# Patient Record
Sex: Male | Born: 1950 | Race: White | Hispanic: No | Marital: Married | Smoking: Former smoker
Health system: Southern US, Community
[De-identification: ages and names within clinical notes are randomized; demographics above are authoritative.]

## PROBLEM LIST (undated history)

## (undated) DIAGNOSIS — M545 Low back pain, unspecified: Secondary | ICD-10-CM

## (undated) DIAGNOSIS — K219 Gastro-esophageal reflux disease without esophagitis: Secondary | ICD-10-CM

## (undated) DIAGNOSIS — M25569 Pain in unspecified knee: Secondary | ICD-10-CM

## (undated) DIAGNOSIS — E785 Hyperlipidemia, unspecified: Secondary | ICD-10-CM

## (undated) DIAGNOSIS — I10 Essential (primary) hypertension: Secondary | ICD-10-CM

## (undated) DIAGNOSIS — B019 Varicella without complication: Secondary | ICD-10-CM

## (undated) DIAGNOSIS — G8929 Other chronic pain: Secondary | ICD-10-CM

## (undated) DIAGNOSIS — I2510 Atherosclerotic heart disease of native coronary artery without angina pectoris: Secondary | ICD-10-CM

## (undated) DIAGNOSIS — E349 Endocrine disorder, unspecified: Secondary | ICD-10-CM

## (undated) DIAGNOSIS — G4733 Obstructive sleep apnea (adult) (pediatric): Secondary | ICD-10-CM

## (undated) DIAGNOSIS — C801 Malignant (primary) neoplasm, unspecified: Secondary | ICD-10-CM

## (undated) DIAGNOSIS — E119 Type 2 diabetes mellitus without complications: Secondary | ICD-10-CM

## (undated) DIAGNOSIS — F32A Depression, unspecified: Secondary | ICD-10-CM

## (undated) DIAGNOSIS — M1990 Unspecified osteoarthritis, unspecified site: Secondary | ICD-10-CM

## (undated) DIAGNOSIS — F419 Anxiety disorder, unspecified: Secondary | ICD-10-CM

## (undated) HISTORY — DX: Essential (primary) hypertension: I10

## (undated) HISTORY — DX: Gastro-esophageal reflux disease without esophagitis: K21.9

## (undated) HISTORY — DX: Other chronic pain: G89.29

## (undated) HISTORY — DX: Varicella without complication: B01.9

## (undated) HISTORY — DX: Low back pain: M54.5

## (undated) HISTORY — DX: Obstructive sleep apnea (adult) (pediatric): G47.33

## (undated) HISTORY — DX: Hyperlipidemia, unspecified: E78.5

## (undated) HISTORY — PX: JOINT REPLACEMENT: SHX530

## (undated) HISTORY — DX: Low back pain, unspecified: M54.50

## (undated) HISTORY — DX: Endocrine disorder, unspecified: E34.9

## (undated) HISTORY — DX: Type 2 diabetes mellitus without complications: E11.9

## (undated) HISTORY — DX: Atherosclerotic heart disease of native coronary artery without angina pectoris: I25.10

## (undated) HISTORY — DX: Pain in unspecified knee: M25.569

## (undated) MED FILL — Dexamethasone Sodium Phosphate Inj 100 MG/10ML: INTRAMUSCULAR | Qty: 1 | Status: AC

---
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
## 2013-10-24 LAB — HM COLONOSCOPY

## 2013-12-15 HISTORY — PX: REPLACEMENT TOTAL KNEE BILATERAL: SUR1225

## 2014-04-05 DIAGNOSIS — E230 Hypopituitarism: Secondary | ICD-10-CM | POA: Insufficient documentation

## 2014-04-27 DIAGNOSIS — Z9889 Other specified postprocedural states: Secondary | ICD-10-CM | POA: Insufficient documentation

## 2015-12-18 DIAGNOSIS — E119 Type 2 diabetes mellitus without complications: Secondary | ICD-10-CM | POA: Diagnosis not present

## 2015-12-18 DIAGNOSIS — I10 Essential (primary) hypertension: Secondary | ICD-10-CM | POA: Diagnosis not present

## 2015-12-18 DIAGNOSIS — R5383 Other fatigue: Secondary | ICD-10-CM | POA: Diagnosis not present

## 2015-12-18 DIAGNOSIS — Z720 Tobacco use: Secondary | ICD-10-CM | POA: Diagnosis not present

## 2015-12-21 DIAGNOSIS — M9903 Segmental and somatic dysfunction of lumbar region: Secondary | ICD-10-CM | POA: Diagnosis not present

## 2015-12-21 DIAGNOSIS — M47817 Spondylosis without myelopathy or radiculopathy, lumbosacral region: Secondary | ICD-10-CM | POA: Diagnosis not present

## 2015-12-25 DIAGNOSIS — M47817 Spondylosis without myelopathy or radiculopathy, lumbosacral region: Secondary | ICD-10-CM | POA: Diagnosis not present

## 2015-12-25 DIAGNOSIS — M9903 Segmental and somatic dysfunction of lumbar region: Secondary | ICD-10-CM | POA: Diagnosis not present

## 2015-12-28 DIAGNOSIS — M9903 Segmental and somatic dysfunction of lumbar region: Secondary | ICD-10-CM | POA: Diagnosis not present

## 2015-12-28 DIAGNOSIS — M47817 Spondylosis without myelopathy or radiculopathy, lumbosacral region: Secondary | ICD-10-CM | POA: Diagnosis not present

## 2015-12-31 DIAGNOSIS — M47817 Spondylosis without myelopathy or radiculopathy, lumbosacral region: Secondary | ICD-10-CM | POA: Diagnosis not present

## 2015-12-31 DIAGNOSIS — M9903 Segmental and somatic dysfunction of lumbar region: Secondary | ICD-10-CM | POA: Diagnosis not present

## 2016-01-04 DIAGNOSIS — M47817 Spondylosis without myelopathy or radiculopathy, lumbosacral region: Secondary | ICD-10-CM | POA: Diagnosis not present

## 2016-01-04 DIAGNOSIS — M9903 Segmental and somatic dysfunction of lumbar region: Secondary | ICD-10-CM | POA: Diagnosis not present

## 2016-01-07 DIAGNOSIS — M47817 Spondylosis without myelopathy or radiculopathy, lumbosacral region: Secondary | ICD-10-CM | POA: Diagnosis not present

## 2016-01-07 DIAGNOSIS — M9903 Segmental and somatic dysfunction of lumbar region: Secondary | ICD-10-CM | POA: Diagnosis not present

## 2016-01-10 DIAGNOSIS — M9903 Segmental and somatic dysfunction of lumbar region: Secondary | ICD-10-CM | POA: Diagnosis not present

## 2016-01-10 DIAGNOSIS — M47817 Spondylosis without myelopathy or radiculopathy, lumbosacral region: Secondary | ICD-10-CM | POA: Diagnosis not present

## 2016-01-17 DIAGNOSIS — M9903 Segmental and somatic dysfunction of lumbar region: Secondary | ICD-10-CM | POA: Diagnosis not present

## 2016-01-17 DIAGNOSIS — M47817 Spondylosis without myelopathy or radiculopathy, lumbosacral region: Secondary | ICD-10-CM | POA: Diagnosis not present

## 2016-01-24 DIAGNOSIS — M9903 Segmental and somatic dysfunction of lumbar region: Secondary | ICD-10-CM | POA: Diagnosis not present

## 2016-01-24 DIAGNOSIS — M47817 Spondylosis without myelopathy or radiculopathy, lumbosacral region: Secondary | ICD-10-CM | POA: Diagnosis not present

## 2016-01-31 DIAGNOSIS — M47817 Spondylosis without myelopathy or radiculopathy, lumbosacral region: Secondary | ICD-10-CM | POA: Diagnosis not present

## 2016-01-31 DIAGNOSIS — M9903 Segmental and somatic dysfunction of lumbar region: Secondary | ICD-10-CM | POA: Diagnosis not present

## 2016-02-08 DIAGNOSIS — M9903 Segmental and somatic dysfunction of lumbar region: Secondary | ICD-10-CM | POA: Diagnosis not present

## 2016-02-08 DIAGNOSIS — M47817 Spondylosis without myelopathy or radiculopathy, lumbosacral region: Secondary | ICD-10-CM | POA: Diagnosis not present

## 2016-02-13 DIAGNOSIS — M9903 Segmental and somatic dysfunction of lumbar region: Secondary | ICD-10-CM | POA: Diagnosis not present

## 2016-02-13 DIAGNOSIS — M47817 Spondylosis without myelopathy or radiculopathy, lumbosacral region: Secondary | ICD-10-CM | POA: Diagnosis not present

## 2016-02-13 DIAGNOSIS — I6523 Occlusion and stenosis of bilateral carotid arteries: Secondary | ICD-10-CM | POA: Diagnosis not present

## 2016-02-13 DIAGNOSIS — R0989 Other specified symptoms and signs involving the circulatory and respiratory systems: Secondary | ICD-10-CM | POA: Diagnosis not present

## 2016-02-18 DIAGNOSIS — G4733 Obstructive sleep apnea (adult) (pediatric): Secondary | ICD-10-CM | POA: Diagnosis not present

## 2016-02-21 DIAGNOSIS — M47817 Spondylosis without myelopathy or radiculopathy, lumbosacral region: Secondary | ICD-10-CM | POA: Diagnosis not present

## 2016-02-21 DIAGNOSIS — M9903 Segmental and somatic dysfunction of lumbar region: Secondary | ICD-10-CM | POA: Diagnosis not present

## 2016-02-28 DIAGNOSIS — M47817 Spondylosis without myelopathy or radiculopathy, lumbosacral region: Secondary | ICD-10-CM | POA: Diagnosis not present

## 2016-02-28 DIAGNOSIS — M9903 Segmental and somatic dysfunction of lumbar region: Secondary | ICD-10-CM | POA: Diagnosis not present

## 2016-03-06 DIAGNOSIS — M47817 Spondylosis without myelopathy or radiculopathy, lumbosacral region: Secondary | ICD-10-CM | POA: Diagnosis not present

## 2016-03-06 DIAGNOSIS — M9903 Segmental and somatic dysfunction of lumbar region: Secondary | ICD-10-CM | POA: Diagnosis not present

## 2016-03-13 DIAGNOSIS — M47817 Spondylosis without myelopathy or radiculopathy, lumbosacral region: Secondary | ICD-10-CM | POA: Diagnosis not present

## 2016-03-13 DIAGNOSIS — M9903 Segmental and somatic dysfunction of lumbar region: Secondary | ICD-10-CM | POA: Diagnosis not present

## 2016-03-20 DIAGNOSIS — M47817 Spondylosis without myelopathy or radiculopathy, lumbosacral region: Secondary | ICD-10-CM | POA: Diagnosis not present

## 2016-03-20 DIAGNOSIS — M9903 Segmental and somatic dysfunction of lumbar region: Secondary | ICD-10-CM | POA: Diagnosis not present

## 2016-03-27 DIAGNOSIS — M47817 Spondylosis without myelopathy or radiculopathy, lumbosacral region: Secondary | ICD-10-CM | POA: Diagnosis not present

## 2016-03-27 DIAGNOSIS — M9903 Segmental and somatic dysfunction of lumbar region: Secondary | ICD-10-CM | POA: Diagnosis not present

## 2016-04-03 DIAGNOSIS — M9903 Segmental and somatic dysfunction of lumbar region: Secondary | ICD-10-CM | POA: Diagnosis not present

## 2016-04-03 DIAGNOSIS — M47817 Spondylosis without myelopathy or radiculopathy, lumbosacral region: Secondary | ICD-10-CM | POA: Diagnosis not present

## 2016-04-10 DIAGNOSIS — M47817 Spondylosis without myelopathy or radiculopathy, lumbosacral region: Secondary | ICD-10-CM | POA: Diagnosis not present

## 2016-04-10 DIAGNOSIS — M9903 Segmental and somatic dysfunction of lumbar region: Secondary | ICD-10-CM | POA: Diagnosis not present

## 2016-04-11 DIAGNOSIS — E1121 Type 2 diabetes mellitus with diabetic nephropathy: Secondary | ICD-10-CM | POA: Diagnosis not present

## 2016-04-11 DIAGNOSIS — F321 Major depressive disorder, single episode, moderate: Secondary | ICD-10-CM | POA: Diagnosis not present

## 2016-04-11 DIAGNOSIS — Z87891 Personal history of nicotine dependence: Secondary | ICD-10-CM | POA: Diagnosis not present

## 2016-04-11 DIAGNOSIS — I10 Essential (primary) hypertension: Secondary | ICD-10-CM | POA: Diagnosis not present

## 2016-04-11 DIAGNOSIS — Z125 Encounter for screening for malignant neoplasm of prostate: Secondary | ICD-10-CM | POA: Diagnosis not present

## 2016-06-03 DIAGNOSIS — E119 Type 2 diabetes mellitus without complications: Secondary | ICD-10-CM | POA: Diagnosis not present

## 2016-06-03 DIAGNOSIS — E1121 Type 2 diabetes mellitus with diabetic nephropathy: Secondary | ICD-10-CM | POA: Diagnosis not present

## 2016-06-03 DIAGNOSIS — Z87891 Personal history of nicotine dependence: Secondary | ICD-10-CM | POA: Diagnosis not present

## 2016-06-03 DIAGNOSIS — E875 Hyperkalemia: Secondary | ICD-10-CM | POA: Diagnosis not present

## 2016-06-03 DIAGNOSIS — I10 Essential (primary) hypertension: Secondary | ICD-10-CM | POA: Diagnosis not present

## 2016-06-03 DIAGNOSIS — E538 Deficiency of other specified B group vitamins: Secondary | ICD-10-CM | POA: Diagnosis not present

## 2016-06-10 DIAGNOSIS — I10 Essential (primary) hypertension: Secondary | ICD-10-CM | POA: Diagnosis not present

## 2016-06-18 DIAGNOSIS — Z471 Aftercare following joint replacement surgery: Secondary | ICD-10-CM | POA: Diagnosis not present

## 2016-06-18 DIAGNOSIS — Z96653 Presence of artificial knee joint, bilateral: Secondary | ICD-10-CM | POA: Diagnosis not present

## 2016-06-18 DIAGNOSIS — Z9889 Other specified postprocedural states: Secondary | ICD-10-CM | POA: Diagnosis not present

## 2016-06-24 DIAGNOSIS — I10 Essential (primary) hypertension: Secondary | ICD-10-CM | POA: Diagnosis not present

## 2016-06-24 DIAGNOSIS — L989 Disorder of the skin and subcutaneous tissue, unspecified: Secondary | ICD-10-CM | POA: Diagnosis not present

## 2016-07-16 DIAGNOSIS — G4733 Obstructive sleep apnea (adult) (pediatric): Secondary | ICD-10-CM | POA: Diagnosis not present

## 2016-08-11 DIAGNOSIS — M47817 Spondylosis without myelopathy or radiculopathy, lumbosacral region: Secondary | ICD-10-CM | POA: Diagnosis not present

## 2016-08-11 DIAGNOSIS — M9903 Segmental and somatic dysfunction of lumbar region: Secondary | ICD-10-CM | POA: Diagnosis not present

## 2016-08-14 DIAGNOSIS — M47817 Spondylosis without myelopathy or radiculopathy, lumbosacral region: Secondary | ICD-10-CM | POA: Diagnosis not present

## 2016-08-14 DIAGNOSIS — M9903 Segmental and somatic dysfunction of lumbar region: Secondary | ICD-10-CM | POA: Diagnosis not present

## 2016-08-20 DIAGNOSIS — M47817 Spondylosis without myelopathy or radiculopathy, lumbosacral region: Secondary | ICD-10-CM | POA: Diagnosis not present

## 2016-08-20 DIAGNOSIS — M9903 Segmental and somatic dysfunction of lumbar region: Secondary | ICD-10-CM | POA: Diagnosis not present

## 2016-08-22 DIAGNOSIS — M9903 Segmental and somatic dysfunction of lumbar region: Secondary | ICD-10-CM | POA: Diagnosis not present

## 2016-08-22 DIAGNOSIS — M47817 Spondylosis without myelopathy or radiculopathy, lumbosacral region: Secondary | ICD-10-CM | POA: Diagnosis not present

## 2016-08-25 DIAGNOSIS — M9903 Segmental and somatic dysfunction of lumbar region: Secondary | ICD-10-CM | POA: Diagnosis not present

## 2016-08-25 DIAGNOSIS — M47817 Spondylosis without myelopathy or radiculopathy, lumbosacral region: Secondary | ICD-10-CM | POA: Diagnosis not present

## 2016-08-27 DIAGNOSIS — Z1211 Encounter for screening for malignant neoplasm of colon: Secondary | ICD-10-CM | POA: Diagnosis not present

## 2016-08-27 DIAGNOSIS — R413 Other amnesia: Secondary | ICD-10-CM | POA: Diagnosis not present

## 2016-08-27 DIAGNOSIS — E291 Testicular hypofunction: Secondary | ICD-10-CM | POA: Diagnosis not present

## 2016-08-27 DIAGNOSIS — I10 Essential (primary) hypertension: Secondary | ICD-10-CM | POA: Diagnosis not present

## 2016-08-27 DIAGNOSIS — Z23 Encounter for immunization: Secondary | ICD-10-CM | POA: Diagnosis not present

## 2016-08-27 DIAGNOSIS — E1121 Type 2 diabetes mellitus with diabetic nephropathy: Secondary | ICD-10-CM | POA: Diagnosis not present

## 2016-08-27 DIAGNOSIS — Z87891 Personal history of nicotine dependence: Secondary | ICD-10-CM | POA: Diagnosis not present

## 2016-08-28 DIAGNOSIS — M9903 Segmental and somatic dysfunction of lumbar region: Secondary | ICD-10-CM | POA: Diagnosis not present

## 2016-08-28 DIAGNOSIS — M47817 Spondylosis without myelopathy or radiculopathy, lumbosacral region: Secondary | ICD-10-CM | POA: Diagnosis not present

## 2016-09-02 DIAGNOSIS — M47817 Spondylosis without myelopathy or radiculopathy, lumbosacral region: Secondary | ICD-10-CM | POA: Diagnosis not present

## 2016-09-02 DIAGNOSIS — M9903 Segmental and somatic dysfunction of lumbar region: Secondary | ICD-10-CM | POA: Diagnosis not present

## 2016-09-04 DIAGNOSIS — M47817 Spondylosis without myelopathy or radiculopathy, lumbosacral region: Secondary | ICD-10-CM | POA: Diagnosis not present

## 2016-09-04 DIAGNOSIS — M9903 Segmental and somatic dysfunction of lumbar region: Secondary | ICD-10-CM | POA: Diagnosis not present

## 2016-09-08 DIAGNOSIS — M9903 Segmental and somatic dysfunction of lumbar region: Secondary | ICD-10-CM | POA: Diagnosis not present

## 2016-09-08 DIAGNOSIS — M47817 Spondylosis without myelopathy or radiculopathy, lumbosacral region: Secondary | ICD-10-CM | POA: Diagnosis not present

## 2016-09-12 DIAGNOSIS — M47817 Spondylosis without myelopathy or radiculopathy, lumbosacral region: Secondary | ICD-10-CM | POA: Diagnosis not present

## 2016-09-12 DIAGNOSIS — M9903 Segmental and somatic dysfunction of lumbar region: Secondary | ICD-10-CM | POA: Diagnosis not present

## 2016-09-16 DIAGNOSIS — M47817 Spondylosis without myelopathy or radiculopathy, lumbosacral region: Secondary | ICD-10-CM | POA: Diagnosis not present

## 2016-09-16 DIAGNOSIS — M9903 Segmental and somatic dysfunction of lumbar region: Secondary | ICD-10-CM | POA: Diagnosis not present

## 2016-09-19 DIAGNOSIS — M47817 Spondylosis without myelopathy or radiculopathy, lumbosacral region: Secondary | ICD-10-CM | POA: Diagnosis not present

## 2016-09-19 DIAGNOSIS — M9903 Segmental and somatic dysfunction of lumbar region: Secondary | ICD-10-CM | POA: Diagnosis not present

## 2016-09-22 DIAGNOSIS — M47817 Spondylosis without myelopathy or radiculopathy, lumbosacral region: Secondary | ICD-10-CM | POA: Diagnosis not present

## 2016-09-22 DIAGNOSIS — M9903 Segmental and somatic dysfunction of lumbar region: Secondary | ICD-10-CM | POA: Diagnosis not present

## 2016-09-26 DIAGNOSIS — M9903 Segmental and somatic dysfunction of lumbar region: Secondary | ICD-10-CM | POA: Diagnosis not present

## 2016-09-26 DIAGNOSIS — M47817 Spondylosis without myelopathy or radiculopathy, lumbosacral region: Secondary | ICD-10-CM | POA: Diagnosis not present

## 2016-10-01 DIAGNOSIS — M47817 Spondylosis without myelopathy or radiculopathy, lumbosacral region: Secondary | ICD-10-CM | POA: Diagnosis not present

## 2016-10-01 DIAGNOSIS — M9903 Segmental and somatic dysfunction of lumbar region: Secondary | ICD-10-CM | POA: Diagnosis not present

## 2016-10-09 DIAGNOSIS — M47817 Spondylosis without myelopathy or radiculopathy, lumbosacral region: Secondary | ICD-10-CM | POA: Diagnosis not present

## 2016-10-09 DIAGNOSIS — M9903 Segmental and somatic dysfunction of lumbar region: Secondary | ICD-10-CM | POA: Diagnosis not present

## 2016-10-15 DIAGNOSIS — M47817 Spondylosis without myelopathy or radiculopathy, lumbosacral region: Secondary | ICD-10-CM | POA: Diagnosis not present

## 2016-10-15 DIAGNOSIS — M9903 Segmental and somatic dysfunction of lumbar region: Secondary | ICD-10-CM | POA: Diagnosis not present

## 2016-10-21 DIAGNOSIS — M9903 Segmental and somatic dysfunction of lumbar region: Secondary | ICD-10-CM | POA: Diagnosis not present

## 2016-10-21 DIAGNOSIS — M47817 Spondylosis without myelopathy or radiculopathy, lumbosacral region: Secondary | ICD-10-CM | POA: Diagnosis not present

## 2017-01-02 DIAGNOSIS — F321 Major depressive disorder, single episode, moderate: Secondary | ICD-10-CM | POA: Diagnosis not present

## 2017-01-02 DIAGNOSIS — I10 Essential (primary) hypertension: Secondary | ICD-10-CM | POA: Diagnosis not present

## 2017-01-02 DIAGNOSIS — E1121 Type 2 diabetes mellitus with diabetic nephropathy: Secondary | ICD-10-CM | POA: Diagnosis not present

## 2017-01-02 DIAGNOSIS — M1990 Unspecified osteoarthritis, unspecified site: Secondary | ICD-10-CM | POA: Diagnosis not present

## 2017-01-29 DIAGNOSIS — I10 Essential (primary) hypertension: Secondary | ICD-10-CM | POA: Diagnosis not present

## 2017-01-29 DIAGNOSIS — M19071 Primary osteoarthritis, right ankle and foot: Secondary | ICD-10-CM | POA: Diagnosis not present

## 2017-01-29 DIAGNOSIS — M799 Soft tissue disorder, unspecified: Secondary | ICD-10-CM | POA: Diagnosis not present

## 2017-01-29 DIAGNOSIS — S99921A Unspecified injury of right foot, initial encounter: Secondary | ICD-10-CM | POA: Diagnosis not present

## 2017-01-29 DIAGNOSIS — Z87891 Personal history of nicotine dependence: Secondary | ICD-10-CM | POA: Diagnosis not present

## 2017-01-29 DIAGNOSIS — S9781XA Crushing injury of right foot, initial encounter: Secondary | ICD-10-CM | POA: Diagnosis not present

## 2017-01-29 DIAGNOSIS — E119 Type 2 diabetes mellitus without complications: Secondary | ICD-10-CM | POA: Diagnosis not present

## 2017-01-29 DIAGNOSIS — Z888 Allergy status to other drugs, medicaments and biological substances status: Secondary | ICD-10-CM | POA: Diagnosis not present

## 2017-01-29 DIAGNOSIS — M7989 Other specified soft tissue disorders: Secondary | ICD-10-CM | POA: Diagnosis not present

## 2017-03-28 DIAGNOSIS — H66002 Acute suppurative otitis media without spontaneous rupture of ear drum, left ear: Secondary | ICD-10-CM | POA: Diagnosis not present

## 2017-03-28 DIAGNOSIS — J209 Acute bronchitis, unspecified: Secondary | ICD-10-CM | POA: Diagnosis not present

## 2017-05-01 DIAGNOSIS — R42 Dizziness and giddiness: Secondary | ICD-10-CM | POA: Diagnosis not present

## 2017-05-01 DIAGNOSIS — M6249 Contracture of muscle, multiple sites: Secondary | ICD-10-CM | POA: Diagnosis not present

## 2017-05-01 DIAGNOSIS — M5417 Radiculopathy, lumbosacral region: Secondary | ICD-10-CM | POA: Diagnosis not present

## 2017-05-01 DIAGNOSIS — R293 Abnormal posture: Secondary | ICD-10-CM | POA: Diagnosis not present

## 2017-05-01 DIAGNOSIS — I10 Essential (primary) hypertension: Secondary | ICD-10-CM | POA: Diagnosis not present

## 2017-05-01 DIAGNOSIS — M79672 Pain in left foot: Secondary | ICD-10-CM | POA: Diagnosis not present

## 2017-05-01 DIAGNOSIS — M545 Low back pain: Secondary | ICD-10-CM | POA: Diagnosis not present

## 2017-05-01 DIAGNOSIS — E1121 Type 2 diabetes mellitus with diabetic nephropathy: Secondary | ICD-10-CM | POA: Diagnosis not present

## 2017-05-04 DIAGNOSIS — M6249 Contracture of muscle, multiple sites: Secondary | ICD-10-CM | POA: Diagnosis not present

## 2017-05-04 DIAGNOSIS — M545 Low back pain: Secondary | ICD-10-CM | POA: Diagnosis not present

## 2017-05-04 DIAGNOSIS — M5417 Radiculopathy, lumbosacral region: Secondary | ICD-10-CM | POA: Diagnosis not present

## 2017-05-04 DIAGNOSIS — R293 Abnormal posture: Secondary | ICD-10-CM | POA: Diagnosis not present

## 2017-05-05 DIAGNOSIS — R05 Cough: Secondary | ICD-10-CM | POA: Diagnosis not present

## 2017-05-05 DIAGNOSIS — J189 Pneumonia, unspecified organism: Secondary | ICD-10-CM | POA: Diagnosis not present

## 2017-05-12 DIAGNOSIS — M545 Low back pain: Secondary | ICD-10-CM | POA: Diagnosis not present

## 2017-05-12 DIAGNOSIS — M6249 Contracture of muscle, multiple sites: Secondary | ICD-10-CM | POA: Diagnosis not present

## 2017-05-12 DIAGNOSIS — R293 Abnormal posture: Secondary | ICD-10-CM | POA: Diagnosis not present

## 2017-05-12 DIAGNOSIS — M5417 Radiculopathy, lumbosacral region: Secondary | ICD-10-CM | POA: Diagnosis not present

## 2017-05-15 DIAGNOSIS — R293 Abnormal posture: Secondary | ICD-10-CM | POA: Diagnosis not present

## 2017-05-15 DIAGNOSIS — M545 Low back pain: Secondary | ICD-10-CM | POA: Diagnosis not present

## 2017-05-15 DIAGNOSIS — M6249 Contracture of muscle, multiple sites: Secondary | ICD-10-CM | POA: Diagnosis not present

## 2017-05-15 DIAGNOSIS — M5417 Radiculopathy, lumbosacral region: Secondary | ICD-10-CM | POA: Diagnosis not present

## 2017-05-18 DIAGNOSIS — M5417 Radiculopathy, lumbosacral region: Secondary | ICD-10-CM | POA: Diagnosis not present

## 2017-05-18 DIAGNOSIS — M6249 Contracture of muscle, multiple sites: Secondary | ICD-10-CM | POA: Diagnosis not present

## 2017-05-18 DIAGNOSIS — M545 Low back pain: Secondary | ICD-10-CM | POA: Diagnosis not present

## 2017-05-18 DIAGNOSIS — R293 Abnormal posture: Secondary | ICD-10-CM | POA: Diagnosis not present

## 2017-05-21 DIAGNOSIS — M5417 Radiculopathy, lumbosacral region: Secondary | ICD-10-CM | POA: Diagnosis not present

## 2017-05-21 DIAGNOSIS — M545 Low back pain: Secondary | ICD-10-CM | POA: Diagnosis not present

## 2017-05-21 DIAGNOSIS — M6249 Contracture of muscle, multiple sites: Secondary | ICD-10-CM | POA: Diagnosis not present

## 2017-05-21 DIAGNOSIS — R293 Abnormal posture: Secondary | ICD-10-CM | POA: Diagnosis not present

## 2017-05-25 DIAGNOSIS — M6249 Contracture of muscle, multiple sites: Secondary | ICD-10-CM | POA: Diagnosis not present

## 2017-05-25 DIAGNOSIS — M5417 Radiculopathy, lumbosacral region: Secondary | ICD-10-CM | POA: Diagnosis not present

## 2017-05-25 DIAGNOSIS — M545 Low back pain: Secondary | ICD-10-CM | POA: Diagnosis not present

## 2017-05-25 DIAGNOSIS — R293 Abnormal posture: Secondary | ICD-10-CM | POA: Diagnosis not present

## 2017-05-28 DIAGNOSIS — M5417 Radiculopathy, lumbosacral region: Secondary | ICD-10-CM | POA: Diagnosis not present

## 2017-05-28 DIAGNOSIS — M6249 Contracture of muscle, multiple sites: Secondary | ICD-10-CM | POA: Diagnosis not present

## 2017-05-28 DIAGNOSIS — M545 Low back pain: Secondary | ICD-10-CM | POA: Diagnosis not present

## 2017-05-28 DIAGNOSIS — R293 Abnormal posture: Secondary | ICD-10-CM | POA: Diagnosis not present

## 2017-06-01 DIAGNOSIS — E119 Type 2 diabetes mellitus without complications: Secondary | ICD-10-CM | POA: Diagnosis not present

## 2017-06-01 DIAGNOSIS — I10 Essential (primary) hypertension: Secondary | ICD-10-CM | POA: Diagnosis not present

## 2017-06-01 DIAGNOSIS — E1121 Type 2 diabetes mellitus with diabetic nephropathy: Secondary | ICD-10-CM | POA: Diagnosis not present

## 2017-06-03 DIAGNOSIS — R55 Syncope and collapse: Secondary | ICD-10-CM | POA: Diagnosis not present

## 2017-09-09 ENCOUNTER — Ambulatory Visit (INDEPENDENT_AMBULATORY_CARE_PROVIDER_SITE_OTHER): Payer: Medicare Other | Admitting: Primary Care

## 2017-09-09 ENCOUNTER — Encounter: Payer: Self-pay | Admitting: Primary Care

## 2017-09-09 ENCOUNTER — Ambulatory Visit (INDEPENDENT_AMBULATORY_CARE_PROVIDER_SITE_OTHER)
Admission: RE | Admit: 2017-09-09 | Discharge: 2017-09-09 | Disposition: A | Discharge status: Home / Self Care | Payer: Medicare Other | Source: Ambulatory Visit | Attending: Primary Care | Admitting: Primary Care

## 2017-09-09 ENCOUNTER — Other Ambulatory Visit: Payer: Self-pay | Admitting: Primary Care

## 2017-09-09 VITALS — BP 124/74 | HR 74 | Temp 98.2°F | Ht 68.0 in | Wt 287.0 lb

## 2017-09-09 DIAGNOSIS — M5136 Other intervertebral disc degeneration, lumbar region: Secondary | ICD-10-CM

## 2017-09-09 DIAGNOSIS — G8929 Other chronic pain: Secondary | ICD-10-CM | POA: Insufficient documentation

## 2017-09-09 DIAGNOSIS — E349 Endocrine disorder, unspecified: Secondary | ICD-10-CM | POA: Diagnosis not present

## 2017-09-09 DIAGNOSIS — G4733 Obstructive sleep apnea (adult) (pediatric): Secondary | ICD-10-CM

## 2017-09-09 DIAGNOSIS — M47816 Spondylosis without myelopathy or radiculopathy, lumbar region: Secondary | ICD-10-CM | POA: Diagnosis not present

## 2017-09-09 DIAGNOSIS — M545 Low back pain, unspecified: Secondary | ICD-10-CM

## 2017-09-09 DIAGNOSIS — K219 Gastro-esophageal reflux disease without esophagitis: Secondary | ICD-10-CM | POA: Insufficient documentation

## 2017-09-09 DIAGNOSIS — I10 Essential (primary) hypertension: Secondary | ICD-10-CM

## 2017-09-09 DIAGNOSIS — E119 Type 2 diabetes mellitus without complications: Secondary | ICD-10-CM

## 2017-09-09 DIAGNOSIS — E118 Type 2 diabetes mellitus with unspecified complications: Secondary | ICD-10-CM | POA: Insufficient documentation

## 2017-09-09 LAB — COMPREHENSIVE METABOLIC PANEL
ALT: 14 U/L (ref 0–53)
AST: 14 U/L (ref 0–37)
Albumin: 4.2 g/dL (ref 3.5–5.2)
Alkaline Phosphatase: 75 U/L (ref 39–117)
BUN: 19 mg/dL (ref 6–23)
CHLORIDE: 101 meq/L (ref 96–112)
CO2: 31 mEq/L (ref 19–32)
Calcium: 9.5 mg/dL (ref 8.4–10.5)
Creatinine, Ser: 1.29 mg/dL (ref 0.40–1.50)
GFR: 59.01 mL/min — ABNORMAL LOW (ref >60.00)
GLUCOSE: 187 mg/dL — AB (ref 70–99)
POTASSIUM: 5 meq/L (ref 3.5–5.1)
SODIUM: 138 meq/L (ref 135–145)
Total Bilirubin: 0.3 mg/dL (ref 0.2–1.2)
Total Protein: 6.6 g/dL (ref 6.0–8.3)

## 2017-09-09 LAB — HEMOGLOBIN A1C: Hgb A1c MFr Bld: 8.2 % — ABNORMAL HIGH (ref 4.6–6.5)

## 2017-09-09 NOTE — Assessment & Plan Note (Signed)
Compliant to CPAP. Referral placed to pulmonology for establishment.

## 2017-09-09 NOTE — Assessment & Plan Note (Signed)
Referral placed to Urology for further management. Continue Sildenafil PRN.

## 2017-09-09 NOTE — Assessment & Plan Note (Signed)
Stable in the office today, continue lisinopril 5 mg and amlodipine 10 mg. BMP pending.

## 2017-09-09 NOTE — Progress Notes (Signed)
Subjective:    Patient ID: Adam Wise, male    DOB: 1951/06/13, 66 y.o.   MRN: [id number]  HPI  Adam Wise is a 66 year old male who presents today to establish care and discuss the problems mentioned below. Will obtain old records.  1) Essential Hypertension: Currently managed on Amlodipine 10 mg and lisinopril 5 mg. Previously managed on HCTZ and lisinopril 40 mg. He does not check his BP at home. Denies chest pain, dizziness, visual changes.   2) Type 2 Diabetes: Diagnosed several years ago. Currently managed on glipizide 10 mg BID, Actos 45 mg once daily, and Metformin 1000 mg BID. His last A1C was 10.1 three months ago. Actos 45 mg was added during his last visit with his PCP three months ago. He does not check his blood sugar.   3) Testosterone Deficiency: Diagnosed several years ago. Currently managed on IM testosterone.   4) OSA: Diagnosed several years ago. Currently compliant to his CPAP.   5) Chronic Back Pain: Located to the upper lumbar spine with radiation to bilateral lower lumbar spine. History of sciatica. Underwent xray which showed an older fracture (calcifications). Has completed physical therapy, massage therapy, and chiropractor. No recent imaging. Denies radiculopathy.   Review of Systems  Constitutional: Negative for unexpected weight change.  Respiratory: Negative for shortness of breath.   Cardiovascular: Negative for chest pain.  Gastrointestinal:       Gerd  Genitourinary:       Testosterone deficiency  Musculoskeletal: Positive for back pain.       Chronic knee pain  Neurological: Negative for numbness.       Past Medical History:  Diagnosis Date  . Chickenpox   . Essential hypertension   . Testosterone deficiency   . Type 2 diabetes mellitus ([place])      Social History   Social History  . Marital status: Married    Spouse name: N/A  . Number of children: N/A  . Years of education: N/A   Occupational History  . Not on file.   Social  History Main Topics  . Smoking status: Former Research scientist (life sciences)  . Smokeless tobacco: Never Used  . Alcohol use Yes  . Drug use: Unknown  . Sexual activity: Not on file   Other Topics Concern  . Not on file   Social History Narrative  . No narrative on file    Past Surgical History:  Procedure Laterality Date  . REPLACEMENT TOTAL KNEE BILATERAL  2015  . TONSILLECTOMY  1960    Family History  Problem Relation Age of Onset  . Cancer Mother   . Hypertension Mother   . Arthritis Father   . Asthma Father   . Cancer Father   . COPD Father   . Heart attack Father   . Hypertension Sister   . Cancer Sister   . Diabetes Sister   . Asthma Son   . Birth defects Maternal Grandfather   . Arthritis Paternal Grandmother   . Diabetes Paternal Grandmother   . Arthritis Paternal Grandfather   . Asthma Sister   . Cancer Sister   . COPD Sister   . Arthritis Sister   . Asthma Sister   . Diabetes Sister     Allergies  Allergen Reactions  . Bupropion     Racing heart    No current outpatient prescriptions on file prior to visit.   No current facility-administered medications on file prior to visit.     BP 124/74  Pulse 74   Temp 98.2 F (36.8 C) (Oral)   Ht 5\' 8"  (1.727 m)   Wt 287 lb (130.2 kg)   SpO2 96%   BMI 43.64 kg/m    Objective:   Physical Exam  Constitutional: He is oriented to person, place, and time. He appears well-nourished.  Neck: Neck supple.  Cardiovascular: Normal rate and regular rhythm.   Pulmonary/Chest: Effort normal and breath sounds normal. He has no wheezes. He has no rales.  Musculoskeletal:  Chronic decrease in ROM with stiffness to right knee. Chronic knee pain. Chronic back pain.  Neurological: He is alert and oriented to person, place, and time.  Skin: Skin is warm and dry.  Psychiatric: He has a normal mood and affect.          Assessment & Plan:

## 2017-09-09 NOTE — Assessment & Plan Note (Signed)
Diagnosed years ago. Last A1C above goal at 10.1, repeat A1C pending. Continue Glipizide, metformin, and Actos for now. May require Lantus if no improvement. Managed on ACE, no statin.

## 2017-09-09 NOTE — Assessment & Plan Note (Signed)
Stable on Nexium, continue same.

## 2017-09-09 NOTE — Patient Instructions (Signed)
Complete xray(s) and labs prior to leaving today. I will notify you of your results once received.  You will be contacted regarding your referral to Pulmonology, physical therapy, and Urology.  Please let us know if you have not heard back within one week.   I'll be in touch in regards to your follow up visit based off of your labs today.  It was a pleasure to meet you today! Please don't hesitate to call me with any questions. Welcome to [facility]!

## 2017-09-09 NOTE — Assessment & Plan Note (Signed)
Also with chronic knee pain. Exam today overall stable. Check lumbar xray today. Will send to PT once xray results return.

## 2017-09-10 ENCOUNTER — Encounter: Payer: Self-pay | Admitting: *Deleted

## 2017-09-15 DIAGNOSIS — Z23 Encounter for immunization: Secondary | ICD-10-CM | POA: Diagnosis not present

## 2017-09-16 ENCOUNTER — Encounter: Payer: Self-pay | Admitting: Primary Care

## 2017-09-16 ENCOUNTER — Ambulatory Visit: Payer: Medicare Other | Attending: Primary Care

## 2017-09-16 DIAGNOSIS — R2689 Other abnormalities of gait and mobility: Secondary | ICD-10-CM | POA: Insufficient documentation

## 2017-09-16 DIAGNOSIS — M545 Low back pain, unspecified: Secondary | ICD-10-CM

## 2017-09-16 DIAGNOSIS — G8929 Other chronic pain: Secondary | ICD-10-CM | POA: Insufficient documentation

## 2017-09-16 DIAGNOSIS — M5386 Other specified dorsopathies, lumbar region: Secondary | ICD-10-CM | POA: Diagnosis not present

## 2017-09-16 NOTE — Therapy (Signed)
[place] [facility]IN [facility] SERVICES [address] [place], [place], [address] Phone: [phone]   Fax:  [phone]  Physical Therapy Evaluation  Patient Details  Name: Adam Wise MRN: [id number] Date of Birth: 07-18-1951 Referring Provider: [name], NP  Encounter Date: 09/16/2017      PT End of Session - 09/16/17 1524    Visit Number 1   Number of Visits 16   Date for PT Re-Evaluation 11/11/17   Authorization - Visit Number 1   Authorization - Number of Visits 10   PT Start Time 1400   PT Stop Time 1509   PT Time Calculation (min) 69 min   Equipment Utilized During Treatment Gait belt   Activity Tolerance Patient tolerated treatment well;Patient limited by pain   Behavior During Therapy [facility] for tasks assessed/performed;Impulsive      Past Medical History:  Diagnosis Date  . Chickenpox   . Chronic knee pain   . Chronic low back pain   . Essential hypertension   . GERD (gastroesophageal reflux disease)   . OSA (obstructive sleep apnea)   . Testosterone deficiency   . Type 2 diabetes mellitus ([place])     Past Surgical History:  Procedure Laterality Date  . REPLACEMENT TOTAL KNEE BILATERAL  2015  . TONSILLECTOMY  1960    There were no vitals filed for this visit.       Subjective Assessment - 09/16/17 1423    Subjective Patient is a pleasant 66 year old male who presents to physical therapy for chronic low back pain and degenerative joint disease    Patient is accompained by: Family member   Pertinent History Patient is a pleasant 66 year old male who presents to physical therapy for low back pain. Had PT 6 months ago for his back in [place]. Reports having sciatica two years ago in 2016, went away with acupuncture. Right IT band has been tight ever since 2016. Pain occurs across upper lumbar and will spread to between shoulder blades with occasional tingling in fingers when sleeping on back at night. Pt. 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    Limitations Sitting;Lifting;Standing;Walking;House hold activities   How long can you sit comfortably? getting up from sitting is bothersome   How long can you stand comfortably? half hour   How long can you walk comfortably? 15 minutes   Diagnostic tests X ray   Patient Stated Goals be able to pick things back up   Currently in Pain? Yes   Pain Score 3    Pain Location Back   Pain Orientation Lower;Mid   Pain Descriptors / Indicators Grimacing;Moaning;Radiating;Sharp;Throbbing   Pain Type Chronic pain   Pain Radiating Towards out into hands    Pain Onset More than a month ago   Pain Frequency Constant   Aggravating Factors  laying on back, standing still,    Pain Relieving Factors hot shower, walking   Effect of Pain on Daily Activities can't bend to pick things up              [facility] PT Assessment - 09/16/17 0001      Assessment   Medical Diagnosis chornic bilat low back pain   Referring Provider [name], NP   Onset Date/Surgical Date 09/16/13   Hand Dominance Left  Next MD Visit patient not sure   Prior Therapy yes     Precautions   Precautions Knee   Precaution Comments no running, no high impact on knees     Restrictions   Weight Bearing Restrictions No     Balance Screen   Has the patient fallen in the past 6 months No   Has the patient had a decrease in activity level because of a fear of falling?  Yes   Is the patient reluctant to leave their home because of a fear of falling?  No     Home Environment   Living Environment Private residence  living with daughter   Living Arrangements Spouse/significant other   Available Help at Discharge Family   Type of [place] to enter   Entrance Stairs-Number of Steps 3   Entrance Stairs-Rails  Cannot reach both;Right;Left   [place] One level     Prior Function   Level of Independence Independent with basic ADLs   Vocation Retired     Associate Professor   Overall Cognitive Status Within Functional Limits for tasks assessed     Sensation   Light Touch Appears Intact     Coordination   Gross Motor Movements are Fluid and Coordinated Yes     Posture/Postural Control   Posture/Postural Control Postural limitations   Postural Limitations Rounded Shoulders;Forward head;Decreased lumbar lordosis     Transfers   Transfers Sit to Stand;Supine to Sit;Sit to Supine;Stand to Sit   Sit to Stand 7: Independent   Stand to Sit 6: Modified independent (Device/Increase time)   Supine to Sit 6: Modified independent (Device/Increase time)   Sit to Supine 6: Modified independent (Device/Increase time)     Ambulation/Gait   Ambulation/Gait Yes   Assistive device None   Gait Pattern Decreased hip/knee flexion - right;Decreased stride length;Decreased trunk rotation;Decreased arm swing - right;Decreased arm swing - left   Ambulation Surface Level;Indoor   Gait velocity 1.55m/s        PAIN: Current 3/10 Worst 8/10 Average : 5/10 POSTURE: Forward head rounded shoulders, decreased lumbar curvature,   PROM/AROM: Trunk Flexion WFL  Trunk Extension Painful with repetition  Trunk R SB Limited nonpainful  Trunk L SB Limited and painful   Trunk R rotation Thoracic compensation  Trunk L rotation Thoracic compensation   Hamstrings limited bilaterally  IT bands limited bilaterally (R>L)   STRENGTH:  Graded on a 0-5 scale Muscle Group Left Right  Shoulder flex    Shoulder Abd    Shoulder Ext    Shoulder IR/ER    Elbow    Wrist/hand    Hip Flex 4+/5 4/5  Hip Abd 4+/5 4-/5  Hip Add 4+/5 4/5  Hip Ext 4/5 4-/5  Hip IR/ER 4/5 4/5  Knee Flex 4/5 4/5  Knee Ext 4+/5 4/5  Ankle DF 4+/5 4/5  Ankle PF 4+/5 4/5   SENSATION: WFL  SPECIAL TESTS: Slump test - SLR + +  FAIR  FUNCTIONAL MOBILITY: Pain limits   GAIT: Decreased trunk and arm rotation, decreased step length  OUTCOME MEASURES: TEST Outcome Interpretation  5 times sit<>stand 18 sec >60 yo, >15 sec indicates increased risk for falls  10 meter walk test                 1.4  m/s <1.0 m/s indicates increased risk for falls; limited community ambulator  MODI= 22%  Mod Disability  FABQ 19/30, 23/66; 42/96  FABQPA: 15= fearful  for physical activity     Treat Single knee to chest with towel LE rotation for lumbar ROM Seated hamstring stretch Ankle pumps     Objective measurements completed on examination: See above findings.                  PT Education - 09/16/17 1523    Education provided Yes   Education Details HEP, education on low back pain   Person(s) Educated Patient   Methods Explanation;Demonstration;Verbal cues   Comprehension Verbalized understanding;Returned demonstration          PT Short Term Goals - 09/16/17 1550      PT SHORT TERM GOAL #1   Title Patient will be independent in home exercise program to improve strength/mobility for better functional independence with ADLs.   Baseline HEP given   Time 2   Period Weeks   Status New   Target Date 09/30/17     PT SHORT TERM GOAL #2   Title Patient's average low back pain will decrease to 3/10 to improve quality of life.   Baseline 10/3: average pain is 5/10   Time 2   Period Weeks   Status New   Target Date 09/30/17           PT Long Term Goals - 09/16/17 1551      PT LONG TERM GOAL #1   Title Patient will report a worst pain of 3/10 on VAS in lumbar region to improve tolerance with ADLs and reduced symptoms with activities   Baseline 10/3: 8/10   Time 8   Period Weeks   Status New   Target Date 11/11/17     PT LONG TERM GOAL #2   Title Patient will reduce modified Oswestry score to <20 as to demonstrate minimal disability with ADLs including improved sleeping tolerance,  walking/sitting tolerance etc for better mobility with ADLs.    Baseline 10/3: 22%   Time 8   Period Weeks   Status New   Target Date 11/11/17     PT LONG TERM GOAL #3   Title Patient will decrease FABQ to 30/96 to demonstrate decreased disability and fear of movement fo rimproved quality of life.    Baseline 10/3: 42/96   Time 8   Period Weeks   Status New   Target Date 11/11/17     PT LONG TERM GOAL #4   Title Patient will be able to perform household work/ chores without increase in symptoms.   Baseline 10/3: pain with movement   Time 8   Period Weeks   Status New   Target Date 11/11/17                Plan - 09/16/17 1536    Clinical Impression Statement Patient is a pleasant 66 year old man who presents to physical therapy evaluation for chronic low back pain. Pt. Has a history of chronic low back pain and IT band dysfunction limiting quality of life. Reduced lumbar ROM noted with pain upon extension and side bending. Preference towards flexion noted upon movement testing. Tight LE musculature positive for exacerbating low back pain. 5x STS=18 seconds, ambulation is functional however has limited rotation of trunk with a functional gait speed of 1.60m/s, MODI=22% demonstrating mod perceived disability, FABQ=42/96 with FABQPA =15 indicating fear of physical activity. Patient was educated on HEP and underlying condition. Patient's symptoms and examination is consistent with mechanical and degenerative low back pain with additional musculoskeletal tightness and weakened trunk. Patient  will benefit from skilled physical therapy to decrease pain and improve patient's quality of life.     History and Personal Factors relevant to plan of care: This patient presents with3, personal factors/ comorbidities xxx, and,3 body elements including body structures and functions, activity limitations and or participation restrictions. Patient's condition is stable.   Clinical Presentation Stable    Clinical Presentation due to: chronic condition of low back pain   Clinical Decision Making Moderate   Rehab Potential Good   Clinical Impairments Affecting Rehab Potential (-) HTN, reflux, bilateral TKA, diabetes, history of chronic back pain,recent move (+) good family support and understanding of medical field   PT Frequency 2x / week   PT Duration 8 weeks   PT Treatment/Interventions ADLs/Self Care Home Management;Aquatic Therapy;Cryotherapy;Electrical Stimulation;Iontophoresis 4mg /ml Dexamethasone;Moist Heat;Traction;Ultrasound;DME Instruction;Balance training;Therapeutic exercise;Therapeutic activities;Functional mobility training;Stair training;Gait training;Neuromuscular re-education;Patient/family education;Manual techniques;Passive range of motion;Dry needling;Energy conservation   PT Next Visit Plan Mobilizations of spine, STM to lumbar region, distraction   PT Home Exercise Plan see sheet   Consulted and Agree with Plan of Care Patient;Family member/caregiver   Family Member Consulted wife      Patient will benefit from skilled therapeutic intervention in order to improve the following deficits and impairments:  Abnormal gait, Decreased activity tolerance, Decreased balance, Decreased endurance, Decreased knowledge of use of DME, Decreased mobility, Decreased range of motion, Difficulty walking, Decreased strength, Hypomobility, Impaired flexibility, Impaired perceived functional ability, Increased muscle spasms, Postural dysfunction, Pain, Increased fascial restricitons  Visit Diagnosis: Chronic bilateral low back pain without sciatica  Decreased range of motion of lumbar spine  Other abnormalities of gait and mobility      G-Codes - 09-26-17 1556    Functional Assessment Tool Used (Outpatient Only) VAS, MMT, 5x STS, 10MWT, Slump test, SLR, ROM (gross), MODI, FABQ, clinical judgement   Functional Limitation Mobility: Walking and moving around   Mobility: Walking and Moving  Around Current Status [phone]) At least 20 percent but less than 40 percent impaired, limited or restricted   Mobility: Walking and Moving Around Goal Status [phone]) At least 1 percent but less than 20 percent impaired, limited or restricted       Problem List Patient Active Problem List   Diagnosis Date Noted  . Essential hypertension 09/09/2017  . Type 2 diabetes mellitus ([place]) 09/09/2017  . GERD (gastroesophageal reflux disease) 09/09/2017  . Testosterone deficiency 09/09/2017  . OSA (obstructive sleep apnea) 09/09/2017  . Chronic bilateral low back pain without sciatica 09/09/2017  Janna Arch, PT, DPT    Janna Arch 09-26-17, 3:58 PM  [place] MAIN [facility] SERVICES [address] [place], [place], [address] Phone: [phone]   Fax:  [phone]  Name: Adam Wise MRN: [id number] Date of Birth: [birth year]

## 2017-09-18 DIAGNOSIS — R0602 Shortness of breath: Secondary | ICD-10-CM | POA: Diagnosis not present

## 2017-09-21 ENCOUNTER — Other Ambulatory Visit: Payer: Self-pay | Admitting: Specialist

## 2017-09-21 ENCOUNTER — Ambulatory Visit: Payer: Medicare Other

## 2017-09-21 DIAGNOSIS — R2689 Other abnormalities of gait and mobility: Secondary | ICD-10-CM

## 2017-09-21 DIAGNOSIS — G8929 Other chronic pain: Secondary | ICD-10-CM | POA: Diagnosis not present

## 2017-09-21 DIAGNOSIS — M5386 Other specified dorsopathies, lumbar region: Secondary | ICD-10-CM | POA: Diagnosis not present

## 2017-09-21 DIAGNOSIS — R0602 Shortness of breath: Secondary | ICD-10-CM

## 2017-09-21 DIAGNOSIS — M545 Low back pain: Principal | ICD-10-CM

## 2017-09-21 NOTE — Therapy (Signed)
[place] MAIN [facility] SERVICES [address] [place], [place], [address] Phone: [phone]   Fax:  [phone]  Physical Therapy Treatment  Patient Details  Name: Adam Wise MRN: [id number] Date of Birth: April 10, 1951 Referring Provider: [name], NP  Encounter Date: 09/21/2017      PT End of Session - 09/21/17 1423    Visit Number 2   Number of Visits 16   Date for PT Re-Evaluation 11/11/17   Authorization - Visit Number 2   Authorization - Number of Visits 10   PT Start Time 6283   PT Stop Time 1430   PT Time Calculation (min) 45 min   Equipment Utilized During Treatment Gait belt   Activity Tolerance Patient tolerated treatment well;Patient limited by pain   Behavior During Therapy [facility] for tasks assessed/performed      Past Medical History:  Diagnosis Date  . Chickenpox   . Chronic knee pain   . Chronic low back pain   . Essential hypertension   . GERD (gastroesophageal reflux disease)   . OSA (obstructive sleep apnea)   . Testosterone deficiency   . Type 2 diabetes mellitus ([place])     Past Surgical History:  Procedure Laterality Date  . REPLACEMENT TOTAL KNEE BILATERAL  2015  . TONSILLECTOMY  1960    There were no vitals filed for this visit.               Subjective Assessment - 09/21/17 1348    Subjective Patient compliant with HEP, had a good weekend. HEP helped stiffness/pain   Patient is accompained by: Family member   Pertinent History Patient is a pleasant 66 year old male who presents to physical therapy for low back pain. Had PT 6 months ago for his back in [place]. Reports having sciatica two years ago in 2016, went away with acupuncture. Right IT band has been tight ever since 2016. Pain occurs across upper lumbar and will spread to between shoulder blades with occasional tingling in fingers when sleeping on back at night. Pt. Has a history of two knee replacements and was a Radio producer. He and his wife recently moved to [place] and a currently living with their daughter while searching for a home.    Limitations Sitting;Lifting;Standing;Walking;House hold activities   How long can you sit comfortably? getting up from sitting is bothersome   How long can you stand comfortably? half hour   How long can you walk comfortably? 15 minutes   Diagnostic tests X ray   Patient Stated Goals be able to pick things back up   Currently in Pain? Yes   Pain Score 3    Pain Location Back   Pain Orientation Lower;Mid   Pain Descriptors / Indicators Aching   Pain Type Chronic pain   Pain Onset More than a month ago   Pain Frequency Constant    Manual: Grade I-II mobilizations of thoracolumbar region, hypomobile and tender  STM to parapinals and musculature above iliac crests Prone hip flexor stretch PROM 60-90 second holds hamstring, popliteal angle, dorsiflexion overpressure, IT band, Ab, Ad,  Distraction with belt, SAD, lateral and inferior multiple angles 5x20 second holds   TherEx TrA activation with swiss ball 10x 5 second holds  TrA activation without swiss ball 10x 4 second holds  Pt. response to medical necessity: Patient will continue to benefit from skilled physical therapy to decrease pain and improve quality of life.  PT Education - 09/21/17 1423    Education provided Yes   Education Details abdominal activation    Person(s) Educated Patient   Methods Explanation;Demonstration   Comprehension Verbalized understanding;Returned demonstration          PT Short Term Goals - 09/16/17 1550      PT SHORT TERM GOAL #1   Title Patient will be independent in home exercise program to improve strength/mobility for better functional independence with ADLs.   Baseline HEP given   Time 2   Period Weeks   Status New   Target Date 09/30/17     PT SHORT TERM GOAL #2   Title Patient's average low back  pain will decrease to 3/10 to improve quality of life.   Baseline 10/3: average pain is 5/10   Time 2   Period Weeks   Status New   Target Date 09/30/17           PT Long Term Goals - 09/16/17 1551      PT LONG TERM GOAL #1   Title Patient will report a worst pain of 3/10 on VAS in lumbar region to improve tolerance with ADLs and reduced symptoms with activities   Baseline 10/3: 8/10   Time 8   Period Weeks   Status New   Target Date 11/11/17     PT LONG TERM GOAL #2   Title Patient will reduce modified Oswestry score to <20 as to demonstrate minimal disability with ADLs including improved sleeping tolerance, walking/sitting tolerance etc for better mobility with ADLs.    Baseline 10/3: 22%   Time 8   Period Weeks   Status New   Target Date 11/11/17     PT LONG TERM GOAL #3   Title Patient will decrease FABQ to 30/96 to demonstrate decreased disability and fear of movement fo rimproved quality of life.    Baseline 10/3: 42/96   Time 8   Period Weeks   Status New   Target Date 11/11/17     PT LONG TERM GOAL #4   Title Patient will be able to perform household work/ chores without increase in symptoms.   Baseline 10/3: pain with movement   Time 8   Period Weeks   Status New   Target Date 11/11/17               Plan - 09/21/17 1426    Clinical Impression Statement Patient educated on and performed abdominal activation interventions with and without swiss ball to educated patient on importance of core activation for back support. Thoracolumbar spine hypomobile and tender to mobilizations at this time. Improved with repetition. Patient will continue to benefit from skilled physical therapy to decrease pain and improve quality of life.    Rehab Potential Good   Clinical Impairments Affecting Rehab Potential (-) HTN, reflux, bilateral TKA, diabetes, history of chronic back pain,recent move (+) good family support and understanding of medical field   PT Frequency 2x  / week   PT Duration 8 weeks   PT Treatment/Interventions ADLs/Self Care Home Management;Aquatic Therapy;Cryotherapy;Electrical Stimulation;Iontophoresis 4mg /ml Dexamethasone;Moist Heat;Traction;Ultrasound;DME Instruction;Balance training;Therapeutic exercise;Therapeutic activities;Functional mobility training;Stair training;Gait training;Neuromuscular re-education;Patient/family education;Manual techniques;Passive range of motion;Dry needling;Energy conservation   PT Next Visit Plan Mobilizations of spine, STM to lumbar region, distraction   PT Home Exercise Plan see sheet   Consulted and Agree with Plan of Care Patient;Family member/caregiver   Family Member Consulted wife      Patient will benefit from skilled therapeutic intervention in  order to improve the following deficits and impairments:  Abnormal gait, Decreased activity tolerance, Decreased balance, Decreased endurance, Decreased knowledge of use of DME, Decreased mobility, Decreased range of motion, Difficulty walking, Decreased strength, Hypomobility, Impaired flexibility, Impaired perceived functional ability, Increased muscle spasms, Postural dysfunction, Pain, Increased fascial restricitons  Visit Diagnosis: Chronic bilateral low back pain without sciatica  Decreased range of motion of lumbar spine  Other abnormalities of gait and mobility     Problem List Patient Active Problem List   Diagnosis Date Noted  . Essential hypertension 09/09/2017  . Type 2 diabetes mellitus ([place]) 09/09/2017  . GERD (gastroesophageal reflux disease) 09/09/2017  . Testosterone deficiency 09/09/2017  . OSA (obstructive sleep apnea) 09/09/2017  . Chronic bilateral low back pain without sciatica 09/09/2017   Janna Arch, PT, DPT   Janna Arch 09/21/2017, 2:31 PM  [place] MAIN [facility] SERVICES [address] [place], [place], [address] Phone: [phone]   Fax:  [phone]  Name: Adam Wise MRN: [id number] Date of Birth: [birth year]

## 2017-09-23 ENCOUNTER — Ambulatory Visit: Payer: Medicare Other

## 2017-09-23 DIAGNOSIS — G8929 Other chronic pain: Secondary | ICD-10-CM | POA: Diagnosis not present

## 2017-09-23 DIAGNOSIS — M5386 Other specified dorsopathies, lumbar region: Secondary | ICD-10-CM

## 2017-09-23 DIAGNOSIS — M545 Low back pain, unspecified: Secondary | ICD-10-CM

## 2017-09-23 DIAGNOSIS — R2689 Other abnormalities of gait and mobility: Secondary | ICD-10-CM | POA: Diagnosis not present

## 2017-09-23 NOTE — Therapy (Signed)
[place] MAIN [facility] SERVICES [address] [place], [place], [address] Phone: [phone]   Fax:  [phone]  Physical Therapy Treatment  Patient Details  Name: Adam Wise MRN: [id number] Date of Birth: [birth year] Referring Provider: [name], NP  Encounter Date: 09/23/2017      PT End of Session - 09/23/17 1501    Visit Number 3   Number of Visits 16   Date for PT Re-Evaluation 11/11/17   Authorization - Visit Number 3   Authorization - Number of Visits 10   PT Start Time 1430   PT Stop Time 1515   PT Time Calculation (min) 45 min   Equipment Utilized During Treatment Gait belt   Activity Tolerance Patient tolerated treatment well;Patient limited by pain   Behavior During Therapy [facility] for tasks assessed/performed      Past Medical History:  Diagnosis Date  . Chickenpox   . Chronic knee pain   . Chronic low back pain   . Essential hypertension   . GERD (gastroesophageal reflux disease)   . OSA (obstructive sleep apnea)   . Testosterone deficiency   . Type 2 diabetes mellitus ([place])     Past Surgical History:  Procedure Laterality Date  . REPLACEMENT TOTAL KNEE BILATERAL  2015  . TONSILLECTOMY  1960    There were no vitals filed for this visit.      Subjective Assessment - 09/23/17 1433    Subjective patient having tingling in 2nd and 3rd toes of L foot, feels like being jabbed from end, doesn't happen all the time, started last night.    Patient is accompained by: Family member   Pertinent History Patient is a pleasant 66 year old male who presents to physical therapy for low back pain. Had PT 6 months ago for his back in [place]. Reports having sciatica two years ago in 2016, went away with acupuncture. Right IT band has been tight ever since 2016. Pain occurs across upper lumbar and will spread to between shoulder blades with occasional tingling in fingers when sleeping on back at night. Pt. 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    Limitations Sitting;Lifting;Standing;Walking;House hold activities   How long can you sit comfortably? getting up from sitting is bothersome   How long can you stand comfortably? half hour   How long can you walk comfortably? 15 minutes   Diagnostic tests X ray   Patient Stated Goals be able to pick things back up   Currently in Pain? Yes   Pain Score 1    Pain Location Back   Pain Orientation Lower;Mid   Pain Descriptors / Indicators Aching   Pain Type Chronic pain   Pain Onset More than a month ago   Pain Frequency Constant      Manual PROM 60-90 second holds hamstring, popliteal angle, dorsiflexion overpressure, IT band, Ab, Ad,  Distraction with belt, SAD, lateral and inferior multiple angles 5x20 second holds    TherEx 90 90 Neural glides 5x5 SLR to determine neural involvement (-) TrA activation with swiss ball 10x 5 second holds  TrA activation with heel slides 10x each leg TrA activation  With swiss ball with UE raises 10x each arm TrA activation without swiss ball 10x 4 second holds LE rotations for back relief  Pt. response to  medical necessity: Patient will continue to benefit from skilled physical therapy to decrease pain and improve quality of life.                           PT Education - 09/23/17 1705    Education provided Yes   Education Details functional movement   Person(s) Educated Patient   Methods Explanation;Demonstration;Verbal cues   Comprehension Verbalized understanding;Returned demonstration          PT Short Term Goals - 09/16/17 1550      PT SHORT TERM GOAL #1   Title Patient will be independent in home exercise program to improve strength/mobility for better functional independence with ADLs.   Baseline HEP given   Time 2    Period Weeks   Status New   Target Date 09/30/17     PT SHORT TERM GOAL #2   Title Patient's average low back pain will decrease to 3/10 to improve quality of life.   Baseline 10/3: average pain is 5/10   Time 2   Period Weeks   Status New   Target Date 09/30/17           PT Long Term Goals - 09/16/17 1551      PT LONG TERM GOAL #1   Title Patient will report a worst pain of 3/10 on VAS in lumbar region to improve tolerance with ADLs and reduced symptoms with activities   Baseline 10/3: 8/10   Time 8   Period Weeks   Status New   Target Date 11/11/17     PT LONG TERM GOAL #2   Title Patient will reduce modified Oswestry score to <20 as to demonstrate minimal disability with ADLs including improved sleeping tolerance, walking/sitting tolerance etc for better mobility with ADLs.    Baseline 10/3: 22%   Time 8   Period Weeks   Status New   Target Date 11/11/17     PT LONG TERM GOAL #3   Title Patient will decrease FABQ to 30/96 to demonstrate decreased disability and fear of movement fo rimproved quality of life.    Baseline 10/3: 42/96   Time 8   Period Weeks   Status New   Target Date 11/11/17     PT LONG TERM GOAL #4   Title Patient will be able to perform household work/ chores without increase in symptoms.   Baseline 10/3: pain with movement   Time 8   Period Weeks   Status New   Target Date 11/11/17               Plan - 09/23/17 1508    Clinical Impression Statement Patient demonstrated improved activation of TrA with swiss ball. Neurological glides were educated to patient with patient having relief of neurological symptoms of L toes. Patient will continue to benefit from skilled physical therapy to decrease pain and improve quality of life.   Rehab Potential Good   Clinical Impairments Affecting Rehab Potential (-) HTN, reflux, bilateral TKA, diabetes, history of chronic back pain,recent move (+) good family support and understanding of medical  field   PT Frequency 2x / week   PT Duration 8 weeks   PT Treatment/Interventions ADLs/Self Care Home Management;Aquatic Therapy;Cryotherapy;Electrical Stimulation;Iontophoresis 4mg /ml Dexamethasone;Moist Heat;Traction;Ultrasound;DME Instruction;Balance training;Therapeutic exercise;Therapeutic activities;Functional mobility training;Stair training;Gait training;Neuromuscular re-education;Patient/family education;Manual techniques;Passive range of motion;Dry needling;Energy conservation   PT Next Visit Plan Mobilizations of spine, STM to lumbar region, distraction   PT Home Exercise Plan  see sheet   Consulted and Agree with Plan of Care Patient;Family member/caregiver   Family Member Consulted wife      Patient will benefit from skilled therapeutic intervention in order to improve the following deficits and impairments:  Abnormal gait, Decreased activity tolerance, Decreased balance, Decreased endurance, Decreased knowledge of use of DME, Decreased mobility, Decreased range of motion, Difficulty walking, Decreased strength, Hypomobility, Impaired flexibility, Impaired perceived functional ability, Increased muscle spasms, Postural dysfunction, Pain, Increased fascial restricitons  Visit Diagnosis: Chronic bilateral low back pain without sciatica  Decreased range of motion of lumbar spine  Other abnormalities of gait and mobility     Problem List Patient Active Problem List   Diagnosis Date Noted  . Essential hypertension 09/09/2017  . Type 2 diabetes mellitus ([place]) 09/09/2017  . GERD (gastroesophageal reflux disease) 09/09/2017  . Testosterone deficiency 09/09/2017  . OSA (obstructive sleep apnea) 09/09/2017  . Chronic bilateral low back pain without sciatica 09/09/2017   Janna Arch, PT, DPT   Janna Arch 09/23/2017, 5:06 PM  [place] MAIN [facility] SERVICES [address] [place], [place], [address] Phone: [phone]   Fax:   [phone]  Name: Adam Wise MRN: [id number] Date of Birth: February 01, 1951

## 2017-09-25 ENCOUNTER — Ambulatory Visit
Admission: RE | Admit: 2017-09-25 | Discharge: 2017-09-25 | Disposition: A | Discharge status: Home / Self Care | Payer: Medicare Other | Source: Ambulatory Visit | Attending: Specialist | Admitting: Specialist

## 2017-09-25 DIAGNOSIS — R0602 Shortness of breath: Secondary | ICD-10-CM

## 2017-09-25 DIAGNOSIS — I2510 Atherosclerotic heart disease of native coronary artery without angina pectoris: Secondary | ICD-10-CM | POA: Insufficient documentation

## 2017-09-25 DIAGNOSIS — J439 Emphysema, unspecified: Secondary | ICD-10-CM | POA: Insufficient documentation

## 2017-09-25 DIAGNOSIS — I700 Atherosclerosis of aorta: Secondary | ICD-10-CM | POA: Diagnosis not present

## 2017-09-25 DIAGNOSIS — I318 Other specified diseases of pericardium: Secondary | ICD-10-CM | POA: Diagnosis not present

## 2017-09-29 ENCOUNTER — Ambulatory Visit: Payer: Medicare Other

## 2017-09-29 DIAGNOSIS — M5386 Other specified dorsopathies, lumbar region: Secondary | ICD-10-CM

## 2017-09-29 DIAGNOSIS — G8929 Other chronic pain: Secondary | ICD-10-CM

## 2017-09-29 DIAGNOSIS — R2689 Other abnormalities of gait and mobility: Secondary | ICD-10-CM

## 2017-09-29 DIAGNOSIS — M545 Low back pain: Secondary | ICD-10-CM | POA: Diagnosis not present

## 2017-09-29 NOTE — Therapy (Signed)
[place] MAIN [facility] SERVICES [address] [place], [place], [address] Phone: [phone]   Fax:  [phone]  Physical Therapy Treatment  Patient Details  Name: Adam Wise MRN: [id number] Date of Birth: May 25, 1951 Referring Provider: [name], NP  Encounter Date: 09/29/2017      PT End of Session - 09/29/17 1609    Visit Number 4   Number of Visits 16   Date for PT Re-Evaluation 11/11/17   Authorization - Visit Number 4   Authorization - Number of Visits 10   PT Start Time 6468   PT Stop Time 1600   PT Time Calculation (min) 45 min   Equipment Utilized During Treatment Gait belt   Activity Tolerance Patient tolerated treatment well;Patient limited by pain   Behavior During Therapy [facility] for tasks assessed/performed      Past Medical History:  Diagnosis Date  . Chickenpox   . Chronic knee pain   . Chronic low back pain   . Essential hypertension   . GERD (gastroesophageal reflux disease)   . OSA (obstructive sleep apnea)   . Testosterone deficiency   . Type 2 diabetes mellitus ([place])     Past Surgical History:  Procedure Laterality Date  . REPLACEMENT TOTAL KNEE BILATERAL  2015  . TONSILLECTOMY  1960    There were no vitals filed for this visit.      Subjective Assessment - 09/29/17 1517    Subjective Patient reports no more toe tingling, stopped day after last session. Lost power and was not able to do HEP as frequent.    Patient is accompained by: Family member   Pertinent History Patient is a pleasant 66 year old male who presents to physical therapy for low back pain. Had PT 6 months ago for his back in [place]. Reports having sciatica two years ago in 2016, went away with acupuncture. Right IT band has been tight ever since 2016. Pain occurs across upper lumbar and will spread to between shoulder blades with occasional tingling in fingers when sleeping on back at night. Pt. 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    Limitations Sitting;Lifting;Standing;Walking;House hold activities   How long can you sit comfortably? getting up from sitting is bothersome   How long can you stand comfortably? half hour   How long can you walk comfortably? 15 minutes   Diagnostic tests X ray   Patient Stated Goals be able to pick things back up   Currently in Pain? Yes   Pain Score 1    Pain Location Back   Pain Orientation Lower   Pain Descriptors / Indicators Aching   Pain Type Chronic pain   Pain Onset More than a month ago   Pain Frequency Constant         Manual PROM 60-90 second holds hamstring, popliteal angle, dorsiflexion overpressure, IT band, Ab, Ad,  Distraction with belt, SAD, lateral and inferior multiple angles 5x20 second holds    TherEx 90 90 Neural glides 5x5 SLR to determine neural involvement (-) TrA activation with swiss ball 10x 5 second holds  TrA activation with heel slides 10x each leg TrA activation  With swiss ball with UE raises 10x each arm TrA activation without swiss ball 10x 4 second holds Pelvic posterior tilts 10x 5 second holds  Pt. response to medical necessity: Patient will continue to benefit from skilled physical therapy to decrease pain and improve quality of life                           PT Education - 09/29/17 1607    Education provided Yes   Education Details abdominal activation   Person(s) Educated Patient   Methods Explanation;Demonstration   Comprehension Verbalized understanding;Returned demonstration          PT Short Term Goals - 09/16/17 1550      PT SHORT TERM GOAL #1   Title Patient will be independent in home exercise program to improve strength/mobility for better functional independence with ADLs.   Baseline HEP given   Time 2   Period Weeks    Status New   Target Date 09/30/17     PT SHORT TERM GOAL #2   Title Patient's average low back pain will decrease to 3/10 to improve quality of life.   Baseline 10/3: average pain is 5/10   Time 2   Period Weeks   Status New   Target Date 09/30/17           PT Long Term Goals - 09/16/17 1551      PT LONG TERM GOAL #1   Title Patient will report a worst pain of 3/10 on VAS in lumbar region to improve tolerance with ADLs and reduced symptoms with activities   Baseline 10/3: 8/10   Time 8   Period Weeks   Status New   Target Date 11/11/17     PT LONG TERM GOAL #2   Title Patient will reduce modified Oswestry score to <20 as to demonstrate minimal disability with ADLs including improved sleeping tolerance, walking/sitting tolerance etc for better mobility with ADLs.    Baseline 10/3: 22%   Time 8   Period Weeks   Status New   Target Date 11/11/17     PT LONG TERM GOAL #3   Title Patient will decrease FABQ to 30/96 to demonstrate decreased disability and fear of movement fo rimproved quality of life.    Baseline 10/3: 42/96   Time 8   Period Weeks   Status New   Target Date 11/11/17     PT LONG TERM GOAL #4   Title Patient will be able to perform household work/ chores without increase in symptoms.   Baseline 10/3: pain with movement   Time 8   Period Weeks   Status New   Target Date 11/11/17               Plan - 09/29/17 1611    Clinical Impression Statement Patient progressing with therex, no longer having neurological symptoms radiating to toes.  Patient require occasional verbal cueing for TrA activation. Patient will continue to benefit from skilled physical therapy to decrease pain and improve quality of life   Rehab Potential Good   Clinical Impairments Affecting Rehab Potential (-) HTN, reflux, bilateral TKA, diabetes, history of chronic back pain,recent move (+) good family support and understanding of medical field   PT Frequency 2x / week   PT  Duration 8 weeks   PT Treatment/Interventions ADLs/Self Care Home Management;Aquatic Therapy;Cryotherapy;Electrical Stimulation;Iontophoresis 4mg /ml Dexamethasone;Moist Heat;Traction;Ultrasound;DME Instruction;Balance training;Therapeutic exercise;Therapeutic activities;Functional mobility training;Stair training;Gait training;Neuromuscular re-education;Patient/family education;Manual techniques;Passive range of motion;Dry needling;Energy conservation   PT Next Visit Plan Mobilizations of spine, STM to lumbar region, distraction   PT Home Exercise Plan see sheet  Consulted and Agree with Plan of Care Patient;Family member/caregiver   Family Member Consulted wife      Patient will benefit from skilled therapeutic intervention in order to improve the following deficits and impairments:  Abnormal gait, Decreased activity tolerance, Decreased balance, Decreased endurance, Decreased knowledge of use of DME, Decreased mobility, Decreased range of motion, Difficulty walking, Decreased strength, Hypomobility, Impaired flexibility, Impaired perceived functional ability, Increased muscle spasms, Postural dysfunction, Pain, Increased fascial restricitons  Visit Diagnosis: Chronic bilateral low back pain without sciatica  Decreased range of motion of lumbar spine  Other abnormalities of gait and mobility     Problem List Patient Active Problem List   Diagnosis Date Noted  . Essential hypertension 09/09/2017  . Type 2 diabetes mellitus ([place]) 09/09/2017  . GERD (gastroesophageal reflux disease) 09/09/2017  . Testosterone deficiency 09/09/2017  . OSA (obstructive sleep apnea) 09/09/2017  . Chronic bilateral low back pain without sciatica 09/09/2017   Janna Arch, PT, DPT   Janna Arch 09/29/2017, 4:12 PM  [place] MAIN [facility] SERVICES [address] [place], [place], [address] Phone: [phone]   Fax:  [phone]  Name: Nicoli Nardozzi MRN:  [id number] Date of Birth: 1951-08-15

## 2017-10-01 ENCOUNTER — Ambulatory Visit: Payer: Medicare Other

## 2017-10-01 DIAGNOSIS — R2689 Other abnormalities of gait and mobility: Secondary | ICD-10-CM | POA: Diagnosis not present

## 2017-10-01 DIAGNOSIS — M545 Low back pain: Secondary | ICD-10-CM | POA: Diagnosis not present

## 2017-10-01 DIAGNOSIS — M5386 Other specified dorsopathies, lumbar region: Secondary | ICD-10-CM

## 2017-10-01 DIAGNOSIS — G8929 Other chronic pain: Secondary | ICD-10-CM

## 2017-10-01 NOTE — Therapy (Signed)
[place] MAIN [facility] SERVICES [address] [place], [place], [address] Phone: [phone]   Fax:  [phone]  Physical Therapy Treatment  Patient Details  Name: Adam Wise MRN: [id number] Date of Birth: Mar 15, 1951 Referring Provider: [name], NP  Encounter Date: 10/01/2017      PT End of Session - 10/01/17 1637    Visit Number 5   Number of Visits 16   Date for PT Re-Evaluation 11/11/17   Authorization - Visit Number 5   Authorization - Number of Visits 10   PT Start Time 1600   PT Stop Time 4401   PT Time Calculation (min) 45 min   Equipment Utilized During Treatment Gait belt   Activity Tolerance Patient tolerated treatment well;Patient limited by pain   Behavior During Therapy [facility] for tasks assessed/performed      Past Medical History:  Diagnosis Date  . Chickenpox   . Chronic knee pain   . Chronic low back pain   . Essential hypertension   . GERD (gastroesophageal reflux disease)   . OSA (obstructive sleep apnea)   . Testosterone deficiency   . Type 2 diabetes mellitus ([place])     Past Surgical History:  Procedure Laterality Date  . REPLACEMENT TOTAL KNEE BILATERAL  2015  . TONSILLECTOMY  1960    There were no vitals filed for this visit.      Subjective Assessment - 10/01/17 1603    Subjective Patient sore after last session. No pain today, did HEP this morning. Worst pain 2/10 since last session.    Patient is accompained by: Family member   Pertinent History Patient is a pleasant 66 year old male who presents to physical therapy for low back pain. Had PT 6 months ago for his back in [place]. Reports having sciatica two years ago in 2016, went away with acupuncture. Right IT band has been tight ever since 2016. Pain occurs across upper lumbar and will spread to between shoulder blades with occasional tingling in fingers when sleeping on back at night. Pt. Has a history of two knee replacements and  was a Public librarian. He and his wife recently moved to [place] and a currently living with their daughter while searching for a home.    Limitations Sitting;Lifting;Standing;Walking;House hold activities   How long can you sit comfortably? getting up from sitting is bothersome   How long can you stand comfortably? half hour   How long can you walk comfortably? 15 minutes   Diagnostic tests X ray   Patient Stated Goals be able to pick things back up   Currently in Pain? No/denies         Manual PROM 60-90 second holds hamstring, popliteal angle, dorsiflexion overpressure, IT band, Ab, Ad,  Distraction with belt, SAD, lateral and inferior multiple angles 5x20 second holds  Grade I-III mobilizations CPAs and UPAs thoracolumbar, improved mobility of lumbar spine  Prone hamstring stretch 60 seconds   TherEx  TrA activation with swiss ball 10x 5 second holds  TrA activation with heel slides 10x each leg TrA activation  With swiss ball with UE raises 10x each arm  Pelvic posterior tilts 10x 5 second holds     Pt. response to medical necessity: Patient will continue to benefit from skilled physical therapy to decrease pain and improve quality of life  PT Education - 10/01/17 1636    Education provided Yes   Education Details LE stretching for loosened lumbar   Person(s) Educated Patient   Methods Explanation;Demonstration;Verbal cues   Comprehension Verbalized understanding;Returned demonstration          PT Short Term Goals - 09/16/17 1550      PT SHORT TERM GOAL #1   Title Patient will be independent in home exercise program to improve strength/mobility for better functional independence with ADLs.   Baseline HEP given   Time 2   Period Weeks   Status New   Target Date 09/30/17     PT SHORT TERM GOAL #2   Title Patient's average low back pain will decrease to 3/10 to improve quality of life.   Baseline 10/3:  average pain is 5/10   Time 2   Period Weeks   Status New   Target Date 09/30/17           PT Long Term Goals - 09/16/17 1551      PT LONG TERM GOAL #1   Title Patient will report a worst pain of 3/10 on VAS in lumbar region to improve tolerance with ADLs and reduced symptoms with activities   Baseline 10/3: 8/10   Time 8   Period Weeks   Status New   Target Date 11/11/17     PT LONG TERM GOAL #2   Title Patient will reduce modified Oswestry score to <20 as to demonstrate minimal disability with ADLs including improved sleeping tolerance, walking/sitting tolerance etc for better mobility with ADLs.    Baseline 10/3: 22%   Time 8   Period Weeks   Status New   Target Date 11/11/17     PT LONG TERM GOAL #3   Title Patient will decrease FABQ to 30/96 to demonstrate decreased disability and fear of movement fo rimproved quality of life.    Baseline 10/3: 42/96   Time 8   Period Weeks   Status New   Target Date 11/11/17     PT LONG TERM GOAL #4   Title Patient will be able to perform household work/ chores without increase in symptoms.   Baseline 10/3: pain with movement   Time 8   Period Weeks   Status New   Target Date 11/11/17               Plan - 10/01/17 1638    Clinical Impression Statement Patient demonstrating improved mobility of lumbar spine upon manual tx. Decreased pain noted in past few days after session and translating multiple days after session. TA contractions continues to challenge patient. Patient will continue to benefit from skilled physical therapy to decrease pain and improve quality of life   Rehab Potential Good   Clinical Impairments Affecting Rehab Potential (-) HTN, reflux, bilateral TKA, diabetes, history of chronic back pain,recent move (+) good family support and understanding of medical field   PT Frequency 2x / week   PT Duration 8 weeks   PT Treatment/Interventions ADLs/Self Care Home Management;Aquatic  Therapy;Cryotherapy;Electrical Stimulation;Iontophoresis 4mg /ml Dexamethasone;Moist Heat;Traction;Ultrasound;DME Instruction;Balance training;Therapeutic exercise;Therapeutic activities;Functional mobility training;Stair training;Gait training;Neuromuscular re-education;Patient/family education;Manual techniques;Passive range of motion;Dry needling;Energy conservation   PT Next Visit Plan Mobilizations of spine, STM to lumbar region, distraction   PT Home Exercise Plan see sheet   Consulted and Agree with Plan of Care Patient;Family member/caregiver   Family Member Consulted wife      Patient will benefit from skilled therapeutic intervention in order to improve the  following deficits and impairments:  Abnormal gait, Decreased activity tolerance, Decreased balance, Decreased endurance, Decreased knowledge of use of DME, Decreased mobility, Decreased range of motion, Difficulty walking, Decreased strength, Hypomobility, Impaired flexibility, Impaired perceived functional ability, Increased muscle spasms, Postural dysfunction, Pain, Increased fascial restricitons  Visit Diagnosis: Chronic bilateral low back pain without sciatica  Decreased range of motion of lumbar spine  Other abnormalities of gait and mobility     Problem List Patient Active Problem List   Diagnosis Date Noted  . Essential hypertension 09/09/2017  . Type 2 diabetes mellitus ([place]) 09/09/2017  . GERD (gastroesophageal reflux disease) 09/09/2017  . Testosterone deficiency 09/09/2017  . OSA (obstructive sleep apnea) 09/09/2017  . Chronic bilateral low back pain without sciatica 09/09/2017   Janna Arch, PT, DPT   Janna Arch 10/01/2017, 4:46 PM  [place] MAIN [facility] SERVICES [address] [place], [place], [address] Phone: [phone]   Fax:  [phone]  Name: Adam Wise MRN: [id number] Date of Birth: 04-05-1951

## 2017-10-05 ENCOUNTER — Ambulatory Visit: Payer: Medicare Other

## 2017-10-05 DIAGNOSIS — R2689 Other abnormalities of gait and mobility: Secondary | ICD-10-CM

## 2017-10-05 DIAGNOSIS — G8929 Other chronic pain: Secondary | ICD-10-CM | POA: Diagnosis not present

## 2017-10-05 DIAGNOSIS — M5386 Other specified dorsopathies, lumbar region: Secondary | ICD-10-CM

## 2017-10-05 DIAGNOSIS — M545 Low back pain: Secondary | ICD-10-CM | POA: Diagnosis not present

## 2017-10-05 NOTE — Therapy (Signed)
[place] MAIN [facility] SERVICES [address] [place], [place], [address] Phone: [phone]   Fax:  [phone]  Physical Therapy Treatment  Patient Details  Name: Adam Wise MRN: [id number] Date of Birth: [birth year] Referring Provider: [name], NP  Encounter Date: 10/05/2017      PT End of Session - 10/05/17 1357    Visit Number 6   Number of Visits 16   Date for PT Re-Evaluation 11/11/17   Authorization - Visit Number 6   Authorization - Number of Visits 10   PT Start Time 0277   PT Stop Time 1430   PT Time Calculation (min) 45 min   Equipment Utilized During Treatment Gait belt   Activity Tolerance Patient tolerated treatment well;Patient limited by pain   Behavior During Therapy [facility] for tasks assessed/performed      Past Medical History:  Diagnosis Date  . Chickenpox   . Chronic knee pain   . Chronic low back pain   . Essential hypertension   . GERD (gastroesophageal reflux disease)   . OSA (obstructive sleep apnea)   . Testosterone deficiency   . Type 2 diabetes mellitus ([place])     Past Surgical History:  Procedure Laterality Date  . REPLACEMENT TOTAL KNEE BILATERAL  2015  . TONSILLECTOMY  1960    There were no vitals filed for this visit.      Subjective Assessment - 10/05/17 1349    Subjective Patient reports worst pain 1/10 since last session. No pain now. Compliant with HEP.    Patient is accompained by: Family member   Pertinent History Patient is a pleasant 66 year old male who presents to physical therapy for low back pain. Had PT 6 months ago for his back in [place]. Reports having sciatica two years ago in 2016, went away with acupuncture. Right IT band has been tight ever since 2016. Pain occurs across upper lumbar and will spread to between shoulder blades with occasional tingling in fingers when sleeping on back at night. Pt. Has a history of two knee replacements and was a Air cabin crew. He and his wife recently moved to [place] and a currently living with their daughter while searching for a home.    Limitations Sitting;Lifting;Standing;Walking;House hold activities   How long can you sit comfortably? getting up from sitting is bothersome   How long can you stand comfortably? half hour   How long can you walk comfortably? 15 minutes   Diagnostic tests X ray   Patient Stated Goals be able to pick things back up   Currently in Pain? No/denies       Manual PROM 60-90 second holds hamstring, popliteal angle, dorsiflexion overpressure, IT band, Ab, Ad,  Distraction with belt, SAD, lateral and inferior multiple angles 5x20 second holds    IT band roller    TherEx  90 90 neural glides 5x5.  TrA activation with swiss ball 10x 5 second holds  TrA activation modified dead bug 10x 5 second holds  Standing IT band stretch in // bars 6" step 2x30 seconds  Standing hamstring stretch 6" step  Standing hip flexor stretch 6" step 2x60 seconds  Stair stretch calf stretch 2x 60 seconds       Pt. response to medical necessity: Patient will continue to benefit from skilled physical therapy to decrease pain and improve quality of life  PT Education - 10/05/17 1350    Education provided Yes   Education Details LE stretching for loosened lumbar    Person(s) Educated Patient   Methods Explanation;Demonstration   Comprehension Verbalized understanding;Returned demonstration          PT Short Term Goals - 09/16/17 1550      PT SHORT TERM GOAL #1   Title Patient will be independent in home exercise program to improve strength/mobility for better functional independence with ADLs.   Baseline HEP given   Time 2   Period Weeks   Status New   Target Date 09/30/17     PT SHORT TERM GOAL #2   Title Patient's average low back pain will decrease to 3/10 to improve quality of life.   Baseline 10/3: average pain is 5/10    Time 2   Period Weeks   Status New   Target Date 09/30/17           PT Long Term Goals - 09/16/17 1551      PT LONG TERM GOAL #1   Title Patient will report a worst pain of 3/10 on VAS in lumbar region to improve tolerance with ADLs and reduced symptoms with activities   Baseline 10/3: 8/10   Time 8   Period Weeks   Status New   Target Date 11/11/17     PT LONG TERM GOAL #2   Title Patient will reduce modified Oswestry score to <20 as to demonstrate minimal disability with ADLs including improved sleeping tolerance, walking/sitting tolerance etc for better mobility with ADLs.    Baseline 10/3: 22%   Time 8   Period Weeks   Status New   Target Date 11/11/17     PT LONG TERM GOAL #3   Title Patient will decrease FABQ to 30/96 to demonstrate decreased disability and fear of movement fo rimproved quality of life.    Baseline 10/3: 42/96   Time 8   Period Weeks   Status New   Target Date 11/11/17     PT LONG TERM GOAL #4   Title Patient will be able to perform household work/ chores without increase in symptoms.   Baseline 10/3: pain with movement   Time 8   Period Weeks   Status New   Target Date 11/11/17               Plan - 10/05/17 1415    Clinical Impression Statement Patient progressing with LE mobility and length in supine stretching position. Decreased pain noted between sessions demonstrating progressing. Standing IT band stretch performed and patient demonstrated understanding. Patient will continue to benefit from skilled physical therapy to decrease pain and improve quality of life.   Rehab Potential Good   Clinical Impairments Affecting Rehab Potential (-) HTN, reflux, bilateral TKA, diabetes, history of chronic back pain,recent move (+) good family support and understanding of medical field   PT Frequency 2x / week   PT Duration 8 weeks   PT Treatment/Interventions ADLs/Self Care Home Management;Aquatic Therapy;Cryotherapy;Electrical  Stimulation;Iontophoresis 4mg /ml Dexamethasone;Moist Heat;Traction;Ultrasound;DME Instruction;Balance training;Therapeutic exercise;Therapeutic activities;Functional mobility training;Stair training;Gait training;Neuromuscular re-education;Patient/family education;Manual techniques;Passive range of motion;Dry needling;Energy conservation   PT Next Visit Plan Mobilizations of spine, STM to lumbar region, distraction   PT Home Exercise Plan see sheet   Consulted and Agree with Plan of Care Patient;Family member/caregiver   Family Member Consulted wife      Patient will benefit from skilled therapeutic intervention in order to improve the following deficits and impairments:  Abnormal gait, Decreased activity tolerance, Decreased balance, Decreased endurance, Decreased knowledge of use of DME, Decreased mobility, Decreased range of motion, Difficulty walking, Decreased strength, Hypomobility, Impaired flexibility, Impaired perceived functional ability, Increased muscle spasms, Postural dysfunction, Pain, Increased fascial restricitons  Visit Diagnosis: Chronic bilateral low back pain without sciatica  Decreased range of motion of lumbar spine  Other abnormalities of gait and mobility     Problem List Patient Active Problem List   Diagnosis Date Noted  . Essential hypertension 09/09/2017  . Type 2 diabetes mellitus ([place]) 09/09/2017  . GERD (gastroesophageal reflux disease) 09/09/2017  . Testosterone deficiency 09/09/2017  . OSA (obstructive sleep apnea) 09/09/2017  . Chronic bilateral low back pain without sciatica 09/09/2017   Janna Arch, PT, DPT   Janna Arch 10/05/2017, 2:30 PM  [place] MAIN [facility] SERVICES [address] [place], [place], [address] Phone: [phone]   Fax:  [phone]  Name: Adam Wise MRN: [id number] Date of Birth: 02/25/1951

## 2017-10-06 ENCOUNTER — Ambulatory Visit (INDEPENDENT_AMBULATORY_CARE_PROVIDER_SITE_OTHER): Payer: Medicare Other | Admitting: Urology

## 2017-10-06 ENCOUNTER — Encounter: Payer: Self-pay | Admitting: Urology

## 2017-10-06 VITALS — BP 162/85 | HR 87 | Ht 69.0 in | Wt 280.0 lb

## 2017-10-06 DIAGNOSIS — N400 Enlarged prostate without lower urinary tract symptoms: Secondary | ICD-10-CM | POA: Diagnosis not present

## 2017-10-06 DIAGNOSIS — E291 Testicular hypofunction: Secondary | ICD-10-CM | POA: Diagnosis not present

## 2017-10-06 MED ORDER — TESTOSTERONE CYPIONATE 200 MG/ML IM SOLN: 200.0000 mg | INTRAMUSCULAR | 0 refills | Status: DC

## 2017-10-06 NOTE — Progress Notes (Signed)
10/06/2017 4:46 PM   Adam Wise December 02, 1951 [id number]  Referring provider: [name], NP [place], [place] [address]  Chief Complaint  Patient presents with  . Hypogonadism    New Patient    HPI: Adam Wise is a 66 y.o. male who presents today to establish care for hypogonadism.  He recently moved to the area from [place] and has been on testosterone replacement therapy for approximately 5 years.  He was followed by his primary care physician and was injecting 200 mg every 2 weeks.  Clinical symptoms including tiredness, fatigue, moodiness and erectile dysfunction.  He takes sildenafil as needed.  He has had no side effects with testosterone replacement therapy.  He states labs were checked periodically.  He presently has no bothersome lower urinary tract symptoms.  Denies dysuria or gross hematuria.  Denies flank, abdominal, pelvic or scrotal pain.  Denies breast enlargement/tenderness or lower extremity edema.  A PSA in April 2017 was 0.9.   PMH: Past Medical History:  Diagnosis Date  . Chickenpox   . Chronic knee pain   . Chronic low back pain   . Essential hypertension   . GERD (gastroesophageal reflux disease)   . OSA (obstructive sleep apnea)   . Testosterone deficiency   . Type 2 diabetes mellitus [facility])     Surgical History: Past Surgical History:  Procedure Laterality Date  . REPLACEMENT TOTAL KNEE BILATERAL  2015  . TONSILLECTOMY  1960    Home Medications:  Allergies as of 10/06/2017      Reactions   Bupropion    Racing heart      Medication List       Accurate as of 10/06/17  4:46 PM. Always use your most recent med list.          amLODipine 10 MG tablet Commonly known as:  NORVASC Take 10 mg by mouth daily.   aspirin EC 81 MG tablet Take by mouth.   COQ10 PO Take 300 mg by mouth daily.   esomeprazole 20 MG capsule Commonly known as:  NEXIUM Take 20 mg by mouth daily at 12 noon.   glipiZIDE 10 MG  tablet Commonly known as:  GLUCOTROL Take 10 mg by mouth 2 (two) times daily before a meal.   KRILL OIL PO Take 350 mg by mouth daily.   lisinopril 5 MG tablet Commonly known as:  PRINIVIL,ZESTRIL Take 5 mg by mouth daily.   Magnesium 500 MG Tabs Take 500 mg by mouth daily.   metFORMIN 1000 MG tablet Commonly known as:  GLUCOPHAGE Take 1,000 mg by mouth 2 (two) times daily with a meal.   MULTI-VITAMINS Tabs Take 1 tablet by mouth daily.   pioglitazone 45 MG tablet Commonly known as:  ACTOS Take 45 mg by mouth daily.   sildenafil 50 MG tablet Commonly known as:  VIAGRA Take 50 mg by mouth daily as needed for erectile dysfunction.   testosterone cypionate 200 MG/ML injection Commonly known as:  DEPOTESTOSTERONE CYPIONATE Inject 1 mL (200 mg total) into the muscle every 14 (fourteen) days.       Allergies:  Allergies  Allergen Reactions  . Bupropion     Racing heart    Family History: Family History  Problem Relation Age of Onset  . Cancer Mother   . Hypertension Mother   . Arthritis Father   . Asthma Father   . Cancer Father   . COPD Father   . Heart attack Father   .  Hypertension Sister   . Cancer Sister   . Diabetes Sister   . Asthma Son   . Birth defects Maternal Grandfather   . Arthritis Paternal Grandmother   . Diabetes Paternal Grandmother   . Arthritis Paternal Grandfather   . Asthma Sister   . Cancer Sister   . COPD Sister   . Arthritis Sister   . Asthma Sister   . Diabetes Sister     Social History:  reports that he has quit smoking. He has never used smokeless tobacco. He reports that he drinks alcohol. His drug history is not on file.  ROS: UROLOGY Frequent Urination?: Yes Hard to postpone urination?: No Burning/pain with urination?: No Get up at night to urinate?: Yes Leakage of urine?: No Urine stream starts and stops?: No Trouble starting stream?: No Do you have to strain to urinate?: No Blood in urine?: No Urinary tract  infection?: No Sexually transmitted disease?: No Injury to kidneys or bladder?: No Painful intercourse?: No Weak stream?: No Erection problems?: Yes Penile pain?: No  Gastrointestinal Nausea?: No Vomiting?: No Indigestion/heartburn?: No Diarrhea?: No Constipation?: No  Constitutional Fever: No Night sweats?: No Weight loss?: No Fatigue?: No  Skin Skin rash/lesions?: No Itching?: No  Eyes Blurred vision?: No Double vision?: No  Ears/Nose/Throat Sore throat?: No Sinus problems?: No  Hematologic/Lymphatic Swollen glands?: No Easy bruising?: No  Cardiovascular Leg swelling?: No Chest pain?: No  Respiratory Cough?: No Shortness of breath?: No  Endocrine Excessive thirst?: No  Musculoskeletal Back pain?: Yes Joint pain?: Yes  Neurological Headaches?: No Dizziness?: No  Psychologic Depression?: No Anxiety?: No  Physical Exam: BP (!) 162/85   Pulse 87   Ht 5\' 9"  (1.753 m)   Wt 280 lb (127 kg)   BMI 41.35 kg/m   Constitutional:  Alert and oriented, No acute distress. HEENT:  AT, moist mucus membranes.  Trachea midline, no masses. Cardiovascular: No clubbing, cyanosis, or edema. Respiratory: Normal respiratory effort, no increased work of breathing. GI: Abdomen is soft, nontender, nondistended, no abdominal masses; GU: No CVA tenderness.  Penis circumcised without lesions; testes descended bilaterally without masses or tenderness cord/epididymes palpably normal.  Prostate 40 g, smooth without nodules Skin: No rashes, bruises or suspicious lesions. Lymph: No cervical or inguinal adenopathy. Neurologic: Grossly intact, no focal deficits, moving all 4 extremities. Psychiatric: Normal mood and affect.  Laboratory Data: No results found for: WBC, HGB, HCT, MCV, PLT  Lab Results  Component Value Date   CREATININE 1.29 09/09/2017    Lab Results  Component Value Date   HGBA1C 8.2 (H) 09/09/2017    Assessment & Plan:    1. Male  hypogonadism  Potential side effects of testosterone replacement were discussed including stimulation of benign prostatic growth with lower urinary tract symptoms; erythrocytosis; edema; gynecomastia; worsening sleep apnea; venous thromboembolism; testicular atrophy and infertility. Recent studies suggesting an increased incidence of heart attack and stroke in patients taking testosterone was discussed. He was informed there is conflicting evidence regarding the impact of testosterone therapy on cardiovascular risk. The theoretical risk of growth stimulation of an undetected prostate cancer was also discussed.  He was informed that current evidence does not provide any definitive answers regarding the risks of testosterone therapy on prostate cancer and cardiovascular disease. The need for periodic monitoring of his testosterone level, PSA, hematocrit and DRE was discussed. Testosterone hematocrit and PSA were drawn today.  If stable follow-up 6 months for blood work in 1 year for office visit.  - Testosterone -  Hematocrit  2. Benign prostatic hyperplasia without lower urinary tract symptoms  - PSA   Return in about 1 year (around 10/06/2018).  Abbie Sons, [place] [address] [place], [place] [address] [phone]

## 2017-10-07 ENCOUNTER — Ambulatory Visit: Payer: Medicare Other

## 2017-10-07 DIAGNOSIS — R2689 Other abnormalities of gait and mobility: Secondary | ICD-10-CM

## 2017-10-07 DIAGNOSIS — M5386 Other specified dorsopathies, lumbar region: Secondary | ICD-10-CM

## 2017-10-07 DIAGNOSIS — G8929 Other chronic pain: Secondary | ICD-10-CM | POA: Diagnosis not present

## 2017-10-07 DIAGNOSIS — M545 Low back pain: Secondary | ICD-10-CM | POA: Diagnosis not present

## 2017-10-07 LAB — TESTOSTERONE: Testosterone: 67 ng/dL — ABNORMAL LOW (ref 264–916)

## 2017-10-07 LAB — HEMATOCRIT: Hematocrit: 42.7 % (ref 37.5–51.0)

## 2017-10-07 LAB — PSA: PROSTATE SPECIFIC AG, SERUM: 1.4 ng/mL (ref 0.0–4.0)

## 2017-10-07 NOTE — Therapy (Signed)
[facility] MAIN [facility] SERVICES [address] [place], [place], [address] Phone: [phone]   Fax:  [phone]  Physical Therapy Treatment  Patient Details  Name: Adam Wise MRN: [id number] Date of Birth: June 24, 1951 Referring Provider: Doreene Nest, NP  Encounter Date: 10/07/2017      PT End of Session - 10/07/17 1523    Visit Number 7   Number of Visits 16   Date for PT Re-Evaluation 11/11/17   Authorization - Visit Number 7   Authorization - Number of Visits 10   PT Start Time 1515   PT Stop Time 1600   PT Time Calculation (min) 45 min   Equipment Utilized During Treatment Gait belt   Activity Tolerance Patient tolerated treatment well   Behavior During Therapy [facility] for tasks assessed/performed      Past Medical History:  Diagnosis Date  . Chickenpox   . Chronic knee pain   . Chronic low back pain   . Essential hypertension   . GERD (gastroesophageal reflux disease)   . OSA (obstructive sleep apnea)   . Testosterone deficiency   . Type 2 diabetes mellitus (HCC)     Past Surgical History:  Procedure Laterality Date  . REPLACEMENT TOTAL KNEE BILATERAL  2015  . TONSILLECTOMY  1960    There were no vitals filed for this visit.      Subjective Assessment - 10/07/17 1517    Subjective Patient feeling sore in R knee since last session from stretching. Only woke up once last night from 1-2/10 pain.    Patient is accompained by: Family member   Pertinent History Patient is a pleasant 66 year old male who presents to physical therapy for low back pain. Had PT 6 months ago for his back in [place]. Reports having sciatica two years ago in 2016, went away with acupuncture. Right IT band has been tight ever since 2016. Pain occurs across upper lumbar and will spread to between shoulder blades with occasional tingling in fingers when sleeping on back at night. Pt. Has a history of two knee replacements and was a Set designer. He and his wife recently moved to [place] and a currently living with their daughter while searching for a home.    Limitations Sitting;Lifting;Standing;Walking;House hold activities   How long can you sit comfortably? getting up from sitting is bothersome   How long can you stand comfortably? half hour   How long can you walk comfortably? 15 minutes   Diagnostic tests X ray   Patient Stated Goals be able to pick things back up   Currently in Pain? No/denies        Manual PROM 60-90 second holds hamstring, popliteal angle, dorsiflexion overpressure, IT band, Ab, Ad,  Distraction with belt, SAD, lateral and inferior multiple angles 5x20 second holds   Mobilizations to thoracolumbar spine grade I-II   TherEx  90 90 neural glides 5x5.  TrA activation with swiss ball 10x 5 second holds Hip flexor stretch 2 x60  Scapular retractions 10x Stair negotiation : require single UE ascending, bilateral UE descending. Cues for abdominal activation and scapular retraction.  Posterior pelvic tilt 10x      Pt. response to medical necessity: Patient will continue to benefit from skilled physical therapy to decrease pain and improve quality of life                       PT Education - 10/07/17 1518  Education provided Yes   Education Details Customer service manager) Educated Patient   Methods Explanation;Demonstration;Verbal cues   Comprehension Verbalized understanding;Returned demonstration          PT Short Term Goals - 10/07/17 1531      PT SHORT TERM GOAL #1   Title Patient will be independent in home exercise program to improve strength/mobility for better functional independence with ADLs.   Baseline HEP given   Time 2   Period Weeks   Status Partially Met     PT SHORT TERM GOAL #2   Title Patient's average low back pain will decrease to 3/10 to improve quality of life.   Baseline average/worst 1/10    Time 2   Period Weeks    Status Achieved           PT Long Term Goals - 09/16/17 1551      PT LONG TERM GOAL #1   Title Patient will report a worst pain of 3/10 on VAS in lumbar region to improve tolerance with ADLs and reduced symptoms with activities   Baseline 10/3: 8/10   Time 8   Period Weeks   Status New   Target Date 11/11/17     PT LONG TERM GOAL #2   Title Patient will reduce modified Oswestry score to <20 as to demonstrate minimal disability with ADLs including improved sleeping tolerance, walking/sitting tolerance etc for better mobility with ADLs.    Baseline 10/3: 22%   Time 8   Period Weeks   Status New   Target Date 11/11/17     PT LONG TERM GOAL #3   Title Patient will decrease FABQ to 30/96 to demonstrate decreased disability and fear of movement fo rimproved quality of life.    Baseline 10/3: 42/96   Time 8   Period Weeks   Status New   Target Date 11/11/17     PT LONG TERM GOAL #4   Title Patient will be able to perform household work/ chores without increase in symptoms.   Baseline 10/3: pain with movement   Time 8   Period Weeks   Status New   Target Date 11/11/17               Plan - 10/07/17 1532    Clinical Impression Statement Patient soreness noted throughout session limited PROM and neural glides. RLE increased tightness compared to left, slight back pain elicited in supine position, relieved with positioning. Patient will continue to benefit from skilled physical therapy to decrease pain and improve quality of movement .   Rehab Potential Good   Clinical Impairments Affecting Rehab Potential (-) HTN, reflux, bilateral TKA, diabetes, history of chronic back pain,recent move (+) good family support and understanding of medical field   PT Frequency 2x / week   PT Duration 8 weeks   PT Treatment/Interventions ADLs/Self Care Home Management;Aquatic Therapy;Cryotherapy;Electrical Stimulation;Iontophoresis '4mg'$ /ml Dexamethasone;Moist Heat;Traction;Ultrasound;DME  Instruction;Balance training;Therapeutic exercise;Therapeutic activities;Functional mobility training;Stair training;Gait training;Neuromuscular re-education;Patient/family education;Manual techniques;Passive range of motion;Dry needling;Energy conservation   PT Next Visit Plan Mobilizations of spine, STM to lumbar region, distraction   PT Home Exercise Plan see sheet   Consulted and Agree with Plan of Care Patient;Family member/caregiver   Family Member Consulted wife      Patient will benefit from skilled therapeutic intervention in order to improve the following deficits and impairments:  Abnormal gait, Decreased activity tolerance, Decreased balance, Decreased endurance, Decreased knowledge of use of DME, Decreased mobility, Decreased range of motion,  Difficulty walking, Decreased strength, Hypomobility, Impaired flexibility, Impaired perceived functional ability, Increased muscle spasms, Postural dysfunction, Pain, Increased fascial restricitons  Visit Diagnosis: Chronic bilateral low back pain without sciatica  Decreased range of motion of lumbar spine  Other abnormalities of gait and mobility     Problem List Patient Active Problem List   Diagnosis Date Noted  . Essential hypertension 09/09/2017  . Type 2 diabetes mellitus ([place]) 09/09/2017  . GERD (gastroesophageal reflux disease) 09/09/2017  . Testosterone deficiency 09/09/2017  . OSA (obstructive sleep apnea) 09/09/2017  . Chronic bilateral low back pain without sciatica 09/09/2017  . H/O knee surgery 04/27/2014  . 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 [place], [place], [address] Phone: [phone]   Fax:  [phone]  Name: Adam Wise MRN: [id number] Date of Birth: May 30, 1951

## 2017-10-08 ENCOUNTER — Telehealth: Payer: Self-pay | Admitting: *Deleted

## 2017-10-08 DIAGNOSIS — E291 Testicular hypofunction: Secondary | ICD-10-CM

## 2017-10-08 NOTE — Telephone Encounter (Signed)
Spoke with patient and gave results. Patient states he has never had a MRI of the pituitary gland.

## 2017-10-08 NOTE — Telephone Encounter (Signed)
-----   Message from Abbie Sons, MD sent at 10/08/2017  1:28 PM EDT ----- PSA was low at 56 which is extremely low for someone on testosterone replacement.  Has he ever had an MRI of his pituitary gland?

## 2017-10-09 NOTE — Telephone Encounter (Signed)
Recommend scheduling pituitary MRI-will call with results

## 2017-10-09 NOTE — Telephone Encounter (Signed)
Scheduling to call the patient for the appointment  Adam Wise

## 2017-10-12 ENCOUNTER — Ambulatory Visit: Payer: Medicare Other

## 2017-10-12 DIAGNOSIS — M545 Low back pain, unspecified: Secondary | ICD-10-CM

## 2017-10-12 DIAGNOSIS — R2689 Other abnormalities of gait and mobility: Secondary | ICD-10-CM

## 2017-10-12 DIAGNOSIS — G8929 Other chronic pain: Secondary | ICD-10-CM

## 2017-10-12 DIAGNOSIS — M5386 Other specified dorsopathies, lumbar region: Secondary | ICD-10-CM

## 2017-10-12 NOTE — Therapy (Signed)
[place] MAIN [facility] SERVICES [address] [place], [place], [address] Phone: [phone]   Fax:  [phone]  Physical Therapy Treatment  Patient Details  Name: Adam Wise MRN: [id number] Date of Birth: October 11, 1951 Referring Provider: [name], NP  Encounter Date: 10/12/2017      PT End of Session - 10/12/17 1541    Visit Number 8   Number of Visits 16   Date for PT Re-Evaluation 11/11/17   Authorization - Visit Number 8   Authorization - Number of Visits 10   PT Start Time 1660   PT Stop Time 1600   PT Time Calculation (min) 45 min   Equipment Utilized During Treatment Gait belt   Activity Tolerance Patient tolerated treatment well   Behavior During Therapy [facility] for tasks assessed/performed      Past Medical History:  Diagnosis Date  . Chickenpox   . Chronic knee pain   . Chronic low back pain   . Essential hypertension   . GERD (gastroesophageal reflux disease)   . OSA (obstructive sleep apnea)   . Testosterone deficiency   . Type 2 diabetes mellitus ([place])     Past Surgical History:  Procedure Laterality Date  . REPLACEMENT TOTAL KNEE BILATERAL  2015  . TONSILLECTOMY  1960    There were no vitals filed for this visit.      Subjective Assessment - 10/12/17 1518    Subjective Patient having health problems that are worrying him. Has not been sleeping well due to worrying. Need to get MRI of the brain.    Patient is accompained by: Family member   Pertinent History Patient is a pleasant 66 year old male who presents to physical therapy for low back pain. Had PT 6 months ago for his back in [place]. Reports having sciatica two years ago in 2016, went away with acupuncture. Right IT band has been tight ever since 2016. Pain occurs across upper lumbar and will spread to between shoulder blades with occasional tingling in fingers when sleeping on back at night. Pt. Has a history of two knee replacements and was  a Public librarian. He and his wife recently moved to [place] and a currently living with their daughter while searching for a home.    Limitations Sitting;Lifting;Standing;Walking;House hold activities   How long can you sit comfortably? getting up from sitting is bothersome   How long can you stand comfortably? half hour   How long can you walk comfortably? 15 minutes   Diagnostic tests X ray   Patient Stated Goals be able to pick things back up   Currently in Pain? No/denies       Manual PROM 60-90 second holds hamstring, popliteal angle, dorsiflexion overpressure, IT band, Ab, Ad,  Distraction with belt, SAD, lateral and inferior multiple angles 5x20 second holds   Mobilizations to thoracolumbar spine grade I-II Roller to IT band   TherEx  90 90 neural glides 5x5.  Hip flexor stretch 2 x60  Scapular retractions 10x     Pt. response to medical necessity: Patient will continue to benefit from skilled physical therapy to decrease pain and improve quality of life                            PT Education - 10/12/17 1541    Education provided Yes   Education Details R hip cramp relief   Person(s) Educated Patient  Methods Explanation;Demonstration;Verbal cues   Comprehension Verbalized understanding;Returned demonstration          PT Short Term Goals - 10/07/17 1531      PT SHORT TERM GOAL #1   Title Patient will be independent in home exercise program to improve strength/mobility for better functional independence with ADLs.   Baseline HEP given   Time 2   Period Weeks   Status Partially Met     PT SHORT TERM GOAL #2   Title Patient's average low back pain will decrease to 3/10 to improve quality of life.   Baseline average/worst 1/10    Time 2   Period Weeks   Status Achieved           PT Long Term Goals - 09/16/17 1551      PT LONG TERM GOAL #1   Title Patient will report a worst pain of 3/10 on VAS in lumbar region to  improve tolerance with ADLs and reduced symptoms with activities   Baseline 10/3: 8/10   Time 8   Period Weeks   Status New   Target Date 11/11/17     PT LONG TERM GOAL #2   Title Patient will reduce modified Oswestry score to <20 as to demonstrate minimal disability with ADLs including improved sleeping tolerance, walking/sitting tolerance etc for better mobility with ADLs.    Baseline 10/3: 22%   Time 8   Period Weeks   Status New   Target Date 11/11/17     PT LONG TERM GOAL #3   Title Patient will decrease FABQ to 30/96 to demonstrate decreased disability and fear of movement fo rimproved quality of life.    Baseline 10/3: 42/96   Time 8   Period Weeks   Status New   Target Date 11/11/17     PT LONG TERM GOAL #4   Title Patient will be able to perform household work/ chores without increase in symptoms.   Baseline 10/3: pain with movement   Time 8   Period Weeks   Status New   Target Date 11/11/17               Plan - 10/12/17 1605    Clinical Impression Statement Patient has R IT and hip pain causing one episode of "locking up" to occur during session. Patient unable to move R hip at all during session and required Max A to move leg,moving into flexion decreased and alleviated "locking up sensation". Recommended patient contact doctor. Patient will continue to benefit from skilled physical therapy to decrease pain and improve quality of life   Rehab Potential Good   Clinical Impairments Affecting Rehab Potential (-) HTN, reflux, bilateral TKA, diabetes, history of chronic back pain,recent move (+) good family support and understanding of medical field   PT Frequency 2x / week   PT Duration 8 weeks   PT Treatment/Interventions ADLs/Self Care Home Management;Aquatic Therapy;Cryotherapy;Electrical Stimulation;Iontophoresis '4mg'$ /ml Dexamethasone;Moist Heat;Traction;Ultrasound;DME Instruction;Balance training;Therapeutic exercise;Therapeutic activities;Functional mobility  training;Stair training;Gait training;Neuromuscular re-education;Patient/family education;Manual techniques;Passive range of motion;Dry needling;Energy conservation   PT Next Visit Plan Nustep and leg press   PT Home Exercise Plan see sheet   Consulted and Agree with Plan of Care Patient;Family member/caregiver   Family Member Consulted wife      Patient will benefit from skilled therapeutic intervention in order to improve the following deficits and impairments:  Abnormal gait, Decreased activity tolerance, Decreased balance, Decreased endurance, Decreased knowledge of use of DME, Decreased mobility, Decreased range of motion, Difficulty  walking, Decreased strength, Hypomobility, Impaired flexibility, Impaired perceived functional ability, Increased muscle spasms, Postural dysfunction, Pain, Increased fascial restricitons  Visit Diagnosis: Chronic bilateral low back pain without sciatica  Decreased range of motion of lumbar spine  Other abnormalities of gait and mobility     Problem List Patient Active Problem List   Diagnosis Date Noted  . Essential hypertension 09/09/2017  . Type 2 diabetes mellitus ([place]) 09/09/2017  . GERD (gastroesophageal reflux disease) 09/09/2017  . Testosterone deficiency 09/09/2017  . OSA (obstructive sleep apnea) 09/09/2017  . Chronic bilateral low back pain without sciatica 09/09/2017  . H/O knee surgery 04/27/2014  . 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 [place], [place], [address] Phone: [phone]   Fax:  [phone]  Name: Adam Wise MRN: [id number] Date of Birth: [birth year]

## 2017-10-14 ENCOUNTER — Ambulatory Visit: Payer: Medicare Other

## 2017-10-14 DIAGNOSIS — G8929 Other chronic pain: Secondary | ICD-10-CM

## 2017-10-14 DIAGNOSIS — M545 Low back pain: Secondary | ICD-10-CM | POA: Diagnosis not present

## 2017-10-14 DIAGNOSIS — R2689 Other abnormalities of gait and mobility: Secondary | ICD-10-CM

## 2017-10-14 DIAGNOSIS — M5386 Other specified dorsopathies, lumbar region: Secondary | ICD-10-CM

## 2017-10-14 NOTE — Therapy (Signed)
[facility] MAIN [facility] SERVICES [address] [place], [place], [address] Phone: [phone]   Fax:  [phone]  Physical Therapy Treatment  Patient Details  Name: Adam Wise MRN: [id number] Date of Birth: [birth year] Referring Provider: Doreene Nest, NP  Encounter Date: 10/14/2017      PT End of Session - 10/14/17 1520    Visit Number 9   Number of Visits 16   Date for PT Re-Evaluation 11/11/17   Authorization - Visit Number 9   Authorization - Number of Visits 10   PT Start Time 1515   PT Stop Time 1600   PT Time Calculation (min) 45 min   Equipment Utilized During Treatment Gait belt   Activity Tolerance Patient tolerated treatment well   Behavior During Therapy [facility] for tasks assessed/performed      Past Medical History:  Diagnosis Date  . Chickenpox   . Chronic knee pain   . Chronic low back pain   . Essential hypertension   . GERD (gastroesophageal reflux disease)   . OSA (obstructive sleep apnea)   . Testosterone deficiency   . Type 2 diabetes mellitus (HCC)     Past Surgical History:  Procedure Laterality Date  . REPLACEMENT TOTAL KNEE BILATERAL  2015  . TONSILLECTOMY  1960    There were no vitals filed for this visit.      Subjective Assessment - 10/14/17 1519    Subjective Patient gets MRI tomorrow of brain. Has been doing his HEP. IT band feeling better after last session.    Patient is accompained by: Family member   Pertinent History Patient is a pleasant 66 year old male who presents to physical therapy for low back pain. Had PT 6 months ago for his back in [place]. Reports having sciatica two years ago in 2016, went away with acupuncture. Right IT band has been tight ever since 2016. Pain occurs across upper lumbar and will spread to between shoulder blades with occasional tingling in fingers when sleeping on back at night. Pt. 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 He and his wife recently moved to [place] and a currently living with their daughter while searching for a home.    Limitations Sitting;Lifting;Standing;Walking;House hold activities   How long can you sit comfortably? getting up from sitting is bothersome   How long can you stand comfortably? half hour   How long can you walk comfortably? 15 minutes   Diagnostic tests X ray   Patient Stated Goals be able to pick things back up   Currently in Pain? Yes   Pain Score 1    Pain Location Back   Pain Orientation Lower   Pain Descriptors / Indicators Aching   Pain Type Chronic pain   Pain Onset More than a month ago   Pain Frequency Intermittent       Nustep 3 minutes level 4    Manual PROM 60-90 second holds hamstring, popliteal angle, dorsiflexion overpressure, IT band, Ab, Ad,  Distraction with belt, SAD, lateral and inferior multiple angles 5x20 second holds   Mobilizations to thoracolumbar spine grade I-II Roller to IT band Prone hip flexor stretch   TherEx  90 90 neural glides 5x5.  Scapular retractions 10x TrA activation swiss ball 10x 5 second   TrA activation with UE raises 10x  10 sit to stands from plinth cues for putting weight onto left leg.     Pt. response  to medical necessity: Patient will continue to benefit from skilled physical therapy to decrease pain and improve quality of life                          PT Short Term Goals - 10/07/17 1531      PT SHORT TERM GOAL #1   Title Patient will be independent in home exercise program to improve strength/mobility for better functional independence with ADLs.   Baseline HEP given   Time 2   Period Weeks   Status Partially Met     PT SHORT TERM GOAL #2   Title Patient's average low back pain will decrease to 3/10 to improve quality of life.   Baseline average/worst 1/10    Time 2   Period Weeks   Status Achieved           PT Long Term Goals - 09/16/17 1551      PT  LONG TERM GOAL #1   Title Patient will report a worst pain of 3/10 on VAS in lumbar region to improve tolerance with ADLs and reduced symptoms with activities   Baseline 10/3: 8/10   Time 8   Period Weeks   Status New   Target Date 11/11/17     PT LONG TERM GOAL #2   Title Patient will reduce modified Oswestry score to <20 as to demonstrate minimal disability with ADLs including improved sleeping tolerance, walking/sitting tolerance etc for better mobility with ADLs.    Baseline 10/3: 22%   Time 8   Period Weeks   Status New   Target Date 11/11/17     PT LONG TERM GOAL #3   Title Patient will decrease FABQ to 30/96 to demonstrate decreased disability and fear of movement fo rimproved quality of life.    Baseline 10/3: 42/96   Time 8   Period Weeks   Status New   Target Date 11/11/17     PT LONG TERM GOAL #4   Title Patient will be able to perform household work/ chores without increase in symptoms.   Baseline 10/3: pain with movement   Time 8   Period Weeks   Status New   Target Date 11/11/17               Plan - 10/14/17 1537    Clinical Impression Statement Patient progressing with functional movement with decreased back pain. Improved flexibility of LE's noted throughout session. L IT band tightness improved, however continues to limit patient's ability to perform certain treatment interventions. Patient will continue to benefit from skilled physical therapy to decrease pain and improve quality of life   Rehab Potential Good   Clinical Impairments Affecting Rehab Potential (-) HTN, reflux, bilateral TKA, diabetes, history of chronic back pain,recent move (+) good family support and understanding of medical field   PT Frequency 2x / week   PT Duration 8 weeks   PT Treatment/Interventions ADLs/Self Care Home Management;Aquatic Therapy;Cryotherapy;Electrical Stimulation;Iontophoresis '4mg'$ /ml Dexamethasone;Moist Heat;Traction;Ultrasound;DME Instruction;Balance  training;Therapeutic exercise;Therapeutic activities;Functional mobility training;Stair training;Gait training;Neuromuscular re-education;Patient/family education;Manual techniques;Passive range of motion;Dry needling;Energy conservation   PT Next Visit Plan Nustep and leg press   PT Home Exercise Plan see sheet   Consulted and Agree with Plan of Care Patient;Family member/caregiver   Family Member Consulted wife      Patient will benefit from skilled therapeutic intervention in order to improve the following deficits and impairments:  Abnormal gait, Decreased activity tolerance, Decreased balance, Decreased endurance,  Decreased knowledge of use of DME, Decreased mobility, Decreased range of motion, Difficulty walking, Decreased strength, Hypomobility, Impaired flexibility, Impaired perceived functional ability, Increased muscle spasms, Postural dysfunction, Pain, Increased fascial restricitons  Visit Diagnosis: Chronic bilateral low back pain without sciatica  Decreased range of motion of lumbar spine  Other abnormalities of gait and mobility     Problem List Patient Active Problem List   Diagnosis Date Noted  . Essential hypertension 09/09/2017  . Type 2 diabetes mellitus ([place]) 09/09/2017  . GERD (gastroesophageal reflux disease) 09/09/2017  . Testosterone deficiency 09/09/2017  . OSA (obstructive sleep apnea) 09/09/2017  . Chronic bilateral low back pain without sciatica 09/09/2017  . H/O knee surgery 04/27/2014  . 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

## 2017-10-15 ENCOUNTER — Ambulatory Visit
Admission: RE | Admit: 2017-10-15 | Discharge: 2017-10-15 | Disposition: A | Discharge status: Home / Self Care | Payer: Medicare Other | Source: Ambulatory Visit | Attending: Urology | Admitting: Urology

## 2017-10-15 DIAGNOSIS — R51 Headache: Secondary | ICD-10-CM | POA: Diagnosis not present

## 2017-10-15 DIAGNOSIS — G930 Cerebral cysts: Secondary | ICD-10-CM | POA: Diagnosis not present

## 2017-10-15 DIAGNOSIS — E291 Testicular hypofunction: Secondary | ICD-10-CM | POA: Diagnosis not present

## 2017-10-15 MED ORDER — GADOBENATE DIMEGLUMINE 529 MG/ML IV SOLN
10.0000 mL | Freq: Once | INTRAVENOUS | Status: AC | PRN
  Administered 2017-10-15: 10 mL via INTRAVENOUS

## 2017-10-19 ENCOUNTER — Ambulatory Visit: Payer: Medicare Other | Attending: Primary Care

## 2017-10-19 DIAGNOSIS — M545 Low back pain, unspecified: Secondary | ICD-10-CM

## 2017-10-19 DIAGNOSIS — M5386 Other specified dorsopathies, lumbar region: Secondary | ICD-10-CM

## 2017-10-19 DIAGNOSIS — G8929 Other chronic pain: Secondary | ICD-10-CM

## 2017-10-19 DIAGNOSIS — R2689 Other abnormalities of gait and mobility: Secondary | ICD-10-CM | POA: Diagnosis not present

## 2017-10-19 NOTE — Therapy (Signed)
[place] MAIN [facility] SERVICES [address] [place], [place], [address] Phone: [phone]   Fax:  [phone]  Physical Therapy Treatment  Patient Details  Name: Adam Wise MRN: [id number] Date of Birth: 08-24-1951 Referring Provider: [name], NP   Encounter Date: 10/19/2017  PT End of Session - 10/19/17 1525    Visit Number  10    Number of Visits  16    Date for PT Re-Evaluation  11/11/17    Authorization Type  next will be 1/10    Authorization - Visit Number  10    Authorization - Number of Visits  10    PT Start Time  6433    PT Stop Time  1600    PT Time Calculation (min)  45 min    Equipment Utilized During Treatment  Gait belt    Activity Tolerance  Patient tolerated treatment well    Behavior During Therapy  [facility] for tasks assessed/performed       Past Medical History:  Diagnosis Date  . Chickenpox   . Chronic knee pain   . Chronic low back pain   . Essential hypertension   . GERD (gastroesophageal reflux disease)   . OSA (obstructive sleep apnea)   . Testosterone deficiency   . Type 2 diabetes mellitus ([place])     Past Surgical History:  Procedure Laterality Date  . REPLACEMENT TOTAL KNEE BILATERAL  2015  . TONSILLECTOMY  1960    There were no vitals filed for this visit.       Worst pain 2/10 in past week   MODI: 12%  FABQ 17/30, 4/66; 21/96 Subjective Assessment - 10/19/17 1520    Subjective  Patient had MRI on friday, hurt IT band friday at car auction when walking on concrete for a few hours.     Patient is accompained by:  Family member    Pertinent History  Patient is a pleasant 66 year old male who presents to physical therapy for low back pain. Had PT 6 months ago for his back in [place]. Reports having sciatica two years ago in 2016, went away with acupuncture. Right IT band has been tight ever since 2016. Pain occurs across upper lumbar and will spread to between shoulder blades  with occasional tingling in fingers when sleeping on back at night. 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     Limitations  Sitting;Lifting;Standing;Walking;House hold activities    How long can you sit comfortably?  getting up from sitting is bothersome    How long can you stand comfortably?  half hour    How long can you walk comfortably?  15 minutes    Diagnostic tests  X ray    Patient Stated Goals  be able to pick things back up    Currently in Pain?  Yes    Pain Score  1     Pain Location  Back    Pain Orientation  Lower    Pain Descriptors / Indicators  Aching    Pain Type  Chronic pain    Pain Onset  More than a month ago    Pain Frequency  Intermittent       Nustep 3 minutes level 4  Manual PROM 60-90 second holds hamstring, popliteal angle, dorsiflexion overpressure, IT band, Ab,  Ad,  Distraction with belt, SAD, lateral and inferior multiple angles 5x20 second holds  Mobilizations to thoracolumbar spine grade I-II Roller to IT band Prone hip flexor stretch  TherEx 90 90 neural glides 5x5.  TrA activation swiss ball 10x 5 second  leg press 150 lb 1x15, cues for 4 second concentric and 4 second eccentric phase   Pt. response to medical necessity: Patient will continue to benefit from skilled physical therapy to decrease pain and improve quality of life                       PT Education - 10/19/17 1522    Education provided  Yes    Education Details  POC, IT band stretching    Person(s) Educated  Patient    Methods  Explanation;Demonstration;Verbal cues    Comprehension  Verbalized understanding;Returned demonstration       PT Short Term Goals - 10/07/17 1531      PT SHORT TERM GOAL #1   Title  Patient will be independent in home exercise program to improve strength/mobility for  better functional independence with ADLs.    Baseline  HEP given    Time  2    Period  Weeks    Status  Partially Met      PT SHORT TERM GOAL #2   Title  Patient's average low back pain will decrease to 3/10 to improve quality of life.    Baseline  average/worst 1/10     Time  2    Period  Weeks    Status  Achieved        PT Long Term Goals - 10/19/17 1528      PT LONG TERM GOAL #1   Title  Patient will report a worst pain of 3/10 on VAS in lumbar region to improve tolerance with ADLs and reduced symptoms with activities    Baseline  10/3: 8/10; 11/5 worst 2/10    Time  8    Period  Weeks    Status  Achieved      PT LONG TERM GOAL #2   Title  Patient will reduce modified Oswestry score to <20 as to demonstrate minimal disability with ADLs including improved sleeping tolerance, walking/sitting tolerance etc for better mobility with ADLs.     Baseline  10/3: 22% 11/5: 12%    Time  8    Period  Weeks    Status  Achieved      PT LONG TERM GOAL #3   Title  Patient will decrease FABQ to 30/96 to demonstrate decreased disability and fear of movement fo rimproved quality of life.     Baseline  10/3: 42/96 11/5: 21/96    Time  8    Period  Weeks    Status  Achieved      PT LONG TERM GOAL #4   Title  Patient will be able to perform household work/ chores without increase in symptoms.    Baseline  10/3: pain with movement 11/5: increase to 2/10 symptoms    Time  8    Period  Weeks    Status  Partially Met            Plan - 10/19/17 1539    Clinical Impression Statement  Patient progressing towards goals at this time. R IT band continues to irritate patient with prolonged standing duration. Worst back pain is 2/10 in last week. MODI=12%, FABQ= 21/96. Patient LE mobility is improving  with core stability to decrease low back pain. Patient will continue to benefit from skilled physical therapy to decrease pain and improve quality of life    Rehab Potential  Good    Clinical  Impairments Affecting Rehab Potential  (-) HTN, reflux, bilateral TKA, diabetes, history of chronic back pain,recent move (+) good family support and understanding of medical field    PT Frequency  2x / week    PT Duration  8 weeks    PT Treatment/Interventions  ADLs/Self Care Home Management;Aquatic Therapy;Cryotherapy;Electrical Stimulation;Iontophoresis '4mg'$ /ml Dexamethasone;Moist Heat;Traction;Ultrasound;DME Instruction;Balance training;Therapeutic exercise;Therapeutic activities;Functional mobility training;Stair training;Gait training;Neuromuscular re-education;Patient/family education;Manual techniques;Passive range of motion;Dry needling;Energy conservation    PT Next Visit Plan  Nustep and leg press    PT Home Exercise Plan  see sheet    Consulted and Agree with Plan of Care  Patient;Family member/caregiver    Family Member Consulted  wife       Patient will benefit from skilled therapeutic intervention in order to improve the following deficits and impairments:  Abnormal gait, Decreased activity tolerance, Decreased balance, Decreased endurance, Decreased knowledge of use of DME, Decreased mobility, Decreased range of motion, Difficulty walking, Decreased strength, Hypomobility, Impaired flexibility, Impaired perceived functional ability, Increased muscle spasms, Postural dysfunction, Pain, Increased fascial restricitons  Visit Diagnosis: Chronic bilateral low back pain without sciatica  Decreased range of motion of lumbar spine  Other abnormalities of gait and mobility   G-Codes - 10-22-2017 1529    Functional Assessment Tool Used (Outpatient Only)  VAS, MODI, FABQ, clinical judgement    Functional Limitation  Mobility: Walking and moving around    Mobility: Walking and Moving Around Current Status [phone])  At least 20 percent but less than 40 percent impaired, limited or restricted    Mobility: Walking and Moving Around Goal Status [phone])  At least 1 percent but less than 20  percent impaired, limited or restricted       Problem List Patient Active Problem List   Diagnosis Date Noted  . Essential hypertension 09/09/2017  . Type 2 diabetes mellitus ([place]) 09/09/2017  . GERD (gastroesophageal reflux disease) 09/09/2017  . Testosterone deficiency 09/09/2017  . OSA (obstructive sleep apnea) 09/09/2017  . Chronic bilateral low back pain without sciatica 09/09/2017  . H/O knee surgery 04/27/2014  . 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 [place], [place], [address] Phone: [phone]   Fax:  [phone]  Name: Adam Wise MRN: [id number] Date of Birth: 20-Dec-1950

## 2017-10-21 ENCOUNTER — Ambulatory Visit: Payer: Medicare Other

## 2017-10-21 ENCOUNTER — Telehealth: Payer: Self-pay

## 2017-10-21 ENCOUNTER — Telehealth: Payer: Self-pay | Admitting: Urology

## 2017-10-21 DIAGNOSIS — R2689 Other abnormalities of gait and mobility: Secondary | ICD-10-CM

## 2017-10-21 DIAGNOSIS — G8929 Other chronic pain: Secondary | ICD-10-CM | POA: Diagnosis not present

## 2017-10-21 DIAGNOSIS — M5386 Other specified dorsopathies, lumbar region: Secondary | ICD-10-CM | POA: Diagnosis not present

## 2017-10-21 DIAGNOSIS — D352 Benign neoplasm of pituitary gland: Secondary | ICD-10-CM

## 2017-10-21 DIAGNOSIS — M545 Low back pain: Principal | ICD-10-CM

## 2017-10-21 NOTE — Telephone Encounter (Signed)
Spoke with pt in reference to MRI results and referral to endocrinology. Pt voiced understanding. Referral placed for a [facility] facility.

## 2017-10-21 NOTE — Therapy (Signed)
[place] MAIN [facility] SERVICES [address] [place], [place], [address] Phone: [phone]   Fax:  [phone]  Physical Therapy Treatment  Patient Details  Name: Adam Wise MRN: [id number] Date of Birth: 03-31-1951 Referring Provider: [name], NP   Encounter Date: 10/21/2017  PT End of Session - 10/21/17 1522    Visit Number  11    Number of Visits  16    Date for PT Re-Evaluation  11/11/17    Authorization - Visit Number  1    Authorization - Number of Visits  10    PT Start Time  4259    PT Stop Time  1600    PT Time Calculation (min)  45 min    Equipment Utilized During Treatment  Gait belt    Activity Tolerance  Patient tolerated treatment well    Behavior During Therapy  [facility] for tasks assessed/performed       Past Medical History:  Diagnosis Date  . Chickenpox   . Chronic knee pain   . Chronic low back pain   . Essential hypertension   . GERD (gastroesophageal reflux disease)   . OSA (obstructive sleep apnea)   . Testosterone deficiency   . Type 2 diabetes mellitus ([place])     Past Surgical History:  Procedure Laterality Date  . REPLACEMENT TOTAL KNEE BILATERAL  2015  . TONSILLECTOMY  1960    There were no vitals filed for this visit.  Subjective Assessment - 10/21/17 1521    Subjective  Patient feeling tight after last session, increased on lateral aspect of thigh.     Patient is accompained by:  Family member    Pertinent History  Patient is a pleasant 66 year old male who presents to physical therapy for low back pain. Had PT 6 months ago for his back in [place]. Reports having sciatica two years ago in 2016, went away with acupuncture. Right IT band has been tight ever since 2016. Pain occurs across upper lumbar and will spread to between shoulder blades with occasional tingling in fingers when sleeping on back at night. 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     Limitations  Sitting;Lifting;Standing;Walking;House hold activities    How long can you sit comfortably?  getting up from sitting is bothersome    How long can you stand comfortably?  half hour    How long can you walk comfortably?  15 minutes    Diagnostic tests  X ray    Patient Stated Goals  be able to pick things back up    Currently in Pain?  Yes    Pain Score  3     Pain Location  Leg    Pain Orientation  Right;Lateral    Pain Descriptors / Indicators  Aching;Tightness    Pain Type  Acute pain    Pain Onset  In the past 7 days    Pain Frequency  Intermittent          Nustep 3 minutes level 5   Manual PROM 60-90 second holds hamstring, popliteal angle, dorsiflexion overpressure, IT band, Ab, Ad,  Distraction with belt, SAD, lateral and inferior multiple angles 5x20 second holds   Mobilizations to thoracolumbar spine grade I-IV Roller to IT band and STM to IT band Prone hip flexor  stretch 2x 60 seconds   TherEx  90 90 neural glides 5x5.  TrA activation swiss ball 10x 5 second   bridge 10, cues for glute engagement Pelvic tilts 15x    Pt. response to medical necessity: Patient will continue to benefit from skilled physical therapy to decrease pain and improve quality of life                 PT Education - 10/21/17 1521    Education provided  Yes    Education Details  IT band stretches, squats     Person(s) Educated  Patient    Methods  Explanation;Demonstration    Comprehension  Verbalized understanding;Returned demonstration       PT Short Term Goals - 10/07/17 1531      PT SHORT TERM GOAL #1   Title  Patient will be independent in home exercise program to improve strength/mobility for better functional independence with ADLs.    Baseline  HEP given    Time  2    Period  Weeks    Status  Partially Met      PT SHORT TERM  GOAL #2   Title  Patient's average low back pain will decrease to 3/10 to improve quality of life.    Baseline  average/worst 1/10     Time  2    Period  Weeks    Status  Achieved        PT Long Term Goals - 10/19/17 1528      PT LONG TERM GOAL #1   Title  Patient will report a worst pain of 3/10 on VAS in lumbar region to improve tolerance with ADLs and reduced symptoms with activities    Baseline  10/3: 8/10; 11/5 worst 2/10    Time  8    Period  Weeks    Status  Achieved      PT LONG TERM GOAL #2   Title  Patient will reduce modified Oswestry score to <20 as to demonstrate minimal disability with ADLs including improved sleeping tolerance, walking/sitting tolerance etc for better mobility with ADLs.     Baseline  10/3: 22% 11/5: 12%    Time  8    Period  Weeks    Status  Achieved      PT LONG TERM GOAL #3   Title  Patient will decrease FABQ to 30/96 to demonstrate decreased disability and fear of movement fo rimproved quality of life.     Baseline  10/3: 42/96 11/5: 21/96    Time  8    Period  Weeks    Status  Achieved      PT LONG TERM GOAL #4   Title  Patient will be able to perform household work/ chores without increase in symptoms.    Baseline  10/3: pain with movement 11/5: increase to 2/10 symptoms    Time  8    Period  Weeks    Status  Partially Met            Plan - 10/21/17 1603    Clinical Impression Statement  Patient progressing with LE and lumbar strength. Tight lateral aspect of R thigh limiting patient in today's session. Low back pain not present during and at end of session. Patient will continue to benefit from skilled physical therapy to decrease pain and improve quality of life    Rehab Potential  Good    Clinical Impairments Affecting Rehab Potential  (-) HTN, reflux, bilateral TKA,  diabetes, history of chronic back pain,recent move (+) good family support and understanding of medical field    PT Frequency  2x / week    PT Duration  8 weeks     PT Treatment/Interventions  ADLs/Self Care Home Management;Aquatic Therapy;Cryotherapy;Electrical Stimulation;Iontophoresis 65m/ml Dexamethasone;Moist Heat;Traction;Ultrasound;DME Instruction;Balance training;Therapeutic exercise;Therapeutic activities;Functional mobility training;Stair training;Gait training;Neuromuscular re-education;Patient/family education;Manual techniques;Passive range of motion;Dry needling;Energy conservation    PT Next Visit Plan  Nustep and leg press    PT Home Exercise Plan  see sheet    Consulted and Agree with Plan of Care  Patient;Family member/caregiver    Family Member Consulted  wife       Patient will benefit from skilled therapeutic intervention in order to improve the following deficits and impairments:  Abnormal gait, Decreased activity tolerance, Decreased balance, Decreased endurance, Decreased knowledge of use of DME, Decreased mobility, Decreased range of motion, Difficulty walking, Decreased strength, Hypomobility, Impaired flexibility, Impaired perceived functional ability, Increased muscle spasms, Postural dysfunction, Pain, Increased fascial restricitons  Visit Diagnosis: Chronic bilateral low back pain without sciatica  Decreased range of motion of lumbar spine  Other abnormalities of gait and mobility     Problem List Patient Active Problem List   Diagnosis Date Noted  . Essential hypertension 09/09/2017  . [place] 2 diabetes [place] ([place] 09/09/2017  . GERD (gastroesophageal reflux disease) 09/09/2017  . Testosterone deficiency 09/09/2017  . OSA (obstructive sleep apnea) 09/09/2017  . Chronic bilateral low back pain without sciatica 09/09/2017  . H/O knee surgery 04/27/2014  . [place] 04/05/2014   MJanna Arch PT, DPT   MJanna Arch11/06/2017, 4:04 PM  [place] [facility] [address]RP[address] [place] [address]Phone: [phone]  Fax:   [phone] Name: SHatim HomannMRN: [id number] of Birth: 7December 11, 1952

## 2017-10-21 NOTE — Telephone Encounter (Signed)
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  Recommend an endocrinology evaluation.  Primary provider is [facility].  Would check to see who they prefer to use.

## 2017-10-21 NOTE — Telephone Encounter (Signed)
Referral has been sent to [facility] endocrinology in [place] and they will contact the patient with the app.  Sharyn Lull

## 2017-10-22 ENCOUNTER — Encounter: Payer: Self-pay | Admitting: Primary Care

## 2017-10-23 MED ORDER — AMLODIPINE BESYLATE 10 MG PO TABS: 10.0000 mg | ORAL_TABLET | Freq: Every day | ORAL | 1 refills | Status: DC

## 2017-10-26 ENCOUNTER — Ambulatory Visit: Payer: Medicare Other

## 2017-10-26 DIAGNOSIS — G8929 Other chronic pain: Secondary | ICD-10-CM | POA: Diagnosis not present

## 2017-10-26 DIAGNOSIS — R2689 Other abnormalities of gait and mobility: Secondary | ICD-10-CM | POA: Diagnosis not present

## 2017-10-26 DIAGNOSIS — M5386 Other specified dorsopathies, lumbar region: Secondary | ICD-10-CM | POA: Diagnosis not present

## 2017-10-26 DIAGNOSIS — M545 Low back pain: Principal | ICD-10-CM

## 2017-10-26 NOTE — Therapy (Signed)
[place] MAIN [facility] SERVICES [address] [place], [place], [address] Phone: [phone]   Fax:  [phone]  Physical Therapy Treatment  Patient Details  Name: Adam Wise MRN: [id number] Date of Birth: 05/30/1951 Referring Provider: [name], NP   Encounter Date: 10/26/2017  PT End of Session - 10/26/17 1518    Visit Number  12    Number of Visits  16    Date for PT Re-Evaluation  11/11/17    Authorization - Visit Number  2    Authorization - Number of Visits  10    PT Start Time  0240    PT Stop Time  1600    PT Time Calculation (min)  46 min    Equipment Utilized During Treatment  Gait belt    Activity Tolerance  Patient tolerated treatment well    Behavior During Therapy  [facility] for tasks assessed/performed       Past Medical History:  Diagnosis Date  . Chickenpox   . Chronic knee pain   . Chronic low back pain   . Essential hypertension   . GERD (gastroesophageal reflux disease)   . OSA (obstructive sleep apnea)   . Testosterone deficiency   . Type 2 diabetes mellitus ([place])     Past Surgical History:  Procedure Laterality Date  . REPLACEMENT TOTAL KNEE BILATERAL  2015  . TONSILLECTOMY  1960    There were no vitals filed for this visit.  Subjective Assessment - 10/26/17 1516    Subjective  Patient had a difficult time recovering after last session. Was feeling very sore after and tightened up.     Patient is accompained by:  Family member    Pertinent History  Patient is a pleasant 66 year old male who presents to physical therapy for low back pain. Had PT 6 months ago for his back in [place]. Reports having sciatica two years ago in 2016, went away with acupuncture. Right IT band has been tight ever since 2016. Pain occurs across upper lumbar and will spread to between shoulder blades with occasional tingling in fingers when sleeping on back at night. Pt. Has a history of two knee replacements and was a  Public librarian. He and his wife recently moved to [place] and a currently living with their daughter while searching for a home.     Limitations  Sitting;Lifting;Standing;Walking;House hold activities    How long can you sit comfortably?  getting up from sitting is bothersome    How long can you stand comfortably?  half hour    How long can you walk comfortably?  15 minutes    Diagnostic tests  X ray    Patient Stated Goals  be able to pick things back up    Currently in Pain?  Yes    Pain Score  1     Pain Location  Back    Pain Orientation  Right;Lateral    Pain Descriptors / Indicators  Aching    Pain Type  Chronic pain    Pain Onset  In the past 7 days       Nustep 3 minutes level 5    Manual PROM 60-90 second holds hamstring, popliteal angle, dorsiflexion overpressure, IT band, Ab, Ad,  Distraction with belt, SAD, lateral and inferior multiple angles 5x20 second holds   Mobilizations to thoracolumbar spine grade I-IV Roller to IT band and STM to IT band Prone hip flexor stretch 2x 60 seconds  TherEx  90 90 neural glides 5x5.  TrA activation swiss ball 10x 5 second   trunk flexion with swiss ball 10x , sidebend 6x  Pelvic tilts 15x    Pt. response to medical necessity: Patient will continue to benefit from skilled physical therapy to decrease pain and improve quality of life                        PT Education - 10/26/17 1517    Education provided  Yes    Education Details  HEP compliance, stretching,body mechanics    Person(s) Educated  Patient    Methods  Explanation;Demonstration;Verbal cues    Comprehension  Verbalized understanding;Returned demonstration       PT Short Term Goals - 10/07/17 1531      PT SHORT TERM GOAL #1   Title  Patient will be independent in home exercise program to improve strength/mobility for better functional independence with ADLs.    Baseline  HEP given    Time  2    Period  Weeks    Status   Partially Met      PT SHORT TERM GOAL #2   Title  Patient's average low back pain will decrease to 3/10 to improve quality of life.    Baseline  average/worst 1/10     Time  2    Period  Weeks    Status  Achieved        PT Long Term Goals - 10/19/17 1528      PT LONG TERM GOAL #1   Title  Patient will report a worst pain of 3/10 on VAS in lumbar region to improve tolerance with ADLs and reduced symptoms with activities    Baseline  10/3: 8/10; 11/5 worst 2/10    Time  8    Period  Weeks    Status  Achieved      PT LONG TERM GOAL #2   Title  Patient will reduce modified Oswestry score to <20 as to demonstrate minimal disability with ADLs including improved sleeping tolerance, walking/sitting tolerance etc for better mobility with ADLs.     Baseline  10/3: 22% 11/5: 12%    Time  8    Period  Weeks    Status  Achieved      PT LONG TERM GOAL #3   Title  Patient will decrease FABQ to 30/96 to demonstrate decreased disability and fear of movement fo rimproved quality of life.     Baseline  10/3: 42/96 11/5: 21/96    Time  8    Period  Weeks    Status  Achieved      PT LONG TERM GOAL #4   Title  Patient will be able to perform household work/ chores without increase in symptoms.    Baseline  10/3: pain with movement 11/5: increase to 2/10 symptoms    Time  8    Period  Weeks    Status  Partially Met            Plan - 10/26/17 1554    Clinical Impression Statement  Patient has slight increase in LE tightness requiring additional PROM for improved mobility. Discomfort of LE's decreased after manual tx. Patient will continue to benefit from skilled physical therapy to decrease pain and improve quality of life    Rehab Potential  Good    Clinical Impairments Affecting Rehab Potential  (-) HTN, reflux, bilateral TKA, diabetes, history of chronic  back pain,recent move (+) good family support and understanding of medical field    PT Frequency  2x / week    PT Duration  8 weeks     PT Treatment/Interventions  ADLs/Self Care Home Management;Aquatic Therapy;Cryotherapy;Electrical Stimulation;Iontophoresis '4mg'$ /ml Dexamethasone;Moist Heat;Traction;Ultrasound;DME Instruction;Balance training;Therapeutic exercise;Therapeutic activities;Functional mobility training;Stair training;Gait training;Neuromuscular re-education;Patient/family education;Manual techniques;Passive range of motion;Dry needling;Energy conservation    PT Next Visit Plan  Nustep and leg press    PT Home Exercise Plan  see sheet    Consulted and Agree with Plan of Care  Patient;Family member/caregiver    Family Member Consulted  wife       Patient will benefit from skilled therapeutic intervention in order to improve the following deficits and impairments:  Abnormal gait, Decreased activity tolerance, Decreased balance, Decreased endurance, Decreased knowledge of use of DME, Decreased mobility, Decreased range of motion, Difficulty walking, Decreased strength, Hypomobility, Impaired flexibility, Impaired perceived functional ability, Increased muscle spasms, Postural dysfunction, Pain, Increased fascial restricitons  Visit Diagnosis: Chronic bilateral low back pain without sciatica  Decreased range of motion of lumbar spine  Other abnormalities of gait and mobility     Problem List Patient Active Problem List   Diagnosis Date Noted  . Essential hypertension 09/09/2017  . Type 2 diabetes mellitus ([place]) 09/09/2017  . GERD (gastroesophageal reflux disease) 09/09/2017  . Testosterone deficiency 09/09/2017  . OSA (obstructive sleep apnea) 09/09/2017  . Chronic bilateral low back pain without sciatica 09/09/2017  . H/O knee surgery 04/27/2014  . 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 [place], [place], [address] Phone: [phone]   Fax:   [phone]  Name: Adam Wise MRN: [id number] Date of Birth: 11-25-1951

## 2017-10-28 ENCOUNTER — Encounter: Payer: Self-pay | Admitting: Primary Care

## 2017-10-28 ENCOUNTER — Ambulatory Visit: Payer: Medicare Other

## 2017-10-28 DIAGNOSIS — R2689 Other abnormalities of gait and mobility: Secondary | ICD-10-CM | POA: Diagnosis not present

## 2017-10-28 DIAGNOSIS — M5386 Other specified dorsopathies, lumbar region: Secondary | ICD-10-CM

## 2017-10-28 DIAGNOSIS — G8929 Other chronic pain: Secondary | ICD-10-CM | POA: Diagnosis not present

## 2017-10-28 DIAGNOSIS — M545 Low back pain: Secondary | ICD-10-CM | POA: Diagnosis not present

## 2017-10-28 NOTE — Therapy (Signed)
[place] MAIN [facility] SERVICES [address] [place], [place], [address] Phone: [phone]   Fax:  [phone]  Physical Therapy Treatment  Patient Details  Name: Adam Wise MRN: [id number] Date of Birth: 1951-03-21 Referring Provider: [name], NP   Encounter Date: 10/28/2017  PT End of Session - 10/28/17 1519    Visit Number  13    Number of Visits  16    Date for PT Re-Evaluation  11/11/17    Authorization - Visit Number  3    Authorization - Number of Visits  10    PT Start Time  6962    PT Stop Time  1600    PT Time Calculation (min)  45 min    Equipment Utilized During Treatment  Gait belt    Activity Tolerance  Patient tolerated treatment well    Behavior During Therapy  [facility] for tasks assessed/performed       Past Medical History:  Diagnosis Date  . Chickenpox   . Chronic knee pain   . Chronic low back pain   . Essential hypertension   . GERD (gastroesophageal reflux disease)   . OSA (obstructive sleep apnea)   . Testosterone deficiency   . Type 2 diabetes mellitus ([place])     Past Surgical History:  Procedure Laterality Date  . REPLACEMENT TOTAL KNEE BILATERAL  2015  . TONSILLECTOMY  1960    There were no vitals filed for this visit.  Subjective Assessment - 10/28/17 1518    Subjective  Patient reports that he is tight feeling still, but has been busy buying a new house.     Patient is accompained by:  Family member    Pertinent History  Patient is a pleasant 66 year old male who presents to physical therapy for low back pain. Had PT 6 months ago for his back in [place]. Reports having sciatica two years ago in 2016, went away with acupuncture. Right IT band has been tight ever since 2016. Pain occurs across upper lumbar and will spread to between shoulder blades with occasional tingling in fingers when sleeping on back at night. Pt. Has a history of two knee replacements and was a [employer] cabin crew. He and his wife recently moved to [place] and a currently living with their daughter while searching for a home.     Limitations  Sitting;Lifting;Standing;Walking;House hold activities    How long can you sit comfortably?  getting up from sitting is bothersome    How long can you stand comfortably?  half hour    How long can you walk comfortably?  15 minutes    Diagnostic tests  X ray    Patient Stated Goals  be able to pick things back up    Currently in Pain?  Yes    Pain Score  1     Pain Location  Back    Pain Orientation  Lower    Pain Descriptors / Indicators  Aching    Pain Type  Chronic pain    Pain Onset  In the past 7 days        Nustep 3 minutes level5  Manual PROM 60-90 second holds hamstring, popliteal angle, dorsiflexion overpressure, IT band, Ab, Ad,  Distraction with belt, SAD, lateral and inferior multiple angles 5x20 second holds  Mobilizations to thoracolumbar spine grade I-IV STM to IT band Prone hip flexor stretch2x 60 seconds  TherEx 90 90 neural glides 5x5.  TrA activation  swiss ball 10x 5 second Trunk flexion with swiss ball 10x , sidebend 6x  Pelvic tilts 15x GTB rows 10x   Pt. response to medical necessity: Patient will continue to benefit from skilled physical therapy to decrease pain and improve quality of life                         PT Education - 10/28/17 1519    Education provided  Yes    Education Details  stretching and body mechanics for mobility     Person(s) Educated  Patient    Methods  Explanation;Demonstration;Verbal cues    Comprehension  Verbalized understanding;Returned demonstration       PT Short Term Goals - 10/07/17 1531      PT SHORT TERM GOAL #1   Title  Patient will be independent in home exercise program to improve strength/mobility for better functional independence with ADLs.    Baseline  HEP given    Time  2    Period  Weeks    Status  Partially Met      PT  SHORT TERM GOAL #2   Title  Patient's average low back pain will decrease to 3/10 to improve quality of life.    Baseline  average/worst 1/10     Time  2    Period  Weeks    Status  Achieved        PT Long Term Goals - 10/19/17 1528      PT LONG TERM GOAL #1   Title  Patient will report a worst pain of 3/10 on VAS in lumbar region to improve tolerance with ADLs and reduced symptoms with activities    Baseline  10/3: 8/10; 11/5 worst 2/10    Time  8    Period  Weeks    Status  Achieved      PT LONG TERM GOAL #2   Title  Patient will reduce modified Oswestry score to <20 as to demonstrate minimal disability with ADLs including improved sleeping tolerance, walking/sitting tolerance etc for better mobility with ADLs.     Baseline  10/3: 22% 11/5: 12%    Time  8    Period  Weeks    Status  Achieved      PT LONG TERM GOAL #3   Title  Patient will decrease FABQ to 30/96 to demonstrate decreased disability and fear of movement fo rimproved quality of life.     Baseline  10/3: 42/96 11/5: 21/96    Time  8    Period  Weeks    Status  Achieved      PT LONG TERM GOAL #4   Title  Patient will be able to perform household work/ chores without increase in symptoms.    Baseline  10/3: pain with movement 11/5: increase to 2/10 symptoms    Time  8    Period  Weeks    Status  Partially Met            Plan - 10/28/17 1545    Clinical Impression Statement  Patient presents with tight posterior musculature from recent house hunting expedition. Manual tx released some tightness and relieved pain in posterior aspect of back. Patient will continue to benefit from skilled physical therapy to decrease pain and improve quality of life    Rehab Potential  Good    Clinical Impairments Affecting Rehab Potential  (-) HTN, reflux, bilateral TKA, diabetes, history of chronic back pain,recent  move (+) good family support and understanding of medical field    PT Frequency  2x / week    PT Duration  8  weeks    PT Treatment/Interventions  ADLs/Self Care Home Management;Aquatic Therapy;Cryotherapy;Electrical Stimulation;Iontophoresis '4mg'$ /ml Dexamethasone;Moist Heat;Traction;Ultrasound;DME Instruction;Balance training;Therapeutic exercise;Therapeutic activities;Functional mobility training;Stair training;Gait training;Neuromuscular re-education;Patient/family education;Manual techniques;Passive range of motion;Dry needling;Energy conservation    PT Next Visit Plan  Nustep and leg press    PT Home Exercise Plan  see sheet    Consulted and Agree with Plan of Care  Patient;Family member/caregiver    Family Member Consulted  wife       Patient will benefit from skilled therapeutic intervention in order to improve the following deficits and impairments:  Abnormal gait, Decreased activity tolerance, Decreased balance, Decreased endurance, Decreased knowledge of use of DME, Decreased mobility, Decreased range of motion, Difficulty walking, Decreased strength, Hypomobility, Impaired flexibility, Impaired perceived functional ability, Increased muscle spasms, Postural dysfunction, Pain, Increased fascial restricitons  Visit Diagnosis: Chronic bilateral low back pain without sciatica  Decreased range of motion of lumbar spine  Other abnormalities of gait and mobility     Problem List Patient Active Problem List   Diagnosis Date Noted  . Essential hypertension 09/09/2017  . Type 2 diabetes mellitus ([place]) 09/09/2017  . GERD (gastroesophageal reflux disease) 09/09/2017  . Testosterone deficiency 09/09/2017  . OSA (obstructive sleep apnea) 09/09/2017  . Chronic bilateral low back pain without sciatica 09/09/2017  . H/O knee surgery 04/27/2014  . 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 [place], [place], [address] Phone: [phone]   Fax:   [phone]  Name: Adam Wise MRN: [id number] Date of Birth: 04-02-1951

## 2017-11-02 ENCOUNTER — Ambulatory Visit: Payer: Medicare Other

## 2017-11-02 DIAGNOSIS — R2689 Other abnormalities of gait and mobility: Secondary | ICD-10-CM

## 2017-11-02 DIAGNOSIS — M5386 Other specified dorsopathies, lumbar region: Secondary | ICD-10-CM

## 2017-11-02 DIAGNOSIS — M545 Low back pain: Principal | ICD-10-CM

## 2017-11-02 DIAGNOSIS — G8929 Other chronic pain: Secondary | ICD-10-CM

## 2017-11-02 NOTE — Therapy (Signed)
[place] MAIN [facility] SERVICES [address] [place], [place], [address] Phone: [phone]   Fax:  [phone]  Physical Therapy Treatment  Patient Details  Name: Adam Wise MRN: [id number] Date of Birth: 05/02/1951 Referring Provider: [name], NP   Encounter Date: 11/02/2017  PT End of Session - 11/02/17 1746    Visit Number  14    Number of Visits  16    Date for PT Re-Evaluation  11/11/17    Authorization - Visit Number  4    Authorization - Number of Visits  10    PT Start Time  8366    PT Stop Time  1830    PT Time Calculation (min)  48 min    Equipment Utilized During Treatment  Gait belt    Activity Tolerance  Patient tolerated treatment well    Behavior During Therapy  [facility] for tasks assessed/performed       Past Medical History:  Diagnosis Date  . Chickenpox   . Chronic knee pain   . Chronic low back pain   . Essential hypertension   . GERD (gastroesophageal reflux disease)   . OSA (obstructive sleep apnea)   . Testosterone deficiency   . Type 2 diabetes mellitus ([place])     Past Surgical History:  Procedure Laterality Date  . REPLACEMENT TOTAL KNEE BILATERAL  2015  . TONSILLECTOMY  1960    There were no vitals filed for this visit.   Nustep Lvl 3 4 minutes  Subjective Assessment - 11/02/17 1743    Subjective  Patient reports having a challenging weekend due to house buying problems. Feeling increased stiffness due to stress of weekend.     Patient is accompained by:  Family member    Pertinent History  Patient is a pleasant 66 year old male who presents to physical therapy for low back pain. Had PT 6 months ago for his back in [place]. Reports having sciatica two years ago in 2016, went away with acupuncture. Right IT band has been tight ever since 2016. Pain occurs across upper lumbar and will spread to between shoulder blades with occasional tingling in fingers when sleeping on back at night. Pt.  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     Limitations  Sitting;Lifting;Standing;Walking;House hold activities    How long can you sit comfortably?  getting up from sitting is bothersome    How long can you stand comfortably?  half hour    How long can you walk comfortably?  15 minutes    Diagnostic tests  X ray    Patient Stated Goals  be able to pick things back up    Currently in Pain?  Yes    Pain Score  1     Pain Location  Back    Pain Orientation  Lower    Pain Descriptors / Indicators  Aching    Pain Type  Chronic pain    Pain Onset  In the past 7 days    Pain Frequency  Intermittent     Manual Grade I-IV mobilizations of thoracolumbar spine 4x10 seconds each level, good movement of lumbar, hypomobile thoracic Prone hip flexor stretch 2x 30 seconds  TherEx  Quantum leg press #195 2x12 Quantum leg press single leg 2x12  Knee extension machine #4 plate 10x  bilat Knee extension machine two legs up one leg down (eccentric) 2x10 each leg Hamstring curl #3 plate 10x bilat Hamstring curl #2 plate two down one up 2x10 each leg  TrA contraction 15x with swiss ball  TrA contraction dead ball modified  Pelvic tilts 15x                           PT Education - 11/02/17 1745    Education provided  Yes    Education Details  IT band stretching for relief of pain    Person(s) Educated  Patient    Methods  Explanation;Demonstration    Comprehension  Verbalized understanding;Returned demonstration       PT Short Term Goals - 10/07/17 1531      PT SHORT TERM GOAL #1   Title  Patient will be independent in home exercise program to improve strength/mobility for better functional independence with ADLs.    Baseline  HEP given    Time  2    Period  Weeks    Status  Partially Met      PT SHORT TERM GOAL #2    Title  Patient's average low back pain will decrease to 3/10 to improve quality of life.    Baseline  average/worst 1/10     Time  2    Period  Weeks    Status  Achieved        PT Long Term Goals - 10/19/17 1528      PT LONG TERM GOAL #1   Title  Patient will report a worst pain of 3/10 on VAS in lumbar region to improve tolerance with ADLs and reduced symptoms with activities    Baseline  10/3: 8/10; 11/5 worst 2/10    Time  8    Period  Weeks    Status  Achieved      PT LONG TERM GOAL #2   Title  Patient will reduce modified Oswestry score to <20 as to demonstrate minimal disability with ADLs including improved sleeping tolerance, walking/sitting tolerance etc for better mobility with ADLs.     Baseline  10/3: 22% 11/5: 12%    Time  8    Period  Weeks    Status  Achieved      PT LONG TERM GOAL #3   Title  Patient will decrease FABQ to 30/96 to demonstrate decreased disability and fear of movement fo rimproved quality of life.     Baseline  10/3: 42/96 11/5: 21/96    Time  8    Period  Weeks    Status  Achieved      PT LONG TERM GOAL #4   Title  Patient will be able to perform household work/ chores without increase in symptoms.    Baseline  10/3: pain with movement 11/5: increase to 2/10 symptoms    Time  8    Period  Weeks    Status  Partially Met            Plan - 11/02/17 1802    Clinical Impression Statement  Patient performed strengthening activities of BLE with good form and minimal correction of body mechanics and positioning. No increase of symptoms of back created. Low back pain decreased after manual tx. Patient will continue to benefit from skilled physical therapy to decrease pain and improve quality of life.     Rehab Potential  Good    Clinical Impairments Affecting Rehab Potential  (-)  HTN, reflux, bilateral TKA, diabetes, history of chronic back pain,recent move (+) good family support and understanding of medical field    PT Frequency  2x / week     PT Duration  8 weeks    PT Treatment/Interventions  ADLs/Self Care Home Management;Aquatic Therapy;Cryotherapy;Electrical Stimulation;Iontophoresis '4mg'$ /ml Dexamethasone;Moist Heat;Traction;Ultrasound;DME Instruction;Balance training;Therapeutic exercise;Therapeutic activities;Functional mobility training;Stair training;Gait training;Neuromuscular re-education;Patient/family education;Manual techniques;Passive range of motion;Dry needling;Energy conservation    PT Next Visit Plan  Nustep and leg press    PT Home Exercise Plan  see sheet    Consulted and Agree with Plan of Care  Patient;Family member/caregiver    Family Member Consulted  wife       Patient will benefit from skilled therapeutic intervention in order to improve the following deficits and impairments:  Abnormal gait, Decreased activity tolerance, Decreased balance, Decreased endurance, Decreased knowledge of use of DME, Decreased mobility, Decreased range of motion, Difficulty walking, Decreased strength, Hypomobility, Impaired flexibility, Impaired perceived functional ability, Increased muscle spasms, Postural dysfunction, Pain, Increased fascial restricitons  Visit Diagnosis: Chronic bilateral low back pain without sciatica  Decreased range of motion of lumbar spine  Other abnormalities of gait and mobility     Problem List Patient Active Problem List   Diagnosis Date Noted  . Essential hypertension 09/09/2017  . Type 2 diabetes mellitus ([place]) 09/09/2017  . GERD (gastroesophageal reflux disease) 09/09/2017  . Testosterone deficiency 09/09/2017  . OSA (obstructive sleep apnea) 09/09/2017  . Chronic bilateral low back pain without sciatica 09/09/2017  . H/O knee surgery 04/27/2014  . 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 [place], [place], [address] Phone:  [phone]   Fax:  [phone]  Name: Kristofer Schaffert MRN: [id number] Date of Birth: [birth year]

## 2017-11-03 ENCOUNTER — Encounter: Payer: Self-pay | Admitting: Primary Care

## 2017-11-07 ENCOUNTER — Encounter: Payer: Self-pay | Admitting: Primary Care

## 2017-11-07 DIAGNOSIS — E119 Type 2 diabetes mellitus without complications: Secondary | ICD-10-CM

## 2017-11-09 ENCOUNTER — Encounter: Payer: Self-pay | Admitting: Primary Care

## 2017-11-09 ENCOUNTER — Ambulatory Visit: Payer: Medicare Other

## 2017-11-09 DIAGNOSIS — R2689 Other abnormalities of gait and mobility: Secondary | ICD-10-CM | POA: Diagnosis not present

## 2017-11-09 DIAGNOSIS — M545 Low back pain: Secondary | ICD-10-CM | POA: Diagnosis not present

## 2017-11-09 DIAGNOSIS — M5386 Other specified dorsopathies, lumbar region: Secondary | ICD-10-CM | POA: Diagnosis not present

## 2017-11-09 DIAGNOSIS — G8929 Other chronic pain: Secondary | ICD-10-CM | POA: Diagnosis not present

## 2017-11-09 NOTE — Therapy (Signed)
[place] MAIN [facility] SERVICES [address] [place], [place], [address] Phone: [phone]   Fax:  [phone]  Physical Therapy Treatment  Patient Details  Name: Adam Wise MRN: [id number] Date of Birth: 1951/09/04 Referring Provider: [name], NP   Encounter Date: 11/09/2017  PT End of Session - 11/09/17 1309    Visit Number  15    Number of Visits  16    Date for PT Re-Evaluation  11/11/17    Authorization - Visit Number  5    Authorization - Number of Visits  10    PT Start Time  8099    PT Stop Time  1345    PT Time Calculation (min)  43 min    Equipment Utilized During Treatment  Gait belt    Activity Tolerance  Patient tolerated treatment well    Behavior During Therapy  [facility] for tasks assessed/performed       Past Medical History:  Diagnosis Date  . Chickenpox   . Chronic knee pain   . Chronic low back pain   . Essential hypertension   . GERD (gastroesophageal reflux disease)   . OSA (obstructive sleep apnea)   . Testosterone deficiency   . Type 2 diabetes mellitus ([place])     Past Surgical History:  Procedure Laterality Date  . REPLACEMENT TOTAL KNEE BILATERAL  2015  . TONSILLECTOMY  1960    There were no vitals filed for this visit.  Subjective Assessment - 11/09/17 1307    Subjective  Patient reports low back is feeling good.  Worst pain in low back 0/10.  Mid back and upper back now bothering him.     Patient is accompained by:  Family member    Pertinent History  Patient is a pleasant 66 year old male who presents to physical therapy for low back pain. Had PT 6 months ago for his back in [place]. Reports having sciatica two years ago in 2016, went away with acupuncture. Right IT band has been tight ever since 2016. Pain occurs across upper lumbar and will spread to between shoulder blades with occasional tingling in fingers when sleeping on back at night. Pt. Has a history of two knee replacements  and was a Public librarian. He and his wife recently moved to [place] and a currently living with their daughter while searching for a home.     Limitations  Sitting;Lifting;Standing;Walking;House hold activities    How long can you sit comfortably?  getting up from sitting is bothersome    How long can you stand comfortably?  half hour    How long can you walk comfortably?  15 minutes    Diagnostic tests  X ray    Patient Stated Goals  be able to pick things back up    Currently in Pain?  Yes    Pain Score  2     Pain Location  Back    Pain Orientation  Mid    Pain Descriptors / Indicators  Aching    Pain Type  Chronic pain    Pain Onset  --       Manual Grade I-IV mobilizations of thoracolumbar spine 4x10 seconds each level, good movement of lumbar, hypomobile thoracic Prone hip flexor stretch 2x 30 seconds   TherEx    Knee extension machine #5 plate 2x 15 bilat Knee extension machine two legs up one leg down (eccentric) 2x10 each leg Hamstring curl #5 plate 2x15 bilat  Hamstring curl #2 plate two down one up 2x10 each leg  TrA contraction 15x with swiss ball  TrA contraction dead swiss ball modified 12x Pelvic tilts 15x  TrA contraction marches 15x      taking a break until referral for mid-upper back                  PT Education - 11/09/17 1309    Education provided  Yes    Education Details  taking a break until referral for mid-upper back    Person(s) Educated  Patient    Methods  Explanation;Tactile cues    Comprehension  Verbalized understanding;Returned demonstration       PT Short Term Goals - 11/09/17 1336      PT SHORT TERM GOAL #1   Title  Patient will be independent in home exercise program to improve strength/mobility for better functional independence with ADLs.    Baseline  HEP given    Time  2    Period  Weeks    Status  Achieved      PT SHORT TERM GOAL #2   Title  Patient's average low back pain will decrease to  3/10 to improve quality of life.    Baseline  worst 0/10 in the past week    Time  2    Period  Weeks    Status  Achieved        PT Long Term Goals - 11/09/17 1337      PT LONG TERM GOAL #1   Title  Patient will report a worst pain of 3/10 on VAS in lumbar region to improve tolerance with ADLs and reduced symptoms with activities    Baseline  10/3: 8/10; 11/5 worst 2/10    Time  8    Period  Weeks    Status  Achieved      PT LONG TERM GOAL #2   Title  Patient will reduce modified Oswestry score to <20 as to demonstrate minimal disability with ADLs including improved sleeping tolerance, walking/sitting tolerance etc for better mobility with ADLs.     Baseline  10/3: 22% 11/5: 12%    Time  8    Period  Weeks    Status  Achieved      PT LONG TERM GOAL #3   Title  Patient will decrease FABQ to 30/96 to demonstrate decreased disability and fear of movement fo rimproved quality of life.     Baseline  10/3: 42/96 11/5: 21/96    Time  8    Period  Weeks    Status  Achieved      PT LONG TERM GOAL #4   Title  Patient will be able to perform household work/ chores without increase in symptoms.    Baseline  0/10 pain with movement    Time  8    Period  Weeks    Status  Achieved            Plan - 11/09/17 1340    Clinical Impression Statement  Patient has no pain in low back at this time and during the week prior.  Patient will benefit from break from therapy for home based program at this time until referral for upper/mid back sent. Low back pain resolved, goals met. I will be happy to see this patient for mid and upper back pain.     Rehab Potential  Good    Clinical Impairments Affecting Rehab Potential  (-) HTN, reflux,  bilateral TKA, diabetes, history of chronic back pain,recent move (+) good family support and understanding of medical field    PT Frequency  2x / week    PT Duration  8 weeks    PT Treatment/Interventions  ADLs/Self Care Home Management;Aquatic  Therapy;Cryotherapy;Electrical Stimulation;Iontophoresis 53m/ml Dexamethasone;Moist Heat;Traction;Ultrasound;DME Instruction;Balance training;Therapeutic exercise;Therapeutic activities;Functional mobility training;Stair training;Gait training;Neuromuscular re-education;Patient/family education;Manual techniques;Passive range of motion;Dry needling;Energy conservation    PT Next Visit Plan  wait for referral for mid and upper back    PT Home Exercise Plan  see sheet    Consulted and Agree with Plan of Care  Patient;Family member/caregiver    Family Member Consulted  wife       Patient will benefit from skilled therapeutic intervention in order to improve the following deficits and impairments:  Abnormal gait, Decreased activity tolerance, Decreased balance, Decreased endurance, Decreased knowledge of use of DME, Decreased mobility, Decreased range of motion, Difficulty walking, Decreased strength, Hypomobility, Impaired flexibility, Impaired perceived functional ability, Increased muscle spasms, Postural dysfunction, Pain, Increased fascial restricitons  Visit Diagnosis: Chronic bilateral low back pain without sciatica  Decreased range of motion of lumbar spine  Other abnormalities of gait and mobility     Problem List Patient Active Problem List   Diagnosis Date Noted  . Essential hypertension 09/09/2017  . Type 2 diabetes mellitus ([place] 09/09/2017  . GERD (gastroesophageal reflux disease) 09/09/2017  . Testosterone deficiency 09/09/2017  . OSA (obstructive sleep apnea) 09/09/2017  . Chronic bilateral low back pain without sciatica 09/09/2017  . H/O knee surgery 04/27/2014  . 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

## 2017-11-10 NOTE — Telephone Encounter (Signed)
Will you call his PT group to see what's needed? Do I need to put in another referral? I haven't received any requests for PT orders. Thanks.

## 2017-11-10 NOTE — Telephone Encounter (Signed)
Medications have not been prescribed by Anda Kraft.

## 2017-11-11 ENCOUNTER — Ambulatory Visit: Payer: Medicare Other

## 2017-11-11 ENCOUNTER — Telehealth: Payer: Self-pay | Admitting: Primary Care

## 2017-11-11 DIAGNOSIS — R2689 Other abnormalities of gait and mobility: Secondary | ICD-10-CM | POA: Diagnosis not present

## 2017-11-11 DIAGNOSIS — M5386 Other specified dorsopathies, lumbar region: Secondary | ICD-10-CM | POA: Diagnosis not present

## 2017-11-11 DIAGNOSIS — M545 Low back pain: Principal | ICD-10-CM

## 2017-11-11 DIAGNOSIS — G8929 Other chronic pain: Secondary | ICD-10-CM

## 2017-11-11 DIAGNOSIS — M546 Pain in thoracic spine: Secondary | ICD-10-CM

## 2017-11-11 MED ORDER — GLIPIZIDE 10 MG PO TABS: 10.0000 mg | ORAL_TABLET | Freq: Two times a day (BID) | ORAL | 1 refills | Status: DC

## 2017-11-11 MED ORDER — SYRINGE DISPOSABLE 3 ML MISC: 2 refills | Status: DC

## 2017-11-11 MED ORDER — METFORMIN HCL 1000 MG PO TABS: 1000.0000 mg | ORAL_TABLET | Freq: Two times a day (BID) | ORAL | 1 refills | Status: DC

## 2017-11-11 NOTE — Telephone Encounter (Signed)
-----   Message from Ian Bushman sent at 11/11/2017  7:55 AM EST ----- Regarding: PT referral Good morning. Can you please put an order/referral in for PT for dx thoracic pain. Patient's lower back is doing better but is now complaining of some upper back pain. The patient is scheduled to come in this pm, if you can put this in before he shows up that would be greatly appreciated.   Thanks!

## 2017-11-11 NOTE — Therapy (Signed)
[place] MAIN [facility] SERVICES [address] [place], [place], [address] Phone: [phone]   Fax:  [phone]  Physical Therapy Evaluation  Patient Details  Name: Adam Wise MRN: [id number] Date of Birth: 1951/09/26 Referring Provider: Alma Friendly   Encounter Date: 11/11/2017  PT End of Session - 11/11/17 1703    Visit Number  1    Number of Visits  16    Date for PT Re-Evaluation  01/06/18    Authorization - Visit Number  1    Authorization - Number of Visits  10    PT Start Time  1600    PT Stop Time  3818    PT Time Calculation (min)  53 min    Activity Tolerance  Patient tolerated treatment well;Patient limited by pain    Behavior During Therapy  [facility] for tasks assessed/performed       Past Medical History:  Diagnosis Date  . Chickenpox   . Chronic knee pain   . Chronic low back pain   . Essential hypertension   . GERD (gastroesophageal reflux disease)   . OSA (obstructive sleep apnea)   . Testosterone deficiency   . Type 2 diabetes mellitus ([place])     Past Surgical History:  Procedure Laterality Date  . REPLACEMENT TOTAL KNEE BILATERAL  2015  . TONSILLECTOMY  1960    There were no vitals filed for this visit.   Subjective Assessment - 11/11/17 1605    Subjective  patient is a pleasant 66 y.o male who has been seen by this therapist for low back pain and now is referred for thoracic pain.     Pertinent History  Patient is a pleasant 66 year old male who presents to physical therapy for mid to upper back pain. Had PT 6 months ago for his back in [place]. Reports having sciatica two years ago in 2016, went away with acupuncture. Right IT band has been tight ever since 2016. Pain occurs across upper lumbar and will spread to between shoulder blades with occasional tingling in fingers when sleeping on back at night. Pt. 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     Limitations  Lifting;Standing;House hold activities;Other (comment)    How long can you sit comfortably?  doesn't affect    How long can you stand comfortably?  half hour    Diagnostic tests  x ray     Patient Stated Goals  get rid of pain    Currently in Pain?  Yes    Pain Score  4     Pain Location  Back    Pain Orientation  Mid    Pain Descriptors / Indicators  Sharp;Stabbing    Pain Type  Chronic pain    Pain Radiating Towards  sometimes gets tingling in fingers.     Pain Onset  More than a month ago    Pain Frequency  Constant         [facility] PT Assessment - 11/11/17 0001      Assessment   Medical Diagnosis  thoracic back pain    Referring Provider  Alma Friendly    Onset Date/Surgical Date  -- 15 + years, pt. unsure of start day    Hand Dominance  Left    Next MD Visit  patient not sure  Prior Therapy  yes      Precautions   Precautions  Knee    Precaution Comments  no running, no high impact on knees      Restrictions   Weight Bearing Restrictions  No      Balance Screen   Has the patient fallen in the past 6 months  No    Has the patient had a decrease in activity level because of a fear of falling?   Yes    Is the patient reluctant to leave their home because of a fear of falling?   No      Home Film/video editor residence    Living Arrangements  Spouse/significant other    Available Help at Discharge  Family    Type of [place] to enter    Entrance Stairs-Number of Steps  3    Entrance Stairs-Rails  Cannot reach both;Right;Left    Home Layout  One level    [place]  None      Prior Function   Level of [place]  Retired used to be Social research officer, government    Leisure  work in Ship [employer]   Overall Cognitive Status  Within Functional Limits for tasks assessed       Observation/Other Assessments   Observations  excessive thoracic kyphosis    Focus on Therapeutic Outcomes (FOTO)   NDI, MODI, FABQ      Sensation   Light Touch  Appears Intact      Coordination   Gross Motor Movements are Fluid and Coordinated  Yes      Posture/Postural Control   Posture/Postural Control  Postural limitations    Postural Limitations  Rounded Shoulders;Forward head;Increased thoracic kyphosis      Palpation   Spinal mobility  thoracic hypomobile, tenderness at T10-11 CPA and T3-4, 4-5 with L UPA decreasing pain       Transfers   Transfers  Independent with all Transfers      Ambulation/Gait   Ambulation/Gait  Yes    Ambulation/Gait Assistance  7: Independent    Assistive device  None    Gait Pattern  Decreased arm swing - right;Decreased arm swing - left;Decreased trunk rotation        PAIN: Current pain: 4/10 Worst pain: 6/10 Best pain: 4/10  POSTURE: Excessive thoracic kyphosis, lumbar lordosis, forward head rounded shoulders    PROM/AROM:    Right Left  Flexion 30  Extension 39  Side Bending 25 25  Rotation 50 60   Trunk Flexion Seated thoracic: 30 lumbar 21  Trunk Extension   Trunk R SB Limited, tight on left but not shooting pain  Trunk L SB painful  Trunk R rotation Limited non pain  Trunk L rotation Limited non painful    T10-11 CPA painful and hypomobile  T3-4, 4-5 CPA painful and hypombile , L UPA decrease pain  Tight paraspinals   STRENGTH:  Graded on a 0-5 scale Muscle Group Left Right  Shoulder flex 4+/5 Painful into thoracic region   Shoulder Abd 4/5 Painful into thoracic region   Shoulder Ext 4/5 3+/5  Shoulder IR/ER 4-/5 3+/5  Elbow 4/5 4/5  Wrist/hand 4/5 4/5   SENSATION: WFL  SPECIAL TESTS: ULTT - on L and R for medial and radial nerve Slump test -     FUNCTIONAL MOBILITY:  Decreased rotation of thoracic  region with increased pain, decreased thoracic rotation with ambulation, excessive kyphosis in  seated posture.    OUTCOME MEASURES: TEST Outcome Interpretation  NDI  14% Mild disability   MODI 36% Moderate disability  FABQPA 14% Fearful of physical activity                    Treat Scapular retraction 10x Elastic band straight arm scapular retraction /shoulder extension 10x Trunk extension with towel seated mobilizations 10x Doorway pec stretch     Objective measurements completed on examination: See above findings.                PT Education - 11/11/17 1702    Education provided  Yes    Education Details  POC, HEP    Person(s) Educated  Patient    Methods  Explanation;Demonstration;Verbal cues;Handout    Comprehension  Verbalized understanding;Returned demonstration       PT Short Term Goals - 11/11/17 1714      PT SHORT TERM GOAL #1   Title  Patient will be independent in home exercise program to improve strength/mobility for better functional independence with ADLs.    Baseline  HEP given    Time  2    Period  Weeks    Status  New    Target Date  11/25/17      PT SHORT TERM GOAL #2   Title  Patient will report a worst pain of 4/10 on VAS in  thoracic back   to improve tolerance with ADLs and reduced symptoms with activities.     Baseline  worst 6/10    Time  2    Period  Weeks    Status  New    Target Date  11/25/17        PT Long Term Goals - 11/11/17 1716      PT LONG TERM GOAL #1   Title  Patient will report a worst pain of 1/10 on VAS in thoracic back  to improve tolerance with ADLs and reduced symptoms with activities.     Baseline  11/28: 6/10    Time  8    Period  Weeks    Status  New    Target Date  01/06/18      PT LONG TERM GOAL #2   Title  Patient will reduce modified Oswestry score to <20 as to demonstrate minimal disability with ADLs including improved sleeping tolerance, walking/sitting tolerance etc for better mobility with ADLs.     Baseline  11/28: 36%    Time  8    Period  Weeks    Status  New    Target  Date  01/06/18      PT LONG TERM GOAL #3   Title  Patient will decrease FABQPA to <13 to demonstrate decreased disability and fear of movement fo rimproved quality of life.     Baseline  11/28: 14    Time  8    Period  Weeks    Status  New    Target Date  01/06/18      PT LONG TERM GOAL #4   Title  Patient will tolerate sitting unsupported demonstrating erect sitting posture with minimal thoracic kyphosis for 15+ minutes with maximum of 5/10 back pain to demonstrate improved back extensor strength and improved sitting tolerance.     Baseline  excessive thoracic kyphosis    Time  8    Period  Weeks    Status  New    Target Date  01/06/18             Plan - 11/22/17 1712    Clinical Impression Statement  Patient is a pleasant 66y.o male who presents to physical therapy for thoracic pain. Pt. Was being seen by this therapist for lumbar pain which is now resolved. Pt. Is limited in rotation and side bending, Side bending to L causes severe pain in R thoracic region. Mobilizations to thoracic region hypomobile with  T10-11 CPA painful and hypomobile as well as T3-4, 4-5 CPA painful and hypombile , L UPA decreased pain. Excessive thoracic kyphosis noted with weak postural muscles and tight pectoral musculature. Paraspinals of thoracic region guarding due to pain. FABQPA=14 meaning fear of movement, MODI-36% which is moderate perceived disability, and NDI=14% which indicates mild disability. Patient would benefit from skilled physical therapy to decrease pain, improve posture trunk mobility to increase patient quality of life.     History and Personal Factors relevant to plan of care:  This patient presents with  3, personal factors/ comorbidities  and 3  body elements including body structures and functions, activity limitations and or participation restrictions. Patient's condition is stable.    Clinical Presentation  Stable    Clinical Presentation due to:  chronicity of pain    Clinical  Decision Making  Low    Rehab Potential  Good    Clinical Impairments Affecting Rehab Potential  (-) HTN, reflux, bilateral TKA, diabetes, history of chronic back pain,recent move (+) good family support and understanding of medical field    PT Frequency  2x / week    PT Duration  8 weeks    PT Treatment/Interventions  ADLs/Self Care Home Management;Aquatic Therapy;Cryotherapy;Electrical Stimulation;Iontophoresis 4mg /ml Dexamethasone;Moist Heat;Traction;Ultrasound;Balance training;Therapeutic exercise;Therapeutic activities;Functional mobility training;Stair training;Gait training;Neuromuscular re-education;Patient/family education;Manual techniques;Passive range of motion;Dry needling;Energy conservation;Taping    PT Next Visit Plan  review HEP, mobilizations of thoracic region     PT Home Exercise Plan  see sheet    Consulted and Agree with Plan of Care  Patient       Patient will benefit from skilled therapeutic intervention in order to improve the following deficits and impairments:  Abnormal gait, Decreased activity tolerance, Decreased balance, Decreased endurance, Decreased mobility, Decreased range of motion, Difficulty walking, Decreased strength, Hypomobility, Impaired flexibility, Impaired perceived functional ability, Increased muscle spasms, Postural dysfunction, Pain, Increased fascial restricitons, Improper body mechanics  Visit Diagnosis: Chronic bilateral low back pain without sciatica  Decreased range of motion of lumbar spine  Other abnormalities of gait and mobility  G-Codes - 2017-11-22 1720    Functional Assessment Tool Used (Outpatient Only)  VAS, MODI, FABQ, clinical judgement, NDI, MMT, ROM    Functional Limitation  Changing and maintaining body position    Changing and Maintaining Body Position Current Status (U2353)  At least 20 percent but less than 40 percent impaired, limited or restricted    Changing and Maintaining Body Position Goal Status (I1443)  At least 1  percent but less than 20 percent impaired, limited or restricted        Problem List Patient Active Problem List   Diagnosis Date Noted  . Essential hypertension 09/09/2017  . Type 2 diabetes mellitus ([place]) 09/09/2017  . GERD (gastroesophageal reflux disease) 09/09/2017  . Testosterone deficiency 09/09/2017  . OSA (obstructive sleep apnea) 09/09/2017  . Chronic bilateral low back pain without sciatica 09/09/2017  . H/O knee surgery 04/27/2014  . 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

## 2017-11-11 NOTE — Telephone Encounter (Signed)
Referral placed.

## 2017-11-11 NOTE — Telephone Encounter (Signed)
Please notify patient that he's due for repeat A1C after December 26th, lab only appointment. Also needs office visit for follow up after March 26th. Please schedule both.

## 2017-11-13 NOTE — Telephone Encounter (Signed)
Message left for patient to return my call on 11/12/2017 and 11/13/2017

## 2017-11-14 LAB — HM DIABETES EYE EXAM

## 2017-11-16 ENCOUNTER — Ambulatory Visit: Payer: Medicare Other | Attending: Primary Care

## 2017-11-16 DIAGNOSIS — R2689 Other abnormalities of gait and mobility: Secondary | ICD-10-CM | POA: Diagnosis not present

## 2017-11-16 DIAGNOSIS — M5386 Other specified dorsopathies, lumbar region: Secondary | ICD-10-CM | POA: Insufficient documentation

## 2017-11-16 DIAGNOSIS — G8929 Other chronic pain: Secondary | ICD-10-CM | POA: Insufficient documentation

## 2017-11-16 DIAGNOSIS — M545 Low back pain: Secondary | ICD-10-CM | POA: Diagnosis not present

## 2017-11-16 NOTE — Therapy (Signed)
Adam Wise [facility] SERVICES [address] [place], [place], [address] Phone: [phone]   Fax:  [phone]  Physical Therapy Treatment  Patient Details  Name: Adam Wise MRN: [id number] Date of Birth: 1951/09/18 Referring Provider: Alma Friendly   Encounter Date: 11/16/2017  PT End of Session - 11/16/17 1559    Visit Number  2    Number of Visits  16    Date for PT Re-Evaluation  01/06/18    Authorization - Visit Number  2    Authorization - Number of Visits  10    PT Start Time  1600    PT Stop Time  4034    PT Time Calculation (min)  45 min    Activity Tolerance  Patient tolerated treatment well;Patient limited by pain    Behavior During Therapy  [facility] for tasks assessed/performed       Past Medical History:  Diagnosis Date  . Chickenpox   . Chronic knee pain   . Chronic low back pain   . Essential hypertension   . GERD (gastroesophageal reflux disease)   . OSA (obstructive sleep apnea)   . Testosterone deficiency   . Type 2 diabetes mellitus ([place])     Past Surgical History:  Procedure Laterality Date  . REPLACEMENT TOTAL KNEE BILATERAL  2015  . TONSILLECTOMY  1960    There were no vitals filed for this visit.  Subjective Assessment - 11/16/17 1602    Subjective  Patient pain is better today, two days ago pt. was very painful (4/10) and felt it was going to work lower, HEP compliance     Pertinent History  Patient is a pleasant 66 year old male who presents to physical therapy for mid to upper back pain. Had PT 6 months ago for his back in [place]. Reports having sciatica two years ago in 2016, went away with acupuncture. Right IT band has been tight ever since 2016. Pain occurs across upper lumbar and will spread to between shoulder blades with occasional tingling in fingers when sleeping on back at night. Pt. Has a history of two knee replacements and was a Public librarian. He and his wife recently moved  to [place] and a currently living with their daughter while searching for a home.     Limitations  Lifting;Standing;House hold activities;Other (comment)    How long can you sit comfortably?  doesn't affect    How long can you stand comfortably?  half hour    Diagnostic tests  x ray     Patient Stated Goals  get rid of pain    Currently in Pain?  Yes    Pain Score  2     Pain Location  Back    Pain Orientation  Mid    Pain Descriptors / Indicators  Sharp;Stabbing    Pain Type  Chronic pain    Pain Onset  More than a month ago    Pain Frequency  Constant       Manual: Mobilizations grade I-III CPAs and UPAs   TherEx Open prayer book stretch supine 2x each direction 30 seconds each Robber stretch 60 seconds  On half foam roller supine  Robber stretch 60 esconds  RTB flys 15x  ER RTB 15x  Scapular retraction 10x Elastic band straight arm scapular retraction /shoulder extension 10x 2 sets Doorway pec stretch 3x 30 seconds each arm   towel AAROM extension and side bending for upper trap stretch 8x  15 seconds    Pt. response to medical necessity: Patient will continue to benefit form skilled physical therapy to decrease pain, improve trunk mobility to increase patient's quality of life.                    PT Education - 11/16/17 1603    Education provided  Yes    Education Details  body mechanics and functional posture    Person(s) Educated  Patient    Methods  Explanation;Demonstration;Verbal cues    Comprehension  Verbalized understanding;Returned demonstration       PT Short Term Goals - 11/11/17 1714      PT SHORT TERM GOAL #1   Title  Patient will be independent in home exercise program to improve strength/mobility for better functional independence with ADLs.    Baseline  HEP given    Time  2    Period  Weeks    Status  New    Target Date  11/25/17      PT SHORT TERM GOAL #2   Title  Patient will report a worst pain of 4/10 on VAS in   thoracic back   to improve tolerance with ADLs and reduced symptoms with activities.     Baseline  worst 6/10    Time  2    Period  Weeks    Status  New    Target Date  11/25/17        PT Long Term Goals - 11/11/17 1716      PT LONG TERM GOAL #1   Title  Patient will report a worst pain of 1/10 on VAS in thoracic back  to improve tolerance with ADLs and reduced symptoms with activities.     Baseline  11/28: 6/10    Time  8    Period  Weeks    Status  New    Target Date  01/06/18      PT LONG TERM GOAL #2   Title  Patient will reduce modified Oswestry score to <20 as to demonstrate minimal disability with ADLs including improved sleeping tolerance, walking/sitting tolerance etc for better mobility with ADLs.     Baseline  11/28: 36%    Time  8    Period  Weeks    Status  New    Target Date  01/06/18      PT LONG TERM GOAL #3   Title  Patient will decrease FABQPA to <13 to demonstrate decreased disability and fear of movement fo rimproved quality of life.     Baseline  11/28: 14    Time  8    Period  Weeks    Status  New    Target Date  01/06/18      PT LONG TERM GOAL #4   Title  Patient will tolerate sitting unsupported demonstrating erect sitting posture with minimal thoracic kyphosis for 15+ minutes with maximum of 5/10 back pain to demonstrate improved back extensor strength and improved sitting tolerance.     Baseline  excessive thoracic kyphosis    Time  8    Period  Weeks    Status  New    Target Date  01/06/18            Plan - 11/16/17 1632    Clinical Impression Statement  Patient educated on upright posture and function mobility. L shoulder mobility limits some postural strengthening interventions and requires modifications to decrease pain. Pt. Tolerated half foam roller for  postural correction. Patient will continue to benefit form skilled physical therapy to decrease pain, improve trunk mobility to increase patient's quality of life.     Rehab  Potential  Good    Clinical Impairments Affecting Rehab Potential  (-) HTN, reflux, bilateral TKA, diabetes, history of chronic back pain,recent move (+) good family support and understanding of medical field    PT Frequency  2x / week    PT Duration  8 weeks    PT Treatment/Interventions  ADLs/Self Care Home Management;Aquatic Therapy;Cryotherapy;Electrical Stimulation;Iontophoresis 4mg /ml Dexamethasone;Moist Heat;Traction;Ultrasound;Balance training;Therapeutic exercise;Therapeutic activities;Functional mobility training;Stair training;Gait training;Neuromuscular re-education;Patient/family education;Manual techniques;Passive range of motion;Dry needling;Energy conservation;Taping    PT Next Visit Plan  review HEP, mobilizations of thoracic region     PT Home Exercise Plan  see sheet    Consulted and Agree with Plan of Care  Patient       Patient will benefit from skilled therapeutic intervention in order to improve the following deficits and impairments:  Abnormal gait, Decreased activity tolerance, Decreased balance, Decreased endurance, Decreased mobility, Decreased range of motion, Difficulty walking, Decreased strength, Hypomobility, Impaired flexibility, Impaired perceived functional ability, Increased muscle spasms, Postural dysfunction, Pain, Increased fascial restricitons, Improper body mechanics  Visit Diagnosis: Chronic bilateral low back pain without sciatica  Decreased range of motion of lumbar spine  Other abnormalities of gait and mobility     Problem List Patient Active Problem List   Diagnosis Date Noted  . Essential hypertension 09/09/2017  . Type 2 diabetes mellitus ([place]) 09/09/2017  . GERD (gastroesophageal reflux disease) 09/09/2017  . Testosterone deficiency 09/09/2017  . OSA (obstructive sleep apnea) 09/09/2017  . Chronic bilateral low back pain without sciatica 09/09/2017  . H/O knee surgery 04/27/2014  . 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

## 2017-11-18 ENCOUNTER — Ambulatory Visit: Payer: Medicare Other

## 2017-11-18 DIAGNOSIS — G8929 Other chronic pain: Secondary | ICD-10-CM | POA: Diagnosis not present

## 2017-11-18 DIAGNOSIS — M545 Low back pain, unspecified: Secondary | ICD-10-CM

## 2017-11-18 DIAGNOSIS — R2689 Other abnormalities of gait and mobility: Secondary | ICD-10-CM

## 2017-11-18 DIAGNOSIS — M5386 Other specified dorsopathies, lumbar region: Secondary | ICD-10-CM | POA: Diagnosis not present

## 2017-11-18 NOTE — Therapy (Signed)
[place] [facility] SERVICES [address] [place], [place], [address] Phone: [phone]   Fax:  [phone]  Physical Therapy Treatment  Patient Details  Name: Adam Wise MRN: [id number] Date of Birth: [birth year] Referring Provider: Alma Friendly   Encounter Date: 11/18/2017  PT End of Session - 11/18/17 1628    Visit Number  3    Number of Visits  16    Date for PT Re-Evaluation  01/06/18    Authorization - Visit Number  3    Authorization - Number of Visits  10    PT Start Time  1600    PT Stop Time  5993    PT Time Calculation (min)  45 min    Activity Tolerance  Patient tolerated treatment well;Patient limited by pain    Behavior During Therapy  [facility] for tasks assessed/performed       Past Medical History:  Diagnosis Date  . Chickenpox   . Chronic knee pain   . Chronic low back pain   . Essential hypertension   . GERD (gastroesophageal reflux disease)   . OSA (obstructive sleep apnea)   . Testosterone deficiency   . Type 2 diabetes mellitus ([place])     Past Surgical History:  Procedure Laterality Date  . REPLACEMENT TOTAL KNEE BILATERAL  2015  . TONSILLECTOMY  1960    There were no vitals filed for this visit.  Subjective Assessment - 11/18/17 1601    Subjective  Patient's back and neck pain improved. Paying more attention to posture and noted L shoulder higher so corrected for it.     Pertinent History  Patient is a pleasant 66 year old male who presents to physical therapy for mid to upper back pain. Had PT 6 months ago for his back in [place]. Reports having sciatica two years ago in 2016, went away with acupuncture. Right IT band has been tight ever since 2016. Pain occurs across upper lumbar and will spread to between shoulder blades with occasional tingling in fingers when sleeping on back at night. 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     Limitations  Lifting;Standing;House hold activities;Other (comment)    How long can you sit comfortably?  doesn't affect    How long can you stand comfortably?  half hour    Diagnostic tests  x ray     Patient Stated Goals  get rid of pain    Currently in Pain?  Yes    Pain Score  1     Pain Location  Back    Pain Orientation  Mid    Pain Descriptors / Indicators  Tender;Stabbing    Pain Type  Chronic pain    Pain Onset  More than a month ago          Manual: Mobilizations grade I-IV CPAs and UPAs , decreased hypombility in lower thoracic region, continued hypomobility in upper thoracic    Suboccipital release : 2x 30 seconds Cervical SB with overpressure at shoulders supine stretch 2x 30, cervical rotation with overpressure at shoulders 2x 30 seconds   TherEx Open prayer book stretch supine 2x each direction 30 seconds each, overpressure applied at hip and wrist 2x 20 seconds  Robber stretch 60 seconds   On half foam roller supine  Robber stretch 2x60 esconds             RTB flys 15x             ER RTB 15x  Face pulls with cable machine 2.5#  12x  Single arm rows cable machine 2.5 # 12x, cues for keeping elbow in and shoulder down  Straight arm lat pull down cable machine 2.5# 12, cues for keeping  shoulders back  pec stretch doorway 2x 30 seconds    Pt. response to medical necessity: Patient will continue to benefit from skilled physical therapy to decrease pain, improve trunk mobility to increase patient's quality of life.                       PT Education - 11/18/17 1628    Education provided  Yes    Education Details  posture and thoracic mobility/body mechanics    Person(s) Educated  Patient    Methods  Explanation;Demonstration;Tactile cues    Comprehension  Verbalized understanding;Returned demonstration       PT Short Term Goals - 11/11/17  1714      PT SHORT TERM GOAL #1   Title  Patient will be independent in home exercise program to improve strength/mobility for better functional independence with ADLs.    Baseline  HEP given    Time  2    Period  Weeks    Status  New    Target Date  11/25/17      PT SHORT TERM GOAL #2   Title  Patient will report a worst pain of 4/10 on VAS in  thoracic back   to improve tolerance with ADLs and reduced symptoms with activities.     Baseline  worst 6/10    Time  2    Period  Weeks    Status  New    Target Date  11/25/17        PT Long Term Goals - 11/11/17 1716      PT LONG TERM GOAL #1   Title  Patient will report a worst pain of 1/10 on VAS in thoracic back  to improve tolerance with ADLs and reduced symptoms with activities.     Baseline  11/28: 6/10    Time  8    Period  Weeks    Status  New    Target Date  01/06/18      PT LONG TERM GOAL #2   Title  Patient will reduce modified Oswestry score to <20 as to demonstrate minimal disability with ADLs including improved sleeping tolerance, walking/sitting tolerance etc for better mobility with ADLs.     Baseline  11/28: 36%    Time  8    Period  Weeks    Status  New    Target Date  01/06/18      PT LONG TERM GOAL #3   Title  Patient will decrease FABQPA to <13 to demonstrate decreased disability and fear of movement fo rimproved quality of life.     Baseline  11/28: 14    Time  8    Period  Weeks    Status  New    Target Date  01/06/18      PT LONG TERM GOAL #4   Title  Patient will tolerate sitting unsupported demonstrating erect sitting posture with minimal thoracic kyphosis for 15+ minutes with maximum of 5/10 back pain to demonstrate improved back extensor strength and improved sitting tolerance.  Baseline  excessive thoracic kyphosis    Time  8    Period  Weeks    Status  New    Target Date  01/06/18            Plan - 11/18/17 1627    Clinical Impression Statement  Noted weakness of scapular  muscles on L with L forward flexion. Improved mobility of lower thoracic region with mobilizations, upper thoracic continues to be limited with decreased pain . Opening of pectoral musculature and release of cervical tightness improved patient posture and decreased pain further. Patient will continue to benefit from skilled physical therapy to decrease pain, improve trunk mobility to increase patient's quality of life.    Rehab Potential  Good    Clinical Impairments Affecting Rehab Potential  (-) HTN, reflux, bilateral TKA, diabetes, history of chronic back pain,recent move (+) good family support and understanding of medical field    PT Frequency  2x / week    PT Duration  8 weeks    PT Treatment/Interventions  ADLs/Self Care Home Management;Aquatic Therapy;Cryotherapy;Electrical Stimulation;Iontophoresis 4mg /ml Dexamethasone;Moist Heat;Traction;Ultrasound;Balance training;Therapeutic exercise;Therapeutic activities;Functional mobility training;Stair training;Gait training;Neuromuscular re-education;Patient/family education;Manual techniques;Passive range of motion;Dry needling;Energy conservation;Taping    PT Next Visit Plan  review HEP, mobilizations of thoracic region     PT Home Exercise Plan  see sheet    Consulted and Agree with Plan of Care  Patient       Patient will benefit from skilled therapeutic intervention in order to improve the following deficits and impairments:  Abnormal gait, Decreased activity tolerance, Decreased balance, Decreased endurance, Decreased mobility, Decreased range of motion, Difficulty walking, Decreased strength, Hypomobility, Impaired flexibility, Impaired perceived functional ability, Increased muscle spasms, Postural dysfunction, Pain, Increased fascial restricitons, Improper body mechanics  Visit Diagnosis: Chronic bilateral low back pain without sciatica  Decreased range of motion of lumbar spine  Other abnormalities of gait and mobility     Problem  List Patient Active Problem List   Diagnosis Date Noted  . Essential hypertension 09/09/2017  . Type 2 diabetes mellitus ([place]) 09/09/2017  . GERD (gastroesophageal reflux disease) 09/09/2017  . Testosterone deficiency 09/09/2017  . OSA (obstructive sleep apnea) 09/09/2017  . Chronic bilateral low back pain without sciatica 09/09/2017  . H/O knee surgery 04/27/2014  . 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 [place], [place], [address] Phone: [phone]   Fax:  [phone]  Name: Adam Wise MRN: [id number] Date of Birth: [birth year]

## 2017-11-25 ENCOUNTER — Encounter: Payer: Self-pay | Admitting: Endocrinology

## 2017-11-25 ENCOUNTER — Ambulatory Visit: Payer: Medicare Other

## 2017-11-25 ENCOUNTER — Ambulatory Visit (INDEPENDENT_AMBULATORY_CARE_PROVIDER_SITE_OTHER): Payer: Medicare Other | Admitting: Endocrinology

## 2017-11-25 VITALS — BP 134/80 | HR 81 | Wt 298.4 lb

## 2017-11-25 DIAGNOSIS — M5386 Other specified dorsopathies, lumbar region: Secondary | ICD-10-CM

## 2017-11-25 DIAGNOSIS — G8929 Other chronic pain: Secondary | ICD-10-CM

## 2017-11-25 DIAGNOSIS — E236 Other disorders of pituitary gland: Secondary | ICD-10-CM | POA: Insufficient documentation

## 2017-11-25 DIAGNOSIS — M545 Low back pain: Secondary | ICD-10-CM | POA: Diagnosis not present

## 2017-11-25 DIAGNOSIS — R2689 Other abnormalities of gait and mobility: Secondary | ICD-10-CM

## 2017-11-25 DIAGNOSIS — E349 Endocrine disorder, unspecified: Secondary | ICD-10-CM

## 2017-11-25 LAB — URINALYSIS, ROUTINE W REFLEX MICROSCOPIC
BILIRUBIN URINE: NEGATIVE
HGB URINE DIPSTICK: NEGATIVE
KETONES UR: NEGATIVE
LEUKOCYTES UA: NEGATIVE
NITRITE: NEGATIVE
RBC / HPF: NONE SEEN (ref 0–?)
Specific Gravity, Urine: 1.02 (ref 1.000–1.030)
Total Protein, Urine: NEGATIVE
URINE GLUCOSE: 100 — AB
UROBILINOGEN UA: 0.2 (ref 0.0–1.0)
WBC UA: NONE SEEN (ref 0–?)
pH: 6 (ref 5.0–8.0)

## 2017-11-25 LAB — CBC WITH DIFFERENTIAL/PLATELET
BASOS PCT: 0.8 % (ref 0.0–3.0)
Basophils Absolute: 0 10*3/uL (ref 0.0–0.1)
EOS ABS: 0.1 10*3/uL (ref 0.0–0.7)
Eosinophils Relative: 1.9 % (ref 0.0–5.0)
HCT: 42.1 % (ref 39.0–52.0)
HEMOGLOBIN: 13.8 g/dL (ref 13.0–17.0)
Lymphocytes Relative: 29.7 % (ref 12.0–46.0)
Lymphs Abs: 1.8 10*3/uL (ref 0.7–4.0)
MCHC: 32.9 g/dL (ref 30.0–36.0)
MCV: 89.4 fl (ref 78.0–100.0)
MONO ABS: 0.5 10*3/uL (ref 0.1–1.0)
Monocytes Relative: 8.3 % (ref 3.0–12.0)
NEUTROS PCT: 59.3 % (ref 43.0–77.0)
Neutro Abs: 3.7 10*3/uL (ref 1.4–7.7)
PLATELETS: 182 10*3/uL (ref 150.0–400.0)
RBC: 4.7 Mil/uL (ref 4.22–5.81)
RDW: 14 % (ref 11.5–15.5)
WBC: 6.2 10*3/uL (ref 4.0–10.5)

## 2017-11-25 LAB — CORTISOL: CORTISOL PLASMA: 5.1 ug/dL

## 2017-11-25 LAB — TSH: TSH: 1.02 u[IU]/mL (ref 0.35–4.50)

## 2017-11-25 LAB — BASIC METABOLIC PANEL
BUN: 18 mg/dL (ref 6–23)
CHLORIDE: 101 meq/L (ref 96–112)
CO2: 26 mEq/L (ref 19–32)
Calcium: 9 mg/dL (ref 8.4–10.5)
Creatinine, Ser: 1.44 mg/dL (ref 0.40–1.50)
GFR: 51.95 mL/min — AB (ref >60.00)
Glucose, Bld: 136 mg/dL — ABNORMAL HIGH (ref 70–99)
POTASSIUM: 4.5 meq/L (ref 3.5–5.1)
SODIUM: 137 meq/L (ref 135–145)

## 2017-11-25 LAB — T4, FREE: Free T4: 0.74 ng/dL (ref 0.60–1.60)

## 2017-11-25 LAB — FOLLICLE STIMULATING HORMONE: FSH: 7.1 m[IU]/mL (ref 1.4–18.1)

## 2017-11-25 LAB — LUTEINIZING HORMONE: LH: 1.03 m[IU]/mL — AB (ref 1.50–9.30)

## 2017-11-25 NOTE — Therapy (Signed)
[place] MAIN [facility] SERVICES [address] [place], [place], [address] Phone: [phone]   Fax:  [phone]  Physical Therapy Treatment  Patient Details  Name: Adam Wise MRN: [id number] Date of Birth: October 23, 1951 Referring Provider: Alma Friendly   Encounter Date: 11/25/2017  PT End of Session - 11/25/17 0910    Visit Number  4    Number of Visits  16    Date for PT Re-Evaluation  01/06/18    Authorization - Visit Number  4    Authorization - Number of Visits  10    PT Start Time  0845    PT Stop Time  0930    PT Time Calculation (min)  45 min    Activity Tolerance  Patient tolerated treatment well;Patient limited by pain    Behavior During Therapy  [facility] for tasks assessed/performed       Past Medical History:  Diagnosis Date  . Chickenpox   . Chronic knee pain   . Chronic low back pain   . Essential hypertension   . GERD (gastroesophageal reflux disease)   . OSA (obstructive sleep apnea)   . Testosterone deficiency   . Type 2 diabetes mellitus ([place])     Past Surgical History:  Procedure Laterality Date  . REPLACEMENT TOTAL KNEE BILATERAL  2015  . TONSILLECTOMY  1960    There were no vitals filed for this visit.  Subjective Assessment - 11/25/17 0846    Subjective  Patient had to push a car because a person was stuck in front of his driveway. Felt stiff the next day. Reports no jerking of back at this time, Did jerk back when he  slipped on ice coming out of house.      Pertinent History  Patient is a pleasant 66 year old male who presents to physical therapy for mid to upper back pain. Had PT 6 months ago for his back in [place]. Reports having sciatica two years ago in 2016, went away with acupuncture. Right IT band has been tight ever since 2016. Pain occurs across upper lumbar and will spread to between shoulder blades with occasional tingling in fingers when sleeping on back at night. Pt. Has a history of two  knee replacements and was a Public librarian. He and his wife recently moved to [place] and a currently living with their daughter while searching for a home.     Limitations  Lifting;Standing;House hold activities;Other (comment)    How long can you sit comfortably?  doesn't affect    How long can you stand comfortably?  half hour    Diagnostic tests  x ray     Patient Stated Goals  get rid of pain    Currently in Pain?  Yes    Pain Score  1     Pain Location  Back    Pain Orientation  Mid    Pain Descriptors / Indicators  Aching;Discomfort    Pain Type  Chronic pain    Pain Onset  More than a month ago    Pain Frequency  Constant       Manual: Mobilizations grade I-IV CPAs and UPAs , decreased hypombility in lower thoracic region, continued hypomobility in upper thoracic    Suboccipital release : 2x 30 seconds  Cervical SB with overpressure at shoulders supine stretch 2x 30, cervical rotation with overpressure at shoulders 2x 30 seconds    TherEx Open prayer book stretch supine 2x each direction  30 seconds each, overpressure applied at hip and wrist 2x 20 seconds  Robber stretch 60 seconds  Face pulls with cable machine 2.5#  12x  Single arm rows cable machine 2.5 # 2x 12x, cues for keeping elbow in and shoulder down  Straight arm lat pull down cable machine 2.5# 12, cues for keeping  shoulders back; 7.5 # 12x.  Bent over row 7.5# 2x12    pec stretch doorway 3x 30 seconds  Wall posture 5x 10 seconds     Pt. response to medical necessity: Patient will continue to benefit from skilled physical therapy to decrease pain, improve trunk mobility to increase patient's quality of life.                          PT Education - 11/25/17 0910    Education Details  posture and thoricic mobility/body mechanics    Person(s) Educated  Patient    Methods  Explanation;Demonstration;Verbal cues    Comprehension  Verbalized understanding;Returned  demonstration       PT Short Term Goals - 11/11/17 1714      PT SHORT TERM GOAL #1   Title  Patient will be independent in home exercise program to improve strength/mobility for better functional independence with ADLs.    Baseline  HEP given    Time  2    Period  Weeks    Status  New    Target Date  11/25/17      PT SHORT TERM GOAL #2   Title  Patient will report a worst pain of 4/10 on VAS in  thoracic back   to improve tolerance with ADLs and reduced symptoms with activities.     Baseline  worst 6/10    Time  2    Period  Weeks    Status  New    Target Date  11/25/17        PT Long Term Goals - 11/11/17 1716      PT LONG TERM GOAL #1   Title  Patient will report a worst pain of 1/10 on VAS in thoracic back  to improve tolerance with ADLs and reduced symptoms with activities.     Baseline  11/28: 6/10    Time  8    Period  Weeks    Status  New    Target Date  01/06/18      PT LONG TERM GOAL #2   Title  Patient will reduce modified Oswestry score to <20 as to demonstrate minimal disability with ADLs including improved sleeping tolerance, walking/sitting tolerance etc for better mobility with ADLs.     Baseline  11/28: 36%    Time  8    Period  Weeks    Status  New    Target Date  01/06/18      PT LONG TERM GOAL #3   Title  Patient will decrease FABQPA to <13 to demonstrate decreased disability and fear of movement fo rimproved quality of life.     Baseline  11/28: 14    Time  8    Period  Weeks    Status  New    Target Date  01/06/18      PT LONG TERM GOAL #4   Title  Patient will tolerate sitting unsupported demonstrating erect sitting posture with minimal thoracic kyphosis for 15+ minutes with maximum of 5/10 back pain to demonstrate improved back extensor strength and improved sitting tolerance.  Baseline  excessive thoracic kyphosis    Time  8    Period  Weeks    Status  New    Target Date  01/06/18            Plan - 11/25/17 0912    Clinical  Impression Statement  Patient has increased hypomobility in thoracic spine from slipping on ice, improved with manual treatment. Patient cervicothoracic musculature decreased in tightness and pain after manual tx. Opening of pectoral musculature and strengthening of postural musculature decreased pain. Patient will continue to benefit from skilled physical therapy to decrease pain, improve trunk mobility to increase patient's quality of life.     Rehab Potential  Good    Clinical Impairments Affecting Rehab Potential  (-) HTN, reflux, bilateral TKA, diabetes, history of chronic back pain,recent move (+) good family support and understanding of medical field    PT Frequency  2x / week    PT Duration  8 weeks    PT Treatment/Interventions  ADLs/Self Care Home Management;Aquatic Therapy;Cryotherapy;Electrical Stimulation;Iontophoresis 4mg /ml Dexamethasone;Moist Heat;Traction;Ultrasound;Balance training;Therapeutic exercise;Therapeutic activities;Functional mobility training;Stair training;Gait training;Neuromuscular re-education;Patient/family education;Manual techniques;Passive range of motion;Dry needling;Energy conservation;Taping    PT Next Visit Plan  review HEP, mobilizations of thoracic region     PT Home Exercise Plan  see sheet    Consulted and Agree with Plan of Care  Patient       Patient will benefit from skilled therapeutic intervention in order to improve the following deficits and impairments:  Abnormal gait, Decreased activity tolerance, Decreased balance, Decreased endurance, Decreased mobility, Decreased range of motion, Difficulty walking, Decreased strength, Hypomobility, Impaired flexibility, Impaired perceived functional ability, Increased muscle spasms, Postural dysfunction, Pain, Increased fascial restricitons, Improper body mechanics  Visit Diagnosis: Chronic bilateral low back pain without sciatica  Decreased range of motion of lumbar spine  Other abnormalities of gait and  mobility     Problem List Patient Active Problem List   Diagnosis Date Noted  . Essential hypertension 09/09/2017  . Type 2 diabetes mellitus ([place]) 09/09/2017  . GERD (gastroesophageal reflux disease) 09/09/2017  . Testosterone deficiency 09/09/2017  . OSA (obstructive sleep apnea) 09/09/2017  . Chronic bilateral low back pain without sciatica 09/09/2017  . H/O knee surgery 04/27/2014  . 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

## 2017-11-25 NOTE — Patient Instructions (Addendum)
blood tests are requested for you today.  We'll let you know about the results. Based on the results, you may be able to take a pill for the testosterone. Testosterone treatment has risks, including increased or decreased fertility (depending on the type of treatment), hair loss, prostate cancer, benign prostate enlargement, blood clots, liver problems, lower hdl ("good cholesterol"), polycythemia (opposite of anemia), sleep apnea, and behavior changes.  Weight loss helps the testosterone, also.   Please come back for a follow-up appointment in 6 months.

## 2017-11-25 NOTE — Progress Notes (Signed)
Subjective:    Patient ID: Adam Wise, male    DOB: Nov 14, 1951, 66 y.o.   MRN: [id number]  HPI Pt is referred by Dr Bernardo Heater, for hypogonadism and pituitary cyst.  Pt reports he had puberty at the normal age.  He has 2 biological children.  He says he has never taken illicit androgens.  He does not take antiandrogens or opioids.  He denies any h/o infertility, XRT, or genital infection.  He has never had surgery, or a serious injury to the head or genital area. He has no h/o DVT.   He does not consume alcohol excessively.  He was started on depo-testosterone in 2013 (last dose was 4 days ago, and was less than a full dosage, due to running out).  In late 2018, testosterone was found to be low, so he had MRI.  He has sleep apnea.  He reports moderate muscle weakness, and assoc mood swings.  Pt says sister had a tumor "near the pituitary."   Pt says he recently had PSA.   Past Medical History:  Diagnosis Date  . Chickenpox   . Chronic knee pain   . Chronic low back pain   . Essential hypertension   . GERD (gastroesophageal reflux disease)   . OSA (obstructive sleep apnea)   . Testosterone deficiency   . Type 2 diabetes mellitus ([place])     Past Surgical History:  Procedure Laterality Date  . REPLACEMENT TOTAL KNEE BILATERAL  2015  . TONSILLECTOMY  1960    Social History   Socioeconomic History  . Marital status: Married    Spouse name: Not on file  . Number of children: Not on file  . Years of education: Not on file  . Highest education level: Not on file  Social Needs  . Financial resource strain: Not on file  . Food insecurity - worry: Not on file  . Food insecurity - inability: Not on file  . Transportation needs - medical: Not on file  . Transportation needs - non-medical: Not on file  Occupational History  . Not on file  Tobacco Use  . Smoking status: Former Research scientist (life sciences)  . Smokeless tobacco: Never Used  Substance and Sexual Activity  . Alcohol use: Yes  . Drug use: Not on  file  . Sexual activity: Not on file  Other Topics Concern  . Not on file  Social History Narrative   Married.   Moved from [place].   Retired.    Current Outpatient Medications on File Prior to Visit  Medication Sig Dispense Refill  . amLODipine (NORVASC) 10 MG tablet Take 1 tablet (10 mg total) daily by mouth. 90 tablet 1  . aspirin EC 81 MG tablet Take by mouth.    . Coenzyme Q10 (COQ10 PO) Take 300 mg by mouth daily.    [place] Kitchen esomeprazole (NEXIUM) 20 MG capsule Take 20 mg by mouth daily at 12 noon.    Mar[place] Kitchen glipiZIDE (GLUCOTROL) 10 MG tablet Take 1 tablet (10 mg total) by mouth 2 (two) times daily before a meal. 180 tablet 1  . KRILL OIL PO Take 350 mg by mouth daily.    [place] Kitchen lisinopril (PRINIVIL,ZESTRIL) 5 MG tablet Take 5 mg by mouth daily.    . Magnesium 500 MG TABS Take 500 mg by mouth daily.    . metFORMIN (GLUCOPHAGE) 1000 MG tablet Take 1 tablet (1,000 mg total) by mouth 2 (two) times daily with a meal. 180 tablet 1  . Multiple Vitamin (MULTI-VITAMINS) TABS  Take 1 tablet by mouth daily.     . pioglitazone (ACTOS) 45 MG tablet Take 45 mg by mouth daily.    . sildenafil (VIAGRA) 50 MG tablet Take 50 mg by mouth daily as needed for erectile dysfunction.    . SYRINGE DISPOSABLE 3CC 3 ML MISC Use as instructed for injection 100 each 2  . testosterone cypionate (DEPOTESTOSTERONE CYPIONATE) 200 MG/ML injection Inject 1 mL (200 mg total) into the muscle every 14 (fourteen) days. 10 mL 0   No current facility-administered medications on file prior to visit.     Allergies  Allergen Reactions  . Bupropion     Racing heart    Family History  Problem Relation Age of Onset  . Cancer Mother   . Hypertension Mother   . Arthritis Father   . Asthma Father   . Cancer Father   . COPD Father   . Heart attack Father   . Hypertension Sister   . Cancer Sister   . Diabetes Sister   . Asthma Son   . Birth defects Maternal Grandfather   . Arthritis Paternal Grandmother   . Diabetes Paternal  Grandmother   . Arthritis Paternal Grandfather   . Asthma Sister   . Cancer Sister   . COPD Sister   . Arthritis Sister   . Asthma Sister   . Diabetes Sister   . Other Neg Hx        pituitary disorder    BP 134/80 (BP Location: Left Wrist, Patient Position: Sitting, Cuff Size: Normal)   Pulse 81   Wt 298 lb 6.4 oz (135.4 kg)   SpO2 97%   BMI 44.07 kg/m     Review of Systems denies depression, numbness, decreased urinary stream, gynecomastia, fever, easy bruising, sob, rash, blurry vision, rhinorrhea, chest pain.  He has fatigue, weight gain, headache, and ED.       Objective:   Physical Exam VS: see vs page GEN: no distress HEAD: head: no deformity eyes: no periorbital swelling, no proptosis external nose and ears are normal mouth: no lesion seen NECK: supple, thyroid is not enlarged CHEST WALL: no deformity LUNGS: clear to auscultation CV: reg rate and rhythm, no murmur ABD: abdomen is soft, nontender.  no hepatosplenomegaly.  not distended.  Self-reducing ventral hernia MUSCULOSKELETAL: muscle bulk and strength are grossly normal.  no obvious joint swelling.  gait is normal and steady EXTEMITIES: no deformity.  no ulcer on the feet.  feet are of normal color and temp.  1+ bilat leg edema.  PULSES: dorsalis pedis intact bilat.  no carotid bruit NEURO:  cn 2-12 grossly intact.   readily moves all 4's.  sensation is intact to touch on the feet.  SKIN:  Normal texture and temperature.  No rash or suspicious lesion is visible.   NODES:  None palpable at the neck PSYCH: alert, well-oriented.  Does not appear anxious nor depressed.  MRI: 3 mm left sellar cyst which could reflect a cystic microadenoma.   Lab Results  Component Value Date   WBC 6.2 11/25/2017   HGB 13.8 11/25/2017   HCT 42.1 11/25/2017   MCV 89.4 11/25/2017   PLT 182.0 11/25/2017   I have reviewed outside records, and summarized: Pt was noted to have low testosterone, and referred here.  He did not  have any urologic signs or sxs.       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  Patient Instructions  blood tests are requested for you today.  We'll let you know about the results. Based on the results, you may be able to take a pill for the testosterone. Testosterone treatment has risks, including increased or decreased fertility (depending on the type of treatment), hair loss, prostate cancer, benign prostate enlargement, blood clots, liver problems, lower hdl ("good cholesterol"), polycythemia (opposite of anemia), sleep apnea, and behavior changes.  Weight loss helps the testosterone, also.   Please come back for a follow-up appointment in 6 months.

## 2017-11-26 LAB — PROLACTIN: PROLACTIN: 3.5 ng/mL (ref 2.0–18.0)

## 2017-11-27 LAB — TESTOSTERONE,FREE AND TOTAL
Testosterone, Free: 7.5 pg/mL (ref 6.6–18.1)
Testosterone: 308 ng/dL (ref 264–916)

## 2017-12-01 ENCOUNTER — Ambulatory Visit: Payer: Medicare Other

## 2017-12-01 DIAGNOSIS — M5386 Other specified dorsopathies, lumbar region: Secondary | ICD-10-CM

## 2017-12-01 DIAGNOSIS — G8929 Other chronic pain: Secondary | ICD-10-CM

## 2017-12-01 DIAGNOSIS — R2689 Other abnormalities of gait and mobility: Secondary | ICD-10-CM

## 2017-12-01 DIAGNOSIS — M545 Low back pain: Secondary | ICD-10-CM | POA: Diagnosis not present

## 2017-12-01 NOTE — Therapy (Signed)
[place] MAIN [facility] SERVICES [address] [place], [place], [address] Phone: [phone]   Fax:  [phone]  Physical Therapy Treatment  Patient Details  Name: Adam Wise MRN: [id number] Date of Birth: November 06, 1951 Referring Provider: Alma Friendly   Encounter Date: 12/01/2017  PT End of Session - 12/01/17 1713    Visit Number  5    Number of Visits  16    Date for PT Re-Evaluation  01/06/18    Authorization - Visit Number  5    Authorization - Number of Visits  10    PT Start Time  1620    PT Stop Time  1706    PT Time Calculation (min)  46 min    Activity Tolerance  Patient tolerated treatment well;Patient limited by pain    Behavior During Therapy  [facility] for tasks assessed/performed       Past Medical History:  Diagnosis Date  . Chickenpox   . Chronic knee pain   . Chronic low back pain   . Essential hypertension   . GERD (gastroesophageal reflux disease)   . OSA (obstructive sleep apnea)   . Testosterone deficiency   . Type 2 diabetes mellitus ([place])     Past Surgical History:  Procedure Laterality Date  . REPLACEMENT TOTAL KNEE BILATERAL  2015  . TONSILLECTOMY  1960    There were no vitals filed for this visit.  Subjective Assessment - 12/01/17 1623    Subjective  Patient went to doctor for pituaitary and testosterone imbalances. Had more bloodtests done. Wants to get a secondary opinion. Felt back move again over the weekend.     Pertinent History  Patient is a pleasant 66 year old male who presents to physical therapy for mid to upper back pain. Had PT 6 months ago for his back in [place]. Reports having sciatica two years ago in 2016, went away with acupuncture. Right IT band has been tight ever since 2016. Pain occurs across upper lumbar and will spread to between shoulder blades with occasional tingling in fingers when sleeping on back at night. Pt. Has a history of two knee replacements and was a Radio producer. He and his wife recently moved to [place] and a currently living with their daughter while searching for a home.     Limitations  Lifting;Standing;House hold activities;Other (comment)    How long can you sit comfortably?  doesn't affect    How long can you stand comfortably?  half hour    Diagnostic tests  x ray     Patient Stated Goals  get rid of pain    Currently in Pain?  Yes    Pain Score  2     Pain Location  Back    Pain Orientation  Mid    Pain Descriptors / Indicators  Aching;Discomfort    Pain Type  Chronic pain    Pain Onset  More than a month ago    Pain Frequency  Constant       Manual: Mobilizations grade I-IV CPAs and UPAs , decreased hypombility in lower thoracic region, continued hypomobility in upper thoracic    Suboccipital release : 3x 30 seconds   Cervical SB with overpressure at shoulders supine stretch 2x 30, cervical rotation with overpressure at shoulders 2x 30 seconds    Upper thoracic and upper trap stretch of LUE.   TherEx Open prayer book stretch supine 2x each direction 30 seconds each, overpressure applied at  hip and wrist 2x 20 seconds  Robber stretch 60 seconds Back extensions with half foam roller across chair in thoracic region. 10x  Supine over half foam roller thoracic stretch 30 seconds.    Face pulls with cable machine 2.5#  12x  Terminated due to pain  pec stretch doorway 3x 30 seconds     Pt. response to medical necessity: Patient will continue to benefit from skilled physical therapy to decrease pain, improve trunk mobility to increase patient's quality of life                        PT Education - 12/01/17 1712    Education provided  Yes    Education Details  posture and thoracic mobility     Person(s) Educated  Patient    Methods  Explanation;Demonstration;Verbal cues    Comprehension  Verbalized understanding;Returned demonstration       PT Short Term Goals - 11/11/17 1714      PT  SHORT TERM GOAL #1   Title  Patient will be independent in home exercise program to improve strength/mobility for better functional independence with ADLs.    Baseline  HEP given    Time  2    Period  Weeks    Status  New    Target Date  11/25/17      PT SHORT TERM GOAL #2   Title  Patient will report a worst pain of 4/10 on VAS in  thoracic back   to improve tolerance with ADLs and reduced symptoms with activities.     Baseline  worst 6/10    Time  2    Period  Weeks    Status  New    Target Date  11/25/17        PT Long Term Goals - 11/11/17 1716      PT LONG TERM GOAL #1   Title  Patient will report a worst pain of 1/10 on VAS in thoracic back  to improve tolerance with ADLs and reduced symptoms with activities.     Baseline  11/28: 6/10    Time  8    Period  Weeks    Status  New    Target Date  01/06/18      PT LONG TERM GOAL #2   Title  Patient will reduce modified Oswestry score to <20 as to demonstrate minimal disability with ADLs including improved sleeping tolerance, walking/sitting tolerance etc for better mobility with ADLs.     Baseline  11/28: 36%    Time  8    Period  Weeks    Status  New    Target Date  01/06/18      PT LONG TERM GOAL #3   Title  Patient will decrease FABQPA to <13 to demonstrate decreased disability and fear of movement fo rimproved quality of life.     Baseline  11/28: 14    Time  8    Period  Weeks    Status  New    Target Date  01/06/18      PT LONG TERM GOAL #4   Title  Patient will tolerate sitting unsupported demonstrating erect sitting posture with minimal thoracic kyphosis for 15+ minutes with maximum of 5/10 back pain to demonstrate improved back extensor strength and improved sitting tolerance.     Baseline  excessive thoracic kyphosis    Time  8    Period  Weeks    Status  New    Target Date  01/06/18            Plan - 12/01/17 1715    Clinical Impression Statement  Patient presents with increased hypomobility of  thoracic region due to recent stress related incident and "tweaking of back'. Hypomobility improved with repetition of mobilizations. Patient L shoulder limited postural strengthening interventions. Patient will continue to benefit from skilled physical therapy to decrease pain, improve trunk mobility to increase patient's quality of life    Rehab Potential  Good    Clinical Impairments Affecting Rehab Potential  (-) HTN, reflux, bilateral TKA, diabetes, history of chronic back pain,recent move (+) good family support and understanding of medical field    PT Frequency  2x / week    PT Duration  8 weeks    PT Treatment/Interventions  ADLs/Self Care Home Management;Aquatic Therapy;Cryotherapy;Electrical Stimulation;Iontophoresis 4mg /ml Dexamethasone;Moist Heat;Traction;Ultrasound;Balance training;Therapeutic exercise;Therapeutic activities;Functional mobility training;Stair training;Gait training;Neuromuscular re-education;Patient/family education;Manual techniques;Passive range of motion;Dry needling;Energy conservation;Taping    PT Next Visit Plan  review HEP, mobilizations of thoracic region     PT Home Exercise Plan  see sheet    Consulted and Agree with Plan of Care  Patient       Patient will benefit from skilled therapeutic intervention in order to improve the following deficits and impairments:  Abnormal gait, Decreased activity tolerance, Decreased balance, Decreased endurance, Decreased mobility, Decreased range of motion, Difficulty walking, Decreased strength, Hypomobility, Impaired flexibility, Impaired perceived functional ability, Increased muscle spasms, Postural dysfunction, Pain, Increased fascial restricitons, Improper body mechanics  Visit Diagnosis: Chronic bilateral low back pain without sciatica  Decreased range of motion of lumbar spine  Other abnormalities of gait and mobility     Problem List Patient Active Problem List   Diagnosis Date Noted  . Pituitary cyst ([place])  11/25/2017  . Essential hypertension 09/09/2017  . Type 2 diabetes mellitus ([place]) 09/09/2017  . GERD (gastroesophageal reflux disease) 09/09/2017  . Testosterone deficiency 09/09/2017  . OSA (obstructive sleep apnea) 09/09/2017  . Chronic bilateral low back pain without sciatica 09/09/2017  . H/O knee surgery 04/27/2014  . 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

## 2017-12-02 LAB — ACTH: C206 ACTH: 12 pg/mL (ref 6–50)

## 2017-12-02 LAB — INSULIN-LIKE GROWTH FACTOR
IGF-I, LC/MS: 254 ng/mL (ref 41–279)
Z-Score (Male): 1.8 SD (ref ?–2.0)

## 2017-12-02 LAB — ARGININE VASOPRESSIN HORMONE: ARGININE VASOPRESSIN: 1.2 pg/mL

## 2017-12-09 ENCOUNTER — Ambulatory Visit: Payer: Medicare Other

## 2017-12-09 DIAGNOSIS — M5386 Other specified dorsopathies, lumbar region: Secondary | ICD-10-CM | POA: Diagnosis not present

## 2017-12-09 DIAGNOSIS — R2689 Other abnormalities of gait and mobility: Secondary | ICD-10-CM

## 2017-12-09 DIAGNOSIS — M545 Low back pain: Principal | ICD-10-CM

## 2017-12-09 DIAGNOSIS — G8929 Other chronic pain: Secondary | ICD-10-CM | POA: Diagnosis not present

## 2017-12-09 NOTE — Therapy (Signed)
[place] MAIN [facility] SERVICES [address] [place], [place], [address] Phone: [phone]   Fax:  [phone]  Physical Therapy Treatment  Patient Details  Name: Brodie Correll MRN: [id number] Date of Birth: 07/10/1951 Referring Provider: Alma Friendly   Encounter Date: 12/09/2017  PT End of Session - 12/09/17 1418    Visit Number  6    Number of Visits  16    Date for PT Re-Evaluation  01/06/18    Authorization - Visit Number  6    Authorization - Number of Visits  10    PT Start Time  9833    PT Stop Time  1430    PT Time Calculation (min)  45 min    Activity Tolerance  Patient tolerated treatment well;Patient limited by pain    Behavior During Therapy  [facility] for tasks assessed/performed       Past Medical History:  Diagnosis Date  . Chickenpox   . Chronic knee pain   . Chronic low back pain   . Essential hypertension   . GERD (gastroesophageal reflux disease)   . OSA (obstructive sleep apnea)   . Testosterone deficiency   . Type 2 diabetes mellitus ([place])     Past Surgical History:  Procedure Laterality Date  . REPLACEMENT TOTAL KNEE BILATERAL  2015  . TONSILLECTOMY  1960    There were no vitals filed for this visit.  Subjective Assessment - 12/09/17 1347    Subjective  Patient had one bad day about two days ago, woke up with  3/10 pain. No stretching helped. Last night moved and felt a clunk with pain relief. Moved boxes all day today and yesterday because of moving to new house.     Pertinent History  Patient is a pleasant 66 year old male who presents to physical therapy for mid to upper back pain. Had PT 6 months ago for his back in [place]. Reports having sciatica two years ago in 2016, went away with acupuncture. Right IT band has been tight ever since 2016. Pain occurs across upper lumbar and will spread to between shoulder blades with occasional tingling in fingers when sleeping on back at night. Pt. 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     Limitations  Lifting;Standing;House hold activities;Other (comment)    How long can you sit comfortably?  doesn't affect    How long can you stand comfortably?  half hour    Diagnostic tests  x ray     Patient Stated Goals  get rid of pain    Currently in Pain?  Yes    Pain Score  1     Pain Location  Back    Pain Orientation  Mid    Pain Descriptors / Indicators  Aching;Discomfort    Pain Type  Chronic pain    Pain Onset  More than a month ago    Pain Frequency  Constant         Manual: Mobilizations grade I-IV CPAs and UPAs , decreased hypombility in lower thoracic region, continued hypomobility in upper thoracic    Suboccipital release : 3x 30 seconds   Cervical SB with overpressure at shoulders supine stretch 2x 30, cervical rotation with overpressure at shoulders 2x 30 seconds    Upper thoracic and upper trap stretch of LUE  and RUE, all head positions 2x 20 seconds each head position each UE.    TherEx Open prayer book stretch supine 2x each direction 30 seconds each, overpressure applied at hip and wrist 2x 20 seconds    BUE row with Matrix cable machine 7.5 lb10x. Cues for scapular retraction  SUE row with matrix cable 7.5 lb 10x   pec stretch doorway 3x 30 seconds (BUE , one at a time) head turn opp direction.     Pt. response to medical necessity: Patient will continue to benefit from skilled physical therapy to decrease pain, improve trunk mobility to increase patient's quality of life                    PT Education - 12/09/17 1418    Education provided  Yes    Education Details  posture and thoracic mobility, moving body mechanics    Person(s) Educated  Patient    Methods  Explanation;Demonstration;Verbal cues    Comprehension  Verbalized  understanding;Returned demonstration       PT Short Term Goals - 11/11/17 1714      PT SHORT TERM GOAL #1   Title  Patient will be independent in home exercise program to improve strength/mobility for better functional independence with ADLs.    Baseline  HEP given    Time  2    Period  Weeks    Status  New    Target Date  11/25/17      PT SHORT TERM GOAL #2   Title  Patient will report a worst pain of 4/10 on VAS in  thoracic back   to improve tolerance with ADLs and reduced symptoms with activities.     Baseline  worst 6/10    Time  2    Period  Weeks    Status  New    Target Date  11/25/17        PT Long Term Goals - 11/11/17 1716      PT LONG TERM GOAL #1   Title  Patient will report a worst pain of 1/10 on VAS in thoracic back  to improve tolerance with ADLs and reduced symptoms with activities.     Baseline  11/28: 6/10    Time  8    Period  Weeks    Status  New    Target Date  01/06/18      PT LONG TERM GOAL #2   Title  Patient will reduce modified Oswestry score to <20 as to demonstrate minimal disability with ADLs including improved sleeping tolerance, walking/sitting tolerance etc for better mobility with ADLs.     Baseline  11/28: 36%    Time  8    Period  Weeks    Status  New    Target Date  01/06/18      PT LONG TERM GOAL #3   Title  Patient will decrease FABQPA to <13 to demonstrate decreased disability and fear of movement fo rimproved quality of life.     Baseline  11/28: 14    Time  8    Period  Weeks    Status  New    Target Date  01/06/18      PT LONG TERM GOAL #4   Title  Patient will tolerate sitting unsupported demonstrating erect sitting posture with minimal thoracic kyphosis for 15+ minutes with maximum of 5/10 back pain to demonstrate improved back extensor strength and improved sitting tolerance.  Baseline  excessive thoracic kyphosis    Time  8    Period  Weeks    Status  New    Target Date  01/06/18            Plan -  12/09/17 1421    Clinical Impression Statement  Patient has decreased thoracolumbar mobility due to recent increase in lifting heavy boxes while moving. Patient mobility improved after manual tx. Noted hypomobility of T10 improved with repetition. Patient pain decreased after session. Patient will continue to benefit from skilled physical therapy to decrease pain, improve trunk mobility to increase patient's quality of life    Rehab Potential  Good    Clinical Impairments Affecting Rehab Potential  (-) HTN, reflux, bilateral TKA, diabetes, history of chronic back pain,recent move (+) good family support and understanding of medical field    PT Frequency  2x / week    PT Duration  8 weeks    PT Treatment/Interventions  ADLs/Self Care Home Management;Aquatic Therapy;Cryotherapy;Electrical Stimulation;Iontophoresis 4mg /ml Dexamethasone;Moist Heat;Traction;Ultrasound;Balance training;Therapeutic exercise;Therapeutic activities;Functional mobility training;Stair training;Gait training;Neuromuscular re-education;Patient/family education;Manual techniques;Passive range of motion;Dry needling;Energy conservation;Taping    PT Next Visit Plan  review HEP, mobilizations of thoracic region     PT Home Exercise Plan  see sheet    Consulted and Agree with Plan of Care  Patient       Patient will benefit from skilled therapeutic intervention in order to improve the following deficits and impairments:  Abnormal gait, Decreased activity tolerance, Decreased balance, Decreased endurance, Decreased mobility, Decreased range of motion, Difficulty walking, Decreased strength, Hypomobility, Impaired flexibility, Impaired perceived functional ability, Increased muscle spasms, Postural dysfunction, Pain, Increased fascial restricitons, Improper body mechanics  Visit Diagnosis: Chronic bilateral low back pain without sciatica  Decreased range of motion of lumbar spine  Other abnormalities of gait and  mobility     Problem List Patient Active Problem List   Diagnosis Date Noted  . 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 Essential hypertension 09/09/2017  . Type 2 diabetes mellitus ([place]) 09/09/2017  . GERD (gastroesophageal reflux disease) 09/09/2017  . Testosterone deficiency 09/09/2017  . OSA (obstructive sleep apnea) 09/09/2017  . Chronic bilateral low back pain without sciatica 09/09/2017  . H/O knee surgery 04/27/2014  . 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

## 2017-12-14 ENCOUNTER — Ambulatory Visit: Payer: Medicare Other

## 2017-12-14 DIAGNOSIS — G8929 Other chronic pain: Secondary | ICD-10-CM

## 2017-12-14 DIAGNOSIS — M545 Low back pain: Principal | ICD-10-CM

## 2017-12-14 DIAGNOSIS — M5386 Other specified dorsopathies, lumbar region: Secondary | ICD-10-CM

## 2017-12-14 DIAGNOSIS — R2689 Other abnormalities of gait and mobility: Secondary | ICD-10-CM | POA: Diagnosis not present

## 2017-12-14 NOTE — Therapy (Signed)
[place] MAIN [facility] SERVICES [address] [place], [place], [address] Phone: [phone]   Fax:  [phone]  Physical Therapy Treatment  Patient Details  Name: Adam Wise MRN: [id number] Date of Birth: 12/07/1951 Referring Provider: Alma Friendly   Encounter Date: 12/14/2017  PT End of Session - 12/14/17 1649    Visit Number  7    Number of Visits  16    Date for PT Re-Evaluation  01/06/18    Authorization - Visit Number  7    Authorization - Number of Visits  10    PT Start Time  8177    PT Stop Time  1646    PT Time Calculation (min)  45 min    Activity Tolerance  Patient tolerated treatment well;Patient limited by pain    Behavior During Therapy  [facility] for tasks assessed/performed       Past Medical History:  Diagnosis Date  . Chickenpox   . Chronic knee pain   . Chronic low back pain   . Essential hypertension   . GERD (gastroesophageal reflux disease)   . OSA (obstructive sleep apnea)   . Testosterone deficiency   . Type 2 diabetes mellitus ([place])     Past Surgical History:  Procedure Laterality Date  . REPLACEMENT TOTAL KNEE BILATERAL  2015  . TONSILLECTOMY  1960    There were no vitals filed for this visit.  Subjective Assessment - 12/14/17 1648    Subjective  Patient moved into new home betweeen last session. Having increased stiffness due to moving and increased pain, pain has decreased from 4/10 to 3/10 today.     Pertinent History  Patient is a pleasant 66 year old male who presents to physical therapy for mid to upper back pain. Had PT 6 months ago for his back in [place]. Reports having sciatica two years ago in 2016, went away with acupuncture. Right IT band has been tight ever since 2016. Pain occurs across upper lumbar and will spread to between shoulder blades with occasional tingling in fingers when sleeping on back at night. Pt. Has a history of two knee replacements and was a [employer] cabin crew. He and his wife recently moved to [place] and a currently living with their daughter while searching for a home.     Limitations  Lifting;Standing;House hold activities;Other (comment)    How long can you sit comfortably?  doesn't affect    How long can you stand comfortably?  half hour    Diagnostic tests  x ray     Patient Stated Goals  get rid of pain    Currently in Pain?  Yes    Pain Score  4     Pain Location  Back    Pain Orientation  Mid    Pain Descriptors / Indicators  Aching;Discomfort    Pain Type  Chronic pain    Pain Onset  More than a month ago    Pain Frequency  Constant         Manual: Mobilizations grade I-IV CPAs and UPAs , decreased hypombility in lower thoracic region, continued hypomobility in upper thoracic    Suboccipital release : 3x 30 seconds   Cervical SB with overpressure at shoulders supine stretch 2x 30, cervical rotation with overpressure at shoulders 2x 30 seconds    Upper thoracic and upper trap stretch of LUE and RUE, all head positions 2x 20 seconds each head position each UE.  Subscap STM L and R in supine with hand on forehead.   TherEx Open prayer book stretch supine 2x each direction 30 seconds each, overpressure applied at hip and wrist 2x 20 seconds    Seated postural with scapular retractions 10x    Patient's pain decreased to 1/ 10 after manual tx.    Pt. response to medical necessity: Patient will continue to benefit from skilled physical therapy to decrease pain, improve trunk mobility to increase patient's quality of life                      PT Education - 12/14/17 1649    Education provided  Yes    Education Details  posture and scapular control     Person(s) Educated  Patient    Methods  Explanation;Demonstration;Verbal cues    Comprehension  Verbalized understanding;Returned demonstration       PT Short Term Goals - 11/11/17 1714      PT SHORT TERM GOAL #1   Title  Patient will  be independent in home exercise program to improve strength/mobility for better functional independence with ADLs.    Baseline  HEP given    Time  2    Period  Weeks    Status  New    Target Date  11/25/17      PT SHORT TERM GOAL #2   Title  Patient will report a worst pain of 4/10 on VAS in  thoracic back   to improve tolerance with ADLs and reduced symptoms with activities.     Baseline  worst 6/10    Time  2    Period  Weeks    Status  New    Target Date  11/25/17        PT Long Term Goals - 11/11/17 1716      PT LONG TERM GOAL #1   Title  Patient will report a worst pain of 1/10 on VAS in thoracic back  to improve tolerance with ADLs and reduced symptoms with activities.     Baseline  11/28: 6/10    Time  8    Period  Weeks    Status  New    Target Date  01/06/18      PT LONG TERM GOAL #2   Title  Patient will reduce modified Oswestry score to <20 as to demonstrate minimal disability with ADLs including improved sleeping tolerance, walking/sitting tolerance etc for better mobility with ADLs.     Baseline  11/28: 36%    Time  8    Period  Weeks    Status  New    Target Date  01/06/18      PT LONG TERM GOAL #3   Title  Patient will decrease FABQPA to <13 to demonstrate decreased disability and fear of movement fo rimproved quality of life.     Baseline  11/28: 14    Time  8    Period  Weeks    Status  New    Target Date  01/06/18      PT LONG TERM GOAL #4   Title  Patient will tolerate sitting unsupported demonstrating erect sitting posture with minimal thoracic kyphosis for 15+ minutes with maximum of 5/10 back pain to demonstrate improved back extensor strength and improved sitting tolerance.     Baseline  excessive thoracic kyphosis    Time  8    Period  Weeks    Status  New  Target Date  01/06/18            Plan - 12/14/17 1652    Clinical Impression Statement   Patient has decreased mobility in thoracic and lumbar spine that improved after manual  tx. Scapular control improved after STM to subscap and deep tissue massage to release trigger points. Patient pain decreased to 1/ 10 after manual. Patient will continue to benefit from skilled physical therapy to decrease pain, improve trunk mobility to increase patient's quality of life    Rehab Potential  Good    Clinical Impairments Affecting Rehab Potential  (-) HTN, reflux, bilateral TKA, diabetes, history of chronic back pain,recent move (+) good family support and understanding of medical field    PT Frequency  2x / week    PT Duration  8 weeks    PT Treatment/Interventions  ADLs/Self Care Home Management;Aquatic Therapy;Cryotherapy;Electrical Stimulation;Iontophoresis 4mg /ml Dexamethasone;Moist Heat;Traction;Ultrasound;Balance training;Therapeutic exercise;Therapeutic activities;Functional mobility training;Stair training;Gait training;Neuromuscular re-education;Patient/family education;Manual techniques;Passive range of motion;Dry needling;Energy conservation;Taping    PT Next Visit Plan  review HEP, mobilizations of thoracic region     PT Home Exercise Plan  see sheet    Consulted and Agree with Plan of Care  Patient       Patient will benefit from skilled therapeutic intervention in order to improve the following deficits and impairments:  Abnormal gait, Decreased activity tolerance, Decreased balance, Decreased endurance, Decreased mobility, Decreased range of motion, Difficulty walking, Decreased strength, Hypomobility, Impaired flexibility, Impaired perceived functional ability, Increased muscle spasms, Postural dysfunction, Pain, Increased fascial restricitons, Improper body mechanics  Visit Diagnosis: Chronic bilateral low back pain without sciatica  Decreased range of motion of lumbar spine  Other abnormalities of gait and mobility     Problem List Patient Active Problem List   Diagnosis Date Noted  . 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 Essential hypertension 09/09/2017   . Type 2 diabetes mellitus ([place]) 09/09/2017  . GERD (gastroesophageal reflux disease) 09/09/2017  . Testosterone deficiency 09/09/2017  . OSA (obstructive sleep apnea) 09/09/2017  . Chronic bilateral low back pain without sciatica 09/09/2017  . H/O knee surgery 04/27/2014  . 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 [place], [place], [address] Phone: [phone]   Fax:  [phone]  Name: Alic Hilburn MRN: [id number] Date of Birth: 1951/07/08

## 2017-12-16 ENCOUNTER — Ambulatory Visit: Payer: Medicare Other | Attending: Primary Care

## 2017-12-16 DIAGNOSIS — R2689 Other abnormalities of gait and mobility: Secondary | ICD-10-CM | POA: Insufficient documentation

## 2017-12-16 DIAGNOSIS — M545 Low back pain: Secondary | ICD-10-CM | POA: Insufficient documentation

## 2017-12-16 DIAGNOSIS — G8929 Other chronic pain: Secondary | ICD-10-CM | POA: Insufficient documentation

## 2017-12-16 DIAGNOSIS — M5386 Other specified dorsopathies, lumbar region: Secondary | ICD-10-CM | POA: Diagnosis not present

## 2017-12-16 NOTE — Therapy (Signed)
[place] MAIN [facility] SERVICES [address] [place], [place], [address] Phone: [phone]   Fax:  [phone]  Physical Therapy Treatment  Patient Details  Name: Adam Wise MRN: [id number] Date of Birth: 10-12-1951 Referring Provider: Alma Friendly   Encounter Date: 12/16/2017  PT End of Session - 12/16/17 1648    Visit Number  8    Number of Visits  16    Date for PT Re-Evaluation  01/06/18    Authorization - Visit Number  8    Authorization - Number of Visits  10    PT Start Time  1600    PT Stop Time  0347    PT Time Calculation (min)  44 min    Activity Tolerance  Patient tolerated treatment well;Patient limited by pain    Behavior During Therapy  [facility] for tasks assessed/performed       Past Medical History:  Diagnosis Date  . Chickenpox   . Chronic knee pain   . Chronic low back pain   . Essential hypertension   . GERD (gastroesophageal reflux disease)   . OSA (obstructive sleep apnea)   . Testosterone deficiency   . Type 2 diabetes mellitus ([place])     Past Surgical History:  Procedure Laterality Date  . REPLACEMENT TOTAL KNEE BILATERAL  2015  . TONSILLECTOMY  1960    There were no vitals filed for this visit.  Subjective Assessment - 12/16/17 1608    Subjective  Patient reports pain primarily being between shoulder blades today. Is 2/10 right now, started last night. Patient improved pain symptoms after last session.     Pertinent History  Patient is a pleasant 67 year old male who presents to physical therapy for mid to upper back pain. Had PT 6 months ago for his back in [place]. Reports having sciatica two years ago in 2016, went away with acupuncture. Right IT band has been tight ever since 2016. Pain occurs across upper lumbar and will spread to between shoulder blades with occasional tingling in fingers when sleeping on back at night. Pt. Has a history of two knee replacements and was a [employer] cabin crew. He and his wife recently moved to [place] and a currently living with their daughter while searching for a home.     Limitations  Lifting;Standing;House hold activities;Other (comment)    How long can you sit comfortably?  doesn't affect    How long can you stand comfortably?  half hour    Diagnostic tests  x ray     Patient Stated Goals  get rid of pain    Currently in Pain?  Yes    Pain Score  2     Pain Location  Back    Pain Orientation  Upper;Mid    Pain Descriptors / Indicators  Aching;Discomfort    Pain Type  Chronic pain    Pain Onset  More than a month ago    Pain Frequency  Constant        Manual: Mobilizations grade I-IV CPAs and UPAs , decreased hypombility in lower thoracic region, continued hypomobility in upper thoracic. T 3-6 hypomobile Angled mobilizations with  Bilaterally hands upon superior curvature performed with patient stating pain relief.    Suboccipital release : 3x 30 seconds   Cervical SB with overpressure at shoulders supine stretch 2x 30, cervical rotation with overpressure at shoulders 2x 30 seconds    Upper thoracic and upper trap stretch of LUE and  RUE, all head positions 2x 20 seconds each head position each UE.    STM to thoracic paraspinals with focus upon scapular inferior angle.     TherEx Open prayer book stretch supine 2x each direction 30 seconds each, overpressure applied at hip and wrist 2x 20 seconds    Seated postural with scapular retractions 10x      Patient's pain decreased to 1/ 10 after manual tx.     Pt. response to medical necessity: Patient will continue to benefit from skilled physical therapy to decrease pain, improve trunk mobility to increase patient's quality of life                         PT Education - 12/16/17 1647    Education provided  Yes    Education Details  posture, manual tx,     Person(s) Educated  Patient    Methods  Explanation;Demonstration;Verbal cues     Comprehension  Verbalized understanding;Returned demonstration       PT Short Term Goals - 11/11/17 1714      PT SHORT TERM GOAL #1   Title  Patient will be independent in home exercise program to improve strength/mobility for better functional independence with ADLs.    Baseline  HEP given    Time  2    Period  Weeks    Status  New    Target Date  11/25/17      PT SHORT TERM GOAL #2   Title  Patient will report a worst pain of 4/10 on VAS in  thoracic back   to improve tolerance with ADLs and reduced symptoms with activities.     Baseline  worst 6/10    Time  2    Period  Weeks    Status  New    Target Date  11/25/17        PT Long Term Goals - 11/11/17 1716      PT LONG TERM GOAL #1   Title  Patient will report a worst pain of 1/10 on VAS in thoracic back  to improve tolerance with ADLs and reduced symptoms with activities.     Baseline  11/28: 6/10    Time  8    Period  Weeks    Status  New    Target Date  01/06/18      PT LONG TERM GOAL #2   Title  Patient will reduce modified Oswestry score to <20 as to demonstrate minimal disability with ADLs including improved sleeping tolerance, walking/sitting tolerance etc for better mobility with ADLs.     Baseline  11/28: 36%    Time  8    Period  Weeks    Status  New    Target Date  01/06/18      PT LONG TERM GOAL #3   Title  Patient will decrease FABQPA to <13 to demonstrate decreased disability and fear of movement fo rimproved quality of life.     Baseline  11/28: 14    Time  8    Period  Weeks    Status  New    Target Date  01/06/18      PT LONG TERM GOAL #4   Title  Patient will tolerate sitting unsupported demonstrating erect sitting posture with minimal thoracic kyphosis for 15+ minutes with maximum of 5/10 back pain to demonstrate improved back extensor strength and improved sitting tolerance.     Baseline  excessive thoracic  kyphosis    Time  8    Period  Weeks    Status  New    Target Date  01/06/18             Plan - 12/16/17 1648    Clinical Impression Statement  Patient presented with increased hypomobility between shoulder blades at T3-6 that improved with manual tx.  Mobilizations decreased pain as well as stretches. Patient posture improved after treatment with decreased noted FHRS. Patient will continue to benefit from skilled physical therapy to decrease pain, improve trunk mobility to increase patient's quality of life    Rehab Potential  Good    Clinical Impairments Affecting Rehab Potential  (-) HTN, reflux, bilateral TKA, diabetes, history of chronic back pain,recent move (+) good family support and understanding of medical field    PT Frequency  2x / week    PT Duration  8 weeks    PT Treatment/Interventions  ADLs/Self Care Home Management;Aquatic Therapy;Cryotherapy;Electrical Stimulation;Iontophoresis 4mg /ml Dexamethasone;Moist Heat;Traction;Ultrasound;Balance training;Therapeutic exercise;Therapeutic activities;Functional mobility training;Stair training;Gait training;Neuromuscular re-education;Patient/family education;Manual techniques;Passive range of motion;Dry needling;Energy conservation;Taping    PT Next Visit Plan  review HEP, mobilizations of thoracic region     PT Home Exercise Plan  see sheet    Consulted and Agree with Plan of Care  Patient       Patient will benefit from skilled therapeutic intervention in order to improve the following deficits and impairments:  Abnormal gait, Decreased activity tolerance, Decreased balance, Decreased endurance, Decreased mobility, Decreased range of motion, Difficulty walking, Decreased strength, Hypomobility, Impaired flexibility, Impaired perceived functional ability, Increased muscle spasms, Postural dysfunction, Pain, Increased fascial restricitons, Improper body mechanics  Visit Diagnosis: Chronic bilateral low back pain without sciatica  Decreased range of motion of lumbar spine  Other abnormalities of gait and  mobility     Problem List Patient Active Problem List   Diagnosis Date Noted  . 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 Essential hypertension 09/09/2017  . Type 2 diabetes mellitus ([place]) 09/09/2017  . GERD (gastroesophageal reflux disease) 09/09/2017  . Testosterone deficiency 09/09/2017  . OSA (obstructive sleep apnea) 09/09/2017  . Chronic bilateral low back pain without sciatica 09/09/2017  . H/O knee surgery 04/27/2014  . 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 [place], [place], [address] Phone: [phone]   Fax:  [phone]  Name: Adam Wise MRN: [id number] Date of Birth: 1951-03-19

## 2017-12-21 ENCOUNTER — Ambulatory Visit: Payer: Medicare Other

## 2017-12-21 DIAGNOSIS — R2689 Other abnormalities of gait and mobility: Secondary | ICD-10-CM | POA: Diagnosis not present

## 2017-12-21 DIAGNOSIS — M5386 Other specified dorsopathies, lumbar region: Secondary | ICD-10-CM | POA: Diagnosis not present

## 2017-12-21 DIAGNOSIS — M545 Low back pain, unspecified: Secondary | ICD-10-CM

## 2017-12-21 DIAGNOSIS — G8929 Other chronic pain: Secondary | ICD-10-CM

## 2017-12-21 NOTE — Therapy (Signed)
[place] MAIN [facility] SERVICES [address] [place], [place], [address] Phone: [phone]   Fax:  [phone]  Physical Therapy Treatment  Patient Details  Name: Adam Wise MRN: [id number] Date of Birth: 1951/12/09 Referring Provider: Alma Friendly   Encounter Date: 12/21/2017  PT End of Session - 12/21/17 1156    Visit Number  9    Number of Visits  16    Date for PT Re-Evaluation  01/06/18    Authorization - Visit Number  9    Authorization - Number of Visits  10    PT Start Time  0945    PT Stop Time  1030    PT Time Calculation (min)  45 min    Activity Tolerance  Patient tolerated treatment well;Patient limited by pain    Behavior During Therapy  [facility] for tasks assessed/performed       Past Medical History:  Diagnosis Date  . Chickenpox   . Chronic knee pain   . Chronic low back pain   . Essential hypertension   . GERD (gastroesophageal reflux disease)   . OSA (obstructive sleep apnea)   . Testosterone deficiency   . Type 2 diabetes mellitus ([place])     Past Surgical History:  Procedure Laterality Date  . REPLACEMENT TOTAL KNEE BILATERAL  2015  . TONSILLECTOMY  1960    There were no vitals filed for this visit.  Subjective Assessment - 12/21/17 0947    Subjective  Patient reports having no pain for multiple days in a row. Went on a 161 mile motorcyle ride yesterday, feeling well.     Pertinent History  Patient is a pleasant 67 year old male who presents to physical therapy for mid to upper back pain. Had PT 6 months ago for his back in [place]. Reports having sciatica two years ago in 2016, went away with acupuncture. Right IT band has been tight ever since 2016. Pain occurs across upper lumbar and will spread to between shoulder blades with occasional tingling in fingers when sleeping on back at night. 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     Limitations  Lifting;Standing;House hold activities;Other (comment)    How long can you sit comfortably?  doesn't affect    How long can you stand comfortably?  half hour    Diagnostic tests  x ray     Patient Stated Goals  get rid of pain    Currently in Pain?  No/denies       Manual: Mobilizations grade I-IV CPAs and UPAs , . Bilaterally hands upon superior curvature performed with patient stating pain relief.    Suboccipital release : 3x 30 seconds   Cervical SB with overpressure at shoulders supine stretch 2x 30, cervical rotation with overpressure at shoulders 2x 30 seconds   Subscap STM R scapula.       TherEx Open prayer book stretch supine 2x each direction 30 seconds each, overpressure applied at hip and wrist 2x 20 seconds    Seated postural with scapular retractions 10x    Supine abduction GTB 10x hands in neutral position   Supine ER GTB 10x neutral hand position   Seated row GTB 2x 10x , cues for not  Compensating with L shoulder, L shoulder hiking cued for correction.   Standing straight arm  row 2x 10x    Pt. response to medical necessity: Patient will continue to benefit from skilled physical therapy to decrease pain, improve trunk mobility to increase patient's quality of life                     PT Education - 12/21/17 1155    Education provided  Yes    Education Details  postural interventions, manual tx. positioning of body     Person(s) Educated  Patient    Methods  Explanation;Demonstration;Verbal cues    Comprehension  Verbalized understanding;Returned demonstration       PT Short Term Goals - 11/11/17 1714      PT SHORT TERM GOAL #1   Title  Patient will be independent in home exercise program to improve strength/mobility for better functional independence with ADLs.    Baseline  HEP given    Time  2    Period  Weeks    Status  New     Target Date  11/25/17      PT SHORT TERM GOAL #2   Title  Patient will report a worst pain of 4/10 on VAS in  thoracic back   to improve tolerance with ADLs and reduced symptoms with activities.     Baseline  worst 6/10    Time  2    Period  Weeks    Status  New    Target Date  11/25/17        PT Long Term Goals - 11/11/17 1716      PT LONG TERM GOAL #1   Title  Patient will report a worst pain of 1/10 on VAS in thoracic back  to improve tolerance with ADLs and reduced symptoms with activities.     Baseline  11/28: 6/10    Time  8    Period  Weeks    Status  New    Target Date  01/06/18      PT LONG TERM GOAL #2   Title  Patient will reduce modified Oswestry score to <20 as to demonstrate minimal disability with ADLs including improved sleeping tolerance, walking/sitting tolerance etc for better mobility with ADLs.     Baseline  11/28: 36%    Time  8    Period  Weeks    Status  New    Target Date  01/06/18      PT LONG TERM GOAL #3   Title  Patient will decrease FABQPA to <13 to demonstrate decreased disability and fear of movement fo rimproved quality of life.     Baseline  11/28: 14    Time  8    Period  Weeks    Status  New    Target Date  01/06/18      PT LONG TERM GOAL #4   Title  Patient will tolerate sitting unsupported demonstrating erect sitting posture with minimal thoracic kyphosis for 15+ minutes with maximum of 5/10 back pain to demonstrate improved back extensor strength and improved sitting tolerance.     Baseline  excessive thoracic kyphosis    Time  8    Period  Weeks    Status  New    Target Date  01/06/18            Plan - 12/21/17 1159    Clinical Impression Statement  Patient presents with improved spinal mobility and posture. Occasional cueing required for L shoulder hike due to scapular positioning and postural weakness. Patient  will continue to benefit from skilled physical therapy to decrease pain, improve trunk mobility to increase  patient's quality of life    Rehab Potential  Good    Clinical Impairments Affecting Rehab Potential  (-) HTN, reflux, bilateral TKA, diabetes, history of chronic back pain,recent move (+) good family support and understanding of medical field    PT Frequency  2x / week    PT Duration  8 weeks    PT Treatment/Interventions  ADLs/Self Care Home Management;Aquatic Therapy;Cryotherapy;Electrical Stimulation;Iontophoresis 4mg /ml Dexamethasone;Moist Heat;Traction;Ultrasound;Balance training;Therapeutic exercise;Therapeutic activities;Functional mobility training;Stair training;Gait training;Neuromuscular re-education;Patient/family education;Manual techniques;Passive range of motion;Dry needling;Energy conservation;Taping    PT Next Visit Plan  review HEP, mobilizations of thoracic region     PT Home Exercise Plan  see sheet    Consulted and Agree with Plan of Care  Patient       Patient will benefit from skilled therapeutic intervention in order to improve the following deficits and impairments:  Abnormal gait, Decreased activity tolerance, Decreased balance, Decreased endurance, Decreased mobility, Decreased range of motion, Difficulty walking, Decreased strength, Hypomobility, Impaired flexibility, Impaired perceived functional ability, Increased muscle spasms, Postural dysfunction, Pain, Increased fascial restricitons, Improper body mechanics  Visit Diagnosis: Chronic bilateral low back pain without sciatica  Decreased range of motion of lumbar spine  Other abnormalities of gait and mobility     Problem List Patient Active Problem List   Diagnosis Date Noted  . 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 Essential hypertension 09/09/2017  . Type 2 diabetes mellitus ([place]) 09/09/2017  . GERD (gastroesophageal reflux disease) 09/09/2017  . Testosterone deficiency 09/09/2017  . OSA (obstructive sleep apnea) 09/09/2017  . Chronic bilateral low back pain without sciatica 09/09/2017  . H/O knee  surgery 04/27/2014  . 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 [place], [place], [address] Phone: [phone]   Fax:  [phone]  Name: Jaques Mineer MRN: [id number] Date of Birth: 1951-08-09

## 2017-12-28 ENCOUNTER — Ambulatory Visit: Payer: Medicare Other

## 2017-12-28 DIAGNOSIS — G8929 Other chronic pain: Secondary | ICD-10-CM | POA: Diagnosis not present

## 2017-12-28 DIAGNOSIS — R2689 Other abnormalities of gait and mobility: Secondary | ICD-10-CM

## 2017-12-28 DIAGNOSIS — M545 Low back pain, unspecified: Secondary | ICD-10-CM

## 2017-12-28 DIAGNOSIS — M5386 Other specified dorsopathies, lumbar region: Secondary | ICD-10-CM

## 2017-12-28 NOTE — Therapy (Signed)
[place] MAIN [facility] SERVICES [address] [place], [place], [address] Phone: [phone]   Fax:  [phone]  Physical Therapy Treatment  Patient Details  Name: Adam Wise MRN: [id number] Date of Birth: 01-17-1951 Referring Provider: Alma Friendly   Encounter Date: 12/28/2017  PT End of Session - 12/28/17 1141    Visit Number  10    Number of Visits  16    Date for PT Re-Evaluation  01/06/18    Authorization - Visit Number  9    Authorization - Number of Visits  10    PT Start Time  0950    PT Stop Time  1030    PT Time Calculation (min)  40 min    Activity Tolerance  Patient tolerated treatment well;Patient limited by pain    Behavior During Therapy  [facility] for tasks assessed/performed       Past Medical History:  Diagnosis Date  . Chickenpox   . Chronic knee pain   . Chronic low back pain   . Essential hypertension   . GERD (gastroesophageal reflux disease)   . OSA (obstructive sleep apnea)   . Testosterone deficiency   . Type 2 diabetes mellitus ([place])     Past Surgical History:  Procedure Laterality Date  . REPLACEMENT TOTAL KNEE BILATERAL  2015  . TONSILLECTOMY  1960    There were no vitals filed for this visit.  Subjective Assessment - 12/28/17 1029    Subjective  Patient reports having slight increase in pain since last visit, felt a pop then release of pain.     Pertinent History  Patient is a pleasant 67 year old male who presents to physical therapy for mid to upper back pain. Had PT 6 months ago for his back in [place]. Reports having sciatica two years ago in 2016, went away with acupuncture. Right IT band has been tight ever since 2016. Pain occurs across upper lumbar and will spread to between shoulder blades with occasional tingling in fingers when sleeping on back at night. Pt. 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     Limitations  Lifting;Standing;House hold activities;Other (comment)    How long can you sit comfortably?  doesn't affect    How long can you stand comfortably?  half hour    Diagnostic tests  x ray     Patient Stated Goals  get rid of pain    Currently in Pain?  No/denies       Manual: Mobilizations grade I-IV CPAs and UPAs thoracolumbar, grade I-II cervicothoracic with increased hypomobility on L.  , . Bilaterally hands upon superior curvature performed with patient stating pain relief.    Suboccipital release : 3x 30 seconds   Cervical SB with overpressure at shoulders supine stretch 2x 30, cervical rotation with overpressure at shoulders 2x 30 seconds    Subscap STM R scapula.        TherEx Open prayer book stretch supine 2x each direction 30 seconds each, overpressure applied at hip and wrist 2x 20 seconds    Seated postural with scapular retractions 10x    Supine abduction GTB 10x hands in neutral position        Pt. response to medical necessity: Patient will continue to benefit from skilled physical therapy to decrease pain, improve trunk mobility to  increase patient's quality of life                        PT Education - 12/28/17 1030    Education provided  Yes    Education Details  posture, manual tx, positioning of body when watching tv    Person(s) Educated  Patient    Methods  Explanation;Demonstration;Verbal cues    Comprehension  Verbalized understanding;Returned demonstration       PT Short Term Goals - 11/11/17 1714      PT SHORT TERM GOAL #1   Title  Patient will be independent in home exercise program to improve strength/mobility for better functional independence with ADLs.    Baseline  HEP given    Time  2    Period  Weeks    Status  New    Target Date  11/25/17      PT SHORT TERM GOAL #2   Title  Patient will report a worst pain of 4/10 on VAS in   thoracic back   to improve tolerance with ADLs and reduced symptoms with activities.     Baseline  worst 6/10    Time  2    Period  Weeks    Status  New    Target Date  11/25/17        PT Long Term Goals - 11/11/17 1716      PT LONG TERM GOAL #1   Title  Patient will report a worst pain of 1/10 on VAS in thoracic back  to improve tolerance with ADLs and reduced symptoms with activities.     Baseline  11/28: 6/10    Time  8    Period  Weeks    Status  New    Target Date  01/06/18      PT LONG TERM GOAL #2   Title  Patient will reduce modified Oswestry score to <20 as to demonstrate minimal disability with ADLs including improved sleeping tolerance, walking/sitting tolerance etc for better mobility with ADLs.     Baseline  11/28: 36%    Time  8    Period  Weeks    Status  New    Target Date  01/06/18      PT LONG TERM GOAL #3   Title  Patient will decrease FABQPA to <13 to demonstrate decreased disability and fear of movement fo rimproved quality of life.     Baseline  11/28: 14    Time  8    Period  Weeks    Status  New    Target Date  01/06/18      PT LONG TERM GOAL #4   Title  Patient will tolerate sitting unsupported demonstrating erect sitting posture with minimal thoracic kyphosis for 15+ minutes with maximum of 5/10 back pain to demonstrate improved back extensor strength and improved sitting tolerance.     Baseline  excessive thoracic kyphosis    Time  8    Period  Weeks    Status  New    Target Date  01/06/18            Plan - 12/28/17 1148    Clinical Impression Statement  Patient presents with increased hypomobility of L cervicothoracic region that improved with manual tx, due to patient change in living room furniture position. Patient treatment limited due to patient being late. Decreased thoracic kyphosis noted upon end of session with improved FHRS positioning and decreased upper  cross syndrome positioning. Patient will continue to benefit from skilled  physical therapy to decrease pain, improve trunk mobility to increase patient's quality of life    Rehab Potential  Good    Clinical Impairments Affecting Rehab Potential  (-) HTN, reflux, bilateral TKA, diabetes, history of chronic back pain,recent move (+) good family support and understanding of medical field    PT Frequency  2x / week    PT Duration  8 weeks    PT Treatment/Interventions  ADLs/Self Care Home Management;Aquatic Therapy;Cryotherapy;Electrical Stimulation;Iontophoresis 4mg /ml Dexamethasone;Moist Heat;Traction;Ultrasound;Balance training;Therapeutic exercise;Therapeutic activities;Functional mobility training;Stair training;Gait training;Neuromuscular re-education;Patient/family education;Manual techniques;Passive range of motion;Dry needling;Energy conservation;Taping    PT Next Visit Plan  review HEP, mobilizations of thoracic region     PT Home Exercise Plan  see sheet    Consulted and Agree with Plan of Care  Patient       Patient will benefit from skilled therapeutic intervention in order to improve the following deficits and impairments:  Abnormal gait, Decreased activity tolerance, Decreased balance, Decreased endurance, Decreased mobility, Decreased range of motion, Difficulty walking, Decreased strength, Hypomobility, Impaired flexibility, Impaired perceived functional ability, Increased muscle spasms, Postural dysfunction, Pain, Increased fascial restricitons, Improper body mechanics  Visit Diagnosis: Chronic bilateral low back pain without sciatica  Decreased range of motion of lumbar spine  Other abnormalities of gait and mobility     Problem List Patient Active Problem List   Diagnosis Date Noted  . 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 Essential hypertension 09/09/2017  . Type 2 diabetes mellitus ([place]) 09/09/2017  . GERD (gastroesophageal reflux disease) 09/09/2017  . Testosterone deficiency 09/09/2017  . OSA (obstructive sleep apnea) 09/09/2017  . Chronic  bilateral low back pain without sciatica 09/09/2017  . H/O knee surgery 04/27/2014  . 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

## 2017-12-30 ENCOUNTER — Ambulatory Visit: Payer: Medicare Other

## 2017-12-30 ENCOUNTER — Other Ambulatory Visit: Payer: Self-pay | Admitting: Primary Care

## 2017-12-30 DIAGNOSIS — R2689 Other abnormalities of gait and mobility: Secondary | ICD-10-CM

## 2017-12-30 DIAGNOSIS — M545 Low back pain: Secondary | ICD-10-CM | POA: Diagnosis not present

## 2017-12-30 DIAGNOSIS — M5386 Other specified dorsopathies, lumbar region: Secondary | ICD-10-CM

## 2017-12-30 DIAGNOSIS — G8929 Other chronic pain: Secondary | ICD-10-CM | POA: Diagnosis not present

## 2017-12-30 DIAGNOSIS — E349 Endocrine disorder, unspecified: Secondary | ICD-10-CM

## 2017-12-30 DIAGNOSIS — E230 Hypopituitarism: Secondary | ICD-10-CM

## 2017-12-30 NOTE — Therapy (Signed)
[place] MAIN [facility] SERVICES [address] [place], [place], [address] Phone: [phone]   Fax:  [phone]  Physical Therapy Treatment  Patient Details  Name: Adam Wise MRN: [id number] Date of Birth: [birth year] Referring Provider: Alma Friendly   Encounter Date: 12/30/2017  PT End of Session - 12/30/17 0955    Visit Number  11    Number of Visits  16    Date for PT Re-Evaluation  01/06/18    PT Start Time  0858    PT Stop Time  0945    PT Time Calculation (min)  47 min    Activity Tolerance  Patient tolerated treatment well;Patient limited by pain    Behavior During Therapy  [facility] for tasks assessed/performed       Past Medical History:  Diagnosis Date  . Chickenpox   . Chronic knee pain   . Chronic low back pain   . Essential hypertension   . GERD (gastroesophageal reflux disease)   . OSA (obstructive sleep apnea)   . Testosterone deficiency   . Type 2 diabetes mellitus ([place])     Past Surgical History:  Procedure Laterality Date  . REPLACEMENT TOTAL KNEE BILATERAL  2015  . TONSILLECTOMY  1960    There were no vitals filed for this visit.  Subjective Assessment - 12/30/17 0859    Subjective  Patient feeling very stiff with some pain from ankles to neck due to overwearing new orthotics. Did not sleep well.     Pertinent History  Patient is a pleasant 67 year old male who presents to physical therapy for mid to upper back pain. Had PT 6 months ago for his back in [place]. Reports having sciatica two years ago in 2016, went away with acupuncture. Right IT band has been tight ever since 2016. Pain occurs across upper lumbar and will spread to between shoulder blades with occasional tingling in fingers when sleeping on back at night. 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     Limitations  Lifting;Standing;House hold activities;Other (comment)    How long can you sit comfortably?  doesn't affect    How long can you stand comfortably?  half hour    Diagnostic tests  x ray     Patient Stated Goals  get rid of pain    Currently in Pain?  Yes    Pain Score  1     Pain Location  Back    Pain Orientation  Upper;Mid;Lower    Pain Descriptors / Indicators  Aching    Pain Type  Chronic pain       Manual: Mobilizations grade I-IV CPAs and UPAs thoracolumbar, grade I-II cervicothoracic with increased hypomobility on L.  , . Bilaterally hands upon superior curvature performed with patient stating pain relief.   Hamstring stretch (BLE) 2x 60 seconds Pectoral fascia/fiber release // to lines of fiber to allow for stretch. (pec minor and major)    Suboccipital release : 3x 30 seconds   LE rotation with overpressure at PSIS and shoulder (open book stretch) L and R 2x 30 seconds  Cable machine: tactile cueing.  Single arm rows cable machine 2.5 # 1x 12x, cues for keeping elbow in and shoulder down , 7.5# 1x10 Straight arm lat pull down cable machine 2.5# 12, cues for keeping  shoulders back; 7.5 # 12x.  Bent over row 7.5# 2x12    pec stretch doorway 3x 30 seconds , slight decreased angle  Wall posture 2x 10 seconds Robber stretch supine 2x 30 seconds Scapular retractions in front of mirror with PT providing tactile cueing between shoulder blades     Pt. response to medical necessity: Patient will continue to benefit from skilled physical therapy to decrease pain, improve trunk mobility to increase patient's quality of life.    Pain decreased to 0/10 after tx.        PT Education - 12/30/17 0955    Education provided  Yes    Education Details  posture, manual tx, positioning of body for proper bodymechanics.     Person(s) Educated  Patient    Methods  Explanation;Demonstration;Verbal cues    Comprehension  Verbalized understanding;Returned  demonstration       PT Short Term Goals - 11/11/17 1714      PT SHORT TERM GOAL #1   Title  Patient will be independent in home exercise program to improve strength/mobility for better functional independence with ADLs.    Baseline  HEP given    Time  2    Period  Weeks    Status  New    Target Date  11/25/17      PT SHORT TERM GOAL #2   Title  Patient will report a worst pain of 4/10 on VAS in  thoracic back   to improve tolerance with ADLs and reduced symptoms with activities.     Baseline  worst 6/10    Time  2    Period  Weeks    Status  New    Target Date  11/25/17        PT Long Term Goals - 11/11/17 1716      PT LONG TERM GOAL #1   Title  Patient will report a worst pain of 1/10 on VAS in thoracic back  to improve tolerance with ADLs and reduced symptoms with activities.     Baseline  11/28: 6/10    Time  8    Period  Weeks    Status  New    Target Date  01/06/18      PT LONG TERM GOAL #2   Title  Patient will reduce modified Oswestry score to <20 as to demonstrate minimal disability with ADLs including improved sleeping tolerance, walking/sitting tolerance etc for better mobility with ADLs.     Baseline  11/28: 36%    Time  8    Period  Weeks    Status  New    Target Date  01/06/18      PT LONG TERM GOAL #3   Title  Patient will decrease FABQPA to <13 to demonstrate decreased disability and fear of movement fo rimproved quality of life.     Baseline  11/28: 14    Time  8    Period  Weeks    Status  New    Target Date  01/06/18      PT LONG TERM GOAL #4   Title  Patient will tolerate sitting unsupported demonstrating erect sitting posture with minimal thoracic kyphosis for 15+ minutes with maximum of 5/10 back pain to demonstrate improved back extensor strength and improved sitting tolerance.     Baseline  excessive thoracic kyphosis    Time  8    Period  Weeks    Status  New    Target Date  01/06/18            Plan - 12/30/17 0959    Clinical  Impression Statement  Patient presented with increased stiffness due to over-wearing orthotics in shoes. Patient shoulders bilaterally limit positioning for pectoral stretches requiring manual pectoral stretching of fibers of pec minor and major.  Strengthening of postural muscles performed with patient requiring tactile cueing to avoid shoulder compensatory mechanisms. Pt. Pain decreased to 0/10 after treatment. Patient will continue to benefit from skilled physical therapy to decrease pain, improve trunk mobility to increase patient's quality of life.    Rehab Potential  Good    Clinical Impairments Affecting Rehab Potential  (-) HTN, reflux, bilateral TKA, diabetes, history of chronic back pain,recent move (+) good family support and understanding of medical field    PT Frequency  2x / week    PT Duration  8 weeks    PT Treatment/Interventions  ADLs/Self Care Home Management;Aquatic Therapy;Cryotherapy;Electrical Stimulation;Iontophoresis 4mg /ml Dexamethasone;Moist Heat;Traction;Ultrasound;Balance training;Therapeutic exercise;Therapeutic activities;Functional mobility training;Stair training;Gait training;Neuromuscular re-education;Patient/family education;Manual techniques;Passive range of motion;Dry needling;Energy conservation;Taping    PT Next Visit Plan  review HEP, mobilizations of thoracic region     PT Home Exercise Plan  see sheet    Consulted and Agree with Plan of Care  Patient       Patient will benefit from skilled therapeutic intervention in order to improve the following deficits and impairments:  Abnormal gait, Decreased activity tolerance, Decreased balance, Decreased endurance, Decreased mobility, Decreased range of motion, Difficulty walking, Decreased strength, Hypomobility, Impaired flexibility, Impaired perceived functional ability, Increased muscle spasms, Postural dysfunction, Pain, Increased fascial restricitons, Improper body mechanics  Visit Diagnosis: Chronic bilateral  low back pain without sciatica  Decreased range of motion of lumbar spine  Other abnormalities of gait and mobility     Problem List Patient Active Problem List   Diagnosis Date Noted  . 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 Essential hypertension 09/09/2017  . Type 2 diabetes mellitus ([place]) 09/09/2017  . GERD (gastroesophageal reflux disease) 09/09/2017  . Testosterone deficiency 09/09/2017  . OSA (obstructive sleep apnea) 09/09/2017  . Chronic bilateral low back pain without sciatica 09/09/2017  . H/O knee surgery 04/27/2014  . 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 [place], [place], [address] Phone: [phone]   Fax:  [phone]  Name: Adam Wise MRN: [id number] Date of Birth: 08-Aug-1951

## 2018-01-05 ENCOUNTER — Ambulatory Visit: Payer: Medicare Other

## 2018-01-05 DIAGNOSIS — M5386 Other specified dorsopathies, lumbar region: Secondary | ICD-10-CM | POA: Diagnosis not present

## 2018-01-05 DIAGNOSIS — G8929 Other chronic pain: Secondary | ICD-10-CM

## 2018-01-05 DIAGNOSIS — M545 Low back pain: Secondary | ICD-10-CM | POA: Diagnosis not present

## 2018-01-05 DIAGNOSIS — R2689 Other abnormalities of gait and mobility: Secondary | ICD-10-CM

## 2018-01-05 NOTE — Therapy (Signed)
[place] MAIN [facility] SERVICES [address] [place], [place], [address] Phone: [phone]   Fax:  [phone]  Physical Therapy Treatment  Patient Details  Name: Adam Wise MRN: [id number] Date of Birth: 01/07/1951 Referring Provider: Alma Friendly   Encounter Date: 01/05/2018  PT End of Session - 01/05/18 1636    Visit Number  12    Number of Visits  16    Date for PT Re-Evaluation  01/06/18    PT Start Time  5732    PT Stop Time  1630    PT Time Calculation (min)  45 min    Activity Tolerance  Patient tolerated treatment well;Patient limited by pain    Behavior During Therapy  [facility] for tasks assessed/performed       Past Medical History:  Diagnosis Date  . Chickenpox   . Chronic knee pain   . Chronic low back pain   . Essential hypertension   . GERD (gastroesophageal reflux disease)   . OSA (obstructive sleep apnea)   . Testosterone deficiency   . Type 2 diabetes mellitus ([place])     Past Surgical History:  Procedure Laterality Date  . REPLACEMENT TOTAL KNEE BILATERAL  2015  . TONSILLECTOMY  1960    There were no vitals filed for this visit.  Subjective Assessment - 01/05/18 1549    Subjective  Patient reports no pain today, been feeling better with VAS of worst pain in past week 1/10 .     Pertinent History  Patient is a pleasant 67 year old male who presents to physical therapy for mid to upper back pain. Had PT 6 months ago for his back in [place]. Reports having sciatica two years ago in 2016, went away with acupuncture. Right IT band has been tight ever since 2016. Pain occurs across upper lumbar and will spread to between shoulder blades with occasional tingling in fingers when sleeping on back at night. 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     Limitations   Lifting;Standing;House hold activities;Other (comment)    How long can you sit comfortably?  doesn't affect    How long can you stand comfortably?  half hour    Diagnostic tests  x ray     Patient Stated Goals  get rid of pain    Currently in Pain?  No/denies         VAS: 1/10 worst  MODI: 6% FABQPA: 8 Seated posture   Manual: Mobilizations grade I-IV CPAs and UPAs thoracolumbar, grade I-IV cervicothoracic . Bilaterally hands upon superior curvature performed with patient stating pain relief.    Hamstring stretch (BLE) 2x 60 seconds   Suboccipital release : 3x 30 seconds  Education on HEP:  Straight arm row GTB 2x15 Bend arm row GTB 2x 15  Straight arm row GTB 2x12 each arm (BUE)     Scapular retractions in front of mirror with PT providing tactile cueing between shoulder blades                         PT Education - 01/05/18 1549    Education provided  Yes    Education Details  posture, manual tx, body mechanics ; D/C    Person(s) Educated  Patient    Methods  Explanation;Demonstration;Verbal cues  Comprehension  Verbalized understanding;Returned demonstration       PT Short Term Goals - 01/05/18 1550      PT SHORT TERM GOAL #1   Title  Patient will be independent in home exercise program to improve strength/mobility for better functional independence with ADLs.    Baseline  HEP compliant    Time  2    Period  Weeks    Status  Partially Met      PT SHORT TERM GOAL #2   Title  Patient will report a worst pain of 4/10 on VAS in  thoracic back   to improve tolerance with ADLs and reduced symptoms with activities.     Baseline  worst 6/10; 1/22: 1/10     Time  2    Period  Weeks    Status  Achieved        PT Long Term Goals - 01/05/18 1550      PT LONG TERM GOAL #1   Title  Patient will report a worst pain of 1/10 on VAS in thoracic back  to improve tolerance with ADLs and reduced symptoms with activities.     Baseline  11/28: 6/10  1/22: 1/10    Time  8    Period  Weeks    Status  Achieved      PT LONG TERM GOAL #2   Title  Patient will reduce modified Oswestry score to <20 as to demonstrate minimal disability with ADLs including improved sleeping tolerance, walking/sitting tolerance etc for better mobility with ADLs.     Baseline  11/28: 36% 1/22: 6%    Time  8    Period  Weeks    Status  Achieved      PT LONG TERM GOAL #3   Title  Patient will decrease FABQPA to <13 to demonstrate decreased disability and fear of movement fo rimproved quality of life.     Baseline  11/28: 14 1/22: 8    Time  8    Period  Weeks    Status  Achieved      PT LONG TERM GOAL #4   Title  Patient will tolerate sitting unsupported demonstrating erect sitting posture with minimal thoracic kyphosis for 15+ minutes with maximum of 5/10 back pain to demonstrate improved back extensor strength and improved sitting tolerance.     Baseline  excessive thoracic kyphosis, 0/10 pain with upright posture     Time  8    Period  Weeks    Status  Achieved            Plan - 01/05/18 1636    Clinical Impression Statement  Patient met all goals and is pain free at this time. FABQPA: 8, FABQ: 33/96, MODI:  6%, Will progress to independent home program at this time. Will be happy to see patient again in the future as needed.     Rehab Potential  Good    Clinical Impairments Affecting Rehab Potential  (-) HTN, reflux, bilateral TKA, diabetes, history of chronic back pain,recent move (+) good family support and understanding of medical field    PT Frequency  2x / week    PT Duration  8 weeks    PT Treatment/Interventions  ADLs/Self Care Home Management;Aquatic Therapy;Cryotherapy;Electrical Stimulation;Iontophoresis '4mg'$ /ml Dexamethasone;Moist Heat;Traction;Ultrasound;Balance training;Therapeutic exercise;Therapeutic activities;Functional mobility training;Stair training;Gait training;Neuromuscular re-education;Patient/family education;Manual  techniques;Passive range of motion;Dry needling;Energy conservation;Taping    PT Home Exercise Plan  see sheet    Consulted and Agree with Plan of  Care  Patient       Patient will benefit from skilled therapeutic intervention in order to improve the following deficits and impairments:  Abnormal gait, Decreased activity tolerance, Decreased balance, Decreased endurance, Decreased mobility, Decreased range of motion, Difficulty walking, Decreased strength, Hypomobility, Impaired flexibility, Impaired perceived functional ability, Increased muscle spasms, Postural dysfunction, Pain, Increased fascial restricitons, Improper body mechanics  Visit Diagnosis: Chronic bilateral low back pain without sciatica  Decreased range of motion of lumbar spine  Other abnormalities of gait and mobility     Problem List Patient Active Problem List   Diagnosis Date Noted  . 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 Essential hypertension 09/09/2017  . Type 2 diabetes mellitus ([place]) 09/09/2017  . GERD (gastroesophageal reflux disease) 09/09/2017  . Testosterone deficiency 09/09/2017  . OSA (obstructive sleep apnea) 09/09/2017  . Chronic bilateral low back pain without sciatica 09/09/2017  . H/O knee surgery 04/27/2014  . 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 [place], [place], [address] Phone: [phone]   Fax:  [phone]  Name: Adam Wise MRN: [id number] Date of Birth: 25-Sep-1951

## 2018-01-13 ENCOUNTER — Other Ambulatory Visit (INDEPENDENT_AMBULATORY_CARE_PROVIDER_SITE_OTHER): Payer: Medicare Other

## 2018-01-13 DIAGNOSIS — E119 Type 2 diabetes mellitus without complications: Secondary | ICD-10-CM

## 2018-01-13 LAB — LIPID PANEL
Cholesterol: 163 mg/dL (ref 0–200)
HDL: 33.7 mg/dL — ABNORMAL LOW (ref >39.00)
NonHDL: 129.61
Total CHOL/HDL Ratio: 5
Triglycerides: 319 mg/dL — ABNORMAL HIGH (ref 0.0–149.0)
VLDL: 63.8 mg/dL — ABNORMAL HIGH (ref 0.0–40.0)

## 2018-01-13 LAB — HEMOGLOBIN A1C: HEMOGLOBIN A1C: 7.9 % — AB (ref 4.6–6.5)

## 2018-01-13 LAB — LDL CHOLESTEROL, DIRECT: LDL DIRECT: 100 mg/dL

## 2018-01-15 DIAGNOSIS — J449 Chronic obstructive pulmonary disease, unspecified: Secondary | ICD-10-CM | POA: Diagnosis not present

## 2018-01-15 DIAGNOSIS — G4733 Obstructive sleep apnea (adult) (pediatric): Secondary | ICD-10-CM | POA: Diagnosis not present

## 2018-01-21 ENCOUNTER — Ambulatory Visit: Payer: Medicare Other

## 2018-03-08 DIAGNOSIS — R7989 Other specified abnormal findings of blood chemistry: Secondary | ICD-10-CM | POA: Diagnosis not present

## 2018-03-08 DIAGNOSIS — E236 Other disorders of pituitary gland: Secondary | ICD-10-CM | POA: Diagnosis not present

## 2018-03-10 ENCOUNTER — Other Ambulatory Visit: Payer: Self-pay | Admitting: "Endocrinology

## 2018-03-10 DIAGNOSIS — E236 Other disorders of pituitary gland: Secondary | ICD-10-CM

## 2018-03-10 DIAGNOSIS — R7989 Other specified abnormal findings of blood chemistry: Secondary | ICD-10-CM

## 2018-03-12 ENCOUNTER — Encounter: Payer: Self-pay | Admitting: Primary Care

## 2018-03-12 ENCOUNTER — Ambulatory Visit (INDEPENDENT_AMBULATORY_CARE_PROVIDER_SITE_OTHER): Payer: Medicare Other | Admitting: Primary Care

## 2018-03-12 VITALS — BP 128/76 | HR 87 | Temp 98.1°F | Ht 69.0 in | Wt 295.0 lb

## 2018-03-12 DIAGNOSIS — E349 Endocrine disorder, unspecified: Secondary | ICD-10-CM | POA: Diagnosis not present

## 2018-03-12 DIAGNOSIS — E230 Hypopituitarism: Secondary | ICD-10-CM

## 2018-03-12 DIAGNOSIS — E118 Type 2 diabetes mellitus with unspecified complications: Secondary | ICD-10-CM | POA: Diagnosis not present

## 2018-03-12 DIAGNOSIS — I10 Essential (primary) hypertension: Secondary | ICD-10-CM

## 2018-03-12 NOTE — Assessment & Plan Note (Signed)
Due for repeat A1C next month.  Long discussion about diet and need for weight loss through both diet and exercise.   Foot exam today unremarkable.  Pneumonia vaccinations UTD. Declines statins despite recommendations and given ASCVD risk score of 32%. He will work on weight loss. Eye exam UTD.  Discussed potential need for insulin vs other injectable if A1C above goal as he is already maxed out on three oral medications.   A1C in 1 month. Follow up in 6 months.

## 2018-03-12 NOTE — Assessment & Plan Note (Signed)
Stable on Amlodipine 10 mg and lisinopril 5 mg. Continue same.

## 2018-03-12 NOTE — Patient Instructions (Addendum)
Start exercising. You should be getting 150 minutes of moderate intensity exercise weekly.  Reduce consumption of milk, sweet tea, crackers, restaurant food. Increase vegetables, fruit, whole grains, water, lean protein.  Ensure you are consuming 64 ounces of water daily.  Schedule a lab only appointment to return after April 30th for repeat labs.   Please schedule a follow up appointment in 6 months.

## 2018-03-12 NOTE — Assessment & Plan Note (Signed)
Following with endocrinology, feels well managed. Continue Clomid, continue weaning down off of testosterone.

## 2018-03-12 NOTE — Progress Notes (Signed)
Subjective:    Patient ID: Adam Wise, male    DOB: 1951/08/13, 67 y.o.   MRN: [id number]  HPI  Adam Wise is a 67 year old male who presents today for follow up. He is actually due for diabetes follow up in one month.  1) Type 2 Diabetes:  Current medications include: Metformin 1000 mg BID, glipizide 10 mg BID, pioglitazone 45 mg.  He does not check his glucose levels.   Last A1C: 7.9 in late January 2019 Last Eye Exam: Completed in December 2018 Last Foot Exam: Due today Pneumonia Vaccination: Completed Prevnar in 2016, Pneumovax in 2013 ACE/ARB: Lisinopril  Statin: None, LDL of 100 in January 2019. Refuses statin treatment.   Diet currently consists of:  Breakfast: English muffin Lunch: Home made sandwich Dinner: Meat, vegetable, little starch Snacks: Occasionally crackers Desserts: None Beverages: Milk, some water, coffee (1 pot per day), sweet tea occasionally   Exercise: He is not currently exercising  The 10-year ASCVD risk score [name], et al., 2013) is: 32.5%   Values used to calculate the score:     Age: 39 years     Sex: Male     Is Non-Hispanic African American: No     Diabetic: Yes     Tobacco smoker: No     Systolic Blood Pressure: 740 mmHg     Is BP treated: Yes     HDL Cholesterol: 33.7 mg/dL     Total Cholesterol: 163 mg/dL   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 Currently managed on Clomid 50 mg, testosterone cypionate with plans to reduce and then wean off all together. He is due for follow up in August 2019.  3) Essential Hypertension: Currently managed on Amlodipine 10 mg, lisinopril 5 mg. He denies chest pain, dizziness, headaches.   BP Readings from Last 3 Encounters:  03/12/18 128/76  11/25/17 134/80  10/06/17 (!) 162/85      Review of Systems  Eyes: Negative for visual disturbance.  Respiratory: Negative for shortness of  breath.   Cardiovascular: Negative for chest pain.  Neurological: Negative for dizziness, numbness and headaches.       Past Medical History:  Diagnosis Date  . Chickenpox   . Chronic knee pain   . Chronic low back pain   . Essential hypertension   . GERD (gastroesophageal reflux disease)   . OSA (obstructive sleep apnea)   . Testosterone deficiency   . Type 2 diabetes mellitus ([place])      Social History   Socioeconomic History  . Marital status: Married    Spouse name: Not on file  . Number of children: Not on file  . Years of education: Not on file  . Highest education level: Not on file  Occupational History  . Not on file  Social Needs  . Financial resource strain: Not on file  . Food insecurity:    Worry: Not on file    Inability: Not on file  . Transportation needs:    Medical: Not on file    Non-medical: Not on file  Tobacco Use  . Smoking status: Former Research scientist (life sciences)  . Smokeless tobacco: Never Used  Substance and Sexual Activity  . Alcohol use: Yes  . Drug use: Not on file  . Sexual activity: Not on file  Lifestyle  . Physical activity:    Days per week: Not on file    Minutes per session: Not on  file  . Stress: Not on file  Relationships  . Social connections:    Talks on phone: Not on file    Gets together: Not on file    Attends religious service: Not on file    Active member of club or organization: Not on file    Attends meetings of clubs or organizations: Not on file    Relationship status: Not on file  . Intimate partner violence:    Fear of current or ex partner: Not on file    Emotionally abused: Not on file    Physically abused: Not on file    Forced sexual activity: Not on file  Other Topics Concern  . Not on file  Social History Narrative   Married.   Moved from [place].   Retired.    Past Surgical History:  Procedure Laterality Date  . REPLACEMENT TOTAL KNEE BILATERAL  2015  . TONSILLECTOMY  1960    Family History  Problem Relation  Age of Onset  . Cancer Mother   . Hypertension Mother   . Arthritis Father   . Asthma Father   . Cancer Father   . COPD Father   . Heart attack Father   . Hypertension Sister   . Cancer Sister   . Diabetes Sister   . Asthma Son   . Birth defects Maternal Grandfather   . Arthritis Paternal Grandmother   . Diabetes Paternal Grandmother   . Arthritis Paternal Grandfather   . Asthma Sister   . Cancer Sister   . COPD Sister   . Arthritis Sister   . Asthma Sister   . Diabetes Sister   . Other Neg Hx        pituitary disorder    Allergies  Allergen Reactions  . Bupropion     Racing heart    Current Outpatient Medications on File Prior to Visit  Medication Sig Dispense Refill  . amLODipine (NORVASC) 10 MG tablet Take 1 tablet (10 mg total) daily by mouth. 90 tablet 1  . aspirin EC 81 MG tablet Take by mouth.    . clomiPHENE (CLOMID) 50 MG tablet Take 0.5 tablets by mouth daily.    . Coenzyme Q10 (COQ10 PO) Take 300 mg by mouth daily.    Marland Kitchen esomeprazole (NEXIUM) 20 MG capsule Take 20 mg by mouth daily at 12 noon.    Marland Kitchen glipiZIDE (GLUCOTROL) 10 MG tablet Take 1 tablet (10 mg total) by mouth 2 (two) times daily before a meal. 180 tablet 1  . KRILL OIL PO Take 350 mg by mouth daily.    Marland Kitchen lisinopril (PRINIVIL,ZESTRIL) 5 MG tablet Take 5 mg by mouth daily.    . Magnesium 500 MG TABS Take 500 mg by mouth daily.    . metFORMIN (GLUCOPHAGE) 1000 MG tablet Take 1 tablet (1,000 mg total) by mouth 2 (two) times daily with a meal. 180 tablet 1  . Multiple Vitamin (MULTI-VITAMINS) TABS Take 1 tablet by mouth daily.     . pioglitazone (ACTOS) 45 MG tablet Take 45 mg by mouth daily.    . sildenafil (VIAGRA) 50 MG tablet Take 50 mg by mouth daily as needed for erectile dysfunction.    . SYRINGE DISPOSABLE 3CC 3 ML MISC Use as instructed for injection 100 each 2  . testosterone cypionate (DEPOTESTOSTERONE CYPIONATE) 200 MG/ML injection Inject 1 mL (200 mg total) into the muscle every 14  (fourteen) days. 10 mL 0   No current facility-administered medications on file prior  to visit.     BP 128/76 (BP Location: Left Arm, Patient Position: Sitting, Cuff Size: Large)   Pulse 87   Temp 98.1 F (36.7 C) (Oral)   Ht 5\' 9"  (1.753 m)   Wt 295 lb (133.8 kg)   SpO2 93%   BMI 43.56 kg/m    Objective:   Physical Exam  Constitutional: He is oriented to person, place, and time. He appears well-nourished.  Neck: Neck supple.  Cardiovascular: Normal rate and regular rhythm.  Pulmonary/Chest: Effort normal and breath sounds normal. He has no wheezes. He has no rales.  Neurological: He is alert and oriented to person, place, and time.  Skin: Skin is warm and dry.          Assessment & Plan:

## 2018-04-06 ENCOUNTER — Other Ambulatory Visit: Payer: Self-pay | Admitting: Primary Care

## 2018-04-06 ENCOUNTER — Ambulatory Visit: Payer: Medicare Other | Admitting: Urology

## 2018-04-06 DIAGNOSIS — E1165 Type 2 diabetes mellitus with hyperglycemia: Secondary | ICD-10-CM

## 2018-04-06 MED ORDER — BLOOD GLUCOSE MONITOR KIT: PACK | 0 refills | Status: DC

## 2018-04-08 ENCOUNTER — Telehealth: Payer: Self-pay

## 2018-04-08 NOTE — Telephone Encounter (Signed)
Called to make sure pt was f/u w/ endocrinology, he states his endocrinologist is tapering him off of testosterone and he needs a prescription for 1/2 the regular dose every 2 weeks for a month to get him to his next endo appointment. At that time, Endo will be following pt labs and diagnosis. [facility] [place]? Pt states instead of the 86ml he will now be taking 0.73ml every 2 weeks.

## 2018-04-09 ENCOUNTER — Ambulatory Visit: Payer: Medicare Other | Admitting: Urology

## 2018-04-09 ENCOUNTER — Telehealth: Payer: Self-pay | Admitting: Primary Care

## 2018-04-09 DIAGNOSIS — E1165 Type 2 diabetes mellitus with hyperglycemia: Secondary | ICD-10-CM

## 2018-04-09 MED ORDER — BLOOD GLUCOSE MONITOR KIT
PACK | 0 refills | Status: DC
Start: 2018-04-09 — End: 2024-01-04

## 2018-04-09 MED ORDER — TESTOSTERONE CYPIONATE 200 MG/ML IM SOLN: 100.0000 mg | INTRAMUSCULAR | 0 refills | Status: DC

## 2018-04-09 MED ORDER — BLOOD GLUCOSE MONITOR KIT
PACK | 0 refills | Status: DC
Start: 2018-04-09 — End: 2018-04-09

## 2018-04-09 NOTE — Telephone Encounter (Signed)
Rx was sent  

## 2018-04-09 NOTE — Telephone Encounter (Signed)
Spoken to patient's wife and notified her a new Rx has been send to the pharmacy.

## 2018-04-09 NOTE — Telephone Encounter (Signed)
Copied from CRM #[id number]. Topic: Quick Communication - Rx Refill/Question °>> Apr 09, 2018  1:30 PM Lander, Lumin L wrote: °Medication: blood glucose kit and supplies °Has the patient contacted their pharmacy? Yes.   °(Agent: If no, request that the patient contact the pharmacy for the refill.) °Preferred Pharmacy (with phone number or street name): [id number] [place], [place] - [address] °[address] [place] [address] Phone: [phone] Fax: [phone] °Agent: Please be advised that RX refills may take up to 3 business days. We ask that you follow-up with your pharmacy. ° °Pharmacy says the coding for the glucose meter is incorrect for Medicare so they can not provide the patient with the monitor and supplies. The code listed is E11.65. Please call patient once resolved.. °

## 2018-04-09 NOTE — Telephone Encounter (Signed)
Pt. Returning call, got disconnected.  This is in regards to kit and supplies  [phone]  Peg, wife

## 2018-04-14 ENCOUNTER — Other Ambulatory Visit (INDEPENDENT_AMBULATORY_CARE_PROVIDER_SITE_OTHER): Payer: Medicare Other

## 2018-04-14 DIAGNOSIS — E118 Type 2 diabetes mellitus with unspecified complications: Secondary | ICD-10-CM | POA: Diagnosis not present

## 2018-04-14 LAB — LIPID PANEL
CHOL/HDL RATIO: 5
CHOLESTEROL: 152 mg/dL (ref 0–200)
HDL: 30.7 mg/dL — ABNORMAL LOW (ref >39.00)
NonHDL: 121.63
Triglycerides: 204 mg/dL — ABNORMAL HIGH (ref 0.0–149.0)
VLDL: 40.8 mg/dL — ABNORMAL HIGH (ref 0.0–40.0)

## 2018-04-14 LAB — HEMOGLOBIN A1C: HEMOGLOBIN A1C: 8.3 % — AB (ref 4.6–6.5)

## 2018-04-14 LAB — LDL CHOLESTEROL, DIRECT: Direct LDL: 99 mg/dL

## 2018-04-19 ENCOUNTER — Telehealth: Payer: Self-pay | Admitting: Primary Care

## 2018-04-19 NOTE — Telephone Encounter (Signed)
Will you please call patient and ask him the questions I put in my last result note for his labs? He hasn't responded via my chart as requested. I need to know if he's willing to go on insulin and a cholesterol medication.

## 2018-04-20 NOTE — Telephone Encounter (Signed)
Message left for patient to return my call.  

## 2018-04-22 NOTE — Telephone Encounter (Signed)
Message left for patient to return my call.  

## 2018-04-23 NOTE — Telephone Encounter (Signed)
Noted. Will address again at his next office visit. He will be seeing endocrinology again in August who is aware of his diabetes.

## 2018-04-23 NOTE — Telephone Encounter (Signed)
Patient stated that he refuse to go on insulin and cholesterol medication.

## 2018-04-26 ENCOUNTER — Other Ambulatory Visit: Payer: Self-pay | Admitting: Primary Care

## 2018-04-26 DIAGNOSIS — E230 Hypopituitarism: Secondary | ICD-10-CM

## 2018-04-26 DIAGNOSIS — E1169 Type 2 diabetes mellitus with other specified complication: Secondary | ICD-10-CM

## 2018-04-26 DIAGNOSIS — I10 Essential (primary) hypertension: Secondary | ICD-10-CM

## 2018-04-26 NOTE — Telephone Encounter (Signed)
Refills sent to pharmacy. 

## 2018-04-26 NOTE — Telephone Encounter (Signed)
Ok to refill? Electronically refill request. Rx have not been prescribed by Allie Bossier. Last seen on 03/12/2018

## 2018-04-27 ENCOUNTER — Other Ambulatory Visit
Admission: RE | Admit: 2018-04-27 | Discharge: 2018-04-27 | Disposition: A | Discharge status: Home / Self Care | Payer: Medicare Other | Source: Ambulatory Visit | Attending: "Endocrinology | Admitting: "Endocrinology

## 2018-04-27 ENCOUNTER — Ambulatory Visit
Admission: RE | Admit: 2018-04-27 | Discharge: 2018-04-27 | Disposition: A | Discharge status: Home / Self Care | Payer: Medicare Other | Source: Ambulatory Visit | Attending: "Endocrinology | Admitting: "Endocrinology

## 2018-04-27 DIAGNOSIS — E236 Other disorders of pituitary gland: Secondary | ICD-10-CM | POA: Insufficient documentation

## 2018-04-27 DIAGNOSIS — R7989 Other specified abnormal findings of blood chemistry: Secondary | ICD-10-CM | POA: Diagnosis not present

## 2018-04-27 LAB — BASIC METABOLIC PANEL
ANION GAP: 11 (ref 5–15)
BUN: 22 mg/dL — ABNORMAL HIGH (ref 6–20)
CHLORIDE: 102 mmol/L (ref 101–111)
CO2: 23 mmol/L (ref 22–32)
Calcium: 8.9 mg/dL (ref 8.9–10.3)
Creatinine, Ser: 1.35 mg/dL — ABNORMAL HIGH (ref 0.61–1.24)
GFR calc Af Amer: >60 mL/min (ref >60)
GFR calc non Af Amer: 53 mL/min — ABNORMAL LOW (ref >60)
GLUCOSE: 173 mg/dL — AB (ref 65–99)
POTASSIUM: 4.5 mmol/L (ref 3.5–5.1)
Sodium: 136 mmol/L (ref 135–145)

## 2018-04-27 MED ORDER — GADOBENATE DIMEGLUMINE 529 MG/ML IV SOLN
10.0000 mL | Freq: Once | INTRAVENOUS | Status: AC | PRN
  Administered 2018-04-27: 10 mL via INTRAVENOUS

## 2018-05-05 ENCOUNTER — Other Ambulatory Visit: Payer: Self-pay | Admitting: Primary Care

## 2018-05-06 ENCOUNTER — Encounter: Payer: Self-pay | Admitting: Primary Care

## 2018-05-06 ENCOUNTER — Telehealth: Payer: Self-pay | Admitting: Primary Care

## 2018-05-06 ENCOUNTER — Other Ambulatory Visit: Payer: Self-pay | Admitting: Primary Care

## 2018-05-06 DIAGNOSIS — E119 Type 2 diabetes mellitus without complications: Secondary | ICD-10-CM

## 2018-05-06 MED ORDER — GLUCOSE BLOOD VI STRP: ORAL_STRIP | 2 refills | Status: DC

## 2018-05-06 MED ORDER — LANCETS MISC: 2 refills | Status: DC

## 2018-05-06 NOTE — Telephone Encounter (Signed)
Done. Rx has been re-sent.

## 2018-05-06 NOTE — Telephone Encounter (Signed)
Copied from [place] [id number] [phone]. Topic: Quick Communication - Rx Refill/Question >> May 06, 2018 10:13 AM Scherrie Gerlach wrote: Medication: glucose blood (ONE TOUCH ULTRA TEST) test strip  pharmacy calling to request this Rx be sent back to them with a dx code and ICD10 code for medicare billing purposes.  [id number] [place], [place] [phone] (Phone) [phone] (Fax)

## 2018-05-26 ENCOUNTER — Ambulatory Visit: Payer: PRIVATE HEALTH INSURANCE | Admitting: Endocrinology

## 2018-05-26 ENCOUNTER — Ambulatory Visit (INDEPENDENT_AMBULATORY_CARE_PROVIDER_SITE_OTHER)
Admission: RE | Admit: 2018-05-26 | Discharge: 2018-05-26 | Disposition: A | Discharge status: Home / Self Care | Payer: Medicare Other | Source: Ambulatory Visit | Attending: Family Medicine | Admitting: Family Medicine

## 2018-05-26 ENCOUNTER — Ambulatory Visit (INDEPENDENT_AMBULATORY_CARE_PROVIDER_SITE_OTHER): Payer: Medicare Other | Admitting: Family Medicine

## 2018-05-26 ENCOUNTER — Encounter: Payer: Self-pay | Admitting: Family Medicine

## 2018-05-26 VITALS — BP 148/78 | HR 85 | Temp 97.7°F | Ht 69.0 in | Wt 302.0 lb

## 2018-05-26 DIAGNOSIS — M19012 Primary osteoarthritis, left shoulder: Secondary | ICD-10-CM | POA: Diagnosis not present

## 2018-05-26 DIAGNOSIS — M25512 Pain in left shoulder: Secondary | ICD-10-CM | POA: Diagnosis not present

## 2018-05-26 DIAGNOSIS — S4992XA Unspecified injury of left shoulder and upper arm, initial encounter: Secondary | ICD-10-CM

## 2018-05-26 MED ORDER — TRAMADOL HCL 50 MG PO TABS: 50.0000 mg | ORAL_TABLET | Freq: Three times a day (TID) | ORAL | 0 refills | Status: AC | PRN

## 2018-05-26 NOTE — Patient Instructions (Signed)
Good to see you today  Please go to xray then see one of the referral coordinators  I have sent in pain medication to your pharmacy, you can also use extra strength tylenol two tablets twice a day

## 2018-05-26 NOTE — Progress Notes (Signed)
Subjective:    Patient ID: Adam Wise, male    DOB: [birth year], 67 y.o.   MRN: [id number]  HPI This is a 66 yo male, accompanied by his wife, who presents today with left shoulder pain x 2 days. He has history of bilateral shoulder surgery. He is left handed. He was in his crawl space and was changing his filters and moving buckets. Later that day and yesterday had pain. Has taken some tylenol and used some ice/heat. Pain is at end of shoulder down arm and across upper back. Last night had some numbness/tingling fingers. Has history of partial tear on left shoulder with some improvement with PT. Has never been the same.   Past Medical History:  Diagnosis Date  . Chickenpox   . Chronic knee pain   . Chronic low back pain   . Essential hypertension   . GERD (gastroesophageal reflux disease)   . OSA (obstructive sleep apnea)   . Testosterone deficiency   . Type 2 diabetes mellitus ([place])    Past Surgical History:  Procedure Laterality Date  . REPLACEMENT TOTAL KNEE BILATERAL  2015  . TONSILLECTOMY  1960   Family History  Problem Relation Age of Onset  . Cancer Mother   . Hypertension Mother   . Arthritis Father   . Asthma Father   . Cancer Father   . COPD Father   . Heart attack Father   . Hypertension Sister   . Cancer Sister   . Diabetes Sister   . Asthma Son   . Birth defects Maternal Grandfather   . Arthritis Paternal Grandmother   . Diabetes Paternal Grandmother   . Arthritis Paternal Grandfather   . Asthma Sister   . Cancer Sister   . COPD Sister   . Arthritis Sister   . Asthma Sister   . Diabetes Sister   . Other Neg Hx        pituitary disorder   Social History   Tobacco Use  . Smoking status: Former Research scientist (life sciences)  . Smokeless tobacco: Never Used  Substance Use Topics  . Alcohol use: Yes  . Drug use: Not on file      Review of Systems Per HPI    Objective:   Physical Exam  Constitutional: He is oriented to person, place, and time. He appears  well-developed and well-nourished. No distress.  HENT:  Head: Normocephalic and atraumatic.  Cardiovascular: Normal rate.  Pulmonary/Chest: Effort normal.  Musculoskeletal:       Left shoulder: He exhibits decreased range of motion (unable to lift arm), tenderness, bony tenderness and pain (with palpation and movement).  Neurological: He is alert and oriented to person, place, and time.  Skin: Skin is warm and dry. He is not diaphoretic.  Psychiatric: He has a normal mood and affect. His behavior is normal. Judgment and thought content normal.  Vitals reviewed.     BP (!) 148/78 (BP Location: Right Arm, Patient Position: Sitting, Cuff Size: Large)   Pulse 85   Temp 97.7 F (36.5 C) (Oral)   Ht 5\' 9"  (1.753 m)   Wt (!) 302 lb (137 kg)   SpO2 96%   BMI 44.60 kg/m  Wt Readings from Last 3 Encounters:  05/26/18 (!) 302 lb (137 kg)  03/12/18 295 lb (133.8 kg)  11/25/17 298 lb 6.4 oz (135.4 kg)       Assessment & Plan:  Discussed with Dr. Lorelei Pont who also examined patient 1. Acute pain of left shoulder - suspect  rotator cuff tear, will get xray and if negative, followed by MRI - DG Shoulder Left; Future - MR Shoulder Left Wo Contrast; Future - traMADol (ULTRAM) 50 MG tablet; Take 1-2 tablets (50-100 mg total) by mouth every 8 (eight) hours as needed for up to 5 days.  Dispense: 30 tablet; Refill: 0  2. Injury of left shoulder, initial encounter - MR Shoulder Left Wo Contrast; Future   Clarene Reamer, FNP-BC  [facility]  05/26/2018 2:33 PM

## 2018-05-31 ENCOUNTER — Encounter (HOSPITAL_COMMUNITY): Payer: Self-pay

## 2018-05-31 ENCOUNTER — Ambulatory Visit (HOSPITAL_COMMUNITY)
Admission: RE | Admit: 2018-05-31 | Discharge: 2018-05-31 | Disposition: A | Discharge status: Home / Self Care | Payer: Medicare Other | Source: Ambulatory Visit | Attending: Family Medicine | Admitting: Family Medicine

## 2018-05-31 ENCOUNTER — Encounter: Payer: Self-pay | Admitting: Family Medicine

## 2018-05-31 DIAGNOSIS — S4992XA Unspecified injury of left shoulder and upper arm, initial encounter: Secondary | ICD-10-CM

## 2018-05-31 DIAGNOSIS — M25512 Pain in left shoulder: Secondary | ICD-10-CM

## 2018-06-01 ENCOUNTER — Other Ambulatory Visit: Payer: Self-pay | Admitting: Family Medicine

## 2018-06-01 DIAGNOSIS — R936 Abnormal findings on diagnostic imaging of limbs: Secondary | ICD-10-CM

## 2018-06-01 DIAGNOSIS — M25512 Pain in left shoulder: Secondary | ICD-10-CM

## 2018-06-01 NOTE — Progress Notes (Signed)
mri

## 2018-06-08 ENCOUNTER — Ambulatory Visit (HOSPITAL_COMMUNITY)
Admission: RE | Admit: 2018-06-08 | Discharge: 2018-06-08 | Disposition: A | Discharge status: Home / Self Care | Payer: Medicare Other | Source: Ambulatory Visit | Attending: Family Medicine | Admitting: Family Medicine

## 2018-06-08 DIAGNOSIS — M75102 Unspecified rotator cuff tear or rupture of left shoulder, not specified as traumatic: Secondary | ICD-10-CM | POA: Diagnosis not present

## 2018-06-08 DIAGNOSIS — M25512 Pain in left shoulder: Secondary | ICD-10-CM

## 2018-06-08 DIAGNOSIS — M19012 Primary osteoarthritis, left shoulder: Secondary | ICD-10-CM | POA: Insufficient documentation

## 2018-06-08 DIAGNOSIS — M7552 Bursitis of left shoulder: Secondary | ICD-10-CM | POA: Diagnosis not present

## 2018-06-08 DIAGNOSIS — M75112 Incomplete rotator cuff tear or rupture of left shoulder, not specified as traumatic: Secondary | ICD-10-CM | POA: Diagnosis not present

## 2018-06-09 NOTE — Addendum Note (Signed)
Addended by: Clarene Reamer B on: 06/09/2018 02:07 PM   Modules accepted: Orders

## 2018-06-11 DIAGNOSIS — M19012 Primary osteoarthritis, left shoulder: Secondary | ICD-10-CM | POA: Diagnosis not present

## 2018-07-13 ENCOUNTER — Other Ambulatory Visit: Payer: Self-pay | Admitting: Primary Care

## 2018-07-19 DIAGNOSIS — R7989 Other specified abnormal findings of blood chemistry: Secondary | ICD-10-CM | POA: Diagnosis not present

## 2018-07-21 DIAGNOSIS — M19012 Primary osteoarthritis, left shoulder: Secondary | ICD-10-CM | POA: Diagnosis not present

## 2018-07-26 DIAGNOSIS — R7989 Other specified abnormal findings of blood chemistry: Secondary | ICD-10-CM | POA: Diagnosis not present

## 2018-07-26 DIAGNOSIS — E236 Other disorders of pituitary gland: Secondary | ICD-10-CM | POA: Diagnosis not present

## 2018-09-04 ENCOUNTER — Other Ambulatory Visit: Payer: Self-pay | Admitting: Primary Care

## 2018-09-04 DIAGNOSIS — E230 Hypopituitarism: Secondary | ICD-10-CM

## 2018-09-13 ENCOUNTER — Ambulatory Visit (INDEPENDENT_AMBULATORY_CARE_PROVIDER_SITE_OTHER): Payer: Medicare Other | Admitting: Primary Care

## 2018-09-13 ENCOUNTER — Encounter: Payer: Self-pay | Admitting: Primary Care

## 2018-09-13 VITALS — BP 130/76 | HR 87 | Temp 98.2°F | Ht 69.0 in | Wt 300.5 lb

## 2018-09-13 DIAGNOSIS — I10 Essential (primary) hypertension: Secondary | ICD-10-CM

## 2018-09-13 DIAGNOSIS — E119 Type 2 diabetes mellitus without complications: Secondary | ICD-10-CM

## 2018-09-13 DIAGNOSIS — E349 Endocrine disorder, unspecified: Secondary | ICD-10-CM | POA: Diagnosis not present

## 2018-09-13 LAB — LIPID PANEL
CHOL/HDL RATIO: 5
Cholesterol: 147 mg/dL (ref 0–200)
HDL: 32.1 mg/dL — AB (ref >39.00)
NONHDL: 114.99
TRIGLYCERIDES: 257 mg/dL — AB (ref 0.0–149.0)
VLDL: 51.4 mg/dL — ABNORMAL HIGH (ref 0.0–40.0)

## 2018-09-13 LAB — BASIC METABOLIC PANEL
BUN: 20 mg/dL (ref 6–23)
CO2: 29 mEq/L (ref 19–32)
CREATININE: 1.34 mg/dL (ref 0.40–1.50)
Calcium: 9.1 mg/dL (ref 8.4–10.5)
Chloride: 100 mEq/L (ref 96–112)
GFR: 56.31 mL/min — AB (ref >60.00)
Glucose, Bld: 257 mg/dL — ABNORMAL HIGH (ref 70–99)
Potassium: 4.9 mEq/L (ref 3.5–5.1)
Sodium: 137 mEq/L (ref 135–145)

## 2018-09-13 LAB — LDL CHOLESTEROL, DIRECT: LDL DIRECT: 88 mg/dL

## 2018-09-13 LAB — HEMOGLOBIN A1C: Hgb A1c MFr Bld: 9.6 % — ABNORMAL HIGH (ref 4.6–6.5)

## 2018-09-13 NOTE — Patient Instructions (Addendum)
Stop by the lab prior to leaving today. I will notify you of your results once received.   Start exercising. You should be getting 150 minutes of exercise weekly.  Be sure to eat a diet that contains mostly vegetables, fruit, sprouted whole grains, lean protein.  Ensure you are consuming 64 ounces of water daily. Limit milk.   Check your blood sugar at various times of the day. Before breakfast, lunch, dinner 2 hours after a meal Bedtime   It was a pleasure to see you today!

## 2018-09-13 NOTE — Progress Notes (Signed)
Subjective:    Patient ID: Adam Wise, male    DOB: Sep 11, 1951, 67 y.o.   MRN: [id number]  HPI  Adam Wise is a 67 year old male who presents today for follow up.   1) Essential Hypertension: Currently managed on lisinopril 5 mg, amlodipine 10 mg.  BP Readings from Last 3 Encounters:  09/13/18 130/76  05/26/18 (!) 148/78  03/12/18 128/76   He is not checking his BP regularly.   2) Type 2 Diabetes: Currently managed on glipizide 10 mg twice daily, metformin 1000 mg twice daily, pioglitazone 45 mg.   He is checking his blood glucose one times daily and is getting readings of: AM fasting 180-200  Last A1C: 8.3 in May 2019 Last Eye Exam: Due in December  Last Foot Exam: Due in March 2020 Pneumonia Vaccination: Completed in 2016 ACE/ARB: Lisinopril  Statin: None, LDL of 99 in May 2019  Diet currently consists of:  Breakfast: English muffins with butter, typically two Lunch: Sandwiches, fast food Dinner: Meat, vegetables  Snacks: Crackers, nuts, popcorn  Desserts: Ice cream, sometimes  Beverages: Some water, milk during several meals, coffee, sweet tea, little soda  Exercise: He is not exercising   3) Testosterone Deficiency/Hypogonadotrophic hypogonadism: Currently managed on clomiphene 50 mg. He is following with endocrinology through [place] with his last visit being in August. Overall feels well managed.     Review of Systems  Eyes: Negative for visual disturbance.  Respiratory: Negative for shortness of breath.   Cardiovascular: Negative for chest pain.  Neurological: Negative for dizziness.       Past Medical History:  Diagnosis Date  . Chickenpox   . Chronic knee pain   . Chronic low back pain   . Essential hypertension   . GERD (gastroesophageal reflux disease)   . OSA (obstructive sleep apnea)   . Testosterone deficiency   . Type 2 diabetes mellitus ([place])      Social History   Socioeconomic History  . Marital status: Married    Spouse name: Not  on file  . Number of children: Not on file  . Years of education: Not on file  . Highest education level: Not on file  Occupational History  . Not on file  Social Needs  . Financial resource strain: Not on file  . Food insecurity:    Worry: Not on file    Inability: Not on file  . Transportation needs:    Medical: Not on file    Non-medical: Not on file  Tobacco Use  . Smoking status: Former Research scientist (life sciences)  . Smokeless tobacco: Never Used  Substance and Sexual Activity  . Alcohol use: Yes  . Drug use: Not on file  . Sexual activity: Not on file  Lifestyle  . Physical activity:    Days per week: Not on file    Minutes per session: Not on file  . Stress: Not on file  Relationships  . Social connections:    Talks on phone: Not on file    Gets together: Not on file    Attends religious service: Not on file    Active member of club or organization: Not on file    Attends meetings of clubs or organizations: Not on file    Relationship status: Not on file  . Intimate partner violence:    Fear of current or ex partner: Not on file    Emotionally abused: Not on file    Physically abused: Not on file  Forced sexual activity: Not on file  Other Topics Concern  . Not on file  Social History Narrative   Married.   Moved from [place].   Retired.    Past Surgical History:  Procedure Laterality Date  . REPLACEMENT TOTAL KNEE BILATERAL  2015  . TONSILLECTOMY  1960    Family History  Problem Relation Age of Onset  . Cancer Mother   . Hypertension Mother   . Arthritis Father   . Asthma Father   . Cancer Father   . COPD Father   . Heart attack Father   . Hypertension Sister   . Cancer Sister   . Diabetes Sister   . Asthma Son   . Birth defects Maternal Grandfather   . Arthritis Paternal Grandmother   . Diabetes Paternal Grandmother   . Arthritis Paternal Grandfather   . Asthma Sister   . Cancer Sister   . COPD Sister   . Arthritis Sister   . Asthma Sister   . Diabetes  Sister   . Other Neg Hx        pituitary disorder    Allergies  Allergen Reactions  . Bupropion     Racing heart    Current Outpatient Medications on File Prior to Visit  Medication Sig Dispense Refill  . amLODipine (NORVASC) 10 MG tablet Take 1 tablet (10 mg total) daily by mouth. 90 tablet 1  . aspirin EC 81 MG tablet Take by mouth.    . blood glucose meter kit and supplies KIT Dispense based on patient and insurance preference. Use up to four times daily as directed. 1 each 0  . clomiPHENE (CLOMID) 50 MG tablet Take 0.5 tablets by mouth daily.    . Coenzyme Q10 (COQ10 PO) Take 300 mg by mouth daily.    Marland Kitchen esomeprazole (NEXIUM) 20 MG capsule Take 20 mg by mouth daily at 12 noon.    Marland Kitchen glipiZIDE (GLUCOTROL) 10 MG tablet Take 1 tablet (10 mg total) by mouth 2 (two) times daily before a meal. 180 tablet 1  . KRILL OIL PO Take 350 mg by mouth daily.    . Lancets MISC USE UP TO 3 TIMES DAILY AS DIRECTED 100 each 2  . lisinopril (PRINIVIL,ZESTRIL) 5 MG tablet TAKE 1 TABLET BY MOUTH EVERY DAY 90 tablet 2  . metFORMIN (GLUCOPHAGE) 1000 MG tablet TAKE 1 TABLET (1,000 MG TOTAL) BY MOUTH 2 (TWO) TIMES DAILY WITH A MEAL. 180 tablet 1  . Multiple Vitamin (MULTI-VITAMINS) TABS Take 1 tablet by mouth daily.     . ONE TOUCH ULTRA TEST test strip USE UP TO 4 TIMES DAILY AS DIRECTED 100 each 2  . pioglitazone (ACTOS) 45 MG tablet TAKE 1 TABLET BY MOUTH EVERY DAY 90 tablet 2  . sildenafil (VIAGRA) 50 MG tablet TAKE 1 TABLET BY MOUTH EVERY DAY AS NEEDED 12 tablet 1  . SYRINGE DISPOSABLE 3CC 3 ML MISC Use as instructed for injection 100 each 2  . amoxicillin (AMOXIL) 500 MG tablet Take by mouth.    . fluticasone (FLONASE) 50 MCG/ACT nasal spray SPRAY 2 TIMES INTO EACH NOSTRIL DAILY AS DIRECTED     No current facility-administered medications on file prior to visit.     BP 130/76   Pulse 87   Temp 98.2 F (36.8 C) (Oral)   Ht '5\' 9"'$  (1.753 m)   Wt (!) 300 lb 8 oz (136.3 kg)   SpO2 97%   BMI 44.38  kg/m    Objective:  Physical Exam  Constitutional: He appears well-nourished.  Neck: Neck supple.  Cardiovascular: Normal rate and regular rhythm.  Respiratory: Effort normal and breath sounds normal.  Skin: Skin is warm and dry.           Assessment & Plan:

## 2018-09-13 NOTE — Assessment & Plan Note (Signed)
Stable in the office today, continue current regimen. 

## 2018-09-13 NOTE — Assessment & Plan Note (Signed)
Overall doing well, following with endocrinology. Continue same.

## 2018-09-13 NOTE — Assessment & Plan Note (Signed)
Repeat A1C pending.  Did discuss the need for low dose Lantus if A1C above goal of 8 given that he's at the highest dose of all three oral medications.  Discussed also to work on weight loss through diet and exercise. He is motivated.   Managed on ACE, no statin. LDL pending. Foot and eye exams UTD.  Follow up in 3-6 months depending on A1C.

## 2018-09-14 ENCOUNTER — Other Ambulatory Visit: Payer: Self-pay | Admitting: Primary Care

## 2018-09-14 DIAGNOSIS — E119 Type 2 diabetes mellitus without complications: Secondary | ICD-10-CM

## 2018-09-14 MED ORDER — INSULIN GLARGINE 100 UNIT/ML SOLOSTAR PEN: 10.0000 [IU] | PEN_INJECTOR | Freq: Every day | SUBCUTANEOUS | 3 refills | Status: DC

## 2018-09-14 MED ORDER — PEN NEEDLES 31G X 6 MM MISC: 1 refills | Status: DC

## 2018-09-14 MED ORDER — PRAVASTATIN SODIUM 40 MG PO TABS: ORAL_TABLET | ORAL | 3 refills | Status: DC

## 2018-09-14 NOTE — Telephone Encounter (Signed)
Insurance does not cover  Insulin Glargine (LANTUS) 100 UNIT/ML Solostar Pen  Pharmacy comment: Alternative Requested:NOT COVERED. CHANGE TO LEVEMIR, BASAGLAR, TCX.

## 2018-09-14 NOTE — Telephone Encounter (Signed)
Noted, Rx sent to pharmacy for basaglar.

## 2018-09-24 ENCOUNTER — Other Ambulatory Visit: Payer: Self-pay | Admitting: Primary Care

## 2018-09-27 DIAGNOSIS — R7989 Other specified abnormal findings of blood chemistry: Secondary | ICD-10-CM | POA: Diagnosis not present

## 2018-10-04 DIAGNOSIS — Z96653 Presence of artificial knee joint, bilateral: Secondary | ICD-10-CM | POA: Diagnosis not present

## 2018-10-04 DIAGNOSIS — Z6841 Body Mass Index (BMI) 40.0 and over, adult: Secondary | ICD-10-CM | POA: Diagnosis not present

## 2018-10-14 DIAGNOSIS — Z6841 Body Mass Index (BMI) 40.0 and over, adult: Secondary | ICD-10-CM | POA: Diagnosis not present

## 2018-10-14 DIAGNOSIS — R0602 Shortness of breath: Secondary | ICD-10-CM | POA: Diagnosis not present

## 2018-10-14 DIAGNOSIS — J449 Chronic obstructive pulmonary disease, unspecified: Secondary | ICD-10-CM | POA: Diagnosis not present

## 2018-10-14 DIAGNOSIS — G4733 Obstructive sleep apnea (adult) (pediatric): Secondary | ICD-10-CM | POA: Diagnosis not present

## 2018-10-19 ENCOUNTER — Other Ambulatory Visit: Payer: Self-pay | Admitting: Primary Care

## 2018-10-19 DIAGNOSIS — E119 Type 2 diabetes mellitus without complications: Secondary | ICD-10-CM

## 2018-10-25 ENCOUNTER — Other Ambulatory Visit (INDEPENDENT_AMBULATORY_CARE_PROVIDER_SITE_OTHER): Payer: Medicare Other

## 2018-10-25 DIAGNOSIS — E119 Type 2 diabetes mellitus without complications: Secondary | ICD-10-CM | POA: Diagnosis not present

## 2018-10-25 LAB — LIPID PANEL
CHOLESTEROL: 123 mg/dL (ref 0–200)
HDL: 36.9 mg/dL — ABNORMAL LOW (ref >39.00)
LDL CALC: 55 mg/dL (ref 0–99)
NONHDL: 86.22
Total CHOL/HDL Ratio: 3
Triglycerides: 157 mg/dL — ABNORMAL HIGH (ref 0.0–149.0)
VLDL: 31.4 mg/dL (ref 0.0–40.0)

## 2018-10-25 LAB — HEPATIC FUNCTION PANEL
ALBUMIN: 4.1 g/dL (ref 3.5–5.2)
ALT: 16 U/L (ref 0–53)
AST: 16 U/L (ref 0–37)
Alkaline Phosphatase: 62 U/L (ref 39–117)
Bilirubin, Direct: 0.1 mg/dL (ref 0.0–0.3)
Total Bilirubin: 0.4 mg/dL (ref 0.2–1.2)
Total Protein: 6.4 g/dL (ref 6.0–8.3)

## 2018-10-27 ENCOUNTER — Ambulatory Visit (INDEPENDENT_AMBULATORY_CARE_PROVIDER_SITE_OTHER): Payer: Medicare Other

## 2018-10-27 VITALS — BP 120/78 | HR 77 | Temp 97.6°F | Ht 69.0 in | Wt 306.0 lb

## 2018-10-27 DIAGNOSIS — Z23 Encounter for immunization: Secondary | ICD-10-CM | POA: Diagnosis not present

## 2018-10-27 DIAGNOSIS — Z1159 Encounter for screening for other viral diseases: Secondary | ICD-10-CM | POA: Diagnosis not present

## 2018-10-27 DIAGNOSIS — Z794 Long term (current) use of insulin: Secondary | ICD-10-CM | POA: Diagnosis not present

## 2018-10-27 DIAGNOSIS — Z0000 Encounter for general adult medical examination without abnormal findings: Secondary | ICD-10-CM

## 2018-10-27 DIAGNOSIS — E1165 Type 2 diabetes mellitus with hyperglycemia: Secondary | ICD-10-CM | POA: Diagnosis not present

## 2018-10-27 DIAGNOSIS — Z125 Encounter for screening for malignant neoplasm of prostate: Secondary | ICD-10-CM

## 2018-10-27 DIAGNOSIS — IMO0002 Reserved for concepts with insufficient information to code with codable children: Secondary | ICD-10-CM

## 2018-10-27 LAB — PSA, MEDICARE: PSA: 1.03 ng/ml (ref 0.10–4.00)

## 2018-10-27 LAB — HEMOGLOBIN A1C: HEMOGLOBIN A1C: 8.8 % — AB (ref 4.6–6.5)

## 2018-10-27 NOTE — Progress Notes (Signed)
Subjective:   Adam Wise is a 67 y.o. male who presents for an Initial Medicare Annual Wellness Visit.  Review of Systems  N/A Cardiac Risk Factors include: advanced age (>42mn, >>34women);male gender;diabetes mellitus;obesity (BMI >30kg/m2)    Objective:    Today's Vitals   10/27/18 1107  BP: 120/78  Pulse: 77  Temp: 97.6 F (36.4 C)  TempSrc: Oral  SpO2: 94%  Weight: (!) 306 lb (138.8 kg)  Height: '5\' 9"'$  (1.753 m)  PainSc: 2   PainLoc: Generalized   Body mass index is 45.19 kg/m.  Advanced Directives 10/27/2018 09/16/2017  Does Patient Have a Medical Advance Directive? No Yes  Would patient like information on creating a medical advance directive? Yes (MAU/Ambulatory/Procedural Areas - Information given) -    Current Medications (verified) Outpatient Encounter Medications as of 10/27/2018  Medication Sig  . amLODipine (NORVASC) 10 MG tablet TAKE 1 TABLET BY MOUTH EVERY DAY  . amoxicillin (AMOXIL) 500 MG tablet Take by mouth.  .Marland Kitchenaspirin EC 81 MG tablet Take by mouth.  . blood glucose meter kit and supplies KIT Dispense based on patient and insurance preference. Use up to four times daily as directed.  . Coenzyme Q10 (COQ10 PO) Take 300 mg by mouth daily.  .Marland Kitchenesomeprazole (NEXIUM) 20 MG capsule Take 20 mg by mouth daily at 12 noon.  . fluticasone (FLONASE) 50 MCG/ACT nasal spray SPRAY 2 TIMES INTO EACH NOSTRIL DAILY AS DIRECTED  . glipiZIDE (GLUCOTROL) 10 MG tablet Take 1 tablet (10 mg total) by mouth 2 (two) times daily before a meal.  . Insulin Glargine (BASAGLAR KWIKPEN) 100 UNIT/ML SOPN Inject 0.1 mLs (10 Units total) into the skin at bedtime.  . Insulin Pen Needle (PEN NEEDLES) 31G X 6 MM MISC Use nightly with insulin.  .Marland KitchenKRILL OIL PO Take 350 mg by mouth daily.  . Lancets MISC USE UP TO 3 TIMES DAILY AS DIRECTED  . lisinopril (PRINIVIL,ZESTRIL) 5 MG tablet TAKE 1 TABLET BY MOUTH EVERY DAY  . metFORMIN (GLUCOPHAGE) 1000 MG tablet TAKE 1 TABLET (1,000 MG TOTAL)  BY MOUTH 2 (TWO) TIMES DAILY WITH A MEAL.  . Multiple Vitamin (MULTI-VITAMINS) TABS Take 1 tablet by mouth daily.   . ONE TOUCH ULTRA TEST test strip USE UP TO 4 TIMES DAILY AS DIRECTED  . pioglitazone (ACTOS) 45 MG tablet TAKE 1 TABLET BY MOUTH EVERY DAY  . pravastatin (PRAVACHOL) 40 MG tablet Take 1 tablet by mouth every evening for cholesterol.  . sildenafil (VIAGRA) 50 MG tablet TAKE 1 TABLET BY MOUTH EVERY DAY AS NEEDED  . SYRINGE DISPOSABLE 3CC 3 ML MISC Use as instructed for injection  . testosterone cypionate (DEPOTESTOSTERONE CYPIONATE) 200 MG/ML injection Inject 200 mg into the muscle every 14 (fourteen) days.  . [DISCONTINUED] clomiPHENE (CLOMID) 50 MG tablet Take 0.5 tablets by mouth daily.   No facility-administered encounter medications on file as of 10/27/2018.     Allergies (verified) Bupropion   History: Past Medical History:  Diagnosis Date  . Chickenpox   . Chronic knee pain   . Chronic low back pain   . Essential hypertension   . GERD (gastroesophageal reflux disease)   . OSA (obstructive sleep apnea)   . Testosterone deficiency   . [place] 2 diabetes mellitus [place]    Past Surgical History:  Procedure Laterality Date  . REPLACEMENT TOTAL KNEE BILATERAL  2015  . TONSILLECTOMY  1960   Family History  Problem Relation Age of Onset  . Cancer Mother   .  Hypertension Mother   . Arthritis Father   . Asthma Father   . Cancer Father   . COPD Father   . Heart attack Father   . Hypertension Sister   . Cancer Sister   . Diabetes Sister   . Asthma Son   . Birth defects Maternal Grandfather   . Arthritis Paternal Grandmother   . Diabetes Paternal Grandmother   . Arthritis Paternal Grandfather   . Asthma Sister   . Cancer Sister   . COPD Sister   . Arthritis Sister   . Asthma Sister   . Diabetes Sister   . Other Neg Hx        pituitary disorder   Social History   Socioeconomic History  . Marital status: Married    Spouse name: Not on file  . Number  of children: Not on file  . Years of education: Not on file  . Highest education level: Not on file  Occupational History  . Not on file  Social Needs  . Financial resource strain: Not on file  . Food insecurity:    Worry: Not on file    Inability: Not on file  . Transportation needs:    Medical: Not on file    Non-medical: Not on file  Tobacco Use  . Smoking status: Former Research scientist (life sciences)  . Smokeless tobacco: Never Used  Substance and Sexual Activity  . Alcohol use: Yes    Comment: rarely  . Drug use: Not Currently  . Sexual activity: Yes  Lifestyle  . Physical activity:    Days per week: Not on file    Minutes per session: Not on file  . Stress: Not on file  Relationships  . Social connections:    Talks on phone: Not on file    Gets together: Not on file    Attends religious service: Not on file    Active member of club or organization: Not on file    Attends meetings of clubs or organizations: Not on file    Relationship status: Not on file  Other Topics Concern  . Not on file  Social History Narrative   Married.   Moved from [place].   Retired.   Tobacco Counseling Counseling given: No   Clinical Intake:  Pre-visit preparation completed: Yes  Pain : No/denies pain Pain Score: 2      Nutritional Status: BMI > 30  Obese Nutritional Risks: None Diabetes: Yes CBG done?: No Did pt. bring in CBG monitor from home?: No  How often do you need to have someone help you when you read instructions, pamphlets, or other written materials from your doctor or pharmacy?: 1 - Never What is the last grade level you completed in school?: 12th grade  Interpreter Needed?: No  Comments: pt lives with spouse Information entered by :: LPinson, LPN  Activities of Daily Living In your present state of health, do you have any difficulty performing the following activities: 10/27/2018  Hearing? N  Vision? N  Difficulty concentrating or making decisions? N  Walking or climbing  stairs? N  Dressing or bathing? N  Doing errands, shopping? N  Preparing Food and eating ? N  Using the Toilet? N  In the past six months, have you accidently leaked urine? N  Do you have problems with loss of bowel control? N  Managing your Medications? N  Managing your Finances? N  Housekeeping or managing your Housekeeping? N  Some recent data might be hidden  Immunizations and Health Maintenance Immunization History  Administered Date(s) Administered  . Influenza,inj,Quad PF,6+ Mos 10/27/2018  . Pneumococcal Conjugate-13 08/17/2015  . Pneumococcal Polysaccharide-23 11/25/2012, 10/27/2018  . Td 08/27/2016   There are no preventive care reminders to display for this patient.  Patient Care Team: [name], NP as PCP - General (Internal Medicine)  Indicate any recent Medical Services you may have received from other than [facility] providers in the past year (date may be approximate).    Assessment:   This is a routine wellness examination for Adam Wise.  Hearing/Vision screen  Hearing Screening   '125Hz'$  '250Hz'$  '500Hz'$  '1000Hz'$  '2000Hz'$  '3000Hz'$  '4000Hz'$  '6000Hz'$  '8000Hz'$   Right ear:   40 40 40  40    Left ear:   40 40 40  40    Vision Screening Comments: Vision exam in December 2018 @ [facility]  Dietary issues and exercise activities discussed: Current Exercise Habits: Home exercise routine, Type of exercise: strength training/weights;treadmill;Other - see comments(recumbent bike), Time (Minutes): 60, Frequency (Times/Week): 3, Weekly Exercise (Minutes/Week): 180, Intensity: Moderate, Exercise limited by: None identified  Goals    . Increase physical activity     Starting 10/27/2018, I will attempt to exercise for 60 minutes 3 days per week.       Depression Screen PHQ 2/9 Scores 10/27/2018  PHQ - 2 Score 0  PHQ- 9 Score 0    Fall Risk Fall Risk  10/27/2018 09/13/2018  Falls in the past year? 0 No   Cognitive Function: MMSE - Mini Mental State Exam 10/27/2018  Orientation  to time 5  Orientation to Place 5  Registration 3  Attention/ Calculation 0  Recall 3  Language- name 2 objects 0  Language- repeat 1  Language- follow 3 step command 3  Language- read & follow direction 0  Write a sentence 0  Copy design 0  Total score 20     PLEASE NOTE: A Mini-Cog screen was completed. Maximum score is 20. A value of 0 denotes this part of [place] MMSE was not completed or the patient failed this part of the Mini-Cog screening.   Mini-Cog Screening Orientation to Time - Max 5 pts Orientation to Place - Max 5 pts Registration - Max 3 pts Recall - Max 3 pts Language Repeat - Max 1 pts Language Follow 3 Step Command - Max 3 pts     Screening Tests Health Maintenance  Topic Date Due  . OPHTHALMOLOGY EXAM  11/14/2018  . FOOT EXAM  03/13/2019  . HEMOGLOBIN A1C  04/27/2019  . COLONOSCOPY  10/25/2023  . TETANUS/TDAP  08/27/2026  . INFLUENZA VACCINE  Completed  . Hepatitis C Screening  Completed  . PNA vac Low Risk Adult  Completed      Plan:     I have personally reviewed, addressed, and noted the following in the patient's chart:  A. Medical and social history B. Use of alcohol, tobacco or illicit drugs  C. Current medications and supplements D. Functional ability and status E.  Nutritional status F.  Physical activity G. Advance directives H. List of other physicians I.  Hospitalizations, surgeries, and ER visits in previous 12 months J.  [place] to include hearing, vision, cognitive, depression L. Referrals and appointments - none  In addition, I have reviewed and discussed with patient certain preventive protocols, quality metrics, and best practice recommendations. A written personalized care plan for preventive services as well as general preventive health recommendations were provided to patient.  See attached scanned  questionnaire for additional information.   Signed,   Lindell Noe, MHA, BS, LPN Health Coach

## 2018-10-27 NOTE — Progress Notes (Signed)
PCP notes:   Health maintenance:  A1C - completed today per pt request Flu vaccine - administered PPSV23 - administered  Abnormal screenings:   None  Patient concerns:   None  Nurse concerns:  None  Next PCP appt:   11/05/18 @ 1420

## 2018-10-27 NOTE — Progress Notes (Signed)
I reviewed health advisor's note, was available for consultation, and agree with documentation and plan.  

## 2018-10-27 NOTE — Patient Instructions (Signed)
Mr. Hornstein , Thank you for taking time to come for your Medicare Wellness Visit. I appreciate your ongoing commitment to your health goals. Please review the following plan we discussed and let me know if I can assist you in the future.   These are the goals we discussed: Goals    . Increase physical activity     Starting 10/27/2018, I will attempt to exercise for 60 minutes 3 days per week.        This is a list of the screening recommended for you and due dates:  Health Maintenance  Topic Date Due  . Eye exam for diabetics  11/14/2018  . Complete foot exam   03/13/2019  . Hemoglobin A1C  04/27/2019  . Colon Cancer Screening  10/25/2023  . Tetanus Vaccine  08/27/2026  . Flu Shot  Completed  .  Hepatitis C: One time screening is recommended by Center for Disease Control  (CDC) for  adults born from [birth year] through 1965.   Completed  . Pneumonia vaccines  Completed   Preventive Care for Adults  A healthy lifestyle and preventive care can promote health and wellness. Preventive health guidelines for adults include the following key practices.  . A routine yearly physical is a good way to check with your health care provider about your health and preventive screening. It is a chance to share any concerns and updates on your health and to receive a thorough exam.  . Visit your dentist for a routine exam and preventive care every 6 months. Brush your teeth twice a day and floss once a day. Good oral hygiene prevents tooth decay and gum disease.  . The frequency of eye exams is based on your age, health, family medical history, use  of contact lenses, and other factors. Follow your health care provider's recommendations for frequency of eye exams.  . Eat a healthy diet. Foods like vegetables, fruits, whole grains, low-fat dairy products, and lean protein foods contain the nutrients you need without too many calories. Decrease your intake of foods high in solid fats, added sugars, and salt.  Eat the right amount of calories for you. Get information about a proper diet from your health care provider, if necessary.  . Regular physical exercise is one of the most important things you can do for your health. Most adults should get at least 150 minutes of moderate-intensity exercise (any activity that increases your heart rate and causes you to sweat) each week. In addition, most adults need muscle-strengthening exercises on 2 or more days a week.  Silver Sneakers may be a benefit available to you. To determine eligibility, you may visit the website: www.silversneakers.com or contact program at [phone] Mon-Fri between 8AM-8PM.   . Maintain a healthy weight. The body mass index (BMI) is a screening tool to identify possible weight problems. It provides an estimate of body fat based on height and weight. Your health care provider can find your BMI and can help you achieve or maintain a healthy weight.   For adults 20 years and older: ? A BMI below 18.5 is considered underweight. ? A BMI of 18.5 to 24.9 is normal. ? A BMI of 25 to 29.9 is considered overweight. ? A BMI of 30 and above is considered obese.   . Maintain normal blood lipids and cholesterol levels by exercising and minimizing your intake of saturated fat. Eat a balanced diet with plenty of fruit and vegetables. Blood tests for lipids and cholesterol should begin at  age 56 and be repeated every 5 years. If your lipid or cholesterol levels are high, you are over 50, or you are at high risk for heart disease, you may need your cholesterol levels checked more frequently. Ongoing high lipid and cholesterol levels should be treated with medicines if diet and exercise are not working.  . If you smoke, find out from your health care provider how to quit. If you do not use tobacco, please do not start.  . If you choose to drink alcohol, please do not consume more than 2 drinks per day. One drink is considered to be 12 ounces (355  mL) of beer, 5 ounces (148 mL) of wine, or 1.5 ounces (44 mL) of liquor.  . If you are 28-45 years old, ask your health care provider if you should take aspirin to prevent strokes.  . Use sunscreen. Apply sunscreen liberally and repeatedly throughout the day. You should seek shade when your shadow is shorter than you. Protect yourself by wearing long sleeves, pants, a wide-brimmed hat, and sunglasses year round, whenever you are outdoors.  . Once a month, do a whole body skin exam, using a mirror to look at the skin on your back. Tell your health care provider of new moles, moles that have irregular borders, moles that are larger than a pencil eraser, or moles that have changed in shape or color.

## 2018-10-28 LAB — HEPATITIS C ANTIBODY
Hepatitis C Ab: NONREACTIVE
SIGNAL TO CUT-OFF: 0.01 (ref <1.00)

## 2018-11-01 ENCOUNTER — Telehealth: Payer: Self-pay | Admitting: Primary Care

## 2018-11-01 NOTE — Telephone Encounter (Signed)
Best number [phone]  Pt had to r/s his cpx.  He had death in family.  R/s to 01-01-2024 he wanted to know if he could be seen sooner?

## 2018-11-01 NOTE — Telephone Encounter (Signed)
Yes, please fit him in anywhere at his convenience.

## 2018-11-05 ENCOUNTER — Encounter: Payer: Medicare Other | Admitting: Primary Care

## 2018-11-16 ENCOUNTER — Encounter: Payer: Self-pay | Admitting: Primary Care

## 2018-11-16 ENCOUNTER — Ambulatory Visit (INDEPENDENT_AMBULATORY_CARE_PROVIDER_SITE_OTHER): Payer: Medicare Other | Admitting: Primary Care

## 2018-11-16 VITALS — BP 130/76 | HR 99 | Temp 98.4°F | Ht 69.0 in | Wt 310.5 lb

## 2018-11-16 DIAGNOSIS — K219 Gastro-esophageal reflux disease without esophagitis: Secondary | ICD-10-CM | POA: Diagnosis not present

## 2018-11-16 DIAGNOSIS — G4733 Obstructive sleep apnea (adult) (pediatric): Secondary | ICD-10-CM

## 2018-11-16 DIAGNOSIS — M2540 Effusion, unspecified joint: Secondary | ICD-10-CM | POA: Diagnosis not present

## 2018-11-16 DIAGNOSIS — E119 Type 2 diabetes mellitus without complications: Secondary | ICD-10-CM

## 2018-11-16 DIAGNOSIS — E230 Hypopituitarism: Secondary | ICD-10-CM

## 2018-11-16 DIAGNOSIS — I10 Essential (primary) hypertension: Secondary | ICD-10-CM

## 2018-11-16 MED ORDER — BASAGLAR KWIKPEN 100 UNIT/ML ~~LOC~~ SOPN: 15.0000 [IU] | PEN_INJECTOR | Freq: Every day | SUBCUTANEOUS | 3 refills | Status: DC

## 2018-11-16 NOTE — Assessment & Plan Note (Signed)
Doing well on Nexium 20 mg daily. Continue same.

## 2018-11-16 NOTE — Assessment & Plan Note (Signed)
Following with endocrinology. Managed on IM testosterone every 2 weeks. Continue same.

## 2018-11-16 NOTE — Assessment & Plan Note (Signed)
Improvement in A1c from 9.6-8.8 on recent labs. Will increase insulin to 15 units nightly.  Continue checking glucose as directed.  Managed on statin and ACE. Foot exam up-to-date. He should be getting his eye exam later this month. Pneumonia vaccination up-to-date.  Repeat A1c in 3 months, follow-up in 6 months.

## 2018-11-16 NOTE — Assessment & Plan Note (Signed)
Suspect this is secondary to chronic osteoarthritis.  Discussed short-term course of NSAIDs.  Can alternate with Tylenol.  Check uric acid level with next lab draw.

## 2018-11-16 NOTE — Assessment & Plan Note (Signed)
Compliant to CPAP nightly. Continue same.

## 2018-11-16 NOTE — Assessment & Plan Note (Signed)
Stable in the office today.  Continue current regimen. 

## 2018-11-16 NOTE — Progress Notes (Signed)
Subjective:    Patient ID: Adam Wise, male    DOB: November 12, 1951, 67 y.o.   MRN: [id number]  HPI  Mr. Dorrance is a 67 year old male who presents today for [place] Part 2. He saw our health advisor 2 weeks ago.  BP Readings from Last 3 Encounters:  11/16/18 130/76  10/27/18 120/78  09/13/18 130/76   He is checking his glucose every morning when fasting. He is getting readings of: 135-160's.  Highest reading: 173 Lowest reading: 137   He does make note of intermittent joint pain and swelling to the right MCP joint of the first digit.  This has been problematic for years since an old injury.  He denies ever being tested for gout.  Immunizations: -Tetanus: Completed in 2017 -Influenza: Completed this season -Pneumonia: Completed in 2019 -Shingles: Declines   Eye exam: Scheduled for this month Colonoscopy: Unsure, thinks he may be due in 2024 PSA:  1.03 in 2019     Review of Systems  Constitutional: Negative for unexpected weight change.  HENT: Negative for rhinorrhea.   Respiratory: Negative for cough and shortness of breath.   Cardiovascular: Negative for chest pain.  Gastrointestinal: Negative for constipation and diarrhea.  Genitourinary: Negative for difficulty urinating.  Musculoskeletal: Negative for arthralgias and myalgias.  Skin: Negative for rash.  Allergic/Immunologic: Negative for environmental allergies.  Neurological: Negative for dizziness, numbness and headaches.  Psychiatric/Behavioral: The patient is not nervous/anxious.        Past Medical History:  Diagnosis Date  . Chickenpox   . Chronic knee pain   . Chronic low back pain   . Essential hypertension   . GERD (gastroesophageal reflux disease)   . OSA (obstructive sleep apnea)   . Testosterone deficiency   . Type 2 diabetes mellitus ([place])      Social History   Socioeconomic History  . Marital status: Married    Spouse name: Not on file  . Number of children: Not on file  . Years of  education: Not on file  . Highest education level: Not on file  Occupational History  . Not on file  Social Needs  . Financial resource strain: Not on file  . Food insecurity:    Worry: Not on file    Inability: Not on file  . Transportation needs:    Medical: Not on file    Non-medical: Not on file  Tobacco Use  . Smoking status: Former Research scientist (life sciences)  . Smokeless tobacco: Never Used  Substance and Sexual Activity  . Alcohol use: Yes    Comment: rarely  . Drug use: Not Currently  . Sexual activity: Yes  Lifestyle  . Physical activity:    Days per week: Not on file    Minutes per session: Not on file  . Stress: Not on file  Relationships  . Social connections:    Talks on phone: Not on file    Gets together: Not on file    Attends religious service: Not on file    Active member of club or organization: Not on file    Attends meetings of clubs or organizations: Not on file    Relationship status: Not on file  . Intimate partner violence:    Fear of current or ex partner: Not on file    Emotionally abused: Not on file    Physically abused: Not on file    Forced sexual activity: Not on file  Other Topics Concern  . Not on file  Social  History Narrative   Married.   Moved from [place].   Retired.    Past Surgical History:  Procedure Laterality Date  . REPLACEMENT TOTAL KNEE BILATERAL  2015  . TONSILLECTOMY  1960    Family History  Problem Relation Age of Onset  . Cancer Mother   . Hypertension Mother   . Arthritis Father   . Asthma Father   . Cancer Father   . COPD Father   . Heart attack Father   . Hypertension Sister   . Cancer Sister   . Diabetes Sister   . Asthma Son   . Birth defects Maternal Grandfather   . Arthritis Paternal Grandmother   . Diabetes Paternal Grandmother   . Arthritis Paternal Grandfather   . Asthma Sister   . Cancer Sister   . COPD Sister   . Arthritis Sister   . Asthma Sister   . Diabetes Sister   . Other Neg Hx        pituitary  disorder    Allergies  Allergen Reactions  . Bupropion     Racing heart    Current Outpatient Medications on File Prior to Visit  Medication Sig Dispense Refill  . amLODipine (NORVASC) 10 MG tablet TAKE 1 TABLET BY MOUTH EVERY DAY 90 tablet 1  . amoxicillin (AMOXIL) 500 MG tablet Take by mouth.    Marland Kitchen aspirin EC 81 MG tablet Take by mouth.    . blood glucose meter kit and supplies KIT Dispense based on patient and insurance preference. Use up to four times daily as directed. 1 each 0  . Coenzyme Q10 (COQ10 PO) Take 300 mg by mouth daily.    Marland Kitchen esomeprazole (NEXIUM) 20 MG capsule Take 20 mg by mouth daily at 12 noon.    . fluticasone (FLONASE) 50 MCG/ACT nasal spray SPRAY 2 TIMES INTO EACH NOSTRIL DAILY AS DIRECTED    . glipiZIDE (GLUCOTROL) 10 MG tablet Take 1 tablet (10 mg total) by mouth 2 (two) times daily before a meal. 180 tablet 1  . Insulin Pen Needle (PEN NEEDLES) 31G X 6 MM MISC Use nightly with insulin. 100 each 1  . KRILL OIL PO Take 350 mg by mouth daily.    . Lancets MISC USE UP TO 3 TIMES DAILY AS DIRECTED 100 each 2  . lisinopril (PRINIVIL,ZESTRIL) 5 MG tablet TAKE 1 TABLET BY MOUTH EVERY DAY 90 tablet 2  . metFORMIN (GLUCOPHAGE) 1000 MG tablet TAKE 1 TABLET (1,000 MG TOTAL) BY MOUTH 2 (TWO) TIMES DAILY WITH A MEAL. 180 tablet 1  . Multiple Vitamin (MULTI-VITAMINS) TABS Take 1 tablet by mouth daily.     . ONE TOUCH ULTRA TEST test strip USE UP TO 4 TIMES DAILY AS DIRECTED 100 each 2  . pioglitazone (ACTOS) 45 MG tablet TAKE 1 TABLET BY MOUTH EVERY DAY 90 tablet 2  . pravastatin (PRAVACHOL) 40 MG tablet Take 1 tablet by mouth every evening for cholesterol. 90 tablet 3  . sildenafil (VIAGRA) 50 MG tablet TAKE 1 TABLET BY MOUTH EVERY DAY AS NEEDED 12 tablet 1  . SYRINGE DISPOSABLE 3CC 3 ML MISC Use as instructed for injection 100 each 2  . testosterone cypionate (DEPOTESTOSTERONE CYPIONATE) 200 MG/ML injection Inject 200 mg into the muscle every 14 (fourteen) days.     No  current facility-administered medications on file prior to visit.     BP 130/76   Pulse 99   Temp 98.4 F (36.9 C) (Oral)   Ht _0  (1.753  m)   Wt (!) 310 lb 8 oz (140.8 kg)   SpO2 94%   BMI 45.85 kg/m    Objective:   Physical Exam  Constitutional: He is oriented to person, place, and time. He appears well-nourished.  HENT:  Mouth/Throat: No oropharyngeal exudate.  Eyes: Pupils are equal, round, and reactive to light. EOM are normal.  Neck: Neck supple. No thyromegaly present.  Cardiovascular: Normal rate and regular rhythm.  Respiratory: Effort normal and breath sounds normal.  GI: Soft. Bowel sounds are normal. There is no tenderness.  Musculoskeletal: Normal range of motion.       Hands: Trace swelling with tenderness over right first MCP joint.  Neurological: He is alert and oriented to person, place, and time.  Skin: Skin is warm and dry.  Psychiatric: He has a normal mood and affect.           Assessment & Plan:

## 2018-11-16 NOTE — Patient Instructions (Addendum)
We've increased your basaglar insulin to 15 units once daily.   Start Ibuprofen 800 mg 2-3 times daily for 5 days. This will help to reduce inflammation, pain, swelling to your thumb.  Please check your records regarding the colonoscopy. I have that you are due this year.   Continue exercising. You should be getting 150 minutes of moderate intensity exercise weekly.  It is important that you improve your diet. Please limit carbohydrates in the form of white bread, rice, pasta, sweets, fast food, fried food, sugary drinks, etc. Increase your consumption of fresh fruits and vegetables, whole grains, lean protein.  Ensure you are consuming 64 ounces of water daily.  Schedule a lab only appointment for 3 months to repeat your A1C.  Please schedule a follow up appointment in 6 months for diabetes evaluation.   It was a pleasure to see you today!

## 2018-11-19 ENCOUNTER — Other Ambulatory Visit: Payer: Self-pay | Admitting: Primary Care

## 2018-11-19 DIAGNOSIS — Z1211 Encounter for screening for malignant neoplasm of colon: Secondary | ICD-10-CM

## 2018-11-22 ENCOUNTER — Other Ambulatory Visit: Payer: Self-pay

## 2018-11-22 DIAGNOSIS — Z86010 Personal history of colonic polyps: Secondary | ICD-10-CM

## 2018-12-07 ENCOUNTER — Encounter: Payer: Medicare Other | Admitting: Primary Care

## 2018-12-16 ENCOUNTER — Encounter: Payer: Self-pay | Admitting: *Deleted

## 2018-12-17 ENCOUNTER — Ambulatory Visit: Payer: Medicare Other | Admitting: Registered Nurse

## 2018-12-17 ENCOUNTER — Encounter
Admission: RE | Disposition: A | Discharge status: Home / Self Care | Payer: Self-pay | Source: Home / Self Care | Attending: Gastroenterology

## 2018-12-17 ENCOUNTER — Ambulatory Visit
Admission: RE | Admit: 2018-12-17 | Discharge: 2018-12-17 | Disposition: A | Discharge status: Home / Self Care | Payer: Medicare Other | Attending: Gastroenterology | Admitting: Gastroenterology

## 2018-12-17 ENCOUNTER — Encounter: Payer: Self-pay | Admitting: *Deleted

## 2018-12-17 DIAGNOSIS — Z96653 Presence of artificial knee joint, bilateral: Secondary | ICD-10-CM | POA: Diagnosis not present

## 2018-12-17 DIAGNOSIS — Z794 Long term (current) use of insulin: Secondary | ICD-10-CM | POA: Diagnosis not present

## 2018-12-17 DIAGNOSIS — I10 Essential (primary) hypertension: Secondary | ICD-10-CM | POA: Diagnosis not present

## 2018-12-17 DIAGNOSIS — Z86010 Personal history of colonic polyps: Secondary | ICD-10-CM | POA: Diagnosis not present

## 2018-12-17 DIAGNOSIS — D126 Benign neoplasm of colon, unspecified: Secondary | ICD-10-CM | POA: Diagnosis not present

## 2018-12-17 DIAGNOSIS — D122 Benign neoplasm of ascending colon: Secondary | ICD-10-CM | POA: Diagnosis not present

## 2018-12-17 DIAGNOSIS — Z87891 Personal history of nicotine dependence: Secondary | ICD-10-CM | POA: Insufficient documentation

## 2018-12-17 DIAGNOSIS — D120 Benign neoplasm of cecum: Secondary | ICD-10-CM

## 2018-12-17 DIAGNOSIS — Z6841 Body Mass Index (BMI) 40.0 and over, adult: Secondary | ICD-10-CM | POA: Diagnosis not present

## 2018-12-17 DIAGNOSIS — K5730 Diverticulosis of large intestine without perforation or abscess without bleeding: Secondary | ICD-10-CM

## 2018-12-17 DIAGNOSIS — D125 Benign neoplasm of sigmoid colon: Secondary | ICD-10-CM

## 2018-12-17 DIAGNOSIS — G4730 Sleep apnea, unspecified: Secondary | ICD-10-CM | POA: Diagnosis not present

## 2018-12-17 DIAGNOSIS — Z79899 Other long term (current) drug therapy: Secondary | ICD-10-CM | POA: Insufficient documentation

## 2018-12-17 DIAGNOSIS — K5790 Diverticulosis of intestine, part unspecified, without perforation or abscess without bleeding: Secondary | ICD-10-CM | POA: Diagnosis not present

## 2018-12-17 DIAGNOSIS — Z79890 Hormone replacement therapy (postmenopausal): Secondary | ICD-10-CM | POA: Insufficient documentation

## 2018-12-17 DIAGNOSIS — K219 Gastro-esophageal reflux disease without esophagitis: Secondary | ICD-10-CM | POA: Insufficient documentation

## 2018-12-17 DIAGNOSIS — E119 Type 2 diabetes mellitus without complications: Secondary | ICD-10-CM | POA: Insufficient documentation

## 2018-12-17 DIAGNOSIS — Z7982 Long term (current) use of aspirin: Secondary | ICD-10-CM | POA: Insufficient documentation

## 2018-12-17 DIAGNOSIS — G4733 Obstructive sleep apnea (adult) (pediatric): Secondary | ICD-10-CM | POA: Diagnosis not present

## 2018-12-17 DIAGNOSIS — K635 Polyp of colon: Secondary | ICD-10-CM | POA: Insufficient documentation

## 2018-12-17 DIAGNOSIS — Z1211 Encounter for screening for malignant neoplasm of colon: Secondary | ICD-10-CM | POA: Insufficient documentation

## 2018-12-17 HISTORY — PX: COLONOSCOPY WITH PROPOFOL: SHX5780

## 2018-12-17 LAB — GLUCOSE, CAPILLARY: Glucose-Capillary: 109 mg/dL — ABNORMAL HIGH (ref 70–99)

## 2018-12-17 SURGERY — COLONOSCOPY WITH PROPOFOL
Anesthesia: General

## 2018-12-17 MED ORDER — LIDOCAINE HCL (PF) 2 % IJ SOLN
INTRAMUSCULAR | Status: AC
  Filled 2018-12-17: qty 10

## 2018-12-17 MED ORDER — PROPOFOL 500 MG/50ML IV EMUL
INTRAVENOUS | Status: AC
  Filled 2018-12-17: qty 50

## 2018-12-17 MED ORDER — MIDAZOLAM HCL 2 MG/2ML IJ SOLN
INTRAMUSCULAR | Status: DC | PRN
  Administered 2018-12-17: 2 mg via INTRAVENOUS

## 2018-12-17 MED ORDER — PROPOFOL 10 MG/ML IV BOLUS
INTRAVENOUS | Status: DC | PRN
  Administered 2018-12-17: 70 mg via INTRAVENOUS

## 2018-12-17 MED ORDER — MIDAZOLAM HCL 2 MG/2ML IJ SOLN
INTRAMUSCULAR | Status: AC
  Filled 2018-12-17: qty 2

## 2018-12-17 MED ORDER — LIDOCAINE HCL (CARDIAC) PF 100 MG/5ML IV SOSY
PREFILLED_SYRINGE | INTRAVENOUS | Status: DC | PRN
  Administered 2018-12-17: 40 mg via INTRAVENOUS

## 2018-12-17 MED ORDER — PROPOFOL 500 MG/50ML IV EMUL
INTRAVENOUS | Status: DC | PRN
  Administered 2018-12-17: 150 ug/kg/min via INTRAVENOUS

## 2018-12-17 MED ORDER — SODIUM CHLORIDE 0.9 % IV SOLN
INTRAVENOUS | Status: DC
  Administered 2018-12-17: 09:00:00 via INTRAVENOUS

## 2018-12-17 NOTE — Op Note (Addendum)
[facility] Patient Name: Adam Wise Procedure Date: 12/17/2018 8:57 AM MRN: [id number] Account #: [id number] Date of Birth: Aug 02, 1951 Admit Type: Outpatient Age: 68 Room: [facility] ENDO ROOM 2 Gender: Male Note Status: Finalized Procedure:            Colonoscopy Indications:          High risk colon cancer surveillance: Personal history                        of colonic polyps Providers:            Varnita B. Bonna Gains MD, MD Referring MD:         [name] (Referring MD) Medicines:            Monitored Anesthesia Care Complications:        No immediate complications. Procedure:            Pre-Anesthesia Assessment:                       - ASA Grade Assessment: II - A patient with mild                        systemic disease.                       - Prior to the procedure, a History and Physical was                        performed, and patient medications, allergies and                        sensitivities were reviewed. The patient's tolerance of                        previous anesthesia was reviewed.                       - The risks and benefits of the procedure and the                        sedation options and risks were discussed with the                        patient. All questions were answered and informed                        consent was obtained.                       - Patient identification and proposed procedure were                        verified prior to the procedure by the physician, the                        nurse, the anesthesiologist, the anesthetist and the                        technician. The procedure was verified in the procedure  room.                       After obtaining informed consent, the colonoscope was                        passed under direct vision. Throughout the procedure,                        the patient's blood pressure, pulse, and oxygen   saturations were monitored continuously. The                        Colonoscope was introduced through the anus and                        advanced to the the cecum, identified by appendiceal                        orifice and ileocecal valve. The colonoscopy was                        performed with ease. The patient tolerated the                        procedure well. The quality of the bowel preparation                        was good. Findings:      The perianal and digital rectal examinations were normal.      Four sessile polyps were found in the ascending colon and cecum. The       polyps were 4 to 6 mm in size. The polyp was removed with a cold biopsy       forceps and The polyp was removed with a cold snare. Resection and       retrieval were complete. Depending on the size of the polyp a snare of       biopsy forceps was used. To prevent bleeding after the polypectomy, one       hemostatic clip was successfully placed. There was no bleeding at the       end of the procedure.      Three sessile, non-bleeding polyps were found in the sigmoid colon. The       polyps were 3 to 4 mm in size. The polyp was removed with a cold biopsy       forceps and The polyp was removed with a cold snare. Resection and       retrieval were complete.      Multiple diverticula were found in the sigmoid colon.      The exam was otherwise without abnormality.      The rectum, sigmoid colon, descending colon, transverse colon, ascending       colon and cecum appeared normal.      The retroflexed view of the distal rectum and anal verge was normal and       showed no anal or rectal abnormalities. Impression:           - Four 4 to 6 mm polyps in the ascending colon and in                        the  cecum, removed with a cold snare and removed with a                        cold biopsy forceps. Resected and retrieved. Clip was                        placed.                       - Three 3 to 4 mm,  non-bleeding polyps in the sigmoid                        colon, removed with a cold snare and removed with a                        cold biopsy forceps. Resected and retrieved.                       - Diverticulosis in the sigmoid colon.                       - The examination was otherwise normal.                       - The rectum, sigmoid colon, descending colon,                        transverse colon, ascending colon and cecum are normal.                       - The distal rectum and anal verge are normal on                        retroflexion view. Recommendation:       - Discharge patient to home (with escort).                       - High fiber diet.                       - Advance diet as tolerated.                       - Continue present medications.                       - Await pathology results.                       - Repeat colonoscopy in 3 years.                       - The findings and recommendations were discussed with                        the patient.                       - The findings and recommendations were discussed with                        the patient's family.                       -  Return to primary care physician as previously                        scheduled. Procedure Code(s):    --- Professional ---                       [phone], Colonoscopy, flexible; with removal of tumor(s),                        polyp(s), or other lesion(s) by snare technique Diagnosis Code(s):    --- Professional ---                       Z86.010, Personal history of colonic polyps                       D12.2, Benign neoplasm of ascending colon                       D12.0, Benign neoplasm of cecum                       D12.5, Benign neoplasm of sigmoid colon                       K57.30, Diverticulosis of large intestine without                        perforation or abscess without bleeding CPT copyright 2018 American Medical Association. All rights reserved. The codes  documented in this report are preliminary and upon coder review may  be revised to meet current compliance requirements.  Vonda Antigua, MD Margretta Sidle B. Bonna Gains MD, MD 12/17/2018 9:55:23 AM This report has been signed electronically. Number of Addenda: 0 Note Initiated On: 12/17/2018 8:57 AM Scope Withdrawal Time: 0 hours 31 minutes 51 seconds  Total Procedure Duration: 0 hours 36 minutes 18 seconds  Estimated Blood Loss: Estimated blood loss: none.      [facility]

## 2018-12-17 NOTE — Transfer of Care (Signed)
Immediate Anesthesia Transfer of Care Note  Patient: Adam Wise  Procedure(s) Performed: Procedure(s): COLONOSCOPY WITH PROPOFOL (N/A)  Patient Location: PACU and Endoscopy Unit  Anesthesia Type:General  Level of Consciousness: sedated  Airway & Oxygen Therapy: Patient Spontanous Breathing and Patient connected to nasal cannula oxygen  Post-op Assessment: Report given to RN and Post -op Vital signs reviewed and stable  Post vital signs: Reviewed and stable  Last Vitals:  Vitals:   12/17/18 0833 12/17/18 0950  BP: (!) 134/102 (!) 109/55  Pulse: 96 91  Resp: 16 15  Temp: 36.6 C (!) 36.3 C  SpO2: 06% 26%    Complications: No apparent anesthesia complications

## 2018-12-17 NOTE — Anesthesia Procedure Notes (Signed)
Date/Time: 12/17/2018 9:10 AM Performed by: Doreen Salvage, CRNA Pre-anesthesia Checklist: Patient identified, Emergency Drugs available, Suction available and Patient being monitored Patient Re-evaluated:Patient Re-evaluated prior to induction Oxygen Delivery Method: Nasal cannula Induction Type: IV induction Dental Injury: Teeth and Oropharynx as per pre-operative assessment  Comments: Nasal cannula with etCO2 monitoring

## 2018-12-17 NOTE — H&P (Signed)
Adam Antigua, MD [address], [place], [place], [place], [address] [address], [place], [place], [place], [address] Phone: [phone]  Fax: [phone]  Primary Care Physician:  [name], NP   Pre-Procedure History & Physical: HPI:  Adam Wise is a 68 y.o. male is here for a colonoscopy.   Past Medical History:  Diagnosis Date  . Chickenpox   . Chronic knee pain   . Chronic low back pain   . Essential hypertension   . GERD (gastroesophageal reflux disease)   . OSA (obstructive sleep apnea)   . Testosterone deficiency   . Type 2 diabetes mellitus ([place])     Past Surgical History:  Procedure Laterality Date  . JOINT REPLACEMENT Bilateral   . REPLACEMENT TOTAL KNEE BILATERAL  2015  . TONSILLECTOMY  1960    Prior to Admission medications   Medication Sig Start Date End Date Taking? Authorizing Provider  amLODipine (NORVASC) 10 MG tablet TAKE 1 TABLET BY MOUTH EVERY DAY 09/24/18  Yes [name], NP  aspirin EC 81 MG tablet Take by mouth.   Yes [provider]  esomeprazole (NEXIUM) 20 MG capsule Take 20 mg by mouth daily at 12 noon.   Yes [provider]  glipiZIDE (GLUCOTROL) 10 MG tablet Take 1 tablet (10 mg total) by mouth 2 (two) times daily before a meal. 11/11/17  Yes [name], NP  Insulin Glargine (BASAGLAR KWIKPEN) 100 UNIT/ML SOPN Inject 0.15 mLs (15 Units total) into the skin at bedtime. 11/16/18  Yes [name], NP  lisinopril (PRINIVIL,ZESTRIL) 5 MG tablet TAKE 1 TABLET BY MOUTH EVERY DAY 04/26/18  Yes [name], NP  metFORMIN (GLUCOPHAGE) 1000 MG tablet TAKE 1 TABLET (1,000 MG TOTAL) BY MOUTH 2 (TWO) TIMES DAILY WITH A MEAL. 10/19/18  Yes [name], NP  amoxicillin (AMOXIL) 500 MG tablet Take by mouth. 07/26/14   [provider]  blood glucose meter kit and supplies KIT Dispense based on patient and insurance preference. Use up to four times daily as directed. 04/09/18    [name], NP  Coenzyme Q10 (COQ10 PO) Take 300 mg by mouth daily.    [provider]  fluticasone (FLONASE) 50 MCG/ACT nasal spray SPRAY 2 TIMES INTO EACH NOSTRIL DAILY AS DIRECTED 03/16/17   [provider]  Insulin Pen Needle (PEN NEEDLES) 31G X 6 MM MISC Use nightly with insulin. 09/14/18   [name], NP  KRILL OIL PO Take 350 mg by mouth daily.    [provider]  Lancets MISC USE UP TO 3 TIMES DAILY AS DIRECTED 05/06/18   [name], NP  Multiple Vitamin (MULTI-VITAMINS) TABS Take 1 tablet by mouth daily.     [provider]  ONE TOUCH ULTRA TEST test strip USE UP TO 4 TIMES DAILY AS DIRECTED 07/13/18   [name], NP  pioglitazone (ACTOS) 45 MG tablet TAKE 1 TABLET BY MOUTH EVERY DAY 04/26/18   [name], NP  pravastatin (PRAVACHOL) 40 MG tablet Take 1 tablet by mouth every evening for cholesterol. 09/14/18   [name], NP  sildenafil (VIAGRA) 50 MG tablet TAKE 1 TABLET BY MOUTH EVERY DAY AS NEEDED 09/06/18   [name], NP  SYRINGE DISPOSABLE 3CC 3 ML MISC Use as instructed for injection 11/11/17   [name], NP  testosterone cypionate (DEPOTESTOSTERONE CYPIONATE) 200 MG/ML injection Inject 200 mg into the muscle every 14 (fourteen) days.  [provider]    Allergies as of 11/22/2018 - Review Complete 11/16/2018  Allergen Reaction Noted  . Bupropion  03/15/2014    Family History  Problem Relation Age of Onset  . Cancer Mother   . Hypertension Mother   . Arthritis Father   . Asthma Father   . Cancer Father   . COPD Father   . Heart attack Father   . Hypertension Sister   . Cancer Sister   . Diabetes Sister   . Asthma Son   . Birth defects Maternal Grandfather   . Arthritis Paternal Grandmother   . Diabetes Paternal Grandmother   . Arthritis Paternal Grandfather   . Asthma Sister   . Cancer Sister   . COPD Sister   . Arthritis Sister   . Asthma Sister   .  Diabetes Sister   . Other Neg Hx        pituitary disorder    Social History   Socioeconomic History  . Marital status: Married    Spouse name: Not on file  . Number of children: Not on file  . Years of education: Not on file  . Highest education level: Not on file  Occupational History  . Not on file  Social Needs  . Financial resource strain: Not on file  . Food insecurity:    Worry: Not on file    Inability: Not on file  . Transportation needs:    Medical: Not on file    Non-medical: Not on file  Tobacco Use  . Smoking status: Former Research scientist (life sciences)  . Smokeless tobacco: Never Used  Substance and Sexual Activity  . Alcohol use: Yes    Comment: rarely  . Drug use: Not Currently  . Sexual activity: Yes  Lifestyle  . Physical activity:    Days per week: Not on file    Minutes per session: Not on file  . Stress: Not on file  Relationships  . Social connections:    Talks on phone: Not on file    Gets together: Not on file    Attends religious service: Not on file    Active member of club or organization: Not on file    Attends meetings of clubs or organizations: Not on file    Relationship status: Not on file  . Intimate partner violence:    Fear of current or ex partner: Not on file    Emotionally abused: Not on file    Physically abused: Not on file    Forced sexual activity: Not on file  Other Topics Concern  . Not on file  Social History Narrative   Married.   Moved from [place].   Retired.    Review of Systems: See HPI, otherwise negative ROS  Physical Exam: BP (!) 134/102   Pulse 96   Temp 97.8 F (36.6 C) (Tympanic)   Resp 16   Ht 5' 9.5" (1.765 m)   Wt 136.1 kg   SpO2 96%   BMI 43.67 kg/m  General:   Alert,  pleasant and cooperative in NAD Head:  Normocephalic and atraumatic. Neck:  Supple; no masses or thyromegaly. Lungs:  Clear throughout to auscultation, normal respiratory effort.    Heart:  +S1, +S2, Regular rate and rhythm, No edema. Abdomen:   Soft, nontender and nondistended. Normal bowel sounds, without guarding, and without rebound.   Neurologic:  Alert and  oriented x4;  grossly normal neurologically.  Impression/Plan: Adam Wise is here for a colonoscopy to be  performed for  Polyp surveillance  Risks, benefits, limitations, and alternatives regarding  colonoscopy have been reviewed with the patient.  Questions have been answered.  All parties agreeable.   Virgel Manifold, MD  12/17/2018, 9:00 AM

## 2018-12-17 NOTE — Anesthesia Preprocedure Evaluation (Signed)
Anesthesia Evaluation  Patient identified by MRN, date of birth, ID band Patient awake    Reviewed: Allergy & Precautions, H&P , NPO status , Patient's Chart, lab work & pertinent test results, reviewed documented beta blocker date and time   Airway Mallampati: II   Neck ROM: full    Dental  (+) Poor Dentition   Pulmonary sleep apnea and Continuous Positive Airway Pressure Ventilation , former smoker,    Pulmonary exam normal        Cardiovascular Exercise Tolerance: Poor hypertension, On Medications negative cardio ROS Normal cardiovascular exam Rhythm:regular Rate:Normal     Neuro/Psych negative neurological ROS  negative psych ROS   GI/Hepatic Neg liver ROS, GERD  Medicated,  Endo/Other  diabetes, Well Controlled, Type 2, Insulin Dependent, Oral Hypoglycemic AgentsMorbid obesity  Renal/GU negative Renal ROS  negative genitourinary   Musculoskeletal   Abdominal   Peds  Hematology negative hematology ROS (+)   Anesthesia Other Findings Past Medical History: No date: Chickenpox No date: Chronic knee pain No date: Chronic low back pain No date: Essential hypertension No date: GERD (gastroesophageal reflux disease) No date: OSA (obstructive sleep apnea) No date: Testosterone deficiency No date: Type 2 diabetes mellitus ([place]) Past Surgical History: No date: JOINT REPLACEMENT; Bilateral 2015: REPLACEMENT TOTAL KNEE BILATERAL 1960: TONSILLECTOMY BMI    Body Mass Index:  43.67 kg/m     Reproductive/Obstetrics negative OB ROS                             Anesthesia Physical Anesthesia Plan  ASA: III  Anesthesia Plan: General   Post-op Pain Management:    Induction:   PONV Risk Score and Plan:   Airway Management Planned:   Additional Equipment:   Intra-op Plan:   Post-operative Plan:   Informed Consent: I have reviewed the patients History and Physical, chart, labs and  discussed the procedure including the risks, benefits and alternatives for the proposed anesthesia with the patient or authorized representative who has indicated his/her understanding and acceptance.   Dental Advisory Given  Plan Discussed with: CRNA  Anesthesia Plan Comments:         Anesthesia Quick Evaluation

## 2018-12-17 NOTE — Anesthesia Post-op Follow-up Note (Signed)
Anesthesia QCDR form completed.        

## 2018-12-20 LAB — SURGICAL PATHOLOGY

## 2018-12-21 ENCOUNTER — Other Ambulatory Visit: Payer: Self-pay | Admitting: Primary Care

## 2018-12-21 ENCOUNTER — Encounter: Payer: Self-pay | Admitting: Gastroenterology

## 2018-12-21 DIAGNOSIS — E119 Type 2 diabetes mellitus without complications: Secondary | ICD-10-CM

## 2018-12-21 NOTE — Telephone Encounter (Signed)
Noted.  Refill sent to pharmacy. 

## 2018-12-21 NOTE — Telephone Encounter (Signed)
Last prescribed on  11/11/2017 #180  with  1 refills. Last office visit on 11/16/2018. Next future appointment on 05/18/2019

## 2018-12-22 DIAGNOSIS — S39012A Strain of muscle, fascia and tendon of lower back, initial encounter: Secondary | ICD-10-CM | POA: Diagnosis not present

## 2018-12-22 DIAGNOSIS — M9903 Segmental and somatic dysfunction of lumbar region: Secondary | ICD-10-CM | POA: Diagnosis not present

## 2018-12-22 DIAGNOSIS — M9901 Segmental and somatic dysfunction of cervical region: Secondary | ICD-10-CM | POA: Diagnosis not present

## 2018-12-22 DIAGNOSIS — R293 Abnormal posture: Secondary | ICD-10-CM | POA: Diagnosis not present

## 2018-12-22 DIAGNOSIS — M9902 Segmental and somatic dysfunction of thoracic region: Secondary | ICD-10-CM | POA: Diagnosis not present

## 2018-12-22 DIAGNOSIS — M62830 Muscle spasm of back: Secondary | ICD-10-CM | POA: Diagnosis not present

## 2018-12-23 DIAGNOSIS — M9902 Segmental and somatic dysfunction of thoracic region: Secondary | ICD-10-CM | POA: Diagnosis not present

## 2018-12-23 DIAGNOSIS — S39012A Strain of muscle, fascia and tendon of lower back, initial encounter: Secondary | ICD-10-CM | POA: Diagnosis not present

## 2018-12-23 DIAGNOSIS — M9903 Segmental and somatic dysfunction of lumbar region: Secondary | ICD-10-CM | POA: Diagnosis not present

## 2018-12-23 DIAGNOSIS — R293 Abnormal posture: Secondary | ICD-10-CM | POA: Diagnosis not present

## 2018-12-23 DIAGNOSIS — M62830 Muscle spasm of back: Secondary | ICD-10-CM | POA: Diagnosis not present

## 2018-12-23 DIAGNOSIS — M9901 Segmental and somatic dysfunction of cervical region: Secondary | ICD-10-CM | POA: Diagnosis not present

## 2018-12-24 DIAGNOSIS — M62830 Muscle spasm of back: Secondary | ICD-10-CM | POA: Diagnosis not present

## 2018-12-24 DIAGNOSIS — S39012A Strain of muscle, fascia and tendon of lower back, initial encounter: Secondary | ICD-10-CM | POA: Diagnosis not present

## 2018-12-24 DIAGNOSIS — M9901 Segmental and somatic dysfunction of cervical region: Secondary | ICD-10-CM | POA: Diagnosis not present

## 2018-12-24 DIAGNOSIS — M9903 Segmental and somatic dysfunction of lumbar region: Secondary | ICD-10-CM | POA: Diagnosis not present

## 2018-12-24 DIAGNOSIS — M9902 Segmental and somatic dysfunction of thoracic region: Secondary | ICD-10-CM | POA: Diagnosis not present

## 2018-12-24 DIAGNOSIS — R293 Abnormal posture: Secondary | ICD-10-CM | POA: Diagnosis not present

## 2018-12-24 NOTE — Anesthesia Postprocedure Evaluation (Signed)
Anesthesia Post Note  Patient: Adam Wise  Procedure(s) Performed: COLONOSCOPY WITH PROPOFOL (N/A )  Patient location during evaluation: PACU Anesthesia Type: General Level of consciousness: awake and alert Pain management: pain level controlled Vital Signs Assessment: post-procedure vital signs reviewed and stable Respiratory status: spontaneous breathing, nonlabored ventilation, respiratory function stable and patient connected to nasal cannula oxygen Cardiovascular status: blood pressure returned to baseline and stable Postop Assessment: no apparent nausea or vomiting Anesthetic complications: no     Last Vitals:  Vitals:   12/17/18 1000 12/17/18 1010  BP: 98/60 112/67  Pulse: 97 86  Resp: 20 (!) 26  Temp:    SpO2: 94% 96%    Last Pain:  Vitals:   12/17/18 1010  TempSrc:   PainSc: 0-No pain                 Molli Barrows

## 2018-12-27 DIAGNOSIS — M9901 Segmental and somatic dysfunction of cervical region: Secondary | ICD-10-CM | POA: Diagnosis not present

## 2018-12-27 DIAGNOSIS — R293 Abnormal posture: Secondary | ICD-10-CM | POA: Diagnosis not present

## 2018-12-27 DIAGNOSIS — M9903 Segmental and somatic dysfunction of lumbar region: Secondary | ICD-10-CM | POA: Diagnosis not present

## 2018-12-27 DIAGNOSIS — M62830 Muscle spasm of back: Secondary | ICD-10-CM | POA: Diagnosis not present

## 2018-12-27 DIAGNOSIS — M9902 Segmental and somatic dysfunction of thoracic region: Secondary | ICD-10-CM | POA: Diagnosis not present

## 2018-12-27 DIAGNOSIS — S39012A Strain of muscle, fascia and tendon of lower back, initial encounter: Secondary | ICD-10-CM | POA: Diagnosis not present

## 2018-12-29 DIAGNOSIS — M62830 Muscle spasm of back: Secondary | ICD-10-CM | POA: Diagnosis not present

## 2018-12-29 DIAGNOSIS — M9902 Segmental and somatic dysfunction of thoracic region: Secondary | ICD-10-CM | POA: Diagnosis not present

## 2018-12-29 DIAGNOSIS — M9903 Segmental and somatic dysfunction of lumbar region: Secondary | ICD-10-CM | POA: Diagnosis not present

## 2018-12-29 DIAGNOSIS — R293 Abnormal posture: Secondary | ICD-10-CM | POA: Diagnosis not present

## 2018-12-29 DIAGNOSIS — M9901 Segmental and somatic dysfunction of cervical region: Secondary | ICD-10-CM | POA: Diagnosis not present

## 2018-12-29 DIAGNOSIS — S39012A Strain of muscle, fascia and tendon of lower back, initial encounter: Secondary | ICD-10-CM | POA: Diagnosis not present

## 2018-12-30 DIAGNOSIS — M9901 Segmental and somatic dysfunction of cervical region: Secondary | ICD-10-CM | POA: Diagnosis not present

## 2018-12-30 DIAGNOSIS — R293 Abnormal posture: Secondary | ICD-10-CM | POA: Diagnosis not present

## 2018-12-30 DIAGNOSIS — M62830 Muscle spasm of back: Secondary | ICD-10-CM | POA: Diagnosis not present

## 2018-12-30 DIAGNOSIS — M9903 Segmental and somatic dysfunction of lumbar region: Secondary | ICD-10-CM | POA: Diagnosis not present

## 2018-12-30 DIAGNOSIS — S39012A Strain of muscle, fascia and tendon of lower back, initial encounter: Secondary | ICD-10-CM | POA: Diagnosis not present

## 2018-12-30 DIAGNOSIS — M9902 Segmental and somatic dysfunction of thoracic region: Secondary | ICD-10-CM | POA: Diagnosis not present

## 2019-01-03 DIAGNOSIS — M9901 Segmental and somatic dysfunction of cervical region: Secondary | ICD-10-CM | POA: Diagnosis not present

## 2019-01-03 DIAGNOSIS — S39012A Strain of muscle, fascia and tendon of lower back, initial encounter: Secondary | ICD-10-CM | POA: Diagnosis not present

## 2019-01-03 DIAGNOSIS — M9903 Segmental and somatic dysfunction of lumbar region: Secondary | ICD-10-CM | POA: Diagnosis not present

## 2019-01-03 DIAGNOSIS — M9902 Segmental and somatic dysfunction of thoracic region: Secondary | ICD-10-CM | POA: Diagnosis not present

## 2019-01-03 DIAGNOSIS — M62830 Muscle spasm of back: Secondary | ICD-10-CM | POA: Diagnosis not present

## 2019-01-03 DIAGNOSIS — R293 Abnormal posture: Secondary | ICD-10-CM | POA: Diagnosis not present

## 2019-01-05 DIAGNOSIS — S39012A Strain of muscle, fascia and tendon of lower back, initial encounter: Secondary | ICD-10-CM | POA: Diagnosis not present

## 2019-01-05 DIAGNOSIS — R293 Abnormal posture: Secondary | ICD-10-CM | POA: Diagnosis not present

## 2019-01-05 DIAGNOSIS — M9901 Segmental and somatic dysfunction of cervical region: Secondary | ICD-10-CM | POA: Diagnosis not present

## 2019-01-05 DIAGNOSIS — M62830 Muscle spasm of back: Secondary | ICD-10-CM | POA: Diagnosis not present

## 2019-01-05 DIAGNOSIS — M9902 Segmental and somatic dysfunction of thoracic region: Secondary | ICD-10-CM | POA: Diagnosis not present

## 2019-01-05 DIAGNOSIS — M9903 Segmental and somatic dysfunction of lumbar region: Secondary | ICD-10-CM | POA: Diagnosis not present

## 2019-01-10 DIAGNOSIS — R293 Abnormal posture: Secondary | ICD-10-CM | POA: Diagnosis not present

## 2019-01-10 DIAGNOSIS — M9901 Segmental and somatic dysfunction of cervical region: Secondary | ICD-10-CM | POA: Diagnosis not present

## 2019-01-10 DIAGNOSIS — M9902 Segmental and somatic dysfunction of thoracic region: Secondary | ICD-10-CM | POA: Diagnosis not present

## 2019-01-10 DIAGNOSIS — M62830 Muscle spasm of back: Secondary | ICD-10-CM | POA: Diagnosis not present

## 2019-01-10 DIAGNOSIS — S39012A Strain of muscle, fascia and tendon of lower back, initial encounter: Secondary | ICD-10-CM | POA: Diagnosis not present

## 2019-01-10 DIAGNOSIS — M9903 Segmental and somatic dysfunction of lumbar region: Secondary | ICD-10-CM | POA: Diagnosis not present

## 2019-01-13 DIAGNOSIS — M9903 Segmental and somatic dysfunction of lumbar region: Secondary | ICD-10-CM | POA: Diagnosis not present

## 2019-01-13 DIAGNOSIS — R293 Abnormal posture: Secondary | ICD-10-CM | POA: Diagnosis not present

## 2019-01-13 DIAGNOSIS — M62830 Muscle spasm of back: Secondary | ICD-10-CM | POA: Diagnosis not present

## 2019-01-13 DIAGNOSIS — S39012A Strain of muscle, fascia and tendon of lower back, initial encounter: Secondary | ICD-10-CM | POA: Diagnosis not present

## 2019-01-13 DIAGNOSIS — M9902 Segmental and somatic dysfunction of thoracic region: Secondary | ICD-10-CM | POA: Diagnosis not present

## 2019-01-13 DIAGNOSIS — M9901 Segmental and somatic dysfunction of cervical region: Secondary | ICD-10-CM | POA: Diagnosis not present

## 2019-01-18 DIAGNOSIS — M9902 Segmental and somatic dysfunction of thoracic region: Secondary | ICD-10-CM | POA: Diagnosis not present

## 2019-01-18 DIAGNOSIS — R293 Abnormal posture: Secondary | ICD-10-CM | POA: Diagnosis not present

## 2019-01-18 DIAGNOSIS — M62830 Muscle spasm of back: Secondary | ICD-10-CM | POA: Diagnosis not present

## 2019-01-18 DIAGNOSIS — S39012A Strain of muscle, fascia and tendon of lower back, initial encounter: Secondary | ICD-10-CM | POA: Diagnosis not present

## 2019-01-18 DIAGNOSIS — M9901 Segmental and somatic dysfunction of cervical region: Secondary | ICD-10-CM | POA: Diagnosis not present

## 2019-01-18 DIAGNOSIS — M9903 Segmental and somatic dysfunction of lumbar region: Secondary | ICD-10-CM | POA: Diagnosis not present

## 2019-01-24 DIAGNOSIS — M62830 Muscle spasm of back: Secondary | ICD-10-CM | POA: Diagnosis not present

## 2019-01-24 DIAGNOSIS — M9901 Segmental and somatic dysfunction of cervical region: Secondary | ICD-10-CM | POA: Diagnosis not present

## 2019-01-24 DIAGNOSIS — S39012A Strain of muscle, fascia and tendon of lower back, initial encounter: Secondary | ICD-10-CM | POA: Diagnosis not present

## 2019-01-24 DIAGNOSIS — M9902 Segmental and somatic dysfunction of thoracic region: Secondary | ICD-10-CM | POA: Diagnosis not present

## 2019-01-24 DIAGNOSIS — M9903 Segmental and somatic dysfunction of lumbar region: Secondary | ICD-10-CM | POA: Diagnosis not present

## 2019-01-24 DIAGNOSIS — R293 Abnormal posture: Secondary | ICD-10-CM | POA: Diagnosis not present

## 2019-01-25 DIAGNOSIS — S39012A Strain of muscle, fascia and tendon of lower back, initial encounter: Secondary | ICD-10-CM | POA: Diagnosis not present

## 2019-01-25 DIAGNOSIS — R293 Abnormal posture: Secondary | ICD-10-CM | POA: Diagnosis not present

## 2019-01-25 DIAGNOSIS — M9901 Segmental and somatic dysfunction of cervical region: Secondary | ICD-10-CM | POA: Diagnosis not present

## 2019-01-25 DIAGNOSIS — M62830 Muscle spasm of back: Secondary | ICD-10-CM | POA: Diagnosis not present

## 2019-01-25 DIAGNOSIS — M9902 Segmental and somatic dysfunction of thoracic region: Secondary | ICD-10-CM | POA: Diagnosis not present

## 2019-01-25 DIAGNOSIS — M9903 Segmental and somatic dysfunction of lumbar region: Secondary | ICD-10-CM | POA: Diagnosis not present

## 2019-02-01 DIAGNOSIS — M9902 Segmental and somatic dysfunction of thoracic region: Secondary | ICD-10-CM | POA: Diagnosis not present

## 2019-02-01 DIAGNOSIS — R293 Abnormal posture: Secondary | ICD-10-CM | POA: Diagnosis not present

## 2019-02-01 DIAGNOSIS — M62830 Muscle spasm of back: Secondary | ICD-10-CM | POA: Diagnosis not present

## 2019-02-01 DIAGNOSIS — M9903 Segmental and somatic dysfunction of lumbar region: Secondary | ICD-10-CM | POA: Diagnosis not present

## 2019-02-01 DIAGNOSIS — S39012A Strain of muscle, fascia and tendon of lower back, initial encounter: Secondary | ICD-10-CM | POA: Diagnosis not present

## 2019-02-01 DIAGNOSIS — M9901 Segmental and somatic dysfunction of cervical region: Secondary | ICD-10-CM | POA: Diagnosis not present

## 2019-02-02 ENCOUNTER — Other Ambulatory Visit: Payer: Self-pay | Admitting: Primary Care

## 2019-02-02 DIAGNOSIS — E119 Type 2 diabetes mellitus without complications: Secondary | ICD-10-CM

## 2019-02-08 ENCOUNTER — Other Ambulatory Visit (HOSPITAL_COMMUNITY): Payer: Self-pay | Admitting: Orthopedic Surgery

## 2019-02-08 ENCOUNTER — Other Ambulatory Visit: Payer: Self-pay | Admitting: Orthopedic Surgery

## 2019-02-08 DIAGNOSIS — M4316 Spondylolisthesis, lumbar region: Secondary | ICD-10-CM

## 2019-02-08 DIAGNOSIS — M5136 Other intervertebral disc degeneration, lumbar region: Secondary | ICD-10-CM

## 2019-02-08 DIAGNOSIS — M5442 Lumbago with sciatica, left side: Secondary | ICD-10-CM | POA: Diagnosis not present

## 2019-02-08 DIAGNOSIS — M5441 Lumbago with sciatica, right side: Secondary | ICD-10-CM

## 2019-02-08 DIAGNOSIS — M545 Low back pain: Secondary | ICD-10-CM | POA: Diagnosis not present

## 2019-02-08 DIAGNOSIS — S22080A Wedge compression fracture of T11-T12 vertebra, initial encounter for closed fracture: Secondary | ICD-10-CM | POA: Diagnosis not present

## 2019-02-15 ENCOUNTER — Other Ambulatory Visit (INDEPENDENT_AMBULATORY_CARE_PROVIDER_SITE_OTHER): Payer: Medicare Other

## 2019-02-15 DIAGNOSIS — M2540 Effusion, unspecified joint: Secondary | ICD-10-CM | POA: Diagnosis not present

## 2019-02-15 DIAGNOSIS — E119 Type 2 diabetes mellitus without complications: Secondary | ICD-10-CM | POA: Diagnosis not present

## 2019-02-15 LAB — URIC ACID: Uric Acid, Serum: 5.2 mg/dL (ref 4.0–7.8)

## 2019-02-15 LAB — HEMOGLOBIN A1C: HEMOGLOBIN A1C: 6.4 % (ref 4.6–6.5)

## 2019-02-17 ENCOUNTER — Other Ambulatory Visit: Payer: Self-pay

## 2019-02-17 ENCOUNTER — Ambulatory Visit
Admission: RE | Admit: 2019-02-17 | Discharge: 2019-02-17 | Disposition: A | Discharge status: Home / Self Care | Payer: Medicare Other | Source: Ambulatory Visit | Attending: Orthopedic Surgery | Admitting: Orthopedic Surgery

## 2019-02-17 DIAGNOSIS — M5136 Other intervertebral disc degeneration, lumbar region: Secondary | ICD-10-CM

## 2019-02-17 DIAGNOSIS — S22080A Wedge compression fracture of T11-T12 vertebra, initial encounter for closed fracture: Secondary | ICD-10-CM

## 2019-02-17 DIAGNOSIS — M5441 Lumbago with sciatica, right side: Secondary | ICD-10-CM

## 2019-02-17 DIAGNOSIS — M4316 Spondylolisthesis, lumbar region: Secondary | ICD-10-CM

## 2019-02-17 DIAGNOSIS — M5442 Lumbago with sciatica, left side: Principal | ICD-10-CM

## 2019-02-21 ENCOUNTER — Ambulatory Visit
Admission: RE | Admit: 2019-02-21 | Discharge: 2019-02-21 | Disposition: A | Discharge status: Home / Self Care | Payer: Medicare Other | Source: Ambulatory Visit | Attending: Orthopedic Surgery | Admitting: Orthopedic Surgery

## 2019-02-21 DIAGNOSIS — M5136 Other intervertebral disc degeneration, lumbar region: Secondary | ICD-10-CM | POA: Diagnosis not present

## 2019-02-21 DIAGNOSIS — M5442 Lumbago with sciatica, left side: Secondary | ICD-10-CM | POA: Insufficient documentation

## 2019-02-21 DIAGNOSIS — M4316 Spondylolisthesis, lumbar region: Secondary | ICD-10-CM | POA: Diagnosis not present

## 2019-02-21 DIAGNOSIS — M545 Low back pain: Secondary | ICD-10-CM | POA: Diagnosis not present

## 2019-02-21 DIAGNOSIS — S22080A Wedge compression fracture of T11-T12 vertebra, initial encounter for closed fracture: Secondary | ICD-10-CM | POA: Insufficient documentation

## 2019-02-21 DIAGNOSIS — M5441 Lumbago with sciatica, right side: Secondary | ICD-10-CM | POA: Diagnosis not present

## 2019-02-23 ENCOUNTER — Other Ambulatory Visit: Payer: Self-pay | Admitting: Primary Care

## 2019-02-23 DIAGNOSIS — E1169 Type 2 diabetes mellitus with other specified complication: Secondary | ICD-10-CM

## 2019-02-23 DIAGNOSIS — I10 Essential (primary) hypertension: Secondary | ICD-10-CM

## 2019-03-09 ENCOUNTER — Other Ambulatory Visit: Payer: Self-pay | Admitting: Primary Care

## 2019-04-01 DIAGNOSIS — R7989 Other specified abnormal findings of blood chemistry: Secondary | ICD-10-CM | POA: Diagnosis not present

## 2019-04-01 DIAGNOSIS — E236 Other disorders of pituitary gland: Secondary | ICD-10-CM | POA: Diagnosis not present

## 2019-04-01 DIAGNOSIS — R948 Abnormal results of function studies of other organs and systems: Secondary | ICD-10-CM | POA: Diagnosis not present

## 2019-04-15 ENCOUNTER — Other Ambulatory Visit: Payer: Self-pay | Admitting: Primary Care

## 2019-04-15 DIAGNOSIS — E119 Type 2 diabetes mellitus without complications: Secondary | ICD-10-CM

## 2019-04-22 DIAGNOSIS — M48062 Spinal stenosis, lumbar region with neurogenic claudication: Secondary | ICD-10-CM | POA: Diagnosis not present

## 2019-04-22 DIAGNOSIS — M5416 Radiculopathy, lumbar region: Secondary | ICD-10-CM | POA: Diagnosis not present

## 2019-04-22 DIAGNOSIS — M5136 Other intervertebral disc degeneration, lumbar region: Secondary | ICD-10-CM | POA: Diagnosis not present

## 2019-05-18 ENCOUNTER — Ambulatory Visit: Payer: Medicare Other | Admitting: Primary Care

## 2019-05-20 DIAGNOSIS — M48062 Spinal stenosis, lumbar region with neurogenic claudication: Secondary | ICD-10-CM | POA: Diagnosis not present

## 2019-05-20 DIAGNOSIS — M5136 Other intervertebral disc degeneration, lumbar region: Secondary | ICD-10-CM | POA: Diagnosis not present

## 2019-05-20 DIAGNOSIS — M5416 Radiculopathy, lumbar region: Secondary | ICD-10-CM | POA: Diagnosis not present

## 2019-05-24 ENCOUNTER — Ambulatory Visit (INDEPENDENT_AMBULATORY_CARE_PROVIDER_SITE_OTHER): Payer: Medicare Other | Admitting: Primary Care

## 2019-05-24 ENCOUNTER — Encounter: Payer: Self-pay | Admitting: Primary Care

## 2019-05-24 DIAGNOSIS — E119 Type 2 diabetes mellitus without complications: Secondary | ICD-10-CM

## 2019-05-24 NOTE — Patient Instructions (Signed)
Continue taking all medications as prescribed.  Continue to monitor your blood sugars.  We will plan to see you in November 2020 as scheduled.   It was a pleasure to see you today! Allie Bossier, NP-C

## 2019-05-24 NOTE — Assessment & Plan Note (Addendum)
Well controlled from labs in March 2020. Glucose readings seem controlled with highest reading being 153.  Continue current oral regimen. Managed on statin and ACE. Foot exam next visit. Pneumonia vaccination UTD.  We will plan to see him back in November 2020 as scheduled. Discussed to monitor glucose readings and report readings that are consistently at or above 150.

## 2019-05-24 NOTE — Progress Notes (Signed)
Subjective:    Patient ID: Adam Wise, male    DOB: September 24, 1951, 68 y.o.   MRN: [id number]  HPI  Virtual Visit via Video Note  I connected with Adam Wise on 05/24/19 at  2:20 PM EDT by a video enabled telemedicine application and verified that I am speaking with the correct person using two identifiers.  Location: Patient: Home Provider: Office   I discussed the limitations of evaluation and management by telemedicine and the availability of in person appointments. The patient expressed understanding and agreed to proceed.  History of Present Illness:  Adam Wise is a 68 year old male who presents today for follow up of diabetes.  Current medications include:    He is checking his blood glucose 1  times daily and is getting readings of:  AM fasting: 110's-140's  Highest reading: 153   Last A1C: 6.4 in March 2020 Last Eye Exam: Will schedule Last Foot Exam: Due next visit Pneumonia Vaccination: Completed in 2019 ACE/ARB: ACE Statin: Pravastatin  He has been working on his diet by limiting bread, sugars, milk. He drinks mostly water now. He is not exercising. He endorses a 20 pound weight loss since his last visit here.    Observations/Objective:  Alert and oriented. Appears well, not sickly. No distress. Speaking in complete sentences.   Assessment and Plan:  See problem based charting.   Follow Up Instructions:  Continue taking all medications as prescribed.  Continue to monitor your blood sugars.  We will plan to see you in November 2020 as scheduled.   It was a pleasure to see you today! Allie Bossier, NP-C    I discussed the assessment and treatment plan with the patient. The patient was provided an opportunity to ask questions and all were answered. The patient agreed with the plan and demonstrated an understanding of the instructions.   The patient was advised to call back or seek an in-person evaluation if the symptoms worsen or if the condition  fails to improve as anticipated.     [name], NP    Review of Systems  Eyes: Negative for visual disturbance.  Respiratory: Negative for shortness of breath.   Cardiovascular: Negative for chest pain.  Musculoskeletal: Positive for back pain.  Neurological: Negative for dizziness.       Past Medical History:  Diagnosis Date  . Chickenpox   . Chronic knee pain   . Chronic low back pain   . Essential hypertension   . GERD (gastroesophageal reflux disease)   . OSA (obstructive sleep apnea)   . Testosterone deficiency   . Type 2 diabetes mellitus ([place])      Social History   Socioeconomic History  . Marital status: Married    Spouse name: Not on file  . Number of children: Not on file  . Years of education: Not on file  . Highest education level: Not on file  Occupational History  . Not on file  Social Needs  . Financial resource strain: Not on file  . Food insecurity:    Worry: Not on file    Inability: Not on file  . Transportation needs:    Medical: Not on file    Non-medical: Not on file  Tobacco Use  . Smoking status: Former Research scientist (life sciences)  . Smokeless tobacco: Never Used  Substance and Sexual Activity  . Alcohol use: Yes    Comment: rarely  . Drug use: Not Currently  . Sexual activity: Yes  Lifestyle  .  Physical activity:    Days per week: Not on file    Minutes per session: Not on file  . Stress: Not on file  Relationships  . Social connections:    Talks on phone: Not on file    Gets together: Not on file    Attends religious service: Not on file    Active member of club or organization: Not on file    Attends meetings of clubs or organizations: Not on file    Relationship status: Not on file  . Intimate partner violence:    Fear of current or ex partner: Not on file    Emotionally abused: Not on file    Physically abused: Not on file    Forced sexual activity: Not on file  Other Topics Concern  . Not on file  Social History Narrative    Married.   Moved from [place].   Retired.    Past Surgical History:  Procedure Laterality Date  . COLONOSCOPY WITH PROPOFOL N/A 12/17/2018   Procedure: COLONOSCOPY WITH PROPOFOL;  Surgeon: Virgel Manifold, MD;  Location: [facility] ENDOSCOPY;  Service: Gastroenterology;  Laterality: N/A;  . JOINT REPLACEMENT Bilateral   . REPLACEMENT TOTAL KNEE BILATERAL  2015  . TONSILLECTOMY  1960    Family History  Problem Relation Age of Onset  . Cancer Mother   . Hypertension Mother   . Arthritis Father   . Asthma Father   . Cancer Father   . COPD Father   . Heart attack Father   . Hypertension Sister   . Cancer Sister   . Diabetes Sister   . Asthma Son   . Birth defects Maternal Grandfather   . Arthritis Paternal Grandmother   . Diabetes Paternal Grandmother   . Arthritis Paternal Grandfather   . Asthma Sister   . Cancer Sister   . COPD Sister   . Arthritis Sister   . Asthma Sister   . Diabetes Sister   . Other Neg Hx        pituitary disorder    Allergies  Allergen Reactions  . Bupropion     Racing heart    Current Outpatient Medications on File Prior to Visit  Medication Sig Dispense Refill  . amLODipine (NORVASC) 10 MG tablet TAKE 1 TABLET BY MOUTH EVERY DAY 90 tablet 1  . aspirin EC 81 MG tablet Take by mouth.    . blood glucose meter kit and supplies KIT Dispense based on patient and insurance preference. Use up to four times daily as directed. 1 each 0  . Coenzyme Q10 (COQ10 PO) Take 300 mg by mouth daily.    Marland Kitchen esomeprazole (NEXIUM) 20 MG capsule Take 20 mg by mouth daily at 12 noon.    . fluticasone (FLONASE) 50 MCG/ACT nasal spray SPRAY 2 TIMES INTO EACH NOSTRIL DAILY AS DIRECTED    . glipiZIDE (GLUCOTROL) 10 MG tablet Take 1 tablet (10 mg total) by mouth 2 (two) times daily before a meal. For diabetes. 180 tablet 3  . KRILL OIL PO Take 350 mg by mouth daily.    . Lancets MISC USE UP TO 3 TIMES DAILY AS DIRECTED 100 each 2  . lisinopril (PRINIVIL,ZESTRIL) 5 MG tablet  TAKE 1 TABLET BY MOUTH EVERY DAY 90 tablet 1  . metFORMIN (GLUCOPHAGE) 1000 MG tablet TAKE 1 TABLET (1,000 MG TOTAL) BY MOUTH 2 (TWO) TIMES DAILY WITH A MEAL. 180 tablet 1  . Multiple Vitamin (MULTI-VITAMINS) TABS Take 1 tablet by mouth daily.     Marland Kitchen  ONE TOUCH ULTRA TEST test strip USE UP TO 4 TIMES DAILY AS DIRECTED 100 each 2  . pioglitazone (ACTOS) 45 MG tablet TAKE 1 TABLET BY MOUTH EVERY DAY 90 tablet 1  . pravastatin (PRAVACHOL) 40 MG tablet Take 1 tablet by mouth every evening for cholesterol. 90 tablet 3  . sildenafil (VIAGRA) 50 MG tablet TAKE 1 TABLET BY MOUTH EVERY DAY AS NEEDED 12 tablet 1  . SYRINGE DISPOSABLE 3CC 3 ML MISC USE AS INSTRUCTED FOR INJECTION 100 each 2  . testosterone cypionate (DEPOTESTOSTERONE CYPIONATE) 200 MG/ML injection Inject 200 mg into the muscle every 14 (fourteen) days.     No current facility-administered medications on file prior to visit.     Wt 288 lb (130.6 kg)   BMI 41.92 kg/m    Objective:   Physical Exam  Constitutional: He is oriented to person, place, and time. He appears well-nourished.  Respiratory: Effort normal.  Neurological: He is alert and oriented to person, place, and time.  Psychiatric: He has a normal mood and affect.           Assessment & Plan:

## 2019-06-07 DIAGNOSIS — M48062 Spinal stenosis, lumbar region with neurogenic claudication: Secondary | ICD-10-CM | POA: Diagnosis not present

## 2019-06-07 DIAGNOSIS — M4726 Other spondylosis with radiculopathy, lumbar region: Secondary | ICD-10-CM | POA: Diagnosis not present

## 2019-06-07 DIAGNOSIS — M5136 Other intervertebral disc degeneration, lumbar region: Secondary | ICD-10-CM | POA: Diagnosis not present

## 2019-06-07 DIAGNOSIS — M5416 Radiculopathy, lumbar region: Secondary | ICD-10-CM | POA: Diagnosis not present

## 2019-06-09 DIAGNOSIS — M47816 Spondylosis without myelopathy or radiculopathy, lumbar region: Secondary | ICD-10-CM | POA: Diagnosis not present

## 2019-06-20 DIAGNOSIS — M48062 Spinal stenosis, lumbar region with neurogenic claudication: Secondary | ICD-10-CM | POA: Diagnosis not present

## 2019-06-20 DIAGNOSIS — M545 Low back pain: Secondary | ICD-10-CM | POA: Diagnosis not present

## 2019-06-20 DIAGNOSIS — M7918 Myalgia, other site: Secondary | ICD-10-CM | POA: Diagnosis not present

## 2019-06-20 DIAGNOSIS — M4726 Other spondylosis with radiculopathy, lumbar region: Secondary | ICD-10-CM | POA: Diagnosis not present

## 2019-06-20 DIAGNOSIS — M5416 Radiculopathy, lumbar region: Secondary | ICD-10-CM | POA: Diagnosis not present

## 2019-06-20 DIAGNOSIS — M5136 Other intervertebral disc degeneration, lumbar region: Secondary | ICD-10-CM | POA: Diagnosis not present

## 2019-06-21 DIAGNOSIS — M48061 Spinal stenosis, lumbar region without neurogenic claudication: Secondary | ICD-10-CM | POA: Diagnosis not present

## 2019-06-21 DIAGNOSIS — M4186 Other forms of scoliosis, lumbar region: Secondary | ICD-10-CM | POA: Diagnosis not present

## 2019-06-21 DIAGNOSIS — M5136 Other intervertebral disc degeneration, lumbar region: Secondary | ICD-10-CM | POA: Diagnosis not present

## 2019-06-28 ENCOUNTER — Telehealth: Payer: Self-pay | Admitting: Primary Care

## 2019-06-28 MED ORDER — "SYRINGE 22G X 1"" 3 ML MISC": 1 refills | Status: DC

## 2019-06-28 NOTE — Telephone Encounter (Signed)
Spoken to patient's wife and she sated that they never pick up the Rx in 01/2019.   Will sent the syringe for patient 22 gauge x 1" x 3 ML to [facility]

## 2019-06-28 NOTE — Telephone Encounter (Signed)
Pt's wife called to request a new medication for syringes for pt.  [id number] 1inch syringes sent to [facility] in [place]. Pt uses 2 per month.

## 2019-06-28 NOTE — Telephone Encounter (Signed)
It looks like we sent in 100 with 2 refills in February 2020 so if he's using two per month then he should have plenty. Will you take a look?

## 2019-07-21 ENCOUNTER — Ambulatory Visit: Payer: Medicare Other | Attending: Family Medicine

## 2019-07-21 ENCOUNTER — Other Ambulatory Visit: Payer: Self-pay

## 2019-07-21 DIAGNOSIS — M545 Low back pain, unspecified: Secondary | ICD-10-CM

## 2019-07-21 DIAGNOSIS — G8929 Other chronic pain: Secondary | ICD-10-CM | POA: Diagnosis not present

## 2019-07-21 DIAGNOSIS — M6281 Muscle weakness (generalized): Secondary | ICD-10-CM

## 2019-07-21 NOTE — Therapy (Signed)
[place] MAIN [facility] SERVICES [address] [place], [place], [address] Phone: [phone]   Fax:  [phone]  Physical Therapy Evaluation  Patient Details  Name: Adam Wise MRN: [id number] Date of Birth: 1951-03-16 No data recorded  Encounter Date: 07/21/2019  PT End of Session - 07/21/19 1253    Visit Number  1    Number of Visits  16    Date for PT Re-Evaluation  09/20/19    Authorization Type  Medicare    Authorization Time Period  07/21/19-09/20/19    Authorization - Visit Number  1    Authorization - Number of Visits  10    PT Start Time  1104    PT Stop Time  1210    PT Time Calculation (min)  66 min    Activity Tolerance  Patient tolerated treatment well;Patient limited by pain    Behavior During Therapy  [facility] for tasks assessed/performed       Past Medical History:  Diagnosis Date  . Chickenpox   . Chronic knee pain   . Chronic low back pain   . Essential hypertension   . GERD (gastroesophageal reflux disease)   . OSA (obstructive sleep apnea)   . Testosterone deficiency   . Type 2 diabetes mellitus ([place])     Past Surgical History:  Procedure Laterality Date  . COLONOSCOPY WITH PROPOFOL N/A 12/17/2018   Procedure: COLONOSCOPY WITH PROPOFOL;  Surgeon: Virgel Manifold, MD;  Location: [facility] ENDOSCOPY;  Service: Gastroenterology;  Laterality: N/A;  . JOINT REPLACEMENT Bilateral   . REPLACEMENT TOTAL KNEE BILATERAL  2015  . TONSILLECTOMY  1960    There were no vitals filed for this visit.   Subjective Assessment - 07/21/19 1124    Subjective  Pt presents with for evaluation of low back pain. Referral from physiatrist.    Pertinent History  Pt suspects a 3-4 days after working out at [employer], pt started having severe "sharp" pain at the top of his gluteal cleft. He assumed it was his back, started going to chiro, initally better, but essentially unchanged, persisted for 2x/week for about 6 weeks. Pt eventually was  referred to DC, pt given a variety of pain medications (tramadol, 'oxy', flexaril), none of which helped.  Eventually saw the physiatrist who was questioning injections and/or ablasion. Pt also saw neurology Dr. Lacinda Axon who says patient has no operable issues as his diagnostic injections down to L5/S1 were higher than symptomatic level.    Limitations  Lifting;Standing;House hold activities    Currently in Pain?  Yes    Pain Score  4     Pain Location  --   near top of butt crack   Pain Descriptors / Indicators  Sharp    Pain Type  Chronic pain    Pain Onset  --   Feb 2020   Pain Frequency  Constant    Aggravating Factors   Standing immediately aggravated.    Pain Relieving Factors  sitting, lying on side or on back, TENS, gabapentin         [facility] PT Assessment - 07/21/19 0001      Assessment   Medical Diagnosis  Low Back Pain   (Pended)     Referring Provider (PT)  Loree Fee Meeler, FNP  (Pended)     Onset Date/Surgical Date  --  (Pended)    Feb 2020   Hand Dominance  Left  (Pended)     Prior Therapy  --  (  Pended)    chiropractic     Balance Screen   Has the patient fallen in the past 6 months  No  (Pended)     Has the patient had a decrease in activity level because of a fear of falling?   No  (Pended)     Is the patient reluctant to leave their home because of a fear of falling?   No  (Pended)       Home Environment   Living Environment  Private residence  (Pended)     Living Arrangements  Spouse/significant other  (Pended)     Available Help at Discharge  Family  (Pended)     Type of [place]  (Pended)     Home Access  Stairs to enter  (Pended)     Entrance Stairs-Number of Steps  5  (Pended)     Entrance Stairs-Rails  Cannot reach both;Right;Left  (Pended)     Home Layout  One level  (Pended)     Home Equipment  None  (Pended)       Prior Function   Level of Independence  Independent  (Pended)     Vocation  Retired  Museum/gallery conservator)       Cognition   Overall Cognitive  Status  Within Functional Limits for tasks assessed  (Pended)       EXAMINATION   Light Touch Sensation: -L5/S1/S2 WNL bilat   Strength/MMT -Hip flexion seated, horizontal abduction robust 5/5 (no pain) -Resisted Dorsiflexion 5/5 bilat -Resisted Platar flexion (in closed chain seated) 5/5 bilat with cramping in bilat calves *history of frequent cramping in groin but not calves.   "Special" Tests Slump test sciatic tension on Right with dorsiflexion, no worse with cervical flexion. on Left, no sciatic tension until dorsiflexion, and cervical flexion creates back pain.   Passive Straight Leg Test:  -Right sciatic tension at 45 degrees, worse wth DF, no differen twith hea dmovmenet -Left sciatic tension at 60 degrees or lower with DF, no back pain - tension in hamstrings and calf.   Lumbar Screening:  Seated Flexion to floor: Slow, guarded but tolerated generally well; symptoms stable, maintains slight lordosis, palpation about the spinous processes from L1 - S1 non tender.  -Seated extension with pain at end-range of minimal movement., but no significant exacerbation of resting symptoms.  *of note, pt reports historically not tolerating flexion to floor from a standing position, which is not surprising given limitations of sciatic neurodynamics in this examination. Pt encouraged to DC flexion to floor from standing, and to DC hamstring stretches in supine.   Repeated Flexion in Sitting:  -no peripheralization or centralization phenomenon, no exacerbation of symptoms; improved gross motor planning with repitition.   Leg Traction in hook lying 1x60sec Bilat: -painful on Right side, pain worsening -feels somewhat good on Left Leg (careful with release)  *cavitation with each side  Other *Large diastasis rectus abdominus noted, likely chronic  -DTR at knee jerk and ankle jerk both deferred until later date  Education on use of TENS, no longer than 40-45 minute per bout, take  breaks between bouts. Bring unit in for inspection and education on use of modulating current to prevent CNS accomodation.    Objective measurements completed on examination: See above findings.     PT Short Term Goals - 07/21/19 1500      PT SHORT TERM GOAL #1   Title  Patient will be independent in home exercise program to improve strength/mobility for better  functional independence with ADLs.    Time  4    Period  Weeks    Status  New    Target Date  08/21/19      PT SHORT TERM GOAL #2   Title  After 4 weeks pt wil lreport consistet pain from sitting to standing up to 5 minutes without increaes.    Baseline  3-4 point increase from sitting to standing.    Time  4    Period  Weeks    Status  New    Target Date  08/21/19        PT Long Term Goals - 07/21/19 1501      PT LONG TERM GOAL #1   Title  Afte r8 weeks patient will improve walking tolerane to covering >1000 in 6MWT with LRAD.    Baseline  pain with limited community distance AMB    Time  8    Period  Weeks    Status  New    Target Date  09/20/19      PT LONG TERM GOAL #2   Title  After 8 weeks pt will report improved independence in self management of symptoms excluding use of medication.    Baseline  currently uses TENS, Gabapentin, or heat.    Time  8    Period  Weeks    Status  New    Target Date  09/20/19      PT LONG TERM GOAL #3   Title  After 8 weeks pt to demonstrate improved sciatic neurodynamics AEB PSLR >60 degrees bilat without subjective sciatic tension.    Baseline  45 degrees Right, 60 degrees left    Time  8    Period  Weeks    Status  New    Target Date  09/20/19             Plan - 07/21/19 1254    Clinical Impression Statement  Pt presenting to OPPT for Lumbar Spine Pain with neurogenic claudication.   Examination reveals pain worsening with extension based movement and postiioning of lumbar spine, whereas flexion oriented activity (bending, sitting, sleeping are generally the  best tolerate. Pt has no sciatic referral pain, but does have limited mobility of the sciatic nerves bilaterally, these limited neurdynamics limiting passive straight leg raise to 45 degrees on Right and ~60 degrees on right. Pt has chronic/rigid postural lumbar lordosis with limited capacity to return to neutral and no clear available flexion. Chronic Abdominal distension and diastasis rectus abdominus are both strongly correlated with loss of strength capacity and funciton of abdominal wall to control lumbar spine curvature actively during simple mobility tasks, hence neurogenic claudication appears to worsen with transition from sitting to standing without capacity to modify posture. Pt will benefit from skille dPT intervnetion to address deficits and impairment outlined in this evaluation, to improve postural control, reduce neurogenic claudication, and improve functional capacity for basic day to day actvity.    Personal Factors and Comorbidities  Behavior Pattern;Comorbidity 1;Past/Current Experience;Fitness;Time since onset of injury/illness/exacerbation    Comorbidities  Morbid Obesity    Examination-Activity Limitations  Bed Mobility;Squat;Stairs;Locomotion Level;Stand    Examination-Participation Restrictions  Shop;Community Activity;Yard Work;Meal Prep    Stability/Clinical Decision Making  Unstable/Unpredictable    Clinical Decision Making  Moderate    Clinical Presentation due to:  Limited surgical options, minimal relief from medication    Rehab Potential  Fair    Clinical Impairments Affecting Rehab Potential  (-) HTN, reflux, bilateral  TKA, diabetes, history of chronic back pain,recent move (+) good family support and understanding of medical field    PT Frequency  2x / week    PT Duration  8 weeks    PT Treatment/Interventions  ADLs/Self Care Home Management;Aquatic Therapy;Cryotherapy;Electrical Stimulation;Iontophoresis 4mg /ml Dexamethasone;Moist Heat;Traction;Ultrasound;Balance  training;Therapeutic exercise;Therapeutic activities;Functional mobility training;Stair training;Gait training;Neuromuscular re-education;Patient/family education;Manual techniques;Passive range of motion;Dry needling;Energy conservation;Taping    PT Next Visit Plan  FU on response to flexion stretches; explore abdominal strength for HEP; knee jerk and ankle jerk reflexes.    PT Home Exercise Plan  seated floor touch streth 3x60sec 2x daily.    Consulted and Agree with Plan of Care  Patient       Patient will benefit from skilled therapeutic intervention in order to improve the following deficits and impairments:  Abnormal gait, Decreased activity tolerance, Decreased balance, Decreased endurance, Decreased mobility, Decreased range of motion, Difficulty walking, Decreased strength, Hypomobility, Impaired flexibility, Impaired perceived functional ability, Increased muscle spasms, Postural dysfunction, Pain, Increased fascial restricitons, Improper body mechanics, Obesity  Visit Diagnosis: 1. Chronic midline low back pain without sciatica   2. Muscle weakness (generalized)        Problem List Patient Active Problem List   Diagnosis Date Noted  . Joint swelling 11/16/2018  . 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 Essential hypertension 09/09/2017  . Type 2 diabetes mellitus ([place]) 09/09/2017  . GERD (gastroesophageal reflux disease) 09/09/2017  . Testosterone deficiency 09/09/2017  . OSA (obstructive sleep apnea) 09/09/2017  . Chronic bilateral low back pain without sciatica 09/09/2017  . H/O knee surgery 04/27/2014  . 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 [place], [place],  [address] Phone: [phone]   Fax:  [phone]  Name: Jimmy Plessinger MRN: [id number] Date of Birth: 1951/08/22

## 2019-07-25 ENCOUNTER — Other Ambulatory Visit: Payer: Self-pay

## 2019-07-25 ENCOUNTER — Ambulatory Visit: Payer: Medicare Other

## 2019-07-25 DIAGNOSIS — M6281 Muscle weakness (generalized): Secondary | ICD-10-CM

## 2019-07-25 DIAGNOSIS — G8929 Other chronic pain: Secondary | ICD-10-CM | POA: Diagnosis not present

## 2019-07-25 DIAGNOSIS — M545 Low back pain, unspecified: Secondary | ICD-10-CM

## 2019-07-25 NOTE — Therapy (Signed)
[place] MAIN [facility] SERVICES [address] [place], [place], [address] Phone: [phone]   Fax:  [phone]  Physical Therapy Treatment  Patient Details  Name: Adam Wise MRN: [id number] Date of Birth: [birth year] No data recorded  Encounter Date: 07/25/2019  PT End of Session - 07/25/19 1228    Visit Number  2    Number of Visits  16    Date for PT Re-Evaluation  09/20/19    Authorization Type  Medicare    Authorization Time Period  07/21/19-09/20/19    Authorization - Visit Number  2    Authorization - Number of Visits  10    PT Start Time  1033    PT Stop Time  1115    PT Time Calculation (min)  42 min    Activity Tolerance  Patient tolerated treatment well;Patient limited by pain    Behavior During Therapy  [facility] for tasks assessed/performed       Past Medical History:  Diagnosis Date  . Chickenpox   . Chronic knee pain   . Chronic low back pain   . Essential hypertension   . GERD (gastroesophageal reflux disease)   . OSA (obstructive sleep apnea)   . Testosterone deficiency   . Type 2 diabetes mellitus ([place])     Past Surgical History:  Procedure Laterality Date  . COLONOSCOPY WITH PROPOFOL N/A 12/17/2018   Procedure: COLONOSCOPY WITH PROPOFOL;  Surgeon: Virgel Manifold, MD;  Location: [facility] ENDOSCOPY;  Service: Gastroenterology;  Laterality: N/A;  . JOINT REPLACEMENT Bilateral   . REPLACEMENT TOTAL KNEE BILATERAL  2015  . TONSILLECTOMY  1960    There were no vitals filed for this visit.  Subjective Assessment - 07/25/19 1224    Subjective  Patient reports pain limits all activities of daily life. Has been trying to perform the repeated flexion but is limited by pain.    Pertinent History  Pt suspects a 3-4 days after working out at [employer], pt started having severe "sharp" pain at the top of his gluteal cleft. He assumed it was his back, started going to chiro, initally better, but essentially unchanged, persisted for  2x/week for about 6 weeks. Pt eventually was referred to DC, pt given a variety of pain medications (tramadol, 'oxy', flexaril), none of which helped.  Eventually saw the physiatrist who was questioning injections and/or ablasion. Pt also saw neurology Dr. Lacinda Axon who says patient has no operable issues as his diagnostic injections down to L5/S1 were higher than symptomatic level.    Limitations  Lifting;Standing;House hold activities    Currently in Pain?  Yes    Pain Score  4     Pain Location  Back    Pain Orientation  Lower    Pain Descriptors / Indicators  Sharp    Pain Type  Chronic pain    Pain Onset  More than a month ago   Feb 2020   Pain Frequency  Constant    Aggravating Factors   standing    Pain Relieving Factors  rest    Effect of Pain on Daily Activities  limits         Treatment:   Manual Prone Mobilizations CPA, UPA L1-sacrum grade II-III, noted hypomobility 2x10 seconds each LE Sacral inferior mobilizations, central and lateral 3x 15 second pulses of grade II mobilizations STM to R piriformis/gluteal musculature x 3 minutes with noted muscle tissue limitations with tenderness and trigger points noted. Hip flexor/iliopsoas lengthening stretch  bent knee with PT overpressure 30 seconds each side x 2 trials  Supine single knee to chest stretch 30 seconds each side no pain, limited by body habitus rather than musculoskeletal  Gentle LE rocking laterally for low back reduction of muscle tissue length limitations  Ther Ex: Patient guided in maintaining alignment and muscle activation for pain reduction and proper muscle activation.   hooklying: posterior pelvic tilts with tactile cueing for muscle activation 10x 3 second holds (added to HEP) hooklying posterior pelvic tilts with adduction squeeze 10x3 second holds Knees over bolster TrA activation 10x 3 second holds. Initially required tactile cueing to avoid total abdomen activation and focus on central activation of  solely TrA activation TrA activation with swiss ball squeeze between knees and hands 10x, second set with posterior pelvic tilt added   Patient educated on need to perform log rolling in/out bed      Pt educated throughout session about proper posture and technique with exercises. Improved exercise technique, movement at target joints, use of target muscles after min to mod verbal, visual, tactile cues.                PT Education - 07/25/19 1226    Education Details  exercise technique, adding posterior pelvic tilts and TrA activation to HEP    Person(s) Educated  Patient    Methods  Explanation;Demonstration;Tactile cues;Verbal cues    Comprehension  Verbalized understanding;Returned demonstration;Verbal cues required;Tactile cues required;Need further instruction       PT Short Term Goals - 07/21/19 1500      PT SHORT TERM GOAL #1   Title  Patient will be independent in home exercise program to improve strength/mobility for better functional independence with ADLs.    Time  4    Period  Weeks    Status  New    Target Date  08/21/19      PT SHORT TERM GOAL #2   Title  After 4 weeks pt wil lreport consistet pain from sitting to standing up to 5 minutes without increaes.    Baseline  3-4 point increase from sitting to standing.    Time  4    Period  Weeks    Status  New    Target Date  08/21/19        PT Long Term Goals - 07/21/19 1501      PT LONG TERM GOAL #1   Title  Afte r8 weeks patient will improve walking tolerane to covering >1000 in 6MWT with LRAD.    Baseline  pain with limited community distance AMB    Time  8    Period  Weeks    Status  New    Target Date  09/20/19      PT LONG TERM GOAL #2   Title  After 8 weeks pt will report improved independence in self management of symptoms excluding use of medication.    Baseline  currently uses TENS, Gabapentin, or heat.    Time  8    Period  Weeks    Status  New    Target Date  09/20/19      PT  LONG TERM GOAL #3   Title  After 8 weeks pt to demonstrate improved sciatic neurodynamics AEB PSLR >60 degrees bilat without subjective sciatic tension.    Baseline  45 degrees Right, 60 degrees left    Time  8    Period  Weeks    Status  New  Target Date  09/20/19            Plan - 07/25/19 1231    Clinical Impression Statement  Patient presents with noted gait abnormalities and limited trunk mobility with ambulation due to low back pain. Patient reports slight reduction of pain s/p manual and therex and demonstrates understanding of interventions added to HEP. Continued focus on abdominal strengthening, muscle tissue lengthening/alignment, and pain reduction. Patient will benefit from skilled physical therapy to improve posture, functional capacity for mobility, and pain reduction for improved quality of life.    Personal Factors and Comorbidities  Behavior Pattern;Comorbidity 1;Past/Current Experience;Fitness;Time since onset of injury/illness/exacerbation    Comorbidities  Morbid Obesity    Examination-Activity Limitations  Bed Mobility;Squat;Stairs;Locomotion Level;Stand    Examination-Participation Restrictions  Shop;Community Activity;Yard Work;Meal Prep    Stability/Clinical Decision Making  Unstable/Unpredictable    Rehab Potential  Fair    Clinical Impairments Affecting Rehab Potential  (-) HTN, reflux, bilateral TKA, diabetes, history of chronic back pain,recent move (+) good family support and understanding of medical field    PT Frequency  2x / week    PT Duration  8 weeks    PT Treatment/Interventions  ADLs/Self Care Home Management;Aquatic Therapy;Cryotherapy;Electrical Stimulation;Iontophoresis 4mg /ml Dexamethasone;Moist Heat;Traction;Ultrasound;Balance training;Therapeutic exercise;Therapeutic activities;Functional mobility training;Stair training;Gait training;Neuromuscular re-education;Patient/family education;Manual techniques;Passive range of motion;Dry  needling;Energy conservation;Taping    PT Next Visit Plan  FU on response to flexion stretches; explore abdominal strength for HEP; knee jerk and ankle jerk reflexes.    PT Home Exercise Plan  seated floor touch streth 3x60sec 2x daily.    Consulted and Agree with Plan of Care  Patient       Patient will benefit from skilled therapeutic intervention in order to improve the following deficits and impairments:  Abnormal gait, Decreased activity tolerance, Decreased balance, Decreased endurance, Decreased mobility, Decreased range of motion, Difficulty walking, Decreased strength, Hypomobility, Impaired flexibility, Impaired perceived functional ability, Increased muscle spasms, Postural dysfunction, Pain, Increased fascial restricitons, Improper body mechanics, Obesity  Visit Diagnosis: 1. Chronic midline low back pain without sciatica   2. Muscle weakness (generalized)        Problem List Patient Active Problem List   Diagnosis Date Noted  . Joint swelling 11/16/2018  . 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 Essential hypertension 09/09/2017  . Type 2 diabetes mellitus ([place]) 09/09/2017  . GERD (gastroesophageal reflux disease) 09/09/2017  . Testosterone deficiency 09/09/2017  . OSA (obstructive sleep apnea) 09/09/2017  . Chronic bilateral low back pain without sciatica 09/09/2017  . H/O knee surgery 04/27/2014  . 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 [place], [place], [address] Phone: [phone]   Fax:  [phone]  Name: Cavin Longman MRN: [id number] Date of Birth: Aug 10, 1951

## 2019-07-27 ENCOUNTER — Ambulatory Visit: Payer: Medicare Other

## 2019-07-27 ENCOUNTER — Other Ambulatory Visit: Payer: Self-pay

## 2019-07-27 DIAGNOSIS — M6281 Muscle weakness (generalized): Secondary | ICD-10-CM

## 2019-07-27 DIAGNOSIS — M545 Low back pain, unspecified: Secondary | ICD-10-CM

## 2019-07-27 DIAGNOSIS — G8929 Other chronic pain: Secondary | ICD-10-CM

## 2019-07-27 NOTE — Therapy (Signed)
[place] MAIN [facility] SERVICES [address] [place], [place], [address] Phone: [phone]   Fax:  [phone]  Physical Therapy Treatment  Patient Details  Name: Adam Wise MRN: [id number] Date of Birth: 1951/12/08 No data recorded  Encounter Date: 07/27/2019  PT End of Session - 07/27/19 1610    Visit Number  3    Number of Visits  16    Date for PT Re-Evaluation  09/20/19    Authorization Type  Medicare    Authorization Time Period  07/21/19-09/20/19    Authorization - Visit Number  3    Authorization - Number of Visits  10    PT Start Time  1030    PT Stop Time  1114    PT Time Calculation (min)  44 min    Activity Tolerance  Patient tolerated treatment well;Patient limited by pain    Behavior During Therapy  [facility] for tasks assessed/performed       Past Medical History:  Diagnosis Date  . Chickenpox   . Chronic knee pain   . Chronic low back pain   . Essential hypertension   . GERD (gastroesophageal reflux disease)   . OSA (obstructive sleep apnea)   . Testosterone deficiency   . Type 2 diabetes mellitus ([place])     Past Surgical History:  Procedure Laterality Date  . COLONOSCOPY WITH PROPOFOL N/A 12/17/2018   Procedure: COLONOSCOPY WITH PROPOFOL;  Surgeon: Virgel Manifold, MD;  Location: [facility] ENDOSCOPY;  Service: Gastroenterology;  Laterality: N/A;  . JOINT REPLACEMENT Bilateral   . REPLACEMENT TOTAL KNEE BILATERAL  2015  . TONSILLECTOMY  1960    There were no vitals filed for this visit.  Subjective Assessment - 07/27/19 1602    Subjective  Patient reports 8/10 pain this morning prior to her shower, after shower and HEP was decreased to the 5/10 it is now. Has been compliant with HEP.    Pertinent History  Pt suspects a 3-4 days after working out at [employer], pt started having severe "sharp" pain at the top of his gluteal cleft. He assumed it was his back, started going to chiro, initally better, but essentially  unchanged, persisted for 2x/week for about 6 weeks. Pt eventually was referred to DC, pt given a variety of pain medications (tramadol, 'oxy', flexaril), none of which helped.  Eventually saw the physiatrist who was questioning injections and/or ablasion. Pt also saw neurology Dr. Lacinda Axon who says patient has no operable issues as his diagnostic injections down to L5/S1 were higher than symptomatic level.    Limitations  Lifting;Standing;House hold activities    Currently in Pain?  Yes    Pain Score  5     Pain Location  Back    Pain Orientation  Lower    Pain Descriptors / Indicators  Sharp    Pain Type  Chronic pain;Neuropathic pain    Pain Onset  More than a month ago   Feb 2020   Pain Frequency  Constant    Aggravating Factors   standing    Pain Relieving Factors  rest         Manual Prone Mobilizations CPA, UPA L1-sacrum grade II-III, noted hypomobility 2x10 seconds each LE; mid and upper thoracic J sweep pressure for postural alignment x 20 seconds x 2 trials Sacral inferior mobilizations, central and lateral 3x 15 second pulses of grade II mobilizations STM to R piriformis/gluteal musculature x 7 minutes with noted muscle tissue limitations with tenderness  and trigger points noted. Implemented effleurage and ptrissage techniques for reduction of muscle tissue tension and pain relief.  Supine:  SAD inferior with belt 3x 20 seconds each LE,     Ther Ex: Patient guided in maintaining alignment and muscle activation for pain reduction and proper muscle activation.   hooklying: posterior pelvic tilts with tactile cueing for muscle activation 10x 3 second holds hooklying posterior pelvic tilts with adduction squeeze 10x3 second holds TrA activation with swiss ball squeeze between knees and hands 10x, second set with posterior pelvic tilt added TrA activation in hooklying with single leg abduction; cueing for inhale during abduction and exhale during adduction of leg 12x each  LE  Ambulate in gym s/p therex and manual with improved heel strike, step length, and arm swing.      E stim: module 2 level 51 ; 100 Hz, 200 [place] worn during interventions    Pt educated throughout session about proper posture and technique with exercises. Improved exercise technique, movement at target joints, use of target muscles after min to mod verbal, visual, tactile cues.                         PT Education - 07/27/19 1609    Education Details  exercise technique, TrA activation, manual    Person(s) Educated  Patient    Methods  Explanation;Demonstration;Tactile cues;Verbal cues    Comprehension  Verbalized understanding;Returned demonstration;Verbal cues required;Tactile cues required       PT Short Term Goals - 07/21/19 1500      PT SHORT TERM GOAL #1   Title  Patient will be independent in home exercise program to improve strength/mobility for better functional independence with ADLs.    Time  4    Period  Weeks    Status  New    Target Date  08/21/19      PT SHORT TERM GOAL #2   Title  After 4 weeks pt wil lreport consistet pain from sitting to standing up to 5 minutes without increaes.    Baseline  3-4 point increase from sitting to standing.    Time  4    Period  Weeks    Status  New    Target Date  08/21/19        PT Long Term Goals - 07/21/19 1501      PT LONG TERM GOAL #1   Title  Afte r8 weeks patient will improve walking tolerane to covering >1000 in 6MWT with LRAD.    Baseline  pain with limited community distance AMB    Time  8    Period  Weeks    Status  New    Target Date  09/20/19      PT LONG TERM GOAL #2   Title  After 8 weeks pt will report improved independence in self management of symptoms excluding use of medication.    Baseline  currently uses TENS, Gabapentin, or heat.    Time  8    Period  Weeks    Status  New    Target Date  09/20/19      PT LONG TERM GOAL #3   Title  After 8 weeks pt to demonstrate  improved sciatic neurodynamics AEB PSLR >60 degrees bilat without subjective sciatic tension.    Baseline  45 degrees Right, 60 degrees left    Time  8    Period  Weeks    Status  New  Target Date  09/20/19            Plan - 07/27/19 1611    Clinical Impression Statement  Patient presents with increased pain to today's session. By end of session patient reports a pain reduction of 3 points and has improved gait mechanics. Patient required intensive soft tissue work to lumbar paraspinals prior to all other interventions due to high guarding and limited tissue length. Patient will benefit from skilled physical therapy to improve posture, functional capacity for mobility, and pain reduction for improved quality of life.    Personal Factors and Comorbidities  Behavior Pattern;Comorbidity 1;Past/Current Experience;Fitness;Time since onset of injury/illness/exacerbation    Comorbidities  Morbid Obesity    Examination-Activity Limitations  Bed Mobility;Squat;Stairs;Locomotion Level;Stand    Examination-Participation Restrictions  Shop;Community Activity;Yard Work;Meal Prep    Stability/Clinical Decision Making  Unstable/Unpredictable    Rehab Potential  Fair    Clinical Impairments Affecting Rehab Potential  (-) HTN, reflux, bilateral TKA, diabetes, history of chronic back pain,recent move (+) good family support and understanding of medical field    PT Frequency  2x / week    PT Duration  8 weeks    PT Treatment/Interventions  ADLs/Self Care Home Management;Aquatic Therapy;Cryotherapy;Electrical Stimulation;Iontophoresis 4mg /ml Dexamethasone;Moist Heat;Traction;Ultrasound;Balance training;Therapeutic exercise;Therapeutic activities;Functional mobility training;Stair training;Gait training;Neuromuscular re-education;Patient/family education;Manual techniques;Passive range of motion;Dry needling;Energy conservation;Taping    PT Next Visit Plan  FU on response to flexion stretches; explore  abdominal strength for HEP; knee jerk and ankle jerk reflexes.    PT Home Exercise Plan  seated floor touch streth 3x60sec 2x daily.    Consulted and Agree with Plan of Care  Patient       Patient will benefit from skilled therapeutic intervention in order to improve the following deficits and impairments:  Abnormal gait, Decreased activity tolerance, Decreased balance, Decreased endurance, Decreased mobility, Decreased range of motion, Difficulty walking, Decreased strength, Hypomobility, Impaired flexibility, Impaired perceived functional ability, Increased muscle spasms, Postural dysfunction, Pain, Increased fascial restricitons, Improper body mechanics, Obesity  Visit Diagnosis: 1. Chronic midline low back pain without sciatica   2. Muscle weakness (generalized)        Problem List Patient Active Problem List   Diagnosis Date Noted  . Joint swelling 11/16/2018  . 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 Essential hypertension 09/09/2017  . Type 2 diabetes mellitus ([place]) 09/09/2017  . GERD (gastroesophageal reflux disease) 09/09/2017  . Testosterone deficiency 09/09/2017  . OSA (obstructive sleep apnea) 09/09/2017  . Chronic bilateral low back pain without sciatica 09/09/2017  . H/O knee surgery 04/27/2014  . 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

## 2019-08-01 ENCOUNTER — Other Ambulatory Visit: Payer: Self-pay

## 2019-08-01 ENCOUNTER — Ambulatory Visit: Payer: Medicare Other

## 2019-08-01 DIAGNOSIS — M6281 Muscle weakness (generalized): Secondary | ICD-10-CM

## 2019-08-01 DIAGNOSIS — M545 Low back pain, unspecified: Secondary | ICD-10-CM

## 2019-08-01 DIAGNOSIS — G8929 Other chronic pain: Secondary | ICD-10-CM | POA: Diagnosis not present

## 2019-08-01 NOTE — Therapy (Signed)
[place] MAIN [facility] SERVICES [address] [place], [place], [address] Phone: [phone]   Fax:  [phone]  Physical Therapy Treatment  Patient Details  Name: Adam Wise MRN: [id number] Date of Birth: Apr 04, 1951 No data recorded  Encounter Date: 08/01/2019  PT End of Session - 08/01/19 1227    Visit Number  4    Number of Visits  16    Date for PT Re-Evaluation  09/20/19    Authorization Type  Medicare    Authorization Time Period  07/21/19-09/20/19    Authorization - Visit Number  4    Authorization - Number of Visits  10    PT Start Time  1036    PT Stop Time  1115    PT Time Calculation (min)  39 min    Activity Tolerance  Patient tolerated treatment well;Patient limited by pain    Behavior During Therapy  [facility] for tasks assessed/performed       Past Medical History:  Diagnosis Date  . Chickenpox   . Chronic knee pain   . Chronic low back pain   . Essential hypertension   . GERD (gastroesophageal reflux disease)   . OSA (obstructive sleep apnea)   . Testosterone deficiency   . Type 2 diabetes mellitus ([place])     Past Surgical History:  Procedure Laterality Date  . COLONOSCOPY WITH PROPOFOL N/A 12/17/2018   Procedure: COLONOSCOPY WITH PROPOFOL;  Surgeon: Virgel Manifold, MD;  Location: [facility] ENDOSCOPY;  Service: Gastroenterology;  Laterality: N/A;  . JOINT REPLACEMENT Bilateral   . REPLACEMENT TOTAL KNEE BILATERAL  2015  . TONSILLECTOMY  1960    There were no vitals filed for this visit.  Subjective Assessment - 08/01/19 1106    Subjective  Patient reports he has had the best weekend since his back pain began. Reports he overdid it due to feeling better and is paying more this morning. Has been compliant with HEP.    Pertinent History  Pt suspects a 3-4 days after working out at [employer], pt started having severe "sharp" pain at the top of his gluteal cleft. He assumed it was his back, started going to chiro, initally  better, but essentially unchanged, persisted for 2x/week for about 6 weeks. Pt eventually was referred to DC, pt given a variety of pain medications (tramadol, 'oxy', flexaril), none of which helped.  Eventually saw the physiatrist who was questioning injections and/or ablasion. Pt also saw neurology Dr. Lacinda Axon who says patient has no operable issues as his diagnostic injections down to L5/S1 were higher than symptomatic level.    Limitations  Lifting;Standing;House hold activities    Currently in Pain?  Yes    Pain Score  5     Pain Location  Back    Pain Orientation  Lower    Pain Descriptors / Indicators  Aching;Throbbing    Pain Type  Chronic pain;Neuropathic pain    Pain Onset  More than a month ago   Feb 2020   Pain Frequency  Constant         4-5/10 pain  Reports pain is improving       Manual Prone Mobilizations CPA, UPA L1-sacrum grade II-III, noted hypomobility 2x10 seconds each LE; mid and upper thoracic J sweep pressure for postural alignment x 20 seconds x 2 trials Sacral inferior mobilizations, central and lateral 3x 15 second pulses of grade II mobilizations STM to R piriformis/gluteal musculature x 7 minutes with noted muscle tissue limitations  with tenderness and trigger points noted. Implemented effleurage and petrissage techniques for reduction of muscle tissue tension and pain relief.  Supine:  SAD inferior with belt 4x 20 seconds each LE,      Ther Ex: Patient guided in maintaining alignment and muscle activation for pain reduction and proper muscle activation.    hooklying: posterior pelvic tilts with tactile cueing for muscle activation 10x 3 second holds hooklying posterior pelvic tilts with adduction squeeze 10x3 second holds hooklying posterior pelvic tilt with RTB abduction 10x; cueing for sequencing.  TrA activation in hooklying with single leg abduction; cueing for inhale during abduction and exhale during adduction of leg 12x each LE   Ambulate in gym  s/p therex and manual with improved heel strike, step length, and arm swing.       E stim: module 2 level 51 ; 100 Hz, 200 [place] worn during interventions:. Patient educated on proper placement of electrodes for home usage. Given handout/picture of proper placement with cross pattern.     Pt educated throughout session about proper posture and technique with exercises. Improved exercise technique, movement at target joints, use of target muscles after min to mod verbal, visual, tactile cues.                     PT Education - 08/01/19 1226    Education Details  exercise technique, abdominal activation, manual    Person(s) Educated  Patient    Methods  Explanation;Demonstration;Tactile cues;Verbal cues;Handout    Comprehension  Verbalized understanding;Returned demonstration;Verbal cues required;Tactile cues required;Need further instruction       PT Short Term Goals - 07/21/19 1500      PT SHORT TERM GOAL #1   Title  Patient will be independent in home exercise program to improve strength/mobility for better functional independence with ADLs.    Time  4    Period  Weeks    Status  New    Target Date  08/21/19      PT SHORT TERM GOAL #2   Title  After 4 weeks pt wil lreport consistet pain from sitting to standing up to 5 minutes without increaes.    Baseline  3-4 point increase from sitting to standing.    Time  4    Period  Weeks    Status  New    Target Date  08/21/19        PT Long Term Goals - 07/21/19 1501      PT LONG TERM GOAL #1   Title  Afte r8 weeks patient will improve walking tolerane to covering >1000 in 6MWT with LRAD.    Baseline  pain with limited community distance AMB    Time  8    Period  Weeks    Status  New    Target Date  09/20/19      PT LONG TERM GOAL #2   Title  After 8 weeks pt will report improved independence in self management of symptoms excluding use of medication.    Baseline  currently uses TENS, Gabapentin, or heat.     Time  8    Period  Weeks    Status  New    Target Date  09/20/19      PT LONG TERM GOAL #3   Title  After 8 weeks pt to demonstrate improved sciatic neurodynamics AEB PSLR >60 degrees bilat without subjective sciatic tension.    Baseline  45 degrees Right, 60 degrees left  Time  8    Period  Weeks    Status  New    Target Date  09/20/19            Plan - 08/01/19 1229    Clinical Impression Statement  Patient arrived slightly late to session limiting session duration. Patient is reporting progress with decreasing pain and improved functional mobility. Progression of posterior pelvic tilt/ postural musculature strengthening performed with patient tolerating it well. Patient given handout for proper placement of pads for TENS unit during home usage. Patient will benefit from skilled physical therapy to improve posture, functional capacity for mobility, and pain reduction for improved quality of life.    Personal Factors and Comorbidities  Behavior Pattern;Comorbidity 1;Past/Current Experience;Fitness;Time since onset of injury/illness/exacerbation    Comorbidities  Morbid Obesity    Examination-Activity Limitations  Bed Mobility;Squat;Stairs;Locomotion Level;Stand    Examination-Participation Restrictions  Shop;Community Activity;Yard Work;Meal Prep    Stability/Clinical Decision Making  Unstable/Unpredictable    Rehab Potential  Fair    Clinical Impairments Affecting Rehab Potential  (-) HTN, reflux, bilateral TKA, diabetes, history of chronic back pain,recent move (+) good family support and understanding of medical field    PT Frequency  2x / week    PT Duration  8 weeks    PT Treatment/Interventions  ADLs/Self Care Home Management;Aquatic Therapy;Cryotherapy;Electrical Stimulation;Iontophoresis 4mg /ml Dexamethasone;Moist Heat;Traction;Ultrasound;Balance training;Therapeutic exercise;Therapeutic activities;Functional mobility training;Stair training;Gait training;Neuromuscular  re-education;Patient/family education;Manual techniques;Passive range of motion;Dry needling;Energy conservation;Taping    PT Next Visit Plan  FU on response to flexion stretches; explore abdominal strength for HEP; knee jerk and ankle jerk reflexes.    PT Home Exercise Plan  seated floor touch streth 3x60sec 2x daily.    Consulted and Agree with Plan of Care  Patient       Patient will benefit from skilled therapeutic intervention in order to improve the following deficits and impairments:  Abnormal gait, Decreased activity tolerance, Decreased balance, Decreased endurance, Decreased mobility, Decreased range of motion, Difficulty walking, Decreased strength, Hypomobility, Impaired flexibility, Impaired perceived functional ability, Increased muscle spasms, Postural dysfunction, Pain, Increased fascial restricitons, Improper body mechanics, Obesity  Visit Diagnosis: 1. Chronic midline low back pain without sciatica   2. Muscle weakness (generalized)        Problem List Patient Active Problem List   Diagnosis Date Noted  . Joint swelling 11/16/2018  . 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 Essential hypertension 09/09/2017  . Type 2 diabetes mellitus ([place]) 09/09/2017  . GERD (gastroesophageal reflux disease) 09/09/2017  . Testosterone deficiency 09/09/2017  . OSA (obstructive sleep apnea) 09/09/2017  . Chronic bilateral low back pain without sciatica 09/09/2017  . H/O knee surgery 04/27/2014  . 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

## 2019-08-04 ENCOUNTER — Other Ambulatory Visit: Payer: Self-pay

## 2019-08-04 ENCOUNTER — Ambulatory Visit: Payer: Medicare Other

## 2019-08-04 DIAGNOSIS — G8929 Other chronic pain: Secondary | ICD-10-CM | POA: Diagnosis not present

## 2019-08-04 DIAGNOSIS — M545 Low back pain, unspecified: Secondary | ICD-10-CM

## 2019-08-04 DIAGNOSIS — M6281 Muscle weakness (generalized): Secondary | ICD-10-CM | POA: Diagnosis not present

## 2019-08-04 NOTE — Therapy (Signed)
[place] MAIN [facility] SERVICES [address] [place], [place], [address] Phone: [phone]   Fax:  [phone]  Physical Therapy Treatment  Patient Details  Name: Adam Wise MRN: [id number] Date of Birth: 18-Aug-1951 No data recorded  Encounter Date: 08/04/2019  PT End of Session - 08/04/19 0921    Visit Number  5    Number of Visits  16    Date for PT Re-Evaluation  09/20/19    Authorization Type  Medicare    Authorization Time Period  07/21/19-09/20/19    Authorization - Visit Number  5    Authorization - Number of Visits  10    PT Start Time  0844    PT Stop Time  0928    PT Time Calculation (min)  44 min    Activity Tolerance  Patient tolerated treatment well;Patient limited by pain    Behavior During Therapy  [facility] for tasks assessed/performed       Past Medical History:  Diagnosis Date  . Chickenpox   . Chronic knee pain   . Chronic low back pain   . Essential hypertension   . GERD (gastroesophageal reflux disease)   . OSA (obstructive sleep apnea)   . Testosterone deficiency   . Type 2 diabetes mellitus ([place])     Past Surgical History:  Procedure Laterality Date  . COLONOSCOPY WITH PROPOFOL N/A 12/17/2018   Procedure: COLONOSCOPY WITH PROPOFOL;  Surgeon: Virgel Manifold, MD;  Location: [facility] ENDOSCOPY;  Service: Gastroenterology;  Laterality: N/A;  . JOINT REPLACEMENT Bilateral   . REPLACEMENT TOTAL KNEE BILATERAL  2015  . TONSILLECTOMY  1960    There were no vitals filed for this visit.  Subjective Assessment - 08/04/19 0919    Subjective  Patient reports his back pain has increased since last session. Feeling more stiff.    Pertinent History  Pt suspects a 3-4 days after working out at [employer], pt started having severe "sharp" pain at the top of his gluteal cleft. He assumed it was his back, started going to chiro, initally better, but essentially unchanged, persisted for 2x/week for about 6 weeks. Pt eventually was  referred to DC, pt given a variety of pain medications (tramadol, 'oxy', flexaril), none of which helped.  Eventually saw the physiatrist who was questioning injections and/or ablasion. Pt also saw neurology Dr. Lacinda Axon who says patient has no operable issues as his diagnostic injections down to L5/S1 were higher than symptomatic level.    Limitations  Lifting;Standing;House hold activities    Currently in Pain?  Yes    Pain Score  5     Pain Location  Back    Pain Orientation  Lower    Pain Descriptors / Indicators  Aching;Throbbing    Pain Type  Chronic pain;Neuropathic pain    Pain Onset  More than a month ago   Feb 2020   Pain Frequency  Constant           Manual Prone Mobilizations CPA, UPA L1-sacrum grade II-III, noted hypomobility 2x10 seconds each LE; mid and upper thoracic J sweep pressure for postural alignment x 20 seconds x5 trials Sacral inferior mobilizations, central and lateral 5x 15 second pulses of grade II mobilizations STM to R piriformis/gluteal musculature x 8 minutes with noted muscle tissue limitations with tenderness and trigger points noted. Implemented effleurage and ptrissage techniques for reduction of muscle tissue tension and pain relief.  Supine:  SAD inferior with belt 4x 20 seconds each  LE,      Ther Ex: Patient guided in maintaining alignment and muscle activation for pain reduction and proper muscle activation.    hooklying: posterior pelvic tilts with tactile cueing for muscle activation 10x 3 second holds hooklying posterior pelvic tilts with adduction squeeze 10x3 second holds TrA activation with swiss ball squeeze between knees and hands 10x, second set with posterior pelvic tilt added TrA activation in hooklying with single leg abduction; cueing for inhale during abduction and exhale during adduction of leg 12x each LE   Ambulate in gym s/p therex and manual with improved heel strike, step length, and arm swing.       E stim: module 2 level  51 ; 100 Hz, 200 [place] worn during interventions    Pt educated throughout session about proper posture and technique with exercises. Improved exercise technique, movement at target joints, use of target muscles after min to mod verbal, visual, tactile cues.        .                PT Education - 08/04/19 0920    Education Details  exercise technique, abdominal activation, manual    Person(s) Educated  Patient    Methods  Explanation;Demonstration;Tactile cues;Verbal cues    Comprehension  Verbalized understanding;Returned demonstration;Verbal cues required;Tactile cues required       PT Short Term Goals - 07/21/19 1500      PT SHORT TERM GOAL #1   Title  Patient will be independent in home exercise program to improve strength/mobility for better functional independence with ADLs.    Time  4    Period  Weeks    Status  New    Target Date  08/21/19      PT SHORT TERM GOAL #2   Title  After 4 weeks pt wil lreport consistet pain from sitting to standing up to 5 minutes without increaes.    Baseline  3-4 point increase from sitting to standing.    Time  4    Period  Weeks    Status  New    Target Date  08/21/19        PT Long Term Goals - 07/21/19 1501      PT LONG TERM GOAL #1   Title  Afte r8 weeks patient will improve walking tolerane to covering >1000 in 6MWT with LRAD.    Baseline  pain with limited community distance AMB    Time  8    Period  Weeks    Status  New    Target Date  09/20/19      PT LONG TERM GOAL #2   Title  After 8 weeks pt will report improved independence in self management of symptoms excluding use of medication.    Baseline  currently uses TENS, Gabapentin, or heat.    Time  8    Period  Weeks    Status  New    Target Date  09/20/19      PT LONG TERM GOAL #3   Title  After 8 weeks pt to demonstrate improved sciatic neurodynamics AEB PSLR >60 degrees bilat without subjective sciatic tension.    Baseline  45 degrees Right, 60  degrees left    Time  8    Period  Weeks    Status  New    Target Date  09/20/19            Plan - 08/04/19 1235    Clinical  Impression Statement  Patient presents to physical therapy with increased stiffness requiring prolonged manual intervention prior to being able to perform therex. Patient reports pain reduction to 2/10 by end of session. Progression of posterior pelvic tilt/ postural musculature strengthening performed with patient tolerating it well. Patient will benefit from skilled physical therapy to improve posture, functional capacity for mobility, and pain reduction for improved quality of life    Personal Factors and Comorbidities  Behavior Pattern;Comorbidity 1;Past/Current Experience;Fitness;Time since onset of injury/illness/exacerbation    Comorbidities  Morbid Obesity    Examination-Activity Limitations  Bed Mobility;Squat;Stairs;Locomotion Level;Stand    Examination-Participation Restrictions  Shop;Community Activity;Yard Work;Meal Prep    Stability/Clinical Decision Making  Unstable/Unpredictable    Rehab Potential  Fair    Clinical Impairments Affecting Rehab Potential  (-) HTN, reflux, bilateral TKA, diabetes, history of chronic back pain,recent move (+) good family support and understanding of medical field    PT Frequency  2x / week    PT Duration  8 weeks    PT Treatment/Interventions  ADLs/Self Care Home Management;Aquatic Therapy;Cryotherapy;Electrical Stimulation;Iontophoresis 4mg /ml Dexamethasone;Moist Heat;Traction;Ultrasound;Balance training;Therapeutic exercise;Therapeutic activities;Functional mobility training;Stair training;Gait training;Neuromuscular re-education;Patient/family education;Manual techniques;Passive range of motion;Dry needling;Energy conservation;Taping    PT Next Visit Plan  FU on response to flexion stretches; explore abdominal strength for HEP; knee jerk and ankle jerk reflexes.    PT Home Exercise Plan  seated floor touch streth  3x60sec 2x daily.    Consulted and Agree with Plan of Care  Patient       Patient will benefit from skilled therapeutic intervention in order to improve the following deficits and impairments:  Abnormal gait, Decreased activity tolerance, Decreased balance, Decreased endurance, Decreased mobility, Decreased range of motion, Difficulty walking, Decreased strength, Hypomobility, Impaired flexibility, Impaired perceived functional ability, Increased muscle spasms, Postural dysfunction, Pain, Increased fascial restricitons, Improper body mechanics, Obesity  Visit Diagnosis: 1. Chronic midline low back pain without sciatica   2. Muscle weakness (generalized)        Problem List Patient Active Problem List   Diagnosis Date Noted  . Joint swelling 11/16/2018  . 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 Essential hypertension 09/09/2017  . Type 2 diabetes mellitus ([place]) 09/09/2017  . GERD (gastroesophageal reflux disease) 09/09/2017  . Testosterone deficiency 09/09/2017  . OSA (obstructive sleep apnea) 09/09/2017  . Chronic bilateral low back pain without sciatica 09/09/2017  . H/O knee surgery 04/27/2014  . 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

## 2019-08-09 ENCOUNTER — Ambulatory Visit: Payer: Medicare Other

## 2019-08-09 ENCOUNTER — Other Ambulatory Visit: Payer: Self-pay

## 2019-08-09 DIAGNOSIS — M6281 Muscle weakness (generalized): Secondary | ICD-10-CM

## 2019-08-09 DIAGNOSIS — M545 Low back pain, unspecified: Secondary | ICD-10-CM

## 2019-08-09 DIAGNOSIS — G8929 Other chronic pain: Secondary | ICD-10-CM | POA: Diagnosis not present

## 2019-08-09 NOTE — Therapy (Signed)
[place] MAIN [facility] SERVICES [address] [place], [place], [address] Phone: [phone]   Fax:  [phone]  Physical Therapy Treatment  Patient Details  Name: Adam Wise MRN: [id number] Date of Birth: 1951/05/30 No data recorded  Encounter Date: 08/09/2019  PT End of Session - 08/09/19 0909    Visit Number  6    Number of Visits  16    Date for PT Re-Evaluation  09/20/19    Authorization Type  Medicare    Authorization Time Period  07/21/19-09/20/19    Authorization - Visit Number  6    Authorization - Number of Visits  10    PT Start Time  0844    PT Stop Time  0929    PT Time Calculation (min)  45 min    Activity Tolerance  Patient tolerated treatment well;Patient limited by pain    Behavior During Therapy  [facility] for tasks assessed/performed       Past Medical History:  Diagnosis Date  . Chickenpox   . Chronic knee pain   . Chronic low back pain   . Essential hypertension   . GERD (gastroesophageal reflux disease)   . OSA (obstructive sleep apnea)   . Testosterone deficiency   . Type 2 diabetes mellitus ([place])     Past Surgical History:  Procedure Laterality Date  . COLONOSCOPY WITH PROPOFOL N/A 12/17/2018   Procedure: COLONOSCOPY WITH PROPOFOL;  Surgeon: Virgel Manifold, MD;  Location: [facility] ENDOSCOPY;  Service: Gastroenterology;  Laterality: N/A;  . JOINT REPLACEMENT Bilateral   . REPLACEMENT TOTAL KNEE BILATERAL  2015  . TONSILLECTOMY  1960    There were no vitals filed for this visit.  Subjective Assessment - 08/09/19 0845    Subjective  Patient reports he had a good weekend, has been compliant with HEP. Mowed his lawn friday. Became stiff this morning.    Pertinent History  Pt suspects a 3-4 days after working out at [employer], pt started having severe "sharp" pain at the top of his gluteal cleft. He assumed it was his back, started going to chiro, initally better, but essentially unchanged, persisted for 2x/week for  about 6 weeks. Pt eventually was referred to DC, pt given a variety of pain medications (tramadol, 'oxy', flexaril), none of which helped.  Eventually saw the physiatrist who was questioning injections and/or ablasion. Pt also saw neurology Dr. Lacinda Axon who says patient has no operable issues as his diagnostic injections down to L5/S1 were higher than symptomatic level.    Limitations  Lifting;Standing;House hold activities    Currently in Pain?  Yes    Pain Score  4     Pain Location  Back    Pain Orientation  Lower    Pain Descriptors / Indicators  Aching    Pain Type  Chronic pain    Pain Onset  More than a month ago   Feb 2020   Pain Frequency  Constant          Manual Prone Mobilizations CPA, UPA L1-sacrum grade II-III, noted hypomobility 2x10 seconds each LE; mid and upper thoracic J sweep pressure for postural alignment x 20 seconds x5 trials Sacral inferior mobilizations, central and lateral 5x 15 second pulses of grade II mobilizations STM to R piriformis/gluteal musculature x 6 minutes with noted muscle tissue limitations with tenderness and trigger points noted. Implemented effleurage and ptrissage techniques for reduction of muscle tissue tension and pain relief.   Supine:  SAD inferior  with belt 4x 20 seconds each LE,   Hamstring stretch with nerve glide df/pf 15x each LE Piriformis stretch hold 20 seconds.    Ther Ex: Patient guided in maintaining alignment and muscle activation for pain reduction and proper muscle activation.    hooklying: posterior pelvic tilts with tactile cueing for muscle activation 10x 3 second holds hooklying posterior pelvic tilts with adduction squeeze 10x3 second holds TrA activation in hooklying with single leg abduction; cueing for inhale during abduction and exhale during adduction of leg 12x each LE seated posterior pelvic tilt 15x ; cueing for foot placement for optimal muscle recruitment/body mechanics Seated TrA activation with upright  posture, arms crossed marching 10x each LE ; very challenging to patient.  Seated scapular retractions 10x,  Ambulate in gym s/p therex and manual with improved heel strike, step length, and arm swing.       E stim: module 2 level 51 ; 100 Hz, 200 [place] worn during interventions    Pt educated throughout session about proper posture and technique with exercises. Improved exercise technique, movement at target joints, use of target muscles after min to mod verbal, visual, tactile cues.                      PT Education - 08/09/19 0908    Education Details  exercise technique, abdominal activation, manual    Person(s) Educated  Patient    Methods  Explanation;Demonstration;Tactile cues;Verbal cues    Comprehension  Verbalized understanding;Returned demonstration;Verbal cues required;Tactile cues required       PT Short Term Goals - 07/21/19 1500      PT SHORT TERM GOAL #1   Title  Patient will be independent in home exercise program to improve strength/mobility for better functional independence with ADLs.    Time  4    Period  Weeks    Status  New    Target Date  08/21/19      PT SHORT TERM GOAL #2   Title  After 4 weeks pt wil lreport consistet pain from sitting to standing up to 5 minutes without increaes.    Baseline  3-4 point increase from sitting to standing.    Time  4    Period  Weeks    Status  New    Target Date  08/21/19        PT Long Term Goals - 07/21/19 1501      PT LONG TERM GOAL #1   Title  Afte r8 weeks patient will improve walking tolerane to covering >1000 in 6MWT with LRAD.    Baseline  pain with limited community distance AMB    Time  8    Period  Weeks    Status  New    Target Date  09/20/19      PT LONG TERM GOAL #2   Title  After 8 weeks pt will report improved independence in self management of symptoms excluding use of medication.    Baseline  currently uses TENS, Gabapentin, or heat.    Time  8    Period  Weeks    Status   New    Target Date  09/20/19      PT LONG TERM GOAL #3   Title  After 8 weeks pt to demonstrate improved sciatic neurodynamics AEB PSLR >60 degrees bilat without subjective sciatic tension.    Baseline  45 degrees Right, 60 degrees left    Time  8  Period  Weeks    Status  New    Target Date  09/20/19            Plan - 08/09/19 1112    Clinical Impression Statement  Patient progressing with decreased pain and increased duration of pain relief at this time. Starting to progress postural interventions for increased abdominal activation techniques as well as neutral alignment for reduction of symptoms. Patient is challenged with maintaining neutral position of spine in seated and standing for prolonged periods of time, fatiguing and relapsing to compensatory mechanics. Patient will benefit from skilled physical therapy to improve posture, functional capacity for mobility, and pain reduction for improved quality of life    Personal Factors and Comorbidities  Behavior Pattern;Comorbidity 1;Past/Current Experience;Fitness;Time since onset of injury/illness/exacerbation    Comorbidities  Morbid Obesity    Examination-Activity Limitations  Bed Mobility;Squat;Stairs;Locomotion Level;Stand    Examination-Participation Restrictions  Shop;Community Activity;Yard Work;Meal Prep    Stability/Clinical Decision Making  Unstable/Unpredictable    Rehab Potential  Fair    Clinical Impairments Affecting Rehab Potential  (-) HTN, reflux, bilateral TKA, diabetes, history of chronic back pain,recent move (+) good family support and understanding of medical field    PT Frequency  2x / week    PT Duration  8 weeks    PT Treatment/Interventions  ADLs/Self Care Home Management;Aquatic Therapy;Cryotherapy;Electrical Stimulation;Iontophoresis 4mg /ml Dexamethasone;Moist Heat;Traction;Ultrasound;Balance training;Therapeutic exercise;Therapeutic activities;Functional mobility training;Stair training;Gait  training;Neuromuscular re-education;Patient/family education;Manual techniques;Passive range of motion;Dry needling;Energy conservation;Taping    PT Next Visit Plan  FU on response to flexion stretches; explore abdominal strength for HEP; knee jerk and ankle jerk reflexes.    PT Home Exercise Plan  seated floor touch streth 3x60sec 2x daily.    Consulted and Agree with Plan of Care  Patient       Patient will benefit from skilled therapeutic intervention in order to improve the following deficits and impairments:  Abnormal gait, Decreased activity tolerance, Decreased balance, Decreased endurance, Decreased mobility, Decreased range of motion, Difficulty walking, Decreased strength, Hypomobility, Impaired flexibility, Impaired perceived functional ability, Increased muscle spasms, Postural dysfunction, Pain, Increased fascial restricitons, Improper body mechanics, Obesity  Visit Diagnosis: Chronic midline low back pain without sciatica  Muscle weakness (generalized)     Problem List Patient Active Problem List   Diagnosis Date Noted  . Joint swelling 11/16/2018  . 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 Essential hypertension 09/09/2017  . Type 2 diabetes mellitus ([place]) 09/09/2017  . GERD (gastroesophageal reflux disease) 09/09/2017  . Testosterone deficiency 09/09/2017  . OSA (obstructive sleep apnea) 09/09/2017  . Chronic bilateral low back pain without sciatica 09/09/2017  . H/O knee surgery 04/27/2014  . 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 [place], [place], [address] Phone: [phone]   Fax:  [phone]  Name: Rad Gramling MRN: [id number] Date of Birth: [birth year]

## 2019-08-11 ENCOUNTER — Ambulatory Visit: Payer: Medicare Other

## 2019-08-11 ENCOUNTER — Other Ambulatory Visit: Payer: Self-pay

## 2019-08-11 DIAGNOSIS — M545 Low back pain, unspecified: Secondary | ICD-10-CM

## 2019-08-11 DIAGNOSIS — M6281 Muscle weakness (generalized): Secondary | ICD-10-CM | POA: Diagnosis not present

## 2019-08-11 DIAGNOSIS — G8929 Other chronic pain: Secondary | ICD-10-CM

## 2019-08-11 NOTE — Therapy (Signed)
[place] MAIN [facility] SERVICES [address] [place], [place], [address] Phone: [phone]   Fax:  [phone]  Physical Therapy Treatment  Patient Details  Name: Adam Wise MRN: [id number] Date of Birth: 02-09-1951 No data recorded  Encounter Date: 08/11/2019  PT End of Session - 08/11/19 0926    Visit Number  7    Number of Visits  16    Date for PT Re-Evaluation  09/20/19    Authorization Type  Medicare    Authorization Time Period  07/21/19-09/20/19    Authorization - Visit Number  7    Authorization - Number of Visits  10    PT Start Time  0838    PT Stop Time  0924    PT Time Calculation (min)  46 min    Activity Tolerance  Patient tolerated treatment well;Patient limited by pain    Behavior During Therapy  [facility] for tasks assessed/performed       Past Medical History:  Diagnosis Date  . Chickenpox   . Chronic knee pain   . Chronic low back pain   . Essential hypertension   . GERD (gastroesophageal reflux disease)   . OSA (obstructive sleep apnea)   . Testosterone deficiency   . Type 2 diabetes mellitus ([place])     Past Surgical History:  Procedure Laterality Date  . COLONOSCOPY WITH PROPOFOL N/A 12/17/2018   Procedure: COLONOSCOPY WITH PROPOFOL;  Surgeon: Virgel Manifold, MD;  Location: [facility] ENDOSCOPY;  Service: Gastroenterology;  Laterality: N/A;  . JOINT REPLACEMENT Bilateral   . REPLACEMENT TOTAL KNEE BILATERAL  2015  . TONSILLECTOMY  1960    There were no vitals filed for this visit.  Subjective Assessment - 08/11/19 0841    Subjective  Patient reports he was able to clean his garage out yesterday, moving boxes, etc. Felt it yesterday but feeling a little bit better today. Much better than it would have been a few weeks ago per patient report. Compliant with HEp 2x/day.    Pertinent History  Pt suspects a 3-4 days after working out at [employer], pt started having severe "sharp" pain at the top of his gluteal cleft. He  assumed it was his back, started going to chiro, initally better, but essentially unchanged, persisted for 2x/week for about 6 weeks. Pt eventually was referred to DC, pt given a variety of pain medications (tramadol, 'oxy', flexaril), none of which helped.  Eventually saw the physiatrist who was questioning injections and/or ablasion. Pt also saw neurology Dr. Lacinda Axon who says patient has no operable issues as his diagnostic injections down to L5/S1 were higher than symptomatic level.    Limitations  Lifting;Standing;House hold activities    Currently in Pain?  Yes    Pain Score  3     Pain Location  Back    Pain Orientation  Lower    Pain Descriptors / Indicators  Aching    Pain Onset  More than a month ago   Feb 2020   Pain Frequency  Constant         Manual Prone Mobilizations CPA, UPA L1-sacrum grade II-III, noted hypomobility 2x10 seconds each LE; mid and upper thoracic J sweep pressure for postural alignment x 20 seconds x5 trials Sacral inferior mobilizations, central and lateral 5x 15 second pulses of grade II mobilizations STM to R piriformis/gluteal musculature, lumbar paraspinals x 7 minutes with noted muscle tissue limitations with tenderness and trigger points noted. Implemented effleurage and ptrissage techniques  along with use of metal tool for reduction of muscle tissue tension and pain relief.    Supine:   Hamstring stretch with nerve glide df/pf 15x each LE Nerve glides with SLR/ df/pf 20x each LE.  Piriformis stretch hold 20 seconds.    Ther Ex: Patient guided in maintaining alignment and muscle activation for pain reduction and proper muscle activation.     hooklying posterior pelvic tilts with adduction squeeze 10x3 second holds TrA activation in hooklying with single leg abduction; cueing for inhale during abduction and exhale during adduction of leg 12x each LE seated posterior pelvic tilt 15x ; cueing for foot placement for optimal muscle recruitment/body  mechanics Supine TrA activation with swiss ball 10x 3 second holds with focus on posterior pelvic tilt with activation  Seated scapular retractions 10x,  Standing swiss ball TrA activation pressing into ball with arms 10x 3 second holds Ambulate in gym s/p therex and manual with improved heel strike, step length, and arm swing.       E stim: module 2 level 51 ; 100 Hz, 200 [place] worn during interventions    Pt educated throughout session about proper posture and technique with exercises. Improved exercise technique, movement at target joints, use of target muscles after min to mod verbal, visual, tactile cues.                        PT Education - 08/11/19 0926    Education Details  TrA activation, exercise technique, manual    Person(s) Educated  Patient    Methods  Explanation;Demonstration;Tactile cues;Verbal cues    Comprehension  Verbalized understanding;Returned demonstration;Verbal cues required;Tactile cues required       PT Short Term Goals - 07/21/19 1500      PT SHORT TERM GOAL #1   Title  Patient will be independent in home exercise program to improve strength/mobility for better functional independence with ADLs.    Time  4    Period  Weeks    Status  New    Target Date  08/21/19      PT SHORT TERM GOAL #2   Title  After 4 weeks pt wil lreport consistet pain from sitting to standing up to 5 minutes without increaes.    Baseline  3-4 point increase from sitting to standing.    Time  4    Period  Weeks    Status  New    Target Date  08/21/19        PT Long Term Goals - 07/21/19 1501      PT LONG TERM GOAL #1   Title  Afte r8 weeks patient will improve walking tolerane to covering >1000 in 6MWT with LRAD.    Baseline  pain with limited community distance AMB    Time  8    Period  Weeks    Status  New    Target Date  09/20/19      PT LONG TERM GOAL #2   Title  After 8 weeks pt will report improved independence in self management of symptoms  excluding use of medication.    Baseline  currently uses TENS, Gabapentin, or heat.    Time  8    Period  Weeks    Status  New    Target Date  09/20/19      PT LONG TERM GOAL #3   Title  After 8 weeks pt to demonstrate improved sciatic neurodynamics AEB PSLR >60  degrees bilat without subjective sciatic tension.    Baseline  45 degrees Right, 60 degrees left    Time  8    Period  Weeks    Status  New    Target Date  09/20/19            Plan - 08/11/19 3662    Clinical Impression Statement  Patient presents to physical therapy with increased muscle tissue tension however is still much better than earlier sessions despite his activity level the day prior. Cross body activation continues to be limited at this time by pain, will continue to focus on TrA activation with same side and bilateral combined techniques rather than alternating. Patient will benefit from skilled physical therapy to improve posture, functional capacity for mobility, and pain reduction for improved quality of life    Personal Factors and Comorbidities  Behavior Pattern;Comorbidity 1;Past/Current Experience;Fitness;Time since onset of injury/illness/exacerbation    Comorbidities  Morbid Obesity    Examination-Activity Limitations  Bed Mobility;Squat;Stairs;Locomotion Level;Stand    Examination-Participation Restrictions  Shop;Community Activity;Yard Work;Meal Prep    Stability/Clinical Decision Making  Unstable/Unpredictable    Rehab Potential  Fair    Clinical Impairments Affecting Rehab Potential  (-) HTN, reflux, bilateral TKA, diabetes, history of chronic back pain,recent move (+) good family support and understanding of medical field    PT Frequency  2x / week    PT Duration  8 weeks    PT Treatment/Interventions  ADLs/Self Care Home Management;Aquatic Therapy;Cryotherapy;Electrical Stimulation;Iontophoresis 4mg /ml Dexamethasone;Moist Heat;Traction;Ultrasound;Balance training;Therapeutic exercise;Therapeutic  activities;Functional mobility training;Stair training;Gait training;Neuromuscular re-education;Patient/family education;Manual techniques;Passive range of motion;Dry needling;Energy conservation;Taping    PT Next Visit Plan  FU on response to flexion stretches; explore abdominal strength for HEP; knee jerk and ankle jerk reflexes.    PT Home Exercise Plan  seated floor touch streth 3x60sec 2x daily.    Consulted and Agree with Plan of Care  Patient       Patient will benefit from skilled therapeutic intervention in order to improve the following deficits and impairments:  Abnormal gait, Decreased activity tolerance, Decreased balance, Decreased endurance, Decreased mobility, Decreased range of motion, Difficulty walking, Decreased strength, Hypomobility, Impaired flexibility, Impaired perceived functional ability, Increased muscle spasms, Postural dysfunction, Pain, Increased fascial restricitons, Improper body mechanics, Obesity  Visit Diagnosis: Chronic midline low back pain without sciatica  Muscle weakness (generalized)     Problem List Patient Active Problem List   Diagnosis Date Noted  . Joint swelling 11/16/2018  . 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 Essential hypertension 09/09/2017  . Type 2 diabetes mellitus ([place]) 09/09/2017  . GERD (gastroesophageal reflux disease) 09/09/2017  . Testosterone deficiency 09/09/2017  . OSA (obstructive sleep apnea) 09/09/2017  . Chronic bilateral low back pain without sciatica 09/09/2017  . H/O knee surgery 04/27/2014  . 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

## 2019-08-12 ENCOUNTER — Other Ambulatory Visit: Payer: Self-pay | Admitting: Primary Care

## 2019-08-12 DIAGNOSIS — I10 Essential (primary) hypertension: Secondary | ICD-10-CM

## 2019-08-12 DIAGNOSIS — E1169 Type 2 diabetes mellitus with other specified complication: Secondary | ICD-10-CM

## 2019-08-12 DIAGNOSIS — E119 Type 2 diabetes mellitus without complications: Secondary | ICD-10-CM

## 2019-08-17 ENCOUNTER — Other Ambulatory Visit: Payer: Self-pay

## 2019-08-17 ENCOUNTER — Ambulatory Visit: Payer: Medicare Other | Attending: Family Medicine

## 2019-08-17 DIAGNOSIS — G8929 Other chronic pain: Secondary | ICD-10-CM | POA: Insufficient documentation

## 2019-08-17 DIAGNOSIS — M545 Low back pain, unspecified: Secondary | ICD-10-CM

## 2019-08-17 DIAGNOSIS — M6281 Muscle weakness (generalized): Secondary | ICD-10-CM | POA: Diagnosis not present

## 2019-08-17 DIAGNOSIS — M5386 Other specified dorsopathies, lumbar region: Secondary | ICD-10-CM | POA: Diagnosis not present

## 2019-08-17 NOTE — Therapy (Signed)
[place] MAIN [facility] SERVICES [address] [place], [place], [address] Phone: [phone]   Fax:  [phone]  Physical Therapy Treatment  Patient Details  Name: Adam Wise MRN: [id number] Date of Birth: 22-Aug-1951 No data recorded  Encounter Date: 08/17/2019  PT End of Session - 08/17/19 1640    Visit Number  8    Number of Visits  16    Date for PT Re-Evaluation  09/20/19    Authorization Type  Medicare    Authorization Time Period  07/21/19-09/20/19    Authorization - Visit Number  8    Authorization - Number of Visits  10    PT Start Time  1546    PT Stop Time  2778    PT Time Calculation (min)  45 min    Activity Tolerance  Patient tolerated treatment well;Patient limited by pain    Behavior During Therapy  [facility] for tasks assessed/performed       Past Medical History:  Diagnosis Date  . Chickenpox   . Chronic knee pain   . Chronic low back pain   . Essential hypertension   . GERD (gastroesophageal reflux disease)   . OSA (obstructive sleep apnea)   . Testosterone deficiency   . Type 2 diabetes mellitus ([place])     Past Surgical History:  Procedure Laterality Date  . COLONOSCOPY WITH PROPOFOL N/A 12/17/2018   Procedure: COLONOSCOPY WITH PROPOFOL;  Surgeon: Virgel Manifold, MD;  Location: [facility] ENDOSCOPY;  Service: Gastroenterology;  Laterality: N/A;  . JOINT REPLACEMENT Bilateral   . REPLACEMENT TOTAL KNEE BILATERAL  2015  . TONSILLECTOMY  1960    There were no vitals filed for this visit.  Subjective Assessment - 08/17/19 1549    Subjective  Patient reports he had 3 good days after last session. Yesterday was a bad day but today is a little better.  Has been compliant with HEP    Pertinent History  Pt suspects a 3-4 days after working out at [employer], pt started having severe "sharp" pain at the top of his gluteal cleft. He assumed it was his back, started going to chiro, initally better, but essentially unchanged,  persisted for 2x/week for about 6 weeks. Pt eventually was referred to DC, pt given a variety of pain medications (tramadol, 'oxy', flexaril), none of which helped.  Eventually saw the physiatrist who was questioning injections and/or ablasion. Pt also saw neurology Dr. Lacinda Axon who says patient has no operable issues as his diagnostic injections down to L5/S1 were higher than symptomatic level.    Limitations  Lifting;Standing;House hold activities    Currently in Pain?  Yes    Pain Score  4     Pain Location  Back    Pain Orientation  Lower    Pain Descriptors / Indicators  Aching    Pain Type  Chronic pain    Pain Onset  More than a month ago   Feb 2020   Pain Frequency  Constant         Manual Prone Mobilizations CPA, UPA L1-sacrum grade II-III, noted hypomobility 2x10 seconds each LE; mid and upper thoracic J sweep pressure for postural alignment x 20 seconds x5 trials Sacral inferior mobilizations, central and lateral 5x 15 second pulses of grade II mobilizations STM to R piriformis/gluteal musculature, lumbar paraspinals x 7 minutes with noted muscle tissue limitations with tenderness and trigger points noted. Implemented effleurage and ptrissage techniques along with use of metal tool for  reduction of muscle tissue tension and pain relief.    Supine:   Hamstring stretch with nerve glide df/pf 15x each LE Nerve glides with SLR/ df/pf 20x each LE.  Piriformis stretch hold 40 seconds.    Ther Ex: Patient guided in maintaining alignment and muscle activation for pain reduction and proper muscle activation.      hooklying posterior pelvic tilts with adduction squeeze 10x3 second holds TrA activation in hooklying with single leg abduction; cueing for inhale during abduction and exhale during adduction of leg 12x each LE seated posterior pelvic tilt 15x ; cueing for foot placement for optimal muscle recruitment/body mechanics Supine TrA activation with swiss ball 10x 3 second holds with  focus on posterior pelvic tilt with activation  Seated scapular retractions 10x,  Standing swiss ball TrA activation pressing into ball with arms 10x 3 second holds Seated hip flexion with TrA activation 9x each LE Ambulate in gym s/p therex and manual with improved heel strike, step length, and arm swing.       E stim: module 2 level 51 ; 100 Hz, 200 [place] worn during interventions    Pt educated throughout session about proper posture and technique with exercises. Improved exercise technique, movement at target joints, use of target muscles after min to mod verbal, visual, tactile cues.                        PT Education - 08/17/19 1610    Education Details  extercise technique, manual    Person(s) Educated  Patient    Methods  Explanation;Demonstration;Tactile cues;Verbal cues    Comprehension  Verbalized understanding;Returned demonstration;Verbal cues required;Tactile cues required       PT Short Term Goals - 07/21/19 1500      PT SHORT TERM GOAL #1   Title  Patient will be independent in home exercise program to improve strength/mobility for better functional independence with ADLs.    Time  4    Period  Weeks    Status  New    Target Date  08/21/19      PT SHORT TERM GOAL #2   Title  After 4 weeks pt wil lreport consistet pain from sitting to standing up to 5 minutes without increaes.    Baseline  3-4 point increase from sitting to standing.    Time  4    Period  Weeks    Status  New    Target Date  08/21/19        PT Long Term Goals - 07/21/19 1501      PT LONG TERM GOAL #1   Title  Afte r8 weeks patient will improve walking tolerane to covering >1000 in 6MWT with LRAD.    Baseline  pain with limited community distance AMB    Time  8    Period  Weeks    Status  New    Target Date  09/20/19      PT LONG TERM GOAL #2   Title  After 8 weeks pt will report improved independence in self management of symptoms excluding use of medication.     Baseline  currently uses TENS, Gabapentin, or heat.    Time  8    Period  Weeks    Status  New    Target Date  09/20/19      PT LONG TERM GOAL #3   Title  After 8 weeks pt to demonstrate improved sciatic neurodynamics AEB PSLR >  60 degrees bilat without subjective sciatic tension.    Baseline  45 degrees Right, 60 degrees left    Time  8    Period  Weeks    Status  New    Target Date  09/20/19            Plan - 08/17/19 1641    Clinical Impression Statement  Patient presents with hypomobility of upper lumbar/lower thoracic and limited muscle tissue length concurrent with guarding mechanisms. Patient requires prolonged gentle mobilizations and release for reduction of symptoms and decreased pain. Patient reports no pain by end of session. Patient will benefit from skilled physical therapy to improve posture, functional capacity for mobility, and pain reduction for improved quality of life    Personal Factors and Comorbidities  Behavior Pattern;Comorbidity 1;Past/Current Experience;Fitness;Time since onset of injury/illness/exacerbation    Comorbidities  Morbid Obesity    Examination-Activity Limitations  Bed Mobility;Squat;Stairs;Locomotion Level;Stand    Examination-Participation Restrictions  Shop;Community Activity;Yard Work;Meal Prep    Stability/Clinical Decision Making  Unstable/Unpredictable    Rehab Potential  Fair    Clinical Impairments Affecting Rehab Potential  (-) HTN, reflux, bilateral TKA, diabetes, history of chronic back pain,recent move (+) good family support and understanding of medical field    PT Frequency  2x / week    PT Duration  8 weeks    PT Treatment/Interventions  ADLs/Self Care Home Management;Aquatic Therapy;Cryotherapy;Electrical Stimulation;Iontophoresis 4mg /ml Dexamethasone;Moist Heat;Traction;Ultrasound;Balance training;Therapeutic exercise;Therapeutic activities;Functional mobility training;Stair training;Gait training;Neuromuscular  re-education;Patient/family education;Manual techniques;Passive range of motion;Dry needling;Energy conservation;Taping    PT Next Visit Plan  FU on response to flexion stretches; explore abdominal strength for HEP; knee jerk and ankle jerk reflexes.    PT Home Exercise Plan  seated floor touch streth 3x60sec 2x daily.    Consulted and Agree with Plan of Care  Patient       Patient will benefit from skilled therapeutic intervention in order to improve the following deficits and impairments:  Abnormal gait, Decreased activity tolerance, Decreased balance, Decreased endurance, Decreased mobility, Decreased range of motion, Difficulty walking, Decreased strength, Hypomobility, Impaired flexibility, Impaired perceived functional ability, Increased muscle spasms, Postural dysfunction, Pain, Increased fascial restricitons, Improper body mechanics, Obesity  Visit Diagnosis: Chronic midline low back pain without sciatica  Muscle weakness (generalized)     Problem List Patient Active Problem List   Diagnosis Date Noted  . Joint swelling 11/16/2018  . 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 Essential hypertension 09/09/2017  . Type 2 diabetes mellitus ([place]) 09/09/2017  . GERD (gastroesophageal reflux disease) 09/09/2017  . Testosterone deficiency 09/09/2017  . OSA (obstructive sleep apnea) 09/09/2017  . Chronic bilateral low back pain without sciatica 09/09/2017  . H/O knee surgery 04/27/2014  . 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 [place], [place], [address] Phone: [phone]   Fax:  [phone]  Name: Adam Wise MRN: [id number] Date of Birth: June 16, 1951

## 2019-08-19 ENCOUNTER — Ambulatory Visit: Payer: Medicare Other

## 2019-08-19 ENCOUNTER — Other Ambulatory Visit: Payer: Self-pay

## 2019-08-19 DIAGNOSIS — G8929 Other chronic pain: Secondary | ICD-10-CM | POA: Diagnosis not present

## 2019-08-19 DIAGNOSIS — M545 Low back pain: Secondary | ICD-10-CM | POA: Diagnosis not present

## 2019-08-19 DIAGNOSIS — M6281 Muscle weakness (generalized): Secondary | ICD-10-CM

## 2019-08-19 DIAGNOSIS — M5386 Other specified dorsopathies, lumbar region: Secondary | ICD-10-CM | POA: Diagnosis not present

## 2019-08-19 NOTE — Therapy (Signed)
[place] MAIN [facility] SERVICES [address] [place], [place], [address] Phone: [phone]   Fax:  [phone]  Physical Therapy Treatment  Patient Details  Name: Adam Wise MRN: [id number] Date of Birth: November 03, 1951 No data recorded  Encounter Date: 08/19/2019  PT End of Session - 08/19/19 1038    Visit Number  9    Number of Visits  16    Date for PT Re-Evaluation  09/20/19    Authorization Type  Medicare    Authorization Time Period  07/21/19-09/20/19    Authorization - Visit Number  9    Authorization - Number of Visits  10    PT Start Time  0955    PT Stop Time  1036    PT Time Calculation (min)  41 min    Activity Tolerance  Patient tolerated treatment well;Patient limited by pain    Behavior During Therapy  [facility] for tasks assessed/performed       Past Medical History:  Diagnosis Date  . Chickenpox   . Chronic knee pain   . Chronic low back pain   . Essential hypertension   . GERD (gastroesophageal reflux disease)   . OSA (obstructive sleep apnea)   . Testosterone deficiency   . Type 2 diabetes mellitus ([place])     Past Surgical History:  Procedure Laterality Date  . COLONOSCOPY WITH PROPOFOL N/A 12/17/2018   Procedure: COLONOSCOPY WITH PROPOFOL;  Surgeon: Virgel Manifold, MD;  Location: [facility] ENDOSCOPY;  Service: Gastroenterology;  Laterality: N/A;  . JOINT REPLACEMENT Bilateral   . REPLACEMENT TOTAL KNEE BILATERAL  2015  . TONSILLECTOMY  1960    There were no vitals filed for this visit.  Subjective Assessment - 08/19/19 1030    Subjective  Patient reports he is feeling more stiff this morning, potentially due to the time, is feeling tension in his low back on both sides.    Pertinent History  Pt suspects a 3-4 days after working out at [employer], pt started having severe "sharp" pain at the top of his gluteal cleft. He assumed it was his back, started going to chiro, initally better, but essentially unchanged, persisted  for 2x/week for about 6 weeks. Pt eventually was referred to DC, pt given a variety of pain medications (tramadol, 'oxy', flexaril), none of which helped.  Eventually saw the physiatrist who was questioning injections and/or ablasion. Pt also saw neurology Dr. Lacinda Axon who says patient has no operable issues as his diagnostic injections down to L5/S1 were higher than symptomatic level.    Limitations  Lifting;Standing;House hold activities    Currently in Pain?  Yes    Pain Score  4     Pain Location  Back    Pain Orientation  Lower    Pain Descriptors / Indicators  Aching    Pain Type  Chronic pain    Pain Onset  More than a month ago   Feb 2020   Pain Frequency  Constant           Manual Prone Mobilizations CPA, UPA L1-sacrum grade II-III, noted hypomobility 4x10 seconds each LE; mid and upper thoracic J sweep pressure for postural alignment x 20 seconds x5 trials Sacral inferior mobilizations, central and lateral 6x 15 second pulses of grade II mobilizations STM to quadratus and lumbar paraspinals x 12 minutes with noted muscle tissue limitations with tenderness and trigger points noted. Implemented effleurage and ptrissage techniques along with use of metal tool for reduction of  muscle tissue tension and pain relief.    Supine:   Hamstring stretch with nerve glide df/pf 15x each LE Nerve glides with SLR/ df/pf 20x each LE.  Piriformis stretch hold 60 seconds.     Ther Ex: Patient guided in maintaining alignment and muscle activation for pain reduction and proper muscle activation.  supine Piriformis stretch with towel AAROM 60 seconds hooklying posterior pelvic tilts with adduction squeeze 10x3 second holds TrA activation in hooklying with single leg abduction; cueing for inhale during abduction and exhale during adduction of leg 12x each LE seated posterior pelvic tilt 15x ; cueing for foot placement for optimal muscle recruitment/body mechanics Ambulate in gym s/p therex and  manual with improved heel strike, step length, and arm swing.   Stair hamstring stretch 30 seconds, stair hip flexor stretch 30 seconds   Pt educated throughout session about proper posture and technique with exercises. Improved exercise technique, movement at target joints, use of target muscles after min to mod verbal, visual, tactile cues.                       PT Education - 08/19/19 1030    Education Details  exercise technique, manual    Person(s) Educated  Patient    Methods  Explanation;Demonstration;Tactile cues;Verbal cues    Comprehension  Verbalized understanding;Returned demonstration;Verbal cues required;Tactile cues required       PT Short Term Goals - 07/21/19 1500      PT SHORT TERM GOAL #1   Title  Patient will be independent in home exercise program to improve strength/mobility for better functional independence with ADLs.    Time  4    Period  Weeks    Status  New    Target Date  08/21/19      PT SHORT TERM GOAL #2   Title  After 4 weeks pt wil lreport consistet pain from sitting to standing up to 5 minutes without increaes.    Baseline  3-4 point increase from sitting to standing.    Time  4    Period  Weeks    Status  New    Target Date  08/21/19        PT Long Term Goals - 07/21/19 1501      PT LONG TERM GOAL #1   Title  Afte r8 weeks patient will improve walking tolerane to covering >1000 in 6MWT with LRAD.    Baseline  pain with limited community distance AMB    Time  8    Period  Weeks    Status  New    Target Date  09/20/19      PT LONG TERM GOAL #2   Title  After 8 weeks pt will report improved independence in self management of symptoms excluding use of medication.    Baseline  currently uses TENS, Gabapentin, or heat.    Time  8    Period  Weeks    Status  New    Target Date  09/20/19      PT LONG TERM GOAL #3   Title  After 8 weeks pt to demonstrate improved sciatic neurodynamics AEB PSLR >60 degrees bilat without  subjective sciatic tension.    Baseline  45 degrees Right, 60 degrees left    Time  8    Period  Weeks    Status  New    Target Date  09/20/19  Plan - 08/19/19 1039    Clinical Impression Statement  Patient presents with increased tension in quadratus musculature requiring extensive STM and mobilizations for release. Patient reports pain reduction by end of session with improved biomechanics and posture. Patient is very limited by hamstring length resulting in tenderness and radiation to back with attempted self stretching. Patient will benefit from skilled physical therapy to improve posture, functional capacity for mobility, and pain reduction for improved quality of life    Personal Factors and Comorbidities  Behavior Pattern;Comorbidity 1;Past/Current Experience;Fitness;Time since onset of injury/illness/exacerbation    Comorbidities  Morbid Obesity    Examination-Activity Limitations  Bed Mobility;Squat;Stairs;Locomotion Level;Stand    Examination-Participation Restrictions  Shop;Community Activity;Yard Work;Meal Prep    Stability/Clinical Decision Making  Unstable/Unpredictable    Rehab Potential  Fair    Clinical Impairments Affecting Rehab Potential  (-) HTN, reflux, bilateral TKA, diabetes, history of chronic back pain,recent move (+) good family support and understanding of medical field    PT Frequency  2x / week    PT Duration  8 weeks    PT Treatment/Interventions  ADLs/Self Care Home Management;Aquatic Therapy;Cryotherapy;Electrical Stimulation;Iontophoresis 4mg /ml Dexamethasone;Moist Heat;Traction;Ultrasound;Balance training;Therapeutic exercise;Therapeutic activities;Functional mobility training;Stair training;Gait training;Neuromuscular re-education;Patient/family education;Manual techniques;Passive range of motion;Dry needling;Energy conservation;Taping    PT Next Visit Plan  FU on response to flexion stretches; explore abdominal strength for HEP; knee jerk and  ankle jerk reflexes.    PT Home Exercise Plan  seated floor touch streth 3x60sec 2x daily.    Consulted and Agree with Plan of Care  Patient       Patient will benefit from skilled therapeutic intervention in order to improve the following deficits and impairments:  Abnormal gait, Decreased activity tolerance, Decreased balance, Decreased endurance, Decreased mobility, Decreased range of motion, Difficulty walking, Decreased strength, Hypomobility, Impaired flexibility, Impaired perceived functional ability, Increased muscle spasms, Postural dysfunction, Pain, Increased fascial restricitons, Improper body mechanics, Obesity  Visit Diagnosis: Chronic midline low back pain without sciatica  Muscle weakness (generalized)     Problem List Patient Active Problem List   Diagnosis Date Noted  . Joint swelling 11/16/2018  . 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 Essential hypertension 09/09/2017  . Type 2 diabetes mellitus ([place]) 09/09/2017  . GERD (gastroesophageal reflux disease) 09/09/2017  . Testosterone deficiency 09/09/2017  . OSA (obstructive sleep apnea) 09/09/2017  . Chronic bilateral low back pain without sciatica 09/09/2017  . H/O knee surgery 04/27/2014  . 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 [place], [place], [address] Phone: [phone]   Fax:  [phone]  Name: Adam Wise MRN: [id number] Date of Birth: 1951-08-07

## 2019-08-23 ENCOUNTER — Ambulatory Visit: Payer: Medicare Other

## 2019-08-23 ENCOUNTER — Other Ambulatory Visit: Payer: Self-pay

## 2019-08-23 DIAGNOSIS — M6281 Muscle weakness (generalized): Secondary | ICD-10-CM

## 2019-08-23 DIAGNOSIS — M545 Low back pain, unspecified: Secondary | ICD-10-CM

## 2019-08-23 DIAGNOSIS — M5386 Other specified dorsopathies, lumbar region: Secondary | ICD-10-CM | POA: Diagnosis not present

## 2019-08-23 DIAGNOSIS — G8929 Other chronic pain: Secondary | ICD-10-CM | POA: Diagnosis not present

## 2019-08-23 NOTE — Therapy (Signed)
[place] MAIN [facility] SERVICES [address] [place], [place], [address] Phone: [phone]   Fax:  [phone]  Physical Therapy Treatment Physical Therapy Progress Note   Dates of reporting period  07/21/19 to  08/23/19  Patient Details  Name: Adam Wise MRN: [id number] Date of Birth: September 16, 1951 No data recorded  Encounter Date: 08/23/2019  PT End of Session - 08/23/19 0852    Visit Number  10    Number of Visits  16    Date for PT Re-Evaluation  09/20/19    Authorization Type  Medicare    Authorization Time Period  07/21/19-09/20/19; next session 1/10 Pn on 08/23/19    Authorization - Visit Number  10    Authorization - Number of Visits  10    PT Start Time  0846    PT Stop Time  0929    PT Time Calculation (min)  43 min    Activity Tolerance  Patient tolerated treatment well;Patient limited by pain    Behavior During Therapy  [facility] for tasks assessed/performed       Past Medical History:  Diagnosis Date  . Chickenpox   . Chronic knee pain   . Chronic low back pain   . Essential hypertension   . GERD (gastroesophageal reflux disease)   . OSA (obstructive sleep apnea)   . Testosterone deficiency   . Type 2 diabetes mellitus ([place])     Past Surgical History:  Procedure Laterality Date  . COLONOSCOPY WITH PROPOFOL N/A 12/17/2018   Procedure: COLONOSCOPY WITH PROPOFOL;  Surgeon: Virgel Manifold, MD;  Location: [facility] ENDOSCOPY;  Service: Gastroenterology;  Laterality: N/A;  . JOINT REPLACEMENT Bilateral   . REPLACEMENT TOTAL KNEE BILATERAL  2015  . TONSILLECTOMY  1960    There were no vitals filed for this visit.  Subjective Assessment - 08/23/19 0849    Subjective  Patient reports sunday was a bad day but saturday and monday were ok. Is feeling very stiff this morning from working on his tractor yesterday. Worst pain in past two weeks 6/10.    Pertinent History  Pt suspects a 3-4 days after working out at [employer], pt started having  severe "sharp" pain at the top of his gluteal cleft. He assumed it was his back, started going to chiro, initally better, but essentially unchanged, persisted for 2x/week for about 6 weeks. Pt eventually was referred to DC, pt given a variety of pain medications (tramadol, 'oxy', flexaril), none of which helped.  Eventually saw the physiatrist who was questioning injections and/or ablasion. Pt also saw neurology Dr. Lacinda Axon who says patient has no operable issues as his diagnostic injections down to L5/S1 were higher than symptomatic level.    Limitations  Lifting;Standing;House hold activities    Currently in Pain?  Yes    Pain Score  3     Pain Location  Back    Pain Orientation  Lower    Pain Descriptors / Indicators  Aching    Pain Type  Chronic pain    Pain Onset  More than a month ago   Feb 2020   Pain Frequency  Constant    Aggravating Factors   standing    Pain Relieving Factors  rest    Effect of Pain on Daily Activities  limits ADLs          Goals:  6 MWT: decline due to pain over the weekend.  Self management of pain Passive straight leg raise >60  degrees Pain from sitting to standing up to 5 minutes  VAS: current 3/10 worst pain 6/10  MODI: 20%    Manual Prone Mobilizations CPA, UPA L1-sacrum grade II-III, noted hypomobility 4x10 seconds each LE; mid and upper thoracic J sweep pressure for postural alignment x 20 seconds x5 trials Sacral inferior mobilizations, central and lateral 6x 15 second pulses of grade II mobilizations STM to quadratus and lumbar paraspinals x 9 minutes with noted muscle tissue limitations with tenderness and trigger points noted. Implemented effleurage and ptrissage techniques along with use of metal tool for reduction of muscle tissue tension and pain relief.    Supine:   Hamstring stretch with nerve glide df/pf 15x each LE Nerve glides with SLR/ df/pf 20x each LE.  Piriformis stretch hold 60 seconds.   IT band stretch 60 second holds    Ther  Ex: Patient guided in maintaining alignment and muscle activation for pain reduction and proper muscle activation.  supine Piriformis stretch with towel AAROM 60 seconds hooklying posterior pelvic tilts with adduction squeeze 10x3 second holds TrA activation in hooklying with single leg abduction; cueing for inhale during abduction and exhale during adduction of leg 12x each LE seated posterior pelvic tilt 15x ; cueing for foot placement for optimal muscle recruitment/body mechanics Ambulate in gym s/p therex and manual with improved heel strike, step length, and arm swing.    Pt educated throughout session about proper posture and technique with exercises. Improved exercise technique, movement at target joints, use of target muscles after min to mod verbal, visual, tactile cues.     Patient's condition has the potential to improve in response to therapy. Maximum improvement is yet to be obtained. The anticipated improvement is attainable and reasonable in a generally predictable time.  Patient reports he is having less pain on a daily basis and his average pain is now his best pain.                      PT Education - 08/23/19 [phone]    Education Details  exercise technique, manual, goals    Person(s) Educated  Patient    Methods  Explanation;Demonstration;Tactile cues;Verbal cues    Comprehension  Verbalized understanding;Returned demonstration;Verbal cues required;Tactile cues required       PT Short Term Goals - 08/23/19 0854      PT SHORT TERM GOAL #1   Title  Patient will be independent in home exercise program to improve strength/mobility for better functional independence with ADLs.    Baseline  9/8: HEP compliant    Time  4    Period  Weeks    Status  Achieved      PT SHORT TERM GOAL #2   Title  After 4 weeks pt will report consistant pain from sitting to standing up to 5 minutes without increases.    Baseline  pain increases from no pain to pain upon  standing.    Time  4    Period  Weeks    Status  On-going    Target Date  09/20/19        PT Long Term Goals - 08/23/19 0855      PT LONG TERM GOAL #1   Title  Afte r8 weeks patient will improve walking tolerane to covering >1000 in 6MWT with LRAD.    Baseline  pain with limited community distance AMB 9/8: patient deferred due to pain from weekend    Time  8  Period  Weeks    Status  On-going    Target Date  09/20/19      PT LONG TERM GOAL #2   Title  After 8 weeks pt will report improved independence in self management of symptoms excluding use of medication.    Baseline  currently uses TENS, Gabapentin, or heat 9/8: uses TENS and exercises to help,    Time  8    Period  Weeks    Status  Partially Met    Target Date  09/20/19      PT LONG TERM GOAL #3   Title  After 8 weeks pt to demonstrate improved sciatic neurodynamics AEB PSLR >60 degrees bilat without subjective sciatic tension.    Baseline  45 degrees Right, 60 degrees left    Time  8    Period  Weeks    Status  Partially Met            Plan - 08/23/19 1106    Clinical Impression Statement  Patient is progressing towards goals in regards to mobility and pain relief. Patient is having decreased level of pain with decreased episodes of higher pain. Patient requires prolonged gentle mobilizations and release for reduction of symptoms and decreased pain. Patient reports pain reduction by end of session with improved biomechanics and posture.Patient's condition has the potential to improve in response to therapy. Maximum improvement is yet to be obtained. The anticipated improvement is attainable and reasonable in a generally predictable time. Patient will benefit from skilled physical therapy to improve posture, functional capacity for mobility, and pain reduction for improved quality of life    Personal Factors and Comorbidities  Behavior Pattern;Comorbidity 1;Past/Current Experience;Fitness;Time since onset of  injury/illness/exacerbation    Comorbidities  Morbid Obesity    Examination-Activity Limitations  Bed Mobility;Squat;Stairs;Locomotion Level;Stand    Examination-Participation Restrictions  Shop;Community Activity;Yard Work;Meal Prep    Stability/Clinical Decision Making  Unstable/Unpredictable    Rehab Potential  Fair    Clinical Impairments Affecting Rehab Potential  (-) HTN, reflux, bilateral TKA, diabetes, history of chronic back pain,recent move (+) good family support and understanding of medical field    PT Frequency  2x / week    PT Duration  8 weeks    PT Treatment/Interventions  ADLs/Self Care Home Management;Aquatic Therapy;Cryotherapy;Electrical Stimulation;Iontophoresis '4mg'$ /ml Dexamethasone;Moist Heat;Traction;Ultrasound;Balance training;Therapeutic exercise;Therapeutic activities;Functional mobility training;Stair training;Gait training;Neuromuscular re-education;Patient/family education;Manual techniques;Passive range of motion;Dry needling;Energy conservation;Taping    PT Next Visit Plan  FU on response to flexion stretches; explore abdominal strength for HEP; knee jerk and ankle jerk reflexes.    PT Home Exercise Plan  seated floor touch streth 3x60sec 2x daily.    Consulted and Agree with Plan of Care  Patient       Patient will benefit from skilled therapeutic intervention in order to improve the following deficits and impairments:  Abnormal gait, Decreased activity tolerance, Decreased balance, Decreased endurance, Decreased mobility, Decreased range of motion, Difficulty walking, Decreased strength, Hypomobility, Impaired flexibility, Impaired perceived functional ability, Increased muscle spasms, Postural dysfunction, Pain, Increased fascial restricitons, Improper body mechanics, Obesity  Visit Diagnosis: Chronic midline low back pain without sciatica  Muscle weakness (generalized)     Problem List Patient Active Problem List   Diagnosis Date Noted  . Joint swelling  11/16/2018  . 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 Essential hypertension 09/09/2017  . Type 2 diabetes mellitus ([place]) 09/09/2017  . GERD (gastroesophageal reflux disease) 09/09/2017  . Testosterone deficiency 09/09/2017  . OSA (obstructive sleep apnea) 09/09/2017  .  Chronic bilateral low back pain without sciatica 09/09/2017  . H/O knee surgery 04/27/2014  . 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 [place], [place], [address] Phone: [phone]   Fax:  [phone]  Name: Kahli Fitzgerald MRN: [id number] Date of Birth: 08-19-1951

## 2019-08-25 ENCOUNTER — Ambulatory Visit: Payer: Medicare Other

## 2019-08-25 ENCOUNTER — Other Ambulatory Visit: Payer: Self-pay

## 2019-08-25 DIAGNOSIS — G8929 Other chronic pain: Secondary | ICD-10-CM | POA: Diagnosis not present

## 2019-08-25 DIAGNOSIS — M5386 Other specified dorsopathies, lumbar region: Secondary | ICD-10-CM | POA: Diagnosis not present

## 2019-08-25 DIAGNOSIS — M6281 Muscle weakness (generalized): Secondary | ICD-10-CM

## 2019-08-25 DIAGNOSIS — M545 Low back pain, unspecified: Secondary | ICD-10-CM

## 2019-08-25 NOTE — Therapy (Signed)
[place] MAIN [facility] SERVICES [address] [place], [place], [address] Phone: [phone]   Fax:  [phone]  Physical Therapy Treatment  Patient Details  Name: Adam Wise MRN: [id number] Date of Birth: Jul 03, 1951 No data recorded  Encounter Date: 08/25/2019  PT End of Session - 08/25/19 1221    Visit Number  11    Number of Visits  16    Date for PT Re-Evaluation  09/20/19    Authorization Type  Medicare    Authorization Time Period  1/10 Pn on 08/23/19    PT Start Time  1100    PT Stop Time  1146    PT Time Calculation (min)  46 min    Activity Tolerance  Patient tolerated treatment well;Patient limited by pain    Behavior During Therapy  [facility] for tasks assessed/performed       Past Medical History:  Diagnosis Date  . Chickenpox   . Chronic knee pain   . Chronic low back pain   . Essential hypertension   . GERD (gastroesophageal reflux disease)   . OSA (obstructive sleep apnea)   . Testosterone deficiency   . Type 2 diabetes mellitus ([place])     Past Surgical History:  Procedure Laterality Date  . COLONOSCOPY WITH PROPOFOL N/A 12/17/2018   Procedure: COLONOSCOPY WITH PROPOFOL;  Surgeon: Virgel Manifold, MD;  Location: [facility] ENDOSCOPY;  Service: Gastroenterology;  Laterality: N/A;  . JOINT REPLACEMENT Bilateral   . REPLACEMENT TOTAL KNEE BILATERAL  2015  . TONSILLECTOMY  1960    There were no vitals filed for this visit.  Subjective Assessment - 08/25/19 1208    Subjective  Patient presents with tightness of his IT band. Is having less pain but continues to have some pain and stiffness that is worse in the mornings. Compliant with HEP.    Pertinent History  Pt suspects a 3-4 days after working out at [employer], pt started having severe "sharp" pain at the top of his gluteal cleft. He assumed it was his back, started going to chiro, initally better, but essentially unchanged, persisted for 2x/week for about 6 weeks. Pt  eventually was referred to DC, pt given a variety of pain medications (tramadol, 'oxy', flexaril), none of which helped.  Eventually saw the physiatrist who was questioning injections and/or ablasion. Pt also saw neurology Dr. Lacinda Axon who says patient has no operable issues as his diagnostic injections down to L5/S1 were higher than symptomatic level.    Limitations  Lifting;Standing;House hold activities    Currently in Pain?  Yes    Pain Score  2     Pain Location  Back    Pain Orientation  Lower    Pain Descriptors / Indicators  Aching    Pain Type  Chronic pain    Pain Onset  More than a month ago   Feb 2020   Pain Frequency  Constant       Manual Prone Mobilizations CPA, UPA L1-sacrum grade II-III, noted hypomobility 4x10 seconds each LE; mid and upper thoracic J sweep pressure for postural alignment x 20 seconds x5 trials Sacral inferior mobilizations, central and lateral 6x 15 second pulses of grade II mobilizations STM to quadratus and lumbar paraspinals x 9 minutes with noted muscle tissue limitations with tenderness and trigger points noted. Implemented effleurage and ptrissage techniques along with use of metal tool for reduction of muscle tissue tension and pain relief.    Supine:  SAD with belt inferior  BLE, 4x 20 second holds  Piriformis stretch into figure 4 with SAD inferior with belt 4x 20 second holds   IT band stretch 60 second holds , rollout with roller 60 seconds  IT band STM cross friction and along the tissue line with use of metal tool x 3 minutes, patient reporting great relief of symptoms.   Ther Ex: Patient guided in maintaining alignment and muscle activation for pain reduction and proper muscle activation.  Modified open book stretch: progressing from supine to sidelying with altering positions of UE's.  seated posterior pelvic tilt 15x ; cueing for foot placement for optimal muscle recruitment/body mechanics Ambulate in gym s/p therex and manual with improved  heel strike, step length, and arm swing.     Pt educated throughout session about proper posture and technique with exercises. Improved exercise technique, movement at target joints, use of target muscles after min to mod verbal, visual, tactile cues.                       PT Education - 08/25/19 1219    Education Details  exercise technique, manual, IT band reduction/rolling    Person(s) Educated  Patient    Methods  Explanation;Demonstration;Tactile cues;Verbal cues    Comprehension  Verbalized understanding;Returned demonstration;Verbal cues required;Tactile cues required       PT Short Term Goals - 08/23/19 0854      PT SHORT TERM GOAL #1   Title  Patient will be independent in home exercise program to improve strength/mobility for better functional independence with ADLs.    Baseline  9/8: HEP compliant    Time  4    Period  Weeks    Status  Achieved      PT SHORT TERM GOAL #2   Title  After 4 weeks pt will report consistant pain from sitting to standing up to 5 minutes without increases.    Baseline  pain increases from no pain to pain upon standing.    Time  4    Period  Weeks    Status  On-going    Target Date  09/20/19        PT Long Term Goals - 08/23/19 0855      PT LONG TERM GOAL #1   Title  Afte r8 weeks patient will improve walking tolerane to covering >1000 in 6MWT with LRAD.    Baseline  pain with limited community distance AMB 9/8: patient deferred due to pain from weekend    Time  8    Period  Weeks    Status  On-going    Target Date  09/20/19      PT LONG TERM GOAL #2   Title  After 8 weeks pt will report improved independence in self management of symptoms excluding use of medication.    Baseline  currently uses TENS, Gabapentin, or heat 9/8: uses TENS and exercises to help,    Time  8    Period  Weeks    Status  Partially Met    Target Date  09/20/19      PT LONG TERM GOAL #3   Title  After 8 weeks pt to demonstrate improved  sciatic neurodynamics AEB PSLR >60 degrees bilat without subjective sciatic tension.    Baseline  45 degrees Right, 60 degrees left    Time  8    Period  Weeks    Status  Partially Met  Plan - 08/25/19 1222    Clinical Impression Statement  Patient's pain reduced with manual and use of metal tool on IT band with decreased muscle tension noted and decreased radiating pain. Patient educated on use of a roller at home for IT band reduction as well as continued focus on HEP. Patient will benefit from skilled physical therapy to improve posture, functional capacity for mobility, and pain reduction for improved quality of life    Personal Factors and Comorbidities  Behavior Pattern;Comorbidity 1;Past/Current Experience;Fitness;Time since onset of injury/illness/exacerbation    Comorbidities  Morbid Obesity    Examination-Activity Limitations  Bed Mobility;Squat;Stairs;Locomotion Level;Stand    Examination-Participation Restrictions  Shop;Community Activity;Yard Work;Meal Prep    Stability/Clinical Decision Making  Unstable/Unpredictable    Rehab Potential  Fair    Clinical Impairments Affecting Rehab Potential  (-) HTN, reflux, bilateral TKA, diabetes, history of chronic back pain,recent move (+) good family support and understanding of medical field    PT Frequency  2x / week    PT Duration  8 weeks    PT Treatment/Interventions  ADLs/Self Care Home Management;Aquatic Therapy;Cryotherapy;Electrical Stimulation;Iontophoresis 71m/ml Dexamethasone;Moist Heat;Traction;Ultrasound;Balance training;Therapeutic exercise;Therapeutic activities;Functional mobility training;Stair training;Gait training;Neuromuscular re-education;Patient/family education;Manual techniques;Passive range of motion;Dry needling;Energy conservation;Taping    PT Next Visit Plan  FU on response to flexion stretches; explore abdominal strength for HEP; knee jerk and ankle jerk reflexes.    PT Home Exercise Plan  seated  floor touch streth 3x60sec 2x daily.    Consulted and Agree with Plan of Care  Patient       Patient will benefit from skilled therapeutic intervention in order to improve the following deficits and impairments:  Abnormal gait, Decreased activity tolerance, Decreased balance, Decreased endurance, Decreased mobility, Decreased range of motion, Difficulty walking, Decreased strength, Hypomobility, Impaired flexibility, Impaired perceived functional ability, Increased muscle spasms, Postural dysfunction, Pain, Increased fascial restricitons, Improper body mechanics, Obesity  Visit Diagnosis: Chronic midline low back pain without sciatica  Muscle weakness (generalized)     Problem List Patient Active Problem List   Diagnosis Date Noted  . Joint swelling 11/16/2018  . Pit[place] ([place] 11/25/2017  . Essential hypertension 09/09/2017  . [place] diabetes [place] ([place] 09/09/2017  . GERD (gastroesophageal reflux disease) 09/09/2017  . Testosterone deficiency 09/09/2017  . OSA (obstructive sleep apnea) 09/09/2017  . Chronic bilateral low back pain without sciatica 09/09/2017  . H/O knee surgery 04/27/2014  . Hyp[place]otropic [place]ogonadism ([place] 04/05/2014   MJanna Arch PT, DPT   08/25/2019, 12:23 PM  [place] [facility] [address] [address]Phone: [phone]  Fax:  [phone] Name: SMykale GandolfoMRN: [id number] of Birth: 02/02/1951-05-12

## 2019-08-30 ENCOUNTER — Other Ambulatory Visit: Payer: Self-pay

## 2019-08-30 ENCOUNTER — Ambulatory Visit: Payer: Medicare Other

## 2019-08-30 DIAGNOSIS — M545 Low back pain, unspecified: Secondary | ICD-10-CM

## 2019-08-30 DIAGNOSIS — M6281 Muscle weakness (generalized): Secondary | ICD-10-CM | POA: Diagnosis not present

## 2019-08-30 DIAGNOSIS — M5386 Other specified dorsopathies, lumbar region: Secondary | ICD-10-CM | POA: Diagnosis not present

## 2019-08-30 DIAGNOSIS — G8929 Other chronic pain: Secondary | ICD-10-CM

## 2019-08-30 NOTE — Therapy (Signed)
[place] MAIN [facility] SERVICES [address] [place], [place], [address] Phone: [phone]   Fax:  [phone]  Physical Therapy Treatment  Patient Details  Name: Adam Wise MRN: [id number] Date of Birth: 05-01-1951 No data recorded  Encounter Date: 08/30/2019  PT End of Session - 08/30/19 1523    Visit Number  12    Number of Visits  16    Date for PT Re-Evaluation  09/20/19    Authorization Type  Medicare    Authorization Time Period  2/10 Pn on 08/23/19    PT Start Time  1300    PT Stop Time  1344    PT Time Calculation (min)  44 min    Activity Tolerance  Patient tolerated treatment well;Patient limited by pain    Behavior During Therapy  [facility] for tasks assessed/performed       Past Medical History:  Diagnosis Date  . Chickenpox   . Chronic knee pain   . Chronic low back pain   . Essential hypertension   . GERD (gastroesophageal reflux disease)   . OSA (obstructive sleep apnea)   . Testosterone deficiency   . Type 2 diabetes mellitus ([place])     Past Surgical History:  Procedure Laterality Date  . COLONOSCOPY WITH PROPOFOL N/A 12/17/2018   Procedure: COLONOSCOPY WITH PROPOFOL;  Surgeon: Virgel Manifold, MD;  Location: [facility] ENDOSCOPY;  Service: Gastroenterology;  Laterality: N/A;  . JOINT REPLACEMENT Bilateral   . REPLACEMENT TOTAL KNEE BILATERAL  2015  . TONSILLECTOMY  1960    There were no vitals filed for this visit.  Subjective Assessment - 08/30/19 1354    Subjective  Patient had multiple "good days" since last session. He was able to work on his tractor, mow his lawn, and perform other tasks with manageable pain levels. Patient has been compliant    Pertinent History  Pt suspects a 3-4 days after working out at [employer], pt started having severe "sharp" pain at the top of his gluteal cleft. He assumed it was his back, started going to chiro, initally better, but essentially unchanged, persisted for 2x/week for about 6  weeks. Pt eventually was referred to DC, pt given a variety of pain medications (tramadol, 'oxy', flexaril), none of which helped.  Eventually saw the physiatrist who was questioning injections and/or ablasion. Pt also saw neurology Dr. Lacinda Axon who says patient has no operable issues as his diagnostic injections down to L5/S1 were higher than symptomatic level.    Limitations  Lifting;Standing;House hold activities    Pain Onset  More than a month ago   Feb 2020     Patietn reports he had a good couple of days after last session. Worked on a tractor mowed the yard with limited symptoms. Symptoms began back this morning, has been compliant with HEP. IT band helped last session.    Manual Prone Mobilizations CPA, UPA L1-sacrum grade II-III, noted hypomobility 4x10 seconds each LE; mid and upper thoracic J sweep pressure for postural alignment x 20 seconds x5 trials Sacral inferior mobilizations, central and lateral 6x 15 second pulses of grade II mobilizations STM to quadratus and lumbar paraspinals x 9 minutes with noted muscle tissue limitations with tenderness and trigger points noted. Implemented effleurage and ptrissage techniques along with use of metal tool/ roller  for reduction of muscle tissue tension and pain relief.    Supine:    IT band stretch 60 second holds , rollout with roller 60 seconds  IT band STM cross friction and along the tissue line with use of metal tool x 3 minutes, patient reporting great relief of symptoms.    Ther Ex: Patient guided in maintaining alignment and muscle activation for pain reduction and proper muscle activation.   seated posterior pelvic tilt 15x ; cueing for foot placement for optimal muscle recruitment/body mechanics Ambulate in gym s/p therex and manual with improved heel strike, step length, and arm swing.   Standing wall posture : posterior pelvic tilt towards wall 6x 3 second holds Sit to stands: Seated posterior pelvic tilt with sit to stand x  10, decreased hinging with repetitions GTB row seated with posterior pelvic tilt and TrA activaiton   Pt educated throughout session about proper posture and technique with exercises. Improved exercise technique, movement at target joints, use of target muscles after min to mod verbal, visual, tactile cues.                        PT Education - 08/30/19 1523    Education Details  IT band lengthening, exercise technique, manual    Person(s) Educated  Patient    Methods  Explanation;Demonstration;Tactile cues;Verbal cues    Comprehension  Verbalized understanding;Returned demonstration;Verbal cues required;Tactile cues required       PT Short Term Goals - 08/23/19 0854      PT SHORT TERM GOAL #1   Title  Patient will be independent in home exercise program to improve strength/mobility for better functional independence with ADLs.    Baseline  9/8: HEP compliant    Time  4    Period  Weeks    Status  Achieved      PT SHORT TERM GOAL #2   Title  After 4 weeks pt will report consistant pain from sitting to standing up to 5 minutes without increases.    Baseline  pain increases from no pain to pain upon standing.    Time  4    Period  Weeks    Status  On-going    Target Date  09/20/19        PT Long Term Goals - 08/23/19 0855      PT LONG TERM GOAL #1   Title  Afte r8 weeks patient will improve walking tolerane to covering >1000 in 6MWT with LRAD.    Baseline  pain with limited community distance AMB 9/8: patient deferred due to pain from weekend    Time  8    Period  Weeks    Status  On-going    Target Date  09/20/19      PT LONG TERM GOAL #2   Title  After 8 weeks pt will report improved independence in self management of symptoms excluding use of medication.    Baseline  currently uses TENS, Gabapentin, or heat 9/8: uses TENS and exercises to help,    Time  8    Period  Weeks    Status  Partially Met    Target Date  09/20/19      PT LONG TERM GOAL  #3   Title  After 8 weeks pt to demonstrate improved sciatic neurodynamics AEB PSLR >60 degrees bilat without subjective sciatic tension.    Baseline  45 degrees Right, 60 degrees left    Time  8    Period  Weeks    Status  Partially Met            Plan - 08/30/19 1524  Clinical Impression Statement  Patient presents with improved muscle tissue length of paraspinals and mobility of spine. He is progressing with ability to perform pelvic tilts in varying positions for optimal spinal alignment and positioning. IT band continues to be an area of high irritation that improved with manual and exercise. Patient will benefit from skilled physical therapy to improve posture, functional capacity for mobility, and pain reduction for improved quality of life    Personal Factors and Comorbidities  Behavior Pattern;Comorbidity 1;Past/Current Experience;Fitness;Time since onset of injury/illness/exacerbation    Comorbidities  Morbid Obesity    Examination-Activity Limitations  Bed Mobility;Squat;Stairs;Locomotion Level;Stand    Examination-Participation Restrictions  Shop;Community Activity;Yard Work;Meal Prep    Stability/Clinical Decision Making  Unstable/Unpredictable    Rehab Potential  Fair    Clinical Impairments Affecting Rehab Potential  (-) HTN, reflux, bilateral TKA, diabetes, history of chronic back pain,recent move (+) good family support and understanding of medical field    PT Frequency  2x / week    PT Duration  8 weeks    PT Treatment/Interventions  ADLs/Self Care Home Management;Aquatic Therapy;Cryotherapy;Electrical Stimulation;Iontophoresis '4mg'$ /ml Dexamethasone;Moist Heat;Traction;Ultrasound;Balance training;Therapeutic exercise;Therapeutic activities;Functional mobility training;Stair training;Gait training;Neuromuscular re-education;Patient/family education;Manual techniques;Passive range of motion;Dry needling;Energy conservation;Taping    PT Next Visit Plan  FU on response to  flexion stretches; explore abdominal strength for HEP; knee jerk and ankle jerk reflexes.    PT Home Exercise Plan  seated floor touch streth 3x60sec 2x daily.    Consulted and Agree with Plan of Care  Patient       Patient will benefit from skilled therapeutic intervention in order to improve the following deficits and impairments:  Abnormal gait, Decreased activity tolerance, Decreased balance, Decreased endurance, Decreased mobility, Decreased range of motion, Difficulty walking, Decreased strength, Hypomobility, Impaired flexibility, Impaired perceived functional ability, Increased muscle spasms, Postural dysfunction, Pain, Increased fascial restricitons, Improper body mechanics, Obesity  Visit Diagnosis: Chronic midline low back pain without sciatica  Muscle weakness (generalized)     Problem List Patient Active Problem List   Diagnosis Date Noted  . Joint swelling 11/16/2018  . 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 Essential hypertension 09/09/2017  . Type 2 diabetes mellitus ([place]) 09/09/2017  . GERD (gastroesophageal reflux disease) 09/09/2017  . Testosterone deficiency 09/09/2017  . OSA (obstructive sleep apnea) 09/09/2017  . Chronic bilateral low back pain without sciatica 09/09/2017  . H/O knee surgery 04/27/2014  . 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 [place], [place], [address] Phone: [phone]   Fax:  [phone]  Name: Adam Wise MRN: [id number] Date of Birth: 1951-11-21

## 2019-09-01 ENCOUNTER — Other Ambulatory Visit: Payer: Self-pay

## 2019-09-01 ENCOUNTER — Ambulatory Visit: Payer: Medicare Other

## 2019-09-01 DIAGNOSIS — M545 Low back pain, unspecified: Secondary | ICD-10-CM

## 2019-09-01 DIAGNOSIS — M6281 Muscle weakness (generalized): Secondary | ICD-10-CM | POA: Diagnosis not present

## 2019-09-01 DIAGNOSIS — M5386 Other specified dorsopathies, lumbar region: Secondary | ICD-10-CM | POA: Diagnosis not present

## 2019-09-01 DIAGNOSIS — G8929 Other chronic pain: Secondary | ICD-10-CM | POA: Diagnosis not present

## 2019-09-01 NOTE — Therapy (Signed)
[place] MAIN [facility] SERVICES [address] [place], [place], [address] Phone: [phone]   Fax:  [phone]  Physical Therapy Treatment  Patient Details  Name: Adam Wise MRN: [id number] Date of Birth: Jan 09, 1951 No data recorded  Encounter Date: 09/01/2019  PT End of Session - 09/01/19 1345    Visit Number  13    Number of Visits  16    Date for PT Re-Evaluation  09/20/19    Authorization Type  Medicare    Authorization Time Period  3/10 Pn on 08/23/19    PT Start Time  1300    PT Stop Time  1344    PT Time Calculation (min)  44 min    Activity Tolerance  Patient tolerated treatment well;Patient limited by pain    Behavior During Therapy  [facility] for tasks assessed/performed       Past Medical History:  Diagnosis Date  . Chickenpox   . Chronic knee pain   . Chronic low back pain   . Essential hypertension   . GERD (gastroesophageal reflux disease)   . OSA (obstructive sleep apnea)   . Testosterone deficiency   . Type 2 diabetes mellitus ([place])     Past Surgical History:  Procedure Laterality Date  . COLONOSCOPY WITH PROPOFOL N/A 12/17/2018   Procedure: COLONOSCOPY WITH PROPOFOL;  Surgeon: Virgel Manifold, MD;  Location: [facility] ENDOSCOPY;  Service: Gastroenterology;  Laterality: N/A;  . JOINT REPLACEMENT Bilateral   . REPLACEMENT TOTAL KNEE BILATERAL  2015  . TONSILLECTOMY  1960    There were no vitals filed for this visit.  Subjective Assessment - 09/01/19 1344    Subjective  Patient reports he felt a little stiffer in his left low back this morning. Has been compliant with HEP.    Pertinent History  Pt suspects a 3-4 days after working out at [employer], pt started having severe "sharp" pain at the top of his gluteal cleft. He assumed it was his back, started going to chiro, initally better, but essentially unchanged, persisted for 2x/week for about 6 weeks. Pt eventually was referred to DC, pt given a variety of pain  medications (tramadol, 'oxy', flexaril), none of which helped.  Eventually saw the physiatrist who was questioning injections and/or ablasion. Pt also saw neurology Dr. Lacinda Axon who says patient has no operable issues as his diagnostic injections down to L5/S1 were higher than symptomatic level.    Limitations  Lifting;Standing;House hold activities    Currently in Pain?  Yes    Pain Score  2     Pain Location  Back    Pain Orientation  Lower    Pain Descriptors / Indicators  Aching    Pain Type  Chronic pain    Pain Onset  More than a month ago   Feb 2020   Pain Frequency  Constant             Manual Prone Mobilizations CPA, UPA L1-sacrum grade II-III, noted hypomobility 4x10 seconds each LE; mid and upper thoracic J sweep pressure for postural alignment x 20 seconds x5 trials Sacral inferior mobilizations, central and lateral 6x 15 second pulses of grade II mobilizations STM to quadratus and lumbar paraspinals x 9 minutes with noted muscle tissue limitations with tenderness and trigger points noted. Implemented effleurage and ptrissage techniques along with use of metal tool  for reduction of muscle tissue tension and pain relief.    Supine:     IT band stretch 60  second holds , rollout with roller 60 seconds  IT band STM cross friction and along the tissue line with use of metal tool x 5 minutes, patient reporting great relief of symptoms.    Ther Ex: Patient guided in maintaining alignment and muscle activation for pain reduction and proper muscle activation.   Supine posterior pelvic tilt with adduction ball squeeze 10x 3 second holds seated posterior pelvic tilt 15x ; cueing for foot placement for optimal muscle recruitment/body mechanics Ambulate in gym s/p therex and manual with improved heel strike, step length, and arm swing.  Sit to stands: Seated posterior pelvic tilt with sit to stand x 10, decreased hinging with repetitions GTB row seated with posterior pelvic tilt and TrA  activaiton    Pt educated throughout session about proper posture and technique with exercises. Improved exercise technique, movement at target joints, use of target muscles after min to mod verbal, visual, tactile cues.                      PT Education - 09/01/19 1345    Education Details  exercise technique, manual, IT band    Person(s) Educated  Patient    Methods  Explanation;Demonstration;Tactile cues;Verbal cues    Comprehension  Verbalized understanding;Returned demonstration;Verbal cues required;Tactile cues required       PT Short Term Goals - 08/23/19 0854      PT SHORT TERM GOAL #1   Title  Patient will be independent in home exercise program to improve strength/mobility for better functional independence with ADLs.    Baseline  9/8: HEP compliant    Time  4    Period  Weeks    Status  Achieved      PT SHORT TERM GOAL #2   Title  After 4 weeks pt will report consistant pain from sitting to standing up to 5 minutes without increases.    Baseline  pain increases from no pain to pain upon standing.    Time  4    Period  Weeks    Status  On-going    Target Date  09/20/19        PT Long Term Goals - 08/23/19 0855      PT LONG TERM GOAL #1   Title  Afte r8 weeks patient will improve walking tolerane to covering >1000 in 6MWT with LRAD.    Baseline  pain with limited community distance AMB 9/8: patient deferred due to pain from weekend    Time  8    Period  Weeks    Status  On-going    Target Date  09/20/19      PT LONG TERM GOAL #2   Title  After 8 weeks pt will report improved independence in self management of symptoms excluding use of medication.    Baseline  currently uses TENS, Gabapentin, or heat 9/8: uses TENS and exercises to help,    Time  8    Period  Weeks    Status  Partially Met    Target Date  09/20/19      PT LONG TERM GOAL #3   Title  After 8 weeks pt to demonstrate improved sciatic neurodynamics AEB PSLR >60 degrees bilat  without subjective sciatic tension.    Baseline  45 degrees Right, 60 degrees left    Time  8    Period  Weeks    Status  Partially Met  Plan - 09/01/19 1346    Clinical Impression Statement  Patient presents to physical therapy with good motivation. Has noticeable trigger points/muscle knots in bilateral paraspinal and quadratus musculature. Extensive work on IT band resulted in reduction of symptoms and improved step length with proper heel strike. Patient will benefit from skilled physical therapy to improve posture, functional capacity for mobility, and pain reduction for improved quality of life    Personal Factors and Comorbidities  Behavior Pattern;Comorbidity 1;Past/Current Experience;Fitness;Time since onset of injury/illness/exacerbation    Comorbidities  Morbid Obesity    Examination-Activity Limitations  Bed Mobility;Squat;Stairs;Locomotion Level;Stand    Examination-Participation Restrictions  Shop;Community Activity;Yard Work;Meal Prep    Stability/Clinical Decision Making  Unstable/Unpredictable    Rehab Potential  Fair    Clinical Impairments Affecting Rehab Potential  (-) HTN, reflux, bilateral TKA, diabetes, history of chronic back pain,recent move (+) good family support and understanding of medical field    PT Frequency  2x / week    PT Duration  8 weeks    PT Treatment/Interventions  ADLs/Self Care Home Management;Aquatic Therapy;Cryotherapy;Electrical Stimulation;Iontophoresis '4mg'$ /ml Dexamethasone;Moist Heat;Traction;Ultrasound;Balance training;Therapeutic exercise;Therapeutic activities;Functional mobility training;Stair training;Gait training;Neuromuscular re-education;Patient/family education;Manual techniques;Passive range of motion;Dry needling;Energy conservation;Taping    PT Next Visit Plan  FU on response to flexion stretches; explore abdominal strength for HEP; knee jerk and ankle jerk reflexes.    PT Home Exercise Plan  seated floor touch streth  3x60sec 2x daily.    Consulted and Agree with Plan of Care  Patient       Patient will benefit from skilled therapeutic intervention in order to improve the following deficits and impairments:  Abnormal gait, Decreased activity tolerance, Decreased balance, Decreased endurance, Decreased mobility, Decreased range of motion, Difficulty walking, Decreased strength, Hypomobility, Impaired flexibility, Impaired perceived functional ability, Increased muscle spasms, Postural dysfunction, Pain, Increased fascial restricitons, Improper body mechanics, Obesity  Visit Diagnosis: Chronic midline low back pain without sciatica  Muscle weakness (generalized)  Chronic bilateral low back pain without sciatica     Problem List Patient Active Problem List   Diagnosis Date Noted  . Joint swelling 11/16/2018  . 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 Essential hypertension 09/09/2017  . Type 2 diabetes mellitus ([place]) 09/09/2017  . GERD (gastroesophageal reflux disease) 09/09/2017  . Testosterone deficiency 09/09/2017  . OSA (obstructive sleep apnea) 09/09/2017  . Chronic bilateral low back pain without sciatica 09/09/2017  . H/O knee surgery 04/27/2014  . 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 [place], [place], [address] Phone: [phone]   Fax:  [phone]  Name: Adam Wise MRN: [id number] Date of Birth: [birth year]

## 2019-09-06 ENCOUNTER — Ambulatory Visit: Payer: Medicare Other | Admitting: Physical Therapy

## 2019-09-08 ENCOUNTER — Ambulatory Visit: Payer: Medicare Other

## 2019-09-08 ENCOUNTER — Other Ambulatory Visit: Payer: Self-pay

## 2019-09-08 DIAGNOSIS — G8929 Other chronic pain: Secondary | ICD-10-CM

## 2019-09-08 DIAGNOSIS — M5386 Other specified dorsopathies, lumbar region: Secondary | ICD-10-CM | POA: Diagnosis not present

## 2019-09-08 DIAGNOSIS — M6281 Muscle weakness (generalized): Secondary | ICD-10-CM

## 2019-09-08 DIAGNOSIS — M545 Low back pain, unspecified: Secondary | ICD-10-CM

## 2019-09-08 NOTE — Therapy (Signed)
[place] MAIN [facility] SERVICES [address] [place], [place], [address] Phone: [phone]   Fax:  [phone]  Physical Therapy Treatment  Patient Details  Name: Adam Wise MRN: [id number] Date of Birth: 12/19/1950 No data recorded  Encounter Date: 09/08/2019  PT End of Session - 09/08/19 1258    Visit Number  14    Number of Visits  16    Date for PT Re-Evaluation  09/20/19    Authorization Type  Medicare    Authorization Time Period  4/10 Pn on 08/23/19    PT Start Time  1030    PT Stop Time  1120    PT Time Calculation (min)  50 min    Activity Tolerance  Patient tolerated treatment well;Patient limited by pain    Behavior During Therapy  [facility] for tasks assessed/performed       Past Medical History:  Diagnosis Date  . Chickenpox   . Chronic knee pain   . Chronic low back pain   . Essential hypertension   . GERD (gastroesophageal reflux disease)   . OSA (obstructive sleep apnea)   . Testosterone deficiency   . Type 2 diabetes mellitus ([place])     Past Surgical History:  Procedure Laterality Date  . COLONOSCOPY WITH PROPOFOL N/A 12/17/2018   Procedure: COLONOSCOPY WITH PROPOFOL;  Surgeon: Virgel Manifold, MD;  Location: [facility] ENDOSCOPY;  Service: Gastroenterology;  Laterality: N/A;  . JOINT REPLACEMENT Bilateral   . REPLACEMENT TOTAL KNEE BILATERAL  2015  . TONSILLECTOMY  1960    There were no vitals filed for this visit.  Subjective Assessment - 09/08/19 1256    Subjective  Patient reports that overall he is having less back pain and stiffness.  He rates his LBP as 2/10 upon arrival today.    Pertinent History  Pt suspects a 3-4 days after working out at [employer], pt started having severe "sharp" pain at the top of his gluteal cleft. He assumed it was his back, started going to chiro, initally better, but essentially unchanged, persisted for 2x/week for about 6 weeks. Pt eventually was referred to DC, pt given a variety of  pain medications (tramadol, 'oxy', flexaril), none of which helped.  Eventually saw the physiatrist who was questioning injections and/or ablasion. Pt also saw neurology Dr. Lacinda Axon who says patient has no operable issues as his diagnostic injections down to L5/S1 were higher than symptomatic level.    Limitations  Lifting;Standing;House hold activities    Currently in Pain?  Yes    Pain Score  2     Pain Location  Back    Pain Orientation  Lower    Pain Descriptors / Indicators  Aching    Pain Type  Chronic pain    Pain Onset  More than a month ago    Pain Frequency  Constant    Multiple Pain Sites  No         Treatment:    Therapeutic Exercise:  Bilateral hip IR and ER stretches in supine 4x30 sec each; supine ITB stretches across midline and HS stretches 3x30 sec each B; supine posterior pelvic tilts with focus on TA activation.  Manual Therapy:  Lumbar flexion stretches in supine to end range 3x30 sec B; ITB stretches 3x30 sec B; Hip rotator stretches into IR and ER 3x20 sec B; PA mobs gr. 3/4 to B L/S in supine and sacral inf glides gr. 4 B in supine; effluerage and petrissage to B  lumbar paraspinals and manual M-F release techniques; SL'ing petrissage and cross friction massage with metal tool to bilateral ITB's.                      PT Education - 09/08/19 1258    Education Details  Exercise technique    Person(s) Educated  Patient    Methods  Explanation;Demonstration;Verbal cues    Comprehension  Verbalized understanding;Returned demonstration       PT Short Term Goals - 08/23/19 0854      PT SHORT TERM GOAL #1   Title  Patient will be independent in home exercise program to improve strength/mobility for better functional independence with ADLs.    Baseline  9/8: HEP compliant    Time  4    Period  Weeks    Status  Achieved      PT SHORT TERM GOAL #2   Title  After 4 weeks pt will report consistant pain from sitting to standing up to 5 minutes without  increases.    Baseline  pain increases from no pain to pain upon standing.    Time  4    Period  Weeks    Status  On-going    Target Date  09/20/19        PT Long Term Goals - 08/23/19 0855      PT LONG TERM GOAL #1   Title  Afte r8 weeks patient will improve walking tolerane to covering >1000 in 6MWT with LRAD.    Baseline  pain with limited community distance AMB 9/8: patient deferred due to pain from weekend    Time  8    Period  Weeks    Status  On-going    Target Date  09/20/19      PT LONG TERM GOAL #2   Title  After 8 weeks pt will report improved independence in self management of symptoms excluding use of medication.    Baseline  currently uses TENS, Gabapentin, or heat 9/8: uses TENS and exercises to help,    Time  8    Period  Weeks    Status  Partially Met    Target Date  09/20/19      PT LONG TERM GOAL #3   Title  After 8 weeks pt to demonstrate improved sciatic neurodynamics AEB PSLR >60 degrees bilat without subjective sciatic tension.    Baseline  45 degrees Right, 60 degrees left    Time  8    Period  Weeks    Status  Partially Met            Plan - 09/08/19 1304    Clinical Impression Statement  Patient responded well to manual joint mobilizations at L/S and sacrum B today, and to manual stretching of B LE's at today's session.  He continues to have tightness and trigger points in B ITB's, but had reduction of symptoms at end of session after manual therapy.    Personal Factors and Comorbidities  Behavior Pattern;Comorbidity 1;Past/Current Experience;Fitness;Time since onset of injury/illness/exacerbation    Examination-Activity Limitations  Bed Mobility;Squat;Stairs;Locomotion Level;Stand    Examination-Participation Restrictions  Shop;Community Activity;Yard Work;Meal Prep    Stability/Clinical Decision Making  Unstable/Unpredictable    Clinical Decision Making  Moderate    Rehab Potential  Fair    Clinical Impairments Affecting Rehab Potential   (-) HTN, reflux, bilateral TKA, diabetes, history of chronic back pain,recent move (+) good family support and understanding of medical field  PT Frequency  2x / week    PT Duration  8 weeks    PT Treatment/Interventions  ADLs/Self Care Home Management;Aquatic Therapy;Cryotherapy;Electrical Stimulation;Iontophoresis 98m/ml Dexamethasone;Moist Heat;Traction;Ultrasound;Balance training;Therapeutic exercise;Therapeutic activities;Functional mobility training;Stair training;Gait training;Neuromuscular re-education;Patient/family education;Manual techniques;Passive range of motion;Dry needling;Energy conservation;Taping    PT Next Visit Plan  Advance abdominal strengthening ex's as tolerated    Consulted and Agree with Plan of Care  Patient       Patient will benefit from skilled therapeutic intervention in order to improve the following deficits and impairments:  Abnormal gait, Decreased activity tolerance, Decreased balance, Decreased endurance, Decreased mobility, Decreased range of motion, Difficulty walking, Decreased strength, Hypomobility, Impaired flexibility, Impaired perceived functional ability, Increased muscle spasms, Postural dysfunction, Pain, Increased fascial restricitons, Improper body mechanics, Obesity  Visit Diagnosis: Chronic midline low back pain without sciatica  Muscle weakness (generalized)  Chronic bilateral low back pain without sciatica  Decreased range of motion of lumbar spine     Problem List Patient Active Problem List   Diagnosis Date Noted  . Joint swelling 11/16/2018  . Pit[place] ([place] 11/25/2017  . Essential hypertension 09/09/2017  . Type 2 diabetes mellitus ([place] 09/09/2017  . GERD (gastroesophageal reflux disease) 09/09/2017  . Testosterone deficiency 09/09/2017  . OSA (obstructive sleep apnea) 09/09/2017  . Chronic bilateral low back pain without sciatica 09/09/2017  . H/O knee surgery 04/27/2014  . [place] ([place]  04/05/2014    Amaury Kuzel, MPT 09/08/2019, 1:10 PM  [place] [facility] [address] [address]Phone: [phone]  Fax:  [phone] Name: Adam GuterrezMRN: [id number] of Birth: [birth year]

## 2019-09-13 ENCOUNTER — Ambulatory Visit: Payer: Medicare Other

## 2019-09-13 ENCOUNTER — Other Ambulatory Visit: Payer: Self-pay

## 2019-09-13 DIAGNOSIS — G8929 Other chronic pain: Secondary | ICD-10-CM

## 2019-09-13 DIAGNOSIS — M545 Low back pain, unspecified: Secondary | ICD-10-CM

## 2019-09-13 DIAGNOSIS — M6281 Muscle weakness (generalized): Secondary | ICD-10-CM

## 2019-09-13 DIAGNOSIS — M5386 Other specified dorsopathies, lumbar region: Secondary | ICD-10-CM | POA: Diagnosis not present

## 2019-09-13 NOTE — Therapy (Signed)
[place] MAIN [facility] SERVICES [address] [place], [place], [address] Phone: [phone]   Fax:  [phone]  Physical Therapy Treatment  Patient Details  Name: Adam Wise MRN: [id number] Date of Birth: 12-25-1950 No data recorded  Encounter Date: 09/13/2019  PT End of Session - 09/13/19 1112    Visit Number  15    Number of Visits  16    Date for PT Re-Evaluation  09/20/19    Authorization Type  Medicare    Authorization Time Period  5/10 Pn on 08/23/19    PT Start Time  1030    PT Stop Time  1111    PT Time Calculation (min)  41 min    Activity Tolerance  Patient tolerated treatment well;Patient limited by pain    Behavior During Therapy  [facility] for tasks assessed/performed       Past Medical History:  Diagnosis Date  . Chickenpox   . Chronic knee pain   . Chronic low back pain   . Essential hypertension   . GERD (gastroesophageal reflux disease)   . OSA (obstructive sleep apnea)   . Testosterone deficiency   . Type 2 diabetes mellitus ([place])     Past Surgical History:  Procedure Laterality Date  . COLONOSCOPY WITH PROPOFOL N/A 12/17/2018   Procedure: COLONOSCOPY WITH PROPOFOL;  Surgeon: Virgel Manifold, MD;  Location: [facility] ENDOSCOPY;  Service: Gastroenterology;  Laterality: N/A;  . JOINT REPLACEMENT Bilateral   . REPLACEMENT TOTAL KNEE BILATERAL  2015  . TONSILLECTOMY  1960    There were no vitals filed for this visit.  Subjective Assessment - 09/13/19 1111    Subjective  Patient reports his pain was doing well until his last session with the therapist covering main therapist. Is leaving for his motorcycle trip on the 1st.    Pertinent History  Pt suspects a 3-4 days after working out at [employer], pt started having severe "sharp" pain at the top of his gluteal cleft. He assumed it was his back, started going to chiro, initally better, but essentially unchanged, persisted for 2x/week for about 6 weeks. Pt eventually was  referred to DC, pt given a variety of pain medications (tramadol, 'oxy', flexaril), none of which helped.  Eventually saw the physiatrist who was questioning injections and/or ablasion. Pt also saw neurology Dr. Lacinda Axon who says patient has no operable issues as his diagnostic injections down to L5/S1 were higher than symptomatic level.    Limitations  Lifting;Standing;House hold activities    Currently in Pain?  Yes    Pain Score  5     Pain Location  Back    Pain Orientation  Lower    Pain Descriptors / Indicators  Aching    Pain Type  Chronic pain    Pain Onset  More than a month ago    Pain Frequency  Constant         Manual Prone Mobilizations CPA, UPA L1-sacrum grade II-III, noted hypomobility 5x10 seconds each LE; mid and upper thoracic J sweep pressure for postural alignment x 20 seconds x6 trials Sacral inferior mobilizations, central and lateral 6x 15 second pulses of grade II mobilizations STM to quadratus, lower lat, and lumbar paraspinals x 16 minutes with noted muscle tissue limitations with tenderness and trigger points noted. Implemented effleurage and ptrissage techniques along with use of metal tool  for reduction of muscle tissue tension and pain relief.  Roller to calves, piriformis musculature 3 minutes  Supine:     IT band stretch 60 second holds , rollout with roller 60 seconds  IT band STM cross friction and along the tissue line with use of metal tool x 5 minutes, patient reporting great relief of symptoms.    Ther Ex: Patient guided in maintaining alignment and muscle activation for pain reduction and proper muscle activation.   Supine posterior pelvic tilt with adduction ball squeeze 10x 3 second holds seated posterior pelvic tilt 15x ; cueing for foot placement for optimal muscle recruitment/body mechanics Ambulate in gym s/p therex and manual with improved heel strike, step length, and arm swing.  Sit to stands: Seated posterior pelvic tilt with sit to stand x  10, decreased hinging with repetitions GTB row seated with posterior pelvic tilt and TrA activaiton    Education on HEP compliance on trip   Pt educated throughout session about proper posture and technique with exercises. Improved exercise technique, movement at target joints, use of target muscles after min to mod verbal, visual, tactile cues.                         PT Education - 09/13/19 1112    Education Details  exercise technique, manual, HEP during trip    Person(s) Educated  Patient    Methods  Explanation;Demonstration;Tactile cues;Verbal cues    Comprehension  Verbalized understanding;Returned demonstration;Verbal cues required;Tactile cues required       PT Short Term Goals - 08/23/19 0854      PT SHORT TERM GOAL #1   Title  Patient will be independent in home exercise program to improve strength/mobility for better functional independence with ADLs.    Baseline  9/8: HEP compliant    Time  4    Period  Weeks    Status  Achieved      PT SHORT TERM GOAL #2   Title  After 4 weeks pt will report consistant pain from sitting to standing up to 5 minutes without increases.    Baseline  pain increases from no pain to pain upon standing.    Time  4    Period  Weeks    Status  On-going    Target Date  09/20/19        PT Long Term Goals - 08/23/19 0855      PT LONG TERM GOAL #1   Title  Afte r8 weeks patient will improve walking tolerane to covering >1000 in 6MWT with LRAD.    Baseline  pain with limited community distance AMB 9/8: patient deferred due to pain from weekend    Time  8    Period  Weeks    Status  On-going    Target Date  09/20/19      PT LONG TERM GOAL #2   Title  After 8 weeks pt will report improved independence in self management of symptoms excluding use of medication.    Baseline  currently uses TENS, Gabapentin, or heat 9/8: uses TENS and exercises to help,    Time  8    Period  Weeks    Status  Partially Met    Target  Date  09/20/19      PT LONG TERM GOAL #3   Title  After 8 weeks pt to demonstrate improved sciatic neurodynamics AEB PSLR >60 degrees bilat without subjective sciatic tension.    Baseline  45 degrees Right, 60 degrees left    Time  8  Period  Weeks    Status  Partially Met            Plan - 09/13/19 1113    Clinical Impression Statement  Patient presented with higher pain than normal to today's physical therapy session. Intensive manual and therex reduced pain to 1/10. Patient educated on HEP for trip and verbalized understanding. Has noticeable trigger points/muscle knots in bilateral paraspinal and quadratus musculature. Extensive work on IT band resulted in reduction of symptoms and improved step length with proper heel strike. Patient will benefit from skilled physical therapy to improve posture, functional capacity for mobility, and pain reduction for improved quality of life    Personal Factors and Comorbidities  Behavior Pattern;Comorbidity 1;Past/Current Experience;Fitness;Time since onset of injury/illness/exacerbation    Examination-Activity Limitations  Bed Mobility;Squat;Stairs;Locomotion Level;Stand    Examination-Participation Restrictions  Shop;Community Activity;Yard Work;Meal Prep    Stability/Clinical Decision Making  Unstable/Unpredictable    Rehab Potential  Fair    Clinical Impairments Affecting Rehab Potential  (-) HTN, reflux, bilateral TKA, diabetes, history of chronic back pain,recent move (+) good family support and understanding of medical field    PT Frequency  2x / week    PT Duration  8 weeks    PT Treatment/Interventions  ADLs/Self Care Home Management;Aquatic Therapy;Cryotherapy;Electrical Stimulation;Iontophoresis '4mg'$ /ml Dexamethasone;Moist Heat;Traction;Ultrasound;Balance training;Therapeutic exercise;Therapeutic activities;Functional mobility training;Stair training;Gait training;Neuromuscular re-education;Patient/family education;Manual  techniques;Passive range of motion;Dry needling;Energy conservation;Taping    PT Next Visit Plan  Advance abdominal strengthening ex's as tolerated    Consulted and Agree with Plan of Care  Patient       Patient will benefit from skilled therapeutic intervention in order to improve the following deficits and impairments:  Abnormal gait, Decreased activity tolerance, Decreased balance, Decreased endurance, Decreased mobility, Decreased range of motion, Difficulty walking, Decreased strength, Hypomobility, Impaired flexibility, Impaired perceived functional ability, Increased muscle spasms, Postural dysfunction, Pain, Increased fascial restricitons, Improper body mechanics, Obesity  Visit Diagnosis: Chronic midline low back pain without sciatica  Muscle weakness (generalized)     Problem List Patient Active Problem List   Diagnosis Date Noted  . Joint swelling 11/16/2018  . 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 Essential hypertension 09/09/2017  . Type 2 diabetes mellitus ([place]) 09/09/2017  . GERD (gastroesophageal reflux disease) 09/09/2017  . Testosterone deficiency 09/09/2017  . OSA (obstructive sleep apnea) 09/09/2017  . Chronic bilateral low back pain without sciatica 09/09/2017  . H/O knee surgery 04/27/2014  . 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

## 2019-09-15 ENCOUNTER — Ambulatory Visit: Payer: Medicare Other

## 2019-09-21 ENCOUNTER — Other Ambulatory Visit: Payer: Self-pay

## 2019-09-21 ENCOUNTER — Ambulatory Visit: Payer: Medicare Other | Attending: Family Medicine

## 2019-09-21 DIAGNOSIS — M545 Low back pain, unspecified: Secondary | ICD-10-CM

## 2019-09-21 DIAGNOSIS — G8929 Other chronic pain: Secondary | ICD-10-CM | POA: Insufficient documentation

## 2019-09-21 DIAGNOSIS — M6281 Muscle weakness (generalized): Secondary | ICD-10-CM

## 2019-09-21 NOTE — Therapy (Signed)
[place] MAIN [facility] SERVICES [address] [place], [place], [address] Phone: [phone]   Fax:  [phone]  Physical Therapy Treatment/ RECERT  Patient Details  Name: Adam Wise MRN: [id number] Date of Birth: 1951-08-05 No data recorded  Encounter Date: 09/21/2019  PT End of Session - 09/21/19 1747    Visit Number  16    Number of Visits  24    Date for PT Re-Evaluation  10/19/19    Authorization Type  Medicare    Authorization Time Period  6/10 Pn on 08/23/19    PT Start Time  1350    PT Stop Time  1430    PT Time Calculation (min)  40 min    Activity Tolerance  Patient tolerated treatment well    Behavior During Therapy  [facility] for tasks assessed/performed       Past Medical History:  Diagnosis Date  . Chickenpox   . Chronic knee pain   . Chronic low back pain   . Essential hypertension   . GERD (gastroesophageal reflux disease)   . OSA (obstructive sleep apnea)   . Testosterone deficiency   . Type 2 diabetes mellitus ([place])     Past Surgical History:  Procedure Laterality Date  . COLONOSCOPY WITH PROPOFOL N/A 12/17/2018   Procedure: COLONOSCOPY WITH PROPOFOL;  Surgeon: Virgel Manifold, MD;  Location: [facility] ENDOSCOPY;  Service: Gastroenterology;  Laterality: N/A;  . JOINT REPLACEMENT Bilateral   . REPLACEMENT TOTAL KNEE BILATERAL  2015  . TONSILLECTOMY  1960    There were no vitals filed for this visit.  Subjective Assessment - 09/21/19 1746    Subjective  Patient has returned from his trip. He tolerated the first day however had noted pain and stiffness following it, performance of HEP allowed symptoms to be manageable throughout the trip but did have some episods of flare ups indicating progress not but completion of return to PLOF.    Pertinent History  Pt suspects a 3-4 days after working out at [employer], pt started having severe "sharp" pain at the top of his gluteal cleft. He assumed it was his back, started going to  chiro, initally better, but essentially unchanged, persisted for 2x/week for about 6 weeks. Pt eventually was referred to DC, pt given a variety of pain medications (tramadol, 'oxy', flexaril), none of which helped.  Eventually saw the physiatrist who was questioning injections and/or ablasion. Pt also saw neurology Dr. Lacinda Axon who says patient has no operable issues as his diagnostic injections down to L5/S1 were higher than symptomatic level.    Limitations  Lifting;Standing;House hold activities    Currently in Pain?  Yes    Pain Score  4     Pain Location  Back    Pain Orientation  Lower    Pain Descriptors / Indicators  Aching    Pain Type  Chronic pain    Pain Onset  More than a month ago    Pain Frequency  Constant    Aggravating Factors   walking    Pain Relieving Factors  rest, HEP    Effect of Pain on Daily Activities  limits standing and walking tolerance       6 minute walk test: 1000 ft 5 minutes 3 seconds  Independence with self management of symptoms     Manual Prone Mobilizations CPA, UPA L1-sacrum grade II-III, noted hypomobility 5x10 seconds each LE; mid and upper thoracic J sweep pressure for postural alignment x 20  seconds x6 trials Sacral inferior mobilizations, central and lateral 6x 15 second pulses of grade II mobilizations STM to quadratus, lower lat, and lumbar paraspinals x 11 minutes with noted muscle tissue limitations with tenderness and trigger points noted. Implemented effleurage and ptrissage techniques along with use of metal tool  for reduction of muscle tissue tension and pain relief.     Supine:   IT band STM cross friction and along the tissue line with use of metal tool x 5 minutes, patient reporting great relief of symptoms.      Pt educated throughout session about proper posture and technique with exercises. Improved exercise technique, movement at target joints, use of target muscles after min to mod verbal, visual, tactile cues.                            PT Education - 09/21/19 1747    Education Details  goals, POC, exercise techniqu/manual    Person(s) Educated  Patient    Methods  Explanation;Demonstration;Tactile cues;Verbal cues    Comprehension  Verbalized understanding;Returned demonstration;Verbal cues required;Tactile cues required       PT Short Term Goals - 09/21/19 1749      PT SHORT TERM GOAL #1   Title  Patient will be independent in home exercise program to improve strength/mobility for better functional independence with ADLs.    Baseline  9/8: HEP compliant    Time  4    Period  Weeks    Status  Achieved      PT SHORT TERM GOAL #2   Title  After 4 weeks pt will report consistant pain from sitting to standing up to 5 minutes without increases.    Baseline  pain increases from no pain to pain upon standing 10/7: increased by 2  points    Time  2    Period  Weeks    Status  Partially Met    Target Date  10/05/19        PT Long Term Goals - 09/21/19 1751      PT LONG TERM GOAL #1   Title  Afte r8 weeks patient will improve walking tolerane to covering >1000 in 6MWT with LRAD.    Baseline  pain with limited community distance AMB 9/8: patient deferred due to pain from weekend 10/7: >1000 ft    Time  8    Period  Weeks    Status  Achieved      PT LONG TERM GOAL #2   Title  After 8 weeks pt will report improved independence in self management of symptoms excluding use of medication.    Baseline  currently uses TENS, Gabapentin, or heat 9/8: uses TENS and exercises to help, 10/7: use of HEP assists in management, still not fully under control on "bad days"    Time  4    Period  Weeks    Status  Partially Met    Target Date  10/19/19      PT LONG TERM GOAL #3   Title  After 8 weeks pt to demonstrate improved sciatic neurodynamics AEB PSLR >60 degrees bilat without subjective sciatic tension.    Baseline  45 degrees Right, 60 degrees left 10/7: able to perform     Time  8    Period  Weeks    Status  Achieved      PT LONG TERM GOAL #4   Title  Patient will experience 1  day or less of "high pain levels" indicating improved symptom frequency for quality of life and ability to perform functional tasks with limited pain increase    Baseline  10/7: 3 days of high pain    Time  4    Period  Weeks    Status  New    Target Date  10/19/19      PT LONG TERM GOAL #5   Title  Patient will reduce modified Oswestry score to <20 as to demonstrate minimal disability with ADLs including improved sleeping tolerance, walking/sitting tolerance etc for better mobility with ADLs.    Baseline  10/7: 41%    Time  4    Period  Weeks    Status  New    Target Date  10/19/19            Plan - 09/21/19 1749    Clinical Impression Statement  Patient returning after vacation. Was able to tolerate some riding however did have noted pain after prolonged sitting the first day of trip. Has been compliant with his HEP over the trip and has noted improvements/progressions towards goals. Due to progression with patient not yet at full potential will decrease cert time from 8 weeks to 4 weeks due to functional demand.  Patient is having decreased level of pain with decreased episodes of higher pain. Patient requires prolonged gentle mobilizations and release for reduction of symptoms and decreased pain. Patient reports pain reduction by end of session with improved biomechanics and posture.Patient's condition has the potential to improve in response to therapy. Maximum improvement is yet to be obtained. The anticipated improvement is attainable and reasonable in a generally predictable time. Patient will benefit from skilled physical therapy to improve posture, functional capacity for mobility, and pain reduction for improved quality of life    Personal Factors and Comorbidities  Behavior Pattern;Comorbidity 1;Past/Current Experience;Fitness;Time since onset of  injury/illness/exacerbation    Examination-Activity Limitations  Bed Mobility;Squat;Stairs;Locomotion Level;Stand    Examination-Participation Restrictions  Shop;Community Activity;Yard Work;Meal Prep    Stability/Clinical Decision Making  Unstable/Unpredictable    Rehab Potential  Fair    Clinical Impairments Affecting Rehab Potential  (-) HTN, reflux, bilateral TKA, diabetes, history of chronic back pain,recent move (+) good family support and understanding of medical field    PT Frequency  2x / week    PT Duration  4 weeks    PT Treatment/Interventions  ADLs/Self Care Home Management;Aquatic Therapy;Cryotherapy;Electrical Stimulation;Iontophoresis 39m/ml Dexamethasone;Moist Heat;Traction;Ultrasound;Balance training;Therapeutic exercise;Therapeutic activities;Functional mobility training;Stair training;Gait training;Neuromuscular re-education;Patient/family education;Manual techniques;Passive range of motion;Dry needling;Energy conservation;Taping    PT Next Visit Plan  Advance abdominal strengthening ex's as tolerated    Consulted and Agree with Plan of Care  Patient       Patient will benefit from skilled therapeutic intervention in order to improve the following deficits and impairments:  Abnormal gait, Decreased activity tolerance, Decreased balance, Decreased endurance, Decreased mobility, Decreased range of motion, Difficulty walking, Decreased strength, Hypomobility, Impaired flexibility, Impaired perceived functional ability, Increased muscle spasms, Postural dysfunction, Pain, Increased fascial restricitons, Improper body mechanics, Obesity  Visit Diagnosis: Chronic midline low back pain without sciatica  Muscle weakness (generalized)     Problem List Patient Active Problem List   Diagnosis Date Noted  . Joint swelling 11/16/2018  . 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 Essential hypertension 09/09/2017  . Type 2 diabetes mellitus ([place] 09/09/2017  . GERD (gastroesophageal  reflux disease) 09/09/2017  . Testosterone deficiency 09/09/2017  . OSA (obstructive sleep apnea) 09/09/2017  .  Chronic bilateral low back pain without sciatica 09/09/2017  . H/O knee surgery 04/27/2014  . 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 [place], [place], [address] Phone: [phone]   Fax:  [phone]  Name: Adam Wise MRN: [id number] Date of Birth: 05/06/1951

## 2019-09-23 ENCOUNTER — Other Ambulatory Visit: Payer: Self-pay

## 2019-09-23 ENCOUNTER — Ambulatory Visit: Payer: Medicare Other

## 2019-09-23 DIAGNOSIS — G8929 Other chronic pain: Secondary | ICD-10-CM | POA: Diagnosis not present

## 2019-09-23 DIAGNOSIS — M545 Low back pain, unspecified: Secondary | ICD-10-CM

## 2019-09-23 DIAGNOSIS — M6281 Muscle weakness (generalized): Secondary | ICD-10-CM | POA: Diagnosis not present

## 2019-09-23 NOTE — Therapy (Signed)
[place] MAIN [facility] SERVICES [address] [place], [place], [address] Phone: [phone]   Fax:  [phone]  Physical Therapy Treatment  Patient Details  Name: Adam Wise MRN: [id number] Date of Birth: 1951/05/05 No data recorded  Encounter Date: 09/23/2019  PT End of Session - 09/23/19 1023    Visit Number  17    Number of Visits  24    Date for PT Re-Evaluation  10/19/19    Authorization Type  Medicare    Authorization Time Period  7/10 Pn on 08/23/19    PT Start Time  0954    PT Stop Time  1036    PT Time Calculation (min)  42 min    Activity Tolerance  Patient tolerated treatment well    Behavior During Therapy  [facility] for tasks assessed/performed       Past Medical History:  Diagnosis Date  . Chickenpox   . Chronic knee pain   . Chronic low back pain   . Essential hypertension   . GERD (gastroesophageal reflux disease)   . OSA (obstructive sleep apnea)   . Testosterone deficiency   . Type 2 diabetes mellitus ([place])     Past Surgical History:  Procedure Laterality Date  . COLONOSCOPY WITH PROPOFOL N/A 12/17/2018   Procedure: COLONOSCOPY WITH PROPOFOL;  Surgeon: Virgel Manifold, MD;  Location: [facility] ENDOSCOPY;  Service: Gastroenterology;  Laterality: N/A;  . JOINT REPLACEMENT Bilateral   . REPLACEMENT TOTAL KNEE BILATERAL  2015  . TONSILLECTOMY  1960    There were no vitals filed for this visit.  Subjective Assessment - 09/23/19 0954    Subjective  Patient reports yesterday was a great day, woke up this morning with pain and stiffness. has been compliant with HEP.    Pertinent History  Pt suspects a 3-4 days after working out at [employer], pt started having severe "sharp" pain at the top of his gluteal cleft. He assumed it was his back, started going to chiro, initally better, but essentially unchanged, persisted for 2x/week for about 6 weeks. Pt eventually was referred to DC, pt given a variety of pain medications  (tramadol, 'oxy', flexaril), none of which helped.  Eventually saw the physiatrist who was questioning injections and/or ablasion. Pt also saw neurology Dr. Lacinda Axon who says patient has no operable issues as his diagnostic injections down to L5/S1 were higher than symptomatic level.    Limitations  Lifting;Standing;House hold activities    Currently in Pain?  Yes    Pain Score  3     Pain Location  Back    Pain Orientation  Lower    Pain Descriptors / Indicators  Aching    Pain Type  Chronic pain    Pain Onset  More than a month ago    Pain Frequency  Constant             Manual Prone Mobilizations CPA, UPA L1-sacrum grade II-III, noted hypomobility 5x10 seconds each LE; mid and upper thoracic J sweep pressure for postural alignment x 20 seconds x6 trials Sacral inferior mobilizations, central and lateral 6x 15 second pulses of grade II mobilizations STM to quadratus, lower lat, and lumbar paraspinals x 16 minutes with noted muscle tissue limitations with tenderness and trigger points noted. Implemented effleurage and ptrissage techniques along with use of metal tool  for reduction of muscle tissue tension and pain relief.  Roller to calves, piriformis musculature 3 minutes   Supine:  IT band stretch 60 second holds , rollout with roller 60 seconds  IT band STM cross friction and along the tissue line with use of metal tool x 5 minutes, patient reporting great relief of symptoms.    Ther Ex: Patient guided in maintaining alignment and muscle activation for pain reduction and proper muscle activation.   Supine posterior pelvic tilt with adduction ball squeeze 10x 3 second holds Supine posterior pelvic tilt with abduction , 10x 3 second holds seated posterior pelvic tilt 15x ; cueing for foot placement for optimal muscle recruitment/body mechanics Ambulate in gym s/p therex and manual with improved heel strike, step length, and arm swing.  Sit to stands: Seated posterior pelvic tilt  with sit to stand x 10, decreased hinging with repetitions GTB row seated with posterior pelvic tilt and TrA activaiton  GTB straight arm lat pull down 15x, educated on addition to HEP, sitting only not standing for addition of pelvic tilt with pull for optimal spinal alignment.      Pt educated throughout session about proper posture and technique with exercises. Improved exercise technique, movement at target joints, use of target muscles after min to mod verbal, visual, tactile cues.                    PT Education - 09/23/19 1023    Education Details  exercise technique, manual    Person(s) Educated  Patient    Methods  Explanation;Demonstration;Tactile cues;Verbal cues    Comprehension  Verbalized understanding;Returned demonstration;Verbal cues required;Tactile cues required       PT Short Term Goals - 09/21/19 1749      PT SHORT TERM GOAL #1   Title  Patient will be independent in home exercise program to improve strength/mobility for better functional independence with ADLs.    Baseline  9/8: HEP compliant    Time  4    Period  Weeks    Status  Achieved      PT SHORT TERM GOAL #2   Title  After 4 weeks pt will report consistant pain from sitting to standing up to 5 minutes without increases.    Baseline  pain increases from no pain to pain upon standing 10/7: increased by 2  points    Time  2    Period  Weeks    Status  Partially Met    Target Date  10/05/19        PT Long Term Goals - 09/21/19 1751      PT LONG TERM GOAL #1   Title  Afte r8 weeks patient will improve walking tolerane to covering >1000 in 6MWT with LRAD.    Baseline  pain with limited community distance AMB 9/8: patient deferred due to pain from weekend 10/7: >1000 ft    Time  8    Period  Weeks    Status  Achieved      PT LONG TERM GOAL #2   Title  After 8 weeks pt will report improved independence in self management of symptoms excluding use of medication.    Baseline   currently uses TENS, Gabapentin, or heat 9/8: uses TENS and exercises to help, 10/7: use of HEP assists in management, still not fully under control on "bad days"    Time  4    Period  Weeks    Status  Partially Met    Target Date  10/19/19      PT LONG TERM GOAL #3   Title  After 8 weeks pt to demonstrate improved sciatic neurodynamics AEB PSLR >60 degrees bilat without subjective sciatic tension.    Baseline  45 degrees Right, 60 degrees left 10/7: able to perform    Time  8    Period  Weeks    Status  Achieved      PT LONG TERM GOAL #4   Title  Patient will experience 1 day or less of "high pain levels" indicating improved symptom frequency for quality of life and ability to perform functional tasks with limited pain increase    Baseline  10/7: 3 days of high pain    Time  4    Period  Weeks    Status  New    Target Date  10/19/19      PT LONG TERM GOAL #5   Title  Patient will reduce modified Oswestry score to <20 as to demonstrate minimal disability with ADLs including improved sleeping tolerance, walking/sitting tolerance etc for better mobility with ADLs.    Baseline  10/7: 41%    Time  4    Period  Weeks    Status  New    Target Date  10/19/19            Plan - 09/23/19 1043    Clinical Impression Statement  Patient initially presents with trigger points of L and R paraspinals with R>L this session contrary to normal L>R. Patient demonstrated understanding of additional tasks for HEP for progression of posture, pain reduction, and strength. Decreased pain at end of session noted with patient no longer utilizing antalgic gait pattern at end of session. Patient will benefit from skilled physical therapy to improve posture, functional capacity for mobility, and pain reduction for improved quality of life    Personal Factors and Comorbidities  Behavior Pattern;Comorbidity 1;Past/Current Experience;Fitness;Time since onset of injury/illness/exacerbation     Examination-Activity Limitations  Bed Mobility;Squat;Stairs;Locomotion Level;Stand    Examination-Participation Restrictions  Shop;Community Activity;Yard Work;Meal Prep    Stability/Clinical Decision Making  Unstable/Unpredictable    Rehab Potential  Fair    Clinical Impairments Affecting Rehab Potential  (-) HTN, reflux, bilateral TKA, diabetes, history of chronic back pain,recent move (+) good family support and understanding of medical field    PT Frequency  2x / week    PT Duration  4 weeks    PT Treatment/Interventions  ADLs/Self Care Home Management;Aquatic Therapy;Cryotherapy;Electrical Stimulation;Iontophoresis 55m/ml Dexamethasone;Moist Heat;Traction;Ultrasound;Balance training;Therapeutic exercise;Therapeutic activities;Functional mobility training;Stair training;Gait training;Neuromuscular re-education;Patient/family education;Manual techniques;Passive range of motion;Dry needling;Energy conservation;Taping    PT Next Visit Plan  Advance abdominal strengthening ex's as tolerated    Consulted and Agree with Plan of Care  Patient       Patient will benefit from skilled therapeutic intervention in order to improve the following deficits and impairments:  Abnormal gait, Decreased activity tolerance, Decreased balance, Decreased endurance, Decreased mobility, Decreased range of motion, Difficulty walking, Decreased strength, Hypomobility, Impaired flexibility, Impaired perceived functional ability, Increased muscle spasms, Postural dysfunction, Pain, Increased fascial restricitons, Improper body mechanics, Obesity  Visit Diagnosis: Chronic midline low back pain without sciatica  Muscle weakness (generalized)     Problem List Patient Active Problem List   Diagnosis Date Noted  . Joint swelling 11/16/2018  . 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 Essential hypertension 09/09/2017  . Type 2 diabetes mellitus ([place] 09/09/2017  . GERD (gastroesophageal reflux disease) 09/09/2017  .  Testosterone deficiency 09/09/2017  . OSA (obstructive sleep apnea) 09/09/2017  . Chronic bilateral low back pain without sciatica 09/09/2017  .  H/O knee surgery 04/27/2014  . 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

## 2019-09-28 ENCOUNTER — Ambulatory Visit: Payer: Medicare Other

## 2019-09-28 ENCOUNTER — Other Ambulatory Visit: Payer: Self-pay

## 2019-09-28 DIAGNOSIS — M545 Low back pain, unspecified: Secondary | ICD-10-CM

## 2019-09-28 DIAGNOSIS — M6281 Muscle weakness (generalized): Secondary | ICD-10-CM

## 2019-09-28 DIAGNOSIS — R948 Abnormal results of function studies of other organs and systems: Secondary | ICD-10-CM | POA: Diagnosis not present

## 2019-09-28 DIAGNOSIS — E236 Other disorders of pituitary gland: Secondary | ICD-10-CM | POA: Diagnosis not present

## 2019-09-28 DIAGNOSIS — G8929 Other chronic pain: Secondary | ICD-10-CM | POA: Diagnosis not present

## 2019-09-28 DIAGNOSIS — R7989 Other specified abnormal findings of blood chemistry: Secondary | ICD-10-CM | POA: Diagnosis not present

## 2019-09-28 NOTE — Therapy (Signed)
[place] MAIN [facility] SERVICES [address] [place], [place], [address] Phone: [phone]   Fax:  [phone]  Physical Therapy Treatment  Patient Details  Name: Adam Wise MRN: [id number] Date of Birth: 1950-12-25 No data recorded  Encounter Date: 09/28/2019  PT End of Session - 09/28/19 0835    Visit Number  18    Number of Visits  24    Date for PT Re-Evaluation  10/19/19    Authorization Type  Medicare    Authorization Time Period  8/10 Pn on 08/23/19    PT Start Time  0800    PT Stop Time  0844    PT Time Calculation (min)  44 min    Activity Tolerance  Patient tolerated treatment well    Behavior During Therapy  [facility] for tasks assessed/performed       Past Medical History:  Diagnosis Date  . Chickenpox   . Chronic knee pain   . Chronic low back pain   . Essential hypertension   . GERD (gastroesophageal reflux disease)   . OSA (obstructive sleep apnea)   . Testosterone deficiency   . Type 2 diabetes mellitus ([place])     Past Surgical History:  Procedure Laterality Date  . COLONOSCOPY WITH PROPOFOL N/A 12/17/2018   Procedure: COLONOSCOPY WITH PROPOFOL;  Surgeon: Virgel Manifold, MD;  Location: [facility] ENDOSCOPY;  Service: Gastroenterology;  Laterality: N/A;  . JOINT REPLACEMENT Bilateral   . REPLACEMENT TOTAL KNEE BILATERAL  2015  . TONSILLECTOMY  1960    There were no vitals filed for this visit.  Subjective Assessment - 09/28/19 0833    Subjective  Patient reports he has been stiff due to the rainy weather but compliant with HEP. No falls or LOB.    Pertinent History  Pt suspects a 3-4 days after working out at [employer], pt started having severe "sharp" pain at the top of his gluteal cleft. He assumed it was his back, started going to chiro, initally better, but essentially unchanged, persisted for 2x/week for about 6 weeks. Pt eventually was referred to DC, pt given a variety of pain medications (tramadol, 'oxy',  flexaril), none of which helped.  Eventually saw the physiatrist who was questioning injections and/or ablasion. Pt also saw neurology Dr. Lacinda Axon who says patient has no operable issues as his diagnostic injections down to L5/S1 were higher than symptomatic level.    Limitations  Lifting;Standing;House hold activities    Currently in Pain?  Yes    Pain Score  2     Pain Location  Back    Pain Orientation  Lower    Pain Descriptors / Indicators  Aching    Pain Type  Chronic pain    Pain Onset  More than a month ago    Pain Frequency  Constant              Manual Prone Mobilizations CPA, UPA L1-sacrum grade II-III, noted hypomobility 5x10 seconds each LE; mid and upper thoracic J sweep pressure for postural alignment x 20 seconds x6 trials Sacral inferior mobilizations, central and lateral 6x 15 second pulses of grade II mobilizations STM to quadratus, lower lat, and lumbar paraspinals x 16 minutes with noted muscle tissue limitations with tenderness and trigger points noted. Implemented effleurage and ptrissage techniques along with use of metal tool  for reduction of muscle tissue tension and pain relief.  Roller to calves, piriformis musculature 3 minutes   Supine:  IT band stretch 60 second holds , rollout with roller 60 seconds  IT band STM cross friction and along the tissue line with use of metal tool x 5 minutes, patient reporting great relief of symptoms.    Ther Ex: Patient guided in maintaining alignment and muscle activation for pain reduction and proper muscle activation.   Supine posterior pelvic tilt with adduction ball squeeze 10x 3 second holds Supine posterior pelvic tilt with abduction , 10x 3 second holds seated posterior pelvic tilt 15x ; cueing for foot placement for optimal muscle recruitment/body mechanics Ambulate in gym s/p therex and manual with improved heel strike, step length, and arm swing.  Sit to stands: Seated posterior pelvic tilt with sit to  stand x 10, decreased hinging with repetitions GTB row seated with posterior pelvic tilt and TrA activaiton  GTB straight arm lat pull down 15x, educated on addition to HEP, sitting only not standing for addition of pelvic tilt with pull for optimal spinal alignment.     use of heat to low back during interventions    Pt educated throughout session about proper posture and technique with exercises. Improved exercise technique, movement at target joints, use of target muscles after min to mod verbal, visual, tactile cues.                  PT Education - 09/28/19 0835    Education Details  exercise technique, manual    Person(s) Educated  Patient    Methods  Explanation;Demonstration;Tactile cues;Verbal cues    Comprehension  Verbalized understanding;Returned demonstration;Verbal cues required;Tactile cues required       PT Short Term Goals - 09/21/19 1749      PT SHORT TERM GOAL #1   Title  Patient will be independent in home exercise program to improve strength/mobility for better functional independence with ADLs.    Baseline  9/8: HEP compliant    Time  4    Period  Weeks    Status  Achieved      PT SHORT TERM GOAL #2   Title  After 4 weeks pt will report consistant pain from sitting to standing up to 5 minutes without increases.    Baseline  pain increases from no pain to pain upon standing 10/7: increased by 2  points    Time  2    Period  Weeks    Status  Partially Met    Target Date  10/05/19        PT Long Term Goals - 09/21/19 1751      PT LONG TERM GOAL #1   Title  Afte r8 weeks patient will improve walking tolerane to covering >1000 in 6MWT with LRAD.    Baseline  pain with limited community distance AMB 9/8: patient deferred due to pain from weekend 10/7: >1000 ft    Time  8    Period  Weeks    Status  Achieved      PT LONG TERM GOAL #2   Title  After 8 weeks pt will report improved independence in self management of symptoms excluding use of  medication.    Baseline  currently uses TENS, Gabapentin, or heat 9/8: uses TENS and exercises to help, 10/7: use of HEP assists in management, still not fully under control on "bad days"    Time  4    Period  Weeks    Status  Partially Met    Target Date  10/19/19  PT LONG TERM GOAL #3   Title  After 8 weeks pt to demonstrate improved sciatic neurodynamics AEB PSLR >60 degrees bilat without subjective sciatic tension.    Baseline  45 degrees Right, 60 degrees left 10/7: able to perform    Time  8    Period  Weeks    Status  Achieved      PT LONG TERM GOAL #4   Title  Patient will experience 1 day or less of "high pain levels" indicating improved symptom frequency for quality of life and ability to perform functional tasks with limited pain increase    Baseline  10/7: 3 days of high pain    Time  4    Period  Weeks    Status  New    Target Date  10/19/19      PT LONG TERM GOAL #5   Title  Patient will reduce modified Oswestry score to <20 as to demonstrate minimal disability with ADLs including improved sleeping tolerance, walking/sitting tolerance etc for better mobility with ADLs.    Baseline  10/7: 41%    Time  4    Period  Weeks    Status  New    Target Date  10/19/19            Plan - 09/28/19 1302    Clinical Impression Statement  Patient presents with increased stiffness secondary to early time of appointment combined with weather per patient report. Patient verbalizes decreased pain and improved flexibility by end of session. Patient will benefit from skilled physical therapy to improve posture, functional capacity for mobility, and pain reduction for improved quality of life    Personal Factors and Comorbidities  Behavior Pattern;Comorbidity 1;Past/Current Experience;Fitness;Time since onset of injury/illness/exacerbation    Examination-Activity Limitations  Bed Mobility;Squat;Stairs;Locomotion Level;Stand    Examination-Participation Restrictions  Shop;Community  Activity;Yard Work;Meal Prep    Stability/Clinical Decision Making  Unstable/Unpredictable    Rehab Potential  Fair    Clinical Impairments Affecting Rehab Potential  (-) HTN, reflux, bilateral TKA, diabetes, history of chronic back pain,recent move (+) good family support and understanding of medical field    PT Frequency  2x / week    PT Duration  4 weeks    PT Treatment/Interventions  ADLs/Self Care Home Management;Aquatic Therapy;Cryotherapy;Electrical Stimulation;Iontophoresis '4mg'$ /ml Dexamethasone;Moist Heat;Traction;Ultrasound;Balance training;Therapeutic exercise;Therapeutic activities;Functional mobility training;Stair training;Gait training;Neuromuscular re-education;Patient/family education;Manual techniques;Passive range of motion;Dry needling;Energy conservation;Taping    PT Next Visit Plan  Advance abdominal strengthening ex's as tolerated    Consulted and Agree with Plan of Care  Patient       Patient will benefit from skilled therapeutic intervention in order to improve the following deficits and impairments:  Abnormal gait, Decreased activity tolerance, Decreased balance, Decreased endurance, Decreased mobility, Decreased range of motion, Difficulty walking, Decreased strength, Hypomobility, Impaired flexibility, Impaired perceived functional ability, Increased muscle spasms, Postural dysfunction, Pain, Increased fascial restricitons, Improper body mechanics, Obesity  Visit Diagnosis: Chronic midline low back pain without sciatica  Muscle weakness (generalized)     Problem List Patient Active Problem List   Diagnosis Date Noted  . Joint swelling 11/16/2018  . 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 Essential hypertension 09/09/2017  . Type 2 diabetes mellitus ([place]) 09/09/2017  . GERD (gastroesophageal reflux disease) 09/09/2017  . Testosterone deficiency 09/09/2017  . OSA (obstructive sleep apnea) 09/09/2017  . Chronic bilateral low back pain without sciatica 09/09/2017  .  H/O knee surgery 04/27/2014  . 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 [place], [place], [address] Phone: [phone]   Fax:  [phone]  Name: Brighton Pilley MRN: [id number] Date of Birth: September 28, 1951

## 2019-09-30 ENCOUNTER — Other Ambulatory Visit: Payer: Self-pay

## 2019-09-30 ENCOUNTER — Ambulatory Visit: Payer: Medicare Other

## 2019-09-30 ENCOUNTER — Other Ambulatory Visit: Payer: Self-pay | Admitting: Primary Care

## 2019-09-30 DIAGNOSIS — G8929 Other chronic pain: Secondary | ICD-10-CM | POA: Diagnosis not present

## 2019-09-30 DIAGNOSIS — M6281 Muscle weakness (generalized): Secondary | ICD-10-CM | POA: Diagnosis not present

## 2019-09-30 DIAGNOSIS — M545 Low back pain, unspecified: Secondary | ICD-10-CM

## 2019-09-30 DIAGNOSIS — I10 Essential (primary) hypertension: Secondary | ICD-10-CM

## 2019-09-30 DIAGNOSIS — E119 Type 2 diabetes mellitus without complications: Secondary | ICD-10-CM

## 2019-09-30 NOTE — Therapy (Signed)
[place] MAIN [facility] SERVICES [address] [place], [place], [address] Phone: [phone]   Fax:  [phone]  Physical Therapy Treatment  Patient Details  Name: Adam Wise MRN: [id number] Date of Birth: 1951/02/03 No data recorded  Encounter Date: 09/30/2019  PT End of Session - 09/30/19 1036    Visit Number  19    Number of Visits  24    Date for PT Re-Evaluation  10/19/19    Authorization Type  Medicare    Authorization Time Period  9/10 Pn on 08/23/19    PT Start Time  0801    PT Stop Time  0845    PT Time Calculation (min)  44 min    Activity Tolerance  Patient tolerated treatment well    Behavior During Therapy  [facility] for tasks assessed/performed       Past Medical History:  Diagnosis Date  . Chickenpox   . Chronic knee pain   . Chronic low back pain   . Essential hypertension   . GERD (gastroesophageal reflux disease)   . OSA (obstructive sleep apnea)   . Testosterone deficiency   . Type 2 diabetes mellitus ([place])     Past Surgical History:  Procedure Laterality Date  . COLONOSCOPY WITH PROPOFOL N/A 12/17/2018   Procedure: COLONOSCOPY WITH PROPOFOL;  Surgeon: Virgel Manifold, MD;  Location: [facility] ENDOSCOPY;  Service: Gastroenterology;  Laterality: N/A;  . JOINT REPLACEMENT Bilateral   . REPLACEMENT TOTAL KNEE BILATERAL  2015  . TONSILLECTOMY  1960    There were no vitals filed for this visit.  Subjective Assessment - 09/30/19 0837    Subjective  Patient reports he was taking his socks off last night when he felt a pop and pain relief. Has been compliant with HEP.    Pertinent History  Pt suspects a 3-4 days after working out at [employer], pt started having severe "sharp" pain at the top of his gluteal cleft. He assumed it was his back, started going to chiro, initally better, but essentially unchanged, persisted for 2x/week for about 6 weeks. Pt eventually was referred to DC, pt given a variety of pain medications  (tramadol, 'oxy', flexaril), none of which helped.  Eventually saw the physiatrist who was questioning injections and/or ablasion. Pt also saw neurology Dr. Lacinda Axon who says patient has no operable issues as his diagnostic injections down to L5/S1 were higher than symptomatic level.    Limitations  Lifting;Standing;House hold activities    Currently in Pain?  Yes    Pain Score  1     Pain Location  Back    Pain Orientation  Lower    Pain Descriptors / Indicators  Aching    Pain Type  Chronic pain    Pain Onset  More than a month ago    Pain Frequency  Constant           Manual Prone Mobilizations CPA, UPA L1-sacrum grade II-III, noted hypomobility 5x10 seconds each LE; mid and upper thoracic J sweep pressure for postural alignment x 20 seconds x6 trials Sacral inferior mobilizations, central and lateral 6x 15 second pulses of grade II mobilizations STM to quadratus, lower lat, and lumbar paraspinals x 16 minutes with noted muscle tissue limitations with tenderness and trigger points noted. Implemented effleurage and ptrissage techniques along with use of metal tool  for reduction of muscle tissue tension and pain relief.     Supine:     IT band stretch 60 second  holds , rollout with roller 60 seconds  IT band STM cross friction and along the tissue line with use of metal tool x 5 minutes, patient reporting great relief of symptoms.    Ther Ex: Patient guided in maintaining alignment and muscle activation for pain reduction and proper muscle activation.   Supine posterior pelvic tilt with adduction ball squeeze 10x 3 second holds Supine posterior pelvic tilt with abduction , 10x 3 second holds seated posterior pelvic tilt 15x ; cueing for foot placement for optimal muscle recruitment/body mechanics TrA activation squeezing swiss ball 10x 3 second holds  Ambulate in gym s/p therex and manual with improved heel strike, step length, and arm swing.  Supine ER RTB 15x  Sit to stands:  Seated posterior pelvic tilt with sit to stand x 10, decreased hinging with repetitions Swiss ball seated TrA activation 12 x 5 second holds  HEP, sitting only not standing for addition of pelvic tilt with pull for optimal spinal alignment.     use of heat to low back during interventions    Pt educated throughout session about proper posture and technique with exercises. Improved exercise technique, movement at target joints, use of target muscles after min to mod verbal, visual, tactile cues.                      PT Education - 09/30/19 1035    Education Details  exercise technique, manual    Person(s) Educated  Patient    Methods  Explanation;Tactile cues;Demonstration;Verbal cues    Comprehension  Verbalized understanding;Returned demonstration;Verbal cues required;Tactile cues required       PT Short Term Goals - 09/21/19 1749      PT SHORT TERM GOAL #1   Title  Patient will be independent in home exercise program to improve strength/mobility for better functional independence with ADLs.    Baseline  9/8: HEP compliant    Time  4    Period  Weeks    Status  Achieved      PT SHORT TERM GOAL #2   Title  After 4 weeks pt will report consistant pain from sitting to standing up to 5 minutes without increases.    Baseline  pain increases from no pain to pain upon standing 10/7: increased by 2  points    Time  2    Period  Weeks    Status  Partially Met    Target Date  10/05/19        PT Long Term Goals - 09/21/19 1751      PT LONG TERM GOAL #1   Title  Afte r8 weeks patient will improve walking tolerane to covering >1000 in 6MWT with LRAD.    Baseline  pain with limited community distance AMB 9/8: patient deferred due to pain from weekend 10/7: >1000 ft    Time  8    Period  Weeks    Status  Achieved      PT LONG TERM GOAL #2   Title  After 8 weeks pt will report improved independence in self management of symptoms excluding use of medication.     Baseline  currently uses TENS, Gabapentin, or heat 9/8: uses TENS and exercises to help, 10/7: use of HEP assists in management, still not fully under control on "bad days"    Time  4    Period  Weeks    Status  Partially Met    Target Date  10/19/19  PT LONG TERM GOAL #3   Title  After 8 weeks pt to demonstrate improved sciatic neurodynamics AEB PSLR >60 degrees bilat without subjective sciatic tension.    Baseline  45 degrees Right, 60 degrees left 10/7: able to perform    Time  8    Period  Weeks    Status  Achieved      PT LONG TERM GOAL #4   Title  Patient will experience 1 day or less of "high pain levels" indicating improved symptom frequency for quality of life and ability to perform functional tasks with limited pain increase    Baseline  10/7: 3 days of high pain    Time  4    Period  Weeks    Status  New    Target Date  10/19/19      PT LONG TERM GOAL #5   Title  Patient will reduce modified Oswestry score to <20 as to demonstrate minimal disability with ADLs including improved sleeping tolerance, walking/sitting tolerance etc for better mobility with ADLs.    Baseline  10/7: 41%    Time  4    Period  Weeks    Status  New    Target Date  10/19/19            Plan - 09/30/19 1045    Clinical Impression Statement  Patient presents with good motivation throughout session. Stability and core interventions progressing to more seated position from supine for carryover to daily tasks. Patient reports decreased pain by end of session with improved step length and mobility. Patient will benefit from skilled physical therapy to improve posture, functional capacity for mobility, and pain reduction for improved quality of life    Personal Factors and Comorbidities  Behavior Pattern;Comorbidity 1;Past/Current Experience;Fitness;Time since onset of injury/illness/exacerbation    Examination-Activity Limitations  Bed Mobility;Squat;Stairs;Locomotion Level;Stand     Examination-Participation Restrictions  Shop;Community Activity;Yard Work;Meal Prep    Stability/Clinical Decision Making  Unstable/Unpredictable    Rehab Potential  Fair    Clinical Impairments Affecting Rehab Potential  (-) HTN, reflux, bilateral TKA, diabetes, history of chronic back pain,recent move (+) good family support and understanding of medical field    PT Frequency  2x / week    PT Duration  4 weeks    PT Treatment/Interventions  ADLs/Self Care Home Management;Aquatic Therapy;Cryotherapy;Electrical Stimulation;Iontophoresis 63m/ml Dexamethasone;Moist Heat;Traction;Ultrasound;Balance training;Therapeutic exercise;Therapeutic activities;Functional mobility training;Stair training;Gait training;Neuromuscular re-education;Patient/family education;Manual techniques;Passive range of motion;Dry needling;Energy conservation;Taping    PT Next Visit Plan  Advance abdominal strengthening ex's as tolerated    Consulted and Agree with Plan of Care  Patient       Patient will benefit from skilled therapeutic intervention in order to improve the following deficits and impairments:  Abnormal gait, Decreased activity tolerance, Decreased balance, Decreased endurance, Decreased mobility, Decreased range of motion, Difficulty walking, Decreased strength, Hypomobility, Impaired flexibility, Impaired perceived functional ability, Increased muscle spasms, Postural dysfunction, Pain, Increased fascial restricitons, Improper body mechanics, Obesity  Visit Diagnosis: Chronic midline low back pain without sciatica  Muscle weakness (generalized)     Problem List Patient Active Problem List   Diagnosis Date Noted  . Joint swelling 11/16/2018  . Pituitary cyst ([place] 11/25/2017  . Essential hypertension 09/09/2017  . [place] 2 diabetes [place] ([place] 09/09/2017  . GERD (gastroesophageal reflux disease) 09/09/2017  . Testosterone deficiency 09/09/2017  . OSA (obstructive sleep apnea) 09/09/2017  . Chronic  bilateral low back pain without sciatica 09/09/2017  . H/O knee surgery 04/27/2014  .  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

## 2019-10-03 ENCOUNTER — Ambulatory Visit: Payer: Medicare Other

## 2019-10-03 ENCOUNTER — Other Ambulatory Visit: Payer: Self-pay

## 2019-10-03 DIAGNOSIS — G8929 Other chronic pain: Secondary | ICD-10-CM

## 2019-10-03 DIAGNOSIS — M545 Low back pain, unspecified: Secondary | ICD-10-CM

## 2019-10-03 DIAGNOSIS — M6281 Muscle weakness (generalized): Secondary | ICD-10-CM | POA: Diagnosis not present

## 2019-10-03 NOTE — Therapy (Signed)
[place] MAIN [facility] SERVICES [address] [place], [place], [address] Phone: [phone]   Fax:  [phone]  Physical Therapy Treatment Physical Therapy Progress Note   Dates of reporting period  08/23/19   to   10/03/19  Patient Details  Name: Adam Wise MRN: [id number] Date of Birth: 10-Nov-1951 No data recorded  Encounter Date: 10/03/2019  PT End of Session - 10/03/19 1252    Visit Number  20    Number of Visits  24    Date for PT Re-Evaluation  10/19/19    Authorization Type  Medicare    Authorization Time Period  10/10 Pn on 08/23/19; next note 1/10 PN on 10/19    PT Start Time  0846    PT Stop Time  0930    PT Time Calculation (min)  44 min    Activity Tolerance  Patient tolerated treatment well    Behavior During Therapy  [facility] for tasks assessed/performed       Past Medical History:  Diagnosis Date  . Chickenpox   . Chronic knee pain   . Chronic low back pain   . Essential hypertension   . GERD (gastroesophageal reflux disease)   . OSA (obstructive sleep apnea)   . Testosterone deficiency   . Type 2 diabetes mellitus ([place])     Past Surgical History:  Procedure Laterality Date  . COLONOSCOPY WITH PROPOFOL N/A 12/17/2018   Procedure: COLONOSCOPY WITH PROPOFOL;  Surgeon: Virgel Manifold, MD;  Location: [facility] ENDOSCOPY;  Service: Gastroenterology;  Laterality: N/A;  . JOINT REPLACEMENT Bilateral   . REPLACEMENT TOTAL KNEE BILATERAL  2015  . TONSILLECTOMY  1960    There were no vitals filed for this visit.  Subjective Assessment - 10/03/19 1250    Subjective  Patient left therapy Friday feeling ok, Saturday was a bad day, Sunday was not so bad, today is feeling stiff. Left side pulling more today .    Pertinent History  Pt suspects a 3-4 days after working out at [employer], pt started having severe "sharp" pain at the top of his gluteal cleft. He assumed it was his back, started going to chiro, initally better, but  essentially unchanged, persisted for 2x/week for about 6 weeks. Pt eventually was referred to DC, pt given a variety of pain medications (tramadol, 'oxy', flexaril), none of which helped.  Eventually saw the physiatrist who was questioning injections and/or ablasion. Pt also saw neurology Dr. Lacinda Axon who says patient has no operable issues as his diagnostic injections down to L5/S1 were higher than symptomatic level.    Limitations  Lifting;Standing;House hold activities    Currently in Pain?  Yes    Pain Score  3     Pain Location  Back    Pain Orientation  Lower;Left    Pain Descriptors / Indicators  Aching    Pain Type  Chronic pain    Pain Onset  More than a month ago    Pain Frequency  Constant    Aggravating Factors   walking/standing    Pain Relieving Factors  rest HEP               Manual Prone Mobilizations CPA, UPA L1-sacrum grade II-III, noted hypomobility 5x10 seconds each LE; mid and upper thoracic J sweep pressure for postural alignment x 20 seconds x6 trials Sacral inferior mobilizations, central and lateral 6x 15 second pulses of grade II mobilizations STM to quadratus, lower lat, and lumbar paraspinals x  11 minutes with noted muscle tissue limitations with tenderness and trigger points noted. Implemented effleurage and ptrissage techniques along with use of metal tool  for reduction of muscle tissue tension and pain relief.    Supine:     IT band stretch 60 second holds , rollout with roller 60 seconds  IT band STM cross friction and along the tissue line with use of metal tool x 5 minutes, patient reporting great relief of symptoms.    Ther Ex: Patient guided in maintaining alignment and muscle activation for pain reduction and proper muscle activation.   Supine posterior pelvic tilt with adduction ball squeeze 10x 3 second holds Supine posterior pelvic tilt with abduction , 10x 3 second holds seated posterior pelvic tilt 15x ; cueing for foot placement for optimal  muscle recruitment/body mechanics TrA activation squeezing swiss ball 10x 3 second holds  Ambulate in gym s/p therex and manual with improved heel strike, step length, and arm swing.  GTB straight arm lat pull downs seated with TrA activation 10x GTB rows with TrA activation 15x       Pt educated throughout session about proper posture and technique with exercises. Improved exercise technique, movement at target joints, use of target muscles after min to mod verbal, visual, tactile cues   Patient's condition has the potential to improve in response to therapy. Maximum improvement is yet to be obtained. The anticipated improvement is attainable and reasonable in a generally predictable time.  Patient reports his pain is less frequent and more manageable however continues to be present.                  PT Education - 10/03/19 1250    Education Details  manual, body mechanics,    Person(s) Educated  Patient    Methods  Explanation;Demonstration;Tactile cues;Verbal cues    Comprehension  Returned demonstration;Verbalized understanding;Verbal cues required;Tactile cues required       PT Short Term Goals - 09/21/19 1749      PT SHORT TERM GOAL #1   Title  Patient will be independent in home exercise program to improve strength/mobility for better functional independence with ADLs.    Baseline  9/8: HEP compliant    Time  4    Period  Weeks    Status  Achieved      PT SHORT TERM GOAL #2   Title  After 4 weeks pt will report consistant pain from sitting to standing up to 5 minutes without increases.    Baseline  pain increases from no pain to pain upon standing 10/7: increased by 2  points    Time  2    Period  Weeks    Status  Partially Met    Target Date  10/05/19        PT Long Term Goals - 09/21/19 1751      PT LONG TERM GOAL #1   Title  Afte r8 weeks patient will improve walking tolerane to covering >1000 in 6MWT with LRAD.    Baseline  pain with limited  community distance AMB 9/8: patient deferred due to pain from weekend 10/7: >1000 ft    Time  8    Period  Weeks    Status  Achieved      PT LONG TERM GOAL #2   Title  After 8 weeks pt will report improved independence in self management of symptoms excluding use of medication.    Baseline  currently uses TENS, Gabapentin, or heat  9/8: uses TENS and exercises to help, 10/7: use of HEP assists in management, still not fully under control on "bad days"    Time  4    Period  Weeks    Status  Partially Met    Target Date  10/19/19      PT LONG TERM GOAL #3   Title  After 8 weeks pt to demonstrate improved sciatic neurodynamics AEB PSLR >60 degrees bilat without subjective sciatic tension.    Baseline  45 degrees Right, 60 degrees left 10/7: able to perform    Time  8    Period  Weeks    Status  Achieved      PT LONG TERM GOAL #4   Title  Patient will experience 1 day or less of "high pain levels" indicating improved symptom frequency for quality of life and ability to perform functional tasks with limited pain increase    Baseline  10/7: 3 days of high pain    Time  4    Period  Weeks    Status  New    Target Date  10/19/19      PT LONG TERM GOAL #5   Title  Patient will reduce modified Oswestry score to <20 as to demonstrate minimal disability with ADLs including improved sleeping tolerance, walking/sitting tolerance etc for better mobility with ADLs.    Baseline  10/7: 41%    Time  4    Period  Weeks    Status  New    Target Date  10/19/19            Plan - 10/03/19 1255    Clinical Impression Statement  Patient's goals addressed on 09/21/19, please refer to this note for further details on progression.Patient's condition has the potential to improve in response to therapy. Maximum improvement is yet to be obtained. The anticipated improvement is attainable and reasonable in a generally predictable time. Patient has noticeable focal point tenderness to lumbar and sacral  musculature. Patient's muscle tissue length is improved with combination of manual and therex allowing for improved postural alignment and body mechanics. will benefit from skilled physical therapy to improve posture, functional capacity for mobility, and pain reduction for improved quality of life    Personal Factors and Comorbidities  Behavior Pattern;Comorbidity 1;Past/Current Experience;Fitness;Time since onset of injury/illness/exacerbation    Examination-Activity Limitations  Bed Mobility;Squat;Stairs;Locomotion Level;Stand    Examination-Participation Restrictions  Shop;Community Activity;Yard Work;Meal Prep    Stability/Clinical Decision Making  Unstable/Unpredictable    Rehab Potential  Fair    Clinical Impairments Affecting Rehab Potential  (-) HTN, reflux, bilateral TKA, diabetes, history of chronic back pain,recent move (+) good family support and understanding of medical field    PT Frequency  2x / week    PT Duration  4 weeks    PT Treatment/Interventions  ADLs/Self Care Home Management;Aquatic Therapy;Cryotherapy;Electrical Stimulation;Iontophoresis 28m/ml Dexamethasone;Moist Heat;Traction;Ultrasound;Balance training;Therapeutic exercise;Therapeutic activities;Functional mobility training;Stair training;Gait training;Neuromuscular re-education;Patient/family education;Manual techniques;Passive range of motion;Dry needling;Energy conservation;Taping    PT Next Visit Plan  Advance abdominal strengthening ex's as tolerated    Consulted and Agree with Plan of Care  Patient       Patient will benefit from skilled therapeutic intervention in order to improve the following deficits and impairments:  Abnormal gait, Decreased activity tolerance, Decreased balance, Decreased endurance, Decreased mobility, Decreased range of motion, Difficulty walking, Decreased strength, Hypomobility, Impaired flexibility, Impaired perceived functional ability, Increased muscle spasms, Postural dysfunction, Pain,  Increased fascial restricitons, Improper body mechanics, Obesity  Visit Diagnosis: Chronic midline low back pain without sciatica  Muscle weakness (generalized)     Problem List Patient Active Problem List   Diagnosis Date Noted  . Joint swelling 11/16/2018  . 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 Essential hypertension 09/09/2017  . Type 2 diabetes mellitus ([place]) 09/09/2017  . GERD (gastroesophageal reflux disease) 09/09/2017  . Testosterone deficiency 09/09/2017  . OSA (obstructive sleep apnea) 09/09/2017  . Chronic bilateral low back pain without sciatica 09/09/2017  . H/O knee surgery 04/27/2014  . 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 [place], [place], [address] Phone: [phone]   Fax:  [phone]  Name: Kadyn Guild MRN: [id number] Date of Birth: November 22, 1951

## 2019-10-04 ENCOUNTER — Other Ambulatory Visit: Payer: Self-pay | Admitting: Primary Care

## 2019-10-04 DIAGNOSIS — E119 Type 2 diabetes mellitus without complications: Secondary | ICD-10-CM

## 2019-10-05 ENCOUNTER — Other Ambulatory Visit: Payer: Self-pay

## 2019-10-05 ENCOUNTER — Ambulatory Visit: Payer: Medicare Other

## 2019-10-05 DIAGNOSIS — G8929 Other chronic pain: Secondary | ICD-10-CM | POA: Diagnosis not present

## 2019-10-05 DIAGNOSIS — E236 Other disorders of pituitary gland: Secondary | ICD-10-CM | POA: Diagnosis not present

## 2019-10-05 DIAGNOSIS — M545 Low back pain, unspecified: Secondary | ICD-10-CM

## 2019-10-05 DIAGNOSIS — R948 Abnormal results of function studies of other organs and systems: Secondary | ICD-10-CM | POA: Diagnosis not present

## 2019-10-05 DIAGNOSIS — M6281 Muscle weakness (generalized): Secondary | ICD-10-CM

## 2019-10-05 DIAGNOSIS — R7989 Other specified abnormal findings of blood chemistry: Secondary | ICD-10-CM | POA: Diagnosis not present

## 2019-10-05 NOTE — Therapy (Signed)
[place] MAIN [facility] SERVICES [address] [place], [place], [address] Phone: [phone]   Fax:  [phone]  Physical Therapy Treatment  Patient Details  Name: Adam Wise MRN: [id number] Date of Birth: 08-30-1951 No data recorded  Encounter Date: 10/05/2019  PT End of Session - 10/05/19 0942    Visit Number  21    Number of Visits  24    Date for PT Re-Evaluation  10/19/19    Authorization Type  Medicare    Authorization Time Period  1/10 PN on 10/19    PT Start Time  0844    PT Stop Time  0928    PT Time Calculation (min)  44 min    Activity Tolerance  Patient tolerated treatment well    Behavior During Therapy  [facility] for tasks assessed/performed       Past Medical History:  Diagnosis Date  . Chickenpox   . Chronic knee pain   . Chronic low back pain   . Essential hypertension   . GERD (gastroesophageal reflux disease)   . OSA (obstructive sleep apnea)   . Testosterone deficiency   . Type 2 diabetes mellitus ([place])     Past Surgical History:  Procedure Laterality Date  . COLONOSCOPY WITH PROPOFOL N/A 12/17/2018   Procedure: COLONOSCOPY WITH PROPOFOL;  Surgeon: Virgel Manifold, MD;  Location: [facility] ENDOSCOPY;  Service: Gastroenterology;  Laterality: N/A;  . JOINT REPLACEMENT Bilateral   . REPLACEMENT TOTAL KNEE BILATERAL  2015  . TONSILLECTOMY  1960    There were no vitals filed for this visit.  Subjective Assessment - 10/05/19 0847    Subjective  Patient reports feeling better, just stiff. Yesterday wasn't bad but wasn't the best either. No falls since last session.    Pertinent History  Pt suspects a 3-4 days after working out at [employer], pt started having severe "sharp" pain at the top of his gluteal cleft. He assumed it was his back, started going to chiro, initally better, but essentially unchanged, persisted for 2x/week for about 6 weeks. Pt eventually was referred to DC, pt given a variety of pain medications  (tramadol, 'oxy', flexaril), none of which helped.  Eventually saw the physiatrist who was questioning injections and/or ablasion. Pt also saw neurology Dr. Lacinda Axon who says patient has no operable issues as his diagnostic injections down to L5/S1 were higher than symptomatic level.    Limitations  Lifting;Standing;House hold activities    Currently in Pain?  Yes    Pain Score  2     Pain Location  Back    Pain Orientation  Lower    Pain Descriptors / Indicators  Aching    Pain Type  Chronic pain    Pain Onset  More than a month ago    Pain Frequency  Constant           Manual Prone Mobilizations CPA, UPA L1-sacrum grade II-III, noted hypomobility 5x10 seconds each LE; mid and upper thoracic J sweep pressure for postural alignment x 20 seconds x6 trials Sacral inferior mobilizations, central and lateral 6x 15 second pulses of grade II mobilizations STM to quadratus, lower lat, and lumbar paraspinals x 11 minutes with noted muscle tissue limitations with tenderness and trigger points noted. Implemented effleurage and ptrissage techniques along with use of metal tool  for reduction of muscle tissue tension and pain relief.    Supine:     IT band stretch 60 second holds , rollout with roller  60 seconds  IT band STM cross friction and along the tissue line with use of metal tool x 5 minutes, patient reporting great relief of symptoms.    Ther Ex: Patient guided in maintaining alignment and muscle activation for pain reduction and proper muscle activation.   Supine posterior pelvic tilt with adduction ball squeeze 10x 3 second holds Supine posterior pelvic tilt with abduction , 10x 3 second holds seated posterior pelvic tilt 15x ; cueing for foot placement for optimal muscle recruitment/body mechanics TrA activation squeezing swiss ball 10x 3 second holds  Ambulate in gym s/p therex and manual with improved heel strike, step length, and arm swing.   hooklying nerve glide bring knee to chest,  extend knee, df/pf 5x, 5x each LE use of towel to hold knee hooklying figure 4 SAD with strap 20 second holds x 5 trials ; pain reduction  Standing TrA contraction with swiss ball pressing into plinth table x 10 GTB straight arm lat pull downs seated with TrA activation 10x GTB rows with TrA activation 15x        Pt educated throughout session about proper posture and technique with exercises. Improved exercise technique, movement at target joints, use of target muscles after min to mod verbal, visual, tactile cues                      PT Education - 10/05/19 0942    Education Details  exercise technique, body mechanics    Person(s) Educated  Patient    Methods  Explanation;Demonstration;Tactile cues;Verbal cues    Comprehension  Verbalized understanding;Returned demonstration;Verbal cues required;Tactile cues required       PT Short Term Goals - 09/21/19 1749      PT SHORT TERM GOAL #1   Title  Patient will be independent in home exercise program to improve strength/mobility for better functional independence with ADLs.    Baseline  9/8: HEP compliant    Time  4    Period  Weeks    Status  Achieved      PT SHORT TERM GOAL #2   Title  After 4 weeks pt will report consistant pain from sitting to standing up to 5 minutes without increases.    Baseline  pain increases from no pain to pain upon standing 10/7: increased by 2  points    Time  2    Period  Weeks    Status  Partially Met    Target Date  10/05/19        PT Long Term Goals - 09/21/19 1751      PT LONG TERM GOAL #1   Title  Afte r8 weeks patient will improve walking tolerane to covering >1000 in 6MWT with LRAD.    Baseline  pain with limited community distance AMB 9/8: patient deferred due to pain from weekend 10/7: >1000 ft    Time  8    Period  Weeks    Status  Achieved      PT LONG TERM GOAL #2   Title  After 8 weeks pt will report improved independence in self management of symptoms  excluding use of medication.    Baseline  currently uses TENS, Gabapentin, or heat 9/8: uses TENS and exercises to help, 10/7: use of HEP assists in management, still not fully under control on "bad days"    Time  4    Period  Weeks    Status  Partially Met    Target  Date  10/19/19      PT LONG TERM GOAL #3   Title  After 8 weeks pt to demonstrate improved sciatic neurodynamics AEB PSLR >60 degrees bilat without subjective sciatic tension.    Baseline  45 degrees Right, 60 degrees left 10/7: able to perform    Time  8    Period  Weeks    Status  Achieved      PT LONG TERM GOAL #4   Title  Patient will experience 1 day or less of "high pain levels" indicating improved symptom frequency for quality of life and ability to perform functional tasks with limited pain increase    Baseline  10/7: 3 days of high pain    Time  4    Period  Weeks    Status  New    Target Date  10/19/19      PT LONG TERM GOAL #5   Title  Patient will reduce modified Oswestry score to <20 as to demonstrate minimal disability with ADLs including improved sleeping tolerance, walking/sitting tolerance etc for better mobility with ADLs.    Baseline  10/7: 41%    Time  4    Period  Weeks    Status  New    Target Date  10/19/19            Plan - 10/05/19 1249    Clinical Impression Statement  Patient demonstrated relief of symptoms with short arc distraction of LLE with figure four positioning to open piriformis position. Improved gait mechanics s/p manual and therex noted with improved ability to control pelvis and abdomen for optimal posture/bony alignment. Patient will benefit from skilled physical therapy to improve posture, functional capacity for mobility, and pain reduction for improved quality of life    Personal Factors and Comorbidities  Behavior Pattern;Comorbidity 1;Past/Current Experience;Fitness;Time since onset of injury/illness/exacerbation    Examination-Activity Limitations  Bed  Mobility;Squat;Stairs;Locomotion Level;Stand    Examination-Participation Restrictions  Shop;Community Activity;Yard Work;Meal Prep    Stability/Clinical Decision Making  Unstable/Unpredictable    Rehab Potential  Fair    Clinical Impairments Affecting Rehab Potential  (-) HTN, reflux, bilateral TKA, diabetes, history of chronic back pain,recent move (+) good family support and understanding of medical field    PT Frequency  2x / week    PT Duration  4 weeks    PT Treatment/Interventions  ADLs/Self Care Home Management;Aquatic Therapy;Cryotherapy;Electrical Stimulation;Iontophoresis '4mg'$ /ml Dexamethasone;Moist Heat;Traction;Ultrasound;Balance training;Therapeutic exercise;Therapeutic activities;Functional mobility training;Stair training;Gait training;Neuromuscular re-education;Patient/family education;Manual techniques;Passive range of motion;Dry needling;Energy conservation;Taping    PT Next Visit Plan  Advance abdominal strengthening ex's as tolerated    Consulted and Agree with Plan of Care  Patient       Patient will benefit from skilled therapeutic intervention in order to improve the following deficits and impairments:  Abnormal gait, Decreased activity tolerance, Decreased balance, Decreased endurance, Decreased mobility, Decreased range of motion, Difficulty walking, Decreased strength, Hypomobility, Impaired flexibility, Impaired perceived functional ability, Increased muscle spasms, Postural dysfunction, Pain, Increased fascial restricitons, Improper body mechanics, Obesity  Visit Diagnosis: Chronic midline low back pain without sciatica  Muscle weakness (generalized)     Problem List Patient Active Problem List   Diagnosis Date Noted  . Joint swelling 11/16/2018  . 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 Essential hypertension 09/09/2017  . Type 2 diabetes mellitus ([place]) 09/09/2017  . GERD (gastroesophageal reflux disease) 09/09/2017  . Testosterone deficiency 09/09/2017  . OSA  (obstructive sleep apnea) 09/09/2017  . Chronic bilateral low back pain without  sciatica 09/09/2017  . H/O knee surgery 04/27/2014  . 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

## 2019-10-06 ENCOUNTER — Other Ambulatory Visit: Payer: Self-pay | Admitting: "Endocrinology

## 2019-10-06 DIAGNOSIS — E236 Other disorders of pituitary gland: Secondary | ICD-10-CM

## 2019-10-10 ENCOUNTER — Other Ambulatory Visit: Payer: Self-pay

## 2019-10-10 ENCOUNTER — Ambulatory Visit: Payer: Medicare Other

## 2019-10-10 DIAGNOSIS — M6281 Muscle weakness (generalized): Secondary | ICD-10-CM

## 2019-10-10 DIAGNOSIS — M545 Low back pain, unspecified: Secondary | ICD-10-CM

## 2019-10-10 DIAGNOSIS — G8929 Other chronic pain: Secondary | ICD-10-CM

## 2019-10-10 NOTE — Therapy (Signed)
[place] MAIN [facility] SERVICES [address] [place], [place], [address] Phone: [phone]   Fax:  [phone]  Physical Therapy Treatment  Patient Details  Name: Adam Wise MRN: [id number] Date of Birth: 09-Aug-1951 No data recorded  Encounter Date: 10/10/2019  PT End of Session - 10/10/19 0841    Visit Number  22    Number of Visits  24    Date for PT Re-Evaluation  10/19/19    Authorization Type  Medicare    Authorization Time Period  2/10 PN on 10/19    PT Start Time  0844    PT Stop Time  0929    PT Time Calculation (min)  45 min    Activity Tolerance  Patient tolerated treatment well    Behavior During Therapy  [facility] for tasks assessed/performed       Past Medical History:  Diagnosis Date  . Chickenpox   . Chronic knee pain   . Chronic low back pain   . Essential hypertension   . GERD (gastroesophageal reflux disease)   . OSA (obstructive sleep apnea)   . Testosterone deficiency   . Type 2 diabetes mellitus ([place])     Past Surgical History:  Procedure Laterality Date  . COLONOSCOPY WITH PROPOFOL N/A 12/17/2018   Procedure: COLONOSCOPY WITH PROPOFOL;  Surgeon: Virgel Manifold, MD;  Location: [facility] ENDOSCOPY;  Service: Gastroenterology;  Laterality: N/A;  . JOINT REPLACEMENT Bilateral   . REPLACEMENT TOTAL KNEE BILATERAL  2015  . TONSILLECTOMY  1960    There were no vitals filed for this visit.  Subjective Assessment - 10/10/19 0845    Subjective  Patient reports saturday was an ok day, did a lot of walking and legs tightned up by end of day. Is feeling tight this morning due to the weather.    Pertinent History  Pt suspects a 3-4 days after working out at [employer], pt started having severe "sharp" pain at the top of his gluteal cleft. He assumed it was his back, started going to chiro, initally better, but essentially unchanged, persisted for 2x/week for about 6 weeks. Pt eventually was referred to DC, pt given a variety  of pain medications (tramadol, 'oxy', flexaril), none of which helped.  Eventually saw the physiatrist who was questioning injections and/or ablasion. Pt also saw neurology Dr. Lacinda Axon who says patient has no operable issues as his diagnostic injections down to L5/S1 were higher than symptomatic level.    Limitations  Lifting;Standing;House hold activities    Currently in Pain?  Yes    Pain Score  2     Pain Location  Back    Pain Orientation  Lower    Pain Descriptors / Indicators  Aching    Pain Type  Chronic pain    Pain Onset  More than a month ago    Pain Frequency  Constant             Manual Prone Mobilizations CPA, UPA L1-sacrum grade II-III, noted hypomobility 5x10 seconds each LE; mid and upper thoracic J sweep pressure for postural alignment x 20 seconds x6 trials Sacral inferior mobilizations, central and lateral 6x 15 second pulses of grade II mobilizations STM to quadratus, lower lat, and lumbar paraspinals x 11 minutes with noted muscle tissue limitations with tenderness and trigger points noted. Implemented effleurage and ptrissage techniques along with use of metal tool  for reduction of muscle tissue tension and pain relief.    Supine:  IT band stretch 60 second holds , rollout with roller 60 seconds  IT band STM cross friction and along the tissue line with use of metal tool x 5 minutes, patient reporting great relief of symptoms.    Ther Ex: Patient guided in maintaining alignment and muscle activation for pain reduction and proper muscle activation.   Supine posterior pelvic tilt with adduction ball squeeze 10x 3 second holds Supine posterior pelvic tilt with abduction , 10x 3 second holds seated posterior pelvic tilt 15x ; cueing for foot placement for optimal muscle recruitment/body mechanics TrA activation squeezing swiss ball 10x 3 second holds  Ambulate in gym s/p therex and manual with improved heel strike, step length, and arm swing.   hooklying nerve  glide bring knee to chest, extend knee, df/pf 5x, 5x each LE use of towel to hold knee hooklying figure 4 SAD with strap 20 second holds x 5 trials ; pain reduction  GTB straight arm lat pull downs seated with TrA activation 10x GTB rows with TrA activation 15x  seated ER for postural control 15x      Pt educated throughout session about proper posture and technique with exercises. Improved exercise technique, movement at target joints, use of target muscles after min to mod verbal, visual, tactile cues                     PT Education - 10/10/19 0840    Education Details  exercise technique, body mechanics, manual    Person(s) Educated  Patient    Methods  Explanation;Demonstration;Tactile cues;Verbal cues    Comprehension  Verbalized understanding;Returned demonstration;Verbal cues required;Tactile cues required       PT Short Term Goals - 09/21/19 1749      PT SHORT TERM GOAL #1   Title  Patient will be independent in home exercise program to improve strength/mobility for better functional independence with ADLs.    Baseline  9/8: HEP compliant    Time  4    Period  Weeks    Status  Achieved      PT SHORT TERM GOAL #2   Title  After 4 weeks pt will report consistant pain from sitting to standing up to 5 minutes without increases.    Baseline  pain increases from no pain to pain upon standing 10/7: increased by 2  points    Time  2    Period  Weeks    Status  Partially Met    Target Date  10/05/19        PT Long Term Goals - 09/21/19 1751      PT LONG TERM GOAL #1   Title  Afte r8 weeks patient will improve walking tolerane to covering >1000 in 6MWT with LRAD.    Baseline  pain with limited community distance AMB 9/8: patient deferred due to pain from weekend 10/7: >1000 ft    Time  8    Period  Weeks    Status  Achieved      PT LONG TERM GOAL #2   Title  After 8 weeks pt will report improved independence in self management of symptoms excluding use  of medication.    Baseline  currently uses TENS, Gabapentin, or heat 9/8: uses TENS and exercises to help, 10/7: use of HEP assists in management, still not fully under control on "bad days"    Time  4    Period  Weeks    Status  Partially Met  Target Date  10/19/19      PT LONG TERM GOAL #3   Title  After 8 weeks pt to demonstrate improved sciatic neurodynamics AEB PSLR >60 degrees bilat without subjective sciatic tension.    Baseline  45 degrees Right, 60 degrees left 10/7: able to perform    Time  8    Period  Weeks    Status  Achieved      PT LONG TERM GOAL #4   Title  Patient will experience 1 day or less of "high pain levels" indicating improved symptom frequency for quality of life and ability to perform functional tasks with limited pain increase    Baseline  10/7: 3 days of high pain    Time  4    Period  Weeks    Status  New    Target Date  10/19/19      PT LONG TERM GOAL #5   Title  Patient will reduce modified Oswestry score to <20 as to demonstrate minimal disability with ADLs including improved sleeping tolerance, walking/sitting tolerance etc for better mobility with ADLs.    Baseline  10/7: 41%    Time  4    Period  Weeks    Status  New    Target Date  10/19/19            Plan - 10/10/19 1309    Clinical Impression Statement  Patient presents with great motivation to physical therapy session. Is progressing with decreased episodes of pain and improved management of episodic pain. SAD alleviates pain and patient demonstrates improved gait mechanics by. Patient will benefit from skilled physical therapy to improve posture, functional capacity for mobility, and pain reduction for improved quality of life    Personal Factors and Comorbidities  Behavior Pattern;Comorbidity 1;Past/Current Experience;Fitness;Time since onset of injury/illness/exacerbation    Examination-Activity Limitations  Bed Mobility;Squat;Stairs;Locomotion Level;Stand     Examination-Participation Restrictions  Shop;Community Activity;Yard Work;Meal Prep    Stability/Clinical Decision Making  Unstable/Unpredictable    Rehab Potential  Fair    Clinical Impairments Affecting Rehab Potential  (-) HTN, reflux, bilateral TKA, diabetes, history of chronic back pain,recent move (+) good family support and understanding of medical field    PT Frequency  2x / week    PT Duration  4 weeks    PT Treatment/Interventions  ADLs/Self Care Home Management;Aquatic Therapy;Cryotherapy;Electrical Stimulation;Iontophoresis 109m/ml Dexamethasone;Moist Heat;Traction;Ultrasound;Balance training;Therapeutic exercise;Therapeutic activities;Functional mobility training;Stair training;Gait training;Neuromuscular re-education;Patient/family education;Manual techniques;Passive range of motion;Dry needling;Energy conservation;Taping    PT Next Visit Plan  Advance abdominal strengthening ex's as tolerated    Consulted and Agree with Plan of Care  Patient       Patient will benefit from skilled therapeutic intervention in order to improve the following deficits and impairments:  Abnormal gait, Decreased activity tolerance, Decreased balance, Decreased endurance, Decreased mobility, Decreased range of motion, Difficulty walking, Decreased strength, Hypomobility, Impaired flexibility, Impaired perceived functional ability, Increased muscle spasms, Postural dysfunction, Pain, Increased fascial restricitons, Improper body mechanics, Obesity  Visit Diagnosis: Chronic midline low back pain without sciatica  Muscle weakness (generalized)     Problem List Patient Active Problem List   Diagnosis Date Noted  . Joint swelling 11/16/2018  . 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 Essential hypertension 09/09/2017  . [place] 09/09/2017  . GERD (gastroesophageal reflux disease) 09/09/2017  . Testosterone deficiency 09/09/2017  . OSA (obstructive sleep apnea) 09/09/2017  . Chronic  bilateral low back pain without sciatica 09/09/2017  . H/O knee  surgery 04/27/2014  . 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 [place], [place], [address] Phone: [phone]   Fax:  [phone]  Name: Adam Wise MRN: [id number] Date of Birth: 1951-11-06

## 2019-10-12 ENCOUNTER — Other Ambulatory Visit: Payer: Self-pay

## 2019-10-12 ENCOUNTER — Ambulatory Visit: Payer: Medicare Other

## 2019-10-12 DIAGNOSIS — M6281 Muscle weakness (generalized): Secondary | ICD-10-CM | POA: Diagnosis not present

## 2019-10-12 DIAGNOSIS — M545 Low back pain, unspecified: Secondary | ICD-10-CM

## 2019-10-12 DIAGNOSIS — G8929 Other chronic pain: Secondary | ICD-10-CM

## 2019-10-12 NOTE — Therapy (Signed)
[place] MAIN [facility] SERVICES [address] [place], [place], [address] Phone: [phone]   Fax:  [phone]  Physical Therapy Treatment  Patient Details  Name: Adam Wise MRN: [id number] Date of Birth: 1951/07/01 No data recorded  Encounter Date: 10/12/2019  PT End of Session - 10/12/19 0922    Visit Number  23    Number of Visits  24    Date for PT Re-Evaluation  10/19/19    Authorization Type  Medicare    Authorization Time Period  3/10 PN on 10/19    PT Start Time  0846    PT Stop Time  0930    PT Time Calculation (min)  44 min    Activity Tolerance  Patient tolerated treatment well    Behavior During Therapy  [facility] for tasks assessed/performed       Past Medical History:  Diagnosis Date  . Chickenpox   . Chronic knee pain   . Chronic low back pain   . Essential hypertension   . GERD (gastroesophageal reflux disease)   . OSA (obstructive sleep apnea)   . Testosterone deficiency   . Type 2 diabetes mellitus ([place])     Past Surgical History:  Procedure Laterality Date  . COLONOSCOPY WITH PROPOFOL N/A 12/17/2018   Procedure: COLONOSCOPY WITH PROPOFOL;  Surgeon: Virgel Manifold, MD;  Location: [facility] ENDOSCOPY;  Service: Gastroenterology;  Laterality: N/A;  . JOINT REPLACEMENT Bilateral   . REPLACEMENT TOTAL KNEE BILATERAL  2015  . TONSILLECTOMY  1960    There were no vitals filed for this visit.  Subjective Assessment - 10/12/19 0921    Subjective  Patient presents with low pain levels, reports he bent over last night and felt a pop. Has been compliant with HEP.    Pertinent History  Pt suspects a 3-4 days after working out at [employer], pt started having severe "sharp" pain at the top of his gluteal cleft. He assumed it was his back, started going to chiro, initally better, but essentially unchanged, persisted for 2x/week for about 6 weeks. Pt eventually was referred to DC, pt given a variety of pain medications (tramadol,  'oxy', flexaril), none of which helped.  Eventually saw the physiatrist who was questioning injections and/or ablasion. Pt also saw neurology Dr. Lacinda Axon who says patient has no operable issues as his diagnostic injections down to L5/S1 were higher than symptomatic level.    Limitations  Lifting;Standing;House hold activities    Currently in Pain?  No/denies        Patient reports feeling good today, still a little tight on left side. Reached over last night and felt a pop, not painful.       Manual Prone Mobilizations CPA, UPA L1-sacrum grade II-III, noted hypomobility 5x10 seconds each LE; mid and upper thoracic J sweep pressure for postural alignment x 20 seconds x6 trials Sacral inferior mobilizations, central and lateral 6x 15 second pulses of grade II mobilizations STM to quadratus, lower lat, and lumbar paraspinals x 11 minutes with noted muscle tissue limitations with tenderness and trigger points noted. Implemented effleurage and ptrissage techniques along with use of metal tool  for reduction of muscle tissue tension and pain relief.    Supine:     IT band stretch 60 second holds , rollout with roller 60 seconds  IT band STM cross friction and along the tissue line with use of metal tool x 5 minutes, patient reporting great relief of symptoms.  Ther Ex: Patient guided in maintaining alignment and muscle activation for pain reduction and proper muscle activation.   Supine posterior pelvic tilt with adduction ball squeeze 10x 3 second holds Supine posterior pelvic tilt with abduction , 10x 3 second holds seated posterior pelvic tilt 15x ; cueing for foot placement for optimal muscle recruitment/body mechanics TrA activation squeezing swiss ball 10x 5 second holds  TrA activation with UE raise 10x each arm Ambulate in gym s/p therex and manual with improved heel strike, step length, and arm swing.   GTB straight arm lat pull downs seated with TrA activation 10x GTB rows with TrA  activation 15x       Pt educated throughout session about proper posture and technique with exercises. Improved exercise technique, movement at target joints, use of target muscles after min to mod verbal, visual, tactile cues   next session recert for 1x/week                 PT Education - 10/12/19 0922    Education Details  exercise technique, body mechanics, manual    Person(s) Educated  Patient    Methods  Explanation;Demonstration;Tactile cues;Verbal cues    Comprehension  Verbalized understanding;Returned demonstration;Verbal cues required;Tactile cues required       PT Short Term Goals - 09/21/19 1749      PT SHORT TERM GOAL #1   Title  Patient will be independent in home exercise program to improve strength/mobility for better functional independence with ADLs.    Baseline  9/8: HEP compliant    Time  4    Period  Weeks    Status  Achieved      PT SHORT TERM GOAL #2   Title  After 4 weeks pt will report consistant pain from sitting to standing up to 5 minutes without increases.    Baseline  pain increases from no pain to pain upon standing 10/7: increased by 2  points    Time  2    Period  Weeks    Status  Partially Met    Target Date  10/05/19        PT Long Term Goals - 09/21/19 1751      PT LONG TERM GOAL #1   Title  Afte r8 weeks patient will improve walking tolerane to covering >1000 in 6MWT with LRAD.    Baseline  pain with limited community distance AMB 9/8: patient deferred due to pain from weekend 10/7: >1000 ft    Time  8    Period  Weeks    Status  Achieved      PT LONG TERM GOAL #2   Title  After 8 weeks pt will report improved independence in self management of symptoms excluding use of medication.    Baseline  currently uses TENS, Gabapentin, or heat 9/8: uses TENS and exercises to help, 10/7: use of HEP assists in management, still not fully under control on "bad days"    Time  4    Period  Weeks    Status  Partially Met     Target Date  10/19/19      PT LONG TERM GOAL #3   Title  After 8 weeks pt to demonstrate improved sciatic neurodynamics AEB PSLR >60 degrees bilat without subjective sciatic tension.    Baseline  45 degrees Right, 60 degrees left 10/7: able to perform    Time  8    Period  Weeks    Status  Achieved  PT LONG TERM GOAL #4   Title  Patient will experience 1 day or less of "high pain levels" indicating improved symptom frequency for quality of life and ability to perform functional tasks with limited pain increase    Baseline  10/7: 3 days of high pain    Time  4    Period  Weeks    Status  New    Target Date  10/19/19      PT LONG TERM GOAL #5   Title  Patient will reduce modified Oswestry score to <20 as to demonstrate minimal disability with ADLs including improved sleeping tolerance, walking/sitting tolerance etc for better mobility with ADLs.    Baseline  10/7: 41%    Time  4    Period  Weeks    Status  New    Target Date  10/19/19            Plan - 10/12/19 7371    Clinical Impression Statement  Patient tolerated session well, is progressing with soft tissue length and postural stability strength. Next session will be recerted for 1x/week due to progression towards goals. Patient continues to be challenged with prolonged muscle recruitment. Patient will benefit from skilled physical therapy to improve posture, functional capacity for mobility, and pain reduction for improved quality of life    Personal Factors and Comorbidities  Behavior Pattern;Comorbidity 1;Past/Current Experience;Fitness;Time since onset of injury/illness/exacerbation    Examination-Activity Limitations  Bed Mobility;Squat;Stairs;Locomotion Level;Stand    Examination-Participation Restrictions  Shop;Community Activity;Yard Work;Meal Prep    Stability/Clinical Decision Making  Unstable/Unpredictable    Rehab Potential  Fair    Clinical Impairments Affecting Rehab Potential  (-) HTN, reflux, bilateral  TKA, diabetes, history of chronic back pain,recent move (+) good family support and understanding of medical field    PT Frequency  2x / week    PT Duration  4 weeks    PT Treatment/Interventions  ADLs/Self Care Home Management;Aquatic Therapy;Cryotherapy;Electrical Stimulation;Iontophoresis 36m/ml Dexamethasone;Moist Heat;Traction;Ultrasound;Balance training;Therapeutic exercise;Therapeutic activities;Functional mobility training;Stair training;Gait training;Neuromuscular re-education;Patient/family education;Manual techniques;Passive range of motion;Dry needling;Energy conservation;Taping    PT Next Visit Plan  recert for 1x/week    Consulted and Agree with Plan of Care  Patient       Patient will benefit from skilled therapeutic intervention in order to improve the following deficits and impairments:  Abnormal gait, Decreased activity tolerance, Decreased balance, Decreased endurance, Decreased mobility, Decreased range of motion, Difficulty walking, Decreased strength, Hypomobility, Impaired flexibility, Impaired perceived functional ability, Increased muscle spasms, Postural dysfunction, Pain, Increased fascial restricitons, Improper body mechanics, Obesity  Visit Diagnosis: Chronic midline low back pain without sciatica  Muscle weakness (generalized)     Problem List Patient Active Problem List   Diagnosis Date Noted  . Joint swelling 11/16/2018  . 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 Essential hypertension 09/09/2017  . Type 2 diabetes mellitus ([place] 09/09/2017  . GERD (gastroesophageal reflux disease) 09/09/2017  . Testosterone deficiency 09/09/2017  . OSA (obstructive sleep apnea) 09/09/2017  . Chronic bilateral low back pain without sciatica 09/09/2017  . H/O knee surgery 04/27/2014  . Hypogonadotropic [place]ogonadism ([place] 04/05/2014   MJanna Arch PT, DPT   10/12/2019, 9:34 AM  [place] [facility] [address] [place] [address]Phone: [phone]  Fax:  [phone] Name: SRahmir BeeverMRN: [id number] of Birth: [birth year]

## 2019-10-13 DIAGNOSIS — J432 Centrilobular emphysema: Secondary | ICD-10-CM | POA: Diagnosis not present

## 2019-10-13 DIAGNOSIS — G4733 Obstructive sleep apnea (adult) (pediatric): Secondary | ICD-10-CM | POA: Diagnosis not present

## 2019-10-19 ENCOUNTER — Other Ambulatory Visit: Payer: Self-pay

## 2019-10-19 ENCOUNTER — Ambulatory Visit
Admission: RE | Admit: 2019-10-19 | Discharge: 2019-10-19 | Disposition: A | Discharge status: Home / Self Care | Payer: Medicare Other | Source: Ambulatory Visit | Attending: "Endocrinology | Admitting: "Endocrinology

## 2019-10-19 ENCOUNTER — Ambulatory Visit: Payer: Medicare Other | Attending: Family Medicine

## 2019-10-19 DIAGNOSIS — M545 Low back pain, unspecified: Secondary | ICD-10-CM

## 2019-10-19 DIAGNOSIS — M6281 Muscle weakness (generalized): Secondary | ICD-10-CM | POA: Diagnosis not present

## 2019-10-19 DIAGNOSIS — E236 Other disorders of pituitary gland: Secondary | ICD-10-CM | POA: Insufficient documentation

## 2019-10-19 DIAGNOSIS — G8929 Other chronic pain: Secondary | ICD-10-CM | POA: Diagnosis not present

## 2019-10-19 LAB — POCT I-STAT CREATININE: Creatinine, Ser: 1.3 mg/dL — ABNORMAL HIGH (ref 0.61–1.24)

## 2019-10-19 MED ORDER — GADOBUTROL 1 MMOL/ML IV SOLN
10.0000 mL | Freq: Once | INTRAVENOUS | Status: AC | PRN
  Administered 2019-10-19: 10 mL via INTRAVENOUS

## 2019-10-19 NOTE — Addendum Note (Signed)
Addended by: Judene Companion on: 10/19/2019 09:55 AM   Modules accepted: Orders

## 2019-10-19 NOTE — Therapy (Signed)
[place] MAIN [facility] SERVICES [address] [place], [place], [address] Phone: [phone]   Fax:  [phone]  Physical Therapy Treatment/ RECERT  Patient Details  Name: Adam Wise MRN: [id number] Date of Birth: [birth year] No data recorded  Encounter Date: 10/19/2019  PT End of Session - 10/19/19 0809    Visit Number  24    Number of Visits  28    Date for PT Re-Evaluation  11/16/19    Authorization Type  Medicare    Authorization Time Period  4/10 PN on 81/82; recert 99/3    PT Start Time  0801    PT Stop Time  0844    PT Time Calculation (min)  43 min    Activity Tolerance  Patient tolerated treatment well    Behavior During Therapy  [facility] for tasks assessed/performed       Past Medical History:  Diagnosis Date  . Chickenpox   . Chronic knee pain   . Chronic low back pain   . Essential hypertension   . GERD (gastroesophageal reflux disease)   . OSA (obstructive sleep apnea)   . Testosterone deficiency   . Type 2 diabetes mellitus ([place])     Past Surgical History:  Procedure Laterality Date  . COLONOSCOPY WITH PROPOFOL N/A 12/17/2018   Procedure: COLONOSCOPY WITH PROPOFOL;  Surgeon: Virgel Manifold, MD;  Location: [facility] ENDOSCOPY;  Service: Gastroenterology;  Laterality: N/A;  . JOINT REPLACEMENT Bilateral   . REPLACEMENT TOTAL KNEE BILATERAL  2015  . TONSILLECTOMY  1960    There were no vitals filed for this visit.  Subjective Assessment - 10/19/19 0944    Subjective  Patient reports he is leaving for [facility] tomorrow for a few days for a family matter. Has been compliant with HEP, had one day of no pain for the first time in a while, pain is more manageable.    Pertinent History  Pt suspects a 3-4 days after working out at [employer], pt started having severe "sharp" pain at the top of his gluteal cleft. He assumed it was his back, started going to chiro, initally better, but essentially unchanged, persisted for 2x/week for  about 6 weeks. Pt eventually was referred to DC, pt given a variety of pain medications (tramadol, 'oxy', flexaril), none of which helped.  Eventually saw the physiatrist who was questioning injections and/or ablasion. Pt also saw neurology Dr. Lacinda Axon who says patient has no operable issues as his diagnostic injections down to L5/S1 were higher than symptomatic level.    Limitations  Lifting;Standing;House hold activities    Currently in Pain?  Yes    Pain Score  1     Pain Location  Back    Pain Orientation  Lower    Pain Descriptors / Indicators  Aching    Pain Type  Chronic pain    Pain Onset  More than a month ago    Pain Frequency  Constant          Current pain 1/10    Goals: Pain from sitting to standing: 2 points on worst day. On normal day increases by 1-2 points: Management of symptoms: easier to manage  Frequency of high pain level days: 2 x/week  3/10  MODI    Patient reports feeling good today, still a little tight on left side. Reached over last night and felt a pop, not painful.     Manual Prone Mobilizations CPA, UPA L1-sacrum grade II-III, noted hypomobility 5x10  seconds each LE; mid and upper thoracic J sweep pressure for postural alignment x 20 seconds x6 trials Sacral inferior mobilizations, central and lateral 6x 15 second pulses of grade II mobilizations STM to quadratus, lower lat, and lumbar paraspinals x 16 minutes with noted muscle tissue limitations with tenderness and trigger points noted. Implemented effleurage and ptrissage techniques along with use of metal tool  for reduction of muscle tissue tension and pain relief.      Ther Ex: Patient guided in maintaining alignment and muscle activation for pain reduction and proper muscle activation.    seated posterior pelvic tilt 15x ; cueing for foot placement for optimal muscle recruitment/body mechanics GTB straight arm lat pull downs seated with TrA activation 10x GTB rows with TrA activation  15x  Ambulate in gym s/p therex and manual with improved heel strike, step length, and arm swing.           Pt educated throughout session about proper posture and technique with exercises. Improved exercise technique, movement at target joints, use of target muscles after min to mod verbal, visual, tactile cues                       PT Education - 10/19/19 0753    Education Details  exercise technique, body mechanics, manual    Person(s) Educated  Patient    Methods  Explanation;Demonstration;Tactile cues;Verbal cues    Comprehension  Verbalized understanding;Returned demonstration;Verbal cues required;Tactile cues required       PT Short Term Goals - 10/19/19 0806      PT SHORT TERM GOAL #1   Title  Patient will be independent in home exercise program to improve strength/mobility for better functional independence with ADLs.    Baseline  9/8: HEP compliant    Time  4    Period  Weeks    Status  Achieved      PT SHORT TERM GOAL #2   Title  After 4 weeks pt will report consistant pain from sitting to standing up to 5 minutes without increases.    Baseline  pain increases from no pain to pain upon standing 10/7: increased by 2  points 11/4: 15 minutes before pain increases to 2/10    Time  2    Period  Weeks    Status  Achieved        PT Long Term Goals - 10/19/19 6861      PT LONG TERM GOAL #1   Title  Afte r8 weeks patient will improve walking tolerane to covering >1000 in 6MWT with LRAD.    Baseline  pain with limited community distance AMB 9/8: patient deferred due to pain from weekend 10/7: >1000 ft    Time  8    Period  Weeks    Status  Achieved      PT LONG TERM GOAL #2   Title  After 8 weeks pt will report improved independence in self management of symptoms excluding use of medication.    Baseline  currently uses TENS, Gabapentin, or heat 9/8: uses TENS and exercises to help, 10/7: use of HEP assists in management, still not fully under  control on "bad days" 11/4: easier to manage with use of exercise, STM, and stretches    Time  4    Period  Weeks    Status  Partially Met    Target Date  11/16/19      PT LONG TERM GOAL #3  Title  After 8 weeks pt to demonstrate improved sciatic neurodynamics AEB PSLR >60 degrees bilat without subjective sciatic tension.    Baseline  45 degrees Right, 60 degrees left 10/7: able to perform    Time  8    Period  Weeks    Status  Achieved      PT LONG TERM GOAL #4   Title  Patient will experience 1 day or less of "high pain levels" indicating improved symptom frequency for quality of life and ability to perform functional tasks with limited pain increase    Baseline  10/7: 3 days of high pain 11/4: 2x/week of 3/10 pain    Time  4    Period  Weeks    Status  Partially Met    Target Date  11/16/19      PT LONG TERM GOAL #5   Title  Patient will reduce modified Oswestry score to <20 as to demonstrate minimal disability with ADLs including improved sleeping tolerance, walking/sitting tolerance etc for better mobility with ADLs.    Baseline  10/7: 41% 11/4: 24%    Time  4    Period  Weeks    Status  Partially Met    Target Date  11/16/19            Plan - 10/19/19 5597    Clinical Impression Statement  Due to patient progression and improving ability to self regulate symptoms will decrease POC frequency to 1x/week. Patient is having less pain with less frequency and is compliant with HEP. Maximum improvement is yet to be obtained. The anticipated improvement is attainable and reasonable in a generally predictable time. Patient has noticeable focal point tenderness to lumbar and sacral musculature. Patient's muscle tissue length is improved with combination of manual and therex allowing for improved postural alignment and body mechanics. will benefit from skilled physical therapy to improve posture, functional capacity for mobility, and pain reduction for improved quality of life     Personal Factors and Comorbidities  Behavior Pattern;Comorbidity 1;Past/Current Experience;Fitness;Time since onset of injury/illness/exacerbation    Examination-Activity Limitations  Bed Mobility;Squat;Stairs;Locomotion Level;Stand    Examination-Participation Restrictions  Shop;Community Activity;Yard Work;Meal Prep    Stability/Clinical Decision Making  Unstable/Unpredictable    Rehab Potential  Fair    Clinical Impairments Affecting Rehab Potential  (-) HTN, reflux, bilateral TKA, diabetes, history of chronic back pain,recent move (+) good family support and understanding of medical field    PT Frequency  1x / week    PT Duration  4 weeks    PT Treatment/Interventions  ADLs/Self Care Home Management;Aquatic Therapy;Cryotherapy;Electrical Stimulation;Iontophoresis '4mg'$ /ml Dexamethasone;Moist Heat;Traction;Ultrasound;Balance training;Therapeutic exercise;Therapeutic activities;Functional mobility training;Stair training;Gait training;Neuromuscular re-education;Patient/family education;Manual techniques;Passive range of motion;Dry needling;Energy conservation;Taping    PT Next Visit Plan  progress HEP    Consulted and Agree with Plan of Care  Patient       Patient will benefit from skilled therapeutic intervention in order to improve the following deficits and impairments:  Abnormal gait, Decreased activity tolerance, Decreased balance, Decreased endurance, Decreased mobility, Decreased range of motion, Difficulty walking, Decreased strength, Hypomobility, Impaired flexibility, Impaired perceived functional ability, Increased muscle spasms, Postural dysfunction, Pain, Increased fascial restricitons, Improper body mechanics, Obesity  Visit Diagnosis: Chronic midline low back pain without sciatica  Muscle weakness (generalized)     Problem List Patient Active Problem List   Diagnosis Date Noted  . Joint swelling 11/16/2018  . 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 Essential hypertension  09/09/2017  . Type 2 diabetes  mellitus ([place]) 09/09/2017  . GERD (gastroesophageal reflux disease) 09/09/2017  . Testosterone deficiency 09/09/2017  . OSA (obstructive sleep apnea) 09/09/2017  . Chronic bilateral low back pain without sciatica 09/09/2017  . H/O knee surgery 04/27/2014  . 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

## 2019-10-26 ENCOUNTER — Ambulatory Visit: Payer: Medicare Other

## 2019-11-01 ENCOUNTER — Other Ambulatory Visit: Payer: Self-pay

## 2019-11-01 ENCOUNTER — Ambulatory Visit: Payer: Medicare Other

## 2019-11-01 ENCOUNTER — Other Ambulatory Visit (INDEPENDENT_AMBULATORY_CARE_PROVIDER_SITE_OTHER): Payer: Medicare Other

## 2019-11-01 ENCOUNTER — Ambulatory Visit (INDEPENDENT_AMBULATORY_CARE_PROVIDER_SITE_OTHER): Payer: Medicare Other

## 2019-11-01 DIAGNOSIS — I10 Essential (primary) hypertension: Secondary | ICD-10-CM

## 2019-11-01 DIAGNOSIS — E119 Type 2 diabetes mellitus without complications: Secondary | ICD-10-CM | POA: Diagnosis not present

## 2019-11-01 DIAGNOSIS — Z0000 Encounter for general adult medical examination without abnormal findings: Secondary | ICD-10-CM

## 2019-11-01 LAB — HEMOGLOBIN A1C: Hgb A1c MFr Bld: 7.6 % — ABNORMAL HIGH (ref 4.6–6.5)

## 2019-11-01 LAB — COMPREHENSIVE METABOLIC PANEL
ALT: 22 U/L (ref 0–53)
AST: 23 U/L (ref 0–37)
Albumin: 4.4 g/dL (ref 3.5–5.2)
Alkaline Phosphatase: 62 U/L (ref 39–117)
BUN: 15 mg/dL (ref 6–23)
CO2: 29 mEq/L (ref 19–32)
Calcium: 9.2 mg/dL (ref 8.4–10.5)
Chloride: 104 mEq/L (ref 96–112)
Creatinine, Ser: 1.19 mg/dL (ref 0.40–1.50)
GFR: 60.55 mL/min (ref >60.00)
Glucose, Bld: 158 mg/dL — ABNORMAL HIGH (ref 70–99)
Potassium: 4.7 mEq/L (ref 3.5–5.1)
Sodium: 141 mEq/L (ref 135–145)
Total Bilirubin: 0.3 mg/dL (ref 0.2–1.2)
Total Protein: 6.7 g/dL (ref 6.0–8.3)

## 2019-11-01 LAB — LIPID PANEL
Cholesterol: 118 mg/dL (ref 0–200)
HDL: 35.6 mg/dL — ABNORMAL LOW (ref >39.00)
LDL Cholesterol: 57 mg/dL (ref 0–99)
NonHDL: 82.88
Total CHOL/HDL Ratio: 3
Triglycerides: 130 mg/dL (ref 0.0–149.0)
VLDL: 26 mg/dL (ref 0.0–40.0)

## 2019-11-01 NOTE — Patient Instructions (Signed)
Adam Wise , Thank you for taking time to come for your Medicare Wellness Visit. I appreciate your ongoing commitment to your health goals. Please review the following plan we discussed and let me know if I can assist you in the future.   Screening recommendations/referrals: Colonoscopy: Up to date, completed 12/17/2018 Recommended yearly ophthalmology/optometry visit for glaucoma screening and checkup Recommended yearly dental visit for hygiene and checkup  Vaccinations: Influenza vaccine: discuss with provider  Pneumococcal vaccine: Completed series Tdap vaccine: Up to date, completed 08/27/2016 Shingles vaccine: discuss with provider    Advanced directives: Advance directive discussed with you today. I have provided a copy for you to complete at home and have notarized. Once this is complete please bring a copy in to our office so we can scan it into your chart.  Conditions/risks identified: diabetes, hypertension  Next appointment: 11/08/2019 @ 10:20 am   Preventive Care 65 Years and Older, Male Preventive care refers to lifestyle choices and visits with your health care provider that can promote health and wellness. What does preventive care include?  A yearly physical exam. This is also called an annual well check.  Dental exams once or twice a year.  Routine eye exams. Ask your health care provider how often you should have your eyes checked.  Personal lifestyle choices, including:  Daily care of your teeth and gums.  Regular physical activity.  Eating a healthy diet.  Avoiding tobacco and drug use.  Limiting alcohol use.  Practicing safe sex.  Taking low doses of aspirin every day.  Taking vitamin and mineral supplements as recommended by your health care provider. What happens during an annual well check? The services and screenings done by your health care provider during your annual well check will depend on your age, overall health, lifestyle risk factors, and  family history of disease. Counseling  Your health care provider may ask you questions about your:  Alcohol use.  Tobacco use.  Drug use.  Emotional well-being.  Home and relationship well-being.  Sexual activity.  Eating habits.  History of falls.  Memory and ability to understand (cognition).  Work and work Statistician. Screening  You may have the following tests or measurements:  Height, weight, and BMI.  Blood pressure.  Lipid and cholesterol levels. These may be checked every 5 years, or more frequently if you are over 34 years old.  Skin check.  Lung cancer screening. You may have this screening every year starting at age 60 if you have a 30-pack-year history of smoking and currently smoke or have quit within the past 15 years.  Fecal occult blood test (FOBT) of the stool. You may have this test every year starting at age 79.  Flexible sigmoidoscopy or colonoscopy. You may have a sigmoidoscopy every 5 years or a colonoscopy every 10 years starting at age 46.  Prostate cancer screening. Recommendations will vary depending on your family history and other risks.  Hepatitis C blood test.  Hepatitis B blood test.  Sexually transmitted disease (STD) testing.  Diabetes screening. This is done by checking your blood sugar (glucose) after you have not eaten for a while (fasting). You may have this done every 1-3 years.  Abdominal aortic aneurysm (AAA) screening. You may need this if you are a current or former smoker.  Osteoporosis. You may be screened starting at age 24 if you are at high risk. Talk with your health care provider about your test results, treatment options, and if necessary, the need for  more tests. Vaccines  Your health care provider may recommend certain vaccines, such as:  Influenza vaccine. This is recommended every year.  Tetanus, diphtheria, and acellular pertussis (Tdap, Td) vaccine. You may need a Td booster every 10 years.  Zoster  vaccine. You may need this after age 62.  Pneumococcal 13-valent conjugate (PCV13) vaccine. One dose is recommended after age 79.  Pneumococcal polysaccharide (PPSV23) vaccine. One dose is recommended after age 70. Talk to your health care provider about which screenings and vaccines you need and how often you need them. This information is not intended to replace advice given to you by your health care provider. Make sure you discuss any questions you have with your health care provider. Document Released: 12/28/2015 Document Revised: 08/20/2016 Document Reviewed: 10/02/2015 Elsevier Interactive Patient Education  2017 [employer] Prevention in the Home Falls can cause injuries. They can happen to people of all ages. There are many things you can do to make your home safe and to help prevent falls. What can I do on the outside of my home?  Regularly fix the edges of walkways and driveways and fix any cracks.  Remove anything that might make you trip as you walk through a door, such as a raised step or threshold.  Trim any bushes or trees on the path to your home.  Use bright outdoor lighting.  Clear any walking paths of anything that might make someone trip, such as rocks or tools.  Regularly check to see if handrails are loose or broken. Make sure that both sides of any steps have handrails.  Any raised decks and porches should have guardrails on the edges.  Have any leaves, snow, or ice cleared regularly.  Use sand or salt on walking paths during winter.  Clean up any spills in your garage right away. This includes oil or grease spills. What can I do in the bathroom?  Use night lights.  Install grab bars by the toilet and in the tub and shower. Do not use towel bars as grab bars.  Use non-skid mats or decals in the tub or shower.  If you need to sit down in the shower, use a plastic, non-slip stool.  Keep the floor dry. Clean up any water that spills on the  floor as soon as it happens.  Remove soap buildup in the tub or shower regularly.  Attach bath mats securely with double-sided non-slip rug tape.  Do not have throw rugs and other things on the floor that can make you trip. What can I do in the bedroom?  Use night lights.  Make sure that you have a light by your bed that is easy to reach.  Do not use any sheets or blankets that are too big for your bed. They should not hang down onto the floor.  Have a firm chair that has side arms. You can use this for support while you get dressed.  Do not have throw rugs and other things on the floor that can make you trip. What can I do in the kitchen?  Clean up any spills right away.  Avoid walking on wet floors.  Keep items that you use a lot in easy-to-reach places.  If you need to reach something above you, use a strong step stool that has a grab bar.  Keep electrical cords out of the way.  Do not use floor polish or wax that makes floors slippery. If you must use wax, use non-skid  floor wax.  Do not have throw rugs and other things on the floor that can make you trip. What can I do with my stairs?  Do not leave any items on the stairs.  Make sure that there are handrails on both sides of the stairs and use them. Fix handrails that are broken or loose. Make sure that handrails are as long as the stairways.  Check any carpeting to make sure that it is firmly attached to the stairs. Fix any carpet that is loose or worn.  Avoid having throw rugs at the top or bottom of the stairs. If you do have throw rugs, attach them to the floor with carpet tape.  Make sure that you have a light switch at the top of the stairs and the bottom of the stairs. If you do not have them, ask someone to add them for you. What else can I do to help prevent falls?  Wear shoes that:  Do not have high heels.  Have rubber bottoms.  Are comfortable and fit you well.  Are closed at the toe. Do not wear  sandals.  If you use a stepladder:  Make sure that it is fully opened. Do not climb a closed stepladder.  Make sure that both sides of the stepladder are locked into place.  Ask someone to hold it for you, if possible.  Clearly mark and make sure that you can see:  Any grab bars or handrails.  First and last steps.  Where the edge of each step is.  Use tools that help you move around (mobility aids) if they are needed. These include:  Canes.  Walkers.  Scooters.  Crutches.  Turn on the lights when you go into a dark area. Replace any light bulbs as soon as they burn out.  Set up your furniture so you have a clear path. Avoid moving your furniture around.  If any of your floors are uneven, fix them.  If there are any pets around you, be aware of where they are.  Review your medicines with your doctor. Some medicines can make you feel dizzy. This can increase your chance of falling. Ask your doctor what other things that you can do to help prevent falls. This information is not intended to replace advice given to you by your health care provider. Make sure you discuss any questions you have with your health care provider. Document Released: 09/27/2009 Document Revised: 05/08/2016 Document Reviewed: 01/05/2015 Elsevier Interactive Patient Education  2017 [employer].

## 2019-11-01 NOTE — Progress Notes (Addendum)
Subjective:   Adam Wise is a 68 y.o. male who presents for Medicare Annual/Subsequent preventive examination.  Review of Systems: N/A   This visit is being conducted through telemedicine via telephone at the nurse health advisor's home address due to the COVID-19 pandemic. This patient has given me verbal consent via doximity to conduct this visit, patient states they are participating from their home address. Patient and myself are on the telephone call. There is no referral for this visit. Some vital signs may be absent or patient reported.    Patient identification: identified by name, DOB, and current address   Cardiac Risk Factors include: advanced age (>11mn, >>20women);diabetes mellitus;hypertension;male gender     Objective:    Vitals: There were no vitals taken for this visit.  There is no height or weight on file to calculate BMI.  Advanced Directives 11/01/2019 12/17/2018 10/27/2018 09/16/2017  Does Patient Have a Medical Advance Directive? No Yes No Yes  [place] of [place] - -  [place]? - No - copy requested - -  Would patient like information on creating a medical advance directive? Yes (MAU/Ambulatory/Procedural Areas - Information given) No - Patient declined Yes (MAU/Ambulatory/Procedural Areas - Information given) -    Tobacco Social History   Tobacco Use  Smoking Status Former Smoker  Smokeless Tobacco Never Used     Counseling given: Not Answered   Clinical Intake:  Pre-visit preparation completed: Yes  Pain : 0-10 Pain Score: 3  Pain Type: Chronic pain Pain Location: Back Pain Orientation: Lower Pain Descriptors / Indicators: Aching Pain Onset: More than a month ago Pain Frequency: Intermittent     Nutritional Risks: None Diabetes: Yes CBG done?: No Did pt. bring in CBG monitor from home?: No  How often do you need to have someone help you when you  read instructions, pamphlets, or other written materials from your doctor or pharmacy?: 1 - Never What is the last grade level you completed in school?: 12th  Interpreter Needed?: No  Information entered by :: CJohnson, LPN  Past Medical History:  Diagnosis Date  . Chickenpox   . Chronic knee pain   . Chronic low back pain   . Essential hypertension   . GERD (gastroesophageal reflux disease)   . OSA (obstructive sleep apnea)   . Testosterone deficiency   . Type 2 diabetes mellitus ([place]    Past Surgical History:  Procedure Laterality Date  . COLONOSCOPY WITH PROPOFOL N/A 12/17/2018   Procedure: COLONOSCOPY WITH PROPOFOL;  Surgeon: TVirgel Manifold MD;  Location: [facility] ENDOSCOPY;  Service: Gastroenterology;  Laterality: N/A;  . JOINT REPLACEMENT Bilateral   . REPLACEMENT TOTAL KNEE BILATERAL  2015  . TONSILLECTOMY  1960   Family History  Problem Relation Age of Onset  . Cancer Mother   . Hypertension Mother   . Arthritis Father   . Asthma Father   . Cancer Father   . COPD Father   . Heart attack Father   . Hypertension Sister   . Cancer Sister   . Diabetes Sister   . Asthma Son   . Birth defects Maternal Grandfather   . Arthritis Paternal Grandmother   . Diabetes Paternal Grandmother   . Arthritis Paternal Grandfather   . Asthma Sister   . Cancer Sister   . COPD Sister   . Arthritis Sister   . Asthma Sister   . Diabetes Sister   .  Other Neg Hx        pituitary disorder   Social History   Socioeconomic History  . Marital status: Married    Spouse name: Not on file  . Number of children: Not on file  . Years of education: Not on file  . Highest education level: Not on file  Occupational History  . Not on file  Social Needs  . Financial resource strain: Not hard at all  . Food insecurity    Worry: Never true    Inability: Never true  . Transportation needs    Medical: No    Non-medical: No  Tobacco Use  . Smoking status: Former Research scientist (life sciences)  . Smokeless  tobacco: Never Used  Substance and Sexual Activity  . Alcohol use: Yes    Comment: rarely  . Drug use: Not Currently  . Sexual activity: Yes  Lifestyle  . Physical activity    Days per week: 0 days    Minutes per session: 0 min  . Stress: Not at all  Relationships  . Social Herbalist on phone: Not on file    Gets together: Not on file    Attends religious service: Not on file    Active member of club or organization: Not on file    Attends meetings of clubs or organizations: Not on file    Relationship status: Not on file  Other Topics Concern  . Not on file  Social History Narrative   Married.   Moved from [place].   Retired.    Outpatient Encounter Medications as of 11/01/2019  Medication Sig  . amLODipine (NORVASC) 10 MG tablet TAKE 1 TABLET BY MOUTH EVERY DAY  . aspirin EC 81 MG tablet Take by mouth.  . blood glucose meter kit and supplies KIT Dispense based on patient and insurance preference. Use up to four times daily as directed.  . Coenzyme Q10 (COQ10 PO) Take 300 mg by mouth daily.  Marland Kitchen esomeprazole (NEXIUM) 20 MG capsule Take 20 mg by mouth daily at 12 noon.  . fluticasone (FLONASE) 50 MCG/ACT nasal spray SPRAY 2 TIMES INTO EACH NOSTRIL DAILY AS DIRECTED  . gabapentin (NEURONTIN) 300 MG capsule Take by mouth.  Marland Kitchen glipiZIDE (GLUCOTROL) 10 MG tablet Take 1 tablet (10 mg total) by mouth 2 (two) times daily before a meal. For diabetes.  Marland Kitchen KRILL OIL PO Take 350 mg by mouth daily.  . Lancets MISC USE UP TO 3 TIMES DAILY AS DIRECTED  . lisinopril (ZESTRIL) 5 MG tablet TAKE 1 TABLET BY MOUTH EVERY DAY  . metFORMIN (GLUCOPHAGE) 1000 MG tablet TAKE 1 TABLET (1,000 MG TOTAL) BY MOUTH 2 (TWO) TIMES DAILY WITH A MEAL.  . Multiple Vitamin (MULTI-VITAMINS) TABS Take 1 tablet by mouth daily.   . ONE TOUCH ULTRA TEST test strip USE UP TO 4 TIMES DAILY AS DIRECTED  . pioglitazone (ACTOS) 45 MG tablet TAKE 1 TABLET BY MOUTH EVERY DAY  . pravastatin (PRAVACHOL) 40 MG tablet TAKE  1 TABLET BY MOUTH EVERY DAY IN THE EVENING FOR CHOLESTEROL  . sildenafil (VIAGRA) 50 MG tablet TAKE 1 TABLET BY MOUTH EVERY DAY AS NEEDED  . Syringe/Needle, Disp, (SYRINGE 3CC/22GX1") 22G X 1" 3 ML MISC USE AS INSTRUCTED FOR TESTOSTERONE INJECTION EVERY 2 WEEKS  . testosterone cypionate (DEPOTESTOSTERONE CYPIONATE) 200 MG/ML injection Inject 200 mg into the muscle every 14 (fourteen) days.   No facility-administered encounter medications on file as of 11/01/2019.     Activities of Daily  Living In your present state of health, do you have any difficulty performing the following activities: 11/01/2019  Hearing? N  Vision? N  Difficulty concentrating or making decisions? N  Walking or climbing stairs? N  Dressing or bathing? N  Doing errands, shopping? N  Preparing Food and eating ? N  Using the Toilet? N  In the past six months, have you accidently leaked urine? N  Do you have problems with loss of bowel control? N  Managing your Medications? N  Managing your Finances? N  Housekeeping or managing your Housekeeping? N  Some recent data might be hidden    Patient Care Team: [name], NP as PCP - General (Internal Medicine)   Assessment:   This is a routine wellness examination for Adam Wise.  Exercise Activities and Dietary recommendations Current Exercise Habits: The patient does not participate in regular exercise at present, Exercise limited by: None identified  Goals    . Increase physical activity     Starting 10/27/2018, I will attempt to exercise for 60 minutes 3 days per week.     . Patient Stated     11/01/2019, I will maintain and continue medications as prescribed.        Fall Risk Fall Risk  11/01/2019 10/27/2018 09/13/2018  Falls in the past year? 0 0 No  Number falls in past yr: 0 - -  Injury with Fall? 0 - -  Risk for fall due to : Medication side effect - -  Follow up Falls evaluation completed;Falls prevention discussed - -   Is the patient's  home free of loose throw rugs in walkways, pet beds, electrical cords, etc?   yes      Grab bars in the bathroom? no      Handrails on the stairs?   yes      Adequate lighting?   yes  Timed Get Up and Go Performed: N/A  Depression Screen PHQ 2/9 Scores 11/01/2019 10/27/2018  PHQ - 2 Score 0 0  PHQ- 9 Score 0 0    Cognitive Function MMSE - Mini Mental State Exam 11/01/2019 10/27/2018  Orientation to time 5 5  Orientation to Place 5 5  Registration 3 3  Attention/ Calculation 5 0  Recall 3 3  Language- name 2 objects - 0  Language- repeat 1 1  Language- follow 3 step command - 3  Language- read & follow direction - 0  Write a sentence - 0  Copy design - 0  Total score - 20  Mini Cog  Mini-Cog screen was completed. Maximum score is 22. A value of 0 denotes this part of the MMSE was not completed or the patient failed this part of the Mini-Cog screening.       Immunization History  Administered Date(s) Administered  . Influenza Inj Mdck Quad Pf 09/18/2017  . Influenza,inj,Quad PF,6+ Mos 10/27/2018  . Influenza-Unspecified 09/18/2017  . Pneumococcal Conjugate-13 08/17/2015  . Pneumococcal Polysaccharide-23 11/25/2012, 10/27/2018  . Td 08/27/2016    Qualifies for Shingles Vaccine? Yes  Screening Tests Health Maintenance  Topic Date Due  . FOOT EXAM  03/13/2019  . INFLUENZA VACCINE  07/16/2019  . HEMOGLOBIN A1C  08/18/2019  . OPHTHALMOLOGY EXAM  11/15/2019  . COLONOSCOPY  12/17/2021  . TETANUS/TDAP  08/27/2026  . Hepatitis C Screening  Completed  . PNA vac Low Risk Adult  Completed   Cancer Screenings: Lung: Low Dose CT Chest recommended if Age 27-80 years, 30 pack-year currently smoking OR have  quit w/in 15years. Patient does not qualify. Colorectal: completed 12/17/2018  Additional Screenings:  Hepatitis C Screening: 10/27/2018      Plan:    Patient will maintain and continue medications as prescribed.   I have personally reviewed and noted the following  in the patient's chart:   . Medical and social history . Use of alcohol, tobacco or illicit drugs  . Current medications and supplements . Functional ability and status . Nutritional status . Physical activity . Advanced directives . List of other physicians . Hospitalizations, surgeries, and ER visits in previous 12 months . Vitals . Screenings to include cognitive, depression, and falls . Referrals and appointments  In addition, I have reviewed and discussed with patient certain preventive protocols, quality metrics, and best practice recommendations. A written personalized care plan for preventive services as well as general preventive health recommendations were provided to patient.     Andrez Grime, LPN  12/07/4974

## 2019-11-01 NOTE — Progress Notes (Signed)
PCP notes:  Health Maintenance: Wants to discuss flu vaccine first   Abnormal Screenings: none   Patient concerns: none   Nurse concerns: none   Next PCP appt.: 11/08/2019 @ 10:20 am

## 2019-11-02 ENCOUNTER — Ambulatory Visit: Payer: Medicare Other

## 2019-11-02 ENCOUNTER — Other Ambulatory Visit: Payer: Self-pay

## 2019-11-02 DIAGNOSIS — G8929 Other chronic pain: Secondary | ICD-10-CM

## 2019-11-02 DIAGNOSIS — M6281 Muscle weakness (generalized): Secondary | ICD-10-CM

## 2019-11-02 DIAGNOSIS — M545 Low back pain: Secondary | ICD-10-CM | POA: Diagnosis not present

## 2019-11-02 NOTE — Therapy (Signed)
[place] MAIN [facility] SERVICES [address] [place], [place], [address] Phone: [phone]   Fax:  [phone]  Physical Therapy Treatment  Patient Details  Name: Adam Wise MRN: [id number] Date of Birth: [birth year] No data recorded  Encounter Date: 11/02/2019  PT End of Session - 11/02/19 0932    Visit Number  25    Number of Visits  28    Date for PT Re-Evaluation  11/16/19    Authorization Type  Medicare    Authorization Time Period  5/10 PN on 49/70; recert 26/3    PT Start Time  0800    PT Stop Time  0843    PT Time Calculation (min)  43 min    Activity Tolerance  Patient tolerated treatment well    Behavior During Therapy  [employer] for tasks assessed/performed       Past Medical History:  Diagnosis Date  . Chickenpox   . Chronic knee pain   . Chronic low back pain   . Essential hypertension   . GERD (gastroesophageal reflux disease)   . OSA (obstructive sleep apnea)   . Testosterone deficiency   . Type 2 diabetes mellitus ([place])     Past Surgical History:  Procedure Laterality Date  . COLONOSCOPY WITH PROPOFOL N/A 12/17/2018   Procedure: COLONOSCOPY WITH PROPOFOL;  Surgeon: Virgel Manifold, MD;  Location: [facility] ENDOSCOPY;  Service: Gastroenterology;  Laterality: N/A;  . JOINT REPLACEMENT Bilateral   . REPLACEMENT TOTAL KNEE BILATERAL  2015  . TONSILLECTOMY  1960    There were no vitals filed for this visit.  Subjective Assessment - 11/02/19 0931    Subjective  Patient returning after 2 weeks from last therapy session due to trip to [place] and having a cold. Back pain has been manageable during the two weeks with the exception of last night. Did his HEP and felt better but not as good as he has been feeling.    Pertinent History  Pt suspects a 3-4 days after working out at [employer], pt started having severe "sharp" pain at the top of his gluteal cleft. He assumed it was his back, started going to chiro, initally better,  but essentially unchanged, persisted for 2x/week for about 6 weeks. Pt eventually was referred to DC, pt given a variety of pain medications (tramadol, 'oxy', flexaril), none of which helped.  Eventually saw the physiatrist who was questioning injections and/or ablasion. Pt also saw neurology Dr. Lacinda Axon who says patient has no operable issues as his diagnostic injections down to L5/S1 were higher than symptomatic level.    Limitations  Lifting;Standing;House hold activities    Currently in Pain?  Yes    Pain Score  3     Pain Location  Back    Pain Orientation  Lower    Pain Descriptors / Indicators  Aching    Pain Type  Chronic pain    Pain Onset  More than a month ago    Pain Frequency  Intermittent        Manual Prone Mobilizations CPA, UPA L1-sacrum grade II-III, noted hypomobility 5x10 seconds each LE; mid and upper thoracic J sweep pressure for postural alignment x 20 seconds x6 trials Sacral inferior mobilizations, central and lateral 6x 15 second pulses of grade II mobilizations STM to quadratus, lower lat, and lumbar paraspinals x 18 minutes with noted muscle tissue limitations with tenderness and trigger points noted. Implemented effleurage and ptrissage techniques along with use of metal tool  for reduction of muscle tissue tension and pain relief.      Ther Ex: Patient guided in maintaining alignment and muscle activation for pain reduction and proper muscle activation.     seated posterior pelvic tilt 15x ; cueing for foot placement for optimal muscle recruitment/body mechanics GTB straight arm lat pull downs seated with TrA activation 10x GTB rows with TrA activation 15x   Ambulate in gym s/p therex and manual with improved heel strike, step length, and arm swing.            Pt educated throughout session about proper posture and technique with exercises. Improved exercise technique, movement at target joints, use of target muscles after min to mod verbal, visual, tactile  cues                    PT Education - 11/02/19 0932    Education Details  exercise technique, body mechanics, manual    Person(s) Educated  Patient    Methods  Explanation;Demonstration;Tactile cues;Verbal cues    Comprehension  Verbalized understanding;Returned demonstration;Verbal cues required;Tactile cues required       PT Short Term Goals - 10/19/19 0806      PT SHORT TERM GOAL #1   Title  Patient will be independent in home exercise program to improve strength/mobility for better functional independence with ADLs.    Baseline  9/8: HEP compliant    Time  4    Period  Weeks    Status  Achieved      PT SHORT TERM GOAL #2   Title  After 4 weeks pt will report consistant pain from sitting to standing up to 5 minutes without increases.    Baseline  pain increases from no pain to pain upon standing 10/7: increased by 2  points 11/4: 15 minutes before pain increases to 2/10    Time  2    Period  Weeks    Status  Achieved        PT Long Term Goals - 10/19/19 9767      PT LONG TERM GOAL #1   Title  Afte r8 weeks patient will improve walking tolerane to covering >1000 in 6MWT with LRAD.    Baseline  pain with limited community distance AMB 9/8: patient deferred due to pain from weekend 10/7: >1000 ft    Time  8    Period  Weeks    Status  Achieved      PT LONG TERM GOAL #2   Title  After 8 weeks pt will report improved independence in self management of symptoms excluding use of medication.    Baseline  currently uses TENS, Gabapentin, or heat 9/8: uses TENS and exercises to help, 10/7: use of HEP assists in management, still not fully under control on "bad days" 11/4: easier to manage with use of exercise, STM, and stretches    Time  4    Period  Weeks    Status  Partially Met    Target Date  11/16/19      PT LONG TERM GOAL #3   Title  After 8 weeks pt to demonstrate improved sciatic neurodynamics AEB PSLR >60 degrees bilat without subjective sciatic  tension.    Baseline  45 degrees Right, 60 degrees left 10/7: able to perform    Time  8    Period  Weeks    Status  Achieved      PT LONG TERM GOAL #4   Title  Patient will experience 1 day or less of "high pain levels" indicating improved symptom frequency for quality of life and ability to perform functional tasks with limited pain increase    Baseline  10/7: 3 days of high pain 11/4: 2x/week of 3/10 pain    Time  4    Period  Weeks    Status  Partially Met    Target Date  11/16/19      PT LONG TERM GOAL #5   Title  Patient will reduce modified Oswestry score to <20 as to demonstrate minimal disability with ADLs including improved sleeping tolerance, walking/sitting tolerance etc for better mobility with ADLs.    Baseline  10/7: 41% 11/4: 24%    Time  4    Period  Weeks    Status  Partially Met    Target Date  11/16/19            Plan - 11/03/19 3546    Clinical Impression Statement  Patient reports pain decreased from 3/10 to 1/10 by end of session. Patient has limited muscle tissue lengfth of L paraspinals tha timproved with repeated manual. Patient will benefit from skilled physical therapy to improve posture, functional capacity for mobility, and pain reduction for improved quality of life    Personal Factors and Comorbidities  Behavior Pattern;Comorbidity 1;Past/Current Experience;Fitness;Time since onset of injury/illness/exacerbation    Examination-Activity Limitations  Bed Mobility;Squat;Stairs;Locomotion Level;Stand    Examination-Participation Restrictions  Shop;Community Activity;Yard Work;Meal Prep    Stability/Clinical Decision Making  Unstable/Unpredictable    Rehab Potential  Fair    Clinical Impairments Affecting Rehab Potential  (-) HTN, reflux, bilateral TKA, diabetes, history of chronic back pain,recent move (+) good family support and understanding of medical field    PT Frequency  1x / week    PT Duration  4 weeks    PT Treatment/Interventions  ADLs/Self  Care Home Management;Aquatic Therapy;Cryotherapy;Electrical Stimulation;Iontophoresis '4mg'$ /ml Dexamethasone;Moist Heat;Traction;Ultrasound;Balance training;Therapeutic exercise;Therapeutic activities;Functional mobility training;Stair training;Gait training;Neuromuscular re-education;Patient/family education;Manual techniques;Passive range of motion;Dry needling;Energy conservation;Taping    PT Next Visit Plan  progress HEP    Consulted and Agree with Plan of Care  Patient       Patient will benefit from skilled therapeutic intervention in order to improve the following deficits and impairments:  Abnormal gait, Decreased activity tolerance, Decreased balance, Decreased endurance, Decreased mobility, Decreased range of motion, Difficulty walking, Decreased strength, Hypomobility, Impaired flexibility, Impaired perceived functional ability, Increased muscle spasms, Postural dysfunction, Pain, Increased fascial restricitons, Improper body mechanics, Obesity  Visit Diagnosis: Chronic midline low back pain without sciatica  Muscle weakness (generalized)     Problem List Patient Active Problem List   Diagnosis Date Noted  . Joint swelling 11/16/2018  . 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 Essential hypertension 09/09/2017  . Type 2 diabetes mellitus ([place]) 09/09/2017  . GERD (gastroesophageal reflux disease) 09/09/2017  . Testosterone deficiency 09/09/2017  . OSA (obstructive sleep apnea) 09/09/2017  . Chronic bilateral low back pain without sciatica 09/09/2017  . H/O knee surgery 04/27/2014  . 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

## 2019-11-07 ENCOUNTER — Other Ambulatory Visit: Payer: Self-pay

## 2019-11-07 ENCOUNTER — Ambulatory Visit: Payer: Medicare Other

## 2019-11-07 DIAGNOSIS — G8929 Other chronic pain: Secondary | ICD-10-CM

## 2019-11-07 DIAGNOSIS — M545 Low back pain, unspecified: Secondary | ICD-10-CM

## 2019-11-07 DIAGNOSIS — M6281 Muscle weakness (generalized): Secondary | ICD-10-CM

## 2019-11-07 NOTE — Therapy (Signed)
[place] MAIN [facility] SERVICES [address] [place], [place], [address] Phone: [phone]   Fax:  [phone]  Physical Therapy Treatment  Patient Details  Name: Adam Wise MRN: [id number] Date of Birth: 13-Oct-1951 No data recorded  Encounter Date: 11/07/2019  PT End of Session - 11/07/19 1333    Visit Number  26    Number of Visits  28    Date for PT Re-Evaluation  11/16/19    Authorization Type  Medicare    Authorization Time Period  6/10 PN on 65/99; recert 35/7    PT Start Time  0759    PT Stop Time  0842    PT Time Calculation (min)  43 min    Activity Tolerance  Patient tolerated treatment well    Behavior During Therapy  [facility] for tasks assessed/performed       Past Medical History:  Diagnosis Date  . Chickenpox   . Chronic knee pain   . Chronic low back pain   . Essential hypertension   . GERD (gastroesophageal reflux disease)   . OSA (obstructive sleep apnea)   . Testosterone deficiency   . Type 2 diabetes mellitus ([place])     Past Surgical History:  Procedure Laterality Date  . COLONOSCOPY WITH PROPOFOL N/A 12/17/2018   Procedure: COLONOSCOPY WITH PROPOFOL;  Surgeon: Virgel Manifold, MD;  Location: [facility] ENDOSCOPY;  Service: Gastroenterology;  Laterality: N/A;  . JOINT REPLACEMENT Bilateral   . REPLACEMENT TOTAL KNEE BILATERAL  2015  . TONSILLECTOMY  1960    There were no vitals filed for this visit.  Subjective Assessment - 11/07/19 1331    Subjective  Patient reports compliance with HEP, has been able to control his pain. Is prepared for this to be his last session potentially pending HEP only period    Pertinent History  Pt suspects a 3-4 days after working out at [employer], pt started having severe "sharp" pain at the top of his gluteal cleft. He assumed it was his back, started going to chiro, initally better, but essentially unchanged, persisted for 2x/week for about 6 weeks. Pt eventually was referred to DC,  pt given a variety of pain medications (tramadol, 'oxy', flexaril), none of which helped.  Eventually saw the physiatrist who was questioning injections and/or ablasion. Pt also saw neurology Dr. Lacinda Axon who says patient has no operable issues as his diagnostic injections down to L5/S1 were higher than symptomatic level.    Limitations  Lifting;Standing;House hold activities    Currently in Pain?  Yes    Pain Score  2     Pain Location  Back    Pain Orientation  Lower    Pain Descriptors / Indicators  Aching    Pain Type  Chronic pain    Pain Onset  More than a month ago    Pain Frequency  Intermittent       Potentially a trial period of HEP only to ascertain if patient is prepared for discharge.    Manual Prone Mobilizations CPA, UPA L1-sacrum grade II-III, noted hypomobility 5x10 seconds each LE; mid and upper thoracic J sweep pressure for postural alignment x 20 seconds x6 trials Sacral inferior mobilizations, central and lateral 6x 15 second pulses of grade II mobilizations STM to quadratus, lower lat, and lumbar paraspinals x 18 minutes with noted muscle tissue limitations with tenderness and trigger points noted. Implemented effleurage and ptrissage techniques along with use of metal tool  for reduction of muscle  tissue tension and pain relief.   SAD with belt figure 4 position inferior glide 3x 30 second holds SAD with belt inferior glide hooklying one LE at a time, 4x 30 second holds each LE    Ther Ex: Patient guided in maintaining alignment and muscle activation for pain reduction and proper muscle activation.     seated posterior pelvic tilt 15x ; cueing for foot placement for optimal muscle recruitment/body mechanics   education on HEP and performance daily for symptom control demonstrated understanding.   Ambulate in gym s/p therex and manual with improved heel strike, step length, and arm swing.            Pt educated throughout session about proper posture and technique  with exercises. Improved exercise technique, movement at target joints, use of target muscles after min to mod verbal, visual, tactile cues     Due to patient's progression and ability to self regulate symptoms a trial period of home exercise to ascertain patient's status in regards to discharge would be beneficial. Patient agreeable to this and demonstrated understanding of HEP.                       PT Education - 11/07/19 1333    Education Details  exercise technique, body mechanics, POC , manual    Person(s) Educated  Patient    Methods  Explanation;Demonstration;Tactile cues;Verbal cues    Comprehension  Returned demonstration;Verbalized understanding;Verbal cues required;Tactile cues required       PT Short Term Goals - 10/19/19 0806      PT SHORT TERM GOAL #1   Title  Patient will be independent in home exercise program to improve strength/mobility for better functional independence with ADLs.    Baseline  9/8: HEP compliant    Time  4    Period  Weeks    Status  Achieved      PT SHORT TERM GOAL #2   Title  After 4 weeks pt will report consistant pain from sitting to standing up to 5 minutes without increases.    Baseline  pain increases from no pain to pain upon standing 10/7: increased by 2  points 11/4: 15 minutes before pain increases to 2/10    Time  2    Period  Weeks    Status  Achieved        PT Long Term Goals - 10/19/19 0852      PT LONG TERM GOAL #1   Title  Afte r8 weeks patient will improve walking tolerane to covering >1000 in 6MWT with LRAD.    Baseline  pain with limited community distance AMB 9/8: patient deferred due to pain from weekend 10/7: >1000 ft    Time  8    Period  Weeks    Status  Achieved      PT LONG TERM GOAL #2   Title  After 8 weeks pt will report improved independence in self management of symptoms excluding use of medication.    Baseline  currently uses TENS, Gabapentin, or heat 9/8: uses TENS and exercises to  help, 10/7: use of HEP assists in management, still not fully under control on "bad days" 11/4: easier to manage with use of exercise, STM, and stretches    Time  4    Period  Weeks    Status  Partially Met    Target Date  11/16/19      PT LONG TERM GOAL #3  Title  After 8 weeks pt to demonstrate improved sciatic neurodynamics AEB PSLR >60 degrees bilat without subjective sciatic tension.    Baseline  45 degrees Right, 60 degrees left 10/7: able to perform    Time  8    Period  Weeks    Status  Achieved      PT LONG TERM GOAL #4   Title  Patient will experience 1 day or less of "high pain levels" indicating improved symptom frequency for quality of life and ability to perform functional tasks with limited pain increase    Baseline  10/7: 3 days of high pain 11/4: 2x/week of 3/10 pain    Time  4    Period  Weeks    Status  Partially Met    Target Date  11/16/19      PT LONG TERM GOAL #5   Title  Patient will reduce modified Oswestry score to <20 as to demonstrate minimal disability with ADLs including improved sleeping tolerance, walking/sitting tolerance etc for better mobility with ADLs.    Baseline  10/7: 41% 11/4: 24%    Time  4    Period  Weeks    Status  Partially Met    Target Date  11/16/19            Plan - 11/07/19 1333    Clinical Impression Statement  Due to patient's progression and ability to self regulate symptoms a trial period of home exercise to ascertain patient's status in regards to discharge would be beneficial. Patient agreeable to this and demonstrated understanding of HEP.    Personal Factors and Comorbidities  Behavior Pattern;Comorbidity 1;Past/Current Experience;Fitness;Time since onset of injury/illness/exacerbation    Examination-Activity Limitations  Bed Mobility;Squat;Stairs;Locomotion Level;Stand    Examination-Participation Restrictions  Shop;Community Activity;Yard Work;Meal Prep    Stability/Clinical Decision Making  Unstable/Unpredictable     Rehab Potential  Fair    Clinical Impairments Affecting Rehab Potential  (-) HTN, reflux, bilateral TKA, diabetes, history of chronic back pain,recent move (+) good family support and understanding of medical field    PT Frequency  1x / week    PT Duration  4 weeks    PT Treatment/Interventions  ADLs/Self Care Home Management;Aquatic Therapy;Cryotherapy;Electrical Stimulation;Iontophoresis '4mg'$ /ml Dexamethasone;Moist Heat;Traction;Ultrasound;Balance training;Therapeutic exercise;Therapeutic activities;Functional mobility training;Stair training;Gait training;Neuromuscular re-education;Patient/family education;Manual techniques;Passive range of motion;Dry needling;Energy conservation;Taping    PT Next Visit Plan  progress HEP    Consulted and Agree with Plan of Care  Patient       Patient will benefit from skilled therapeutic intervention in order to improve the following deficits and impairments:  Abnormal gait, Decreased activity tolerance, Decreased balance, Decreased endurance, Decreased mobility, Decreased range of motion, Difficulty walking, Decreased strength, Hypomobility, Impaired flexibility, Impaired perceived functional ability, Increased muscle spasms, Postural dysfunction, Pain, Increased fascial restricitons, Improper body mechanics, Obesity  Visit Diagnosis: Chronic midline low back pain without sciatica  Muscle weakness (generalized)     Problem List Patient Active Problem List   Diagnosis Date Noted  . Joint swelling 11/16/2018  . 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 Essential hypertension 09/09/2017  . Type 2 diabetes mellitus ([place]) 09/09/2017  . GERD (gastroesophageal reflux disease) 09/09/2017  . Testosterone deficiency 09/09/2017  . OSA (obstructive sleep apnea) 09/09/2017  . Chronic bilateral low back pain without sciatica 09/09/2017  . H/O knee surgery 04/27/2014  . 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

## 2019-11-08 ENCOUNTER — Other Ambulatory Visit: Payer: Self-pay

## 2019-11-08 ENCOUNTER — Ambulatory Visit (INDEPENDENT_AMBULATORY_CARE_PROVIDER_SITE_OTHER): Payer: Medicare Other | Admitting: Primary Care

## 2019-11-08 VITALS — BP 136/84 | HR 90 | Temp 97.5°F | Ht 69.5 in | Wt 284.5 lb

## 2019-11-08 DIAGNOSIS — E349 Endocrine disorder, unspecified: Secondary | ICD-10-CM | POA: Diagnosis not present

## 2019-11-08 DIAGNOSIS — G4733 Obstructive sleep apnea (adult) (pediatric): Secondary | ICD-10-CM | POA: Diagnosis not present

## 2019-11-08 DIAGNOSIS — E785 Hyperlipidemia, unspecified: Secondary | ICD-10-CM | POA: Diagnosis not present

## 2019-11-08 DIAGNOSIS — E119 Type 2 diabetes mellitus without complications: Secondary | ICD-10-CM

## 2019-11-08 DIAGNOSIS — K219 Gastro-esophageal reflux disease without esophagitis: Secondary | ICD-10-CM

## 2019-11-08 DIAGNOSIS — Z23 Encounter for immunization: Secondary | ICD-10-CM

## 2019-11-08 DIAGNOSIS — G8929 Other chronic pain: Secondary | ICD-10-CM | POA: Diagnosis not present

## 2019-11-08 DIAGNOSIS — E230 Hypopituitarism: Secondary | ICD-10-CM | POA: Diagnosis not present

## 2019-11-08 DIAGNOSIS — E1169 Type 2 diabetes mellitus with other specified complication: Secondary | ICD-10-CM | POA: Insufficient documentation

## 2019-11-08 DIAGNOSIS — M545 Low back pain, unspecified: Secondary | ICD-10-CM

## 2019-11-08 NOTE — Progress Notes (Signed)
Subjective:    Patient ID: Adam Wise, male    DOB: [birth year], 68 y.o.   MRN: [id number]  HPI  Adam Wise is a 68 year old male who presents today who presents today for [place] Part 2.    Immunizations: -Tetanus: Completed in 2017 -Influenza: Due today -Shingles: Never completed, declines  -Pneumonia: Completed Prevnar in 2019, Pneumovax in 2019  Diet: He endorses a fair diet. He has been working on a [employer], reducing pasta, milk, sugar intake. Increasing more water.  Exercise: He is not exercising. Working with PT for chronic back pain.  Eye exam: Due this December 2020 Dental exam: Completes annually   Colonoscopy: Completed in 2020, due in 2023 PSA: 1.46 in October 2020 per endocrinology Hep C Screen: Negative  BP Readings from Last 3 Encounters:  11/08/19 136/84  12/17/18 112/67  11/16/18 130/76   Wt Readings from Last 3 Encounters:  11/08/19 284 lb 8 oz (129 kg)  05/24/19 288 lb (130.6 kg)  12/17/18 300 lb (136.1 kg)     Review of Systems  Constitutional: Negative for unexpected weight change.  HENT: Negative for rhinorrhea.   Respiratory: Negative for cough and shortness of breath.   Cardiovascular: Negative for chest pain.  Gastrointestinal: Negative for constipation and diarrhea.  Genitourinary: Negative for difficulty urinating.  Musculoskeletal: Positive for arthralgias and back pain.  Skin: Negative for rash.  Allergic/Immunologic: Negative for environmental allergies.  Neurological: Negative for dizziness and headaches.  Psychiatric/Behavioral: The patient is not nervous/anxious.        Past Medical History:  Diagnosis Date  . Chickenpox   . Chronic knee pain   . Chronic low back pain   . Essential hypertension   . GERD (gastroesophageal reflux disease)   . OSA (obstructive sleep apnea)   . Testosterone deficiency   . Type 2 diabetes mellitus ([place])      Social History   Socioeconomic History  . Marital status: Married    Spouse  name: Not on file  . Number of children: Not on file  . Years of education: Not on file  . Highest education level: Not on file  Occupational History  . Not on file  Social Needs  . Financial resource strain: Not hard at all  . Food insecurity    Worry: Never true    Inability: Never true  . Transportation needs    Medical: No    Non-medical: No  Tobacco Use  . Smoking status: Former Research scientist (life sciences)  . Smokeless tobacco: Never Used  Substance and Sexual Activity  . Alcohol use: Yes    Comment: rarely  . Drug use: Not Currently  . Sexual activity: Yes  Lifestyle  . Physical activity    Days per week: 0 days    Minutes per session: 0 min  . Stress: Not at all  Relationships  . Social Herbalist on phone: Not on file    Gets together: Not on file    Attends religious service: Not on file    Active member of club or organization: Not on file    Attends meetings of clubs or organizations: Not on file    Relationship status: Not on file  . Intimate partner violence    Fear of current or ex partner: No    Emotionally abused: No    Physically abused: No    Forced sexual activity: No  Other Topics Concern  . Not on file  Social History Narrative  Married.   Moved from [place].   Retired.    Past Surgical History:  Procedure Laterality Date  . COLONOSCOPY WITH PROPOFOL N/A 12/17/2018   Procedure: COLONOSCOPY WITH PROPOFOL;  Surgeon: Virgel Manifold, MD;  Location: [facility] ENDOSCOPY;  Service: Gastroenterology;  Laterality: N/A;  . JOINT REPLACEMENT Bilateral   . REPLACEMENT TOTAL KNEE BILATERAL  2015  . TONSILLECTOMY  1960    Family History  Problem Relation Age of Onset  . Cancer Mother   . Hypertension Mother   . Arthritis Father   . Asthma Father   . Cancer Father   . COPD Father   . Heart attack Father   . Hypertension Sister   . Cancer Sister   . Diabetes Sister   . Asthma Son   . Birth defects Maternal Grandfather   . Arthritis Paternal Grandmother   .  Diabetes Paternal Grandmother   . Arthritis Paternal Grandfather   . Asthma Sister   . Cancer Sister   . COPD Sister   . Arthritis Sister   . Asthma Sister   . Diabetes Sister   . Other Neg Hx        pituitary disorder    Allergies  Allergen Reactions  . Bupropion     Racing heart    Current Outpatient Medications on File Prior to Visit  Medication Sig Dispense Refill  . amLODipine (NORVASC) 10 MG tablet TAKE 1 TABLET BY MOUTH EVERY DAY 90 tablet 1  . aspirin EC 81 MG tablet Take by mouth.    . blood glucose meter kit and supplies KIT Dispense based on patient and insurance preference. Use up to four times daily as directed. 1 each 0  . Coenzyme Q10 (COQ10 PO) Take 300 mg by mouth daily.    Marland Kitchen esomeprazole (NEXIUM) 20 MG capsule Take 20 mg by mouth daily at 12 noon.    . fluticasone (FLONASE) 50 MCG/ACT nasal spray SPRAY 2 TIMES INTO EACH NOSTRIL DAILY AS DIRECTED    . gabapentin (NEURONTIN) 300 MG capsule Take 1 capsule by mouth 3 (three) times daily.    Marland Kitchen glipiZIDE (GLUCOTROL) 10 MG tablet Take 1 tablet (10 mg total) by mouth 2 (two) times daily before a meal. For diabetes. 180 tablet 3  . KRILL OIL PO Take 350 mg by mouth daily.    . Lancets MISC USE UP TO 3 TIMES DAILY AS DIRECTED 100 each 2  . lisinopril (ZESTRIL) 5 MG tablet TAKE 1 TABLET BY MOUTH EVERY DAY 90 tablet 1  . metFORMIN (GLUCOPHAGE) 1000 MG tablet TAKE 1 TABLET (1,000 MG TOTAL) BY MOUTH 2 (TWO) TIMES DAILY WITH A MEAL. 180 tablet 1  . Multiple Vitamin (MULTI-VITAMINS) TABS Take 1 tablet by mouth daily.     . ONE TOUCH ULTRA TEST test strip USE UP TO 4 TIMES DAILY AS DIRECTED 100 each 2  . pioglitazone (ACTOS) 45 MG tablet TAKE 1 TABLET BY MOUTH EVERY DAY 90 tablet 1  . pravastatin (PRAVACHOL) 40 MG tablet TAKE 1 TABLET BY MOUTH EVERY DAY IN THE EVENING FOR CHOLESTEROL 90 tablet 1  . sildenafil (VIAGRA) 50 MG tablet TAKE 1 TABLET BY MOUTH EVERY DAY AS NEEDED 12 tablet 1  . Syringe/Needle, Disp, (SYRINGE  3CC/22GX1") 22G X 1" 3 ML MISC USE AS INSTRUCTED FOR TESTOSTERONE INJECTION EVERY 2 WEEKS 10 each 1  . testosterone cypionate (DEPOTESTOSTERONE CYPIONATE) 200 MG/ML injection Inject 200 mg into the muscle every 14 (fourteen) days.     No current  facility-administered medications on file prior to visit.     BP 136/84   Pulse 90   Temp (!) 97.5 F (36.4 C) (Temporal)   Ht 5' 9.5" (1.765 m)   Wt 284 lb 8 oz (129 kg)   SpO2 94%   BMI 41.41 kg/m    Objective:   Physical Exam  Constitutional: He is oriented to person, place, and time. He appears well-nourished.  HENT:  Right Ear: Tympanic membrane and ear canal normal.  Left Ear: Tympanic membrane and ear canal normal.  Mouth/Throat: Oropharynx is clear and moist.  Eyes: Pupils are equal, round, and reactive to light. EOM are normal.  Neck: Neck supple.  Cardiovascular: Normal rate and regular rhythm.  Respiratory: Effort normal and breath sounds normal.  GI: Soft. Bowel sounds are normal. There is no abdominal tenderness.  Musculoskeletal: Normal range of motion.  Neurological: He is alert and oriented to person, place, and time. No cranial nerve deficit.  Reflex Scores:      Patellar reflexes are 2+ on the right side and 2+ on the left side. Skin: Skin is warm and dry.  Psychiatric: He has a normal mood and affect.           Assessment & Plan:

## 2019-11-08 NOTE — Assessment & Plan Note (Addendum)
Chronic. Completed PT recently and is doing much better. Plan is for continued weight loss, home exercises. Following with physiatry/orthopedics through [facility] clinic.  Continue gabapentin.

## 2019-11-08 NOTE — Patient Instructions (Signed)
Start exercising. You should be getting 150 minutes of exercise weekly.  Continue to work on a healthier diet. Ensure you are consuming 64 ounces of water daily.  Please have the eye doctor send me your eye exam.  Please schedule a follow up appointment in 6 months for diabetes check.  It was a pleasure to see you today!

## 2019-11-08 NOTE — Assessment & Plan Note (Signed)
Compliant to CPAP machine nightly, continue same. 

## 2019-11-08 NOTE — Assessment & Plan Note (Signed)
Recent LDL at goal on pravastatin. Continue same.

## 2019-11-08 NOTE — Assessment & Plan Note (Signed)
Following with endocrinology, continue current regimen. 

## 2019-11-08 NOTE — Assessment & Plan Note (Signed)
Doing well on Nexium OTC daily which is a reduction from BID before. Continue same. Encouraged weight loss and avoiding triggers.

## 2019-11-08 NOTE — Addendum Note (Signed)
Addended by: Jacqualin Combes on: 11/08/2019 01:04 PM   Modules accepted: Orders

## 2019-11-08 NOTE — Assessment & Plan Note (Signed)
Fasting AM glucose readings of 130's on average, recent increase 140's-150's.  Recent A1C of 7.6. Managed on statin and ACE. Compliant to regimen. Foot exam today. Eye exam due next month.  Follow up in 6 months.

## 2019-12-06 ENCOUNTER — Other Ambulatory Visit: Payer: Self-pay | Admitting: Primary Care

## 2019-12-06 DIAGNOSIS — E119 Type 2 diabetes mellitus without complications: Secondary | ICD-10-CM

## 2020-02-03 ENCOUNTER — Other Ambulatory Visit: Payer: Self-pay | Admitting: Primary Care

## 2020-02-03 DIAGNOSIS — I10 Essential (primary) hypertension: Secondary | ICD-10-CM

## 2020-02-03 DIAGNOSIS — E1169 Type 2 diabetes mellitus with other specified complication: Secondary | ICD-10-CM

## 2020-02-03 DIAGNOSIS — E119 Type 2 diabetes mellitus without complications: Secondary | ICD-10-CM

## 2020-02-07 ENCOUNTER — Other Ambulatory Visit: Payer: Self-pay | Admitting: Primary Care

## 2020-03-05 ENCOUNTER — Telehealth: Payer: Self-pay

## 2020-03-05 ENCOUNTER — Ambulatory Visit (INDEPENDENT_AMBULATORY_CARE_PROVIDER_SITE_OTHER): Payer: Medicare Other | Admitting: Family Medicine

## 2020-03-05 ENCOUNTER — Other Ambulatory Visit: Payer: Self-pay

## 2020-03-05 ENCOUNTER — Encounter: Payer: Self-pay | Admitting: Family Medicine

## 2020-03-05 VITALS — Ht 69.5 in | Wt 264.0 lb

## 2020-03-05 DIAGNOSIS — R05 Cough: Secondary | ICD-10-CM | POA: Diagnosis not present

## 2020-03-05 DIAGNOSIS — R059 Cough, unspecified: Secondary | ICD-10-CM

## 2020-03-05 DIAGNOSIS — R0981 Nasal congestion: Secondary | ICD-10-CM | POA: Diagnosis not present

## 2020-03-05 MED ORDER — AMOXICILLIN-POT CLAVULANATE 875-125 MG PO TABS: 1.0000 | ORAL_TABLET | Freq: Two times a day (BID) | ORAL | 0 refills | Status: AC

## 2020-03-05 NOTE — Telephone Encounter (Signed)
Home Day - Client TELEPHONE ADVICE RECORD [facility] Patient Name: Adam Wise Gender: Male DOB: [birth year] Age: 69 Y 67 M 3 D Return Phone Number: [phone] (Primary), [phone] (Secondary) Address[place] [address] Client [facility] Day - Client Client Site [place] - Day Physician Alma Friendly - NP Contact Type Call Who Is Calling Patient / Member / Family / Caregiver Call Type Triage / Clinical Relationship To Patient Self Return Phone Number [phone] (Primary) Chief Complaint WHEEZING Reason for Call Symptomatic / Request for Health Information Initial Comment Caller states sinus infectious, wheezing, coughing; hard to breathe. No fever; taking allergy meds which are not helping; CPAP. Translation No Nurse Assessment Nurse: Sumner Boast, RN, [place] Date/Time Eilene Ghazi Time): 03/05/2020 9:48:38 AM Confirm and document reason for call. If symptomatic, describe symptoms. ---Caller states sinus infectious, wheezing, coughing, wheezing, and having mild SOB. Taking [place]'s nasal meds, which is not helping. Uses a CPAP. Symptoms started 2 weeks ago. No fever. Has the patient had close contact with a person known or suspected to have the novel coronavirus illness OR traveled / lives in area with major community spread (including international travel) in the last 14 days from the onset of symptoms? * If Asymptomatic, screen for exposure and travel within the last 14 days. ---No Does the patient have any new or worsening symptoms? ---Yes Will a triage be completed? ---Yes Related visit to physician within the last 2 weeks? ---No Does the PT have any chronic conditions? (i.e. diabetes, asthma, this includes High risk factors for pregnancy, etc.) ---Yes List chronic conditions. ---Diabetes, Type II Is this a behavioral health or substance abuse call?  ---No Guidelines Guideline Title Affirmed Question Affirmed Notes Nurse Date/Time (Eastern Time) Breathing Difficulty [1] MILD difficulty breathing (e.g., minimal/ no SOB at rest, SOB with walking, pulse <100) AND [2] NEW-onset or WORSE than normal Sumner Boast, Valentino Nose 03/05/2020 9:53:00 AM PLEASE NOTE: All timestamps contained within this report are represented as [place] Standard Time. CONFIDENTIALTY NOTICE: This fax transmission is intended only for the addressee. It contains information that is legally privileged, confidential or otherwise protected from use or disclosure. If you are not the intended recipient, you are strictly prohibited from reviewing, disclosing, copying using or disseminating any of this information or taking any action in reliance on or regarding this information. If you have received this fax in error, please notify us immediately by telephone so that we can arrange for its return to [place]. Phone: [phone], Toll-Free: [phone], Fax: [phone] Page: 2 of 2 Call Id: [id number] [place]. Time Eilene Ghazi Time) Disposition Final User 03/05/2020 9:46:26 AM Send to Urgent Queue Jerrye Beavers 03/05/2020 10:03:46 AM See HCP within 4 Hours (or PCP triage) Yes Sumner Boast, RN, Teola Bradley Disagree/Comply Comply Caller Understands Yes PreDisposition Call Doctor Care Advice Given Per Guideline SEE HCP WITHIN 4 HOURS (OR PCP TRIAGE): CALL BACK IF: * You become worse. CARE ADVICE given per Breathing Difficulty (Adult) guideline. Comments User: Lorra Hals, RN Date/Time Eilene Ghazi Time): 03/05/2020 10:04:55 AM Caller connected to front desk and virtual appt scheduled for this morning at 11:40am. Spoke with Robin. Referrals REFERRED TO PCP OFFICE

## 2020-03-05 NOTE — Progress Notes (Signed)
     Adam Wise T. [name], MD Primary Care and Sports Medicine [facility] [place], [address] Phone: [phone]  FAX: [place] - 69 y.o. male  MRN [id number]  Date of Birth: 07/17/1951  Visit Date: 03/05/2020  PCP: [name], NP  Referred by: [name], NP Chief Complaint  Patient presents with  . Cough  . Nasal Congestion   Virtual Visit via Video Note:  I connected with  [name] on 03/05/2020 11:40 AM EDT by a video enabled telemedicine application and verified that I am speaking with the correct person using two identifiers.   Location patient: home computer, tablet, or smartphone Location provider: work or home office Consent: Verbal consent directly obtained from [employer]. Persons participating in the virtual visit: patient, provider  I discussed the limitations of evaluation and management by telemedicine and the availability of in person appointments. The patient expressed understanding and agreed to proceed.  History of Present Illness:  Sinus, 10-14 days.  He has been having some sinus congestion and cough.  This is been ongoing for about the last 10 to 14 days and has worsened.  He also is having some cough and weakness.  He denies any other symptoms.  Review of Systems as above: See pertinent positives and pertinent negatives per HPI No acute distress verbally   Observations/Objective/Exam:  An attempt was made to discern vital signs over the phone and per patient if applicable and possible.   General:    Alert, Oriented, appears well and in no acute distress  Pulmonary:     On inspection no signs of respiratory distress.  Psych / Neurological:     Pleasant and cooperative.  Assessment and Plan:    ICD-10-CM   1. Cough  R05   2. Sinus congestion  R09.81    Cannot exclude COVID-19 in this case.  He is going to set up for a COVID-19 test.  I am also going  to send him in some antibiotics, sinusitis is the most likely diagnosis.  I discussed the assessment and treatment plan with the patient. The patient was provided an opportunity to ask questions and all were answered. The patient agreed with the plan and demonstrated an understanding of the instructions.   The patient was advised to call back or seek an in-person evaluation if the symptoms worsen or if the condition fails to improve as anticipated.  Follow-up: prn unless noted otherwise below No follow-ups on file.  Meds ordered this encounter  Medications  . amoxicillin-clavulanate (AUGMENTIN) 875-125 MG tablet    Sig: Take 1 tablet by mouth 2 (two) times daily for 10 days.    Dispense:  20 tablet    Refill:  0   No orders of the defined types were placed in this encounter.   Signed,  Maud Deed. Cheyene Hamric, MD

## 2020-03-05 NOTE — Telephone Encounter (Signed)
Adam Wise scheduled virtual appt with Dr Lorelei Pont 03/05/20 at 11:40 AM.Adam Wise said pt did not sound in any distress and pt had been triaged by access nurse. FYI to Dr Lorelei Pont.

## 2020-03-07 ENCOUNTER — Ambulatory Visit: Payer: Medicare Other | Admitting: Family Medicine

## 2020-03-08 ENCOUNTER — Ambulatory Visit: Payer: Medicare Other | Attending: Internal Medicine

## 2020-03-08 DIAGNOSIS — Z20822 Contact with and (suspected) exposure to covid-19: Secondary | ICD-10-CM

## 2020-03-09 LAB — SARS-COV-2, NAA 2 DAY TAT

## 2020-03-09 LAB — NOVEL CORONAVIRUS, NAA: SARS-CoV-2, NAA: NOT DETECTED

## 2020-03-14 ENCOUNTER — Other Ambulatory Visit: Payer: Self-pay

## 2020-03-14 ENCOUNTER — Ambulatory Visit (INDEPENDENT_AMBULATORY_CARE_PROVIDER_SITE_OTHER): Payer: Medicare Other | Admitting: Internal Medicine

## 2020-03-14 ENCOUNTER — Ambulatory Visit (INDEPENDENT_AMBULATORY_CARE_PROVIDER_SITE_OTHER): Payer: Medicare Other

## 2020-03-14 ENCOUNTER — Telehealth: Payer: Self-pay | Admitting: *Deleted

## 2020-03-14 VITALS — BP 166/100 | HR 101 | Temp 98.6°F | Resp 14 | Ht 69.5 in | Wt 270.0 lb

## 2020-03-14 DIAGNOSIS — I10 Essential (primary) hypertension: Secondary | ICD-10-CM

## 2020-03-14 DIAGNOSIS — R05 Cough: Secondary | ICD-10-CM

## 2020-03-14 DIAGNOSIS — R062 Wheezing: Secondary | ICD-10-CM

## 2020-03-14 DIAGNOSIS — R059 Cough, unspecified: Secondary | ICD-10-CM

## 2020-03-14 DIAGNOSIS — J181 Lobar pneumonia, unspecified organism: Secondary | ICD-10-CM | POA: Diagnosis not present

## 2020-03-14 MED ORDER — HYDROCODONE-HOMATROPINE 5-1.5 MG/5ML PO SYRP: 5.0000 mL | ORAL_SOLUTION | Freq: Three times a day (TID) | ORAL | 0 refills | Status: DC | PRN

## 2020-03-14 MED ORDER — METHYLPREDNISOLONE SODIUM SUCC 125 MG IJ SOLR
125.0000 mg | Freq: Once | INTRAMUSCULAR | Status: AC
  Administered 2020-03-14: 125 mg via INTRAMUSCULAR

## 2020-03-14 MED ORDER — ALBUTEROL SULFATE HFA 108 (90 BASE) MCG/ACT IN AERS: 2.0000 | INHALATION_SPRAY | Freq: Four times a day (QID) | RESPIRATORY_TRACT | 0 refills | Status: DC | PRN

## 2020-03-14 MED ORDER — DOXYCYCLINE HYCLATE 100 MG PO TABS: 100.0000 mg | ORAL_TABLET | Freq: Two times a day (BID) | ORAL | 0 refills | Status: DC

## 2020-03-14 MED ORDER — BENZONATATE 100 MG PO CAPS: 100.0000 mg | ORAL_CAPSULE | Freq: Two times a day (BID) | ORAL | 0 refills | Status: DC | PRN

## 2020-03-14 NOTE — Telephone Encounter (Signed)
Noted and agree. 

## 2020-03-14 NOTE — Telephone Encounter (Signed)
Patient called stating that he did a virtual visit with Dr. Lorelei Pont 03/05/20 and was given Amoxicillin. Patient stated that the medication has not helped much. Patent stated that he still has a cough, wheezing, tightness in chest and some SOB.  Patient stated that he had a negative covid test on 03/08/20. Patient stated that he had his first covid vaccine on 03/12/20. Marland Kitchen After speaking with Allie Bossier NP patient was scheduled for the Respiratory Clinic this evening at 6:00pm. ER precautions were given to patient and he verbalized understanding.

## 2020-03-14 NOTE — Progress Notes (Signed)
Respiratory Clinic Note   Patient's initial symptoms began 3 weeks ago. Was seen by Dr. Lorelei Pont for virtual visit. Treated with presumed sinusitis with Augmentin.  Covid testing was completed on 3/25; results were negative.  Current symptoms include cough, SOB, wheezing, congestion, rhinorrhea.   Symptoms are not improving.  Symptomatic treatment includes: Mucinex, Robitussin   Significant medical comorbidities present include: PMH of sleep apnea, diabetes, dyslipidemia, obesity. Reports childhood h/o recurrent bronchitis.   Positive review of systems for Covid infection are documented above in HPI.   Physical exam:  Vitals:   03/14/20 1742  BP: (!) 166/100  Pulse: (!) 101  Resp: 14  Temp: 98.6 F (37 C)  SpO2: 93%   General appearance: Adequately nourished; no acute distress, increased work of breathing is present.   Lymphatic: No lymphadenopathy about the head, neck, axilla. Eyes: No conjunctival inflammation or lid edema is present. There is no scleral icterus. Ears:  External ear exam shows no significant lesions or deformities.   Nose:  External nasal examination shows no deformity or inflammation. Nasal mucosa are pink and moist without lesions, exudates Oral exam:  Lips and gums are healthy appearing. There is no oropharyngeal erythema or exudate. Neck:  No thyromegaly, masses, tenderness noted.    Heart:  Normal rate and regular rhythm. S1 and S2 normal without gallop, murmur, click, rub .  Lungs: Significant wheezing throughout all lung fields. Air movement is poor throughout. Coughing present. No SOB when speaking. No accessory muscle use.  Abdomen: Bowel sounds are normal. Abdomen is soft and nontender with no organomegaly, hernias, masses. GU: Deferred  Extremities:  No cyanosis, clubbing, edema  Neurologic exam :Grossly intact. Normal gait.  Skin: Warm & dry w/o tenting. No significant lesions or rash.  1. Cough Pulse Ox stable. CXR concerning for  infiltrate/consolidation. Will plan to follow up tomorrow if patient needs treatment for CAP.  Will treat cough with medications as below.  - DG Chest Portable 2 Views; Future - HYDROcodone-homatropine (HYCODAN) 5-1.5 MG/5ML syrup; Take 5 mLs by mouth every 8 (eight) hours as needed for cough.  Dispense: 120 mL; Refill: 0 - benzonatate (TESSALON) 100 MG capsule; Take 1 capsule (100 mg total) by mouth 2 (two) times daily as needed for cough.  Dispense: 30 capsule; Refill: 0  2. Wheezing Wheezing present and air movement poor throughout. Patient given SoluMedrol 125 mg IM in clinic. Also prescribed albuterol inhaler for at home use.  - albuterol (VENTOLIN HFA) 108 (90 Base) MCG/ACT inhaler; Inhale 2 puffs into the lungs every 6 (six) hours as needed for wheezing or shortness of breath.  Dispense: 18 g; Refill: 0 - methylPREDNISolone sodium succinate (SOLU-MEDROL) 125 mg/2 mL injection 125 mg  3. Essential hypertension BP is elevated. Patient will need to f/u with PCP.   4. Right upper lobe consolidation ([place]) CXR shows right upper lobe consolidation. Will add on Doxycyline for CAP treatment. Will need follow up imaging with PCP to ensure consolidation has resolved after appropriate treatment and resolution of symptoms. Additional symptomatic treatment noted above. Strict return precautions discussed.  - doxycycline (VIBRA-TABS) 100 MG tablet; Take 1 tablet (100 mg total) by mouth 2 (two) times daily.  Dispense: 28 tablet; Refill: 0    Phill Myron, D.O. 03/14/2020, 5:54 PM

## 2020-03-15 DIAGNOSIS — C3490 Malignant neoplasm of unspecified part of unspecified bronchus or lung: Secondary | ICD-10-CM

## 2020-03-15 DIAGNOSIS — C778 Secondary and unspecified malignant neoplasm of lymph nodes of multiple regions: Secondary | ICD-10-CM

## 2020-03-15 HISTORY — DX: Malignant neoplasm of unspecified part of unspecified bronchus or lung: C34.90

## 2020-03-15 HISTORY — DX: Secondary and unspecified malignant neoplasm of lymph nodes of multiple regions: C77.8

## 2020-03-20 ENCOUNTER — Other Ambulatory Visit: Payer: Self-pay | Admitting: Primary Care

## 2020-03-20 DIAGNOSIS — J189 Pneumonia, unspecified organism: Secondary | ICD-10-CM

## 2020-03-21 ENCOUNTER — Other Ambulatory Visit: Payer: Self-pay | Admitting: Primary Care

## 2020-03-21 DIAGNOSIS — E119 Type 2 diabetes mellitus without complications: Secondary | ICD-10-CM

## 2020-03-22 NOTE — Telephone Encounter (Signed)
Adam Wise, see below. Treated twice with antibiotics and no improvement. Negative Covid-19 test on 03/08/20. Can I see him in the office? He really needs evaluation. Could be an issue of something else entirely or un resolving pneumonia.

## 2020-03-22 NOTE — Telephone Encounter (Signed)
Pt is scheduled in next available appt on Monday.

## 2020-03-22 NOTE — Telephone Encounter (Signed)
Noted  

## 2020-03-26 ENCOUNTER — Other Ambulatory Visit
Admission: RE | Admit: 2020-03-26 | Discharge: 2020-03-26 | Disposition: A | Discharge status: Home / Self Care | Payer: Medicare Other | Source: Ambulatory Visit | Attending: Primary Care | Admitting: Primary Care

## 2020-03-26 ENCOUNTER — Ambulatory Visit
Admission: RE | Admit: 2020-03-26 | Discharge: 2020-03-26 | Disposition: A | Discharge status: Home / Self Care | Payer: Medicare Other | Source: Ambulatory Visit | Attending: Primary Care | Admitting: Primary Care

## 2020-03-26 ENCOUNTER — Encounter: Payer: Self-pay | Admitting: Primary Care

## 2020-03-26 ENCOUNTER — Ambulatory Visit (INDEPENDENT_AMBULATORY_CARE_PROVIDER_SITE_OTHER): Payer: Medicare Other | Admitting: Primary Care

## 2020-03-26 ENCOUNTER — Other Ambulatory Visit: Payer: Self-pay

## 2020-03-26 ENCOUNTER — Other Ambulatory Visit: Payer: Self-pay | Admitting: Primary Care

## 2020-03-26 ENCOUNTER — Other Ambulatory Visit: Payer: Self-pay | Admitting: *Deleted

## 2020-03-26 VITALS — BP 148/94 | HR 85 | Temp 96.3°F | Ht 69.5 in | Wt 267.2 lb

## 2020-03-26 DIAGNOSIS — R918 Other nonspecific abnormal finding of lung field: Secondary | ICD-10-CM

## 2020-03-26 DIAGNOSIS — R059 Cough, unspecified: Secondary | ICD-10-CM

## 2020-03-26 DIAGNOSIS — R0602 Shortness of breath: Secondary | ICD-10-CM

## 2020-03-26 DIAGNOSIS — R062 Wheezing: Secondary | ICD-10-CM

## 2020-03-26 DIAGNOSIS — R05 Cough: Secondary | ICD-10-CM

## 2020-03-26 DIAGNOSIS — R911 Solitary pulmonary nodule: Secondary | ICD-10-CM

## 2020-03-26 DIAGNOSIS — R5383 Other fatigue: Secondary | ICD-10-CM

## 2020-03-26 DIAGNOSIS — R0609 Other forms of dyspnea: Secondary | ICD-10-CM | POA: Insufficient documentation

## 2020-03-26 LAB — CBC WITH DIFFERENTIAL/PLATELET
Basophils Absolute: 0.1 10*3/uL (ref 0.0–0.1)
Basophils Relative: 0.8 % (ref 0.0–3.0)
Eosinophils Absolute: 0.2 10*3/uL (ref 0.0–0.7)
Eosinophils Relative: 2.1 % (ref 0.0–5.0)
HCT: 46.2 % (ref 39.0–52.0)
Hemoglobin: 15.3 g/dL (ref 13.0–17.0)
Lymphocytes Relative: 16.5 % (ref 12.0–46.0)
Lymphs Abs: 1.2 10*3/uL (ref 0.7–4.0)
MCHC: 33.1 g/dL (ref 30.0–36.0)
MCV: 90 fl (ref 78.0–100.0)
Monocytes Absolute: 0.6 10*3/uL (ref 0.1–1.0)
Monocytes Relative: 8.2 % (ref 3.0–12.0)
Neutro Abs: 5.5 10*3/uL (ref 1.4–7.7)
Neutrophils Relative %: 72.4 % (ref 43.0–77.0)
Platelets: 176 10*3/uL (ref 150.0–400.0)
RBC: 5.13 Mil/uL (ref 4.22–5.81)
RDW: 14.5 % (ref 11.5–15.5)
WBC: 7.5 10*3/uL (ref 4.0–10.5)

## 2020-03-26 LAB — BASIC METABOLIC PANEL
BUN: 15 mg/dL (ref 6–23)
CO2: 32 mEq/L (ref 19–32)
Calcium: 9.8 mg/dL (ref 8.4–10.5)
Chloride: 99 mEq/L (ref 96–112)
Creatinine, Ser: 1.31 mg/dL (ref 0.40–1.50)
GFR: 54.14 mL/min — ABNORMAL LOW (ref >60.00)
Glucose, Bld: 141 mg/dL — ABNORMAL HIGH (ref 70–99)
Potassium: 5.7 mEq/L — ABNORMAL HIGH (ref 3.5–5.1)
Sodium: 137 mEq/L (ref 135–145)

## 2020-03-26 LAB — BRAIN NATRIURETIC PEPTIDE: Pro B Natriuretic peptide (BNP): 41 pg/mL (ref 0.0–100.0)

## 2020-03-26 LAB — CREATININE, SERUM
Creatinine, Ser: 1.26 mg/dL — ABNORMAL HIGH (ref 0.61–1.24)
GFR calc Af Amer: >60 mL/min (ref >60)
GFR calc non Af Amer: 58 mL/min — ABNORMAL LOW (ref >60)

## 2020-03-26 MED ORDER — IOHEXOL 300 MG/ML  SOLN
75.0000 mL | Freq: Once | INTRAMUSCULAR | Status: AC | PRN
  Administered 2020-03-26: 75 mL via INTRAVENOUS

## 2020-03-26 MED ORDER — PREDNISONE 20 MG PO TABS: ORAL_TABLET | ORAL | 0 refills | Status: DC

## 2020-03-26 MED ORDER — METHYLPREDNISOLONE ACETATE 80 MG/ML IJ SUSP
80.0000 mg | Freq: Once | INTRAMUSCULAR | Status: AC
  Administered 2020-03-26: 80 mg via INTRAMUSCULAR

## 2020-03-26 NOTE — Assessment & Plan Note (Signed)
With wheezing and cough x 4 weeks. No improvement with oral antibiotics x 2 rounds. Overall stable today, normal oxygen saturation, not tachycardic.  Given little to no improvement with prior treatment, will check CT chest. Check labs including BMP, CBC, BNP. IM Depo Medrol 80 mg provided today. Oral steroids provided to start tomorrow.   Consider albuterol nebulizer if needed vs ICS. Await results of CT.

## 2020-03-26 NOTE — Addendum Note (Signed)
Addended by: Ellamae Sia on: 03/26/2020 11:06 AM   Modules accepted: Orders

## 2020-03-26 NOTE — Addendum Note (Signed)
Addended by: Ellamae Sia on: 03/26/2020 10:57 AM   Modules accepted: Orders

## 2020-03-26 NOTE — Progress Notes (Signed)
Subjective:    Patient ID: Adam Wise, male    DOB: 1951-05-03, 69 y.o.   MRN: [id number]  HPI  This visit occurred during the SARS-CoV-2 public health emergency.  Safety protocols were in place, including screening questions prior to the visit, additional usage of staff PPE, and extensive cleaning of exam room while observing appropriate contact time as indicated for disinfecting solutions.   Adam Wise is a 69 year old male with a history of hypertension, OSA, type 2 diabetes, testosterone deficiency, hyperlipidemia, CAP who presents today for follow up.  He was last evaluated on 03/14/20 at the respiratory clinic for symptoms of cough, SOB, wheezing, congestion, rhinorrhea that hadn't improved with Augmentin course. Negative for Covid. Chest xray suggestive of CAP (right upper lobe consolidative process) so Doxycycline, Hycodan, tessalon perles, albuterol were added. He was treated with IM solumedrol injection during his visit.   Since treatment, he's fatigued, feels exhausted, has constant wheezing, shortness of breath, cough, right upper lobe chest pressure. He's compliant to his Doxycycline. He's using his albuterol inhaler 3-4 times daily with temporary improvement.   He denies a diagnosis of asthma as a child, but had recurrent bronchitis or pneumonia during childhood. Strong family history of asthma and COPD. He will smoke the occasional cigar. Cough is worse in the morning and mid afternoon. He's overall sleeping okay due to Hycodan cough syrup.   BP Readings from Last 3 Encounters:  03/26/20 (!) 148/94  03/14/20 (!) 166/100  11/08/19 136/84     Review of Systems  Constitutional: Positive for fatigue. Negative for chills and fever.  HENT: Positive for congestion.   Respiratory: Positive for cough, shortness of breath and wheezing.   Cardiovascular: Negative for chest pain.  Allergic/Immunologic: Positive for environmental allergies.       Past Medical History:  Diagnosis  Date  . Chickenpox   . Chronic knee pain   . Chronic low back pain   . Essential hypertension   . GERD (gastroesophageal reflux disease)   . OSA (obstructive sleep apnea)   . Testosterone deficiency   . Type 2 diabetes mellitus ([place])      Social History   Socioeconomic History  . Marital status: Married    Spouse name: Not on file  . Number of children: Not on file  . Years of education: Not on file  . Highest education level: Not on file  Occupational History  . Not on file  Tobacco Use  . Smoking status: Former Research scientist (life sciences)  . Smokeless tobacco: Never Used  Substance and Sexual Activity  . Alcohol use: Yes    Comment: rarely  . Drug use: Not Currently  . Sexual activity: Yes  Other Topics Concern  . Not on file  Social History Narrative   Married.   Moved from [place].   Retired.   Social Determinants of Health   Financial Resource Strain: Low Risk   . Difficulty of Paying Living Expenses: Not hard at all  Food Insecurity: No Food Insecurity  . Worried About Charity fundraiser in the Last Year: Never true  . Ran Out of Food in the Last Year: Never true  Transportation Needs: No Transportation Needs  . Lack of Transportation (Medical): No  . Lack of Transportation (Non-Medical): No  Physical Activity: Inactive  . Days of Exercise per Week: 0 days  . Minutes of Exercise per Session: 0 min  Stress: No Stress Concern Present  . Feeling of Stress : Not at all  Social Connections:   . Frequency of Communication with Friends and Family:   . Frequency of Social Gatherings with Friends and Family:   . Attends Religious Services:   . Active Member of Clubs or Organizations:   . Attends Archivist Meetings:   Marland Kitchen Marital Status:   Intimate Partner Violence: Not At Risk  . Fear of Current or Ex-Partner: No  . Emotionally Abused: No  . Physically Abused: No  . Sexually Abused: No    Past Surgical History:  Procedure Laterality Date  . COLONOSCOPY WITH PROPOFOL  N/A 12/17/2018   Procedure: COLONOSCOPY WITH PROPOFOL;  Surgeon: Virgel Manifold, MD;  Location: [facility] ENDOSCOPY;  Service: Gastroenterology;  Laterality: N/A;  . JOINT REPLACEMENT Bilateral   . REPLACEMENT TOTAL KNEE BILATERAL  2015  . TONSILLECTOMY  1960    Family History  Problem Relation Age of Onset  . Cancer Mother   . Hypertension Mother   . Arthritis Father   . Asthma Father   . Cancer Father   . COPD Father   . Heart attack Father   . Hypertension Sister   . Cancer Sister   . Diabetes Sister   . Asthma Son   . Birth defects Maternal Grandfather   . Arthritis Paternal Grandmother   . Diabetes Paternal Grandmother   . Arthritis Paternal Grandfather   . Asthma Sister   . Cancer Sister   . COPD Sister   . Arthritis Sister   . Asthma Sister   . Diabetes Sister   . Other Neg Hx        pituitary disorder    Allergies  Allergen Reactions  . Bupropion     Racing heart    Current Outpatient Medications on File Prior to Visit  Medication Sig Dispense Refill  . albuterol (VENTOLIN HFA) 108 (90 Base) MCG/ACT inhaler Inhale 2 puffs into the lungs every 6 (six) hours as needed for wheezing or shortness of breath. 18 g 0  . amLODipine (NORVASC) 10 MG tablet TAKE 1 TABLET BY MOUTH EVERY DAY 90 tablet 1  . aspirin EC 81 MG tablet Take by mouth.    . benzonatate (TESSALON) 100 MG capsule Take 1 capsule (100 mg total) by mouth 2 (two) times daily as needed for cough. 30 capsule 0  . blood glucose meter kit and supplies KIT Dispense based on patient and insurance preference. Use up to four times daily as directed. 1 each 0  . Coenzyme Q10 (COQ10 PO) Take 300 mg by mouth daily.    Marland Kitchen doxycycline (VIBRA-TABS) 100 MG tablet Take 1 tablet (100 mg total) by mouth 2 (two) times daily. 28 tablet 0  . esomeprazole (NEXIUM) 20 MG capsule Take 20 mg by mouth daily at 12 noon.    . fluticasone (FLONASE) 50 MCG/ACT nasal spray SPRAY 2 TIMES INTO EACH NOSTRIL DAILY AS DIRECTED    .  gabapentin (NEURONTIN) 300 MG capsule Take 1 capsule by mouth 3 (three) times daily.    Marland Kitchen glipiZIDE (GLUCOTROL) 10 MG tablet TAKE 1 TABLET (10 MG TOTAL) BY MOUTH 2 (TWO) TIMES DAILY BEFORE A MEAL. FOR DIABETES. 180 tablet 2  . HYDROcodone-homatropine (HYCODAN) 5-1.5 MG/5ML syrup Take 5 mLs by mouth every 8 (eight) hours as needed for cough. 120 mL 0  . KRILL OIL PO Take 350 mg by mouth daily.    . Lancets MISC USE UP TO 3 TIMES DAILY AS DIRECTED 100 each 2  . lisinopril (ZESTRIL) 5 MG tablet TAKE  1 TABLET BY MOUTH EVERY DAY 90 tablet 1  . metFORMIN (GLUCOPHAGE) 1000 MG tablet TAKE 1 TABLET (1,000 MG TOTAL) BY MOUTH 2 (TWO) TIMES DAILY WITH A MEAL. 180 tablet 1  . Multiple Vitamin (MULTI-VITAMINS) TABS Take 1 tablet by mouth daily.     Glory Rosebush ULTRA test strip USE UP TO 4 TIMES DAILY AS DIRECTED 100 strip 5  . pioglitazone (ACTOS) 45 MG tablet TAKE 1 TABLET BY MOUTH EVERY DAY 90 tablet 1  . pravastatin (PRAVACHOL) 40 MG tablet TAKE 1 TABLET BY MOUTH EVERY DAY IN THE EVENING FOR CHOLESTEROL 90 tablet 1  . sildenafil (VIAGRA) 50 MG tablet TAKE 1 TABLET BY MOUTH EVERY DAY AS NEEDED 12 tablet 1  . Syringe/Needle, Disp, (SYRINGE 3CC/22GX1") 22G X 1" 3 ML MISC USE AS INSTRUCTED FOR TESTOSTERONE INJECTION EVERY 2 WEEKS 10 each 1  . testosterone cypionate (DEPOTESTOSTERONE CYPIONATE) 200 MG/ML injection Inject 200 mg into the muscle every 14 (fourteen) days.    . Turmeric 500 MG TABS Take 1 tablet by mouth in the morning and at bedtime.     No current facility-administered medications on file prior to visit.    BP (!) 148/94   Pulse 85   Temp (!) 96.3 F (35.7 C) (Temporal)   Ht 5' 9.5" (1.765 m)   Wt 267 lb 4 oz (121.2 kg)   SpO2 96%   BMI 38.90 kg/m    Objective:   Physical Exam  Constitutional: He appears well-nourished.  Cardiovascular: Normal rate and regular rhythm.  Respiratory: Effort normal. No respiratory distress.  Audible wheezing during HPI, lung sounds very tight. Mild RUL  wheezing on exam. No distress, speaking in short complete sentences.   Musculoskeletal:     Cervical back: Neck supple.  Skin: Skin is warm and dry.           Assessment & Plan:

## 2020-03-26 NOTE — Patient Instructions (Addendum)
Stop by the lab prior to leaving today. I will notify you of your results once received.   Stop by the front desk and speak with either Rosaria Ferries or Charmaine regarding your CT chest.  Start prednisone 20 mg tomorrow. Take 2 tablets daily for five days.  It was a pleasure to see you today!

## 2020-03-26 NOTE — Addendum Note (Signed)
Addended by: Jacqualin Combes on: 03/26/2020 10:18 AM   Modules accepted: Orders

## 2020-03-27 ENCOUNTER — Telehealth: Payer: Self-pay | Admitting: Primary Care

## 2020-03-27 ENCOUNTER — Other Ambulatory Visit: Payer: Self-pay | Admitting: Primary Care

## 2020-03-27 DIAGNOSIS — R918 Other nonspecific abnormal finding of lung field: Secondary | ICD-10-CM

## 2020-03-27 NOTE — Telephone Encounter (Signed)
Received a call from [place] at [place]. Dr Jobie Quaker told her the patients case will be discussed at the  [facility] clinic meeting with other Dr's present on Thursday. The patient will need a Needle  Biopsy but they will discuss and decide what is best plan  for the patient.Patient may get called from Norton Blizzard the RN Nurse Navigator from [facility] or from [place]. The lung mass is unresectable per Dr Jobie Quaker.

## 2020-03-28 ENCOUNTER — Ambulatory Visit (HOSPITAL_COMMUNITY)
Admission: RE | Admit: 2020-03-28 | Discharge: 2020-03-28 | Disposition: A | Discharge status: Home / Self Care | Payer: Medicare Other | Source: Ambulatory Visit | Attending: Primary Care | Admitting: Primary Care

## 2020-03-28 ENCOUNTER — Other Ambulatory Visit: Payer: Self-pay

## 2020-03-28 DIAGNOSIS — I700 Atherosclerosis of aorta: Secondary | ICD-10-CM | POA: Insufficient documentation

## 2020-03-28 DIAGNOSIS — I2510 Atherosclerotic heart disease of native coronary artery without angina pectoris: Secondary | ICD-10-CM | POA: Diagnosis not present

## 2020-03-28 DIAGNOSIS — J90 Pleural effusion, not elsewhere classified: Secondary | ICD-10-CM | POA: Diagnosis not present

## 2020-03-28 DIAGNOSIS — R918 Other nonspecific abnormal finding of lung field: Secondary | ICD-10-CM | POA: Diagnosis not present

## 2020-03-28 DIAGNOSIS — D3501 Benign neoplasm of right adrenal gland: Secondary | ICD-10-CM | POA: Insufficient documentation

## 2020-03-28 DIAGNOSIS — N400 Enlarged prostate without lower urinary tract symptoms: Secondary | ICD-10-CM | POA: Insufficient documentation

## 2020-03-28 LAB — GLUCOSE, CAPILLARY: Glucose-Capillary: 163 mg/dL — ABNORMAL HIGH (ref 70–99)

## 2020-03-28 MED ORDER — FLUDEOXYGLUCOSE F - 18 (FDG) INJECTION
12.7000 | Freq: Once | INTRAVENOUS | Status: AC | PRN
  Administered 2020-03-28: 12.7 via INTRAVENOUS

## 2020-03-29 ENCOUNTER — Other Ambulatory Visit: Payer: Self-pay | Admitting: *Deleted

## 2020-03-29 ENCOUNTER — Telehealth: Payer: Self-pay | Admitting: *Deleted

## 2020-03-29 DIAGNOSIS — R918 Other nonspecific abnormal finding of lung field: Secondary | ICD-10-CM

## 2020-03-29 NOTE — Telephone Encounter (Signed)
Patient has an appointment with oncology on 03/30/20.

## 2020-03-29 NOTE — Progress Notes (Signed)
The proposed treatment discussed in cancer conference 03/29/20 is for discussion purpose only and is not a binding recommendation.  The patient was not physically examined nor present for their treatment options.  Therefore, final treatment plans cannot be decided.

## 2020-03-29 NOTE — Telephone Encounter (Signed)
Per Dr. Servando Snare, I called patient and scheduled him to be seen with Dr. Julien Nordmann on 03/30/20 at 10:30. Adam Wise verbalized understanding of appt time and place.

## 2020-03-30 ENCOUNTER — Encounter: Payer: Self-pay | Admitting: *Deleted

## 2020-03-30 ENCOUNTER — Inpatient Hospital Stay (HOSPITAL_BASED_OUTPATIENT_CLINIC_OR_DEPARTMENT_OTHER): Payer: Medicare Other | Admitting: Internal Medicine

## 2020-03-30 ENCOUNTER — Inpatient Hospital Stay: Payer: Medicare Other | Attending: Internal Medicine

## 2020-03-30 ENCOUNTER — Other Ambulatory Visit: Payer: Self-pay

## 2020-03-30 ENCOUNTER — Encounter (HOSPITAL_COMMUNITY): Payer: Self-pay | Admitting: Radiology

## 2020-03-30 ENCOUNTER — Encounter: Payer: Self-pay | Admitting: Internal Medicine

## 2020-03-30 VITALS — BP 150/80 | HR 100 | Temp 98.7°F | Resp 18 | Ht 69.5 in | Wt 266.0 lb

## 2020-03-30 DIAGNOSIS — Z7189 Other specified counseling: Secondary | ICD-10-CM

## 2020-03-30 DIAGNOSIS — I10 Essential (primary) hypertension: Secondary | ICD-10-CM

## 2020-03-30 DIAGNOSIS — R059 Cough, unspecified: Secondary | ICD-10-CM

## 2020-03-30 DIAGNOSIS — C770 Secondary and unspecified malignant neoplasm of lymph nodes of head, face and neck: Secondary | ICD-10-CM | POA: Insufficient documentation

## 2020-03-30 DIAGNOSIS — R918 Other nonspecific abnormal finding of lung field: Secondary | ICD-10-CM

## 2020-03-30 DIAGNOSIS — C3491 Malignant neoplasm of unspecified part of right bronchus or lung: Secondary | ICD-10-CM | POA: Diagnosis not present

## 2020-03-30 DIAGNOSIS — C787 Secondary malignant neoplasm of liver and intrahepatic bile duct: Secondary | ICD-10-CM | POA: Diagnosis not present

## 2020-03-30 DIAGNOSIS — R05 Cough: Secondary | ICD-10-CM

## 2020-03-30 DIAGNOSIS — C3490 Malignant neoplasm of unspecified part of unspecified bronchus or lung: Secondary | ICD-10-CM

## 2020-03-30 DIAGNOSIS — C3411 Malignant neoplasm of upper lobe, right bronchus or lung: Secondary | ICD-10-CM | POA: Insufficient documentation

## 2020-03-30 LAB — CBC WITH DIFFERENTIAL (CANCER CENTER ONLY)
Abs Immature Granulocytes: 0.08 10*3/uL — ABNORMAL HIGH (ref 0.00–0.07)
Basophils Absolute: 0.1 10*3/uL (ref 0.0–0.1)
Basophils Relative: 1 %
Eosinophils Absolute: 0 10*3/uL (ref 0.0–0.5)
Eosinophils Relative: 0 %
HCT: 48.1 % (ref 39.0–52.0)
Hemoglobin: 15.5 g/dL (ref 13.0–17.0)
Immature Granulocytes: 1 %
Lymphocytes Relative: 16 %
Lymphs Abs: 1.6 10*3/uL (ref 0.7–4.0)
MCH: 29.4 pg (ref 26.0–34.0)
MCHC: 32.2 g/dL (ref 30.0–36.0)
MCV: 91.1 fL (ref 80.0–100.0)
Monocytes Absolute: 0.8 10*3/uL (ref 0.1–1.0)
Monocytes Relative: 8 %
Neutro Abs: 7.7 10*3/uL (ref 1.7–7.7)
Neutrophils Relative %: 74 %
Platelet Count: 216 10*3/uL (ref 150–400)
RBC: 5.28 MIL/uL (ref 4.22–5.81)
RDW: 13.9 % (ref 11.5–15.5)
WBC Count: 10.3 10*3/uL (ref 4.0–10.5)
nRBC: 0 % (ref 0.0–0.2)

## 2020-03-30 LAB — CMP (CANCER CENTER ONLY)
ALT: 21 U/L (ref 0–44)
AST: 17 U/L (ref 15–41)
Albumin: 3.6 g/dL (ref 3.5–5.0)
Alkaline Phosphatase: 78 U/L (ref 38–126)
Anion gap: 16 — ABNORMAL HIGH (ref 5–15)
BUN: 25 mg/dL — ABNORMAL HIGH (ref 8–23)
CO2: 27 mmol/L (ref 22–32)
Calcium: 9.3 mg/dL (ref 8.9–10.3)
Chloride: 99 mmol/L (ref 98–111)
Creatinine: 1.47 mg/dL — ABNORMAL HIGH (ref 0.61–1.24)
GFR, Est AFR Am: 56 mL/min — ABNORMAL LOW (ref >60)
GFR, Estimated: 48 mL/min — ABNORMAL LOW (ref >60)
Glucose, Bld: 257 mg/dL — ABNORMAL HIGH (ref 70–99)
Potassium: 4.9 mmol/L (ref 3.5–5.1)
Sodium: 142 mmol/L (ref 135–145)
Total Bilirubin: 0.4 mg/dL (ref 0.3–1.2)
Total Protein: 6.9 g/dL (ref 6.5–8.1)

## 2020-03-30 MED ORDER — HYDROCODONE-HOMATROPINE 5-1.5 MG/5ML PO SYRP: 5.0000 mL | ORAL_SOLUTION | Freq: Three times a day (TID) | ORAL | 0 refills | Status: DC | PRN

## 2020-03-30 NOTE — Progress Notes (Signed)
Oncology Nurse Navigator Documentation  Oncology Nurse Navigator Flowsheets 03/30/2020  Diagnosis Status Additional Work Up  Navigator Follow Up Date: 04/03/2020  Navigator Follow Up Reason: Appointment Review  Navigator Location [facility]  Referral Date to RadOnc/MedOnc 03/29/2020  Navigator Encounter Type Initial MedOnc/spoke with patient today at his first visit here at the [facility]. I met his wife.  Mr. Sar has a large lung mass with no tissue dx.  He will have more work up and referral to [employer] and then follow up with Dr. Julien Nordmann.  Patient verbalized understanding of upcoming appts.   Patient Visit Type MedOnc  Treatment Phase Abnormal Scans  Barriers/Navigation Needs Coordination of Care;Education  Education Other  Interventions Education;Coordination of Care;Psycho-Social Support  Acuity Level 2-Minimal Needs (1-2 Barriers Identified)  Coordination of Care Other  Education Method Verbal  Time Spent with Patient 30

## 2020-03-30 NOTE — Progress Notes (Signed)
Adam Mask "Richardson Landry" Male, 69 y.o., 1951-06-23 MRN:  [id number] Phone:  [phone] (H) PCP:  [name], NP Primary Cvg:  Medicare/Medicare Part A And B Next Appt With Family Medicine 11/05/2020 at 9:05 AM  RE: [place] Core Biopsy (lymph Node) Received: Today Message Contents  Arne Cleveland, MD  [place], [place] D  Ok   [place] core R supraclav LAN   DDH       Previous Messages   ----- Message -----  From: Garth Bigness D  Sent: 03/30/2020 11:52 AM EDT  To: Ir Procedure Requests  Subject: [place] Core Biopsy (lymph Node)            Procedure:  [place] CORE BIOPSY (LYMPH NODES)   Reason: Non-small cell carcinoma of right lung, stage 4, Ultrasound-guided core biopsy of the right supraclavicular lymph node   History:  NM PET, CT in computer   Provider: Curt Bears   Provider Contact: [phone]

## 2020-03-30 NOTE — Progress Notes (Signed)
[place] [phone]   [phone]  CONSULT NOTE  REFERRING PHYSICIAN: Alma Friendly, NP  REASON FOR CONSULTATION:  69 years old white male with highly suspicious lung cancer.  HPI Adam Wise is a 69 y.o. male with past medical history significant for hypertension, diabetes mellitus, GERD, obstructive sleep apnea as well as testosterone deficiency and intermittent history of smoking.  The patient mentions that in early March 2021 he started having nasal congestion as well as tightness in the chest and ear aches with cough.  He was unable to see his primary care provider because of the Covid situation and he has a virtual visit with another provider and was given prescription for antibiotics for a suspicious sinus infection. The patient had no improvement and he was seen at the respiratory clinic at [facility].  He had chest x-ray performed on March 14, 2020 that showed right upper lobe consolidative process suspicious for pneumonia.  He was treated with doxycycline but has no improvement in his condition. 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  There was associated bulky mediastinal and subcarinal adenopathy and small right pleural effusion.  The findings were concerning for primary bronchogenic carcinoma at least stage IIIc (T4, N3, M0.  He has a stable right adrenal nodule suspicious for adenoma.  A PET scan was performed on 03/28/2020 and it showed 1.1 cm right anterior scalene lymph node with SUV max of 4.6.  There was also hypermetabolic mediastinal lymph nodes extend to the contralateral left mediastinum where there is a 1.6 cm low left paratracheal lymph node.  There was index low right paratracheal lymph node measuring  1.9 cm with SUV max of 7.0.  Bulky subcarinal nodal mass measuring 3.5 x 5.2 cm with SUV max of 5.9.  The border of the right hilar mass were difficult to discern giving the postobstructive collapse/consolidation in the right upper lobe and right middle lobe but has SUV max of 7.6.  The scan also showed supple hypermetabolic low-attenuation lesions in the liver with index lesion in the dome of the liver measuring 1.9 cm with SUV max of 3.5 and a second index lesion in the inferior right hepatic lobe measuring approximately 2.1 cm with SUV max of 4.5.  These are suspicious for metastatic disease. The patient was referred to me today for further evaluation and recommendation regarding management of his condition. When seen today he continues to complain of shortness of breath at baseline increased with exertion as well as cough with no chest pain or hemoptysis.  He lost around 40 pounds since January 2021.  He denied having any nausea, vomiting, diarrhea or constipation.  He denied having any headache or visual changes.  He also complains of generalized weakness. Family history significant for mother with skin cancer and hypertension, father with biliary tract cancer and sister with brain cancer. The patient is married and has 2 biologic children a son and daughter and 2 stepchildren.  He was accompanied by his wife Adam Wise.  He used to work in a [employer] and also in a [employer], trade union.  The patient has intermittent smoking history for around 20 years and he still smokes cigar.  He denied having any history of alcohol or drug abuse.  HPI  Past Medical History:  Diagnosis Date   Chickenpox    Chronic knee pain    Chronic low back pain    Essential hypertension    GERD (gastroesophageal reflux disease)    OSA (obstructive sleep apnea)    Testosterone deficiency    Type 2 diabetes mellitus ([place])     Past Surgical History:  Procedure Laterality Date   COLONOSCOPY WITH PROPOFOL N/A  12/17/2018   Procedure: COLONOSCOPY WITH PROPOFOL;  Surgeon: Virgel Manifold, MD;  Location: [facility] ENDOSCOPY;  Service: Gastroenterology;  Laterality: N/A;   JOINT REPLACEMENT Bilateral    REPLACEMENT TOTAL KNEE BILATERAL  2015   TONSILLECTOMY  1960    Family History  Problem Relation Age of Onset   Cancer Mother    Hypertension Mother    Arthritis Father    Asthma Father    Cancer Father    COPD Father    Heart attack Father    Hypertension Sister    Cancer Sister    Diabetes Sister    Asthma Son    Birth defects Maternal Grandfather    Arthritis Paternal Grandmother    Diabetes Paternal Grandmother    Arthritis Paternal Grandfather    Asthma Sister    Cancer Sister    COPD Sister    Arthritis Sister    Asthma Sister    Diabetes Sister    Other Neg Hx        pituitary disorder    Social History Social History   Tobacco Use   Smoking status: Former Smoker   Smokeless tobacco: Never Used  Substance Use Topics   Alcohol use: Yes    Comment: rarely   Drug use: Not Currently    Allergies  Allergen Reactions   Bupropion     Racing heart    Current Outpatient Medications  Medication Sig Dispense Refill   albuterol (VENTOLIN HFA) 108 (90 Base) MCG/ACT inhaler Inhale 2 puffs into the lungs every 6 (six) hours as needed for wheezing or shortness of breath. 18 g 0   amLODipine (NORVASC) 10 MG tablet TAKE 1 TABLET BY MOUTH EVERY DAY 90 tablet 1   aspirin EC 81 MG tablet Take by mouth.     benzonatate (TESSALON) 100 MG capsule Take 1 capsule (100 mg total) by mouth 2 (two) times daily as needed for cough. 30 capsule 0   blood glucose meter kit and supplies KIT Dispense based on patient and insurance preference. Use up to four times daily as directed. 1 each 0   Coenzyme Q10 (COQ10 PO) Take 300 mg by mouth daily.     esomeprazole (NEXIUM) 20 MG capsule Take 20 mg by mouth daily at 12 noon.     fluticasone (FLONASE) 50 MCG/ACT  nasal spray SPRAY 2 TIMES INTO EACH NOSTRIL DAILY AS DIRECTED     gabapentin (NEURONTIN) 300 MG capsule Take 1 capsule by mouth 3 (three) times daily.     glipiZIDE (GLUCOTROL) 10 MG tablet TAKE 1 TABLET (10 MG TOTAL) BY MOUTH 2 (TWO) TIMES DAILY BEFORE A MEAL. FOR DIABETES. 180 tablet 2   HYDROcodone-homatropine (HYCODAN) 5-1.5 MG/5ML syrup Take 5 mLs by mouth every 8 (eight) hours as needed for cough. 120 mL 0   KRILL OIL PO Take 350 mg by mouth daily.     Lancets MISC USE UP TO 3 TIMES DAILY AS DIRECTED 100 each 2   lisinopril (ZESTRIL) 5 MG tablet TAKE 1 TABLET BY MOUTH EVERY DAY 90 tablet 1   metFORMIN (GLUCOPHAGE) 1000 MG  tablet TAKE 1 TABLET (1,000 MG TOTAL) BY MOUTH 2 (TWO) TIMES DAILY WITH A MEAL. 180 tablet 1   Multiple Vitamin (MULTI-VITAMINS) TABS Take 1 tablet by mouth daily.      ONETOUCH ULTRA test strip USE UP TO 4 TIMES DAILY AS DIRECTED 100 strip 5   pioglitazone (ACTOS) 45 MG tablet TAKE 1 TABLET BY MOUTH EVERY DAY 90 tablet 1   pravastatin (PRAVACHOL) 40 MG tablet TAKE 1 TABLET BY MOUTH EVERY DAY IN THE EVENING FOR CHOLESTEROL 90 tablet 1   predniSONE (DELTASONE) 20 MG tablet Take 2 tablets daily for five days. 10 tablet 0   sildenafil (VIAGRA) 50 MG tablet TAKE 1 TABLET BY MOUTH EVERY DAY AS NEEDED 12 tablet 1   Syringe/Needle, Disp, (SYRINGE 3CC/22GX1") 22G X 1" 3 ML MISC USE AS INSTRUCTED FOR TESTOSTERONE INJECTION EVERY 2 WEEKS 10 each 1   testosterone cypionate (DEPOTESTOSTERONE CYPIONATE) 200 MG/ML injection Inject 200 mg into the muscle every 14 (fourteen) days.     Turmeric 500 MG TABS Take 1 tablet by mouth in the morning and at bedtime.     doxycycline (VIBRA-TABS) 100 MG tablet Take 1 tablet (100 mg total) by mouth 2 (two) times daily. (Patient not taking: Reported on 03/30/2020) 28 tablet 0   No current facility-administered medications for this visit.    Review of Systems  Constitutional: positive for fatigue and weight loss Eyes:  negative Ears, nose, mouth, throat, and face: negative Respiratory: positive for cough, dyspnea on exertion and wheezing Cardiovascular: negative Gastrointestinal: negative Genitourinary:negative Integument/breast: negative Hematologic/lymphatic: negative Musculoskeletal:negative Neurological: negative Behavioral/Psych: negative Endocrine: negative Allergic/Immunologic: negative  Physical Exam  SLH:TDSKA, healthy, no distress, well nourished, well developed and anxious SKIN: skin color, texture, turgor are normal, no rashes or significant lesions HEAD: Normocephalic, No masses, lesions, tenderness or abnormalities EYES: normal, PERRLA, Conjunctiva are pink and non-injected EARS: External ears normal, Canals clear OROPHARYNX:no exudate, no erythema and lips, buccal mucosa, and tongue normal  NECK: supple, no adenopathy, no JVD LYMPH:  no palpable lymphadenopathy, no hepatosplenomegaly LUNGS: prolonged expiratory phase, scattered rales bilaterally HEART: regular rate & rhythm, no murmurs and no gallops ABDOMEN:abdomen soft, non-tender, obese, normal bowel sounds and no masses or organomegaly BACK: Back symmetric, no curvature., No CVA tenderness EXTREMITIES:no joint deformities, effusion, or inflammation, no edema  NEURO: alert & oriented x 3 with fluent speech, no focal motor/sensory deficits  PERFORMANCE STATUS: ECOG 1  LABORATORY DATA: Lab Results  Component Value Date   WBC 10.3 03/30/2020   HGB 15.5 03/30/2020   HCT 48.1 03/30/2020   MCV 91.1 03/30/2020   PLT 216 03/30/2020      Chemistry      Component Value Date/Time   NA 137 03/26/2020 0936   K 5.7 slight hemolysis (H) 03/26/2020 0936   CL 99 03/26/2020 0936   CO2 32 03/26/2020 0936   BUN 15 03/26/2020 0936   CREATININE 1.26 (H) 03/26/2020 1123      Component Value Date/Time   CALCIUM 9.8 03/26/2020 0936   ALKPHOS 62 11/01/2019 0846   AST 23 11/01/2019 0846   ALT 22 11/01/2019 0846   BILITOT 0.3  11/01/2019 0846       RADIOGRAPHIC STUDIES: CT Chest W Contrast  Result Date: 03/26/2020 CLINICAL DATA:  Persistent cough, congestion wheezing and shortness of breath. Negative COVID test. Two rounds of antibiotics without improvement. Consolidation on chest x-ray. EXAM: CT CHEST WITH CONTRAST TECHNIQUE: Multidetector CT imaging of the chest was performed during intravenous contrast administration.  CONTRAST:  43m OMNIPAQUE IOHEXOL 300 MG/ML  SOLN COMPARISON:  Chest radiograph 03/14/2020. FINDINGS: Cardiovascular: Atherosclerotic calcification of the aorta and coronary arteries. Pulmonic trunk is enlarged. Right pulmonary artery branches are encased and narrowed. There is narrowing of the SVC by an adjacent mass. Heart size normal. No pericardial effusion. Mediastinum/Nodes: Low right internal jugular and anterior scalene lymph nodes measure up to 1.5 cm (2/7). Bulky mediastinal adenopathy. Index low right paratracheal mass measures approximately 2.9 x 4.3 cm. Subcarinal nodal mass measures 3.5 x 5.1 cm. Low left paratracheal lymph node measures 1.4 cm and a high AP window lymph node measures 7 mm, indicating contralateral mediastinal involvement. There is a right hilar mass/adenopathy encasing the right pulmonary artery branches and obstructing the right upper and middle lobe bronchi. Postobstructive collapse/consolidation in the right upper and right middle lobes makes measurement difficult. Septal thickening and nodularity are seen in the right upper lobe is well. No axillary adenopathy. Prepericardiac lymph nodes measure up to 1.6 cm. Esophagus is grossly unremarkable. Lungs/Pleura: Right upper lobe/right hilar mass obstructs the right upper and middle lobe bronchi. There is postobstructive collapse/consolidation in the right upper lobe with septal thickening and nodularity. Complete collapse/consolidation involving the right middle lobe. Mass also encases and narrows the right pulmonary artery branches  and severely narrows bronchus intermedius. Small right pleural effusion. 2 mm left upper lobe nodule (3/53) unchanged and benign. Minimal subpleural scarring in the left lower lobe. No left pleural fluid. Upper Abdomen: Visualized portion of the liver is unremarkable. 1.7 cm right adrenal nodule measures 31 Hounsfield units but is stable from 09/25/2017. Visualized portion of the left adrenal gland is unremarkable. Exophytic low-attenuation lesion off the upper pole right kidney measures at least 3.2 cm, partially imaged. Visualized portions of the kidneys, spleen, pancreas, stomach and bowel are otherwise unremarkable. Musculoskeletal: Degenerative changes in the spine. No worrisome lytic or sclerotic lesions. IMPRESSION: 1. 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 Associated bulky mediastinal and subcarinal adenopathy and small right pleural effusion. Findings are indicative of primary bronchogenic carcinoma and at least T4 N3 M0 or stage IIIC disease. If the right pleural effusion is malignant, findings are consistent with stage IV lung cancer. 2. Right adrenal nodule is stable and therefore indicative of an adenoma. 3. Aortic atherosclerosis (ICD10-I70.0). Coronary artery calcification. 4. Enlarged pulmonic trunk with encasement and narrowing of the right upper lobe pulmonary arterial branches. Electronically Signed   By: MLorin PicketM.D.   On: 03/26/2020 12:24   NM PET Image Initial (PI) Skull Base To Thigh  Result Date: 03/28/2020 CLINICAL DATA:  Initial treatment strategy for right lung mass. EXAM: NUCLEAR MEDICINE PET SKULL BASE TO THIGH TECHNIQUE: 12.7 mCi F-18 FDG was injected intravenously. Full-ring PET imaging was performed from the skull base to thigh after the radiotracer. CT data was obtained and used for attenuation correction and anatomic  localization. Fasting blood glucose: 163 mg/dl COMPARISON:  CT chest 03/26/2020 and 09/25/2017. FINDINGS: Mediastinal blood pool activity: SUV max 3.1 Liver activity: SUV max NA NECK: No abnormal hypermetabolism. Incidental CT findings: None. CHEST: Hypermetabolic 11 mm right anterior scalene lymph node has an SUV max of 4.6. Hypermetabolic mediastinal lymph nodes extend to the contralateral left mediastinum where there is a 1.6 cm low left paratracheal lymph node (4/75) with an SUV 4.0. Index low right paratracheal lymph node measures 1.9 cm (4/68) with an SUV max  of 7.0. Bulky subcarinal nodal mass measures 3.5 x 5.2 cm with an SUV max of 5.9. The borders of a right hilar mass are difficult to discern given postobstructive collapse/consolidation in the right upper and right middle lobes but, has an SUV max of 7.6. No hypermetabolic left hilar or axillary adenopathy. Incidental CT findings: Atherosclerotic calcification of the aorta and coronary arteries. Heart is at the upper limits of normal in size to mildly enlarged. No pericardial effusion. Trace right pleural effusion, decreased. As discussed on 03/26/2020, there is complete obstruction of the right upper and right middle lobe bronchi with postobstructive collapse/consolidation. Nodular consolidation, ground-glass and septal thickening are seen in the peripheral right upper lobe. ABDOMEN/PELVIS: There are subtle hypermetabolic low attenuation lesions in the liver. Index lesion in the dome of the liver measures approximately 1.9 cm (4/97) with an SUV max of 3.5. A second index lesion in the inferior right hepatic lobe measures approximately 2.1 cm (4/126) with an SUV max of 4.5. There are additional suspicious hypoattenuating lesions in the liver bilaterally, many without definitive associated hypermetabolism. No abnormal hypermetabolism in adrenal glands, spleen or pancreas. No hypermetabolic lymph nodes. Incidental CT findings: Liver and gallbladder are  otherwise unremarkable. Fluid density nodule in the left adrenal gland measures 2.1 cm. Left adrenal gland is unremarkable. Low-attenuation lesions in the right kidney measure up to 6.3 cm and are likely cysts. Left kidney, spleen, pancreas and stomach are unremarkable. Duodenal diverticulum is incidentally noted. Small bowel and colon are otherwise grossly unremarkable. Prostate is mildly enlarged. SKELETON: Mild hypermetabolism overlying the greater trochanter of the left femur is likely inflammatory in etiology, as can be seen with bursitis. Otherwise, no additional abnormal hypermetabolism. Incidental CT findings: Degenerative changes in the spine. IMPRESSION: 1. Hypermetabolic obstructing right perihilar mass with hypermetabolic lymph nodes extending to the contralateral mediastinum and right anterior scalene nodal stations. Hypoattenuating lesions in the liver, some of which show abnormal hypermetabolism. 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 2. Nodular consolidation, ground-glass and septal thickening in the peripheral right upper lobe, indicative of lymphangitic carcinomatosis. 3. Trace right pleural effusion, decreased from 2 days prior. 4. Right adrenal adenoma. 5. Mildly enlarged prostate. 6. Aortic atherosclerosis (ICD10-I70.0). Coronary artery calcification. Electronically Signed   By: Lorin Picket M.D.   On: 03/28/2020 09:11   DG Chest Portable 2 Views  Result Date: 03/14/2020 CLINICAL DATA:  Cough and short of breath EXAM: CHEST  2 VIEW PORTABLE COMPARISON:  CT 09/25/2017 FINDINGS: Right upper lobe consolidative process. No pleural effusion. Borderline cardiomegaly. No pneumothorax. IMPRESSION: Right upper lobe consolidative process, may reflect pneumonia in the appropriate clinical setting. Radiographic follow-up to resolution is advised as potential lung mass could also produce this appearance. If more urgent imaging is desired or clinical symptoms are not  consistent with pneumonia, contrast-enhanced chest CT could be obtained for further evaluation. Electronically Signed   By: Donavan Foil M.D.   On: 03/14/2020 18:45    ASSESSMENT: This is a very pleasant 69 years old white male with highly suspicious stage IV (T4, N3, M1c) lung cancer pending further staging work-up as well as tissue diagnosis.  He presented with large right upper lobe/right hilar mass with ipsilateral and contralateral mediastinal and right supraclavicular lymphadenopathy in addition to suspicious liver lesios   PLAN: I had a lengthy discussion with the patient and his wife today about his current condition and further investigation to confirm his diagnosis as well as potential treatment options. I personally and  independently reviewed the scan images and discussed the result and showed the images to the patient and his wife. I recommended for the patient to have MRI of the brain as well as MRI of the liver to complete the staging work-up and identifies the lesion of the livers that were suspicious on the PET scan. I will also refer the patient to interventional radiology for consideration of ultrasound-guided core biopsy of the left supraclavicular lymph node. I will refer the patient to radiation oncology for consideration of palliative radiotherapy to the obstructive right upper lobe lung mass. I will see the patient back for follow-up visit in less than 2 weeks for reevaluation and more detailed discussion of his systemic treatment options based on the final pathology and staging work-up. I strongly encouraged the patient to quit smoking. For the cough, I will give him refill of Hycodan. He was advised to call immediately if he has any concerning symptoms in the interval.  The patient voices understanding of current disease status and treatment options and is in agreement with the current care plan.  All questions were answered. The patient knows to call the clinic with any  problems, questions or concerns. We can certainly see the patient much sooner if necessary.  Thank you so much for allowing me to participate in the care of Adam Wise. I will continue to follow up the patient with you and assist in his care.  The total time spent in the appointment was 60 minutes.  Disclaimer: This note was dictated with voice recognition software. Similar sounding words can inadvertently be transcribed and may not be corrected upon review.   Eilleen Kempf March 30, 2020, 10:28 AM

## 2020-04-03 ENCOUNTER — Telehealth: Payer: Self-pay | Admitting: Internal Medicine

## 2020-04-03 ENCOUNTER — Ambulatory Visit (HOSPITAL_COMMUNITY)
Admission: RE | Admit: 2020-04-03 | Discharge: 2020-04-03 | Disposition: A | Discharge status: Home / Self Care | Payer: Medicare Other | Source: Ambulatory Visit | Attending: Internal Medicine | Admitting: Internal Medicine

## 2020-04-03 ENCOUNTER — Other Ambulatory Visit: Payer: Self-pay

## 2020-04-03 DIAGNOSIS — C3490 Malignant neoplasm of unspecified part of unspecified bronchus or lung: Secondary | ICD-10-CM | POA: Insufficient documentation

## 2020-04-03 MED ORDER — GADOBUTROL 1 MMOL/ML IV SOLN
10.0000 mL | Freq: Once | INTRAVENOUS | Status: AC | PRN
  Administered 2020-04-03: 10 mL via INTRAVENOUS

## 2020-04-03 NOTE — Progress Notes (Signed)
Thoracic Location of Tumor / Histology: Right Upper Lobe, Right Hilar  Patient presented with nasal congestion as well as tightness in the chest and ear aches with cough.  He was given a prescription for antibiotics for a suspicious sinus infection.  He had no improvement and he was seen at the respiratory clinic.  Chest x ray performed.  MRI Brain 04/03/2020:   PET 03/28/2020: 1.1 cm right anterior scalene lymph node.  There was also hypermetabolic mediastinal lymph nodes extend to the contralateral left mediastinum where there is a 1.6 cm low left paratracheal lymph node.  There was index low right paratracheal lymph node measuring 1.9 cm.  Bulky subcarinal nodal mass measuring 3.5 x 5.2 cm.  The border of the right hilar mass were difficult to discern giving the postobstructive collapse/consolidation in the right upper lobe and right middle lobe.  The scan also showed supple hypermetabolic low-attenuation lesions in the liver with index lesion in the dome of the liver measuring 1.9 cm and a second index lesion in the inferior right hepatic lobe measuring approximately 2.1 cm.  These are suspicious for metastatic disease.  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  There was associated bulky mediastinal and subcarinal adenopathy and small right pleural effusion.  The findings were concerning for primary bronchogenic carcinoma at least stage IIIc.  He has a stable right adrenal nodule suspicious for adenoma.  Chest xray 03/14/2020: Right upper lobe consolidative process suspicious for pneumonia.  Biopsies of Lymph Nodes 04/09/2020  Tobacco/Marijuana/Snuff/ETOH use: Current Smoker  Past/Anticipated interventions by cardiothoracic surgery, if any:  Mass is unresectable per Dr. Servando Snare   Past/Anticipated interventions by medical  oncology, if any:  Dr. Julien Nordmann 03/30/2020 -I recommended for the patient to have MRI of the brain as well as MRI of the liver to complete the staging work-up and identifies the lesion of the livers that were suspicious on the PET scan. -I will also refer the patient to interventional radiology for consideration of ultrasound-guided core biopsy of the left supraclavicular lymph node. -I will refer the patient to radiation oncology for consideration of palliative radiotherapy to the obstructive right upper lobe lung mass. -I will see the patient back for follow-up visit in less than 2 weeks for reevaluation and more detailed discussion of his systemic treatment options based on the final pathology and staging work-up.   Signs/Symptoms  Weight changes, if any: Lost about 40 pounds since December 2020.  He states that he had changed his diet during that time as well.  Respiratory complaints, if any: SOB, he states it is most of the time, it limits what he can do.  Hemoptysis, if any: Occasional productive cough with clear sputum, no blood noted.    Pain issues, if any: Has chest pain when he coughs to much.   SAFETY ISSUES:  Prior radiation? No  Pacemaker/ICD? No  Possible current pregnancy? N/a  Is the patient on methotrexate? No  Current Complaints / other details:

## 2020-04-03 NOTE — Telephone Encounter (Signed)
Scheduled per los. Called and spoke with patient. Confirmed appt 

## 2020-04-04 ENCOUNTER — Other Ambulatory Visit: Payer: Self-pay

## 2020-04-04 ENCOUNTER — Telehealth: Payer: Self-pay | Admitting: Radiation Oncology

## 2020-04-04 ENCOUNTER — Ambulatory Visit
Admission: RE | Admit: 2020-04-04 | Discharge: 2020-04-04 | Disposition: A | Discharge status: Home / Self Care | Payer: Medicare Other | Source: Ambulatory Visit | Attending: Radiation Oncology | Admitting: Radiation Oncology

## 2020-04-04 ENCOUNTER — Encounter: Payer: Self-pay | Admitting: Radiation Oncology

## 2020-04-04 ENCOUNTER — Telehealth: Payer: Medicare Other | Admitting: Radiation Oncology

## 2020-04-04 DIAGNOSIS — C3411 Malignant neoplasm of upper lobe, right bronchus or lung: Secondary | ICD-10-CM

## 2020-04-04 DIAGNOSIS — Z510 Encounter for antineoplastic radiation therapy: Secondary | ICD-10-CM | POA: Insufficient documentation

## 2020-04-04 DIAGNOSIS — E118 Type 2 diabetes mellitus with unspecified complications: Secondary | ICD-10-CM

## 2020-04-04 DIAGNOSIS — E230 Hypopituitarism: Secondary | ICD-10-CM

## 2020-04-04 HISTORY — DX: Malignant neoplasm of upper lobe, right bronchus or lung: C34.11

## 2020-04-04 NOTE — Progress Notes (Signed)
Radiation Oncology         [phone] ________________________________  Name: Adam Wise        MRN: [id number]  Date of Service: 04/04/2020 DOB: May 30, 1951  OM:[facility], [name], NP  Curt Bears, MD     REFERRING PHYSICIAN: Curt Bears, MD   DIAGNOSIS: The encounter diagnosis was Malignant neoplasm of upper lobe of right lung [facility]).   HISTORY OF PRESENT ILLNESS: Adam Wise is a 69 y.o. male seen at the request of Dr. Julien Nordmann for a probable stage IV lung cancer. The patient presented with symptoms of cough and shortness of breath an on 03/14/20 a CXR revealed a RUL consolidative process. He proceeded with CT scan on 03/26/20 that revealed a RUL/right hilar mass obstructing the upper and middle lobe bronchi with postobstructive collapse of the RUL and septal thickening and nodularity, that also causes complete collapse of the RML, encasing and narrowing the right pulmonary artery branches and the bronchus intermedius with a small right effusion, and a 2 mm LUL nodule. In the nodal regions tehre was adenopathy up to 15 mm in the right internal jugular and anterior scalene nodes, and a right paratracheal mass measuring 2.9 x 4.3 cm, subcarinal nodal mass measuring 3.5 x 5.1 cm, and low left paratracheal node measuring 1.4 cm, and a high AP window node measuring 7 mm, with right hilar mass/adenopathy also encasing the right pulmonary artery branches and obstructing the RUL and RML bronchi, and pericardial adenopathy up to 1.6 cm was noted as well. PET scan on 03/28/20 which showed hypermetabolism in the scale nodes SUV 4.6, mediastinal and left paratracheal nodes with an SUV of 4, right paratracheal nodes with SUV of 7, subcarinal with SUV of 5.9, and borders of the right upper and middle lobes with an SUV of 7.6. he also has subtle hypermetabolic low attenuation lesion in the liver at the dome measuring 1.9 cm with SUV of 3.5, the right hepatic lobe had a 2.1 cm lesion with SUV of 4.5, and  no other hypermetabolic findings were noted other than mild hypermetabolic activity in the left hip overlying the greater trochanter of the left femur felt to be inflammatory in nature. He had a brain MRI yesterday that has not been read, but he's scheduled to undergo core biopsy of nodes at the jugular/supraclavicular site on 04/09/20. He's seen today to discuss palliative radiotherapy to the chest.     PREVIOUS RADIATION THERAPY: No   PAST MEDICAL HISTORY:  Past Medical History:  Diagnosis Date  . Chickenpox   . Chronic knee pain   . Chronic low back pain   . Essential hypertension   . GERD (gastroesophageal reflux disease)   . OSA (obstructive sleep apnea)   . Testosterone deficiency   . Type 2 diabetes mellitus ([place])        PAST SURGICAL HISTORY: Past Surgical History:  Procedure Laterality Date  . COLONOSCOPY WITH PROPOFOL N/A 12/17/2018   Procedure: COLONOSCOPY WITH PROPOFOL;  Surgeon: Virgel Manifold, MD;  Location: [facility] ENDOSCOPY;  Service: Gastroenterology;  Laterality: N/A;  . JOINT REPLACEMENT Bilateral   . REPLACEMENT TOTAL KNEE BILATERAL  2015  . TONSILLECTOMY  1960     FAMILY HISTORY:  Family History  Problem Relation Age of Onset  . Cancer Mother   . Hypertension Mother   . Arthritis Father   . Asthma Father   . Cancer Father   . COPD Father   . Heart attack Father   . Hypertension Sister   .  Cancer Sister   . Diabetes Sister   . Asthma Son   . Birth defects Maternal Grandfather   . Arthritis Paternal Grandmother   . Diabetes Paternal Grandmother   . Arthritis Paternal Grandfather   . Asthma Sister   . Cancer Sister   . COPD Sister   . Arthritis Sister   . Asthma Sister   . Diabetes Sister   . Other Neg Hx        pituitary disorder     SOCIAL HISTORY:  reports that he has quit smoking. He has never used smokeless tobacco. He reports current alcohol use. He reports previous drug use.   ALLERGIES: Bupropion   MEDICATIONS:  Current  Outpatient Medications  Medication Sig Dispense Refill  . albuterol (VENTOLIN HFA) 108 (90 Base) MCG/ACT inhaler Inhale 2 puffs into the lungs every 6 (six) hours as needed for wheezing or shortness of breath. 18 g 0  . amLODipine (NORVASC) 10 MG tablet TAKE 1 TABLET BY MOUTH EVERY DAY 90 tablet 1  . aspirin EC 81 MG tablet Take by mouth.    . benzonatate (TESSALON) 100 MG capsule Take 1 capsule (100 mg total) by mouth 2 (two) times daily as needed for cough. 30 capsule 0  . blood glucose meter kit and supplies KIT Dispense based on patient and insurance preference. Use up to four times daily as directed. 1 each 0  . Coenzyme Q10 (COQ10 PO) Take 300 mg by mouth daily.    Marland Kitchen doxycycline (VIBRA-TABS) 100 MG tablet Take 1 tablet (100 mg total) by mouth 2 (two) times daily. (Patient not taking: Reported on 03/30/2020) 28 tablet 0  . esomeprazole (NEXIUM) 20 MG capsule Take 20 mg by mouth daily at 12 noon.    . fluticasone (FLONASE) 50 MCG/ACT nasal spray SPRAY 2 TIMES INTO EACH NOSTRIL DAILY AS DIRECTED    . gabapentin (NEURONTIN) 300 MG capsule Take 1 capsule by mouth 3 (three) times daily.    Marland Kitchen glipiZIDE (GLUCOTROL) 10 MG tablet TAKE 1 TABLET (10 MG TOTAL) BY MOUTH 2 (TWO) TIMES DAILY BEFORE A MEAL. FOR DIABETES. 180 tablet 2  . HYDROcodone-homatropine (HYCODAN) 5-1.5 MG/5ML syrup Take 5 mLs by mouth every 8 (eight) hours as needed for cough. 120 mL 0  . KRILL OIL PO Take 350 mg by mouth daily.    . Lancets MISC USE UP TO 3 TIMES DAILY AS DIRECTED 100 each 2  . lisinopril (ZESTRIL) 5 MG tablet TAKE 1 TABLET BY MOUTH EVERY DAY 90 tablet 1  . metFORMIN (GLUCOPHAGE) 1000 MG tablet TAKE 1 TABLET (1,000 MG TOTAL) BY MOUTH 2 (TWO) TIMES DAILY WITH A MEAL. 180 tablet 1  . Multiple Vitamin (MULTI-VITAMINS) TABS Take 1 tablet by mouth daily.     Glory Rosebush ULTRA test strip USE UP TO 4 TIMES DAILY AS DIRECTED 100 strip 5  . pioglitazone (ACTOS) 45 MG tablet TAKE 1 TABLET BY MOUTH EVERY DAY 90 tablet 1  .  pravastatin (PRAVACHOL) 40 MG tablet TAKE 1 TABLET BY MOUTH EVERY DAY IN THE EVENING FOR CHOLESTEROL 90 tablet 1  . predniSONE (DELTASONE) 20 MG tablet Take 2 tablets daily for five days. 10 tablet 0  . sildenafil (VIAGRA) 50 MG tablet TAKE 1 TABLET BY MOUTH EVERY DAY AS NEEDED 12 tablet 1  . Syringe/Needle, Disp, (SYRINGE 3CC/22GX1") 22G X 1" 3 ML MISC USE AS INSTRUCTED FOR TESTOSTERONE INJECTION EVERY 2 WEEKS 10 each 1  . testosterone cypionate (DEPOTESTOSTERONE CYPIONATE) 200 MG/ML injection Inject  200 mg into the muscle every 14 (fourteen) days.    . Turmeric 500 MG TABS Take 1 tablet by mouth in the morning and at bedtime.     No current facility-administered medications for this encounter.     REVIEW OF SYSTEMS: On review of systems, the patient reports that he is doing well overall. He does have shortness of breath without hoarseness or hemoptysis. He denies any chest pain,  fevers, chills, night sweats, unintended weight changes. He denies any bowel or bladder disturbances, and denies abdominal pain, nausea or vomiting. He denies any new musculoskeletal or joint aches or pains. A complete review of systems is obtained and is otherwise negative.     PHYSICAL EXAM:  Wt Readings from Last 3 Encounters:  03/30/20 266 lb (120.7 kg)  03/26/20 267 lb 4 oz (121.2 kg)  03/14/20 270 lb (122.5 kg)   In general this is a well appearing caucasian male in no acute distress. He's alert and oriented x4 and appropriate throughout the examination. Cardiopulmonary assessment is negative for acute distress and he exhibits normal effort.    ECOG = 1  0 - Asymptomatic (Fully active, able to carry on all predisease activities without restriction)  1 - Symptomatic but completely ambulatory (Restricted in physically strenuous activity but ambulatory and able to carry out work of a light or sedentary nature. For example, light housework, office work)  2 - Symptomatic, <50% in bed during the day  (Ambulatory and capable of all self care but unable to carry out any work activities. Up and about more than 50% of waking hours)  3 - Symptomatic, >50% in bed, but not bedbound (Capable of only limited self-care, confined to bed or chair 50% or more of waking hours)  4 - Bedbound (Completely disabled. Cannot carry on any self-care. Totally confined to bed or chair)  5 - Death   Eustace Pen MM, Creech RH, Tormey DC, et al. [phone]). "Toxicity and response criteria of the [facility]". [place] Oncol. 5 (6): 649-55    LABORATORY DATA:  Lab Results  Component Value Date   WBC 10.3 03/30/2020   HGB 15.5 03/30/2020   HCT 48.1 03/30/2020   MCV 91.1 03/30/2020   PLT 216 03/30/2020   Lab Results  Component Value Date   NA 142 03/30/2020   K 4.9 03/30/2020   CL 99 03/30/2020   CO2 27 03/30/2020   Lab Results  Component Value Date   ALT 21 03/30/2020   AST 17 03/30/2020   ALKPHOS 78 03/30/2020   BILITOT 0.4 03/30/2020      RADIOGRAPHY: CT Chest W Contrast  Result Date: 03/26/2020 CLINICAL DATA:  Persistent cough, congestion wheezing and shortness of breath. Negative COVID test. Two rounds of antibiotics without improvement. Consolidation on chest x-ray. EXAM: CT CHEST WITH CONTRAST TECHNIQUE: Multidetector CT imaging of the chest was performed during intravenous contrast administration. CONTRAST:  38m OMNIPAQUE IOHEXOL 300 MG/ML  SOLN COMPARISON:  Chest radiograph 03/14/2020. FINDINGS: Cardiovascular: Atherosclerotic calcification of the aorta and coronary arteries. Pulmonic trunk is enlarged. Right pulmonary artery branches are encased and narrowed. There is narrowing of the SVC by an adjacent mass. Heart size normal. No pericardial effusion. Mediastinum/Nodes: Low right internal jugular and anterior scalene lymph nodes measure up to 1.5 cm (2/7). Bulky mediastinal adenopathy. Index low right paratracheal mass measures approximately 2.9 x 4.3 cm. Subcarinal nodal  mass measures 3.5 x 5.1 cm. Low left paratracheal lymph node measures 1.4 cm and a  high AP window lymph node measures 7 mm, indicating contralateral mediastinal involvement. There is a right hilar mass/adenopathy encasing the right pulmonary artery branches and obstructing the right upper and middle lobe bronchi. Postobstructive collapse/consolidation in the right upper and right middle lobes makes measurement difficult. Septal thickening and nodularity are seen in the right upper lobe is well. No axillary adenopathy. Prepericardiac lymph nodes measure up to 1.6 cm. Esophagus is grossly unremarkable. Lungs/Pleura: Right upper lobe/right hilar mass obstructs the right upper and middle lobe bronchi. There is postobstructive collapse/consolidation in the right upper lobe with septal thickening and nodularity. Complete collapse/consolidation involving the right middle lobe. Mass also encases and narrows the right pulmonary artery branches and severely narrows bronchus intermedius. Small right pleural effusion. 2 mm left upper lobe nodule (3/53) unchanged and benign. Minimal subpleural scarring in the left lower lobe. No left pleural fluid. Upper Abdomen: Visualized portion of the liver is unremarkable. 1.7 cm right adrenal nodule measures 31 Hounsfield units but is stable from 09/25/2017. Visualized portion of the left adrenal gland is unremarkable. Exophytic low-attenuation lesion off the upper pole right kidney measures at least 3.2 cm, partially imaged. Visualized portions of the kidneys, spleen, pancreas, stomach and bowel are otherwise unremarkable. Musculoskeletal: Degenerative changes in the spine. No worrisome lytic or sclerotic lesions. IMPRESSION: 1. 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 Associated bulky mediastinal and subcarinal adenopathy and small right  pleural effusion. Findings are indicative of primary bronchogenic carcinoma and at least T4 N3 M0 or stage IIIC disease. If the right pleural effusion is malignant, findings are consistent with stage IV lung cancer. 2. Right adrenal nodule is stable and therefore indicative of an adenoma. 3. Aortic atherosclerosis (ICD10-I70.0). Coronary artery calcification. 4. Enlarged pulmonic trunk with encasement and narrowing of the right upper lobe pulmonary arterial branches. Electronically Signed   By: Lorin Picket M.D.   On: 03/26/2020 12:24   NM PET Image Initial (PI) Skull Base To Thigh  Result Date: 03/28/2020 CLINICAL DATA:  Initial treatment strategy for right lung mass. EXAM: NUCLEAR MEDICINE PET SKULL BASE TO THIGH TECHNIQUE: 12.7 mCi F-18 FDG was injected intravenously. Full-ring PET imaging was performed from the skull base to thigh after the radiotracer. CT data was obtained and used for attenuation correction and anatomic localization. Fasting blood glucose: 163 mg/dl COMPARISON:  CT chest 03/26/2020 and 09/25/2017. FINDINGS: Mediastinal blood pool activity: SUV max 3.1 Liver activity: SUV max NA NECK: No abnormal hypermetabolism. Incidental CT findings: None. CHEST: Hypermetabolic 11 mm right anterior scalene lymph node has an SUV max of 4.6. Hypermetabolic mediastinal lymph nodes extend to the contralateral left mediastinum where there is a 1.6 cm low left paratracheal lymph node (4/75) with an SUV 4.0. Index low right paratracheal lymph node measures 1.9 cm (4/68) with an SUV max of 7.0. Bulky subcarinal nodal mass measures 3.5 x 5.2 cm with an SUV max of 5.9. The borders of a right hilar mass are difficult to discern given postobstructive collapse/consolidation in the right upper and right middle lobes but, has an SUV max of 7.6. No hypermetabolic left hilar or axillary adenopathy. Incidental CT findings: Atherosclerotic calcification of the aorta and coronary arteries. Heart is at the upper limits of  normal in size to mildly enlarged. No pericardial effusion. Trace right pleural effusion, decreased. As discussed on 03/26/2020, there is complete obstruction of the right upper and right middle  lobe bronchi with postobstructive collapse/consolidation. Nodular consolidation, ground-glass and septal thickening are seen in the peripheral right upper lobe. ABDOMEN/PELVIS: There are subtle hypermetabolic low attenuation lesions in the liver. Index lesion in the dome of the liver measures approximately 1.9 cm (4/97) with an SUV max of 3.5. A second index lesion in the inferior right hepatic lobe measures approximately 2.1 cm (4/126) with an SUV max of 4.5. There are additional suspicious hypoattenuating lesions in the liver bilaterally, many without definitive associated hypermetabolism. No abnormal hypermetabolism in adrenal glands, spleen or pancreas. No hypermetabolic lymph nodes. Incidental CT findings: Liver and gallbladder are otherwise unremarkable. Fluid density nodule in the left adrenal gland measures 2.1 cm. Left adrenal gland is unremarkable. Low-attenuation lesions in the right kidney measure up to 6.3 cm and are likely cysts. Left kidney, spleen, pancreas and stomach are unremarkable. Duodenal diverticulum is incidentally noted. Small bowel and colon are otherwise grossly unremarkable. Prostate is mildly enlarged. SKELETON: Mild hypermetabolism overlying the greater trochanter of the left femur is likely inflammatory in etiology, as can be seen with bursitis. Otherwise, no additional abnormal hypermetabolism. Incidental CT findings: Degenerative changes in the spine. IMPRESSION: 1. Hypermetabolic obstructing right perihilar mass with hypermetabolic lymph nodes extending to the contralateral mediastinum and right anterior scalene nodal stations. Hypoattenuating lesions in the liver, some of which show abnormal hypermetabolism. 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 2.  Nodular consolidation, ground-glass and septal thickening in the peripheral right upper lobe, indicative of lymphangitic carcinomatosis. 3. Trace right pleural effusion, decreased from 2 days prior. 4. Right adrenal adenoma. 5. Mildly enlarged prostate. 6. Aortic atherosclerosis (ICD10-I70.0). Coronary artery calcification. Electronically Signed   By: Lorin Picket M.D.   On: 03/28/2020 09:11   DG Chest Portable 2 Views  Result Date: 03/14/2020 CLINICAL DATA:  Cough and short of breath EXAM: CHEST  2 VIEW PORTABLE COMPARISON:  CT 09/25/2017 FINDINGS: Right upper lobe consolidative process. No pleural effusion. Borderline cardiomegaly. No pneumothorax. IMPRESSION: Right upper lobe consolidative process, may reflect pneumonia in the appropriate clinical setting. Radiographic follow-up to resolution is advised as potential lung mass could also produce this appearance. If more urgent imaging is desired or clinical symptoms are not consistent with pneumonia, contrast-enhanced chest CT could be obtained for further evaluation. Electronically Signed   By: Donavan Foil M.D.   On: 03/14/2020 18:45       IMPRESSION/PLAN: 1. Probable Stage IV NSCLC of the right hilum involving the RUL and RML. Dr. Lisbeth Renshaw discusses the pathology findings and reviews the nature of advanced lung cancer. We will follow up with his biopsy results, and further liver work up as well. We reviewed that in situations of stage iv disease, patients would benefit from palliative therapy, and in that setting would offer a 2-3 week course of therapy.  We discussed the risks, benefits, short, and long term effects of radiotherapy, and the patient is interested in proceeding. Dr. Lisbeth Renshaw discusses the delivery and logistics of radiotherapy and anticipates a course of 2 weeks of radiotherapy. He will come in today at 3 pm for simulation at which time he will sign written consent to proceed. He is in agreement with this plan.  This encounter was  provided by telemedicine platform MyChart.  The patient has provided two factor identification and has given verbal consent for this type of encounter and has been advised to only accept a meeting of this type in a secure network environment. The time spent during this encounter was  45 minutes including preparation, discussion, and coordination of the patient's care. The attendants for this meeting include Blenda Nicely, RN, Dr. Lisbeth Renshaw, Hayden Pedro  and [name] along with his wife [name] and daughter.  During the encounter,  Blenda Nicely, RN, Dr. Lisbeth Renshaw, and Hayden Pedro were located at [facility] Radiation Oncology Department.  Rocky Gladden was located at home with his wife and daughter.   The above documentation reflects my direct findings during this shared patient visit. Please see the separate note by Dr. Lisbeth Renshaw on this date for the remainder of the patient's plan of care.    Carola Rhine, PAC

## 2020-04-05 ENCOUNTER — Encounter: Payer: Self-pay | Admitting: General Practice

## 2020-04-05 ENCOUNTER — Other Ambulatory Visit: Payer: Self-pay | Admitting: Radiology
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
## 2020-04-06 ENCOUNTER — Other Ambulatory Visit: Payer: Self-pay | Admitting: Radiology

## 2020-04-09 ENCOUNTER — Other Ambulatory Visit: Payer: Self-pay

## 2020-04-09 ENCOUNTER — Ambulatory Visit (HOSPITAL_COMMUNITY)
Admission: RE | Admit: 2020-04-09 | Discharge: 2020-04-09 | Disposition: A | Discharge status: Home / Self Care | Payer: Medicare Other | Source: Ambulatory Visit | Attending: Internal Medicine | Admitting: Internal Medicine

## 2020-04-09 DIAGNOSIS — C3491 Malignant neoplasm of unspecified part of right bronchus or lung: Secondary | ICD-10-CM | POA: Diagnosis not present

## 2020-04-09 DIAGNOSIS — Z96653 Presence of artificial knee joint, bilateral: Secondary | ICD-10-CM | POA: Diagnosis not present

## 2020-04-09 DIAGNOSIS — E119 Type 2 diabetes mellitus without complications: Secondary | ICD-10-CM | POA: Insufficient documentation

## 2020-04-09 DIAGNOSIS — C770 Secondary and unspecified malignant neoplasm of lymph nodes of head, face and neck: Secondary | ICD-10-CM | POA: Diagnosis not present

## 2020-04-09 DIAGNOSIS — Z8249 Family history of ischemic heart disease and other diseases of the circulatory system: Secondary | ICD-10-CM | POA: Diagnosis not present

## 2020-04-09 DIAGNOSIS — G4733 Obstructive sleep apnea (adult) (pediatric): Secondary | ICD-10-CM | POA: Insufficient documentation

## 2020-04-09 DIAGNOSIS — Z7982 Long term (current) use of aspirin: Secondary | ICD-10-CM | POA: Insufficient documentation

## 2020-04-09 DIAGNOSIS — Z833 Family history of diabetes mellitus: Secondary | ICD-10-CM | POA: Insufficient documentation

## 2020-04-09 DIAGNOSIS — I10 Essential (primary) hypertension: Secondary | ICD-10-CM | POA: Insufficient documentation

## 2020-04-09 DIAGNOSIS — Z7984 Long term (current) use of oral hypoglycemic drugs: Secondary | ICD-10-CM | POA: Diagnosis not present

## 2020-04-09 DIAGNOSIS — Z8261 Family history of arthritis: Secondary | ICD-10-CM | POA: Diagnosis not present

## 2020-04-09 DIAGNOSIS — Z87891 Personal history of nicotine dependence: Secondary | ICD-10-CM | POA: Insufficient documentation

## 2020-04-09 DIAGNOSIS — Z825 Family history of asthma and other chronic lower respiratory diseases: Secondary | ICD-10-CM | POA: Insufficient documentation

## 2020-04-09 DIAGNOSIS — E291 Testicular hypofunction: Secondary | ICD-10-CM | POA: Diagnosis not present

## 2020-04-09 DIAGNOSIS — K219 Gastro-esophageal reflux disease without esophagitis: Secondary | ICD-10-CM | POA: Insufficient documentation

## 2020-04-09 DIAGNOSIS — Z888 Allergy status to other drugs, medicaments and biological substances status: Secondary | ICD-10-CM | POA: Diagnosis not present

## 2020-04-09 DIAGNOSIS — R590 Localized enlarged lymph nodes: Secondary | ICD-10-CM | POA: Diagnosis present

## 2020-04-09 DIAGNOSIS — Z79899 Other long term (current) drug therapy: Secondary | ICD-10-CM | POA: Insufficient documentation

## 2020-04-09 LAB — GLUCOSE, CAPILLARY: Glucose-Capillary: 107 mg/dL — ABNORMAL HIGH (ref 70–99)

## 2020-04-09 MED ORDER — FENTANYL CITRATE (PF) 100 MCG/2ML IJ SOLN
INTRAMUSCULAR | Status: AC
  Filled 2020-04-09: qty 2

## 2020-04-09 MED ORDER — FENTANYL CITRATE (PF) 100 MCG/2ML IJ SOLN
INTRAMUSCULAR | Status: AC | PRN
  Administered 2020-04-09 (×2): 25 ug via INTRAVENOUS

## 2020-04-09 MED ORDER — LIDOCAINE HCL (PF) 1 % IJ SOLN
INTRAMUSCULAR | Status: AC
  Filled 2020-04-09: qty 30

## 2020-04-09 MED ORDER — MIDAZOLAM HCL 2 MG/2ML IJ SOLN
INTRAMUSCULAR | Status: AC
  Filled 2020-04-09: qty 2

## 2020-04-09 MED ORDER — MIDAZOLAM HCL 2 MG/2ML IJ SOLN
INTRAMUSCULAR | Status: AC | PRN
  Administered 2020-04-09: 1 mg via INTRAVENOUS

## 2020-04-09 NOTE — Discharge Instructions (Signed)

## 2020-04-09 NOTE — Procedures (Signed)
Pre Procedure Dx: Hypermetabolic right supraclavicular lymphadenopathy Post Procedural Dx: Same  Technically successful US guided biopsy of hypermetabolic right supraclavicular lymph node.   EBL: None  No immediate complications.   Ronny Bacon, MD Pager #: [phone]

## 2020-04-09 NOTE — H&P (Signed)
Chief Complaint: Patient was seen in consultation today for a right supraclavicular lymph node biopsy at the request of [facility]  Referring Physician(s): Mohamed,Mohamed  Supervising Physician: Sandi Mariscal  Patient Status: [facility] - Out-pt  History of Present Illness: Adam Wise is a 69 y.o. male with past medical history significant for hypertension, diabetes mellitus, GERD, obstructive sleep apnea as well as testosterone deficiency and intermittent history of smoking. In Early March 2021 he started having nasal congestion as well as tightness in the chest and ear aches with cough and was given prescription for antibiotics for a suspicious sinus infection. The patient had no improvement and he was seen at the respiratory clinic at [facility] health.  He had chest x-ray performed on March 14, 2020 that showed right upper lobe consolidative process suspicious for pneumonia.  He was treated with doxycycline but has no improvement in his condition. 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    PET scan on 03/28/2020: 1. Hypermetabolic obstructing right perihilar mass with hypermetabolic lymph nodes extending to the contralateral mediastinum and right anterior scalene nodal stations. Hypoattenuating lesions in the liver, some of which show abnormal hypermetabolism. 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 2. Nodular consolidation, ground-glass and septal thickening in the peripheral right upper lobe, indicative of lymphangitic Carcinomatosis  Note from Dr. Julien Nordmann: I will also refer the patient to interventional radiology for consideration of ultrasound-guided core biopsy of the left supraclavicular lymph node.  The patient  presents today for a right supraclavicular lymph node biopsy.   Past Medical History:  Diagnosis Date  . Chickenpox   . Chronic knee pain   . Chronic low back pain   . Essential hypertension   . GERD (gastroesophageal reflux disease)   . OSA (obstructive sleep apnea)   . Testosterone deficiency   . Type 2 diabetes mellitus ([place])     Past Surgical History:  Procedure Laterality Date  . COLONOSCOPY WITH PROPOFOL N/A 12/17/2018   Procedure: COLONOSCOPY WITH PROPOFOL;  Surgeon: Virgel Manifold, MD;  Location: [facility] ENDOSCOPY;  Service: Gastroenterology;  Laterality: N/A;  . JOINT REPLACEMENT Bilateral   . REPLACEMENT TOTAL KNEE BILATERAL  2015  . TONSILLECTOMY  1960    Allergies: Bupropion  Medications: Prior to Admission medications   Medication Sig Start Date End Date Taking? Authorizing Provider  acetaminophen (TYLENOL) 500 MG tablet Take 1,500 mg by mouth every 8 (eight) hours as needed for moderate pain or headache.   Yes [provider]  albuterol (VENTOLIN HFA) 108 (90 Base) MCG/ACT inhaler Inhale 2 puffs into the lungs every 6 (six) hours as needed for wheezing or shortness of breath. 03/14/20  Yes Nicolette Bang, DO  amLODipine (NORVASC) 10 MG tablet TAKE 1 TABLET BY MOUTH EVERY DAY Patient taking differently: Take 10 mg by mouth daily.  02/03/20  Yes [name], NP  aspirin EC 81 MG tablet Take 81 mg by mouth daily.    Yes [provider]  benzonatate (TESSALON) 100 MG capsule Take 1 capsule (100 mg total) by mouth 2 (two) times daily as needed for cough. Patient taking differently: Take 100 mg by mouth at bedtime.  03/14/20  Yes Nicolette Bang, DO  Black Pepper-Turmeric (TURMERIC COMPLEX/BLACK PEPPER PO) Take 1 tablet by mouth in  the morning and at bedtime.   Yes [provider]  Coenzyme Q10 ([employer] COQ-10) 200 MG capsule Take 200 mg by mouth daily.   Yes [provider]  esomeprazole (NEXIUM) 20 MG capsule  Take 20 mg by mouth daily.    Yes [provider]  fluticasone (FLONASE) 50 MCG/ACT nasal spray Place 2 sprays into both nostrils daily as needed for allergies.  03/16/17  Yes [provider]  gabapentin (NEURONTIN) 300 MG capsule Take 300 mg by mouth 2 (two) times daily.  10/03/19  Yes [provider]  glipiZIDE (GLUCOTROL) 10 MG tablet TAKE 1 TABLET (10 MG TOTAL) BY MOUTH 2 (TWO) TIMES DAILY BEFORE A MEAL. FOR DIABETES. 12/07/19  Yes [name], NP  HYDROcodone-homatropine [facility]) 5-1.5 MG/5ML syrup Take 5 mLs by mouth every 8 (eight) hours as needed for cough. Patient taking differently: Take 5 mLs by mouth at bedtime as needed for cough.  03/30/20  Yes Curt Bears, MD  KRILL OIL PO Take 350 mg by mouth daily.   Yes [provider]  lisinopril (ZESTRIL) 5 MG tablet TAKE 1 TABLET BY MOUTH EVERY DAY Patient taking differently: Take 5 mg by mouth daily.  02/03/20  Yes [name], NP  Melatonin 10 MG CAPS Take 10 mg by mouth at bedtime.   Yes [provider]  metFORMIN (GLUCOPHAGE) 1000 MG tablet TAKE 1 TABLET (1,000 MG TOTAL) BY MOUTH 2 (TWO) TIMES DAILY WITH A MEAL. 03/22/20  Yes [name], NP  Multiple Vitamin (MULTI-VITAMINS) TABS Take 1 tablet by mouth daily.    Yes [provider]  oxymetazoline (AFRIN) 0.05 % nasal spray Place 1 spray into both nostrils 2 (two) times daily as needed for congestion.   Yes [provider]  pioglitazone (ACTOS) 45 MG tablet TAKE 1 TABLET BY MOUTH EVERY DAY Patient taking differently: Take 45 mg by mouth daily.  02/03/20  Yes [name], NP  pravastatin (PRAVACHOL) 40 MG tablet TAKE 1 TABLET BY MOUTH EVERY DAY IN THE EVENING FOR CHOLESTEROL Patient taking differently: Take 40 mg by mouth daily.  02/03/20  Yes [name], NP  sildenafil (VIAGRA) 50 MG tablet TAKE 1 TABLET BY MOUTH EVERY DAY AS NEEDED Patient taking differently: Take 50 mg by mouth as needed for  erectile dysfunction.  09/06/18  Yes [name], NP  testosterone cypionate (DEPOTESTOSTERONE CYPIONATE) 200 MG/ML injection Inject 200 mg into the muscle every 14 (fourteen) days.   Yes [provider]  blood glucose meter kit and supplies KIT Dispense based on patient and insurance preference. Use up to four times daily as directed. 04/09/18   [name], NP  Lancets MISC USE UP TO 3 TIMES DAILY AS DIRECTED 05/06/18   [name], NP  [facility] ULTRA test strip USE UP TO 4 TIMES DAILY AS DIRECTED 02/07/20   [name], NP  Syringe/Needle, Disp, (SYRINGE 3CC/22GX1") 22G X 1" 3 ML MISC USE AS INSTRUCTED FOR TESTOSTERONE INJECTION EVERY 2 WEEKS 06/28/19   [name], NP     Family History  Problem Relation Age of Onset  . Cancer Mother   . Hypertension Mother   . Arthritis Father   . Asthma Father   . Cancer Father   . COPD Father   . Heart attack Father   . Hypertension Sister   . Cancer Sister   . Diabetes Sister   . Asthma Son   . Birth defects Maternal Grandfather   .  Arthritis Paternal Grandmother   . Diabetes Paternal Grandmother   . Arthritis Paternal Grandfather   . Asthma Sister   . Cancer Sister   . COPD Sister   . Arthritis Sister   . Asthma Sister   . Diabetes Sister   . Other Neg Hx        pituitary disorder    Social History   Socioeconomic History  . Marital status: Married    Spouse name: Not on file  . Number of children: Not on file  . Years of education: Not on file  . Highest education level: Not on file  Occupational History  . Not on file  Tobacco Use  . Smoking status: Former Research scientist (life sciences)  . Smokeless tobacco: Never Used  Substance and Sexual Activity  . Alcohol use: Yes    Comment: rarely  . Drug use: Not Currently  . Sexual activity: Yes  Other Topics Concern  . Not on file  Social History Narrative   Married.   Moved from [place].   Retired.   Social Determinants of Health   Financial Resource  Strain: Low Risk   . Difficulty of Paying Living Expenses: Not hard at all  Food Insecurity: No Food Insecurity  . Worried About Charity fundraiser in the Last Year: Never true  . Ran Out of Food in the Last Year: Never true  Transportation Needs: No Transportation Needs  . Lack of Transportation (Medical): No  . Lack of Transportation (Non-Medical): No  Physical Activity: Inactive  . Days of Exercise per Week: 0 days  . Minutes of Exercise per Session: 0 min  Stress: No Stress Concern Present  . Feeling of Stress : Not at all  Social Connections:   . Frequency of Communication with Friends and Family:   . Frequency of Social Gatherings with Friends and Family:   . Attends Religious Services:   . Active Member of Clubs or Organizations:   . Attends Archivist Meetings:   Marland Kitchen Marital Status:     Review of Systems: A 12 point ROS discussed and pertinent positives are indicated in the HPI above.  All other systems are negative.  Review of Systems  Constitutional: Negative for fever.  Respiratory: Negative for shortness of breath.   Cardiovascular: Positive for leg swelling. Negative for chest pain.       Bilateral lower extremity edema; patient states this has been ongoing.   Gastrointestinal: Negative for abdominal pain.    Vital Signs: BP (!) 152/90   Pulse 91   Temp 98.8 F (37.1 C) (Oral)   Ht _0  (1.753 m)   Wt 266 lb (120.7 kg)   SpO2 96%   BMI 39.28 kg/m   Physical Exam Constitutional:      General: He is not in acute distress.    Appearance: Normal appearance.  Cardiovascular:     Rate and Rhythm: Normal rate and regular rhythm.     Pulses: Normal pulses.     Heart sounds: Normal heart sounds.  Pulmonary:     Effort: Pulmonary effort is normal.     Breath sounds: Normal breath sounds.  Skin:    General: Skin is warm and dry.  Neurological:     Mental Status: He is alert and oriented to person, place, and time.  Psychiatric:        Mood and  Affect: Mood normal.        Behavior: Behavior normal.  Thought Content: Thought content normal.        Judgment: Judgment normal.     Imaging: CT Chest W Contrast  Result Date: 03/26/2020 CLINICAL DATA:  Persistent cough, congestion wheezing and shortness of breath. Negative COVID test. Two rounds of antibiotics without improvement. Consolidation on chest x-ray. EXAM: CT CHEST WITH CONTRAST TECHNIQUE: Multidetector CT imaging of the chest was performed during intravenous contrast administration. CONTRAST:  23m OMNIPAQUE IOHEXOL 300 MG/ML  SOLN COMPARISON:  Chest radiograph 03/14/2020. FINDINGS: Cardiovascular: Atherosclerotic calcification of the aorta and coronary arteries. Pulmonic trunk is enlarged. Right pulmonary artery branches are encased and narrowed. There is narrowing of the SVC by an adjacent mass. Heart size normal. No pericardial effusion. Mediastinum/Nodes: Low right internal jugular and anterior scalene lymph nodes measure up to 1.5 cm (2/7). Bulky mediastinal adenopathy. Index low right paratracheal mass measures approximately 2.9 x 4.3 cm. Subcarinal nodal mass measures 3.5 x 5.1 cm. Low left paratracheal lymph node measures 1.4 cm and a high AP window lymph node measures 7 mm, indicating contralateral mediastinal involvement. There is a right hilar mass/adenopathy encasing the right pulmonary artery branches and obstructing the right upper and middle lobe bronchi. Postobstructive collapse/consolidation in the right upper and right middle lobes makes measurement difficult. Septal thickening and nodularity are seen in the right upper lobe is well. No axillary adenopathy. Prepericardiac lymph nodes measure up to 1.6 cm. Esophagus is grossly unremarkable. Lungs/Pleura: Right upper lobe/right hilar mass obstructs the right upper and middle lobe bronchi. There is postobstructive collapse/consolidation in the right upper lobe with septal thickening and nodularity. Complete  collapse/consolidation involving the right middle lobe. Mass also encases and narrows the right pulmonary artery branches and severely narrows bronchus intermedius. Small right pleural effusion. 2 mm left upper lobe nodule (3/53) unchanged and benign. Minimal subpleural scarring in the left lower lobe. No left pleural fluid. Upper Abdomen: Visualized portion of the liver is unremarkable. 1.7 cm right adrenal nodule measures 31 Hounsfield units but is stable from 09/25/2017. Visualized portion of the left adrenal gland is unremarkable. Exophytic low-attenuation lesion off the upper pole right kidney measures at least 3.2 cm, partially imaged. Visualized portions of the kidneys, spleen, pancreas, stomach and bowel are otherwise unremarkable. Musculoskeletal: Degenerative changes in the spine. No worrisome lytic or sclerotic lesions. IMPRESSION: 1. 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 Associated bulky mediastinal and subcarinal adenopathy and small right pleural effusion. Findings are indicative of primary bronchogenic carcinoma and at least T4 N3 M0 or stage IIIC disease. If the right pleural effusion is malignant, findings are consistent with stage IV lung cancer. 2. Right adrenal nodule is stable and therefore indicative of an adenoma. 3. Aortic atherosclerosis (ICD10-I70.0). Coronary artery calcification. 4. Enlarged pulmonic trunk with encasement and narrowing of the right upper lobe pulmonary arterial branches. Electronically Signed   By: MLorin PicketM.D.   On: 03/26/2020 12:24   MR BRAIN W WO CONTRAST  Result Date: 04/04/2020 CLINICAL DATA:  Malignant neoplasm of unspecified part of unspecified bronchus or lung. Non-small cell lung cancer, staging. Additional history provided by scanning technologist: Stage IV lung cancer, evaluate for metastases. EXAM:  MRI HEAD WITHOUT AND WITH CONTRAST TECHNIQUE: Multiplanar, multiecho pulse sequences of the brain and surrounding structures were obtained without and with intravenous contrast. CONTRAST:  149mGADAVIST GADOBUTROL 1 MMOL/ML IV SOLN COMPARISON:  Prior brain MRI examinations 10/19/2019 and earlier.  FINDINGS: Brain: Postcontrast imaging is mildly motion degraded. Subcentimeter symmetric nodular enhancing foci at the level of the foramen magnum have remained stable dating back to prior MRI 10/15/2017 and are favored benign. No abnormal enhancement is identified elsewhere within the brain to suggest intracranial metastatic disease. A known 4 mm left intrasellar cyst is suboptimally assessed on this non pituitary protocol brain MRI, but appears unchanged as compared to 10/19/2019 (series 17, image 18). Stable mild patchy T2/FLAIR hyperintensity within the cerebral white matter which is nonspecific, but consistent with chronic small vessel ischemic disease. Stable small arachnoid cyst along the right insula/sylvian fissure. There is no evidence of acute infarct. There is no midline shift or extra-axial fluid collection. No chronic intracranial blood products. Cerebral volume is normal for age. Vascular: Flow voids maintained within the proximal large arterial vessels. Skull and upper cervical spine: No focal marrow lesion. Sinuses/Orbits: Visualized orbits demonstrate no acute abnormality. Mild ethmoid sinus mucosal thickening. Tiny right maxillary sinus mucous retention cyst. No significant mastoid effusion. IMPRESSION: 1. Mildly motion degraded post-contrast imaging. 2. Symmetric subcentimeter nodular enhancing foci at the level of the foramen magnum have remained stable dating back to MRI 10/15/2017 and are favored benign. No abnormal intracranial enhancement is identified elsewhere to suggest metastatic disease. 3. Unchanged 4 mm left intrasellar cyst. Differential considerations include arachnoid cyst or cystic  pituitary adenoma. 4. Stable, mild chronic small vessel ischemic changes within the cerebral white matter. Electronically Signed   By: Kellie Simmering DO   On: 04/04/2020 11:24   NM PET Image Initial (PI) Skull Base To Thigh  Result Date: 03/28/2020 CLINICAL DATA:  Initial treatment strategy for right lung mass. EXAM: NUCLEAR MEDICINE PET SKULL BASE TO THIGH TECHNIQUE: 12.7 mCi F-18 FDG was injected intravenously. Full-ring PET imaging was performed from the skull base to thigh after the radiotracer. CT data was obtained and used for attenuation correction and anatomic localization. Fasting blood glucose: 163 mg/dl COMPARISON:  CT chest 03/26/2020 and 09/25/2017. FINDINGS: Mediastinal blood pool activity: SUV max 3.1 Liver activity: SUV max NA NECK: No abnormal hypermetabolism. Incidental CT findings: None. CHEST: Hypermetabolic 11 mm right anterior scalene lymph node has an SUV max of 4.6. Hypermetabolic mediastinal lymph nodes extend to the contralateral left mediastinum where there is a 1.6 cm low left paratracheal lymph node (4/75) with an SUV 4.0. Index low right paratracheal lymph node measures 1.9 cm (4/68) with an SUV max of 7.0. Bulky subcarinal nodal mass measures 3.5 x 5.2 cm with an SUV max of 5.9. The borders of a right hilar mass are difficult to discern given postobstructive collapse/consolidation in the right upper and right middle lobes but, has an SUV max of 7.6. No hypermetabolic left hilar or axillary adenopathy. Incidental CT findings: Atherosclerotic calcification of the aorta and coronary arteries. Heart is at the upper limits of normal in size to mildly enlarged. No pericardial effusion. Trace right pleural effusion, decreased. As discussed on 03/26/2020, there is complete obstruction of the right upper and right middle lobe bronchi with postobstructive collapse/consolidation. Nodular consolidation, ground-glass and septal thickening are seen in the peripheral right upper lobe.  ABDOMEN/PELVIS: There are subtle hypermetabolic low attenuation lesions in the liver. Index lesion in the dome of the liver measures approximately 1.9 cm (4/97) with an SUV max of 3.5. A second index lesion in the inferior right hepatic lobe measures approximately 2.1 cm (4/126) with an SUV max of 4.5. There are additional suspicious hypoattenuating lesions in the liver bilaterally, many without definitive  associated hypermetabolism. No abnormal hypermetabolism in adrenal glands, spleen or pancreas. No hypermetabolic lymph nodes. Incidental CT findings: Liver and gallbladder are otherwise unremarkable. Fluid density nodule in the left adrenal gland measures 2.1 cm. Left adrenal gland is unremarkable. Low-attenuation lesions in the right kidney measure up to 6.3 cm and are likely cysts. Left kidney, spleen, pancreas and stomach are unremarkable. Duodenal diverticulum is incidentally noted. Small bowel and colon are otherwise grossly unremarkable. Prostate is mildly enlarged. SKELETON: Mild hypermetabolism overlying the greater trochanter of the left femur is likely inflammatory in etiology, as can be seen with bursitis. Otherwise, no additional abnormal hypermetabolism. Incidental CT findings: Degenerative changes in the spine. IMPRESSION: 1. Hypermetabolic obstructing right perihilar mass with hypermetabolic lymph nodes extending to the contralateral mediastinum and right anterior scalene nodal stations. Hypoattenuating lesions in the liver, some of which show abnormal hypermetabolism. 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 2. Nodular consolidation, ground-glass and septal thickening in the peripheral right upper lobe, indicative of lymphangitic carcinomatosis. 3. Trace right pleural effusion, decreased from 2 days prior. 4. Right adrenal adenoma. 5. Mildly enlarged prostate. 6. Aortic atherosclerosis (ICD10-I70.0). Coronary artery calcification. Electronically Signed   By:  Lorin Picket M.D.   On: 03/28/2020 09:11   DG Chest Portable 2 Views  Result Date: 03/14/2020 CLINICAL DATA:  Cough and short of breath EXAM: CHEST  2 VIEW PORTABLE COMPARISON:  CT 09/25/2017 FINDINGS: Right upper lobe consolidative process. No pleural effusion. Borderline cardiomegaly. No pneumothorax. IMPRESSION: Right upper lobe consolidative process, may reflect pneumonia in the appropriate clinical setting. Radiographic follow-up to resolution is advised as potential lung mass could also produce this appearance. If more urgent imaging is desired or clinical symptoms are not consistent with pneumonia, contrast-enhanced chest CT could be obtained for further evaluation. Electronically Signed   By: Donavan Foil M.D.   On: 03/14/2020 18:45    Labs:  CBC: Recent Labs    03/26/20 0936 03/30/20 0956  WBC 7.5 10.3  HGB 15.3 15.5  HCT 46.2 48.1  PLT 176.0 216    COAGS: No results for input(s): INR, APTT in the last 8760 hours.  BMP: Recent Labs    11/01/19 0846 03/26/20 0936 03/26/20 1123 03/30/20 0956  NA 141 137  --  142  K 4.7 5.7 slight hemolysis*  --  4.9  CL 104 99  --  99  CO2 29 32  --  27  GLUCOSE 158* 141*  --  257*  BUN 15 15  --  25*  CALCIUM 9.2 9.8  --  9.3  CREATININE 1.19 1.31 1.26* 1.47*  GFRNONAA  --   --  58* 48*  GFRAA  --   --  >60 56*    LIVER FUNCTION TESTS: Recent Labs    11/01/19 0846 03/30/20 0956  BILITOT 0.3 0.4  AST 23 17  ALT 22 21  ALKPHOS 62 78  PROT 6.7 6.9  ALBUMIN 4.4 3.6    TUMOR MARKERS: No results for input(s): AFPTM, CEA, CA199, CHROMGRNA in the last 8760 hours.  Assessment and Plan:  Adam Wise is a 69 y.o. male with past medical history significant for hypertension, diabetes mellitus, GERD, obstructive sleep apnea as well as testosterone deficiency and intermittent history of smoking. In Early March 2021 he started having nasal congestion as well as tightness in the chest and ear aches with cough and was given  prescription for antibiotics for a suspicious sinus infection. The patient had no improvement and he  was seen at the respiratory clinic at [facility] health.  He had chest x-ray performed on March 14, 2020 that showed right upper lobe consolidative process suspicious for pneumonia.  He was treated with doxycycline but has no improvement in his condition. 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    Patient presents today to the [facility] Interventional Radiology department for a supraclavicular lymph node biopsy to be performed by Dr. Pascal Lux.  Risks and benefits of right supraclavicular lymph node biopsy was discussed with the patient and/or patient's family including, but not limited to bleeding, infection, damage to adjacent structures or low yield requiring additional tests.  All of the questions were answered and there is agreement to proceed.  Consent signed and in chart.   Thank you for this interesting consult.  I greatly enjoyed meeting Adam Wise and look forward to participating in their care.  A copy of this report was sent to the requesting provider on this date.  Electronically Signed: Theresa Duty, NP 04/09/2020, 12:12 PM   I spent a total of  30 Minutes   in face to face in clinical consultation, greater than 50% of which was counseling/coordinating care for right supraclavicular lymph node biopsy

## 2020-04-10 LAB — SURGICAL PATHOLOGY

## 2020-04-11 ENCOUNTER — Ambulatory Visit (HOSPITAL_COMMUNITY)
Admission: RE | Admit: 2020-04-11 | Discharge: 2020-04-11 | Disposition: A | Discharge status: Home / Self Care | Payer: Medicare Other | Source: Ambulatory Visit | Attending: Internal Medicine | Admitting: Internal Medicine

## 2020-04-11 ENCOUNTER — Telehealth: Payer: Self-pay | Admitting: Radiation Oncology

## 2020-04-11 ENCOUNTER — Other Ambulatory Visit: Payer: Self-pay

## 2020-04-11 DIAGNOSIS — Z510 Encounter for antineoplastic radiation therapy: Secondary | ICD-10-CM | POA: Diagnosis not present

## 2020-04-11 DIAGNOSIS — C787 Secondary malignant neoplasm of liver and intrahepatic bile duct: Secondary | ICD-10-CM | POA: Insufficient documentation

## 2020-04-11 MED ORDER — GADOBUTROL 1 MMOL/ML IV SOLN
10.0000 mL | Freq: Once | INTRAVENOUS | Status: AC | PRN
  Administered 2020-04-11: 10 mL via INTRAVENOUS

## 2020-04-11 NOTE — Telephone Encounter (Signed)
I called and spoke with the patient's wife and we reviewed small cell pathology and plans to proceed with 3 weeks of xrt so he can proceed with systemic therapy.

## 2020-04-12 ENCOUNTER — Ambulatory Visit
Admission: RE | Admit: 2020-04-12 | Discharge: 2020-04-12 | Disposition: A | Discharge status: Home / Self Care | Payer: Medicare Other | Source: Ambulatory Visit | Attending: Radiation Oncology | Admitting: Radiation Oncology

## 2020-04-12 ENCOUNTER — Telehealth: Payer: Self-pay | Admitting: Internal Medicine

## 2020-04-12 ENCOUNTER — Telehealth: Payer: Self-pay | Admitting: Medical Oncology

## 2020-04-12 ENCOUNTER — Inpatient Hospital Stay (HOSPITAL_BASED_OUTPATIENT_CLINIC_OR_DEPARTMENT_OTHER): Payer: Medicare Other | Admitting: Internal Medicine

## 2020-04-12 ENCOUNTER — Other Ambulatory Visit: Payer: Self-pay

## 2020-04-12 ENCOUNTER — Encounter: Payer: Self-pay | Admitting: Internal Medicine

## 2020-04-12 VITALS — BP 157/71 | HR 94 | Temp 98.5°F | Resp 18 | Ht 69.0 in | Wt 260.5 lb

## 2020-04-12 DIAGNOSIS — C3411 Malignant neoplasm of upper lobe, right bronchus or lung: Secondary | ICD-10-CM | POA: Insufficient documentation

## 2020-04-12 DIAGNOSIS — Z5111 Encounter for antineoplastic chemotherapy: Secondary | ICD-10-CM

## 2020-04-12 DIAGNOSIS — Z5112 Encounter for antineoplastic immunotherapy: Secondary | ICD-10-CM | POA: Insufficient documentation

## 2020-04-12 DIAGNOSIS — R5382 Chronic fatigue, unspecified: Secondary | ICD-10-CM | POA: Diagnosis not present

## 2020-04-12 DIAGNOSIS — Z510 Encounter for antineoplastic radiation therapy: Secondary | ICD-10-CM | POA: Diagnosis not present

## 2020-04-12 DIAGNOSIS — I10 Essential (primary) hypertension: Secondary | ICD-10-CM

## 2020-04-12 NOTE — Telephone Encounter (Signed)
May 4th -appt for port a cath. Pt given information and to be NPO after mn , do not take diabetic meds , may take other morning meds and must have a driver.

## 2020-04-12 NOTE — Progress Notes (Signed)
[facility] [phone]) [phone]   [phone]  OFFICE PROGRESS NOTE  [name], NP [place] [address]  DIAGNOSIS: Extensive stage (T4, N3, M1c)  small cell lung cancer diagnosed in April 2021 and presented with large right upper lobe/right hilar mass with ipsilateral and contralateral mediastinal and right supraclavicular lymphadenopathy in addition to multiple liver lesios  PRIOR THERAPY: Palliative radiotherapy to the right upper lobe lung mass under the care of Dr. Lisbeth Renshaw  CURRENT THERAPY: Systemic chemotherapy with carboplatin for AUC of 5 on day 1, etoposide 100 mg/M2 on days 1, 2 and 3 in addition to Imfinzi 1500 mg IV every 3 weeks with chemotherapy then every 4 weeks for maintenance if the patient has no evidence for progression.  He will also receive Cosela 240 mg/m2 on the days of the chemotherapy.  INTERVAL HISTORY: Adam Wise 69 y.o. male returns to the clinic today for follow-up visit accompanied by his wife.  The patient is feeling fine today with no concerning complaints except for fatigue and shortness of breath with exertion.  He started the first fraction of palliative radiotherapy to the right upper lobe lung mass under the care of Dr. Lisbeth Renshaw.  The patient denied having any current chest pain but continues to have cough with no hemoptysis.  He denied having any fever or chills.  He has no nausea, vomiting, diarrhea or constipation.  He has no headache or visual changes.  He had several studies performed recently including a PET scan as well as MRI of the liver and brain MRI.  He also underwent ultrasound-guided core biopsy of the right supraclavicular lymph node and the final pathology was consistent with small cell lung cancer.  He is here today for evaluation and discussion of his treatment options.  MEDICAL HISTORY: Past Medical History:  Diagnosis Date   Chickenpox    Chronic knee pain    Chronic low back  pain    Essential hypertension    GERD (gastroesophageal reflux disease)    OSA (obstructive sleep apnea)    Testosterone deficiency    Type 2 diabetes mellitus (HCC)     ALLERGIES:  is allergic to bupropion.  MEDICATIONS:  Current Outpatient Medications  Medication Sig Dispense Refill   acetaminophen (TYLENOL) 500 MG tablet Take 1,500 mg by mouth every 8 (eight) hours as needed for moderate pain or headache.     albuterol (VENTOLIN HFA) 108 (90 Base) MCG/ACT inhaler Inhale 2 puffs into the lungs every 6 (six) hours as needed for wheezing or shortness of breath. 18 g 0   amLODipine (NORVASC) 10 MG tablet TAKE 1 TABLET BY MOUTH EVERY DAY (Patient taking differently: Take 10 mg by mouth daily. ) 90 tablet 1   aspirin EC 81 MG tablet Take 81 mg by mouth daily.      benzonatate (TESSALON) 100 MG capsule Take 1 capsule (100 mg total) by mouth 2 (two) times daily as needed for cough. (Patient taking differently: Take 100 mg by mouth at bedtime. ) 30 capsule 0   Black Pepper-Turmeric (TURMERIC COMPLEX/BLACK PEPPER PO) Take 1 tablet by mouth in the morning and at bedtime.     blood glucose meter kit and supplies KIT Dispense based on patient and insurance preference. Use up to four times daily as directed. 1 each 0   Coenzyme Q10 ([employer] COQ-10) 200 MG capsule Take 200 mg by mouth daily.     esomeprazole (NEXIUM) 20 MG  capsule Take 20 mg by mouth daily.      fluticasone (FLONASE) 50 MCG/ACT nasal spray Place 2 sprays into both nostrils daily as needed for allergies.      gabapentin (NEURONTIN) 300 MG capsule Take 300 mg by mouth 2 (two) times daily.      glipiZIDE (GLUCOTROL) 10 MG tablet TAKE 1 TABLET (10 MG TOTAL) BY MOUTH 2 (TWO) TIMES DAILY BEFORE A MEAL. FOR DIABETES. 180 tablet 2   HYDROcodone-homatropine (HYCODAN) 5-1.5 MG/5ML syrup Take 5 mLs by mouth every 8 (eight) hours as needed for cough. (Patient taking differently: Take 5 mLs by mouth at bedtime as needed for cough. )  120 mL 0   KRILL OIL PO Take 350 mg by mouth daily.     Lancets MISC USE UP TO 3 TIMES DAILY AS DIRECTED 100 each 2   lisinopril (ZESTRIL) 5 MG tablet TAKE 1 TABLET BY MOUTH EVERY DAY (Patient taking differently: Take 5 mg by mouth daily. ) 90 tablet 1   Melatonin 10 MG CAPS Take 10 mg by mouth at bedtime.     metFORMIN (GLUCOPHAGE) 1000 MG tablet TAKE 1 TABLET (1,000 MG TOTAL) BY MOUTH 2 (TWO) TIMES DAILY WITH A MEAL. 180 tablet 1   Multiple Vitamin (MULTI-VITAMINS) TABS Take 1 tablet by mouth daily.      ONETOUCH ULTRA test strip USE UP TO 4 TIMES DAILY AS DIRECTED 100 strip 5   oxymetazoline (AFRIN) 0.05 % nasal spray Place 1 spray into both nostrils 2 (two) times daily as needed for congestion.     pioglitazone (ACTOS) 45 MG tablet TAKE 1 TABLET BY MOUTH EVERY DAY (Patient taking differently: Take 45 mg by mouth daily. ) 90 tablet 1   pravastatin (PRAVACHOL) 40 MG tablet TAKE 1 TABLET BY MOUTH EVERY DAY IN THE EVENING FOR CHOLESTEROL (Patient taking differently: Take 40 mg by mouth daily. ) 90 tablet 1   sildenafil (VIAGRA) 50 MG tablet TAKE 1 TABLET BY MOUTH EVERY DAY AS NEEDED (Patient taking differently: Take 50 mg by mouth as needed for erectile dysfunction. ) 12 tablet 1   Syringe/Needle, Disp, (SYRINGE 3CC/22GX1") 22G X 1" 3 ML MISC USE AS INSTRUCTED FOR TESTOSTERONE INJECTION EVERY 2 WEEKS 10 each 1   testosterone cypionate (DEPOTESTOSTERONE CYPIONATE) 200 MG/ML injection Inject 200 mg into the muscle every 14 (fourteen) days.     No current facility-administered medications for this visit.    SURGICAL HISTORY:  Past Surgical History:  Procedure Laterality Date   COLONOSCOPY WITH PROPOFOL N/A 12/17/2018   Procedure: COLONOSCOPY WITH PROPOFOL;  Surgeon: Virgel Manifold, MD;  Location: [facility] ENDOSCOPY;  Service: Gastroenterology;  Laterality: N/A;   JOINT REPLACEMENT Bilateral    REPLACEMENT TOTAL KNEE BILATERAL  2015   TONSILLECTOMY  1960    REVIEW OF SYSTEMS:   Constitutional: positive for fatigue Eyes: negative Ears, nose, mouth, throat, and face: negative Respiratory: positive for cough and dyspnea on exertion Cardiovascular: negative Gastrointestinal: negative Genitourinary:negative Integument/breast: negative Hematologic/lymphatic: negative Musculoskeletal:negative Neurological: negative Behavioral/Psych: negative Endocrine: negative Allergic/Immunologic: negative   PHYSICAL EXAMINATION: General appearance: alert, cooperative, fatigued and no distress Head: atraumatic Neck: no adenopathy, no JVD, supple, symmetrical, trachea midline and thyroid not enlarged, symmetric, no tenderness/mass/nodules Lymph nodes: Cervical, supraclavicular, and axillary nodes normal. Resp: diminished breath sounds RUL and dullness to percussion RUL Back: symmetric, no curvature. ROM normal. No CVA tenderness. Cardio: regular rate and rhythm, S1, S2 normal, no murmur, click, rub or gallop GI: soft, non-tender; bowel sounds normal; no  masses,  no organomegaly Extremities: extremities normal, atraumatic, no cyanosis or edema Neurologic: Alert and oriented X 3, normal strength and tone. Normal symmetric reflexes. Normal coordination and gait  ECOG PERFORMANCE STATUS: 1 - Symptomatic but completely ambulatory  Blood pressure (!) 157/71, pulse 94, temperature 98.5 F (36.9 C), temperature source Temporal, resp. rate 18, height '5\' 9"'$  (1.753 m), weight 260 lb 8 oz (118.2 kg), SpO2 96 %.  LABORATORY DATA: Lab Results  Component Value Date   WBC 10.3 03/30/2020   HGB 15.5 03/30/2020   HCT 48.1 03/30/2020   MCV 91.1 03/30/2020   PLT 216 03/30/2020      Chemistry      Component Value Date/Time   NA 142 03/30/2020 0956   K 4.9 03/30/2020 0956   CL 99 03/30/2020 0956   CO2 27 03/30/2020 0956   BUN 25 (H) 03/30/2020 0956   CREATININE 1.47 (H) 03/30/2020 0956      Component Value Date/Time   CALCIUM 9.3 03/30/2020 0956   ALKPHOS 78 03/30/2020 0956   AST  17 03/30/2020 0956   ALT 21 03/30/2020 0956   BILITOT 0.4 03/30/2020 0956       RADIOGRAPHIC STUDIES: CT Chest W Contrast  Result Date: 03/26/2020 CLINICAL DATA:  Persistent cough, congestion wheezing and shortness of breath. Negative COVID test. Two rounds of antibiotics without improvement. Consolidation on chest x-ray. EXAM: CT CHEST WITH CONTRAST TECHNIQUE: Multidetector CT imaging of the chest was performed during intravenous contrast administration. CONTRAST:  45m OMNIPAQUE IOHEXOL 300 MG/ML  SOLN COMPARISON:  Chest radiograph 03/14/2020. FINDINGS: Cardiovascular: Atherosclerotic calcification of the aorta and coronary arteries. Pulmonic trunk is enlarged. Right pulmonary artery branches are encased and narrowed. There is narrowing of the SVC by an adjacent mass. Heart size normal. No pericardial effusion. Mediastinum/Nodes: Low right internal jugular and anterior scalene lymph nodes measure up to 1.5 cm (2/7). Bulky mediastinal adenopathy. Index low right paratracheal mass measures approximately 2.9 x 4.3 cm. Subcarinal nodal mass measures 3.5 x 5.1 cm. Low left paratracheal lymph node measures 1.4 cm and a high AP window lymph node measures 7 mm, indicating contralateral mediastinal involvement. There is a right hilar mass/adenopathy encasing the right pulmonary artery branches and obstructing the right upper and middle lobe bronchi. Postobstructive collapse/consolidation in the right upper and right middle lobes makes measurement difficult. Septal thickening and nodularity are seen in the right upper lobe is well. No axillary adenopathy. Prepericardiac lymph nodes measure up to 1.6 cm. Esophagus is grossly unremarkable. Lungs/Pleura: Right upper lobe/right hilar mass obstructs the right upper and middle lobe bronchi. There is postobstructive collapse/consolidation in the right upper lobe with septal thickening and nodularity. Complete collapse/consolidation involving the right middle lobe. Mass  also encases and narrows the right pulmonary artery branches and severely narrows bronchus intermedius. Small right pleural effusion. 2 mm left upper lobe nodule (3/53) unchanged and benign. Minimal subpleural scarring in the left lower lobe. No left pleural fluid. Upper Abdomen: Visualized portion of the liver is unremarkable. 1.7 cm right adrenal nodule measures 31 Hounsfield units but is stable from 09/25/2017. Visualized portion of the left adrenal gland is unremarkable. Exophytic low-attenuation lesion off the upper pole right kidney measures at least 3.2 cm, partially imaged. Visualized portions of the kidneys, spleen, pancreas, stomach and bowel are otherwise unremarkable. Musculoskeletal: Degenerative changes in the spine. No worrisome lytic or sclerotic lesions. IMPRESSION: 1. 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 Associated bulky mediastinal and subcarinal adenopathy and small right pleural effusion. Findings are indicative of primary bronchogenic carcinoma and at least T4 N3 M0 or stage IIIC disease. If the right pleural effusion is malignant, findings are consistent with stage IV lung cancer. 2. Right adrenal nodule is stable and therefore indicative of an adenoma. 3. Aortic atherosclerosis (ICD10-I70.0). Coronary artery calcification. 4. Enlarged pulmonic trunk with encasement and narrowing of the right upper lobe pulmonary arterial branches. Electronically Signed   By: Lorin Picket M.D.   On: 03/26/2020 12:24   MR BRAIN W WO CONTRAST  Result Date: 04/04/2020 CLINICAL DATA:  Malignant neoplasm of unspecified part of unspecified bronchus or lung. Non-small cell lung cancer, staging. Additional history provided by scanning technologist: Stage IV lung cancer, evaluate for metastases. EXAM: MRI HEAD WITHOUT AND WITH CONTRAST TECHNIQUE: Multiplanar,  multiecho pulse sequences of the brain and surrounding structures were obtained without and with intravenous contrast. CONTRAST:  97m GADAVIST GADOBUTROL 1 MMOL/ML IV SOLN COMPARISON:  Prior brain MRI examinations 10/19/2019 and earlier. FINDINGS: Brain: Postcontrast imaging is mildly motion degraded. Subcentimeter symmetric nodular enhancing foci at the level of the foramen magnum have remained stable dating back to prior MRI 10/15/2017 and are favored benign. No abnormal enhancement is identified elsewhere within the brain to suggest intracranial metastatic disease. A known 4 mm left intrasellar cyst is suboptimally assessed on this non pituitary protocol brain MRI, but appears unchanged as compared to 10/19/2019 (series 17, image 18). Stable mild patchy T2/FLAIR hyperintensity within the cerebral white matter which is nonspecific, but consistent with chronic small vessel ischemic disease. Stable small arachnoid cyst along the right insula/sylvian fissure. There is no evidence of acute infarct. There is no midline shift or extra-axial fluid collection. No chronic intracranial blood products. Cerebral volume is normal for age. Vascular: Flow voids maintained within the proximal large arterial vessels. Skull and upper cervical spine: No focal marrow lesion. Sinuses/Orbits: Visualized orbits demonstrate no acute abnormality. Mild ethmoid sinus mucosal thickening. Tiny right maxillary sinus mucous retention cyst. No significant mastoid effusion. IMPRESSION: 1. Mildly motion degraded post-contrast imaging. 2. Symmetric subcentimeter nodular enhancing foci at the level of the foramen magnum have remained stable dating back to MRI 10/15/2017 and are favored benign. No abnormal intracranial enhancement is identified elsewhere to suggest metastatic disease. 3. Unchanged 4 mm left intrasellar cyst. Differential considerations include arachnoid cyst or cystic pituitary adenoma. 4. Stable, mild chronic small vessel ischemic  changes within the cerebral white matter. Electronically Signed   By: KKellie SimmeringDO   On: 04/04/2020 11:24   MR LIVER W WO CONTRAST  Result Date: 04/11/2020 CLINICAL DATA:  Primary Cancer Type: Lung Imaging Indication: Initial staging, evaluate low-attenuation liver lesions on recent PET-CT Initial Cancer Diagnosis Date: 03/28/2020 Established by: Biopsy-proven Detailed Pathology: Right supraclavicular lymph node biopsy 04/09/2020: Metastatic small cell carcinoma. Primary Tumor location:  Laterality: Right Cancer Specific: Non-small cell stage IV lung cancer. Large right upper lobe/right hilar mass. EXAM: MRI ABDOMEN WITHOUT AND WITH CONTRAST TECHNIQUE: Multiplanar multisequence MR imaging of the abdomen was performed both before and after the administration of intravenous contrast. CONTRAST:  111mGADAVIST GADOBUTROL 1 MMOL/ML IV SOLN COMPARISON:  03/28/2020 PET-CT. FINDINGS: Lower chest: Partially visualized known right perihilar lung mass. Small dependent right pleural effusion. Hepatobiliary: Normal liver size. No significant hepatic steatosis. There are numerous (greater than 15) similar T2 hyperintense liver masses scattered throughout the liver with targetoid enhancement with representative 3.0 x 2.9  cm segment 5 right liver mass (series 19/image 46), 2.8 x 2.1 cm caudate lobe mass (series 19/image 26) and 2.4 x 1.9 cm segment 8 right liver mass (series 19/image 19). Normal gallbladder with no cholelithiasis. No biliary ductal dilatation. Common bile duct diameter 3 mm. No choledocholithiasis. No biliary masses, strictures or beading. Small to moderate periampullary duodenal diverticulum. Pancreas: No pancreatic mass or duct dilation. There is pancreas divisum. Spleen: Normal size. No mass. Adrenals/Urinary Tract: Right adrenal 1.7 cm adenoma, demonstrating significant loss of signal intensity on out of phase chemical shift imaging. No left adrenal nodules. No hydronephrosis. Numerous simple right  greater than left renal cysts, largest 6.5 cm in the interpolar right kidney. No suspicious renal masses. Stomach/Bowel: Normal non-distended stomach. Visualized small and large bowel is normal caliber, with no bowel wall thickening. Vascular/Lymphatic: Atherosclerotic nonaneurysmal abdominal aorta. Patent portal, splenic, hepatic and renal veins. No pathologically enlarged lymph nodes in the abdomen. Other: No abdominal ascites or focal fluid collection. Musculoskeletal: Numerous small enhancing T2 hyperintense osseous lesions throughout the visualized axial skeleton, for example measuring 2.1 cm in the medial right iliac bone (series 5/image 42) and 1.3 cm in the left L3 vertebral body (series 5/image 33). IMPRESSION: 1. Numerous (greater than 15) enhancing liver metastases scattered throughout the liver. 2. Numerous small enhancing bone lesions throughout the visualized axial skeleton, suspicious for widespread bone metastases. These lesions were PET-CT occult and could be further evaluated with dedicated spine MRI without and with IV contrast as clinically warranted. 3. Right adrenal 1.7 cm adenoma. 4. Small dependent right pleural effusion. 5.  Aortic Atherosclerosis (ICD10-I70.0). Electronically Signed   By: Ilona Sorrel M.D.   On: 04/11/2020 10:04   NM PET Image Initial (PI) Skull Base To Thigh  Result Date: 03/28/2020 CLINICAL DATA:  Initial treatment strategy for right lung mass. EXAM: NUCLEAR MEDICINE PET SKULL BASE TO THIGH TECHNIQUE: 12.7 mCi F-18 FDG was injected intravenously. Full-ring PET imaging was performed from the skull base to thigh after the radiotracer. CT data was obtained and used for attenuation correction and anatomic localization. Fasting blood glucose: 163 mg/dl COMPARISON:  CT chest 03/26/2020 and 09/25/2017. FINDINGS: Mediastinal blood pool activity: SUV max 3.1 Liver activity: SUV max NA NECK: No abnormal hypermetabolism. Incidental CT findings: None. CHEST: Hypermetabolic 11 mm  right anterior scalene lymph node has an SUV max of 4.6. Hypermetabolic mediastinal lymph nodes extend to the contralateral left mediastinum where there is a 1.6 cm low left paratracheal lymph node (4/75) with an SUV 4.0. Index low right paratracheal lymph node measures 1.9 cm (4/68) with an SUV max of 7.0. Bulky subcarinal nodal mass measures 3.5 x 5.2 cm with an SUV max of 5.9. The borders of a right hilar mass are difficult to discern given postobstructive collapse/consolidation in the right upper and right middle lobes but, has an SUV max of 7.6. No hypermetabolic left hilar or axillary adenopathy. Incidental CT findings: Atherosclerotic calcification of the aorta and coronary arteries. Heart is at the upper limits of normal in size to mildly enlarged. No pericardial effusion. Trace right pleural effusion, decreased. As discussed on 03/26/2020, there is complete obstruction of the right upper and right middle lobe bronchi with postobstructive collapse/consolidation. Nodular consolidation, ground-glass and septal thickening are seen in the peripheral right upper lobe. ABDOMEN/PELVIS: There are subtle hypermetabolic low attenuation lesions in the liver. Index lesion in the dome of the liver measures approximately 1.9 cm (4/97) with an SUV max of 3.5. A second index lesion in  the inferior right hepatic lobe measures approximately 2.1 cm (4/126) with an SUV max of 4.5. There are additional suspicious hypoattenuating lesions in the liver bilaterally, many without definitive associated hypermetabolism. No abnormal hypermetabolism in adrenal glands, spleen or pancreas. No hypermetabolic lymph nodes. Incidental CT findings: Liver and gallbladder are otherwise unremarkable. Fluid density nodule in the left adrenal gland measures 2.1 cm. Left adrenal gland is unremarkable. Low-attenuation lesions in the right kidney measure up to 6.3 cm and are likely cysts. Left kidney, spleen, pancreas and stomach are unremarkable.  Duodenal diverticulum is incidentally noted. Small bowel and colon are otherwise grossly unremarkable. Prostate is mildly enlarged. SKELETON: Mild hypermetabolism overlying the greater trochanter of the left femur is likely inflammatory in etiology, as can be seen with bursitis. Otherwise, no additional abnormal hypermetabolism. Incidental CT findings: Degenerative changes in the spine. IMPRESSION: 1. Hypermetabolic obstructing right perihilar mass with hypermetabolic lymph nodes extending to the contralateral mediastinum and right anterior scalene nodal stations. Hypoattenuating lesions in the liver, some of which show abnormal hypermetabolism. 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 2. Nodular consolidation, ground-glass and septal thickening in the peripheral right upper lobe, indicative of lymphangitic carcinomatosis. 3. Trace right pleural effusion, decreased from 2 days prior. 4. Right adrenal adenoma. 5. Mildly enlarged prostate. 6. Aortic atherosclerosis (ICD10-I70.0). Coronary artery calcification. Electronically Signed   By: Lorin Picket M.D.   On: 03/28/2020 09:11   DG Chest Portable 2 Views  Result Date: 03/14/2020 CLINICAL DATA:  Cough and short of breath EXAM: CHEST  2 VIEW PORTABLE COMPARISON:  CT 09/25/2017 FINDINGS: Right upper lobe consolidative process. No pleural effusion. Borderline cardiomegaly. No pneumothorax. IMPRESSION: Right upper lobe consolidative process, may reflect pneumonia in the appropriate clinical setting. Radiographic follow-up to resolution is advised as potential lung mass could also produce this appearance. If more urgent imaging is desired or clinical symptoms are not consistent with pneumonia, contrast-enhanced chest CT could be obtained for further evaluation. Electronically Signed   By: Donavan Foil M.D.   On: 03/14/2020 18:45   [place] CORE BIOPSY (LYMPH NODES)  Result Date: 04/09/2020 INDICATION: Concern for metastatic lung  cancer. Please perform ultrasound-guided right supraclavicular lymph node biopsy for tissue diagnostic purposes. EXAM: ULTRASOUND-GUIDED RIGHT SUPRACLAVICULAR BIOPSY COMPARISON:  PET-CT-03/28/2020; chest CT-03/26/2020 MEDICATIONS: None ANESTHESIA/SEDATION: Moderate (conscious) sedation was employed during this procedure. A total of Versed 1 mg and Fentanyl 50 mcg was administered intravenously. Moderate Sedation Time: 10 minutes. The patient's level of consciousness and vital signs were monitored continuously by radiology nursing throughout the procedure under my direct supervision. COMPLICATIONS: None immediate. TECHNIQUE: Informed written consent was obtained from the patient after a discussion of the risks, benefits and alternatives to treatment. Questions regarding the procedure were encouraged and answered. Initial ultrasound scanning demonstrated several clustered right supraclavicular lymph nodes compatible with the hypermetabolic supraclavicular lymph nodes seen on preceding PET-CT. Dominant approximately 1.4 x 1.4 cm right supraclavicular lymph node, likely correlating with the lymph node seen on preceding PET-CT image 50, series 4, was targeted for biopsy given location and sonographic window. An ultrasound image was saved for documentation purposes. The procedure was planned. A timeout was performed prior to the initiation of the procedure. The operative was prepped and draped in the usual sterile fashion, and a sterile drape was applied covering the operative field. A timeout was performed prior to the initiation of the procedure. Local anesthesia was provided with 1% lidocaine with epinephrine. Under direct ultrasound guidance, an 18 gauge core needle  device was utilized to obtain to obtain 6 core needle biopsies of the dominant right supraclavicular lymph node. The samples were placed in saline and submitted to pathology. The needle was removed and hemostasis was achieved with manual compression. Post  procedure scan was negative for significant hematoma. A dressing was placed. The patient tolerated the procedure well without immediate postprocedural complication. IMPRESSION: Technically successful ultrasound guided biopsy of dominant right supraclavicular lymph node. Electronically Signed   By: Sandi Mariscal M.D.   On: 04/09/2020 14:26    ASSESSMENT AND PLAN: This is a very pleasant 69 years old white male recently diagnosed with extensive stage (T4, N3, M1 C) small cell lung cancer presented with large right upper lobe lung mass in addition to mediastinal and right supraclavicular lymphadenopathy and multiple metastatic liver lesions diagnosed in April 2021. The patient is currently undergoing palliative radiotherapy to the large right upper lobe obstructive mass. I had a lengthy discussion with the patient and his wife today about his current disease stage, prognosis and treatment options.  The patient understands that he has incurable condition and all the treatment will be of palliative nature. I discussed with the patient the option of treatment with systemic chemotherapy with carboplatin for AUC of 5 on day 1, etoposide 100 mg/M2 on days 1, 2 and 3 in addition to Cosela for myeloprotection during the days of the chemotherapy.  He will also receive immunotherapy with Imfinzi on day one of the chemotherapy cycle. The patient was also given the option of palliative care and hospice referral.  He is interested in proceeding with systemic chemotherapy and expected to start the first cycle of this treatment on Apr 23, 2020. I discussed with the patient the adverse effect of this treatment including but not limited to alopecia, myelosuppression, nausea and vomiting, peripheral neuropathy, liver or renal dysfunction as well as immunotherapy adverse effects. I will arrange for the patient to have Port-A-Cath placed before the first cycle of his treatment. I will also arrange for the patient to have a  chemotherapy education class before the first dose of his treatment. I will call his pharmacy with prescription for Compazine 10 mg p.o. every 6 hours as needed for nausea in addition to EMLA cream. The patient will come back for follow-up visit 1 week after the first dose of his treatment for evaluation and management of any adverse effect of his treatment. The patient was advised to call immediately if he has any concerning symptoms in the interval. The patient voices understanding of current disease status and treatment options and is in agreement with the current care plan.  All questions were answered. The patient knows to call the clinic with any problems, questions or concerns. We can certainly see the patient much sooner if necessary.  The total time spent in the appointment was 50 minutes.  Disclaimer: This note was dictated with voice recognition software. Similar sounding words can inadvertently be transcribed and may not be corrected upon review.

## 2020-04-12 NOTE — Progress Notes (Signed)
START ON PATHWAY REGIMEN - Small Cell Lung     Cycles 1 through 4: A cycle is every 21 days:     Durvalumab      Carboplatin      Etoposide    Cycles 5 and beyond: A cycle is every 28 days:     Durvalumab   **Always confirm dose/schedule in your pharmacy ordering system**  Patient Characteristics: Newly Diagnosed, Preoperative or Nonsurgical Candidate (Clinical Staging), First Line, Extensive Stage Therapeutic Status: Newly Diagnosed, Preoperative or Nonsurgical Candidate (Clinical Staging) AJCC T Category: cT4 AJCC N Category: cN3 AJCC M Category: cM1c AJCC 8 Stage Grouping: IVB Stage Classification: Extensive Intent of Therapy: Non-Curative / Palliative Intent, Discussed with Patient

## 2020-04-12 NOTE — Telephone Encounter (Signed)
Scheduled appt per 4/29 los.  Printed calendar per pt request

## 2020-04-13 ENCOUNTER — Other Ambulatory Visit: Payer: Self-pay | Admitting: Primary Care

## 2020-04-13 ENCOUNTER — Other Ambulatory Visit: Payer: Self-pay

## 2020-04-13 ENCOUNTER — Ambulatory Visit
Admission: RE | Admit: 2020-04-13 | Discharge: 2020-04-13 | Disposition: A | Discharge status: Home / Self Care | Payer: Medicare Other | Source: Ambulatory Visit | Attending: Radiation Oncology | Admitting: Radiation Oncology

## 2020-04-13 DIAGNOSIS — C3411 Malignant neoplasm of upper lobe, right bronchus or lung: Secondary | ICD-10-CM

## 2020-04-13 DIAGNOSIS — Z510 Encounter for antineoplastic radiation therapy: Secondary | ICD-10-CM | POA: Diagnosis not present

## 2020-04-13 MED ORDER — SONAFINE EX EMUL
1.0000 "application " | Freq: Once | CUTANEOUS | Status: AC
  Administered 2020-04-13: 1 via TOPICAL

## 2020-04-13 NOTE — Progress Notes (Signed)
Pt here for patient teaching.  Pt given Radiation and You booklet, skin care instructions and Sonafine.  Reviewed areas of pertinence such as fatigue, hair loss, skin changes, throat changes, cough and shortness of breath . Pt able to give teach back of to pat skin and use unscented/gentle soap,apply Sonafine bid and avoid applying anything to skin within 4 hours of treatment. Pt verbalizes understanding of information given and will contact nursing with any questions or concerns.    Gloriajean Dell. Leonie Green, BSN

## 2020-04-16 ENCOUNTER — Other Ambulatory Visit: Payer: Self-pay | Admitting: Student

## 2020-04-16 ENCOUNTER — Other Ambulatory Visit: Payer: Self-pay

## 2020-04-16 ENCOUNTER — Ambulatory Visit
Admission: RE | Admit: 2020-04-16 | Discharge: 2020-04-16 | Disposition: A | Discharge status: Home / Self Care | Payer: Medicare Other | Source: Ambulatory Visit | Attending: Radiation Oncology | Admitting: Radiation Oncology

## 2020-04-16 DIAGNOSIS — Z510 Encounter for antineoplastic radiation therapy: Secondary | ICD-10-CM | POA: Insufficient documentation

## 2020-04-16 DIAGNOSIS — C3411 Malignant neoplasm of upper lobe, right bronchus or lung: Secondary | ICD-10-CM | POA: Insufficient documentation

## 2020-04-17 ENCOUNTER — Ambulatory Visit (HOSPITAL_COMMUNITY)
Admission: RE | Admit: 2020-04-17 | Discharge: 2020-04-17 | Disposition: A | Discharge status: Home / Self Care | Payer: Medicare Other | Source: Ambulatory Visit | Attending: Internal Medicine | Admitting: Internal Medicine

## 2020-04-17 ENCOUNTER — Other Ambulatory Visit: Payer: Self-pay | Admitting: Internal Medicine

## 2020-04-17 ENCOUNTER — Ambulatory Visit
Admission: RE | Admit: 2020-04-17 | Discharge: 2020-04-17 | Disposition: A | Discharge status: Home / Self Care | Payer: Medicare Other | Source: Ambulatory Visit | Attending: Radiation Oncology | Admitting: Radiation Oncology

## 2020-04-17 ENCOUNTER — Encounter (HOSPITAL_COMMUNITY): Payer: Self-pay

## 2020-04-17 ENCOUNTER — Inpatient Hospital Stay: Payer: Medicare Other

## 2020-04-17 DIAGNOSIS — Z7982 Long term (current) use of aspirin: Secondary | ICD-10-CM | POA: Diagnosis not present

## 2020-04-17 DIAGNOSIS — Z96653 Presence of artificial knee joint, bilateral: Secondary | ICD-10-CM | POA: Diagnosis not present

## 2020-04-17 DIAGNOSIS — Z79899 Other long term (current) drug therapy: Secondary | ICD-10-CM | POA: Insufficient documentation

## 2020-04-17 DIAGNOSIS — G4733 Obstructive sleep apnea (adult) (pediatric): Secondary | ICD-10-CM | POA: Diagnosis not present

## 2020-04-17 DIAGNOSIS — Z7984 Long term (current) use of oral hypoglycemic drugs: Secondary | ICD-10-CM | POA: Insufficient documentation

## 2020-04-17 DIAGNOSIS — K219 Gastro-esophageal reflux disease without esophagitis: Secondary | ICD-10-CM | POA: Diagnosis not present

## 2020-04-17 DIAGNOSIS — I10 Essential (primary) hypertension: Secondary | ICD-10-CM | POA: Diagnosis not present

## 2020-04-17 DIAGNOSIS — Z87891 Personal history of nicotine dependence: Secondary | ICD-10-CM | POA: Diagnosis not present

## 2020-04-17 DIAGNOSIS — Z510 Encounter for antineoplastic radiation therapy: Secondary | ICD-10-CM | POA: Diagnosis not present

## 2020-04-17 DIAGNOSIS — C3490 Malignant neoplasm of unspecified part of unspecified bronchus or lung: Secondary | ICD-10-CM | POA: Insufficient documentation

## 2020-04-17 DIAGNOSIS — C3411 Malignant neoplasm of upper lobe, right bronchus or lung: Secondary | ICD-10-CM

## 2020-04-17 DIAGNOSIS — E119 Type 2 diabetes mellitus without complications: Secondary | ICD-10-CM | POA: Insufficient documentation

## 2020-04-17 HISTORY — PX: IR IMAGING GUIDED PORT INSERTION: IMG5740

## 2020-04-17 LAB — GLUCOSE, CAPILLARY: Glucose-Capillary: 138 mg/dL — ABNORMAL HIGH (ref 70–99)

## 2020-04-17 MED ORDER — FENTANYL CITRATE (PF) 100 MCG/2ML IJ SOLN
INTRAMUSCULAR | Status: AC
  Filled 2020-04-17: qty 2

## 2020-04-17 MED ORDER — HEPARIN SOD (PORK) LOCK FLUSH 100 UNIT/ML IV SOLN
INTRAVENOUS | Status: AC | PRN
  Administered 2020-04-17: 500 [IU] via INTRAVENOUS

## 2020-04-17 MED ORDER — SODIUM CHLORIDE 0.9 % IV SOLN: INTRAVENOUS | Status: DC

## 2020-04-17 MED ORDER — LIDOCAINE-EPINEPHRINE 1 %-1:100000 IJ SOLN
INTRAMUSCULAR | Status: AC
  Filled 2020-04-17: qty 1

## 2020-04-17 MED ORDER — MIDAZOLAM HCL 2 MG/2ML IJ SOLN
INTRAMUSCULAR | Status: AC
  Filled 2020-04-17: qty 2

## 2020-04-17 MED ORDER — HEPARIN SOD (PORK) LOCK FLUSH 100 UNIT/ML IV SOLN
INTRAVENOUS | Status: AC
  Filled 2020-04-17: qty 5

## 2020-04-17 MED ORDER — CEFAZOLIN SODIUM-DEXTROSE 2-4 GM/100ML-% IV SOLN
2.0000 g | INTRAVENOUS | Status: AC
  Administered 2020-04-17: 2 g via INTRAVENOUS

## 2020-04-17 MED ORDER — LIDOCAINE-EPINEPHRINE (PF) 1 %-1:200000 IJ SOLN
INTRAMUSCULAR | Status: AC | PRN
  Administered 2020-04-17: 20 mL

## 2020-04-17 MED ORDER — FENTANYL CITRATE (PF) 100 MCG/2ML IJ SOLN
INTRAMUSCULAR | Status: AC | PRN
  Administered 2020-04-17: 50 ug via INTRAVENOUS
  Administered 2020-04-17 (×2): 25 ug via INTRAVENOUS

## 2020-04-17 MED ORDER — SODIUM CHLORIDE 0.9 % IV SOLN
INTRAVENOUS | Status: AC | PRN
  Administered 2020-04-17: 10 mL/h via INTRAVENOUS

## 2020-04-17 MED ORDER — MIDAZOLAM HCL 2 MG/2ML IJ SOLN
INTRAMUSCULAR | Status: AC | PRN
  Administered 2020-04-17 (×2): 0.5 mg via INTRAVENOUS
  Administered 2020-04-17: 1 mg via INTRAVENOUS

## 2020-04-17 MED ORDER — CEFAZOLIN SODIUM-DEXTROSE 2-4 GM/100ML-% IV SOLN
INTRAVENOUS | Status: AC
  Filled 2020-04-17: qty 100

## 2020-04-17 NOTE — H&P (Signed)
Chief Complaint: Patient was seen in consultation today for [facility] a cath placement at the request of [facility]  Referring Physician(s): Mohamed,Mohamed  Supervising Physician: Daryll Brod  Patient Status: [facility] - Out-pt  History of Present Illness: Adam Wise is a 69 y.o. male   Newly diagnosed lung cancer 4/26 Bx: . LYMPH NODE, RIGHT SUPRACLAVICULAR, NEEDLE CORE BIOPSY:  - Metastatic small cell carcinoma.  Now scheduled for [place] a cath placement To start chemo therapy next week     Past Medical History:  Diagnosis Date   Chickenpox    Chronic knee pain    Chronic low back pain    Essential hypertension    GERD (gastroesophageal reflux disease)    OSA (obstructive sleep apnea)    Testosterone deficiency    Type 2 diabetes mellitus ([place])     Past Surgical History:  Procedure Laterality Date   COLONOSCOPY WITH PROPOFOL N/A 12/17/2018   Procedure: COLONOSCOPY WITH PROPOFOL;  Surgeon: Virgel Manifold, MD;  Location: [facility] ENDOSCOPY;  Service: Gastroenterology;  Laterality: N/A;   JOINT REPLACEMENT Bilateral    REPLACEMENT TOTAL KNEE BILATERAL  2015   TONSILLECTOMY  1960    Allergies: Bupropion  Medications: Prior to Admission medications   Medication Sig Start Date End Date Taking? Authorizing Provider  acetaminophen (TYLENOL) 500 MG tablet Take 1,500 mg by mouth every 8 (eight) hours as needed for moderate pain or headache.   Yes [provider]  albuterol (VENTOLIN HFA) 108 (90 Base) MCG/ACT inhaler Inhale 2 puffs into the lungs every 6 (six) hours as needed for wheezing or shortness of breath. 03/14/20  Yes Nicolette Bang, DO  amLODipine (NORVASC) 10 MG tablet TAKE 1 TABLET BY MOUTH EVERY DAY Patient taking differently: Take 10 mg by mouth daily.  02/03/20  Yes [name], NP  aspirin EC 81 MG tablet Take 81 mg by mouth daily.    Yes [provider]  Black Pepper-Turmeric (TURMERIC COMPLEX/BLACK  PEPPER PO) Take 1 tablet by mouth in the morning and at bedtime.   Yes [provider]  Coenzyme Q10 ([employer] COQ-10) 200 MG capsule Take 200 mg by mouth daily.   Yes [provider]  esomeprazole (NEXIUM) 20 MG capsule Take 20 mg by mouth daily.    Yes [provider]  fluticasone (FLONASE) 50 MCG/ACT nasal spray Place 2 sprays into both nostrils daily as needed for allergies.  03/16/17  Yes [provider]  gabapentin (NEURONTIN) 300 MG capsule Take 300 mg by mouth 2 (two) times daily.  10/03/19  Yes [provider]  glipiZIDE (GLUCOTROL) 10 MG tablet TAKE 1 TABLET (10 MG TOTAL) BY MOUTH 2 (TWO) TIMES DAILY BEFORE A MEAL. FOR DIABETES. 12/07/19  Yes [name], NP  HYDROcodone-homatropine [facility]) 5-1.5 MG/5ML syrup Take 5 mLs by mouth every 8 (eight) hours as needed for cough. Patient taking differently: Take 5 mLs by mouth at bedtime as needed for cough.  03/30/20  Yes Curt Bears, MD  KRILL OIL PO Take 350 mg by mouth daily.   Yes [provider]  Lancets MISC USE UP TO 3 TIMES DAILY AS DIRECTED 05/06/18  Yes [name], NP  lisinopril (ZESTRIL) 5 MG tablet TAKE 1 TABLET BY MOUTH EVERY DAY Patient taking differently: Take 5 mg by mouth daily.  02/03/20  Yes [name], NP  Melatonin 10 MG CAPS Take 10 mg by mouth at bedtime.   Yes [provider]  metFORMIN (GLUCOPHAGE) 1000 MG  tablet TAKE 1 TABLET (1,000 MG TOTAL) BY MOUTH 2 (TWO) TIMES DAILY WITH A MEAL. 03/22/20  Yes [name], NP  Multiple Vitamin (MULTI-VITAMINS) TABS Take 1 tablet by mouth daily.    Yes [provider]  ONETOUCH ULTRA test strip USE UP TO 4 TIMES DAILY AS DIRECTED 02/07/20  Yes [name], NP  oxymetazoline (AFRIN) 0.05 % nasal spray Place 1 spray into both nostrils 2 (two) times daily as needed for congestion.   Yes [provider]  pioglitazone (ACTOS) 45 MG tablet TAKE 1 TABLET BY MOUTH EVERY DAY Patient  taking differently: Take 45 mg by mouth daily.  02/03/20  Yes [name], NP  pravastatin (PRAVACHOL) 40 MG tablet TAKE 1 TABLET BY MOUTH EVERY DAY IN THE EVENING FOR CHOLESTEROL Patient taking differently: Take 40 mg by mouth daily.  02/03/20  Yes [name], NP  SYRINGE-NEEDLE, DISP, 3 ML (B-D 3CC LUER-LOK SYR 22GX1") 22G X 1" 3 ML MISC USE AS INSTRUCTED FOR TESTOSTERONE INJECTION EVERY 2 WEEKS 04/14/20  Yes [name], NP  testosterone cypionate (DEPOTESTOSTERONE CYPIONATE) 200 MG/ML injection Inject 200 mg into the muscle every 14 (fourteen) days.   Yes [provider]  blood glucose meter kit and supplies KIT Dispense based on patient and insurance preference. Use up to four times daily as directed. 04/09/18   [name], NP  sildenafil (VIAGRA) 50 MG tablet TAKE 1 TABLET BY MOUTH EVERY DAY AS NEEDED Patient taking differently: Take 50 mg by mouth as needed for erectile dysfunction.  09/06/18   [name], NP     Family History  Problem Relation Age of Onset   Cancer Mother    Hypertension Mother    Arthritis Father    Asthma Father    Cancer Father    COPD Father    Heart attack Father    Hypertension Sister    Cancer Sister    Diabetes Sister    Asthma Son    Birth defects Maternal Grandfather    Arthritis Paternal Grandmother    Diabetes Paternal Grandmother    Arthritis Paternal Grandfather    Asthma Sister    Cancer Sister    COPD Sister    Arthritis Sister    Asthma Sister    Diabetes Sister    Other Neg Hx        pituitary disorder    Social History   Socioeconomic History   Marital status: Married    Spouse name: Not on file   Number of children: Not on file   Years of education: Not on file   Highest education level: Not on file  Occupational History   Not on file  Tobacco Use   Smoking status: Former Smoker   Smokeless tobacco: Never Used  Substance and Sexual Activity   Alcohol  use: Yes    Comment: rarely   Drug use: Not Currently   Sexual activity: Yes  Other Topics Concern   Not on file  Social History Narrative   Married.   Moved from [place].   Retired.   Social Determinants of Health   Financial Resource Strain: Low Risk    Difficulty of Paying Living Expenses: Not hard at all  Food Insecurity: No Food Insecurity   Worried About Charity fundraiser in the Last Year: Never true   Ran Out of Food in the Last Year: Never true  Transportation Needs: No Transportation Needs   Lack of  Transportation (Medical): No   Lack of Transportation (Non-Medical): No  Physical Activity: Inactive   Days of Exercise per Week: 0 days   Minutes of Exercise per Session: 0 min  Stress: No Stress Concern Present   Feeling of Stress : Not at all  Social Connections:    Frequency of Communication with Friends and Family:    Frequency of Social Gatherings with Friends and Family:    Attends Religious Services:    Active Member of Clubs or Organizations:    Attends Archivist Meetings:    Marital Status:     Review of Systems: A 12 point ROS discussed and pertinent positives are indicated in the HPI above.  All other systems are negative.  Review of Systems  Constitutional: Negative for fatigue and fever.  Respiratory: Positive for cough. Negative for shortness of breath.   Cardiovascular: Negative for chest pain.  Neurological: Negative for weakness.  Psychiatric/Behavioral: Negative for behavioral problems and confusion.    Vital Signs: BP (!) 131/98    Pulse 89    Temp 98.2 F (36.8 C)    Resp 17    Ht '5\' 9"'$  (1.753 m)    Wt 255 lb (115.7 kg)    SpO2 93%    BMI 37.66 kg/m   Physical Exam Vitals reviewed.  Cardiovascular:     Rate and Rhythm: Normal rate and regular rhythm.     Heart sounds: Normal heart sounds.  Pulmonary:     Effort: Pulmonary effort is normal.     Breath sounds: Normal breath sounds.  Abdominal:     Palpations:  Abdomen is soft.  Musculoskeletal:        General: Normal range of motion.  Skin:    General: Skin is warm and dry.  Neurological:     Mental Status: He is alert and oriented to person, place, and time.  Psychiatric:        Behavior: Behavior normal.        Thought Content: Thought content normal.        Judgment: Judgment normal.     Imaging: CT Chest W Contrast  Result Date: 03/26/2020 CLINICAL DATA:  Persistent cough, congestion wheezing and shortness of breath. Negative COVID test. Two rounds of antibiotics without improvement. Consolidation on chest x-ray. EXAM: CT CHEST WITH CONTRAST TECHNIQUE: Multidetector CT imaging of the chest was performed during intravenous contrast administration. CONTRAST:  5m OMNIPAQUE IOHEXOL 300 MG/ML  SOLN COMPARISON:  Chest radiograph 03/14/2020. FINDINGS: Cardiovascular: Atherosclerotic calcification of the aorta and coronary arteries. Pulmonic trunk is enlarged. Right pulmonary artery branches are encased and narrowed. There is narrowing of the SVC by an adjacent mass. Heart size normal. No pericardial effusion. Mediastinum/Nodes: Low right internal jugular and anterior scalene lymph nodes measure up to 1.5 cm (2/7). Bulky mediastinal adenopathy. Index low right paratracheal mass measures approximately 2.9 x 4.3 cm. Subcarinal nodal mass measures 3.5 x 5.1 cm. Low left paratracheal lymph node measures 1.4 cm and a high AP window lymph node measures 7 mm, indicating contralateral mediastinal involvement. There is a right hilar mass/adenopathy encasing the right pulmonary artery branches and obstructing the right upper and middle lobe bronchi. Postobstructive collapse/consolidation in the right upper and right middle lobes makes measurement difficult. Septal thickening and nodularity are seen in the right upper lobe is well. No axillary adenopathy. Prepericardiac lymph nodes measure up to 1.6 cm. Esophagus is grossly unremarkable. Lungs/Pleura: Right upper  lobe/right hilar mass obstructs the right upper and middle lobe  bronchi. There is postobstructive collapse/consolidation in the right upper lobe with septal thickening and nodularity. Complete collapse/consolidation involving the right middle lobe. Mass also encases and narrows the right pulmonary artery branches and severely narrows bronchus intermedius. Small right pleural effusion. 2 mm left upper lobe nodule (3/53) unchanged and benign. Minimal subpleural scarring in the left lower lobe. No left pleural fluid. Upper Abdomen: Visualized portion of the liver is unremarkable. 1.7 cm right adrenal nodule measures 31 Hounsfield units but is stable from 09/25/2017. Visualized portion of the left adrenal gland is unremarkable. Exophytic low-attenuation lesion off the upper pole right kidney measures at least 3.2 cm, partially imaged. Visualized portions of the kidneys, spleen, pancreas, stomach and bowel are otherwise unremarkable. Musculoskeletal: Degenerative changes in the spine. No worrisome lytic or sclerotic lesions. IMPRESSION: 1. 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 Associated bulky mediastinal and subcarinal adenopathy and small right pleural effusion. Findings are indicative of primary bronchogenic carcinoma and at least T4 N3 M0 or stage IIIC disease. If the right pleural effusion is malignant, findings are consistent with stage IV lung cancer. 2. Right adrenal nodule is stable and therefore indicative of an adenoma. 3. Aortic atherosclerosis (ICD10-I70.0). Coronary artery calcification. 4. Enlarged pulmonic trunk with encasement and narrowing of the right upper lobe pulmonary arterial branches. Electronically Signed   By: Lorin Picket M.D.   On: 03/26/2020 12:24   MR BRAIN W WO CONTRAST  Result Date: 04/04/2020 CLINICAL DATA:  Malignant neoplasm of  unspecified part of unspecified bronchus or lung. Non-small cell lung cancer, staging. Additional history provided by scanning technologist: Stage IV lung cancer, evaluate for metastases. EXAM: MRI HEAD WITHOUT AND WITH CONTRAST TECHNIQUE: Multiplanar, multiecho pulse sequences of the brain and surrounding structures were obtained without and with intravenous contrast. CONTRAST:  62m GADAVIST GADOBUTROL 1 MMOL/ML IV SOLN COMPARISON:  Prior brain MRI examinations 10/19/2019 and earlier. FINDINGS: Brain: Postcontrast imaging is mildly motion degraded. Subcentimeter symmetric nodular enhancing foci at the level of the foramen magnum have remained stable dating back to prior MRI 10/15/2017 and are favored benign. No abnormal enhancement is identified elsewhere within the brain to suggest intracranial metastatic disease. A known 4 mm left intrasellar cyst is suboptimally assessed on this non pituitary protocol brain MRI, but appears unchanged as compared to 10/19/2019 (series 17, image 18). Stable mild patchy T2/FLAIR hyperintensity within the cerebral white matter which is nonspecific, but consistent with chronic small vessel ischemic disease. Stable small arachnoid cyst along the right insula/sylvian fissure. There is no evidence of acute infarct. There is no midline shift or extra-axial fluid collection. No chronic intracranial blood products. Cerebral volume is normal for age. Vascular: Flow voids maintained within the proximal large arterial vessels. Skull and upper cervical spine: No focal marrow lesion. Sinuses/Orbits: Visualized orbits demonstrate no acute abnormality. Mild ethmoid sinus mucosal thickening. Tiny right maxillary sinus mucous retention cyst. No significant mastoid effusion. IMPRESSION: 1. Mildly motion degraded post-contrast imaging. 2. Symmetric subcentimeter nodular enhancing foci at the level of the foramen magnum have remained stable dating back to MRI 10/15/2017 and are favored benign. No  abnormal intracranial enhancement is identified elsewhere to suggest metastatic disease. 3. Unchanged 4 mm left intrasellar cyst. Differential considerations include arachnoid cyst or cystic pituitary adenoma. 4. Stable, mild chronic small vessel ischemic changes within the cerebral white matter. Electronically Signed   By: KKellie SimmeringDO  On: 04/04/2020 11:24   MR LIVER W WO CONTRAST  Result Date: 04/11/2020 CLINICAL DATA:  Primary Cancer Type: Lung Imaging Indication: Initial staging, evaluate low-attenuation liver lesions on recent PET-CT Initial Cancer Diagnosis Date: 03/28/2020 Established by: Biopsy-proven Detailed Pathology: Right supraclavicular lymph node biopsy 04/09/2020: Metastatic small cell carcinoma. Primary Tumor location:  Laterality: Right Cancer Specific: Non-small cell stage IV lung cancer. Large right upper lobe/right hilar mass. EXAM: MRI ABDOMEN WITHOUT AND WITH CONTRAST TECHNIQUE: Multiplanar multisequence MR imaging of the abdomen was performed both before and after the administration of intravenous contrast. CONTRAST:  75m GADAVIST GADOBUTROL 1 MMOL/ML IV SOLN COMPARISON:  03/28/2020 PET-CT. FINDINGS: Lower chest: Partially visualized known right perihilar lung mass. Small dependent right pleural effusion. Hepatobiliary: Normal liver size. No significant hepatic steatosis. There are numerous (greater than 15) similar T2 hyperintense liver masses scattered throughout the liver with targetoid enhancement with representative 3.0 x 2.9 cm segment 5 right liver mass (series 19/image 46), 2.8 x 2.1 cm caudate lobe mass (series 19/image 26) and 2.4 x 1.9 cm segment 8 right liver mass (series 19/image 19). Normal gallbladder with no cholelithiasis. No biliary ductal dilatation. Common bile duct diameter 3 mm. No choledocholithiasis. No biliary masses, strictures or beading. Small to moderate periampullary duodenal diverticulum. Pancreas: No pancreatic mass or duct dilation. 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 Spleen: Normal size. No mass. Adrenals/Urinary Tract: Right adrenal 1.7 cm adenoma, demonstrating significant loss of signal intensity on out of phase chemical shift imaging. No left adrenal nodules. No hydronephrosis. Numerous simple right greater than left renal cysts, largest 6.5 cm in the interpolar right kidney. No suspicious renal masses. Stomach/Bowel: Normal non-distended stomach. Visualized small and large bowel is normal caliber, with no bowel wall thickening. Vascular/Lymphatic: Atherosclerotic nonaneurysmal abdominal aorta. Patent portal, splenic, hepatic and renal veins. No pathologically enlarged lymph nodes in the abdomen. Other: No abdominal ascites or focal fluid collection. Musculoskeletal: Numerous small enhancing T2 hyperintense osseous lesions throughout the visualized axial skeleton, for example measuring 2.1 cm in the medial right iliac bone (series 5/image 42) and 1.3 cm in the left L3 vertebral body (series 5/image 33). IMPRESSION: 1. Numerous (greater than 15) enhancing liver metastases scattered throughout the liver. 2. Numerous small enhancing bone lesions throughout the visualized axial skeleton, suspicious for widespread bone metastases. These lesions were PET-CT occult and could be further evaluated with dedicated spine MRI without and with IV contrast as clinically warranted. 3. Right adrenal 1.7 cm adenoma. 4. Small dependent right pleural effusion. 5.  Aortic Atherosclerosis (ICD10-I70.0). Electronically Signed   By: JIlona SorrelM.D.   On: 04/11/2020 10:04   NM PET Image Initial (PI) Skull Base To Thigh  Result Date: 03/28/2020 CLINICAL DATA:  Initial treatment strategy for right lung mass. EXAM: NUCLEAR MEDICINE PET SKULL BASE TO THIGH TECHNIQUE: 12.7 mCi F-18 FDG was injected intravenously. Full-ring PET imaging was performed from the skull base to thigh after the radiotracer. CT data was obtained and used for attenuation correction and anatomic localization. Fasting  blood glucose: 163 mg/dl COMPARISON:  CT chest 03/26/2020 and 09/25/2017. FINDINGS: Mediastinal blood pool activity: SUV max 3.1 Liver activity: SUV max NA NECK: No abnormal hypermetabolism. Incidental CT findings: None. CHEST: Hypermetabolic 11 mm right anterior scalene lymph node has an SUV max of 4.6. Hypermetabolic mediastinal lymph nodes extend to the contralateral left mediastinum where there is a 1.6 cm low left paratracheal lymph node (4/75) with an SUV 4.0. Index low right paratracheal lymph node measures 1.9 cm (4/68) with  an SUV max of 7.0. Bulky subcarinal nodal mass measures 3.5 x 5.2 cm with an SUV max of 5.9. The borders of a right hilar mass are difficult to discern given postobstructive collapse/consolidation in the right upper and right middle lobes but, has an SUV max of 7.6. No hypermetabolic left hilar or axillary adenopathy. Incidental CT findings: Atherosclerotic calcification of the aorta and coronary arteries. Heart is at the upper limits of normal in size to mildly enlarged. No pericardial effusion. Trace right pleural effusion, decreased. As discussed on 03/26/2020, there is complete obstruction of the right upper and right middle lobe bronchi with postobstructive collapse/consolidation. Nodular consolidation, ground-glass and septal thickening are seen in the peripheral right upper lobe. ABDOMEN/PELVIS: There are subtle hypermetabolic low attenuation lesions in the liver. Index lesion in the dome of the liver measures approximately 1.9 cm (4/97) with an SUV max of 3.5. A second index lesion in the inferior right hepatic lobe measures approximately 2.1 cm (4/126) with an SUV max of 4.5. There are additional suspicious hypoattenuating lesions in the liver bilaterally, many without definitive associated hypermetabolism. No abnormal hypermetabolism in adrenal glands, spleen or pancreas. No hypermetabolic lymph nodes. Incidental CT findings: Liver and gallbladder are otherwise unremarkable.  Fluid density nodule in the left adrenal gland measures 2.1 cm. Left adrenal gland is unremarkable. Low-attenuation lesions in the right kidney measure up to 6.3 cm and are likely cysts. Left kidney, spleen, pancreas and stomach are unremarkable. Duodenal diverticulum is incidentally noted. Small bowel and colon are otherwise grossly unremarkable. Prostate is mildly enlarged. SKELETON: Mild hypermetabolism overlying the greater trochanter of the left femur is likely inflammatory in etiology, as can be seen with bursitis. Otherwise, no additional abnormal hypermetabolism. Incidental CT findings: Degenerative changes in the spine. IMPRESSION: 1. Hypermetabolic obstructing right perihilar mass with hypermetabolic lymph nodes extending to the contralateral mediastinum and right anterior scalene nodal stations. Hypoattenuating lesions in the liver, some of which show abnormal hypermetabolism. 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 2. Nodular consolidation, ground-glass and septal thickening in the peripheral right upper lobe, indicative of lymphangitic carcinomatosis. 3. Trace right pleural effusion, decreased from 2 days prior. 4. Right adrenal adenoma. 5. Mildly enlarged prostate. 6. Aortic atherosclerosis (ICD10-I70.0). Coronary artery calcification. Electronically Signed   By: Lorin Picket M.D.   On: 03/28/2020 09:11   [place] CORE BIOPSY (LYMPH NODES)  Result Date: 04/09/2020 INDICATION: Concern for metastatic lung cancer. Please perform ultrasound-guided right supraclavicular lymph node biopsy for tissue diagnostic purposes. EXAM: ULTRASOUND-GUIDED RIGHT SUPRACLAVICULAR BIOPSY COMPARISON:  PET-CT-03/28/2020; chest CT-03/26/2020 MEDICATIONS: None ANESTHESIA/SEDATION: Moderate (conscious) sedation was employed during this procedure. A total of Versed 1 mg and Fentanyl 50 mcg was administered intravenously. Moderate Sedation Time: 10 minutes. The patient's level of consciousness  and vital signs were monitored continuously by radiology nursing throughout the procedure under my direct supervision. COMPLICATIONS: None immediate. TECHNIQUE: Informed written consent was obtained from the patient after a discussion of the risks, benefits and alternatives to treatment. Questions regarding the procedure were encouraged and answered. Initial ultrasound scanning demonstrated several clustered right supraclavicular lymph nodes compatible with the hypermetabolic supraclavicular lymph nodes seen on preceding PET-CT. Dominant approximately 1.4 x 1.4 cm right supraclavicular lymph node, likely correlating with the lymph node seen on preceding PET-CT image 50, series 4, was targeted for biopsy given location and sonographic window. An ultrasound image was saved for documentation purposes. The procedure was planned. A timeout was performed prior to the initiation of the procedure. The  operative was prepped and draped in the usual sterile fashion, and a sterile drape was applied covering the operative field. A timeout was performed prior to the initiation of the procedure. Local anesthesia was provided with 1% lidocaine with epinephrine. Under direct ultrasound guidance, an 18 gauge core needle device was utilized to obtain to obtain 6 core needle biopsies of the dominant right supraclavicular lymph node. The samples were placed in saline and submitted to pathology. The needle was removed and hemostasis was achieved with manual compression. Post procedure scan was negative for significant hematoma. A dressing was placed. The patient tolerated the procedure well without immediate postprocedural complication. IMPRESSION: Technically successful ultrasound guided biopsy of dominant right supraclavicular lymph node. Electronically Signed   By: Sandi Mariscal M.D.   On: 04/09/2020 14:26    Labs:  CBC: Recent Labs    03/26/20 0936 03/30/20 0956  WBC 7.5 10.3  HGB 15.3 15.5  HCT 46.2 48.1  PLT 176.0 216     COAGS: No results for input(s): INR, APTT in the last 8760 hours.  BMP: Recent Labs    11/01/19 0846 03/26/20 0936 03/26/20 1123 03/30/20 0956  NA 141 137  --  142  K 4.7 5.7 slight hemolysis*  --  4.9  CL 104 99  --  99  CO2 29 32  --  27  GLUCOSE 158* 141*  --  257*  BUN 15 15  --  25*  CALCIUM 9.2 9.8  --  9.3  CREATININE 1.19 1.31 1.26* 1.47*  GFRNONAA  --   --  58* 48*  GFRAA  --   --  >60 56*    LIVER FUNCTION TESTS: Recent Labs    11/01/19 0846 03/30/20 0956  BILITOT 0.3 0.4  AST 23 17  ALT 22 21  ALKPHOS 62 78  PROT 6.7 6.9  ALBUMIN 4.4 3.6    TUMOR MARKERS: No results for input(s): AFPTM, CEA, CA199, CHROMGRNA in the last 8760 hours.  Assessment and Plan:  Newly dx lung cancer For PAC placement in IR today To start chemo therapy next week Risks and benefits of image guided port-a-catheter placement was discussed with the patient including, but not limited to bleeding, infection, pneumothorax, or fibrin sheath development and need for additional procedures.  All of the patient's questions were answered, patient is agreeable to proceed. Consent signed and in chart.   Thank you for this interesting consult.  I greatly enjoyed meeting Christino Mcglinchey and look forward to participating in their care.  A copy of this report was sent to the requesting provider on this date.  Electronically Signed: Lavonia Drafts, PA-C 04/17/2020, 11:45 AM   I spent a total of    25 Minutes in face to face in clinical consultation, greater than 50% of which was counseling/coordinating care for [facility] placement

## 2020-04-17 NOTE — Procedures (Signed)
Pre Procedure Dx: Lung cancer Post Procedural Dx: Same  Successful placement of right IJ approach port-a-cath with tip at the superior caval atrial junction. The catheter is ready for immediate use.  Estimated Blood Loss: Minimal  Complications: None immediate.  Jay Davionne Mastrangelo, MD Pager #: [phone]   

## 2020-04-17 NOTE — Progress Notes (Signed)
Discharge instructions reviewed with patient and family. Verbalized understanding. Pt will be discharged at 1415.

## 2020-04-17 NOTE — Progress Notes (Signed)
Pharmacist Chemotherapy Monitoring - Initial Assessment    Anticipated start date: 04/23/20   Regimen:  . Are orders appropriate based on the patient's diagnosis, regimen, and cycle? Yes . Does the plan date match the patient's scheduled date? Yes . Is the sequencing of drugs appropriate? Yes . Are the premedications appropriate for the patient's regimen? Yes . Prior Authorization for treatment is: Pending o If applicable, is the correct biosimilar selected based on the patient's insurance? not applicable  Organ Function and Labs: Marland Kitchen Are dose adjustments needed based on the patient's renal function, hepatic function, or hematologic function? No . Are appropriate labs ordered prior to the start of patient's treatment? Yes . Other organ system assessment, if indicated: N/A . The following baseline labs, if indicated, have been ordered: durvalumab: baseline TSH +/- T4  Dose Assessment: . Are the drug doses appropriate? Yes . Are the following correct: o Drug concentrations Yes o IV fluid compatible with drug Yes o Administration routes Yes o Timing of therapy Yes . If applicable, does the patient have documented access for treatment and/or plans for port-a-cath placement? yes . If applicable, have lifetime cumulative doses been properly documented and assessed? yes Lifetime Dose Tracking  No doses have been documented on this patient for the following tracked chemicals: Doxorubicin, Epirubicin, Idarubicin, Daunorubicin, Mitoxantrone, Bleomycin, Oxaliplatin, Carboplatin, Liposomal Doxorubicin  o   Toxicity Monitoring/Prevention: . The patient has the following take home antiemetics prescribed: N/A . The patient has the following take home medications prescribed: N/A . Medication allergies and previous infusion related reactions, if applicable, have been reviewed and addressed. Yes . The patient's current medication list has been assessed for drug-drug interactions with their chemotherapy  regimen. no significant drug-drug interactions were identified on review.  Order Review: . Are the treatment plan orders signed? Yes . Is the patient scheduled to see a provider prior to their treatment? No  I verify that I have reviewed each item in the above checklist and answered each question accordingly.  Romualdo Bolk [facility] 04/17/2020 11:22 AM

## 2020-04-17 NOTE — Discharge Instructions (Addendum)
Implanted Port Insertion, Care After This sheet gives you information about how to care for yourself after your procedure. Your health care provider may also give you more specific instructions. If you have problems or questions, contact your health care provider. What can I expect after the procedure? After the procedure, it is common to have: Discomfort at the port insertion site. Bruising on the skin over the port. This should improve over 3-4 days. Follow these instructions at home: [facility] care After your port is placed, you will get a manufacturer's information card. The card has information about your port. Keep this card with you at all times. Take care of the port as told by your health care provider. Ask your health care provider if you or a family member can get training for taking care of the port at home. A home health care nurse may also take care of the port. Make sure to remember what type of port you have. Incision care     Follow instructions from your health care provider about how to take care of your port insertion site. Make sure you: Wash your hands with soap and water before and after you change your bandage (dressing). If soap and water are not available, use hand sanitizer. Change your dressing as told by your health care provider. Leave stitches (sutures), skin glue, or adhesive strips in place. These skin closures may need to stay in place for 2 weeks or longer. If adhesive strip edges start to loosen and curl up, you may trim the loose edges. Do not remove adhesive strips completely unless your health care provider tells you to do that. Check your port insertion site every day for signs of infection. Check for: Redness, swelling, or pain. Fluid or blood. Warmth. Pus or a bad smell. Activity Return to your normal activities as told by your health care provider. Ask your health care provider what activities are safe for you. Do not lift anything that is heavier than  10 lb (4.5 kg), or the limit that you are told, until your health care provider says that it is safe. General instructions Take over-the-counter and prescription medicines only as told by your health care provider. Do not take baths, swim, or use a hot tub until your health care provider approves. Ask your health care provider if you may take showers. You may only be allowed to take sponge baths. Do not drive for 24 hours if you were given a sedative during your procedure. Wear a medical alert bracelet in case of an emergency. This will tell any health care providers that you have a port. Keep all follow-up visits as told by your health care provider. This is important. Contact a health care provider if: You cannot flush your port with saline as directed, or you cannot draw blood from the port. You have a fever or chills. You have redness, swelling, or pain around your port insertion site. You have fluid or blood coming from your port insertion site. Your port insertion site feels warm to the touch. You have pus or a bad smell coming from the port insertion site. Get help right away if: You have chest pain or shortness of breath. You have bleeding from your port that you cannot control. Summary Take care of the port as told by your health care provider. Keep the manufacturer's information card with you at all times. Change your dressing as told by your health care provider. Contact a health care provider if you have  a fever or chills or if you have redness, swelling, or pain around your port insertion site. Keep all follow-up visits as told by your health care provider. This information is not intended to replace advice given to you by your health care provider. Make sure you discuss any questions you have with your health care provider. Document Revised: 06/29/2018 Document Reviewed: 06/29/2018 Elsevier Patient Education  [place]. Moderate Conscious Sedation, Adult Sedation is  the use of medicines to promote relaxation and relieve discomfort and anxiety. Moderate conscious sedation is a type of sedation. Under moderate conscious sedation, you are less alert than normal, but you are still able to respond to instructions, touch, or both. Moderate conscious sedation is used during short medical and dental procedures. It is milder than deep sedation, which is a type of sedation under which you cannot be easily woken up. It is also milder than general anesthesia, which is the use of medicines to make you unconscious. Moderate conscious sedation allows you to return to your regular activities sooner. Tell a health care provider about:  Any allergies you have.  All medicines you are taking, including vitamins, herbs, eye drops, creams, and over-the-counter medicines.  Use of steroids (by mouth or creams).  Any problems you or family members have had with sedatives and anesthetic medicines.  Any blood disorders you have.  Any surgeries you have had.  Any medical conditions you have, such as sleep apnea.  Whether you are pregnant or may be pregnant.  Any use of cigarettes, alcohol, marijuana, or street drugs. What are the risks? Generally, this is a safe procedure. However, problems may occur, including:  Getting too much medicine (oversedation).  Nausea.  Allergic reaction to medicines.  Trouble breathing. If this happens, a breathing tube may be used to help with breathing. It will be removed when you are awake and breathing on your own.  Heart trouble.  Lung trouble. What happens before the procedure? Staying hydrated Follow instructions from your health care provider about hydration, which may include:  Up to 2 hours before the procedure - you may continue to drink clear liquids, such as water, clear fruit juice, black coffee, and plain tea. Eating and drinking restrictions Follow instructions from your health care provider about eating and drinking,  which may include:  8 hours before the procedure - stop eating heavy meals or foods such as meat, fried foods, or fatty foods.  6 hours before the procedure - stop eating light meals or foods, such as toast or cereal.  6 hours before the procedure - stop drinking milk or drinks that contain milk.  2 hours before the procedure - stop drinking clear liquids. Medicine Ask your health care provider about:  Changing or stopping your regular medicines. This is especially important if you are taking diabetes medicines or blood thinners.  Taking medicines such as aspirin and ibuprofen. These medicines can thin your blood. Do not take these medicines before your procedure if your health care provider instructs you not to.  Tests and exams  You will have a physical exam.  You may have blood tests done to show: ? How well your kidneys and liver are working. ? How well your blood can clot. General instructions  Plan to have someone take you home from the hospital or clinic.  If you will be going home right after the procedure, plan to have someone with you for 24 hours. What happens during the procedure?  An IV tube will  be inserted into one of your veins.  Medicine to help you relax (sedative) will be given through the IV tube.  The medical or dental procedure will be performed. What happens after the procedure?  Your blood pressure, heart rate, breathing rate, and blood oxygen level will be monitored often until the medicines you were given have worn off.  Do not drive for 24 hours. This information is not intended to replace advice given to you by your health care provider. Make sure you discuss any questions you have with your health care provider. Document Revised: 11/13/2017 Document Reviewed: 03/22/2016 Elsevier Patient Education  [place] Guide  Moderate Conscious Sedation, Adult, Care After These instructions provide you with information about  caring for yourself after your procedure. Your health care provider may also give you more specific instructions. Your treatment has been planned according to current medical practices, but problems sometimes occur. Call your health care provider if you have any problems or questions after your procedure. What can I expect after the procedure? After your procedure, it is common:  To feel sleepy for several hours.  To feel clumsy and have poor balance for several hours.  To have poor judgment for several hours.  To vomit if you eat too soon. Follow these instructions at home: For at least 24 hours after the procedure:   Do not: ? Participate in activities where you could fall or become injured. ? Drive. ? Use heavy machinery. ? Drink alcohol. ? Take sleeping pills or medicines that cause drowsiness. ? Make important decisions or sign legal documents. ? Take care of children on your own.  Rest. Eating and drinking  Follow the diet recommended by your health care provider.  If you vomit: ? Drink water, juice, or soup when you can drink without vomiting. ? Make sure you have little or no nausea before eating solid foods. General instructions  Have a responsible adult stay with you until you are awake and alert.  Take over-the-counter and prescription medicines only as told by your health care provider.  If you smoke, do not smoke without supervision.  Keep all follow-up visits as told by your health care provider. This is important. Contact a health care provider if:  You keep feeling nauseous or you keep vomiting.  You feel light-headed.  You develop a rash.  You have a fever. Get help right away if:  You have trouble breathing. This information is not intended to replace advice given to you by your health care provider. Make sure you discuss any questions you have with your health care provider. Document Revised: 11/13/2017 Document Reviewed: 03/22/2016 Elsevier  Patient Education  [place]. An implanted port is a device that is placed under the skin. It is usually placed in the chest. The device can be used to give IV medicine, to take blood, or for dialysis. You may have an implanted port if:  You need IV medicine that would be irritating to the small veins in your hands or arms.  You need IV medicines, such as antibiotics, for a long period of time.  You need IV nutrition for a long period of time.  You need dialysis. Having a port means that your health care provider will not need to use the veins in your arms for these procedures. You may have fewer limitations when using a port than you would if you used other types of long-term IVs, and you will likely be able to  return to normal activities after your incision heals. An implanted port has two main parts:  Reservoir. The reservoir is the part where a needle is inserted to give medicines or draw blood. The reservoir is round. After it is placed, it appears as a small, raised area under your skin.  Catheter. The catheter is a thin, flexible tube that connects the reservoir to a vein. Medicine that is inserted into the reservoir goes into the catheter and then into the vein. How is my port accessed? To access your port:  A numbing cream may be placed on the skin over the port site.  Your health care provider will put on a mask and sterile gloves.  The skin over your port will be cleaned carefully with a germ-killing soap and allowed to dry.  Your health care provider will gently pinch the port and insert a needle into it.  Your health care provider will check for a blood return to make sure the port is in the vein and is not clogged.  If your port needs to remain accessed to get medicine continuously (constant infusion), your health care provider will place a clear bandage (dressing) over the needle site. The dressing and needle will need to be changed every week, or as told by your  health care provider. What is flushing? Flushing helps keep the port from getting clogged. Follow instructions from your health care provider about how and when to flush the port. Ports are usually flushed with saline solution or a medicine called heparin. The need for flushing will depend on how the port is used:  If the port is only used from time to time to give medicines or draw blood, the port may need to be flushed: ? Before and after medicines have been given. ? Before and after blood has been drawn. ? As part of routine maintenance. Flushing may be recommended every 4-6 weeks.  If a constant infusion is running, the port may not need to be flushed.  Throw away any syringes in a disposal container that is meant for sharp items (sharps container). You can buy a sharps container from a pharmacy, or you can make one by using an empty hard plastic bottle with a cover. How long will my port stay implanted? The port can stay in for as long as your health care provider thinks it is needed. When it is time for the port to come out, a surgery will be done to remove it. The surgery will be similar to the procedure that was done to put the port in. Follow these instructions at home:   Flush your port as told by your health care provider.  If you need an infusion over several days, follow instructions from your health care provider about how to take care of your port site. Make sure you: ? Wash your hands with soap and water before you change your dressing. If soap and water are not available, use alcohol-based hand sanitizer. ? Change your dressing as told by your health care provider. ? Place any used dressings or infusion bags into a plastic bag. Throw that bag in the trash. ? Keep the dressing that covers the needle clean and dry. Do not get it wet. ? Do not use scissors or sharp objects near the tube. ? Keep the tube clamped, unless it is being used.  Check your port site every day for  signs of infection. Check for: ? Redness, swelling, or pain. ? Fluid or blood. ?  Pus or a bad smell.  Protect the skin around the port site. ? Avoid wearing bra straps that rub or irritate the site. ? Protect the skin around your port from seat belts. Place a soft pad over your chest if needed.  Bathe or shower as told by your health care provider. The site may get wet as long as you are not actively receiving an infusion.  Return to your normal activities as told by your health care provider. Ask your health care provider what activities are safe for you.  Carry a medical alert card or wear a medical alert bracelet at all times. This will let health care providers know that you have an implanted port in case of an emergency. Get help right away if:  You have redness, swelling, or pain at the port site.  You have fluid or blood coming from your port site.  You have pus or a bad smell coming from the port site.  You have a fever. Summary  Implanted ports are usually placed in the chest for long-term IV access.  Follow instructions from your health care provider about flushing the port and changing bandages (dressings).  Take care of the area around your port by avoiding clothing that puts pressure on the area, and by watching for signs of infection.  Protect the skin around your port from seat belts. Place a soft pad over your chest if needed.  Get help right away if you have a fever or you have redness, swelling, pain, drainage, or a bad smell at the port site. This information is not intended to replace advice given to you by your health care provider. Make sure you discuss any questions you have with your health care provider. Document Revised: 03/25/2019 Document Reviewed: 01/03/2017 Elsevier Patient Education  2020 [employer].

## 2020-04-18 ENCOUNTER — Ambulatory Visit
Admission: RE | Admit: 2020-04-18 | Discharge: 2020-04-18 | Disposition: A | Discharge status: Home / Self Care | Payer: Medicare Other | Source: Ambulatory Visit | Attending: Radiation Oncology | Admitting: Radiation Oncology

## 2020-04-18 ENCOUNTER — Other Ambulatory Visit: Payer: Self-pay

## 2020-04-18 DIAGNOSIS — Z510 Encounter for antineoplastic radiation therapy: Secondary | ICD-10-CM | POA: Diagnosis not present

## 2020-04-19 ENCOUNTER — Inpatient Hospital Stay: Payer: Medicare Other | Attending: Internal Medicine

## 2020-04-19 ENCOUNTER — Other Ambulatory Visit: Payer: Self-pay

## 2020-04-19 ENCOUNTER — Other Ambulatory Visit: Payer: Self-pay | Admitting: Medical Oncology

## 2020-04-19 ENCOUNTER — Ambulatory Visit
Admission: RE | Admit: 2020-04-19 | Discharge: 2020-04-19 | Disposition: A | Discharge status: Home / Self Care | Payer: Medicare Other | Source: Ambulatory Visit | Attending: Radiation Oncology | Admitting: Radiation Oncology

## 2020-04-19 ENCOUNTER — Encounter: Payer: Self-pay | Admitting: Internal Medicine

## 2020-04-19 DIAGNOSIS — Z5111 Encounter for antineoplastic chemotherapy: Secondary | ICD-10-CM

## 2020-04-19 DIAGNOSIS — Z79899 Other long term (current) drug therapy: Secondary | ICD-10-CM | POA: Insufficient documentation

## 2020-04-19 DIAGNOSIS — Z452 Encounter for adjustment and management of vascular access device: Secondary | ICD-10-CM | POA: Insufficient documentation

## 2020-04-19 DIAGNOSIS — C3411 Malignant neoplasm of upper lobe, right bronchus or lung: Secondary | ICD-10-CM | POA: Insufficient documentation

## 2020-04-19 DIAGNOSIS — Z510 Encounter for antineoplastic radiation therapy: Secondary | ICD-10-CM | POA: Diagnosis not present

## 2020-04-19 DIAGNOSIS — Z95828 Presence of other vascular implants and grafts: Secondary | ICD-10-CM

## 2020-04-19 DIAGNOSIS — Z5112 Encounter for antineoplastic immunotherapy: Secondary | ICD-10-CM | POA: Insufficient documentation

## 2020-04-19 DIAGNOSIS — C787 Secondary malignant neoplasm of liver and intrahepatic bile duct: Secondary | ICD-10-CM | POA: Insufficient documentation

## 2020-04-19 MED ORDER — PROCHLORPERAZINE MALEATE 10 MG PO TABS: 10.0000 mg | ORAL_TABLET | Freq: Four times a day (QID) | ORAL | 0 refills | Status: DC | PRN

## 2020-04-19 MED ORDER — LIDOCAINE-PRILOCAINE 2.5-2.5 % EX CREA: 1.0000 "application " | TOPICAL_CREAM | CUTANEOUS | 0 refills | Status: DC | PRN

## 2020-04-19 NOTE — Progress Notes (Signed)
Met with patient and family by the elevators to introduce myself as Arboriculturist and to offer available resources.  Discussed one-time $1000 Radio broadcast assistant to assist with personal expenses while going through treatment. Based on verbal income guidelines, they are over the income.  Patient has 2 insurances therefore copay assistance will not be needed. Gave them my card for any other financial questions or concerns.

## 2020-04-20 ENCOUNTER — Other Ambulatory Visit: Payer: Self-pay

## 2020-04-20 ENCOUNTER — Ambulatory Visit
Admission: RE | Admit: 2020-04-20 | Discharge: 2020-04-20 | Disposition: A | Discharge status: Home / Self Care | Payer: Medicare Other | Source: Ambulatory Visit | Attending: Radiation Oncology | Admitting: Radiation Oncology

## 2020-04-20 ENCOUNTER — Telehealth: Payer: Self-pay | Admitting: General Practice

## 2020-04-20 DIAGNOSIS — Z510 Encounter for antineoplastic radiation therapy: Secondary | ICD-10-CM | POA: Diagnosis not present

## 2020-04-20 NOTE — Telephone Encounter (Signed)
[place] CSW Progress Notes  Call from daughter - pt has Adv Dir from [place].  Wonders how these will be accepted in [place].  Advised to have him bring these to appt on Monday, CSW Dalene Seltzer can give him forms from [facility] : [facility] for [employer] and he can also complete these.  Family also would like supportive resources as they adjust to their caregving role - CSW [place] asked to talk w patient and family on Monday to discuss optoins.  Edwyna Shell, LCSW Clinical Social Worker Phone:  [phone] Cell:  [phone]

## 2020-04-23 ENCOUNTER — Inpatient Hospital Stay: Payer: Medicare Other

## 2020-04-23 ENCOUNTER — Encounter: Payer: Self-pay | Admitting: *Deleted

## 2020-04-23 ENCOUNTER — Ambulatory Visit
Admission: RE | Admit: 2020-04-23 | Discharge: 2020-04-23 | Disposition: A | Discharge status: Home / Self Care | Payer: Medicare Other | Source: Ambulatory Visit | Attending: Radiation Oncology | Admitting: Radiation Oncology

## 2020-04-23 ENCOUNTER — Other Ambulatory Visit: Payer: Self-pay

## 2020-04-23 VITALS — BP 130/82 | HR 95 | Temp 98.2°F | Resp 20 | Wt 264.5 lb

## 2020-04-23 DIAGNOSIS — C787 Secondary malignant neoplasm of liver and intrahepatic bile duct: Secondary | ICD-10-CM | POA: Diagnosis not present

## 2020-04-23 DIAGNOSIS — Z5112 Encounter for antineoplastic immunotherapy: Secondary | ICD-10-CM | POA: Diagnosis not present

## 2020-04-23 DIAGNOSIS — C3411 Malignant neoplasm of upper lobe, right bronchus or lung: Secondary | ICD-10-CM

## 2020-04-23 DIAGNOSIS — Z452 Encounter for adjustment and management of vascular access device: Secondary | ICD-10-CM | POA: Diagnosis not present

## 2020-04-23 DIAGNOSIS — Z510 Encounter for antineoplastic radiation therapy: Secondary | ICD-10-CM | POA: Diagnosis not present

## 2020-04-23 DIAGNOSIS — R5382 Chronic fatigue, unspecified: Secondary | ICD-10-CM

## 2020-04-23 DIAGNOSIS — Z95828 Presence of other vascular implants and grafts: Secondary | ICD-10-CM

## 2020-04-23 DIAGNOSIS — Z79899 Other long term (current) drug therapy: Secondary | ICD-10-CM | POA: Diagnosis not present

## 2020-04-23 DIAGNOSIS — Z5111 Encounter for antineoplastic chemotherapy: Secondary | ICD-10-CM | POA: Diagnosis present

## 2020-04-23 LAB — CMP (CANCER CENTER ONLY)
ALT: 22 U/L (ref 0–44)
AST: 21 U/L (ref 15–41)
Albumin: 3.2 g/dL — ABNORMAL LOW (ref 3.5–5.0)
Alkaline Phosphatase: 80 U/L (ref 38–126)
Anion gap: 11 (ref 5–15)
BUN: 16 mg/dL (ref 8–23)
CO2: 27 mmol/L (ref 22–32)
Calcium: 9.1 mg/dL (ref 8.9–10.3)
Chloride: 103 mmol/L (ref 98–111)
Creatinine: 1.21 mg/dL (ref 0.61–1.24)
GFR, Est AFR Am: >60 mL/min (ref >60)
GFR, Estimated: >60 mL/min (ref >60)
Glucose, Bld: 152 mg/dL — ABNORMAL HIGH (ref 70–99)
Potassium: 4.4 mmol/L (ref 3.5–5.1)
Sodium: 141 mmol/L (ref 135–145)
Total Bilirubin: 0.3 mg/dL (ref 0.3–1.2)
Total Protein: 6.2 g/dL — ABNORMAL LOW (ref 6.5–8.1)

## 2020-04-23 LAB — CBC WITH DIFFERENTIAL (CANCER CENTER ONLY)
Abs Immature Granulocytes: 0.04 10*3/uL (ref 0.00–0.07)
Basophils Absolute: 0 10*3/uL (ref 0.0–0.1)
Basophils Relative: 1 %
Eosinophils Absolute: 0.1 10*3/uL (ref 0.0–0.5)
Eosinophils Relative: 3 %
HCT: 44.7 % (ref 39.0–52.0)
Hemoglobin: 14.7 g/dL (ref 13.0–17.0)
Immature Granulocytes: 1 %
Lymphocytes Relative: 12 %
Lymphs Abs: 0.7 10*3/uL (ref 0.7–4.0)
MCH: 29.6 pg (ref 26.0–34.0)
MCHC: 32.9 g/dL (ref 30.0–36.0)
MCV: 90.1 fL (ref 80.0–100.0)
Monocytes Absolute: 0.6 10*3/uL (ref 0.1–1.0)
Monocytes Relative: 11 %
Neutro Abs: 4.1 10*3/uL (ref 1.7–7.7)
Neutrophils Relative %: 72 %
Platelet Count: 167 10*3/uL (ref 150–400)
RBC: 4.96 MIL/uL (ref 4.22–5.81)
RDW: 13.8 % (ref 11.5–15.5)
WBC Count: 5.6 10*3/uL (ref 4.0–10.5)
nRBC: 0 % (ref 0.0–0.2)

## 2020-04-23 LAB — TSH: TSH: 0.375 u[IU]/mL (ref 0.320–4.118)

## 2020-04-23 MED ORDER — SODIUM CHLORIDE 0.9 % IV SOLN
10.0000 mg | Freq: Once | INTRAVENOUS | Status: AC
  Administered 2020-04-23: 10 mg via INTRAVENOUS
  Filled 2020-04-23: qty 10

## 2020-04-23 MED ORDER — SODIUM CHLORIDE 0.9% FLUSH
10.0000 mL | Freq: Once | INTRAVENOUS | Status: AC
  Administered 2020-04-23: 10 mL via INTRAVENOUS
  Filled 2020-04-23: qty 10

## 2020-04-23 MED ORDER — SODIUM CHLORIDE 0.9% FLUSH
10.0000 mL | INTRAVENOUS | Status: DC | PRN
  Administered 2020-04-23: 10 mL
  Filled 2020-04-23: qty 10

## 2020-04-23 MED ORDER — HEPARIN SOD (PORK) LOCK FLUSH 100 UNIT/ML IV SOLN
500.0000 [IU] | Freq: Once | INTRAVENOUS | Status: AC | PRN
  Administered 2020-04-23: 500 [IU]
  Filled 2020-04-23: qty 5

## 2020-04-23 MED ORDER — TRILACICLIB DIHYDROCHLORIDE INJECTION 300 MG: 580.0000 mg | Freq: Once | INTRAVENOUS | Status: DC

## 2020-04-23 MED ORDER — PALONOSETRON HCL INJECTION 0.25 MG/5ML
INTRAVENOUS | Status: AC
  Filled 2020-04-23: qty 5

## 2020-04-23 MED ORDER — SODIUM CHLORIDE 0.9 % IV SOLN
150.0000 mg | Freq: Once | INTRAVENOUS | Status: AC
  Administered 2020-04-23: 150 mg via INTRAVENOUS
  Filled 2020-04-23: qty 150

## 2020-04-23 MED ORDER — SODIUM CHLORIDE 0.9 % IV SOLN
1500.0000 mg | Freq: Once | INTRAVENOUS | Status: AC
  Administered 2020-04-23: 1500 mg via INTRAVENOUS
  Filled 2020-04-23: qty 30

## 2020-04-23 MED ORDER — SODIUM CHLORIDE 0.9 % IV SOLN
580.0000 mg | Freq: Once | Status: AC
  Administered 2020-04-23: 580 mg via INTRAVENOUS
  Filled 2020-04-23: qty 250

## 2020-04-23 MED ORDER — SODIUM CHLORIDE 0.9 % IV SOLN
600.0000 mg | Freq: Once | INTRAVENOUS | Status: AC
  Administered 2020-04-23: 600 mg via INTRAVENOUS
  Filled 2020-04-23: qty 60

## 2020-04-23 MED ORDER — PALONOSETRON HCL INJECTION 0.25 MG/5ML
0.2500 mg | Freq: Once | INTRAVENOUS | Status: AC
  Administered 2020-04-23: 0.25 mg via INTRAVENOUS

## 2020-04-23 MED ORDER — TRILACICLIB DIHYDROCHLORIDE INJECTION 300 MG: Freq: Once | INTRAVENOUS | Status: DC

## 2020-04-23 MED ORDER — SODIUM CHLORIDE 0.9 % IV SOLN
100.0000 mg/m2 | Freq: Once | INTRAVENOUS | Status: AC
  Administered 2020-04-23: 240 mg via INTRAVENOUS
  Filled 2020-04-23: qty 12

## 2020-04-23 MED ORDER — SODIUM CHLORIDE 0.9 % IV SOLN
Freq: Once | INTRAVENOUS | Status: AC
  Filled 2020-04-23: qty 250

## 2020-04-23 NOTE — Patient Instructions (Signed)
[facility] Discharge Instructions for Patients Receiving Chemotherapy  Today you received the following chemotherapy agents: Durvalumab, Carboplatin, Etoposide  To help prevent nausea and vomiting after your treatment, we encourage you to take your nausea medication as directed.    If you develop nausea and vomiting that is not controlled by your nausea medication, call the clinic.   BELOW ARE SYMPTOMS THAT SHOULD BE REPORTED IMMEDIATELY:  *FEVER GREATER THAN 100.5 F  *CHILLS WITH OR WITHOUT FEVER  NAUSEA AND VOMITING THAT IS NOT CONTROLLED WITH YOUR NAUSEA MEDICATION  *UNUSUAL SHORTNESS OF BREATH  *UNUSUAL BRUISING OR BLEEDING  TENDERNESS IN MOUTH AND THROAT WITH OR WITHOUT PRESENCE OF ULCERS  *URINARY PROBLEMS  *BOWEL PROBLEMS  UNUSUAL RASH Items with * indicate a potential emergency and should be followed up as soon as possible.  Feel free to call the clinic should you have any questions or concerns. The clinic phone number is [phone].  Please show the [place] at check-in to the Emergency Department and triage nurse.

## 2020-04-23 NOTE — Progress Notes (Signed)
[place] Work  Clinical Social Work met with patient in the infusion room at [facility] to follow up on advance directives.  Patient stated he had the packet and had discussed the information with his family , but did not have it completed.  Patient stated he did not have any additional questions about the paperwork.  CSW and patient discussed the option of completing the HPOA separately from the living will.  CSW also informed patient of notary options in the community.  Patient stated he will contact CSW once paperwork in completed or with questions.  CSW also return phone call to patients daughter.  Patients daughter requested information on counseling options and caregiver support.  CSW emailed patients daughter resources for patient and caregiver support.  CSW encouraged patients daughter to call with questions or concerns.  Johnnye Lana, MSW, LCSW, OSW-C Clinical Social Worker [facility] [phone]

## 2020-04-24 ENCOUNTER — Inpatient Hospital Stay: Payer: Medicare Other

## 2020-04-24 ENCOUNTER — Ambulatory Visit
Admission: RE | Admit: 2020-04-24 | Discharge: 2020-04-24 | Disposition: A | Discharge status: Home / Self Care | Payer: Medicare Other | Source: Ambulatory Visit | Attending: Radiation Oncology | Admitting: Radiation Oncology

## 2020-04-24 ENCOUNTER — Other Ambulatory Visit: Payer: Self-pay

## 2020-04-24 VITALS — BP 128/82 | HR 101 | Temp 98.7°F | Resp 20

## 2020-04-24 DIAGNOSIS — C3411 Malignant neoplasm of upper lobe, right bronchus or lung: Secondary | ICD-10-CM

## 2020-04-24 DIAGNOSIS — Z5112 Encounter for antineoplastic immunotherapy: Secondary | ICD-10-CM | POA: Diagnosis not present

## 2020-04-24 DIAGNOSIS — Z510 Encounter for antineoplastic radiation therapy: Secondary | ICD-10-CM | POA: Diagnosis not present

## 2020-04-24 MED ORDER — SODIUM CHLORIDE 0.9% FLUSH
10.0000 mL | INTRAVENOUS | Status: DC | PRN
  Administered 2020-04-24: 10 mL
  Filled 2020-04-24: qty 10

## 2020-04-24 MED ORDER — HEPARIN SOD (PORK) LOCK FLUSH 100 UNIT/ML IV SOLN
500.0000 [IU] | Freq: Once | INTRAVENOUS | Status: AC | PRN
  Administered 2020-04-24: 500 [IU]
  Filled 2020-04-24: qty 5

## 2020-04-24 MED ORDER — SODIUM CHLORIDE 0.9 % IV SOLN
100.0000 mg/m2 | Freq: Once | INTRAVENOUS | Status: AC
  Administered 2020-04-24: 240 mg via INTRAVENOUS
  Filled 2020-04-24: qty 12

## 2020-04-24 MED ORDER — SODIUM CHLORIDE 0.9 % IV SOLN
Freq: Once | INTRAVENOUS | Status: AC
  Filled 2020-04-24: qty 250

## 2020-04-24 MED ORDER — TRILACICLIB DIHYDROCHLORIDE INJECTION 300 MG
580.0000 mg | Freq: Once | INTRAVENOUS | Status: AC
  Administered 2020-04-24: 580 mg via INTRAVENOUS
  Filled 2020-04-24: qty 0

## 2020-04-24 MED ORDER — SODIUM CHLORIDE 0.9 % IV SOLN
10.0000 mg | Freq: Once | INTRAVENOUS | Status: AC
  Administered 2020-04-24: 10 mg via INTRAVENOUS
  Filled 2020-04-24: qty 10

## 2020-04-24 NOTE — Progress Notes (Signed)
Pt's HR [id number], ok to treat per Dr. Julien Nordmann.

## 2020-04-24 NOTE — Patient Instructions (Signed)
Etoposide, VP-16 injection What is this medicine? ETOPOSIDE, VP-16 (e toe POE side) is a chemotherapy drug. It is used to treat testicular cancer, lung cancer, and other cancers. This medicine may be used for other purposes; ask your health care provider or pharmacist if you have questions. COMMON BRAND NAME(S): Etopophos, Toposar, VePesid What should I tell my health care provider before I take this medicine? They need to know if you have any of these conditions:  infection  kidney disease  liver disease  low blood counts, like low white cell, platelet, or red cell counts  an unusual or allergic reaction to etoposide, other medicines, foods, dyes, or preservatives  pregnant or trying to get pregnant  breast-feeding How should I use this medicine? This medicine is for infusion into a vein. It is administered in a hospital or clinic by a specially trained health care professional. Talk to your pediatrician regarding the use of this medicine in children. Special care may be needed. Overdosage: If you think you have taken too much of this medicine contact a poison control center or emergency room at once. NOTE: This medicine is only for you. Do not share this medicine with others. What if I miss a dose? It is important not to miss your dose. Call your doctor or health care professional if you are unable to keep an appointment. What may interact with this medicine? This medicine may interact with the following medications:  warfarin This list may not describe all possible interactions. Give your health care provider a list of all the medicines, herbs, non-prescription drugs, or dietary supplements you use. Also tell them if you smoke, drink alcohol, or use illegal drugs. Some items may interact with your medicine. What should I watch for while using this medicine? Visit your doctor for checks on your progress. This drug may make you feel generally unwell. This is not uncommon, as  chemotherapy can affect healthy cells as well as cancer cells. Report any side effects. Continue your course of treatment even though you feel ill unless your doctor tells you to stop. In some cases, you may be given additional medicines to help with side effects. Follow all directions for their use. Call your doctor or health care professional for advice if you get a fever, chills or sore throat, or other symptoms of a cold or flu. Do not treat yourself. This drug decreases your body's ability to fight infections. Try to avoid being around people who are sick. This medicine may increase your risk to bruise or bleed. Call your doctor or health care professional if you notice any unusual bleeding. Talk to your doctor about your risk of cancer. You may be more at risk for certain types of cancers if you take this medicine. Do not become pregnant while taking this medicine or for at least 6 months after stopping it. Women should inform their doctor if they wish to become pregnant or think they might be pregnant. Women of child-bearing potential will need to have a negative pregnancy test before starting this medicine. There is a potential for serious side effects to an unborn child. Talk to your health care professional or pharmacist for more information. Do not breast-feed an infant while taking this medicine. Men must use a latex condom during sexual contact with a woman while taking this medicine and for at least 4 months after stopping it. A latex condom is needed even if you have had a vasectomy. Contact your doctor right away if your partner  becomes pregnant. Do not donate sperm while taking this medicine and for at least 4 months after you stop taking this medicine. Men should inform their doctors if they wish to father a child. This medicine may lower sperm counts. What side effects may I notice from receiving this medicine? Side effects that you should report to your doctor or health care professional  as soon as possible:  allergic reactions like skin rash, itching or hives, swelling of the face, lips, or tongue  low blood counts - this medicine may decrease the number of white blood cells, red blood cells, and platelets. You may be at increased risk for infections and bleeding  nausea, vomiting  redness, blistering, peeling or loosening of the skin, including inside the mouth  signs and symptoms of infection like fever; chills; cough; sore throat; pain or trouble passing urine  signs and symptoms of low red blood cells or anemia such as unusually weak or tired; feeling faint or lightheaded; falls; breathing problems  unusual bruising or bleeding Side effects that usually do not require medical attention (report to your doctor or health care professional if they continue or are bothersome):  changes in taste  diarrhea  hair loss  loss of appetite  mouth sores This list may not describe all possible side effects. Call your doctor for medical advice about side effects. You may report side effects to FDA at 1-800-FDA-1088. Where should I keep my medicine? This drug is given in a hospital or clinic and will not be stored at home. NOTE: This sheet is a summary. It may not cover all possible information. If you have questions about this medicine, talk to your doctor, pharmacist, or health care provider.  2020 [employer] (2019-01-26 16:57:15)

## 2020-04-25 ENCOUNTER — Inpatient Hospital Stay: Payer: Medicare Other

## 2020-04-25 ENCOUNTER — Ambulatory Visit: Payer: Medicare Other

## 2020-04-25 ENCOUNTER — Other Ambulatory Visit: Payer: Self-pay

## 2020-04-25 ENCOUNTER — Ambulatory Visit
Admission: RE | Admit: 2020-04-25 | Discharge: 2020-04-25 | Disposition: A | Discharge status: Home / Self Care | Payer: Medicare Other | Source: Ambulatory Visit | Attending: Radiation Oncology | Admitting: Radiation Oncology

## 2020-04-25 ENCOUNTER — Telehealth: Payer: Self-pay | Admitting: Medical Oncology

## 2020-04-25 VITALS — BP 150/84 | HR 95 | Temp 98.5°F | Resp 20

## 2020-04-25 DIAGNOSIS — C3411 Malignant neoplasm of upper lobe, right bronchus or lung: Secondary | ICD-10-CM

## 2020-04-25 DIAGNOSIS — Z510 Encounter for antineoplastic radiation therapy: Secondary | ICD-10-CM | POA: Diagnosis not present

## 2020-04-25 DIAGNOSIS — Z5112 Encounter for antineoplastic immunotherapy: Secondary | ICD-10-CM | POA: Diagnosis not present

## 2020-04-25 MED ORDER — SODIUM CHLORIDE 0.9% FLUSH
10.0000 mL | INTRAVENOUS | Status: DC | PRN
  Administered 2020-04-25: 10 mL
  Filled 2020-04-25: qty 10

## 2020-04-25 MED ORDER — NONFORMULARY OR COMPOUNDED ITEM
Freq: Once | Status: AC
  Filled 2020-04-25: qty 250

## 2020-04-25 MED ORDER — HEPARIN SOD (PORK) LOCK FLUSH 100 UNIT/ML IV SOLN
500.0000 [IU] | Freq: Once | INTRAVENOUS | Status: AC | PRN
  Administered 2020-04-25: 13:00:00 500 [IU]
  Filled 2020-04-25: qty 5

## 2020-04-25 MED ORDER — SODIUM CHLORIDE 0.9 % IV SOLN
100.0000 mg/m2 | Freq: Once | INTRAVENOUS | Status: AC
  Administered 2020-04-25: 240 mg via INTRAVENOUS
  Filled 2020-04-25: qty 12

## 2020-04-25 MED ORDER — SODIUM CHLORIDE 0.9 % IV SOLN
Freq: Once | INTRAVENOUS | Status: AC
  Filled 2020-04-25: qty 250

## 2020-04-25 MED ORDER — SODIUM CHLORIDE 0.9 % IV SOLN
10.0000 mg | Freq: Once | INTRAVENOUS | Status: AC
  Administered 2020-04-25: 10 mg via INTRAVENOUS
  Filled 2020-04-25: qty 10

## 2020-04-25 MED FILL — Trilaciclib Dihydrochloride For IV Soln 300 MG: INTRAVENOUS | Qty: 40 | Status: AC

## 2020-04-25 MED FILL — Dextrose Inj 5%: INTRAVENOUS | Qty: 250 | Status: AC

## 2020-04-25 MED FILL — Sodium Chloride IV Soln 0.9%: INTRAVENOUS | Qty: 250 | Status: AC

## 2020-04-25 NOTE — Telephone Encounter (Signed)
Injection appt cancelled for 5/14.

## 2020-04-25 NOTE — Patient Instructions (Signed)
Etoposide, VP-16 injection What is this medicine? ETOPOSIDE, VP-16 (e toe POE side) is a chemotherapy drug. It is used to treat testicular cancer, lung cancer, and other cancers. This medicine may be used for other purposes; ask your health care provider or pharmacist if you have questions. COMMON BRAND NAME(S): Etopophos, Toposar, VePesid What should I tell my health care provider before I take this medicine? They need to know if you have any of these conditions:  infection  kidney disease  liver disease  low blood counts, like low white cell, platelet, or red cell counts  an unusual or allergic reaction to etoposide, other medicines, foods, dyes, or preservatives  pregnant or trying to get pregnant  breast-feeding How should I use this medicine? This medicine is for infusion into a vein. It is administered in a hospital or clinic by a specially trained health care professional. Talk to your pediatrician regarding the use of this medicine in children. Special care may be needed. Overdosage: If you think you have taken too much of this medicine contact a poison control center or emergency room at once. NOTE: This medicine is only for you. Do not share this medicine with others. What if I miss a dose? It is important not to miss your dose. Call your doctor or health care professional if you are unable to keep an appointment. What may interact with this medicine? This medicine may interact with the following medications:  warfarin This list may not describe all possible interactions. Give your health care provider a list of all the medicines, herbs, non-prescription drugs, or dietary supplements you use. Also tell them if you smoke, drink alcohol, or use illegal drugs. Some items may interact with your medicine. What should I watch for while using this medicine? Visit your doctor for checks on your progress. This drug may make you feel generally unwell. This is not uncommon, as  chemotherapy can affect healthy cells as well as cancer cells. Report any side effects. Continue your course of treatment even though you feel ill unless your doctor tells you to stop. In some cases, you may be given additional medicines to help with side effects. Follow all directions for their use. Call your doctor or health care professional for advice if you get a fever, chills or sore throat, or other symptoms of a cold or flu. Do not treat yourself. This drug decreases your body's ability to fight infections. Try to avoid being around people who are sick. This medicine may increase your risk to bruise or bleed. Call your doctor or health care professional if you notice any unusual bleeding. Talk to your doctor about your risk of cancer. You may be more at risk for certain types of cancers if you take this medicine. Do not become pregnant while taking this medicine or for at least 6 months after stopping it. Women should inform their doctor if they wish to become pregnant or think they might be pregnant. Women of child-bearing potential will need to have a negative pregnancy test before starting this medicine. There is a potential for serious side effects to an unborn child. Talk to your health care professional or pharmacist for more information. Do not breast-feed an infant while taking this medicine. Men must use a latex condom during sexual contact with a woman while taking this medicine and for at least 4 months after stopping it. A latex condom is needed even if you have had a vasectomy. Contact your doctor right away if your partner  becomes pregnant. Do not donate sperm while taking this medicine and for at least 4 months after you stop taking this medicine. Men should inform their doctors if they wish to father a child. This medicine may lower sperm counts. What side effects may I notice from receiving this medicine? Side effects that you should report to your doctor or health care professional  as soon as possible:  allergic reactions like skin rash, itching or hives, swelling of the face, lips, or tongue  low blood counts - this medicine may decrease the number of white blood cells, red blood cells, and platelets. You may be at increased risk for infections and bleeding  nausea, vomiting  redness, blistering, peeling or loosening of the skin, including inside the mouth  signs and symptoms of infection like fever; chills; cough; sore throat; pain or trouble passing urine  signs and symptoms of low red blood cells or anemia such as unusually weak or tired; feeling faint or lightheaded; falls; breathing problems  unusual bruising or bleeding Side effects that usually do not require medical attention (report to your doctor or health care professional if they continue or are bothersome):  changes in taste  diarrhea  hair loss  loss of appetite  mouth sores This list may not describe all possible side effects. Call your doctor for medical advice about side effects. You may report side effects to FDA at 1-800-FDA-1088. Where should I keep my medicine? This drug is given in a hospital or clinic and will not be stored at home. NOTE: This sheet is a summary. It may not cover all possible information. If you have questions about this medicine, talk to your doctor, pharmacist, or health care provider.  2020 [employer] (2019-01-26 16:57:15)

## 2020-04-26 ENCOUNTER — Ambulatory Visit
Admission: RE | Admit: 2020-04-26 | Discharge: 2020-04-26 | Disposition: A | Discharge status: Home / Self Care | Payer: Medicare Other | Source: Ambulatory Visit | Attending: Radiation Oncology | Admitting: Radiation Oncology

## 2020-04-26 ENCOUNTER — Other Ambulatory Visit: Payer: Self-pay

## 2020-04-26 DIAGNOSIS — Z510 Encounter for antineoplastic radiation therapy: Secondary | ICD-10-CM | POA: Diagnosis not present

## 2020-04-27 ENCOUNTER — Inpatient Hospital Stay: Payer: Medicare Other

## 2020-04-27 ENCOUNTER — Other Ambulatory Visit: Payer: Self-pay | Admitting: Radiation Oncology

## 2020-04-27 ENCOUNTER — Other Ambulatory Visit: Payer: Self-pay

## 2020-04-27 ENCOUNTER — Ambulatory Visit
Admission: RE | Admit: 2020-04-27 | Discharge: 2020-04-27 | Disposition: A | Discharge status: Home / Self Care | Payer: Medicare Other | Source: Ambulatory Visit | Attending: Radiation Oncology | Admitting: Radiation Oncology

## 2020-04-27 DIAGNOSIS — Z510 Encounter for antineoplastic radiation therapy: Secondary | ICD-10-CM | POA: Diagnosis not present

## 2020-04-27 MED ORDER — SUCRALFATE 1 G PO TABS
1.0000 g | ORAL_TABLET | Freq: Four times a day (QID) | ORAL | 0 refills | Status: DC
Start: 2020-04-27 — End: 2020-05-22

## 2020-04-30 ENCOUNTER — Inpatient Hospital Stay: Payer: Medicare Other

## 2020-04-30 ENCOUNTER — Encounter: Payer: Self-pay | Admitting: Internal Medicine

## 2020-04-30 ENCOUNTER — Inpatient Hospital Stay (HOSPITAL_BASED_OUTPATIENT_CLINIC_OR_DEPARTMENT_OTHER): Payer: Medicare Other | Admitting: Internal Medicine

## 2020-04-30 ENCOUNTER — Other Ambulatory Visit: Payer: Self-pay

## 2020-04-30 ENCOUNTER — Ambulatory Visit
Admission: RE | Admit: 2020-04-30 | Discharge: 2020-04-30 | Disposition: A | Discharge status: Home / Self Care | Payer: Medicare Other | Source: Ambulatory Visit | Attending: Radiation Oncology | Admitting: Radiation Oncology

## 2020-04-30 VITALS — BP 136/80 | HR 102 | Temp 98.3°F | Resp 20 | Ht 69.0 in | Wt 262.8 lb

## 2020-04-30 DIAGNOSIS — Z5111 Encounter for antineoplastic chemotherapy: Secondary | ICD-10-CM | POA: Diagnosis not present

## 2020-04-30 DIAGNOSIS — Z5112 Encounter for antineoplastic immunotherapy: Secondary | ICD-10-CM | POA: Diagnosis not present

## 2020-04-30 DIAGNOSIS — R5382 Chronic fatigue, unspecified: Secondary | ICD-10-CM | POA: Diagnosis not present

## 2020-04-30 DIAGNOSIS — C3411 Malignant neoplasm of upper lobe, right bronchus or lung: Secondary | ICD-10-CM

## 2020-04-30 DIAGNOSIS — Z510 Encounter for antineoplastic radiation therapy: Secondary | ICD-10-CM | POA: Diagnosis not present

## 2020-04-30 LAB — CBC WITH DIFFERENTIAL (CANCER CENTER ONLY)
Abs Immature Granulocytes: 0.04 10*3/uL (ref 0.00–0.07)
Basophils Absolute: 0 10*3/uL (ref 0.0–0.1)
Basophils Relative: 1 %
Eosinophils Absolute: 0.1 10*3/uL (ref 0.0–0.5)
Eosinophils Relative: 1 %
HCT: 42 % (ref 39.0–52.0)
Hemoglobin: 13.5 g/dL (ref 13.0–17.0)
Immature Granulocytes: 1 %
Lymphocytes Relative: 7 %
Lymphs Abs: 0.2 10*3/uL — ABNORMAL LOW (ref 0.7–4.0)
MCH: 29.2 pg (ref 26.0–34.0)
MCHC: 32.1 g/dL (ref 30.0–36.0)
MCV: 90.7 fL (ref 80.0–100.0)
Monocytes Absolute: 0.1 10*3/uL (ref 0.1–1.0)
Monocytes Relative: 3 %
Neutro Abs: 3 10*3/uL (ref 1.7–7.7)
Neutrophils Relative %: 87 %
Platelet Count: 129 10*3/uL — ABNORMAL LOW (ref 150–400)
RBC: 4.63 MIL/uL (ref 4.22–5.81)
RDW: 13.6 % (ref 11.5–15.5)
WBC Count: 3.5 10*3/uL — ABNORMAL LOW (ref 4.0–10.5)
nRBC: 0 % (ref 0.0–0.2)

## 2020-04-30 LAB — CMP (CANCER CENTER ONLY)
ALT: 19 U/L (ref 0–44)
AST: 16 U/L (ref 15–41)
Albumin: 3.2 g/dL — ABNORMAL LOW (ref 3.5–5.0)
Alkaline Phosphatase: 71 U/L (ref 38–126)
Anion gap: 11 (ref 5–15)
BUN: 17 mg/dL (ref 8–23)
CO2: 28 mmol/L (ref 22–32)
Calcium: 8.6 mg/dL — ABNORMAL LOW (ref 8.9–10.3)
Chloride: 100 mmol/L (ref 98–111)
Creatinine: 1.26 mg/dL — ABNORMAL HIGH (ref 0.61–1.24)
GFR, Est AFR Am: >60 mL/min (ref >60)
GFR, Estimated: 58 mL/min — ABNORMAL LOW (ref >60)
Glucose, Bld: 262 mg/dL — ABNORMAL HIGH (ref 70–99)
Potassium: 4.8 mmol/L (ref 3.5–5.1)
Sodium: 139 mmol/L (ref 135–145)
Total Bilirubin: 0.3 mg/dL (ref 0.3–1.2)
Total Protein: 6.2 g/dL — ABNORMAL LOW (ref 6.5–8.1)

## 2020-04-30 NOTE — Progress Notes (Signed)
[place] [phone]   Fax:([phone]) [phone]  OFFICE PROGRESS NOTE  [name], NP [place] [address]  DIAGNOSIS: Extensive stage (T4, N3, M1c)  small cell lung cancer diagnosed in April 2021 and presented with large right upper lobe/right hilar mass with ipsilateral and contralateral mediastinal and right supraclavicular lymphadenopathy in addition to multiple liver lesios  PRIOR THERAPY: Palliative radiotherapy to the right upper lobe lung mass under the care of Dr. Lisbeth Renshaw  CURRENT THERAPY: Systemic chemotherapy with carboplatin for AUC of 5 on day 1, etoposide 100 mg/M2 on days 1, 2 and 3 in addition to Imfinzi 1500 mg IV every 3 weeks with chemotherapy then every 4 weeks for maintenance if the patient has no evidence for progression.  He will also receive Cosela 240 mg/m2 on the days of the chemotherapy.  Status post 1 cycle.  INTERVAL HISTORY: Adam Wise 69 y.o. male returns to the clinic today for follow-up visit accompanied by his wife.  The patient is feeling fine today with no concerning complaints except for mild odynophagia.  He is currently on Carafate by Dr. Lisbeth Renshaw.  He is also using over-the-counter proton pump inhibitor.  The patient denied having any current chest pain, shortness of breath, cough or hemoptysis.  He denied having any fever or chills.  He has no nausea, vomiting, diarrhea or constipation.  He has no recent weight loss or night sweats.  He is here today for evaluation and repeat blood work.  MEDICAL HISTORY: Past Medical History:  Diagnosis Date  . Chickenpox   . Chronic knee pain   . Chronic low back pain   . Essential hypertension   . GERD (gastroesophageal reflux disease)   . OSA (obstructive sleep apnea)   . Testosterone deficiency   . Type 2 diabetes mellitus (HCC)     ALLERGIES:  is allergic to bupropion.  MEDICATIONS:  Current Outpatient Medications  Medication Sig Dispense Refill  .  acetaminophen (TYLENOL) 500 MG tablet Take 1,500 mg by mouth every 8 (eight) hours as needed for moderate pain or headache.    . albuterol (VENTOLIN HFA) 108 (90 Base) MCG/ACT inhaler Inhale 2 puffs into the lungs every 6 (six) hours as needed for wheezing or shortness of breath. 18 g 0  . amLODipine (NORVASC) 10 MG tablet TAKE 1 TABLET BY MOUTH EVERY DAY (Patient taking differently: Take 10 mg by mouth daily. ) 90 tablet 1  . aspirin EC 81 MG tablet Take 81 mg by mouth daily.     . Black Pepper-Turmeric (TURMERIC COMPLEX/BLACK PEPPER PO) Take 1 tablet by mouth in the morning and at bedtime.    . blood glucose meter kit and supplies KIT Dispense based on patient and insurance preference. Use up to four times daily as directed. 1 each 0  . Coenzyme Q10 ([employer] COQ-10) 200 MG capsule Take 200 mg by mouth daily.    Marland Kitchen esomeprazole (NEXIUM) 20 MG capsule Take 20 mg by mouth daily.     . fluticasone (FLONASE) 50 MCG/ACT nasal spray Place 2 sprays into both nostrils daily as needed for allergies.     Marland Kitchen gabapentin (NEURONTIN) 300 MG capsule Take 300 mg by mouth 2 (two) times daily.     Marland Kitchen glipiZIDE (GLUCOTROL) 10 MG tablet TAKE 1 TABLET (10 MG TOTAL) BY MOUTH 2 (TWO) TIMES DAILY BEFORE A MEAL. FOR DIABETES. 180 tablet 2  . HYDROcodone-homatropine (HYCODAN) 5-1.5 MG/5ML syrup Take 5 mLs  by mouth every 8 (eight) hours as needed for cough. (Patient taking differently: Take 5 mLs by mouth at bedtime as needed for cough. ) 120 mL 0  . KRILL OIL PO Take 350 mg by mouth daily.    . Lancets MISC USE UP TO 3 TIMES DAILY AS DIRECTED 100 each 2  . lidocaine-prilocaine (EMLA) cream Apply 1 application topically as needed. 30 g 0  . lisinopril (ZESTRIL) 5 MG tablet TAKE 1 TABLET BY MOUTH EVERY DAY (Patient taking differently: Take 5 mg by mouth daily. ) 90 tablet 1  . Melatonin 10 MG CAPS Take 10 mg by mouth at bedtime.    . metFORMIN (GLUCOPHAGE) 1000 MG tablet TAKE 1 TABLET (1,000 MG TOTAL) BY MOUTH 2 (TWO) TIMES DAILY  WITH A MEAL. 180 tablet 1  . Multiple Vitamin (MULTI-VITAMINS) TABS Take 1 tablet by mouth daily.     Glory Rosebush ULTRA test strip USE UP TO 4 TIMES DAILY AS DIRECTED 100 strip 5  . oxymetazoline (AFRIN) 0.05 % nasal spray Place 1 spray into both nostrils 2 (two) times daily as needed for congestion.    . pioglitazone (ACTOS) 45 MG tablet TAKE 1 TABLET BY MOUTH EVERY DAY (Patient taking differently: Take 45 mg by mouth daily. ) 90 tablet 1  . pravastatin (PRAVACHOL) 40 MG tablet TAKE 1 TABLET BY MOUTH EVERY DAY IN THE EVENING FOR CHOLESTEROL (Patient taking differently: Take 40 mg by mouth daily. ) 90 tablet 1  . prochlorperazine (COMPAZINE) 10 MG tablet Take 1 tablet (10 mg total) by mouth every 6 (six) hours as needed for nausea or vomiting. 30 tablet 0  . sildenafil (VIAGRA) 50 MG tablet TAKE 1 TABLET BY MOUTH EVERY DAY AS NEEDED (Patient taking differently: Take 50 mg by mouth as needed for erectile dysfunction. ) 12 tablet 1  . sucralfate (CARAFATE) 1 g tablet Take 1 tablet (1 g total) by mouth 4 (four) times daily. Dissolve each tablet in 15 cc water before use. 120 tablet 0  . SYRINGE-NEEDLE, DISP, 3 ML (B-D 3CC LUER-LOK SYR 22GX1") 22G X 1" 3 ML MISC USE AS INSTRUCTED FOR TESTOSTERONE INJECTION EVERY 2 WEEKS 10 each 2  . testosterone cypionate (DEPOTESTOSTERONE CYPIONATE) 200 MG/ML injection Inject 200 mg into the muscle every 14 (fourteen) days.     No current facility-administered medications for this visit.    SURGICAL HISTORY:  Past Surgical History:  Procedure Laterality Date  . COLONOSCOPY WITH PROPOFOL N/A 12/17/2018   Procedure: COLONOSCOPY WITH PROPOFOL;  Surgeon: Virgel Manifold, MD;  Location: [facility] ENDOSCOPY;  Service: Gastroenterology;  Laterality: N/A;  . IR IMAGING GUIDED PORT INSERTION  04/17/2020  . JOINT REPLACEMENT Bilateral   . REPLACEMENT TOTAL KNEE BILATERAL  2015  . TONSILLECTOMY  1960    REVIEW OF SYSTEMS:  A comprehensive review of systems was negative except  for: Constitutional: positive for fatigue Respiratory: positive for dyspnea on exertion   PHYSICAL EXAMINATION: General appearance: alert, cooperative, fatigued and no distress Head: atraumatic Neck: no adenopathy, no JVD, supple, symmetrical, trachea midline and thyroid not enlarged, symmetric, no tenderness/mass/nodules Lymph nodes: Cervical, supraclavicular, and axillary nodes normal. Resp: clear to auscultation bilaterally Back: symmetric, no curvature. ROM normal. No CVA tenderness. Cardio: regular rate and rhythm, S1, S2 normal, no murmur, click, rub or gallop GI: soft, non-tender; bowel sounds normal; no masses,  no organomegaly Extremities: extremities normal, atraumatic, no cyanosis or edema  ECOG PERFORMANCE STATUS: 1 - Symptomatic but completely ambulatory  Blood pressure 136/80,  pulse (!) 102, temperature 98.3 F (36.8 C), temperature source Temporal, resp. rate 20, height '5\' 9"'$  (1.753 m), weight 262 lb 12.8 oz (119.2 kg), SpO2 95 %.  LABORATORY DATA: Lab Results  Component Value Date   WBC 3.5 (L) 04/30/2020   HGB 13.5 04/30/2020   HCT 42.0 04/30/2020   MCV 90.7 04/30/2020   PLT 129 (L) 04/30/2020      Chemistry      Component Value Date/Time   NA 139 04/30/2020 1045   K 4.8 04/30/2020 1045   CL 100 04/30/2020 1045   CO2 28 04/30/2020 1045   BUN 17 04/30/2020 1045   CREATININE 1.26 (H) 04/30/2020 1045      Component Value Date/Time   CALCIUM 8.6 (L) 04/30/2020 1045   ALKPHOS 71 04/30/2020 1045   AST 16 04/30/2020 1045   ALT 19 04/30/2020 1045   BILITOT 0.3 04/30/2020 1045       RADIOGRAPHIC STUDIES: MR BRAIN W WO CONTRAST  Result Date: 04/04/2020 CLINICAL DATA:  Malignant neoplasm of unspecified part of unspecified bronchus or lung. Non-small cell lung cancer, staging. Additional history provided by scanning technologist: Stage IV lung cancer, evaluate for metastases. EXAM: MRI HEAD WITHOUT AND WITH CONTRAST TECHNIQUE: Multiplanar, multiecho pulse  sequences of the brain and surrounding structures were obtained without and with intravenous contrast. CONTRAST:  75m GADAVIST GADOBUTROL 1 MMOL/ML IV SOLN COMPARISON:  Prior brain MRI examinations 10/19/2019 and earlier. FINDINGS: Brain: Postcontrast imaging is mildly motion degraded. Subcentimeter symmetric nodular enhancing foci at the level of the foramen magnum have remained stable dating back to prior MRI 10/15/2017 and are favored benign. No abnormal enhancement is identified elsewhere within the brain to suggest intracranial metastatic disease. A known 4 mm left intrasellar cyst is suboptimally assessed on this non pituitary protocol brain MRI, but appears unchanged as compared to 10/19/2019 (series 17, image 18). Stable mild patchy T2/FLAIR hyperintensity within the cerebral white matter which is nonspecific, but consistent with chronic small vessel ischemic disease. Stable small arachnoid cyst along the right insula/sylvian fissure. There is no evidence of acute infarct. There is no midline shift or extra-axial fluid collection. No chronic intracranial blood products. Cerebral volume is normal for age. Vascular: Flow voids maintained within the proximal large arterial vessels. Skull and upper cervical spine: No focal marrow lesion. Sinuses/Orbits: Visualized orbits demonstrate no acute abnormality. Mild ethmoid sinus mucosal thickening. Tiny right maxillary sinus mucous retention cyst. No significant mastoid effusion. IMPRESSION: 1. Mildly motion degraded post-contrast imaging. 2. Symmetric subcentimeter nodular enhancing foci at the level of the foramen magnum have remained stable dating back to MRI 10/15/2017 and are favored benign. No abnormal intracranial enhancement is identified elsewhere to suggest metastatic disease. 3. Unchanged 4 mm left intrasellar cyst. Differential considerations include arachnoid cyst or cystic pituitary adenoma. 4. Stable, mild chronic small vessel ischemic changes within  the cerebral white matter. Electronically Signed   By: KKellie SimmeringDO   On: 04/04/2020 11:24   MR LIVER W WO CONTRAST  Result Date: 04/11/2020 CLINICAL DATA:  Primary Cancer Type: Lung Imaging Indication: Initial staging, evaluate low-attenuation liver lesions on recent PET-CT Initial Cancer Diagnosis Date: 03/28/2020 Established by: Biopsy-proven Detailed Pathology: Right supraclavicular lymph node biopsy 04/09/2020: Metastatic small cell carcinoma. Primary Tumor location:  Laterality: Right Cancer Specific: Non-small cell stage IV lung cancer. Large right upper lobe/right hilar mass. EXAM: MRI ABDOMEN WITHOUT AND WITH CONTRAST TECHNIQUE: Multiplanar multisequence MR imaging of the abdomen was performed both before and after the administration  of intravenous contrast. CONTRAST:  27m GADAVIST GADOBUTROL 1 MMOL/ML IV SOLN COMPARISON:  03/28/2020 PET-CT. FINDINGS: Lower chest: Partially visualized known right perihilar lung mass. Small dependent right pleural effusion. Hepatobiliary: Normal liver size. No significant hepatic steatosis. There are numerous (greater than 15) similar T2 hyperintense liver masses scattered throughout the liver with targetoid enhancement with representative 3.0 x 2.9 cm segment 5 right liver mass (series 19/image 46), 2.8 x 2.1 cm caudate lobe mass (series 19/image 26) and 2.4 x 1.9 cm segment 8 right liver mass (series 19/image 19). Normal gallbladder with no cholelithiasis. No biliary ductal dilatation. Common bile duct diameter 3 mm. No choledocholithiasis. No biliary masses, strictures or beading. Small to moderate periampullary duodenal diverticulum. Pancreas: No pancreatic mass or duct dilation. There is pancreas divisum. Spleen: Normal size. No mass. Adrenals/Urinary Tract: Right adrenal 1.7 cm adenoma, demonstrating significant loss of signal intensity on out of phase chemical shift imaging. No left adrenal nodules. No hydronephrosis. Numerous simple right greater than left  renal cysts, largest 6.5 cm in the interpolar right kidney. No suspicious renal masses. Stomach/Bowel: Normal non-distended stomach. Visualized small and large bowel is normal caliber, with no bowel wall thickening. Vascular/Lymphatic: Atherosclerotic nonaneurysmal abdominal aorta. Patent portal, splenic, hepatic and renal veins. No pathologically enlarged lymph nodes in the abdomen. Other: No abdominal ascites or focal fluid collection. Musculoskeletal: Numerous small enhancing T2 hyperintense osseous lesions throughout the visualized axial skeleton, for example measuring 2.1 cm in the medial right iliac bone (series 5/image 42) and 1.3 cm in the left L3 vertebral body (series 5/image 33). IMPRESSION: 1. Numerous (greater than 15) enhancing liver metastases scattered throughout the liver. 2. Numerous small enhancing bone lesions throughout the visualized axial skeleton, suspicious for widespread bone metastases. These lesions were PET-CT occult and could be further evaluated with dedicated spine MRI without and with IV contrast as clinically warranted. 3. Right adrenal 1.7 cm adenoma. 4. Small dependent right pleural effusion. 5.  Aortic Atherosclerosis (ICD10-I70.0). Electronically Signed   By: JIlona SorrelM.D.   On: 04/11/2020 10:04   [place] BIOPSY (LYMPH NODES)  Result Date: 04/09/2020 INDICATION: Concern for metastatic lung cancer. Please perform ultrasound-guided right supraclavicular lymph node biopsy for tissue diagnostic purposes. EXAM: ULTRASOUND-GUIDED RIGHT SUPRACLAVICULAR BIOPSY COMPARISON:  PET-CT-03/28/2020; chest CT-03/26/2020 MEDICATIONS: None ANESTHESIA/SEDATION: Moderate (conscious) sedation was employed during this procedure. A total of Versed 1 mg and Fentanyl 50 mcg was administered intravenously. Moderate Sedation Time: 10 minutes. The patient's level of consciousness and vital signs were monitored continuously by radiology nursing throughout the procedure under my direct supervision.  COMPLICATIONS: None immediate. TECHNIQUE: Informed written consent was obtained from the patient after a discussion of the risks, benefits and alternatives to treatment. Questions regarding the procedure were encouraged and answered. Initial ultrasound scanning demonstrated several clustered right supraclavicular lymph nodes compatible with the hypermetabolic supraclavicular lymph nodes seen on preceding PET-CT. Dominant approximately 1.4 x 1.4 cm right supraclavicular lymph node, likely correlating with the lymph node seen on preceding PET-CT image 50, series 4, was targeted for biopsy given location and sonographic window. An ultrasound image was saved for documentation purposes. The procedure was planned. A timeout was performed prior to the initiation of the procedure. The operative was prepped and draped in the usual sterile fashion, and a sterile drape was applied covering the operative field. A timeout was performed prior to the initiation of the procedure. Local anesthesia was provided with 1% lidocaine with epinephrine. Under direct ultrasound guidance, an 18 gauge core needle  device was utilized to obtain to obtain 6 core needle biopsies of the dominant right supraclavicular lymph node. The samples were placed in saline and submitted to pathology. The needle was removed and hemostasis was achieved with manual compression. Post procedure scan was negative for significant hematoma. A dressing was placed. The patient tolerated the procedure well without immediate postprocedural complication. IMPRESSION: Technically successful ultrasound guided biopsy of dominant right supraclavicular lymph node. Electronically Signed   By: Sandi Mariscal M.D.   On: 04/09/2020 14:26   IR IMAGING GUIDED PORT INSERTION  Result Date: 04/17/2020 INDICATION: Metastatic lung cancer. In need of durable intravenous access for chemotherapy administration. EXAM: IMPLANTED PORT A CATH PLACEMENT WITH ULTRASOUND AND FLUOROSCOPIC GUIDANCE  COMPARISON:  PET-CT-03/28/2020; chest CT-03/26/2020 MEDICATIONS: Ancef 2 gm IV; The antibiotic was administered within an appropriate time interval prior to skin puncture. ANESTHESIA/SEDATION: Moderate (conscious) sedation was employed during this procedure. A total of Versed 2 mg and Fentanyl 100 mcg was administered intravenously. Moderate Sedation Time: 28 minutes. The patient's level of consciousness and vital signs were monitored continuously by radiology nursing throughout the procedure under my direct supervision. CONTRAST:  None FLUOROSCOPY TIME:  36 seconds (14 mGy) COMPLICATIONS: None immediate. PROCEDURE: The procedure, risks, benefits, and alternatives were explained to the patient. Questions regarding the procedure were encouraged and answered. The patient understands and consents to the procedure. Patient requested placement of a right-sided port a catheter despite known right supraclavicular lymphadenopathy as he sleeps on his left side with his arm above his head. Sonographic evaluation performed of the right neck again demonstrated supraclavicular adenopathy of though also demonstrated an adequate percutaneous window for Port a catheter placement. Given patient's request, the decision was made to proceed with right-sided port a catheter placement. As such the right neck and chest were prepped with chlorhexidine in a sterile fashion, and a sterile drape was applied covering the operative field. Maximum barrier sterile technique with sterile gowns and gloves were used for the procedure. A timeout was performed prior to the initiation of the procedure. Local anesthesia was provided with 1% lidocaine with epinephrine. After creating a small venotomy incision, a micropuncture kit was utilized to access the internal jugular vein. Real-time ultrasound guidance was utilized for vascular access including the acquisition of a permanent ultrasound image documenting patency of the accessed vessel. The microwire  was utilized to measure appropriate catheter length. A subcutaneous port pocket was then created along the upper chest wall utilizing a combination of sharp and blunt dissection. The pocket was irrigated with sterile saline. A single lumen "standard sized" power injectable port was chosen for placement. The 8 Fr catheter was tunneled from the port pocket site to the venotomy incision. The port was placed in the pocket. The external catheter was trimmed to appropriate length. At the venotomy, an 8 Fr peel-away sheath was placed over a guidewire under fluoroscopic guidance. The catheter was then placed through the sheath and the sheath was removed. Final catheter positioning was confirmed and documented with a fluoroscopic spot radiograph. The port was accessed with a Huber needle, aspirated and flushed with heparinized saline. The venotomy site was closed with an interrupted 4-0 Vicryl suture. The port pocket incision was closed with interrupted 2-0 Vicryl suture. The skin was opposed with a running subcuticular 4-0 Vicryl suture. Dermabond and Steri-strips were applied to both incisions. Dressings were applied. The patient tolerated the procedure well without immediate post procedural complication. FINDINGS: After catheter placement, the tip lies within the superior cavoatrial junction.  The catheter aspirates and flushes normally and is ready for immediate use. IMPRESSION: Successful placement of a right internal jugular approach power injectable Port-A-Cath. The catheter is ready for immediate use. Again, right internal jugular approach port a catheter placement was performed per patient request after sonographic evaluation demonstrated an adequate percutaneous access to the right internal jugular vein despite known right supraclavicular lymphadenopathy. Electronically Signed   By: Sandi Mariscal M.D.   On: 04/17/2020 14:47    ASSESSMENT AND PLAN: This is a very pleasant 69 years old white male recently diagnosed  with extensive stage (T4, N3, M1 C) small cell lung cancer presented with large right upper lobe lung mass in addition to mediastinal and right supraclavicular lymphadenopathy and multiple metastatic liver lesions diagnosed in April 2021. The patient is currently undergoing systemic chemotherapy with carboplatin for AUC of 5 on day 1, etoposide 100 mg/M2 on days 1, 2 and 3 in addition to Cosela for myeloprotection during the days of the chemotherapy.  He will also receive immunotherapy with Imfinzi on day one of the chemotherapy cycle.  Status post 1 cycle.  He tolerated the first cycle of his treatment well with no concerning adverse effects.  He continues to have mild odynophagia from the palliative radiotherapy.  He is expected to complete the course of radiation in 2 days. His blood work is still acceptable at this point. I will see him back for follow-up visit in 2 weeks for evaluation before starting cycle #2. He was advised to call immediately if he has any concerning symptoms in the interval. The patient voices understanding of current disease status and treatment options and is in agreement with the current care plan.  All questions were answered. The patient knows to call the clinic with any problems, questions or concerns. We can certainly see the patient much sooner if necessary.  The total time spent in the appointment was 50 minutes.  Disclaimer: This note was dictated with voice recognition software. Similar sounding words can inadvertently be transcribed and may not be corrected upon review.

## 2020-05-01 ENCOUNTER — Other Ambulatory Visit: Payer: Self-pay

## 2020-05-01 ENCOUNTER — Ambulatory Visit
Admission: RE | Admit: 2020-05-01 | Discharge: 2020-05-01 | Disposition: A | Discharge status: Home / Self Care | Payer: Medicare Other | Source: Ambulatory Visit | Attending: Radiation Oncology | Admitting: Radiation Oncology

## 2020-05-01 DIAGNOSIS — Z510 Encounter for antineoplastic radiation therapy: Secondary | ICD-10-CM | POA: Diagnosis not present

## 2020-05-02 ENCOUNTER — Ambulatory Visit
Admission: RE | Admit: 2020-05-02 | Discharge: 2020-05-02 | Disposition: A | Discharge status: Home / Self Care | Payer: Medicare Other | Source: Ambulatory Visit | Attending: Radiation Oncology | Admitting: Radiation Oncology

## 2020-05-02 ENCOUNTER — Other Ambulatory Visit: Payer: Self-pay

## 2020-05-02 ENCOUNTER — Encounter: Payer: Self-pay | Admitting: *Deleted

## 2020-05-02 ENCOUNTER — Encounter: Payer: Self-pay | Admitting: Radiation Oncology

## 2020-05-02 DIAGNOSIS — Z510 Encounter for antineoplastic radiation therapy: Secondary | ICD-10-CM | POA: Diagnosis not present

## 2020-05-03 ENCOUNTER — Telehealth: Payer: Self-pay | Admitting: Internal Medicine

## 2020-05-03 NOTE — Telephone Encounter (Signed)
Scheduled per los. Called and spoke with patients wife. Confirmed appts 

## 2020-05-07 ENCOUNTER — Inpatient Hospital Stay: Payer: Medicare Other

## 2020-05-07 ENCOUNTER — Other Ambulatory Visit: Payer: Self-pay

## 2020-05-07 DIAGNOSIS — C3411 Malignant neoplasm of upper lobe, right bronchus or lung: Secondary | ICD-10-CM

## 2020-05-07 DIAGNOSIS — Z95828 Presence of other vascular implants and grafts: Secondary | ICD-10-CM

## 2020-05-07 DIAGNOSIS — Z5112 Encounter for antineoplastic immunotherapy: Secondary | ICD-10-CM | POA: Diagnosis not present

## 2020-05-07 LAB — CMP (CANCER CENTER ONLY)
ALT: 15 U/L (ref 0–44)
AST: 12 U/L — ABNORMAL LOW (ref 15–41)
Albumin: 3 g/dL — ABNORMAL LOW (ref 3.5–5.0)
Alkaline Phosphatase: 80 U/L (ref 38–126)
Anion gap: 10 (ref 5–15)
BUN: 15 mg/dL (ref 8–23)
CO2: 29 mmol/L (ref 22–32)
Calcium: 8.2 mg/dL — ABNORMAL LOW (ref 8.9–10.3)
Chloride: 101 mmol/L (ref 98–111)
Creatinine: 1.25 mg/dL — ABNORMAL HIGH (ref 0.61–1.24)
GFR, Est AFR Am: >60 mL/min (ref >60)
GFR, Estimated: 58 mL/min — ABNORMAL LOW (ref >60)
Glucose, Bld: 249 mg/dL — ABNORMAL HIGH (ref 70–99)
Potassium: 4.4 mmol/L (ref 3.5–5.1)
Sodium: 140 mmol/L (ref 135–145)
Total Bilirubin: 0.3 mg/dL (ref 0.3–1.2)
Total Protein: 5.9 g/dL — ABNORMAL LOW (ref 6.5–8.1)

## 2020-05-07 LAB — CBC WITH DIFFERENTIAL (CANCER CENTER ONLY)
Abs Immature Granulocytes: 0.01 10*3/uL (ref 0.00–0.07)
Basophils Absolute: 0 10*3/uL (ref 0.0–0.1)
Basophils Relative: 1 %
Eosinophils Absolute: 0 10*3/uL (ref 0.0–0.5)
Eosinophils Relative: 1 %
HCT: 38.4 % — ABNORMAL LOW (ref 39.0–52.0)
Hemoglobin: 12.5 g/dL — ABNORMAL LOW (ref 13.0–17.0)
Immature Granulocytes: 0 %
Lymphocytes Relative: 16 %
Lymphs Abs: 0.4 10*3/uL — ABNORMAL LOW (ref 0.7–4.0)
MCH: 29.2 pg (ref 26.0–34.0)
MCHC: 32.6 g/dL (ref 30.0–36.0)
MCV: 89.7 fL (ref 80.0–100.0)
Monocytes Absolute: 0.5 10*3/uL (ref 0.1–1.0)
Monocytes Relative: 22 %
Neutro Abs: 1.3 10*3/uL — ABNORMAL LOW (ref 1.7–7.7)
Neutrophils Relative %: 60 %
Platelet Count: 82 10*3/uL — ABNORMAL LOW (ref 150–400)
RBC: 4.28 MIL/uL (ref 4.22–5.81)
RDW: 14.2 % (ref 11.5–15.5)
WBC Count: 2.2 10*3/uL — ABNORMAL LOW (ref 4.0–10.5)
nRBC: 0 % (ref 0.0–0.2)

## 2020-05-07 MED ORDER — HEPARIN SOD (PORK) LOCK FLUSH 100 UNIT/ML IV SOLN
500.0000 [IU] | Freq: Once | INTRAVENOUS | Status: AC
  Administered 2020-05-07: 500 [IU] via INTRAVENOUS
  Filled 2020-05-07: qty 5

## 2020-05-07 MED ORDER — SODIUM CHLORIDE 0.9% FLUSH
10.0000 mL | INTRAVENOUS | Status: DC | PRN
  Administered 2020-05-07: 10 mL via INTRAVENOUS
  Filled 2020-05-07: qty 10

## 2020-05-15 ENCOUNTER — Telehealth: Payer: Self-pay | Admitting: Medical Oncology

## 2020-05-15 ENCOUNTER — Encounter: Payer: Self-pay | Admitting: Internal Medicine

## 2020-05-15 ENCOUNTER — Inpatient Hospital Stay: Payer: Medicare Other

## 2020-05-15 ENCOUNTER — Other Ambulatory Visit: Payer: Self-pay

## 2020-05-15 ENCOUNTER — Inpatient Hospital Stay: Payer: Medicare Other | Attending: Internal Medicine

## 2020-05-15 ENCOUNTER — Inpatient Hospital Stay (HOSPITAL_BASED_OUTPATIENT_CLINIC_OR_DEPARTMENT_OTHER): Payer: Medicare Other | Admitting: Internal Medicine

## 2020-05-15 ENCOUNTER — Encounter (HOSPITAL_COMMUNITY): Payer: Self-pay | Admitting: Internal Medicine

## 2020-05-15 ENCOUNTER — Ambulatory Visit (HOSPITAL_COMMUNITY)
Admission: RE | Admit: 2020-05-15 | Discharge: 2020-05-15 | Disposition: A | Discharge status: Home / Self Care | Payer: Medicare Other | Source: Ambulatory Visit | Attending: Internal Medicine | Admitting: Internal Medicine

## 2020-05-15 ENCOUNTER — Observation Stay (HOSPITAL_COMMUNITY)
Admission: EM | Admit: 2020-05-15 | Discharge: 2020-05-17 | Disposition: A | Discharge status: Home / Self Care | Payer: Medicare Other | Attending: Internal Medicine | Admitting: Internal Medicine

## 2020-05-15 VITALS — HR 97

## 2020-05-15 VITALS — BP 149/87 | HR 98 | Temp 97.9°F | Resp 20 | Ht 69.0 in | Wt 264.1 lb

## 2020-05-15 DIAGNOSIS — Z888 Allergy status to other drugs, medicaments and biological substances status: Secondary | ICD-10-CM | POA: Diagnosis not present

## 2020-05-15 DIAGNOSIS — K219 Gastro-esophageal reflux disease without esophagitis: Secondary | ICD-10-CM | POA: Diagnosis present

## 2020-05-15 DIAGNOSIS — C3411 Malignant neoplasm of upper lobe, right bronchus or lung: Secondary | ICD-10-CM

## 2020-05-15 DIAGNOSIS — Z7984 Long term (current) use of oral hypoglycemic drugs: Secondary | ICD-10-CM | POA: Diagnosis not present

## 2020-05-15 DIAGNOSIS — I10 Essential (primary) hypertension: Secondary | ICD-10-CM

## 2020-05-15 DIAGNOSIS — E785 Hyperlipidemia, unspecified: Secondary | ICD-10-CM | POA: Insufficient documentation

## 2020-05-15 DIAGNOSIS — Z79899 Other long term (current) drug therapy: Secondary | ICD-10-CM | POA: Diagnosis not present

## 2020-05-15 DIAGNOSIS — I119 Hypertensive heart disease without heart failure: Secondary | ICD-10-CM | POA: Insufficient documentation

## 2020-05-15 DIAGNOSIS — E230 Hypopituitarism: Secondary | ICD-10-CM | POA: Insufficient documentation

## 2020-05-15 DIAGNOSIS — R000 Tachycardia, unspecified: Secondary | ICD-10-CM | POA: Insufficient documentation

## 2020-05-15 DIAGNOSIS — I2699 Other pulmonary embolism without acute cor pulmonale: Secondary | ICD-10-CM | POA: Insufficient documentation

## 2020-05-15 DIAGNOSIS — Z20822 Contact with and (suspected) exposure to covid-19: Secondary | ICD-10-CM | POA: Diagnosis not present

## 2020-05-15 DIAGNOSIS — R0902 Hypoxemia: Secondary | ICD-10-CM | POA: Diagnosis not present

## 2020-05-15 DIAGNOSIS — Z5189 Encounter for other specified aftercare: Secondary | ICD-10-CM | POA: Insufficient documentation

## 2020-05-15 DIAGNOSIS — M545 Low back pain: Secondary | ICD-10-CM | POA: Insufficient documentation

## 2020-05-15 DIAGNOSIS — G4733 Obstructive sleep apnea (adult) (pediatric): Secondary | ICD-10-CM | POA: Diagnosis not present

## 2020-05-15 DIAGNOSIS — E1165 Type 2 diabetes mellitus with hyperglycemia: Secondary | ICD-10-CM | POA: Insufficient documentation

## 2020-05-15 DIAGNOSIS — Z86711 Personal history of pulmonary embolism: Secondary | ICD-10-CM | POA: Diagnosis present

## 2020-05-15 DIAGNOSIS — Z95828 Presence of other vascular implants and grafts: Secondary | ICD-10-CM

## 2020-05-15 DIAGNOSIS — E669 Obesity, unspecified: Secondary | ICD-10-CM | POA: Diagnosis not present

## 2020-05-15 DIAGNOSIS — R0602 Shortness of breath: Secondary | ICD-10-CM

## 2020-05-15 DIAGNOSIS — I711 Thoracic aortic aneurysm, ruptured: Secondary | ICD-10-CM | POA: Insufficient documentation

## 2020-05-15 DIAGNOSIS — R5382 Chronic fatigue, unspecified: Secondary | ICD-10-CM

## 2020-05-15 DIAGNOSIS — Z452 Encounter for adjustment and management of vascular access device: Secondary | ICD-10-CM | POA: Insufficient documentation

## 2020-05-15 DIAGNOSIS — Z7982 Long term (current) use of aspirin: Secondary | ICD-10-CM | POA: Insufficient documentation

## 2020-05-15 DIAGNOSIS — Z87891 Personal history of nicotine dependence: Secondary | ICD-10-CM | POA: Diagnosis not present

## 2020-05-15 DIAGNOSIS — J90 Pleural effusion, not elsewhere classified: Secondary | ICD-10-CM | POA: Insufficient documentation

## 2020-05-15 DIAGNOSIS — R059 Cough, unspecified: Secondary | ICD-10-CM

## 2020-05-15 DIAGNOSIS — C787 Secondary malignant neoplasm of liver and intrahepatic bile duct: Secondary | ICD-10-CM | POA: Insufficient documentation

## 2020-05-15 DIAGNOSIS — Z923 Personal history of irradiation: Secondary | ICD-10-CM | POA: Insufficient documentation

## 2020-05-15 DIAGNOSIS — E119 Type 2 diabetes mellitus without complications: Secondary | ICD-10-CM | POA: Diagnosis present

## 2020-05-15 DIAGNOSIS — Z5111 Encounter for antineoplastic chemotherapy: Secondary | ICD-10-CM

## 2020-05-15 DIAGNOSIS — Z5112 Encounter for antineoplastic immunotherapy: Secondary | ICD-10-CM | POA: Insufficient documentation

## 2020-05-15 DIAGNOSIS — C3490 Malignant neoplasm of unspecified part of unspecified bronchus or lung: Secondary | ICD-10-CM | POA: Diagnosis not present

## 2020-05-15 DIAGNOSIS — Z96653 Presence of artificial knee joint, bilateral: Secondary | ICD-10-CM | POA: Diagnosis not present

## 2020-05-15 DIAGNOSIS — J0111 Acute recurrent frontal sinusitis: Secondary | ICD-10-CM | POA: Insufficient documentation

## 2020-05-15 DIAGNOSIS — E118 Type 2 diabetes mellitus with unspecified complications: Secondary | ICD-10-CM

## 2020-05-15 HISTORY — PX: TRANSTHORACIC ECHOCARDIOGRAM: SHX275

## 2020-05-15 LAB — CMP (CANCER CENTER ONLY)
ALT: 18 U/L (ref 0–44)
AST: 13 U/L — ABNORMAL LOW (ref 15–41)
Albumin: 3.1 g/dL — ABNORMAL LOW (ref 3.5–5.0)
Alkaline Phosphatase: 111 U/L (ref 38–126)
Anion gap: 11 (ref 5–15)
BUN: 11 mg/dL (ref 8–23)
CO2: 24 mmol/L (ref 22–32)
Calcium: 8.5 mg/dL — ABNORMAL LOW (ref 8.9–10.3)
Chloride: 104 mmol/L (ref 98–111)
Creatinine: 1.16 mg/dL (ref 0.61–1.24)
GFR, Est AFR Am: >60 mL/min (ref >60)
GFR, Estimated: >60 mL/min (ref >60)
Glucose, Bld: 229 mg/dL — ABNORMAL HIGH (ref 70–99)
Potassium: 4.3 mmol/L (ref 3.5–5.1)
Sodium: 139 mmol/L (ref 135–145)
Total Bilirubin: 0.3 mg/dL (ref 0.3–1.2)
Total Protein: 6 g/dL — ABNORMAL LOW (ref 6.5–8.1)

## 2020-05-15 LAB — CBC WITH DIFFERENTIAL (CANCER CENTER ONLY)
Abs Immature Granulocytes: 0.15 10*3/uL — ABNORMAL HIGH (ref 0.00–0.07)
Basophils Absolute: 0 10*3/uL (ref 0.0–0.1)
Basophils Relative: 1 %
Eosinophils Absolute: 0.1 10*3/uL (ref 0.0–0.5)
Eosinophils Relative: 1 %
HCT: 39.7 % (ref 39.0–52.0)
Hemoglobin: 12.8 g/dL — ABNORMAL LOW (ref 13.0–17.0)
Immature Granulocytes: 3 %
Lymphocytes Relative: 8 %
Lymphs Abs: 0.4 10*3/uL — ABNORMAL LOW (ref 0.7–4.0)
MCH: 28.7 pg (ref 26.0–34.0)
MCHC: 32.2 g/dL (ref 30.0–36.0)
MCV: 89 fL (ref 80.0–100.0)
Monocytes Absolute: 0.6 10*3/uL (ref 0.1–1.0)
Monocytes Relative: 11 %
Neutro Abs: 4 10*3/uL (ref 1.7–7.7)
Neutrophils Relative %: 76 %
Platelet Count: 233 10*3/uL (ref 150–400)
RBC: 4.46 MIL/uL (ref 4.22–5.81)
RDW: 15.1 % (ref 11.5–15.5)
WBC Count: 5.3 10*3/uL (ref 4.0–10.5)
nRBC: 0 % (ref 0.0–0.2)

## 2020-05-15 LAB — GLUCOSE, CAPILLARY: Glucose-Capillary: 267 mg/dL — ABNORMAL HIGH (ref 70–99)

## 2020-05-15 LAB — PROTIME-INR
INR: 1 (ref 0.8–1.2)
Prothrombin Time: 12.4 seconds (ref 11.4–15.2)

## 2020-05-15 LAB — TROPONIN I (HIGH SENSITIVITY): Troponin I (High Sensitivity): 8 ng/L (ref <18)

## 2020-05-15 LAB — TSH: TSH: 0.703 u[IU]/mL (ref 0.350–4.500)

## 2020-05-15 LAB — APTT: aPTT: 24 seconds (ref 24–36)

## 2020-05-15 LAB — SARS CORONAVIRUS 2 BY RT PCR (HOSPITAL ORDER, PERFORMED IN ~~LOC~~ HOSPITAL LAB): SARS Coronavirus 2: NEGATIVE

## 2020-05-15 MED ORDER — HEPARIN SOD (PORK) LOCK FLUSH 100 UNIT/ML IV SOLN
INTRAVENOUS | Status: AC
  Administered 2020-05-15: 500 [IU] via INTRAVENOUS
  Filled 2020-05-15: qty 5

## 2020-05-15 MED ORDER — SODIUM CHLORIDE 0.9 % IV SOLN
627.5000 mg | Freq: Once | INTRAVENOUS | Status: AC
  Administered 2020-05-15: 630 mg via INTRAVENOUS
  Filled 2020-05-15: qty 63

## 2020-05-15 MED ORDER — SUCRALFATE 1 G PO TABS
1.0000 g | ORAL_TABLET | Freq: Three times a day (TID) | ORAL | Status: DC
  Filled 2020-05-15 (×4): qty 1

## 2020-05-15 MED ORDER — SODIUM CHLORIDE 0.9 % IV SOLN
150.0000 mg | Freq: Once | INTRAVENOUS | Status: AC
  Administered 2020-05-15: 150 mg via INTRAVENOUS
  Filled 2020-05-15: qty 150

## 2020-05-15 MED ORDER — SODIUM CHLORIDE 0.9% FLUSH
10.0000 mL | INTRAVENOUS | Status: DC | PRN
  Administered 2020-05-15 – 2020-05-16 (×2): 10 mL

## 2020-05-15 MED ORDER — LISINOPRIL 10 MG PO TABS
5.0000 mg | ORAL_TABLET | Freq: Every day | ORAL | Status: DC
  Administered 2020-05-16 – 2020-05-17 (×2): 5 mg via ORAL
  Filled 2020-05-15 (×2): qty 1

## 2020-05-15 MED ORDER — PALONOSETRON HCL INJECTION 0.25 MG/5ML
0.2500 mg | Freq: Once | INTRAVENOUS | Status: AC
  Administered 2020-05-15: 0.25 mg via INTRAVENOUS

## 2020-05-15 MED ORDER — SODIUM CHLORIDE (PF) 0.9 % IJ SOLN
INTRAMUSCULAR | Status: AC
  Filled 2020-05-15: qty 50

## 2020-05-15 MED ORDER — MELATONIN 5 MG PO TABS
10.0000 mg | ORAL_TABLET | Freq: Every day | ORAL | Status: DC
  Administered 2020-05-15 – 2020-05-16 (×2): 10 mg via ORAL
  Filled 2020-05-15 (×2): qty 2

## 2020-05-15 MED ORDER — ALBUTEROL SULFATE (2.5 MG/3ML) 0.083% IN NEBU: 2.5000 mg | INHALATION_SOLUTION | Freq: Four times a day (QID) | RESPIRATORY_TRACT | Status: DC | PRN

## 2020-05-15 MED ORDER — SODIUM CHLORIDE 0.9% FLUSH
10.0000 mL | INTRAVENOUS | Status: DC | PRN
  Administered 2020-05-15: 10 mL
  Filled 2020-05-15: qty 10

## 2020-05-15 MED ORDER — PRAVASTATIN SODIUM 20 MG PO TABS
40.0000 mg | ORAL_TABLET | Freq: Every day | ORAL | Status: DC
  Administered 2020-05-15 – 2020-05-17 (×3): 40 mg via ORAL
  Filled 2020-05-15 (×2): qty 2

## 2020-05-15 MED ORDER — SODIUM CHLORIDE 0.9% FLUSH
10.0000 mL | INTRAVENOUS | Status: DC | PRN
  Administered 2020-05-15: 10 mL via INTRAVENOUS
  Filled 2020-05-15: qty 10

## 2020-05-15 MED ORDER — ASPIRIN EC 81 MG PO TBEC
81.0000 mg | DELAYED_RELEASE_TABLET | Freq: Every day | ORAL | Status: DC
  Administered 2020-05-16 – 2020-05-17 (×2): 81 mg via ORAL
  Filled 2020-05-15 (×2): qty 1

## 2020-05-15 MED ORDER — ONDANSETRON HCL 4 MG PO TABS: 4.0000 mg | ORAL_TABLET | Freq: Four times a day (QID) | ORAL | Status: DC | PRN

## 2020-05-15 MED ORDER — HEPARIN SOD (PORK) LOCK FLUSH 100 UNIT/ML IV SOLN: 500.0000 [IU] | Freq: Once | INTRAVENOUS | Status: AC

## 2020-05-15 MED ORDER — SODIUM CHLORIDE 0.9 % IV SOLN
1500.0000 mg | Freq: Once | INTRAVENOUS | Status: AC
  Administered 2020-05-15: 1500 mg via INTRAVENOUS
  Filled 2020-05-15: qty 30

## 2020-05-15 MED ORDER — GABAPENTIN 300 MG PO CAPS
300.0000 mg | ORAL_CAPSULE | Freq: Two times a day (BID) | ORAL | Status: DC
  Administered 2020-05-15 – 2020-05-17 (×4): 300 mg via ORAL
  Filled 2020-05-15 (×4): qty 1

## 2020-05-15 MED ORDER — AMLODIPINE BESYLATE 5 MG PO TABS
10.0000 mg | ORAL_TABLET | Freq: Every day | ORAL | Status: DC
  Administered 2020-05-16 – 2020-05-17 (×2): 10 mg via ORAL
  Filled 2020-05-15 (×2): qty 2

## 2020-05-15 MED ORDER — ONDANSETRON HCL 4 MG/2ML IJ SOLN: 4.0000 mg | Freq: Four times a day (QID) | INTRAMUSCULAR | Status: DC | PRN

## 2020-05-15 MED ORDER — CHLORHEXIDINE GLUCONATE CLOTH 2 % EX PADS
6.0000 | MEDICATED_PAD | Freq: Every day | CUTANEOUS | Status: DC
  Administered 2020-05-16 – 2020-05-17 (×2): 6 via TOPICAL

## 2020-05-15 MED ORDER — SODIUM CHLORIDE 0.9 % IV SOLN
10.0000 mg | Freq: Once | INTRAVENOUS | Status: AC
  Administered 2020-05-15: 10 mg via INTRAVENOUS
  Filled 2020-05-15: qty 10

## 2020-05-15 MED ORDER — HEPARIN SOD (PORK) LOCK FLUSH 100 UNIT/ML IV SOLN
500.0000 [IU] | Freq: Once | INTRAVENOUS | Status: AC | PRN
  Administered 2020-05-15: 500 [IU]
  Filled 2020-05-15: qty 5

## 2020-05-15 MED ORDER — PANTOPRAZOLE SODIUM 40 MG PO TBEC
40.0000 mg | DELAYED_RELEASE_TABLET | Freq: Every day | ORAL | Status: DC
  Administered 2020-05-15 – 2020-05-17 (×3): 40 mg via ORAL
  Filled 2020-05-15 (×2): qty 1

## 2020-05-15 MED ORDER — HEPARIN BOLUS VIA INFUSION
3000.0000 [IU] | Freq: Once | INTRAVENOUS | Status: AC
  Administered 2020-05-15: 3000 [IU] via INTRAVENOUS
  Filled 2020-05-15: qty 3000

## 2020-05-15 MED ORDER — PALONOSETRON HCL INJECTION 0.25 MG/5ML
INTRAVENOUS | Status: AC
  Filled 2020-05-15: qty 5

## 2020-05-15 MED ORDER — INSULIN ASPART 100 UNIT/ML ~~LOC~~ SOLN
0.0000 [IU] | Freq: Three times a day (TID) | SUBCUTANEOUS | Status: DC
  Administered 2020-05-16 (×2): 2 [IU] via SUBCUTANEOUS
  Administered 2020-05-16: 3 [IU] via SUBCUTANEOUS
  Administered 2020-05-17: 1 [IU] via SUBCUTANEOUS
  Filled 2020-05-15: qty 0.09

## 2020-05-15 MED ORDER — SODIUM CHLORIDE 0.9 % IV SOLN
100.0000 mg/m2 | Freq: Once | INTRAVENOUS | Status: AC
  Administered 2020-05-15: 240 mg via INTRAVENOUS
  Filled 2020-05-15: qty 12

## 2020-05-15 MED ORDER — ALBUTEROL SULFATE HFA 108 (90 BASE) MCG/ACT IN AERS: 2.0000 | INHALATION_SPRAY | Freq: Four times a day (QID) | RESPIRATORY_TRACT | Status: DC | PRN

## 2020-05-15 MED ORDER — SODIUM CHLORIDE 0.9 % IV SOLN
Freq: Once | INTRAVENOUS | Status: AC
  Filled 2020-05-15: qty 250

## 2020-05-15 MED ORDER — TRILACICLIB DIHYDROCHLORIDE INJECTION 300 MG
240.0000 mg/m2 | Freq: Once | INTRAVENOUS | Status: AC
  Administered 2020-05-15: 600 mg via INTRAVENOUS
  Filled 2020-05-15: qty 40

## 2020-05-15 MED ORDER — IOHEXOL 350 MG/ML SOLN
100.0000 mL | Freq: Once | INTRAVENOUS | Status: AC | PRN
  Administered 2020-05-15: 100 mL via INTRAVENOUS

## 2020-05-15 MED ORDER — HEPARIN (PORCINE) 25000 UT/250ML-% IV SOLN
1650.0000 [IU]/h | INTRAVENOUS | Status: DC
  Administered 2020-05-15 – 2020-05-16 (×3): 1650 [IU]/h via INTRAVENOUS
  Filled 2020-05-15 (×2): qty 250

## 2020-05-15 MED FILL — Dexamethasone Sodium Phosphate Inj 100 MG/10ML: INTRAMUSCULAR | Qty: 1 | Status: AC

## 2020-05-15 NOTE — Patient Instructions (Signed)
[facility] Discharge Instructions for Patients Receiving Chemotherapy  Today you received the following chemotherapy agents: Imfinzi, Carboplatin, and Etoposide  To help prevent nausea and vomiting after your treatment, we encourage you to take your nausea medication as prescribed.    If you develop nausea and vomiting that is not controlled by your nausea medication, call the clinic.   BELOW ARE SYMPTOMS THAT SHOULD BE REPORTED IMMEDIATELY:  *FEVER GREATER THAN 100.5 F  *CHILLS WITH OR WITHOUT FEVER  NAUSEA AND VOMITING THAT IS NOT CONTROLLED WITH YOUR NAUSEA MEDICATION  *UNUSUAL SHORTNESS OF BREATH  *UNUSUAL BRUISING OR BLEEDING  TENDERNESS IN MOUTH AND THROAT WITH OR WITHOUT PRESENCE OF ULCERS  *URINARY PROBLEMS  *BOWEL PROBLEMS  UNUSUAL RASH Items with * indicate a potential emergency and should be followed up as soon as possible.  Feel free to call the clinic should you have any questions or concerns. The clinic phone number is [phone].  Please show the [place] at check-in to the Emergency Department and triage nurse.

## 2020-05-15 NOTE — ED Notes (Signed)
RN Riche requested call back in  5 minutes. Will attempt at that time.

## 2020-05-15 NOTE — Progress Notes (Signed)
Adam Wise [phone]   [phone]  OFFICE PROGRESS NOTE  Adam Koch, NP [place] [address]  DIAGNOSIS: Extensive stage (T4, N3, M1c)  small cell lung cancer diagnosed in April 2021 and presented with large right upper lobe/right hilar mass with ipsilateral and contralateral mediastinal and right supraclavicular lymphadenopathy in addition to multiple liver lesios  PRIOR THERAPY: Palliative radiotherapy to the right upper lobe lung mass under the care of Dr. Lisbeth Wise  CURRENT THERAPY: Systemic chemotherapy with carboplatin for AUC of 5 on day 1, etoposide 100 mg/M2 on days 1, 2 and 3 in addition to Imfinzi 1500 mg IV every 3 weeks with chemotherapy then every 4 weeks for maintenance if the patient has no evidence for progression.  He will also receive Cosela 240 mg/m2 on the days of the chemotherapy.  Status post 1 cycle.  INTERVAL HISTORY: Adam Wise 69 y.o. male returns to the clinic today for follow-up visit accompanied by his wife.  The patient is feeling fine today with no concerning complaints except for shortness of breath with exertion.  He denied having any chest pain, cough or hemoptysis.  He denied having any fever or chills.  He has no nausea, vomiting, diarrhea or constipation.  He denied having any significant weight loss or night sweats.  He tolerated the first cycle of his treatment well with no concerning adverse effects.  He is here today for evaluation before starting cycle #2.  MEDICAL HISTORY: Past Medical History:  Diagnosis Date  . Chickenpox   . Chronic knee pain   . Chronic low back pain   . Essential hypertension   . GERD (gastroesophageal reflux disease)   . OSA (obstructive sleep apnea)   . Testosterone deficiency   . Type 2 diabetes mellitus (HCC)     ALLERGIES:  is allergic to bupropion.  MEDICATIONS:  Current Outpatient Medications  Medication Sig Dispense Refill  . acetaminophen  (TYLENOL) 500 MG tablet Take 1,500 mg by mouth every 8 (eight) hours as needed for moderate pain or headache.    . albuterol (VENTOLIN HFA) 108 (90 Base) MCG/ACT inhaler Inhale 2 puffs into the lungs every 6 (six) hours as needed for wheezing or shortness of breath. 18 g 0  . amLODipine (NORVASC) 10 MG tablet TAKE 1 TABLET BY MOUTH EVERY DAY (Patient taking differently: Take 10 mg by mouth daily. ) 90 tablet 1  . aspirin EC 81 MG tablet Take 81 mg by mouth daily.     . Black Pepper-Turmeric (TURMERIC COMPLEX/BLACK PEPPER PO) Take 1 tablet by mouth in the morning and at bedtime.    . blood glucose meter kit and supplies KIT Dispense based on patient and insurance preference. Use up to four times daily as directed. 1 each 0  . Coenzyme Q10 ([employer] COQ-10) 200 MG capsule Take 200 mg by mouth daily.    Marland Kitchen esomeprazole (NEXIUM) 20 MG capsule Take 20 mg by mouth daily.     . fluticasone (FLONASE) 50 MCG/ACT nasal spray Place 2 sprays into both nostrils daily as needed for allergies.     Marland Kitchen gabapentin (NEURONTIN) 300 MG capsule Take 300 mg by mouth 2 (two) times daily.     Marland Kitchen glipiZIDE (GLUCOTROL) 10 MG tablet TAKE 1 TABLET (10 MG TOTAL) BY MOUTH 2 (TWO) TIMES DAILY BEFORE A MEAL. FOR DIABETES. 180 tablet 2  . HYDROcodone-homatropine (HYCODAN) 5-1.5 MG/5ML syrup Take 5 mLs by mouth every 8 (  eight) hours as needed for cough. (Patient taking differently: Take 5 mLs by mouth at bedtime as needed for cough. ) 120 mL 0  . KRILL OIL PO Take 350 mg by mouth daily.    . Lancets MISC USE UP TO 3 TIMES DAILY AS DIRECTED 100 each 2  . lidocaine-prilocaine (EMLA) cream Apply 1 application topically as needed. 30 g 0  . lisinopril (ZESTRIL) 5 MG tablet TAKE 1 TABLET BY MOUTH EVERY DAY (Patient taking differently: Take 5 mg by mouth daily. ) 90 tablet 1  . Melatonin 10 MG CAPS Take 10 mg by mouth at bedtime.    . metFORMIN (GLUCOPHAGE) 1000 MG tablet TAKE 1 TABLET (1,000 MG TOTAL) BY MOUTH 2 (TWO) TIMES DAILY WITH A MEAL. 180  tablet 1  . Multiple Vitamin (MULTI-VITAMINS) TABS Take 1 tablet by mouth daily.     Glory Rosebush ULTRA test strip USE UP TO 4 TIMES DAILY AS DIRECTED 100 strip 5  . oxymetazoline (AFRIN) 0.05 % nasal spray Place 1 spray into both nostrils 2 (two) times daily as needed for congestion.    . pioglitazone (ACTOS) 45 MG tablet TAKE 1 TABLET BY MOUTH EVERY DAY (Patient taking differently: Take 45 mg by mouth daily. ) 90 tablet 1  . pravastatin (PRAVACHOL) 40 MG tablet TAKE 1 TABLET BY MOUTH EVERY DAY IN THE EVENING FOR CHOLESTEROL (Patient taking differently: Take 40 mg by mouth daily. ) 90 tablet 1  . prochlorperazine (COMPAZINE) 10 MG tablet Take 1 tablet (10 mg total) by mouth every 6 (six) hours as needed for nausea or vomiting. 30 tablet 0  . sildenafil (VIAGRA) 50 MG tablet TAKE 1 TABLET BY MOUTH EVERY DAY AS NEEDED (Patient taking differently: Take 50 mg by mouth as needed for erectile dysfunction. ) 12 tablet 1  . sucralfate (CARAFATE) 1 g tablet Take 1 tablet (1 g total) by mouth 4 (four) times daily. Dissolve each tablet in 15 cc water before use. 120 tablet 0  . SYRINGE-NEEDLE, DISP, 3 ML (B-D 3CC LUER-LOK SYR 22GX1") 22G X 1" 3 ML MISC USE AS INSTRUCTED FOR TESTOSTERONE INJECTION EVERY 2 WEEKS 10 each 2  . testosterone cypionate (DEPOTESTOSTERONE CYPIONATE) 200 MG/ML injection Inject 200 mg into the muscle every 14 (fourteen) days.     No current facility-administered medications for this visit.    SURGICAL HISTORY:  Past Surgical History:  Procedure Laterality Date  . COLONOSCOPY WITH PROPOFOL N/A 12/17/2018   Procedure: COLONOSCOPY WITH PROPOFOL;  Surgeon: Adam Manifold, MD;  Location: [facility] ENDOSCOPY;  Service: Gastroenterology;  Laterality: N/A;  . IR IMAGING GUIDED PORT INSERTION  04/17/2020  . JOINT REPLACEMENT Bilateral   . REPLACEMENT TOTAL KNEE BILATERAL  2015  . TONSILLECTOMY  1960    REVIEW OF SYSTEMS:  A comprehensive review of systems was negative except for:  Respiratory: positive for dyspnea on exertion   PHYSICAL EXAMINATION: General appearance: alert, cooperative, fatigued and no distress Head: atraumatic Neck: no adenopathy, no JVD, supple, symmetrical, trachea midline and thyroid not enlarged, symmetric, no tenderness/mass/nodules Lymph nodes: Cervical, supraclavicular, and axillary nodes normal. Resp: clear to auscultation bilaterally Back: symmetric, no curvature. ROM normal. No CVA tenderness. Cardio: regular rate and rhythm, S1, S2 normal, no murmur, click, rub or gallop GI: soft, non-tender; bowel sounds normal; no masses,  no organomegaly Extremities: extremities normal, atraumatic, no cyanosis or edema  ECOG PERFORMANCE STATUS: 1 - Symptomatic but completely ambulatory  Blood pressure (!) 149/87, pulse 98, temperature 97.9 F (36.6 C),  temperature source Temporal, resp. rate 20, height _0  (1.753 m), weight 264 lb 1.6 oz (119.8 kg), SpO2 96 %.  LABORATORY DATA: Lab Results  Component Value Date   WBC 5.3 05/15/2020   HGB 12.8 (L) 05/15/2020   HCT 39.7 05/15/2020   MCV 89.0 05/15/2020   PLT 233 05/15/2020      Chemistry      Component Value Date/Time   NA 139 05/15/2020 1045   K 4.3 05/15/2020 1045   CL 104 05/15/2020 1045   CO2 24 05/15/2020 1045   BUN 11 05/15/2020 1045   CREATININE 1.16 05/15/2020 1045      Component Value Date/Time   CALCIUM 8.5 (L) 05/15/2020 1045   ALKPHOS 111 05/15/2020 1045   AST 13 (L) 05/15/2020 1045   ALT 18 05/15/2020 1045   BILITOT 0.3 05/15/2020 1045       RADIOGRAPHIC STUDIES: IR IMAGING GUIDED PORT INSERTION  Result Date: 04/17/2020 INDICATION: Metastatic lung cancer. In need of durable intravenous access for chemotherapy administration. EXAM: IMPLANTED PORT A CATH PLACEMENT WITH ULTRASOUND AND FLUOROSCOPIC GUIDANCE COMPARISON:  PET-CT-03/28/2020; chest CT-03/26/2020 MEDICATIONS: Ancef 2 gm IV; The antibiotic was administered within an appropriate time interval prior to skin  puncture. ANESTHESIA/SEDATION: Moderate (conscious) sedation was employed during this procedure. A total of Versed 2 mg and Fentanyl 100 mcg was administered intravenously. Moderate Sedation Time: 28 minutes. The patient's level of consciousness and vital signs were monitored continuously by radiology nursing throughout the procedure under my direct supervision. CONTRAST:  None FLUOROSCOPY TIME:  36 seconds (14 mGy) COMPLICATIONS: None immediate. PROCEDURE: The procedure, risks, benefits, and alternatives were explained to the patient. Questions regarding the procedure were encouraged and answered. The patient understands and consents to the procedure. Patient requested placement of a right-sided port a catheter despite known right supraclavicular lymphadenopathy as he sleeps on his left side with his arm above his head. Sonographic evaluation performed of the right neck again demonstrated supraclavicular adenopathy of though also demonstrated an adequate percutaneous window for Port a catheter placement. Given patient's request, the decision was made to proceed with right-sided port a catheter placement. As such the right neck and chest were prepped with chlorhexidine in a sterile fashion, and a sterile drape was applied covering the operative field. Maximum barrier sterile technique with sterile gowns and gloves were used for the procedure. A timeout was performed prior to the initiation of the procedure. Local anesthesia was provided with 1% lidocaine with epinephrine. After creating a small venotomy incision, a micropuncture kit was utilized to access the internal jugular vein. Real-time ultrasound guidance was utilized for vascular access including the acquisition of a permanent ultrasound image documenting patency of the accessed vessel. The microwire was utilized to measure appropriate catheter length. A subcutaneous port pocket was then created along the upper chest wall utilizing a combination of sharp and  blunt dissection. The pocket was irrigated with sterile saline. A single lumen "standard sized" power injectable port was chosen for placement. The 8 Fr catheter was tunneled from the port pocket site to the venotomy incision. The port was placed in the pocket. The external catheter was trimmed to appropriate length. At the venotomy, an 8 Fr peel-away sheath was placed over a guidewire under fluoroscopic guidance. The catheter was then placed through the sheath and the sheath was removed. Final catheter positioning was confirmed and documented with a fluoroscopic spot radiograph. The port was accessed with a Huber needle, aspirated and flushed with heparinized saline. The venotomy  site was closed with an interrupted 4-0 Vicryl suture. The port pocket incision was closed with interrupted 2-0 Vicryl suture. The skin was opposed with a running subcuticular 4-0 Vicryl suture. Dermabond and Steri-strips were applied to both incisions. Dressings were applied. The patient tolerated the procedure well without immediate post procedural complication. FINDINGS: After catheter placement, the tip lies within the superior cavoatrial junction. The catheter aspirates and flushes normally and is ready for immediate use. IMPRESSION: Successful placement of a right internal jugular approach power injectable Port-A-Cath. The catheter is ready for immediate use. Again, right internal jugular approach port a catheter placement was performed per patient request after sonographic evaluation demonstrated an adequate percutaneous access to the right internal jugular vein despite known right supraclavicular lymphadenopathy. Electronically Signed   By: Sandi Mariscal M.D.   On: 04/17/2020 14:47    ASSESSMENT AND PLAN: This is a very pleasant 69 years old white male recently diagnosed with extensive stage (T4, N3, M1 C) small cell lung cancer presented with large right upper lobe lung mass in addition to mediastinal and right supraclavicular  lymphadenopathy and multiple metastatic liver lesions diagnosed in April 2021. The patient is currently undergoing systemic chemotherapy with carboplatin for AUC of 5 on day 1, etoposide 100 mg/M2 on days 1, 2 and 3 in addition to Cosela for myeloprotection during the days of the chemotherapy.  He will also receive immunotherapy with Imfinzi on day one of the chemotherapy cycle.  Status post 1 cycle.   The patient tolerated the first cycle of his treatment well with no concerning adverse effect but for the last few days he has been complaining of significant shortness of breath with minimal exertion. I recommended for the patient to have CT angiogram of the chest to rule out any pulmonary embolism or any other acute cardiopulmonary abnormality. He will proceed with cycle #2 today as planned unless there is any concerning findings on the scan. He will come back for follow-up visit in 3 weeks for evaluation with repeat CT scan of the abdomen and pelvis to complete the staging work-up after cycle #2. The patient was advised to call immediately if he has any concerning symptoms in the interval. The patient voices understanding of current disease status and treatment options and is in agreement with the current care plan.  All questions were answered. The patient knows to call the clinic with any problems, questions or concerns. We can certainly see the patient much sooner if necessary.  The total time spent in the appointment was 50 minutes.  Disclaimer: This note was dictated with voice recognition software. Similar sounding words can inadvertently be transcribed and may not be corrected upon review.

## 2020-05-15 NOTE — ED Triage Notes (Signed)
Pt reports he was at the infusion center getting chemo today and became SOB. Patient reports this is his second round of chemo to treat his lung cancer. Patient became SOB and went for a CT scan and they found a blood clot in the lung. Pt denies current blood thinners

## 2020-05-15 NOTE — Progress Notes (Signed)
Patient denied wanting to stay accessed for future  treatment this week

## 2020-05-15 NOTE — Telephone Encounter (Signed)
Requested stat CT angio.Pending PA - Darlena notified.Marland Kitchen

## 2020-05-15 NOTE — H&P (Signed)
History and Physical    Adam Wise XAJ:[id number] DOB: Apr 27, 1951 DOA: 05/15/2020  PCP: [name], NP  Patient coming from: Home.  Chief Complaint: Shortness of breath.  HPI: Adam Wise is a 69 y.o. male with history of extensive stage small cell lung cancer status post radiation presently on chemotherapy has been experiencing increasing shortness of breath last 3 days.  Patient shortness of breath is on exertion denies any chest pain productive cough fever or chills.  Had gone to his oncologist office today and on complaining about his shortness of breath CT angiogram of the chest was done which shows left-sided pulmonary embolus extending to his left side of the pulmonary artery with no saddle embolus.  Patient was referred to the ER.  ED Course: In the ER patient is hemodynamically stable and not hypoxic patient was started on IV heparin admitted for further management of acute pulmonary embolism.  Covid test was negative hemoglobin is 12.8 at baseline.  Review of Systems: As per HPI, rest all negative.   Past Medical History:  Diagnosis Date   Chickenpox    Chronic knee pain    Chronic low back pain    Essential hypertension    GERD (gastroesophageal reflux disease)    OSA (obstructive sleep apnea)    Testosterone deficiency    Type 2 diabetes mellitus ([place])     Past Surgical History:  Procedure Laterality Date   COLONOSCOPY WITH PROPOFOL N/A 12/17/2018   Procedure: COLONOSCOPY WITH PROPOFOL;  Surgeon: Virgel Manifold, MD;  Location: [facility] ENDOSCOPY;  Service: Gastroenterology;  Laterality: N/A;   IR IMAGING GUIDED PORT INSERTION  04/17/2020   JOINT REPLACEMENT Bilateral    REPLACEMENT TOTAL KNEE BILATERAL  2015   TONSILLECTOMY  1960     reports that he has quit smoking. He has never used smokeless tobacco. He reports current alcohol use. He reports previous drug use.  Allergies  Allergen Reactions   Bupropion     Racing heart    Family  History  Problem Relation Age of Onset   Cancer Mother    Hypertension Mother    Arthritis Father    Asthma Father    Cancer Father    COPD Father    Heart attack Father    Hypertension Sister    Cancer Sister    Diabetes Sister    Asthma Son    Birth defects Maternal Grandfather    Arthritis Paternal Grandmother    Diabetes Paternal Grandmother    Arthritis Paternal Grandfather    Asthma Sister    Cancer Sister    COPD Sister    Arthritis Sister    Asthma Sister    Diabetes Sister    Other Neg Hx        pituitary disorder    Prior to Admission medications   Medication Sig Start Date End Date Taking? Authorizing Provider  acetaminophen (TYLENOL) 500 MG tablet Take 1,500 mg by mouth every 8 (eight) hours as needed for moderate pain or headache.   Yes [provider]  albuterol (VENTOLIN HFA) 108 (90 Base) MCG/ACT inhaler Inhale 2 puffs into the lungs every 6 (six) hours as needed for wheezing or shortness of breath. 03/14/20  Yes Nicolette Bang, DO  amLODipine (NORVASC) 10 MG tablet TAKE 1 TABLET BY MOUTH EVERY DAY Patient taking differently: Take 10 mg by mouth daily.  02/03/20  Yes [name], NP  aspirin EC 81 MG tablet Take 81 mg by mouth daily.  Yes [provider]  Coenzyme Q10 ([employer] COQ-10) 200 MG capsule Take 200 mg by mouth every evening.    Yes [provider]  esomeprazole (NEXIUM) 20 MG capsule Take 20 mg by mouth daily.    Yes [provider]  fluticasone (FLONASE) 50 MCG/ACT nasal spray Place 2 sprays into both nostrils daily as needed for allergies.  03/16/17  Yes [provider]  gabapentin (NEURONTIN) 300 MG capsule Take 300 mg by mouth 2 (two) times daily.  10/03/19  Yes [provider]  glipiZIDE (GLUCOTROL) 10 MG tablet TAKE 1 TABLET (10 MG TOTAL) BY MOUTH 2 (TWO) TIMES DAILY BEFORE A MEAL. FOR DIABETES. 12/07/19  Yes [name], NP  HYDROcodone-homatropine  [facility]) 5-1.5 MG/5ML syrup Take 5 mLs by mouth every 8 (eight) hours as needed for cough. Patient taking differently: Take 5 mLs by mouth at bedtime as needed for cough.  03/30/20  Yes Curt Bears, MD  KRILL OIL PO Take 350 mg by mouth daily.   Yes [provider]  lidocaine-prilocaine (EMLA) cream Apply 1 application topically as needed. Patient taking differently: Apply 1 application topically as needed (access port).  04/19/20  Yes Curt Bears, MD  lisinopril (ZESTRIL) 5 MG tablet TAKE 1 TABLET BY MOUTH EVERY DAY Patient taking differently: Take 5 mg by mouth daily.  02/03/20  Yes [name], NP  Melatonin 10 MG CAPS Take 10 mg by mouth at bedtime.   Yes [provider]  metFORMIN (GLUCOPHAGE) 1000 MG tablet TAKE 1 TABLET (1,000 MG TOTAL) BY MOUTH 2 (TWO) TIMES DAILY WITH A MEAL. 03/22/20  Yes [name], NP  Multiple Vitamin (MULTI-VITAMINS) TABS Take 1 tablet by mouth daily.    Yes [provider]  oxymetazoline (AFRIN) 0.05 % nasal spray Place 1 spray into both nostrils 2 (two) times daily as needed for congestion.   Yes [provider]  pioglitazone (ACTOS) 45 MG tablet TAKE 1 TABLET BY MOUTH EVERY DAY Patient taking differently: Take 45 mg by mouth daily.  02/03/20  Yes [name], NP  pravastatin (PRAVACHOL) 40 MG tablet TAKE 1 TABLET BY MOUTH EVERY DAY IN THE EVENING FOR CHOLESTEROL Patient taking differently: Take 40 mg by mouth daily.  02/03/20  Yes [name], NP  prochlorperazine (COMPAZINE) 10 MG tablet Take 1 tablet (10 mg total) by mouth every 6 (six) hours as needed for nausea or vomiting. 04/19/20  Yes Curt Bears, MD  sildenafil (VIAGRA) 50 MG tablet TAKE 1 TABLET BY MOUTH EVERY DAY AS NEEDED Patient taking differently: Take 50 mg by mouth as needed for erectile dysfunction.  09/06/18  Yes [name], NP  sucralfate (CARAFATE) 1 g tablet Take 1 tablet (1 g total) by mouth 4 (four) times daily.  Dissolve each tablet in 15 cc water before use. Patient taking differently: Take 1 g by mouth. Dissolve each tablet in 15 cc water before use. 04/27/20  Yes Kyung Rudd, MD  testosterone cypionate (DEPOTESTOSTERONE CYPIONATE) 200 MG/ML injection Inject 200 mg into the muscle every 14 (fourteen) days.   Yes [provider]  Black Pepper-Turmeric (TURMERIC COMPLEX/BLACK PEPPER PO) Take 1 tablet by mouth in the morning and at bedtime.    [provider]  blood glucose meter kit and supplies KIT Dispense based on patient and insurance preference. Use up to four times daily as directed. 04/09/18   [name], NP  Lancets MISC USE UP TO 3 TIMES DAILY AS DIRECTED 05/06/18  [name], NP  ONETOUCH ULTRA test strip USE UP TO 4 TIMES DAILY AS DIRECTED 02/07/20   [name], NP  SYRINGE-NEEDLE, DISP, 3 ML (B-D 3CC LUER-LOK SYR 22GX1") 22G X 1" 3 ML MISC USE AS INSTRUCTED FOR TESTOSTERONE INJECTION EVERY 2 WEEKS 04/14/20   [name], NP    Physical Exam: Constitutional: Moderately built and nourished. Vitals:   05/15/20 1930 05/15/20 2015 05/15/20 2100 05/15/20 2105  BP: (!) 135/91 139/90 130/83   Pulse: 99 98 95   Resp: 18 (!) 21 (!) 21   Temp:      TempSrc:      SpO2: 93% 94% 92% 94%   Eyes: Anicteric no pallor. ENMT: No discharge from the ears eyes nose or mouth. Neck: No mass felt.  No neck rigidity. Respiratory: No rhonchi or crepitations. Cardiovascular: S1-S2 heard. Abdomen: Soft nontender bowel sounds present. Musculoskeletal: No edema. Skin: No rash. Neurologic: Alert awake oriented to time place and person.  Moves all extremities. Psychiatric: Appears normal per normal affect.   Labs on Admission: I have personally reviewed following labs and imaging studies  CBC: Recent Labs  Lab 05/15/20 1045  WBC 5.3  NEUTROABS 4.0  HGB 12.8*  HCT 39.7  MCV 89.0  PLT 202   Basic Metabolic Panel: Recent Labs  Lab 05/15/20 1045  NA 139    K 4.3  CL 104  CO2 24  GLUCOSE 229*  BUN 11  CREATININE 1.16  CALCIUM 8.5*   GFR: Estimated Creatinine Clearance: 76.8 mL/min (by C-G formula based on SCr of 1.16 mg/dL). Liver Function Tests: Recent Labs  Lab 05/15/20 1045  AST 13*  ALT 18  ALKPHOS 111  BILITOT 0.3  PROT 6.0*  ALBUMIN 3.1*   No results for input(s): LIPASE, AMYLASE in the last 168 hours. No results for input(s): AMMONIA in the last 168 hours. Coagulation Profile: Recent Labs  Lab 05/15/20 1957  INR 1.0   Cardiac Enzymes: No results for input(s): CKTOTAL, CKMB, CKMBINDEX, TROPONINI in the last 168 hours. BNP (last 3 results) Recent Labs    03/26/20 0936  PROBNP 41.0   HbA1C: No results for input(s): HGBA1C in the last 72 hours. CBG: No results for input(s): GLUCAP in the last 168 hours. Lipid Profile: No results for input(s): CHOL, HDL, LDLCALC, TRIG, CHOLHDL, LDLDIRECT in the last 72 hours. Thyroid Function Tests: Recent Labs    05/15/20 1045  TSH 0.703   Anemia Panel: No results for input(s): VITAMINB12, FOLATE, FERRITIN, TIBC, IRON, RETICCTPCT in the last 72 hours. Urine analysis:    Component Value Date/Time   COLORURINE YELLOW 11/25/2017 [place] 11/25/2017 1359   LABSPEC 1.020 11/25/2017 1359   PHURINE 6.0 11/25/2017 1359   GLUCOSEU 100 (A) 11/25/2017 1359   HGBUR NEGATIVE 11/25/2017 1359   BILIRUBINUR NEGATIVE 11/25/2017 1359   KETONESUR NEGATIVE 11/25/2017 1359   UROBILINOGEN 0.2 11/25/2017 1359   NITRITE NEGATIVE 11/25/2017 1359   LEUKOCYTESUR NEGATIVE 11/25/2017 1359   Sepsis Labs: '@LABRCNTIP'$ (procalcitonin:4,lacticidven:4) )No results found for this or any previous visit (from the past 240 hour(s)).   Radiological Exams on Admission: CT ANGIO CHEST PE W OR WO CONTRAST  Addendum Date: 05/15/2020   ADDENDUM REPORT: 05/15/2020 18:15 ADDENDUM: Critical Value/emergent results were called by telephone at the time of interpretation on 05/15/2020 at 6:10 pm to  on-call provider DR. KALE, who verbally acknowledged these results. Patient was instructed to proceed to the [facility] for further evaluation and treatment  at this time. Electronically Signed   By: Ilona Sorrel M.D.   On: 05/15/2020 18:15   Result Date: 05/15/2020 CLINICAL DATA:  Stage IV right lung cancer with ongoing chemotherapy. Dyspnea for 3 days. EXAM: CT ANGIOGRAPHY CHEST WITH CONTRAST TECHNIQUE: Multidetector CT imaging of the chest was performed using the standard protocol during bolus administration of intravenous contrast. Multiplanar CT image reconstructions and MIPs were obtained to evaluate the vascular anatomy. CONTRAST:  171m OMNIPAQUE IOHEXOL 350 MG/ML SOLN COMPARISON:  03/28/2020 PET-CT.  03/26/2020 chest CT. FINDINGS: Cardiovascular: The study is high quality for the evaluation of pulmonary embolism. There is acute left-sided pulmonary embolism involving the distal left pulmonary artery and lobar, segmental and subsegmental left upper and left lower lobe branches. No saddle embolus. Atherosclerotic nonaneurysmal thoracic aorta. Dilated main pulmonary artery (3.8 cm diameter), unchanged. Top-normal heart size. No significant pericardial fluid/thickening. Three-vessel coronary atherosclerosis. Right internal jugular Port-A-Cath terminates at the cavoatrial junction. Mediastinum/Nodes: No discrete thyroid nodules. Unremarkable esophagus. No axillary adenopathy. Enlarged 1.5 cm right pericardiophrenic node (series 5/image 157), previously 1.6 cm, not appreciably changed. Short axis diameter 3.5 cm enlarged right paratracheal node (series 5/image 93), previously 4.6 cm using similar measurement technique, decreased. Enlarged 2.1 cm subcarinal node (series 5/image 116), decreased from 3.7 cm. Previously enlarged 1.4 cm left paratracheal node is decreased to 00.9 cm (series 5/image 96). Lungs/Pleura: No pneumothorax. Small dependent right pleural effusion, stable. No left pleural effusion. Poorly  marginated central right upper lobe 6.3 x 3.3 cm lung mass (series 6/image 51), decreased from approximately 10.1 x 6.9 cm using similar measurement technique. Persistent patchy bandlike and nodular foci of consolidation throughout the peripheral right upper lobe, decreased. Interlobular septal thickening throughout the right lung is increased. No new significant pulmonary nodules. Upper abdomen: Numerous (greater than 10) small hypodense liver lesions scattered throughout the visualized liver, decreased since 03/28/2020 PET-CT, for example measuring 1.2 cm in the segment 4A left liver lobe (series 4/image 74), previously 2.3 cm. Exophytic simple 3.4 cm lateral upper right renal cyst. Musculoskeletal: No aggressive appearing focal osseous lesions. Marked thoracic spondylosis. Review of the MIP images confirms the above findings. IMPRESSION: 1. Acute left-sided pulmonary embolism extending proximally to the distal left pulmonary artery. No saddle embolus. 2. Positive interval response to therapy. Decreased liver metastases. Decreased mediastinal lymphadenopathy. Poorly marginated central right upper lobe lung mass is decreased. Decreased nonspecific peripheral right upper lobe patchy bandlike and nodular foci of consolidation compatible with a combination of postobstructive pneumonia, atelectasis and/or tumor. 3. Interlobular septal thickening throughout the right lung is increased, nonspecific, potentially asymmetric pulmonary edema, post treatment change and/or lymphangitic tumor, suggestive fashion on short-term follow-up chest CT. 4. Stable small dependent right pleural effusion. 5. Stable dilated main pulmonary artery, suggesting chronic pulmonary arterial hypertension. 6. Aortic Atherosclerosis (ICD10-I70.0). Electronically Signed: By: JIlona SorrelM.D. On: 05/15/2020 18:07     Assessment/Plan Principal Problem:   Pulmonary embolism (HCC) Active Problems:   Essential hypertension   Type 2 diabetes  mellitus with complication, without long-term current use of insulin (HCC)   GERD (gastroesophageal reflux disease)   [place]    1. Acute pulmonary embolism hemodynamically stable in the setting of malignancy likely provoking patient started on heparin.  If continues to remain hemodynamically stable will change to Lovenox or will discuss with oncologist.  Check 2D echo Dopplers of the lower extremity check cardiac markers BNP. 2. Diabetes mellitus type 2 we'll keep patient on sliding  scale coverage. 3. Hypertension on amlodipine and lisinopril.  Closely monitor blood pressure trends. 4. Extensive stage small cell lung cancer presently receiving chemotherapy has revision previously recently.  Being followed by Dr. Julien Nordmann oncologist.  Since patient has significant exertional symptoms.  Pulmonary embolism in the setting of malignancy will need close monitoring for any deterioration in 2 midnight stay in inpatient status.   DVT prophylaxis: Heparin infusion. Code Status: Full code. Family Communication: Patient's wife at the bedside. Disposition Plan: Home. Consults called: None. Admission status: Inpatient.   Rise Patience MD [facility] Pager [phone].  If 7PM-7AM, please contact night-coverage [email or web address] Password [facility]  05/15/2020, 9:08 PM

## 2020-05-15 NOTE — ED Provider Notes (Signed)
[facility] DEPT Provider Note   CSN: [id number] Arrival date & time: 05/15/20  1839     History Chief Complaint  Patient presents with  . Blood clot  . Shortness of Breath    Adam Wise is a 69 y.o. male.  HPI Patient with history of lung cancer, getting chemo reports 3 days of SOB, worse with walking. Denies chest pain. No fever or change in chronic cough. At Hem/Onc today he was noted to be hypoxic after walking. He completed his chemo treatment today and then was sent for a CTA which showed a large PE, not saddle emoblus. Called and told to come to the ED, noted to by hypoxic on RA (88%) on arrival here.     Past Medical History:  Diagnosis Date  . Chickenpox   . Chronic knee pain   . Chronic low back pain   . Essential hypertension   . GERD (gastroesophageal reflux disease)   . OSA (obstructive sleep apnea)   . Testosterone deficiency   . Type 2 diabetes mellitus [facility])     Patient Active Problem List   Diagnosis Date Noted  . Pulmonary embolism ([place]) 05/15/2020  . Small cell lung cancer, right upper lobe ([place]) 04/12/2020  . Chronic fatigue 04/12/2020  . Encounter for antineoplastic chemotherapy 04/12/2020  . Encounter for antineoplastic immunotherapy 04/12/2020  . Malignant neoplasm of upper lobe of right lung ([place]) 04/04/2020  . Goals of care, counseling/discussion 03/30/2020  . Shortness of breath 03/26/2020  . Hyperlipidemia 11/08/2019  . Joint swelling 11/16/2018  . 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 Essential hypertension 09/09/2017  . Type 2 diabetes mellitus with complication, without long-term current use of insulin ([place]) 09/09/2017  . GERD (gastroesophageal reflux disease) 09/09/2017  . Testosterone deficiency 09/09/2017  . OSA (obstructive sleep apnea) 09/09/2017  . Chronic bilateral low back pain without sciatica 09/09/2017  . H/O knee surgery 04/27/2014  . 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 COLONOSCOPY WITH PROPOFOL N/A 12/17/2018   Procedure: COLONOSCOPY WITH PROPOFOL;  Surgeon: Virgel Manifold, MD;  Location: [facility] ENDOSCOPY;  Service: Gastroenterology;  Laterality: N/A;  . IR IMAGING GUIDED PORT INSERTION  04/17/2020  . JOINT REPLACEMENT Bilateral   . REPLACEMENT TOTAL KNEE BILATERAL  2015  . TONSILLECTOMY  1960       Family History  Problem Relation Age of Onset  . Cancer Mother   . Hypertension Mother   . Arthritis Father   . Asthma Father   . Cancer Father   . COPD Father   . Heart attack Father   . Hypertension Sister   . Cancer Sister   . Diabetes Sister   . Asthma Son   . Birth defects Maternal Grandfather   . Arthritis Paternal Grandmother   . Diabetes Paternal Grandmother   . Arthritis Paternal Grandfather   . Asthma Sister   . Cancer Sister   . COPD Sister   . Arthritis Sister   . Asthma Sister   . Diabetes Sister   . Other Neg Hx        pituitary disorder    Social History   Tobacco Use  . Smoking status: Former Research scientist (life sciences)  . Smokeless tobacco: Never Used  Substance Use Topics  . Alcohol use: Yes    Comment: rarely  . Drug use: Not Currently    Home Medications Prior to Admission medications   Medication Sig Start Date End Date  Taking? Authorizing Provider  acetaminophen (TYLENOL) 500 MG tablet Take 1,500 mg by mouth every 8 (eight) hours as needed for moderate pain or headache.   Yes [provider]  albuterol (VENTOLIN HFA) 108 (90 Base) MCG/ACT inhaler Inhale 2 puffs into the lungs every 6 (six) hours as needed for wheezing or shortness of breath. 03/14/20  Yes Nicolette Bang, DO  amLODipine (NORVASC) 10 MG tablet TAKE 1 TABLET BY MOUTH EVERY DAY Patient taking differently: Take 10 mg by mouth daily.  02/03/20  Yes [name], NP  aspirin EC 81 MG tablet Take 81 mg by mouth daily.    Yes [provider]  Coenzyme Q10 ([employer] COQ-10) 200 MG capsule Take  200 mg by mouth every evening.    Yes [provider]  esomeprazole (NEXIUM) 20 MG capsule Take 20 mg by mouth daily.    Yes [provider]  fluticasone (FLONASE) 50 MCG/ACT nasal spray Place 2 sprays into both nostrils daily as needed for allergies.  03/16/17  Yes [provider]  gabapentin (NEURONTIN) 300 MG capsule Take 300 mg by mouth 2 (two) times daily.  10/03/19  Yes [provider]  glipiZIDE (GLUCOTROL) 10 MG tablet TAKE 1 TABLET (10 MG TOTAL) BY MOUTH 2 (TWO) TIMES DAILY BEFORE A MEAL. FOR DIABETES. 12/07/19  Yes [name], NP  HYDROcodone-homatropine [facility]) 5-1.5 MG/5ML syrup Take 5 mLs by mouth every 8 (eight) hours as needed for cough. Patient taking differently: Take 5 mLs by mouth at bedtime as needed for cough.  03/30/20  Yes Curt Bears, MD  KRILL OIL PO Take 350 mg by mouth daily.   Yes [provider]  lidocaine-prilocaine (EMLA) cream Apply 1 application topically as needed. Patient taking differently: Apply 1 application topically as needed (access port).  04/19/20  Yes Curt Bears, MD  lisinopril (ZESTRIL) 5 MG tablet TAKE 1 TABLET BY MOUTH EVERY DAY Patient taking differently: Take 5 mg by mouth daily.  02/03/20  Yes [name], NP  Melatonin 10 MG CAPS Take 10 mg by mouth at bedtime.   Yes [provider]  metFORMIN (GLUCOPHAGE) 1000 MG tablet TAKE 1 TABLET (1,000 MG TOTAL) BY MOUTH 2 (TWO) TIMES DAILY WITH A MEAL. 03/22/20  Yes [name], NP  Multiple Vitamin (MULTI-VITAMINS) TABS Take 1 tablet by mouth daily.    Yes [provider]  oxymetazoline (AFRIN) 0.05 % nasal spray Place 1 spray into both nostrils 2 (two) times daily as needed for congestion.   Yes [provider]  pioglitazone (ACTOS) 45 MG tablet TAKE 1 TABLET BY MOUTH EVERY DAY Patient taking differently: Take 45 mg by mouth daily.  02/03/20  Yes [name], NP  pravastatin (PRAVACHOL) 40 MG tablet  TAKE 1 TABLET BY MOUTH EVERY DAY IN THE EVENING FOR CHOLESTEROL Patient taking differently: Take 40 mg by mouth daily.  02/03/20  Yes [name], NP  prochlorperazine (COMPAZINE) 10 MG tablet Take 1 tablet (10 mg total) by mouth every 6 (six) hours as needed for nausea or vomiting. 04/19/20  Yes Curt Bears, MD  sildenafil (VIAGRA) 50 MG tablet TAKE 1 TABLET BY MOUTH EVERY DAY AS NEEDED Patient taking differently: Take 50 mg by mouth as needed for erectile dysfunction.  09/06/18  Yes [name], NP  sucralfate (CARAFATE) 1 g tablet Take 1 tablet (1 g total) by mouth 4 (four) times daily. Dissolve each tablet in 15 cc water before use. Patient taking differently:  Take 1 g by mouth. Dissolve each tablet in 15 cc water before use. 04/27/20  Yes Kyung Rudd, MD  testosterone cypionate (DEPOTESTOSTERONE CYPIONATE) 200 MG/ML injection Inject 200 mg into the muscle every 14 (fourteen) days.   Yes [provider]  Black Pepper-Turmeric (TURMERIC COMPLEX/BLACK PEPPER PO) Take 1 tablet by mouth in the morning and at bedtime.    [provider]  blood glucose meter kit and supplies KIT Dispense based on patient and insurance preference. Use up to four times daily as directed. 04/09/18   [name], NP  Lancets MISC USE UP TO 3 TIMES DAILY AS DIRECTED 05/06/18   [name], NP  [facility] ULTRA test strip USE UP TO 4 TIMES DAILY AS DIRECTED 02/07/20   [name], NP  SYRINGE-NEEDLE, DISP, 3 ML (B-D 3CC LUER-LOK SYR 22GX1") 22G X 1" 3 ML MISC USE AS INSTRUCTED FOR TESTOSTERONE INJECTION EVERY 2 WEEKS 04/14/20   [name], NP    Allergies    Bupropion  Review of Systems   Review of Systems A comprehensive review of systems was completed and negative except as noted in HPI.   Physical Exam Updated Vital Signs BP 139/90   Pulse 98   Temp 98.8 F (37.1 C) (Oral)   Resp (!) 21   SpO2 94%   Physical Exam Vitals and nursing note reviewed.    Constitutional:      Appearance: Normal appearance.  HENT:     Head: Normocephalic and atraumatic.     Nose: Nose normal.     Mouth/Throat:     Mouth: Mucous membranes are moist.  Eyes:     Extraocular Movements: Extraocular movements intact.     Conjunctiva/sclera: Conjunctivae normal.  Cardiovascular:     Rate and Rhythm: Tachycardia present.  Pulmonary:     Effort: Pulmonary effort is normal.     Breath sounds: Normal breath sounds.  Abdominal:     General: Abdomen is flat.     Palpations: Abdomen is soft.     Tenderness: There is no abdominal tenderness.  Musculoskeletal:        General: No swelling. Normal range of motion.     Cervical back: Neck supple.  Skin:    General: Skin is warm and dry.  Neurological:     General: No focal deficit present.     Mental Status: He is alert.  Psychiatric:        Mood and Affect: Mood normal.     ED Results / Procedures / Treatments   Labs (all labs ordered are listed, but only abnormal results are displayed) Labs Reviewed  SARS CORONAVIRUS 2 BY RT PCR (HOSPITAL ORDER, [facility])  APTT  PROTIME-INR  HEPARIN LEVEL (UNFRACTIONATED)  CBC    EKG None  Radiology CT ANGIO CHEST PE W OR WO CONTRAST  Addendum Date: 05/15/2020   ADDENDUM REPORT: 05/15/2020 18:15 ADDENDUM: Critical Value/emergent results were called by telephone at the time of interpretation on 05/15/2020 at 6:10 pm to on-call provider DR. KALE, who verbally acknowledged these results. Patient was instructed to proceed to the [facility] for further evaluation and treatment at this time. Electronically Signed   By: Ilona Sorrel M.D.   On: 05/15/2020 18:15   Result Date: 05/15/2020 CLINICAL DATA:  Stage IV right lung cancer with ongoing chemotherapy. Dyspnea for 3 days. EXAM: CT ANGIOGRAPHY CHEST WITH CONTRAST TECHNIQUE: Multidetector CT imaging of the chest was performed using the  standard protocol during bolus administration of  intravenous contrast. Multiplanar CT image reconstructions and MIPs were obtained to evaluate the vascular anatomy. CONTRAST:  159m OMNIPAQUE IOHEXOL 350 MG/ML SOLN COMPARISON:  03/28/2020 PET-CT.  03/26/2020 chest CT. FINDINGS: Cardiovascular: The study is high quality for the evaluation of pulmonary embolism. There is acute left-sided pulmonary embolism involving the distal left pulmonary artery and lobar, segmental and subsegmental left upper and left lower lobe branches. No saddle embolus. Atherosclerotic nonaneurysmal thoracic aorta. Dilated main pulmonary artery (3.8 cm diameter), unchanged. Top-normal heart size. No significant pericardial fluid/thickening. Three-vessel coronary atherosclerosis. Right internal jugular Port-A-Cath terminates at the cavoatrial junction. Mediastinum/Nodes: No discrete thyroid nodules. Unremarkable esophagus. No axillary adenopathy. Enlarged 1.5 cm right pericardiophrenic node (series 5/image 157), previously 1.6 cm, not appreciably changed. Short axis diameter 3.5 cm enlarged right paratracheal node (series 5/image 93), previously 4.6 cm using similar measurement technique, decreased. Enlarged 2.1 cm subcarinal node (series 5/image 116), decreased from 3.7 cm. Previously enlarged 1.4 cm left paratracheal node is decreased to 00.9 cm (series 5/image 96). Lungs/Pleura: No pneumothorax. Small dependent right pleural effusion, stable. No left pleural effusion. Poorly marginated central right upper lobe 6.3 x 3.3 cm lung mass (series 6/image 51), decreased from approximately 10.1 x 6.9 cm using similar measurement technique. Persistent patchy bandlike and nodular foci of consolidation throughout the peripheral right upper lobe, decreased. Interlobular septal thickening throughout the right lung is increased. No new significant pulmonary nodules. Upper abdomen: Numerous (greater than 10) small hypodense liver lesions scattered throughout the visualized liver, decreased since  03/28/2020 PET-CT, for example measuring 1.2 cm in the segment 4A left liver lobe (series 4/image 74), previously 2.3 cm. Exophytic simple 3.4 cm lateral upper right renal cyst. Musculoskeletal: No aggressive appearing focal osseous lesions. Marked thoracic spondylosis. Review of the MIP images confirms the above findings. IMPRESSION: 1. Acute left-sided pulmonary embolism extending proximally to the distal left pulmonary artery. No saddle embolus. 2. Positive interval response to therapy. Decreased liver metastases. Decreased mediastinal lymphadenopathy. Poorly marginated central right upper lobe lung mass is decreased. Decreased nonspecific peripheral right upper lobe patchy bandlike and nodular foci of consolidation compatible with a combination of postobstructive pneumonia, atelectasis and/or tumor. 3. Interlobular septal thickening throughout the right lung is increased, nonspecific, potentially asymmetric pulmonary edema, post treatment change and/or lymphangitic tumor, suggestive fashion on short-term follow-up chest CT. 4. Stable small dependent right pleural effusion. 5. Stable dilated main pulmonary artery, suggesting chronic pulmonary arterial hypertension. 6. Aortic Atherosclerosis (ICD10-I70.0). Electronically Signed: By: JIlona SorrelM.D. On: 05/15/2020 18:07    Procedures .Critical Care Performed by: STruddie Hidden MD Authorized by: STruddie Hidden MD   Critical care provider statement:    Critical care time (minutes):  35   Critical care time was exclusive of:  Separately billable procedures and treating other patients   Critical care was necessary to treat or prevent imminent or life-threatening deterioration of the following conditions:  Respiratory failure and circulatory failure   Critical care was time spent personally by me on the following activities:  Development of treatment plan with patient or surrogate, discussions with consultants, evaluation of patient's response to  treatment, examination of patient, ordering and review of laboratory studies, ordering and review of radiographic studies, pulse oximetry and re-evaluation of patient's condition   (including critical care time)  Medications Ordered in ED Medications  heparin ADULT infusion 100 units/mL (25000 units/2548msodium chloride 0.45%) (1,650 Units/hr Intravenous New Bag/Given 05/15/20 2012)  heparin bolus via infusion  3,000 Units (3,000 Units Intravenous Bolus from Bag 05/15/20 2013)    ED Course  I have reviewed the triage vital signs and the nursing notes.  Pertinent labs & imaging results that were available during my care of the patient were reviewed by me and considered in my medical decision making (see chart for details).    MDM Rules/Calculators/A&P                      CTA images and results reviewed, Labs done at cancer center reviewed. Given tachycardia and hypoxia, will discuss admission with Hospitalist.   Final Clinical Impression(s) / ED Diagnoses Final diagnoses:  Other acute pulmonary embolism, unspecified whether acute cor pulmonale present [facility])    Rx / DC Orders ED Discharge Orders    None       Truddie Hidden, MD 05/15/20 2044

## 2020-05-15 NOTE — Patient Instructions (Signed)

## 2020-05-15 NOTE — Progress Notes (Signed)
ANTICOAGULATION CONSULT NOTE - Initial Consult  Pharmacy Consult for IV heparin Indication: pulmonary embolus  Allergies  Allergen Reactions  . Bupropion     Racing heart    Patient Measurements:   Heparin Dosing Weight: 98 kg  Vital Signs: Temp: 98.8 F (37.1 C) (06/01 1842) Temp Source: Oral (06/01 1842) BP: 135/91 (06/01 1930) Pulse Rate: 99 (06/01 1930)  Labs: Recent Labs    05/15/20 1045  HGB 12.8*  HCT 39.7  PLT 233  CREATININE 1.16    Estimated Creatinine Clearance: 76.8 mL/min (by C-G formula based on SCr of 1.16 mg/dL).   Medical History: Past Medical History:  Diagnosis Date  . Chickenpox   . Chronic knee pain   . Chronic low back pain   . Essential hypertension   . GERD (gastroesophageal reflux disease)   . OSA (obstructive sleep apnea)   . Testosterone deficiency   . Type 2 diabetes mellitus (HCC)     Medications:  Scheduled:  Infusions:   Assessment: 69 yo male at [facility] today getting C2 of carbo/etoposide with Imfinzi for SCLC and developed shortness of breath. Had CT done and found new PE. To start IV heparin per Rx. On no anticoagulation prior to admission. Baseline CBC and SCr already done.   Goal of Therapy:  Heparin level 0.3-0.7 units/ml Monitor platelets by anticoagulation protocol: Yes   Plan:  1) IV heparin bolus of 3000 units then 2) IV heparin infusion rate of 1650 units/hr 3) Check heparin level 6 hours after start of IV heparin 4) Daily CBC   Kara Mead 05/15/2020,7:54 PM

## 2020-05-15 NOTE — Plan of Care (Signed)
  Problem: Education: Goal: Ability to describe self-care measures that may prevent or decrease complications (Diabetes Survival Skills Education) will improve Outcome: Progressing Goal: Individualized Educational Video(s) Outcome: Progressing   Problem: Health Behavior/Discharge Planning: Goal: Ability to identify and utilize available resources and services will improve Outcome: Progressing Goal: Ability to manage health-related needs will improve Outcome: Progressing

## 2020-05-15 NOTE — Progress Notes (Signed)
Per report from Abelina Bachelor RN, patient will have CTA of chest after treatment today. Before beginning durvalumab infusion, this RN spoke with Dr. Julien Nordmann regarding patient's dyspnea. Dr. Julien Nordmann advised to continue with durvalumab.

## 2020-05-16 ENCOUNTER — Encounter: Payer: Self-pay | Admitting: Internal Medicine

## 2020-05-16 ENCOUNTER — Inpatient Hospital Stay: Payer: Medicare Other

## 2020-05-16 ENCOUNTER — Inpatient Hospital Stay (HOSPITAL_BASED_OUTPATIENT_CLINIC_OR_DEPARTMENT_OTHER): Payer: Medicare Other

## 2020-05-16 DIAGNOSIS — E118 Type 2 diabetes mellitus with unspecified complications: Secondary | ICD-10-CM | POA: Diagnosis not present

## 2020-05-16 DIAGNOSIS — R0602 Shortness of breath: Secondary | ICD-10-CM | POA: Diagnosis not present

## 2020-05-16 DIAGNOSIS — C3411 Malignant neoplasm of upper lobe, right bronchus or lung: Secondary | ICD-10-CM | POA: Diagnosis not present

## 2020-05-16 DIAGNOSIS — I2609 Other pulmonary embolism with acute cor pulmonale: Secondary | ICD-10-CM

## 2020-05-16 DIAGNOSIS — I2699 Other pulmonary embolism without acute cor pulmonale: Secondary | ICD-10-CM | POA: Diagnosis not present

## 2020-05-16 DIAGNOSIS — I10 Essential (primary) hypertension: Secondary | ICD-10-CM | POA: Diagnosis not present

## 2020-05-16 LAB — BASIC METABOLIC PANEL
Anion gap: 10 (ref 5–15)
BUN: 15 mg/dL (ref 8–23)
CO2: 27 mmol/L (ref 22–32)
Calcium: 8.1 mg/dL — ABNORMAL LOW (ref 8.9–10.3)
Chloride: 101 mmol/L (ref 98–111)
Creatinine, Ser: 1.22 mg/dL (ref 0.61–1.24)
GFR calc Af Amer: >60 mL/min (ref >60)
GFR calc non Af Amer: >60 mL/min (ref >60)
Glucose, Bld: 248 mg/dL — ABNORMAL HIGH (ref 70–99)
Potassium: 4.4 mmol/L (ref 3.5–5.1)
Sodium: 138 mmol/L (ref 135–145)

## 2020-05-16 LAB — TROPONIN I (HIGH SENSITIVITY): Troponin I (High Sensitivity): 7 ng/L (ref <18)

## 2020-05-16 LAB — CBC
HCT: 37.3 % — ABNORMAL LOW (ref 39.0–52.0)
Hemoglobin: 12.1 g/dL — ABNORMAL LOW (ref 13.0–17.0)
MCH: 29.6 pg (ref 26.0–34.0)
MCHC: 32.4 g/dL (ref 30.0–36.0)
MCV: 91.2 fL (ref 80.0–100.0)
Platelets: 248 10*3/uL (ref 150–400)
RBC: 4.09 MIL/uL — ABNORMAL LOW (ref 4.22–5.81)
RDW: 14.9 % (ref 11.5–15.5)
WBC: 5.9 10*3/uL (ref 4.0–10.5)
nRBC: 0 % (ref 0.0–0.2)

## 2020-05-16 LAB — GLUCOSE, CAPILLARY
Glucose-Capillary: 203 mg/dL — ABNORMAL HIGH (ref 70–99)
Glucose-Capillary: 190 mg/dL — ABNORMAL HIGH (ref 70–99)
Glucose-Capillary: 183 mg/dL — ABNORMAL HIGH (ref 70–99)
Glucose-Capillary: 174 mg/dL — ABNORMAL HIGH (ref 70–99)

## 2020-05-16 LAB — HEPARIN LEVEL (UNFRACTIONATED)
Heparin Unfractionated: 0.37 IU/mL (ref 0.30–0.70)
Heparin Unfractionated: 0.34 IU/mL (ref 0.30–0.70)

## 2020-05-16 LAB — ECHOCARDIOGRAM COMPLETE
Height: 69 in
Weight: 4176.39 oz

## 2020-05-16 LAB — HIV ANTIBODY (ROUTINE TESTING W REFLEX): HIV Screen 4th Generation wRfx: NONREACTIVE

## 2020-05-16 MED ORDER — APIXABAN 5 MG PO TABS
10.0000 mg | ORAL_TABLET | Freq: Two times a day (BID) | ORAL | Status: DC
  Administered 2020-05-16 – 2020-05-17 (×3): 10 mg via ORAL
  Filled 2020-05-16 (×3): qty 2

## 2020-05-16 MED ORDER — HEPARIN (PORCINE) 25000 UT/250ML-% IV SOLN: 1800.0000 [IU]/h | INTRAVENOUS | Status: DC

## 2020-05-16 MED ORDER — HYDROCODONE-HOMATROPINE 5-1.5 MG/5ML PO SYRP: 5.0000 mL | ORAL_SOLUTION | Freq: Every evening | ORAL | Status: DC | PRN

## 2020-05-16 MED ORDER — APIXABAN 5 MG PO TABS: 5.0000 mg | ORAL_TABLET | Freq: Two times a day (BID) | ORAL | Status: DC

## 2020-05-16 MED ORDER — APIXABAN (ELIQUIS) VTE STARTER PACK (10MG AND 5MG)
ORAL_TABLET | ORAL | 0 refills | Status: DC
Start: 2020-05-16 — End: 2020-08-14

## 2020-05-16 NOTE — Progress Notes (Addendum)
SATURATION QUALIFICATIONS: (This note is used to comply with regulatory documentation for home oxygen)  Patient Saturations on Room Air at Rest = 92%  Patient Saturations on Room Air while Ambulating = 85%  Patient Saturations on 3 Liters of oxygen while Ambulating = 91%  Please briefly explain why patient needs home oxygen:  Patient's oxygen saturation drops to 85% while ambulating on room air.

## 2020-05-16 NOTE — Discharge Instructions (Signed)
Information on my medicine - ELIQUIS (apixaban)  This medication education was reviewed with me or my healthcare representative as part of my discharge preparation.  The pharmacist that spoke with me during my hospital stay was:   Why was Eliquis prescribed for you? Eliquis was prescribed to treat blood clots that may have been found in the veins of your legs (deep vein thrombosis) or in your lungs (pulmonary embolism) and to reduce the risk of them occurring again.  What do You need to know about Eliquis ? The starting dose is 10 mg (two 5 mg tablets) taken TWICE daily for the FIRST SEVEN (7) DAYS, then on (enter date)  05/23/2020  the dose is reduced to ONE 5 mg tablet taken TWICE daily.  Eliquis may be taken with or without food.   Try to take the dose about the same time in the morning and in the evening. If you have difficulty swallowing the tablet whole please discuss with your pharmacist how to take the medication safely.  Take Eliquis exactly as prescribed and DO NOT stop taking Eliquis without talking to the doctor who prescribed the medication.  Stopping may increase your risk of developing a new blood clot.  Refill your prescription before you run out.  After discharge, you should have regular check-up appointments with your healthcare provider that is prescribing your Eliquis.    What do you do if you miss a dose? If a dose of ELIQUIS is not taken at the scheduled time, take it as soon as possible on the same day and twice-daily administration should be resumed. The dose should not be doubled to make up for a missed dose.  Important Safety Information A possible side effect of Eliquis is bleeding. You should call your healthcare provider right away if you experience any of the following: ? Bleeding from an injury or your nose that does not stop. ? Unusual colored urine (red or dark brown) or unusual colored stools (red or black). ? Unusual bruising for unknown reasons. ? A  serious fall or if you hit your head (even if there is no bleeding).  Some medicines may interact with Eliquis and might increase your risk of bleeding or clotting while on Eliquis. To help avoid this, consult your healthcare provider or pharmacist prior to using any new prescription or non-prescription medications, including herbals, vitamins, non-steroidal anti-inflammatory drugs (NSAIDs) and supplements.  This website has more information on Eliquis (apixaban): http://www.eliquis.com/eliquis/home

## 2020-05-16 NOTE — Progress Notes (Signed)
  Echocardiogram 2D Echocardiogram has been performed.  Adam Wise 05/16/2020, 9:35 AM

## 2020-05-16 NOTE — Discharge Summary (Signed)
Physician Discharge Summary  Adam Wise PYP:[id number] DOB: October 02, 1951 DOA: 05/15/2020  PCP: [name], NP  Admit date: 05/15/2020 Discharge date: 05/16/2020  Admitted From: Home Disposition: Home  Recommendations for Outpatient Follow-up:  1. Follow up with PCP in 1 week with repeat CBC/BMP 2. Outpatient follow-up with oncology/Dr. Julien Nordmann.  Follow-up with cancer center tomorrow. 3. Follow up in ED if symptoms worsen or new appear   Home Health: No Equipment/Devices: None  Discharge Condition: Stable CODE STATUS: Full Diet recommendation: Heart healthy/carb modified  Brief/Interim Summary: 69 y.o.malewithhistory of extensive stage small cell lung cancer status post radiation presently on chemotherapy presented with worsening shortness of breath.  CT angiogram of the chest done as an outpatient as per oncology recommendations showed left-sided pulmonary embolism extending to his left side of the pulmonary artery with no saddle embolus.  He was referred to the ED.  He was started on heparin drip. After discussion with oncology/Dr. Julien Nordmann, he was switched to oral Eliquis.  He will be discharged home on oral Eliquis.  He will follow up with cancer center tomorrow.  Discharge Diagnoses:  Acute left-sided pulmonary embolism -CT angiogram of the chest done as an outpatient as per oncology recommendations showed left-sided pulmonary embolism extending to his left side of the pulmonary artery with no saddle embolus -Treated with heparin drip. -After discussion with oncology/Dr. Julien Nordmann, he was switched to oral Eliquis.  He will be discharged home on oral Eliquis.  He will follow up with cancer center tomorrow. -Required 2 L oxygen via nasal cannula on admission.    Will ambulate him declined need for home oxygen and if needed he will be discharged home on supplemental oxygen.  Extensive stage small cell lung cancer -Currently undergoing chemotherapy and follows up with Dr.  Julien Nordmann.  Outpatient follow-up with Dr. Julien Nordmann.  He has a follow-up appointment at the cancer center tomorrow to resume chemotherapy.  Diabetes mellitus type 2 with hyperglycemia -Continue home regimen.  Hypertension  - Continue home regimen.  Hyperlipidemia -Continue statin  Obesity -Outpatient follow-up  Discharge Instructions  Discharge Instructions    Diet - low sodium heart healthy   Complete by: As directed    Diet Carb Modified   Complete by: As directed    Increase activity slowly   Complete by: As directed      Allergies as of 05/16/2020      Reactions   Bupropion    Racing heart      Medication List    STOP taking these medications   aspirin EC 81 MG tablet     TAKE these medications   acetaminophen 500 MG tablet Commonly known as: TYLENOL Take 1,500 mg by mouth every 8 (eight) hours as needed for moderate pain or headache.   albuterol 108 (90 Base) MCG/ACT inhaler Commonly known as: VENTOLIN HFA Inhale 2 puffs into the lungs every 6 (six) hours as needed for wheezing or shortness of breath.   amLODipine 10 MG tablet Commonly known as: NORVASC TAKE 1 TABLET BY MOUTH EVERY DAY   Apixaban Starter Pack ('10mg'$  and '5mg'$ ) Commonly known as: ELIQUIS STARTER PACK Take as directed on package: start with two-'5mg'$  tablets twice daily for 7 days. On day 8, switch to one-'5mg'$  tablet twice daily.   B-D 3CC LUER-LOK SYR 22GX1" 22G X 1" 3 ML Misc Generic drug: SYRINGE-NEEDLE (DISP) 3 ML USE AS INSTRUCTED FOR TESTOSTERONE INJECTION EVERY 2 WEEKS   blood glucose meter kit and supplies Kit Dispense based on patient and  insurance preference. Use up to four times daily as directed.   [employer] CoQ-10 200 MG capsule Generic drug: Coenzyme Q10 Take 200 mg by mouth every evening.   esomeprazole 20 MG capsule Commonly known as: NEXIUM Take 20 mg by mouth daily.   fluticasone 50 MCG/ACT nasal spray Commonly known as: FLONASE Place 2 sprays into both nostrils daily as  needed for allergies.   gabapentin 300 MG capsule Commonly known as: NEURONTIN Take 300 mg by mouth 2 (two) times daily.   glipiZIDE 10 MG tablet Commonly known as: GLUCOTROL TAKE 1 TABLET (10 MG TOTAL) BY MOUTH 2 (TWO) TIMES DAILY BEFORE A MEAL. FOR DIABETES.   HYDROcodone-homatropine 5-1.5 MG/5ML syrup Commonly known as: HYCODAN Take 5 mLs by mouth at bedtime as needed for cough.   KRILL OIL PO Take 350 mg by mouth daily.   Lancets Misc USE UP TO 3 TIMES DAILY AS DIRECTED   lidocaine-prilocaine cream Commonly known as: EMLA Apply 1 application topically as needed. What changed: reasons to take this   lisinopril 5 MG tablet Commonly known as: ZESTRIL TAKE 1 TABLET BY MOUTH EVERY DAY   Melatonin 10 MG Caps Take 10 mg by mouth at bedtime.   metFORMIN 1000 MG tablet Commonly known as: GLUCOPHAGE TAKE 1 TABLET (1,000 MG TOTAL) BY MOUTH 2 (TWO) TIMES DAILY WITH A MEAL.   Multi-Vitamins Tabs Take 1 tablet by mouth daily.   OneTouch Ultra test strip Generic drug: glucose blood USE UP TO 4 TIMES DAILY AS DIRECTED   oxymetazoline 0.05 % nasal spray Commonly known as: AFRIN Place 1 spray into both nostrils 2 (two) times daily as needed for congestion.   pioglitazone 45 MG tablet Commonly known as: ACTOS TAKE 1 TABLET BY MOUTH EVERY DAY   pravastatin 40 MG tablet Commonly known as: PRAVACHOL TAKE 1 TABLET BY MOUTH EVERY DAY IN THE EVENING FOR CHOLESTEROL What changed: See the new instructions.   prochlorperazine 10 MG tablet Commonly known as: COMPAZINE Take 1 tablet (10 mg total) by mouth every 6 (six) hours as needed for nausea or vomiting.   sildenafil 50 MG tablet Commonly known as: VIAGRA TAKE 1 TABLET BY MOUTH EVERY DAY AS NEEDED What changed:   when to take this  reasons to take this   sucralfate 1 g tablet Commonly known as: Carafate Take 1 tablet (1 g total) by mouth 4 (four) times daily. Dissolve each tablet in 15 cc water before use. What  changed: when to take this   testosterone cypionate 200 MG/ML injection Commonly known as: DEPOTESTOSTERONE CYPIONATE Inject 200 mg into the muscle every 14 (fourteen) days.   TURMERIC COMPLEX/BLACK PEPPER PO Take 1 tablet by mouth in the morning and at bedtime.      Follow-up Information    [name], NP. Schedule an appointment as soon as possible for a visit in 1 week(s).   Specialty: Internal Medicine Contact information: [place] [id number] [phone]        Curt Bears, MD Follow up.   Specialty: Oncology Why: Keep scheduled appointment at cancer center tomorrow Contact information: [address] [place] [address] [phone]          Allergies  Allergen Reactions  . Bupropion     Racing heart    Consultations:  Discussed with Dr. Julien Nordmann   Procedures/Studies: CT ANGIO CHEST PE W OR WO CONTRAST  Addendum Date: 05/15/2020   ADDENDUM REPORT: 05/15/2020 18:15 ADDENDUM: Critical Value/emergent results  were called by telephone at the time of interpretation on 05/15/2020 at 6:10 pm to on-call provider DR. KALE, who verbally acknowledged these results. Patient was instructed to proceed to the [facility] for further evaluation and treatment at this time. Electronically Signed   By: Ilona Sorrel M.D.   On: 05/15/2020 18:15   Result Date: 05/15/2020 CLINICAL DATA:  Stage IV right lung cancer with ongoing chemotherapy. Dyspnea for 3 days. EXAM: CT ANGIOGRAPHY CHEST WITH CONTRAST TECHNIQUE: Multidetector CT imaging of the chest was performed using the standard protocol during bolus administration of intravenous contrast. Multiplanar CT image reconstructions and MIPs were obtained to evaluate the vascular anatomy. CONTRAST:  162m OMNIPAQUE IOHEXOL 350 MG/ML SOLN COMPARISON:  03/28/2020 PET-CT.  03/26/2020 chest CT. FINDINGS: Cardiovascular: The study is high quality for the evaluation of pulmonary embolism. There is acute  left-sided pulmonary embolism involving the distal left pulmonary artery and lobar, segmental and subsegmental left upper and left lower lobe branches. No saddle embolus. Atherosclerotic nonaneurysmal thoracic aorta. Dilated main pulmonary artery (3.8 cm diameter), unchanged. Top-normal heart size. No significant pericardial fluid/thickening. Three-vessel coronary atherosclerosis. Right internal jugular Port-A-Cath terminates at the cavoatrial junction. Mediastinum/Nodes: No discrete thyroid nodules. Unremarkable esophagus. No axillary adenopathy. Enlarged 1.5 cm right pericardiophrenic node (series 5/image 157), previously 1.6 cm, not appreciably changed. Short axis diameter 3.5 cm enlarged right paratracheal node (series 5/image 93), previously 4.6 cm using similar measurement technique, decreased. Enlarged 2.1 cm subcarinal node (series 5/image 116), decreased from 3.7 cm. Previously enlarged 1.4 cm left paratracheal node is decreased to 00.9 cm (series 5/image 96). Lungs/Pleura: No pneumothorax. Small dependent right pleural effusion, stable. No left pleural effusion. Poorly marginated central right upper lobe 6.3 x 3.3 cm lung mass (series 6/image 51), decreased from approximately 10.1 x 6.9 cm using similar measurement technique. Persistent patchy bandlike and nodular foci of consolidation throughout the peripheral right upper lobe, decreased. Interlobular septal thickening throughout the right lung is increased. No new significant pulmonary nodules. Upper abdomen: Numerous (greater than 10) small hypodense liver lesions scattered throughout the visualized liver, decreased since 03/28/2020 PET-CT, for example measuring 1.2 cm in the segment 4A left liver lobe (series 4/image 74), previously 2.3 cm. Exophytic simple 3.4 cm lateral upper right renal cyst. Musculoskeletal: No aggressive appearing focal osseous lesions. Marked thoracic spondylosis. Review of the MIP images confirms the above findings. IMPRESSION:  1. Acute left-sided pulmonary embolism extending proximally to the distal left pulmonary artery. No saddle embolus. 2. Positive interval response to therapy. Decreased liver metastases. Decreased mediastinal lymphadenopathy. Poorly marginated central right upper lobe lung mass is decreased. Decreased nonspecific peripheral right upper lobe patchy bandlike and nodular foci of consolidation compatible with a combination of postobstructive pneumonia, atelectasis and/or tumor. 3. Interlobular septal thickening throughout the right lung is increased, nonspecific, potentially asymmetric pulmonary edema, post treatment change and/or lymphangitic tumor, suggestive fashion on short-term follow-up chest CT. 4. Stable small dependent right pleural effusion. 5. Stable dilated main pulmonary artery, suggesting chronic pulmonary arterial hypertension. 6. Aortic Atherosclerosis (ICD10-I70.0). Electronically Signed: By: JIlona SorrelM.D. On: 05/15/2020 18:07   IR IMAGING GUIDED PORT INSERTION  Result Date: 04/17/2020 INDICATION: Metastatic lung cancer. In need of durable intravenous access for chemotherapy administration. EXAM: IMPLANTED PORT A CATH PLACEMENT WITH ULTRASOUND AND FLUOROSCOPIC GUIDANCE COMPARISON:  PET-CT-03/28/2020; chest CT-03/26/2020 MEDICATIONS: Ancef 2 gm IV; The antibiotic was administered within an appropriate time interval prior to skin puncture. ANESTHESIA/SEDATION: Moderate (conscious) sedation was employed during this procedure. A total of  Versed 2 mg and Fentanyl 100 mcg was administered intravenously. Moderate Sedation Time: 28 minutes. The patient's level of consciousness and vital signs were monitored continuously by radiology nursing throughout the procedure under my direct supervision. CONTRAST:  None FLUOROSCOPY TIME:  36 seconds (14 mGy) COMPLICATIONS: None immediate. PROCEDURE: The procedure, risks, benefits, and alternatives were explained to the patient. Questions regarding the procedure were  encouraged and answered. The patient understands and consents to the procedure. Patient requested placement of a right-sided port a catheter despite known right supraclavicular lymphadenopathy as he sleeps on his left side with his arm above his head. Sonographic evaluation performed of the right neck again demonstrated supraclavicular adenopathy of though also demonstrated an adequate percutaneous window for Port a catheter placement. Given patient's request, the decision was made to proceed with right-sided port a catheter placement. As such the right neck and chest were prepped with chlorhexidine in a sterile fashion, and a sterile drape was applied covering the operative field. Maximum barrier sterile technique with sterile gowns and gloves were used for the procedure. A timeout was performed prior to the initiation of the procedure. Local anesthesia was provided with 1% lidocaine with epinephrine. After creating a small venotomy incision, a micropuncture kit was utilized to access the internal jugular vein. Real-time ultrasound guidance was utilized for vascular access including the acquisition of a permanent ultrasound image documenting patency of the accessed vessel. The microwire was utilized to measure appropriate catheter length. A subcutaneous port pocket was then created along the upper chest wall utilizing a combination of sharp and blunt dissection. The pocket was irrigated with sterile saline. A single lumen "standard sized" power injectable port was chosen for placement. The 8 Fr catheter was tunneled from the port pocket site to the venotomy incision. The port was placed in the pocket. The external catheter was trimmed to appropriate length. At the venotomy, an 8 Fr peel-away sheath was placed over a guidewire under fluoroscopic guidance. The catheter was then placed through the sheath and the sheath was removed. Final catheter positioning was confirmed and documented with a fluoroscopic spot  radiograph. The port was accessed with a Huber needle, aspirated and flushed with heparinized saline. The venotomy site was closed with an interrupted 4-0 Vicryl suture. The port pocket incision was closed with interrupted 2-0 Vicryl suture. The skin was opposed with a running subcuticular 4-0 Vicryl suture. Dermabond and Steri-strips were applied to both incisions. Dressings were applied. The patient tolerated the procedure well without immediate post procedural complication. FINDINGS: After catheter placement, the tip lies within the superior cavoatrial junction. The catheter aspirates and flushes normally and is ready for immediate use. IMPRESSION: Successful placement of a right internal jugular approach power injectable Port-A-Cath. The catheter is ready for immediate use. Again, right internal jugular approach port a catheter placement was performed per patient request after sonographic evaluation demonstrated an adequate percutaneous access to the right internal jugular vein despite known right supraclavicular lymphadenopathy. Electronically Signed   By: Sandi Mariscal M.D.   On: 04/17/2020 14:47    Lower extremity duplex ultrasound and 2D echo report pending   Subjective: Patient seen and examined at bedside.  Feels short of breath with exertion.  Denies any worsening chest pain, fever, nausea or vomiting.  Discharge Exam: Vitals:   05/16/20 0557 05/16/20 0956  BP: (!) 127/93 119/83  Pulse: 95 97  Resp: 18 16  Temp: 98.2 F (36.8 C) 97.9 F (36.6 C)  SpO2: 97% 95%   General exam:  Appears calm and comfortable  Respiratory system: Bilateral decreased breath sounds at bases scattered crackles Cardiovascular system: S1 & S2 heard, Rate controlled Gastrointestinal system: Abdomen is obese, nondistended, soft and nontender. Normal bowel sounds heard. Extremities: No cyanosis, clubbing; trace lower extremity edema  Central nervous system: Alert and oriented. No focal neurological deficits.  Moving extremities Skin: No rashes, lesions or ulcers Psychiatry: Judgement and insight appear normal. Mood & affect appropriate.      The results of significant diagnostics from this hospitalization (including imaging, microbiology, ancillary and laboratory) are listed below for reference.     Microbiology: Recent Results (from the past 240 hour(s))  SARS Coronavirus 2 by RT PCR (hospital order, performed in [facility] hospital lab) Nasopharyngeal Nasopharyngeal Swab     Status: None   Collection Time: 05/15/20  7:57 PM   Specimen: Nasopharyngeal Swab  Result Value Ref Range Status   SARS Coronavirus 2 NEGATIVE NEGATIVE Final    Comment: (NOTE) SARS-CoV-2 target nucleic acids are NOT DETECTED. The SARS-CoV-2 RNA is generally detectable in upper and lower respiratory specimens during the acute phase of infection. The lowest concentration of SARS-CoV-2 viral copies this assay can detect is 250 copies / mL. A negative result does not preclude SARS-CoV-2 infection and should not be used as the sole basis for treatment or other patient management decisions.  A negative result may occur with improper specimen collection / handling, submission of specimen other than nasopharyngeal swab, presence of viral mutation(s) within the areas targeted by this assay, and inadequate number of viral copies (<250 copies / mL). A negative result must be combined with clinical observations, patient history, and epidemiological information. Fact Sheet for Patients:   StrictlyIdeas.no Fact Sheet for Healthcare Providers: BankingDealers.co.za This test is not yet approved or cleared  by the [place] FDA and has been authorized for detection and/or diagnosis of SARS-CoV-2 by FDA under an Emergency Use Authorization (EUA).  This EUA will remain in effect (meaning this test can be used) for the duration of the COVID-19 declaration under Section 564(b)(1)  of the Act, 21 U.S.C. section 360bbb-3(b)(1), unless the authorization is terminated or revoked sooner. Performed at [facility], [place] [address] [place], [place] [address]      Labs: BNP (last 3 results) No results for input(s): BNP in the last 8760 hours. Basic Metabolic Panel: Recent Labs  Lab 05/15/20 1045 05/16/20 0343  NA 139 138  K 4.3 4.4  CL 104 101  CO2 24 27  GLUCOSE 229* 248*  BUN 11 15  CREATININE 1.16 1.22  CALCIUM 8.5* 8.1*   Liver Function Tests: Recent Labs  Lab 05/15/20 1045  AST 13*  ALT 18  ALKPHOS 111  BILITOT 0.3  PROT 6.0*  ALBUMIN 3.1*   No results for input(s): LIPASE, AMYLASE in the last 168 hours. No results for input(s): AMMONIA in the last 168 hours. CBC: Recent Labs  Lab 05/15/20 1045 05/16/20 0343  WBC 5.3 5.9  NEUTROABS 4.0  --   HGB 12.8* 12.1*  HCT 39.7 37.3*  MCV 89.0 91.2  PLT 233 248   Cardiac Enzymes: No results for input(s): CKTOTAL, CKMB, CKMBINDEX, TROPONINI in the last 168 hours. BNP: Invalid input(s): POCBNP CBG: Recent Labs  Lab 05/15/20 2247 05/16/20 0741  GLUCAP 267* 203*   D-Dimer No results for input(s): DDIMER in the last 72 hours. Hgb A1c No results for input(s): HGBA1C in the last 72 hours. Lipid Profile No results for input(s): CHOL, HDL, LDLCALC,  TRIG, CHOLHDL, LDLDIRECT in the last 72 hours. Thyroid function studies Recent Labs    05/15/20 1045  TSH 0.703   Anemia work up No results for input(s): VITAMINB12, FOLATE, FERRITIN, TIBC, IRON, RETICCTPCT in the last 72 hours. Urinalysis    Component Value Date/Time   COLORURINE YELLOW 11/25/2017 [place] 11/25/2017 1359   LABSPEC 1.020 11/25/2017 1359   PHURINE 6.0 11/25/2017 1359   GLUCOSEU 100 (A) 11/25/2017 1359   HGBUR NEGATIVE 11/25/2017 1359   BILIRUBINUR NEGATIVE 11/25/2017 1359   KETONESUR NEGATIVE 11/25/2017 1359   UROBILINOGEN 0.2 11/25/2017 1359   NITRITE NEGATIVE 11/25/2017 1359    LEUKOCYTESUR NEGATIVE 11/25/2017 1359   Sepsis Labs Invalid input(s): PROCALCITONIN,  WBC,  LACTICIDVEN Microbiology Recent Results (from the past 240 hour(s))  SARS Coronavirus 2 by RT PCR (hospital order, performed in [facility] hospital lab) Nasopharyngeal Nasopharyngeal Swab     Status: None   Collection Time: 05/15/20  7:57 PM   Specimen: Nasopharyngeal Swab  Result Value Ref Range Status   SARS Coronavirus 2 NEGATIVE NEGATIVE Final    Comment: (NOTE) SARS-CoV-2 target nucleic acids are NOT DETECTED. The SARS-CoV-2 RNA is generally detectable in upper and lower respiratory specimens during the acute phase of infection. The lowest concentration of SARS-CoV-2 viral copies this assay can detect is 250 copies / mL. A negative result does not preclude SARS-CoV-2 infection and should not be used as the sole basis for treatment or other patient management decisions.  A negative result may occur with improper specimen collection / handling, submission of specimen other than nasopharyngeal swab, presence of viral mutation(s) within the areas targeted by this assay, and inadequate number of viral copies (<250 copies / mL). A negative result must be combined with clinical observations, patient history, and epidemiological information. Fact Sheet for Patients:   StrictlyIdeas.no Fact Sheet for Healthcare Providers: BankingDealers.co.za This test is not yet approved or cleared  by the [place] FDA and has been authorized for detection and/or diagnosis of SARS-CoV-2 by FDA under an Emergency Use Authorization (EUA).  This EUA will remain in effect (meaning this test can be used) for the duration of the COVID-19 declaration under Section 564(b)(1) of the Act, 21 U.S.C. section 360bbb-3(b)(1), unless the authorization is terminated or revoked sooner. Performed at [facility], [place] [address] [place], [place] [address]       Time coordinating discharge: 35 minutes  SIGNED:   [name] August, MD  [facility] 05/16/2020, 12:58 PM

## 2020-05-16 NOTE — Progress Notes (Signed)
ANTICOAGULATION CONSULT NOTE - Follow Up Consult  Pharmacy Consult for Heparin Indication: pulmonary embolus  Allergies  Allergen Reactions  . Bupropion     Racing heart    Patient Measurements: Height: 5\' 9"  (175.3 cm) Weight: 118.4 kg (261 lb 0.4 oz) IBW/kg (Calculated) : 70.7 Heparin Dosing Weight:   Vital Signs: Temp: 98.1 F (36.7 C) (06/02 0202) Temp Source: Oral (06/02 0202) BP: 121/77 (06/02 0202) Pulse Rate: 84 (06/02 0202)  Labs: Recent Labs    05/15/20 1045 05/15/20 1957 05/15/20 2231 05/16/20 0010 05/16/20 0343  HGB 12.8*  --   --   --  12.1*  HCT 39.7  --   --   --  37.3*  PLT 233  --   --   --  248  APTT  --  24  --   --   --   LABPROT  --  12.4  --   --   --   INR  --  1.0  --   --   --   HEPARINUNFRC  --   --   --   --  0.37  CREATININE 1.16  --   --   --  1.22  TROPONINIHS  --   --  8 7  --     Estimated Creatinine Clearance: 72.6 mL/min (by C-G formula based on SCr of 1.22 mg/dL).   Medications:  Infusions:  . heparin 1,650 Units/hr (05/15/20 2108)    Assessment: Patient with heparin level at goal.  No heparin issues noted.  Goal of Therapy:  Heparin level 0.3-0.7 units/ml Monitor platelets by anticoagulation protocol: Yes   Plan:  Continue heparin drip at current rate Recheck level at [address] [place] 05/16/2020,4:54 AM

## 2020-05-16 NOTE — Care Management Obs Status (Signed)
[place] NOTIFICATION   Patient Details  Name: Bennie Chirico MRN: [id number] Date of Birth: 02-18-1951   Medicare Observation Status Notification Given:  Yes    MahabirJuliann Pulse, RN 05/16/2020, 1:35 PM

## 2020-05-16 NOTE — Progress Notes (Signed)
Patient ID: Adam Wise, male   DOB: 08/19/1951, 68 y.o.   MRN: [id number]  PROGRESS NOTE    Adam Wise  NWG:[id number] DOB: 1951/08/06 DOA: 05/15/2020 PCP: [name], NP   Brief Narrative:  69 y.o. male with history of extensive stage small cell lung cancer status post radiation presently on chemotherapy presented with worsening shortness of breath.  CT angiogram of the chest done as an outpatient as per oncology recommendations showed left-sided pulmonary embolism extending to his left side of the pulmonary artery with no saddle embolus.  He was referred to the ED.  He was started on heparin drip.  Assessment & Plan:   Acute left-sided pulmonary embolism -CT angiogram of the chest done as an outpatient as per oncology recommendations showed left-sided pulmonary embolism extending to his left side of the pulmonary artery with no saddle embolus -Currently on heparin drip.  Follow 2D echo and bilateral lower extremity duplex ultrasound. -We will discuss with oncology/Dr. Julien Nordmann regarding choice of anticoagulation: Lovenox versus Eliquis or Xarelto. -Required 2 L oxygen via nasal cannula on admission.  Wean off as able.  Extensive stage small cell lung cancer -Currently undergoing chemotherapy and follows up with Dr. Julien Nordmann.  Outpatient follow-up with Dr. Julien Nordmann.  Diabetes mellitus type 2 with hyperglycemia -Continue CBGs with SSI  Hypertension  -Monitor blood pressure.  Continue amlodipine and lisinopril  Hyperlipidemia -Continue statin  Obesity -Outpatient follow-up  DVT prophylaxis: Heparin Code Status: Full Family Communication: Spoke to patient at bedside Disposition Plan: Status is: Inpatient  Remains inpatient appropriate because he is currently on heparin drip and required supplemental oxygen on admission.  Will probably need heparin drip for another 24 hours before switching to Lovenox versus oral DOAC.  Dispo: The patient is from: Home               Anticipated d/c is to: Home              Anticipated d/c date is: 1 day              Patient currently is not medically stable to d/c.  Consultants: will discuss with oncology regarding anticoagulation plan  Procedures: None  Antimicrobials: None   Subjective: Patient seen and examined at bedside.  Feels short of breath with exertion.  Denies any worsening chest pain, fever, nausea or vomiting.  Objective: Vitals:   05/15/20 2351 05/16/20 0202 05/16/20 0557 05/16/20 0956  BP:  121/77 (!) 127/93 119/83  Pulse: 95 84 95 97  Resp: 20 20 18 16   Temp:  98.1 F (36.7 C) 98.2 F (36.8 C) 97.9 F (36.6 C)  TempSrc:  Oral Oral Oral  SpO2: 96% 98% 97% 95%  Weight:      Height:        Intake/Output Summary (Last 24 hours) at 05/16/2020 1018 Last data filed at 05/16/2020 0901 Gross per 24 hour  Intake 754.01 ml  Output 850 ml  Net -95.99 ml   Filed Weights   05/15/20 2224  Weight: 118.4 kg    Examination:  General exam: Appears calm and comfortable  Respiratory system: Bilateral decreased breath sounds at bases scattered crackles Cardiovascular system: S1 & S2 heard, Rate controlled Gastrointestinal system: Abdomen is obese, nondistended, soft and nontender. Normal bowel sounds heard. Extremities: No cyanosis, clubbing; trace lower extremity edema  Central nervous system: Alert and oriented. No focal neurological deficits. Moving extremities Skin: No rashes, lesions or ulcers Psychiatry: Judgement and insight appear normal. Mood & affect appropriate.  Data Reviewed: I have personally reviewed following labs and imaging studies  CBC: Recent Labs  Lab 05/15/20 1045 05/16/20 0343  WBC 5.3 5.9  NEUTROABS 4.0  --   HGB 12.8* 12.1*  HCT 39.7 37.3*  MCV 89.0 91.2  PLT 233 481   Basic Metabolic Panel: Recent Labs  Lab 05/15/20 1045 05/16/20 0343  NA 139 138  K 4.3 4.4  CL 104 101  CO2 24 27  GLUCOSE 229* 248*  BUN 11 15  CREATININE 1.16 1.22  CALCIUM 8.5*  8.1*   GFR: Estimated Creatinine Clearance: 72.6 mL/min (by C-G formula based on SCr of 1.22 mg/dL). Liver Function Tests: Recent Labs  Lab 05/15/20 1045  AST 13*  ALT 18  ALKPHOS 111  BILITOT 0.3  PROT 6.0*  ALBUMIN 3.1*   No results for input(s): LIPASE, AMYLASE in the last 168 hours. No results for input(s): AMMONIA in the last 168 hours. Coagulation Profile: Recent Labs  Lab 05/15/20 1957  INR 1.0   Cardiac Enzymes: No results for input(s): CKTOTAL, CKMB, CKMBINDEX, TROPONINI in the last 168 hours. BNP (last 3 results) Recent Labs    03/26/20 0936  PROBNP 41.0   HbA1C: No results for input(s): HGBA1C in the last 72 hours. CBG: Recent Labs  Lab 05/15/20 2247  GLUCAP 267*   Lipid Profile: No results for input(s): CHOL, HDL, LDLCALC, TRIG, CHOLHDL, LDLDIRECT in the last 72 hours. Thyroid Function Tests: Recent Labs    05/15/20 1045  TSH 0.703   Anemia Panel: No results for input(s): VITAMINB12, FOLATE, FERRITIN, TIBC, IRON, RETICCTPCT in the last 72 hours. Sepsis Labs: No results for input(s): PROCALCITON, LATICACIDVEN in the last 168 hours.  Recent Results (from the past 240 hour(s))  SARS Coronavirus 2 by RT PCR (hospital order, performed in [facility] hospital lab) Nasopharyngeal Nasopharyngeal Swab     Status: None   Collection Time: 05/15/20  7:57 PM   Specimen: Nasopharyngeal Swab  Result Value Ref Range Status   SARS Coronavirus 2 NEGATIVE NEGATIVE Final    Comment: (NOTE) SARS-CoV-2 target nucleic acids are NOT DETECTED. The SARS-CoV-2 RNA is generally detectable in upper and lower respiratory specimens during the acute phase of infection. The lowest concentration of SARS-CoV-2 viral copies this assay can detect is 250 copies / mL. A negative result does not preclude SARS-CoV-2 infection and should not be used as the sole basis for treatment or other patient management decisions.  A negative result may occur with improper specimen collection  / handling, submission of specimen other than nasopharyngeal swab, presence of viral mutation(s) within the areas targeted by this assay, and inadequate number of viral copies (<250 copies / mL). A negative result must be combined with clinical observations, patient history, and epidemiological information. Fact Sheet for Patients:   StrictlyIdeas.no Fact Sheet for Healthcare Providers: BankingDealers.co.za This test is not yet approved or cleared  by the [place] FDA and has been authorized for detection and/or diagnosis of SARS-CoV-2 by FDA under an Emergency Use Authorization (EUA).  This EUA will remain in effect (meaning this test can be used) for the duration of the COVID-19 declaration under Section 564(b)(1) of the Act, 21 U.S.C. section 360bbb-3(b)(1), unless the authorization is terminated or revoked sooner. Performed at [facility], [place] [address], [place], [place] [address]          Radiology Studies: CT ANGIO CHEST PE W OR WO CONTRAST  Addendum Date: 05/15/2020   ADDENDUM REPORT: 05/15/2020 18:15 ADDENDUM: Critical  Value/emergent results were called by telephone at the time of interpretation on 05/15/2020 at 6:10 pm to on-call provider DR. KALE, who verbally acknowledged these results. Patient was instructed to proceed to the [place] for further evaluation and treatment at this time. Electronically Signed   By: Ilona Sorrel M.D.   On: 05/15/2020 18:15   Result Date: 05/15/2020 CLINICAL DATA:  Stage IV right lung cancer with ongoing chemotherapy. Dyspnea for 3 days. EXAM: CT ANGIOGRAPHY CHEST WITH CONTRAST TECHNIQUE: Multidetector CT imaging of the chest was performed using the standard protocol during bolus administration of intravenous contrast. Multiplanar CT image reconstructions and MIPs were obtained to evaluate the vascular anatomy. CONTRAST:  15mL OMNIPAQUE IOHEXOL 350 MG/ML SOLN COMPARISON:   03/28/2020 PET-CT.  03/26/2020 chest CT. FINDINGS: Cardiovascular: The study is high quality for the evaluation of pulmonary embolism. There is acute left-sided pulmonary embolism involving the distal left pulmonary artery and lobar, segmental and subsegmental left upper and left lower lobe branches. No saddle embolus. Atherosclerotic nonaneurysmal thoracic aorta. Dilated main pulmonary artery (3.8 cm diameter), unchanged. Top-normal heart size. No significant pericardial fluid/thickening. Three-vessel coronary atherosclerosis. Right internal jugular Port-A-Cath terminates at the cavoatrial junction. Mediastinum/Nodes: No discrete thyroid nodules. Unremarkable esophagus. No axillary adenopathy. Enlarged 1.5 cm right pericardiophrenic node (series 5/image 157), previously 1.6 cm, not appreciably changed. Short axis diameter 3.5 cm enlarged right paratracheal node (series 5/image 93), previously 4.6 cm using similar measurement technique, decreased. Enlarged 2.1 cm subcarinal node (series 5/image 116), decreased from 3.7 cm. Previously enlarged 1.4 cm left paratracheal node is decreased to 00.9 cm (series 5/image 96). Lungs/Pleura: No pneumothorax. Small dependent right pleural effusion, stable. No left pleural effusion. Poorly marginated central right upper lobe 6.3 x 3.3 cm lung mass (series 6/image 51), decreased from approximately 10.1 x 6.9 cm using similar measurement technique. Persistent patchy bandlike and nodular foci of consolidation throughout the peripheral right upper lobe, decreased. Interlobular septal thickening throughout the right lung is increased. No new significant pulmonary nodules. Upper abdomen: Numerous (greater than 10) small hypodense liver lesions scattered throughout the visualized liver, decreased since 03/28/2020 PET-CT, for example measuring 1.2 cm in the segment 4A left liver lobe (series 4/image 74), previously 2.3 cm. Exophytic simple 3.4 cm lateral upper right renal cyst.  Musculoskeletal: No aggressive appearing focal osseous lesions. Marked thoracic spondylosis. Review of the MIP images confirms the above findings. IMPRESSION: 1. Acute left-sided pulmonary embolism extending proximally to the distal left pulmonary artery. No saddle embolus. 2. Positive interval response to therapy. Decreased liver metastases. Decreased mediastinal lymphadenopathy. Poorly marginated central right upper lobe lung mass is decreased. Decreased nonspecific peripheral right upper lobe patchy bandlike and nodular foci of consolidation compatible with a combination of postobstructive pneumonia, atelectasis and/or tumor. 3. Interlobular septal thickening throughout the right lung is increased, nonspecific, potentially asymmetric pulmonary edema, post treatment change and/or lymphangitic tumor, suggestive fashion on short-term follow-up chest CT. 4. Stable small dependent right pleural effusion. 5. Stable dilated main pulmonary artery, suggesting chronic pulmonary arterial hypertension. 6. Aortic Atherosclerosis (ICD10-I70.0). Electronically Signed: By: Ilona Sorrel M.D. On: 05/15/2020 18:07        Scheduled Meds:  amLODipine  10 mg Oral Daily   aspirin EC  81 mg Oral Daily   Chlorhexidine Gluconate Cloth  6 each Topical Daily   gabapentin  300 mg Oral BID   insulin aspart  0-9 Units Subcutaneous TID WC   lisinopril  5 mg Oral Daily   melatonin  10 mg Oral  QHS   pantoprazole  40 mg Oral Daily   pravastatin  40 mg Oral Daily   sucralfate  1 g Oral TID WC & HS   Continuous Infusions:  heparin 1,650 Units/hr (05/16/20 0542)          [name] August, MD [facility] 05/16/2020, 10:18 AM

## 2020-05-16 NOTE — TOC Transition Note (Signed)
Transition of Care [facility]) - CM/SW Discharge Note   Patient Details  Name: Adam Wise MRN: [id number] Date of Birth: June 15, 1951  [facility] of [facility]) CM/SW Contact:  Dessa Phi, RN Phone Number: 05/16/2020, 2:20 PM   Clinical Narrative: Patient informed of co pay-see prior note for benefit. Qualifies for home 02-with 0rder-MD notfied-[facility] will deliver home 02 travel tank to rm prior d/c. No further CM needs.      Final next level of care: Home/Self Care Barriers to Discharge: No Barriers Identified   Patient Goals and CMS Choice Patient states their goals for this hospitalization and ongoing recovery are:: go home CMS Medicare.gov Compare Post Acute Care list provided to:: Patient Choice offered to / list presented to : Patient  Discharge Placement                       Discharge Plan and Services   Discharge Planning Services: CM Consult Post Acute Care Choice: Durable Medical Equipment(02)          DME Arranged: Oxygen DME Agency: [facility] Date DME Agency Contacted: 05/16/20 Time DME Agency Contacted: [phone] Representative spoke with at DME Agency: [place] ([place]) Interventions     Readmission Risk Interventions No flowsheet data found.

## 2020-05-16 NOTE — Progress Notes (Addendum)
[place] for IV heparin transitioned to Apixaban Indication: pulmonary embolus  Allergies  Allergen Reactions  . Bupropion     Racing heart   Patient Measurements: Height: 5\' 9"  (175.3 cm) Weight: 118.4 kg (261 lb 0.4 oz) IBW/kg (Calculated) : 70.7 Heparin Dosing Weight: 98 kg  Vital Signs: Temp: 97.9 F (36.6 C) (06/02 0956) Temp Source: Oral (06/02 0956) BP: 119/83 (06/02 0956) Pulse Rate: 97 (06/02 0956)  Labs: Recent Labs    05/15/20 1045 05/15/20 1957 05/15/20 2231 05/16/20 0010 05/16/20 0343 05/16/20 1035  HGB 12.8*  --   --   --  12.1*  --   HCT 39.7  --   --   --  37.3*  --   PLT 233  --   --   --  248  --   APTT  --  24  --   --   --   --   LABPROT  --  12.4  --   --   --   --   INR  --  1.0  --   --   --   --   HEPARINUNFRC  --   --   --   --  0.37 0.34  CREATININE 1.16  --   --   --  1.22  --   TROPONINIHS  --   --  8 7  --   --     Estimated Creatinine Clearance: 72.6 mL/min (by C-G formula based on SCr of 1.22 mg/dL).   Medical History: Past Medical History:  Diagnosis Date  . Chickenpox   . Chronic knee pain   . Chronic low back pain   . Essential hypertension   . GERD (gastroesophageal reflux disease)   . OSA (obstructive sleep apnea)   . Testosterone deficiency   . Type 2 diabetes mellitus (HCC)    Medications:  Scheduled:  Infusions:   Assessment: 69 yo male at [facility] today getting C2 of carbo/etoposide with Imfinzi for SCLC and developed shortness of breath. Had CT done and found new PE. To start IV heparin per Rx. On no anticoagulation prior to admission. Baseline CBC and SCr already done.  Begin Heparin with 3000 unit load, 1650 units/hr  Goal of Therapy:  Heparin level 0.3-0.7 units/ml Monitor platelets by anticoagulation protocol: Yes   Today, 05/16/2020 0400 Hep level 0.37 units/ml, 1035 Hep level 0.34 units/ml   Plan: see addendum below Increase Heparin infusion to 1800  units/hr Recheck level at 8p Daily CBC Plan transition to DOAC/Lovenox/other 6/3, attending to discuss with Oncology.  Minda Ditto PharmD 05/16/2020,11:24 AM   Addendum 12:14 Transition to Apixaban Discontinue Heparin when first Apixaban dose given Apixaban 10mg  bid x 7 days > 5mg  bid Will counsel  Minda Ditto PharmD 05/16/2020, 12:16 PM

## 2020-05-16 NOTE — Care Management CC44 (Signed)
Condition Code 44 Documentation Completed  Patient Details  Name: Adam Wise MRN: [id number] Date of Birth: 05-30-1951   Condition Code 44 given:  Yes Patient signature on Condition Code 44 notice:  Yes Documentation of 2 MD's agreement:  Yes Code 44 added to claim:  Yes    Dessa Phi, RN 05/16/2020, 1:36 PM

## 2020-05-16 NOTE — Progress Notes (Signed)
Patient arrived as new admit from ER at around 2140H via stretcher. Patient denies denies any chest pain, N&V, headache or  Dizziness. Patient make his needs known. On 3L/[place], saturating at 94-97%. Oriented to Unit Safety protocol, call bell and personal possession within reach. Bed in the lowest position. Patient verbalized understanding, no further questions as of this time.

## 2020-05-16 NOTE — TOC Benefit Eligibility Note (Signed)
Transition of Care [facility]) Benefit Eligibility Note    Patient Details  Name: Adam Wise MRN: [id number] Date of Birth: 10/24/1951   Medication/Dose: Alveda Reasons   5 MG BID  Covered?: Yes     Prescription Coverage [facility]: [facility]  Spoke with Person/Company/Phone Number:: ELISE  @ [facility]  # [phone]  Co-Pay: $75.00  Prior Approval: No  Deductible: (NO DEDUCTIBLE WITH PLAN)  Additional Notes: ELIQUIS  5 MG BID  COVER- YES  CO-PAY- $75.00 TIER- NO  P/A-NO    Memory Argue Phone Number: 05/16/2020, 2:06 PM

## 2020-05-17 ENCOUNTER — Ambulatory Visit: Payer: Medicare Other

## 2020-05-17 ENCOUNTER — Inpatient Hospital Stay: Payer: Medicare Other

## 2020-05-17 DIAGNOSIS — R0602 Shortness of breath: Secondary | ICD-10-CM | POA: Diagnosis not present

## 2020-05-17 DIAGNOSIS — C3411 Malignant neoplasm of upper lobe, right bronchus or lung: Secondary | ICD-10-CM | POA: Diagnosis not present

## 2020-05-17 LAB — BASIC METABOLIC PANEL
Anion gap: 5 (ref 5–15)
BUN: 21 mg/dL (ref 8–23)
CO2: 27 mmol/L (ref 22–32)
Calcium: 8.1 mg/dL — ABNORMAL LOW (ref 8.9–10.3)
Chloride: 98 mmol/L (ref 98–111)
Creatinine, Ser: 1.13 mg/dL (ref 0.61–1.24)
GFR calc Af Amer: >60 mL/min (ref >60)
GFR calc non Af Amer: >60 mL/min (ref >60)
Glucose, Bld: 188 mg/dL — ABNORMAL HIGH (ref 70–99)
Potassium: 3.9 mmol/L (ref 3.5–5.1)
Sodium: 130 mmol/L — ABNORMAL LOW (ref 135–145)

## 2020-05-17 LAB — CBC
HCT: 35.7 % — ABNORMAL LOW (ref 39.0–52.0)
Hemoglobin: 11.6 g/dL — ABNORMAL LOW (ref 13.0–17.0)
MCH: 30 pg (ref 26.0–34.0)
MCHC: 32.5 g/dL (ref 30.0–36.0)
MCV: 92.2 fL (ref 80.0–100.0)
Platelets: 214 10*3/uL (ref 150–400)
RBC: 3.87 MIL/uL — ABNORMAL LOW (ref 4.22–5.81)
RDW: 15.2 % (ref 11.5–15.5)
WBC: 4.4 10*3/uL (ref 4.0–10.5)
nRBC: 0 % (ref 0.0–0.2)

## 2020-05-17 LAB — MAGNESIUM: Magnesium: 1.9 mg/dL (ref 1.7–2.4)

## 2020-05-17 LAB — GLUCOSE, CAPILLARY: Glucose-Capillary: 143 mg/dL — ABNORMAL HIGH (ref 70–99)

## 2020-05-17 MED ORDER — HEPARIN SOD (PORK) LOCK FLUSH 100 UNIT/ML IV SOLN
500.0000 [IU] | INTRAVENOUS | Status: AC | PRN
  Administered 2020-05-17: 500 [IU]
  Filled 2020-05-17: qty 5

## 2020-05-17 MED FILL — Dexamethasone Sodium Phosphate Inj 100 MG/10ML: INTRAMUSCULAR | Qty: 1 | Status: AC

## 2020-05-17 NOTE — Progress Notes (Signed)
HEMATOLOGY-ONCOLOGY PROGRESS NOTE  SUBJECTIVE: [name] reports that his breathing is improved today.  Still reports shortness of breath with exertion at times.  He has no chest pain.  No bleeding reported.  Oncology History  Malignant neoplasm of upper lobe of right lung ([place])  04/04/2020 Initial Diagnosis   Malignant neoplasm of upper lobe of right lung ([place])   04/23/2020 -  Chemotherapy   The patient had palonosetron (ALOXI) injection 0.25 mg, 0.25 mg, Intravenous,  Once, 2 of 4 cycles Administration: 0.25 mg (04/23/2020), 0.25 mg (05/15/2020) Trilaciclib Dihydrochloride SOLR 580 mg, 580 mg (100 % of original dose 580 mg), Intravenous,  Once, 1 of 1 cycle Dose modification: 580 mg (original dose 580 mg, Cycle 1) CARBOplatin (PARAPLATIN) 600 mg in sodium chloride 0.9 % 250 mL chemo infusion, 520 mg (100 % of original dose 521.5 mg), Intravenous,  Once, 2 of 4 cycles Dose modification: 521.5 mg (original dose 521.5 mg, Cycle 1), 627.5 mg (original dose 627.5 mg, Cycle 2) Administration: 600 mg (04/23/2020), 630 mg (05/15/2020) etoposide (VEPESID) 240 mg in sodium chloride 0.9 % 1,000 mL chemo infusion, 100 mg/m2 = 240 mg, Intravenous,  Once, 2 of 4 cycles Administration: 240 mg (04/23/2020), 240 mg (04/24/2020), 240 mg (04/25/2020), 240 mg (05/15/2020) fosaprepitant (EMEND) 150 mg in sodium chloride 0.9 % 145 mL IVPB, 150 mg, Intravenous,  Once, 2 of 4 cycles Administration: 150 mg (04/23/2020), 150 mg (05/15/2020) Trilaciclib Dihydrochloride 580 mg in sodium chloride 0.9 % 250 mL chemo infusion, , Intravenous, Once, 1 of 1 cycle Administration: 580 mg (04/24/2020),  (04/25/2020) durvalumab (IMFINZI) 1,500 mg in sodium chloride 0.9 % 100 mL chemo infusion, 1,500 mg, Intravenous,  Once, 2 of 8 cycles Administration: 1,500 mg (04/23/2020), 1,500 mg (05/15/2020) trilaciclib dihydrochloride (COSELA) 600 mg in dextrose 5 % 250 mL (2.069 mg/mL) infusion, 240 mg/m2 = 600 mg, Intravenous,  Once, 1 of 3  cycles Administration: 600 mg (05/15/2020)  for chemotherapy treatment.       REVIEW OF SYSTEMS:   Constitutional: Denies fevers, chills or abnormal weight loss Respiratory: Has dyspnea on exertion intermittently Cardiovascular: Denies palpitation, chest discomfort Gastrointestinal:  Denies nausea, heartburn or change in bowel habits Skin: Denies abnormal skin rashes Lymphatics: Denies new lymphadenopathy or easy bruising Neurological:Denies numbness, tingling or new weaknesses Behavioral/Psych: Mood is stable, no new changes  Extremities: No lower extremity edema All other systems were reviewed with the patient and are negative.  I have reviewed the past medical history, past surgical history, social history and family history with the patient and they are unchanged from previous note.   PHYSICAL EXAMINATION: ECOG PERFORMANCE STATUS: 1 - Symptomatic but completely ambulatory  Vitals:   05/16/20 2204 05/17/20 0629  BP:  136/88  Pulse: 91 72  Resp: 20 20  Temp:  97.6 F (36.4 C)  SpO2: 96% 93%   Filed Weights   05/15/20 2224  Weight: 118.4 kg    Intake/Output from previous day: 06/02 0701 - 06/03 0700 In: 79 [P.O.:680] Out: 350 [Urine:350]  GENERAL:alert, no distress and comfortable LUNGS: Diminished at the bases HEART: regular rate & rhythm and no murmurs and no lower extremity edema ABDOMEN:abdomen soft, non-tender and normal bowel sounds Musculoskeletal:no cyanosis of digits and no clubbing  NEURO: alert & oriented x 3 with fluent speech, no focal motor/sensory deficits  LABORATORY DATA:  I have reviewed the data as listed CMP Latest Ref Rng & Units 05/17/2020 05/16/2020 05/15/2020  Glucose 70 - 99 mg/dL 188(H) 248(H) 229(H)  BUN  8 - 23 mg/dL '21 15 11  '$ Creatinine 0.61 - 1.24 mg/dL 1.13 1.22 1.16  Sodium 135 - 145 mmol/L 130(L) 138 139  Potassium 3.5 - 5.1 mmol/L 3.9 4.4 4.3  Chloride 98 - 111 mmol/L 98 101 104  CO2 22 - 32 mmol/L '27 27 24  '$ Calcium 8.9 - 10.3  mg/dL 8.1(L) 8.1(L) 8.5(L)  Total Protein 6.5 - 8.1 g/dL - - 6.0(L)  Total Bilirubin 0.3 - 1.2 mg/dL - - 0.3  Alkaline Phos 38 - 126 U/L - - 111  AST 15 - 41 U/L - - 13(L)  ALT 0 - 44 U/L - - 18    Lab Results  Component Value Date   WBC 4.4 05/17/2020   HGB 11.6 (L) 05/17/2020   HCT 35.7 (L) 05/17/2020   MCV 92.2 05/17/2020   PLT 214 05/17/2020   NEUTROABS 4.0 05/15/2020    CT ANGIO CHEST PE W OR WO CONTRAST  Addendum Date: 05/15/2020   ADDENDUM REPORT: 05/15/2020 18:15 ADDENDUM: Critical Value/emergent results were called by telephone at the time of interpretation on 05/15/2020 at 6:10 pm to on-call provider DR. KALE, who verbally acknowledged these results. Patient was instructed to proceed to the [facility] for further evaluation and treatment at this time. Electronically Signed   By: Ilona Sorrel M.D.   On: 05/15/2020 18:15   Result Date: 05/15/2020 CLINICAL DATA:  Stage IV right lung cancer with ongoing chemotherapy. Dyspnea for 3 days. EXAM: CT ANGIOGRAPHY CHEST WITH CONTRAST TECHNIQUE: Multidetector CT imaging of the chest was performed using the standard protocol during bolus administration of intravenous contrast. Multiplanar CT image reconstructions and MIPs were obtained to evaluate the vascular anatomy. CONTRAST:  165m OMNIPAQUE IOHEXOL 350 MG/ML SOLN COMPARISON:  03/28/2020 PET-CT.  03/26/2020 chest CT. FINDINGS: Cardiovascular: The study is high quality for the evaluation of pulmonary embolism. There is acute left-sided pulmonary embolism involving the distal left pulmonary artery and lobar, segmental and subsegmental left upper and left lower lobe branches. No saddle embolus. Atherosclerotic nonaneurysmal thoracic aorta. Dilated main pulmonary artery (3.8 cm diameter), unchanged. Top-normal heart size. No significant pericardial fluid/thickening. Three-vessel coronary atherosclerosis. Right internal jugular Port-A-Cath terminates at the cavoatrial junction. Mediastinum/Nodes:  No discrete thyroid nodules. Unremarkable esophagus. No axillary adenopathy. Enlarged 1.5 cm right pericardiophrenic node (series 5/image 157), previously 1.6 cm, not appreciably changed. Short axis diameter 3.5 cm enlarged right paratracheal node (series 5/image 93), previously 4.6 cm using similar measurement technique, decreased. Enlarged 2.1 cm subcarinal node (series 5/image 116), decreased from 3.7 cm. Previously enlarged 1.4 cm left paratracheal node is decreased to 00.9 cm (series 5/image 96). Lungs/Pleura: No pneumothorax. Small dependent right pleural effusion, stable. No left pleural effusion. Poorly marginated central right upper lobe 6.3 x 3.3 cm lung mass (series 6/image 51), decreased from approximately 10.1 x 6.9 cm using similar measurement technique. Persistent patchy bandlike and nodular foci of consolidation throughout the peripheral right upper lobe, decreased. Interlobular septal thickening throughout the right lung is increased. No new significant pulmonary nodules. Upper abdomen: Numerous (greater than 10) small hypodense liver lesions scattered throughout the visualized liver, decreased since 03/28/2020 PET-CT, for example measuring 1.2 cm in the segment 4A left liver lobe (series 4/image 74), previously 2.3 cm. Exophytic simple 3.4 cm lateral upper right renal cyst. Musculoskeletal: No aggressive appearing focal osseous lesions. Marked thoracic spondylosis. Review of the MIP images confirms the above findings. IMPRESSION: 1. Acute left-sided pulmonary embolism extending proximally to the distal left pulmonary artery. No saddle embolus.  2. Positive interval response to therapy. Decreased liver metastases. Decreased mediastinal lymphadenopathy. Poorly marginated central right upper lobe lung mass is decreased. Decreased nonspecific peripheral right upper lobe patchy bandlike and nodular foci of consolidation compatible with a combination of postobstructive pneumonia, atelectasis and/or tumor.  3. Interlobular septal thickening throughout the right lung is increased, nonspecific, potentially asymmetric pulmonary edema, post treatment change and/or lymphangitic tumor, suggestive fashion on short-term follow-up chest CT. 4. Stable small dependent right pleural effusion. 5. Stable dilated main pulmonary artery, suggesting chronic pulmonary arterial hypertension. 6. Aortic Atherosclerosis (ICD10-I70.0). Electronically Signed: By: Ilona Sorrel M.D. On: 05/15/2020 18:07   ECHOCARDIOGRAM COMPLETE  Result Date: 05/16/2020    ECHOCARDIOGRAM REPORT   Patient Name:   [facility] Date of Exam: 05/16/2020 Medical Rec #:  [id number]    Height:       69.0 in Accession #:    [id number]   Weight:       261.0 lb Date of Birth:  11-19-1951    BSA:          2.314 m Patient Age:    59 years     BP:           127/93 mmHg Patient Gender: M            HR:           96 bpm. Exam Location:  Inpatient Procedure: 2D Echo, Cardiac Doppler and Color Doppler Indications:    Pulmonary Embolus 415.19 / I26.99  History:        Patient has no prior history of Echocardiogram examinations.                 Signs/Symptoms:Shortness of Breath; Risk Factors:Hypertension,                 Diabetes, Dyslipidemia and Former Smoker. Pulmonary embolism.  Sonographer:    Vickie Epley RDCS Referring Phys: [place]  1. Left ventricular ejection fraction, by estimation, is 55 to 60%. The left ventricle has normal function. The left ventricle has no regional wall motion abnormalities. There is mild left ventricular hypertrophy. Indeterminate diastolic filling due to E-A fusion.  2. Right ventricular systolic function is normal. The right ventricular size is normal. Tricuspid regurgitation signal is inadequate for assessing PA pressure.  3. The mitral valve is normal in structure. No evidence of mitral valve regurgitation. No evidence of mitral stenosis.  4. The aortic valve is tricuspid. Aortic valve regurgitation is not  visualized. No aortic stenosis is present.  5. The inferior vena cava is dilated in size with >50% respiratory variability, suggesting right atrial pressure of 8 mmHg. FINDINGS  Left Ventricle: Left ventricular ejection fraction, by estimation, is 55 to 60%. The left ventricle has normal function. The left ventricle has no regional wall motion abnormalities. The left ventricular internal cavity size was normal in size. There is  mild left ventricular hypertrophy. Indeterminate diastolic filling due to E-A fusion. Right Ventricle: The right ventricular size is normal. No increase in right ventricular wall thickness. Right ventricular systolic function is normal. Tricuspid regurgitation signal is inadequate for assessing PA pressure. Left Atrium: Left atrial size was normal in size. Right Atrium: Right atrial size was normal in size. Pericardium: There is no evidence of pericardial effusion. Mitral Valve: The mitral valve is normal in structure. Mild mitral annular calcification. No evidence of mitral valve regurgitation. No evidence of mitral valve stenosis. Tricuspid Valve: The tricuspid valve is normal in structure. Tricuspid  valve regurgitation is not demonstrated. Aortic Valve: The aortic valve is tricuspid. Aortic valve regurgitation is not visualized. No aortic stenosis is present. Pulmonic Valve: The pulmonic valve was normal in structure. Pulmonic valve regurgitation is not visualized. Aorta: The aortic root is normal in size and structure. Venous: The inferior vena cava is dilated in size with greater than 50% respiratory variability, suggesting right atrial pressure of 8 mmHg. IAS/Shunts: No atrial level shunt detected by color flow Doppler.  LEFT VENTRICLE PLAX 2D LVIDd:         4.60 cm LVIDs:         3.50 cm LV PW:         0.70 cm LV IVS:        0.70 cm LVOT diam:     2.00 cm LV SV:         51 LV SV Index:   22 LVOT Area:     3.14 cm  LV Volumes (MOD) LV vol d, MOD A2C: 114.0 ml LV vol d, MOD A4C: 143.0  ml LV vol s, MOD A2C: 54.6 ml LV vol s, MOD A4C: 59.8 ml LV SV MOD A2C:     59.4 ml LV SV MOD A4C:     143.0 ml LV SV MOD BP:      71.8 ml RIGHT VENTRICLE RV S prime:     13.40 cm/s TAPSE (M-mode): 2.0 cm LEFT ATRIUM             Index       RIGHT ATRIUM           Index LA diam:        3.10 cm 1.34 cm/m  RA Area:     11.40 cm LA Vol (A2C):   22.6 ml 9.77 ml/m  RA Volume:   21.20 ml  9.16 ml/m LA Vol (A4C):   30.2 ml 13.05 ml/m LA Biplane Vol: 26.3 ml 11.37 ml/m  AORTIC VALVE LVOT Vmax:   108.00 cm/s LVOT Vmean:  66.200 cm/s LVOT VTI:    0.163 m  AORTA Ao Root diam: 3.70 cm  SHUNTS Systemic VTI:  0.16 m Systemic Diam: 2.00 cm Loralie Champagne MD Electronically signed by Loralie Champagne MD Signature Date/Time: 05/16/2020/4:48:46 PM    Final    IR IMAGING GUIDED PORT INSERTION  Result Date: 04/17/2020 INDICATION: Metastatic lung cancer. In need of durable intravenous access for chemotherapy administration. EXAM: IMPLANTED PORT A CATH PLACEMENT WITH ULTRASOUND AND FLUOROSCOPIC GUIDANCE COMPARISON:  PET-CT-03/28/2020; chest CT-03/26/2020 MEDICATIONS: Ancef 2 gm IV; The antibiotic was administered within an appropriate time interval prior to skin puncture. ANESTHESIA/SEDATION: Moderate (conscious) sedation was employed during this procedure. A total of Versed 2 mg and Fentanyl 100 mcg was administered intravenously. Moderate Sedation Time: 28 minutes. The patient's level of consciousness and vital signs were monitored continuously by radiology nursing throughout the procedure under my direct supervision. CONTRAST:  None FLUOROSCOPY TIME:  36 seconds (14 mGy) COMPLICATIONS: None immediate. PROCEDURE: The procedure, risks, benefits, and alternatives were explained to the patient. Questions regarding the procedure were encouraged and answered. The patient understands and consents to the procedure. Patient requested placement of a right-sided port a catheter despite known right supraclavicular lymphadenopathy as he sleeps on  his left side with his arm above his head. Sonographic evaluation performed of the right neck again demonstrated supraclavicular adenopathy of though also demonstrated an adequate percutaneous window for Port a catheter placement. Given patient's request, the decision was made to proceed with right-sided  port a catheter placement. As such the right neck and chest were prepped with chlorhexidine in a sterile fashion, and a sterile drape was applied covering the operative field. Maximum barrier sterile technique with sterile gowns and gloves were used for the procedure. A timeout was performed prior to the initiation of the procedure. Local anesthesia was provided with 1% lidocaine with epinephrine. After creating a small venotomy incision, a micropuncture kit was utilized to access the internal jugular vein. Real-time ultrasound guidance was utilized for vascular access including the acquisition of a permanent ultrasound image documenting patency of the accessed vessel. The microwire was utilized to measure appropriate catheter length. A subcutaneous port pocket was then created along the upper chest wall utilizing a combination of sharp and blunt dissection. The pocket was irrigated with sterile saline. A single lumen "standard sized" power injectable port was chosen for placement. The 8 Fr catheter was tunneled from the port pocket site to the venotomy incision. The port was placed in the pocket. The external catheter was trimmed to appropriate length. At the venotomy, an 8 Fr peel-away sheath was placed over a guidewire under fluoroscopic guidance. The catheter was then placed through the sheath and the sheath was removed. Final catheter positioning was confirmed and documented with a fluoroscopic spot radiograph. The port was accessed with a Huber needle, aspirated and flushed with heparinized saline. The venotomy site was closed with an interrupted 4-0 Vicryl suture. The port pocket incision was closed with  interrupted 2-0 Vicryl suture. The skin was opposed with a running subcuticular 4-0 Vicryl suture. Dermabond and Steri-strips were applied to both incisions. Dressings were applied. The patient tolerated the procedure well without immediate post procedural complication. FINDINGS: After catheter placement, the tip lies within the superior cavoatrial junction. The catheter aspirates and flushes normally and is ready for immediate use. IMPRESSION: Successful placement of a right internal jugular approach power injectable Port-A-Cath. The catheter is ready for immediate use. Again, right internal jugular approach port a catheter placement was performed per patient request after sonographic evaluation demonstrated an adequate percutaneous access to the right internal jugular vein despite known right supraclavicular lymphadenopathy. Electronically Signed   By: Sandi Mariscal M.D.   On: 04/17/2020 14:47    ASSESSMENT AND PLAN: Mr. Ingwersen is a 69 year old Caucasian male with extensive stage (T4, N3, M1c) small cell lung cancer who presented with a large right upper lobe lung mass in addition to mediastinal and right supraclavicular lymphadenopathy and multiple metastatic liver lesions diagnosed in April 2021.  He has been receiving systemic chemotherapy with carboplatin for an AUC of 5 on day 1 and etoposide 100 mg/m on days 1, 2, and 3 in addition to Cosela for mild protection during the days of chemotherapy.  He also receives immunotherapy with Imfinzi on day 1 of each cycle of chemotherapy.  He has completed 1 cycle of chemotherapy and started cycle #2 on 05/15/2020.  He developed a PE and was admitted to the hospital and therefore has not received day 2 or day 3 of his chemotherapy yet.  Discussed with cancer center and we cannot proceed with day 2 until he has been out of the hospital for at least 24 hours.  Therefore, I have scheduled day 2 of his treatment for 05/18/2020 and we will omit day 3 of his chemotherapy as  our infusion center and pharmacy are not open on Saturdays and Cosela is only stable for approximately 12 hours so it cannot be mixed the day prior.  The patient should also keep his appointments for weekly labs and his follow-up visit already scheduled on 06/04/2020.  For his new diagnosis of pulmonary embolism, continue Eliquis 10 mg twice daily for 7 days and then 5 mg twice daily.    LOS: 1 day   Adam Bussing, DNP, AGPCNP-BC, AOCNP 05/17/20

## 2020-05-17 NOTE — Progress Notes (Signed)
VAST consulted to flush and de-access port. RN unable to obtain blood return when flushing; however, pt in a different position than usual. Pt reported he has never had any issues with his port. RN educated pt should report issue to nurse tomorrow when he goes in for infusion. Pt verbalized understanding.

## 2020-05-17 NOTE — Progress Notes (Signed)
Patient ID: Adam Wise, male   DOB: 01-16-1951, 69 y.o.   MRN: [id number] Patient was supposed to be discharged on 05/16/2020 but on ambulating, his oxygen saturations dropped to the 80s and he required supplemental oxygen.  He was supposed to be discharged on oxygen via nasal cannula at 2 L/min yesterday but he did not feel comfortable leaving on supplemental oxygen.  Discharge was held.  He feels better this morning and feels that he is ready to be discharged today on 2 L oxygen via nasal cannula.  Oncology is deciding if he will have chemotherapy today or tomorrow.  He is stable for discharge today on Eliquis.  Please refer to the discharge summary done by me on 05/16/2020 for full details.

## 2020-05-18 ENCOUNTER — Inpatient Hospital Stay: Payer: Medicare Other

## 2020-05-18 ENCOUNTER — Other Ambulatory Visit: Payer: Self-pay | Admitting: Medical Oncology

## 2020-05-18 ENCOUNTER — Other Ambulatory Visit: Payer: Self-pay

## 2020-05-18 VITALS — BP 128/78 | HR 91 | Temp 98.3°F | Resp 18

## 2020-05-18 DIAGNOSIS — C3411 Malignant neoplasm of upper lobe, right bronchus or lung: Secondary | ICD-10-CM

## 2020-05-18 DIAGNOSIS — Z5112 Encounter for antineoplastic immunotherapy: Secondary | ICD-10-CM | POA: Diagnosis not present

## 2020-05-18 DIAGNOSIS — I2699 Other pulmonary embolism without acute cor pulmonale: Secondary | ICD-10-CM | POA: Diagnosis not present

## 2020-05-18 DIAGNOSIS — E119 Type 2 diabetes mellitus without complications: Secondary | ICD-10-CM | POA: Diagnosis not present

## 2020-05-18 DIAGNOSIS — C787 Secondary malignant neoplasm of liver and intrahepatic bile duct: Secondary | ICD-10-CM | POA: Diagnosis not present

## 2020-05-18 DIAGNOSIS — Z5189 Encounter for other specified aftercare: Secondary | ICD-10-CM | POA: Diagnosis not present

## 2020-05-18 DIAGNOSIS — Z95828 Presence of other vascular implants and grafts: Secondary | ICD-10-CM

## 2020-05-18 DIAGNOSIS — Z452 Encounter for adjustment and management of vascular access device: Secondary | ICD-10-CM | POA: Diagnosis not present

## 2020-05-18 DIAGNOSIS — J0111 Acute recurrent frontal sinusitis: Secondary | ICD-10-CM | POA: Diagnosis not present

## 2020-05-18 DIAGNOSIS — Z5111 Encounter for antineoplastic chemotherapy: Secondary | ICD-10-CM | POA: Diagnosis present

## 2020-05-18 DIAGNOSIS — R0602 Shortness of breath: Secondary | ICD-10-CM | POA: Diagnosis not present

## 2020-05-18 MED ORDER — SODIUM CHLORIDE 0.9 % IV SOLN
10.0000 mg | Freq: Once | INTRAVENOUS | Status: AC
  Administered 2020-05-18: 10 mg via INTRAVENOUS
  Filled 2020-05-18: qty 1

## 2020-05-18 MED ORDER — SODIUM CHLORIDE 0.9% FLUSH
10.0000 mL | INTRAVENOUS | Status: DC | PRN
  Administered 2020-05-18: 10 mL
  Filled 2020-05-18: qty 10

## 2020-05-18 MED ORDER — SODIUM CHLORIDE 0.9 % IV SOLN
100.0000 mg/m2 | Freq: Once | INTRAVENOUS | Status: AC
  Administered 2020-05-18: 240 mg via INTRAVENOUS
  Filled 2020-05-18: qty 12

## 2020-05-18 MED ORDER — TRILACICLIB DIHYDROCHLORIDE INJECTION 300 MG
240.0000 mg/m2 | Freq: Once | INTRAVENOUS | Status: AC
  Administered 2020-05-18: 600 mg via INTRAVENOUS
  Filled 2020-05-18: qty 40

## 2020-05-18 MED ORDER — ALTEPLASE 2 MG IJ SOLR
INTRAMUSCULAR | Status: AC
  Filled 2020-05-18: qty 2

## 2020-05-18 MED ORDER — ALTEPLASE 2 MG IJ SOLR
2.0000 mg | Freq: Once | INTRAMUSCULAR | Status: AC | PRN
  Administered 2020-05-18: 2 mg
  Filled 2020-05-18: qty 2

## 2020-05-18 MED ORDER — SODIUM CHLORIDE 0.9 % IV SOLN
Freq: Once | INTRAVENOUS | Status: AC
  Filled 2020-05-18: qty 250

## 2020-05-18 MED ORDER — HEPARIN SOD (PORK) LOCK FLUSH 100 UNIT/ML IV SOLN
500.0000 [IU] | Freq: Once | INTRAVENOUS | Status: AC | PRN
  Administered 2020-05-18: 500 [IU]
  Filled 2020-05-18: qty 5

## 2020-05-18 NOTE — Patient Instructions (Signed)
[facility] Discharge Instructions for Patients Receiving Chemotherapy  Today you received the following chemotherapy agents: Etoposide  To help prevent nausea and vomiting after your treatment, we encourage you to take your nausea medication as prescribed.    If you develop nausea and vomiting that is not controlled by your nausea medication, call the clinic.   BELOW ARE SYMPTOMS THAT SHOULD BE REPORTED IMMEDIATELY:  *FEVER GREATER THAN 100.5 F  *CHILLS WITH OR WITHOUT FEVER  NAUSEA AND VOMITING THAT IS NOT CONTROLLED WITH YOUR NAUSEA MEDICATION  *UNUSUAL SHORTNESS OF BREATH  *UNUSUAL BRUISING OR BLEEDING  TENDERNESS IN MOUTH AND THROAT WITH OR WITHOUT PRESENCE OF ULCERS  *URINARY PROBLEMS  *BOWEL PROBLEMS  UNUSUAL RASH Items with * indicate a potential emergency and should be followed up as soon as possible.  Feel free to call the clinic should you have any questions or concerns. The clinic phone number is [phone].  Please show the [place] at check-in to the Emergency Department and triage nurse.

## 2020-05-18 NOTE — Progress Notes (Signed)
Per Dr. Julien Nordmann, patient to receive D2 cosela/etoposide today, 6/4. D3 will be cancelled for this cycle only due to inability to mix Cosela head and administer Saturday. Treatment this cycle was held up due to admission mid-cycle.   Demetrius Charity, PharmD, BCPS, [facility] Pharmacist Pharmacy Phone: [phone] 05/18/2020

## 2020-05-19 ENCOUNTER — Ambulatory Visit: Payer: Medicare Other

## 2020-05-19 ENCOUNTER — Other Ambulatory Visit: Payer: Self-pay | Admitting: Radiation Oncology

## 2020-05-21 ENCOUNTER — Inpatient Hospital Stay: Payer: Medicare Other

## 2020-05-21 ENCOUNTER — Other Ambulatory Visit: Payer: Self-pay

## 2020-05-21 DIAGNOSIS — Z5112 Encounter for antineoplastic immunotherapy: Secondary | ICD-10-CM | POA: Diagnosis not present

## 2020-05-21 DIAGNOSIS — C3411 Malignant neoplasm of upper lobe, right bronchus or lung: Secondary | ICD-10-CM

## 2020-05-21 DIAGNOSIS — Z95828 Presence of other vascular implants and grafts: Secondary | ICD-10-CM

## 2020-05-21 LAB — CBC WITH DIFFERENTIAL (CANCER CENTER ONLY)
Abs Immature Granulocytes: 0.03 10*3/uL (ref 0.00–0.07)
Basophils Absolute: 0.1 10*3/uL (ref 0.0–0.1)
Basophils Relative: 1 %
Eosinophils Absolute: 0 10*3/uL (ref 0.0–0.5)
Eosinophils Relative: 1 %
HCT: 38.2 % — ABNORMAL LOW (ref 39.0–52.0)
Hemoglobin: 12.6 g/dL — ABNORMAL LOW (ref 13.0–17.0)
Immature Granulocytes: 1 %
Lymphocytes Relative: 7 %
Lymphs Abs: 0.3 10*3/uL — ABNORMAL LOW (ref 0.7–4.0)
MCH: 29.9 pg (ref 26.0–34.0)
MCHC: 33 g/dL (ref 30.0–36.0)
MCV: 90.5 fL (ref 80.0–100.0)
Monocytes Absolute: 0.1 10*3/uL (ref 0.1–1.0)
Monocytes Relative: 3 %
Neutro Abs: 4.2 10*3/uL (ref 1.7–7.7)
Neutrophils Relative %: 87 %
Platelet Count: 227 10*3/uL (ref 150–400)
RBC: 4.22 MIL/uL (ref 4.22–5.81)
RDW: 14.4 % (ref 11.5–15.5)
WBC Count: 4.8 10*3/uL (ref 4.0–10.5)
nRBC: 0 % (ref 0.0–0.2)

## 2020-05-21 LAB — CMP (CANCER CENTER ONLY)
ALT: 17 U/L (ref 0–44)
AST: 12 U/L — ABNORMAL LOW (ref 15–41)
Albumin: 3.3 g/dL — ABNORMAL LOW (ref 3.5–5.0)
Alkaline Phosphatase: 116 U/L (ref 38–126)
Anion gap: 10 (ref 5–15)
BUN: 19 mg/dL (ref 8–23)
CO2: 25 mmol/L (ref 22–32)
Calcium: 8.5 mg/dL — ABNORMAL LOW (ref 8.9–10.3)
Chloride: 102 mmol/L (ref 98–111)
Creatinine: 1.2 mg/dL (ref 0.61–1.24)
GFR, Est AFR Am: >60 mL/min (ref >60)
GFR, Estimated: >60 mL/min (ref >60)
Glucose, Bld: 237 mg/dL — ABNORMAL HIGH (ref 70–99)
Potassium: 4.5 mmol/L (ref 3.5–5.1)
Sodium: 137 mmol/L (ref 135–145)
Total Bilirubin: 0.5 mg/dL (ref 0.3–1.2)
Total Protein: 5.9 g/dL — ABNORMAL LOW (ref 6.5–8.1)

## 2020-05-21 MED ORDER — SODIUM CHLORIDE 0.9% FLUSH
10.0000 mL | INTRAVENOUS | Status: DC | PRN
  Administered 2020-05-21: 10 mL via INTRAVENOUS
  Filled 2020-05-21: qty 10

## 2020-05-21 MED ORDER — HEPARIN SOD (PORK) LOCK FLUSH 100 UNIT/ML IV SOLN
500.0000 [IU] | Freq: Once | INTRAVENOUS | Status: AC
  Administered 2020-05-21: 500 [IU] via INTRAVENOUS
  Filled 2020-05-21: qty 5

## 2020-05-28 ENCOUNTER — Inpatient Hospital Stay: Payer: Medicare Other

## 2020-05-28 ENCOUNTER — Other Ambulatory Visit: Payer: Self-pay

## 2020-05-28 DIAGNOSIS — C3411 Malignant neoplasm of upper lobe, right bronchus or lung: Secondary | ICD-10-CM

## 2020-05-28 DIAGNOSIS — Z5112 Encounter for antineoplastic immunotherapy: Secondary | ICD-10-CM | POA: Diagnosis not present

## 2020-05-28 LAB — CBC WITH DIFFERENTIAL (CANCER CENTER ONLY)
Abs Immature Granulocytes: 0.01 10*3/uL (ref 0.00–0.07)
Basophils Absolute: 0 10*3/uL (ref 0.0–0.1)
Basophils Relative: 2 %
Eosinophils Absolute: 0.1 10*3/uL (ref 0.0–0.5)
Eosinophils Relative: 2 %
HCT: 36.2 % — ABNORMAL LOW (ref 39.0–52.0)
Hemoglobin: 11.8 g/dL — ABNORMAL LOW (ref 13.0–17.0)
Immature Granulocytes: 1 %
Lymphocytes Relative: 11 %
Lymphs Abs: 0.2 10*3/uL — ABNORMAL LOW (ref 0.7–4.0)
MCH: 29.7 pg (ref 26.0–34.0)
MCHC: 32.6 g/dL (ref 30.0–36.0)
MCV: 91.2 fL (ref 80.0–100.0)
Monocytes Absolute: 0.4 10*3/uL (ref 0.1–1.0)
Monocytes Relative: 19 %
Neutro Abs: 1.3 10*3/uL — ABNORMAL LOW (ref 1.7–7.7)
Neutrophils Relative %: 65 %
Platelet Count: 96 10*3/uL — ABNORMAL LOW (ref 150–400)
RBC: 3.97 MIL/uL — ABNORMAL LOW (ref 4.22–5.81)
RDW: 15.3 % (ref 11.5–15.5)
WBC Count: 2.1 10*3/uL — ABNORMAL LOW (ref 4.0–10.5)
nRBC: 0 % (ref 0.0–0.2)

## 2020-05-28 LAB — CMP (CANCER CENTER ONLY)
ALT: 17 U/L (ref 0–44)
AST: 13 U/L — ABNORMAL LOW (ref 15–41)
Albumin: 3.4 g/dL — ABNORMAL LOW (ref 3.5–5.0)
Alkaline Phosphatase: 103 U/L (ref 38–126)
Anion gap: 10 (ref 5–15)
BUN: 15 mg/dL (ref 8–23)
CO2: 27 mmol/L (ref 22–32)
Calcium: 8.5 mg/dL — ABNORMAL LOW (ref 8.9–10.3)
Chloride: 104 mmol/L (ref 98–111)
Creatinine: 1.13 mg/dL (ref 0.61–1.24)
GFR, Est AFR Am: >60 mL/min (ref >60)
GFR, Estimated: >60 mL/min (ref >60)
Glucose, Bld: 164 mg/dL — ABNORMAL HIGH (ref 70–99)
Potassium: 4.4 mmol/L (ref 3.5–5.1)
Sodium: 141 mmol/L (ref 135–145)
Total Bilirubin: 0.3 mg/dL (ref 0.3–1.2)
Total Protein: 6.1 g/dL — ABNORMAL LOW (ref 6.5–8.1)

## 2020-05-29 ENCOUNTER — Telehealth: Payer: Self-pay | Admitting: Emergency Medicine

## 2020-05-29 ENCOUNTER — Inpatient Hospital Stay (HOSPITAL_BASED_OUTPATIENT_CLINIC_OR_DEPARTMENT_OTHER): Payer: Medicare Other | Admitting: Medical

## 2020-05-29 ENCOUNTER — Other Ambulatory Visit: Payer: Self-pay

## 2020-05-29 VITALS — BP 127/81 | HR 106 | Temp 98.8°F | Resp 18 | Ht 69.0 in | Wt 265.4 lb

## 2020-05-29 DIAGNOSIS — R05 Cough: Secondary | ICD-10-CM | POA: Diagnosis not present

## 2020-05-29 DIAGNOSIS — J0111 Acute recurrent frontal sinusitis: Secondary | ICD-10-CM

## 2020-05-29 DIAGNOSIS — C3411 Malignant neoplasm of upper lobe, right bronchus or lung: Secondary | ICD-10-CM | POA: Diagnosis not present

## 2020-05-29 DIAGNOSIS — R059 Cough, unspecified: Secondary | ICD-10-CM

## 2020-05-29 MED ORDER — HYDROCOD POLST-CPM POLST ER 10-8 MG/5ML PO SUER: 5.0000 mL | Freq: Every evening | ORAL | 0 refills | Status: DC | PRN

## 2020-05-29 MED ORDER — AMOXICILLIN-POT CLAVULANATE 875-125 MG PO TABS
1.0000 | ORAL_TABLET | Freq: Two times a day (BID) | ORAL | 0 refills | Status: DC
Start: 2020-05-29 — End: 2020-06-26

## 2020-05-29 NOTE — Progress Notes (Signed)
Symptoms Management Clinic Progress Note   Sasuke Yaffe [id number] [birth year] 69 y.o.  Cortlan Dolin is managed by Dr. Julien Nordmann.  Actively treated with chemotherapy/immunotherapy/hormonal therapy: yes  Current therapy: Systemic chemotherapy with carboplatin for AUC of 5 on day 1, etoposide 1000mg /M2 on days 1, 2, and 3 in addition to Imfinzi 1500mg  IV every 3 weeks with chemotherapy then every 4 weeks for maintenance if the patient has no evidence for progression. He will also receive Cosela 240mg /m2 on the days of chemotherapy.  Last treated: 06 / 04 / 2021  Next scheduled appointment with provider: 06 / 21 / 2021  Assessment: Plan:    Cough - Plan: chlorpheniramine-HYDROcodone (TUSSIONEX) 10-8 MG/5ML SUER  Acute recurrent frontal sinusitis - Plan: amoxicillin-clavulanate (AUGMENTIN) 875-125 MG tablet  Small cell lung cancer, right upper lobe (HCC)   Cough: Prescribed chlorpheniramine-hydrocodone 10-8mg /82mL at bedtime prn to help with cough and sleep, since the cough has been waking the patient up at night.   Acute recurrent frontal sinusitis: Prescribed amoxicillin-clavulanate 875-125 mg tablet BID x 10 days. Patient was recommended to take medication with food to decrease stomach upset.   Small cell lung cancer: The patient continues to be managed by Dr. Julien Nordmann and is currently treated with carboplatin, etoposide, Imfinzi, and Cosela.   Please see After Visit Summary for patient specific instructions.  Future Appointments  Date Time Provider [place]  06/04/2020 10:15 AM [facility]FLUSH [facility] None  06/04/2020 10:30 AM [facility] [place] FLUSH [facility] None  06/04/2020 11:00 AM Heilingoetter, Cassandra L, PA-C [facility] None  06/04/2020 12:00 PM [facility] INFUSION [facility] None  06/05/2020 10:30 AM [facility] 2 [facility] [place]  06/05/2020  1:30 PM [facility] INFUSION [facility] None  06/06/2020  9:30 AM [facility] INFUSION [facility] None  06/08/2020 11:30  AM [facility] [place] FLUSH [facility] None  11/05/2020  9:05 AM [facility] LAB [facility]  11/05/2020 12:00 PM [facility] NURSE HEALTH ADVISOR [facility]EC    No orders of the defined types were placed in this encounter.      Subjective:   Patient ID:  Traveon Louro is a 69 y.o. (DOB 05-Feb-1951) male.  Chief Complaint:  Chief Complaint  Patient presents with   Cough   Shortness of Breath    HPI Ruairi Stutsman is a 69 year old male patient with extensive stage (T4, N3, M1c) small cell lung cancer diagnosed in April 2021. He is currently being treated with systemic chemotherapy with carboplatin for AUC of 5 on day 1, etoposide 1000mg /M2 on days 1, 2, and 3 in addition to Imfinzi 1500mg  IV every 3 weeks with chemotherapy then every 4 weeks for maintenance if the patient has no evidence for progression. He will also receive Cosela 240mg /m2 on the days of chemotherapy. The patient presented to the symptom management clinic today with complaints of a persistent dry cough for the past week in addition to shortness of breath on exertion. The patient and his wife were concerned since this was the same presentation as a couple weeks ago where the patient was admitted to the hospital for a pulmonary embolism. The patient states he is taking Eliquis daily and has not missed any doses. He states he has had a dry cough, however this morning he coughed up some yellow tinged sputum. He was concerned about having another sinus infection and states he frequently has sinus infections due to his CPAP machine. He complains of a runny nose, congestion, and post nasal drip that is alleviated by some cough syrup at night.  He denies any improvement in symptoms after taking allergy medicine during the day. The patient also states he had night sweats last night but denies fever when taking his temperature. He denies chest pain at today's visit, but states he has had some reflux and chest burning recently when he lays down in bed.  He denies any chest pain, palpitations, fevers, chills, nausea, vomiting, diarrhea, or any abdominal discomfort. He denies any sick contacts.  Medications: I have reviewed the patient's current medications.  Allergies:  Allergies  Allergen Reactions   Bupropion     Racing heart    Past Medical History:  Diagnosis Date   Chickenpox    Chronic knee pain    Chronic low back pain    Essential hypertension    GERD (gastroesophageal reflux disease)    OSA (obstructive sleep apnea)    Testosterone deficiency    Type 2 diabetes mellitus ([place])     Past Surgical History:  Procedure Laterality Date   COLONOSCOPY WITH PROPOFOL N/A 12/17/2018   Procedure: COLONOSCOPY WITH PROPOFOL;  Surgeon: Virgel Manifold, MD;  Location: [facility] ENDOSCOPY;  Service: Gastroenterology;  Laterality: N/A;   IR IMAGING GUIDED PORT INSERTION  04/17/2020   JOINT REPLACEMENT Bilateral    REPLACEMENT TOTAL KNEE BILATERAL  2015   TONSILLECTOMY  1960    Family History  Problem Relation Age of Onset   Cancer Mother    Hypertension Mother    Arthritis Father    Asthma Father    Cancer Father    COPD Father    Heart attack Father    Hypertension Sister    Cancer Sister    Diabetes Sister    Asthma Son    Birth defects Maternal Grandfather    Arthritis Paternal Grandmother    Diabetes Paternal Grandmother    Arthritis Paternal Grandfather    Asthma Sister    Cancer Sister    COPD Sister    Arthritis Sister    Asthma Sister    Diabetes Sister    Other Neg Hx        pituitary disorder    Social History   Socioeconomic History   Marital status: Married    Spouse name: Not on file   Number of children: Not on file   Years of education: Not on file   Highest education level: Not on file  Occupational History   Not on file  Tobacco Use   Smoking status: Former Smoker   Smokeless tobacco: Never Used  Scientific laboratory technician Use: Never used  Substance and  Sexual Activity   Alcohol use: Yes    Comment: rarely   Drug use: Not Currently   Sexual activity: Yes  Other Topics Concern   Not on file  Social History Narrative   Married.   Moved from [place].   Retired.   Social Determinants of Health   Financial Resource Strain: Low Risk    Difficulty of Paying Living Expenses: Not hard at all  Food Insecurity: No Food Insecurity   Worried About Charity fundraiser in the Last Year: Never true   [place] in the Last Year: Never true  Transportation Needs: No Transportation Needs   Lack of Transportation (Medical): No   Lack of Transportation (Non-Medical): No  Physical Activity: Inactive   Days of Exercise per Week: 0 days   Minutes of Exercise per Session: 0 min  Stress: No Stress Concern Present   Feeling of Stress :  Not at all  Social Connections:    Frequency of Communication with Friends and Family:    Frequency of Social Gatherings with Friends and Family:    Attends Religious Services:    Active Member of Clubs or Organizations:    Attends Music therapist:    Marital Status:   Intimate Partner Violence: Not At Risk   Fear of Current or Ex-Partner: No   Emotionally Abused: No   Physically Abused: No   Sexually Abused: No    Past Medical History, Surgical history, Social history, and Family history were reviewed and updated as appropriate.   Please see review of systems for further details on the patient's review from today.   Review of Systems:  Review of Systems  Constitutional: Negative for appetite change, chills, diaphoresis, fatigue and fever.  HENT: Positive for congestion and postnasal drip. Negative for ear discharge, ear pain, hearing loss and sinus pain.   Respiratory: Positive for cough. Negative for chest tightness and shortness of breath.        Exertional dyspnea  Cardiovascular: Negative for chest pain, palpitations and leg swelling.  Gastrointestinal: Negative for  abdominal distention, abdominal pain, blood in stool, constipation, diarrhea, nausea and vomiting.    Objective:   Physical Exam:  BP 127/81 (BP Location: Left Arm, Patient Position: Sitting)    Pulse (!) 106    Temp 98.8 F (37.1 C) (Temporal)    Resp 18    Ht 5\' 9"  (1.753 m)    Wt 265 lb 6.4 oz (120.4 kg)    SpO2 96%    BMI 39.19 kg/m  ECOG: 0  Physical Exam Constitutional:      General: He is not in acute distress.    Appearance: He is well-developed. He is not ill-appearing.  HENT:     Head: Normocephalic and atraumatic.     Right Ear: Tympanic membrane, ear canal and external ear normal.     Left Ear: Tympanic membrane, ear canal and external ear normal.  Cardiovascular:     Rate and Rhythm: Regular rhythm. Tachycardia present.     Pulses: Normal pulses.     Heart sounds: Normal heart sounds.  Pulmonary:     Effort: Pulmonary effort is normal.     Breath sounds: Normal breath sounds.  Skin:    General: Skin is warm and dry.  Neurological:     Mental Status: He is alert.     Lab Review:     Component Value Date/Time   NA 141 05/28/2020 1115   K 4.4 05/28/2020 1115   CL 104 05/28/2020 1115   CO2 27 05/28/2020 1115   GLUCOSE 164 (H) 05/28/2020 1115   BUN 15 05/28/2020 1115   CREATININE 1.13 05/28/2020 1115   CALCIUM 8.5 (L) 05/28/2020 1115   PROT 6.1 (L) 05/28/2020 1115   ALBUMIN 3.4 (L) 05/28/2020 1115   AST 13 (L) 05/28/2020 1115   ALT 17 05/28/2020 1115   ALKPHOS 103 05/28/2020 1115   BILITOT 0.3 05/28/2020 1115   GFRNONAA >60 05/28/2020 1115   GFRAA >60 05/28/2020 1115       Component Value Date/Time   WBC 2.1 (L) 05/28/2020 1115   WBC 4.4 05/17/2020 0331   RBC 3.97 (L) 05/28/2020 1115   HGB 11.8 (L) 05/28/2020 1115   HCT 36.2 (L) 05/28/2020 1115   HCT 42.7 10/06/2017 1052   PLT 96 (L) 05/28/2020 1115   MCV 91.2 05/28/2020 1115   MCH 29.7 05/28/2020 1115  MCHC 32.6 05/28/2020 1115   RDW 15.3 05/28/2020 1115   LYMPHSABS 0.2 (L) 05/28/2020 1115     MONOABS 0.4 05/28/2020 1115   EOSABS 0.1 05/28/2020 1115   BASOSABS 0.0 05/28/2020 1115   -------------------------------  Imaging from last 24 hours (if applicable):  Radiology interpretation: CT ANGIO CHEST PE W OR WO CONTRAST  Addendum Date: 05/15/2020   ADDENDUM REPORT: 05/15/2020 18:15 ADDENDUM: Critical Value/emergent results were called by telephone at the time of interpretation on 05/15/2020 at 6:10 pm to on-call provider DR. KALE, who verbally acknowledged these results. Patient was instructed to proceed to the [place] for further evaluation and treatment at this time. Electronically Signed   By: Ilona Sorrel M.D.   On: 05/15/2020 18:15   Result Date: 05/15/2020 CLINICAL DATA:  Stage IV right lung cancer with ongoing chemotherapy. Dyspnea for 3 days. EXAM: CT ANGIOGRAPHY CHEST WITH CONTRAST TECHNIQUE: Multidetector CT imaging of the chest was performed using the standard protocol during bolus administration of intravenous contrast. Multiplanar CT image reconstructions and MIPs were obtained to evaluate the vascular anatomy. CONTRAST:  150mL OMNIPAQUE IOHEXOL 350 MG/ML SOLN COMPARISON:  03/28/2020 PET-CT.  03/26/2020 chest CT. FINDINGS: Cardiovascular: The study is high quality for the evaluation of pulmonary embolism. There is acute left-sided pulmonary embolism involving the distal left pulmonary artery and lobar, segmental and subsegmental left upper and left lower lobe branches. No saddle embolus. Atherosclerotic nonaneurysmal thoracic aorta. Dilated main pulmonary artery (3.8 cm diameter), unchanged. Top-normal heart size. No significant pericardial fluid/thickening. Three-vessel coronary atherosclerosis. Right internal jugular Port-A-Cath terminates at the cavoatrial junction. Mediastinum/Nodes: No discrete thyroid nodules. Unremarkable esophagus. No axillary adenopathy. Enlarged 1.5 cm right pericardiophrenic node (series 5/image 157), previously 1.6 cm, not appreciably changed.  Short axis diameter 3.5 cm enlarged right paratracheal node (series 5/image 93), previously 4.6 cm using similar measurement technique, decreased. Enlarged 2.1 cm subcarinal node (series 5/image 116), decreased from 3.7 cm. Previously enlarged 1.4 cm left paratracheal node is decreased to 00.9 cm (series 5/image 96). Lungs/Pleura: No pneumothorax. Small dependent right pleural effusion, stable. No left pleural effusion. Poorly marginated central right upper lobe 6.3 x 3.3 cm lung mass (series 6/image 51), decreased from approximately 10.1 x 6.9 cm using similar measurement technique. Persistent patchy bandlike and nodular foci of consolidation throughout the peripheral right upper lobe, decreased. Interlobular septal thickening throughout the right lung is increased. No new significant pulmonary nodules. Upper abdomen: Numerous (greater than 10) small hypodense liver lesions scattered throughout the visualized liver, decreased since 03/28/2020 PET-CT, for example measuring 1.2 cm in the segment 4A left liver lobe (series 4/image 74), previously 2.3 cm. Exophytic simple 3.4 cm lateral upper right renal cyst. Musculoskeletal: No aggressive appearing focal osseous lesions. Marked thoracic spondylosis. Review of the MIP images confirms the above findings. IMPRESSION: 1. Acute left-sided pulmonary embolism extending proximally to the distal left pulmonary artery. No saddle embolus. 2. Positive interval response to therapy. Decreased liver metastases. Decreased mediastinal lymphadenopathy. Poorly marginated central right upper lobe lung mass is decreased. Decreased nonspecific peripheral right upper lobe patchy bandlike and nodular foci of consolidation compatible with a combination of postobstructive pneumonia, atelectasis and/or tumor. 3. Interlobular septal thickening throughout the right lung is increased, nonspecific, potentially asymmetric pulmonary edema, post treatment change and/or lymphangitic tumor, suggestive  fashion on short-term follow-up chest CT. 4. Stable small dependent right pleural effusion. 5. Stable dilated main pulmonary artery, suggesting chronic pulmonary arterial hypertension. 6. Aortic Atherosclerosis (ICD10-I70.0). Electronically Signed: By: Janina Mayo.D.  On: 05/15/2020 18:07   ECHOCARDIOGRAM COMPLETE  Result Date: 05/16/2020    ECHOCARDIOGRAM REPORT   Patient Name:   [facility] Date of Exam: 05/16/2020 Medical Rec #:  [id number]    Height:       69.0 in Accession #:    [id number]   Weight:       261.0 lb Date of Birth:  December 03, 1951    BSA:          2.314 m Patient Age:    82 years     BP:           127/93 mmHg Patient Gender: M            HR:           96 bpm. Exam Location:  Inpatient Procedure: 2D Echo, Cardiac Doppler and Color Doppler Indications:    Pulmonary Embolus 415.19 / I26.99  History:        Patient has no prior history of Echocardiogram examinations.                 Signs/Symptoms:Shortness of Breath; Risk Factors:Hypertension,                 Diabetes, Dyslipidemia and Former Smoker. Pulmonary embolism.  Sonographer:    Vickie Epley RDCS Referring Phys: [place]  1. Left ventricular ejection fraction, by estimation, is 55 to 60%. The left ventricle has normal function. The left ventricle has no regional wall motion abnormalities. There is mild left ventricular hypertrophy. Indeterminate diastolic filling due to E-A fusion.  2. Right ventricular systolic function is normal. The right ventricular size is normal. Tricuspid regurgitation signal is inadequate for assessing PA pressure.  3. The mitral valve is normal in structure. No evidence of mitral valve regurgitation. No evidence of mitral stenosis.  4. The aortic valve is tricuspid. Aortic valve regurgitation is not visualized. No aortic stenosis is present.  5. The inferior vena cava is dilated in size with >50% respiratory variability, suggesting right atrial pressure of 8 mmHg. FINDINGS  Left Ventricle:  Left ventricular ejection fraction, by estimation, is 55 to 60%. The left ventricle has normal function. The left ventricle has no regional wall motion abnormalities. The left ventricular internal cavity size was normal in size. There is  mild left ventricular hypertrophy. Indeterminate diastolic filling due to E-A fusion. Right Ventricle: The right ventricular size is normal. No increase in right ventricular wall thickness. Right ventricular systolic function is normal. Tricuspid regurgitation signal is inadequate for assessing PA pressure. Left Atrium: Left atrial size was normal in size. Right Atrium: Right atrial size was normal in size. Pericardium: There is no evidence of pericardial effusion. Mitral Valve: The mitral valve is normal in structure. Mild mitral annular calcification. No evidence of mitral valve regurgitation. No evidence of mitral valve stenosis. Tricuspid Valve: The tricuspid valve is normal in structure. Tricuspid valve regurgitation is not demonstrated. Aortic Valve: The aortic valve is tricuspid. Aortic valve regurgitation is not visualized. No aortic stenosis is present. Pulmonic Valve: The pulmonic valve was normal in structure. Pulmonic valve regurgitation is not visualized. Aorta: The aortic root is normal in size and structure. Venous: The inferior vena cava is dilated in size with greater than 50% respiratory variability, suggesting right atrial pressure of 8 mmHg. IAS/Shunts: No atrial level shunt detected by color flow Doppler.  LEFT VENTRICLE PLAX 2D LVIDd:         4.60 cm LVIDs:  3.50 cm LV PW:         0.70 cm LV IVS:        0.70 cm LVOT diam:     2.00 cm LV SV:         51 LV SV Index:   22 LVOT Area:     3.14 cm  LV Volumes (MOD) LV vol d, MOD A2C: 114.0 ml LV vol d, MOD A4C: 143.0 ml LV vol s, MOD A2C: 54.6 ml LV vol s, MOD A4C: 59.8 ml LV SV MOD A2C:     59.4 ml LV SV MOD A4C:     143.0 ml LV SV MOD BP:      71.8 ml RIGHT VENTRICLE RV S prime:     13.40 cm/s TAPSE  (M-mode): 2.0 cm LEFT ATRIUM             Index       RIGHT ATRIUM           Index LA diam:        3.10 cm 1.34 cm/m  RA Area:     11.40 cm LA Vol (A2C):   22.6 ml 9.77 ml/m  RA Volume:   21.20 ml  9.16 ml/m LA Vol (A4C):   30.2 ml 13.05 ml/m LA Biplane Vol: 26.3 ml 11.37 ml/m  AORTIC VALVE LVOT Vmax:   108.00 cm/s LVOT Vmean:  66.200 cm/s LVOT VTI:    0.163 m  AORTA Ao Root diam: 3.70 cm  SHUNTS Systemic VTI:  0.16 m Systemic Diam: 2.00 cm Loralie Champagne MD Electronically signed by Loralie Champagne MD Signature Date/Time: 05/16/2020/4:48:46 PM    Final

## 2020-05-29 NOTE — Telephone Encounter (Signed)
Returning pt's VM on triage line reporting cough and slight SOB.  Pt states he believes the issue to be sinus related but since he had a PE recently he wants to be sure.  Pt agreed to 2 pm [facility] appt, high priority scheduling message sent.  Denies fever/chills or acute SOB or CP.

## 2020-05-29 NOTE — Patient Instructions (Signed)

## 2020-06-02 NOTE — Progress Notes (Signed)
[facility] OFFICE PROGRESS NOTE  [name], NP [place] [address]  DIAGNOSIS: Extensive stage (T4, N3, M1c) small cell lung cancer diagnosed in April 2021 and presented with large right upper lobe/right hilar mass with ipsilateral and contralateral mediastinal and right supraclavicular lymphadenopathy in addition to multiple liver lesions  PRIOR THERAPY: Palliative radiotherapy to the right upper lobe lung mass under the care of Dr. Lisbeth Renshaw  CURRENT THERAPY: Systemic chemotherapy with carboplatin for AUC of 5 on day 1, etoposide 100 mg/M2 on days 1, 2 and 3 in addition to Imfinzi 1500 mg IV every 3 weeks with chemotherapy then every 4 weeks for maintenance if the patient has no evidence for progression.  He will also receive Cosela 240 mg/m2 on the days of the chemotherapy.  Status post 2 cycles. He will receive neulasta for cycle #3 due to neutropenia.   INTERVAL HISTORY: Corderius Saraceni 69 y.o. male returns to the clinic for a follow up visit accompanied by his wife. At the patient's last appointment, he was endorsing shortness of breath. He had a CT angiogram which noted an acute pulmonary embolism. He is currently on eliquis. Of note, his scan noted a positive interval response to therapy with decreased liver metastases, decreased mediastinal lymphadenopathy, and a decreased in the poorly marginated central right upper lobe lung mass. He still has a persistent cough. He has a prescription for hycodan. He notes his dyspnea on exertion has improved but he still has some shortness of breath with exertion   He is scheduled for his restaging CT scan of the abdomen and pelvis tomorrow.   Since that time, he reports to feeling fairly well except he is currently finishing up a course of antibiotics with Augmentin for a sinusitis. He tolerated his last cycle of treatment well. Denies any fevers, chills, night sweats or weight loss. Denies any chest pain or  hemoptysis. Denies nausea, vomiting, diarrhea, or constipation. Denies headaches or visual changes.  He is here for evaluation before starting cycle #3.   MEDICAL HISTORY: Past Medical History:  Diagnosis Date  . Chickenpox   . Chronic knee pain   . Chronic low back pain   . Essential hypertension   . GERD (gastroesophageal reflux disease)   . OSA (obstructive sleep apnea)   . Testosterone deficiency   . Type 2 diabetes mellitus (HCC)     ALLERGIES:  is allergic to bupropion.  MEDICATIONS:  Current Outpatient Medications  Medication Sig Dispense Refill  . acetaminophen (TYLENOL) 500 MG tablet Take 1,500 mg by mouth every 8 (eight) hours as needed for moderate pain or headache.    . albuterol (VENTOLIN HFA) 108 (90 Base) MCG/ACT inhaler Inhale 2 puffs into the lungs every 6 (six) hours as needed for wheezing or shortness of breath. 18 g 0  . amLODipine (NORVASC) 10 MG tablet TAKE 1 TABLET BY MOUTH EVERY DAY (Patient taking differently: Take 10 mg by mouth daily. ) 90 tablet 1  . amoxicillin-clavulanate (AUGMENTIN) 875-125 MG tablet Take 1 tablet by mouth 2 (two) times daily. 20 tablet 0  . APIXABAN (ELIQUIS) VTE STARTER PACK ('10MG'$  AND '5MG'$ ) Take as directed on package: start with two-'5mg'$  tablets twice daily for 7 days. On day 8, switch to one-'5mg'$  tablet twice daily. 1 each 0  . Black Pepper-Turmeric (TURMERIC COMPLEX/BLACK PEPPER PO) Take 1 tablet by mouth in the morning and at bedtime.    . blood glucose meter kit and supplies KIT Dispense  based on patient and insurance preference. Use up to four times daily as directed. 1 each 0  . chlorpheniramine-HYDROcodone (TUSSIONEX) 10-8 MG/5ML SUER Take 5 mLs by mouth at bedtime as needed for cough. 140 mL 0  . Coenzyme Q10 ([employer] COQ-10) 200 MG capsule Take 200 mg by mouth every evening.     Marland Kitchen esomeprazole (NEXIUM) 20 MG capsule Take 20 mg by mouth daily.     Marland Kitchen gabapentin (NEURONTIN) 300 MG capsule Take 300 mg by mouth 2 (two) times daily.     Marland Kitchen  glipiZIDE (GLUCOTROL) 10 MG tablet TAKE 1 TABLET (10 MG TOTAL) BY MOUTH 2 (TWO) TIMES DAILY BEFORE A MEAL. FOR DIABETES. 180 tablet 2  . KRILL OIL PO Take 350 mg by mouth daily.    . Lancets MISC USE UP TO 3 TIMES DAILY AS DIRECTED 100 each 2  . lidocaine-prilocaine (EMLA) cream Apply 1 application topically as needed. (Patient taking differently: Apply 1 application topically as needed (access port). ) 30 g 0  . lisinopril (ZESTRIL) 5 MG tablet TAKE 1 TABLET BY MOUTH EVERY DAY (Patient taking differently: Take 5 mg by mouth daily. ) 90 tablet 1  . Melatonin 10 MG CAPS Take 10 mg by mouth at bedtime.    . metFORMIN (GLUCOPHAGE) 1000 MG tablet TAKE 1 TABLET (1,000 MG TOTAL) BY MOUTH 2 (TWO) TIMES DAILY WITH A MEAL. 180 tablet 1  . Multiple Vitamin (MULTI-VITAMINS) TABS Take 1 tablet by mouth daily.     Glory Rosebush ULTRA test strip USE UP TO 4 TIMES DAILY AS DIRECTED 100 strip 5  . oxymetazoline (AFRIN) 0.05 % nasal spray Place 1 spray into both nostrils 2 (two) times daily as needed for congestion.    . pioglitazone (ACTOS) 45 MG tablet TAKE 1 TABLET BY MOUTH EVERY DAY (Patient taking differently: Take 45 mg by mouth daily. ) 90 tablet 1  . pravastatin (PRAVACHOL) 40 MG tablet TAKE 1 TABLET BY MOUTH EVERY DAY IN THE EVENING FOR CHOLESTEROL (Patient taking differently: Take 40 mg by mouth daily. ) 90 tablet 1  . prochlorperazine (COMPAZINE) 10 MG tablet Take 1 tablet (10 mg total) by mouth every 6 (six) hours as needed for nausea or vomiting. 30 tablet 0  . sucralfate (CARAFATE) 1 g tablet DISSOLVE ONE TAB IN 15ML'S OF WATER AND TAKE BY MOUTH FOUR TIMES A DAY AS DIRECTED 120 tablet 0  . SYRINGE-NEEDLE, DISP, 3 ML (B-D 3CC LUER-LOK SYR 22GX1") 22G X 1" 3 ML MISC USE AS INSTRUCTED FOR TESTOSTERONE INJECTION EVERY 2 WEEKS 10 each 2  . testosterone cypionate (DEPOTESTOSTERONE CYPIONATE) 200 MG/ML injection Inject 200 mg into the muscle every 14 (fourteen) days.    . fluticasone (FLONASE) 50 MCG/ACT nasal  spray Place 2 sprays into both nostrils daily as needed for allergies.  (Patient not taking: Reported on 06/04/2020)    . HYDROcodone-homatropine (HYCODAN) 5-1.5 MG/5ML syrup Take 5 mLs by mouth at bedtime as needed for cough. (Patient not taking: Reported on 06/04/2020)    . sildenafil (VIAGRA) 50 MG tablet TAKE 1 TABLET BY MOUTH EVERY DAY AS NEEDED (Patient not taking: Reported on 06/04/2020) 12 tablet 1   No current facility-administered medications for this visit.   Facility-Administered Medications Ordered in Other Visits  Medication Dose Route Frequency Provider Last Rate Last Admin  . CARBOplatin (PARAPLATIN) 659.5 mg in sodium chloride 0.9 % 250 mL chemo infusion  659.5 mg Intravenous Once Curt Bears, MD      . durvalumab [facility]) 1,500  mg in sodium chloride 0.9 % 100 mL chemo infusion  1,500 mg Intravenous Once Curt Bears, MD      . etoposide (VEPESID) 240 mg in sodium chloride 0.9 % 1,000 mL chemo infusion  100 mg/m2 (Treatment Plan Recorded) Intravenous Once Curt Bears, MD      . fosaprepitant (EMEND) 150 mg in sodium chloride 0.9 % 145 mL IVPB  150 mg Intravenous Once Curt Bears, MD 450 mL/hr at 06/04/20 1341 150 mg at 06/04/20 1341  . heparin lock flush 100 unit/mL  500 Units Intracatheter Once PRN Curt Bears, MD      . sodium chloride flush (NS) 0.9 % injection 10 mL  10 mL Intracatheter PRN Curt Bears, MD      . trilaciclib dihydrochloride (COSELA) 600 mg in dextrose 5 % 250 mL (2.069 mg/mL) infusion  240 mg/m2 (Treatment Plan Recorded) Intravenous Once Curt Bears, MD        SURGICAL HISTORY:  Past Surgical History:  Procedure Laterality Date  . COLONOSCOPY WITH PROPOFOL N/A 12/17/2018   Procedure: COLONOSCOPY WITH PROPOFOL;  Surgeon: Virgel Manifold, MD;  Location: [facility] ENDOSCOPY;  Service: Gastroenterology;  Laterality: N/A;  . IR IMAGING GUIDED PORT INSERTION  04/17/2020  . JOINT REPLACEMENT Bilateral   . REPLACEMENT TOTAL KNEE  BILATERAL  2015  . TONSILLECTOMY  1960    REVIEW OF SYSTEMS:   Review of Systems  Constitutional: Negative for appetite change, chills, fatigue, fever and unexpected weight change.  HENT: Positive for nasal congestion. Negative for mouth sores, nosebleeds, sore throat and trouble swallowing.   Eyes: Negative for eye problems and icterus.  Respiratory: Positive for dyspnea on exertion (improved from prior). Positive for cough. Negative for  Hemoptysis and wheezing.   Cardiovascular: Negative for chest pain and leg swelling.  Gastrointestinal: Negative for abdominal pain, constipation, diarrhea, nausea and vomiting.  Genitourinary: Negative for bladder incontinence, difficulty urinating, dysuria, frequency and hematuria.   Musculoskeletal: Negative for back pain, gait problem, neck pain and neck stiffness.  Skin: Negative for itching and rash.  Neurological: Negative for dizziness, extremity weakness, gait problem, headaches, light-headedness and seizures.  Hematological: Negative for adenopathy. Does not bruise/bleed easily.  Psychiatric/Behavioral: Negative for confusion, depression and sleep disturbance. The patient is not nervous/anxious.     PHYSICAL EXAMINATION:  Blood pressure 128/85, pulse 92, temperature 97.9 F (36.6 C), temperature source Temporal, resp. rate 18, height '5\' 9"'$  (1.753 m), weight 264 lb 8 oz (120 kg), SpO2 97 %.  ECOG PERFORMANCE STATUS: 1 - Symptomatic but completely ambulatory  Physical Exam  Constitutional: Oriented to person, place, and time and well-developed, well-nourished, and in no distress.  HENT:  Head: Normocephalic and atraumatic.  Mouth/Throat: Oropharynx is clear and moist. No oropharyngeal exudate.  Eyes: Conjunctivae are normal. Right eye exhibits no discharge. Left eye exhibits no discharge. No scleral icterus.  Neck: Normal range of motion. Neck supple.  Cardiovascular: Normal rate, regular rhythm, normal heart sounds and intact distal  pulses.   Pulmonary/Chest: Effort normal and breath sounds normal. No respiratory distress. No wheezes. No rales.  Abdominal: Soft. Bowel sounds are normal. Exhibits no distension and no mass. There is no tenderness.  Musculoskeletal: Normal range of motion. Exhibits no edema.  Lymphadenopathy:    No cervical adenopathy.  Neurological: Alert and oriented to person, place, and time. Exhibits normal muscle tone. Gait normal. Coordination normal.  Skin: Skin is warm and dry. No rash noted. Not diaphoretic. No erythema. No pallor.  Psychiatric: Mood, memory and judgment  normal.  Vitals reviewed.  LABORATORY DATA: Lab Results  Component Value Date   WBC 2.2 (L) 06/04/2020   HGB 11.7 (L) 06/04/2020   HCT 36.2 (L) 06/04/2020   MCV 92.1 06/04/2020   PLT 156 06/04/2020      Chemistry      Component Value Date/Time   NA 140 06/04/2020 1050   K 4.3 06/04/2020 1050   CL 103 06/04/2020 1050   CO2 27 06/04/2020 1050   BUN 15 06/04/2020 1050   CREATININE 1.09 06/04/2020 1050      Component Value Date/Time   CALCIUM 8.5 (L) 06/04/2020 1050   ALKPHOS 80 06/04/2020 1050   AST 16 06/04/2020 1050   ALT 18 06/04/2020 1050   BILITOT 0.3 06/04/2020 1050       RADIOGRAPHIC STUDIES:  CT ANGIO CHEST PE W OR WO CONTRAST  Addendum Date: 05/15/2020   ADDENDUM REPORT: 05/15/2020 18:15 ADDENDUM: Critical Value/emergent results were called by telephone at the time of interpretation on 05/15/2020 at 6:10 pm to on-call provider DR. KALE, who verbally acknowledged these results. Patient was instructed to proceed to the [facility] for further evaluation and treatment at this time. Electronically Signed   By: Ilona Sorrel M.D.   On: 05/15/2020 18:15   Result Date: 05/15/2020 CLINICAL DATA:  Stage IV right lung cancer with ongoing chemotherapy. Dyspnea for 3 days. EXAM: CT ANGIOGRAPHY CHEST WITH CONTRAST TECHNIQUE: Multidetector CT imaging of the chest was performed using the standard protocol during bolus  administration of intravenous contrast. Multiplanar CT image reconstructions and MIPs were obtained to evaluate the vascular anatomy. CONTRAST:  166m OMNIPAQUE IOHEXOL 350 MG/ML SOLN COMPARISON:  03/28/2020 PET-CT.  03/26/2020 chest CT. FINDINGS: Cardiovascular: The study is high quality for the evaluation of pulmonary embolism. There is acute left-sided pulmonary embolism involving the distal left pulmonary artery and lobar, segmental and subsegmental left upper and left lower lobe branches. No saddle embolus. Atherosclerotic nonaneurysmal thoracic aorta. Dilated main pulmonary artery (3.8 cm diameter), unchanged. Top-normal heart size. No significant pericardial fluid/thickening. Three-vessel coronary atherosclerosis. Right internal jugular Port-A-Cath terminates at the cavoatrial junction. Mediastinum/Nodes: No discrete thyroid nodules. Unremarkable esophagus. No axillary adenopathy. Enlarged 1.5 cm right pericardiophrenic node (series 5/image 157), previously 1.6 cm, not appreciably changed. Short axis diameter 3.5 cm enlarged right paratracheal node (series 5/image 93), previously 4.6 cm using similar measurement technique, decreased. Enlarged 2.1 cm subcarinal node (series 5/image 116), decreased from 3.7 cm. Previously enlarged 1.4 cm left paratracheal node is decreased to 00.9 cm (series 5/image 96). Lungs/Pleura: No pneumothorax. Small dependent right pleural effusion, stable. No left pleural effusion. Poorly marginated central right upper lobe 6.3 x 3.3 cm lung mass (series 6/image 51), decreased from approximately 10.1 x 6.9 cm using similar measurement technique. Persistent patchy bandlike and nodular foci of consolidation throughout the peripheral right upper lobe, decreased. Interlobular septal thickening throughout the right lung is increased. No new significant pulmonary nodules. Upper abdomen: Numerous (greater than 10) small hypodense liver lesions scattered throughout the visualized liver,  decreased since 03/28/2020 PET-CT, for example measuring 1.2 cm in the segment 4A left liver lobe (series 4/image 74), previously 2.3 cm. Exophytic simple 3.4 cm lateral upper right renal cyst. Musculoskeletal: No aggressive appearing focal osseous lesions. Marked thoracic spondylosis. Review of the MIP images confirms the above findings. IMPRESSION: 1. Acute left-sided pulmonary embolism extending proximally to the distal left pulmonary artery. No saddle embolus. 2. Positive interval response to therapy. Decreased liver metastases. Decreased mediastinal lymphadenopathy. Poorly marginated  central right upper lobe lung mass is decreased. Decreased nonspecific peripheral right upper lobe patchy bandlike and nodular foci of consolidation compatible with a combination of postobstructive pneumonia, atelectasis and/or tumor. 3. Interlobular septal thickening throughout the right lung is increased, nonspecific, potentially asymmetric pulmonary edema, post treatment change and/or lymphangitic tumor, suggestive fashion on short-term follow-up chest CT. 4. Stable small dependent right pleural effusion. 5. Stable dilated main pulmonary artery, suggesting chronic pulmonary arterial hypertension. 6. Aortic Atherosclerosis (ICD10-I70.0). Electronically Signed: By: Delbert Phenix M.D. On: 05/15/2020 18:07   ECHOCARDIOGRAM COMPLETE  Result Date: 05/16/2020    ECHOCARDIOGRAM REPORT   Patient Name:   [facility] Date of Exam: 05/16/2020 Medical Rec #:  [id number]    Height:       69.0 in Accession #:    [id number]   Weight:       261.0 lb Date of Birth:  December 23, 1950    BSA:          2.314 m Patient Age:    68 years     BP:           127/93 mmHg Patient Gender: M            HR:           96 bpm. Exam Location:  Inpatient Procedure: 2D Echo, Cardiac Doppler and Color Doppler Indications:    Pulmonary Embolus 415.19 / I26.99  History:        Patient has no prior history of Echocardiogram examinations.                  Signs/Symptoms:Shortness of Breath; Risk Factors:Hypertension,                 Diabetes, Dyslipidemia and Former Smoker. Pulmonary embolism.  Sonographer:    Renella Cunas RDCS Referring Phys: [id number] [facility] IMPRESSIONS  1. Left ventricular ejection fraction, by estimation, is 55 to 60%. The left ventricle has normal function. The left ventricle has no regional wall motion abnormalities. There is mild left ventricular hypertrophy. Indeterminate diastolic filling due to E-A fusion.  2. Right ventricular systolic function is normal. The right ventricular size is normal. Tricuspid regurgitation signal is inadequate for assessing PA pressure.  3. The mitral valve is normal in structure. No evidence of mitral valve regurgitation. No evidence of mitral stenosis.  4. The aortic valve is tricuspid. Aortic valve regurgitation is not visualized. No aortic stenosis is present.  5. The inferior vena cava is dilated in size with >50% respiratory variability, suggesting right atrial pressure of 8 mmHg. FINDINGS  Left Ventricle: Left ventricular ejection fraction, by estimation, is 55 to 60%. The left ventricle has normal function. The left ventricle has no regional wall motion abnormalities. The left ventricular internal cavity size was normal in size. There is  mild left ventricular hypertrophy. Indeterminate diastolic filling due to E-A fusion. Right Ventricle: The right ventricular size is normal. No increase in right ventricular wall thickness. Right ventricular systolic function is normal. Tricuspid regurgitation signal is inadequate for assessing PA pressure. Left Atrium: Left atrial size was normal in size. Right Atrium: Right atrial size was normal in size. Pericardium: There is no evidence of pericardial effusion. Mitral Valve: The mitral valve is normal in structure. Mild mitral annular calcification. No evidence of mitral valve regurgitation. No evidence of mitral valve stenosis. Tricuspid Valve: The tricuspid  valve is normal in structure. Tricuspid valve regurgitation is not demonstrated. Aortic Valve: The aortic valve is tricuspid. Aortic valve  regurgitation is not visualized. No aortic stenosis is present. Pulmonic Valve: The pulmonic valve was normal in structure. Pulmonic valve regurgitation is not visualized. Aorta: The aortic root is normal in size and structure. Venous: The inferior vena cava is dilated in size with greater than 50% respiratory variability, suggesting right atrial pressure of 8 mmHg. IAS/Shunts: No atrial level shunt detected by color flow Doppler.  LEFT VENTRICLE PLAX 2D LVIDd:         4.60 cm LVIDs:         3.50 cm LV PW:         0.70 cm LV IVS:        0.70 cm LVOT diam:     2.00 cm LV SV:         51 LV SV Index:   22 LVOT Area:     3.14 cm  LV Volumes (MOD) LV vol d, MOD A2C: 114.0 ml LV vol d, MOD A4C: 143.0 ml LV vol s, MOD A2C: 54.6 ml LV vol s, MOD A4C: 59.8 ml LV SV MOD A2C:     59.4 ml LV SV MOD A4C:     143.0 ml LV SV MOD BP:      71.8 ml RIGHT VENTRICLE RV S prime:     13.40 cm/s TAPSE (M-mode): 2.0 cm LEFT ATRIUM             Index       RIGHT ATRIUM           Index LA diam:        3.10 cm 1.34 cm/m  RA Area:     11.40 cm LA Vol (A2C):   22.6 ml 9.77 ml/m  RA Volume:   21.20 ml  9.16 ml/m LA Vol (A4C):   30.2 ml 13.05 ml/m LA Biplane Vol: 26.3 ml 11.37 ml/m  AORTIC VALVE LVOT Vmax:   108.00 cm/s LVOT Vmean:  66.200 cm/s LVOT VTI:    0.163 m  AORTA Ao Root diam: 3.70 cm  SHUNTS Systemic VTI:  0.16 m Systemic Diam: 2.00 cm Loralie Champagne MD Electronically signed by Loralie Champagne MD Signature Date/Time: 05/16/2020/4:48:46 PM    Final      ASSESSMENT/PLAN:  This is a very pleasant 69 year old Caucasian male recently diagnosed with extensive stage (T4, N3, M1 C) small cell lung cancer presented with large right upper lobe lung mass in addition to mediastinal and right supraclavicular lymphadenopathy and multiple metastatic liver lesions diagnosed in April 2021.  The patient is  currently undergoing systemic chemotherapy with carboplatin for an AUC 5 on day 1, etoposide 100 mg/m2 on days 1, 2, and 3 in addition to Cosela for myeloprotection during the days of chemotherapy. He also will receive immunotherapy with Imfinzi on day 1 of chemotherapy cycle. He is status post 2 cycles.   The patient recently had a CT angiogram which noted improvement in his disease. He is scheduled for his CT of the abdomen and pelvis tomorrow. If there is anything concerning, we will see him while he is in infusion.   Dr. Julien Nordmann recommends continuing on the same treatment at the same dose. The patient will recieve cycle #3 as scheduled.  Labs were reviewed. His ANC is 1.2 today. We will add neulasta with this cycle of treatment for his neutropenia.   We will see him back for a follow up visit in 3 weeks for evaluation before staring cycle #4.   He will continue on eliquis for his  recent pulmonary embolism.   He will continue to take hycodan for his cough. He was advised to call us when he needs a refill.   The patient was advised to call immediately if she has any concerning symptoms in the interval. The patient voices understanding of current disease status and treatment options and is in agreement with the current care plan. All questions were answered. The patient knows to call the clinic with any problems, questions or concerns. We can certainly see the patient much sooner if necessary   No orders of the defined types were placed in this encounter.    Morningstar Toft L Bayyinah Dukeman, PA-C 06/04/20   ADDENDUM: Hematology/Oncology Attending: I had a face-to-face encounter with the patient today.  I recommended his care plan.  This is a very pleasant 69 years old white male with extensive stage small cell lung cancer currently on treatment with systemic chemotherapy with carboplatin, etoposide and Imfinzi in addition to [place] for Myeloprotection status post 2 cycles.  The patient has been  tolerating his treatment fairly well.  He missed the city of cycle #2 because of recent admission to the hospital with pulmonary embolism.  The patient is currently on treatment with Eliquis for the pulmonary embolism. He had CT angiogram of the chest performed recently as part of the evaluation of his shortness of breath at the time of diagnosis of the pulmonary embolism. The patient is also scheduled to have repeat CT scan of the abdomen pelvis tomorrow. His last CT scan of the chest showed improvement of his disease even after the first cycle of the treatment. I recommended for the patient to proceed with cycle #3 today as planned. For the chemotherapy-induced neutropenia, we will start the patient on Neulasta injection after this cycle. The patient will come back for follow-up visit in 3 weeks for evaluation before starting cycle #4. He was advised to call immediately if he has any concerning symptoms in the interval.  Disclaimer: This note was dictated with voice recognition software. Similar sounding words can inadvertently be transcribed and may be missed upon review. Eilleen Kempf, MD 06/04/20

## 2020-06-04 ENCOUNTER — Inpatient Hospital Stay: Payer: Medicare Other

## 2020-06-04 ENCOUNTER — Inpatient Hospital Stay (HOSPITAL_BASED_OUTPATIENT_CLINIC_OR_DEPARTMENT_OTHER): Payer: Medicare Other | Admitting: Physician Assistant

## 2020-06-04 ENCOUNTER — Other Ambulatory Visit: Payer: Self-pay

## 2020-06-04 ENCOUNTER — Encounter: Payer: Self-pay | Admitting: Physician Assistant

## 2020-06-04 VITALS — BP 128/85 | HR 92 | Temp 97.9°F | Resp 18 | Ht 69.0 in | Wt 264.5 lb

## 2020-06-04 DIAGNOSIS — C3411 Malignant neoplasm of upper lobe, right bronchus or lung: Secondary | ICD-10-CM

## 2020-06-04 DIAGNOSIS — I2692 Saddle embolus of pulmonary artery without acute cor pulmonale: Secondary | ICD-10-CM

## 2020-06-04 DIAGNOSIS — I10 Essential (primary) hypertension: Secondary | ICD-10-CM | POA: Diagnosis not present

## 2020-06-04 DIAGNOSIS — R5382 Chronic fatigue, unspecified: Secondary | ICD-10-CM

## 2020-06-04 DIAGNOSIS — Z5112 Encounter for antineoplastic immunotherapy: Secondary | ICD-10-CM

## 2020-06-04 DIAGNOSIS — Z5111 Encounter for antineoplastic chemotherapy: Secondary | ICD-10-CM | POA: Diagnosis not present

## 2020-06-04 LAB — CBC WITH DIFFERENTIAL (CANCER CENTER ONLY)
Abs Immature Granulocytes: 0.05 10*3/uL (ref 0.00–0.07)
Basophils Absolute: 0 10*3/uL (ref 0.0–0.1)
Basophils Relative: 1 %
Eosinophils Absolute: 0.1 10*3/uL (ref 0.0–0.5)
Eosinophils Relative: 3 %
HCT: 36.2 % — ABNORMAL LOW (ref 39.0–52.0)
Hemoglobin: 11.7 g/dL — ABNORMAL LOW (ref 13.0–17.0)
Immature Granulocytes: 2 %
Lymphocytes Relative: 25 %
Lymphs Abs: 0.5 10*3/uL — ABNORMAL LOW (ref 0.7–4.0)
MCH: 29.8 pg (ref 26.0–34.0)
MCHC: 32.3 g/dL (ref 30.0–36.0)
MCV: 92.1 fL (ref 80.0–100.0)
Monocytes Absolute: 0.3 10*3/uL (ref 0.1–1.0)
Monocytes Relative: 13 %
Neutro Abs: 1.2 10*3/uL — ABNORMAL LOW (ref 1.7–7.7)
Neutrophils Relative %: 56 %
Platelet Count: 156 10*3/uL (ref 150–400)
RBC: 3.93 MIL/uL — ABNORMAL LOW (ref 4.22–5.81)
RDW: 16.2 % — ABNORMAL HIGH (ref 11.5–15.5)
WBC Count: 2.2 10*3/uL — ABNORMAL LOW (ref 4.0–10.5)
nRBC: 0 % (ref 0.0–0.2)

## 2020-06-04 LAB — CMP (CANCER CENTER ONLY)
ALT: 18 U/L (ref 0–44)
AST: 16 U/L (ref 15–41)
Albumin: 3.4 g/dL — ABNORMAL LOW (ref 3.5–5.0)
Alkaline Phosphatase: 80 U/L (ref 38–126)
Anion gap: 10 (ref 5–15)
BUN: 15 mg/dL (ref 8–23)
CO2: 27 mmol/L (ref 22–32)
Calcium: 8.5 mg/dL — ABNORMAL LOW (ref 8.9–10.3)
Chloride: 103 mmol/L (ref 98–111)
Creatinine: 1.09 mg/dL (ref 0.61–1.24)
GFR, Est AFR Am: >60 mL/min (ref >60)
GFR, Estimated: >60 mL/min (ref >60)
Glucose, Bld: 216 mg/dL — ABNORMAL HIGH (ref 70–99)
Potassium: 4.3 mmol/L (ref 3.5–5.1)
Sodium: 140 mmol/L (ref 135–145)
Total Bilirubin: 0.3 mg/dL (ref 0.3–1.2)
Total Protein: 6.2 g/dL — ABNORMAL LOW (ref 6.5–8.1)

## 2020-06-04 LAB — TSH: TSH: 0.752 u[IU]/mL (ref 0.320–4.118)

## 2020-06-04 MED ORDER — SODIUM CHLORIDE 0.9 % IV SOLN
659.5000 mg | Freq: Once | INTRAVENOUS | Status: AC
  Administered 2020-06-04: 659.5 mg via INTRAVENOUS
  Filled 2020-06-04: qty 65.95

## 2020-06-04 MED ORDER — SODIUM CHLORIDE 0.9 % IV SOLN
Freq: Once | INTRAVENOUS | Status: AC
  Filled 2020-06-04: qty 250

## 2020-06-04 MED ORDER — PALONOSETRON HCL INJECTION 0.25 MG/5ML
INTRAVENOUS | Status: AC
  Filled 2020-06-04: qty 5

## 2020-06-04 MED ORDER — SODIUM CHLORIDE 0.9 % IV SOLN
10.0000 mg | Freq: Once | INTRAVENOUS | Status: AC
  Administered 2020-06-04: 10 mg via INTRAVENOUS
  Filled 2020-06-04: qty 10

## 2020-06-04 MED ORDER — SODIUM CHLORIDE 0.9% FLUSH
10.0000 mL | INTRAVENOUS | Status: DC | PRN
  Administered 2020-06-04: 10 mL
  Filled 2020-06-04: qty 10

## 2020-06-04 MED ORDER — SODIUM CHLORIDE 0.9 % IV SOLN
150.0000 mg | Freq: Once | INTRAVENOUS | Status: AC
  Administered 2020-06-04: 150 mg via INTRAVENOUS
  Filled 2020-06-04: qty 150

## 2020-06-04 MED ORDER — SODIUM CHLORIDE 0.9 % IV SOLN
100.0000 mg/m2 | Freq: Once | INTRAVENOUS | Status: AC
  Administered 2020-06-04: 240 mg via INTRAVENOUS
  Filled 2020-06-04: qty 12

## 2020-06-04 MED ORDER — HEPARIN SOD (PORK) LOCK FLUSH 100 UNIT/ML IV SOLN
500.0000 [IU] | Freq: Once | INTRAVENOUS | Status: AC | PRN
  Administered 2020-06-04: 500 [IU]
  Filled 2020-06-04: qty 5

## 2020-06-04 MED ORDER — SODIUM CHLORIDE 0.9 % IV SOLN
1500.0000 mg | Freq: Once | INTRAVENOUS | Status: AC
  Administered 2020-06-04: 1500 mg via INTRAVENOUS
  Filled 2020-06-04: qty 30

## 2020-06-04 MED ORDER — PALONOSETRON HCL INJECTION 0.25 MG/5ML
0.2500 mg | Freq: Once | INTRAVENOUS | Status: AC
  Administered 2020-06-04: 0.25 mg via INTRAVENOUS

## 2020-06-04 MED ORDER — TRILACICLIB DIHYDROCHLORIDE INJECTION 300 MG
240.0000 mg/m2 | Freq: Once | INTRAVENOUS | Status: AC
  Administered 2020-06-04: 600 mg via INTRAVENOUS
  Filled 2020-06-04: qty 40

## 2020-06-04 NOTE — Progress Notes (Signed)
Per Cassandra H., PA, OK to treat with today's ANC. Pt will be receiving neulasta this week.

## 2020-06-04 NOTE — Patient Instructions (Signed)
[facility] Discharge Instructions for Patients Receiving Chemotherapy  Today you received the following chemotherapy agents: Imfinzi, Carboplatin, etoposide   To help prevent nausea and vomiting after your treatment, we encourage you to take your nausea medication as directed.    If you develop nausea and vomiting that is not controlled by your nausea medication, call the clinic.   BELOW ARE SYMPTOMS THAT SHOULD BE REPORTED IMMEDIATELY:  *FEVER GREATER THAN 100.5 F  *CHILLS WITH OR WITHOUT FEVER  NAUSEA AND VOMITING THAT IS NOT CONTROLLED WITH YOUR NAUSEA MEDICATION  *UNUSUAL SHORTNESS OF BREATH  *UNUSUAL BRUISING OR BLEEDING  TENDERNESS IN MOUTH AND THROAT WITH OR WITHOUT PRESENCE OF ULCERS  *URINARY PROBLEMS  *BOWEL PROBLEMS  UNUSUAL RASH Items with * indicate a potential emergency and should be followed up as soon as possible.  Feel free to call the clinic should you have any questions or concerns. The clinic phone number is [phone].  Please show the [facility] at check-in to the Emergency Department and triage nurse.

## 2020-06-05 ENCOUNTER — Other Ambulatory Visit: Payer: Self-pay

## 2020-06-05 ENCOUNTER — Ambulatory Visit (HOSPITAL_COMMUNITY)
Admission: RE | Admit: 2020-06-05 | Discharge: 2020-06-05 | Disposition: A | Discharge status: Home / Self Care | Payer: Medicare Other | Source: Ambulatory Visit | Attending: Internal Medicine | Admitting: Internal Medicine

## 2020-06-05 ENCOUNTER — Inpatient Hospital Stay: Payer: Medicare Other

## 2020-06-05 VITALS — BP 119/75 | HR 100 | Temp 98.0°F | Resp 18

## 2020-06-05 DIAGNOSIS — C3490 Malignant neoplasm of unspecified part of unspecified bronchus or lung: Secondary | ICD-10-CM | POA: Diagnosis not present

## 2020-06-05 DIAGNOSIS — Z5112 Encounter for antineoplastic immunotherapy: Secondary | ICD-10-CM | POA: Diagnosis not present

## 2020-06-05 DIAGNOSIS — C3411 Malignant neoplasm of upper lobe, right bronchus or lung: Secondary | ICD-10-CM

## 2020-06-05 MED ORDER — SODIUM CHLORIDE 0.9 % IV SOLN
10.0000 mg | Freq: Once | INTRAVENOUS | Status: AC
  Administered 2020-06-05: 10 mg via INTRAVENOUS
  Filled 2020-06-05: qty 10

## 2020-06-05 MED ORDER — IOHEXOL 300 MG/ML  SOLN
100.0000 mL | Freq: Once | INTRAMUSCULAR | Status: AC
  Administered 2020-06-05: 100 mL via INTRAVENOUS

## 2020-06-05 MED ORDER — SODIUM CHLORIDE 0.9 % IV SOLN
100.0000 mg/m2 | Freq: Once | INTRAVENOUS | Status: AC
  Administered 2020-06-05: 240 mg via INTRAVENOUS
  Filled 2020-06-05: qty 12

## 2020-06-05 MED ORDER — SODIUM CHLORIDE (PF) 0.9 % IJ SOLN
INTRAMUSCULAR | Status: AC
  Filled 2020-06-05: qty 50

## 2020-06-05 MED ORDER — SODIUM CHLORIDE 0.9% FLUSH
10.0000 mL | INTRAVENOUS | Status: DC | PRN
  Administered 2020-06-05: 10 mL
  Filled 2020-06-05: qty 10

## 2020-06-05 MED ORDER — HEPARIN SOD (PORK) LOCK FLUSH 100 UNIT/ML IV SOLN
500.0000 [IU] | Freq: Once | INTRAVENOUS | Status: AC | PRN
  Administered 2020-06-05: 500 [IU]
  Filled 2020-06-05: qty 5

## 2020-06-05 MED ORDER — HEPARIN SOD (PORK) LOCK FLUSH 100 UNIT/ML IV SOLN
INTRAVENOUS | Status: AC
  Administered 2020-06-05: 500 [IU] via INTRAVENOUS
  Filled 2020-06-05: qty 5

## 2020-06-05 MED ORDER — HEPARIN SOD (PORK) LOCK FLUSH 100 UNIT/ML IV SOLN: 500.0000 [IU] | Freq: Once | INTRAVENOUS | Status: AC

## 2020-06-05 MED ORDER — TRILACICLIB DIHYDROCHLORIDE INJECTION 300 MG
240.0000 mg/m2 | Freq: Once | INTRAVENOUS | Status: AC
  Administered 2020-06-05: 600 mg via INTRAVENOUS
  Filled 2020-06-05: qty 40

## 2020-06-05 MED ORDER — SODIUM CHLORIDE 0.9 % IV SOLN
Freq: Once | INTRAVENOUS | Status: AC
  Filled 2020-06-05: qty 250

## 2020-06-05 NOTE — Patient Instructions (Signed)
[facility] Discharge Instructions for Patients Receiving Chemotherapy  Today you received the following chemotherapy agents Etoposide  To help prevent nausea and vomiting after your treatment, we encourage you to take your nausea medication as directed   If you develop nausea and vomiting that is not controlled by your nausea medication, call the clinic.   BELOW ARE SYMPTOMS THAT SHOULD BE REPORTED IMMEDIATELY:  *FEVER GREATER THAN 100.5 F  *CHILLS WITH OR WITHOUT FEVER  NAUSEA AND VOMITING THAT IS NOT CONTROLLED WITH YOUR NAUSEA MEDICATION  *UNUSUAL SHORTNESS OF BREATH  *UNUSUAL BRUISING OR BLEEDING  TENDERNESS IN MOUTH AND THROAT WITH OR WITHOUT PRESENCE OF ULCERS  *URINARY PROBLEMS  *BOWEL PROBLEMS  UNUSUAL RASH Items with * indicate a potential emergency and should be followed up as soon as possible.  Feel free to call the clinic should you have any questions or concerns. The clinic phone number is [phone].  Please show the [facility] at check-in to the Emergency Department and triage nurse.

## 2020-06-06 ENCOUNTER — Other Ambulatory Visit: Payer: Self-pay

## 2020-06-06 ENCOUNTER — Inpatient Hospital Stay: Payer: Medicare Other

## 2020-06-06 VITALS — BP 158/88 | HR 99 | Temp 98.4°F | Resp 16

## 2020-06-06 DIAGNOSIS — Z5112 Encounter for antineoplastic immunotherapy: Secondary | ICD-10-CM | POA: Diagnosis not present

## 2020-06-06 DIAGNOSIS — C3411 Malignant neoplasm of upper lobe, right bronchus or lung: Secondary | ICD-10-CM

## 2020-06-06 MED ORDER — SODIUM CHLORIDE 0.9 % IV SOLN
Freq: Once | INTRAVENOUS | Status: AC
  Filled 2020-06-06: qty 250

## 2020-06-06 MED ORDER — SODIUM CHLORIDE 0.9% FLUSH
10.0000 mL | INTRAVENOUS | Status: DC | PRN
  Administered 2020-06-06: 10 mL
  Filled 2020-06-06: qty 10

## 2020-06-06 MED ORDER — TRILACICLIB DIHYDROCHLORIDE INJECTION 300 MG
240.0000 mg/m2 | Freq: Once | INTRAVENOUS | Status: AC
  Administered 2020-06-06: 600 mg via INTRAVENOUS
  Filled 2020-06-06: qty 40

## 2020-06-06 MED ORDER — SODIUM CHLORIDE 0.9 % IV SOLN
10.0000 mg | Freq: Once | INTRAVENOUS | Status: AC
  Administered 2020-06-06: 10 mg via INTRAVENOUS
  Filled 2020-06-06: qty 10

## 2020-06-06 MED ORDER — SODIUM CHLORIDE 0.9 % IV SOLN
100.0000 mg/m2 | Freq: Once | INTRAVENOUS | Status: AC
  Administered 2020-06-06: 240 mg via INTRAVENOUS
  Filled 2020-06-06: qty 12

## 2020-06-06 MED ORDER — HEPARIN SOD (PORK) LOCK FLUSH 100 UNIT/ML IV SOLN
500.0000 [IU] | Freq: Once | INTRAVENOUS | Status: AC | PRN
  Administered 2020-06-06: 500 [IU]
  Filled 2020-06-06: qty 5

## 2020-06-07 ENCOUNTER — Telehealth: Payer: Self-pay | Admitting: Physician Assistant

## 2020-06-07 NOTE — Telephone Encounter (Signed)
Scheduled per los. Called and left msg. Mailed printout  °

## 2020-06-08 ENCOUNTER — Other Ambulatory Visit: Payer: Self-pay | Admitting: Physician Assistant

## 2020-06-08 ENCOUNTER — Inpatient Hospital Stay: Payer: Medicare Other

## 2020-06-08 ENCOUNTER — Encounter: Payer: Self-pay | Admitting: Internal Medicine

## 2020-06-08 ENCOUNTER — Other Ambulatory Visit: Payer: Self-pay

## 2020-06-08 VITALS — BP 138/64 | HR 71 | Resp 18

## 2020-06-08 DIAGNOSIS — C3411 Malignant neoplasm of upper lobe, right bronchus or lung: Secondary | ICD-10-CM

## 2020-06-08 DIAGNOSIS — Z5112 Encounter for antineoplastic immunotherapy: Secondary | ICD-10-CM | POA: Diagnosis not present

## 2020-06-08 DIAGNOSIS — I2692 Saddle embolus of pulmonary artery without acute cor pulmonale: Secondary | ICD-10-CM

## 2020-06-08 MED ORDER — PEGFILGRASTIM-JMDB 6 MG/0.6ML ~~LOC~~ SOSY
PREFILLED_SYRINGE | SUBCUTANEOUS | Status: AC
  Filled 2020-06-08: qty 0.6

## 2020-06-08 MED ORDER — PEGFILGRASTIM-JMDB 6 MG/0.6ML ~~LOC~~ SOSY
6.0000 mg | PREFILLED_SYRINGE | Freq: Once | SUBCUTANEOUS | Status: AC
  Administered 2020-06-08: 6 mg via SUBCUTANEOUS

## 2020-06-08 MED ORDER — APIXABAN 5 MG PO TABS: 5.0000 mg | ORAL_TABLET | Freq: Two times a day (BID) | ORAL | 2 refills | Status: DC

## 2020-06-08 NOTE — Patient Instructions (Signed)

## 2020-06-11 ENCOUNTER — Other Ambulatory Visit: Payer: Self-pay

## 2020-06-11 ENCOUNTER — Inpatient Hospital Stay: Payer: Medicare Other

## 2020-06-11 DIAGNOSIS — Z95828 Presence of other vascular implants and grafts: Secondary | ICD-10-CM | POA: Insufficient documentation

## 2020-06-11 DIAGNOSIS — C3411 Malignant neoplasm of upper lobe, right bronchus or lung: Secondary | ICD-10-CM

## 2020-06-11 DIAGNOSIS — Z5112 Encounter for antineoplastic immunotherapy: Secondary | ICD-10-CM | POA: Diagnosis not present

## 2020-06-11 LAB — CMP (CANCER CENTER ONLY)
ALT: 16 U/L (ref 0–44)
AST: 13 U/L — ABNORMAL LOW (ref 15–41)
Albumin: 3.4 g/dL — ABNORMAL LOW (ref 3.5–5.0)
Alkaline Phosphatase: 90 U/L (ref 38–126)
Anion gap: 9 (ref 5–15)
BUN: 16 mg/dL (ref 8–23)
CO2: 27 mmol/L (ref 22–32)
Calcium: 8.7 mg/dL — ABNORMAL LOW (ref 8.9–10.3)
Chloride: 103 mmol/L (ref 98–111)
Creatinine: 1.09 mg/dL (ref 0.61–1.24)
GFR, Est AFR Am: >60 mL/min (ref >60)
GFR, Estimated: >60 mL/min (ref >60)
Glucose, Bld: 252 mg/dL — ABNORMAL HIGH (ref 70–99)
Potassium: 4.2 mmol/L (ref 3.5–5.1)
Sodium: 139 mmol/L (ref 135–145)
Total Bilirubin: 0.2 mg/dL — ABNORMAL LOW (ref 0.3–1.2)
Total Protein: 5.8 g/dL — ABNORMAL LOW (ref 6.5–8.1)

## 2020-06-11 LAB — CBC WITH DIFFERENTIAL (CANCER CENTER ONLY)
Abs Immature Granulocytes: 0.24 10*3/uL — ABNORMAL HIGH (ref 0.00–0.07)
Basophils Absolute: 0 10*3/uL (ref 0.0–0.1)
Basophils Relative: 0 %
Eosinophils Absolute: 0.2 10*3/uL (ref 0.0–0.5)
Eosinophils Relative: 2 %
HCT: 32.9 % — ABNORMAL LOW (ref 39.0–52.0)
Hemoglobin: 10.7 g/dL — ABNORMAL LOW (ref 13.0–17.0)
Immature Granulocytes: 3 %
Lymphocytes Relative: 6 %
Lymphs Abs: 0.4 10*3/uL — ABNORMAL LOW (ref 0.7–4.0)
MCH: 29.5 pg (ref 26.0–34.0)
MCHC: 32.5 g/dL (ref 30.0–36.0)
MCV: 90.6 fL (ref 80.0–100.0)
Monocytes Absolute: 0.6 10*3/uL (ref 0.1–1.0)
Monocytes Relative: 8 %
Neutro Abs: 5.9 10*3/uL (ref 1.7–7.7)
Neutrophils Relative %: 81 %
Platelet Count: 241 10*3/uL (ref 150–400)
RBC: 3.63 MIL/uL — ABNORMAL LOW (ref 4.22–5.81)
RDW: 15.8 % — ABNORMAL HIGH (ref 11.5–15.5)
WBC Count: 7.3 10*3/uL (ref 4.0–10.5)
nRBC: 0 % (ref 0.0–0.2)

## 2020-06-11 MED ORDER — SODIUM CHLORIDE 0.9% FLUSH
10.0000 mL | Freq: Once | INTRAVENOUS | Status: AC
  Administered 2020-06-11: 10 mL
  Filled 2020-06-11: qty 10

## 2020-06-18 NOTE — Progress Notes (Signed)
  Radiation Oncology         [phone] ________________________________  Name: Adam Wise MRN: [id number]  Date: 05/02/2020  DOB: September 07, 1951  End of Treatment Note  Diagnosis:  Lung cancer     Indication for treatment::  palliative       Radiation treatment dates:   04/12/20 - 05/02/20  Site/dose:   The patient was treated to the disease within the right lung initially to a dose of 37.5 Gy using a 3 field, 3-D conformal technique. .   Narrative: The patient tolerated radiation treatment relatively well.      Plan: The patient has completed radiation treatment. The patient will return to radiation oncology clinic for routine followup in one month. I advised the patient to call or return sooner if they have any questions or concerns related to their recovery or treatment. ________________________________  Jodelle Gross, M.D., Ph.D.

## 2020-06-19 ENCOUNTER — Other Ambulatory Visit: Payer: Self-pay

## 2020-06-19 ENCOUNTER — Other Ambulatory Visit: Payer: Self-pay | Admitting: Radiation Oncology

## 2020-06-19 ENCOUNTER — Inpatient Hospital Stay: Payer: Medicare Other

## 2020-06-19 ENCOUNTER — Inpatient Hospital Stay: Payer: Medicare Other | Attending: Internal Medicine

## 2020-06-19 DIAGNOSIS — Z86711 Personal history of pulmonary embolism: Secondary | ICD-10-CM | POA: Diagnosis not present

## 2020-06-19 DIAGNOSIS — Z5111 Encounter for antineoplastic chemotherapy: Secondary | ICD-10-CM | POA: Insufficient documentation

## 2020-06-19 DIAGNOSIS — Z5189 Encounter for other specified aftercare: Secondary | ICD-10-CM | POA: Insufficient documentation

## 2020-06-19 DIAGNOSIS — Z7901 Long term (current) use of anticoagulants: Secondary | ICD-10-CM | POA: Diagnosis not present

## 2020-06-19 DIAGNOSIS — Z452 Encounter for adjustment and management of vascular access device: Secondary | ICD-10-CM | POA: Insufficient documentation

## 2020-06-19 DIAGNOSIS — Z5112 Encounter for antineoplastic immunotherapy: Secondary | ICD-10-CM | POA: Diagnosis not present

## 2020-06-19 DIAGNOSIS — Z79899 Other long term (current) drug therapy: Secondary | ICD-10-CM | POA: Diagnosis not present

## 2020-06-19 DIAGNOSIS — Z95828 Presence of other vascular implants and grafts: Secondary | ICD-10-CM

## 2020-06-19 DIAGNOSIS — C3411 Malignant neoplasm of upper lobe, right bronchus or lung: Secondary | ICD-10-CM | POA: Insufficient documentation

## 2020-06-19 DIAGNOSIS — C787 Secondary malignant neoplasm of liver and intrahepatic bile duct: Secondary | ICD-10-CM | POA: Diagnosis not present

## 2020-06-19 LAB — CMP (CANCER CENTER ONLY)
ALT: 18 U/L (ref 0–44)
AST: 16 U/L (ref 15–41)
Albumin: 3.7 g/dL (ref 3.5–5.0)
Alkaline Phosphatase: 107 U/L (ref 38–126)
Anion gap: 9 (ref 5–15)
BUN: 14 mg/dL (ref 8–23)
CO2: 26 mmol/L (ref 22–32)
Calcium: 8.8 mg/dL — ABNORMAL LOW (ref 8.9–10.3)
Chloride: 104 mmol/L (ref 98–111)
Creatinine: 1.14 mg/dL (ref 0.61–1.24)
GFR, Est AFR Am: >60 mL/min (ref >60)
GFR, Estimated: >60 mL/min (ref >60)
Glucose, Bld: 211 mg/dL — ABNORMAL HIGH (ref 70–99)
Potassium: 4.5 mmol/L (ref 3.5–5.1)
Sodium: 139 mmol/L (ref 135–145)
Total Bilirubin: 0.3 mg/dL (ref 0.3–1.2)
Total Protein: 6.3 g/dL — ABNORMAL LOW (ref 6.5–8.1)

## 2020-06-19 LAB — CBC WITH DIFFERENTIAL (CANCER CENTER ONLY)
Abs Immature Granulocytes: 0.88 10*3/uL — ABNORMAL HIGH (ref 0.00–0.07)
Basophils Absolute: 0 10*3/uL (ref 0.0–0.1)
Basophils Relative: 0 %
Eosinophils Absolute: 0 10*3/uL (ref 0.0–0.5)
Eosinophils Relative: 0 %
HCT: 34.3 % — ABNORMAL LOW (ref 39.0–52.0)
Hemoglobin: 11.2 g/dL — ABNORMAL LOW (ref 13.0–17.0)
Immature Granulocytes: 7 %
Lymphocytes Relative: 6 %
Lymphs Abs: 0.7 10*3/uL (ref 0.7–4.0)
MCH: 30.2 pg (ref 26.0–34.0)
MCHC: 32.7 g/dL (ref 30.0–36.0)
MCV: 92.5 fL (ref 80.0–100.0)
Monocytes Absolute: 0.8 10*3/uL (ref 0.1–1.0)
Monocytes Relative: 7 %
Neutro Abs: 9.6 10*3/uL — ABNORMAL HIGH (ref 1.7–7.7)
Neutrophils Relative %: 80 %
Platelet Count: 64 10*3/uL — ABNORMAL LOW (ref 150–400)
RBC: 3.71 MIL/uL — ABNORMAL LOW (ref 4.22–5.81)
RDW: 17.6 % — ABNORMAL HIGH (ref 11.5–15.5)
WBC Count: 12 10*3/uL — ABNORMAL HIGH (ref 4.0–10.5)
nRBC: 0.8 % — ABNORMAL HIGH (ref 0.0–0.2)

## 2020-06-19 MED ORDER — HEPARIN SOD (PORK) LOCK FLUSH 100 UNIT/ML IV SOLN
500.0000 [IU] | Freq: Once | INTRAVENOUS | Status: AC
  Administered 2020-06-19: 500 [IU]
  Filled 2020-06-19: qty 5

## 2020-06-19 MED ORDER — SODIUM CHLORIDE 0.9% FLUSH
10.0000 mL | Freq: Once | INTRAVENOUS | Status: AC
  Administered 2020-06-19: 10 mL
  Filled 2020-06-19: qty 10

## 2020-06-20 NOTE — Progress Notes (Signed)
Pharmacist Chemotherapy Monitoring - Follow Up Assessment    I verify that I have reviewed each item in the below checklist:  . Regimen for the patient is scheduled for the appropriate day and plan matches scheduled date. Marland Kitchen Appropriate non-routine labs are ordered dependent on drug ordered. . If applicable, additional medications reviewed and ordered per protocol based on lifetime cumulative doses and/or treatment regimen.   Plan for follow-up and/or issues identified: No . I-vent associated with next due treatment: No . MD and/or nursing notified: No  Philomena Course 06/20/2020 11:06 AM

## 2020-06-24 NOTE — Progress Notes (Signed)
[name], NP [place] [address]  DIAGNOSIS: Extensive stage (T4, N3, M1c)small cell lung cancer diagnosed in April 2021 and presented with large right upper lobe/right hilar mass with ipsilateral and contralateral mediastinal and right supraclavicular lymphadenopathy in addition to multiple liver lesions  PRIOR THERAPY: Palliative radiotherapy to the right upper lobe lung mass under the care of Dr. Lisbeth Renshaw  CURRENT THERAPY:  Systemic chemotherapy with carboplatin for AUC of 5 on day 1, etoposide 100 mg/M2 on days 1, 2 and 3 in addition to Imfinzi 1500 mg IV every 3 weeks with chemotherapy then every 4 weeks for maintenance if the patient has no evidence for progression. He will also receive Cosela 240 mg/m2 on the days of the chemotherapy. Status post 3 cycles. He will receive neulasta for cycle #3 due to neutropenia.   INTERVAL HISTORY: Adam Wise 69 y.o. male returns to the clinic for a follow up visit accompanied by his wife. The patient is feeling well today without any concerning complaints. The patient continues to tolerate treatment with chemotherapy well without any adverse side effects except he received neulasta with the last cycle and had some arthralgias. He recently had a restaging scan which noted an interval response to treatment. Denies any fever, chills, night sweats, or weight loss. Denies any chest pain or hemoptysis. His dyspnea on exertion is improved compared to prior. His cough has improved. He was able to mow the lawn recently without any concerns. Denies any nausea, vomiting, diarrhea, or constipation. Denies any headache or visual changes. Denies any rashes or skin changes. The patient is here today for evaluation prior to starting cycle # 4   MEDICAL HISTORY: Past Medical History:  Diagnosis Date   Chickenpox    Chronic knee pain    Chronic low back pain    Essential hypertension     GERD (gastroesophageal reflux disease)    OSA (obstructive sleep apnea)    Testosterone deficiency    Type 2 diabetes mellitus (HCC)     ALLERGIES:  is allergic to bupropion.  MEDICATIONS:  Current Outpatient Medications  Medication Sig Dispense Refill   acetaminophen (TYLENOL) 500 MG tablet Take 1,500 mg by mouth every 8 (eight) hours as needed for moderate pain or headache.     albuterol (VENTOLIN HFA) 108 (90 Base) MCG/ACT inhaler Inhale 2 puffs into the lungs every 6 (six) hours as needed for wheezing or shortness of breath. 18 g 0   amLODipine (NORVASC) 10 MG tablet TAKE 1 TABLET BY MOUTH EVERY DAY (Patient taking differently: Take 10 mg by mouth daily. ) 90 tablet 1   apixaban (ELIQUIS) 5 MG TABS tablet Take 1 tablet (5 mg total) by mouth 2 (two) times daily. 60 tablet 2   APIXABAN (ELIQUIS) VTE STARTER PACK ('10MG'$  AND '5MG'$ ) Take as directed on package: start with two-'5mg'$  tablets twice daily for 7 days. On day 8, switch to one-'5mg'$  tablet twice daily. 1 each 0   Black Pepper-Turmeric (TURMERIC COMPLEX/BLACK PEPPER PO) Take 1 tablet by mouth in the morning and at bedtime.     blood glucose meter kit and supplies KIT Dispense based on patient and insurance preference. Use up to four times daily as directed. 1 each 0   chlorpheniramine-HYDROcodone (TUSSIONEX) 10-8 MG/5ML SUER Take 5 mLs by mouth at bedtime as needed for cough. 140 mL 0   Coenzyme Q10 ([employer] COQ-10) 200 MG capsule Take 200 mg by mouth  every evening.      esomeprazole (NEXIUM) 20 MG capsule Take 20 mg by mouth daily.      fluticasone (FLONASE) 50 MCG/ACT nasal spray Place 2 sprays into both nostrils daily as needed for allergies.      gabapentin (NEURONTIN) 300 MG capsule Take 300 mg by mouth 2 (two) times daily.      glipiZIDE (GLUCOTROL) 10 MG tablet TAKE 1 TABLET (10 MG TOTAL) BY MOUTH 2 (TWO) TIMES DAILY BEFORE A MEAL. FOR DIABETES. 180 tablet 2   KRILL OIL PO Take 350 mg by mouth daily.     Lancets  MISC USE UP TO 3 TIMES DAILY AS DIRECTED 100 each 2   lidocaine-prilocaine (EMLA) cream Apply 1 application topically as needed. (Patient taking differently: Apply 1 application topically as needed (access port). ) 30 g 0   lisinopril (ZESTRIL) 5 MG tablet TAKE 1 TABLET BY MOUTH EVERY DAY (Patient taking differently: Take 5 mg by mouth daily. ) 90 tablet 1   Melatonin 10 MG CAPS Take 10 mg by mouth at bedtime.     metFORMIN (GLUCOPHAGE) 1000 MG tablet TAKE 1 TABLET (1,000 MG TOTAL) BY MOUTH 2 (TWO) TIMES DAILY WITH A MEAL. 180 tablet 1   Multiple Vitamin (MULTI-VITAMINS) TABS Take 1 tablet by mouth daily.      ONETOUCH ULTRA test strip USE UP TO 4 TIMES DAILY AS DIRECTED 100 strip 5   oxymetazoline (AFRIN) 0.05 % nasal spray Place 1 spray into both nostrils 2 (two) times daily as needed for congestion.     pioglitazone (ACTOS) 45 MG tablet TAKE 1 TABLET BY MOUTH EVERY DAY (Patient taking differently: Take 45 mg by mouth daily. ) 90 tablet 1   pravastatin (PRAVACHOL) 40 MG tablet TAKE 1 TABLET BY MOUTH EVERY DAY IN THE EVENING FOR CHOLESTEROL (Patient taking differently: Take 40 mg by mouth daily. ) 90 tablet 1   prochlorperazine (COMPAZINE) 10 MG tablet Take 1 tablet (10 mg total) by mouth every 6 (six) hours as needed for nausea or vomiting. 30 tablet 0   sildenafil (VIAGRA) 50 MG tablet TAKE 1 TABLET BY MOUTH EVERY DAY AS NEEDED 12 tablet 1   sucralfate (CARAFATE) 1 g tablet DISSOLVE ONE TAB IN 15ML'S OF WATER AND TAKE BY MOUTH FOUR TIMES A DAY AS DIRECTED 120 tablet 0   SYRINGE-NEEDLE, DISP, 3 ML (B-D 3CC LUER-LOK SYR 22GX1") 22G X 1" 3 ML MISC USE AS INSTRUCTED FOR TESTOSTERONE INJECTION EVERY 2 WEEKS 10 each 2   testosterone cypionate (DEPOTESTOSTERONE CYPIONATE) 200 MG/ML injection Inject 200 mg into the muscle every 14 (fourteen) days.     amoxicillin-clavulanate (AUGMENTIN) 875-125 MG tablet Take 1 tablet by mouth 2 (two) times daily. (Patient not taking: Reported on 06/26/2020)  20 tablet 0   HYDROcodone-homatropine (HYCODAN) 5-1.5 MG/5ML syrup Take 5 mLs by mouth at bedtime as needed for cough. (Patient not taking: Reported on 06/04/2020)     No current facility-administered medications for this visit.    SURGICAL HISTORY:  Past Surgical History:  Procedure Laterality Date   COLONOSCOPY WITH PROPOFOL N/A 12/17/2018   Procedure: COLONOSCOPY WITH PROPOFOL;  Surgeon: Pasty Spillers, MD;  Location: [facility] ENDOSCOPY;  Service: Gastroenterology;  Laterality: N/A;   IR IMAGING GUIDED PORT INSERTION  04/17/2020   JOINT REPLACEMENT Bilateral    REPLACEMENT TOTAL KNEE BILATERAL  2015   TONSILLECTOMY  1960    REVIEW OF SYSTEMS:   Review of Systems  Constitutional: Negative for appetite change, chills, fatigue,  fever and unexpected weight change.  HENT: Negative for mouth sores, nosebleeds, sore throat and trouble swallowing.   Eyes: Negative for eye problems and icterus.  Respiratory: Negative for cough, hemoptysis, shortness of breath and wheezing.   Cardiovascular: Negative for chest pain and leg swelling.  Gastrointestinal: Negative for abdominal pain, constipation, diarrhea, nausea and vomiting.  Genitourinary: Negative for bladder incontinence, difficulty urinating, dysuria, frequency and hematuria.   Musculoskeletal: Negative for back pain, gait problem, neck pain and neck stiffness.  Skin: Negative for itching and rash.  Neurological: Negative for dizziness, extremity weakness, gait problem, headaches, light-headedness and seizures.  Hematological: Negative for adenopathy. Does not bruise/bleed easily.  Psychiatric/Behavioral: Negative for confusion, depression and sleep disturbance. The patient is not nervous/anxious.     PHYSICAL EXAMINATION:  Blood pressure 126/71, pulse 86, temperature 97.6 F (36.4 C), temperature source Temporal, resp. rate 17, height '5\' 9"'$  (1.753 m), weight 265 lb (120.2 kg), SpO2 99 %.  ECOG PERFORMANCE STATUS: 1 - Symptomatic  but completely ambulatory  Physical Exam  Constitutional: Oriented to person, place, and time and well-developed, well-nourished, and in no distress. HENT:  Head: Normocephalic and atraumatic.  Mouth/Throat: Oropharynx is clear and moist. No oropharyngeal exudate.  Eyes: Conjunctivae are normal. Right eye exhibits no discharge. Left eye exhibits no discharge. No scleral icterus.  Neck: Normal range of motion. Neck supple.  Cardiovascular: Normal rate, regular rhythm, normal heart sounds and intact distal pulses.   Pulmonary/Chest: Effort normal and breath sounds normal. No respiratory distress. No wheezes. No rales.  Abdominal: Soft. Bowel sounds are normal. Exhibits no distension and no mass. There is no tenderness.  Musculoskeletal: Normal range of motion. Exhibits no edema.  Lymphadenopathy:    No cervical adenopathy.  Neurological: Alert and oriented to person, place, and time. Exhibits normal muscle tone. Gait normal. Coordination normal.  Skin: Skin is warm and dry. No rash noted. Not diaphoretic. No erythema. No pallor.  Psychiatric: Mood, memory and judgment normal.  Vitals reviewed.  LABORATORY DATA: Lab Results  Component Value Date   WBC 8.0 06/26/2020   HGB 11.0 (L) 06/26/2020   HCT 34.2 (L) 06/26/2020   MCV 94.0 06/26/2020   PLT 192 06/26/2020      Chemistry      Component Value Date/Time   NA 138 06/26/2020 1046   K 4.7 06/26/2020 1046   CL 105 06/26/2020 1046   CO2 25 06/26/2020 1046   BUN 17 06/26/2020 1046   CREATININE 1.05 06/26/2020 1046      Component Value Date/Time   CALCIUM 8.9 06/26/2020 1046   ALKPHOS 89 06/26/2020 1046   AST 15 06/26/2020 1046   ALT 17 06/26/2020 1046   BILITOT 0.3 06/26/2020 1046       RADIOGRAPHIC STUDIES:  CT Abdomen Pelvis W Contrast  Result Date: 06/05/2020 CLINICAL DATA:  Primary Cancer Type: Restaging small cell lung cancer. Imaging Indication: Restaging; metastases to liver and bone. Interval therapy since last  imaging? Yes Initial Cancer Diagnosis Date: 04/09/2020; Established by: Biopsy-proven Detailed Pathology: Extensive stage small cell lung cancer. Primary Tumor location: Right upper lobe lung mass. Liver and bone metastases. Chemotherapy: Yes; Ongoing? Yes; Most recent administration: 06/04/2020 Immunotherapy?  Yes; Type: Imfinzi; Ongoing? Yes Radiation therapy? Yes; Date Range: 04/12/2020-05/02/2020; Target: Right lung Other Cancer Therapies: Cosela EXAM: CT ABDOMEN AND PELVIS WITH CONTRAST TECHNIQUE: Multidetector CT imaging of the abdomen and pelvis was performed using the standard protocol following bolus administration of intravenous contrast. CONTRAST:  182m OMNIPAQUE IOHEXOL 300 MG/ML  SOLN COMPARISON:  MR abdomen 04/11/2020.  03/28/2020 PET-CT. FINDINGS: Lower chest: There are 2 enlarged low-attenuation right CP angle lymph nodes. Index node measures 1.7 cm, image 9/2. Previously 1.6 cm. Adjacent lymph node measures 1.3 cm, image 7/2. Previously 1.5 cm. Hepatobiliary: Scattered low-attenuation liver metastases are again identified and appear improved from previous exam. These appear decreased in number and size. -Index lesion in segment 4 measures 0.7 cm, image 21/2. Previously 2.0 cm. -Index lesion within dome of right hepatic lobe measures 0.6 cm, image 18/2. Previously 1.8 cm. -Index lesion within caudate lobe measures 0.9 cm, image 23/2. Previously 2.2 cm. Gallbladder unremarkable.  No biliary dilatation. Pancreas: Unremarkable. No pancreatic ductal dilatation or surrounding inflammatory changes. Spleen: Normal in size without focal abnormality. Adrenals/Urinary Tract: The left adrenal gland appears normal. 1.1 x 1.5 cm right adrenal nodule is unchanged compatible with a benign adenoma. Multiple right kidney cysts are again noted. The largest arises from the upper pole of right kidney measuring 6.1 cm. No hydronephrosis identified bilaterally. Urinary bladder is unremarkable. Stomach/Bowel: Stomach is  within normal limits. Appendix appears normal. No evidence of bowel wall thickening, distention, or inflammatory changes. Scattered colonic diverticula noted. Vascular/Lymphatic: Aortic atherosclerosis. No aneurysm. No abdominopelvic adenopathy. Reproductive: Prostate gland enlargement. Other: No ascites or focal fluid collections. Musculoskeletal: Multi level degenerative disc disease noted within the thoracic and lumbar spine. Bilateral L5 pars defects. No aggressive lytic or sclerotic bone lesions identified. There is no correlate to the previously noted enhancing bone lesions described on MRI from 04/11/2020. IMPRESSION: 1. Interval response to therapy. Decrease in size and number of liver metastases. 2. Similar appearance of enlarged right CP angle lymph nodes. 3. No correlate to the previously noted enhancing bone lesions described on MRI from 04/11/2020. 4. Aortic atherosclerosis. 5. Right adrenal gland adenoma. 6. Right kidney cysts. Aortic Atherosclerosis (ICD10-I70.0). Electronically Signed   By: Kerby Moors M.D.   On: 06/05/2020 11:44     ASSESSMENT/PLAN:  This is a very pleasant 69 year old Caucasian male recently diagnosed with extensive stage (T4, N3, M1 C) small cell lung cancer presented with large right upper lobe lung mass in addition to mediastinal and right supraclavicular lymphadenopathy and multiple metastatic liver lesions diagnosed in April 2021.  The patient is currently undergoing systemic chemotherapy with carboplatin for an AUC 5 on day 1, etoposide 100 mg/m2 on days 1, 2, and 3 in addition to Cosela for myeloprotection during the days of chemotherapy. He also will receive immunotherapy with Imfinzi on day 1 of chemotherapy cycle. He is status post 3 cycles.   Labs were reviewed. Dr. Julien Nordmann would like to keep his neulasta on 7/17 as scheduled. Recommend that he proceed with cycle #4 today as scheduled.   I will arrange for a restaging CT scan of this chest, abdomen, and  pelvis prior to his next cycle of treatment.   We will see him back for a follow up visit in 3 weeks for evaluation before staring cycle #5.   He will continue on eliquis for his recent pulmonary embolism.   The patient was advised to call immediately if he has any concerning symptoms in the interval. The patient voices understanding of current disease status and treatment options and is in agreement with the current care plan. All questions were answered. The patient knows to call the clinic with any problems, questions or concerns. We can certainly see the patient much sooner if necessary  Orders Placed This Encounter  Procedures   CT  Chest W Contrast    Standing Status:   Future    Standing Expiration Date:   06/26/2021    Order Specific Question:   ** REASON FOR EXAM (FREE TEXT)    Answer:   Restaging Small Cell Lung Cancer    Order Specific Question:   If indicated for the ordered procedure, I authorize the administration of contrast media per Radiology protocol    Answer:   Yes    Order Specific Question:   Preferred imaging location?    Answer:   [facility]    Order Specific Question:   Radiology Contrast Protocol - do NOT remove file path    Answer:   \[email or web address]   CT Abdomen Pelvis W Contrast    Standing Status:   Future    Standing Expiration Date:   06/26/2021    Order Specific Question:   ** REASON FOR EXAM (FREE TEXT)    Answer:   Restaging Lung Cancer    Order Specific Question:   If indicated for the ordered procedure, I authorize the administration of contrast media per Radiology protocol    Answer:   Yes    Order Specific Question:   Preferred imaging location?    Answer:   [facility]    Order Specific Question:   Is Oral Contrast requested for this exam?    Answer:   Yes, Per Radiology protocol    Order Specific Question:   Radiology Contrast Protocol - do NOT remove file path    Answer:    \[email or web address]     [place], PA-C 06/26/20

## 2020-06-26 ENCOUNTER — Inpatient Hospital Stay: Payer: Medicare Other

## 2020-06-26 ENCOUNTER — Other Ambulatory Visit: Payer: Self-pay

## 2020-06-26 ENCOUNTER — Inpatient Hospital Stay (HOSPITAL_BASED_OUTPATIENT_CLINIC_OR_DEPARTMENT_OTHER): Payer: Medicare Other | Admitting: Physician Assistant

## 2020-06-26 VITALS — BP 126/71 | HR 86 | Temp 97.6°F | Resp 17 | Ht 69.0 in | Wt 265.0 lb

## 2020-06-26 DIAGNOSIS — C3411 Malignant neoplasm of upper lobe, right bronchus or lung: Secondary | ICD-10-CM | POA: Diagnosis not present

## 2020-06-26 DIAGNOSIS — Z5111 Encounter for antineoplastic chemotherapy: Secondary | ICD-10-CM

## 2020-06-26 DIAGNOSIS — R5382 Chronic fatigue, unspecified: Secondary | ICD-10-CM

## 2020-06-26 DIAGNOSIS — Z5112 Encounter for antineoplastic immunotherapy: Secondary | ICD-10-CM | POA: Diagnosis not present

## 2020-06-26 DIAGNOSIS — Z95828 Presence of other vascular implants and grafts: Secondary | ICD-10-CM

## 2020-06-26 LAB — CBC WITH DIFFERENTIAL (CANCER CENTER ONLY)
Abs Immature Granulocytes: 0.14 10*3/uL — ABNORMAL HIGH (ref 0.00–0.07)
Basophils Absolute: 0.1 10*3/uL (ref 0.0–0.1)
Basophils Relative: 1 %
Eosinophils Absolute: 0.1 10*3/uL (ref 0.0–0.5)
Eosinophils Relative: 1 %
HCT: 34.2 % — ABNORMAL LOW (ref 39.0–52.0)
Hemoglobin: 11 g/dL — ABNORMAL LOW (ref 13.0–17.0)
Immature Granulocytes: 2 %
Lymphocytes Relative: 8 %
Lymphs Abs: 0.6 10*3/uL — ABNORMAL LOW (ref 0.7–4.0)
MCH: 30.2 pg (ref 26.0–34.0)
MCHC: 32.2 g/dL (ref 30.0–36.0)
MCV: 94 fL (ref 80.0–100.0)
Monocytes Absolute: 0.7 10*3/uL (ref 0.1–1.0)
Monocytes Relative: 9 %
Neutro Abs: 6.3 10*3/uL (ref 1.7–7.7)
Neutrophils Relative %: 79 %
Platelet Count: 192 10*3/uL (ref 150–400)
RBC: 3.64 MIL/uL — ABNORMAL LOW (ref 4.22–5.81)
RDW: 18.6 % — ABNORMAL HIGH (ref 11.5–15.5)
WBC Count: 8 10*3/uL (ref 4.0–10.5)
nRBC: 0 % (ref 0.0–0.2)

## 2020-06-26 LAB — TSH: TSH: 0.445 u[IU]/mL (ref 0.320–4.118)

## 2020-06-26 LAB — CMP (CANCER CENTER ONLY)
ALT: 17 U/L (ref 0–44)
AST: 15 U/L (ref 15–41)
Albumin: 3.5 g/dL (ref 3.5–5.0)
Alkaline Phosphatase: 89 U/L (ref 38–126)
Anion gap: 8 (ref 5–15)
BUN: 17 mg/dL (ref 8–23)
CO2: 25 mmol/L (ref 22–32)
Calcium: 8.9 mg/dL (ref 8.9–10.3)
Chloride: 105 mmol/L (ref 98–111)
Creatinine: 1.05 mg/dL (ref 0.61–1.24)
GFR, Est AFR Am: >60 mL/min (ref >60)
GFR, Estimated: >60 mL/min (ref >60)
Glucose, Bld: 187 mg/dL — ABNORMAL HIGH (ref 70–99)
Potassium: 4.7 mmol/L (ref 3.5–5.1)
Sodium: 138 mmol/L (ref 135–145)
Total Bilirubin: 0.3 mg/dL (ref 0.3–1.2)
Total Protein: 6.2 g/dL — ABNORMAL LOW (ref 6.5–8.1)

## 2020-06-26 MED ORDER — PALONOSETRON HCL INJECTION 0.25 MG/5ML
INTRAVENOUS | Status: AC
  Filled 2020-06-26: qty 5

## 2020-06-26 MED ORDER — SODIUM CHLORIDE 0.9% FLUSH
10.0000 mL | Freq: Once | INTRAVENOUS | Status: AC
  Administered 2020-06-26: 10 mL
  Filled 2020-06-26: qty 10

## 2020-06-26 MED ORDER — SODIUM CHLORIDE 0.9 % IV SOLN
Freq: Once | INTRAVENOUS | Status: AC
  Filled 2020-06-26: qty 250

## 2020-06-26 MED ORDER — SODIUM CHLORIDE 0.9 % IV SOLN
660.0000 mg | Freq: Once | INTRAVENOUS | Status: AC
  Administered 2020-06-26: 660 mg via INTRAVENOUS
  Filled 2020-06-26: qty 66

## 2020-06-26 MED ORDER — SODIUM CHLORIDE 0.9 % IV SOLN
10.0000 mg | Freq: Once | INTRAVENOUS | Status: AC
  Administered 2020-06-26: 10 mg via INTRAVENOUS
  Filled 2020-06-26: qty 10

## 2020-06-26 MED ORDER — HEPARIN SOD (PORK) LOCK FLUSH 100 UNIT/ML IV SOLN
500.0000 [IU] | Freq: Once | INTRAVENOUS | Status: AC | PRN
  Administered 2020-06-26: 500 [IU]
  Filled 2020-06-26: qty 5

## 2020-06-26 MED ORDER — SODIUM CHLORIDE 0.9% FLUSH
10.0000 mL | INTRAVENOUS | Status: DC | PRN
  Administered 2020-06-26: 10 mL
  Filled 2020-06-26: qty 10

## 2020-06-26 MED ORDER — SODIUM CHLORIDE 0.9 % IV SOLN
150.0000 mg | Freq: Once | INTRAVENOUS | Status: AC
  Administered 2020-06-26: 150 mg via INTRAVENOUS
  Filled 2020-06-26: qty 150

## 2020-06-26 MED ORDER — SODIUM CHLORIDE 0.9 % IV SOLN
1500.0000 mg | Freq: Once | INTRAVENOUS | Status: AC
  Administered 2020-06-26: 1500 mg via INTRAVENOUS
  Filled 2020-06-26: qty 30

## 2020-06-26 MED ORDER — TRILACICLIB DIHYDROCHLORIDE INJECTION 300 MG
240.0000 mg/m2 | Freq: Once | INTRAVENOUS | Status: AC
  Administered 2020-06-26: 600 mg via INTRAVENOUS
  Filled 2020-06-26: qty 40

## 2020-06-26 MED ORDER — PALONOSETRON HCL INJECTION 0.25 MG/5ML
0.2500 mg | Freq: Once | INTRAVENOUS | Status: AC
  Administered 2020-06-26: 0.25 mg via INTRAVENOUS

## 2020-06-26 MED ORDER — SODIUM CHLORIDE 0.9 % IV SOLN
100.0000 mg/m2 | Freq: Once | INTRAVENOUS | Status: AC
  Administered 2020-06-26: 240 mg via INTRAVENOUS
  Filled 2020-06-26: qty 12

## 2020-06-26 NOTE — Patient Instructions (Signed)
[facility] Discharge Instructions for Patients Receiving Chemotherapy  Today you received the following chemotherapy agents: Imfinzi, Carboplatin, etoposide   To help prevent nausea and vomiting after your treatment, we encourage you to take your nausea medication as directed.    If you develop nausea and vomiting that is not controlled by your nausea medication, call the clinic.   BELOW ARE SYMPTOMS THAT SHOULD BE REPORTED IMMEDIATELY:  *FEVER GREATER THAN 100.5 F  *CHILLS WITH OR WITHOUT FEVER  NAUSEA AND VOMITING THAT IS NOT CONTROLLED WITH YOUR NAUSEA MEDICATION  *UNUSUAL SHORTNESS OF BREATH  *UNUSUAL BRUISING OR BLEEDING  TENDERNESS IN MOUTH AND THROAT WITH OR WITHOUT PRESENCE OF ULCERS  *URINARY PROBLEMS  *BOWEL PROBLEMS  UNUSUAL RASH Items with * indicate a potential emergency and should be followed up as soon as possible.  Feel free to call the clinic should you have any questions or concerns. The clinic phone number is [phone].  Please show the [facility] at check-in to the Emergency Department and triage nurse.

## 2020-06-27 ENCOUNTER — Telehealth: Payer: Self-pay | Admitting: Radiation Oncology

## 2020-06-27 ENCOUNTER — Inpatient Hospital Stay: Payer: Medicare Other

## 2020-06-27 ENCOUNTER — Other Ambulatory Visit: Payer: Self-pay

## 2020-06-27 VITALS — BP 136/85 | HR 99 | Temp 98.3°F | Resp 18

## 2020-06-27 DIAGNOSIS — C3411 Malignant neoplasm of upper lobe, right bronchus or lung: Secondary | ICD-10-CM

## 2020-06-27 DIAGNOSIS — Z5112 Encounter for antineoplastic immunotherapy: Secondary | ICD-10-CM | POA: Diagnosis not present

## 2020-06-27 MED ORDER — SODIUM CHLORIDE 0.9 % IV SOLN
10.0000 mg | Freq: Once | INTRAVENOUS | Status: AC
  Administered 2020-06-27: 10 mg via INTRAVENOUS
  Filled 2020-06-27: qty 10

## 2020-06-27 MED ORDER — SODIUM CHLORIDE 0.9% FLUSH
10.0000 mL | INTRAVENOUS | Status: DC | PRN
  Administered 2020-06-27: 10 mL
  Filled 2020-06-27: qty 10

## 2020-06-27 MED ORDER — SODIUM CHLORIDE 0.9 % IV SOLN
100.0000 mg/m2 | Freq: Once | INTRAVENOUS | Status: AC
  Administered 2020-06-27: 240 mg via INTRAVENOUS
  Filled 2020-06-27: qty 12

## 2020-06-27 MED ORDER — SODIUM CHLORIDE 0.9 % IV SOLN
Freq: Once | INTRAVENOUS | Status: AC
  Filled 2020-06-27: qty 250

## 2020-06-27 MED ORDER — TRILACICLIB DIHYDROCHLORIDE INJECTION 300 MG
240.0000 mg/m2 | Freq: Once | INTRAVENOUS | Status: AC
  Administered 2020-06-27: 600 mg via INTRAVENOUS
  Filled 2020-06-27: qty 40

## 2020-06-27 MED ORDER — HEPARIN SOD (PORK) LOCK FLUSH 100 UNIT/ML IV SOLN
500.0000 [IU] | Freq: Once | INTRAVENOUS | Status: AC | PRN
  Administered 2020-06-27: 500 [IU]
  Filled 2020-06-27: qty 5

## 2020-06-27 NOTE — Patient Instructions (Signed)
[facility] Discharge Instructions for Patients Receiving Chemotherapy  Today you received the following chemotherapy agents Etoposide  To help prevent nausea and vomiting after your treatment, we encourage you to take your nausea medication as directed   If you develop nausea and vomiting that is not controlled by your nausea medication, call the clinic.   BELOW ARE SYMPTOMS THAT SHOULD BE REPORTED IMMEDIATELY:  *FEVER GREATER THAN 100.5 F  *CHILLS WITH OR WITHOUT FEVER  NAUSEA AND VOMITING THAT IS NOT CONTROLLED WITH YOUR NAUSEA MEDICATION  *UNUSUAL SHORTNESS OF BREATH  *UNUSUAL BRUISING OR BLEEDING  TENDERNESS IN MOUTH AND THROAT WITH OR WITHOUT PRESENCE OF ULCERS  *URINARY PROBLEMS  *BOWEL PROBLEMS  UNUSUAL RASH Items with * indicate a potential emergency and should be followed up as soon as possible.  Feel free to call the clinic should you have any questions or concerns. The clinic phone number is [phone].  Please show the [facility] [place] at check-in to the Emergency Department and triage nurse.

## 2020-06-27 NOTE — Telephone Encounter (Signed)
  Radiation Oncology         [phone] ________________________________  Name: Marsalis Beaulieu MRN: [id number]  Date of Service: 07/02/20  DOB: 11-20-1951  Post Treatment Telephone Note  Diagnosis:   Extensive Stage (T4, N3, M1 C) small cell lung cancer of the right hilum involving the RUL and RML.  Interval Since Last Radiation: 8 weeks   04/12/20-05/02/20:  The right lung target overlapping the RUL and RML was treated to 37.5 Gy in 15 fractions  Narrative:  The patient was contacted today for routine follow-up. During treatment he did very well with radiotherapy and did not have significant desquamation. He reports he is doing pretty well and is now able to eat anything without dysphagia. He will have another CT scan after his current cycle of chemotherapy  Impression/Plan: 1. Extensive Stage (T4, N3, M1 C) small cell lung cancer of the right hilum involving the RUL and RML. The patient has been doing well since completion of radiotherapy. We discussed that we would be happy to continue to follow him as he continues systemic therapy and the rationale to continue discussion about prophylactic cranial irradiation, but he will also continue to follow up with Dr. Julien Nordmann in medical oncology.  2. PCI. I spoke with the patient about the rationale for PCI even in extensive stage patients and that I would follow up with him once he's had CT imaging and is completed with systemic chemotherapy. At that point we would repeat an MRI of the brain prior to offering treatment.    Carola Rhine, PAC

## 2020-06-28 ENCOUNTER — Other Ambulatory Visit: Payer: Self-pay

## 2020-06-28 ENCOUNTER — Inpatient Hospital Stay: Payer: Medicare Other

## 2020-06-28 VITALS — BP 148/84 | HR 90 | Temp 98.1°F | Resp 18

## 2020-06-28 DIAGNOSIS — C3411 Malignant neoplasm of upper lobe, right bronchus or lung: Secondary | ICD-10-CM

## 2020-06-28 DIAGNOSIS — Z5112 Encounter for antineoplastic immunotherapy: Secondary | ICD-10-CM | POA: Diagnosis not present

## 2020-06-28 MED ORDER — SODIUM CHLORIDE 0.9% FLUSH
10.0000 mL | INTRAVENOUS | Status: DC | PRN
  Filled 2020-06-28: qty 10

## 2020-06-28 MED ORDER — SODIUM CHLORIDE 0.9 % IV SOLN
100.0000 mg/m2 | Freq: Once | INTRAVENOUS | Status: AC
  Administered 2020-06-28: 240 mg via INTRAVENOUS
  Filled 2020-06-28: qty 12

## 2020-06-28 MED ORDER — SODIUM CHLORIDE 0.9 % IV SOLN
Freq: Once | INTRAVENOUS | Status: AC
  Filled 2020-06-28: qty 250

## 2020-06-28 MED ORDER — TRILACICLIB DIHYDROCHLORIDE INJECTION 300 MG
240.0000 mg/m2 | Freq: Once | INTRAVENOUS | Status: AC
  Administered 2020-06-28: 600 mg via INTRAVENOUS
  Filled 2020-06-28: qty 40

## 2020-06-28 MED ORDER — HEPARIN SOD (PORK) LOCK FLUSH 100 UNIT/ML IV SOLN
500.0000 [IU] | Freq: Once | INTRAVENOUS | Status: DC | PRN
  Filled 2020-06-28: qty 5

## 2020-06-28 MED ORDER — SODIUM CHLORIDE 0.9 % IV SOLN
10.0000 mg | Freq: Once | INTRAVENOUS | Status: AC
  Administered 2020-06-28: 10 mg via INTRAVENOUS
  Filled 2020-06-28: qty 10

## 2020-06-28 NOTE — Patient Instructions (Signed)
[facility] Discharge Instructions for Patients Receiving Chemotherapy  Today you received the following chemotherapy agents Etoposide  To help prevent nausea and vomiting after your treatment, we encourage you to take your nausea medication as directed   If you develop nausea and vomiting that is not controlled by your nausea medication, call the clinic.   BELOW ARE SYMPTOMS THAT SHOULD BE REPORTED IMMEDIATELY:  *FEVER GREATER THAN 100.5 F  *CHILLS WITH OR WITHOUT FEVER  NAUSEA AND VOMITING THAT IS NOT CONTROLLED WITH YOUR NAUSEA MEDICATION  *UNUSUAL SHORTNESS OF BREATH  *UNUSUAL BRUISING OR BLEEDING  TENDERNESS IN MOUTH AND THROAT WITH OR WITHOUT PRESENCE OF ULCERS  *URINARY PROBLEMS  *BOWEL PROBLEMS  UNUSUAL RASH Items with * indicate a potential emergency and should be followed up as soon as possible.  Feel free to call the clinic should you have any questions or concerns. The clinic phone number is [phone].  Please show the [facility] at check-in to the Emergency Department and triage nurse.

## 2020-06-30 ENCOUNTER — Telehealth: Payer: Self-pay | Admitting: Emergency Medicine

## 2020-06-30 ENCOUNTER — Inpatient Hospital Stay: Payer: Medicare Other

## 2020-06-30 NOTE — Telephone Encounter (Signed)
Due to limited supply of Fulphila injections pt's injection appt moved to coincide with port flush appt on 07/02/20.  Spoke with pt's wife who agreed to change in plan of care.  Appt for 7/17 cancelled.  On-call MD Lindi Adie made aware and agreed to plan.

## 2020-07-02 ENCOUNTER — Inpatient Hospital Stay: Payer: Medicare Other

## 2020-07-02 ENCOUNTER — Other Ambulatory Visit: Payer: Self-pay

## 2020-07-02 ENCOUNTER — Telehealth: Payer: Self-pay | Admitting: Medical Oncology

## 2020-07-02 ENCOUNTER — Telehealth: Payer: Self-pay | Admitting: Physician Assistant

## 2020-07-02 VITALS — BP 134/75 | HR 96 | Resp 17

## 2020-07-02 DIAGNOSIS — C3411 Malignant neoplasm of upper lobe, right bronchus or lung: Secondary | ICD-10-CM

## 2020-07-02 DIAGNOSIS — Z95828 Presence of other vascular implants and grafts: Secondary | ICD-10-CM

## 2020-07-02 DIAGNOSIS — Z5112 Encounter for antineoplastic immunotherapy: Secondary | ICD-10-CM | POA: Diagnosis not present

## 2020-07-02 LAB — CMP (CANCER CENTER ONLY)
ALT: 15 U/L (ref 0–44)
AST: 13 U/L — ABNORMAL LOW (ref 15–41)
Albumin: 3.5 g/dL (ref 3.5–5.0)
Alkaline Phosphatase: 77 U/L (ref 38–126)
Anion gap: 8 (ref 5–15)
BUN: 21 mg/dL (ref 8–23)
CO2: 25 mmol/L (ref 22–32)
Calcium: 9 mg/dL (ref 8.9–10.3)
Chloride: 103 mmol/L (ref 98–111)
Creatinine: 1.03 mg/dL (ref 0.61–1.24)
GFR, Est AFR Am: >60 mL/min (ref >60)
GFR, Estimated: >60 mL/min (ref >60)
Glucose, Bld: 225 mg/dL — ABNORMAL HIGH (ref 70–99)
Potassium: 4.5 mmol/L (ref 3.5–5.1)
Sodium: 136 mmol/L (ref 135–145)
Total Bilirubin: 0.5 mg/dL (ref 0.3–1.2)
Total Protein: 6.1 g/dL — ABNORMAL LOW (ref 6.5–8.1)

## 2020-07-02 LAB — CBC WITH DIFFERENTIAL (CANCER CENTER ONLY)
Abs Immature Granulocytes: 0.02 10*3/uL (ref 0.00–0.07)
Basophils Absolute: 0 10*3/uL (ref 0.0–0.1)
Basophils Relative: 1 %
Eosinophils Absolute: 0.1 10*3/uL (ref 0.0–0.5)
Eosinophils Relative: 2 %
HCT: 32.2 % — ABNORMAL LOW (ref 39.0–52.0)
Hemoglobin: 10.6 g/dL — ABNORMAL LOW (ref 13.0–17.0)
Immature Granulocytes: 1 %
Lymphocytes Relative: 7 %
Lymphs Abs: 0.3 10*3/uL — ABNORMAL LOW (ref 0.7–4.0)
MCH: 31 pg (ref 26.0–34.0)
MCHC: 32.9 g/dL (ref 30.0–36.0)
MCV: 94.2 fL (ref 80.0–100.0)
Monocytes Absolute: 0.1 10*3/uL (ref 0.1–1.0)
Monocytes Relative: 2 %
Neutro Abs: 3.7 10*3/uL (ref 1.7–7.7)
Neutrophils Relative %: 87 %
Platelet Count: 158 10*3/uL (ref 150–400)
RBC: 3.42 MIL/uL — ABNORMAL LOW (ref 4.22–5.81)
RDW: 16.9 % — ABNORMAL HIGH (ref 11.5–15.5)
WBC Count: 4.2 10*3/uL (ref 4.0–10.5)
nRBC: 0 % (ref 0.0–0.2)

## 2020-07-02 MED ORDER — PEGFILGRASTIM-CBQV 6 MG/0.6ML ~~LOC~~ SOSY
6.0000 mg | PREFILLED_SYRINGE | Freq: Once | SUBCUTANEOUS | Status: AC
  Administered 2020-07-02: 6 mg via SUBCUTANEOUS

## 2020-07-02 MED ORDER — PEGFILGRASTIM-CBQV 6 MG/0.6ML ~~LOC~~ SOSY
PREFILLED_SYRINGE | SUBCUTANEOUS | Status: AC
  Filled 2020-07-02: qty 0.6

## 2020-07-02 MED ORDER — PEGFILGRASTIM-JMDB 6 MG/0.6ML ~~LOC~~ SOSY: 6.0000 mg | PREFILLED_SYRINGE | Freq: Once | SUBCUTANEOUS | Status: DC

## 2020-07-02 MED ORDER — HEPARIN SOD (PORK) LOCK FLUSH 100 UNIT/ML IV SOLN
500.0000 [IU] | Freq: Once | INTRAVENOUS | Status: AC
  Administered 2020-07-02: 500 [IU]
  Filled 2020-07-02: qty 5

## 2020-07-02 MED ORDER — SODIUM CHLORIDE 0.9% FLUSH
10.0000 mL | Freq: Once | INTRAVENOUS | Status: AC
  Administered 2020-07-02: 10 mL
  Filled 2020-07-02: qty 10

## 2020-07-02 NOTE — Progress Notes (Signed)
Per Carolann Littler in pharmacy they are going to change him to [place] as the fulphila did not come in order today like it was suppose to. He is medicare so it is not a problem

## 2020-07-02 NOTE — Telephone Encounter (Signed)
Scheduled per los. Patient given printout after injection and flush.

## 2020-07-02 NOTE — Telephone Encounter (Signed)
I gave pt the number for central scheduling to schedule his CT scan.

## 2020-07-02 NOTE — Patient Instructions (Addendum)

## 2020-07-05 ENCOUNTER — Telehealth: Payer: Self-pay | Admitting: Medical Oncology

## 2020-07-05 NOTE — Telephone Encounter (Signed)
Pt instructed to keep CT scan appt for next week.

## 2020-07-09 ENCOUNTER — Inpatient Hospital Stay: Payer: Medicare Other

## 2020-07-09 ENCOUNTER — Other Ambulatory Visit: Payer: Self-pay

## 2020-07-09 DIAGNOSIS — Z5112 Encounter for antineoplastic immunotherapy: Secondary | ICD-10-CM | POA: Diagnosis not present

## 2020-07-09 DIAGNOSIS — C3411 Malignant neoplasm of upper lobe, right bronchus or lung: Secondary | ICD-10-CM

## 2020-07-09 LAB — CBC WITH DIFFERENTIAL (CANCER CENTER ONLY)
Abs Immature Granulocytes: 0.53 10*3/uL — ABNORMAL HIGH (ref 0.00–0.07)
Basophils Absolute: 0.1 10*3/uL (ref 0.0–0.1)
Basophils Relative: 1 %
Eosinophils Absolute: 0.1 10*3/uL (ref 0.0–0.5)
Eosinophils Relative: 1 %
HCT: 32.1 % — ABNORMAL LOW (ref 39.0–52.0)
Hemoglobin: 10.3 g/dL — ABNORMAL LOW (ref 13.0–17.0)
Immature Granulocytes: 6 %
Lymphocytes Relative: 7 %
Lymphs Abs: 0.7 10*3/uL (ref 0.7–4.0)
MCH: 31.4 pg (ref 26.0–34.0)
MCHC: 32.1 g/dL (ref 30.0–36.0)
MCV: 97.9 fL (ref 80.0–100.0)
Monocytes Absolute: 1 10*3/uL (ref 0.1–1.0)
Monocytes Relative: 10 %
Neutro Abs: 7.3 10*3/uL (ref 1.7–7.7)
Neutrophils Relative %: 75 %
Platelet Count: 58 10*3/uL — ABNORMAL LOW (ref 150–400)
RBC: 3.28 MIL/uL — ABNORMAL LOW (ref 4.22–5.81)
RDW: 18.5 % — ABNORMAL HIGH (ref 11.5–15.5)
WBC Count: 9.7 10*3/uL (ref 4.0–10.5)
nRBC: 0.5 % — ABNORMAL HIGH (ref 0.0–0.2)

## 2020-07-09 LAB — CMP (CANCER CENTER ONLY)
ALT: 18 U/L (ref 0–44)
AST: 14 U/L — ABNORMAL LOW (ref 15–41)
Albumin: 3.5 g/dL (ref 3.5–5.0)
Alkaline Phosphatase: 114 U/L (ref 38–126)
Anion gap: 8 (ref 5–15)
BUN: 15 mg/dL (ref 8–23)
CO2: 25 mmol/L (ref 22–32)
Calcium: 9 mg/dL (ref 8.9–10.3)
Chloride: 106 mmol/L (ref 98–111)
Creatinine: 1.19 mg/dL (ref 0.61–1.24)
GFR, Est AFR Am: >60 mL/min (ref >60)
GFR, Estimated: >60 mL/min (ref >60)
Glucose, Bld: 237 mg/dL — ABNORMAL HIGH (ref 70–99)
Potassium: 4.4 mmol/L (ref 3.5–5.1)
Sodium: 139 mmol/L (ref 135–145)
Total Bilirubin: 0.2 mg/dL — ABNORMAL LOW (ref 0.3–1.2)
Total Protein: 6.1 g/dL — ABNORMAL LOW (ref 6.5–8.1)

## 2020-07-12 ENCOUNTER — Encounter (HOSPITAL_COMMUNITY): Payer: Self-pay

## 2020-07-12 ENCOUNTER — Ambulatory Visit (HOSPITAL_COMMUNITY)
Admission: RE | Admit: 2020-07-12 | Discharge: 2020-07-12 | Disposition: A | Discharge status: Home / Self Care | Payer: Medicare Other | Source: Ambulatory Visit | Attending: Physician Assistant | Admitting: Physician Assistant

## 2020-07-12 ENCOUNTER — Other Ambulatory Visit: Payer: Self-pay

## 2020-07-12 DIAGNOSIS — C3411 Malignant neoplasm of upper lobe, right bronchus or lung: Secondary | ICD-10-CM

## 2020-07-12 HISTORY — DX: Malignant (primary) neoplasm, unspecified: C80.1

## 2020-07-12 MED ORDER — SODIUM CHLORIDE (PF) 0.9 % IJ SOLN
INTRAMUSCULAR | Status: AC
  Filled 2020-07-12: qty 50

## 2020-07-12 MED ORDER — HEPARIN SOD (PORK) LOCK FLUSH 100 UNIT/ML IV SOLN
INTRAVENOUS | Status: AC
  Filled 2020-07-12: qty 5

## 2020-07-12 MED ORDER — HEPARIN SOD (PORK) LOCK FLUSH 100 UNIT/ML IV SOLN
500.0000 [IU] | Freq: Once | INTRAVENOUS | Status: AC
  Administered 2020-07-12: 500 [IU] via INTRAVENOUS

## 2020-07-12 MED ORDER — IOHEXOL 300 MG/ML  SOLN
100.0000 mL | Freq: Once | INTRAMUSCULAR | Status: AC | PRN
  Administered 2020-07-12: 100 mL via INTRAVENOUS

## 2020-07-16 ENCOUNTER — Other Ambulatory Visit: Payer: Self-pay | Admitting: Physician Assistant

## 2020-07-16 ENCOUNTER — Ambulatory Visit: Payer: Medicare Other

## 2020-07-16 DIAGNOSIS — C3411 Malignant neoplasm of upper lobe, right bronchus or lung: Secondary | ICD-10-CM

## 2020-07-17 ENCOUNTER — Other Ambulatory Visit: Payer: Self-pay | Admitting: Internal Medicine

## 2020-07-17 ENCOUNTER — Inpatient Hospital Stay: Payer: Medicare Other | Attending: Internal Medicine | Admitting: Internal Medicine

## 2020-07-17 ENCOUNTER — Other Ambulatory Visit: Payer: Self-pay

## 2020-07-17 ENCOUNTER — Inpatient Hospital Stay: Payer: Medicare Other

## 2020-07-17 ENCOUNTER — Encounter: Payer: Self-pay | Admitting: *Deleted

## 2020-07-17 ENCOUNTER — Encounter: Payer: Self-pay | Admitting: Internal Medicine

## 2020-07-17 VITALS — BP 131/71 | HR 95 | Temp 97.8°F | Resp 18 | Ht 69.0 in | Wt 268.3 lb

## 2020-07-17 DIAGNOSIS — Z5112 Encounter for antineoplastic immunotherapy: Secondary | ICD-10-CM | POA: Insufficient documentation

## 2020-07-17 DIAGNOSIS — I10 Essential (primary) hypertension: Secondary | ICD-10-CM

## 2020-07-17 DIAGNOSIS — Z7901 Long term (current) use of anticoagulants: Secondary | ICD-10-CM | POA: Diagnosis not present

## 2020-07-17 DIAGNOSIS — Z86711 Personal history of pulmonary embolism: Secondary | ICD-10-CM | POA: Insufficient documentation

## 2020-07-17 DIAGNOSIS — C3411 Malignant neoplasm of upper lobe, right bronchus or lung: Secondary | ICD-10-CM | POA: Diagnosis present

## 2020-07-17 DIAGNOSIS — Z79899 Other long term (current) drug therapy: Secondary | ICD-10-CM | POA: Diagnosis not present

## 2020-07-17 DIAGNOSIS — Z95828 Presence of other vascular implants and grafts: Secondary | ICD-10-CM

## 2020-07-17 DIAGNOSIS — R5382 Chronic fatigue, unspecified: Secondary | ICD-10-CM

## 2020-07-17 LAB — CBC WITH DIFFERENTIAL (CANCER CENTER ONLY)
Abs Immature Granulocytes: 0.1 10*3/uL — ABNORMAL HIGH (ref 0.00–0.07)
Basophils Absolute: 0.1 10*3/uL (ref 0.0–0.1)
Basophils Relative: 1 %
Eosinophils Absolute: 0.1 10*3/uL (ref 0.0–0.5)
Eosinophils Relative: 1 %
HCT: 33.2 % — ABNORMAL LOW (ref 39.0–52.0)
Hemoglobin: 10.8 g/dL — ABNORMAL LOW (ref 13.0–17.0)
Immature Granulocytes: 1 %
Lymphocytes Relative: 8 %
Lymphs Abs: 0.6 10*3/uL — ABNORMAL LOW (ref 0.7–4.0)
MCH: 31.2 pg (ref 26.0–34.0)
MCHC: 32.5 g/dL (ref 30.0–36.0)
MCV: 96 fL (ref 80.0–100.0)
Monocytes Absolute: 0.6 10*3/uL (ref 0.1–1.0)
Monocytes Relative: 7 %
Neutro Abs: 6.7 10*3/uL (ref 1.7–7.7)
Neutrophils Relative %: 82 %
Platelet Count: 168 10*3/uL (ref 150–400)
RBC: 3.46 MIL/uL — ABNORMAL LOW (ref 4.22–5.81)
RDW: 17.7 % — ABNORMAL HIGH (ref 11.5–15.5)
WBC Count: 8.2 10*3/uL (ref 4.0–10.5)
nRBC: 0 % (ref 0.0–0.2)

## 2020-07-17 LAB — CMP (CANCER CENTER ONLY)
ALT: 14 U/L (ref 0–44)
AST: 14 U/L — ABNORMAL LOW (ref 15–41)
Albumin: 3.6 g/dL (ref 3.5–5.0)
Alkaline Phosphatase: 100 U/L (ref 38–126)
Anion gap: 8 (ref 5–15)
BUN: 13 mg/dL (ref 8–23)
CO2: 25 mmol/L (ref 22–32)
Calcium: 9 mg/dL (ref 8.9–10.3)
Chloride: 105 mmol/L (ref 98–111)
Creatinine: 1.17 mg/dL (ref 0.61–1.24)
GFR, Est AFR Am: >60 mL/min (ref >60)
GFR, Estimated: >60 mL/min (ref >60)
Glucose, Bld: 183 mg/dL — ABNORMAL HIGH (ref 70–99)
Potassium: 4.5 mmol/L (ref 3.5–5.1)
Sodium: 138 mmol/L (ref 135–145)
Total Bilirubin: 0.3 mg/dL (ref 0.3–1.2)
Total Protein: 6 g/dL — ABNORMAL LOW (ref 6.5–8.1)

## 2020-07-17 LAB — TSH: TSH: 0.925 u[IU]/mL (ref 0.320–4.118)

## 2020-07-17 MED ORDER — HEPARIN SOD (PORK) LOCK FLUSH 100 UNIT/ML IV SOLN
500.0000 [IU] | Freq: Once | INTRAVENOUS | Status: AC | PRN
  Administered 2020-07-17: 500 [IU]
  Filled 2020-07-17: qty 5

## 2020-07-17 MED ORDER — SODIUM CHLORIDE 0.9% FLUSH
10.0000 mL | INTRAVENOUS | Status: DC | PRN
  Administered 2020-07-17: 10 mL
  Filled 2020-07-17: qty 10

## 2020-07-17 MED ORDER — SODIUM CHLORIDE 0.9% FLUSH
10.0000 mL | Freq: Once | INTRAVENOUS | Status: AC
  Administered 2020-07-17: 10 mL
  Filled 2020-07-17: qty 10

## 2020-07-17 MED ORDER — SODIUM CHLORIDE 0.9 % IV SOLN
1500.0000 mg | Freq: Once | INTRAVENOUS | Status: AC
  Administered 2020-07-17: 1500 mg via INTRAVENOUS
  Filled 2020-07-17: qty 30

## 2020-07-17 MED ORDER — SODIUM CHLORIDE 0.9 % IV SOLN
Freq: Once | INTRAVENOUS | Status: AC
  Filled 2020-07-17: qty 250

## 2020-07-17 NOTE — Progress Notes (Signed)
[facility] [phone]   [phone]  OFFICE PROGRESS NOTE  [name], NP [place] [address]  DIAGNOSIS: Extensive stage (T4, N3, M1c)  small cell lung cancer diagnosed in April 2021 and presented with large right upper lobe/right hilar mass with ipsilateral and contralateral mediastinal and right supraclavicular lymphadenopathy in addition to multiple liver lesios  PRIOR THERAPY: Palliative radiotherapy to the right upper lobe lung mass under the care of Dr. Lisbeth Renshaw  CURRENT THERAPY: Systemic chemotherapy with carboplatin for AUC of 5 on day 1, etoposide 100 mg/M2 on days 1, 2 and 3 in addition to Imfinzi 1500 mg IV every 3 weeks with chemotherapy then every 4 weeks for maintenance if the patient has no evidence for progression.  He will also receive Cosela 240 mg/m2 on the days of the chemotherapy.  Status post 4 cycles.  Starting from cycle #5 the patient will be on maintenance treatment with immunotherapy with Imfinzi 1500 mg IV every 4 weeks.  INTERVAL HISTORY: Adam Wise 69 y.o. male returns to the clinic today for follow-up visit.  The patient is feeling fine today with no concerning complaints.  He celebrated his birthday 2 weeks ago.  The patient denied having any current chest pain, shortness of breath, cough or hemoptysis.  He denied having any fever or chills.  He has no nausea, vomiting, diarrhea or constipation.  He has no headache or visual changes.  He tolerated his previous systemic chemotherapy fairly well.  He had repeat CT scan of the chest, abdomen pelvis performed recently and he is here for evaluation and discussion of his discuss results.  MEDICAL HISTORY: Past Medical History:  Diagnosis Date  . Cancer ([place])   . Chickenpox   . Chronic knee pain   . Chronic low back pain   . Essential hypertension   . GERD (gastroesophageal reflux disease)   . OSA (obstructive sleep apnea)   . Testosterone  deficiency   . Type 2 diabetes mellitus ([facility])     ALLERGIES:  is allergic to bupropion.  MEDICATIONS:  Current Outpatient Medications  Medication Sig Dispense Refill  . acetaminophen (TYLENOL) 500 MG tablet Take 1,500 mg by mouth every 8 (eight) hours as needed for moderate pain or headache.    . albuterol (VENTOLIN HFA) 108 (90 Base) MCG/ACT inhaler Inhale 2 puffs into the lungs every 6 (six) hours as needed for wheezing or shortness of breath. 18 g 0  . amLODipine (NORVASC) 10 MG tablet TAKE 1 TABLET BY MOUTH EVERY DAY (Patient taking differently: Take 10 mg by mouth daily. ) 90 tablet 1  . apixaban (ELIQUIS) 5 MG TABS tablet Take 1 tablet (5 mg total) by mouth 2 (two) times daily. 60 tablet 2  . APIXABAN (ELIQUIS) VTE STARTER PACK ('10MG'$  AND '5MG'$ ) Take as directed on package: start with two-'5mg'$  tablets twice daily for 7 days. On day 8, switch to one-'5mg'$  tablet twice daily. 1 each 0  . Black Pepper-Turmeric (TURMERIC COMPLEX/BLACK PEPPER PO) Take 1 tablet by mouth in the morning and at bedtime.    . blood glucose meter kit and supplies KIT Dispense based on patient and insurance preference. Use up to four times daily as directed. 1 each 0  . chlorpheniramine-HYDROcodone (TUSSIONEX) 10-8 MG/5ML SUER Take 5 mLs by mouth at bedtime as needed for cough. 140 mL 0  . Coenzyme Q10 ([employer] COQ-10) 200 MG capsule Take 200 mg by mouth every evening.     Marland Kitchen  esomeprazole (NEXIUM) 20 MG capsule Take 20 mg by mouth daily.     . fluticasone (FLONASE) 50 MCG/ACT nasal spray Place 2 sprays into both nostrils daily as needed for allergies.     Marland Kitchen gabapentin (NEURONTIN) 300 MG capsule Take 300 mg by mouth 2 (two) times daily.     Marland Kitchen glipiZIDE (GLUCOTROL) 10 MG tablet TAKE 1 TABLET (10 MG TOTAL) BY MOUTH 2 (TWO) TIMES DAILY BEFORE A MEAL. FOR DIABETES. 180 tablet 2  . HYDROcodone-homatropine (HYCODAN) 5-1.5 MG/5ML syrup Take 5 mLs by mouth at bedtime as needed for cough. (Patient not taking: Reported on 06/04/2020)    .  KRILL OIL PO Take 350 mg by mouth daily.    . Lancets MISC USE UP TO 3 TIMES DAILY AS DIRECTED 100 each 2  . lidocaine-prilocaine (EMLA) cream Apply 1 application topically as needed. (Patient taking differently: Apply 1 application topically as needed (access port). ) 30 g 0  . lisinopril (ZESTRIL) 5 MG tablet TAKE 1 TABLET BY MOUTH EVERY DAY (Patient taking differently: Take 5 mg by mouth daily. ) 90 tablet 1  . Melatonin 10 MG CAPS Take 10 mg by mouth at bedtime.    . metFORMIN (GLUCOPHAGE) 1000 MG tablet TAKE 1 TABLET (1,000 MG TOTAL) BY MOUTH 2 (TWO) TIMES DAILY WITH A MEAL. 180 tablet 1  . Multiple Vitamin (MULTI-VITAMINS) TABS Take 1 tablet by mouth daily.     Glory Rosebush ULTRA test strip USE UP TO 4 TIMES DAILY AS DIRECTED 100 strip 5  . oxymetazoline (AFRIN) 0.05 % nasal spray Place 1 spray into both nostrils 2 (two) times daily as needed for congestion.    . pioglitazone (ACTOS) 45 MG tablet TAKE 1 TABLET BY MOUTH EVERY DAY (Patient taking differently: Take 45 mg by mouth daily. ) 90 tablet 1  . pravastatin (PRAVACHOL) 40 MG tablet TAKE 1 TABLET BY MOUTH EVERY DAY IN THE EVENING FOR CHOLESTEROL (Patient taking differently: Take 40 mg by mouth daily. ) 90 tablet 1  . prochlorperazine (COMPAZINE) 10 MG tablet Take 1 tablet (10 mg total) by mouth every 6 (six) hours as needed for nausea or vomiting. 30 tablet 0  . sildenafil (VIAGRA) 50 MG tablet TAKE 1 TABLET BY MOUTH EVERY DAY AS NEEDED 12 tablet 1  . sucralfate (CARAFATE) 1 g tablet DISSOLVE ONE TAB IN 15ML'S OF WATER AND TAKE BY MOUTH FOUR TIMES A DAY AS DIRECTED 120 tablet 0  . SYRINGE-NEEDLE, DISP, 3 ML (B-D 3CC LUER-LOK SYR 22GX1") 22G X 1" 3 ML MISC USE AS INSTRUCTED FOR TESTOSTERONE INJECTION EVERY 2 WEEKS 10 each 2  . testosterone cypionate (DEPOTESTOSTERONE CYPIONATE) 200 MG/ML injection Inject 200 mg into the muscle every 14 (fourteen) days.     No current facility-administered medications for this visit.    SURGICAL HISTORY:    Past Surgical History:  Procedure Laterality Date  . COLONOSCOPY WITH PROPOFOL N/A 12/17/2018   Procedure: COLONOSCOPY WITH PROPOFOL;  Surgeon: Virgel Manifold, MD;  Location: [facility] ENDOSCOPY;  Service: Gastroenterology;  Laterality: N/A;  . IR IMAGING GUIDED PORT INSERTION  04/17/2020  . JOINT REPLACEMENT Bilateral   . REPLACEMENT TOTAL KNEE BILATERAL  2015  . TONSILLECTOMY  1960    REVIEW OF SYSTEMS:  Constitutional: negative Eyes: negative Ears, nose, mouth, throat, and face: negative Respiratory: positive for dyspnea on exertion Cardiovascular: negative Gastrointestinal: negative Genitourinary:negative Integument/breast: negative Hematologic/lymphatic: negative Musculoskeletal:negative Neurological: negative Behavioral/Psych: negative Endocrine: negative Allergic/Immunologic: negative   PHYSICAL EXAMINATION: General appearance: alert, cooperative,  fatigued and no distress Head: atraumatic Neck: no adenopathy, no JVD, supple, symmetrical, trachea midline and thyroid not enlarged, symmetric, no tenderness/mass/nodules Lymph nodes: Cervical, supraclavicular, and axillary nodes normal. Resp: clear to auscultation bilaterally Back: symmetric, no curvature. ROM normal. No CVA tenderness. Cardio: regular rate and rhythm, S1, S2 normal, no murmur, click, rub or gallop GI: soft, non-tender; bowel sounds normal; no masses,  no organomegaly Extremities: extremities normal, atraumatic, no cyanosis or edema Neurologic: Alert and oriented X 3, normal strength and tone. Normal symmetric reflexes. Normal coordination and gait  ECOG PERFORMANCE STATUS: 1 - Symptomatic but completely ambulatory  Blood pressure 131/71, pulse 95, temperature 97.8 F (36.6 C), temperature source Temporal, resp. rate 18, height '5\' 9"'$  (1.753 m), weight 268 lb 4.8 oz (121.7 kg), SpO2 97 %.  LABORATORY DATA: Lab Results  Component Value Date   WBC 8.2 07/17/2020   HGB 10.8 (L) 07/17/2020   HCT 33.2 (L)  07/17/2020   MCV 96.0 07/17/2020   PLT 168 07/17/2020      Chemistry      Component Value Date/Time   NA 139 07/09/2020 1028   K 4.4 07/09/2020 1028   CL 106 07/09/2020 1028   CO2 25 07/09/2020 1028   BUN 15 07/09/2020 1028   CREATININE 1.19 07/09/2020 1028      Component Value Date/Time   CALCIUM 9.0 07/09/2020 1028   ALKPHOS 114 07/09/2020 1028   AST 14 (L) 07/09/2020 1028   ALT 18 07/09/2020 1028   BILITOT 0.2 (L) 07/09/2020 1028       RADIOGRAPHIC STUDIES: CT Chest W Contrast  Result Date: 07/12/2020 CLINICAL DATA:  Restaging of lung cancer with ongoing systemic therapy, history of RIGHT lung cancer, small cell. Shown to have contralateral mediastinal and RIGHT supraclavicular adenopathy in addition to hepatic metastatic disease EXAM: CT CHEST, ABDOMEN, AND PELVIS WITH CONTRAST TECHNIQUE: Multidetector CT imaging of the chest, abdomen and pelvis was performed following the standard protocol during bolus administration of intravenous contrast. CONTRAST:  140m OMNIPAQUE IOHEXOL 300 MG/ML  SOLN COMPARISON:  June 05, 2020 and multiple prior studies. FINDINGS: CT CHEST FINDINGS Cardiovascular: Central filling defect in LEFT lower lobe pulmonary artery, diminished thrombus grossly when compared to previous imaging in this patient with history of pulmonary emboli previously showing occlusive thrombus in this area. Now with improvement. RIGHT Port-A-Cath in situ. Heart size is normal. No pericardial effusion. Atheromatous plaque in the thoracic aorta. No aneurysmal dilation. Mediastinum/Nodes: Marked decrease in size of RIGHT paratracheal, subcarinal and mediastinal/hilar soft tissue. At the level of the RIGHT mainstem bronchus/upper lobe bronchial origin this measures approximately 4.6 x 2.6 cm previously in this location this measured approximately 6.1 x 3.6 cm. Subcarinal adenopathy is no longer confluent with this area measuring 13 mm as compared to 21 mm short axis (image 28, series 2)  RIGHT paratracheal lymph nodes continue to improve with similar changes. Low-density structure along the anterior RIGHT heart border is stable over time perhaps representing a small pericardial cyst. This is lobulated in shows well-circumscribed margins with approximately 15 Hounsfield unit density. No axillary lymphadenopathy. No supraclavicular lymphadenopathy. Lungs/Pleura: Diminished septal thickening and nodularity in the RIGHT chest in the RIGHT upper lobe in particular. Resolution of much of the septal thickening that was seen on the prior study with diminished RIGHT-sided pleural fluid, still with small RIGHT pleural fluid collection. Thickness of pleural fluid 2.5 cm as compared to 2.9 cm. RIGHT upper lobe pulmonary nodules, dominant nodule measuring 12 mm greatest dimension, previously  15 mm. These areas are more linear on today's exam than they were previously. Areas of amorphous ground-glass attenuation and developed in the RIGHT middle lobe since the prior study largest at 16 x 9 mm. Another small area in the lateral RIGHT middle lobe both seen on image 75 of series 4. Scarring along the medial RIGHT lower lobe. Airways are patent. Musculoskeletal: See below for full musculoskeletal detail. No acute bone finding or destructive bone process related to the bony thorax. Spinal degenerative changes. CT ABDOMEN PELVIS FINDINGS Hepatobiliary: Decreasing size of hepatic metastatic lesions following the improvement in the chest. This is particularly true when compared to the MRI from April. A lesion in the anteromedial LEFT hepatic lobe measuring approximately 8 mm previously 17 mm. A lesion in the caudate lobe (image 59, series 2) 10 mm previously 21 mm. Some of the lesions that were seen on the previous MRI are in visible on the current CT suspect this is a combination of technical factors and disease improvement. No signs of new disease in the liver. Portal vein is patent. No pericholecystic stranding. No  biliary duct dilation. Pancreas: Pancreas is normal without ductal dilation, inflammation or focal lesion. Spleen: Spleen normal in size and contour. Adrenals/Urinary Tract: Adrenal glands are normal. RIGHT renal cysts unchanged. Otherwise with symmetric renal enhancement and no sign of hydronephrosis. Urinary bladder is normal. Stomach/Bowel: Stomach is under distended limiting assessment. Small bowel normal caliber without perienteric stranding. Normal appendix. Colon without signs of inflammation. Colonic diverticulosis. Vascular/Lymphatic: Calcified and noncalcified atheromatous plaque of the abdominal aorta. No aneurysmal dilation. No adenopathy. No pelvic lymphadenopathy. Reproductive: Heterogeneous prostate, nonspecific finding. Unchanged from prior imaging. Prostate is enlarged as well. Other: No ascites.  No peritoneal nodularity. Musculoskeletal: Spinal degenerative changes. No acute or destructive bone process. IMPRESSION: 1. Improvement of disease in the chest as described. 2. Areas of amorphous ground-glass attenuation and developed in the RIGHT middle lobe since the prior study. These areas are more linear on today's exam than they were previously. Findings are nonspecific and could be due to an infectious or inflammatory process, attention on follow-up. 3. Improving disease in the liver without new lesion. 4. Diminished thrombus in LEFT pulmonary arterial branches as best as can be evaluated on venous phase evaluation certainly less clot burden than on the June CT angiography evaluation. 5. Aortic atherosclerosis. Aortic Atherosclerosis (ICD10-I70.0). Electronically Signed   By: Zetta Bills M.D.   On: 07/12/2020 16:02   CT Abdomen Pelvis W Contrast  Result Date: 07/12/2020 CLINICAL DATA:  Restaging of lung cancer with ongoing systemic therapy, history of RIGHT lung cancer, small cell. Shown to have contralateral mediastinal and RIGHT supraclavicular adenopathy in addition to hepatic metastatic  disease EXAM: CT CHEST, ABDOMEN, AND PELVIS WITH CONTRAST TECHNIQUE: Multidetector CT imaging of the chest, abdomen and pelvis was performed following the standard protocol during bolus administration of intravenous contrast. CONTRAST:  168m OMNIPAQUE IOHEXOL 300 MG/ML  SOLN COMPARISON:  June 05, 2020 and multiple prior studies. FINDINGS: CT CHEST FINDINGS Cardiovascular: Central filling defect in LEFT lower lobe pulmonary artery, diminished thrombus grossly when compared to previous imaging in this patient with history of pulmonary emboli previously showing occlusive thrombus in this area. Now with improvement. RIGHT Port-A-Cath in situ. Heart size is normal. No pericardial effusion. Atheromatous plaque in the thoracic aorta. No aneurysmal dilation. Mediastinum/Nodes: Marked decrease in size of RIGHT paratracheal, subcarinal and mediastinal/hilar soft tissue. At the level of the RIGHT mainstem bronchus/upper lobe bronchial origin this measures approximately  4.6 x 2.6 cm previously in this location this measured approximately 6.1 x 3.6 cm. Subcarinal adenopathy is no longer confluent with this area measuring 13 mm as compared to 21 mm short axis (image 28, series 2) RIGHT paratracheal lymph nodes continue to improve with similar changes. Low-density structure along the anterior RIGHT heart border is stable over time perhaps representing a small pericardial cyst. This is lobulated in shows well-circumscribed margins with approximately 15 Hounsfield unit density. No axillary lymphadenopathy. No supraclavicular lymphadenopathy. Lungs/Pleura: Diminished septal thickening and nodularity in the RIGHT chest in the RIGHT upper lobe in particular. Resolution of much of the septal thickening that was seen on the prior study with diminished RIGHT-sided pleural fluid, still with small RIGHT pleural fluid collection. Thickness of pleural fluid 2.5 cm as compared to 2.9 cm. RIGHT upper lobe pulmonary nodules, dominant nodule  measuring 12 mm greatest dimension, previously 15 mm. These areas are more linear on today's exam than they were previously. Areas of amorphous ground-glass attenuation and developed in the RIGHT middle lobe since the prior study largest at 16 x 9 mm. Another small area in the lateral RIGHT middle lobe both seen on image 75 of series 4. Scarring along the medial RIGHT lower lobe. Airways are patent. Musculoskeletal: See below for full musculoskeletal detail. No acute bone finding or destructive bone process related to the bony thorax. Spinal degenerative changes. CT ABDOMEN PELVIS FINDINGS Hepatobiliary: Decreasing size of hepatic metastatic lesions following the improvement in the chest. This is particularly true when compared to the MRI from April. A lesion in the anteromedial LEFT hepatic lobe measuring approximately 8 mm previously 17 mm. A lesion in the caudate lobe (image 59, series 2) 10 mm previously 21 mm. Some of the lesions that were seen on the previous MRI are in visible on the current CT suspect this is a combination of technical factors and disease improvement. No signs of new disease in the liver. Portal vein is patent. No pericholecystic stranding. No biliary duct dilation. Pancreas: Pancreas is normal without ductal dilation, inflammation or focal lesion. Spleen: Spleen normal in size and contour. Adrenals/Urinary Tract: Adrenal glands are normal. RIGHT renal cysts unchanged. Otherwise with symmetric renal enhancement and no sign of hydronephrosis. Urinary bladder is normal. Stomach/Bowel: Stomach is under distended limiting assessment. Small bowel normal caliber without perienteric stranding. Normal appendix. Colon without signs of inflammation. Colonic diverticulosis. Vascular/Lymphatic: Calcified and noncalcified atheromatous plaque of the abdominal aorta. No aneurysmal dilation. No adenopathy. No pelvic lymphadenopathy. Reproductive: Heterogeneous prostate, nonspecific finding. Unchanged from  prior imaging. Prostate is enlarged as well. Other: No ascites.  No peritoneal nodularity. Musculoskeletal: Spinal degenerative changes. No acute or destructive bone process. IMPRESSION: 1. Improvement of disease in the chest as described. 2. Areas of amorphous ground-glass attenuation and developed in the RIGHT middle lobe since the prior study. These areas are more linear on today's exam than they were previously. Findings are nonspecific and could be due to an infectious or inflammatory process, attention on follow-up. 3. Improving disease in the liver without new lesion. 4. Diminished thrombus in LEFT pulmonary arterial branches as best as can be evaluated on venous phase evaluation certainly less clot burden than on the June CT angiography evaluation. 5. Aortic atherosclerosis. Aortic Atherosclerosis (ICD10-I70.0). Electronically Signed   By: Zetta Bills M.D.   On: 07/12/2020 16:02    ASSESSMENT AND PLAN: This is a very pleasant 69 years old white male recently diagnosed with extensive stage (T4, N3, M1 C) small cell  lung cancer presented with large right upper lobe lung mass in addition to mediastinal and right supraclavicular lymphadenopathy and multiple metastatic liver lesions diagnosed in April 2021. The patient is currently undergoing systemic chemotherapy with carboplatin for AUC of 5 on day 1, etoposide 100 mg/M2 on days 1, 2 and 3 in addition to Cosela for myeloprotection during the days of the chemotherapy.  He will also receive immunotherapy with Imfinzi on day one of the chemotherapy cycle.  Status post 4 cycles.   The patient tolerated the previous systemic chemotherapy fairly well with no concerning adverse effects. He had repeat CT scan of the chest, abdomen pelvis performed recently.  I personally and independently reviewed the scans and discussed the results with the patient and his wife. His scan showed no concerning findings for disease progression. I recommended for the patient to  proceed with the first dose of his maintenance treatment with immunotherapy with Imfinzi 1300 mg IV every 4 weeks. He will come back for follow-up visit in 4 weeks for evaluation before the next cycle of his treatment. The patient was advised to call immediately if he has any concerning symptoms in the interval. The patient voices understanding of current disease status and treatment options and is in agreement with the current care plan.  All questions were answered. The patient knows to call the clinic with any problems, questions or concerns. We can certainly see the patient much sooner if necessary.  The total time spent in the appointment was 50 minutes.  Disclaimer: This note was dictated with voice recognition software. Similar sounding words can inadvertently be transcribed and may not be corrected upon review.

## 2020-07-17 NOTE — Progress Notes (Signed)
I spoke with patient and his wife today.  He is doing well without complaints.  He celebrated his birthday recently and enjoyed his time.  He verbalize understands treatment plan.

## 2020-07-17 NOTE — Patient Instructions (Signed)
[facility] Discharge Instructions for Patients Receiving Chemotherapy  Today you received the following chemotherapy agents: Imfinzi.  To help prevent nausea and vomiting after your treatment, we encourage you to take your nausea medication as directed.   If you develop nausea and vomiting that is not controlled by your nausea medication, call the clinic.   BELOW ARE SYMPTOMS THAT SHOULD BE REPORTED IMMEDIATELY:  *FEVER GREATER THAN 100.5 F  *CHILLS WITH OR WITHOUT FEVER  NAUSEA AND VOMITING THAT IS NOT CONTROLLED WITH YOUR NAUSEA MEDICATION  *UNUSUAL SHORTNESS OF BREATH  *UNUSUAL BRUISING OR BLEEDING  TENDERNESS IN MOUTH AND THROAT WITH OR WITHOUT PRESENCE OF ULCERS  *URINARY PROBLEMS  *BOWEL PROBLEMS  UNUSUAL RASH Items with * indicate a potential emergency and should be followed up as soon as possible.  Feel free to call the clinic should you have any questions or concerns. The clinic phone number is [phone].  Please show the CHEMO ALERT CARD at check-in to the Emergency Department and triage nurse.   

## 2020-07-19 ENCOUNTER — Other Ambulatory Visit: Payer: Self-pay | Admitting: Primary Care

## 2020-07-19 DIAGNOSIS — E119 Type 2 diabetes mellitus without complications: Secondary | ICD-10-CM

## 2020-07-19 DIAGNOSIS — E1169 Type 2 diabetes mellitus with other specified complication: Secondary | ICD-10-CM

## 2020-07-19 DIAGNOSIS — I10 Essential (primary) hypertension: Secondary | ICD-10-CM

## 2020-07-19 NOTE — Telephone Encounter (Signed)
Patient's wife called in stating the pharmacy sent refill request for medications and they would like it filled today as they are traveling tomorrow. Please advise if possible.

## 2020-08-10 NOTE — Progress Notes (Signed)
[facility] OFFICE PROGRESS NOTE  [name], NP [place] [address]  DIAGNOSIS: Extensive stage (T4, N3, M1c)small cell lung cancer diagnosed in April 2021 and presented with large right upper lobe/right hilar mass with ipsilateral and contralateral mediastinal and right supraclavicular lymphadenopathy in addition to multiple liver lesions  PRIOR THERAPY: Palliative radiotherapy to the right upper lobe lung mass under the care of Dr. Lisbeth Renshaw  CURRENT THERAPY: Systemic chemotherapy with carboplatin for AUC of 5 on day 1, etoposide 100 mg/M2 on days 1, 2 and 3 in addition to Imfinzi 1500 mg IV every 3 weeks with chemotherapy then every 4 weeks for maintenance if the patient has no evidence for progression. He will also receive Cosela 240 mg/m2 on the days of the chemotherapy. Status post5cycles.He will receive neulasta for cycle #3 due to neutropenia.Starting from cycle #5, he is on maintenance single agent immunotherapy with Imfinzi  INTERVAL HISTORY: Adam Wise 69 y.o. male returns to the clinic for a follow up visit accompanied by his wife. The patient is feeling well today without any concerning complaints except some occasional ratting in his chest and baseline dyspnea on exertion. He reports his baseline dry cough. He denies any associated fevers, chills, night sweats, nasal congestion, sore throat, or sick contacts. The patient continues to tolerate maintenance immunotherapy well without any concerning complaints. Denies any weight loss. Denies any chest pain or hemoptysis. Denies any nausea, vomiting, diarrhea, or constipation. Denies any headache or visual changes. He is inquiring if he would be eligible for a handicap placard for his car since he gets short of breath with exertion. Denies any rashes or skin changes. The patient is here today for evaluation prior to starting cycle # 6.  MEDICAL HISTORY: Past Medical History:  Diagnosis Date   . Cancer ([place])   . Chickenpox   . Chronic knee pain   . Chronic low back pain   . Essential hypertension   . GERD (gastroesophageal reflux disease)   . OSA (obstructive sleep apnea)   . Testosterone deficiency   . Type 2 diabetes mellitus ([facility])     ALLERGIES:  is allergic to bupropion.  MEDICATIONS:  Current Outpatient Medications  Medication Sig Dispense Refill  . acetaminophen (TYLENOL) 500 MG tablet Take 1,500 mg by mouth every 8 (eight) hours as needed for moderate pain or headache.    . albuterol (VENTOLIN HFA) 108 (90 Base) MCG/ACT inhaler Inhale 2 puffs into the lungs every 6 (six) hours as needed for wheezing or shortness of breath. 18 g 0  . amLODipine (NORVASC) 10 MG tablet TAKE 1 TABLET BY MOUTH EVERY DAY 90 tablet 1  . apixaban (ELIQUIS) 5 MG TABS tablet Take 1 tablet (5 mg total) by mouth 2 (two) times daily. 60 tablet 2  . Black Pepper-Turmeric (TURMERIC COMPLEX/BLACK PEPPER PO) Take 1 tablet by mouth in the morning and at bedtime.    . blood glucose meter kit and supplies KIT Dispense based on patient and insurance preference. Use up to four times daily as directed. 1 each 0  . chlorpheniramine-HYDROcodone (TUSSIONEX) 10-8 MG/5ML SUER Take 5 mLs by mouth at bedtime as needed for cough. 140 mL 0  . Coenzyme Q10 ([employer] COQ-10) 200 MG capsule Take 200 mg by mouth every evening.     Marland Kitchen esomeprazole (NEXIUM) 20 MG capsule Take 20 mg by mouth daily.     . fluticasone (FLONASE) 50 MCG/ACT nasal spray Place 2 sprays into both nostrils  daily as needed for allergies.     Marland Kitchen gabapentin (NEURONTIN) 300 MG capsule Take 300 mg by mouth 2 (two) times daily.     Marland Kitchen glipiZIDE (GLUCOTROL) 10 MG tablet TAKE 1 TABLET (10 MG TOTAL) BY MOUTH 2 (TWO) TIMES DAILY BEFORE A MEAL. FOR DIABETES. 180 tablet 2  . KRILL OIL PO Take 350 mg by mouth daily.    . Lancets MISC USE UP TO 3 TIMES DAILY AS DIRECTED 100 each 2  . lidocaine-prilocaine (EMLA) cream Apply 1 application topically as needed. (Patient  taking differently: Apply 1 application topically as needed (access port). ) 30 g 0  . lisinopril (ZESTRIL) 5 MG tablet TAKE 1 TABLET BY MOUTH EVERY DAY 90 tablet 1  . Melatonin 10 MG CAPS Take 10 mg by mouth at bedtime.    . metFORMIN (GLUCOPHAGE) 1000 MG tablet TAKE 1 TABLET (1,000 MG TOTAL) BY MOUTH 2 (TWO) TIMES DAILY WITH A MEAL. 180 tablet 1  . Multiple Vitamin (MULTI-VITAMINS) TABS Take 1 tablet by mouth daily.     Glory Rosebush ULTRA test strip USE UP TO 4 TIMES DAILY AS DIRECTED 100 strip 5  . oxymetazoline (AFRIN) 0.05 % nasal spray Place 1 spray into both nostrils 2 (two) times daily as needed for congestion.    . pioglitazone (ACTOS) 45 MG tablet TAKE 1 TABLET BY MOUTH EVERY DAY 90 tablet 1  . pravastatin (PRAVACHOL) 40 MG tablet TAKE 1 TABLET BY MOUTH EVERY DAY IN THE EVENING FOR CHOLESTEROL 90 tablet 1  . prochlorperazine (COMPAZINE) 10 MG tablet Take 1 tablet (10 mg total) by mouth every 6 (six) hours as needed for nausea or vomiting. 30 tablet 0  . sildenafil (VIAGRA) 50 MG tablet TAKE 1 TABLET BY MOUTH EVERY DAY AS NEEDED 12 tablet 1  . SYRINGE-NEEDLE, DISP, 3 ML (B-D 3CC LUER-LOK SYR 22GX1") 22G X 1" 3 ML MISC USE AS INSTRUCTED FOR TESTOSTERONE INJECTION EVERY 2 WEEKS 10 each 2  . testosterone cypionate (DEPOTESTOSTERONE CYPIONATE) 200 MG/ML injection Inject 200 mg into the muscle every 14 (fourteen) days.    Marland Kitchen HYDROcodone-homatropine (HYCODAN) 5-1.5 MG/5ML syrup Take 5 mLs by mouth at bedtime as needed for cough. (Patient not taking: Reported on 06/04/2020)    . sucralfate (CARAFATE) 1 g tablet DISSOLVE ONE TAB IN 15ML'S OF WATER AND TAKE BY MOUTH FOUR TIMES A DAY AS DIRECTED (Patient not taking: Reported on 08/14/2020) 120 tablet 0   No current facility-administered medications for this visit.   Facility-Administered Medications Ordered in Other Visits  Medication Dose Route Frequency Provider Last Rate Last Admin  . durvalumab (IMFINZI) 1,500 mg in sodium chloride 0.9 % 100 mL  chemo infusion  1,500 mg Intravenous Once Curt Bears, MD      . heparin lock flush 100 unit/mL  500 Units Intracatheter Once PRN Curt Bears, MD      . sodium chloride flush (NS) 0.9 % injection 10 mL  10 mL Intracatheter PRN Curt Bears, MD        SURGICAL HISTORY:  Past Surgical History:  Procedure Laterality Date  . COLONOSCOPY WITH PROPOFOL N/A 12/17/2018   Procedure: COLONOSCOPY WITH PROPOFOL;  Surgeon: Virgel Manifold, MD;  Location: [facility] ENDOSCOPY;  Service: Gastroenterology;  Laterality: N/A;  . IR IMAGING GUIDED PORT INSERTION  04/17/2020  . JOINT REPLACEMENT Bilateral   . REPLACEMENT TOTAL KNEE BILATERAL  2015  . TONSILLECTOMY  1960    REVIEW OF SYSTEMS:   Review of Systems  Constitutional: Negative for  appetite change, chills, fatigue, fever and unexpected weight change.  HENT: Negative for mouth sores, nosebleeds, sore throat and trouble swallowing.   Eyes: Negative for eye problems and icterus.  Respiratory: Positive for dyspnea on exertion and mild dry cough. Negative for hemoptysis,  and wheezing.   Cardiovascular: Negative for chest pain and leg swelling.  Gastrointestinal: Negative for abdominal pain, constipation, diarrhea, nausea and vomiting.  Genitourinary: Negative for bladder incontinence, difficulty urinating, dysuria, frequency and hematuria.   Musculoskeletal: Negative for back pain, gait problem, neck pain and neck stiffness.  Skin: Negative for itching and rash.  Neurological: Negative for dizziness, extremity weakness, gait problem, headaches, light-headedness and seizures.  Hematological: Negative for adenopathy. Does not bruise/bleed easily.  Psychiatric/Behavioral: Negative for confusion, depression and sleep disturbance. The patient is not nervous/anxious.      PHYSICAL EXAMINATION:  Blood pressure 135/79, pulse (!) 101, temperature 98.1 F (36.7 C), temperature source Tympanic, resp. rate 18, height _0  (1.753 m), weight 271 lb  12.8 oz (123.3 kg), SpO2 96 %.  ECOG PERFORMANCE STATUS: 1 - Symptomatic but completely ambulatory  Physical Exam  Constitutional: Oriented to person, place, and time and well-developed, well-nourished, and in no distress. HENT:  Head: Normocephalic and atraumatic.  Mouth/Throat: Oropharynx is clear and moist. No oropharyngeal exudate.  Eyes: Conjunctivae are normal. Right eye exhibits no discharge. Left eye exhibits no discharge. No scleral icterus.  Neck: Normal range of motion. Neck supple.  Cardiovascular: Normal rate, regular rhythm, normal heart sounds and intact distal pulses.   Pulmonary/Chest: Effort normal and breath sounds normal. No respiratory distress. No wheezes. No rales.  Abdominal: Soft. Bowel sounds are normal. Exhibits no distension and no mass. There is no tenderness.  Musculoskeletal: Normal range of motion. Exhibits no edema.  Lymphadenopathy:    No cervical adenopathy.  Neurological: Alert and oriented to person, place, and time. Exhibits normal muscle tone. Gait normal. Coordination normal.  Skin: Skin is warm and dry. No rash noted. Not diaphoretic. No erythema. No pallor.  Psychiatric: Mood, memory and judgment normal.  Vitals reviewed.  LABORATORY DATA: Lab Results  Component Value Date   WBC 5.1 08/14/2020   HGB 12.7 (L) 08/14/2020   HCT 39.9 08/14/2020   MCV 95.0 08/14/2020   PLT 186 08/14/2020      Chemistry      Component Value Date/Time   NA 138 08/14/2020 0932   K 4.4 08/14/2020 0932   CL 103 08/14/2020 0932   CO2 29 08/14/2020 0932   BUN 14 08/14/2020 0932   CREATININE 1.13 08/14/2020 0932      Component Value Date/Time   CALCIUM 9.7 08/14/2020 0932   ALKPHOS 77 08/14/2020 0932   AST 13 (L) 08/14/2020 0932   ALT 10 08/14/2020 0932   BILITOT 0.3 08/14/2020 0932       RADIOGRAPHIC STUDIES:  No results found.   ASSESSMENT/PLAN:  This is a very pleasant 53 year oldCaucasianmale recently diagnosed with extensive stage (T4, N3,  M1 C) small cell lung cancer presented with large right upper lobe lung mass in addition to mediastinal and right supraclavicular lymphadenopathy and multiple metastatic liver lesions diagnosed in April 2021.  The patient is currently undergoing systemic chemotherapy with carboplatin for an AUC 5 on day 1, etoposide 100 mg/m2 on days 1, 2, and 3 in addition to Cosela for myeloprotection during the days of chemotherapy. He also will receive immunotherapy with Imfinzi on day 1 of chemotherapy cycle. He is status post 5 cycles.Starting from cycle #  5, he has been on maintenance single agent immunotherapy with Imfinzi.  Labs were reviewed. Recommend that he proceed with cycle #6 today as scheduled.   I will arrange for a restaging CT scan of this chest, abdomen, and pelvis prior to his next cycle of treatment. We can evaluate his mild dyspnea on exertion on his upcoming scan. He is afebrile and non toxic appearing. No wheezing, crackles, or rhonchi noted on exam. No fevers, chills, significant cough, or nasal congestion/sore throat. No leukocytosis. Discussed if he develops symptoms of infection that he can always be seen sooner than his scheduled CT scan which is not until ~9/23.   We will see him back for a follow up visit in 4 weeks for evaluation and to review his scan before staring cycle #7.   He will continue oneliquisfor his history of pulmonary embolism. He is compliant with this medication.   The patient inquired about a handicap placard which is reasonable given his age and extensive stage lung cancer and dyspnea on exertion. He was provided this paperwork today.   The patient was advised to call immediately if he has any concerning symptoms in the interval. The patient voices understanding of current disease status and treatment options and is in agreement with the current care plan. All questions were answered. The patient knows to call the clinic with any problems, questions or  concerns. We can certainly see the patient much sooner if necessary Orders Placed This Encounter  Procedures  . CT CHEST ABDOMEN PELVIS W CONTRAST    Standing Status:   Future    Standing Expiration Date:   08/14/2021    Order Specific Question:   Reason for Exam (SYMPTOM  OR DIAGNOSIS REQUIRED)    Answer:   Restaging Small Cell Lung Cancer    Order Specific Question:   Preferred imaging location?    Answer:   [facility]    Order Specific Question:   Radiology Contrast Protocol - do NOT remove file path    Answer:   [email or web address]     [place], PA-C 08/14/20

## 2020-08-13 ENCOUNTER — Ambulatory Visit: Payer: Medicare Other

## 2020-08-14 ENCOUNTER — Inpatient Hospital Stay: Payer: Medicare Other

## 2020-08-14 ENCOUNTER — Other Ambulatory Visit: Payer: Self-pay

## 2020-08-14 ENCOUNTER — Inpatient Hospital Stay (HOSPITAL_BASED_OUTPATIENT_CLINIC_OR_DEPARTMENT_OTHER): Payer: Medicare Other | Admitting: Physician Assistant

## 2020-08-14 VITALS — HR 99

## 2020-08-14 VITALS — BP 135/79 | HR 101 | Temp 98.1°F | Resp 18 | Ht 69.0 in | Wt 271.8 lb

## 2020-08-14 DIAGNOSIS — C3411 Malignant neoplasm of upper lobe, right bronchus or lung: Secondary | ICD-10-CM

## 2020-08-14 DIAGNOSIS — Z95828 Presence of other vascular implants and grafts: Secondary | ICD-10-CM

## 2020-08-14 DIAGNOSIS — Z5112 Encounter for antineoplastic immunotherapy: Secondary | ICD-10-CM

## 2020-08-14 DIAGNOSIS — R5382 Chronic fatigue, unspecified: Secondary | ICD-10-CM

## 2020-08-14 LAB — CBC WITH DIFFERENTIAL (CANCER CENTER ONLY)
Abs Immature Granulocytes: 0.02 10*3/uL (ref 0.00–0.07)
Basophils Absolute: 0 10*3/uL (ref 0.0–0.1)
Basophils Relative: 1 %
Eosinophils Absolute: 0.2 10*3/uL (ref 0.0–0.5)
Eosinophils Relative: 4 %
HCT: 39.9 % (ref 39.0–52.0)
Hemoglobin: 12.7 g/dL — ABNORMAL LOW (ref 13.0–17.0)
Immature Granulocytes: 0 %
Lymphocytes Relative: 10 %
Lymphs Abs: 0.5 10*3/uL — ABNORMAL LOW (ref 0.7–4.0)
MCH: 30.2 pg (ref 26.0–34.0)
MCHC: 31.8 g/dL (ref 30.0–36.0)
MCV: 95 fL (ref 80.0–100.0)
Monocytes Absolute: 0.4 10*3/uL (ref 0.1–1.0)
Monocytes Relative: 8 %
Neutro Abs: 3.9 10*3/uL (ref 1.7–7.7)
Neutrophils Relative %: 77 %
Platelet Count: 186 10*3/uL (ref 150–400)
RBC: 4.2 MIL/uL — ABNORMAL LOW (ref 4.22–5.81)
RDW: 13.3 % (ref 11.5–15.5)
WBC Count: 5.1 10*3/uL (ref 4.0–10.5)
nRBC: 0 % (ref 0.0–0.2)

## 2020-08-14 LAB — CMP (CANCER CENTER ONLY)
ALT: 10 U/L (ref 0–44)
AST: 13 U/L — ABNORMAL LOW (ref 15–41)
Albumin: 3.6 g/dL (ref 3.5–5.0)
Alkaline Phosphatase: 77 U/L (ref 38–126)
Anion gap: 6 (ref 5–15)
BUN: 14 mg/dL (ref 8–23)
CO2: 29 mmol/L (ref 22–32)
Calcium: 9.7 mg/dL (ref 8.9–10.3)
Chloride: 103 mmol/L (ref 98–111)
Creatinine: 1.13 mg/dL (ref 0.61–1.24)
GFR, Est AFR Am: >60 mL/min (ref >60)
GFR, Estimated: >60 mL/min (ref >60)
Glucose, Bld: 211 mg/dL — ABNORMAL HIGH (ref 70–99)
Potassium: 4.4 mmol/L (ref 3.5–5.1)
Sodium: 138 mmol/L (ref 135–145)
Total Bilirubin: 0.3 mg/dL (ref 0.3–1.2)
Total Protein: 6.4 g/dL — ABNORMAL LOW (ref 6.5–8.1)

## 2020-08-14 LAB — TSH: TSH: 0.591 u[IU]/mL (ref 0.320–4.118)

## 2020-08-14 MED ORDER — HEPARIN SOD (PORK) LOCK FLUSH 100 UNIT/ML IV SOLN
500.0000 [IU] | Freq: Once | INTRAVENOUS | Status: AC | PRN
  Administered 2020-08-14: 500 [IU]
  Filled 2020-08-14: qty 5

## 2020-08-14 MED ORDER — SODIUM CHLORIDE 0.9 % IV SOLN
1500.0000 mg | Freq: Once | INTRAVENOUS | Status: AC
  Administered 2020-08-14: 1500 mg via INTRAVENOUS
  Filled 2020-08-14: qty 30

## 2020-08-14 MED ORDER — SODIUM CHLORIDE 0.9 % IV SOLN
Freq: Once | INTRAVENOUS | Status: AC
  Filled 2020-08-14: qty 250

## 2020-08-14 MED ORDER — SODIUM CHLORIDE 0.9% FLUSH
10.0000 mL | INTRAVENOUS | Status: DC | PRN
  Administered 2020-08-14: 10 mL
  Filled 2020-08-14: qty 10

## 2020-08-14 MED ORDER — SODIUM CHLORIDE 0.9% FLUSH
10.0000 mL | Freq: Once | INTRAVENOUS | Status: AC
  Administered 2020-08-14: 10 mL
  Filled 2020-08-14: qty 10

## 2020-08-14 NOTE — Patient Instructions (Signed)
[facility] Discharge Instructions for Patients Receiving Chemotherapy  Today you received the following chemotherapy agents: Imfinzi.  To help prevent nausea and vomiting after your treatment, we encourage you to take your nausea medication as directed.   If you develop nausea and vomiting that is not controlled by your nausea medication, call the clinic.   BELOW ARE SYMPTOMS THAT SHOULD BE REPORTED IMMEDIATELY:  *FEVER GREATER THAN 100.5 F  *CHILLS WITH OR WITHOUT FEVER  NAUSEA AND VOMITING THAT IS NOT CONTROLLED WITH YOUR NAUSEA MEDICATION  *UNUSUAL SHORTNESS OF BREATH  *UNUSUAL BRUISING OR BLEEDING  TENDERNESS IN MOUTH AND THROAT WITH OR WITHOUT PRESENCE OF ULCERS  *URINARY PROBLEMS  *BOWEL PROBLEMS  UNUSUAL RASH Items with * indicate a potential emergency and should be followed up as soon as possible.  Feel free to call the clinic should you have any questions or concerns. The clinic phone number is [phone].  Please show the CHEMO ALERT CARD at check-in to the Emergency Department and triage nurse.   

## 2020-08-21 ENCOUNTER — Telehealth: Payer: Self-pay | Admitting: Physician Assistant

## 2020-08-21 NOTE — Telephone Encounter (Signed)
Scheduled per 08/31 los, patient has been called and notified of upcoming appointments.

## 2020-08-26 ENCOUNTER — Other Ambulatory Visit: Payer: Self-pay | Admitting: Primary Care

## 2020-08-26 DIAGNOSIS — E119 Type 2 diabetes mellitus without complications: Secondary | ICD-10-CM

## 2020-09-03 ENCOUNTER — Other Ambulatory Visit: Payer: Self-pay | Admitting: Physician Assistant

## 2020-09-03 DIAGNOSIS — I2692 Saddle embolus of pulmonary artery without acute cor pulmonale: Secondary | ICD-10-CM

## 2020-09-06 ENCOUNTER — Ambulatory Visit (HOSPITAL_COMMUNITY)
Admission: RE | Admit: 2020-09-06 | Discharge: 2020-09-06 | Disposition: A | Discharge status: Home / Self Care | Payer: Medicare Other | Source: Ambulatory Visit | Attending: Physician Assistant | Admitting: Physician Assistant

## 2020-09-06 ENCOUNTER — Other Ambulatory Visit: Payer: Self-pay

## 2020-09-06 DIAGNOSIS — C3411 Malignant neoplasm of upper lobe, right bronchus or lung: Secondary | ICD-10-CM

## 2020-09-06 MED ORDER — HEPARIN SOD (PORK) LOCK FLUSH 100 UNIT/ML IV SOLN
500.0000 [IU] | Freq: Once | INTRAVENOUS | Status: AC
  Administered 2020-09-06: 500 [IU] via INTRAVENOUS

## 2020-09-06 MED ORDER — IOHEXOL 300 MG/ML  SOLN
100.0000 mL | Freq: Once | INTRAMUSCULAR | Status: AC | PRN
  Administered 2020-09-06: 100 mL via INTRAVENOUS

## 2020-09-06 MED ORDER — HEPARIN SOD (PORK) LOCK FLUSH 100 UNIT/ML IV SOLN
INTRAVENOUS | Status: AC
  Filled 2020-09-06: qty 5

## 2020-09-11 ENCOUNTER — Inpatient Hospital Stay: Payer: Medicare Other

## 2020-09-11 ENCOUNTER — Other Ambulatory Visit: Payer: Self-pay

## 2020-09-11 ENCOUNTER — Inpatient Hospital Stay: Payer: Medicare Other | Attending: Internal Medicine

## 2020-09-11 ENCOUNTER — Encounter: Payer: Self-pay | Admitting: Internal Medicine

## 2020-09-11 ENCOUNTER — Inpatient Hospital Stay (HOSPITAL_BASED_OUTPATIENT_CLINIC_OR_DEPARTMENT_OTHER): Payer: Medicare Other | Admitting: Internal Medicine

## 2020-09-11 VITALS — BP 139/95 | HR 96 | Temp 97.2°F | Resp 19 | Ht 69.0 in | Wt 275.8 lb

## 2020-09-11 DIAGNOSIS — C3411 Malignant neoplasm of upper lobe, right bronchus or lung: Secondary | ICD-10-CM

## 2020-09-11 DIAGNOSIS — Z95828 Presence of other vascular implants and grafts: Secondary | ICD-10-CM

## 2020-09-11 DIAGNOSIS — Z7901 Long term (current) use of anticoagulants: Secondary | ICD-10-CM | POA: Insufficient documentation

## 2020-09-11 DIAGNOSIS — R0602 Shortness of breath: Secondary | ICD-10-CM

## 2020-09-11 DIAGNOSIS — C787 Secondary malignant neoplasm of liver and intrahepatic bile duct: Secondary | ICD-10-CM | POA: Insufficient documentation

## 2020-09-11 DIAGNOSIS — Z452 Encounter for adjustment and management of vascular access device: Secondary | ICD-10-CM | POA: Diagnosis not present

## 2020-09-11 DIAGNOSIS — Z79899 Other long term (current) drug therapy: Secondary | ICD-10-CM | POA: Insufficient documentation

## 2020-09-11 DIAGNOSIS — R5382 Chronic fatigue, unspecified: Secondary | ICD-10-CM

## 2020-09-11 DIAGNOSIS — I2699 Other pulmonary embolism without acute cor pulmonale: Secondary | ICD-10-CM | POA: Insufficient documentation

## 2020-09-11 DIAGNOSIS — I10 Essential (primary) hypertension: Secondary | ICD-10-CM

## 2020-09-11 DIAGNOSIS — J90 Pleural effusion, not elsewhere classified: Secondary | ICD-10-CM | POA: Diagnosis not present

## 2020-09-11 DIAGNOSIS — Z5112 Encounter for antineoplastic immunotherapy: Secondary | ICD-10-CM

## 2020-09-11 LAB — CMP (CANCER CENTER ONLY)
ALT: 14 U/L (ref 0–44)
AST: 15 U/L (ref 15–41)
Albumin: 3.4 g/dL — ABNORMAL LOW (ref 3.5–5.0)
Alkaline Phosphatase: 61 U/L (ref 38–126)
Anion gap: 4 — ABNORMAL LOW (ref 5–15)
BUN: 15 mg/dL (ref 8–23)
CO2: 30 mmol/L (ref 22–32)
Calcium: 9.1 mg/dL (ref 8.9–10.3)
Chloride: 104 mmol/L (ref 98–111)
Creatinine: 1.13 mg/dL (ref 0.61–1.24)
GFR, Est AFR Am: >60 mL/min (ref >60)
GFR, Estimated: >60 mL/min (ref >60)
Glucose, Bld: 154 mg/dL — ABNORMAL HIGH (ref 70–99)
Potassium: 4.5 mmol/L (ref 3.5–5.1)
Sodium: 138 mmol/L (ref 135–145)
Total Bilirubin: 0.3 mg/dL (ref 0.3–1.2)
Total Protein: 6.6 g/dL (ref 6.5–8.1)

## 2020-09-11 LAB — CBC WITH DIFFERENTIAL (CANCER CENTER ONLY)
Abs Immature Granulocytes: 0.01 10*3/uL (ref 0.00–0.07)
Basophils Absolute: 0 10*3/uL (ref 0.0–0.1)
Basophils Relative: 1 %
Eosinophils Absolute: 0.1 10*3/uL (ref 0.0–0.5)
Eosinophils Relative: 3 %
HCT: 39.4 % (ref 39.0–52.0)
Hemoglobin: 12.7 g/dL — ABNORMAL LOW (ref 13.0–17.0)
Immature Granulocytes: 0 %
Lymphocytes Relative: 12 %
Lymphs Abs: 0.6 10*3/uL — ABNORMAL LOW (ref 0.7–4.0)
MCH: 29.7 pg (ref 26.0–34.0)
MCHC: 32.2 g/dL (ref 30.0–36.0)
MCV: 92.3 fL (ref 80.0–100.0)
Monocytes Absolute: 0.5 10*3/uL (ref 0.1–1.0)
Monocytes Relative: 11 %
Neutro Abs: 3.5 10*3/uL (ref 1.7–7.7)
Neutrophils Relative %: 73 %
Platelet Count: 177 10*3/uL (ref 150–400)
RBC: 4.27 MIL/uL (ref 4.22–5.81)
RDW: 13 % (ref 11.5–15.5)
WBC Count: 4.8 10*3/uL (ref 4.0–10.5)
nRBC: 0 % (ref 0.0–0.2)

## 2020-09-11 LAB — TSH: TSH: 0.746 u[IU]/mL (ref 0.320–4.118)

## 2020-09-11 MED ORDER — SODIUM CHLORIDE 0.9% FLUSH
10.0000 mL | Freq: Once | INTRAVENOUS | Status: AC
  Administered 2020-09-11: 10 mL
  Filled 2020-09-11: qty 10

## 2020-09-11 MED ORDER — HEPARIN SOD (PORK) LOCK FLUSH 100 UNIT/ML IV SOLN
500.0000 [IU] | Freq: Once | INTRAVENOUS | Status: AC
  Administered 2020-09-11: 500 [IU]
  Filled 2020-09-11: qty 5

## 2020-09-11 MED ORDER — PREDNISONE 20 MG PO TABS: ORAL_TABLET | ORAL | 0 refills | Status: DC

## 2020-09-11 NOTE — Progress Notes (Signed)
[place] [phone]   [phone]  OFFICE PROGRESS NOTE  [name], NP [place] [address]  DIAGNOSIS: Extensive stage (T4, N3, M1c)  small cell lung cancer diagnosed in April 2021 and presented with large right upper lobe/right hilar mass with ipsilateral and contralateral mediastinal and right supraclavicular lymphadenopathy in addition to multiple liver lesios  PRIOR THERAPY: Palliative radiotherapy to the right upper lobe lung mass under the care of Dr. Lisbeth Renshaw  CURRENT THERAPY: Systemic chemotherapy with carboplatin for AUC of 5 on day 1, etoposide 100 mg/M2 on days 1, 2 and 3 in addition to Imfinzi 1500 mg IV every 3 weeks with chemotherapy then every 4 weeks for maintenance if the patient has no evidence for progression.  He will also receive Cosela 240 mg/m2 on the days of the chemotherapy.  Status post 6 cycles.  Starting from cycle #5 the patient will be on maintenance treatment with immunotherapy with Imfinzi 1500 mg IV every 4 weeks.  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  INTERVAL HISTORY: Adam Wise 69 y.o. male returns to the clinic today for follow-up visit accompanied by his wife.  The patient is feeling fine today with no concerning complaints except for persistent shortness of breath as well as cough productive of whitish sputum. He denied having any recent chest pain or hemoptysis.  He has no nausea, vomiting, diarrhea or constipation.  He has no headache or visual changes.  The patient has no significant weight loss or night sweats.  He continues to tolerate his treatment with Imfinzi fairly well except for the shortness of breath. He had repeat CT scan of the chest, abdomen pelvis performed recently and is here for evaluation and discussion of his scan results.  MEDICAL HISTORY: Past Medical History:  Diagnosis Date  . Cancer ([facility])   .  Chickenpox   . Chronic knee pain   . Chronic low back pain   . Essential hypertension   . GERD (gastroesophageal reflux disease)   . OSA (obstructive sleep apnea)   . Testosterone deficiency   . Type 2 diabetes mellitus ([facility])     ALLERGIES:  is allergic to bupropion.  MEDICATIONS:  Current Outpatient Medications  Medication Sig Dispense Refill  . acetaminophen (TYLENOL) 500 MG tablet Take 1,500 mg by mouth every 8 (eight) hours as needed for moderate pain or headache.    . albuterol (VENTOLIN HFA) 108 (90 Base) MCG/ACT inhaler Inhale 2 puffs into the lungs every 6 (six) hours as needed for wheezing or shortness of breath. 18 g 0  . amLODipine (NORVASC) 10 MG tablet TAKE 1 TABLET BY MOUTH EVERY DAY 90 tablet 1  . Black Pepper-Turmeric (TURMERIC COMPLEX/BLACK PEPPER PO) Take 1 tablet by mouth in the morning and at bedtime.    . blood glucose meter kit and supplies KIT Dispense based on patient and insurance preference. Use up to four times daily as directed. 1 each 0  . chlorpheniramine-HYDROcodone (TUSSIONEX) 10-8 MG/5ML SUER Take 5 mLs by mouth at bedtime as needed for cough. 140 mL 0  . Coenzyme Q10 ([employer] COQ-10) 200 MG capsule Take 200 mg by mouth every evening.     Marland Kitchen ELIQUIS 5 MG TABS tablet TAKE 1 TABLET BY MOUTH TWICE A DAY 180 tablet 2  . esomeprazole (NEXIUM) 20 MG capsule Take 20 mg by mouth daily.     . fluticasone (FLONASE) 50 MCG/ACT nasal  spray Place 2 sprays into both nostrils daily as needed for allergies.     Marland Kitchen gabapentin (NEURONTIN) 300 MG capsule Take 300 mg by mouth 2 (two) times daily.     Marland Kitchen glipiZIDE (GLUCOTROL) 10 MG tablet TAKE 1 TABLET (10 MG TOTAL) BY MOUTH 2 (TWO) TIMES DAILY BEFORE A MEAL. FOR DIABETES. 180 tablet 2  . HYDROcodone-homatropine (HYCODAN) 5-1.5 MG/5ML syrup Take 5 mLs by mouth at bedtime as needed for cough. (Patient not taking: Reported on 06/04/2020)    . KRILL OIL PO Take 350 mg by mouth daily.    . Lancets MISC USE UP TO 3 TIMES DAILY AS DIRECTED  100 each 2  . lidocaine-prilocaine (EMLA) cream Apply 1 application topically as needed. (Patient taking differently: Apply 1 application topically as needed (access port). ) 30 g 0  . lisinopril (ZESTRIL) 5 MG tablet TAKE 1 TABLET BY MOUTH EVERY DAY 90 tablet 1  . Melatonin 10 MG CAPS Take 10 mg by mouth at bedtime.    . metFORMIN (GLUCOPHAGE) 1000 MG tablet TAKE 1 TABLET (1,000 MG TOTAL) BY MOUTH 2 (TWO) TIMES DAILY WITH A MEAL. 180 tablet 1  . Multiple Vitamin (MULTI-VITAMINS) TABS Take 1 tablet by mouth daily.     Glory Rosebush ULTRA test strip USE UP TO 4 TIMES DAILY AS DIRECTED 100 strip 5  . oxymetazoline (AFRIN) 0.05 % nasal spray Place 1 spray into both nostrils 2 (two) times daily as needed for congestion.    . pioglitazone (ACTOS) 45 MG tablet TAKE 1 TABLET BY MOUTH EVERY DAY 90 tablet 1  . pravastatin (PRAVACHOL) 40 MG tablet TAKE 1 TABLET BY MOUTH EVERY DAY IN THE EVENING FOR CHOLESTEROL 90 tablet 1  . prochlorperazine (COMPAZINE) 10 MG tablet Take 1 tablet (10 mg total) by mouth every 6 (six) hours as needed for nausea or vomiting. 30 tablet 0  . sildenafil (VIAGRA) 50 MG tablet TAKE 1 TABLET BY MOUTH EVERY DAY AS NEEDED 12 tablet 1  . sucralfate (CARAFATE) 1 g tablet DISSOLVE ONE TAB IN 15ML'S OF WATER AND TAKE BY MOUTH FOUR TIMES A DAY AS DIRECTED (Patient not taking: Reported on 08/14/2020) 120 tablet 0  . SYRINGE-NEEDLE, DISP, 3 ML (B-D 3CC LUER-LOK SYR 22GX1") 22G X 1" 3 ML MISC USE AS INSTRUCTED FOR TESTOSTERONE INJECTION EVERY 2 WEEKS 10 each 2  . testosterone cypionate (DEPOTESTOSTERONE CYPIONATE) 200 MG/ML injection Inject 200 mg into the muscle every 14 (fourteen) days.     No current facility-administered medications for this visit.    SURGICAL HISTORY:  Past Surgical History:  Procedure Laterality Date  . COLONOSCOPY WITH PROPOFOL N/A 12/17/2018   Procedure: COLONOSCOPY WITH PROPOFOL;  Surgeon: Virgel Manifold, MD;  Location: [facility] ENDOSCOPY;  Service: Gastroenterology;   Laterality: N/A;  . IR IMAGING GUIDED PORT INSERTION  04/17/2020  . JOINT REPLACEMENT Bilateral   . REPLACEMENT TOTAL KNEE BILATERAL  2015  . TONSILLECTOMY  1960    REVIEW OF SYSTEMS:  Constitutional: negative Eyes: negative Ears, nose, mouth, throat, and face: negative Respiratory: positive for cough and dyspnea on exertion Cardiovascular: negative Gastrointestinal: negative Genitourinary:negative Integument/breast: negative Hematologic/lymphatic: negative Musculoskeletal:negative Neurological: negative Behavioral/Psych: negative Endocrine: negative Allergic/Immunologic: negative   PHYSICAL EXAMINATION: General appearance: alert, cooperative, fatigued and no distress Head: atraumatic Neck: no adenopathy, no JVD, supple, symmetrical, trachea midline and thyroid not enlarged, symmetric, no tenderness/mass/nodules Lymph nodes: Cervical, supraclavicular, and axillary nodes normal. Resp: diminished breath sounds RLL and dullness to percussion RLL Back: symmetric, no curvature.  ROM normal. No CVA tenderness. Cardio: regular rate and rhythm, S1, S2 normal, no murmur, click, rub or gallop GI: soft, non-tender; bowel sounds normal; no masses,  no organomegaly Extremities: extremities normal, atraumatic, no cyanosis or edema Neurologic: Alert and oriented X 3, normal strength and tone. Normal symmetric reflexes. Normal coordination and gait  ECOG PERFORMANCE STATUS: 1 - Symptomatic but completely ambulatory  Blood pressure (!) 139/95, pulse 96, temperature (!) 97.2 F (36.2 C), temperature source Tympanic, resp. rate 19, height _0  (1.753 m), weight 275 lb 12.8 oz (125.1 kg), SpO2 95 %.  LABORATORY DATA: Lab Results  Component Value Date   WBC 4.8 09/11/2020   HGB 12.7 (L) 09/11/2020   HCT 39.4 09/11/2020   MCV 92.3 09/11/2020   PLT 177 09/11/2020      Chemistry      Component Value Date/Time   NA 138 09/11/2020 1118   K 4.5 09/11/2020 1118   CL 104 09/11/2020 1118   CO2  30 09/11/2020 1118   BUN 15 09/11/2020 1118   CREATININE 1.13 09/11/2020 1118      Component Value Date/Time   CALCIUM 9.1 09/11/2020 1118   ALKPHOS 61 09/11/2020 1118   AST 15 09/11/2020 1118   ALT 14 09/11/2020 1118   BILITOT 0.3 09/11/2020 1118       RADIOGRAPHIC STUDIES: CT CHEST ABDOMEN PELVIS W CONTRAST  Result Date: 09/06/2020 CLINICAL DATA:  Small-cell lung cancer.  Restaging. EXAM: CT CHEST, ABDOMEN, AND PELVIS WITH CONTRAST TECHNIQUE: Multidetector CT imaging of the chest, abdomen and pelvis was performed following the standard protocol during bolus administration of intravenous contrast. CONTRAST:  131m OMNIPAQUE IOHEXOL 300 MG/ML  SOLN COMPARISON:  07/12/2020 FINDINGS: CT CHEST FINDINGS Cardiovascular: The heart size is normal. No substantial pericardial effusion. Coronary artery calcification is evident. Atherosclerotic calcification is noted in the wall of the thoracic aorta. Right Port-A-Cath tip is positioned at the SVC/RA junction. Pulmonary embolus in the left lower lobe seen previously is not definitely evident today. Mediastinum/Nodes: No mediastinal lymphadenopathy. The abnormal soft tissue seen previously in the right paratracheal mediastinum, central right hilum and subcarinal space appears less confluent in the interval. 2.0 x 2.0 cm collection of soft tissue in the precarinal/low right paratracheal region is decreased from 2.2 x 2.9 cm previously (remeasured). No left hilar lymphadenopathy. The esophagus has normal imaging features. There is no axillary lymphadenopathy. Lungs/Pleura: Interval marked progression of relatively diffuse ground-glass opacity with associated areas of consolidative airspace disease in the parahilar right upper, middle, and lower lobes. The inferior right lower lobe/right lung base is spared. Moderate right pleural effusion is progressive in the interval. No suspicious pulmonary nodule or mass in the left lung. No left pleural effusion.  Musculoskeletal: Subtle sclerosis in the left clavicular head is stable. CT ABDOMEN PELVIS FINDINGS Hepatobiliary: The multiple tiny hypoattenuating liver lesions show continued generalized interval improvement with no new or progressive lesion evident on today's study. 6 mm lesion in the dome of liver on 49/2 was 8 mm previously (remeasured). No new mass lesion evident within the liver. There is no evidence for gallstones, gallbladder wall thickening, or pericholecystic fluid. No intrahepatic or extrahepatic biliary dilation. Pancreas: No focal mass lesion. No dilatation of the main duct. No intraparenchymal cyst. No peripancreatic edema. Spleen: No splenomegaly. No focal mass lesion. Adrenals/Urinary Tract: No adrenal nodule or mass. Stable right renal cysts. Left kidney unremarkable. No evidence for hydroureter. The urinary bladder appears normal for the degree of distention. Stomach/Bowel: Stomach is unremarkable.  No gastric wall thickening. No evidence of outlet obstruction. Duodenum is normally positioned as is the ligament of Treitz. Duodenal diverticulum noted. No small bowel wall thickening. No small bowel dilatation. The terminal ileum is normal. The appendix is normal. No gross colonic mass. No colonic wall thickening. Vascular/Lymphatic: There is abdominal aortic atherosclerosis without aneurysm. There is no gastrohepatic or hepatoduodenal ligament lymphadenopathy. No retroperitoneal or mesenteric lymphadenopathy. No pelvic sidewall lymphadenopathy. Reproductive: The prostate gland and seminal vesicles are unremarkable. Other: No intraperitoneal free fluid. Musculoskeletal: No worrisome lytic or sclerotic osseous abnormality. IMPRESSION: 1. Interval marked progression of relatively diffuse ground-glass opacity with associated areas of consolidative airspace disease in the parahilar right upper, middle, and lower lobes. The inferior right lower lobe/right lung base is spared. Imaging features likely  reflect evolving radiation fibrosis although infection and tumor recurrence cannot be excluded. 2. Interval increase in right pleural effusion, now moderate in size. 3. Interval decrease in size of the abnormal soft tissue seen previously in the right paratracheal mediastinum, central right hilum, and subcarinal space. 4. Continued further improvement in multiple tiny hypoattenuating liver lesions. No new liver metastases evident. 5. Pulmonary embolus in the left lower lobe seen previously is not definitely seen today. 6. Aortic Atherosclerosis (ICD10-I70.0). Electronically Signed   By: Misty Stanley M.D.   On: 09/06/2020 13:22    ASSESSMENT AND PLAN: This is a very pleasant 69 years old white male recently diagnosed with extensive stage (T4, N3, M1 C) small cell lung cancer presented with large right upper lobe lung mass in addition to mediastinal and right supraclavicular lymphadenopathy and multiple metastatic liver lesions diagnosed in April 2021. The patient is currently undergoing systemic chemotherapy with carboplatin for AUC of 5 on day 1, etoposide 100 mg/M2 on days 1, 2 and 3 in addition to Cosela for myeloprotection during the days of the chemotherapy.  He will also receive immunotherapy with Imfinzi on day one of the chemotherapy cycle.  Status post 6 cycles.  Starting from cycle #5 he is on maintenance treatment with Imfinzi 1500 mg IV every 4 weeks. The patient continues to tolerate his treatment with Imfinzi fairly well with no concerning complaints except for the shortness of breath and cough. He had repeat CT scan of the chest, abdomen pelvis performed recently.  I personally and independently reviewed the scan images and discussed the result and showed the images to the patient and his wife. His scan showed marked progression of the diffuse groundglass opacity with associated areas of consolidative airspace disease in the perihilar right upper, middle and lower lobes concerning for evolving  radiation pneumonitis but also immunotherapy mediated pneumonitis could not be ruled out at this point.  He has improvement in the abnormal soft tissue seen in the right paratracheal mediastinum, central right hilum and subcarinal space with continued improvement of multiple tiny hypoattenuating liver lesions.  The previously seen pulmonary embolism was not definitely seen on the recent exam. I had a lengthy discussion with the patient about his condition and treatment options.  I gave the patient the option of continuing his current treatment with Imfinzi with close monitoring of the shortness of breath and the cough versus taking a break of the treatment because of the concern of immunotherapy mediated pneumonitis and start the patient on a tapered dose of prednisone over the next 4 weeks.  The patient and his wife are interested in holding his treatment for now because of the concern about the the immunotherapy mediated pneumonitis. He will be  treated with a tapered dose of prednisone over the next 4 weeks.  He will need to monitor his blood sugar closely during this time because of his history of diabetes mellitus. For the pulmonary embolism, he will continue his current treatment with Eliquis. For the enlarging right pleural effusion, I will send the patient to IR for consideration of ultrasound-guided right thoracentesis. The patient will come back for follow-up visit in 4 weeks for evaluation before resuming his treatment. He was also advised to call immediately if he has any concerning symptoms in the interval.  The patient voices understanding of current disease status and treatment options and is in agreement with the current care plan.  All questions were answered. The patient knows to call the clinic with any problems, questions or concerns. We can certainly see the patient much sooner if necessary.  The total time spent in the appointment was 50 minutes.  Disclaimer: This note was dictated  with voice recognition software. Similar sounding words can inadvertently be transcribed and may not be corrected upon review.

## 2020-09-11 NOTE — Patient Instructions (Signed)

## 2020-09-12 ENCOUNTER — Telehealth: Payer: Self-pay | Admitting: Internal Medicine

## 2020-09-12 ENCOUNTER — Other Ambulatory Visit (HOSPITAL_COMMUNITY)
Admission: RE | Admit: 2020-09-12 | Discharge: 2020-09-12 | Disposition: A | Discharge status: Home / Self Care | Payer: Medicare Other | Source: Ambulatory Visit | Attending: Internal Medicine | Admitting: Internal Medicine

## 2020-09-12 DIAGNOSIS — Z01812 Encounter for preprocedural laboratory examination: Secondary | ICD-10-CM | POA: Diagnosis present

## 2020-09-12 DIAGNOSIS — C3411 Malignant neoplasm of upper lobe, right bronchus or lung: Secondary | ICD-10-CM | POA: Diagnosis not present

## 2020-09-12 DIAGNOSIS — J90 Pleural effusion, not elsewhere classified: Secondary | ICD-10-CM | POA: Diagnosis not present

## 2020-09-12 DIAGNOSIS — Z20822 Contact with and (suspected) exposure to covid-19: Secondary | ICD-10-CM | POA: Diagnosis not present

## 2020-09-12 LAB — SARS CORONAVIRUS 2 (TAT 6-24 HRS): SARS Coronavirus 2: NEGATIVE

## 2020-09-12 NOTE — Telephone Encounter (Signed)
appts already scheduled per 9/28 LOS - no additional appts added

## 2020-09-14 ENCOUNTER — Other Ambulatory Visit: Payer: Self-pay

## 2020-09-14 ENCOUNTER — Ambulatory Visit (HOSPITAL_COMMUNITY)
Admission: RE | Admit: 2020-09-14 | Discharge: 2020-09-14 | Disposition: A | Discharge status: Home / Self Care | Payer: Medicare Other | Source: Ambulatory Visit | Attending: Radiology | Admitting: Radiology

## 2020-09-14 ENCOUNTER — Ambulatory Visit (HOSPITAL_COMMUNITY)
Admission: RE | Admit: 2020-09-14 | Discharge: 2020-09-14 | Disposition: A | Discharge status: Home / Self Care | Payer: Medicare Other | Source: Ambulatory Visit | Attending: Internal Medicine | Admitting: Internal Medicine

## 2020-09-14 DIAGNOSIS — Z9889 Other specified postprocedural states: Secondary | ICD-10-CM

## 2020-09-14 DIAGNOSIS — C3411 Malignant neoplasm of upper lobe, right bronchus or lung: Secondary | ICD-10-CM | POA: Insufficient documentation

## 2020-09-14 DIAGNOSIS — Z01812 Encounter for preprocedural laboratory examination: Secondary | ICD-10-CM | POA: Diagnosis not present

## 2020-09-14 DIAGNOSIS — J90 Pleural effusion, not elsewhere classified: Secondary | ICD-10-CM | POA: Insufficient documentation

## 2020-09-14 DIAGNOSIS — Z20822 Contact with and (suspected) exposure to covid-19: Secondary | ICD-10-CM | POA: Insufficient documentation

## 2020-09-14 MED ORDER — LIDOCAINE HCL 1 % IJ SOLN
INTRAMUSCULAR | Status: AC
  Filled 2020-09-14: qty 20

## 2020-09-14 NOTE — Procedures (Signed)
Ultrasound-guided  therapeutic right thoracentesis performed yielding 570 cc of amber fluid. No immediate complications. Follow-up chest x-ray pending. EBL < 1 cc.

## 2020-09-17 ENCOUNTER — Other Ambulatory Visit: Payer: Self-pay | Admitting: Primary Care

## 2020-09-17 DIAGNOSIS — E119 Type 2 diabetes mellitus without complications: Secondary | ICD-10-CM

## 2020-09-26 ENCOUNTER — Telehealth: Payer: Self-pay | Admitting: Medical Oncology

## 2020-09-26 ENCOUNTER — Encounter: Payer: Self-pay | Admitting: Internal Medicine

## 2020-09-26 DIAGNOSIS — E118 Type 2 diabetes mellitus with unspecified complications: Secondary | ICD-10-CM

## 2020-09-26 MED ORDER — NOVOLOG FLEXPEN 100 UNIT/ML ~~LOC~~ SOPN: PEN_INJECTOR | SUBCUTANEOUS | 0 refills | Status: DC

## 2020-09-26 NOTE — Telephone Encounter (Signed)
We will send patient MyChart message.

## 2020-09-26 NOTE — Telephone Encounter (Addendum)
Called and spoke with patient via phone to obtain additional information as there was little to no communication from the cancer center, see original message below. This was very disappointing.   Patient is on very high doses of prednisone, has completed 2 weeks and has another 2 weeks to go.  For this reason we cannot stop his prednisone, and will need to control glucose with fast acting insulin.    Prescription for NovoLog sent to pharmacy.  Will send specific instructions via MyChart, also discussed via phone. Discussed to monitor blood sugars at least 3 times daily and to update me tomorrow with readings.  He verbalized understanding.  We also discussed that he needed to come in to the office for follow-up of diabetes as he has not been in since November 2020.  He verbalized understanding.

## 2020-09-26 NOTE — Telephone Encounter (Signed)
Hyperglycemia- Glucose 558 today at 230 p today.  excessive voiding, Thirsty.   "The Prednisone is doing a job on him. He's started on 2 pills per day, as of today.  Can't sleep, up to [facility] almost hourly, BEYOND irritable/aggitated, and his Blood Sugar is very high @ "586 today ".  His breathing and coughing is much improved since Fluid was drained.   Per Dr Julien Nordmann I instructed pt to contact PCP  or go to ED.

## 2020-09-26 NOTE — Telephone Encounter (Signed)
Patient's wife called in stating they spoke with oncologist and was told to speak with PCP to renew script for Basaglar insulin pen. Please advise.

## 2020-09-26 NOTE — Addendum Note (Signed)
Addended by: [name] on: 09/26/2020 04:52 PM   Modules accepted: Orders

## 2020-09-27 NOTE — Telephone Encounter (Signed)
Patient sent second message all was handled yesterday. No further action needed.

## 2020-10-07 NOTE — Progress Notes (Signed)
[facility] OFFICE PROGRESS NOTE  [name], NP [facility] [id number]  DIAGNOSIS: Extensive stage (T4, N3, M1c) small cell lung cancer diagnosed in April 2021 and presented with large right upper lobe/right hilar mass with ipsilateral and contralateral mediastinal and right supraclavicular lymphadenopathy in addition to multiple liver lesions  PRIOR THERAPY: Palliative radiotherapy to the right upper lobe lung mass under the care of Dr. Lisbeth Renshaw  CURRENT THERAPY: Systemic chemotherapy with carboplatin for AUC of 5 on day 1, etoposide 100 mg/M2 on days 1, 2 and 3 in addition to Imfinzi 1500 mg IV every 3 weeks with chemotherapy then every 4 weeks for maintenance if the patient has no evidence for progression.  He will also receive Cosela 240 mg/m2 on the days of the chemotherapy.  Status post 6 cycles.  Starting from cycle #5 the patient will be on maintenance treatment with immunotherapy with Imfinzi 1500 mg IV every 4 weeks.  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   INTERVAL HISTORY: Adam Wise 69 y.o. male returns to the clinic today for a follow up visit. The patient's treatment has been on hold for several weeks due to suspicious pneumonitis. He is on a high dose prednisone taper. He was having hyperglycemia due to the prednisone. His PCP started him on insulin. He took his last dose of 20 mg of prednisone this AM and is starting 10 mg tomorrow. Since completing his prednisone taper, his breathing has improved. His cough has resolved. He still endorses shortness of breath with exertion but states that it is improved. He noticed the most appreciable difference in his breathing after his thoracentesis. His thoracentesis yielded 570 ml of fluid.    Today, he denies any fevers, chills, night sweats, or weight loss. He denies chest pain or hemoptysis. He denies any nausea, vomiting, or constipation. He  reports diarrhea about 1 time per day. Denies any headaches or visual changes. Denies rashes or skin changes. He is here for evaluation before consideration of resuming his treatment.   MEDICAL HISTORY: Past Medical History:  Diagnosis Date  . Cancer ([place])   . Chickenpox   . Chronic knee pain   . Chronic low back pain   . Essential hypertension   . GERD (gastroesophageal reflux disease)   . OSA (obstructive sleep apnea)   . Testosterone deficiency   . Type 2 diabetes mellitus ([facility])     ALLERGIES:  is allergic to bupropion.  MEDICATIONS:  Current Outpatient Medications  Medication Sig Dispense Refill  . acetaminophen (TYLENOL) 500 MG tablet Take 1,500 mg by mouth every 8 (eight) hours as needed for moderate pain or headache.    . albuterol (VENTOLIN HFA) 108 (90 Base) MCG/ACT inhaler Inhale 2 puffs into the lungs every 6 (six) hours as needed for wheezing or shortness of breath. 18 g 0  . amLODipine (NORVASC) 10 MG tablet TAKE 1 TABLET BY MOUTH EVERY DAY 90 tablet 1  . Black Pepper-Turmeric (TURMERIC COMPLEX/BLACK PEPPER PO) Take 1 tablet by mouth in the morning and at bedtime.    . blood glucose meter kit and supplies KIT Dispense based on patient and insurance preference. Use up to four times daily as directed. 1 each 0  . chlorpheniramine-HYDROcodone (TUSSIONEX) 10-8 MG/5ML SUER Take 5 mLs by mouth at bedtime as needed for cough. 140 mL 0  . Coenzyme Q10 ([employer] COQ-10) 200 MG capsule Take 200 mg by mouth every evening.     Marland Kitchen  ELIQUIS 5 MG TABS tablet TAKE 1 TABLET BY MOUTH TWICE A DAY 180 tablet 2  . esomeprazole (NEXIUM) 20 MG capsule Take 20 mg by mouth daily.     . fluticasone (FLONASE) 50 MCG/ACT nasal spray Place 2 sprays into both nostrils daily as needed for allergies.     Marland Kitchen gabapentin (NEURONTIN) 300 MG capsule Take 300 mg by mouth 2 (two) times daily.     Marland Kitchen glipiZIDE (GLUCOTROL) 10 MG tablet TAKE 1 TABLET (10 MG TOTAL) BY MOUTH 2 (TWO) TIMES DAILY BEFORE A MEAL. FOR DIABETES.  180 tablet 2  . HYDROcodone-homatropine (HYCODAN) 5-1.5 MG/5ML syrup Take 5 mLs by mouth at bedtime as needed for cough.    . insulin aspart (NOVOLOG FLEXPEN) 100 UNIT/ML FlexPen Inject three times daily per sliding scale. 15 mL 0  . KRILL OIL PO Take 350 mg by mouth daily.    . Lancets MISC USE UP TO 3 TIMES DAILY AS DIRECTED 100 each 2  . lidocaine-prilocaine (EMLA) cream Apply 1 application topically as needed. (Patient taking differently: Apply 1 application topically as needed (access port). ) 30 g 0  . lisinopril (ZESTRIL) 5 MG tablet TAKE 1 TABLET BY MOUTH EVERY DAY 90 tablet 1  . Melatonin 10 MG CAPS Take 10 mg by mouth at bedtime.    . metFORMIN (GLUCOPHAGE) 1000 MG tablet TAKE 1 TABLET (1,000 MG TOTAL) BY MOUTH 2 (TWO) TIMES DAILY WITH A MEAL. 180 tablet 0  . Multiple Vitamin (MULTI-VITAMINS) TABS Take 1 tablet by mouth daily.     Letta Pate ULTRA test strip USE UP TO 4 TIMES DAILY AS DIRECTED 100 strip 5  . oxymetazoline (AFRIN) 0.05 % nasal spray Place 1 spray into both nostrils 2 (two) times daily as needed for congestion.    . pioglitazone (ACTOS) 45 MG tablet TAKE 1 TABLET BY MOUTH EVERY DAY 90 tablet 1  . pravastatin (PRAVACHOL) 40 MG tablet TAKE 1 TABLET BY MOUTH EVERY DAY IN THE EVENING FOR CHOLESTEROL 90 tablet 1  . predniSONE (DELTASONE) 20 MG tablet 6 tablet p.o. daily for 1 week followed by 4 tablet p.o. daily for 1 week followed by 2 tablets p.o. daily for 1 week followed by 1 tablet p.o. daily for 1 week followed by half tablet p.o. daily for 1 week. 95 tablet 0  . prochlorperazine (COMPAZINE) 10 MG tablet Take 1 tablet (10 mg total) by mouth every 6 (six) hours as needed for nausea or vomiting. 30 tablet 0  . sildenafil (VIAGRA) 50 MG tablet TAKE 1 TABLET BY MOUTH EVERY DAY AS NEEDED 12 tablet 1  . sucralfate (CARAFATE) 1 g tablet DISSOLVE ONE TAB IN 15ML'S OF WATER AND TAKE BY MOUTH FOUR TIMES A DAY AS DIRECTED 120 tablet 0  . SYRINGE-NEEDLE, DISP, 3 ML (B-D 3CC LUER-LOK  SYR 22GX1") 22G X 1" 3 ML MISC USE AS INSTRUCTED FOR TESTOSTERONE INJECTION EVERY 2 WEEKS 10 each 2  . testosterone cypionate (DEPOTESTOSTERONE CYPIONATE) 200 MG/ML injection Inject 200 mg into the muscle every 14 (fourteen) days.     No current facility-administered medications for this visit.    SURGICAL HISTORY:  Past Surgical History:  Procedure Laterality Date  . COLONOSCOPY WITH PROPOFOL N/A 12/17/2018   Procedure: COLONOSCOPY WITH PROPOFOL;  Surgeon: Pasty Spillers, MD;  Location: [facility] ENDOSCOPY;  Service: Gastroenterology;  Laterality: N/A;  . IR IMAGING GUIDED PORT INSERTION  04/17/2020  . JOINT REPLACEMENT Bilateral   . REPLACEMENT TOTAL KNEE BILATERAL  2015  .  TONSILLECTOMY  1960    REVIEW OF SYSTEMS:   Review of Systems  Constitutional: Negative for appetite change, chills, fatigue, fever and unexpected weight change.  HENT: Negative for mouth sores, nosebleeds, sore throat and trouble swallowing.   Eyes: Negative for eye problems and icterus.  Respiratory: Positive for shortness of breath with exertion. Negative for cough, hemoptysis, and wheezing.   Cardiovascular: Negative for chest pain and leg swelling.  Gastrointestinal: Negative for abdominal pain, constipation, diarrhea, nausea and vomiting.  Genitourinary: Negative for bladder incontinence, difficulty urinating, dysuria, frequency and hematuria.   Musculoskeletal: Negative for back pain, gait problem, neck pain and neck stiffness.  Skin: Negative for itching and rash.  Neurological: Negative for dizziness, extremity weakness, gait problem, headaches, light-headedness and seizures.  Hematological: Negative for adenopathy. Does not bruise/bleed easily.  Psychiatric/Behavioral: Negative for confusion, depression and sleep disturbance. The patient is not nervous/anxious.     PHYSICAL EXAMINATION:  Blood pressure 140/79, pulse 98, temperature 97.7 F (36.5 C), temperature source Tympanic, resp. rate 20, height 5'  9" (1.753 m), weight 270 lb 11.2 oz (122.8 kg), SpO2 96 %.  ECOG PERFORMANCE STATUS: 1 - Symptomatic but completely ambulatory  Physical Exam  Constitutional: Oriented to person, place, and time and well-developed, well-nourished, and in no distress. HENT:  Head: Normocephalic and atraumatic.  Mouth/Throat: Oropharynx is clear and moist. No oropharyngeal exudate.  Eyes: Conjunctivae are normal. Right eye exhibits no discharge. Left eye exhibits no discharge. No scleral icterus.  Neck: Normal range of motion. Neck supple.  Cardiovascular: Normal rate, regular rhythm, normal heart sounds and intact distal pulses.   Pulmonary/Chest: Effort normal and breath sounds normal. No respiratory distress. No wheezes. No rales.  Abdominal: Soft. Bowel sounds are normal. Exhibits no distension and no mass. There is no tenderness.  Musculoskeletal: Normal range of motion. Exhibits no edema.  Lymphadenopathy:    No cervical adenopathy.  Neurological: Alert and oriented to person, place, and time. Exhibits normal muscle tone. Gait normal. Coordination normal.  Skin: Skin is warm and dry. No rash noted. Not diaphoretic. No erythema. No pallor.  Psychiatric: Mood, memory and judgment normal.  Vitals reviewed.  LABORATORY DATA: Lab Results  Component Value Date   WBC 5.8 10/09/2020   HGB 12.9 (L) 10/09/2020   HCT 39.1 10/09/2020   MCV 90.3 10/09/2020   PLT 124 (L) 10/09/2020      Chemistry      Component Value Date/Time   NA 135 10/09/2020 0950   K 4.4 10/09/2020 0950   CL 102 10/09/2020 0950   CO2 24 10/09/2020 0950   BUN 18 10/09/2020 0950   CREATININE 1.18 10/09/2020 0950      Component Value Date/Time   CALCIUM 8.9 10/09/2020 0950   ALKPHOS 55 10/09/2020 0950   AST 11 (L) 10/09/2020 0950   ALT 21 10/09/2020 0950   BILITOT 0.3 10/09/2020 0950       RADIOGRAPHIC STUDIES:  DG Chest 1 View  Result Date: 09/14/2020 CLINICAL DATA:  Status post thoracentesis, lung cancer EXAM: CHEST   1 VIEW COMPARISON:  03/14/2020 FINDINGS: Posttreatment changes in the right perihilar region. Trace right pleural effusion. Right lung volume loss. No pneumothorax. Left lung is clear. Stable cardiomediastinal silhouette. Right-sided Port-A-Cath in satisfactory position. No acute osseous abnormality. IMPRESSION: Posttreatment changes in the right perihilar region. Trace right pleural effusion. No pneumothorax. Electronically Signed   By: Kathreen Devoid   On: 09/14/2020 12:26   US Thoracentesis Asp Pleural space w/IMG guide  Result  Date: 09/14/2020 INDICATION: Patient with history of extensive stage small cell lung cancer, right pleural effusion. Request received for therapeutic right thoracentesis. EXAM: ULTRASOUND GUIDED THERAPEUTIC RIGHT THORACENTESIS MEDICATIONS: 1% lidocaine to skin and subcutaneous tissue COMPLICATIONS: None immediate. PROCEDURE: An ultrasound guided thoracentesis was thoroughly discussed with the patient and questions answered. The benefits, risks, alternatives and complications were also discussed. The patient understands and wishes to proceed with the procedure. Written consent was obtained. Ultrasound was performed to localize and mark an adequate pocket of fluid in the right chest. The area was then prepped and draped in the normal sterile fashion. 1% Lidocaine was used for local anesthesia. Under ultrasound guidance a 6 Fr Safe-T-Centesis catheter was introduced. Thoracentesis was performed. The catheter was removed and a dressing applied. FINDINGS: A total of approximately 570 cc of amber fluid was removed. IMPRESSION: Successful ultrasound guided therapeutic right thoracentesis yielding 570 cc of pleural fluid. Read by: Rowe Robert, PA-C Electronically Signed   By: Jacqulynn Cadet M.D.   On: 09/14/2020 12:14     ASSESSMENT/PLAN:  This is a very pleasant 60 year oldCaucasianmale recently diagnosed with extensive stage (T4, N3, M1 C) small cell lung cancer presented with large  right upper lobe lung mass in addition to mediastinal and right supraclavicular lymphadenopathy and multiple metastatic liver lesions diagnosed in April 2021.  The patient is currently undergoing systemic chemotherapy with carboplatin for an AUC 5 on day 1, etoposide 100 mg/m2 on days 1, 2, and 3 in addition to Cosela for myeloprotection during the days of chemotherapy. He also will receive immunotherapy with Imfinzi on day 1 of chemotherapy cycle. He is status post6cycles.Starting from cycle #5, he has been on maintenance single agent immunotherapy with Imfinzi. His treatment was on hold from cycle #6 due to suspicious immunotherapy pneumonitis.   He took his last dose of 20 mg of prednisone today. He is scheduled to start his 10 mg dose tomorrow.   The patient was seen with Dr. Julien Nordmann. Labs were reviewed. Dr. Julien Nordmann recommends repeating his CT scan of his chest before considering resuming his treatment with immunotherapy. I will arrange for a repeat CT scan of the chest to be performed this week. In the meantime, he will complete his prednisone taper.   We will see him back for a follow up visit in 1 week for evaluation and to review his scan before starting cycle #7  He will continue to take his insulin as prescribed by his PCP for his hyperglycemia.   The patient was advised to call immediately if he has any concerning symptoms in the interval. The patient voices understanding of current disease status and treatment options and is in agreement with the current care plan. All questions were answered. The patient knows to call the clinic with any problems, questions or concerns. We can certainly see the patient much sooner if necessary        Orders Placed This Encounter  Procedures  . CT Chest W Contrast    Standing Status:   Future    Standing Expiration Date:   10/09/2021    Order Specific Question:   If indicated for the ordered procedure, I authorize the administration of  contrast media per Radiology protocol    Answer:   Yes    Order Specific Question:   Preferred imaging location?    Answer:   [place], PA-C 10/09/20  ADDENDUM: Hematology/Oncology Attending: Had a face-to-face encounter with the  patient today.  I recommended his care plan.  This is a very pleasant 69 years old white male with extensive stage small cell lung cancer status post 4 cycles of systemic chemotherapy with carboplatin, etoposide, Imfinzi and Cosela with partial response.  The patient started maintenance treatment with Imfinzi every 4 weeks status post 2 more cycles. His last imaging studies showed concerning findings for immunotherapy mediated pneumonitis from the treatment with Imfinzi.  His treatment was placed on hold and the patient is started on high-dose prednisone tapered over the last few weeks.  Starting tomorrow he will be on prednisone 10 mg p.o. daily.  The patient also underwent ultrasound-guided right thoracentesis and he has a lot of improvement in his breathing after the procedure but now he started feeling short of breath again. He was supposed to start cycle #7 of his treatment today.  I recommended for the patient to have repeat CT scan of the chest for evaluation of the otitis and pleural effusion before resuming his treatment. He will continue to taper his prednisone with 10 mg p.o. daily for next week. The patient was advised to call immediately if he has any concerning symptoms in the interval.  Disclaimer: This note was dictated with voice recognition software. Similar sounding words can inadvertently be transcribed and may be missed upon review. Eilleen Kempf, MD 10/09/20

## 2020-10-08 DIAGNOSIS — E119 Type 2 diabetes mellitus without complications: Secondary | ICD-10-CM

## 2020-10-09 ENCOUNTER — Encounter: Payer: Self-pay | Admitting: Physician Assistant

## 2020-10-09 ENCOUNTER — Telehealth: Payer: Self-pay | Admitting: Physician Assistant

## 2020-10-09 ENCOUNTER — Telehealth: Payer: Self-pay | Admitting: Primary Care

## 2020-10-09 ENCOUNTER — Inpatient Hospital Stay (HOSPITAL_BASED_OUTPATIENT_CLINIC_OR_DEPARTMENT_OTHER): Payer: Medicare Other | Admitting: Physician Assistant

## 2020-10-09 ENCOUNTER — Inpatient Hospital Stay: Payer: Medicare Other | Attending: Internal Medicine

## 2020-10-09 ENCOUNTER — Inpatient Hospital Stay: Payer: Medicare Other

## 2020-10-09 ENCOUNTER — Other Ambulatory Visit: Payer: Self-pay | Admitting: Internal Medicine

## 2020-10-09 ENCOUNTER — Other Ambulatory Visit: Payer: Self-pay

## 2020-10-09 VITALS — BP 140/79 | HR 98 | Temp 97.7°F | Resp 20 | Ht 69.0 in | Wt 270.7 lb

## 2020-10-09 DIAGNOSIS — Z7901 Long term (current) use of anticoagulants: Secondary | ICD-10-CM | POA: Diagnosis not present

## 2020-10-09 DIAGNOSIS — R5382 Chronic fatigue, unspecified: Secondary | ICD-10-CM

## 2020-10-09 DIAGNOSIS — Z452 Encounter for adjustment and management of vascular access device: Secondary | ICD-10-CM | POA: Diagnosis present

## 2020-10-09 DIAGNOSIS — I10 Essential (primary) hypertension: Secondary | ICD-10-CM | POA: Insufficient documentation

## 2020-10-09 DIAGNOSIS — Z96653 Presence of artificial knee joint, bilateral: Secondary | ICD-10-CM | POA: Diagnosis not present

## 2020-10-09 DIAGNOSIS — Z794 Long term (current) use of insulin: Secondary | ICD-10-CM | POA: Diagnosis not present

## 2020-10-09 DIAGNOSIS — C3411 Malignant neoplasm of upper lobe, right bronchus or lung: Secondary | ICD-10-CM

## 2020-10-09 DIAGNOSIS — G4733 Obstructive sleep apnea (adult) (pediatric): Secondary | ICD-10-CM | POA: Insufficient documentation

## 2020-10-09 DIAGNOSIS — K219 Gastro-esophageal reflux disease without esophagitis: Secondary | ICD-10-CM | POA: Insufficient documentation

## 2020-10-09 DIAGNOSIS — Z79899 Other long term (current) drug therapy: Secondary | ICD-10-CM | POA: Diagnosis not present

## 2020-10-09 DIAGNOSIS — E1165 Type 2 diabetes mellitus with hyperglycemia: Secondary | ICD-10-CM | POA: Diagnosis not present

## 2020-10-09 DIAGNOSIS — Z7952 Long term (current) use of systemic steroids: Secondary | ICD-10-CM | POA: Insufficient documentation

## 2020-10-09 LAB — CMP (CANCER CENTER ONLY)
ALT: 21 U/L (ref 0–44)
AST: 11 U/L — ABNORMAL LOW (ref 15–41)
Albumin: 2.9 g/dL — ABNORMAL LOW (ref 3.5–5.0)
Alkaline Phosphatase: 55 U/L (ref 38–126)
Anion gap: 9 (ref 5–15)
BUN: 18 mg/dL (ref 8–23)
CO2: 24 mmol/L (ref 22–32)
Calcium: 8.9 mg/dL (ref 8.9–10.3)
Chloride: 102 mmol/L (ref 98–111)
Creatinine: 1.18 mg/dL (ref 0.61–1.24)
GFR, Estimated: >60 mL/min (ref >60)
Glucose, Bld: 271 mg/dL — ABNORMAL HIGH (ref 70–99)
Potassium: 4.4 mmol/L (ref 3.5–5.1)
Sodium: 135 mmol/L (ref 135–145)
Total Bilirubin: 0.3 mg/dL (ref 0.3–1.2)
Total Protein: 5.8 g/dL — ABNORMAL LOW (ref 6.5–8.1)

## 2020-10-09 LAB — CBC WITH DIFFERENTIAL (CANCER CENTER ONLY)
Abs Immature Granulocytes: 0.06 10*3/uL (ref 0.00–0.07)
Basophils Absolute: 0 10*3/uL (ref 0.0–0.1)
Basophils Relative: 1 %
Eosinophils Absolute: 0.1 10*3/uL (ref 0.0–0.5)
Eosinophils Relative: 2 %
HCT: 39.1 % (ref 39.0–52.0)
Hemoglobin: 12.9 g/dL — ABNORMAL LOW (ref 13.0–17.0)
Immature Granulocytes: 1 %
Lymphocytes Relative: 6 %
Lymphs Abs: 0.3 10*3/uL — ABNORMAL LOW (ref 0.7–4.0)
MCH: 29.8 pg (ref 26.0–34.0)
MCHC: 33 g/dL (ref 30.0–36.0)
MCV: 90.3 fL (ref 80.0–100.0)
Monocytes Absolute: 0.5 10*3/uL (ref 0.1–1.0)
Monocytes Relative: 8 %
Neutro Abs: 4.8 10*3/uL (ref 1.7–7.7)
Neutrophils Relative %: 82 %
Platelet Count: 124 10*3/uL — ABNORMAL LOW (ref 150–400)
RBC: 4.33 MIL/uL (ref 4.22–5.81)
RDW: 13.7 % (ref 11.5–15.5)
WBC Count: 5.8 10*3/uL (ref 4.0–10.5)
nRBC: 0 % (ref 0.0–0.2)

## 2020-10-09 LAB — TSH: TSH: 0.594 u[IU]/mL (ref 0.320–4.118)

## 2020-10-09 MED ORDER — SODIUM CHLORIDE 0.9% FLUSH
10.0000 mL | Freq: Once | INTRAVENOUS | Status: AC
  Administered 2020-10-09: 10 mL via INTRAVENOUS
  Filled 2020-10-09: qty 10

## 2020-10-09 MED ORDER — HEPARIN SOD (PORK) LOCK FLUSH 100 UNIT/ML IV SOLN
500.0000 [IU] | Freq: Once | INTRAVENOUS | Status: AC
  Administered 2020-10-09: 500 [IU] via INTRAVENOUS
  Filled 2020-10-09: qty 5

## 2020-10-09 MED ORDER — SODIUM CHLORIDE 0.9% FLUSH
10.0000 mL | INTRAVENOUS | Status: DC | PRN
  Administered 2020-10-09: 10 mL
  Filled 2020-10-09: qty 10

## 2020-10-09 MED ORDER — HEPARIN SOD (PORK) LOCK FLUSH 100 UNIT/ML IV SOLN
500.0000 [IU] | Freq: Once | INTRAVENOUS | Status: DC | PRN
  Filled 2020-10-09: qty 5

## 2020-10-09 MED ORDER — BASAGLAR KWIKPEN 100 UNIT/ML ~~LOC~~ SOPN: 15.0000 [IU] | PEN_INJECTOR | Freq: Every day | SUBCUTANEOUS | 3 refills | Status: DC

## 2020-10-09 NOTE — Telephone Encounter (Signed)
LVM for pt to RTN my call to R/S appt with [facility] on 11/05/20. Informed pt in the message that I do have an appt on 11/13/20 at 3:30pm on hold for him.

## 2020-10-09 NOTE — Telephone Encounter (Signed)
Rescheduled appointment per 10/26 los. Spoke to patient who is aware of appointments dates

## 2020-10-10 ENCOUNTER — Encounter (HOSPITAL_COMMUNITY): Payer: Self-pay

## 2020-10-10 ENCOUNTER — Ambulatory Visit (HOSPITAL_COMMUNITY)
Admission: RE | Admit: 2020-10-10 | Discharge: 2020-10-10 | Disposition: A | Discharge status: Home / Self Care | Payer: Medicare Other | Source: Ambulatory Visit | Attending: Physician Assistant | Admitting: Physician Assistant

## 2020-10-10 DIAGNOSIS — C3411 Malignant neoplasm of upper lobe, right bronchus or lung: Secondary | ICD-10-CM | POA: Insufficient documentation

## 2020-10-10 MED ORDER — HEPARIN SOD (PORK) LOCK FLUSH 100 UNIT/ML IV SOLN
INTRAVENOUS | Status: AC
  Filled 2020-10-10: qty 5

## 2020-10-10 MED ORDER — HEPARIN SOD (PORK) LOCK FLUSH 100 UNIT/ML IV SOLN
500.0000 [IU] | Freq: Once | INTRAVENOUS | Status: AC
  Administered 2020-10-10: 500 [IU] via INTRAVENOUS

## 2020-10-10 MED ORDER — IOHEXOL 300 MG/ML  SOLN
75.0000 mL | Freq: Once | INTRAMUSCULAR | Status: AC | PRN
  Administered 2020-10-10: 75 mL via INTRAVENOUS

## 2020-10-12 NOTE — Progress Notes (Signed)
[facility] OFFICE PROGRESS NOTE  [name], NP [place] [address]  DIAGNOSIS: Extensive stage (T4, N3, M1c)small cell lung cancer diagnosed in April 2021 and presented with large right upper lobe/right hilar mass with ipsilateral and contralateral mediastinal and right supraclavicular lymphadenopathy in addition to multiple liver lesions  PRIOR THERAPY: Palliative radiotherapy to the right upper lobe lung mass under the care of Dr. Lisbeth Renshaw  CURRENT THERAPY: Systemic chemotherapy with carboplatin for AUC of 5 on day 1, etoposide 100 mg/M2 on days 1, 2 and 3 in addition to Imfinzi 1500 mg IV every 3 weeks with chemotherapy then every 4 weeks for maintenance if the patient has no evidence for progression. He will also receive Cosela 240 mg/m2 on the days of the chemotherapy. Status post 6cycles. 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 He resumed treatment on 10/16/20  INTERVAL HISTORY: Adam Wise 69 y.o. male returns to the clinic today for a follow up visit accompanied by his wife. The patient's treatment has been on hold for several weeks due to suspicious pneumonitis. He is on a high dose prednisone taper. His last dose of prednisone was yesterday. Since completing his prednisone taper, his breathing has improved somewhat but he still endorses significant shortness of breath with exertion and it is starting to worsen. He is scheduled to see his pulmonologist at [facility]. He noticed the most appreciable difference in his breathing after his thoracentesis. His thoracentesis yielded 570 ml of fluid.  He has a restaging CT scan performed to assess for resolution of the suspicious pneumonitis before considering resuming his treatment.   Today, he denies any fevers, chills,  night sweats, or weight loss. He denies chest pain or hemoptysis. He denies any nausea, vomiting, or constipation. He reports occasional diarrhea. Denies any headaches or visual changes. Denies rashes or skin changes. The patient recently had a restaging CT scan performed. He is here for evaluation and to review his scan results before consideration of resuming his treatment with cycle #8 today.     MEDICAL HISTORY: Past Medical History:  Diagnosis Date  . Cancer ([facility])   . Chickenpox   . Chronic knee pain   . Chronic low back pain   . Essential hypertension   . GERD (gastroesophageal reflux disease)   . OSA (obstructive sleep apnea)   . Testosterone deficiency   . Type 2 diabetes mellitus ([facility])     ALLERGIES:  is allergic to bupropion.  MEDICATIONS:  Current Outpatient Medications  Medication Sig Dispense Refill  . acetaminophen (TYLENOL) 500 MG tablet Take 1,500 mg by mouth every 8 (eight) hours as needed for moderate pain or headache.    . albuterol (VENTOLIN HFA) 108 (90 Base) MCG/ACT inhaler Inhale 2 puffs into the lungs every 6 (six) hours as needed for wheezing or shortness of breath. 18 g 0  . amLODipine (NORVASC) 10 MG tablet TAKE 1 TABLET BY MOUTH EVERY DAY 90 tablet 1  . Black Pepper-Turmeric (TURMERIC COMPLEX/BLACK PEPPER PO) Take 1 tablet by mouth in the morning and at bedtime.    . blood glucose meter kit and supplies KIT Dispense based on patient and insurance preference. Use up to four times daily as directed. 1 each 0  . chlorpheniramine-HYDROcodone (TUSSIONEX) 10-8 MG/5ML SUER Take 5 mLs by mouth at bedtime as needed  for cough. 140 mL 0  . Coenzyme Q10 ([employer] COQ-10) 200 MG capsule Take 200 mg by mouth every evening.     Marland Kitchen ELIQUIS 5 MG TABS tablet TAKE 1 TABLET BY MOUTH TWICE A DAY 180 tablet 2  . esomeprazole (NEXIUM) 20 MG capsule Take 20 mg by mouth daily.     . fluticasone (FLONASE) 50 MCG/ACT nasal spray Place 2 sprays into both nostrils daily as needed for  allergies.     Marland Kitchen gabapentin (NEURONTIN) 300 MG capsule Take 300 mg by mouth 2 (two) times daily.     Marland Kitchen glipiZIDE (GLUCOTROL) 10 MG tablet TAKE 1 TABLET (10 MG TOTAL) BY MOUTH 2 (TWO) TIMES DAILY BEFORE A MEAL. FOR DIABETES. 180 tablet 2  . HYDROcodone-homatropine (HYCODAN) 5-1.5 MG/5ML syrup Take 5 mLs by mouth at bedtime as needed for cough.    . insulin aspart (NOVOLOG FLEXPEN) 100 UNIT/ML FlexPen Inject three times daily per sliding scale. 15 mL 0  . Insulin Glargine (BASAGLAR KWIKPEN) 100 UNIT/ML Inject 15 Units into the skin at bedtime. For diabetes. 15 mL 3  . KRILL OIL PO Take 350 mg by mouth daily.    . Lancets MISC USE UP TO 3 TIMES DAILY AS DIRECTED 100 each 2  . lidocaine-prilocaine (EMLA) cream Apply 1 application topically as needed. (Patient taking differently: Apply 1 application topically as needed (access port). ) 30 g 0  . lisinopril (ZESTRIL) 5 MG tablet TAKE 1 TABLET BY MOUTH EVERY DAY 90 tablet 1  . Melatonin 10 MG CAPS Take 10 mg by mouth at bedtime.    . metFORMIN (GLUCOPHAGE) 1000 MG tablet TAKE 1 TABLET (1,000 MG TOTAL) BY MOUTH 2 (TWO) TIMES DAILY WITH A MEAL. 180 tablet 0  . Multiple Vitamin (MULTI-VITAMINS) TABS Take 1 tablet by mouth daily.     Glory Rosebush ULTRA test strip USE UP TO 4 TIMES DAILY AS DIRECTED 100 strip 5  . oxymetazoline (AFRIN) 0.05 % nasal spray Place 1 spray into both nostrils 2 (two) times daily as needed for congestion.    . pioglitazone (ACTOS) 45 MG tablet TAKE 1 TABLET BY MOUTH EVERY DAY 90 tablet 1  . pravastatin (PRAVACHOL) 40 MG tablet TAKE 1 TABLET BY MOUTH EVERY DAY IN THE EVENING FOR CHOLESTEROL 90 tablet 1  . predniSONE (DELTASONE) 20 MG tablet 6 tablet p.o. daily for 1 week followed by 4 tablet p.o. daily for 1 week followed by 2 tablets p.o. daily for 1 week followed by 1 tablet p.o. daily for 1 week followed by half tablet p.o. daily for 1 week. 95 tablet 0  . prochlorperazine (COMPAZINE) 10 MG tablet Take 1 tablet (10 mg total) by mouth  every 6 (six) hours as needed for nausea or vomiting. 30 tablet 0  . sildenafil (VIAGRA) 50 MG tablet TAKE 1 TABLET BY MOUTH EVERY DAY AS NEEDED 12 tablet 1  . sucralfate (CARAFATE) 1 g tablet DISSOLVE ONE TAB IN 15ML'S OF WATER AND TAKE BY MOUTH FOUR TIMES A DAY AS DIRECTED 120 tablet 0  . SYRINGE-NEEDLE, DISP, 3 ML (B-D 3CC LUER-LOK SYR 22GX1") 22G X 1" 3 ML MISC USE AS INSTRUCTED FOR TESTOSTERONE INJECTION EVERY 2 WEEKS 10 each 2  . testosterone cypionate (DEPOTESTOSTERONE CYPIONATE) 200 MG/ML injection Inject 200 mg into the muscle every 14 (fourteen) days.    Marland Kitchen lidocaine-prilocaine (EMLA) cream Apply 1 application topically as needed. 30 g 2   No current facility-administered medications for this visit.    SURGICAL HISTORY:  Past Surgical History:  Procedure Laterality Date  . COLONOSCOPY WITH PROPOFOL N/A 12/17/2018   Procedure: COLONOSCOPY WITH PROPOFOL;  Surgeon: Virgel Manifold, MD;  Location: [facility] ENDOSCOPY;  Service: Gastroenterology;  Laterality: N/A;  . IR IMAGING GUIDED PORT INSERTION  04/17/2020  . JOINT REPLACEMENT Bilateral   . REPLACEMENT TOTAL KNEE BILATERAL  2015  . TONSILLECTOMY  1960    REVIEW OF SYSTEMS:   Review of Systems  Constitutional: Negative for appetite change, chills, fatigue, fever and unexpected weight change.  HENT: Negative for mouth sores, nosebleeds, sore throat and trouble swallowing.   Eyes: Negative for eye problems and icterus.  Respiratory: Positive for shortness of breath with exertion. Negative for cough, hemoptysis, and wheezing.   Cardiovascular: Negative for chest pain and leg swelling.  Gastrointestinal: Negative for abdominal pain, constipation, diarrhea, nausea and vomiting.  Genitourinary: Negative for bladder incontinence, difficulty urinating, dysuria, frequency and hematuria.   Musculoskeletal: Negative for back pain, gait problem, neck pain and neck stiffness.  Skin: Negative for itching and rash.  Neurological: Negative for  dizziness, extremity weakness, gait problem, headaches, light-headedness and seizures.  Hematological: Negative for adenopathy. Does not bruise/bleed easily.  Psychiatric/Behavioral: Negative for confusion, depression and sleep disturbance. The patient is not nervous/anxious.     PHYSICAL EXAMINATION:  Blood pressure 123/77, pulse (!) 121, temperature 98.7 F (37.1 C), temperature source Tympanic, resp. rate 18, height $RemoveBe'5\' 9"'eKNTvHhJB$  (1.753 m), weight 269 lb 3.2 oz (122.1 kg), SpO2 94 %.  ECOG PERFORMANCE STATUS: 1 - Symptomatic but completely ambulatory  Physical Exam  Constitutional: Oriented to person, place, and time and well-developed, well-nourished, and in no distress.  HENT:  Head: Normocephalic and atraumatic.  Mouth/Throat: Oropharynx is clear and moist. No oropharyngeal exudate.  Eyes: Conjunctivae are normal. Right eye exhibits no discharge. Left eye exhibits no discharge. No scleral icterus.  Neck: Normal range of motion. Neck supple.  Cardiovascular: Tachycardic, regular rhythm, normal heart sounds and intact distal pulses.   Pulmonary/Chest: Effort normal and breath sounds normal. No respiratory distress. No wheezes. No rales.  Abdominal: Soft. Bowel sounds are normal. Exhibits no distension and no mass. There is no tenderness.  Musculoskeletal: Normal range of motion. Exhibits no edema.  Lymphadenopathy:    No cervical adenopathy.  Neurological: Alert and oriented to person, place, and time. Exhibits normal muscle tone. Gait normal. Coordination normal.  Skin: Skin is warm and dry. No rash noted. Not diaphoretic. No erythema. No pallor.  Psychiatric: Mood, memory and judgment normal.  Vitals reviewed.  LABORATORY DATA: Lab Results  Component Value Date   WBC 5.2 10/16/2020   HGB 12.8 (L) 10/16/2020   HCT 38.9 (L) 10/16/2020   MCV 89.6 10/16/2020   PLT 178 10/16/2020      Chemistry      Component Value Date/Time   NA 136 10/16/2020 0855   K 4.2 10/16/2020 0855   CL  101 10/16/2020 0855   CO2 25 10/16/2020 0855   BUN 17 10/16/2020 0855   CREATININE 1.30 (H) 10/16/2020 0855      Component Value Date/Time   CALCIUM 9.1 10/16/2020 0855   ALKPHOS 52 10/16/2020 0855   AST 13 (L) 10/16/2020 0855   ALT 16 10/16/2020 0855   BILITOT 0.4 10/16/2020 0855       RADIOGRAPHIC STUDIES:  CT Chest W Contrast  Result Date: 10/10/2020 CLINICAL DATA:  Small-cell lung cancer. Evaluate for improvement in pneumonitis. Restaging. EXAM: CT CHEST WITH CONTRAST TECHNIQUE: Multidetector CT imaging of the chest was  performed during intravenous contrast administration. CONTRAST:  8mL OMNIPAQUE IOHEXOL 300 MG/ML  SOLN COMPARISON:  None. FINDINGS: Cardiovascular: The heart size is normal. No substantial pericardial effusion. Coronary artery calcification is evident. Atherosclerotic calcification is noted in the wall of the thoracic aorta. Right Port-A-Cath tip is positioned at the SVC/RA junction. Mediastinum/Nodes: Small bilobed fluid density collection in the anterior mediastinum is stable. No mediastinal lymphadenopathy. There is no hilar lymphadenopathy. The esophagus has normal imaging features. There is no axillary lymphadenopathy. Lungs/Pleura: Right middle lobe volume loss and bronchiectasis is similar to prior. Consolidative airspace disease in the right upper lobe has improved anteriorly without complete resolution. These changes are best appreciated on sagittal imaging. Similarly, the confluent airspace disease in the posterior right lower (superior segment) has decreased. Interval decrease in right pleural effusion, now small in character. No new suspicious nodule or mass in the left lung. Upper Abdomen: Stable tiny right adrenal nodule exophytic cyst upper pole right kidney has been incompletely visualized. Musculoskeletal: No worrisome lytic or sclerotic osseous abnormality. IMPRESSION: 1. Interval improvement in the consolidative airspace disease in the right upper and lower  lobes. 2. Interval decrease in right pleural effusion. 3. Stable volume loss and bronchiectasis in the right middle lobe. 4. Aortic Atherosclerosis (ICD10-I70.0). Electronically Signed   By: Misty Stanley M.D.   On: 10/10/2020 10:29     ASSESSMENT/PLAN:  This is a very pleasant29year oldCaucasianmale recently diagnosed with extensive stage (T4, N3, M1 C) small cell lung cancer presented with large right upper lobe lung mass in addition to mediastinal and right supraclavicular lymphadenopathy and multiple metastatic liver lesions diagnosed in April 2021.  The patient is currently undergoing systemic chemotherapy with carboplatin for an AUC 5 on day 1, etoposide 100 mg/m2 on days 1, 2, and 3 in addition to Cosela for myeloprotection during the days of chemotherapy. He also will receive immunotherapy with Imfinzi on day 1 of chemotherapy cycle. He is status post7cycles.Starting from cycle #5, he has been on maintenance single agent immunotherapy with Imfinzi. His treatment was on hold from cycle #7 due to suspicious immunotherapy pneumonitis.   The patient recently had a restaging CT scan performed. Dr. Julien Nordmann personally and independently reviewed the scan and discussed the results with the patient. The scan showed interval improvement in the consolidative airspace disease in the right upper and lower lobes. The patient still has scarring from his prior radiation treatment, which is likely contributing to his shortness of breath with exertion.   Dr. Julien Nordmann recommends that he proceed with cycle #8 today as scheduled.   We will see him back for a follow up visit in 4 weeks for evaluation before starting cycle #9.   He will follow up with his pulmonologist tomorrow as scheduled for recommendations regarding management of his shortness of breath.   I have sent a refill of EMLA cream to his pharmacy.   The patient was advised to call immediately if he has any concerning symptoms in the  interval. The patient voices understanding of current disease status and treatment options and is in agreement with the current care plan. All questions were answered. The patient knows to call the clinic with any problems, questions or concerns. We can certainly see the patient much sooner if necessary   No orders of the defined types were placed in this encounter.    Adam Borjon L Chukwuemeka Artola, PA-C 10/16/20  ADDENDUM: Hematology/Oncology Attending: I had a face-to-face encounter with the patient today.  I recommended his care plan.  This is a very pleasant 69 years old white male with extensive stage small cell lung cancer status post 4 cycles of systemic chemotherapy with carboplatin, etoposide, Imfinzi and Cosela with partial response.  The patient is currently on maintenance treatment with Imfinzi status post 3 more cycles.  He has been tolerating his treatment well but recently has significant shortness of breath and suspicious immunotherapy mediated pneumonitis.  The patient was treated with a tapered dose of prednisone over several weeks. He had repeat CT scan of the chest performed recently.  I personally and independently reviewed the scan images and discussed the result and showed the images to the patient and his wife. There is improvement of the interstitial lung disease but he continues to have scarring from the previous radiation. I recommended for the patient to resume his treatment with immunotherapy with Imfinzi and he will proceed with cycle #8 today. He will come back for follow-up visit in 4 weeks for evaluation before the next cycle of his treatment. For the residual shortness of breath, he has an appointment with his pulmonologist at [facility] in the next few days. The patient was advised to call immediately if he has any concerning symptoms in the interval.  Disclaimer: This note was dictated with voice recognition software. Similar sounding words can inadvertently  be transcribed and may be missed upon review. Eilleen Kempf, MD 10/16/20

## 2020-10-16 ENCOUNTER — Other Ambulatory Visit: Payer: Self-pay

## 2020-10-16 ENCOUNTER — Inpatient Hospital Stay: Payer: Medicare Other | Attending: Internal Medicine

## 2020-10-16 ENCOUNTER — Encounter: Payer: Self-pay | Admitting: Physician Assistant

## 2020-10-16 ENCOUNTER — Inpatient Hospital Stay: Payer: Medicare Other

## 2020-10-16 ENCOUNTER — Inpatient Hospital Stay (HOSPITAL_BASED_OUTPATIENT_CLINIC_OR_DEPARTMENT_OTHER): Payer: Medicare Other | Admitting: Physician Assistant

## 2020-10-16 VITALS — HR 115

## 2020-10-16 VITALS — BP 123/77 | HR 121 | Temp 98.7°F | Resp 18 | Ht 69.0 in | Wt 269.2 lb

## 2020-10-16 DIAGNOSIS — Z79899 Other long term (current) drug therapy: Secondary | ICD-10-CM | POA: Insufficient documentation

## 2020-10-16 DIAGNOSIS — Z5112 Encounter for antineoplastic immunotherapy: Secondary | ICD-10-CM | POA: Diagnosis present

## 2020-10-16 DIAGNOSIS — Z95828 Presence of other vascular implants and grafts: Secondary | ICD-10-CM | POA: Diagnosis not present

## 2020-10-16 DIAGNOSIS — J470 Bronchiectasis with acute lower respiratory infection: Secondary | ICD-10-CM | POA: Diagnosis not present

## 2020-10-16 DIAGNOSIS — C3411 Malignant neoplasm of upper lobe, right bronchus or lung: Secondary | ICD-10-CM | POA: Insufficient documentation

## 2020-10-16 DIAGNOSIS — J189 Pneumonia, unspecified organism: Secondary | ICD-10-CM | POA: Insufficient documentation

## 2020-10-16 LAB — CBC WITH DIFFERENTIAL (CANCER CENTER ONLY)
Abs Immature Granulocytes: 0.05 10*3/uL (ref 0.00–0.07)
Basophils Absolute: 0 10*3/uL (ref 0.0–0.1)
Basophils Relative: 1 %
Eosinophils Absolute: 0.1 10*3/uL (ref 0.0–0.5)
Eosinophils Relative: 2 %
HCT: 38.9 % — ABNORMAL LOW (ref 39.0–52.0)
Hemoglobin: 12.8 g/dL — ABNORMAL LOW (ref 13.0–17.0)
Immature Granulocytes: 1 %
Lymphocytes Relative: 7 %
Lymphs Abs: 0.4 10*3/uL — ABNORMAL LOW (ref 0.7–4.0)
MCH: 29.5 pg (ref 26.0–34.0)
MCHC: 32.9 g/dL (ref 30.0–36.0)
MCV: 89.6 fL (ref 80.0–100.0)
Monocytes Absolute: 0.5 10*3/uL (ref 0.1–1.0)
Monocytes Relative: 10 %
Neutro Abs: 4.2 10*3/uL (ref 1.7–7.7)
Neutrophils Relative %: 79 %
Platelet Count: 178 10*3/uL (ref 150–400)
RBC: 4.34 MIL/uL (ref 4.22–5.81)
RDW: 13.6 % (ref 11.5–15.5)
WBC Count: 5.2 10*3/uL (ref 4.0–10.5)
nRBC: 0 % (ref 0.0–0.2)

## 2020-10-16 LAB — CMP (CANCER CENTER ONLY)
ALT: 16 U/L (ref 0–44)
AST: 13 U/L — ABNORMAL LOW (ref 15–41)
Albumin: 2.9 g/dL — ABNORMAL LOW (ref 3.5–5.0)
Alkaline Phosphatase: 52 U/L (ref 38–126)
Anion gap: 10 (ref 5–15)
BUN: 17 mg/dL (ref 8–23)
CO2: 25 mmol/L (ref 22–32)
Calcium: 9.1 mg/dL (ref 8.9–10.3)
Chloride: 101 mmol/L (ref 98–111)
Creatinine: 1.3 mg/dL — ABNORMAL HIGH (ref 0.61–1.24)
GFR, Estimated: 59 mL/min — ABNORMAL LOW (ref >60)
Glucose, Bld: 244 mg/dL — ABNORMAL HIGH (ref 70–99)
Potassium: 4.2 mmol/L (ref 3.5–5.1)
Sodium: 136 mmol/L (ref 135–145)
Total Bilirubin: 0.4 mg/dL (ref 0.3–1.2)
Total Protein: 6.2 g/dL — ABNORMAL LOW (ref 6.5–8.1)

## 2020-10-16 MED ORDER — HEPARIN SOD (PORK) LOCK FLUSH 100 UNIT/ML IV SOLN
500.0000 [IU] | Freq: Once | INTRAVENOUS | Status: AC | PRN
  Administered 2020-10-16: 500 [IU]
  Filled 2020-10-16: qty 5

## 2020-10-16 MED ORDER — SODIUM CHLORIDE 0.9 % IV SOLN
1500.0000 mg | Freq: Once | INTRAVENOUS | Status: AC
  Administered 2020-10-16: 1500 mg via INTRAVENOUS
  Filled 2020-10-16: qty 30

## 2020-10-16 MED ORDER — SODIUM CHLORIDE 0.9% FLUSH
10.0000 mL | INTRAVENOUS | Status: DC | PRN
  Administered 2020-10-16: 10 mL
  Filled 2020-10-16: qty 10

## 2020-10-16 MED ORDER — LIDOCAINE-PRILOCAINE 2.5-2.5 % EX CREA: 1.0000 "application " | TOPICAL_CREAM | CUTANEOUS | 2 refills | Status: DC | PRN

## 2020-10-16 MED ORDER — SODIUM CHLORIDE 0.9 % IV SOLN
Freq: Once | INTRAVENOUS | Status: AC
  Filled 2020-10-16: qty 250

## 2020-10-16 NOTE — Progress Notes (Signed)
Ok to treat with HR today per [employer], PA.

## 2020-10-16 NOTE — Patient Instructions (Signed)
[facility] Discharge Instructions for Patients Receiving Chemotherapy  Today you received the following chemotherapy agents: Imfinzi.  To help prevent nausea and vomiting after your treatment, we encourage you to take your nausea medication as directed.   If you develop nausea and vomiting that is not controlled by your nausea medication, call the clinic.   BELOW ARE SYMPTOMS THAT SHOULD BE REPORTED IMMEDIATELY:  *FEVER GREATER THAN 100.5 F  *CHILLS WITH OR WITHOUT FEVER  NAUSEA AND VOMITING THAT IS NOT CONTROLLED WITH YOUR NAUSEA MEDICATION  *UNUSUAL SHORTNESS OF BREATH  *UNUSUAL BRUISING OR BLEEDING  TENDERNESS IN MOUTH AND THROAT WITH OR WITHOUT PRESENCE OF ULCERS  *URINARY PROBLEMS  *BOWEL PROBLEMS  UNUSUAL RASH Items with * indicate a potential emergency and should be followed up as soon as possible.  Feel free to call the clinic should you have any questions or concerns. The clinic phone number is [phone].  Please show the CHEMO ALERT CARD at check-in to the Emergency Department and triage nurse.   

## 2020-10-26 ENCOUNTER — Ambulatory Visit (INDEPENDENT_AMBULATORY_CARE_PROVIDER_SITE_OTHER): Payer: Medicare Other | Admitting: Primary Care

## 2020-10-26 ENCOUNTER — Other Ambulatory Visit: Payer: Self-pay

## 2020-10-26 VITALS — BP 124/60 | HR 110 | Temp 97.5°F | Ht 69.0 in | Wt 270.8 lb

## 2020-10-26 DIAGNOSIS — C3411 Malignant neoplasm of upper lobe, right bronchus or lung: Secondary | ICD-10-CM

## 2020-10-26 DIAGNOSIS — I10 Essential (primary) hypertension: Secondary | ICD-10-CM | POA: Diagnosis not present

## 2020-10-26 DIAGNOSIS — E785 Hyperlipidemia, unspecified: Secondary | ICD-10-CM | POA: Diagnosis not present

## 2020-10-26 DIAGNOSIS — Z23 Encounter for immunization: Secondary | ICD-10-CM

## 2020-10-26 DIAGNOSIS — N529 Male erectile dysfunction, unspecified: Secondary | ICD-10-CM | POA: Diagnosis not present

## 2020-10-26 DIAGNOSIS — E236 Other disorders of pituitary gland: Secondary | ICD-10-CM

## 2020-10-26 DIAGNOSIS — Z86711 Personal history of pulmonary embolism: Secondary | ICD-10-CM

## 2020-10-26 DIAGNOSIS — Z125 Encounter for screening for malignant neoplasm of prostate: Secondary | ICD-10-CM

## 2020-10-26 DIAGNOSIS — E118 Type 2 diabetes mellitus with unspecified complications: Secondary | ICD-10-CM

## 2020-10-26 DIAGNOSIS — E119 Type 2 diabetes mellitus without complications: Secondary | ICD-10-CM

## 2020-10-26 DIAGNOSIS — E230 Hypopituitarism: Secondary | ICD-10-CM

## 2020-10-26 DIAGNOSIS — E349 Endocrine disorder, unspecified: Secondary | ICD-10-CM

## 2020-10-26 LAB — LIPID PANEL
Cholesterol: 134 mg/dL (ref 0–200)
HDL: 38.4 mg/dL — ABNORMAL LOW (ref >39.00)
NonHDL: 95.73
Total CHOL/HDL Ratio: 3
Triglycerides: 240 mg/dL — ABNORMAL HIGH (ref 0.0–149.0)
VLDL: 48 mg/dL — ABNORMAL HIGH (ref 0.0–40.0)

## 2020-10-26 LAB — POCT GLYCOSYLATED HEMOGLOBIN (HGB A1C): Hemoglobin A1C: 9.9 % — AB (ref 4.0–5.6)

## 2020-10-26 LAB — PSA, MEDICARE: PSA: 2.14 ng/ml (ref 0.10–4.00)

## 2020-10-26 LAB — LDL CHOLESTEROL, DIRECT: Direct LDL: 74 mg/dL

## 2020-10-26 MED ORDER — SILDENAFIL CITRATE 20 MG PO TABS: ORAL_TABLET | ORAL | 0 refills | Status: DC

## 2020-10-26 MED ORDER — BASAGLAR KWIKPEN 100 UNIT/ML ~~LOC~~ SOPN: 20.0000 [IU] | PEN_INJECTOR | Freq: Every day | SUBCUTANEOUS | 3 refills | Status: DC

## 2020-10-26 NOTE — Assessment & Plan Note (Signed)
Diagnosed in June 2021.  Compliant to apixaban 5 mg twice daily for which he will take lifelong.

## 2020-10-26 NOTE — Assessment & Plan Note (Signed)
Improving compared to several weeks ago with glucose readings ranging 150 to mid 200s.  A1c today at 9.9 which is not surprising given that glucose readings were in the 500-600 range just 1 month ago.  Would like to see A1c below 8, glucose readings consistently below 150.  Increase Basaglar to 20 units for now.  Only use sliding scale insulin if needed, hopefully we can discontinue soon.  We discussed the possibility of discontinuing some oral medications if he will be on insulin long-term.  For now we will continue glipizide, Metformin, pioglitazone as prescribed.  He will update regarding glucose readings.  We will plan to see him back in 3 months for diabetes follow-up.

## 2020-10-26 NOTE — Assessment & Plan Note (Signed)
Following with endocrinology, encouraged to set up a follow-up appointment.  Continue supplemental testosterone as prescribed.

## 2020-10-26 NOTE — Patient Instructions (Addendum)
We increased your dose of Basaglar (night time insulin) to 20 units.   Continue to check your blood sugars, they should run consistently less than 150.   Use the sliding scale Novolog if needed. I hope you won't need this any longer.  We will share the ECG/EKG with Dr. Raul Del.  Try sildenafil (Viagra) 20 mg. Take 3-5 tablets by mouth one hour prior to intercourse.   Stop by the lab prior to leaving today. I will notify you of your results once received.  Call Dr. Sherren Mocha office to set up a follow up visit.  Please schedule a follow up appointment in 3 months for diabetes check.  It was a pleasure to see you today!

## 2020-10-26 NOTE — Assessment & Plan Note (Signed)
MRI from 2020 reviewed which does not show change to cyst size.  Following with endocrinology who monitors.  Encouraged patient to set up a follow-up visit with endocrinology.

## 2020-10-26 NOTE — Assessment & Plan Note (Signed)
Well-controlled in the office today, continue lisinopril 5 mg, amlodipine 10 mg.  Recent CMP reviewed.

## 2020-10-26 NOTE — Assessment & Plan Note (Signed)
Following with oncology, pulmonology. Undergoing chemotherapy and radiation.  Overall improving, especially now off of high doses of oral steroids.

## 2020-10-26 NOTE — Assessment & Plan Note (Signed)
Compliant with pravastatin 40 mg, continue same.  Repeat lipids pending.

## 2020-10-26 NOTE — Progress Notes (Signed)
Subjective:    Patient ID: Adam Wise, male    DOB: 03/18/1951, 69 y.o.   MRN: [id number]  HPI  This visit occurred during the SARS-CoV-2 public health emergency.  Safety protocols were in place, including screening questions prior to the visit, additional usage of staff PPE, and extensive cleaning of exam room while observing appropriate contact time as indicated for disinfecting solutions.   Adam Wise is a 69 year old male with a significant medical history including hypertension, pulmonary embolism, small cell lung cancer, GERD, type 2 diabetes, hypogonadism, chronic back pain, chronic fatigue who presents today for follow up.  Following with pulmonology for OSA, dyspnea who is requesting an ECG given his chemotherapy and cancer treatments.  He has an appointment to have this done but would like to have the EKG completed in our office today.  Last visit to pulmonology was in early November 2021 for monitoring of CPAP machine. Now with supplemental oxygen attached to CPAP at night. He is compliant to Anora daily, using albuterol infrequently.  So far he has not noticed improvement with an Anora.  History of pulmonary embolism to the left lung, diagnosed in June 2021. He is complaint to Eliquis 5 mg BID for which he will take lifelong.   For diabetes he is managed on Basaglar 15 units daily and Novolog sliding scale which was initiated a few weeks ago due to extreme hyperglycemia secondary to high doses of oral steroids. He completed his regimen of oral steroids a few weeks ago.   He is checking blood sugars 2-3 times daily which have recently been ranging mid 150's to mid 200's. This is an improvement from the 500-600 range for which he was seeing several weeks ago while on oral steroids.  He is also requesting a refill of his sildenafil. He feels as though the 50 mg dose is ineffective.  He has difficulty obtaining and maintaining an erection.  He has never tried a higher dose than 50  mg.  BP Readings from Last 3 Encounters:  10/26/20 124/60  10/16/20 123/77  10/09/20 140/79     Review of Systems  Constitutional: Negative for fever.  Respiratory: Negative for cough.        Intermittent exertional dyspnea, overall improved  Cardiovascular: Negative for chest pain.  Genitourinary:       Erectile dysfunction  Skin: Negative for color change.  Neurological: Negative for dizziness and headaches.       Past Medical History:  Diagnosis Date  . Cancer ([place])   . Chickenpox   . Chronic knee pain   . Chronic low back pain   . Essential hypertension   . GERD (gastroesophageal reflux disease)   . OSA (obstructive sleep apnea)   . Testosterone deficiency   . Type 2 diabetes mellitus ([place])      Social History   Socioeconomic History  . Marital status: Married    Spouse name: Not on file  . Number of children: Not on file  . Years of education: Not on file  . Highest education level: Not on file  Occupational History  . Not on file  Tobacco Use  . Smoking status: Former Research scientist (life sciences)  . Smokeless tobacco: Never Used  Vaping Use  . Vaping Use: Never used  Substance and Sexual Activity  . Alcohol use: Yes    Comment: rarely  . Drug use: Not Currently  . Sexual activity: Yes  Other Topics Concern  . Not on file  Social History Narrative  Married.   Moved from [place].   Retired.   Social Determinants of Health   Financial Resource Strain: Low Risk   . Difficulty of Paying Living Expenses: Not hard at all  Food Insecurity: No Food Insecurity  . Worried About Charity fundraiser in the Last Year: Never true  . Ran Out of Food in the Last Year: Never true  Transportation Needs: No Transportation Needs  . Lack of Transportation (Medical): No  . Lack of Transportation (Non-Medical): No  Physical Activity: Inactive  . Days of Exercise per Week: 0 days  . Minutes of Exercise per Session: 0 min  Stress: No Stress Concern Present  . Feeling of Stress : Not at  all  Social Connections:   . Frequency of Communication with Friends and Family: Not on file  . Frequency of Social Gatherings with Friends and Family: Not on file  . Attends Religious Services: Not on file  . Active Member of Clubs or Organizations: Not on file  . Attends Archivist Meetings: Not on file  . Marital Status: Not on file  Intimate Partner Violence: Not At Risk  . Fear of Current or Ex-Partner: No  . Emotionally Abused: No  . Physically Abused: No  . Sexually Abused: No    Past Surgical History:  Procedure Laterality Date  . COLONOSCOPY WITH PROPOFOL N/A 12/17/2018   Procedure: COLONOSCOPY WITH PROPOFOL;  Surgeon: Virgel Manifold, MD;  Location: [facility] ENDOSCOPY;  Service: Gastroenterology;  Laterality: N/A;  . IR IMAGING GUIDED PORT INSERTION  04/17/2020  . JOINT REPLACEMENT Bilateral   . REPLACEMENT TOTAL KNEE BILATERAL  2015  . TONSILLECTOMY  1960    Family History  Problem Relation Age of Onset  . Cancer Mother   . Hypertension Mother   . Arthritis Father   . Asthma Father   . Cancer Father   . COPD Father   . Heart attack Father   . Hypertension Sister   . Cancer Sister   . Diabetes Sister   . Asthma Son   . Birth defects Maternal Grandfather   . Arthritis Paternal Grandmother   . Diabetes Paternal Grandmother   . Arthritis Paternal Grandfather   . Asthma Sister   . Cancer Sister   . COPD Sister   . Arthritis Sister   . Asthma Sister   . Diabetes Sister   . Other Neg Hx        pituitary disorder    Allergies  Allergen Reactions  . Bupropion     Racing heart    Current Outpatient Medications on File Prior to Visit  Medication Sig Dispense Refill  . acetaminophen (TYLENOL) 500 MG tablet Take 1,500 mg by mouth every 8 (eight) hours as needed for moderate pain or headache.    . albuterol (VENTOLIN HFA) 108 (90 Base) MCG/ACT inhaler Inhale 2 puffs into the lungs every 6 (six) hours as needed for wheezing or shortness of breath. 18 g  0  . amLODipine (NORVASC) 10 MG tablet TAKE 1 TABLET BY MOUTH EVERY DAY 90 tablet 1  . Black Pepper-Turmeric (TURMERIC COMPLEX/BLACK PEPPER PO) Take 1 tablet by mouth in the morning and at bedtime.    . blood glucose meter kit and supplies KIT Dispense based on patient and insurance preference. Use up to four times daily as directed. 1 each 0  . Coenzyme Q10 ([employer] COQ-10) 200 MG capsule Take 200 mg by mouth every evening.     Marland Kitchen ELIQUIS 5 MG  TABS tablet TAKE 1 TABLET BY MOUTH TWICE A DAY 180 tablet 2  . esomeprazole (NEXIUM) 20 MG capsule Take 20 mg by mouth daily.     . fluticasone (FLONASE) 50 MCG/ACT nasal spray Place 2 sprays into both nostrils daily as needed for allergies.     Marland Kitchen gabapentin (NEURONTIN) 300 MG capsule Take 300 mg by mouth 2 (two) times daily.     Marland Kitchen glipiZIDE (GLUCOTROL) 10 MG tablet TAKE 1 TABLET (10 MG TOTAL) BY MOUTH 2 (TWO) TIMES DAILY BEFORE A MEAL. FOR DIABETES. 180 tablet 2  . insulin aspart (NOVOLOG FLEXPEN) 100 UNIT/ML FlexPen Inject three times daily per sliding scale. 15 mL 0  . KRILL OIL PO Take 350 mg by mouth daily.    . Lancets MISC USE UP TO 3 TIMES DAILY AS DIRECTED 100 each 2  . lidocaine-prilocaine (EMLA) cream Apply 1 application topically as needed. (Patient taking differently: Apply 1 application topically as needed (access port). ) 30 g 0  . lidocaine-prilocaine (EMLA) cream Apply 1 application topically as needed. 30 g 2  . lisinopril (ZESTRIL) 5 MG tablet TAKE 1 TABLET BY MOUTH EVERY DAY 90 tablet 1  . Melatonin 10 MG CAPS Take 10 mg by mouth at bedtime.    . metFORMIN (GLUCOPHAGE) 1000 MG tablet TAKE 1 TABLET (1,000 MG TOTAL) BY MOUTH 2 (TWO) TIMES DAILY WITH A MEAL. 180 tablet 0  . Multiple Vitamin (MULTI-VITAMINS) TABS Take 1 tablet by mouth daily.     Glory Rosebush ULTRA test strip USE UP TO 4 TIMES DAILY AS DIRECTED 100 strip 5  . oxymetazoline (AFRIN) 0.05 % nasal spray Place 1 spray into both nostrils 2 (two) times daily as needed for congestion.     . pioglitazone (ACTOS) 45 MG tablet TAKE 1 TABLET BY MOUTH EVERY DAY 90 tablet 1  . pravastatin (PRAVACHOL) 40 MG tablet TAKE 1 TABLET BY MOUTH EVERY DAY IN THE EVENING FOR CHOLESTEROL 90 tablet 1  . prochlorperazine (COMPAZINE) 10 MG tablet Take 1 tablet (10 mg total) by mouth every 6 (six) hours as needed for nausea or vomiting. 30 tablet 0  . SYRINGE-NEEDLE, DISP, 3 ML (B-D 3CC LUER-LOK SYR 22GX1") 22G X 1" 3 ML MISC USE AS INSTRUCTED FOR TESTOSTERONE INJECTION EVERY 2 WEEKS 10 each 2  . testosterone cypionate (DEPOTESTOSTERONE CYPIONATE) 200 MG/ML injection Inject 200 mg into the muscle every 14 (fourteen) days.    Marland Kitchen umeclidinium-vilanterol (ANORO ELLIPTA) 62.5-25 MCG/INH AEPB Inhale into the lungs.    . chlorpheniramine-HYDROcodone (TUSSIONEX) 10-8 MG/5ML SUER Take 5 mLs by mouth at bedtime as needed for cough. (Patient not taking: Reported on 10/26/2020) 140 mL 0  . HYDROcodone-homatropine (HYCODAN) 5-1.5 MG/5ML syrup Take 5 mLs by mouth at bedtime as needed for cough. (Patient not taking: Reported on 10/26/2020)    . sucralfate (CARAFATE) 1 g tablet DISSOLVE ONE TAB IN 15ML'S OF WATER AND TAKE BY MOUTH FOUR TIMES A DAY AS DIRECTED (Patient not taking: Reported on 10/26/2020) 120 tablet 0   No current facility-administered medications on file prior to visit.    BP 124/60   Pulse (!) 110   Temp (!) 97.5 F (36.4 C) (Temporal)   Ht _0  (1.753 m)   Wt 270 lb 12.8 oz (122.8 kg)   SpO2 93%   BMI 39.99 kg/m    Objective:   Physical Exam Cardiovascular:     Rate and Rhythm: Regular rhythm. Tachycardia present.  Pulmonary:     Effort: Pulmonary effort is  normal.     Breath sounds: Normal breath sounds. No wheezing or rhonchi.  Neurological:     Mental Status: He is alert and oriented to person, place, and time.  Psychiatric:        Mood and Affect: Mood normal.            Assessment & Plan:

## 2020-10-26 NOTE — Assessment & Plan Note (Signed)
Sildenafil at the 50 mg dose ineffective.  We will trial sildenafil 20 mg with instructions to take 3 to 5 tablets 1 hour prior to intercourse.  He will update.

## 2020-10-26 NOTE — Assessment & Plan Note (Signed)
Follows with endocrinology, encouraged patient to set up a follow-up visit as he is overdue.

## 2020-10-29 MED ORDER — BD PEN NEEDLE MICRO U/F 32G X 6 MM MISC: 11 refills | Status: DC

## 2020-11-05 ENCOUNTER — Ambulatory Visit: Payer: Medicare Other

## 2020-11-05 ENCOUNTER — Other Ambulatory Visit: Payer: Medicare Other

## 2020-11-06 ENCOUNTER — Other Ambulatory Visit: Payer: Medicare Other

## 2020-11-06 ENCOUNTER — Ambulatory Visit: Payer: Medicare Other

## 2020-11-06 ENCOUNTER — Ambulatory Visit: Payer: Medicare Other | Admitting: Internal Medicine

## 2020-11-13 ENCOUNTER — Encounter: Payer: Self-pay | Admitting: Internal Medicine

## 2020-11-13 ENCOUNTER — Inpatient Hospital Stay: Payer: Medicare Other

## 2020-11-13 ENCOUNTER — Inpatient Hospital Stay (HOSPITAL_BASED_OUTPATIENT_CLINIC_OR_DEPARTMENT_OTHER): Payer: Medicare Other | Admitting: Internal Medicine

## 2020-11-13 ENCOUNTER — Other Ambulatory Visit: Payer: Self-pay

## 2020-11-13 VITALS — BP 142/94 | HR 110 | Temp 97.6°F | Resp 18 | Ht 69.0 in | Wt 277.3 lb

## 2020-11-13 VITALS — HR 110

## 2020-11-13 DIAGNOSIS — C3411 Malignant neoplasm of upper lobe, right bronchus or lung: Secondary | ICD-10-CM

## 2020-11-13 DIAGNOSIS — Z5112 Encounter for antineoplastic immunotherapy: Secondary | ICD-10-CM

## 2020-11-13 DIAGNOSIS — R5382 Chronic fatigue, unspecified: Secondary | ICD-10-CM

## 2020-11-13 DIAGNOSIS — C3490 Malignant neoplasm of unspecified part of unspecified bronchus or lung: Secondary | ICD-10-CM

## 2020-11-13 DIAGNOSIS — Z95828 Presence of other vascular implants and grafts: Secondary | ICD-10-CM

## 2020-11-13 LAB — CBC WITH DIFFERENTIAL (CANCER CENTER ONLY)
Abs Immature Granulocytes: 0.02 10*3/uL (ref 0.00–0.07)
Basophils Absolute: 0.1 10*3/uL (ref 0.0–0.1)
Basophils Relative: 1 %
Eosinophils Absolute: 0.2 10*3/uL (ref 0.0–0.5)
Eosinophils Relative: 3 %
HCT: 41.6 % (ref 39.0–52.0)
Hemoglobin: 13.2 g/dL (ref 13.0–17.0)
Immature Granulocytes: 0 %
Lymphocytes Relative: 18 %
Lymphs Abs: 0.9 10*3/uL (ref 0.7–4.0)
MCH: 28.6 pg (ref 26.0–34.0)
MCHC: 31.7 g/dL (ref 30.0–36.0)
MCV: 90.2 fL (ref 80.0–100.0)
Monocytes Absolute: 0.5 10*3/uL (ref 0.1–1.0)
Monocytes Relative: 10 %
Neutro Abs: 3.6 10*3/uL (ref 1.7–7.7)
Neutrophils Relative %: 68 %
Platelet Count: 192 10*3/uL (ref 150–400)
RBC: 4.61 MIL/uL (ref 4.22–5.81)
RDW: 14.9 % (ref 11.5–15.5)
WBC Count: 5.3 10*3/uL (ref 4.0–10.5)
nRBC: 0 % (ref 0.0–0.2)

## 2020-11-13 LAB — CMP (CANCER CENTER ONLY)
ALT: 14 U/L (ref 0–44)
AST: 17 U/L (ref 15–41)
Albumin: 3.6 g/dL (ref 3.5–5.0)
Alkaline Phosphatase: 66 U/L (ref 38–126)
Anion gap: 8 (ref 5–15)
BUN: 16 mg/dL (ref 8–23)
CO2: 26 mmol/L (ref 22–32)
Calcium: 9.6 mg/dL (ref 8.9–10.3)
Chloride: 104 mmol/L (ref 98–111)
Creatinine: 1.11 mg/dL (ref 0.61–1.24)
GFR, Estimated: >60 mL/min (ref >60)
Glucose, Bld: 106 mg/dL — ABNORMAL HIGH (ref 70–99)
Potassium: 4.3 mmol/L (ref 3.5–5.1)
Sodium: 138 mmol/L (ref 135–145)
Total Bilirubin: 0.3 mg/dL (ref 0.3–1.2)
Total Protein: 6.6 g/dL (ref 6.5–8.1)

## 2020-11-13 LAB — TSH: TSH: 0.55 u[IU]/mL (ref 0.320–4.118)

## 2020-11-13 MED ORDER — SODIUM CHLORIDE 0.9 % IV SOLN
Freq: Once | INTRAVENOUS | Status: AC
  Filled 2020-11-13: qty 250

## 2020-11-13 MED ORDER — SODIUM CHLORIDE 0.9% FLUSH
10.0000 mL | Freq: Once | INTRAVENOUS | Status: AC
  Administered 2020-11-13: 10 mL
  Filled 2020-11-13: qty 10

## 2020-11-13 MED ORDER — SODIUM CHLORIDE 0.9% FLUSH
10.0000 mL | INTRAVENOUS | Status: DC | PRN
  Administered 2020-11-13: 10 mL
  Filled 2020-11-13: qty 10

## 2020-11-13 MED ORDER — HEPARIN SOD (PORK) LOCK FLUSH 100 UNIT/ML IV SOLN
500.0000 [IU] | Freq: Once | INTRAVENOUS | Status: AC | PRN
  Administered 2020-11-13: 500 [IU]
  Filled 2020-11-13: qty 5

## 2020-11-13 MED ORDER — SODIUM CHLORIDE 0.9 % IV SOLN
1500.0000 mg | Freq: Once | INTRAVENOUS | Status: AC
  Administered 2020-11-13: 1500 mg via INTRAVENOUS
  Filled 2020-11-13: qty 30

## 2020-11-13 NOTE — Progress Notes (Signed)
[place] Telephone:([phone]) [phone]   [phone]  OFFICE PROGRESS NOTE  [name], NP [place] [address]  DIAGNOSIS: Extensive stage (T4, N3, M1c)  small cell lung cancer diagnosed in April 2021 and presented with large right upper lobe/right hilar mass with ipsilateral and contralateral mediastinal and right supraclavicular lymphadenopathy in addition to multiple liver lesios  PRIOR THERAPY: Palliative radiotherapy to the right upper lobe lung mass under the care of Dr. Lisbeth Renshaw  CURRENT THERAPY: Systemic chemotherapy with carboplatin for AUC of 5 on day 1, etoposide 100 mg/M2 on days 1, 2 and 3 in addition to Imfinzi 1500 mg IV every 3 weeks with chemotherapy then every 4 weeks for maintenance if the patient has Wise evidence for progression.  He will also receive Cosela 240 mg/m2 on the days of the chemotherapy.  Status post 8 cycles.  Starting from cycle #5 the patient will be on maintenance treatment with immunotherapy with Imfinzi 1500 mg IV every 4 weeks.  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  INTERVAL HISTORY: Adam Wise 69 y.o. male returns to the clinic today for follow-up visit accompanied by his wife.  The patient is feeling fine today with Wise concerning complaints except for the cough and shortness of breath increased with exertion.  He was seen by his pulmonologist and started on Anoro.  The patient has Wise significant chest pain or hemoptysis.  He denied having any nausea, vomiting, diarrhea or constipation.  He has Wise headache or visual changes.  He tolerated the last cycle of his treatment with Imfinzi fairly well except for the shortness of breath.  He is here today for evaluation before starting cycle #9.   MEDICAL HISTORY: Past Medical History:  Diagnosis Date  . Cancer ([place])   . Chickenpox   . Chronic knee pain   . Chronic low back pain   . Essential  hypertension   . GERD (gastroesophageal reflux disease)   . OSA (obstructive sleep apnea)   . Testosterone deficiency   . Type 2 diabetes mellitus ([facility])     ALLERGIES:  is allergic to bupropion.  MEDICATIONS:  Current Outpatient Medications  Medication Sig Dispense Refill  . acetaminophen (TYLENOL) 500 MG tablet Take 1,500 mg by mouth every 8 (eight) hours as needed for moderate pain or headache.    . albuterol (VENTOLIN HFA) 108 (90 Base) MCG/ACT inhaler Inhale 2 puffs into the lungs every 6 (six) hours as needed for wheezing or shortness of breath. 18 g 0  . amLODipine (NORVASC) 10 MG tablet TAKE 1 TABLET BY MOUTH EVERY DAY 90 tablet 1  . Black Pepper-Turmeric (TURMERIC COMPLEX/BLACK PEPPER PO) Take 1 tablet by mouth in the morning and at bedtime.    . blood glucose meter kit and supplies KIT Dispense based on patient and insurance preference. Use up to four times daily as directed. 1 each 0  . chlorpheniramine-HYDROcodone (TUSSIONEX) 10-8 MG/5ML SUER Take 5 mLs by mouth at bedtime as needed for cough. (Patient not taking: Reported on 10/26/2020) 140 mL 0  . Coenzyme Q10 ([employer]S COQ-10) 200 MG capsule Take 200 mg by mouth every evening.     Marland Kitchen ELIQUIS 5 MG TABS tablet TAKE 1 TABLET BY MOUTH TWICE A DAY 180 tablet 2  . esomeprazole (NEXIUM) 20 MG capsule Take 20 mg by mouth daily.     . fluticasone (FLONASE) 50 MCG/ACT nasal spray Place 2 sprays  into both nostrils daily as needed for allergies.     Marland Kitchen gabapentin (NEURONTIN) 300 MG capsule Take 300 mg by mouth 2 (two) times daily.     Marland Kitchen glipiZIDE (GLUCOTROL) 10 MG tablet TAKE 1 TABLET (10 MG TOTAL) BY MOUTH 2 (TWO) TIMES DAILY BEFORE A MEAL. FOR DIABETES. 180 tablet 2  . HYDROcodone-homatropine (HYCODAN) 5-1.5 MG/5ML syrup Take 5 mLs by mouth at bedtime as needed for cough. (Patient not taking: Reported on 10/26/2020)    . insulin aspart (NOVOLOG FLEXPEN) 100 UNIT/ML FlexPen Inject three times daily per sliding scale. 15 mL 0  . Insulin Glargine  (BASAGLAR KWIKPEN) 100 UNIT/ML Inject 20 Units into the skin at bedtime. For diabetes. 15 mL 3  . Insulin Pen Needle (BD PEN NEEDLE MICRO U/F) 32G X 6 MM MISC Use with insulin as prescribed. 100 each 11  . KRILL OIL PO Take 350 mg by mouth daily.    . Lancets MISC USE UP TO 3 TIMES DAILY AS DIRECTED 100 each 2  . lidocaine-prilocaine (EMLA) cream Apply 1 application topically as needed. (Patient taking differently: Apply 1 application topically as needed (access port). ) 30 g 0  . lidocaine-prilocaine (EMLA) cream Apply 1 application topically as needed. 30 g 2  . lisinopril (ZESTRIL) 5 MG tablet TAKE 1 TABLET BY MOUTH EVERY DAY 90 tablet 1  . Melatonin 10 MG CAPS Take 10 mg by mouth at bedtime.    . metFORMIN (GLUCOPHAGE) 1000 MG tablet TAKE 1 TABLET (1,000 MG TOTAL) BY MOUTH 2 (TWO) TIMES DAILY WITH A MEAL. 180 tablet 0  . Multiple Vitamin (MULTI-VITAMINS) TABS Take 1 tablet by mouth daily.     Glory Rosebush ULTRA test strip USE UP TO 4 TIMES DAILY AS DIRECTED 100 strip 5  . oxymetazoline (AFRIN) 0.05 % nasal spray Place 1 spray into both nostrils 2 (two) times daily as needed for congestion.    . pioglitazone (ACTOS) 45 MG tablet TAKE 1 TABLET BY MOUTH EVERY DAY 90 tablet 1  . pravastatin (PRAVACHOL) 40 MG tablet TAKE 1 TABLET BY MOUTH EVERY DAY IN THE EVENING FOR CHOLESTEROL 90 tablet 1  . prochlorperazine (COMPAZINE) 10 MG tablet Take 1 tablet (10 mg total) by mouth every 6 (six) hours as needed for nausea or vomiting. 30 tablet 0  . sildenafil (REVATIO) 20 MG tablet Take 3-5 tablets by mouth 1 hour prior to intercourse as needed. 50 tablet 0  . sucralfate (CARAFATE) 1 g tablet DISSOLVE ONE TAB IN 15ML'S OF WATER AND TAKE BY MOUTH FOUR TIMES A DAY AS DIRECTED (Patient not taking: Reported on 10/26/2020) 120 tablet 0  . SYRINGE-NEEDLE, DISP, 3 ML (B-D 3CC LUER-LOK SYR 22GX1") 22G X 1" 3 ML MISC USE AS INSTRUCTED FOR TESTOSTERONE INJECTION EVERY 2 WEEKS 10 each 2  . testosterone cypionate  (DEPOTESTOSTERONE CYPIONATE) 200 MG/ML injection Inject 200 mg into the muscle every 14 (fourteen) days.    Marland Kitchen umeclidinium-vilanterol (ANORO ELLIPTA) 62.5-25 MCG/INH AEPB Inhale into the lungs.     Wise current facility-administered medications for this visit.    SURGICAL HISTORY:  Past Surgical History:  Procedure Laterality Date  . COLONOSCOPY WITH PROPOFOL N/A 12/17/2018   Procedure: COLONOSCOPY WITH PROPOFOL;  Surgeon: Virgel Manifold, MD;  Location: [facility] ENDOSCOPY;  Service: Gastroenterology;  Laterality: N/A;  . IR IMAGING GUIDED PORT INSERTION  04/17/2020  . JOINT REPLACEMENT Bilateral   . REPLACEMENT TOTAL KNEE BILATERAL  2015  . TONSILLECTOMY  1960    REVIEW OF SYSTEMS:  A comprehensive review of systems was negative except for: Constitutional: positive for fatigue Respiratory: positive for cough and dyspnea on exertion   PHYSICAL EXAMINATION: General appearance: alert, cooperative, fatigued and Wise distress Head: atraumatic Neck: Wise adenopathy, Wise JVD, supple, symmetrical, trachea midline and thyroid not enlarged, symmetric, Wise tenderness/mass/nodules Lymph nodes: Cervical, supraclavicular, and axillary nodes normal. Resp: clear to auscultation bilaterally Back: symmetric, Wise curvature. ROM normal. Wise CVA tenderness. Cardio: regular rate and rhythm, S1, S2 normal, Wise murmur, click, rub or gallop GI: soft, non-tender; bowel sounds normal; Wise masses,  Wise organomegaly Extremities: extremities normal, atraumatic, Wise cyanosis or edema  ECOG PERFORMANCE STATUS: 1 - Symptomatic but completely ambulatory  Blood pressure (!) 142/94, pulse (!) 110, temperature 97.6 F (36.4 C), temperature source Tympanic, resp. rate 18, height $RemoveBe'5\' 9"'[id number]$  (1.753 m), weight 277 lb 4.8 oz (125.8 kg), SpO2 95 %.  LABORATORY DATA: Lab Results  Component Value Date   WBC 5.3 11/13/2020   HGB 13.2 11/13/2020   HCT 41.6 11/13/2020   MCV 90.2 11/13/2020   PLT 192 11/13/2020      Chemistry       Component Value Date/Time   NA 136 10/16/2020 0855   K 4.2 10/16/2020 0855   CL 101 10/16/2020 0855   CO2 25 10/16/2020 0855   BUN 17 10/16/2020 0855   CREATININE 1.30 (H) 10/16/2020 0855      Component Value Date/Time   CALCIUM 9.1 10/16/2020 0855   ALKPHOS 52 10/16/2020 0855   AST 13 (L) 10/16/2020 0855   ALT 16 10/16/2020 0855   BILITOT 0.4 10/16/2020 0855       RADIOGRAPHIC STUDIES: Wise results found.  ASSESSMENT AND PLAN: This is a very pleasant 69 years old white male recently diagnosed with extensive stage (T4, N3, M1 C) small cell lung cancer presented with large right upper lobe lung mass in addition to mediastinal and right supraclavicular lymphadenopathy and multiple metastatic liver lesions diagnosed in April 2021. The patient is currently undergoing systemic chemotherapy with carboplatin for AUC of 5 on day 1, etoposide 100 mg/M2 on days 1, 2 and 3 in addition to Cosela for myeloprotection during the days of the chemotherapy.  He will also receive immunotherapy with Imfinzi on day one of the chemotherapy cycle.  Status post 8 cycles.  Starting from cycle #5 he is on maintenance treatment with Imfinzi 1500 mg IV every 4 weeks. The patient has been tolerating this treatment well with Wise concerning adverse effect except for the worsening cough and shortness of breath recently.  He also gained around 7 pounds which is not helping with his shortness of breath. I recommended for him to proceed with cycle #9 today as planned. I will see him back for follow-up visit in 4 weeks for evaluation with repeat CT scan of the chest, abdomen pelvis for restaging of his disease. The patient was advised to call immediately if he has any concerning symptoms in the interval. The patient voices understanding of current disease status and treatment options and is in agreement with the current care plan.  All questions were answered. The patient knows to call the clinic with any problems,  questions or concerns. We can certainly see the patient much sooner if necessary.  Disclaimer: This note was dictated with voice recognition software. Similar sounding words can inadvertently be transcribed and may not be corrected upon review.

## 2020-11-13 NOTE — Progress Notes (Signed)
Per dr. Julien Nordmann OK to treat with HR 110

## 2020-11-13 NOTE — Patient Instructions (Signed)
[facility] Discharge Instructions for Patients Receiving Chemotherapy  Today you received the following chemotherapy agents: Imfinzi.  To help prevent nausea and vomiting after your treatment, we encourage you to take your nausea medication as directed.   If you develop nausea and vomiting that is not controlled by your nausea medication, call the clinic.   BELOW ARE SYMPTOMS THAT SHOULD BE REPORTED IMMEDIATELY:  *FEVER GREATER THAN 100.5 F  *CHILLS WITH OR WITHOUT FEVER  NAUSEA AND VOMITING THAT IS NOT CONTROLLED WITH YOUR NAUSEA MEDICATION  *UNUSUAL SHORTNESS OF BREATH  *UNUSUAL BRUISING OR BLEEDING  TENDERNESS IN MOUTH AND THROAT WITH OR WITHOUT PRESENCE OF ULCERS  *URINARY PROBLEMS  *BOWEL PROBLEMS  UNUSUAL RASH Items with * indicate a potential emergency and should be followed up as soon as possible.  Feel free to call the clinic should you have any questions or concerns. The clinic phone number is [phone].  Please show the CHEMO ALERT CARD at check-in to the Emergency Department and triage nurse.   

## 2020-11-30 ENCOUNTER — Ambulatory Visit: Payer: Medicare Other

## 2020-12-10 ENCOUNTER — Other Ambulatory Visit: Payer: Self-pay

## 2020-12-10 ENCOUNTER — Other Ambulatory Visit: Payer: Self-pay | Admitting: Primary Care

## 2020-12-10 ENCOUNTER — Ambulatory Visit (HOSPITAL_COMMUNITY)
Admission: RE | Admit: 2020-12-10 | Discharge: 2020-12-10 | Disposition: A | Discharge status: Home / Self Care | Payer: Medicare Other | Source: Ambulatory Visit | Attending: Internal Medicine | Admitting: Internal Medicine

## 2020-12-10 DIAGNOSIS — C3490 Malignant neoplasm of unspecified part of unspecified bronchus or lung: Secondary | ICD-10-CM | POA: Insufficient documentation

## 2020-12-10 DIAGNOSIS — E119 Type 2 diabetes mellitus without complications: Secondary | ICD-10-CM

## 2020-12-10 MED ORDER — HEPARIN SOD (PORK) LOCK FLUSH 100 UNIT/ML IV SOLN
INTRAVENOUS | Status: AC
  Filled 2020-12-10: qty 5

## 2020-12-10 MED ORDER — IOHEXOL 300 MG/ML  SOLN
100.0000 mL | Freq: Once | INTRAMUSCULAR | Status: AC | PRN
  Administered 2020-12-10: 100 mL via INTRAVENOUS

## 2020-12-10 NOTE — Telephone Encounter (Signed)
Ok to make changes?

## 2020-12-10 NOTE — Telephone Encounter (Signed)
Ok to make change

## 2020-12-11 ENCOUNTER — Inpatient Hospital Stay: Payer: Medicare Other

## 2020-12-11 ENCOUNTER — Encounter: Payer: Self-pay | Admitting: Internal Medicine

## 2020-12-11 ENCOUNTER — Other Ambulatory Visit: Payer: Self-pay

## 2020-12-11 ENCOUNTER — Inpatient Hospital Stay: Payer: Medicare Other | Attending: Internal Medicine | Admitting: Internal Medicine

## 2020-12-11 VITALS — BP 137/83 | HR 92 | Temp 98.2°F | Resp 18 | Ht 69.0 in | Wt 276.3 lb

## 2020-12-11 DIAGNOSIS — R5382 Chronic fatigue, unspecified: Secondary | ICD-10-CM

## 2020-12-11 DIAGNOSIS — C787 Secondary malignant neoplasm of liver and intrahepatic bile duct: Secondary | ICD-10-CM | POA: Insufficient documentation

## 2020-12-11 DIAGNOSIS — E119 Type 2 diabetes mellitus without complications: Secondary | ICD-10-CM | POA: Insufficient documentation

## 2020-12-11 DIAGNOSIS — Z794 Long term (current) use of insulin: Secondary | ICD-10-CM | POA: Insufficient documentation

## 2020-12-11 DIAGNOSIS — C3411 Malignant neoplasm of upper lobe, right bronchus or lung: Secondary | ICD-10-CM | POA: Diagnosis not present

## 2020-12-11 DIAGNOSIS — M545 Low back pain, unspecified: Secondary | ICD-10-CM

## 2020-12-11 DIAGNOSIS — Z79899 Other long term (current) drug therapy: Secondary | ICD-10-CM | POA: Insufficient documentation

## 2020-12-11 DIAGNOSIS — Z95828 Presence of other vascular implants and grafts: Secondary | ICD-10-CM | POA: Diagnosis not present

## 2020-12-11 DIAGNOSIS — Z7189 Other specified counseling: Secondary | ICD-10-CM

## 2020-12-11 DIAGNOSIS — K219 Gastro-esophageal reflux disease without esophagitis: Secondary | ICD-10-CM | POA: Insufficient documentation

## 2020-12-11 DIAGNOSIS — I10 Essential (primary) hypertension: Secondary | ICD-10-CM | POA: Insufficient documentation

## 2020-12-11 DIAGNOSIS — R059 Cough, unspecified: Secondary | ICD-10-CM | POA: Diagnosis not present

## 2020-12-11 DIAGNOSIS — Z5112 Encounter for antineoplastic immunotherapy: Secondary | ICD-10-CM

## 2020-12-11 DIAGNOSIS — G4730 Sleep apnea, unspecified: Secondary | ICD-10-CM | POA: Diagnosis not present

## 2020-12-11 DIAGNOSIS — G8929 Other chronic pain: Secondary | ICD-10-CM

## 2020-12-11 DIAGNOSIS — Z23 Encounter for immunization: Secondary | ICD-10-CM | POA: Insufficient documentation

## 2020-12-11 DIAGNOSIS — I2510 Atherosclerotic heart disease of native coronary artery without angina pectoris: Secondary | ICD-10-CM | POA: Insufficient documentation

## 2020-12-11 DIAGNOSIS — Z7901 Long term (current) use of anticoagulants: Secondary | ICD-10-CM | POA: Insufficient documentation

## 2020-12-11 DIAGNOSIS — Z5111 Encounter for antineoplastic chemotherapy: Secondary | ICD-10-CM

## 2020-12-11 DIAGNOSIS — J90 Pleural effusion, not elsewhere classified: Secondary | ICD-10-CM | POA: Insufficient documentation

## 2020-12-11 LAB — CMP (CANCER CENTER ONLY)
ALT: 19 U/L (ref 0–44)
AST: 19 U/L (ref 15–41)
Albumin: 3.7 g/dL (ref 3.5–5.0)
Alkaline Phosphatase: 68 U/L (ref 38–126)
Anion gap: 5 (ref 5–15)
BUN: 17 mg/dL (ref 8–23)
CO2: 28 mmol/L (ref 22–32)
Calcium: 9.2 mg/dL (ref 8.9–10.3)
Chloride: 104 mmol/L (ref 98–111)
Creatinine: 1.22 mg/dL (ref 0.61–1.24)
GFR, Estimated: >60 mL/min (ref >60)
Glucose, Bld: 161 mg/dL — ABNORMAL HIGH (ref 70–99)
Potassium: 4.5 mmol/L (ref 3.5–5.1)
Sodium: 137 mmol/L (ref 135–145)
Total Bilirubin: 0.3 mg/dL (ref 0.3–1.2)
Total Protein: 6.3 g/dL — ABNORMAL LOW (ref 6.5–8.1)

## 2020-12-11 LAB — CBC WITH DIFFERENTIAL (CANCER CENTER ONLY)
Abs Immature Granulocytes: 0.02 10*3/uL (ref 0.00–0.07)
Basophils Absolute: 0.1 10*3/uL (ref 0.0–0.1)
Basophils Relative: 1 %
Eosinophils Absolute: 0.1 10*3/uL (ref 0.0–0.5)
Eosinophils Relative: 3 %
HCT: 42.5 % (ref 39.0–52.0)
Hemoglobin: 13.8 g/dL (ref 13.0–17.0)
Immature Granulocytes: 0 %
Lymphocytes Relative: 16 %
Lymphs Abs: 0.8 10*3/uL (ref 0.7–4.0)
MCH: 28.8 pg (ref 26.0–34.0)
MCHC: 32.5 g/dL (ref 30.0–36.0)
MCV: 88.5 fL (ref 80.0–100.0)
Monocytes Absolute: 0.4 10*3/uL (ref 0.1–1.0)
Monocytes Relative: 8 %
Neutro Abs: 3.5 10*3/uL (ref 1.7–7.7)
Neutrophils Relative %: 72 %
Platelet Count: 170 10*3/uL (ref 150–400)
RBC: 4.8 MIL/uL (ref 4.22–5.81)
RDW: 14.2 % (ref 11.5–15.5)
WBC Count: 4.9 10*3/uL (ref 4.0–10.5)
nRBC: 0 % (ref 0.0–0.2)

## 2020-12-11 LAB — TSH: TSH: 0.515 u[IU]/mL (ref 0.320–4.118)

## 2020-12-11 MED ORDER — SODIUM CHLORIDE 0.9% FLUSH
10.0000 mL | Freq: Once | INTRAVENOUS | Status: AC
  Administered 2020-12-11: 10 mL
  Filled 2020-12-11: qty 10

## 2020-12-11 MED ORDER — HEPARIN SOD (PORK) LOCK FLUSH 100 UNIT/ML IV SOLN
500.0000 [IU] | Freq: Once | INTRAVENOUS | Status: AC
Start: 2020-12-11 — End: 2020-12-11
  Administered 2020-12-11: 500 [IU]
  Filled 2020-12-11: qty 5

## 2020-12-11 MED ORDER — HYDROCOD POLST-CPM POLST ER 10-8 MG/5ML PO SUER: 5.0000 mL | Freq: Every evening | ORAL | 0 refills | Status: DC | PRN

## 2020-12-11 NOTE — Progress Notes (Signed)
[employer] [phone]   [phone]  OFFICE PROGRESS NOTE  [name], NP [place] [address]  DIAGNOSIS: Relapsed extensive stage (T4, N3, M1c)  small cell lung cancer diagnosed in April 2021 and presented with large right upper lobe/right hilar mass with ipsilateral and contralateral mediastinal and right supraclavicular lymphadenopathy in addition to multiple liver lesios. He has disease progression after the first line of chemotherapy in December 2021.  PRIOR THERAPY:  1) Palliative radiotherapy to the right upper lobe lung mass under the care of Dr. Lisbeth Renshaw. 2) Systemic chemotherapy with carboplatin for AUC of 5 on day 1, etoposide 100 mg/M2 on days 1, 2 and 3 in addition to Imfinzi 1500 mg IV every 3 weeks with chemotherapy then every 4 weeks for maintenance if the patient has no evidence for progression.  He will also receive Cosela 240 mg/m2 on the days of the chemotherapy.  Status post 9 cycles.  Starting from cycle #5 the patient will be on maintenance treatment with immunotherapy with Imfinzi 1500 mg IV every 4 weeks. Last dose of chemotherapy was given on November 13, 2020. This treatment was discontinued secondary to disease progression.  CURRENT THERAPY: Systemic chemotherapy with carboplatin for AUC of 5 on day 1, etoposide 100 mg/M2 on days 1, 2 and 3, Tecentriq 1200 mg IV every 3 weeks as well as Cosela 250 mg/M2 on the days of the chemotherapy every 3 weeks.  First dose December 18, 2020.  INTERVAL HISTORY: Adam Wise 69 y.o. male returns to the clinic today for follow-up visit accompanied by his wife.  The patient is feeling fine today with no concerning complaints except for the persistent cough and occasional pain on the right lower rib cage.  The patient also has occasional back pain secondary to degenerative disc disease.  He is requesting refill of Tussionex today.  The patient denied having any current  nausea, vomiting, diarrhea or constipation.  He continues to have shortness of breath with exertion with no hemoptysis.  He has no fever or chills.  He denied having any headache or visual changes.  He has no recent weight loss or night sweats.  He has been tolerating his treatment with maintenance immunotherapy fairly well.  The patient had repeat CT scan of the chest, abdomen pelvis performed recently and is here for evaluation and discussion of his scan results.   MEDICAL HISTORY: Past Medical History:  Diagnosis Date  . Cancer ([facility] [place])   . Chickenpox   . Chronic knee pain   . Chronic low back pain   . Essential hypertension   . GERD (gastroesophageal reflux disease)   . OSA (obstructive sleep apnea)   . Testosterone deficiency   . Type 2 diabetes mellitus ([facility])     ALLERGIES:  is allergic to bupropion.  MEDICATIONS:  Current Outpatient Medications  Medication Sig Dispense Refill  . albuterol (VENTOLIN HFA) 108 (90 Base) MCG/ACT inhaler Inhale 2 puffs into the lungs every 6 (six) hours as needed for wheezing or shortness of breath. 18 g 0  . amLODipine (NORVASC) 10 MG tablet TAKE 1 TABLET BY MOUTH EVERY DAY 90 tablet 1  . Black Pepper-Turmeric (TURMERIC COMPLEX/BLACK PEPPER PO) Take 1 tablet by mouth in the morning and at bedtime.    . blood glucose meter kit and supplies KIT Dispense based on patient and insurance preference. Use up to four times daily as directed. 1 each 0  . chlorpheniramine-HYDROcodone (  TUSSIONEX) 10-8 MG/5ML SUER Take 5 mLs by mouth at bedtime as needed for cough. 140 mL 0  . Coenzyme Q10 ([employer] COQ-10) 200 MG capsule Take 200 mg by mouth every evening.     Marland Kitchen ELIQUIS 5 MG TABS tablet TAKE 1 TABLET BY MOUTH TWICE A DAY 180 tablet 2  . esomeprazole (NEXIUM) 20 MG capsule Take 20 mg by mouth daily.     Marland Kitchen gabapentin (NEURONTIN) 300 MG capsule Take 300 mg by mouth 2 (two) times daily.     Marland Kitchen glipiZIDE (GLUCOTROL) 10 MG tablet TAKE 1 TABLET (10 MG TOTAL) BY MOUTH 2 (TWO)  TIMES DAILY BEFORE A MEAL. FOR DIABETES. 180 tablet 2  . Insulin Glargine (BASAGLAR KWIKPEN) 100 UNIT/ML Inject 20 Units into the skin at bedtime. For diabetes. 15 mL 3  . Insulin Pen Needle (BD PEN NEEDLE MICRO U/F) 32G X 6 MM MISC Use with insulin as prescribed. 100 each 11  . KRILL OIL PO Take 350 mg by mouth daily.    . Lancets MISC USE UP TO 3 TIMES DAILY AS DIRECTED 100 each 2  . lidocaine-prilocaine (EMLA) cream Apply 1 application topically as needed. 30 g 2  . lisinopril (ZESTRIL) 5 MG tablet TAKE 1 TABLET BY MOUTH EVERY DAY 90 tablet 1  . Melatonin 10 MG CAPS Take 10 mg by mouth at bedtime.    . metFORMIN (GLUCOPHAGE) 1000 MG tablet TAKE 1 TABLET (1,000 MG TOTAL) BY MOUTH 2 (TWO) TIMES DAILY WITH A MEAL. 180 tablet 0  . Multiple Vitamin (MULTI-VITAMINS) TABS Take 1 tablet by mouth daily.     Glory Rosebush ULTRA test strip USE UP TO 4 TIMES DAILY AS DIRECTED 100 strip 5  . oxymetazoline (AFRIN) 0.05 % nasal spray Place 1 spray into both nostrils 2 (two) times daily as needed for congestion.    . pioglitazone (ACTOS) 45 MG tablet TAKE 1 TABLET BY MOUTH EVERY DAY 90 tablet 1  . pravastatin (PRAVACHOL) 40 MG tablet TAKE 1 TABLET BY MOUTH EVERY DAY IN THE EVENING FOR CHOLESTEROL 90 tablet 1  . SYRINGE-NEEDLE, DISP, 3 ML (B-D 3CC LUER-LOK SYR 22GX1") 22G X 1" 3 ML MISC USE AS INSTRUCTED FOR TESTOSTERONE INJECTION EVERY 2 WEEKS 10 each 2  . testosterone cypionate (DEPOTESTOSTERONE CYPIONATE) 200 MG/ML injection Inject 200 mg into the muscle every 14 (fourteen) days.    Marland Kitchen acetaminophen (TYLENOL) 500 MG tablet Take 1,500 mg by mouth every 8 (eight) hours as needed for moderate pain or headache. (Patient not taking: Reported on 12/11/2020)    . fluticasone (FLONASE) 50 MCG/ACT nasal spray Place 2 sprays into both nostrils daily as needed for allergies.  (Patient not taking: Reported on 12/11/2020)    . insulin aspart (NOVOLOG FLEXPEN) 100 UNIT/ML FlexPen Inject three times daily per sliding scale.  (Patient not taking: Reported on 12/11/2020) 15 mL 0  . prochlorperazine (COMPAZINE) 10 MG tablet Take 1 tablet (10 mg total) by mouth every 6 (six) hours as needed for nausea or vomiting. (Patient not taking: Reported on 12/11/2020) 30 tablet 0  . sildenafil (REVATIO) 20 MG tablet Take 3-5 tablets by mouth 1 hour prior to intercourse as needed. 50 tablet 0   No current facility-administered medications for this visit.    SURGICAL HISTORY:  Past Surgical History:  Procedure Laterality Date  . COLONOSCOPY WITH PROPOFOL N/A 12/17/2018   Procedure: COLONOSCOPY WITH PROPOFOL;  Surgeon: Virgel Manifold, MD;  Location: [facility] ENDOSCOPY;  Service: Gastroenterology;  Laterality: N/A;  .  IR IMAGING GUIDED PORT INSERTION  04/17/2020  . JOINT REPLACEMENT Bilateral   . REPLACEMENT TOTAL KNEE BILATERAL  2015  . TONSILLECTOMY  1960    REVIEW OF SYSTEMS:  Constitutional: positive for fatigue Eyes: negative Ears, nose, mouth, throat, and face: negative Respiratory: positive for cough, dyspnea on exertion and pleurisy/chest pain Cardiovascular: negative Gastrointestinal: negative Genitourinary:negative Integument/breast: negative Hematologic/lymphatic: negative Musculoskeletal:positive for back pain Neurological: negative Behavioral/Psych: negative Endocrine: negative Allergic/Immunologic: negative   PHYSICAL EXAMINATION: General appearance: alert, cooperative, fatigued and no distress Head: atraumatic Neck: no adenopathy, no JVD, supple, symmetrical, trachea midline and thyroid not enlarged, symmetric, no tenderness/mass/nodules Lymph nodes: Cervical, supraclavicular, and axillary nodes normal. Resp: clear to auscultation bilaterally Back: symmetric, no curvature. ROM normal. No CVA tenderness. Cardio: regular rate and rhythm, S1, S2 normal, no murmur, click, rub or gallop GI: soft, non-tender; bowel sounds normal; no masses,  no organomegaly Extremities: extremities normal, atraumatic, no  cyanosis or edema Neurologic: Alert and oriented X 3, normal strength and tone. Normal symmetric reflexes. Normal coordination and gait  ECOG PERFORMANCE STATUS: 1 - Symptomatic but completely ambulatory  Blood pressure 137/83, pulse 92, temperature 98.2 F (36.8 C), temperature source Tympanic, resp. rate 18, height _0  (1.753 m), weight 276 lb 4.8 oz (125.3 kg), SpO2 97 %.  LABORATORY DATA: Lab Results  Component Value Date   WBC 4.9 12/11/2020   HGB 13.8 12/11/2020   HCT 42.5 12/11/2020   MCV 88.5 12/11/2020   PLT 170 12/11/2020      Chemistry      Component Value Date/Time   NA 137 12/11/2020 1110   K 4.5 12/11/2020 1110   CL 104 12/11/2020 1110   CO2 28 12/11/2020 1110   BUN 17 12/11/2020 1110   CREATININE 1.22 12/11/2020 1110      Component Value Date/Time   CALCIUM 9.2 12/11/2020 1110   ALKPHOS 68 12/11/2020 1110   AST 19 12/11/2020 1110   ALT 19 12/11/2020 1110   BILITOT 0.3 12/11/2020 1110       RADIOGRAPHIC STUDIES: CT Chest W Contrast  Result Date: 12/10/2020 CLINICAL DATA:  Small cell lung cancer, for restaging EXAM: CT CHEST, ABDOMEN, AND PELVIS WITH CONTRAST TECHNIQUE: Multidetector CT imaging of the chest, abdomen and pelvis was performed following the standard protocol during bolus administration of intravenous contrast. CONTRAST:  116m OMNIPAQUE IOHEXOL 300 MG/ML  SOLN COMPARISON:  CT chest dated 10/10/2020. CT chest abdomen pelvis dated 09/06/2020. FINDINGS: CT CHEST FINDINGS Cardiovascular: The heart is normal in size. No pericardial effusion. No evidence of thoracic aortic aneurysm. Mild atherosclerotic calcifications of the aortic arch. Coronary atherosclerosis the LAD and left circumflex. Right chest port terminates the cavoatrial junction. Mediastinum/Nodes: 10 mm subcarinal node (series 2/image 27). Low-density soft tissue in the low right paratracheal region (series 2/image 24) favors fluid. Additional bilobed fluid in the right epicardial region  (series 2/image 36), unchanged. Visualized thyroid is unremarkable. Lungs/Pleura: Radiation changes in the medial right upper lobe and central right lower lobe/perihilar region. Volume loss in the right hemithorax. Moderate right pleural effusion, increased. Left lung is clear.  No suspicious pulmonary nodules. No pneumothorax. Musculoskeletal: Degenerative changes of the thoracic spine. CT ABDOMEN PELVIS FINDINGS Hepatobiliary: Progressive multifocal hepatic metastases in both lobes, including: --3.1 cm lesion in segment 4A (series 2/image 50) --3.2 cm lesion in segment 3 (series 2/image 56) --3.3 cm lesion in the caudate (series 2/image 56) --2.8 cm lesion in segment 5 (series 2/image 57) Hepatic metastases were difficult to visualize on  recent CT chest and measured less than 10 mm on prior CT abdomen/pelvis. Gallbladder is unremarkable. No intrahepatic or extrahepatic ductal dilatation. Pancreas: Within normal limits. Spleen: Within normal limits. Adrenals/Urinary Tract: Adrenal glands are within normal limits. Right renal cysts measuring up to 6.7 cm (series 2/image 34). Left kidney is within normal limits. No hydronephrosis. Bladder is within normal limits. Stomach/Bowel: Stomach is within normal limits. Moderate duodenal diverticulum. No evidence of bowel obstruction. Normal appendix (series 2/image 88). No colonic wall thickening or mass is evident on CT. Vascular/Lymphatic: No evidence of abdominal aortic aneurysm. Atherosclerotic calcifications of the abdominal aorta and branch vessels. No suspicious abdominopelvic lymphadenopathy. Reproductive: Mild prostatomegaly. Other: No abdominopelvic ascites. Musculoskeletal: Degenerative changes of the lumbar spine. IMPRESSION: Radiation changes in the right hemithorax. Moderate right pleural effusion, increased. Progressive multifocal hepatic metastases in both lobes, with index lesions as above. Additional ancillary findings as above. Electronically Signed   By:  Julian Hy M.D.   On: 12/10/2020 14:28   CT Abdomen Pelvis W Contrast  Result Date: 12/10/2020 CLINICAL DATA:  Small cell lung cancer, for restaging EXAM: CT CHEST, ABDOMEN, AND PELVIS WITH CONTRAST TECHNIQUE: Multidetector CT imaging of the chest, abdomen and pelvis was performed following the standard protocol during bolus administration of intravenous contrast. CONTRAST:  154m OMNIPAQUE IOHEXOL 300 MG/ML  SOLN COMPARISON:  CT chest dated 10/10/2020. CT chest abdomen pelvis dated 09/06/2020. FINDINGS: CT CHEST FINDINGS Cardiovascular: The heart is normal in size. No pericardial effusion. No evidence of thoracic aortic aneurysm. Mild atherosclerotic calcifications of the aortic arch. Coronary atherosclerosis the LAD and left circumflex. Right chest port terminates the cavoatrial junction. Mediastinum/Nodes: 10 mm subcarinal node (series 2/image 27). Low-density soft tissue in the low right paratracheal region (series 2/image 24) favors fluid. Additional bilobed fluid in the right epicardial region (series 2/image 36), unchanged. Visualized thyroid is unremarkable. Lungs/Pleura: Radiation changes in the medial right upper lobe and central right lower lobe/perihilar region. Volume loss in the right hemithorax. Moderate right pleural effusion, increased. Left lung is clear.  No suspicious pulmonary nodules. No pneumothorax. Musculoskeletal: Degenerative changes of the thoracic spine. CT ABDOMEN PELVIS FINDINGS Hepatobiliary: Progressive multifocal hepatic metastases in both lobes, including: --3.1 cm lesion in segment 4A (series 2/image 50) --3.2 cm lesion in segment 3 (series 2/image 56) --3.3 cm lesion in the caudate (series 2/image 56) --2.8 cm lesion in segment 5 (series 2/image 57) Hepatic metastases were difficult to visualize on recent CT chest and measured less than 10 mm on prior CT abdomen/pelvis. Gallbladder is unremarkable. No intrahepatic or extrahepatic ductal dilatation. Pancreas: Within  normal limits. Spleen: Within normal limits. Adrenals/Urinary Tract: Adrenal glands are within normal limits. Right renal cysts measuring up to 6.7 cm (series 2/image 34). Left kidney is within normal limits. No hydronephrosis. Bladder is within normal limits. Stomach/Bowel: Stomach is within normal limits. Moderate duodenal diverticulum. No evidence of bowel obstruction. Normal appendix (series 2/image 88). No colonic wall thickening or mass is evident on CT. Vascular/Lymphatic: No evidence of abdominal aortic aneurysm. Atherosclerotic calcifications of the abdominal aorta and branch vessels. No suspicious abdominopelvic lymphadenopathy. Reproductive: Mild prostatomegaly. Other: No abdominopelvic ascites. Musculoskeletal: Degenerative changes of the lumbar spine. IMPRESSION: Radiation changes in the right hemithorax. Moderate right pleural effusion, increased. Progressive multifocal hepatic metastases in both lobes, with index lesions as above. Additional ancillary findings as above. Electronically Signed   By: SJulian HyM.D.   On: 12/10/2020 14:28    ASSESSMENT AND PLAN: This is a very pleasant 69  years old white male recently diagnosed with extensive stage (T4, N3, M1 C) small cell lung cancer presented with large right upper lobe lung mass in addition to mediastinal and right supraclavicular lymphadenopathy and multiple metastatic liver lesions diagnosed in April 2021. The patient underwent systemic chemotherapy with carboplatin for AUC of 5 on day 1, etoposide 100 mg/M2 on days 1, 2 and 3 in addition to Cosela for myeloprotection during the days of the chemotherapy.  He will also receive immunotherapy with Imfinzi on day one of the chemotherapy cycle.  Status post 9 cycles.  Starting from cycle #5 he is on maintenance treatment with Imfinzi 1500 mg IV every 4 weeks. The patient has been tolerating this treatment well with no concerning adverse effect except for the worsening cough and shortness of  breath recently.   He had repeat CT scan of the chest, abdomen pelvis performed recently.  I personally and independently reviewed the scan images and discussed the results with the patient and his wife today. Unfortunately his scan showed evidence for disease progression with increase in the right pleural effusion as well as enlargement of multiple liver lesions. I recommended for the patient to discontinue his current maintenance treatment with Imfinzi at this point. I discussed with the patient several options for management of his condition including palliative care versus resuming systemic chemotherapy with the initial treatment with carboplatin and etoposide as well as immunotherapy with Tecentriq as well as Cosela for Myeloprotection.  His last treatment with chemotherapy was around 6 months ago.  The patient is interested in resuming his treatment with chemotherapy again. I reminded him of the adverse effect of this treatment including but not limited to alopecia, myelosuppression, nausea and vomiting, peripheral neuropathy, liver or renal dysfunction as well as the adverse effect of the immunotherapy. The patient is expected to start the first cycle of this treatment on December 19, 2019. For the recurrent right pleural effusion, I will send the patient to IR for consideration of ultrasound-guided therapeutic thoracentesis. For the persistent cough, I will give him refill of Tussionex. The patient will come back for follow-up visit in 2 weeks for evaluation and management of any adverse effect of his treatment. He was advised to call immediately if he has any other concerning symptoms in the interval. The patient voices understanding of current disease status and treatment options and is in agreement with the current care plan.  All questions were answered. The patient knows to call the clinic with any problems, questions or concerns. We can certainly see the patient much sooner if  necessary.  Disclaimer: This note was dictated with voice recognition software. Similar sounding words can inadvertently be transcribed and may not be corrected upon review.

## 2020-12-11 NOTE — Patient Instructions (Signed)

## 2020-12-11 NOTE — Progress Notes (Signed)
DISCONTINUE ON PATHWAY REGIMEN - Small Cell Lung     Cycles 1 through 4: A cycle is every 21 days:     Durvalumab      Carboplatin      Etoposide    Cycles 5 and beyond: A cycle is every 28 days:     Durvalumab   **Always confirm dose/schedule in your pharmacy ordering system**  REASON: Disease Progression PRIOR TREATMENT: LOS420: Durvalumab 1,500 mg D1 + Carboplatin AUC=5 D1 + Etoposide 100 mg/m2 D1-3 q21 Days x 4 Cycles, Followed by Durvalumab 1,500 mg q28 Days Until Progression or Unacceptable Toxicity TREATMENT RESPONSE: Partial Response (PR)  START ON PATHWAY REGIMEN - Small Cell Lung     Cycles 1 through 4, every 21 days:     Atezolizumab      Carboplatin      Etoposide    Cycles 5 and beyond, every 21 days:     Atezolizumab   **Always confirm dose/schedule in your pharmacy ordering system**  Patient Characteristics: Relapsed or Progressive Disease, Second Line, Relapse > 6 Months Therapeutic Status: Relapsed or Progressive Disease Line of Therapy: Second Line Time to Relapse: Relapse > 6 Months Intent of Therapy: Non-Curative / Palliative Intent, Discussed with Patient

## 2020-12-17 ENCOUNTER — Telehealth: Payer: Self-pay | Admitting: Internal Medicine

## 2020-12-17 ENCOUNTER — Encounter: Payer: Self-pay | Admitting: *Deleted

## 2020-12-17 ENCOUNTER — Other Ambulatory Visit (HOSPITAL_COMMUNITY)
Admission: RE | Admit: 2020-12-17 | Discharge: 2020-12-17 | Disposition: A | Discharge status: Home / Self Care | Payer: Medicare Other | Source: Ambulatory Visit | Attending: Internal Medicine | Admitting: Internal Medicine

## 2020-12-17 DIAGNOSIS — Z01818 Encounter for other preprocedural examination: Secondary | ICD-10-CM | POA: Insufficient documentation

## 2020-12-17 DIAGNOSIS — Z20822 Contact with and (suspected) exposure to covid-19: Secondary | ICD-10-CM | POA: Diagnosis not present

## 2020-12-17 NOTE — Telephone Encounter (Signed)
Scheduled per 12/28 los. Called and spoke with pt, confirmed 1/5 appt

## 2020-12-17 NOTE — Progress Notes (Signed)
FOR 12/19/20  TREATMENT, OK TO USE LABS FROM 12/11/20 PER DR MOHAMED.

## 2020-12-18 ENCOUNTER — Other Ambulatory Visit: Payer: Self-pay | Admitting: Primary Care

## 2020-12-18 DIAGNOSIS — E119 Type 2 diabetes mellitus without complications: Secondary | ICD-10-CM

## 2020-12-18 LAB — SARS CORONAVIRUS 2 (TAT 6-24 HRS): SARS Coronavirus 2: NEGATIVE

## 2020-12-19 ENCOUNTER — Inpatient Hospital Stay: Payer: Medicare Other | Attending: Internal Medicine

## 2020-12-19 ENCOUNTER — Other Ambulatory Visit: Payer: Self-pay

## 2020-12-19 VITALS — BP 152/96 | HR 99 | Temp 97.7°F | Resp 17

## 2020-12-19 DIAGNOSIS — C787 Secondary malignant neoplasm of liver and intrahepatic bile duct: Secondary | ICD-10-CM | POA: Diagnosis not present

## 2020-12-19 DIAGNOSIS — Z5189 Encounter for other specified aftercare: Secondary | ICD-10-CM | POA: Insufficient documentation

## 2020-12-19 DIAGNOSIS — C3411 Malignant neoplasm of upper lobe, right bronchus or lung: Secondary | ICD-10-CM | POA: Insufficient documentation

## 2020-12-19 DIAGNOSIS — Z79899 Other long term (current) drug therapy: Secondary | ICD-10-CM | POA: Insufficient documentation

## 2020-12-19 DIAGNOSIS — J90 Pleural effusion, not elsewhere classified: Secondary | ICD-10-CM | POA: Insufficient documentation

## 2020-12-19 DIAGNOSIS — Z5112 Encounter for antineoplastic immunotherapy: Secondary | ICD-10-CM | POA: Insufficient documentation

## 2020-12-19 DIAGNOSIS — D709 Neutropenia, unspecified: Secondary | ICD-10-CM | POA: Insufficient documentation

## 2020-12-19 DIAGNOSIS — Z5111 Encounter for antineoplastic chemotherapy: Secondary | ICD-10-CM | POA: Insufficient documentation

## 2020-12-19 MED ORDER — TRILACICLIB DIHYDROCHLORIDE INJECTION 300 MG
240.0000 mg/m2 | Freq: Once | INTRAVENOUS | Status: AC
  Administered 2020-12-19: 600 mg via INTRAVENOUS
  Filled 2020-12-19: qty 40

## 2020-12-19 MED ORDER — SODIUM CHLORIDE 0.9 % IV SOLN
100.0000 mg/m2 | Freq: Once | INTRAVENOUS | Status: AC
  Administered 2020-12-19: 250 mg via INTRAVENOUS
  Filled 2020-12-19: qty 12.5

## 2020-12-19 MED ORDER — HEPARIN SOD (PORK) LOCK FLUSH 100 UNIT/ML IV SOLN
500.0000 [IU] | Freq: Once | INTRAVENOUS | Status: AC | PRN
  Administered 2020-12-19: 500 [IU]
  Filled 2020-12-19: qty 5

## 2020-12-19 MED ORDER — PALONOSETRON HCL INJECTION 0.25 MG/5ML
0.2500 mg | Freq: Once | INTRAVENOUS | Status: AC
  Administered 2020-12-19: 0.25 mg via INTRAVENOUS

## 2020-12-19 MED ORDER — SODIUM CHLORIDE 0.9 % IV SOLN
624.5000 mg | Freq: Once | INTRAVENOUS | Status: AC
  Administered 2020-12-19: 620 mg via INTRAVENOUS
  Filled 2020-12-19: qty 62

## 2020-12-19 MED ORDER — SODIUM CHLORIDE 0.9% FLUSH
10.0000 mL | INTRAVENOUS | Status: DC | PRN
  Administered 2020-12-19: 10 mL
  Filled 2020-12-19: qty 10

## 2020-12-19 MED ORDER — SODIUM CHLORIDE 0.9 % IV SOLN
Freq: Once | INTRAVENOUS | Status: AC
  Filled 2020-12-19: qty 250

## 2020-12-19 MED ORDER — PALONOSETRON HCL INJECTION 0.25 MG/5ML
INTRAVENOUS | Status: AC
  Filled 2020-12-19: qty 5

## 2020-12-19 MED ORDER — SODIUM CHLORIDE 0.9 % IV SOLN
150.0000 mg | Freq: Once | INTRAVENOUS | Status: AC
  Administered 2020-12-19: 150 mg via INTRAVENOUS
  Filled 2020-12-19: qty 150

## 2020-12-19 MED ORDER — SODIUM CHLORIDE 0.9 % IV SOLN
1200.0000 mg | Freq: Once | INTRAVENOUS | Status: AC
  Administered 2020-12-19: 1200 mg via INTRAVENOUS
  Filled 2020-12-19: qty 20

## 2020-12-19 MED ORDER — SODIUM CHLORIDE 0.9 % IV SOLN
10.0000 mg | Freq: Once | INTRAVENOUS | Status: AC
  Administered 2020-12-19: 10 mg via INTRAVENOUS
  Filled 2020-12-19: qty 10

## 2020-12-19 NOTE — Patient Instructions (Signed)
[facility] Discharge Instructions for Patients Receiving Chemotherapy  Today you received the following chemotherapy agents: Tecentriq, Carboplatin, Etoposide  To help prevent nausea and vomiting after your treatment, we encourage you to take your nausea medication as directed.   If you develop nausea and vomiting that is not controlled by your nausea medication, call the clinic.   BELOW ARE SYMPTOMS THAT SHOULD BE REPORTED IMMEDIATELY:  *FEVER GREATER THAN 100.5 F  *CHILLS WITH OR WITHOUT FEVER  NAUSEA AND VOMITING THAT IS NOT CONTROLLED WITH YOUR NAUSEA MEDICATION  *UNUSUAL SHORTNESS OF BREATH  *UNUSUAL BRUISING OR BLEEDING  TENDERNESS IN MOUTH AND THROAT WITH OR WITHOUT PRESENCE OF ULCERS  *URINARY PROBLEMS  *BOWEL PROBLEMS  UNUSUAL RASH Items with * indicate a potential emergency and should be followed up as soon as possible.  Feel free to call the clinic should you have any questions or concerns. The clinic phone number is [phone].  Please show the [facility] at check-in to the Emergency Department and triage nurse.  Atezolizumab injection What is this medicine? ATEZOLIZUMAB (a te zoe LIZ ue mab) is a monoclonal antibody. It is used to treat bladder cancer (urothelial cancer), liver cancer, lung cancer, breast cancer, and melanoma. This medicine may be used for other purposes; ask your health care provider or pharmacist if you have questions. COMMON BRAND NAME(S): Tecentriq What should I tell my health care provider before I take this medicine? They need to know if you have any of these conditions:  diabetes  immune system problems  infection  inflammatory bowel disease  liver disease  lung or breathing disease  lupus  nervous system problems like myasthenia gravis or Guillain-Barre syndrome  organ transplant  an unusual or allergic reaction to atezolizumab, other medicines, foods, dyes, or preservatives  pregnant or  trying to get pregnant  breast-feeding How should I use this medicine? This medicine is for infusion into a vein. It is given by a health care professional in a hospital or clinic setting. A special MedGuide will be given to you before each treatment. Be sure to read this information carefully each time. Talk to your pediatrician regarding the use of this medicine in children. Special care may be needed. Overdosage: If you think you have taken too much of this medicine contact a poison control center or emergency room at once. NOTE: This medicine is only for you. Do not share this medicine with others. What if I miss a dose? It is important not to miss your dose. Call your doctor or health care professional if you are unable to keep an appointment. What may interact with this medicine? Interactions have not been studied. This list may not describe all possible interactions. Give your health care provider a list of all the medicines, herbs, non-prescription drugs, or dietary supplements you use. Also tell them if you smoke, drink alcohol, or use illegal drugs. Some items may interact with your medicine. What should I watch for while using this medicine? Your condition will be monitored carefully while you are receiving this medicine. You may need blood work done while you are taking this medicine. Do not become pregnant while taking this medicine or for at least 5 months after stopping it. Women should inform their doctor if they wish to become pregnant or think they might be pregnant. There is a potential for serious side effects to an unborn child. Talk to your health care professional or pharmacist for more information. Do not breast-feed an  infant while taking this medicine or for at least 5 months after the last dose. What side effects may I notice from receiving this medicine? Side effects that you should report to your doctor or health care professional as soon as possible:  allergic  reactions like skin rash, itching or hives, swelling of the face, lips, or tongue  black, tarry stools  bloody or watery diarrhea  breathing problems  changes in vision  chest pain or chest tightness  chills  facial flushing  fever  headache  signs and symptoms of high blood sugar such as dizziness; dry mouth; dry skin; fruity breath; nausea; stomach pain; increased hunger or thirst; increased urination  signs and symptoms of liver injury like dark yellow or brown urine; general ill feeling or flu-like symptoms; light-colored stools; loss of appetite; nausea; right upper belly pain; unusually weak or tired; yellowing of the eyes or skin  stomach pain  trouble passing urine or change in the amount of urine Side effects that usually do not require medical attention (report to your doctor or health care professional if they continue or are bothersome):  bone pain  cough  diarrhea  joint pain  muscle pain  muscle weakness  swelling of arms or legs  tiredness  weight loss This list may not describe all possible side effects. Call your doctor for medical advice about side effects. You may report side effects to FDA at 1-800-FDA-1088. Where should I keep my medicine? This drug is given in a hospital or clinic and will not be stored at home. NOTE: This sheet is a summary. It may not cover all possible information. If you have questions about this medicine, talk to your doctor, pharmacist, or health care provider.  2020 Elsevier/Gold Standard (2019-07-22 13:11:14)  Carboplatin injection What is this medicine? CARBOPLATIN (KAR boe pla tin) is a chemotherapy drug. It targets fast dividing cells, like cancer cells, and causes these cells to die. This medicine is used to treat ovarian cancer and many other cancers. This medicine may be used for other purposes; ask your health care provider or pharmacist if you have questions. COMMON BRAND NAME(S): Paraplatin What should I  tell my health care provider before I take this medicine? They need to know if you have any of these conditions:  blood disorders  hearing problems  kidney disease  recent or ongoing radiation therapy  an unusual or allergic reaction to carboplatin, cisplatin, other chemotherapy, other medicines, foods, dyes, or preservatives  pregnant or trying to get pregnant  breast-feeding How should I use this medicine? This drug is usually given as an infusion into a vein. It is administered in a hospital or clinic by a specially trained health care professional. Talk to your pediatrician regarding the use of this medicine in children. Special care may be needed. Overdosage: If you think you have taken too much of this medicine contact a poison control center or emergency room at once. NOTE: This medicine is only for you. Do not share this medicine with others. What if I miss a dose? It is important not to miss a dose. Call your doctor or health care professional if you are unable to keep an appointment. What may interact with this medicine?  medicines for seizures  medicines to increase blood counts like filgrastim, pegfilgrastim, sargramostim  some antibiotics like amikacin, gentamicin, neomycin, streptomycin, tobramycin  vaccines Talk to your doctor or health care professional before taking any of these medicines:  acetaminophen  aspirin  ibuprofen  ketoprofen  naproxen This list may not describe all possible interactions. Give your health care provider a list of all the medicines, herbs, non-prescription drugs, or dietary supplements you use. Also tell them if you smoke, drink alcohol, or use illegal drugs. Some items may interact with your medicine. What should I watch for while using this medicine? Your condition will be monitored carefully while you are receiving this medicine. You will need important blood work done while you are taking this medicine. This drug may make you  feel generally unwell. This is not uncommon, as chemotherapy can affect healthy cells as well as cancer cells. Report any side effects. Continue your course of treatment even though you feel ill unless your doctor tells you to stop. In some cases, you may be given additional medicines to help with side effects. Follow all directions for their use. Call your doctor or health care professional for advice if you get a fever, chills or sore throat, or other symptoms of a cold or flu. Do not treat yourself. This drug decreases your body's ability to fight infections. Try to avoid being around people who are sick. This medicine may increase your risk to bruise or bleed. Call your doctor or health care professional if you notice any unusual bleeding. Be careful brushing and flossing your teeth or using a toothpick because you may get an infection or bleed more easily. If you have any dental work done, tell your dentist you are receiving this medicine. Avoid taking products that contain aspirin, acetaminophen, ibuprofen, naproxen, or ketoprofen unless instructed by your doctor. These medicines may hide a fever. Do not become pregnant while taking this medicine. Women should inform their doctor if they wish to become pregnant or think they might be pregnant. There is a potential for serious side effects to an unborn child. Talk to your health care professional or pharmacist for more information. Do not breast-feed an infant while taking this medicine. What side effects may I notice from receiving this medicine? Side effects that you should report to your doctor or health care professional as soon as possible:  allergic reactions like skin rash, itching or hives, swelling of the face, lips, or tongue  signs of infection - fever or chills, cough, sore throat, pain or difficulty passing urine  signs of decreased platelets or bleeding - bruising, pinpoint red spots on the skin, black, tarry stools,  nosebleeds  signs of decreased red blood cells - unusually weak or tired, fainting spells, lightheadedness  breathing problems  changes in hearing  changes in vision  chest pain  high blood pressure  low blood counts - This drug may decrease the number of white blood cells, red blood cells and platelets. You may be at increased risk for infections and bleeding.  nausea and vomiting  pain, swelling, redness or irritation at the injection site  pain, tingling, numbness in the hands or feet  problems with balance, talking, walking  trouble passing urine or change in the amount of urine Side effects that usually do not require medical attention (report to your doctor or health care professional if they continue or are bothersome):  hair loss  loss of appetite  metallic taste in the mouth or changes in taste This list may not describe all possible side effects. Call your doctor for medical advice about side effects. You may report side effects to FDA at 1-800-FDA-1088. Where should I keep my medicine? This drug is given in a hospital or clinic and will  not be stored at home. NOTE: This sheet is a summary. It may not cover all possible information. If you have questions about this medicine, talk to your doctor, pharmacist, or health care provider.  2020 Elsevier/Gold Standard (2008-03-07 14:38:05)  Etoposide, VP-16 capsules What is this medicine? ETOPOSIDE, VP-16 (e toe POE side) is a chemotherapy drug. It is used to treat small cell lung cancer and other cancers. This medicine may be used for other purposes; ask your health care provider or pharmacist if you have questions. COMMON BRAND NAME(S): VePesid What should I tell my health care provider before I take this medicine? They need to know if you have any of these conditions:  infection  kidney disease  liver disease  low blood counts, like low white cell, platelet, or red cell counts  an unusual or allergic  reaction to etoposide, other medicines, foods, dyes, or preservatives  pregnant or trying to get pregnant  breast-feeding How should I use this medicine? Take this medicine by mouth with a glass of water. Follow the directions on the prescription label. Do not open, crush, or chew the capsules. It is advisable to wear gloves when handling this medicine. Take your medicine at regular intervals. Do not take it more often than directed. Do not stop taking except on your doctor's advice. Talk to your pediatrician regarding the use of this medicine in children. Special care may be needed. Overdosage: If you think you have taken too much of this medicine contact a poison control center or emergency room at once. NOTE: This medicine is only for you. Do not share this medicine with others. What if I miss a dose? If you miss a dose, take it as soon as you can. If it is almost time for your next dose, take only that dose. Do not take double or extra doses. What may interact with this medicine? This medicine may interact with the following medications:  cyclosporine  warfarin This list may not describe all possible interactions. Give your health care provider a list of all the medicines, herbs, non-prescription drugs, or dietary supplements you use. Also tell them if you smoke, drink alcohol, or use illegal drugs. Some items may interact with your medicine. What should I watch for while using this medicine? Visit your doctor for checks on your progress. This drug may make you feel generally unwell. This is not uncommon, as chemotherapy can affect healthy cells as well as cancer cells. Report any side effects. Continue your course of treatment even though you feel ill unless your doctor tells you to stop. In some cases, you may be given additional medicines to help with side effects. Follow all directions for their use. Call your doctor or health care professional for advice if you get a fever, chills or  sore throat, or other symptoms of a cold or flu. Do not treat yourself. This drug decreases your body's ability to fight infections. Try to avoid being around people who are sick. This medicine may increase your risk to bruise or bleed. Call your doctor or health care professional if you notice any unusual bleeding. Talk to your doctor about your risk of cancer. You may be more at risk for certain types of cancers if you take this medicine. Do not become pregnant while taking this medicine or for at least 6 months after stopping it. Women should inform their doctor if they wish to become pregnant or think they might be pregnant. Women of child-bearing potential will need to  have a negative pregnancy test before starting this medicine. There is a potential for serious side effects to an unborn child. Talk to your health care professional or pharmacist for more information. Do not breast-feed an infant while taking this medicine. Men must use a latex condom during sexual contact with a woman while taking this medicine and for at least 4 months after stopping it. A latex condom is needed even if you have had a vasectomy. Contact your doctor right away if your partner becomes pregnant. Do not donate sperm while taking this medicine and for 4 months after you stop taking this medicine. Men should inform their doctors if they wish to father a child. This medicine may lower sperm counts. What side effects may I notice from receiving this medicine? Side effects that you should report to your doctor or health care professional as soon as possible:  allergic reactions like skin rash, itching or hives, swelling of the face, lips, or tongue  low blood counts - this medicine may decrease the number of white blood cells, red blood cells, and platelets. You may be at increased risk for infections and bleeding  nausea, vomiting  redness, blistering, peeling or loosening of the skin, including inside the mouth  signs  and symptoms of infection like fever; chills; cough; sore throat; pain or trouble passing urine  signs and symptoms of low red blood cells or anemia such as unusually weak or tired; feeling faint or lightheaded; falls; breathing problems  unusual bruising or bleeding Side effects that usually do not require medical attention (report to your doctor or health care professional if they continue or are bothersome):  changes in taste  diarrhea  hair loss  loss of appetite  mouth sores This list may not describe all possible side effects. Call your doctor for medical advice about side effects. You may report side effects to FDA at 1-800-FDA-1088. Where should I keep my medicine? Keep out of the reach of children. Store in a refrigerator between 2 and 8 degrees C (36 and 46 degrees F). Do not freeze. Throw away any unused medicine after the expiration date. NOTE: This sheet is a summary. It may not cover all possible information. If you have questions about this medicine, talk to your doctor, pharmacist, or health care provider.  2020 Elsevier/Gold Standard (2019-01-26 16:44:55)

## 2020-12-20 ENCOUNTER — Ambulatory Visit (HOSPITAL_COMMUNITY)
Admission: RE | Admit: 2020-12-20 | Discharge: 2020-12-20 | Disposition: A | Discharge status: Home / Self Care | Payer: Medicare Other | Source: Ambulatory Visit | Attending: Radiology | Admitting: Radiology

## 2020-12-20 ENCOUNTER — Other Ambulatory Visit: Payer: Self-pay

## 2020-12-20 ENCOUNTER — Ambulatory Visit (HOSPITAL_COMMUNITY)
Admission: RE | Admit: 2020-12-20 | Discharge: 2020-12-20 | Disposition: A | Discharge status: Home / Self Care | Payer: Medicare Other | Source: Ambulatory Visit | Attending: Internal Medicine | Admitting: Internal Medicine

## 2020-12-20 ENCOUNTER — Inpatient Hospital Stay: Payer: Medicare Other

## 2020-12-20 VITALS — BP 130/77 | HR 96 | Temp 98.3°F | Resp 20

## 2020-12-20 DIAGNOSIS — D709 Neutropenia, unspecified: Secondary | ICD-10-CM | POA: Diagnosis not present

## 2020-12-20 DIAGNOSIS — Z5112 Encounter for antineoplastic immunotherapy: Secondary | ICD-10-CM | POA: Diagnosis not present

## 2020-12-20 DIAGNOSIS — Z9889 Other specified postprocedural states: Secondary | ICD-10-CM

## 2020-12-20 DIAGNOSIS — Z5111 Encounter for antineoplastic chemotherapy: Secondary | ICD-10-CM | POA: Diagnosis not present

## 2020-12-20 DIAGNOSIS — C3411 Malignant neoplasm of upper lobe, right bronchus or lung: Secondary | ICD-10-CM

## 2020-12-20 DIAGNOSIS — J90 Pleural effusion, not elsewhere classified: Secondary | ICD-10-CM | POA: Diagnosis not present

## 2020-12-20 DIAGNOSIS — C787 Secondary malignant neoplasm of liver and intrahepatic bile duct: Secondary | ICD-10-CM | POA: Diagnosis not present

## 2020-12-20 DIAGNOSIS — Z85118 Personal history of other malignant neoplasm of bronchus and lung: Secondary | ICD-10-CM | POA: Diagnosis not present

## 2020-12-20 MED ORDER — SODIUM CHLORIDE 0.9 % IV SOLN
100.0000 mg/m2 | Freq: Once | INTRAVENOUS | Status: AC
  Administered 2020-12-20: 250 mg via INTRAVENOUS
  Filled 2020-12-20: qty 12.5

## 2020-12-20 MED ORDER — SODIUM CHLORIDE 0.9 % IV SOLN
10.0000 mg | Freq: Once | INTRAVENOUS | Status: AC
  Administered 2020-12-20: 10 mg via INTRAVENOUS
  Filled 2020-12-20: qty 10

## 2020-12-20 MED ORDER — LIDOCAINE HCL 1 % IJ SOLN
INTRAMUSCULAR | Status: AC
  Filled 2020-12-20: qty 20

## 2020-12-20 MED ORDER — TRILACICLIB DIHYDROCHLORIDE INJECTION 300 MG
240.0000 mg/m2 | Freq: Once | INTRAVENOUS | Status: AC
  Administered 2020-12-20: 600 mg via INTRAVENOUS
  Filled 2020-12-20: qty 40

## 2020-12-20 MED ORDER — SODIUM CHLORIDE 0.9 % IV SOLN
Freq: Once | INTRAVENOUS | Status: AC
  Filled 2020-12-20: qty 250

## 2020-12-20 MED ORDER — HEPARIN SOD (PORK) LOCK FLUSH 100 UNIT/ML IV SOLN
500.0000 [IU] | Freq: Once | INTRAVENOUS | Status: AC | PRN
  Administered 2020-12-20: 500 [IU]
  Filled 2020-12-20: qty 5

## 2020-12-20 MED ORDER — SODIUM CHLORIDE 0.9% FLUSH
10.0000 mL | INTRAVENOUS | Status: DC | PRN
Start: 2020-12-20 — End: 2020-12-20
  Administered 2020-12-20: 10 mL
  Filled 2020-12-20: qty 10

## 2020-12-20 NOTE — Progress Notes (Signed)
OK to use labs from 12/11/20 for today's tx per Dr. Julien Nordmann.

## 2020-12-20 NOTE — Patient Instructions (Signed)
Etoposide, VP-16 injection What is this medicine? ETOPOSIDE, VP-16 (e toe POE side) is a chemotherapy drug. It is used to treat testicular cancer, lung cancer, and other cancers. This medicine may be used for other purposes; ask your health care provider or pharmacist if you have questions. COMMON BRAND NAME(S): Etopophos, Toposar, VePesid What should I tell my health care provider before I take this medicine? They need to know if you have any of these conditions:  infection  kidney disease  liver disease  low blood counts, like low white cell, platelet, or red cell counts  an unusual or allergic reaction to etoposide, other medicines, foods, dyes, or preservatives  pregnant or trying to get pregnant  breast-feeding How should I use this medicine? This medicine is for infusion into a vein. It is administered in a hospital or clinic by a specially trained health care professional. Talk to your pediatrician regarding the use of this medicine in children. Special care may be needed. Overdosage: If you think you have taken too much of this medicine contact a poison control center or emergency room at once. NOTE: This medicine is only for you. Do not share this medicine with others. What if I miss a dose? It is important not to miss your dose. Call your doctor or health care professional if you are unable to keep an appointment. What may interact with this medicine? This medicine may interact with the following medications:  warfarin This list may not describe all possible interactions. Give your health care provider a list of all the medicines, herbs, non-prescription drugs, or dietary supplements you use. Also tell them if you smoke, drink alcohol, or use illegal drugs. Some items may interact with your medicine. What should I watch for while using this medicine? Visit your doctor for checks on your progress. This drug may make you feel generally unwell. This is not uncommon, as  chemotherapy can affect healthy cells as well as cancer cells. Report any side effects. Continue your course of treatment even though you feel ill unless your doctor tells you to stop. In some cases, you may be given additional medicines to help with side effects. Follow all directions for their use. Call your doctor or health care professional for advice if you get a fever, chills or sore throat, or other symptoms of a cold or flu. Do not treat yourself. This drug decreases your body's ability to fight infections. Try to avoid being around people who are sick. This medicine may increase your risk to bruise or bleed. Call your doctor or health care professional if you notice any unusual bleeding. Talk to your doctor about your risk of cancer. You may be more at risk for certain types of cancers if you take this medicine. Do not become pregnant while taking this medicine or for at least 6 months after stopping it. Women should inform their doctor if they wish to become pregnant or think they might be pregnant. Women of child-bearing potential will need to have a negative pregnancy test before starting this medicine. There is a potential for serious side effects to an unborn child. Talk to your health care professional or pharmacist for more information. Do not breast-feed an infant while taking this medicine. Men must use a latex condom during sexual contact with a woman while taking this medicine and for at least 4 months after stopping it. A latex condom is needed even if you have had a vasectomy. Contact your doctor right away if your partner  becomes pregnant. Do not donate sperm while taking this medicine and for at least 4 months after you stop taking this medicine. Men should inform their doctors if they wish to father a child. This medicine may lower sperm counts. What side effects may I notice from receiving this medicine? Side effects that you should report to your doctor or health care professional  as soon as possible:  allergic reactions like skin rash, itching or hives, swelling of the face, lips, or tongue  low blood counts - this medicine may decrease the number of white blood cells, red blood cells, and platelets. You may be at increased risk for infections and bleeding  nausea, vomiting  redness, blistering, peeling or loosening of the skin, including inside the mouth  signs and symptoms of infection like fever; chills; cough; sore throat; pain or trouble passing urine  signs and symptoms of low red blood cells or anemia such as unusually weak or tired; feeling faint or lightheaded; falls; breathing problems  unusual bruising or bleeding Side effects that usually do not require medical attention (report to your doctor or health care professional if they continue or are bothersome):  changes in taste  diarrhea  hair loss  loss of appetite  mouth sores This list may not describe all possible side effects. Call your doctor for medical advice about side effects. You may report side effects to FDA at 1-800-FDA-1088. Where should I keep my medicine? This drug is given in a hospital or clinic and will not be stored at home. NOTE: This sheet is a summary. It may not cover all possible information. If you have questions about this medicine, talk to your doctor, pharmacist, or health care provider.  2020 [employer] (2019-01-26 16:57:15)

## 2020-12-20 NOTE — Procedures (Signed)
Ultrasound-guided therapeutic right thoracentesis performed yielding 750 cc of amber fluid. No immediate complications. Follow-up chest x-ray pending. EBL< 1 cc.

## 2020-12-21 ENCOUNTER — Other Ambulatory Visit: Payer: Self-pay

## 2020-12-21 ENCOUNTER — Inpatient Hospital Stay: Payer: Medicare Other

## 2020-12-21 VITALS — BP 140/81 | HR 96 | Temp 97.8°F | Resp 18

## 2020-12-21 DIAGNOSIS — Z5112 Encounter for antineoplastic immunotherapy: Secondary | ICD-10-CM | POA: Diagnosis not present

## 2020-12-21 DIAGNOSIS — D709 Neutropenia, unspecified: Secondary | ICD-10-CM | POA: Diagnosis not present

## 2020-12-21 DIAGNOSIS — C3411 Malignant neoplasm of upper lobe, right bronchus or lung: Secondary | ICD-10-CM

## 2020-12-21 DIAGNOSIS — Z5111 Encounter for antineoplastic chemotherapy: Secondary | ICD-10-CM | POA: Diagnosis not present

## 2020-12-21 DIAGNOSIS — C787 Secondary malignant neoplasm of liver and intrahepatic bile duct: Secondary | ICD-10-CM | POA: Diagnosis not present

## 2020-12-21 DIAGNOSIS — J90 Pleural effusion, not elsewhere classified: Secondary | ICD-10-CM | POA: Diagnosis not present

## 2020-12-21 MED ORDER — SODIUM CHLORIDE 0.9 % IV SOLN
Freq: Once | INTRAVENOUS | Status: AC
  Filled 2020-12-21: qty 250

## 2020-12-21 MED ORDER — HEPARIN SOD (PORK) LOCK FLUSH 100 UNIT/ML IV SOLN
500.0000 [IU] | Freq: Once | INTRAVENOUS | Status: AC | PRN
  Administered 2020-12-21: 500 [IU]
  Filled 2020-12-21: qty 5

## 2020-12-21 MED ORDER — ETOPOSIDE CHEMO INJECTION 1 GM/50ML
100.0000 mg/m2 | Freq: Once | INTRAVENOUS | Status: AC
  Administered 2020-12-21: 250 mg via INTRAVENOUS
  Filled 2020-12-21: qty 12.5

## 2020-12-21 MED ORDER — SODIUM CHLORIDE 0.9% FLUSH
10.0000 mL | INTRAVENOUS | Status: DC | PRN
  Administered 2020-12-21: 10 mL
  Filled 2020-12-21: qty 10

## 2020-12-21 MED ORDER — SODIUM CHLORIDE 0.9 % IV SOLN
10.0000 mg | Freq: Once | INTRAVENOUS | Status: AC
  Administered 2020-12-21: 10 mg via INTRAVENOUS
  Filled 2020-12-21: qty 10

## 2020-12-21 MED ORDER — TRILACICLIB DIHYDROCHLORIDE INJECTION 300 MG
240.0000 mg/m2 | Freq: Once | INTRAVENOUS | Status: AC
  Administered 2020-12-21: 600 mg via INTRAVENOUS
  Filled 2020-12-21: qty 40

## 2020-12-21 NOTE — Patient Instructions (Signed)
[facility] Discharge Instructions for Patients Receiving Chemotherapy  Today you received the following chemotherapy agent: Etoposide  To help prevent nausea and vomiting after your treatment, we encourage you to take your nausea medication as directed.   If you develop nausea and vomiting that is not controlled by your nausea medication, call the clinic.   BELOW ARE SYMPTOMS THAT SHOULD BE REPORTED IMMEDIATELY:  *FEVER GREATER THAN 100.5 F  *CHILLS WITH OR WITHOUT FEVER  NAUSEA AND VOMITING THAT IS NOT CONTROLLED WITH YOUR NAUSEA MEDICATION  *UNUSUAL SHORTNESS OF BREATH  *UNUSUAL BRUISING OR BLEEDING  TENDERNESS IN MOUTH AND THROAT WITH OR WITHOUT PRESENCE OF ULCERS  *URINARY PROBLEMS  *BOWEL PROBLEMS  UNUSUAL RASH Items with * indicate a potential emergency and should be followed up as soon as possible.  Feel free to call the clinic should you have any questions or concerns. The clinic phone number is [phone].  Please show the [facility] at check-in to the Emergency Department and triage nurse.

## 2020-12-24 DIAGNOSIS — R7989 Other specified abnormal findings of blood chemistry: Secondary | ICD-10-CM | POA: Diagnosis not present

## 2020-12-24 DIAGNOSIS — E236 Other disorders of pituitary gland: Secondary | ICD-10-CM | POA: Diagnosis not present

## 2020-12-24 DIAGNOSIS — E291 Testicular hypofunction: Secondary | ICD-10-CM | POA: Diagnosis not present

## 2020-12-24 DIAGNOSIS — R972 Elevated prostate specific antigen [PSA]: Secondary | ICD-10-CM | POA: Diagnosis not present

## 2020-12-25 ENCOUNTER — Inpatient Hospital Stay (HOSPITAL_BASED_OUTPATIENT_CLINIC_OR_DEPARTMENT_OTHER): Payer: Medicare Other | Admitting: Internal Medicine

## 2020-12-25 ENCOUNTER — Other Ambulatory Visit: Payer: Self-pay

## 2020-12-25 ENCOUNTER — Encounter: Payer: Self-pay | Admitting: Internal Medicine

## 2020-12-25 VITALS — BP 147/85 | HR 96 | Temp 97.7°F | Resp 17 | Ht 69.0 in | Wt 273.4 lb

## 2020-12-25 DIAGNOSIS — C3411 Malignant neoplasm of upper lobe, right bronchus or lung: Secondary | ICD-10-CM

## 2020-12-25 DIAGNOSIS — Z5112 Encounter for antineoplastic immunotherapy: Secondary | ICD-10-CM

## 2020-12-25 DIAGNOSIS — R5382 Chronic fatigue, unspecified: Secondary | ICD-10-CM

## 2020-12-25 DIAGNOSIS — D709 Neutropenia, unspecified: Secondary | ICD-10-CM | POA: Diagnosis not present

## 2020-12-25 DIAGNOSIS — Z5111 Encounter for antineoplastic chemotherapy: Secondary | ICD-10-CM | POA: Diagnosis not present

## 2020-12-25 DIAGNOSIS — J90 Pleural effusion, not elsewhere classified: Secondary | ICD-10-CM | POA: Diagnosis not present

## 2020-12-25 DIAGNOSIS — C787 Secondary malignant neoplasm of liver and intrahepatic bile duct: Secondary | ICD-10-CM | POA: Diagnosis not present

## 2020-12-25 DIAGNOSIS — I10 Essential (primary) hypertension: Secondary | ICD-10-CM | POA: Diagnosis not present

## 2020-12-25 NOTE — Progress Notes (Signed)
[facility] [phone]   [phone]  OFFICE PROGRESS NOTE  [name], NP [place] [address]  DIAGNOSIS: Relapsed extensive stage (T4, N3, M1c)  small cell lung cancer diagnosed in April 2021 and presented with large right upper lobe/right hilar mass with ipsilateral and contralateral mediastinal and right supraclavicular lymphadenopathy in addition to multiple liver lesios. He has disease progression after the first line of chemotherapy in December 2021.  PRIOR THERAPY:  1) Palliative radiotherapy to the right upper lobe lung mass under the care of Dr. Lisbeth Renshaw. 2) Systemic chemotherapy with carboplatin for AUC of 5 on day 1, etoposide 100 mg/M2 on days 1, 2 and 3 in addition to Imfinzi 1500 mg IV every 3 weeks with chemotherapy then every 4 weeks for maintenance if the patient has no evidence for progression.  He will also receive Cosela 240 mg/m2 on the days of the chemotherapy.  Status post 9 cycles.  Starting from cycle #5 the patient will be on maintenance treatment with immunotherapy with Imfinzi 1500 mg IV every 4 weeks. Last dose of chemotherapy was given on November 13, 2020. This treatment was discontinued secondary to disease progression.  CURRENT THERAPY: Systemic chemotherapy with carboplatin for AUC of 5 on day 1, etoposide 100 mg/M2 on days 1, 2 and 3, Tecentriq 1200 mg IV every 3 weeks as well as Cosela 250 mg/M2 on the days of the chemotherapy every 3 weeks.  First dose December 18, 2020.  INTERVAL HISTORY: Adam Wise 70 y.o. male returns to the clinic today for follow-up visit accompanied by his wife.  The patient is feeling fine today with no concerning complaints.  He tolerated the first cycle of his treatment with carboplatin, etoposide, Tecentriq and Cosela fairly well.  He denied having any current chest pain, shortness of breath, cough or hemoptysis.  He underwent ultrasound-guided right thoracentesis and he  felt much better after the procedure.  He has no nausea, vomiting, diarrhea or constipation.  He denied having any headache or visual changes.  He is here today for evaluation and repeat blood work.   MEDICAL HISTORY: Past Medical History:  Diagnosis Date  . Cancer ([facility])   . Chickenpox   . Chronic knee pain   . Chronic low back pain   . Essential hypertension   . GERD (gastroesophageal reflux disease)   . OSA (obstructive sleep apnea)   . Testosterone deficiency   . Type 2 diabetes mellitus ([facility])     ALLERGIES:  is allergic to bupropion.  MEDICATIONS:  Current Outpatient Medications  Medication Sig Dispense Refill  . albuterol (VENTOLIN HFA) 108 (90 Base) MCG/ACT inhaler Inhale 2 puffs into the lungs every 6 (six) hours as needed for wheezing or shortness of breath. 18 g 0  . amLODipine (NORVASC) 10 MG tablet TAKE 1 TABLET BY MOUTH EVERY DAY 90 tablet 1  . Black Pepper-Turmeric (TURMERIC COMPLEX/BLACK PEPPER PO) Take 1 tablet by mouth in the morning and at bedtime.    . blood glucose meter kit and supplies KIT Dispense based on patient and insurance preference. Use up to four times daily as directed. 1 each 0  . Coenzyme Q10 ([employer] COQ-10) 200 MG capsule Take 200 mg by mouth every evening.     Marland Kitchen ELIQUIS 5 MG TABS tablet TAKE 1 TABLET BY MOUTH TWICE A DAY 180 tablet 2  . esomeprazole (NEXIUM) 20 MG capsule Take 20 mg by mouth daily.     Marland Kitchen  fluticasone (FLONASE) 50 MCG/ACT nasal spray Place 2 sprays into both nostrils daily as needed for allergies.    Marland Kitchen gabapentin (NEURONTIN) 300 MG capsule Take 300 mg by mouth 2 (two) times daily.     Marland Kitchen glipiZIDE (GLUCOTROL) 10 MG tablet TAKE 1 TABLET (10 MG TOTAL) BY MOUTH 2 (TWO) TIMES DAILY BEFORE A MEAL. FOR DIABETES. 180 tablet 2  . Insulin Glargine (BASAGLAR KWIKPEN) 100 UNIT/ML Inject 20 Units into the skin at bedtime. For diabetes. 15 mL 3  . Insulin Pen Needle (BD PEN NEEDLE NANO U/F) 32G X 4 MM MISC Use with insulin as prescribed  Dx Code:  E11.9 100 each 2  . KRILL OIL PO Take 350 mg by mouth daily.    . Lancets MISC USE UP TO 3 TIMES DAILY AS DIRECTED 100 each 2  . lidocaine-prilocaine (EMLA) cream Apply 1 application topically as needed. 30 g 2  . lisinopril (ZESTRIL) 5 MG tablet TAKE 1 TABLET BY MOUTH EVERY DAY 90 tablet 1  . Melatonin 10 MG CAPS Take 10 mg by mouth at bedtime.    . metFORMIN (GLUCOPHAGE) 1000 MG tablet TAKE 1 TABLET (1,000 MG TOTAL) BY MOUTH 2 (TWO) TIMES DAILY WITH A MEAL. 180 tablet 0  . Multiple Vitamin (MULTI-VITAMINS) TABS Take 1 tablet by mouth daily.     Glory Rosebush ULTRA test strip USE UP TO 4 TIMES DAILY AS DIRECTED 100 strip 5  . OVER THE COUNTER MEDICATION Take by mouth.    Marland Kitchen oxymetazoline (AFRIN) 0.05 % nasal spray Place 1 spray into both nostrils 2 (two) times daily as needed for congestion.    . pioglitazone (ACTOS) 45 MG tablet TAKE 1 TABLET BY MOUTH EVERY DAY 90 tablet 1  . pravastatin (PRAVACHOL) 40 MG tablet TAKE 1 TABLET BY MOUTH EVERY DAY IN THE EVENING FOR CHOLESTEROL 90 tablet 1  . SYRINGE-NEEDLE, DISP, 3 ML (B-D 3CC LUER-LOK SYR 22GX1") 22G X 1" 3 ML MISC USE AS INSTRUCTED FOR TESTOSTERONE INJECTION EVERY 2 WEEKS 10 each 2  . acetaminophen (TYLENOL) 500 MG tablet Take 1,500 mg by mouth every 8 (eight) hours as needed for moderate pain or headache. (Patient not taking: No sig reported)    . chlorpheniramine-HYDROcodone (TUSSIONEX) 10-8 MG/5ML SUER Take 5 mLs by mouth at bedtime as needed for cough. (Patient not taking: Reported on 12/25/2020) 140 mL 0  . insulin aspart (NOVOLOG FLEXPEN) 100 UNIT/ML FlexPen Inject three times daily per sliding scale. (Patient not taking: No sig reported) 15 mL 0  . prochlorperazine (COMPAZINE) 10 MG tablet Take 1 tablet (10 mg total) by mouth every 6 (six) hours as needed for nausea or vomiting. (Patient not taking: No sig reported) 30 tablet 0  . sildenafil (REVATIO) 20 MG tablet Take 3-5 tablets by mouth 1 hour prior to intercourse as needed. 50 tablet 0  .  testosterone cypionate (DEPOTESTOSTERONE CYPIONATE) 200 MG/ML injection Inject 200 mg into the muscle every 14 (fourteen) days.     No current facility-administered medications for this visit.    SURGICAL HISTORY:  Past Surgical History:  Procedure Laterality Date  . COLONOSCOPY WITH PROPOFOL N/A 12/17/2018   Procedure: COLONOSCOPY WITH PROPOFOL;  Surgeon: Virgel Manifold, MD;  Location: [facility] ENDOSCOPY;  Service: Gastroenterology;  Laterality: N/A;  . IR IMAGING GUIDED PORT INSERTION  04/17/2020  . JOINT REPLACEMENT Bilateral   . REPLACEMENT TOTAL KNEE BILATERAL  2015  . TONSILLECTOMY  1960    REVIEW OF SYSTEMS:  A comprehensive review of systems  was negative except for: Constitutional: positive for fatigue   PHYSICAL EXAMINATION: General appearance: alert, cooperative, fatigued and no distress Head: atraumatic Neck: no adenopathy, no JVD, supple, symmetrical, trachea midline and thyroid not enlarged, symmetric, no tenderness/mass/nodules Lymph nodes: Cervical, supraclavicular, and axillary nodes normal. Resp: clear to auscultation bilaterally Back: symmetric, no curvature. ROM normal. No CVA tenderness. Cardio: regular rate and rhythm, S1, S2 normal, no murmur, click, rub or gallop GI: soft, non-tender; bowel sounds normal; no masses,  no organomegaly Extremities: extremities normal, atraumatic, no cyanosis or edema  ECOG PERFORMANCE STATUS: 1 - Symptomatic but completely ambulatory  Blood pressure (!) 147/85, pulse 96, temperature 97.7 F (36.5 C), temperature source Tympanic, resp. rate 17, height $RemoveBe'5\' 9"'UsGDjsaFL$  (1.753 m), weight 273 lb 6.4 oz (124 kg), SpO2 100 %.  LABORATORY DATA: Lab Results  Component Value Date   WBC 4.9 12/11/2020   HGB 13.8 12/11/2020   HCT 42.5 12/11/2020   MCV 88.5 12/11/2020   PLT 170 12/11/2020      Chemistry      Component Value Date/Time   NA 137 12/11/2020 1110   K 4.5 12/11/2020 1110   CL 104 12/11/2020 1110   CO2 28 12/11/2020 1110   BUN  17 12/11/2020 1110   CREATININE 1.22 12/11/2020 1110      Component Value Date/Time   CALCIUM 9.2 12/11/2020 1110   ALKPHOS 68 12/11/2020 1110   AST 19 12/11/2020 1110   ALT 19 12/11/2020 1110   BILITOT 0.3 12/11/2020 1110       RADIOGRAPHIC STUDIES: DG Chest 1 View  Result Date: 12/20/2020 CLINICAL DATA:  Recent thoracentesis.  History of lung carcinoma EXAM: CHEST  1 VIEW COMPARISON:  Chest CT December 10, 2020; chest radiograph September 14, 2020 FINDINGS: No pneumothorax. No appreciable residual pleural effusion. Port-A-Cath tip is in superior vena cava. There is scarring with retraction in the right upper lobe. There is volume loss in the right lower lobe more medially. The left lung is clear. Heart size is upper normal, stable. Pulmonary vascularity on the left is normal. Distortion of pulmonary vascularity on the right due to retraction/scarring noted. No adenopathy evident by radiography. No bone lesions. IMPRESSION: No pneumothorax. No appreciable residual pleural effusion. Scarring with retraction in the right upper lobe with a lesser degree of retraction in the medial right base. Distortion of pulmonary vascularity on the right due to retraction/scarring. Left lung clear. Stable cardiac silhouette. Port-A-Cath tip in superior vena cava. Electronically Signed   By: Lowella Grip III M.D.   On: 12/20/2020 11:31   CT Chest W Contrast  Result Date: 12/10/2020 CLINICAL DATA:  Small cell lung cancer, for restaging EXAM: CT CHEST, ABDOMEN, AND PELVIS WITH CONTRAST TECHNIQUE: Multidetector CT imaging of the chest, abdomen and pelvis was performed following the standard protocol during bolus administration of intravenous contrast. CONTRAST:  135mL OMNIPAQUE IOHEXOL 300 MG/ML  SOLN COMPARISON:  CT chest dated 10/10/2020. CT chest abdomen pelvis dated 09/06/2020. FINDINGS: CT CHEST FINDINGS Cardiovascular: The heart is normal in size. No pericardial effusion. No evidence of thoracic aortic  aneurysm. Mild atherosclerotic calcifications of the aortic arch. Coronary atherosclerosis the LAD and left circumflex. Right chest port terminates the cavoatrial junction. Mediastinum/Nodes: 10 mm subcarinal node (series 2/image 27). Low-density soft tissue in the low right paratracheal region (series 2/image 24) favors fluid. Additional bilobed fluid in the right epicardial region (series 2/image 36), unchanged. Visualized thyroid is unremarkable. Lungs/Pleura: Radiation changes in the medial right upper lobe and central  right lower lobe/perihilar region. Volume loss in the right hemithorax. Moderate right pleural effusion, increased. Left lung is clear.  No suspicious pulmonary nodules. No pneumothorax. Musculoskeletal: Degenerative changes of the thoracic spine. CT ABDOMEN PELVIS FINDINGS Hepatobiliary: Progressive multifocal hepatic metastases in both lobes, including: --3.1 cm lesion in segment 4A (series 2/image 50) --3.2 cm lesion in segment 3 (series 2/image 56) --3.3 cm lesion in the caudate (series 2/image 56) --2.8 cm lesion in segment 5 (series 2/image 57) Hepatic metastases were difficult to visualize on recent CT chest and measured less than 10 mm on prior CT abdomen/pelvis. Gallbladder is unremarkable. No intrahepatic or extrahepatic ductal dilatation. Pancreas: Within normal limits. Spleen: Within normal limits. Adrenals/Urinary Tract: Adrenal glands are within normal limits. Right renal cysts measuring up to 6.7 cm (series 2/image 34). Left kidney is within normal limits. No hydronephrosis. Bladder is within normal limits. Stomach/Bowel: Stomach is within normal limits. Moderate duodenal diverticulum. No evidence of bowel obstruction. Normal appendix (series 2/image 88). No colonic wall thickening or mass is evident on CT. Vascular/Lymphatic: No evidence of abdominal aortic aneurysm. Atherosclerotic calcifications of the abdominal aorta and branch vessels. No suspicious abdominopelvic  lymphadenopathy. Reproductive: Mild prostatomegaly. Other: No abdominopelvic ascites. Musculoskeletal: Degenerative changes of the lumbar spine. IMPRESSION: Radiation changes in the right hemithorax. Moderate right pleural effusion, increased. Progressive multifocal hepatic metastases in both lobes, with index lesions as above. Additional ancillary findings as above. Electronically Signed   By: Julian Hy M.D.   On: 12/10/2020 14:28   CT Abdomen Pelvis W Contrast  Result Date: 12/10/2020 CLINICAL DATA:  Small cell lung cancer, for restaging EXAM: CT CHEST, ABDOMEN, AND PELVIS WITH CONTRAST TECHNIQUE: Multidetector CT imaging of the chest, abdomen and pelvis was performed following the standard protocol during bolus administration of intravenous contrast. CONTRAST:  178mL OMNIPAQUE IOHEXOL 300 MG/ML  SOLN COMPARISON:  CT chest dated 10/10/2020. CT chest abdomen pelvis dated 09/06/2020. FINDINGS: CT CHEST FINDINGS Cardiovascular: The heart is normal in size. No pericardial effusion. No evidence of thoracic aortic aneurysm. Mild atherosclerotic calcifications of the aortic arch. Coronary atherosclerosis the LAD and left circumflex. Right chest port terminates the cavoatrial junction. Mediastinum/Nodes: 10 mm subcarinal node (series 2/image 27). Low-density soft tissue in the low right paratracheal region (series 2/image 24) favors fluid. Additional bilobed fluid in the right epicardial region (series 2/image 36), unchanged. Visualized thyroid is unremarkable. Lungs/Pleura: Radiation changes in the medial right upper lobe and central right lower lobe/perihilar region. Volume loss in the right hemithorax. Moderate right pleural effusion, increased. Left lung is clear.  No suspicious pulmonary nodules. No pneumothorax. Musculoskeletal: Degenerative changes of the thoracic spine. CT ABDOMEN PELVIS FINDINGS Hepatobiliary: Progressive multifocal hepatic metastases in both lobes, including: --3.1 cm lesion in  segment 4A (series 2/image 50) --3.2 cm lesion in segment 3 (series 2/image 56) --3.3 cm lesion in the caudate (series 2/image 56) --2.8 cm lesion in segment 5 (series 2/image 57) Hepatic metastases were difficult to visualize on recent CT chest and measured less than 10 mm on prior CT abdomen/pelvis. Gallbladder is unremarkable. No intrahepatic or extrahepatic ductal dilatation. Pancreas: Within normal limits. Spleen: Within normal limits. Adrenals/Urinary Tract: Adrenal glands are within normal limits. Right renal cysts measuring up to 6.7 cm (series 2/image 34). Left kidney is within normal limits. No hydronephrosis. Bladder is within normal limits. Stomach/Bowel: Stomach is within normal limits. Moderate duodenal diverticulum. No evidence of bowel obstruction. Normal appendix (series 2/image 88). No colonic wall thickening or mass is evident on CT.  Vascular/Lymphatic: No evidence of abdominal aortic aneurysm. Atherosclerotic calcifications of the abdominal aorta and branch vessels. No suspicious abdominopelvic lymphadenopathy. Reproductive: Mild prostatomegaly. Other: No abdominopelvic ascites. Musculoskeletal: Degenerative changes of the lumbar spine. IMPRESSION: Radiation changes in the right hemithorax. Moderate right pleural effusion, increased. Progressive multifocal hepatic metastases in both lobes, with index lesions as above. Additional ancillary findings as above. Electronically Signed   By: Charline Bills M.D.   On: 12/10/2020 14:28   US Thoracentesis Asp Pleural space w/IMG guide  Result Date: 12/20/2020 INDICATION: Patient with history of extensive stage small cell lung cancer, recurrent right pleural effusion. Request received for therapeutic right thoracentesis. EXAM: ULTRASOUND GUIDED THERAPEUTIC RIGHT THORACENTESIS MEDICATIONS: 1% lidocaine to skin and subcutaneous tissue COMPLICATIONS: None immediate. PROCEDURE: An ultrasound guided thoracentesis was thoroughly discussed with the patient  and questions answered. The benefits, risks, alternatives and complications were also discussed. The patient understands and wishes to proceed with the procedure. Written consent was obtained. Ultrasound was performed to localize and mark an adequate pocket of fluid in the right chest. The area was then prepped and draped in the normal sterile fashion. 1% Lidocaine was used for local anesthesia. Under ultrasound guidance a 6 Fr Safe-T-Centesis catheter was introduced. Thoracentesis was performed. The catheter was removed and a dressing applied. FINDINGS: A total of approximately 750 cc of amber fluid was removed. IMPRESSION: Successful ultrasound guided therapeutic right thoracentesis yielding 750 cc of pleural fluid. Read by: Jeananne Rama, PA-C Electronically Signed   By: Marliss Coots MD   On: 12/20/2020 12:06    ASSESSMENT AND PLAN: This is a very pleasant 70 years old white male recently diagnosed with extensive stage (T4, N3, M1 C) small cell lung cancer presented with large right upper lobe lung mass in addition to mediastinal and right supraclavicular lymphadenopathy and multiple metastatic liver lesions diagnosed in April 2021. The patient underwent systemic chemotherapy with carboplatin for AUC of 5 on day 1, etoposide 100 mg/M2 on days 1, 2 and 3 in addition to Cosela for myeloprotection during the days of the chemotherapy.  He will also receive immunotherapy with Imfinzi on day one of the chemotherapy cycle.  Status post 9 cycles.  Starting from cycle #5 he is on maintenance treatment with Imfinzi 1500 mg IV every 4 weeks. The patient has been tolerating this treatment well with no concerning adverse effect except for the worsening cough and shortness of breath recently.   He had repeat CT scan of the chest, abdomen pelvis performed recently.  I personally and independently reviewed the scan images and discussed the results with the patient and his wife today. Unfortunately his scan showed evidence  for disease progression with increase in the right pleural effusion as well as enlargement of multiple liver lesions. I recommended for the patient to discontinue his current maintenance treatment with Imfinzi at this point. He resumed systemic chemotherapy with carboplatin for AUC of 5 on day 1, etoposide 100 mg/M2 on days 1, 2 and 3 with Tecentriq 1200 mg every 3 weeks as well as Cosela on the days of the chemotherapy.  He is status post 1 cycle.  He tolerated the first cycle of his treatment fairly well. His CBC is unremarkable today. I recommended for the patient to continue his treatment with systemic chemotherapy as planned. He will come back for follow-up visit in 2 weeks for evaluation before starting cycle #2. The patient was advised to call immediately if he has any concerning symptoms in the interval. The patient  voices understanding of current disease status and treatment options and is in agreement with the current care plan.  All questions were answered. The patient knows to call the clinic with any problems, questions or concerns. We can certainly see the patient much sooner if necessary.  Disclaimer: This note was dictated with voice recognition software. Similar sounding words can inadvertently be transcribed and may not be corrected upon review.

## 2021-01-01 ENCOUNTER — Inpatient Hospital Stay: Payer: Medicare Other

## 2021-01-01 ENCOUNTER — Other Ambulatory Visit: Payer: Self-pay

## 2021-01-01 DIAGNOSIS — C787 Secondary malignant neoplasm of liver and intrahepatic bile duct: Secondary | ICD-10-CM | POA: Diagnosis not present

## 2021-01-01 DIAGNOSIS — Z5111 Encounter for antineoplastic chemotherapy: Secondary | ICD-10-CM | POA: Diagnosis not present

## 2021-01-01 DIAGNOSIS — Z5112 Encounter for antineoplastic immunotherapy: Secondary | ICD-10-CM | POA: Diagnosis not present

## 2021-01-01 DIAGNOSIS — C3411 Malignant neoplasm of upper lobe, right bronchus or lung: Secondary | ICD-10-CM

## 2021-01-01 DIAGNOSIS — D709 Neutropenia, unspecified: Secondary | ICD-10-CM | POA: Diagnosis not present

## 2021-01-01 DIAGNOSIS — R5382 Chronic fatigue, unspecified: Secondary | ICD-10-CM

## 2021-01-01 DIAGNOSIS — J90 Pleural effusion, not elsewhere classified: Secondary | ICD-10-CM | POA: Diagnosis not present

## 2021-01-01 LAB — CMP (CANCER CENTER ONLY)
ALT: 19 U/L (ref 0–44)
AST: 18 U/L (ref 15–41)
Albumin: 3.9 g/dL (ref 3.5–5.0)
Alkaline Phosphatase: 60 U/L (ref 38–126)
Anion gap: 9 (ref 5–15)
BUN: 21 mg/dL (ref 8–23)
CO2: 28 mmol/L (ref 22–32)
Calcium: 9.1 mg/dL (ref 8.9–10.3)
Chloride: 102 mmol/L (ref 98–111)
Creatinine: 1.2 mg/dL (ref 0.61–1.24)
GFR, Estimated: >60 mL/min (ref >60)
Glucose, Bld: 198 mg/dL — ABNORMAL HIGH (ref 70–99)
Potassium: 4.3 mmol/L (ref 3.5–5.1)
Sodium: 139 mmol/L (ref 135–145)
Total Bilirubin: 0.4 mg/dL (ref 0.3–1.2)
Total Protein: 6.6 g/dL (ref 6.5–8.1)

## 2021-01-01 LAB — CBC WITH DIFFERENTIAL (CANCER CENTER ONLY)
Abs Immature Granulocytes: 0 10*3/uL (ref 0.00–0.07)
Basophils Absolute: 0 10*3/uL (ref 0.0–0.1)
Basophils Relative: 2 %
Eosinophils Absolute: 0.1 10*3/uL (ref 0.0–0.5)
Eosinophils Relative: 2 %
HCT: 39.4 % (ref 39.0–52.0)
Hemoglobin: 12.8 g/dL — ABNORMAL LOW (ref 13.0–17.0)
Immature Granulocytes: 0 %
Lymphocytes Relative: 37 %
Lymphs Abs: 0.8 10*3/uL (ref 0.7–4.0)
MCH: 28.8 pg (ref 26.0–34.0)
MCHC: 32.5 g/dL (ref 30.0–36.0)
MCV: 88.7 fL (ref 80.0–100.0)
Monocytes Absolute: 0.4 10*3/uL (ref 0.1–1.0)
Monocytes Relative: 17 %
Neutro Abs: 0.9 10*3/uL — ABNORMAL LOW (ref 1.7–7.7)
Neutrophils Relative %: 42 %
Platelet Count: 78 10*3/uL — ABNORMAL LOW (ref 150–400)
RBC: 4.44 MIL/uL (ref 4.22–5.81)
RDW: 13.5 % (ref 11.5–15.5)
WBC Count: 2.3 10*3/uL — ABNORMAL LOW (ref 4.0–10.5)
nRBC: 0 % (ref 0.0–0.2)

## 2021-01-01 LAB — TSH: TSH: 0.676 u[IU]/mL (ref 0.320–4.118)

## 2021-01-07 NOTE — Progress Notes (Signed)
[facility] OFFICE PROGRESS NOTE  Doreene Nest, NP [address] [place] [address]  DIAGNOSIS: Relapsed extensive stage (T4, N3, M1c) small cell lung cancer diagnosed in April 2021 and presented with large right upper lobe/right hilar mass with ipsilateral and contralateral mediastinal and right supraclavicular lymphadenopathy in addition to multiple liver lesios. He has disease progression after the first line of chemotherapy in December 2021.  PRIOR THERAPY: 1) Palliative radiotherapy to the right upper lobe lung mass under the care of Dr. Mitzi Hansen. 2) Systemic chemotherapy with carboplatin for AUC of 5 on day 1, etoposide 100 mg/M2 on days 1, 2 and 3 in addition to Imfinzi 1500 mg IV every 3 weeks with chemotherapy then every 4 weeks for maintenance if the patient has no evidence for progression.  He will also receive Cosela 240 mg/m2 on the days of the chemotherapy.  Status post 9 cycles.  Starting from cycle #5 the patient was on maintenance treatment with immunotherapy with Imfinzi 1500 mg IV every 4 weeks. Last dose of chemotherapy was given on November 13, 2020. This treatment was discontinued secondary to disease progression.  CURRENT THERAPY: Systemic chemotherapy with carboplatin for AUC of 5 on day 1, etoposide 100 mg/M2 on days 1, 2 and 3, Tecentriq 1200 mg IV every 3 weeks as well as Cosela 250 mg/M2 on the days of the chemotherapy every 3 weeks.  First dose December 18, 2020. Status post 1 cycle.    INTERVAL HISTORY: Dayton Kenley 70 y.o. male returns to the clinic today for a follow-up visit accompanied by his wife.  The patient is feeling fairly well today without any concerning complaints. The patient recently started treatment with carboplatin, etoposide and Tecentriq.  He is status post 1 cycle and tolerated it well.  The patient denies any recent fever, chills, night sweats, weight loss.  Patient has a history of pleural effusion requiring thoracentesis. His  last thoracentesis was earlier this month obtaining 500-600's cc's. He states his breathing has improved at this time. He states when he feels the fluid re-accumulates, he often feels achiness in his back, wheezing, and coughing.  When he has a cough, he takes tussinex. He is requesting a refill today. Denies any hemoptysis.  He denies any nausea vomiting. He has diarrhea and constipation depending on what he eats. He denies any headache or visual changes.  He denies any rashes or skin changes.  The patient is here today for evaluation before starting day 1 of cycle #2.  MEDICAL HISTORY: Past Medical History:  Diagnosis Date  . Cancer (HCC)   . Chickenpox   . Chronic knee pain   . Chronic low back pain   . Essential hypertension   . GERD (gastroesophageal reflux disease)   . OSA (obstructive sleep apnea)   . Testosterone deficiency   . Type 2 diabetes mellitus (HCC)     ALLERGIES:  is allergic to bupropion.  MEDICATIONS:  Current Outpatient Medications  Medication Sig Dispense Refill  . acetaminophen (TYLENOL) 500 MG tablet Take 1,500 mg by mouth every 8 (eight) hours as needed for moderate pain or headache. (Patient not taking: No sig reported)    . albuterol (VENTOLIN HFA) 108 (90 Base) MCG/ACT inhaler Inhale 2 puffs into the lungs every 6 (six) hours as needed for wheezing or shortness of breath. 18 g 0  . amLODipine (NORVASC) 10 MG tablet TAKE 1 TABLET BY MOUTH EVERY DAY 90 tablet 1  . Black Pepper-Turmeric (TURMERIC COMPLEX/BLACK  PEPPER PO) Take 1 tablet by mouth in the morning and at bedtime.    . blood glucose meter kit and supplies KIT Dispense based on patient and insurance preference. Use up to four times daily as directed. 1 each 0  . chlorpheniramine-HYDROcodone (TUSSIONEX) 10-8 MG/5ML SUER Take 5 mLs by mouth at bedtime as needed for cough. (Patient not taking: Reported on 12/25/2020) 140 mL 0  . Coenzyme Q10 ([employer] COQ-10) 200 MG capsule Take 200 mg by mouth every evening.      Marland Kitchen ELIQUIS 5 MG TABS tablet TAKE 1 TABLET BY MOUTH TWICE A DAY 180 tablet 2  . esomeprazole (NEXIUM) 20 MG capsule Take 20 mg by mouth daily.     . fluticasone (FLONASE) 50 MCG/ACT nasal spray Place 2 sprays into both nostrils daily as needed for allergies.    Marland Kitchen gabapentin (NEURONTIN) 300 MG capsule Take 300 mg by mouth 2 (two) times daily.     Marland Kitchen glipiZIDE (GLUCOTROL) 10 MG tablet TAKE 1 TABLET (10 MG TOTAL) BY MOUTH 2 (TWO) TIMES DAILY BEFORE A MEAL. FOR DIABETES. 180 tablet 2  . insulin aspart (NOVOLOG FLEXPEN) 100 UNIT/ML FlexPen Inject three times daily per sliding scale. (Patient not taking: No sig reported) 15 mL 0  . Insulin Glargine (BASAGLAR KWIKPEN) 100 UNIT/ML Inject 20 Units into the skin at bedtime. For diabetes. 15 mL 3  . Insulin Pen Needle (BD PEN NEEDLE NANO U/F) 32G X 4 MM MISC Use with insulin as prescribed  Dx Code: E11.9 100 each 2  . KRILL OIL PO Take 350 mg by mouth daily.    . Lancets MISC USE UP TO 3 TIMES DAILY AS DIRECTED 100 each 2  . lidocaine-prilocaine (EMLA) cream Apply 1 application topically as needed. 30 g 2  . lisinopril (ZESTRIL) 5 MG tablet TAKE 1 TABLET BY MOUTH EVERY DAY 90 tablet 1  . Melatonin 10 MG CAPS Take 10 mg by mouth at bedtime.    . metFORMIN (GLUCOPHAGE) 1000 MG tablet TAKE 1 TABLET (1,000 MG TOTAL) BY MOUTH 2 (TWO) TIMES DAILY WITH A MEAL. 180 tablet 0  . Multiple Vitamin (MULTI-VITAMINS) TABS Take 1 tablet by mouth daily.     Glory Rosebush ULTRA test strip USE UP TO 4 TIMES DAILY AS DIRECTED 100 strip 5  . OVER THE COUNTER MEDICATION Take by mouth.    Marland Kitchen oxymetazoline (AFRIN) 0.05 % nasal spray Place 1 spray into both nostrils 2 (two) times daily as needed for congestion.    . pioglitazone (ACTOS) 45 MG tablet TAKE 1 TABLET BY MOUTH EVERY DAY 90 tablet 1  . pravastatin (PRAVACHOL) 40 MG tablet TAKE 1 TABLET BY MOUTH EVERY DAY IN THE EVENING FOR CHOLESTEROL 90 tablet 1  . prochlorperazine (COMPAZINE) 10 MG tablet Take 1 tablet (10 mg total) by  mouth every 6 (six) hours as needed for nausea or vomiting. (Patient not taking: No sig reported) 30 tablet 0  . sildenafil (REVATIO) 20 MG tablet Take 3-5 tablets by mouth 1 hour prior to intercourse as needed. 50 tablet 0  . SYRINGE-NEEDLE, DISP, 3 ML (B-D 3CC LUER-LOK SYR 22GX1") 22G X 1" 3 ML MISC USE AS INSTRUCTED FOR TESTOSTERONE INJECTION EVERY 2 WEEKS 10 each 2  . testosterone cypionate (DEPOTESTOSTERONE CYPIONATE) 200 MG/ML injection Inject 200 mg into the muscle every 14 (fourteen) days.     No current facility-administered medications for this visit.    SURGICAL HISTORY:  Past Surgical History:  Procedure Laterality Date  . COLONOSCOPY  WITH PROPOFOL N/A 12/17/2018   Procedure: COLONOSCOPY WITH PROPOFOL;  Surgeon: Virgel Manifold, MD;  Location: [facility] ENDOSCOPY;  Service: Gastroenterology;  Laterality: N/A;  . IR IMAGING GUIDED PORT INSERTION  04/17/2020  . JOINT REPLACEMENT Bilateral   . REPLACEMENT TOTAL KNEE BILATERAL  2015  . TONSILLECTOMY  1960    REVIEW OF SYSTEMS:   Review of Systems  Constitutional: Negative for appetite change, chills, fatigue, fever and unexpected weight change.  HENT: Negative for mouth sores, nosebleeds, sore throat and trouble swallowing.   Eyes: Negative for eye problems and icterus.  Respiratory: Positive for occasional wheezing. Negative for cough, hemoptysis, shortness of breath and wheezing.   Cardiovascular: Negative for chest pain and leg swelling.  Gastrointestinal: Positive for occasional diarrhea or constipation depending on what he eats. Negative for abdominal pain, constipation, diarrhea, nausea and vomiting.  Genitourinary: Negative for bladder incontinence, difficulty urinating, dysuria, frequency and hematuria.   Musculoskeletal: Negative for back pain, gait problem, neck pain and neck stiffness.  Skin: Negative for itching and rash.  Neurological: Negative for dizziness, extremity weakness, gait problem, headaches, light-headedness  and seizures.  Hematological: Negative for adenopathy. Does not bruise/bleed easily.  Psychiatric/Behavioral: Negative for confusion, depression and sleep disturbance. The patient is not nervous/anxious.     PHYSICAL EXAMINATION:  There were no vitals taken for this visit.  ECOG PERFORMANCE STATUS: 1 - Symptomatic but completely ambulatory  Physical Exam  Constitutional: Oriented to person, place, and time and well-developed, well-nourished, and in no distress.  HENT:  Head: Normocephalic and atraumatic.  Mouth/Throat: Oropharynx is clear and moist. No oropharyngeal exudate.  Eyes: Conjunctivae are normal. Right eye exhibits no discharge. Left eye exhibits no discharge. No scleral icterus.  Neck: Normal range of motion. Neck supple.  Cardiovascular: Normal rate, regular rhythm, normal heart sounds and intact distal pulses.   Pulmonary/Chest: Effort normal. Decreased breath sounds in right lung. No respiratory distress. No wheezes. No rales.  Abdominal: Soft. Bowel sounds are normal. Exhibits no distension and no mass. There is no tenderness.  Musculoskeletal: Normal range of motion. Exhibits no edema.  Lymphadenopathy:    No cervical adenopathy.  Neurological: Alert and oriented to person, place, and time. Exhibits normal muscle tone. Gait normal. Coordination normal.  Skin: Skin is warm and dry. No rash noted. Not diaphoretic. No erythema. No pallor.  Psychiatric: Mood, memory and judgment normal.  Vitals reviewed.  LABORATORY DATA: Lab Results  Component Value Date   WBC 2.3 (L) 01/01/2021   HGB 12.8 (L) 01/01/2021   HCT 39.4 01/01/2021   MCV 88.7 01/01/2021   PLT 78 (L) 01/01/2021      Chemistry      Component Value Date/Time   NA 139 01/01/2021 1007   K 4.3 01/01/2021 1007   CL 102 01/01/2021 1007   CO2 28 01/01/2021 1007   BUN 21 01/01/2021 1007   CREATININE 1.20 01/01/2021 1007      Component Value Date/Time   CALCIUM 9.1 01/01/2021 1007   ALKPHOS 60 01/01/2021  1007   AST 18 01/01/2021 1007   ALT 19 01/01/2021 1007   BILITOT 0.4 01/01/2021 1007       RADIOGRAPHIC STUDIES:  DG Chest 1 View  Result Date: 12/20/2020 CLINICAL DATA:  Recent thoracentesis.  History of lung carcinoma EXAM: CHEST  1 VIEW COMPARISON:  Chest CT December 10, 2020; chest radiograph September 14, 2020 FINDINGS: No pneumothorax. No appreciable residual pleural effusion. Port-A-Cath tip is in superior vena cava. There is scarring with  retraction in the right upper lobe. There is volume loss in the right lower lobe more medially. The left lung is clear. Heart size is upper normal, stable. Pulmonary vascularity on the left is normal. Distortion of pulmonary vascularity on the right due to retraction/scarring noted. No adenopathy evident by radiography. No bone lesions. IMPRESSION: No pneumothorax. No appreciable residual pleural effusion. Scarring with retraction in the right upper lobe with a lesser degree of retraction in the medial right base. Distortion of pulmonary vascularity on the right due to retraction/scarring. Left lung clear. Stable cardiac silhouette. Port-A-Cath tip in superior vena cava. Electronically Signed   By: Lowella Grip III M.D.   On: 12/20/2020 11:31   CT Chest W Contrast  Result Date: 12/10/2020 CLINICAL DATA:  Small cell lung cancer, for restaging EXAM: CT CHEST, ABDOMEN, AND PELVIS WITH CONTRAST TECHNIQUE: Multidetector CT imaging of the chest, abdomen and pelvis was performed following the standard protocol during bolus administration of intravenous contrast. CONTRAST:  163mL OMNIPAQUE IOHEXOL 300 MG/ML  SOLN COMPARISON:  CT chest dated 10/10/2020. CT chest abdomen pelvis dated 09/06/2020. FINDINGS: CT CHEST FINDINGS Cardiovascular: The heart is normal in size. No pericardial effusion. No evidence of thoracic aortic aneurysm. Mild atherosclerotic calcifications of the aortic arch. Coronary atherosclerosis the LAD and left circumflex. Right chest port  terminates the cavoatrial junction. Mediastinum/Nodes: 10 mm subcarinal node (series 2/image 27). Low-density soft tissue in the low right paratracheal region (series 2/image 24) favors fluid. Additional bilobed fluid in the right epicardial region (series 2/image 36), unchanged. Visualized thyroid is unremarkable. Lungs/Pleura: Radiation changes in the medial right upper lobe and central right lower lobe/perihilar region. Volume loss in the right hemithorax. Moderate right pleural effusion, increased. Left lung is clear.  No suspicious pulmonary nodules. No pneumothorax. Musculoskeletal: Degenerative changes of the thoracic spine. CT ABDOMEN PELVIS FINDINGS Hepatobiliary: Progressive multifocal hepatic metastases in both lobes, including: --3.1 cm lesion in segment 4A (series 2/image 50) --3.2 cm lesion in segment 3 (series 2/image 56) --3.3 cm lesion in the caudate (series 2/image 56) --2.8 cm lesion in segment 5 (series 2/image 57) Hepatic metastases were difficult to visualize on recent CT chest and measured less than 10 mm on prior CT abdomen/pelvis. Gallbladder is unremarkable. No intrahepatic or extrahepatic ductal dilatation. Pancreas: Within normal limits. Spleen: Within normal limits. Adrenals/Urinary Tract: Adrenal glands are within normal limits. Right renal cysts measuring up to 6.7 cm (series 2/image 34). Left kidney is within normal limits. No hydronephrosis. Bladder is within normal limits. Stomach/Bowel: Stomach is within normal limits. Moderate duodenal diverticulum. No evidence of bowel obstruction. Normal appendix (series 2/image 88). No colonic wall thickening or mass is evident on CT. Vascular/Lymphatic: No evidence of abdominal aortic aneurysm. Atherosclerotic calcifications of the abdominal aorta and branch vessels. No suspicious abdominopelvic lymphadenopathy. Reproductive: Mild prostatomegaly. Other: No abdominopelvic ascites. Musculoskeletal: Degenerative changes of the lumbar spine.  IMPRESSION: Radiation changes in the right hemithorax. Moderate right pleural effusion, increased. Progressive multifocal hepatic metastases in both lobes, with index lesions as above. Additional ancillary findings as above. Electronically Signed   By: Julian Hy M.D.   On: 12/10/2020 14:28   CT Abdomen Pelvis W Contrast  Result Date: 12/10/2020 CLINICAL DATA:  Small cell lung cancer, for restaging EXAM: CT CHEST, ABDOMEN, AND PELVIS WITH CONTRAST TECHNIQUE: Multidetector CT imaging of the chest, abdomen and pelvis was performed following the standard protocol during bolus administration of intravenous contrast. CONTRAST:  151mL OMNIPAQUE IOHEXOL 300 MG/ML  SOLN COMPARISON:  CT chest  dated 10/10/2020. CT chest abdomen pelvis dated 09/06/2020. FINDINGS: CT CHEST FINDINGS Cardiovascular: The heart is normal in size. No pericardial effusion. No evidence of thoracic aortic aneurysm. Mild atherosclerotic calcifications of the aortic arch. Coronary atherosclerosis the LAD and left circumflex. Right chest port terminates the cavoatrial junction. Mediastinum/Nodes: 10 mm subcarinal node (series 2/image 27). Low-density soft tissue in the low right paratracheal region (series 2/image 24) favors fluid. Additional bilobed fluid in the right epicardial region (series 2/image 36), unchanged. Visualized thyroid is unremarkable. Lungs/Pleura: Radiation changes in the medial right upper lobe and central right lower lobe/perihilar region. Volume loss in the right hemithorax. Moderate right pleural effusion, increased. Left lung is clear.  No suspicious pulmonary nodules. No pneumothorax. Musculoskeletal: Degenerative changes of the thoracic spine. CT ABDOMEN PELVIS FINDINGS Hepatobiliary: Progressive multifocal hepatic metastases in both lobes, including: --3.1 cm lesion in segment 4A (series 2/image 50) --3.2 cm lesion in segment 3 (series 2/image 56) --3.3 cm lesion in the caudate (series 2/image 56) --2.8 cm lesion in  segment 5 (series 2/image 57) Hepatic metastases were difficult to visualize on recent CT chest and measured less than 10 mm on prior CT abdomen/pelvis. Gallbladder is unremarkable. No intrahepatic or extrahepatic ductal dilatation. Pancreas: Within normal limits. Spleen: Within normal limits. Adrenals/Urinary Tract: Adrenal glands are within normal limits. Right renal cysts measuring up to 6.7 cm (series 2/image 34). Left kidney is within normal limits. No hydronephrosis. Bladder is within normal limits. Stomach/Bowel: Stomach is within normal limits. Moderate duodenal diverticulum. No evidence of bowel obstruction. Normal appendix (series 2/image 88). No colonic wall thickening or mass is evident on CT. Vascular/Lymphatic: No evidence of abdominal aortic aneurysm. Atherosclerotic calcifications of the abdominal aorta and branch vessels. No suspicious abdominopelvic lymphadenopathy. Reproductive: Mild prostatomegaly. Other: No abdominopelvic ascites. Musculoskeletal: Degenerative changes of the lumbar spine. IMPRESSION: Radiation changes in the right hemithorax. Moderate right pleural effusion, increased. Progressive multifocal hepatic metastases in both lobes, with index lesions as above. Additional ancillary findings as above. Electronically Signed   By: Julian Hy M.D.   On: 12/10/2020 14:28   US Thoracentesis Asp Pleural space w/IMG guide  Result Date: 12/20/2020 INDICATION: Patient with history of extensive stage small cell lung cancer, recurrent right pleural effusion. Request received for therapeutic right thoracentesis. EXAM: ULTRASOUND GUIDED THERAPEUTIC RIGHT THORACENTESIS MEDICATIONS: 1% lidocaine to skin and subcutaneous tissue COMPLICATIONS: None immediate. PROCEDURE: An ultrasound guided thoracentesis was thoroughly discussed with the patient and questions answered. The benefits, risks, alternatives and complications were also discussed. The patient understands and wishes to proceed with the  procedure. Written consent was obtained. Ultrasound was performed to localize and mark an adequate pocket of fluid in the right chest. The area was then prepped and draped in the normal sterile fashion. 1% Lidocaine was used for local anesthesia. Under ultrasound guidance a 6 Fr Safe-T-Centesis catheter was introduced. Thoracentesis was performed. The catheter was removed and a dressing applied. FINDINGS: A total of approximately 750 cc of amber fluid was removed. IMPRESSION: Successful ultrasound guided therapeutic right thoracentesis yielding 750 cc of pleural fluid. Read by: Rowe Robert, PA-C Electronically Signed   By: Ruthann Cancer MD   On: 12/20/2020 12:06     ASSESSMENT/PLAN:  This is a very pleasant29year oldCaucasianmale recently diagnosed with extensive stage (T4, N3, M1C) small cell lung cancer presented with large right upper lobe lung mass in addition to mediastinal and right supraclavicular lymphadenopathy and multiple metastatic liver lesions diagnosed in April 2021.  The patient initially underwent systemic  chemotherapy with carboplatin for an AUC of 5 on day 1, etoposide 100 mg per metered squared on days 1, 2, and 3 in addition to Lantana for myeloprotection.  He also received immunotherapy with Imfinzi on day 1 of every chemotherapy cycle.  He is status post 9 cycles.  Starting from cycle #5 he was on single agent immunotherapy with Imfinzi IV every 4 weeks.  The patient had evidence of disease progression on his scan from December 2021.   His treatment was subsequently changed to chemotherapy with carboplatin for an AUC of 5 on day 1, etoposide 100 mg per metered squared on days 1, 2, and 3, and Tecentriq on day 1 1200 mg IV every 3 weeks as well as Coselta.  He is status post 1 cycle.  He tolerated treatment fairly well today.   Labs were reviewed. I reviewed his labs with Dr. Julien Nordmann.  Recommend he proceed with cycle #2 today scheduled.  He has neutropenia on labs. We will  arrange for the patient to receive Onpro on day 3 of his treatment moving forward to prevent neutropenia and dose delays. The patient denies signs or symptoms of infection at this time.   I will arrange for restaging CT scan of the chest, abdomen, and pelvis prior to starting his next cycle of treatment.  We will see him back for follow-up visit in 3 weeks for evaluation and to review his scan results before starting cycle #3.  The patient states he knows when he re-accumulates his pleural effusion. He had decreased breath sounds in his right lung. He is asymptomatic at this time. I advised him to call in the interval if he develops respiratory complains and I would be happy to arrange for CXR and thoracentesis if needed. Otherwise, he is due for a restaging scan before his next cycle of treatment and we will assess his fluid at that time.   I have refilled tussinex.   The patient was advised to call immediately if she has any concerning symptoms in the interval. The patient voices understanding of current disease status and treatment options and is in agreement with the current care plan. All questions were answered. The patient knows to call the clinic with any problems, questions or concerns. We can certainly see the patient much sooner if necessary       No orders of the defined types were placed in this encounter.    I spent 30-39 minutes for this encounter.   Adam Wise L Soleia Badolato, PA-C 01/07/21

## 2021-01-08 ENCOUNTER — Other Ambulatory Visit: Payer: Self-pay | Admitting: Primary Care

## 2021-01-08 ENCOUNTER — Ambulatory Visit: Payer: Medicare Other | Admitting: Physician Assistant

## 2021-01-08 ENCOUNTER — Other Ambulatory Visit: Payer: Medicare Other

## 2021-01-08 ENCOUNTER — Ambulatory Visit: Payer: Medicare Other

## 2021-01-08 DIAGNOSIS — E119 Type 2 diabetes mellitus without complications: Secondary | ICD-10-CM

## 2021-01-08 DIAGNOSIS — E1169 Type 2 diabetes mellitus with other specified complication: Secondary | ICD-10-CM

## 2021-01-08 DIAGNOSIS — I10 Essential (primary) hypertension: Secondary | ICD-10-CM

## 2021-01-09 ENCOUNTER — Encounter: Payer: Self-pay | Admitting: Physician Assistant

## 2021-01-09 ENCOUNTER — Inpatient Hospital Stay: Payer: Medicare Other

## 2021-01-09 ENCOUNTER — Inpatient Hospital Stay (HOSPITAL_BASED_OUTPATIENT_CLINIC_OR_DEPARTMENT_OTHER): Payer: Medicare Other | Admitting: Physician Assistant

## 2021-01-09 ENCOUNTER — Other Ambulatory Visit: Payer: Self-pay

## 2021-01-09 VITALS — BP 145/87 | HR 95 | Temp 98.4°F | Resp 17 | Ht 69.0 in | Wt 281.6 lb

## 2021-01-09 DIAGNOSIS — Z5112 Encounter for antineoplastic immunotherapy: Secondary | ICD-10-CM

## 2021-01-09 DIAGNOSIS — Z5111 Encounter for antineoplastic chemotherapy: Secondary | ICD-10-CM | POA: Diagnosis not present

## 2021-01-09 DIAGNOSIS — C787 Secondary malignant neoplasm of liver and intrahepatic bile duct: Secondary | ICD-10-CM | POA: Diagnosis not present

## 2021-01-09 DIAGNOSIS — J90 Pleural effusion, not elsewhere classified: Secondary | ICD-10-CM | POA: Diagnosis not present

## 2021-01-09 DIAGNOSIS — R059 Cough, unspecified: Secondary | ICD-10-CM | POA: Diagnosis not present

## 2021-01-09 DIAGNOSIS — C3411 Malignant neoplasm of upper lobe, right bronchus or lung: Secondary | ICD-10-CM

## 2021-01-09 DIAGNOSIS — D709 Neutropenia, unspecified: Secondary | ICD-10-CM | POA: Diagnosis not present

## 2021-01-09 LAB — CMP (CANCER CENTER ONLY)
ALT: 20 U/L (ref 0–44)
AST: 19 U/L (ref 15–41)
Albumin: 3.6 g/dL (ref 3.5–5.0)
Alkaline Phosphatase: 64 U/L (ref 38–126)
Anion gap: 8 (ref 5–15)
BUN: 17 mg/dL (ref 8–23)
CO2: 27 mmol/L (ref 22–32)
Calcium: 8.8 mg/dL — ABNORMAL LOW (ref 8.9–10.3)
Chloride: 105 mmol/L (ref 98–111)
Creatinine: 1.21 mg/dL (ref 0.61–1.24)
GFR, Estimated: >60 mL/min (ref >60)
Glucose, Bld: 182 mg/dL — ABNORMAL HIGH (ref 70–99)
Potassium: 4.4 mmol/L (ref 3.5–5.1)
Sodium: 140 mmol/L (ref 135–145)
Total Bilirubin: 0.3 mg/dL (ref 0.3–1.2)
Total Protein: 6.1 g/dL — ABNORMAL LOW (ref 6.5–8.1)

## 2021-01-09 LAB — CBC WITH DIFFERENTIAL (CANCER CENTER ONLY)
Abs Immature Granulocytes: 0.03 10*3/uL (ref 0.00–0.07)
Basophils Absolute: 0 10*3/uL (ref 0.0–0.1)
Basophils Relative: 2 %
Eosinophils Absolute: 0.1 10*3/uL (ref 0.0–0.5)
Eosinophils Relative: 3 %
HCT: 38.4 % — ABNORMAL LOW (ref 39.0–52.0)
Hemoglobin: 12.4 g/dL — ABNORMAL LOW (ref 13.0–17.0)
Immature Granulocytes: 1 %
Lymphocytes Relative: 29 %
Lymphs Abs: 0.7 10*3/uL (ref 0.7–4.0)
MCH: 28.8 pg (ref 26.0–34.0)
MCHC: 32.3 g/dL (ref 30.0–36.0)
MCV: 89.3 fL (ref 80.0–100.0)
Monocytes Absolute: 0.5 10*3/uL (ref 0.1–1.0)
Monocytes Relative: 20 %
Neutro Abs: 1.1 10*3/uL — ABNORMAL LOW (ref 1.7–7.7)
Neutrophils Relative %: 45 %
Platelet Count: 230 10*3/uL (ref 150–400)
RBC: 4.3 MIL/uL (ref 4.22–5.81)
RDW: 14.9 % (ref 11.5–15.5)
WBC Count: 2.3 10*3/uL — ABNORMAL LOW (ref 4.0–10.5)
nRBC: 0 % (ref 0.0–0.2)

## 2021-01-09 MED ORDER — SODIUM CHLORIDE 0.9 % IV SOLN
100.0000 mg/m2 | Freq: Once | INTRAVENOUS | Status: AC
  Administered 2021-01-09: 250 mg via INTRAVENOUS
  Filled 2021-01-09: qty 12.5

## 2021-01-09 MED ORDER — HEPARIN SOD (PORK) LOCK FLUSH 100 UNIT/ML IV SOLN
500.0000 [IU] | Freq: Once | INTRAVENOUS | Status: AC | PRN
  Administered 2021-01-09: 500 [IU]
  Filled 2021-01-09: qty 5

## 2021-01-09 MED ORDER — TRILACICLIB DIHYDROCHLORIDE INJECTION 300 MG
240.0000 mg/m2 | Freq: Once | INTRAVENOUS | Status: AC
  Administered 2021-01-09: 600 mg via INTRAVENOUS
  Filled 2021-01-09: qty 40

## 2021-01-09 MED ORDER — SODIUM CHLORIDE 0.9 % IV SOLN
150.0000 mg | Freq: Once | INTRAVENOUS | Status: AC
  Administered 2021-01-09: 150 mg via INTRAVENOUS
  Filled 2021-01-09: qty 150

## 2021-01-09 MED ORDER — SODIUM CHLORIDE 0.9 % IV SOLN
Freq: Once | INTRAVENOUS | Status: AC
  Filled 2021-01-09: qty 250

## 2021-01-09 MED ORDER — HYDROCOD POLST-CPM POLST ER 10-8 MG/5ML PO SUER: 5.0000 mL | Freq: Every evening | ORAL | 0 refills | Status: DC | PRN

## 2021-01-09 MED ORDER — PALONOSETRON HCL INJECTION 0.25 MG/5ML
0.2500 mg | Freq: Once | INTRAVENOUS | Status: AC
  Administered 2021-01-09: 0.25 mg via INTRAVENOUS

## 2021-01-09 MED ORDER — SODIUM CHLORIDE 0.9 % IV SOLN
628.5000 mg | Freq: Once | INTRAVENOUS | Status: AC
  Administered 2021-01-09: 630 mg via INTRAVENOUS
  Filled 2021-01-09: qty 63

## 2021-01-09 MED ORDER — SODIUM CHLORIDE 0.9 % IV SOLN
10.0000 mg | Freq: Once | INTRAVENOUS | Status: AC
  Administered 2021-01-09: 10 mg via INTRAVENOUS
  Filled 2021-01-09: qty 10

## 2021-01-09 MED ORDER — PALONOSETRON HCL INJECTION 0.25 MG/5ML
INTRAVENOUS | Status: AC
  Filled 2021-01-09: qty 5

## 2021-01-09 MED ORDER — SODIUM CHLORIDE 0.9 % IV SOLN
1200.0000 mg | Freq: Once | INTRAVENOUS | Status: AC
  Administered 2021-01-09: 1200 mg via INTRAVENOUS
  Filled 2021-01-09: qty 20

## 2021-01-09 MED ORDER — SODIUM CHLORIDE 0.9% FLUSH
10.0000 mL | INTRAVENOUS | Status: DC | PRN
  Administered 2021-01-09: 10 mL
  Filled 2021-01-09: qty 10

## 2021-01-09 NOTE — Patient Instructions (Signed)
[facility] Discharge Instructions for Patients Receiving Chemotherapy  Today you received the following chemotherapy agents *Tencentriq, Carboplatin, Etoposide.  To help prevent nausea and vomiting after your treatment, we encourage you to take your nausea medication as directed.   If you develop nausea and vomiting that is not controlled by your nausea medication, call the clinic.   BELOW ARE SYMPTOMS THAT SHOULD BE REPORTED IMMEDIATELY:  *FEVER GREATER THAN 100.5 F  *CHILLS WITH OR WITHOUT FEVER  NAUSEA AND VOMITING THAT IS NOT CONTROLLED WITH YOUR NAUSEA MEDICATION  *UNUSUAL SHORTNESS OF BREATH  *UNUSUAL BRUISING OR BLEEDING  TENDERNESS IN MOUTH AND THROAT WITH OR WITHOUT PRESENCE OF ULCERS  *URINARY PROBLEMS  *BOWEL PROBLEMS  UNUSUAL RASH Items with * indicate a potential emergency and should be followed up as soon as possible.  Feel free to call the clinic should you have any questions or concerns. The clinic phone number is [phone].  Please show the [facility] at check-in to the Emergency Department and triage nurse.

## 2021-01-09 NOTE — Progress Notes (Signed)
PA states okay to treat ANC 1.1

## 2021-01-09 NOTE — Telephone Encounter (Signed)
Pharmacy requests refill on: Amlodipine 10 mg, Lisinopril 5 mg, Pioglitazone 45 mg, Pravastatin 40 mg   LAST REFILL: 07/19/2020 (Q-90, R-1) LAST OV: 10/26/2020 NEXT OV: 04/29/2021 PHARMACY: [id number] [place], [place]  Hgb A1C (11/01/2019): 7.6  Ok to refill Pioglitazone or would you like patient to schedule lab appointment to re-check Hgb A1C?

## 2021-01-10 ENCOUNTER — Telehealth: Payer: Self-pay | Admitting: Primary Care

## 2021-01-10 ENCOUNTER — Other Ambulatory Visit: Payer: Self-pay

## 2021-01-10 ENCOUNTER — Inpatient Hospital Stay: Payer: Medicare Other

## 2021-01-10 DIAGNOSIS — C3411 Malignant neoplasm of upper lobe, right bronchus or lung: Secondary | ICD-10-CM | POA: Diagnosis not present

## 2021-01-10 DIAGNOSIS — Z5111 Encounter for antineoplastic chemotherapy: Secondary | ICD-10-CM | POA: Diagnosis not present

## 2021-01-10 DIAGNOSIS — D709 Neutropenia, unspecified: Secondary | ICD-10-CM | POA: Diagnosis not present

## 2021-01-10 DIAGNOSIS — Z5112 Encounter for antineoplastic immunotherapy: Secondary | ICD-10-CM | POA: Diagnosis not present

## 2021-01-10 DIAGNOSIS — J90 Pleural effusion, not elsewhere classified: Secondary | ICD-10-CM | POA: Diagnosis not present

## 2021-01-10 DIAGNOSIS — C787 Secondary malignant neoplasm of liver and intrahepatic bile duct: Secondary | ICD-10-CM | POA: Diagnosis not present

## 2021-01-10 MED ORDER — SODIUM CHLORIDE 0.9% FLUSH
10.0000 mL | INTRAVENOUS | Status: DC | PRN
  Administered 2021-01-10: 10 mL
  Filled 2021-01-10: qty 10

## 2021-01-10 MED ORDER — SODIUM CHLORIDE 0.9 % IV SOLN
Freq: Once | INTRAVENOUS | Status: AC
  Filled 2021-01-10: qty 250

## 2021-01-10 MED ORDER — HEPARIN SOD (PORK) LOCK FLUSH 100 UNIT/ML IV SOLN
500.0000 [IU] | Freq: Once | INTRAVENOUS | Status: AC | PRN
  Administered 2021-01-10: 500 [IU]
  Filled 2021-01-10: qty 5

## 2021-01-10 MED ORDER — SODIUM CHLORIDE 0.9 % IV SOLN
10.0000 mg | Freq: Once | INTRAVENOUS | Status: AC
  Administered 2021-01-10: 10 mg via INTRAVENOUS
  Filled 2021-01-10: qty 10

## 2021-01-10 MED ORDER — SODIUM CHLORIDE 0.9 % IV SOLN
100.0000 mg/m2 | Freq: Once | INTRAVENOUS | Status: AC
  Administered 2021-01-10: 250 mg via INTRAVENOUS
  Filled 2021-01-10: qty 12.5

## 2021-01-10 MED ORDER — TRILACICLIB DIHYDROCHLORIDE INJECTION 300 MG
240.0000 mg/m2 | Freq: Once | INTRAVENOUS | Status: AC
  Administered 2021-01-10: 600 mg via INTRAVENOUS
  Filled 2021-01-10: qty 40

## 2021-01-10 NOTE — Patient Instructions (Signed)
[facility] Discharge Instructions for Patients Receiving Chemotherapy  Today you received the following chemotherapy agents: etoposide  To help prevent nausea and vomiting after your treatment, we encourage you to take your nausea medication as directed.   If you develop nausea and vomiting that is not controlled by your nausea medication, call the clinic.   BELOW ARE SYMPTOMS THAT SHOULD BE REPORTED IMMEDIATELY:  *FEVER GREATER THAN 100.5 F  *CHILLS WITH OR WITHOUT FEVER  NAUSEA AND VOMITING THAT IS NOT CONTROLLED WITH YOUR NAUSEA MEDICATION  *UNUSUAL SHORTNESS OF BREATH  *UNUSUAL BRUISING OR BLEEDING  TENDERNESS IN MOUTH AND THROAT WITH OR WITHOUT PRESENCE OF ULCERS  *URINARY PROBLEMS  *BOWEL PROBLEMS  UNUSUAL RASH Items with * indicate a potential emergency and should be followed up as soon as possible.  Feel free to call the clinic should you have any questions or concerns. The clinic phone number is [phone].  Please show the CHEMO ALERT CARD at check-in to the Emergency Department and triage nurse.   

## 2021-01-10 NOTE — Telephone Encounter (Signed)
LVM for pt to rtn my call to schedule AWV with [facility].  

## 2021-01-10 NOTE — Telephone Encounter (Signed)
Patient has an appointment scheduled for February 2022.  Refill(s) sent to pharmacy.

## 2021-01-11 ENCOUNTER — Other Ambulatory Visit: Payer: Self-pay

## 2021-01-11 ENCOUNTER — Inpatient Hospital Stay: Payer: Medicare Other

## 2021-01-11 VITALS — BP 144/93 | HR 99 | Temp 98.2°F | Resp 17

## 2021-01-11 DIAGNOSIS — C3411 Malignant neoplasm of upper lobe, right bronchus or lung: Secondary | ICD-10-CM | POA: Diagnosis not present

## 2021-01-11 DIAGNOSIS — C787 Secondary malignant neoplasm of liver and intrahepatic bile duct: Secondary | ICD-10-CM | POA: Diagnosis not present

## 2021-01-11 DIAGNOSIS — Z5112 Encounter for antineoplastic immunotherapy: Secondary | ICD-10-CM | POA: Diagnosis not present

## 2021-01-11 DIAGNOSIS — D709 Neutropenia, unspecified: Secondary | ICD-10-CM | POA: Diagnosis not present

## 2021-01-11 DIAGNOSIS — Z5111 Encounter for antineoplastic chemotherapy: Secondary | ICD-10-CM | POA: Diagnosis not present

## 2021-01-11 DIAGNOSIS — J90 Pleural effusion, not elsewhere classified: Secondary | ICD-10-CM | POA: Diagnosis not present

## 2021-01-11 MED ORDER — SODIUM CHLORIDE 0.9 % IV SOLN
10.0000 mg | Freq: Once | INTRAVENOUS | Status: AC
  Administered 2021-01-11: 10 mg via INTRAVENOUS
  Filled 2021-01-11: qty 10

## 2021-01-11 MED ORDER — HEPARIN SOD (PORK) LOCK FLUSH 100 UNIT/ML IV SOLN
500.0000 [IU] | Freq: Once | INTRAVENOUS | Status: AC | PRN
  Administered 2021-01-11: 500 [IU]
  Filled 2021-01-11: qty 5

## 2021-01-11 MED ORDER — SODIUM CHLORIDE 0.9 % IV SOLN
100.0000 mg/m2 | Freq: Once | INTRAVENOUS | Status: AC
  Administered 2021-01-11: 250 mg via INTRAVENOUS
  Filled 2021-01-11: qty 12.5

## 2021-01-11 MED ORDER — TRILACICLIB DIHYDROCHLORIDE INJECTION 300 MG
240.0000 mg/m2 | Freq: Once | INTRAVENOUS | Status: AC
  Administered 2021-01-11: 600 mg via INTRAVENOUS
  Filled 2021-01-11: qty 40

## 2021-01-11 MED ORDER — SODIUM CHLORIDE 0.9 % IV SOLN
Freq: Once | INTRAVENOUS | Status: AC
  Filled 2021-01-11: qty 250

## 2021-01-11 MED ORDER — PEGFILGRASTIM 6 MG/0.6ML ~~LOC~~ PSKT
PREFILLED_SYRINGE | SUBCUTANEOUS | Status: AC
  Filled 2021-01-11: qty 0.6

## 2021-01-11 MED ORDER — SODIUM CHLORIDE 0.9% FLUSH
10.0000 mL | INTRAVENOUS | Status: DC | PRN
  Administered 2021-01-11: 10 mL
  Filled 2021-01-11: qty 10

## 2021-01-11 MED ORDER — PEGFILGRASTIM 6 MG/0.6ML ~~LOC~~ PSKT
6.0000 mg | PREFILLED_SYRINGE | Freq: Once | SUBCUTANEOUS | Status: AC
  Administered 2021-01-11: 6 mg via SUBCUTANEOUS

## 2021-01-11 NOTE — Progress Notes (Signed)
Patient was given instructions regarding Neulasta Onpro injection and was provided with an education pamphlet. Patient verbalized understanding.

## 2021-01-11 NOTE — Patient Instructions (Signed)
Adam Wise Discharge Instructions for Patients Receiving Chemotherapy  Today you received the following chemotherapy agents etoposide  To help prevent nausea and vomiting after your treatment, we encourage you to take your nausea medication as directed.   If you develop nausea and vomiting that is not controlled by your nausea medication, call the clinic.   BELOW ARE SYMPTOMS THAT SHOULD BE REPORTED IMMEDIATELY:  *FEVER GREATER THAN 100.5 F  *CHILLS WITH OR WITHOUT FEVER  NAUSEA AND VOMITING THAT IS NOT CONTROLLED WITH YOUR NAUSEA MEDICATION  *UNUSUAL SHORTNESS OF BREATH  *UNUSUAL BRUISING OR BLEEDING  TENDERNESS IN MOUTH AND THROAT WITH OR WITHOUT PRESENCE OF ULCERS  *URINARY PROBLEMS  *BOWEL PROBLEMS  UNUSUAL RASH Items with * indicate a potential emergency and should be followed up as soon as possible.  Feel free to call the clinic should you have any questions or concerns. The clinic phone number is [phone].  Please show the [place] at check-in to the Emergency Department and triage nurse.  Pegfilgrastim injection What is this medicine? PEGFILGRASTIM (PEG fil gra stim) is a long-acting granulocyte colony-stimulating factor that stimulates the growth of neutrophils, a type of white blood cell important in the body's fight against infection. It is used to reduce the incidence of fever and infection in patients with certain types of cancer who are receiving chemotherapy that affects the bone marrow, and to increase survival after being exposed to high doses of radiation. This medicine may be used for other purposes; ask your health care provider or pharmacist if you have questions. COMMON BRAND NAME(S): Rexene Edison, Ziextenzo What should I tell my health care provider before I take this medicine? They need to know if you have any of these conditions:  kidney disease  latex allergy  ongoing radiation  therapy  sickle cell disease  skin reactions to acrylic adhesives (On-Body Injector only)  an unusual or allergic reaction to pegfilgrastim, filgrastim, other medicines, foods, dyes, or preservatives  pregnant or trying to get pregnant  breast-feeding How should I use this medicine? This medicine is for injection under the skin. If you get this medicine at home, you will be taught how to prepare and give the pre-filled syringe or how to use the On-body Injector. Refer to the patient Instructions for Use for detailed instructions. Use exactly as directed. Tell your healthcare provider immediately if you suspect that the On-body Injector may not have performed as intended or if you suspect the use of the On-body Injector resulted in a missed or partial dose. It is important that you put your used needles and syringes in a special sharps container. Do not put them in a trash can. If you do not have a sharps container, call your pharmacist or healthcare provider to get one. Talk to your pediatrician regarding the use of this medicine in children. While this drug may be prescribed for selected conditions, precautions do apply. Overdosage: If you think you have taken too much of this medicine contact a poison control center or emergency room at once. NOTE: This medicine is only for you. Do not share this medicine with others. What if I miss a dose? It is important not to miss your dose. Call your doctor or health care professional if you miss your dose. If you miss a dose due to an On-body Injector failure or leakage, a new dose should be administered as soon as possible using a single prefilled syringe for manual use. What may  interact with this medicine? Interactions have not been studied. This list may not describe all possible interactions. Give your health care provider a list of all the medicines, herbs, non-prescription drugs, or dietary supplements you use. Also tell them if you smoke, drink  alcohol, or use illegal drugs. Some items may interact with your medicine. What should I watch for while using this medicine? Your condition will be monitored carefully while you are receiving this medicine. You may need blood work done while you are taking this medicine. Talk to your health care provider about your risk of cancer. You may be more at risk for certain types of cancer if you take this medicine. If you are going to need a MRI, CT scan, or other procedure, tell your doctor that you are using this medicine (On-Body Injector only). What side effects may I notice from receiving this medicine? Side effects that you should report to your doctor or health care professional as soon as possible:  allergic reactions (skin rash, itching or hives, swelling of the face, lips, or tongue)  back pain  dizziness  fever  pain, redness, or irritation at site where injected  pinpoint red spots on the skin  red or dark-brown urine  shortness of breath or breathing problems  stomach or side pain, or pain at the shoulder  swelling  tiredness  trouble passing urine or change in the amount of urine  unusual bruising or bleeding Side effects that usually do not require medical attention (report to your doctor or health care professional if they continue or are bothersome):  bone pain  muscle pain This list may not describe all possible side effects. Call your doctor for medical advice about side effects. You may report side effects to FDA at 1-800-FDA-1088. Where should I keep my medicine? Keep out of the reach of children. If you are using this medicine at home, you will be instructed on how to store it. Throw away any unused medicine after the expiration date on the label. NOTE: This sheet is a summary. It may not cover all possible information. If you have questions about this medicine, talk to your doctor, pharmacist, or health care provider.  2021 Elsevier/Gold Standard  (2019-12-23 13:20:51)

## 2021-01-16 ENCOUNTER — Other Ambulatory Visit: Payer: Self-pay

## 2021-01-16 ENCOUNTER — Inpatient Hospital Stay: Payer: Medicare Other | Attending: Internal Medicine

## 2021-01-16 DIAGNOSIS — Z79899 Other long term (current) drug therapy: Secondary | ICD-10-CM | POA: Diagnosis not present

## 2021-01-16 DIAGNOSIS — Z5189 Encounter for other specified aftercare: Secondary | ICD-10-CM | POA: Diagnosis not present

## 2021-01-16 DIAGNOSIS — C787 Secondary malignant neoplasm of liver and intrahepatic bile duct: Secondary | ICD-10-CM | POA: Diagnosis not present

## 2021-01-16 DIAGNOSIS — C3411 Malignant neoplasm of upper lobe, right bronchus or lung: Secondary | ICD-10-CM | POA: Diagnosis not present

## 2021-01-16 DIAGNOSIS — Z5112 Encounter for antineoplastic immunotherapy: Secondary | ICD-10-CM | POA: Diagnosis not present

## 2021-01-16 DIAGNOSIS — Z5111 Encounter for antineoplastic chemotherapy: Secondary | ICD-10-CM | POA: Insufficient documentation

## 2021-01-16 DIAGNOSIS — J90 Pleural effusion, not elsewhere classified: Secondary | ICD-10-CM | POA: Diagnosis not present

## 2021-01-16 LAB — CMP (CANCER CENTER ONLY)
ALT: 15 U/L (ref 0–44)
AST: 12 U/L — ABNORMAL LOW (ref 15–41)
Albumin: 3.7 g/dL (ref 3.5–5.0)
Alkaline Phosphatase: 88 U/L (ref 38–126)
Anion gap: 9 (ref 5–15)
BUN: 16 mg/dL (ref 8–23)
CO2: 28 mmol/L (ref 22–32)
Calcium: 8.6 mg/dL — ABNORMAL LOW (ref 8.9–10.3)
Chloride: 104 mmol/L (ref 98–111)
Creatinine: 1.1 mg/dL (ref 0.61–1.24)
GFR, Estimated: >60 mL/min (ref >60)
Glucose, Bld: 167 mg/dL — ABNORMAL HIGH (ref 70–99)
Potassium: 4.2 mmol/L (ref 3.5–5.1)
Sodium: 141 mmol/L (ref 135–145)
Total Bilirubin: 0.5 mg/dL (ref 0.3–1.2)
Total Protein: 6.1 g/dL — ABNORMAL LOW (ref 6.5–8.1)

## 2021-01-16 LAB — CBC WITH DIFFERENTIAL (CANCER CENTER ONLY)
Abs Immature Granulocytes: 0.19 10*3/uL — ABNORMAL HIGH (ref 0.00–0.07)
Basophils Absolute: 0 10*3/uL (ref 0.0–0.1)
Basophils Relative: 0 %
Eosinophils Absolute: 0.1 10*3/uL (ref 0.0–0.5)
Eosinophils Relative: 2 %
HCT: 38.4 % — ABNORMAL LOW (ref 39.0–52.0)
Hemoglobin: 12.5 g/dL — ABNORMAL LOW (ref 13.0–17.0)
Immature Granulocytes: 3 %
Lymphocytes Relative: 11 %
Lymphs Abs: 0.7 10*3/uL (ref 0.7–4.0)
MCH: 29.1 pg (ref 26.0–34.0)
MCHC: 32.6 g/dL (ref 30.0–36.0)
MCV: 89.5 fL (ref 80.0–100.0)
Monocytes Absolute: 0.7 10*3/uL (ref 0.1–1.0)
Monocytes Relative: 12 %
Neutro Abs: 4.3 10*3/uL (ref 1.7–7.7)
Neutrophils Relative %: 72 %
Platelet Count: 144 10*3/uL — ABNORMAL LOW (ref 150–400)
RBC: 4.29 MIL/uL (ref 4.22–5.81)
RDW: 14.6 % (ref 11.5–15.5)
WBC Count: 6 10*3/uL (ref 4.0–10.5)
nRBC: 0 % (ref 0.0–0.2)

## 2021-01-22 ENCOUNTER — Ambulatory Visit: Payer: Medicare Other

## 2021-01-23 ENCOUNTER — Inpatient Hospital Stay: Payer: Medicare Other

## 2021-01-23 ENCOUNTER — Other Ambulatory Visit: Payer: Self-pay

## 2021-01-23 DIAGNOSIS — Z5111 Encounter for antineoplastic chemotherapy: Secondary | ICD-10-CM | POA: Diagnosis not present

## 2021-01-23 DIAGNOSIS — Z5112 Encounter for antineoplastic immunotherapy: Secondary | ICD-10-CM | POA: Diagnosis not present

## 2021-01-23 DIAGNOSIS — R5382 Chronic fatigue, unspecified: Secondary | ICD-10-CM

## 2021-01-23 DIAGNOSIS — C787 Secondary malignant neoplasm of liver and intrahepatic bile duct: Secondary | ICD-10-CM | POA: Diagnosis not present

## 2021-01-23 DIAGNOSIS — C3411 Malignant neoplasm of upper lobe, right bronchus or lung: Secondary | ICD-10-CM

## 2021-01-23 DIAGNOSIS — J90 Pleural effusion, not elsewhere classified: Secondary | ICD-10-CM | POA: Diagnosis not present

## 2021-01-23 DIAGNOSIS — Z79899 Other long term (current) drug therapy: Secondary | ICD-10-CM | POA: Diagnosis not present

## 2021-01-23 LAB — CMP (CANCER CENTER ONLY)
ALT: 23 U/L (ref 0–44)
AST: 16 U/L (ref 15–41)
Albumin: 3.9 g/dL (ref 3.5–5.0)
Alkaline Phosphatase: 92 U/L (ref 38–126)
Anion gap: 9 (ref 5–15)
BUN: 12 mg/dL (ref 8–23)
CO2: 28 mmol/L (ref 22–32)
Calcium: 9.1 mg/dL (ref 8.9–10.3)
Chloride: 103 mmol/L (ref 98–111)
Creatinine: 1.29 mg/dL — ABNORMAL HIGH (ref 0.61–1.24)
GFR, Estimated: 60 mL/min — ABNORMAL LOW (ref >60)
Glucose, Bld: 198 mg/dL — ABNORMAL HIGH (ref 70–99)
Potassium: 4.5 mmol/L (ref 3.5–5.1)
Sodium: 140 mmol/L (ref 135–145)
Total Bilirubin: 0.3 mg/dL (ref 0.3–1.2)
Total Protein: 6.3 g/dL — ABNORMAL LOW (ref 6.5–8.1)

## 2021-01-23 LAB — CBC WITH DIFFERENTIAL (CANCER CENTER ONLY)
Abs Immature Granulocytes: 0.26 10*3/uL — ABNORMAL HIGH (ref 0.00–0.07)
Basophils Absolute: 0.1 10*3/uL (ref 0.0–0.1)
Basophils Relative: 1 %
Eosinophils Absolute: 0 10*3/uL (ref 0.0–0.5)
Eosinophils Relative: 0 %
HCT: 40.5 % (ref 39.0–52.0)
Hemoglobin: 12.9 g/dL — ABNORMAL LOW (ref 13.0–17.0)
Immature Granulocytes: 5 %
Lymphocytes Relative: 15 %
Lymphs Abs: 0.8 10*3/uL (ref 0.7–4.0)
MCH: 29.1 pg (ref 26.0–34.0)
MCHC: 31.9 g/dL (ref 30.0–36.0)
MCV: 91.4 fL (ref 80.0–100.0)
Monocytes Absolute: 0.5 10*3/uL (ref 0.1–1.0)
Monocytes Relative: 8 %
Neutro Abs: 4 10*3/uL (ref 1.7–7.7)
Neutrophils Relative %: 71 %
Platelet Count: 73 10*3/uL — ABNORMAL LOW (ref 150–400)
RBC: 4.43 MIL/uL (ref 4.22–5.81)
RDW: 16.1 % — ABNORMAL HIGH (ref 11.5–15.5)
WBC Count: 5.7 10*3/uL (ref 4.0–10.5)
nRBC: 0.4 % — ABNORMAL HIGH (ref 0.0–0.2)

## 2021-01-23 LAB — TSH: TSH: 0.641 u[IU]/mL (ref 0.320–4.118)

## 2021-01-28 ENCOUNTER — Encounter: Payer: Self-pay | Admitting: Primary Care

## 2021-01-28 ENCOUNTER — Ambulatory Visit (HOSPITAL_COMMUNITY)
Admission: RE | Admit: 2021-01-28 | Discharge: 2021-01-28 | Disposition: A | Discharge status: Home / Self Care | Payer: Medicare Other | Source: Ambulatory Visit | Attending: Physician Assistant | Admitting: Physician Assistant

## 2021-01-28 ENCOUNTER — Ambulatory Visit (INDEPENDENT_AMBULATORY_CARE_PROVIDER_SITE_OTHER): Payer: Medicare Other | Admitting: Primary Care

## 2021-01-28 ENCOUNTER — Other Ambulatory Visit: Payer: Self-pay

## 2021-01-28 VITALS — BP 124/76 | HR 106 | Temp 97.6°F | Ht 69.0 in | Wt 281.0 lb

## 2021-01-28 DIAGNOSIS — E119 Type 2 diabetes mellitus without complications: Secondary | ICD-10-CM | POA: Diagnosis not present

## 2021-01-28 DIAGNOSIS — C3411 Malignant neoplasm of upper lobe, right bronchus or lung: Secondary | ICD-10-CM | POA: Insufficient documentation

## 2021-01-28 LAB — POCT GLYCOSYLATED HEMOGLOBIN (HGB A1C): Hemoglobin A1C: 7.5 % — AB (ref 4.0–5.6)

## 2021-01-28 MED ORDER — ONETOUCH ULTRA VI STRP: ORAL_STRIP | 5 refills | Status: DC

## 2021-01-28 MED ORDER — HEPARIN SOD (PORK) LOCK FLUSH 100 UNIT/ML IV SOLN
500.0000 [IU] | Freq: Once | INTRAVENOUS | Status: AC
  Administered 2021-01-28: 500 [IU] via INTRAVENOUS

## 2021-01-28 MED ORDER — IOHEXOL 300 MG/ML  SOLN
100.0000 mL | Freq: Once | INTRAMUSCULAR | Status: AC | PRN
  Administered 2021-01-28: 100 mL via INTRAVENOUS

## 2021-01-28 NOTE — Patient Instructions (Signed)
Continue Basaglar 20 units daily for diabetes. Also continue Metformin, Glipizide, and Actos.  Continue to work on a healthy diet.   Please schedule a follow up appointment in 6 months for diabetes check.   It was a pleasure to see you today!

## 2021-01-28 NOTE — Assessment & Plan Note (Signed)
A1C today of 7.5 which is an improvement from 9.9 three months ago. This is great considering he is undergoing Decadron treatments monthly for cancer regimen.  Continue Basaglar 20 units nightly. Continue metformin 1000 mg BID, glipizide 10 mg BID, Actos 45 mg daily.  He declines foot exam today as he's wearing compression socks that are difficult to remove and add. He will wear cotton socks during his next visit.  He will schedule his eye exam. Pneumonia vaccine UTD. Managed on statin and ACE-I.  Follow up in 6 months or sooner if he sees glucose readings increase consistently.

## 2021-01-28 NOTE — Assessment & Plan Note (Signed)
Treatment resumed due to evidence of liver involvement. Continue cancer treatment.

## 2021-01-28 NOTE — Progress Notes (Signed)
Subjective:    Patient ID: Adam Wise, male    DOB: 06-16-1951, 70 y.o.   MRN: [id number]  HPI  This visit occurred during the SARS-CoV-2 public health emergency.  Safety protocols were in place, including screening questions prior to the visit, additional usage of staff PPE, and extensive cleaning of exam room while observing appropriate contact time as indicated for disinfecting solutions.   Mr. Adinolfi is a 70 year old male with a history of hypertension, OSA, lung cancer, GERD, type 2 diabetes, hypogonadism, chronic back pain, pulmonary embolism, hyperlipidemia who presents today for follow up of diabetes.  Current medications include: Basaglar 20 units daily, Glipizide 10 mg BID, Metformin 1000 mg BID, Actos 45 mg.   He is checking his blood glucose 2 times daily and is getting readings ranging mid 100's to 300's. He is receiving a three day regimen of Decadron once monthly for cancer treatment.   Last A1C: 9.9 in November 2021, 7.5 today Last Eye Exam: Due, he will schedule  Last Foot Exam: Due, he declines today Pneumonia Vaccination: UTD ACE/ARB: Lisinopril  Statin: Crestor  BP Readings from Last 3 Encounters:  01/28/21 124/76  01/11/21 (!) 144/93  01/09/21 (!) 145/87     Review of Systems  Eyes: Negative for visual disturbance.  Respiratory:       No increased shortness of breath  Cardiovascular: Negative for chest pain.       Lower extremity swelling has improved with compression socks.  Neurological: Negative for dizziness.       Past Medical History:  Diagnosis Date  . Cancer ([place])   . Chickenpox   . Chronic knee pain   . Chronic low back pain   . Essential hypertension   . GERD (gastroesophageal reflux disease)   . OSA (obstructive sleep apnea)   . Testosterone deficiency   . Type 2 diabetes mellitus ([place])      Social History   Socioeconomic History  . Marital status: Married    Spouse name: Not on file  . Number of children: Not on file  .  Years of education: Not on file  . Highest education level: Not on file  Occupational History  . Not on file  Tobacco Use  . Smoking status: Former Research scientist (life sciences)  . Smokeless tobacco: Never Used  Vaping Use  . Vaping Use: Never used  Substance and Sexual Activity  . Alcohol use: Yes    Comment: rarely  . Drug use: Not Currently  . Sexual activity: Yes  Other Topics Concern  . Not on file  Social History Narrative   Married.   Moved from [place].   Retired.   Social Determinants of Health   Financial Resource Strain: Not on file  Food Insecurity: Not on file  Transportation Needs: Not on file  Physical Activity: Not on file  Stress: Not on file  Social Connections: Not on file  Intimate Partner Violence: Not on file    Past Surgical History:  Procedure Laterality Date  . COLONOSCOPY WITH PROPOFOL N/A 12/17/2018   Procedure: COLONOSCOPY WITH PROPOFOL;  Surgeon: Virgel Manifold, MD;  Location: [facility] ENDOSCOPY;  Service: Gastroenterology;  Laterality: N/A;  . IR IMAGING GUIDED PORT INSERTION  04/17/2020  . JOINT REPLACEMENT Bilateral   . REPLACEMENT TOTAL KNEE BILATERAL  2015  . TONSILLECTOMY  1960    Family History  Problem Relation Age of Onset  . Cancer Mother   . Hypertension Mother   . Arthritis Father   .  Asthma Father   . Cancer Father   . COPD Father   . Heart attack Father   . Hypertension Sister   . Cancer Sister   . Diabetes Sister   . Asthma Son   . Birth defects Maternal Grandfather   . Arthritis Paternal Grandmother   . Diabetes Paternal Grandmother   . Arthritis Paternal Grandfather   . Asthma Sister   . Cancer Sister   . COPD Sister   . Arthritis Sister   . Asthma Sister   . Diabetes Sister   . Other Neg Hx        pituitary disorder    Allergies  Allergen Reactions  . Bupropion     Racing heart    Current Outpatient Medications on File Prior to Visit  Medication Sig Dispense Refill  . albuterol (VENTOLIN HFA) 108 (90 Base) MCG/ACT inhaler  Inhale 2 puffs into the lungs every 6 (six) hours as needed for wheezing or shortness of breath. 18 g 0  . amLODipine (NORVASC) 10 MG tablet TAKE 1 TABLET BY MOUTH EVERY DAY 90 tablet 1  . Black Pepper-Turmeric (TURMERIC COMPLEX/BLACK PEPPER PO) Take 1 tablet by mouth in the morning and at bedtime.    . blood glucose meter kit and supplies KIT Dispense based on patient and insurance preference. Use up to four times daily as directed. 1 each 0  . chlorpheniramine-HYDROcodone (TUSSIONEX) 10-8 MG/5ML SUER Take 5 mLs by mouth at bedtime as needed for cough. 140 mL 0  . Coenzyme Q10 ([employer] COQ-10) 200 MG capsule Take 200 mg by mouth every evening.     Marland Kitchen ELIQUIS 5 MG TABS tablet TAKE 1 TABLET BY MOUTH TWICE A DAY 180 tablet 2  . esomeprazole (NEXIUM) 20 MG capsule Take 20 mg by mouth daily.     . fluticasone (FLONASE) 50 MCG/ACT nasal spray Place 2 sprays into both nostrils daily as needed for allergies.    Marland Kitchen gabapentin (NEURONTIN) 300 MG capsule Take 300 mg by mouth 2 (two) times daily.     Marland Kitchen glipiZIDE (GLUCOTROL) 10 MG tablet TAKE 1 TABLET (10 MG TOTAL) BY MOUTH 2 (TWO) TIMES DAILY BEFORE A MEAL. FOR DIABETES. 180 tablet 2  . Insulin Glargine (BASAGLAR KWIKPEN) 100 UNIT/ML Inject 20 Units into the skin at bedtime. For diabetes. 15 mL 3  . Insulin Pen Needle (BD PEN NEEDLE NANO U/F) 32G X 4 MM MISC Use with insulin as prescribed  Dx Code: E11.9 100 each 2  . KRILL OIL PO Take 350 mg by mouth daily.    . Lancets MISC USE UP TO 3 TIMES DAILY AS DIRECTED 100 each 2  . lidocaine-prilocaine (EMLA) cream Apply 1 application topically as needed. 30 g 2  . lisinopril (ZESTRIL) 5 MG tablet TAKE 1 TABLET BY MOUTH EVERY DAY 90 tablet 1  . Melatonin 10 MG CAPS Take 10 mg by mouth at bedtime.    . metFORMIN (GLUCOPHAGE) 1000 MG tablet TAKE 1 TABLET (1,000 MG TOTAL) BY MOUTH 2 (TWO) TIMES DAILY WITH A MEAL. 180 tablet 0  . Multiple Vitamin (MULTI-VITAMINS) TABS Take 1 tablet by mouth daily.     Marland Kitchen OVER THE COUNTER  MEDICATION Take by mouth.    Marland Kitchen oxymetazoline (AFRIN) 0.05 % nasal spray Place 1 spray into both nostrils 2 (two) times daily as needed for congestion.    . pioglitazone (ACTOS) 45 MG tablet Take 1 tablet (45 mg total) by mouth daily. For diabetes. 90 tablet 2  . pravastatin (  PRAVACHOL) 40 MG tablet TAKE 1 TABLET BY MOUTH EVERY DAY IN THE EVENING FOR CHOLESTEROL 90 tablet 1  . prochlorperazine (COMPAZINE) 10 MG tablet Take 1 tablet (10 mg total) by mouth every 6 (six) hours as needed for nausea or vomiting. 30 tablet 0  . sildenafil (REVATIO) 20 MG tablet Take 3-5 tablets by mouth 1 hour prior to intercourse as needed. 50 tablet 0  . SYRINGE-NEEDLE, DISP, 3 ML (B-D 3CC LUER-LOK SYR 22GX1") 22G X 1" 3 ML MISC USE AS INSTRUCTED FOR TESTOSTERONE INJECTION EVERY 2 WEEKS 10 each 2  . testosterone cypionate (DEPOTESTOSTERONE CYPIONATE) 200 MG/ML injection Inject 200 mg into the muscle every 14 (fourteen) days.     No current facility-administered medications on file prior to visit.    BP 124/76   Pulse (!) 106   Temp 97.6 F (36.4 C) (Temporal)   Ht $R'5\' 9"'Cx$  (1.753 m)   Wt 281 lb (127.5 kg)   SpO2 97%   BMI 41.50 kg/m    Objective:   Physical Exam Constitutional:      Appearance: He is well-nourished.  Cardiovascular:     Rate and Rhythm: Normal rate and regular rhythm.  Pulmonary:     Effort: Pulmonary effort is normal.     Breath sounds: Normal breath sounds.  Musculoskeletal:     Cervical back: Neck supple.  Skin:    General: Skin is warm and dry.  Psychiatric:        Mood and Affect: Mood and affect normal.            Assessment & Plan:

## 2021-01-30 ENCOUNTER — Other Ambulatory Visit: Payer: Self-pay

## 2021-01-30 ENCOUNTER — Encounter: Payer: Self-pay | Admitting: Internal Medicine

## 2021-01-30 ENCOUNTER — Inpatient Hospital Stay (HOSPITAL_BASED_OUTPATIENT_CLINIC_OR_DEPARTMENT_OTHER): Payer: Medicare Other | Admitting: Internal Medicine

## 2021-01-30 ENCOUNTER — Inpatient Hospital Stay: Payer: Medicare Other

## 2021-01-30 VITALS — BP 131/84 | HR 101 | Temp 97.6°F | Resp 16 | Ht 69.0 in | Wt 282.7 lb

## 2021-01-30 VITALS — HR 99

## 2021-01-30 DIAGNOSIS — J90 Pleural effusion, not elsewhere classified: Secondary | ICD-10-CM | POA: Diagnosis not present

## 2021-01-30 DIAGNOSIS — C3411 Malignant neoplasm of upper lobe, right bronchus or lung: Secondary | ICD-10-CM

## 2021-01-30 DIAGNOSIS — Z79899 Other long term (current) drug therapy: Secondary | ICD-10-CM | POA: Diagnosis not present

## 2021-01-30 DIAGNOSIS — Z5112 Encounter for antineoplastic immunotherapy: Secondary | ICD-10-CM

## 2021-01-30 DIAGNOSIS — Z95828 Presence of other vascular implants and grafts: Secondary | ICD-10-CM

## 2021-01-30 DIAGNOSIS — Z5111 Encounter for antineoplastic chemotherapy: Secondary | ICD-10-CM | POA: Diagnosis not present

## 2021-01-30 DIAGNOSIS — C787 Secondary malignant neoplasm of liver and intrahepatic bile duct: Secondary | ICD-10-CM | POA: Diagnosis not present

## 2021-01-30 LAB — CBC WITH DIFFERENTIAL (CANCER CENTER ONLY)
Abs Immature Granulocytes: 0.05 10*3/uL (ref 0.00–0.07)
Basophils Absolute: 0.1 10*3/uL (ref 0.0–0.1)
Basophils Relative: 1 %
Eosinophils Absolute: 0.1 10*3/uL (ref 0.0–0.5)
Eosinophils Relative: 1 %
HCT: 37.7 % — ABNORMAL LOW (ref 39.0–52.0)
Hemoglobin: 12.1 g/dL — ABNORMAL LOW (ref 13.0–17.0)
Immature Granulocytes: 1 %
Lymphocytes Relative: 14 %
Lymphs Abs: 0.7 10*3/uL (ref 0.7–4.0)
MCH: 29.3 pg (ref 26.0–34.0)
MCHC: 32.1 g/dL (ref 30.0–36.0)
MCV: 91.3 fL (ref 80.0–100.0)
Monocytes Absolute: 0.6 10*3/uL (ref 0.1–1.0)
Monocytes Relative: 12 %
Neutro Abs: 3.5 10*3/uL (ref 1.7–7.7)
Neutrophils Relative %: 71 %
Platelet Count: 220 10*3/uL (ref 150–400)
RBC: 4.13 MIL/uL — ABNORMAL LOW (ref 4.22–5.81)
RDW: 16.7 % — ABNORMAL HIGH (ref 11.5–15.5)
WBC Count: 4.9 10*3/uL (ref 4.0–10.5)
nRBC: 0 % (ref 0.0–0.2)

## 2021-01-30 LAB — CMP (CANCER CENTER ONLY)
ALT: 17 U/L (ref 0–44)
AST: 17 U/L (ref 15–41)
Albumin: 3.8 g/dL (ref 3.5–5.0)
Alkaline Phosphatase: 70 U/L (ref 38–126)
Anion gap: 7 (ref 5–15)
BUN: 16 mg/dL (ref 8–23)
CO2: 25 mmol/L (ref 22–32)
Calcium: 8.4 mg/dL — ABNORMAL LOW (ref 8.9–10.3)
Chloride: 105 mmol/L (ref 98–111)
Creatinine: 1.25 mg/dL — ABNORMAL HIGH (ref 0.61–1.24)
GFR, Estimated: >60 mL/min (ref >60)
Glucose, Bld: 186 mg/dL — ABNORMAL HIGH (ref 70–99)
Potassium: 4.6 mmol/L (ref 3.5–5.1)
Sodium: 137 mmol/L (ref 135–145)
Total Bilirubin: 0.3 mg/dL (ref 0.3–1.2)
Total Protein: 6.2 g/dL — ABNORMAL LOW (ref 6.5–8.1)

## 2021-01-30 MED ORDER — SODIUM CHLORIDE 0.9 % IV SOLN
1200.0000 mg | Freq: Once | INTRAVENOUS | Status: AC
  Administered 2021-01-30: 1200 mg via INTRAVENOUS
  Filled 2021-01-30: qty 20

## 2021-01-30 MED ORDER — HEPARIN SOD (PORK) LOCK FLUSH 100 UNIT/ML IV SOLN
500.0000 [IU] | Freq: Once | INTRAVENOUS | Status: AC | PRN
  Administered 2021-01-30: 500 [IU]
  Filled 2021-01-30: qty 5

## 2021-01-30 MED ORDER — SODIUM CHLORIDE 0.9 % IV SOLN
612.5000 mg | Freq: Once | INTRAVENOUS | Status: AC
  Administered 2021-01-30: 610 mg via INTRAVENOUS
  Filled 2021-01-30: qty 61

## 2021-01-30 MED ORDER — SODIUM CHLORIDE 0.9% FLUSH
10.0000 mL | Freq: Once | INTRAVENOUS | Status: AC
  Administered 2021-01-30: 10 mL
  Filled 2021-01-30: qty 10

## 2021-01-30 MED ORDER — DIPHENHYDRAMINE HCL 50 MG/ML IJ SOLN
25.0000 mg | Freq: Once | INTRAMUSCULAR | Status: AC
  Administered 2021-01-30: 25 mg via INTRAVENOUS

## 2021-01-30 MED ORDER — PALONOSETRON HCL INJECTION 0.25 MG/5ML
INTRAVENOUS | Status: AC
  Filled 2021-01-30: qty 5

## 2021-01-30 MED ORDER — SODIUM CHLORIDE 0.9 % IV SOLN
100.0000 mg/m2 | Freq: Once | INTRAVENOUS | Status: AC
  Administered 2021-01-30: 250 mg via INTRAVENOUS
  Filled 2021-01-30: qty 12.5

## 2021-01-30 MED ORDER — SODIUM CHLORIDE 0.9 % IV SOLN
Freq: Once | INTRAVENOUS | Status: AC
  Filled 2021-01-30: qty 250

## 2021-01-30 MED ORDER — SODIUM CHLORIDE 0.9 % IV SOLN
10.0000 mg | Freq: Once | INTRAVENOUS | Status: AC
  Administered 2021-01-30: 10 mg via INTRAVENOUS
  Filled 2021-01-30: qty 10

## 2021-01-30 MED ORDER — PALONOSETRON HCL INJECTION 0.25 MG/5ML
0.2500 mg | Freq: Once | INTRAVENOUS | Status: AC
  Administered 2021-01-30: 0.25 mg via INTRAVENOUS

## 2021-01-30 MED ORDER — FAMOTIDINE IN NACL 20-0.9 MG/50ML-% IV SOLN
INTRAVENOUS | Status: AC
  Filled 2021-01-30: qty 50

## 2021-01-30 MED ORDER — TRILACICLIB DIHYDROCHLORIDE INJECTION 300 MG
240.0000 mg/m2 | Freq: Once | INTRAVENOUS | Status: AC
  Administered 2021-01-30: 600 mg via INTRAVENOUS
  Filled 2021-01-30: qty 40

## 2021-01-30 MED ORDER — DIPHENHYDRAMINE HCL 50 MG/ML IJ SOLN
INTRAMUSCULAR | Status: AC
  Filled 2021-01-30: qty 1

## 2021-01-30 MED ORDER — FAMOTIDINE IN NACL 20-0.9 MG/50ML-% IV SOLN
20.0000 mg | Freq: Once | INTRAVENOUS | Status: AC
  Administered 2021-01-30: 20 mg via INTRAVENOUS

## 2021-01-30 MED ORDER — SODIUM CHLORIDE 0.9% FLUSH
10.0000 mL | INTRAVENOUS | Status: DC | PRN
  Administered 2021-01-30: 10 mL
  Filled 2021-01-30: qty 10

## 2021-01-30 MED ORDER — SODIUM CHLORIDE 0.9 % IV SOLN
150.0000 mg | Freq: Once | INTRAVENOUS | Status: AC
  Administered 2021-01-30: 150 mg via INTRAVENOUS
  Filled 2021-01-30: qty 150

## 2021-01-30 NOTE — Progress Notes (Signed)
Adam Wise Telephone:(336) [phone]   Fax:(336) [phone]  OFFICE PROGRESS NOTE  [name], NP [place] [address]  DIAGNOSIS: Relapsed extensive stage (T4, N3, M1c)  small cell lung cancer diagnosed in April 2021 and presented with large right upper lobe/right hilar mass with ipsilateral and contralateral mediastinal and right supraclavicular lymphadenopathy in addition to multiple liver lesios. He has disease progression after the first line of chemotherapy in December 2021.  PRIOR THERAPY:  1) Palliative radiotherapy to the right upper lobe lung mass under the care of Dr. Lisbeth Wise. 2) Systemic chemotherapy with carboplatin for AUC of 5 on day 1, etoposide 100 mg/M2 on days 1, 2 and 3 in addition to Imfinzi 1500 mg IV every 3 weeks with chemotherapy then every 4 weeks for maintenance if the patient has no evidence for progression.  He will also receive Cosela 240 mg/m2 on the days of the chemotherapy.  Status post 9 cycles.  Starting from cycle #5 the patient will be on maintenance treatment with immunotherapy with Imfinzi 1500 mg IV every 4 weeks. Last dose of chemotherapy was given on November 13, 2020. This treatment was discontinued secondary to disease progression.  CURRENT THERAPY: Systemic chemotherapy with carboplatin for AUC of 5 on day 1, etoposide 100 mg/M2 on days 1, 2 and 3, Tecentriq 1200 mg IV every 3 weeks as well as Cosela 250 mg/M2 on the days of the chemotherapy every 3 weeks.  First dose December 18, 2020.  Status post 2 cycles.  INTERVAL HISTORY: Adam Wise 70 y.o. male returns to the clinic today for follow-up visit accompanied by his wife.  The patient is feeling fine today with no concerning complaints except for fatigue and shortness of breath with exertion.  The patient denied having any current chest pain, cough or hemoptysis.  He denied having any fever or chills.  He has no nausea, vomiting, diarrhea or constipation.  He  denied having any headache or visual changes.  He has no recent weight loss or night sweats.  He continues to tolerate his treatment fairly well.  The patient had repeat CT scan of the chest, abdomen pelvis performed recently and he is here for evaluation and discussion of his scan results.  MEDICAL HISTORY: Past Medical History:  Diagnosis Date  . Cancer ([place])   . Chickenpox   . Chronic knee pain   . Chronic low back pain   . Essential hypertension   . GERD (gastroesophageal reflux disease)   . OSA (obstructive sleep apnea)   . Testosterone deficiency   . Type 2 diabetes mellitus ([facility])     ALLERGIES:  is allergic to bupropion.  MEDICATIONS:  Current Outpatient Medications  Medication Sig Dispense Refill  . albuterol (VENTOLIN HFA) 108 (90 Base) MCG/ACT inhaler Inhale 2 puffs into the lungs every 6 (six) hours as needed for wheezing or shortness of breath. 18 g 0  . amLODipine (NORVASC) 10 MG tablet TAKE 1 TABLET BY MOUTH EVERY DAY 90 tablet 1  . Black Pepper-Turmeric (TURMERIC COMPLEX/BLACK PEPPER PO) Take 1 tablet by mouth in the morning and at bedtime.    . blood glucose meter kit and supplies KIT Dispense based on patient and insurance preference. Use up to four times daily as directed. 1 each 0  . chlorpheniramine-HYDROcodone (TUSSIONEX) 10-8 MG/5ML SUER Take 5 mLs by mouth at bedtime as needed for cough. 140 mL 0  . Coenzyme Q10 ([employer] COQ-10) 200 MG capsule  Take 200 mg by mouth every evening.     Marland Kitchen ELIQUIS 5 MG TABS tablet TAKE 1 TABLET BY MOUTH TWICE A DAY 180 tablet 2  . esomeprazole (NEXIUM) 20 MG capsule Take 20 mg by mouth daily.     . fluticasone (FLONASE) 50 MCG/ACT nasal spray Place 2 sprays into both nostrils daily as needed for allergies.    Marland Kitchen gabapentin (NEURONTIN) 300 MG capsule Take 300 mg by mouth 2 (two) times daily.     Marland Kitchen glipiZIDE (GLUCOTROL) 10 MG tablet TAKE 1 TABLET (10 MG TOTAL) BY MOUTH 2 (TWO) TIMES DAILY BEFORE A MEAL. FOR DIABETES. 180 tablet 2  . glucose  blood (ONETOUCH ULTRA) test strip USE UP TO 4 TIMES DAILY AS DIRECTED 100 strip 5  . Insulin Glargine (BASAGLAR KWIKPEN) 100 UNIT/ML Inject 20 Units into the skin at bedtime. For diabetes. 15 mL 3  . Insulin Pen Needle (BD PEN NEEDLE NANO U/F) 32G X 4 MM MISC Use with insulin as prescribed  Dx Code: E11.9 100 each 2  . KRILL OIL PO Take 350 mg by mouth daily.    . Lancets MISC USE UP TO 3 TIMES DAILY AS DIRECTED 100 each 2  . lidocaine-prilocaine (EMLA) cream Apply 1 application topically as needed. 30 g 2  . lisinopril (ZESTRIL) 5 MG tablet TAKE 1 TABLET BY MOUTH EVERY DAY 90 tablet 1  . Melatonin 10 MG CAPS Take 10 mg by mouth at bedtime.    . metFORMIN (GLUCOPHAGE) 1000 MG tablet TAKE 1 TABLET (1,000 MG TOTAL) BY MOUTH 2 (TWO) TIMES DAILY WITH A MEAL. 180 tablet 0  . Multiple Vitamin (MULTI-VITAMINS) TABS Take 1 tablet by mouth daily.     Marland Kitchen OVER THE COUNTER MEDICATION Take by mouth.    Marland Kitchen oxymetazoline (AFRIN) 0.05 % nasal spray Place 1 spray into both nostrils 2 (two) times daily as needed for congestion.    . pioglitazone (ACTOS) 45 MG tablet Take 1 tablet (45 mg total) by mouth daily. For diabetes. 90 tablet 2  . pravastatin (PRAVACHOL) 40 MG tablet TAKE 1 TABLET BY MOUTH EVERY DAY IN THE EVENING FOR CHOLESTEROL 90 tablet 1  . prochlorperazine (COMPAZINE) 10 MG tablet Take 1 tablet (10 mg total) by mouth every 6 (six) hours as needed for nausea or vomiting. 30 tablet 0  . sildenafil (REVATIO) 20 MG tablet Take 3-5 tablets by mouth 1 hour prior to intercourse as needed. 50 tablet 0  . SYRINGE-NEEDLE, DISP, 3 ML (B-D 3CC LUER-LOK SYR 22GX1") 22G X 1" 3 ML MISC USE AS INSTRUCTED FOR TESTOSTERONE INJECTION EVERY 2 WEEKS 10 each 2  . testosterone cypionate (DEPOTESTOSTERONE CYPIONATE) 200 MG/ML injection Inject 200 mg into the muscle every 14 (fourteen) days.     No current facility-administered medications for this visit.    SURGICAL HISTORY:  Past Surgical History:  Procedure Laterality  Date  . COLONOSCOPY WITH PROPOFOL N/A 12/17/2018   Procedure: COLONOSCOPY WITH PROPOFOL;  Surgeon: Virgel Manifold, MD;  Location: [facility] ENDOSCOPY;  Service: Gastroenterology;  Laterality: N/A;  . IR IMAGING GUIDED PORT INSERTION  04/17/2020  . JOINT REPLACEMENT Bilateral   . REPLACEMENT TOTAL KNEE BILATERAL  2015  . TONSILLECTOMY  1960    REVIEW OF SYSTEMS:  Constitutional: positive for fatigue Eyes: negative Ears, nose, mouth, throat, and face: negative Respiratory: positive for dyspnea on exertion Cardiovascular: negative Gastrointestinal: negative Genitourinary:negative Integument/breast: negative Hematologic/lymphatic: negative Musculoskeletal:negative Neurological: negative Behavioral/Psych: negative Endocrine: negative Allergic/Immunologic: negative   PHYSICAL EXAMINATION: General  appearance: alert, cooperative, fatigued and no distress Head: atraumatic Neck: no adenopathy, no JVD, supple, symmetrical, trachea midline and thyroid not enlarged, symmetric, no tenderness/mass/nodules Lymph nodes: Cervical, supraclavicular, and axillary nodes normal. Resp: clear to auscultation bilaterally Back: symmetric, no curvature. ROM normal. No CVA tenderness. Cardio: regular rate and rhythm, S1, S2 normal, no murmur, click, rub or gallop GI: soft, non-tender; bowel sounds normal; no masses,  no organomegaly Extremities: extremities normal, atraumatic, no cyanosis or edema Neurologic: Alert and oriented X 3, normal strength and tone. Normal symmetric reflexes. Normal coordination and gait  ECOG PERFORMANCE STATUS: 1 - Symptomatic but completely ambulatory  Blood pressure 131/84, pulse (!) 101, temperature 97.6 F (36.4 C), temperature source Tympanic, resp. rate 16, height 5\' 9"  (1.753 m), weight 282 lb 11.2 oz (128.2 kg), SpO2 100 %.  LABORATORY DATA: Lab Results  Component Value Date   WBC 4.9 01/30/2021   HGB 12.1 (L) 01/30/2021   HCT 37.7 (L) 01/30/2021   MCV 91.3  01/30/2021   PLT 220 01/30/2021      Chemistry      Component Value Date/Time   NA 137 01/30/2021 1035   K 4.6 01/30/2021 1035   CL 105 01/30/2021 1035   CO2 25 01/30/2021 1035   BUN 16 01/30/2021 1035   CREATININE 1.25 (H) 01/30/2021 1035      Component Value Date/Time   CALCIUM 8.4 (L) 01/30/2021 1035   ALKPHOS 70 01/30/2021 1035   AST 17 01/30/2021 1035   ALT 17 01/30/2021 1035   BILITOT 0.3 01/30/2021 1035       RADIOGRAPHIC STUDIES: CT Chest W Contrast  Result Date: 01/29/2021 CLINICAL DATA:  History of right-sided small cell lung cancer diagnosed in 2021, chemo and radiation complete. Currently undergoing immunotherapy. Patient complains chronic shortness of breath of recent blood clot. EXAM: CT CHEST, ABDOMEN, AND PELVIS WITH CONTRAST TECHNIQUE: Multidetector CT imaging of the chest, abdomen and pelvis was performed following the standard protocol during bolus administration of intravenous contrast. CONTRAST:  2022 OMNIPAQUE IOHEXOL 300 MG/ML  SOLN COMPARISON:  Multiple priors including most recent CT chest abdomen and pelvis December 10, 2020 FINDINGS: CT CHEST FINDINGS Cardiovascular: Unchanged position of the accessed right chest Port-A-Cath. Normal size heart. No pericardial effusion. No evidence of thoracic aortic aneurysm. Aortic atherosclerosis. Coronary artery calcifications. Mediastinum/Nodes: Subcarinal lymph node now measures 9 mm previously 10 mm (series 2, image 28). Low-density soft tissue in the low right paratracheal region again favored represent fluid (series 2, image 24). The bilobed fluid in the right epicardial region is unchanged from prior (series 2, image 37). The visualized thyroid is unremarkable. Trachea and esophagus are grossly unremarkable. Lungs/Pleura: Post treatment/radiation changes medial right upper lobe and central right lower lobe/perihilar region appear similar to prior. Unchanged size of the moderate right pleural effusion. The left lung is  clear. No left pleural effusion. No pneumothorax. Musculoskeletal: Thoracic spondylosis. No suspicious lytic or blastic lesion of bone. CT ABDOMEN PELVIS FINDINGS Hepatobiliary: Decreased size of the bilobar hepatic metastasis. Index lesions are as follows: -segment IVA lesion now measures 1.3 cm previously 3.1 cm (series 2, image 50) -segment III lesion now measures 1.5 cm previously 3.2 cm (series 2, image 57) -caudate lesion now measures 1.5 cm previously 3.3 cm (series 2, image 57). -segment V lesion now measures 1.4 cm previously 2.8 cm (series 2, image 58). Gallbladder is unremarkable.  No biliary ductal dilatation. Pancreas: Unremarkable Spleen: Unremarkable Adrenals/Urinary Tract: Unchanged size of the 1.7 cm right adrenal lesion  previously characterized as a benign adrenal adenoma on MRI abdomen April 11, 2020 (series 2, image 60) left adrenal gland is unremarkable. Multiple right renal cysts appear grossly unchanged measuring up to 6.7 cm (series 2, image 75). No hydronephrosis. Urinary bladder appears grossly unremarkable for degree of distension. Stomach/Bowel: Unremarkable decompressed appearance of the stomach. Duodenal diverticulum. No suspicious small bowel wall thickening or dilation. The appendix and terminal ileum appear within normal limits. No suspicious colonic wall thickening or mass like lesions. Scattered colonic diverticulosis without findings of acute diverticulitis. Vascular/Lymphatic: Aortic atherosclerosis. No enlarged abdominal or pelvic lymph nodes. Reproductive: Mild prostatomegaly. Other: No abdominopelvic ascites. Musculoskeletal: Multilevel degenerative changes spine. No suspicious lytic or blastic lesion of bone IMPRESSION: 1. Interval decreased bilobar hepatic metastasis. 2. Similar post treatment/radiation change within the right lung, with no significant change in size of the moderate right pleural effusion. 3. Unchanged size of the right adrenal adenoma. 4. Aortic  atherosclerosis. Aortic Atherosclerosis (ICD10-I70.0). Electronically Signed   By: Dahlia Bailiff MD   On: 01/29/2021 08:31   CT Abdomen Pelvis W Contrast  Result Date: 01/29/2021 CLINICAL DATA:  History of right-sided small cell lung cancer diagnosed in 2021, chemo and radiation complete. Currently undergoing immunotherapy. Patient complains chronic shortness of breath of recent blood clot. EXAM: CT CHEST, ABDOMEN, AND PELVIS WITH CONTRAST TECHNIQUE: Multidetector CT imaging of the chest, abdomen and pelvis was performed following the standard protocol during bolus administration of intravenous contrast. CONTRAST:  122mL OMNIPAQUE IOHEXOL 300 MG/ML  SOLN COMPARISON:  Multiple priors including most recent CT chest abdomen and pelvis December 10, 2020 FINDINGS: CT CHEST FINDINGS Cardiovascular: Unchanged position of the accessed right chest Port-A-Cath. Normal size heart. No pericardial effusion. No evidence of thoracic aortic aneurysm. Aortic atherosclerosis. Coronary artery calcifications. Mediastinum/Nodes: Subcarinal lymph node now measures 9 mm previously 10 mm (series 2, image 28). Low-density soft tissue in the low right paratracheal region again favored represent fluid (series 2, image 24). The bilobed fluid in the right epicardial region is unchanged from prior (series 2, image 37). The visualized thyroid is unremarkable. Trachea and esophagus are grossly unremarkable. Lungs/Pleura: Post treatment/radiation changes medial right upper lobe and central right lower lobe/perihilar region appear similar to prior. Unchanged size of the moderate right pleural effusion. The left lung is clear. No left pleural effusion. No pneumothorax. Musculoskeletal: Thoracic spondylosis. No suspicious lytic or blastic lesion of bone. CT ABDOMEN PELVIS FINDINGS Hepatobiliary: Decreased size of the bilobar hepatic metastasis. Index lesions are as follows: -segment IVA lesion now measures 1.3 cm previously 3.1 cm (series 2,  image 50) -segment III lesion now measures 1.5 cm previously 3.2 cm (series 2, image 57) -caudate lesion now measures 1.5 cm previously 3.3 cm (series 2, image 57). -segment V lesion now measures 1.4 cm previously 2.8 cm (series 2, image 58). Gallbladder is unremarkable.  No biliary ductal dilatation. Pancreas: Unremarkable Spleen: Unremarkable Adrenals/Urinary Tract: Unchanged size of the 1.7 cm right adrenal lesion previously characterized as a benign adrenal adenoma on MRI abdomen April 11, 2020 (series 2, image 60) left adrenal gland is unremarkable. Multiple right renal cysts appear grossly unchanged measuring up to 6.7 cm (series 2, image 75). No hydronephrosis. Urinary bladder appears grossly unremarkable for degree of distension. Stomach/Bowel: Unremarkable decompressed appearance of the stomach. Duodenal diverticulum. No suspicious small bowel wall thickening or dilation. The appendix and terminal ileum appear within normal limits. No suspicious colonic wall thickening or mass like lesions. Scattered colonic diverticulosis without findings of acute diverticulitis. Vascular/Lymphatic:  Aortic atherosclerosis. No enlarged abdominal or pelvic lymph nodes. Reproductive: Mild prostatomegaly. Other: No abdominopelvic ascites. Musculoskeletal: Multilevel degenerative changes spine. No suspicious lytic or blastic lesion of bone IMPRESSION: 1. Interval decreased bilobar hepatic metastasis. 2. Similar post treatment/radiation change within the right lung, with no significant change in size of the moderate right pleural effusion. 3. Unchanged size of the right adrenal adenoma. 4. Aortic atherosclerosis. Aortic Atherosclerosis (ICD10-I70.0). Electronically Signed   By: Dahlia Bailiff MD   On: 01/29/2021 08:31    ASSESSMENT AND PLAN: This is a very pleasant 70 years old white male recently diagnosed with extensive stage (T4, N3, M1 C) small cell lung cancer presented with large right upper lobe lung mass in addition  to mediastinal and right supraclavicular lymphadenopathy and multiple metastatic liver lesions diagnosed in April 2021. The patient underwent systemic chemotherapy with carboplatin for AUC of 5 on day 1, etoposide 100 mg/M2 on days 1, 2 and 3 in addition to Cosela for myeloprotection during the days of the chemotherapy.  He will also receive immunotherapy with Imfinzi on day one of the chemotherapy cycle.  Status post 9 cycles.  Starting from cycle #5 he is on maintenance treatment with Imfinzi 1500 mg IV every 4 weeks. The patient has been tolerating this treatment well with no concerning adverse effect except for the worsening cough and shortness of breath recently.   He had repeat CT scan of the chest, abdomen pelvis performed recently.  I personally and independently reviewed the scan images and discussed the results with the patient and his wife today. Unfortunately his scan showed evidence for disease progression with increase in the right pleural effusion as well as enlargement of multiple liver lesions. I recommended for the patient to discontinue his current maintenance treatment with Imfinzi at this point. He resumed systemic chemotherapy with carboplatin for AUC of 5 on day 1, etoposide 100 mg/M2 on days 1, 2 and 3 with Tecentriq 1200 mg every 3 weeks as well as Cosela on the days of the chemotherapy.  He is status post 2 cycles.  He is tolerating this treatment well with no concerning adverse effects. He had repeat CT scan of the chest, abdomen pelvis performed recently.  I personally and independently reviewed the scan images and discussed the results with the patient and his wife. His scan showed significant improvement of his disease especially in the liver. I recommended for him to proceed with cycle #3 today as planned. I will see him back for follow-up visit in 3 weeks for evaluation before the next cycle of his treatment. For the recurrent right pleural effusion, I will refer the  patient to interventional radiology for consideration of ultrasound-guided right thoracentesis. The patient was advised to call immediately if he has any concerning symptoms in the interval. The patient voices understanding of current disease status and treatment options and is in agreement with the current care plan.  All questions were answered. The patient knows to call the clinic with any problems, questions or concerns. We can certainly see the patient much sooner if necessary.  Disclaimer: This note was dictated with voice recognition software. Similar sounding words can inadvertently be transcribed and may not be corrected upon review.

## 2021-01-30 NOTE — Patient Instructions (Signed)
[facility] Discharge Instructions for Patients Receiving Chemotherapy  Today you received the following chemotherapy agents: Atezolizumab (Tecentriq), Etoposide, and Carboplatin  To help prevent nausea and vomiting after your treatment, we encourage you to take your nausea medication  as prescribed.    If you develop nausea and vomiting that is not controlled by your nausea medication, call the clinic.   BELOW ARE SYMPTOMS THAT SHOULD BE REPORTED IMMEDIATELY:  *FEVER GREATER THAN 100.5 F  *CHILLS WITH OR WITHOUT FEVER  NAUSEA AND VOMITING THAT IS NOT CONTROLLED WITH YOUR NAUSEA MEDICATION  *UNUSUAL SHORTNESS OF BREATH  *UNUSUAL BRUISING OR BLEEDING  TENDERNESS IN MOUTH AND THROAT WITH OR WITHOUT PRESENCE OF ULCERS  *URINARY PROBLEMS  *BOWEL PROBLEMS  UNUSUAL RASH Items with * indicate a potential emergency and should be followed up as soon as possible.  Feel free to call the clinic should you have any questions or concerns. The clinic phone number is [phone].  Please show the [facility] at check-in to the Emergency Department and triage nurse.

## 2021-01-30 NOTE — Progress Notes (Signed)
Per Dr. Julien Nordmann ok to treat with HR of 101-103.

## 2021-01-30 NOTE — Patient Instructions (Signed)
Implanted Port Insertion, Care After This sheet gives you information about how to care for yourself after your procedure. Your health care provider may also give you more specific instructions. If you have problems or questions, contact your health care provider. What can I expect after the procedure? After the procedure, it is common to have:  Discomfort at the port insertion site.  Bruising on the skin over the port. This should improve over 3-4 days. Follow these instructions at home: Port care  After your port is placed, you will get a manufacturer's information card. The card has information about your port. Keep this card with you at all times.  Take care of the port as told by your health care provider. Ask your health care provider if you or a family member can get training for taking care of the port at home. A home health care nurse may also take care of the port.  Make sure to remember what type of port you have. Incision care  Follow instructions from your health care provider about how to take care of your port insertion site. Make sure you: ? Wash your hands with soap and water before and after you change your bandage (dressing). If soap and water are not available, use hand sanitizer. ? Change your dressing as told by your health care provider. ? Leave stitches (sutures), skin glue, or adhesive strips in place. These skin closures may need to stay in place for 2 weeks or longer. If adhesive strip edges start to loosen and curl up, you may trim the loose edges. Do not remove adhesive strips completely unless your health care provider tells you to do that.  Check your port insertion site every day for signs of infection. Check for: ? Redness, swelling, or pain. ? Fluid or blood. ? Warmth. ? Pus or a bad smell.      Activity  Return to your normal activities as told by your health care provider. Ask your health care provider what activities are safe for you.  Do not  lift anything that is heavier than 10 lb (4.5 kg), or the limit that you are told, until your health care provider says that it is safe. General instructions  Take over-the-counter and prescription medicines only as told by your health care provider.  Do not take baths, swim, or use a hot tub until your health care provider approves. Ask your health care provider if you may take showers. You may only be allowed to take sponge baths.  Do not drive for 24 hours if you were given a sedative during your procedure.  Wear a medical alert bracelet in case of an emergency. This will tell any health care providers that you have a port.  Keep all follow-up visits as told by your health care provider. This is important. Contact a health care provider if:  You cannot flush your port with saline as directed, or you cannot draw blood from the port.  You have a fever or chills.  You have redness, swelling, or pain around your port insertion site.  You have fluid or blood coming from your port insertion site.  Your port insertion site feels warm to the touch.  You have pus or a bad smell coming from the port insertion site. Get help right away if:  You have chest pain or shortness of breath.  You have bleeding from your port that you cannot control. Summary  Take care of the port as told by your   health care provider. Keep the manufacturer's information card with you at all times.  Change your dressing as told by your health care provider.  Contact a health care provider if you have a fever or chills or if you have redness, swelling, or pain around your port insertion site.  Keep all follow-up visits as told by your health care provider. This information is not intended to replace advice given to you by your health care provider. Make sure you discuss any questions you have with your health care provider. Document Revised: 06/29/2018 Document Reviewed: 06/29/2018 Elsevier Patient Education   2021 [employer]  

## 2021-01-31 ENCOUNTER — Inpatient Hospital Stay: Payer: Medicare Other

## 2021-01-31 ENCOUNTER — Telehealth: Payer: Self-pay | Admitting: Internal Medicine

## 2021-01-31 VITALS — BP 121/88 | HR 99 | Temp 98.1°F | Resp 16

## 2021-01-31 DIAGNOSIS — C3411 Malignant neoplasm of upper lobe, right bronchus or lung: Secondary | ICD-10-CM | POA: Diagnosis not present

## 2021-01-31 DIAGNOSIS — Z79899 Other long term (current) drug therapy: Secondary | ICD-10-CM | POA: Diagnosis not present

## 2021-01-31 DIAGNOSIS — Z5111 Encounter for antineoplastic chemotherapy: Secondary | ICD-10-CM | POA: Diagnosis not present

## 2021-01-31 DIAGNOSIS — C787 Secondary malignant neoplasm of liver and intrahepatic bile duct: Secondary | ICD-10-CM | POA: Diagnosis not present

## 2021-01-31 DIAGNOSIS — Z5112 Encounter for antineoplastic immunotherapy: Secondary | ICD-10-CM | POA: Diagnosis not present

## 2021-01-31 DIAGNOSIS — J90 Pleural effusion, not elsewhere classified: Secondary | ICD-10-CM | POA: Diagnosis not present

## 2021-01-31 MED ORDER — SODIUM CHLORIDE 0.9 % IV SOLN
100.0000 mg/m2 | Freq: Once | INTRAVENOUS | Status: AC
  Administered 2021-01-31: 250 mg via INTRAVENOUS
  Filled 2021-01-31: qty 12.5

## 2021-01-31 MED ORDER — SODIUM CHLORIDE 0.9 % IV SOLN
Freq: Once | INTRAVENOUS | Status: AC
  Filled 2021-01-31: qty 250

## 2021-01-31 MED ORDER — DEXAMETHASONE SODIUM PHOSPHATE 100 MG/10ML IJ SOLN
10.0000 mg | Freq: Once | INTRAMUSCULAR | Status: AC
  Administered 2021-01-31: 10 mg via INTRAVENOUS
  Filled 2021-01-31: qty 10

## 2021-01-31 MED ORDER — HEPARIN SOD (PORK) LOCK FLUSH 100 UNIT/ML IV SOLN
500.0000 [IU] | Freq: Once | INTRAVENOUS | Status: AC | PRN
  Administered 2021-01-31: 500 [IU]
  Filled 2021-01-31: qty 5

## 2021-01-31 MED ORDER — SODIUM CHLORIDE 0.9% FLUSH
10.0000 mL | INTRAVENOUS | Status: DC | PRN
  Administered 2021-01-31: 10 mL
  Filled 2021-01-31: qty 10

## 2021-01-31 MED ORDER — TRILACICLIB DIHYDROCHLORIDE INJECTION 300 MG
240.0000 mg/m2 | Freq: Once | INTRAVENOUS | Status: AC
  Administered 2021-01-31: 600 mg via INTRAVENOUS
  Filled 2021-01-31: qty 40

## 2021-01-31 NOTE — Telephone Encounter (Signed)
Scheduled appts per 2/16 los and treatment plan. Pt to get updated appt calendar at 2/17 appt per appt notes.

## 2021-01-31 NOTE — Patient Instructions (Signed)
[facility] Discharge Instructions for Patients Receiving Chemotherapy  Today you received the following chemotherapy agents: Cosela, Etoposide  To help prevent nausea and vomiting after your treatment, we encourage you to take your nausea medication as directed.    If you develop nausea and vomiting that is not controlled by your nausea medication, call the clinic.   BELOW ARE SYMPTOMS THAT SHOULD BE REPORTED IMMEDIATELY:  *FEVER GREATER THAN 100.5 F  *CHILLS WITH OR WITHOUT FEVER  NAUSEA AND VOMITING THAT IS NOT CONTROLLED WITH YOUR NAUSEA MEDICATION  *UNUSUAL SHORTNESS OF BREATH  *UNUSUAL BRUISING OR BLEEDING  TENDERNESS IN MOUTH AND THROAT WITH OR WITHOUT PRESENCE OF ULCERS  *URINARY PROBLEMS  *BOWEL PROBLEMS  UNUSUAL RASH Items with * indicate a potential emergency and should be followed up as soon as possible.  Feel free to call the clinic should you have any questions or concerns. The clinic phone number is [phone].  Please show the [facility] at check-in to the Emergency Department and triage nurse.

## 2021-02-01 ENCOUNTER — Other Ambulatory Visit: Payer: Self-pay

## 2021-02-01 ENCOUNTER — Other Ambulatory Visit (HOSPITAL_COMMUNITY)
Admission: RE | Admit: 2021-02-01 | Discharge: 2021-02-01 | Disposition: A | Discharge status: Home / Self Care | Payer: Medicare Other | Source: Ambulatory Visit | Attending: Internal Medicine | Admitting: Internal Medicine

## 2021-02-01 ENCOUNTER — Inpatient Hospital Stay: Payer: Medicare Other

## 2021-02-01 VITALS — BP 128/78 | HR 78 | Temp 98.2°F | Resp 18

## 2021-02-01 DIAGNOSIS — Z20822 Contact with and (suspected) exposure to covid-19: Secondary | ICD-10-CM | POA: Diagnosis not present

## 2021-02-01 DIAGNOSIS — Z79899 Other long term (current) drug therapy: Secondary | ICD-10-CM | POA: Diagnosis not present

## 2021-02-01 DIAGNOSIS — Z01812 Encounter for preprocedural laboratory examination: Secondary | ICD-10-CM | POA: Diagnosis not present

## 2021-02-01 DIAGNOSIS — C3411 Malignant neoplasm of upper lobe, right bronchus or lung: Secondary | ICD-10-CM

## 2021-02-01 DIAGNOSIS — Z5111 Encounter for antineoplastic chemotherapy: Secondary | ICD-10-CM | POA: Diagnosis not present

## 2021-02-01 DIAGNOSIS — C787 Secondary malignant neoplasm of liver and intrahepatic bile duct: Secondary | ICD-10-CM | POA: Diagnosis not present

## 2021-02-01 DIAGNOSIS — Z5112 Encounter for antineoplastic immunotherapy: Secondary | ICD-10-CM | POA: Diagnosis not present

## 2021-02-01 DIAGNOSIS — J90 Pleural effusion, not elsewhere classified: Secondary | ICD-10-CM | POA: Diagnosis not present

## 2021-02-01 MED ORDER — SODIUM CHLORIDE 0.9 % IV SOLN
Freq: Once | INTRAVENOUS | Status: AC
Start: 2021-02-01 — End: 2021-02-01
  Filled 2021-02-01: qty 250

## 2021-02-01 MED ORDER — PEGFILGRASTIM 6 MG/0.6ML ~~LOC~~ PSKT
6.0000 mg | PREFILLED_SYRINGE | Freq: Once | SUBCUTANEOUS | Status: AC
  Administered 2021-02-01: 6 mg via SUBCUTANEOUS

## 2021-02-01 MED ORDER — SODIUM CHLORIDE 0.9 % IV SOLN
10.0000 mg | Freq: Once | INTRAVENOUS | Status: AC
  Administered 2021-02-01: 10 mg via INTRAVENOUS
  Filled 2021-02-01: qty 10

## 2021-02-01 MED ORDER — SODIUM CHLORIDE 0.9 % IV SOLN
100.0000 mg/m2 | Freq: Once | INTRAVENOUS | Status: AC
  Administered 2021-02-01: 250 mg via INTRAVENOUS
  Filled 2021-02-01: qty 12.5

## 2021-02-01 MED ORDER — PEGFILGRASTIM 6 MG/0.6ML ~~LOC~~ PSKT
PREFILLED_SYRINGE | SUBCUTANEOUS | Status: AC
  Filled 2021-02-01: qty 0.6

## 2021-02-01 MED ORDER — TRILACICLIB DIHYDROCHLORIDE INJECTION 300 MG
240.0000 mg/m2 | Freq: Once | INTRAVENOUS | Status: AC
  Administered 2021-02-01: 600 mg via INTRAVENOUS
  Filled 2021-02-01: qty 40

## 2021-02-01 NOTE — Patient Instructions (Signed)
[facility] Discharge Instructions for Patients Receiving Chemotherapy  Today you received the following chemotherapy agents:  Etoposide    To help prevent nausea and vomiting after your treatment, we encourage you to take your nausea medication  as prescribed.    If you develop nausea and vomiting that is not controlled by your nausea medication, call the clinic.   BELOW ARE SYMPTOMS THAT SHOULD BE REPORTED IMMEDIATELY:  *FEVER GREATER THAN 100.5 F  *CHILLS WITH OR WITHOUT FEVER  NAUSEA AND VOMITING THAT IS NOT CONTROLLED WITH YOUR NAUSEA MEDICATION  *UNUSUAL SHORTNESS OF BREATH  *UNUSUAL BRUISING OR BLEEDING  TENDERNESS IN MOUTH AND THROAT WITH OR WITHOUT PRESENCE OF ULCERS  *URINARY PROBLEMS  *BOWEL PROBLEMS  UNUSUAL RASH Items with * indicate a potential emergency and should be followed up as soon as possible.  Feel free to call the clinic should you have any questions or concerns. The clinic phone number is [phone].  Please show the [place] at check-in to the Emergency Department and triage nurse.  Pegfilgrastim injection What is this medicine?  PEGFILGRASTIM (PEG fil gra stim) is a long-acting granulocyte colony-stimulating factor that stimulates the growth of neutrophils, a type of white blood cell important in the body's fight against infection. It is used to reduce the incidence of fever and infection in patients with certain types of cancer who are receiving chemotherapy that affects the bone marrow, and to increase survival after being exposed to high doses of radiation. This medicine may be used for other purposes; ask your health care provider or pharmacist if you have questions. COMMON BRAND NAME(S): Rexene Edison, Ziextenzo What should I tell my health care provider before I take this medicine? They need to know if you have any of these conditions:  kidney disease  latex allergy  ongoing radiation  therapy  sickle cell disease  skin reactions to acrylic adhesives (On-Body Injector only)  an unusual or allergic reaction to pegfilgrastim, filgrastim, other medicines, foods, dyes, or preservatives  pregnant or trying to get pregnant  breast-feeding How should I use this medicine? This medicine is for injection under the skin. If you get this medicine at home, you will be taught how to prepare and give the pre-filled syringe or how to use the On-body Injector. Refer to the patient Instructions for Use for detailed instructions. Use exactly as directed. Tell your healthcare provider immediately if you suspect that the On-body Injector may not have performed as intended or if you suspect the use of the On-body Injector resulted in a missed or partial dose. It is important that you put your used needles and syringes in a special sharps container. Do not put them in a trash can. If you do not have a sharps container, call your pharmacist or healthcare provider to get one. Talk to your pediatrician regarding the use of this medicine in children. While this drug may be prescribed for selected conditions, precautions do apply. Overdosage: If you think you have taken too much of this medicine contact a poison control center or emergency room at once. NOTE: This medicine is only for you. Do not share this medicine with others. What if I miss a dose? It is important not to miss your dose. Call your doctor or health care professional if you miss your dose. If you miss a dose due to an On-body Injector failure or leakage, a new dose should be administered as soon as possible using a single prefilled  syringe for manual use. What may interact with this medicine? Interactions have not been studied. This list may not describe all possible interactions. Give your health care provider a list of all the medicines, herbs, non-prescription drugs, or dietary supplements you use. Also tell them if you smoke, drink  alcohol, or use illegal drugs. Some items may interact with your medicine. What should I watch for while using this medicine? Your condition will be monitored carefully while you are receiving this medicine. You may need blood work done while you are taking this medicine. Talk to your health care provider about your risk of cancer. You may be more at risk for certain types of cancer if you take this medicine. If you are going to need a MRI, CT scan, or other procedure, tell your doctor that you are using this medicine (On-Body Injector only). What side effects may I notice from receiving this medicine? Side effects that you should report to your doctor or health care professional as soon as possible:  allergic reactions (skin rash, itching or hives, swelling of the face, lips, or tongue)  back pain  dizziness  fever  pain, redness, or irritation at site where injected  pinpoint red spots on the skin  red or dark-brown urine  shortness of breath or breathing problems  stomach or side pain, or pain at the shoulder  swelling  tiredness  trouble passing urine or change in the amount of urine  unusual bruising or bleeding Side effects that usually do not require medical attention (report to your doctor or health care professional if they continue or are bothersome):  bone pain  muscle pain This list may not describe all possible side effects. Call your doctor for medical advice about side effects. You may report side effects to FDA at 1-800-FDA-1088. Where should I keep my medicine? Keep out of the reach of children. If you are using this medicine at home, you will be instructed on how to store it. Throw away any unused medicine after the expiration date on the label. NOTE: This sheet is a summary. It may not cover all possible information. If you have questions about this medicine, talk to your doctor, pharmacist, or health care provider.  2021 Elsevier/Gold Standard  (2019-12-23 13:20:51)

## 2021-02-01 NOTE — Progress Notes (Signed)
Pt discharged in no apparent distress. Pt left ambulatory without assistance. Pt aware of discharge instructions and verbalized understanding and had no further questions.  

## 2021-02-02 LAB — SARS CORONAVIRUS 2 (TAT 6-24 HRS): SARS Coronavirus 2: NEGATIVE

## 2021-02-04 ENCOUNTER — Ambulatory Visit (HOSPITAL_COMMUNITY)
Admission: RE | Admit: 2021-02-04 | Discharge: 2021-02-04 | Disposition: A | Discharge status: Home / Self Care | Payer: Medicare Other | Source: Ambulatory Visit | Attending: Internal Medicine | Admitting: Internal Medicine

## 2021-02-04 ENCOUNTER — Other Ambulatory Visit: Payer: Self-pay

## 2021-02-04 ENCOUNTER — Ambulatory Visit (HOSPITAL_COMMUNITY)
Admission: RE | Admit: 2021-02-04 | Discharge: 2021-02-04 | Disposition: A | Discharge status: Home / Self Care | Payer: Medicare Other | Source: Ambulatory Visit | Attending: Radiology | Admitting: Radiology

## 2021-02-04 DIAGNOSIS — J90 Pleural effusion, not elsewhere classified: Secondary | ICD-10-CM | POA: Insufficient documentation

## 2021-02-04 DIAGNOSIS — Z9889 Other specified postprocedural states: Secondary | ICD-10-CM

## 2021-02-04 DIAGNOSIS — C3411 Malignant neoplasm of upper lobe, right bronchus or lung: Secondary | ICD-10-CM | POA: Insufficient documentation

## 2021-02-04 MED ORDER — LIDOCAINE HCL 1 % IJ SOLN
INTRAMUSCULAR | Status: AC
  Filled 2021-02-04: qty 20

## 2021-02-04 NOTE — Procedures (Signed)
Ultrasound-guided  therapeutic right thoracentesis performed yielding 475 cc of amber fluid. No immediate complications. Follow-up chest x-ray pending.EBL< 1 cc.

## 2021-02-06 ENCOUNTER — Other Ambulatory Visit: Payer: Self-pay

## 2021-02-06 ENCOUNTER — Inpatient Hospital Stay: Payer: Medicare Other

## 2021-02-06 DIAGNOSIS — J90 Pleural effusion, not elsewhere classified: Secondary | ICD-10-CM | POA: Diagnosis not present

## 2021-02-06 DIAGNOSIS — Z5112 Encounter for antineoplastic immunotherapy: Secondary | ICD-10-CM | POA: Diagnosis not present

## 2021-02-06 DIAGNOSIS — C3411 Malignant neoplasm of upper lobe, right bronchus or lung: Secondary | ICD-10-CM | POA: Diagnosis not present

## 2021-02-06 DIAGNOSIS — Z5111 Encounter for antineoplastic chemotherapy: Secondary | ICD-10-CM | POA: Diagnosis not present

## 2021-02-06 DIAGNOSIS — C787 Secondary malignant neoplasm of liver and intrahepatic bile duct: Secondary | ICD-10-CM | POA: Diagnosis not present

## 2021-02-06 DIAGNOSIS — Z79899 Other long term (current) drug therapy: Secondary | ICD-10-CM | POA: Diagnosis not present

## 2021-02-06 LAB — CMP (CANCER CENTER ONLY)
ALT: 14 U/L (ref 0–44)
AST: 14 U/L — ABNORMAL LOW (ref 15–41)
Albumin: 3.8 g/dL (ref 3.5–5.0)
Alkaline Phosphatase: 109 U/L (ref 38–126)
Anion gap: 7 (ref 5–15)
BUN: 21 mg/dL (ref 8–23)
CO2: 28 mmol/L (ref 22–32)
Calcium: 9.1 mg/dL (ref 8.9–10.3)
Chloride: 102 mmol/L (ref 98–111)
Creatinine: 1.27 mg/dL — ABNORMAL HIGH (ref 0.61–1.24)
GFR, Estimated: >60 mL/min (ref >60)
Glucose, Bld: 242 mg/dL — ABNORMAL HIGH (ref 70–99)
Potassium: 5.3 mmol/L — ABNORMAL HIGH (ref 3.5–5.1)
Sodium: 137 mmol/L (ref 135–145)
Total Bilirubin: 0.3 mg/dL (ref 0.3–1.2)
Total Protein: 6.2 g/dL — ABNORMAL LOW (ref 6.5–8.1)

## 2021-02-06 LAB — CBC WITH DIFFERENTIAL (CANCER CENTER ONLY)
Abs Immature Granulocytes: 0 10*3/uL (ref 0.00–0.07)
Band Neutrophils: 3 %
Basophils Absolute: 0 10*3/uL (ref 0.0–0.1)
Basophils Relative: 0 %
Eosinophils Absolute: 0.1 10*3/uL (ref 0.0–0.5)
Eosinophils Relative: 1 %
HCT: 37.6 % — ABNORMAL LOW (ref 39.0–52.0)
Hemoglobin: 12 g/dL — ABNORMAL LOW (ref 13.0–17.0)
Lymphocytes Relative: 7 %
Lymphs Abs: 0.7 10*3/uL (ref 0.7–4.0)
MCH: 29.4 pg (ref 26.0–34.0)
MCHC: 31.9 g/dL (ref 30.0–36.0)
MCV: 92.2 fL (ref 80.0–100.0)
Monocytes Absolute: 0.4 10*3/uL (ref 0.1–1.0)
Monocytes Relative: 4 %
Neutro Abs: 9.2 10*3/uL — ABNORMAL HIGH (ref 1.7–7.7)
Neutrophils Relative %: 85 %
Platelet Count: 178 10*3/uL (ref 150–400)
RBC: 4.08 MIL/uL — ABNORMAL LOW (ref 4.22–5.81)
RDW: 16.5 % — ABNORMAL HIGH (ref 11.5–15.5)
WBC Count: 10.5 10*3/uL (ref 4.0–10.5)
nRBC: 0 % (ref 0.0–0.2)

## 2021-02-13 ENCOUNTER — Other Ambulatory Visit: Payer: Self-pay

## 2021-02-13 ENCOUNTER — Ambulatory Visit (INDEPENDENT_AMBULATORY_CARE_PROVIDER_SITE_OTHER): Payer: Medicare Other

## 2021-02-13 ENCOUNTER — Inpatient Hospital Stay: Payer: Medicare Other | Attending: Internal Medicine

## 2021-02-13 DIAGNOSIS — C3411 Malignant neoplasm of upper lobe, right bronchus or lung: Secondary | ICD-10-CM | POA: Diagnosis not present

## 2021-02-13 DIAGNOSIS — R5383 Other fatigue: Secondary | ICD-10-CM | POA: Diagnosis not present

## 2021-02-13 DIAGNOSIS — Z0000 Encounter for general adult medical examination without abnormal findings: Secondary | ICD-10-CM | POA: Diagnosis not present

## 2021-02-13 DIAGNOSIS — Z5112 Encounter for antineoplastic immunotherapy: Secondary | ICD-10-CM | POA: Diagnosis not present

## 2021-02-13 DIAGNOSIS — Z5111 Encounter for antineoplastic chemotherapy: Secondary | ICD-10-CM | POA: Insufficient documentation

## 2021-02-13 DIAGNOSIS — R5382 Chronic fatigue, unspecified: Secondary | ICD-10-CM

## 2021-02-13 DIAGNOSIS — Z5189 Encounter for other specified aftercare: Secondary | ICD-10-CM | POA: Diagnosis not present

## 2021-02-13 DIAGNOSIS — Z79899 Other long term (current) drug therapy: Secondary | ICD-10-CM | POA: Insufficient documentation

## 2021-02-13 LAB — CBC WITH DIFFERENTIAL (CANCER CENTER ONLY)
Abs Immature Granulocytes: 0.21 10*3/uL — ABNORMAL HIGH (ref 0.00–0.07)
Basophils Absolute: 0.1 10*3/uL (ref 0.0–0.1)
Basophils Relative: 1 %
Eosinophils Absolute: 0.1 10*3/uL (ref 0.0–0.5)
Eosinophils Relative: 1 %
HCT: 39.6 % (ref 39.0–52.0)
Hemoglobin: 12.4 g/dL — ABNORMAL LOW (ref 13.0–17.0)
Immature Granulocytes: 2 %
Lymphocytes Relative: 9 %
Lymphs Abs: 0.8 10*3/uL (ref 0.7–4.0)
MCH: 29.4 pg (ref 26.0–34.0)
MCHC: 31.3 g/dL (ref 30.0–36.0)
MCV: 93.8 fL (ref 80.0–100.0)
Monocytes Absolute: 0.6 10*3/uL (ref 0.1–1.0)
Monocytes Relative: 6 %
Neutro Abs: 7.2 10*3/uL (ref 1.7–7.7)
Neutrophils Relative %: 81 %
Platelet Count: 64 10*3/uL — ABNORMAL LOW (ref 150–400)
RBC: 4.22 MIL/uL (ref 4.22–5.81)
RDW: 18.2 % — ABNORMAL HIGH (ref 11.5–15.5)
WBC Count: 9 10*3/uL (ref 4.0–10.5)
nRBC: 0.3 % — ABNORMAL HIGH (ref 0.0–0.2)

## 2021-02-13 LAB — TSH: TSH: 0.594 u[IU]/mL (ref 0.320–4.118)

## 2021-02-13 LAB — CMP (CANCER CENTER ONLY)
ALT: 20 U/L (ref 0–44)
AST: 18 U/L (ref 15–41)
Albumin: 4 g/dL (ref 3.5–5.0)
Alkaline Phosphatase: 105 U/L (ref 38–126)
Anion gap: 11 (ref 5–15)
BUN: 12 mg/dL (ref 8–23)
CO2: 27 mmol/L (ref 22–32)
Calcium: 9.2 mg/dL (ref 8.9–10.3)
Chloride: 105 mmol/L (ref 98–111)
Creatinine: 1.31 mg/dL — ABNORMAL HIGH (ref 0.61–1.24)
GFR, Estimated: 59 mL/min — ABNORMAL LOW (ref >60)
Glucose, Bld: 182 mg/dL — ABNORMAL HIGH (ref 70–99)
Potassium: 4.6 mmol/L (ref 3.5–5.1)
Sodium: 143 mmol/L (ref 135–145)
Total Bilirubin: 0.3 mg/dL (ref 0.3–1.2)
Total Protein: 6.5 g/dL (ref 6.5–8.1)

## 2021-02-13 NOTE — Progress Notes (Signed)
Subjective:   Adam Wise is a 70 y.o. male who presents for Medicare Annual/Subsequent preventive examination.  Review of Systems: N/A      I connected with the patient today by telephone and verified that I am speaking with the correct person using two identifiers. Location patient: home Location nurse: work Persons participating in the telephone visit: patient, nurse.   I discussed the limitations, risks, security and privacy concerns of performing an evaluation and management service by telephone and the availability of in person appointments. I also discussed with the patient that there may be a patient responsible charge related to this service. The patient expressed understanding and verbally consented to this telephonic visit.        Cardiac Risk Factors include: advanced age (>20men, >3 women);male gender;diabetes mellitus;hypertension;Other (see comment), Risk factor comments: hyperlipidemia     Objective:    Today's Vitals   02/13/21 1522  PainSc: 0-No pain   There is no height or weight on file to calculate BMI.  Advanced Directives 02/13/2021 02/01/2021 01/10/2021 01/09/2021 12/21/2020 10/16/2020 10/09/2020  Does Patient Have a Medical Advance Directive? Yes Yes No Yes Yes Yes Yes  Type of Paramedic of [place];Living will - [place];Living will [place];Living will [place];Living will Healthcare Power of [place]  Does patient want to make changes to medical advance directive? - - No - Patient declined No - Patient declined No - Patient declined No - Patient declined No - Patient declined  Copy of [place] in Chart? Yes - validated most recent copy scanned in chart (See row information) No - copy requested Yes - validated most recent copy scanned in chart (See row information) Yes - validated most recent copy scanned in chart (See row  information) Yes - validated most recent copy scanned in chart (See row information) Yes - validated most recent copy scanned in chart (See row information) (No Data)  Would patient like information on creating a medical advance directive? - - - - No - Patient declined - -    Current Medications (verified) Outpatient Encounter Medications as of 02/13/2021  Medication Sig  . albuterol (VENTOLIN HFA) 108 (90 Base) MCG/ACT inhaler Inhale 2 puffs into the lungs every 6 (six) hours as needed for wheezing or shortness of breath.  Marland Kitchen amLODipine (NORVASC) 10 MG tablet TAKE 1 TABLET BY MOUTH EVERY DAY  . Black Pepper-Turmeric (TURMERIC COMPLEX/BLACK PEPPER PO) Take 1 tablet by mouth in the morning and at bedtime.  . blood glucose meter kit and supplies KIT Dispense based on patient and insurance preference. Use up to four times daily as directed.  . chlorpheniramine-HYDROcodone (TUSSIONEX) 10-8 MG/5ML SUER Take 5 mLs by mouth at bedtime as needed for cough.  . Coenzyme Q10 ([employer] COQ-10) 200 MG capsule Take 200 mg by mouth every evening.   Marland Kitchen ELIQUIS 5 MG TABS tablet TAKE 1 TABLET BY MOUTH TWICE A DAY  . esomeprazole (NEXIUM) 20 MG capsule Take 20 mg by mouth daily.   . fluticasone (FLONASE) 50 MCG/ACT nasal spray Place 2 sprays into both nostrils daily as needed for allergies.  Marland Kitchen gabapentin (NEURONTIN) 300 MG capsule Take 300 mg by mouth 2 (two) times daily.   Marland Kitchen glipiZIDE (GLUCOTROL) 10 MG tablet TAKE 1 TABLET (10 MG TOTAL) BY MOUTH 2 (TWO) TIMES DAILY BEFORE A MEAL. FOR DIABETES.  Marland Kitchen glucose blood (ONETOUCH ULTRA) test strip USE UP TO 4 TIMES  DAILY AS DIRECTED  . Insulin Glargine (BASAGLAR KWIKPEN) 100 UNIT/ML Inject 20 Units into the skin at bedtime. For diabetes.  . Insulin Pen Needle (BD PEN NEEDLE NANO U/F) 32G X 4 MM MISC Use with insulin as prescribed  Dx Code: E11.9  . KRILL OIL PO Take 350 mg by mouth daily.  . Lancets MISC USE UP TO 3 TIMES DAILY AS DIRECTED  . lidocaine-prilocaine (EMLA) cream  Apply 1 application topically as needed.  Marland Kitchen lisinopril (ZESTRIL) 5 MG tablet TAKE 1 TABLET BY MOUTH EVERY DAY  . Melatonin 10 MG CAPS Take 10 mg by mouth at bedtime.  . metFORMIN (GLUCOPHAGE) 1000 MG tablet TAKE 1 TABLET (1,000 MG TOTAL) BY MOUTH 2 (TWO) TIMES DAILY WITH A MEAL.  . Multiple Vitamin (MULTI-VITAMINS) TABS Take 1 tablet by mouth daily.   Marland Kitchen OVER THE COUNTER MEDICATION Take by mouth.  Marland Kitchen oxymetazoline (AFRIN) 0.05 % nasal spray Place 1 spray into both nostrils 2 (two) times daily as needed for congestion.  . pioglitazone (ACTOS) 45 MG tablet Take 1 tablet (45 mg total) by mouth daily. For diabetes.  . pravastatin (PRAVACHOL) 40 MG tablet TAKE 1 TABLET BY MOUTH EVERY DAY IN THE EVENING FOR CHOLESTEROL  . prochlorperazine (COMPAZINE) 10 MG tablet Take 1 tablet (10 mg total) by mouth every 6 (six) hours as needed for nausea or vomiting.  . sildenafil (REVATIO) 20 MG tablet Take 3-5 tablets by mouth 1 hour prior to intercourse as needed.  . SYRINGE-NEEDLE, DISP, 3 ML (B-D 3CC LUER-LOK SYR 22GX1") 22G X 1" 3 ML MISC USE AS INSTRUCTED FOR TESTOSTERONE INJECTION EVERY 2 WEEKS  . testosterone cypionate (DEPOTESTOSTERONE CYPIONATE) 200 MG/ML injection Inject 200 mg into the muscle every 14 (fourteen) days.   No facility-administered encounter medications on file as of 02/13/2021.    Allergies (verified) Bupropion   History: Past Medical History:  Diagnosis Date  . Cancer ([place])   . Chickenpox   . Chronic knee pain   . Chronic low back pain   . Essential hypertension   . GERD (gastroesophageal reflux disease)   . OSA (obstructive sleep apnea)   . Testosterone deficiency   . Type 2 diabetes mellitus ([place])    Past Surgical History:  Procedure Laterality Date  . COLONOSCOPY WITH PROPOFOL N/A 12/17/2018   Procedure: COLONOSCOPY WITH PROPOFOL;  Surgeon: Virgel Manifold, MD;  Location: [facility] ENDOSCOPY;  Service: Gastroenterology;  Laterality: N/A;  . IR IMAGING GUIDED PORT INSERTION   04/17/2020  . JOINT REPLACEMENT Bilateral   . REPLACEMENT TOTAL KNEE BILATERAL  2015  . TONSILLECTOMY  1960   Family History  Problem Relation Age of Onset  . Cancer Mother   . Hypertension Mother   . Arthritis Father   . Asthma Father   . Cancer Father   . COPD Father   . Heart attack Father   . Hypertension Sister   . Cancer Sister   . Diabetes Sister   . Asthma Son   . Birth defects Maternal Grandfather   . Arthritis Paternal Grandmother   . Diabetes Paternal Grandmother   . Arthritis Paternal Grandfather   . Asthma Sister   . Cancer Sister   . COPD Sister   . Arthritis Sister   . Asthma Sister   . Diabetes Sister   . Other Neg Hx        pituitary disorder   Social History   Socioeconomic History  . Marital status: Married    Spouse name: Not  on file  . Number of children: Not on file  . Years of education: Not on file  . Highest education level: Not on file  Occupational History  . Not on file  Tobacco Use  . Smoking status: Former Research scientist (life sciences)  . Smokeless tobacco: Never Used  Vaping Use  . Vaping Use: Never used  Substance and Sexual Activity  . Alcohol use: Yes    Comment: rarely  . Drug use: Not Currently  . Sexual activity: Yes  Other Topics Concern  . Not on file  Social History Narrative   Married.   Moved from [place].   Retired.   Social Determinants of Health   Financial Resource Strain: Low Risk   . Difficulty of Paying Living Expenses: Not hard at all  Food Insecurity: No Food Insecurity  . Worried About Charity fundraiser in the Last Year: Never true  . Ran Out of Food in the Last Year: Never true  Transportation Needs: No Transportation Needs  . Lack of Transportation (Medical): No  . Lack of Transportation (Non-Medical): No  Physical Activity: Inactive  . Days of Exercise per Week: 0 days  . Minutes of Exercise per Session: 0 min  Stress: No Stress Concern Present  . Feeling of Stress : Not at all  Social Connections: Not on file     Tobacco Counseling Counseling given: Not Answered   Clinical Intake:  Pre-visit preparation completed: Yes  Pain : No/denies pain Pain Score: 0-No pain     Diabetes: Yes CBG done?: No Did pt. bring in CBG monitor from home?: No  How often do you need to have someone help you when you read instructions, pamphlets, or other written materials from your doctor or pharmacy?: 1 - Never  Diabetic: Yes Nutrition Risk Assessment:  Has the patient had any N/V/D within the last 2 months?  No  Does the patient have any non-healing wounds?  No  Has the patient had any unintentional weight loss or weight gain?  No   Diabetes:  Is the patient diabetic?  Yes  If diabetic, was a CBG obtained today?  No  telephone visit  Did the patient bring in their glucometer from home?  No  telephone visit  How often do you monitor your CBG's? daily.   Financial Strains and Diabetes Management:  Are you having any financial strains with the device, your supplies or your medication? No .  Does the patient want to be seen by Chronic Care Management for management of their diabetes?  No  Would the patient like to be referred to a Nutritionist or for Diabetic Management?  No   Diabetic Exams:  Diabetic Eye Exam: Overdue for diabetic eye exam. Pt has been advised about the importance in completing this exam. Patient advised to call and schedule an eye exam. Diabetic Foot Exam: Overdue, Pt has been advised about the importance in completing this exam. Pt is scheduled for diabetic foot exam on 08/02/2021.   Interpreter Needed?: No  Information entered by :: CJohnson, LPN   Activities of Daily Living In your present state of health, do you have any difficulty performing the following activities: 02/13/2021  Hearing? N  Vision? N  Difficulty concentrating or making decisions? N  Walking or climbing stairs? N  Dressing or bathing? N  Doing errands, shopping? N  Preparing Food and eating ? N   Using the Toilet? N  In the past six months, have you accidently leaked urine? Y  Comment  occasional  Do you have problems with loss of bowel control? N  Managing your Medications? N  Managing your Finances? N  Housekeeping or managing your Housekeeping? N  Some recent data might be hidden    Patient Care Team: [name], NP as PCP - General (Internal Medicine) Valrie Hart, RN as Oncology Nurse Navigator  Indicate any recent Medical Services you may have received from other than [facility] providers in the past year (date may be approximate).     Assessment:   This is a routine wellness examination for Adam Wise.  Hearing/Vision screen  Hearing Screening   '125Hz'$  $Remo'250Hz'fEJfJ$'500Hz'$'1000Hz'$'2000Hz'$'3000Hz'$'4000Hz'$'6000Hz'$'8000Hz'$   Right ear:           Left ear:           Vision Screening Comments: Patient gets annual eye exams   Dietary issues and exercise activities discussed: Current Exercise Habits: The patient does not participate in regular exercise at present, Exercise limited by: None identified  Goals    . Increase physical activity     Starting 10/27/2018, I will attempt to exercise for 60 minutes 3 days per week.     . Patient Stated     11/01/2019, I will maintain and continue medications as prescribed.     . Patient Stated     02/13/2021, I will maintain and continue medications as prescribed.      Depression Screen PHQ 2/9 Scores 02/13/2021 01/28/2021 11/01/2019 10/27/2018  PHQ - 2 Score 0 0 0 0  PHQ- 9 Score 0 2 0 0    Fall Risk Fall Risk  02/13/2021 01/28/2021 11/01/2019 10/27/2018 09/13/2018  Falls in the past year? 0 0 0 0 No  Number falls in past yr: 0 0 0 - -  Injury with Fall? 0 0 0 - -  Risk for fall due to : Medication side effect - Medication side effect - -  Follow up Falls evaluation completed;Falls prevention discussed - Falls evaluation completed;Falls prevention discussed - -    FALL RISK PREVENTION PERTAINING TO THE HOME:  Any stairs in or around  the home? Yes  If so, are there any without handrails? No  Home free of loose throw rugs in walkways, pet beds, electrical cords, etc? Yes  Adequate lighting in your home to reduce risk of falls? Yes   ASSISTIVE DEVICES UTILIZED TO PREVENT FALLS:  Life alert? No  Use of a cane, walker or w/c? No  Grab bars in the bathroom? Yes  Shower chair or bench in shower? No  Elevated toilet seat or a handicapped toilet? No   TIMED UP AND GO:  Was the test performed? N/A telephone visit .  Cognitive Function: MMSE - Mini Mental State Exam 02/13/2021 11/01/2019 10/27/2018  Not completed: Refused - -  Orientation to time - 5 5  Orientation to Place - 5 5  Registration - 3 3  Attention/ Calculation - 5 0  Recall - 3 3  Language- name 2 objects - - 0  Language- repeat - 1 1  Language- follow 3 step command - - 3  Language- read & follow direction - - 0  Write a sentence - - 0  Copy design - - 0  Total score - - 20  Mini Cog  Mini-Cog screen was not completed. Patient refused. Maximum score is 22. A value of 0 denotes this part of the MMSE was not completed or the patient failed this part of the Mini-Cog  screening.       Immunizations Immunization History  Administered Date(s) Administered  . Fluad Quad(high Dose 65+) 10/26/2020  . Influenza Inj Mdck Quad Pf 09/18/2017  . Influenza,inj,Quad PF,6+ Mos 10/27/2018, 11/08/2019  . Influenza-Unspecified 09/18/2017  . PFIZER(Purple Top)SARS-COV-2 Vaccination 03/12/2020, 04/02/2020, 12/11/2020  . Pneumococcal Conjugate-13 08/17/2015  . Pneumococcal Polysaccharide-23 11/25/2012, 10/27/2018  . Td 08/27/2016    TDAP status: Up to date  Flu Vaccine status: Up to date  Pneumococcal vaccine status: Up to date  Covid-19 vaccine status: Completed vaccines  Qualifies for Shingles Vaccine? Yes   Zostavax completed No   Shingrix Completed?: No.    Education has been provided regarding the importance of this vaccine. Patient has been advised to  call insurance company to determine out of pocket expense if they have not yet received this vaccine. Advised may also receive vaccine at local pharmacy or Health Dept. Verbalized acceptance and understanding.  Screening Tests Health Maintenance  Topic Date Due  . OPHTHALMOLOGY EXAM  11/15/2019  . FOOT EXAM  11/07/2020  . COVID-19 Vaccine (4 - Booster for Pfizer series) 06/11/2021  . HEMOGLOBIN A1C  07/28/2021  . COLONOSCOPY (Pts 45-22yrs Insurance coverage will need to be confirmed)  12/17/2021  . TETANUS/TDAP  08/27/2026  . INFLUENZA VACCINE  Completed  . Hepatitis C Screening  Completed  . PNA vac Low Risk Adult  Completed  . HPV VACCINES  Aged Out    Health Maintenance  Health Maintenance Due  Topic Date Due  . OPHTHALMOLOGY EXAM  11/15/2019  . FOOT EXAM  11/07/2020    Colorectal cancer screening: Type of screening: Colonoscopy. Completed 12/17/2018. Repeat every 2 years  Lung Cancer Screening: (Low Dose CT Chest recommended if Age 33-80 years, 30 pack-year currently smoking OR have quit w/in 15years.) does not qualify.    Additional Screening:  Hepatitis C Screening: does qualify; Completed 10/27/2018  Vision Screening: Recommended annual ophthalmology exams for early detection of glaucoma and other disorders of the eye. Is the patient up to date with their annual eye exam?  Yes  Who is the provider or what is the name of the office in which the patient attends annual eye exams? [facility] in [place] If pt is not established with a provider, would they like to be referred to a provider to establish care? No .   Dental Screening: Recommended annual dental exams for proper oral hygiene  Community Resource Referral / Chronic Care Management: CRR required this visit?  No   CCM required this visit?  No      Plan:     I have personally reviewed and noted the following in the patient's chart:   . Medical and social history . Use of alcohol, tobacco or illicit drugs   . Current medications and supplements . Functional ability and status . Nutritional status . Physical activity . Advanced directives . List of other physicians . Hospitalizations, surgeries, and ER visits in previous 12 months . Vitals . Screenings to include cognitive, depression, and falls . Referrals and appointments  In addition, I have reviewed and discussed with patient certain preventive protocols, quality metrics, and best practice recommendations. A written personalized care plan for preventive services as well as general preventive health recommendations were provided to patient.   Due to this being a telephonic visit, the after visit summary with patients personalized plan was offered to patient via office or my-chart. Patient preferred to pick up at office at next visit or via mychart.   Wynetta Emery,  Dewitt Hoes, LPN   04/21/3219

## 2021-02-13 NOTE — Progress Notes (Signed)
PCP notes:  Health Maintenance: Eye exam- due foot exam- due   Abnormal Screenings: none   Patient concerns: none   Nurse concerns: none   Next PCP appt.: 08/02/2021 @ 10:20 am

## 2021-02-13 NOTE — Patient Instructions (Addendum)
[name] , Thank you for taking time to come for your Medicare Wellness Visit. I appreciate your ongoing commitment to your health goals. Please review the following plan we discussed and let me know if I can assist you in the future.   Screening recommendations/referrals: Colonoscopy: Up to date, completed 12/17/2018, due 12/2021 Recommended yearly ophthalmology/optometry visit for glaucoma screening and checkup Recommended yearly dental visit for hygiene and checkup  Vaccinations: Influenza vaccine: Up to date, completed 10/26/2020, due 07/2021 Pneumococcal vaccine: Completed series Tdap vaccine: Up to date, completed 08/27/2016, due 08/2026 Shingles vaccine: due, check wit your insurance regarding coverage if interested    Covid-19: Completed series  Advanced directives: copy in chart  Conditions/risks identified: diabetes, hypertension, hyperlipidemia   Next appointment: Follow up in one year for your annual wellness visit.   Preventive Care 71 Years and Older, Male Preventive care refers to lifestyle choices and visits with your health care provider that can promote health and wellness. What does preventive care include?  A yearly physical exam. This is also called an annual well check.  Dental exams once or twice a year.  Routine eye exams. Ask your health care provider how often you should have your eyes checked.  Personal lifestyle choices, including:  Daily care of your teeth and gums.  Regular physical activity.  Eating a healthy diet.  Avoiding tobacco and drug use.  Limiting alcohol use.  Practicing safe sex.  Taking low doses of aspirin every day.  Taking vitamin and mineral supplements as recommended by your health care provider. What happens during an annual well check? The services and screenings done by your health care provider during your annual well check will depend on your age, overall health, lifestyle risk factors, and family history of  disease. Counseling  Your health care provider may ask you questions about your:  Alcohol use.  Tobacco use.  Drug use.  Emotional well-being.  Home and relationship well-being.  Sexual activity.  Eating habits.  History of falls.  Memory and ability to understand (cognition).  Work and work Statistician. Screening  You may have the following tests or measurements:  Height, weight, and BMI.  Blood pressure.  Lipid and cholesterol levels. These may be checked every 5 years, or more frequently if you are over 22 years old.  Skin check.  Lung cancer screening. You may have this screening every year starting at age 66 if you have a 30-pack-year history of smoking and currently smoke or have quit within the past 15 years.  Fecal occult blood test (FOBT) of the stool. You may have this test every year starting at age 76.  Flexible sigmoidoscopy or colonoscopy. You may have a sigmoidoscopy every 5 years or a colonoscopy every 10 years starting at age 1.  Prostate cancer screening. Recommendations will vary depending on your family history and other risks.  Hepatitis C blood test.  Hepatitis B blood test.  Sexually transmitted disease (STD) testing.  Diabetes screening. This is done by checking your blood sugar (glucose) after you have not eaten for a while (fasting). You may have this done every 1-3 years.  Abdominal aortic aneurysm (AAA) screening. You may need this if you are a current or former smoker.  Osteoporosis. You may be screened starting at age 73 if you are at high risk. Talk with your health care provider about your test results, treatment options, and if necessary, the need for more tests. Vaccines  Your health care provider may recommend certain vaccines,  such as:  Influenza vaccine. This is recommended every year.  Tetanus, diphtheria, and acellular pertussis (Tdap, Td) vaccine. You may need a Td booster every 10 years.  Zoster vaccine. You may  need this after age 68.  Pneumococcal 13-valent conjugate (PCV13) vaccine. One dose is recommended after age 71.  Pneumococcal polysaccharide (PPSV23) vaccine. One dose is recommended after age 71. Talk to your health care provider about which screenings and vaccines you need and how often you need them. This information is not intended to replace advice given to you by your health care provider. Make sure you discuss any questions you have with your health care provider. Document Released: 12/28/2015 Document Revised: 08/20/2016 Document Reviewed: 10/02/2015 Elsevier Interactive Patient Education  2017 [place] Prevention in the Home Falls can cause injuries. They can happen to people of all ages. There are many things you can do to make your home safe and to help prevent falls. What can I do on the outside of my home?  Regularly fix the edges of walkways and driveways and fix any cracks.  Remove anything that might make you trip as you walk through a door, such as a raised step or threshold.  Trim any bushes or trees on the path to your home.  Use bright outdoor lighting.  Clear any walking paths of anything that might make someone trip, such as rocks or tools.  Regularly check to see if handrails are loose or broken. Make sure that both sides of any steps have handrails.  Any raised decks and porches should have guardrails on the edges.  Have any leaves, snow, or ice cleared regularly.  Use sand or salt on walking paths during winter.  Clean up any spills in your garage right away. This includes oil or grease spills. What can I do in the bathroom?  Use night lights.  Install grab bars by the toilet and in the tub and shower. Do not use towel bars as grab bars.  Use non-skid mats or decals in the tub or shower.  If you need to sit down in the shower, use a plastic, non-slip stool.  Keep the floor dry. Clean up any water that spills on the floor as soon as it  happens.  Remove soap buildup in the tub or shower regularly.  Attach bath mats securely with double-sided non-slip rug tape.  Do not have throw rugs and other things on the floor that can make you trip. What can I do in the bedroom?  Use night lights.  Make sure that you have a light by your bed that is easy to reach.  Do not use any sheets or blankets that are too big for your bed. They should not hang down onto the floor.  Have a firm chair that has side arms. You can use this for support while you get dressed.  Do not have throw rugs and other things on the floor that can make you trip. What can I do in the kitchen?  Clean up any spills right away.  Avoid walking on wet floors.  Keep items that you use a lot in easy-to-reach places.  If you need to reach something above you, use a strong step stool that has a grab bar.  Keep electrical cords out of the way.  Do not use floor polish or wax that makes floors slippery. If you must use wax, use non-skid floor wax.  Do not have throw rugs and other things on  the floor that can make you trip. What can I do with my stairs?  Do not leave any items on the stairs.  Make sure that there are handrails on both sides of the stairs and use them. Fix handrails that are broken or loose. Make sure that handrails are as long as the stairways.  Check any carpeting to make sure that it is firmly attached to the stairs. Fix any carpet that is loose or worn.  Avoid having throw rugs at the top or bottom of the stairs. If you do have throw rugs, attach them to the floor with carpet tape.  Make sure that you have a light switch at the top of the stairs and the bottom of the stairs. If you do not have them, ask someone to add them for you. What else can I do to help prevent falls?  Wear shoes that:  Do not have high heels.  Have rubber bottoms.  Are comfortable and fit you well.  Are closed at the toe. Do not wear sandals.  If you  use a stepladder:  Make sure that it is fully opened. Do not climb a closed stepladder.  Make sure that both sides of the stepladder are locked into place.  Ask someone to hold it for you, if possible.  Clearly mark and make sure that you can see:  Any grab bars or handrails.  First and last steps.  Where the edge of each step is.  Use tools that help you move around (mobility aids) if they are needed. These include:  Canes.  Walkers.  Scooters.  Crutches.  Turn on the lights when you go into a dark area. Replace any light bulbs as soon as they burn out.  Set up your furniture so you have a clear path. Avoid moving your furniture around.  If any of your floors are uneven, fix them.  If there are any pets around you, be aware of where they are.  Review your medicines with your doctor. Some medicines can make you feel dizzy. This can increase your chance of falling. Ask your doctor what other things that you can do to help prevent falls. This information is not intended to replace advice given to you by your health care provider. Make sure you discuss any questions you have with your health care provider. Document Released: 09/27/2009 Document Revised: 05/08/2016 Document Reviewed: 01/05/2015 Elsevier Interactive Patient Education  2017 [employer].

## 2021-02-18 ENCOUNTER — Telehealth: Payer: Self-pay | Admitting: Medical Oncology

## 2021-02-18 NOTE — Telephone Encounter (Signed)
LVM that I sent message to add port flush with labs to all lab appt.

## 2021-02-20 ENCOUNTER — Encounter: Payer: Self-pay | Admitting: Internal Medicine

## 2021-02-20 ENCOUNTER — Inpatient Hospital Stay: Payer: Medicare Other

## 2021-02-20 ENCOUNTER — Inpatient Hospital Stay (HOSPITAL_BASED_OUTPATIENT_CLINIC_OR_DEPARTMENT_OTHER): Payer: Medicare Other | Admitting: Internal Medicine

## 2021-02-20 ENCOUNTER — Other Ambulatory Visit: Payer: Self-pay | Admitting: Internal Medicine

## 2021-02-20 ENCOUNTER — Other Ambulatory Visit: Payer: Self-pay

## 2021-02-20 VITALS — BP 149/91 | HR 100 | Temp 98.0°F | Resp 17 | Wt 286.1 lb

## 2021-02-20 DIAGNOSIS — Z5112 Encounter for antineoplastic immunotherapy: Secondary | ICD-10-CM

## 2021-02-20 DIAGNOSIS — R059 Cough, unspecified: Secondary | ICD-10-CM

## 2021-02-20 DIAGNOSIS — Z79899 Other long term (current) drug therapy: Secondary | ICD-10-CM | POA: Diagnosis not present

## 2021-02-20 DIAGNOSIS — C3411 Malignant neoplasm of upper lobe, right bronchus or lung: Secondary | ICD-10-CM

## 2021-02-20 DIAGNOSIS — Z5111 Encounter for antineoplastic chemotherapy: Secondary | ICD-10-CM

## 2021-02-20 DIAGNOSIS — R5383 Other fatigue: Secondary | ICD-10-CM | POA: Diagnosis not present

## 2021-02-20 DIAGNOSIS — C3490 Malignant neoplasm of unspecified part of unspecified bronchus or lung: Secondary | ICD-10-CM | POA: Diagnosis not present

## 2021-02-20 DIAGNOSIS — Z5189 Encounter for other specified aftercare: Secondary | ICD-10-CM | POA: Diagnosis not present

## 2021-02-20 LAB — CBC WITH DIFFERENTIAL (CANCER CENTER ONLY)
Basophils Absolute: 0.1 10*3/uL (ref 0.0–0.1)
Basophils Relative: 1 %
Eosinophils Absolute: 0.1 10*3/uL (ref 0.0–0.5)
Eosinophils Relative: 2 %
HCT: 37.7 % — ABNORMAL LOW (ref 39.0–52.0)
Hemoglobin: 12.1 g/dL — ABNORMAL LOW (ref 13.0–17.0)
Lymphocytes Relative: 12 %
Lymphs Abs: 0.8 10*3/uL (ref 0.7–4.0)
MCH: 29.7 pg (ref 26.0–34.0)
MCHC: 32.1 g/dL (ref 30.0–36.0)
MCV: 92.4 fL (ref 80.0–100.0)
Monocytes Absolute: 0.7 10*3/uL (ref 0.1–1.0)
Monocytes Relative: 10 %
Neutro Abs: 4.7 10*3/uL (ref 1.7–7.7)
Neutrophils Relative %: 74 %
Platelet Count: 171 10*3/uL (ref 150–400)
RBC: 4.08 MIL/uL — ABNORMAL LOW (ref 4.22–5.81)
RDW: 18.5 % — ABNORMAL HIGH (ref 11.5–15.5)
WBC Count: 6.4 10*3/uL (ref 4.0–10.5)
nRBC: 0 % (ref 0.0–0.2)

## 2021-02-20 LAB — CMP (CANCER CENTER ONLY)
ALT: 20 U/L (ref 0–44)
AST: 18 U/L (ref 15–41)
Albumin: 3.8 g/dL (ref 3.5–5.0)
Alkaline Phosphatase: 80 U/L (ref 38–126)
Anion gap: 9 (ref 5–15)
BUN: 15 mg/dL (ref 8–23)
CO2: 25 mmol/L (ref 22–32)
Calcium: 8.7 mg/dL — ABNORMAL LOW (ref 8.9–10.3)
Chloride: 104 mmol/L (ref 98–111)
Creatinine: 1.33 mg/dL — ABNORMAL HIGH (ref 0.61–1.24)
GFR, Estimated: 58 mL/min — ABNORMAL LOW (ref >60)
Glucose, Bld: 177 mg/dL — ABNORMAL HIGH (ref 70–99)
Potassium: 4.5 mmol/L (ref 3.5–5.1)
Sodium: 138 mmol/L (ref 135–145)
Total Bilirubin: 0.3 mg/dL (ref 0.3–1.2)
Total Protein: 6.2 g/dL — ABNORMAL LOW (ref 6.5–8.1)

## 2021-02-20 MED ORDER — FAMOTIDINE IN NACL 20-0.9 MG/50ML-% IV SOLN
INTRAVENOUS | Status: AC
  Filled 2021-02-20: qty 50

## 2021-02-20 MED ORDER — PALONOSETRON HCL INJECTION 0.25 MG/5ML
INTRAVENOUS | Status: AC
  Filled 2021-02-20: qty 5

## 2021-02-20 MED ORDER — PALONOSETRON HCL INJECTION 0.25 MG/5ML
0.2500 mg | Freq: Once | INTRAVENOUS | Status: AC
  Administered 2021-02-20: 0.25 mg via INTRAVENOUS

## 2021-02-20 MED ORDER — SODIUM CHLORIDE 0.9 % IV SOLN
Freq: Once | INTRAVENOUS | Status: AC
  Filled 2021-02-20: qty 250

## 2021-02-20 MED ORDER — FAMOTIDINE IN NACL 20-0.9 MG/50ML-% IV SOLN
20.0000 mg | Freq: Once | INTRAVENOUS | Status: AC
  Administered 2021-02-20: 20 mg via INTRAVENOUS

## 2021-02-20 MED ORDER — TRILACICLIB DIHYDROCHLORIDE INJECTION 300 MG
240.0000 mg/m2 | Freq: Once | INTRAVENOUS | Status: AC
  Administered 2021-02-20: 600 mg via INTRAVENOUS
  Filled 2021-02-20: qty 40

## 2021-02-20 MED ORDER — SODIUM CHLORIDE 0.9% FLUSH
10.0000 mL | INTRAVENOUS | Status: DC | PRN
  Filled 2021-02-20: qty 10

## 2021-02-20 MED ORDER — SODIUM CHLORIDE 0.9 % IV SOLN
150.0000 mg | Freq: Once | INTRAVENOUS | Status: AC
  Administered 2021-02-20: 150 mg via INTRAVENOUS
  Filled 2021-02-20: qty 150

## 2021-02-20 MED ORDER — SODIUM CHLORIDE 0.9 % IV SOLN
1200.0000 mg | Freq: Once | INTRAVENOUS | Status: AC
  Administered 2021-02-20: 1200 mg via INTRAVENOUS
  Filled 2021-02-20: qty 20

## 2021-02-20 MED ORDER — DIPHENHYDRAMINE HCL 50 MG/ML IJ SOLN
25.0000 mg | Freq: Once | INTRAMUSCULAR | Status: AC
  Administered 2021-02-20: 25 mg via INTRAVENOUS

## 2021-02-20 MED ORDER — DIPHENHYDRAMINE HCL 50 MG/ML IJ SOLN
INTRAMUSCULAR | Status: AC
  Filled 2021-02-20: qty 1

## 2021-02-20 MED ORDER — HYDROCOD POLST-CPM POLST ER 10-8 MG/5ML PO SUER: 5.0000 mL | Freq: Every evening | ORAL | 0 refills | Status: DC | PRN

## 2021-02-20 MED ORDER — CARBOPLATIN CHEMO INJECTION 600 MG/60ML
583.0000 mg | Freq: Once | INTRAVENOUS | Status: AC
  Administered 2021-02-20: 580 mg via INTRAVENOUS
  Filled 2021-02-20: qty 58

## 2021-02-20 MED ORDER — SODIUM CHLORIDE 0.9 % IV SOLN
100.0000 mg/m2 | Freq: Once | INTRAVENOUS | Status: AC
  Administered 2021-02-20: 250 mg via INTRAVENOUS
  Filled 2021-02-20: qty 12.5

## 2021-02-20 MED ORDER — SODIUM CHLORIDE 0.9 % IV SOLN
10.0000 mg | Freq: Once | INTRAVENOUS | Status: AC
  Administered 2021-02-20: 10 mg via INTRAVENOUS
  Filled 2021-02-20: qty 10

## 2021-02-20 MED ORDER — HEPARIN SOD (PORK) LOCK FLUSH 100 UNIT/ML IV SOLN
500.0000 [IU] | Freq: Once | INTRAVENOUS | Status: DC | PRN
  Filled 2021-02-20: qty 5

## 2021-02-20 NOTE — Patient Instructions (Signed)
[facility] Discharge Instructions for Patients Receiving Chemotherapy  Today you received the following chemotherapy agents: Atezolizumab (Tecentriq), Etoposide, and Carboplatin  To help prevent nausea and vomiting after your treatment, we encourage you to take your nausea medication  as prescribed.    If you develop nausea and vomiting that is not controlled by your nausea medication, call the clinic.   BELOW ARE SYMPTOMS THAT SHOULD BE REPORTED IMMEDIATELY:  *FEVER GREATER THAN 100.5 F  *CHILLS WITH OR WITHOUT FEVER  NAUSEA AND VOMITING THAT IS NOT CONTROLLED WITH YOUR NAUSEA MEDICATION  *UNUSUAL SHORTNESS OF BREATH  *UNUSUAL BRUISING OR BLEEDING  TENDERNESS IN MOUTH AND THROAT WITH OR WITHOUT PRESENCE OF ULCERS  *URINARY PROBLEMS  *BOWEL PROBLEMS  UNUSUAL RASH Items with * indicate a potential emergency and should be followed up as soon as possible.  Feel free to call the clinic should you have any questions or concerns. The clinic phone number is [phone].  Please show the [facility] at check-in to the Emergency Department and triage nurse.

## 2021-02-20 NOTE — Progress Notes (Signed)
[facility] [phone]   [phone]  OFFICE PROGRESS NOTE  [name], NP [place] [address]  DIAGNOSIS: Relapsed extensive stage (T4, N3, M1c)  small cell lung cancer diagnosed in April 2021 and presented with large right upper lobe/right hilar mass with ipsilateral and contralateral mediastinal and right supraclavicular lymphadenopathy in addition to multiple liver lesios. He has disease progression after the first line of chemotherapy in December 2021.  PRIOR THERAPY:  1) Palliative radiotherapy to the right upper lobe lung mass under the care of Dr. Lisbeth Renshaw. 2) Systemic chemotherapy with carboplatin for AUC of 5 on day 1, etoposide 100 mg/M2 on days 1, 2 and 3 in addition to Imfinzi 1500 mg IV every 3 weeks with chemotherapy then every 4 weeks for maintenance if the patient has no evidence for progression.  He will also receive Cosela 240 mg/m2 on the days of the chemotherapy.  Status post 9 cycles.  Starting from cycle #5 the patient will be on maintenance treatment with immunotherapy with Imfinzi 1500 mg IV every 4 weeks. Last dose of chemotherapy was given on November 13, 2020. This treatment was discontinued secondary to disease progression.  CURRENT THERAPY: Systemic chemotherapy with carboplatin for AUC of 5 on day 1, etoposide 100 mg/M2 on days 1, 2 and 3, Tecentriq 1200 mg IV every 3 weeks as well as Cosela 250 mg/M2 on the days of the chemotherapy every 3 weeks.  First dose December 18, 2020.  Status post 3 cycles.  INTERVAL HISTORY: Adam Wise 70 y.o. male returns to the clinic today for a follow-up visit accompanied by his wife.  The patient is feeling fine today with no concerning complaints except for mild shortness of breath with exertion in addition to cough.  He is requesting refill of his cough medication with Tussionex.  He denied having any chest pain or hemoptysis.  He has no nausea, vomiting, diarrhea or  constipation.  He has no headache or visual changes.  He tolerated the last cycle of his treatment fairly well.  The patient is here today for evaluation before starting cycle #4 of his treatment.  MEDICAL HISTORY: Past Medical History:  Diagnosis Date  . Cancer ([place])   . Chickenpox   . Chronic knee pain   . Chronic low back pain   . Essential hypertension   . GERD (gastroesophageal reflux disease)   . OSA (obstructive sleep apnea)   . Testosterone deficiency   . Type 2 diabetes mellitus ([facility])     ALLERGIES:  is allergic to bupropion.  MEDICATIONS:  Current Outpatient Medications  Medication Sig Dispense Refill  . albuterol (VENTOLIN HFA) 108 (90 Base) MCG/ACT inhaler Inhale 2 puffs into the lungs every 6 (six) hours as needed for wheezing or shortness of breath. 18 g 0  . amLODipine (NORVASC) 10 MG tablet TAKE 1 TABLET BY MOUTH EVERY DAY 90 tablet 1  . Black Pepper-Turmeric (TURMERIC COMPLEX/BLACK PEPPER PO) Take 1 tablet by mouth in the morning and at bedtime.    . blood glucose meter kit and supplies KIT Dispense based on patient and insurance preference. Use up to four times daily as directed. 1 each 0  . chlorpheniramine-HYDROcodone (TUSSIONEX) 10-8 MG/5ML SUER Take 5 mLs by mouth at bedtime as needed for cough. 140 mL 0  . Coenzyme Q10 ([employer] COQ-10) 200 MG capsule Take 200 mg by mouth every evening.     Marland Kitchen ELIQUIS 5 MG TABS  tablet TAKE 1 TABLET BY MOUTH TWICE A DAY 180 tablet 2  . esomeprazole (NEXIUM) 20 MG capsule Take 20 mg by mouth daily.     . fluticasone (FLONASE) 50 MCG/ACT nasal spray Place 2 sprays into both nostrils daily as needed for allergies.    Marland Kitchen gabapentin (NEURONTIN) 300 MG capsule Take 300 mg by mouth 2 (two) times daily.     Marland Kitchen glipiZIDE (GLUCOTROL) 10 MG tablet TAKE 1 TABLET (10 MG TOTAL) BY MOUTH 2 (TWO) TIMES DAILY BEFORE A MEAL. FOR DIABETES. 180 tablet 2  . glucose blood (ONETOUCH ULTRA) test strip USE UP TO 4 TIMES DAILY AS DIRECTED 100 strip 5  . Insulin  Glargine (BASAGLAR KWIKPEN) 100 UNIT/ML Inject 20 Units into the skin at bedtime. For diabetes. 15 mL 3  . Insulin Pen Needle (BD PEN NEEDLE NANO U/F) 32G X 4 MM MISC Use with insulin as prescribed  Dx Code: E11.9 100 each 2  . KRILL OIL PO Take 350 mg by mouth daily.    . Lancets MISC USE UP TO 3 TIMES DAILY AS DIRECTED 100 each 2  . lidocaine-prilocaine (EMLA) cream Apply 1 application topically as needed. 30 g 2  . lisinopril (ZESTRIL) 5 MG tablet TAKE 1 TABLET BY MOUTH EVERY DAY 90 tablet 1  . Melatonin 10 MG CAPS Take 10 mg by mouth at bedtime.    . metFORMIN (GLUCOPHAGE) 1000 MG tablet TAKE 1 TABLET (1,000 MG TOTAL) BY MOUTH 2 (TWO) TIMES DAILY WITH A MEAL. 180 tablet 0  . Multiple Vitamin (MULTI-VITAMINS) TABS Take 1 tablet by mouth daily.     Marland Kitchen OVER THE COUNTER MEDICATION Take by mouth.    Marland Kitchen oxymetazoline (AFRIN) 0.05 % nasal spray Place 1 spray into both nostrils 2 (two) times daily as needed for congestion.    . pioglitazone (ACTOS) 45 MG tablet Take 1 tablet (45 mg total) by mouth daily. For diabetes. 90 tablet 2  . pravastatin (PRAVACHOL) 40 MG tablet TAKE 1 TABLET BY MOUTH EVERY DAY IN THE EVENING FOR CHOLESTEROL 90 tablet 1  . prochlorperazine (COMPAZINE) 10 MG tablet Take 1 tablet (10 mg total) by mouth every 6 (six) hours as needed for nausea or vomiting. 30 tablet 0  . sildenafil (REVATIO) 20 MG tablet Take 3-5 tablets by mouth 1 hour prior to intercourse as needed. 50 tablet 0  . SYRINGE-NEEDLE, DISP, 3 ML (B-D 3CC LUER-LOK SYR 22GX1") 22G X 1" 3 ML MISC USE AS INSTRUCTED FOR TESTOSTERONE INJECTION EVERY 2 WEEKS 10 each 2  . testosterone cypionate (DEPOTESTOSTERONE CYPIONATE) 200 MG/ML injection Inject 200 mg into the muscle every 14 (fourteen) days.     No current facility-administered medications for this visit.    SURGICAL HISTORY:  Past Surgical History:  Procedure Laterality Date  . COLONOSCOPY WITH PROPOFOL N/A 12/17/2018   Procedure: COLONOSCOPY WITH PROPOFOL;   Surgeon: Virgel Manifold, MD;  Location: [facility] ENDOSCOPY;  Service: Gastroenterology;  Laterality: N/A;  . IR IMAGING GUIDED PORT INSERTION  04/17/2020  . JOINT REPLACEMENT Bilateral   . REPLACEMENT TOTAL KNEE BILATERAL  2015  . TONSILLECTOMY  1960    REVIEW OF SYSTEMS:  A comprehensive review of systems was negative except for: Constitutional: positive for fatigue Respiratory: positive for cough and dyspnea on exertion   PHYSICAL EXAMINATION: General appearance: alert, cooperative, fatigued and no distress Head: atraumatic Neck: no adenopathy, no JVD, supple, symmetrical, trachea midline and thyroid not enlarged, symmetric, no tenderness/mass/nodules Lymph nodes: Cervical, supraclavicular, and axillary nodes  normal. Resp: clear to auscultation bilaterally Back: symmetric, no curvature. ROM normal. No CVA tenderness. Cardio: regular rate and rhythm, S1, S2 normal, no murmur, click, rub or gallop GI: soft, non-tender; bowel sounds normal; no masses,  no organomegaly Extremities: extremities normal, atraumatic, no cyanosis or edema  ECOG PERFORMANCE STATUS: 1 - Symptomatic but completely ambulatory  Blood pressure (!) 149/91, pulse 100, temperature 98 F (36.7 C), temperature source Tympanic, resp. rate 17, weight 286 lb 1.6 oz (129.8 kg), SpO2 96 %.  LABORATORY DATA: Lab Results  Component Value Date   WBC 9.0 02/13/2021   HGB 12.4 (L) 02/13/2021   HCT 39.6 02/13/2021   MCV 93.8 02/13/2021   PLT 64 (L) 02/13/2021      Chemistry      Component Value Date/Time   NA 143 02/13/2021 1017   K 4.6 02/13/2021 1017   CL 105 02/13/2021 1017   CO2 27 02/13/2021 1017   BUN 12 02/13/2021 1017   CREATININE 1.31 (H) 02/13/2021 1017      Component Value Date/Time   CALCIUM 9.2 02/13/2021 1017   ALKPHOS 105 02/13/2021 1017   AST 18 02/13/2021 1017   ALT 20 02/13/2021 1017   BILITOT 0.3 02/13/2021 1017       RADIOGRAPHIC STUDIES: DG Chest 1 View  Result Date:  02/04/2021 CLINICAL DATA:  Patient status post right thoracentesis today. EXAM: CHEST  1 VIEW COMPARISON:  Single-view of the chest 12/20/2020. FINDINGS: Right pleural effusion seen on the prior exam is decreased after thoracentesis. No pneumothorax. Post treatment change and mass lesion in the right hilar and suprahilar spaces again seen. Left lung is clear. Heart size is normal. Port-A-Cath is unchanged. IMPRESSION: Decreased pleural effusion after thoracentesis. Negative for pneumothorax. No new abnormality. Electronically Signed   By: Inge Rise M.D.   On: 02/04/2021 11:25   CT Chest W Contrast  Result Date: 01/29/2021 CLINICAL DATA:  History of right-sided small cell lung cancer diagnosed in 2021, chemo and radiation complete. Currently undergoing immunotherapy. Patient complains chronic shortness of breath of recent blood clot. EXAM: CT CHEST, ABDOMEN, AND PELVIS WITH CONTRAST TECHNIQUE: Multidetector CT imaging of the chest, abdomen and pelvis was performed following the standard protocol during bolus administration of intravenous contrast. CONTRAST:  157mL OMNIPAQUE IOHEXOL 300 MG/ML  SOLN COMPARISON:  Multiple priors including most recent CT chest abdomen and pelvis December 10, 2020 FINDINGS: CT CHEST FINDINGS Cardiovascular: Unchanged position of the accessed right chest Port-A-Cath. Normal size heart. No pericardial effusion. No evidence of thoracic aortic aneurysm. Aortic atherosclerosis. Coronary artery calcifications. Mediastinum/Nodes: Subcarinal lymph node now measures 9 mm previously 10 mm (series 2, image 28). Low-density soft tissue in the low right paratracheal region again favored represent fluid (series 2, image 24). The bilobed fluid in the right epicardial region is unchanged from prior (series 2, image 37). The visualized thyroid is unremarkable. Trachea and esophagus are grossly unremarkable. Lungs/Pleura: Post treatment/radiation changes medial right upper lobe and central right  lower lobe/perihilar region appear similar to prior. Unchanged size of the moderate right pleural effusion. The left lung is clear. No left pleural effusion. No pneumothorax. Musculoskeletal: Thoracic spondylosis. No suspicious lytic or blastic lesion of bone. CT ABDOMEN PELVIS FINDINGS Hepatobiliary: Decreased size of the bilobar hepatic metastasis. Index lesions are as follows: -segment IVA lesion now measures 1.3 cm previously 3.1 cm (series 2, image 50) -segment III lesion now measures 1.5 cm previously 3.2 cm (series 2, image 57) -caudate lesion now measures 1.5 cm previously  3.3 cm (series 2, image 57). -segment V lesion now measures 1.4 cm previously 2.8 cm (series 2, image 58). Gallbladder is unremarkable.  No biliary ductal dilatation. Pancreas: Unremarkable Spleen: Unremarkable Adrenals/Urinary Tract: Unchanged size of the 1.7 cm right adrenal lesion previously characterized as a benign adrenal adenoma on MRI abdomen April 11, 2020 (series 2, image 60) left adrenal gland is unremarkable. Multiple right renal cysts appear grossly unchanged measuring up to 6.7 cm (series 2, image 75). No hydronephrosis. Urinary bladder appears grossly unremarkable for degree of distension. Stomach/Bowel: Unremarkable decompressed appearance of the stomach. Duodenal diverticulum. No suspicious small bowel wall thickening or dilation. The appendix and terminal ileum appear within normal limits. No suspicious colonic wall thickening or mass like lesions. Scattered colonic diverticulosis without findings of acute diverticulitis. Vascular/Lymphatic: Aortic atherosclerosis. No enlarged abdominal or pelvic lymph nodes. Reproductive: Mild prostatomegaly. Other: No abdominopelvic ascites. Musculoskeletal: Multilevel degenerative changes spine. No suspicious lytic or blastic lesion of bone IMPRESSION: 1. Interval decreased bilobar hepatic metastasis. 2. Similar post treatment/radiation change within the right lung, with no significant  change in size of the moderate right pleural effusion. 3. Unchanged size of the right adrenal adenoma. 4. Aortic atherosclerosis. Aortic Atherosclerosis (ICD10-I70.0). Electronically Signed   By: Dahlia Bailiff MD   On: 01/29/2021 08:31   CT Abdomen Pelvis W Contrast  Result Date: 01/29/2021 CLINICAL DATA:  History of right-sided small cell lung cancer diagnosed in 2021, chemo and radiation complete. Currently undergoing immunotherapy. Patient complains chronic shortness of breath of recent blood clot. EXAM: CT CHEST, ABDOMEN, AND PELVIS WITH CONTRAST TECHNIQUE: Multidetector CT imaging of the chest, abdomen and pelvis was performed following the standard protocol during bolus administration of intravenous contrast. CONTRAST:  122mL OMNIPAQUE IOHEXOL 300 MG/ML  SOLN COMPARISON:  Multiple priors including most recent CT chest abdomen and pelvis December 10, 2020 FINDINGS: CT CHEST FINDINGS Cardiovascular: Unchanged position of the accessed right chest Port-A-Cath. Normal size heart. No pericardial effusion. No evidence of thoracic aortic aneurysm. Aortic atherosclerosis. Coronary artery calcifications. Mediastinum/Nodes: Subcarinal lymph node now measures 9 mm previously 10 mm (series 2, image 28). Low-density soft tissue in the low right paratracheal region again favored represent fluid (series 2, image 24). The bilobed fluid in the right epicardial region is unchanged from prior (series 2, image 37). The visualized thyroid is unremarkable. Trachea and esophagus are grossly unremarkable. Lungs/Pleura: Post treatment/radiation changes medial right upper lobe and central right lower lobe/perihilar region appear similar to prior. Unchanged size of the moderate right pleural effusion. The left lung is clear. No left pleural effusion. No pneumothorax. Musculoskeletal: Thoracic spondylosis. No suspicious lytic or blastic lesion of bone. CT ABDOMEN PELVIS FINDINGS Hepatobiliary: Decreased size of the bilobar hepatic  metastasis. Index lesions are as follows: -segment IVA lesion now measures 1.3 cm previously 3.1 cm (series 2, image 50) -segment III lesion now measures 1.5 cm previously 3.2 cm (series 2, image 57) -caudate lesion now measures 1.5 cm previously 3.3 cm (series 2, image 57). -segment V lesion now measures 1.4 cm previously 2.8 cm (series 2, image 58). Gallbladder is unremarkable.  No biliary ductal dilatation. Pancreas: Unremarkable Spleen: Unremarkable Adrenals/Urinary Tract: Unchanged size of the 1.7 cm right adrenal lesion previously characterized as a benign adrenal adenoma on MRI abdomen April 11, 2020 (series 2, image 60) left adrenal gland is unremarkable. Multiple right renal cysts appear grossly unchanged measuring up to 6.7 cm (series 2, image 75). No hydronephrosis. Urinary bladder appears grossly unremarkable for degree of distension. Stomach/Bowel:  Unremarkable decompressed appearance of the stomach. Duodenal diverticulum. No suspicious small bowel wall thickening or dilation. The appendix and terminal ileum appear within normal limits. No suspicious colonic wall thickening or mass like lesions. Scattered colonic diverticulosis without findings of acute diverticulitis. Vascular/Lymphatic: Aortic atherosclerosis. No enlarged abdominal or pelvic lymph nodes. Reproductive: Mild prostatomegaly. Other: No abdominopelvic ascites. Musculoskeletal: Multilevel degenerative changes spine. No suspicious lytic or blastic lesion of bone IMPRESSION: 1. Interval decreased bilobar hepatic metastasis. 2. Similar post treatment/radiation change within the right lung, with no significant change in size of the moderate right pleural effusion. 3. Unchanged size of the right adrenal adenoma. 4. Aortic atherosclerosis. Aortic Atherosclerosis (ICD10-I70.0). Electronically Signed   By: Dahlia Bailiff MD   On: 01/29/2021 08:31   US Thoracentesis Asp Pleural space w/IMG guide  Result Date: 02/04/2021 INDICATION: Patient with  history of extensive stage small cell lung cancer, recurrent right pleural effusion, dyspnea. Request received for therapeutic right thoracentesis. EXAM: ULTRASOUND GUIDED THERAPEUTIC RIGHT THORACENTESIS MEDICATIONS: 1% lidocaine to skin and subcutaneous tissue COMPLICATIONS: None immediate. PROCEDURE: An ultrasound guided thoracentesis was thoroughly discussed with the patient and questions answered. The benefits, risks, alternatives and complications were also discussed. The patient understands and wishes to proceed with the procedure. Written consent was obtained. Ultrasound was performed to localize and mark an adequate pocket of fluid in the right chest. The area was then prepped and draped in the normal sterile fashion. 1% Lidocaine was used for local anesthesia. Under ultrasound guidance a 6 Fr Safe-T-Centesis catheter was introduced. Thoracentesis was performed. The catheter was removed and a dressing applied. FINDINGS: A total of approximately 475 cc of amber fluid was removed. IMPRESSION: Successful ultrasound guided therapeutic right thoracentesis yielding 475 cc of pleural fluid. Read by: Rowe Robert, PA-C Electronically Signed   By: Sandi Mariscal M.D.   On: 02/04/2021 11:10    ASSESSMENT AND PLAN: This is a very pleasant 70 years old white male recently diagnosed with extensive stage (T4, N3, M1 C) small cell lung cancer presented with large right upper lobe lung mass in addition to mediastinal and right supraclavicular lymphadenopathy and multiple metastatic liver lesions diagnosed in April 2021. The patient underwent systemic chemotherapy with carboplatin for AUC of 5 on day 1, etoposide 100 mg/M2 on days 1, 2 and 3 in addition to Cosela for myeloprotection during the days of the chemotherapy.  He will also receive immunotherapy with Imfinzi on day one of the chemotherapy cycle.  Status post 9 cycles.  Starting from cycle #5 he is on maintenance treatment with Imfinzi 1500 mg IV every 4 weeks. The  patient has been tolerating this treatment well with no concerning adverse effect except for the worsening cough and shortness of breath recently.   He had repeat CT scan of the chest, abdomen pelvis performed recently.  I personally and independently reviewed the scan images and discussed the results with the patient and his wife today. Unfortunately his scan showed evidence for disease progression with increase in the right pleural effusion as well as enlargement of multiple liver lesions. I recommended for the patient to discontinue his current maintenance treatment with Imfinzi at this point. He resumed systemic chemotherapy with carboplatin for AUC of 5 on day 1, etoposide 100 mg/M2 on days 1, 2 and 3 with Tecentriq 1200 mg every 3 weeks as well as Cosela on the days of the chemotherapy.  He is status post 3 cycles.   The patient has been tolerating this treatment well except for  fatigue. I recommended for him to proceed with cycle #4 today as planned. I will see him back for follow-up visit in 3 weeks for evaluation with repeat CT scan of the chest, abdomen pelvis for restaging of his disease after cycle #4 and before proceeding with maintenance therapy. For the cough, I will give him refill of Tussionex today. The patient was advised to call immediately if he has any other concerning symptoms in the interval.  The patient voices understanding of current disease status and treatment options and is in agreement with the current care plan.  All questions were answered. The patient knows to call the clinic with any problems, questions or concerns. We can certainly see the patient much sooner if necessary.  Disclaimer: This note was dictated with voice recognition software. Similar sounding words can inadvertently be transcribed and may not be corrected upon review.

## 2021-02-20 NOTE — Patient Instructions (Signed)
Implanted Port Insertion, Care After This sheet gives you information about how to care for yourself after your procedure. Your health care provider may also give you more specific instructions. If you have problems or questions, contact your health care provider. What can I expect after the procedure? After the procedure, it is common to have:  Discomfort at the port insertion site.  Bruising on the skin over the port. This should improve over 3-4 days. Follow these instructions at home: Port care  After your port is placed, you will get a manufacturer's information card. The card has information about your port. Keep this card with you at all times.  Take care of the port as told by your health care provider. Ask your health care provider if you or a family member can get training for taking care of the port at home. A home health care nurse may also take care of the port.  Make sure to remember what type of port you have. Incision care  Follow instructions from your health care provider about how to take care of your port insertion site. Make sure you: ? Wash your hands with soap and water before and after you change your bandage (dressing). If soap and water are not available, use hand sanitizer. ? Change your dressing as told by your health care provider. ? Leave stitches (sutures), skin glue, or adhesive strips in place. These skin closures may need to stay in place for 2 weeks or longer. If adhesive strip edges start to loosen and curl up, you may trim the loose edges. Do not remove adhesive strips completely unless your health care provider tells you to do that.  Check your port insertion site every day for signs of infection. Check for: ? Redness, swelling, or pain. ? Fluid or blood. ? Warmth. ? Pus or a bad smell.      Activity  Return to your normal activities as told by your health care provider. Ask your health care provider what activities are safe for you.  Do not  lift anything that is heavier than 10 lb (4.5 kg), or the limit that you are told, until your health care provider says that it is safe. General instructions  Take over-the-counter and prescription medicines only as told by your health care provider.  Do not take baths, swim, or use a hot tub until your health care provider approves. Ask your health care provider if you may take showers. You may only be allowed to take sponge baths.  Do not drive for 24 hours if you were given a sedative during your procedure.  Wear a medical alert bracelet in case of an emergency. This will tell any health care providers that you have a port.  Keep all follow-up visits as told by your health care provider. This is important. Contact a health care provider if:  You cannot flush your port with saline as directed, or you cannot draw blood from the port.  You have a fever or chills.  You have redness, swelling, or pain around your port insertion site.  You have fluid or blood coming from your port insertion site.  Your port insertion site feels warm to the touch.  You have pus or a bad smell coming from the port insertion site. Get help right away if:  You have chest pain or shortness of breath.  You have bleeding from your port that you cannot control. Summary  Take care of the port as told by your   health care provider. Keep the manufacturer's information card with you at all times.  Change your dressing as told by your health care provider.  Contact a health care provider if you have a fever or chills or if you have redness, swelling, or pain around your port insertion site.  Keep all follow-up visits as told by your health care provider. This information is not intended to replace advice given to you by your health care provider. Make sure you discuss any questions you have with your health care provider. Document Revised: 06/29/2018 Document Reviewed: 06/29/2018 Elsevier Patient Education   2021 [employer]  

## 2021-02-21 ENCOUNTER — Inpatient Hospital Stay: Payer: Medicare Other

## 2021-02-21 VITALS — BP 132/82 | HR 103 | Temp 98.7°F | Resp 20

## 2021-02-21 DIAGNOSIS — C3411 Malignant neoplasm of upper lobe, right bronchus or lung: Secondary | ICD-10-CM | POA: Diagnosis not present

## 2021-02-21 DIAGNOSIS — R5383 Other fatigue: Secondary | ICD-10-CM | POA: Diagnosis not present

## 2021-02-21 DIAGNOSIS — Z5111 Encounter for antineoplastic chemotherapy: Secondary | ICD-10-CM | POA: Diagnosis not present

## 2021-02-21 DIAGNOSIS — Z79899 Other long term (current) drug therapy: Secondary | ICD-10-CM | POA: Diagnosis not present

## 2021-02-21 DIAGNOSIS — Z5112 Encounter for antineoplastic immunotherapy: Secondary | ICD-10-CM | POA: Diagnosis not present

## 2021-02-21 DIAGNOSIS — Z5189 Encounter for other specified aftercare: Secondary | ICD-10-CM | POA: Diagnosis not present

## 2021-02-21 MED ORDER — SODIUM CHLORIDE 0.9 % IV SOLN
10.0000 mg | Freq: Once | INTRAVENOUS | Status: AC
  Administered 2021-02-21: 10 mg via INTRAVENOUS
  Filled 2021-02-21: qty 10

## 2021-02-21 MED ORDER — SODIUM CHLORIDE 0.9% FLUSH
10.0000 mL | INTRAVENOUS | Status: DC | PRN
  Administered 2021-02-21: 10 mL
  Filled 2021-02-21: qty 10

## 2021-02-21 MED ORDER — SODIUM CHLORIDE 0.9 % IV SOLN
Freq: Once | INTRAVENOUS | Status: AC
  Filled 2021-02-21: qty 250

## 2021-02-21 MED ORDER — TRILACICLIB DIHYDROCHLORIDE INJECTION 300 MG
240.0000 mg/m2 | Freq: Once | INTRAVENOUS | Status: AC
  Administered 2021-02-21: 600 mg via INTRAVENOUS
  Filled 2021-02-21: qty 40

## 2021-02-21 MED ORDER — HEPARIN SOD (PORK) LOCK FLUSH 100 UNIT/ML IV SOLN
500.0000 [IU] | Freq: Once | INTRAVENOUS | Status: AC | PRN
  Administered 2021-02-21: 500 [IU]
  Filled 2021-02-21: qty 5

## 2021-02-21 MED ORDER — SODIUM CHLORIDE 0.9 % IV SOLN
100.0000 mg/m2 | Freq: Once | INTRAVENOUS | Status: AC
  Administered 2021-02-21: 250 mg via INTRAVENOUS
  Filled 2021-02-21: qty 12.5

## 2021-02-21 NOTE — Patient Instructions (Signed)
[facility] Discharge Instructions for Patients Receiving Chemotherapy  Today you received the following chemotherapy agents: Cosela, Etoposide  To help prevent nausea and vomiting after your treatment, we encourage you to take your nausea medication as directed.    If you develop nausea and vomiting that is not controlled by your nausea medication, call the clinic.   BELOW ARE SYMPTOMS THAT SHOULD BE REPORTED IMMEDIATELY:  *FEVER GREATER THAN 100.5 F  *CHILLS WITH OR WITHOUT FEVER  NAUSEA AND VOMITING THAT IS NOT CONTROLLED WITH YOUR NAUSEA MEDICATION  *UNUSUAL SHORTNESS OF BREATH  *UNUSUAL BRUISING OR BLEEDING  TENDERNESS IN MOUTH AND THROAT WITH OR WITHOUT PRESENCE OF ULCERS  *URINARY PROBLEMS  *BOWEL PROBLEMS  UNUSUAL RASH Items with * indicate a potential emergency and should be followed up as soon as possible.  Feel free to call the clinic should you have any questions or concerns. The clinic phone number is [phone].  Please show the [facility] at check-in to the Emergency Department and triage nurse.

## 2021-02-21 NOTE — Progress Notes (Signed)
Per C. Heillingoetter PA ok to treat today with HR of [id number].

## 2021-02-22 ENCOUNTER — Telehealth: Payer: Self-pay | Admitting: Internal Medicine

## 2021-02-22 ENCOUNTER — Other Ambulatory Visit: Payer: Self-pay

## 2021-02-22 ENCOUNTER — Inpatient Hospital Stay: Payer: Medicare Other

## 2021-02-22 VITALS — BP 149/92 | HR 97 | Temp 97.9°F | Resp 20

## 2021-02-22 DIAGNOSIS — R5383 Other fatigue: Secondary | ICD-10-CM | POA: Diagnosis not present

## 2021-02-22 DIAGNOSIS — Z5112 Encounter for antineoplastic immunotherapy: Secondary | ICD-10-CM | POA: Diagnosis not present

## 2021-02-22 DIAGNOSIS — C3411 Malignant neoplasm of upper lobe, right bronchus or lung: Secondary | ICD-10-CM | POA: Diagnosis not present

## 2021-02-22 DIAGNOSIS — Z5189 Encounter for other specified aftercare: Secondary | ICD-10-CM | POA: Diagnosis not present

## 2021-02-22 DIAGNOSIS — Z5111 Encounter for antineoplastic chemotherapy: Secondary | ICD-10-CM | POA: Diagnosis not present

## 2021-02-22 DIAGNOSIS — Z79899 Other long term (current) drug therapy: Secondary | ICD-10-CM | POA: Diagnosis not present

## 2021-02-22 MED ORDER — PEGFILGRASTIM 6 MG/0.6ML ~~LOC~~ PSKT
PREFILLED_SYRINGE | SUBCUTANEOUS | Status: AC
  Filled 2021-02-22: qty 0.6

## 2021-02-22 MED ORDER — PEGFILGRASTIM 6 MG/0.6ML ~~LOC~~ PSKT
6.0000 mg | PREFILLED_SYRINGE | Freq: Once | SUBCUTANEOUS | Status: AC
  Administered 2021-02-22: 6 mg via SUBCUTANEOUS

## 2021-02-22 MED ORDER — SODIUM CHLORIDE 0.9 % IV SOLN
10.0000 mg | Freq: Once | INTRAVENOUS | Status: AC
  Administered 2021-02-22: 10 mg via INTRAVENOUS
  Filled 2021-02-22: qty 10

## 2021-02-22 MED ORDER — SODIUM CHLORIDE 0.9 % IV SOLN
100.0000 mg/m2 | Freq: Once | INTRAVENOUS | Status: AC
  Administered 2021-02-22: 250 mg via INTRAVENOUS
  Filled 2021-02-22: qty 12.5

## 2021-02-22 MED ORDER — SODIUM CHLORIDE 0.9% FLUSH
10.0000 mL | INTRAVENOUS | Status: DC | PRN
  Administered 2021-02-22: 10 mL
  Filled 2021-02-22: qty 10

## 2021-02-22 MED ORDER — HEPARIN SOD (PORK) LOCK FLUSH 100 UNIT/ML IV SOLN
500.0000 [IU] | Freq: Once | INTRAVENOUS | Status: AC | PRN
  Administered 2021-02-22: 500 [IU]
  Filled 2021-02-22: qty 5

## 2021-02-22 MED ORDER — TRILACICLIB DIHYDROCHLORIDE INJECTION 300 MG
240.0000 mg/m2 | Freq: Once | INTRAVENOUS | Status: AC
  Administered 2021-02-22: 600 mg via INTRAVENOUS
  Filled 2021-02-22: qty 40

## 2021-02-22 MED ORDER — SODIUM CHLORIDE 0.9 % IV SOLN
Freq: Once | INTRAVENOUS | Status: AC
  Filled 2021-02-22: qty 250

## 2021-02-22 NOTE — Telephone Encounter (Signed)
Scheduled per los. Patient given printout in infusion

## 2021-02-22 NOTE — Patient Instructions (Signed)
[facility] Discharge Instructions for Patients Receiving Chemotherapy  Today you received the following chemotherapy agents: etoposide  To help prevent nausea and vomiting after your treatment, we encourage you to take your nausea medication as directed.   If you develop nausea and vomiting that is not controlled by your nausea medication, call the clinic.   BELOW ARE SYMPTOMS THAT SHOULD BE REPORTED IMMEDIATELY:  *FEVER GREATER THAN 100.5 F  *CHILLS WITH OR WITHOUT FEVER  NAUSEA AND VOMITING THAT IS NOT CONTROLLED WITH YOUR NAUSEA MEDICATION  *UNUSUAL SHORTNESS OF BREATH  *UNUSUAL BRUISING OR BLEEDING  TENDERNESS IN MOUTH AND THROAT WITH OR WITHOUT PRESENCE OF ULCERS  *URINARY PROBLEMS  *BOWEL PROBLEMS  UNUSUAL RASH Items with * indicate a potential emergency and should be followed up as soon as possible.  Feel free to call the clinic should you have any questions or concerns. The clinic phone number is [phone].  Please show the CHEMO ALERT CARD at check-in to the Emergency Department and triage nurse.   

## 2021-02-27 ENCOUNTER — Other Ambulatory Visit: Payer: Self-pay

## 2021-02-27 ENCOUNTER — Inpatient Hospital Stay: Payer: Medicare Other

## 2021-02-27 ENCOUNTER — Other Ambulatory Visit: Payer: Medicare Other

## 2021-02-27 DIAGNOSIS — Z5189 Encounter for other specified aftercare: Secondary | ICD-10-CM | POA: Diagnosis not present

## 2021-02-27 DIAGNOSIS — C3411 Malignant neoplasm of upper lobe, right bronchus or lung: Secondary | ICD-10-CM | POA: Diagnosis not present

## 2021-02-27 DIAGNOSIS — R5383 Other fatigue: Secondary | ICD-10-CM | POA: Diagnosis not present

## 2021-02-27 DIAGNOSIS — Z5112 Encounter for antineoplastic immunotherapy: Secondary | ICD-10-CM | POA: Diagnosis not present

## 2021-02-27 DIAGNOSIS — Z5111 Encounter for antineoplastic chemotherapy: Secondary | ICD-10-CM | POA: Diagnosis not present

## 2021-02-27 DIAGNOSIS — Z79899 Other long term (current) drug therapy: Secondary | ICD-10-CM | POA: Diagnosis not present

## 2021-02-27 LAB — CBC WITH DIFFERENTIAL (CANCER CENTER ONLY)
Abs Immature Granulocytes: 0.1 10*3/uL — ABNORMAL HIGH (ref 0.00–0.07)
Band Neutrophils: 5 %
Basophils Absolute: 0.1 10*3/uL (ref 0.0–0.1)
Basophils Relative: 1 %
Eosinophils Absolute: 0.1 10*3/uL (ref 0.0–0.5)
Eosinophils Relative: 1 %
HCT: 35.9 % — ABNORMAL LOW (ref 39.0–52.0)
Hemoglobin: 11.6 g/dL — ABNORMAL LOW (ref 13.0–17.0)
Lymphocytes Relative: 7 %
Lymphs Abs: 0.8 10*3/uL (ref 0.7–4.0)
MCH: 30.1 pg (ref 26.0–34.0)
MCHC: 32.3 g/dL (ref 30.0–36.0)
MCV: 93.2 fL (ref 80.0–100.0)
Metamyelocytes Relative: 1 %
Monocytes Absolute: 0.2 10*3/uL (ref 0.1–1.0)
Monocytes Relative: 2 %
Neutro Abs: 10.4 10*3/uL — ABNORMAL HIGH (ref 1.7–7.7)
Neutrophils Relative %: 83 %
Platelet Count: 125 10*3/uL — ABNORMAL LOW (ref 150–400)
RBC: 3.85 MIL/uL — ABNORMAL LOW (ref 4.22–5.81)
RDW: 17.8 % — ABNORMAL HIGH (ref 11.5–15.5)
WBC Count: 11.8 10*3/uL — ABNORMAL HIGH (ref 4.0–10.5)
nRBC: 0 % (ref 0.0–0.2)

## 2021-02-27 LAB — CMP (CANCER CENTER ONLY)
ALT: 16 U/L (ref 0–44)
AST: 15 U/L (ref 15–41)
Albumin: 3.8 g/dL (ref 3.5–5.0)
Alkaline Phosphatase: 119 U/L (ref 38–126)
Anion gap: 7 (ref 5–15)
BUN: 17 mg/dL (ref 8–23)
CO2: 27 mmol/L (ref 22–32)
Calcium: 8.7 mg/dL — ABNORMAL LOW (ref 8.9–10.3)
Chloride: 103 mmol/L (ref 98–111)
Creatinine: 1.04 mg/dL (ref 0.61–1.24)
GFR, Estimated: >60 mL/min (ref >60)
Glucose, Bld: 181 mg/dL — ABNORMAL HIGH (ref 70–99)
Potassium: 4.2 mmol/L (ref 3.5–5.1)
Sodium: 137 mmol/L (ref 135–145)
Total Bilirubin: 0.4 mg/dL (ref 0.3–1.2)
Total Protein: 6.3 g/dL — ABNORMAL LOW (ref 6.5–8.1)

## 2021-03-06 ENCOUNTER — Inpatient Hospital Stay: Payer: Medicare Other

## 2021-03-06 ENCOUNTER — Other Ambulatory Visit: Payer: Self-pay

## 2021-03-06 ENCOUNTER — Other Ambulatory Visit: Payer: Medicare Other

## 2021-03-06 DIAGNOSIS — C3411 Malignant neoplasm of upper lobe, right bronchus or lung: Secondary | ICD-10-CM | POA: Diagnosis not present

## 2021-03-06 DIAGNOSIS — Z5111 Encounter for antineoplastic chemotherapy: Secondary | ICD-10-CM | POA: Diagnosis not present

## 2021-03-06 DIAGNOSIS — Z5112 Encounter for antineoplastic immunotherapy: Secondary | ICD-10-CM | POA: Diagnosis not present

## 2021-03-06 DIAGNOSIS — R5382 Chronic fatigue, unspecified: Secondary | ICD-10-CM

## 2021-03-06 DIAGNOSIS — Z5189 Encounter for other specified aftercare: Secondary | ICD-10-CM | POA: Diagnosis not present

## 2021-03-06 DIAGNOSIS — Z79899 Other long term (current) drug therapy: Secondary | ICD-10-CM | POA: Diagnosis not present

## 2021-03-06 DIAGNOSIS — R5383 Other fatigue: Secondary | ICD-10-CM | POA: Diagnosis not present

## 2021-03-06 LAB — CMP (CANCER CENTER ONLY)
ALT: 23 U/L (ref 0–44)
AST: 21 U/L (ref 15–41)
Albumin: 3.9 g/dL (ref 3.5–5.0)
Alkaline Phosphatase: 106 U/L (ref 38–126)
Anion gap: 11 (ref 5–15)
BUN: 11 mg/dL (ref 8–23)
CO2: 28 mmol/L (ref 22–32)
Calcium: 8.7 mg/dL — ABNORMAL LOW (ref 8.9–10.3)
Chloride: 104 mmol/L (ref 98–111)
Creatinine: 1.3 mg/dL — ABNORMAL HIGH (ref 0.61–1.24)
GFR, Estimated: 59 mL/min — ABNORMAL LOW (ref >60)
Glucose, Bld: 174 mg/dL — ABNORMAL HIGH (ref 70–99)
Potassium: 4.6 mmol/L (ref 3.5–5.1)
Sodium: 143 mmol/L (ref 135–145)
Total Bilirubin: 0.3 mg/dL (ref 0.3–1.2)
Total Protein: 6.4 g/dL — ABNORMAL LOW (ref 6.5–8.1)

## 2021-03-06 LAB — CBC WITH DIFFERENTIAL (CANCER CENTER ONLY)
Abs Immature Granulocytes: 0.07 10*3/uL (ref 0.00–0.07)
Basophils Absolute: 0.1 10*3/uL (ref 0.0–0.1)
Basophils Relative: 1 %
Eosinophils Absolute: 0.1 10*3/uL (ref 0.0–0.5)
Eosinophils Relative: 1 %
HCT: 36 % — ABNORMAL LOW (ref 39.0–52.0)
Hemoglobin: 11.5 g/dL — ABNORMAL LOW (ref 13.0–17.0)
Immature Granulocytes: 1 %
Lymphocytes Relative: 11 %
Lymphs Abs: 0.7 10*3/uL (ref 0.7–4.0)
MCH: 30 pg (ref 26.0–34.0)
MCHC: 31.9 g/dL (ref 30.0–36.0)
MCV: 94 fL (ref 80.0–100.0)
Monocytes Absolute: 0.5 10*3/uL (ref 0.1–1.0)
Monocytes Relative: 8 %
Neutro Abs: 4.6 10*3/uL (ref 1.7–7.7)
Neutrophils Relative %: 78 %
Platelet Count: 64 10*3/uL — ABNORMAL LOW (ref 150–400)
RBC: 3.83 MIL/uL — ABNORMAL LOW (ref 4.22–5.81)
RDW: 19.1 % — ABNORMAL HIGH (ref 11.5–15.5)
WBC Count: 6 10*3/uL (ref 4.0–10.5)
nRBC: 0 % (ref 0.0–0.2)

## 2021-03-06 LAB — TSH: TSH: 0.807 u[IU]/mL (ref 0.320–4.118)

## 2021-03-11 ENCOUNTER — Other Ambulatory Visit: Payer: Self-pay

## 2021-03-11 ENCOUNTER — Ambulatory Visit (HOSPITAL_COMMUNITY)
Admission: RE | Admit: 2021-03-11 | Discharge: 2021-03-11 | Disposition: A | Discharge status: Home / Self Care | Payer: Medicare Other | Source: Ambulatory Visit | Attending: Internal Medicine | Admitting: Internal Medicine

## 2021-03-11 DIAGNOSIS — N281 Cyst of kidney, acquired: Secondary | ICD-10-CM | POA: Diagnosis not present

## 2021-03-11 DIAGNOSIS — C3490 Malignant neoplasm of unspecified part of unspecified bronchus or lung: Secondary | ICD-10-CM | POA: Insufficient documentation

## 2021-03-11 DIAGNOSIS — C787 Secondary malignant neoplasm of liver and intrahepatic bile duct: Secondary | ICD-10-CM | POA: Diagnosis not present

## 2021-03-11 DIAGNOSIS — E278 Other specified disorders of adrenal gland: Secondary | ICD-10-CM | POA: Diagnosis not present

## 2021-03-11 DIAGNOSIS — M47814 Spondylosis without myelopathy or radiculopathy, thoracic region: Secondary | ICD-10-CM | POA: Diagnosis not present

## 2021-03-11 DIAGNOSIS — I2510 Atherosclerotic heart disease of native coronary artery without angina pectoris: Secondary | ICD-10-CM | POA: Diagnosis not present

## 2021-03-11 DIAGNOSIS — J90 Pleural effusion, not elsewhere classified: Secondary | ICD-10-CM | POA: Diagnosis not present

## 2021-03-11 MED ORDER — HEPARIN SOD (PORK) LOCK FLUSH 100 UNIT/ML IV SOLN: 500.0000 [IU] | Freq: Once | INTRAVENOUS | Status: DC

## 2021-03-11 MED ORDER — HEPARIN SOD (PORK) LOCK FLUSH 100 UNIT/ML IV SOLN
INTRAVENOUS | Status: AC
  Administered 2021-03-11: 500 [IU]
  Filled 2021-03-11: qty 5

## 2021-03-11 MED ORDER — IOHEXOL 300 MG/ML  SOLN
100.0000 mL | Freq: Once | INTRAMUSCULAR | Status: AC | PRN
  Administered 2021-03-11: 100 mL via INTRAVENOUS

## 2021-03-13 ENCOUNTER — Inpatient Hospital Stay: Payer: Medicare Other

## 2021-03-13 ENCOUNTER — Other Ambulatory Visit: Payer: Self-pay

## 2021-03-13 ENCOUNTER — Inpatient Hospital Stay (HOSPITAL_BASED_OUTPATIENT_CLINIC_OR_DEPARTMENT_OTHER): Payer: Medicare Other | Admitting: Internal Medicine

## 2021-03-13 VITALS — HR 98 | Temp 97.7°F | Resp 18 | Wt 284.5 lb

## 2021-03-13 DIAGNOSIS — Z5111 Encounter for antineoplastic chemotherapy: Secondary | ICD-10-CM | POA: Diagnosis not present

## 2021-03-13 DIAGNOSIS — C3411 Malignant neoplasm of upper lobe, right bronchus or lung: Secondary | ICD-10-CM

## 2021-03-13 DIAGNOSIS — Z5112 Encounter for antineoplastic immunotherapy: Secondary | ICD-10-CM | POA: Diagnosis not present

## 2021-03-13 DIAGNOSIS — Z95828 Presence of other vascular implants and grafts: Secondary | ICD-10-CM

## 2021-03-13 DIAGNOSIS — R5383 Other fatigue: Secondary | ICD-10-CM | POA: Diagnosis not present

## 2021-03-13 DIAGNOSIS — Z5189 Encounter for other specified aftercare: Secondary | ICD-10-CM | POA: Diagnosis not present

## 2021-03-13 DIAGNOSIS — Z79899 Other long term (current) drug therapy: Secondary | ICD-10-CM | POA: Diagnosis not present

## 2021-03-13 LAB — CMP (CANCER CENTER ONLY)
ALT: 18 U/L (ref 0–44)
AST: 20 U/L (ref 15–41)
Albumin: 3.8 g/dL (ref 3.5–5.0)
Alkaline Phosphatase: 82 U/L (ref 38–126)
Anion gap: 9 (ref 5–15)
BUN: 11 mg/dL (ref 8–23)
CO2: 26 mmol/L (ref 22–32)
Calcium: 8.4 mg/dL — ABNORMAL LOW (ref 8.9–10.3)
Chloride: 104 mmol/L (ref 98–111)
Creatinine: 1.15 mg/dL (ref 0.61–1.24)
GFR, Estimated: >60 mL/min (ref >60)
Glucose, Bld: 181 mg/dL — ABNORMAL HIGH (ref 70–99)
Potassium: 4.4 mmol/L (ref 3.5–5.1)
Sodium: 139 mmol/L (ref 135–145)
Total Bilirubin: 0.3 mg/dL (ref 0.3–1.2)
Total Protein: 6.2 g/dL — ABNORMAL LOW (ref 6.5–8.1)

## 2021-03-13 LAB — CBC WITH DIFFERENTIAL (CANCER CENTER ONLY)
Abs Immature Granulocytes: 0.08 10*3/uL — ABNORMAL HIGH (ref 0.00–0.07)
Basophils Absolute: 0.1 10*3/uL (ref 0.0–0.1)
Basophils Relative: 1 %
Eosinophils Absolute: 0.1 10*3/uL (ref 0.0–0.5)
Eosinophils Relative: 2 %
HCT: 37.2 % — ABNORMAL LOW (ref 39.0–52.0)
Hemoglobin: 12.2 g/dL — ABNORMAL LOW (ref 13.0–17.0)
Immature Granulocytes: 1 %
Lymphocytes Relative: 10 %
Lymphs Abs: 0.6 10*3/uL — ABNORMAL LOW (ref 0.7–4.0)
MCH: 30.5 pg (ref 26.0–34.0)
MCHC: 32.8 g/dL (ref 30.0–36.0)
MCV: 93 fL (ref 80.0–100.0)
Monocytes Absolute: 0.7 10*3/uL (ref 0.1–1.0)
Monocytes Relative: 11 %
Neutro Abs: 4.6 10*3/uL (ref 1.7–7.7)
Neutrophils Relative %: 75 %
Platelet Count: 202 10*3/uL (ref 150–400)
RBC: 4 MIL/uL — ABNORMAL LOW (ref 4.22–5.81)
RDW: 18.4 % — ABNORMAL HIGH (ref 11.5–15.5)
WBC Count: 6.1 10*3/uL (ref 4.0–10.5)
nRBC: 0 % (ref 0.0–0.2)

## 2021-03-13 MED ORDER — SODIUM CHLORIDE 0.9% FLUSH
10.0000 mL | Freq: Once | INTRAVENOUS | Status: AC
  Administered 2021-03-13: 10 mL
  Filled 2021-03-13: qty 10

## 2021-03-13 MED ORDER — SODIUM CHLORIDE 0.9 % IV SOLN
Freq: Once | INTRAVENOUS | Status: AC
  Filled 2021-03-13: qty 250

## 2021-03-13 MED ORDER — SODIUM CHLORIDE 0.9 % IV SOLN
1200.0000 mg | Freq: Once | INTRAVENOUS | Status: AC
  Administered 2021-03-13: 1200 mg via INTRAVENOUS
  Filled 2021-03-13: qty 20

## 2021-03-13 NOTE — Progress Notes (Signed)
[place] [phone]   [phone]  OFFICE PROGRESS NOTE  [name], NP [place] [address]  DIAGNOSIS: Relapsed extensive stage (T4, N3, M1c)  small cell lung cancer diagnosed in April 2021 and presented with large right upper lobe/right hilar mass with ipsilateral and contralateral mediastinal and right supraclavicular lymphadenopathy in addition to multiple liver lesios. He has disease progression after the first line of chemotherapy in December 2021.  PRIOR THERAPY:  1) Palliative radiotherapy to the right upper lobe lung mass under the care of Dr. Lisbeth Renshaw. 2) Systemic chemotherapy with carboplatin for AUC of 5 on day 1, etoposide 100 mg/M2 on days 1, 2 and 3 in addition to Imfinzi 1500 mg IV every 3 weeks with chemotherapy then every 4 weeks for maintenance if the patient has no evidence for progression.  He will also receive Cosela 240 mg/m2 on the days of the chemotherapy.  Status post 9 cycles.  Starting from cycle #5 the patient will be on maintenance treatment with immunotherapy with Imfinzi 1500 mg IV every 4 weeks. Last dose of chemotherapy was given on November 13, 2020. This treatment was discontinued secondary to disease progression.  CURRENT THERAPY: Systemic chemotherapy with carboplatin for AUC of 5 on day 1, etoposide 100 mg/M2 on days 1, 2 and 3, Tecentriq 1200 mg IV every 3 weeks as well as Cosela 250 mg/M2 on the days of the chemotherapy every 3 weeks.  First dose December 18, 2020.  Status post 4 cycles.  INTERVAL HISTORY: Adam Wise 70 y.o. male returns to the clinic today for follow-up visit accompanied by his wife.  The patient is feeling fine today with no concerning complaints except for the baseline fatigue and shortness of breath with exertion.  He underwent ultrasound-guided thoracentesis with drainage of right pleural effusion few weeks ago.  The patient denied having any current chest pain, cough or  hemoptysis.  He denied having any nausea, vomiting, diarrhea or constipation.  He has no headache or visual changes.  He has repeat CT scan of the chest, abdomen pelvis performed recently and he is here for evaluation and discussion of his scan results.  MEDICAL HISTORY: Past Medical History:  Diagnosis Date  . Cancer ([facility])   . Chickenpox   . Chronic knee pain   . Chronic low back pain   . Essential hypertension   . GERD (gastroesophageal reflux disease)   . OSA (obstructive sleep apnea)   . Testosterone deficiency   . Type 2 diabetes mellitus ([facility])     ALLERGIES:  is allergic to bupropion.  MEDICATIONS:  Current Outpatient Medications  Medication Sig Dispense Refill  . albuterol (VENTOLIN HFA) 108 (90 Base) MCG/ACT inhaler Inhale 2 puffs into the lungs every 6 (six) hours as needed for wheezing or shortness of breath. 18 g 0  . amLODipine (NORVASC) 10 MG tablet TAKE 1 TABLET BY MOUTH EVERY DAY 90 tablet 1  . Black Pepper-Turmeric (TURMERIC COMPLEX/BLACK PEPPER PO) Take 1 tablet by mouth in the morning and at bedtime.    . blood glucose meter kit and supplies KIT Dispense based on patient and insurance preference. Use up to four times daily as directed. 1 each 0  . chlorpheniramine-HYDROcodone (TUSSIONEX) 10-8 MG/5ML SUER Take 5 mLs by mouth at bedtime as needed for cough. 140 mL 0  . Coenzyme Q10 ([employer] COQ-10) 200 MG capsule Take 200 mg by mouth every evening.     Marland Kitchen  ELIQUIS 5 MG TABS tablet TAKE 1 TABLET BY MOUTH TWICE A DAY 180 tablet 2  . esomeprazole (NEXIUM) 20 MG capsule Take 20 mg by mouth daily.     . fluticasone (FLONASE) 50 MCG/ACT nasal spray Place 2 sprays into both nostrils daily as needed for allergies.    Marland Kitchen gabapentin (NEURONTIN) 300 MG capsule Take 300 mg by mouth 2 (two) times daily.     Marland Kitchen glipiZIDE (GLUCOTROL) 10 MG tablet TAKE 1 TABLET (10 MG TOTAL) BY MOUTH 2 (TWO) TIMES DAILY BEFORE A MEAL. FOR DIABETES. 180 tablet 2  . glucose blood (ONETOUCH ULTRA) test strip USE  UP TO 4 TIMES DAILY AS DIRECTED 100 strip 5  . Insulin Glargine (BASAGLAR KWIKPEN) 100 UNIT/ML Inject 20 Units into the skin at bedtime. For diabetes. 15 mL 3  . Insulin Pen Needle (BD PEN NEEDLE NANO U/F) 32G X 4 MM MISC Use with insulin as prescribed  Dx Code: E11.9 100 each 2  . KRILL OIL PO Take 350 mg by mouth daily.    . Lancets MISC USE UP TO 3 TIMES DAILY AS DIRECTED 100 each 2  . lidocaine-prilocaine (EMLA) cream Apply 1 application topically as needed. 30 g 2  . lisinopril (ZESTRIL) 5 MG tablet TAKE 1 TABLET BY MOUTH EVERY DAY 90 tablet 1  . Melatonin 10 MG CAPS Take 10 mg by mouth at bedtime.    . metFORMIN (GLUCOPHAGE) 1000 MG tablet TAKE 1 TABLET (1,000 MG TOTAL) BY MOUTH 2 (TWO) TIMES DAILY WITH A MEAL. 180 tablet 0  . Multiple Vitamin (MULTI-VITAMINS) TABS Take 1 tablet by mouth daily.     Marland Kitchen OVER THE COUNTER MEDICATION Take by mouth.    Marland Kitchen oxymetazoline (AFRIN) 0.05 % nasal spray Place 1 spray into both nostrils 2 (two) times daily as needed for congestion.    . pioglitazone (ACTOS) 45 MG tablet Take 1 tablet (45 mg total) by mouth daily. For diabetes. 90 tablet 2  . pravastatin (PRAVACHOL) 40 MG tablet TAKE 1 TABLET BY MOUTH EVERY DAY IN THE EVENING FOR CHOLESTEROL 90 tablet 1  . prochlorperazine (COMPAZINE) 10 MG tablet Take 1 tablet (10 mg total) by mouth every 6 (six) hours as needed for nausea or vomiting. 30 tablet 0  . sildenafil (REVATIO) 20 MG tablet Take 3-5 tablets by mouth 1 hour prior to intercourse as needed. 50 tablet 0  . SYRINGE-NEEDLE, DISP, 3 ML (B-D 3CC LUER-LOK SYR 22GX1") 22G X 1" 3 ML MISC USE AS INSTRUCTED FOR TESTOSTERONE INJECTION EVERY 2 WEEKS 10 each 2  . testosterone cypionate (DEPOTESTOSTERONE CYPIONATE) 200 MG/ML injection Inject 200 mg into the muscle every 14 (fourteen) days.     No current facility-administered medications for this visit.    SURGICAL HISTORY:  Past Surgical History:  Procedure Laterality Date  . COLONOSCOPY WITH PROPOFOL N/A  12/17/2018   Procedure: COLONOSCOPY WITH PROPOFOL;  Surgeon: Virgel Manifold, MD;  Location: [facility] ENDOSCOPY;  Service: Gastroenterology;  Laterality: N/A;  . IR IMAGING GUIDED PORT INSERTION  04/17/2020  . JOINT REPLACEMENT Bilateral   . REPLACEMENT TOTAL KNEE BILATERAL  2015  . TONSILLECTOMY  1960    REVIEW OF SYSTEMS:  Constitutional: positive for fatigue Eyes: negative Ears, nose, mouth, throat, and face: negative Respiratory: positive for dyspnea on exertion Cardiovascular: negative Gastrointestinal: negative Genitourinary:negative Integument/breast: negative Hematologic/lymphatic: negative Musculoskeletal:negative Neurological: negative Behavioral/Psych: negative Endocrine: negative Allergic/Immunologic: negative   PHYSICAL EXAMINATION: General appearance: alert, cooperative, fatigued and no distress Head: atraumatic Neck: no adenopathy,  no JVD, supple, symmetrical, trachea midline and thyroid not enlarged, symmetric, no tenderness/mass/nodules Lymph nodes: Cervical, supraclavicular, and axillary nodes normal. Resp: diminished breath sounds RLL and dullness to percussion RLL Back: symmetric, no curvature. ROM normal. No CVA tenderness. Cardio: regular rate and rhythm, S1, S2 normal, no murmur, click, rub or gallop GI: soft, non-tender; bowel sounds normal; no masses,  no organomegaly Extremities: extremities normal, atraumatic, no cyanosis or edema Neurologic: Alert and oriented X 3, normal strength and tone. Normal symmetric reflexes. Normal coordination and gait  ECOG PERFORMANCE STATUS: 1 - Symptomatic but completely ambulatory  Pulse 98, temperature 97.7 F (36.5 C), temperature source Tympanic, resp. rate 18, weight 284 lb 8 oz (129 kg), SpO2 98 %.  LABORATORY DATA: Lab Results  Component Value Date   WBC 6.1 03/13/2021   HGB 12.2 (L) 03/13/2021   HCT 37.2 (L) 03/13/2021   MCV 93.0 03/13/2021   PLT 202 03/13/2021      Chemistry      Component Value  Date/Time   NA 139 03/13/2021 0939   K 4.4 03/13/2021 0939   CL 104 03/13/2021 0939   CO2 26 03/13/2021 0939   BUN 11 03/13/2021 0939   CREATININE 1.15 03/13/2021 0939      Component Value Date/Time   CALCIUM 8.4 (L) 03/13/2021 0939   ALKPHOS 82 03/13/2021 0939   AST 20 03/13/2021 0939   ALT 18 03/13/2021 0939   BILITOT 0.3 03/13/2021 0939       RADIOGRAPHIC STUDIES: CT Chest W Contrast  Result Date: 03/12/2021 CLINICAL DATA:  Primary Cancer Type: Lung Imaging Indication: Assess response to therapy Interval therapy since last imaging? Yes Initial Cancer Diagnosis Date: 04/09/2020; Established by: Biopsy-proven Detailed Pathology: Extensive stage small cell lung cancer. Primary Tumor location: Right upper lobe.  Metastatic liver lesions. Surgeries: No. Chemotherapy: Yes; Ongoing? Yes; Most recent administration: 02/20/2021 Immunotherapy?  Yes; Type: Imfinzi; Ongoing? No Radiation therapy? Yes; Date Range: 04/12/2020 - 05/02/2020; Target: Right lung EXAM: CT CHEST, ABDOMEN, AND PELVIS WITH CONTRAST TECHNIQUE: Multidetector CT imaging of the chest, abdomen and pelvis was performed following the standard protocol during bolus administration of intravenous contrast. CONTRAST:  162mL OMNIPAQUE IOHEXOL 300 MG/ML  SOLN COMPARISON:  Most recent CT chest, abdomen and pelvis 01/28/2021. 03/28/2020 PET-CT. FINDINGS: CT CHEST FINDINGS Cardiovascular: Normal heart size. Aortic atherosclerosis. Coronary artery calcifications. Mediastinum/Nodes: No discrete thyroid nodule. The trachea appears patent and is midline. Normal appearance of the esophagus. Similar fluid and soft tissue stranding within the low right paratracheal region. No mediastinal or hilar adenopathy identified. Lungs/Pleura: Moderate right pleural effusion is similar to the previous exam. Paramediastinal radiation change within the right lung appears stable. No suspicious nodule or mass identified to suggest local tumor recurrence within the  right lung. The left lung appears clear. Musculoskeletal: Spondylosis identified within the thoracic spine. No acute or suspicious osseous findings. CT ABDOMEN PELVIS FINDINGS Hepatobiliary: Scattered small low-attenuation liver lesions compatible with metastatic disease are again noted. -index lesion within segment 3 measures 1.0 cm, image 58/2. Previously 1.5 cm -index lesion within the caudate lobe measures 1.3 cm, image 58/2. Previously 1.5 cm. -Index lesion in segment 5 measures 1 cm, image 58/2. Previously 1.4 cm. -Index lesion within segment 4a measures 1 cm, image 52/2. Previously 1.3 cm. The gallbladder is unremarkable.  No bile duct dilatation. Pancreas: Unremarkable. No pancreatic ductal dilatation or surrounding inflammatory changes. Spleen: Normal in size without focal abnormality. Adrenals/Urinary Tract: Stable 1.6 cm right adrenal nodule, image 58/2. Normal left  adrenal gland. Multiple right renal kidney cysts are identified. The largest measures up to 7 cm, image 74/2. Grossly unchanged. No signs of nephrolithiasis, solid enhancing mass, or hydronephrosis. Bladder unremarkable. Stomach/Bowel: Stomach is within normal limits. Appendix appears normal. No evidence of bowel wall thickening, distention, or inflammatory changes. Vascular/Lymphatic: Aortic atherosclerosis. No aneurysm. No abdominopelvic adenopathy. Reproductive: The prostate gland and seminal vesicles appear normal. Other: No free fluid or fluid collections. Musculoskeletal: Degenerative disc disease identified within the visualized thoracolumbar spine. No acute or suspicious osseous findings. IMPRESSION: 1. Continued gradual decrease in size of small bilobar liver metastasis. 2. Stable post treatment changes within the right lung without specific findings to suggest local recurrence of tumor. 3. Stable right adrenal gland nodule which is favored to represent a benign adenoma. 4.  Aortic Atherosclerosis (ICD10-I70.0). Electronically Signed    By: Signa Kell M.D.   On: 03/12/2021 10:54   CT Abdomen Pelvis W Contrast  Result Date: 03/12/2021 CLINICAL DATA:  Primary Cancer Type: Lung Imaging Indication: Assess response to therapy Interval therapy since last imaging? Yes Initial Cancer Diagnosis Date: 04/09/2020; Established by: Biopsy-proven Detailed Pathology: Extensive stage small cell lung cancer. Primary Tumor location: Right upper lobe.  Metastatic liver lesions. Surgeries: No. Chemotherapy: Yes; Ongoing? Yes; Most recent administration: 02/20/2021 Immunotherapy?  Yes; Type: Imfinzi; Ongoing? No Radiation therapy? Yes; Date Range: 04/12/2020 - 05/02/2020; Target: Right lung EXAM: CT CHEST, ABDOMEN, AND PELVIS WITH CONTRAST TECHNIQUE: Multidetector CT imaging of the chest, abdomen and pelvis was performed following the standard protocol during bolus administration of intravenous contrast. CONTRAST:  OMNIPAQUE IOHEXOL 300 MG/ML  SOLN COMPARISON:  Most recent CT chest, abdomen and pelvis 01/28/2021. 03/28/2020 PET-CT. FINDINGS: CT CHEST FINDINGS Cardiovascular: Normal heart size. Aortic atherosclerosis. Coronary artery calcifications. Mediastinum/Nodes: No discrete thyroid nodule. The trachea appears patent and is midline. Normal appearance of the esophagus. Similar fluid and soft tissue stranding within the low right paratracheal region. No mediastinal or hilar adenopathy identified. Lungs/Pleura: Moderate right pleural effusion is similar to the previous exam. Paramediastinal radiation change within the right lung appears stable. No suspicious nodule or mass identified to suggest local tumor recurrence within the right lung. The left lung appears clear. Musculoskeletal: Spondylosis identified within the thoracic spine. No acute or suspicious osseous findings. CT ABDOMEN PELVIS FINDINGS Hepatobiliary: Scattered small low-attenuation liver lesions compatible with metastatic disease are again noted. -index lesion within segment 3 measures  1.0 cm, image 58/2. Previously 1.5 cm -index lesion within the caudate lobe measures 1.3 cm, image 58/2. Previously 1.5 cm. -Index lesion in segment 5 measures 1 cm, image 58/2. Previously 1.4 cm. -Index lesion within segment 4a measures 1 cm, image 52/2. Previously 1.3 cm. The gallbladder is unremarkable.  No bile duct dilatation. Pancreas: Unremarkable. No pancreatic ductal dilatation or surrounding inflammatory changes. Spleen: Normal in size without focal abnormality. Adrenals/Urinary Tract: Stable 1.6 cm right adrenal nodule, image 58/2. Normal left adrenal gland. Multiple right renal kidney cysts are identified. The largest measures up to 7 cm, image 74/2. Grossly unchanged. No signs of nephrolithiasis, solid enhancing mass, or hydronephrosis. Bladder unremarkable. Stomach/Bowel: Stomach is within normal limits. Appendix appears normal. No evidence of bowel wall thickening, distention, or inflammatory changes. Vascular/Lymphatic: Aortic atherosclerosis. No aneurysm. No abdominopelvic adenopathy. Reproductive: The prostate gland and seminal vesicles appear normal. Other: No free fluid or fluid collections. Musculoskeletal: Degenerative disc disease identified within the visualized thoracolumbar spine. No acute or suspicious osseous findings. IMPRESSION: 1. Continued gradual decrease in size of small bilobar liver  metastasis. 2. Stable post treatment changes within the right lung without specific findings to suggest local recurrence of tumor. 3. Stable right adrenal gland nodule which is favored to represent a benign adenoma. 4.  Aortic Atherosclerosis (ICD10-I70.0). Electronically Signed   By: Kerby Moors M.D.   On: 03/12/2021 10:54    ASSESSMENT AND PLAN: This is a very pleasant 70 years old white male recently diagnosed with extensive stage (T4, N3, M1 C) small cell lung cancer presented with large right upper lobe lung mass in addition to mediastinal and right supraclavicular lymphadenopathy and  multiple metastatic liver lesions diagnosed in April 2021. The patient underwent systemic chemotherapy with carboplatin for AUC of 5 on day 1, etoposide 100 mg/M2 on days 1, 2 and 3 in addition to Cosela for myeloprotection during the days of the chemotherapy.  He will also receive immunotherapy with Imfinzi on day one of the chemotherapy cycle.  Status post 9 cycles.  Starting from cycle #5 he is on maintenance treatment with Imfinzi 1500 mg IV every 4 weeks. The patient has been tolerating this treatment well with no concerning adverse effect except for the worsening cough and shortness of breath recently.   He had repeat CT scan of the chest, abdomen pelvis performed recently.  I personally and independently reviewed the scan images and discussed the results with the patient and his wife today. Unfortunately his scan showed evidence for disease progression with increase in the right pleural effusion as well as enlargement of multiple liver lesions. I recommended for the patient to discontinue his current maintenance treatment with Imfinzi at this point. He resumed systemic chemotherapy with carboplatin for AUC of 5 on day 1, etoposide 100 mg/M2 on days 1, 2 and 3 with Tecentriq 1200 mg every 3 weeks as well as Cosela on the days of the chemotherapy.  He is status post 4 cycles.   The patient continues to tolerate this treatment well with no concerning adverse effects. He had repeat CT scan of the chest, abdomen pelvis performed recently.  I personally and independently reviewed the scan images and discussed the results with the patient and his wife today. His scan showed further improvement in his disease.  He continues to have small to moderate right pleural effusion and we will continue to monitor it for now and consider repeat ultrasound-guided thoracentesis if he becomes more dyspneic. I recommended for him to proceed with the first cycle of maintenance treatment with Tecentriq today as  planned. The patient will come back for follow-up visit in 3 weeks for evaluation before the next cycle of his treatment. For the cough, he will continue on Tussionex on as-needed basis. The patient was advised to call immediately if he has any concerning symptoms in the interval.  The patient voices understanding of current disease status and treatment options and is in agreement with the current care plan.  All questions were answered. The patient knows to call the clinic with any problems, questions or concerns. We can certainly see the patient much sooner if necessary.  Disclaimer: This note was dictated with voice recognition software. Similar sounding words can inadvertently be transcribed and may not be corrected upon review.

## 2021-03-13 NOTE — Patient Instructions (Signed)
Cancer Center Discharge Instructions for Patients Receiving Chemotherapy  Today you received the following chemotherapy agents: Atezolizumab (Tecentriq)  To help prevent nausea and vomiting after your treatment, we encourage you to take your nausea medication as prescribed.    If you develop nausea and vomiting that is not controlled by your nausea medication, call the clinic.   BELOW ARE SYMPTOMS THAT SHOULD BE REPORTED IMMEDIATELY:  *FEVER GREATER THAN 100.5 F  *CHILLS WITH OR WITHOUT FEVER  NAUSEA AND VOMITING THAT IS NOT CONTROLLED WITH YOUR NAUSEA MEDICATION  *UNUSUAL SHORTNESS OF BREATH  *UNUSUAL BRUISING OR BLEEDING  TENDERNESS IN MOUTH AND THROAT WITH OR WITHOUT PRESENCE OF ULCERS  *URINARY PROBLEMS  *BOWEL PROBLEMS  UNUSUAL RASH Items with * indicate a potential emergency and should be followed up as soon as possible.  Feel free to call the clinic should you have any questions or concerns. The clinic phone number is [phone].  Please show the CHEMO ALERT CARD at check-in to the Emergency Department and triage nurse.   

## 2021-03-17 ENCOUNTER — Other Ambulatory Visit: Payer: Self-pay | Admitting: Primary Care

## 2021-03-17 DIAGNOSIS — E119 Type 2 diabetes mellitus without complications: Secondary | ICD-10-CM

## 2021-03-19 ENCOUNTER — Other Ambulatory Visit: Payer: Self-pay | Admitting: Primary Care

## 2021-03-20 ENCOUNTER — Other Ambulatory Visit: Payer: Self-pay

## 2021-03-20 ENCOUNTER — Inpatient Hospital Stay: Payer: Medicare Other | Attending: Internal Medicine

## 2021-03-20 DIAGNOSIS — C787 Secondary malignant neoplasm of liver and intrahepatic bile duct: Secondary | ICD-10-CM | POA: Insufficient documentation

## 2021-03-20 DIAGNOSIS — J90 Pleural effusion, not elsewhere classified: Secondary | ICD-10-CM | POA: Diagnosis not present

## 2021-03-20 DIAGNOSIS — Z79899 Other long term (current) drug therapy: Secondary | ICD-10-CM | POA: Diagnosis not present

## 2021-03-20 DIAGNOSIS — Z5112 Encounter for antineoplastic immunotherapy: Secondary | ICD-10-CM | POA: Diagnosis not present

## 2021-03-20 DIAGNOSIS — C3411 Malignant neoplasm of upper lobe, right bronchus or lung: Secondary | ICD-10-CM | POA: Diagnosis not present

## 2021-03-20 DIAGNOSIS — R059 Cough, unspecified: Secondary | ICD-10-CM | POA: Diagnosis not present

## 2021-03-20 LAB — CBC WITH DIFFERENTIAL (CANCER CENTER ONLY)
Abs Immature Granulocytes: 0.02 10*3/uL (ref 0.00–0.07)
Basophils Absolute: 0 10*3/uL (ref 0.0–0.1)
Basophils Relative: 1 %
Eosinophils Absolute: 0.1 10*3/uL (ref 0.0–0.5)
Eosinophils Relative: 2 %
HCT: 40.7 % (ref 39.0–52.0)
Hemoglobin: 13.2 g/dL (ref 13.0–17.0)
Immature Granulocytes: 0 %
Lymphocytes Relative: 15 %
Lymphs Abs: 0.9 10*3/uL (ref 0.7–4.0)
MCH: 30.6 pg (ref 26.0–34.0)
MCHC: 32.4 g/dL (ref 30.0–36.0)
MCV: 94.4 fL (ref 80.0–100.0)
Monocytes Absolute: 0.6 10*3/uL (ref 0.1–1.0)
Monocytes Relative: 11 %
Neutro Abs: 4.3 10*3/uL (ref 1.7–7.7)
Neutrophils Relative %: 71 %
Platelet Count: 222 10*3/uL (ref 150–400)
RBC: 4.31 MIL/uL (ref 4.22–5.81)
RDW: 17.6 % — ABNORMAL HIGH (ref 11.5–15.5)
WBC Count: 6.1 10*3/uL (ref 4.0–10.5)
nRBC: 0 % (ref 0.0–0.2)

## 2021-03-20 LAB — CMP (CANCER CENTER ONLY)
ALT: 21 U/L (ref 0–44)
AST: 19 U/L (ref 15–41)
Albumin: 4.1 g/dL (ref 3.5–5.0)
Alkaline Phosphatase: 77 U/L (ref 38–126)
Anion gap: 12 (ref 5–15)
BUN: 14 mg/dL (ref 8–23)
CO2: 28 mmol/L (ref 22–32)
Calcium: 9 mg/dL (ref 8.9–10.3)
Chloride: 102 mmol/L (ref 98–111)
Creatinine: 1.27 mg/dL — ABNORMAL HIGH (ref 0.61–1.24)
GFR, Estimated: >60 mL/min (ref >60)
Glucose, Bld: 134 mg/dL — ABNORMAL HIGH (ref 70–99)
Potassium: 4.9 mmol/L (ref 3.5–5.1)
Sodium: 142 mmol/L (ref 135–145)
Total Bilirubin: 0.4 mg/dL (ref 0.3–1.2)
Total Protein: 6.6 g/dL (ref 6.5–8.1)

## 2021-03-27 ENCOUNTER — Other Ambulatory Visit: Payer: Self-pay

## 2021-03-27 ENCOUNTER — Inpatient Hospital Stay: Payer: Medicare Other

## 2021-03-27 DIAGNOSIS — Z79899 Other long term (current) drug therapy: Secondary | ICD-10-CM | POA: Diagnosis not present

## 2021-03-27 DIAGNOSIS — C787 Secondary malignant neoplasm of liver and intrahepatic bile duct: Secondary | ICD-10-CM | POA: Diagnosis not present

## 2021-03-27 DIAGNOSIS — R5382 Chronic fatigue, unspecified: Secondary | ICD-10-CM

## 2021-03-27 DIAGNOSIS — J90 Pleural effusion, not elsewhere classified: Secondary | ICD-10-CM | POA: Diagnosis not present

## 2021-03-27 DIAGNOSIS — C3411 Malignant neoplasm of upper lobe, right bronchus or lung: Secondary | ICD-10-CM

## 2021-03-27 DIAGNOSIS — Z5112 Encounter for antineoplastic immunotherapy: Secondary | ICD-10-CM | POA: Diagnosis not present

## 2021-03-27 DIAGNOSIS — R059 Cough, unspecified: Secondary | ICD-10-CM | POA: Diagnosis not present

## 2021-03-27 LAB — TSH: TSH: 0.704 u[IU]/mL (ref 0.320–4.118)

## 2021-04-01 ENCOUNTER — Other Ambulatory Visit: Payer: Self-pay | Admitting: Physician Assistant

## 2021-04-01 DIAGNOSIS — R5383 Other fatigue: Secondary | ICD-10-CM

## 2021-04-01 DIAGNOSIS — C3411 Malignant neoplasm of upper lobe, right bronchus or lung: Secondary | ICD-10-CM

## 2021-04-02 ENCOUNTER — Inpatient Hospital Stay: Payer: Medicare Other

## 2021-04-02 ENCOUNTER — Other Ambulatory Visit: Payer: Self-pay

## 2021-04-02 ENCOUNTER — Encounter: Payer: Self-pay | Admitting: Internal Medicine

## 2021-04-02 ENCOUNTER — Inpatient Hospital Stay (HOSPITAL_BASED_OUTPATIENT_CLINIC_OR_DEPARTMENT_OTHER): Payer: Medicare Other | Admitting: Internal Medicine

## 2021-04-02 VITALS — BP 130/93 | HR 99 | Temp 97.6°F | Resp 19 | Ht 69.0 in | Wt 294.5 lb

## 2021-04-02 DIAGNOSIS — C3411 Malignant neoplasm of upper lobe, right bronchus or lung: Secondary | ICD-10-CM | POA: Diagnosis not present

## 2021-04-02 DIAGNOSIS — J90 Pleural effusion, not elsewhere classified: Secondary | ICD-10-CM | POA: Diagnosis not present

## 2021-04-02 DIAGNOSIS — R5383 Other fatigue: Secondary | ICD-10-CM

## 2021-04-02 DIAGNOSIS — Z5112 Encounter for antineoplastic immunotherapy: Secondary | ICD-10-CM

## 2021-04-02 DIAGNOSIS — R059 Cough, unspecified: Secondary | ICD-10-CM | POA: Diagnosis not present

## 2021-04-02 DIAGNOSIS — Z79899 Other long term (current) drug therapy: Secondary | ICD-10-CM | POA: Diagnosis not present

## 2021-04-02 DIAGNOSIS — C787 Secondary malignant neoplasm of liver and intrahepatic bile duct: Secondary | ICD-10-CM | POA: Diagnosis not present

## 2021-04-02 LAB — CMP (CANCER CENTER ONLY)
ALT: 15 U/L (ref 0–44)
AST: 19 U/L (ref 15–41)
Albumin: 3.9 g/dL (ref 3.5–5.0)
Alkaline Phosphatase: 71 U/L (ref 38–126)
Anion gap: 10 (ref 5–15)
BUN: 13 mg/dL (ref 8–23)
CO2: 26 mmol/L (ref 22–32)
Calcium: 8.9 mg/dL (ref 8.9–10.3)
Chloride: 105 mmol/L (ref 98–111)
Creatinine: 1.27 mg/dL — ABNORMAL HIGH (ref 0.61–1.24)
GFR, Estimated: >60 mL/min (ref >60)
Glucose, Bld: 158 mg/dL — ABNORMAL HIGH (ref 70–99)
Potassium: 4.4 mmol/L (ref 3.5–5.1)
Sodium: 141 mmol/L (ref 135–145)
Total Bilirubin: 0.4 mg/dL (ref 0.3–1.2)
Total Protein: 6.3 g/dL — ABNORMAL LOW (ref 6.5–8.1)

## 2021-04-02 LAB — CBC WITH DIFFERENTIAL (CANCER CENTER ONLY)
Abs Immature Granulocytes: 0.01 10*3/uL (ref 0.00–0.07)
Basophils Absolute: 0.1 10*3/uL (ref 0.0–0.1)
Basophils Relative: 1 %
Eosinophils Absolute: 0.2 10*3/uL (ref 0.0–0.5)
Eosinophils Relative: 3 %
HCT: 39.8 % (ref 39.0–52.0)
Hemoglobin: 12.9 g/dL — ABNORMAL LOW (ref 13.0–17.0)
Immature Granulocytes: 0 %
Lymphocytes Relative: 14 %
Lymphs Abs: 0.8 10*3/uL (ref 0.7–4.0)
MCH: 30.7 pg (ref 26.0–34.0)
MCHC: 32.4 g/dL (ref 30.0–36.0)
MCV: 94.8 fL (ref 80.0–100.0)
Monocytes Absolute: 0.6 10*3/uL (ref 0.1–1.0)
Monocytes Relative: 11 %
Neutro Abs: 3.8 10*3/uL (ref 1.7–7.7)
Neutrophils Relative %: 71 %
Platelet Count: 150 10*3/uL (ref 150–400)
RBC: 4.2 MIL/uL — ABNORMAL LOW (ref 4.22–5.81)
RDW: 15.3 % (ref 11.5–15.5)
WBC Count: 5.4 10*3/uL (ref 4.0–10.5)
nRBC: 0 % (ref 0.0–0.2)

## 2021-04-02 LAB — TSH: TSH: 0.851 u[IU]/mL (ref 0.320–4.118)

## 2021-04-02 MED ORDER — SODIUM CHLORIDE 0.9 % IV SOLN
Freq: Once | INTRAVENOUS | Status: AC
Start: 2021-04-02 — End: 2021-04-02
  Filled 2021-04-02: qty 250

## 2021-04-02 MED ORDER — SODIUM CHLORIDE 0.9 % IV SOLN
1200.0000 mg | Freq: Once | INTRAVENOUS | Status: AC
  Administered 2021-04-02: 1200 mg via INTRAVENOUS
  Filled 2021-04-02: qty 20

## 2021-04-02 MED ORDER — SODIUM CHLORIDE 0.9% FLUSH
10.0000 mL | INTRAVENOUS | Status: DC | PRN
  Administered 2021-04-02: 10 mL
  Filled 2021-04-02: qty 10

## 2021-04-02 MED ORDER — HEPARIN SOD (PORK) LOCK FLUSH 100 UNIT/ML IV SOLN
500.0000 [IU] | Freq: Once | INTRAVENOUS | Status: AC | PRN
Start: 2021-04-02 — End: 2021-04-02
  Administered 2021-04-02: 500 [IU]
  Filled 2021-04-02: qty 5

## 2021-04-02 NOTE — Progress Notes (Signed)
[facility] [phone]   [phone]  OFFICE PROGRESS NOTE  [name], NP [place] [address]  DIAGNOSIS: Relapsed extensive stage (T4, N3, M1c)  small cell lung cancer diagnosed in April 2021 and presented with large right upper lobe/right hilar mass with ipsilateral and contralateral mediastinal and right supraclavicular lymphadenopathy in addition to multiple liver lesios. He has disease progression after the first line of chemotherapy in December 2021.  PRIOR THERAPY:  1) Palliative radiotherapy to the right upper lobe lung mass under the care of Dr. Lisbeth Renshaw. 2) Systemic chemotherapy with carboplatin for AUC of 5 on day 1, etoposide 100 mg/M2 on days 1, 2 and 3 in addition to Imfinzi 1500 mg IV every 3 weeks with chemotherapy then every 4 weeks for maintenance if the patient has no evidence for progression.  He will also receive Cosela 240 mg/m2 on the days of the chemotherapy.  Status post 9 cycles.  Starting from cycle #5 the patient will be on maintenance treatment with immunotherapy with Imfinzi 1500 mg IV every 4 weeks. Last dose of chemotherapy was given on November 13, 2020. This treatment was discontinued secondary to disease progression.  CURRENT THERAPY: Systemic chemotherapy with carboplatin for AUC of 5 on day 1, etoposide 100 mg/M2 on days 1, 2 and 3, Tecentriq 1200 mg IV every 3 weeks as well as Cosela 250 mg/M2 on the days of the chemotherapy every 3 weeks.  First dose December 18, 2020.  Status post 5 cycles.  INTERVAL HISTORY: Adam Wise 70 y.o. male returns to the clinic today for follow-up visit.  The patient is feeling fine today with no concerning complaints.  He did not come with his wife because she has any cardiac issues and she is currently at the emergency department at [facility] center.  He denied having any chest pain but has shortness of breath with exertion with mild cough and no hemoptysis.   He denied having any fever or chills.  He has no nausea, vomiting, diarrhea or constipation.  He has no headache or visual changes.  He is here today for evaluation before starting cycle #6 of his treatment.  MEDICAL HISTORY: Past Medical History:  Diagnosis Date  . Cancer ([facility])   . Chickenpox   . Chronic knee pain   . Chronic low back pain   . Essential hypertension   . GERD (gastroesophageal reflux disease)   . OSA (obstructive sleep apnea)   . Testosterone deficiency   . Type 2 diabetes mellitus ([facility])     ALLERGIES:  is allergic to bupropion.  MEDICATIONS:  Current Outpatient Medications  Medication Sig Dispense Refill  . albuterol (VENTOLIN HFA) 108 (90 Base) MCG/ACT inhaler Inhale 2 puffs into the lungs every 6 (six) hours as needed for wheezing or shortness of breath. 18 g 0  . amLODipine (NORVASC) 10 MG tablet TAKE 1 TABLET BY MOUTH EVERY DAY 90 tablet 1  . B-D 3CC LUER-LOK SYR 22GX1" 22G X 1" 3 ML MISC USE AS INSTRUCTED FOR TESTOSTERONE INJECTION EVERY 2 WEEKS 10 each 2  . Black Pepper-Turmeric (TURMERIC COMPLEX/BLACK PEPPER PO) Take 1 tablet by mouth in the morning and at bedtime.    . blood glucose meter kit and supplies KIT Dispense based on patient and insurance preference. Use up to four times daily as directed. 1 each 0  . chlorpheniramine-HYDROcodone (TUSSIONEX) 10-8 MG/5ML SUER Take 5 mLs by mouth at bedtime as needed  for cough. 140 mL 0  . Coenzyme Q10 ([employer] COQ-10) 200 MG capsule Take 200 mg by mouth every evening.     Marland Kitchen ELIQUIS 5 MG TABS tablet TAKE 1 TABLET BY MOUTH TWICE A DAY 180 tablet 2  . esomeprazole (NEXIUM) 20 MG capsule Take 20 mg by mouth daily.     . fluticasone (FLONASE) 50 MCG/ACT nasal spray Place 2 sprays into both nostrils daily as needed for allergies.    Marland Kitchen gabapentin (NEURONTIN) 300 MG capsule Take 300 mg by mouth 2 (two) times daily.     Marland Kitchen glipiZIDE (GLUCOTROL) 10 MG tablet TAKE 1 TABLET (10 MG TOTAL) BY MOUTH 2 (TWO) TIMES DAILY BEFORE A MEAL.  FOR DIABETES. 180 tablet 2  . glucose blood (ONETOUCH ULTRA) test strip USE UP TO 4 TIMES DAILY AS DIRECTED 100 strip 5  . Insulin Glargine (BASAGLAR KWIKPEN) 100 UNIT/ML Inject 20 Units into the skin at bedtime. For diabetes. 15 mL 3  . Insulin Pen Needle (BD PEN NEEDLE NANO U/F) 32G X 4 MM MISC Use with insulin as prescribed  Dx Code: E11.9 100 each 2  . KRILL OIL PO Take 350 mg by mouth daily.    . Lancets MISC USE UP TO 3 TIMES DAILY AS DIRECTED 100 each 2  . lidocaine-prilocaine (EMLA) cream Apply 1 application topically as needed. 30 g 2  . lisinopril (ZESTRIL) 5 MG tablet TAKE 1 TABLET BY MOUTH EVERY DAY 90 tablet 1  . Melatonin 10 MG CAPS Take 10 mg by mouth at bedtime.    . metFORMIN (GLUCOPHAGE) 1000 MG tablet TAKE 1 TABLET (1,000 MG TOTAL) BY MOUTH 2 (TWO) TIMES DAILY WITH A MEAL. 180 tablet 0  . Multiple Vitamin (MULTI-VITAMINS) TABS Take 1 tablet by mouth daily.     Marland Kitchen OVER THE COUNTER MEDICATION Take by mouth.    Marland Kitchen oxymetazoline (AFRIN) 0.05 % nasal spray Place 1 spray into both nostrils 2 (two) times daily as needed for congestion.    . pioglitazone (ACTOS) 45 MG tablet Take 1 tablet (45 mg total) by mouth daily. For diabetes. 90 tablet 2  . pravastatin (PRAVACHOL) 40 MG tablet TAKE 1 TABLET BY MOUTH EVERY DAY IN THE EVENING FOR CHOLESTEROL 90 tablet 1  . prochlorperazine (COMPAZINE) 10 MG tablet Take 1 tablet (10 mg total) by mouth every 6 (six) hours as needed for nausea or vomiting. 30 tablet 0  . sildenafil (REVATIO) 20 MG tablet Take 3-5 tablets by mouth 1 hour prior to intercourse as needed. 50 tablet 0  . testosterone cypionate (DEPOTESTOSTERONE CYPIONATE) 200 MG/ML injection Inject 200 mg into the muscle every 14 (fourteen) days.     No current facility-administered medications for this visit.    SURGICAL HISTORY:  Past Surgical History:  Procedure Laterality Date  . COLONOSCOPY WITH PROPOFOL N/A 12/17/2018   Procedure: COLONOSCOPY WITH PROPOFOL;  Surgeon: Virgel Manifold, MD;  Location: [facility] ENDOSCOPY;  Service: Gastroenterology;  Laterality: N/A;  . IR IMAGING GUIDED PORT INSERTION  04/17/2020  . JOINT REPLACEMENT Bilateral   . REPLACEMENT TOTAL KNEE BILATERAL  2015  . TONSILLECTOMY  1960    REVIEW OF SYSTEMS:  A comprehensive review of systems was negative except for: Constitutional: positive for fatigue Respiratory: positive for dyspnea on exertion   PHYSICAL EXAMINATION: General appearance: alert, cooperative, fatigued and no distress Head: atraumatic Neck: no adenopathy, no JVD, supple, symmetrical, trachea midline and thyroid not enlarged, symmetric, no tenderness/mass/nodules Lymph nodes: Cervical, supraclavicular, and axillary nodes normal.  Resp: diminished breath sounds RLL and dullness to percussion RLL Back: symmetric, no curvature. ROM normal. No CVA tenderness. Cardio: regular rate and rhythm, S1, S2 normal, no murmur, click, rub or gallop GI: soft, non-tender; bowel sounds normal; no masses,  no organomegaly Extremities: extremities normal, atraumatic, no cyanosis or edema  ECOG PERFORMANCE STATUS: 1 - Symptomatic but completely ambulatory  Blood pressure (!) 130/93, pulse 99, temperature 97.6 F (36.4 C), temperature source Tympanic, resp. rate 19, height $RemoveBe'5\' 9"'kpiFadWkr$  (1.753 m), weight 294 lb 8 oz (133.6 kg), SpO2 99 %.  LABORATORY DATA: Lab Results  Component Value Date   WBC 5.4 04/02/2021   HGB 12.9 (L) 04/02/2021   HCT 39.8 04/02/2021   MCV 94.8 04/02/2021   PLT 150 04/02/2021      Chemistry      Component Value Date/Time   NA 141 04/02/2021 0928   K 4.4 04/02/2021 0928   CL 105 04/02/2021 0928   CO2 26 04/02/2021 0928   BUN 13 04/02/2021 0928   CREATININE 1.27 (H) 04/02/2021 0928      Component Value Date/Time   CALCIUM 8.9 04/02/2021 0928   ALKPHOS 71 04/02/2021 0928   AST 19 04/02/2021 0928   ALT 15 04/02/2021 0928   BILITOT 0.4 04/02/2021 0928       RADIOGRAPHIC STUDIES: CT Chest W Contrast  Result  Date: 03/12/2021 CLINICAL DATA:  Primary Cancer Type: Lung Imaging Indication: Assess response to therapy Interval therapy since last imaging? Yes Initial Cancer Diagnosis Date: 04/09/2020; Established by: Biopsy-proven Detailed Pathology: Extensive stage small cell lung cancer. Primary Tumor location: Right upper lobe.  Metastatic liver lesions. Surgeries: No. Chemotherapy: Yes; Ongoing? Yes; Most recent administration: 02/20/2021 Immunotherapy?  Yes; Type: Imfinzi; Ongoing? No Radiation therapy? Yes; Date Range: 04/12/2020 - 05/02/2020; Target: Right lung EXAM: CT CHEST, ABDOMEN, AND PELVIS WITH CONTRAST TECHNIQUE: Multidetector CT imaging of the chest, abdomen and pelvis was performed following the standard protocol during bolus administration of intravenous contrast. CONTRAST:  144mL OMNIPAQUE IOHEXOL 300 MG/ML  SOLN COMPARISON:  Most recent CT chest, abdomen and pelvis 01/28/2021. 03/28/2020 PET-CT. FINDINGS: CT CHEST FINDINGS Cardiovascular: Normal heart size. Aortic atherosclerosis. Coronary artery calcifications. Mediastinum/Nodes: No discrete thyroid nodule. The trachea appears patent and is midline. Normal appearance of the esophagus. Similar fluid and soft tissue stranding within the low right paratracheal region. No mediastinal or hilar adenopathy identified. Lungs/Pleura: Moderate right pleural effusion is similar to the previous exam. Paramediastinal radiation change within the right lung appears stable. No suspicious nodule or mass identified to suggest local tumor recurrence within the right lung. The left lung appears clear. Musculoskeletal: Spondylosis identified within the thoracic spine. No acute or suspicious osseous findings. CT ABDOMEN PELVIS FINDINGS Hepatobiliary: Scattered small low-attenuation liver lesions compatible with metastatic disease are again noted. -index lesion within segment 3 measures 1.0 cm, image 58/2. Previously 1.5 cm -index lesion within the caudate lobe measures 1.3 cm,  image 58/2. Previously 1.5 cm. -Index lesion in segment 5 measures 1 cm, image 58/2. Previously 1.4 cm. -Index lesion within segment 4a measures 1 cm, image 52/2. Previously 1.3 cm. The gallbladder is unremarkable.  No bile duct dilatation. Pancreas: Unremarkable. No pancreatic ductal dilatation or surrounding inflammatory changes. Spleen: Normal in size without focal abnormality. Adrenals/Urinary Tract: Stable 1.6 cm right adrenal nodule, image 58/2. Normal left adrenal gland. Multiple right renal kidney cysts are identified. The largest measures up to 7 cm, image 74/2. Grossly unchanged. No signs of nephrolithiasis, solid enhancing mass, or hydronephrosis. Bladder  unremarkable. Stomach/Bowel: Stomach is within normal limits. Appendix appears normal. No evidence of bowel wall thickening, distention, or inflammatory changes. Vascular/Lymphatic: Aortic atherosclerosis. No aneurysm. No abdominopelvic adenopathy. Reproductive: The prostate gland and seminal vesicles appear normal. Other: No free fluid or fluid collections. Musculoskeletal: Degenerative disc disease identified within the visualized thoracolumbar spine. No acute or suspicious osseous findings. IMPRESSION: 1. Continued gradual decrease in size of small bilobar liver metastasis. 2. Stable post treatment changes within the right lung without specific findings to suggest local recurrence of tumor. 3. Stable right adrenal gland nodule which is favored to represent a benign adenoma. 4.  Aortic Atherosclerosis (ICD10-I70.0). Electronically Signed   By: Kerby Moors M.D.   On: 03/12/2021 10:54   CT Abdomen Pelvis W Contrast  Result Date: 03/12/2021 CLINICAL DATA:  Primary Cancer Type: Lung Imaging Indication: Assess response to therapy Interval therapy since last imaging? Yes Initial Cancer Diagnosis Date: 04/09/2020; Established by: Biopsy-proven Detailed Pathology: Extensive stage small cell lung cancer. Primary Tumor location: Right upper lobe.   Metastatic liver lesions. Surgeries: No. Chemotherapy: Yes; Ongoing? Yes; Most recent administration: 02/20/2021 Immunotherapy?  Yes; Type: Imfinzi; Ongoing? No Radiation therapy? Yes; Date Range: 04/12/2020 - 05/02/2020; Target: Right lung EXAM: CT CHEST, ABDOMEN, AND PELVIS WITH CONTRAST TECHNIQUE: Multidetector CT imaging of the chest, abdomen and pelvis was performed following the standard protocol during bolus administration of intravenous contrast. CONTRAST:  161mL OMNIPAQUE IOHEXOL 300 MG/ML  SOLN COMPARISON:  Most recent CT chest, abdomen and pelvis 01/28/2021. 03/28/2020 PET-CT. FINDINGS: CT CHEST FINDINGS Cardiovascular: Normal heart size. Aortic atherosclerosis. Coronary artery calcifications. Mediastinum/Nodes: No discrete thyroid nodule. The trachea appears patent and is midline. Normal appearance of the esophagus. Similar fluid and soft tissue stranding within the low right paratracheal region. No mediastinal or hilar adenopathy identified. Lungs/Pleura: Moderate right pleural effusion is similar to the previous exam. Paramediastinal radiation change within the right lung appears stable. No suspicious nodule or mass identified to suggest local tumor recurrence within the right lung. The left lung appears clear. Musculoskeletal: Spondylosis identified within the thoracic spine. No acute or suspicious osseous findings. CT ABDOMEN PELVIS FINDINGS Hepatobiliary: Scattered small low-attenuation liver lesions compatible with metastatic disease are again noted. -index lesion within segment 3 measures 1.0 cm, image 58/2. Previously 1.5 cm -index lesion within the caudate lobe measures 1.3 cm, image 58/2. Previously 1.5 cm. -Index lesion in segment 5 measures 1 cm, image 58/2. Previously 1.4 cm. -Index lesion within segment 4a measures 1 cm, image 52/2. Previously 1.3 cm. The gallbladder is unremarkable.  No bile duct dilatation. Pancreas: Unremarkable. No pancreatic ductal dilatation or surrounding  inflammatory changes. Spleen: Normal in size without focal abnormality. Adrenals/Urinary Tract: Stable 1.6 cm right adrenal nodule, image 58/2. Normal left adrenal gland. Multiple right renal kidney cysts are identified. The largest measures up to 7 cm, image 74/2. Grossly unchanged. No signs of nephrolithiasis, solid enhancing mass, or hydronephrosis. Bladder unremarkable. Stomach/Bowel: Stomach is within normal limits. Appendix appears normal. No evidence of bowel wall thickening, distention, or inflammatory changes. Vascular/Lymphatic: Aortic atherosclerosis. No aneurysm. No abdominopelvic adenopathy. Reproductive: The prostate gland and seminal vesicles appear normal. Other: No free fluid or fluid collections. Musculoskeletal: Degenerative disc disease identified within the visualized thoracolumbar spine. No acute or suspicious osseous findings. IMPRESSION: 1. Continued gradual decrease in size of small bilobar liver metastasis. 2. Stable post treatment changes within the right lung without specific findings to suggest local recurrence of tumor. 3. Stable right adrenal gland nodule which is favored to represent  a benign adenoma. 4.  Aortic Atherosclerosis (ICD10-I70.0). Electronically Signed   By: Kerby Moors M.D.   On: 03/12/2021 10:54    ASSESSMENT AND PLAN: This is a very pleasant 70 years old white male recently diagnosed with extensive stage (T4, N3, M1 C) small cell lung cancer presented with large right upper lobe lung mass in addition to mediastinal and right supraclavicular lymphadenopathy and multiple metastatic liver lesions diagnosed in April 2021. The patient underwent systemic chemotherapy with carboplatin for AUC of 5 on day 1, etoposide 100 mg/M2 on days 1, 2 and 3 in addition to Cosela for myeloprotection during the days of the chemotherapy.  He will also receive immunotherapy with Imfinzi on day one of the chemotherapy cycle.  Status post 9 cycles.  Starting from cycle #5 he is on  maintenance treatment with Imfinzi 1500 mg IV every 4 weeks. The patient has been tolerating this treatment well with no concerning adverse effect except for the worsening cough and shortness of breath recently.   He had repeat CT scan of the chest, abdomen pelvis performed recently.  I personally and independently reviewed the scan images and discussed the results with the patient and his wife today. Unfortunately his scan showed evidence for disease progression with increase in the right pleural effusion as well as enlargement of multiple liver lesions. I recommended for the patient to discontinue his current maintenance treatment with Imfinzi at this point. He resumed systemic chemotherapy with carboplatin for AUC of 5 on day 1, etoposide 100 mg/M2 on days 1, 2 and 3 with Tecentriq 1200 mg every 3 weeks as well as Cosela on the days of the chemotherapy.  He is status post 5 cycles.   The patient has been tolerating his maintenance treatment with Tecentriq fairly well. I recommended for him to proceed with cycle #6 today as planned. He will come back for follow-up visit in 3 weeks for evaluation before starting cycle #7. For the cough, he will continue on Tussionex on as-needed basis. The patient was advised to call immediately if he has any concerning symptoms in the interval. The patient voices understanding of current disease status and treatment options and is in agreement with the current care plan.  All questions were answered. The patient knows to call the clinic with any problems, questions or concerns. We can certainly see the patient much sooner if necessary.  Disclaimer: This note was dictated with voice recognition software. Similar sounding words can inadvertently be transcribed and may not be corrected upon review.

## 2021-04-02 NOTE — Patient Instructions (Signed)
Cancer Center Discharge Instructions for Patients Receiving Chemotherapy  Today you received the following chemotherapy agents: Atezolizumab (Tecentriq)  To help prevent nausea and vomiting after your treatment, we encourage you to take your nausea medication as prescribed.    If you develop nausea and vomiting that is not controlled by your nausea medication, call the clinic.   BELOW ARE SYMPTOMS THAT SHOULD BE REPORTED IMMEDIATELY:  *FEVER GREATER THAN 100.5 F  *CHILLS WITH OR WITHOUT FEVER  NAUSEA AND VOMITING THAT IS NOT CONTROLLED WITH YOUR NAUSEA MEDICATION  *UNUSUAL SHORTNESS OF BREATH  *UNUSUAL BRUISING OR BLEEDING  TENDERNESS IN MOUTH AND THROAT WITH OR WITHOUT PRESENCE OF ULCERS  *URINARY PROBLEMS  *BOWEL PROBLEMS  UNUSUAL RASH Items with * indicate a potential emergency and should be followed up as soon as possible.  Feel free to call the clinic should you have any questions or concerns. The clinic phone number is [phone].  Please show the CHEMO ALERT CARD at check-in to the Emergency Department and triage nurse.   

## 2021-04-10 ENCOUNTER — Inpatient Hospital Stay: Payer: Medicare Other

## 2021-04-10 ENCOUNTER — Other Ambulatory Visit: Payer: Self-pay

## 2021-04-10 DIAGNOSIS — C3411 Malignant neoplasm of upper lobe, right bronchus or lung: Secondary | ICD-10-CM | POA: Diagnosis not present

## 2021-04-10 DIAGNOSIS — C787 Secondary malignant neoplasm of liver and intrahepatic bile duct: Secondary | ICD-10-CM | POA: Diagnosis not present

## 2021-04-10 DIAGNOSIS — Z5112 Encounter for antineoplastic immunotherapy: Secondary | ICD-10-CM | POA: Diagnosis not present

## 2021-04-10 DIAGNOSIS — Z79899 Other long term (current) drug therapy: Secondary | ICD-10-CM | POA: Diagnosis not present

## 2021-04-10 DIAGNOSIS — R059 Cough, unspecified: Secondary | ICD-10-CM | POA: Diagnosis not present

## 2021-04-10 DIAGNOSIS — R5383 Other fatigue: Secondary | ICD-10-CM

## 2021-04-10 DIAGNOSIS — J90 Pleural effusion, not elsewhere classified: Secondary | ICD-10-CM | POA: Diagnosis not present

## 2021-04-10 LAB — CBC WITH DIFFERENTIAL (CANCER CENTER ONLY)
Abs Immature Granulocytes: 0.01 10*3/uL (ref 0.00–0.07)
Basophils Absolute: 0.1 10*3/uL (ref 0.0–0.1)
Basophils Relative: 1 %
Eosinophils Absolute: 0.2 10*3/uL (ref 0.0–0.5)
Eosinophils Relative: 4 %
HCT: 43.2 % (ref 39.0–52.0)
Hemoglobin: 13.8 g/dL (ref 13.0–17.0)
Immature Granulocytes: 0 %
Lymphocytes Relative: 16 %
Lymphs Abs: 0.7 10*3/uL (ref 0.7–4.0)
MCH: 30.7 pg (ref 26.0–34.0)
MCHC: 31.9 g/dL (ref 30.0–36.0)
MCV: 96.2 fL (ref 80.0–100.0)
Monocytes Absolute: 0.4 10*3/uL (ref 0.1–1.0)
Monocytes Relative: 8 %
Neutro Abs: 3.3 10*3/uL (ref 1.7–7.7)
Neutrophils Relative %: 71 %
Platelet Count: 170 10*3/uL (ref 150–400)
RBC: 4.49 MIL/uL (ref 4.22–5.81)
RDW: 13.8 % (ref 11.5–15.5)
WBC Count: 4.6 10*3/uL (ref 4.0–10.5)
nRBC: 0 % (ref 0.0–0.2)

## 2021-04-10 LAB — CMP (CANCER CENTER ONLY)
ALT: 18 U/L (ref 0–44)
AST: 23 U/L (ref 15–41)
Albumin: 4.1 g/dL (ref 3.5–5.0)
Alkaline Phosphatase: 63 U/L (ref 38–126)
Anion gap: 10 (ref 5–15)
BUN: 14 mg/dL (ref 8–23)
CO2: 27 mmol/L (ref 22–32)
Calcium: 9.1 mg/dL (ref 8.9–10.3)
Chloride: 104 mmol/L (ref 98–111)
Creatinine: 1.28 mg/dL — ABNORMAL HIGH (ref 0.61–1.24)
GFR, Estimated: >60 mL/min (ref >60)
Glucose, Bld: 181 mg/dL — ABNORMAL HIGH (ref 70–99)
Potassium: 4.7 mmol/L (ref 3.5–5.1)
Sodium: 141 mmol/L (ref 135–145)
Total Bilirubin: 0.5 mg/dL (ref 0.3–1.2)
Total Protein: 6.6 g/dL (ref 6.5–8.1)

## 2021-04-10 LAB — TSH: TSH: 0.369 u[IU]/mL (ref 0.320–4.118)

## 2021-04-10 NOTE — Progress Notes (Signed)
ERROR

## 2021-04-17 ENCOUNTER — Other Ambulatory Visit: Payer: Self-pay

## 2021-04-17 ENCOUNTER — Inpatient Hospital Stay: Payer: Medicare Other | Attending: Internal Medicine

## 2021-04-17 DIAGNOSIS — Z79899 Other long term (current) drug therapy: Secondary | ICD-10-CM | POA: Insufficient documentation

## 2021-04-17 DIAGNOSIS — J90 Pleural effusion, not elsewhere classified: Secondary | ICD-10-CM | POA: Insufficient documentation

## 2021-04-17 DIAGNOSIS — R5383 Other fatigue: Secondary | ICD-10-CM

## 2021-04-17 DIAGNOSIS — Z5112 Encounter for antineoplastic immunotherapy: Secondary | ICD-10-CM | POA: Insufficient documentation

## 2021-04-17 DIAGNOSIS — C3411 Malignant neoplasm of upper lobe, right bronchus or lung: Secondary | ICD-10-CM

## 2021-04-17 LAB — CMP (CANCER CENTER ONLY)
ALT: 16 U/L (ref 0–44)
AST: 19 U/L (ref 15–41)
Albumin: 4 g/dL (ref 3.5–5.0)
Alkaline Phosphatase: 72 U/L (ref 38–126)
Anion gap: 11 (ref 5–15)
BUN: 21 mg/dL (ref 8–23)
CO2: 27 mmol/L (ref 22–32)
Calcium: 9.4 mg/dL (ref 8.9–10.3)
Chloride: 103 mmol/L (ref 98–111)
Creatinine: 1.27 mg/dL — ABNORMAL HIGH (ref 0.61–1.24)
GFR, Estimated: >60 mL/min (ref >60)
Glucose, Bld: 180 mg/dL — ABNORMAL HIGH (ref 70–99)
Potassium: 4.6 mmol/L (ref 3.5–5.1)
Sodium: 141 mmol/L (ref 135–145)
Total Bilirubin: 0.3 mg/dL (ref 0.3–1.2)
Total Protein: 6.7 g/dL (ref 6.5–8.1)

## 2021-04-17 LAB — TSH: TSH: 0.54 u[IU]/mL (ref 0.350–4.500)

## 2021-04-17 LAB — CBC WITH DIFFERENTIAL (CANCER CENTER ONLY)
Abs Immature Granulocytes: 0.01 10*3/uL (ref 0.00–0.07)
Basophils Absolute: 0.1 10*3/uL (ref 0.0–0.1)
Basophils Relative: 1 %
Eosinophils Absolute: 0.1 10*3/uL (ref 0.0–0.5)
Eosinophils Relative: 3 %
HCT: 42.7 % (ref 39.0–52.0)
Hemoglobin: 14 g/dL (ref 13.0–17.0)
Immature Granulocytes: 0 %
Lymphocytes Relative: 16 %
Lymphs Abs: 0.8 10*3/uL (ref 0.7–4.0)
MCH: 31 pg (ref 26.0–34.0)
MCHC: 32.8 g/dL (ref 30.0–36.0)
MCV: 94.7 fL (ref 80.0–100.0)
Monocytes Absolute: 0.4 10*3/uL (ref 0.1–1.0)
Monocytes Relative: 8 %
Neutro Abs: 3.6 10*3/uL (ref 1.7–7.7)
Neutrophils Relative %: 72 %
Platelet Count: 170 10*3/uL (ref 150–400)
RBC: 4.51 MIL/uL (ref 4.22–5.81)
RDW: 12.8 % (ref 11.5–15.5)
WBC Count: 5 10*3/uL (ref 4.0–10.5)
nRBC: 0 % (ref 0.0–0.2)

## 2021-04-18 ENCOUNTER — Telehealth: Payer: Self-pay | Admitting: Internal Medicine

## 2021-04-18 NOTE — Telephone Encounter (Signed)
Per 4/19 los, Patient will receive update calendar per next visit.

## 2021-04-23 ENCOUNTER — Other Ambulatory Visit: Payer: Self-pay | Admitting: Medical Oncology

## 2021-04-23 DIAGNOSIS — C3411 Malignant neoplasm of upper lobe, right bronchus or lung: Secondary | ICD-10-CM

## 2021-04-24 ENCOUNTER — Inpatient Hospital Stay (HOSPITAL_BASED_OUTPATIENT_CLINIC_OR_DEPARTMENT_OTHER): Payer: Medicare Other | Admitting: Internal Medicine

## 2021-04-24 ENCOUNTER — Inpatient Hospital Stay: Payer: Medicare Other

## 2021-04-24 ENCOUNTER — Other Ambulatory Visit: Payer: Medicare Other

## 2021-04-24 ENCOUNTER — Other Ambulatory Visit: Payer: Self-pay

## 2021-04-24 ENCOUNTER — Encounter: Payer: Self-pay | Admitting: Internal Medicine

## 2021-04-24 VITALS — HR 100

## 2021-04-24 DIAGNOSIS — R5382 Chronic fatigue, unspecified: Secondary | ICD-10-CM

## 2021-04-24 DIAGNOSIS — C3411 Malignant neoplasm of upper lobe, right bronchus or lung: Secondary | ICD-10-CM

## 2021-04-24 DIAGNOSIS — C3490 Malignant neoplasm of unspecified part of unspecified bronchus or lung: Secondary | ICD-10-CM | POA: Diagnosis not present

## 2021-04-24 DIAGNOSIS — Z95828 Presence of other vascular implants and grafts: Secondary | ICD-10-CM

## 2021-04-24 DIAGNOSIS — J90 Pleural effusion, not elsewhere classified: Secondary | ICD-10-CM | POA: Diagnosis not present

## 2021-04-24 DIAGNOSIS — Z79899 Other long term (current) drug therapy: Secondary | ICD-10-CM | POA: Diagnosis not present

## 2021-04-24 DIAGNOSIS — Z5112 Encounter for antineoplastic immunotherapy: Secondary | ICD-10-CM | POA: Diagnosis not present

## 2021-04-24 LAB — CBC WITH DIFFERENTIAL (CANCER CENTER ONLY)
Abs Immature Granulocytes: 0.01 10*3/uL (ref 0.00–0.07)
Basophils Absolute: 0.1 10*3/uL (ref 0.0–0.1)
Basophils Relative: 1 %
Eosinophils Absolute: 0.1 10*3/uL (ref 0.0–0.5)
Eosinophils Relative: 2 %
HCT: 43 % (ref 39.0–52.0)
Hemoglobin: 14 g/dL (ref 13.0–17.0)
Immature Granulocytes: 0 %
Lymphocytes Relative: 16 %
Lymphs Abs: 0.9 10*3/uL (ref 0.7–4.0)
MCH: 30.4 pg (ref 26.0–34.0)
MCHC: 32.6 g/dL (ref 30.0–36.0)
MCV: 93.5 fL (ref 80.0–100.0)
Monocytes Absolute: 0.5 10*3/uL (ref 0.1–1.0)
Monocytes Relative: 8 %
Neutro Abs: 4.2 10*3/uL (ref 1.7–7.7)
Neutrophils Relative %: 73 %
Platelet Count: 174 10*3/uL (ref 150–400)
RBC: 4.6 MIL/uL (ref 4.22–5.81)
RDW: 12.7 % (ref 11.5–15.5)
WBC Count: 5.8 10*3/uL (ref 4.0–10.5)
nRBC: 0 % (ref 0.0–0.2)

## 2021-04-24 LAB — CMP (CANCER CENTER ONLY)
ALT: 14 U/L (ref 0–44)
AST: 21 U/L (ref 15–41)
Albumin: 4 g/dL (ref 3.5–5.0)
Alkaline Phosphatase: 67 U/L (ref 38–126)
Anion gap: 11 (ref 5–15)
BUN: 15 mg/dL (ref 8–23)
CO2: 27 mmol/L (ref 22–32)
Calcium: 9.4 mg/dL (ref 8.9–10.3)
Chloride: 103 mmol/L (ref 98–111)
Creatinine: 1.37 mg/dL — ABNORMAL HIGH (ref 0.61–1.24)
GFR, Estimated: 55 mL/min — ABNORMAL LOW (ref >60)
Glucose, Bld: 195 mg/dL — ABNORMAL HIGH (ref 70–99)
Potassium: 4.3 mmol/L (ref 3.5–5.1)
Sodium: 141 mmol/L (ref 135–145)
Total Bilirubin: 0.3 mg/dL (ref 0.3–1.2)
Total Protein: 6.5 g/dL (ref 6.5–8.1)

## 2021-04-24 LAB — TSH: TSH: 0.463 u[IU]/mL (ref 0.320–4.118)

## 2021-04-24 MED ORDER — HEPARIN SOD (PORK) LOCK FLUSH 100 UNIT/ML IV SOLN
500.0000 [IU] | Freq: Once | INTRAVENOUS | Status: AC | PRN
  Administered 2021-04-24: 500 [IU]
  Filled 2021-04-24: qty 5

## 2021-04-24 MED ORDER — ATEZOLIZUMAB CHEMO INJECTION 1200 MG/20ML
1200.0000 mg | Freq: Once | INTRAVENOUS | Status: AC
  Administered 2021-04-24: 1200 mg via INTRAVENOUS
  Filled 2021-04-24: qty 20

## 2021-04-24 MED ORDER — SODIUM CHLORIDE 0.9% FLUSH
10.0000 mL | INTRAVENOUS | Status: DC | PRN
  Administered 2021-04-24: 10 mL
  Filled 2021-04-24: qty 10

## 2021-04-24 MED ORDER — HEPARIN SOD (PORK) LOCK FLUSH 100 UNIT/ML IV SOLN
250.0000 [IU] | Freq: Once | INTRAVENOUS | Status: DC | PRN
  Filled 2021-04-24: qty 5

## 2021-04-24 MED ORDER — SODIUM CHLORIDE 0.9 % IV SOLN
Freq: Once | INTRAVENOUS | Status: AC
Start: 2021-04-24 — End: 2021-04-24
  Filled 2021-04-24: qty 250

## 2021-04-24 MED ORDER — SODIUM CHLORIDE 0.9% FLUSH
10.0000 mL | Freq: Once | INTRAVENOUS | Status: AC
  Administered 2021-04-24: 10 mL
  Filled 2021-04-24: qty 10

## 2021-04-24 NOTE — Patient Instructions (Signed)
[facility]  Discharge Instructions: Thank you for choosing [facility] to provide your oncology and hematology care.   If you have a lab appointment with the [facility], please go directly to the [facility] and check in at the registration area.   Wear comfortable clothing and clothing appropriate for easy access to any Portacath or PICC line.   We strive to give you quality time with your provider. You may need to reschedule your appointment if you arrive late (15 or more minutes).  Arriving late affects you and other patients whose appointments are after yours.  Also, if you miss three or more appointments without notifying the office, you may be dismissed from the clinic at the provider's discretion.      For prescription refill requests, have your pharmacy contact our office and allow 72 hours for refills to be completed.    Today you received the following chemotherapy and/or immunotherapy agents Atezolizumab.      To help prevent nausea and vomiting after your treatment, we encourage you to take your nausea medication as directed.  BELOW ARE SYMPTOMS THAT SHOULD BE REPORTED IMMEDIATELY: . *FEVER GREATER THAN 100.4 F (38 C) OR HIGHER . *CHILLS OR SWEATING . *NAUSEA AND VOMITING THAT IS NOT CONTROLLED WITH YOUR NAUSEA MEDICATION . *UNUSUAL SHORTNESS OF BREATH . *UNUSUAL BRUISING OR BLEEDING . *URINARY PROBLEMS (pain or burning when urinating, or frequent urination) . *BOWEL PROBLEMS (unusual diarrhea, constipation, pain near the anus) . TENDERNESS IN MOUTH AND THROAT WITH OR WITHOUT PRESENCE OF ULCERS (sore throat, sores in mouth, or a toothache) . UNUSUAL RASH, SWELLING OR PAIN  . UNUSUAL VAGINAL DISCHARGE OR ITCHING   Items with * indicate a potential emergency and should be followed up as soon as possible or go to the Emergency Department if any problems should occur.  Please show the CHEMOTHERAPY ALERT CARD or IMMUNOTHERAPY  ALERT CARD at check-in to the Emergency Department and triage nurse.  Should you have questions after your visit or need to cancel or reschedule your appointment, please contact [facility]  Dept: [phone]  and follow the prompts.  Office hours are 8:00 a.m. to 4:30 p.m. Monday - Friday. Please note that voicemails left after 4:00 p.m. may not be returned until the following business day.  We are closed weekends and major holidays. You have access to a nurse at all times for urgent questions. Please call the main number to the clinic Dept: [phone] and follow the prompts.   For any non-urgent questions, you may also contact your provider using MyChart. We now offer e-Visits for anyone 69 and older to request care online for non-urgent symptoms. For details visit [email or web address].   Also download the MyChart app! Go to the app store, search "MyChart", open the app, select [facility], and log in with your MyChart username and password.  Due to Covid, a mask is required upon entering the hospital/clinic. If you do not have a mask, one will be given to you upon arrival. For doctor visits, patients may have 1 support person aged 54 or older with them. For treatment visits, patients cannot have anyone with them due to current Covid guidelines and our immunocompromised population.

## 2021-04-24 NOTE — Progress Notes (Signed)
Okay to treat with elevated heart rate per Dr. Julien Nordmann.

## 2021-04-24 NOTE — Progress Notes (Signed)
[name] [phone]   [phone]  OFFICE PROGRESS NOTE  [name], NP [place] [address]  DIAGNOSIS: Relapsed extensive stage (T4, N3, M1c)  small cell lung cancer diagnosed in April 2021 and presented with large right upper lobe/right hilar mass with ipsilateral and contralateral mediastinal and right supraclavicular lymphadenopathy in addition to multiple liver lesios. He has disease progression after the first line of chemotherapy in December 2021.  PRIOR THERAPY:  1) Palliative radiotherapy to the right upper lobe lung mass under the care of Dr. Lisbeth Renshaw. 2) Systemic chemotherapy with carboplatin for AUC of 5 on day 1, etoposide 100 mg/M2 on days 1, 2 and 3 in addition to Imfinzi 1500 mg IV every 3 weeks with chemotherapy then every 4 weeks for maintenance if the patient has no evidence for progression.  He will also receive Cosela 240 mg/m2 on the days of the chemotherapy.  Status post 9 cycles.  Starting from cycle #5 the patient will be on maintenance treatment with immunotherapy with Imfinzi 1500 mg IV every 4 weeks. Last dose of chemotherapy was given on November 13, 2020. This treatment was discontinued secondary to disease progression.  CURRENT THERAPY: Systemic chemotherapy with carboplatin for AUC of 5 on day 1, etoposide 100 mg/M2 on days 1, 2 and 3, Tecentriq 1200 mg IV every 3 weeks as well as Cosela 250 mg/M2 on the days of the chemotherapy every 3 weeks.  First dose December 18, 2020.  Status post 6 cycles.  INTERVAL HISTORY: Adam Wise 70 y.o. male returns to the clinic today for follow-up visit accompanied by his wife.  The patient is feeling fine today with no concerning complaints.  He denied having any current chest pain but has shortness of breath with exertion with no cough or hemoptysis.  He has no nausea, vomiting, diarrhea or constipation.  He has no headache or visual changes.  The patient has no  weight loss or night sweats.  He has no headache or visual changes.  He continues to tolerate his treatment with Tecentriq fairly well.  The patient is here today for evaluation before starting cycle #7.  MEDICAL HISTORY: Past Medical History:  Diagnosis Date  . Cancer ([place])   . Chickenpox   . Chronic knee pain   . Chronic low back pain   . Essential hypertension   . GERD (gastroesophageal reflux disease)   . OSA (obstructive sleep apnea)   . Testosterone deficiency   . Type 2 diabetes mellitus (HCC)     ALLERGIES:  is allergic to bupropion.  MEDICATIONS:  Current Outpatient Medications  Medication Sig Dispense Refill  . albuterol (VENTOLIN HFA) 108 (90 Base) MCG/ACT inhaler Inhale 2 puffs into the lungs every 6 (six) hours as needed for wheezing or shortness of breath. 18 g 0  . amLODipine (NORVASC) 10 MG tablet TAKE 1 TABLET BY MOUTH EVERY DAY 90 tablet 1  . B-D 3CC LUER-LOK SYR 22GX1" 22G X 1" 3 ML MISC USE AS INSTRUCTED FOR TESTOSTERONE INJECTION EVERY 2 WEEKS 10 each 2  . Black Pepper-Turmeric (TURMERIC COMPLEX/BLACK PEPPER PO) Take 1 tablet by mouth in the morning and at bedtime.    . blood glucose meter kit and supplies KIT Dispense based on patient and insurance preference. Use up to four times daily as directed. 1 each 0  . chlorpheniramine-HYDROcodone (TUSSIONEX) 10-8 MG/5ML SUER Take 5 mLs by mouth at bedtime as needed for cough. [place]  mL 0  . Coenzyme Q10 ([employer] COQ-10) 200 MG capsule Take 200 mg by mouth every evening.     Marland Kitchen ELIQUIS 5 MG TABS tablet TAKE 1 TABLET BY MOUTH TWICE A DAY 180 tablet 2  . esomeprazole (NEXIUM) 20 MG capsule Take 20 mg by mouth daily.     . fluticasone (FLONASE) 50 MCG/ACT nasal spray Place 2 sprays into both nostrils daily as needed for allergies.    Marland Kitchen gabapentin (NEURONTIN) 300 MG capsule Take 300 mg by mouth 2 (two) times daily.     Marland Kitchen glipiZIDE (GLUCOTROL) 10 MG tablet TAKE 1 TABLET (10 MG TOTAL) BY MOUTH 2 (TWO) TIMES DAILY BEFORE A MEAL. FOR  DIABETES. 180 tablet 2  . glucose blood (ONETOUCH ULTRA) test strip USE UP TO 4 TIMES DAILY AS DIRECTED 100 strip 5  . Insulin Glargine (BASAGLAR KWIKPEN) 100 UNIT/ML Inject 20 Units into the skin at bedtime. For diabetes. 15 mL 3  . Insulin Pen Needle (BD PEN NEEDLE NANO U/F) 32G X 4 MM MISC Use with insulin as prescribed  Dx Code: E11.9 100 each 2  . KRILL OIL PO Take 350 mg by mouth daily.    . Lancets MISC USE UP TO 3 TIMES DAILY AS DIRECTED 100 each 2  . lidocaine-prilocaine (EMLA) cream Apply 1 application topically as needed. 30 g 2  . lisinopril (ZESTRIL) 5 MG tablet TAKE 1 TABLET BY MOUTH EVERY DAY 90 tablet 1  . Melatonin 10 MG CAPS Take 10 mg by mouth at bedtime.    . metFORMIN (GLUCOPHAGE) 1000 MG tablet TAKE 1 TABLET (1,000 MG TOTAL) BY MOUTH 2 (TWO) TIMES DAILY WITH A MEAL. 180 tablet 0  . Multiple Vitamin (MULTI-VITAMINS) TABS Take 1 tablet by mouth daily.     Marland Kitchen OVER THE COUNTER MEDICATION Take by mouth.    Marland Kitchen oxymetazoline (AFRIN) 0.05 % nasal spray Place 1 spray into both nostrils 2 (two) times daily as needed for congestion.    . pioglitazone (ACTOS) 45 MG tablet Take 1 tablet (45 mg total) by mouth daily. For diabetes. 90 tablet 2  . pravastatin (PRAVACHOL) 40 MG tablet TAKE 1 TABLET BY MOUTH EVERY DAY IN THE EVENING FOR CHOLESTEROL 90 tablet 1  . prochlorperazine (COMPAZINE) 10 MG tablet Take 1 tablet (10 mg total) by mouth every 6 (six) hours as needed for nausea or vomiting. 30 tablet 0  . sildenafil (REVATIO) 20 MG tablet Take 3-5 tablets by mouth 1 hour prior to intercourse as needed. 50 tablet 0  . testosterone cypionate (DEPOTESTOSTERONE CYPIONATE) 200 MG/ML injection Inject 200 mg into the muscle every 14 (fourteen) days.     No current facility-administered medications for this visit.    SURGICAL HISTORY:  Past Surgical History:  Procedure Laterality Date  . COLONOSCOPY WITH PROPOFOL N/A 12/17/2018   Procedure: COLONOSCOPY WITH PROPOFOL;  Surgeon: Virgel Manifold, MD;  Location: [facility] ENDOSCOPY;  Service: Gastroenterology;  Laterality: N/A;  . IR IMAGING GUIDED PORT INSERTION  04/17/2020  . JOINT REPLACEMENT Bilateral   . REPLACEMENT TOTAL KNEE BILATERAL  2015  . TONSILLECTOMY  1960    REVIEW OF SYSTEMS:  A comprehensive review of systems was negative except for: Constitutional: positive for fatigue Respiratory: positive for dyspnea on exertion   PHYSICAL EXAMINATION: General appearance: alert, cooperative, fatigued and no distress Head: atraumatic Neck: no adenopathy, no JVD, supple, symmetrical, trachea midline and thyroid not enlarged, symmetric, no tenderness/mass/nodules Lymph nodes: Cervical, supraclavicular, and axillary nodes normal. Resp: diminished breath  sounds RLL and dullness to percussion RLL Back: symmetric, no curvature. ROM normal. No CVA tenderness. Cardio: regular rate and rhythm, S1, S2 normal, no murmur, click, rub or gallop GI: soft, non-tender; bowel sounds normal; no masses,  no organomegaly Extremities: extremities normal, atraumatic, no cyanosis or edema  ECOG PERFORMANCE STATUS: 1 - Symptomatic but completely ambulatory  Blood pressure (!) 133/93, pulse (!) 103, temperature 98.1 F (36.7 C), temperature source Tympanic, resp. rate 20, height $RemoveBe'5\' 9"'hjyIcBZSh$  (1.753 m), weight 287 lb 6.4 oz (130.4 kg), SpO2 95 %.  LABORATORY DATA: Lab Results  Component Value Date   WBC 5.8 04/24/2021   HGB 14.0 04/24/2021   HCT 43.0 04/24/2021   MCV 93.5 04/24/2021   PLT 174 04/24/2021      Chemistry      Component Value Date/Time   NA 141 04/24/2021 1032   K 4.3 04/24/2021 1032   CL 103 04/24/2021 1032   CO2 27 04/24/2021 1032   BUN 15 04/24/2021 1032   CREATININE 1.37 (H) 04/24/2021 1032      Component Value Date/Time   CALCIUM 9.4 04/24/2021 1032   ALKPHOS 67 04/24/2021 1032   AST 21 04/24/2021 1032   ALT 14 04/24/2021 1032   BILITOT 0.3 04/24/2021 1032       RADIOGRAPHIC STUDIES: No results  found.  ASSESSMENT AND PLAN: This is a very pleasant 70 years old white male recently diagnosed with extensive stage (T4, N3, M1 C) small cell lung cancer presented with large right upper lobe lung mass in addition to mediastinal and right supraclavicular lymphadenopathy and multiple metastatic liver lesions diagnosed in April 2021. The patient underwent systemic chemotherapy with carboplatin for AUC of 5 on day 1, etoposide 100 mg/M2 on days 1, 2 and 3 in addition to Cosela for myeloprotection during the days of the chemotherapy.  He will also receive immunotherapy with Imfinzi on day one of the chemotherapy cycle.  Status post 9 cycles.  Starting from cycle #5 he is on maintenance treatment with Imfinzi 1500 mg IV every 4 weeks. The patient has been tolerating this treatment well with no concerning adverse effect except for the worsening cough and shortness of breath recently.   He had repeat CT scan of the chest, abdomen pelvis performed recently.  I personally and independently reviewed the scan images and discussed the results with the patient and his wife today. Unfortunately his scan showed evidence for disease progression with increase in the right pleural effusion as well as enlargement of multiple liver lesions. I recommended for the patient to discontinue his current maintenance treatment with Imfinzi at this point. He resumed systemic chemotherapy with carboplatin for AUC of 5 on day 1, etoposide 100 mg/M2 on days 1, 2 and 3 with Tecentriq 1200 mg every 3 weeks as well as Cosela on the days of the chemotherapy.  He is status post 6 cycles.   The patient has been tolerating this treatment well with no concerning adverse effect except for the baseline shortness of breath secondary to recurrent right pleural effusion. I recommended for him to proceed with cycle #7 of his treatment today with Tecentriq. I will see him back for follow-up visit in 3 weeks for evaluation with repeat CT scan of the  chest, abdomen pelvis for restaging of his disease. For the recurrent pleural effusion, I will consider The patient for repeat ultrasound-guided right thoracentesis on as-needed basis. The patient was advised to call immediately if he has any concerning symptoms in the interval.  The patient voices understanding of current disease status and treatment options and is in agreement with the current care plan.  All questions were answered. The patient knows to call the clinic with any problems, questions or concerns. We can certainly see the patient much sooner if necessary.  Disclaimer: This note was dictated with voice recognition software. Similar sounding words can inadvertently be transcribed and may not be corrected upon review.

## 2021-04-25 ENCOUNTER — Telehealth: Payer: Self-pay | Admitting: Internal Medicine

## 2021-04-25 NOTE — Telephone Encounter (Signed)
Scheduled per los. Called and spoke with patient. Confirmed appt 

## 2021-04-26 ENCOUNTER — Encounter: Payer: Self-pay | Admitting: Internal Medicine

## 2021-04-26 ENCOUNTER — Other Ambulatory Visit: Payer: Self-pay | Admitting: Medical Oncology

## 2021-04-26 DIAGNOSIS — R059 Cough, unspecified: Secondary | ICD-10-CM

## 2021-04-26 MED ORDER — HYDROCOD POLST-CPM POLST ER 10-8 MG/5ML PO SUER: 5.0000 mL | Freq: Every evening | ORAL | 0 refills | Status: DC | PRN

## 2021-04-26 NOTE — Telephone Encounter (Signed)
Tussionx refill request- Sent to [facility] for authorization.

## 2021-04-29 ENCOUNTER — Ambulatory Visit: Payer: Medicare Other | Admitting: Primary Care

## 2021-05-07 ENCOUNTER — Telehealth: Payer: Self-pay | Admitting: Medical Oncology

## 2021-05-07 NOTE — Telephone Encounter (Signed)
Asking for re certification for oxygen.I did not understand the  name of company or the person's name . She did not leave a return number.

## 2021-05-08 ENCOUNTER — Encounter: Payer: Self-pay | Admitting: Internal Medicine

## 2021-05-08 NOTE — Telephone Encounter (Signed)
I called Radiology Scheduling to determine if there were any Saturday appts available. There is one in [place]. I spoke with pts wife and advised of this but pt does not want to make the trip there (they live in [place]). Pts wife states she will call back to schedule the very next available CT appt.  Dr. Julien Nordmann made aware of this.

## 2021-05-09 ENCOUNTER — Encounter: Payer: Self-pay | Admitting: Internal Medicine

## 2021-05-14 ENCOUNTER — Inpatient Hospital Stay: Payer: Medicare Other

## 2021-05-14 ENCOUNTER — Encounter: Payer: Self-pay | Admitting: Internal Medicine

## 2021-05-14 ENCOUNTER — Other Ambulatory Visit: Payer: Self-pay

## 2021-05-14 ENCOUNTER — Inpatient Hospital Stay (HOSPITAL_BASED_OUTPATIENT_CLINIC_OR_DEPARTMENT_OTHER): Payer: Medicare Other | Admitting: Internal Medicine

## 2021-05-14 VITALS — BP 134/87 | HR 91 | Temp 98.0°F | Resp 20 | Ht 69.0 in | Wt 295.2 lb

## 2021-05-14 DIAGNOSIS — I10 Essential (primary) hypertension: Secondary | ICD-10-CM | POA: Diagnosis not present

## 2021-05-14 DIAGNOSIS — Z79899 Other long term (current) drug therapy: Secondary | ICD-10-CM | POA: Diagnosis not present

## 2021-05-14 DIAGNOSIS — C3411 Malignant neoplasm of upper lobe, right bronchus or lung: Secondary | ICD-10-CM

## 2021-05-14 DIAGNOSIS — Z5112 Encounter for antineoplastic immunotherapy: Secondary | ICD-10-CM

## 2021-05-14 DIAGNOSIS — J90 Pleural effusion, not elsewhere classified: Secondary | ICD-10-CM | POA: Diagnosis not present

## 2021-05-14 DIAGNOSIS — R5383 Other fatigue: Secondary | ICD-10-CM

## 2021-05-14 DIAGNOSIS — Z95828 Presence of other vascular implants and grafts: Secondary | ICD-10-CM

## 2021-05-14 LAB — CMP (CANCER CENTER ONLY)
ALT: 18 U/L (ref 0–44)
AST: 19 U/L (ref 15–41)
Albumin: 3.7 g/dL (ref 3.5–5.0)
Alkaline Phosphatase: 62 U/L (ref 38–126)
Anion gap: 11 (ref 5–15)
BUN: 14 mg/dL (ref 8–23)
CO2: 26 mmol/L (ref 22–32)
Calcium: 9 mg/dL (ref 8.9–10.3)
Chloride: 103 mmol/L (ref 98–111)
Creatinine: 1.14 mg/dL (ref 0.61–1.24)
GFR, Estimated: >60 mL/min (ref >60)
Glucose, Bld: 129 mg/dL — ABNORMAL HIGH (ref 70–99)
Potassium: 4.5 mmol/L (ref 3.5–5.1)
Sodium: 140 mmol/L (ref 135–145)
Total Bilirubin: 0.2 mg/dL — ABNORMAL LOW (ref 0.3–1.2)
Total Protein: 6.3 g/dL — ABNORMAL LOW (ref 6.5–8.1)

## 2021-05-14 LAB — CBC WITH DIFFERENTIAL (CANCER CENTER ONLY)
Abs Immature Granulocytes: 0.02 10*3/uL (ref 0.00–0.07)
Basophils Absolute: 0 10*3/uL (ref 0.0–0.1)
Basophils Relative: 1 %
Eosinophils Absolute: 0.1 10*3/uL (ref 0.0–0.5)
Eosinophils Relative: 2 %
HCT: 41.6 % (ref 39.0–52.0)
Hemoglobin: 13.5 g/dL (ref 13.0–17.0)
Immature Granulocytes: 0 %
Lymphocytes Relative: 18 %
Lymphs Abs: 0.9 10*3/uL (ref 0.7–4.0)
MCH: 30.2 pg (ref 26.0–34.0)
MCHC: 32.5 g/dL (ref 30.0–36.0)
MCV: 93.1 fL (ref 80.0–100.0)
Monocytes Absolute: 0.5 10*3/uL (ref 0.1–1.0)
Monocytes Relative: 10 %
Neutro Abs: 3.6 10*3/uL (ref 1.7–7.7)
Neutrophils Relative %: 69 %
Platelet Count: 180 10*3/uL (ref 150–400)
RBC: 4.47 MIL/uL (ref 4.22–5.81)
RDW: 12.7 % (ref 11.5–15.5)
WBC Count: 5.2 10*3/uL (ref 4.0–10.5)
nRBC: 0 % (ref 0.0–0.2)

## 2021-05-14 LAB — TSH: TSH: 0.564 u[IU]/mL (ref 0.320–4.118)

## 2021-05-14 MED ORDER — SODIUM CHLORIDE 0.9% FLUSH
10.0000 mL | Freq: Once | INTRAVENOUS | Status: AC
  Administered 2021-05-14: 10 mL
  Filled 2021-05-14: qty 10

## 2021-05-14 MED ORDER — SODIUM CHLORIDE 0.9% FLUSH
10.0000 mL | INTRAVENOUS | Status: DC | PRN
  Administered 2021-05-14: 10 mL
  Filled 2021-05-14: qty 10

## 2021-05-14 MED ORDER — SODIUM CHLORIDE 0.9 % IV SOLN
Freq: Once | INTRAVENOUS | Status: AC
Start: 2021-05-14 — End: 2021-05-14
  Filled 2021-05-14: qty 250

## 2021-05-14 MED ORDER — HEPARIN SOD (PORK) LOCK FLUSH 100 UNIT/ML IV SOLN
500.0000 [IU] | Freq: Once | INTRAVENOUS | Status: AC | PRN
  Administered 2021-05-14: 500 [IU]
  Filled 2021-05-14: qty 5

## 2021-05-14 MED ORDER — SODIUM CHLORIDE 0.9 % IV SOLN
1200.0000 mg | Freq: Once | INTRAVENOUS | Status: AC
  Administered 2021-05-14: 1200 mg via INTRAVENOUS
  Filled 2021-05-14: qty 20

## 2021-05-14 NOTE — Progress Notes (Signed)
SATURATION QUALIFICATIONS: (This note is used to comply with regulatory documentation for home oxygen)  Patient Saturations on Room Air at Rest = 96%  Patient Saturations on Room Air while Ambulating = 98%

## 2021-05-14 NOTE — Progress Notes (Signed)
[place] [place] [phone]   Fax:(336) [phone]  OFFICE PROGRESS NOTE  [name], NP [place] [address]  DIAGNOSIS: Relapsed extensive stage (T4, N3, M1c)  small cell lung cancer diagnosed in April 2021 and presented with large right upper lobe/right hilar mass with ipsilateral and contralateral mediastinal and right supraclavicular lymphadenopathy in addition to multiple liver lesios. He has disease progression after the first line of chemotherapy in December 2021.  PRIOR THERAPY:  1) Palliative radiotherapy to the right upper lobe lung mass under the care of Dr. Lisbeth Renshaw. 2) Systemic chemotherapy with carboplatin for AUC of 5 on day 1, etoposide 100 mg/M2 on days 1, 2 and 3 in addition to Imfinzi 1500 mg IV every 3 weeks with chemotherapy then every 4 weeks for maintenance if the patient has no evidence for progression.  He will also receive Cosela 240 mg/m2 on the days of the chemotherapy.  Status post 9 cycles.  Starting from cycle #5 the patient will be on maintenance treatment with immunotherapy with Imfinzi 1500 mg IV every 4 weeks. Last dose of chemotherapy was given on November 13, 2020. This treatment was discontinued secondary to disease progression.  CURRENT THERAPY: Systemic chemotherapy with carboplatin for AUC of 5 on day 1, etoposide 100 mg/M2 on days 1, 2 and 3, Tecentriq 1200 mg IV every 3 weeks as well as Cosela 250 mg/M2 on the days of the chemotherapy every 3 weeks.  First dose December 18, 2020.  Status post 7 cycles.  INTERVAL HISTORY: Adam Wise 70 y.o. male returns to the clinic today for follow-up visit accompanied by his wife.  The patient is feeling fine today with no concerning complaints except for the baseline shortness of breath increased with exertion.  He denied having any chest pain, cough or hemoptysis.  He denied having any fever or chills.  He has no nausea, vomiting, diarrhea or constipation.  He has no  headache or visual changes.  He is here today for evaluation before starting cycle #8 of his treatment.  MEDICAL HISTORY: Past Medical History:  Diagnosis Date  . Cancer ([place])   . Chickenpox   . Chronic knee pain   . Chronic low back pain   . Essential hypertension   . GERD (gastroesophageal reflux disease)   . OSA (obstructive sleep apnea)   . Testosterone deficiency   . Type 2 diabetes mellitus ([facility]CC)     ALLERGIES:  is allergic to bupropion.  MEDICATIONS:  Current Outpatient Medications  Medication Sig Dispense Refill  . albuterol (VENTOLIN HFA) 108 (90 Base) MCG/ACT inhaler Inhale 2 puffs into the lungs every 6 (six) hours as needed for wheezing or shortness of breath. 18 g 0  . amLODipine (NORVASC) 10 MG tablet TAKE 1 TABLET BY MOUTH EVERY DAY 90 tablet 1  . B-D 3CC LUER-LOK SYR 22GX1" 22G X 1" 3 ML MISC USE AS INSTRUCTED FOR TESTOSTERONE INJECTION EVERY 2 WEEKS 10 each 2  . Black Pepper-Turmeric (TURMERIC COMPLEX/BLACK PEPPER PO) Take 1 tablet by mouth in the morning and at bedtime.    . blood glucose meter kit and supplies KIT Dispense based on patient and insurance preference. Use up to four times daily as directed. 1 each 0  . chlorpheniramine-HYDROcodone (TUSSIONEX) 10-8 MG/5ML SUER Take 5 mLs by mouth at bedtime as needed for cough. 140 mL 0  . Coenzyme Q10 ([employer]S COQ-10) 200 MG capsule Take 200 mg by mouth every evening.     Marland Kitchen  ELIQUIS 5 MG TABS tablet TAKE 1 TABLET BY MOUTH TWICE A DAY 180 tablet 2  . esomeprazole (NEXIUM) 20 MG capsule Take 20 mg by mouth daily.     . fluticasone (FLONASE) 50 MCG/ACT nasal spray Place 2 sprays into both nostrils daily as needed for allergies.    Marland Kitchen gabapentin (NEURONTIN) 300 MG capsule Take 300 mg by mouth 2 (two) times daily.     Marland Kitchen glipiZIDE (GLUCOTROL) 10 MG tablet TAKE 1 TABLET (10 MG TOTAL) BY MOUTH 2 (TWO) TIMES DAILY BEFORE A MEAL. FOR DIABETES. 180 tablet 2  . glucose blood (ONETOUCH ULTRA) test strip USE UP TO 4 TIMES DAILY AS  DIRECTED 100 strip 5  . Insulin Glargine (BASAGLAR KWIKPEN) 100 UNIT/ML Inject 20 Units into the skin at bedtime. For diabetes. 15 mL 3  . Insulin Pen Needle (BD PEN NEEDLE NANO U/F) 32G X 4 MM MISC Use with insulin as prescribed  Dx Code: E11.9 100 each 2  . KRILL OIL PO Take 350 mg by mouth daily.    . Lancets MISC USE UP TO 3 TIMES DAILY AS DIRECTED 100 each 2  . lidocaine-prilocaine (EMLA) cream Apply 1 application topically as needed. 30 g 2  . lisinopril (ZESTRIL) 5 MG tablet TAKE 1 TABLET BY MOUTH EVERY DAY 90 tablet 1  . Melatonin 10 MG CAPS Take 10 mg by mouth at bedtime.    . metFORMIN (GLUCOPHAGE) 1000 MG tablet TAKE 1 TABLET (1,000 MG TOTAL) BY MOUTH 2 (TWO) TIMES DAILY WITH A MEAL. 180 tablet 0  . Multiple Vitamin (MULTI-VITAMINS) TABS Take 1 tablet by mouth daily.     Marland Kitchen OVER THE COUNTER MEDICATION Take by mouth.    Marland Kitchen oxymetazoline (AFRIN) 0.05 % nasal spray Place 1 spray into both nostrils 2 (two) times daily as needed for congestion.    . pioglitazone (ACTOS) 45 MG tablet Take 1 tablet (45 mg total) by mouth daily. For diabetes. 90 tablet 2  . pravastatin (PRAVACHOL) 40 MG tablet TAKE 1 TABLET BY MOUTH EVERY DAY IN THE EVENING FOR CHOLESTEROL 90 tablet 1  . prochlorperazine (COMPAZINE) 10 MG tablet Take 1 tablet (10 mg total) by mouth every 6 (six) hours as needed for nausea or vomiting. 30 tablet 0  . sildenafil (REVATIO) 20 MG tablet Take 3-5 tablets by mouth 1 hour prior to intercourse as needed. 50 tablet 0  . testosterone cypionate (DEPOTESTOSTERONE CYPIONATE) 200 MG/ML injection Inject 200 mg into the muscle every 14 (fourteen) days.     No current facility-administered medications for this visit.    SURGICAL HISTORY:  Past Surgical History:  Procedure Laterality Date  . COLONOSCOPY WITH PROPOFOL N/A 12/17/2018   Procedure: COLONOSCOPY WITH PROPOFOL;  Surgeon: Virgel Manifold, MD;  Location: [facility] ENDOSCOPY;  Service: Gastroenterology;  Laterality: N/A;  . IR  IMAGING GUIDED PORT INSERTION  04/17/2020  . JOINT REPLACEMENT Bilateral   . REPLACEMENT TOTAL KNEE BILATERAL  2015  . TONSILLECTOMY  1960    REVIEW OF SYSTEMS:  A comprehensive review of systems was negative except for: Respiratory: positive for dyspnea on exertion   PHYSICAL EXAMINATION: General appearance: alert, cooperative, fatigued and no distress Head: atraumatic Neck: no adenopathy, no JVD, supple, symmetrical, trachea midline and thyroid not enlarged, symmetric, no tenderness/mass/nodules Lymph nodes: Cervical, supraclavicular, and axillary nodes normal. Resp: clear to auscultation bilaterally Back: symmetric, no curvature. ROM normal. No CVA tenderness. Cardio: regular rate and rhythm, S1, S2 normal, no murmur, click, rub or gallop GI: soft,  non-tender; bowel sounds normal; no masses,  no organomegaly Extremities: extremities normal, atraumatic, no cyanosis or edema  ECOG PERFORMANCE STATUS: 1 - Symptomatic but completely ambulatory  Blood pressure 134/87, pulse 91, temperature 98 F (36.7 C), temperature source Oral, resp. rate 20, height $RemoveBe'5\' 9"'[id number]$  (1.753 m), weight 295 lb 3.2 oz (133.9 kg), SpO2 96 %.  LABORATORY DATA: Lab Results  Component Value Date   WBC 5.2 05/14/2021   HGB 13.5 05/14/2021   HCT 41.6 05/14/2021   MCV 93.1 05/14/2021   PLT 180 05/14/2021      Chemistry      Component Value Date/Time   NA 141 04/24/2021 1032   K 4.3 04/24/2021 1032   CL 103 04/24/2021 1032   CO2 27 04/24/2021 1032   BUN 15 04/24/2021 1032   CREATININE 1.37 (H) 04/24/2021 1032      Component Value Date/Time   CALCIUM 9.4 04/24/2021 1032   ALKPHOS 67 04/24/2021 1032   AST 21 04/24/2021 1032   ALT 14 04/24/2021 1032   BILITOT 0.3 04/24/2021 1032       RADIOGRAPHIC STUDIES: No results found.  ASSESSMENT AND PLAN: This is a very pleasant 70 years old white male recently diagnosed with extensive stage (T4, N3, M1 C) small cell lung cancer presented with large right upper  lobe lung mass in addition to mediastinal and right supraclavicular lymphadenopathy and multiple metastatic liver lesions diagnosed in April 2021. The patient underwent systemic chemotherapy with carboplatin for AUC of 5 on day 1, etoposide 100 mg/M2 on days 1, 2 and 3 in addition to Cosela for myeloprotection during the days of the chemotherapy.  He will also receive immunotherapy with Imfinzi on day one of the chemotherapy cycle.  Status post 9 cycles.  Starting from cycle #5 he is on maintenance treatment with Imfinzi 1500 mg IV every 4 weeks. The patient has been tolerating this treatment well with no concerning adverse effect except for the worsening cough and shortness of breath recently.   He had repeat CT scan of the chest, abdomen pelvis performed recently.  I personally and independently reviewed the scan images and discussed the results with the patient and his wife today. Unfortunately his scan showed evidence for disease progression with increase in the right pleural effusion as well as enlargement of multiple liver lesions. I recommended for the patient to discontinue his current maintenance treatment with Imfinzi at this point. He resumed systemic chemotherapy with carboplatin for AUC of 5 on day 1, etoposide 100 mg/M2 on days 1, 2 and 3 with Tecentriq 1200 mg every 3 weeks as well as Cosela on the days of the chemotherapy.  He is status post 7 cycles.   The patient continues to tolerate his treatment fairly well with no concerning adverse effects. I recommended for him to proceed with cycle #8 today as planned. I will see the patient back for follow-up visit in 3 weeks for evaluation before starting cycle #9.  Is scheduled to have CT scan of the chest, abdomen pelvis in few days. For the recurrent pleural effusion, I will consider The patient for repeat ultrasound-guided right thoracentesis on as-needed basis. The patient was advised to call immediately if he has any concerning symptoms in  the interval. The patient voices understanding of current disease status and treatment options and is in agreement with the current care plan.  All questions were answered. The patient knows to call the clinic with any problems, questions or concerns. We can certainly see the  patient much sooner if necessary.  Disclaimer: This note was dictated with voice recognition software. Similar sounding words can inadvertently be transcribed and may not be corrected upon review.

## 2021-05-14 NOTE — Patient Instructions (Signed)
[facility]  Discharge Instructions: Thank you for choosing [facility] to provide your oncology and hematology care.   If you have a lab appointment with the [facility], please go directly to the [facility] and check in at the registration area.   Wear comfortable clothing and clothing appropriate for easy access to any Portacath or PICC line.   We strive to give you quality time with your provider. You may need to reschedule your appointment if you arrive late (15 or more minutes).  Arriving late affects you and other patients whose appointments are after yours.  Also, if you miss three or more appointments without notifying the office, you may be dismissed from the clinic at the provider's discretion.      For prescription refill requests, have your pharmacy contact our office and allow 72 hours for refills to be completed.    Today you received the following chemotherapy and/or immunotherapy agents tecentriq    To help prevent nausea and vomiting after your treatment, we encourage you to take your nausea medication as directed.  BELOW ARE SYMPTOMS THAT SHOULD BE REPORTED IMMEDIATELY: . *FEVER GREATER THAN 100.4 F (38 C) OR HIGHER . *CHILLS OR SWEATING . *NAUSEA AND VOMITING THAT IS NOT CONTROLLED WITH YOUR NAUSEA MEDICATION . *UNUSUAL SHORTNESS OF BREATH . *UNUSUAL BRUISING OR BLEEDING . *URINARY PROBLEMS (pain or burning when urinating, or frequent urination) . *BOWEL PROBLEMS (unusual diarrhea, constipation, pain near the anus) . TENDERNESS IN MOUTH AND THROAT WITH OR WITHOUT PRESENCE OF ULCERS (sore throat, sores in mouth, or a toothache) . UNUSUAL RASH, SWELLING OR PAIN  . UNUSUAL VAGINAL DISCHARGE OR ITCHING   Items with * indicate a potential emergency and should be followed up as soon as possible or go to the Emergency Department if any problems should occur.  Please show the CHEMOTHERAPY ALERT CARD or IMMUNOTHERAPY ALERT  CARD at check-in to the Emergency Department and triage nurse.  Should you have questions after your visit or need to cancel or reschedule your appointment, please contact [facility]  Dept: [phone]  and follow the prompts.  Office hours are 8:00 a.m. to 4:30 p.m. Monday - Friday. Please note that voicemails left after 4:00 p.m. may not be returned until the following business day.  We are closed weekends and major holidays. You have access to a nurse at all times for urgent questions. Please call the main number to the clinic Dept: [phone] and follow the prompts.   For any non-urgent questions, you may also contact your provider using MyChart. We now offer e-Visits for anyone 76 and older to request care online for non-urgent symptoms. For details visit [email or web address].   Also download the MyChart app! Go to the app store, search "MyChart", open the app, select [facility], and log in with your MyChart username and password.  Due to Covid, a mask is required upon entering the hospital/clinic. If you do not have a mask, one will be given to you upon arrival. For doctor visits, patients may have 1 support person aged 73 or older with them. For treatment visits, patients cannot have anyone with them due to current Covid guidelines and our immunocompromised population.

## 2021-05-16 ENCOUNTER — Ambulatory Visit (HOSPITAL_COMMUNITY)
Admission: RE | Admit: 2021-05-16 | Discharge: 2021-05-16 | Disposition: A | Discharge status: Home / Self Care | Payer: Medicare Other | Source: Ambulatory Visit | Attending: Internal Medicine | Admitting: Internal Medicine

## 2021-05-16 ENCOUNTER — Encounter (HOSPITAL_COMMUNITY): Payer: Self-pay

## 2021-05-16 ENCOUNTER — Other Ambulatory Visit: Payer: Self-pay

## 2021-05-16 DIAGNOSIS — C787 Secondary malignant neoplasm of liver and intrahepatic bile duct: Secondary | ICD-10-CM | POA: Diagnosis not present

## 2021-05-16 DIAGNOSIS — E278 Other specified disorders of adrenal gland: Secondary | ICD-10-CM | POA: Diagnosis not present

## 2021-05-16 DIAGNOSIS — C3490 Malignant neoplasm of unspecified part of unspecified bronchus or lung: Secondary | ICD-10-CM | POA: Diagnosis not present

## 2021-05-16 DIAGNOSIS — I2510 Atherosclerotic heart disease of native coronary artery without angina pectoris: Secondary | ICD-10-CM | POA: Diagnosis not present

## 2021-05-16 DIAGNOSIS — J90 Pleural effusion, not elsewhere classified: Secondary | ICD-10-CM | POA: Diagnosis not present

## 2021-05-16 DIAGNOSIS — I708 Atherosclerosis of other arteries: Secondary | ICD-10-CM | POA: Diagnosis not present

## 2021-05-16 DIAGNOSIS — K429 Umbilical hernia without obstruction or gangrene: Secondary | ICD-10-CM | POA: Diagnosis not present

## 2021-05-16 MED ORDER — IOHEXOL 300 MG/ML  SOLN
100.0000 mL | Freq: Once | INTRAMUSCULAR | Status: AC | PRN
  Administered 2021-05-16: 100 mL via INTRAVENOUS

## 2021-05-16 MED ORDER — HEPARIN SOD (PORK) LOCK FLUSH 100 UNIT/ML IV SOLN
500.0000 [IU] | Freq: Once | INTRAVENOUS | Status: AC
  Administered 2021-05-16: 500 [IU] via INTRAVENOUS

## 2021-05-16 MED ORDER — HEPARIN SOD (PORK) LOCK FLUSH 100 UNIT/ML IV SOLN
INTRAVENOUS | Status: AC
  Filled 2021-05-16: qty 5

## 2021-05-16 MED ORDER — SODIUM CHLORIDE (PF) 0.9 % IJ SOLN
INTRAMUSCULAR | Status: AC
  Filled 2021-05-16: qty 50

## 2021-05-21 ENCOUNTER — Telehealth: Payer: Self-pay | Admitting: Internal Medicine

## 2021-05-21 NOTE — Telephone Encounter (Signed)
Scheduled appointment per 06/07 sch msg. Patient is aware.

## 2021-05-22 ENCOUNTER — Telehealth: Payer: Self-pay | Admitting: Medical Oncology

## 2021-05-22 NOTE — Telephone Encounter (Signed)
Immunodeficency test with saliva-   Pt stated he never requested [facility] lab to Request Immunodeficency Plus ( 19 genes) .  I faxed it back with note indicating Adam Wise will not sign for this test.

## 2021-05-26 ENCOUNTER — Other Ambulatory Visit: Payer: Self-pay | Admitting: Primary Care

## 2021-05-26 ENCOUNTER — Other Ambulatory Visit: Payer: Self-pay | Admitting: Physician Assistant

## 2021-05-26 DIAGNOSIS — E119 Type 2 diabetes mellitus without complications: Secondary | ICD-10-CM

## 2021-05-26 DIAGNOSIS — I2692 Saddle embolus of pulmonary artery without acute cor pulmonale: Secondary | ICD-10-CM

## 2021-05-26 NOTE — Progress Notes (Signed)
[facility] OFFICE PROGRESS NOTE  [name], NP [place] [address]  DIAGNOSIS: Relapsed extensive stage (T4, N3, M1c)  small cell lung cancer diagnosed in April 2021 and presented with large right upper lobe/right hilar mass with ipsilateral and contralateral mediastinal and right supraclavicular lymphadenopathy in addition to multiple liver lesios. He has disease progression after the first line of chemotherapy in December 2021.  PRIOR THERAPY: 1) Palliative radiotherapy to the right upper lobe lung mass under the care of Dr. Lisbeth Renshaw. 2) Systemic chemotherapy with carboplatin for AUC of 5 on day 1, etoposide 100 mg/M2 on days 1, 2 and 3 in addition to Imfinzi 1500 mg IV every 3 weeks with chemotherapy then every 4 weeks for maintenance if the patient has no evidence for progression.  He will also receive Cosela 240 mg/m2 on the days of the chemotherapy.  Status post 9 cycles.  Starting from cycle #5 the patient will be on maintenance treatment with immunotherapy with Imfinzi 1500 mg IV every 4 weeks. Last dose of chemotherapy was given on November 13, 2020. This treatment was discontinued secondary to disease progression 3) Systemic chemotherapy with carboplatin for AUC of 5 on day 1, etoposide 100 mg/M2 on days 1, 2 and 3, Tecentriq 1200 mg IV every 3 weeks as well as Cosela 250 mg/M2 on the days of the chemotherapy every 3 weeks.  First dose December 18, 2020.  Status post 8 cycles.  CURRENT THERAPY: Zepzelca 3.2 mgm2 IV every 3 weeks. First dose expected on 06/05/21  INTERVAL HISTORY: Adam Wise 70 y.o. male returns to the clinic today for a follow-up visit accompanied by his wife.  The patient is feeling well today without any concerning complaints. The patient was last seen in the clinic on 05/14/2021, the patient's restaging scan was performed in the interval which unfortunately showed disease progression.  Therefore, the patient was rescheduled for an  office visit today to discuss his scan results and for more detailed discussion about his current condition and recommended treatment options.  Overall, the patient is feeling well today without any concerning complaints.  He denies any fever, chills, night sweats, or unexplained weight loss.  He denies any chest pain, cough, or hemoptysis but reports his baseline dyspnea on exertion with exertion. Sometimes he feels an achy on the right side which he attributes to radiation and/or his history of pleural effusions. This is unchanged.  He denies any nausea, vomiting, diarrhea, or constipation.  He denies any headache or visual changes.  He denies any rashes or skin changes.  He is here today for evaluation to review his scan results.     MEDICAL HISTORY: Past Medical History:  Diagnosis Date   Cancer [facility])    Chickenpox    Chronic knee pain    Chronic low back pain    Essential hypertension    GERD (gastroesophageal reflux disease)    OSA (obstructive sleep apnea)    Testosterone deficiency    Type 2 diabetes mellitus (HCC)     ALLERGIES:  is allergic to bupropion.  MEDICATIONS:  Current Outpatient Medications  Medication Sig Dispense Refill   albuterol (VENTOLIN HFA) 108 (90 Base) MCG/ACT inhaler Inhale 2 puffs into the lungs every 6 (six) hours as needed for wheezing or shortness of breath. 18 g 0   amLODipine (NORVASC) 10 MG tablet TAKE 1 TABLET BY MOUTH EVERY DAY 90 tablet 1   B-D 3CC LUER-LOK SYR 22GX1" 22G X 1"  3 ML MISC USE AS INSTRUCTED FOR TESTOSTERONE INJECTION EVERY 2 WEEKS 10 each 2   Black Pepper-Turmeric (TURMERIC COMPLEX/BLACK PEPPER PO) Take 1 tablet by mouth in the morning and at bedtime.     blood glucose meter kit and supplies KIT Dispense based on patient and insurance preference. Use up to four times daily as directed. 1 each 0   chlorpheniramine-HYDROcodone (TUSSIONEX) 10-8 MG/5ML SUER Take 5 mLs by mouth at bedtime as needed for cough. 140 mL 0   Coenzyme Q10 ([employer]  COQ-10) 200 MG capsule Take 200 mg by mouth every evening.      ELIQUIS 5 MG TABS tablet TAKE 1 TABLET BY MOUTH TWICE A DAY 180 tablet 2   esomeprazole (NEXIUM) 20 MG capsule Take 20 mg by mouth daily.      fluticasone (FLONASE) 50 MCG/ACT nasal spray Place 2 sprays into both nostrils daily as needed for allergies.     gabapentin (NEURONTIN) 300 MG capsule Take 300 mg by mouth 2 (two) times daily.      glipiZIDE (GLUCOTROL) 10 MG tablet TAKE 1 TABLET (10 MG TOTAL) BY MOUTH 2 (TWO) TIMES DAILY BEFORE A MEAL. FOR DIABETES. 180 tablet 1   glucose blood (ONETOUCH ULTRA) test strip USE UP TO 4 TIMES DAILY AS DIRECTED 100 strip 5   Insulin Glargine (BASAGLAR KWIKPEN) 100 UNIT/ML Inject 20 Units into the skin at bedtime. For diabetes. 15 mL 3   Insulin Pen Needle (BD PEN NEEDLE NANO U/F) 32G X 4 MM MISC Use with insulin as prescribed  Dx Code: E11.9 100 each 2   KRILL OIL PO Take 350 mg by mouth daily.     Lancets MISC USE UP TO 3 TIMES DAILY AS DIRECTED 100 each 2   lidocaine-prilocaine (EMLA) cream Apply 1 application topically as needed. 30 g 2   lisinopril (ZESTRIL) 5 MG tablet TAKE 1 TABLET BY MOUTH EVERY DAY 90 tablet 1   Melatonin 10 MG CAPS Take 10 mg by mouth at bedtime.     metFORMIN (GLUCOPHAGE) 1000 MG tablet TAKE 1 TABLET (1,000 MG TOTAL) BY MOUTH 2 (TWO) TIMES DAILY WITH A MEAL. 180 tablet 0   Multiple Vitamin (MULTI-VITAMINS) TABS Take 1 tablet by mouth daily.      OVER THE COUNTER MEDICATION Take by mouth.     oxymetazoline (AFRIN) 0.05 % nasal spray Place 1 spray into both nostrils 2 (two) times daily as needed for congestion.     pioglitazone (ACTOS) 45 MG tablet Take 1 tablet (45 mg total) by mouth daily. For diabetes. 90 tablet 2   pravastatin (PRAVACHOL) 40 MG tablet TAKE 1 TABLET BY MOUTH EVERY DAY IN THE EVENING FOR CHOLESTEROL 90 tablet 1   prochlorperazine (COMPAZINE) 10 MG tablet Take 1 tablet (10 mg total) by mouth every 6 (six) hours as needed for nausea or vomiting. 30  tablet 0   sildenafil (REVATIO) 20 MG tablet Take 3-5 tablets by mouth 1 hour prior to intercourse as needed. 50 tablet 0   testosterone cypionate (DEPOTESTOSTERONE CYPIONATE) 200 MG/ML injection Inject 200 mg into the muscle every 14 (fourteen) days.     No current facility-administered medications for this visit.    SURGICAL HISTORY:  Past Surgical History:  Procedure Laterality Date   COLONOSCOPY WITH PROPOFOL N/A 12/17/2018   Procedure: COLONOSCOPY WITH PROPOFOL;  Surgeon: Virgel Manifold, MD;  Location: [facility] ENDOSCOPY;  Service: Gastroenterology;  Laterality: N/A;   IR IMAGING GUIDED PORT INSERTION  04/17/2020   JOINT REPLACEMENT  Bilateral    REPLACEMENT TOTAL KNEE BILATERAL  2015   TONSILLECTOMY  1960    REVIEW OF SYSTEMS:   Review of Systems  Constitutional: Negative for appetite change, chills, fatigue, fever and unexpected weight change.  HENT:   Negative for mouth sores, nosebleeds, sore throat and trouble swallowing.   Eyes: Negative for eye problems and icterus.  Respiratory:  Positive for dyspnea on exertion. Negative for cough, hemoptysis, and wheezing.   Cardiovascular: Negative for chest pain and leg swelling.  Gastrointestinal: Negative for abdominal pain, constipation, diarrhea, nausea and vomiting.  Genitourinary: Negative for bladder incontinence, difficulty urinating, dysuria, frequency and hematuria.   Musculoskeletal: Negative for back pain, gait problem, neck pain and neck stiffness.  Skin: Negative for itching and rash.  Neurological: Negative for dizziness, extremity weakness, gait problem, headaches, light-headedness and seizures.  Hematological: Negative for adenopathy. Does not bruise/bleed easily.  Psychiatric/Behavioral: Negative for confusion, depression and sleep disturbance. The patient is not nervous/anxious.     PHYSICAL EXAMINATION:  Blood pressure (!) 146/90, pulse (!) 101, temperature 98.5 F (36.9 C), temperature source Oral, resp. rate 19,  weight 294 lb 1.6 oz (133.4 kg), SpO2 96 %.  ECOG PERFORMANCE STATUS: 1  Physical Exam  Constitutional: Oriented to person, place, and time and well-developed, well-nourished, and in no distress.  HENT:  Head: Normocephalic and atraumatic.  Mouth/Throat: Oropharynx is clear and moist. No oropharyngeal exudate.  Eyes: Conjunctivae are normal. Right eye exhibits no discharge. Left eye exhibits no discharge. No scleral icterus.  Neck: Normal range of motion. Neck supple.  Cardiovascular: Normal rate, regular rhythm, normal heart sounds and intact distal pulses.   Pulmonary/Chest: Effort normal. Decreased breath sounds in right llung. No respiratory distress. No wheezes. No rales.  Abdominal: Soft. Bowel sounds are normal. Exhibits no distension and no mass. There is no tenderness.  Musculoskeletal: Normal range of motion. Exhibits no edema.  Lymphadenopathy:    No cervical adenopathy.  Neurological: Alert and oriented to person, place, and time. Exhibits normal muscle tone. Gait normal. Coordination normal.  Skin: Skin is warm and dry. No rash noted. Not diaphoretic. No erythema. No pallor.  Psychiatric: Mood, memory and judgment normal.  Vitals reviewed.  LABORATORY DATA: Lab Results  Component Value Date   WBC 5.2 05/14/2021   HGB 13.5 05/14/2021   HCT 41.6 05/14/2021   MCV 93.1 05/14/2021   PLT 180 05/14/2021      Chemistry      Component Value Date/Time   NA 140 05/14/2021 1333   K 4.5 05/14/2021 1333   CL 103 05/14/2021 1333   CO2 26 05/14/2021 1333   BUN 14 05/14/2021 1333   CREATININE 1.14 05/14/2021 1333      Component Value Date/Time   CALCIUM 9.0 05/14/2021 1333   ALKPHOS 62 05/14/2021 1333   AST 19 05/14/2021 1333   ALT 18 05/14/2021 1333   BILITOT 0.2 (L) 05/14/2021 1333       RADIOGRAPHIC STUDIES:  CT Chest W Contrast  Result Date: 05/16/2021 CLINICAL DATA:  Non-small cell lung cancer restaging EXAM: CT CHEST, ABDOMEN, AND PELVIS WITH CONTRAST  TECHNIQUE: Multidetector CT imaging of the chest, abdomen and pelvis was performed following the standard protocol during bolus administration of intravenous contrast. CONTRAST:  126mL OMNIPAQUE IOHEXOL 300 MG/ML  SOLN COMPARISON:  Multiple exams, including 03/11/2021 FINDINGS: CT CHEST FINDINGS Cardiovascular: Right Port-A-Cath tip: Cavoatrial junction. Mild atherosclerotic calcification of the aortic arch and of the left anterior descending coronary artery. Mediastinum/Nodes: No pathologic adenopathy.  Lungs/Pleura: Stable moderate right pleural effusion with passive atelectasis. Stable right paramediastinal and right perihilar volume loss and consolidative findings probably from prior radiation therapy. No substantial change from 03/11/2021 or specific indicator of significant recurrent mass in this vicinity. Musculoskeletal: Stable asymmetric degenerative left sternoclavicular arthropathy with underlying subcortical sclerosis especially in the medial right clavicle. CT ABDOMEN PELVIS FINDINGS Hepatobiliary: The a hepatic metastatic lesions are increased in size. Index lesion in segment 3 measures 3.2 by 2.6 cm on image 55 series 2, formerly 1.0 by 1.0 cm. Index lesion laterally in the right hepatic lobe measures 4.0 by 2.2 cm on image 59 series 2, previously 1.3 by 1.0 cm. Other lesions are similarly enlarged. Contracted gallbladder.  No biliary dilatation. Pancreas: Unremarkable Spleen: Unremarkable Adrenals/Urinary Tract: Stable small nonspecific right adrenal nodule measuring 1.6 cm in long axis on image 57 series 2. Left adrenal gland unremarkable. Right renal cysts noted. Prostate gland indents the bladder base. Stomach/Bowel: Unremarkable Vascular/Lymphatic: Aortoiliac atherosclerotic vascular disease. Reproductive: The prostate gland measures 5.8 by 4.8 by 5.4 cm (volume = 79 cm^3), compatible with prostatomegaly. Other: No supplemental non-categorized findings. Musculoskeletal: Chronic bilateral pars  defects at L5 without significant subluxation. Thoracic and lumbar spondylosis and degenerative disc disease causing multilevel impingement. Small umbilical hernia contains adipose tissue. IMPRESSION: 1. Enlarging hepatic metastatic lesions. 2. Stable post therapy related findings in the right lung with stable moderate right pleural effusion. 3. Other imaging findings of potential clinical significance: Aortic Atherosclerosis (ICD10-I70.0). Coronary atherosclerosis. Stable left sternoclavicular arthropathy. Stable small nonspecific right adrenal nodule. Prostatomegaly. Chronic pars defects at L5. Multilevel thoracolumbar impingement due to spondylosis and degenerative disc disease. Small umbilical hernia contains adipose tissue. Electronically Signed   By: Van Clines M.D.   On: 05/16/2021 19:48   CT Abdomen Pelvis W Contrast  Result Date: 05/16/2021 CLINICAL DATA:  Non-small cell lung cancer restaging EXAM: CT CHEST, ABDOMEN, AND PELVIS WITH CONTRAST TECHNIQUE: Multidetector CT imaging of the chest, abdomen and pelvis was performed following the standard protocol during bolus administration of intravenous contrast. CONTRAST:  116mL OMNIPAQUE IOHEXOL 300 MG/ML  SOLN COMPARISON:  Multiple exams, including 03/11/2021 FINDINGS: CT CHEST FINDINGS Cardiovascular: Right Port-A-Cath tip: Cavoatrial junction. Mild atherosclerotic calcification of the aortic arch and of the left anterior descending coronary artery. Mediastinum/Nodes: No pathologic adenopathy. Lungs/Pleura: Stable moderate right pleural effusion with passive atelectasis. Stable right paramediastinal and right perihilar volume loss and consolidative findings probably from prior radiation therapy. No substantial change from 03/11/2021 or specific indicator of significant recurrent mass in this vicinity. Musculoskeletal: Stable asymmetric degenerative left sternoclavicular arthropathy with underlying subcortical sclerosis especially in the medial right  clavicle. CT ABDOMEN PELVIS FINDINGS Hepatobiliary: The a hepatic metastatic lesions are increased in size. Index lesion in segment 3 measures 3.2 by 2.6 cm on image 55 series 2, formerly 1.0 by 1.0 cm. Index lesion laterally in the right hepatic lobe measures 4.0 by 2.2 cm on image 59 series 2, previously 1.3 by 1.0 cm. Other lesions are similarly enlarged. Contracted gallbladder.  No biliary dilatation. Pancreas: Unremarkable Spleen: Unremarkable Adrenals/Urinary Tract: Stable small nonspecific right adrenal nodule measuring 1.6 cm in long axis on image 57 series 2. Left adrenal gland unremarkable. Right renal cysts noted. Prostate gland indents the bladder base. Stomach/Bowel: Unremarkable Vascular/Lymphatic: Aortoiliac atherosclerotic vascular disease. Reproductive: The prostate gland measures 5.8 by 4.8 by 5.4 cm (volume = 79 cm^3), compatible with prostatomegaly. Other: No supplemental non-categorized findings. 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 Thoracic and lumbar  spondylosis and degenerative disc disease causing multilevel impingement. Small umbilical hernia contains adipose tissue. IMPRESSION: 1. Enlarging hepatic metastatic lesions. 2. Stable post therapy related findings in the right lung with stable moderate right pleural effusion. 3. Other imaging findings of potential clinical significance: Aortic Atherosclerosis (ICD10-I70.0). Coronary atherosclerosis. Stable left sternoclavicular arthropathy. Stable small nonspecific right adrenal nodule. Prostatomegaly. Chronic pars defects at L5. Multilevel thoracolumbar impingement due to spondylosis and degenerative disc disease. Small umbilical hernia contains adipose tissue. Electronically Signed   By: Van Clines M.D.   On: 05/16/2021 19:48     ASSESSMENT/PLAN:  This is a very pleasant 70 year old Caucasian male recently diagnosed with extensive stage (T4, N3, M1C) small cell lung cancer presented  with large right upper lobe lung mass in addition to mediastinal and right supraclavicular lymphadenopathy and multiple metastatic liver lesions diagnosed in April 2021.  The patient initially underwent systemic chemotherapy with carboplatin for an AUC of 5 on day 1, etoposide 100 mg per metered squared on days 1, 2, and 3 in addition to [facility] for myeloprotection.  He also received immunotherapy with Imfinzi on day 1 of every chemotherapy cycle.  He is status post 9 cycles.  Starting from cycle #5 he was on single agent immunotherapy with Imfinzi IV every 4 weeks.   The patient had evidence of disease progression on his scan from December 2021.  He was then started on systemic chemotherapy with carboplatin for an AUC of 5 on day 1, etoposide 100 mg per metered squared on days 1, 2, and 3 in addition to Tecentriq 1200 mg on day 1, the patient is status post 8 cycles.  Starting from cycle #5, the patient started maintenance immunotherapy with Tecentriq.  The patient recently had a restaging CT scan performed.  Dr. Julien Nordmann personally and independently reviewed his scan and discussed results with the patient today.  The scan unfortunately showed evidence of disease progression.  Dr. Julien Nordmann had a lengthy discussion with the patient today about his current condition and recommended treatment options.  Dr. Julien Nordmann gave the patient the option of starting palliative chemotherapy with zepelca IV every 3 weeks.  The patient is interested in this option today and he is expected to receive his first dose of treatment on 06/05/21. He will be out of town on 8/7-8/23 which is why we will start on 06/05/21.   The adverse side effects of treatment were discussed including but not limited to nausea, vomiting, myelosuppression, liver, and renal dysfunction.   We will see him back for a follow up visit in 2 weeks for evaluation and a one week follow up visit to manage any adverse side effects of treatment.    The patient  was advised to call immediately if he has any concerning symptoms in the interval. The patient voices understanding of current disease status and treatment options and is in agreement with the current care plan. All questions were answered. The patient knows to call the clinic with any problems, questions or concerns. We can certainly see the patient much sooner if necessary         Orders Placed This Encounter  Procedures   CBC with Differential ([facility] Only)    Standing Status:   Standing    Number of Occurrences:   20    Standing Expiration Date:   05/27/2022   CMP ([facility] only)    Standing Status:   Standing    Number of Occurrences:   20  Standing Expiration Date:   05/27/2022      Tobe Sos Nicolina Hirt, PA-C 05/27/21  ADDENDUM: Hematology/Oncology Attending: I had a face-to-face encounter with the patient today.  I reviewed his record, scan and recommended his care plan.  This is a very pleasant 70 years old white male with extensive stage small cell lung cancer that was initially diagnosed in April 2021 status post induction systemic chemotherapy with carboplatin, etoposide, Cosela and Imfinzi followed by maintenance treatment with Imfinzi for 5 more cycles before the patient had evidence for disease progression. He was treated again with systemic chemotherapy with carboplatin and Tecentriq for 4 cycles followed by 4 cycles of maintenance Tecentriq. The patient had repeat CT scan of the chest, abdomen pelvis performed recently.  I personally and independently reviewed the scan and discussed the results with the patient and his wife. Unfortunately his scan showed evidence for disease progression with enlarging hepatic metastatic lesions. I recommended for the patient to discontinue his current treatment with Tecentriq. I discussed with the patient other treatment options including palliative care versus third line treatment with systemic chemotherapy with  Zepzelca (lurbinectedin). The patient is interested in proceeding with systemic chemotherapy with Zepzelca (lurbinectedin).  He will be treated with Zepzelca (lurbinectedin) 3.2 mg/M2 every 3 weeks.  We will continue to monitor his blood count closely during treatment.  We discussed with the patient the adverse effect of this treatment including but not limited to alopecia, myelosuppression, nausea and vomiting, peripheral neuropathy, liver or renal dysfunction. He is expected to start the first cycle of his treatment next week. The patient will come back for follow-up visit in 2 weeks for evaluation and management of any adverse effect of his treatment. The patient was advised to call immediately if he has any other concerning symptoms in the interval. The total time spent in the appointment was 35 minutes. Disclaimer: This note was dictated with voice recognition software. Similar sounding words can inadvertently be transcribed and may be missed upon review. Eilleen Kempf, MD 05/27/21

## 2021-05-27 ENCOUNTER — Inpatient Hospital Stay: Payer: Medicare Other | Attending: Internal Medicine | Admitting: Physician Assistant

## 2021-05-27 ENCOUNTER — Other Ambulatory Visit: Payer: Self-pay | Admitting: Internal Medicine

## 2021-05-27 ENCOUNTER — Other Ambulatory Visit: Payer: Self-pay

## 2021-05-27 VITALS — BP 146/90 | HR 101 | Temp 98.5°F | Resp 19 | Wt 294.1 lb

## 2021-05-27 DIAGNOSIS — Z5112 Encounter for antineoplastic immunotherapy: Secondary | ICD-10-CM | POA: Diagnosis not present

## 2021-05-27 DIAGNOSIS — C787 Secondary malignant neoplasm of liver and intrahepatic bile duct: Secondary | ICD-10-CM | POA: Insufficient documentation

## 2021-05-27 DIAGNOSIS — Z79899 Other long term (current) drug therapy: Secondary | ICD-10-CM | POA: Diagnosis not present

## 2021-05-27 DIAGNOSIS — Z452 Encounter for adjustment and management of vascular access device: Secondary | ICD-10-CM | POA: Diagnosis not present

## 2021-05-27 DIAGNOSIS — J90 Pleural effusion, not elsewhere classified: Secondary | ICD-10-CM | POA: Diagnosis not present

## 2021-05-27 DIAGNOSIS — C3411 Malignant neoplasm of upper lobe, right bronchus or lung: Secondary | ICD-10-CM

## 2021-05-27 NOTE — Patient Instructions (Signed)
Lurbinectedin Injection What is this medication? LURBINECTEDIN (LOOR bin EK te din) is a chemotherapy drug. This medicine isused to treat lung cancer. This medicine may be used for other purposes; ask your health care provider orpharmacist if you have questions. COMMON BRAND NAME(S): ZEPZELCA What should I tell my care team before I take this medication? They need to know if you have any of these conditions: infection (especially a viral infection such as chickenpox, cold sores, or herpes) liver disease low blood counts, like white cells, platelets, or red blood cells an unusual or allergic reaction to lurbinectedin, other medicines, foods, dyes or preservatives pregnant or trying to get pregnant breast-feeding How should I use this medication? This medicine is for infusion into a vein. It is given by a healthcareprofessional in a hospital or clinic setting. Talk to your pediatrician about the use of this medicine in children. Specialcare may be needed. Overdosage: If you think you have taken too much of this medicine contact apoison control center or emergency room at once. NOTE: This medicine is only for you. Do not share this medicine with others. What if I miss a dose? Keep appointments for follow-up doses. It is important not to miss your dose. Call your doctor or health care professional if you are unable to keep anappointment. What may interact with this medication? certain antibiotics like erythromycin or clarithromycin certain antivirals for HIV or hepatitis certain medicines for fungal infections like ketoconazole, itraconazole, or posaconazole certain medicines for seizures like carbamazepine, phenobarbital, phenytoin grapefruit or grapefruit juice St. John's Wort This list may not describe all possible interactions. Give your health care provider a list of all the medicines, herbs, non-prescription drugs, or dietary supplements you use. Also tell them if you smoke, drink  alcohol, or use illegaldrugs. Some items may interact with your medicine. What should I watch for while using this medication? Your condition will be monitored carefully while you are receiving thismedicine. Do not become pregnant while taking this medicine or for 6 months after stopping it. Women should inform their health care professional if they wish to become pregnant or think they might be pregnant. Men should not father a child while taking this medicine and for 4 months after stopping it. There is potential for serious side effects to an unborn child. Talk to your health careprofessional for more information. Do not breast-feed a child while taking this medicine or for 2 weeks afterstopping it. Call your health care professional for advice if you get a fever, chills, or sore throat, or other symptoms of a cold or flu. Do not treat yourself. This medicine decreases your body's ability to fight infections. Try to avoid beingaround people who are sick. Avoid taking medicines that contain aspirin, acetaminophen, ibuprofen, naproxen, or ketoprofen unless instructed by your health care professional.These medicines may hide a fever. Be careful brushing or flossing your teeth or using a toothpick because you may get an infection or bleed more easily. If you have any dental work done, Primary school teacher you are receiving this medicine. What side effects may I notice from receiving this medication? Side effects that you should report to your doctor or health care professionalas soon as possible: allergic reactions like skin rash, itching or hives; swelling of the face, lips, or tongue chest pain nausea, vomiting signs and symptoms of bleeding such as bloody or black, tarry stools; red or dark-brown urine; spitting up blood or brown material that looks like coffee grounds; red spots on the skin; unusual bruising or  bleeding from the eyes, gums, or nose signs and symptoms of infection like fever; chills;  cough; sore throat; pain or trouble passing urine signs and symptoms of liver injury like dark yellow or brown urine; general ill feeling or flu-like symptoms; light-colored stools; loss of appetite; nausea; right upper belly pain; unusually weak or tired; yellowing of the eyes or skin Side effects that usually do not require medical attention (report these toyour doctor or health care professional if they continue or are bothersome): changes in taste constipation diarrhea loss of appetite muscle pain pain, tingling, numbness in the hands or feet signs and symptoms of high blood sugar such as being more thirsty or hungry or having to urinate more than normal. You may also feel very tired or have blurry vision. signs and symptoms of low magnesium like muscle cramps; muscle pain; muscle weakness; tremors; seizures; or fast, irregular heartbeat signs and symptoms of low red blood cells or anemia such as unusually weak or tired; feeling faint or lightheaded; falls; breathing problems stomach pain This list may not describe all possible side effects. Call your doctor for medical advice about side effects. You may report side effects to FDA at1-800-FDA-1088. Where should I keep my medication? This medicine is given in a hospital or clinic and will not be stored at home. NOTE: This sheet is a summary. It may not cover all possible information. If you have questions about this medicine, talk to your doctor, pharmacist, orhealth care provider.  2022 Elsevier/Gold Standard (2019-06-02 13:00:33)

## 2021-05-27 NOTE — Progress Notes (Signed)
DISCONTINUE ON PATHWAY REGIMEN - Small Cell Lung     Cycles 1 through 4, every 21 days:     Atezolizumab      Carboplatin      Etoposide    Cycles 5 and beyond, every 21 days:     Atezolizumab   **Always confirm dose/schedule in your pharmacy ordering system**  REASON: Disease Progression PRIOR TREATMENT: [id number]: Atezolizumab 1,200 mg D1 + Carboplatin AUC=5 D1 + Etoposide 100 mg/m2 D1-3 q21 Days x 4 Cycles, Followed by Atezolizumab 1,200 mg q21 Days Until Progression or Unacceptable Toxicity TREATMENT RESPONSE: Progressive Disease (PD)  START OFF PATHWAY REGIMEN - Small Cell Lung   [id number]:Lurbinectedin 3.2 mg/m2 IV D1 q21 Days:   A cycle is every 21 days:     Lurbinectedin   **Always confirm dose/schedule in your pharmacy ordering system**  Patient Characteristics: Relapsed or Progressive Disease, Third Line and Beyond Therapeutic Status: Relapsed or Progressive Disease Line of Therapy: Third Line and Beyond  Intent of Therapy: Non-Curative / Palliative Intent, Discussed with Patient

## 2021-05-29 NOTE — Progress Notes (Signed)
Pharmacist Chemotherapy Monitoring - Initial Assessment    Anticipated start date: 06/05/21   Regimen:  Are orders appropriate based on the patient's diagnosis, regimen, and cycle? Yes Does the plan date match the patient's scheduled date? Yes Is the sequencing of drugs appropriate? Yes Are the premedications appropriate for the patient's regimen? Yes Prior Authorization for treatment is: Not Started If applicable, is the correct biosimilar selected based on the patient's insurance? not applicable  Organ Function and Labs: Are dose adjustments needed based on the patient's renal function, hepatic function, or hematologic function? No Are appropriate labs ordered prior to the start of patient's treatment? Yes Other organ system assessment, if indicated: N/A The following baseline labs, if indicated, have been ordered: N/A  Dose Assessment: Are the drug doses appropriate? Yes Are the following correct: Drug concentrations Yes IV fluid compatible with drug Yes Administration routes Yes Timing of therapy Yes If applicable, does the patient have documented access for treatment and/or plans for port-a-cath placement? yes If applicable, have lifetime cumulative doses been properly documented and assessed? yes Lifetime Dose Tracking   Carboplatin: 4,989.5 mg = 0.01 % of the maximum lifetime dose of 999,999,999 mg    Toxicity Monitoring/Prevention: The patient has the following take home antiemetics prescribed: Prochlorperazine The patient has the following take home medications prescribed: N/A Medication allergies and previous infusion related reactions, if applicable, have been reviewed and addressed. Yes The patient's current medication list has been assessed for drug-drug interactions with their chemotherapy regimen. no significant drug-drug interactions were identified on review.  Order Review: Are the treatment plan orders signed? Yes Is the patient scheduled to see a provider  prior to their treatment? Yes  I verify that I have reviewed each item in the above checklist and answered each question accordingly.   Kennith Center, Pharm.D., CPP 05/29/2021@9 :26 AM

## 2021-06-03 ENCOUNTER — Telehealth: Payer: Self-pay | Admitting: Physician Assistant

## 2021-06-03 NOTE — Telephone Encounter (Signed)
Scheduled appts per 6/20 sch msg. Will have updated calendar printed for pt at next visit.

## 2021-06-03 NOTE — Progress Notes (Deleted)
[facility] OFFICE PROGRESS NOTE  [name], NP [place] [address]  DIAGNOSIS: ***  PRIOR THERAPY:  CURRENT THERAPY:  INTERVAL HISTORY: Adam Wise 70 y.o. male returns for *** regular *** visit for followup of ***   MEDICAL HISTORY: Past Medical History:  Diagnosis Date   Cancer (Adam Wise)    Chickenpox    Chronic knee pain    Chronic low back pain    Essential hypertension    GERD (gastroesophageal reflux disease)    OSA (obstructive sleep apnea)    Testosterone deficiency    Type 2 diabetes mellitus (HCC)     ALLERGIES:  is allergic to bupropion.  MEDICATIONS:  Current Outpatient Medications  Medication Sig Dispense Refill   albuterol (VENTOLIN HFA) 108 (90 Base) MCG/ACT inhaler Inhale 2 puffs into the lungs every 6 (six) hours as needed for wheezing or shortness of breath. 18 g 0   amLODipine (NORVASC) 10 MG tablet TAKE 1 TABLET BY MOUTH EVERY DAY 90 tablet 1   B-D 3CC LUER-LOK SYR 22GX1" 22G X 1" 3 ML MISC USE AS INSTRUCTED FOR TESTOSTERONE INJECTION EVERY 2 WEEKS 10 each 2   Black Pepper-Turmeric (TURMERIC COMPLEX/BLACK PEPPER PO) Take 1 tablet by mouth in the morning and at bedtime.     blood glucose meter kit and supplies KIT Dispense based on patient and insurance preference. Use up to four times daily as directed. 1 each 0   chlorpheniramine-HYDROcodone (TUSSIONEX) 10-8 MG/5ML SUER Take 5 mLs by mouth at bedtime as needed for cough. 140 mL 0   Coenzyme Q10 ([employer] COQ-10) 200 MG capsule Take 200 mg by mouth every evening.      ELIQUIS 5 MG TABS tablet TAKE 1 TABLET BY MOUTH TWICE A DAY 180 tablet 2   esomeprazole (NEXIUM) 20 MG capsule Take 20 mg by mouth daily.      fluticasone (FLONASE) 50 MCG/ACT nasal spray Place 2 sprays into both nostrils daily as needed for allergies.     gabapentin (NEURONTIN) 300 MG capsule Take 300 mg by mouth 2 (two) times daily.      glipiZIDE (GLUCOTROL) 10 MG tablet TAKE 1 TABLET (10 MG TOTAL) BY  MOUTH 2 (TWO) TIMES DAILY BEFORE A MEAL. FOR DIABETES. 180 tablet 1   glucose blood (ONETOUCH ULTRA) test strip USE UP TO 4 TIMES DAILY AS DIRECTED 100 strip 5   Insulin Glargine (BASAGLAR KWIKPEN) 100 UNIT/ML Inject 20 Units into the skin at bedtime. For diabetes. 15 mL 3   Insulin Pen Needle (BD PEN NEEDLE NANO U/F) 32G X 4 MM MISC Use with insulin as prescribed  Dx Code: E11.9 100 each 2   KRILL OIL PO Take 350 mg by mouth daily.     Lancets MISC USE UP TO 3 TIMES DAILY AS DIRECTED 100 each 2   lidocaine-prilocaine (EMLA) cream Apply 1 application topically as needed. 30 g 2   lisinopril (ZESTRIL) 5 MG tablet TAKE 1 TABLET BY MOUTH EVERY DAY 90 tablet 1   Melatonin 10 MG CAPS Take 10 mg by mouth at bedtime.     metFORMIN (GLUCOPHAGE) 1000 MG tablet TAKE 1 TABLET (1,000 MG TOTAL) BY MOUTH 2 (TWO) TIMES DAILY WITH A MEAL. 180 tablet 0   Multiple Vitamin (MULTI-VITAMINS) TABS Take 1 tablet by mouth daily.      OVER THE COUNTER MEDICATION Take by mouth.     oxymetazoline (AFRIN) 0.05 % nasal spray Place 1 spray into both nostrils  2 (two) times daily as needed for congestion.     pioglitazone (ACTOS) 45 MG tablet Take 1 tablet (45 mg total) by mouth daily. For diabetes. 90 tablet 2   pravastatin (PRAVACHOL) 40 MG tablet TAKE 1 TABLET BY MOUTH EVERY DAY IN THE EVENING FOR CHOLESTEROL 90 tablet 1   prochlorperazine (COMPAZINE) 10 MG tablet Take 1 tablet (10 mg total) by mouth every 6 (six) hours as needed for nausea or vomiting. 30 tablet 0   sildenafil (REVATIO) 20 MG tablet Take 3-5 tablets by mouth 1 hour prior to intercourse as needed. 50 tablet 0   testosterone cypionate (DEPOTESTOSTERONE CYPIONATE) 200 MG/ML injection Inject 200 mg into the muscle every 14 (fourteen) days.     No current facility-administered medications for this visit.    SURGICAL HISTORY:  Past Surgical History:  Procedure Laterality Date   COLONOSCOPY WITH PROPOFOL N/A 12/17/2018   Procedure: COLONOSCOPY WITH PROPOFOL;   Surgeon: Virgel Manifold, MD;  Location: [facility] ENDOSCOPY;  Service: Gastroenterology;  Laterality: N/A;   IR IMAGING GUIDED PORT INSERTION  04/17/2020   JOINT REPLACEMENT Bilateral    REPLACEMENT TOTAL KNEE BILATERAL  2015   TONSILLECTOMY  1960    REVIEW OF SYSTEMS:   Review of Systems  Constitutional: Negative for appetite change, chills, fatigue, fever and unexpected weight change.  HENT:   Negative for mouth sores, nosebleeds, sore throat and trouble swallowing.   Eyes: Negative for eye problems and icterus.  Respiratory: Negative for cough, hemoptysis, shortness of breath and wheezing.   Cardiovascular: Negative for chest pain and leg swelling.  Gastrointestinal: Negative for abdominal pain, constipation, diarrhea, nausea and vomiting.  Genitourinary: Negative for bladder incontinence, difficulty urinating, dysuria, frequency and hematuria.   Musculoskeletal: Negative for back pain, gait problem, neck pain and neck stiffness.  Skin: Negative for itching and rash.  Neurological: Negative for dizziness, extremity weakness, gait problem, headaches, light-headedness and seizures.  Hematological: Negative for adenopathy. Does not bruise/bleed easily.  Psychiatric/Behavioral: Negative for confusion, depression and sleep disturbance. The patient is not nervous/anxious.     PHYSICAL EXAMINATION:  There were no vitals taken for this visit.  ECOG PERFORMANCE STATUS: {CHL ONC ECOG [id number]  Physical Exam  Constitutional: Oriented to person, place, and time and well-developed, well-nourished, and in no distress. No distress.  HENT:  Head: Normocephalic and atraumatic.  Mouth/Throat: Oropharynx is clear and moist. No oropharyngeal exudate.  Eyes: Conjunctivae are normal. Right eye exhibits no discharge. Left eye exhibits no discharge. No scleral icterus.  Neck: Normal range of motion. Neck supple.  Cardiovascular: Normal rate, regular rhythm, normal heart sounds and intact distal  pulses.   Pulmonary/Chest: Effort normal and breath sounds normal. No respiratory distress. No wheezes. No rales.  Abdominal: Soft. Bowel sounds are normal. Exhibits no distension and no mass. There is no tenderness.  Musculoskeletal: Normal range of motion. Exhibits no edema.  Lymphadenopathy:    No cervical adenopathy.  Neurological: Alert and oriented to person, place, and time. Exhibits normal muscle tone. Gait normal. Coordination normal.  Skin: Skin is warm and dry. No rash noted. Not diaphoretic. No erythema. No pallor.  Psychiatric: Mood, memory and judgment normal.  Vitals reviewed.  LABORATORY DATA: Lab Results  Component Value Date   WBC 5.2 05/14/2021   HGB 13.5 05/14/2021   HCT 41.6 05/14/2021   MCV 93.1 05/14/2021   PLT 180 05/14/2021      Chemistry      Component Value Date/Time   NA 140 05/14/2021 1333  K 4.5 05/14/2021 1333   CL 103 05/14/2021 1333   CO2 26 05/14/2021 1333   BUN 14 05/14/2021 1333   CREATININE 1.14 05/14/2021 1333      Component Value Date/Time   CALCIUM 9.0 05/14/2021 1333   ALKPHOS 62 05/14/2021 1333   AST 19 05/14/2021 1333   ALT 18 05/14/2021 1333   BILITOT 0.2 (L) 05/14/2021 1333       RADIOGRAPHIC STUDIES:  CT Chest W Contrast  Result Date: 05/16/2021 CLINICAL DATA:  Non-small cell lung cancer restaging EXAM: CT CHEST, ABDOMEN, AND PELVIS WITH CONTRAST TECHNIQUE: Multidetector CT imaging of the chest, abdomen and pelvis was performed following the standard protocol during bolus administration of intravenous contrast. CONTRAST:  161mL OMNIPAQUE IOHEXOL 300 MG/ML  SOLN COMPARISON:  Multiple exams, including 03/11/2021 FINDINGS: CT CHEST FINDINGS Cardiovascular: Right Port-A-Cath tip: Cavoatrial junction. Mild atherosclerotic calcification of the aortic arch and of the left anterior descending coronary artery. Mediastinum/Nodes: No pathologic adenopathy. Lungs/Pleura: Stable moderate right pleural effusion with passive atelectasis.  Stable right paramediastinal and right perihilar volume loss and consolidative findings probably from prior radiation therapy. No substantial change from 03/11/2021 or specific indicator of significant recurrent mass in this vicinity. Musculoskeletal: Stable asymmetric degenerative left sternoclavicular arthropathy with underlying subcortical sclerosis especially in the medial right clavicle. CT ABDOMEN PELVIS FINDINGS Hepatobiliary: The a hepatic metastatic lesions are increased in size. Index lesion in segment 3 measures 3.2 by 2.6 cm on image 55 series 2, formerly 1.0 by 1.0 cm. Index lesion laterally in the right hepatic lobe measures 4.0 by 2.2 cm on image 59 series 2, previously 1.3 by 1.0 cm. Other lesions are similarly enlarged. Contracted gallbladder.  No biliary dilatation. Pancreas: Unremarkable Spleen: Unremarkable Adrenals/Urinary Tract: Stable small nonspecific right adrenal nodule measuring 1.6 cm in long axis on image 57 series 2. Left adrenal gland unremarkable. Right renal cysts noted. Prostate gland indents the bladder base. Stomach/Bowel: Unremarkable Vascular/Lymphatic: Aortoiliac atherosclerotic vascular disease. Reproductive: The prostate gland measures 5.8 by 4.8 by 5.4 cm (volume = 79 cm^3), compatible with prostatomegaly. Other: No supplemental non-categorized findings. 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 Thoracic and lumbar spondylosis and degenerative disc disease causing multilevel impingement. Small umbilical hernia contains adipose tissue. IMPRESSION: 1. Enlarging hepatic metastatic lesions. 2. Stable post therapy related findings in the right lung with stable moderate right pleural effusion. 3. Other imaging findings of potential clinical significance: Aortic Atherosclerosis (ICD10-I70.0). Coronary atherosclerosis. Stable left sternoclavicular arthropathy. Stable small nonspecific right adrenal nodule. Prostatomegaly. Chronic pars  defects at L5. Multilevel thoracolumbar impingement due to spondylosis and degenerative disc disease. Small umbilical hernia contains adipose tissue. Electronically Signed   By: Van Clines M.D.   On: 05/16/2021 19:48   CT Abdomen Pelvis W Contrast  Result Date: 05/16/2021 CLINICAL DATA:  Non-small cell lung cancer restaging EXAM: CT CHEST, ABDOMEN, AND PELVIS WITH CONTRAST TECHNIQUE: Multidetector CT imaging of the chest, abdomen and pelvis was performed following the standard protocol during bolus administration of intravenous contrast. CONTRAST:  161mL OMNIPAQUE IOHEXOL 300 MG/ML  SOLN COMPARISON:  Multiple exams, including 03/11/2021 FINDINGS: CT CHEST FINDINGS Cardiovascular: Right Port-A-Cath tip: Cavoatrial junction. Mild atherosclerotic calcification of the aortic arch and of the left anterior descending coronary artery. Mediastinum/Nodes: No pathologic adenopathy. Lungs/Pleura: Stable moderate right pleural effusion with passive atelectasis. Stable right paramediastinal and right perihilar volume loss and consolidative findings probably from prior radiation therapy. No substantial change from 03/11/2021 or specific indicator of significant recurrent mass in this  vicinity. Musculoskeletal: Stable asymmetric degenerative left sternoclavicular arthropathy with underlying subcortical sclerosis especially in the medial right clavicle. CT ABDOMEN PELVIS FINDINGS Hepatobiliary: The a hepatic metastatic lesions are increased in size. Index lesion in segment 3 measures 3.2 by 2.6 cm on image 55 series 2, formerly 1.0 by 1.0 cm. Index lesion laterally in the right hepatic lobe measures 4.0 by 2.2 cm on image 59 series 2, previously 1.3 by 1.0 cm. Other lesions are similarly enlarged. Contracted gallbladder.  No biliary dilatation. Pancreas: Unremarkable Spleen: Unremarkable Adrenals/Urinary Tract: Stable small nonspecific right adrenal nodule measuring 1.6 cm in long axis on image 57 series 2. Left adrenal  gland unremarkable. Right renal cysts noted. Prostate gland indents the bladder base. Stomach/Bowel: Unremarkable Vascular/Lymphatic: Aortoiliac atherosclerotic vascular disease. Reproductive: The prostate gland measures 5.8 by 4.8 by 5.4 cm (volume = 79 cm^3), compatible with prostatomegaly. Other: No supplemental non-categorized findings. 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 Thoracic and lumbar spondylosis and degenerative disc disease causing multilevel impingement. Small umbilical hernia contains adipose tissue. IMPRESSION: 1. Enlarging hepatic metastatic lesions. 2. Stable post therapy related findings in the right lung with stable moderate right pleural effusion. 3. Other imaging findings of potential clinical significance: Aortic Atherosclerosis (ICD10-I70.0). Coronary atherosclerosis. Stable left sternoclavicular arthropathy. Stable small nonspecific right adrenal nodule. Prostatomegaly. Chronic pars defects at L5. Multilevel thoracolumbar impingement due to spondylosis and degenerative disc disease. Small umbilical hernia contains adipose tissue. Electronically Signed   By: Van Clines M.D.   On: 05/16/2021 19:48     ASSESSMENT/PLAN:  No problem-specific Assessment & Plan notes found for this encounter.   No orders of the defined types were placed in this encounter.    I spent {CHL ONC TIME VISIT - WUJWJ:[id number]} counseling the patient face to face. The total time spent in the appointment was {CHL ONC TIME VISIT - OZHYQ:[id number]}.  Echo [place] L Viaan Knippenberg, PA-C 06/03/21

## 2021-06-05 ENCOUNTER — Inpatient Hospital Stay: Payer: Medicare Other

## 2021-06-05 ENCOUNTER — Inpatient Hospital Stay: Payer: Medicare Other | Admitting: Physician Assistant

## 2021-06-05 ENCOUNTER — Other Ambulatory Visit: Payer: Medicare Other

## 2021-06-05 ENCOUNTER — Other Ambulatory Visit: Payer: Self-pay

## 2021-06-05 VITALS — BP 146/107 | HR 92 | Temp 98.6°F | Resp 18 | Ht 69.0 in | Wt 293.0 lb

## 2021-06-05 DIAGNOSIS — C3411 Malignant neoplasm of upper lobe, right bronchus or lung: Secondary | ICD-10-CM

## 2021-06-05 DIAGNOSIS — J90 Pleural effusion, not elsewhere classified: Secondary | ICD-10-CM | POA: Diagnosis not present

## 2021-06-05 DIAGNOSIS — Z452 Encounter for adjustment and management of vascular access device: Secondary | ICD-10-CM | POA: Diagnosis not present

## 2021-06-05 DIAGNOSIS — C787 Secondary malignant neoplasm of liver and intrahepatic bile duct: Secondary | ICD-10-CM | POA: Diagnosis not present

## 2021-06-05 DIAGNOSIS — Z95828 Presence of other vascular implants and grafts: Secondary | ICD-10-CM

## 2021-06-05 DIAGNOSIS — Z79899 Other long term (current) drug therapy: Secondary | ICD-10-CM | POA: Diagnosis not present

## 2021-06-05 DIAGNOSIS — R5383 Other fatigue: Secondary | ICD-10-CM

## 2021-06-05 DIAGNOSIS — Z5112 Encounter for antineoplastic immunotherapy: Secondary | ICD-10-CM | POA: Diagnosis not present

## 2021-06-05 LAB — CMP (CANCER CENTER ONLY)
ALT: 19 U/L (ref 0–44)
AST: 20 U/L (ref 15–41)
Albumin: 3.8 g/dL (ref 3.5–5.0)
Alkaline Phosphatase: 66 U/L (ref 38–126)
Anion gap: 9 (ref 5–15)
BUN: 16 mg/dL (ref 8–23)
CO2: 26 mmol/L (ref 22–32)
Calcium: 9 mg/dL (ref 8.9–10.3)
Chloride: 104 mmol/L (ref 98–111)
Creatinine: 1.37 mg/dL — ABNORMAL HIGH (ref 0.61–1.24)
GFR, Estimated: 55 mL/min — ABNORMAL LOW (ref >60)
Glucose, Bld: 201 mg/dL — ABNORMAL HIGH (ref 70–99)
Potassium: 4.2 mmol/L (ref 3.5–5.1)
Sodium: 139 mmol/L (ref 135–145)
Total Bilirubin: 0.4 mg/dL (ref 0.3–1.2)
Total Protein: 6.3 g/dL — ABNORMAL LOW (ref 6.5–8.1)

## 2021-06-05 LAB — CBC WITH DIFFERENTIAL (CANCER CENTER ONLY)
Abs Immature Granulocytes: 0.03 10*3/uL (ref 0.00–0.07)
Basophils Absolute: 0 10*3/uL (ref 0.0–0.1)
Basophils Relative: 1 %
Eosinophils Absolute: 0.1 10*3/uL (ref 0.0–0.5)
Eosinophils Relative: 2 %
HCT: 42.5 % (ref 39.0–52.0)
Hemoglobin: 14.2 g/dL (ref 13.0–17.0)
Immature Granulocytes: 1 %
Lymphocytes Relative: 18 %
Lymphs Abs: 0.9 10*3/uL (ref 0.7–4.0)
MCH: 30 pg (ref 26.0–34.0)
MCHC: 33.4 g/dL (ref 30.0–36.0)
MCV: 89.7 fL (ref 80.0–100.0)
Monocytes Absolute: 0.5 10*3/uL (ref 0.1–1.0)
Monocytes Relative: 10 %
Neutro Abs: 3.3 10*3/uL (ref 1.7–7.7)
Neutrophils Relative %: 68 %
Platelet Count: 165 10*3/uL (ref 150–400)
RBC: 4.74 MIL/uL (ref 4.22–5.81)
RDW: 13.2 % (ref 11.5–15.5)
WBC Count: 4.8 10*3/uL (ref 4.0–10.5)
nRBC: 0 % (ref 0.0–0.2)

## 2021-06-05 LAB — TSH: TSH: 0.54 u[IU]/mL (ref 0.320–4.118)

## 2021-06-05 MED ORDER — SODIUM CHLORIDE 0.9% FLUSH
10.0000 mL | Freq: Once | INTRAVENOUS | Status: AC
  Administered 2021-06-05: 10 mL
  Filled 2021-06-05: qty 10

## 2021-06-05 MED ORDER — PALONOSETRON HCL INJECTION 0.25 MG/5ML
0.2500 mg | Freq: Once | INTRAVENOUS | Status: AC
  Administered 2021-06-05: 0.25 mg via INTRAVENOUS

## 2021-06-05 MED ORDER — SODIUM CHLORIDE 0.9 % IV SOLN
10.0000 mg | Freq: Once | INTRAVENOUS | Status: AC
  Administered 2021-06-05: 10 mg via INTRAVENOUS
  Filled 2021-06-05: qty 10

## 2021-06-05 MED ORDER — SODIUM CHLORIDE 0.9 % IV SOLN
8.0000 mg | Freq: Once | INTRAVENOUS | Status: AC
  Administered 2021-06-05: 8 mg via INTRAVENOUS
  Filled 2021-06-05: qty 16

## 2021-06-05 MED ORDER — PALONOSETRON HCL INJECTION 0.25 MG/5ML
INTRAVENOUS | Status: AC
  Filled 2021-06-05: qty 5

## 2021-06-05 MED ORDER — SODIUM CHLORIDE 0.9 % IV SOLN
Freq: Once | INTRAVENOUS | Status: AC
  Filled 2021-06-05: qty 250

## 2021-06-05 NOTE — Patient Instructions (Addendum)
[facility]  Discharge Instructions: Thank you for choosing [facility] to provide your oncology and hematology care.   If you have a lab appointment with the [facility], please go directly to the [facility] and check in at the registration area.   Wear comfortable clothing and clothing appropriate for easy access to any Portacath or PICC line.   We strive to give you quality time with your provider. You may need to reschedule your appointment if you arrive late (15 or more minutes).  Arriving late affects you and other patients whose appointments are after yours.  Also, if you miss three or more appointments without notifying the office, you may be dismissed from the clinic at the provider's discretion.      For prescription refill requests, have your pharmacy contact our office and allow 72 hours for refills to be completed.    Today you received the following chemotherapy and/or immunotherapy agents   lurbinectedin    To help prevent nausea and vomiting after your treatment, we encourage you to take your nausea medication as directed.  BELOW ARE SYMPTOMS THAT SHOULD BE REPORTED IMMEDIATELY: *FEVER GREATER THAN 100.4 F (38 C) OR HIGHER *CHILLS OR SWEATING *NAUSEA AND VOMITING THAT IS NOT CONTROLLED WITH YOUR NAUSEA MEDICATION *UNUSUAL SHORTNESS OF BREATH *UNUSUAL BRUISING OR BLEEDING *URINARY PROBLEMS (pain or burning when urinating, or frequent urination) *BOWEL PROBLEMS (unusual diarrhea, constipation, pain near the anus) TENDERNESS IN MOUTH AND THROAT WITH OR WITHOUT PRESENCE OF ULCERS (sore throat, sores in mouth, or a toothache) UNUSUAL RASH, SWELLING OR PAIN  UNUSUAL VAGINAL DISCHARGE OR ITCHING   Items with * indicate a potential emergency and should be followed up as soon as possible or go to the Emergency Department if any problems should occur.  Please show the CHEMOTHERAPY ALERT CARD or IMMUNOTHERAPY ALERT CARD at check-in  to the Emergency Department and triage nurse.  Should you have questions after your visit or need to cancel or reschedule your appointment, please contact [facility]  Dept: [phone]  and follow the prompts.  Office hours are 8:00 a.m. to 4:30 p.m. Monday - Friday. Please note that voicemails left after 4:00 p.m. may not be returned until the following business day.  We are closed weekends and major holidays. You have access to a nurse at all times for urgent questions. Please call the main number to the clinic Dept: [phone] and follow the prompts.   For any non-urgent questions, you may also contact your provider using MyChart. We now offer e-Visits for anyone 44 and older to request care online for non-urgent symptoms. For details visit [email or web address].   Also download the MyChart app! Go to the app store, search "MyChart", open the app, select [facility], and log in with your MyChart username and password.  Due to Covid, a mask is required upon entering the hospital/clinic. If you do not have a mask, one will be given to you upon arrival. For doctor visits, patients may have 1 support person aged 67 or older with them. For treatment visits, patients cannot have anyone with them due to current Covid guidelines and our immunocompromised population.    Lurbinectedin Injection What is this medication? LURBINECTEDIN (LOOR bin EK te din) is a chemotherapy drug. This medicine isused to treat lung cancer. This medicine may be used for other purposes; ask your health care provider orpharmacist if you have questions. COMMON BRAND NAME(S): ZEPZELCA What should I  tell my care team before I take this medication? They need to know if you have any of these conditions: infection (especially a viral infection such as chickenpox, cold sores, or herpes) liver disease low blood counts, like white cells, platelets, or red blood cells an unusual or allergic reaction  to lurbinectedin, other medicines, foods, dyes or preservatives pregnant or trying to get pregnant breast-feeding How should I use this medication? This medicine is for infusion into a vein. It is given by a healthcareprofessional in a hospital or clinic setting. Talk to your pediatrician about the use of this medicine in children. Specialcare may be needed. Overdosage: If you think you have taken too much of this medicine contact apoison control center or emergency room at once. NOTE: This medicine is only for you. Do not share this medicine with others. What if I miss a dose? Keep appointments for follow-up doses. It is important not to miss your dose. Call your doctor or health care professional if you are unable to keep anappointment. What may interact with this medication? certain antibiotics like erythromycin or clarithromycin certain antivirals for HIV or hepatitis certain medicines for fungal infections like ketoconazole, itraconazole, or posaconazole certain medicines for seizures like carbamazepine, phenobarbital, phenytoin grapefruit or grapefruit juice St. John's Wort This list may not describe all possible interactions. Give your health care provider a list of all the medicines, herbs, non-prescription drugs, or dietary supplements you use. Also tell them if you smoke, drink alcohol, or use illegaldrugs. Some items may interact with your medicine. What should I watch for while using this medication? Your condition will be monitored carefully while you are receiving thismedicine. Do not become pregnant while taking this medicine or for 6 months after stopping it. Women should inform their health care professional if they wish to become pregnant or think they might be pregnant. Men should not father a child while taking this medicine and for 4 months after stopping it. There is potential for serious side effects to an unborn child. Talk to your health careprofessional for more  information. Do not breast-feed a child while taking this medicine or for 2 weeks afterstopping it. Call your health care professional for advice if you get a fever, chills, or sore throat, or other symptoms of a cold or flu. Do not treat yourself. This medicine decreases your body's ability to fight infections. Try to avoid beingaround people who are sick. Avoid taking medicines that contain aspirin, acetaminophen, ibuprofen, naproxen, or ketoprofen unless instructed by your health care professional.These medicines may hide a fever. Be careful brushing or flossing your teeth or using a toothpick because you may get an infection or bleed more easily. If you have any dental work done, Primary school teacher you are receiving this medicine. What side effects may I notice from receiving this medication? Side effects that you should report to your doctor or health care professionalas soon as possible: allergic reactions like skin rash, itching or hives; swelling of the face, lips, or tongue chest pain nausea, vomiting signs and symptoms of bleeding such as bloody or black, tarry stools; red or dark-brown urine; spitting up blood or brown material that looks like coffee grounds; red spots on the skin; unusual bruising or bleeding from the eyes, gums, or nose signs and symptoms of infection like fever; chills; cough; sore throat; pain or trouble passing urine signs and symptoms of liver injury like dark yellow or brown urine; general ill feeling or flu-like symptoms; light-colored stools; loss of appetite; nausea;  right upper belly pain; unusually weak or tired; yellowing of the eyes or skin Side effects that usually do not require medical attention (report these toyour doctor or health care professional if they continue or are bothersome): changes in taste constipation diarrhea loss of appetite muscle pain pain, tingling, numbness in the hands or feet signs and symptoms of high blood sugar such as being  more thirsty or hungry or having to urinate more than normal. You may also feel very tired or have blurry vision. signs and symptoms of low magnesium like muscle cramps; muscle pain; muscle weakness; tremors; seizures; or fast, irregular heartbeat signs and symptoms of low red blood cells or anemia such as unusually weak or tired; feeling faint or lightheaded; falls; breathing problems stomach pain This list may not describe all possible side effects. Call your doctor for medical advice about side effects. You may report side effects to FDA at1-800-FDA-1088. Where should I keep my medication? This medicine is given in a hospital or clinic and will not be stored at home. NOTE: This sheet is a summary. It may not cover all possible information. If you have questions about this medicine, talk to your doctor, pharmacist, orhealth care provider.  2022 [employer] (2019-06-02 13:00:33)

## 2021-06-06 NOTE — Progress Notes (Signed)
[facility] OFFICE PROGRESS NOTE  [name], NP [place] [address]  DIAGNOSIS: Relapsed extensive stage (T4, N3, M1c)  small cell lung cancer diagnosed in April 2021 and presented with large right upper lobe/right hilar mass with ipsilateral and contralateral mediastinal and right supraclavicular lymphadenopathy in addition to multiple liver lesios. He has disease progression after the first line of chemotherapy in December 2021.  PRIOR THERAPY: 1) Palliative radiotherapy to the right upper lobe lung mass under the care of Dr. Lisbeth Renshaw. 2) Systemic chemotherapy with carboplatin for AUC of 5 on day 1, etoposide 100 mg/M2 on days 1, 2 and 3 in addition to Imfinzi 1500 mg IV every 3 weeks with chemotherapy then every 4 weeks for maintenance if the patient has no evidence for progression.  He will also receive Cosela 240 mg/m2 on the days of the chemotherapy.  Status post 9 cycles.  Starting from cycle #5 the patient will be on maintenance treatment with immunotherapy with Imfinzi 1500 mg IV every 4 weeks. Last dose of chemotherapy was given on November 13, 2020. This treatment was discontinued secondary to disease progression 3) Systemic chemotherapy with carboplatin for AUC of 5 on day 1, etoposide 100 mg/M2 on days 1, 2 and 3, Tecentriq 1200 mg IV every 3 weeks as well as Cosela 250 mg/M2 on the days of the chemotherapy every 3 weeks.  First dose December 18, 2020.  Status post 8 cycles.  CURRENT THERAPY: Zepzelca 3.2 mgm2 IV every 3 weeks. First dose expected on 06/05/21. Status post 1 cycle.   INTERVAL HISTORY: Adam Wise 70 y.o. male returns to the clinic today for a follow-up visit accompanied by his wife. The patient is feeling well overall today without any concerning complaints besides worsening shortness of breath. The patient was last seen on 05/27/2021.  At that time, the patient's restaging scan unfortunately showed evidence of disease progression.   Therefore, his treatment was discontinued and changed to systemic chemotherapy with Zepzelca.   Overall, the patient tolerated his first cycle of treatment well.  He denies any fever, chills, night sweats, or unexplained weight loss. He reports fatigue especially following the first two days of treatment. After recovering from that, his energy levels have been stable. He reports a worsened dry cough in the past two weeks. He is requesting a refill of his tussinex. He denies any chest pain or hemoptysis. His shortness of breath has progressively worsened over the past two weeks and is exacerbated upon lying flat. He sometimes feels achy on the right side of his chest which he attributes to his radiation and/or history of pleural effusions.  He denies any nausea, vomiting, diarrhea, or constipation.  He denies any headache or visual changes.  He denies any new rashes or skin changes. He reports baseline intermittent numbness and tingling in his fingertips which fluctuates with changes of position mostly during sleep. The patient is here today for evaluation and a 1 week follow-up visit to manage any adverse side effects of treatment. The patient's wife has questions about any supplements that may be beneficial to the patient and notes the patient is already taking multiple including tumeric, krill oil, and a multivitamin. They are also wondering about recommendations if the patient should continue to pursue taking Cosela as a representative has reached out to them regarding the medication.    MEDICAL HISTORY: Past Medical History:  Diagnosis Date   Cancer [facility])    Chickenpox  Chronic knee pain    Chronic low back pain    Essential hypertension    GERD (gastroesophageal reflux disease)    OSA (obstructive sleep apnea)    Testosterone deficiency    Type 2 diabetes mellitus (HCC)     ALLERGIES:  is allergic to bupropion.  MEDICATIONS:  Current Outpatient Medications  Medication Sig Dispense  Refill   albuterol (VENTOLIN HFA) 108 (90 Base) MCG/ACT inhaler Inhale 2 puffs into the lungs every 6 (six) hours as needed for wheezing or shortness of breath. 18 g 0   amLODipine (NORVASC) 10 MG tablet TAKE 1 TABLET BY MOUTH EVERY DAY 90 tablet 1   B-D 3CC LUER-LOK SYR 22GX1" 22G X 1" 3 ML MISC USE AS INSTRUCTED FOR TESTOSTERONE INJECTION EVERY 2 WEEKS 10 each 2   Black Pepper-Turmeric (TURMERIC COMPLEX/BLACK PEPPER PO) Take 1 tablet by mouth in the morning and at bedtime.     blood glucose meter kit and supplies KIT Dispense based on patient and insurance preference. Use up to four times daily as directed. 1 each 0   chlorpheniramine-HYDROcodone (TUSSIONEX) 10-8 MG/5ML SUER Take 5 mLs by mouth at bedtime as needed for cough. 140 mL 0   Coenzyme Q10 ([employer] COQ-10) 200 MG capsule Take 200 mg by mouth every evening.      ELIQUIS 5 MG TABS tablet TAKE 1 TABLET BY MOUTH TWICE A DAY 180 tablet 2   esomeprazole (NEXIUM) 20 MG capsule Take 20 mg by mouth daily.      fluticasone (FLONASE) 50 MCG/ACT nasal spray Place 2 sprays into both nostrils daily as needed for allergies.     gabapentin (NEURONTIN) 300 MG capsule Take 300 mg by mouth 2 (two) times daily.      glipiZIDE (GLUCOTROL) 10 MG tablet TAKE 1 TABLET (10 MG TOTAL) BY MOUTH 2 (TWO) TIMES DAILY BEFORE A MEAL. FOR DIABETES. 180 tablet 1   glucose blood (ONETOUCH ULTRA) test strip USE UP TO 4 TIMES DAILY AS DIRECTED 100 strip 5   Insulin Glargine (BASAGLAR KWIKPEN) 100 UNIT/ML Inject 20 Units into the skin at bedtime. For diabetes. 15 mL 3   Insulin Pen Needle (BD PEN NEEDLE NANO U/F) 32G X 4 MM MISC Use with insulin as prescribed  Dx Code: E11.9 100 each 2   KRILL OIL PO Take 350 mg by mouth daily.     Lancets MISC USE UP TO 3 TIMES DAILY AS DIRECTED 100 each 2   lidocaine-prilocaine (EMLA) cream Apply 1 application topically as needed. 30 g 2   lisinopril (ZESTRIL) 5 MG tablet TAKE 1 TABLET BY MOUTH EVERY DAY 90 tablet 1   Melatonin 10 MG CAPS  Take 10 mg by mouth at bedtime.     metFORMIN (GLUCOPHAGE) 1000 MG tablet TAKE 1 TABLET (1,000 MG TOTAL) BY MOUTH 2 (TWO) TIMES DAILY WITH A MEAL. 180 tablet 0   Multiple Vitamin (MULTI-VITAMINS) TABS Take 1 tablet by mouth daily.      OVER THE COUNTER MEDICATION Take by mouth.     oxymetazoline (AFRIN) 0.05 % nasal spray Place 1 spray into both nostrils 2 (two) times daily as needed for congestion.     pioglitazone (ACTOS) 45 MG tablet Take 1 tablet (45 mg total) by mouth daily. For diabetes. 90 tablet 2   pravastatin (PRAVACHOL) 40 MG tablet TAKE 1 TABLET BY MOUTH EVERY DAY IN THE EVENING FOR CHOLESTEROL 90 tablet 1   prochlorperazine (COMPAZINE) 10 MG tablet Take 1 tablet (10 mg total) by  mouth every 6 (six) hours as needed for nausea or vomiting. 30 tablet 0   sildenafil (REVATIO) 20 MG tablet Take 3-5 tablets by mouth 1 hour prior to intercourse as needed. 50 tablet 0   testosterone cypionate (DEPOTESTOSTERONE CYPIONATE) 200 MG/ML injection Inject 200 mg into the muscle every 14 (fourteen) days.     No current facility-administered medications for this visit.    SURGICAL HISTORY:  Past Surgical History:  Procedure Laterality Date   COLONOSCOPY WITH PROPOFOL N/A 12/17/2018   Procedure: COLONOSCOPY WITH PROPOFOL;  Surgeon: Virgel Manifold, MD;  Location: [facility] ENDOSCOPY;  Service: Gastroenterology;  Laterality: N/A;   IR IMAGING GUIDED PORT INSERTION  04/17/2020   JOINT REPLACEMENT Bilateral    REPLACEMENT TOTAL KNEE BILATERAL  2015   TONSILLECTOMY  1960    REVIEW OF SYSTEMS:   Review of Systems  Constitutional: Positive for fatigue. Negative for appetite change, chills, fever and unexpected weight change. HENT: Negative for mouth sores, nosebleeds, sore throat and trouble swallowing.   Eyes: Negative for eye problems and icterus. Respiratory: Positive for dyspnea on exertion and increased cough. Negative for cough, hemoptysis, and wheezing.   Cardiovascular: Negative for chest pain  and leg swelling. Gastrointestinal: Negative for abdominal pain, constipation, diarrhea, nausea and vomiting. Genitourinary: Negative for bladder incontinence, difficulty urinating, dysuria, frequency and hematuria.   Musculoskeletal: Negative for back pain, gait problem, neck pain and neck stiffness. Skin: Negative for itching and rash. Neurological: Negative for dizziness, extremity weakness, gait problem, headaches, light-headedness and seizures. Hematological: Negative for adenopathy. Does not bruise/bleed easily. Psychiatric/Behavioral: Negative for confusion, depression and sleep disturbance. The patient is not nervous/anxious.       PHYSICAL EXAMINATION:  Blood pressure (!) 149/91, pulse (!) 101, temperature 98.5 F (36.9 C), temperature source Oral, resp. rate 19, height 5' 9" (1.753 m), weight 290 lb 14.4 oz (132 kg), SpO2 98 %.  ECOG PERFORMANCE STATUS: 1  Physical Exam  Constitutional: Oriented to person, place, and time and well-developed, well-nourished, and in no distress.  HENT:  Head: Normocephalic and atraumatic.  Mouth/Throat: Oropharynx is clear and moist. No oropharyngeal exudate.  Eyes: Conjunctivae are normal. Right eye exhibits no discharge. Left eye exhibits no discharge. No scleral icterus.  Neck: Normal range of motion. Neck supple.  Cardiovascular: Normal rate, regular rhythm, normal heart sounds and intact distal pulses.   Pulmonary/Chest: Decreased breath sounds over the right lower lobe. Effort normal.  No respiratory distress. No wheezes. No rales.  Abdominal: Soft. Bowel sounds are normal. Exhibits no distension and no mass. There is no tenderness.  Musculoskeletal: Normal range of motion. Exhibits no edema.  Lymphadenopathy:    No cervical adenopathy.  Neurological: Alert and oriented to person, place, and time. Exhibits normal muscle tone. Gait normal. Coordination normal.  Skin: Skin is warm and dry. No rash noted. Not diaphoretic. No erythema. No  pallor.  Psychiatric: Mood, memory and judgment normal.  Vitals reviewed.  LABORATORY DATA: Lab Results  Component Value Date   WBC 4.0 06/12/2021   HGB 13.9 06/12/2021   HCT 41.5 06/12/2021   MCV 90.2 06/12/2021   PLT 121 (L) 06/12/2021      Chemistry      Component Value Date/Time   NA 139 06/12/2021 1132   K 4.6 06/12/2021 1132   CL 105 06/12/2021 1132   CO2 24 06/12/2021 1132   BUN 14 06/12/2021 1132   CREATININE 1.26 (H) 06/12/2021 1132      Component Value Date/Time   CALCIUM  8.7 (L) 06/12/2021 1132   ALKPHOS 68 06/12/2021 1132   AST 28 06/12/2021 1132   ALT 56 (H) 06/12/2021 1132   BILITOT 0.3 06/12/2021 1132       RADIOGRAPHIC STUDIES:  CT Chest W Contrast  Result Date: 05/16/2021 CLINICAL DATA:  Non-small cell lung cancer restaging EXAM: CT CHEST, ABDOMEN, AND PELVIS WITH CONTRAST TECHNIQUE: Multidetector CT imaging of the chest, abdomen and pelvis was performed following the standard protocol during bolus administration of intravenous contrast. CONTRAST:  179m OMNIPAQUE IOHEXOL 300 MG/ML  SOLN COMPARISON:  Multiple exams, including 03/11/2021 FINDINGS: CT CHEST FINDINGS Cardiovascular: Right Port-A-Cath tip: Cavoatrial junction. Mild atherosclerotic calcification of the aortic arch and of the left anterior descending coronary artery. Mediastinum/Nodes: No pathologic adenopathy. Lungs/Pleura: Stable moderate right pleural effusion with passive atelectasis. Stable right paramediastinal and right perihilar volume loss and consolidative findings probably from prior radiation therapy. No substantial change from 03/11/2021 or specific indicator of significant recurrent mass in this vicinity. Musculoskeletal: Stable asymmetric degenerative left sternoclavicular arthropathy with underlying subcortical sclerosis especially in the medial right clavicle. CT ABDOMEN PELVIS FINDINGS Hepatobiliary: The a hepatic metastatic lesions are increased in size. Index lesion in segment 3  measures 3.2 by 2.6 cm on image 55 series 2, formerly 1.0 by 1.0 cm. Index lesion laterally in the right hepatic lobe measures 4.0 by 2.2 cm on image 59 series 2, previously 1.3 by 1.0 cm. Other lesions are similarly enlarged. Contracted gallbladder.  No biliary dilatation. Pancreas: Unremarkable Spleen: Unremarkable Adrenals/Urinary Tract: Stable small nonspecific right adrenal nodule measuring 1.6 cm in long axis on image 57 series 2. Left adrenal gland unremarkable. Right renal cysts noted. Prostate gland indents the bladder base. Stomach/Bowel: Unremarkable Vascular/Lymphatic: Aortoiliac atherosclerotic vascular disease. Reproductive: The prostate gland measures 5.8 by 4.8 by 5.4 cm (volume = 79 cm^3), compatible with prostatomegaly. Other: No supplemental non-categorized findings. 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 Thoracic and lumbar spondylosis and degenerative disc disease causing multilevel impingement. Small umbilical hernia contains adipose tissue. IMPRESSION: 1. Enlarging hepatic metastatic lesions. 2. Stable post therapy related findings in the right lung with stable moderate right pleural effusion. 3. Other imaging findings of potential clinical significance: Aortic Atherosclerosis (ICD10-I70.0). Coronary atherosclerosis. Stable left sternoclavicular arthropathy. Stable small nonspecific right adrenal nodule. Prostatomegaly. Chronic pars defects at L5. Multilevel thoracolumbar impingement due to spondylosis and degenerative disc disease. Small umbilical hernia contains adipose tissue. Electronically Signed   By: WVan ClinesM.D.   On: 05/16/2021 19:48   CT Abdomen Pelvis W Contrast  Result Date: 05/16/2021 CLINICAL DATA:  Non-small cell lung cancer restaging EXAM: CT CHEST, ABDOMEN, AND PELVIS WITH CONTRAST TECHNIQUE: Multidetector CT imaging of the chest, abdomen and pelvis was performed following the standard protocol during bolus  administration of intravenous contrast. CONTRAST:  1071mOMNIPAQUE IOHEXOL 300 MG/ML  SOLN COMPARISON:  Multiple exams, including 03/11/2021 FINDINGS: CT CHEST FINDINGS Cardiovascular: Right Port-A-Cath tip: Cavoatrial junction. Mild atherosclerotic calcification of the aortic arch and of the left anterior descending coronary artery. Mediastinum/Nodes: No pathologic adenopathy. Lungs/Pleura: Stable moderate right pleural effusion with passive atelectasis. Stable right paramediastinal and right perihilar volume loss and consolidative findings probably from prior radiation therapy. No substantial change from 03/11/2021 or specific indicator of significant recurrent mass in this vicinity. Musculoskeletal: Stable asymmetric degenerative left sternoclavicular arthropathy with underlying subcortical sclerosis especially in the medial right clavicle. CT ABDOMEN PELVIS FINDINGS Hepatobiliary: The a hepatic metastatic lesions are increased in size. Index lesion in segment 3 measures  3.2 by 2.6 cm on image 55 series 2, formerly 1.0 by 1.0 cm. Index lesion laterally in the right hepatic lobe measures 4.0 by 2.2 cm on image 59 series 2, previously 1.3 by 1.0 cm. Other lesions are similarly enlarged. Contracted gallbladder.  No biliary dilatation. Pancreas: Unremarkable Spleen: Unremarkable Adrenals/Urinary Tract: Stable small nonspecific right adrenal nodule measuring 1.6 cm in long axis on image 57 series 2. Left adrenal gland unremarkable. Right renal cysts noted. Prostate gland indents the bladder base. Stomach/Bowel: Unremarkable Vascular/Lymphatic: Aortoiliac atherosclerotic vascular disease. Reproductive: The prostate gland measures 5.8 by 4.8 by 5.4 cm (volume = 79 cm^3), compatible with prostatomegaly. Other: No supplemental non-categorized findings. 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 Thoracic and lumbar spondylosis and degenerative disc disease causing multilevel  impingement. Small umbilical hernia contains adipose tissue. IMPRESSION: 1. Enlarging hepatic metastatic lesions. 2. Stable post therapy related findings in the right lung with stable moderate right pleural effusion. 3. Other imaging findings of potential clinical significance: Aortic Atherosclerosis (ICD10-I70.0). Coronary atherosclerosis. Stable left sternoclavicular arthropathy. Stable small nonspecific right adrenal nodule. Prostatomegaly. Chronic pars defects at L5. Multilevel thoracolumbar impingement due to spondylosis and degenerative disc disease. Small umbilical hernia contains adipose tissue. Electronically Signed   By: Van Clines M.D.   On: 05/16/2021 19:48     ASSESSMENT/PLAN:  This is a very pleasant 70 year old Caucasian male recently diagnosed with extensive stage (T4, N3, M1C) small cell lung cancer presented with large right upper lobe lung mass in addition to mediastinal and right supraclavicular lymphadenopathy and multiple metastatic liver lesions diagnosed in April 2021.   The patient initially underwent systemic chemotherapy with carboplatin for an AUC of 5 on day 1, etoposide 100 mg per metered squared on days 1, 2, and 3 in addition to [facility] for myeloprotection.  He also received immunotherapy with Imfinzi on day 1 of every chemotherapy cycle.  He is status post 9 cycles.  Starting from cycle #5 he was on single agent immunotherapy with Imfinzi IV every 4 weeks.   The patient had evidence of disease progression on his scan from December 2021.   He was then started on systemic chemotherapy with carboplatin for an AUC of 5 on day 1, etoposide 100 mg per metered squared on days 1, 2, and 3 in addition to Tecentriq 1200 mg on day 1, the patient is status post 8 cycles.  Starting from cycle #5, the patient started maintenance immunotherapy with Tecentriq.  This was discontinued due to evidence of disease progression.  The patient is currently undergoing palliative systemic  chemotherapy with Zepzelca IV every 3 weeks.  He is status post his first cycle and he tolerated it well except for fatigue a few days after treatment.    Labs were reviewed.  Recommend that he continue on the same treatment for now. I reviewed his symptoms with Dr. Julien Nordmann. We will arrange for a repeat CXR today to reassess the pleural effusion. If significant, we will arrange for a therapeutic thoracentesis. We can also see if he has any infection on CXR although I have a low suspicion as the patient is well appearing today, no hypoxia, or fevers/chills.   We will see him back for follow-up visit in 2 weeks for evaluation before starting cycle #2.  Advised there is not enough research on drug interactions for his current chemo and supplements. Discussed our recommendation is to avoid additional supplements.   I refilled his tussinex today.   Also discussed  we would no recommend pursuing Cosela at this time with his current treatment. We will continue to monitor his labs closely on routine weekly labs.   The patient was advised to call immediately if he has any concerning symptoms in the interval. The patient voices understanding of current disease status and treatment options and is in agreement with the current care plan. All questions were answered. The patient knows to call the clinic with any problems, questions or concerns. We can certainly see the patient much sooner if necessary               Orders Placed This Encounter  Procedures   DG Chest 2 View    Standing Status:   Future    Number of Occurrences:   1    Standing Expiration Date:   06/12/2022    Order Specific Question:   Reason for Exam (SYMPTOM  OR DIAGNOSIS REQUIRED)    Answer:   Lung cancer, hx pleural effusion, right sided. Assess fluid amount and rule out other causes of worsening shortness  of breath such as pneumonia.    Order Specific Question:   Preferred imaging location?    Answer:   [facility]     The total time spent in the appointment was 30-39 minutes minutes  Khaila Velarde L Obrien Huskins, PA-C 06/12/21

## 2021-06-12 ENCOUNTER — Other Ambulatory Visit: Payer: Self-pay

## 2021-06-12 ENCOUNTER — Inpatient Hospital Stay (HOSPITAL_BASED_OUTPATIENT_CLINIC_OR_DEPARTMENT_OTHER): Payer: Medicare Other | Admitting: Physician Assistant

## 2021-06-12 ENCOUNTER — Ambulatory Visit (HOSPITAL_COMMUNITY)
Admission: RE | Admit: 2021-06-12 | Discharge: 2021-06-12 | Disposition: A | Discharge status: Home / Self Care | Payer: Medicare Other | Source: Ambulatory Visit | Attending: Physician Assistant | Admitting: Physician Assistant

## 2021-06-12 ENCOUNTER — Telehealth: Payer: Self-pay | Admitting: Physician Assistant

## 2021-06-12 ENCOUNTER — Inpatient Hospital Stay: Payer: Medicare Other

## 2021-06-12 VITALS — BP 149/91 | HR 101 | Temp 98.5°F | Resp 19 | Ht 69.0 in | Wt 290.9 lb

## 2021-06-12 DIAGNOSIS — C3411 Malignant neoplasm of upper lobe, right bronchus or lung: Secondary | ICD-10-CM

## 2021-06-12 DIAGNOSIS — J90 Pleural effusion, not elsewhere classified: Secondary | ICD-10-CM | POA: Diagnosis not present

## 2021-06-12 DIAGNOSIS — R059 Cough, unspecified: Secondary | ICD-10-CM

## 2021-06-12 DIAGNOSIS — C787 Secondary malignant neoplasm of liver and intrahepatic bile duct: Secondary | ICD-10-CM | POA: Diagnosis not present

## 2021-06-12 DIAGNOSIS — Z452 Encounter for adjustment and management of vascular access device: Secondary | ICD-10-CM | POA: Diagnosis not present

## 2021-06-12 DIAGNOSIS — Z95828 Presence of other vascular implants and grafts: Secondary | ICD-10-CM

## 2021-06-12 DIAGNOSIS — Z5112 Encounter for antineoplastic immunotherapy: Secondary | ICD-10-CM | POA: Diagnosis not present

## 2021-06-12 DIAGNOSIS — Z79899 Other long term (current) drug therapy: Secondary | ICD-10-CM | POA: Diagnosis not present

## 2021-06-12 LAB — CBC WITH DIFFERENTIAL (CANCER CENTER ONLY)
Abs Immature Granulocytes: 0.02 10*3/uL (ref 0.00–0.07)
Basophils Absolute: 0 10*3/uL (ref 0.0–0.1)
Basophils Relative: 1 %
Eosinophils Absolute: 0.1 10*3/uL (ref 0.0–0.5)
Eosinophils Relative: 3 %
HCT: 41.5 % (ref 39.0–52.0)
Hemoglobin: 13.9 g/dL (ref 13.0–17.0)
Immature Granulocytes: 1 %
Lymphocytes Relative: 16 %
Lymphs Abs: 0.7 10*3/uL (ref 0.7–4.0)
MCH: 30.2 pg (ref 26.0–34.0)
MCHC: 33.5 g/dL (ref 30.0–36.0)
MCV: 90.2 fL (ref 80.0–100.0)
Monocytes Absolute: 0.1 10*3/uL (ref 0.1–1.0)
Monocytes Relative: 3 %
Neutro Abs: 3.1 10*3/uL (ref 1.7–7.7)
Neutrophils Relative %: 76 %
Platelet Count: 121 10*3/uL — ABNORMAL LOW (ref 150–400)
RBC: 4.6 MIL/uL (ref 4.22–5.81)
RDW: 12.8 % (ref 11.5–15.5)
WBC Count: 4 10*3/uL (ref 4.0–10.5)
nRBC: 0 % (ref 0.0–0.2)

## 2021-06-12 LAB — CMP (CANCER CENTER ONLY)
ALT: 56 U/L — ABNORMAL HIGH (ref 0–44)
AST: 28 U/L (ref 15–41)
Albumin: 3.6 g/dL (ref 3.5–5.0)
Alkaline Phosphatase: 68 U/L (ref 38–126)
Anion gap: 10 (ref 5–15)
BUN: 14 mg/dL (ref 8–23)
CO2: 24 mmol/L (ref 22–32)
Calcium: 8.7 mg/dL — ABNORMAL LOW (ref 8.9–10.3)
Chloride: 105 mmol/L (ref 98–111)
Creatinine: 1.26 mg/dL — ABNORMAL HIGH (ref 0.61–1.24)
GFR, Estimated: >60 mL/min (ref >60)
Glucose, Bld: 176 mg/dL — ABNORMAL HIGH (ref 70–99)
Potassium: 4.6 mmol/L (ref 3.5–5.1)
Sodium: 139 mmol/L (ref 135–145)
Total Bilirubin: 0.3 mg/dL (ref 0.3–1.2)
Total Protein: 6.5 g/dL (ref 6.5–8.1)

## 2021-06-12 MED ORDER — HYDROCOD POLST-CPM POLST ER 10-8 MG/5ML PO SUER: 5.0000 mL | Freq: Every evening | ORAL | 0 refills | Status: DC | PRN

## 2021-06-12 MED ORDER — SODIUM CHLORIDE 0.9% FLUSH
10.0000 mL | Freq: Once | INTRAVENOUS | Status: AC
Start: 2021-06-12 — End: 2021-06-12
  Administered 2021-06-12: 10 mL
  Filled 2021-06-12: qty 10

## 2021-06-12 MED ORDER — HEPARIN SOD (PORK) LOCK FLUSH 100 UNIT/ML IV SOLN
500.0000 [IU] | Freq: Once | INTRAVENOUS | Status: AC
Start: 2021-06-12 — End: 2021-06-12
  Administered 2021-06-12: 500 [IU]
  Filled 2021-06-12: qty 5

## 2021-06-12 NOTE — Telephone Encounter (Signed)
I called the patient but was unable to reach him. I was calling to let him know that the pleural effusion has improved and there is likely just a minimal effusion. I will not order a thoracentesis at this time. If he has questions about this, I left our call back number.

## 2021-06-13 ENCOUNTER — Encounter: Payer: Self-pay | Admitting: Internal Medicine

## 2021-06-14 ENCOUNTER — Other Ambulatory Visit: Payer: Self-pay | Admitting: Family Medicine

## 2021-06-14 DIAGNOSIS — E119 Type 2 diabetes mellitus without complications: Secondary | ICD-10-CM

## 2021-06-19 ENCOUNTER — Other Ambulatory Visit: Payer: Self-pay

## 2021-06-19 ENCOUNTER — Inpatient Hospital Stay: Payer: Medicare Other | Attending: Internal Medicine

## 2021-06-19 DIAGNOSIS — J90 Pleural effusion, not elsewhere classified: Secondary | ICD-10-CM | POA: Diagnosis not present

## 2021-06-19 DIAGNOSIS — C3411 Malignant neoplasm of upper lobe, right bronchus or lung: Secondary | ICD-10-CM | POA: Diagnosis not present

## 2021-06-19 DIAGNOSIS — Z5111 Encounter for antineoplastic chemotherapy: Secondary | ICD-10-CM | POA: Diagnosis not present

## 2021-06-19 DIAGNOSIS — Z95828 Presence of other vascular implants and grafts: Secondary | ICD-10-CM

## 2021-06-19 DIAGNOSIS — Z452 Encounter for adjustment and management of vascular access device: Secondary | ICD-10-CM | POA: Diagnosis not present

## 2021-06-19 DIAGNOSIS — Z79899 Other long term (current) drug therapy: Secondary | ICD-10-CM | POA: Diagnosis not present

## 2021-06-19 LAB — CMP (CANCER CENTER ONLY)
ALT: 30 U/L (ref 0–44)
AST: 27 U/L (ref 15–41)
Albumin: 3.9 g/dL (ref 3.5–5.0)
Alkaline Phosphatase: 70 U/L (ref 38–126)
Anion gap: 8 (ref 5–15)
BUN: 11 mg/dL (ref 8–23)
CO2: 27 mmol/L (ref 22–32)
Calcium: 9 mg/dL (ref 8.9–10.3)
Chloride: 106 mmol/L (ref 98–111)
Creatinine: 1.27 mg/dL — ABNORMAL HIGH (ref 0.61–1.24)
GFR, Estimated: >60 mL/min (ref >60)
Glucose, Bld: 138 mg/dL — ABNORMAL HIGH (ref 70–99)
Potassium: 4.6 mmol/L (ref 3.5–5.1)
Sodium: 141 mmol/L (ref 135–145)
Total Bilirubin: 0.3 mg/dL (ref 0.3–1.2)
Total Protein: 6.6 g/dL (ref 6.5–8.1)

## 2021-06-19 LAB — CBC WITH DIFFERENTIAL (CANCER CENTER ONLY)
Abs Immature Granulocytes: 0 10*3/uL (ref 0.00–0.07)
Basophils Absolute: 0 10*3/uL (ref 0.0–0.1)
Basophils Relative: 1 %
Eosinophils Absolute: 0 10*3/uL (ref 0.0–0.5)
Eosinophils Relative: 2 %
HCT: 42.5 % (ref 39.0–52.0)
Hemoglobin: 14.1 g/dL (ref 13.0–17.0)
Immature Granulocytes: 0 %
Lymphocytes Relative: 29 %
Lymphs Abs: 0.6 10*3/uL — ABNORMAL LOW (ref 0.7–4.0)
MCH: 29.8 pg (ref 26.0–34.0)
MCHC: 33.2 g/dL (ref 30.0–36.0)
MCV: 89.9 fL (ref 80.0–100.0)
Monocytes Absolute: 0.4 10*3/uL (ref 0.1–1.0)
Monocytes Relative: 19 %
Neutro Abs: 1 10*3/uL — ABNORMAL LOW (ref 1.7–7.7)
Neutrophils Relative %: 49 %
Platelet Count: 179 10*3/uL (ref 150–400)
RBC: 4.73 MIL/uL (ref 4.22–5.81)
RDW: 13.3 % (ref 11.5–15.5)
WBC Count: 2.1 10*3/uL — ABNORMAL LOW (ref 4.0–10.5)
nRBC: 0 % (ref 0.0–0.2)

## 2021-06-19 MED ORDER — SODIUM CHLORIDE 0.9% FLUSH
10.0000 mL | Freq: Once | INTRAVENOUS | Status: AC
  Administered 2021-06-19: 10 mL
  Filled 2021-06-19: qty 10

## 2021-06-19 MED ORDER — HEPARIN SOD (PORK) LOCK FLUSH 100 UNIT/ML IV SOLN
500.0000 [IU] | Freq: Once | INTRAVENOUS | Status: AC
  Administered 2021-06-19: 500 [IU]
  Filled 2021-06-19: qty 5

## 2021-06-24 DIAGNOSIS — Z20822 Contact with and (suspected) exposure to covid-19: Secondary | ICD-10-CM | POA: Diagnosis not present

## 2021-06-26 ENCOUNTER — Inpatient Hospital Stay (HOSPITAL_BASED_OUTPATIENT_CLINIC_OR_DEPARTMENT_OTHER): Payer: Medicare Other | Admitting: Internal Medicine

## 2021-06-26 ENCOUNTER — Other Ambulatory Visit: Payer: Self-pay

## 2021-06-26 ENCOUNTER — Other Ambulatory Visit: Payer: Medicare Other

## 2021-06-26 ENCOUNTER — Inpatient Hospital Stay: Payer: Medicare Other

## 2021-06-26 ENCOUNTER — Encounter: Payer: Self-pay | Admitting: Internal Medicine

## 2021-06-26 VITALS — BP 140/78 | HR 102 | Temp 97.0°F | Resp 20 | Ht 69.0 in | Wt 290.9 lb

## 2021-06-26 VITALS — HR 95

## 2021-06-26 DIAGNOSIS — R5382 Chronic fatigue, unspecified: Secondary | ICD-10-CM

## 2021-06-26 DIAGNOSIS — I10 Essential (primary) hypertension: Secondary | ICD-10-CM | POA: Diagnosis not present

## 2021-06-26 DIAGNOSIS — Z5111 Encounter for antineoplastic chemotherapy: Secondary | ICD-10-CM

## 2021-06-26 DIAGNOSIS — C3411 Malignant neoplasm of upper lobe, right bronchus or lung: Secondary | ICD-10-CM

## 2021-06-26 DIAGNOSIS — J90 Pleural effusion, not elsewhere classified: Secondary | ICD-10-CM | POA: Diagnosis not present

## 2021-06-26 DIAGNOSIS — Z452 Encounter for adjustment and management of vascular access device: Secondary | ICD-10-CM | POA: Diagnosis not present

## 2021-06-26 DIAGNOSIS — Z79899 Other long term (current) drug therapy: Secondary | ICD-10-CM | POA: Diagnosis not present

## 2021-06-26 DIAGNOSIS — Z95828 Presence of other vascular implants and grafts: Secondary | ICD-10-CM

## 2021-06-26 LAB — CBC WITH DIFFERENTIAL (CANCER CENTER ONLY)
Abs Immature Granulocytes: 0.05 10*3/uL (ref 0.00–0.07)
Basophils Absolute: 0 10*3/uL (ref 0.0–0.1)
Basophils Relative: 1 %
Eosinophils Absolute: 0.1 10*3/uL (ref 0.0–0.5)
Eosinophils Relative: 1 %
HCT: 42.7 % (ref 39.0–52.0)
Hemoglobin: 14.1 g/dL (ref 13.0–17.0)
Immature Granulocytes: 1 %
Lymphocytes Relative: 20 %
Lymphs Abs: 0.9 10*3/uL (ref 0.7–4.0)
MCH: 29.4 pg (ref 26.0–34.0)
MCHC: 33 g/dL (ref 30.0–36.0)
MCV: 89 fL (ref 80.0–100.0)
Monocytes Absolute: 0.7 10*3/uL (ref 0.1–1.0)
Monocytes Relative: 16 %
Neutro Abs: 2.7 10*3/uL (ref 1.7–7.7)
Neutrophils Relative %: 61 %
Platelet Count: 217 10*3/uL (ref 150–400)
RBC: 4.8 MIL/uL (ref 4.22–5.81)
RDW: 13.5 % (ref 11.5–15.5)
WBC Count: 4.4 10*3/uL (ref 4.0–10.5)
nRBC: 0 % (ref 0.0–0.2)

## 2021-06-26 LAB — CMP (CANCER CENTER ONLY)
ALT: 26 U/L (ref 0–44)
AST: 21 U/L (ref 15–41)
Albumin: 3.6 g/dL (ref 3.5–5.0)
Alkaline Phosphatase: 74 U/L (ref 38–126)
Anion gap: 10 (ref 5–15)
BUN: 11 mg/dL (ref 8–23)
CO2: 26 mmol/L (ref 22–32)
Calcium: 9.3 mg/dL (ref 8.9–10.3)
Chloride: 104 mmol/L (ref 98–111)
Creatinine: 1.27 mg/dL — ABNORMAL HIGH (ref 0.61–1.24)
GFR, Estimated: >60 mL/min (ref >60)
Glucose, Bld: 130 mg/dL — ABNORMAL HIGH (ref 70–99)
Potassium: 4.5 mmol/L (ref 3.5–5.1)
Sodium: 140 mmol/L (ref 135–145)
Total Bilirubin: 0.3 mg/dL (ref 0.3–1.2)
Total Protein: 6.5 g/dL (ref 6.5–8.1)

## 2021-06-26 LAB — TSH: TSH: 0.5 u[IU]/mL (ref 0.320–4.118)

## 2021-06-26 MED ORDER — SODIUM CHLORIDE 0.9 % IV SOLN
8.0000 mg | Freq: Once | INTRAVENOUS | Status: AC
  Administered 2021-06-26: 8 mg via INTRAVENOUS
  Filled 2021-06-26: qty 16

## 2021-06-26 MED ORDER — PALONOSETRON HCL INJECTION 0.25 MG/5ML
INTRAVENOUS | Status: AC
  Filled 2021-06-26: qty 5

## 2021-06-26 MED ORDER — DEXAMETHASONE SODIUM PHOSPHATE 100 MG/10ML IJ SOLN
10.0000 mg | Freq: Once | INTRAMUSCULAR | Status: AC
  Administered 2021-06-26: 10 mg via INTRAVENOUS
  Filled 2021-06-26: qty 10

## 2021-06-26 MED ORDER — SODIUM CHLORIDE 0.9% FLUSH
10.0000 mL | Freq: Once | INTRAVENOUS | Status: AC
  Administered 2021-06-26: 10 mL
  Filled 2021-06-26: qty 10

## 2021-06-26 MED ORDER — SODIUM CHLORIDE 0.9% FLUSH
10.0000 mL | INTRAVENOUS | Status: DC | PRN
  Administered 2021-06-26: 10 mL
  Filled 2021-06-26: qty 10

## 2021-06-26 MED ORDER — SODIUM CHLORIDE 0.9 % IV SOLN
Freq: Once | INTRAVENOUS | Status: AC
Start: 2021-06-26 — End: 2021-06-26
  Filled 2021-06-26: qty 250

## 2021-06-26 MED ORDER — PALONOSETRON HCL INJECTION 0.25 MG/5ML
0.2500 mg | Freq: Once | INTRAVENOUS | Status: AC
  Administered 2021-06-26: 0.25 mg via INTRAVENOUS

## 2021-06-26 MED ORDER — HEPARIN SOD (PORK) LOCK FLUSH 100 UNIT/ML IV SOLN
500.0000 [IU] | Freq: Once | INTRAVENOUS | Status: AC | PRN
  Administered 2021-06-26: 500 [IU]
  Filled 2021-06-26: qty 5

## 2021-06-26 NOTE — Progress Notes (Signed)
[facility] Telephone:([phone]   [phone]  OFFICE PROGRESS NOTE  [name], NP [place] [address]  DIAGNOSIS: Relapsed extensive stage (T4, N3, M1c)  small cell lung cancer diagnosed in April 2021 and presented with large right upper lobe/right hilar mass with ipsilateral and contralateral mediastinal and right supraclavicular lymphadenopathy in addition to multiple liver lesios. He has disease progression after the first line of chemotherapy in December 2021.  PRIOR THERAPY:  1) Palliative radiotherapy to the right upper lobe lung mass under the care of Dr. Lisbeth Renshaw. 2) Systemic chemotherapy with carboplatin for AUC of 5 on day 1, etoposide 100 mg/M2 on days 1, 2 and 3 in addition to Imfinzi 1500 mg IV every 3 weeks with chemotherapy then every 4 weeks for maintenance if the patient has no evidence for progression.  He will also receive Cosela 240 mg/m2 on the days of the chemotherapy.  Status post 9 cycles.  Starting from cycle #5 the patient will be on maintenance treatment with immunotherapy with Imfinzi 1500 mg IV every 4 weeks. Last dose of chemotherapy was given on November 13, 2020. This treatment was discontinued secondary to disease progression. 3) Systemic chemotherapy with carboplatin for AUC of 5 on day 1, etoposide 100 mg/M2 on days 1, 2 and 3, Tecentriq 1200 mg IV every 3 weeks as well as Cosela 250 mg/M2 on the days of the chemotherapy every 3 weeks.  First dose December 18, 2020.  Status post 8 cycles.   CURRENT THERAPY: Zepzelca (lurbinectedin) 3.2 mgm2 IV every 3 weeks. First dose expected on 06/05/21. Status post 1 cycle.   INTERVAL HISTORY: Adam Wise 70 y.o. male returns to the clinic today for follow-up visit accompanied by his wife.  The patient is feeling fine today with no concerning complaints except for intermittent shortness of breath as well as cough.  He takes Tussionex for cough at nighttime with some  improvement of his symptoms.  He denied having any current chest pain or hemoptysis.  He denied having any nausea, vomiting, diarrhea or constipation.  He has no headache or visual changes.  He denied having any significant weight loss or night sweats.  He tolerated the first cycle of his treatment with Zepzelca (lurbinectedin) fairly well.  He is here today for evaluation before starting cycle #2 of his treatment.   MEDICAL HISTORY: Past Medical History:  Diagnosis Date   Cancer [facility])    Chickenpox    Chronic knee pain    Chronic low back pain    Essential hypertension    GERD (gastroesophageal reflux disease)    OSA (obstructive sleep apnea)    Testosterone deficiency    Type 2 diabetes mellitus (HCC)     ALLERGIES:  is allergic to bupropion.  MEDICATIONS:  Current Outpatient Medications  Medication Sig Dispense Refill   albuterol (VENTOLIN HFA) 108 (90 Base) MCG/ACT inhaler Inhale 2 puffs into the lungs every 6 (six) hours as needed for wheezing or shortness of breath. 18 g 0   amLODipine (NORVASC) 10 MG tablet TAKE 1 TABLET BY MOUTH EVERY DAY 90 tablet 1   B-D 3CC LUER-LOK SYR 22GX1" 22G X 1" 3 ML MISC USE AS INSTRUCTED FOR TESTOSTERONE INJECTION EVERY 2 WEEKS 10 each 2   Black Pepper-Turmeric (TURMERIC COMPLEX/BLACK PEPPER PO) Take 1 tablet by mouth in the morning and at bedtime.     blood glucose meter kit and supplies KIT Dispense based on  patient and insurance preference. Use up to four times daily as directed. 1 each 0   chlorpheniramine-HYDROcodone (TUSSIONEX) 10-8 MG/5ML SUER Take 5 mLs by mouth at bedtime as needed for cough. 140 mL 0   Coenzyme Q10 ([employer] COQ-10) 200 MG capsule Take 200 mg by mouth every evening.      ELIQUIS 5 MG TABS tablet TAKE 1 TABLET BY MOUTH TWICE A DAY 180 tablet 2   esomeprazole (NEXIUM) 20 MG capsule Take 20 mg by mouth daily.      fluticasone (FLONASE) 50 MCG/ACT nasal spray Place 2 sprays into both nostrils daily as needed for allergies.      gabapentin (NEURONTIN) 300 MG capsule Take 300 mg by mouth 2 (two) times daily.      glipiZIDE (GLUCOTROL) 10 MG tablet TAKE 1 TABLET (10 MG TOTAL) BY MOUTH 2 (TWO) TIMES DAILY BEFORE A MEAL. FOR DIABETES. 180 tablet 1   glucose blood (ONETOUCH ULTRA) test strip USE UP TO 4 TIMES DAILY AS DIRECTED 100 strip 5   Insulin Glargine (BASAGLAR KWIKPEN) 100 UNIT/ML Inject 20 Units into the skin at bedtime. For diabetes. 15 mL 3   Insulin Pen Needle (BD PEN NEEDLE NANO U/F) 32G X 4 MM MISC Use with insulin as prescribed  Dx Code: E11.9 100 each 2   KRILL OIL PO Take 350 mg by mouth daily.     Lancets MISC USE UP TO 3 TIMES DAILY AS DIRECTED 100 each 2   lidocaine-prilocaine (EMLA) cream Apply 1 application topically as needed. 30 g 2   lisinopril (ZESTRIL) 5 MG tablet TAKE 1 TABLET BY MOUTH EVERY DAY 90 tablet 1   Melatonin 10 MG CAPS Take 10 mg by mouth at bedtime.     metFORMIN (GLUCOPHAGE) 1000 MG tablet Take 1 tablet (1,000 mg total) by mouth 2 (two) times daily with a meal. For diabetes 180 tablet 0   Multiple Vitamin (MULTI-VITAMINS) TABS Take 1 tablet by mouth daily.      OVER THE COUNTER MEDICATION Take by mouth.     oxymetazoline (AFRIN) 0.05 % nasal spray Place 1 spray into both nostrils 2 (two) times daily as needed for congestion.     pioglitazone (ACTOS) 45 MG tablet Take 1 tablet (45 mg total) by mouth daily. For diabetes. 90 tablet 2   pravastatin (PRAVACHOL) 40 MG tablet TAKE 1 TABLET BY MOUTH EVERY DAY IN THE EVENING FOR CHOLESTEROL 90 tablet 1   prochlorperazine (COMPAZINE) 10 MG tablet Take 1 tablet (10 mg total) by mouth every 6 (six) hours as needed for nausea or vomiting. 30 tablet 0   sildenafil (REVATIO) 20 MG tablet Take 3-5 tablets by mouth 1 hour prior to intercourse as needed. 50 tablet 0   testosterone cypionate (DEPOTESTOSTERONE CYPIONATE) 200 MG/ML injection Inject 200 mg into the muscle every 14 (fourteen) days.     No current facility-administered medications for this  visit.    SURGICAL HISTORY:  Past Surgical History:  Procedure Laterality Date   COLONOSCOPY WITH PROPOFOL N/A 12/17/2018   Procedure: COLONOSCOPY WITH PROPOFOL;  Surgeon: Virgel Manifold, MD;  Location: [facility] ENDOSCOPY;  Service: Gastroenterology;  Laterality: N/A;   IR IMAGING GUIDED PORT INSERTION  04/17/2020   JOINT REPLACEMENT Bilateral    REPLACEMENT TOTAL KNEE BILATERAL  2015   TONSILLECTOMY  1960    REVIEW OF SYSTEMS:  A comprehensive review of systems was negative except for: Respiratory: positive for cough and dyspnea on exertion   PHYSICAL EXAMINATION: General appearance: alert,  cooperative, fatigued, and no distress Head: atraumatic Neck: no adenopathy, no JVD, supple, symmetrical, trachea midline, and thyroid not enlarged, symmetric, no tenderness/mass/nodules Lymph nodes: Cervical, supraclavicular, and axillary nodes normal. Resp: diminished breath sounds RLL and dullness to percussion RLL Back: symmetric, no curvature. ROM normal. No CVA tenderness. Cardio: regular rate and rhythm, S1, S2 normal, no murmur, click, rub or gallop GI: soft, non-tender; bowel sounds normal; no masses,  no organomegaly Extremities: extremities normal, atraumatic, no cyanosis or edema  ECOG PERFORMANCE STATUS: 1 - Symptomatic but completely ambulatory  Blood pressure 140/78, pulse (!) 102, temperature (!) 97 F (36.1 C), temperature source Tympanic, resp. rate 20, height $RemoveBe'5\' 9"'RtQibohDW$  (1.753 m), weight 290 lb 14.4 oz (132 kg), SpO2 97 %.  LABORATORY DATA: Lab Results  Component Value Date   WBC 4.4 06/26/2021   HGB 14.1 06/26/2021   HCT 42.7 06/26/2021   MCV 89.0 06/26/2021   PLT 217 06/26/2021      Chemistry      Component Value Date/Time   NA 141 06/19/2021 1250   K 4.6 06/19/2021 1250   CL 106 06/19/2021 1250   CO2 27 06/19/2021 1250   BUN 11 06/19/2021 1250   CREATININE 1.27 (H) 06/19/2021 1250      Component Value Date/Time   CALCIUM 9.0 06/19/2021 1250   ALKPHOS 70  06/19/2021 1250   AST 27 06/19/2021 1250   ALT 30 06/19/2021 1250   BILITOT 0.3 06/19/2021 1250       RADIOGRAPHIC STUDIES: DG Chest 2 View  Result Date: 06/12/2021 CLINICAL DATA:  History of lung cancer.  Pleural effusion. EXAM: CHEST - 2 VIEW COMPARISON:  February 04, 2021. FINDINGS: Stable cardiomediastinal silhouette. Right internal jugular Port-A-Cath is unchanged in position. Stable post treatment change and or mass lesion is seen in the right suprahilar region. Left lung is clear. Elevated right hemidiaphragm is noted. Probable right basilar scarring is noted. Minimal right pleural effusion may be present. Bony thorax is unremarkable. IMPRESSION: Minimal right pleural effusion may be present. Right basilar scarring is noted with elevated right hemidiaphragm. Electronically Signed   By: Marijo Conception M.D.   On: 06/12/2021 13:16    ASSESSMENT AND PLAN: This is a very pleasant 70 years old white male recently diagnosed with extensive stage (T4, N3, M1 C) small cell lung cancer presented with large right upper lobe lung mass in addition to mediastinal and right supraclavicular lymphadenopathy and multiple metastatic liver lesions diagnosed in April 2021. The patient underwent systemic chemotherapy with carboplatin for AUC of 5 on day 1, etoposide 100 mg/M2 on days 1, 2 and 3 in addition to Cosela for myeloprotection during the days of the chemotherapy.  He will also receive immunotherapy with Imfinzi on day one of the chemotherapy cycle.  Status post 9 cycles.  Starting from cycle #5 he is on maintenance treatment with Imfinzi 1500 mg IV every 4 weeks. The patient has been tolerating this treatment well with no concerning adverse effect except for the worsening cough and shortness of breath recently.   He had repeat CT scan of the chest, abdomen pelvis performed recently.  I personally and independently reviewed the scan images and discussed the results with the patient and his wife  today. Unfortunately his scan showed evidence for disease progression with increase in the right pleural effusion as well as enlargement of multiple liver lesions. I recommended for the patient to discontinue his current maintenance treatment with Imfinzi at this point. He resumed systemic chemotherapy  with carboplatin for AUC of 5 on day 1, etoposide 100 mg/M2 on days 1, 2 and 3 with Tecentriq 1200 mg every 3 weeks as well as Cosela on the days of the chemotherapy.  He is status post 8 cycles.  This treatment was discontinued secondary to disease progression. The patient is currently undergoing third line treatment with systemic chemotherapy with Zepzelca (lurbinectedin) 3.2 Mg/KG every 3 weeks status post 1 cycle. The patient tolerated the first cycle of this treatment well with no concerning adverse effects. Repeat CBC today is unremarkable for any abnormality. I recommended for him to proceed with cycle #2 today as planned. Regarding the recurrent right pleural effusion, I recommended for the patient to have repeat chest x-ray and ultrasound-guided thoracentesis if needed but he would like to hold on the chest x-ray and drainage for now but he will call if he becomes more symptomatic. He will come back for follow-up visit in 3 weeks for evaluation before the next cycle of his treatment. The patient was advised to call immediately if he has any other concerning symptoms in the interval. The patient voices understanding of current disease status and treatment options and is in agreement with the current care plan.  All questions were answered. The patient knows to call the clinic with any problems, questions or concerns. We can certainly see the patient much sooner if necessary.  Disclaimer: This note was dictated with voice recognition software. Similar sounding words can inadvertently be transcribed and may not be corrected upon review.

## 2021-06-26 NOTE — Patient Instructions (Signed)
[facility]   Discharge Instructions: Thank you for choosing [facility] to provide your oncology and hematology care.   If you have a lab appointment with the [facility], please go directly to the [facility] and check in at the registration area.   Wear comfortable clothing and clothing appropriate for easy access to any Portacath or PICC line.   We strive to give you quality time with your provider. You may need to reschedule your appointment if you arrive late (15 or more minutes).  Arriving late affects you and other patients whose appointments are after yours.  Also, if you miss three or more appointments without notifying the office, you may be dismissed from the clinic at the provider's discretion.      For prescription refill requests, have your pharmacy contact our office and allow 72 hours for refills to be completed.    Today you received the following chemotherapy and/or immunotherapy agents: Lurbinectedin       To help prevent nausea and vomiting after your treatment, we encourage you to take your nausea medication as directed.  BELOW ARE SYMPTOMS THAT SHOULD BE REPORTED IMMEDIATELY: *FEVER GREATER THAN 100.4 F (38 C) OR HIGHER *CHILLS OR SWEATING *NAUSEA AND VOMITING THAT IS NOT CONTROLLED WITH YOUR NAUSEA MEDICATION *UNUSUAL SHORTNESS OF BREATH *UNUSUAL BRUISING OR BLEEDING *URINARY PROBLEMS (pain or burning when urinating, or frequent urination) *BOWEL PROBLEMS (unusual diarrhea, constipation, pain near the anus) TENDERNESS IN MOUTH AND THROAT WITH OR WITHOUT PRESENCE OF ULCERS (sore throat, sores in mouth, or a toothache) UNUSUAL RASH, SWELLING OR PAIN  UNUSUAL VAGINAL DISCHARGE OR ITCHING   Items with * indicate a potential emergency and should be followed up as soon as possible or go to the Emergency Department if any problems should occur.  Please show the CHEMOTHERAPY ALERT CARD or IMMUNOTHERAPY ALERT CARD at  check-in to the Emergency Department and triage nurse.  Should you have questions after your visit or need to cancel or reschedule your appointment, please contact [facility]  Dept: [phone]  and follow the prompts.  Office hours are 8:00 a.m. to 4:30 p.m. Monday - Friday. Please note that voicemails left after 4:00 p.m. may not be returned until the following business day.  We are closed weekends and major holidays. You have access to a nurse at all times for urgent questions. Please call the main number to the clinic Dept: [phone] and follow the prompts.   For any non-urgent questions, you may also contact your provider using MyChart. We now offer e-Visits for anyone 27 and older to request care online for non-urgent symptoms. For details visit [email or web address].   Also download the MyChart app! Go to the app store, search "MyChart", open the app, select [facility], and log in with your MyChart username and password.  Due to Covid, a mask is required upon entering the hospital/clinic. If you do not have a mask, one will be given to you upon arrival. For doctor visits, patients may have 1 support person aged 103 or older with them. For treatment visits, patients cannot have anyone with them due to current Covid guidelines and our immunocompromised population.

## 2021-07-03 ENCOUNTER — Inpatient Hospital Stay: Payer: Medicare Other

## 2021-07-03 ENCOUNTER — Other Ambulatory Visit: Payer: Self-pay | Admitting: Physician Assistant

## 2021-07-03 ENCOUNTER — Other Ambulatory Visit: Payer: Self-pay

## 2021-07-03 DIAGNOSIS — Z79899 Other long term (current) drug therapy: Secondary | ICD-10-CM | POA: Diagnosis not present

## 2021-07-03 DIAGNOSIS — Z452 Encounter for adjustment and management of vascular access device: Secondary | ICD-10-CM | POA: Diagnosis not present

## 2021-07-03 DIAGNOSIS — Z95828 Presence of other vascular implants and grafts: Secondary | ICD-10-CM

## 2021-07-03 DIAGNOSIS — J90 Pleural effusion, not elsewhere classified: Secondary | ICD-10-CM | POA: Diagnosis not present

## 2021-07-03 DIAGNOSIS — C3411 Malignant neoplasm of upper lobe, right bronchus or lung: Secondary | ICD-10-CM | POA: Diagnosis not present

## 2021-07-03 DIAGNOSIS — Z5111 Encounter for antineoplastic chemotherapy: Secondary | ICD-10-CM | POA: Diagnosis not present

## 2021-07-03 LAB — CBC WITH DIFFERENTIAL (CANCER CENTER ONLY)
Abs Immature Granulocytes: 0.02 10*3/uL (ref 0.00–0.07)
Basophils Absolute: 0.1 10*3/uL (ref 0.0–0.1)
Basophils Relative: 1 %
Eosinophils Absolute: 0 10*3/uL (ref 0.0–0.5)
Eosinophils Relative: 1 %
HCT: 41.6 % (ref 39.0–52.0)
Hemoglobin: 13.8 g/dL (ref 13.0–17.0)
Immature Granulocytes: 0 %
Lymphocytes Relative: 15 %
Lymphs Abs: 0.7 10*3/uL (ref 0.7–4.0)
MCH: 29.3 pg (ref 26.0–34.0)
MCHC: 33.2 g/dL (ref 30.0–36.0)
MCV: 88.3 fL (ref 80.0–100.0)
Monocytes Absolute: 0.1 10*3/uL (ref 0.1–1.0)
Monocytes Relative: 2 %
Neutro Abs: 3.7 10*3/uL (ref 1.7–7.7)
Neutrophils Relative %: 81 %
Platelet Count: 130 10*3/uL — ABNORMAL LOW (ref 150–400)
RBC: 4.71 MIL/uL (ref 4.22–5.81)
RDW: 13.2 % (ref 11.5–15.5)
WBC Count: 4.6 10*3/uL (ref 4.0–10.5)
nRBC: 0 % (ref 0.0–0.2)

## 2021-07-03 MED ORDER — SODIUM CHLORIDE 0.9% FLUSH
10.0000 mL | Freq: Once | INTRAVENOUS | Status: AC
  Administered 2021-07-03: 10 mL
  Filled 2021-07-03: qty 10

## 2021-07-03 MED ORDER — HEPARIN SOD (PORK) LOCK FLUSH 100 UNIT/ML IV SOLN
500.0000 [IU] | Freq: Once | INTRAVENOUS | Status: AC
  Administered 2021-07-03: 500 [IU]
  Filled 2021-07-03: qty 5

## 2021-07-04 ENCOUNTER — Other Ambulatory Visit: Payer: Self-pay | Admitting: Primary Care

## 2021-07-04 DIAGNOSIS — E1169 Type 2 diabetes mellitus with other specified complication: Secondary | ICD-10-CM

## 2021-07-04 DIAGNOSIS — E785 Hyperlipidemia, unspecified: Secondary | ICD-10-CM

## 2021-07-04 DIAGNOSIS — E119 Type 2 diabetes mellitus without complications: Secondary | ICD-10-CM

## 2021-07-04 DIAGNOSIS — I10 Essential (primary) hypertension: Secondary | ICD-10-CM

## 2021-07-10 ENCOUNTER — Other Ambulatory Visit: Payer: Self-pay

## 2021-07-10 ENCOUNTER — Inpatient Hospital Stay: Payer: Medicare Other

## 2021-07-10 ENCOUNTER — Other Ambulatory Visit: Payer: Self-pay | Admitting: Primary Care

## 2021-07-10 DIAGNOSIS — Z95828 Presence of other vascular implants and grafts: Secondary | ICD-10-CM

## 2021-07-10 DIAGNOSIS — E119 Type 2 diabetes mellitus without complications: Secondary | ICD-10-CM

## 2021-07-10 DIAGNOSIS — Z452 Encounter for adjustment and management of vascular access device: Secondary | ICD-10-CM | POA: Diagnosis not present

## 2021-07-10 DIAGNOSIS — C3411 Malignant neoplasm of upper lobe, right bronchus or lung: Secondary | ICD-10-CM | POA: Diagnosis not present

## 2021-07-10 DIAGNOSIS — Z79899 Other long term (current) drug therapy: Secondary | ICD-10-CM | POA: Diagnosis not present

## 2021-07-10 DIAGNOSIS — Z5111 Encounter for antineoplastic chemotherapy: Secondary | ICD-10-CM | POA: Diagnosis not present

## 2021-07-10 DIAGNOSIS — J90 Pleural effusion, not elsewhere classified: Secondary | ICD-10-CM | POA: Diagnosis not present

## 2021-07-10 LAB — CBC WITH DIFFERENTIAL (CANCER CENTER ONLY)
Abs Immature Granulocytes: 0.01 10*3/uL (ref 0.00–0.07)
Basophils Absolute: 0 10*3/uL (ref 0.0–0.1)
Basophils Relative: 1 %
Eosinophils Absolute: 0.1 10*3/uL (ref 0.0–0.5)
Eosinophils Relative: 3 %
HCT: 40.6 % (ref 39.0–52.0)
Hemoglobin: 13.7 g/dL (ref 13.0–17.0)
Immature Granulocytes: 1 %
Lymphocytes Relative: 35 %
Lymphs Abs: 0.8 10*3/uL (ref 0.7–4.0)
MCH: 29.4 pg (ref 26.0–34.0)
MCHC: 33.7 g/dL (ref 30.0–36.0)
MCV: 87.1 fL (ref 80.0–100.0)
Monocytes Absolute: 0.5 10*3/uL (ref 0.1–1.0)
Monocytes Relative: 21 %
Neutro Abs: 0.8 10*3/uL — ABNORMAL LOW (ref 1.7–7.7)
Neutrophils Relative %: 39 %
Platelet Count: 143 10*3/uL — ABNORMAL LOW (ref 150–400)
RBC: 4.66 MIL/uL (ref 4.22–5.81)
RDW: 14 % (ref 11.5–15.5)
WBC Count: 2.2 10*3/uL — ABNORMAL LOW (ref 4.0–10.5)
nRBC: 0 % (ref 0.0–0.2)

## 2021-07-10 LAB — CMP (CANCER CENTER ONLY)
ALT: 30 U/L (ref 0–44)
AST: 20 U/L (ref 15–41)
Albumin: 3.8 g/dL (ref 3.5–5.0)
Alkaline Phosphatase: 67 U/L (ref 38–126)
Anion gap: 9 (ref 5–15)
BUN: 13 mg/dL (ref 8–23)
CO2: 27 mmol/L (ref 22–32)
Calcium: 9.3 mg/dL (ref 8.9–10.3)
Chloride: 104 mmol/L (ref 98–111)
Creatinine: 1.22 mg/dL (ref 0.61–1.24)
GFR, Estimated: >60 mL/min (ref >60)
Glucose, Bld: 133 mg/dL — ABNORMAL HIGH (ref 70–99)
Potassium: 4.2 mmol/L (ref 3.5–5.1)
Sodium: 140 mmol/L (ref 135–145)
Total Bilirubin: 0.3 mg/dL (ref 0.3–1.2)
Total Protein: 6.5 g/dL (ref 6.5–8.1)

## 2021-07-10 MED ORDER — HEPARIN SOD (PORK) LOCK FLUSH 100 UNIT/ML IV SOLN
500.0000 [IU] | Freq: Once | INTRAVENOUS | Status: AC
  Administered 2021-07-10: 500 [IU]
  Filled 2021-07-10: qty 5

## 2021-07-10 MED ORDER — SODIUM CHLORIDE 0.9% FLUSH
10.0000 mL | Freq: Once | INTRAVENOUS | Status: AC
  Administered 2021-07-10: 10 mL
  Filled 2021-07-10: qty 10

## 2021-07-15 NOTE — Progress Notes (Signed)
[facility] OFFICE PROGRESS NOTE  [name], NP [place] [address]  DIAGNOSIS: Relapsed extensive stage (T4, N3, M1c)  small cell lung cancer diagnosed in April 2021 and presented with large right upper lobe/right hilar mass with ipsilateral and contralateral mediastinal and right supraclavicular lymphadenopathy in addition to multiple liver lesios. He has disease progression after the first line of chemotherapy in December 2021.  PRIOR THERAPY: 1) Palliative radiotherapy to the right upper lobe lung mass under the care of Dr. Lisbeth Renshaw. 2) Systemic chemotherapy with carboplatin for AUC of 5 on day 1, etoposide 100 mg/M2 on days 1, 2 and 3 in addition to Imfinzi 1500 mg IV every 3 weeks with chemotherapy then every 4 weeks for maintenance if the patient has no evidence for progression.  He will also receive Cosela 240 mg/m2 on the days of the chemotherapy.  Status post 9 cycles.  Starting from cycle #5 the patient will be on maintenance treatment with immunotherapy with Imfinzi 1500 mg IV every 4 weeks. Last dose of chemotherapy was given on November 13, 2020. This treatment was discontinued secondary to disease progression 3) Systemic chemotherapy with carboplatin for AUC of 5 on day 1, etoposide 100 mg/M2 on days 1, 2 and 3, Tecentriq 1200 mg IV every 3 weeks as well as Cosela 250 mg/M2 on the days of the chemotherapy every 3 weeks.  First dose December 18, 2020.  Status post 8 cycles.  CURRENT THERAPY:  Zepzelca 3.2 mgm2 IV every 3 weeks. First dose expected on 06/05/21. Status post 2 cycles.   INTERVAL HISTORY: Adam Wise 70 y.o. male returns to the clinic today for a follow-up visit accompanied by his wife. The patient is feeling well overall today without any concerning complaints except for he needs a refill of his Tussinex for his cough. He believes his baseline cough may be a little worse. Sometimes it is productive with yellow/white phlegm and other  times non-productive. He also has slight increase in his dyspnea on exertion and his symptoms wax and wane. He sometimes feels right posterior thoracic rib discomfort due to his history of a right pleural effusion. He does not feel his pleural fluid needs to be evaluated urgently. He denies systemic symptoms of infection such as fevers, chills, night sweats, sore throat, or nasal congestion.   The patient tolerated his last cycle of treatment well except for he did have some neutropenia which resolved without any intervention. He denies dysuria, diarrhea, or skin infections. His weight is stable. He reports fatigue especially following the first two days of treatment but states that his fatigue was not as bad after his last infusion. After recovering from that, his energy levels have been stable. He denies any nausea, vomiting, diarrhea, or constipation.  He denies any headache or visual changes.  He denies any new rashes or skin changes. He is here for evaluation before starting cycle #3 of treatment.    MEDICAL HISTORY: Past Medical History:  Diagnosis Date   Cancer [facility])    Chickenpox    Chronic knee pain    Chronic low back pain    Essential hypertension    GERD (gastroesophageal reflux disease)    OSA (obstructive sleep apnea)    Testosterone deficiency    Type 2 diabetes mellitus (HCC)     ALLERGIES:  is allergic to bupropion.  MEDICATIONS:  Current Outpatient Medications  Medication Sig Dispense Refill   albuterol (VENTOLIN HFA) 108 (90 Base) MCG/ACT  inhaler Inhale 2 puffs into the lungs every 6 (six) hours as needed for wheezing or shortness of breath. 18 g 0   amLODipine (NORVASC) 10 MG tablet TAKE 1 TABLET BY MOUTH EVERY DAY for blood pressure. 90 tablet 1   B-D 3CC LUER-LOK SYR 22GX1" 22G X 1" 3 ML MISC USE AS INSTRUCTED FOR TESTOSTERONE INJECTION EVERY 2 WEEKS 10 each 2   Black Pepper-Turmeric (TURMERIC COMPLEX/BLACK PEPPER PO) Take 1 tablet by mouth in the morning and at  bedtime.     blood glucose meter kit and supplies KIT Dispense based on patient and insurance preference. Use up to four times daily as directed. 1 each 0   chlorpheniramine-HYDROcodone (TUSSIONEX) 10-8 MG/5ML SUER Take 5 mLs by mouth at bedtime as needed for cough. 473 mL 0   Coenzyme Q10 ([employer] COQ-10) 200 MG capsule Take 200 mg by mouth every evening.      ELIQUIS 5 MG TABS tablet TAKE 1 TABLET BY MOUTH TWICE A DAY 180 tablet 2   esomeprazole (NEXIUM) 20 MG capsule Take 20 mg by mouth daily.      fluticasone (FLONASE) 50 MCG/ACT nasal spray Place 2 sprays into both nostrils daily as needed for allergies.     gabapentin (NEURONTIN) 300 MG capsule Take 300 mg by mouth 2 (two) times daily.      glipiZIDE (GLUCOTROL) 10 MG tablet TAKE 1 TABLET (10 MG TOTAL) BY MOUTH 2 (TWO) TIMES DAILY BEFORE A MEAL. FOR DIABETES. 180 tablet 1   Insulin Glargine (BASAGLAR KWIKPEN) 100 UNIT/ML Inject 20 Units into the skin at bedtime. For diabetes. 15 mL 3   Insulin Pen Needle (BD PEN NEEDLE NANO U/F) 32G X 4 MM MISC Use with insulin as prescribed  Dx Code: E11.9 100 each 2   KRILL OIL PO Take 350 mg by mouth daily.     Lancets MISC USE UP TO 3 TIMES DAILY AS DIRECTED 100 each 2   lidocaine-prilocaine (EMLA) cream Apply 1 application topically as needed. 30 g 2   lisinopril (ZESTRIL) 5 MG tablet TAKE 1 TABLET BY MOUTH EVERY DAY for blood pressure. 90 tablet 1   Melatonin 10 MG CAPS Take 10 mg by mouth at bedtime.     metFORMIN (GLUCOPHAGE) 1000 MG tablet Take 1 tablet (1,000 mg total) by mouth 2 (two) times daily with a meal. For diabetes 180 tablet 0   Multiple Vitamin (MULTI-VITAMINS) TABS Take 1 tablet by mouth daily.      ONETOUCH ULTRA test strip USE UP TO 4 TIMES DAILY AS DIRECTED 100 strip 5   OVER THE COUNTER MEDICATION Take by mouth.     oxymetazoline (AFRIN) 0.05 % nasal spray Place 1 spray into both nostrils 2 (two) times daily as needed for congestion.     pioglitazone (ACTOS) 45 MG tablet Take 1  tablet (45 mg total) by mouth daily. For diabetes. 90 tablet 2   pravastatin (PRAVACHOL) 40 MG tablet TAKE 1 TABLET BY MOUTH EVERY DAY IN THE EVENING FOR CHOLESTEROL 90 tablet 1   prochlorperazine (COMPAZINE) 10 MG tablet Take 1 tablet (10 mg total) by mouth every 6 (six) hours as needed for nausea or vomiting. 30 tablet 0   sildenafil (REVATIO) 20 MG tablet Take 3-5 tablets by mouth 1 hour prior to intercourse as needed. 50 tablet 0   testosterone cypionate (DEPOTESTOSTERONE CYPIONATE) 200 MG/ML injection Inject 200 mg into the muscle every 14 (fourteen) days.     No current facility-administered medications for this visit.  SURGICAL HISTORY:  Past Surgical History:  Procedure Laterality Date   COLONOSCOPY WITH PROPOFOL N/A 12/17/2018   Procedure: COLONOSCOPY WITH PROPOFOL;  Surgeon: Virgel Manifold, MD;  Location: [facility] ENDOSCOPY;  Service: Gastroenterology;  Laterality: N/A;   IR IMAGING GUIDED PORT INSERTION  04/17/2020   JOINT REPLACEMENT Bilateral    REPLACEMENT TOTAL KNEE BILATERAL  2015   TONSILLECTOMY  1960    REVIEW OF SYSTEMS:   Review of Systems  Constitutional: Positive for fatigue. Negative for appetite change, chills, fever and unexpected weight change. HENT: Negative for mouth sores, nosebleeds, sore throat and trouble swallowing.   Eyes: Negative for eye problems and icterus. Respiratory: Positive for dyspnea on exertion and increased cough. Negative for hemoptysis and wheezing.   Cardiovascular: Negative for chest pain and leg swelling. Gastrointestinal: Negative for abdominal pain, constipation, diarrhea, nausea and vomiting. Genitourinary: Negative for bladder incontinence, difficulty urinating, dysuria, frequency and hematuria.   Musculoskeletal: Negative for back pain, gait problem, neck pain and neck stiffness. Skin: Negative for itching and rash. Neurological: Negative for dizziness, extremity weakness, gait problem, headaches, light-headedness and  seizures. Hematological: Negative for adenopathy. Does not bruise/bleed easily. Psychiatric/Behavioral: Negative for confusion, depression and sleep disturbance. The patient is not nervous/anxious.       PHYSICAL EXAMINATION:  Blood pressure (!) 153/90, pulse 99, temperature (!) 97.5 F (36.4 C), temperature source Tympanic, resp. rate (!) 21, height $RemoveBe'5\' 9"'KJdGVxcqt$  (1.753 m), weight 291 lb 8 oz (132.2 kg), SpO2 95 %.  ECOG PERFORMANCE STATUS: 1  Physical Exam  Constitutional: Oriented to person, place, and time and well-developed, well-nourished, and in no distress. HENT: Head: Normocephalic and atraumatic. Mouth/Throat: Oropharynx is clear and moist. No oropharyngeal exudate. Eyes: Conjunctivae are normal. Right eye exhibits no discharge. Left eye exhibits no discharge. No scleral icterus. Neck: Normal range of motion. Neck supple. Cardiovascular: Normal rate, regular rhythm, normal heart sounds and intact distal pulses.   Pulmonary/Chest: Decreased breath sounds over the right lower lobe. Effort normal.  No respiratory distress. No wheezes. No rales. Abdominal: Soft. Bowel sounds are normal. Exhibits no distension and no mass. There is no tenderness.  Musculoskeletal: Normal range of motion. Exhibits no edema.  Lymphadenopathy:    No cervical adenopathy.  Neurological: Alert and oriented to person, place, and time. Exhibits normal muscle tone. Gait normal. Coordination normal. Skin: Skin is warm and dry. No rash noted. Not diaphoretic. No erythema. No pallor.  Psychiatric: Mood, memory and judgment normal. Vitals reviewed.  LABORATORY DATA: Lab Results  Component Value Date   WBC 4.2 07/17/2021   HGB 13.2 07/17/2021   HCT 40.6 07/17/2021   MCV 89.6 07/17/2021   PLT 230 07/17/2021      Chemistry      Component Value Date/Time   NA 143 07/17/2021 1413   K 4.6 07/17/2021 1413   CL 107 07/17/2021 1413   CO2 26 07/17/2021 1413   BUN 11 07/17/2021 1413   CREATININE 1.27 (H)  07/17/2021 1413      Component Value Date/Time   CALCIUM 9.3 07/17/2021 1413   ALKPHOS 66 07/17/2021 1413   AST 26 07/17/2021 1413   ALT 27 07/17/2021 1413   BILITOT 0.4 07/17/2021 1413       RADIOGRAPHIC STUDIES:  No results found.   ASSESSMENT/PLAN:  This is a very pleasant 70 year old Caucasian male  diagnosed with extensive stage (T4, N3, M1C) small cell lung cancer presented with large right upper lobe lung mass in addition to mediastinal and right  supraclavicular lymphadenopathy and multiple metastatic liver lesions diagnosed in April 2021.   The patient initially underwent systemic chemotherapy with carboplatin for an AUC of 5 on day 1, etoposide 100 mg per metered squared on days 1, 2, and 3 in addition to [facility] for myeloprotection.  He also received immunotherapy with Imfinzi on day 1 of every chemotherapy cycle.  He is status post 9 cycles.  Starting from cycle #5 he was on single agent immunotherapy with Imfinzi IV every 4 weeks.   The patient had evidence of disease progression on his scan from December 2021.  He was then started on systemic chemotherapy with carboplatin for an AUC of 5 on day 1, etoposide 100 mg per metered squared on days 1, 2, and 3 in addition to Tecentriq 1200 mg on day 1, the patient is status post 8 cycles.  Starting from cycle #5, the patient started maintenance immunotherapy with Tecentriq.  This was discontinued due to evidence of disease progression.  The patient is currently undergoing palliative systemic chemotherapy with Zepzelca IV every 3 weeks.  He is status post his 2 cycles and he tolerated it well except for fatigue a few days after treatment.     Labs reviewed. He had some neutropenia last week which resolved. No signs or symptoms of infection today. Recommend he proceed with cycle #3 today scheduled.  I will arrange for restaging CT scan the chest, abdomen, and pelvis prior to starting his next cycle of treatment and to reassess his  disease. He has had slightly worsening cough and dyspnea on exertion compared to baseline.   For his cough and shortness of breath, he has a history of a pleural effusion. I offered a CXR today to reassess the fluid. They politely declined because they will be leaving town and will not have time to undergo a thoracentesis. The patient believes he does not need an urgent thoracentesis at this time and will wait until his upcoming CT scan to re-assess the fluid. Of course, I reinforced that should he develop significantly worsening shortness of breath in the interval while out of town, to go to the emergency room. He was well appearing today and in no acute distress. Oxygen saturation was 95% today on RA.   I refilled his tussinex today.   The patient was advised to call immediately if she has any concerning symptoms in the interval. The patient voices understanding of current disease status and treatment options and is in agreement with the current care plan. All questions were answered. The patient knows to call the clinic with any problems, questions or concerns. We can certainly see the patient much sooner if necessary  Orders Placed This Encounter  Procedures   CT Chest W Contrast    Standing Status:   Future    Standing Expiration Date:   07/17/2022    Order Specific Question:   If indicated for the ordered procedure, I authorize the administration of contrast media per Radiology protocol    Answer:   Yes    Order Specific Question:   Preferred imaging location?    Answer:   [facility]   CT Abdomen Pelvis W Contrast    Standing Status:   Future    Standing Expiration Date:   07/17/2022    Order Specific Question:   If indicated for the ordered procedure, I authorize the administration of contrast media per Radiology protocol    Answer:   Yes    Order Specific Question:  Preferred imaging location?    Answer:   [facility]    Order Specific Question:   Is Oral Contrast  requested for this exam?    Answer:   Yes, Per Radiology protocol      The total time spent in the appointment was 20-29 minutes.   Stepen Prins L Valree Feild, PA-C 07/17/21

## 2021-07-17 ENCOUNTER — Inpatient Hospital Stay: Payer: Medicare Other

## 2021-07-17 ENCOUNTER — Encounter: Payer: Self-pay | Admitting: Physician Assistant

## 2021-07-17 ENCOUNTER — Other Ambulatory Visit: Payer: Self-pay

## 2021-07-17 ENCOUNTER — Inpatient Hospital Stay: Payer: Medicare Other | Attending: Internal Medicine | Admitting: Physician Assistant

## 2021-07-17 VITALS — BP 153/90 | HR 99 | Temp 97.5°F | Resp 21 | Ht 69.0 in | Wt 291.5 lb

## 2021-07-17 DIAGNOSIS — Z452 Encounter for adjustment and management of vascular access device: Secondary | ICD-10-CM | POA: Insufficient documentation

## 2021-07-17 DIAGNOSIS — Z95828 Presence of other vascular implants and grafts: Secondary | ICD-10-CM

## 2021-07-17 DIAGNOSIS — R0602 Shortness of breath: Secondary | ICD-10-CM | POA: Insufficient documentation

## 2021-07-17 DIAGNOSIS — R059 Cough, unspecified: Secondary | ICD-10-CM | POA: Diagnosis not present

## 2021-07-17 DIAGNOSIS — R0609 Other forms of dyspnea: Secondary | ICD-10-CM | POA: Diagnosis not present

## 2021-07-17 DIAGNOSIS — C787 Secondary malignant neoplasm of liver and intrahepatic bile duct: Secondary | ICD-10-CM | POA: Diagnosis not present

## 2021-07-17 DIAGNOSIS — C3411 Malignant neoplasm of upper lobe, right bronchus or lung: Secondary | ICD-10-CM | POA: Insufficient documentation

## 2021-07-17 DIAGNOSIS — Z5111 Encounter for antineoplastic chemotherapy: Secondary | ICD-10-CM | POA: Insufficient documentation

## 2021-07-17 LAB — CMP (CANCER CENTER ONLY)
ALT: 27 U/L (ref 0–44)
AST: 26 U/L (ref 15–41)
Albumin: 3.8 g/dL (ref 3.5–5.0)
Alkaline Phosphatase: 66 U/L (ref 38–126)
Anion gap: 10 (ref 5–15)
BUN: 11 mg/dL (ref 8–23)
CO2: 26 mmol/L (ref 22–32)
Calcium: 9.3 mg/dL (ref 8.9–10.3)
Chloride: 107 mmol/L (ref 98–111)
Creatinine: 1.27 mg/dL — ABNORMAL HIGH (ref 0.61–1.24)
GFR, Estimated: >60 mL/min (ref >60)
Glucose, Bld: 104 mg/dL — ABNORMAL HIGH (ref 70–99)
Potassium: 4.6 mmol/L (ref 3.5–5.1)
Sodium: 143 mmol/L (ref 135–145)
Total Bilirubin: 0.4 mg/dL (ref 0.3–1.2)
Total Protein: 6.5 g/dL (ref 6.5–8.1)

## 2021-07-17 LAB — CBC WITH DIFFERENTIAL (CANCER CENTER ONLY)
Abs Immature Granulocytes: 0.06 10*3/uL (ref 0.00–0.07)
Basophils Absolute: 0 10*3/uL (ref 0.0–0.1)
Basophils Relative: 1 %
Eosinophils Absolute: 0.1 10*3/uL (ref 0.0–0.5)
Eosinophils Relative: 2 %
HCT: 40.6 % (ref 39.0–52.0)
Hemoglobin: 13.2 g/dL (ref 13.0–17.0)
Immature Granulocytes: 1 %
Lymphocytes Relative: 20 %
Lymphs Abs: 0.8 10*3/uL (ref 0.7–4.0)
MCH: 29.1 pg (ref 26.0–34.0)
MCHC: 32.5 g/dL (ref 30.0–36.0)
MCV: 89.6 fL (ref 80.0–100.0)
Monocytes Absolute: 0.7 10*3/uL (ref 0.1–1.0)
Monocytes Relative: 17 %
Neutro Abs: 2.5 10*3/uL (ref 1.7–7.7)
Neutrophils Relative %: 59 %
Platelet Count: 230 10*3/uL (ref 150–400)
RBC: 4.53 MIL/uL (ref 4.22–5.81)
RDW: 15 % (ref 11.5–15.5)
WBC Count: 4.2 10*3/uL (ref 4.0–10.5)
nRBC: 0 % (ref 0.0–0.2)

## 2021-07-17 MED ORDER — PALONOSETRON HCL INJECTION 0.25 MG/5ML
0.2500 mg | Freq: Once | INTRAVENOUS | Status: AC
  Administered 2021-07-17: 0.25 mg via INTRAVENOUS

## 2021-07-17 MED ORDER — SODIUM CHLORIDE 0.9% FLUSH
10.0000 mL | Freq: Once | INTRAVENOUS | Status: AC
  Administered 2021-07-17: 10 mL
  Filled 2021-07-17: qty 10

## 2021-07-17 MED ORDER — HEPARIN SOD (PORK) LOCK FLUSH 100 UNIT/ML IV SOLN
500.0000 [IU] | Freq: Once | INTRAVENOUS | Status: AC | PRN
Start: 2021-07-17 — End: 2021-07-17
  Administered 2021-07-17: 500 [IU]
  Filled 2021-07-17: qty 5

## 2021-07-17 MED ORDER — PALONOSETRON HCL INJECTION 0.25 MG/5ML
INTRAVENOUS | Status: AC
  Filled 2021-07-17: qty 5

## 2021-07-17 MED ORDER — HYDROCOD POLST-CPM POLST ER 10-8 MG/5ML PO SUER: 5.0000 mL | Freq: Every evening | ORAL | 0 refills | Status: DC | PRN

## 2021-07-17 MED ORDER — SODIUM CHLORIDE 0.9% FLUSH
10.0000 mL | INTRAVENOUS | Status: DC | PRN
  Administered 2021-07-17: 10 mL
  Filled 2021-07-17: qty 10

## 2021-07-17 MED ORDER — SODIUM CHLORIDE 0.9 % IV SOLN
3.1300 mg/m2 | Freq: Once | INTRAVENOUS | Status: AC
  Administered 2021-07-17: 8 mg via INTRAVENOUS
  Filled 2021-07-17: qty 16

## 2021-07-17 MED ORDER — SODIUM CHLORIDE 0.9 % IV SOLN
Freq: Once | INTRAVENOUS | Status: AC
  Filled 2021-07-17: qty 250

## 2021-07-17 MED ORDER — SODIUM CHLORIDE 0.9 % IV SOLN
10.0000 mg | Freq: Once | INTRAVENOUS | Status: AC
  Administered 2021-07-17: 10 mg via INTRAVENOUS
  Filled 2021-07-17: qty 10

## 2021-07-17 NOTE — Patient Instructions (Signed)
[facility] ONCOLOGY  Discharge Instructions: Thank you for choosing [facility] to provide your oncology and hematology care.   If you have a lab appointment with the [facility], please go directly to the [facility] and check in at the registration area.   Wear comfortable clothing and clothing appropriate for easy access to any Portacath or PICC line.   We strive to give you quality time with your provider. You may need to reschedule your appointment if you arrive late (15 or more minutes).  Arriving late affects you and other patients whose appointments are after yours.  Also, if you miss three or more appointments without notifying the office, you may be dismissed from the clinic at the provider's discretion.      For prescription refill requests, have your pharmacy contact our office and allow 72 hours for refills to be completed.    Today you received the following chemotherapy and/or immunotherapy agents : Zepzelca     To help prevent nausea and vomiting after your treatment, we encourage you to take your nausea medication as directed.  BELOW ARE SYMPTOMS THAT SHOULD BE REPORTED IMMEDIATELY: *FEVER GREATER THAN 100.4 F (38 C) OR HIGHER *CHILLS OR SWEATING *NAUSEA AND VOMITING THAT IS NOT CONTROLLED WITH YOUR NAUSEA MEDICATION *UNUSUAL SHORTNESS OF BREATH *UNUSUAL BRUISING OR BLEEDING *URINARY PROBLEMS (pain or burning when urinating, or frequent urination) *BOWEL PROBLEMS (unusual diarrhea, constipation, pain near the anus) TENDERNESS IN MOUTH AND THROAT WITH OR WITHOUT PRESENCE OF ULCERS (sore throat, sores in mouth, or a toothache) UNUSUAL RASH, SWELLING OR PAIN  UNUSUAL VAGINAL DISCHARGE OR ITCHING   Items with * indicate a potential emergency and should be followed up as soon as possible or go to the Emergency Department if any problems should occur.  Please show the CHEMOTHERAPY ALERT CARD or IMMUNOTHERAPY ALERT CARD at check-in to  the Emergency Department and triage nurse.  Should you have questions after your visit or need to cancel or reschedule your appointment, please contact [facility]  Dept: [phone]  and follow the prompts.  Office hours are 8:00 a.m. to 4:30 p.m. Monday - Friday. Please note that voicemails left after 4:00 p.m. may not be returned until the following business day.  We are closed weekends and major holidays. You have access to a nurse at all times for urgent questions. Please call the main number to the clinic Dept: [phone] and follow the prompts.   For any non-urgent questions, you may also contact your provider using MyChart. We now offer e-Visits for anyone 60 and older to request care online for non-urgent symptoms. For details visit [email or web address].   Also download the MyChart app! Go to the app store, search "MyChart", open the app, select [facility], and log in with your MyChart username and password.  Due to Covid, a mask is required upon entering the hospital/clinic. If you do not have a mask, one will be given to you upon arrival. For doctor visits, patients may have 1 support person aged 44 or older with them. For treatment visits, patients cannot have anyone with them due to current Covid guidelines and our immunocompromised population.

## 2021-07-22 ENCOUNTER — Telehealth: Payer: Self-pay | Admitting: Physician Assistant

## 2021-07-22 NOTE — Telephone Encounter (Signed)
Scheduled per los. Called and left msg. Mailed printout  °

## 2021-07-24 ENCOUNTER — Inpatient Hospital Stay: Payer: Medicare Other

## 2021-07-31 ENCOUNTER — Other Ambulatory Visit: Payer: Medicare Other

## 2021-08-02 ENCOUNTER — Ambulatory Visit: Payer: Medicare Other | Admitting: Primary Care

## 2021-08-07 ENCOUNTER — Ambulatory Visit: Payer: Medicare Other

## 2021-08-07 ENCOUNTER — Ambulatory Visit: Payer: Medicare Other | Admitting: Internal Medicine

## 2021-08-07 ENCOUNTER — Other Ambulatory Visit: Payer: Medicare Other

## 2021-08-07 ENCOUNTER — Other Ambulatory Visit: Payer: Self-pay

## 2021-08-07 ENCOUNTER — Ambulatory Visit (HOSPITAL_COMMUNITY)
Admission: RE | Admit: 2021-08-07 | Discharge: 2021-08-07 | Disposition: A | Discharge status: Home / Self Care | Payer: Medicare Other | Source: Ambulatory Visit | Attending: Physician Assistant | Admitting: Physician Assistant

## 2021-08-07 DIAGNOSIS — C3490 Malignant neoplasm of unspecified part of unspecified bronchus or lung: Secondary | ICD-10-CM | POA: Diagnosis not present

## 2021-08-07 DIAGNOSIS — C3411 Malignant neoplasm of upper lobe, right bronchus or lung: Secondary | ICD-10-CM | POA: Insufficient documentation

## 2021-08-07 DIAGNOSIS — J90 Pleural effusion, not elsewhere classified: Secondary | ICD-10-CM | POA: Diagnosis not present

## 2021-08-07 DIAGNOSIS — R918 Other nonspecific abnormal finding of lung field: Secondary | ICD-10-CM | POA: Diagnosis not present

## 2021-08-07 DIAGNOSIS — K5730 Diverticulosis of large intestine without perforation or abscess without bleeding: Secondary | ICD-10-CM | POA: Diagnosis not present

## 2021-08-07 DIAGNOSIS — K769 Liver disease, unspecified: Secondary | ICD-10-CM | POA: Diagnosis not present

## 2021-08-07 DIAGNOSIS — I700 Atherosclerosis of aorta: Secondary | ICD-10-CM | POA: Diagnosis not present

## 2021-08-07 DIAGNOSIS — R911 Solitary pulmonary nodule: Secondary | ICD-10-CM | POA: Diagnosis not present

## 2021-08-07 DIAGNOSIS — N281 Cyst of kidney, acquired: Secondary | ICD-10-CM | POA: Diagnosis not present

## 2021-08-07 MED ORDER — HEPARIN SOD (PORK) LOCK FLUSH 100 UNIT/ML IV SOLN
500.0000 [IU] | Freq: Once | INTRAVENOUS | Status: AC
  Administered 2021-08-07: 500 [IU] via INTRAVENOUS

## 2021-08-07 MED ORDER — HEPARIN SOD (PORK) LOCK FLUSH 100 UNIT/ML IV SOLN
INTRAVENOUS | Status: AC
  Filled 2021-08-07: qty 5

## 2021-08-07 MED ORDER — IOHEXOL 350 MG/ML SOLN
100.0000 mL | Freq: Once | INTRAVENOUS | Status: AC | PRN
  Administered 2021-08-07: 100 mL via INTRAVENOUS

## 2021-08-08 ENCOUNTER — Inpatient Hospital Stay (HOSPITAL_BASED_OUTPATIENT_CLINIC_OR_DEPARTMENT_OTHER): Payer: Medicare Other | Admitting: Internal Medicine

## 2021-08-08 ENCOUNTER — Inpatient Hospital Stay: Payer: Medicare Other

## 2021-08-08 ENCOUNTER — Encounter: Payer: Self-pay | Admitting: Internal Medicine

## 2021-08-08 VITALS — BP 159/84 | HR 104 | Temp 97.2°F | Resp 20 | Ht 69.0 in | Wt 288.2 lb

## 2021-08-08 VITALS — HR 100

## 2021-08-08 DIAGNOSIS — R0609 Other forms of dyspnea: Secondary | ICD-10-CM | POA: Diagnosis not present

## 2021-08-08 DIAGNOSIS — J90 Pleural effusion, not elsewhere classified: Secondary | ICD-10-CM | POA: Diagnosis not present

## 2021-08-08 DIAGNOSIS — C787 Secondary malignant neoplasm of liver and intrahepatic bile duct: Secondary | ICD-10-CM | POA: Diagnosis not present

## 2021-08-08 DIAGNOSIS — C3411 Malignant neoplasm of upper lobe, right bronchus or lung: Secondary | ICD-10-CM

## 2021-08-08 DIAGNOSIS — Z5111 Encounter for antineoplastic chemotherapy: Secondary | ICD-10-CM | POA: Diagnosis not present

## 2021-08-08 DIAGNOSIS — R059 Cough, unspecified: Secondary | ICD-10-CM | POA: Diagnosis not present

## 2021-08-08 DIAGNOSIS — R0602 Shortness of breath: Secondary | ICD-10-CM | POA: Diagnosis not present

## 2021-08-08 DIAGNOSIS — Z95828 Presence of other vascular implants and grafts: Secondary | ICD-10-CM

## 2021-08-08 LAB — CBC WITH DIFFERENTIAL (CANCER CENTER ONLY)
Abs Immature Granulocytes: 0.05 10*3/uL (ref 0.00–0.07)
Basophils Absolute: 0 10*3/uL (ref 0.0–0.1)
Basophils Relative: 1 %
Eosinophils Absolute: 0.1 10*3/uL (ref 0.0–0.5)
Eosinophils Relative: 2 %
HCT: 39.8 % (ref 39.0–52.0)
Hemoglobin: 13 g/dL (ref 13.0–17.0)
Immature Granulocytes: 1 %
Lymphocytes Relative: 16 %
Lymphs Abs: 0.6 10*3/uL — ABNORMAL LOW (ref 0.7–4.0)
MCH: 29 pg (ref 26.0–34.0)
MCHC: 32.7 g/dL (ref 30.0–36.0)
MCV: 88.8 fL (ref 80.0–100.0)
Monocytes Absolute: 0.5 10*3/uL (ref 0.1–1.0)
Monocytes Relative: 13 %
Neutro Abs: 2.8 10*3/uL (ref 1.7–7.7)
Neutrophils Relative %: 67 %
Platelet Count: 216 10*3/uL (ref 150–400)
RBC: 4.48 MIL/uL (ref 4.22–5.81)
RDW: 15 % (ref 11.5–15.5)
WBC Count: 4.1 10*3/uL (ref 4.0–10.5)
nRBC: 0 % (ref 0.0–0.2)

## 2021-08-08 LAB — CMP (CANCER CENTER ONLY)
ALT: 22 U/L (ref 0–44)
AST: 19 U/L (ref 15–41)
Albumin: 3.7 g/dL (ref 3.5–5.0)
Alkaline Phosphatase: 71 U/L (ref 38–126)
Anion gap: 7 (ref 5–15)
BUN: 13 mg/dL (ref 8–23)
CO2: 26 mmol/L (ref 22–32)
Calcium: 9 mg/dL (ref 8.9–10.3)
Chloride: 105 mmol/L (ref 98–111)
Creatinine: 1.21 mg/dL (ref 0.61–1.24)
GFR, Estimated: >60 mL/min (ref >60)
Glucose, Bld: 203 mg/dL — ABNORMAL HIGH (ref 70–99)
Potassium: 4.1 mmol/L (ref 3.5–5.1)
Sodium: 138 mmol/L (ref 135–145)
Total Bilirubin: 0.5 mg/dL (ref 0.3–1.2)
Total Protein: 6.4 g/dL — ABNORMAL LOW (ref 6.5–8.1)

## 2021-08-08 MED ORDER — SODIUM CHLORIDE 0.9% FLUSH
10.0000 mL | INTRAVENOUS | Status: DC | PRN
  Administered 2021-08-08: 10 mL

## 2021-08-08 MED ORDER — HEPARIN SOD (PORK) LOCK FLUSH 100 UNIT/ML IV SOLN
500.0000 [IU] | Freq: Once | INTRAVENOUS | Status: AC | PRN
  Administered 2021-08-08: 500 [IU]

## 2021-08-08 MED ORDER — SODIUM CHLORIDE 0.9 % IV SOLN
3.1400 mg/m2 | Freq: Once | INTRAVENOUS | Status: AC
  Administered 2021-08-08: 8 mg via INTRAVENOUS
  Filled 2021-08-08: qty 16

## 2021-08-08 MED ORDER — SODIUM CHLORIDE 0.9% FLUSH
10.0000 mL | Freq: Once | INTRAVENOUS | Status: AC
  Administered 2021-08-08: 10 mL

## 2021-08-08 MED ORDER — SODIUM CHLORIDE 0.9 % IV SOLN: Freq: Once | INTRAVENOUS | Status: AC

## 2021-08-08 MED ORDER — PALONOSETRON HCL INJECTION 0.25 MG/5ML
0.2500 mg | Freq: Once | INTRAVENOUS | Status: AC
  Administered 2021-08-08: 0.25 mg via INTRAVENOUS
  Filled 2021-08-08: qty 5

## 2021-08-08 MED ORDER — SODIUM CHLORIDE 0.9 % IV SOLN
10.0000 mg | Freq: Once | INTRAVENOUS | Status: AC
  Administered 2021-08-08: 10 mg via INTRAVENOUS
  Filled 2021-08-08: qty 10

## 2021-08-08 NOTE — Patient Instructions (Signed)
[facility] ONCOLOGY  Discharge Instructions: Thank you for choosing [facility] to provide your oncology and hematology care.   If you have a lab appointment with the [facility], please go directly to the [facility] and check in at the registration area.   Wear comfortable clothing and clothing appropriate for easy access to any Portacath or PICC line.   We strive to give you quality time with your provider. You may need to reschedule your appointment if you arrive late (15 or more minutes).  Arriving late affects you and other patients whose appointments are after yours.  Also, if you miss three or more appointments without notifying the office, you may be dismissed from the clinic at the provider's discretion.      For prescription refill requests, have your pharmacy contact our office and allow 72 hours for refills to be completed.    Today you received the following chemotherapy and/or immunotherapy agents: Zepzelca      To help prevent nausea and vomiting after your treatment, we encourage you to take your nausea medication as directed.  BELOW ARE SYMPTOMS THAT SHOULD BE REPORTED IMMEDIATELY: *FEVER GREATER THAN 100.4 F (38 C) OR HIGHER *CHILLS OR SWEATING *NAUSEA AND VOMITING THAT IS NOT CONTROLLED WITH YOUR NAUSEA MEDICATION *UNUSUAL SHORTNESS OF BREATH *UNUSUAL BRUISING OR BLEEDING *URINARY PROBLEMS (pain or burning when urinating, or frequent urination) *BOWEL PROBLEMS (unusual diarrhea, constipation, pain near the anus) TENDERNESS IN MOUTH AND THROAT WITH OR WITHOUT PRESENCE OF ULCERS (sore throat, sores in mouth, or a toothache) UNUSUAL RASH, SWELLING OR PAIN  UNUSUAL VAGINAL DISCHARGE OR ITCHING   Items with * indicate a potential emergency and should be followed up as soon as possible or go to the Emergency Department if any problems should occur.  Please show the CHEMOTHERAPY ALERT CARD or IMMUNOTHERAPY ALERT CARD at check-in to  the Emergency Department and triage nurse.  Should you have questions after your visit or need to cancel or reschedule your appointment, please contact [facility]  Dept: [phone]  and follow the prompts.  Office hours are 8:00 a.m. to 4:30 p.m. Monday - Friday. Please note that voicemails left after 4:00 p.m. may not be returned until the following business day.  We are closed weekends and major holidays. You have access to a nurse at all times for urgent questions. Please call the main number to the clinic Dept: [phone] and follow the prompts.   For any non-urgent questions, you may also contact your provider using MyChart. We now offer e-Visits for anyone 30 and older to request care online for non-urgent symptoms. For details visit [email or web address].   Also download the MyChart app! Go to the app store, search "MyChart", open the app, select [facility], and log in with your MyChart username and password.  Due to Covid, a mask is required upon entering the hospital/clinic. If you do not have a mask, one will be given to you upon arrival. For doctor visits, patients may have 1 support person aged 76 or older with them. For treatment visits, patients cannot have anyone with them due to current Covid guidelines and our immunocompromised population.

## 2021-08-08 NOTE — Progress Notes (Signed)
[place] [phone]   [phone]  OFFICE PROGRESS NOTE  [name], NP [place] [address]  DIAGNOSIS: Relapsed extensive stage (T4, N3, M1c)  small cell lung cancer diagnosed in April 2021 and presented with large right upper lobe/right hilar mass with ipsilateral and contralateral mediastinal and right supraclavicular lymphadenopathy in addition to multiple liver lesios. He has disease progression after the first line of chemotherapy in December 2021.  PRIOR THERAPY:  1) Palliative radiotherapy to the right upper lobe lung mass under the care of Dr. Lisbeth Renshaw. 2) Systemic chemotherapy with carboplatin for AUC of 5 on day 1, etoposide 100 mg/M2 on days 1, 2 and 3 in addition to Imfinzi 1500 mg IV every 3 weeks with chemotherapy then every 4 weeks for maintenance if the patient has no evidence for progression.  He will also receive Cosela 240 mg/m2 on the days of the chemotherapy.  Status post 9 cycles.  Starting from cycle #5 the patient will be on maintenance treatment with immunotherapy with Imfinzi 1500 mg IV every 4 weeks. Last dose of chemotherapy was given on November 13, 2020. This treatment was discontinued secondary to disease progression. 3) Systemic chemotherapy with carboplatin for AUC of 5 on day 1, etoposide 100 mg/M2 on days 1, 2 and 3, Tecentriq 1200 mg IV every 3 weeks as well as Cosela 250 mg/M2 on the days of the chemotherapy every 3 weeks.  First dose December 18, 2020.  Status post 8 cycles.   CURRENT THERAPY: Zepzelca (lurbinectedin) 3.2 mgm2 IV every 3 weeks. First dose expected on 06/05/21. Status post 3 cycles.   INTERVAL HISTORY: Adam Wise 70 y.o. male returns to the clinic today for follow-up visit.  The patient is feeling fine today with no concerning complaints except for the baseline shortness of breath.  He had a trip to [place] recently and he enjoyed meeting friends and family.  He denied having any  current chest pain but has mild cough with no hemoptysis.  He denied having any fever or chills.  He has no nausea, vomiting, diarrhea or constipation.  He denied having any headache or visual changes.  He has no significant weight loss or night sweats.  He continues to tolerate his treatment with Zepzelca (lurbinectedin) fairly well.  The patient is here today for evaluation with repeat CT scan of the chest, abdomen pelvis for restaging of his disease.   MEDICAL HISTORY: Past Medical History:  Diagnosis Date   Cancer [facility])    Chickenpox    Chronic knee pain    Chronic low back pain    Essential hypertension    GERD (gastroesophageal reflux disease)    OSA (obstructive sleep apnea)    Testosterone deficiency    Type 2 diabetes mellitus (HCC)     ALLERGIES:  is allergic to bupropion.  MEDICATIONS:  Current Outpatient Medications  Medication Sig Dispense Refill   albuterol (VENTOLIN HFA) 108 (90 Base) MCG/ACT inhaler Inhale 2 puffs into the lungs every 6 (six) hours as needed for wheezing or shortness of breath. 18 g 0   amLODipine (NORVASC) 10 MG tablet TAKE 1 TABLET BY MOUTH EVERY DAY for blood pressure. 90 tablet 1   B-D 3CC LUER-LOK SYR 22GX1" 22G X 1" 3 ML MISC USE AS INSTRUCTED FOR TESTOSTERONE INJECTION EVERY 2 WEEKS 10 each 2   Black Pepper-Turmeric (TURMERIC COMPLEX/BLACK PEPPER PO) Take 1 tablet by mouth in the morning and at  bedtime.     blood glucose meter kit and supplies KIT Dispense based on patient and insurance preference. Use up to four times daily as directed. 1 each 0   chlorpheniramine-HYDROcodone (TUSSIONEX) 10-8 MG/5ML SUER Take 5 mLs by mouth at bedtime as needed for cough. 473 mL 0   Coenzyme Q10 ([employer] COQ-10) 200 MG capsule Take 200 mg by mouth every evening.      ELIQUIS 5 MG TABS tablet TAKE 1 TABLET BY MOUTH TWICE A DAY 180 tablet 2   esomeprazole (NEXIUM) 20 MG capsule Take 20 mg by mouth daily.      fluticasone (FLONASE) 50 MCG/ACT nasal spray Place 2 sprays  into both nostrils daily as needed for allergies.     gabapentin (NEURONTIN) 300 MG capsule Take 300 mg by mouth 2 (two) times daily.      glipiZIDE (GLUCOTROL) 10 MG tablet TAKE 1 TABLET (10 MG TOTAL) BY MOUTH 2 (TWO) TIMES DAILY BEFORE A MEAL. FOR DIABETES. 180 tablet 1   Insulin Glargine (BASAGLAR KWIKPEN) 100 UNIT/ML Inject 20 Units into the skin at bedtime. For diabetes. 15 mL 3   Insulin Pen Needle (BD PEN NEEDLE NANO U/F) 32G X 4 MM MISC Use with insulin as prescribed  Dx Code: E11.9 100 each 2   KRILL OIL PO Take 350 mg by mouth daily.     Lancets MISC USE UP TO 3 TIMES DAILY AS DIRECTED 100 each 2   lidocaine-prilocaine (EMLA) cream Apply 1 application topically as needed. 30 g 2   lisinopril (ZESTRIL) 5 MG tablet TAKE 1 TABLET BY MOUTH EVERY DAY for blood pressure. 90 tablet 1   Melatonin 10 MG CAPS Take 10 mg by mouth at bedtime.     metFORMIN (GLUCOPHAGE) 1000 MG tablet Take 1 tablet (1,000 mg total) by mouth 2 (two) times daily with a meal. For diabetes 180 tablet 0   Multiple Vitamin (MULTI-VITAMINS) TABS Take 1 tablet by mouth daily.      ONETOUCH ULTRA test strip USE UP TO 4 TIMES DAILY AS DIRECTED 100 strip 5   OVER THE COUNTER MEDICATION Take by mouth.     oxymetazoline (AFRIN) 0.05 % nasal spray Place 1 spray into both nostrils 2 (two) times daily as needed for congestion.     pioglitazone (ACTOS) 45 MG tablet Take 1 tablet (45 mg total) by mouth daily. For diabetes. 90 tablet 2   pravastatin (PRAVACHOL) 40 MG tablet TAKE 1 TABLET BY MOUTH EVERY DAY IN THE EVENING FOR CHOLESTEROL 90 tablet 1   prochlorperazine (COMPAZINE) 10 MG tablet Take 1 tablet (10 mg total) by mouth every 6 (six) hours as needed for nausea or vomiting. 30 tablet 0   sildenafil (REVATIO) 20 MG tablet Take 3-5 tablets by mouth 1 hour prior to intercourse as needed. 50 tablet 0   testosterone cypionate (DEPOTESTOSTERONE CYPIONATE) 200 MG/ML injection Inject 200 mg into the muscle every 14 (fourteen) days.      No current facility-administered medications for this visit.    SURGICAL HISTORY:  Past Surgical History:  Procedure Laterality Date   COLONOSCOPY WITH PROPOFOL N/A 12/17/2018   Procedure: COLONOSCOPY WITH PROPOFOL;  Surgeon: Virgel Manifold, MD;  Location: [facility] ENDOSCOPY;  Service: Gastroenterology;  Laterality: N/A;   IR IMAGING GUIDED PORT INSERTION  04/17/2020   JOINT REPLACEMENT Bilateral    REPLACEMENT TOTAL KNEE BILATERAL  2015   TONSILLECTOMY  1960    REVIEW OF SYSTEMS:  Constitutional: positive for fatigue Eyes: negative Ears, nose,  mouth, throat, and face: negative Respiratory: positive for dyspnea on exertion Cardiovascular: negative Gastrointestinal: negative Genitourinary:negative Integument/breast: negative Hematologic/lymphatic: negative Musculoskeletal:negative Neurological: negative Behavioral/Psych: negative Endocrine: negative Allergic/Immunologic: negative   PHYSICAL EXAMINATION: General appearance: alert, cooperative, fatigued, and no distress Head: atraumatic Neck: no adenopathy, no JVD, supple, symmetrical, trachea midline, and thyroid not enlarged, symmetric, no tenderness/mass/nodules Lymph nodes: Cervical, supraclavicular, and axillary nodes normal. Resp: diminished breath sounds RLL and dullness to percussion RLL Back: symmetric, no curvature. ROM normal. No CVA tenderness. Cardio: regular rate and rhythm, S1, S2 normal, no murmur, click, rub or gallop GI: soft, non-tender; bowel sounds normal; no masses,  no organomegaly Extremities: extremities normal, atraumatic, no cyanosis or edema Neurologic: Alert and oriented X 3, normal strength and tone. Normal symmetric reflexes. Normal coordination and gait  ECOG PERFORMANCE STATUS: 1 - Symptomatic but completely ambulatory  Blood pressure (!) 159/84, pulse (!) 104, temperature (!) 97.2 F (36.2 C), temperature source Tympanic, resp. rate 20, height $RemoveBe'5\' 9"'KRtsFFldo$  (1.753 m), weight 288 lb 3.2 oz (130.7  kg), SpO2 96 %.  LABORATORY DATA: Lab Results  Component Value Date   WBC 4.2 07/17/2021   HGB 13.2 07/17/2021   HCT 40.6 07/17/2021   MCV 89.6 07/17/2021   PLT 230 07/17/2021      Chemistry      Component Value Date/Time   NA 143 07/17/2021 1413   K 4.6 07/17/2021 1413   CL 107 07/17/2021 1413   CO2 26 07/17/2021 1413   BUN 11 07/17/2021 1413   CREATININE 1.27 (H) 07/17/2021 1413      Component Value Date/Time   CALCIUM 9.3 07/17/2021 1413   ALKPHOS 66 07/17/2021 1413   AST 26 07/17/2021 1413   ALT 27 07/17/2021 1413   BILITOT 0.4 07/17/2021 1413       RADIOGRAPHIC STUDIES: CT Chest W Contrast  Result Date: 08/08/2021 CLINICAL DATA:  Metastatic small-cell lung cancer, status post radiation therapy, ongoing chemotherapy EXAM: CT CHEST, ABDOMEN, AND PELVIS WITH CONTRAST TECHNIQUE: Multidetector CT imaging of the chest, abdomen and pelvis was performed following the standard protocol during bolus administration of intravenous contrast. CONTRAST:  178mL OMNIPAQUE IOHEXOL 350 MG/ML SOLN, additional oral enteric contrast COMPARISON:  05/16/2021 FINDINGS: CT CHEST FINDINGS Cardiovascular: Right chest port catheter. Aortic atherosclerosis. Normal heart size. Three-vessel coronary artery calcifications. No pericardial effusion. Mediastinum/Nodes: Unchanged post treatment appearance of right hilar soft tissue (series 2, image 23). No discretely enlarged mediastinal, hilar, or axillary lymph nodes. Thyroid gland, trachea, and esophagus demonstrate no significant findings. Lungs/Pleura: Unchanged post treatment appearance of the right lung with perihilar and suprahilar consolidation and fibrosis. Unchanged small to moderate right pleural effusion. Occasional tiny pulmonary nodules are unchanged, for example a 2 mm nodule of the left upper lobe (series 4, image 48). Musculoskeletal: No chest wall mass or suspicious bone lesions identified. CT ABDOMEN PELVIS FINDINGS Hepatobiliary: Multiple  hypodense liver lesions are decreased in size, for example a lesion in the anterior midline liver measuring 1.3 x 0.7 cm, previously 2.5 x 1.6 cm (series 2, image 49) and in the anterior inferior right lobe of the liver, hepatic segment VI, measuring 2.0 x 1.4 cm, previously 4.0 x 2.3 cm (series 2, image 60). No gallstones, gallbladder wall thickening, or biliary dilatation. Pancreas: Unremarkable. No pancreatic ductal dilatation or surrounding inflammatory changes. Spleen: Normal in size without significant abnormality. Adrenals/Urinary Tract: Unchanged small nodule of the right adrenal gland, measuring 1.7 cm (series 2, image 57). Multiple benign right renal cysts. Kidneys are otherwise  normal, without renal calculi, solid lesion, or hydronephrosis. Air-fluid level in the urinary bladder. Stomach/Bowel: Stomach is within normal limits. Appendix appears normal. No evidence of bowel wall thickening, distention, or inflammatory changes. Sigmoid diverticulosis. Vascular/Lymphatic: Aortic atherosclerosis. No enlarged abdominal or pelvic lymph nodes. Reproductive: Prostatomegaly. Other: No abdominal wall hernia or abnormality. No abdominopelvic ascites. Musculoskeletal: Chronic bilateral pars defects of L5. IMPRESSION: 1. Unchanged post treatment appearance of the right lung with perihilar and suprahilar consolidation and fibrosis. Unchanged small to moderate right pleural effusion. 2. Multiple hypodense liver lesions are decreased in size, consistent with treatment response of hepatic metastatic disease. 3. Occasional tiny pulmonary nodules are unchanged, likely benign and incidental. Attention on follow-up. 4. Unchanged small nodule of the right adrenal gland, measuring 1.7 cm, which remains nonspecific, although likely an incidental small adenoma. Attention on follow-up. 5. Prostatomegaly. 6. Air-fluid level in the urinary bladder, likely due to recent catheterization. 7. Coronary artery disease. Aortic  Atherosclerosis (ICD10-I70.0). Electronically Signed   By: Eddie Candle M.D.   On: 08/08/2021 08:59   CT Abdomen Pelvis W Contrast  Result Date: 08/08/2021 CLINICAL DATA:  Metastatic small-cell lung cancer, status post radiation therapy, ongoing chemotherapy EXAM: CT CHEST, ABDOMEN, AND PELVIS WITH CONTRAST TECHNIQUE: Multidetector CT imaging of the chest, abdomen and pelvis was performed following the standard protocol during bolus administration of intravenous contrast. CONTRAST:  150mL OMNIPAQUE IOHEXOL 350 MG/ML SOLN, additional oral enteric contrast COMPARISON:  05/16/2021 FINDINGS: CT CHEST FINDINGS Cardiovascular: Right chest port catheter. Aortic atherosclerosis. Normal heart size. Three-vessel coronary artery calcifications. No pericardial effusion. Mediastinum/Nodes: Unchanged post treatment appearance of right hilar soft tissue (series 2, image 23). No discretely enlarged mediastinal, hilar, or axillary lymph nodes. Thyroid gland, trachea, and esophagus demonstrate no significant findings. Lungs/Pleura: Unchanged post treatment appearance of the right lung with perihilar and suprahilar consolidation and fibrosis. Unchanged small to moderate right pleural effusion. Occasional tiny pulmonary nodules are unchanged, for example a 2 mm nodule of the left upper lobe (series 4, image 48). Musculoskeletal: No chest wall mass or suspicious bone lesions identified. CT ABDOMEN PELVIS FINDINGS Hepatobiliary: Multiple hypodense liver lesions are decreased in size, for example a lesion in the anterior midline liver measuring 1.3 x 0.7 cm, previously 2.5 x 1.6 cm (series 2, image 49) and in the anterior inferior right lobe of the liver, hepatic segment VI, measuring 2.0 x 1.4 cm, previously 4.0 x 2.3 cm (series 2, image 60). No gallstones, gallbladder wall thickening, or biliary dilatation. Pancreas: Unremarkable. No pancreatic ductal dilatation or surrounding inflammatory changes. Spleen: Normal in size without  significant abnormality. Adrenals/Urinary Tract: Unchanged small nodule of the right adrenal gland, measuring 1.7 cm (series 2, image 57). Multiple benign right renal cysts. Kidneys are otherwise normal, without renal calculi, solid lesion, or hydronephrosis. Air-fluid level in the urinary bladder. Stomach/Bowel: Stomach is within normal limits. Appendix appears normal. No evidence of bowel wall thickening, distention, or inflammatory changes. Sigmoid diverticulosis. Vascular/Lymphatic: Aortic atherosclerosis. No enlarged abdominal or pelvic lymph nodes. Reproductive: Prostatomegaly. Other: No abdominal wall hernia or abnormality. No abdominopelvic ascites. Musculoskeletal: Chronic bilateral pars defects of L5. IMPRESSION: 1. Unchanged post treatment appearance of the right lung with perihilar and suprahilar consolidation and fibrosis. Unchanged small to moderate right pleural effusion. 2. Multiple hypodense liver lesions are decreased in size, consistent with treatment response of hepatic metastatic disease. 3. Occasional tiny pulmonary nodules are unchanged, likely benign and incidental. Attention on follow-up. 4. Unchanged small nodule of the right adrenal gland, measuring 1.7 cm,  which remains nonspecific, although likely an incidental small adenoma. Attention on follow-up. 5. Prostatomegaly. 6. Air-fluid level in the urinary bladder, likely due to recent catheterization. 7. Coronary artery disease. Aortic Atherosclerosis (ICD10-I70.0). Electronically Signed   By: Eddie Candle M.D.   On: 08/08/2021 08:59    ASSESSMENT AND PLAN: This is a very pleasant 70 years old white male recently diagnosed with extensive stage (T4, N3, M1 C) small cell lung cancer presented with large right upper lobe lung mass in addition to mediastinal and right supraclavicular lymphadenopathy and multiple metastatic liver lesions diagnosed in April 2021. The patient underwent systemic chemotherapy with carboplatin for AUC of 5 on day  1, etoposide 100 mg/M2 on days 1, 2 and 3 in addition to Cosela for myeloprotection during the days of the chemotherapy.  He will also receive immunotherapy with Imfinzi on day one of the chemotherapy cycle.  Status post 9 cycles.  Starting from cycle #5 he is on maintenance treatment with Imfinzi 1500 mg IV every 4 weeks. The patient has been tolerating this treatment well with no concerning adverse effect except for the worsening cough and shortness of breath recently.   He had repeat CT scan of the chest, abdomen pelvis performed recently.  I personally and independently reviewed the scan images and discussed the results with the patient and his wife today. Unfortunately his scan showed evidence for disease progression with increase in the right pleural effusion as well as enlargement of multiple liver lesions. I recommended for the patient to discontinue his current maintenance treatment with Imfinzi at this point. He resumed systemic chemotherapy with carboplatin for AUC of 5 on day 1, etoposide 100 mg/M2 on days 1, 2 and 3 with Tecentriq 1200 mg every 3 weeks as well as Cosela on the days of the chemotherapy.  He is status post 8 cycles.  This treatment was discontinued secondary to disease progression. The patient is currently undergoing third line treatment with systemic chemotherapy with Zepzelca (lurbinectedin) 3.2 Mg/KG every 3 weeks status post 3 cycles.  The patient continues to tolerate his treatment fairly well with no concerning adverse effects. He had repeat CT scan of the chest, abdomen pelvis performed recently.  I personally and independently reviewed the scan images and discussed the results with the patient and his wife. Has a scan showed stable disease in the chest but there was improvement in the liver lesions. I recommended for the patient to continue his current treatment with Zepzelca (lurbinectedin) with the same dose.  He will proceed with cycle #4 today. I will see the  patient back for follow-up visit in 3 weeks for evaluation before starting cycle #5. For the recurrent right pleural effusion, I will arrange for the patient to have ultrasound-guided right thoracentesis. He was advised to call immediately if he has any other concerning symptoms in the interval. The patient voices understanding of current disease status and treatment options and is in agreement with the current care plan.  All questions were answered. The patient knows to call the clinic with any problems, questions or concerns. We can certainly see the patient much sooner if necessary.  Disclaimer: This note was dictated with voice recognition software. Similar sounding words can inadvertently be transcribed and may not be corrected upon review.

## 2021-08-08 NOTE — Progress Notes (Signed)
Per Dr Julien Nordmann it is ok to treat pt today with  Lubinectedin and heart rate of 104.

## 2021-08-12 ENCOUNTER — Ambulatory Visit (HOSPITAL_COMMUNITY)
Admission: RE | Admit: 2021-08-12 | Discharge: 2021-08-12 | Disposition: A | Discharge status: Home / Self Care | Payer: Medicare Other | Source: Ambulatory Visit | Attending: Radiology | Admitting: Radiology

## 2021-08-12 ENCOUNTER — Ambulatory Visit (HOSPITAL_COMMUNITY)
Admission: RE | Admit: 2021-08-12 | Discharge: 2021-08-12 | Disposition: A | Discharge status: Home / Self Care | Payer: Medicare Other | Source: Ambulatory Visit | Attending: Internal Medicine | Admitting: Internal Medicine

## 2021-08-12 ENCOUNTER — Ambulatory Visit (HOSPITAL_COMMUNITY): Admission: RE | Admit: 2021-08-12 | Payer: Medicare Other | Source: Ambulatory Visit

## 2021-08-12 ENCOUNTER — Other Ambulatory Visit: Payer: Self-pay

## 2021-08-12 DIAGNOSIS — Z9889 Other specified postprocedural states: Secondary | ICD-10-CM

## 2021-08-12 DIAGNOSIS — C3411 Malignant neoplasm of upper lobe, right bronchus or lung: Secondary | ICD-10-CM | POA: Diagnosis not present

## 2021-08-12 DIAGNOSIS — J90 Pleural effusion, not elsewhere classified: Secondary | ICD-10-CM | POA: Diagnosis not present

## 2021-08-12 DIAGNOSIS — R918 Other nonspecific abnormal finding of lung field: Secondary | ICD-10-CM | POA: Diagnosis not present

## 2021-08-12 DIAGNOSIS — I517 Cardiomegaly: Secondary | ICD-10-CM | POA: Diagnosis not present

## 2021-08-12 MED ORDER — LIDOCAINE HCL 1 % IJ SOLN
INTRAMUSCULAR | Status: AC
  Filled 2021-08-12: qty 20

## 2021-08-12 NOTE — Procedures (Signed)
Ultrasound-guided therapeutic right sided thoracentesis performed yielding 620 mililiters of amber colored fluid. No immediate complications.  Follow-up chest x-ray pending. EBL is <2 ml.

## 2021-08-13 ENCOUNTER — Encounter: Payer: Self-pay | Admitting: Primary Care

## 2021-08-13 ENCOUNTER — Ambulatory Visit (INDEPENDENT_AMBULATORY_CARE_PROVIDER_SITE_OTHER): Payer: Medicare Other | Admitting: Primary Care

## 2021-08-13 VITALS — BP 144/80 | HR 61 | Temp 97.7°F | Ht 69.0 in | Wt 289.0 lb

## 2021-08-13 DIAGNOSIS — E349 Endocrine disorder, unspecified: Secondary | ICD-10-CM

## 2021-08-13 DIAGNOSIS — G8929 Other chronic pain: Secondary | ICD-10-CM | POA: Diagnosis not present

## 2021-08-13 DIAGNOSIS — J90 Pleural effusion, not elsewhere classified: Secondary | ICD-10-CM | POA: Diagnosis not present

## 2021-08-13 DIAGNOSIS — C3411 Malignant neoplasm of upper lobe, right bronchus or lung: Secondary | ICD-10-CM | POA: Diagnosis not present

## 2021-08-13 DIAGNOSIS — M545 Low back pain, unspecified: Secondary | ICD-10-CM | POA: Diagnosis not present

## 2021-08-13 DIAGNOSIS — E119 Type 2 diabetes mellitus without complications: Secondary | ICD-10-CM

## 2021-08-13 DIAGNOSIS — Z125 Encounter for screening for malignant neoplasm of prostate: Secondary | ICD-10-CM

## 2021-08-13 DIAGNOSIS — I10 Essential (primary) hypertension: Secondary | ICD-10-CM | POA: Diagnosis not present

## 2021-08-13 DIAGNOSIS — Z86711 Personal history of pulmonary embolism: Secondary | ICD-10-CM | POA: Diagnosis not present

## 2021-08-13 DIAGNOSIS — N529 Male erectile dysfunction, unspecified: Secondary | ICD-10-CM | POA: Diagnosis not present

## 2021-08-13 DIAGNOSIS — E785 Hyperlipidemia, unspecified: Secondary | ICD-10-CM | POA: Diagnosis not present

## 2021-08-13 DIAGNOSIS — Z23 Encounter for immunization: Secondary | ICD-10-CM

## 2021-08-13 LAB — LIPID PANEL
Cholesterol: 131 mg/dL (ref 0–200)
HDL: 44.3 mg/dL (ref >39.00)
NonHDL: 86.83
Total CHOL/HDL Ratio: 3
Triglycerides: 209 mg/dL — ABNORMAL HIGH (ref 0.0–149.0)
VLDL: 41.8 mg/dL — ABNORMAL HIGH (ref 0.0–40.0)

## 2021-08-13 LAB — PSA, MEDICARE: PSA: 1.78 ng/ml (ref 0.10–4.00)

## 2021-08-13 LAB — LDL CHOLESTEROL, DIRECT: Direct LDL: 66 mg/dL

## 2021-08-13 LAB — HEMOGLOBIN A1C: Hgb A1c MFr Bld: 8.1 % — ABNORMAL HIGH (ref 4.6–6.5)

## 2021-08-13 MED ORDER — LOSARTAN POTASSIUM 25 MG PO TABS: 25.0000 mg | ORAL_TABLET | Freq: Every day | ORAL | 0 refills | Status: DC

## 2021-08-13 NOTE — Assessment & Plan Note (Signed)
Following with oncology, most recent office visit and labs reviewed.   Continue chemotherapy, eliquis 5 mg BID.

## 2021-08-13 NOTE — Assessment & Plan Note (Signed)
Compliant to pravastatin 40 mg, continue same. Repeat lipids pending.

## 2021-08-13 NOTE — Assessment & Plan Note (Signed)
Following with Urology, continue testosterone injection.

## 2021-08-13 NOTE — Assessment & Plan Note (Addendum)
No recent A1C on file, repeat A1C pending today. No longer injecting Basaglar, only prior to and just after chemotherapy/steroid treatment.  Glucose readings appear stable. Continue Actos 45 mg, glipizide 10 mg BID, metformin 1000 mg BID.  Managed on ACE, will switch to ARB.  Managed on statin. Foot exam today.  Eye exam due, he will schedule.   Follow up in 6 months

## 2021-08-13 NOTE — Assessment & Plan Note (Signed)
Compliant to Eliquis 5 mg BID, continue same.

## 2021-08-13 NOTE — Progress Notes (Signed)
Subjective:    Patient ID: Adam Wise, male    DOB: [birth year], 70 y.o.   MRN: [id number]  HPI  Adam Wise is a very pleasant 70 y.o. male with a history of hypertension, OSA, small cell lung cancer, type 2 diabetes, testosterone deficiency, chronic fatigue who presents today for follow up of chronic conditions.  1) Type 2 Diabetes:  Current medications include: Actos 45 mg, Glipizide 10 mg BID, Metformin 1000 mg BID, Basaglar 20 units HS.  He stopped his insulin months ago. He will typically inject only prior to chemotherapy as it includes a steroid prep.   He is checking his blood glucose 1-2 times daily and is getting readings of: 130's mostly.  Last A1C: 7.5 in February 2022. Last Eye Exam: Due Last Foot Exam: Due Pneumonia Vaccination: 2019 Urine Microalbumin: None. 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 Following with oncology, last visit was 08/08/21. Completed chemotherapy and radiation in December 2021, resumed chemotherapy in June 2022 due to return of disease.   He underwent CT chest/abdomen/pelvis recently which showed evidence for disease progression with several liver lesions and right pleural effusion. He underwent right thoracentesis yesterday for pleural effusion.   Managed on albuterol PRN, Eliquis 5 mg BID, compazine 10 mg.   3) Essential Hypertension: Currently managed on amlodipine 10 mg, lisinopril 5 mg. He is not checking BP at home, he does have a BP cuff. He does not wish to increase his lisinopril due to fear of cough. He could not tolerate HCTZ in the past due to lower extremity cramping.   BP Readings from Last 3 Encounters:  08/13/21 (!) 144/80  08/12/21 (!) 149/89  08/08/21 (!) 159/84   4) Chronic Back Pain: Currently managed on gabapentin 300 mg BID, is slowly weaning down and plans to  decrease to once daily. He does struggle with low back pain at night. He's not sure if gabapentin is helping.    Review of Systems  Respiratory:  Positive for shortness of breath.   Cardiovascular:  Negative for chest pain.  Musculoskeletal:  Positive for arthralgias and back pain.  Neurological:  Negative for headaches.        Past Medical History:  Diagnosis Date   Cancer ([place])    Chickenpox    Chronic knee pain    Chronic low back pain    Essential hypertension    GERD (gastroesophageal reflux disease)    OSA (obstructive sleep apnea)    Testosterone deficiency    Type 2 diabetes mellitus ([place])     Social History   Socioeconomic History   Marital status: Married    Spouse name: Not on file   Number of children: Not on file   Years of education: Not on file   Highest education level: Not on file  Occupational History   Not on file  Tobacco Use   Smoking status: Former   Smokeless tobacco: Never  Vaping Use   Vaping Use: Never used  Substance and Sexual Activity   Alcohol use: Yes    Comment: rarely   Drug use: Not Currently   Sexual activity: Yes  Other Topics Concern   Not on file  Social History Narrative   Married.   Moved from [place].   Retired.   Social Determinants of Health   Financial Resource Strain: Low Risk  Difficulty of Paying Living Expenses: Not hard at all  Food Insecurity: No Food Insecurity   Worried About Hope in the Last Year: Never true   Ran Out of Food in the Last Year: Never true  Transportation Needs: No Transportation Needs   Lack of Transportation (Medical): No   Lack of Transportation (Non-Medical): No  Physical Activity: Inactive   Days of Exercise per Week: 0 days   Minutes of Exercise per Session: 0 min  Stress: No Stress Concern Present   Feeling of Stress : Not at all  Social Connections: Not on file  Intimate Partner Violence: Not At Risk   Fear of Current or Ex-Partner: No   Emotionally Abused: No    Physically Abused: No   Sexually Abused: No    Past Surgical History:  Procedure Laterality Date   COLONOSCOPY WITH PROPOFOL N/A 12/17/2018   Procedure: COLONOSCOPY WITH PROPOFOL;  Surgeon: Virgel Manifold, MD;  Location: [facility] ENDOSCOPY;  Service: Gastroenterology;  Laterality: N/A;   IR IMAGING GUIDED PORT INSERTION  04/17/2020   JOINT REPLACEMENT Bilateral    REPLACEMENT TOTAL KNEE BILATERAL  2015   TONSILLECTOMY  1960    Family History  Problem Relation Age of Onset   Cancer Mother    Hypertension Mother    Arthritis Father    Asthma Father    Cancer Father    COPD Father    Heart attack Father    Hypertension Sister    Cancer Sister    Diabetes Sister    Asthma Son    Birth defects Maternal Grandfather    Arthritis Paternal Grandmother    Diabetes Paternal Grandmother    Arthritis Paternal Grandfather    Asthma Sister    Cancer Sister    COPD Sister    Arthritis Sister    Asthma Sister    Diabetes Sister    Other Neg Hx        pituitary disorder    Allergies  Allergen Reactions   Bupropion     Racing heart   Hydrochlorothiazide Other (See Comments)    Cramping to lower extremities    Current Outpatient Medications on File Prior to Visit  Medication Sig Dispense Refill   albuterol (VENTOLIN HFA) 108 (90 Base) MCG/ACT inhaler Inhale 2 puffs into the lungs every 6 (six) hours as needed for wheezing or shortness of breath. 18 g 0   amLODipine (NORVASC) 10 MG tablet TAKE 1 TABLET BY MOUTH EVERY DAY for blood pressure. 90 tablet 1   B-D 3CC LUER-LOK SYR 22GX1" 22G X 1" 3 ML MISC USE AS INSTRUCTED FOR TESTOSTERONE INJECTION EVERY 2 WEEKS 10 each 2   Black Pepper-Turmeric (TURMERIC COMPLEX/BLACK PEPPER PO) Take 1 tablet by mouth in the morning and at bedtime.     blood glucose meter kit and supplies KIT Dispense based on patient and insurance preference. Use up to four times daily as directed. 1 each 0   chlorpheniramine-HYDROcodone (TUSSIONEX) 10-8 MG/5ML SUER  Take 5 mLs by mouth at bedtime as needed for cough. 473 mL 0   Coenzyme Q10 ([employer] COQ-10) 200 MG capsule Take 200 mg by mouth every evening.      ELIQUIS 5 MG TABS tablet TAKE 1 TABLET BY MOUTH TWICE A DAY 180 tablet 2   esomeprazole (NEXIUM) 20 MG capsule Take 20 mg by mouth daily.      fluticasone (FLONASE) 50 MCG/ACT nasal spray Place 2 sprays into both nostrils daily as needed for  allergies.     gabapentin (NEURONTIN) 300 MG capsule Take 300 mg by mouth 2 (two) times daily.      glipiZIDE (GLUCOTROL) 10 MG tablet TAKE 1 TABLET (10 MG TOTAL) BY MOUTH 2 (TWO) TIMES DAILY BEFORE A MEAL. FOR DIABETES. 180 tablet 1   Insulin Pen Needle (BD PEN NEEDLE NANO U/F) 32G X 4 MM MISC Use with insulin as prescribed  Dx Code: E11.9 100 each 2   KRILL OIL PO Take 350 mg by mouth daily.     Lancets MISC USE UP TO 3 TIMES DAILY AS DIRECTED 100 each 2   lidocaine-prilocaine (EMLA) cream Apply 1 application topically as needed. 30 g 2   Melatonin 10 MG CAPS Take 10 mg by mouth at bedtime.     metFORMIN (GLUCOPHAGE) 1000 MG tablet Take 1 tablet (1,000 mg total) by mouth 2 (two) times daily with a meal. For diabetes 180 tablet 0   Multiple Vitamin (MULTI-VITAMINS) TABS Take 1 tablet by mouth daily.      ONETOUCH ULTRA test strip USE UP TO 4 TIMES DAILY AS DIRECTED 100 strip 5   OVER THE COUNTER MEDICATION Take by mouth.     oxymetazoline (AFRIN) 0.05 % nasal spray Place 1 spray into both nostrils 2 (two) times daily as needed for congestion.     pioglitazone (ACTOS) 45 MG tablet Take 1 tablet (45 mg total) by mouth daily. For diabetes. 90 tablet 2   pravastatin (PRAVACHOL) 40 MG tablet TAKE 1 TABLET BY MOUTH EVERY DAY IN THE EVENING FOR CHOLESTEROL 90 tablet 1   prochlorperazine (COMPAZINE) 10 MG tablet Take 1 tablet (10 mg total) by mouth every 6 (six) hours as needed for nausea or vomiting. 30 tablet 0   sildenafil (REVATIO) 20 MG tablet Take 3-5 tablets by mouth 1 hour prior to intercourse as needed. 50 tablet  0   testosterone cypionate (DEPOTESTOSTERONE CYPIONATE) 200 MG/ML injection Inject 200 mg into the muscle every 14 (fourteen) days.     Insulin Glargine (BASAGLAR KWIKPEN) 100 UNIT/ML Inject 20 Units into the skin at bedtime. For diabetes. (Patient not taking: Reported on 08/13/2021) 15 mL 3   No current facility-administered medications on file prior to visit.    BP (!) 144/80   Pulse 61   Temp 97.7 F (36.5 C) (Temporal)   Ht $R'5\' 9"'Is$  (1.753 m)   Wt 289 lb (131.1 kg)   SpO2 97%   BMI 42.68 kg/m  Objective:   Physical Exam Cardiovascular:     Rate and Rhythm: Normal rate and regular rhythm.  Pulmonary:     Effort: Pulmonary effort is normal.     Breath sounds: No rhonchi.  Musculoskeletal:     Cervical back: Neck supple.  Skin:    General: Skin is warm and dry.  Neurological:     Mental Status: He is alert and oriented to person, place, and time.  Psychiatric:        Mood and Affect: Mood normal.          Assessment & Plan:      This visit occurred during the SARS-CoV-2 public health emergency.  Safety protocols were in place, including screening questions prior to the visit, additional usage of staff PPE, and extensive cleaning of exam room while observing appropriate contact time as indicated for disinfecting solutions.

## 2021-08-13 NOTE — Assessment & Plan Note (Signed)
Continues to take gabapentin 300 mg BID, is considering weaning down further as he's not sure if this is effective. He will update.

## 2021-08-13 NOTE — Assessment & Plan Note (Signed)
Doing well on sildenafil 20 mg PRN, continue same.

## 2021-08-13 NOTE — Assessment & Plan Note (Signed)
Above goal in the office today, also with other provider visits, he's also NOT checking BP at home.  Stop lisinopril 5 mg. Start losartan 25 mg.  Continue amlodipine 10 mg.  He will monitor BP at home and report readings in 2 weeks. Consider increasing losartan to 50 mg if above goal of 140/90.

## 2021-08-13 NOTE — Assessment & Plan Note (Signed)
S/p thoracentesis, notes reviewed. Lungs today with slight diminish to right lower field.  Appears stable.

## 2021-08-13 NOTE — Patient Instructions (Addendum)
Stop by the lab prior to leaving today. I will notify you of your results once received.   Stop taking lisinopril 5 mg for blood pressure.  Start taking losartan 25 mg once daily for blood pressure.  Monitor your blood pressure which should run less than 140 on top and less than 90 on bottom.  Please schedule a follow up visit for 6 months.  It was a pleasure to see you today!      Influenza (Flu) Vaccine (Inactivated or Recombinant): What You Need to Know 1. Why get vaccinated? Influenza vaccine can prevent influenza (flu). Flu is a contagious disease that spreads around the [place] every year, usually between October and May. Anyone can get the flu, but it is more dangerous for some people. Infants and young children, people 83 years and older, pregnant people, and people with certain health conditions or a weakenedimmune system are at greatest risk of flu complications. Pneumonia, bronchitis, sinus infections, and ear infections are examples of flu-related complications. If you have a medical condition, such as heartdisease, cancer, or diabetes, flu can make it worse. Flu can cause fever and chills, sore throat, muscle aches, fatigue, cough, headache, and runny or stuffy nose. Some people may have vomiting and diarrhea,though this is more common in children than adults. In an average year, thousands of people in the [place] die from flu, and many more are hospitalized. Flu vaccine prevents millions of illnessesand flu-related visits to the doctor each year. 2. Influenza vaccines CDC recommends everyone 6 months and older get vaccinated every flu season. Children 6 months through 45 years of age may need 2 doses during a single flu season. Everyone else needs only 1 dose each flu season. It takes about 2 weeks for protection to develop after vaccination. There are many flu viruses, and they are always changing. Each year a new flu vaccine is made to protect against the  influenza viruses believed to be likely to cause disease in the upcoming flu season. Even when the vaccine doesn'texactly match these viruses, it may still provide some protection. Influenza vaccine does not cause flu. Influenza vaccine may be given at the same time as other vaccines. 3. Talk with your health care provider Tell your vaccination provider if the person getting the vaccine: Has had an allergic reaction after a previous dose of influenza vaccine, or has any severe, life-threatening allergies Has ever had Guillain-Barr Syndrome (also called "GBS") In some cases, your health care provider may decide to postpone influenzavaccination until a future visit. Influenza vaccine can be administered at any time during pregnancy. People who are or will be pregnant during influenza season should receive inactivatedinfluenza vaccine. People with minor illnesses, such as a cold, may be vaccinated. People who are moderately or severely ill should usually wait until they recover beforegetting influenza vaccine. Your health care provider can give you more information. 4. Risks of a vaccine reaction Soreness, redness, and swelling where the shot is given, fever, muscle aches, and headache can happen after influenza vaccination. There may be a very small increased risk of Guillain-Barr Syndrome (GBS) after inactivated influenza vaccine (the flu shot). Young children who get the flu shot along with pneumococcal vaccine (PCV13) and/or DTaP vaccine at the same time might be slightly more likely to have a seizure caused by fever. Tell your health care provider if a child who isgetting flu vaccine has ever had a seizure. People sometimes faint after medical procedures, including vaccination. Tellyour provider if you feel  dizzy or have vision changes or ringing in the ears. As with any medicine, there is a very remote chance of a vaccine causing asevere allergic reaction, other serious injury, or death. 5.  What if there is a serious problem? An allergic reaction could occur after the vaccinated person leaves the clinic. If you see signs of a severe allergic reaction (hives, swelling of the face and throat, difficulty breathing, a fast heartbeat, dizziness, or weakness), call 9-1-1 and get the person to the nearest hospital. For other signs that concern you, call your health care provider. Adverse reactions should be reported to the Vaccine Adverse Event Reporting System (VAERS). Your health care provider will usually file this report, or you can do it yourself. Visit the VAERS website at www.vaers.SamedayNews.es or call 620 097 2939. VAERS is only for reporting reactions, and VAERS staff members do not give medical advice. 6. The National Vaccine Injury Compensation Program The [employer] Vaccine Injury Compensation Program (VICP) is a federal program that was created to compensate people who may have been injured by certain vaccines. Claims regarding alleged injury or death due to vaccination have a time limit for filing, which may be as short as two years. Visit the VICP website at GoldCloset.com.ee or call 601 614 3954 to learn about the program and about filing a claim. 7. How can I learn more? Ask your health care provider. Call your local or state health department. Visit the website of the Food and Drug Administration (FDA) for vaccine package inserts and additional information at TraderRating.uy. Contact the Centers for Disease Control and Prevention (CDC): Call (209) 881-4481 (1-800-CDC-INFO) or Visit CDC's website at https://gibson.com/. Vaccine Information Statement Inactivated Influenza Vaccine (07/20/2020) This information is not intended to replace advice given to you by your health care provider. Make sure you discuss any questions you have with your healthcare provider. Document Revised: 09/06/2020 Document Reviewed: 09/06/2020 Elsevier Patient  Education  2022 [employer].

## 2021-08-14 ENCOUNTER — Inpatient Hospital Stay: Payer: Medicare Other

## 2021-08-14 ENCOUNTER — Other Ambulatory Visit: Payer: Self-pay

## 2021-08-14 DIAGNOSIS — C787 Secondary malignant neoplasm of liver and intrahepatic bile duct: Secondary | ICD-10-CM | POA: Diagnosis not present

## 2021-08-14 DIAGNOSIS — R0602 Shortness of breath: Secondary | ICD-10-CM | POA: Diagnosis not present

## 2021-08-14 DIAGNOSIS — C3411 Malignant neoplasm of upper lobe, right bronchus or lung: Secondary | ICD-10-CM

## 2021-08-14 DIAGNOSIS — R059 Cough, unspecified: Secondary | ICD-10-CM | POA: Diagnosis not present

## 2021-08-14 DIAGNOSIS — Z95828 Presence of other vascular implants and grafts: Secondary | ICD-10-CM

## 2021-08-14 DIAGNOSIS — Z5111 Encounter for antineoplastic chemotherapy: Secondary | ICD-10-CM | POA: Diagnosis not present

## 2021-08-14 DIAGNOSIS — R0609 Other forms of dyspnea: Secondary | ICD-10-CM | POA: Diagnosis not present

## 2021-08-14 LAB — CBC WITH DIFFERENTIAL (CANCER CENTER ONLY)
Abs Immature Granulocytes: 0.01 10*3/uL (ref 0.00–0.07)
Basophils Absolute: 0 10*3/uL (ref 0.0–0.1)
Basophils Relative: 1 %
Eosinophils Absolute: 0 10*3/uL (ref 0.0–0.5)
Eosinophils Relative: 1 %
HCT: 38.8 % — ABNORMAL LOW (ref 39.0–52.0)
Hemoglobin: 12.9 g/dL — ABNORMAL LOW (ref 13.0–17.0)
Immature Granulocytes: 0 %
Lymphocytes Relative: 9 %
Lymphs Abs: 0.4 10*3/uL — ABNORMAL LOW (ref 0.7–4.0)
MCH: 29.6 pg (ref 26.0–34.0)
MCHC: 33.2 g/dL (ref 30.0–36.0)
MCV: 89 fL (ref 80.0–100.0)
Monocytes Absolute: 0.1 10*3/uL (ref 0.1–1.0)
Monocytes Relative: 3 %
Neutro Abs: 4.1 10*3/uL (ref 1.7–7.7)
Neutrophils Relative %: 86 %
Platelet Count: 153 10*3/uL (ref 150–400)
RBC: 4.36 MIL/uL (ref 4.22–5.81)
RDW: 14.6 % (ref 11.5–15.5)
WBC Count: 4.8 10*3/uL (ref 4.0–10.5)
nRBC: 0 % (ref 0.0–0.2)

## 2021-08-14 LAB — CMP (CANCER CENTER ONLY)
ALT: 47 U/L — ABNORMAL HIGH (ref 0–44)
AST: 23 U/L (ref 15–41)
Albumin: 3.7 g/dL (ref 3.5–5.0)
Alkaline Phosphatase: 61 U/L (ref 38–126)
Anion gap: 9 (ref 5–15)
BUN: 19 mg/dL (ref 8–23)
CO2: 24 mmol/L (ref 22–32)
Calcium: 8.9 mg/dL (ref 8.9–10.3)
Chloride: 104 mmol/L (ref 98–111)
Creatinine: 1.31 mg/dL — ABNORMAL HIGH (ref 0.61–1.24)
GFR, Estimated: 58 mL/min — ABNORMAL LOW (ref >60)
Glucose, Bld: 181 mg/dL — ABNORMAL HIGH (ref 70–99)
Potassium: 4.7 mmol/L (ref 3.5–5.1)
Sodium: 137 mmol/L (ref 135–145)
Total Bilirubin: 0.4 mg/dL (ref 0.3–1.2)
Total Protein: 6.5 g/dL (ref 6.5–8.1)

## 2021-08-14 MED ORDER — SODIUM CHLORIDE 0.9% FLUSH
10.0000 mL | Freq: Once | INTRAVENOUS | Status: AC
  Administered 2021-08-14: 10 mL

## 2021-08-14 MED ORDER — HEPARIN SOD (PORK) LOCK FLUSH 100 UNIT/ML IV SOLN
500.0000 [IU] | Freq: Once | INTRAVENOUS | Status: AC
  Administered 2021-08-14: 500 [IU]

## 2021-08-21 ENCOUNTER — Other Ambulatory Visit: Payer: Self-pay

## 2021-08-21 ENCOUNTER — Inpatient Hospital Stay: Payer: Medicare Other | Attending: Internal Medicine

## 2021-08-21 DIAGNOSIS — K769 Liver disease, unspecified: Secondary | ICD-10-CM | POA: Diagnosis not present

## 2021-08-21 DIAGNOSIS — C3411 Malignant neoplasm of upper lobe, right bronchus or lung: Secondary | ICD-10-CM | POA: Insufficient documentation

## 2021-08-21 DIAGNOSIS — J90 Pleural effusion, not elsewhere classified: Secondary | ICD-10-CM | POA: Diagnosis not present

## 2021-08-21 DIAGNOSIS — Z95828 Presence of other vascular implants and grafts: Secondary | ICD-10-CM

## 2021-08-21 DIAGNOSIS — Z5111 Encounter for antineoplastic chemotherapy: Secondary | ICD-10-CM | POA: Insufficient documentation

## 2021-08-21 LAB — CBC WITH DIFFERENTIAL (CANCER CENTER ONLY)
Abs Immature Granulocytes: 0 10*3/uL (ref 0.00–0.07)
Basophils Absolute: 0 10*3/uL (ref 0.0–0.1)
Basophils Relative: 1 %
Eosinophils Absolute: 0.1 10*3/uL (ref 0.0–0.5)
Eosinophils Relative: 4 %
HCT: 37.4 % — ABNORMAL LOW (ref 39.0–52.0)
Hemoglobin: 12.6 g/dL — ABNORMAL LOW (ref 13.0–17.0)
Immature Granulocytes: 0 %
Lymphocytes Relative: 26 %
Lymphs Abs: 0.7 10*3/uL (ref 0.7–4.0)
MCH: 29.9 pg (ref 26.0–34.0)
MCHC: 33.7 g/dL (ref 30.0–36.0)
MCV: 88.8 fL (ref 80.0–100.0)
Monocytes Absolute: 0.4 10*3/uL (ref 0.1–1.0)
Monocytes Relative: 18 %
Neutro Abs: 1.3 10*3/uL — ABNORMAL LOW (ref 1.7–7.7)
Neutrophils Relative %: 51 %
Platelet Count: 145 10*3/uL — ABNORMAL LOW (ref 150–400)
RBC: 4.21 MIL/uL — ABNORMAL LOW (ref 4.22–5.81)
RDW: 15.5 % (ref 11.5–15.5)
WBC Count: 2.5 10*3/uL — ABNORMAL LOW (ref 4.0–10.5)
nRBC: 0 % (ref 0.0–0.2)

## 2021-08-21 LAB — CMP (CANCER CENTER ONLY)
ALT: 25 U/L (ref 0–44)
AST: 22 U/L (ref 15–41)
Albumin: 3.7 g/dL (ref 3.5–5.0)
Alkaline Phosphatase: 73 U/L (ref 38–126)
Anion gap: 11 (ref 5–15)
BUN: 18 mg/dL (ref 8–23)
CO2: 25 mmol/L (ref 22–32)
Calcium: 9.2 mg/dL (ref 8.9–10.3)
Chloride: 103 mmol/L (ref 98–111)
Creatinine: 1.42 mg/dL — ABNORMAL HIGH (ref 0.61–1.24)
GFR, Estimated: 53 mL/min — ABNORMAL LOW (ref >60)
Glucose, Bld: 159 mg/dL — ABNORMAL HIGH (ref 70–99)
Potassium: 4.3 mmol/L (ref 3.5–5.1)
Sodium: 139 mmol/L (ref 135–145)
Total Bilirubin: 0.4 mg/dL (ref 0.3–1.2)
Total Protein: 6.5 g/dL (ref 6.5–8.1)

## 2021-08-21 MED ORDER — SODIUM CHLORIDE 0.9% FLUSH
10.0000 mL | Freq: Once | INTRAVENOUS | Status: AC
Start: 2021-08-21 — End: 2021-08-21
  Administered 2021-08-21: 10 mL

## 2021-08-21 MED ORDER — HEPARIN SOD (PORK) LOCK FLUSH 100 UNIT/ML IV SOLN
500.0000 [IU] | Freq: Once | INTRAVENOUS | Status: AC
  Administered 2021-08-21: 500 [IU]

## 2021-08-27 MED FILL — Dexamethasone Sodium Phosphate Inj 100 MG/10ML: INTRAMUSCULAR | Qty: 1 | Status: AC

## 2021-08-28 ENCOUNTER — Other Ambulatory Visit: Payer: Self-pay

## 2021-08-28 ENCOUNTER — Inpatient Hospital Stay (HOSPITAL_BASED_OUTPATIENT_CLINIC_OR_DEPARTMENT_OTHER): Payer: Medicare Other | Admitting: Internal Medicine

## 2021-08-28 ENCOUNTER — Encounter: Payer: Self-pay | Admitting: Internal Medicine

## 2021-08-28 ENCOUNTER — Inpatient Hospital Stay: Payer: Medicare Other

## 2021-08-28 VITALS — HR 96

## 2021-08-28 VITALS — BP 153/86 | HR 101 | Temp 97.3°F | Resp 20 | Ht 69.0 in | Wt 281.9 lb

## 2021-08-28 DIAGNOSIS — J90 Pleural effusion, not elsewhere classified: Secondary | ICD-10-CM | POA: Diagnosis not present

## 2021-08-28 DIAGNOSIS — K769 Liver disease, unspecified: Secondary | ICD-10-CM | POA: Diagnosis not present

## 2021-08-28 DIAGNOSIS — C3411 Malignant neoplasm of upper lobe, right bronchus or lung: Secondary | ICD-10-CM

## 2021-08-28 DIAGNOSIS — Z5111 Encounter for antineoplastic chemotherapy: Secondary | ICD-10-CM | POA: Diagnosis not present

## 2021-08-28 DIAGNOSIS — Z95828 Presence of other vascular implants and grafts: Secondary | ICD-10-CM

## 2021-08-28 DIAGNOSIS — Z5112 Encounter for antineoplastic immunotherapy: Secondary | ICD-10-CM

## 2021-08-28 LAB — CMP (CANCER CENTER ONLY)
ALT: 23 U/L (ref 0–44)
AST: 21 U/L (ref 15–41)
Albumin: 3.9 g/dL (ref 3.5–5.0)
Alkaline Phosphatase: 67 U/L (ref 38–126)
Anion gap: 11 (ref 5–15)
BUN: 19 mg/dL (ref 8–23)
CO2: 24 mmol/L (ref 22–32)
Calcium: 9.2 mg/dL (ref 8.9–10.3)
Chloride: 105 mmol/L (ref 98–111)
Creatinine: 1.32 mg/dL — ABNORMAL HIGH (ref 0.61–1.24)
GFR, Estimated: 58 mL/min — ABNORMAL LOW (ref >60)
Glucose, Bld: 189 mg/dL — ABNORMAL HIGH (ref 70–99)
Potassium: 4.4 mmol/L (ref 3.5–5.1)
Sodium: 140 mmol/L (ref 135–145)
Total Bilirubin: 0.5 mg/dL (ref 0.3–1.2)
Total Protein: 6.5 g/dL (ref 6.5–8.1)

## 2021-08-28 LAB — CBC WITH DIFFERENTIAL (CANCER CENTER ONLY)
Abs Immature Granulocytes: 0.02 10*3/uL (ref 0.00–0.07)
Basophils Absolute: 0.1 10*3/uL (ref 0.0–0.1)
Basophils Relative: 1 %
Eosinophils Absolute: 0.1 10*3/uL (ref 0.0–0.5)
Eosinophils Relative: 3 %
HCT: 39.3 % (ref 39.0–52.0)
Hemoglobin: 13 g/dL (ref 13.0–17.0)
Immature Granulocytes: 1 %
Lymphocytes Relative: 20 %
Lymphs Abs: 0.8 10*3/uL (ref 0.7–4.0)
MCH: 29.6 pg (ref 26.0–34.0)
MCHC: 33.1 g/dL (ref 30.0–36.0)
MCV: 89.5 fL (ref 80.0–100.0)
Monocytes Absolute: 0.6 10*3/uL (ref 0.1–1.0)
Monocytes Relative: 16 %
Neutro Abs: 2.4 10*3/uL (ref 1.7–7.7)
Neutrophils Relative %: 59 %
Platelet Count: 220 10*3/uL (ref 150–400)
RBC: 4.39 MIL/uL (ref 4.22–5.81)
RDW: 15.2 % (ref 11.5–15.5)
WBC Count: 4 10*3/uL (ref 4.0–10.5)
nRBC: 0 % (ref 0.0–0.2)

## 2021-08-28 MED ORDER — SODIUM CHLORIDE 0.9 % IV SOLN
3.1400 mg/m2 | Freq: Once | INTRAVENOUS | Status: AC
  Administered 2021-08-28: 8 mg via INTRAVENOUS
  Filled 2021-08-28: qty 16

## 2021-08-28 MED ORDER — SODIUM CHLORIDE 0.9 % IV SOLN
10.0000 mg | Freq: Once | INTRAVENOUS | Status: AC
  Administered 2021-08-28: 10 mg via INTRAVENOUS
  Filled 2021-08-28: qty 10

## 2021-08-28 MED ORDER — SODIUM CHLORIDE 0.9% FLUSH
10.0000 mL | Freq: Once | INTRAVENOUS | Status: AC
  Administered 2021-08-28: 10 mL

## 2021-08-28 MED ORDER — HEPARIN SOD (PORK) LOCK FLUSH 100 UNIT/ML IV SOLN
500.0000 [IU] | Freq: Once | INTRAVENOUS | Status: AC | PRN
  Administered 2021-08-28: 500 [IU]

## 2021-08-28 MED ORDER — SODIUM CHLORIDE 0.9 % IV SOLN: Freq: Once | INTRAVENOUS | Status: AC

## 2021-08-28 MED ORDER — PALONOSETRON HCL INJECTION 0.25 MG/5ML
0.2500 mg | Freq: Once | INTRAVENOUS | Status: AC
  Administered 2021-08-28: 0.25 mg via INTRAVENOUS
  Filled 2021-08-28: qty 5

## 2021-08-28 MED ORDER — SODIUM CHLORIDE 0.9% FLUSH
10.0000 mL | INTRAVENOUS | Status: DC | PRN
  Administered 2021-08-28: 10 mL

## 2021-08-28 NOTE — Patient Instructions (Signed)
[facility]  Discharge Instructions: Thank you for choosing [facility] to provide your oncology and hematology care.   If you have a lab appointment with the [facility], please go directly to the [facility] and check in at the registration area.   Wear comfortable clothing and clothing appropriate for easy access to any Portacath or PICC line.   We strive to give you quality time with your provider. You may need to reschedule your appointment if you arrive late (15 or more minutes).  Arriving late affects you and other patients whose appointments are after yours.  Also, if you miss three or more appointments without notifying the office, you may be dismissed from the clinic at the provider's discretion.      For prescription refill requests, have your pharmacy contact our office and allow 72 hours for refills to be completed.    Today you received the following chemotherapy and/or immunotherapy agents: Zepzelca      To help prevent nausea and vomiting after your treatment, we encourage you to take your nausea medication as directed.  BELOW ARE SYMPTOMS THAT SHOULD BE REPORTED IMMEDIATELY: *FEVER GREATER THAN 100.4 F (38 C) OR HIGHER *CHILLS OR SWEATING *NAUSEA AND VOMITING THAT IS NOT CONTROLLED WITH YOUR NAUSEA MEDICATION *UNUSUAL SHORTNESS OF BREATH *UNUSUAL BRUISING OR BLEEDING *URINARY PROBLEMS (pain or burning when urinating, or frequent urination) *BOWEL PROBLEMS (unusual diarrhea, constipation, pain near the anus) TENDERNESS IN MOUTH AND THROAT WITH OR WITHOUT PRESENCE OF ULCERS (sore throat, sores in mouth, or a toothache) UNUSUAL RASH, SWELLING OR PAIN  UNUSUAL VAGINAL DISCHARGE OR ITCHING   Items with * indicate a potential emergency and should be followed up as soon as possible or go to the Emergency Department if any problems should occur.  Please show the CHEMOTHERAPY ALERT CARD or IMMUNOTHERAPY ALERT CARD at check-in to  the Emergency Department and triage nurse.  Should you have questions after your visit or need to cancel or reschedule your appointment, please contact [facility]  Dept: [phone]  and follow the prompts.  Office hours are 8:00 a.m. to 4:30 p.m. Monday - Friday. Please note that voicemails left after 4:00 p.m. may not be returned until the following business day.  We are closed weekends and major holidays. You have access to a nurse at all times for urgent questions. Please call the main number to the clinic Dept: [phone] and follow the prompts.   For any non-urgent questions, you may also contact your provider using MyChart. We now offer e-Visits for anyone 4 and older to request care online for non-urgent symptoms. For details visit [email or web address].   Also download the MyChart app! Go to the app store, search "MyChart", open the app, select [facility], and log in with your MyChart username and password.  Due to Covid, a mask is required upon entering the hospital/clinic. If you do not have a mask, one will be given to you upon arrival. For doctor visits, patients may have 1 support person aged 34 or older with them. For treatment visits, patients cannot have anyone with them due to current Covid guidelines and our immunocompromised population.

## 2021-08-28 NOTE — Progress Notes (Signed)
[place] [phone]   [phone]  OFFICE PROGRESS NOTE  [name], NP [place] [address]  DIAGNOSIS: Relapsed extensive stage (T4, N3, M1c)  small cell lung cancer diagnosed in April 2021 and presented with large right upper lobe/right hilar mass with ipsilateral and contralateral mediastinal and right supraclavicular lymphadenopathy in addition to multiple liver lesios. He has disease progression after the first line of chemotherapy in December 2021.  PRIOR THERAPY:  1) Palliative radiotherapy to the right upper lobe lung mass under the care of Dr. Lisbeth Renshaw. 2) Systemic chemotherapy with carboplatin for AUC of 5 on day 1, etoposide 100 mg/M2 on days 1, 2 and 3 in addition to Imfinzi 1500 mg IV every 3 weeks with chemotherapy then every 4 weeks for maintenance if the patient has no evidence for progression.  He will also receive Cosela 240 mg/m2 on the days of the chemotherapy.  Status post 9 cycles.  Starting from cycle #5 the patient will be on maintenance treatment with immunotherapy with Imfinzi 1500 mg IV every 4 weeks. Last dose of chemotherapy was given on November 13, 2020. This treatment was discontinued secondary to disease progression. 3) Systemic chemotherapy with carboplatin for AUC of 5 on day 1, etoposide 100 mg/M2 on days 1, 2 and 3, Tecentriq 1200 mg IV every 3 weeks as well as Cosela 250 mg/M2 on the days of the chemotherapy every 3 weeks.  First dose December 18, 2020.  Status post 8 cycles.   CURRENT THERAPY: Zepzelca (lurbinectedin) 3.2 mgm2 IV every 3 weeks. First dose expected on 06/05/21. Status post 4 cycles.   INTERVAL HISTORY: Adam Wise 70 y.o. male returns to the clinic today for follow-up visit accompanied by his wife.  The patient is feeling fine today with no concerning complaints except for the baseline shortness of breath increased with exertion.  He underwent ultrasound-guided right  thoracentesis with drainage of 620 mL of pleural fluid.  The patient also has occasional dizzy spells.  He denied having any chest pain, cough or hemoptysis.  He has no nausea, vomiting, diarrhea or constipation.  He has no headache or visual changes.  He tolerated the last cycle of his treatment fairly well.  He is here for evaluation before starting cycle #5.  MEDICAL HISTORY: Past Medical History:  Diagnosis Date   Cancer [facility])    Chickenpox    Chronic knee pain    Chronic low back pain    Essential hypertension    GERD (gastroesophageal reflux disease)    OSA (obstructive sleep apnea)    Testosterone deficiency    Type 2 diabetes mellitus (HCC)     ALLERGIES:  is allergic to bupropion and hydrochlorothiazide.  MEDICATIONS:  Current Outpatient Medications  Medication Sig Dispense Refill   albuterol (VENTOLIN HFA) 108 (90 Base) MCG/ACT inhaler Inhale 2 puffs into the lungs every 6 (six) hours as needed for wheezing or shortness of breath. 18 g 0   amLODipine (NORVASC) 10 MG tablet TAKE 1 TABLET BY MOUTH EVERY DAY for blood pressure. 90 tablet 1   B-D 3CC LUER-LOK SYR 22GX1" 22G X 1" 3 ML MISC USE AS INSTRUCTED FOR TESTOSTERONE INJECTION EVERY 2 WEEKS 10 each 2   Black Pepper-Turmeric (TURMERIC COMPLEX/BLACK PEPPER PO) Take 1 tablet by mouth in the morning and at bedtime.     blood glucose meter kit and supplies KIT Dispense based on patient and insurance preference. Use up to  four times daily as directed. 1 each 0   chlorpheniramine-HYDROcodone (TUSSIONEX) 10-8 MG/5ML SUER Take 5 mLs by mouth at bedtime as needed for cough. 473 mL 0   Coenzyme Q10 ([employer] COQ-10) 200 MG capsule Take 200 mg by mouth every evening.      ELIQUIS 5 MG TABS tablet TAKE 1 TABLET BY MOUTH TWICE A DAY 180 tablet 2   esomeprazole (NEXIUM) 20 MG capsule Take 20 mg by mouth daily.      fluticasone (FLONASE) 50 MCG/ACT nasal spray Place 2 sprays into both nostrils daily as needed for allergies.     gabapentin  (NEURONTIN) 300 MG capsule Take 300 mg by mouth 2 (two) times daily.      glipiZIDE (GLUCOTROL) 10 MG tablet TAKE 1 TABLET (10 MG TOTAL) BY MOUTH 2 (TWO) TIMES DAILY BEFORE A MEAL. FOR DIABETES. 180 tablet 1   Insulin Glargine (BASAGLAR KWIKPEN) 100 UNIT/ML Inject 20 Units into the skin at bedtime. For diabetes. (Patient not taking: Reported on 08/13/2021) 15 mL 3   Insulin Pen Needle (BD PEN NEEDLE NANO U/F) 32G X 4 MM MISC Use with insulin as prescribed  Dx Code: E11.9 100 each 2   KRILL OIL PO Take 350 mg by mouth daily.     Lancets MISC USE UP TO 3 TIMES DAILY AS DIRECTED 100 each 2   lidocaine-prilocaine (EMLA) cream Apply 1 application topically as needed. 30 g 2   losartan (COZAAR) 25 MG tablet Take 1 tablet (25 mg total) by mouth daily. For blood pressure. 90 tablet 0   Melatonin 10 MG CAPS Take 10 mg by mouth at bedtime.     metFORMIN (GLUCOPHAGE) 1000 MG tablet Take 1 tablet (1,000 mg total) by mouth 2 (two) times daily with a meal. For diabetes 180 tablet 0   Multiple Vitamin (MULTI-VITAMINS) TABS Take 1 tablet by mouth daily.      ONETOUCH ULTRA test strip USE UP TO 4 TIMES DAILY AS DIRECTED 100 strip 5   OVER THE COUNTER MEDICATION Take by mouth.     oxymetazoline (AFRIN) 0.05 % nasal spray Place 1 spray into both nostrils 2 (two) times daily as needed for congestion.     pioglitazone (ACTOS) 45 MG tablet Take 1 tablet (45 mg total) by mouth daily. For diabetes. 90 tablet 2   pravastatin (PRAVACHOL) 40 MG tablet TAKE 1 TABLET BY MOUTH EVERY DAY IN THE EVENING FOR CHOLESTEROL 90 tablet 1   prochlorperazine (COMPAZINE) 10 MG tablet Take 1 tablet (10 mg total) by mouth every 6 (six) hours as needed for nausea or vomiting. 30 tablet 0   sildenafil (REVATIO) 20 MG tablet Take 3-5 tablets by mouth 1 hour prior to intercourse as needed. 50 tablet 0   testosterone cypionate (DEPOTESTOSTERONE CYPIONATE) 200 MG/ML injection Inject 200 mg into the muscle every 14 (fourteen) days.     No  current facility-administered medications for this visit.    SURGICAL HISTORY:  Past Surgical History:  Procedure Laterality Date   COLONOSCOPY WITH PROPOFOL N/A 12/17/2018   Procedure: COLONOSCOPY WITH PROPOFOL;  Surgeon: Virgel Manifold, MD;  Location: [facility] ENDOSCOPY;  Service: Gastroenterology;  Laterality: N/A;   IR IMAGING GUIDED PORT INSERTION  04/17/2020   JOINT REPLACEMENT Bilateral    REPLACEMENT TOTAL KNEE BILATERAL  2015   TONSILLECTOMY  1960    REVIEW OF SYSTEMS:  A comprehensive review of systems was negative except for: Respiratory: positive for dyspnea on exertion Neurological: positive for dizziness   PHYSICAL  EXAMINATION: General appearance: alert, cooperative, fatigued, and no distress Head: atraumatic Neck: no adenopathy, no JVD, supple, symmetrical, trachea midline, and thyroid not enlarged, symmetric, no tenderness/mass/nodules Lymph nodes: Cervical, supraclavicular, and axillary nodes normal. Resp: clear to auscultation bilaterally Back: symmetric, no curvature. ROM normal. No CVA tenderness. Cardio: regular rate and rhythm, S1, S2 normal, no murmur, click, rub or gallop GI: soft, non-tender; bowel sounds normal; no masses,  no organomegaly Extremities: extremities normal, atraumatic, no cyanosis or edema  ECOG PERFORMANCE STATUS: 1 - Symptomatic but completely ambulatory  Blood pressure (!) 153/86, pulse (!) 101, temperature (!) 97.3 F (36.3 C), temperature source Tympanic, resp. rate 20, height _0  (1.753 m), weight 281 lb 14.4 oz (127.9 kg), SpO2 95 %.  LABORATORY DATA: Lab Results  Component Value Date   WBC 4.0 08/28/2021   HGB 13.0 08/28/2021   HCT 39.3 08/28/2021   MCV 89.5 08/28/2021   PLT 220 08/28/2021      Chemistry      Component Value Date/Time   NA 139 08/21/2021 1213   K 4.3 08/21/2021 1213   CL 103 08/21/2021 1213   CO2 25 08/21/2021 1213   BUN 18 08/21/2021 1213   CREATININE 1.42 (H) 08/21/2021 1213      Component Value  Date/Time   CALCIUM 9.2 08/21/2021 1213   ALKPHOS 73 08/21/2021 1213   AST 22 08/21/2021 1213   ALT 25 08/21/2021 1213   BILITOT 0.4 08/21/2021 1213       RADIOGRAPHIC STUDIES: CT Chest W Contrast  Result Date: 08/08/2021 CLINICAL DATA:  Metastatic small-cell lung cancer, status post radiation therapy, ongoing chemotherapy EXAM: CT CHEST, ABDOMEN, AND PELVIS WITH CONTRAST TECHNIQUE: Multidetector CT imaging of the chest, abdomen and pelvis was performed following the standard protocol during bolus administration of intravenous contrast. CONTRAST:  161m OMNIPAQUE IOHEXOL 350 MG/ML SOLN, additional oral enteric contrast COMPARISON:  05/16/2021 FINDINGS: CT CHEST FINDINGS Cardiovascular: Right chest port catheter. Aortic atherosclerosis. Normal heart size. Three-vessel coronary artery calcifications. No pericardial effusion. Mediastinum/Nodes: Unchanged post treatment appearance of right hilar soft tissue (series 2, image 23). No discretely enlarged mediastinal, hilar, or axillary lymph nodes. Thyroid gland, trachea, and esophagus demonstrate no significant findings. Lungs/Pleura: Unchanged post treatment appearance of the right lung with perihilar and suprahilar consolidation and fibrosis. Unchanged small to moderate right pleural effusion. Occasional tiny pulmonary nodules are unchanged, for example a 2 mm nodule of the left upper lobe (series 4, image 48). Musculoskeletal: No chest wall mass or suspicious bone lesions identified. CT ABDOMEN PELVIS FINDINGS Hepatobiliary: Multiple hypodense liver lesions are decreased in size, for example a lesion in the anterior midline liver measuring 1.3 x 0.7 cm, previously 2.5 x 1.6 cm (series 2, image 49) and in the anterior inferior right lobe of the liver, hepatic segment VI, measuring 2.0 x 1.4 cm, previously 4.0 x 2.3 cm (series 2, image 60). No gallstones, gallbladder wall thickening, or biliary dilatation. Pancreas: Unremarkable. No pancreatic ductal  dilatation or surrounding inflammatory changes. Spleen: Normal in size without significant abnormality. Adrenals/Urinary Tract: Unchanged small nodule of the right adrenal gland, measuring 1.7 cm (series 2, image 57). Multiple benign right renal cysts. Kidneys are otherwise normal, without renal calculi, solid lesion, or hydronephrosis. Air-fluid level in the urinary bladder. Stomach/Bowel: Stomach is within normal limits. Appendix appears normal. No evidence of bowel wall thickening, distention, or inflammatory changes. Sigmoid diverticulosis. Vascular/Lymphatic: Aortic atherosclerosis. No enlarged abdominal or pelvic lymph nodes. Reproductive: Prostatomegaly. Other: No abdominal wall hernia or abnormality.  No abdominopelvic ascites. Musculoskeletal: Chronic bilateral pars defects of L5. IMPRESSION: 1. Unchanged post treatment appearance of the right lung with perihilar and suprahilar consolidation and fibrosis. Unchanged small to moderate right pleural effusion. 2. Multiple hypodense liver lesions are decreased in size, consistent with treatment response of hepatic metastatic disease. 3. Occasional tiny pulmonary nodules are unchanged, likely benign and incidental. Attention on follow-up. 4. Unchanged small nodule of the right adrenal gland, measuring 1.7 cm, which remains nonspecific, although likely an incidental small adenoma. Attention on follow-up. 5. Prostatomegaly. 6. Air-fluid level in the urinary bladder, likely due to recent catheterization. 7. Coronary artery disease. Aortic Atherosclerosis (ICD10-I70.0). Electronically Signed   By: Eddie Candle M.D.   On: 08/08/2021 08:59   CT Abdomen Pelvis W Contrast  Result Date: 08/08/2021 CLINICAL DATA:  Metastatic small-cell lung cancer, status post radiation therapy, ongoing chemotherapy EXAM: CT CHEST, ABDOMEN, AND PELVIS WITH CONTRAST TECHNIQUE: Multidetector CT imaging of the chest, abdomen and pelvis was performed following the standard protocol during  bolus administration of intravenous contrast. CONTRAST:  115m OMNIPAQUE IOHEXOL 350 MG/ML SOLN, additional oral enteric contrast COMPARISON:  05/16/2021 FINDINGS: CT CHEST FINDINGS Cardiovascular: Right chest port catheter. Aortic atherosclerosis. Normal heart size. Three-vessel coronary artery calcifications. No pericardial effusion. Mediastinum/Nodes: Unchanged post treatment appearance of right hilar soft tissue (series 2, image 23). No discretely enlarged mediastinal, hilar, or axillary lymph nodes. Thyroid gland, trachea, and esophagus demonstrate no significant findings. Lungs/Pleura: Unchanged post treatment appearance of the right lung with perihilar and suprahilar consolidation and fibrosis. Unchanged small to moderate right pleural effusion. Occasional tiny pulmonary nodules are unchanged, for example a 2 mm nodule of the left upper lobe (series 4, image 48). Musculoskeletal: No chest wall mass or suspicious bone lesions identified. CT ABDOMEN PELVIS FINDINGS Hepatobiliary: Multiple hypodense liver lesions are decreased in size, for example a lesion in the anterior midline liver measuring 1.3 x 0.7 cm, previously 2.5 x 1.6 cm (series 2, image 49) and in the anterior inferior right lobe of the liver, hepatic segment VI, measuring 2.0 x 1.4 cm, previously 4.0 x 2.3 cm (series 2, image 60). No gallstones, gallbladder wall thickening, or biliary dilatation. Pancreas: Unremarkable. No pancreatic ductal dilatation or surrounding inflammatory changes. Spleen: Normal in size without significant abnormality. Adrenals/Urinary Tract: Unchanged small nodule of the right adrenal gland, measuring 1.7 cm (series 2, image 57). Multiple benign right renal cysts. Kidneys are otherwise normal, without renal calculi, solid lesion, or hydronephrosis. Air-fluid level in the urinary bladder. Stomach/Bowel: Stomach is within normal limits. Appendix appears normal. No evidence of bowel wall thickening, distention, or inflammatory  changes. Sigmoid diverticulosis. Vascular/Lymphatic: Aortic atherosclerosis. No enlarged abdominal or pelvic lymph nodes. Reproductive: Prostatomegaly. Other: No abdominal wall hernia or abnormality. No abdominopelvic ascites. Musculoskeletal: Chronic bilateral pars defects of L5. IMPRESSION: 1. Unchanged post treatment appearance of the right lung with perihilar and suprahilar consolidation and fibrosis. Unchanged small to moderate right pleural effusion. 2. Multiple hypodense liver lesions are decreased in size, consistent with treatment response of hepatic metastatic disease. 3. Occasional tiny pulmonary nodules are unchanged, likely benign and incidental. Attention on follow-up. 4. Unchanged small nodule of the right adrenal gland, measuring 1.7 cm, which remains nonspecific, although likely an incidental small adenoma. Attention on follow-up. 5. Prostatomegaly. 6. Air-fluid level in the urinary bladder, likely due to recent catheterization. 7. Coronary artery disease. Aortic Atherosclerosis (ICD10-I70.0). Electronically Signed   By: AEddie CandleM.D.   On: 08/08/2021 08:59   [facility]  Result Date: 08/12/2021 CLINICAL DATA:  Status post right thoracentesis EXAM: PORTABLE CHEST 1 VIEW COMPARISON:  06/12/2021 FINDINGS: Heart is mildly enlarged. No pulmonary vascular congestion. Right hilar opacities similar to prior examination. Right chest port unchanged in position. No pneumothorax. IMPRESSION: No pneumothorax status post right thoracentesis. Right hilar opacity is not significantly changed from prior exam. Electronically Signed   By: Miachel Roux M.D.   On: 08/12/2021 12:33   US Thoracentesis Asp Pleural space w/IMG guide  Result Date: 08/12/2021 INDICATION: Patient with history of right-sided small cell lung cancer with recurrent pleural effusion. Request is for therapeutic right-sided thoracentesis EXAM: ULTRASOUND GUIDED RIGHT-SIDED THERAPEUTIC THORACENTESIS MEDICATIONS: Lidocaine 1% 10  mL COMPLICATIONS: None immediate. PROCEDURE: An ultrasound guided thoracentesis was thoroughly discussed with the patient and questions answered. The benefits, risks, alternatives and complications were also discussed. The patient understands and wishes to proceed with the procedure. Written consent was obtained. Ultrasound was performed to localize and mark an adequate pocket of fluid in the right chest. The area was then prepped and draped in the normal sterile fashion. 1% Lidocaine was used for local anesthesia. Under ultrasound guidance a 6 Fr Safe-T-Centesis catheter was introduced. Thoracentesis was performed. The catheter was removed and a dressing applied. FINDINGS: A total of approximately 620 mL of amber color fluid was removed. IMPRESSION: Successful ultrasound guided therapeutic right-sided thoracentesis yielding 620 mL of pleural fluid. Read by: Rushie Nyhan, NP Electronically Signed   By: Michaelle Birks M.D.   On: 08/12/2021 15:14    ASSESSMENT AND PLAN: This is a very pleasant 70 years old white male recently diagnosed with extensive stage (T4, N3, M1 C) small cell lung cancer presented with large right upper lobe lung mass in addition to mediastinal and right supraclavicular lymphadenopathy and multiple metastatic liver lesions diagnosed in April 2021. The patient underwent systemic chemotherapy with carboplatin for AUC of 5 on day 1, etoposide 100 mg/M2 on days 1, 2 and 3 in addition to Cosela for myeloprotection during the days of the chemotherapy.  He will also receive immunotherapy with Imfinzi on day one of the chemotherapy cycle.  Status post 9 cycles.  Starting from cycle #5 he is on maintenance treatment with Imfinzi 1500 mg IV every 4 weeks. The patient has been tolerating this treatment well with no concerning adverse effect except for the worsening cough and shortness of breath recently.   He had repeat CT scan of the chest, abdomen pelvis performed recently.  I personally and  independently reviewed the scan images and discussed the results with the patient and his wife today. Unfortunately his scan showed evidence for disease progression with increase in the right pleural effusion as well as enlargement of multiple liver lesions. I recommended for the patient to discontinue his current maintenance treatment with Imfinzi at this point. He resumed systemic chemotherapy with carboplatin for AUC of 5 on day 1, etoposide 100 mg/M2 on days 1, 2 and 3 with Tecentriq 1200 mg every 3 weeks as well as Cosela on the days of the chemotherapy.  He is status post 8 cycles.  This treatment was discontinued secondary to disease progression. The patient is currently undergoing third line treatment with systemic chemotherapy with Zepzelca (lurbinectedin) 3.2 Mg/KG every 3 weeks status post 4 cycles.  The patient continues to tolerate his treatment with Zepzelca (lurbinectedin) fairly well with no concerning adverse effects. His CBC is unremarkable today. I recommended for the patient to proceed with cycle #5 today as planned. Regarding the dizzy  spells, I recommended for the patient to have MRI of the brain for further evaluation but he would like to hold on it for now and he will call if his condition is getting worse. I will see him back for follow-up visit in 3 weeks for evaluation before starting cycle #6. The patient was advised to call immediately if he has any other concerning symptoms in the interval. The patient voices understanding of current disease status and treatment options and is in agreement with the current care plan.  All questions were answered. The patient knows to call the clinic with any problems, questions or concerns. We can certainly see the patient much sooner if necessary.  Disclaimer: This note was dictated with voice recognition software. Similar sounding words can inadvertently be transcribed and may not be corrected upon review.

## 2021-08-29 ENCOUNTER — Telehealth: Payer: Self-pay | Admitting: Internal Medicine

## 2021-08-29 NOTE — Telephone Encounter (Signed)
Scheduled appt per 9/14 los- called pt and he is aware of appt on 10/4

## 2021-09-02 ENCOUNTER — Encounter: Payer: Self-pay | Admitting: Internal Medicine

## 2021-09-09 ENCOUNTER — Other Ambulatory Visit: Payer: Self-pay | Admitting: Primary Care

## 2021-09-09 DIAGNOSIS — E119 Type 2 diabetes mellitus without complications: Secondary | ICD-10-CM

## 2021-09-09 NOTE — Progress Notes (Signed)
[facility] OFFICE PROGRESS NOTE  [name], NP [place] [address]  DIAGNOSIS: Relapsed extensive stage (T4, N3, M1c)  small cell lung cancer diagnosed in April 2021 and presented with large right upper lobe/right hilar mass with ipsilateral and contralateral mediastinal and right supraclavicular lymphadenopathy in addition to multiple liver lesios. He has disease progression after the first line of chemotherapy in December 2021.  PRIOR THERAPY: 1) Palliative radiotherapy to the right upper lobe lung mass under the care of Dr. Lisbeth Renshaw. 2) Systemic chemotherapy with carboplatin for AUC of 5 on day 1, etoposide 100 mg/M2 on days 1, 2 and 3 in addition to Imfinzi 1500 mg IV every 3 weeks with chemotherapy then every 4 weeks for maintenance if the patient has no evidence for progression.  He will also receive Cosela 240 mg/m2 on the days of the chemotherapy.  Status post 9 cycles.  Starting from cycle #5 the patient will be on maintenance treatment with immunotherapy with Imfinzi 1500 mg IV every 4 weeks. Last dose of chemotherapy was given on November 13, 2020. This treatment was discontinued secondary to disease progression 3) Systemic chemotherapy with carboplatin for AUC of 5 on day 1, etoposide 100 mg/M2 on days 1, 2 and 3, Tecentriq 1200 mg IV every 3 weeks as well as Cosela 250 mg/M2 on the days of the chemotherapy every 3 weeks.  First dose December 18, 2020.  Status post 8 cycles.  CURRENT THERAPY:  Zepzelca 3.2 mgm2 IV every 3 weeks. First dose expected on 06/05/21. Status post 5 cycles.   INTERVAL HISTORY: [name] 70 y.o. male returns to the clinic today for a follow-up visit accompanied by his wife.  The patient is feeling fairly well today without any concerning complaints except for some increase in shortness of breath with exertion.  He states that the shortness of breath comes and goes.  He has a history of a right-sided pleural effusion but he  states that he is unsure if that has reaccumulated because he does not have the usual associated right sided aching pain.  He denies any change in his baseline cough which is typically unproductive. He uses tussinex for his cough.  He denies any recent sick contacts.  He denies any fever, chills, night sweats, or unexplained weight loss.  He denies any chest pain.  He denies any nausea, vomiting, or diarrhea.  He has some constipation with hydrocodone use. He states he took his anti-hypertensives at 8 AM as prescribed. He is here today for evaluation and repeat blood work before starting cycle #6.     MEDICAL HISTORY: Past Medical History:  Diagnosis Date   Cancer [facility])    Chickenpox    Chronic knee pain    Chronic low back pain    Essential hypertension    GERD (gastroesophageal reflux disease)    OSA (obstructive sleep apnea)    Testosterone deficiency    Type 2 diabetes mellitus (HCC)     ALLERGIES:  is allergic to bupropion and hydrochlorothiazide.  MEDICATIONS:  Current Outpatient Medications  Medication Sig Dispense Refill   albuterol (VENTOLIN HFA) 108 (90 Base) MCG/ACT inhaler Inhale 2 puffs into the lungs every 6 (six) hours as needed for wheezing or shortness of breath. 18 g 0   amLODipine (NORVASC) 10 MG tablet TAKE 1 TABLET BY MOUTH EVERY DAY for blood pressure. 90 tablet 1   B-D 3CC LUER-LOK SYR 22GX1" 22G X 1" 3 ML MISC USE  AS INSTRUCTED FOR TESTOSTERONE INJECTION EVERY 2 WEEKS 10 each 2   Black Pepper-Turmeric (TURMERIC COMPLEX/BLACK PEPPER PO) Take 1 tablet by mouth in the morning and at bedtime.     blood glucose meter kit and supplies KIT Dispense based on patient and insurance preference. Use up to four times daily as directed. 1 each 0   chlorpheniramine-HYDROcodone (TUSSIONEX) 10-8 MG/5ML SUER Take 5 mLs by mouth at bedtime as needed for cough. 473 mL 0   Coenzyme Q10 ([employer] COQ-10) 200 MG capsule Take 200 mg by mouth every evening.      ELIQUIS 5 MG TABS tablet TAKE  1 TABLET BY MOUTH TWICE A DAY 180 tablet 2   esomeprazole (NEXIUM) 20 MG capsule Take 20 mg by mouth daily.      fluticasone (FLONASE) 50 MCG/ACT nasal spray Place 2 sprays into both nostrils daily as needed for allergies.     gabapentin (NEURONTIN) 300 MG capsule Take 300 mg by mouth 2 (two) times daily.      glipiZIDE (GLUCOTROL) 10 MG tablet TAKE 1 TABLET (10 MG TOTAL) BY MOUTH 2 (TWO) TIMES DAILY BEFORE A MEAL. FOR DIABETES. 180 tablet 1   Insulin Glargine (BASAGLAR KWIKPEN) 100 UNIT/ML Inject 20 Units into the skin at bedtime. For diabetes. (Patient not taking: Reported on 08/13/2021) 15 mL 3   Insulin Pen Needle (BD PEN NEEDLE NANO U/F) 32G X 4 MM MISC Use with insulin as prescribed  Dx Code: E11.9 100 each 2   KRILL OIL PO Take 350 mg by mouth daily.     Lancets MISC USE UP TO 3 TIMES DAILY AS DIRECTED 100 each 2   lidocaine-prilocaine (EMLA) cream Apply 1 application topically as needed. 30 g 2   losartan (COZAAR) 25 MG tablet Take 1 tablet (25 mg total) by mouth daily. For blood pressure. 90 tablet 0   Melatonin 10 MG CAPS Take 10 mg by mouth at bedtime.     metFORMIN (GLUCOPHAGE) 1000 MG tablet TAKE 1 TABLET (1,000 MG TOTAL) BY MOUTH 2 (TWO) TIMES DAILY WITH A MEAL. FOR DIABETES 180 tablet 1   Multiple Vitamin (MULTI-VITAMINS) TABS Take 1 tablet by mouth daily.      ONETOUCH ULTRA test strip USE UP TO 4 TIMES DAILY AS DIRECTED 100 strip 5   OVER THE COUNTER MEDICATION Take by mouth.     oxymetazoline (AFRIN) 0.05 % nasal spray Place 1 spray into both nostrils 2 (two) times daily as needed for congestion.     pioglitazone (ACTOS) 45 MG tablet Take 1 tablet (45 mg total) by mouth daily. For diabetes. 90 tablet 2   pravastatin (PRAVACHOL) 40 MG tablet TAKE 1 TABLET BY MOUTH EVERY DAY IN THE EVENING FOR CHOLESTEROL 90 tablet 1   prochlorperazine (COMPAZINE) 10 MG tablet Take 1 tablet (10 mg total) by mouth every 6 (six) hours as needed for nausea or vomiting. 30 tablet 0   sildenafil  (REVATIO) 20 MG tablet Take 3-5 tablets by mouth 1 hour prior to intercourse as needed. 50 tablet 0   testosterone cypionate (DEPOTESTOSTERONE CYPIONATE) 200 MG/ML injection Inject 200 mg into the muscle every 14 (fourteen) days.     No current facility-administered medications for this visit.   Facility-Administered Medications Ordered in Other Visits  Medication Dose Route Frequency Provider Last Rate Last Admin   dexamethasone (DECADRON) 10 mg in sodium chloride 0.9 % 50 mL IVPB  10 mg Intravenous Once Curt Bears, MD 204 mL/hr at 09/17/21 1155 10 mg at  09/17/21 1155   heparin lock flush 100 unit/mL  500 Units Intracatheter Once PRN Curt Bears, MD       lurbinectedin [facility]) 8 mg in sodium chloride 0.9 % 250 mL chemo infusion  8 mg Intravenous Once Curt Bears, MD       sodium chloride flush (NS) 0.9 % injection 10 mL  10 mL Intracatheter PRN Curt Bears, MD        SURGICAL HISTORY:  Past Surgical History:  Procedure Laterality Date   COLONOSCOPY WITH PROPOFOL N/A 12/17/2018   Procedure: COLONOSCOPY WITH PROPOFOL;  Surgeon: Virgel Manifold, MD;  Location: [facility] ENDOSCOPY;  Service: Gastroenterology;  Laterality: N/A;   IR IMAGING GUIDED PORT INSERTION  04/17/2020   JOINT REPLACEMENT Bilateral    REPLACEMENT TOTAL KNEE BILATERAL  2015   TONSILLECTOMY  1960    REVIEW OF SYSTEMS:   Review of Systems  Constitutional: Negative for appetite change, chills, fatigue, fever and unexpected weight change.  HENT: Negative for mouth sores, nosebleeds, sore throat and trouble swallowing.   Eyes: Negative for eye problems and icterus.  Respiratory: Positive for increased dyspnea on exertion.  Positive for stable chronic dry cough.  Negative for hemoptysis and wheezing.   Cardiovascular: Negative for chest pain and leg swelling.  Gastrointestinal: Negative for abdominal pain, constipation, diarrhea, nausea and vomiting.  Genitourinary: Negative for bladder incontinence,  difficulty urinating, dysuria, frequency and hematuria.   Musculoskeletal: Negative for back pain, gait problem, neck pain and neck stiffness.  Skin: Negative for itching and rash.  Neurological: Negative for dizziness, extremity weakness, gait problem, headaches, light-headedness and seizures.  Hematological: Negative for adenopathy. Does not bruise/bleed easily.  Psychiatric/Behavioral: Negative for confusion, depression and sleep disturbance. The patient is not nervous/anxious.     PHYSICAL EXAMINATION:  Blood pressure (!) 163/96, pulse 97, temperature (!) 97 F (36.1 C), temperature source Tympanic, resp. rate (!) 21, height $RemoveBe'5\' 9"'rhDrzLarW$  (1.753 m), weight 285 lb 14.4 oz (129.7 kg), SpO2 93 %.  ECOG PERFORMANCE STATUS: 1  Physical Exam  Constitutional: Oriented to person, place, and time and well-developed, well-nourished, and in no distress. HENT: Head: Normocephalic and atraumatic. Mouth/Throat: Oropharynx is clear and moist. No oropharyngeal exudate. Eyes: Conjunctivae are normal. Right eye exhibits no discharge. Left eye exhibits no discharge. No scleral icterus. Neck: Normal range of motion. Neck supple. Cardiovascular: Normal rate, regular rhythm, normal heart sounds and intact distal pulses.   Pulmonary/Chest: Decreased breath sounds over the right lower lobe. Effort normal.  No respiratory distress. No wheezes. No rales. Abdominal: Soft. Bowel sounds are normal. Exhibits no distension and no mass. There is no tenderness.  Musculoskeletal: Normal range of motion. Exhibits no edema.  Lymphadenopathy:    No cervical adenopathy.  Neurological: Alert and oriented to person, place, and time. Exhibits normal muscle tone. Gait normal. Coordination normal. Skin: Skin is warm and dry. No rash noted. Not diaphoretic. No erythema. No pallor.  Psychiatric: Mood, memory and judgment normal. Vitals reviewed.  LABORATORY DATA: Lab Results  Component Value Date   WBC 3.4 (L) 09/17/2021   HGB  12.4 (L) 09/17/2021   HCT 37.7 (L) 09/17/2021   MCV 90.4 09/17/2021   PLT 235 09/17/2021      Chemistry      Component Value Date/Time   NA 139 09/17/2021 1022   K 4.3 09/17/2021 1022   CL 105 09/17/2021 1022   CO2 24 09/17/2021 1022   BUN 14 09/17/2021 1022   CREATININE 1.23 09/17/2021 1022  Component Value Date/Time   CALCIUM 8.9 09/17/2021 1022   ALKPHOS 71 09/17/2021 1022   AST 20 09/17/2021 1022   ALT 22 09/17/2021 1022   BILITOT 0.4 09/17/2021 1022       RADIOGRAPHIC STUDIES:  No results found.   ASSESSMENT/PLAN:  This is a very pleasant 70 year old Caucasian male  diagnosed with extensive stage (T4, N3, M1C) small cell lung cancer presented with large right upper lobe lung mass in addition to mediastinal and right supraclavicular lymphadenopathy and multiple metastatic liver lesions diagnosed in April 2021.   The patient initially underwent systemic chemotherapy with carboplatin for an AUC of 5 on day 1, etoposide 100 mg per metered squared on days 1, 2, and 3 in addition to [facility] for myeloprotection.  He also received immunotherapy with Imfinzi on day 1 of every chemotherapy cycle.  He is status post 9 cycles.  Starting from cycle #5 he was on single agent immunotherapy with Imfinzi IV every 4 weeks.   The patient had evidence of disease progression on his scan from December 2021.  He was then started on systemic chemotherapy with carboplatin for an AUC of 5 on day 1, etoposide 100 mg per metered squared on days 1, 2, and 3 in addition to Tecentriq 1200 mg on day 1, the patient is status post 8 cycles.  Starting from cycle #5, the patient started maintenance immunotherapy with Tecentriq.  This was discontinued due to evidence of disease progression.  The patient is currently undergoing palliative systemic chemotherapy with Zepzelca IV every 3 weeks.  He is status post his 5 cycles and he tolerated it well except for fatigue a few days after treatment.    Labs  were reviewed.  Recommend that he proceed with cycle #6 today scheduled.  I reviewed his symptoms with Dr. Arbutus Ped, I will arrange for restaging CT scan the chest, abdomen, and pelvis prior to starting his next cycle of treatment.  However, today we will arrange for the patient to have a chest x-ray performed to rule out recurrent right-sided pleural effusion or infection contributing to his shortness of breath. We can arrange for a ultrasound thoracentesis if needed based on the chest x-ray.   He will continue to use Tussionex for his cough.  The patient was advised to call immediately if he has any concerning symptoms in the interval. The patient voices understanding of current disease status and treatment options and is in agreement with the current care plan. All questions were answered. The patient knows to call the clinic with any problems, questions or concerns. We can certainly see the patient much sooner if necessary           Orders Placed This Encounter  Procedures   CT Chest W Contrast    Standing Status:   Future    Standing Expiration Date:   09/17/2022    Order Specific Question:   If indicated for the ordered procedure, I authorize the administration of contrast media per Radiology protocol    Answer:   Yes    Order Specific Question:   Preferred imaging location?    Answer:   [facility]   CT Abdomen Pelvis W Contrast    Standing Status:   Future    Standing Expiration Date:   09/17/2022    Order Specific Question:   If indicated for the ordered procedure, I authorize the administration of contrast media per Radiology protocol    Answer:   Yes  Order Specific Question:   Preferred imaging location?    Answer:   [facility]    Order Specific Question:   Is Oral Contrast requested for this exam?    Answer:   Yes, Per Radiology protocol   DG Chest 2 View    Standing Status:   Future    Standing Expiration Date:   09/17/2022    Order Specific  Question:   Reason for Exam (SYMPTOM  OR DIAGNOSIS REQUIRED)    Answer:   Small Cell Lung, increased shortness of breath. Hx of pleural effusions (right)    Order Specific Question:   Preferred imaging location?    Answer:   [facility]     The total time spent in the appointment was 30-39 minutes   [place], PA-C 09/17/21

## 2021-09-16 MED FILL — Dexamethasone Sodium Phosphate Inj 100 MG/10ML: INTRAMUSCULAR | Qty: 1 | Status: AC

## 2021-09-17 ENCOUNTER — Inpatient Hospital Stay: Payer: Medicare Other

## 2021-09-17 ENCOUNTER — Ambulatory Visit (HOSPITAL_COMMUNITY)
Admission: RE | Admit: 2021-09-17 | Discharge: 2021-09-17 | Disposition: A | Discharge status: Home / Self Care | Payer: Medicare Other | Source: Ambulatory Visit | Attending: Physician Assistant | Admitting: Physician Assistant

## 2021-09-17 ENCOUNTER — Telehealth: Payer: Self-pay | Admitting: Physician Assistant

## 2021-09-17 ENCOUNTER — Other Ambulatory Visit: Payer: Self-pay

## 2021-09-17 ENCOUNTER — Inpatient Hospital Stay (HOSPITAL_BASED_OUTPATIENT_CLINIC_OR_DEPARTMENT_OTHER): Payer: Medicare Other | Admitting: Physician Assistant

## 2021-09-17 VITALS — BP 163/96 | HR 97 | Temp 97.0°F | Resp 21 | Ht 69.0 in | Wt 285.9 lb

## 2021-09-17 DIAGNOSIS — R918 Other nonspecific abnormal finding of lung field: Secondary | ICD-10-CM | POA: Diagnosis not present

## 2021-09-17 DIAGNOSIS — Z5111 Encounter for antineoplastic chemotherapy: Secondary | ICD-10-CM

## 2021-09-17 DIAGNOSIS — Z95828 Presence of other vascular implants and grafts: Secondary | ICD-10-CM | POA: Diagnosis present

## 2021-09-17 DIAGNOSIS — J449 Chronic obstructive pulmonary disease, unspecified: Secondary | ICD-10-CM | POA: Insufficient documentation

## 2021-09-17 DIAGNOSIS — C3411 Malignant neoplasm of upper lobe, right bronchus or lung: Secondary | ICD-10-CM

## 2021-09-17 DIAGNOSIS — K769 Liver disease, unspecified: Secondary | ICD-10-CM | POA: Insufficient documentation

## 2021-09-17 DIAGNOSIS — E119 Type 2 diabetes mellitus without complications: Secondary | ICD-10-CM | POA: Insufficient documentation

## 2021-09-17 DIAGNOSIS — I10 Essential (primary) hypertension: Secondary | ICD-10-CM | POA: Insufficient documentation

## 2021-09-17 DIAGNOSIS — J90 Pleural effusion, not elsewhere classified: Secondary | ICD-10-CM | POA: Insufficient documentation

## 2021-09-17 DIAGNOSIS — Z85118 Personal history of other malignant neoplasm of bronchus and lung: Secondary | ICD-10-CM | POA: Diagnosis not present

## 2021-09-17 LAB — CMP (CANCER CENTER ONLY)
ALT: 22 U/L (ref 0–44)
AST: 20 U/L (ref 15–41)
Albumin: 3.8 g/dL (ref 3.5–5.0)
Alkaline Phosphatase: 71 U/L (ref 38–126)
Anion gap: 10 (ref 5–15)
BUN: 14 mg/dL (ref 8–23)
CO2: 24 mmol/L (ref 22–32)
Calcium: 8.9 mg/dL (ref 8.9–10.3)
Chloride: 105 mmol/L (ref 98–111)
Creatinine: 1.23 mg/dL (ref 0.61–1.24)
GFR, Estimated: >60 mL/min (ref >60)
Glucose, Bld: 206 mg/dL — ABNORMAL HIGH (ref 70–99)
Potassium: 4.3 mmol/L (ref 3.5–5.1)
Sodium: 139 mmol/L (ref 135–145)
Total Bilirubin: 0.4 mg/dL (ref 0.3–1.2)
Total Protein: 6.5 g/dL (ref 6.5–8.1)

## 2021-09-17 LAB — CBC WITH DIFFERENTIAL (CANCER CENTER ONLY)
Abs Immature Granulocytes: 0.05 10*3/uL (ref 0.00–0.07)
Basophils Absolute: 0 10*3/uL (ref 0.0–0.1)
Basophils Relative: 1 %
Eosinophils Absolute: 0.1 10*3/uL (ref 0.0–0.5)
Eosinophils Relative: 2 %
HCT: 37.7 % — ABNORMAL LOW (ref 39.0–52.0)
Hemoglobin: 12.4 g/dL — ABNORMAL LOW (ref 13.0–17.0)
Immature Granulocytes: 2 %
Lymphocytes Relative: 20 %
Lymphs Abs: 0.7 10*3/uL (ref 0.7–4.0)
MCH: 29.7 pg (ref 26.0–34.0)
MCHC: 32.9 g/dL (ref 30.0–36.0)
MCV: 90.4 fL (ref 80.0–100.0)
Monocytes Absolute: 0.6 10*3/uL (ref 0.1–1.0)
Monocytes Relative: 18 %
Neutro Abs: 2 10*3/uL (ref 1.7–7.7)
Neutrophils Relative %: 57 %
Platelet Count: 235 10*3/uL (ref 150–400)
RBC: 4.17 MIL/uL — ABNORMAL LOW (ref 4.22–5.81)
RDW: 15.4 % (ref 11.5–15.5)
WBC Count: 3.4 10*3/uL — ABNORMAL LOW (ref 4.0–10.5)
nRBC: 0 % (ref 0.0–0.2)

## 2021-09-17 MED ORDER — SODIUM CHLORIDE 0.9% FLUSH
10.0000 mL | INTRAVENOUS | Status: DC | PRN
  Administered 2021-09-17: 10 mL

## 2021-09-17 MED ORDER — SODIUM CHLORIDE 0.9 % IV SOLN: Freq: Once | INTRAVENOUS | Status: AC

## 2021-09-17 MED ORDER — SODIUM CHLORIDE 0.9 % IV SOLN
8.0000 mg | Freq: Once | INTRAVENOUS | Status: AC
  Administered 2021-09-17: 8 mg via INTRAVENOUS
  Filled 2021-09-17: qty 16

## 2021-09-17 MED ORDER — SODIUM CHLORIDE 0.9% FLUSH
10.0000 mL | Freq: Once | INTRAVENOUS | Status: AC
  Administered 2021-09-17: 10 mL

## 2021-09-17 MED ORDER — HEPARIN SOD (PORK) LOCK FLUSH 100 UNIT/ML IV SOLN
500.0000 [IU] | Freq: Once | INTRAVENOUS | Status: AC | PRN
  Administered 2021-09-17: 500 [IU]

## 2021-09-17 MED ORDER — PALONOSETRON HCL INJECTION 0.25 MG/5ML
0.2500 mg | Freq: Once | INTRAVENOUS | Status: AC
  Administered 2021-09-17: 0.25 mg via INTRAVENOUS
  Filled 2021-09-17: qty 5

## 2021-09-17 MED ORDER — SODIUM CHLORIDE 0.9 % IV SOLN
10.0000 mg | Freq: Once | INTRAVENOUS | Status: AC
  Administered 2021-09-17: 10 mg via INTRAVENOUS
  Filled 2021-09-17: qty 10

## 2021-09-17 NOTE — Telephone Encounter (Signed)
I called the patient to review his chest x ray. No pleural effusion, infection, or clear signs or worsening malignancy to explain his intermittent worsening shortness of breath. We will evaluate his treatment response closer on his upcoming CT scan of the chest, abdomen, and pelvis. Of course, if the patient has any new or worsening symptoms, advised to call us sooner for evaluation.

## 2021-10-02 ENCOUNTER — Other Ambulatory Visit: Payer: Self-pay | Admitting: Primary Care

## 2021-10-02 DIAGNOSIS — E1169 Type 2 diabetes mellitus with other specified complication: Secondary | ICD-10-CM

## 2021-10-04 ENCOUNTER — Encounter (HOSPITAL_COMMUNITY): Payer: Self-pay

## 2021-10-04 ENCOUNTER — Ambulatory Visit (HOSPITAL_COMMUNITY)
Admission: RE | Admit: 2021-10-04 | Discharge: 2021-10-04 | Disposition: A | Discharge status: Home / Self Care | Payer: Medicare Other | Source: Ambulatory Visit | Attending: Physician Assistant | Admitting: Physician Assistant

## 2021-10-04 ENCOUNTER — Other Ambulatory Visit: Payer: Self-pay

## 2021-10-04 DIAGNOSIS — R918 Other nonspecific abnormal finding of lung field: Secondary | ICD-10-CM | POA: Diagnosis not present

## 2021-10-04 DIAGNOSIS — J90 Pleural effusion, not elsewhere classified: Secondary | ICD-10-CM | POA: Diagnosis not present

## 2021-10-04 DIAGNOSIS — K769 Liver disease, unspecified: Secondary | ICD-10-CM | POA: Diagnosis not present

## 2021-10-04 DIAGNOSIS — I700 Atherosclerosis of aorta: Secondary | ICD-10-CM | POA: Diagnosis not present

## 2021-10-04 DIAGNOSIS — C3411 Malignant neoplasm of upper lobe, right bronchus or lung: Secondary | ICD-10-CM | POA: Diagnosis not present

## 2021-10-04 DIAGNOSIS — N3289 Other specified disorders of bladder: Secondary | ICD-10-CM | POA: Diagnosis not present

## 2021-10-04 DIAGNOSIS — N281 Cyst of kidney, acquired: Secondary | ICD-10-CM | POA: Diagnosis not present

## 2021-10-04 DIAGNOSIS — R911 Solitary pulmonary nodule: Secondary | ICD-10-CM | POA: Diagnosis not present

## 2021-10-04 DIAGNOSIS — C3490 Malignant neoplasm of unspecified part of unspecified bronchus or lung: Secondary | ICD-10-CM | POA: Diagnosis not present

## 2021-10-04 MED ORDER — HEPARIN SOD (PORK) LOCK FLUSH 100 UNIT/ML IV SOLN
500.0000 [IU] | Freq: Once | INTRAVENOUS | Status: AC
  Administered 2021-10-04: 500 [IU] via INTRAVENOUS

## 2021-10-04 MED ORDER — IOHEXOL 350 MG/ML SOLN
80.0000 mL | Freq: Once | INTRAVENOUS | Status: AC | PRN
  Administered 2021-10-04: 80 mL via INTRAVENOUS

## 2021-10-04 MED ORDER — HEPARIN SOD (PORK) LOCK FLUSH 100 UNIT/ML IV SOLN
INTRAVENOUS | Status: AC
  Filled 2021-10-04: qty 5

## 2021-10-07 MED FILL — Dexamethasone Sodium Phosphate Inj 100 MG/10ML: INTRAMUSCULAR | Qty: 1 | Status: AC

## 2021-10-08 ENCOUNTER — Inpatient Hospital Stay (HOSPITAL_BASED_OUTPATIENT_CLINIC_OR_DEPARTMENT_OTHER): Payer: Medicare Other | Admitting: Internal Medicine

## 2021-10-08 ENCOUNTER — Inpatient Hospital Stay: Payer: Medicare Other

## 2021-10-08 ENCOUNTER — Encounter: Payer: Self-pay | Admitting: Internal Medicine

## 2021-10-08 ENCOUNTER — Other Ambulatory Visit: Payer: Self-pay

## 2021-10-08 ENCOUNTER — Encounter: Payer: Self-pay | Admitting: *Deleted

## 2021-10-08 VITALS — HR 100

## 2021-10-08 VITALS — BP 142/97 | HR 102 | Temp 96.9°F | Resp 18 | Wt 291.7 lb

## 2021-10-08 DIAGNOSIS — C3411 Malignant neoplasm of upper lobe, right bronchus or lung: Secondary | ICD-10-CM

## 2021-10-08 DIAGNOSIS — Z5111 Encounter for antineoplastic chemotherapy: Secondary | ICD-10-CM | POA: Diagnosis not present

## 2021-10-08 DIAGNOSIS — Z95828 Presence of other vascular implants and grafts: Secondary | ICD-10-CM

## 2021-10-08 DIAGNOSIS — R0609 Other forms of dyspnea: Secondary | ICD-10-CM

## 2021-10-08 LAB — CMP (CANCER CENTER ONLY)
ALT: 19 U/L (ref 0–44)
AST: 21 U/L (ref 15–41)
Albumin: 3.6 g/dL (ref 3.5–5.0)
Alkaline Phosphatase: 61 U/L (ref 38–126)
Anion gap: 9 (ref 5–15)
BUN: 11 mg/dL (ref 8–23)
CO2: 25 mmol/L (ref 22–32)
Calcium: 9 mg/dL (ref 8.9–10.3)
Chloride: 105 mmol/L (ref 98–111)
Creatinine: 1.21 mg/dL (ref 0.61–1.24)
GFR, Estimated: >60 mL/min (ref >60)
Glucose, Bld: 183 mg/dL — ABNORMAL HIGH (ref 70–99)
Potassium: 3.9 mmol/L (ref 3.5–5.1)
Sodium: 139 mmol/L (ref 135–145)
Total Bilirubin: 0.4 mg/dL (ref 0.3–1.2)
Total Protein: 6.2 g/dL — ABNORMAL LOW (ref 6.5–8.1)

## 2021-10-08 LAB — CBC WITH DIFFERENTIAL (CANCER CENTER ONLY)
Abs Immature Granulocytes: 0.04 10*3/uL (ref 0.00–0.07)
Basophils Absolute: 0 10*3/uL (ref 0.0–0.1)
Basophils Relative: 1 %
Eosinophils Absolute: 0 10*3/uL (ref 0.0–0.5)
Eosinophils Relative: 1 %
HCT: 36 % — ABNORMAL LOW (ref 39.0–52.0)
Hemoglobin: 11.9 g/dL — ABNORMAL LOW (ref 13.0–17.0)
Immature Granulocytes: 1 %
Lymphocytes Relative: 17 %
Lymphs Abs: 0.7 10*3/uL (ref 0.7–4.0)
MCH: 29.9 pg (ref 26.0–34.0)
MCHC: 33.1 g/dL (ref 30.0–36.0)
MCV: 90.5 fL (ref 80.0–100.0)
Monocytes Absolute: 0.6 10*3/uL (ref 0.1–1.0)
Monocytes Relative: 14 %
Neutro Abs: 2.6 10*3/uL (ref 1.7–7.7)
Neutrophils Relative %: 66 %
Platelet Count: 245 10*3/uL (ref 150–400)
RBC: 3.98 MIL/uL — ABNORMAL LOW (ref 4.22–5.81)
RDW: 15.5 % (ref 11.5–15.5)
WBC Count: 3.9 10*3/uL — ABNORMAL LOW (ref 4.0–10.5)
nRBC: 0 % (ref 0.0–0.2)

## 2021-10-08 MED ORDER — PALONOSETRON HCL INJECTION 0.25 MG/5ML
0.2500 mg | Freq: Once | INTRAVENOUS | Status: AC
  Administered 2021-10-08: 0.25 mg via INTRAVENOUS
  Filled 2021-10-08: qty 5

## 2021-10-08 MED ORDER — SODIUM CHLORIDE 0.9% FLUSH
10.0000 mL | Freq: Once | INTRAVENOUS | Status: AC
  Administered 2021-10-08: 10 mL

## 2021-10-08 MED ORDER — SODIUM CHLORIDE 0.9 % IV SOLN
10.0000 mg | Freq: Once | INTRAVENOUS | Status: AC
  Administered 2021-10-08: 10 mg via INTRAVENOUS
  Filled 2021-10-08: qty 10

## 2021-10-08 MED ORDER — SODIUM CHLORIDE 0.9 % IV SOLN
3.1300 mg/m2 | Freq: Once | INTRAVENOUS | Status: AC
  Administered 2021-10-08: 8 mg via INTRAVENOUS
  Filled 2021-10-08: qty 16

## 2021-10-08 MED ORDER — SODIUM CHLORIDE 0.9% FLUSH
10.0000 mL | INTRAVENOUS | Status: DC | PRN
  Administered 2021-10-08: 10 mL

## 2021-10-08 MED ORDER — SODIUM CHLORIDE 0.9 % IV SOLN: Freq: Once | INTRAVENOUS | Status: AC

## 2021-10-08 MED ORDER — HEPARIN SOD (PORK) LOCK FLUSH 100 UNIT/ML IV SOLN
500.0000 [IU] | Freq: Once | INTRAVENOUS | Status: AC | PRN
  Administered 2021-10-08: 500 [IU]

## 2021-10-08 NOTE — Progress Notes (Signed)
[facility] [phone]   Fax:([phone]  OFFICE PROGRESS NOTE  [name], NP [place] [address]  DIAGNOSIS: Relapsed extensive stage (T4, N3, M1c)  small cell lung cancer diagnosed in April 2021 and presented with large right upper lobe/right hilar mass with ipsilateral and contralateral mediastinal and right supraclavicular lymphadenopathy in addition to multiple liver lesios. He has disease progression after the first line of chemotherapy in December 2021.  PRIOR THERAPY:  1) Palliative radiotherapy to the right upper lobe lung mass under the care of Dr. Lisbeth Renshaw. 2) Systemic chemotherapy with carboplatin for AUC of 5 on day 1, etoposide 100 mg/M2 on days 1, 2 and 3 in addition to Imfinzi 1500 mg IV every 3 weeks with chemotherapy then every 4 weeks for maintenance if the patient has no evidence for progression.  He will also receive Cosela 240 mg/m2 on the days of the chemotherapy.  Status post 9 cycles.  Starting from cycle #5 the patient will be on maintenance treatment with immunotherapy with Imfinzi 1500 mg IV every 4 weeks. Last dose of chemotherapy was given on November 13, 2020. This treatment was discontinued secondary to disease progression. 3) Systemic chemotherapy with carboplatin for AUC of 5 on day 1, etoposide 100 mg/M2 on days 1, 2 and 3, Tecentriq 1200 mg IV every 3 weeks as well as Cosela 250 mg/M2 on the days of the chemotherapy every 3 weeks.  First dose December 18, 2020.  Status post 8 cycles.   CURRENT THERAPY: Zepzelca (lurbinectedin) 3.2 mgm2 IV every 3 weeks. First dose expected on 06/05/21. Status post 6 cycles.   INTERVAL HISTORY: Adam Wise 70 y.o. male returns to the clinic today for follow-up visit accompanied by his wife.  The patient is feeling fine today with no concerning complaints except for the baseline shortness of breath increased with exertion.  He denied having any current chest pain but has  mild cough with no hemoptysis.  He denied having any recent weight loss or night sweats.  He has no nausea, vomiting, diarrhea or constipation.  He has no headache or visual changes.  He continues to tolerate his treatment with Zepzelca (lurbinectedin) fairly well.  The patient had repeat CT scan of the chest, abdomen pelvis performed recently and he is here for evaluation and discussion of his scan results.   MEDICAL HISTORY: Past Medical History:  Diagnosis Date   Cancer [facility])    Chickenpox    Chronic knee pain    Chronic low back pain    Essential hypertension    GERD (gastroesophageal reflux disease)    OSA (obstructive sleep apnea)    Testosterone deficiency    Type 2 diabetes mellitus (HCC)     ALLERGIES:  is allergic to bupropion and hydrochlorothiazide.  MEDICATIONS:  Current Outpatient Medications  Medication Sig Dispense Refill   albuterol (VENTOLIN HFA) 108 (90 Base) MCG/ACT inhaler Inhale 2 puffs into the lungs every 6 (six) hours as needed for wheezing or shortness of breath. 18 g 0   amLODipine (NORVASC) 10 MG tablet TAKE 1 TABLET BY MOUTH EVERY DAY for blood pressure. 90 tablet 1   B-D 3CC LUER-LOK SYR 22GX1" 22G X 1" 3 ML MISC USE AS INSTRUCTED FOR TESTOSTERONE INJECTION EVERY 2 WEEKS 10 each 2   Black Pepper-Turmeric (TURMERIC COMPLEX/BLACK PEPPER PO) Take 1 tablet by mouth in the morning and at bedtime.     blood glucose meter kit and  supplies KIT Dispense based on patient and insurance preference. Use up to four times daily as directed. 1 each 0   chlorpheniramine-HYDROcodone (TUSSIONEX) 10-8 MG/5ML SUER Take 5 mLs by mouth at bedtime as needed for cough. 473 mL 0   Coenzyme Q10 ([employer] COQ-10) 200 MG capsule Take 200 mg by mouth every evening.      ELIQUIS 5 MG TABS tablet TAKE 1 TABLET BY MOUTH TWICE A DAY 180 tablet 2   esomeprazole (NEXIUM) 20 MG capsule Take 20 mg by mouth daily.      fluticasone (FLONASE) 50 MCG/ACT nasal spray Place 2 sprays into both nostrils  daily as needed for allergies.     gabapentin (NEURONTIN) 300 MG capsule Take 300 mg by mouth 2 (two) times daily.      glipiZIDE (GLUCOTROL) 10 MG tablet TAKE 1 TABLET (10 MG TOTAL) BY MOUTH 2 (TWO) TIMES DAILY BEFORE A MEAL. FOR DIABETES. 180 tablet 1   Insulin Glargine (BASAGLAR KWIKPEN) 100 UNIT/ML Inject 20 Units into the skin at bedtime. For diabetes. (Patient not taking: Reported on 08/13/2021) 15 mL 3   Insulin Pen Needle (BD PEN NEEDLE NANO U/F) 32G X 4 MM MISC Use with insulin as prescribed  Dx Code: E11.9 100 each 2   KRILL OIL PO Take 350 mg by mouth daily.     Lancets MISC USE UP TO 3 TIMES DAILY AS DIRECTED 100 each 2   lidocaine-prilocaine (EMLA) cream Apply 1 application topically as needed. 30 g 2   losartan (COZAAR) 25 MG tablet Take 1 tablet (25 mg total) by mouth daily. For blood pressure. 90 tablet 0   Melatonin 10 MG CAPS Take 10 mg by mouth at bedtime.     metFORMIN (GLUCOPHAGE) 1000 MG tablet TAKE 1 TABLET (1,000 MG TOTAL) BY MOUTH 2 (TWO) TIMES DAILY WITH A MEAL. FOR DIABETES 180 tablet 1   Multiple Vitamin (MULTI-VITAMINS) TABS Take 1 tablet by mouth daily.      ONETOUCH ULTRA test strip USE UP TO 4 TIMES DAILY AS DIRECTED 100 strip 5   OVER THE COUNTER MEDICATION Take by mouth.     oxymetazoline (AFRIN) 0.05 % nasal spray Place 1 spray into both nostrils 2 (two) times daily as needed for congestion.     pioglitazone (ACTOS) 45 MG tablet TAKE 1 TABLET (45 MG TOTAL) BY MOUTH DAILY. FOR DIABETES. 90 tablet 0   pravastatin (PRAVACHOL) 40 MG tablet TAKE 1 TABLET BY MOUTH EVERY DAY IN THE EVENING FOR CHOLESTEROL 90 tablet 1   prochlorperazine (COMPAZINE) 10 MG tablet Take 1 tablet (10 mg total) by mouth every 6 (six) hours as needed for nausea or vomiting. 30 tablet 0   sildenafil (REVATIO) 20 MG tablet Take 3-5 tablets by mouth 1 hour prior to intercourse as needed. 50 tablet 0   testosterone cypionate (DEPOTESTOSTERONE CYPIONATE) 200 MG/ML injection Inject 200 mg into the  muscle every 14 (fourteen) days.     No current facility-administered medications for this visit.    SURGICAL HISTORY:  Past Surgical History:  Procedure Laterality Date   COLONOSCOPY WITH PROPOFOL N/A 12/17/2018   Procedure: COLONOSCOPY WITH PROPOFOL;  Surgeon: Virgel Manifold, MD;  Location: [facility] ENDOSCOPY;  Service: Gastroenterology;  Laterality: N/A;   IR IMAGING GUIDED PORT INSERTION  04/17/2020   JOINT REPLACEMENT Bilateral    REPLACEMENT TOTAL KNEE BILATERAL  2015   TONSILLECTOMY  1960    REVIEW OF SYSTEMS:  Constitutional: positive for fatigue Eyes: negative Ears, nose, mouth, throat,  and face: negative Respiratory: positive for cough and dyspnea on exertion Cardiovascular: negative Gastrointestinal: negative Genitourinary:negative Integument/breast: negative Hematologic/lymphatic: negative Musculoskeletal:negative Neurological: negative Behavioral/Psych: negative Endocrine: negative Allergic/Immunologic: negative   PHYSICAL EXAMINATION: General appearance: alert, cooperative, fatigued, and no distress Head: atraumatic Neck: no adenopathy, no JVD, supple, symmetrical, trachea midline, and thyroid not enlarged, symmetric, no tenderness/mass/nodules Lymph nodes: Cervical, supraclavicular, and axillary nodes normal. Resp: diminished breath sounds RLL and dullness to percussion RLL Back: symmetric, no curvature. ROM normal. No CVA tenderness. Cardio: regular rate and rhythm, S1, S2 normal, no murmur, click, rub or gallop GI: soft, non-tender; bowel sounds normal; no masses,  no organomegaly Extremities: extremities normal, atraumatic, no cyanosis or edema Neurologic: Alert and oriented X 3, normal strength and tone. Normal symmetric reflexes. Normal coordination and gait  ECOG PERFORMANCE STATUS: 1 - Symptomatic but completely ambulatory  Blood pressure (!) 142/97, pulse (!) 102, temperature (!) 96.9 F (36.1 C), temperature source Tympanic, resp. rate 18, weight 291  lb 11.2 oz (132.3 kg), SpO2 96 %.  LABORATORY DATA: Lab Results  Component Value Date   WBC 3.9 (L) 10/08/2021   HGB 11.9 (L) 10/08/2021   HCT 36.0 (L) 10/08/2021   MCV 90.5 10/08/2021   PLT 245 10/08/2021      Chemistry      Component Value Date/Time   NA 139 09/17/2021 1022   K 4.3 09/17/2021 1022   CL 105 09/17/2021 1022   CO2 24 09/17/2021 1022   BUN 14 09/17/2021 1022   CREATININE 1.23 09/17/2021 1022      Component Value Date/Time   CALCIUM 8.9 09/17/2021 1022   ALKPHOS 71 09/17/2021 1022   AST 20 09/17/2021 1022   ALT 22 09/17/2021 1022   BILITOT 0.4 09/17/2021 1022       RADIOGRAPHIC STUDIES: DG Chest 2 View  Result Date: 09/17/2021 CLINICAL DATA:  History of small cell lung cancer EXAM: CHEST - 2 VIEW COMPARISON:  Chest x-ray dated August 12, 2021 FINDINGS: Right chest wall port with tip projecting over the expected area of the cavoatrial junction. Visualized cardiac and mediastinal contours are unchanged. Paramediastinal opacities of the right lung, unchanged compared to prior and likely post treatment change. Left lung is clear. Unchanged elevation of the right hemidiaphragm. Small amount of apical pleural fluid. IMPRESSION: Stable chest x-ray when compared with most recent prior. Electronically Signed   By: Yetta Glassman M.D.   On: 09/17/2021 14:58   CT Chest W Contrast  Result Date: 10/07/2021 CLINICAL DATA:  Metastatic lung cancer restaging EXAM: CT CHEST, ABDOMEN, AND PELVIS WITH CONTRAST TECHNIQUE: Multidetector CT imaging of the chest, abdomen and pelvis was performed following the standard protocol during bolus administration of intravenous contrast. CONTRAST:  82mL OMNIPAQUE IOHEXOL 350 MG/ML SOLN, additional oral enteric contrast COMPARISON:  08/07/2021, 05/16/2021 FINDINGS: CT CHEST FINDINGS Cardiovascular: Right chest port catheter. Aortic atherosclerosis. Normal heart size. Three-vessel coronary artery calcifications. No pericardial effusion.  Mediastinum/Nodes: Unchanged posttreatment appearance of soft tissue about the right hilum (series 2, image 25). No discretely enlarged mediastinal, hilar, or axillary lymph nodes. Thyroid gland, trachea, and esophagus demonstrate no significant findings. Lungs/Pleura: Unchanged posttreatment/postradiation appearance of the right lung with perihilar and suprahilar fibrosis and consolidation. Moderate right pleural effusion, not significantly changed compared to prior examination. Occasional tiny pulmonary nodules are unchanged, for example a 2 mm nodule in the superior segment left lower lobe (series 4, image 70) and a 2 mm nodule in the peripheral left upper lobe (series 4, image 45).  Musculoskeletal: No chest wall mass or suspicious bone lesions identified. CT ABDOMEN PELVIS FINDINGS Hepatobiliary: Multiple small hypodense liver lesions are unchanged, for example a subcapsular lesion of the anterior right lobe of the liver measuring 1.7 x 0.9 cm (series 2, image 59) and a lesion of the inferior left lobe measuring 1.2 x 1.0 cm (series 3, image 54). No gallstones, gallbladder wall thickening, or biliary dilatation. Pancreas: Unremarkable. No pancreatic ductal dilatation or surrounding inflammatory changes. Spleen: Normal in size without significant abnormality. Adrenals/Urinary Tract: Unchanged nodule of the right adrenal gland measuring 1.7 x 1.5 cm (series 2, image 57). Exophytic simple cysts in the right kidney. Kidneys are otherwise normal, without renal calculi, solid lesion, or hydronephrosis. Thickening of the urinary bladder multiple likely related to chronic outlet obstruction. Stomach/Bowel: Stomach is within normal limits. Appendix appears normal. No evidence of bowel wall thickening, distention, or inflammatory changes. Sigmoid diverticula. Vascular/Lymphatic: Aortic atherosclerosis. No enlarged abdominal or pelvic lymph nodes. Reproductive: Mild prostatomegaly. Other: No abdominal wall hernia or  abnormality. No abdominopelvic ascites. Musculoskeletal: No acute or significant osseous findings. Chronic bilateral pars defects of L5. IMPRESSION: 1. Unchanged posttreatment/postradiation appearance of the right lung with perihilar and suprahilar fibrosis and consolidation. Moderate right pleural effusion, not significantly changed compared to prior examination. 2. Multiple small hypodense liver lesions are unchanged, consistent with stable hepatic metastatic disease. 3. Occasional tiny pulmonary nodules are unchanged, measuring 2 mm and smaller, almost certainly benign and incidental, continued attention on follow-up. 4. Unchanged nodule of the right adrenal gland measuring 1.7 x 1.5 cm, which remains nonspecific but likely a small, incidental adenoma. Continued attention on follow-up. 5. No evidence of new metastatic disease in the chest, abdomen, or pelvis. 6. Prostatomegaly. 7. Coronary artery disease. Aortic Atherosclerosis (ICD10-I70.0). Electronically Signed   By: Delanna Ahmadi M.D.   On: 10/07/2021 11:14   CT Abdomen Pelvis W Contrast  Result Date: 10/07/2021 CLINICAL DATA:  Metastatic lung cancer restaging EXAM: CT CHEST, ABDOMEN, AND PELVIS WITH CONTRAST TECHNIQUE: Multidetector CT imaging of the chest, abdomen and pelvis was performed following the standard protocol during bolus administration of intravenous contrast. CONTRAST:  53mL OMNIPAQUE IOHEXOL 350 MG/ML SOLN, additional oral enteric contrast COMPARISON:  08/07/2021, 05/16/2021 FINDINGS: CT CHEST FINDINGS Cardiovascular: Right chest port catheter. Aortic atherosclerosis. Normal heart size. Three-vessel coronary artery calcifications. No pericardial effusion. Mediastinum/Nodes: Unchanged posttreatment appearance of soft tissue about the right hilum (series 2, image 25). No discretely enlarged mediastinal, hilar, or axillary lymph nodes. Thyroid gland, trachea, and esophagus demonstrate no significant findings. Lungs/Pleura: Unchanged  posttreatment/postradiation appearance of the right lung with perihilar and suprahilar fibrosis and consolidation. Moderate right pleural effusion, not significantly changed compared to prior examination. Occasional tiny pulmonary nodules are unchanged, for example a 2 mm nodule in the superior segment left lower lobe (series 4, image 70) and a 2 mm nodule in the peripheral left upper lobe (series 4, image 45). Musculoskeletal: No chest wall mass or suspicious bone lesions identified. CT ABDOMEN PELVIS FINDINGS Hepatobiliary: Multiple small hypodense liver lesions are unchanged, for example a subcapsular lesion of the anterior right lobe of the liver measuring 1.7 x 0.9 cm (series 2, image 59) and a lesion of the inferior left lobe measuring 1.2 x 1.0 cm (series 3, image 54). No gallstones, gallbladder wall thickening, or biliary dilatation. Pancreas: Unremarkable. No pancreatic ductal dilatation or surrounding inflammatory changes. Spleen: Normal in size without significant abnormality. Adrenals/Urinary Tract: Unchanged nodule of the right adrenal gland measuring 1.7 x 1.5 cm (series  2, image 57). Exophytic simple cysts in the right kidney. Kidneys are otherwise normal, without renal calculi, solid lesion, or hydronephrosis. Thickening of the urinary bladder multiple likely related to chronic outlet obstruction. Stomach/Bowel: Stomach is within normal limits. Appendix appears normal. No evidence of bowel wall thickening, distention, or inflammatory changes. Sigmoid diverticula. Vascular/Lymphatic: Aortic atherosclerosis. No enlarged abdominal or pelvic lymph nodes. Reproductive: Mild prostatomegaly. Other: No abdominal wall hernia or abnormality. No abdominopelvic ascites. Musculoskeletal: No acute or significant osseous findings. Chronic bilateral pars defects of L5. IMPRESSION: 1. Unchanged posttreatment/postradiation appearance of the right lung with perihilar and suprahilar fibrosis and consolidation. Moderate  right pleural effusion, not significantly changed compared to prior examination. 2. Multiple small hypodense liver lesions are unchanged, consistent with stable hepatic metastatic disease. 3. Occasional tiny pulmonary nodules are unchanged, measuring 2 mm and smaller, almost certainly benign and incidental, continued attention on follow-up. 4. Unchanged nodule of the right adrenal gland measuring 1.7 x 1.5 cm, which remains nonspecific but likely a small, incidental adenoma. Continued attention on follow-up. 5. No evidence of new metastatic disease in the chest, abdomen, or pelvis. 6. Prostatomegaly. 7. Coronary artery disease. Aortic Atherosclerosis (ICD10-I70.0). Electronically Signed   By: Delanna Ahmadi M.D.   On: 10/07/2021 11:14    ASSESSMENT AND PLAN: This is a very pleasant 70 years old white male recently diagnosed with extensive stage (T4, N3, M1 C) small cell lung cancer presented with large right upper lobe lung mass in addition to mediastinal and right supraclavicular lymphadenopathy and multiple metastatic liver lesions diagnosed in April 2021. The patient underwent systemic chemotherapy with carboplatin for AUC of 5 on day 1, etoposide 100 mg/M2 on days 1, 2 and 3 in addition to Cosela for myeloprotection during the days of the chemotherapy.  He will also receive immunotherapy with Imfinzi on day one of the chemotherapy cycle.  Status post 9 cycles.  Starting from cycle #5 he is on maintenance treatment with Imfinzi 1500 mg IV every 4 weeks. The patient has been tolerating this treatment well with no concerning adverse effect except for the worsening cough and shortness of breath recently.   He had repeat CT scan of the chest, abdomen pelvis performed recently.  I personally and independently reviewed the scan images and discussed the results with the patient and his wife today. Unfortunately his scan showed evidence for disease progression with increase in the right pleural effusion as well as  enlargement of multiple liver lesions. I recommended for the patient to discontinue his current maintenance treatment with Imfinzi at this point. He resumed systemic chemotherapy with carboplatin for AUC of 5 on day 1, etoposide 100 mg/M2 on days 1, 2 and 3 with Tecentriq 1200 mg every 3 weeks as well as Cosela on the days of the chemotherapy.  He is status post 8 cycles.  This treatment was discontinued secondary to disease progression. The patient is currently undergoing third line treatment with systemic chemotherapy with Zepzelca (lurbinectedin) 3.2 Mg/KG every 3 weeks status post 6 cycles.  The patient has been tolerating his treatment with Zepzelca (lurbinectedin) fairly well with no concerning adverse effect except for fatigue.  He also has baseline shortness of breath secondary to COPD as well as his lung cancer. He had repeat CT scan of the chest, abdomen pelvis performed recently.  I personally and independently reviewed the scans and discussed the results with the patient and his wife. His scan showed a stable disease with no concerning findings for progression. I recommended for  him to continue his current treatment with Zepzelca (lurbinectedin) with the same dose. I will see him back for follow-up visit in 3 weeks for evaluation before starting cycle #8. For the hypertension, he will continue to monitor it closely and discussed with his primary care physician for adjustment of his medication if needed. The patient was advised to call immediately if he has any other concerning symptoms in the interval. The patient voices understanding of current disease status and treatment options and is in agreement with the current care plan.  All questions were answered. The patient knows to call the clinic with any problems, questions or concerns. We can certainly see the patient much sooner if necessary.  Disclaimer: This note was dictated with voice recognition software. Similar sounding words can  inadvertently be transcribed and may not be corrected upon review.

## 2021-10-08 NOTE — Patient Instructions (Signed)
[facility]  Discharge Instructions: Thank you for choosing [facility] to provide your oncology and hematology care.   If you have a lab appointment with the [facility], please go directly to the [facility] and check in at the registration area.   Wear comfortable clothing and clothing appropriate for easy access to any Portacath or PICC line.   We strive to give you quality time with your provider. You may need to reschedule your appointment if you arrive late (15 or more minutes).  Arriving late affects you and other patients whose appointments are after yours.  Also, if you miss three or more appointments without notifying the office, you may be dismissed from the clinic at the provider's discretion.      For prescription refill requests, have your pharmacy contact our office and allow 72 hours for refills to be completed.    Today you received the following chemotherapy and/or immunotherapy agents: Zepzelca      To help prevent nausea and vomiting after your treatment, we encourage you to take your nausea medication as directed.  BELOW ARE SYMPTOMS THAT SHOULD BE REPORTED IMMEDIATELY: *FEVER GREATER THAN 100.4 F (38 C) OR HIGHER *CHILLS OR SWEATING *NAUSEA AND VOMITING THAT IS NOT CONTROLLED WITH YOUR NAUSEA MEDICATION *UNUSUAL SHORTNESS OF BREATH *UNUSUAL BRUISING OR BLEEDING *URINARY PROBLEMS (pain or burning when urinating, or frequent urination) *BOWEL PROBLEMS (unusual diarrhea, constipation, pain near the anus) TENDERNESS IN MOUTH AND THROAT WITH OR WITHOUT PRESENCE OF ULCERS (sore throat, sores in mouth, or a toothache) UNUSUAL RASH, SWELLING OR PAIN  UNUSUAL VAGINAL DISCHARGE OR ITCHING   Items with * indicate a potential emergency and should be followed up as soon as possible or go to the Emergency Department if any problems should occur.  Please show the CHEMOTHERAPY ALERT CARD or IMMUNOTHERAPY ALERT CARD at check-in to  the Emergency Department and triage nurse.  Should you have questions after your visit or need to cancel or reschedule your appointment, please contact [facility]  Dept: [phone]  and follow the prompts.  Office hours are 8:00 a.m. to 4:30 p.m. Monday - Friday. Please note that voicemails left after 4:00 p.m. may not be returned until the following business day.  We are closed weekends and major holidays. You have access to a nurse at all times for urgent questions. Please call the main number to the clinic Dept: [phone] and follow the prompts.   For any non-urgent questions, you may also contact your provider using MyChart. We now offer e-Visits for anyone 97 and older to request care online for non-urgent symptoms. For details visit [email or web address].   Also download the MyChart app! Go to the app store, search "MyChart", open the app, select [facility], and log in with your MyChart username and password.  Due to Covid, a mask is required upon entering the hospital/clinic. If you do not have a mask, one will be given to you upon arrival. For doctor visits, patients may have 1 support person aged 86 or older with them. For treatment visits, patients cannot have anyone with them due to current Covid guidelines and our immunocompromised population.

## 2021-10-08 NOTE — Progress Notes (Signed)
I spoke to Mr and Ms Raulston today.  He is doing ok but is having some breathing difficulty.  Dr. Julien Nordmann suggested that they call the pulmonologist for management as well as PCP for hypertension.  According to patient PCP has been working to change his BP meds.  I reinforced Dr. Worthy Flank recommendations.

## 2021-10-09 ENCOUNTER — Encounter: Payer: Self-pay | Admitting: Internal Medicine

## 2021-10-09 NOTE — Telephone Encounter (Signed)
I spoke with pts wife and advised the RN's name is Hinton Dyer and shared some of what she does as a Engineer, site. She thanked me for the call and indicated she wasn't sure who the woman was but thought she was very nice.

## 2021-10-11 ENCOUNTER — Inpatient Hospital Stay (HOSPITAL_COMMUNITY)
Admission: EM | Admit: 2021-10-11 | Discharge: 2021-10-13 | DRG: 190 | Disposition: A | Discharge status: Home / Self Care | Payer: Medicare Other | Attending: Family Medicine | Admitting: Family Medicine

## 2021-10-11 ENCOUNTER — Encounter: Payer: Self-pay | Admitting: Internal Medicine

## 2021-10-11 ENCOUNTER — Emergency Department (HOSPITAL_COMMUNITY): Payer: Medicare Other

## 2021-10-11 ENCOUNTER — Encounter (HOSPITAL_COMMUNITY): Payer: Self-pay

## 2021-10-11 ENCOUNTER — Other Ambulatory Visit: Payer: Self-pay

## 2021-10-11 DIAGNOSIS — G8929 Other chronic pain: Secondary | ICD-10-CM | POA: Diagnosis present

## 2021-10-11 DIAGNOSIS — C787 Secondary malignant neoplasm of liver and intrahepatic bile duct: Secondary | ICD-10-CM | POA: Diagnosis not present

## 2021-10-11 DIAGNOSIS — R0600 Dyspnea, unspecified: Secondary | ICD-10-CM | POA: Diagnosis not present

## 2021-10-11 DIAGNOSIS — C3411 Malignant neoplasm of upper lobe, right bronchus or lung: Secondary | ICD-10-CM | POA: Diagnosis not present

## 2021-10-11 DIAGNOSIS — Z6841 Body Mass Index (BMI) 40.0 and over, adult: Secondary | ICD-10-CM

## 2021-10-11 DIAGNOSIS — J90 Pleural effusion, not elsewhere classified: Secondary | ICD-10-CM | POA: Diagnosis present

## 2021-10-11 DIAGNOSIS — Z7901 Long term (current) use of anticoagulants: Secondary | ICD-10-CM

## 2021-10-11 DIAGNOSIS — J9621 Acute and chronic respiratory failure with hypoxia: Secondary | ICD-10-CM

## 2021-10-11 DIAGNOSIS — Z9221 Personal history of antineoplastic chemotherapy: Secondary | ICD-10-CM

## 2021-10-11 DIAGNOSIS — R062 Wheezing: Secondary | ICD-10-CM | POA: Diagnosis not present

## 2021-10-11 DIAGNOSIS — Z833 Family history of diabetes mellitus: Secondary | ICD-10-CM

## 2021-10-11 DIAGNOSIS — J441 Chronic obstructive pulmonary disease with (acute) exacerbation: Secondary | ICD-10-CM

## 2021-10-11 DIAGNOSIS — Z794 Long term (current) use of insulin: Secondary | ICD-10-CM

## 2021-10-11 DIAGNOSIS — Z96653 Presence of artificial knee joint, bilateral: Secondary | ICD-10-CM | POA: Diagnosis present

## 2021-10-11 DIAGNOSIS — G4733 Obstructive sleep apnea (adult) (pediatric): Secondary | ICD-10-CM | POA: Diagnosis present

## 2021-10-11 DIAGNOSIS — I700 Atherosclerosis of aorta: Secondary | ICD-10-CM | POA: Diagnosis present

## 2021-10-11 DIAGNOSIS — J189 Pneumonia, unspecified organism: Secondary | ICD-10-CM | POA: Diagnosis present

## 2021-10-11 DIAGNOSIS — Z825 Family history of asthma and other chronic lower respiratory diseases: Secondary | ICD-10-CM

## 2021-10-11 DIAGNOSIS — Z79899 Other long term (current) drug therapy: Secondary | ICD-10-CM

## 2021-10-11 DIAGNOSIS — Z86711 Personal history of pulmonary embolism: Secondary | ICD-10-CM

## 2021-10-11 DIAGNOSIS — K219 Gastro-esophageal reflux disease without esophagitis: Secondary | ICD-10-CM | POA: Diagnosis present

## 2021-10-11 DIAGNOSIS — I2510 Atherosclerotic heart disease of native coronary artery without angina pectoris: Secondary | ICD-10-CM | POA: Diagnosis present

## 2021-10-11 DIAGNOSIS — Z9889 Other specified postprocedural states: Secondary | ICD-10-CM

## 2021-10-11 DIAGNOSIS — Z7984 Long term (current) use of oral hypoglycemic drugs: Secondary | ICD-10-CM

## 2021-10-11 DIAGNOSIS — Z8249 Family history of ischemic heart disease and other diseases of the circulatory system: Secondary | ICD-10-CM

## 2021-10-11 DIAGNOSIS — I959 Hypotension, unspecified: Secondary | ICD-10-CM | POA: Diagnosis not present

## 2021-10-11 DIAGNOSIS — Z20822 Contact with and (suspected) exposure to covid-19: Secondary | ICD-10-CM | POA: Diagnosis not present

## 2021-10-11 DIAGNOSIS — E119 Type 2 diabetes mellitus without complications: Secondary | ICD-10-CM

## 2021-10-11 DIAGNOSIS — Z8261 Family history of arthritis: Secondary | ICD-10-CM

## 2021-10-11 DIAGNOSIS — R0602 Shortness of breath: Secondary | ICD-10-CM | POA: Diagnosis not present

## 2021-10-11 DIAGNOSIS — Z87891 Personal history of nicotine dependence: Secondary | ICD-10-CM

## 2021-10-11 DIAGNOSIS — E785 Hyperlipidemia, unspecified: Secondary | ICD-10-CM | POA: Diagnosis present

## 2021-10-11 DIAGNOSIS — Z888 Allergy status to other drugs, medicaments and biological substances status: Secondary | ICD-10-CM

## 2021-10-11 DIAGNOSIS — I10 Essential (primary) hypertension: Secondary | ICD-10-CM | POA: Diagnosis not present

## 2021-10-11 DIAGNOSIS — R0902 Hypoxemia: Secondary | ICD-10-CM | POA: Diagnosis not present

## 2021-10-11 DIAGNOSIS — J440 Chronic obstructive pulmonary disease with acute lower respiratory infection: Secondary | ICD-10-CM | POA: Diagnosis present

## 2021-10-11 HISTORY — DX: Chronic obstructive pulmonary disease with (acute) exacerbation: J44.1

## 2021-10-11 LAB — CBC WITH DIFFERENTIAL/PLATELET
Abs Immature Granulocytes: 0.03 10*3/uL (ref 0.00–0.07)
Basophils Absolute: 0 10*3/uL (ref 0.0–0.1)
Basophils Relative: 1 %
Eosinophils Absolute: 0 10*3/uL (ref 0.0–0.5)
Eosinophils Relative: 0 %
HCT: 36.3 % — ABNORMAL LOW (ref 39.0–52.0)
Hemoglobin: 11.6 g/dL — ABNORMAL LOW (ref 13.0–17.0)
Immature Granulocytes: 0 %
Lymphocytes Relative: 7 %
Lymphs Abs: 0.5 10*3/uL — ABNORMAL LOW (ref 0.7–4.0)
MCH: 30.1 pg (ref 26.0–34.0)
MCHC: 32 g/dL (ref 30.0–36.0)
MCV: 94.3 fL (ref 80.0–100.0)
Monocytes Absolute: 0.2 10*3/uL (ref 0.1–1.0)
Monocytes Relative: 3 %
Neutro Abs: 6.2 10*3/uL (ref 1.7–7.7)
Neutrophils Relative %: 89 %
Platelets: 243 10*3/uL (ref 150–400)
RBC: 3.85 MIL/uL — ABNORMAL LOW (ref 4.22–5.81)
RDW: 15.6 % — ABNORMAL HIGH (ref 11.5–15.5)
WBC: 7 10*3/uL (ref 4.0–10.5)
nRBC: 0 % (ref 0.0–0.2)

## 2021-10-11 LAB — COMPREHENSIVE METABOLIC PANEL
ALT: 69 U/L — ABNORMAL HIGH (ref 0–44)
AST: 57 U/L — ABNORMAL HIGH (ref 15–41)
Albumin: 3.9 g/dL (ref 3.5–5.0)
Alkaline Phosphatase: 45 U/L (ref 38–126)
Anion gap: 8 (ref 5–15)
BUN: 26 mg/dL — ABNORMAL HIGH (ref 8–23)
CO2: 28 mmol/L (ref 22–32)
Calcium: 8 mg/dL — ABNORMAL LOW (ref 8.9–10.3)
Chloride: 103 mmol/L (ref 98–111)
Creatinine, Ser: 1.3 mg/dL — ABNORMAL HIGH (ref 0.61–1.24)
GFR, Estimated: 59 mL/min — ABNORMAL LOW (ref >60)
Glucose, Bld: 197 mg/dL — ABNORMAL HIGH (ref 70–99)
Potassium: 4 mmol/L (ref 3.5–5.1)
Sodium: 139 mmol/L (ref 135–145)
Total Bilirubin: 0.9 mg/dL (ref 0.3–1.2)
Total Protein: 6.6 g/dL (ref 6.5–8.1)

## 2021-10-11 LAB — LACTIC ACID, PLASMA: Lactic Acid, Venous: 2.1 mmol/L (ref 0.5–1.9)

## 2021-10-11 LAB — RESP PANEL BY RT-PCR (FLU A&B, COVID) ARPGX2
Influenza A by PCR: NEGATIVE
Influenza B by PCR: NEGATIVE
SARS Coronavirus 2 by RT PCR: NEGATIVE

## 2021-10-11 LAB — BRAIN NATRIURETIC PEPTIDE: B Natriuretic Peptide: 104.8 pg/mL — ABNORMAL HIGH (ref 0.0–100.0)

## 2021-10-11 LAB — TROPONIN I (HIGH SENSITIVITY)
Troponin I (High Sensitivity): 15 ng/L (ref <18)
Troponin I (High Sensitivity): 16 ng/L (ref <18)

## 2021-10-11 LAB — PROTIME-INR
INR: 1 (ref 0.8–1.2)
Prothrombin Time: 13.2 seconds (ref 11.4–15.2)

## 2021-10-11 LAB — CBG MONITORING, ED: Glucose-Capillary: 251 mg/dL — ABNORMAL HIGH (ref 70–99)

## 2021-10-11 LAB — D-DIMER, QUANTITATIVE: D-Dimer, Quant: 1.63 ug/mL-FEU — ABNORMAL HIGH (ref 0.00–0.50)

## 2021-10-11 MED ORDER — PREDNISONE 20 MG PO TABS
40.0000 mg | ORAL_TABLET | Freq: Every day | ORAL | Status: DC
  Administered 2021-10-12 – 2021-10-13 (×2): 40 mg via ORAL
  Filled 2021-10-11 (×2): qty 2

## 2021-10-11 MED ORDER — METHYLPREDNISOLONE SODIUM SUCC 125 MG IJ SOLR
120.0000 mg | INTRAMUSCULAR | Status: AC
  Administered 2021-10-11: 120 mg via INTRAVENOUS
  Filled 2021-10-11: qty 2

## 2021-10-11 MED ORDER — POLYETHYLENE GLYCOL 3350 17 G PO PACK: 17.0000 g | PACK | Freq: Every day | ORAL | Status: DC | PRN

## 2021-10-11 MED ORDER — ADULT MULTIVITAMIN W/MINERALS CH: 1.0000 | ORAL_TABLET | Freq: Every day | ORAL | Status: DC

## 2021-10-11 MED ORDER — GABAPENTIN 300 MG PO CAPS
300.0000 mg | ORAL_CAPSULE | Freq: Every day | ORAL | Status: DC
  Administered 2021-10-11 – 2021-10-12 (×2): 300 mg via ORAL
  Filled 2021-10-11 (×2): qty 1

## 2021-10-11 MED ORDER — ONDANSETRON HCL 4 MG/2ML IJ SOLN: 4.0000 mg | Freq: Four times a day (QID) | INTRAMUSCULAR | Status: DC | PRN

## 2021-10-11 MED ORDER — BISACODYL 5 MG PO TBEC: 5.0000 mg | DELAYED_RELEASE_TABLET | Freq: Every day | ORAL | Status: DC | PRN

## 2021-10-11 MED ORDER — GUAIFENESIN ER 600 MG PO TB12
600.0000 mg | ORAL_TABLET | Freq: Two times a day (BID) | ORAL | Status: DC
  Administered 2021-10-11 – 2021-10-13 (×4): 600 mg via ORAL
  Filled 2021-10-11 (×4): qty 1

## 2021-10-11 MED ORDER — IPRATROPIUM-ALBUTEROL 0.5-2.5 (3) MG/3ML IN SOLN: 3.0000 mL | Freq: Four times a day (QID) | RESPIRATORY_TRACT | Status: DC

## 2021-10-11 MED ORDER — IPRATROPIUM-ALBUTEROL 0.5-2.5 (3) MG/3ML IN SOLN: 3.0000 mL | Freq: Three times a day (TID) | RESPIRATORY_TRACT | Status: DC

## 2021-10-11 MED ORDER — INSULIN ASPART 100 UNIT/ML IJ SOLN
0.0000 [IU] | Freq: Three times a day (TID) | INTRAMUSCULAR | Status: DC
  Administered 2021-10-12 (×2): 5 [IU] via SUBCUTANEOUS
  Administered 2021-10-12: 8 [IU] via SUBCUTANEOUS
  Administered 2021-10-13: 2 [IU] via SUBCUTANEOUS
  Administered 2021-10-13: 5 [IU] via SUBCUTANEOUS
  Filled 2021-10-11: qty 0.15

## 2021-10-11 MED ORDER — IOHEXOL 350 MG/ML SOLN
100.0000 mL | Freq: Once | INTRAVENOUS | Status: AC | PRN
  Administered 2021-10-11: 80 mL via INTRAVENOUS

## 2021-10-11 MED ORDER — INSULIN GLARGINE-YFGN 100 UNIT/ML ~~LOC~~ SOLN
20.0000 [IU] | Freq: Every day | SUBCUTANEOUS | Status: DC
  Administered 2021-10-11 – 2021-10-12 (×2): 20 [IU] via SUBCUTANEOUS
  Filled 2021-10-11 (×3): qty 0.2

## 2021-10-11 MED ORDER — HYDROCOD POLST-CPM POLST ER 10-8 MG/5ML PO SUER: 5.0000 mL | Freq: Every evening | ORAL | Status: DC | PRN

## 2021-10-11 MED ORDER — APIXABAN 5 MG PO TABS
5.0000 mg | ORAL_TABLET | Freq: Two times a day (BID) | ORAL | Status: DC
  Administered 2021-10-11 – 2021-10-13 (×4): 5 mg via ORAL
  Filled 2021-10-11 (×5): qty 1

## 2021-10-11 MED ORDER — DOCUSATE SODIUM 100 MG PO CAPS
100.0000 mg | ORAL_CAPSULE | Freq: Two times a day (BID) | ORAL | Status: DC
  Administered 2021-10-11 – 2021-10-13 (×4): 100 mg via ORAL
  Filled 2021-10-11 (×4): qty 1

## 2021-10-11 MED ORDER — IPRATROPIUM-ALBUTEROL 0.5-2.5 (3) MG/3ML IN SOLN
3.0000 mL | Freq: Three times a day (TID) | RESPIRATORY_TRACT | Status: DC
  Administered 2021-10-12 – 2021-10-13 (×3): 3 mL via RESPIRATORY_TRACT
  Filled 2021-10-11 (×3): qty 3

## 2021-10-11 MED ORDER — ADULT MULTIVITAMIN W/MINERALS CH
1.0000 | ORAL_TABLET | Freq: Every day | ORAL | Status: DC
  Administered 2021-10-12 – 2021-10-13 (×2): 1 via ORAL
  Filled 2021-10-11 (×2): qty 1

## 2021-10-11 MED ORDER — BASAGLAR KWIKPEN 100 UNIT/ML ~~LOC~~ SOPN
20.0000 [IU] | PEN_INJECTOR | Freq: Every day | SUBCUTANEOUS | Status: DC
  Filled 2021-10-11: qty 3

## 2021-10-11 MED ORDER — SODIUM CHLORIDE 0.9 % IV SOLN
500.0000 mg | INTRAVENOUS | Status: DC
  Administered 2021-10-11 – 2021-10-12 (×2): 500 mg via INTRAVENOUS
  Filled 2021-10-11 (×2): qty 500

## 2021-10-11 MED ORDER — ALBUTEROL SULFATE (2.5 MG/3ML) 0.083% IN NEBU
2.5000 mg | INHALATION_SOLUTION | RESPIRATORY_TRACT | Status: DC | PRN
  Administered 2021-10-12: 2.5 mg via RESPIRATORY_TRACT
  Filled 2021-10-11: qty 3

## 2021-10-11 MED ORDER — MELATONIN 5 MG PO TABS
10.0000 mg | ORAL_TABLET | Freq: Every day | ORAL | Status: DC
  Administered 2021-10-11 – 2021-10-12 (×2): 10 mg via ORAL
  Filled 2021-10-11 (×2): qty 2

## 2021-10-11 MED ORDER — INSULIN ASPART 100 UNIT/ML IJ SOLN
0.0000 [IU] | Freq: Every day | INTRAMUSCULAR | Status: DC
  Administered 2021-10-11: 3 [IU] via SUBCUTANEOUS
  Filled 2021-10-11: qty 0.05

## 2021-10-11 MED ORDER — SODIUM CHLORIDE 0.9 % IV SOLN
2.0000 g | INTRAVENOUS | Status: DC
  Administered 2021-10-11 – 2021-10-12 (×2): 2 g via INTRAVENOUS
  Filled 2021-10-11 (×3): qty 20

## 2021-10-11 MED ORDER — ONDANSETRON HCL 4 MG PO TABS: 4.0000 mg | ORAL_TABLET | Freq: Four times a day (QID) | ORAL | Status: DC | PRN

## 2021-10-11 MED ORDER — AMLODIPINE BESYLATE 10 MG PO TABS
10.0000 mg | ORAL_TABLET | Freq: Every day | ORAL | Status: DC
  Administered 2021-10-12 – 2021-10-13 (×2): 10 mg via ORAL
  Filled 2021-10-11: qty 1
  Filled 2021-10-11: qty 2

## 2021-10-11 MED ORDER — IPRATROPIUM-ALBUTEROL 0.5-2.5 (3) MG/3ML IN SOLN
3.0000 mL | Freq: Two times a day (BID) | RESPIRATORY_TRACT | Status: DC
  Administered 2021-10-11: 3 mL via RESPIRATORY_TRACT
  Filled 2021-10-11: qty 3

## 2021-10-11 MED ORDER — PRAVASTATIN SODIUM 40 MG PO TABS
40.0000 mg | ORAL_TABLET | Freq: Every day | ORAL | Status: DC
  Administered 2021-10-12: 40 mg via ORAL
  Filled 2021-10-11: qty 1

## 2021-10-11 MED ORDER — OXYMETAZOLINE HCL 0.05 % NA SOLN
1.0000 | Freq: Two times a day (BID) | NASAL | Status: DC | PRN
  Filled 2021-10-11: qty 15

## 2021-10-11 NOTE — H&P (Signed)
History and Physical    Adam Wise LKJ:[id number] DOB: 1951/03/09 DOA: 10/11/2021  PCP: [name], NP  Patient coming from: Home  Chief Complaint: "couldn't catch my breath"  HPI: Adam Wise is a 70 y.o. 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 Presenting with dyspnea. Over the past  he has noticed a worsening of his chronic shortness of breath. He has talked with his oncologist about it. He has taken increased tussionex. This morning, he woke up and could not catch his breath. He felt like someone placed a plastic bag over his head. He got his pulse oximeter and it showed his SpO2 was down to 79. He had never seen that low a reading before, so he called for EMS. EMS gave him albuterol, atrovent, solumedrol and Adona en route to the ED. He denies any other aggravating or alleviating factors.   ED Course: CTA chest was negative for clot. It showed increase in size of known pleural effusion. It also showed ground glass opacities and nodular opacities concerning for infection vs inflammatory process. TRH was called for admission.    Review of Systems: Review of systems is otherwise negative for all not mentioned in HPI.   PMHx Past Medical History:  Diagnosis Date   Cancer [facility])    Chickenpox    Chronic knee pain    Chronic low back pain    Essential hypertension    GERD (gastroesophageal reflux disease)    OSA (obstructive sleep apnea)    Testosterone deficiency    Type 2 diabetes mellitus ([place])     PSHx Past Surgical History:  Procedure Laterality Date   COLONOSCOPY WITH PROPOFOL N/A 12/17/2018   Procedure: COLONOSCOPY WITH PROPOFOL;  Surgeon: Virgel Manifold, MD;  Location: [facility] ENDOSCOPY;  Service: Gastroenterology;  Laterality: N/A;   IR IMAGING GUIDED PORT INSERTION  04/17/2020   JOINT REPLACEMENT Bilateral    REPLACEMENT TOTAL KNEE BILATERAL  2015   TONSILLECTOMY  1960    SocHx  reports that he has quit  smoking. He has never used smokeless tobacco. He reports current alcohol use. He reports that he does not currently use drugs.  Allergies  Allergen Reactions   Bupropion     Racing heart   Hydrochlorothiazide Other (See Comments)    Cramping to lower extremities    FamHx Family History  Problem Relation Age of Onset   Cancer Mother    Hypertension Mother    Arthritis Father    Asthma Father    Cancer Father    COPD Father    Heart attack Father    Hypertension Sister    Cancer Sister    Diabetes Sister    Asthma Son    Birth defects Maternal Grandfather    Arthritis Paternal Grandmother    Diabetes Paternal Grandmother    Arthritis Paternal Grandfather    Asthma Sister    Cancer Sister    COPD Sister    Arthritis Sister    Asthma Sister    Diabetes Sister    Other Neg Hx        pituitary disorder    Prior to Admission medications   Medication Sig Start Date End Date Taking? Authorizing Provider  albuterol (VENTOLIN HFA) 108 (90 Base) MCG/ACT inhaler Inhale 2 puffs into the lungs every 6 (six) hours as needed for wheezing or shortness of breath. 03/14/20  Yes Nicolette Bang, MD  amLODipine (NORVASC) 10 MG tablet TAKE  1 TABLET BY MOUTH EVERY DAY for blood pressure. Patient taking differently: Take 10 mg by mouth daily. 07/04/21  Yes [name], NP  Black Pepper-Turmeric (TURMERIC COMPLEX/BLACK PEPPER PO) Take 1 tablet by mouth in the morning and at bedtime.   Yes [provider]  chlorpheniramine-HYDROcodone (TUSSIONEX) 10-8 MG/5ML SUER Take 5 mLs by mouth at bedtime as needed for cough. 07/17/21  Yes Heilingoetter, Cassandra L, PA-C  ELIQUIS 5 MG TABS tablet TAKE 1 TABLET BY MOUTH TWICE A DAY Patient taking differently: Take 5 mg by mouth 2 (two) times daily. 05/26/21  Yes Heilingoetter, Cassandra L, PA-C  esomeprazole (NEXIUM) 20 MG capsule Take 20 mg by mouth daily.    Yes [provider]  gabapentin (NEURONTIN) 300 MG capsule Take 300  mg by mouth at bedtime. 10/03/19  Yes [provider]  glipiZIDE (GLUCOTROL) 10 MG tablet TAKE 1 TABLET (10 MG TOTAL) BY MOUTH 2 (TWO) TIMES DAILY BEFORE A MEAL. FOR DIABETES. Patient taking differently: Take 10 mg by mouth 2 (two) times daily before a meal. 05/26/21  Yes [name], NP  Insulin Glargine (BASAGLAR KWIKPEN) 100 UNIT/ML Inject 20 Units into the skin at bedtime. For diabetes. 10/26/20  Yes [name], NP  lidocaine-prilocaine (EMLA) cream Apply 1 application topically as needed. Patient taking differently: Apply 1 application topically as needed (port access). 10/16/20  Yes Heilingoetter, Cassandra L, PA-C  losartan (COZAAR) 25 MG tablet Take 1 tablet (25 mg total) by mouth daily. For blood pressure. 08/13/21  Yes [name], NP  Melatonin 10 MG CAPS Take 10 mg by mouth at bedtime.   Yes [provider]  metFORMIN (GLUCOPHAGE) 1000 MG tablet TAKE 1 TABLET (1,000 MG TOTAL) BY MOUTH 2 (TWO) TIMES DAILY WITH A MEAL. FOR DIABETES Patient taking differently: Take 1,000 mg by mouth 2 (two) times daily with a meal. 09/10/21  Yes [name], NP  Multiple Vitamin (MULTI-VITAMINS) TABS Take 1 tablet by mouth daily.    Yes [provider]  Omega-3 Fatty Acids (SALMON OIL PO) Take 1 capsule by mouth daily.   Yes [provider]  oxymetazoline (AFRIN) 0.05 % nasal spray Place 1 spray into both nostrils 2 (two) times daily as needed for congestion.   Yes [provider]  pioglitazone (ACTOS) 45 MG tablet TAKE 1 TABLET (45 MG TOTAL) BY MOUTH DAILY. FOR DIABETES. Patient taking differently: Take 45 mg by mouth daily. 10/02/21  Yes [name], NP  pravastatin (PRAVACHOL) 40 MG tablet TAKE 1 TABLET BY MOUTH EVERY DAY IN THE EVENING FOR CHOLESTEROL Patient taking differently: Take 40 mg by mouth daily. 07/04/21  Yes [name], NP  pseudoephedrine-acetaminophen (TYLENOL SINUS) 30-500 MG TABS tablet Take 2 tablets by  mouth at bedtime.   Yes [provider]  testosterone cypionate (DEPOTESTOSTERONE CYPIONATE) 200 MG/ML injection Inject 200 mg into the muscle every 14 (fourteen) days.   Yes [provider]  B-D 3CC LUER-LOK SYR 22GX1" 22G X 1" 3 ML MISC USE AS INSTRUCTED FOR TESTOSTERONE INJECTION EVERY 2 WEEKS 03/20/21   [name], NP  blood glucose meter kit and supplies KIT Dispense based on patient and insurance preference. Use up to four times daily as directed. 04/09/18   [name], NP  Insulin Pen Needle (BD PEN NEEDLE NANO U/F) 32G X 4 MM MISC Use with insulin as prescribed  Dx Code: E11.9 12/11/20   Jearld Fenton, NP  Lancets MISC USE UP  TO 3 TIMES DAILY AS DIRECTED 05/06/18   [name], NP  [facility] ULTRA test strip USE UP TO 4 TIMES DAILY AS DIRECTED 07/10/21   [name], NP  OVER THE COUNTER MEDICATION Take by mouth.    [provider]    Physical Exam: Vitals:   10/11/21 1035 10/11/21 1040 10/11/21 1215 10/11/21 1323  BP:  (!) 154/90 135/78 120/68  Pulse:  (!) 111 94 96  Resp:  (!) 24 (!) 21 20  Temp:  98.5 F (36.9 C)    TempSrc:  Oral    SpO2:  100% 99% 99%  Weight: 132 kg     Height: $Remove'5\' 9"'WTWMESO$  (1.753 m)       General: 70 y.o. male resting in bed in NAD Eyes: PERRL, normal sclera ENMT: Nares patent w/o discharge, orophaynx clear, dentition normal, ears w/o discharge/lesions/ulcers Neck: Supple, trachea midline Cardiovascular: tachy, +S1, S2, no m/g/r, equal pulses throughout Respiratory: decreased at bases, diffuse wheeze, increased WOB 4L GI: BS+, distended but soft, NT, no masses noted, no organomegaly noted MSK: No e/c/c Neuro: A&O x 3, no focal deficits Psyc: Appropriate interaction and affect, calm/cooperative  Labs on Admission: I have personally reviewed following labs and imaging studies  CBC: Recent Labs  Lab 10/08/21 0927 10/11/21 1047  WBC 3.9* 7.0  NEUTROABS 2.6 6.2  HGB 11.9* 11.6*  HCT 36.0* 36.3*   MCV 90.5 94.3  PLT 245 268   Basic Metabolic Panel: Recent Labs  Lab 10/08/21 0927 10/11/21 1047  NA 139 139  K 3.9 4.0  CL 105 103  CO2 25 28  GLUCOSE 183* 197*  BUN 11 26*  CREATININE 1.21 1.30*  CALCIUM 9.0 8.0*   GFR: Estimated Creatinine Clearance: 70.2 mL/min (A) (by C-G formula based on SCr of 1.3 mg/dL (H)). Liver Function Tests: Recent Labs  Lab 10/08/21 0927 10/11/21 1047  AST 21 57*  ALT 19 69*  ALKPHOS 61 45  BILITOT 0.4 0.9  PROT 6.2* 6.6  ALBUMIN 3.6 3.9   No results for input(s): LIPASE, AMYLASE in the last 168 hours. No results for input(s): AMMONIA in the last 168 hours. Coagulation Profile: Recent Labs  Lab 10/11/21 1047  INR 1.0   Cardiac Enzymes: No results for input(s): CKTOTAL, CKMB, CKMBINDEX, TROPONINI in the last 168 hours. BNP (last 3 results) No results for input(s): PROBNP in the last 8760 hours. HbA1C: No results for input(s): HGBA1C in the last 72 hours. CBG: No results for input(s): GLUCAP in the last 168 hours. Lipid Profile: No results for input(s): CHOL, HDL, LDLCALC, TRIG, CHOLHDL, LDLDIRECT in the last 72 hours. Thyroid Function Tests: No results for input(s): TSH, T4TOTAL, FREET4, T3FREE, THYROIDAB in the last 72 hours. Anemia Panel: No results for input(s): VITAMINB12, FOLATE, FERRITIN, TIBC, IRON, RETICCTPCT in the last 72 hours. Urine analysis:    Component Value Date/Time   COLORURINE YELLOW 11/25/2017 [place] 11/25/2017 1359   LABSPEC 1.020 11/25/2017 1359   PHURINE 6.0 11/25/2017 1359   GLUCOSEU 100 (A) 11/25/2017 1359   HGBUR NEGATIVE 11/25/2017 1359   BILIRUBINUR NEGATIVE 11/25/2017 1359   KETONESUR NEGATIVE 11/25/2017 1359   UROBILINOGEN 0.2 11/25/2017 1359   NITRITE NEGATIVE 11/25/2017 1359   LEUKOCYTESUR NEGATIVE 11/25/2017 1359    Radiological Exams on Admission: CT Angio Chest PE W and/or Wo Contrast  Result Date: 10/11/2021 CLINICAL DATA:  History of lung cancer, elevated  D-dimer, shortness of breath, wheezing EXAM: CT ANGIOGRAPHY CHEST WITH CONTRAST TECHNIQUE:  Multidetector CT imaging of the chest was performed using the standard protocol during bolus administration of intravenous contrast. Multiplanar CT image reconstructions and MIPs were obtained to evaluate the vascular anatomy. CONTRAST:  15mL OMNIPAQUE IOHEXOL 350 MG/ML SOLN COMPARISON:  CT chest 10/04/2021 FINDINGS: Cardiovascular: The pulmonary arteries are adequately opacified to the segmental level. There is no evidence of pulmonary embolism. The heart is stable in size. There is no pericardial effusion. There is mild calcified atherosclerotic plaque of the thoracic aorta. The main pulmonary artery is enlarged measuring up to 3.6 cm. Mediastinum/Nodes: The thyroid is unremarkable. The esophagus is grossly unremarkable. There is no mediastinal lymphadenopathy. A 7 mm left hilar lymph node is unchanged. There is no pathologically enlarged mediastinal or hilar lymph node. There is no axillary lymphadenopathy. Lungs/Pleura: The trachea and main airways are patent, though there is moderate narrowing of the right main bronchus, similar to the prior study. There is unchanged masslike consolidative opacity in the medial right upper lobe/suprahilar region abutting the mediastinum. A focus of nodular opacity in the right apex superolateral to the dominant consolidation measuring 1.0 cm is unchanged compared to the most recent study but is increased in conspicuity compared to the remote study from 03/11/2021. There is patchy ground-glass opacity and interlobular septal thickening predominantly in the left lung. There are patchy nodular opacities in the left apex which are new since 10/04/2021. There is partial collapse of the medial right lower lobe which is new since 10/04/2021. The collapsed lung demonstrates postcontrast enhancement. There is a moderate right pleural effusion, increased in size since 10/05/2020. There is no left  pleural effusion. There is no pneumothorax. Upper Abdomen: A left renal cyst is partially imaged. A small right adrenal nodule is unchanged. The imaged portions of the upper abdominal viscera are otherwise unremarkable. Musculoskeletal: There is multilevel degenerative change of the thoracic spine. There is no acute osseous abnormality or aggressive osseous lesion. Review of the MIP images confirms the above findings. IMPRESSION: 1. No evidence of pulmonary embolism. 2. Increased size of a moderate right pleural effusion with new partial collapse of the medial right lower lobe. 3. New ground-glass opacity and interlobular septal thickening predominantly in the left lung suggesting mild to moderate pulmonary interstitial edema. 4. New patchy nodular opacities in the left apex may be infectious or inflammatory in nature. 5. Post treatment change in the right upper lobe is again seen. Focal nodular opacity in the superior right apex is unchanged compared to the most recent study but is increasing in conspicuity compared to more remote studies. 6. Recommend close interval follow-up in 1-3 months to reassess the above findings. Electronically Signed   By: Lesia Hausen M.D.   On: 10/11/2021 12:51   DG Chest Port 1 View  Result Date: 10/11/2021 CLINICAL DATA:  Dyspnea, wheezing EXAM: PORTABLE CHEST 1 VIEW COMPARISON:  Chest radiograph 09/17/2021, CT chest 10/04/2021 FINDINGS: A right chest wall port is in stable position. The cardiomediastinal silhouette is grossly stable. There is unchanged asymmetric elevation of the right hemidiaphragm. Confluent opacity in the medial right lung with associated architectural distortion and volume loss is similar to the prior chest radiograph and CT. The left lung appears clear. A small amount of pleural fluid is again suspected in the right apex, but the right costophrenic angle appears sharp. There is no left pleural effusion. There is no pneumothorax. IMPRESSION: Unchanged right  hilar opacity with associated architectural distortion. No evidence of new/acute pulmonary process. Electronically Signed   By: Theron Arista  Noone M.D.   On: 10/11/2021 11:04    EKG: Independently reviewed. Sinus tach, no st elevations  Assessment/Plan Acute hypoxic respiratory failure COPD exacerbation vs PNA? Sepsis secondary to possible PNA?     - admit to inpt, tele     - continue steroids, duonebs     - add rocephin/zithro     - add anti-tussives     - wean O2 as able     - trend lactic acid     - will also check echo  DM2     - SSI, DM diet, glucose checks, A1c  S4 small cell lung CA on chemo Chronic pleural effusion     - continue follow up with Dr. Julien Nordmann     - he's had an increase in the chronic pleural effusion; however, his time line doesn't fit with this being the cause of his acute change     - can consult IR for thoracentesis  HTN     - continue home regimen  Hx of PE     - continue eliquis  DVT prophylaxis: SCDs  Code Status: FULL  Family Communication: None at bedside  Consults called: None   Status is: Observation  The patient remains OBS appropriate and will d/c before 2 midnights.  Jonnie Finner DO [facility]  If 7PM-7AM, please contact night-coverage [email or web address]  10/11/2021, 1:38 PM

## 2021-10-11 NOTE — ED Triage Notes (Addendum)
Pt BIB [facility] from home. Patient woke up this morning c/o SOB and wheezing. Patient has a hx of lung cancer and PE in the left lung, and type 2 diabetic.Upon arrival patient SPO2 was 79% on RA. EMS gave 10 of albuterol, 500 mcg of Atrovent, 125 mg solumedrol, and was put on 3L nasal cannula sating at 97%. EmS placed 20g in left AC.

## 2021-10-11 NOTE — ED Provider Notes (Signed)
[facility] DEPT Provider Note   CSN: [id number] Arrival date & time: 10/11/21  1022     History Chief Complaint  Patient presents with   Shortness of Breath    Kristy Catoe is a 70 y.o. male.  70 year old male with prior medical history as detailed below presents for evaluation.  Patient with complaint of shortness of breath.  Patient reports acute worsening of his breathing this morning.  Patient felt wheezy.  EMS reported hearing wheezes on their initial evaluation.  Patient's SPO2 was in the 70s on room air.  Patient does not have baseline O2 requirement.  EMS administered 10 mg of albuterol, 500 micrograms of Atrovent, 125 mg of Solu-Medrol, and supplemental O2 and transferred to the ED.  Patient does feel improved now.  He is on 4 L nasal cannula and sating 99%.  He reports that his breathing is improved from earlier this morning.  He denies associated chest pain.  He denies recent fever.  He reports prior history of lung cancer and PE.  He is currently on Eliquis with full compliance reported.  The history is provided by the patient.  Shortness of Breath Severity:  Moderate Onset quality:  Sudden Duration:  6 hours Timing:  Rare Progression:  Waxing and waning Chronicity:  New Relieved by:  Nothing Worsened by:  Nothing     Past Medical History:  Diagnosis Date   Cancer ([place])    Chickenpox    Chronic knee pain    Chronic low back pain    Essential hypertension    GERD (gastroesophageal reflux disease)    OSA (obstructive sleep apnea)    Testosterone deficiency    Type 2 diabetes mellitus ([place])     Patient Active Problem List   Diagnosis Date Noted   Recurrent right pleural effusion 08/08/2021   Erectile dysfunction 10/26/2020   Port-A-Cath in place 06/11/2020   History of pulmonary embolism 05/15/2020   Small cell lung cancer, right upper lobe ([place]) 04/12/2020   Chronic fatigue 04/12/2020   Encounter for antineoplastic  chemotherapy 04/12/2020   Encounter for antineoplastic immunotherapy 04/12/2020   Malignant neoplasm of upper lobe of right lung ([place]) 04/04/2020   Goals of care, counseling/discussion 03/30/2020   Shortness of breath 03/26/2020   Hyperlipidemia 11/08/2019   Joint swelling 11/16/2018   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   Essential hypertension 09/09/2017   Type 2 diabetes mellitus ([place]) 09/09/2017   GERD (gastroesophageal reflux disease) 09/09/2017   Testosterone deficiency 09/09/2017   OSA (obstructive sleep apnea) 09/09/2017   Chronic bilateral low back pain without sciatica 09/09/2017   H/O knee surgery 04/27/2014   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    Past Surgical History:  Procedure Laterality Date   COLONOSCOPY WITH PROPOFOL N/A 12/17/2018   Procedure: COLONOSCOPY WITH PROPOFOL;  Surgeon: Virgel Manifold, MD;  Location: [facility] ENDOSCOPY;  Service: Gastroenterology;  Laterality: N/A;   IR IMAGING GUIDED PORT INSERTION  04/17/2020   JOINT REPLACEMENT Bilateral    REPLACEMENT TOTAL KNEE BILATERAL  2015   TONSILLECTOMY  1960       Family History  Problem Relation Age of Onset   Cancer Mother    Hypertension Mother    Arthritis Father    Asthma Father    Cancer Father    COPD Father    Heart attack Father    Hypertension Sister    Cancer Sister    Diabetes Sister    Asthma Son    Birth defects Maternal Grandfather  Arthritis Paternal Grandmother    Diabetes Paternal Grandmother    Arthritis Paternal Grandfather    Asthma Sister    Cancer Sister    COPD Sister    Arthritis Sister    Asthma Sister    Diabetes Sister    Other Neg Hx        pituitary disorder    Social History   Tobacco Use   Smoking status: Former   Smokeless tobacco: Never  Scientific laboratory technician Use: Never used  Substance Use Topics   Alcohol use: Yes    Comment: rarely   Drug use: Not Currently    Home Medications Prior to Admission medications   Medication Sig Start  Date End Date Taking? Authorizing Provider  albuterol (VENTOLIN HFA) 108 (90 Base) MCG/ACT inhaler Inhale 2 puffs into the lungs every 6 (six) hours as needed for wheezing or shortness of breath. 03/14/20   Nicolette Bang, MD  amLODipine (NORVASC) 10 MG tablet TAKE 1 TABLET BY MOUTH EVERY DAY for blood pressure. 07/04/21   [name], NP  B-D 3CC LUER-LOK SYR 22GX1" 22G X 1" 3 ML MISC USE AS INSTRUCTED FOR TESTOSTERONE INJECTION EVERY 2 WEEKS 03/20/21   [name], NP  Black Pepper-Turmeric (TURMERIC COMPLEX/BLACK PEPPER PO) Take 1 tablet by mouth in the morning and at bedtime.    [provider]  blood glucose meter kit and supplies KIT Dispense based on patient and insurance preference. Use up to four times daily as directed. 04/09/18   [name], NP  chlorpheniramine-HYDROcodone (TUSSIONEX) 10-8 MG/5ML SUER Take 5 mLs by mouth at bedtime as needed for cough. 07/17/21   Heilingoetter, Cassandra L, PA-C  Coenzyme Q10 ([employer] COQ-10) 200 MG capsule Take 200 mg by mouth every evening.     [provider]  ELIQUIS 5 MG TABS tablet TAKE 1 TABLET BY MOUTH TWICE A DAY 05/26/21   Heilingoetter, Cassandra L, PA-C  esomeprazole (NEXIUM) 20 MG capsule Take 20 mg by mouth daily.     [provider]  fluticasone (FLONASE) 50 MCG/ACT nasal spray Place 2 sprays into both nostrils daily as needed for allergies. 03/16/17   [provider]  gabapentin (NEURONTIN) 300 MG capsule Take 300 mg by mouth 2 (two) times daily.  10/03/19   [provider]  glipiZIDE (GLUCOTROL) 10 MG tablet TAKE 1 TABLET (10 MG TOTAL) BY MOUTH 2 (TWO) TIMES DAILY BEFORE A MEAL. FOR DIABETES. 05/26/21   [name], NP  Insulin Glargine [facility]) 100 UNIT/ML Inject 20 Units into the skin at bedtime. For diabetes. Patient not taking: Reported on 08/13/2021 10/26/20   [name], NP  Insulin Pen Needle (BD PEN NEEDLE NANO U/F) 32G X 4 MM MISC Use with  insulin as prescribed  Dx Code: E11.9 12/11/20   Jearld Fenton, NP  KRILL OIL PO Take 350 mg by mouth daily.    [provider]  Lancets MISC USE UP TO 3 TIMES DAILY AS DIRECTED 05/06/18   [name], NP  lidocaine-prilocaine (EMLA) cream Apply 1 application topically as needed. 10/16/20   Heilingoetter, Cassandra L, PA-C  losartan (COZAAR) 25 MG tablet Take 1 tablet (25 mg total) by mouth daily. For blood pressure. 08/13/21   [name], NP  Melatonin 10 MG CAPS Take 10 mg by mouth at bedtime.    [provider]  metFORMIN (GLUCOPHAGE) 1000 MG tablet TAKE 1 TABLET (1,000 MG TOTAL) BY MOUTH 2 (  TWO) TIMES DAILY WITH A MEAL. FOR DIABETES 09/10/21   [name], NP  Multiple Vitamin (MULTI-VITAMINS) TABS Take 1 tablet by mouth daily.     [provider]  [facility] ULTRA test strip USE UP TO 4 TIMES DAILY AS DIRECTED 07/10/21   [name], NP  OVER THE COUNTER MEDICATION Take by mouth.    [provider]  oxymetazoline (AFRIN) 0.05 % nasal spray Place 1 spray into both nostrils 2 (two) times daily as needed for congestion.    [provider]  pioglitazone (ACTOS) 45 MG tablet TAKE 1 TABLET (45 MG TOTAL) BY MOUTH DAILY. FOR DIABETES. 10/02/21   [name], NP  pravastatin (PRAVACHOL) 40 MG tablet TAKE 1 TABLET BY MOUTH EVERY DAY IN THE EVENING FOR CHOLESTEROL 07/04/21   [name], NP  prochlorperazine (COMPAZINE) 10 MG tablet Take 1 tablet (10 mg total) by mouth every 6 (six) hours as needed for nausea or vomiting. 04/19/20   Curt Bears, MD  sildenafil (REVATIO) 20 MG tablet Take 3-5 tablets by mouth 1 hour prior to intercourse as needed. 10/26/20   [name], NP  testosterone cypionate (DEPOTESTOSTERONE CYPIONATE) 200 MG/ML injection Inject 200 mg into the muscle every 14 (fourteen) days.    [provider]    Allergies    Bupropion and Hydrochlorothiazide  Review of Systems   Review of  Systems  Respiratory:  Positive for shortness of breath.   All other systems reviewed and are negative.  Physical Exam Updated Vital Signs BP (!) 154/90 (BP Location: Right Arm)   Pulse (!) 111   Temp 98.5 F (36.9 C) (Oral)   Resp (!) 24   Ht _0  (1.753 m)   Wt 132 kg   SpO2 100%   BMI 42.97 kg/m   Physical Exam Vitals and nursing note reviewed.  Constitutional:      General: He is not in acute distress.    Appearance: Normal appearance. He is well-developed.  HENT:     Head: Normocephalic and atraumatic.  Eyes:     Conjunctiva/sclera: Conjunctivae normal.     Pupils: Pupils are equal, round, and reactive to light.  Cardiovascular:     Rate and Rhythm: Normal rate and regular rhythm.     Heart sounds: Normal heart sounds.  Pulmonary:     Effort: Pulmonary effort is normal. No respiratory distress.     Breath sounds: Normal breath sounds.  Abdominal:     General: There is no distension.     Palpations: Abdomen is soft.     Tenderness: There is no abdominal tenderness.  Musculoskeletal:        General: No deformity. Normal range of motion.     Cervical back: Normal range of motion and neck supple.     Right lower leg: Edema present.     Left lower leg: Edema present.     Comments: 2 plus edema noted to bilateral pre-tibial area  Skin:    General: Skin is warm and dry.  Neurological:     General: No focal deficit present.     Mental Status: He is alert and oriented to person, place, and time.    ED Results / Procedures / Treatments   Labs (all labs ordered are listed, but only abnormal results are displayed) Labs Reviewed  CULTURE, BLOOD (ROUTINE X 2)  CULTURE, BLOOD (ROUTINE X 2)  RESP PANEL BY RT-PCR (FLU A&B, COVID) ARPGX2  BRAIN NATRIURETIC PEPTIDE  COMPREHENSIVE  METABOLIC PANEL  CBC WITH DIFFERENTIAL/PLATELET  PROTIME-INR  D-DIMER, QUANTITATIVE  URINALYSIS, ROUTINE W REFLEX MICROSCOPIC  LACTIC ACID, PLASMA  LACTIC ACID, PLASMA  TROPONIN I (HIGH  SENSITIVITY)    EKG None  Radiology No results found.  Procedures Procedures   Medications Ordered in ED Medications - No data to display  ED Course  I have reviewed the triage vital signs and the nursing notes.  Pertinent labs & imaging results that were available during my care of the patient were reviewed by me and considered in my medical decision making (see chart for details).    MDM Rules/Calculators/A&P                           MDM  MSE complete  Corderro Koloski was evaluated in Emergency Department on 10/11/2021 for the symptoms described in the history of present illness. He was evaluated in the context of the global COVID-19 pandemic, which necessitated consideration that the patient might be at risk for infection with the SARS-CoV-2 virus that causes COVID-19. Institutional protocols and algorithms that pertain to the evaluation of patients at risk for COVID-19 are in a state of rapid change based on information released by regulatory bodies including the CDC and federal and state organizations. These policies and algorithms were followed during the patient's care in the ED.  Patient is presenting with acute dyspnea.   Patient is significantly improved after treatment with EMS.  Work-up does not demonstrate evidence of PE.  Patient with known prior history of lung malignancy with recurrent right pleural effusion.  Doubt symptoms from today are entirely result of pleural effusion.  Patient would benefit from admission.  Hospitalist service is aware of case and will evaluate for same.    Final Clinical Impression(s) / ED Diagnoses Final diagnoses:  Dyspnea, unspecified type    Rx / DC Orders ED Discharge Orders     None        Valarie Merino, MD 10/11/21 1328

## 2021-10-11 NOTE — ED Notes (Signed)
Critical Value. Lactic 2.1  Notified MD at this time. 1218  MD notified: Dene Gentry.

## 2021-10-11 NOTE — Progress Notes (Addendum)
Notified Dr. Marylyn Ishihara that the thoracentesis ordered for pt will be done tomorrow since [place] is unable to accommodate for procedure today.   Narda Rutherford, AGNP-BC 10/11/2021, 5:13 PM

## 2021-10-12 ENCOUNTER — Observation Stay (HOSPITAL_COMMUNITY): Payer: Medicare Other

## 2021-10-12 DIAGNOSIS — J90 Pleural effusion, not elsewhere classified: Secondary | ICD-10-CM | POA: Diagnosis not present

## 2021-10-12 DIAGNOSIS — Z6841 Body Mass Index (BMI) 40.0 and over, adult: Secondary | ICD-10-CM | POA: Diagnosis not present

## 2021-10-12 DIAGNOSIS — C3491 Malignant neoplasm of unspecified part of right bronchus or lung: Secondary | ICD-10-CM | POA: Diagnosis not present

## 2021-10-12 DIAGNOSIS — I700 Atherosclerosis of aorta: Secondary | ICD-10-CM | POA: Diagnosis present

## 2021-10-12 DIAGNOSIS — J440 Chronic obstructive pulmonary disease with acute lower respiratory infection: Secondary | ICD-10-CM | POA: Diagnosis present

## 2021-10-12 DIAGNOSIS — I509 Heart failure, unspecified: Secondary | ICD-10-CM | POA: Diagnosis not present

## 2021-10-12 DIAGNOSIS — Z20822 Contact with and (suspected) exposure to covid-19: Secondary | ICD-10-CM | POA: Diagnosis present

## 2021-10-12 DIAGNOSIS — I2510 Atherosclerotic heart disease of native coronary artery without angina pectoris: Secondary | ICD-10-CM | POA: Diagnosis present

## 2021-10-12 DIAGNOSIS — G4733 Obstructive sleep apnea (adult) (pediatric): Secondary | ICD-10-CM | POA: Diagnosis not present

## 2021-10-12 DIAGNOSIS — J441 Chronic obstructive pulmonary disease with (acute) exacerbation: Secondary | ICD-10-CM | POA: Diagnosis not present

## 2021-10-12 DIAGNOSIS — J9621 Acute and chronic respiratory failure with hypoxia: Secondary | ICD-10-CM | POA: Diagnosis not present

## 2021-10-12 DIAGNOSIS — E785 Hyperlipidemia, unspecified: Secondary | ICD-10-CM | POA: Diagnosis present

## 2021-10-12 DIAGNOSIS — Z7984 Long term (current) use of oral hypoglycemic drugs: Secondary | ICD-10-CM | POA: Diagnosis not present

## 2021-10-12 DIAGNOSIS — Z79899 Other long term (current) drug therapy: Secondary | ICD-10-CM | POA: Diagnosis not present

## 2021-10-12 DIAGNOSIS — K219 Gastro-esophageal reflux disease without esophagitis: Secondary | ICD-10-CM | POA: Diagnosis present

## 2021-10-12 DIAGNOSIS — J189 Pneumonia, unspecified organism: Secondary | ICD-10-CM | POA: Diagnosis present

## 2021-10-12 DIAGNOSIS — Z8261 Family history of arthritis: Secondary | ICD-10-CM | POA: Diagnosis not present

## 2021-10-12 DIAGNOSIS — Z7901 Long term (current) use of anticoagulants: Secondary | ICD-10-CM | POA: Diagnosis not present

## 2021-10-12 DIAGNOSIS — J449 Chronic obstructive pulmonary disease, unspecified: Secondary | ICD-10-CM | POA: Insufficient documentation

## 2021-10-12 DIAGNOSIS — G8929 Other chronic pain: Secondary | ICD-10-CM | POA: Diagnosis present

## 2021-10-12 DIAGNOSIS — C787 Secondary malignant neoplasm of liver and intrahepatic bile duct: Secondary | ICD-10-CM | POA: Diagnosis present

## 2021-10-12 DIAGNOSIS — C3411 Malignant neoplasm of upper lobe, right bronchus or lung: Secondary | ICD-10-CM | POA: Diagnosis present

## 2021-10-12 DIAGNOSIS — Z8249 Family history of ischemic heart disease and other diseases of the circulatory system: Secondary | ICD-10-CM | POA: Diagnosis not present

## 2021-10-12 DIAGNOSIS — E119 Type 2 diabetes mellitus without complications: Secondary | ICD-10-CM | POA: Diagnosis present

## 2021-10-12 DIAGNOSIS — Z825 Family history of asthma and other chronic lower respiratory diseases: Secondary | ICD-10-CM | POA: Diagnosis not present

## 2021-10-12 DIAGNOSIS — I10 Essential (primary) hypertension: Secondary | ICD-10-CM | POA: Diagnosis present

## 2021-10-12 HISTORY — DX: Acute and chronic respiratory failure with hypoxia: J96.21

## 2021-10-12 HISTORY — DX: Chronic obstructive pulmonary disease with (acute) exacerbation: J44.1

## 2021-10-12 LAB — URINALYSIS, ROUTINE W REFLEX MICROSCOPIC
Bacteria, UA: NONE SEEN
Bilirubin Urine: NEGATIVE
Glucose, UA: >=500 mg/dL — AB
Hgb urine dipstick: NEGATIVE
Ketones, ur: NEGATIVE mg/dL
Leukocytes,Ua: NEGATIVE
Nitrite: NEGATIVE
Protein, ur: NEGATIVE mg/dL
Specific Gravity, Urine: 1.032 — ABNORMAL HIGH (ref 1.005–1.030)
pH: 5 (ref 5.0–8.0)

## 2021-10-12 LAB — CBC
HCT: 31.7 % — ABNORMAL LOW (ref 39.0–52.0)
Hemoglobin: 10.2 g/dL — ABNORMAL LOW (ref 13.0–17.0)
MCH: 30.3 pg (ref 26.0–34.0)
MCHC: 32.2 g/dL (ref 30.0–36.0)
MCV: 94.1 fL (ref 80.0–100.0)
Platelets: 166 10*3/uL (ref 150–400)
RBC: 3.37 MIL/uL — ABNORMAL LOW (ref 4.22–5.81)
RDW: 15.4 % (ref 11.5–15.5)
WBC: 5.4 10*3/uL (ref 4.0–10.5)
nRBC: 0 % (ref 0.0–0.2)

## 2021-10-12 LAB — COMPREHENSIVE METABOLIC PANEL
ALT: 50 U/L — ABNORMAL HIGH (ref 0–44)
AST: 31 U/L (ref 15–41)
Albumin: 3.6 g/dL (ref 3.5–5.0)
Alkaline Phosphatase: 37 U/L — ABNORMAL LOW (ref 38–126)
Anion gap: 6 (ref 5–15)
BUN: 34 mg/dL — ABNORMAL HIGH (ref 8–23)
CO2: 25 mmol/L (ref 22–32)
Calcium: 8.1 mg/dL — ABNORMAL LOW (ref 8.9–10.3)
Chloride: 103 mmol/L (ref 98–111)
Creatinine, Ser: 1.26 mg/dL — ABNORMAL HIGH (ref 0.61–1.24)
GFR, Estimated: >60 mL/min (ref >60)
Glucose, Bld: 222 mg/dL — ABNORMAL HIGH (ref 70–99)
Potassium: 5 mmol/L (ref 3.5–5.1)
Sodium: 134 mmol/L — ABNORMAL LOW (ref 135–145)
Total Bilirubin: 0.6 mg/dL (ref 0.3–1.2)
Total Protein: 5.9 g/dL — ABNORMAL LOW (ref 6.5–8.1)

## 2021-10-12 LAB — BLOOD CULTURE ID PANEL (REFLEXED) - BCID2

## 2021-10-12 LAB — GRAM STAIN

## 2021-10-12 LAB — GLUCOSE, CAPILLARY
Glucose-Capillary: 282 mg/dL — ABNORMAL HIGH (ref 70–99)
Glucose-Capillary: 200 mg/dL — ABNORMAL HIGH (ref 70–99)

## 2021-10-12 LAB — ECHOCARDIOGRAM COMPLETE
AR max vel: 2.66 cm2
AV Peak grad: 8.4 mmHg
Ao pk vel: 1.45 m/s
Area-P 1/2: 5.16 cm2
Calc EF: 51.8 %
Height: 69 in
S' Lateral: 3.5 cm
Single Plane A2C EF: 51.3 %
Single Plane A4C EF: 52.8 %
Weight: 4656.12 oz

## 2021-10-12 LAB — CBG MONITORING, ED
Glucose-Capillary: 212 mg/dL — ABNORMAL HIGH (ref 70–99)
Glucose-Capillary: 246 mg/dL — ABNORMAL HIGH (ref 70–99)

## 2021-10-12 LAB — MRSA NEXT GEN BY PCR, NASAL: MRSA by PCR Next Gen: NOT DETECTED

## 2021-10-12 LAB — STREP PNEUMONIAE URINARY ANTIGEN: Strep Pneumo Urinary Antigen: NEGATIVE

## 2021-10-12 LAB — PROTEIN, PLEURAL OR PERITONEAL FLUID: Total protein, fluid: <3 g/dL

## 2021-10-12 MED ORDER — CHLORHEXIDINE GLUCONATE CLOTH 2 % EX PADS
6.0000 | MEDICATED_PAD | Freq: Every day | CUTANEOUS | Status: DC
  Administered 2021-10-13: 6 via TOPICAL

## 2021-10-12 MED ORDER — LIDOCAINE HCL 1 % IJ SOLN
INTRAMUSCULAR | Status: AC
  Filled 2021-10-12: qty 20

## 2021-10-12 MED ORDER — FUROSEMIDE 10 MG/ML IJ SOLN
40.0000 mg | Freq: Once | INTRAMUSCULAR | Status: DC
  Filled 2021-10-12: qty 4

## 2021-10-12 NOTE — ED Notes (Signed)
Pt provided breakfast tray.

## 2021-10-12 NOTE — Progress Notes (Signed)
PHARMACY - PHYSICIAN COMMUNICATION CRITICAL VALUE ALERT - BLOOD CULTURE IDENTIFICATION (BCID)  Adam Wise is an 70 y.o. male who presented to [facility] on 10/11/2021 with a chief complaint of difficulty breathing  Assessment:  Bcx with gram positive cocci in 1/4 bottles- BCID with staphylococcus species  Name of physician (or Provider) Contacted: Dr. Lonny Prude  Current antibiotics: azithromycin and ceftriaxone for pneumonia  Changes to prescribed antibiotics recommended:  Patient is on recommended antibiotics - No changes needed. Organism in blood culture is likely a contaminant  Results for orders placed or performed during the hospital encounter of 10/11/21  Blood Culture ID Panel (Reflexed) (Collected: 10/11/2021 10:48 AM)  Result Value Ref Range   Enterococcus faecalis NOT DETECTED NOT DETECTED   Enterococcus Faecium NOT DETECTED NOT DETECTED   Listeria monocytogenes NOT DETECTED NOT DETECTED   Staphylococcus species DETECTED (A) NOT DETECTED   Staphylococcus aureus (BCID) NOT DETECTED NOT DETECTED   Staphylococcus epidermidis NOT DETECTED NOT DETECTED   Staphylococcus lugdunensis NOT DETECTED NOT DETECTED   Streptococcus species NOT DETECTED NOT DETECTED   Streptococcus agalactiae NOT DETECTED NOT DETECTED   Streptococcus pneumoniae NOT DETECTED NOT DETECTED   Streptococcus pyogenes NOT DETECTED NOT DETECTED   A.calcoaceticus-baumannii NOT DETECTED NOT DETECTED   Bacteroides fragilis NOT DETECTED NOT DETECTED   Enterobacterales NOT DETECTED NOT DETECTED   Enterobacter cloacae complex NOT DETECTED NOT DETECTED   Escherichia coli NOT DETECTED NOT DETECTED   Klebsiella aerogenes NOT DETECTED NOT DETECTED   Klebsiella oxytoca NOT DETECTED NOT DETECTED   Klebsiella pneumoniae NOT DETECTED NOT DETECTED   Proteus species NOT DETECTED NOT DETECTED   Salmonella species NOT DETECTED NOT DETECTED   Serratia marcescens NOT DETECTED NOT DETECTED   Haemophilus influenzae NOT DETECTED  NOT DETECTED   Neisseria meningitidis NOT DETECTED NOT DETECTED   Pseudomonas aeruginosa NOT DETECTED NOT DETECTED   Stenotrophomonas maltophilia NOT DETECTED NOT DETECTED   Candida albicans NOT DETECTED NOT DETECTED   Candida auris NOT DETECTED NOT DETECTED   Candida glabrata NOT DETECTED NOT DETECTED   Candida krusei NOT DETECTED NOT DETECTED   Candida parapsilosis NOT DETECTED NOT DETECTED   Candida tropicalis NOT DETECTED NOT DETECTED   Cryptococcus neoformans/gattii NOT DETECTED NOT DETECTED   Dimple Nanas, PharmD 10/12/2021 9:39 AM

## 2021-10-12 NOTE — Progress Notes (Signed)
SATURATION QUALIFICATIONS: (This note is used to comply with regulatory documentation for home oxygen)  Patient Saturations on Room Air at Rest = 94%  Patient Saturations on Room Air while Ambulating = 91%  Patient Saturations on N/A Liters of oxygen while Ambulating = N/A%  Please briefly explain why patient needs home oxygen: N/A

## 2021-10-12 NOTE — Progress Notes (Signed)
Dr. Lonny Prude paged and made aware of pleural cytology gram stain results: WBC's present both PMN and mononuclear with no organisms present.

## 2021-10-12 NOTE — Procedures (Signed)
PROCEDURE SUMMARY:  Successful US guided diagnostic and therapeutic right thoracentesis. Yielded 1.2 L of hazy, amber fluid. Pt tolerated procedure well. No immediate complications.  Specimen was sent for labs. CXR ordered.  EBL <1 mL  Tyson Alias, AGNP 10/12/2021 12:18 PM

## 2021-10-12 NOTE — Progress Notes (Addendum)
PROGRESS NOTE    Adam Wise  NKN:[id number] DOB: [birth year] DOA: 10/11/2021 PCP: [name], NP   Brief Narrative: Adam Wise is a 70 y.o. 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 Patient presented secondary to dyspnea with concern for COPD vs pneumonia on admission. Patient started on prednisone/duonebs and antibiotics.   Assessment & Plan:   Principal Problem:   Acute on chronic respiratory failure with hypoxia (HCC) Active Problems:   Type 2 diabetes mellitus (HCC)   OSA (obstructive sleep apnea)   COPD exacerbation (HCC)   Acute on chronic respiratory failure Patient states he is not currently on oxygen but was recently requiring 2 L/min as an outpatient. Patient requiring up to 3 L/min during this hospitalization. CTA chest significant for ground-glass opacity in addition to interstitial edema and right pleural effusion -Wean to room air if able, but may only wean down to previous baseline of 2 L/min -Ambulate with pulse oximetry  Possible COPD exacerbation No wheezing on exam this morning. Started on Prednisone and Dunebs -Continue Duonebs and prednisone for now  Possible Pneumonia Patient with new ground-glass opacity on CTA chest. No associated fever or leukocytosis. Started empirically on Ceftriaxone and azithromycin -Continue Ceftriaxone and azithromycin  Sepsis Presumed source of pneumonia. Present on admission. Blood cultures positive (1/4) for staph species -Follow-up blood cultures  Positive blood culture BICD positive for staphylococcus species. Unsure if this is a real infection vs contaminant. No evidence of methicillin resistance on BCID -Follow-up culture data as mentioned above  Right pleural effusion Recurrent. Possibly malignant effusion but do not see cytology in history. Possibly contributing to symptoms. -Thoracentesis  Primary hypertension -Continue amlodipine  Diabetes mellitus, type  2 -Continue SSI  History of PE -Continue Eliquis  Metastatic small cell lung cancer Patient on chemotherapy. Patient follows with oncology as an outpatient.  OSA -Continue CPAP qhs  Morbid obesity Body mass index is 42.97 kg/m.   DVT prophylaxis: Eliquis Code Status:   Code Status: Full Code Family Communication: None at bedside Disposition Plan: Discharge home likely in 2 days once transitioned to oral antibiotics, improvement of dyspnea   Consultants:  None  Procedures:  None  Antimicrobials: Ceftriaxone Azithromycin    Subjective: Patient feels better than on admission. Not at baseline, however. Still dyspneic with ambulation.  Objective: Vitals:   10/12/21 1500 10/12/21 1524 10/12/21 1536 10/12/21 1536  BP: 123/71  133/90   Pulse: (!) 105 (!) 106 (!) 107   Resp: 17 18 17    Temp:    98.3 F (36.8 C)  TempSrc:    Oral  SpO2: 91% 94% 97%   Weight:      Height:        Intake/Output Summary (Last 24 hours) at 10/12/2021 1630 Last data filed at 10/12/2021 0905 Gross per 24 hour  Intake --  Output 250 ml  Net -250 ml   Filed Weights   10/11/21 1035  Weight: 132 kg    Examination:  General exam: Appears calm and comfortable Respiratory system: Clear to auscultation. Respiratory effort normal. Cardiovascular system: S1 & S2 heard, RRR. No murmurs, rubs, gallops or clicks. Gastrointestinal system: Abdomen is protuberant, soft and nontender. No organomegaly or masses felt. Normal bowel sounds heard. Central nervous system: Alert and oriented. No focal neurological deficits. Musculoskeletal: No calf tenderness Skin: No cyanosis. No rashes Psychiatry: Judgement and insight appear normal. Mood & affect appropriate.     Data Reviewed: I have personally  reviewed following labs and imaging studies  CBC Lab Results  Component Value Date   WBC 5.4 10/12/2021   RBC 3.37 (L) 10/12/2021   HGB 10.2 (L) 10/12/2021   HCT 31.7 (L) 10/12/2021   MCV 94.1  10/12/2021   MCH 30.3 10/12/2021   PLT 166 10/12/2021   MCHC 32.2 10/12/2021   RDW 15.4 10/12/2021   LYMPHSABS 0.5 (L) 10/11/2021   MONOABS 0.2 10/11/2021   EOSABS 0.0 10/11/2021   BASOSABS 0.0 40/81/4481     Last metabolic panel Lab Results  Component Value Date   NA 134 (L) 10/12/2021   K 5.0 10/12/2021   CL 103 10/12/2021   CO2 25 10/12/2021   BUN 34 (H) 10/12/2021   CREATININE 1.26 (H) 10/12/2021   GLUCOSE 222 (H) 10/12/2021   GFRNONAA >60 10/12/2021   GFRAA >60 09/11/2020   CALCIUM 8.1 (L) 10/12/2021   PROT 5.9 (L) 10/12/2021   ALBUMIN 3.6 10/12/2021   BILITOT 0.6 10/12/2021   ALKPHOS 37 (L) 10/12/2021   AST 31 10/12/2021   ALT 50 (H) 10/12/2021   ANIONGAP 6 10/12/2021    CBG (last 3)  Recent Labs    10/11/21 2333 10/12/21 0744 10/12/21 1233  GLUCAP 251* 212* 246*     GFR: Estimated Creatinine Clearance: 72.4 mL/min (A) (by C-G formula based on SCr of 1.26 mg/dL (H)).  Coagulation Profile: Recent Labs  Lab 10/11/21 1047  INR 1.0    Recent Results (from the past 240 hour(s))  Culture, blood (routine x 2)     Status: None (Preliminary result)   Collection Time: 10/11/21 10:48 AM   Specimen: BLOOD  Result Value Ref Range Status   Specimen Description   Final    BLOOD LEFT ANTECUBITAL Performed at [facility] [address], [place], [place] [address]    Special Requests   Final    BOTTLES DRAWN AEROBIC AND ANAEROBIC Blood Culture adequate volume Performed at [facility] [address] [place], [place] [address]    Culture  Setup Time   Final    GRAM POSITIVE COCCI IN BOTH AEROBIC AND ANAEROBIC BOTTLES CRITICAL RESULT CALLED TO, READ BACK BY AND VERIFIED WITH: PHARMD M.BELL AT 6378 ON 10/12/2021 BY T.SAAD. Performed at [facility], [place] [address], [place],  [address]    Culture GRAM POSITIVE COCCI  Final   Report Status PENDING  Incomplete  Blood Culture ID Panel (Reflexed)     Status:  Abnormal   Collection Time: 10/11/21 10:48 AM  Result Value Ref Range Status   Enterococcus faecalis NOT DETECTED NOT DETECTED Final   Enterococcus Faecium NOT DETECTED NOT DETECTED Final   Listeria monocytogenes NOT DETECTED NOT DETECTED Final   Staphylococcus species DETECTED (A) NOT DETECTED Final    Comment: CRITICAL RESULT CALLED TO, READ BACK BY AND VERIFIED WITH: PHARMD M.BELL AT 2774 ON 10/12/2021 BY T.SAAD.    Staphylococcus aureus (BCID) NOT DETECTED NOT DETECTED Final   Staphylococcus epidermidis NOT DETECTED NOT DETECTED Final   Staphylococcus lugdunensis NOT DETECTED NOT DETECTED Final   Streptococcus species NOT DETECTED NOT DETECTED Final   Streptococcus agalactiae NOT DETECTED NOT DETECTED Final   Streptococcus pneumoniae NOT DETECTED NOT DETECTED Final   Streptococcus pyogenes NOT DETECTED NOT DETECTED Final   A.calcoaceticus-baumannii NOT DETECTED NOT DETECTED Final   Bacteroides fragilis NOT DETECTED NOT DETECTED Final   Enterobacterales NOT DETECTED NOT DETECTED Final   Enterobacter cloacae complex NOT DETECTED NOT DETECTED Final  Escherichia coli NOT DETECTED NOT DETECTED Final   Klebsiella aerogenes NOT DETECTED NOT DETECTED Final   Klebsiella oxytoca NOT DETECTED NOT DETECTED Final   Klebsiella pneumoniae NOT DETECTED NOT DETECTED Final   Proteus species NOT DETECTED NOT DETECTED Final   Salmonella species NOT DETECTED NOT DETECTED Final   Serratia marcescens NOT DETECTED NOT DETECTED Final   Haemophilus influenzae NOT DETECTED NOT DETECTED Final   Neisseria meningitidis NOT DETECTED NOT DETECTED Final   Pseudomonas aeruginosa NOT DETECTED NOT DETECTED Final   Stenotrophomonas maltophilia NOT DETECTED NOT DETECTED Final   Candida albicans NOT DETECTED NOT DETECTED Final   Candida auris NOT DETECTED NOT DETECTED Final   Candida glabrata NOT DETECTED NOT DETECTED Final   Candida krusei NOT DETECTED NOT DETECTED Final   Candida parapsilosis NOT DETECTED NOT  DETECTED Final   Candida tropicalis NOT DETECTED NOT DETECTED Final   Cryptococcus neoformans/gattii NOT DETECTED NOT DETECTED Final    Comment: Performed at [facility], [place] [address] [place], [place] [address]  Resp Panel by RT-PCR (Flu A&B, Covid) Nasopharyngeal Swab     Status: None   Collection Time: 10/11/21 10:50 AM   Specimen: Nasopharyngeal Swab; Nasopharyngeal(NP) swabs in vial transport medium  Result Value Ref Range Status   SARS Coronavirus 2 by RT PCR NEGATIVE NEGATIVE Final    Comment: (NOTE) SARS-CoV-2 target nucleic acids are NOT DETECTED.  The SARS-CoV-2 RNA is generally detectable in upper respiratory specimens during the acute phase of infection. The lowest concentration of SARS-CoV-2 viral copies this assay can detect is 138 copies/mL. A negative result does not preclude SARS-Cov-2 infection and should not be used as the sole basis for treatment or other patient management decisions. A negative result may occur with  improper specimen collection/handling, submission of specimen other than nasopharyngeal swab, presence of viral mutation(s) within the areas targeted by this assay, and inadequate number of viral copies(<138 copies/mL). A negative result must be combined with clinical observations, patient history, and epidemiological information. The expected result is Negative.  Fact Sheet for Patients:  EntrepreneurPulse.com.au  Fact Sheet for Healthcare Providers:  IncredibleEmployment.be  This test is no t yet approved or cleared by the [place] FDA and  has been authorized for detection and/or diagnosis of SARS-CoV-2 by FDA under an Emergency Use Authorization (EUA). This EUA will remain  in effect (meaning this test can be used) for the duration of the COVID-19 declaration under Section 564(b)(1) of the Act, 21 U.S.C.section 360bbb-3(b)(1), unless the authorization is terminated  or revoked sooner.        Influenza A by PCR NEGATIVE NEGATIVE Final   Influenza B by PCR NEGATIVE NEGATIVE Final    Comment: (NOTE) The Xpert Xpress SARS-CoV-2/FLU/RSV plus assay is intended as an aid in the diagnosis of influenza from Nasopharyngeal swab specimens and should not be used as a sole basis for treatment. Nasal washings and aspirates are unacceptable for Xpert Xpress SARS-CoV-2/FLU/RSV testing.  Fact Sheet for Patients: EntrepreneurPulse.com.au  Fact Sheet for Healthcare Providers: IncredibleEmployment.be  This test is not yet approved or cleared by the [place] FDA and has been authorized for detection and/or diagnosis of SARS-CoV-2 by FDA under an Emergency Use Authorization (EUA). This EUA will remain in effect (meaning this test can be used) for the duration of the COVID-19 declaration under Section 564(b)(1) of the Act, 21 U.S.C. section 360bbb-3(b)(1), unless the authorization is terminated or revoked.  Performed at [facility], [address] [place],  [place] [address]   Culture, blood (routine x 2)     Status: None (Preliminary result)   Collection Time: 10/11/21 10:53 AM   Specimen: BLOOD  Result Value Ref Range Status   Specimen Description   Final    BLOOD RIGHT ANTECUBITAL Performed at [facility] [address] [place], [place] [address]    Special Requests   Final    BOTTLES DRAWN AEROBIC AND ANAEROBIC Blood Culture adequate volume Performed at [facility] [address] [place], [place] [address]    Culture   Final    NO GROWTH < 24 HOURS Performed at [facility] [address], [place], [place] [address]    Report Status PENDING  Incomplete        Radiology Studies: CT Angio Chest PE W and/or Wo Contrast  Result Date: 10/11/2021 CLINICAL DATA:  History of lung cancer, elevated D-dimer, shortness of breath, wheezing EXAM: CT ANGIOGRAPHY CHEST WITH  CONTRAST TECHNIQUE: Multidetector CT imaging of the chest was performed using the standard protocol during bolus administration of intravenous contrast. Multiplanar CT image reconstructions and MIPs were obtained to evaluate the vascular anatomy. CONTRAST:  70mL OMNIPAQUE IOHEXOL 350 MG/ML SOLN COMPARISON:  CT chest 10/04/2021 FINDINGS: Cardiovascular: The pulmonary arteries are adequately opacified to the segmental level. There is no evidence of pulmonary embolism. The heart is stable in size. There is no pericardial effusion. There is mild calcified atherosclerotic plaque of the thoracic aorta. The main pulmonary artery is enlarged measuring up to 3.6 cm. Mediastinum/Nodes: The thyroid is unremarkable. The esophagus is grossly unremarkable. There is no mediastinal lymphadenopathy. A 7 mm left hilar lymph node is unchanged. There is no pathologically enlarged mediastinal or hilar lymph node. There is no axillary lymphadenopathy. Lungs/Pleura: The trachea and main airways are patent, though there is moderate narrowing of the right main bronchus, similar to the prior study. There is unchanged masslike consolidative opacity in the medial right upper lobe/suprahilar region abutting the mediastinum. A focus of nodular opacity in the right apex superolateral to the dominant consolidation measuring 1.0 cm is unchanged compared to the most recent study but is increased in conspicuity compared to the remote study from 03/11/2021. There is patchy ground-glass opacity and interlobular septal thickening predominantly in the left lung. There are patchy nodular opacities in the left apex which are new since 10/04/2021. There is partial collapse of the medial right lower lobe which is new since 10/04/2021. The collapsed lung demonstrates postcontrast enhancement. There is a moderate right pleural effusion, increased in size since 10/05/2020. There is no left pleural effusion. There is no pneumothorax. Upper Abdomen: A left renal  cyst is partially imaged. A small right adrenal nodule is unchanged. The imaged portions of the upper abdominal viscera are otherwise unremarkable. Musculoskeletal: There is multilevel degenerative change of the thoracic spine. There is no acute osseous abnormality or aggressive osseous lesion. Review of the MIP images confirms the above findings. IMPRESSION: 1. No evidence of pulmonary embolism. 2. Increased size of a moderate right pleural effusion with new partial collapse of the medial right lower lobe. 3. New ground-glass opacity and interlobular septal thickening predominantly in the left lung suggesting mild to moderate pulmonary interstitial edema. 4. New patchy nodular opacities in the left apex may be infectious or inflammatory in nature. 5. Post treatment change in the right upper lobe is again seen. Focal nodular opacity in the superior right apex is unchanged compared to the most recent study but  is increasing in conspicuity compared to more remote studies. 6. Recommend close interval follow-up in 1-3 months to reassess the above findings. Electronically Signed   By: Valetta Mole M.D.   On: 10/11/2021 12:51   DG Chest Port 1 View  Result Date: 10/12/2021 CLINICAL DATA:  70 year old male status post thoracentesis EXAM: PORTABLE CHEST 1 VIEW COMPARISON:  Chest x-ray 10/11/2021 FINDINGS: No evidence of pneumothorax. Decreased right-sided pleural effusion. Right IJ single-lumen power injectable port catheter remains in unchanged position. No acute osseous abnormality. IMPRESSION: No evidence of pneumothorax or other complication following right thoracentesis. Electronically Signed   By: Jacqulynn Cadet M.D.   On: 10/12/2021 13:06   DG Chest Port 1 View  Result Date: 10/11/2021 CLINICAL DATA:  Dyspnea, wheezing EXAM: PORTABLE CHEST 1 VIEW COMPARISON:  Chest radiograph 09/17/2021, CT chest 10/04/2021 FINDINGS: A right chest wall port is in stable position. The cardiomediastinal silhouette is  grossly stable. There is unchanged asymmetric elevation of the right hemidiaphragm. Confluent opacity in the medial right lung with associated architectural distortion and volume loss is similar to the prior chest radiograph and CT. The left lung appears clear. A small amount of pleural fluid is again suspected in the right apex, but the right costophrenic angle appears sharp. There is no left pleural effusion. There is no pneumothorax. IMPRESSION: Unchanged right hilar opacity with associated architectural distortion. No evidence of new/acute pulmonary process. Electronically Signed   By: Valetta Mole M.D.   On: 10/11/2021 11:04   ECHOCARDIOGRAM COMPLETE  Result Date: 10/12/2021    ECHOCARDIOGRAM REPORT   Patient Name:   [facility] S[facility] Date of Exam: 10/12/2021 Medical Rec #:  [id number]    Height:       69.0 in Accession #:    [id number]   Weight:       291.0 lb Date of Birth:  1951-10-11    BSA:          2.423 m Patient Age:    30 years     BP:           132/84 mmHg Patient Gender: M            HR:           85 bpm. Exam Location:  Inpatient Procedure: 2D Echo, Color Doppler and Cardiac Doppler Indications:    CHF  History:        Patient has prior history of Echocardiogram examinations. Risk                 Factors:Diabetes, Sleep Apnea and Hypertension.  Sonographer:    Jyl Heinz Referring Phys: [id number] [place]  1. Left ventricular ejection fraction, by estimation, is 50 to 55%. The left ventricle has low normal function. The left ventricle has no regional wall motion abnormalities. There is mild left ventricular hypertrophy. Left ventricular diastolic parameters are indeterminate.  2. Right ventricular systolic function is normal. The right ventricular size is normal.  3. Left atrial size was mildly dilated.  4. The mitral valve is normal in structure. No evidence of mitral valve regurgitation. No evidence of mitral stenosis.  5. The aortic valve is tricuspid. Aortic valve regurgitation  is not visualized. No aortic stenosis is present.  6. Aortic dilatation noted. There is borderline dilatation of the aortic root, measuring 38 mm. There is borderline dilatation of the ascending aorta, measuring 38 mm.  7. The inferior vena cava is normal in size with greater than 50% respiratory variability, suggesting  right atrial pressure of 3 mmHg. FINDINGS  Left Ventricle: Left ventricular ejection fraction, by estimation, is 50 to 55%. The left ventricle has low normal function. The left ventricle has no regional wall motion abnormalities. The left ventricular internal cavity size was normal in size. There is mild left ventricular hypertrophy. Left ventricular diastolic parameters are indeterminate. Right Ventricle: The right ventricular size is normal. Right ventricular systolic function is normal. Left Atrium: Left atrial size was mildly dilated. Right Atrium: Right atrial size was normal in size. Pericardium: There is no evidence of pericardial effusion. Mitral Valve: The mitral valve is normal in structure. No evidence of mitral valve regurgitation. No evidence of mitral valve stenosis. Tricuspid Valve: The tricuspid valve is normal in structure. Tricuspid valve regurgitation is trivial. No evidence of tricuspid stenosis. Aortic Valve: The aortic valve is tricuspid. Aortic valve regurgitation is not visualized. No aortic stenosis is present. Aortic valve peak gradient measures 8.4 mmHg. Pulmonic Valve: The pulmonic valve was not well visualized. Pulmonic valve regurgitation is not visualized. No evidence of pulmonic stenosis. Aorta: Aortic dilatation noted. There is borderline dilatation of the aortic root, measuring 38 mm. There is borderline dilatation of the ascending aorta, measuring 38 mm. Venous: The inferior vena cava is normal in size with greater than 50% respiratory variability, suggesting right atrial pressure of 3 mmHg. IAS/Shunts: No atrial level shunt detected by color flow Doppler.  LEFT  VENTRICLE PLAX 2D LVIDd:         4.90 cm      Diastology LVIDs:         3.50 cm      LV e' medial:    14.00 cm/s LV PW:         1.20 cm      LV E/e' medial:  10.0 LV IVS:        1.30 cm      LV e' lateral:   18.50 cm/s LVOT diam:     2.00 cm      LV E/e' lateral: 7.6 LV SV:         81 LV SV Index:   34 LVOT Area:     3.14 cm  LV Volumes (MOD) LV vol d, MOD A2C: 143.0 ml LV vol d, MOD A4C: 155.0 ml LV vol s, MOD A2C: 69.7 ml LV vol s, MOD A4C: 73.1 ml LV SV MOD A2C:     73.3 ml LV SV MOD A4C:     155.0 ml LV SV MOD BP:      78.5 ml RIGHT VENTRICLE            IVC RV Basal diam:  3.60 cm    IVC diam: 2.20 cm RV Mid diam:    3.30 cm RV S prime:     9.08 cm/s TAPSE (M-mode): 1.6 cm LEFT ATRIUM             Index        RIGHT ATRIUM           Index LA diam:        3.90 cm 1.61 cm/m   RA Area:     20.80 cm LA Vol (A2C):   85.1 ml 35.12 ml/m  RA Volume:   61.80 ml  25.51 ml/m LA Vol (A4C):   67.7 ml 27.94 ml/m LA Biplane Vol: 81.8 ml 33.76 ml/m  AORTIC VALVE AV Area (Vmax): 2.66 cm AV Vmax:        145.00 cm/s AV Peak Grad:  8.4 mmHg LVOT Vmax:      123.00 cm/s LVOT Vmean:     88.850 cm/s LVOT VTI:       0.259 m  AORTA Ao Root diam: 3.80 cm Ao Asc diam:  3.80 cm MITRAL VALVE MV Area (PHT): 5.16 cm     SHUNTS MV Decel Time: 147 msec     Systemic VTI:  0.26 m MV E velocity: 140.00 cm/s  Systemic Diam: 2.00 cm Kirk Ruths MD Electronically signed by Kirk Ruths MD Signature Date/Time: 10/12/2021/11:27:54 AM    Final    US THORACENTESIS ASP PLEURAL SPACE W/IMG GUIDE  Result Date: 10/12/2021 INDICATION: 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 Request for IR to perform diagnostic and therapeutic thoracentesis for right-sided pleural effusion. EXAM: ULTRASOUND GUIDED diagnostic and therapeutic THORACENTESIS MEDICATIONS: 2ml 1% lidocaine COMPLICATIONS: None immediate. PROCEDURE: An ultrasound guided thoracentesis was thoroughly discussed with the patient and questions  answered. The benefits, risks, alternatives and complications were also discussed. The patient understands and wishes to proceed with the procedure. Written consent was obtained. Ultrasound was performed to localize and mark an adequate pocket of fluid in the right chest. The area was then prepped and draped in the normal sterile fashion. 1% Lidocaine was used for local anesthesia. Under ultrasound guidance a 6 Fr Safe-T-Centesis catheter was introduced. Thoracentesis was performed. The catheter was removed and a dressing applied. FINDINGS: A total of approximately 1.2 L of hazy, amber fluid was removed. Samples were sent to the laboratory as requested by the clinical team. IMPRESSION: Successful ultrasound guided right thoracentesis yielding 1.2 L of pleural fluid. Read by: Narda Rutherford, NP Electronically Signed   By: Albin Felling M.D.   On: 10/12/2021 12:27        Scheduled Meds:  amLODipine  10 mg Oral Daily   apixaban  5 mg Oral BID   docusate sodium  100 mg Oral BID   gabapentin  300 mg Oral QHS   guaiFENesin  600 mg Oral BID   insulin aspart  0-15 Units Subcutaneous TID WC   insulin aspart  0-5 Units Subcutaneous QHS   insulin glargine-yfgn  20 Units Subcutaneous QHS   ipratropium-albuterol  3 mL Nebulization TID   melatonin  10 mg Oral QHS   multivitamin with minerals  1 tablet Oral Daily   pravastatin  40 mg Oral q1800   predniSONE  40 mg Oral Q breakfast   Continuous Infusions:  azithromycin Stopped (10/12/21 0042)   cefTRIAXone (ROCEPHIN)  IV Stopped (10/11/21 2335)     LOS: 0 days     Cordelia Poche, MD [facility] 10/12/2021, 4:30 PM  If 7PM-7AM, please contact night-coverage [email or web address]

## 2021-10-13 DIAGNOSIS — J9621 Acute and chronic respiratory failure with hypoxia: Secondary | ICD-10-CM | POA: Diagnosis not present

## 2021-10-13 DIAGNOSIS — G4733 Obstructive sleep apnea (adult) (pediatric): Secondary | ICD-10-CM | POA: Diagnosis not present

## 2021-10-13 DIAGNOSIS — J441 Chronic obstructive pulmonary disease with (acute) exacerbation: Secondary | ICD-10-CM | POA: Diagnosis not present

## 2021-10-13 DIAGNOSIS — J90 Pleural effusion, not elsewhere classified: Secondary | ICD-10-CM

## 2021-10-13 LAB — GLUCOSE, CAPILLARY
Glucose-Capillary: 146 mg/dL — ABNORMAL HIGH (ref 70–99)
Glucose-Capillary: 201 mg/dL — ABNORMAL HIGH (ref 70–99)

## 2021-10-13 LAB — BASIC METABOLIC PANEL
Anion gap: 8 (ref 5–15)
BUN: 32 mg/dL — ABNORMAL HIGH (ref 8–23)
CO2: 28 mmol/L (ref 22–32)
Calcium: 8.7 mg/dL — ABNORMAL LOW (ref 8.9–10.3)
Chloride: 100 mmol/L (ref 98–111)
Creatinine, Ser: 1.28 mg/dL — ABNORMAL HIGH (ref 0.61–1.24)
GFR, Estimated: 60 mL/min — ABNORMAL LOW (ref >60)
Glucose, Bld: 165 mg/dL — ABNORMAL HIGH (ref 70–99)
Potassium: 4.8 mmol/L (ref 3.5–5.1)
Sodium: 136 mmol/L (ref 135–145)

## 2021-10-13 MED ORDER — IPRATROPIUM-ALBUTEROL 0.5-2.5 (3) MG/3ML IN SOLN: RESPIRATORY_TRACT | 0 refills | Status: DC

## 2021-10-13 MED ORDER — PREDNISONE 20 MG PO TABS: 40.0000 mg | ORAL_TABLET | Freq: Every day | ORAL | 0 refills | Status: AC

## 2021-10-13 MED ORDER — AZITHROMYCIN 250 MG PO TABS: 500.0000 mg | ORAL_TABLET | Freq: Every day | ORAL | Status: DC

## 2021-10-13 MED ORDER — HEPARIN SOD (PORK) LOCK FLUSH 100 UNIT/ML IV SOLN
500.0000 [IU] | INTRAVENOUS | Status: AC | PRN
  Administered 2021-10-13: 500 [IU]
  Filled 2021-10-13: qty 5

## 2021-10-13 MED ORDER — CEFPODOXIME PROXETIL 200 MG PO TABS: 200.0000 mg | ORAL_TABLET | Freq: Two times a day (BID) | ORAL | 0 refills | Status: AC

## 2021-10-13 MED ORDER — AZITHROMYCIN 500 MG PO TABS: 500.0000 mg | ORAL_TABLET | Freq: Every day | ORAL | 0 refills | Status: AC

## 2021-10-13 NOTE — Discharge Instructions (Signed)
[name],  You are in the hospital with significant trouble breathing.  There is concern for possible pneumonia in addition to possible COPD exacerbation.  You are started on antibiotics and steroids/breathing treatments to help with her symptoms.  He also received a thoracentesis for your recurrent pleural effusion which is helped tremendously and your ability to breathe well.  I will discharge you with continue antibiotics in addition to continuation of your steroid burst.  I have ordered a nebulizer machine so we can use this as an outpatient at home.  While you are here, you also had a blood culture to look for any infection in your bloodstream; this presented a positive result but thankfully the type of bacteria is likely a contaminant.  I discussed this result with infectious disease doctor who agreed.  You will need no antibiotics to treat this positive blood culture result.

## 2021-10-13 NOTE — Progress Notes (Signed)
RN reviewed discharge paperwork with the patient who stated understanding . Pt was in possession of all belongings. NT transported pt via wheelchair to the entrance.

## 2021-10-13 NOTE — Progress Notes (Signed)
Patient's PAC access was occluded. Reaccessed PAC then good blood return noticed. Applied heparin flush 500 units due to d/c home. HS [employer]

## 2021-10-13 NOTE — Evaluation (Signed)
Occupational Therapy Evaluation Patient Details Name: Adam Wise MRN: [id number] DOB: 1951-02-27 Today's Date: 10/13/2021   History of Present Illness Patient is a 70 year old male who presented to the hosptial with dyspnea. Patient was admitted with sepsis, acute on chronic respiratory failure with hypoxia, and pleural effusion. patietn underwent R thoracentesis with 1.2L removed. PMH: stage 4 lung cancer, HTN, GERD, morbid obesity.   Clinical Impression   Patient evaluated by Occupational Therapy with no further acute OT needs identified. All education has been completed and the patient has no further questions. Patient is MI for LB dressing, toileting and functional mobility in room. Patient endorses being at baseline for ADLs at this time. Patient declined functional mobility in hallway but reports he will go on walk later.  Patient was educated on ECT for home. See below for any follow-up Occupational Therapy or equipment needs. OT is signing off. Thank you for this referral.       Recommendations for follow up therapy are one component of a multi-disciplinary discharge planning process, led by the attending physician.  Recommendations may be updated based on patient status, additional functional criteria and insurance authorization.   Follow Up Recommendations  No OT follow up    Assistance Recommended at Discharge    Functional Status Assessment  Patient has not had a recent decline in their functional status  Equipment Recommendations  None recommended by OT    Recommendations for Other Services       Precautions / Restrictions Precautions Precaution Comments: central line Restrictions Weight Bearing Restrictions: No      Mobility Bed Mobility Overal bed mobility: Modified Independent                  Transfers Overall transfer level: Modified independent Equipment used: None                      Balance Overall balance assessment: No apparent  balance deficits (not formally assessed)                                         ADL either performed or assessed with clinical judgement   ADL                                               Vision Baseline Vision/History: 1 Wears glasses Ability to See in Adequate Light: 1 Impaired Patient Visual Report: No change from baseline Vision Assessment?: No apparent visual deficits     Perception     Praxis      Pertinent Vitals/Pain Pain Assessment: No/denies pain     Hand Dominance Right   Extremity/Trunk Assessment Upper Extremity Assessment Upper Extremity Assessment: RUE deficits/detail;LUE deficits/detail RUE Deficits / Details: attempts at rehab shoulder x2 still able to functionally participate in ADLs LUE Deficits / Details: patient reported having "bad shoulders" with attempts at rehabs 3x   Lower Extremity Assessment Lower Extremity Assessment: Overall WFL for tasks assessed   Cervical / Trunk Assessment Cervical / Trunk Assessment: Normal   Communication Communication Communication: No difficulties   Cognition Arousal/Alertness: Awake/alert Behavior During Therapy: WFL for tasks assessed/performed Overall Cognitive Status: Within Functional Limits for tasks assessed  General Comments       Exercises     Shoulder Instructions      Home Living Family/patient expects to be discharged to:: Private residence Living Arrangements: Spouse/significant other Available Help at Discharge: Family;Available PRN/intermittently Type of Home: House Home Access: Stairs to enter Entrance Stairs-Number of Steps: 4 Entrance Stairs-Rails:  (one side) Home Layout: Two level;Able to live on main level with bedroom/bathroom     Bathroom Shower/Tub: Walk-in shower         Home Equipment: Grab bars - tub/shower;Shower seat - built in          Prior Functioning/Environment  Prior Level of Function : Independent/Modified Independent             Mobility Comments: no AD ADLs Comments: was independent. patient was riding on lawn mower prior to hospitalization        OT Problem List: Decreased activity tolerance;Decreased safety awareness      OT Treatment/Interventions:      OT Goals(Current goals can be found in the care plan section) Acute Rehab OT Goals OT Goal Formulation: All assessment and education complete, DC therapy  OT Frequency:     Barriers to D/C:            Co-evaluation              AM-PAC OT "6 Clicks" Daily Activity     Outcome Measure Help from another person eating meals?: None Help from another person taking care of personal grooming?: None Help from another person toileting, which includes using toliet, bedpan, or urinal?: None Help from another person bathing (including washing, rinsing, drying)?: None Help from another person to put on and taking off regular upper body clothing?: None Help from another person to put on and taking off regular lower body clothing?: None 6 Click Score: 24   End of Session Nurse Communication: Other (comment) (cleared patient to participate in session)  Activity Tolerance: Patient tolerated treatment well Patient left: in chair;with call bell/phone within reach  OT Visit Diagnosis: Unsteadiness on feet (R26.81);Muscle weakness (generalized) (M62.81)                Time: 0712-1975 OT Time Calculation (min): 20 min Charges:  OT General Charges $OT Visit: 1 Visit OT Evaluation $OT Eval Low Complexity: 1 Low  Leota Sauers, MS Acute Rehabilitation Department Office# [phone] Pager# [phone]   Merlene Morse Alberta Lenhard 10/13/2021, 8:59 AM

## 2021-10-13 NOTE — Discharge Summary (Signed)
Physician Discharge Summary  Adam Wise OFB:[id number] DOB: [birth year] DOA: 10/11/2021  PCP: [name], NP  Admit date: 10/11/2021 Discharge date: 10/13/2021  Admitted From: Home Disposition: Home  Recommendations for Outpatient Follow-up:  Follow up with PCP in 1 week Please obtain BMP/CBC in one week Please follow up on the following pending results: Pleural fluid cytology  Home Health: None Equipment/Devices: Nebulizer machine  Discharge Condition: Stable CODE STATUS: Full code Diet recommendation: Carb modified   Brief/Interim Summary:  Admission HPI written by Jonnie Finner, DO   HPI: Adam Wise is a 70 y.o. 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 Presenting with dyspnea. Over the past  he has noticed a worsening of his chronic shortness of breath. He has talked with his oncologist about it. He has taken increased tussionex. This morning, he woke up and could not catch his breath. He felt like someone placed a plastic bag over his head. He got his pulse oximeter and it showed his SpO2 was down to 79. He had never seen that low a reading before, so he called for EMS. EMS gave him albuterol, atrovent, solumedrol and [place] en route to the ED. He denies any other aggravating or alleviating factors.   Hospital course:  Acute respiratory failure Patient states he is not currently on oxygen but was recently requiring 2 L/min as an outpatient. Patient requiring up to 3 L/min during this hospitalization. CTA chest significant for ground-glass opacity in addition to interstitial edema and right pleural effusion. Symptoms greatly improved after right thoracentesis with patient not requiring oxygen evne with ambulation.    Possible COPD exacerbation No wheezing on exam this morning. Started on Prednisone and Dunebs with improvement of symptoms. Discharged to completed 5 days of prednisone. Nebulizer machine ordered for  home use.   Possible Pneumonia Patient with new ground-glass opacity on CTA chest. No associated fever or leukocytosis. Started empirically on Ceftriaxone and azithromycin with improvement. Transition to Cefpodoxime and azithromycin on discharge.   Sepsis Presumed source of pneumonia. Present on admission. Blood cultures positive (2/4) for staph species but likely a contaminant as mentioned below.   Positive blood culture BICD positive for staphylococcus species. Unsure if this is a real infection vs contaminant. No evidence of methicillin resistance on BCID. Culture resulted with staphylococcus hemolyticus. Discussed with Infectious Disease who agreed this is likely a contaminant and does not need treatment.   Right pleural effusion Recurrent. Possibly malignant effusion but do not see cytology in history. Possibly contributing to symptoms. Thoracentesis performed on 10/29 with initial cell count significant for inflammation. Cytology pending on discharge.   Primary hypertension Continue home regimen   Diabetes mellitus, type 2 Continue home regimen   History of PE Continue Eliquis   Metastatic small cell lung cancer Patient on chemotherapy. Patient follows with oncology as an outpatient.   OSA Continue CPAP qhs   Morbid obesity Body mass index is 42.97 kg/m.  Discharge Diagnoses:  Principal Problem:   Acute on chronic respiratory failure with hypoxia (HCC) Active Problems:   Type 2 diabetes mellitus (HCC)   OSA (obstructive sleep apnea)   COPD exacerbation [facility])    Discharge Instructions  Discharge Instructions     Call MD for:  difficulty breathing, headache or visual disturbances   Complete by: As directed       Allergies as of 10/13/2021       Reactions   Bupropion  Racing heart   Hydrochlorothiazide Other (See Comments)   Cramping to lower extremities        Medication List     TAKE these medications    albuterol 108 (90 Base) MCG/ACT  inhaler Commonly known as: VENTOLIN HFA Inhale 2 puffs into the lungs every 6 (six) hours as needed for wheezing or shortness of breath.   amLODipine 10 MG tablet Commonly known as: NORVASC TAKE 1 TABLET BY MOUTH EVERY DAY for blood pressure. What changed:  how much to take how to take this when to take this additional instructions   azithromycin 500 MG tablet Commonly known as: ZITHROMAX Take 1 tablet (500 mg total) by mouth daily for 3 days.   B-D 3CC LUER-LOK SYR 22GX1" 22G X 1" 3 ML Misc Generic drug: SYRINGE-NEEDLE (DISP) 3 ML USE AS INSTRUCTED FOR TESTOSTERONE INJECTION EVERY 2 WEEKS   Basaglar KwikPen 100 UNIT/ML Inject 20 Units into the skin at bedtime. For diabetes.   BD Pen Needle Nano U/F 32G X 4 MM Misc Generic drug: Insulin Pen Needle Use with insulin as prescribed  Dx Code: E11.9   blood glucose meter kit and supplies Kit Dispense based on patient and insurance preference. Use up to four times daily as directed.   cefpodoxime 200 MG tablet Commonly known as: VANTIN Take 1 tablet (200 mg total) by mouth 2 (two) times daily for 3 days.   chlorpheniramine-HYDROcodone 10-8 MG/5ML Suer Commonly known as: TUSSIONEX Take 5 mLs by mouth at bedtime as needed for cough.   Eliquis 5 MG Tabs tablet Generic drug: apixaban TAKE 1 TABLET BY MOUTH TWICE A DAY What changed: how much to take   esomeprazole 20 MG capsule Commonly known as: NEXIUM Take 20 mg by mouth daily.   gabapentin 300 MG capsule Commonly known as: NEURONTIN Take 300 mg by mouth at bedtime.   glipiZIDE 10 MG tablet Commonly known as: GLUCOTROL TAKE 1 TABLET (10 MG TOTAL) BY MOUTH 2 (TWO) TIMES DAILY BEFORE A MEAL. FOR DIABETES. What changed: additional instructions   ipratropium-albuterol 0.5-2.5 (3) MG/3ML Soln Commonly known as: DUONEB Take 3 mLs by nebulization every 4 (four) hours while awake for 3 days, THEN 3 mLs every 4 (four) hours as needed (shortness of breath or wheezing). Start  taking on: October 13, 2021   Lancets Misc USE UP TO 3 TIMES DAILY AS DIRECTED   lidocaine-prilocaine cream Commonly known as: EMLA Apply 1 application topically as needed. What changed: reasons to take this   losartan 25 MG tablet Commonly known as: COZAAR Take 1 tablet (25 mg total) by mouth daily. For blood pressure.   Melatonin 10 MG Caps Take 10 mg by mouth at bedtime.   metFORMIN 1000 MG tablet Commonly known as: GLUCOPHAGE TAKE 1 TABLET (1,000 MG TOTAL) BY MOUTH 2 (TWO) TIMES DAILY WITH A MEAL. FOR DIABETES What changed: additional instructions   Multi-Vitamins Tabs Take 1 tablet by mouth daily.   OneTouch Ultra test strip Generic drug: glucose blood USE UP TO 4 TIMES DAILY AS DIRECTED   OVER THE COUNTER MEDICATION Take by mouth.   oxymetazoline 0.05 % nasal spray Commonly known as: AFRIN Place 1 spray into both nostrils 2 (two) times daily as needed for congestion.   pioglitazone 45 MG tablet Commonly known as: ACTOS TAKE 1 TABLET (45 MG TOTAL) BY MOUTH DAILY. FOR DIABETES. What changed: additional instructions   pravastatin 40 MG tablet Commonly known as: PRAVACHOL TAKE 1 TABLET BY MOUTH EVERY DAY IN THE  EVENING FOR CHOLESTEROL What changed: See the new instructions.   predniSONE 20 MG tablet Commonly known as: DELTASONE Take 2 tablets (40 mg total) by mouth daily with breakfast for 2 days. Start taking on: October 14, 2021   pseudoephedrine-acetaminophen 30-500 MG Tabs tablet Commonly known as: TYLENOL SINUS Take 2 tablets by mouth at bedtime.   SALMON OIL PO Take 1 capsule by mouth daily.   testosterone cypionate 200 MG/ML injection Commonly known as: DEPOTESTOSTERONE CYPIONATE Inject 200 mg into the muscle every 14 (fourteen) days.   TURMERIC COMPLEX/BLACK PEPPER PO Take 1 tablet by mouth in the morning and at bedtime.               Durable Medical Equipment  (From admission, onward)           Start     Ordered   10/13/21  1152  For home use only DME Nebulizer machine  Once       Question Answer Comment  Patient needs a nebulizer to treat with the following condition COPD exacerbation ([place])   Patient needs a nebulizer to treat with the following condition COPD (chronic obstructive pulmonary disease) ([place])   Length of Need Lifetime      10/13/21 1151            Follow-up Information     [name], NP. Schedule an appointment as soon as possible for a visit.   Specialty: Internal Medicine Why: For hospital follow-up Contact information: [place] [address] [phone]                Allergies  Allergen Reactions   Bupropion     Racing heart   Hydrochlorothiazide Other (See Comments)    Cramping to lower extremities    Consultations: None   Procedures/Studies: DG Chest 2 View  Result Date: 09/17/2021 CLINICAL DATA:  History of small cell lung cancer EXAM: CHEST - 2 VIEW COMPARISON:  Chest x-ray dated August 12, 2021 FINDINGS: Right chest wall port with tip projecting over the expected area of the cavoatrial junction. Visualized cardiac and mediastinal contours are unchanged. Paramediastinal opacities of the right lung, unchanged compared to prior and likely post treatment change. Left lung is clear. Unchanged elevation of the right hemidiaphragm. Small amount of apical pleural fluid. IMPRESSION: Stable chest x-ray when compared with most recent prior. Electronically Signed   By: Yetta Glassman M.D.   On: 09/17/2021 14:58   CT Chest W Contrast  Result Date: 10/07/2021 CLINICAL DATA:  Metastatic lung cancer restaging EXAM: CT CHEST, ABDOMEN, AND PELVIS WITH CONTRAST TECHNIQUE: Multidetector CT imaging of the chest, abdomen and pelvis was performed following the standard protocol during bolus administration of intravenous contrast. CONTRAST:  36mL OMNIPAQUE IOHEXOL 350 MG/ML SOLN, additional oral enteric contrast COMPARISON:  08/07/2021, 05/16/2021 FINDINGS:  CT CHEST FINDINGS Cardiovascular: Right chest port catheter. Aortic atherosclerosis. Normal heart size. Three-vessel coronary artery calcifications. No pericardial effusion. Mediastinum/Nodes: Unchanged posttreatment appearance of soft tissue about the right hilum (series 2, image 25). No discretely enlarged mediastinal, hilar, or axillary lymph nodes. Thyroid gland, trachea, and esophagus demonstrate no significant findings. Lungs/Pleura: Unchanged posttreatment/postradiation appearance of the right lung with perihilar and suprahilar fibrosis and consolidation. Moderate right pleural effusion, not significantly changed compared to prior examination. Occasional tiny pulmonary nodules are unchanged, for example a 2 mm nodule in the superior segment left lower lobe (series 4, image 70) and a 2 mm nodule in the peripheral left upper lobe (  series 4, image 45). Musculoskeletal: No chest wall mass or suspicious bone lesions identified. CT ABDOMEN PELVIS FINDINGS Hepatobiliary: Multiple small hypodense liver lesions are unchanged, for example a subcapsular lesion of the anterior right lobe of the liver measuring 1.7 x 0.9 cm (series 2, image 59) and a lesion of the inferior left lobe measuring 1.2 x 1.0 cm (series 3, image 54). No gallstones, gallbladder wall thickening, or biliary dilatation. Pancreas: Unremarkable. No pancreatic ductal dilatation or surrounding inflammatory changes. Spleen: Normal in size without significant abnormality. Adrenals/Urinary Tract: Unchanged nodule of the right adrenal gland measuring 1.7 x 1.5 cm (series 2, image 57). Exophytic simple cysts in the right kidney. Kidneys are otherwise normal, without renal calculi, solid lesion, or hydronephrosis. Thickening of the urinary bladder multiple likely related to chronic outlet obstruction. Stomach/Bowel: Stomach is within normal limits. Appendix appears normal. No evidence of bowel wall thickening, distention, or inflammatory changes. Sigmoid  diverticula. Vascular/Lymphatic: Aortic atherosclerosis. No enlarged abdominal or pelvic lymph nodes. Reproductive: Mild prostatomegaly. Other: No abdominal wall hernia or abnormality. No abdominopelvic ascites. Musculoskeletal: No acute or significant osseous findings. Chronic bilateral pars defects of L5. IMPRESSION: 1. Unchanged posttreatment/postradiation appearance of the right lung with perihilar and suprahilar fibrosis and consolidation. Moderate right pleural effusion, not significantly changed compared to prior examination. 2. Multiple small hypodense liver lesions are unchanged, consistent with stable hepatic metastatic disease. 3. Occasional tiny pulmonary nodules are unchanged, measuring 2 mm and smaller, almost certainly benign and incidental, continued attention on follow-up. 4. Unchanged nodule of the right adrenal gland measuring 1.7 x 1.5 cm, which remains nonspecific but likely a small, incidental adenoma. Continued attention on follow-up. 5. No evidence of new metastatic disease in the chest, abdomen, or pelvis. 6. Prostatomegaly. 7. Coronary artery disease. Aortic Atherosclerosis (ICD10-I70.0). Electronically Signed   By: Delanna Ahmadi M.D.   On: 10/07/2021 11:14   CT Angio Chest PE W and/or Wo Contrast  Result Date: 10/11/2021 CLINICAL DATA:  History of lung cancer, elevated D-dimer, shortness of breath, wheezing EXAM: CT ANGIOGRAPHY CHEST WITH CONTRAST TECHNIQUE: Multidetector CT imaging of the chest was performed using the standard protocol during bolus administration of intravenous contrast. Multiplanar CT image reconstructions and MIPs were obtained to evaluate the vascular anatomy. CONTRAST:  32mL OMNIPAQUE IOHEXOL 350 MG/ML SOLN COMPARISON:  CT chest 10/04/2021 FINDINGS: Cardiovascular: The pulmonary arteries are adequately opacified to the segmental level. There is no evidence of pulmonary embolism. The heart is stable in size. There is no pericardial effusion. There is mild  calcified atherosclerotic plaque of the thoracic aorta. The main pulmonary artery is enlarged measuring up to 3.6 cm. Mediastinum/Nodes: The thyroid is unremarkable. The esophagus is grossly unremarkable. There is no mediastinal lymphadenopathy. A 7 mm left hilar lymph node is unchanged. There is no pathologically enlarged mediastinal or hilar lymph node. There is no axillary lymphadenopathy. Lungs/Pleura: The trachea and main airways are patent, though there is moderate narrowing of the right main bronchus, similar to the prior study. There is unchanged masslike consolidative opacity in the medial right upper lobe/suprahilar region abutting the mediastinum. A focus of nodular opacity in the right apex superolateral to the dominant consolidation measuring 1.0 cm is unchanged compared to the most recent study but is increased in conspicuity compared to the remote study from 03/11/2021. There is patchy ground-glass opacity and interlobular septal thickening predominantly in the left lung. There are patchy nodular opacities in the left apex which are new since 10/04/2021. There is partial collapse of the medial  right lower lobe which is new since 10/04/2021. The collapsed lung demonstrates postcontrast enhancement. There is a moderate right pleural effusion, increased in size since 10/05/2020. There is no left pleural effusion. There is no pneumothorax. Upper Abdomen: A left renal cyst is partially imaged. A small right adrenal nodule is unchanged. The imaged portions of the upper abdominal viscera are otherwise unremarkable. Musculoskeletal: There is multilevel degenerative change of the thoracic spine. There is no acute osseous abnormality or aggressive osseous lesion. Review of the MIP images confirms the above findings. IMPRESSION: 1. No evidence of pulmonary embolism. 2. Increased size of a moderate right pleural effusion with new partial collapse of the medial right lower lobe. 3. New ground-glass opacity and  interlobular septal thickening predominantly in the left lung suggesting mild to moderate pulmonary interstitial edema. 4. New patchy nodular opacities in the left apex may be infectious or inflammatory in nature. 5. Post treatment change in the right upper lobe is again seen. Focal nodular opacity in the superior right apex is unchanged compared to the most recent study but is increasing in conspicuity compared to more remote studies. 6. Recommend close interval follow-up in 1-3 months to reassess the above findings. Electronically Signed   By: Valetta Mole M.D.   On: 10/11/2021 12:51   CT Abdomen Pelvis W Contrast  Result Date: 10/07/2021 CLINICAL DATA:  Metastatic lung cancer restaging EXAM: CT CHEST, ABDOMEN, AND PELVIS WITH CONTRAST TECHNIQUE: Multidetector CT imaging of the chest, abdomen and pelvis was performed following the standard protocol during bolus administration of intravenous contrast. CONTRAST:  63mL OMNIPAQUE IOHEXOL 350 MG/ML SOLN, additional oral enteric contrast COMPARISON:  08/07/2021, 05/16/2021 FINDINGS: CT CHEST FINDINGS Cardiovascular: Right chest port catheter. Aortic atherosclerosis. Normal heart size. Three-vessel coronary artery calcifications. No pericardial effusion. Mediastinum/Nodes: Unchanged posttreatment appearance of soft tissue about the right hilum (series 2, image 25). No discretely enlarged mediastinal, hilar, or axillary lymph nodes. Thyroid gland, trachea, and esophagus demonstrate no significant findings. Lungs/Pleura: Unchanged posttreatment/postradiation appearance of the right lung with perihilar and suprahilar fibrosis and consolidation. Moderate right pleural effusion, not significantly changed compared to prior examination. Occasional tiny pulmonary nodules are unchanged, for example a 2 mm nodule in the superior segment left lower lobe (series 4, image 70) and a 2 mm nodule in the peripheral left upper lobe (series 4, image 45). Musculoskeletal: No chest wall  mass or suspicious bone lesions identified. CT ABDOMEN PELVIS FINDINGS Hepatobiliary: Multiple small hypodense liver lesions are unchanged, for example a subcapsular lesion of the anterior right lobe of the liver measuring 1.7 x 0.9 cm (series 2, image 59) and a lesion of the inferior left lobe measuring 1.2 x 1.0 cm (series 3, image 54). No gallstones, gallbladder wall thickening, or biliary dilatation. Pancreas: Unremarkable. No pancreatic ductal dilatation or surrounding inflammatory changes. Spleen: Normal in size without significant abnormality. Adrenals/Urinary Tract: Unchanged nodule of the right adrenal gland measuring 1.7 x 1.5 cm (series 2, image 57). Exophytic simple cysts in the right kidney. Kidneys are otherwise normal, without renal calculi, solid lesion, or hydronephrosis. Thickening of the urinary bladder multiple likely related to chronic outlet obstruction. Stomach/Bowel: Stomach is within normal limits. Appendix appears normal. No evidence of bowel wall thickening, distention, or inflammatory changes. Sigmoid diverticula. Vascular/Lymphatic: Aortic atherosclerosis. No enlarged abdominal or pelvic lymph nodes. Reproductive: Mild prostatomegaly. Other: No abdominal wall hernia or abnormality. No abdominopelvic ascites. Musculoskeletal: No acute or significant osseous findings. Chronic bilateral pars defects of L5. IMPRESSION: 1. Unchanged posttreatment/postradiation appearance of  the right lung with perihilar and suprahilar fibrosis and consolidation. Moderate right pleural effusion, not significantly changed compared to prior examination. 2. Multiple small hypodense liver lesions are unchanged, consistent with stable hepatic metastatic disease. 3. Occasional tiny pulmonary nodules are unchanged, measuring 2 mm and smaller, almost certainly benign and incidental, continued attention on follow-up. 4. Unchanged nodule of the right adrenal gland measuring 1.7 x 1.5 cm, which remains nonspecific but  likely a small, incidental adenoma. Continued attention on follow-up. 5. No evidence of new metastatic disease in the chest, abdomen, or pelvis. 6. Prostatomegaly. 7. Coronary artery disease. Aortic Atherosclerosis (ICD10-I70.0). Electronically Signed   By: Delanna Ahmadi M.D.   On: 10/07/2021 11:14   DG Chest Port 1 View  Result Date: 10/12/2021 CLINICAL DATA:  70 year old male status post thoracentesis EXAM: PORTABLE CHEST 1 VIEW COMPARISON:  Chest x-ray 10/11/2021 FINDINGS: No evidence of pneumothorax. Decreased right-sided pleural effusion. Right IJ single-lumen power injectable port catheter remains in unchanged position. No acute osseous abnormality. IMPRESSION: No evidence of pneumothorax or other complication following right thoracentesis. Electronically Signed   By: Jacqulynn Cadet M.D.   On: 10/12/2021 13:06   DG Chest Port 1 View  Result Date: 10/11/2021 CLINICAL DATA:  Dyspnea, wheezing EXAM: PORTABLE CHEST 1 VIEW COMPARISON:  Chest radiograph 09/17/2021, CT chest 10/04/2021 FINDINGS: A right chest wall port is in stable position. The cardiomediastinal silhouette is grossly stable. There is unchanged asymmetric elevation of the right hemidiaphragm. Confluent opacity in the medial right lung with associated architectural distortion and volume loss is similar to the prior chest radiograph and CT. The left lung appears clear. A small amount of pleural fluid is again suspected in the right apex, but the right costophrenic angle appears sharp. There is no left pleural effusion. There is no pneumothorax. IMPRESSION: Unchanged right hilar opacity with associated architectural distortion. No evidence of new/acute pulmonary process. Electronically Signed   By: Valetta Mole M.D.   On: 10/11/2021 11:04   ECHOCARDIOGRAM COMPLETE  Result Date: 10/12/2021    ECHOCARDIOGRAM REPORT   Patient Name:   [facility] [place] Date of Exam: 10/12/2021 Medical Rec #:  [id number]    Height:       69.0 in Accession #:     [id number]   Weight:       291.0 lb Date of Birth:  Dec 29, 1950    BSA:          2.423 m Patient Age:    50 years     BP:           132/84 mmHg Patient Gender: M            HR:           85 bpm. Exam Location:  Inpatient Procedure: 2D Echo, Color Doppler and Cardiac Doppler Indications:    CHF  History:        Patient has prior history of Echocardiogram examinations. Risk                 Factors:Diabetes, Sleep Apnea and Hypertension.  Sonographer:    Jyl Heinz Referring Phys: [id number] [place]  1. Left ventricular ejection fraction, by estimation, is 50 to 55%. The left ventricle has low normal function. The left ventricle has no regional wall motion abnormalities. There is mild left ventricular hypertrophy. Left ventricular diastolic parameters are indeterminate.  2. Right ventricular systolic function is normal. The right ventricular size is normal.  3. Left atrial size was mildly dilated.  4. The mitral valve is normal in structure. No evidence of mitral valve regurgitation. No evidence of mitral stenosis.  5. The aortic valve is tricuspid. Aortic valve regurgitation is not visualized. No aortic stenosis is present.  6. Aortic dilatation noted. There is borderline dilatation of the aortic root, measuring 38 mm. There is borderline dilatation of the ascending aorta, measuring 38 mm.  7. The inferior vena cava is normal in size with greater than 50% respiratory variability, suggesting right atrial pressure of 3 mmHg. FINDINGS  Left Ventricle: Left ventricular ejection fraction, by estimation, is 50 to 55%. The left ventricle has low normal function. The left ventricle has no regional wall motion abnormalities. The left ventricular internal cavity size was normal in size. There is mild left ventricular hypertrophy. Left ventricular diastolic parameters are indeterminate. Right Ventricle: The right ventricular size is normal. Right ventricular systolic function is normal. Left Atrium: Left atrial  size was mildly dilated. Right Atrium: Right atrial size was normal in size. Pericardium: There is no evidence of pericardial effusion. Mitral Valve: The mitral valve is normal in structure. No evidence of mitral valve regurgitation. No evidence of mitral valve stenosis. Tricuspid Valve: The tricuspid valve is normal in structure. Tricuspid valve regurgitation is trivial. No evidence of tricuspid stenosis. Aortic Valve: The aortic valve is tricuspid. Aortic valve regurgitation is not visualized. No aortic stenosis is present. Aortic valve peak gradient measures 8.4 mmHg. Pulmonic Valve: The pulmonic valve was not well visualized. Pulmonic valve regurgitation is not visualized. No evidence of pulmonic stenosis. Aorta: Aortic dilatation noted. There is borderline dilatation of the aortic root, measuring 38 mm. There is borderline dilatation of the ascending aorta, measuring 38 mm. Venous: The inferior vena cava is normal in size with greater than 50% respiratory variability, suggesting right atrial pressure of 3 mmHg. IAS/Shunts: No atrial level shunt detected by color flow Doppler.  LEFT VENTRICLE PLAX 2D LVIDd:         4.90 cm      Diastology LVIDs:         3.50 cm      LV e' medial:    14.00 cm/s LV PW:         1.20 cm      LV E/e' medial:  10.0 LV IVS:        1.30 cm      LV e' lateral:   18.50 cm/s LVOT diam:     2.00 cm      LV E/e' lateral: 7.6 LV SV:         81 LV SV Index:   34 LVOT Area:     3.14 cm  LV Volumes (MOD) LV vol d, MOD A2C: 143.0 ml LV vol d, MOD A4C: 155.0 ml LV vol s, MOD A2C: 69.7 ml LV vol s, MOD A4C: 73.1 ml LV SV MOD A2C:     73.3 ml LV SV MOD A4C:     155.0 ml LV SV MOD BP:      78.5 ml RIGHT VENTRICLE            IVC RV Basal diam:  3.60 cm    IVC diam: 2.20 cm RV Mid diam:    3.30 cm RV S prime:     9.08 cm/s TAPSE (M-mode): 1.6 cm LEFT ATRIUM             Index        RIGHT ATRIUM  Index LA diam:        3.90 cm 1.61 cm/m   RA Area:     20.80 cm LA Vol (A2C):   85.1 ml 35.12  ml/m  RA Volume:   61.80 ml  25.51 ml/m LA Vol (A4C):   67.7 ml 27.94 ml/m LA Biplane Vol: 81.8 ml 33.76 ml/m  AORTIC VALVE AV Area (Vmax): 2.66 cm AV Vmax:        145.00 cm/s AV Peak Grad:   8.4 mmHg LVOT Vmax:      123.00 cm/s LVOT Vmean:     88.850 cm/s LVOT VTI:       0.259 m  AORTA Ao Root diam: 3.80 cm Ao Asc diam:  3.80 cm MITRAL VALVE MV Area (PHT): 5.16 cm     SHUNTS MV Decel Time: 147 msec     Systemic VTI:  0.26 m MV E velocity: 140.00 cm/s  Systemic Diam: 2.00 cm Kirk Ruths MD Electronically signed by Kirk Ruths MD Signature Date/Time: 10/12/2021/11:27:54 AM    Final    US THORACENTESIS ASP PLEURAL SPACE W/IMG GUIDE  Result Date: 10/12/2021 INDICATION: 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 Request for IR to perform diagnostic and therapeutic thoracentesis for right-sided pleural effusion. EXAM: ULTRASOUND GUIDED diagnostic and therapeutic THORACENTESIS MEDICATIONS: 105ml 1% lidocaine COMPLICATIONS: None immediate. PROCEDURE: An ultrasound guided thoracentesis was thoroughly discussed with the patient and questions answered. The benefits, risks, alternatives and complications were also discussed. The patient understands and wishes to proceed with the procedure. Written consent was obtained. Ultrasound was performed to localize and mark an adequate pocket of fluid in the right chest. The area was then prepped and draped in the normal sterile fashion. 1% Lidocaine was used for local anesthesia. Under ultrasound guidance a 6 Fr Safe-T-Centesis catheter was introduced. Thoracentesis was performed. The catheter was removed and a dressing applied. FINDINGS: A total of approximately 1.2 L of hazy, amber fluid was removed. Samples were sent to the laboratory as requested by the clinical team. IMPRESSION: Successful ultrasound guided right thoracentesis yielding 1.2 L of pleural fluid. Read by: Narda Rutherford, NP Electronically Signed   By: Albin Felling M.D.    On: 10/12/2021 12:27       Subjective: No issues this morning. Breathing well.  Discharge Exam: Vitals:   10/13/21 0643 10/13/21 0756  BP: 129/89   Pulse:  97  Resp:    Temp: 98 F (36.7 C)   SpO2: 97%    Vitals:   10/12/21 2024 10/12/21 2047 10/13/21 0643 10/13/21 0756  BP:  123/73 129/89   Pulse:  (!) 109  97  Resp:  20    Temp:  97.9 F (36.6 C) 98 F (36.7 C)   TempSrc:  Oral Oral   SpO2: 91% 90% 97%   Weight:      Height:        General: Pt is alert, awake, not in acute distress Cardiovascular: RRR, S1/S2 +, no rubs, no gallops Respiratory: CTA bilaterally, no wheezing, no rhonchi Abdominal: Soft, NT, protuberant, bowel sounds + Extremities: no edema, no cyanosis    The results of significant diagnostics from this hospitalization (including imaging, microbiology, ancillary and laboratory) are listed below for reference.     Microbiology: Recent Results (from the past 240 hour(s))  Culture, blood (routine x 2)     Status: Abnormal (Preliminary result)   Collection Time: 10/11/21 10:48 AM   Specimen: BLOOD  Result Value Ref Range  Status   Specimen Description   Final    BLOOD LEFT ANTECUBITAL Performed at [facility] [address], [place], [place] [address]    Special Requests   Final    BOTTLES DRAWN AEROBIC AND ANAEROBIC Blood Culture adequate volume Performed at [facility] [address], [place], [place] [address]    Culture  Setup Time   Final    GRAM POSITIVE COCCI IN BOTH AEROBIC AND ANAEROBIC BOTTLES CRITICAL RESULT CALLED TO, READ BACK BY AND VERIFIED WITH: PHARMD M.BELL AT 2482 ON 10/12/2021 BY T.SAAD.    Culture (A)  Final    STAPHYLOCOCCUS HAEMOLYTICUS THE SIGNIFICANCE OF ISOLATING THIS ORGANISM FROM A SINGLE SET OF BLOOD CULTURES WHEN MULTIPLE SETS ARE DRAWN IS UNCERTAIN. PLEASE NOTIFY THE MICROBIOLOGY DEPARTMENT WITHIN ONE WEEK IF SPECIATION AND SENSITIVITIES ARE REQUIRED. Performed at [facility], [place] [address], [place], [place] [address]    Report Status PENDING  Incomplete  Blood Culture ID Panel (Reflexed)     Status: Abnormal   Collection Time: 10/11/21 10:48 AM  Result Value Ref Range Status   Enterococcus faecalis NOT DETECTED NOT DETECTED Final   Enterococcus Faecium NOT DETECTED NOT DETECTED Final   Listeria monocytogenes NOT DETECTED NOT DETECTED Final   Staphylococcus species DETECTED (A) NOT DETECTED Final    Comment: CRITICAL RESULT CALLED TO, READ BACK BY AND VERIFIED WITH: PHARMD M.BELL AT 0488 ON 10/12/2021 BY T.SAAD.    Staphylococcus aureus (BCID) NOT DETECTED NOT DETECTED Final   Staphylococcus epidermidis NOT DETECTED NOT DETECTED Final   Staphylococcus lugdunensis NOT DETECTED NOT DETECTED Final   Streptococcus species NOT DETECTED NOT DETECTED Final   Streptococcus agalactiae NOT DETECTED NOT DETECTED Final   Streptococcus pneumoniae NOT DETECTED NOT DETECTED Final   Streptococcus pyogenes NOT DETECTED NOT DETECTED Final   A.calcoaceticus-baumannii NOT DETECTED NOT DETECTED Final   Bacteroides fragilis NOT DETECTED NOT DETECTED Final   Enterobacterales NOT DETECTED NOT DETECTED Final   Enterobacter cloacae complex NOT DETECTED NOT DETECTED Final   Escherichia coli NOT DETECTED NOT DETECTED Final   Klebsiella aerogenes NOT DETECTED NOT DETECTED Final   Klebsiella oxytoca NOT DETECTED NOT DETECTED Final   Klebsiella pneumoniae NOT DETECTED NOT DETECTED Final   Proteus species NOT DETECTED NOT DETECTED Final   Salmonella species NOT DETECTED NOT DETECTED Final   Serratia marcescens NOT DETECTED NOT DETECTED Final   Haemophilus influenzae NOT DETECTED NOT DETECTED Final   Neisseria meningitidis NOT DETECTED NOT DETECTED Final   Pseudomonas aeruginosa NOT DETECTED NOT DETECTED Final   Stenotrophomonas maltophilia NOT DETECTED NOT DETECTED Final   Candida albicans NOT DETECTED NOT DETECTED Final   Candida auris NOT DETECTED NOT DETECTED Final    Candida glabrata NOT DETECTED NOT DETECTED Final   Candida krusei NOT DETECTED NOT DETECTED Final   Candida parapsilosis NOT DETECTED NOT DETECTED Final   Candida tropicalis NOT DETECTED NOT DETECTED Final   Cryptococcus neoformans/gattii NOT DETECTED NOT DETECTED Final    Comment: Performed at [facility], [address], [place], [place] [address]  Resp Panel by RT-PCR (Flu A&B, Covid) Nasopharyngeal Swab     Status: None   Collection Time: 10/11/21 10:50 AM   Specimen: Nasopharyngeal Swab; Nasopharyngeal(NP) swabs in vial transport medium  Result Value Ref Range Status   SARS Coronavirus 2 by RT PCR NEGATIVE NEGATIVE Final    Comment: (NOTE) SARS-CoV-2 target nucleic acids are NOT DETECTED.  The SARS-CoV-2 RNA is generally  detectable in upper respiratory specimens during the acute phase of infection. The lowest concentration of SARS-CoV-2 viral copies this assay can detect is 138 copies/mL. A negative result does not preclude SARS-Cov-2 infection and should not be used as the sole basis for treatment or other patient management decisions. A negative result may occur with  improper specimen collection/handling, submission of specimen other than nasopharyngeal swab, presence of viral mutation(s) within the areas targeted by this assay, and inadequate number of viral copies(<138 copies/mL). A negative result must be combined with clinical observations, patient history, and epidemiological information. The expected result is Negative.  Fact Sheet for Patients:  EntrepreneurPulse.com.au  Fact Sheet for Healthcare Providers:  IncredibleEmployment.be  This test is no t yet approved or cleared by the [place] FDA and  has been authorized for detection and/or diagnosis of SARS-CoV-2 by FDA under an Emergency Use Authorization (EUA). This EUA will remain  in effect (meaning this test can be used) for the duration of the COVID-19  declaration under Section 564(b)(1) of the Act, 21 U.S.C.section 360bbb-3(b)(1), unless the authorization is terminated  or revoked sooner.       Influenza A by PCR NEGATIVE NEGATIVE Final   Influenza B by PCR NEGATIVE NEGATIVE Final    Comment: (NOTE) The Xpert Xpress SARS-CoV-2/FLU/RSV plus assay is intended as an aid in the diagnosis of influenza from Nasopharyngeal swab specimens and should not be used as a sole basis for treatment. Nasal washings and aspirates are unacceptable for Xpert Xpress SARS-CoV-2/FLU/RSV testing.  Fact Sheet for Patients: EntrepreneurPulse.com.au  Fact Sheet for Healthcare Providers: IncredibleEmployment.be  This test is not yet approved or cleared by the [place] FDA and has been authorized for detection and/or diagnosis of SARS-CoV-2 by FDA under an Emergency Use Authorization (EUA). This EUA will remain in effect (meaning this test can be used) for the duration of the COVID-19 declaration under Section 564(b)(1) of the Act, 21 U.S.C. section 360bbb-3(b)(1), unless the authorization is terminated or revoked.  Performed at [facility], [place] [address], [place], [place] [address]   Culture, blood (routine x 2)     Status: None (Preliminary result)   Collection Time: 10/11/21 10:53 AM   Specimen: BLOOD  Result Value Ref Range Status   Specimen Description   Final    BLOOD RIGHT ANTECUBITAL Performed at [facility] [address], [place], [place] [address]    Special Requests   Final    BOTTLES DRAWN AEROBIC AND ANAEROBIC Blood Culture adequate volume Performed at [facility] [address], [place], [place] [address]    Culture   Final    NO GROWTH 2 DAYS Performed at  [address] [place], [place] [address]    Report Status PENDING  Incomplete  Gram stain     Status: None   Collection Time: 10/12/21 12:20 PM    Specimen: PATH Cytology Pleural fluid  Result Value Ref Range Status   Specimen Description PLEURAL RIGHT SIDE  Final   Special Requests NONE  Final   Gram Stain   Final    WBC PRESENT,BOTH PMN AND MONONUCLEAR NO ORGANISMS SEEN CYTOSPIN SMEAR Gram Stain Report Called to,Read Back By and Verified With: MAYS,J RN ON 10/12/2021 at 1715 by Dorien Chihuahua Performed at [facility], [place] [address], [place], [place] [address]    Report Status 10/12/2021 FINAL  Final  MRSA Next Gen by PCR, Nasal     Status: None  Collection Time: 10/12/21  6:04 PM   Specimen: Nasal Mucosa  Result Value Ref Range Status   MRSA by PCR Next Gen NOT DETECTED NOT DETECTED Final    Comment: (NOTE) The GeneXpert MRSA Assay (FDA approved for NASAL specimens only), is one component of a comprehensive MRSA colonization surveillance program. It is not intended to diagnose MRSA infection nor to guide or monitor treatment for MRSA infections. Test performance is not FDA approved in patients less than 58 years old. Performed at [facility], [place] [address], [place], [place] [address]      Labs: BNP (last 3 results) Recent Labs    10/11/21 1047  BNP 604.5*   Basic Metabolic Panel: Recent Labs  Lab 10/08/21 0927 10/11/21 1047 10/12/21 0615 10/13/21 0904  NA 139 139 134* 136  K 3.9 4.0 5.0 4.8  CL 105 103 103 100  CO2 $Re'25 28 25 28  'fbx$ GLUCOSE 183* 197* 222* 165*  BUN 11 26* 34* 32*  CREATININE 1.21 1.30* 1.26* 1.28*  CALCIUM 9.0 8.0* 8.1* 8.7*   Liver Function Tests: Recent Labs  Lab 10/08/21 0927 10/11/21 1047 10/12/21 0615  AST 21 57* 31  ALT 19 69* 50*  ALKPHOS 61 45 37*  BILITOT 0.4 0.9 0.6  PROT 6.2* 6.6 5.9*  ALBUMIN 3.6 3.9 3.6   No results for input(s): LIPASE, AMYLASE in the last 168 hours. No results for input(s): AMMONIA in the last 168 hours. CBC: Recent Labs  Lab 10/08/21 0927 10/11/21 1047 10/12/21 0615  WBC 3.9* 7.0 5.4  NEUTROABS 2.6 6.2  --    HGB 11.9* 11.6* 10.2*  HCT 36.0* 36.3* 31.7*  MCV 90.5 94.3 94.1  PLT 245 243 166   Cardiac Enzymes: No results for input(s): CKTOTAL, CKMB, CKMBINDEX, TROPONINI in the last 168 hours. BNP: Invalid input(s): POCBNP CBG: Recent Labs  Lab 10/12/21 1233 10/12/21 1740 10/12/21 2141 10/13/21 0745 10/13/21 1126  GLUCAP 246* 282* 200* 146* 201*   D-Dimer Recent Labs    10/11/21 1047  DDIMER 1.63*   Hgb A1c No results for input(s): HGBA1C in the last 72 hours. Lipid Profile No results for input(s): CHOL, HDL, LDLCALC, TRIG, CHOLHDL, LDLDIRECT in the last 72 hours. Thyroid function studies No results for input(s): TSH, T4TOTAL, T3FREE, THYROIDAB in the last 72 hours.  Invalid input(s): FREET3 Anemia work up No results for input(s): VITAMINB12, FOLATE, FERRITIN, TIBC, IRON, RETICCTPCT in the last 72 hours. Urinalysis    Component Value Date/Time   COLORURINE YELLOW 10/11/2021 2340   APPEARANCEUR CLEAR 10/11/2021 2340   LABSPEC 1.032 (H) 10/11/2021 2340   PHURINE 5.0 10/11/2021 2340   GLUCOSEU >=500 (A) 10/11/2021 2340   GLUCOSEU 100 (A) 11/25/2017 1359   HGBUR NEGATIVE 10/11/2021 2340   BILIRUBINUR NEGATIVE 10/11/2021 2340   KETONESUR NEGATIVE 10/11/2021 2340   PROTEINUR NEGATIVE 10/11/2021 2340   UROBILINOGEN 0.2 11/25/2017 1359   NITRITE NEGATIVE 10/11/2021 2340   LEUKOCYTESUR NEGATIVE 10/11/2021 2340   Sepsis Labs Invalid input(s): PROCALCITONIN,  WBC,  LACTICIDVEN Microbiology Recent Results (from the past 240 hour(s))  Culture, blood (routine x 2)     Status: Abnormal (Preliminary result)   Collection Time: 10/11/21 10:48 AM   Specimen: BLOOD  Result Value Ref Range Status   Specimen Description   Final    BLOOD LEFT ANTECUBITAL Performed at [facility], [place] [address] [place], [place] [address]    Special Requests   Final    BOTTLES DRAWN AEROBIC AND ANAEROBIC Blood  Culture adequate volume Performed at [facility] [address], [place], [place] [address]    Culture  Setup Time   Final    GRAM POSITIVE COCCI IN BOTH AEROBIC AND ANAEROBIC BOTTLES CRITICAL RESULT CALLED TO, READ BACK BY AND VERIFIED WITH: PHARMD M.BELL AT 6045 ON 10/12/2021 BY T.SAAD.    Culture (A)  Final    STAPHYLOCOCCUS HAEMOLYTICUS THE SIGNIFICANCE OF ISOLATING THIS ORGANISM FROM A SINGLE SET OF BLOOD CULTURES WHEN MULTIPLE SETS ARE DRAWN IS UNCERTAIN. PLEASE NOTIFY THE MICROBIOLOGY DEPARTMENT WITHIN ONE WEEK IF SPECIATION AND SENSITIVITIES ARE REQUIRED. Performed at [facility], [place] [address], [place], [place] [address]    Report Status PENDING  Incomplete  Blood Culture ID Panel (Reflexed)     Status: Abnormal   Collection Time: 10/11/21 10:48 AM  Result Value Ref Range Status   Enterococcus faecalis NOT DETECTED NOT DETECTED Final   Enterococcus Faecium NOT DETECTED NOT DETECTED Final   Listeria monocytogenes NOT DETECTED NOT DETECTED Final   Staphylococcus species DETECTED (A) NOT DETECTED Final    Comment: CRITICAL RESULT CALLED TO, READ BACK BY AND VERIFIED WITH: PHARMD M.BELL AT 1914 ON 10/12/2021 BY T.SAAD.    Staphylococcus aureus (BCID) NOT DETECTED NOT DETECTED Final   Staphylococcus epidermidis NOT DETECTED NOT DETECTED Final   Staphylococcus lugdunensis NOT DETECTED NOT DETECTED Final   Streptococcus species NOT DETECTED NOT DETECTED Final   Streptococcus agalactiae NOT DETECTED NOT DETECTED Final   Streptococcus pneumoniae NOT DETECTED NOT DETECTED Final   Streptococcus pyogenes NOT DETECTED NOT DETECTED Final   A.calcoaceticus-baumannii NOT DETECTED NOT DETECTED Final   Bacteroides fragilis NOT DETECTED NOT DETECTED Final   Enterobacterales NOT DETECTED NOT DETECTED Final   Enterobacter cloacae complex NOT DETECTED NOT DETECTED Final   Escherichia coli NOT DETECTED NOT DETECTED Final   Klebsiella aerogenes NOT DETECTED NOT DETECTED Final   Klebsiella oxytoca NOT DETECTED NOT DETECTED  Final   Klebsiella pneumoniae NOT DETECTED NOT DETECTED Final   Proteus species NOT DETECTED NOT DETECTED Final   Salmonella species NOT DETECTED NOT DETECTED Final   Serratia marcescens NOT DETECTED NOT DETECTED Final   Haemophilus influenzae NOT DETECTED NOT DETECTED Final   Neisseria meningitidis NOT DETECTED NOT DETECTED Final   Pseudomonas aeruginosa NOT DETECTED NOT DETECTED Final   Stenotrophomonas maltophilia NOT DETECTED NOT DETECTED Final   Candida albicans NOT DETECTED NOT DETECTED Final   Candida auris NOT DETECTED NOT DETECTED Final   Candida glabrata NOT DETECTED NOT DETECTED Final   Candida krusei NOT DETECTED NOT DETECTED Final   Candida parapsilosis NOT DETECTED NOT DETECTED Final   Candida tropicalis NOT DETECTED NOT DETECTED Final   Cryptococcus neoformans/gattii NOT DETECTED NOT DETECTED Final    Comment: Performed at [facility], [address], [place], [place] [address]  Resp Panel by RT-PCR (Flu A&B, Covid) Nasopharyngeal Swab     Status: None   Collection Time: 10/11/21 10:50 AM   Specimen: Nasopharyngeal Swab; Nasopharyngeal(NP) swabs in vial transport medium  Result Value Ref Range Status   SARS Coronavirus 2 by RT PCR NEGATIVE NEGATIVE Final    Comment: (NOTE) SARS-CoV-2 target nucleic acids are NOT DETECTED.  The SARS-CoV-2 RNA is generally detectable in upper respiratory specimens during the acute phase of infection. The lowest concentration of SARS-CoV-2 viral copies this assay can detect is 138 copies/mL. A negative result does not preclude SARS-Cov-2 infection and should not be used as the sole basis for treatment  or other patient management decisions. A negative result may occur with  improper specimen collection/handling, submission of specimen other than nasopharyngeal swab, presence of viral mutation(s) within the areas targeted by this assay, and inadequate number of viral copies(<138 copies/mL). A negative result must be combined  with clinical observations, patient history, and epidemiological information. The expected result is Negative.  Fact Sheet for Patients:  EntrepreneurPulse.com.au  Fact Sheet for Healthcare Providers:  IncredibleEmployment.be  This test is no t yet approved or cleared by the [place] FDA and  has been authorized for detection and/or diagnosis of SARS-CoV-2 by FDA under an Emergency Use Authorization (EUA). This EUA will remain  in effect (meaning this test can be used) for the duration of the COVID-19 declaration under Section 564(b)(1) of the Act, 21 U.S.C.section 360bbb-3(b)(1), unless the authorization is terminated  or revoked sooner.       Influenza A by PCR NEGATIVE NEGATIVE Final   Influenza B by PCR NEGATIVE NEGATIVE Final    Comment: (NOTE) The Xpert Xpress SARS-CoV-2/FLU/RSV plus assay is intended as an aid in the diagnosis of influenza from Nasopharyngeal swab specimens and should not be used as a sole basis for treatment. Nasal washings and aspirates are unacceptable for Xpert Xpress SARS-CoV-2/FLU/RSV testing.  Fact Sheet for Patients: EntrepreneurPulse.com.au  Fact Sheet for Healthcare Providers: IncredibleEmployment.be  This test is not yet approved or cleared by the [place] FDA and has been authorized for detection and/or diagnosis of SARS-CoV-2 by FDA under an Emergency Use Authorization (EUA). This EUA will remain in effect (meaning this test can be used) for the duration of the COVID-19 declaration under Section 564(b)(1) of the Act, 21 U.S.C. section 360bbb-3(b)(1), unless the authorization is terminated or revoked.  Performed at [facility], [place] [address] [place], [place] [address]   Culture, blood (routine x 2)     Status: None (Preliminary result)   Collection Time: 10/11/21 10:53 AM   Specimen: BLOOD  Result Value Ref Range Status   Specimen  Description   Final    BLOOD RIGHT ANTECUBITAL Performed at [facility] [address] [place], [place] [address]    Special Requests   Final    BOTTLES DRAWN AEROBIC AND ANAEROBIC Blood Culture adequate volume Performed at [facility] [address] [place], [place] [address]    Culture   Final    NO GROWTH 2 DAYS Performed at [facility] [address], [place], [place] [address]    Report Status PENDING  Incomplete  Gram stain     Status: None   Collection Time: 10/12/21 12:20 PM   Specimen: PATH Cytology Pleural fluid  Result Value Ref Range Status   Specimen Description PLEURAL RIGHT SIDE  Final   Special Requests NONE  Final   Gram Stain   Final    WBC PRESENT,BOTH PMN AND MONONUCLEAR NO ORGANISMS SEEN CYTOSPIN SMEAR Gram Stain Report Called to,Read Back By and Verified With: MAYS,J RN ON 10/12/2021 at 1715 by Dorien Chihuahua Performed at [facility], [place] [address], [place],  [address]    Report Status 10/12/2021 FINAL  Final  MRSA Next Gen by PCR, Nasal     Status: None   Collection Time: 10/12/21  6:04 PM   Specimen: Nasal Mucosa  Result Value Ref Range Status   MRSA by PCR Next Gen NOT DETECTED NOT DETECTED Final    Comment: (NOTE) The GeneXpert MRSA Assay (FDA approved for NASAL  specimens only), is one component of a comprehensive MRSA colonization surveillance program. It is not intended to diagnose MRSA infection nor to guide or monitor treatment for MRSA infections. Test performance is not FDA approved in patients less than 73 years old. Performed at [facility], [place] [address], [place], [place] [address]      Time coordinating discharge: 35 minutes  SIGNED:   Cordelia Poche, MD [facility] 10/13/2021, 11:58 AM

## 2021-10-13 NOTE — TOC Transition Note (Signed)
Transition of Care [facility]) - CM/SW Discharge Note   Patient Details  Name: Adam Wise MRN: [id number] Date of Birth: [birth year]  Transition of [facility] CM/SW Contact:  Dessa Phi, RN Phone Number: 10/13/2021, 12:33 PM   Clinical Narrative:  spoke to spouse about d/c recc-no preference for nebullizer machine dme company-[employer] rep Jazmine to deliver to rm prior d/c. No further CM needs.     Final next level of care: Home/Self Care Barriers to Discharge: No Barriers Identified   Patient Goals and CMS Choice        Discharge Placement                       Discharge Plan and Services                DME Arranged: Nebulizer machine DME Agency: [employer] Date DME Agency Contacted: 10/13/21 Time DME Agency Contacted: 16 Representative spoke with at DME Agency: [place] ([place]) Interventions     Readmission Risk Interventions No flowsheet data found.

## 2021-10-13 NOTE — Plan of Care (Signed)

## 2021-10-14 ENCOUNTER — Telehealth: Payer: Self-pay

## 2021-10-14 LAB — CULTURE, BLOOD (ROUTINE X 2): Special Requests: ADEQUATE

## 2021-10-14 LAB — LEGIONELLA PNEUMOPHILA SEROGP 1 UR AG: L. pneumophila Serogp 1 Ur Ag: NEGATIVE

## 2021-10-14 NOTE — Telephone Encounter (Signed)
Transition Care Management Follow-up Telephone Call Date of discharge and from where: 10/13/21 from [facility] How have you been since you were released from the hospital? Patient states feeling much better. Patient states still having some shortness of breath. Any questions or concerns? No  Items Reviewed: Did the pt receive and understand the discharge instructions provided? Yes  Medications obtained and verified? Yes  Other? No  Any new allergies since your discharge? No  Dietary orders reviewed? Yes Do you have support at home? Yes   Home Care and Equipment/Supplies: Were home health services ordered? no If so, what is the name of the agency? N/A  Has the agency set up a time to come to the patient's home? not applicable Were any new equipment or medical supplies ordered?  Yes: , nebulizer What is the name of the medical supply agency? [facility] Were you able to get the supplies/equipment? yes Do you have any questions related to the use of the equipment or supplies? No  Functional Questionnaire: (I = Independent and D = Dependent) ADLs: I  Bathing/Dressing- I  Meal Prep- I  Eating- I  Maintaining continence- I  Transferring/Ambulation- I  Managing Meds- I  Follow up appointments reviewed:  PCP Hospital f/u appt confirmed? Yes  Scheduled to see K. Clark NP on 10/18/21 @ 2:40pm. [place] [facility] f/u appt confirmed? No , not indicated on discharge instructions Are transportation arrangements needed? No  If their condition worsens, is the pt aware to call PCP or go to the Emergency Dept.? Yes Was the patient provided with contact information for the PCP's office or ED? Yes Was to pt encouraged to call back with questions or concerns? Yes

## 2021-10-15 LAB — CYTOLOGY - NON PAP

## 2021-10-15 NOTE — Telephone Encounter (Signed)
Noted  

## 2021-10-16 LAB — CULTURE, BLOOD (ROUTINE X 2)
Culture: NO GROWTH
Special Requests: ADEQUATE

## 2021-10-18 ENCOUNTER — Encounter: Payer: Self-pay | Admitting: Primary Care

## 2021-10-18 ENCOUNTER — Other Ambulatory Visit: Payer: Self-pay

## 2021-10-18 ENCOUNTER — Inpatient Hospital Stay: Payer: Medicare Other | Admitting: Primary Care

## 2021-10-18 ENCOUNTER — Ambulatory Visit (INDEPENDENT_AMBULATORY_CARE_PROVIDER_SITE_OTHER): Payer: Medicare Other | Admitting: Primary Care

## 2021-10-18 VITALS — BP 140/82 | HR 107 | Temp 97.6°F | Ht 69.0 in | Wt 282.0 lb

## 2021-10-18 DIAGNOSIS — R0609 Other forms of dyspnea: Secondary | ICD-10-CM

## 2021-10-18 DIAGNOSIS — J90 Pleural effusion, not elsewhere classified: Secondary | ICD-10-CM | POA: Diagnosis not present

## 2021-10-18 DIAGNOSIS — I10 Essential (primary) hypertension: Secondary | ICD-10-CM

## 2021-10-18 DIAGNOSIS — Z794 Long term (current) use of insulin: Secondary | ICD-10-CM

## 2021-10-18 DIAGNOSIS — E1165 Type 2 diabetes mellitus with hyperglycemia: Secondary | ICD-10-CM | POA: Diagnosis not present

## 2021-10-18 LAB — CBC
HCT: 37.7 % — ABNORMAL LOW (ref 38.5–50.0)
Hemoglobin: 12.6 g/dL — ABNORMAL LOW (ref 13.2–17.1)
MCH: 30.4 pg (ref 27.0–33.0)
MCHC: 33.4 g/dL (ref 32.0–36.0)
MCV: 90.8 fL (ref 80.0–100.0)
MPV: 11.4 fL (ref 7.5–12.5)
Platelets: 140 10*3/uL (ref 140–400)
RBC: 4.15 10*6/uL — ABNORMAL LOW (ref 4.20–5.80)
RDW: 15.6 % — ABNORMAL HIGH (ref 11.0–15.0)
WBC: 2.5 10*3/uL — ABNORMAL LOW (ref 3.8–10.8)

## 2021-10-18 LAB — BASIC METABOLIC PANEL
BUN/Creatinine Ratio: 16 (calc) (ref 6–22)
BUN: 23 mg/dL (ref 7–25)
CO2: 27 mmol/L (ref 20–32)
Calcium: 9 mg/dL (ref 8.6–10.3)
Chloride: 103 mmol/L (ref 98–110)
Creat: 1.45 mg/dL — ABNORMAL HIGH (ref 0.70–1.28)
Glucose, Bld: 217 mg/dL — ABNORMAL HIGH (ref 65–99)
Potassium: 4.7 mmol/L (ref 3.5–5.3)
Sodium: 139 mmol/L (ref 135–146)

## 2021-10-18 MED ORDER — LOSARTAN POTASSIUM 50 MG PO TABS: 50.0000 mg | ORAL_TABLET | Freq: Every day | ORAL | 3 refills | Status: DC

## 2021-10-18 NOTE — Assessment & Plan Note (Signed)
Uncontrolled today, also on prior visits.  Increase losartan to 50 mg. He will monitor blood pressure and update if no reduction Continue amlodipine 10 mg.

## 2021-10-18 NOTE — Patient Instructions (Addendum)
Stop by the lab prior to leaving today. I will notify you of your results once received.   Move your Basaglar to the morning. Continue 20 units daily.  We increased your losartan to 50 mg. I sent a new prescription to your pharmacy.  Call the cardiologist to set up an appointment. [phone]. Tell them I referred you.  It was a pleasure to see you today!

## 2021-10-18 NOTE — Assessment & Plan Note (Signed)
Recent hospitalization requiring thoracentesis.  Today he is improved, but does appear to be struggling with exertional dyspnea.  Exam today overall stable. Continue nebulizer treatments. Repeat BMP/CBC pending.  Cytology labs reviewed with patient.  He will follow-up with oncology in a few weeks. Strict return/ED precautions provided.  Hospital notes, labs, imaging reviewed.

## 2021-10-18 NOTE — Assessment & Plan Note (Signed)
Likely secondary to right pleural effusion, but given evidence of CAD and other comorbidities will need to also rule out cardiac contribution.  He was referred to cardiology, he has their phone number and will call them for an appointment.

## 2021-10-18 NOTE — Progress Notes (Signed)
Subjective:    Patient ID: Adam Wise, male    DOB: 1951-09-18, 70 y.o.   MRN: [id number]  HPI  Adam Wise is a very pleasant 70 y.o. male with a history of type 2 diabetes, hypertension, OSA, COPD, small cell lung cancer who presents today for hospital follow up.  Admitted to [facility] for acute respiratory failure, COPD exacerbation, possible pneumonia, and right pleural effusion after he presented to [facility] via EMS with progressing, persistent dyspnea with cough for several days.   During his hospital stay he underwent thoracentesis with removal of 1.2 liters of fluid. He was treated with IV ceftriaxone for suspected CAP. He required 3 liters of oxygen per [place], but symptoms improved post thoracentesis and oxygen was no longer required. He was discharged home on 10/13/21 with recommendations for repeat CBC/BMP, PCP follow up, Rx's for azithromycin and ceftriaxone.   Today he is feeling better but continues to notice wheezing and exertional dyspnea with minimal activity. He's using nebulizer treatments twice daily which have helped. He is compliant to his CPAP machine nightly, uses an oxygen concentrator. He completed the azithromycin and ceftriaxone antibiotics.   He is checking glucose levels which are running 80's-low 100's in the morning, and 200-300's in the evening. He is still undergoing chemotherapy with steroid treatments. He is injecting 20 units of Basaglar regularly now, has been doing so for the last 2 weeks. He is compliant to metformin 1000 mg BID and Glipizide 10 mg BID.   He is compliant to losartan 25 mg daily and amlodipine 10 mg.  He has an appointment scheduled with his oncologist in few weeks.   BP Readings from Last 3 Encounters:  10/18/21 140/82  10/13/21 (!) 144/86  10/08/21 (!) 142/97     Review of Systems  Constitutional:  Negative for fever.  Respiratory:  Positive for cough, shortness of breath and wheezing.   Neurological:  Negative for  dizziness.        Past Medical History:  Diagnosis Date   Cancer (HCC)    Chickenpox    Chronic knee pain    Chronic low back pain    Essential hypertension    GERD (gastroesophageal reflux disease)    OSA (obstructive sleep apnea)    Testosterone deficiency    Type 2 diabetes mellitus (HCC)     Social History   Socioeconomic History   Marital status: Married    Spouse name: Not on file   Number of children: Not on file   Years of education: Not on file   Highest education level: Not on file  Occupational History   Not on file  Tobacco Use   Smoking status: Former   Smokeless tobacco: Never  Vaping Use   Vaping Use: Never used  Substance and Sexual Activity   Alcohol use: Yes    Comment: rarely   Drug use: Not Currently   Sexual activity: Yes  Other Topics Concern   Not on file  Social History Narrative   Married.   Moved from [place].   Retired.   Social Determinants of Health   Financial Resource Strain: Low Risk    Difficulty of Paying Living Expenses: Not hard at all  Food Insecurity: No Food Insecurity   Worried About Programme researcher, broadcasting/film/video in the Last Year: Never true   Ran Out of Food in the Last Year: Never true  Transportation Needs: No Transportation Needs   Lack of Transportation (Medical): No   Lack of  Transportation (Non-Medical): No  Physical Activity: Inactive   Days of Exercise per Week: 0 days   Minutes of Exercise per Session: 0 min  Stress: No Stress Concern Present   Feeling of Stress : Not at all  Social Connections: Not on file  Intimate Partner Violence: Not At Risk   Fear of Current or Ex-Partner: No   Emotionally Abused: No   Physically Abused: No   Sexually Abused: No    Past Surgical History:  Procedure Laterality Date   COLONOSCOPY WITH PROPOFOL N/A 12/17/2018   Procedure: COLONOSCOPY WITH PROPOFOL;  Surgeon: Virgel Manifold, MD;  Location: [facility] ENDOSCOPY;  Service: Gastroenterology;  Laterality: N/A;   IR IMAGING GUIDED  PORT INSERTION  04/17/2020   JOINT REPLACEMENT Bilateral    REPLACEMENT TOTAL KNEE BILATERAL  2015   TONSILLECTOMY  1960    Family History  Problem Relation Age of Onset   Cancer Mother    Hypertension Mother    Arthritis Father    Asthma Father    Cancer Father    COPD Father    Heart attack Father    Hypertension Sister    Cancer Sister    Diabetes Sister    Asthma Son    Birth defects Maternal Grandfather    Arthritis Paternal Grandmother    Diabetes Paternal Grandmother    Arthritis Paternal Grandfather    Asthma Sister    Cancer Sister    COPD Sister    Arthritis Sister    Asthma Sister    Diabetes Sister    Other Neg Hx        pituitary disorder    Allergies  Allergen Reactions   Bupropion     Racing heart   Hydrochlorothiazide Other (See Comments)    Cramping to lower extremities    Current Outpatient Medications on File Prior to Visit  Medication Sig Dispense Refill   albuterol (VENTOLIN HFA) 108 (90 Base) MCG/ACT inhaler Inhale 2 puffs into the lungs every 6 (six) hours as needed for wheezing or shortness of breath. 18 g 0   amLODipine (NORVASC) 10 MG tablet TAKE 1 TABLET BY MOUTH EVERY DAY for blood pressure. (Patient taking differently: Take 10 mg by mouth daily.) 90 tablet 1   B-D 3CC LUER-LOK SYR 22GX1" 22G X 1" 3 ML MISC USE AS INSTRUCTED FOR TESTOSTERONE INJECTION EVERY 2 WEEKS 10 each 2   Black Pepper-Turmeric (TURMERIC COMPLEX/BLACK PEPPER PO) Take 1 tablet by mouth in the morning and at bedtime.     blood glucose meter kit and supplies KIT Dispense based on patient and insurance preference. Use up to four times daily as directed. 1 each 0   chlorpheniramine-HYDROcodone (TUSSIONEX) 10-8 MG/5ML SUER Take 5 mLs by mouth at bedtime as needed for cough. 473 mL 0   ELIQUIS 5 MG TABS tablet TAKE 1 TABLET BY MOUTH TWICE A DAY (Patient taking differently: Take 5 mg by mouth 2 (two) times daily.) 180 tablet 2   esomeprazole (NEXIUM) 20 MG capsule Take 20 mg by  mouth daily.      gabapentin (NEURONTIN) 300 MG capsule Take 300 mg by mouth at bedtime.     glipiZIDE (GLUCOTROL) 10 MG tablet TAKE 1 TABLET (10 MG TOTAL) BY MOUTH 2 (TWO) TIMES DAILY BEFORE A MEAL. FOR DIABETES. (Patient taking differently: Take 10 mg by mouth 2 (two) times daily before a meal.) 180 tablet 1   Insulin Glargine (BASAGLAR KWIKPEN) 100 UNIT/ML Inject 20 Units into the skin at bedtime.  For diabetes. 15 mL 3   Insulin Pen Needle (BD PEN NEEDLE NANO U/F) 32G X 4 MM MISC Use with insulin as prescribed  Dx Code: E11.9 100 each 2   ipratropium-albuterol (DUONEB) 0.5-2.5 (3) MG/3ML SOLN Take 3 mLs by nebulization every 4 (four) hours while awake for 3 days, THEN 3 mLs every 4 (four) hours as needed (shortness of breath or wheezing). 360 mL 0   Lancets MISC USE UP TO 3 TIMES DAILY AS DIRECTED 100 each 2   lidocaine-prilocaine (EMLA) cream Apply 1 application topically as needed. (Patient taking differently: Apply 1 application topically as needed (port access).) 30 g 2   losartan (COZAAR) 25 MG tablet Take 1 tablet (25 mg total) by mouth daily. For blood pressure. 90 tablet 0   Melatonin 10 MG CAPS Take 10 mg by mouth at bedtime.     metFORMIN (GLUCOPHAGE) 1000 MG tablet TAKE 1 TABLET (1,000 MG TOTAL) BY MOUTH 2 (TWO) TIMES DAILY WITH A MEAL. FOR DIABETES (Patient taking differently: Take 1,000 mg by mouth 2 (two) times daily with a meal.) 180 tablet 1   Multiple Vitamin (MULTI-VITAMINS) TABS Take 1 tablet by mouth daily.      Omega-3 Fatty Acids (SALMON OIL PO) Take 1 capsule by mouth daily.     ONETOUCH ULTRA test strip USE UP TO 4 TIMES DAILY AS DIRECTED 100 strip 5   oxymetazoline (AFRIN) 0.05 % nasal spray Place 1 spray into both nostrils 2 (two) times daily as needed for congestion.     pioglitazone (ACTOS) 45 MG tablet TAKE 1 TABLET (45 MG TOTAL) BY MOUTH DAILY. FOR DIABETES. (Patient taking differently: Take 45 mg by mouth daily.) 90 tablet 0   pravastatin (PRAVACHOL) 40 MG tablet  TAKE 1 TABLET BY MOUTH EVERY DAY IN THE EVENING FOR CHOLESTEROL (Patient taking differently: Take 40 mg by mouth daily.) 90 tablet 1   testosterone cypionate (DEPOTESTOSTERONE CYPIONATE) 200 MG/ML injection Inject 200 mg into the muscle every 14 (fourteen) days.     pseudoephedrine-acetaminophen (TYLENOL SINUS) 30-500 MG TABS tablet Take 2 tablets by mouth at bedtime. (Patient not taking: Reported on 10/18/2021)     No current facility-administered medications on file prior to visit.    BP 140/82   Pulse (!) 107   Temp 97.6 F (36.4 C) (Temporal)   Ht $R'5\' 9"'ia$  (1.753 m)   Wt 282 lb (127.9 kg)   SpO2 95%   BMI 41.64 kg/m  Objective:   Physical Exam Constitutional:      General: He is not in acute distress.    Appearance: He is not ill-appearing.  Cardiovascular:     Rate and Rhythm: Normal rate and regular rhythm.  Pulmonary:     Effort: Pulmonary effort is normal.     Breath sounds: Examination of the right-upper field reveals decreased breath sounds. Examination of the left-upper field reveals wheezing. Decreased breath sounds and wheezing present. No rhonchi or rales.  Musculoskeletal:     Cervical back: Neck supple.  Skin:    General: Skin is warm and dry.  Neurological:     Mental Status: He is alert and oriented to person, place, and time.          Assessment & Plan:      This visit occurred during the SARS-CoV-2 public health emergency.  Safety protocols were in place, including screening questions prior to the visit, additional usage of staff PPE, and extensive cleaning of exam room while observing appropriate contact time as  indicated for disinfecting solutions.

## 2021-10-18 NOTE — Progress Notes (Deleted)
New Patient Office Visit  Subjective:  Patient ID: Adam Wise, male    DOB: 04-Dec-1951  Age: 70 y.o. MRN: [id number]  CC:  Chief Complaint  Patient presents with   Hospitalization Follow-up    In [facility] from 10/28-10/30    HPI Adam Wise presents for ***  Past Medical History:  Diagnosis Date   Cancer [facility])    Chickenpox    Chronic knee pain    Chronic low back pain    Essential hypertension    GERD (gastroesophageal reflux disease)    OSA (obstructive sleep apnea)    Testosterone deficiency    Type 2 diabetes mellitus (HCC)     Past Surgical History:  Procedure Laterality Date   COLONOSCOPY WITH PROPOFOL N/A 12/17/2018   Procedure: COLONOSCOPY WITH PROPOFOL;  Surgeon: Pasty Spillers, MD;  Location: [facility] ENDOSCOPY;  Service: Gastroenterology;  Laterality: N/A;   IR IMAGING GUIDED PORT INSERTION  04/17/2020   JOINT REPLACEMENT Bilateral    REPLACEMENT TOTAL KNEE BILATERAL  2015   TONSILLECTOMY  1960    Family History  Problem Relation Age of Onset   Cancer Mother    Hypertension Mother    Arthritis Father    Asthma Father    Cancer Father    COPD Father    Heart attack Father    Hypertension Sister    Cancer Sister    Diabetes Sister    Asthma Son    Birth defects Maternal Grandfather    Arthritis Paternal Grandmother    Diabetes Paternal Grandmother    Arthritis Paternal Grandfather    Asthma Sister    Cancer Sister    COPD Sister    Arthritis Sister    Asthma Sister    Diabetes Sister    Other Neg Hx        pituitary disorder    Social History   Socioeconomic History   Marital status: Married    Spouse name: Not on file   Number of children: Not on file   Years of education: Not on file   Highest education level: Not on file  Occupational History   Not on file  Tobacco Use   Smoking status: Former   Smokeless tobacco: Never  Vaping Use   Vaping Use: Never used  Substance and Sexual Activity   Alcohol use: Yes    Comment: rarely    Drug use: Not Currently   Sexual activity: Yes  Other Topics Concern   Not on file  Social History Narrative   Married.   Moved from [place].   Retired.   Social Determinants of Health   Financial Resource Strain: Low Risk    Difficulty of Paying Living Expenses: Not hard at all  Food Insecurity: No Food Insecurity   Worried About Programme researcher, broadcasting/film/video in the Last Year: Never true   Ran Out of Food in the Last Year: Never true  Transportation Needs: No Transportation Needs   Lack of Transportation (Medical): No   Lack of Transportation (Non-Medical): No  Physical Activity: Inactive   Days of Exercise per Week: 0 days   Minutes of Exercise per Session: 0 min  Stress: No Stress Concern Present   Feeling of Stress : Not at all  Social Connections: Not on file  Intimate Partner Violence: Not At Risk   Fear of Current or Ex-Partner: No   Emotionally Abused: No   Physically Abused: No   Sexually Abused: No    ROS Review of  Systems  Objective:   Today's Vitals: BP 140/82   Pulse (!) 107   Temp 97.6 F (36.4 C) (Temporal)   Ht $R'5\' 9"'As$  (1.753 m)   Wt 282 lb (127.9 kg)   SpO2 95%   BMI 41.64 kg/m   Physical Exam  Assessment & Plan:   Problem List Items Addressed This Visit     Essential hypertension - Primary   Relevant Medications   losartan (COZAAR) 50 MG tablet   Other Relevant Orders   Basic metabolic panel   CBC   Other Visit Diagnoses     Exertional dyspnea           Outpatient Encounter Medications as of 10/18/2021  Medication Sig   albuterol (VENTOLIN HFA) 108 (90 Base) MCG/ACT inhaler Inhale 2 puffs into the lungs every 6 (six) hours as needed for wheezing or shortness of breath.   amLODipine (NORVASC) 10 MG tablet TAKE 1 TABLET BY MOUTH EVERY DAY for blood pressure. (Patient taking differently: Take 10 mg by mouth daily.)   B-D 3CC LUER-LOK SYR 22GX1" 22G X 1" 3 ML MISC USE AS INSTRUCTED FOR TESTOSTERONE INJECTION EVERY 2 WEEKS   Black Pepper-Turmeric  (TURMERIC COMPLEX/BLACK PEPPER PO) Take 1 tablet by mouth in the morning and at bedtime.   blood glucose meter kit and supplies KIT Dispense based on patient and insurance preference. Use up to four times daily as directed.   chlorpheniramine-HYDROcodone (TUSSIONEX) 10-8 MG/5ML SUER Take 5 mLs by mouth at bedtime as needed for cough.   ELIQUIS 5 MG TABS tablet TAKE 1 TABLET BY MOUTH TWICE A DAY (Patient taking differently: Take 5 mg by mouth 2 (two) times daily.)   esomeprazole (NEXIUM) 20 MG capsule Take 20 mg by mouth daily.    gabapentin (NEURONTIN) 300 MG capsule Take 300 mg by mouth at bedtime.   glipiZIDE (GLUCOTROL) 10 MG tablet TAKE 1 TABLET (10 MG TOTAL) BY MOUTH 2 (TWO) TIMES DAILY BEFORE A MEAL. FOR DIABETES. (Patient taking differently: Take 10 mg by mouth 2 (two) times daily before a meal.)   Insulin Glargine (BASAGLAR KWIKPEN) 100 UNIT/ML Inject 20 Units into the skin at bedtime. For diabetes.   Insulin Pen Needle (BD PEN NEEDLE NANO U/F) 32G X 4 MM MISC Use with insulin as prescribed  Dx Code: E11.9   ipratropium-albuterol (DUONEB) 0.5-2.5 (3) MG/3ML SOLN Take 3 mLs by nebulization every 4 (four) hours while awake for 3 days, THEN 3 mLs every 4 (four) hours as needed (shortness of breath or wheezing).   Lancets MISC USE UP TO 3 TIMES DAILY AS DIRECTED   lidocaine-prilocaine (EMLA) cream Apply 1 application topically as needed. (Patient taking differently: Apply 1 application topically as needed (port access).)   losartan (COZAAR) 50 MG tablet Take 1 tablet (50 mg total) by mouth daily. For blood pressure.   Melatonin 10 MG CAPS Take 10 mg by mouth at bedtime.   metFORMIN (GLUCOPHAGE) 1000 MG tablet TAKE 1 TABLET (1,000 MG TOTAL) BY MOUTH 2 (TWO) TIMES DAILY WITH A MEAL. FOR DIABETES (Patient taking differently: Take 1,000 mg by mouth 2 (two) times daily with a meal.)   Multiple Vitamin (MULTI-VITAMINS) TABS Take 1 tablet by mouth daily.    Omega-3 Fatty Acids (SALMON OIL PO) Take 1  capsule by mouth daily.   ONETOUCH ULTRA test strip USE UP TO 4 TIMES DAILY AS DIRECTED   oxymetazoline (AFRIN) 0.05 % nasal spray Place 1 spray into both nostrils 2 (two) times daily as needed  for congestion.   pioglitazone (ACTOS) 45 MG tablet TAKE 1 TABLET (45 MG TOTAL) BY MOUTH DAILY. FOR DIABETES. (Patient taking differently: Take 45 mg by mouth daily.)   pravastatin (PRAVACHOL) 40 MG tablet TAKE 1 TABLET BY MOUTH EVERY DAY IN THE EVENING FOR CHOLESTEROL (Patient taking differently: Take 40 mg by mouth daily.)   testosterone cypionate (DEPOTESTOSTERONE CYPIONATE) 200 MG/ML injection Inject 200 mg into the muscle every 14 (fourteen) days.   [DISCONTINUED] losartan (COZAAR) 25 MG tablet Take 1 tablet (25 mg total) by mouth daily. For blood pressure.   pseudoephedrine-acetaminophen (TYLENOL SINUS) 30-500 MG TABS tablet Take 2 tablets by mouth at bedtime. (Patient not taking: Reported on 10/18/2021)   [DISCONTINUED] OVER THE COUNTER MEDICATION Take by mouth.   No facility-administered encounter medications on file as of 10/18/2021.    Follow-up: No follow-ups on file.   Selinda Orion

## 2021-10-18 NOTE — Assessment & Plan Note (Signed)
A1c of 8.1 in August 2020.  Recommended he move Basaglar to morning, continue 20 units.  Continue metformin 1000 mg twice daily, glipizide 10 mg twice daily.  We will plan to see him back in 2 months for diabetes follow-up.

## 2021-10-21 LAB — ACID FAST SMEAR (AFB, MYCOBACTERIA): Acid Fast Smear: NEGATIVE

## 2021-10-22 ENCOUNTER — Other Ambulatory Visit: Payer: Self-pay | Admitting: Primary Care

## 2021-10-24 DIAGNOSIS — Z20822 Contact with and (suspected) exposure to covid-19: Secondary | ICD-10-CM | POA: Diagnosis not present

## 2021-10-24 NOTE — Progress Notes (Signed)
[facility] OFFICE PROGRESS NOTE  [name], NP [place] [address]  DIAGNOSIS: Relapsed extensive stage (T4, N3, M1c)  small cell lung cancer diagnosed in April 2021 and presented with large right upper lobe/right hilar mass with ipsilateral and contralateral mediastinal and right supraclavicular lymphadenopathy in addition to multiple liver lesios. He has disease progression after the first line of chemotherapy in December 2021.  PRIOR THERAPY: 1) Palliative radiotherapy to the right upper lobe lung mass under the care of Dr. Lisbeth Renshaw. 2) Systemic chemotherapy with carboplatin for AUC of 5 on day 1, etoposide 100 mg/M2 on days 1, 2 and 3 in addition to Imfinzi 1500 mg IV every 3 weeks with chemotherapy then every 4 weeks for maintenance if the patient has no evidence for progression.  He will also receive Cosela 240 mg/m2 on the days of the chemotherapy.  Status post 9 cycles.  Starting from cycle #5 the patient will be on maintenance treatment with immunotherapy with Imfinzi 1500 mg IV every 4 weeks. Last dose of chemotherapy was given on November 13, 2020. This treatment was discontinued secondary to disease progression 3) Systemic chemotherapy with carboplatin for AUC of 5 on day 1, etoposide 100 mg/M2 on days 1, 2 and 3, Tecentriq 1200 mg IV every 3 weeks as well as Cosela 250 mg/M2 on the days of the chemotherapy every 3 weeks.  First dose December 18, 2020.  Status post 8 cycles.  CURRENT THERAPY:  Zepzelca 3.2 mgm2 IV every 3 weeks. First dose expected on 06/05/21. Status post 7 cycles.   INTERVAL HISTORY: [facility] 70 y.o. male returns to the clinic today for a follow-up visit accompanied by his daughter.  The patient is feeling fairly well today without any concerning complaints except for shortness of breath.  In the interval since his last appointment, the patient presented to the emergency room on 10/11/2021 for worsening shortness of breath and  acute respiratory failure.  He underwent a thoracentesis with improvement of his shortness of breath.  They yeilded 1.2 L of fluid. The patient averages a thoracenteses every 8 weeks or per chart review. However, the patient is very concerned because he had a restaging CT scan a week before his hospital admission and had increased effusion in the span of a week. He was discussing his situation with a friend who had similar issues who had a pleurx and eventual talc pleurodesis. The patient is wondering if he can be considered for pleurex since he is concerned about having it re accumulate quickly and requiring hospital admissions. He was treated for possible pneumonia and COPD exacerbation. Of note, he has a nebulizer at home which has been helpful for him.   Since he had his thoracentesis a few weeks ago, he thinks he is ok for now but usually can tell when he has re accumulated fluid due to worsening cough with laying down. He also gets right sided back pain with re accumulation of fluid. He does feel like his shortness of breath is starting to increase again.   Otherwise he denies any recent fever, chills, or night sweats.  For cough, he has a prescription for tussinex. The patient is a history of PE and is compliant with his blood thinner.  He denies any chest pain.  Denies any nausea, vomiting, diarrhea, or constipation.  He denies headaches. He has some increased blurry vision and states he is overdue for his eye exam. He has diabetes and is  aware he needs his eye exam annually. The patient is here today for evaluation and repeat blood work before starting cycle #8.   MEDICAL HISTORY: Past Medical History:  Diagnosis Date   Cancer [facility])    Chickenpox    Chronic knee pain    Chronic low back pain    Essential hypertension    GERD (gastroesophageal reflux disease)    OSA (obstructive sleep apnea)    Testosterone deficiency    Type 2 diabetes mellitus (HCC)     ALLERGIES:  is allergic to  bupropion and hydrochlorothiazide.  MEDICATIONS:  Current Outpatient Medications  Medication Sig Dispense Refill   albuterol (VENTOLIN HFA) 108 (90 Base) MCG/ACT inhaler Inhale 2 puffs into the lungs every 6 (six) hours as needed for wheezing or shortness of breath. 18 g 0   amLODipine (NORVASC) 10 MG tablet TAKE 1 TABLET BY MOUTH EVERY DAY for blood pressure. (Patient taking differently: Take 10 mg by mouth daily.) 90 tablet 1   B-D 3CC LUER-LOK SYR 22GX1" 22G X 1" 3 ML MISC USE AS INSTRUCTED FOR TESTOSTERONE INJECTION EVERY 2 WEEKS 10 each 2   Black Pepper-Turmeric (TURMERIC COMPLEX/BLACK PEPPER PO) Take 1 tablet by mouth in the morning and at bedtime.     blood glucose meter kit and supplies KIT Dispense based on patient and insurance preference. Use up to four times daily as directed. 1 each 0   chlorpheniramine-HYDROcodone (TUSSIONEX) 10-8 MG/5ML SUER Take 5 mLs by mouth at bedtime as needed for cough. 473 mL 0   ELIQUIS 5 MG TABS tablet TAKE 1 TABLET BY MOUTH TWICE A DAY (Patient taking differently: Take 5 mg by mouth 2 (two) times daily.) 180 tablet 2   esomeprazole (NEXIUM) 20 MG capsule Take 20 mg by mouth daily.      gabapentin (NEURONTIN) 300 MG capsule Take 300 mg by mouth at bedtime.     glipiZIDE (GLUCOTROL) 10 MG tablet TAKE 1 TABLET (10 MG TOTAL) BY MOUTH 2 (TWO) TIMES DAILY BEFORE A MEAL. FOR DIABETES. (Patient taking differently: Take 10 mg by mouth 2 (two) times daily before a meal.) 180 tablet 1   Insulin Glargine (BASAGLAR KWIKPEN) 100 UNIT/ML Inject 20 Units into the skin at bedtime. For diabetes. 15 mL 3   Insulin Pen Needle (BD PEN NEEDLE NANO U/F) 32G X 4 MM MISC Use with insulin as prescribed  Dx Code: E11.9 100 each 2   ipratropium-albuterol (DUONEB) 0.5-2.5 (3) MG/3ML SOLN Take 3 mLs by nebulization every 4 (four) hours while awake for 3 days, THEN 3 mLs every 4 (four) hours as needed (shortness of breath or wheezing). 360 mL 0   Lancets MISC USE UP TO 3 TIMES DAILY AS  DIRECTED 100 each 2   lidocaine-prilocaine (EMLA) cream Apply 1 application topically as needed. (Patient taking differently: Apply 1 application topically as needed (port access).) 30 g 2   losartan (COZAAR) 50 MG tablet Take 1 tablet (50 mg total) by mouth daily. For blood pressure. 90 tablet 3   Melatonin 10 MG CAPS Take 10 mg by mouth at bedtime.     metFORMIN (GLUCOPHAGE) 1000 MG tablet TAKE 1 TABLET (1,000 MG TOTAL) BY MOUTH 2 (TWO) TIMES DAILY WITH A MEAL. FOR DIABETES (Patient taking differently: Take 1,000 mg by mouth 2 (two) times daily with a meal.) 180 tablet 1   Multiple Vitamin (MULTI-VITAMINS) TABS Take 1 tablet by mouth daily.      Omega-3 Fatty Acids (SALMON OIL PO) Take 1 capsule  by mouth daily.     ONETOUCH ULTRA test strip USE UP TO 4 TIMES DAILY AS DIRECTED 100 strip 5   oxymetazoline (AFRIN) 0.05 % nasal spray Place 1 spray into both nostrils 2 (two) times daily as needed for congestion.     pioglitazone (ACTOS) 45 MG tablet TAKE 1 TABLET (45 MG TOTAL) BY MOUTH DAILY. FOR DIABETES. (Patient taking differently: Take 45 mg by mouth daily.) 90 tablet 0   pravastatin (PRAVACHOL) 40 MG tablet TAKE 1 TABLET BY MOUTH EVERY DAY IN THE EVENING FOR CHOLESTEROL (Patient taking differently: Take 40 mg by mouth daily.) 90 tablet 1   pseudoephedrine-acetaminophen (TYLENOL SINUS) 30-500 MG TABS tablet Take 2 tablets by mouth at bedtime.     testosterone cypionate (DEPOTESTOSTERONE CYPIONATE) 200 MG/ML injection Inject 200 mg into the muscle every 14 (fourteen) days.     No current facility-administered medications for this visit.   Facility-Administered Medications Ordered in Other Visits  Medication Dose Route Frequency Provider Last Rate Last Admin   heparin lock flush 100 unit/mL  500 Units Intracatheter Once PRN Curt Bears, MD       lurbinectedin [facility]) 8 mg in sodium chloride 0.9 % 250 mL chemo infusion  3.13 mg/m2 (Treatment Plan Recorded) Intravenous Once Curt Bears,  MD 266 mL/hr at 10/29/21 1231 8 mg at 10/29/21 1231   sodium chloride flush (NS) 0.9 % injection 10 mL  10 mL Intracatheter PRN Curt Bears, MD        SURGICAL HISTORY:  Past Surgical History:  Procedure Laterality Date   COLONOSCOPY WITH PROPOFOL N/A 12/17/2018   Procedure: COLONOSCOPY WITH PROPOFOL;  Surgeon: Virgel Manifold, MD;  Location: [facility] ENDOSCOPY;  Service: Gastroenterology;  Laterality: N/A;   IR IMAGING GUIDED PORT INSERTION  04/17/2020   JOINT REPLACEMENT Bilateral    REPLACEMENT TOTAL KNEE BILATERAL  2015   TONSILLECTOMY  1960    REVIEW OF SYSTEMS:   Review of Systems  Constitutional: Negative for appetite change, chills, fatigue, fever and unexpected weight change.  HENT: Negative for mouth sores, nosebleeds, sore throat and trouble swallowing.   Eyes: Negative for eye problems and icterus.  Respiratory: Positive for increased dyspnea on exertion. Cough improved compared to prior. Negative for hemoptysis and wheezing.   Cardiovascular: Negative for chest pain and leg swelling.  Gastrointestinal: Negative for abdominal pain, constipation, diarrhea, nausea and vomiting.  Genitourinary: Negative for bladder incontinence, difficulty urinating, dysuria, frequency and hematuria.   Musculoskeletal: Positive for back discomfort with increased right pleural effusion. Negative for  gait problem, neck pain and neck stiffness.  Skin: Negative for itching and rash.  Neurological: Negative for dizziness, extremity weakness, gait problem, headaches, light-headedness and seizures.  Hematological: Negative for adenopathy. Does not bruise/bleed easily.  Psychiatric/Behavioral: Negative for confusion, depression and sleep disturbance. The patient is not nervous/anxious.     PHYSICAL EXAMINATION:  There were no vitals taken for this visit.  ECOG PERFORMANCE STATUS: 1  Physical Exam  Constitutional: Oriented to person, place, and time and well-developed, well-nourished, and in  no distress.  HENT:  Head: Normocephalic and atraumatic.  Mouth/Throat: Oropharynx is clear and moist. No oropharyngeal exudate.  Eyes: Conjunctivae are normal. Right eye exhibits no discharge. Left eye exhibits no discharge. No scleral icterus.  Neck: Normal range of motion. Neck supple.  Cardiovascular: Normal rate, regular rhythm, normal heart sounds and intact distal pulses.   Pulmonary/Chest: Effort normal. Decreased breath sounds at base of right lower lung field. No respiratory distress. No wheezes. No  rales.  Abdominal: Soft. Bowel sounds are normal. Exhibits no distension and no mass. There is no tenderness.  Musculoskeletal: Normal range of motion. Exhibits no edema.  Lymphadenopathy:    No cervical adenopathy.  Neurological: Alert and oriented to person, place, and time. Exhibits normal muscle tone. Gait normal. Coordination normal.  Skin: Skin is warm and dry. No rash noted. Not diaphoretic. No erythema. No pallor.  Psychiatric: Mood, memory and judgment normal.  Vitals reviewed.  LABORATORY DATA: Lab Results  Component Value Date   WBC 4.7 10/29/2021   HGB 12.3 (L) 10/29/2021   HCT 37.2 (L) 10/29/2021   MCV 91.6 10/29/2021   PLT 232 10/29/2021      Chemistry      Component Value Date/Time   NA 140 10/29/2021 0953   K 4.4 10/29/2021 0953   CL 105 10/29/2021 0953   CO2 26 10/29/2021 0953   BUN 16 10/29/2021 0953   CREATININE 1.29 (H) 10/29/2021 0953   CREATININE 1.45 (H) 10/18/2021 1533      Component Value Date/Time   CALCIUM 9.2 10/29/2021 0953   ALKPHOS 65 10/29/2021 0953   AST 18 10/29/2021 0953   ALT 20 10/29/2021 0953   BILITOT 0.3 10/29/2021 0953       RADIOGRAPHIC STUDIES:  CT Chest W Contrast  Result Date: 10/07/2021 CLINICAL DATA:  Metastatic lung cancer restaging EXAM: CT CHEST, ABDOMEN, AND PELVIS WITH CONTRAST TECHNIQUE: Multidetector CT imaging of the chest, abdomen and pelvis was performed following the standard protocol during bolus  administration of intravenous contrast. CONTRAST:  58mL OMNIPAQUE IOHEXOL 350 MG/ML SOLN, additional oral enteric contrast COMPARISON:  08/07/2021, 05/16/2021 FINDINGS: CT CHEST FINDINGS Cardiovascular: Right chest port catheter. Aortic atherosclerosis. Normal heart size. Three-vessel coronary artery calcifications. No pericardial effusion. Mediastinum/Nodes: Unchanged posttreatment appearance of soft tissue about the right hilum (series 2, image 25). No discretely enlarged mediastinal, hilar, or axillary lymph nodes. Thyroid gland, trachea, and esophagus demonstrate no significant findings. Lungs/Pleura: Unchanged posttreatment/postradiation appearance of the right lung with perihilar and suprahilar fibrosis and consolidation. Moderate right pleural effusion, not significantly changed compared to prior examination. Occasional tiny pulmonary nodules are unchanged, for example a 2 mm nodule in the superior segment left lower lobe (series 4, image 70) and a 2 mm nodule in the peripheral left upper lobe (series 4, image 45). Musculoskeletal: No chest wall mass or suspicious bone lesions identified. CT ABDOMEN PELVIS FINDINGS Hepatobiliary: Multiple small hypodense liver lesions are unchanged, for example a subcapsular lesion of the anterior right lobe of the liver measuring 1.7 x 0.9 cm (series 2, image 59) and a lesion of the inferior left lobe measuring 1.2 x 1.0 cm (series 3, image 54). No gallstones, gallbladder wall thickening, or biliary dilatation. Pancreas: Unremarkable. No pancreatic ductal dilatation or surrounding inflammatory changes. Spleen: Normal in size without significant abnormality. Adrenals/Urinary Tract: Unchanged nodule of the right adrenal gland measuring 1.7 x 1.5 cm (series 2, image 57). Exophytic simple cysts in the right kidney. Kidneys are otherwise normal, without renal calculi, solid lesion, or hydronephrosis. Thickening of the urinary bladder multiple likely related to chronic outlet  obstruction. Stomach/Bowel: Stomach is within normal limits. Appendix appears normal. No evidence of bowel wall thickening, distention, or inflammatory changes. Sigmoid diverticula. Vascular/Lymphatic: Aortic atherosclerosis. No enlarged abdominal or pelvic lymph nodes. Reproductive: Mild prostatomegaly. Other: No abdominal wall hernia or abnormality. No abdominopelvic ascites. Musculoskeletal: No acute or significant osseous findings. Chronic bilateral pars defects of L5. IMPRESSION: 1. Unchanged posttreatment/postradiation appearance of the right  lung with perihilar and suprahilar fibrosis and consolidation. Moderate right pleural effusion, not significantly changed compared to prior examination. 2. Multiple small hypodense liver lesions are unchanged, consistent with stable hepatic metastatic disease. 3. Occasional tiny pulmonary nodules are unchanged, measuring 2 mm and smaller, almost certainly benign and incidental, continued attention on follow-up. 4. Unchanged nodule of the right adrenal gland measuring 1.7 x 1.5 cm, which remains nonspecific but likely a small, incidental adenoma. Continued attention on follow-up. 5. No evidence of new metastatic disease in the chest, abdomen, or pelvis. 6. Prostatomegaly. 7. Coronary artery disease. Aortic Atherosclerosis (ICD10-I70.0). Electronically Signed   By: Delanna Ahmadi M.D.   On: 10/07/2021 11:14   CT Angio Chest PE W and/or Wo Contrast  Result Date: 10/11/2021 CLINICAL DATA:  History of lung cancer, elevated D-dimer, shortness of breath, wheezing EXAM: CT ANGIOGRAPHY CHEST WITH CONTRAST TECHNIQUE: Multidetector CT imaging of the chest was performed using the standard protocol during bolus administration of intravenous contrast. Multiplanar CT image reconstructions and MIPs were obtained to evaluate the vascular anatomy. CONTRAST:  38mL OMNIPAQUE IOHEXOL 350 MG/ML SOLN COMPARISON:  CT chest 10/04/2021 FINDINGS: Cardiovascular: The pulmonary arteries are  adequately opacified to the segmental level. There is no evidence of pulmonary embolism. The heart is stable in size. There is no pericardial effusion. There is mild calcified atherosclerotic plaque of the thoracic aorta. The main pulmonary artery is enlarged measuring up to 3.6 cm. Mediastinum/Nodes: The thyroid is unremarkable. The esophagus is grossly unremarkable. There is no mediastinal lymphadenopathy. A 7 mm left hilar lymph node is unchanged. There is no pathologically enlarged mediastinal or hilar lymph node. There is no axillary lymphadenopathy. Lungs/Pleura: The trachea and main airways are patent, though there is moderate narrowing of the right main bronchus, similar to the prior study. There is unchanged masslike consolidative opacity in the medial right upper lobe/suprahilar region abutting the mediastinum. A focus of nodular opacity in the right apex superolateral to the dominant consolidation measuring 1.0 cm is unchanged compared to the most recent study but is increased in conspicuity compared to the remote study from 03/11/2021. There is patchy ground-glass opacity and interlobular septal thickening predominantly in the left lung. There are patchy nodular opacities in the left apex which are new since 10/04/2021. There is partial collapse of the medial right lower lobe which is new since 10/04/2021. The collapsed lung demonstrates postcontrast enhancement. There is a moderate right pleural effusion, increased in size since 10/05/2020. There is no left pleural effusion. There is no pneumothorax. Upper Abdomen: A left renal cyst is partially imaged. A small right adrenal nodule is unchanged. The imaged portions of the upper abdominal viscera are otherwise unremarkable. Musculoskeletal: There is multilevel degenerative change of the thoracic spine. There is no acute osseous abnormality or aggressive osseous lesion. Review of the MIP images confirms the above findings. IMPRESSION: 1. No evidence of  pulmonary embolism. 2. Increased size of a moderate right pleural effusion with new partial collapse of the medial right lower lobe. 3. New ground-glass opacity and interlobular septal thickening predominantly in the left lung suggesting mild to moderate pulmonary interstitial edema. 4. New patchy nodular opacities in the left apex may be infectious or inflammatory in nature. 5. Post treatment change in the right upper lobe is again seen. Focal nodular opacity in the superior right apex is unchanged compared to the most recent study but is increasing in conspicuity compared to more remote studies. 6. Recommend close interval follow-up in 1-3 months to reassess the  above findings. Electronically Signed   By: Valetta Mole M.D.   On: 10/11/2021 12:51   CT Abdomen Pelvis W Contrast  Result Date: 10/07/2021 CLINICAL DATA:  Metastatic lung cancer restaging EXAM: CT CHEST, ABDOMEN, AND PELVIS WITH CONTRAST TECHNIQUE: Multidetector CT imaging of the chest, abdomen and pelvis was performed following the standard protocol during bolus administration of intravenous contrast. CONTRAST:  31mL OMNIPAQUE IOHEXOL 350 MG/ML SOLN, additional oral enteric contrast COMPARISON:  08/07/2021, 05/16/2021 FINDINGS: CT CHEST FINDINGS Cardiovascular: Right chest port catheter. Aortic atherosclerosis. Normal heart size. Three-vessel coronary artery calcifications. No pericardial effusion. Mediastinum/Nodes: Unchanged posttreatment appearance of soft tissue about the right hilum (series 2, image 25). No discretely enlarged mediastinal, hilar, or axillary lymph nodes. Thyroid gland, trachea, and esophagus demonstrate no significant findings. Lungs/Pleura: Unchanged posttreatment/postradiation appearance of the right lung with perihilar and suprahilar fibrosis and consolidation. Moderate right pleural effusion, not significantly changed compared to prior examination. Occasional tiny pulmonary nodules are unchanged, for example a 2 mm nodule  in the superior segment left lower lobe (series 4, image 70) and a 2 mm nodule in the peripheral left upper lobe (series 4, image 45). Musculoskeletal: No chest wall mass or suspicious bone lesions identified. CT ABDOMEN PELVIS FINDINGS Hepatobiliary: Multiple small hypodense liver lesions are unchanged, for example a subcapsular lesion of the anterior right lobe of the liver measuring 1.7 x 0.9 cm (series 2, image 59) and a lesion of the inferior left lobe measuring 1.2 x 1.0 cm (series 3, image 54). No gallstones, gallbladder wall thickening, or biliary dilatation. Pancreas: Unremarkable. No pancreatic ductal dilatation or surrounding inflammatory changes. Spleen: Normal in size without significant abnormality. Adrenals/Urinary Tract: Unchanged nodule of the right adrenal gland measuring 1.7 x 1.5 cm (series 2, image 57). Exophytic simple cysts in the right kidney. Kidneys are otherwise normal, without renal calculi, solid lesion, or hydronephrosis. Thickening of the urinary bladder multiple likely related to chronic outlet obstruction. Stomach/Bowel: Stomach is within normal limits. Appendix appears normal. No evidence of bowel wall thickening, distention, or inflammatory changes. Sigmoid diverticula. Vascular/Lymphatic: Aortic atherosclerosis. No enlarged abdominal or pelvic lymph nodes. Reproductive: Mild prostatomegaly. Other: No abdominal wall hernia or abnormality. No abdominopelvic ascites. Musculoskeletal: No acute or significant osseous findings. Chronic bilateral pars defects of L5. IMPRESSION: 1. Unchanged posttreatment/postradiation appearance of the right lung with perihilar and suprahilar fibrosis and consolidation. Moderate right pleural effusion, not significantly changed compared to prior examination. 2. Multiple small hypodense liver lesions are unchanged, consistent with stable hepatic metastatic disease. 3. Occasional tiny pulmonary nodules are unchanged, measuring 2 mm and smaller, almost  certainly benign and incidental, continued attention on follow-up. 4. Unchanged nodule of the right adrenal gland measuring 1.7 x 1.5 cm, which remains nonspecific but likely a small, incidental adenoma. Continued attention on follow-up. 5. No evidence of new metastatic disease in the chest, abdomen, or pelvis. 6. Prostatomegaly. 7. Coronary artery disease. Aortic Atherosclerosis (ICD10-I70.0). Electronically Signed   By: Delanna Ahmadi M.D.   On: 10/07/2021 11:14   DG Chest Port 1 View  Result Date: 10/12/2021 CLINICAL DATA:  70 year old male status post thoracentesis EXAM: PORTABLE CHEST 1 VIEW COMPARISON:  Chest x-ray 10/11/2021 FINDINGS: No evidence of pneumothorax. Decreased right-sided pleural effusion. Right IJ single-lumen power injectable port catheter remains in unchanged position. No acute osseous abnormality. IMPRESSION: No evidence of pneumothorax or other complication following right thoracentesis. Electronically Signed   By: Jacqulynn Cadet M.D.   On: 10/12/2021 13:06   DG Chest [facility]  Result Date: 10/11/2021 CLINICAL DATA:  Dyspnea, wheezing EXAM: PORTABLE CHEST 1 VIEW COMPARISON:  Chest radiograph 09/17/2021, CT chest 10/04/2021 FINDINGS: A right chest wall port is in stable position. The cardiomediastinal silhouette is grossly stable. There is unchanged asymmetric elevation of the right hemidiaphragm. Confluent opacity in the medial right lung with associated architectural distortion and volume loss is similar to the prior chest radiograph and CT. The left lung appears clear. A small amount of pleural fluid is again suspected in the right apex, but the right costophrenic angle appears sharp. There is no left pleural effusion. There is no pneumothorax. IMPRESSION: Unchanged right hilar opacity with associated architectural distortion. No evidence of new/acute pulmonary process. Electronically Signed   By: Valetta Mole M.D.   On: 10/11/2021 11:04   ECHOCARDIOGRAM COMPLETE  Result  Date: 10/12/2021    ECHOCARDIOGRAM REPORT   Patient Name:   [facility] Date of Exam: 10/12/2021 Medical Rec #:  [id number]    Height:       69.0 in Accession #:    [id number]   Weight:       291.0 lb Date of Birth:  1951-08-20    BSA:          2.423 m Patient Age:    37 years     BP:           132/84 mmHg Patient Gender: M            HR:           85 bpm. Exam Location:  Inpatient Procedure: 2D Echo, Color Doppler and Cardiac Doppler Indications:    CHF  History:        Patient has prior history of Echocardiogram examinations. Risk                 Factors:Diabetes, Sleep Apnea and Hypertension.  Sonographer:    Jyl Heinz Referring Phys: [id number] [place]  1. Left ventricular ejection fraction, by estimation, is 50 to 55%. The left ventricle has low normal function. The left ventricle has no regional wall motion abnormalities. There is mild left ventricular hypertrophy. Left ventricular diastolic parameters are indeterminate.  2. Right ventricular systolic function is normal. The right ventricular size is normal.  3. Left atrial size was mildly dilated.  4. The mitral valve is normal in structure. No evidence of mitral valve regurgitation. No evidence of mitral stenosis.  5. The aortic valve is tricuspid. Aortic valve regurgitation is not visualized. No aortic stenosis is present.  6. Aortic dilatation noted. There is borderline dilatation of the aortic root, measuring 38 mm. There is borderline dilatation of the ascending aorta, measuring 38 mm.  7. The inferior vena cava is normal in size with greater than 50% respiratory variability, suggesting right atrial pressure of 3 mmHg. FINDINGS  Left Ventricle: Left ventricular ejection fraction, by estimation, is 50 to 55%. The left ventricle has low normal function. The left ventricle has no regional wall motion abnormalities. The left ventricular internal cavity size was normal in size. There is mild left ventricular hypertrophy. Left ventricular  diastolic parameters are indeterminate. Right Ventricle: The right ventricular size is normal. Right ventricular systolic function is normal. Left Atrium: Left atrial size was mildly dilated. Right Atrium: Right atrial size was normal in size. Pericardium: There is no evidence of pericardial effusion. Mitral Valve: The mitral valve is normal in structure. No evidence of mitral valve regurgitation. No evidence of mitral valve stenosis. Tricuspid Valve: The tricuspid  valve is normal in structure. Tricuspid valve regurgitation is trivial. No evidence of tricuspid stenosis. Aortic Valve: The aortic valve is tricuspid. Aortic valve regurgitation is not visualized. No aortic stenosis is present. Aortic valve peak gradient measures 8.4 mmHg. Pulmonic Valve: The pulmonic valve was not well visualized. Pulmonic valve regurgitation is not visualized. No evidence of pulmonic stenosis. Aorta: Aortic dilatation noted. There is borderline dilatation of the aortic root, measuring 38 mm. There is borderline dilatation of the ascending aorta, measuring 38 mm. Venous: The inferior vena cava is normal in size with greater than 50% respiratory variability, suggesting right atrial pressure of 3 mmHg. IAS/Shunts: No atrial level shunt detected by color flow Doppler.  LEFT VENTRICLE PLAX 2D LVIDd:         4.90 cm      Diastology LVIDs:         3.50 cm      LV e' medial:    14.00 cm/s LV PW:         1.20 cm      LV E/e' medial:  10.0 LV IVS:        1.30 cm      LV e' lateral:   18.50 cm/s LVOT diam:     2.00 cm      LV E/e' lateral: 7.6 LV SV:         81 LV SV Index:   34 LVOT Area:     3.14 cm  LV Volumes (MOD) LV vol d, MOD A2C: 143.0 ml LV vol d, MOD A4C: 155.0 ml LV vol s, MOD A2C: 69.7 ml LV vol s, MOD A4C: 73.1 ml LV SV MOD A2C:     73.3 ml LV SV MOD A4C:     155.0 ml LV SV MOD BP:      78.5 ml RIGHT VENTRICLE            IVC RV Basal diam:  3.60 cm    IVC diam: 2.20 cm RV Mid diam:    3.30 cm RV S prime:     9.08 cm/s TAPSE  (M-mode): 1.6 cm LEFT ATRIUM             Index        RIGHT ATRIUM           Index LA diam:        3.90 cm 1.61 cm/m   RA Area:     20.80 cm LA Vol (A2C):   85.1 ml 35.12 ml/m  RA Volume:   61.80 ml  25.51 ml/m LA Vol (A4C):   67.7 ml 27.94 ml/m LA Biplane Vol: 81.8 ml 33.76 ml/m  AORTIC VALVE AV Area (Vmax): 2.66 cm AV Vmax:        145.00 cm/s AV Peak Grad:   8.4 mmHg LVOT Vmax:      123.00 cm/s LVOT Vmean:     88.850 cm/s LVOT VTI:       0.259 m  AORTA Ao Root diam: 3.80 cm Ao Asc diam:  3.80 cm MITRAL VALVE MV Area (PHT): 5.16 cm     SHUNTS MV Decel Time: 147 msec     Systemic VTI:  0.26 m MV E velocity: 140.00 cm/s  Systemic Diam: 2.00 cm Kirk Ruths MD Electronically signed by Kirk Ruths MD Signature Date/Time: 10/12/2021/11:27:54 AM    Final    US THORACENTESIS ASP PLEURAL SPACE W/IMG GUIDE  Result Date: 10/12/2021 INDICATION: 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 Request for IR to perform diagnostic and therapeutic thoracentesis for right-sided pleural effusion. EXAM: ULTRASOUND GUIDED diagnostic and therapeutic THORACENTESIS MEDICATIONS: 71ml 1% lidocaine COMPLICATIONS: None immediate. PROCEDURE: An ultrasound guided thoracentesis was thoroughly discussed with the patient and questions answered. The benefits, risks, alternatives and complications were also discussed. The patient understands and wishes to proceed with the procedure. Written consent was obtained. Ultrasound was performed to localize and mark an adequate pocket of fluid in the right chest. The area was then prepped and draped in the normal sterile fashion. 1% Lidocaine was used for local anesthesia. Under ultrasound guidance a 6 Fr Safe-T-Centesis catheter was introduced. Thoracentesis was performed. The catheter was removed and a dressing applied. FINDINGS: A total of approximately 1.2 L of hazy, amber fluid was removed. Samples were sent to the laboratory as requested by the clinical  team. IMPRESSION: Successful ultrasound guided right thoracentesis yielding 1.2 L of pleural fluid. Read by: Narda Rutherford, NP Electronically Signed   By: Albin Felling M.D.   On: 10/12/2021 12:27     ASSESSMENT/PLAN:  This is a very pleasant 70 year old Caucasian male diagnosed with extensive stage (T4, N3, M1C) small cell lung cancer presented with large right upper lobe lung mass in addition to mediastinal and right supraclavicular lymphadenopathy and multiple metastatic liver lesions diagnosed in April 2021.   The patient initially underwent systemic chemotherapy with carboplatin for an AUC of 5 on day 1, etoposide 100 mg per metered squared on days 1, 2, and 3 in addition to [facility] for myeloprotection.  He also received immunotherapy with Imfinzi on day 1 of every chemotherapy cycle.  He is status post 9 cycles.  Starting from cycle #5 he was on single agent immunotherapy with Imfinzi IV every 4 weeks.   The patient had evidence of disease progression on his scan from December 2021.  He was then started on systemic chemotherapy with carboplatin for an AUC of 5 on day 1, etoposide 100 mg per metered squared on days 1, 2, and 3 in addition to Tecentriq 1200 mg on day 1, the patient is status post 8 cycles.  Starting from cycle #5, the patient started maintenance immunotherapy with Tecentriq.  This was discontinued due to evidence of disease progression.  The patient is currently undergoing palliative systemic chemotherapy with Zepzelca IV every 3 weeks.  He is status post his 7 cycles and he tolerated it well except for fatigue a few days after treatment.    I reviewed the patient's symptoms and question about pleuex with Dr. Julien Nordmann. Per chart review, it looks like he is requiring thoracentesis every 7-8 weeks or so. Not sure if he is a candidate or not but we will refer him to pulmonology for consideration of pleurx. The patient is concerned that he will end up in the hospital again if unable to  stay on top of his symptoms. I have placed the referral. I did not appreciate significant effusion on exam today, just some decreased breath sounds at the base of the right lung. His oxygen was 96% on room air. I offered him a chest x-ray to evaluate the fluid but he does not feel that he is at the point of requiring a thoracentesis at this time. He is going to monitor his symptoms and call us if he has worsening shortness of breath, coughing, decreased oxygen saturation, and/or right sided chest heaviness. Labs were reviewed. Recommend that he proceed with cycle #8 today as scheduled.   We  will see him back for a follow up visit in 3 weeks for evaluation before starting cycle #9.   He will continue to use Tussionex for his cough.   The patient was advised to call immediately if he has any concerning symptoms in the interval. The patient voices understanding of current disease status and treatment options and is in agreement with the current care plan. All questions were answered. The patient knows to call the clinic with any problems, questions or concerns. We can certainly see the patient much sooner if necessary       Orders Placed This Encounter  Procedures   Ambulatory referral to Pulmonology    Referral Priority:   Routine    Referral Type:   Consultation    Referral Reason:   Specialty Services Required    Requested Specialty:   Pulmonary Disease    Number of Visits Requested:   1      The total time spent in the appointment was 20-29 minutes.   Lorenzo Pereyra L Kahron Kauth, PA-C 10/29/21

## 2021-10-28 MED FILL — Dexamethasone Sodium Phosphate Inj 100 MG/10ML: INTRAMUSCULAR | Qty: 1 | Status: AC

## 2021-10-29 ENCOUNTER — Inpatient Hospital Stay: Payer: Medicare Other

## 2021-10-29 ENCOUNTER — Other Ambulatory Visit: Payer: Self-pay

## 2021-10-29 ENCOUNTER — Inpatient Hospital Stay (HOSPITAL_BASED_OUTPATIENT_CLINIC_OR_DEPARTMENT_OTHER): Payer: Medicare Other | Admitting: Physician Assistant

## 2021-10-29 ENCOUNTER — Inpatient Hospital Stay: Payer: Medicare Other | Attending: Internal Medicine

## 2021-10-29 VITALS — BP 130/84 | HR 100 | Temp 99.0°F | Resp 24 | Wt 286.8 lb

## 2021-10-29 DIAGNOSIS — J90 Pleural effusion, not elsewhere classified: Secondary | ICD-10-CM | POA: Diagnosis not present

## 2021-10-29 DIAGNOSIS — Z95828 Presence of other vascular implants and grafts: Secondary | ICD-10-CM

## 2021-10-29 DIAGNOSIS — R059 Cough, unspecified: Secondary | ICD-10-CM | POA: Insufficient documentation

## 2021-10-29 DIAGNOSIS — C787 Secondary malignant neoplasm of liver and intrahepatic bile duct: Secondary | ICD-10-CM | POA: Insufficient documentation

## 2021-10-29 DIAGNOSIS — Z5111 Encounter for antineoplastic chemotherapy: Secondary | ICD-10-CM

## 2021-10-29 DIAGNOSIS — C3411 Malignant neoplasm of upper lobe, right bronchus or lung: Secondary | ICD-10-CM | POA: Diagnosis not present

## 2021-10-29 LAB — CBC WITH DIFFERENTIAL (CANCER CENTER ONLY)
Abs Immature Granulocytes: 0.03 10*3/uL (ref 0.00–0.07)
Basophils Absolute: 0.1 10*3/uL (ref 0.0–0.1)
Basophils Relative: 1 %
Eosinophils Absolute: 0.1 10*3/uL (ref 0.0–0.5)
Eosinophils Relative: 2 %
HCT: 37.2 % — ABNORMAL LOW (ref 39.0–52.0)
Hemoglobin: 12.3 g/dL — ABNORMAL LOW (ref 13.0–17.0)
Immature Granulocytes: 1 %
Lymphocytes Relative: 14 %
Lymphs Abs: 0.6 10*3/uL — ABNORMAL LOW (ref 0.7–4.0)
MCH: 30.3 pg (ref 26.0–34.0)
MCHC: 33.1 g/dL (ref 30.0–36.0)
MCV: 91.6 fL (ref 80.0–100.0)
Monocytes Absolute: 0.6 10*3/uL (ref 0.1–1.0)
Monocytes Relative: 12 %
Neutro Abs: 3.3 10*3/uL (ref 1.7–7.7)
Neutrophils Relative %: 70 %
Platelet Count: 232 10*3/uL (ref 150–400)
RBC: 4.06 MIL/uL — ABNORMAL LOW (ref 4.22–5.81)
RDW: 15 % (ref 11.5–15.5)
WBC Count: 4.7 10*3/uL (ref 4.0–10.5)
nRBC: 0 % (ref 0.0–0.2)

## 2021-10-29 LAB — CMP (CANCER CENTER ONLY)
ALT: 20 U/L (ref 0–44)
AST: 18 U/L (ref 15–41)
Albumin: 3.7 g/dL (ref 3.5–5.0)
Alkaline Phosphatase: 65 U/L (ref 38–126)
Anion gap: 9 (ref 5–15)
BUN: 16 mg/dL (ref 8–23)
CO2: 26 mmol/L (ref 22–32)
Calcium: 9.2 mg/dL (ref 8.9–10.3)
Chloride: 105 mmol/L (ref 98–111)
Creatinine: 1.29 mg/dL — ABNORMAL HIGH (ref 0.61–1.24)
GFR, Estimated: 59 mL/min — ABNORMAL LOW (ref >60)
Glucose, Bld: 142 mg/dL — ABNORMAL HIGH (ref 70–99)
Potassium: 4.4 mmol/L (ref 3.5–5.1)
Sodium: 140 mmol/L (ref 135–145)
Total Bilirubin: 0.3 mg/dL (ref 0.3–1.2)
Total Protein: 6.3 g/dL — ABNORMAL LOW (ref 6.5–8.1)

## 2021-10-29 MED ORDER — SODIUM CHLORIDE 0.9% FLUSH
10.0000 mL | INTRAVENOUS | Status: DC | PRN
  Administered 2021-10-29: 10 mL

## 2021-10-29 MED ORDER — SODIUM CHLORIDE 0.9 % IV SOLN: Freq: Once | INTRAVENOUS | Status: AC

## 2021-10-29 MED ORDER — SODIUM CHLORIDE 0.9 % IV SOLN
10.0000 mg | Freq: Once | INTRAVENOUS | Status: AC
  Administered 2021-10-29: 10 mg via INTRAVENOUS
  Filled 2021-10-29: qty 10

## 2021-10-29 MED ORDER — HEPARIN SOD (PORK) LOCK FLUSH 100 UNIT/ML IV SOLN
500.0000 [IU] | Freq: Once | INTRAVENOUS | Status: AC | PRN
  Administered 2021-10-29: 500 [IU]

## 2021-10-29 MED ORDER — SODIUM CHLORIDE 0.9 % IV SOLN
3.1300 mg/m2 | Freq: Once | INTRAVENOUS | Status: AC
  Administered 2021-10-29: 8 mg via INTRAVENOUS
  Filled 2021-10-29: qty 16

## 2021-10-29 MED ORDER — SODIUM CHLORIDE 0.9% FLUSH
10.0000 mL | Freq: Once | INTRAVENOUS | Status: AC
  Administered 2021-10-29: 10 mL

## 2021-10-29 MED ORDER — PALONOSETRON HCL INJECTION 0.25 MG/5ML
0.2500 mg | Freq: Once | INTRAVENOUS | Status: AC
  Administered 2021-10-29: 0.25 mg via INTRAVENOUS
  Filled 2021-10-29: qty 5

## 2021-10-29 NOTE — Patient Instructions (Signed)
[facility]  Discharge Instructions: Thank you for choosing [facility] to provide your oncology and hematology care.   If you have a lab appointment with the [facility], please go directly to the [facility] and check in at the registration area.   Wear comfortable clothing and clothing appropriate for easy access to any Portacath or PICC line.   We strive to give you quality time with your provider. You may need to reschedule your appointment if you arrive late (15 or more minutes).  Arriving late affects you and other patients whose appointments are after yours.  Also, if you miss three or more appointments without notifying the office, you may be dismissed from the clinic at the provider's discretion.      For prescription refill requests, have your pharmacy contact our office and allow 72 hours for refills to be completed.    Today you received the following chemotherapy and/or immunotherapy agent: Lurbenectedin (Zepzelca).   To help prevent nausea and vomiting after your treatment, we encourage you to take your nausea medication as directed.  BELOW ARE SYMPTOMS THAT SHOULD BE REPORTED IMMEDIATELY: *FEVER GREATER THAN 100.4 F (38 C) OR HIGHER *CHILLS OR SWEATING *NAUSEA AND VOMITING THAT IS NOT CONTROLLED WITH YOUR NAUSEA MEDICATION *UNUSUAL SHORTNESS OF BREATH *UNUSUAL BRUISING OR BLEEDING *URINARY PROBLEMS (pain or burning when urinating, or frequent urination) *BOWEL PROBLEMS (unusual diarrhea, constipation, pain near the anus) TENDERNESS IN MOUTH AND THROAT WITH OR WITHOUT PRESENCE OF ULCERS (sore throat, sores in mouth, or a toothache) UNUSUAL RASH, SWELLING OR PAIN  UNUSUAL VAGINAL DISCHARGE OR ITCHING   Items with * indicate a potential emergency and should be followed up as soon as possible or go to the Emergency Department if any problems should occur.  Please show the CHEMOTHERAPY ALERT CARD or IMMUNOTHERAPY ALERT CARD at  check-in to the Emergency Department and triage nurse.  Should you have questions after your visit or need to cancel or reschedule your appointment, please contact [facility]  Dept: [phone]  and follow the prompts.  Office hours are 8:00 a.m. to 4:30 p.m. Monday - Friday. Please note that voicemails left after 4:00 p.m. may not be returned until the following business day.  We are closed weekends and major holidays. You have access to a nurse at all times for urgent questions. Please call the main number to the clinic Dept: [phone] and follow the prompts.   For any non-urgent questions, you may also contact your provider using MyChart. We now offer e-Visits for anyone 71 and older to request care online for non-urgent symptoms. For details visit [email or web address].   Also download the MyChart app! Go to the app store, search "MyChart", open the app, select [facility], and log in with your MyChart username and password.  Due to Covid, a mask is required upon entering the hospital/clinic. If you do not have a mask, one will be given to you upon arrival. For doctor visits, patients may have 1 support person aged 52 or older with them. For treatment visits, patients cannot have anyone with them due to current Covid guidelines and our immunocompromised population.

## 2021-11-13 ENCOUNTER — Encounter: Payer: Self-pay | Admitting: Cardiology

## 2021-11-13 ENCOUNTER — Ambulatory Visit (INDEPENDENT_AMBULATORY_CARE_PROVIDER_SITE_OTHER): Payer: Medicare Other | Admitting: Cardiology

## 2021-11-13 ENCOUNTER — Other Ambulatory Visit: Payer: Self-pay

## 2021-11-13 VITALS — BP 138/80 | HR 101 | Ht 69.0 in | Wt 291.5 lb

## 2021-11-13 DIAGNOSIS — J441 Chronic obstructive pulmonary disease with (acute) exacerbation: Secondary | ICD-10-CM | POA: Diagnosis not present

## 2021-11-13 DIAGNOSIS — R0789 Other chest pain: Secondary | ICD-10-CM

## 2021-11-13 DIAGNOSIS — I2510 Atherosclerotic heart disease of native coronary artery without angina pectoris: Secondary | ICD-10-CM | POA: Diagnosis not present

## 2021-11-13 DIAGNOSIS — I10 Essential (primary) hypertension: Secondary | ICD-10-CM | POA: Diagnosis not present

## 2021-11-13 DIAGNOSIS — C3411 Malignant neoplasm of upper lobe, right bronchus or lung: Secondary | ICD-10-CM | POA: Diagnosis not present

## 2021-11-13 DIAGNOSIS — R0609 Other forms of dyspnea: Secondary | ICD-10-CM | POA: Diagnosis not present

## 2021-11-13 DIAGNOSIS — E785 Hyperlipidemia, unspecified: Secondary | ICD-10-CM

## 2021-11-13 DIAGNOSIS — E1169 Type 2 diabetes mellitus with other specified complication: Secondary | ICD-10-CM | POA: Diagnosis not present

## 2021-11-13 DIAGNOSIS — R931 Abnormal findings on diagnostic imaging of heart and coronary circulation: Secondary | ICD-10-CM | POA: Insufficient documentation

## 2021-11-13 MED ORDER — BISOPROLOL FUMARATE 5 MG PO TABS: 5.0000 mg | ORAL_TABLET | Freq: Every day | ORAL | 6 refills | Status: DC

## 2021-11-13 MED ORDER — METOPROLOL TARTRATE 100 MG PO TABS: ORAL_TABLET | ORAL | 0 refills | Status: DC

## 2021-11-13 MED ORDER — IVABRADINE HCL 7.5 MG PO TABS: ORAL_TABLET | ORAL | 0 refills | Status: DC

## 2021-11-13 NOTE — Patient Instructions (Addendum)
Medication Instructions:  - Your physician has recommended you make the following change in your medication:   1) START Bisoprolol 5 mg: - take 1 tablet by mouth once daily  *If you need a refill on your cardiac medications before your next appointment, please call your pharmacy*   Lab Work: - none ordered  If you have labs (blood work) drawn today and your tests are completely normal, you will receive your results only by: [place] (if you have MyChart) OR A paper copy in the mail If you have any lab test that is abnormal or we need to change your treatment, we will call you to review the results.   Testing/Procedures:  1) Cardiac CT: - Your physician has requested that you have cardiac CT. Cardiac computed tomography (CT) is a painless test that uses an x-ray machine to take clear, detailed pictures of your heart.     Your cardiac CT will be scheduled at:   [facility] [address] [place], [place] [address] [phone]  If scheduled at [facility], please arrive at the [facility] main entrance (entrance A) of [facility] 30 minutes prior to test start time. You can use the FREE valet parking offered at the main entrance (encouraged to control the heart rate for the test) Proceed to the [facility] Radiology Department (first floor) to check-in and test prep.   Please follow these instructions carefully (unless otherwise directed):  Hold all erectile dysfunction medications at least 3 days (72 hrs) prior to test.  On the Night Before the Test: Be sure to Drink plenty of water. Do not consume any caffeinated/decaffeinated beverages or chocolate 12 hours prior to your test. Do not take any antihistamines 12 hours prior to your test.   On the Day of the Test: Drink plenty of water until 1 hour prior to the test. Do not eat any food 4 hours prior to the test. You may take your regular medications prior to the test except for  bisoprolol (hold this the morning of your test) .  2 hours prior to your Cardiac CT: - Take metoprolol tartrate 100 mg x 1 dose & - Take ivabradine 15 mg (total) x 1 dose        After the Test: Drink plenty of water. After receiving IV contrast, you may experience a mild flushed feeling. This is normal. On occasion, you may experience a mild rash up to 24 hours after the test. This is not dangerous. If this occurs, you can take Benadryl 25 mg and increase your fluid intake. If you experience trouble breathing, this can be serious. If it is severe call 911 IMMEDIATELY. If it is mild, please call our office. If you take any of these medications: Glipizide/Metformin, Avandament, Glucavance, please do not take 48 hours after completing test unless otherwise instructed.  Please allow 2-4 weeks for scheduling of routine cardiac CTs. Some insurance companies require a pre-authorization which may delay scheduling of this test.   For non-scheduling related questions, please contact the cardiac imaging nurse navigator should you have any questions/concerns: Marchia Bond, Cardiac Imaging Nurse Navigator Gordy Clement, Cardiac Imaging Nurse Navigator [facility] Direct Office Dial: [phone]   For scheduling needs, including cancellations and rescheduling, please call [place], [phone].    Follow-Up: At [facility], you and your health needs are our priority.  As part of our continuing mission to provide you with exceptional heart care, we have created  designated Provider Care Teams.  These Care Teams include your primary Cardiologist (physician) and Advanced Practice Providers (APPs -  Physician Assistants and Nurse Practitioners) who all work together to provide you with the care you need, when you need it.  We recommend signing up for the patient portal called "MyChart".  Sign up information is provided on this After Visit Summary.  MyChart is used to  connect with patients for Virtual Visits (Telemedicine).  Patients are able to view lab/test results, encounter notes, upcoming appointments, etc.  Non-urgent messages can be sent to your provider as well.   To learn more about what you can do with MyChart, go to [email or web address].    Your next appointment:   4-5 week(s)/ after testing is completed  The format for your next appointment:   In Person  Provider:   Glenetta Hew, MD    Other Instructions   Bisoprolol Tablets What is this medication? BISOPROLOL (bis OH proe lol) treats high blood pressure. It works by lowering your blood pressure and heart rate, making it easier for your heart to pump blood to the rest of your body. It belongs to a group of medications called beta blockers. This medicine may be used for other purposes; ask your health care provider or pharmacist if you have questions. COMMON BRAND NAME(S): Zebeta What should I tell my care team before I take this medication? They need to know if you have any of these conditions: Chest pain Diabetes Heart or vessel disease like slow heart rate, worsening heart failure, heart block, sick sinus syndrome, or Raynaud's disease Kidney disease Liver disease Lung or breathing disease, like asthma or emphysema Pheochromocytoma Thyroid disease An unusual or allergic reaction to bisoprolol, other beta blockers, medications, foods, dyes, or preservatives Pregnant or trying to get pregnant Breast-feeding How should I use this medication? Take this medication by mouth. Take it as directed on the prescription label at the same time every day. You can take it with or without food. If it upsets your stomach, take it with food. Keep taking it unless your care team tells you to stop. Talk to your care team about the use of this medication in children. Special care may be needed. Overdosage: If you think you have taken too much of this medicine contact a poison control center or  emergency room at once. NOTE: This medicine is only for you. Do not share this medicine with others. What if I miss a dose? If you miss a dose, take it as soon as you can. If it is almost time for your next dose, take only that dose. Do not take double or extra doses. What may interact with this medication? This medication may interact with the following: Certain medications for blood pressure, heart disease, irregular heartbeat NSAIDs, medications for pain and inflammation, like ibuprofen or naproxen Rifampin This list may not describe all possible interactions. Give your health care provider a list of all the medicines, herbs, non-prescription drugs, or dietary supplements you use. Also tell them if you smoke, drink alcohol, or use illegal drugs. Some items may interact with your medicine. What should I watch for while using this medication? Visit your care team for regular checks on your progress. Check your blood pressure as directed. Ask your care team what your blood pressure should be. Also, find out when you should contact them. Do not treat yourself for coughs, colds, or pain while you are using this medication without asking your care team for advice. Some  medications may increase your blood pressure. You may get drowsy or dizzy. Do not drive, use machinery, or do anything that needs mental alertness until you know how this medication affects you. Do not stand up or sit up quickly, especially if you are an older patient. This reduces the risk of dizzy or fainting spells. Alcohol may interfere with the effect of this medication. Avoid alcoholic drinks. This medication may increase blood sugar. Ask your care team if changes in diet or medications are needed if you have diabetes. What side effects may I notice from receiving this medication? Side effects that you should report to your care team as soon as possible: Allergic reactions--skin rash, itching, hives, swelling of the face, lips,  tongue, or throat Heart failure--shortness of breath, swelling of the ankles, feet, or hands, sudden weight gain, unusual weakness or fatigue Low blood pressure--dizziness, feeling faint or lightheaded, blurry vision Raynaud's--cool, numb, or painful fingers or toes that may change color from pale, to blue, to red Slow heartbeat--dizziness, feeling faint or lightheaded, confusion, trouble breathing, unusual weakness or fatigue Worsening mood, feelings of depression Side effects that usually do not require medical attention (report to your care team if they continue or are bothersome): Change in sex drive or performance Diarrhea Dizziness Fatigue Headache This list may not describe all possible side effects. Call your doctor for medical advice about side effects. You may report side effects to FDA at 1-800-FDA-1088. Where should I keep my medication? Keep out of the reach of children and pets. Store at room temperature between 20 and 25 degrees C (68 and 77 degrees F). Protect from light and moisture. Keep the container tightly closed. Throw away any unused medication after the expiration date. NOTE: This sheet is a summary. It may not cover all possible information. If you have questions about this medicine, talk to your doctor, pharmacist, or health care provider.  2022 Elsevier/Gold Standard (2021-08-20 00:00:00)  Cardiac CT Angiogram A cardiac CT angiogram is a procedure to look at the heart and the area around the heart. It may be done to help find the cause of chest pains or other symptoms of heart disease. During this procedure, a substance called contrast dye is injected into the blood vessels in the area to be checked. A large X-ray machine, called a CT scanner, then takes detailed pictures of the heart and the surrounding area. The procedure is also sometimes called a coronary CT angiogram, coronary artery scanning, or CTA. A cardiac CT angiogram allows the health care provider to see  how well blood is flowing to and from the heart. The health care provider will be able to see if there are any problems, such as: Blockage or narrowing of the coronary arteries in the heart. Fluid around the heart. Signs of weakness or disease in the muscles, valves, and tissues of the heart. Tell a health care provider about: Any allergies you have. This is especially important if you have had a previous allergic reaction to contrast dye. All medicines you are taking, including vitamins, herbs, eye drops, creams, and over-the-counter medicines. Any blood disorders you have. Any surgeries you have had. Any medical conditions you have. Whether you are pregnant or may be pregnant. Any anxiety disorders, chronic pain, or other conditions you have that may increase your stress or prevent you from lying still. What are the risks? Generally, this is a safe procedure. However, problems may occur, including: Bleeding. Infection. Allergic reactions to medicines or dyes. Damage to other  structures or organs. Kidney damage from the contrast dye that is used. Increased risk of cancer from radiation exposure. This risk is low. Talk with your health care provider about: The risks and benefits of testing. How you can receive the lowest dose of radiation. What happens before the procedure? Wear comfortable clothing and remove any jewelry, glasses, dentures, and hearing aids. Follow instructions from your health care provider about eating and drinking. This may include: For 12 hours before the procedure -- avoid caffeine. This includes tea, coffee, soda, energy drinks, and diet pills. Drink plenty of water or other fluids that do not have caffeine in them. Being well hydrated can prevent complications. For 4-6 hours before the procedure -- stop eating and drinking. The contrast dye can cause nausea, but this is less likely if your stomach is empty. Ask your health care provider about changing or stopping  your regular medicines. This is especially important if you are taking diabetes medicines, blood thinners, or medicines to treat problems with erections (erectile dysfunction). What happens during the procedure?  Hair on your chest may need to be removed so that small sticky patches called electrodes can be placed on your chest. These will transmit information that helps to monitor your heart during the procedure. An IV will be inserted into one of your veins. You might be given a medicine to control your heart rate during the procedure. This will help to ensure that good images are obtained. You will be asked to lie on an exam table. This table will slide in and out of the CT machine during the procedure. Contrast dye will be injected into the IV. You might feel warm, or you may get a metallic taste in your mouth. You will be given a medicine called nitroglycerin. This will relax or dilate the arteries in your heart. The table that you are lying on will move into the CT machine tunnel for the scan. The person running the machine will give you instructions while the scans are being done. You may be asked to: Keep your arms above your head. Hold your breath. Stay very still, even if the table is moving. When the scanning is complete, you will be moved out of the machine. The IV will be removed. The procedure may vary among health care providers and hospitals. What can I expect after the procedure? After your procedure, it is common to have: A metallic taste in your mouth from the contrast dye. A feeling of warmth. A headache from the nitroglycerin. Follow these instructions at home: Take over-the-counter and prescription medicines only as told by your health care provider. If you are told, drink enough fluid to keep your urine pale yellow. This will help to flush the contrast dye out of your body. Most people can return to their normal activities right after the procedure. Ask your health  care provider what activities are safe for you. It is up to you to get the results of your procedure. Ask your health care provider, or the department that is doing the procedure, when your results will be ready. Keep all follow-up visits as told by your health care provider. This is important. Contact a health care provider if: You have any symptoms of allergy to the contrast dye. These include: Shortness of breath. Rash or hives. A racing heartbeat. Summary A cardiac CT angiogram is a procedure to look at the heart and the area around the heart. It may be done to help find the cause of  chest pains or other symptoms of heart disease. During this procedure, a large X-ray machine, called a CT scanner, takes detailed pictures of the heart and the surrounding area after a contrast dye has been injected into blood vessels in the area. Ask your health care provider about changing or stopping your regular medicines before the procedure. This is especially important if you are taking diabetes medicines, blood thinners, or medicines to treat erectile dysfunction. If you are told, drink enough fluid to keep your urine pale yellow. This will help to flush the contrast dye out of your body. This information is not intended to replace advice given to you by your health care provider. Make sure you discuss any questions you have with your health care provider. Document Revised: 08/14/2021 Document Reviewed: 07/27/2019 Elsevier Patient Education  2022 [employer].

## 2021-11-13 NOTE — Progress Notes (Signed)
Primary Care Provider: Doreene Nest, NP [facility] Cardiologist: None Electrophysiologist: None Oncologist: Si Gaul, MD  Clinic Note: Chief Complaint  Patient presents with   New Patient (Initial Visit)    Sob c/o edema. Meds reviewed verbally with pt.   Shortness of Breath    Progressive shortness of breath   Coronary Artery Disease    Coronary calcification on CT    ===================================  ASSESSMENT/PLAN   Problem List Items Addressed This Visit       Cardiology Problems   Coronary artery calcification seen on CAT scan (Chronic)    Progressive coronary calcification noted on CT scan.  He has progressive dyspnea but is related to his underlying lung disease and cancer, however need to exclude ischemic CAD.  Preferable option for ischemic evaluation would be Coronary/Cardiac CTA.  Less favorable option Lexiscan Myoview.      Relevant Medications   bisoprolol (ZEBETA) 5 MG tablet   metoprolol tartrate (LOPRESSOR) 100 MG tablet   ivabradine (CORLANOR) 7.5 MG TABS tablet   Other Relevant Orders   EKG 12-Lead (Completed)   CT CORONARY MORPH W/CTA COR W/SCORE W/CA W/CM &/OR WO/CM   Essential hypertension (Chronic)    Borderline blood pressure today.  He is on amlodipine 10 mg and losartan 50 mg daily.  He remains somewhat tachycardic.  In the interest of the potentially considering a cardiac etiology, I would like to try to slow his heart rate down.  Plan: Start back nonselective beta-blocker: Bisoprolol 5 mg daily.      Relevant Medications   bisoprolol (ZEBETA) 5 MG tablet   metoprolol tartrate (LOPRESSOR) 100 MG tablet   ivabradine (CORLANOR) 7.5 MG TABS tablet   Other Relevant Orders   EKG 12-Lead (Completed)   Hyperlipidemia associated with type 2 diabetes mellitus (HCC) - Primary (Chronic)    Lipid panel is pretty good as of August.  Triglycerides are little elevated, but LDL is 66 on current dose of pravastatin.  He tends to go  up and down based on his most recent reports.  For now, we will continue on pravastatin as we would plan to target LDL less than 70 if at all possible just to minimize risk.      Relevant Medications   bisoprolol (ZEBETA) 5 MG tablet   metoprolol tartrate (LOPRESSOR) 100 MG tablet   ivabradine (CORLANOR) 7.5 MG TABS tablet   Other Relevant Orders   EKG 12-Lead (Completed)     Other   Atypical chest pain    He has some strange chest discomfort spells may have some discomfort if he overexerts.  It is possible that this could be a sign of a angina versus potentially still cancer related pain.  In order to exclude ischemic CAD, we have chosen to proceed with attempted Cardiac CT Angiogram.    (Less favorable l alternative would be Lexiscan Myoview -> I did discuss the risk, benefits alternatives indications of Myoview just in case this option has to be taken.)  Shared Decision Making/Informed Consent The risks [chest pain, shortness of breath, cardiac arrhythmias, dizziness, blood pressure fluctuations, myocardial infarction, stroke/transient ischemic attack, nausea, vomiting, allergic reaction, radiation exposure, metallic taste sensation and life-threatening complications (estimated to be 1 in 10,000)], benefits (risk stratification, diagnosing coronary artery disease, treatment guidance) and alternatives of a nuclear stress test were discussed in detail with Mr. Boike and he agrees to proceed.      Relevant Orders   EKG 12-Lead (Completed)   CT CORONARY MORPH  W/CTA COR W/SCORE W/CA W/CM &/OR WO/CM   Exertional dyspnea (Chronic)    Profound exertional dyspnea.  He does not seem to bouncing back as quickly as he had in the past after thoracenteses.  With him having three-vessel disease and risk factors of diabetes, hypertension hyperlipidemia as well as obesity as a gentleman at 70 years old, not unreasonable to exclude ischemic etiology in order to not miss a potential treatable  option.  Given his body habitus and three-vessel calcification, I think a Cardiac CT Angiogram would be more effective for a more definitive diagnostic technique.  He does get routine CT scans done by oncology and it would be nice to potentially dovetail these to procedures together.  I will notify of my CT coordinator to see if this could be done.  It would require some coordination with Dr. Julien Nordmann.  1 concern however would be the level of tachycardia.  I am starting him on bisoprolol now, and he will probably need combination of high-dose metoprolol plus or minus ivabradine to slow steroid enough to get rate control to perform CT scan.  With this as a possible issue, I do think performing the CT scan at one of the [facility] health sites in [place] with the newer machine would be a better option.  He just had a 2D echocardiogram revealing pretty normal EF and diastolic function.  Plan: Will attempt coronary CTA, if not successful, would have to convert to [facility] (as a less favorable alternative).      Relevant Orders   EKG 12-Lead (Completed)   Small cell lung cancer, right upper lobe (HCC) (Chronic)    I truly believe this is probably the main cause for his dyspnea and recurrent pleural effusions.  Nothing from 2D echocardiogram would suggest any reason from a cardiac standpoint for him to have pleural effusion.  EF normal, diastolic function relatively normal as well.  Also above potential pulmonary hypertension standpoint, VRP size and pressures appear to be normal.      COPD with exacerbation (HCC) (Chronic)    ===================================  HPI:    Angelino Rumery is a 70 y.o. 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  Recent Hospitalizations:  10/28-30/2022 [facility]: Acute on chronic respiratory failure-COPD exacerbation with possible pneumonia and right-sided pleural effusion; SaO2 79% Treated with antibiotics, right side thoracentesis.  Maxwel Meadowcroft was last seen on 10/18/2021 by Alma Friendly, NP for hospital follow-up.  Was feeling little better but still having wheezing and exertional dyspnea with minimal activity.  Completed antibiotic course of azithromycin and ceftriaxone.  Noted cough dyspnea and wheezing.  Most recent clinic visit with oncology was 10/29/2021.  Indicated he was feeling better since thoracentesis but it was able to tell when he is reaccumulating effusion fluid.  Consider referral to pulmonary medicine for Pleurx catheter because of need for recurrent thoracentesis.  Has had 3 rounds of Chemo & XRT.  Reviewed  CV studies:    The following studies were reviewed today: (if available, images/films reviewed: From Epic Chart or Care Everywhere) TTE 05/16/2020: EF 55 to 60%.  No R  WMA.  Mild LVH.  Indeterminate LVEDP.  Unable to assess RVP.  Normal aortic and mitral valves.  Mildly elevated RAP. TTE 10/12/2021: EF 50 to 55%.  No R WMA.  Mild LVH.  Indeterminate LV P.  Mild LA dilation.  Normal RV.  Normal RAP.  Normal aortic and mitral valves. CT Angio Chest-PE: No PE.  Stable heart size.  Mild atherosclerotic plaque of thoracic aorta.  Patent trachea and main airways with moderate narrowing of the right main bronchus.  Unchanged masslike consolidative opacity in the medial right upper lobe abutting and extending.  Focus of nodular opacity in the right apex superior & lateral to the dominant consolidation.  Patchy groundglass opacity and interlobular septal thickening predominantly left lung and patchy nodular opacities in the left apex which is  new.  Partial collapse of right lower lobe since 10/04/2021 moderate right pleural effusion. CT Chest 10/04/2021: Three-vessel coronary calcification noted.   Interval History:   Peng Thorstenson presents here today for cardiology valuation of now what seems like progressive dyspnea.  He usually has significant improvement in dyspnea after thoracentesis, but after the last 2 different procedures, he does not have the same bounce back.  He continues to be symptomatic from dyspnea standpoint with limiting activity.  He is very concerned as is his pulmonary/oncology team that there could be some cardiac component contributing to his progressive dyspnea.  He does have significant coronary calcification noted on CT scan and has risk factors of diabetes, hypertension hyperlipidemia is poorly controlled.  He is also former smoker.  He denies any chest tightness or pressure pain with rest or exertion, but has profound exertional dyspnea.  He does have some mild baseline resting dyspnea but definitely worse with exertion.  If he exerts himself beyond some dyspnea-(carrying something up steps, he will probably notice a little tightness in his chest) he thought that this was related simply lung disease but now is concerned that it could be cardiac.  He has some mild end of day edema but nothing significant; no real PND or orthopnea.  His heart rate is relatively fast at baseline and tends to get up even faster with activity.  He does get little lightheadedness.  He denies any syncope but does get some near syncopal lightheadedness if overexerts himself.  Also with bending over and standing back up again.  He denies any rapid irregular heartbeats or prolonged palpitations.  Simply notes his heart rate going fast.  CV Review of Symptoms (Summary) Cardiovascular ROS: positive for - dyspnea on exertion, rapid heart rate, shortness of breath, and lightheadedness and dizziness with some near syncope negative for - edema,  irregular heartbeat, loss of consciousness, orthopnea, paroxysmal nocturnal dyspnea, or TIA/MRCP got some claudication  REVIEWED OF SYSTEMS   Review of Systems  Constitutional:  Positive for malaise/fatigue. Negative for weight loss.  HENT:  Positive for congestion. Negative for nosebleeds.   Respiratory:  Positive for cough, shortness of breath and wheezing.   Gastrointestinal:  Negative for blood in stool and melena.  Genitourinary:  Negative for hematuria.  Musculoskeletal:  Negative for falls.  Neurological:  Positive for dizziness. Negative for focal weakness.  Psychiatric/Behavioral:  Negative for depression and memory loss. The patient is nervous/anxious and has insomnia.    I have reviewed and (if needed) personally updated the patient's problem list, medications, allergies, past medical and surgical history, social and family history.   PAST MEDICAL HISTORY   Past Medical History:  Diagnosis Date   Chickenpox  Chronic knee pain    Chronic low back pain    Coronary artery calcification seen on CAT scan    CT Chest 10/04/2021: Three-vessel coronary calcification noted.   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 He has disease progression after the first line of chemotherapy in December 2021.   Testosterone deficiency    Type 2 diabetes mellitus ([place])     PAST SURGICAL HISTORY   Past Surgical History:  Procedure Laterality Date   COLONOSCOPY WITH PROPOFOL N/A 12/17/2018   Procedure: COLONOSCOPY WITH PROPOFOL;  Surgeon: Virgel Manifold, MD;  Location: [facility] ENDOSCOPY;  Service: Gastroenterology;  Laterality: N/A;   IR IMAGING  GUIDED PORT INSERTION  04/17/2020   JOINT REPLACEMENT Bilateral    REPLACEMENT TOTAL KNEE BILATERAL  2015   TONSILLECTOMY  1960   TRANSTHORACIC ECHOCARDIOGRAM  05/2020   a) 05/2020: EF 55 to 60%.  No R WMA.  Mild LVH.  Indeterminate LVEDP.  Unable to assess RVP.  Normal aortic and mitral valves.  Mildly elevated RAP.; b) 09/2021: EF 50-55%. No RWMA. Mild LVH. ~ LVEDP. Mild LA Dilation. NORMAL RV/RAP.  Normal MV/AoV.    Immunization History  Administered Date(s) Administered   Fluad Quad(high Dose 65+) 10/26/2020, 08/13/2021   Influenza Inj Mdck Quad Pf 09/18/2017   Influenza,inj,Quad PF,6+ Mos 10/27/2018, 11/08/2019   Influenza-Unspecified 09/18/2017   PFIZER(Purple Top)SARS-COV-2 Vaccination 03/12/2020, 04/02/2020, 12/11/2020   Pneumococcal Conjugate-13 08/17/2015   Pneumococcal Polysaccharide-23 11/25/2012, 10/27/2018   Td 08/27/2016    MEDICATIONS/ALLERGIES   Current Meds  Medication Sig   albuterol (VENTOLIN HFA) 108 (90 Base) MCG/ACT inhaler Inhale 2 puffs into the lungs every 6 (six) hours as needed for wheezing or shortness of breath.   amLODipine (NORVASC) 10 MG tablet TAKE 1 TABLET BY MOUTH EVERY DAY for blood pressure. (Patient taking differently: Take 10 mg by mouth daily.)   B-D 3CC LUER-LOK SYR 22GX1" 22G X 1" 3 ML MISC USE AS INSTRUCTED FOR TESTOSTERONE INJECTION EVERY 2 WEEKS   bisoprolol (ZEBETA) 5 MG tablet Take 1 tablet (5 mg total) by mouth daily.   Black Pepper-Turmeric (TURMERIC COMPLEX/BLACK PEPPER PO) Take 1 tablet by mouth in the morning and at bedtime.   blood glucose meter kit and supplies KIT Dispense based on patient and insurance preference. Use up to four times daily as directed.   chlorpheniramine-HYDROcodone (TUSSIONEX) 10-8 MG/5ML SUER Take 5 mLs by mouth at bedtime as needed for cough.   ELIQUIS 5 MG TABS tablet TAKE 1 TABLET BY MOUTH TWICE A DAY (Patient taking differently: Take 5 mg by mouth 2 (two) times daily.)   esomeprazole (NEXIUM) 20 MG capsule  Take 20 mg by mouth daily.    gabapentin (NEURONTIN) 300 MG capsule Take 300 mg by mouth at bedtime.   glipiZIDE (GLUCOTROL) 10 MG tablet TAKE 1 TABLET (10 MG TOTAL) BY MOUTH 2 (TWO) TIMES DAILY BEFORE A MEAL. FOR DIABETES. (Patient taking differently: Take 10 mg by mouth 2 (two) times daily before a meal.)   Insulin Glargine (BASAGLAR KWIKPEN) 100 UNIT/ML Inject 20 Units into the skin at bedtime. For diabetes.   Insulin Pen Needle (BD PEN NEEDLE NANO U/F) 32G X 4 MM  MISC Use with insulin as prescribed  Dx Code: E11.9   ipratropium-albuterol (DUONEB) 0.5-2.5 (3) MG/3ML SOLN Take 3 mLs by nebulization every 4 (four) hours while awake for 3 days, THEN 3 mLs every 4 (four) hours as needed (shortness of breath or wheezing).   ivabradine (CORLANOR) 7.5 MG TABS tablet Take 2 tablets (15 mg) by mouth 2 hours prior to your Cardiac CT x 1 dose   Lancets MISC USE UP TO 3 TIMES DAILY AS DIRECTED   lidocaine-prilocaine (EMLA) cream Apply 1 application topically as needed. (Patient taking differently: Apply 1 application topically as needed (port access).)   losartan (COZAAR) 50 MG tablet Take 1 tablet (50 mg total) by mouth daily. For blood pressure.   Melatonin 10 MG CAPS Take 10 mg by mouth at bedtime.   metFORMIN (GLUCOPHAGE) 1000 MG tablet TAKE 1 TABLET (1,000 MG TOTAL) BY MOUTH 2 (TWO) TIMES DAILY WITH A MEAL. FOR DIABETES (Patient taking differently: Take 1,000 mg by mouth 2 (two) times daily with a meal.)   metoprolol tartrate (LOPRESSOR) 100 MG tablet Take 1 tablet (100 mg) by mouth 2 hours prior to your Cardiac CT x 1 dose   Multiple Vitamin (MULTI-VITAMINS) TABS Take 1 tablet by mouth daily.    Omega-3 Fatty Acids (SALMON OIL PO) Take 1 capsule by mouth daily.   ONETOUCH ULTRA test strip USE UP TO 4 TIMES DAILY AS DIRECTED   oxymetazoline (AFRIN) 0.05 % nasal spray Place 1 spray into both nostrils 2 (two) times daily as needed for congestion.   pioglitazone (ACTOS) 45 MG tablet TAKE 1 TABLET (45 MG  TOTAL) BY MOUTH DAILY. FOR DIABETES. (Patient taking differently: Take 45 mg by mouth daily.)   pravastatin (PRAVACHOL) 40 MG tablet TAKE 1 TABLET BY MOUTH EVERY DAY IN THE EVENING FOR CHOLESTEROL (Patient taking differently: Take 40 mg by mouth daily.)   pseudoephedrine-acetaminophen (TYLENOL SINUS) 30-500 MG TABS tablet Take 2 tablets by mouth at bedtime.   testosterone cypionate (DEPOTESTOSTERONE CYPIONATE) 200 MG/ML injection Inject 200 mg into the muscle every 14 (fourteen) days.    Allergies  Allergen Reactions   Bupropion     Racing heart   Hydrochlorothiazide Other (See Comments)    Cramping to lower extremities    SOCIAL HISTORY/FAMILY HISTORY   Reviewed in Epic:   Social History   Tobacco Use   Smoking status: Former   Smokeless tobacco: Never  Scientific laboratory technician Use: Never used  Substance Use Topics   Alcohol use: Yes    Comment: rarely   Drug use: Not Currently   Social History   Social History Narrative   Married.   Moved from [place].   Retired.   Family History  Problem Relation Age of Onset   Heart attack Mother    Cancer Mother    Hypertension Mother    Arthritis Father    Asthma Father    Cancer Father    COPD Father    Heart attack Father    Hypertension Sister    Cancer Sister    Diabetes Sister    Asthma Sister    Cancer Sister    COPD Sister    Arthritis Sister    Asthma Sister    Diabetes Sister    Birth defects Maternal Grandfather    Arthritis Paternal Grandmother    Diabetes Paternal Grandmother    Arthritis Paternal Grandfather    Asthma Son    Other Neg Hx  pituitary disorder    OBJCTIVE -PE, EKG, labs   Wt Readings from Last 3 Encounters:  11/13/21 291 lb 8 oz (132.2 kg)  10/29/21 286 lb 12 oz (130.1 kg)  10/18/21 282 lb (127.9 kg)    Physical Exam: BP 138/80 (BP Location: Right Arm, Patient Position: Sitting, Cuff Size: Large)   Pulse (!) 101   Ht $R'5\' 9"'xA$  (1.753 m)   Wt 291 lb 8 oz (132.2 kg)   SpO2 96%   BMI  43.05 kg/m  Physical Exam Vitals reviewed.  Constitutional:      General: He is not in acute distress.    Appearance: Normal appearance.     Comments: Morbidly obese with significant truncal obesity.  Well-groomed.  Nontoxic  HENT:     Head: Normocephalic and atraumatic.  Neck:     Vascular: No carotid bruit or JVD.  Cardiovascular:     Rate and Rhythm: Regular rhythm. Tachycardia present. No extrasystoles are present.    Chest Wall: PMI is not displaced (Unable to palpate).     Pulses: Decreased pulses (Difficult to palpate due to body habitus).     Heart sounds: S1 normal and S2 normal. Heart sounds are distant. No murmur heard.   No friction rub. No gallop.  Pulmonary:     Effort: Pulmonary effort is normal. No respiratory distress.     Breath sounds: Normal breath sounds.     Comments: Remarkably, no crackles, rhonchi, rales or adventitial sounds. Chest:     Chest wall: No tenderness.  Abdominal:     General: Bowel sounds are normal. There is no distension.     Palpations: Abdomen is soft.     Comments: Truncal obesity.  Unable to assess HSM  Musculoskeletal:        General: Swelling (Trivial to 1+ puffy Swelling.) present.     Cervical back: Normal range of motion and neck supple.  Neurological:     General: No focal deficit present.     Mental Status: He is alert and oriented to person, place, and time.  Psychiatric:        Mood and Affect: Mood normal.        Behavior: Behavior normal.        Thought Content: Thought content normal.        Judgment: Judgment normal.     Adult ECG Report  Rate: 101;  Rhythm: sinus tachycardia; 1  AVB.  Otherwise normal axis, intervals and durations.  Borderline voltage.  Narrative Interpretation: Borderline  Recent Labs: Reviewed Lab Results  Component Value Date   CHOL 131 08/13/2021   HDL 44.30 08/13/2021   LDLCALC 57 11/01/2019   LDLDIRECT 66.0 08/13/2021   TRIG 209.0 (H) 08/13/2021   CHOLHDL 3 08/13/2021   Lab Results   Component Value Date   CREATININE 1.29 (H) 10/29/2021   BUN 16 10/29/2021   NA 140 10/29/2021   K 4.4 10/29/2021   CL 105 10/29/2021   CO2 26 10/29/2021   CBC Latest Ref Rng & Units 10/29/2021 10/18/2021 10/12/2021  WBC 4.0 - 10.5 K/uL 4.7 2.5(L) 5.4  Hemoglobin 13.0 - 17.0 g/dL 12.3(L) 12.6(L) 10.2(L)  Hematocrit 39.0 - 52.0 % 37.2(L) 37.7(L) 31.7(L)  Platelets 150 - 400 K/uL 232 140 166    Lab Results  Component Value Date   HGBA1C 8.1 (H) 08/13/2021   Lab Results  Component Value Date   TSH 0.500 06/26/2021    ==================================================  COVID-19 Education: The signs and symptoms of COVID-19  were discussed with the patient and how to seek care for testing (follow up with PCP or arrange E-visit).    I spent a total of 42 minutes with the patient spent in direct patient consultation.  Additional time spent with chart review  / charting (studies, outside notes, etc): 12 min (precharting 15 minutes) Total Time: 69 min  Current medicines are reviewed at length with the patient today.  (+/- concerns) N/A  This visit occurred during the SARS-CoV-2 public health emergency.  Safety protocols were in place, including screening questions prior to the visit, additional usage of staff PPE, and extensive cleaning of exam room while observing appropriate contact time as indicated for disinfecting solutions.  Notice: This dictation was prepared with Dragon dictation along with smart phrase technology. Any transcriptional errors that result from this process are unintentional and may not be corrected upon review.   Studies Ordered:  Orders Placed This Encounter  Procedures   CT CORONARY MORPH W/CTA COR W/SCORE W/CA W/CM &/OR WO/CM   EKG 12-Lead     Patient Instructions / Medication Changes & Studies & Tests Ordered   Patient Instructions  Medication Instructions:  - Your physician has recommended you make the following change in your medication:   1)  START Bisoprolol 5 mg: - take 1 tablet by mouth once daily  *If you need a refill on your cardiac medications before your next appointment, please call your pharmacy*   Lab Work: - none ordered  If you have labs (blood work) drawn today and your tests are completely normal, you will receive your results only by: MyChart Message (if you have MyChart) OR A paper copy in the mail If you have any lab test that is abnormal or we need to change your treatment, we will call you to review the results.   Testing/Procedures:  1) Cardiac CT: - Your physician has requested that you have cardiac CT. Cardiac computed tomography (CT) is a painless test that uses an x-ray machine to take clear, detailed pictures of your heart.   Follow-Up: At [facility], you and your health needs are our priority.  As part of our continuing mission to provide you with exceptional heart care, we have created designated Provider Care Teams.  These Care Teams include your primary Cardiologist (physician) and Advanced Practice Providers (APPs -  Physician Assistants and Nurse Practitioners) who all work together to provide you with the care you need, when you need it.  We recommend signing up for the patient portal called "MyChart".  Sign up information is provided on this After Visit Summary.  MyChart is used to connect with patients for Virtual Visits (Telemedicine).  Patients are able to view lab/test results, encounter notes, upcoming appointments, etc.  Non-urgent messages can be sent to your provider as well.   To learn more about what you can do with MyChart, go to [email or web address].    Your next appointment:   4-5 week(s)/ after testing is completed  The format for your next appointment:   In Person  Provider:   Glenetta Hew, MD    Other Instructions  Patient instructions about bisoprolol and cardiac CTA provided.   Glenetta Hew, M.D., M.S. Interventional Cardiologist   Pager #  [phone] Phone # [phone] [address] [place], [place] [address]   Thank you for choosing [facility] [place]!!

## 2021-11-14 ENCOUNTER — Encounter: Payer: Self-pay | Admitting: Cardiology

## 2021-11-14 ENCOUNTER — Encounter: Payer: Self-pay | Admitting: Internal Medicine

## 2021-11-14 DIAGNOSIS — I2510 Atherosclerotic heart disease of native coronary artery without angina pectoris: Secondary | ICD-10-CM

## 2021-11-14 HISTORY — DX: Atherosclerotic heart disease of native coronary artery without angina pectoris: I25.10

## 2021-11-14 NOTE — Assessment & Plan Note (Signed)
Lipid panel is pretty good as of August.  Triglycerides are little elevated, but LDL is 66 on current dose of pravastatin.  He tends to go up and down based on his most recent reports.  For now, we will continue on pravastatin as we would plan to target LDL less than 70 if at all possible just to minimize risk.

## 2021-11-14 NOTE — Assessment & Plan Note (Signed)
Progressive coronary calcification noted on CT scan.  He has progressive dyspnea but is related to his underlying lung disease and cancer, however need to exclude ischemic CAD.  Preferable option for ischemic evaluation would be Coronary/Cardiac CTA.  Less favorable option Lexiscan Myoview.

## 2021-11-14 NOTE — Assessment & Plan Note (Addendum)
Profound exertional dyspnea.  He does not seem to bouncing back as quickly as he had in the past after thoracenteses.  With him having three-vessel disease and risk factors of diabetes, hypertension hyperlipidemia as well as obesity as a gentleman at 70 years old, not unreasonable to exclude ischemic etiology in order to not miss a potential treatable option.  Given his body habitus and three-vessel calcification, I think a Cardiac CT Angiogram would be more effective for a more definitive diagnostic technique.  He does get routine CT scans done by oncology and it would be nice to potentially dovetail these to procedures together.  I will notify of my CT coordinator to see if this could be done.  It would require some coordination with Dr. Julien Nordmann.  1 concern however would be the level of tachycardia.  I am starting him on bisoprolol now, and he will probably need combination of high-dose metoprolol plus or minus ivabradine to slow steroid enough to get rate control to perform CT scan.  With this as a possible issue, I do think performing the CT scan at one of the [facility] health sites in [place] with the newer machine would be a better option.  He just had a 2D echocardiogram revealing pretty normal EF and diastolic function.  Plan: Will attempt coronary CTA, if not successful, would have to convert to [facility] (as a less favorable alternative).

## 2021-11-14 NOTE — Assessment & Plan Note (Addendum)
He has some strange chest discomfort spells may have some discomfort if he overexerts.  It is possible that this could be a sign of a angina versus potentially still cancer related pain.  In order to exclude ischemic CAD, we have chosen to proceed with attempted Cardiac CT Angiogram.    (Less favorable l alternative would be Lexiscan Myoview -> I did discuss the risk, benefits alternatives indications of Myoview just in case this option has to be taken.)  Shared Decision Making/Informed Consent The risks [chest pain, shortness of breath, cardiac arrhythmias, dizziness, blood pressure fluctuations, myocardial infarction, stroke/transient ischemic attack, nausea, vomiting, allergic reaction, radiation exposure, metallic taste sensation and life-threatening complications (estimated to be 1 in 10,000)], benefits (risk stratification, diagnosing coronary artery disease, treatment guidance) and alternatives of a nuclear stress test were discussed in detail with Adam Wise and he agrees to proceed.

## 2021-11-14 NOTE — Assessment & Plan Note (Signed)
I truly believe this is probably the main cause for his dyspnea and recurrent pleural effusions.  Nothing from 2D echocardiogram would suggest any reason from a cardiac standpoint for him to have pleural effusion.  EF normal, diastolic function relatively normal as well.  Also above potential pulmonary hypertension standpoint, VRP size and pressures appear to be normal.

## 2021-11-14 NOTE — Assessment & Plan Note (Signed)
Borderline blood pressure today.  He is on amlodipine 10 mg and losartan 50 mg daily.  He remains somewhat tachycardic.  In the interest of the potentially considering a cardiac etiology, I would like to try to slow his heart rate down.  Plan: Start back nonselective beta-blocker: Bisoprolol 5 mg daily.

## 2021-11-18 MED FILL — Dexamethasone Sodium Phosphate Inj 100 MG/10ML: INTRAMUSCULAR | Qty: 1 | Status: AC

## 2021-11-19 ENCOUNTER — Ambulatory Visit: Payer: Medicare Other

## 2021-11-19 ENCOUNTER — Other Ambulatory Visit: Payer: Medicare Other

## 2021-11-19 ENCOUNTER — Other Ambulatory Visit: Payer: Self-pay

## 2021-11-19 ENCOUNTER — Ambulatory Visit: Payer: Medicare Other | Admitting: Physician Assistant

## 2021-11-19 ENCOUNTER — Inpatient Hospital Stay: Payer: Medicare Other

## 2021-11-19 ENCOUNTER — Inpatient Hospital Stay: Payer: Medicare Other | Attending: Internal Medicine

## 2021-11-19 ENCOUNTER — Inpatient Hospital Stay (HOSPITAL_BASED_OUTPATIENT_CLINIC_OR_DEPARTMENT_OTHER): Payer: Medicare Other | Admitting: Internal Medicine

## 2021-11-19 ENCOUNTER — Encounter: Payer: Self-pay | Admitting: Internal Medicine

## 2021-11-19 VITALS — BP 120/84 | HR 85 | Temp 97.2°F | Resp 20 | Ht 69.0 in | Wt 286.1 lb

## 2021-11-19 DIAGNOSIS — Z5111 Encounter for antineoplastic chemotherapy: Secondary | ICD-10-CM | POA: Diagnosis not present

## 2021-11-19 DIAGNOSIS — I10 Essential (primary) hypertension: Secondary | ICD-10-CM | POA: Diagnosis not present

## 2021-11-19 DIAGNOSIS — Z95828 Presence of other vascular implants and grafts: Secondary | ICD-10-CM

## 2021-11-19 DIAGNOSIS — C3490 Malignant neoplasm of unspecified part of unspecified bronchus or lung: Secondary | ICD-10-CM

## 2021-11-19 DIAGNOSIS — H538 Other visual disturbances: Secondary | ICD-10-CM | POA: Insufficient documentation

## 2021-11-19 DIAGNOSIS — C3411 Malignant neoplasm of upper lobe, right bronchus or lung: Secondary | ICD-10-CM | POA: Insufficient documentation

## 2021-11-19 DIAGNOSIS — E119 Type 2 diabetes mellitus without complications: Secondary | ICD-10-CM | POA: Diagnosis not present

## 2021-11-19 DIAGNOSIS — J90 Pleural effusion, not elsewhere classified: Secondary | ICD-10-CM | POA: Diagnosis not present

## 2021-11-19 DIAGNOSIS — R000 Tachycardia, unspecified: Secondary | ICD-10-CM | POA: Diagnosis not present

## 2021-11-19 DIAGNOSIS — C787 Secondary malignant neoplasm of liver and intrahepatic bile duct: Secondary | ICD-10-CM | POA: Diagnosis not present

## 2021-11-19 DIAGNOSIS — Z79899 Other long term (current) drug therapy: Secondary | ICD-10-CM | POA: Diagnosis not present

## 2021-11-19 LAB — CBC WITH DIFFERENTIAL (CANCER CENTER ONLY)
Abs Immature Granulocytes: 0.04 10*3/uL (ref 0.00–0.07)
Basophils Absolute: 0.1 10*3/uL (ref 0.0–0.1)
Basophils Relative: 1 %
Eosinophils Absolute: 0.1 10*3/uL (ref 0.0–0.5)
Eosinophils Relative: 2 %
HCT: 36.4 % — ABNORMAL LOW (ref 39.0–52.0)
Hemoglobin: 12 g/dL — ABNORMAL LOW (ref 13.0–17.0)
Immature Granulocytes: 1 %
Lymphocytes Relative: 15 %
Lymphs Abs: 0.6 10*3/uL — ABNORMAL LOW (ref 0.7–4.0)
MCH: 30 pg (ref 26.0–34.0)
MCHC: 33 g/dL (ref 30.0–36.0)
MCV: 91 fL (ref 80.0–100.0)
Monocytes Absolute: 0.7 10*3/uL (ref 0.1–1.0)
Monocytes Relative: 17 %
Neutro Abs: 2.6 10*3/uL (ref 1.7–7.7)
Neutrophils Relative %: 64 %
Platelet Count: 251 10*3/uL (ref 150–400)
RBC: 4 MIL/uL — ABNORMAL LOW (ref 4.22–5.81)
RDW: 14.9 % (ref 11.5–15.5)
WBC Count: 4.1 10*3/uL (ref 4.0–10.5)
nRBC: 0 % (ref 0.0–0.2)

## 2021-11-19 LAB — CMP (CANCER CENTER ONLY)
ALT: 19 U/L (ref 0–44)
AST: 21 U/L (ref 15–41)
Albumin: 3.7 g/dL (ref 3.5–5.0)
Alkaline Phosphatase: 69 U/L (ref 38–126)
Anion gap: 10 (ref 5–15)
BUN: 16 mg/dL (ref 8–23)
CO2: 24 mmol/L (ref 22–32)
Calcium: 8.6 mg/dL — ABNORMAL LOW (ref 8.9–10.3)
Chloride: 106 mmol/L (ref 98–111)
Creatinine: 1.34 mg/dL — ABNORMAL HIGH (ref 0.61–1.24)
GFR, Estimated: 57 mL/min — ABNORMAL LOW (ref >60)
Glucose, Bld: 183 mg/dL — ABNORMAL HIGH (ref 70–99)
Potassium: 4.4 mmol/L (ref 3.5–5.1)
Sodium: 140 mmol/L (ref 135–145)
Total Bilirubin: 0.4 mg/dL (ref 0.3–1.2)
Total Protein: 6.4 g/dL — ABNORMAL LOW (ref 6.5–8.1)

## 2021-11-19 LAB — FUNGUS CULTURE WITH STAIN

## 2021-11-19 LAB — FUNGAL ORGANISM REFLEX

## 2021-11-19 LAB — FUNGUS CULTURE RESULT

## 2021-11-19 MED ORDER — PALONOSETRON HCL INJECTION 0.25 MG/5ML
0.2500 mg | Freq: Once | INTRAVENOUS | Status: AC
  Administered 2021-11-19: 0.25 mg via INTRAVENOUS
  Filled 2021-11-19: qty 5

## 2021-11-19 MED ORDER — DEXAMETHASONE SODIUM PHOSPHATE 100 MG/10ML IJ SOLN
10.0000 mg | Freq: Once | INTRAMUSCULAR | Status: AC
  Administered 2021-11-19: 10 mg via INTRAVENOUS
  Filled 2021-11-19: qty 10

## 2021-11-19 MED ORDER — SODIUM CHLORIDE 0.9% FLUSH
10.0000 mL | Freq: Once | INTRAVENOUS | Status: AC
  Administered 2021-11-19: 10 mL

## 2021-11-19 MED ORDER — SODIUM CHLORIDE 0.9 % IV SOLN
3.1400 mg/m2 | Freq: Once | INTRAVENOUS | Status: AC
  Administered 2021-11-19: 8 mg via INTRAVENOUS
  Filled 2021-11-19: qty 16

## 2021-11-19 MED ORDER — SODIUM CHLORIDE 0.9 % IV SOLN: Freq: Once | INTRAVENOUS | Status: AC

## 2021-11-19 MED ORDER — SODIUM CHLORIDE 0.9% FLUSH
10.0000 mL | INTRAVENOUS | Status: DC | PRN
  Administered 2021-11-19: 10 mL

## 2021-11-19 MED ORDER — HEPARIN SOD (PORK) LOCK FLUSH 100 UNIT/ML IV SOLN
500.0000 [IU] | Freq: Once | INTRAVENOUS | Status: AC | PRN
  Administered 2021-11-19: 500 [IU]

## 2021-11-19 NOTE — Patient Instructions (Signed)
[facility] ONCOL[facility]   Discharge Instructions: Thank you for choosing [facility] to provide your oncology and hematology care.   If you have a lab appointment with the [facility], please go directly to the [facility] and check in at the registration area.   Wear comfortable clothing and clothing appropriate for easy access to any Portacath or PICC line.   We strive to give you quality time with your provider. You may need to reschedule your appointment if you arrive late (15 or more minutes).  Arriving late affects you and other patients whose appointments are after yours.  Also, if you miss three or more appointments without notifying the office, you may be dismissed from the clinic at the provider's discretion.      For prescription refill requests, have your pharmacy contact our office and allow 72 hours for refills to be completed.    Today you received the following chemotherapy and/or immunotherapy agents: Lurbinectedin (Zepzelca)      To help prevent nausea and vomiting after your treatment, we encourage you to take your nausea medication as directed.  BELOW ARE SYMPTOMS THAT SHOULD BE REPORTED IMMEDIATELY: *FEVER GREATER THAN 100.4 F (38 C) OR HIGHER *CHILLS OR SWEATING *NAUSEA AND VOMITING THAT IS NOT CONTROLLED WITH YOUR NAUSEA MEDICATION *UNUSUAL SHORTNESS OF BREATH *UNUSUAL BRUISING OR BLEEDING *URINARY PROBLEMS (pain or burning when urinating, or frequent urination) *BOWEL PROBLEMS (unusual diarrhea, constipation, pain near the anus) TENDERNESS IN MOUTH AND THROAT WITH OR WITHOUT PRESENCE OF ULCERS (sore throat, sores in mouth, or a toothache) UNUSUAL RASH, SWELLING OR PAIN  UNUSUAL VAGINAL DISCHARGE OR ITCHING   Items with * indicate a potential emergency and should be followed up as soon as possible or go to the Emergency Department if any problems should occur.  Please show the CHEMOTHERAPY ALERT CARD or IMMUNOTHERAPY ALERT  CARD at check-in to the Emergency Department and triage nurse.  Should you have questions after your visit or need to cancel or reschedule your appointment, please contact [facility]  Dept: [phone]  and follow the prompts.  Office hours are 8:00 a.m. to 4:30 p.m. Monday - Friday. Please note that voicemails left after 4:00 p.m. may not be returned until the following business day.  We are closed weekends and major holidays. You have access to a nurse at all times for urgent questions. Please call the main number to the clinic Dept: [phone] and follow the prompts.   For any non-urgent questions, you may also contact your provider using MyChart. We now offer e-Visits for anyone 24 and older to request care online for non-urgent symptoms. For details visit [email or web address].   Also download the MyChart app! Go to the app store, search "MyChart", open the app, select [facility], and log in with your MyChart username and password.  Due to Covid, a mask is required upon entering the hospital/clinic. If you do not have a mask, one will be given to you upon arrival. For doctor visits, patients may have 1 support person aged 71 or older with them. For treatment visits, patients cannot have anyone with them due to current Covid guidelines and our immunocompromised population.

## 2021-11-19 NOTE — Progress Notes (Signed)
[facility] [phone]   [phone]  OFFICE PROGRESS NOTE  [name], NP [place] [address]  DIAGNOSIS: Relapsed extensive stage (T4, N3, M1c)  small cell lung cancer diagnosed in April 2021 and presented with large right upper lobe/right hilar mass with ipsilateral and contralateral mediastinal and right supraclavicular lymphadenopathy in addition to multiple liver lesios. He has disease progression after the first line of chemotherapy in December 2021.  PRIOR THERAPY:  1) Palliative radiotherapy to the right upper lobe lung mass under the care of Dr. Lisbeth Renshaw. 2) Systemic chemotherapy with carboplatin for AUC of 5 on day 1, etoposide 100 mg/M2 on days 1, 2 and 3 in addition to Imfinzi 1500 mg IV every 3 weeks with chemotherapy then every 4 weeks for maintenance if the patient has no evidence for progression.  He will also receive Cosela 240 mg/m2 on the days of the chemotherapy.  Status post 9 cycles.  Starting from cycle #5 the patient will be on maintenance treatment with immunotherapy with Imfinzi 1500 mg IV every 4 weeks. Last dose of chemotherapy was given on November 13, 2020. This treatment was discontinued secondary to disease progression. 3) Systemic chemotherapy with carboplatin for AUC of 5 on day 1, etoposide 100 mg/M2 on days 1, 2 and 3, Tecentriq 1200 mg IV every 3 weeks as well as Cosela 250 mg/M2 on the days of the chemotherapy every 3 weeks.  First dose December 18, 2020.  Status post 8 cycles.   CURRENT THERAPY: Zepzelca (lurbinectedin) 3.2 mgm2 IV every 3 weeks. First dose expected on 06/05/21. Status post 8 cycles.   INTERVAL HISTORY: Adam Wise 70 y.o. male returns to the clinic today for follow-up visit.  The patient is feeling fine today with no concerning complaints except for the shortness of breath with exertion.  He was seen recently by cardiology and started on Zebeta for his tachycardia.  He is feeling  a little bit better.  He is scheduled for coronary CT next week.  He denied having any current chest pain has mild cough with no hemoptysis.  He denied having any recent weight loss or night sweats.  He has no nausea, vomiting, diarrhea or constipation.  He continues to tolerate his treatment with Zepzelca (lurbinectedin) fairly well.  He is here today for evaluation before starting cycle #9.  MEDICAL HISTORY: Past Medical History:  Diagnosis Date   Chickenpox    Chronic knee pain    Chronic low back pain    Coronary artery calcification seen on CAT scan    CT Chest 10/04/2021: Three-vessel coronary calcification noted.   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 He has disease progression after the first line of chemotherapy in December 2021.   Testosterone deficiency    Type 2 diabetes mellitus (HCC)     ALLERGIES:  is allergic to bupropion and hydrochlorothiazide.  MEDICATIONS:  Current Outpatient Medications  Medication Sig Dispense Refill   albuterol (VENTOLIN HFA) 108 (90 Base) MCG/ACT inhaler Inhale 2 puffs into the lungs every 6 (six) hours as needed for wheezing or shortness of breath. 18 g 0  amLODipine (NORVASC) 10 MG tablet TAKE 1 TABLET BY MOUTH EVERY DAY for blood pressure. (Patient taking differently: Take 10 mg by mouth daily.) 90 tablet 1   B-D 3CC LUER-LOK SYR 22GX1" 22G X 1" 3 ML MISC USE AS INSTRUCTED FOR TESTOSTERONE INJECTION EVERY 2 WEEKS 10 each 2   bisoprolol (ZEBETA) 5 MG tablet Take 1 tablet (5 mg total) by mouth daily. 30 tablet 6   Black Pepper-Turmeric (TURMERIC COMPLEX/BLACK PEPPER PO) Take 1 tablet by mouth in the  morning and at bedtime.     blood glucose meter kit and supplies KIT Dispense based on patient and insurance preference. Use up to four times daily as directed. 1 each 0   chlorpheniramine-HYDROcodone (TUSSIONEX) 10-8 MG/5ML SUER Take 5 mLs by mouth at bedtime as needed for cough. 473 mL 0   ELIQUIS 5 MG TABS tablet TAKE 1 TABLET BY MOUTH TWICE A DAY (Patient taking differently: Take 5 mg by mouth 2 (two) times daily.) 180 tablet 2   esomeprazole (NEXIUM) 20 MG capsule Take 20 mg by mouth daily.      gabapentin (NEURONTIN) 300 MG capsule Take 300 mg by mouth at bedtime.     glipiZIDE (GLUCOTROL) 10 MG tablet TAKE 1 TABLET (10 MG TOTAL) BY MOUTH 2 (TWO) TIMES DAILY BEFORE A MEAL. FOR DIABETES. (Patient taking differently: Take 10 mg by mouth 2 (two) times daily before a meal.) 180 tablet 1   Insulin Glargine (BASAGLAR KWIKPEN) 100 UNIT/ML Inject 20 Units into the skin at bedtime. For diabetes. 15 mL 3   Insulin Pen Needle (BD PEN NEEDLE NANO U/F) 32G X 4 MM MISC Use with insulin as prescribed  Dx Code: E11.9 100 each 2   ivabradine (CORLANOR) 7.5 MG TABS tablet Take 2 tablets (15 mg) by mouth 2 hours prior to your Cardiac CT x 1 dose 2 tablet 0   Lancets MISC USE UP TO 3 TIMES DAILY AS DIRECTED 100 each 2   lidocaine-prilocaine (EMLA) cream Apply 1 application topically as needed. (Patient taking differently: Apply 1 application topically as needed (port access).) 30 g 2   losartan (COZAAR) 50 MG tablet Take 1 tablet (50 mg total) by mouth daily. For blood pressure. 90 tablet 3   Melatonin 10 MG CAPS Take 10 mg by mouth at bedtime.     metFORMIN (GLUCOPHAGE) 1000 MG tablet TAKE 1 TABLET (1,000 MG TOTAL) BY MOUTH 2 (TWO) TIMES DAILY WITH A MEAL. FOR DIABETES (Patient taking differently: Take 1,000 mg by mouth 2 (two) times daily with a meal.) 180 tablet 1   metoprolol tartrate (LOPRESSOR) 100 MG tablet Take 1 tablet (100 mg) by mouth 2 hours prior to your Cardiac CT x 1 dose 1 tablet 0   Multiple  Vitamin (MULTI-VITAMINS) TABS Take 1 tablet by mouth daily.      Omega-3 Fatty Acids (SALMON OIL PO) Take 1 capsule by mouth daily.     ONETOUCH ULTRA test strip USE UP TO 4 TIMES DAILY AS DIRECTED 100 strip 5   oxymetazoline (AFRIN) 0.05 % nasal spray Place 1 spray into both nostrils 2 (two) times daily as needed for congestion.     pioglitazone (ACTOS) 45 MG tablet TAKE 1 TABLET (45 MG TOTAL) BY MOUTH DAILY. FOR DIABETES. (Patient taking differently: Take 45 mg by mouth daily.) 90 tablet 0   pravastatin (PRAVACHOL) 40 MG tablet TAKE 1 TABLET BY MOUTH EVERY DAY IN THE EVENING FOR CHOLESTEROL (Patient taking differently: Take 40 mg by mouth daily.) 90  tablet 1   pseudoephedrine-acetaminophen (TYLENOL SINUS) 30-500 MG TABS tablet Take 2 tablets by mouth at bedtime.     testosterone cypionate (DEPOTESTOSTERONE CYPIONATE) 200 MG/ML injection Inject 200 mg into the muscle every 14 (fourteen) days.     ipratropium-albuterol (DUONEB) 0.5-2.5 (3) MG/3ML SOLN Take 3 mLs by nebulization every 4 (four) hours while awake for 3 days, THEN 3 mLs every 4 (four) hours as needed (shortness of breath or wheezing). 360 mL 0   No current facility-administered medications for this visit.    SURGICAL HISTORY:  Past Surgical History:  Procedure Laterality Date   COLONOSCOPY WITH PROPOFOL N/A 12/17/2018   Procedure: COLONOSCOPY WITH PROPOFOL;  Surgeon: Virgel Manifold, MD;  Location: [facility] ENDOSCOPY;  Service: Gastroenterology;  Laterality: N/A;   IR IMAGING GUIDED PORT INSERTION  04/17/2020   JOINT REPLACEMENT Bilateral    REPLACEMENT TOTAL KNEE BILATERAL  2015   TONSILLECTOMY  1960   TRANSTHORACIC ECHOCARDIOGRAM  05/2020   a) 05/2020: EF 55 to 60%.  No R WMA.  Mild LVH.  Indeterminate LVEDP.  Unable to assess RVP.  Normal aortic and mitral valves.  Mildly elevated RAP.; b) 09/2021: EF 50-55%. No RWMA. Mild LVH. ~ LVEDP. Mild LA Dilation. NORMAL RV/RAP.  Normal MV/AoV.    REVIEW OF SYSTEMS:  A comprehensive  review of systems was negative except for: Constitutional: positive for fatigue Respiratory: positive for dyspnea on exertion   PHYSICAL EXAMINATION: General appearance: alert, cooperative, fatigued, and no distress Head: atraumatic Neck: no adenopathy, no JVD, supple, symmetrical, trachea midline, and thyroid not enlarged, symmetric, no tenderness/mass/nodules Lymph nodes: Cervical, supraclavicular, and axillary nodes normal. Resp: clear to auscultation bilaterally Back: symmetric, no curvature. ROM normal. No CVA tenderness. Cardio: regular rate and rhythm, S1, S2 normal, no murmur, click, rub or gallop GI: soft, non-tender; bowel sounds normal; no masses,  no organomegaly Extremities: extremities normal, atraumatic, no cyanosis or edema  ECOG PERFORMANCE STATUS: 1 - Symptomatic but completely ambulatory  Blood pressure 120/84, pulse 85, temperature (!) 97.2 F (36.2 C), temperature source Tympanic, resp. rate 20, height $RemoveBe'5\' 9"'yCCmvlesM$  (1.753 m), weight 286 lb 1.6 oz (129.8 kg), SpO2 96 %.  LABORATORY DATA: Lab Results  Component Value Date   WBC 4.1 11/19/2021   HGB 12.0 (L) 11/19/2021   HCT 36.4 (L) 11/19/2021   MCV 91.0 11/19/2021   PLT 251 11/19/2021      Chemistry      Component Value Date/Time   NA 140 11/19/2021 1013   K 4.4 11/19/2021 1013   CL 106 11/19/2021 1013   CO2 24 11/19/2021 1013   BUN 16 11/19/2021 1013   CREATININE 1.34 (H) 11/19/2021 1013   CREATININE 1.45 (H) 10/18/2021 1533      Component Value Date/Time   CALCIUM 8.6 (L) 11/19/2021 1013   ALKPHOS 69 11/19/2021 1013   AST 21 11/19/2021 1013   ALT 19 11/19/2021 1013   BILITOT 0.4 11/19/2021 1013       RADIOGRAPHIC STUDIES: No results found.  ASSESSMENT AND PLAN: This is a very pleasant 70 years old white male recently diagnosed with extensive stage (T4, N3, M1 C) small cell lung cancer presented with large right upper lobe lung mass in addition to mediastinal and right supraclavicular lymphadenopathy  and multiple metastatic liver lesions diagnosed in April 2021. The patient underwent systemic chemotherapy with carboplatin for AUC of 5 on day 1, etoposide 100 mg/M2 on days 1, 2 and 3 in addition to Cosela for myeloprotection during the days  of the chemotherapy.  He will also receive immunotherapy with Imfinzi on day one of the chemotherapy cycle.  Status post 9 cycles.  Starting from cycle #5 he is on maintenance treatment with Imfinzi 1500 mg IV every 4 weeks. The patient has been tolerating this treatment well with no concerning adverse effect except for the worsening cough and shortness of breath recently.   He had repeat CT scan of the chest, abdomen pelvis performed recently.  I personally and independently reviewed the scan images and discussed the results with the patient and his wife today. Unfortunately his scan showed evidence for disease progression with increase in the right pleural effusion as well as enlargement of multiple liver lesions. I recommended for the patient to discontinue his current maintenance treatment with Imfinzi at this point. He resumed systemic chemotherapy with carboplatin for AUC of 5 on day 1, etoposide 100 mg/M2 on days 1, 2 and 3 with Tecentriq 1200 mg every 3 weeks as well as Cosela on the days of the chemotherapy.  He is status post 8 cycles.  This treatment was discontinued secondary to disease progression. The patient is currently undergoing third line treatment with systemic chemotherapy with Zepzelca (lurbinectedin) 3.2 Mg/KG every 3 weeks status post 8 cycles.  The patient has been tolerating his treatment with Zepzelca (lurbinectedin) fairly well. I recommended for him to proceed with cycle #9 today as planned. I will see him back for follow-up visit in 3 weeks for evaluation with repeat CT scan of the chest, abdomen and pelvis for restaging of his disease. For the tachycardia and cardiac condition is followed by cardiology Dr. Ellyn Hack and started treatment  with Zebeta.   The patient was advised to call immediately if he has any other concerning symptoms in the interval The patient voices understanding of current disease status and treatment options and is in agreement with the current care plan.  All questions were answered. The patient knows to call the clinic with any problems, questions or concerns. We can certainly see the patient much sooner if necessary.  Disclaimer: This note was dictated with voice recognition software. Similar sounding words can inadvertently be transcribed and may not be corrected upon review.

## 2021-11-20 NOTE — Progress Notes (Signed)
Thank you so much   Do we need to change orders?  Glenetta Hew, MD

## 2021-11-26 ENCOUNTER — Telehealth (HOSPITAL_COMMUNITY): Payer: Self-pay | Admitting: Emergency Medicine

## 2021-11-26 NOTE — Telephone Encounter (Signed)
Reaching out to patient to offer assistance regarding upcoming cardiac imaging study; pt verbalizes understanding of appt date/time, parking situation and where to check in, pre-test NPO status and medications ordered, and verified current allergies; name and call back number provided for further questions should they arise Marchia Bond RN Navigator Cardiac Imaging [facility] [phone] office [phone] cell  Arrival time 1130 Oral contrast 10a, 11a 10mg  ivabradine+ 100mg  metoprolol 10a Port-a-cath

## 2021-11-27 ENCOUNTER — Other Ambulatory Visit: Payer: Self-pay

## 2021-11-27 ENCOUNTER — Ambulatory Visit (HOSPITAL_COMMUNITY)
Admission: RE | Admit: 2021-11-27 | Discharge: 2021-11-27 | Disposition: A | Discharge status: Home / Self Care | Payer: Medicare Other | Source: Ambulatory Visit | Attending: Cardiology | Admitting: Cardiology

## 2021-11-27 ENCOUNTER — Encounter (HOSPITAL_COMMUNITY): Payer: Self-pay

## 2021-11-27 ENCOUNTER — Ambulatory Visit (HOSPITAL_COMMUNITY): Admission: RE | Admit: 2021-11-27 | Payer: Medicare Other | Source: Ambulatory Visit

## 2021-11-27 DIAGNOSIS — R0789 Other chest pain: Secondary | ICD-10-CM | POA: Diagnosis not present

## 2021-11-27 DIAGNOSIS — J984 Other disorders of lung: Secondary | ICD-10-CM | POA: Diagnosis not present

## 2021-11-27 DIAGNOSIS — I2510 Atherosclerotic heart disease of native coronary artery without angina pectoris: Secondary | ICD-10-CM | POA: Diagnosis not present

## 2021-11-27 DIAGNOSIS — C3490 Malignant neoplasm of unspecified part of unspecified bronchus or lung: Secondary | ICD-10-CM | POA: Diagnosis not present

## 2021-11-27 DIAGNOSIS — N400 Enlarged prostate without lower urinary tract symptoms: Secondary | ICD-10-CM | POA: Diagnosis not present

## 2021-11-27 DIAGNOSIS — N281 Cyst of kidney, acquired: Secondary | ICD-10-CM | POA: Diagnosis not present

## 2021-11-27 DIAGNOSIS — J90 Pleural effusion, not elsewhere classified: Secondary | ICD-10-CM | POA: Diagnosis not present

## 2021-11-27 DIAGNOSIS — I700 Atherosclerosis of aorta: Secondary | ICD-10-CM | POA: Diagnosis not present

## 2021-11-27 DIAGNOSIS — K769 Liver disease, unspecified: Secondary | ICD-10-CM | POA: Diagnosis not present

## 2021-11-27 MED ORDER — DILTIAZEM HCL 25 MG/5ML IV SOLN
5.0000 mg | INTRAVENOUS | Status: DC | PRN
Start: 2021-11-27 — End: 2021-11-28

## 2021-11-27 MED ORDER — IOHEXOL 350 MG/ML SOLN
100.0000 mL | Freq: Once | INTRAVENOUS | Status: AC | PRN
  Administered 2021-11-27: 16:00:00 100 mL via INTRAVENOUS

## 2021-11-27 MED ORDER — NITROGLYCERIN 0.4 MG SL SUBL
SUBLINGUAL_TABLET | SUBLINGUAL | Status: AC
  Administered 2021-11-27: 13:00:00 0.8 mg via SUBLINGUAL
  Filled 2021-11-27: qty 2

## 2021-11-27 MED ORDER — METOPROLOL TARTRATE 5 MG/5ML IV SOLN
5.0000 mg | INTRAVENOUS | Status: DC | PRN
Start: 2021-11-27 — End: 2021-11-28
  Administered 2021-11-27: 13:00:00 5 mg via INTRAVENOUS

## 2021-11-27 MED ORDER — METOPROLOL TARTRATE 5 MG/5ML IV SOLN
INTRAVENOUS | Status: AC
  Filled 2021-11-27: qty 10

## 2021-11-27 MED ORDER — NITROGLYCERIN 0.4 MG SL SUBL: 0.8000 mg | SUBLINGUAL_TABLET | Freq: Once | SUBLINGUAL | Status: AC

## 2021-11-27 MED ORDER — DILTIAZEM HCL 25 MG/5ML IV SOLN
INTRAVENOUS | Status: AC
  Filled 2021-11-27: qty 5

## 2021-11-28 ENCOUNTER — Other Ambulatory Visit: Payer: Self-pay | Admitting: Physician Assistant

## 2021-11-28 ENCOUNTER — Encounter: Payer: Self-pay | Admitting: Internal Medicine

## 2021-11-28 DIAGNOSIS — C3411 Malignant neoplasm of upper lobe, right bronchus or lung: Secondary | ICD-10-CM

## 2021-11-28 DIAGNOSIS — J90 Pleural effusion, not elsewhere classified: Secondary | ICD-10-CM

## 2021-11-28 NOTE — Telephone Encounter (Signed)
Per Cassandra H, PA-C schedule pt for thoracentesis.  I have arranged for pt to have thoracentesis tomorrow 11/29/21 at [facility]. Pt has been advised his arrival time is 9:30am and to please present to the Main Entrance.   Also, due to provider being away from the office week of 12/11/21, we have moved his office visit from 12/11/21 to next week 12/05/21 but his port flush/labs and infusion remain on 12/11/21. Pt is also aware of this and expressed understanding of this information.

## 2021-11-29 ENCOUNTER — Encounter (HOSPITAL_COMMUNITY): Payer: Self-pay | Admitting: Physician Assistant

## 2021-11-29 ENCOUNTER — Other Ambulatory Visit: Payer: Self-pay

## 2021-11-29 ENCOUNTER — Ambulatory Visit (HOSPITAL_COMMUNITY)
Admission: RE | Admit: 2021-11-29 | Discharge: 2021-11-29 | Disposition: A | Discharge status: Home / Self Care | Payer: Medicare Other | Source: Ambulatory Visit | Attending: Physician Assistant | Admitting: Physician Assistant

## 2021-11-29 ENCOUNTER — Other Ambulatory Visit (HOSPITAL_COMMUNITY): Payer: Self-pay | Admitting: Physician Assistant

## 2021-11-29 DIAGNOSIS — J984 Other disorders of lung: Secondary | ICD-10-CM | POA: Diagnosis not present

## 2021-11-29 DIAGNOSIS — J90 Pleural effusion, not elsewhere classified: Secondary | ICD-10-CM

## 2021-11-29 DIAGNOSIS — C3411 Malignant neoplasm of upper lobe, right bronchus or lung: Secondary | ICD-10-CM

## 2021-11-29 DIAGNOSIS — R918 Other nonspecific abnormal finding of lung field: Secondary | ICD-10-CM | POA: Diagnosis not present

## 2021-11-29 DIAGNOSIS — Z48813 Encounter for surgical aftercare following surgery on the respiratory system: Secondary | ICD-10-CM | POA: Diagnosis not present

## 2021-11-29 HISTORY — PX: IR THORACENTESIS ASP PLEURAL SPACE W/IMG GUIDE: IMG5380

## 2021-11-29 MED ORDER — LIDOCAINE HCL 1 % IJ SOLN
INTRAMUSCULAR | Status: AC
  Administered 2021-11-29: 10 mL
  Filled 2021-11-29: qty 20

## 2021-11-29 NOTE — Progress Notes (Deleted)
Adam Wise OFFICE PROGRESS NOTE  [name], NP [place] [address]  DIAGNOSIS: Relapsed extensive stage (T4, N3, M1c)  small cell lung cancer diagnosed in April 2021 and presented with large right upper lobe/right hilar mass with ipsilateral and contralateral mediastinal and right supraclavicular lymphadenopathy in addition to multiple liver lesions. He has disease progression after the first line of chemotherapy in December 2021.  PRIOR THERAPY: 1) Palliative radiotherapy to the right upper lobe lung mass under the care of Dr. Lisbeth Renshaw. 2) Systemic chemotherapy with carboplatin for AUC of 5 on day 1, etoposide 100 mg/M2 on days 1, 2 and 3 in addition to Imfinzi 1500 mg IV every 3 weeks with chemotherapy then every 4 weeks for maintenance if the patient has no evidence for progression.  He will also receive Cosela 240 mg/m2 on the days of the chemotherapy.  Status post 9 cycles.  Starting from cycle #5 the patient will be on maintenance treatment with immunotherapy with Imfinzi 1500 mg IV every 4 weeks. Last dose of chemotherapy was given on November 13, 2020. This treatment was discontinued secondary to disease progression 3) Systemic chemotherapy with carboplatin for AUC of 5 on day 1, etoposide 100 mg/M2 on days 1, 2 and 3, Tecentriq 1200 mg IV every 3 weeks as well as Cosela 250 mg/M2 on the days of the chemotherapy every 3 weeks.  First dose December 18, 2020.  Status post 8 cycles.  CURRENT THERAPY: Zepzelca 3.2 mgm2 IV every 3 weeks. First dose expected on 06/05/21. Status post 9 cycles.   INTERVAL HISTORY: Adam Wise 70 y.o. male returns to the clinic today for a follow-up visit.  The patient messaged the clinic last week due to the chief complaints of significant shortness of breath.  The patient has a history of recurrent pleural effusions.  The patient underwent a restaging CT scan which showed a moderate sized pleural effusion.  He had a  thoracentesis on 11/29/2021 which yielded 1.6 Adam of fluid.  The patient's breathing is improved at this time. He still has shortness of breath but it is improved compared to prior. His cough also has improved. He is wondering about pulmonary rehab. Last month, the patient inquired about procedures to help with his recurrent effusion. He was referred to pulmonology and is scheduled to see them next week on 12/30. After consideration, he is not sure if he is still interested in pleurx catheter placement at this time.    Otherwise he denies any recent fever, chills, or night sweats.  For cough, he has a prescription for tussinex. The patient is a history of PE and is compliant with his blood thinner.  He denies any chest pain.  Denies any nausea, vomiting, diarrhea, or constipation.  He denies headaches. He has some increased blurry vision and states he is overdue for his eye exam. He has diabetes and is aware he needs his eye exam annually. The patient recently had a restaging CT scan. He is here for evaluation and to review his scan results before starting cycle #10 next week.     MEDICAL HISTORY: Past Medical History:  Diagnosis Date   Chickenpox    Chronic knee pain    Chronic low back pain    Coronary artery calcification seen on CAT scan    CT Chest 10/04/2021: Three-vessel coronary calcification noted.   Essential hypertension    GERD (gastroesophageal reflux disease)    Hyperlipidemia    OSA (obstructive  sleep apnea)    With nighttime oxygen supplementation   T4, M3, M1 C Metastatic Small Cell Lung Cancer 03/2020   large right upper lobe/right hilar mass with ipsilateral and contralateral mediastinal and right supraclavicular lymphadenopathy in addition to multiple liver lesios. He has disease progression after the first line of chemotherapy in December 2021.   Testosterone deficiency    Type 2 diabetes mellitus (HCC)     ALLERGIES:  is allergic to bupropion and  hydrochlorothiazide.  MEDICATIONS:  Current Outpatient Medications  Medication Sig Dispense Refill   albuterol (VENTOLIN HFA) 108 (90 Base) MCG/ACT inhaler Inhale 2 puffs into the lungs every 6 (six) hours as needed for wheezing or shortness of breath. 18 g 0   amLODipine (NORVASC) 10 MG tablet TAKE 1 TABLET BY MOUTH EVERY DAY for blood pressure. (Patient taking differently: Take 10 mg by mouth daily.) 90 tablet 1   B-D 3CC LUER-LOK SYR 22GX1" 22G X 1" 3 ML MISC USE AS INSTRUCTED FOR TESTOSTERONE INJECTION EVERY 2 WEEKS 10 each 2   bisoprolol (ZEBETA) 5 MG tablet Take 1 tablet (5 mg total) by mouth daily. 30 tablet 6   Black Pepper-Turmeric (TURMERIC COMPLEX/BLACK PEPPER PO) Take 1 tablet by mouth in the morning and at bedtime.     blood glucose meter kit and supplies KIT Dispense based on patient and insurance preference. Use up to four times daily as directed. 1 each 0   chlorpheniramine-HYDROcodone (TUSSIONEX) 10-8 MG/5ML SUER Take 5 mLs by mouth at bedtime as needed for cough. 473 mL 0   ELIQUIS 5 MG TABS tablet TAKE 1 TABLET BY MOUTH TWICE A DAY (Patient taking differently: Take 5 mg by mouth 2 (two) times daily.) 180 tablet 2   esomeprazole (NEXIUM) 20 MG capsule Take 20 mg by mouth daily.      gabapentin (NEURONTIN) 300 MG capsule Take 300 mg by mouth at bedtime.     glipiZIDE (GLUCOTROL) 10 MG tablet TAKE 1 TABLET (10 MG TOTAL) BY MOUTH 2 (TWO) TIMES DAILY BEFORE A MEAL. FOR DIABETES. 180 tablet 3   Insulin Glargine (BASAGLAR KWIKPEN) 100 UNIT/ML Inject 20 Units into the skin at bedtime. For diabetes. 15 mL 3   Insulin Pen Needle (BD PEN NEEDLE NANO U/F) 32G X 4 MM MISC Use with insulin as prescribed  Dx Code: E11.9 100 each 2   ipratropium-albuterol (DUONEB) 0.5-2.5 (3) MG/3ML SOLN Take 3 mLs by nebulization every 4 (four) hours while awake for 3 days, THEN 3 mLs every 4 (four) hours as needed (shortness of breath or wheezing). 360 mL 0   ivabradine (CORLANOR) 7.5 MG TABS tablet Take 2  tablets (15 mg) by mouth 2 hours prior to your Cardiac CT x 1 dose 2 tablet 0   Lancets MISC USE UP TO 3 TIMES DAILY AS DIRECTED 100 each 2   lidocaine-prilocaine (EMLA) cream Apply 1 application topically as needed. (Patient taking differently: Apply 1 application topically as needed (port access).) 30 g 2   losartan (COZAAR) 50 MG tablet Take 1 tablet (50 mg total) by mouth daily. For blood pressure. 90 tablet 3   Melatonin 10 MG CAPS Take 10 mg by mouth at bedtime.     metFORMIN (GLUCOPHAGE) 1000 MG tablet TAKE 1 TABLET (1,000 MG TOTAL) BY MOUTH 2 (TWO) TIMES DAILY WITH A MEAL. FOR DIABETES (Patient taking differently: Take 1,000 mg by mouth 2 (two) times daily with a meal.) 180 tablet 1   metoprolol tartrate (LOPRESSOR) 100 MG tablet Take 1  tablet (100 mg) by mouth 2 hours prior to your Cardiac CT x 1 dose 1 tablet 0   Multiple Vitamin (MULTI-VITAMINS) TABS Take 1 tablet by mouth daily.      Omega-3 Fatty Acids (SALMON OIL PO) Take 1 capsule by mouth daily.     ONETOUCH ULTRA test strip USE UP TO 4 TIMES DAILY AS DIRECTED 100 strip 5   oxymetazoline (AFRIN) 0.05 % nasal spray Place 1 spray into both nostrils 2 (two) times daily as needed for congestion.     pioglitazone (ACTOS) 45 MG tablet TAKE 1 TABLET (45 MG TOTAL) BY MOUTH DAILY. FOR DIABETES. (Patient taking differently: Take 45 mg by mouth daily.) 90 tablet 0   pravastatin (PRAVACHOL) 40 MG tablet TAKE 1 TABLET BY MOUTH EVERY DAY IN THE EVENING FOR CHOLESTEROL (Patient taking differently: Take 40 mg by mouth daily.) 90 tablet 1   pseudoephedrine-acetaminophen (TYLENOL SINUS) 30-500 MG TABS tablet Take 2 tablets by mouth at bedtime.     testosterone cypionate (DEPOTESTOSTERONE CYPIONATE) 200 MG/ML injection Inject 200 mg into the muscle every 14 (fourteen) days.     No current facility-administered medications for this visit.    SURGICAL HISTORY:  Past Surgical History:  Procedure Laterality Date   COLONOSCOPY WITH PROPOFOL N/A  12/17/2018   Procedure: COLONOSCOPY WITH PROPOFOL;  Surgeon: Virgel Manifold, MD;  Location: [facility] ENDOSCOPY;  Service: Gastroenterology;  Laterality: N/A;   IR IMAGING GUIDED PORT INSERTION  04/17/2020   IR THORACENTESIS ASP PLEURAL SPACE W/IMG GUIDE  11/29/2021   JOINT REPLACEMENT Bilateral    REPLACEMENT TOTAL KNEE BILATERAL  2015   TONSILLECTOMY  1960   TRANSTHORACIC ECHOCARDIOGRAM  05/2020   a) 05/2020: EF 55 to 60%.  No R WMA.  Mild LVH.  Indeterminate LVEDP.  Unable to assess RVP.  Normal aortic and mitral valves.  Mildly elevated RAP.; b) 09/2021: EF 50-55%. No RWMA. Mild LVH. ~ LVEDP. Mild LA Dilation. NORMAL RV/RAP.  Normal MV/AoV.    REVIEW OF SYSTEMS:   Review of Systems  Constitutional: Negative for appetite change, chills, fatigue, fever and unexpected weight change.  HENT: Negative for mouth sores, nosebleeds, sore throat and trouble swallowing.   Eyes: Negative for eye problems and icterus.  Respiratory: Positive for dyspnea on exertion, improved since having a thoracentesis. Cough improved compared to prior. Negative for hemoptysis and wheezing.   Cardiovascular: Negative for chest pain and leg swelling.  Gastrointestinal: Negative for abdominal pain, constipation, diarrhea, nausea and vomiting.  Genitourinary: Negative for bladder incontinence, difficulty urinating, dysuria, frequency and hematuria.   Musculoskeletal: Positive for occassional back discomfort with increased right pleural effusion. Negative for  gait problem, neck pain and neck stiffness.  Skin: Negative for itching and rash.  Neurological: Negative for dizziness, extremity weakness, gait problem, headaches, light-headedness and seizures.  Hematological: Negative for adenopathy. Does not bruise/bleed easily.  Psychiatric/Behavioral: Negative for confusion, depression and sleep disturbance. The patient is not nervous/anxious.     PHYSICAL EXAMINATION:  Blood pressure (!) 154/81, pulse 81, temperature  97.7 F (36.5 C), resp. rate 20, weight 289 lb (131.1 kg), SpO2 96 %.  ECOG PERFORMANCE STATUS: 1  Physical Exam  Constitutional: Oriented to person, place, and time and well-developed, well-nourished, and in no distress.  HENT:  Head: Normocephalic and atraumatic.  Mouth/Throat: Oropharynx is clear and moist. No oropharyngeal exudate.  Eyes: Conjunctivae are normal. Right eye exhibits no discharge. Left eye exhibits no discharge. No scleral icterus.  Neck: Normal range of motion. Neck supple.  Cardiovascular: Normal rate, regular rhythm, normal heart sounds and intact distal pulses.   Pulmonary/Chest: Effort normal. Decreased breath sounds at base of right lower lung field. No respiratory distress. No wheezes. No rales.  Abdominal: Soft. Bowel sounds are normal. Exhibits no distension and no mass. There is no tenderness.  Musculoskeletal: Normal range of motion. Exhibits no edema.  Lymphadenopathy:    No cervical adenopathy.  Neurological: Alert and oriented to person, place, and time. Exhibits normal muscle tone. Gait normal. Coordination normal.  Skin: Skin is warm and dry. No rash noted. Not diaphoretic. No erythema. No pallor.  Psychiatric: Mood, memory and judgment normal.  Vitals reviewed  LABORATORY DATA: Lab Results  Component Value Date   WBC 4.1 11/19/2021   HGB 12.0 (Adam) 11/19/2021   HCT 36.4 (Adam) 11/19/2021   MCV 91.0 11/19/2021   PLT 251 11/19/2021      Chemistry      Component Value Date/Time   NA 140 11/19/2021 1013   K 4.4 11/19/2021 1013   CL 106 11/19/2021 1013   CO2 24 11/19/2021 1013   BUN 16 11/19/2021 1013   CREATININE 1.34 (H) 11/19/2021 1013   CREATININE 1.45 (H) 10/18/2021 1533      Component Value Date/Time   CALCIUM 8.6 (Adam) 11/19/2021 1013   ALKPHOS 69 11/19/2021 1013   AST 21 11/19/2021 1013   ALT 19 11/19/2021 1013   BILITOT 0.4 11/19/2021 1013       RADIOGRAPHIC STUDIES:  DG Chest 1 View  Result Date: 11/29/2021 CLINICAL DATA:   Post right thoracentesis. EXAM: CHEST  1 VIEW COMPARISON:  10/12/2021 FINDINGS: Chronic volume loss in the right hemithorax. Chronic scarring or fibrotic changes along the medial right lung. Negative for pneumothorax. Left lung is clear. Heart size is upper limits of normal but stable. Right jugular Port-A-Cath with the tip near the superior cavoatrial junction. IMPRESSION: 1. Negative for pneumothorax following right thoracentesis. 2. Chronic volume loss with scarring in the right lung. Electronically Signed   By: Markus Daft M.D.   On: 11/29/2021 10:18   CT CHEST ABDOMEN PELVIS W CONTRAST  Result Date: 11/28/2021 CLINICAL DATA:  Small-cell lung cancer.  Restaging. EXAM: CT CHEST, ABDOMEN, AND PELVIS WITH CONTRAST TECHNIQUE: Multidetector CT imaging of the chest, abdomen and pelvis was performed following the standard protocol during bolus administration of intravenous contrast. CONTRAST:  16mL OMNIPAQUE IOHEXOL 350 MG/ML SOLN COMPARISON:  Chest abdomen pelvis CT 10/04/2021. CTA Chest 10/11/2021 FINDINGS: CT CHEST FINDINGS Cardiovascular: The heart size is normal. No substantial pericardial effusion. Coronary artery calcification is evident. Mild atherosclerotic calcification is noted in the wall of the thoracic aorta. Right Port-A-Cath tip is positioned at the SVC/RA junction. Mediastinum/Nodes: No mediastinal lymphadenopathy. There is no hilar lymphadenopathy. The esophagus has normal imaging features. There is no axillary lymphadenopathy. Lungs/Pleura: Similar appearance of volume loss and post radiation scarring in the right parahilar and suprahilar lung. Some areas show more prominent soft tissue density today including 1.8 x 1.2 cm nodular focus on image 39/5. 10 mm right upper lobe nodule on 34/5 is new in the interval. Right pleural effusion is slightly progressive in the interval now moderate in size. No new suspicious nodule or mass in the left lung. No left pleural effusion. Musculoskeletal: No  worrisome lytic or sclerotic osseous abnormality. CT ABDOMEN PELVIS FINDINGS Hepatobiliary: Multiple low-density liver lesions previously described as metastatic disease have decreased in the interval. Index lesion inferior left liver on 52/3 measures 10 mm today, decreased from 20  mm (remeasured) on the previous exam. Subcapsular lesion in the right liver measuring 16 mm on image 57/3 today was 36 mm previously (remeasured). 9 mm lesion towards the dome of the liver on 46/3 was 48 mm previously (remeasured). There is no evidence for gallstones, gallbladder wall thickening, or pericholecystic fluid. No intrahepatic or extrahepatic biliary dilation. Pancreas: No focal mass lesion. No dilatation of the main duct. No intraparenchymal cyst. No peripancreatic edema. Spleen: No splenomegaly. No focal mass lesion. Adrenals/Urinary Tract: Left adrenal gland unremarkable stable 13 mm right adrenal nodule. Multiple right renal cysts are similar to prior. Left kidney unremarkable. No evidence for hydroureter. The urinary bladder appears normal for the degree of distention. Stomach/Bowel: Stomach is unremarkable. No gastric wall thickening. No evidence of outlet obstruction. Duodenum is normally positioned as is the ligament of Treitz. Duodenal diverticulum noted. No small bowel wall thickening. No small bowel dilatation. The terminal ileum is normal. The appendix is normal. No gross colonic mass. No colonic wall thickening. Vascular/Lymphatic: There is mild atherosclerotic calcification of the abdominal aorta without aneurysm. There is no gastrohepatic or hepatoduodenal ligament lymphadenopathy. No retroperitoneal or mesenteric lymphadenopathy. No pelvic sidewall lymphadenopathy. Reproductive: The prostate gland is mildly enlarged. Seminal vesicles are unremarkable. Other: No intraperitoneal free fluid. Musculoskeletal: No worrisome lytic or sclerotic osseous abnormality. IMPRESSION: 1. Similar appearance of volume loss and  post radiation scarring in the right parahilar and suprahilar lung. Some areas show more prominent nodular soft tissue density today. While this may be treatment related, close attention on follow-up recommended as recurrent disease not excluded. 2. Slight progression of right pleural effusion, now moderate in size. 3. Clear interval decrease in size of multiple hepatic metastases. 4. Stable small right adrenal nodule. 5. Aortic Atherosclerosis (ICD10-I70.0). Electronically Signed   By: Misty Stanley M.D.   On: 11/28/2021 13:08   CT CORONARY MORPH W/CTA COR W/SCORE W/CA W/CM &/OR WO/CM  Addendum Date: 11/27/2021   ADDENDUM REPORT: 11/27/2021 16:44 EXAM: OVER-READ INTERPRETATION  CT CHEST The following report is an over-read performed by radiologist Dr. Eben Burow [facility] on 11/27/2021. This over-read does not include interpretation of cardiac or coronary anatomy or pathology. The coronary calcium score/coronary CTA interpretation by the cardiologist is attached. COMPARISON:  Chest CT on 10/11/2021 FINDINGS: A moderate to large right pleural effusion is seen, but remains stable since previous study. Compressive atelectasis seen in the right lung base. Stable pulmonary opacity seen in the right paramediastinal lung zone with mild bronchiectasis, consistent with post radiation changes. Visualized portion of left lung is clear. The visualized portions of the mediastinum and chest wall are unremarkable. IMPRESSION: Stable moderate to large right pleural effusion, and compressive atelectasis in right lung base. Stable post radiation changes in right paramediastinal lung zone. Electronically Signed   By: Marlaine Hind M.D.   On: 11/27/2021 16:44   Result Date: 11/27/2021 HISTORY: 70 yo male with shortness of breath, coronary artery calcification EXAM: Cardiac/Coronary CTA TECHNIQUE: The patient was scanned on a [employer]. PROTOCOL: A 120 kV prospective scan was triggered in the  descending thoracic aorta at 111 HU's. Axial non-contrast 3 mm slices were carried out through the heart. The data set was analyzed on a dedicated work station and scored using the [place]. Gantry rotation speed was 250 msecs and collimation was .6 mm. Beta blockade and 0.8 mg of sl NTG was given. The 3D data set was reconstructed in 5% intervals of the 35-75 % of the R-R cycle. Diastolic phases  were analyzed on a dedicated work station using MPR, MIP and VRT modes. The patient received 100 mL of contrast. FINDINGS: Quality: Poor, motion artifact, HR 74 Coronary calcium score: The patient's coronary artery calcium score is 657, which places the patient in the 77th percentile. Coronary arteries: Normal coronary origins.  Right dominance. Right Coronary Artery: Dominant.  Diffuse calcification. Left Main Coronary Artery: Normal. Bifurcates into the LAD and LCx arteries. Left Anterior Descending Coronary Artery: Large anterior vessel that reaches the apex - there is minimal to mild 24-49% proximal stenosis (CADRADS2). Large D1 branch with minimal 1-24% mixed stenosis (CADRADS1). Left Circumflex Artery: Aorta: Normal size, 35 mm at the mid ascending aorta (level of the PA bifurcation) measured double oblique. Aortic atherosclerosis. No dissection. Aortic Valve: Trileaflet. No calcifications. Other findings: Normal pulmonary vein drainage into the left atrium. Normal left atrial appendage without a thrombus. Dilated main pulmonary artery at 37 mm, suggestive of pulmonary hypertension. Catheter noted in the SVC. IMPRESSION: 1. 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 2. Coronary calcium score of 657. This was 77th percentile for age and sex matched control. 3. Normal coronary origin with right dominance. 4. Dilated main pulmonary artery at 37 mm, suggestive of pulmonary hypertension. 5. Catheter noted in the SVC. 6. Aortic atherosclerosis. Electronically Signed: By: Pixie Casino M.D. On: 11/27/2021 14:56   IR THORACENTESIS ASP PLEURAL SPACE W/IMG GUIDE  Result Date: 11/29/2021 INDICATION: History of lung cancer, recurrent pleural effusion EXAM: ULTRASOUND GUIDED RIGHT THORACENTESIS MEDICATIONS: None. COMPLICATIONS: None immediate. PROCEDURE: An ultrasound guided thoracentesis was thoroughly discussed with the patient and questions answered. The benefits, risks, alternatives and complications were also discussed. The patient understands and wishes to proceed with the procedure. Written consent was obtained. Ultrasound was performed to localize and mark an adequate pocket of fluid in the right chest. The area was then prepped and draped in the normal sterile fashion. 1% Lidocaine was used for local anesthesia. Under ultrasound guidance a 6 Fr Safe-T-Centesis catheter was introduced. Thoracentesis was performed. The catheter was removed and a dressing applied. FINDINGS: A total of approximately 1650cc of red-tinged fluid was removed. IMPRESSION: Successful ultrasound guided right thoracentesis yielding 1650cc of pleural fluid. Read by Pasty Spillers, PA-C Electronically Signed   By: Ruthann Cancer M.D.   On: 11/29/2021 10:21     ASSESSMENT/PLAN:  This is a very pleasant 70 year old Caucasian male diagnosed with extensive stage (T4, N3, M1C) small cell lung cancer presented with large right upper lobe lung mass in addition to mediastinal and right supraclavicular lymphadenopathy and multiple metastatic liver lesions diagnosed in April 2021.   The patient initially underwent systemic chemotherapy with carboplatin for an AUC of 5 on day 1, etoposide 100 mg per metered squared on days 1, 2, and 3 in addition to [facility] for myeloprotection.  He also received immunotherapy with Imfinzi on day 1 of every chemotherapy cycle.  He is status post 9 cycles.  Starting from cycle #5 he was on single agent immunotherapy with Imfinzi IV every 4 weeks.  The patient had evidence of disease  progression on his scan from December 2021.  He was then started on systemic chemotherapy with carboplatin for an AUC of 5 on day 1, etoposide 100 mg per metered squared on days 1, 2, and 3 in addition to Tecentriq 1200 mg on day 1, the patient is status post 9 cycles.  Starting from cycle #5, the patient started maintenance immunotherapy with Tecentriq.  This was discontinued due to evidence of disease progression.  The patient had a restaging CT scan performed.  Dr. Julien Nordmann personally and independently reviewed the scan discussed results with the patient today.  The scan did not show any evidence of disease progression.  The liver lesions have decreased in size. There was some increase in prominent nodular soft tissue density today. Radiology commented that while this may be treatment related, close attention on follow-up recommended as recurrent disease not excluded. We will need to continue to monitor this closely though on follow-up imaging.    We will see the patient back for follow-up visit in 4 weeks for evaluation before starting cycle #11.  He will proceed with cycle #10 next week as scheduled.  He will continue to use Tussionex for his cough.  He is scheduled with pulmonology next week for consultation as the patient last month was interested in other options for his recurrent effusions. The patient is not sure anymore if he is interested in pleurx catheter placement. The patient is interested and has inquired about pulmonary rehab. Dr. Julien Nordmann recommended that the patient talk to pulmonology next week about recommendations regarding pleurx vs close monitoring of his recurrent effusions as well as pulmonary rehab.   The patient was advised to call immediately if he has any concerning symptoms in the interval. The patient voices understanding of current disease status and treatment options and is in agreement with the current care plan. All questions were answered. The patient knows to call  the clinic with any problems, questions or concerns. We can certainly see the patient much sooner if necessary       No orders of the defined types were placed in this encounter.    I spent {CHL ONC TIME VISIT - TOIZT:[id number]} counseling the patient face to face. The total time spent in the appointment was {CHL ONC TIME VISIT - ASNKN:[id number]}.  Adam Wise Adam Vincy Feliz, PA-C 12/05/21

## 2021-11-29 NOTE — Procedures (Signed)
PROCEDURE SUMMARY:  Successful US guided right thoracentesis. Yielded 1650cc of red tinged fluid. Pt tolerated procedure well. No immediate complications.  Specimen not sent for labs. CXR ordered.  EBL < 5 mL  Tremaine Fuhriman PA-C 11/29/2021 10:11 AM

## 2021-12-01 ENCOUNTER — Other Ambulatory Visit: Payer: Self-pay | Admitting: Primary Care

## 2021-12-01 DIAGNOSIS — E119 Type 2 diabetes mellitus without complications: Secondary | ICD-10-CM

## 2021-12-03 LAB — ACID FAST CULTURE WITH REFLEXED SENSITIVITIES (MYCOBACTERIA): Acid Fast Culture: NEGATIVE

## 2021-12-05 ENCOUNTER — Other Ambulatory Visit: Payer: Self-pay

## 2021-12-05 ENCOUNTER — Inpatient Hospital Stay (HOSPITAL_BASED_OUTPATIENT_CLINIC_OR_DEPARTMENT_OTHER): Payer: Medicare Other | Admitting: Physician Assistant

## 2021-12-05 VITALS — BP 154/81 | HR 81 | Temp 97.7°F | Resp 20 | Wt 289.0 lb

## 2021-12-05 DIAGNOSIS — C3411 Malignant neoplasm of upper lobe, right bronchus or lung: Secondary | ICD-10-CM

## 2021-12-05 DIAGNOSIS — J90 Pleural effusion, not elsewhere classified: Secondary | ICD-10-CM

## 2021-12-05 DIAGNOSIS — C787 Secondary malignant neoplasm of liver and intrahepatic bile duct: Secondary | ICD-10-CM | POA: Diagnosis not present

## 2021-12-05 DIAGNOSIS — Z5111 Encounter for antineoplastic chemotherapy: Secondary | ICD-10-CM | POA: Diagnosis not present

## 2021-12-05 DIAGNOSIS — R000 Tachycardia, unspecified: Secondary | ICD-10-CM | POA: Diagnosis not present

## 2021-12-05 DIAGNOSIS — Z79899 Other long term (current) drug therapy: Secondary | ICD-10-CM | POA: Diagnosis not present

## 2021-12-05 NOTE — Progress Notes (Signed)
[facility] OFFICE PROGRESS NOTE  [name], NP [place] [address]  DIAGNOSIS: Relapsed extensive stage (T4, N3, M1c)  small cell lung cancer diagnosed in April 2021 and presented with large right upper lobe/right hilar mass with ipsilateral and contralateral mediastinal and right supraclavicular lymphadenopathy in addition to multiple liver lesions. He has disease progression after the first line of chemotherapy in December 2021.  PRIOR THERAPY: 1) Palliative radiotherapy to the right upper lobe lung mass under the care of Dr. Lisbeth Renshaw. 2) Systemic chemotherapy with carboplatin for AUC of 5 on day 1, etoposide 100 mg/M2 on days 1, 2 and 3 in addition to Imfinzi 1500 mg IV every 3 weeks with chemotherapy then every 4 weeks for maintenance if the patient has no evidence for progression.  He will also receive Cosela 240 mg/m2 on the days of the chemotherapy.  Status post 9 cycles.  Starting from cycle #5 the patient will be on maintenance treatment with immunotherapy with Imfinzi 1500 mg IV every 4 weeks. Last dose of chemotherapy was given on November 13, 2020. This treatment was discontinued secondary to disease progression 3) Systemic chemotherapy with carboplatin for AUC of 5 on day 1, etoposide 100 mg/M2 on days 1, 2 and 3, Tecentriq 1200 mg IV every 3 weeks as well as Cosela 250 mg/M2 on the days of the chemotherapy every 3 weeks.  First dose December 18, 2020.  Status post 8 cycles.  CURRENT THERAPY: Zepzelca 3.2 mgm2 IV every 3 weeks. First dose expected on 06/05/21. Status post 9 cycles.   INTERVAL HISTORY: Adam Wise 70 y.o. male returns to the clinic today for a follow-up visit.  The patient messaged the clinic last week due to the chief complaints of significant shortness of breath.  The patient has a history of recurrent pleural effusions.  The patient underwent a restaging CT scan which showed a moderate sized pleural effusion.  He had a  thoracentesis on 11/29/2021 which yielded 1.6 L of fluid.  The patient's breathing is improved at this time. He still has shortness of breath but it is improved compared to prior. His cough also has improved. He is wondering about pulmonary rehab. Last month, the patient inquired about procedures to help with his recurrent effusion. He was referred to pulmonology and is scheduled to see them next week on 12/30. After consideration, he is not sure if he is still interested in pleurx catheter placement at this time.    Otherwise he denies any recent fever, chills, or night sweats.  For cough, he has a prescription for tussinex. The patient is a history of PE and is compliant with his blood thinner.  He denies any chest pain.  Denies any nausea, vomiting, diarrhea, or constipation.  He denies headaches. He has some increased blurry vision and states he is overdue for his eye exam. He has diabetes and is aware he needs his eye exam annually. The patient recently had a restaging CT scan. He is here for evaluation and to review his scan results before starting cycle #10 next week.     MEDICAL HISTORY: Past Medical History:  Diagnosis Date   Chickenpox    Chronic knee pain    Chronic low back pain    Coronary artery calcification seen on CAT scan    CT Chest 10/04/2021: Three-vessel coronary calcification noted.   Essential hypertension    GERD (gastroesophageal reflux disease)    Hyperlipidemia    OSA (obstructive  sleep apnea)    With nighttime oxygen supplementation   T4, M3, M1 C Metastatic Small Cell Lung Cancer 03/2020   large right upper lobe/right hilar mass with ipsilateral and contralateral mediastinal and right supraclavicular lymphadenopathy in addition to multiple liver lesios. He has disease progression after the first line of chemotherapy in December 2021.   Testosterone deficiency    Type 2 diabetes mellitus (HCC)     ALLERGIES:  is allergic to bupropion and  hydrochlorothiazide.  MEDICATIONS:  Current Outpatient Medications  Medication Sig Dispense Refill   albuterol (VENTOLIN HFA) 108 (90 Base) MCG/ACT inhaler Inhale 2 puffs into the lungs every 6 (six) hours as needed for wheezing or shortness of breath. 18 g 0   amLODipine (NORVASC) 10 MG tablet TAKE 1 TABLET BY MOUTH EVERY DAY for blood pressure. (Patient taking differently: Take 10 mg by mouth daily.) 90 tablet 1   B-D 3CC LUER-LOK SYR 22GX1" 22G X 1" 3 ML MISC USE AS INSTRUCTED FOR TESTOSTERONE INJECTION EVERY 2 WEEKS 10 each 2   bisoprolol (ZEBETA) 5 MG tablet Take 1 tablet (5 mg total) by mouth daily. 30 tablet 6   Black Pepper-Turmeric (TURMERIC COMPLEX/BLACK PEPPER PO) Take 1 tablet by mouth in the morning and at bedtime.     blood glucose meter kit and supplies KIT Dispense based on patient and insurance preference. Use up to four times daily as directed. 1 each 0   chlorpheniramine-HYDROcodone (TUSSIONEX) 10-8 MG/5ML SUER Take 5 mLs by mouth at bedtime as needed for cough. 473 mL 0   ELIQUIS 5 MG TABS tablet TAKE 1 TABLET BY MOUTH TWICE A DAY (Patient taking differently: Take 5 mg by mouth 2 (two) times daily.) 180 tablet 2   esomeprazole (NEXIUM) 20 MG capsule Take 20 mg by mouth daily.      gabapentin (NEURONTIN) 300 MG capsule Take 300 mg by mouth at bedtime.     glipiZIDE (GLUCOTROL) 10 MG tablet TAKE 1 TABLET (10 MG TOTAL) BY MOUTH 2 (TWO) TIMES DAILY BEFORE A MEAL. FOR DIABETES. 180 tablet 3   Insulin Glargine (BASAGLAR KWIKPEN) 100 UNIT/ML Inject 20 Units into the skin at bedtime. For diabetes. 15 mL 3   Insulin Pen Needle (BD PEN NEEDLE NANO U/F) 32G X 4 MM MISC Use with insulin as prescribed  Dx Code: E11.9 100 each 2   ipratropium-albuterol (DUONEB) 0.5-2.5 (3) MG/3ML SOLN Take 3 mLs by nebulization every 4 (four) hours while awake for 3 days, THEN 3 mLs every 4 (four) hours as needed (shortness of breath or wheezing). 360 mL 0   ivabradine (CORLANOR) 7.5 MG TABS tablet Take 2  tablets (15 mg) by mouth 2 hours prior to your Cardiac CT x 1 dose 2 tablet 0   Lancets MISC USE UP TO 3 TIMES DAILY AS DIRECTED 100 each 2   lidocaine-prilocaine (EMLA) cream Apply 1 application topically as needed. (Patient taking differently: Apply 1 application topically as needed (port access).) 30 g 2   losartan (COZAAR) 50 MG tablet Take 1 tablet (50 mg total) by mouth daily. For blood pressure. 90 tablet 3   Melatonin 10 MG CAPS Take 10 mg by mouth at bedtime.     metFORMIN (GLUCOPHAGE) 1000 MG tablet TAKE 1 TABLET (1,000 MG TOTAL) BY MOUTH 2 (TWO) TIMES DAILY WITH A MEAL. FOR DIABETES (Patient taking differently: Take 1,000 mg by mouth 2 (two) times daily with a meal.) 180 tablet 1   metoprolol tartrate (LOPRESSOR) 100 MG tablet Take 1  tablet (100 mg) by mouth 2 hours prior to your Cardiac CT x 1 dose 1 tablet 0   Multiple Vitamin (MULTI-VITAMINS) TABS Take 1 tablet by mouth daily.      Omega-3 Fatty Acids (SALMON OIL PO) Take 1 capsule by mouth daily.     ONETOUCH ULTRA test strip USE UP TO 4 TIMES DAILY AS DIRECTED 100 strip 5   oxymetazoline (AFRIN) 0.05 % nasal spray Place 1 spray into both nostrils 2 (two) times daily as needed for congestion.     pioglitazone (ACTOS) 45 MG tablet TAKE 1 TABLET (45 MG TOTAL) BY MOUTH DAILY. FOR DIABETES. (Patient taking differently: Take 45 mg by mouth daily.) 90 tablet 0   pravastatin (PRAVACHOL) 40 MG tablet TAKE 1 TABLET BY MOUTH EVERY DAY IN THE EVENING FOR CHOLESTEROL (Patient taking differently: Take 40 mg by mouth daily.) 90 tablet 1   pseudoephedrine-acetaminophen (TYLENOL SINUS) 30-500 MG TABS tablet Take 2 tablets by mouth at bedtime.     testosterone cypionate (DEPOTESTOSTERONE CYPIONATE) 200 MG/ML injection Inject 200 mg into the muscle every 14 (fourteen) days.     No current facility-administered medications for this visit.    SURGICAL HISTORY:  Past Surgical History:  Procedure Laterality Date   COLONOSCOPY WITH PROPOFOL N/A  12/17/2018   Procedure: COLONOSCOPY WITH PROPOFOL;  Surgeon: Virgel Manifold, MD;  Location: [facility] ENDOSCOPY;  Service: Gastroenterology;  Laterality: N/A;   IR IMAGING GUIDED PORT INSERTION  04/17/2020   IR THORACENTESIS ASP PLEURAL SPACE W/IMG GUIDE  11/29/2021   JOINT REPLACEMENT Bilateral    REPLACEMENT TOTAL KNEE BILATERAL  2015   TONSILLECTOMY  1960   TRANSTHORACIC ECHOCARDIOGRAM  05/2020   a) 05/2020: EF 55 to 60%.  No R WMA.  Mild LVH.  Indeterminate LVEDP.  Unable to assess RVP.  Normal aortic and mitral valves.  Mildly elevated RAP.; b) 09/2021: EF 50-55%. No RWMA. Mild LVH. ~ LVEDP. Mild LA Dilation. NORMAL RV/RAP.  Normal MV/AoV.    REVIEW OF SYSTEMS:   Review of Systems  Constitutional: Negative for appetite change, chills, fatigue, fever and unexpected weight change.  HENT: Negative for mouth sores, nosebleeds, sore throat and trouble swallowing.   Eyes: Negative for eye problems and icterus.  Respiratory: Positive for dyspnea on exertion, improved since having a thoracentesis. Cough improved compared to prior. Negative for hemoptysis and wheezing.   Cardiovascular: Negative for chest pain and leg swelling.  Gastrointestinal: Negative for abdominal pain, constipation, diarrhea, nausea and vomiting.  Genitourinary: Negative for bladder incontinence, difficulty urinating, dysuria, frequency and hematuria.   Musculoskeletal: Positive for occassional back discomfort with increased right pleural effusion. Negative for  gait problem, neck pain and neck stiffness.  Skin: Negative for itching and rash.  Neurological: Negative for dizziness, extremity weakness, gait problem, headaches, light-headedness and seizures.  Hematological: Negative for adenopathy. Does not bruise/bleed easily.  Psychiatric/Behavioral: Negative for confusion, depression and sleep disturbance. The patient is not nervous/anxious.     PHYSICAL EXAMINATION:  Blood pressure (!) 154/81, pulse 81, temperature  97.7 F (36.5 C), resp. rate 20, weight 289 lb (131.1 kg), SpO2 96 %.  ECOG PERFORMANCE STATUS: 1  Physical Exam  Constitutional: Oriented to person, place, and time and well-developed, well-nourished, and in no distress.  HENT:  Head: Normocephalic and atraumatic.  Mouth/Throat: Oropharynx is clear and moist. No oropharyngeal exudate.  Eyes: Conjunctivae are normal. Right eye exhibits no discharge. Left eye exhibits no discharge. No scleral icterus.  Neck: Normal range of motion. Neck supple.  Cardiovascular: Normal rate, regular rhythm, normal heart sounds and intact distal pulses.   Pulmonary/Chest: Effort normal. Decreased breath sounds at base of right lower lung field. No respiratory distress. No wheezes. No rales.  Abdominal: Soft. Bowel sounds are normal. Exhibits no distension and no mass. There is no tenderness.  Musculoskeletal: Normal range of motion. Exhibits no edema.  Lymphadenopathy:    No cervical adenopathy.  Neurological: Alert and oriented to person, place, and time. Exhibits normal muscle tone. Gait normal. Coordination normal.  Skin: Skin is warm and dry. No rash noted. Not diaphoretic. No erythema. No pallor.  Psychiatric: Mood, memory and judgment normal.  Vitals reviewed  LABORATORY DATA: Lab Results  Component Value Date   WBC 4.1 11/19/2021   HGB 12.0 (L) 11/19/2021   HCT 36.4 (L) 11/19/2021   MCV 91.0 11/19/2021   PLT 251 11/19/2021      Chemistry      Component Value Date/Time   NA 140 11/19/2021 1013   K 4.4 11/19/2021 1013   CL 106 11/19/2021 1013   CO2 24 11/19/2021 1013   BUN 16 11/19/2021 1013   CREATININE 1.34 (H) 11/19/2021 1013   CREATININE 1.45 (H) 10/18/2021 1533      Component Value Date/Time   CALCIUM 8.6 (L) 11/19/2021 1013   ALKPHOS 69 11/19/2021 1013   AST 21 11/19/2021 1013   ALT 19 11/19/2021 1013   BILITOT 0.4 11/19/2021 1013       RADIOGRAPHIC STUDIES:  DG Chest 1 View  Result Date: 11/29/2021 CLINICAL DATA:   Post right thoracentesis. EXAM: CHEST  1 VIEW COMPARISON:  10/12/2021 FINDINGS: Chronic volume loss in the right hemithorax. Chronic scarring or fibrotic changes along the medial right lung. Negative for pneumothorax. Left lung is clear. Heart size is upper limits of normal but stable. Right jugular Port-A-Cath with the tip near the superior cavoatrial junction. IMPRESSION: 1. Negative for pneumothorax following right thoracentesis. 2. Chronic volume loss with scarring in the right lung. Electronically Signed   By: Markus Daft M.D.   On: 11/29/2021 10:18   CT CHEST ABDOMEN PELVIS W CONTRAST  Result Date: 11/28/2021 CLINICAL DATA:  Small-cell lung cancer.  Restaging. EXAM: CT CHEST, ABDOMEN, AND PELVIS WITH CONTRAST TECHNIQUE: Multidetector CT imaging of the chest, abdomen and pelvis was performed following the standard protocol during bolus administration of intravenous contrast. CONTRAST:  174mL OMNIPAQUE IOHEXOL 350 MG/ML SOLN COMPARISON:  Chest abdomen pelvis CT 10/04/2021. CTA Chest 10/11/2021 FINDINGS: CT CHEST FINDINGS Cardiovascular: The heart size is normal. No substantial pericardial effusion. Coronary artery calcification is evident. Mild atherosclerotic calcification is noted in the wall of the thoracic aorta. Right Port-A-Cath tip is positioned at the SVC/RA junction. Mediastinum/Nodes: No mediastinal lymphadenopathy. There is no hilar lymphadenopathy. The esophagus has normal imaging features. There is no axillary lymphadenopathy. Lungs/Pleura: Similar appearance of volume loss and post radiation scarring in the right parahilar and suprahilar lung. Some areas show more prominent soft tissue density today including 1.8 x 1.2 cm nodular focus on image 39/5. 10 mm right upper lobe nodule on 34/5 is new in the interval. Right pleural effusion is slightly progressive in the interval now moderate in size. No new suspicious nodule or mass in the left lung. No left pleural effusion. Musculoskeletal: No  worrisome lytic or sclerotic osseous abnormality. CT ABDOMEN PELVIS FINDINGS Hepatobiliary: Multiple low-density liver lesions previously described as metastatic disease have decreased in the interval. Index lesion inferior left liver on 52/3 measures 10 mm today, decreased from 20  mm (remeasured) on the previous exam. Subcapsular lesion in the right liver measuring 16 mm on image 57/3 today was 36 mm previously (remeasured). 9 mm lesion towards the dome of the liver on 46/3 was 48 mm previously (remeasured). There is no evidence for gallstones, gallbladder wall thickening, or pericholecystic fluid. No intrahepatic or extrahepatic biliary dilation. Pancreas: No focal mass lesion. No dilatation of the main duct. No intraparenchymal cyst. No peripancreatic edema. Spleen: No splenomegaly. No focal mass lesion. Adrenals/Urinary Tract: Left adrenal gland unremarkable stable 13 mm right adrenal nodule. Multiple right renal cysts are similar to prior. Left kidney unremarkable. No evidence for hydroureter. The urinary bladder appears normal for the degree of distention. Stomach/Bowel: Stomach is unremarkable. No gastric wall thickening. No evidence of outlet obstruction. Duodenum is normally positioned as is the ligament of Treitz. Duodenal diverticulum noted. No small bowel wall thickening. No small bowel dilatation. The terminal ileum is normal. The appendix is normal. No gross colonic mass. No colonic wall thickening. Vascular/Lymphatic: There is mild atherosclerotic calcification of the abdominal aorta without aneurysm. There is no gastrohepatic or hepatoduodenal ligament lymphadenopathy. No retroperitoneal or mesenteric lymphadenopathy. No pelvic sidewall lymphadenopathy. Reproductive: The prostate gland is mildly enlarged. Seminal vesicles are unremarkable. Other: No intraperitoneal free fluid. Musculoskeletal: No worrisome lytic or sclerotic osseous abnormality. IMPRESSION: 1. Similar appearance of volume loss and  post radiation scarring in the right parahilar and suprahilar lung. Some areas show more prominent nodular soft tissue density today. While this may be treatment related, close attention on follow-up recommended as recurrent disease not excluded. 2. Slight progression of right pleural effusion, now moderate in size. 3. Clear interval decrease in size of multiple hepatic metastases. 4. Stable small right adrenal nodule. 5. Aortic Atherosclerosis (ICD10-I70.0). Electronically Signed   By: Misty Stanley M.D.   On: 11/28/2021 13:08   CT CORONARY MORPH W/CTA COR W/SCORE W/CA W/CM &/OR WO/CM  Addendum Date: 11/27/2021   ADDENDUM REPORT: 11/27/2021 16:44 EXAM: OVER-READ INTERPRETATION  CT CHEST The following report is an over-read performed by radiologist Dr. Eben Burow [facility] on 11/27/2021. This over-read does not include interpretation of cardiac or coronary anatomy or pathology. The coronary calcium score/coronary CTA interpretation by the cardiologist is attached. COMPARISON:  Chest CT on 10/11/2021 FINDINGS: A moderate to large right pleural effusion is seen, but remains stable since previous study. Compressive atelectasis seen in the right lung base. Stable pulmonary opacity seen in the right paramediastinal lung zone with mild bronchiectasis, consistent with post radiation changes. Visualized portion of left lung is clear. The visualized portions of the mediastinum and chest wall are unremarkable. IMPRESSION: Stable moderate to large right pleural effusion, and compressive atelectasis in right lung base. Stable post radiation changes in right paramediastinal lung zone. Electronically Signed   By: Marlaine Hind M.D.   On: 11/27/2021 16:44   Result Date: 11/27/2021 HISTORY: 70 yo male with shortness of breath, coronary artery calcification EXAM: Cardiac/Coronary CTA TECHNIQUE: The patient was scanned on a [employer]. PROTOCOL: A 120 kV prospective scan was triggered in the  descending thoracic aorta at 111 HU's. Axial non-contrast 3 mm slices were carried out through the heart. The data set was analyzed on a dedicated work station and scored using the Leon. Gantry rotation speed was 250 msecs and collimation was .6 mm. Beta blockade and 0.8 mg of sl NTG was given. The 3D data set was reconstructed in 5% intervals of the 35-75 % of the R-R cycle. Diastolic phases  were analyzed on a dedicated work station using MPR, MIP and VRT modes. The patient received 100 mL of contrast. FINDINGS: Quality: Poor, motion artifact, HR 74 Coronary calcium score: The patient's coronary artery calcium score is 657, which places the patient in the 77th percentile. Coronary arteries: Normal coronary origins.  Right dominance. Right Coronary Artery: Dominant.  Diffuse calcification. Left Main Coronary Artery: Normal. Bifurcates into the LAD and LCx arteries. Left Anterior Descending Coronary Artery: Large anterior vessel that reaches the apex - there is minimal to mild 24-49% proximal stenosis (CADRADS2). Large D1 branch with minimal 1-24% mixed stenosis (CADRADS1). Left Circumflex Artery: Aorta: Normal size, 35 mm at the mid ascending aorta (level of the PA bifurcation) measured double oblique. Aortic atherosclerosis. No dissection. Aortic Valve: Trileaflet. No calcifications. Other findings: Normal pulmonary vein drainage into the left atrium. Normal left atrial appendage without a thrombus. Dilated main pulmonary artery at 37 mm, suggestive of pulmonary hypertension. Catheter noted in the SVC. IMPRESSION: 1. 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 2. Coronary calcium score of 657. This was 77th percentile for age and sex matched control. 3. Normal coronary origin with right dominance. 4. Dilated main pulmonary artery at 37 mm, suggestive of pulmonary hypertension. 5. Catheter noted in the SVC. 6. Aortic atherosclerosis. Electronically Signed: By: Pixie Casino M.D. On: 11/27/2021 14:56   IR THORACENTESIS ASP PLEURAL SPACE W/IMG GUIDE  Result Date: 11/29/2021 INDICATION: History of lung cancer, recurrent pleural effusion EXAM: ULTRASOUND GUIDED RIGHT THORACENTESIS MEDICATIONS: None. COMPLICATIONS: None immediate. PROCEDURE: An ultrasound guided thoracentesis was thoroughly discussed with the patient and questions answered. The benefits, risks, alternatives and complications were also discussed. The patient understands and wishes to proceed with the procedure. Written consent was obtained. Ultrasound was performed to localize and mark an adequate pocket of fluid in the right chest. The area was then prepped and draped in the normal sterile fashion. 1% Lidocaine was used for local anesthesia. Under ultrasound guidance a 6 Fr Safe-T-Centesis catheter was introduced. Thoracentesis was performed. The catheter was removed and a dressing applied. FINDINGS: A total of approximately 1650cc of red-tinged fluid was removed. IMPRESSION: Successful ultrasound guided right thoracentesis yielding 1650cc of pleural fluid. Read by Pasty Spillers, PA-C Electronically Signed   By: Ruthann Cancer M.D.   On: 11/29/2021 10:21     ASSESSMENT/PLAN:  This is a very pleasant 70 year old Caucasian male diagnosed with extensive stage (T4, N3, M1C) small cell lung cancer presented with large right upper lobe lung mass in addition to mediastinal and right supraclavicular lymphadenopathy and multiple metastatic liver lesions diagnosed in April 2021.   The patient initially underwent systemic chemotherapy with carboplatin for an AUC of 5 on day 1, etoposide 100 mg per metered squared on days 1, 2, and 3 in addition to [facility] for myeloprotection.  He also received immunotherapy with Imfinzi on day 1 of every chemotherapy cycle.  He is status post 9 cycles.  Starting from cycle #5 he was on single agent immunotherapy with Imfinzi IV every 4 weeks.  The patient had evidence of disease  progression on his scan from December 2021.  He was then started on systemic chemotherapy with carboplatin for an AUC of 5 on day 1, etoposide 100 mg per metered squared on days 1, 2, and 3 in addition to Tecentriq 1200 mg on day 1, the patient is status post 9 cycles.  Starting from cycle #5, the patient started maintenance immunotherapy with Tecentriq.  This was discontinued due to evidence of disease progression.  The patient had a restaging CT scan performed.  Dr. Julien Nordmann personally and independently reviewed the scan discussed results with the patient today.  The scan did not show any evidence of disease progression.  The liver lesions have decreased in size. There was some increase in prominent nodular soft tissue density today. Radiology commented that while this may be treatment related, close attention on follow-up recommended as recurrent disease not excluded. We will need to continue to monitor this closely though on follow-up imaging.    We will see the patient back for follow-up visit in 4 weeks for evaluation before starting cycle #11.  He will proceed with cycle #10 next week as scheduled.  He will continue to use Tussionex for his cough.  He is scheduled with pulmonology next week for consultation as the patient last month was interested in other options for his recurrent effusions. The patient is not sure anymore if he is interested in pleurx catheter placement. The patient is interested and has inquired about pulmonary rehab. Dr. Julien Nordmann recommended that the patient talk to pulmonology next week about recommendations regarding pleurx vs close monitoring of his recurrent effusions as well as pulmonary rehab.   The patient was advised to call immediately if he has any concerning symptoms in the interval. The patient voices understanding of current disease status and treatment options and is in agreement with the current care plan. All questions were answered. The patient knows to call  the clinic with any problems, questions or concerns. We can certainly see the patient much sooner if necessary  No orders of the defined types were placed in this encounter.   Cassandra L Heilingoetter, PA-C 12/05/21  ADDENDUM: Hematology/Oncology Attending: I had a face-to-face encounter with the patient today.  I reviewed his record, lab, scan and recommended his care plan.  This is a very pleasant 70 years old white male with extensive stage small cell lung cancer that was initially diagnosed in April 2021 status post several chemotherapy regimens including 2 courses of carboplatin, etoposide and immunotherapy initiated with Imfinzi then with Tecentriq discontinued secondary to disease progression. The patient is currently undergoing treatment with second line systemic chemotherapy with Zepzelca (lurbinectedin) 3.2 Mg/M2 every 3 weeks status post 9 cycles. The patient has been tolerating his treatment well with no concerning adverse effects except for occasional fatigue and intermittent shortness of breath secondary to recurrent right pleural effusion. The patient had repeat CT scan of the chest, abdomen pelvis performed recently.  I personally and independently reviewed the scan with the patient and his wife. His scan showed no concerning findings for disease progression and there was further improvement in some of the pulmonary lesions. I recommended for the patient to proceed with cycle #10 of his treatment today. We will see him back for follow-up visit in 3 weeks for evaluation before the next cycle of his treatment. For the recurrent right pleural effusion, the patient will see pulmonary medicine for consideration of Pleurx catheter placement. He was advised to call immediately if he has any other concerning symptoms in the interval. The total time spent in the appointment was 30 minutes. Disclaimer: This note was dictated with voice recognition software. Similar sounding words can  inadvertently be transcribed and may be missed upon review. Eilleen Kempf, MD 12/05/21

## 2021-12-06 ENCOUNTER — Ambulatory Visit (HOSPITAL_COMMUNITY): Payer: Medicare Other

## 2021-12-11 ENCOUNTER — Other Ambulatory Visit: Payer: Self-pay

## 2021-12-11 ENCOUNTER — Inpatient Hospital Stay: Payer: Medicare Other

## 2021-12-11 ENCOUNTER — Inpatient Hospital Stay: Payer: Medicare Other | Admitting: Physician Assistant

## 2021-12-11 VITALS — BP 139/88 | HR 88 | Temp 98.9°F | Resp 20 | Wt 291.5 lb

## 2021-12-11 DIAGNOSIS — R000 Tachycardia, unspecified: Secondary | ICD-10-CM | POA: Diagnosis not present

## 2021-12-11 DIAGNOSIS — Z95828 Presence of other vascular implants and grafts: Secondary | ICD-10-CM

## 2021-12-11 DIAGNOSIS — J90 Pleural effusion, not elsewhere classified: Secondary | ICD-10-CM | POA: Diagnosis not present

## 2021-12-11 DIAGNOSIS — C3411 Malignant neoplasm of upper lobe, right bronchus or lung: Secondary | ICD-10-CM

## 2021-12-11 DIAGNOSIS — Z5111 Encounter for antineoplastic chemotherapy: Secondary | ICD-10-CM | POA: Diagnosis not present

## 2021-12-11 DIAGNOSIS — C787 Secondary malignant neoplasm of liver and intrahepatic bile duct: Secondary | ICD-10-CM | POA: Diagnosis not present

## 2021-12-11 DIAGNOSIS — Z79899 Other long term (current) drug therapy: Secondary | ICD-10-CM | POA: Diagnosis not present

## 2021-12-11 LAB — CBC WITH DIFFERENTIAL (CANCER CENTER ONLY)
Abs Immature Granulocytes: 0.05 10*3/uL (ref 0.00–0.07)
Basophils Absolute: 0.1 10*3/uL (ref 0.0–0.1)
Basophils Relative: 1 %
Eosinophils Absolute: 0.1 10*3/uL (ref 0.0–0.5)
Eosinophils Relative: 2 %
HCT: 35.1 % — ABNORMAL LOW (ref 39.0–52.0)
Hemoglobin: 11.9 g/dL — ABNORMAL LOW (ref 13.0–17.0)
Immature Granulocytes: 1 %
Lymphocytes Relative: 16 %
Lymphs Abs: 0.7 10*3/uL (ref 0.7–4.0)
MCH: 30.7 pg (ref 26.0–34.0)
MCHC: 33.9 g/dL (ref 30.0–36.0)
MCV: 90.5 fL (ref 80.0–100.0)
Monocytes Absolute: 0.7 10*3/uL (ref 0.1–1.0)
Monocytes Relative: 17 %
Neutro Abs: 2.6 10*3/uL (ref 1.7–7.7)
Neutrophils Relative %: 63 %
Platelet Count: 238 10*3/uL (ref 150–400)
RBC: 3.88 MIL/uL — ABNORMAL LOW (ref 4.22–5.81)
RDW: 15.7 % — ABNORMAL HIGH (ref 11.5–15.5)
WBC Count: 4.2 10*3/uL (ref 4.0–10.5)
nRBC: 0.5 % — ABNORMAL HIGH (ref 0.0–0.2)

## 2021-12-11 LAB — CMP (CANCER CENTER ONLY)
ALT: 19 U/L (ref 0–44)
AST: 24 U/L (ref 15–41)
Albumin: 3.9 g/dL (ref 3.5–5.0)
Alkaline Phosphatase: 59 U/L (ref 38–126)
Anion gap: 6 (ref 5–15)
BUN: 16 mg/dL (ref 8–23)
CO2: 29 mmol/L (ref 22–32)
Calcium: 8.7 mg/dL — ABNORMAL LOW (ref 8.9–10.3)
Chloride: 102 mmol/L (ref 98–111)
Creatinine: 1.11 mg/dL (ref 0.61–1.24)
GFR, Estimated: >60 mL/min (ref >60)
Glucose, Bld: 176 mg/dL — ABNORMAL HIGH (ref 70–99)
Potassium: 4.1 mmol/L (ref 3.5–5.1)
Sodium: 137 mmol/L (ref 135–145)
Total Bilirubin: 0.4 mg/dL (ref 0.3–1.2)
Total Protein: 6.6 g/dL (ref 6.5–8.1)

## 2021-12-11 MED ORDER — PALONOSETRON HCL INJECTION 0.25 MG/5ML
0.2500 mg | Freq: Once | INTRAVENOUS | Status: AC
  Administered 2021-12-11: 15:00:00 0.25 mg via INTRAVENOUS
  Filled 2021-12-11: qty 5

## 2021-12-11 MED ORDER — SODIUM CHLORIDE 0.9 % IV SOLN: Freq: Once | INTRAVENOUS | Status: AC

## 2021-12-11 MED ORDER — SODIUM CHLORIDE 0.9 % IV SOLN
3.1400 mg/m2 | Freq: Once | INTRAVENOUS | Status: AC
  Administered 2021-12-11: 16:00:00 8 mg via INTRAVENOUS
  Filled 2021-12-11: qty 16

## 2021-12-11 MED ORDER — DEXAMETHASONE SODIUM PHOSPHATE 100 MG/10ML IJ SOLN
10.0000 mg | Freq: Once | INTRAMUSCULAR | Status: AC
  Administered 2021-12-11: 16:00:00 10 mg via INTRAVENOUS
  Filled 2021-12-11: qty 10

## 2021-12-11 MED ORDER — SODIUM CHLORIDE 0.9% FLUSH
10.0000 mL | INTRAVENOUS | Status: DC | PRN
  Administered 2021-12-11: 18:00:00 10 mL

## 2021-12-11 MED ORDER — HEPARIN SOD (PORK) LOCK FLUSH 100 UNIT/ML IV SOLN
500.0000 [IU] | Freq: Once | INTRAVENOUS | Status: AC | PRN
  Administered 2021-12-11: 18:00:00 500 [IU]

## 2021-12-11 MED ORDER — SODIUM CHLORIDE 0.9% FLUSH
10.0000 mL | Freq: Once | INTRAVENOUS | Status: AC
  Administered 2021-12-11: 15:00:00 10 mL

## 2021-12-11 NOTE — Patient Instructions (Signed)
[facility]  Discharge Instructions: Thank you for choosing [facility] to provide your oncology and hematology care.   If you have a lab appointment with the [facility], please go directly to the [facility] and check in at the registration area.   Wear comfortable clothing and clothing appropriate for easy access to any Portacath or PICC line.   We strive to give you quality time with your provider. You may need to reschedule your appointment if you arrive late (15 or more minutes).  Arriving late affects you and other patients whose appointments are after yours.  Also, if you miss three or more appointments without notifying the office, you may be dismissed from the clinic at the providers discretion.      For prescription refill requests, have your pharmacy contact our office and allow 72 hours for refills to be completed.    Today you received the following chemotherapy and/or immunotherapy agents: Zepzelca     To help prevent nausea and vomiting after your treatment, we encourage you to take your nausea medication as directed.  BELOW ARE SYMPTOMS THAT SHOULD BE REPORTED IMMEDIATELY: *FEVER GREATER THAN 100.4 F (38 C) OR HIGHER *CHILLS OR SWEATING *NAUSEA AND VOMITING THAT IS NOT CONTROLLED WITH YOUR NAUSEA MEDICATION *UNUSUAL SHORTNESS OF BREATH *UNUSUAL BRUISING OR BLEEDING *URINARY PROBLEMS (pain or burning when urinating, or frequent urination) *BOWEL PROBLEMS (unusual diarrhea, constipation, pain near the anus) TENDERNESS IN MOUTH AND THROAT WITH OR WITHOUT PRESENCE OF ULCERS (sore throat, sores in mouth, or a toothache) UNUSUAL RASH, SWELLING OR PAIN  UNUSUAL VAGINAL DISCHARGE OR ITCHING   Items with * indicate a potential emergency and should be followed up as soon as possible or go to the Emergency Department if any problems should occur.  Please show the CHEMOTHERAPY ALERT CARD or IMMUNOTHERAPY ALERT CARD at check-in to  the Emergency Department and triage nurse.  Should you have questions after your visit or need to cancel or reschedule your appointment, please contact [facility]  Dept: [phone]  and follow the prompts.  Office hours are 8:00 a.m. to 4:30 p.m. Monday - Friday. Please note that voicemails left after 4:00 p.m. may not be returned until the following business day.  We are closed weekends and major holidays. You have access to a nurse at all times for urgent questions. Please call the main number to the clinic Dept: [phone] and follow the prompts.   For any non-urgent questions, you may also contact your provider using MyChart. We now offer e-Visits for anyone 51 and older to request care online for non-urgent symptoms. For details visit [email or web address].   Also download the MyChart app! Go to the app store, search "MyChart", open the app, select [facility], and log in with your MyChart username and password.  Due to Covid, a mask is required upon entering the hospital/clinic. If you do not have a mask, one will be given to you upon arrival. For doctor visits, patients may have 1 support person aged 81 or older with them. For treatment visits, patients cannot have anyone with them due to current Covid guidelines and our immunocompromised population.

## 2021-12-13 ENCOUNTER — Encounter: Payer: Self-pay | Admitting: Pulmonary Disease

## 2021-12-13 ENCOUNTER — Ambulatory Visit (INDEPENDENT_AMBULATORY_CARE_PROVIDER_SITE_OTHER): Payer: Medicare Other | Admitting: Pulmonary Disease

## 2021-12-13 ENCOUNTER — Other Ambulatory Visit: Payer: Self-pay

## 2021-12-13 VITALS — BP 112/78 | HR 87 | Temp 97.8°F | Ht 69.0 in | Wt 294.0 lb

## 2021-12-13 DIAGNOSIS — C3411 Malignant neoplasm of upper lobe, right bronchus or lung: Secondary | ICD-10-CM

## 2021-12-13 DIAGNOSIS — J90 Pleural effusion, not elsewhere classified: Secondary | ICD-10-CM | POA: Diagnosis not present

## 2021-12-13 DIAGNOSIS — J449 Chronic obstructive pulmonary disease, unspecified: Secondary | ICD-10-CM | POA: Diagnosis not present

## 2021-12-13 MED ORDER — BREZTRI AEROSPHERE 160-9-4.8 MCG/ACT IN AERO: 2.0000 | INHALATION_SPRAY | Freq: Two times a day (BID) | RESPIRATORY_TRACT | 0 refills | Status: DC

## 2021-12-13 NOTE — Patient Instructions (Signed)
Thank you for visiting Dr. Valeta Harms at [facility]. Today we recommend the following:  Orders Placed This Encounter  Procedures   Ambulatory referral to Cardiothoracic Surgery   Breztri samples today. Referral placed to speak with cardiothoracic surgery regarding pleuroscopy and possible talc pleurodesis.  Return in about 3 months (around 03/13/2022) for w/ Dr. Valeta Harms .    Please do your part to reduce the spread of COVID-19.

## 2021-12-13 NOTE — Addendum Note (Signed)
Addended by: Fran Lowes on: 12/13/2021 11:49 AM   Modules accepted: Orders

## 2021-12-13 NOTE — Progress Notes (Signed)
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  Patient was completed palliative radiotherapy and systemic chemotherapy followed by Dr. Earlie Server.  Patient unfortunately has had a recurrent right-sided effusion.  He has had 6 thoracentesis in the past.  Patient was referred today to discuss possibility of Pleurx catheter placement or consideration for other options of recurrent pleural effusion.  He has a longtime history of tobacco use.  Currently using albuterol only.  Was placed on Anoro in the past with no significant improvement.  When you know if there is other inhaler options.   Past Medical History:  Diagnosis Date   Chickenpox    Chronic knee pain    Chronic low back pain    Coronary artery calcification seen on CAT scan    CT Chest 10/04/2021: Three-vessel coronary calcification noted.   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 He has disease progression after the first line of chemotherapy in December 2021.   Testosterone deficiency    Type 2 diabetes mellitus ([place])      Family History   Problem Relation Age of Onset   Heart attack Mother    Cancer Mother    Hypertension Mother    Arthritis Father    Asthma Father    Cancer Father    COPD Father    Heart attack Father    Hypertension Sister    Cancer Sister    Diabetes Sister    Asthma Sister    Cancer Sister    COPD Sister    Arthritis Sister    Asthma Sister    Diabetes Sister    Birth defects Maternal Grandfather    Arthritis Paternal Grandmother    Diabetes Paternal Grandmother    Arthritis Paternal Grandfather    Asthma Son    Other Neg Hx        pituitary disorder     Past Surgical History:  Procedure Laterality Date   COLONOSCOPY WITH PROPOFOL N/A 12/17/2018   Procedure: COLONOSCOPY WITH PROPOFOL;  Surgeon: Virgel Manifold, MD;  Location: [facility] ENDOSCOPY;  Service: Gastroenterology;  Laterality: N/A;   IR IMAGING GUIDED PORT INSERTION  04/17/2020   IR THORACENTESIS ASP PLEURAL SPACE W/IMG GUIDE  11/29/2021   JOINT REPLACEMENT Bilateral    REPLACEMENT TOTAL KNEE BILATERAL  2015   TONSILLECTOMY  1960   TRANSTHORACIC ECHOCARDIOGRAM  05/2020   a) 05/2020: EF 55 to 60%.  No R WMA.  Mild LVH.  Indeterminate LVEDP.  Unable to assess RVP.  Normal aortic and mitral valves.  Mildly elevated RAP.; b) 09/2021: EF 50-55%. No RWMA. Mild LVH. ~ LVEDP. Mild  LA Dilation. NORMAL RV/RAP.  Normal MV/AoV.    Social History   Socioeconomic History   Marital status: Married    Spouse name: Not on file   Number of children: Not on file   Years of education: Not on file   Highest education level: Not on file  Occupational History   Not on file  Tobacco Use   Smoking status: Former   Smokeless tobacco: Never  Vaping Use   Vaping Use: Never used  Substance and Sexual Activity   Alcohol use: Yes    Comment: rarely   Drug use: Not Currently   Sexual activity: Yes  Other Topics Concern   Not on file  Social History Narrative   Married.   Moved from [place].   Retired.   Social Determinants of Health    Financial Resource Strain: Low Risk    Difficulty of Paying Living Expenses: Not hard at all  Food Insecurity: No Food Insecurity   Worried About Charity fundraiser in the Last Year: Never true   [place] in the Last Year: Never true  Transportation Needs: No Transportation Needs   Lack of Transportation (Medical): No   Lack of Transportation (Non-Medical): No  Physical Activity: Inactive   Days of Exercise per Week: 0 days   Minutes of Exercise per Session: 0 min  Stress: No Stress Concern Present   Feeling of Stress : Not at all  Social Connections: Not on file  Intimate Partner Violence: Not At Risk   Fear of Current or Ex-Partner: No   Emotionally Abused: No   Physically Abused: No   Sexually Abused: No     Allergies  Allergen Reactions   Bupropion     Racing heart   Hydrochlorothiazide Other (See Comments)    Cramping to lower extremities     Outpatient Medications Prior to Visit  Medication Sig Dispense Refill   albuterol (VENTOLIN HFA) 108 (90 Base) MCG/ACT inhaler Inhale 2 puffs into the lungs every 6 (six) hours as needed for wheezing or shortness of breath. 18 g 0   amLODipine (NORVASC) 10 MG tablet TAKE 1 TABLET BY MOUTH EVERY DAY for blood pressure. (Patient taking differently: Take 10 mg by mouth daily.) 90 tablet 1   B-D 3CC LUER-LOK SYR 22GX1" 22G X 1" 3 ML MISC USE AS INSTRUCTED FOR TESTOSTERONE INJECTION EVERY 2 WEEKS 10 each 2   bisoprolol (ZEBETA) 5 MG tablet Take 1 tablet (5 mg total) by mouth daily. 30 tablet 6   Black Pepper-Turmeric (TURMERIC COMPLEX/BLACK PEPPER PO) Take 1 tablet by mouth in the morning and at bedtime.     blood glucose meter kit and supplies KIT Dispense based on patient and insurance preference. Use up to four times daily as directed. 1 each 0   chlorpheniramine-HYDROcodone (TUSSIONEX) 10-8 MG/5ML SUER Take 5 mLs by mouth at bedtime as needed for cough. 473 mL 0   ELIQUIS 5 MG TABS tablet TAKE 1 TABLET BY MOUTH TWICE A DAY  (Patient taking differently: Take 5 mg by mouth 2 (two) times daily.) 180 tablet 2   esomeprazole (NEXIUM) 20 MG capsule Take 20 mg by mouth daily.      gabapentin (NEURONTIN) 300 MG capsule Take 300 mg by mouth at bedtime.     glipiZIDE (GLUCOTROL) 10 MG tablet TAKE 1 TABLET (10 MG TOTAL) BY MOUTH 2 (TWO) TIMES DAILY BEFORE A MEAL. FOR DIABETES. 180 tablet 3   Insulin Glargine (BASAGLAR KWIKPEN) 100 UNIT/ML Inject  20 Units into the skin at bedtime. For diabetes. 15 mL 3   Insulin Pen Needle (BD PEN NEEDLE NANO U/F) 32G X 4 MM MISC Use with insulin as prescribed  Dx Code: E11.9 100 each 2   ivabradine (CORLANOR) 7.5 MG TABS tablet Take 2 tablets (15 mg) by mouth 2 hours prior to your Cardiac CT x 1 dose 2 tablet 0   Lancets MISC USE UP TO 3 TIMES DAILY AS DIRECTED 100 each 2   lidocaine-prilocaine (EMLA) cream Apply 1 application topically as needed. (Patient taking differently: Apply 1 application topically as needed (port access).) 30 g 2   losartan (COZAAR) 50 MG tablet Take 1 tablet (50 mg total) by mouth daily. For blood pressure. 90 tablet 3   Melatonin 10 MG CAPS Take 10 mg by mouth at bedtime.     metFORMIN (GLUCOPHAGE) 1000 MG tablet TAKE 1 TABLET (1,000 MG TOTAL) BY MOUTH 2 (TWO) TIMES DAILY WITH A MEAL. FOR DIABETES (Patient taking differently: Take 1,000 mg by mouth 2 (two) times daily with a meal.) 180 tablet 1   metoprolol tartrate (LOPRESSOR) 100 MG tablet Take 1 tablet (100 mg) by mouth 2 hours prior to your Cardiac CT x 1 dose 1 tablet 0   Multiple Vitamin (MULTI-VITAMINS) TABS Take 1 tablet by mouth daily.      Omega-3 Fatty Acids (SALMON OIL PO) Take 1 capsule by mouth daily.     ONETOUCH ULTRA test strip USE UP TO 4 TIMES DAILY AS DIRECTED 100 strip 5   oxymetazoline (AFRIN) 0.05 % nasal spray Place 1 spray into both nostrils 2 (two) times daily as needed for congestion.     pioglitazone (ACTOS) 45 MG tablet TAKE 1 TABLET (45 MG TOTAL) BY MOUTH DAILY. FOR DIABETES. (Patient  taking differently: Take 45 mg by mouth daily.) 90 tablet 0   pravastatin (PRAVACHOL) 40 MG tablet TAKE 1 TABLET BY MOUTH EVERY DAY IN THE EVENING FOR CHOLESTEROL (Patient taking differently: Take 40 mg by mouth daily.) 90 tablet 1   pseudoephedrine-acetaminophen (TYLENOL SINUS) 30-500 MG TABS tablet Take 2 tablets by mouth at bedtime.     testosterone cypionate (DEPOTESTOSTERONE CYPIONATE) 200 MG/ML injection Inject 200 mg into the muscle every 14 (fourteen) days.     ipratropium-albuterol (DUONEB) 0.5-2.5 (3) MG/3ML SOLN Take 3 mLs by nebulization every 4 (four) hours while awake for 3 days, THEN 3 mLs every 4 (four) hours as needed (shortness of breath or wheezing). 360 mL 0   No facility-administered medications prior to visit.    Review of Systems  Constitutional:  Negative for chills, fever, malaise/fatigue and weight loss.  HENT:  Negative for hearing loss, sore throat and tinnitus.   Eyes:  Negative for blurred vision and double vision.  Respiratory:  Positive for shortness of breath. Negative for cough, hemoptysis, sputum production, wheezing and stridor.   Cardiovascular:  Negative for chest pain, palpitations, orthopnea, leg swelling and PND.  Gastrointestinal:  Negative for abdominal pain, constipation, diarrhea, heartburn, nausea and vomiting.  Genitourinary:  Negative for dysuria, hematuria and urgency.  Musculoskeletal:  Negative for joint pain and myalgias.  Skin:  Negative for itching and rash.  Neurological:  Negative for dizziness, tingling, weakness and headaches.  Endo/Heme/Allergies:  Negative for environmental allergies. Does not bruise/bleed easily.  Psychiatric/Behavioral:  Negative for depression. The patient is not nervous/anxious and does not have insomnia.   All other systems reviewed and are negative.   Objective:  Physical Exam Vitals reviewed.  Constitutional:  General: He is not in acute distress.    Appearance: He is well-developed. He is obese.   HENT:     Head: Normocephalic and atraumatic.  Eyes:     General: No scleral icterus.    Conjunctiva/sclera: Conjunctivae normal.     Pupils: Pupils are equal, round, and reactive to light.  Neck:     Vascular: No JVD.     Trachea: No tracheal deviation.  Cardiovascular:     Rate and Rhythm: Normal rate and regular rhythm.     Heart sounds: Normal heart sounds. No murmur heard. Pulmonary:     Effort: Pulmonary effort is normal. No tachypnea, accessory muscle usage or respiratory distress.     Breath sounds: No stridor. No wheezing, rhonchi or rales.     Comments: Diminished/absent breath sounds in the right base. Abdominal:     General: There is no distension.     Palpations: Abdomen is soft.     Tenderness: There is no abdominal tenderness.  Musculoskeletal:        General: No tenderness.     Cervical back: Neck supple.  Lymphadenopathy:     Cervical: No cervical adenopathy.  Skin:    General: Skin is warm and dry.     Capillary Refill: Capillary refill takes less than 2 seconds.     Findings: No rash.  Neurological:     Mental Status: He is alert and oriented to person, place, and time.  Psychiatric:        Behavior: Behavior normal.     Vitals:   12/13/21 1033  BP: 112/78  Pulse: 87  Temp: 97.8 F (36.6 C)  TempSrc: Oral  SpO2: 95%  Weight: 294 lb (133.4 kg)  Height: $Remove'5\' 9"'ciqqwXH$  (1.753 m)   95% on RA BMI Readings from Last 3 Encounters:  12/13/21 43.42 kg/m  12/11/21 43.05 kg/m  12/05/21 42.68 kg/m   Wt Readings from Last 3 Encounters:  12/13/21 294 lb (133.4 kg)  12/11/21 291 lb 8 oz (132.2 kg)  12/05/21 289 lb (131.1 kg)     CBC    Component Value Date/Time   WBC 4.2 12/11/2021 1433   WBC 2.5 (L) 10/18/2021 1533   RBC 3.88 (L) 12/11/2021 1433   HGB 11.9 (L) 12/11/2021 1433   HCT 35.1 (L) 12/11/2021 1433   HCT 42.7 10/06/2017 1052   PLT 238 12/11/2021 1433   MCV 90.5 12/11/2021 1433   MCH 30.7 12/11/2021 1433   MCHC 33.9 12/11/2021 1433    RDW 15.7 (H) 12/11/2021 1433   LYMPHSABS 0.7 12/11/2021 1433   MONOABS 0.7 12/11/2021 1433   EOSABS 0.1 12/11/2021 1433   BASOSABS 0.1 12/11/2021 1433     Chest Imaging: Chest x-ray 11/29/2021: No significant effusion on the right following a thoracentesis, no pneumothorax.  Some scarring in the right base, density in the medial portion of the right upper lobe history of small cell with radiation. The patient's images have been independently reviewed by me.    Pulmonary Functions Testing Results: No flowsheet data found.  FeNO:   Pathology:   Echocardiogram:   Heart Catheterization:     Assessment & Plan:     ICD-10-CM   1. Recurrent pleural effusion  J90 Ambulatory referral to Cardiothoracic Surgery    2. Small cell lung cancer, right upper lobe (HCC)  C34.11     3. Recurrent right pleural effusion  J90     4. Malignant neoplasm of upper lobe of right lung (HCC)  C34.11  5. Chronic obstructive pulmonary disease, unspecified COPD type ([place])  J44.9       Discussion: This is a 70 year old gentleman, likely has COPD, history of stage IV small cell with a recurrent right-sided pleural effusion.  He has had this tapped 6 times in the past.  Here today to discuss possibility of placement of Pleurx catheter as well as potential new inhaler regimen.  Plan: New samples today given of Breztri. If he likes this inhaler happy to send new prescription in for him. Continue albuterol for shortness of breath and wheezing. As for the recurrent pleural effusion today in the office we discussed risk benefits and alternatives of consideration for indwelling pleural catheter placement.  We talked about the risk of infection as well as the management that would needed for daily drainage to help alleviate shortness of breath as well as hopeful auto pleurodesis with continued drainage.  We also discussed the possibility of seeing thoracic surgery for consideration for pleuroscopy and talc  pleurodesis. He is interested potentially in discussing talc pleurodesis with thoracic surgery which may be a more quicker definitive fix even though it may require a more invasive procedure versus having a indwelling pleural catheter placed. I did explain that if they decided against doing talk pleurodesis that we could easily place a Pleurx catheter for him to drain at home.  He is going to speak with surgery and then get back with [place] and if we need to do that we can. I think that the surgical option would potentially be appropriate in him since he does have a very good functional status and has done very well with his treatment so far. We appreciate the consultation.    Current Outpatient Medications:    albuterol (VENTOLIN HFA) 108 (90 Base) MCG/ACT inhaler, Inhale 2 puffs into the lungs every 6 (six) hours as needed for wheezing or shortness of breath., Disp: 18 g, Rfl: 0   amLODipine (NORVASC) 10 MG tablet, TAKE 1 TABLET BY MOUTH EVERY DAY for blood pressure. (Patient taking differently: Take 10 mg by mouth daily.), Disp: 90 tablet, Rfl: 1   B-D 3CC LUER-LOK SYR 22GX1" 22G X 1" 3 ML MISC, USE AS INSTRUCTED FOR TESTOSTERONE INJECTION EVERY 2 WEEKS, Disp: 10 each, Rfl: 2   bisoprolol (ZEBETA) 5 MG tablet, Take 1 tablet (5 mg total) by mouth daily., Disp: 30 tablet, Rfl: 6   Black Pepper-Turmeric (TURMERIC COMPLEX/BLACK PEPPER PO), Take 1 tablet by mouth in the morning and at bedtime., Disp: , Rfl:    blood glucose meter kit and supplies KIT, Dispense based on patient and insurance preference. Use up to four times daily as directed., Disp: 1 each, Rfl: 0   chlorpheniramine-HYDROcodone (TUSSIONEX) 10-8 MG/5ML SUER, Take 5 mLs by mouth at bedtime as needed for cough., Disp: 473 mL, Rfl: 0   ELIQUIS 5 MG TABS tablet, TAKE 1 TABLET BY MOUTH TWICE A DAY (Patient taking differently: Take 5 mg by mouth 2 (two) times daily.), Disp: 180 tablet, Rfl: 2   esomeprazole (NEXIUM) 20 MG capsule, Take 20 mg by  mouth daily. , Disp: , Rfl:    gabapentin (NEURONTIN) 300 MG capsule, Take 300 mg by mouth at bedtime., Disp: , Rfl:    glipiZIDE (GLUCOTROL) 10 MG tablet, TAKE 1 TABLET (10 MG TOTAL) BY MOUTH 2 (TWO) TIMES DAILY BEFORE A MEAL. FOR DIABETES., Disp: 180 tablet, Rfl: 3   Insulin Glargine (BASAGLAR KWIKPEN) 100 UNIT/ML, Inject 20 Units into the skin at bedtime. For diabetes., Disp:  15 mL, Rfl: 3   Insulin Pen Needle (BD PEN NEEDLE NANO U/F) 32G X 4 MM MISC, Use with insulin as prescribed  Dx Code: E11.9, Disp: 100 each, Rfl: 2   ivabradine (CORLANOR) 7.5 MG TABS tablet, Take 2 tablets (15 mg) by mouth 2 hours prior to your Cardiac CT x 1 dose, Disp: 2 tablet, Rfl: 0   Lancets MISC, USE UP TO 3 TIMES DAILY AS DIRECTED, Disp: 100 each, Rfl: 2   lidocaine-prilocaine (EMLA) cream, Apply 1 application topically as needed. (Patient taking differently: Apply 1 application topically as needed (port access).), Disp: 30 g, Rfl: 2   losartan (COZAAR) 50 MG tablet, Take 1 tablet (50 mg total) by mouth daily. For blood pressure., Disp: 90 tablet, Rfl: 3   Melatonin 10 MG CAPS, Take 10 mg by mouth at bedtime., Disp: , Rfl:    metFORMIN (GLUCOPHAGE) 1000 MG tablet, TAKE 1 TABLET (1,000 MG TOTAL) BY MOUTH 2 (TWO) TIMES DAILY WITH A MEAL. FOR DIABETES (Patient taking differently: Take 1,000 mg by mouth 2 (two) times daily with a meal.), Disp: 180 tablet, Rfl: 1   metoprolol tartrate (LOPRESSOR) 100 MG tablet, Take 1 tablet (100 mg) by mouth 2 hours prior to your Cardiac CT x 1 dose, Disp: 1 tablet, Rfl: 0   Multiple Vitamin (MULTI-VITAMINS) TABS, Take 1 tablet by mouth daily. , Disp: , Rfl:    Omega-3 Fatty Acids (SALMON OIL PO), Take 1 capsule by mouth daily., Disp: , Rfl:    ONETOUCH ULTRA test strip, USE UP TO 4 TIMES DAILY AS DIRECTED, Disp: 100 strip, Rfl: 5   oxymetazoline (AFRIN) 0.05 % nasal spray, Place 1 spray into both nostrils 2 (two) times daily as needed for congestion., Disp: , Rfl:    pioglitazone  (ACTOS) 45 MG tablet, TAKE 1 TABLET (45 MG TOTAL) BY MOUTH DAILY. FOR DIABETES. (Patient taking differently: Take 45 mg by mouth daily.), Disp: 90 tablet, Rfl: 0   pravastatin (PRAVACHOL) 40 MG tablet, TAKE 1 TABLET BY MOUTH EVERY DAY IN THE EVENING FOR CHOLESTEROL (Patient taking differently: Take 40 mg by mouth daily.), Disp: 90 tablet, Rfl: 1   pseudoephedrine-acetaminophen (TYLENOL SINUS) 30-500 MG TABS tablet, Take 2 tablets by mouth at bedtime., Disp: , Rfl:    testosterone cypionate (DEPOTESTOSTERONE CYPIONATE) 200 MG/ML injection, Inject 200 mg into the muscle every 14 (fourteen) days., Disp: , Rfl:    ipratropium-albuterol (DUONEB) 0.5-2.5 (3) MG/3ML SOLN, Take 3 mLs by nebulization every 4 (four) hours while awake for 3 days, THEN 3 mLs every 4 (four) hours as needed (shortness of breath or wheezing)., Disp: 360 mL, Rfl: 0  I spent 62 minutes dedicated to the care of this patient on the date of this encounter to include pre-visit review of records, face-to-face time with the patient discussing conditions above, post visit ordering of testing, clinical documentation with the electronic health record, making appropriate referrals as documented, and communicating necessary findings to members of the patients care team.   Garner Nash, DO [facility] 12/13/2021 10:39 AM

## 2021-12-18 ENCOUNTER — Other Ambulatory Visit: Payer: Self-pay | Admitting: Thoracic Surgery (Cardiothoracic Vascular Surgery)

## 2021-12-18 ENCOUNTER — Ambulatory Visit (INDEPENDENT_AMBULATORY_CARE_PROVIDER_SITE_OTHER): Payer: Medicare Other | Admitting: Primary Care

## 2021-12-18 ENCOUNTER — Encounter: Payer: Self-pay | Admitting: Primary Care

## 2021-12-18 ENCOUNTER — Other Ambulatory Visit: Payer: Self-pay

## 2021-12-18 VITALS — BP 132/88 | HR 84 | Temp 98.2°F | Ht 69.0 in | Wt 294.0 lb

## 2021-12-18 DIAGNOSIS — E1165 Type 2 diabetes mellitus with hyperglycemia: Secondary | ICD-10-CM | POA: Diagnosis not present

## 2021-12-18 DIAGNOSIS — C3411 Malignant neoplasm of upper lobe, right bronchus or lung: Secondary | ICD-10-CM

## 2021-12-18 DIAGNOSIS — R0609 Other forms of dyspnea: Secondary | ICD-10-CM

## 2021-12-18 DIAGNOSIS — Z794 Long term (current) use of insulin: Secondary | ICD-10-CM | POA: Diagnosis not present

## 2021-12-18 DIAGNOSIS — J90 Pleural effusion, not elsewhere classified: Secondary | ICD-10-CM

## 2021-12-18 LAB — POCT GLYCOSYLATED HEMOGLOBIN (HGB A1C): Hemoglobin A1C: 7 % — AB (ref 4.0–5.6)

## 2021-12-18 NOTE — Patient Instructions (Signed)
Continue your insulin daily, continue all other diabetes medications.  Schedule a follow up visit for 6 months for general follow up.  It was a pleasure to see you today!

## 2021-12-18 NOTE — Assessment & Plan Note (Signed)
Evaluated by cardiology, appreciate the thorough work up. CT coronary scan reviewed. He may be pending right and left heart catheter, this was not discussed today as he has a follow up visit scheduled for tomorrow with cardiology.  He is pending potential surgical option for recurrent right pleural effusion that has returned. Suspect this is the major cause of symptoms a long with small cell lung cancer.  He appears stale today, but does have obvious diminished sounds to right lung base.

## 2021-12-18 NOTE — Progress Notes (Signed)
[address]       [place] [address]             [phone]                    Conal [facility]alls [place] Medical Record #[id number] Date of Birth: Apr 19, 1951  Referring: Garner Nash, DO Primary Care: [name], NP Primary Cardiologist: Glenetta Hew, MD  Chief Complaint:    Chief Complaint  Patient presents with   Pleural Effusion    Surgical consult, CXR today    History of Present Illness:    Adam Wise 71 y.o. 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  This was involving the right upper lobe.  He went chemotherapy and radiation, and has had recurrence of right-sided effusions.  He is undergone thoracentesis in the 6 encounters comes in today to talk surgical treatment for his recurrent effusion.  He is short of breath states that fluid is coming back at a faster rate.      Zubrod Score: At the time of surgery this patients most appropriate activity status/level should be described as: _0     0    Normal activity, no symptoms _1     1    Restricted in physical strenuous activity but ambulatory, able to do out light work _2     2    Ambulatory and capable of self care, unable to do work activities, up and about               >50 % of waking hours                              _3     3    Only limited self care, in bed greater than 50% of waking hours _4     4    Completely disabled, no self care, confined to bed or chair _5     5    Moribund   Past Medical History:  Diagnosis Date   Chickenpox    Chronic knee pain    Chronic low back pain    Coronary artery calcification seen on CAT scan 11/2021   Coronary CTA 11/27/2021: Moderate to large right pleural effusion and compressive atelectasis right lung base. => Coronary Calcium Score 657.  Diffuse RCA calcification.  Minimal mild disease in the LAD and diagonal branches. == Overall limited study.  Notable artifact.   Essential hypertension     GERD (gastroesophageal reflux disease)    Hyperlipidemia    OSA (obstructive sleep apnea)    With nighttime oxygen supplementation   T4, M3, M1 C Metastatic Small Cell Lung Cancer 03/2020   large right upper lobe/right hilar mass with ipsilateral and contralateral mediastinal and right supraclavicular lymphadenopathy in addition to multiple liver lesios. He has disease progression after the first line of chemotherapy in December 2021.   Testosterone deficiency    Type 2 diabetes mellitus ([place])     Past Surgical History:  Procedure Laterality Date   COLONOSCOPY WITH PROPOFOL N/A 12/17/2018   Procedure: COLONOSCOPY WITH PROPOFOL;  Surgeon: Virgel Manifold, MD;  Location: [facility] ENDOSCOPY;  Service: Gastroenterology;  Laterality: N/A;   IR IMAGING GUIDED PORT INSERTION  04/17/2020   IR THORACENTESIS ASP PLEURAL SPACE W/IMG GUIDE  11/29/2021   JOINT REPLACEMENT Bilateral    REPLACEMENT TOTAL KNEE BILATERAL  2015  TONSILLECTOMY  1960   TRANSTHORACIC ECHOCARDIOGRAM  05/2020   a) 05/2020: EF 55 to 60%.  No R WMA.  Mild LVH.  Indeterminate LVEDP.  Unable to assess RVP.  Normal aortic and mitral valves.  Mildly elevated RAP.; b) 09/2021: EF 50-55%. No RWMA. Mild LVH. ~ LVEDP. Mild LA Dilation. NORMAL RV/RAP.  Normal MV/AoV.    Family History  Problem Relation Age of Onset   Heart attack Mother    Cancer Mother    Hypertension Mother    Arthritis Father    Asthma Father    Cancer Father    COPD Father    Heart attack Father    Hypertension Sister    Cancer Sister    Diabetes Sister    Asthma Sister    Cancer Sister    COPD Sister    Arthritis Sister    Asthma Sister    Diabetes Sister    Birth defects Maternal Grandfather    Arthritis Paternal Grandmother    Diabetes Paternal Grandmother    Arthritis Paternal Grandfather    Asthma Son    Other Neg Hx        pituitary disorder     Social History   Tobacco Use  Smoking Status Former  Smokeless Tobacco Never    Social  History   Substance and Sexual Activity  Alcohol Use Yes   Comment: rarely     Allergies  Allergen Reactions   Bupropion     Racing heart   Hydrochlorothiazide Other (See Comments)    Cramping to lower extremities    Current Outpatient Medications  Medication Sig Dispense Refill   albuterol (VENTOLIN HFA) 108 (90 Base) MCG/ACT inhaler Inhale 2 puffs into the lungs every 6 (six) hours as needed for wheezing or shortness of breath. 18 g 0   amLODipine (NORVASC) 10 MG tablet TAKE 1 TABLET BY MOUTH EVERY DAY for blood pressure. 90 tablet 1   B-D 3CC LUER-LOK SYR 22GX1" 22G X 1" 3 ML MISC USE AS INSTRUCTED FOR TESTOSTERONE INJECTION EVERY 2 WEEKS 10 each 2   bisoprolol (ZEBETA) 5 MG tablet Take 1 tablet (5 mg total) by mouth daily. 30 tablet 6   Black Pepper-Turmeric (TURMERIC COMPLEX/BLACK PEPPER PO) Take 1 tablet by mouth in the morning and at bedtime.     blood glucose meter kit and supplies KIT Dispense based on patient and insurance preference. Use up to four times daily as directed. 1 each 0   Budeson-Glycopyrrol-Formoterol (BREZTRI AEROSPHERE) 160-9-4.8 MCG/ACT AERO Inhale 2 puffs into the lungs in the morning and at bedtime. 10.7 g 0   ELIQUIS 5 MG TABS tablet TAKE 1 TABLET BY MOUTH TWICE A DAY 180 tablet 2   esomeprazole (NEXIUM) 20 MG capsule Take 20 mg by mouth daily.      gabapentin (NEURONTIN) 300 MG capsule Take 300 mg by mouth at bedtime.     glipiZIDE (GLUCOTROL) 10 MG tablet TAKE 1 TABLET (10 MG TOTAL) BY MOUTH 2 (TWO) TIMES DAILY BEFORE A MEAL. FOR DIABETES. 180 tablet 3   Insulin Glargine (BASAGLAR KWIKPEN) 100 UNIT/ML Inject 20 Units into the skin at bedtime. For diabetes. 15 mL 3   Insulin Pen Needle (BD PEN NEEDLE NANO U/F) 32G X 4 MM MISC Use with insulin as prescribed  Dx Code: E11.9 100 each 2   ipratropium-albuterol (DUONEB) 0.5-2.5 (3) MG/3ML SOLN Take 3 mLs by nebulization every 4 (four) hours while awake for 3 days, THEN 3 mLs every 4 (four)  hours as needed  (shortness of breath or wheezing). 360 mL 0   Lancets MISC USE UP TO 3 TIMES DAILY AS DIRECTED 100 each 2   lidocaine-prilocaine (EMLA) cream Apply 1 application topically as needed. 30 g 2   losartan (COZAAR) 50 MG tablet Take 1 tablet (50 mg total) by mouth daily. For blood pressure. 90 tablet 3   Melatonin 10 MG CAPS Take 10 mg by mouth at bedtime.     metFORMIN (GLUCOPHAGE) 1000 MG tablet TAKE 1 TABLET (1,000 MG TOTAL) BY MOUTH 2 (TWO) TIMES DAILY WITH A MEAL. FOR DIABETES 180 tablet 1   Multiple Vitamin (MULTI-VITAMINS) TABS Take 1 tablet by mouth daily.      Omega-3 Fatty Acids (SALMON OIL PO) Take 1 capsule by mouth daily.     ONETOUCH ULTRA test strip USE UP TO 4 TIMES DAILY AS DIRECTED 100 strip 5   oxymetazoline (AFRIN) 0.05 % nasal spray Place 1 spray into both nostrils 2 (two) times daily as needed for congestion.     pioglitazone (ACTOS) 45 MG tablet TAKE 1 TABLET (45 MG TOTAL) BY MOUTH DAILY. FOR DIABETES. 90 tablet 0   pravastatin (PRAVACHOL) 40 MG tablet TAKE 1 TABLET BY MOUTH EVERY DAY IN THE EVENING FOR CHOLESTEROL 90 tablet 1   pseudoephedrine-acetaminophen (TYLENOL SINUS) 30-500 MG TABS tablet Take 2 tablets by mouth at bedtime.     testosterone cypionate (DEPOTESTOSTERONE CYPIONATE) 200 MG/ML injection Inject 200 mg into the muscle every 14 (fourteen) days.     No current facility-administered medications for this visit.    Review of Systems  Respiratory:  Positive for cough and shortness of breath.   Cardiovascular:  Negative for chest pain.    PHYSICAL EXAMINATION: BP 135/85    Pulse 86    Resp 20    Ht _0  (1.753 m)    Wt 293 lb (132.9 kg)    SpO2 90%    BMI 43.27 kg/m  Physical Exam Constitutional:      Appearance: He is obese.  Cardiovascular:     Rate and Rhythm: Normal rate.  Pulmonary:     Effort: Pulmonary effort is normal.  Musculoskeletal:     Cervical back: Normal range of motion.  Neurological:     Mental Status: He is alert.    Diagnostic  Studies & Laboratory data:     Recent Radiology Findings:   DG Chest 1 View  Result Date: 11/29/2021 CLINICAL DATA:  Post right thoracentesis. EXAM: CHEST  1 VIEW COMPARISON:  10/12/2021 FINDINGS: Chronic volume loss in the right hemithorax. Chronic scarring or fibrotic changes along the medial right lung. Negative for pneumothorax. Left lung is clear. Heart size is upper limits of normal but stable. Right jugular Port-A-Cath with the tip near the superior cavoatrial junction. IMPRESSION: 1. Negative for pneumothorax following right thoracentesis. 2. Chronic volume loss with scarring in the right lung. Electronically Signed   By: Markus Daft M.D.   On: 11/29/2021 10:18   DG Chest 2 View  Result Date: 12/20/2021 CLINICAL DATA:  71 year old male with small cell lung cancer status post chemo radiation. EXAM: CHEST - 2 VIEW COMPARISON:  Portable chest 11/29/2021 and earlier. FINDINGS: PA and lateral views. Stable right chest power port. Increasing dense and veiling opacity in the right lower lung compatible with some increase in the known right pleural effusion since last month. Superimposed extensive architectural distortion extending from the right hilum into the lung apex is stable. No superimposed pneumothorax or pulmonary  edema. Other mediastinal contours are stable, within normal limits. Left lung appears stable and negative. Visualized tracheal air column is within normal limits. Stable visualized osseous structures. Negative visible bowel gas. IMPRESSION: 1. Increasing right pleural effusion since restaging CTs last month. 2. Otherwise stable post treatment appearance of the chest. Electronically Signed   By: Genevie Ann M.D.   On: 12/20/2021 09:01   CT CHEST ABDOMEN PELVIS W CONTRAST  Result Date: 11/28/2021 CLINICAL DATA:  Small-cell lung cancer.  Restaging. EXAM: CT CHEST, ABDOMEN, AND PELVIS WITH CONTRAST TECHNIQUE: Multidetector CT imaging of the chest, abdomen and pelvis was performed following  the standard protocol during bolus administration of intravenous contrast. CONTRAST:  148m OMNIPAQUE IOHEXOL 350 MG/ML SOLN COMPARISON:  Chest abdomen pelvis CT 10/04/2021. CTA Chest 10/11/2021 FINDINGS: CT CHEST FINDINGS Cardiovascular: The heart size is normal. No substantial pericardial effusion. Coronary artery calcification is evident. Mild atherosclerotic calcification is noted in the wall of the thoracic aorta. Right Port-A-Cath tip is positioned at the SVC/RA junction. Mediastinum/Nodes: No mediastinal lymphadenopathy. There is no hilar lymphadenopathy. The esophagus has normal imaging features. There is no axillary lymphadenopathy. Lungs/Pleura: Similar appearance of volume loss and post radiation scarring in the right parahilar and suprahilar lung. Some areas show more prominent soft tissue density today including 1.8 x 1.2 cm nodular focus on image 39/5. 10 mm right upper lobe nodule on 34/5 is new in the interval. Right pleural effusion is slightly progressive in the interval now moderate in size. No new suspicious nodule or mass in the left lung. No left pleural effusion. Musculoskeletal: No worrisome lytic or sclerotic osseous abnormality. CT ABDOMEN PELVIS FINDINGS Hepatobiliary: Multiple low-density liver lesions previously described as metastatic disease have decreased in the interval. Index lesion inferior left liver on 52/3 measures 10 mm today, decreased from 20 mm (remeasured) on the previous exam. Subcapsular lesion in the right liver measuring 16 mm on image 57/3 today was 36 mm previously (remeasured). 9 mm lesion towards the dome of the liver on 46/3 was 48 mm previously (remeasured). There is no evidence for gallstones, gallbladder wall thickening, or pericholecystic fluid. No intrahepatic or extrahepatic biliary dilation. Pancreas: No focal mass lesion. No dilatation of the main duct. No intraparenchymal cyst. No peripancreatic edema. Spleen: No splenomegaly. No focal mass lesion.  Adrenals/Urinary Tract: Left adrenal gland unremarkable stable 13 mm right adrenal nodule. Multiple right renal cysts are similar to prior. Left kidney unremarkable. No evidence for hydroureter. The urinary bladder appears normal for the degree of distention. Stomach/Bowel: Stomach is unremarkable. No gastric wall thickening. No evidence of outlet obstruction. Duodenum is normally positioned as is the ligament of Treitz. Duodenal diverticulum noted. No small bowel wall thickening. No small bowel dilatation. The terminal ileum is normal. The appendix is normal. No gross colonic mass. No colonic wall thickening. Vascular/Lymphatic: There is mild atherosclerotic calcification of the abdominal aorta without aneurysm. There is no gastrohepatic or hepatoduodenal ligament lymphadenopathy. No retroperitoneal or mesenteric lymphadenopathy. No pelvic sidewall lymphadenopathy. Reproductive: The prostate gland is mildly enlarged. Seminal vesicles are unremarkable. Other: No intraperitoneal free fluid. Musculoskeletal: No worrisome lytic or sclerotic osseous abnormality. IMPRESSION: 1. Similar appearance of volume loss and post radiation scarring in the right parahilar and suprahilar lung. Some areas show more prominent nodular soft tissue density today. While this may be treatment related, close attention on follow-up recommended as recurrent disease not excluded. 2. Slight progression of right pleural effusion, now moderate in size. 3. Clear interval decrease in size of multiple hepatic  metastases. 4. Stable small right adrenal nodule. 5. Aortic Atherosclerosis (ICD10-I70.0). Electronically Signed   By: Misty Stanley M.D.   On: 11/28/2021 13:08   CT CORONARY MORPH W/CTA COR W/SCORE W/CA W/CM &/OR WO/CM  Addendum Date: 11/27/2021   ADDENDUM REPORT: 11/27/2021 16:44 EXAM: OVER-READ INTERPRETATION  CT CHEST The following report is an over-read performed by radiologist Dr. Eben Burow [facility] on 11/27/2021.  This over-read does not include interpretation of cardiac or coronary anatomy or pathology. The coronary calcium score/coronary CTA interpretation by the cardiologist is attached. COMPARISON:  Chest CT on 10/11/2021 FINDINGS: A moderate to large right pleural effusion is seen, but remains stable since previous study. Compressive atelectasis seen in the right lung base. Stable pulmonary opacity seen in the right paramediastinal lung zone with mild bronchiectasis, consistent with post radiation changes. Visualized portion of left lung is clear. The visualized portions of the mediastinum and chest wall are unremarkable. IMPRESSION: Stable moderate to large right pleural effusion, and compressive atelectasis in right lung base. Stable post radiation changes in right paramediastinal lung zone. Electronically Signed   By: Marlaine Hind M.D.   On: 11/27/2021 16:44   Result Date: 11/27/2021 HISTORY: 71 yo male with shortness of breath, coronary artery calcification EXAM: Cardiac/Coronary CTA TECHNIQUE: The patient was scanned on a [employer]. PROTOCOL: A 120 kV prospective scan was triggered in the descending thoracic aorta at 111 HU's. Axial non-contrast 3 mm slices were carried out through the heart. The data set was analyzed on a dedicated work station and scored using the Mullan. Gantry rotation speed was 250 msecs and collimation was .6 mm. Beta blockade and 0.8 mg of sl NTG was given. The 3D data set was reconstructed in 5% intervals of the 35-75 % of the R-R cycle. Diastolic phases were analyzed on a dedicated work station using MPR, MIP and VRT modes. The patient received 100 mL of contrast. FINDINGS: Quality: Poor, motion artifact, HR 74 Coronary calcium score: The patient's coronary artery calcium score is 657, which places the patient in the 77th percentile. Coronary arteries: Normal coronary origins.  Right dominance. Right Coronary Artery: Dominant.  Diffuse calcification. Left Main  Coronary Artery: Normal. Bifurcates into the LAD and LCx arteries. Left Anterior Descending Coronary Artery: Large anterior vessel that reaches the apex - there is minimal to mild 24-49% proximal stenosis (CADRADS2). Large D1 branch with minimal 1-24% mixed stenosis (CADRADS1). Left Circumflex Artery: Aorta: Normal size, 35 mm at the mid ascending aorta (level of the PA bifurcation) measured double oblique. Aortic atherosclerosis. No dissection. Aortic Valve: Trileaflet. No calcifications. Other findings: Normal pulmonary vein drainage into the left atrium. Normal left atrial appendage without a thrombus. Dilated main pulmonary artery at 37 mm, suggestive of pulmonary hypertension. Catheter noted in the SVC. IMPRESSION: 1. 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 2. Coronary calcium score of 657. This was 77th percentile for age and sex matched control. 3. Normal coronary origin with right dominance. 4. Dilated main pulmonary artery at 37 mm, suggestive of pulmonary hypertension. 5. Catheter noted in the SVC. 6. Aortic atherosclerosis. Electronically Signed: By: Pixie Casino M.D. On: 11/27/2021 14:56   IR THORACENTESIS ASP PLEURAL SPACE W/IMG GUIDE  Result Date: 11/29/2021 INDICATION: History of lung cancer, recurrent pleural effusion EXAM: ULTRASOUND GUIDED RIGHT THORACENTESIS MEDICATIONS: None. COMPLICATIONS: None immediate. PROCEDURE: An ultrasound guided thoracentesis was thoroughly discussed with the patient and questions answered. The benefits, risks, alternatives and complications were also discussed.  The patient understands and wishes to proceed with the procedure. Written consent was obtained. Ultrasound was performed to localize and mark an adequate pocket of fluid in the right chest. The area was then prepped and draped in the normal sterile fashion. 1% Lidocaine was used for local anesthesia. Under ultrasound guidance a 6 Fr Safe-T-Centesis catheter was  introduced. Thoracentesis was performed. The catheter was removed and a dressing applied. FINDINGS: A total of approximately 1650cc of red-tinged fluid was removed. IMPRESSION: Successful ultrasound guided right thoracentesis yielding 1650cc of pleural fluid. Read by Pasty Spillers, PA-C Electronically Signed   By: Ruthann Cancer M.D.   On: 11/29/2021 10:21       I have independently reviewed the above radiology studies  and reviewed the findings with the patient.   Recent Lab Findings: Lab Results  Component Value Date   WBC 4.2 12/11/2021   HGB 11.9 (L) 12/11/2021   HCT 35.1 (L) 12/11/2021   PLT 238 12/11/2021   GLUCOSE 176 (H) 12/11/2021   CHOL 131 08/13/2021   TRIG 209.0 (H) 08/13/2021   HDL 44.30 08/13/2021   LDLDIRECT 66.0 08/13/2021   LDLCALC 57 11/01/2019   ALT 19 12/11/2021   AST 24 12/11/2021   NA 137 12/11/2021   K 4.1 12/11/2021   CL 102 12/11/2021   CREATININE 1.11 12/11/2021   BUN 16 12/11/2021   CO2 29 12/11/2021   TSH 0.500 06/26/2021   INR 1.0 10/11/2021   HGBA1C 7.0 (A) 12/18/2021       Assessment / Plan:   71 year old male with stage IV small cell  cancer.  He has recentely completed palliative radiation.  He presents with a recurrent right-sided pleural effusion that has undergone thoracentesis on 6 separate occasions.  I personally reviewed his cross-sectional imaging.  Due to the radiation changes to his right lung, surgical intervention be high risk.  I would recommend Pleurx catheter placement by pulmonary medicine.  He would like some time to consider his options.  He will call us back in this decision.     I  spent 40 minutes with  the patient face to face in counseling and coordination of care.    Lajuana Matte 12/20/2021 3:50 PM

## 2021-12-18 NOTE — Assessment & Plan Note (Signed)
Improved and much better with A1C of 7.0 today!  Continue Basaglar 20 units HS, Glipizide 10 mg once daily, metformin 1000 mg BID, Actos 45 mg daily.   Foot exam UTD. Managed on statin and ARB. Pneumonia vaccine UTD.   Follow up in 6 months.

## 2021-12-18 NOTE — Progress Notes (Signed)
Subjective:    Patient ID: Adam Wise, male    DOB: 04-30-1951, 71 y.o.   MRN: [id number]  HPI  Adam Wise is a very pleasant 71 y.o. male with a history of small cell lung caner, hypertension, CAD, type 2 diabetes, hyperlipidemia, hypogonadism, OSA, COPD, recurrent pleural effusion who presents today for follow up of diabetes.  Current medications include: Glipizide 10 mg BID, Actos 45 mg daily, metformin 1000 mg BID, Basaglar 20 units HS. He's actually taking Glipizide once daily due to levels below.   He is checking his blood glucose 2 times daily and is getting readings of: 110's in the morning, low 200's at bedtime, eats a late dinner. He does have steroid infusions every [redacted] weeks along with his cancer treatment.   Last A1C: 8.1 in August 2022, 7.0 today Last Eye Exam: Due Last Foot Exam: UTD Pneumonia Vaccination: 2019 Urine Microalbumin: None. Managed on ARB Statin: pravastatin   Dietary changes since last visit: None   Exercise: None   BP Readings from Last 3 Encounters:  12/18/21 132/88  12/13/21 112/78  12/11/21 139/88      Review of Systems  Respiratory:         Chronic dyspnea, waxes and wanes.  Cardiovascular:  Negative for chest pain.  Musculoskeletal:  Positive for arthralgias.  Neurological:  Positive for numbness.       Numbness to finger tips after chemotherapy         Past Medical History:  Diagnosis Date   Chickenpox    Chronic knee pain    Chronic low back pain    Coronary artery calcification seen on CAT scan    CT Chest 10/04/2021: Three-vessel coronary calcification noted.   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 He has disease progression after the first line of chemotherapy in December 2021.   Testosterone deficiency    Type 2 diabetes mellitus ([place])     Social History   Socioeconomic History   Marital status: Married    Spouse name: Not on file   Number of children: Not on file   Years of education: Not on file   Highest education level: Not on file  Occupational History   Not on file  Tobacco Use   Smoking status: Former   Smokeless tobacco: Never  Vaping Use   Vaping Use: Never used  Substance and Sexual Activity   Alcohol use: Yes    Comment: rarely   Drug use: Not Currently   Sexual activity: Yes  Other Topics Concern   Not on file  Social History Narrative   Married.   Moved from [place].   Retired.   Social Determinants of Health   Financial Resource Strain: Low Risk    Difficulty of Paying Living Expenses: Not hard at all  Food Insecurity: No Food Insecurity   Worried About Charity fundraiser in the Last Year: Never true   [place] in the Last Year: Never true  Transportation Needs: No Transportation Needs   Lack of Transportation (Medical): No   Lack of Transportation (Non-Medical): No  Physical Activity: Inactive   Days of Exercise per Week: 0  days   Minutes of Exercise per Session: 0 min  Stress: No Stress Concern Present   Feeling of Stress : Not at all  Social Connections: Not on file  Intimate Partner Violence: Not At Risk   Fear of Current or Ex-Partner: No   Emotionally Abused: No   Physically Abused: No   Sexually Abused: No    Past Surgical History:  Procedure Laterality Date   COLONOSCOPY WITH PROPOFOL N/A 12/17/2018   Procedure: COLONOSCOPY WITH PROPOFOL;  Surgeon: Virgel Manifold, MD;  Location: [facility] ENDOSCOPY;  Service: Gastroenterology;  Laterality: N/A;   IR IMAGING GUIDED PORT INSERTION  04/17/2020   IR THORACENTESIS ASP PLEURAL SPACE W/IMG GUIDE  11/29/2021   JOINT REPLACEMENT Bilateral    REPLACEMENT  TOTAL KNEE BILATERAL  2015   TONSILLECTOMY  1960   TRANSTHORACIC ECHOCARDIOGRAM  05/2020   a) 05/2020: EF 55 to 60%.  No R WMA.  Mild LVH.  Indeterminate LVEDP.  Unable to assess RVP.  Normal aortic and mitral valves.  Mildly elevated RAP.; b) 09/2021: EF 50-55%. No RWMA. Mild LVH. ~ LVEDP. Mild LA Dilation. NORMAL RV/RAP.  Normal MV/AoV.    Family History  Problem Relation Age of Onset   Heart attack Mother    Cancer Mother    Hypertension Mother    Arthritis Father    Asthma Father    Cancer Father    COPD Father    Heart attack Father    Hypertension Sister    Cancer Sister    Diabetes Sister    Asthma Sister    Cancer Sister    COPD Sister    Arthritis Sister    Asthma Sister    Diabetes Sister    Birth defects Maternal Grandfather    Arthritis Paternal Grandmother    Diabetes Paternal Grandmother    Arthritis Paternal Grandfather    Asthma Son    Other Neg Hx        pituitary disorder    Allergies  Allergen Reactions   Bupropion     Racing heart   Hydrochlorothiazide Other (See Comments)    Cramping to lower extremities    Current Outpatient Medications on File Prior to Visit  Medication Sig Dispense Refill   albuterol (VENTOLIN HFA) 108 (90 Base) MCG/ACT inhaler Inhale 2 puffs into the lungs every 6 (six) hours as needed for wheezing or shortness of breath. 18 g 0   amLODipine (NORVASC) 10 MG tablet TAKE 1 TABLET BY MOUTH EVERY DAY for blood pressure. (Patient taking differently: Take 10 mg by mouth daily.) 90 tablet 1   B-D 3CC LUER-LOK SYR 22GX1" 22G X 1" 3 ML MISC USE AS INSTRUCTED FOR TESTOSTERONE INJECTION EVERY 2 WEEKS 10 each 2   bisoprolol (ZEBETA) 5 MG tablet Take 1 tablet (5 mg total) by mouth daily. 30 tablet 6   Black Pepper-Turmeric (TURMERIC COMPLEX/BLACK PEPPER PO) Take 1 tablet by mouth in the morning and at bedtime.     blood glucose meter kit and supplies KIT Dispense based on patient and insurance preference. Use up to four times daily as  directed. 1 each 0   Budeson-Glycopyrrol-Formoterol (BREZTRI AEROSPHERE) 160-9-4.8 MCG/ACT AERO Inhale 2 puffs into the lungs in the morning and at bedtime. 10.7 g 0   ELIQUIS 5 MG TABS tablet TAKE 1 TABLET BY MOUTH TWICE A DAY (Patient taking differently: Take 5 mg by mouth 2 (two) times daily.) 180 tablet 2   esomeprazole (NEXIUM) 20 MG capsule Take 20 mg  by mouth daily.      gabapentin (NEURONTIN) 300 MG capsule Take 300 mg by mouth at bedtime.     glipiZIDE (GLUCOTROL) 10 MG tablet TAKE 1 TABLET (10 MG TOTAL) BY MOUTH 2 (TWO) TIMES DAILY BEFORE A MEAL. FOR DIABETES. 180 tablet 3   Insulin Glargine (BASAGLAR KWIKPEN) 100 UNIT/ML Inject 20 Units into the skin at bedtime. For diabetes. 15 mL 3   Insulin Pen Needle (BD PEN NEEDLE NANO U/F) 32G X 4 MM MISC Use with insulin as prescribed  Dx Code: E11.9 100 each 2   ipratropium-albuterol (DUONEB) 0.5-2.5 (3) MG/3ML SOLN Take 3 mLs by nebulization every 4 (four) hours while awake for 3 days, THEN 3 mLs every 4 (four) hours as needed (shortness of breath or wheezing). 360 mL 0   Lancets MISC USE UP TO 3 TIMES DAILY AS DIRECTED 100 each 2   lidocaine-prilocaine (EMLA) cream Apply 1 application topically as needed. (Patient taking differently: Apply 1 application topically as needed (port access).) 30 g 2   losartan (COZAAR) 50 MG tablet Take 1 tablet (50 mg total) by mouth daily. For blood pressure. 90 tablet 3   Melatonin 10 MG CAPS Take 10 mg by mouth at bedtime.     metFORMIN (GLUCOPHAGE) 1000 MG tablet TAKE 1 TABLET (1,000 MG TOTAL) BY MOUTH 2 (TWO) TIMES DAILY WITH A MEAL. FOR DIABETES (Patient taking differently: Take 1,000 mg by mouth 2 (two) times daily with a meal.) 180 tablet 1   Multiple Vitamin (MULTI-VITAMINS) TABS Take 1 tablet by mouth daily.      Omega-3 Fatty Acids (SALMON OIL PO) Take 1 capsule by mouth daily.     ONETOUCH ULTRA test strip USE UP TO 4 TIMES DAILY AS DIRECTED 100 strip 5   oxymetazoline (AFRIN) 0.05 % nasal spray Place  1 spray into both nostrils 2 (two) times daily as needed for congestion.     pioglitazone (ACTOS) 45 MG tablet TAKE 1 TABLET (45 MG TOTAL) BY MOUTH DAILY. FOR DIABETES. (Patient taking differently: Take 45 mg by mouth daily.) 90 tablet 0   pravastatin (PRAVACHOL) 40 MG tablet TAKE 1 TABLET BY MOUTH EVERY DAY IN THE EVENING FOR CHOLESTEROL (Patient taking differently: Take 40 mg by mouth daily.) 90 tablet 1   pseudoephedrine-acetaminophen (TYLENOL SINUS) 30-500 MG TABS tablet Take 2 tablets by mouth at bedtime.     testosterone cypionate (DEPOTESTOSTERONE CYPIONATE) 200 MG/ML injection Inject 200 mg into the muscle every 14 (fourteen) days.     No current facility-administered medications on file prior to visit.    BP 132/88    Pulse 84    Temp 98.2 F (36.8 C) (Temporal)    Ht $R'5\' 9"'hI$  (1.753 m)    Wt 294 lb (133.4 kg)    SpO2 96%    BMI 43.42 kg/m  Objective:   Physical Exam Cardiovascular:     Rate and Rhythm: Normal rate and regular rhythm.  Pulmonary:     Effort: Pulmonary effort is normal.     Breath sounds: No wheezing.     Comments: Diminished lung sounds to right base Musculoskeletal:     Cervical back: Neck supple.  Skin:    General: Skin is warm and dry.  Neurological:     Mental Status: He is alert and oriented to person, place, and time.          Assessment & Plan:      This visit occurred during the SARS-CoV-2 public health emergency.  Safety  protocols were in place, including screening questions prior to the visit, additional usage of staff PPE, and extensive cleaning of exam room while observing appropriate contact time as indicated for disinfecting solutions.

## 2021-12-19 ENCOUNTER — Encounter: Payer: Self-pay | Admitting: Cardiology

## 2021-12-19 ENCOUNTER — Ambulatory Visit (INDEPENDENT_AMBULATORY_CARE_PROVIDER_SITE_OTHER): Payer: Medicare Other | Admitting: Cardiology

## 2021-12-19 VITALS — BP 136/70 | HR 89 | Ht 69.0 in | Wt 293.0 lb

## 2021-12-19 DIAGNOSIS — G4733 Obstructive sleep apnea (adult) (pediatric): Secondary | ICD-10-CM

## 2021-12-19 DIAGNOSIS — J90 Pleural effusion, not elsewhere classified: Secondary | ICD-10-CM

## 2021-12-19 DIAGNOSIS — C3411 Malignant neoplasm of upper lobe, right bronchus or lung: Secondary | ICD-10-CM | POA: Diagnosis not present

## 2021-12-19 DIAGNOSIS — I10 Essential (primary) hypertension: Secondary | ICD-10-CM

## 2021-12-19 DIAGNOSIS — E1169 Type 2 diabetes mellitus with other specified complication: Secondary | ICD-10-CM

## 2021-12-19 DIAGNOSIS — J441 Chronic obstructive pulmonary disease with (acute) exacerbation: Secondary | ICD-10-CM

## 2021-12-19 DIAGNOSIS — E785 Hyperlipidemia, unspecified: Secondary | ICD-10-CM

## 2021-12-19 DIAGNOSIS — R931 Abnormal findings on diagnostic imaging of heart and coronary circulation: Secondary | ICD-10-CM | POA: Diagnosis not present

## 2021-12-19 DIAGNOSIS — Z01810 Encounter for preprocedural cardiovascular examination: Secondary | ICD-10-CM

## 2021-12-19 DIAGNOSIS — R0609 Other forms of dyspnea: Secondary | ICD-10-CM

## 2021-12-19 NOTE — Assessment & Plan Note (Signed)
Stable blood pressure today on amlodipine, bisoprolol and losartan.  Appropriate medications.  Tolerating bisoprolol well.

## 2021-12-19 NOTE — Assessment & Plan Note (Signed)
On reassessment of his symptoms, it appears that his symptoms were mostly alleviated with the thoracentesis.  This was suggested exertional dyspnea is more from pulmonary issues then from cardiac.  Coronary CTA would also argue absence of significant macrovascular CAD.  He does have evidence of CAD with elevated heart Score and is on appropriate therapy.  At this point I think appropriately treating his cancer and pleural effusion is most appropriate plan without further cardiac evaluation

## 2021-12-19 NOTE — Assessment & Plan Note (Signed)
Continue CPAP.  

## 2021-12-19 NOTE — Assessment & Plan Note (Signed)
Being seen today prior to evaluation for possible CT surgery procedure which at this point would be palpable releases.  Could also potentially be lobectomy.  With normal echocardiogram and reassuring Coronary CTA, no further cardiac evaluation is needed.  He is already on stable dose of bisoprolol, amlodipine and losartan.  Would not recommend any further cardiac evaluation prior to surgery. Cardiology will be available if there is any requirement for assistance postprocedure. Would need to hold DOAC appropriately preop and restart when safe postop

## 2021-12-19 NOTE — Assessment & Plan Note (Signed)
Coronary CTA revealed a relatively significant Coronary Calcium Score, but did not suspect any significant coronary artery stenosis.  Although difficult study to read due to artifact, the indication is that there was not any significant upstream CAD.  At present, it seems like his symptoms of exertional dyspnea and chest tightness are more associated with his pleural effusion recurrence and not CAD.  Based on the fact that he got significantly better after thoracentesis, I would not recommend proceeding with cardiac catheterization unless he were to have symptoms despite having thoracentesis.   As for medical management, he is now on a great stable regimen.  Plan:  Just started on bisoprolol, tolerating well with stable blood pressure  Also on ARB and amlodipine both for afterload/BP control and amlodipine for antianginal benefit.  On stable dose of pravastatin with relatively well controlled lipids.  Not on aspirin or Plavix because he is on Eliquis for PE.  1 recommendation would be PCP consider converting from pioglitazone to either Farxiga/Jardiance (SGLT2 inhibitor) versus GLP-1 agonist (Ozempic/Trulicity).    Would recommend stopping pioglitazone/Actos, as he has tendency to lead to volume overload.

## 2021-12-19 NOTE — Assessment & Plan Note (Signed)
Clearly this contributes to his dyspnea as well as facilitating recurrence of pleural effusion.

## 2021-12-19 NOTE — Patient Instructions (Signed)
Medication Instructions:   Your physician recommends that you continue on your current medications as directed. Please refer to the Current Medication list given to you today.   *If you need a refill on your cardiac medications before your next appointment, please call your pharmacy*   Lab Work: None ordered  If you have labs (blood work) drawn today and your tests are completely normal, you will receive your results only by: [place] (if you have MyChart) OR A paper copy in the mail If you have any lab test that is abnormal or we need to change your treatment, we will call you to review the results.   Testing/Procedures: None ordered   Follow-Up: At [facility], you and your health needs are our priority.  As part of our continuing mission to provide you with exceptional heart care, we have created designated Provider Care Teams.  These Care Teams include your primary Cardiologist (physician) and Advanced Practice Providers (APPs -  Physician Assistants and Nurse Practitioners) who all work together to provide you with the care you need, when you need it.  We recommend signing up for the patient portal called "MyChart".  Sign up information is provided on this After Visit Summary.  MyChart is used to connect with patients for Virtual Visits (Telemedicine).  Patients are able to view lab/test results, encounter notes, upcoming appointments, etc.  Non-urgent messages can be sent to your provider as well.   To learn more about what you can do with MyChart, go to [email or web address].    Your next appointment:   3 month(s)  The format for your next appointment:   In Person  Provider:   Glenetta Hew, MD    Other Instructions Your provider has determined that your upcoming procedure is low risk and is safe from a cardiac prospective.

## 2021-12-19 NOTE — Progress Notes (Signed)
Primary Care Provider: [name], NP Cardiologist: Glenetta Hew, MD Electrophysiologist: None  Clinic Note: Chief Complaint  Patient presents with   Follow-up    F/U after CT   Shortness of Breath    Has had another thoracentesis with improvement of symptoms.  Now has CVTS consultation to consider talc pleurodesis   Coronary Artery Disease    Coronary CTA.   ===================================  ASSESSMENT/PLAN   Problem List Items Addressed This Visit       Cardiology Problems   Coronary Calcium Score 657 - Primary (Chronic)    Coronary CTA revealed a relatively significant Coronary Calcium Score, but did not suspect any significant coronary artery stenosis.  Although difficult study to read due to artifact, the indication is that there was not any significant upstream CAD.  At present, it seems like his symptoms of exertional dyspnea and chest tightness are more associated with his pleural effusion recurrence and not CAD.  Based on the fact that he got significantly better after thoracentesis, I would not recommend proceeding with cardiac catheterization unless he were to have symptoms despite having thoracentesis.   As for medical management, he is now on a great stable regimen.  Plan: Just started on bisoprolol, tolerating well with stable blood pressure Also on ARB and amlodipine both for afterload/BP control and amlodipine for antianginal benefit. On stable dose of pravastatin with relatively well controlled lipids. Not on aspirin or Plavix because he is on Eliquis for PE. 1 recommendation would be PCP consider converting from pioglitazone to either Farxiga/Jardiance (SGLT2 inhibitor) versus GLP-1 agonist (Ozempic/Trulicity).   Would recommend stopping pioglitazone/Actos, as he has tendency to lead to volume overload.      Essential hypertension (Chronic)    Stable blood pressure today on amlodipine, bisoprolol and losartan.  Appropriate  medications.  Tolerating bisoprolol well.      Hyperlipidemia associated with type 2 diabetes mellitus ([place]) (Chronic)    Most recent lipid panel was in August 2022.  While the triglycerides are little high, LDL was 66 on pravastatin.  Based on Coronary Calcium Score of 650, target LDL should be less than 70 if not closer to 55.  He is therefore at goal on pravastatin.  Would not change present dose, unless levels go up as opposed to down.        Other   Preop cardiovascular exam    Being seen today prior to evaluation for possible CT surgery procedure which at this point would be palpable releases.  Could also potentially be lobectomy.  With normal echocardiogram and reassuring Coronary CTA, no further cardiac evaluation is needed.  He is already on stable dose of bisoprolol, amlodipine and losartan.  Would not recommend any further cardiac evaluation prior to surgery. Cardiology will be available if there is any requirement for assistance postprocedure. Would need to hold DOAC appropriately preop and restart when safe postop      OSA (obstructive sleep apnea) (Chronic)    Continue CPAP      Exertional dyspnea (Chronic)    On reassessment of his symptoms, it appears that his symptoms were mostly alleviated with the thoracentesis.  This was suggested exertional dyspnea is more from pulmonary issues then from cardiac.  Coronary CTA would also argue absence of significant macrovascular CAD.  He does have evidence of CAD with elevated heart Score and is on appropriate therapy.  At this point I think appropriately treating his cancer and pleural effusion is most appropriate plan without further cardiac evaluation  Small cell lung cancer, right upper lobe (HCC) (Chronic)   Recurrent right pleural effusion (Chronic)    Rapid recurrence/reaccumulation of fluid.  Patient was seen now by Dr. Valeta Harms from pulmonary medicine and referred to Dr. Kipp Brood to consider talc pleurodesis.  This  would be likely more appropriate treatment option to avoid recurrence of effusion. Overall relatively low risk procedure from a cardiac standpoint.  With relatively normal echocardiogram and reassuring Coronary CTA, would not require any further cardiac evaluation.  He is on excellent regimen.  Would consider from a cardiac standpoint to be low risk for what is probably an intermediate risk surgery.  Would not recommend any further cardiac evaluation, will continue stable stable medication regimen, although he will have to hold his DOAC preop.      COPD with exacerbation (HCC) (Chronic)    Clearly this contributes to his dyspnea as well as facilitating recurrence of pleural effusion.       ===================================  HPI:    Adam Wise is a 71 y.o. 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  Adam Wise was seen for initial consultation on November 13, 2021 in response to coronary cusp again seen on CT scan with progressive dyspnea.  Plan was Coronary CTA to exclude ischemic CAD as a potential etiology for his dyspnea, fatigue, and atypical chest pain.Marland Kitchen   Recent Hospitalizations:  IR thoracentesis on 11/29/2021.  Most recently seen by Dr. Valeta Harms from pulmonary medicine on 12/13/2021 -> this is for evaluation of shortness of breath and pleural effusion.  Discussed Pleurx catheter placement -> was considering talc pleurodesis => referred to CVTS  Reviewed  CV studies:    The following studies were reviewed today: (if available, images/films reviewed: From Epic Chart or Care Everywhere) TTE 10/12/2021:: EF 50 to 55%.  No R WMA.  Mild LVH.  Indeterminate diastolic parameters.   mild LA dilation.  Normal RV size and function.  Normal RAP Coronary CTA 11/27/2021: Moderate to large right pleural effusion and compressive atelectasis right lung  base. => Coronary Calcium Score 657.  Diffuse RCA calcification.  Minimal mild disease in the LAD and diagonal branches. == Overall limited study.  Notable artifact.  Interval History:   Adam Wise returns today with his wife stating that he certainly felt better after having thoracentesis.  He was just apprised of how quickly the fluid had returned/reaccumulated.  Shortly after having thoracentesis, he definitely noticed that his exertional dyspnea improved dramatically.  It has been now couple weeks since his thoracentesis and he is starting to notice again having some more dyspnea, but not as bad as it was last time.  He continues to deny any chest pain or pressure at rest or exertion.  In the few days leading up to his thoracentesis he did have orthopnea and worsening exertional dyspnea with chest tightness during deep inspiration.  However post-thoracentesis, this completely resolved.  CV Review of Symptoms (Summary) Cardiovascular ROS:  positive for - dyspnea on exertion, rapid heart rate, shortness of breath, and some lightheadedness or dizziness, but notes that this is better post thoracentesis. negative for - chest pain, irregular heartbeat, orthopnea, paroxysmal nocturnal dyspnea, or Syncope/near syncope, TIA/amaurosis fugax, claudication  REVIEWED OF SYSTEMS   Review of Systems  Constitutional:  Positive for malaise/fatigue (Says started to get quite deconditioned).  HENT:  Positive for congestion. Negative for nosebleeds.  Respiratory:  Positive for cough and sputum production.   Gastrointestinal:  Negative for blood in stool, constipation and melena.  Genitourinary:  Negative for frequency and hematuria.  Musculoskeletal:  Positive for back pain and joint pain. Negative for falls and myalgias.  Neurological:  Positive for dizziness (Not since thoracentesis). Negative for focal weakness and weakness.  Psychiatric/Behavioral:  Negative for depression (Seems to be dealing with the  whole situation very well.) and memory loss. The patient is nervous/anxious. The patient does not have insomnia.     I have reviewed and (if needed) personally updated the patient's problem list, medications, allergies, past medical and surgical history, social and family history.   PAST MEDICAL HISTORY   Past Medical History:  Diagnosis Date   Chickenpox    Chronic knee pain    Chronic low back pain    Coronary artery calcification seen on CAT scan 11/2021   Coronary CTA 11/27/2021: Moderate to large right pleural effusion and compressive atelectasis right lung base. => Coronary Calcium Score 657.  Diffuse RCA calcification.  Minimal mild disease in the LAD and diagonal branches. == Overall limited study.  Notable artifact.   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 He has disease progression after the first line of chemotherapy in December 2021.   Testosterone deficiency    Type 2 diabetes mellitus ([place])     PAST SURGICAL HISTORY   Past Surgical History:  Procedure Laterality Date   COLONOSCOPY WITH PROPOFOL N/A 12/17/2018   Procedure: COLONOSCOPY WITH PROPOFOL;  Surgeon: Virgel Manifold, MD;  Location: [facility] ENDOSCOPY;  Service: Gastroenterology;  Laterality: N/A;   IR IMAGING GUIDED PORT INSERTION  04/17/2020   IR THORACENTESIS ASP PLEURAL SPACE W/IMG GUIDE  11/29/2021   JOINT REPLACEMENT Bilateral    REPLACEMENT TOTAL KNEE BILATERAL  2015   TONSILLECTOMY  1960   TRANSTHORACIC ECHOCARDIOGRAM  05/2020   a) 05/2020: EF 55 to 60%.  No R WMA.  Mild LVH.  Indeterminate LVEDP.  Unable to assess RVP.  Normal aortic and mitral valves.  Mildly  elevated RAP.; b) 09/2021: EF 50-55%. No RWMA. Mild LVH. ~ LVEDP. Mild LA Dilation. NORMAL RV/RAP.  Normal MV/AoV.    Immunization History  Administered Date(s) Administered   Fluad Quad(high Dose 65+) 10/26/2020, 08/13/2021   Influenza Inj Mdck Quad Pf 09/18/2017   Influenza,inj,Quad PF,6+ Mos 10/27/2018, 11/08/2019   Influenza-Unspecified 09/18/2017   PFIZER(Purple Top)SARS-COV-2 Vaccination 03/12/2020, 04/02/2020, 12/11/2020   Pneumococcal Conjugate-13 08/17/2015   Pneumococcal Polysaccharide-23 11/25/2012, 10/27/2018   Td 08/27/2016    MEDICATIONS/ALLERGIES   Current Meds  Medication Sig   albuterol (VENTOLIN HFA) 108 (90 Base) MCG/ACT inhaler Inhale 2 puffs into the lungs every 6 (six) hours as needed for wheezing or shortness of breath.   amLODipine (NORVASC) 10 MG tablet TAKE 1 TABLET BY MOUTH EVERY DAY for blood pressure.   B-D 3CC LUER-LOK SYR 22GX1" 22G X 1" 3 ML MISC USE AS INSTRUCTED FOR TESTOSTERONE INJECTION EVERY 2 WEEKS   bisoprolol (ZEBETA) 5 MG tablet Take 1 tablet (5 mg total) by mouth daily.   Black Pepper-Turmeric (TURMERIC COMPLEX/BLACK PEPPER PO) Take 1 tablet by mouth in the morning and at  bedtime.   blood glucose meter kit and supplies KIT Dispense based on patient and insurance preference. Use up to four times daily as directed.   Budeson-Glycopyrrol-Formoterol (BREZTRI AEROSPHERE) 160-9-4.8 MCG/ACT AERO Inhale 2 puffs into the lungs in the morning and at bedtime.   ELIQUIS 5 MG TABS tablet TAKE 1 TABLET BY MOUTH TWICE A DAY   esomeprazole (NEXIUM) 20 MG capsule Take 20 mg by mouth daily.    gabapentin (NEURONTIN) 300 MG capsule Take 300 mg by mouth at bedtime.   glipiZIDE (GLUCOTROL) 10 MG tablet TAKE 1 TABLET (10 MG TOTAL) BY MOUTH 2 (TWO) TIMES DAILY BEFORE A MEAL. FOR DIABETES.   Insulin Glargine (BASAGLAR KWIKPEN) 100 UNIT/ML Inject 20 Units into the skin at bedtime. For diabetes.   Insulin Pen Needle (BD PEN NEEDLE NANO U/F) 32G X 4 MM MISC Use with  insulin as prescribed  Dx Code: E11.9   Lancets MISC USE UP TO 3 TIMES DAILY AS DIRECTED   lidocaine-prilocaine (EMLA) cream Apply 1 application topically as needed.   losartan (COZAAR) 50 MG tablet Take 1 tablet (50 mg total) by mouth daily. For blood pressure.   Melatonin 10 MG CAPS Take 10 mg by mouth at bedtime.   metFORMIN (GLUCOPHAGE) 1000 MG tablet TAKE 1 TABLET (1,000 MG TOTAL) BY MOUTH 2 (TWO) TIMES DAILY WITH A MEAL. FOR DIABETES   Multiple Vitamin (MULTI-VITAMINS) TABS Take 1 tablet by mouth daily.    Omega-3 Fatty Acids (SALMON OIL PO) Take 1 capsule by mouth daily.   ONETOUCH ULTRA test strip USE UP TO 4 TIMES DAILY AS DIRECTED   oxymetazoline (AFRIN) 0.05 % nasal spray Place 1 spray into both nostrils 2 (two) times daily as needed for congestion.   pioglitazone (ACTOS) 45 MG tablet TAKE 1 TABLET (45 MG TOTAL) BY MOUTH DAILY. FOR DIABETES.   pravastatin (PRAVACHOL) 40 MG tablet TAKE 1 TABLET BY MOUTH EVERY DAY IN THE EVENING FOR CHOLESTEROL   pseudoephedrine-acetaminophen (TYLENOL SINUS) 30-500 MG TABS tablet Take 2 tablets by mouth at bedtime.   testosterone cypionate (DEPOTESTOSTERONE CYPIONATE) 200 MG/ML injection Inject 200 mg into the muscle every 14 (fourteen) days.    Allergies  Allergen Reactions   Bupropion     Racing heart   Hydrochlorothiazide Other (See Comments)    Cramping to lower extremities    SOCIAL HISTORY/FAMILY HISTORY   Reviewed in Epic:  Pertinent findings:  Social History   Tobacco Use   Smoking status: Former   Smokeless tobacco: Never  Scientific laboratory technician Use: Never used  Substance Use Topics   Alcohol use: Yes    Comment: rarely   Drug use: Not Currently   Social History   Social History Narrative   Married.   Moved from [place].   Retired.    OBJCTIVE -PE, EKG, labs   Wt Readings from Last 3 Encounters:  12/19/21 293 lb (132.9 kg)  12/18/21 294 lb (133.4 kg)  12/13/21 294 lb (133.4 kg)    Physical Exam: BP 136/70 (BP  Location: Left Arm, Patient Position: Sitting, Cuff Size: Large)    Pulse 89    Ht _0  (1.753 m)    Wt 293 lb (132.9 kg)    SpO2 94%    BMI 43.27 kg/m  Physical Exam Vitals reviewed.  Constitutional:      Appearance: Normal appearance. He is obese.     Comments: Other than being obese, relatively healthy appearing.  In good spirits.  HENT:  Head: Normocephalic and atraumatic.  Neck:     Vascular: No carotid bruit.  Cardiovascular:     Rate and Rhythm: Normal rate and regular rhythm. Occasional Extrasystoles are present.    Chest Wall: PMI is not displaced.     Pulses: Decreased pulses (Decreased to the tense trace edema).     Heart sounds: S1 normal and S2 normal. Heart sounds are distant. No murmur heard.   No friction rub. Gallop (Either split S2 or S4 gallop.) present.  Pulmonary:     Effort: Pulmonary effort is normal. No respiratory distress.     Breath sounds: Normal breath sounds. No wheezing, rhonchi or rales.     Comments: Mildly diminished right base of breath sounds but otherwise CTA B, nonlabored Chest:     Chest wall: Tenderness present.  Abdominal:     General: Abdomen is flat. Bowel sounds are normal.     Palpations: Abdomen is soft.  Musculoskeletal:        General: Normal range of motion.     Cervical back: Normal range of motion and neck supple.  Skin:    General: Skin is warm and dry.  Neurological:     General: No focal deficit present.     Mental Status: He is alert and oriented to person, place, and time.  Psychiatric:        Mood and Affect: Mood normal.        Behavior: Behavior normal.        Thought Content: Thought content normal.        Judgment: Judgment normal.     Adult ECG Report Not checked  Recent Labs: Reviewed Lab Results  Component Value Date   CHOL 131 08/13/2021   HDL 44.30 08/13/2021   LDLCALC 57 11/01/2019   LDLDIRECT 66.0 08/13/2021   TRIG 209.0 (H) 08/13/2021   CHOLHDL 3 08/13/2021   Lab Results  Component Value  Date   CREATININE 1.11 12/11/2021   BUN 16 12/11/2021   NA 137 12/11/2021   K 4.1 12/11/2021   CL 102 12/11/2021   CO2 29 12/11/2021   CBC Latest Ref Rng & Units 12/11/2021 11/19/2021 10/29/2021  WBC 4.0 - 10.5 K/uL 4.2 4.1 4.7  Hemoglobin 13.0 - 17.0 g/dL 11.9(L) 12.0(L) 12.3(L)  Hematocrit 39.0 - 52.0 % 35.1(L) 36.4(L) 37.2(L)  Platelets 150 - 400 K/uL 238 251 232    Lab Results  Component Value Date   HGBA1C 7.0 (A) 12/18/2021   Lab Results  Component Value Date   TSH 0.500 06/26/2021    ==================================================  COVID-19 Education: The signs and symptoms of COVID-19 were discussed with the patient and how to seek care for testing (follow up with PCP or arrange E-visit).    I spent a total of 52  minutes with the patient spent in direct patient consultation.  Additional time spent with chart review  / charting (studies, outside notes, etc): 20 min Total Time: 72  min  Current medicines are reviewed at length with the patient today.  (+/- concerns) none  This visit occurred during the SARS-CoV-2 public health emergency.  Safety protocols were in place, including screening questions prior to the visit, additional usage of staff PPE, and extensive cleaning of exam room while observing appropriate contact time as indicated for disinfecting solutions.  Notice: This dictation was prepared with Dragon dictation along with smart phrase technology. Any transcriptional errors that result from this process are unintentional and may not be corrected upon review.  Studies  Ordered:  No orders of the defined types were placed in this encounter.   Patient Instructions / Medication Changes & Studies & Tests Ordered   Patient Instructions  Medication Instructions:   Your physician recommends that you continue on your current medications as directed. Please refer to the Current Medication list given to you today.   *If you need a refill on your cardiac  medications before your next appointment, please call your pharmacy*   Lab Work: None ordered  If you have labs (blood work) drawn today and your tests are completely normal, you will receive your results only by: [place] (if you have MyChart) OR A paper copy in the mail If you have any lab test that is abnormal or we need to change your treatment, we will call you to review the results.   Testing/Procedures: None ordered   Follow-Up: At [facility], you and your health needs are our priority.  As part of our continuing mission to provide you with exceptional heart care, we have created designated Provider Care Teams.  These Care Teams include your primary Cardiologist (physician) and Advanced Practice Providers (APPs -  Physician Assistants and Nurse Practitioners) who all work together to provide you with the care you need, when you need it.  We recommend signing up for the patient portal called "MyChart".  Sign up information is provided on this After Visit Summary.  MyChart is used to connect with patients for Virtual Visits (Telemedicine).  Patients are able to view lab/test results, encounter notes, upcoming appointments, etc.  Non-urgent messages can be sent to your provider as well.   To learn more about what you can do with MyChart, go to [email or web address].    Your next appointment:   3 month(s)  The format for your next appointment:   In Person  Provider:   Glenetta Hew, MD    Other Instructions Your provider has determined that your upcoming procedure is low risk and is safe from a cardiac prospective.       Glenetta Hew, M.D., M.S. Interventional Cardiologist   Pager # [phone] Phone # [phone] [address] [place], [place] [address]   Thank you for choosing [facility] [place]!!

## 2021-12-19 NOTE — Assessment & Plan Note (Signed)
Most recent lipid panel was in August 2022.  While the triglycerides are little high, LDL was 66 on pravastatin.  Based on Coronary Calcium Score of 650, target LDL should be less than 70 if not closer to 55.  He is therefore at goal on pravastatin.  Would not change present dose, unless levels go up as opposed to down.

## 2021-12-19 NOTE — Assessment & Plan Note (Signed)
Rapid recurrence/reaccumulation of fluid.  Patient was seen now by Dr. Valeta Harms from pulmonary medicine and referred to Dr. Kipp Brood to consider talc pleurodesis.  This would be likely more appropriate treatment option to avoid recurrence of effusion. Overall relatively low risk procedure from a cardiac standpoint.  With relatively normal echocardiogram and reassuring Coronary CTA, would not require any further cardiac evaluation.  He is on excellent regimen.  Would consider from a cardiac standpoint to be low risk for what is probably an intermediate risk surgery.  Would not recommend any further cardiac evaluation, will continue stable stable medication regimen, although he will have to hold his DOAC preop.

## 2021-12-20 ENCOUNTER — Ambulatory Visit
Admission: RE | Admit: 2021-12-20 | Discharge: 2021-12-20 | Disposition: A | Discharge status: Home / Self Care | Payer: Medicare Other | Source: Ambulatory Visit | Attending: Thoracic Surgery (Cardiothoracic Vascular Surgery) | Admitting: Thoracic Surgery (Cardiothoracic Vascular Surgery)

## 2021-12-20 ENCOUNTER — Other Ambulatory Visit: Payer: Self-pay

## 2021-12-20 ENCOUNTER — Institutional Professional Consult (permissible substitution) (INDEPENDENT_AMBULATORY_CARE_PROVIDER_SITE_OTHER): Payer: Medicare Other | Admitting: Thoracic Surgery (Cardiothoracic Vascular Surgery)

## 2021-12-20 VITALS — BP 135/85 | HR 86 | Resp 20 | Ht 69.0 in | Wt 293.0 lb

## 2021-12-20 DIAGNOSIS — R918 Other nonspecific abnormal finding of lung field: Secondary | ICD-10-CM | POA: Diagnosis not present

## 2021-12-20 DIAGNOSIS — C3411 Malignant neoplasm of upper lobe, right bronchus or lung: Secondary | ICD-10-CM

## 2021-12-20 DIAGNOSIS — J90 Pleural effusion, not elsewhere classified: Secondary | ICD-10-CM | POA: Diagnosis not present

## 2021-12-20 DIAGNOSIS — C3490 Malignant neoplasm of unspecified part of unspecified bronchus or lung: Secondary | ICD-10-CM | POA: Diagnosis not present

## 2021-12-29 NOTE — Progress Notes (Signed)
[facility] OFFICE PROGRESS NOTE  [name], NP [place] [address]  DIAGNOSIS:  Relapsed extensive stage (T4, N3, M1c)  small cell lung cancer diagnosed in April 2021 and presented with large right upper lobe/right hilar mass with ipsilateral and contralateral mediastinal and right supraclavicular lymphadenopathy in addition to multiple liver lesions. He has disease progression after the first line of chemotherapy in December 2021.  PRIOR THERAPY: 1) Palliative radiotherapy to the right upper lobe lung mass under the care of Dr. Lisbeth Renshaw. 2) Systemic chemotherapy with carboplatin for AUC of 5 on day 1, etoposide 100 mg/M2 on days 1, 2 and 3 in addition to Imfinzi 1500 mg IV every 3 weeks with chemotherapy then every 4 weeks for maintenance if the patient has no evidence for progression.  He will also receive Cosela 240 mg/m2 on the days of the chemotherapy.  Status post 9 cycles.  Starting from cycle #5 the patient will be on maintenance treatment with immunotherapy with Imfinzi 1500 mg IV every 4 weeks. Last dose of chemotherapy was given on November 13, 2020. This treatment was discontinued secondary to disease progression 3) Systemic chemotherapy with carboplatin for AUC of 5 on day 1, etoposide 100 mg/M2 on days 1, 2 and 3, Tecentriq 1200 mg IV every 3 weeks as well as Cosela 250 mg/M2 on the days of the chemotherapy every 3 weeks.  First dose December 18, 2020.  Status post 8 cycles.  CURRENT THERAPY: Zepzelca 3.2 mgm2 IV every 3 weeks. First dose expected on 06/05/21. Status post 10 cycles  INTERVAL HISTORY: Shuayb Schepers 71 y.o. male returns to the clinic today for a follow-up visit accompanied by his wife.  The patient is feeling fairly well today without any concerning complaints except at the last several appointments, the patient was wondering what his options are regarding his recurrent pleural effusions.  He has had several thoracenteses in the past.   He notices significant improvement in his cough, chest heaviness, and shortness of breath after having a thoracentesis performed.  He was referred to pulmonary medicine for consideration of Pleurx catheter placement.  He also wants to know what his surgical options are regarding talc pleurodesis.  The patient was evaluated by cardiothoracic surgery who felt that with the radiation changes in his lungs that surgical intervention would be high risk and recommended Pleurx catheter placement.  The patient is considering his options at this time. His wife reached out to the pulmonology office with a question and is waiting to hear back before making a decision. The patient feels like his pleural effusion is present and will be needed to be drained within the week or two. They know to call us if thoracentesis needs to be arranged. They would like to wait until they hear from pulmonology before ordering thoracentesis. He reports shortness of breath which got worse since his last thoracentesis but states it stabilized the last few weeks. He notes a dry cough started a few days ago. He is requesting a tussinex refill.   He also recently saw his cardiologist for shortness of breath.  Given that his shortness of breath improves after having a thoracentesis, they do not feel that his symptoms were related to coronary artery disease.  They do not recommend a cardiac catheterization at this time unless he continues to have shortness of breath despite removal of his right pleural effusion.  Overall, the patient denies any fever, chills, or night sweats. His weight is  stable. He is compliant with his blood thinner for his history of pulmonary embolism.  Denies any chest pain.  Denies any hemoptysis.  Denies any nausea, vomiting, diarrhea, or constipation.  Denies any headache.  He reports some increased blurry vision secondary to being overdue for his annual eye exam for his diabetes.  The patient is here today for evaluation  and repeat blood work before starting cycle #11.  MEDICAL HISTORY: Past Medical History:  Diagnosis Date   Chickenpox    Chronic knee pain    Chronic low back pain    Coronary artery calcification seen on CAT scan 11/2021   Coronary CTA 11/27/2021: Moderate to large right pleural effusion and compressive atelectasis right lung base. => Coronary Calcium Score 657.  Diffuse RCA calcification.  Minimal mild disease in the LAD and diagonal branches. == Overall limited study.  Notable artifact.   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 He has disease progression after the first line of chemotherapy in December 2021.   Testosterone deficiency    Type 2 diabetes mellitus (HCC)     ALLERGIES:  is allergic to bupropion and hydrochlorothiazide.  MEDICATIONS:  Current Outpatient Medications  Medication Sig Dispense Refill   albuterol (VENTOLIN HFA) 108 (90 Base) MCG/ACT inhaler Inhale 2 puffs into the lungs every 6 (six) hours as needed for wheezing or shortness of breath. 18 g 0   amLODipine (NORVASC) 10 MG tablet TAKE 1 TABLET BY MOUTH EVERY DAY for blood pressure. 90 tablet 1   B-D 3CC LUER-LOK SYR 22GX1" 22G X 1" 3 ML MISC USE AS INSTRUCTED FOR TESTOSTERONE INJECTION EVERY 2 WEEKS 10 each 2   bisoprolol (ZEBETA) 5 MG tablet Take 1 tablet (5 mg total) by mouth daily. 30 tablet 6   Black Pepper-Turmeric (TURMERIC COMPLEX/BLACK PEPPER PO) Take 1 tablet by mouth in the morning and at bedtime.     blood glucose meter kit and supplies KIT Dispense based on patient and insurance preference. Use up to four times daily as directed. 1 each 0    Budeson-Glycopyrrol-Formoterol (BREZTRI AEROSPHERE) 160-9-4.8 MCG/ACT AERO Inhale 2 puffs into the lungs in the morning and at bedtime. 10.7 g 0   chlorpheniramine-HYDROcodone (TUSSIONEX) 10-8 MG/5ML SUER Take 5 mLs by mouth at bedtime as needed for cough. 473 mL 0   ELIQUIS 5 MG TABS tablet TAKE 1 TABLET BY MOUTH TWICE A DAY 180 tablet 2   esomeprazole (NEXIUM) 20 MG capsule Take 20 mg by mouth daily.      gabapentin (NEURONTIN) 300 MG capsule Take 300 mg by mouth at bedtime.     glipiZIDE (GLUCOTROL) 10 MG tablet TAKE 1 TABLET (10 MG TOTAL) BY MOUTH 2 (TWO) TIMES DAILY BEFORE A MEAL. FOR DIABETES. 180 tablet 3   Insulin Glargine (BASAGLAR KWIKPEN) 100 UNIT/ML Inject 20 Units into the skin at bedtime. For diabetes. 15 mL 3   Insulin Pen Needle (BD PEN NEEDLE NANO U/F) 32G X 4 MM MISC Use with insulin as prescribed  Dx Code: E11.9 100 each 2   Lancets MISC USE UP TO 3 TIMES DAILY AS DIRECTED 100 each 2   lidocaine-prilocaine (EMLA) cream Apply 1 application topically as  needed. 30 g 2   losartan (COZAAR) 50 MG tablet Take 1 tablet (50 mg total) by mouth daily. For blood pressure. 90 tablet 3   Melatonin 10 MG CAPS Take 10 mg by mouth at bedtime.     metFORMIN (GLUCOPHAGE) 1000 MG tablet TAKE 1 TABLET (1,000 MG TOTAL) BY MOUTH 2 (TWO) TIMES DAILY WITH A MEAL. FOR DIABETES 180 tablet 1   Multiple Vitamin (MULTI-VITAMINS) TABS Take 1 tablet by mouth daily.      Omega-3 Fatty Acids (SALMON OIL PO) Take 1 capsule by mouth daily.     ONETOUCH ULTRA test strip USE UP TO 4 TIMES DAILY AS DIRECTED 100 strip 5   oxymetazoline (AFRIN) 0.05 % nasal spray Place 1 spray into both nostrils 2 (two) times daily as needed for congestion.     pioglitazone (ACTOS) 45 MG tablet TAKE 1 TABLET (45 MG TOTAL) BY MOUTH DAILY. FOR DIABETES. 90 tablet 0   pravastatin (PRAVACHOL) 40 MG tablet TAKE 1 TABLET BY MOUTH EVERY DAY IN THE EVENING FOR CHOLESTEROL 90 tablet 1   pseudoephedrine-acetaminophen (TYLENOL SINUS) 30-500 MG  TABS tablet Take 2 tablets by mouth at bedtime.     testosterone cypionate (DEPOTESTOSTERONE CYPIONATE) 200 MG/ML injection Inject 200 mg into the muscle every 14 (fourteen) days.     ipratropium-albuterol (DUONEB) 0.5-2.5 (3) MG/3ML SOLN Take 3 mLs by nebulization every 4 (four) hours while awake for 3 days, THEN 3 mLs every 4 (four) hours as needed (shortness of breath or wheezing). 360 mL 0   No current facility-administered medications for this visit.    SURGICAL HISTORY:  Past Surgical History:  Procedure Laterality Date   COLONOSCOPY WITH PROPOFOL N/A 12/17/2018   Procedure: COLONOSCOPY WITH PROPOFOL;  Surgeon: Virgel Manifold, MD;  Location: [facility];  Service: Gastroenterology;  Laterality: N/A;   IR IMAGING GUIDED PORT INSERTION  04/17/2020   IR THORACENTESIS ASP PLEURAL SPACE W/IMG GUIDE  11/29/2021   JOINT REPLACEMENT Bilateral    REPLACEMENT TOTAL KNEE BILATERAL  2015   TONSILLECTOMY  1960   TRANSTHORACIC ECHOCARDIOGRAM  05/2020   a) 05/2020: EF 55 to 60%.  No R WMA.  Mild LVH.  Indeterminate LVEDP.  Unable to assess RVP.  Normal aortic and mitral valves.  Mildly elevated RAP.; b) 09/2021: EF 50-55%. No RWMA. Mild LVH. ~ LVEDP. Mild LA Dilation. NORMAL RV/RAP.  Normal MV/AoV.    REVIEW OF SYSTEMS:   Constitutional: Negative for appetite change, chills, fatigue, fever and unexpected weight change.  HENT: Negative for mouth sores, nosebleeds, sore throat and trouble swallowing.   Eyes: Negative for eye problems and icterus.  Respiratory: Positive for dyspnea on exertion and cough.. Negative for hemoptysis and wheezing.   Cardiovascular: Negative for chest pain and leg swelling.  Gastrointestinal: Negative for abdominal pain, constipation, diarrhea, nausea and vomiting.  Genitourinary: Negative for bladder incontinence, difficulty urinating, dysuria, frequency and hematuria.   Musculoskeletal: Positive for occassional back discomfort with increased right pleural  effusion. Negative for  gait problem, neck pain and neck stiffness.  Skin: Negative for itching and rash.  Neurological: Negative for dizziness, extremity weakness, gait problem, headaches, light-headedness and seizures.  Hematological: Negative for adenopathy. Does not bruise/bleed easily.  Psychiatric/Behavioral: Negative for confusion, depression and sleep disturbance. The patient is not nervous/anxious.    PHYSICAL EXAMINATION:  Blood pressure 134/66, pulse 80, temperature 98.6 F (37 C), temperature source Oral, resp. rate 18, weight 290 lb 12.8 oz (131.9 kg), SpO2 96 %.  ECOG PERFORMANCE STATUS:  1  Physical Exam  Constitutional: Oriented to person, place, and time and well-developed, well-nourished, and in no distress. No distress.  HENT:  Head: Normocephalic and atraumatic.  Mouth/Throat: Oropharynx is clear and moist. No oropharyngeal exudate.  Eyes: Conjunctivae are normal. Right eye exhibits no discharge. Left eye exhibits no discharge. No scleral icterus.  Neck: Normal range of motion. Neck supple.  Cardiovascular: Normal rate, regular rhythm, normal heart sounds and intact distal pulses.   Pulmonary/Chest: Effort normal. Decreased breath sounds mid-lower right lung field. No respiratory distress. No wheezes. No rales.  Abdominal: Soft. Bowel sounds are normal. Exhibits no distension and no mass. There is no tenderness.  Musculoskeletal: Normal range of motion. Exhibits no edema.  Lymphadenopathy:    No cervical adenopathy.  Neurological: Alert and oriented to person, place, and time. Exhibits normal muscle tone. Gait normal. Coordination normal.  Skin: Skin is warm and dry. No rash noted. Not diaphoretic. No erythema. No pallor.  Psychiatric: Mood, memory and judgment normal.  Vitals reviewed.  LABORATORY DATA: Lab Results  Component Value Date   WBC 3.0 (L) 12/31/2021   HGB 11.5 (L) 12/31/2021   HCT 35.4 (L) 12/31/2021   MCV 90.8 12/31/2021   PLT 226 12/31/2021       Chemistry      Component Value Date/Time   NA 137 12/11/2021 1433   K 4.1 12/11/2021 1433   CL 102 12/11/2021 1433   CO2 29 12/11/2021 1433   BUN 16 12/11/2021 1433   CREATININE 1.11 12/11/2021 1433   CREATININE 1.45 (H) 10/18/2021 1533      Component Value Date/Time   CALCIUM 8.7 (L) 12/11/2021 1433   ALKPHOS 59 12/11/2021 1433   AST 24 12/11/2021 1433   ALT 19 12/11/2021 1433   BILITOT 0.4 12/11/2021 1433       RADIOGRAPHIC STUDIES:  DG Chest 2 View  Result Date: 12/20/2021 CLINICAL DATA:  71 year old male with small cell lung cancer status post chemo radiation. EXAM: CHEST - 2 VIEW COMPARISON:  Portable chest 11/29/2021 and earlier. FINDINGS: PA and lateral views. Stable right chest power port. Increasing dense and veiling opacity in the right lower lung compatible with some increase in the known right pleural effusion since last month. Superimposed extensive architectural distortion extending from the right hilum into the lung apex is stable. No superimposed pneumothorax or pulmonary edema. Other mediastinal contours are stable, within normal limits. Left lung appears stable and negative. Visualized tracheal air column is within normal limits. Stable visualized osseous structures. Negative visible bowel gas. IMPRESSION: 1. Increasing right pleural effusion since restaging CTs last month. 2. Otherwise stable post treatment appearance of the chest. Electronically Signed   By: Genevie Ann M.D.   On: 12/20/2021 09:01     ASSESSMENT/PLAN:  This is a very pleasant 71 year old Caucasian male diagnosed with extensive stage (T4, N3, M1C) small cell lung cancer presented with large right upper lobe lung mass in addition to mediastinal and right supraclavicular lymphadenopathy and multiple metastatic liver lesions diagnosed in April 2021.   The patient initially underwent systemic chemotherapy with carboplatin for an AUC of 5 on day 1, etoposide 100 mg per metered squared on days 1, 2, and  3 in addition to [facility] for myeloprotection.  He also received immunotherapy with Imfinzi on day 1 of every chemotherapy cycle.  He is status post 10 cycles.  Starting from cycle #5 he was on single agent immunotherapy with Imfinzi IV every 4 weeks  The patient had  evidence of disease progression on his scan from December 2021.  He was then started on systemic chemotherapy with carboplatin for an AUC of 5 on day 1, etoposide 100 mg per metered squared on days 1, 2, and 3 in addition to Tecentriq 1200 mg on day 1, the patient is status post 8 cycles.  Starting from cycle #5, the patient started maintenance immunotherapy with Tecentriq.  This was discontinued due to evidence of disease progression.  The patient is currently undergoing palliative systemic chemotherapy with Zepzelca IV every 3 weeks.  He is status post his 10 cycles and he tolerated it well except for fatigue a few days after treatment.     The patient's last scan showed: The liver lesions have decreased in size. There was some increase in prominent nodular soft tissue density today. Radiology commented that while this may be treatment related, close attention on follow-up recommended as recurrent disease not excluded. We will need to continue to monitor this closely though on follow-up imaging.  We will arrange follow up imaging at his next appointment.   We will see the patient back for follow-up visit in 3 weeks for evaluation before starting cycle #12.     He will continue to use Tussionex for his cough. I have sent a refill.   His oxygen is 96% on RA today.  He does have a recurrent right pleural effusion.  He is not in any respiratory distress.  He does not feel like it needs to emergently be drained right now and is hoping to make a decision regarding Pleurx versus thoracentesis soon (in the next few days).  He knows that if he feels like his pleural effusion needs to be drained, not to wait until he is having significant shortness  of breath that would require emergency room evaluation.  I also reinforced with the patient and his wife to please call the office if he feels like he needs another thoracentesis to speak with someone in person, as MyChart messages are more appropriate for nonemergent questions.  The patient was advised to call immediately if he has any concerning symptoms in the interval. The patient voices understanding of current disease status and treatment options and is in agreement with the current care plan. All questions were answered. The patient knows to call the clinic with any problems, questions or concerns. We can certainly see the patient much sooner if necessary       No orders of the defined types were placed in this encounter.     The total time spent in the appointment was 20-29 minutes.   Dula Havlik L Kamron Vanwyhe, PA-C 12/31/21

## 2021-12-30 ENCOUNTER — Encounter: Payer: Self-pay | Admitting: Pulmonary Disease

## 2021-12-30 DIAGNOSIS — E236 Other disorders of pituitary gland: Secondary | ICD-10-CM | POA: Diagnosis not present

## 2021-12-30 DIAGNOSIS — R7989 Other specified abnormal findings of blood chemistry: Secondary | ICD-10-CM | POA: Diagnosis not present

## 2021-12-31 ENCOUNTER — Other Ambulatory Visit: Payer: Medicare Other

## 2021-12-31 ENCOUNTER — Encounter: Payer: Self-pay | Admitting: Physician Assistant

## 2021-12-31 ENCOUNTER — Inpatient Hospital Stay: Payer: Medicare Other

## 2021-12-31 ENCOUNTER — Inpatient Hospital Stay (HOSPITAL_BASED_OUTPATIENT_CLINIC_OR_DEPARTMENT_OTHER): Payer: Medicare Other | Admitting: Physician Assistant

## 2021-12-31 ENCOUNTER — Inpatient Hospital Stay: Payer: Medicare Other | Attending: Internal Medicine

## 2021-12-31 ENCOUNTER — Ambulatory Visit: Payer: Medicare Other | Admitting: Internal Medicine

## 2021-12-31 ENCOUNTER — Other Ambulatory Visit: Payer: Self-pay

## 2021-12-31 ENCOUNTER — Ambulatory Visit: Payer: Medicare Other

## 2021-12-31 VITALS — BP 134/66 | HR 80 | Temp 98.6°F | Resp 18 | Wt 290.8 lb

## 2021-12-31 DIAGNOSIS — J90 Pleural effusion, not elsewhere classified: Secondary | ICD-10-CM | POA: Insufficient documentation

## 2021-12-31 DIAGNOSIS — R059 Cough, unspecified: Secondary | ICD-10-CM | POA: Diagnosis not present

## 2021-12-31 DIAGNOSIS — Z5111 Encounter for antineoplastic chemotherapy: Secondary | ICD-10-CM | POA: Diagnosis not present

## 2021-12-31 DIAGNOSIS — C3411 Malignant neoplasm of upper lobe, right bronchus or lung: Secondary | ICD-10-CM | POA: Insufficient documentation

## 2021-12-31 DIAGNOSIS — Z95828 Presence of other vascular implants and grafts: Secondary | ICD-10-CM

## 2021-12-31 LAB — CMP (CANCER CENTER ONLY)
ALT: 16 U/L (ref 0–44)
AST: 18 U/L (ref 15–41)
Albumin: 4 g/dL (ref 3.5–5.0)
Alkaline Phosphatase: 54 U/L (ref 38–126)
Anion gap: 6 (ref 5–15)
BUN: 16 mg/dL (ref 8–23)
CO2: 31 mmol/L (ref 22–32)
Calcium: 9.2 mg/dL (ref 8.9–10.3)
Chloride: 104 mmol/L (ref 98–111)
Creatinine: 1.21 mg/dL (ref 0.61–1.24)
GFR, Estimated: >60 mL/min (ref >60)
Glucose, Bld: 175 mg/dL — ABNORMAL HIGH (ref 70–99)
Potassium: 4 mmol/L (ref 3.5–5.1)
Sodium: 141 mmol/L (ref 135–145)
Total Bilirubin: 0.3 mg/dL (ref 0.3–1.2)
Total Protein: 6.2 g/dL — ABNORMAL LOW (ref 6.5–8.1)

## 2021-12-31 LAB — CBC WITH DIFFERENTIAL (CANCER CENTER ONLY)
Abs Immature Granulocytes: 0.03 10*3/uL (ref 0.00–0.07)
Basophils Absolute: 0 10*3/uL (ref 0.0–0.1)
Basophils Relative: 1 %
Eosinophils Absolute: 0.1 10*3/uL (ref 0.0–0.5)
Eosinophils Relative: 3 %
HCT: 35.4 % — ABNORMAL LOW (ref 39.0–52.0)
Hemoglobin: 11.5 g/dL — ABNORMAL LOW (ref 13.0–17.0)
Immature Granulocytes: 1 %
Lymphocytes Relative: 21 %
Lymphs Abs: 0.6 10*3/uL — ABNORMAL LOW (ref 0.7–4.0)
MCH: 29.5 pg (ref 26.0–34.0)
MCHC: 32.5 g/dL (ref 30.0–36.0)
MCV: 90.8 fL (ref 80.0–100.0)
Monocytes Absolute: 0.6 10*3/uL (ref 0.1–1.0)
Monocytes Relative: 20 %
Neutro Abs: 1.6 10*3/uL — ABNORMAL LOW (ref 1.7–7.7)
Neutrophils Relative %: 54 %
Platelet Count: 226 10*3/uL (ref 150–400)
RBC: 3.9 MIL/uL — ABNORMAL LOW (ref 4.22–5.81)
RDW: 15.1 % (ref 11.5–15.5)
WBC Count: 3 10*3/uL — ABNORMAL LOW (ref 4.0–10.5)
nRBC: 0 % (ref 0.0–0.2)

## 2021-12-31 MED ORDER — HEPARIN SOD (PORK) LOCK FLUSH 100 UNIT/ML IV SOLN
500.0000 [IU] | Freq: Once | INTRAVENOUS | Status: AC | PRN
  Administered 2021-12-31: 500 [IU]

## 2021-12-31 MED ORDER — HYDROCOD POLST-CPM POLST ER 10-8 MG/5ML PO SUER: 5.0000 mL | Freq: Every evening | ORAL | 0 refills | Status: DC | PRN

## 2021-12-31 MED ORDER — PALONOSETRON HCL INJECTION 0.25 MG/5ML
0.2500 mg | Freq: Once | INTRAVENOUS | Status: AC
  Administered 2021-12-31: 0.25 mg via INTRAVENOUS
  Filled 2021-12-31: qty 5

## 2021-12-31 MED ORDER — SODIUM CHLORIDE 0.9% FLUSH
10.0000 mL | INTRAVENOUS | Status: DC | PRN
  Administered 2021-12-31: 10 mL

## 2021-12-31 MED ORDER — SODIUM CHLORIDE 0.9 % IV SOLN
10.0000 mg | Freq: Once | INTRAVENOUS | Status: AC
  Administered 2021-12-31: 10 mg via INTRAVENOUS
  Filled 2021-12-31: qty 10

## 2021-12-31 MED ORDER — SODIUM CHLORIDE 0.9 % IV SOLN: Freq: Once | INTRAVENOUS | Status: AC

## 2021-12-31 MED ORDER — SODIUM CHLORIDE 0.9 % IV SOLN
3.1400 mg/m2 | Freq: Once | INTRAVENOUS | Status: AC
  Administered 2021-12-31: 8 mg via INTRAVENOUS
  Filled 2021-12-31: qty 16

## 2021-12-31 MED ORDER — SODIUM CHLORIDE 0.9% FLUSH
10.0000 mL | Freq: Once | INTRAVENOUS | Status: AC
  Administered 2021-12-31: 10 mL

## 2021-12-31 NOTE — Patient Instructions (Signed)
[facility]  Discharge Instructions: Thank you for choosing [facility] to provide your oncology and hematology care.   If you have a lab appointment with the [facility], please go directly to the [facility] and check in at the registration area.   Wear comfortable clothing and clothing appropriate for easy access to any Portacath or PICC line.   We strive to give you quality time with your provider. You may need to reschedule your appointment if you arrive late (15 or more minutes).  Arriving late affects you and other patients whose appointments are after yours.  Also, if you miss three or more appointments without notifying the office, you may be dismissed from the clinic at the providers discretion.      For prescription refill requests, have your pharmacy contact our office and allow 72 hours for refills to be completed.    Today you received the following chemotherapy and/or immunotherapy agents: Zepzelca    To help prevent nausea and vomiting after your treatment, we encourage you to take your nausea medication as directed.  BELOW ARE SYMPTOMS THAT SHOULD BE REPORTED IMMEDIATELY: *FEVER GREATER THAN 100.4 F (38 C) OR HIGHER *CHILLS OR SWEATING *NAUSEA AND VOMITING THAT IS NOT CONTROLLED WITH YOUR NAUSEA MEDICATION *UNUSUAL SHORTNESS OF BREATH *UNUSUAL BRUISING OR BLEEDING *URINARY PROBLEMS (pain or burning when urinating, or frequent urination) *BOWEL PROBLEMS (unusual diarrhea, constipation, pain near the anus) TENDERNESS IN MOUTH AND THROAT WITH OR WITHOUT PRESENCE OF ULCERS (sore throat, sores in mouth, or a toothache) UNUSUAL RASH, SWELLING OR PAIN  UNUSUAL VAGINAL DISCHARGE OR ITCHING   Items with * indicate a potential emergency and should be followed up as soon as possible or go to the Emergency Department if any problems should occur.  Please show the CHEMOTHERAPY ALERT CARD or IMMUNOTHERAPY ALERT CARD at check-in to the  Emergency Department and triage nurse.  Should you have questions after your visit or need to cancel or reschedule your appointment, please contact [facility]  Dept: [phone]  and follow the prompts.  Office hours are 8:00 a.m. to 4:30 p.m. Monday - Friday. Please note that voicemails left after 4:00 p.m. may not be returned until the following business day.  We are closed weekends and major holidays. You have access to a nurse at all times for urgent questions. Please call the main number to the clinic Dept: [phone] and follow the prompts.   For any non-urgent questions, you may also contact your provider using MyChart. We now offer e-Visits for anyone 72 and older to request care online for non-urgent symptoms. For details visit [email or web address].   Also download the MyChart app! Go to the app store, search "MyChart", open the app, select [facility], and log in with your MyChart username and password.  Due to Covid, a mask is required upon entering the hospital/clinic. If you do not have a mask, one will be given to you upon arrival. For doctor visits, patients may have 1 support person aged 45 or older with them. For treatment visits, patients cannot have anyone with them due to current Covid guidelines and our immunocompromised population.

## 2021-12-31 NOTE — Telephone Encounter (Signed)
Received the following message from patient and his wife:  "Adam Wise - DOB 1951/11/19....this is Adam Wise's wife Adam Wise, with his permission. OV on 12/13/2021 Appreciated OV time with you to discuss possible Talc procedure to help Adam Wise's possible prevent future needs to have fluid drained on outside of his lung/shortness of breath.  You referred [place] to Dr.Lightfoot (thinking that he did do this procedure) but as we disappointingly found out, he has never done one of these procedures.  We are still in hopes of collecting more information on this and would appreciate any help you could give [place] in finding a doctor who does, in fact, have  experience in this procedure.   We are available, and will to come in, for another OV to discuss this in person if you prefer this.......just let us know and we'll make an appointment.  Thank you so much for your willingness to discuss and support [place] in this area. Adam Wise and Adam Wise"  Dr. Valeta Harms, can you please advise? Thanks!

## 2022-01-01 NOTE — Telephone Encounter (Signed)
I have made the follow up appointment for this patient.

## 2022-01-02 ENCOUNTER — Other Ambulatory Visit: Payer: Self-pay | Admitting: Primary Care

## 2022-01-02 DIAGNOSIS — I10 Essential (primary) hypertension: Secondary | ICD-10-CM

## 2022-01-03 ENCOUNTER — Other Ambulatory Visit: Payer: Self-pay | Admitting: Medical Oncology

## 2022-01-03 ENCOUNTER — Ambulatory Visit (HOSPITAL_COMMUNITY)
Admission: RE | Admit: 2022-01-03 | Discharge: 2022-01-03 | Disposition: A | Discharge status: Home / Self Care | Payer: Medicare Other | Source: Ambulatory Visit | Attending: Physician Assistant | Admitting: Physician Assistant

## 2022-01-03 ENCOUNTER — Telehealth: Payer: Self-pay | Admitting: Medical Oncology

## 2022-01-03 ENCOUNTER — Other Ambulatory Visit (HOSPITAL_COMMUNITY): Payer: Self-pay | Admitting: Student

## 2022-01-03 ENCOUNTER — Other Ambulatory Visit: Payer: Self-pay

## 2022-01-03 ENCOUNTER — Ambulatory Visit (HOSPITAL_COMMUNITY)
Admission: RE | Admit: 2022-01-03 | Discharge: 2022-01-03 | Disposition: A | Discharge status: Home / Self Care | Payer: Medicare Other | Source: Ambulatory Visit | Attending: Student | Admitting: Student

## 2022-01-03 DIAGNOSIS — J90 Pleural effusion, not elsewhere classified: Secondary | ICD-10-CM

## 2022-01-03 HISTORY — PX: IR THORACENTESIS ASP PLEURAL SPACE W/IMG GUIDE: IMG5380

## 2022-01-03 MED ORDER — LIDOCAINE HCL 1 % IJ SOLN
INTRAMUSCULAR | Status: AC
  Filled 2022-01-03: qty 20

## 2022-01-03 NOTE — Telephone Encounter (Signed)
Pt is requesting a CXR /possible Thoracentesis-He is having worsening SOB.

## 2022-01-03 NOTE — Procedures (Signed)
PROCEDURE SUMMARY:  Successful US guided right thoracentesis. Yielded 1750 ml of amber-colored fluid. Pt tolerated procedure well. No immediate complications.  CXR ordered; no post-procedure pneumothorax identified  EBL < 2 mL  Theresa Duty, NP 01/03/2022 1:04 PM

## 2022-01-09 NOTE — Progress Notes (Deleted)
[facility] OFFICE PROGRESS NOTE  [name], NP [place] [id number]  DIAGNOSIS: Relapsed extensive stage (T4, N3, M1c)  small cell lung cancer diagnosed in April 2021 and presented with large right upper lobe/right hilar mass with ipsilateral and contralateral mediastinal and right supraclavicular lymphadenopathy in addition to multiple liver lesions. He has disease progression Adam the first line of chemotherapy in December 2021.  PRIOR THERAPY: 1) Palliative radiotherapy to the right upper lobe lung mass under the care of Dr. Lisbeth Renshaw. 2) Systemic chemotherapy with carboplatin for AUC of 5 on day 1, etoposide 100 mg/M2 on days 1, 2 and 3 in addition to Imfinzi 1500 mg IV every 3 weeks with chemotherapy then every 4 weeks for maintenance if the Adam has no evidence for progression.  He will also receive Cosela 240 mg/m2 on the days of the chemotherapy.  Status post 9 cycles.  Starting from cycle #5 the Adam will be on maintenance treatment with immunotherapy with Imfinzi 1500 mg IV every 4 weeks. Last dose of chemotherapy was given on November 13, 2020. This treatment was discontinued secondary to disease progression 3) Systemic chemotherapy with carboplatin for AUC of 5 on day 1, etoposide 100 mg/M2 on days 1, 2 and 3, Tecentriq 1200 mg IV every 3 weeks as well as Cosela 250 mg/M2 on the days of the chemotherapy every 3 weeks.  First dose December 18, 2020.  Status post 8 cycles.  CURRENT THERAPY: Zepzelca 3.2 mgm2 IV every 3 weeks. First dose expected on 06/05/21. Status post 11 cycles   INTERVAL HISTORY: Adam Wise 71 y.o. male returns to clinic today for Wise visit accompanied by his wife.  Adam is feeling fairly well today without any concerning complaints.  The Adam has been contemplating his options regarding his recurrent pleural effusions.  His most recent thoracentesis was on 01/03/2022 which 1.75 L of fluid was obtained from the right  lung. He notices significant improvement in his cough, chest heaviness, and shortness of breath Adam having a thoracentesis performed.  He was referred to pulmonary medicine for consideration of Pleurx catheter placement.  He also wants to know what his surgical options are regarding talc pleurodesis.  He had surgical consultation as well as consultation with Dr. Valeta Harms from pulmonology. With Dr. Valeta Harms on 01/15/2022 who discussed ***.   Overall the Adam denies any fever, chills, Adam night sweats.  His weight is stable.  He is compliant with his blood thinners for his history of pulmonary embolisms.  Denies any chest pain Adam hemoptysis.  Cough?  He has Tussionex if needed.  Shortness of breath?  Denies any headache Adam visual changes.  He reports some increased blurry vision secondary to being overdue for his annual eye exam for his diabetes.  Denies any nausea, vomiting, diarrhea, Adam constipation.  Denies any headache Adam visual changes.  The Adam is here today for evaluation and repeat blood work before starting his next cycle of treatment.     MEDICAL HISTORY: Past Medical History:  Diagnosis Date   Chickenpox    Chronic knee pain    Chronic low back pain    Coronary artery calcification seen on CAT scan 11/2021   Coronary CTA 11/27/2021: Moderate to large right pleural effusion and compressive atelectasis right lung base. => Coronary Calcium Score 657.  Diffuse RCA calcification.  Minimal mild disease in the LAD and diagonal branches. == Overall limited study.  Notable artifact.   Essential hypertension  GERD (gastroesophageal reflux disease)    Hyperlipidemia    OSA (obstructive sleep apnea)    With nighttime oxygen supplementation   T4, M3, M1 C Metastatic Small Cell Lung Cancer 03/2020   large right upper lobe/right hilar mass with ipsilateral and contralateral mediastinal and right supraclavicular lymphadenopathy in addition to multiple liver lesios. He has disease progression Adam  the first line of chemotherapy in December 2021.   Testosterone deficiency    Type 2 diabetes mellitus (HCC)     ALLERGIES:  is allergic to bupropion and hydrochlorothiazide.  MEDICATIONS:  Current Outpatient Medications  Medication Sig Dispense Refill   albuterol (VENTOLIN HFA) 108 (90 Base) MCG/ACT inhaler Inhale 2 puffs into the lungs every 6 (six) hours as needed for wheezing Adam shortness of breath. 18 g 0   amLODipine (NORVASC) 10 MG tablet TAKE 1 TABLET BY MOUTH EVERY DAY FOR BLOOD PRESSURE 90 tablet 3   B-D 3CC LUER-LOK SYR 22GX1" 22G X 1" 3 ML MISC USE AS INSTRUCTED FOR TESTOSTERONE INJECTION EVERY 2 WEEKS 10 each 2   bisoprolol (ZEBETA) 5 MG tablet Take 1 tablet (5 mg total) by mouth daily. 30 tablet 6   Black Pepper-Turmeric (TURMERIC COMPLEX/BLACK PEPPER PO) Take 1 tablet by mouth in the morning and at bedtime.     blood glucose meter kit and supplies KIT Dispense based on Adam and insurance preference. Use up to four times daily as directed. 1 each 0   Budeson-Glycopyrrol-Formoterol (BREZTRI AEROSPHERE) 160-9-4.8 MCG/ACT AERO Inhale 2 puffs into the lungs in the morning and at bedtime. 10.7 g 0   chlorpheniramine-HYDROcodone (TUSSIONEX) 10-8 MG/5ML SUER Take 5 mLs by mouth at bedtime as needed for cough. 473 mL 0   ELIQUIS 5 MG TABS tablet TAKE 1 TABLET BY MOUTH TWICE A DAY 180 tablet 2   esomeprazole (NEXIUM) 20 MG capsule Take 20 mg by mouth daily.      gabapentin (NEURONTIN) 300 MG capsule Take 300 mg by mouth at bedtime.     glipiZIDE (GLUCOTROL) 10 MG tablet TAKE 1 TABLET (10 MG TOTAL) BY MOUTH 2 (TWO) TIMES DAILY BEFORE A MEAL. FOR DIABETES. 180 tablet 3   Insulin Glargine (BASAGLAR KWIKPEN) 100 UNIT/ML Inject 20 Units into the skin at bedtime. For diabetes. 15 mL 3   Insulin Pen Needle (BD PEN NEEDLE NANO U/F) 32G X 4 MM MISC Use with insulin as prescribed  Dx Code: E11.9 100 each 2   ipratropium-albuterol (DUONEB) 0.5-2.5 (3) MG/3ML SOLN Take 3 mLs by nebulization  every 4 (four) hours while awake for 3 days, THEN 3 mLs every 4 (four) hours as needed (shortness of breath Adam wheezing). 360 mL 0   Lancets MISC USE UP TO 3 TIMES DAILY AS DIRECTED 100 each 2   lidocaine-prilocaine (EMLA) cream Apply 1 application topically as needed. 30 g 2   losartan (COZAAR) 50 MG tablet Take 1 tablet (50 mg total) by mouth daily. For blood pressure. 90 tablet 3   Melatonin 10 MG CAPS Take 10 mg by mouth at bedtime.     metFORMIN (GLUCOPHAGE) 1000 MG tablet TAKE 1 TABLET (1,000 MG TOTAL) BY MOUTH 2 (TWO) TIMES DAILY WITH A MEAL. FOR DIABETES 180 tablet 1   Multiple Vitamin (MULTI-VITAMINS) TABS Take 1 tablet by mouth daily.      Omega-3 Fatty Acids (SALMON OIL PO) Take 1 capsule by mouth daily.     ONETOUCH ULTRA test strip USE UP TO 4 TIMES DAILY AS DIRECTED 100 strip 5  oxymetazoline (AFRIN) 0.05 % nasal spray Place 1 spray into both nostrils 2 (two) times daily as needed for congestion.     pioglitazone (ACTOS) 45 MG tablet TAKE 1 TABLET (45 MG TOTAL) BY MOUTH DAILY. FOR DIABETES. 90 tablet 0   pravastatin (PRAVACHOL) 40 MG tablet TAKE 1 TABLET BY MOUTH EVERY DAY IN THE EVENING FOR CHOLESTEROL 90 tablet 1   pseudoephedrine-acetaminophen (TYLENOL SINUS) 30-500 MG TABS tablet Take 2 tablets by mouth at bedtime.     testosterone cypionate (DEPOTESTOSTERONE CYPIONATE) 200 MG/ML injection Inject 200 mg into the muscle every 14 (fourteen) days.     No current facility-administered medications for this visit.    SURGICAL HISTORY:  Past Surgical History:  Procedure Laterality Date   COLONOSCOPY WITH PROPOFOL N/A 12/17/2018   Procedure: COLONOSCOPY WITH PROPOFOL;  Surgeon: Virgel Manifold, MD;  Location: [facility];  Service: Gastroenterology;  Laterality: N/A;   IR IMAGING GUIDED PORT INSERTION  04/17/2020   IR THORACENTESIS ASP PLEURAL SPACE W/IMG GUIDE  11/29/2021   IR THORACENTESIS ASP PLEURAL SPACE W/IMG GUIDE  01/03/2022   JOINT REPLACEMENT Bilateral     REPLACEMENT TOTAL KNEE BILATERAL  2015   TONSILLECTOMY  1960   TRANSTHORACIC ECHOCARDIOGRAM  05/2020   a) 05/2020: EF 55 to 60%.  No R WMA.  Mild LVH.  Indeterminate LVEDP.  Unable to assess RVP.  Normal aortic and mitral valves.  Mildly elevated RAP.; b) 09/2021: EF 50-55%. No RWMA. Mild LVH. ~ LVEDP. Mild LA Dilation. NORMAL RV/RAP.  Normal MV/AoV.    REVIEW OF SYSTEMS:   Review of Systems  Constitutional: Negative for appetite change, chills, fatigue, fever and unexpected weight change.  HENT:   Negative for mouth sores, nosebleeds, sore throat and trouble swallowing.   Eyes: Negative for eye problems and icterus.  Respiratory: Negative for cough, hemoptysis, shortness of breath and wheezing.   Cardiovascular: Negative for chest pain and leg swelling.  Gastrointestinal: Negative for abdominal pain, constipation, diarrhea, nausea and vomiting.  Genitourinary: Negative for bladder incontinence, difficulty urinating, dysuria, frequency and hematuria.   Musculoskeletal: Negative for back pain, gait problem, neck pain and neck stiffness.  Skin: Negative for itching and rash.  Neurological: Negative for dizziness, extremity weakness, gait problem, headaches, light-headedness and seizures.  Hematological: Negative for adenopathy. Does not bruise/bleed easily.  Psychiatric/Behavioral: Negative for confusion, depression and sleep disturbance. The Adam is not nervous/anxious.     PHYSICAL EXAMINATION:  There were no vitals taken for this visit.  ECOG PERFORMANCE STATUS: {CHL ONC ECOG [id number]  Physical Exam  Constitutional: Oriented to person, place, and time and well-developed, well-nourished, and in no distress. No distress.  HENT:  Head: Normocephalic and atraumatic.  Mouth/Throat: Oropharynx is clear and moist. No oropharyngeal exudate.  Eyes: Conjunctivae are normal. Right eye exhibits no discharge. Left eye exhibits no discharge. No scleral icterus.  Neck: Normal range of  motion. Neck supple.  Cardiovascular: Normal rate, regular rhythm, normal heart sounds and intact distal pulses.   Pulmonary/Chest: Effort normal and breath sounds normal. No respiratory distress. No wheezes. No rales.  Abdominal: Soft. Bowel sounds are normal. Exhibits no distension and no mass. There is no tenderness.  Musculoskeletal: Normal range of motion. Exhibits no edema.  Lymphadenopathy:    No cervical adenopathy.  Neurological: Alert and oriented to person, place, and time. Exhibits normal muscle tone. Gait normal. Coordination normal.  Skin: Skin is warm and dry. No rash noted. Not diaphoretic. No erythema. No pallor.  Psychiatric: Mood, memory and  judgment normal.  Vitals reviewed.  LABORATORY DATA: Lab Results  Component Value Date   WBC 3.0 (L) 12/31/2021   HGB 11.5 (L) 12/31/2021   HCT 35.4 (L) 12/31/2021   MCV 90.8 12/31/2021   PLT 226 12/31/2021      Chemistry      Component Value Date/Time   NA 141 12/31/2021 1045   K 4.0 12/31/2021 1045   CL 104 12/31/2021 1045   CO2 31 12/31/2021 1045   BUN 16 12/31/2021 1045   CREATININE 1.21 12/31/2021 1045   CREATININE 1.45 (H) 10/18/2021 1533      Component Value Date/Time   CALCIUM 9.2 12/31/2021 1045   ALKPHOS 54 12/31/2021 1045   AST 18 12/31/2021 1045   ALT 16 12/31/2021 1045   BILITOT 0.3 12/31/2021 1045       RADIOGRAPHIC STUDIES:  DG Chest 1 View  Result Date: 01/03/2022 CLINICAL DATA:  Post right thoracentesis EXAM: CHEST  1 VIEW COMPARISON:  12/20/2021 FINDINGS: Decreased right pleural effusion. No pneumothorax. Right upper lobe scarring again noted from prior cancer treatment. Left lung clear. Port-A-Cath tip at the cavoatrial junction IMPRESSION: No complication post right thoracentesis. Electronically Signed   By: Franchot Gallo M.D.   On: 01/03/2022 12:55   DG Chest 2 View  Result Date: 12/20/2021 CLINICAL DATA:  71 year old male with small cell lung cancer status post chemo radiation. EXAM:  CHEST - 2 VIEW COMPARISON:  Portable chest 11/29/2021 and earlier. FINDINGS: PA and lateral views. Stable right chest power port. Increasing dense and veiling opacity in the right lower lung compatible with some increase in the known right pleural effusion since last month. Superimposed extensive architectural distortion extending from the right hilum into the lung apex is stable. No superimposed pneumothorax Adam pulmonary edema. Other mediastinal contours are stable, within normal limits. Left lung appears stable and negative. Visualized tracheal air column is within normal limits. Stable visualized osseous structures. Negative visible bowel gas. IMPRESSION: 1. Increasing right pleural effusion since restaging CTs last month. 2. Otherwise stable post treatment appearance of the chest. Electronically Signed   By: Genevie Ann M.D.   On: 12/20/2021 09:01   IR THORACENTESIS ASP PLEURAL SPACE W/IMG GUIDE  Result Date: 01/03/2022 INDICATION: Adam with a history small cell lung cancer with recurrent right pleural effusions presents today for therapeutic thoracentesis. EXAM: ULTRASOUND GUIDED THORACENTESIS MEDICATIONS: 1% lidocaine 10 mL COMPLICATIONS: None immediate. PROCEDURE: An ultrasound guided thoracentesis was thoroughly discussed with the Adam and questions answered. The benefits, risks, alternatives and complications were also discussed. The Adam understands and wishes to proceed with the procedure. Written consent was obtained. Ultrasound was performed to localize and mark an adequate pocket of fluid in the right chest. The area was then prepped and draped in the normal sterile fashion. 1% Lidocaine was used for local anesthesia. Under ultrasound guidance a 6 Fr Safe-T-Centesis catheter was introduced. Thoracentesis was performed. The catheter was removed and a dressing applied. FINDINGS: A total of approximately 1750 mL of amber-colored fluid was removed. IMPRESSION: Successful ultrasound guided right  thoracentesis yielding 1750 mL of pleural fluid. Read by: Soyla Dryer, NP Electronically Signed   By: Jerilynn Mages.  Shick M.D.   On: 01/03/2022 13:18     ASSESSMENT/PLAN:  This is a very pleasant 71 year old Caucasian male diagnosed with extensive stage (T4, N3, M1C) small cell lung cancer presented with large right upper lobe lung mass in addition to mediastinal and right supraclavicular lymphadenopathy and multiple metastatic liver lesions diagnosed in  April 2021.   The Adam initially underwent systemic chemotherapy with carboplatin for an AUC of 5 on day 1, etoposide 100 mg per metered squared on days 1, 2, and 3 in addition to [facility] for myeloprotection.  He also received immunotherapy with Imfinzi on day 1 of every chemotherapy cycle.  He is status post 10 cycles.  Starting from cycle #5 he was on single agent immunotherapy with Imfinzi IV every 4 weeks   The Adam had evidence of disease progression on his scan from December 2021.  He was then started on systemic chemotherapy with carboplatin for an AUC of 5 on day 1, etoposide 100 mg per metered squared on days 1, 2, and 3 in addition to Tecentriq 1200 mg on day 1, the Adam is status post 8 cycles.  Starting from cycle #5, the Adam started maintenance immunotherapy with Tecentriq.  This was discontinued due to evidence of disease progression.   The Adam is currently undergoing palliative systemic chemotherapy with Zepzelca IV every 3 weeks.  He is status post his 11 cycles and he tolerated it well except for fatigue a few days Adam treatment.    The Adam's last scan showed: The liver lesions have decreased in size. There was some increase in prominent nodular soft tissue density today. Radiology commented that while this may be treatment related, close attention on Wise recommended as recurrent disease not excluded. We will need to continue to monitor this closely though on Wise imaging.  We will arrange follow up imaging  today prior to his next appointment.   The Adam was seen with Dr. Julien Nordmann today. Labs were reviewed. Recommend that he ***   We will see the Adam back for Wise visit in 3 weeks for evaluation before starting cycle #13.     Order scan  Pleural effusion.   The Adam was advised to call immediately if he has any concerning symptoms in the interval. The Adam voices understanding of current disease status and treatment options and is in agreement with the current care plan. All questions were answered. The Adam knows to call the clinic with any problems, questions Adam concerns. We can certainly see the Adam much sooner if necessary           No orders of the defined types were placed in this encounter.    I spent {CHL ONC TIME VISIT - MBBUY:[id number]} counseling the Adam face to face. The total time spent in the appointment was {CHL ONC TIME VISIT - FMMCR:[id number]}.  Elior Robinette L Brayley Mackowiak, PA-C 01/09/22

## 2022-01-15 DIAGNOSIS — E119 Type 2 diabetes mellitus without complications: Secondary | ICD-10-CM

## 2022-01-15 MED ORDER — BASAGLAR KWIKPEN 100 UNIT/ML ~~LOC~~ SOPN: 20.0000 [IU] | PEN_INJECTOR | Freq: Every day | SUBCUTANEOUS | 1 refills | Status: DC

## 2022-01-16 ENCOUNTER — Encounter: Payer: Self-pay | Admitting: Internal Medicine

## 2022-01-16 ENCOUNTER — Telehealth: Payer: Self-pay | Admitting: *Deleted

## 2022-01-16 ENCOUNTER — Telehealth: Payer: Self-pay | Admitting: Physician Assistant

## 2022-01-16 ENCOUNTER — Ambulatory Visit (INDEPENDENT_AMBULATORY_CARE_PROVIDER_SITE_OTHER): Payer: Medicare Other

## 2022-01-16 ENCOUNTER — Other Ambulatory Visit: Payer: Self-pay

## 2022-01-16 ENCOUNTER — Ambulatory Visit (INDEPENDENT_AMBULATORY_CARE_PROVIDER_SITE_OTHER): Payer: Medicare Other | Admitting: Pulmonary Disease

## 2022-01-16 ENCOUNTER — Encounter: Payer: Self-pay | Admitting: Pulmonary Disease

## 2022-01-16 VITALS — BP 128/78 | HR 100 | Temp 99.5°F | Ht 69.0 in | Wt 294.6 lb

## 2022-01-16 DIAGNOSIS — J90 Pleural effusion, not elsewhere classified: Secondary | ICD-10-CM

## 2022-01-16 DIAGNOSIS — J449 Chronic obstructive pulmonary disease, unspecified: Secondary | ICD-10-CM | POA: Diagnosis not present

## 2022-01-16 DIAGNOSIS — C3411 Malignant neoplasm of upper lobe, right bronchus or lung: Secondary | ICD-10-CM

## 2022-01-16 NOTE — H&P (View-Only) (Signed)
Synopsis: Referred in December 2022 for recurrent pleural effusion by Doreene Nest, NP  Subjective:   PATIENT ID: Adam Wise GENDER: male DOB: [birth year], MRN: [id number]  Chief Complaint  Patient presents with   Follow-up    Increased SOB, wheezing since thora done 01/03/22. Neb helps some. He stopped Breztri bc it did not seem to be helping.     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  Patient was completed palliative radiotherapy and systemic chemotherapy followed by Dr. Shirline Frees.  Patient unfortunately has had a recurrent right-sided effusion.  He has had 6 thoracentesis in the past.  Patient was referred today to discuss possibility of Pleurx catheter placement or consideration for other options of recurrent pleural effusion.  He has a longtime history of tobacco use.  Currently using albuterol only.  Was placed on Anoro in the past with no significant improvement.  When you know if there is other inhaler options.  OV 01/16/2022: Patient was seen today for follow-up after recent surgical consultation.  Dr. Cliffton Asters felt as if risk for thoracotomy were too high and concern for BPF if operating on him.  Therefore we are here today to discuss pigtail placement for talc pleurodesis versus a indwelling pleural catheter placement.  Patient would like to proceed with talc pleurodesis.  We talked about the risk benefits of both today in the office.  He does have increasing shortness of breath.  Ultrasound completed also reveals persistent right-sided fluid accumulation today in the office.  He also had a two-view chest x-ray confirming this.  We reviewed this today in the office as well.   Past Medical History:  Diagnosis Date   Chickenpox    Chronic knee pain     Chronic low back pain    Coronary artery calcification seen on CAT scan 11/2021   Coronary CTA 11/27/2021: Moderate to large right pleural effusion and compressive atelectasis right lung base. => Coronary Calcium Score 657.  Diffuse RCA calcification.  Minimal mild disease in the LAD and diagonal branches. == Overall limited study.  Notable artifact.   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 He has disease progression after the first line of chemotherapy in December 2021.   Testosterone deficiency    Type 2 diabetes mellitus (HCC)      Family History  Problem Relation Age of Onset   Heart attack Mother    Cancer Mother    Hypertension Mother    Arthritis Father    Asthma Father    Cancer Father    COPD Father    Heart attack Father    Hypertension Sister    Cancer Sister    Diabetes Sister    Asthma Sister    Cancer Sister    COPD Sister    Arthritis Sister    Asthma Sister    Diabetes Sister    Birth defects Maternal Grandfather    Arthritis Paternal Grandmother    Diabetes Paternal Grandmother  Arthritis Paternal Grandfather    Asthma Son    Other Neg Hx        pituitary disorder     Past Surgical History:  Procedure Laterality Date   COLONOSCOPY WITH PROPOFOL N/A 12/17/2018   Procedure: COLONOSCOPY WITH PROPOFOL;  Surgeon: Virgel Manifold, MD;  Location: [facility];  Service: Gastroenterology;  Laterality: N/A;   IR IMAGING GUIDED PORT INSERTION  04/17/2020   IR THORACENTESIS ASP PLEURAL SPACE W/IMG GUIDE  11/29/2021   IR THORACENTESIS ASP PLEURAL SPACE W/IMG GUIDE  01/03/2022   JOINT REPLACEMENT Bilateral     REPLACEMENT TOTAL KNEE BILATERAL  2015   TONSILLECTOMY  1960   TRANSTHORACIC ECHOCARDIOGRAM  05/2020   a) 05/2020: EF 55 to 60%.  No R WMA.  Mild LVH.  Indeterminate LVEDP.  Unable to assess RVP.  Normal aortic and mitral valves.  Mildly elevated RAP.; b) 09/2021: EF 50-55%. No RWMA. Mild LVH. ~ LVEDP. Mild LA Dilation. NORMAL RV/RAP.  Normal MV/AoV.    Social History   Socioeconomic History   Marital status: Married    Spouse name: Not on file   Number of children: Not on file   Years of education: Not on file   Highest education level: Not on file  Occupational History   Not on file  Tobacco Use   Smoking status: Former   Smokeless tobacco: Never  Vaping Use   Vaping Use: Never used  Substance and Sexual Activity   Alcohol use: Yes    Comment: rarely   Drug use: Not Currently   Sexual activity: Yes  Other Topics Concern   Not on file  Social History Narrative   Married.   Moved from [place].   Retired.   Social Determinants of Health   Financial Resource Strain: Low Risk    Difficulty of Paying Living Expenses: Not hard at all  Food Insecurity: No Food Insecurity   Worried About Charity fundraiser in the Last Year: Never true   [place] in the Last Year: Never true  Transportation Needs: No Transportation Needs   Lack of Transportation (Medical): No   Lack of Transportation (Non-Medical): No  Physical Activity: Inactive   Days of Exercise per Week: 0 days   Minutes of Exercise per Session: 0 min  Stress: No Stress Concern Present   Feeling of Stress : Not at all  Social Connections: Not on file  Intimate Partner Violence: Not At Risk   Fear of Current or Ex-Partner: No   Emotionally Abused: No   Physically Abused: No   Sexually Abused: No     Allergies  Allergen Reactions   Bupropion     Racing heart   Hydrochlorothiazide Other (See Comments)    Cramping to lower extremities     Outpatient Medications Prior to Visit  Medication Sig Dispense Refill    albuterol (VENTOLIN HFA) 108 (90 Base) MCG/ACT inhaler Inhale 2 puffs into the lungs every 6 (six) hours as needed for wheezing or shortness of breath. 18 g 0   amLODipine (NORVASC) 10 MG tablet TAKE 1 TABLET BY MOUTH EVERY DAY FOR BLOOD PRESSURE 90 tablet 3   B-D 3CC LUER-LOK SYR 22GX1" 22G X 1" 3 ML MISC USE AS INSTRUCTED FOR TESTOSTERONE INJECTION EVERY 2 WEEKS 10 each 2   bisoprolol (ZEBETA) 5 MG tablet Take 1 tablet (5 mg total) by mouth daily. 30 tablet 6   Black Pepper-Turmeric (TURMERIC COMPLEX/BLACK PEPPER PO) Take 1 tablet by mouth  in the morning and at bedtime.     blood glucose meter kit and supplies KIT Dispense based on patient and insurance preference. Use up to four times daily as directed. 1 each 0   chlorpheniramine-HYDROcodone (TUSSIONEX) 10-8 MG/5ML SUER Take 5 mLs by mouth at bedtime as needed for cough. 473 mL 0   ELIQUIS 5 MG TABS tablet TAKE 1 TABLET BY MOUTH TWICE A DAY 180 tablet 2   esomeprazole (NEXIUM) 20 MG capsule Take 20 mg by mouth daily.      gabapentin (NEURONTIN) 300 MG capsule Take 300 mg by mouth at bedtime.     glipiZIDE (GLUCOTROL) 10 MG tablet TAKE 1 TABLET (10 MG TOTAL) BY MOUTH 2 (TWO) TIMES DAILY BEFORE A MEAL. FOR DIABETES. 180 tablet 3   Insulin Glargine (BASAGLAR KWIKPEN) 100 UNIT/ML Inject 20 Units into the skin at bedtime. For diabetes. 30 mL 1   Insulin Pen Needle (BD PEN NEEDLE NANO U/F) 32G X 4 MM MISC Use with insulin as prescribed  Dx Code: E11.9 100 each 2   Lancets MISC USE UP TO 3 TIMES DAILY AS DIRECTED 100 each 2   lidocaine-prilocaine (EMLA) cream Apply 1 application topically as needed. 30 g 2   losartan (COZAAR) 50 MG tablet Take 1 tablet (50 mg total) by mouth daily. For blood pressure. 90 tablet 3   Melatonin 10 MG CAPS Take 10 mg by mouth at bedtime.     metFORMIN (GLUCOPHAGE) 1000 MG tablet TAKE 1 TABLET (1,000 MG TOTAL) BY MOUTH 2 (TWO) TIMES DAILY WITH A MEAL. FOR DIABETES 180 tablet 1   Multiple Vitamin (MULTI-VITAMINS) TABS  Take 1 tablet by mouth daily.      Omega-3 Fatty Acids (SALMON OIL PO) Take 1 capsule by mouth daily.     ONETOUCH ULTRA test strip USE UP TO 4 TIMES DAILY AS DIRECTED 100 strip 5   oxymetazoline (AFRIN) 0.05 % nasal spray Place 1 spray into both nostrils 2 (two) times daily as needed for congestion.     pioglitazone (ACTOS) 45 MG tablet TAKE 1 TABLET (45 MG TOTAL) BY MOUTH DAILY. FOR DIABETES. 90 tablet 0   pravastatin (PRAVACHOL) 40 MG tablet TAKE 1 TABLET BY MOUTH EVERY DAY IN THE EVENING FOR CHOLESTEROL 90 tablet 1   pseudoephedrine-acetaminophen (TYLENOL SINUS) 30-500 MG TABS tablet Take 2 tablets by mouth at bedtime.     testosterone cypionate (DEPOTESTOSTERONE CYPIONATE) 200 MG/ML injection Inject 200 mg into the muscle every 14 (fourteen) days.     ipratropium-albuterol (DUONEB) 0.5-2.5 (3) MG/3ML SOLN Take 3 mLs by nebulization every 4 (four) hours while awake for 3 days, THEN 3 mLs every 4 (four) hours as needed (shortness of breath or wheezing). 360 mL 0   Budeson-Glycopyrrol-Formoterol (BREZTRI AEROSPHERE) 160-9-4.8 MCG/ACT AERO Inhale 2 puffs into the lungs in the morning and at bedtime. 10.7 g 0   No facility-administered medications prior to visit.    Review of Systems  Constitutional:  Negative for chills, fever, malaise/fatigue and weight loss.  HENT:  Negative for hearing loss, sore throat and tinnitus.   Eyes:  Negative for blurred vision and double vision.  Respiratory:  Positive for shortness of breath. Negative for cough, hemoptysis, sputum production, wheezing and stridor.   Cardiovascular:  Negative for chest pain, palpitations, orthopnea, leg swelling and PND.  Gastrointestinal:  Negative for abdominal pain, constipation, diarrhea, heartburn, nausea and vomiting.  Genitourinary:  Negative for dysuria, hematuria and urgency.  Musculoskeletal:  Negative for joint pain and myalgias.  Skin:  Negative for itching and rash.  Neurological:  Negative for dizziness, tingling,  weakness and headaches.  Endo/Heme/Allergies:  Negative for environmental allergies. Does not bruise/bleed easily.  Psychiatric/Behavioral:  Negative for depression. The patient is not nervous/anxious and does not have insomnia.   All other systems reviewed and are negative.   Objective:  Physical Exam Vitals reviewed.  Constitutional:      General: He is not in acute distress.    Appearance: He is well-developed. He is obese.  HENT:     Head: Normocephalic and atraumatic.  Eyes:     General: No scleral icterus.    Conjunctiva/sclera: Conjunctivae normal.     Pupils: Pupils are equal, round, and reactive to light.  Neck:     Vascular: No JVD.     Trachea: No tracheal deviation.  Cardiovascular:     Rate and Rhythm: Normal rate and regular rhythm.     Heart sounds: Normal heart sounds. No murmur heard. Pulmonary:     Effort: Pulmonary effort is normal. No tachypnea, accessory muscle usage or respiratory distress.     Breath sounds: No stridor. No wheezing, rhonchi or rales.     Comments: Absent breath sounds in the right base Abdominal:     General: There is no distension.     Palpations: Abdomen is soft.     Tenderness: There is no abdominal tenderness.  Musculoskeletal:        General: No tenderness.     Cervical back: Neck supple.  Lymphadenopathy:     Cervical: No cervical adenopathy.  Skin:    General: Skin is warm and dry.     Capillary Refill: Capillary refill takes less than 2 seconds.     Findings: No rash.  Neurological:     Mental Status: He is alert and oriented to person, place, and time.  Psychiatric:        Behavior: Behavior normal.     Vitals:   01/16/22 1348  BP: 128/78  Pulse: 100  Temp: 99.5 F (37.5 C)  TempSrc: Oral  SpO2: 97%  Weight: 294 lb 9.6 oz (133.6 kg)  Height: $Remove'5\' 9"'JbrcRfU$  (1.753 m)   97% on RA BMI Readings from Last 3 Encounters:  01/16/22 43.50 kg/m  12/31/21 42.94 kg/m  12/20/21 43.27 kg/m   Wt Readings from Last 3  Encounters:  01/16/22 294 lb 9.6 oz (133.6 kg)  12/31/21 290 lb 12.8 oz (131.9 kg)  12/20/21 293 lb (132.9 kg)     CBC    Component Value Date/Time   WBC 3.0 (L) 12/31/2021 1045   WBC 2.5 (L) 10/18/2021 1533   RBC 3.90 (L) 12/31/2021 1045   HGB 11.5 (L) 12/31/2021 1045   HCT 35.4 (L) 12/31/2021 1045   HCT 42.7 10/06/2017 1052   PLT 226 12/31/2021 1045   MCV 90.8 12/31/2021 1045   MCH 29.5 12/31/2021 1045   MCHC 32.5 12/31/2021 1045   RDW 15.1 12/31/2021 1045   LYMPHSABS 0.6 (L) 12/31/2021 1045   MONOABS 0.6 12/31/2021 1045   EOSABS 0.1 12/31/2021 1045   BASOSABS 0.0 12/31/2021 1045     Chest Imaging: Chest x-ray 11/29/2021: No significant effusion on the right following a thoracentesis, no pneumothorax.  Some scarring in the right base, density in the medial portion of the right upper lobe history of small cell with radiation. The patient's images have been independently reviewed by me.    Pulmonary Functions Testing Results: No flowsheet data found.  FeNO:   Pathology:  Echocardiogram:   Heart Catheterization:     Assessment & Plan:     ICD-10-CM   1. Recurrent pleural effusion  J90 DG Chest 2 View    Procedural/ Surgical Case Request: CHEST TUBE INSERTION    Admit to Inpatient (patient's expected length of stay will be greater than 2 midnights or inpatient only procedure)    2. Recurrent pleural effusion on right  J90     3. Small cell lung cancer, right upper lobe (HCC)  C34.11     4. Chronic obstructive pulmonary disease, unspecified COPD type ([place])  J44.9       Discussion: This is a 71 year old gentleman, COPD, stage IV small cell carcinoma, with a recurrent right-sided pleural effusion has been tapped 6 times.  We talked about the possibility of Pleurx catheter placement versus consideration for talc pleurodesis.  We also had him see thoracic surgery for consideration of thoracotomy.  He has been declined for surgery.  Plan: We talked about the  risk benefits of doing indwelling pleural catheter versus talc pleurodesis. Patient has chosen to undergo talc pleurodesis. We will plan this to be completed on Monday. We will request a bed at the hospital just in case he needs to be admitted following pigtail placement and endoscopy. Dr. Tamala Julian will do this procedure for [place] next week. I will help coordinate with him. Patient still patient to hold his Eliquis starting Saturday of this week. Procedure to be completed likely Monday afternoon.  Today in the office we arranged for admission to hospital.  Planning of procedure.  We appreciate staff's help in coordination of care.   Current Outpatient Medications:    albuterol (VENTOLIN HFA) 108 (90 Base) MCG/ACT inhaler, Inhale 2 puffs into the lungs every 6 (six) hours as needed for wheezing or shortness of breath., Disp: 18 g, Rfl: 0   amLODipine (NORVASC) 10 MG tablet, TAKE 1 TABLET BY MOUTH EVERY DAY FOR BLOOD PRESSURE, Disp: 90 tablet, Rfl: 3   B-D 3CC LUER-LOK SYR 22GX1" 22G X 1" 3 ML MISC, USE AS INSTRUCTED FOR TESTOSTERONE INJECTION EVERY 2 WEEKS, Disp: 10 each, Rfl: 2   bisoprolol (ZEBETA) 5 MG tablet, Take 1 tablet (5 mg total) by mouth daily., Disp: 30 tablet, Rfl: 6   Black Pepper-Turmeric (TURMERIC COMPLEX/BLACK PEPPER PO), Take 1 tablet by mouth in the morning and at bedtime., Disp: , Rfl:    blood glucose meter kit and supplies KIT, Dispense based on patient and insurance preference. Use up to four times daily as directed., Disp: 1 each, Rfl: 0   chlorpheniramine-HYDROcodone (TUSSIONEX) 10-8 MG/5ML SUER, Take 5 mLs by mouth at bedtime as needed for cough., Disp: 473 mL, Rfl: 0   ELIQUIS 5 MG TABS tablet, TAKE 1 TABLET BY MOUTH TWICE A DAY, Disp: 180 tablet, Rfl: 2   esomeprazole (NEXIUM) 20 MG capsule, Take 20 mg by mouth daily. , Disp: , Rfl:    gabapentin (NEURONTIN) 300 MG capsule, Take 300 mg by mouth at bedtime., Disp: , Rfl:    glipiZIDE (GLUCOTROL) 10 MG tablet, TAKE 1 TABLET  (10 MG TOTAL) BY MOUTH 2 (TWO) TIMES DAILY BEFORE A MEAL. FOR DIABETES., Disp: 180 tablet, Rfl: 3   Insulin Glargine (BASAGLAR KWIKPEN) 100 UNIT/ML, Inject 20 Units into the skin at bedtime. For diabetes., Disp: 30 mL, Rfl: 1   Insulin Pen Needle (BD PEN NEEDLE NANO U/F) 32G X 4 MM MISC, Use with insulin as prescribed  Dx Code: E11.9, Disp: 100 each, Rfl: 2  Lancets MISC, USE UP TO 3 TIMES DAILY AS DIRECTED, Disp: 100 each, Rfl: 2   lidocaine-prilocaine (EMLA) cream, Apply 1 application topically as needed., Disp: 30 g, Rfl: 2   losartan (COZAAR) 50 MG tablet, Take 1 tablet (50 mg total) by mouth daily. For blood pressure., Disp: 90 tablet, Rfl: 3   Melatonin 10 MG CAPS, Take 10 mg by mouth at bedtime., Disp: , Rfl:    metFORMIN (GLUCOPHAGE) 1000 MG tablet, TAKE 1 TABLET (1,000 MG TOTAL) BY MOUTH 2 (TWO) TIMES DAILY WITH A MEAL. FOR DIABETES, Disp: 180 tablet, Rfl: 1   Multiple Vitamin (MULTI-VITAMINS) TABS, Take 1 tablet by mouth daily. , Disp: , Rfl:    Omega-3 Fatty Acids (SALMON OIL PO), Take 1 capsule by mouth daily., Disp: , Rfl:    ONETOUCH ULTRA test strip, USE UP TO 4 TIMES DAILY AS DIRECTED, Disp: 100 strip, Rfl: 5   oxymetazoline (AFRIN) 0.05 % nasal spray, Place 1 spray into both nostrils 2 (two) times daily as needed for congestion., Disp: , Rfl:    pioglitazone (ACTOS) 45 MG tablet, TAKE 1 TABLET (45 MG TOTAL) BY MOUTH DAILY. FOR DIABETES., Disp: 90 tablet, Rfl: 0   pravastatin (PRAVACHOL) 40 MG tablet, TAKE 1 TABLET BY MOUTH EVERY DAY IN THE EVENING FOR CHOLESTEROL, Disp: 90 tablet, Rfl: 1   pseudoephedrine-acetaminophen (TYLENOL SINUS) 30-500 MG TABS tablet, Take 2 tablets by mouth at bedtime., Disp: , Rfl:    testosterone cypionate (DEPOTESTOSTERONE CYPIONATE) 200 MG/ML injection, Inject 200 mg into the muscle every 14 (fourteen) days., Disp: , Rfl:    ipratropium-albuterol (DUONEB) 0.5-2.5 (3) MG/3ML SOLN, Take 3 mLs by nebulization every 4 (four) hours while awake for 3 days, THEN  3 mLs every 4 (four) hours as needed (shortness of breath or wheezing)., Disp: 360 mL, Rfl: 0  I spent 42 minutes dedicated to the care of this patient on the date of this encounter to include pre-visit review of records, face-to-face time with the patient discussing conditions above, post visit ordering of testing, clinical documentation with the electronic health record, making appropriate referrals as documented, and communicating necessary findings to members of the patients care team.    Garner Nash, DO [facility] 01/16/2022 3:07 PM

## 2022-01-16 NOTE — Telephone Encounter (Signed)
Scheduled per providers nurse request, cancelled 02/07 appointment and rescheduled infusion to 01/09. Patient has been notified by the nurse.

## 2022-01-16 NOTE — Telephone Encounter (Signed)
Pt wife called requesting to change infusion appt due to procedure pt has to have. Per Loren,RN, [place] to change infusion to 2/9. Called pt wife to make aware of change of appt's and verbalized understanding.

## 2022-01-16 NOTE — Patient Instructions (Addendum)
Thank you for visiting Dr. Valeta Harms at [facility]. Today we recommend the following:.  Orders Placed This Encounter  Procedures   Procedural/ Surgical Case Request: CHEST TUBE INSERTION   DG Chest 2 View   Stop eliquis, last dose evening of Feb 3rd.   Planned procedure on Monday Feb 6th with Dr. Tamala Julian.   Return in about 4 weeks (around 02/13/2022) for w/ Dr. Valeta Harms .    Please do your part to reduce the spread of COVID-19.

## 2022-01-16 NOTE — Progress Notes (Signed)
Synopsis: Referred in December 2022 for recurrent pleural effusion by Doreene Nest, NP  Subjective:   PATIENT ID: Adam Wise GENDER: male DOB: [birth year], MRN: [id number]  Chief Complaint  Patient presents with   Follow-up    Increased SOB, wheezing since thora done 01/03/22. Neb helps some. He stopped Breztri bc it did not seem to be helping.     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  Patient was completed palliative radiotherapy and systemic chemotherapy followed by Dr. Shirline Frees.  Patient unfortunately has had a recurrent right-sided effusion.  He has had 6 thoracentesis in the past.  Patient was referred today to discuss possibility of Pleurx catheter placement or consideration for other options of recurrent pleural effusion.  He has a longtime history of tobacco use.  Currently using albuterol only.  Was placed on Anoro in the past with no significant improvement.  When you know if there is other inhaler options.  OV 01/16/2022: Patient was seen today for follow-up after recent surgical consultation.  Dr. Cliffton Asters felt as if risk for thoracotomy were too high and concern for BPF if operating on him.  Therefore we are here today to discuss pigtail placement for talc pleurodesis versus a indwelling pleural catheter placement.  Patient would like to proceed with talc pleurodesis.  We talked about the risk benefits of both today in the office.  He does have increasing shortness of breath.  Ultrasound completed also reveals persistent right-sided fluid accumulation today in the office.  He also had a two-view chest x-ray confirming this.  We reviewed this today in the office as well.   Past Medical History:  Diagnosis Date   Chickenpox    Chronic knee pain     Chronic low back pain    Coronary artery calcification seen on CAT scan 11/2021   Coronary CTA 11/27/2021: Moderate to large right pleural effusion and compressive atelectasis right lung base. => Coronary Calcium Score 657.  Diffuse RCA calcification.  Minimal mild disease in the LAD and diagonal branches. == Overall limited study.  Notable artifact.   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 He has disease progression after the first line of chemotherapy in December 2021.   Testosterone deficiency    Type 2 diabetes mellitus (HCC)      Family History  Problem Relation Age of Onset   Heart attack Mother    Cancer Mother    Hypertension Mother    Arthritis Father    Asthma Father    Cancer Father    COPD Father    Heart attack Father    Hypertension Sister    Cancer Sister    Diabetes Sister    Asthma Sister    Cancer Sister    COPD Sister    Arthritis Sister    Asthma Sister    Diabetes Sister    Birth defects Maternal Grandfather    Arthritis Paternal Grandmother    Diabetes Paternal Grandmother  Arthritis Paternal Grandfather    Asthma Son    Other Neg Hx        pituitary disorder     Past Surgical History:  Procedure Laterality Date   COLONOSCOPY WITH PROPOFOL N/A 12/17/2018   Procedure: COLONOSCOPY WITH PROPOFOL;  Surgeon: Virgel Manifold, MD;  Location: [facility] ENDOSCOPY;  Service: Gastroenterology;  Laterality: N/A;   IR IMAGING GUIDED PORT INSERTION  04/17/2020   IR THORACENTESIS ASP PLEURAL SPACE W/IMG GUIDE  11/29/2021   IR THORACENTESIS ASP PLEURAL SPACE W/IMG GUIDE  01/03/2022   JOINT REPLACEMENT Bilateral     REPLACEMENT TOTAL KNEE BILATERAL  2015   TONSILLECTOMY  1960   TRANSTHORACIC ECHOCARDIOGRAM  05/2020   a) 05/2020: EF 55 to 60%.  No R WMA.  Mild LVH.  Indeterminate LVEDP.  Unable to assess RVP.  Normal aortic and mitral valves.  Mildly elevated RAP.; b) 09/2021: EF 50-55%. No RWMA. Mild LVH. ~ LVEDP. Mild LA Dilation. NORMAL RV/RAP.  Normal MV/AoV.    Social History   Socioeconomic History   Marital status: Married    Spouse name: Not on file   Number of children: Not on file   Years of education: Not on file   Highest education level: Not on file  Occupational History   Not on file  Tobacco Use   Smoking status: Former   Smokeless tobacco: Never  Vaping Use   Vaping Use: Never used  Substance and Sexual Activity   Alcohol use: Yes    Comment: rarely   Drug use: Not Currently   Sexual activity: Yes  Other Topics Concern   Not on file  Social History Narrative   Married.   Moved from [place].   Retired.   Social Determinants of Health   Financial Resource Strain: Low Risk    Difficulty of Paying Living Expenses: Not hard at all  Food Insecurity: No Food Insecurity   Worried About Charity fundraiser in the Last Year: Never true   [place] in the Last Year: Never true  Transportation Needs: No Transportation Needs   Lack of Transportation (Medical): No   Lack of Transportation (Non-Medical): No  Physical Activity: Inactive   Days of Exercise per Week: 0 days   Minutes of Exercise per Session: 0 min  Stress: No Stress Concern Present   Feeling of Stress : Not at all  Social Connections: Not on file  Intimate Partner Violence: Not At Risk   Fear of Current or Ex-Partner: No   Emotionally Abused: No   Physically Abused: No   Sexually Abused: No     Allergies  Allergen Reactions   Bupropion     Racing heart   Hydrochlorothiazide Other (See Comments)    Cramping to lower extremities     Outpatient Medications Prior to Visit  Medication Sig Dispense Refill    albuterol (VENTOLIN HFA) 108 (90 Base) MCG/ACT inhaler Inhale 2 puffs into the lungs every 6 (six) hours as needed for wheezing or shortness of breath. 18 g 0   amLODipine (NORVASC) 10 MG tablet TAKE 1 TABLET BY MOUTH EVERY DAY FOR BLOOD PRESSURE 90 tablet 3   B-D 3CC LUER-LOK SYR 22GX1" 22G X 1" 3 ML MISC USE AS INSTRUCTED FOR TESTOSTERONE INJECTION EVERY 2 WEEKS 10 each 2   bisoprolol (ZEBETA) 5 MG tablet Take 1 tablet (5 mg total) by mouth daily. 30 tablet 6   Black Pepper-Turmeric (TURMERIC COMPLEX/BLACK PEPPER PO) Take 1 tablet by mouth  in the morning and at bedtime.     blood glucose meter kit and supplies KIT Dispense based on patient and insurance preference. Use up to four times daily as directed. 1 each 0   chlorpheniramine-HYDROcodone (TUSSIONEX) 10-8 MG/5ML SUER Take 5 mLs by mouth at bedtime as needed for cough. 473 mL 0   ELIQUIS 5 MG TABS tablet TAKE 1 TABLET BY MOUTH TWICE A DAY 180 tablet 2   esomeprazole (NEXIUM) 20 MG capsule Take 20 mg by mouth daily.      gabapentin (NEURONTIN) 300 MG capsule Take 300 mg by mouth at bedtime.     glipiZIDE (GLUCOTROL) 10 MG tablet TAKE 1 TABLET (10 MG TOTAL) BY MOUTH 2 (TWO) TIMES DAILY BEFORE A MEAL. FOR DIABETES. 180 tablet 3   Insulin Glargine (BASAGLAR KWIKPEN) 100 UNIT/ML Inject 20 Units into the skin at bedtime. For diabetes. 30 mL 1   Insulin Pen Needle (BD PEN NEEDLE NANO U/F) 32G X 4 MM MISC Use with insulin as prescribed  Dx Code: E11.9 100 each 2   Lancets MISC USE UP TO 3 TIMES DAILY AS DIRECTED 100 each 2   lidocaine-prilocaine (EMLA) cream Apply 1 application topically as needed. 30 g 2   losartan (COZAAR) 50 MG tablet Take 1 tablet (50 mg total) by mouth daily. For blood pressure. 90 tablet 3   Melatonin 10 MG CAPS Take 10 mg by mouth at bedtime.     metFORMIN (GLUCOPHAGE) 1000 MG tablet TAKE 1 TABLET (1,000 MG TOTAL) BY MOUTH 2 (TWO) TIMES DAILY WITH A MEAL. FOR DIABETES 180 tablet 1   Multiple Vitamin (MULTI-VITAMINS) TABS  Take 1 tablet by mouth daily.      Omega-3 Fatty Acids (SALMON OIL PO) Take 1 capsule by mouth daily.     ONETOUCH ULTRA test strip USE UP TO 4 TIMES DAILY AS DIRECTED 100 strip 5   oxymetazoline (AFRIN) 0.05 % nasal spray Place 1 spray into both nostrils 2 (two) times daily as needed for congestion.     pioglitazone (ACTOS) 45 MG tablet TAKE 1 TABLET (45 MG TOTAL) BY MOUTH DAILY. FOR DIABETES. 90 tablet 0   pravastatin (PRAVACHOL) 40 MG tablet TAKE 1 TABLET BY MOUTH EVERY DAY IN THE EVENING FOR CHOLESTEROL 90 tablet 1   pseudoephedrine-acetaminophen (TYLENOL SINUS) 30-500 MG TABS tablet Take 2 tablets by mouth at bedtime.     testosterone cypionate (DEPOTESTOSTERONE CYPIONATE) 200 MG/ML injection Inject 200 mg into the muscle every 14 (fourteen) days.     ipratropium-albuterol (DUONEB) 0.5-2.5 (3) MG/3ML SOLN Take 3 mLs by nebulization every 4 (four) hours while awake for 3 days, THEN 3 mLs every 4 (four) hours as needed (shortness of breath or wheezing). 360 mL 0   Budeson-Glycopyrrol-Formoterol (BREZTRI AEROSPHERE) 160-9-4.8 MCG/ACT AERO Inhale 2 puffs into the lungs in the morning and at bedtime. 10.7 g 0   No facility-administered medications prior to visit.    Review of Systems  Constitutional:  Negative for chills, fever, malaise/fatigue and weight loss.  HENT:  Negative for hearing loss, sore throat and tinnitus.   Eyes:  Negative for blurred vision and double vision.  Respiratory:  Positive for shortness of breath. Negative for cough, hemoptysis, sputum production, wheezing and stridor.   Cardiovascular:  Negative for chest pain, palpitations, orthopnea, leg swelling and PND.  Gastrointestinal:  Negative for abdominal pain, constipation, diarrhea, heartburn, nausea and vomiting.  Genitourinary:  Negative for dysuria, hematuria and urgency.  Musculoskeletal:  Negative for joint pain and myalgias.  Skin:  Negative for itching and rash.  Neurological:  Negative for dizziness, tingling,  weakness and headaches.  Endo/Heme/Allergies:  Negative for environmental allergies. Does not bruise/bleed easily.  Psychiatric/Behavioral:  Negative for depression. The patient is not nervous/anxious and does not have insomnia.   All other systems reviewed and are negative.   Objective:  Physical Exam Vitals reviewed.  Constitutional:      General: He is not in acute distress.    Appearance: He is well-developed. He is obese.  HENT:     Head: Normocephalic and atraumatic.  Eyes:     General: No scleral icterus.    Conjunctiva/sclera: Conjunctivae normal.     Pupils: Pupils are equal, round, and reactive to light.  Neck:     Vascular: No JVD.     Trachea: No tracheal deviation.  Cardiovascular:     Rate and Rhythm: Normal rate and regular rhythm.     Heart sounds: Normal heart sounds. No murmur heard. Pulmonary:     Effort: Pulmonary effort is normal. No tachypnea, accessory muscle usage or respiratory distress.     Breath sounds: No stridor. No wheezing, rhonchi or rales.     Comments: Absent breath sounds in the right base Abdominal:     General: There is no distension.     Palpations: Abdomen is soft.     Tenderness: There is no abdominal tenderness.  Musculoskeletal:        General: No tenderness.     Cervical back: Neck supple.  Lymphadenopathy:     Cervical: No cervical adenopathy.  Skin:    General: Skin is warm and dry.     Capillary Refill: Capillary refill takes less than 2 seconds.     Findings: No rash.  Neurological:     Mental Status: He is alert and oriented to person, place, and time.  Psychiatric:        Behavior: Behavior normal.     Vitals:   01/16/22 1348  BP: 128/78  Pulse: 100  Temp: 99.5 F (37.5 C)  TempSrc: Oral  SpO2: 97%  Weight: 294 lb 9.6 oz (133.6 kg)  Height: $Remove'5\' 9"'RhCOLai$  (1.753 m)   97% on RA BMI Readings from Last 3 Encounters:  01/16/22 43.50 kg/m  12/31/21 42.94 kg/m  12/20/21 43.27 kg/m   Wt Readings from Last 3  Encounters:  01/16/22 294 lb 9.6 oz (133.6 kg)  12/31/21 290 lb 12.8 oz (131.9 kg)  12/20/21 293 lb (132.9 kg)     CBC    Component Value Date/Time   WBC 3.0 (L) 12/31/2021 1045   WBC 2.5 (L) 10/18/2021 1533   RBC 3.90 (L) 12/31/2021 1045   HGB 11.5 (L) 12/31/2021 1045   HCT 35.4 (L) 12/31/2021 1045   HCT 42.7 10/06/2017 1052   PLT 226 12/31/2021 1045   MCV 90.8 12/31/2021 1045   MCH 29.5 12/31/2021 1045   MCHC 32.5 12/31/2021 1045   RDW 15.1 12/31/2021 1045   LYMPHSABS 0.6 (L) 12/31/2021 1045   MONOABS 0.6 12/31/2021 1045   EOSABS 0.1 12/31/2021 1045   BASOSABS 0.0 12/31/2021 1045     Chest Imaging: Chest x-ray 11/29/2021: No significant effusion on the right following a thoracentesis, no pneumothorax.  Some scarring in the right base, density in the medial portion of the right upper lobe history of small cell with radiation. The patient's images have been independently reviewed by me.    Pulmonary Functions Testing Results: No flowsheet data found.  FeNO:   Pathology:  Echocardiogram:   Heart Catheterization:     Assessment & Plan:     ICD-10-CM   1. Recurrent pleural effusion  J90 DG Chest 2 View    Procedural/ Surgical Case Request: CHEST TUBE INSERTION    Admit to Inpatient (patient's expected length of stay will be greater than 2 midnights or inpatient only procedure)    2. Recurrent pleural effusion on right  J90     3. Small cell lung cancer, right upper lobe (HCC)  C34.11     4. Chronic obstructive pulmonary disease, unspecified COPD type ([place])  J44.9       Discussion: This is a 71 year old gentleman, COPD, stage IV small cell carcinoma, with a recurrent right-sided pleural effusion has been tapped 6 times.  We talked about the possibility of Pleurx catheter placement versus consideration for talc pleurodesis.  We also had him see thoracic surgery for consideration of thoracotomy.  He has been declined for surgery.  Plan: We talked about the  risk benefits of doing indwelling pleural catheter versus talc pleurodesis. Patient has chosen to undergo talc pleurodesis. We will plan this to be completed on Monday. We will request a bed at the hospital just in case he needs to be admitted following pigtail placement and endoscopy. Dr. Tamala Julian will do this procedure for [place] next week. I will help coordinate with him. Patient still patient to hold his Eliquis starting Saturday of this week. Procedure to be completed likely Monday afternoon.  Today in the office we arranged for admission to hospital.  Planning of procedure.  We appreciate staff's help in coordination of care.   Current Outpatient Medications:    albuterol (VENTOLIN HFA) 108 (90 Base) MCG/ACT inhaler, Inhale 2 puffs into the lungs every 6 (six) hours as needed for wheezing or shortness of breath., Disp: 18 g, Rfl: 0   amLODipine (NORVASC) 10 MG tablet, TAKE 1 TABLET BY MOUTH EVERY DAY FOR BLOOD PRESSURE, Disp: 90 tablet, Rfl: 3   B-D 3CC LUER-LOK SYR 22GX1" 22G X 1" 3 ML MISC, USE AS INSTRUCTED FOR TESTOSTERONE INJECTION EVERY 2 WEEKS, Disp: 10 each, Rfl: 2   bisoprolol (ZEBETA) 5 MG tablet, Take 1 tablet (5 mg total) by mouth daily., Disp: 30 tablet, Rfl: 6   Black Pepper-Turmeric (TURMERIC COMPLEX/BLACK PEPPER PO), Take 1 tablet by mouth in the morning and at bedtime., Disp: , Rfl:    blood glucose meter kit and supplies KIT, Dispense based on patient and insurance preference. Use up to four times daily as directed., Disp: 1 each, Rfl: 0   chlorpheniramine-HYDROcodone (TUSSIONEX) 10-8 MG/5ML SUER, Take 5 mLs by mouth at bedtime as needed for cough., Disp: 473 mL, Rfl: 0   ELIQUIS 5 MG TABS tablet, TAKE 1 TABLET BY MOUTH TWICE A DAY, Disp: 180 tablet, Rfl: 2   esomeprazole (NEXIUM) 20 MG capsule, Take 20 mg by mouth daily. , Disp: , Rfl:    gabapentin (NEURONTIN) 300 MG capsule, Take 300 mg by mouth at bedtime., Disp: , Rfl:    glipiZIDE (GLUCOTROL) 10 MG tablet, TAKE 1 TABLET  (10 MG TOTAL) BY MOUTH 2 (TWO) TIMES DAILY BEFORE A MEAL. FOR DIABETES., Disp: 180 tablet, Rfl: 3   Insulin Glargine (BASAGLAR KWIKPEN) 100 UNIT/ML, Inject 20 Units into the skin at bedtime. For diabetes., Disp: 30 mL, Rfl: 1   Insulin Pen Needle (BD PEN NEEDLE NANO U/F) 32G X 4 MM MISC, Use with insulin as prescribed  Dx Code: E11.9, Disp: 100 each, Rfl: 2  Lancets MISC, USE UP TO 3 TIMES DAILY AS DIRECTED, Disp: 100 each, Rfl: 2   lidocaine-prilocaine (EMLA) cream, Apply 1 application topically as needed., Disp: 30 g, Rfl: 2   losartan (COZAAR) 50 MG tablet, Take 1 tablet (50 mg total) by mouth daily. For blood pressure., Disp: 90 tablet, Rfl: 3   Melatonin 10 MG CAPS, Take 10 mg by mouth at bedtime., Disp: , Rfl:    metFORMIN (GLUCOPHAGE) 1000 MG tablet, TAKE 1 TABLET (1,000 MG TOTAL) BY MOUTH 2 (TWO) TIMES DAILY WITH A MEAL. FOR DIABETES, Disp: 180 tablet, Rfl: 1   Multiple Vitamin (MULTI-VITAMINS) TABS, Take 1 tablet by mouth daily. , Disp: , Rfl:    Omega-3 Fatty Acids (SALMON OIL PO), Take 1 capsule by mouth daily., Disp: , Rfl:    ONETOUCH ULTRA test strip, USE UP TO 4 TIMES DAILY AS DIRECTED, Disp: 100 strip, Rfl: 5   oxymetazoline (AFRIN) 0.05 % nasal spray, Place 1 spray into both nostrils 2 (two) times daily as needed for congestion., Disp: , Rfl:    pioglitazone (ACTOS) 45 MG tablet, TAKE 1 TABLET (45 MG TOTAL) BY MOUTH DAILY. FOR DIABETES., Disp: 90 tablet, Rfl: 0   pravastatin (PRAVACHOL) 40 MG tablet, TAKE 1 TABLET BY MOUTH EVERY DAY IN THE EVENING FOR CHOLESTEROL, Disp: 90 tablet, Rfl: 1   pseudoephedrine-acetaminophen (TYLENOL SINUS) 30-500 MG TABS tablet, Take 2 tablets by mouth at bedtime., Disp: , Rfl:    testosterone cypionate (DEPOTESTOSTERONE CYPIONATE) 200 MG/ML injection, Inject 200 mg into the muscle every 14 (fourteen) days., Disp: , Rfl:    ipratropium-albuterol (DUONEB) 0.5-2.5 (3) MG/3ML SOLN, Take 3 mLs by nebulization every 4 (four) hours while awake for 3 days, THEN  3 mLs every 4 (four) hours as needed (shortness of breath or wheezing)., Disp: 360 mL, Rfl: 0  I spent 42 minutes dedicated to the care of this patient on the date of this encounter to include pre-visit review of records, face-to-face time with the patient discussing conditions above, post visit ordering of testing, clinical documentation with the electronic health record, making appropriate referrals as documented, and communicating necessary findings to members of the patients care team.    Garner Nash, DO [facility] 01/16/2022 3:07 PM

## 2022-01-17 ENCOUNTER — Telehealth: Payer: Self-pay | Admitting: Pulmonary Disease

## 2022-01-17 ENCOUNTER — Encounter: Payer: Self-pay | Admitting: Pulmonary Disease

## 2022-01-17 NOTE — Telephone Encounter (Signed)
Called and spoke to patient and he had a question about what time his procedure was on 01/20/22 and I looked it up and his procedure is 01/20/22 at 1330 from chest tube insertion. No questions noted from patient. Nothing further needed

## 2022-01-20 ENCOUNTER — Other Ambulatory Visit: Payer: Self-pay

## 2022-01-20 ENCOUNTER — Ambulatory Visit (HOSPITAL_COMMUNITY)
Admission: AD | Admit: 2022-01-20 | Discharge: 2022-01-20 | Disposition: A | Discharge status: Home / Self Care | Payer: Medicare Other | Source: Ambulatory Visit | Attending: Internal Medicine | Admitting: Internal Medicine

## 2022-01-20 ENCOUNTER — Encounter (HOSPITAL_COMMUNITY)
Admission: AD | Disposition: A | Discharge status: Home / Self Care | Payer: Self-pay | Source: Ambulatory Visit | Attending: Internal Medicine

## 2022-01-20 DIAGNOSIS — G4733 Obstructive sleep apnea (adult) (pediatric): Secondary | ICD-10-CM | POA: Insufficient documentation

## 2022-01-20 DIAGNOSIS — Z9221 Personal history of antineoplastic chemotherapy: Secondary | ICD-10-CM | POA: Insufficient documentation

## 2022-01-20 DIAGNOSIS — J449 Chronic obstructive pulmonary disease, unspecified: Secondary | ICD-10-CM | POA: Diagnosis not present

## 2022-01-20 DIAGNOSIS — Z79899 Other long term (current) drug therapy: Secondary | ICD-10-CM | POA: Diagnosis not present

## 2022-01-20 DIAGNOSIS — J90 Pleural effusion, not elsewhere classified: Secondary | ICD-10-CM

## 2022-01-20 DIAGNOSIS — Z87891 Personal history of nicotine dependence: Secondary | ICD-10-CM | POA: Diagnosis not present

## 2022-01-20 DIAGNOSIS — C3411 Malignant neoplasm of upper lobe, right bronchus or lung: Secondary | ICD-10-CM | POA: Insufficient documentation

## 2022-01-20 DIAGNOSIS — J910 Malignant pleural effusion: Secondary | ICD-10-CM | POA: Diagnosis not present

## 2022-01-20 DIAGNOSIS — I2692 Saddle embolus of pulmonary artery without acute cor pulmonale: Secondary | ICD-10-CM

## 2022-01-20 HISTORY — PX: CHEST TUBE INSERTION: SHX231

## 2022-01-20 SURGERY — CHEST TUBE INSERTION
Anesthesia: Topical | Laterality: Right

## 2022-01-20 MED ORDER — SODIUM CHLORIDE 0.9% FLUSH: 10.0000 mL | Freq: Three times a day (TID) | INTRAVENOUS | Status: DC

## 2022-01-20 MED ORDER — HYDROMORPHONE HCL 1 MG/ML IJ SOLN
INTRAMUSCULAR | Status: AC
  Filled 2022-01-20: qty 2

## 2022-01-20 MED ORDER — TALC (STERITALC) POWDER FOR INTRAPLEURAL USE
4.0000 g | Freq: Once | INTRAVENOUS | Status: AC
  Administered 2022-01-20: 4 g via INTRAPLEURAL
  Filled 2022-01-20: qty 4

## 2022-01-20 MED ORDER — LIDOCAINE HCL 1 % IJ SOLN
25.0000 mL | Freq: Once | INTRAMUSCULAR | Status: AC
  Administered 2022-01-20: 25 mL via INTRAPLEURAL
  Filled 2022-01-20: qty 25

## 2022-01-20 MED ORDER — HYDROMORPHONE HCL 1 MG/ML IJ SOLN
INTRAMUSCULAR | Status: AC
  Filled 2022-01-20: qty 1

## 2022-01-20 MED ORDER — APIXABAN 5 MG PO TABS: 5.0000 mg | ORAL_TABLET | Freq: Two times a day (BID) | ORAL | 2 refills | Status: DC

## 2022-01-20 MED ORDER — OXYCODONE-ACETAMINOPHEN 5-325 MG PO TABS: 1.0000 | ORAL_TABLET | ORAL | 0 refills | Status: DC | PRN

## 2022-01-20 MED ORDER — LIDOCAINE HCL 1 % IJ SOLN: 25.0000 mL | Freq: Once | INTRAMUSCULAR | Status: DC

## 2022-01-20 MED ORDER — TALC (STERITALC) POWDER FOR INTRAPLEURAL USE: 4.0000 g | Freq: Once | INTRAVENOUS | Status: DC

## 2022-01-20 MED ORDER — HYDROMORPHONE HCL 1 MG/ML IJ SOLN
2.0000 mg | INTRAMUSCULAR | Status: AC | PRN
  Administered 2022-01-20 (×3): 0.5 mg via INTRAVENOUS
  Administered 2022-01-20: 1.5 mg via INTRAVENOUS

## 2022-01-20 NOTE — Interval H&P Note (Signed)
History and Physical Interval Note:  01/20/2022 1:11 PM  Adam Wise  has presented today for surgery, with the diagnosis of pleural effusion.  The various methods of treatment have been discussed with the patient and family. After consideration of risks, benefits and other options for treatment, the patient has consented to  Procedure(s) with comments: CHEST TUBE INSERTION (Right) - w/ Talc Pleurodesis, planned admit for Obs afterwards as a surgical intervention.  The patient's history has been reviewed, patient examined, no change in status, stable for surgery.  I have reviewed the patient's chart and labs.  Questions were answered to the patient's satisfaction.     Candee Furbish

## 2022-01-20 NOTE — Discharge Summary (Signed)
Physician Discharge Summary         Patient ID: Adam Wise MRN: [id number] DOB/AGE: March 03, 1951 71 y.o.  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 Valeta Harms in the outpatient setting.  Presents to the hospital today for outpatient placement of thoracostomy tube, and talc pleurodesis for recurrent pleural effusion. He was brought to the endoscopy suite, a right-sided pigtail chest tube was inserted without difficulty using ultrasound guidance.  Following that talc slurry was administered into the pleural space.  The patient chest tube was then clamped, and talc dwell time of 1 hour achieved.  Following that the chest tube was removed.  Patient tolerated well, did require as needed narcotics for pain.  He is now ready for discharge, he will follow-up with Dr. Valeta Harms.  He understands he may still require Pleurx should talc pleurodesis be unsuccessful.    Discharge Exam: Blood Pressure (Abnormal) 160/76    Pulse 93    Temperature (Abnormal) 97.3 F (36.3 C) (Temporal)    Respiration 17    Height _0  (1.753 m)    Weight 133.6 kg    Oxygen Saturation 93%    Body Mass Index 43.50 kg/m   General: 71 year old white male resting in bed no acute distress postprocedure HEENT no cephalic atraumatic no jugular venous distention Pulmonary clear to auscultation Cardiac regular rate and rhythm Abdomen soft nontender Neuro intact   Labs at discharge   Lab Results  Component Value Date   CREATININE 1.21 12/31/2021   BUN 16 12/31/2021   NA 141 12/31/2021   K 4.0 12/31/2021   CL 104 12/31/2021   CO2 31 12/31/2021   Lab Results  Component Value Date   WBC 3.0 (L) 12/31/2021   HGB 11.5 (L) 12/31/2021   HCT 35.4 (L) 12/31/2021    MCV 90.8 12/31/2021   PLT 226 12/31/2021   Lab Results  Component Value Date   ALT 16 12/31/2021   AST 18 12/31/2021   ALKPHOS 54 12/31/2021   BILITOT 0.3 12/31/2021   Lab Results  Component Value Date   INR 1.0 10/11/2021   INR 1.0 05/15/2020    Current radiological studies    No results found.  Disposition:    Discharge disposition: 01-Home or Self Care       Discharge Instructions     Call MD for:  redness, tenderness, or signs of infection (pain, swelling, redness, odor or green/yellow discharge around incision site)   Complete by: As directed    Call MD for:  severe uncontrolled pain   Complete by: As directed    Call MD for:  temperature >100.4   Complete by: As directed    Diet - low sodium heart healthy   Complete by: As directed    Discharge instructions   Complete by: As directed    May remove dressing in am  Resume blood thinner in am  OK to shower 2/7   Increase activity slowly   Complete by: As directed    Remove dressing in 24 hours   Complete by: As directed        Allergies as of 01/20/2022     Allergen Reactions Comments   Bupropion  Racing heart   Hydrochlorothiazide Other (See Comments) Cramping to lower  extremities        Medication List     Take these medications    albuterol 108 (90 Base) MCG/ACT inhaler Commonly known as: VENTOLIN HFA Inhale 2 puffs into the lungs every 6 (six) hours as needed for wheezing or shortness of breath.   amLODipine 10 MG tablet Commonly known as: NORVASC TAKE 1 TABLET BY MOUTH EVERY DAY FOR BLOOD PRESSURE   apixaban 5 MG Tabs tablet Commonly known as: Eliquis Take 1 tablet (5 mg total) by mouth 2 (two) times daily. Start taking on: January 21, 2022 What changed: how much to take   B-D 3CC LUER-LOK SYR 22GX1" 22G X 1" 3 ML Misc Generic drug: SYRINGE-NEEDLE (DISP) 3 ML USE AS INSTRUCTED FOR TESTOSTERONE INJECTION EVERY 2 WEEKS   Basaglar KwikPen 100 UNIT/ML Inject 20 Units into the skin  at bedtime. For diabetes.   BD Pen Needle Nano U/F 32G X 4 MM Misc Generic drug: Insulin Pen Needle Use with insulin as prescribed  Dx Code: E11.9   bisoprolol 5 MG tablet Commonly known as: ZEBETA Take 1 tablet (5 mg total) by mouth daily.   blood glucose meter kit and supplies Kit Dispense based on patient and insurance preference. Use up to four times daily as directed.   chlorpheniramine-HYDROcodone 10-8 MG/5ML Suer Commonly known as: TUSSIONEX Take 5 mLs by mouth at bedtime as needed for cough.   esomeprazole 20 MG capsule Commonly known as: NEXIUM Take 20 mg by mouth in the morning.   gabapentin 300 MG capsule Commonly known as: NEURONTIN Take 300 mg by mouth at bedtime.   glipiZIDE 10 MG tablet Commonly known as: GLUCOTROL TAKE 1 TABLET (10 MG TOTAL) BY MOUTH 2 (TWO) TIMES DAILY BEFORE A MEAL. FOR DIABETES.   ipratropium-albuterol 0.5-2.5 (3) MG/3ML Soln Commonly known as: DUONEB Take 3 mLs by nebulization every 4 (four) hours while awake for 3 days, THEN 3 mLs every 4 (four) hours as needed (shortness of breath or wheezing). Start taking on: October 13, 2021   Lancets Misc USE UP TO 3 TIMES DAILY AS DIRECTED   losartan 50 MG tablet Commonly known as: COZAAR Take 1 tablet (50 mg total) by mouth daily. For blood pressure.   Melatonin 10 MG Caps Take 10 mg by mouth at bedtime.   metFORMIN 1000 MG tablet Commonly known as: GLUCOPHAGE TAKE 1 TABLET (1,000 MG TOTAL) BY MOUTH 2 (TWO) TIMES DAILY WITH A MEAL. FOR DIABETES   Multi-Vitamins Tabs Take 1 tablet by mouth in the morning.   OneTouch Ultra test strip Generic drug: glucose blood USE UP TO 4 TIMES DAILY AS DIRECTED   oxyCODONE-acetaminophen 5-325 MG tablet Commonly known as: Percocet Take 1-2 tablets by mouth every 4 (four) hours as needed for severe pain.   oxymetazoline 0.05 % nasal spray Commonly known as: AFRIN Place 1 spray into both nostrils daily.   pioglitazone 45 MG tablet Commonly  known as: ACTOS TAKE 1 TABLET (45 MG TOTAL) BY MOUTH DAILY. FOR DIABETES.   pravastatin 40 MG tablet Commonly known as: PRAVACHOL TAKE 1 TABLET BY MOUTH EVERY DAY IN THE EVENING FOR CHOLESTEROL   SALMON OIL PO Take 1 capsule by mouth daily.   testosterone cypionate 200 MG/ML injection Commonly known as: DEPOTESTOSTERONE CYPIONATE Inject 200 mg into the muscle every 14 (fourteen) days.   TURMERIC COMPLEX/BLACK PEPPER PO Take 1 tablet by mouth in the morning and at bedtime.         Follow-up appointment   With [facility]  Discharge Condition:    good  Physician Statement:   The Patient was personally examined, the discharge assessment and plan has been personally reviewed and I agree with ACNP Dae Highley's assessment and plan. 32  minutes of time have been dedicated to discharge assessment, planning and discharge instructions.   Signed: Clementeen Graham 01/20/2022, 3:24 PM

## 2022-01-20 NOTE — Procedures (Signed)
Chest Tube Pleurodesis Note  Adam Wise  [id number]  [birth year]  Date:01/20/22  Time:2:08 PM   Provider Performing:Dustin Bumbaugh C Tamala Julian   Procedure: Chemical Pleurodesis via Chest Tube (32560)  Indication(s) Induced scarring of pleural space  Consent Risks of the procedure as well as the alternatives and risks of each were explained to the patient and/or caregiver.  Consent for the procedure was obtained.   Anesthesia 20cc 1% Lidocaine injected in with pleurodesis agent  Time Out Verified patient identification, verified procedure, site/side was marked, verified correct patient position, special equipment/implants available, medications/allergies/relevant history reviewed, required imaging and test results available.   Sterile Technique Hand hygiene, gloves   Procedure Description Existing pleural catheter was cleaned and accessed in sterile manner.  Talc 4g in 50cc saline slurry administered into pleural space.  The chest tube will be clamped and patient will rotate positions for 1 hour then chest tube will be placed to suction.   Complications/Tolerance None; patient tolerated the procedure well.   EBL None   Specimen(s) None

## 2022-01-20 NOTE — Procedures (Signed)
Insertion of Chest Tube Procedure Note  Adam Wise  [id number]  July 04, 1951  Date:01/20/22  Time:2:40 PM    Provider Performing: Clementeen Graham   Procedure: Chest Tube Insertion [id number])  Indication(s) Effusion  Consent Risks of the procedure as well as the alternatives and risks of each were explained to the patient and/or caregiver.  Consent for the procedure was obtained and is signed in the bedside chart  Anesthesia Topical only with 1% lidocaine    Time Out Verified patient identification, verified procedure, site/side was marked, verified correct patient position, special equipment/implants available, medications/allergies/relevant history reviewed, required imaging and test results available.   Sterile Technique Maximal sterile technique including full sterile barrier drape, hand hygiene, sterile gown, sterile gloves, mask, hair covering, sterile ultrasound probe cover (if used).   Procedure Description Ultrasound used to identify appropriate pleural anatomy for placement and overlying skin marked. Area of placement cleaned and draped in sterile fashion.  A 14 French pigtail pleural catheter was placed into the right pleural space using Seldinger technique. Appropriate return of fluid was obtained.  The tube was connected to atrium and placed on -20 cm H2O wall suction.   Complications/Tolerance None; patient tolerated the procedure well. Chest X-ray is ordered to verify placement.   EBL Minimal  Specimen(s) none  Erick Colace ACNP-BC [place] Pager # [phone] OR # [phone] if no answer

## 2022-01-21 ENCOUNTER — Inpatient Hospital Stay: Payer: Medicare Other

## 2022-01-21 ENCOUNTER — Inpatient Hospital Stay: Payer: Medicare Other | Admitting: Physician Assistant

## 2022-01-22 NOTE — Progress Notes (Signed)
[facility] OFFICE PROGRESS NOTE  [name], NP [place] [address]  DIAGNOSIS: Relapsed extensive stage (T4, N3, M1c)  small cell lung cancer diagnosed in April 2021 and presented with large right upper lobe/right hilar mass with ipsilateral and contralateral mediastinal and right supraclavicular lymphadenopathy in addition to multiple liver lesions. He has disease progression after the first line of chemotherapy in December 2021.  PRIOR THERAPY: 1) Palliative radiotherapy to the right upper lobe lung mass under the care of Dr. Lisbeth Renshaw. 2) Systemic chemotherapy with carboplatin for AUC of 5 on day 1, etoposide 100 mg/M2 on days 1, 2 and 3 in addition to Imfinzi 1500 mg IV every 3 weeks with chemotherapy then every 4 weeks for maintenance if the patient has no evidence for progression.  He will also receive Cosela 240 mg/m2 on the days of the chemotherapy.  Status post 9 cycles.  Starting from cycle #5 the patient will be on maintenance treatment with immunotherapy with Imfinzi 1500 mg IV every 4 weeks. Last dose of chemotherapy was given on November 13, 2020. This treatment was discontinued secondary to disease progression 3) Systemic chemotherapy with carboplatin for AUC of 5 on day 1, etoposide 100 mg/M2 on days 1, 2 and 3, Tecentriq 1200 mg IV every 3 weeks as well as Cosela 250 mg/M2 on the days of the chemotherapy every 3 weeks.  First dose December 18, 2020.  Status post 8 cycles.  CURRENT THERAPY: Zepzelca 3.2 mgm2 IV every 3 weeks. First dose expected on 06/05/21. Status post 11 cycles  INTERVAL HISTORY: Adam Wise 71 y.o. male returns to the clinic today for a follow-up visit accompanied by his wife.  The patient is feeling fairly well today without any concerning complaints.   The patient has been seen pulmonology and cardiothoracic surgery regarding his options for his recurrent pleural effusions.  When he gets recurrent effusions, he notices cough,  chest heaviness, and shortness of breath.  Dr. Kipp Brood felt as if risk for thoracotomy were too high and concern for BPF if operating on him.  Therefore, he was seen for follow up to discuss pigtail placement for talc pleurodesis versus a indwelling pleural catheter placement.  Patient opted to proceed with talc pleurodesis.    The patient underwent outpatient placement of a thoracostomy tube and talc pleurodesis for recurrent pleural effusion on 01/20/2022.  He tolerated this well and was very pleased with his care during the procedure. He notes he still has some dyspneaa on exertion but overall his breathing has significantly improved. He states he feels like he can take a deep breath for the first time in a long time. They also noted he is reportedly wheezing less.   He denies any recent fever or chills.  His weight is stable.  He is compliant with his blood thinner for his history of pulmonary embolism.  Denies any chest pain or hemoptysis.  Denies any nausea, vomiting, diarrhea, or constipation.  Denies any headache or visual changes.  He reports some visual changes secondary to be overdue for his annual eye exam for his diabetes.  The patient is here today for evaluation and repeat blood work before starting cycle #12.      MEDICAL HISTORY: Past Medical History:  Diagnosis Date   Chickenpox    Chronic knee pain    Chronic low back pain    Coronary artery calcification seen on CAT scan 11/2021   Coronary CTA 11/27/2021: Moderate to large  right pleural effusion and compressive atelectasis right lung base. => Coronary Calcium Score 657.  Diffuse RCA calcification.  Minimal mild disease in the Wise and diagonal branches. == Overall limited study.  Notable artifact.   Essential hypertension    GERD (gastroesophageal reflux disease)    Hyperlipidemia    OSA (obstructive sleep apnea)    With nighttime oxygen supplementation   T4, M3, M1 C Metastatic Small Cell Lung Cancer 03/2020   large right  upper lobe/right hilar mass with ipsilateral and contralateral mediastinal and right supraclavicular lymphadenopathy in addition to multiple liver lesios. He has disease progression after the first line of chemotherapy in December 2021.   Testosterone deficiency    Type 2 diabetes mellitus (HCC)     ALLERGIES:  is allergic to bupropion and hydrochlorothiazide.  MEDICATIONS:  Current Outpatient Medications  Medication Sig Dispense Refill   albuterol (VENTOLIN HFA) 108 (90 Base) MCG/ACT inhaler Inhale 2 puffs into the lungs every 6 (six) hours as needed for wheezing or shortness of breath. 18 g 0   amLODipine (NORVASC) 10 MG tablet TAKE 1 TABLET BY MOUTH EVERY DAY FOR BLOOD PRESSURE 90 tablet 3   apixaban (ELIQUIS) 5 MG TABS tablet Take 1 tablet (5 mg total) by mouth 2 (two) times daily. 180 tablet 2   B-D 3CC LUER-LOK SYR 22GX1" 22G X 1" 3 ML MISC USE AS INSTRUCTED FOR TESTOSTERONE INJECTION EVERY 2 WEEKS 10 each 2   bisoprolol (ZEBETA) 5 MG tablet Take 1 tablet (5 mg total) by mouth daily. 30 tablet 6   Black Pepper-Turmeric (TURMERIC COMPLEX/BLACK PEPPER PO) Take 1 tablet by mouth in the morning and at bedtime.     blood glucose meter kit and supplies KIT Dispense based on patient and insurance preference. Use up to four times daily as directed. 1 each 0   chlorpheniramine-HYDROcodone (TUSSIONEX) 10-8 MG/5ML SUER Take 5 mLs by mouth at bedtime as needed for cough. 473 mL 0   esomeprazole (NEXIUM) 20 MG capsule Take 20 mg by mouth in the morning.     gabapentin (NEURONTIN) 300 MG capsule Take 300 mg by mouth at bedtime.     glipiZIDE (GLUCOTROL) 10 MG tablet TAKE 1 TABLET (10 MG TOTAL) BY MOUTH 2 (TWO) TIMES DAILY BEFORE A MEAL. FOR DIABETES. 180 tablet 3   Insulin Glargine (BASAGLAR KWIKPEN) 100 UNIT/ML Inject 20 Units into the skin at bedtime. For diabetes. 30 mL 1   Insulin Pen Needle (BD PEN NEEDLE NANO U/F) 32G X 4 MM MISC Use with insulin as prescribed  Dx Code: E11.9 100 each 2    ipratropium-albuterol (DUONEB) 0.5-2.5 (3) MG/3ML SOLN Take 3 mLs by nebulization every 4 (four) hours while awake for 3 days, THEN 3 mLs every 4 (four) hours as needed (shortness of breath or wheezing). 360 mL 0   Lancets MISC USE UP TO 3 TIMES DAILY AS DIRECTED 100 each 2   losartan (COZAAR) 50 MG tablet Take 1 tablet (50 mg total) by mouth daily. For blood pressure. 90 tablet 3   Melatonin 10 MG CAPS Take 10 mg by mouth at bedtime.     metFORMIN (GLUCOPHAGE) 1000 MG tablet TAKE 1 TABLET (1,000 MG TOTAL) BY MOUTH 2 (TWO) TIMES DAILY WITH A MEAL. FOR DIABETES 180 tablet 1   Multiple Vitamin (MULTI-VITAMINS) TABS Take 1 tablet by mouth in the morning.     Omega-3 Fatty Acids (SALMON OIL PO) Take 1 capsule by mouth daily.     ONETOUCH ULTRA test strip  USE UP TO 4 TIMES DAILY AS DIRECTED 100 strip 5   oxyCODONE-acetaminophen (PERCOCET) 5-325 MG tablet Take 1-2 tablets by mouth every 4 (four) hours as needed for severe pain. 20 tablet 0   oxymetazoline (AFRIN) 0.05 % nasal spray Place 1 spray into both nostrils daily.     pioglitazone (ACTOS) 45 MG tablet TAKE 1 TABLET (45 MG TOTAL) BY MOUTH DAILY. FOR DIABETES. 90 tablet 0   pravastatin (PRAVACHOL) 40 MG tablet TAKE 1 TABLET BY MOUTH EVERY DAY IN THE EVENING FOR CHOLESTEROL 90 tablet 1   testosterone cypionate (DEPOTESTOSTERONE CYPIONATE) 200 MG/ML injection Inject 200 mg into the muscle every 14 (fourteen) days.     No current facility-administered medications for this visit.    SURGICAL HISTORY:  Past Surgical History:  Procedure Laterality Date   COLONOSCOPY WITH PROPOFOL N/A 12/17/2018   Procedure: COLONOSCOPY WITH PROPOFOL;  Surgeon: Virgel Manifold, MD;  Location: [facility] ENDOSCOPY;  Service: Gastroenterology;  Laterality: N/A;   IR IMAGING GUIDED PORT INSERTION  04/17/2020   IR THORACENTESIS ASP PLEURAL SPACE W/IMG GUIDE  11/29/2021   IR THORACENTESIS ASP PLEURAL SPACE W/IMG GUIDE  01/03/2022   JOINT REPLACEMENT Bilateral     REPLACEMENT TOTAL KNEE BILATERAL  2015   TONSILLECTOMY  1960   TRANSTHORACIC ECHOCARDIOGRAM  05/2020   a) 05/2020: EF 55 to 60%.  No R WMA.  Mild LVH.  Indeterminate LVEDP.  Unable to assess RVP.  Normal aortic and mitral valves.  Mildly elevated RAP.; b) 09/2021: EF 50-55%. No RWMA. Mild LVH. ~ LVEDP. Mild LA Dilation. NORMAL RV/RAP.  Normal MV/AoV.    REVIEW OF SYSTEMS:   Constitutional: Negative for appetite change, chills, fatigue, fever and unexpected weight change.  HENT: Negative for mouth sores, nosebleeds, sore throat and trouble swallowing.   Eyes: Negative for eye problems and icterus.  Respiratory: Positive for improved dyspnea on exertion and cough. Negative for hemoptysis and wheezing.   Cardiovascular: Negative for chest pain and leg swelling.  Gastrointestinal: Negative for abdominal pain, constipation, diarrhea, nausea and vomiting.  Genitourinary: Negative for bladder incontinence, difficulty urinating, dysuria, frequency and hematuria.   Musculoskeletal: Negative for  gait problem, neck pain and neck stiffness.  Skin: Negative for itching and rash.  Neurological: Negative for dizziness, extremity weakness, gait problem, headaches, light-headedness and seizures.  Hematological: Negative for adenopathy. Does not bruise/bleed easily.  Psychiatric/Behavioral: Negative for confusion, depression and sleep disturbance. The patient is not nervous/anxious.     PHYSICAL EXAMINATION:  Blood pressure (!) 157/83, pulse (!) 101, temperature 98 F (36.7 C), temperature source Tympanic, resp. rate 20, height 5' 9" (1.753 m), weight 285 lb 1.6 oz (129.3 kg), SpO2 95 %.  ECOG PERFORMANCE STATUS: 1  Physical Exam  Constitutional: Oriented to person, place, and time and well-developed, well-nourished, and in no distress. No distress.  HENT:  Head: Normocephalic and atraumatic.  Mouth/Throat: Oropharynx is clear and moist. No oropharyngeal exudate.  Eyes: Conjunctivae are normal. Right  eye exhibits no discharge. Left eye exhibits no discharge. No scleral icterus.  Neck: Normal range of motion. Neck supple.  Cardiovascular: Normal rate, regular rhythm, normal heart sounds and intact distal pulses.   Pulmonary/Chest: Effort normal. Clear to ascultation bilaterally. No respiratory distress. No wheezes. No rales.  Abdominal: Soft. Bowel sounds are normal. Exhibits no distension and no mass. There is no tenderness.  Musculoskeletal: Normal range of motion. Exhibits no edema.  Lymphadenopathy:    No cervical adenopathy.  Neurological: Alert and oriented to person, place,  and time. Exhibits normal muscle tone. Gait normal. Coordination normal.  Skin: Skin is warm and dry. No rash noted. Not diaphoretic. No erythema. No pallor.  Psychiatric: Mood, memory and judgment normal.  Vitals reviewed.  LABORATORY DATA: Lab Results  Component Value Date   WBC 9.9 01/23/2022   HGB 10.7 (L) 01/23/2022   HCT 33.1 (L) 01/23/2022   MCV 90.2 01/23/2022   PLT 319 01/23/2022      Chemistry      Component Value Date/Time   NA 138 01/23/2022 1249   K 4.2 01/23/2022 1249   CL 101 01/23/2022 1249   CO2 28 01/23/2022 1249   BUN 16 01/23/2022 1249   CREATININE 1.35 (H) 01/23/2022 1249   CREATININE 1.45 (H) 10/18/2021 1533      Component Value Date/Time   CALCIUM 9.0 01/23/2022 1249   ALKPHOS 56 01/23/2022 1249   AST 19 01/23/2022 1249   ALT 16 01/23/2022 1249   BILITOT 0.4 01/23/2022 1249       RADIOGRAPHIC STUDIES:  DG Chest 1 View  Result Date: 01/03/2022 CLINICAL DATA:  Post right thoracentesis EXAM: CHEST  1 VIEW COMPARISON:  12/20/2021 FINDINGS: Decreased right pleural effusion. No pneumothorax. Right upper lobe scarring again noted from prior cancer treatment. Left lung clear. Port-A-Cath tip at the cavoatrial junction IMPRESSION: No complication post right thoracentesis. Electronically Signed   By: Franchot Gallo M.D.   On: 01/03/2022 12:55   DG Chest 2 View  Result  Date: 01/16/2022 CLINICAL DATA:  Pleural effusion. EXAM: CHEST - 2 VIEW COMPARISON:  January 03, 2022. FINDINGS: Stable cardiomediastinal silhouette. Stable right internal jugular Port-A-Cath. Stable right upper lobe scarring. Left lung is clear. Stable elevated right hemidiaphragm. Bony thorax is unremarkable. IMPRESSION: Stable chronic findings as described above. No acute abnormality seen. Electronically Signed   By: Marijo Conception M.D.   On: 01/16/2022 14:21   IR THORACENTESIS ASP PLEURAL SPACE W/IMG GUIDE  Result Date: 01/03/2022 INDICATION: Patient with a history small cell lung cancer with recurrent right pleural effusions presents today for therapeutic thoracentesis. EXAM: ULTRASOUND GUIDED THORACENTESIS MEDICATIONS: 1% lidocaine 10 mL COMPLICATIONS: None immediate. PROCEDURE: An ultrasound guided thoracentesis was thoroughly discussed with the patient and questions answered. The benefits, risks, alternatives and complications were also discussed. The patient understands and wishes to proceed with the procedure. Written consent was obtained. Ultrasound was performed to localize and mark an adequate pocket of fluid in the right chest. The area was then prepped and draped in the normal sterile fashion. 1% Lidocaine was used for local anesthesia. Under ultrasound guidance a 6 Fr Safe-T-Centesis catheter was introduced. Thoracentesis was performed. The catheter was removed and a dressing applied. FINDINGS: A total of approximately 1750 mL of amber-colored fluid was removed. IMPRESSION: Successful ultrasound guided right thoracentesis yielding 1750 mL of pleural fluid. Read by: Soyla Dryer, NP Electronically Signed   By: Jerilynn Mages.  Shick M.D.   On: 01/03/2022 13:18     ASSESSMENT/PLAN:  This is a very pleasant 71 year old Caucasian male diagnosed with extensive stage (T4, N3, M1C) small cell lung cancer presented with large right upper lobe lung mass in addition to mediastinal and right supraclavicular  lymphadenopathy and multiple metastatic liver lesions diagnosed in April 2021.   The patient initially underwent systemic chemotherapy with carboplatin for an AUC of 5 on day 1, etoposide 100 mg per metered squared on days 1, 2, and 3 in addition to [facility] for myeloprotection.  He also received immunotherapy with Imfinzi on  day 1 of every chemotherapy cycle.  He is status post 8 cycles.  Starting from cycle #5 he was on single agent immunotherapy with Imfinzi IV every 4 weeks  The patient had evidence of disease progression on his scan from December 2021.   He was then started on systemic chemotherapy with carboplatin for an AUC of 5 on day 1, etoposide 100 mg per metered squared on days 1, 2, and 3 in addition to Tecentriq 1200 mg on day 1, the patient is status post 8 cycles.  Starting from cycle #5, the patient started maintenance immunotherapy with Tecentriq.  This was discontinued due to evidence of disease progression.   The patient is currently undergoing palliative systemic chemotherapy with Zepzelca IV every 3 weeks.  He is status post his 11 cycles and he tolerated it well except for fatigue a few days after treatment.    The patient's last scan showed: The liver lesions have decreased in size. There was some increase in prominent nodular soft tissue density today. Radiology commented that while this may be treatment related, close attention on follow-up recommended as recurrent disease not excluded. We will need to continue to monitor this closely though on follow-up imaging.  We will arrange follow up imaging today.  I will arrange for him to have a repeat CT scan of the chest, abdomen, and pelvis prior to starting his next cycle of treatment.     Labs were reviewed.  Recommend that he proceed with cycle #12 today scheduled  We will see the patient back for follow-up visit in 3 weeks for evaluation before starting cycle #13.    He will to continue taking his blood thinner for his history  of pulmonary embolism.  The patient was advised to call immediately if he has any concerning symptoms in the interval. The patient voices understanding of current disease status and treatment options and is in agreement with the current care plan. All questions were answered. The patient knows to call the clinic with any problems, questions or concerns. We can certainly see the patient much sooner if necessary       Orders Placed This Encounter  Procedures   CT Chest W Contrast    Standing Status:   Future    Standing Expiration Date:   01/23/2023    Order Specific Question:   If indicated for the ordered procedure, I authorize the administration of contrast media per Radiology protocol    Answer:   Yes    Order Specific Question:   Preferred imaging location?    Answer:   [facility]   CT Abdomen Pelvis W Contrast    Standing Status:   Future    Standing Expiration Date:   01/23/2023    Order Specific Question:   If indicated for the ordered procedure, I authorize the administration of contrast media per Radiology protocol    Answer:   Yes    Order Specific Question:   Preferred imaging location?    Answer:   [facility]    Order Specific Question:   Is Oral Contrast requested for this exam?    Answer:   Yes, Per Radiology protocol     The total time spent in the appointment was 20-29 minutes.   Shamyah Stantz L Trivia Heffelfinger, PA-C 01/23/22

## 2022-01-23 ENCOUNTER — Other Ambulatory Visit: Payer: Self-pay

## 2022-01-23 ENCOUNTER — Inpatient Hospital Stay: Payer: Medicare Other

## 2022-01-23 ENCOUNTER — Inpatient Hospital Stay: Payer: Medicare Other | Attending: Internal Medicine

## 2022-01-23 ENCOUNTER — Inpatient Hospital Stay (HOSPITAL_BASED_OUTPATIENT_CLINIC_OR_DEPARTMENT_OTHER): Payer: Medicare Other | Admitting: Physician Assistant

## 2022-01-23 ENCOUNTER — Encounter: Payer: Self-pay | Admitting: Physician Assistant

## 2022-01-23 VITALS — BP 157/83 | HR 101 | Temp 98.0°F | Resp 20 | Ht 69.0 in | Wt 285.1 lb

## 2022-01-23 VITALS — HR 99

## 2022-01-23 DIAGNOSIS — Z5111 Encounter for antineoplastic chemotherapy: Secondary | ICD-10-CM | POA: Insufficient documentation

## 2022-01-23 DIAGNOSIS — J90 Pleural effusion, not elsewhere classified: Secondary | ICD-10-CM | POA: Insufficient documentation

## 2022-01-23 DIAGNOSIS — Z7901 Long term (current) use of anticoagulants: Secondary | ICD-10-CM | POA: Insufficient documentation

## 2022-01-23 DIAGNOSIS — C787 Secondary malignant neoplasm of liver and intrahepatic bile duct: Secondary | ICD-10-CM | POA: Diagnosis not present

## 2022-01-23 DIAGNOSIS — Z86711 Personal history of pulmonary embolism: Secondary | ICD-10-CM | POA: Insufficient documentation

## 2022-01-23 DIAGNOSIS — Z95828 Presence of other vascular implants and grafts: Secondary | ICD-10-CM

## 2022-01-23 DIAGNOSIS — C3411 Malignant neoplasm of upper lobe, right bronchus or lung: Secondary | ICD-10-CM | POA: Insufficient documentation

## 2022-01-23 LAB — CBC WITH DIFFERENTIAL (CANCER CENTER ONLY)
Abs Immature Granulocytes: 0.31 10*3/uL — ABNORMAL HIGH (ref 0.00–0.07)
Basophils Absolute: 0.1 10*3/uL (ref 0.0–0.1)
Basophils Relative: 1 %
Eosinophils Absolute: 0.1 10*3/uL (ref 0.0–0.5)
Eosinophils Relative: 1 %
HCT: 33.1 % — ABNORMAL LOW (ref 39.0–52.0)
Hemoglobin: 10.7 g/dL — ABNORMAL LOW (ref 13.0–17.0)
Immature Granulocytes: 3 %
Lymphocytes Relative: 6 %
Lymphs Abs: 0.6 10*3/uL — ABNORMAL LOW (ref 0.7–4.0)
MCH: 29.2 pg (ref 26.0–34.0)
MCHC: 32.3 g/dL (ref 30.0–36.0)
MCV: 90.2 fL (ref 80.0–100.0)
Monocytes Absolute: 1 10*3/uL (ref 0.1–1.0)
Monocytes Relative: 10 %
Neutro Abs: 7.9 10*3/uL — ABNORMAL HIGH (ref 1.7–7.7)
Neutrophils Relative %: 79 %
Platelet Count: 319 10*3/uL (ref 150–400)
RBC: 3.67 MIL/uL — ABNORMAL LOW (ref 4.22–5.81)
RDW: 15 % (ref 11.5–15.5)
WBC Count: 9.9 10*3/uL (ref 4.0–10.5)
nRBC: 0 % (ref 0.0–0.2)

## 2022-01-23 LAB — CMP (CANCER CENTER ONLY)
ALT: 16 U/L (ref 0–44)
AST: 19 U/L (ref 15–41)
Albumin: 3.7 g/dL (ref 3.5–5.0)
Alkaline Phosphatase: 56 U/L (ref 38–126)
Anion gap: 9 (ref 5–15)
BUN: 16 mg/dL (ref 8–23)
CO2: 28 mmol/L (ref 22–32)
Calcium: 9 mg/dL (ref 8.9–10.3)
Chloride: 101 mmol/L (ref 98–111)
Creatinine: 1.35 mg/dL — ABNORMAL HIGH (ref 0.61–1.24)
GFR, Estimated: 56 mL/min — ABNORMAL LOW (ref >60)
Glucose, Bld: 155 mg/dL — ABNORMAL HIGH (ref 70–99)
Potassium: 4.2 mmol/L (ref 3.5–5.1)
Sodium: 138 mmol/L (ref 135–145)
Total Bilirubin: 0.4 mg/dL (ref 0.3–1.2)
Total Protein: 6.8 g/dL (ref 6.5–8.1)

## 2022-01-23 MED ORDER — SODIUM CHLORIDE 0.9% FLUSH
10.0000 mL | INTRAVENOUS | Status: DC | PRN
  Administered 2022-01-23: 10 mL

## 2022-01-23 MED ORDER — SODIUM CHLORIDE 0.9 % IV SOLN: Freq: Once | INTRAVENOUS | Status: AC

## 2022-01-23 MED ORDER — SODIUM CHLORIDE 0.9% FLUSH
10.0000 mL | Freq: Once | INTRAVENOUS | Status: AC
  Administered 2022-01-23: 10 mL

## 2022-01-23 MED ORDER — SODIUM CHLORIDE 0.9 % IV SOLN
3.1400 mg/m2 | Freq: Once | INTRAVENOUS | Status: AC
  Administered 2022-01-23: 8 mg via INTRAVENOUS
  Filled 2022-01-23: qty 16

## 2022-01-23 MED ORDER — PALONOSETRON HCL INJECTION 0.25 MG/5ML
0.2500 mg | Freq: Once | INTRAVENOUS | Status: AC
  Administered 2022-01-23: 0.25 mg via INTRAVENOUS
  Filled 2022-01-23: qty 5

## 2022-01-23 MED ORDER — HEPARIN SOD (PORK) LOCK FLUSH 100 UNIT/ML IV SOLN
500.0000 [IU] | Freq: Once | INTRAVENOUS | Status: AC | PRN
  Administered 2022-01-23: 500 [IU]

## 2022-01-23 MED ORDER — SODIUM CHLORIDE 0.9 % IV SOLN
10.0000 mg | Freq: Once | INTRAVENOUS | Status: AC
  Administered 2022-01-23: 10 mg via INTRAVENOUS
  Filled 2022-01-23: qty 10

## 2022-01-23 NOTE — Patient Instructions (Signed)
[facility] ONCOLOGY  Discharge Instructions: Thank you for choosing [facility] to provide your oncology and hematology care.   If you have a lab appointment with the [facility], please go directly to the [facility] and check in at the registration area.   Wear comfortable clothing and clothing appropriate for easy access to any Portacath or PICC line.   We strive to give you quality time with your provider. You may need to reschedule your appointment if you arrive late (15 or more minutes).  Arriving late affects you and other patients whose appointments are after yours.  Also, if you miss three or more appointments without notifying the office, you may be dismissed from the clinic at the providers discretion.      For prescription refill requests, have your pharmacy contact our office and allow 72 hours for refills to be completed.    Today you received the following chemotherapy and/or immunotherapy agents: Zepzelca    To help prevent nausea and vomiting after your treatment, we encourage you to take your nausea medication as directed.  BELOW ARE SYMPTOMS THAT SHOULD BE REPORTED IMMEDIATELY: *FEVER GREATER THAN 100.4 F (38 C) OR HIGHER *CHILLS OR SWEATING *NAUSEA AND VOMITING THAT IS NOT CONTROLLED WITH YOUR NAUSEA MEDICATION *UNUSUAL SHORTNESS OF BREATH *UNUSUAL BRUISING OR BLEEDING *URINARY PROBLEMS (pain or burning when urinating, or frequent urination) *BOWEL PROBLEMS (unusual diarrhea, constipation, pain near the anus) TENDERNESS IN MOUTH AND THROAT WITH OR WITHOUT PRESENCE OF ULCERS (sore throat, sores in mouth, or a toothache) UNUSUAL RASH, SWELLING OR PAIN  UNUSUAL VAGINAL DISCHARGE OR ITCHING   Items with * indicate a potential emergency and should be followed up as soon as possible or go to the Emergency Department if any problems should occur.  Please show the CHEMOTHERAPY ALERT CARD or IMMUNOTHERAPY ALERT CARD at check-in to the  Emergency Department and triage nurse.  Should you have questions after your visit or need to cancel or reschedule your appointment, please contact [facility]  Dept: [phone]  and follow the prompts.  Office hours are 8:00 a.m. to 4:30 p.m. Monday - Friday. Please note that voicemails left after 4:00 p.m. may not be returned until the following business day.  We are closed weekends and major holidays. You have access to a nurse at all times for urgent questions. Please call the main number to the clinic Dept: [phone] and follow the prompts.   For any non-urgent questions, you may also contact your provider using MyChart. We now offer e-Visits for anyone 110 and older to request care online for non-urgent symptoms. For details visit [email or web address].   Also download the MyChart app! Go to the app store, search "MyChart", open the app, select [facility], and log in with your MyChart username and password.  Due to Covid, a mask is required upon entering the hospital/clinic. If you do not have a mask, one will be given to you upon arrival. For doctor visits, patients may have 1 support person aged 65 or older with them. For treatment visits, patients cannot have anyone with them due to current Covid guidelines and our immunocompromised population.

## 2022-01-24 DIAGNOSIS — C787 Secondary malignant neoplasm of liver and intrahepatic bile duct: Secondary | ICD-10-CM | POA: Diagnosis not present

## 2022-01-24 DIAGNOSIS — J90 Pleural effusion, not elsewhere classified: Secondary | ICD-10-CM | POA: Diagnosis not present

## 2022-01-24 DIAGNOSIS — Z5111 Encounter for antineoplastic chemotherapy: Secondary | ICD-10-CM | POA: Diagnosis not present

## 2022-01-24 DIAGNOSIS — C3411 Malignant neoplasm of upper lobe, right bronchus or lung: Secondary | ICD-10-CM | POA: Diagnosis not present

## 2022-01-24 DIAGNOSIS — Z86711 Personal history of pulmonary embolism: Secondary | ICD-10-CM | POA: Diagnosis not present

## 2022-01-24 DIAGNOSIS — Z7901 Long term (current) use of anticoagulants: Secondary | ICD-10-CM | POA: Diagnosis not present

## 2022-01-26 IMAGING — DX DG CHEST 2V PORT
2 series · 2 of 2 positions shown · non-contrast
Comparison: CT 09/25/2017

CLINICAL DATA: Cough and short of breath

EXAM:
CHEST  2 VIEW PORTABLE

[x chest pa grid]
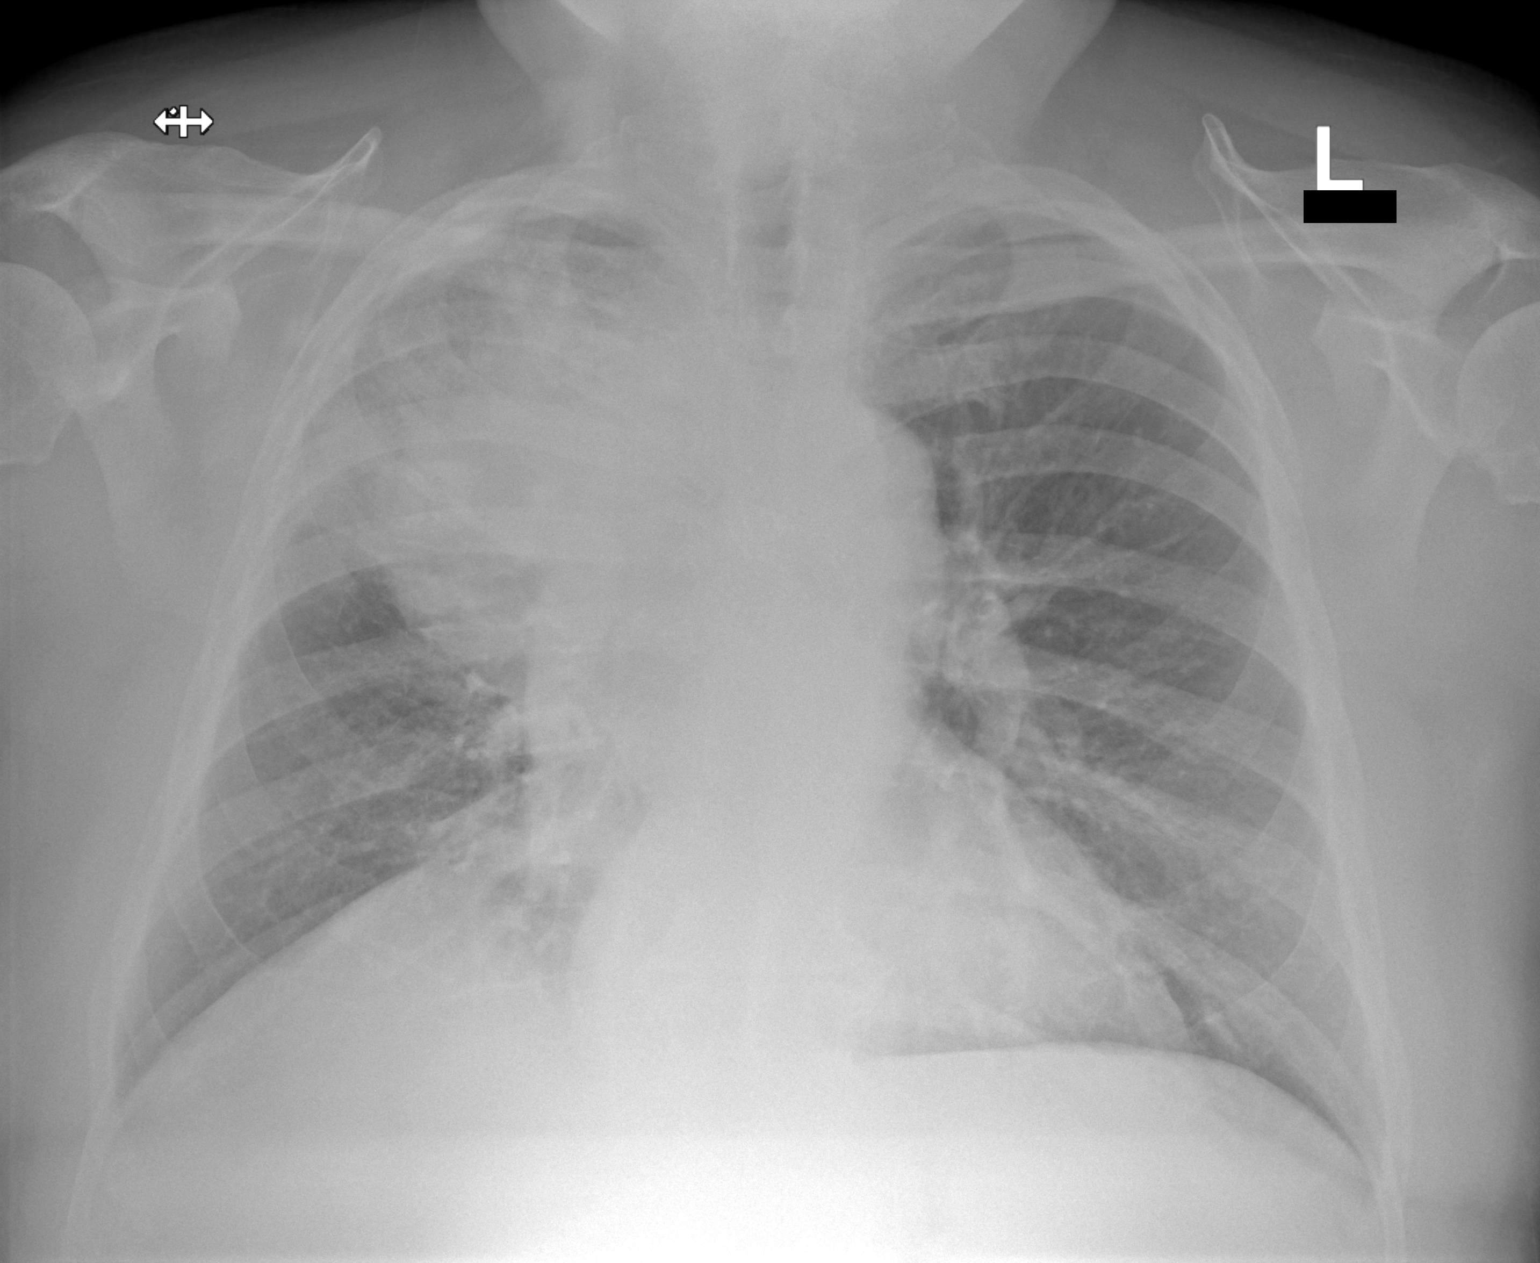

[x chest lat grid]
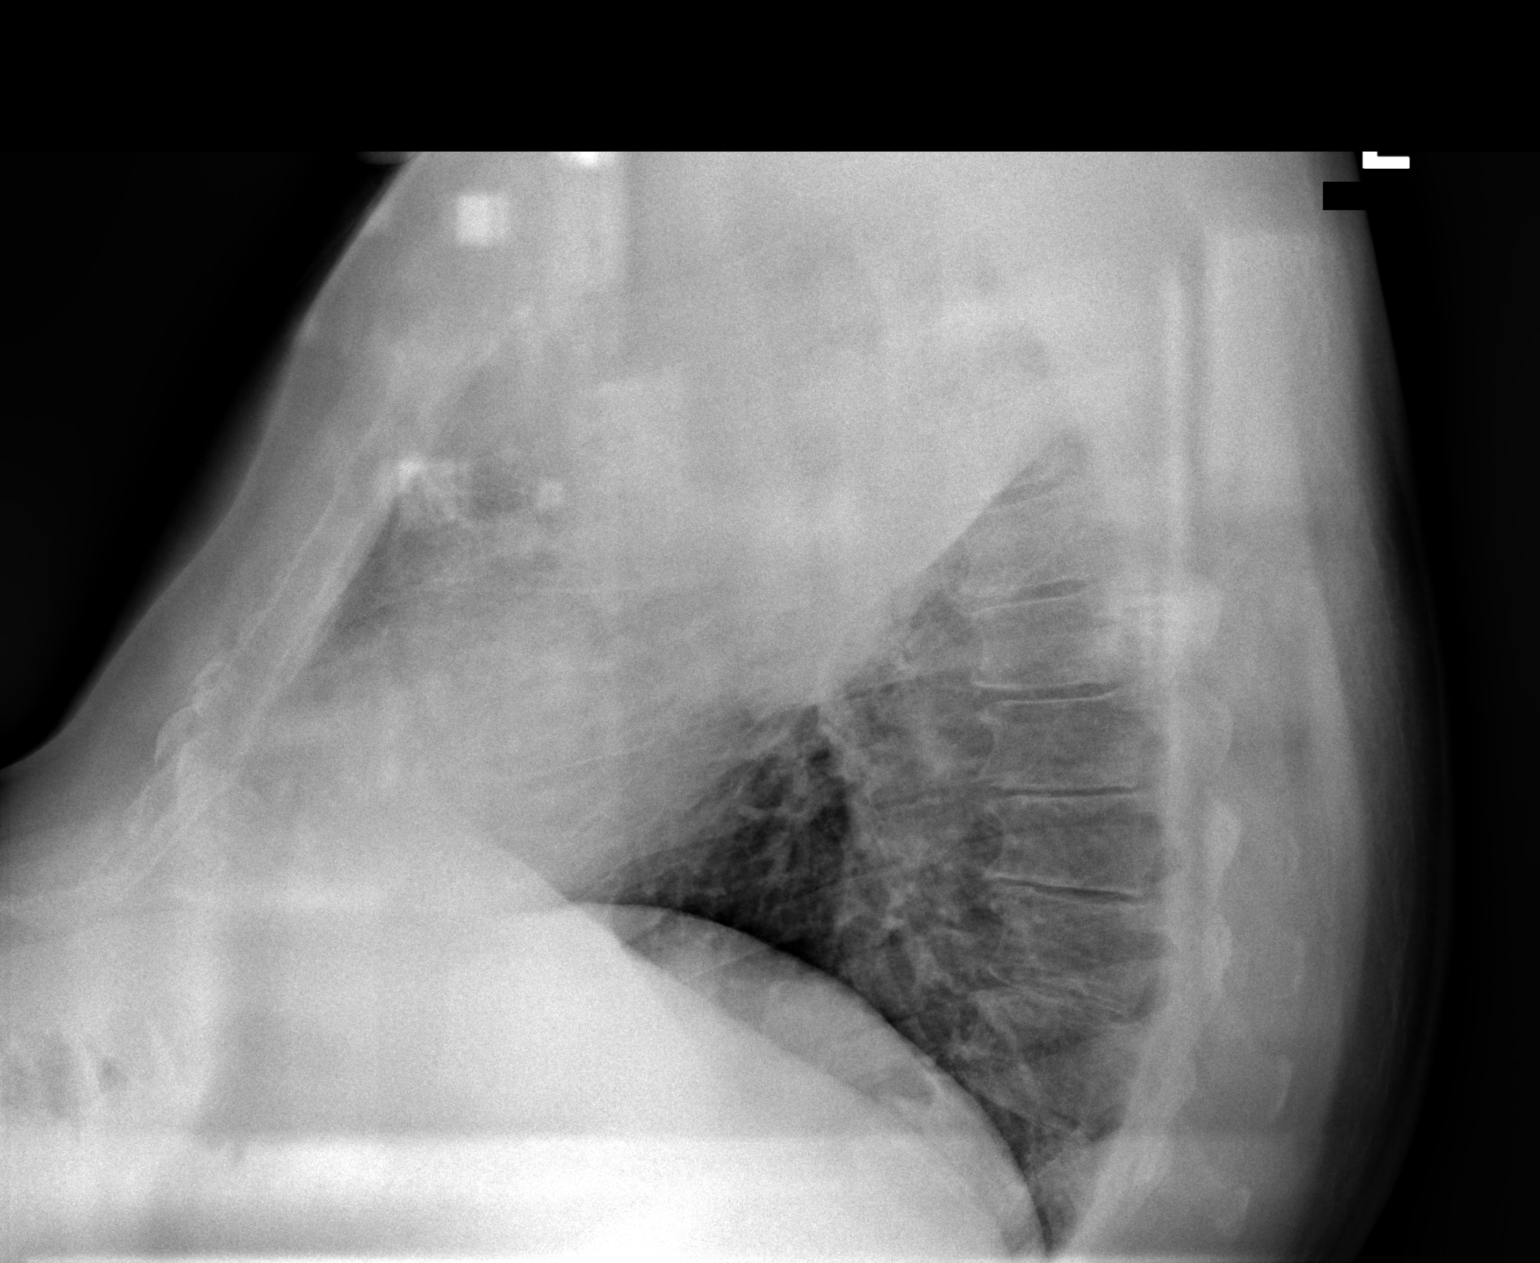

[2 of 2 positions shown; findings below may reference images not displayed]

FINDINGS: Right upper lobe consolidative process. No pleural effusion.
Borderline cardiomegaly. No pneumothorax.
IMPRESSION: Right upper lobe consolidative process, may reflect pneumonia in the
appropriate clinical setting. Radiographic follow-up to resolution
is advised as potential lung mass could also produce this
appearance. If more urgent imaging is desired or clinical symptoms
are not consistent with pneumonia, contrast-enhanced chest CT could
be obtained for further evaluation.

## 2022-01-28 ENCOUNTER — Encounter: Payer: Self-pay | Admitting: Internal Medicine

## 2022-01-28 DIAGNOSIS — Z20822 Contact with and (suspected) exposure to covid-19: Secondary | ICD-10-CM | POA: Diagnosis not present

## 2022-02-06 ENCOUNTER — Other Ambulatory Visit: Payer: Self-pay

## 2022-02-06 ENCOUNTER — Ambulatory Visit (HOSPITAL_COMMUNITY)
Admission: RE | Admit: 2022-02-06 | Discharge: 2022-02-06 | Disposition: A | Discharge status: Home / Self Care | Payer: Medicare Other | Source: Ambulatory Visit | Attending: Physician Assistant | Admitting: Physician Assistant

## 2022-02-06 DIAGNOSIS — E278 Other specified disorders of adrenal gland: Secondary | ICD-10-CM | POA: Diagnosis not present

## 2022-02-06 DIAGNOSIS — C3411 Malignant neoplasm of upper lobe, right bronchus or lung: Secondary | ICD-10-CM | POA: Diagnosis not present

## 2022-02-06 DIAGNOSIS — C787 Secondary malignant neoplasm of liver and intrahepatic bile duct: Secondary | ICD-10-CM | POA: Diagnosis not present

## 2022-02-06 DIAGNOSIS — C3490 Malignant neoplasm of unspecified part of unspecified bronchus or lung: Secondary | ICD-10-CM | POA: Diagnosis not present

## 2022-02-06 DIAGNOSIS — R918 Other nonspecific abnormal finding of lung field: Secondary | ICD-10-CM | POA: Diagnosis not present

## 2022-02-06 DIAGNOSIS — J90 Pleural effusion, not elsewhere classified: Secondary | ICD-10-CM | POA: Diagnosis not present

## 2022-02-06 DIAGNOSIS — K7689 Other specified diseases of liver: Secondary | ICD-10-CM | POA: Diagnosis not present

## 2022-02-06 DIAGNOSIS — N281 Cyst of kidney, acquired: Secondary | ICD-10-CM | POA: Diagnosis not present

## 2022-02-06 MED ORDER — HEPARIN SOD (PORK) LOCK FLUSH 100 UNIT/ML IV SOLN
INTRAVENOUS | Status: AC
  Filled 2022-02-06: qty 5

## 2022-02-06 MED ORDER — IOHEXOL 300 MG/ML  SOLN
100.0000 mL | Freq: Once | INTRAMUSCULAR | Status: AC | PRN
  Administered 2022-02-06: 100 mL via INTRAVENOUS

## 2022-02-06 MED ORDER — HEPARIN SOD (PORK) LOCK FLUSH 100 UNIT/ML IV SOLN
500.0000 [IU] | Freq: Once | INTRAVENOUS | Status: AC
  Administered 2022-02-06: 500 [IU] via INTRAVENOUS

## 2022-02-07 NOTE — Progress Notes (Signed)
[place] OFFICE PROGRESS NOTE  [name], NP [place] [address]  DIAGNOSIS: Relapsed extensive stage (T4, N3, M1c)  small cell lung cancer diagnosed in April 2021 and presented with large right upper lobe/right hilar mass with ipsilateral and contralateral mediastinal and right supraclavicular lymphadenopathy in addition to multiple liver lesions. He has disease progression after the first line of chemotherapy in December 2021.  PRIOR THERAPY:  1) Palliative radiotherapy to the right upper lobe lung mass under the care of Dr. Lisbeth Renshaw. 2) Systemic chemotherapy with carboplatin for AUC of 5 on day 1, etoposide 100 mg/M2 on days 1, 2 and 3 in addition to Imfinzi 1500 mg IV every 3 weeks with chemotherapy then every 4 weeks for maintenance if the patient has no evidence for progression.  He will also receive Cosela 240 mg/m2 on the days of the chemotherapy.  Status post 9 cycles.  Starting from cycle #5 the patient will be on maintenance treatment with immunotherapy with Imfinzi 1500 mg IV every 4 weeks. Last dose of chemotherapy was given on November 13, 2020. This treatment was discontinued secondary to disease progression 3) Systemic chemotherapy with carboplatin for AUC of 5 on day 1, etoposide 100 mg/M2 on days 1, 2 and 3, Tecentriq 1200 mg IV every 3 weeks as well as Cosela 250 mg/M2 on the days of the chemotherapy every 3 weeks.  First dose December 18, 2020.  Status post 8 cycles. 4) Zepzelca 3.2 mgm2 IV every 3 weeks. Last dose on 01/23/22. Status post 12 cycles.   CURRENT THERAPY: Palliative systemic chemotherapy with irinotecan 65 mg per metered squared on days 1 and 8 IV every 3 weeks.   INTERVAL HISTORY: Adam Wise 71 y.o. male returns to the clinic today for a follow-up visit accompanied by his wife.  The patient is feeling fairly well today without any concerning complaints.  The patient recently had a talc pleurodesis performed and he is very  pleased with the results of this procedure.  He is scheduled to follow up with Dr. Valeta Harms. He still has baseline dyspnea on exertion but overall his breathing is improved and he is able to take deeper breaths.  The patient reports less wheezing but he does wheeze. He denies any fever or chills.  His weight is stable.  He is compliant with his blood thinner for his history of pulmonary embolism.  He denies any chest pain or hemoptysis. He has mild cough at night. Denies any nausea, vomiting, diarrhea, or constipation.  Denies any headache or visual changes.  He reports some visual changes secondary to being overdue for his annual eye exam for his diabetes.  The patient recently had a restaging CT scan performed.  The patient is here today for evaluation and repeat blood work before starting cycle #13.   MEDICAL HISTORY: Past Medical History:  Diagnosis Date   Chickenpox    Chronic knee pain    Chronic low back pain    Coronary artery calcification seen on CAT scan 11/2021   Coronary CTA 11/27/2021: Moderate to large right pleural effusion and compressive atelectasis right lung base. => Coronary Calcium Score 657.  Diffuse RCA calcification.  Minimal mild disease in the LAD and diagonal branches. == Overall limited study.  Notable artifact.   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 He has disease progression after the first line of chemotherapy in December 2021.   Testosterone deficiency    Type 2 diabetes mellitus (HCC)     ALLERGIES:  is allergic to bupropion and hydrochlorothiazide.  MEDICATIONS:  Current Outpatient Medications  Medication Sig Dispense  Refill   albuterol (VENTOLIN HFA) 108 (90 Base) MCG/ACT inhaler Inhale 2 puffs into the lungs every 6 (six) hours as needed for wheezing or shortness of breath. 18 g 0   amLODipine (NORVASC) 10 MG tablet TAKE 1 TABLET BY MOUTH EVERY DAY FOR BLOOD PRESSURE 90 tablet 3   apixaban (ELIQUIS) 5 MG TABS tablet Take 1 tablet (5 mg total) by mouth 2 (two) times daily. 180 tablet 2   B-D 3CC LUER-LOK SYR 22GX1" 22G X 1" 3 ML MISC USE AS INSTRUCTED FOR TESTOSTERONE INJECTION EVERY 2 WEEKS 10 each 2   bisoprolol (ZEBETA) 5 MG tablet Take 1 tablet (5 mg total) by mouth daily. 30 tablet 6   Black Pepper-Turmeric (TURMERIC COMPLEX/BLACK PEPPER PO) Take 1 tablet by mouth in the morning and at bedtime.     blood glucose meter kit and supplies KIT Dispense based on patient and insurance preference. Use up to four times daily as directed. 1 each 0   chlorpheniramine-HYDROcodone (TUSSIONEX) 10-8 MG/5ML SUER Take 5 mLs by mouth at bedtime as needed for cough. 473 mL 0   esomeprazole (NEXIUM) 20 MG capsule Take 20 mg by mouth in the morning.     gabapentin (NEURONTIN) 300 MG capsule Take 300 mg by mouth at bedtime.     glipiZIDE (GLUCOTROL) 10 MG tablet TAKE 1 TABLET (10 MG TOTAL) BY MOUTH 2 (TWO) TIMES DAILY BEFORE A MEAL. FOR DIABETES. 180 tablet 3   Insulin Glargine (BASAGLAR KWIKPEN) 100 UNIT/ML Inject 20 Units into the skin at bedtime. For diabetes. 30 mL 1   Insulin Pen Needle (BD PEN NEEDLE NANO U/F) 32G X 4 MM MISC Use with insulin as prescribed  Dx Code: E11.9 100 each 2   ipratropium-albuterol (DUONEB) 0.5-2.5 (3) MG/3ML SOLN Take 3 mLs by nebulization every 4 (four) hours while awake for 3 days, THEN 3 mLs every 4 (four) hours as needed (shortness of breath or wheezing). 360 mL 0   Lancets MISC USE UP TO 3 TIMES DAILY AS DIRECTED 100 each 2   losartan (COZAAR) 50 MG tablet Take 1 tablet (50 mg total) by mouth daily. For blood pressure. 90 tablet 3   Melatonin 10 MG CAPS Take 10 mg by mouth at bedtime.      metFORMIN (GLUCOPHAGE) 1000 MG tablet TAKE 1 TABLET (1,000 MG TOTAL) BY MOUTH 2 (TWO) TIMES DAILY WITH A MEAL. FOR DIABETES 180 tablet 1   Multiple Vitamin (MULTI-VITAMINS) TABS Take 1 tablet by mouth in the morning.     Omega-3 Fatty Acids (SALMON OIL PO) Take 1 capsule by mouth daily.     ONETOUCH ULTRA test strip USE UP TO 4 TIMES DAILY AS DIRECTED 100 strip 5   oxyCODONE-acetaminophen (PERCOCET) 5-325 MG tablet Take 1-2 tablets by mouth every 4 (four) hours as needed for severe pain. 20 tablet 0   oxymetazoline (AFRIN) 0.05 % nasal spray Place 1 spray into both nostrils daily.     pioglitazone (ACTOS) 45 MG tablet TAKE 1 TABLET (45 MG TOTAL) BY MOUTH DAILY. FOR  DIABETES. 90 tablet 0   pravastatin (PRAVACHOL) 40 MG tablet TAKE 1 TABLET BY MOUTH EVERY DAY IN THE EVENING FOR CHOLESTEROL 90 tablet 1   testosterone cypionate (DEPOTESTOSTERONE CYPIONATE) 200 MG/ML injection Inject 200 mg into the muscle every 14 (fourteen) days.     No current facility-administered medications for this visit.    SURGICAL HISTORY:  Past Surgical History:  Procedure Laterality Date   CHEST TUBE INSERTION Right 01/20/2022   Procedure: CHEST TUBE INSERTION;  Surgeon: Candee Furbish, MD;  Location: [facility] ENDOSCOPY;  Service: Pulmonary;  Laterality: Right;  w/ Talc Pleurodesis, planned admit for Obs afterwards   COLONOSCOPY WITH PROPOFOL N/A 12/17/2018   Procedure: COLONOSCOPY WITH PROPOFOL;  Surgeon: Virgel Manifold, MD;  Location: [facility] ENDOSCOPY;  Service: Gastroenterology;  Laterality: N/A;   IR IMAGING GUIDED PORT INSERTION  04/17/2020   IR THORACENTESIS ASP PLEURAL SPACE W/IMG GUIDE  11/29/2021   IR THORACENTESIS ASP PLEURAL SPACE W/IMG GUIDE  01/03/2022   JOINT REPLACEMENT Bilateral    REPLACEMENT TOTAL KNEE BILATERAL  2015   TONSILLECTOMY  1960   TRANSTHORACIC ECHOCARDIOGRAM  05/2020   a) 05/2020: EF 55 to 60%.  No R WMA.  Mild LVH.  Indeterminate LVEDP.  Unable to assess RVP.  Normal aortic and mitral  valves.  Mildly elevated RAP.; b) 09/2021: EF 50-55%. No RWMA. Mild LVH. ~ LVEDP. Mild LA Dilation. NORMAL RV/RAP.  Normal MV/AoV.    REVIEW OF SYSTEMS:   Constitutional: Negative for appetite change, chills, fatigue, fever and unexpected weight change.  HENT: Negative for mouth sores, nosebleeds, sore throat and trouble swallowing.   Eyes: Negative for eye problems and icterus.  Respiratory: Positive for improved dyspnea on exertion and cough. Negative for hemoptysis and wheezing.   Cardiovascular: Negative for chest pain and leg swelling.  Gastrointestinal: Negative for abdominal pain, constipation, diarrhea, nausea and vomiting.  Genitourinary: Negative for bladder incontinence, difficulty urinating, dysuria, frequency and hematuria.   Musculoskeletal: Negative for  gait problem, neck pain and neck stiffness.  Skin: Negative for itching and rash.  Neurological: Negative for dizziness, extremity weakness, gait problem, headaches, light-headedness and seizures.  Hematological: Negative for adenopathy. Does not bruise/bleed easily.  Psychiatric/Behavioral: Negative for confusion, depression and sleep disturbance. The patient is not nervous/anxious.     PHYSICAL EXAMINATION:  Blood pressure 138/75, pulse 84, temperature (!) 97.5 F (36.4 C), temperature source Oral, resp. rate 19, height $RemoveBe'5\' 9"'VlwgeoDmS$  (1.753 m), weight 287 lb 6.4 oz (130.4 kg), SpO2 93 %.  ECOG PERFORMANCE STATUS: 1  Physical Exam  Constitutional: Oriented to person, place, and time and well-developed, well-nourished, and in no distress. No distress.  HENT:  Head: Normocephalic and atraumatic.  Mouth/Throat: Oropharynx is clear and moist. No oropharyngeal exudate.  Eyes: Conjunctivae are normal. Right eye exhibits no discharge. Left eye exhibits no discharge. No scleral icterus.  Neck: Normal range of motion. Neck supple.  Cardiovascular: Normal rate, regular rhythm, normal heart sounds and intact distal pulses.    Pulmonary/Chest: Effort normal. Decreased breath sounds in right. No respiratory distress. No wheezes. No rales.  Abdominal: Soft. Bowel sounds are normal. Exhibits no distension and no mass. There is no tenderness.  Musculoskeletal: Normal range of motion. Exhibits no edema.  Lymphadenopathy:    No cervical adenopathy.  Neurological: Alert and oriented to person, place, and time. Exhibits normal muscle tone. Gait normal. Coordination normal.  Skin: Skin is warm and dry. No rash noted. Not diaphoretic. No erythema. No pallor.  Psychiatric: Mood, memory and  judgment normal.  Vitals reviewed.  LABORATORY DATA: Lab Results  Component Value Date   WBC 3.0 (L) 02/11/2022   HGB 10.4 (L) 02/11/2022   HCT 31.7 (L) 02/11/2022   MCV 89.0 02/11/2022   PLT 234 02/11/2022      Chemistry      Component Value Date/Time   NA 140 02/11/2022 1038   K 4.1 02/11/2022 1038   CL 105 02/11/2022 1038   CO2 29 02/11/2022 1038   BUN 17 02/11/2022 1038   CREATININE 1.17 02/11/2022 1038   CREATININE 1.45 (H) 10/18/2021 1533      Component Value Date/Time   CALCIUM 8.9 02/11/2022 1038   ALKPHOS 60 02/11/2022 1038   AST 16 02/11/2022 1038   ALT 15 02/11/2022 1038   BILITOT 0.4 02/11/2022 1038       RADIOGRAPHIC STUDIES:  DG Chest 2 View  Result Date: 01/16/2022 CLINICAL DATA:  Pleural effusion. EXAM: CHEST - 2 VIEW COMPARISON:  January 03, 2022. FINDINGS: Stable cardiomediastinal silhouette. Stable right internal jugular Port-A-Cath. Stable right upper lobe scarring. Left lung is clear. Stable elevated right hemidiaphragm. Bony thorax is unremarkable. IMPRESSION: Stable chronic findings as described above. No acute abnormality seen. Electronically Signed   By: Marijo Conception M.D.   On: 01/16/2022 14:21   CT Chest W Contrast  Result Date: 02/07/2022 CLINICAL DATA:  Primary Cancer Type: Lung Imaging Indication: Assess response to therapy Interval therapy since last imaging? Yes Initial Cancer  Diagnosis Date: 04/09/2020; Established by: Biopsy-proven Detailed Pathology: Extensive stage small cell lung cancer. Primary Tumor location:  Right upper lobe. Metastatic liver lesions. Surgeries: No. Chemotherapy: Yes; Ongoing? Yes; Zepzelca; Most recent administration: 01/23/2022 Immunotherapy?  Yes; Type: Imfinzi, Tecentriq; Ongoing? No Radiation therapy? Yes; Date Range: 04/12/2020 - 05/02/2020; Target: Right lung Other Cancer Therapies: Talc pleurodesis for recurrent pleural effusion on 01/20/2022. EXAM: CT CHEST, ABDOMEN, AND PELVIS WITH CONTRAST TECHNIQUE: Multidetector CT imaging of the chest, abdomen and pelvis was performed following the standard protocol during bolus administration of intravenous contrast. RADIATION DOSE REDUCTION: This exam was performed according to the departmental dose-optimization program which includes automated exposure control, adjustment of the mA and/or kV according to patient size and/or use of iterative reconstruction technique. CONTRAST:  167mL OMNIPAQUE IOHEXOL 300 MG/ML  SOLN COMPARISON:  Most recent CT chest, abdomen and pelvis 11/27/2021. 03/28/2020 PET-CT. FINDINGS: CT CHEST FINDINGS Cardiovascular: Calcified and noncalcified atheromatous plaque in the thoracic aorta. No aneurysmal dilation. Normal heart size. No substantial pericardial effusion. Central pulmonary vasculature is unremarkable on venous phase. Mediastinum/Nodes: RIGHT-sided Port-A-Cath terminates at the caval to atrial junction. No signs of adenopathy in the chest with stable subtle soft tissue about the low RIGHT paratracheal chain tracking anterior to RIGHT mainstem bronchus measuring 14 mm. Scattered small lymph nodes throughout the remainder of the chest without change Lungs/Pleura: Slight decrease in a small RIGHT-sided effusion. Pleural nodularity over the RIGHT hemidiaphragm in the lateral and inferior costodiaphragmatic sulcus (image 52/2) rind of pleural nodularity measuring 6 mm, quite subtle  perhaps slightly increase from previous imaging best seen in coronal difficult to assess as a discrete measurable lesion. Stable appearance of volume loss and bronchiectatic changes extending from the hilum into the RIGHT upper lobe. Stable nodular area in the RIGHT upper lobe (image 135/6) 11 mm Airways LEFT chest are patent. No suspicious nodules in the LEFT chest. Musculoskeletal: No acute findings about the bony thorax. Spinal degenerative changes. No destructive bone process. See below for full musculoskeletal details. CT ABDOMEN PELVIS  FINDINGS Hepatobiliary: Hepatic metastatic lesions show some increased size. Lesion along the anterior RIGHT hemi liver (image 60/2) 22 mm as compared to 16 mm. (Image 56/2) 19 mm caudate lesion previously 13 mm. (Image 55/2) low-density lesion inferolateral segment LEFT hepatic lobe 8 mm as compared to 13 mm, perhaps slightly diminished in size. (Image 48/2) 22 mm hepatic subsegment VII/VIII lesion near the dome of the RIGHT hemi liver previously approximately 7 mm. Portal vein is patent. No pericholecystic stranding or biliary duct distension. Pancreas: Normal, without mass, inflammation or ductal dilatation. Spleen: Normal. Adrenals/Urinary Tract: RIGHT adrenal nodule is stable measuring 12-13 mm. Symmetric renal enhancement. No sign of hydronephrosis. No suspicious renal lesion or perinephric stranding. Urinary bladder is grossly unremarkable. Cysts about the RIGHT kidney without change, dominant cyst measuring up to 6.9 x 5.6 cm extends partially into the renal sinus. Stomach/Bowel: No signs of bowel inflammation or focal bowel wall thickening. Moderate duodenal diverticulum with similar appearance. Normal appendix. Formed stool throughout much of the colon without substantial distension. Vascular/Lymphatic: Aortic atherosclerosis. No sign of aneurysm. Smooth contour of the IVC. Periportal lymph node with enlargement (image 60/2) 19 mm short axis previously 12 mm. No  hepatic gastric adenopathy or additional adenopathy in the abdomen. No pelvic sidewall lymphadenopathy. Reproductive: Moderate prostatomegaly, essentially unremarkable by CT. Other: No ascites. Musculoskeletal: No acute bone finding. No destructive bone process. Spinal degenerative changes. IMPRESSION: 1. Slight decrease in a small RIGHT-sided effusion. 2. Pleural nodularity over the RIGHT hemidiaphragm in the lateral and inferior costodiaphragmatic sulcus, quite subtle perhaps slightly increase from previous imaging. Attention on follow-up. 3. Stable appearance of volume loss and bronchiectatic changes extending from the hilum into the RIGHT upper lobe. 4. Increased size of hepatic metastatic lesions. 5. Increasing size of a periportal lymph node suspicious for nodal involvement in the abdomen. 6. Stable RIGHT adrenal nodule. 7. Aortic atherosclerosis. Aortic Atherosclerosis (ICD10-I70.0). Electronically Signed   By: Zetta Bills M.D.   On: 02/07/2022 10:42   CT Abdomen Pelvis W Contrast  Result Date: 02/07/2022 CLINICAL DATA:  Primary Cancer Type: Lung Imaging Indication: Assess response to therapy Interval therapy since last imaging? Yes Initial Cancer Diagnosis Date: 04/09/2020; Established by: Biopsy-proven Detailed Pathology: Extensive stage small cell lung cancer. Primary Tumor location:  Right upper lobe. Metastatic liver lesions. Surgeries: No. Chemotherapy: Yes; Ongoing? Yes; Zepzelca; Most recent administration: 01/23/2022 Immunotherapy?  Yes; Type: Imfinzi, Tecentriq; Ongoing? No Radiation therapy? Yes; Date Range: 04/12/2020 - 05/02/2020; Target: Right lung Other Cancer Therapies: Talc pleurodesis for recurrent pleural effusion on 01/20/2022. EXAM: CT CHEST, ABDOMEN, AND PELVIS WITH CONTRAST TECHNIQUE: Multidetector CT imaging of the chest, abdomen and pelvis was performed following the standard protocol during bolus administration of intravenous contrast. RADIATION DOSE REDUCTION: This exam was  performed according to the departmental dose-optimization program which includes automated exposure control, adjustment of the mA and/or kV according to patient size and/or use of iterative reconstruction technique. CONTRAST:  123mL OMNIPAQUE IOHEXOL 300 MG/ML  SOLN COMPARISON:  Most recent CT chest, abdomen and pelvis 11/27/2021. 03/28/2020 PET-CT. FINDINGS: CT CHEST FINDINGS Cardiovascular: Calcified and noncalcified atheromatous plaque in the thoracic aorta. No aneurysmal dilation. Normal heart size. No substantial pericardial effusion. Central pulmonary vasculature is unremarkable on venous phase. Mediastinum/Nodes: RIGHT-sided Port-A-Cath terminates at the caval to atrial junction. No signs of adenopathy in the chest with stable subtle soft tissue about the low RIGHT paratracheal chain tracking anterior to RIGHT mainstem bronchus measuring 14 mm. Scattered small lymph nodes throughout the remainder of the chest  without change Lungs/Pleura: Slight decrease in a small RIGHT-sided effusion. Pleural nodularity over the RIGHT hemidiaphragm in the lateral and inferior costodiaphragmatic sulcus (image 52/2) rind of pleural nodularity measuring 6 mm, quite subtle perhaps slightly increase from previous imaging best seen in coronal difficult to assess as a discrete measurable lesion. Stable appearance of volume loss and bronchiectatic changes extending from the hilum into the RIGHT upper lobe. Stable nodular area in the RIGHT upper lobe (image 135/6) 11 mm Airways LEFT chest are patent. No suspicious nodules in the LEFT chest. Musculoskeletal: No acute findings about the bony thorax. Spinal degenerative changes. No destructive bone process. See below for full musculoskeletal details. CT ABDOMEN PELVIS FINDINGS Hepatobiliary: Hepatic metastatic lesions show some increased size. Lesion along the anterior RIGHT hemi liver (image 60/2) 22 mm as compared to 16 mm. (Image 56/2) 19 mm caudate lesion previously 13 mm. (Image  55/2) low-density lesion inferolateral segment LEFT hepatic lobe 8 mm as compared to 13 mm, perhaps slightly diminished in size. (Image 48/2) 22 mm hepatic subsegment VII/VIII lesion near the dome of the RIGHT hemi liver previously approximately 7 mm. Portal vein is patent. No pericholecystic stranding or biliary duct distension. Pancreas: Normal, without mass, inflammation or ductal dilatation. Spleen: Normal. Adrenals/Urinary Tract: RIGHT adrenal nodule is stable measuring 12-13 mm. Symmetric renal enhancement. No sign of hydronephrosis. No suspicious renal lesion or perinephric stranding. Urinary bladder is grossly unremarkable. Cysts about the RIGHT kidney without change, dominant cyst measuring up to 6.9 x 5.6 cm extends partially into the renal sinus. Stomach/Bowel: No signs of bowel inflammation or focal bowel wall thickening. Moderate duodenal diverticulum with similar appearance. Normal appendix. Formed stool throughout much of the colon without substantial distension. Vascular/Lymphatic: Aortic atherosclerosis. No sign of aneurysm. Smooth contour of the IVC. Periportal lymph node with enlargement (image 60/2) 19 mm short axis previously 12 mm. No hepatic gastric adenopathy or additional adenopathy in the abdomen. No pelvic sidewall lymphadenopathy. Reproductive: Moderate prostatomegaly, essentially unremarkable by CT. Other: No ascites. Musculoskeletal: No acute bone finding. No destructive bone process. Spinal degenerative changes. IMPRESSION: 1. Slight decrease in a small RIGHT-sided effusion. 2. Pleural nodularity over the RIGHT hemidiaphragm in the lateral and inferior costodiaphragmatic sulcus, quite subtle perhaps slightly increase from previous imaging. Attention on follow-up. 3. Stable appearance of volume loss and bronchiectatic changes extending from the hilum into the RIGHT upper lobe. 4. Increased size of hepatic metastatic lesions. 5. Increasing size of a periportal lymph node suspicious for  nodal involvement in the abdomen. 6. Stable RIGHT adrenal nodule. 7. Aortic atherosclerosis. Aortic Atherosclerosis (ICD10-I70.0). Electronically Signed   By: Zetta Bills M.D.   On: 02/07/2022 10:42     ASSESSMENT/PLAN:  This is a very pleasant 71 year old Caucasian male diagnosed with extensive stage (T4, N3, M1C) small cell lung cancer presented with large right upper lobe lung mass in addition to mediastinal and right supraclavicular lymphadenopathy and multiple metastatic liver lesions diagnosed in April 2021.   The patient initially underwent systemic chemotherapy with carboplatin for an AUC of 5 on day 1, etoposide 100 mg per metered squared on days 1, 2, and 3 in addition to [facility] for myeloprotection.  He also received immunotherapy with Imfinzi on day 1 of every chemotherapy cycle.  He is status post 8 cycles.  Starting from cycle #5 he was on single agent immunotherapy with Imfinzi IV every 4 weeks   The patient had evidence of disease progression on his scan from December 2021.   He  was then started on systemic chemotherapy with carboplatin for an AUC of 5 on day 1, etoposide 100 mg per metered squared on days 1, 2, and 3 in addition to Tecentriq 1200 mg on day 1, the patient is status post 8 cycles.  Starting from cycle #5, the patient started maintenance immunotherapy with Tecentriq.  This was discontinued due to evidence of disease progression.  The patient is currently undergoing palliative systemic chemotherapy with Zepzelca IV every 3 weeks.  He is status post his 12 cycles and he tolerated it well except for fatigue a few days after treatment.    The patient recently had a restaging CT scan performed.  Dr. Julien Nordmann personally and independently reviewed the scan and discussed the results with the patient today.  The scan showed increase in size of hepatic metastatic lesions, and increased size in the periportal lymph node suspicious for nodal involvement in the abdomen.  Dr.  Julien Nordmann had a lengthy discussion with the patient today about his current condition and recommended treatment options.  Dr. Julien Nordmann gave the patient the option of referral to palliative care/hospice versus changing treatment to Irinotecan 65 mg per metered squared on days 1 and 8 IV every 3 weeks.   The patient is interested in this option and he is expected to undergo the first cycle of this treatment on 02/18/22  The adverse side effects of treatment were discussed including but not limited to diarrhea, nausea, vomiting, fatigue, myelosuppression, kidney, and liver dysfunction.   We will see the patient back for follow-up visit in 2 weeks for evaluation before starting day 8 cycle 1.   He will continue taking his blood thinner for his history of pulmonary embolism.  The patient continues to have a right pleural effusion.  The patient is not symptomatic at this time and he would like to continue to monitor for now.  He knows how to reach [place] if he would like [place] to arrange for thoracentesis.  He is also scheduled to meet with Dr. Valeta Harms next week on 02/19/22.   The patient was advised to call immediately if he has any concerning symptoms in the interval. The patient voices understanding of current disease status and treatment options and is in agreement with the current care plan. All questions were answered. The patient knows to call the clinic with any problems, questions or concerns. We can certainly see the patient much sooner if necessary  No orders of the defined types were placed in this encounter.     Thadius Smisek L Moira Umholtz, PA-C 02/11/22  ADDENDUM: Hematology/Oncology Attending: I had a face-to-face encounter with the patient today.  I reviewed his record, lab, scan and recommended his care plan.  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 The patient received second line treatment with chemotherapy again with carboplatin, etoposide and Tecentriq with concern status post 4 cycles followed by 4 more cycles of maintenance Tecentriq.  This treatment was discontinued secondary to disease progression. The patient started third line treatment with Zepzelca (lurbinectedin) 3.2 Mg/M2 every 3-week status post 12 cycles of this treatment and he has been tolerating it fairly well except for fatigue. He had repeat CT scan of the chest, abdomen pelvis performed recently.  I personally and independently reviewed  the scan images and discussed the results with the patient and his wife today.  Unfortunately his scan showed evidence for disease progression especially in the liver as well as periportal lymph node. I recommended for the patient to discontinue his treatment with Zepzelca (lurbinectedin) at this point. I discussed with the patient other treatment options. I explained to the patient that the other FDA approved therapy is treatment with topotecan but this will be toxic for him especially with myelosuppression and other adverse effects. I discussed with the patient alternative options including treatment with irinotecan 65 Mg/M2 on days 1 and 8 every 3 weeks versus palliative care and hospice referral.  The patient is interested in proceeding with systemic treatment with irinotecan and he is expected to start the first cycle of this treatment next week. I discussed with him the adverse effect of this treatment including but not limited to alopecia, myelosuppression, nausea and vomiting, diarrhea, peripheral neuropathy, liver or renal dysfunction. He will come back for follow-up visit in 2 weeks for evaluation with day 8 of the first cycle. For the right pleural effusion, he is currently asymptomatic but he will call if he  becomes more short of breath and we will consider referring the patient for ultrasound-guided thoracentesis if needed. The patient was advised to call immediately if he has any other concerning symptoms in the interval. The total time spent in the appointment was 35 minutes. Disclaimer: This note was dictated with voice recognition software. Similar sounding words can inadvertently be transcribed and may be missed upon review. Eilleen Kempf, MD

## 2022-02-10 MED FILL — Dexamethasone Sodium Phosphate Inj 100 MG/10ML: INTRAMUSCULAR | Qty: 1 | Status: AC

## 2022-02-11 ENCOUNTER — Telehealth: Payer: Self-pay | Admitting: Internal Medicine

## 2022-02-11 ENCOUNTER — Inpatient Hospital Stay: Payer: Medicare Other

## 2022-02-11 ENCOUNTER — Other Ambulatory Visit: Payer: Self-pay | Admitting: Internal Medicine

## 2022-02-11 ENCOUNTER — Other Ambulatory Visit: Payer: Self-pay

## 2022-02-11 ENCOUNTER — Inpatient Hospital Stay (HOSPITAL_BASED_OUTPATIENT_CLINIC_OR_DEPARTMENT_OTHER): Payer: Medicare Other | Admitting: Physician Assistant

## 2022-02-11 VITALS — BP 138/75 | HR 84 | Temp 97.5°F | Resp 19 | Ht 69.0 in | Wt 287.4 lb

## 2022-02-11 DIAGNOSIS — C787 Secondary malignant neoplasm of liver and intrahepatic bile duct: Secondary | ICD-10-CM | POA: Diagnosis not present

## 2022-02-11 DIAGNOSIS — Z95828 Presence of other vascular implants and grafts: Secondary | ICD-10-CM

## 2022-02-11 DIAGNOSIS — C3411 Malignant neoplasm of upper lobe, right bronchus or lung: Secondary | ICD-10-CM

## 2022-02-11 DIAGNOSIS — Z7901 Long term (current) use of anticoagulants: Secondary | ICD-10-CM | POA: Diagnosis not present

## 2022-02-11 DIAGNOSIS — Z86711 Personal history of pulmonary embolism: Secondary | ICD-10-CM | POA: Diagnosis not present

## 2022-02-11 DIAGNOSIS — Z7189 Other specified counseling: Secondary | ICD-10-CM

## 2022-02-11 DIAGNOSIS — J90 Pleural effusion, not elsewhere classified: Secondary | ICD-10-CM | POA: Diagnosis not present

## 2022-02-11 DIAGNOSIS — Z5111 Encounter for antineoplastic chemotherapy: Secondary | ICD-10-CM | POA: Diagnosis not present

## 2022-02-11 LAB — CBC WITH DIFFERENTIAL (CANCER CENTER ONLY)
Abs Immature Granulocytes: 0.07 10*3/uL (ref 0.00–0.07)
Basophils Absolute: 0 10*3/uL (ref 0.0–0.1)
Basophils Relative: 1 %
Eosinophils Absolute: 0.1 10*3/uL (ref 0.0–0.5)
Eosinophils Relative: 3 %
HCT: 31.7 % — ABNORMAL LOW (ref 39.0–52.0)
Hemoglobin: 10.4 g/dL — ABNORMAL LOW (ref 13.0–17.0)
Immature Granulocytes: 2 %
Lymphocytes Relative: 19 %
Lymphs Abs: 0.6 10*3/uL — ABNORMAL LOW (ref 0.7–4.0)
MCH: 29.2 pg (ref 26.0–34.0)
MCHC: 32.8 g/dL (ref 30.0–36.0)
MCV: 89 fL (ref 80.0–100.0)
Monocytes Absolute: 0.6 10*3/uL (ref 0.1–1.0)
Monocytes Relative: 19 %
Neutro Abs: 1.6 10*3/uL — ABNORMAL LOW (ref 1.7–7.7)
Neutrophils Relative %: 56 %
Platelet Count: 234 10*3/uL (ref 150–400)
RBC: 3.56 MIL/uL — ABNORMAL LOW (ref 4.22–5.81)
RDW: 15.8 % — ABNORMAL HIGH (ref 11.5–15.5)
WBC Count: 3 10*3/uL — ABNORMAL LOW (ref 4.0–10.5)
nRBC: 1 % — ABNORMAL HIGH (ref 0.0–0.2)

## 2022-02-11 LAB — CMP (CANCER CENTER ONLY)
ALT: 15 U/L (ref 0–44)
AST: 16 U/L (ref 15–41)
Albumin: 3.7 g/dL (ref 3.5–5.0)
Alkaline Phosphatase: 60 U/L (ref 38–126)
Anion gap: 6 (ref 5–15)
BUN: 17 mg/dL (ref 8–23)
CO2: 29 mmol/L (ref 22–32)
Calcium: 8.9 mg/dL (ref 8.9–10.3)
Chloride: 105 mmol/L (ref 98–111)
Creatinine: 1.17 mg/dL (ref 0.61–1.24)
GFR, Estimated: >60 mL/min (ref >60)
Glucose, Bld: 188 mg/dL — ABNORMAL HIGH (ref 70–99)
Potassium: 4.1 mmol/L (ref 3.5–5.1)
Sodium: 140 mmol/L (ref 135–145)
Total Bilirubin: 0.4 mg/dL (ref 0.3–1.2)
Total Protein: 6.3 g/dL — ABNORMAL LOW (ref 6.5–8.1)

## 2022-02-11 MED ORDER — SODIUM CHLORIDE 0.9% FLUSH
10.0000 mL | Freq: Once | INTRAVENOUS | Status: AC
  Administered 2022-02-11: 10 mL

## 2022-02-11 MED ORDER — HEPARIN SOD (PORK) LOCK FLUSH 100 UNIT/ML IV SOLN
500.0000 [IU] | Freq: Once | INTRAVENOUS | Status: AC
  Administered 2022-02-11: 500 [IU]

## 2022-02-11 NOTE — Progress Notes (Signed)
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   [id number]:Lurbinectedin 3.2 mg/m2 IV D1 q21 Days:   A cycle is every 21 days:     Lurbinectedin   **Always confirm dose/schedule in your pharmacy ordering system**  REASON: Disease Progression PRIOR TREATMENT: Off Pathway: Lurbinectedin 3.2 mg/m2 IV D1 q21 Days TREATMENT RESPONSE: Partial Response (PR)    Patient Characteristics: Relapsed or Progressive Disease, Third Line and Beyond Therapeutic Status: Relapsed or Progressive Disease Line of Therapy: Third [employer] and [employer]

## 2022-02-11 NOTE — Telephone Encounter (Signed)
Scheduled per 02/28 los, patient has been called and notified of upcoming appointments.

## 2022-02-11 NOTE — Progress Notes (Signed)
Per Cassandra Heilingoetter, PA-C - patient will not be receiving treatment today. Patient sent to infusion for port de-access.  Infusion RN made aware.

## 2022-02-11 NOTE — Patient Instructions (Signed)

## 2022-02-13 NOTE — Progress Notes (Signed)
Subjective:   Adam Wise is a 71 y.o. male who presents for Medicare Annual/Subsequent preventive examination.  I connected with Adam Wise today by telephone and verified that I am speaking with the correct person using two identifiers. Location patient: home Location provider: work Persons participating in the virtual visit: patient, Engineer, civil (consulting).    I discussed the limitations, risks, security and privacy concerns of performing an evaluation and management service by telephone and the availability of in person appointments. I also discussed with the patient that there may be a patient responsible charge related to this service. The patient expressed understanding and verbally consented to this telephonic visit.    Interactive audio and video telecommunications were attempted between this provider and patient, however failed, due to patient having technical difficulties OR patient did not have access to video capability.  We continued and completed visit with audio only.  Some vital signs may be absent or patient reported.   Time Spent with patient on telephone encounter: 20 minutes  Review of Systems     Cardiac Risk Factors include: advanced age (>68men, >84 women);diabetes mellitus;hypertension;dyslipidemia     Objective:    Today's Vitals   02/14/22 1447  Weight: 287 lb (130.2 kg)  Height: 5\' 9"  (1.753 m)   Body mass index is 42.38 kg/m.  Advanced Directives 02/14/2022 01/20/2022 12/31/2021 12/11/2021 11/19/2021 10/11/2021 10/08/2021  Does Patient Have a Medical Advance Directive? Yes Yes Yes Yes Yes Yes Yes  Type of 10/10/2021 of [place];Living will - - Healthcare Power of [place];Living will Healthcare Power of [place];Living will Healthcare Power of [place];Living will  Does patient want to make changes to medical advance directive? Yes (MAU/Ambulatory/Procedural Areas - Information given) - - No - Patient declined No -  Patient declined No - Patient declined -  Copy of Healthcare Power of Attorney in Chart? Yes - validated most recent copy scanned in chart (See row information) - - No - copy requested No - copy requested No - copy requested -  Would patient like information on creating a medical advance directive? - - - No - Patient declined - No - Patient declined No - Patient declined    Current Medications (verified) Outpatient Encounter Medications as of 02/14/2022  Medication Sig   albuterol (VENTOLIN HFA) 108 (90 Base) MCG/ACT inhaler Inhale 2 puffs into the lungs every 6 (six) hours as needed for wheezing or shortness of breath.   amLODipine (NORVASC) 10 MG tablet TAKE 1 TABLET BY MOUTH EVERY DAY FOR BLOOD PRESSURE   apixaban (ELIQUIS) 5 MG TABS tablet Take 1 tablet (5 mg total) by mouth 2 (two) times daily.   bisoprolol (ZEBETA) 5 MG tablet Take 1 tablet (5 mg total) by mouth daily.   Black Pepper-Turmeric (TURMERIC COMPLEX/BLACK PEPPER PO) Take 1 tablet by mouth in the morning and at bedtime.   blood glucose meter kit and supplies KIT Dispense based on patient and insurance preference. Use up to four times daily as directed.   chlorpheniramine-HYDROcodone (TUSSIONEX) 10-8 MG/5ML SUER Take 5 mLs by mouth at bedtime as needed for cough.   esomeprazole (NEXIUM) 20 MG capsule Take 20 mg by mouth in the morning.   gabapentin (NEURONTIN) 300 MG capsule Take 300 mg by mouth at bedtime.   glipiZIDE (GLUCOTROL) 10 MG tablet TAKE 1 TABLET (10 MG TOTAL) BY MOUTH 2 (TWO) TIMES DAILY BEFORE A MEAL. FOR DIABETES.   Insulin Glargine (BASAGLAR KWIKPEN) 100 UNIT/ML Inject 20 Units into the  skin at bedtime. For diabetes.   Insulin Pen Needle (BD PEN NEEDLE NANO U/F) 32G X 4 MM MISC Use with insulin as prescribed  Dx Code: E11.9   ipratropium-albuterol (DUONEB) 0.5-2.5 (3) MG/3ML SOLN Take 3 mLs by nebulization every 4 (four) hours while awake for 3 days, THEN 3 mLs every 4 (four) hours as needed (shortness of breath or  wheezing).   Lancets MISC USE UP TO 3 TIMES DAILY AS DIRECTED   losartan (COZAAR) 50 MG tablet Take 1 tablet (50 mg total) by mouth daily. For blood pressure.   Melatonin 10 MG CAPS Take 10 mg by mouth at bedtime.   metFORMIN (GLUCOPHAGE) 1000 MG tablet TAKE 1 TABLET (1,000 MG TOTAL) BY MOUTH 2 (TWO) TIMES DAILY WITH A MEAL. FOR DIABETES   Multiple Vitamin (MULTI-VITAMINS) TABS Take 1 tablet by mouth in the morning.   Omega-3 Fatty Acids (SALMON OIL PO) Take 1 capsule by mouth daily.   ONETOUCH ULTRA test strip USE UP TO 4 TIMES DAILY AS DIRECTED   oxymetazoline (AFRIN) 0.05 % nasal spray Place 1 spray into both nostrils daily.   pioglitazone (ACTOS) 45 MG tablet TAKE 1 TABLET (45 MG TOTAL) BY MOUTH DAILY. FOR DIABETES.   pravastatin (PRAVACHOL) 40 MG tablet TAKE 1 TABLET BY MOUTH EVERY DAY IN THE EVENING FOR CHOLESTEROL   testosterone cypionate (DEPOTESTOSTERONE CYPIONATE) 200 MG/ML injection Inject 200 mg into the muscle every 14 (fourteen) days.   B-D 3CC LUER-LOK SYR 22GX1" 22G X 1" 3 ML MISC USE AS INSTRUCTED FOR TESTOSTERONE INJECTION EVERY 2 WEEKS   oxyCODONE-acetaminophen (PERCOCET) 5-325 MG tablet Take 1-2 tablets by mouth every 4 (four) hours as needed for severe pain. (Patient not taking: Reported on 02/14/2022)   No facility-administered encounter medications on file as of 02/14/2022.    Allergies (verified) Bupropion and Hydrochlorothiazide   History: Past Medical History:  Diagnosis Date   Chickenpox    Chronic knee pain    Chronic low back pain    Coronary artery calcification seen on CAT scan 11/2021   Coronary CTA 11/27/2021: Moderate to large right pleural effusion and compressive atelectasis right lung base. => Coronary Calcium Score 657.  Diffuse RCA calcification.  Minimal mild disease in the LAD and diagonal branches. == Overall limited study.  Notable artifact.   Essential hypertension    GERD (gastroesophageal reflux disease)    Hyperlipidemia    OSA (obstructive  sleep apnea)    With nighttime oxygen supplementation   T4, M3, M1 C Metastatic Small Cell Lung Cancer 03/2020   large right upper lobe/right hilar mass with ipsilateral and contralateral mediastinal and right supraclavicular lymphadenopathy in addition to multiple liver lesios. He has disease progression after the first line of chemotherapy in December 2021.   Testosterone deficiency    Type 2 diabetes mellitus ([place])    Past Surgical History:  Procedure Laterality Date   CHEST TUBE INSERTION Right 01/20/2022   Procedure: CHEST TUBE INSERTION;  Surgeon: Candee Furbish, MD;  Location: [facility] ENDOSCOPY;  Service: Pulmonary;  Laterality: Right;  w/ Talc Pleurodesis, planned admit for Obs afterwards   COLONOSCOPY WITH PROPOFOL N/A 12/17/2018   Procedure: COLONOSCOPY WITH PROPOFOL;  Surgeon: Virgel Manifold, MD;  Location: [facility] ENDOSCOPY;  Service: Gastroenterology;  Laterality: N/A;   IR IMAGING GUIDED PORT INSERTION  04/17/2020   IR THORACENTESIS ASP PLEURAL SPACE W/IMG GUIDE  11/29/2021   IR THORACENTESIS ASP PLEURAL SPACE W/IMG GUIDE  01/03/2022   JOINT REPLACEMENT Bilateral  REPLACEMENT TOTAL KNEE BILATERAL  2015   TONSILLECTOMY  1960   TRANSTHORACIC ECHOCARDIOGRAM  05/2020   a) 05/2020: EF 55 to 60%.  No R WMA.  Mild LVH.  Indeterminate LVEDP.  Unable to assess RVP.  Normal aortic and mitral valves.  Mildly elevated RAP.; b) 09/2021: EF 50-55%. No RWMA. Mild LVH. ~ LVEDP. Mild LA Dilation. NORMAL RV/RAP.  Normal MV/AoV.   Family History  Problem Relation Age of Onset   Heart attack Mother    Cancer Mother    Hypertension Mother    Arthritis Father    Asthma Father    Cancer Father    COPD Father    Heart attack Father    Hypertension Sister    Cancer Sister    Diabetes Sister    Asthma Sister    Cancer Sister    COPD Sister    Arthritis Sister    Asthma Sister    Diabetes Sister    Birth defects Maternal Grandfather    Arthritis Paternal Grandmother    Diabetes Paternal  Grandmother    Arthritis Paternal Grandfather    Asthma Son    Other Neg Hx        pituitary disorder   Social History   Socioeconomic History   Marital status: Married    Spouse name: Not on file   Number of children: Not on file   Years of education: Not on file   Highest education level: Not on file  Occupational History   Not on file  Tobacco Use   Smoking status: Former   Smokeless tobacco: Never  Vaping Use   Vaping Use: Never used  Substance and Sexual Activity   Alcohol use: Not Currently    Comment: rarely   Drug use: Not Currently   Sexual activity: Yes  Other Topics Concern   Not on file  Social History Narrative   Married.   Moved from [place].   Retired.   Social Determinants of Health   Financial Resource Strain: Low Risk    Difficulty of Paying Living Expenses: Not hard at all  Food Insecurity: No Food Insecurity   Worried About Charity fundraiser in the Last Year: Never true   [place] in the Last Year: Never true  Transportation Needs: No Transportation Needs   Lack of Transportation (Medical): No   Lack of Transportation (Non-Medical): No  Physical Activity: Inactive   Days of Exercise per Week: 0 days   Minutes of Exercise per Session: 0 min  Stress: No Stress Concern Present   Feeling of Stress : Only a little  Social Connections: Moderately Integrated   Frequency of Communication with Friends and Family: Twice a week   Frequency of Social Gatherings with Friends and Family: Twice a week   Attends Religious Services: 1 to 4 times per year   Active Member of [employer] or Organizations: No   Attends Music therapist: Never   Marital Status: Married    Tobacco Counseling Counseling given: Not Answered   Clinical Intake:  Pre-visit preparation completed: Yes  Pain : No/denies pain     BMI - recorded: 42.38 Nutritional Status: BMI > 30  Obese Nutritional Risks: None Diabetes: Yes CBG done?: No Did pt. bring in CBG  monitor from home?: No  How often do you need to have someone help you when you read instructions, pamphlets, or other written materials from your doctor or pharmacy?: 1 - Never  Diabetes:  Is the patient diabetic?  Yes  If diabetic, was a CBG obtained today?  No  Did the patient bring in their glucometer from home?  No  How often do you monitor your CBG's? 2 times per day .   Financial Strains and Diabetes Management:  Are you having any financial strains with the device, your supplies or your medication? No .  Does the patient want to be seen by Chronic Care Management for management of their diabetes?  No  Would the patient like to be referred to a Nutritionist or for Diabetic Management?  No   Diabetic Exams:  Diabetic Eye Exam:  Overdue for diabetic eye exam. Pt plans on scheduling an appointment.   Diabetic Foot Exam: Completed 08/13/21.   Interpreter Needed?: No  Information entered by :: Orrin Brigham LPN   Activities of Daily Living In your present state of health, do you have any difficulty performing the following activities: 02/14/2022 10/11/2021  Hearing? N N  Vision? N N  Difficulty concentrating or making decisions? N N  Walking or climbing stairs? N Y  Comment - secondary to shortness of breath and wheezing  Dressing or bathing? N N  Doing errands, shopping? N N  Preparing Food and eating ? N -  Using the Toilet? N -  In the past six months, have you accidently leaked urine? N -  Do you have problems with loss of bowel control? N -  Managing your Medications? N -  Managing your Finances? N -  Housekeeping or managing your Housekeeping? N -  Some recent data might be hidden    Patient Care Team: [name], NP as PCP - General (Internal Medicine) Leonie Man, MD as PCP - Cardiology (Cardiology) Valrie Hart, RN as Oncology Nurse Navigator  Indicate any recent Medical Services you may have received from other than [facility] providers in the  past year (date may be approximate).     Assessment:   This is a routine wellness examination for [place].  Hearing/Vision screen Hearing Screening - Comments:: No issues Vision Screening - Comments:: Last exam over a year ago, My eyedoctor  Dietary issues and exercise activities discussed: Current Exercise Habits: The patient does not participate in regular exercise at present   Goals Addressed             This Visit's Progress    Patient Stated       Would like to maintain current routine        Depression Screen PHQ 2/9 Scores 02/14/2022 08/13/2021 02/13/2021 01/28/2021 11/01/2019 10/27/2018  PHQ - 2 Score 0 0 0 0 0 0  PHQ- 9 Score - 0 0 2 0 0    Fall Risk Fall Risk  02/14/2022 02/13/2021 01/28/2021 11/01/2019 10/27/2018  Falls in the past year? 0 0 0 0 0  Number falls in past yr: 0 0 0 0 -  Injury with Fall? 0 0 0 0 -  Risk for fall due to : No Fall Risks Medication side effect - Medication side effect -  Follow up Falls prevention discussed Falls evaluation completed;Falls prevention discussed - Falls evaluation completed;Falls prevention discussed -    FALL RISK PREVENTION PERTAINING TO THE HOME:  Any stairs in or around the home? Yes  If so, are there any without handrails? No  Home free of loose throw rugs in walkways, pet beds, electrical cords, etc? Yes  Adequate lighting in your home to reduce risk of falls? Yes  ASSISTIVE DEVICES UTILIZED TO PREVENT FALLS:  Life alert? No  Use of a cane, walker or w/c? No  Grab bars in the bathroom? Yes  Shower chair or bench in shower? Yes  Elevated toilet seat or a handicapped toilet? Yes   TIMED UP AND GO:  Was the test performed? No .    Cognitive Function: Normal cognitive status assessed by this Nurse Health Advisor. No abnormalities found.   MMSE - Mini Mental State Exam 02/13/2021 11/01/2019 10/27/2018  Not completed: Refused - -  Orientation to time - 5 5  Orientation to Place - 5 5  Registration - 3 3   Attention/ Calculation - 5 0  Recall - 3 3  Language- name 2 objects - - 0  Language- repeat - 1 1  Language- follow 3 step command - - 3  Language- read & follow direction - - 0  Write a sentence - - 0  Copy design - - 0  Total score - - 20        Immunizations Immunization History  Administered Date(s) Administered   Fluad Quad(high Dose 65+) 10/26/2020, 08/13/2021   Influenza Inj Mdck Quad Pf 09/18/2017   Influenza,inj,Quad PF,6+ Mos 10/27/2018, 11/08/2019   Influenza-Unspecified 09/18/2017   PFIZER(Purple Top)SARS-COV-2 Vaccination 03/12/2020, 04/02/2020, 12/11/2020   Pneumococcal Conjugate-13 08/17/2015   Pneumococcal Polysaccharide-23 11/25/2012, 10/27/2018   Td 08/27/2016    TDAP status: Up to date  Flu Vaccine status: Up to date  Pneumococcal vaccine status: Up to date  Covid-19 vaccine status: Declined, Education has been provided regarding the importance of this vaccine but patient still declined. Advised may receive this vaccine at local pharmacy or Health Dept.or vaccine clinic. Aware to provide a copy of the vaccination record if obtained from local pharmacy or Health Dept. Verbalized acceptance and understanding.  Qualifies for Shingles Vaccine? Yes   Zostavax completed No   Shingrix Completed?: No.    Education has been provided regarding the importance of this vaccine. Patient has been advised to call insurance company to determine out of pocket expense if they have not yet received this vaccine. Advised may also receive vaccine at local pharmacy or Health Dept. Verbalized acceptance and understanding.  Screening Tests Health Maintenance  Topic Date Due   COLONOSCOPY (Pts 45-42yrs Insurance coverage will need to be confirmed)  12/17/2021   Zoster Vaccines- Shingrix (1 of 2) 03/18/2022 (Originally 07/02/1970)   OPHTHALMOLOGY EXAM  06/17/2022 (Originally 11/15/2019)   HEMOGLOBIN A1C  06/17/2022   FOOT EXAM  08/13/2022   TETANUS/TDAP  08/27/2026   Pneumonia  Vaccine 33+ Years old  Completed   INFLUENZA VACCINE  Completed   Hepatitis C Screening  Completed   HPV VACCINES  Aged Out   COVID-19 Vaccine  Discontinued    Health Maintenance  Health Maintenance Due  Topic Date Due   COLONOSCOPY (Pts 45-23yrs Insurance coverage will need to be confirmed)  12/17/2021    Colorectal cancer screening: patient plans on discussing with PCP when decided  Lung Cancer Screening: (Low Dose CT Chest recommended if Age 43-80 years, 30 pack-year currently smoking OR have quit w/in 15years.)  Does qualify, patient currently seeing oncology for lung cancer.    Additional Screening:  Hepatitis C Screening: does qualify; Completed 10/27/18  Vision Screening: Recommended annual ophthalmology exams for early detection of glaucoma and other disorders of the eye. Is the patient up to date with their annual eye exam?  No  Who is the provider or what is the name of  the office in which the patient attends annual eye exams? My eye doctor   Dental Screening: Recommended annual dental exams for proper oral hygiene  Community Resource Referral / Chronic Care Management: CRR required this visit?  No   CCM required this visit?  No      Plan:     I have personally reviewed and noted the following in the patients chart:   Medical and social history Use of alcohol, tobacco or illicit drugs  Current medications and supplements including opioid prescriptions. Patient is currently taking opioid prescriptions. Information provided to patient regarding non-opioid alternatives. Patient advised to discuss non-opioid treatment plan with their provider. Functional ability and status Nutritional status Physical activity Advanced directives List of other physicians Hospitalizations, surgeries, and ER visits in previous 12 months Vitals Screenings to include cognitive, depression, and falls Referrals and appointments  In addition, I have reviewed and discussed with  patient certain preventive protocols, quality metrics, and best practice recommendations. A written personalized care plan for preventive services as well as general preventive health recommendations were provided to patient.   Due to this being a telephonic visit, the after visit summary with patients personalized plan was offered to patient via mail or my-chart. Patient would like to access on my-chart.   Loma Messing, LPN   02/16/6700   Nurse Health Advisor  Nurse Notes: none

## 2022-02-14 ENCOUNTER — Ambulatory Visit (INDEPENDENT_AMBULATORY_CARE_PROVIDER_SITE_OTHER): Payer: Medicare Other

## 2022-02-14 VITALS — Ht 69.0 in | Wt 287.0 lb

## 2022-02-14 DIAGNOSIS — Z0000 Encounter for general adult medical examination without abnormal findings: Secondary | ICD-10-CM

## 2022-02-14 NOTE — Patient Instructions (Signed)
[name] , Thank you for taking time to complete your Medicare Wellness Visit. I appreciate your ongoing commitment to your health goals. Please review the following plan we discussed and let me know if I can assist you in the future.   Screening recommendations/referrals: Colonoscopy: due, 12/17/21, discuss with PCP if you change your mind Recommended yearly ophthalmology/optometry visit for glaucoma screening and checkup Recommended yearly dental visit for hygiene and checkup  Vaccinations: Influenza vaccine: up to date Pneumococcal vaccine: up to date  Tdap vaccine: up to date, due 08/27/26 Shingles vaccine: Discuss with pharmacy, if you change your mind   Covid-19: newest booster available at your local pharmacy  Advanced directives: copy on file  Conditions/risks identified: see problem list   Next appointment: Follow up in one year for your annual wellness visit.   Preventive Care 65 Years and Older, Male Preventive care refers to lifestyle choices and visits with your health care provider that can promote health and wellness. What does preventive care include? A yearly physical exam. This is also called an annual well check. Dental exams once or twice a year. Routine eye exams. Ask your health care provider how often you should have your eyes checked. Personal lifestyle choices, including: Daily care of your teeth and gums. Regular physical activity. Eating a healthy diet. Avoiding tobacco and drug use. Limiting alcohol use. Practicing safe sex. Taking low doses of aspirin every day. Taking vitamin and mineral supplements as recommended by your health care provider. What happens during an annual well check? The services and screenings done by your health care provider during your annual well check will depend on your age, overall health, lifestyle risk factors, and family history of disease. Counseling  Your health care provider may ask you questions about your: Alcohol  use. Tobacco use. Drug use. Emotional well-being. Home and relationship well-being. Sexual activity. Eating habits. History of falls. Memory and ability to understand (cognition). Work and work Statistician. Screening  You may have the following tests or measurements: Height, weight, and BMI. Blood pressure. Lipid and cholesterol levels. These may be checked every 5 years, or more frequently if you are over 72 years old. Skin check. Lung cancer screening. You may have this screening every year starting at age 72 if you have a 30-pack-year history of smoking and currently smoke or have quit within the past 15 years. Fecal occult blood test (FOBT) of the stool. You may have this test every year starting at age 72. Flexible sigmoidoscopy or colonoscopy. You may have a sigmoidoscopy every 5 years or a colonoscopy every 10 years starting at age 72. Prostate cancer screening. Recommendations will vary depending on your family history and other risks. Hepatitis C blood test. Hepatitis B blood test. Sexually transmitted disease (STD) testing. Diabetes screening. This is done by checking your blood sugar (glucose) after you have not eaten for a while (fasting). You may have this done every 1-3 years. Abdominal aortic aneurysm (AAA) screening. You may need this if you are a current or former smoker. Osteoporosis. You may be screened starting at age 72 if you are at high risk. Talk with your health care provider about your test results, treatment options, and if necessary, the need for more tests. Vaccines  Your health care provider may recommend certain vaccines, such as: Influenza vaccine. This is recommended every year. Tetanus, diphtheria, and acellular pertussis (Tdap, Td) vaccine. You may need a Td booster every 10 years. Zoster vaccine. You may need this after age 54.  Pneumococcal 13-valent conjugate (PCV13) vaccine. One dose is recommended after age 69. Pneumococcal polysaccharide  (PPSV23) vaccine. One dose is recommended after age 72. Talk to your health care provider about which screenings and vaccines you need and how often you need them. This information is not intended to replace advice given to you by your health care provider. Make sure you discuss any questions you have with your health care provider. Document Released: 12/28/2015 Document Revised: 08/20/2016 Document Reviewed: 10/02/2015 Elsevier Interactive Patient Education  2017 [place] Prevention in the Home Falls can cause injuries. They can happen to people of all ages. There are many things you can do to make your home safe and to help prevent falls. What can I do on the outside of my home? Regularly fix the edges of walkways and driveways and fix any cracks. Remove anything that might make you trip as you walk through a door, such as a raised step or threshold. Trim any bushes or trees on the path to your home. Use bright outdoor lighting. Clear any walking paths of anything that might make someone trip, such as rocks or tools. Regularly check to see if handrails are loose or broken. Make sure that both sides of any steps have handrails. Any raised decks and porches should have guardrails on the edges. Have any leaves, snow, or ice cleared regularly. Use sand or salt on walking paths during winter. Clean up any spills in your garage right away. This includes oil or grease spills. What can I do in the bathroom? Use night lights. Install grab bars by the toilet and in the tub and shower. Do not use towel bars as grab bars. Use non-skid mats or decals in the tub or shower. If you need to sit down in the shower, use a plastic, non-slip stool. Keep the floor dry. Clean up any water that spills on the floor as soon as it happens. Remove soap buildup in the tub or shower regularly. Attach bath mats securely with double-sided non-slip rug tape. Do not have throw rugs and other things on the  floor that can make you trip. What can I do in the bedroom? Use night lights. Make sure that you have a light by your bed that is easy to reach. Do not use any sheets or blankets that are too big for your bed. They should not hang down onto the floor. Have a firm chair that has side arms. You can use this for support while you get dressed. Do not have throw rugs and other things on the floor that can make you trip. What can I do in the kitchen? Clean up any spills right away. Avoid walking on wet floors. Keep items that you use a lot in easy-to-reach places. If you need to reach something above you, use a strong step stool that has a grab bar. Keep electrical cords out of the way. Do not use floor polish or wax that makes floors slippery. If you must use wax, use non-skid floor wax. Do not have throw rugs and other things on the floor that can make you trip. What can I do with my stairs? Do not leave any items on the stairs. Make sure that there are handrails on both sides of the stairs and use them. Fix handrails that are broken or loose. Make sure that handrails are as long as the stairways. Check any carpeting to make sure that it is firmly attached to the stairs. Fix any  carpet that is loose or worn. Avoid having throw rugs at the top or bottom of the stairs. If you do have throw rugs, attach them to the floor with carpet tape. Make sure that you have a light switch at the top of the stairs and the bottom of the stairs. If you do not have them, ask someone to add them for you. What else can I do to help prevent falls? Wear shoes that: Do not have high heels. Have rubber bottoms. Are comfortable and fit you well. Are closed at the toe. Do not wear sandals. If you use a stepladder: Make sure that it is fully opened. Do not climb a closed stepladder. Make sure that both sides of the stepladder are locked into place. Ask someone to hold it for you, if possible. Clearly mark and make  sure that you can see: Any grab bars or handrails. First and last steps. Where the edge of each step is. Use tools that help you move around (mobility aids) if they are needed. These include: Canes. Walkers. Scooters. Crutches. Turn on the lights when you go into a dark area. Replace any light bulbs as soon as they burn out. Set up your furniture so you have a clear path. Avoid moving your furniture around. If any of your floors are uneven, fix them. If there are any pets around you, be aware of where they are. Review your medicines with your doctor. Some medicines can make you feel dizzy. This can increase your chance of falling. Ask your doctor what other things that you can do to help prevent falls. This information is not intended to replace advice given to you by your health care provider. Make sure you discuss any questions you have with your health care provider. Document Released: 09/27/2009 Document Revised: 05/08/2016 Document Reviewed: 01/05/2015 Elsevier Interactive Patient Education  2017 [employer].

## 2022-02-17 MED FILL — Dexamethasone Sodium Phosphate Inj 100 MG/10ML: INTRAMUSCULAR | Qty: 1 | Status: AC

## 2022-02-18 ENCOUNTER — Inpatient Hospital Stay: Payer: Medicare Other

## 2022-02-18 ENCOUNTER — Other Ambulatory Visit: Payer: Self-pay

## 2022-02-18 ENCOUNTER — Inpatient Hospital Stay: Payer: Medicare Other | Attending: Internal Medicine

## 2022-02-18 VITALS — BP 130/74 | HR 84 | Temp 97.8°F | Resp 18 | Wt 290.0 lb

## 2022-02-18 DIAGNOSIS — Z86711 Personal history of pulmonary embolism: Secondary | ICD-10-CM | POA: Insufficient documentation

## 2022-02-18 DIAGNOSIS — Z5111 Encounter for antineoplastic chemotherapy: Secondary | ICD-10-CM | POA: Diagnosis not present

## 2022-02-18 DIAGNOSIS — Z95828 Presence of other vascular implants and grafts: Secondary | ICD-10-CM

## 2022-02-18 DIAGNOSIS — Z7901 Long term (current) use of anticoagulants: Secondary | ICD-10-CM | POA: Insufficient documentation

## 2022-02-18 DIAGNOSIS — C3411 Malignant neoplasm of upper lobe, right bronchus or lung: Secondary | ICD-10-CM

## 2022-02-18 DIAGNOSIS — R300 Dysuria: Secondary | ICD-10-CM | POA: Diagnosis not present

## 2022-02-18 LAB — CMP (CANCER CENTER ONLY)
ALT: 15 U/L (ref 0–44)
AST: 18 U/L (ref 15–41)
Albumin: 3.9 g/dL (ref 3.5–5.0)
Alkaline Phosphatase: 62 U/L (ref 38–126)
Anion gap: 7 (ref 5–15)
BUN: 16 mg/dL (ref 8–23)
CO2: 28 mmol/L (ref 22–32)
Calcium: 8.9 mg/dL (ref 8.9–10.3)
Chloride: 104 mmol/L (ref 98–111)
Creatinine: 1.14 mg/dL (ref 0.61–1.24)
GFR, Estimated: >60 mL/min (ref >60)
Glucose, Bld: 172 mg/dL — ABNORMAL HIGH (ref 70–99)
Potassium: 4.3 mmol/L (ref 3.5–5.1)
Sodium: 139 mmol/L (ref 135–145)
Total Bilirubin: 0.4 mg/dL (ref 0.3–1.2)
Total Protein: 6.4 g/dL — ABNORMAL LOW (ref 6.5–8.1)

## 2022-02-18 LAB — CBC WITH DIFFERENTIAL (CANCER CENTER ONLY)
Abs Immature Granulocytes: 0.04 10*3/uL (ref 0.00–0.07)
Basophils Absolute: 0.1 10*3/uL (ref 0.0–0.1)
Basophils Relative: 1 %
Eosinophils Absolute: 0.1 10*3/uL (ref 0.0–0.5)
Eosinophils Relative: 2 %
HCT: 33.8 % — ABNORMAL LOW (ref 39.0–52.0)
Hemoglobin: 10.9 g/dL — ABNORMAL LOW (ref 13.0–17.0)
Immature Granulocytes: 1 %
Lymphocytes Relative: 12 %
Lymphs Abs: 0.7 10*3/uL (ref 0.7–4.0)
MCH: 29.2 pg (ref 26.0–34.0)
MCHC: 32.2 g/dL (ref 30.0–36.0)
MCV: 90.6 fL (ref 80.0–100.0)
Monocytes Absolute: 0.7 10*3/uL (ref 0.1–1.0)
Monocytes Relative: 11 %
Neutro Abs: 4.4 10*3/uL (ref 1.7–7.7)
Neutrophils Relative %: 73 %
Platelet Count: 218 10*3/uL (ref 150–400)
RBC: 3.73 MIL/uL — ABNORMAL LOW (ref 4.22–5.81)
RDW: 16.2 % — ABNORMAL HIGH (ref 11.5–15.5)
WBC Count: 6 10*3/uL (ref 4.0–10.5)
nRBC: 0 % (ref 0.0–0.2)

## 2022-02-18 MED ORDER — SODIUM CHLORIDE 0.9% FLUSH
10.0000 mL | Freq: Once | INTRAVENOUS | Status: AC
  Administered 2022-02-18: 10 mL

## 2022-02-18 MED ORDER — PALONOSETRON HCL INJECTION 0.25 MG/5ML
0.2500 mg | Freq: Once | INTRAVENOUS | Status: AC
  Administered 2022-02-18: 0.25 mg via INTRAVENOUS
  Filled 2022-02-18: qty 5

## 2022-02-18 MED ORDER — SODIUM CHLORIDE 0.9% FLUSH
10.0000 mL | INTRAVENOUS | Status: DC | PRN
  Administered 2022-02-18: 10 mL

## 2022-02-18 MED ORDER — ATROPINE SULFATE 1 MG/ML IV SOLN
0.5000 mg | Freq: Once | INTRAVENOUS | Status: AC | PRN
  Administered 2022-02-18: 0.5 mg via INTRAVENOUS
  Filled 2022-02-18: qty 1

## 2022-02-18 MED ORDER — SODIUM CHLORIDE 0.9 % IV SOLN
65.0000 mg/m2 | Freq: Once | INTRAVENOUS | Status: AC
  Administered 2022-02-18: 160 mg via INTRAVENOUS
  Filled 2022-02-18: qty 8

## 2022-02-18 MED ORDER — SODIUM CHLORIDE 0.9 % IV SOLN
10.0000 mg | Freq: Once | INTRAVENOUS | Status: AC
  Administered 2022-02-18: 10 mg via INTRAVENOUS
  Filled 2022-02-18: qty 10

## 2022-02-18 MED ORDER — HEPARIN SOD (PORK) LOCK FLUSH 100 UNIT/ML IV SOLN
500.0000 [IU] | Freq: Once | INTRAVENOUS | Status: AC | PRN
  Administered 2022-02-18: 500 [IU]

## 2022-02-18 MED ORDER — SODIUM CHLORIDE 0.9 % IV SOLN: Freq: Once | INTRAVENOUS | Status: AC

## 2022-02-18 NOTE — Patient Instructions (Signed)
[facility]  Discharge Instructions: Thank you for choosing [facility] to provide your oncology and hematology care.   If you have a lab appointment with the [facility], please go directly to the [facility] and check in at the registration area.   Wear comfortable clothing and clothing appropriate for easy access to any Portacath or PICC line.   We strive to give you quality time with your provider. You may need to reschedule your appointment if you arrive late (15 or more minutes).  Arriving late affects you and other patients whose appointments are after yours.  Also, if you miss three or more appointments without notifying the office, you may be dismissed from the clinic at the providers discretion.      For prescription refill requests, have your pharmacy contact our office and allow 72 hours for refills to be completed.    Today you received the following chemotherapy and/or immunotherapy agents: Irinotecan       To help prevent nausea and vomiting after your treatment, we encourage you to take your nausea medication as directed.  BELOW ARE SYMPTOMS THAT SHOULD BE REPORTED IMMEDIATELY: *FEVER GREATER THAN 100.4 F (38 C) OR HIGHER *CHILLS OR SWEATING *NAUSEA AND VOMITING THAT IS NOT CONTROLLED WITH YOUR NAUSEA MEDICATION *UNUSUAL SHORTNESS OF BREATH *UNUSUAL BRUISING OR BLEEDING *URINARY PROBLEMS (pain or burning when urinating, or frequent urination) *BOWEL PROBLEMS (unusual diarrhea, constipation, pain near the anus) TENDERNESS IN MOUTH AND THROAT WITH OR WITHOUT PRESENCE OF ULCERS (sore throat, sores in mouth, or a toothache) UNUSUAL RASH, SWELLING OR PAIN  UNUSUAL VAGINAL DISCHARGE OR ITCHING   Items with * indicate a potential emergency and should be followed up as soon as possible or go to the Emergency Department if any problems should occur.  Please show the CHEMOTHERAPY ALERT CARD or IMMUNOTHERAPY ALERT CARD at check-in  to the Emergency Department and triage nurse.  Should you have questions after your visit or need to cancel or reschedule your appointment, please contact [facility]  Dept: [phone]  and follow the prompts.  Office hours are 8:00 a.m. to 4:30 p.m. Monday - Friday. Please note that voicemails left after 4:00 p.m. may not be returned until the following business day.  We are closed weekends and major holidays. You have access to a nurse at all times for urgent questions. Please call the main number to the clinic Dept: [phone] and follow the prompts.   For any non-urgent questions, you may also contact your provider using MyChart. We now offer e-Visits for anyone 49 and older to request care online for non-urgent symptoms. For details visit [email or web address].   Also download the MyChart app! Go to the app store, search "MyChart", open the app, select [facility], and log in with your MyChart username and password.  Due to Covid, a mask is required upon entering the hospital/clinic. If you do not have a mask, one will be given to you upon arrival. For doctor visits, patients may have 1 support person aged 26 or older with them. For treatment visits, patients cannot have anyone with them due to current Covid guidelines and our immunocompromised population.   Irinotecan injection What is this medication? IRINOTECAN (ir in oh TEE kan ) is a chemotherapy drug. It is used to treat colon and rectal cancer. This medicine may be used for other purposes; ask your health care provider or pharmacist if you have questions. COMMON BRAND NAME(S):  Camptosar What should I tell my care team before I take this medication? They need to know if you have any of these conditions: dehydration diarrhea infection (especially a virus infection such as chickenpox, cold sores, or herpes) liver disease low blood counts, like low white cell, platelet, or red cell counts low levels  of calcium, magnesium, or potassium in the blood recent or ongoing radiation therapy an unusual or allergic reaction to irinotecan, other medicines, foods, dyes, or preservatives pregnant or trying to get pregnant breast-feeding How should I use this medication? This drug is given as an infusion into a vein. It is administered in a hospital or clinic by a specially trained health care professional. Talk to your pediatrician regarding the use of this medicine in children. Special care may be needed. Overdosage: If you think you have taken too much of this medicine contact a poison control center or emergency room at once. NOTE: This medicine is only for you. Do not share this medicine with others. What if I miss a dose? It is important not to miss your dose. Call your doctor or health care professional if you are unable to keep an appointment. What may interact with this medication? Do not take this medicine with any of the following medications: cobicistat itraconazole This medicine may interact with the following medications: antiviral medicines for HIV or AIDS certain antibiotics like rifampin or rifabutin certain medicines for fungal infections like ketoconazole, posaconazole, and voriconazole certain medicines for seizures like carbamazepine, phenobarbital, phenotoin clarithromycin gemfibrozil nefazodone [facility]. John's Wort This list may not describe all possible interactions. Give your health care provider a list of all the medicines, herbs, non-prescription drugs, or dietary supplements you use. Also tell them if you smoke, drink alcohol, or use illegal drugs. Some items may interact with your medicine. What should I watch for while using this medication? Your condition will be monitored carefully while you are receiving this medicine. You will need important blood work done while you are taking this medicine. This drug may make you feel generally unwell. This is not uncommon, as  chemotherapy can affect healthy cells as well as cancer cells. Report any side effects. Continue your course of treatment even though you feel ill unless your doctor tells you to stop. In some cases, you may be given additional medicines to help with side effects. Follow all directions for their use. You may get drowsy or dizzy. Do not drive, use machinery, or do anything that needs mental alertness until you know how this medicine affects you. Do not stand or sit up quickly, especially if you are an older patient. This reduces the risk of dizzy or fainting spells. Call your health care professional for advice if you get a fever, chills, or sore throat, or other symptoms of a cold or flu. Do not treat yourself. This medicine decreases your body's ability to fight infections. Try to avoid being around people who are sick. Avoid taking products that contain aspirin, acetaminophen, ibuprofen, naproxen, or ketoprofen unless instructed by your doctor. These medicines may hide a fever. This medicine may increase your risk to bruise or bleed. Call your doctor or health care professional if you notice any unusual bleeding. Be careful brushing and flossing your teeth or using a toothpick because you may get an infection or bleed more easily. If you have any dental work done, tell your dentist you are receiving this medicine. Do not become pregnant while taking this medicine or for 6 months after stopping it.  Women should inform their health care professional if they wish to become pregnant or think they might be pregnant. Men should not father a child while taking this medicine and for 3 months after stopping it. There is potential for serious side effects to an unborn child. Talk to your health care professional for more information. Do not breast-feed an infant while taking this medicine or for 7 days after stopping it. This medicine has caused ovarian failure in some women. This medicine may make it more  difficult to get pregnant. Talk to your health care professional if you are concerned about your fertility. This medicine has caused decreased sperm counts in some men. This may make it more difficult to father a child. Talk to your health care professional if you are concerned about your fertility. What side effects may I notice from receiving this medication? Side effects that you should report to your doctor or health care professional as soon as possible: allergic reactions like skin rash, itching or hives, swelling of the face, lips, or tongue chest pain diarrhea flushing, runny nose, sweating during infusion low blood counts - this medicine may decrease the number of white blood cells, red blood cells and platelets. You may be at increased risk for infections and bleeding. nausea, vomiting pain, swelling, warmth in the leg signs of decreased platelets or bleeding - bruising, pinpoint red spots on the skin, black, tarry stools, blood in the urine signs of infection - fever or chills, cough, sore throat, pain or difficulty passing urine signs of decreased red blood cells - unusually weak or tired, fainting spells, lightheadedness Side effects that usually do not require medical attention (report to your doctor or health care professional if they continue or are bothersome): constipation hair loss headache loss of appetite mouth sores stomach pain This list may not describe all possible side effects. Call your doctor for medical advice about side effects. You may report side effects to FDA at 1-800-FDA-1088. Where should I keep my medication? This drug is given in a hospital or clinic and will not be stored at home. NOTE: This sheet is a summary. It may not cover all possible information. If you have questions about this medicine, talk to your doctor, pharmacist, or health care provider.  2022 Elsevier/Gold Standard (2021-08-20 00:00:00)

## 2022-02-19 ENCOUNTER — Encounter: Payer: Self-pay | Admitting: Pulmonary Disease

## 2022-02-19 ENCOUNTER — Telehealth: Payer: Self-pay

## 2022-02-19 ENCOUNTER — Ambulatory Visit (INDEPENDENT_AMBULATORY_CARE_PROVIDER_SITE_OTHER): Payer: Medicare Other | Admitting: Pulmonary Disease

## 2022-02-19 ENCOUNTER — Ambulatory Visit: Payer: Medicare Other

## 2022-02-19 ENCOUNTER — Other Ambulatory Visit: Payer: Medicare Other

## 2022-02-19 VITALS — BP 128/66 | HR 88 | Temp 98.1°F | Ht 69.0 in | Wt 294.0 lb

## 2022-02-19 DIAGNOSIS — C3411 Malignant neoplasm of upper lobe, right bronchus or lung: Secondary | ICD-10-CM

## 2022-02-19 DIAGNOSIS — J90 Pleural effusion, not elsewhere classified: Secondary | ICD-10-CM

## 2022-02-19 NOTE — Patient Instructions (Signed)
Thank you for visiting Dr. Valeta Harms at [facility]. ?Today we recommend the following: ? ?Call me is symptoms return and we will get a CXR and set you up for a repeat ? ?Return in about 2 months (around 04/21/2022) for w/ Dr. Valeta Harms . ? ? ? ?Please do your part to reduce the spread of COVID-19.  ? ?

## 2022-02-19 NOTE — Progress Notes (Signed)
Synopsis: Referred in December 2022 for recurrent pleural effusion by [name], NP  Subjective:   PATIENT ID: Adam Wise GENDER: male DOB: 16-May-1951, MRN: [id number]  Chief Complaint  Patient presents with   Follow-up    Follow up. Patient says everything is going good.     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  Patient was completed palliative radiotherapy and systemic chemotherapy followed by Dr. Earlie Server.  Patient unfortunately has had a recurrent right-sided effusion.  He has had 6 thoracentesis in the past.  Patient was referred today to discuss possibility of Pleurx catheter placement or consideration for other options of recurrent pleural effusion.  He has a longtime history of tobacco use.  Currently using albuterol only.  Was placed on Anoro in the past with no significant improvement.  When you know if there is other inhaler options.  OV 01/16/2022: Patient was seen today for follow-up after recent surgical consultation.  Dr. Kipp Brood felt as if risk for thoracotomy were too high and concern for BPF if operating on him.  Therefore we are here today to discuss pigtail placement for talc pleurodesis versus a indwelling pleural catheter placement.  Patient would like to proceed with talc pleurodesis.  We talked about the risk benefits of both today in the office.  He does have increasing shortness of breath.  Ultrasound completed also reveals persistent right-sided fluid accumulation today in the office.  He also had a two-view chest x-ray confirming this.  We reviewed this today in the office as well.  OV 02/19/2022: Here today for follow-up after recent talc pleurodesis.  Patient did well with the pigtail catheter placement drainage and talc  pleurodesis.  He was discharged home.  From respiratory standpoint doing okay.  He did have a recent CT scan of the chest on 02/06/2022 which revealed small amount of reaccumulation of fluid in the right chest.  However he does feel a lot better does not have the shortness of breath that he had in the past.   Past Medical History:  Diagnosis Date   Chickenpox    Chronic knee pain    Chronic low back pain    Coronary artery calcification seen on CAT scan 11/2021   Coronary CTA 11/27/2021: Moderate to large right pleural effusion and compressive atelectasis right lung base. => Coronary Calcium Score 657.  Diffuse RCA calcification.  Minimal mild disease in the LAD and diagonal branches. == Overall limited study.  Notable artifact.   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 He has disease progression after the first line of chemotherapy in December 2021.   Testosterone deficiency    Type 2 diabetes mellitus ([place])      Family History  Problem Relation Age of Onset   Heart attack Mother    Cancer Mother    Hypertension Mother    Arthritis Father    Asthma Father    Cancer Father    COPD Father    Heart  attack Father    Hypertension Sister    Cancer Sister    Diabetes Sister    Asthma Sister    Cancer Sister    COPD Sister    Arthritis Sister    Asthma Sister    Diabetes Sister    Birth defects Maternal Grandfather    Arthritis Paternal Grandmother    Diabetes Paternal Grandmother    Arthritis Paternal Grandfather    Asthma Son    Other Neg Hx        pituitary disorder     Past Surgical History:  Procedure Laterality Date   CHEST TUBE  INSERTION Right 01/20/2022   Procedure: CHEST TUBE INSERTION;  Surgeon: Lorin Glass, MD;  Location: [facility] ENDOSCOPY;  Service: Pulmonary;  Laterality: Right;  w/ Talc Pleurodesis, planned admit for Obs afterwards   COLONOSCOPY WITH PROPOFOL N/A 12/17/2018   Procedure: COLONOSCOPY WITH PROPOFOL;  Surgeon: Pasty Spillers, MD;  Location: [facility] ENDOSCOPY;  Service: Gastroenterology;  Laterality: N/A;   IR IMAGING GUIDED PORT INSERTION  04/17/2020   IR THORACENTESIS ASP PLEURAL SPACE W/IMG GUIDE  11/29/2021   IR THORACENTESIS ASP PLEURAL SPACE W/IMG GUIDE  01/03/2022   JOINT REPLACEMENT Bilateral    REPLACEMENT TOTAL KNEE BILATERAL  2015   TONSILLECTOMY  1960   TRANSTHORACIC ECHOCARDIOGRAM  05/2020   a) 05/2020: EF 55 to 60%.  No R WMA.  Mild LVH.  Indeterminate LVEDP.  Unable to assess RVP.  Normal aortic and mitral valves.  Mildly elevated RAP.; b) 09/2021: EF 50-55%. No RWMA. Mild LVH. ~ LVEDP. Mild LA Dilation. NORMAL RV/RAP.  Normal MV/AoV.    Social History   Socioeconomic History   Marital status: Married    Spouse name: Not on file   Number of children: Not on file   Years of education: Not on file   Highest education level: Not on file  Occupational History   Not on file  Tobacco Use   Smoking status: Former   Smokeless tobacco: Never  Vaping Use   Vaping Use: Never used  Substance and Sexual Activity   Alcohol use: Not Currently    Comment: rarely   Drug use: Not Currently   Sexual activity: Yes  Other Topics Concern   Not on file  Social History Narrative   Married.   Moved from [place].   Retired.   Social Determinants of Health   Financial Resource Strain: Low Risk    Difficulty of Paying Living Expenses: Not hard at all  Food Insecurity: No Food Insecurity   Worried About Programme researcher, broadcasting/film/video in the Last Year: Never true   Ran Out of Food in the Last Year: Never true  Transportation Needs: No Transportation Needs   Lack of Transportation (Medical): No   Lack of  Transportation (Non-Medical): No  Physical Activity: Inactive   Days of Exercise per Week: 0 days   Minutes of Exercise per Session: 0 min  Stress: No Stress Concern Present   Feeling of Stress : Only a little  Social Connections: Moderately Integrated   Frequency of Communication with Friends and Family: Twice a week   Frequency of Social Gatherings with Friends and Family: Twice a week   Attends Religious Services: 1 to 4 times per year   Active Member of [employer] or Organizations: No   Attends Banker Meetings: Never   Marital Status: Married  Catering manager Violence: Not At Risk   Fear of Current or Ex-Partner: No  Emotionally Abused: No   Physically Abused: No   Sexually Abused: No     Allergies  Allergen Reactions   Bupropion     Racing heart   Hydrochlorothiazide Other (See Comments)    Cramping to lower extremities     Outpatient Medications Prior to Visit  Medication Sig Dispense Refill   albuterol (VENTOLIN HFA) 108 (90 Base) MCG/ACT inhaler Inhale 2 puffs into the lungs every 6 (six) hours as needed for wheezing or shortness of breath. 18 g 0   amLODipine (NORVASC) 10 MG tablet TAKE 1 TABLET BY MOUTH EVERY DAY FOR BLOOD PRESSURE 90 tablet 3   apixaban (ELIQUIS) 5 MG TABS tablet Take 1 tablet (5 mg total) by mouth 2 (two) times daily. 180 tablet 2   B-D 3CC LUER-LOK SYR 22GX1" 22G X 1" 3 ML MISC USE AS INSTRUCTED FOR TESTOSTERONE INJECTION EVERY 2 WEEKS 10 each 2   bisoprolol (ZEBETA) 5 MG tablet Take 1 tablet (5 mg total) by mouth daily. 30 tablet 6   Black Pepper-Turmeric (TURMERIC COMPLEX/BLACK PEPPER PO) Take 1 tablet by mouth in the morning and at bedtime.     blood glucose meter kit and supplies KIT Dispense based on patient and insurance preference. Use up to four times daily as directed. 1 each 0   chlorpheniramine-HYDROcodone (TUSSIONEX) 10-8 MG/5ML SUER Take 5 mLs by mouth at bedtime as needed for cough. 473 mL 0   esomeprazole (NEXIUM) 20 MG  capsule Take 20 mg by mouth in the morning.     gabapentin (NEURONTIN) 300 MG capsule Take 300 mg by mouth at bedtime.     glipiZIDE (GLUCOTROL) 10 MG tablet TAKE 1 TABLET (10 MG TOTAL) BY MOUTH 2 (TWO) TIMES DAILY BEFORE A MEAL. FOR DIABETES. 180 tablet 3   Insulin Glargine (BASAGLAR KWIKPEN) 100 UNIT/ML Inject 20 Units into the skin at bedtime. For diabetes. 30 mL 1   Insulin Pen Needle (BD PEN NEEDLE NANO U/F) 32G X 4 MM MISC Use with insulin as prescribed  Dx Code: E11.9 100 each 2   ipratropium-albuterol (DUONEB) 0.5-2.5 (3) MG/3ML SOLN Take 3 mLs by nebulization every 4 (four) hours while awake for 3 days, THEN 3 mLs every 4 (four) hours as needed (shortness of breath or wheezing). 360 mL 0   Lancets MISC USE UP TO 3 TIMES DAILY AS DIRECTED 100 each 2   losartan (COZAAR) 50 MG tablet Take 1 tablet (50 mg total) by mouth daily. For blood pressure. 90 tablet 3   Melatonin 10 MG CAPS Take 10 mg by mouth at bedtime.     metFORMIN (GLUCOPHAGE) 1000 MG tablet TAKE 1 TABLET (1,000 MG TOTAL) BY MOUTH 2 (TWO) TIMES DAILY WITH A MEAL. FOR DIABETES 180 tablet 1   Multiple Vitamin (MULTI-VITAMINS) TABS Take 1 tablet by mouth in the morning.     Omega-3 Fatty Acids (SALMON OIL PO) Take 1 capsule by mouth daily.     ONETOUCH ULTRA test strip USE UP TO 4 TIMES DAILY AS DIRECTED 100 strip 5   oxyCODONE-acetaminophen (PERCOCET) 5-325 MG tablet Take 1-2 tablets by mouth every 4 (four) hours as needed for severe pain. 20 tablet 0   oxymetazoline (AFRIN) 0.05 % nasal spray Place 1 spray into both nostrils daily.     pioglitazone (ACTOS) 45 MG tablet TAKE 1 TABLET (45 MG TOTAL) BY MOUTH DAILY. FOR DIABETES. 90 tablet 0   pravastatin (PRAVACHOL) 40 MG tablet TAKE 1 TABLET BY MOUTH EVERY DAY IN THE EVENING FOR  CHOLESTEROL 90 tablet 1   testosterone cypionate (DEPOTESTOSTERONE CYPIONATE) 200 MG/ML injection Inject 200 mg into the muscle every 14 (fourteen) days.     No facility-administered medications prior to  visit.    Review of Systems  Constitutional:  Negative for chills, fever, malaise/fatigue and weight loss.  HENT:  Negative for hearing loss, sore throat and tinnitus.   Eyes:  Negative for blurred vision and double vision.  Respiratory:  Positive for shortness of breath. Negative for cough, hemoptysis, sputum production, wheezing and stridor.   Cardiovascular:  Negative for chest pain, palpitations, orthopnea, leg swelling and PND.  Gastrointestinal:  Negative for abdominal pain, constipation, diarrhea, heartburn, nausea and vomiting.  Genitourinary:  Negative for dysuria, hematuria and urgency.  Musculoskeletal:  Negative for joint pain and myalgias.  Skin:  Negative for itching and rash.  Neurological:  Negative for dizziness, tingling, weakness and headaches.  Endo/Heme/Allergies:  Negative for environmental allergies. Does not bruise/bleed easily.  Psychiatric/Behavioral:  Negative for depression. The patient is not nervous/anxious and does not have insomnia.   All other systems reviewed and are negative.   Objective:  Physical Exam Vitals reviewed.  Constitutional:      General: He is not in acute distress.    Appearance: He is well-developed. He is obese.  HENT:     Head: Normocephalic and atraumatic.  Eyes:     General: No scleral icterus.    Conjunctiva/sclera: Conjunctivae normal.     Pupils: Pupils are equal, round, and reactive to light.  Neck:     Vascular: No JVD.     Trachea: No tracheal deviation.  Cardiovascular:     Rate and Rhythm: Normal rate and regular rhythm.     Heart sounds: Normal heart sounds. No murmur heard. Pulmonary:     Effort: Pulmonary effort is normal. No tachypnea, accessory muscle usage or respiratory distress.     Breath sounds: Normal breath sounds. No stridor. No wheezing, rhonchi or rales.  Abdominal:     General: There is no distension.     Palpations: Abdomen is soft.     Tenderness: There is no abdominal tenderness.     Comments:  Obese pannus   Musculoskeletal:        General: No tenderness.     Cervical back: Neck supple.  Lymphadenopathy:     Cervical: No cervical adenopathy.  Skin:    General: Skin is warm and dry.     Capillary Refill: Capillary refill takes less than 2 seconds.     Findings: No rash.  Neurological:     Mental Status: He is alert and oriented to person, place, and time.  Psychiatric:        Behavior: Behavior normal.     Vitals:   02/19/22 1126  BP: 128/66  Pulse: 88  Temp: 98.1 F (36.7 C)  TempSrc: Oral  SpO2: 94%  Weight: 294 lb (133.4 kg)  Height: $Remove'5\' 9"'MWCXMOE$  (1.753 m)   94% on RA BMI Readings from Last 3 Encounters:  02/19/22 43.42 kg/m  02/18/22 42.83 kg/m  02/14/22 42.38 kg/m   Wt Readings from Last 3 Encounters:  02/19/22 294 lb (133.4 kg)  02/18/22 290 lb (131.5 kg)  02/14/22 287 lb (130.2 kg)     CBC    Component Value Date/Time   WBC 6.0 02/18/2022 0747   WBC 2.5 (L) 10/18/2021 1533   RBC 3.73 (L) 02/18/2022 0747   HGB 10.9 (L) 02/18/2022 0747   HCT 33.8 (L) 02/18/2022 0747  HCT 42.7 10/06/2017 1052   PLT 218 02/18/2022 0747   MCV 90.6 02/18/2022 0747   MCH 29.2 02/18/2022 0747   MCHC 32.2 02/18/2022 0747   RDW 16.2 (H) 02/18/2022 0747   LYMPHSABS 0.7 02/18/2022 0747   MONOABS 0.7 02/18/2022 0747   EOSABS 0.1 02/18/2022 0747   BASOSABS 0.1 02/18/2022 0747     Chest Imaging: Chest x-ray 11/29/2021: No significant effusion on the right following a thoracentesis, no pneumothorax.  Some scarring in the right base, density in the medial portion of the right upper lobe history of small cell with radiation. The patient's images have been independently reviewed by me.    CT Chest Feb 2022: CT scan of the chest reveals some pleural studding as well as small pleural effusion on the right side. The patient's images have been independently reviewed by me.    Pulmonary Functions Testing Results: No flowsheet data found.  FeNO:   Pathology:    Echocardiogram:   Heart Catheterization:     Assessment & Plan:     ICD-10-CM   1. Recurrent pleural effusion  J90     2. Small cell lung cancer, right upper lobe (HCC)  C34.11       Discussion:  This is a 71 year old gentleman, history of COPD, stage IV small cell carcinoma with recurrent right-sided pleural effusion has been tapped several times.  Decision was made in February to undergo pigtail catheter placement and attempted talc pleurodesis outpatient.  He had some improvement with that.  However the effusion has recurred to a small extent in the right side.  Plan: I think the next best step is for him to watch his symptoms. If the symptoms get worse again then I think we should repeat his procedure however potentially admit him to the hospital for short period. I think what we should do is consider placement of the tube drainage that is much as we possibly can drain from the chest and then do the talc pleurodesis That way we can give him the most amount of time as possible on suction and allow adherence of the lower lobe. An alternate would be consideration for doxycycline pleurodesis. At this time we will discontinue to monitor his symptoms and as long as he is doing okay he just started a new immunotherapy option with oncology. He will come back and see [place] in a couple of months with repeat chest x-ray and will ultrasound in the office if we need to.    Current Outpatient Medications:    albuterol (VENTOLIN HFA) 108 (90 Base) MCG/ACT inhaler, Inhale 2 puffs into the lungs every 6 (six) hours as needed for wheezing or shortness of breath., Disp: 18 g, Rfl: 0   amLODipine (NORVASC) 10 MG tablet, TAKE 1 TABLET BY MOUTH EVERY DAY FOR BLOOD PRESSURE, Disp: 90 tablet, Rfl: 3   apixaban (ELIQUIS) 5 MG TABS tablet, Take 1 tablet (5 mg total) by mouth 2 (two) times daily., Disp: 180 tablet, Rfl: 2   B-D 3CC LUER-LOK SYR 22GX1" 22G X 1" 3 ML MISC, USE AS INSTRUCTED FOR TESTOSTERONE  INJECTION EVERY 2 WEEKS, Disp: 10 each, Rfl: 2   bisoprolol (ZEBETA) 5 MG tablet, Take 1 tablet (5 mg total) by mouth daily., Disp: 30 tablet, Rfl: 6   Black Pepper-Turmeric (TURMERIC COMPLEX/BLACK PEPPER PO), Take 1 tablet by mouth in the morning and at bedtime., Disp: , Rfl:    blood glucose meter kit and supplies KIT, Dispense based on patient and insurance preference.  Use up to four times daily as directed., Disp: 1 each, Rfl: 0   chlorpheniramine-HYDROcodone (TUSSIONEX) 10-8 MG/5ML SUER, Take 5 mLs by mouth at bedtime as needed for cough., Disp: 473 mL, Rfl: 0   esomeprazole (NEXIUM) 20 MG capsule, Take 20 mg by mouth in the morning., Disp: , Rfl:    gabapentin (NEURONTIN) 300 MG capsule, Take 300 mg by mouth at bedtime., Disp: , Rfl:    glipiZIDE (GLUCOTROL) 10 MG tablet, TAKE 1 TABLET (10 MG TOTAL) BY MOUTH 2 (TWO) TIMES DAILY BEFORE A MEAL. FOR DIABETES., Disp: 180 tablet, Rfl: 3   Insulin Glargine (BASAGLAR KWIKPEN) 100 UNIT/ML, Inject 20 Units into the skin at bedtime. For diabetes., Disp: 30 mL, Rfl: 1   Insulin Pen Needle (BD PEN NEEDLE NANO U/F) 32G X 4 MM MISC, Use with insulin as prescribed  Dx Code: E11.9, Disp: 100 each, Rfl: 2   ipratropium-albuterol (DUONEB) 0.5-2.5 (3) MG/3ML SOLN, Take 3 mLs by nebulization every 4 (four) hours while awake for 3 days, THEN 3 mLs every 4 (four) hours as needed (shortness of breath or wheezing)., Disp: 360 mL, Rfl: 0   Lancets MISC, USE UP TO 3 TIMES DAILY AS DIRECTED, Disp: 100 each, Rfl: 2   losartan (COZAAR) 50 MG tablet, Take 1 tablet (50 mg total) by mouth daily. For blood pressure., Disp: 90 tablet, Rfl: 3   Melatonin 10 MG CAPS, Take 10 mg by mouth at bedtime., Disp: , Rfl:    metFORMIN (GLUCOPHAGE) 1000 MG tablet, TAKE 1 TABLET (1,000 MG TOTAL) BY MOUTH 2 (TWO) TIMES DAILY WITH A MEAL. FOR DIABETES, Disp: 180 tablet, Rfl: 1   Multiple Vitamin (MULTI-VITAMINS) TABS, Take 1 tablet by mouth in the morning., Disp: , Rfl:    Omega-3 Fatty Acids  (SALMON OIL PO), Take 1 capsule by mouth daily., Disp: , Rfl:    ONETOUCH ULTRA test strip, USE UP TO 4 TIMES DAILY AS DIRECTED, Disp: 100 strip, Rfl: 5   oxyCODONE-acetaminophen (PERCOCET) 5-325 MG tablet, Take 1-2 tablets by mouth every 4 (four) hours as needed for severe pain., Disp: 20 tablet, Rfl: 0   oxymetazoline (AFRIN) 0.05 % nasal spray, Place 1 spray into both nostrils daily., Disp: , Rfl:    pioglitazone (ACTOS) 45 MG tablet, TAKE 1 TABLET (45 MG TOTAL) BY MOUTH DAILY. FOR DIABETES., Disp: 90 tablet, Rfl: 0   pravastatin (PRAVACHOL) 40 MG tablet, TAKE 1 TABLET BY MOUTH EVERY DAY IN THE EVENING FOR CHOLESTEROL, Disp: 90 tablet, Rfl: 1   testosterone cypionate (DEPOTESTOSTERONE CYPIONATE) 200 MG/ML injection, Inject 200 mg into the muscle every 14 (fourteen) days., Disp: , Rfl:    Garner Nash, DO [facility] 02/19/2022 4:27 PM

## 2022-02-19 NOTE — Telephone Encounter (Signed)
-----   Message from Derrick Ravel, RN sent at 02/18/2022 11:57 AM EST ----- ?Regarding: First time irinotecan, Dr. Julien Nordmann patient. ?First time irinotecan infusion, Dr. Julien Nordmann patient. He tolerated infusion well, please give follow up phone call tomorrow. Thank you! ? ?

## 2022-02-19 NOTE — Telephone Encounter (Signed)
[name] states that he is eating, drinking, and urinating well. He did get IV atropine yesterday while receiving the infusion.  He has not had any loose stools since atropine. ?Pt aware of the office number [phone] to call if he has any questions or concerns ?

## 2022-02-24 ENCOUNTER — Inpatient Hospital Stay (HOSPITAL_BASED_OUTPATIENT_CLINIC_OR_DEPARTMENT_OTHER): Payer: Medicare Other | Admitting: Internal Medicine

## 2022-02-24 ENCOUNTER — Inpatient Hospital Stay: Payer: Medicare Other

## 2022-02-24 ENCOUNTER — Other Ambulatory Visit: Payer: Self-pay | Admitting: Internal Medicine

## 2022-02-24 ENCOUNTER — Other Ambulatory Visit: Payer: Medicare Other

## 2022-02-24 ENCOUNTER — Ambulatory Visit: Payer: Medicare Other

## 2022-02-24 ENCOUNTER — Ambulatory Visit: Payer: Medicare Other | Admitting: Internal Medicine

## 2022-02-24 ENCOUNTER — Other Ambulatory Visit: Payer: Self-pay

## 2022-02-24 VITALS — BP 120/70 | HR 77 | Temp 98.0°F | Resp 18 | Wt 284.5 lb

## 2022-02-24 DIAGNOSIS — Z5111 Encounter for antineoplastic chemotherapy: Secondary | ICD-10-CM

## 2022-02-24 DIAGNOSIS — Z7901 Long term (current) use of anticoagulants: Secondary | ICD-10-CM | POA: Diagnosis not present

## 2022-02-24 DIAGNOSIS — R300 Dysuria: Secondary | ICD-10-CM | POA: Diagnosis not present

## 2022-02-24 DIAGNOSIS — C3411 Malignant neoplasm of upper lobe, right bronchus or lung: Secondary | ICD-10-CM

## 2022-02-24 DIAGNOSIS — Z86711 Personal history of pulmonary embolism: Secondary | ICD-10-CM | POA: Diagnosis not present

## 2022-02-24 DIAGNOSIS — Z95828 Presence of other vascular implants and grafts: Secondary | ICD-10-CM

## 2022-02-24 LAB — CMP (CANCER CENTER ONLY)
ALT: 16 U/L (ref 0–44)
AST: 15 U/L (ref 15–41)
Albumin: 4 g/dL (ref 3.5–5.0)
Alkaline Phosphatase: 60 U/L (ref 38–126)
Anion gap: 6 (ref 5–15)
BUN: 23 mg/dL (ref 8–23)
CO2: 29 mmol/L (ref 22–32)
Calcium: 8.8 mg/dL — ABNORMAL LOW (ref 8.9–10.3)
Chloride: 103 mmol/L (ref 98–111)
Creatinine: 1.18 mg/dL (ref 0.61–1.24)
GFR, Estimated: >60 mL/min (ref >60)
Glucose, Bld: 145 mg/dL — ABNORMAL HIGH (ref 70–99)
Potassium: 4.4 mmol/L (ref 3.5–5.1)
Sodium: 138 mmol/L (ref 135–145)
Total Bilirubin: 0.4 mg/dL (ref 0.3–1.2)
Total Protein: 6.4 g/dL — ABNORMAL LOW (ref 6.5–8.1)

## 2022-02-24 LAB — CBC WITH DIFFERENTIAL (CANCER CENTER ONLY)
Abs Immature Granulocytes: 0.03 10*3/uL (ref 0.00–0.07)
Basophils Absolute: 0 10*3/uL (ref 0.0–0.1)
Basophils Relative: 1 %
Eosinophils Absolute: 0.3 10*3/uL (ref 0.0–0.5)
Eosinophils Relative: 5 %
HCT: 33 % — ABNORMAL LOW (ref 39.0–52.0)
Hemoglobin: 10.9 g/dL — ABNORMAL LOW (ref 13.0–17.0)
Immature Granulocytes: 1 %
Lymphocytes Relative: 10 %
Lymphs Abs: 0.7 10*3/uL (ref 0.7–4.0)
MCH: 29.4 pg (ref 26.0–34.0)
MCHC: 33 g/dL (ref 30.0–36.0)
MCV: 88.9 fL (ref 80.0–100.0)
Monocytes Absolute: 0.4 10*3/uL (ref 0.1–1.0)
Monocytes Relative: 7 %
Neutro Abs: 5.1 10*3/uL (ref 1.7–7.7)
Neutrophils Relative %: 76 %
Platelet Count: 202 10*3/uL (ref 150–400)
RBC: 3.71 MIL/uL — ABNORMAL LOW (ref 4.22–5.81)
RDW: 15.6 % — ABNORMAL HIGH (ref 11.5–15.5)
WBC Count: 6.5 10*3/uL (ref 4.0–10.5)
nRBC: 0 % (ref 0.0–0.2)

## 2022-02-24 LAB — URINALYSIS, COMPLETE (UACMP) WITH MICROSCOPIC
Bilirubin Urine: NEGATIVE
Glucose, UA: NEGATIVE mg/dL
Ketones, ur: 5 mg/dL — AB
Nitrite: NEGATIVE
Protein, ur: 100 mg/dL — AB
Specific Gravity, Urine: 1.02 (ref 1.005–1.030)
WBC, UA: >50 WBC/hpf — ABNORMAL HIGH (ref 0–5)
pH: 5 (ref 5.0–8.0)

## 2022-02-24 MED ORDER — SODIUM CHLORIDE 0.9 % IV SOLN: Freq: Once | INTRAVENOUS | Status: AC

## 2022-02-24 MED ORDER — SODIUM CHLORIDE 0.9 % IV SOLN
10.0000 mg | Freq: Once | INTRAVENOUS | Status: AC
  Administered 2022-02-24: 10 mg via INTRAVENOUS
  Filled 2022-02-24: qty 10

## 2022-02-24 MED ORDER — PALONOSETRON HCL INJECTION 0.25 MG/5ML
0.2500 mg | Freq: Once | INTRAVENOUS | Status: AC
  Administered 2022-02-24: 0.25 mg via INTRAVENOUS
  Filled 2022-02-24: qty 5

## 2022-02-24 MED ORDER — SODIUM CHLORIDE 0.9 % IV SOLN
65.0000 mg/m2 | Freq: Once | INTRAVENOUS | Status: AC
  Administered 2022-02-24: 160 mg via INTRAVENOUS
  Filled 2022-02-24: qty 8

## 2022-02-24 MED ORDER — ATROPINE SULFATE 1 MG/ML IV SOLN
0.5000 mg | Freq: Once | INTRAVENOUS | Status: AC | PRN
  Administered 2022-02-24: 0.5 mg via INTRAVENOUS
  Filled 2022-02-24: qty 1

## 2022-02-24 MED ORDER — HEPARIN SOD (PORK) LOCK FLUSH 100 UNIT/ML IV SOLN
500.0000 [IU] | Freq: Once | INTRAVENOUS | Status: AC | PRN
  Administered 2022-02-24: 500 [IU]

## 2022-02-24 MED ORDER — SULFAMETHOXAZOLE-TRIMETHOPRIM 800-160 MG PO TABS: 1.0000 | ORAL_TABLET | Freq: Two times a day (BID) | ORAL | 0 refills | Status: DC

## 2022-02-24 MED ORDER — SODIUM CHLORIDE 0.9% FLUSH
10.0000 mL | Freq: Once | INTRAVENOUS | Status: AC
  Administered 2022-02-24: 10 mL

## 2022-02-24 MED ORDER — SODIUM CHLORIDE 0.9% FLUSH
10.0000 mL | INTRAVENOUS | Status: DC | PRN
  Administered 2022-02-24: 10 mL

## 2022-02-24 NOTE — Patient Instructions (Signed)
[facility]  Discharge Instructions: ?Thank you for choosing [facility] to provide your oncology and hematology care.  ? ?If you have a lab appointment with the [facility], please go directly to the [facility] and check in at the registration area. ?  ?Wear comfortable clothing and clothing appropriate for easy access to any Portacath or PICC line.  ? ?We strive to give you quality time with your provider. You may need to reschedule your appointment if you arrive late (15 or more minutes).  Arriving late affects you and other patients whose appointments are after yours.  Also, if you miss three or more appointments without notifying the office, you may be dismissed from the clinic at the provider?s discretion.    ?  ?For prescription refill requests, have your pharmacy contact our office and allow 72 hours for refills to be completed.   ? ?Today you received the following chemotherapy and/or immunotherapy agents: Irinotecan    ?  ?To help prevent nausea and vomiting after your treatment, we encourage you to take your nausea medication as directed. ? ?BELOW ARE SYMPTOMS THAT SHOULD BE REPORTED IMMEDIATELY: ?*FEVER GREATER THAN 100.4 F (38 ?C) OR HIGHER ?*CHILLS OR SWEATING ?*NAUSEA AND VOMITING THAT IS NOT CONTROLLED WITH YOUR NAUSEA MEDICATION ?*UNUSUAL SHORTNESS OF BREATH ?*UNUSUAL BRUISING OR BLEEDING ?*URINARY PROBLEMS (pain or burning when urinating, or frequent urination) ?*BOWEL PROBLEMS (unusual diarrhea, constipation, pain near the anus) ?TENDERNESS IN MOUTH AND THROAT WITH OR WITHOUT PRESENCE OF ULCERS (sore throat, sores in mouth, or a toothache) ?UNUSUAL RASH, SWELLING OR PAIN  ?UNUSUAL VAGINAL DISCHARGE OR ITCHING  ? ?Items with * indicate a potential emergency and should be followed up as soon as possible or go to the Emergency Department if any problems should occur. ? ?Please show the CHEMOTHERAPY ALERT CARD or IMMUNOTHERAPY ALERT CARD at check-in to  the Emergency Department and triage nurse. ? ?Should you have questions after your visit or need to cancel or reschedule your appointment, please contact [facility]  Dept: [phone]  and follow the prompts.  Office hours are 8:00 a.m. to 4:30 p.m. Monday - Friday. Please note that voicemails left after 4:00 p.m. may not be returned until the following business day.  We are closed weekends and major holidays. You have access to a nurse at all times for urgent questions. Please call the main number to the clinic Dept: [phone] and follow the prompts. ? ? ?For any non-urgent questions, you may also contact your provider using MyChart. We now offer e-Visits for anyone 34 and older to request care online for non-urgent symptoms. For details visit [email or web address]. ?  ?Also download the MyChart app! Go to the app store, search "MyChart", open the app, select [facility], and log in with your MyChart username and password. ? ?Due to Covid, a mask is required upon entering the hospital/clinic. If you do not have a mask, one will be given to you upon arrival. For doctor visits, patients may have 1 support person aged 33 or older with them. For treatment visits, patients cannot have anyone with them due to current Covid guidelines and our immunocompromised population.  ? ?

## 2022-02-24 NOTE — Progress Notes (Signed)
[place] Telephone:([phone]) [phone]   Fax:([phone]  OFFICE PROGRESS NOTE  [name], NP [place] [address]  DIAGNOSIS: Relapsed extensive stage (T4, N3, M1c)  small cell lung cancer diagnosed in April 2021 and presented with large right upper lobe/right hilar mass with ipsilateral and contralateral mediastinal and right supraclavicular lymphadenopathy in addition to multiple liver lesions. He has disease progression after the first line of chemotherapy in December 2021.  PRIOR THERAPY: 1) Palliative radiotherapy to the right upper lobe lung mass under the care of Dr. Lisbeth Renshaw. 2) Systemic chemotherapy with carboplatin for AUC of 5 on day 1, etoposide 100 mg/M2 on days 1, 2 and 3 in addition to Imfinzi 1500 mg IV every 3 weeks with chemotherapy then every 4 weeks for maintenance if the patient has no evidence for progression.  He will also receive Cosela 240 mg/m2 on the days of the chemotherapy.  Status post 9 cycles.  Starting from cycle #5 the patient will be on maintenance treatment with immunotherapy with Imfinzi 1500 mg IV every 4 weeks. Last dose of chemotherapy was given on November 13, 2020. This treatment was discontinued secondary to disease progression 3) Systemic chemotherapy with carboplatin for AUC of 5 on day 1, etoposide 100 mg/M2 on days 1, 2 and 3, Tecentriq 1200 mg IV every 3 weeks as well as Cosela 250 mg/M2 on the days of the chemotherapy every 3 weeks.  First dose December 18, 2020.  Status post 8 cycles. 4) Zepzelca 3.2 mgm2 IV every 3 weeks. Last dose on 01/23/22. Status post 12 cycles.    CURRENT THERAPY: Palliative systemic chemotherapy with irinotecan 65 mg/m2 on days 1 and 8 IV every 3 weeks.  Status post day 1 of cycle #1.  INTERVAL HISTORY: Adam Wise 71 y.o. male was seen at the chemotherapy treatment room today for evaluation before starting day 8 of cycle #1.  The patient is feeling fine today with no concerning  complaints.  He denied having any current chest pain, shortness of breath except with exertion with mild cough and no hemoptysis.  He denied having any nausea, vomiting, diarrhea or constipation.  He denied having any headache or visual changes.  He complains of mild dysuria today.  He was seen recently by Dr. Valeta Harms for evaluation of the recurrent right pleural effusion and it was moderate effusion and the recommendation was to monitor it for now unless the patient becomes symptomatic or the fluid enlarges.  He is here today for evaluation before starting day 8 of cycle #1.  MEDICAL HISTORY: Past Medical History:  Diagnosis Date   Chickenpox    Chronic knee pain    Chronic low back pain    Coronary artery calcification seen on CAT scan 11/2021   Coronary CTA 11/27/2021: Moderate to large right pleural effusion and compressive atelectasis right lung base. => Coronary Calcium Score 657.  Diffuse RCA calcification.  Minimal mild disease in the LAD and diagonal branches. == Overall limited study.  Notable artifact.   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 He has disease progression after the first line of chemotherapy in  December 2021.   Testosterone deficiency    Type 2 diabetes mellitus (HCC)     ALLERGIES:  is allergic to bupropion and hydrochlorothiazide.  MEDICATIONS:  Current Outpatient Medications  Medication Sig Dispense Refill   albuterol (VENTOLIN HFA) 108 (90 Base) MCG/ACT inhaler Inhale 2 puffs into the lungs every 6 (six) hours as needed for wheezing or shortness of breath. 18 g 0   amLODipine (NORVASC) 10 MG tablet TAKE 1 TABLET BY MOUTH  EVERY DAY FOR BLOOD PRESSURE 90 tablet 3   apixaban (ELIQUIS) 5 MG TABS tablet Take 1 tablet (5 mg total) by mouth 2 (two) times daily. 180 tablet 2   B-D 3CC LUER-LOK SYR 22GX1" 22G X 1" 3 ML MISC USE AS INSTRUCTED FOR TESTOSTERONE INJECTION EVERY 2 WEEKS 10 each 2   bisoprolol (ZEBETA) 5 MG tablet Take 1 tablet (5 mg total) by mouth daily. 30 tablet 6   Black Pepper-Turmeric (TURMERIC COMPLEX/BLACK PEPPER PO) Take 1 tablet by mouth in the morning and at bedtime.     blood glucose meter kit and supplies KIT Dispense based on patient and insurance preference. Use up to four times daily as directed. 1 each 0   chlorpheniramine-HYDROcodone (TUSSIONEX) 10-8 MG/5ML SUER Take 5 mLs by mouth at bedtime as needed for cough. 473 mL 0   esomeprazole (NEXIUM) 20 MG capsule Take 20 mg by mouth in the morning.     gabapentin (NEURONTIN) 300 MG capsule Take 300 mg by mouth at bedtime.     glipiZIDE (GLUCOTROL) 10 MG tablet TAKE 1 TABLET (10 MG TOTAL) BY MOUTH 2 (TWO) TIMES DAILY BEFORE A MEAL. FOR DIABETES. 180 tablet 3   Insulin Glargine (BASAGLAR KWIKPEN) 100 UNIT/ML Inject 20 Units into the skin at bedtime. For diabetes. 30 mL 1   Insulin Pen Needle (BD PEN NEEDLE NANO U/F) 32G X 4 MM MISC Use with insulin as prescribed  Dx Code: E11.9 100 each 2   ipratropium-albuterol (DUONEB) 0.5-2.5 (3) MG/3ML SOLN Take 3 mLs by nebulization every 4 (four) hours while awake for 3 days, THEN 3 mLs every 4 (four) hours as needed (shortness of breath or wheezing). 360 mL 0   Lancets MISC USE UP TO 3 TIMES DAILY AS DIRECTED 100 each 2   losartan (COZAAR) 50 MG tablet Take 1 tablet (50 mg total) by mouth daily. For blood pressure. 90 tablet 3   Melatonin 10 MG CAPS Take 10 mg by mouth at bedtime.     metFORMIN (GLUCOPHAGE) 1000 MG tablet TAKE 1 TABLET (1,000 MG TOTAL) BY MOUTH 2 (TWO) TIMES DAILY WITH A MEAL. FOR DIABETES 180 tablet 1   Multiple Vitamin (MULTI-VITAMINS) TABS Take 1 tablet by mouth in the morning.      Omega-3 Fatty Acids (SALMON OIL PO) Take 1 capsule by mouth daily.     ONETOUCH ULTRA test strip USE UP TO 4 TIMES DAILY AS DIRECTED 100 strip 5   oxyCODONE-acetaminophen (PERCOCET) 5-325 MG tablet Take 1-2 tablets by mouth every 4 (four) hours as needed for severe pain. 20 tablet 0   oxymetazoline (AFRIN) 0.05 % nasal spray Place 1 spray into both nostrils daily.     pioglitazone (ACTOS) 45 MG tablet TAKE 1 TABLET (45 MG TOTAL) BY MOUTH DAILY. FOR DIABETES. 90 tablet 0   pravastatin (PRAVACHOL) 40 MG tablet TAKE 1 TABLET BY MOUTH EVERY DAY IN THE EVENING FOR CHOLESTEROL 90 tablet 1   testosterone cypionate (DEPOTESTOSTERONE CYPIONATE) 200 MG/ML injection Inject 200 mg into the muscle  every 14 (fourteen) days.     No current facility-administered medications for this visit.   Facility-Administered Medications Ordered in Other Visits  Medication Dose Route Frequency Provider Last Rate Last Admin   atropine injection 0.5 mg  0.5 mg Intravenous Once PRN Curt Bears, MD       heparin lock flush 100 unit/mL  500 Units Intracatheter Once PRN Curt Bears, MD       irinotecan (CAMPTOSAR) 160 mg in sodium chloride 0.9 % 500 mL chemo infusion  65 mg/m2 (Treatment Plan Recorded) Intravenous Once Curt Bears, MD       sodium chloride flush (NS) 0.9 % injection 10 mL  10 mL Intracatheter PRN Curt Bears, MD        SURGICAL HISTORY:  Past Surgical History:  Procedure Laterality Date   CHEST TUBE INSERTION Right 01/20/2022   Procedure: CHEST TUBE INSERTION;  Surgeon: Candee Furbish, MD;  Location[facility] ENDOSCOPY;  Service: Pulmonary;  Laterality: Right;  w/ Talc Pleurodesis, planned admit for Obs afterwards   COLONOSCOPY WITH PROPOFOL N/A 12/17/2018   Procedure: COLONOSCOPY WITH PROPOFOL;  Surgeon: Virgel Manifold, MD;  Location: [facility] ENDOSCOPY;  Service: Gastroenterology;  Laterality: N/A;   IR IMAGING GUIDED PORT INSERTION  04/17/2020   IR THORACENTESIS ASP PLEURAL SPACE W/IMG  GUIDE  11/29/2021   IR THORACENTESIS ASP PLEURAL SPACE W/IMG GUIDE  01/03/2022   JOINT REPLACEMENT Bilateral    REPLACEMENT TOTAL KNEE BILATERAL  2015   TONSILLECTOMY  1960   TRANSTHORACIC ECHOCARDIOGRAM  05/2020   a) 05/2020: EF 55 to 60%.  No R WMA.  Mild LVH.  Indeterminate LVEDP.  Unable to assess RVP.  Normal aortic and mitral valves.  Mildly elevated RAP.; b) 09/2021: EF 50-55%. No RWMA. Mild LVH. ~ LVEDP. Mild LA Dilation. NORMAL RV/RAP.  Normal MV/AoV.    REVIEW OF SYSTEMS:  A comprehensive review of systems was negative except for: Constitutional: positive for fatigue Respiratory: positive for cough and dyspnea on exertion Genitourinary: positive for dysuria   PHYSICAL EXAMINATION: General appearance: alert, cooperative, fatigued, and no distress Head: Normocephalic, without obvious abnormality, atraumatic Neck: no adenopathy, no JVD, supple, symmetrical, trachea midline, and thyroid not enlarged, symmetric, no tenderness/mass/nodules Lymph nodes: Cervical, supraclavicular, and axillary nodes normal. Resp: clear to auscultation bilaterally Back: symmetric, no curvature. ROM normal. No CVA tenderness. Cardio: regular rate and rhythm, S1, S2 normal, no murmur, click, rub or gallop GI: soft, non-tender; bowel sounds normal; no masses,  no organomegaly Extremities: extremities normal, atraumatic, no cyanosis or edema  ECOG PERFORMANCE STATUS: 1 - Symptomatic but completely ambulatory  There were no vitals taken for this visit.  LABORATORY DATA: Lab Results  Component Value Date   WBC 6.5 02/24/2022   HGB 10.9 (L) 02/24/2022   HCT 33.0 (L) 02/24/2022   MCV 88.9 02/24/2022   PLT 202 02/24/2022      Chemistry      Component Value Date/Time   NA 138 02/24/2022 1355   K 4.4 02/24/2022 1355   CL 103 02/24/2022 1355   CO2 29 02/24/2022 1355   BUN 23 02/24/2022 1355   CREATININE 1.18 02/24/2022 1355   CREATININE 1.45 (H) 10/18/2021 1533      Component Value Date/Time    CALCIUM 8.8 (L) 02/24/2022 1355   ALKPHOS 60 02/24/2022 1355   AST 15 02/24/2022 1355   ALT 16 02/24/2022 1355   BILITOT 0.4 02/24/2022 1355       RADIOGRAPHIC STUDIES: CT Chest W Contrast  Result  Date: 02/07/2022 CLINICAL DATA:  Primary Cancer Type: Lung Imaging Indication: Assess response to therapy Interval therapy since last imaging? Yes Initial Cancer Diagnosis Date: 04/09/2020; Established by: Biopsy-proven Detailed Pathology: Extensive stage small cell lung cancer. Primary Tumor location:  Right upper lobe. Metastatic liver lesions. Surgeries: No. Chemotherapy: Yes; Ongoing? Yes; Zepzelca; Most recent administration: 01/23/2022 Immunotherapy?  Yes; Type: Imfinzi, Tecentriq; Ongoing? No Radiation therapy? Yes; Date Range: 04/12/2020 - 05/02/2020; Target: Right lung Other Cancer Therapies: Talc pleurodesis for recurrent pleural effusion on 01/20/2022. EXAM: CT CHEST, ABDOMEN, AND PELVIS WITH CONTRAST TECHNIQUE: Multidetector CT imaging of the chest, abdomen and pelvis was performed following the standard protocol during bolus administration of intravenous contrast. RADIATION DOSE REDUCTION: This exam was performed according to the departmental dose-optimization program which includes automated exposure control, adjustment of the mA and/or kV according to patient size and/or use of iterative reconstruction technique. CONTRAST:  146m OMNIPAQUE IOHEXOL 300 MG/ML  SOLN COMPARISON:  Most recent CT chest, abdomen and pelvis 11/27/2021. 03/28/2020 PET-CT. FINDINGS: CT CHEST FINDINGS Cardiovascular: Calcified and noncalcified atheromatous plaque in the thoracic aorta. No aneurysmal dilation. Normal heart size. No substantial pericardial effusion. Central pulmonary vasculature is unremarkable on venous phase. Mediastinum/Nodes: RIGHT-sided Port-A-Cath terminates at the caval to atrial junction. No signs of adenopathy in the chest with stable subtle soft tissue about the low RIGHT paratracheal chain tracking  anterior to RIGHT mainstem bronchus measuring 14 mm. Scattered small lymph nodes throughout the remainder of the chest without change Lungs/Pleura: Slight decrease in a small RIGHT-sided effusion. Pleural nodularity over the RIGHT hemidiaphragm in the lateral and inferior costodiaphragmatic sulcus (image 52/2) rind of pleural nodularity measuring 6 mm, quite subtle perhaps slightly increase from previous imaging best seen in coronal difficult to assess as a discrete measurable lesion. Stable appearance of volume loss and bronchiectatic changes extending from the hilum into the RIGHT upper lobe. Stable nodular area in the RIGHT upper lobe (image 135/6) 11 mm Airways LEFT chest are patent. No suspicious nodules in the LEFT chest. Musculoskeletal: No acute findings about the bony thorax. Spinal degenerative changes. No destructive bone process. See below for full musculoskeletal details. CT ABDOMEN PELVIS FINDINGS Hepatobiliary: Hepatic metastatic lesions show some increased size. Lesion along the anterior RIGHT hemi liver (image 60/2) 22 mm as compared to 16 mm. (Image 56/2) 19 mm caudate lesion previously 13 mm. (Image 55/2) low-density lesion inferolateral segment LEFT hepatic lobe 8 mm as compared to 13 mm, perhaps slightly diminished in size. (Image 48/2) 22 mm hepatic subsegment VII/VIII lesion near the dome of the RIGHT hemi liver previously approximately 7 mm. Portal vein is patent. No pericholecystic stranding or biliary duct distension. Pancreas: Normal, without mass, inflammation or ductal dilatation. Spleen: Normal. Adrenals/Urinary Tract: RIGHT adrenal nodule is stable measuring 12-13 mm. Symmetric renal enhancement. No sign of hydronephrosis. No suspicious renal lesion or perinephric stranding. Urinary bladder is grossly unremarkable. Cysts about the RIGHT kidney without change, dominant cyst measuring up to 6.9 x 5.6 cm extends partially into the renal sinus. Stomach/Bowel: No signs of bowel inflammation  or focal bowel wall thickening. Moderate duodenal diverticulum with similar appearance. Normal appendix. Formed stool throughout much of the colon without substantial distension. Vascular/Lymphatic: Aortic atherosclerosis. No sign of aneurysm. Smooth contour of the IVC. Periportal lymph node with enlargement (image 60/2) 19 mm short axis previously 12 mm. No hepatic gastric adenopathy or additional adenopathy in the abdomen. No pelvic sidewall lymphadenopathy. Reproductive: Moderate prostatomegaly, essentially unremarkable by CT. Other: No ascites. Musculoskeletal: No acute  bone finding. No destructive bone process. Spinal degenerative changes. IMPRESSION: 1. Slight decrease in a small RIGHT-sided effusion. 2. Pleural nodularity over the RIGHT hemidiaphragm in the lateral and inferior costodiaphragmatic sulcus, quite subtle perhaps slightly increase from previous imaging. Attention on follow-up. 3. Stable appearance of volume loss and bronchiectatic changes extending from the hilum into the RIGHT upper lobe. 4. Increased size of hepatic metastatic lesions. 5. Increasing size of a periportal lymph node suspicious for nodal involvement in the abdomen. 6. Stable RIGHT adrenal nodule. 7. Aortic atherosclerosis. Aortic Atherosclerosis (ICD10-I70.0). Electronically Signed   By: Zetta Bills M.D.   On: 02/07/2022 10:42   CT Abdomen Pelvis W Contrast  Result Date: 02/07/2022 CLINICAL DATA:  Primary Cancer Type: Lung Imaging Indication: Assess response to therapy Interval therapy since last imaging? Yes Initial Cancer Diagnosis Date: 04/09/2020; Established by: Biopsy-proven Detailed Pathology: Extensive stage small cell lung cancer. Primary Tumor location:  Right upper lobe. Metastatic liver lesions. Surgeries: No. Chemotherapy: Yes; Ongoing? Yes; Zepzelca; Most recent administration: 01/23/2022 Immunotherapy?  Yes; Type: Imfinzi, Tecentriq; Ongoing? No Radiation therapy? Yes; Date Range: 04/12/2020 - 05/02/2020;  Target: Right lung Other Cancer Therapies: Talc pleurodesis for recurrent pleural effusion on 01/20/2022. EXAM: CT CHEST, ABDOMEN, AND PELVIS WITH CONTRAST TECHNIQUE: Multidetector CT imaging of the chest, abdomen and pelvis was performed following the standard protocol during bolus administration of intravenous contrast. RADIATION DOSE REDUCTION: This exam was performed according to the departmental dose-optimization program which includes automated exposure control, adjustment of the mA and/or kV according to patient size and/or use of iterative reconstruction technique. CONTRAST:  157m OMNIPAQUE IOHEXOL 300 MG/ML  SOLN COMPARISON:  Most recent CT chest, abdomen and pelvis 11/27/2021. 03/28/2020 PET-CT. FINDINGS: CT CHEST FINDINGS Cardiovascular: Calcified and noncalcified atheromatous plaque in the thoracic aorta. No aneurysmal dilation. Normal heart size. No substantial pericardial effusion. Central pulmonary vasculature is unremarkable on venous phase. Mediastinum/Nodes: RIGHT-sided Port-A-Cath terminates at the caval to atrial junction. No signs of adenopathy in the chest with stable subtle soft tissue about the low RIGHT paratracheal chain tracking anterior to RIGHT mainstem bronchus measuring 14 mm. Scattered small lymph nodes throughout the remainder of the chest without change Lungs/Pleura: Slight decrease in a small RIGHT-sided effusion. Pleural nodularity over the RIGHT hemidiaphragm in the lateral and inferior costodiaphragmatic sulcus (image 52/2) rind of pleural nodularity measuring 6 mm, quite subtle perhaps slightly increase from previous imaging best seen in coronal difficult to assess as a discrete measurable lesion. Stable appearance of volume loss and bronchiectatic changes extending from the hilum into the RIGHT upper lobe. Stable nodular area in the RIGHT upper lobe (image 135/6) 11 mm Airways LEFT chest are patent. No suspicious nodules in the LEFT chest. Musculoskeletal: No acute findings  about the bony thorax. Spinal degenerative changes. No destructive bone process. See below for full musculoskeletal details. CT ABDOMEN PELVIS FINDINGS Hepatobiliary: Hepatic metastatic lesions show some increased size. Lesion along the anterior RIGHT hemi liver (image 60/2) 22 mm as compared to 16 mm. (Image 56/2) 19 mm caudate lesion previously 13 mm. (Image 55/2) low-density lesion inferolateral segment LEFT hepatic lobe 8 mm as compared to 13 mm, perhaps slightly diminished in size. (Image 48/2) 22 mm hepatic subsegment VII/VIII lesion near the dome of the RIGHT hemi liver previously approximately 7 mm. Portal vein is patent. No pericholecystic stranding or biliary duct distension. Pancreas: Normal, without mass, inflammation or ductal dilatation. Spleen: Normal. Adrenals/Urinary Tract: RIGHT adrenal nodule is stable measuring 12-13 mm. Symmetric renal enhancement. No sign of  hydronephrosis. No suspicious renal lesion or perinephric stranding. Urinary bladder is grossly unremarkable. Cysts about the RIGHT kidney without change, dominant cyst measuring up to 6.9 x 5.6 cm extends partially into the renal sinus. Stomach/Bowel: No signs of bowel inflammation or focal bowel wall thickening. Moderate duodenal diverticulum with similar appearance. Normal appendix. Formed stool throughout much of the colon without substantial distension. Vascular/Lymphatic: Aortic atherosclerosis. No sign of aneurysm. Smooth contour of the IVC. Periportal lymph node with enlargement (image 60/2) 19 mm short axis previously 12 mm. No hepatic gastric adenopathy or additional adenopathy in the abdomen. No pelvic sidewall lymphadenopathy. Reproductive: Moderate prostatomegaly, essentially unremarkable by CT. Other: No ascites. Musculoskeletal: No acute bone finding. No destructive bone process. Spinal degenerative changes. IMPRESSION: 1. Slight decrease in a small RIGHT-sided effusion. 2. Pleural nodularity over the RIGHT hemidiaphragm in  the lateral and inferior costodiaphragmatic sulcus, quite subtle perhaps slightly increase from previous imaging. Attention on follow-up. 3. Stable appearance of volume loss and bronchiectatic changes extending from the hilum into the RIGHT upper lobe. 4. Increased size of hepatic metastatic lesions. 5. Increasing size of a periportal lymph node suspicious for nodal involvement in the abdomen. 6. Stable RIGHT adrenal nodule. 7. Aortic atherosclerosis. Aortic Atherosclerosis (ICD10-I70.0). Electronically Signed   By: Zetta Bills M.D.   On: 02/07/2022 10:42    ASSESSMENT AND PLAN: This is a very pleasant 71 year old Caucasian male diagnosed with extensive stage (T4, N3, M1C) small cell lung cancer presented with large right upper lobe lung mass in addition to mediastinal and right supraclavicular lymphadenopathy and multiple metastatic liver lesions diagnosed in April 2021. The patient initially underwent systemic chemotherapy with carboplatin for an AUC of 5 on day 1, etoposide 100 mg per metered squared on days 1, 2, and 3 in addition to [facility] for myeloprotection.  He also received immunotherapy with Imfinzi on day 1 of every chemotherapy cycle.  He is status post 9 cycles.  Starting from cycle #5 he was on single agent immunotherapy with Imfinzi IV every 4 weeks.  The patient had evidence of disease progression on his scan from December 2021.  He was then started on systemic chemotherapy with carboplatin for an AUC of 5 on day 1, etoposide 100 mg per metered squared on days 1, 2, and 3 in addition to Tecentriq 1200 mg on day 1, the patient is status post 9 cycles.  Starting from cycle #5, the patient started maintenance immunotherapy with Tecentriq.  This was discontinued due to evidence of disease progression. The patient started third line treatment with Zepzelca (lurbinectedin) 3.2 Mg/M2 every 3-week status post 12 cycles of this treatment and he has been tolerating it fairly well except for fatigue.   This treatment was discontinued secondary to disease progression. He is currently undergoing first-line treatment with systemic chemotherapy with irinotecan 65 Mg/M2 on days 1 and 8 every 3 weeks.  He is status post day 1 of cycle #1.  He tolerated the first week of his treatment fairly well with no concerning adverse effects. I recommended for the patient to proceed with day 8 of cycle #1 today as planned. For the dysuria, I will arrange for the patient to have UA to rule out urinary tract infection. He will come back for follow-up visit in 2 weeks for evaluation before starting day 1 of cycle #2. The patient was advised to call immediately if he has any other concerning symptoms in the interval. The patient voices understanding of current disease status and treatment  options and is in agreement with the current care plan.  All questions were answered. The patient knows to call the clinic with any problems, questions or concerns. We can certainly see the patient much sooner if necessary.  The total time spent in the appointment was 20 minutes.  Disclaimer: This note was dictated with voice recognition software. Similar sounding words can inadvertently be transcribed and may not be corrected upon review. Eilleen Kempf, MD 02/24/22

## 2022-02-25 ENCOUNTER — Inpatient Hospital Stay: Payer: Medicare Other | Admitting: Internal Medicine

## 2022-02-25 ENCOUNTER — Inpatient Hospital Stay: Payer: Medicare Other

## 2022-02-25 DIAGNOSIS — Z20822 Contact with and (suspected) exposure to covid-19: Secondary | ICD-10-CM | POA: Diagnosis not present

## 2022-02-28 ENCOUNTER — Other Ambulatory Visit: Payer: Self-pay | Admitting: Physician Assistant

## 2022-02-28 DIAGNOSIS — I2692 Saddle embolus of pulmonary artery without acute cor pulmonale: Secondary | ICD-10-CM

## 2022-03-01 DIAGNOSIS — Z20822 Contact with and (suspected) exposure to covid-19: Secondary | ICD-10-CM | POA: Diagnosis not present

## 2022-03-04 ENCOUNTER — Ambulatory Visit: Payer: Medicare Other

## 2022-03-04 ENCOUNTER — Other Ambulatory Visit: Payer: Medicare Other

## 2022-03-04 ENCOUNTER — Ambulatory Visit: Payer: Medicare Other | Admitting: Internal Medicine

## 2022-03-07 NOTE — Progress Notes (Signed)
[place] [facility] ?OFFICE PROGRESS NOTE ? ?[name], NP ?[place] [address] ? ?DIAGNOSIS: Relapsed extensive stage (T4, N3, M1c)  small cell lung cancer diagnosed in April 2021 and presented with large right upper lobe/right hilar mass with ipsilateral and contralateral mediastinal and right supraclavicular lymphadenopathy in addition to multiple liver lesions. He has disease progression after the first line of chemotherapy in December 2021. ? ?PRIOR THERAPY: ?1) Palliative radiotherapy to the right upper lobe lung mass under the care of Dr. Lisbeth Renshaw. ?2) Systemic chemotherapy with carboplatin for AUC of 5 on day 1, etoposide 100 mg/M2 on days 1, 2 and 3 in addition to Imfinzi 1500 mg IV every 3 weeks with chemotherapy then every 4 weeks for maintenance if the patient has no evidence for progression.  He will also receive Cosela 240 mg/m2 on the days of the chemotherapy.  Status post 9 cycles.  Starting from cycle #5 the patient will be on maintenance treatment with immunotherapy with Imfinzi 1500 mg IV every 4 weeks. Last dose of chemotherapy was given on November 13, 2020. This treatment was discontinued secondary to disease progression ?3) Systemic chemotherapy with carboplatin for AUC of 5 on day 1, etoposide 100 mg/M2 on days 1, 2 and 3, Tecentriq 1200 mg IV every 3 weeks as well as Cosela 250 mg/M2 on the days of the chemotherapy every 3 weeks.  First dose December 18, 2020.  Status post 8 cycles. ?4) Zepzelca 3.2 mgm2 IV every 3 weeks. Last dose on 01/23/22. Status post 12 cycles.  ? ?CURRENT THERAPY:  Palliative systemic chemotherapy with irinotecan 65 mg per metered squared on days 1 and 8 IV every 3 weeks.  Status post 1 cycle. ? ?INTERVAL HISTORY: ?Adam Wise 71 y.o. male returns to the clinic today for a follow-up visit accompanied by his wife. The patient is feeling fairly well today without any concerning complaints. The patient was recently found to have disease  progression and his treatment was switched to irinotecan.  He is status post 1 cycle and tolerated it well without any adverse side effects.  He denies any fever, chills, or unexplained weight loss.  He had night sweats the night of his last infusion due to the steroids. He reports his baseline dyspnea on exertion but overall his breathing is reportedly significantly improved since his talc pleurodesis. The patient is very pleased with the results of the procedure and feels like he can take a deep breath, which he was unable to do prior. He follows closely with pulmonology regarding his recurrent pleural effusions.  They are monitoring for now and he knows to call us if he becomes more symptomatic.  He denies any chest pain or hemoptysis.  He denies any nausea, vomiting, diarrhea, or constipation.  Denies any headache or visual changes.  The patient is here today for evaluation and repeat blood work before undergoing day 1 cycle 2. ? ?MEDICAL HISTORY: ?Past Medical History:  ?Diagnosis Date  ? Chickenpox   ? Chronic knee pain   ? Chronic low back pain   ? Coronary artery calcification seen on CAT scan 11/2021  ? Coronary CTA 11/27/2021: Moderate to large right pleural effusion and compressive atelectasis right lung base. => Coronary Calcium Score 657.  Diffuse RCA calcification.  Minimal mild disease in the LAD and diagonal branches. == Overall limited study.  Notable artifact.  ? Essential hypertension   ? GERD (gastroesophageal reflux disease)   ? Hyperlipidemia   ?  OSA (obstructive sleep apnea)   ? With nighttime oxygen supplementation  ? 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 large right upper lobe/right hilar mass with ipsilateral and contralateral mediastinal and right supraclavicular lymphadenopathy in addition to multiple liver lesios. He has disease progression after the first line of chemotherapy in December 2021.  ? Testosterone deficiency   ? Type 2 diabetes mellitus ([place])    ? ? ?ALLERGIES:  is allergic to bupropion and hydrochlorothiazide. ? ?MEDICATIONS:  ?Current Outpatient Medications  ?Medication Sig Dispense Refill  ? albuterol (VENTOLIN HFA) 108 (90 Base) MCG/ACT inhaler Inhale 2 puffs into the lungs every 6 (six) hours as needed for wheezing or shortness of breath. 18 g 0  ? amLODipine (NORVASC) 10 MG tablet TAKE 1 TABLET BY MOUTH EVERY DAY FOR BLOOD PRESSURE 90 tablet 3  ? B-D 3CC LUER-LOK SYR 22GX1" 22G X 1" 3 ML MISC USE AS INSTRUCTED FOR TESTOSTERONE INJECTION EVERY 2 WEEKS 10 each 2  ? bisoprolol (ZEBETA) 5 MG tablet Take 1 tablet (5 mg total) by mouth daily. 30 tablet 6  ? Black Pepper-Turmeric (TURMERIC COMPLEX/BLACK PEPPER PO) Take 1 tablet by mouth in the morning and at bedtime.    ? blood glucose meter kit and supplies KIT Dispense based on patient and insurance preference. Use up to four times daily as directed. 1 each 0  ? chlorpheniramine-HYDROcodone (TUSSIONEX) 10-8 MG/5ML SUER Take 5 mLs by mouth at bedtime as needed for cough. 473 mL 0  ? ELIQUIS 5 MG TABS tablet TAKE 1 TABLET BY MOUTH TWICE A DAY 180 tablet 2  ? esomeprazole (NEXIUM) 20 MG capsule Take 20 mg by mouth in the morning.    ? gabapentin (NEURONTIN) 300 MG capsule Take 300 mg by mouth at bedtime.    ? glipiZIDE (GLUCOTROL) 10 MG tablet TAKE 1 TABLET (10 MG TOTAL) BY MOUTH 2 (TWO) TIMES DAILY BEFORE A MEAL. FOR DIABETES. 180 tablet 3  ? Insulin Glargine (BASAGLAR KWIKPEN) 100 UNIT/ML Inject 20 Units into the skin at bedtime. For diabetes. 30 mL 1  ? Insulin Pen Needle (BD PEN NEEDLE NANO U/F) 32G X 4 MM MISC Use with insulin as prescribed ? ?Dx Code: E11.9 100 each 2  ? ipratropium-albuterol (DUONEB) 0.5-2.5 (3) MG/3ML SOLN Take 3 mLs by nebulization every 4 (four) hours while awake for 3 days, THEN 3 mLs every 4 (four) hours as needed (shortness of breath or wheezing). 360 mL 0  ? Lancets MISC USE UP TO 3 TIMES DAILY AS DIRECTED 100 each 2  ? losartan (COZAAR) 50 MG tablet Take 1 tablet (50 mg total)  by mouth daily. For blood pressure. 90 tablet 3  ? Melatonin 10 MG CAPS Take 10 mg by mouth at bedtime.    ? metFORMIN (GLUCOPHAGE) 1000 MG tablet TAKE 1 TABLET (1,000 MG TOTAL) BY MOUTH 2 (TWO) TIMES DAILY WITH A MEAL. FOR DIABETES 180 tablet 1  ? Multiple Vitamin (MULTI-VITAMINS) TABS Take 1 tablet by mouth in the morning.    ? Omega-3 Fatty Acids (SALMON OIL PO) Take 1 capsule by mouth daily.    ? ONETOUCH ULTRA test strip USE UP TO 4 TIMES DAILY AS DIRECTED 100 strip 5  ? oxyCODONE-acetaminophen (PERCOCET) 5-325 MG tablet Take 1-2 tablets by mouth every 4 (four) hours as needed for severe pain. 20 tablet 0  ? oxymetazoline (AFRIN) 0.05 % nasal spray Place 1 spray into both nostrils daily.    ? pioglitazone (ACTOS) 45 MG  tablet TAKE 1 TABLET (45 MG TOTAL) BY MOUTH DAILY. FOR DIABETES. 90 tablet 1  ? pravastatin (PRAVACHOL) 40 MG tablet TAKE 1 TABLET BY MOUTH EVERY DAY IN THE EVENING FOR CHOLESTEROL 90 tablet 1  ? sulfamethoxazole-trimethoprim (BACTRIM DS) 800-160 MG tablet Take 1 tablet by mouth 2 (two) times daily. 6 tablet 0  ? testosterone cypionate (DEPOTESTOSTERONE CYPIONATE) 200 MG/ML injection Inject 200 mg into the muscle every 14 (fourteen) days.    ? ?No current facility-administered medications for this visit.  ? ? ?SURGICAL HISTORY:  ?Past Surgical History:  ?Procedure Laterality Date  ? CHEST TUBE INSERTION Right 01/20/2022  ? Procedure: CHEST TUBE INSERTION;  Surgeon: Candee Furbish, MD;  Location: [facility] ENDOSCOPY;  Service: Pulmonary;  Laterality: Right;  w/ Talc Pleurodesis, planned admit for Obs afterwards  ? COLONOSCOPY WITH PROPOFOL N/A 12/17/2018  ? Procedure: COLONOSCOPY WITH PROPOFOL;  Surgeon: Virgel Manifold, MD;  Location: [facility] ENDOSCOPY;  Service: Gastroenterology;  Laterality: N/A;  ? IR IMAGING GUIDED PORT INSERTION  04/17/2020  ? IR THORACENTESIS ASP PLEURAL SPACE W/IMG GUIDE  11/29/2021  ? IR THORACENTESIS ASP PLEURAL SPACE W/IMG GUIDE  01/03/2022  ? JOINT REPLACEMENT Bilateral   ?  REPLACEMENT TOTAL KNEE BILATERAL  2015  ? TONSILLECTOMY  1960  ? TRANSTHORACIC ECHOCARDIOGRAM  05/2020  ? a) 05/2020: EF 55 to 60%.  No R WMA.  Mild LVH.  Indeterminate LVEDP.  Unable to assess RVP.  Normal aortic and

## 2022-03-08 ENCOUNTER — Other Ambulatory Visit: Payer: Self-pay | Admitting: Primary Care

## 2022-03-08 DIAGNOSIS — E785 Hyperlipidemia, unspecified: Secondary | ICD-10-CM

## 2022-03-08 DIAGNOSIS — E1169 Type 2 diabetes mellitus with other specified complication: Secondary | ICD-10-CM

## 2022-03-08 DIAGNOSIS — E119 Type 2 diabetes mellitus without complications: Secondary | ICD-10-CM

## 2022-03-11 ENCOUNTER — Inpatient Hospital Stay (HOSPITAL_BASED_OUTPATIENT_CLINIC_OR_DEPARTMENT_OTHER): Payer: Medicare Other | Admitting: Physician Assistant

## 2022-03-11 ENCOUNTER — Inpatient Hospital Stay: Payer: Medicare Other

## 2022-03-11 ENCOUNTER — Other Ambulatory Visit: Payer: Self-pay

## 2022-03-11 VITALS — BP 151/82 | HR 77 | Temp 97.5°F | Resp 16 | Wt 288.9 lb

## 2022-03-11 DIAGNOSIS — Z7901 Long term (current) use of anticoagulants: Secondary | ICD-10-CM | POA: Diagnosis not present

## 2022-03-11 DIAGNOSIS — Z5111 Encounter for antineoplastic chemotherapy: Secondary | ICD-10-CM

## 2022-03-11 DIAGNOSIS — R300 Dysuria: Secondary | ICD-10-CM | POA: Diagnosis not present

## 2022-03-11 DIAGNOSIS — C3411 Malignant neoplasm of upper lobe, right bronchus or lung: Secondary | ICD-10-CM

## 2022-03-11 DIAGNOSIS — Z86711 Personal history of pulmonary embolism: Secondary | ICD-10-CM | POA: Diagnosis not present

## 2022-03-11 DIAGNOSIS — Z95828 Presence of other vascular implants and grafts: Secondary | ICD-10-CM

## 2022-03-11 LAB — CBC WITH DIFFERENTIAL (CANCER CENTER ONLY)
Abs Immature Granulocytes: 0.01 10*3/uL (ref 0.00–0.07)
Basophils Absolute: 0 10*3/uL (ref 0.0–0.1)
Basophils Relative: 1 %
Eosinophils Absolute: 0.3 10*3/uL (ref 0.0–0.5)
Eosinophils Relative: 7 %
HCT: 33.2 % — ABNORMAL LOW (ref 39.0–52.0)
Hemoglobin: 10.8 g/dL — ABNORMAL LOW (ref 13.0–17.0)
Immature Granulocytes: 0 %
Lymphocytes Relative: 12 %
Lymphs Abs: 0.5 10*3/uL — ABNORMAL LOW (ref 0.7–4.0)
MCH: 29.4 pg (ref 26.0–34.0)
MCHC: 32.5 g/dL (ref 30.0–36.0)
MCV: 90.5 fL (ref 80.0–100.0)
Monocytes Absolute: 0.5 10*3/uL (ref 0.1–1.0)
Monocytes Relative: 12 %
Neutro Abs: 2.6 10*3/uL (ref 1.7–7.7)
Neutrophils Relative %: 68 %
Platelet Count: 176 10*3/uL (ref 150–400)
RBC: 3.67 MIL/uL — ABNORMAL LOW (ref 4.22–5.81)
RDW: 16.7 % — ABNORMAL HIGH (ref 11.5–15.5)
WBC Count: 3.9 10*3/uL — ABNORMAL LOW (ref 4.0–10.5)
nRBC: 0 % (ref 0.0–0.2)

## 2022-03-11 LAB — CMP (CANCER CENTER ONLY)
ALT: 17 U/L (ref 0–44)
AST: 17 U/L (ref 15–41)
Albumin: 3.9 g/dL (ref 3.5–5.0)
Alkaline Phosphatase: 60 U/L (ref 38–126)
Anion gap: 5 (ref 5–15)
BUN: 20 mg/dL (ref 8–23)
CO2: 28 mmol/L (ref 22–32)
Calcium: 8.8 mg/dL — ABNORMAL LOW (ref 8.9–10.3)
Chloride: 106 mmol/L (ref 98–111)
Creatinine: 1.06 mg/dL (ref 0.61–1.24)
GFR, Estimated: >60 mL/min (ref >60)
Glucose, Bld: 181 mg/dL — ABNORMAL HIGH (ref 70–99)
Potassium: 4.3 mmol/L (ref 3.5–5.1)
Sodium: 139 mmol/L (ref 135–145)
Total Bilirubin: 0.4 mg/dL (ref 0.3–1.2)
Total Protein: 6.2 g/dL — ABNORMAL LOW (ref 6.5–8.1)

## 2022-03-11 MED ORDER — HEPARIN SOD (PORK) LOCK FLUSH 100 UNIT/ML IV SOLN
500.0000 [IU] | Freq: Once | INTRAVENOUS | Status: AC | PRN
  Administered 2022-03-11: 500 [IU]

## 2022-03-11 MED ORDER — SODIUM CHLORIDE 0.9% FLUSH
10.0000 mL | INTRAVENOUS | Status: DC | PRN
  Administered 2022-03-11: 10 mL

## 2022-03-11 MED ORDER — SODIUM CHLORIDE 0.9 % IV SOLN
65.0000 mg/m2 | Freq: Once | INTRAVENOUS | Status: AC
  Administered 2022-03-11: 160 mg via INTRAVENOUS
  Filled 2022-03-11: qty 8

## 2022-03-11 MED ORDER — SODIUM CHLORIDE 0.9% FLUSH
10.0000 mL | Freq: Once | INTRAVENOUS | Status: AC
  Administered 2022-03-11: 10 mL

## 2022-03-11 MED ORDER — SODIUM CHLORIDE 0.9 % IV SOLN: Freq: Once | INTRAVENOUS | Status: AC

## 2022-03-11 MED ORDER — PALONOSETRON HCL INJECTION 0.25 MG/5ML
0.2500 mg | Freq: Once | INTRAVENOUS | Status: AC
  Administered 2022-03-11: 0.25 mg via INTRAVENOUS
  Filled 2022-03-11: qty 5

## 2022-03-11 MED ORDER — ATROPINE SULFATE 1 MG/ML IV SOLN
0.5000 mg | Freq: Once | INTRAVENOUS | Status: AC | PRN
  Administered 2022-03-11: 0.5 mg via INTRAVENOUS
  Filled 2022-03-11: qty 1

## 2022-03-11 MED ORDER — SODIUM CHLORIDE 0.9 % IV SOLN
10.0000 mg | Freq: Once | INTRAVENOUS | Status: AC
  Administered 2022-03-11: 10 mg via INTRAVENOUS
  Filled 2022-03-11: qty 10

## 2022-03-11 NOTE — Patient Instructions (Signed)
[facility]  Discharge Instructions: ?Thank you for choosing [facility] to provide your oncology and hematology care.  ? ?If you have a lab appointment with the [facility], please go directly to the [facility] and check in at the registration area. ?  ?Wear comfortable clothing and clothing appropriate for easy access to any Portacath or PICC line.  ? ?We strive to give you quality time with your provider. You may need to reschedule your appointment if you arrive late (15 or more minutes).  Arriving late affects you and other patients whose appointments are after yours.  Also, if you miss three or more appointments without notifying the office, you may be dismissed from the clinic at the provider?s discretion.    ?  ?For prescription refill requests, have your pharmacy contact our office and allow 72 hours for refills to be completed.   ? ?Today you received the following chemotherapy and/or immunotherapy agents: Irinotecan    ?  ?To help prevent nausea and vomiting after your treatment, we encourage you to take your nausea medication as directed. ? ?BELOW ARE SYMPTOMS THAT SHOULD BE REPORTED IMMEDIATELY: ?*FEVER GREATER THAN 100.4 F (38 ?C) OR HIGHER ?*CHILLS OR SWEATING ?*NAUSEA AND VOMITING THAT IS NOT CONTROLLED WITH YOUR NAUSEA MEDICATION ?*UNUSUAL SHORTNESS OF BREATH ?*UNUSUAL BRUISING OR BLEEDING ?*URINARY PROBLEMS (pain or burning when urinating, or frequent urination) ?*BOWEL PROBLEMS (unusual diarrhea, constipation, pain near the anus) ?TENDERNESS IN MOUTH AND THROAT WITH OR WITHOUT PRESENCE OF ULCERS (sore throat, sores in mouth, or a toothache) ?UNUSUAL RASH, SWELLING OR PAIN  ?UNUSUAL VAGINAL DISCHARGE OR ITCHING  ? ?Items with * indicate a potential emergency and should be followed up as soon as possible or go to the Emergency Department if any problems should occur. ? ?Please show the CHEMOTHERAPY ALERT CARD or IMMUNOTHERAPY ALERT CARD at check-in to  the Emergency Department and triage nurse. ? ?Should you have questions after your visit or need to cancel or reschedule your appointment, please contact [facility]  Dept: [phone]  and follow the prompts.  Office hours are 8:00 a.m. to 4:30 p.m. Monday - Friday. Please note that voicemails left after 4:00 p.m. may not be returned until the following business day.  We are closed weekends and major holidays. You have access to a nurse at all times for urgent questions. Please call the main number to the clinic Dept: [phone] and follow the prompts. ? ? ?For any non-urgent questions, you may also contact your provider using MyChart. We now offer e-Visits for anyone 22 and older to request care online for non-urgent symptoms. For details visit [email or web address]. ?  ?Also download the MyChart app! Go to the app store, search "MyChart", open the app, select [facility], and log in with your MyChart username and password. ? ?Due to Covid, a mask is required upon entering the hospital/clinic. If you do not have a mask, one will be given to you upon arrival. For doctor visits, patients may have 1 support person aged 31 or older with them. For treatment visits, patients cannot have anyone with them due to current Covid guidelines and our immunocompromised population.  ? ?

## 2022-03-13 ENCOUNTER — Ambulatory Visit: Payer: Medicare Other | Admitting: Pulmonary Disease

## 2022-03-13 DIAGNOSIS — Z20828 Contact with and (suspected) exposure to other viral communicable diseases: Secondary | ICD-10-CM | POA: Diagnosis not present

## 2022-03-14 DIAGNOSIS — Z20822 Contact with and (suspected) exposure to covid-19: Secondary | ICD-10-CM | POA: Diagnosis not present

## 2022-03-15 DIAGNOSIS — Z20822 Contact with and (suspected) exposure to covid-19: Secondary | ICD-10-CM | POA: Diagnosis not present

## 2022-03-17 ENCOUNTER — Other Ambulatory Visit: Payer: Self-pay

## 2022-03-17 DIAGNOSIS — C3411 Malignant neoplasm of upper lobe, right bronchus or lung: Secondary | ICD-10-CM

## 2022-03-18 ENCOUNTER — Inpatient Hospital Stay: Payer: Medicare Other

## 2022-03-18 ENCOUNTER — Other Ambulatory Visit: Payer: Self-pay

## 2022-03-18 ENCOUNTER — Inpatient Hospital Stay: Payer: Medicare Other | Attending: Internal Medicine

## 2022-03-18 VITALS — BP 133/78 | HR 89 | Temp 98.8°F | Resp 18 | Wt 289.0 lb

## 2022-03-18 DIAGNOSIS — C787 Secondary malignant neoplasm of liver and intrahepatic bile duct: Secondary | ICD-10-CM | POA: Diagnosis not present

## 2022-03-18 DIAGNOSIS — Z5111 Encounter for antineoplastic chemotherapy: Secondary | ICD-10-CM | POA: Insufficient documentation

## 2022-03-18 DIAGNOSIS — C3411 Malignant neoplasm of upper lobe, right bronchus or lung: Secondary | ICD-10-CM | POA: Diagnosis not present

## 2022-03-18 DIAGNOSIS — Z95828 Presence of other vascular implants and grafts: Secondary | ICD-10-CM

## 2022-03-18 LAB — CBC WITH DIFFERENTIAL (CANCER CENTER ONLY)
Abs Immature Granulocytes: 0.04 10*3/uL (ref 0.00–0.07)
Basophils Absolute: 0 10*3/uL (ref 0.0–0.1)
Basophils Relative: 1 %
Eosinophils Absolute: 0.2 10*3/uL (ref 0.0–0.5)
Eosinophils Relative: 5 %
HCT: 32.4 % — ABNORMAL LOW (ref 39.0–52.0)
Hemoglobin: 10.5 g/dL — ABNORMAL LOW (ref 13.0–17.0)
Immature Granulocytes: 1 %
Lymphocytes Relative: 15 %
Lymphs Abs: 0.6 10*3/uL — ABNORMAL LOW (ref 0.7–4.0)
MCH: 29.3 pg (ref 26.0–34.0)
MCHC: 32.4 g/dL (ref 30.0–36.0)
MCV: 90.5 fL (ref 80.0–100.0)
Monocytes Absolute: 0.5 10*3/uL (ref 0.1–1.0)
Monocytes Relative: 13 %
Neutro Abs: 2.6 10*3/uL (ref 1.7–7.7)
Neutrophils Relative %: 65 %
Platelet Count: 182 10*3/uL (ref 150–400)
RBC: 3.58 MIL/uL — ABNORMAL LOW (ref 4.22–5.81)
RDW: 16.7 % — ABNORMAL HIGH (ref 11.5–15.5)
WBC Count: 3.9 10*3/uL — ABNORMAL LOW (ref 4.0–10.5)
nRBC: 0 % (ref 0.0–0.2)

## 2022-03-18 LAB — CMP (CANCER CENTER ONLY)
ALT: 17 U/L (ref 0–44)
AST: 19 U/L (ref 15–41)
Albumin: 4 g/dL (ref 3.5–5.0)
Alkaline Phosphatase: 57 U/L (ref 38–126)
Anion gap: 6 (ref 5–15)
BUN: 16 mg/dL (ref 8–23)
CO2: 30 mmol/L (ref 22–32)
Calcium: 9 mg/dL (ref 8.9–10.3)
Chloride: 103 mmol/L (ref 98–111)
Creatinine: 1.14 mg/dL (ref 0.61–1.24)
GFR, Estimated: >60 mL/min (ref >60)
Glucose, Bld: 122 mg/dL — ABNORMAL HIGH (ref 70–99)
Potassium: 4.1 mmol/L (ref 3.5–5.1)
Sodium: 139 mmol/L (ref 135–145)
Total Bilirubin: 0.4 mg/dL (ref 0.3–1.2)
Total Protein: 6.4 g/dL — ABNORMAL LOW (ref 6.5–8.1)

## 2022-03-18 MED ORDER — HEPARIN SOD (PORK) LOCK FLUSH 100 UNIT/ML IV SOLN
500.0000 [IU] | Freq: Once | INTRAVENOUS | Status: AC | PRN
  Administered 2022-03-18: 500 [IU]

## 2022-03-18 MED ORDER — SODIUM CHLORIDE 0.9 % IV SOLN: Freq: Once | INTRAVENOUS | Status: AC

## 2022-03-18 MED ORDER — ATROPINE SULFATE 1 MG/ML IV SOLN
0.5000 mg | Freq: Once | INTRAVENOUS | Status: AC | PRN
  Administered 2022-03-18: 0.5 mg via INTRAVENOUS
  Filled 2022-03-18: qty 1

## 2022-03-18 MED ORDER — PALONOSETRON HCL INJECTION 0.25 MG/5ML
0.2500 mg | Freq: Once | INTRAVENOUS | Status: AC
  Administered 2022-03-18: 0.25 mg via INTRAVENOUS
  Filled 2022-03-18: qty 5

## 2022-03-18 MED ORDER — SODIUM CHLORIDE 0.9% FLUSH
10.0000 mL | INTRAVENOUS | Status: DC | PRN
  Administered 2022-03-18: 10 mL

## 2022-03-18 MED ORDER — SODIUM CHLORIDE 0.9 % IV SOLN
65.0000 mg/m2 | Freq: Once | INTRAVENOUS | Status: AC
  Administered 2022-03-18: 160 mg via INTRAVENOUS
  Filled 2022-03-18: qty 8

## 2022-03-18 MED ORDER — SODIUM CHLORIDE 0.9% FLUSH
10.0000 mL | Freq: Once | INTRAVENOUS | Status: AC
  Administered 2022-03-18: 10 mL

## 2022-03-18 MED ORDER — SODIUM CHLORIDE 0.9 % IV SOLN
10.0000 mg | Freq: Once | INTRAVENOUS | Status: AC
  Administered 2022-03-18: 10 mg via INTRAVENOUS
  Filled 2022-03-18: qty 10

## 2022-03-18 NOTE — Patient Instructions (Signed)
[facility]  Discharge Instructions: ?Thank you for choosing [facility] to provide your oncology and hematology care.  ? ?If you have a lab appointment with the [facility], please go directly to the [facility] and check in at the registration area. ?  ?Wear comfortable clothing and clothing appropriate for easy access to any Portacath or PICC line.  ? ?We strive to give you quality time with your provider. You may need to reschedule your appointment if you arrive late (15 or more minutes).  Arriving late affects you and other patients whose appointments are after yours.  Also, if you miss three or more appointments without notifying the office, you may be dismissed from the clinic at the provider?s discretion.    ?  ?For prescription refill requests, have your pharmacy contact our office and allow 72 hours for refills to be completed.   ? ?Today you received the following chemotherapy and/or immunotherapy agents: Irinotecan    ?  ?To help prevent nausea and vomiting after your treatment, we encourage you to take your nausea medication as directed. ? ?BELOW ARE SYMPTOMS THAT SHOULD BE REPORTED IMMEDIATELY: ?*FEVER GREATER THAN 100.4 F (38 ?C) OR HIGHER ?*CHILLS OR SWEATING ?*NAUSEA AND VOMITING THAT IS NOT CONTROLLED WITH YOUR NAUSEA MEDICATION ?*UNUSUAL SHORTNESS OF BREATH ?*UNUSUAL BRUISING OR BLEEDING ?*URINARY PROBLEMS (pain or burning when urinating, or frequent urination) ?*BOWEL PROBLEMS (unusual diarrhea, constipation, pain near the anus) ?TENDERNESS IN MOUTH AND THROAT WITH OR WITHOUT PRESENCE OF ULCERS (sore throat, sores in mouth, or a toothache) ?UNUSUAL RASH, SWELLING OR PAIN  ?UNUSUAL VAGINAL DISCHARGE OR ITCHING  ? ?Items with * indicate a potential emergency and should be followed up as soon as possible or go to the Emergency Department if any problems should occur. ? ?Please show the CHEMOTHERAPY ALERT CARD or IMMUNOTHERAPY ALERT CARD at check-in to  the Emergency Department and triage nurse. ? ?Should you have questions after your visit or need to cancel or reschedule your appointment, please contact [facility]  Dept: [phone]  and follow the prompts.  Office hours are 8:00 a.m. to 4:30 p.m. Monday - Friday. Please note that voicemails left after 4:00 p.m. may not be returned until the following business day.  We are closed weekends and major holidays. You have access to a nurse at all times for urgent questions. Please call the main number to the clinic Dept: [phone] and follow the prompts. ? ? ?For any non-urgent questions, you may also contact your provider using MyChart. We now offer e-Visits for anyone 43 and older to request care online for non-urgent symptoms. For details visit [email or web address]. ?  ?Also download the MyChart app! Go to the app store, search "MyChart", open the app, select [facility], and log in with your MyChart username and password. ? ?Due to Covid, a mask is required upon entering the hospital/clinic. If you do not have a mask, one will be given to you upon arrival. For doctor visits, patients may have 1 support person aged 36 or older with them. For treatment visits, patients cannot have anyone with them due to current Covid guidelines and our immunocompromised population.  ? ?

## 2022-03-20 ENCOUNTER — Ambulatory Visit: Payer: Medicare Other | Admitting: Cardiology

## 2022-03-25 NOTE — Progress Notes (Signed)
[facility] ?OFFICE PROGRESS NOTE ? ?[name], NP ?[place] [address] ? ?DIAGNOSIS: Relapsed extensive stage (T4, N3, M1c)  small cell lung cancer diagnosed in April 2021 and presented with large right upper lobe/right hilar mass with ipsilateral and contralateral mediastinal and right supraclavicular lymphadenopathy in addition to multiple liver lesions. He has disease progression after the first line of chemotherapy in December 2021. ? ?PRIOR THERAPY: ?1) Palliative radiotherapy to the right upper lobe lung mass under the care of Dr. Lisbeth Renshaw. ?2) Systemic chemotherapy with carboplatin for AUC of 5 on day 1, etoposide 100 mg/M2 on days 1, 2 and 3 in addition to Imfinzi 1500 mg IV every 3 weeks with chemotherapy then every 4 weeks for maintenance if the patient has no evidence for progression.  He will also receive Cosela 240 mg/m2 on the days of the chemotherapy.  Status post 9 cycles.  Starting from cycle #5 the patient will be on maintenance treatment with immunotherapy with Imfinzi 1500 mg IV every 4 weeks. Last dose of chemotherapy was given on November 13, 2020. This treatment was discontinued secondary to disease progression ?3) Systemic chemotherapy with carboplatin for AUC of 5 on day 1, etoposide 100 mg/M2 on days 1, 2 and 3, Tecentriq 1200 mg IV every 3 weeks as well as Cosela 250 mg/M2 on the days of the chemotherapy every 3 weeks.  First dose December 18, 2020.  Status post 8 cycles. ?4) Zepzelca 3.2 mgm2 IV every 3 weeks. Last dose on 01/23/22. Status post 12 cycles ? ?CURRENT THERAPY: Palliative systemic chemotherapy with irinotecan 65 mg per metered squared on days 1 and 8 IV every 3 weeks.  Status post 2 cycles. ? ?INTERVAL HISTORY: ?Hart Haas 71 y.o. male returns  to the clinic today for a follow-up visit accompanied by his wife. The patient is feeling fairly well today without any concerning complaints. He reports some chronic arthritis in his back. He  tapered off gabapentin but may start taking this again. Of note, we will be arranging for a restaging CT scan to be performed before his next visit to restaging his malignancy. The patient was found to have disease progression in February 2023 and his treatment was switched to irinotecan.  He is status post 2 cycles and tolerated it well without any adverse side effects.  He denies any fever, chills, or unexplained weight loss. He reports his baseline dyspnea on exertion but he thinks it may be "a little bit worse". He is not sure if this is from his effusions or some component of allergies. He follows closely from pulmonology and they are monitoring for recurrent effusions. The patient knows not to wait until he feels like he he needs emergent evaluation for his pleural effusions. The patient reports that Dr. Valeta Harms recommended that he stay overnight in the hospital next time he needs a procedure for his hx of pleural effusions. The patient had a talc pleurodesis a few months ago and was very happy with the results of this. He denies any chest pain or hemoptysis. He denies significance cough. He reports allergies and takes benadryl at night to help with allergies and insomnia. He denies any nausea, vomiting, diarrhea, or constipation.  Denies any headache or visual changes.  The patient is here today for evaluation and repeat blood work before undergoing day 1 cycle 3. ? ? ?MEDICAL HISTORY: ?Past Medical History:  ?Diagnosis Date  ? Chickenpox   ? Chronic knee pain   ?  Chronic low back pain   ? Coronary artery calcification seen on CAT scan 11/2021  ? Coronary CTA 11/27/2021: Moderate to large right pleural effusion and compressive atelectasis right lung base. => Coronary Calcium Score 657.  Diffuse RCA calcification.  Minimal mild disease in the LAD and diagonal branches. == Overall limited study.  Notable artifact.  ? Essential hypertension   ? GERD (gastroesophageal reflux disease)   ? Hyperlipidemia   ? OSA  (obstructive sleep apnea)   ? With nighttime oxygen supplementation  ? 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 large right upper lobe/right hilar mass with ipsilateral and contralateral mediastinal and right supraclavicular lymphadenopathy in addition to multiple liver lesios. He has disease progression after the first line of chemotherapy in December 2021.  ? Testosterone deficiency   ? Type 2 diabetes mellitus ([place])   ? ? ?ALLERGIES:  is allergic to bupropion and hydrochlorothiazide. ? ?MEDICATIONS:  ?Current Outpatient Medications  ?Medication Sig Dispense Refill  ? albuterol (VENTOLIN HFA) 108 (90 Base) MCG/ACT inhaler Inhale 2 puffs into the lungs every 6 (six) hours as needed for wheezing or shortness of breath. 18 g 0  ? amLODipine (NORVASC) 10 MG tablet TAKE 1 TABLET BY MOUTH EVERY DAY FOR BLOOD PRESSURE 90 tablet 3  ? B-D 3CC LUER-LOK SYR 22GX1" 22G X 1" 3 ML MISC USE AS INSTRUCTED FOR TESTOSTERONE INJECTION EVERY 2 WEEKS 10 each 2  ? bisoprolol (ZEBETA) 5 MG tablet Take 1 tablet (5 mg total) by mouth daily. 30 tablet 6  ? Black Pepper-Turmeric (TURMERIC COMPLEX/BLACK PEPPER PO) Take 1 tablet by mouth in the morning and at bedtime.    ? blood glucose meter kit and supplies KIT Dispense based on patient and insurance preference. Use up to four times daily as directed. 1 each 0  ? chlorpheniramine-HYDROcodone (TUSSIONEX) 10-8 MG/5ML SUER Take 5 mLs by mouth at bedtime as needed for cough. 473 mL 0  ? ELIQUIS 5 MG TABS tablet TAKE 1 TABLET BY MOUTH TWICE A DAY 180 tablet 2  ? esomeprazole (NEXIUM) 20 MG capsule Take 20 mg by mouth in the morning.    ? gabapentin (NEURONTIN) 300 MG capsule Take 300 mg by mouth at bedtime.    ? glipiZIDE (GLUCOTROL) 10 MG tablet TAKE 1 TABLET (10 MG TOTAL) BY MOUTH 2 (TWO) TIMES DAILY BEFORE A MEAL. FOR DIABETES. 180 tablet 3  ? Insulin Glargine (BASAGLAR KWIKPEN) 100 UNIT/ML Inject 20 Units into the skin at bedtime. For diabetes. 30 mL 1  ? Insulin Pen Needle  (BD PEN NEEDLE NANO U/F) 32G X 4 MM MISC Use with insulin as prescribed ? ?Dx Code: E11.9 100 each 2  ? ipratropium-albuterol (DUONEB) 0.5-2.5 (3) MG/3ML SOLN Take 3 mLs by nebulization every 4 (four) hours while awake for 3 days, THEN 3 mLs every 4 (four) hours as needed (shortness of breath or wheezing). 360 mL 0  ? Lancets MISC USE UP TO 3 TIMES DAILY AS DIRECTED 100 each 2  ? losartan (COZAAR) 50 MG tablet Take 1 tablet (50 mg total) by mouth daily. For blood pressure. 90 tablet 3  ? Melatonin 10 MG CAPS Take 10 mg by mouth at bedtime.    ? metFORMIN (GLUCOPHAGE) 1000 MG tablet TAKE 1 TABLET (1,000 MG TOTAL) BY MOUTH 2 (TWO) TIMES DAILY WITH A MEAL. FOR DIABETES 180 tablet 1  ? Multiple Vitamin (MULTI-VITAMINS) TABS Take 1 tablet by mouth in the morning.    ? Omega-3 Fatty  Acids (SALMON OIL PO) Take 1 capsule by mouth daily.    ? ONETOUCH ULTRA test strip USE UP TO 4 TIMES DAILY AS DIRECTED 100 strip 5  ? oxyCODONE-acetaminophen (PERCOCET) 5-325 MG tablet Take 1-2 tablets by mouth every 4 (four) hours as needed for severe pain. 20 tablet 0  ? oxymetazoline (AFRIN) 0.05 % nasal spray Place 1 spray into both nostrils daily.    ? pioglitazone (ACTOS) 45 MG tablet TAKE 1 TABLET (45 MG TOTAL) BY MOUTH DAILY. FOR DIABETES. 90 tablet 1  ? pravastatin (PRAVACHOL) 40 MG tablet TAKE 1 TABLET BY MOUTH EVERY DAY IN THE EVENING FOR CHOLESTEROL 90 tablet 1  ? sulfamethoxazole-trimethoprim (BACTRIM DS) 800-160 MG tablet Take 1 tablet by mouth 2 (two) times daily. 6 tablet 0  ? testosterone cypionate (DEPOTESTOSTERONE CYPIONATE) 200 MG/ML injection Inject 200 mg into the muscle every 14 (fourteen) days.    ? ?No current facility-administered medications for this visit.  ? ? ?SURGICAL HISTORY:  ?Past Surgical History:  ?Procedure Laterality Date  ? CHEST TUBE INSERTION Right 01/20/2022  ? Procedure: CHEST TUBE INSERTION;  Surgeon: Candee Furbish, MD;  Location: [facility] ENDOSCOPY;  Service: Pulmonary;  Laterality: Right;  w/ Talc  Pleurodesis, planned admit for Obs afterwards  ? COLONOSCOPY WITH PROPOFOL N/A 12/17/2018  ? Procedure: COLONOSCOPY WITH PROPOFOL;  Surgeon: Virgel Manifold, MD;  Location: [facility] ENDOSCOPY;  Service: [facility]

## 2022-03-28 ENCOUNTER — Other Ambulatory Visit: Payer: Self-pay

## 2022-03-31 DIAGNOSIS — Z20822 Contact with and (suspected) exposure to covid-19: Secondary | ICD-10-CM | POA: Diagnosis not present

## 2022-04-01 ENCOUNTER — Inpatient Hospital Stay: Payer: Medicare Other

## 2022-04-01 ENCOUNTER — Encounter: Payer: Self-pay | Admitting: Physician Assistant

## 2022-04-01 ENCOUNTER — Other Ambulatory Visit: Payer: Self-pay

## 2022-04-01 ENCOUNTER — Inpatient Hospital Stay (HOSPITAL_BASED_OUTPATIENT_CLINIC_OR_DEPARTMENT_OTHER): Payer: Medicare Other | Admitting: Physician Assistant

## 2022-04-01 VITALS — BP 152/82 | HR 80

## 2022-04-01 VITALS — BP 143/90 | HR 82 | Temp 97.9°F | Resp 16 | Wt 291.2 lb

## 2022-04-01 DIAGNOSIS — C3411 Malignant neoplasm of upper lobe, right bronchus or lung: Secondary | ICD-10-CM

## 2022-04-01 DIAGNOSIS — Z95828 Presence of other vascular implants and grafts: Secondary | ICD-10-CM

## 2022-04-01 DIAGNOSIS — C787 Secondary malignant neoplasm of liver and intrahepatic bile duct: Secondary | ICD-10-CM | POA: Diagnosis not present

## 2022-04-01 DIAGNOSIS — Z5111 Encounter for antineoplastic chemotherapy: Secondary | ICD-10-CM

## 2022-04-01 LAB — CMP (CANCER CENTER ONLY)
ALT: 14 U/L (ref 0–44)
AST: 14 U/L — ABNORMAL LOW (ref 15–41)
Albumin: 3.8 g/dL (ref 3.5–5.0)
Alkaline Phosphatase: 61 U/L (ref 38–126)
Anion gap: 6 (ref 5–15)
BUN: 15 mg/dL (ref 8–23)
CO2: 28 mmol/L (ref 22–32)
Calcium: 8.7 mg/dL — ABNORMAL LOW (ref 8.9–10.3)
Chloride: 106 mmol/L (ref 98–111)
Creatinine: 1.09 mg/dL (ref 0.61–1.24)
GFR, Estimated: >60 mL/min (ref >60)
Glucose, Bld: 146 mg/dL — ABNORMAL HIGH (ref 70–99)
Potassium: 4.3 mmol/L (ref 3.5–5.1)
Sodium: 140 mmol/L (ref 135–145)
Total Bilirubin: 0.4 mg/dL (ref 0.3–1.2)
Total Protein: 6.2 g/dL — ABNORMAL LOW (ref 6.5–8.1)

## 2022-04-01 LAB — CBC WITH DIFFERENTIAL (CANCER CENTER ONLY)
Abs Immature Granulocytes: 0.01 10*3/uL (ref 0.00–0.07)
Basophils Absolute: 0 10*3/uL (ref 0.0–0.1)
Basophils Relative: 1 %
Eosinophils Absolute: 0.1 10*3/uL (ref 0.0–0.5)
Eosinophils Relative: 3 %
HCT: 35.9 % — ABNORMAL LOW (ref 39.0–52.0)
Hemoglobin: 11.5 g/dL — ABNORMAL LOW (ref 13.0–17.0)
Immature Granulocytes: 0 %
Lymphocytes Relative: 11 %
Lymphs Abs: 0.5 10*3/uL — ABNORMAL LOW (ref 0.7–4.0)
MCH: 29.6 pg (ref 26.0–34.0)
MCHC: 32 g/dL (ref 30.0–36.0)
MCV: 92.5 fL (ref 80.0–100.0)
Monocytes Absolute: 0.5 10*3/uL (ref 0.1–1.0)
Monocytes Relative: 10 %
Neutro Abs: 3.3 10*3/uL (ref 1.7–7.7)
Neutrophils Relative %: 75 %
Platelet Count: 185 10*3/uL (ref 150–400)
RBC: 3.88 MIL/uL — ABNORMAL LOW (ref 4.22–5.81)
RDW: 17.6 % — ABNORMAL HIGH (ref 11.5–15.5)
WBC Count: 4.4 10*3/uL (ref 4.0–10.5)
nRBC: 0 % (ref 0.0–0.2)

## 2022-04-01 MED ORDER — SODIUM CHLORIDE 0.9 % IV SOLN
10.0000 mg | Freq: Once | INTRAVENOUS | Status: AC
  Administered 2022-04-01: 10 mg via INTRAVENOUS
  Filled 2022-04-01: qty 10

## 2022-04-01 MED ORDER — SODIUM CHLORIDE 0.9 % IV SOLN: Freq: Once | INTRAVENOUS | Status: AC

## 2022-04-01 MED ORDER — PALONOSETRON HCL INJECTION 0.25 MG/5ML
0.2500 mg | Freq: Once | INTRAVENOUS | Status: AC
  Administered 2022-04-01: 0.25 mg via INTRAVENOUS
  Filled 2022-04-01: qty 5

## 2022-04-01 MED ORDER — HEPARIN SOD (PORK) LOCK FLUSH 100 UNIT/ML IV SOLN
500.0000 [IU] | Freq: Once | INTRAVENOUS | Status: AC | PRN
  Administered 2022-04-01: 500 [IU]

## 2022-04-01 MED ORDER — SODIUM CHLORIDE 0.9% FLUSH
10.0000 mL | Freq: Once | INTRAVENOUS | Status: AC
  Administered 2022-04-01: 10 mL

## 2022-04-01 MED ORDER — SODIUM CHLORIDE 0.9% FLUSH
10.0000 mL | INTRAVENOUS | Status: DC | PRN
  Administered 2022-04-01: 10 mL

## 2022-04-01 MED ORDER — ATROPINE SULFATE 1 MG/ML IV SOLN
0.5000 mg | Freq: Once | INTRAVENOUS | Status: AC | PRN
  Administered 2022-04-01: 0.5 mg via INTRAVENOUS
  Filled 2022-04-01: qty 1

## 2022-04-01 MED ORDER — SODIUM CHLORIDE 0.9 % IV SOLN
65.0000 mg/m2 | Freq: Once | INTRAVENOUS | Status: AC
  Administered 2022-04-01: 160 mg via INTRAVENOUS
  Filled 2022-04-01: qty 8

## 2022-04-01 NOTE — Patient Instructions (Signed)
[facility]  ? Discharge Instructions: ?Thank you for choosing [facility] to provide your oncology and hematology care.  ? ?If you have a lab appointment with the [facility], please go directly to the [facility] and check in at the registration area. ?  ?Wear comfortable clothing and clothing appropriate for easy access to any Portacath or PICC line.  ? ?We strive to give you quality time with your provider. You may need to reschedule your appointment if you arrive late (15 or more minutes).  Arriving late affects you and other patients whose appointments are after yours.  Also, if you miss three or more appointments without notifying the office, you may be dismissed from the clinic at the provider?s discretion.    ?  ?For prescription refill requests, have your pharmacy contact our office and allow 72 hours for refills to be completed.   ? ?Today you received the following chemotherapy and/or immunotherapy agents: Irinotecan (Camptosar)    ?  ?To help prevent nausea and vomiting after your treatment, we encourage you to take your nausea medication as directed. ? ?BELOW ARE SYMPTOMS THAT SHOULD BE REPORTED IMMEDIATELY: ?*FEVER GREATER THAN 100.4 F (38 ?C) OR HIGHER ?*CHILLS OR SWEATING ?*NAUSEA AND VOMITING THAT IS NOT CONTROLLED WITH YOUR NAUSEA MEDICATION ?*UNUSUAL SHORTNESS OF BREATH ?*UNUSUAL BRUISING OR BLEEDING ?*URINARY PROBLEMS (pain or burning when urinating, or frequent urination) ?*BOWEL PROBLEMS (unusual diarrhea, constipation, pain near the anus) ?TENDERNESS IN MOUTH AND THROAT WITH OR WITHOUT PRESENCE OF ULCERS (sore throat, sores in mouth, or a toothache) ?UNUSUAL RASH, SWELLING OR PAIN  ?UNUSUAL VAGINAL DISCHARGE OR ITCHING  ? ?Items with * indicate a potential emergency and should be followed up as soon as possible or go to the Emergency Department if any problems should occur. ? ?Please show the CHEMOTHERAPY ALERT CARD or IMMUNOTHERAPY ALERT CARD  at check-in to the Emergency Department and triage nurse. ? ?Should you have questions after your visit or need to cancel or reschedule your appointment, please contact [facility]  Dept: [phone]  and follow the prompts.  Office hours are 8:00 a.m. to 4:30 p.m. Monday - Friday. Please note that voicemails left after 4:00 p.m. may not be returned until the following business day.  We are closed weekends and major holidays. You have access to a nurse at all times for urgent questions. Please call the main number to the clinic Dept: [phone] and follow the prompts. ? ? ?For any non-urgent questions, you may also contact your provider using MyChart. We now offer e-Visits for anyone 53 and older to request care online for non-urgent symptoms. For details visit [email or web address]. ?  ?Also download the MyChart app! Go to the app store, search "MyChart", open the app, select , and log in with your MyChart username and password. ? ?Due to Covid, a mask is required upon entering the hospital/clinic. If you do not have a mask, one will be given to you upon arrival. For doctor visits, patients may have 1 support person aged 56 or older with them. For treatment visits, patients cannot have anyone with them due to current Covid guidelines and our immunocompromised population.  ? ?

## 2022-04-02 DIAGNOSIS — M545 Low back pain, unspecified: Secondary | ICD-10-CM

## 2022-04-02 MED ORDER — GABAPENTIN 300 MG PO CAPS: 300.0000 mg | ORAL_CAPSULE | Freq: Two times a day (BID) | ORAL | 1 refills | Status: DC

## 2022-04-09 DIAGNOSIS — Z20822 Contact with and (suspected) exposure to covid-19: Secondary | ICD-10-CM | POA: Diagnosis not present

## 2022-04-10 ENCOUNTER — Encounter: Payer: Self-pay | Admitting: Cardiovascular Disease

## 2022-04-10 ENCOUNTER — Other Ambulatory Visit: Payer: Self-pay

## 2022-04-10 ENCOUNTER — Ambulatory Visit (INDEPENDENT_AMBULATORY_CARE_PROVIDER_SITE_OTHER): Payer: Medicare Other | Admitting: Cardiovascular Disease

## 2022-04-10 VITALS — BP 140/78 | HR 89 | Ht 69.0 in | Wt 289.1 lb

## 2022-04-10 DIAGNOSIS — J90 Pleural effusion, not elsewhere classified: Secondary | ICD-10-CM

## 2022-04-10 DIAGNOSIS — E785 Hyperlipidemia, unspecified: Secondary | ICD-10-CM

## 2022-04-10 DIAGNOSIS — E1165 Type 2 diabetes mellitus with hyperglycemia: Secondary | ICD-10-CM | POA: Diagnosis not present

## 2022-04-10 DIAGNOSIS — R059 Cough, unspecified: Secondary | ICD-10-CM | POA: Diagnosis not present

## 2022-04-10 DIAGNOSIS — J9621 Acute and chronic respiratory failure with hypoxia: Secondary | ICD-10-CM | POA: Diagnosis not present

## 2022-04-10 DIAGNOSIS — Z794 Long term (current) use of insulin: Secondary | ICD-10-CM | POA: Diagnosis not present

## 2022-04-10 DIAGNOSIS — R051 Acute cough: Secondary | ICD-10-CM | POA: Diagnosis not present

## 2022-04-10 DIAGNOSIS — Z20822 Contact with and (suspected) exposure to covid-19: Secondary | ICD-10-CM | POA: Diagnosis not present

## 2022-04-10 DIAGNOSIS — E1169 Type 2 diabetes mellitus with other specified complication: Secondary | ICD-10-CM | POA: Diagnosis not present

## 2022-04-10 DIAGNOSIS — C3411 Malignant neoplasm of upper lobe, right bronchus or lung: Secondary | ICD-10-CM

## 2022-04-10 NOTE — Patient Instructions (Addendum)
Medication Instructions:  ?No changes ? ?If you need a refill on your cardiac medications before your next appointment, please call your pharmacy.  ? ? ?Lab work: ?No new labs needed ? ? ?Testing/Procedures: ?No new testing needed ? ? ?Follow-Up: ?At [facility], you and your health needs are our priority.  As part of our continuing mission to provide you with exceptional heart care, we have created designated Provider Care Teams.  These Care Teams include your primary Cardiologist (physician) and Advanced Practice Providers (APPs -  Physician Assistants and Nurse Practitioners) who all work together to provide you with the care you need, when you need it. ? ?You will need a follow up appointment in 6 months with Dr. Ellyn Hack ? ?Providers on your designated Care Team:   ?Murray Hodgkins, NP ?Christell Faith, PA-C ?Cadence Kathlen Mody, PA-C ? ?COVID-19 Vaccine Information can be found at: [email or web address] For questions related to vaccine distribution or appointments, please email [email or web address] or call [phone].  ? ?

## 2022-04-10 NOTE — Progress Notes (Signed)
Cardiology Office Note ? ?Date:  04/10/2022  ? ?ID:  Adam Wise, DOB 1951/08/27, MRN [id number] ? ?PCP:  [name], NP  ? ?Chief Complaint  ?Patient presents with  ? 3 month follow up   ?  Patient c/o shortness of breath with over exertion. Medications reviewed by the patient verbally.   ? ? ?HPI:  ?Adam Wise is a 71 year old gentleman with history of ?smoking ?Coronary calcification, calcium score 657 ?Essential hypertension ?Pulmonary embolism on Eliquis ?Diabetes type 2 ?Hyperlipidemia ?Pleural effusion, talc pleurodesis 01/2022 ?Small cell lung cancer diagnosed April 2021, chemotherapy, XRT ?Who presents for SOB, pleural effusion, coronary disease ? ?Discussed recent events over the past 2 years ?Presenting to cardiology clinic initially with shortness of breath ?Cardiac CTA with nonobstructive disease ?Aortic atherosclerosis noted ? ?Has had issues with chronic pleural effusion ? ?Diagnosed with small cell lung cancer, he has completed a vigorous treatment as detailed below from oncology ?Systemic chemotherapy with carboplatin for AUC of 5 on day 1, etoposide 100 mg/M2 on days 1, 2 and 3 in addition to Imfinzi 1500 mg IV every 3 weeks with chemotherapy then every 4 weeks for maintenance if the patient has no evidence for progression.  He will also receive Cosela 240 mg/m2 on the days of the chemotherapy.  Status post 9 cycles.  Starting from cycle #5 the patient will be on maintenance treatment with immunotherapy with Imfinzi 1500 mg IV every 4 weeks. Last dose of chemotherapy was given on November 13, 2020. This treatment was discontinued secondary to disease progression ?3) Systemic chemotherapy with carboplatin for AUC of 5 on day 1, etoposide 100 mg/M2 on days 1, 2 and 3, Tecentriq 1200 mg IV every 3 weeks as well as Cosela 250 mg/M2 on the days of the chemotherapy every 3 weeks.  First dose December 18, 2020.  Status post 8 cycles. ?4) Zepzelca 3.2 mgm2 IV every 3 weeks. Last dose on 01/23/22.  Status post 12 cycles ? ?Underwent pleurodesis with talc February 2023, partially successful ?Residual right pleural effusion on CT scan, images pulled up and reviewed ?Followed by pulmonary ?Reports despite recurrence of his effusion he is able to do more, effusion has been stable ? ?Labs reviewed ?A1C 7.0 ? ?EKG personally reviewed by myself on todays visit ?NSR rate 89 bpm, no ST or T wave changes ? 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 ? ?PSH:    ?Past Surgical History:  ?Procedure Laterality Date  ? CHEST TUBE INSERTION Right 01/20/2022  ? Procedure: CHEST TUBE INSERTION;  Surgeon: Candee Furbish, MD;  Location: [facility] ENDOSCOPY;  Service: Pulmonary;  Laterality: Right;  w/ Talc Pleurodesis, planned admit for Obs afterwards  ? COLONOSCOPY WITH PROPOFOL N/A 12/17/2018  ? Procedure: COLONOSCOPY WITH PROPOFOL;  Surgeon: Virgel Manifold, MD;  Location: [facility] ENDOSCOPY;  Service: Gastroenterology;  Laterality: N/A;  ? IR IMAGING GUIDED PORT INSERTION  04/17/2020  ? IR THORACENTESIS ASP PLEURAL SPACE W/IMG GUIDE  11/29/2021  ? IR THORACENTESIS ASP PLEURAL SPACE W/IMG GUIDE  01/03/2022  ? JOINT REPLACEMENT Bilateral   ? REPLACEMENT TOTAL KNEE BILATERAL  2015  ? TONSILLECTOMY  1960  ? TRANSTHORACIC ECHOCARDIOGRAM  05/2020  ? a) 05/2020: EF 55 to 60%.  No R WMA.  Mild LVH.  Indeterminate LVEDP.  Unable to assess RVP.  Normal aortic and mitral valves.  Mildly elevated RAP.; b) 09/2021: EF 50-55%. No RWMA. Mild LVH. ~ LVEDP. Mild LA Dilation. NORMAL RV/RAP.  Normal MV/AoV.  ? ? ?Current Outpatient Medications  ?Medication Sig Dispense Refill  ? albuterol (VENTOLIN HFA) 108 (90 Base)  MCG/ACT inhaler Inhale 2 puffs into the lungs every 6 (six) hours as needed for wheezing or shortness of breath. 18 g 0  ? amLODipine (NORVASC) 10 MG tablet TAKE 1 TABLET BY MOUTH EVERY DAY FOR BLOOD PRESSURE 90 tablet 3  ? B-D 3CC LUER-LOK SYR 22GX1" 22G X 1" 3 ML MISC USE AS INSTRUCTED FOR TESTOSTERONE INJECTION EVERY 2 WEEKS 10 each 2  ? bisoprolol (ZEBETA) 5 MG tablet Take 1 tablet (5 mg total) by mouth daily. 30 tablet 6  ? Black Pepper-Turmeric (TURMERIC COMPLEX/BLACK PEPPER PO) Take 1 tablet by mouth in the morning and at bedtime.    ? blood glucose meter kit and supplies KIT Dispense based on patient and insurance preference. Use up to four times daily as directed. 1 each 0  ? chlorpheniramine-HYDROcodone (TUSSIONEX) 10-8 MG/5ML SUER Take 5 mLs by mouth at bedtime as needed for cough. 473 mL 0  ? ELIQUIS 5 MG TABS tablet TAKE 1 TABLET BY MOUTH TWICE A DAY 180 tablet 2  ? esomeprazole (NEXIUM) 20 MG capsule Take 20 mg by mouth in the morning.    ? gabapentin (NEURONTIN) 300 MG capsule Take 1 capsule (300 mg total) by mouth 2 (two) times daily. For back pain. 180 capsule 1  ? glipiZIDE (GLUCOTROL) 10 MG tablet TAKE 1 TABLET (10 MG TOTAL) BY MOUTH 2 (TWO) TIMES DAILY BEFORE A MEAL. FOR DIABETES. 180 tablet 3  ? Insulin Glargine (BASAGLAR KWIKPEN) 100 UNIT/ML Inject 20 Units into the skin at bedtime. For diabetes. 30 mL 1  ? Insulin Pen Needle (BD PEN NEEDLE NANO U/F) 32G X 4 MM MISC Use with insulin as prescribed ? ?Dx Code: E11.9 100 each 2  ? ipratropium-albuterol (DUONEB) 0.5-2.5 (3) MG/3ML SOLN Take 3 mLs by nebulization every 4 (four) hours while awake for 3 days, THEN 3 mLs every 4 (four) hours as needed (shortness of breath or wheezing). 360 mL 0  ? Lancets MISC USE UP TO 3 TIMES DAILY AS DIRECTED 100 each 2  ? losartan (COZAAR) 50 MG tablet Take 1 tablet (50 mg total) by mouth daily. For blood pressure. 90 tablet 3  ? Melatonin 10 MG CAPS Take 10 mg by mouth at bedtime.    ? metFORMIN (GLUCOPHAGE) 1000  MG tablet TAKE 1 TABLET (1,000 MG TOTAL) BY MOUTH 2 (TWO) TIMES DAILY WITH A MEAL. FOR DIABETES 180 tablet 1  ? Multiple Vitamin (MULTI-VITAMINS) TABS Take 1 tablet by mouth in the morning.    ? Omega-3 Fatty Acids (SALMON OIL PO) Take 1 capsule by mouth daily.    ? ONETOUCH ULTRA test strip USE UP TO 4 TIMES DAILY AS DIRECTED 100 strip 5  ? oxyCODONE-acetaminophen (PERCOCET) 5-325 MG tablet Take 1-2 tablets by mouth every 4 (four) hours as needed for severe pain. 20 tablet 0  ? oxymetazoline (AFRIN) 0.05 % nasal spray Place 1 spray into both nostrils daily.    ? pioglitazone (ACTOS) 45 MG tablet TAKE 1 TABLET (45 MG TOTAL) BY MOUTH DAILY. FOR DIABETES. 90 tablet 1  ? pravastatin (PRAVACHOL) 40 MG tablet TAKE 1 TABLET BY MOUTH EVERY DAY IN THE EVENING  FOR CHOLESTEROL 90 tablet 1  ? sulfamethoxazole-trimethoprim (BACTRIM DS) 800-160 MG tablet Take 1 tablet by mouth 2 (two) times daily. 6 tablet 0  ? testosterone cypionate (DEPOTESTOSTERONE CYPIONATE) 200 MG/ML injection Inject 200 mg into the muscle every 14 (fourteen) days.    ? ?No current facility-administered medications for this visit.  ? ? ?Allergies:   Bupropion and Hydrochlorothiazide  ? ?Social History:  The patient  reports that he has quit smoking. He has never used smokeless tobacco. He reports that he does not currently use alcohol. He reports that he does not currently use drugs.  ? ?Family History:   family history includes Arthritis in his father, paternal grandfather, paternal grandmother, and sister; Asthma in his father, sister, sister, and son; Birth defects in his maternal grandfather; COPD in his father and sister; Cancer in his father, mother, sister, and sister; Diabetes in his paternal grandmother, sister, and sister; Heart attack in his father and mother; Hypertension in his mother and sister.  ? ? ?Review of Systems: ?Review of Systems  ?Constitutional: Negative.   ?HENT: Negative.    ?Respiratory:  Positive for shortness of breath.    ?Cardiovascular: Negative.   ?Gastrointestinal: Negative.   ?Musculoskeletal: Negative.   ?Neurological: Negative.   ?Psychiatric/Behavioral: Negative.    ?All other systems reviewed and are negative. ? ? ?PHYSIC

## 2022-04-11 ENCOUNTER — Inpatient Hospital Stay: Payer: Medicare Other

## 2022-04-11 ENCOUNTER — Other Ambulatory Visit: Payer: Self-pay

## 2022-04-11 VITALS — BP 137/84 | HR 84 | Temp 98.6°F | Resp 16 | Wt 289.0 lb

## 2022-04-11 DIAGNOSIS — C3411 Malignant neoplasm of upper lobe, right bronchus or lung: Secondary | ICD-10-CM

## 2022-04-11 DIAGNOSIS — C787 Secondary malignant neoplasm of liver and intrahepatic bile duct: Secondary | ICD-10-CM | POA: Diagnosis not present

## 2022-04-11 DIAGNOSIS — Z5111 Encounter for antineoplastic chemotherapy: Secondary | ICD-10-CM | POA: Diagnosis not present

## 2022-04-11 DIAGNOSIS — Z95828 Presence of other vascular implants and grafts: Secondary | ICD-10-CM

## 2022-04-11 LAB — CMP (CANCER CENTER ONLY)
ALT: 17 U/L (ref 0–44)
AST: 17 U/L (ref 15–41)
Albumin: 4 g/dL (ref 3.5–5.0)
Alkaline Phosphatase: 56 U/L (ref 38–126)
Anion gap: 7 (ref 5–15)
BUN: 12 mg/dL (ref 8–23)
CO2: 29 mmol/L (ref 22–32)
Calcium: 8.9 mg/dL (ref 8.9–10.3)
Chloride: 104 mmol/L (ref 98–111)
Creatinine: 1.23 mg/dL (ref 0.61–1.24)
GFR, Estimated: >60 mL/min (ref >60)
Glucose, Bld: 168 mg/dL — ABNORMAL HIGH (ref 70–99)
Potassium: 4 mmol/L (ref 3.5–5.1)
Sodium: 140 mmol/L (ref 135–145)
Total Bilirubin: 0.4 mg/dL (ref 0.3–1.2)
Total Protein: 6.2 g/dL — ABNORMAL LOW (ref 6.5–8.1)

## 2022-04-11 LAB — CBC WITH DIFFERENTIAL (CANCER CENTER ONLY)
Abs Immature Granulocytes: 0.02 10*3/uL (ref 0.00–0.07)
Basophils Absolute: 0 10*3/uL (ref 0.0–0.1)
Basophils Relative: 1 %
Eosinophils Absolute: 0.1 10*3/uL (ref 0.0–0.5)
Eosinophils Relative: 1 %
HCT: 37 % — ABNORMAL LOW (ref 39.0–52.0)
Hemoglobin: 11.8 g/dL — ABNORMAL LOW (ref 13.0–17.0)
Immature Granulocytes: 0 %
Lymphocytes Relative: 10 %
Lymphs Abs: 0.5 10*3/uL — ABNORMAL LOW (ref 0.7–4.0)
MCH: 29.6 pg (ref 26.0–34.0)
MCHC: 31.9 g/dL (ref 30.0–36.0)
MCV: 92.7 fL (ref 80.0–100.0)
Monocytes Absolute: 0.5 10*3/uL (ref 0.1–1.0)
Monocytes Relative: 9 %
Neutro Abs: 3.9 10*3/uL (ref 1.7–7.7)
Neutrophils Relative %: 79 %
Platelet Count: 201 10*3/uL (ref 150–400)
RBC: 3.99 MIL/uL — ABNORMAL LOW (ref 4.22–5.81)
RDW: 17.1 % — ABNORMAL HIGH (ref 11.5–15.5)
WBC Count: 5.1 10*3/uL (ref 4.0–10.5)
nRBC: 0 % (ref 0.0–0.2)

## 2022-04-11 MED ORDER — HEPARIN SOD (PORK) LOCK FLUSH 100 UNIT/ML IV SOLN
500.0000 [IU] | Freq: Once | INTRAVENOUS | Status: AC | PRN
  Administered 2022-04-11: 500 [IU]

## 2022-04-11 MED ORDER — ATROPINE SULFATE 1 MG/ML IV SOLN
0.5000 mg | Freq: Once | INTRAVENOUS | Status: AC | PRN
  Administered 2022-04-11: 0.5 mg via INTRAVENOUS
  Filled 2022-04-11: qty 1

## 2022-04-11 MED ORDER — SODIUM CHLORIDE 0.9% FLUSH
10.0000 mL | INTRAVENOUS | Status: DC | PRN
  Administered 2022-04-11: 10 mL

## 2022-04-11 MED ORDER — SODIUM CHLORIDE 0.9% FLUSH
10.0000 mL | Freq: Once | INTRAVENOUS | Status: AC
  Administered 2022-04-11: 10 mL

## 2022-04-11 MED ORDER — SODIUM CHLORIDE 0.9 % IV SOLN
10.0000 mg | Freq: Once | INTRAVENOUS | Status: AC
  Administered 2022-04-11: 10 mg via INTRAVENOUS
  Filled 2022-04-11: qty 10

## 2022-04-11 MED ORDER — SODIUM CHLORIDE 0.9 % IV SOLN
65.0000 mg/m2 | Freq: Once | INTRAVENOUS | Status: AC
  Administered 2022-04-11: 160 mg via INTRAVENOUS
  Filled 2022-04-11: qty 8

## 2022-04-11 MED ORDER — SODIUM CHLORIDE 0.9 % IV SOLN: Freq: Once | INTRAVENOUS | Status: AC

## 2022-04-11 MED ORDER — PALONOSETRON HCL INJECTION 0.25 MG/5ML
0.2500 mg | Freq: Once | INTRAVENOUS | Status: AC
  Administered 2022-04-11: 0.25 mg via INTRAVENOUS
  Filled 2022-04-11: qty 5

## 2022-04-11 MED ORDER — HEPARIN SOD (PORK) LOCK FLUSH 100 UNIT/ML IV SOLN: 500.0000 [IU] | Freq: Once | INTRAVENOUS | Status: DC

## 2022-04-11 NOTE — Patient Instructions (Signed)
[facility]  Discharge Instructions: ?Thank you for choosing [facility] to provide your oncology and hematology care.  ? ?If you have a lab appointment with the [facility], please go directly to the [facility] and check in at the registration area. ?  ?Wear comfortable clothing and clothing appropriate for easy access to any Portacath or PICC line.  ? ?We strive to give you quality time with your provider. You may need to reschedule your appointment if you arrive late (15 or more minutes).  Arriving late affects you and other patients whose appointments are after yours.  Also, if you miss three or more appointments without notifying the office, you may be dismissed from the clinic at the provider?s discretion.    ?  ?For prescription refill requests, have your pharmacy contact our office and allow 72 hours for refills to be completed.   ? ?Today you received the following chemotherapy and/or immunotherapy agent: Irinotecan    ?  ?To help prevent nausea and vomiting after your treatment, we encourage you to take your nausea medication as directed. ? ?BELOW ARE SYMPTOMS THAT SHOULD BE REPORTED IMMEDIATELY: ?*FEVER GREATER THAN 100.4 F (38 ?C) OR HIGHER ?*CHILLS OR SWEATING ?*NAUSEA AND VOMITING THAT IS NOT CONTROLLED WITH YOUR NAUSEA MEDICATION ?*UNUSUAL SHORTNESS OF BREATH ?*UNUSUAL BRUISING OR BLEEDING ?*URINARY PROBLEMS (pain or burning when urinating, or frequent urination) ?*BOWEL PROBLEMS (unusual diarrhea, constipation, pain near the anus) ?TENDERNESS IN MOUTH AND THROAT WITH OR WITHOUT PRESENCE OF ULCERS (sore throat, sores in mouth, or a toothache) ?UNUSUAL RASH, SWELLING OR PAIN  ?UNUSUAL VAGINAL DISCHARGE OR ITCHING  ? ?Items with * indicate a potential emergency and should be followed up as soon as possible or go to the Emergency Department if any problems should occur. ? ?Please show the CHEMOTHERAPY ALERT CARD or IMMUNOTHERAPY ALERT CARD at check-in to  the Emergency Department and triage nurse. ? ?Should you have questions after your visit or need to cancel or reschedule your appointment, please contact [facility]  Dept: [phone]  and follow the prompts.  Office hours are 8:00 a.m. to 4:30 p.m. Monday - Friday. Please note that voicemails left after 4:00 p.m. may not be returned until the following business day.  We are closed weekends and major holidays. You have access to a nurse at all times for urgent questions. Please call the main number to the clinic Dept: [phone] and follow the prompts. ? ? ?For any non-urgent questions, you may also contact your provider using MyChart. We now offer e-Visits for anyone 22 and older to request care online for non-urgent symptoms. For details visit [email or web address]. ?  ?Also download the MyChart app! Go to the app store, search "MyChart", open the app, select [facility], and log in with your MyChart username and password. ? ?Due to Covid, a mask is required upon entering the hospital/clinic. If you do not have a mask, one will be given to you upon arrival. For doctor visits, patients may have 1 support person aged 72 or older with them. For treatment visits, patients cannot have anyone with them due to current Covid guidelines and our immunocompromised population.  ? ?

## 2022-04-12 DIAGNOSIS — Z20822 Contact with and (suspected) exposure to covid-19: Secondary | ICD-10-CM | POA: Diagnosis not present

## 2022-04-17 ENCOUNTER — Ambulatory Visit (HOSPITAL_COMMUNITY)
Admission: RE | Admit: 2022-04-17 | Discharge: 2022-04-17 | Disposition: A | Discharge status: Home / Self Care | Payer: Medicare Other | Source: Ambulatory Visit | Attending: Physician Assistant | Admitting: Physician Assistant

## 2022-04-17 DIAGNOSIS — K5730 Diverticulosis of large intestine without perforation or abscess without bleeding: Secondary | ICD-10-CM | POA: Diagnosis not present

## 2022-04-17 DIAGNOSIS — C3411 Malignant neoplasm of upper lobe, right bronchus or lung: Secondary | ICD-10-CM | POA: Diagnosis not present

## 2022-04-17 DIAGNOSIS — R911 Solitary pulmonary nodule: Secondary | ICD-10-CM | POA: Diagnosis not present

## 2022-04-17 DIAGNOSIS — K7689 Other specified diseases of liver: Secondary | ICD-10-CM | POA: Diagnosis not present

## 2022-04-17 DIAGNOSIS — I700 Atherosclerosis of aorta: Secondary | ICD-10-CM | POA: Diagnosis not present

## 2022-04-17 DIAGNOSIS — J90 Pleural effusion, not elsewhere classified: Secondary | ICD-10-CM | POA: Diagnosis not present

## 2022-04-17 MED ORDER — IOHEXOL 300 MG/ML  SOLN
100.0000 mL | Freq: Once | INTRAMUSCULAR | Status: AC | PRN
  Administered 2022-04-17: 100 mL via INTRAVENOUS

## 2022-04-17 MED ORDER — HEPARIN SOD (PORK) LOCK FLUSH 100 UNIT/ML IV SOLN
INTRAVENOUS | Status: AC
Start: 2022-04-17 — End: 2022-04-17
  Administered 2022-04-17: 500 [IU] via INTRAVENOUS
  Filled 2022-04-17: qty 5

## 2022-04-17 MED ORDER — HEPARIN SOD (PORK) LOCK FLUSH 100 UNIT/ML IV SOLN
500.0000 [IU] | Freq: Once | INTRAVENOUS | Status: AC
Start: 2022-04-17 — End: 2022-04-17

## 2022-04-20 DIAGNOSIS — Z20822 Contact with and (suspected) exposure to covid-19: Secondary | ICD-10-CM | POA: Diagnosis not present

## 2022-04-21 ENCOUNTER — Telehealth: Payer: Self-pay | Admitting: Internal Medicine

## 2022-04-21 DIAGNOSIS — Z20822 Contact with and (suspected) exposure to covid-19: Secondary | ICD-10-CM | POA: Diagnosis not present

## 2022-04-21 NOTE — Telephone Encounter (Signed)
Called patient regarding upcoming appointment, patient is notified. °

## 2022-04-22 ENCOUNTER — Inpatient Hospital Stay: Payer: Medicare Other

## 2022-04-22 ENCOUNTER — Inpatient Hospital Stay: Payer: Medicare Other | Attending: Internal Medicine | Admitting: Internal Medicine

## 2022-04-22 ENCOUNTER — Telehealth: Payer: Self-pay | Admitting: Medical Oncology

## 2022-04-22 ENCOUNTER — Encounter: Payer: Self-pay | Admitting: Internal Medicine

## 2022-04-22 ENCOUNTER — Other Ambulatory Visit: Payer: Medicare Other

## 2022-04-22 ENCOUNTER — Other Ambulatory Visit: Payer: Self-pay

## 2022-04-22 VITALS — BP 134/83 | HR 75 | Temp 97.6°F | Resp 16 | Wt 292.2 lb

## 2022-04-22 DIAGNOSIS — I10 Essential (primary) hypertension: Secondary | ICD-10-CM | POA: Insufficient documentation

## 2022-04-22 DIAGNOSIS — G4733 Obstructive sleep apnea (adult) (pediatric): Secondary | ICD-10-CM | POA: Diagnosis not present

## 2022-04-22 DIAGNOSIS — C3411 Malignant neoplasm of upper lobe, right bronchus or lung: Secondary | ICD-10-CM | POA: Diagnosis not present

## 2022-04-22 DIAGNOSIS — R5383 Other fatigue: Secondary | ICD-10-CM | POA: Insufficient documentation

## 2022-04-22 DIAGNOSIS — J90 Pleural effusion, not elsewhere classified: Secondary | ICD-10-CM | POA: Insufficient documentation

## 2022-04-22 DIAGNOSIS — I2510 Atherosclerotic heart disease of native coronary artery without angina pectoris: Secondary | ICD-10-CM | POA: Insufficient documentation

## 2022-04-22 DIAGNOSIS — Z9221 Personal history of antineoplastic chemotherapy: Secondary | ICD-10-CM | POA: Diagnosis not present

## 2022-04-22 DIAGNOSIS — R590 Localized enlarged lymph nodes: Secondary | ICD-10-CM | POA: Diagnosis not present

## 2022-04-22 DIAGNOSIS — Z5111 Encounter for antineoplastic chemotherapy: Secondary | ICD-10-CM

## 2022-04-22 DIAGNOSIS — K219 Gastro-esophageal reflux disease without esophagitis: Secondary | ICD-10-CM | POA: Insufficient documentation

## 2022-04-22 DIAGNOSIS — K769 Liver disease, unspecified: Secondary | ICD-10-CM | POA: Insufficient documentation

## 2022-04-22 DIAGNOSIS — Z923 Personal history of irradiation: Secondary | ICD-10-CM | POA: Diagnosis not present

## 2022-04-22 DIAGNOSIS — I700 Atherosclerosis of aorta: Secondary | ICD-10-CM | POA: Diagnosis not present

## 2022-04-22 DIAGNOSIS — R0609 Other forms of dyspnea: Secondary | ICD-10-CM | POA: Insufficient documentation

## 2022-04-22 DIAGNOSIS — Z888 Allergy status to other drugs, medicaments and biological substances status: Secondary | ICD-10-CM | POA: Insufficient documentation

## 2022-04-22 DIAGNOSIS — E119 Type 2 diabetes mellitus without complications: Secondary | ICD-10-CM | POA: Diagnosis not present

## 2022-04-22 DIAGNOSIS — Z79899 Other long term (current) drug therapy: Secondary | ICD-10-CM | POA: Insufficient documentation

## 2022-04-22 DIAGNOSIS — Z7901 Long term (current) use of anticoagulants: Secondary | ICD-10-CM | POA: Insufficient documentation

## 2022-04-22 DIAGNOSIS — Z95828 Presence of other vascular implants and grafts: Secondary | ICD-10-CM

## 2022-04-22 LAB — CBC WITH DIFFERENTIAL (CANCER CENTER ONLY)
Abs Immature Granulocytes: 0.01 10*3/uL (ref 0.00–0.07)
Basophils Absolute: 0 10*3/uL (ref 0.0–0.1)
Basophils Relative: 1 %
Eosinophils Absolute: 0.1 10*3/uL (ref 0.0–0.5)
Eosinophils Relative: 3 %
HCT: 35.5 % — ABNORMAL LOW (ref 39.0–52.0)
Hemoglobin: 11.7 g/dL — ABNORMAL LOW (ref 13.0–17.0)
Immature Granulocytes: 0 %
Lymphocytes Relative: 12 %
Lymphs Abs: 0.5 10*3/uL — ABNORMAL LOW (ref 0.7–4.0)
MCH: 30.1 pg (ref 26.0–34.0)
MCHC: 33 g/dL (ref 30.0–36.0)
MCV: 91.3 fL (ref 80.0–100.0)
Monocytes Absolute: 0.5 10*3/uL (ref 0.1–1.0)
Monocytes Relative: 14 %
Neutro Abs: 2.7 10*3/uL (ref 1.7–7.7)
Neutrophils Relative %: 70 %
Platelet Count: 161 10*3/uL (ref 150–400)
RBC: 3.89 MIL/uL — ABNORMAL LOW (ref 4.22–5.81)
RDW: 16.4 % — ABNORMAL HIGH (ref 11.5–15.5)
WBC Count: 3.8 10*3/uL — ABNORMAL LOW (ref 4.0–10.5)
nRBC: 0 % (ref 0.0–0.2)

## 2022-04-22 LAB — CMP (CANCER CENTER ONLY)
ALT: 21 U/L (ref 0–44)
AST: 21 U/L (ref 15–41)
Albumin: 3.8 g/dL (ref 3.5–5.0)
Alkaline Phosphatase: 52 U/L (ref 38–126)
Anion gap: 9 (ref 5–15)
BUN: 18 mg/dL (ref 8–23)
CO2: 26 mmol/L (ref 22–32)
Calcium: 8.6 mg/dL — ABNORMAL LOW (ref 8.9–10.3)
Chloride: 105 mmol/L (ref 98–111)
Creatinine: 1.15 mg/dL (ref 0.61–1.24)
GFR, Estimated: >60 mL/min (ref >60)
Glucose, Bld: 172 mg/dL — ABNORMAL HIGH (ref 70–99)
Potassium: 4.3 mmol/L (ref 3.5–5.1)
Sodium: 140 mmol/L (ref 135–145)
Total Bilirubin: 0.6 mg/dL (ref 0.3–1.2)
Total Protein: 6.2 g/dL — ABNORMAL LOW (ref 6.5–8.1)

## 2022-04-22 MED ORDER — SODIUM CHLORIDE 0.9% FLUSH
10.0000 mL | Freq: Once | INTRAVENOUS | Status: AC
  Administered 2022-04-22: 10 mL

## 2022-04-22 MED ORDER — HEPARIN SOD (PORK) LOCK FLUSH 100 UNIT/ML IV SOLN
500.0000 [IU] | Freq: Once | INTRAVENOUS | Status: AC
  Administered 2022-04-22: 500 [IU]

## 2022-04-22 NOTE — Progress Notes (Signed)
No treatment today. Patient PAC deaccessed. Ambulatory to lobby.   ?

## 2022-04-22 NOTE — Telephone Encounter (Signed)
Ambulatory referral faxed via Epic to Dr Lennart Pall , MD [facility]. ?

## 2022-04-22 NOTE — Telephone Encounter (Signed)
LVM for appt with Dr. Mariel Kansky for clinical trials. ?

## 2022-04-22 NOTE — Progress Notes (Signed)
?    [name] ?[phone]   [phone] ? ?OFFICE PROGRESS NOTE ? ?Adam Koch, NP ?[place] [address] ? ?DIAGNOSIS: Relapsed extensive stage (T4, N3, M1c)  small cell lung cancer diagnosed in April 2021 and presented with large right upper lobe/right hilar mass with ipsilateral and contralateral mediastinal and right supraclavicular lymphadenopathy in addition to multiple liver lesions. He has disease progression after the first line of chemotherapy in December 2021. ? ?PRIOR THERAPY: ?1) Palliative radiotherapy to the right upper lobe lung mass under the care of Dr. Lisbeth Wise. ?2) Systemic chemotherapy with carboplatin for AUC of 5 on day 1, etoposide 100 mg/M2 on days 1, 2 and 3 in addition to Imfinzi 1500 mg IV every 3 weeks with chemotherapy then every 4 weeks for maintenance if the patient has no evidence for progression.  He will also receive Cosela 240 mg/m2 on the days of the chemotherapy.  Status post 9 cycles.  Starting from cycle #5 the patient will be on maintenance treatment with immunotherapy with Imfinzi 1500 mg IV every 4 weeks. Last dose of chemotherapy was given on November 13, 2020. This treatment was discontinued secondary to disease progression ?3) Systemic chemotherapy with carboplatin for AUC of 5 on day 1, etoposide 100 mg/M2 on days 1, 2 and 3, Tecentriq 1200 mg IV every 3 weeks as well as Cosela 250 mg/M2 on the days of the chemotherapy every 3 weeks.  First dose December 18, 2020.  Status post 8 cycles. ?4) Zepzelca (lurbinectedin) 3.2 mgm2 IV every 3 weeks. Last dose on 01/23/22. Status post 12 cycles.  ?5) Palliative systemic chemotherapy with irinotecan 65 mg/m2 on days 1 and 8 IV every 3 weeks.  Status post 3 cycles. ?  ?CURRENT THERAPY: The patient will be referred to [facility] for consideration of clinical trial with the [place]. ? ?INTERVAL HISTORY: ?Adam Wise 71 y.o. male returns to the clinic today  for follow-up visit accompanied by his wife.  The patient continues to do well with no concerning complaints except for mild fatigue and the shortness of breath with exertion.  He is scheduled to see Adam Wise soon for management of the recurrent right pleural effusion.  He may have chest tube drainage in the hospital.  He denied having any current nausea, vomiting, diarrhea or constipation.  He has no headache or visual changes.  He denied having any recent weight loss or night sweats.  He has tolerated his treatment with irinotecan fairly well.  He is here today for evaluation with repeat CT scan of the chest, abdomen and pelvis for restaging of his disease. ? ? ?MEDICAL HISTORY: ?Past Medical History:  ?Diagnosis Date  ? Chickenpox   ? Chronic knee pain   ? Chronic low back pain   ? Coronary artery calcification seen on CAT scan 11/2021  ? Coronary CTA 11/27/2021: Moderate to large right pleural effusion and compressive atelectasis right lung base. => Coronary Calcium Score 657.  Diffuse RCA calcification.  Minimal mild disease in the LAD and diagonal branches. == Overall limited study.  Notable artifact.  ? Essential hypertension   ? GERD (gastroesophageal reflux disease)   ? Hyperlipidemia   ? OSA (obstructive sleep apnea)   ? With nighttime oxygen supplementation  ? 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 large right upper lobe/right hilar mass with ipsilateral and contralateral mediastinal and right supraclavicular lymphadenopathy in addition to  multiple liver lesios. He has disease progression after the first line of chemotherapy in December 2021.  ? Testosterone deficiency   ? Type 2 diabetes mellitus ([place])   ? ? ?ALLERGIES:  is allergic to bupropion and hydrochlorothiazide. ? ?MEDICATIONS:  ?Current Outpatient Medications  ?Medication Sig Dispense Refill  ? albuterol (VENTOLIN HFA) 108 (90 Base) MCG/ACT inhaler Inhale 2 puffs into the lungs every 6 (six) hours as needed for wheezing or  shortness of breath. 18 g 0  ? amLODipine (NORVASC) 10 MG tablet TAKE 1 TABLET BY MOUTH EVERY DAY FOR BLOOD PRESSURE 90 tablet 3  ? B-D 3CC LUER-LOK SYR 22GX1" 22G X 1" 3 ML MISC USE AS INSTRUCTED FOR TESTOSTERONE INJECTION EVERY 2 WEEKS 10 each 2  ? bisoprolol (ZEBETA) 5 MG tablet Take 1 tablet (5 mg total) by mouth daily. 30 tablet 6  ? Black Pepper-Turmeric (TURMERIC COMPLEX/BLACK PEPPER PO) Take 1 tablet by mouth in the morning and at bedtime.    ? blood glucose meter kit and supplies KIT Dispense based on patient and insurance preference. Use up to four times daily as directed. 1 each 0  ? chlorpheniramine-HYDROcodone (TUSSIONEX) 10-8 MG/5ML SUER Take 5 mLs by mouth at bedtime as needed for cough. 473 mL 0  ? ELIQUIS 5 MG TABS tablet TAKE 1 TABLET BY MOUTH TWICE A DAY 180 tablet 2  ? esomeprazole (NEXIUM) 20 MG capsule Take 20 mg by mouth in the morning.    ? gabapentin (NEURONTIN) 300 MG capsule Take 1 capsule (300 mg total) by mouth 2 (two) times daily. For back pain. 180 capsule 1  ? glipiZIDE (GLUCOTROL) 10 MG tablet TAKE 1 TABLET (10 MG TOTAL) BY MOUTH 2 (TWO) TIMES DAILY BEFORE A MEAL. FOR DIABETES. 180 tablet 3  ? Insulin Glargine (BASAGLAR KWIKPEN) 100 UNIT/ML Inject 20 Units into the skin at bedtime. For diabetes. 30 mL 1  ? Insulin Pen Needle (BD PEN NEEDLE NANO U/F) 32G X 4 MM MISC Use with insulin as prescribed ? ?Dx Code: E11.9 100 each 2  ? ipratropium-albuterol (DUONEB) 0.5-2.5 (3) MG/3ML SOLN Take 3 mLs by nebulization every 4 (four) hours while awake for 3 days, THEN 3 mLs every 4 (four) hours as needed (shortness of breath or wheezing). 360 mL 0  ? Lancets MISC USE UP TO 3 TIMES DAILY AS DIRECTED 100 each 2  ? losartan (COZAAR) 50 MG tablet Take 1 tablet (50 mg total) by mouth daily. For blood pressure. 90 tablet 3  ? Melatonin 10 MG CAPS Take 10 mg by mouth at bedtime.    ? metFORMIN (GLUCOPHAGE) 1000 MG tablet TAKE 1 TABLET (1,000 MG TOTAL) BY MOUTH 2 (TWO) TIMES DAILY WITH A MEAL. FOR  DIABETES 180 tablet 1  ? Multiple Vitamin (MULTI-VITAMINS) TABS Take 1 tablet by mouth in the morning.    ? Omega-3 Fatty Acids (SALMON OIL PO) Take 1 capsule by mouth daily.    ? ONETOUCH ULTRA test strip USE UP TO 4 TIMES DAILY AS DIRECTED 100 strip 5  ? oxyCODONE-acetaminophen (PERCOCET) 5-325 MG tablet Take 1-2 tablets by mouth every 4 (four) hours as needed for severe pain. 20 tablet 0  ? oxymetazoline (AFRIN) 0.05 % nasal spray Place 1 spray into both nostrils daily.    ? pioglitazone (ACTOS) 45 MG tablet TAKE 1 TABLET (45 MG TOTAL) BY MOUTH DAILY. FOR DIABETES. 90 tablet 1  ? pravastatin (PRAVACHOL) 40 MG tablet TAKE 1 TABLET BY MOUTH EVERY DAY IN THE EVENING FOR CHOLESTEROL  90 tablet 1  ? sulfamethoxazole-trimethoprim (BACTRIM DS) 800-160 MG tablet Take 1 tablet by mouth 2 (two) times daily. 6 tablet 0  ? testosterone cypionate (DEPOTESTOSTERONE CYPIONATE) 200 MG/ML injection Inject 200 mg into the muscle every 14 (fourteen) days.    ? ?No current facility-administered medications for this visit.  ? ? ?SURGICAL HISTORY:  ?Past Surgical History:  ?Procedure Laterality Date  ? CHEST TUBE INSERTION Right 01/20/2022  ? Procedure: CHEST TUBE INSERTION;  Surgeon: Candee Furbish, MD;  Location: [facility] ENDOSCOPY;  Service: Pulmonary;  Laterality: Right;  w/ Talc Pleurodesis, planned admit for Obs afterwards  ? COLONOSCOPY WITH PROPOFOL N/A 12/17/2018  ? Procedure: COLONOSCOPY WITH PROPOFOL;  Surgeon: Virgel Manifold, MD;  Location: [facility] ENDOSCOPY;  Service: Gastroenterology;  Laterality: N/A;  ? IR IMAGING GUIDED PORT INSERTION  04/17/2020  ? IR THORACENTESIS ASP PLEURAL SPACE W/IMG GUIDE  11/29/2021  ? IR THORACENTESIS ASP PLEURAL SPACE W/IMG GUIDE  01/03/2022  ? JOINT REPLACEMENT Bilateral   ? REPLACEMENT TOTAL KNEE BILATERAL  2015  ? TONSILLECTOMY  1960  ? TRANSTHORACIC ECHOCARDIOGRAM  05/2020  ? a) 05/2020: EF 55 to 60%.  No R WMA.  Mild LVH.  Indeterminate LVEDP.  Unable to assess RVP.  Normal aortic and mitral  valves.  Mildly elevated RAP.; b) 09/2021: EF 50-55%. No RWMA. Mild LVH. ~ LVEDP. Mild LA Dilation. NORMAL RV/RAP.  Normal MV/AoV.  ? ? ?REVIEW OF SYSTEMS:  Constitutional: positive for fatigue ?Eyes: negative

## 2022-04-23 DIAGNOSIS — Z20822 Contact with and (suspected) exposure to covid-19: Secondary | ICD-10-CM | POA: Diagnosis not present

## 2022-04-24 ENCOUNTER — Ambulatory Visit (INDEPENDENT_AMBULATORY_CARE_PROVIDER_SITE_OTHER): Payer: Medicare Other | Admitting: Pulmonary Disease

## 2022-04-24 ENCOUNTER — Encounter: Payer: Self-pay | Admitting: Pulmonary Disease

## 2022-04-24 ENCOUNTER — Telehealth: Payer: Self-pay | Admitting: Internal Medicine

## 2022-04-24 VITALS — BP 116/66 | HR 77 | Temp 97.7°F | Ht 69.0 in | Wt 296.0 lb

## 2022-04-24 DIAGNOSIS — J90 Pleural effusion, not elsewhere classified: Secondary | ICD-10-CM | POA: Diagnosis not present

## 2022-04-24 DIAGNOSIS — J449 Chronic obstructive pulmonary disease, unspecified: Secondary | ICD-10-CM

## 2022-04-24 DIAGNOSIS — C3411 Malignant neoplasm of upper lobe, right bronchus or lung: Secondary | ICD-10-CM | POA: Diagnosis not present

## 2022-04-24 NOTE — Progress Notes (Signed)
? ?Synopsis: Referred in December 2022 for recurrent pleural effusion by [name], NP ? ?Subjective:  ? ?PATIENT ID: Adam Wise GENDER: male DOB: [birth year], MRN: [id number] ? ?Chief Complaint  ?Patient presents with  ? Follow-up  ?  Follow up. Patient says things are still the same since last visit.   ? ? 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  Patient was completed palliative radiotherapy and systemic chemotherapy followed by Dr. Earlie Server.  Patient unfortunately has had a recurrent right-sided effusion.  He has had 6 thoracentesis in the past.  Patient was referred today to discuss possibility of Pleurx catheter placement or consideration for other options of recurrent pleural effusion.  He has a longtime history of tobacco use.  Currently using albuterol only.  Was placed on Anoro in the past with no significant improvement.  When you know if there is other inhaler options. ? ?OV 01/16/2022: Patient was seen today for follow-up after recent surgical consultation.  Dr. Kipp Brood felt as if risk for thoracotomy were too high and concern for BPF if operating on him.  Therefore we are here today to discuss pigtail placement for talc pleurodesis versus a indwelling pleural catheter placement.  Patient would like to proceed with talc pleurodesis.  We talked about the risk benefits of both today in the office.  He does have increasing shortness of breath.  Ultrasound completed also reveals persistent right-sided fluid accumulation today in the office.  He also had a two-view chest x-ray confirming this.  We reviewed this today in the office as well. ? ?OV 02/19/2022: Here today for follow-up after recent talc pleurodesis.  Patient did well with the pigtail catheter placement  drainage and talc pleurodesis.  He was discharged home.  From respiratory standpoint doing okay.  He did have a recent CT scan of the chest on 02/06/2022 which revealed small amount of reaccumulation of fluid in the right chest.  However he does feel a lot better does not have the shortness of breath that he had in the past. ? ?OV 04/24/2022: Seen today for follow-up of pleural effusion.  He did really well after his first talc pleurodesis.  Showed significant improvement in his pleural fluid collection on the right side.  He had a recent CT scan of the chest that does however show reaccumulation.  He would like to have this redone and we talked about the need for consider tube placement and drying out the right chest completely before undergoing a repeat talc pleurodesis.  He is on Eliquis and will need to stop this. ? ? ?Past Medical History:  ?Diagnosis Date  ? Chickenpox   ? Chronic knee pain   ? Chronic low back pain   ? Coronary artery calcification seen on CAT scan 11/2021  ? Coronary CTA 11/27/2021: Moderate to large right pleural effusion and compressive atelectasis right lung base. => Coronary Calcium Score 657.  Diffuse RCA calcification.  Minimal mild disease in the LAD and diagonal branches. == Overall limited study.  Notable artifact.  ? Essential hypertension   ? GERD (gastroesophageal reflux disease)   ? Hyperlipidemia   ? OSA (obstructive sleep apnea)   ? With nighttime oxygen supplementation  ? 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 large right upper lobe/right  hilar mass with ipsilateral and contralateral mediastinal and right supraclavicular lymphadenopathy in addition to multiple liver lesios. He has disease progression after the first line of chemotherapy in December 2021.  ? Testosterone deficiency   ? Type 2 diabetes mellitus ([place])   ?  ? ?Family History  ?Problem Relation Age of Onset  ? Heart attack Mother   ? Cancer Mother   ? Hypertension Mother   ? Arthritis Father   ?  Asthma Father   ? Cancer Father   ? COPD Father   ? Heart attack Father   ? Hypertension Sister   ? Cancer Sister   ? Diabetes Sister   ? Asthma Sister   ? Cancer Sister   ? COPD Sister   ? Arthritis Sister   ? Asthma Sister   ? Diabetes Sister   ? Birth defects Maternal Grandfather   ? Arthritis Paternal Grandmother   ? Diabetes Paternal Grandmother   ? Arthritis Paternal Grandfather   ? Asthma Son   ? Other Neg Hx   ?     pituitary disorder  ?  ? ?Past Surgical History:  ?Procedure Laterality Date  ? CHEST TUBE INSERTION Right 01/20/2022  ? Procedure: CHEST TUBE INSERTION;  Surgeon: Candee Furbish, MD;  Location: [facility] ENDOSCOPY;  Service: Pulmonary;  Laterality: Right;  w/ Talc Pleurodesis, planned admit for Obs afterwards  ? COLONOSCOPY WITH PROPOFOL N/A 12/17/2018  ? Procedure: COLONOSCOPY WITH PROPOFOL;  Surgeon: Virgel Manifold, MD;  Location: [facility] ENDOSCOPY;  Service: Gastroenterology;  Laterality: N/A;  ? IR IMAGING GUIDED PORT INSERTION  04/17/2020  ? IR THORACENTESIS ASP PLEURAL SPACE W/IMG GUIDE  11/29/2021  ? IR THORACENTESIS ASP PLEURAL SPACE W/IMG GUIDE  01/03/2022  ? JOINT REPLACEMENT Bilateral   ? REPLACEMENT TOTAL KNEE BILATERAL  2015  ? TONSILLECTOMY  1960  ? TRANSTHORACIC ECHOCARDIOGRAM  05/2020  ? a) 05/2020: EF 55 to 60%.  No R WMA.  Mild LVH.  Indeterminate LVEDP.  Unable to assess RVP.  Normal aortic and mitral valves.  Mildly elevated RAP.; b) 09/2021: EF 50-55%. No RWMA. Mild LVH. ~ LVEDP. Mild LA Dilation. NORMAL RV/RAP.  Normal MV/AoV.  ? ? ?Social History  ? ?Socioeconomic History  ? Marital status: Married  ?  Spouse name: Not on file  ? Number of children: Not on file  ? Years of education: Not on file  ? Highest education level: Not on file  ?Occupational History  ? Not on file  ?Tobacco Use  ? Smoking status: Former  ? Smokeless tobacco: Never  ?Vaping Use  ? Vaping Use: Never used  ?Substance and Sexual Activity  ? Alcohol use: Not Currently  ?  Comment: rarely  ? Drug use: Not  Currently  ? Sexual activity: Yes  ?Other Topics Concern  ? Not on file  ?Social History Narrative  ? Married.  ? Moved from [place].  ? Retired.  ? ?Social Determinants of Health  ? ?Financial Resource Strain: Low Risk   ? Difficulty of Paying Living Expenses: Not hard at all  ?Food Insecurity: No Food Insecurity  ? Worried About Charity fundraiser in the Last Year: Never true  ? Ran Out of Food in the Last Year: Never true  ?Transportation Needs: No Transportation Needs  ? Lack of Transportation (Medical): No  ? Lack of Transportation (Non-Medical): No  ?Physical Activity: Inactive  ? Days of Exercise per Week: 0 days  ? Minutes of Exercise per Session: 0 min  ?  Stress: No Stress Concern Present  ? Feeling of Stress : Only a little  ?Social Connections: Moderately Integrated  ? Frequency of Communication with Friends and Family: Twice a week  ? Frequency of Social Gatherings with Friends and Family: Twice a week  ? Attends Religious Services: 1 to 4 times per year  ? Active Member of Clubs or Organizations: No  ? Attends Archivist Meetings: Never  ? Marital Status: Married  ?Intimate Partner Violence: Not At Risk  ? Fear of Current or Ex-Partner: No  ? Emotionally Abused: No  ? Physically Abused: No  ? Sexually Abused: No  ?  ? ?Allergies  ?Allergen Reactions  ? Bupropion   ?  Racing heart  ? Hydrochlorothiazide Other (See Comments)  ?  Cramping to lower extremities  ?  ? ?Outpatient Medications Prior to Visit  ?Medication Sig Dispense Refill  ? albuterol (VENTOLIN HFA) 108 (90 Base) MCG/ACT inhaler Inhale 2 puffs into the lungs every 6 (six) hours as needed for wheezing or shortness of breath. 18 g 0  ? amLODipine (NORVASC) 10 MG tablet TAKE 1 TABLET BY MOUTH EVERY DAY FOR BLOOD PRESSURE 90 tablet 3  ? B-D 3CC LUER-LOK SYR 22GX1" 22G X 1" 3 ML MISC USE AS INSTRUCTED FOR TESTOSTERONE INJECTION EVERY 2 WEEKS 10 each 2  ? bisoprolol (ZEBETA) 5 MG tablet Take 1 tablet (5 mg total) by mouth daily. 30 tablet 6  ?  Black Pepper-Turmeric (TURMERIC COMPLEX/BLACK PEPPER PO) Take 1 tablet by mouth in the morning and at bedtime.    ? blood glucose meter kit and supplies KIT Dispense based on patient and insurance preference. [place]

## 2022-04-24 NOTE — Patient Instructions (Addendum)
Thank you for visiting Dr. Valeta Harms at [facility] Pul[facility]. ?Today we recommend the following: ? ?Orders Placed This Encounter  ?Procedures  ? Procedural/ Surgical Case Request: INSERTION PLEURAL DRAINAGE CATHETER - Pigtail tail, drainage  ? Ambulatory referral to Pulmonology  ? ?Procedure on 04/28/2022 for chest tube and talc  ? ?Return in about 18 days (around 05/12/2022) for with APP or Dr. Valeta Harms. ? ? ? ?Please do your part to reduce the spread of COVID-19.  ? ?

## 2022-04-24 NOTE — H&P (View-Only) (Signed)
? ?Synopsis: Referred in December 2022 for recurrent pleural effusion by [name], NP ? ?Subjective:  ? ?PATIENT ID: Adam Wise GENDER: male DOB: 1951-05-18, MRN: [id number] ? ?Chief Complaint  ?Patient presents with  ? Follow-up  ?  Follow up. Patient says things are still the same since last visit.   ? ? 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  Patient was completed palliative radiotherapy and systemic chemotherapy followed by Dr. Earlie Server.  Patient unfortunately has had a recurrent right-sided effusion.  He has had 6 thoracentesis in the past.  Patient was referred today to discuss possibility of Pleurx catheter placement or consideration for other options of recurrent pleural effusion.  He has a longtime history of tobacco use.  Currently using albuterol only.  Was placed on Anoro in the past with no significant improvement.  When you know if there is other inhaler options. ? ?OV 01/16/2022: Patient was seen today for follow-up after recent surgical consultation.  Dr. Kipp Brood felt as if risk for thoracotomy were too high and concern for BPF if operating on him.  Therefore we are here today to discuss pigtail placement for talc pleurodesis versus a indwelling pleural catheter placement.  Patient would like to proceed with talc pleurodesis.  We talked about the risk benefits of both today in the office.  He does have increasing shortness of breath.  Ultrasound completed also reveals persistent right-sided fluid accumulation today in the office.  He also had a two-view chest x-ray confirming this.  We reviewed this today in the office as well. ? ?OV 02/19/2022: Here today for follow-up after recent talc pleurodesis.  Patient did well with the pigtail catheter placement  drainage and talc pleurodesis.  He was discharged home.  From respiratory standpoint doing okay.  He did have a recent CT scan of the chest on 02/06/2022 which revealed small amount of reaccumulation of fluid in the right chest.  However he does feel a lot better does not have the shortness of breath that he had in the past. ? ?OV 04/24/2022: Seen today for follow-up of pleural effusion.  He did really well after his first talc pleurodesis.  Showed significant improvement in his pleural fluid collection on the right side.  He had a recent CT scan of the chest that does however show reaccumulation.  He would like to have this redone and we talked about the need for consider tube placement and drying out the right chest completely before undergoing a repeat talc pleurodesis.  He is on Eliquis and will need to stop this. ? ? ?Past Medical History:  ?Diagnosis Date  ? Chickenpox   ? Chronic knee pain   ? Chronic low back pain   ? Coronary artery calcification seen on CAT scan 11/2021  ? Coronary CTA 11/27/2021: Moderate to large right pleural effusion and compressive atelectasis right lung base. => Coronary Calcium Score 657.  Diffuse RCA calcification.  Minimal mild disease in the LAD and diagonal branches. == Overall limited study.  Notable artifact.  ? Essential hypertension   ? GERD (gastroesophageal reflux disease)   ? Hyperlipidemia   ? OSA (obstructive sleep apnea)   ? With nighttime oxygen supplementation  ? 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 large right upper lobe/right  hilar mass with ipsilateral and contralateral mediastinal and right supraclavicular lymphadenopathy in addition to multiple liver lesios. He has disease progression after the first line of chemotherapy in December 2021.  ? Testosterone deficiency   ? Type 2 diabetes mellitus ([place])   ?  ? ?Family History  ?Problem Relation Age of Onset  ? Heart attack Mother   ? Cancer Mother   ? Hypertension Mother   ? Arthritis Father   ?  Asthma Father   ? Cancer Father   ? COPD Father   ? Heart attack Father   ? Hypertension Sister   ? Cancer Sister   ? Diabetes Sister   ? Asthma Sister   ? Cancer Sister   ? COPD Sister   ? Arthritis Sister   ? Asthma Sister   ? Diabetes Sister   ? Birth defects Maternal Grandfather   ? Arthritis Paternal Grandmother   ? Diabetes Paternal Grandmother   ? Arthritis Paternal Grandfather   ? Asthma Son   ? Other Neg Hx   ?     pituitary disorder  ?  ? ?Past Surgical History:  ?Procedure Laterality Date  ? CHEST TUBE INSERTION Right 01/20/2022  ? Procedure: CHEST TUBE INSERTION;  Surgeon: Candee Furbish, MD;  Location: [facility] ENDOSCOPY;  Service: Pulmonary;  Laterality: Right;  w/ Talc Pleurodesis, planned admit for Obs afterwards  ? COLONOSCOPY WITH PROPOFOL N/A 12/17/2018  ? Procedure: COLONOSCOPY WITH PROPOFOL;  Surgeon: Virgel Manifold, MD;  Location: [facility] ENDOSCOPY;  Service: Gastroenterology;  Laterality: N/A;  ? IR IMAGING GUIDED PORT INSERTION  04/17/2020  ? IR THORACENTESIS ASP PLEURAL SPACE W/IMG GUIDE  11/29/2021  ? IR THORACENTESIS ASP PLEURAL SPACE W/IMG GUIDE  01/03/2022  ? JOINT REPLACEMENT Bilateral   ? REPLACEMENT TOTAL KNEE BILATERAL  2015  ? TONSILLECTOMY  1960  ? TRANSTHORACIC ECHOCARDIOGRAM  05/2020  ? a) 05/2020: EF 55 to 60%.  No R WMA.  Mild LVH.  Indeterminate LVEDP.  Unable to assess RVP.  Normal aortic and mitral valves.  Mildly elevated RAP.; b) 09/2021: EF 50-55%. No RWMA. Mild LVH. ~ LVEDP. Mild LA Dilation. NORMAL RV/RAP.  Normal MV/AoV.  ? ? ?Social History  ? ?Socioeconomic History  ? Marital status: Married  ?  Spouse name: Not on file  ? Number of children: Not on file  ? Years of education: Not on file  ? Highest education level: Not on file  ?Occupational History  ? Not on file  ?Tobacco Use  ? Smoking status: Former  ? Smokeless tobacco: Never  ?Vaping Use  ? Vaping Use: Never used  ?Substance and Sexual Activity  ? Alcohol use: Not Currently  ?  Comment: rarely  ? Drug use: Not  Currently  ? Sexual activity: Yes  ?Other Topics Concern  ? Not on file  ?Social History Narrative  ? Married.  ? Moved from [place].  ? Retired.  ? ?Social Determinants of Health  ? ?Financial Resource Strain: Low Risk   ? Difficulty of Paying Living Expenses: Not hard at all  ?Food Insecurity: No Food Insecurity  ? Worried About Charity fundraiser in the Last Year: Never true  ? Ran Out of Food in the Last Year: Never true  ?Transportation Needs: No Transportation Needs  ? Lack of Transportation (Medical): No  ? Lack of Transportation (Non-Medical): No  ?Physical Activity: Inactive  ? Days of Exercise per Week: 0 days  ? Minutes of Exercise per Session: 0 min  ?  Stress: No Stress Concern Present  ? Feeling of Stress : Only a little  ?Social Connections: Moderately Integrated  ? Frequency of Communication with Friends and Family: Twice a week  ? Frequency of Social Gatherings with Friends and Family: Twice a week  ? Attends Religious Services: 1 to 4 times per year  ? Active Member of Clubs or Organizations: No  ? Attends Archivist Meetings: Never  ? Marital Status: Married  ?Intimate Partner Violence: Not At Risk  ? Fear of Current or Ex-Partner: No  ? Emotionally Abused: No  ? Physically Abused: No  ? Sexually Abused: No  ?  ? ?Allergies  ?Allergen Reactions  ? Bupropion   ?  Racing heart  ? Hydrochlorothiazide Other (See Comments)  ?  Cramping to lower extremities  ?  ? ?Outpatient Medications Prior to Visit  ?Medication Sig Dispense Refill  ? albuterol (VENTOLIN HFA) 108 (90 Base) MCG/ACT inhaler Inhale 2 puffs into the lungs every 6 (six) hours as needed for wheezing or shortness of breath. 18 g 0  ? amLODipine (NORVASC) 10 MG tablet TAKE 1 TABLET BY MOUTH EVERY DAY FOR BLOOD PRESSURE 90 tablet 3  ? B-D 3CC LUER-LOK SYR 22GX1" 22G X 1" 3 ML MISC USE AS INSTRUCTED FOR TESTOSTERONE INJECTION EVERY 2 WEEKS 10 each 2  ? bisoprolol (ZEBETA) 5 MG tablet Take 1 tablet (5 mg total) by mouth daily. 30 tablet 6  ?  Black Pepper-Turmeric (TURMERIC COMPLEX/BLACK PEPPER PO) Take 1 tablet by mouth in the morning and at bedtime.    ? blood glucose meter kit and supplies KIT Dispense based on patient and insurance preference. [place]

## 2022-04-24 NOTE — Telephone Encounter (Signed)
Scheduled per los, patient has been called and notified. 

## 2022-04-28 ENCOUNTER — Encounter (HOSPITAL_COMMUNITY)
Admission: RE | Disposition: A | Discharge status: Home / Self Care | Payer: Self-pay | Source: Home / Self Care | Attending: Internal Medicine

## 2022-04-28 ENCOUNTER — Telehealth: Payer: Self-pay | Admitting: Internal Medicine

## 2022-04-28 ENCOUNTER — Ambulatory Visit (HOSPITAL_COMMUNITY): Payer: Medicare Other

## 2022-04-28 ENCOUNTER — Ambulatory Visit (HOSPITAL_COMMUNITY)
Admission: RE | Admit: 2022-04-28 | Discharge: 2022-04-28 | Disposition: A | Discharge status: Home / Self Care | Payer: Medicare Other | Attending: Internal Medicine | Admitting: Internal Medicine

## 2022-04-28 DIAGNOSIS — J90 Pleural effusion, not elsewhere classified: Secondary | ICD-10-CM | POA: Insufficient documentation

## 2022-04-28 HISTORY — PX: CHEST TUBE INSERTION: SHX231

## 2022-04-28 SURGERY — INSERTION, PLEURAL DRAINAGE CATHETER
Anesthesia: General | Laterality: Right

## 2022-04-28 MED ORDER — TALC (STERITALC) POWDER FOR INTRAPLEURAL USE
4.0000 g | Freq: Once | INTRAVENOUS | Status: AC
  Administered 2022-04-28: 4 g via INTRAPLEURAL
  Filled 2022-04-28: qty 4

## 2022-04-28 MED ORDER — HYDROMORPHONE HCL 1 MG/ML IJ SOLN
INTRAMUSCULAR | Status: AC
Start: 2022-04-28 — End: ?
  Filled 2022-04-28: qty 2

## 2022-04-28 MED ORDER — HYDROMORPHONE HCL 1 MG/ML IJ SOLN
0.5000 mg | INTRAMUSCULAR | Status: DC | PRN
  Administered 2022-04-28 (×3): 0.5 mg via INTRAVENOUS

## 2022-04-28 MED ORDER — LIDOCAINE HCL (PF) 1 % IJ SOLN
INTRAMUSCULAR | Status: AC
  Filled 2022-04-28: qty 30

## 2022-04-28 NOTE — Op Note (Signed)
Chest Tube Pleurodesis Note ? ?Adam Wise  ?[id number]  ?July 14, 1951 ? ?Date:04/28/22  ?Time:3:38 PM  ? ?Provider Performing:Christain Niznik C Tamala Julian  ? ?Procedure: Chemical Pleurodesis via Chest Tube (43888) ? ?Indication(s) ?Induced scarring of pleural space ? ?Consent ?Risks of the procedure as well as the alternatives and risks of each were explained to the patient and/or caregiver.  Consent for the procedure was obtained. ? ? ?Anesthesia ?10cc 1% Lidocaine injected in with pleurodesis agents, dilaudid PRN ? ? ?Time Out ?Verified patient identification, verified procedure, site/side was marked, verified correct patient position, special equipment/implants available, medications/allergies/relevant history reviewed, required imaging and test results available. ? ? ?Sterile Technique ?Hand hygiene, gloves ? ? ?Procedure Description ?Existing pleural catheter was cleaned and accessed in sterile manner.  Talc 4g in 50cc saline slurry administered into pleural space.  The chest tube will be clamped and patient will rotate positions for 1 hour then chest tube will be placed to suction. ? ? ?Complications/Tolerance ?None; patient tolerated the procedure well. ? ? ?EBL ?None ? ? ?Specimen(s) ?None ? ?

## 2022-04-28 NOTE — Telephone Encounter (Signed)
error 

## 2022-04-28 NOTE — Interval H&P Note (Signed)
Seen for repeat tube pleurodesis. ?No interim changes in exam. ?Patient consents. ? ?Erskine Emery MD PCCM ?

## 2022-04-28 NOTE — Op Note (Signed)
Insertion of Chest Tube Procedure Note ? ?Adam Wise  ?[id number]  ?Nov 30, 1951 ? ?Date:04/28/22  ?Time:3:37 PM  ? ? ?Provider Performing: Candee Furbish  ? ?Procedure: Pleural Catheter Insertion w/ Imaging Guidance (25003) ? ?Indication(s) ?Effusion ? ?Consent ?Risks of the procedure as well as the alternatives and risks of each were explained to the patient and/or caregiver.  Consent for the procedure was obtained and is signed in the bedside chart ? ?Anesthesia ?Topical only with 1% lidocaine  ? ? ?Time Out ?Verified patient identification, verified procedure, site/side was marked, verified correct patient position, special equipment/implants available, medications/allergies/relevant history reviewed, required imaging and test results available. ? ? ?Sterile Technique ?Maximal sterile technique including full sterile barrier drape, hand hygiene, sterile gown, sterile gloves, mask, hair covering, sterile ultrasound probe cover (if used). ? ? ?Procedure Description ?Ultrasound used to identify appropriate pleural anatomy for placement and overlying skin marked. Area of placement cleaned and draped in sterile fashion.  A 14 French pigtail pleural catheter was placed into the right pleural space using Seldinger technique. Appropriate return of fluid was obtained.  The tube was connected to atrium and placed on -20 cm H2O wall suction. ? ? ?Complications/Tolerance ?None; patient tolerated the procedure well. ?Chest X-ray is ordered to verify placement. ? ? ?EBL ?Minimal ? ?Specimen(s) ?none ? ?

## 2022-04-29 ENCOUNTER — Inpatient Hospital Stay: Payer: Medicare Other

## 2022-04-29 ENCOUNTER — Telehealth: Payer: Self-pay

## 2022-04-29 ENCOUNTER — Encounter (HOSPITAL_COMMUNITY): Payer: Self-pay | Admitting: Internal Medicine

## 2022-04-29 NOTE — Telephone Encounter (Signed)
Referral has been sent to Lennart Pall, MD at [facility] ? ?[place]: [phone] ?[place]: [phone] ? ?Confirmation was received. ? ?04/22/22 Referral ?04/22/22 Progress note ?04/22/22 Labs ?10/12/21 Cytology report ?04/09/20 Surgical path report ?Facesheet ?

## 2022-05-08 ENCOUNTER — Inpatient Hospital Stay (HOSPITAL_BASED_OUTPATIENT_CLINIC_OR_DEPARTMENT_OTHER): Payer: Medicare Other | Admitting: Internal Medicine

## 2022-05-08 VITALS — BP 145/84 | HR 84 | Temp 96.3°F | Resp 18 | Wt 295.1 lb

## 2022-05-08 DIAGNOSIS — J90 Pleural effusion, not elsewhere classified: Secondary | ICD-10-CM | POA: Diagnosis not present

## 2022-05-08 DIAGNOSIS — K219 Gastro-esophageal reflux disease without esophagitis: Secondary | ICD-10-CM | POA: Diagnosis not present

## 2022-05-08 DIAGNOSIS — C3411 Malignant neoplasm of upper lobe, right bronchus or lung: Secondary | ICD-10-CM

## 2022-05-08 DIAGNOSIS — R590 Localized enlarged lymph nodes: Secondary | ICD-10-CM | POA: Diagnosis not present

## 2022-05-08 DIAGNOSIS — K769 Liver disease, unspecified: Secondary | ICD-10-CM | POA: Diagnosis not present

## 2022-05-08 DIAGNOSIS — I10 Essential (primary) hypertension: Secondary | ICD-10-CM | POA: Diagnosis not present

## 2022-05-08 NOTE — Telephone Encounter (Signed)
I have called Dr. Mariel Kansky' referral Coordinator, Sharlet Salina. I had to leave a voicemail requesting a return call asap. I have left all the of the pts information within my message along with our direct call back number.

## 2022-05-08 NOTE — Telephone Encounter (Signed)
Osborne Casco, [facility] Referral Coordinator has called me back and request the records be faxed to [phone].   Referral Progress note Labs CT scans Cytology report Surgical path report Facesheet   Confirmation was received.

## 2022-05-08 NOTE — Progress Notes (Signed)
START OFF PATHWAY REGIMEN - Small Cell Lung   [id number]:Carboplatin AUC=5 IV D1 + Etoposide 100 mg/m2 IV D1-3 q21 Days:   A cycle is every 21 days:     Carboplatin      Etoposide   **Always confirm dose/schedule in your pharmacy ordering system**  Patient Characteristics: Relapsed or Progressive Disease, Third Line and Beyond Therapeutic Status: Relapsed or Progressive Disease Line of Therapy: Third Line and Beyond  Intent of Therapy: Non-Curative / Palliative Intent, Discussed with Patient

## 2022-05-08 NOTE — Progress Notes (Signed)
    [facility] [phone]   [phone]  OFFICE PROGRESS NOTE  Clark, Katherine K, NP [address] [place] [address]  DIAGNOSIS: Relapsed extensive stage (T4, N3, M1c)  small cell lung cancer diagnosed in April 2021 and presented with large right upper lobe/right hilar mass with ipsilateral and contralateral mediastinal and right supraclavicular lymphadenopathy in addition to multiple liver lesions. He has disease progression after the first line of chemotherapy in December 2021.  PRIOR THERAPY: 1) Palliative radiotherapy to the right upper lobe lung mass under the care of Dr. Moody. 2) Systemic chemotherapy with carboplatin for AUC of 5 on day 1, etoposide 100 mg/M2 on days 1, 2 and 3 in addition to Imfinzi 1500 mg IV every 3 weeks with chemotherapy then every 4 weeks for maintenance if the patient has no evidence for progression.  He will also receive Cosela 240 mg/m2 on the days of the chemotherapy.  Status post 9 cycles.  Starting from cycle #5 the patient will be on maintenance treatment with immunotherapy with Imfinzi 1500 mg IV every 4 weeks. Last dose of chemotherapy was given on November 13, 2020. This treatment was discontinued secondary to disease progression 3) Systemic chemotherapy with carboplatin for AUC of 5 on day 1, etoposide 100 mg/M2 on days 1, 2 and 3, Tecentriq 1200 mg IV every 3 weeks as well as Cosela 250 mg/M2 on the days of the chemotherapy every 3 weeks.  First dose December 18, 2020.  Status post 8 cycles. 4) Zepzelca (lurbinectedin) 3.2 mgm2 IV every 3 weeks. Last dose on 01/23/22. Status post 12 cycles.  5) Palliative systemic chemotherapy with irinotecan 65 mg/m2 on days 1 and 8 IV every 3 weeks.  Status post 3 cycles.  Last dose was given April 01, 2022 discontinued secondary to disease progression   CURRENT THERAPY: The patient will be referred to [facility] for consideration of clinical trial with the DLL3 BiTE  Tarlatamab.  If not candidate for the clinical trial, we will try the patient again on systemic chemotherapy with carboplatin for AUC of 5 on day 1, etoposide 100 Mg/M2 on days 1, 2 and 3 with Cosela before the chemotherapy.  First dose expected to start on 05/21/2022.  INTERVAL HISTORY: Adam Wise 71 y.o. male returns to the clinic today for follow-up visit accompanied by his wife.  The patient is feeling fine today with no concerning complaints except for the baseline fatigue and shortness of breath with exertion.  He was seen by Dr. Smith and underwent right-sided talc pleurodesis on 04/28/2022.  He denied having any current chest pain but has mild cough with no hemoptysis.  He has no nausea, vomiting, diarrhea or constipation.  He has no headache or visual changes.  He denied having any significant weight loss or night sweats.  He was referred to Dr. Weiss at [facility] for evaluation of enrollment and the DLL 3 BiTE Tarlatamab but he has not received an appointment yet.  We called him again and try to get the patient to be seen sooner for discussion of this option.  He is expected to call next week.  He is here today for evaluation and recommendation regarding his condition.   MEDICAL HISTORY: Past Medical History:  Diagnosis Date   Chickenpox    Chronic knee pain    Chronic low back pain    Coronary artery calcification seen on CAT scan 11/2021   Coronary CTA 11/27/2021: Moderate   to large right pleural effusion and compressive atelectasis right lung base. => Coronary Calcium Score 657.  Diffuse RCA calcification.  Minimal mild disease in the LAD and diagonal branches. == Overall limited study.  Notable artifact.   Essential hypertension    GERD (gastroesophageal reflux disease)    Hyperlipidemia    OSA (obstructive sleep apnea)    With nighttime oxygen supplementation   T4, M3, M1 C Metastatic Small Cell Lung Cancer 03/2020   large right upper lobe/right hilar mass with ipsilateral and  contralateral mediastinal and right supraclavicular lymphadenopathy in addition to multiple liver lesios. He has disease progression after the first line of chemotherapy in December 2021.   Testosterone deficiency    Type 2 diabetes mellitus (HCC)     ALLERGIES:  is allergic to bupropion and hydrochlorothiazide.  MEDICATIONS:  Current Outpatient Medications  Medication Sig Dispense Refill   albuterol (VENTOLIN HFA) 108 (90 Base) MCG/ACT inhaler Inhale 2 puffs into the lungs every 6 (six) hours as needed for wheezing or shortness of breath. 18 g 0   amLODipine (NORVASC) 10 MG tablet TAKE 1 TABLET BY MOUTH EVERY DAY FOR BLOOD PRESSURE 90 tablet 3   B-D 3CC LUER-LOK SYR 22GX1" 22G X 1" 3 ML MISC USE AS INSTRUCTED FOR TESTOSTERONE INJECTION EVERY 2 WEEKS 10 each 2   bisoprolol (ZEBETA) 5 MG tablet Take 1 tablet (5 mg total) by mouth daily. 30 tablet 6   Black Pepper-Turmeric (TURMERIC COMPLEX/BLACK PEPPER PO) Take 1 tablet by mouth in the morning and at bedtime.     blood glucose meter kit and supplies KIT Dispense based on patient and insurance preference. Use up to four times daily as directed. 1 each 0   chlorpheniramine-HYDROcodone (TUSSIONEX) 10-8 MG/5ML SUER Take 5 mLs by mouth at bedtime as needed for cough. 473 mL 0   ELIQUIS 5 MG TABS tablet TAKE 1 TABLET BY MOUTH TWICE A DAY 180 tablet 2   esomeprazole (NEXIUM) 20 MG capsule Take 20 mg by mouth in the morning.     gabapentin (NEURONTIN) 300 MG capsule Take 1 capsule (300 mg total) by mouth 2 (two) times daily. For back pain. 180 capsule 1   glipiZIDE (GLUCOTROL) 10 MG tablet TAKE 1 TABLET (10 MG TOTAL) BY MOUTH 2 (TWO) TIMES DAILY BEFORE A MEAL. FOR DIABETES. 180 tablet 3   Insulin Glargine (BASAGLAR KWIKPEN) 100 UNIT/ML Inject 20 Units into the skin at bedtime. For diabetes. 30 mL 1   Insulin Pen Needle (BD PEN NEEDLE NANO U/F) 32G X 4 MM MISC Use with insulin as prescribed  Dx Code: E11.9 100 each 2   ipratropium-albuterol (DUONEB)  0.5-2.5 (3) MG/3ML SOLN Take 3 mLs by nebulization every 4 (four) hours while awake for 3 days, THEN 3 mLs every 4 (four) hours as needed (shortness of breath or wheezing). 360 mL 0   Lancets MISC USE UP TO 3 TIMES DAILY AS DIRECTED 100 each 2   losartan (COZAAR) 50 MG tablet Take 1 tablet (50 mg total) by mouth daily. For blood pressure. 90 tablet 3   Melatonin 10 MG CAPS Take 10 mg by mouth at bedtime.     metFORMIN (GLUCOPHAGE) 1000 MG tablet TAKE 1 TABLET (1,000 MG TOTAL) BY MOUTH 2 (TWO) TIMES DAILY WITH A MEAL. FOR DIABETES 180 tablet 1   Multiple Vitamin (MULTI-VITAMINS) TABS Take 1 tablet by mouth in the morning.     Omega-3 Fatty Acids (SALMON OIL PO) Take 1 capsule by mouth daily.  ONETOUCH ULTRA test strip USE UP TO 4 TIMES DAILY AS DIRECTED 100 strip 5   oxyCODONE-acetaminophen (PERCOCET) 5-325 MG tablet Take 1-2 tablets by mouth every 4 (four) hours as needed for severe pain. (Patient not taking: Reported on 04/25/2022) 20 tablet 0   oxymetazoline (AFRIN) 0.05 % nasal spray Place 1 spray into both nostrils daily.     pioglitazone (ACTOS) 45 MG tablet TAKE 1 TABLET (45 MG TOTAL) BY MOUTH DAILY. FOR DIABETES. 90 tablet 1   pravastatin (PRAVACHOL) 40 MG tablet TAKE 1 TABLET BY MOUTH EVERY DAY IN THE EVENING FOR CHOLESTEROL 90 tablet 1   testosterone cypionate (DEPOTESTOSTERONE CYPIONATE) 200 MG/ML injection Inject 200 mg into the muscle every 14 (fourteen) days.     No current facility-administered medications for this visit.    SURGICAL HISTORY:  Past Surgical History:  Procedure Laterality Date   CHEST TUBE INSERTION Right 01/20/2022   Procedure: CHEST TUBE INSERTION;  Surgeon: Candee Furbish, MD;  Location: [facility] ENDOSCOPY;  Service: Pulmonary;  Laterality: Right;  w/ Talc Pleurodesis, planned admit for Obs afterwards   CHEST TUBE INSERTION Right 04/28/2022   Procedure: INSERTION PLEURAL DRAINAGE CATHETER - Pigtail tail, drainage;  Surgeon: Candee Furbish, MD;  Location: [facility]  ENDOSCOPY;  Service: Pulmonary;  Laterality: Right;  talc pleurodesis   COLONOSCOPY WITH PROPOFOL N/A 12/17/2018   Procedure: COLONOSCOPY WITH PROPOFOL;  Surgeon: Virgel Manifold, MD;  Location: [facility] ENDOSCOPY;  Service: Gastroenterology;  Laterality: N/A;   IR IMAGING GUIDED PORT INSERTION  04/17/2020   IR THORACENTESIS ASP PLEURAL SPACE W/IMG GUIDE  11/29/2021   IR THORACENTESIS ASP PLEURAL SPACE W/IMG GUIDE  01/03/2022   JOINT REPLACEMENT Bilateral    REPLACEMENT TOTAL KNEE BILATERAL  2015   TONSILLECTOMY  1960   TRANSTHORACIC ECHOCARDIOGRAM  05/2020   a) 05/2020: EF 55 to 60%.  No R WMA.  Mild LVH.  Indeterminate LVEDP.  Unable to assess RVP.  Normal aortic and mitral valves.  Mildly elevated RAP.; b) 09/2021: EF 50-55%. No RWMA. Mild LVH. ~ LVEDP. Mild LA Dilation. NORMAL RV/RAP.  Normal MV/AoV.    REVIEW OF SYSTEMS:  Constitutional: positive for fatigue Eyes: negative Ears, nose, mouth, throat, and face: negative Respiratory: positive for cough and dyspnea on exertion Cardiovascular: negative Gastrointestinal: negative Genitourinary:negative Integument/breast: negative Hematologic/lymphatic: negative Musculoskeletal:negative Neurological: negative Behavioral/Psych: negative Endocrine: negative Allergic/Immunologic: negative   PHYSICAL EXAMINATION: General appearance: alert, cooperative, fatigued, and no distress Head: Normocephalic, without obvious abnormality, atraumatic Neck: no adenopathy, no JVD, supple, symmetrical, trachea midline, and thyroid not enlarged, symmetric, no tenderness/mass/nodules Lymph nodes: Cervical, supraclavicular, and axillary nodes normal. Resp: diminished breath sounds RLL and dullness to percussion RLL Back: symmetric, no curvature. ROM normal. No CVA tenderness. Cardio: regular rate and rhythm, S1, S2 normal, no murmur, click, rub or gallop GI: soft, non-tender; bowel sounds normal; no masses,  no organomegaly Extremities: extremities normal,  atraumatic, no cyanosis or edema Neurologic: Alert and oriented X 3, normal strength and tone. Normal symmetric reflexes. Normal coordination and gait  ECOG PERFORMANCE STATUS: 1 - Symptomatic but completely ambulatory  Blood pressure (!) 145/84, pulse 84, temperature (!) 96.3 F (35.7 C), temperature source Tympanic, resp. rate 18, weight 295 lb 1 oz (133.8 kg), SpO2 93 %.  LABORATORY DATA: Lab Results  Component Value Date   WBC 3.8 (L) 04/22/2022   HGB 11.7 (L) 04/22/2022   HCT 35.5 (L) 04/22/2022   MCV 91.3 04/22/2022   PLT 161 04/22/2022      Chemistry  Component Value Date/Time   NA 140 04/22/2022 1045   K 4.3 04/22/2022 1045   CL 105 04/22/2022 1045   CO2 26 04/22/2022 1045   BUN 18 04/22/2022 1045   CREATININE 1.15 04/22/2022 1045   CREATININE 1.45 (H) 10/18/2021 1533      Component Value Date/Time   CALCIUM 8.6 (L) 04/22/2022 1045   ALKPHOS 52 04/22/2022 1045   AST 21 04/22/2022 1045   ALT 21 04/22/2022 1045   BILITOT 0.6 04/22/2022 1045       RADIOGRAPHIC STUDIES: CT Chest W Contrast  Result Date: 04/18/2022 CLINICAL DATA:  70-year-old male with history of right-sided small cell lung cancer originally diagnosed 2 years ago. Radiation therapy complete. Chemotherapy ongoing. Follow-up study. * Tracking Code: BO * EXAM: CT CHEST, ABDOMEN, AND PELVIS WITH CONTRAST TECHNIQUE: Multidetector CT imaging of the chest, abdomen and pelvis was performed following the standard protocol during bolus administration of intravenous contrast. RADIATION DOSE REDUCTION: This exam was performed according to the departmental dose-optimization program which includes automated exposure control, adjustment of the mA and/or kV according to patient size and/or use of iterative reconstruction technique. CONTRAST:  100mL OMNIPAQUE IOHEXOL 300 MG/ML  SOLN COMPARISON:  Multiple priors, most recently CT the chest, abdomen and pelvis 02/06/2022. FINDINGS: CT CHEST FINDINGS Cardiovascular: Heart  size is normal. There is no significant pericardial fluid, thickening or pericardial calcification. There is aortic atherosclerosis, as well as atherosclerosis of the great vessels of the mediastinum and the coronary arteries, including calcified atherosclerotic plaque in the left main, left anterior descending, left circumflex and right coronary arteries. Right internal jugular single-lumen porta cath with tip terminating in the right atrium. Mediastinum/Nodes: No pathologically enlarged mediastinal or hilar lymph nodes. Esophagus is unremarkable in appearance. No axillary lymphadenopathy. Lungs/Pleura: Extensive architectural distortion and chronic volume loss is noted in the perihilar aspect of the right mid to upper lung, similar to the prior examination, most compatible with areas of chronic postradiation fibrosis. Associated with this is a moderate to large chronic right pleural effusion lying dependently which is similar to the prior study. 1.2 cm nodule in the right upper lobe near the apex (axial image 34 of series 4), stable. No definite new suspicious appearing pulmonary nodules or masses are noted. No left pleural effusion. Musculoskeletal: There are no aggressive appearing lytic or blastic lesions noted in the visualized portions of the skeleton. CT ABDOMEN PELVIS FINDINGS Hepatobiliary: Several hypovascular hepatic lesions are increased in size compared to the prior examination, concerning for progressive metastatic disease to the liver. These include a 3.5 x 2.7 cm lesion in the central aspect of segment 7/8 (axial image 45 of series 2), a 4.5 x 3.0 cm caudate lesion (axial image 54 of series 2), and a peripheral 3.3 x 2.6 cm lesion in segment 5 of the liver (axial image 57 of series 2) which distorts the overlying liver capsule. No intra or extrahepatic biliary ductal dilatation. Gallbladder is normal in appearance. Pancreas: No pancreatic mass. No pancreatic ductal dilatation. No pancreatic or  peripancreatic fluid collections or inflammatory changes. Spleen: Unremarkable. Adrenals/Urinary Tract: Multiple low-attenuation lesions in the right kidney, compatible with simple cysts, largest of which is in the interpolar region measuring 6.6 cm in diameter. Left kidney and left adrenal gland are normal in appearance. In the medial limb of the right adrenal gland there is a 1.4 cm nodule which is similar to the prior examination. No hydroureteronephrosis. Urinary bladder is normal in appearance. Stomach/Bowel: The appearance of the stomach is   normal. There is no pathologic dilatation of small bowel or colon. Small duodenal diverticulum extending off the posterior aspect of the second portion of the duodenum, without surrounding inflammatory changes to suggest an associated diverticulitis at this time. A few scattered colonic diverticulae are noted, without surrounding inflammatory changes to indicate colonic diverticulitis at this time. Normal appendix. Vascular/Lymphatic: Aortic atherosclerosis, without evidence of aneurysm or dissection in the abdominal or pelvic vasculature. Mildly enlarged hepatoduodenal ligament lymph node (axial image 59 of series 2) measuring 1.5 cm in short axis. No other lymphadenopathy noted in the abdomen or pelvis. Reproductive: Prostate gland and seminal vesicles are unremarkable in appearance. Other: No significant volume of ascites.  No pneumoperitoneum. Musculoskeletal: There are no aggressive appearing lytic or blastic lesions noted in the visualized portions of the skeleton. IMPRESSION: 1. Today's study demonstrates progression of disease, as demonstrated by enlargement of multiple hypovascular hepatic lesions highly concerning for metastatic lesions. Persistent mild enlargement of hepatoduodenal ligament lymph node concerning for additional site of metastatic involvement. 2. Previously noted right upper lobe pulmonary nodule is stable, as are chronic postradiation changes in  the right lung, as well as the chronic moderate to large right pleural effusion. 3. Stable small right adrenal nodule. 4. Aortic atherosclerosis, in addition to left main and three-vessel coronary artery disease. Assessment for potential risk factor modification, dietary therapy or pharmacologic therapy may be warranted, if clinically indicated. 5. Colonic diverticulosis without evidence of acute diverticulitis at this time. 6. Additional incidental findings, as above. Electronically Signed   By: Vinnie Langton M.D.   On: 04/18/2022 09:41   CT Abdomen Pelvis W Contrast  Result Date: 04/18/2022 CLINICAL DATA:  71 year old male with history of right-sided small cell lung cancer originally diagnosed 2 years ago. Radiation therapy complete. Chemotherapy ongoing. Follow-up study. * Tracking Code: BO * EXAM: CT CHEST, ABDOMEN, AND PELVIS WITH CONTRAST TECHNIQUE: Multidetector CT imaging of the chest, abdomen and pelvis was performed following the standard protocol during bolus administration of intravenous contrast. RADIATION DOSE REDUCTION: This exam was performed according to the departmental dose-optimization program which includes automated exposure control, adjustment of the mA and/or kV according to patient size and/or use of iterative reconstruction technique. CONTRAST:  143m OMNIPAQUE IOHEXOL 300 MG/ML  SOLN COMPARISON:  Multiple priors, most recently CT the chest, abdomen and pelvis 02/06/2022. FINDINGS: CT CHEST FINDINGS Cardiovascular: Heart size is normal. There is no significant pericardial fluid, thickening or pericardial calcification. There is aortic atherosclerosis, as well as atherosclerosis of the great vessels of the mediastinum and the coronary arteries, including calcified atherosclerotic plaque in the left main, left anterior descending, left circumflex and right coronary arteries. Right internal jugular single-lumen porta cath with tip terminating in the right atrium. Mediastinum/Nodes: No  pathologically enlarged mediastinal or hilar lymph nodes. Esophagus is unremarkable in appearance. No axillary lymphadenopathy. Lungs/Pleura: Extensive architectural distortion and chronic volume loss is noted in the perihilar aspect of the right mid to upper lung, similar to the prior examination, most compatible with areas of chronic postradiation fibrosis. Associated with this is a moderate to large chronic right pleural effusion lying dependently which is similar to the prior study. 1.2 cm nodule in the right upper lobe near the apex (axial image 34 of series 4), stable. No definite new suspicious appearing pulmonary nodules or masses are noted. No left pleural effusion. Musculoskeletal: There are no aggressive appearing lytic or blastic lesions noted in the visualized portions of the skeleton. CT ABDOMEN PELVIS FINDINGS Hepatobiliary: Several hypovascular hepatic lesions  are increased in size compared to the prior examination, concerning for progressive metastatic disease to the liver. These include a 3.5 x 2.7 cm lesion in the central aspect of segment 7/8 (axial image 45 of series 2), a 4.5 x 3.0 cm caudate lesion (axial image 54 of series 2), and a peripheral 3.3 x 2.6 cm lesion in segment 5 of the liver (axial image 57 of series 2) which distorts the overlying liver capsule. No intra or extrahepatic biliary ductal dilatation. Gallbladder is normal in appearance. Pancreas: No pancreatic mass. No pancreatic ductal dilatation. No pancreatic or peripancreatic fluid collections or inflammatory changes. Spleen: Unremarkable. Adrenals/Urinary Tract: Multiple low-attenuation lesions in the right kidney, compatible with simple cysts, largest of which is in the interpolar region measuring 6.6 cm in diameter. Left kidney and left adrenal gland are normal in appearance. In the medial limb of the right adrenal gland there is a 1.4 cm nodule which is similar to the prior examination. No hydroureteronephrosis. Urinary  bladder is normal in appearance. Stomach/Bowel: The appearance of the stomach is normal. There is no pathologic dilatation of small bowel or colon. Small duodenal diverticulum extending off the posterior aspect of the second portion of the duodenum, without surrounding inflammatory changes to suggest an associated diverticulitis at this time. A few scattered colonic diverticulae are noted, without surrounding inflammatory changes to indicate colonic diverticulitis at this time. Normal appendix. Vascular/Lymphatic: Aortic atherosclerosis, without evidence of aneurysm or dissection in the abdominal or pelvic vasculature. Mildly enlarged hepatoduodenal ligament lymph node (axial image 59 of series 2) measuring 1.5 cm in short axis. No other lymphadenopathy noted in the abdomen or pelvis. Reproductive: Prostate gland and seminal vesicles are unremarkable in appearance. Other: No significant volume of ascites.  No pneumoperitoneum. Musculoskeletal: There are no aggressive appearing lytic or blastic lesions noted in the visualized portions of the skeleton. IMPRESSION: 1. Today's study demonstrates progression of disease, as demonstrated by enlargement of multiple hypovascular hepatic lesions highly concerning for metastatic lesions. Persistent mild enlargement of hepatoduodenal ligament lymph node concerning for additional site of metastatic involvement. 2. Previously noted right upper lobe pulmonary nodule is stable, as are chronic postradiation changes in the right lung, as well as the chronic moderate to large right pleural effusion. 3. Stable small right adrenal nodule. 4. Aortic atherosclerosis, in addition to left main and three-vessel coronary artery disease. Assessment for potential risk factor modification, dietary therapy or pharmacologic therapy may be warranted, if clinically indicated. 5. Colonic diverticulosis without evidence of acute diverticulitis at this time. 6. Additional incidental findings, as above.  Electronically Signed   By: Daniel  Entrikin M.D.   On: 04/18/2022 09:41   DG CHEST PORT 1 VIEW  Result Date: 04/28/2022 CLINICAL DATA:  Status post right thoracentesis EXAM: PORTABLE CHEST 1 VIEW COMPARISON:  01/16/2022, 04/17/2022 FINDINGS: Two frontal views of the chest demonstrate a stable right chest wall port. Pigtail drainage catheter is seen overlying the medial right costophrenic angle, with marked decrease in right pleural effusion since prior exam. Stable post therapeutic changes in the right suprahilar region. No airspace disease or pneumothorax. No acute bony abnormalities. IMPRESSION: 1. Resolution of right pleural effusion after right pigtail drainage catheter placement. 2. Stable post therapeutic changes right suprahilar region. Electronically Signed   By: Michael  Brown M.D.   On: 04/28/2022 14:59    ASSESSMENT AND PLAN: This is a very pleasant 70 year old Caucasian male diagnosed with extensive stage (T4, N3, M1C) small cell lung cancer presented with large right   upper lobe lung mass in addition to mediastinal and right supraclavicular lymphadenopathy and multiple metastatic liver lesions diagnosed in April 2021. The patient initially underwent systemic chemotherapy with carboplatin for an AUC of 5 on day 1, etoposide 100 mg per metered squared on days 1, 2, and 3 in addition to [facility] for myeloprotection.  He also received immunotherapy with Imfinzi on day 1 of every chemotherapy cycle.  He is status post 9 cycles.  Starting from cycle #5 he was on single agent immunotherapy with Imfinzi IV every 4 weeks.  The patient had evidence of disease progression on his scan from December 2021.  He was then started on systemic chemotherapy with carboplatin for an AUC of 5 on day 1, etoposide 100 mg per metered squared on days 1, 2, and 3 in addition to Tecentriq 1200 mg on day 1, the patient is status post 9 cycles.  Starting from cycle #5, the patient started maintenance immunotherapy with  Tecentriq.  This was discontinued due to evidence of disease progression. The patient started third line treatment with Zepzelca (lurbinectedin) 3.2 Mg/M2 every 3-week status post 12 cycles of this treatment and he has been tolerating it fairly well except for fatigue.  This treatment was discontinued secondary to disease progression. He underwent fourth line treatment with systemic chemotherapy with irinotecan 65 Mg/M2 on days 1 and 8 every 3 weeks.  Status post 3 cycles.  He tolerated this treatment well with no concerning adverse effect except for mild fatigue. He had repeat CT scan of the chest, abdomen pelvis performed recently.  I personally and independently reviewed the scan images and discussed the results with the patient and his wife. Unfortunately his scan showed evidence for disease progression especially in the liver. I recommended for the patient to discontinue his current treatment with irinotecan at this point. I discussed with the patient several other options for management of his condition including palliative care and hospice versus consideration of treatment with either single agent Gemzar or paclitaxel.  He is also interested in repeating his initial treatment with carboplatin and etoposide again since he had good response to this treatment in the past.  I explained to the patient that I will probably give it some consideration. The patient was referred to Dr. Lennart Pall at [facility] for consideration of enrollment in the clinical trial with the [place].  He has not received his appointment yet. I discussed with him other treatment options if he is not eligible for the clinical trial and I will arrange the patient to start systemic chemotherapy with carboplatin for AUC of 5 on day 1 and 2 etoposide 100 Mg/M2 on days 1, 2 and 3 with Cosela support every 3 weeks on 05/21/2022 if he is not eligible for the clinical trial. I reminded the patient of the adverse effect of  this treatment including but not limited to alopecia, myelosuppression, nausea and vomiting, peripheral neuropathy, liver or renal dysfunction. The patient and his wife are in agreement with the current plan. He will come back for follow-up visit in around 2 weeks for evaluation before restarting his chemotherapy but if he is candidate for the trial, we will cancel the upcoming treatment with chemotherapy. The patient strongly indicated that he is not interested in palliative care and hospice at this point and he would like to continue more treatment. He was advised to call immediately if he has any other concerning symptoms in the interval.  The patient voices understanding of current  disease status and treatment options and is in agreement with the current care plan.  All questions were answered. The patient knows to call the clinic with any problems, questions or concerns. We can certainly see the patient much sooner if necessary.  The total time spent in the appointment was 30 minutes.  Disclaimer: This note was dictated with voice recognition software. Similar sounding words can inadvertently be transcribed and may not be corrected upon review. Eilleen Kempf, MD 05/08/22

## 2022-05-09 ENCOUNTER — Other Ambulatory Visit: Payer: Self-pay | Admitting: Physician Assistant

## 2022-05-09 ENCOUNTER — Telehealth: Payer: Self-pay | Admitting: Medical Oncology

## 2022-05-09 ENCOUNTER — Other Ambulatory Visit: Payer: Self-pay | Admitting: Cardiology

## 2022-05-09 DIAGNOSIS — C3411 Malignant neoplasm of upper lobe, right bronchus or lung: Secondary | ICD-10-CM

## 2022-05-09 NOTE — Telephone Encounter (Signed)
Adam Wise referred pt to [facility] for the [place] study.

## 2022-05-09 NOTE — Telephone Encounter (Signed)
Records faxed to [facility] oncology department

## 2022-05-09 NOTE — Telephone Encounter (Signed)
Dr. Mariel Kansky is out on emergency.  Dr Posey Pronto said the [place] study is only available for 1st line maintenance . Pt does not qualify.  In a few more weeks they will  have more studies.   [facility] may have the study for later line tx.   Do you still want Dr Posey Pronto to see pt?   Call her @ [phone].

## 2022-05-09 NOTE — Telephone Encounter (Signed)
DLL-Bite Study-  Kory in research is f/u to see if [facility] has the [place] study for later line treatment of [place].

## 2022-05-13 ENCOUNTER — Other Ambulatory Visit: Payer: Medicare Other

## 2022-05-13 ENCOUNTER — Ambulatory Visit: Payer: Medicare Other

## 2022-05-13 ENCOUNTER — Ambulatory Visit: Payer: Medicare Other | Admitting: Physician Assistant

## 2022-05-13 ENCOUNTER — Telehealth: Payer: Self-pay

## 2022-05-13 ENCOUNTER — Ambulatory Visit: Payer: Medicare Other | Admitting: Adult Health

## 2022-05-13 ENCOUNTER — Telehealth: Payer: Self-pay | Admitting: Medical Oncology

## 2022-05-13 NOTE — Telephone Encounter (Signed)
Pt Lm wanting to know he heard back from [facility] and was offered an appt on 05/21/22 but he has appts here at [facility]. Pt wants to know what this means for his infusion scheduled here. I have advised the pt to accept the appt at [facility]. Pt expressed understanding of this information.

## 2022-05-13 NOTE — Telephone Encounter (Signed)
Clinical trial study - pt notified of referral  to [facility] for the [place] study. He has not heard from them .  I left VM at [facility] to return my call about his referral .

## 2022-05-15 ENCOUNTER — Ambulatory Visit (INDEPENDENT_AMBULATORY_CARE_PROVIDER_SITE_OTHER)
Admission: RE | Admit: 2022-05-15 | Discharge: 2022-05-15 | Disposition: A | Discharge status: Home / Self Care | Payer: Medicare Other | Source: Ambulatory Visit | Attending: Primary Care | Admitting: Primary Care

## 2022-05-15 ENCOUNTER — Encounter: Payer: Self-pay | Admitting: Primary Care

## 2022-05-15 ENCOUNTER — Ambulatory Visit (INDEPENDENT_AMBULATORY_CARE_PROVIDER_SITE_OTHER): Payer: Medicare Other | Admitting: Primary Care

## 2022-05-15 VITALS — BP 138/82 | HR 76 | Temp 98.2°F | Ht 69.0 in | Wt 287.0 lb

## 2022-05-15 DIAGNOSIS — E1165 Type 2 diabetes mellitus with hyperglycemia: Secondary | ICD-10-CM

## 2022-05-15 DIAGNOSIS — R051 Acute cough: Secondary | ICD-10-CM | POA: Diagnosis not present

## 2022-05-15 DIAGNOSIS — Z794 Long term (current) use of insulin: Secondary | ICD-10-CM | POA: Diagnosis not present

## 2022-05-15 DIAGNOSIS — J90 Pleural effusion, not elsewhere classified: Secondary | ICD-10-CM | POA: Diagnosis not present

## 2022-05-15 MED ORDER — DOXYCYCLINE HYCLATE 100 MG PO TABS: 100.0000 mg | ORAL_TABLET | Freq: Two times a day (BID) | ORAL | 0 refills | Status: DC

## 2022-05-15 MED ORDER — PREDNISONE 20 MG PO TABS: ORAL_TABLET | ORAL | 0 refills | Status: DC

## 2022-05-15 MED ORDER — BD PEN NEEDLE NANO U/F 32G X 4 MM MISC: 3 refills | Status: DC

## 2022-05-15 NOTE — Assessment & Plan Note (Signed)
Symptoms suspicious for COPD exacerbation. Need to rule out progressing pleural effusion vs pneumonia, especially given recent procedure.  Chest xray pending today.  Start doxycycline 100 mg BID x 7 days. Start prednisone 40 mg daily x 5 days.  Strict return precautions provided.

## 2022-05-15 NOTE — Patient Instructions (Signed)
Complete xray(s) prior to leaving today. I will notify you of your results once received.  Start Doxycycline antibiotic for the infection. Take 1 tablet by mouth twice daily for 7 days.  Start prednisone 20 mg tablets. Take 2 tablets by mouth once daily for 5 days.  It was a pleasure to see you today!

## 2022-05-15 NOTE — Progress Notes (Signed)
Subjective:    Patient ID: Adam Wise, male    DOB: February 15, 1951, 71 y.o.   MRN: [id number]  HPI  Adam Wise is a very pleasant 71 y.o. 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  He is also needing a refill of his pen needles for insulin.   Symptom onset about 1 to 2 weeks ago with persistent sneezing. He then developed sore throat, post nasal drip, rhinorrhea, cough, wheezing, fatigue, chest tightness. Today he's feeling much worse than he did a few days ago.   He's been using Flonase nasal spray, Tylenol Sinus, Cepacol drops. He took a home Covid-19 test two days ago which was negative. He underwent chemical pleurodesis via chest tube on 04/28/22, his recent symptoms began after this procedure.    Review of Systems  Constitutional:  Positive for fatigue. Negative for fever.  HENT:  Positive for congestion, postnasal drip, rhinorrhea and sore throat.   Respiratory:  Positive for cough, chest tightness and shortness of breath.         Past Medical History:  Diagnosis Date   Chickenpox    Chronic knee pain    Chronic low back pain    Coronary artery calcification seen on CAT scan 11/2021   Coronary CTA 11/27/2021: Moderate to large right pleural effusion and compressive atelectasis right lung base. => Coronary Calcium Score 657.  Diffuse RCA calcification.  Minimal mild disease in the LAD and diagonal branches. == Overall limited study.  Notable artifact.   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 He has disease progression after the first line of chemotherapy in December 2021.   Testosterone deficiency    Type 2 diabetes mellitus ([place])     Social History   Socioeconomic History   Marital status: Married    Spouse name: Not on file   Number of children: Not on file   Years of education: Not on file   Highest education level: Not on file  Occupational History   Not on file  Tobacco Use   Smoking status: Former   Smokeless tobacco: Never  Vaping Use   Vaping Use: Never used  Substance and Sexual Activity   Alcohol use: Not Currently    Comment: rarely   Drug use: Not Currently   Sexual activity: Yes  Other Topics Concern   Not on file  Social History Narrative   Married.   Moved from [place].   Retired.   Social Determinants of Health   Financial Resource Strain: Low Risk    Difficulty of Paying Living Expenses: Not hard at all  Food Insecurity: No Food Insecurity   Worried About Charity fundraiser in the Last Year: Never true   [place] in the Last Year: Never true  Transportation Needs: No Transportation Needs   Lack of Transportation (Medical): No   Lack of Transportation (Non-Medical): No  Physical Activity: Inactive   Days of Exercise per Week: 0 days   Minutes of Exercise per Session: 0 min  Stress: No Stress Concern  Present   Feeling of Stress : Only a little  Social Connections: Moderately Integrated   Frequency of Communication with Friends and Family: Twice a week   Frequency of Social Gatherings with Friends and Family: Twice a week   Attends Religious Services: 1 to 4 times per year   Active Member of [employer] or Organizations: No   Attends Archivist Meetings: Never   Marital Status: Married  Human resources officer Violence: Not At Risk   Fear of Current or Ex-Partner: No   Emotionally Abused: No   Physically Abused: No   Sexually Abused: No    Past Surgical History:  Procedure Laterality Date   CHEST TUBE INSERTION Right  01/20/2022   Procedure: CHEST TUBE INSERTION;  Surgeon: Candee Furbish, MD;  Location: [facility] ENDOSCOPY;  Service: Pulmonary;  Laterality: Right;  w/ Talc Pleurodesis, planned admit for Obs afterwards   CHEST TUBE INSERTION Right 04/28/2022   Procedure: INSERTION PLEURAL DRAINAGE CATHETER - Pigtail tail, drainage;  Surgeon: Candee Furbish, MD;  Location: [facility] ENDOSCOPY;  Service: Pulmonary;  Laterality: Right;  talc pleurodesis   COLONOSCOPY WITH PROPOFOL N/A 12/17/2018   Procedure: COLONOSCOPY WITH PROPOFOL;  Surgeon: Virgel Manifold, MD;  Location: [facility] ENDOSCOPY;  Service: Gastroenterology;  Laterality: N/A;   IR IMAGING GUIDED PORT INSERTION  04/17/2020   IR THORACENTESIS ASP PLEURAL SPACE W/IMG GUIDE  11/29/2021   IR THORACENTESIS ASP PLEURAL SPACE W/IMG GUIDE  01/03/2022   JOINT REPLACEMENT Bilateral    REPLACEMENT TOTAL KNEE BILATERAL  2015   TONSILLECTOMY  1960   TRANSTHORACIC ECHOCARDIOGRAM  05/2020   a) 05/2020: EF 55 to 60%.  No R WMA.  Mild LVH.  Indeterminate LVEDP.  Unable to assess RVP.  Normal aortic and mitral valves.  Mildly elevated RAP.; b) 09/2021: EF 50-55%. No RWMA. Mild LVH. ~ LVEDP. Mild LA Dilation. NORMAL RV/RAP.  Normal MV/AoV.    Family History  Problem Relation Age of Onset   Heart attack Mother    Cancer Mother    Hypertension Mother    Arthritis Father    Asthma Father    Cancer Father    COPD Father    Heart attack Father    Hypertension Sister    Cancer Sister    Diabetes Sister    Asthma Sister    Cancer Sister    COPD Sister    Arthritis Sister    Asthma Sister    Diabetes Sister    Birth defects Maternal Grandfather    Arthritis Paternal Grandmother    Diabetes Paternal Grandmother    Arthritis Paternal Grandfather    Asthma Son    Other Neg Hx        pituitary disorder    Allergies  Allergen Reactions   Bupropion     Racing heart   Hydrochlorothiazide Other (See Comments)    Cramping to lower extremities    Current Outpatient  Medications on File Prior to Visit  Medication Sig Dispense Refill   albuterol (VENTOLIN HFA) 108 (90 Base) MCG/ACT inhaler Inhale 2 puffs into the lungs every 6 (six) hours as needed for wheezing or shortness of breath. 18 g 0   amLODipine (NORVASC) 10 MG tablet TAKE 1 TABLET BY MOUTH EVERY DAY FOR BLOOD PRESSURE 90 tablet 3   B-D 3CC LUER-LOK SYR 22GX1" 22G X 1" 3 ML MISC USE AS INSTRUCTED FOR TESTOSTERONE INJECTION EVERY 2 WEEKS 10 each 2   bisoprolol (ZEBETA) 5 MG tablet TAKE 1 TABLET (5  MG TOTAL) BY MOUTH DAILY. 90 tablet 2   Black Pepper-Turmeric (TURMERIC COMPLEX/BLACK PEPPER PO) Take 1 tablet by mouth in the morning and at bedtime.     blood glucose meter kit and supplies KIT Dispense based on patient and insurance preference. Use up to four times daily as directed. 1 each 0   chlorpheniramine-HYDROcodone (TUSSIONEX) 10-8 MG/5ML SUER Take 5 mLs by mouth at bedtime as needed for cough. 473 mL 0   ELIQUIS 5 MG TABS tablet TAKE 1 TABLET BY MOUTH TWICE A DAY 180 tablet 2   esomeprazole (NEXIUM) 20 MG capsule Take 20 mg by mouth in the morning.     gabapentin (NEURONTIN) 300 MG capsule Take 1 capsule (300 mg total) by mouth 2 (two) times daily. For back pain. 180 capsule 1   glipiZIDE (GLUCOTROL) 10 MG tablet TAKE 1 TABLET (10 MG TOTAL) BY MOUTH 2 (TWO) TIMES DAILY BEFORE A MEAL. FOR DIABETES. 180 tablet 3   Insulin Glargine (BASAGLAR KWIKPEN) 100 UNIT/ML Inject 20 Units into the skin at bedtime. For diabetes. 30 mL 1   Insulin Pen Needle (BD PEN NEEDLE NANO U/F) 32G X 4 MM MISC Use with insulin as prescribed  Dx Code: E11.9 100 each 2   ipratropium-albuterol (DUONEB) 0.5-2.5 (3) MG/3ML SOLN Take 3 mLs by nebulization every 4 (four) hours while awake for 3 days, THEN 3 mLs every 4 (four) hours as needed (shortness of breath or wheezing). 360 mL 0   Lancets MISC USE UP TO 3 TIMES DAILY AS DIRECTED 100 each 2   losartan (COZAAR) 50 MG tablet Take 1 tablet (50 mg total) by mouth daily. For blood  pressure. 90 tablet 3   Melatonin 10 MG CAPS Take 10 mg by mouth at bedtime.     metFORMIN (GLUCOPHAGE) 1000 MG tablet TAKE 1 TABLET (1,000 MG TOTAL) BY MOUTH 2 (TWO) TIMES DAILY WITH A MEAL. FOR DIABETES 180 tablet 1   Multiple Vitamin (MULTI-VITAMINS) TABS Take 1 tablet by mouth in the morning.     Omega-3 Fatty Acids (SALMON OIL PO) Take 1 capsule by mouth daily.     ONETOUCH ULTRA test strip USE UP TO 4 TIMES DAILY AS DIRECTED 100 strip 5   oxymetazoline (AFRIN) 0.05 % nasal spray Place 1 spray into both nostrils daily.     pioglitazone (ACTOS) 45 MG tablet TAKE 1 TABLET (45 MG TOTAL) BY MOUTH DAILY. FOR DIABETES. 90 tablet 1   pravastatin (PRAVACHOL) 40 MG tablet TAKE 1 TABLET BY MOUTH EVERY DAY IN THE EVENING FOR CHOLESTEROL 90 tablet 1   testosterone cypionate (DEPOTESTOSTERONE CYPIONATE) 200 MG/ML injection Inject 200 mg into the muscle every 14 (fourteen) days.     No current facility-administered medications on file prior to visit.    BP 138/82   Pulse 76   Temp 98.2 F (36.8 C) (Oral)   Ht _0  (1.753 m)   Wt 287 lb (130.2 kg)   SpO2 95%   BMI 42.38 kg/m  Objective:   Physical Exam Constitutional:      General: He is not in acute distress.    Appearance: He is ill-appearing.  HENT:     Right Ear: Tympanic membrane and ear canal normal.     Left Ear: Tympanic membrane and ear canal normal.  Cardiovascular:     Rate and Rhythm: Normal rate.  Pulmonary:     Breath sounds: Examination of the left-upper field reveals rhonchi. Examination of the left-lower field reveals rhonchi. Rhonchi present.  Comments: Audible wheezing         Assessment & Plan:

## 2022-05-20 ENCOUNTER — Ambulatory Visit: Payer: Medicare Other

## 2022-05-20 ENCOUNTER — Other Ambulatory Visit: Payer: Medicare Other

## 2022-05-21 ENCOUNTER — Inpatient Hospital Stay: Payer: Medicare Other

## 2022-05-21 ENCOUNTER — Inpatient Hospital Stay: Payer: Medicare Other | Admitting: Physician Assistant

## 2022-05-21 DIAGNOSIS — C3411 Malignant neoplasm of upper lobe, right bronchus or lung: Secondary | ICD-10-CM | POA: Diagnosis not present

## 2022-05-21 DIAGNOSIS — Z87891 Personal history of nicotine dependence: Secondary | ICD-10-CM | POA: Diagnosis not present

## 2022-05-21 DIAGNOSIS — C3490 Malignant neoplasm of unspecified part of unspecified bronchus or lung: Secondary | ICD-10-CM | POA: Diagnosis not present

## 2022-05-21 DIAGNOSIS — C770 Secondary and unspecified malignant neoplasm of lymph nodes of head, face and neck: Secondary | ICD-10-CM | POA: Diagnosis not present

## 2022-05-22 ENCOUNTER — Inpatient Hospital Stay: Payer: Medicare Other

## 2022-05-22 ENCOUNTER — Ambulatory Visit: Payer: Medicare Other

## 2022-05-22 ENCOUNTER — Telehealth: Payer: Self-pay

## 2022-05-22 ENCOUNTER — Telehealth: Payer: Self-pay | Admitting: Medical Oncology

## 2022-05-22 NOTE — Telephone Encounter (Signed)
Pt wife called and they indiated they have decided against the clinical trial and would like to get tx here started.

## 2022-05-22 NOTE — Telephone Encounter (Signed)
He declined the clinical trail at [facility] with Dr Mindi Junker. It was a 5 day regimen and he could not travel that long. He wants to see [facility].   Does Dr. Julien Nordmann  want to move up his appt from 6/28.?

## 2022-05-23 ENCOUNTER — Inpatient Hospital Stay: Payer: Medicare Other

## 2022-05-26 NOTE — Progress Notes (Unsigned)
Adam Wise OFFICE PROGRESS NOTE  [name], NP [place] [address]  DIAGNOSIS:  Relapsed extensive stage (T4, N3, M1c)  small cell lung cancer diagnosed in April 2021 and presented with large right upper lobe/right hilar mass with ipsilateral and contralateral mediastinal and right supraclavicular lymphadenopathy in addition to multiple liver lesions. He has disease progression after the first line of chemotherapy in December 2021.  PRIOR THERAPY: 1) Palliative radiotherapy to the right upper lobe lung mass under the care of Dr. Lisbeth Renshaw. 2) Systemic chemotherapy with carboplatin for AUC of 5 on day 1, etoposide 100 mg/M2 on days 1, 2 and 3 in addition to Imfinzi 1500 mg IV every 3 weeks with chemotherapy then every 4 weeks for maintenance if the patient has no evidence for progression.  He will also receive Cosela 240 mg/m2 on the days of the chemotherapy.  Status post 9 cycles.  Starting from cycle #5 the patient will be on maintenance treatment with immunotherapy with Imfinzi 1500 mg IV every 4 weeks. Last dose of chemotherapy was given on November 13, 2020. This treatment was discontinued secondary to disease progression 3) Systemic chemotherapy with carboplatin for AUC of 5 on day 1, etoposide 100 mg/M2 on days 1, 2 and 3, Tecentriq 1200 mg IV every 3 weeks as well as Cosela 250 mg/M2 on the days of the chemotherapy every 3 weeks.  First dose December 18, 2020.  Status post 8 cycles. 4) Zepzelca 3.2 mgm2 IV every 3 weeks. Last dose on 01/23/22. Status post 12 cycles 5) Palliative systemic chemotherapy with irinotecan 65 mg/m2 on days 1 and 8 IV every 3 weeks.  Status post 3 cycles.  Last dose was given April 01, 2022 discontinued secondary to disease progression   CURRENT THERAPY: Patient referred to [facility] for ETCTN [id number] clinical trial which he declined due to travel burden. We are trying again with palliative systemic chemotherapy with carboplatin for an AC of  5 on day 1, etoposide 100 mg per metered squared on days 1, 2, and 3 with Cosela before chemotherapy.  First dose expected on 05/28/22  INTERVAL HISTORY: Adam Wise 71 y.o. male returns to the clinic today for a follow-up visit accompanied by his wife.  The patient was last seen by Dr. Julien Nordmann on 05/08/22.  At that point in time, the patient had evidence of disease progression.  Dr. Julien Nordmann had referred him to Dr. Mariel Kansky at [facility] for evaluation of enrollment into the [place]. He then was referred to [facility] Dr. Mindi Junker to see if he was a candidate for the ET CTN [id number] clinical trial.  The patient declined this due to the travel burden.  The patient opted to proceed with retrialing the systemic chemotherapy with carboplatin and etoposide.   Since last being seen, the patient is feeling at his baseline except him and his wife are recovering from a sinus infection. He recently completed a course of anti-biotics.  He denies any fever, chills, night sweats, or unexplained weight loss.  He continues to have dyspnea on exertion which is stable.  Follows closely with pulmonology regarding his history of audible wheezing and recurrent pleural effusions. He had a chest x-ray yesterday which did not show worsening effusion. He sometimes has a cough and states it is hard to say the severity in the setting of his recent sinus infection.  Denies any hemoptysis or chest pain.  Denies any nausea, vomiting, diarrhea, or constipation.  Denies any headache or visual changes.  Denies any rashes or skin changes.  He is here today for evaluation and repeat blood work before starting cycle #1.   MEDICAL HISTORY: Past Medical History:  Diagnosis Date   Chickenpox    Chronic knee pain    Chronic low back pain    Coronary artery calcification seen on CAT scan 11/2021   Coronary CTA 11/27/2021: Moderate to large right pleural effusion and compressive atelectasis right lung base. => Coronary Calcium  Score 657.  Diffuse RCA calcification.  Minimal mild disease in the LAD and diagonal branches. == Overall limited study.  Notable artifact.   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 He has disease progression after the first line of chemotherapy in December 2021.   Testosterone deficiency    Type 2 diabetes mellitus (HCC)     ALLERGIES:  is allergic to bupropion and hydrochlorothiazide.  MEDICATIONS:  Current Outpatient Medications  Medication Sig Dispense Refill   albuterol (VENTOLIN HFA) 108 (90 Base) MCG/ACT inhaler Inhale 2 puffs into the lungs every 6 (six) hours as needed for wheezing or shortness of breath. 18 g 0   amLODipine (NORVASC) 10 MG tablet TAKE 1 TABLET BY MOUTH EVERY DAY FOR BLOOD PRESSURE 90 tablet 3   B-D 3CC LUER-LOK SYR 22GX1" 22G X 1" 3 ML MISC USE AS INSTRUCTED FOR TESTOSTERONE INJECTION EVERY 2 WEEKS 10 each 2   bisoprolol (ZEBETA) 5 MG tablet TAKE 1 TABLET (5 MG TOTAL) BY MOUTH DAILY. 90 tablet 2   Black Pepper-Turmeric (TURMERIC COMPLEX/BLACK PEPPER PO) Take 1 tablet by mouth in the morning and at bedtime.     blood glucose meter kit and supplies KIT Dispense based on patient and insurance preference. Use up to four times daily as directed. 1 each 0   chlorpheniramine-HYDROcodone (TUSSIONEX) 10-8 MG/5ML SUER Take 5 mLs by mouth at bedtime as needed for cough. 473 mL 0   ELIQUIS 5 MG TABS tablet TAKE 1 TABLET BY MOUTH TWICE A DAY 180 tablet 2   esomeprazole (NEXIUM) 20 MG capsule Take 20 mg by mouth in the morning.     gabapentin (NEURONTIN) 300 MG capsule Take 1 capsule (300 mg total) by mouth 2 (two) times daily. For back  pain. 180 capsule 1   glipiZIDE (GLUCOTROL) 10 MG tablet TAKE 1 TABLET (10 MG TOTAL) BY MOUTH 2 (TWO) TIMES DAILY BEFORE A MEAL. FOR DIABETES. 180 tablet 3   Insulin Glargine (BASAGLAR KWIKPEN) 100 UNIT/ML Inject 20 Units into the skin at bedtime. For diabetes. 30 mL 1   Insulin Pen Needle (BD PEN NEEDLE NANO U/F) 32G X 4 MM MISC Use with insulin as prescribed Dx Code: E11.9 200 each 3   ipratropium-albuterol (DUONEB) 0.5-2.5 (3) MG/3ML SOLN Take 3 mLs by nebulization every 4 (four) hours while awake for 3 days, THEN 3 mLs every 4 (four) hours as needed (shortness of breath or wheezing). 360 mL 0   Lancets MISC USE UP TO 3 TIMES DAILY AS DIRECTED 100 each 2   losartan (COZAAR) 50 MG tablet Take 1 tablet (50 mg total) by mouth daily. For blood pressure. 90 tablet 3   Melatonin 10  MG CAPS Take 10 mg by mouth at bedtime.     metFORMIN (GLUCOPHAGE) 1000 MG tablet TAKE 1 TABLET (1,000 MG TOTAL) BY MOUTH 2 (TWO) TIMES DAILY WITH A MEAL. FOR DIABETES 180 tablet 1   Multiple Vitamin (MULTI-VITAMINS) TABS Take 1 tablet by mouth in the morning.     Omega-3 Fatty Acids (SALMON OIL PO) Take 1 capsule by mouth daily.     ONETOUCH ULTRA test strip USE UP TO 4 TIMES DAILY AS DIRECTED 100 strip 5   oxymetazoline (AFRIN) 0.05 % nasal spray Place 1 spray into both nostrils daily.     pioglitazone (ACTOS) 45 MG tablet TAKE 1 TABLET (45 MG TOTAL) BY MOUTH DAILY. FOR DIABETES. 90 tablet 1   pravastatin (PRAVACHOL) 40 MG tablet TAKE 1 TABLET BY MOUTH EVERY DAY IN THE EVENING FOR CHOLESTEROL 90 tablet 1   testosterone cypionate (DEPOTESTOSTERONE CYPIONATE) 200 MG/ML injection Inject 200 mg into the muscle every 14 (fourteen) days.     No current facility-administered medications for this visit.    SURGICAL HISTORY:  Past Surgical History:  Procedure Laterality Date   CHEST TUBE INSERTION Right 01/20/2022   Procedure: CHEST TUBE INSERTION;  Surgeon: Candee Furbish, MD;  Location: [facility] ENDOSCOPY;  Service: Pulmonary;   Laterality: Right;  w/ Talc Pleurodesis, planned admit for Obs afterwards   CHEST TUBE INSERTION Right 04/28/2022   Procedure: INSERTION PLEURAL DRAINAGE CATHETER - Pigtail tail, drainage;  Surgeon: Candee Furbish, MD;  Location: [facility] ENDOSCOPY;  Service: Pulmonary;  Laterality: Right;  talc pleurodesis   COLONOSCOPY WITH PROPOFOL N/A 12/17/2018   Procedure: COLONOSCOPY WITH PROPOFOL;  Surgeon: Virgel Manifold, MD;  Location: [facility] ENDOSCOPY;  Service: Gastroenterology;  Laterality: N/A;   IR IMAGING GUIDED PORT INSERTION  04/17/2020   IR THORACENTESIS ASP PLEURAL SPACE W/IMG GUIDE  11/29/2021   IR THORACENTESIS ASP PLEURAL SPACE W/IMG GUIDE  01/03/2022   JOINT REPLACEMENT Bilateral    REPLACEMENT TOTAL KNEE BILATERAL  2015   TONSILLECTOMY  1960   TRANSTHORACIC ECHOCARDIOGRAM  05/2020   a) 05/2020: EF 55 to 60%.  No R WMA.  Mild LVH.  Indeterminate LVEDP.  Unable to assess RVP.  Normal aortic and mitral valves.  Mildly elevated RAP.; b) 09/2021: EF 50-55%. No RWMA. Mild LVH. ~ LVEDP. Mild LA Dilation. NORMAL RV/RAP.  Normal MV/AoV.    REVIEW OF SYSTEMS:   Constitutional: Negative for appetite change, chills, fatigue, fever and unexpected weight change.  HENT: Positive for recent sinus infection. Negative for mouth sores, nosebleeds, sore throat and trouble swallowing.   Eyes: Negative for eye problems and icterus.  Respiratory: Positive for dyspnea on exertion. Negative for hemoptysis. Positive for intermittent wheezing (none today). Cardiovascular: Negative for chest pain and leg swelling.  Gastrointestinal: Negative for abdominal pain, constipation, diarrhea, nausea and vomiting.  Genitourinary: Negative for bladder incontinence, difficulty urinating, dysuria, frequency and hematuria.   Musculoskeletal: Negative for  gait problem, neck pain and neck stiffness.  Skin: Negative for itching and rash.  Neurological: Negative for dizziness, extremity weakness, gait problem, headaches,  light-headedness and seizures.  Hematological: Negative for adenopathy. Does not bruise/bleed easily.  Psychiatric/Behavioral: Negative for confusion, depression and sleep disturbance. The patient is not nervous/anxious.   PHYSICAL EXAMINATION:  Blood pressure 127/73, pulse 87, temperature (!) 97.5 F (36.4 C), temperature source Tympanic, resp. rate 18, height 5' 8.5" (1.74 m), weight 285 lb 9.6 oz (129.5 kg), SpO2 93 %.  ECOG PERFORMANCE STATUS: 1  Physical Exam  Constitutional: Oriented  to person, place, and time and well-developed, well-nourished, and in no distress.  HENT:  Head: Normocephalic and atraumatic.  Mouth/Throat: Oropharynx is clear and moist. No oropharyngeal exudate.  Eyes: Conjunctivae are normal. Right eye exhibits no discharge. Left eye exhibits no discharge. No scleral icterus.  Neck: Normal range of motion. Neck supple.  Cardiovascular: Normal rate, regular rhythm, normal heart sounds and intact distal pulses.   Pulmonary/Chest: Effort normal and breath sounds normal. No respiratory distress. No wheezes. No rales.  Abdominal: Soft. Bowel sounds are normal. Exhibits no distension and no mass. There is no tenderness.  Musculoskeletal: Normal range of motion. Exhibits no edema.  Lymphadenopathy:    No cervical adenopathy.  Neurological: Alert and oriented to person, place, and time. Exhibits normal muscle tone. Gait normal. Coordination normal.  Skin: Skin is warm and dry. No rash noted. Not diaphoretic. No erythema. No pallor.  Psychiatric: Mood, memory and judgment normal.  Vitals reviewed.  LABORATORY DATA: Lab Results  Component Value Date   WBC 8.2 05/28/2022   HGB 12.0 (L) 05/28/2022   HCT 36.8 (L) 05/28/2022   MCV 89.8 05/28/2022   PLT 194 05/28/2022      Chemistry      Component Value Date/Time   NA 138 05/28/2022 0941   K 4.4 05/28/2022 0941   CL 103 05/28/2022 0941   CO2 27 05/28/2022 0941   BUN 16 05/28/2022 0941   CREATININE 1.21  05/28/2022 0941   CREATININE 1.45 (H) 10/18/2021 1533      Component Value Date/Time   CALCIUM 9.4 05/28/2022 0941   ALKPHOS 72 05/28/2022 0941   AST 21 05/28/2022 0941   ALT 23 05/28/2022 0941   BILITOT 0.3 05/28/2022 0941       RADIOGRAPHIC STUDIES:  DG Chest 2 View  Result Date: 05/27/2022 CLINICAL DATA:  Pleural effusion EXAM: CHEST - 2 VIEW COMPARISON:  05/15/2022 FINDINGS: Right chest wall port catheter is unchanged. Stable lung aeration with right perihilar fibrotic changes. Decreased right pleural effusion. Stable cardiomediastinal contours. No acute osseous abnormality. IMPRESSION: Decreased right pleural effusion. Electronically Signed   By: Macy Mis M.D.   On: 05/27/2022 12:14   DG Chest 2 View  Result Date: 05/16/2022 CLINICAL DATA:  71 year old male with history of cough and shortness of breath for 1 week. History of stage IV lung cancer. EXAM: CHEST - 2 VIEW COMPARISON:  Chest x-ray 04/28/2022. FINDINGS: Right internal jugular single-lumen porta cath with tip terminating at the superior cavoatrial junction. Mass-like area of architectural distortion in the perihilar aspect of the right mid to upper lung, similar to prior examinations, corresponding with areas of chronic postradiation mass-like fibrosis. Moderate right pleural effusion and elevation of the right hemidiaphragm is also similar to prior studies. Left lung is clear. No left pleural effusion. No pneumothorax. No evidence of pulmonary edema. Heart size is normal. IMPRESSION: 1. No radiographic evidence of acute cardiopulmonary disease. The appearance the chest is similar to prior studies, as above. Electronically Signed   By: Vinnie Langton M.D.   On: 05/16/2022 12:12   DG CHEST PORT 1 VIEW  Result Date: 04/28/2022 CLINICAL DATA:  Status post right thoracentesis EXAM: PORTABLE CHEST 1 VIEW COMPARISON:  01/16/2022, 04/17/2022 FINDINGS: Two frontal views of the chest demonstrate a stable right chest wall port.  Pigtail drainage catheter is seen overlying the medial right costophrenic angle, with marked decrease in right pleural effusion since prior exam. Stable post therapeutic changes in the right suprahilar region. No airspace disease or  pneumothorax. No acute bony abnormalities. IMPRESSION: 1. Resolution of right pleural effusion after right pigtail drainage catheter placement. 2. Stable post therapeutic changes right suprahilar region. Electronically Signed   By: Randa Ngo M.D.   On: 04/28/2022 14:59     ASSESSMENT/PLAN:  This is a very pleasant 71 year old Caucasian male diagnosed with extensive stage (T4, N3, M1C) small cell lung cancer presented with large right upper lobe lung mass in addition to mediastinal and right supraclavicular lymphadenopathy and multiple metastatic liver lesions diagnosed in April 2021.   The patient initially underwent systemic chemotherapy with carboplatin for an AUC of 5 on day 1, etoposide 100 mg per metered squared on days 1, 2, and 3 in addition to [facility] for myeloprotection.  He also received immunotherapy with Imfinzi on day 1 of every chemotherapy cycle.  He is status post 8 cycles.  Starting from cycle #5 he was on single agent immunotherapy with Imfinzi IV every 4 weeks  The patient had evidence of disease progression on his scan from December 2021.   He was then started on systemic chemotherapy with carboplatin for an AUC of 5 on day 1, etoposide 100 mg per metered squared on days 1, 2, and 3 in addition to Tecentriq 1200 mg on day 1, the patient is status post 8 cycles.  Starting from cycle #5, the patient started maintenance immunotherapy with Tecentriq.  This was discontinued due to evidence of disease progression.  The patient then underwent Zepzelca IV every 3 weeks.  He is status post his 12 cycles and he tolerated it well except for fatigue a few days after treatment.  This was discontinued in February 2023 due to evidence of disease progression.    The  patient then underwent treatment with irinotecan 65 mg per metered squared on days 1 and 8 IV every 3 weeks.  The patient is status post 3 cycles and tolerated it fairly well without any concerning adverse side effects.  This was discontinued secondary to disease progression.  Dr. Julien Nordmann saw the patient after this to discuss the options including palliative care and hospice versus single agent Gemzar, or Taxol.  He also discussed repeating his initial treatment with carboplatin and etoposide since he had a good response to treatment in the past..   He was referred to Dr. Mariel Kansky at [facility] for consideration of enrollment in the clinical trial with [email or web address].   He also saw Dr. Mindi Junker at [facility] for consideration of enrollment in the ETCTN [id number] trial.  The patient was not interested due to the travel burden.  The patient is interested in restarting systemic chemotherapy with carboplatin for an AUC of 5 on day 1 and etoposide 100 mg per metered squared on days 1, 2, and 3 with Cosela.   The patient is here for his first cycle.  Labs were reviewed.  Recommend that he proceed with cycle #1 today as scheduled.  We will see him back for follow-up visit in 3 weeks for evaluation and repeat blood work before starting cycle number 2.  The patient was advised to call immediately if he has any concerning symptoms in the interval. The patient voices understanding of current disease status and treatment options and is in agreement with the current care plan. All questions were answered. The patient knows to call the clinic with any problems, questions or concerns. We can certainly see the patient much sooner if necessary  No orders of the defined types were placed in this encounter.    The total time spent in the appointment was 20-29 minutes.   Caulin Begley L Jochebed Bills, PA-C 05/28/22

## 2022-05-27 ENCOUNTER — Ambulatory Visit (INDEPENDENT_AMBULATORY_CARE_PROVIDER_SITE_OTHER): Payer: Medicare Other | Admitting: Adult Health

## 2022-05-27 ENCOUNTER — Encounter: Payer: Self-pay | Admitting: Adult Health

## 2022-05-27 ENCOUNTER — Ambulatory Visit (INDEPENDENT_AMBULATORY_CARE_PROVIDER_SITE_OTHER): Payer: Medicare Other

## 2022-05-27 VITALS — BP 120/70 | HR 89 | Temp 98.2°F | Ht 68.5 in | Wt 287.0 lb

## 2022-05-27 DIAGNOSIS — C3411 Malignant neoplasm of upper lobe, right bronchus or lung: Secondary | ICD-10-CM

## 2022-05-27 DIAGNOSIS — G4733 Obstructive sleep apnea (adult) (pediatric): Secondary | ICD-10-CM | POA: Diagnosis not present

## 2022-05-27 DIAGNOSIS — Z86711 Personal history of pulmonary embolism: Secondary | ICD-10-CM | POA: Diagnosis not present

## 2022-05-27 DIAGNOSIS — J90 Pleural effusion, not elsewhere classified: Secondary | ICD-10-CM

## 2022-05-27 NOTE — Assessment & Plan Note (Signed)
Recurrent right pleural effusion status post multiple thoracentesis and talc pleurodesis via chest tube x2.  Most recent talc pleurodesis was May 15.  Patient says he has been doing well since his procedure.  Chest x-ray today shows decrease in right pleural effusion.  Patient appears to be clinically stable.  We will continue to follow with serial chest x-ray.  Plan  Patient Instructions  Albuterol inhaler or Duoneb At bedtime   Activity as tolerated.  Continue on CPAP At bedtime   Work on healthy weight  Do not drive if sleepy  CPAP download requested with [employer]  Follow up with Dr. Valeta Harms in 6 weeks and As needed   Please contact office for sooner follow up if symptoms do not improve or worsen or seek emergency care    m

## 2022-05-27 NOTE — Assessment & Plan Note (Signed)
Continue follow-up with oncology. Recent CT chest abdomen and pelvis showed progressive disease.  Patient is to restart chemo tomorrow.

## 2022-05-27 NOTE — Assessment & Plan Note (Signed)
History of obstructive sleep apnea.  Patient says he is unsure what his pressure settings are.  Says he wears his CPAP every single night.  We will request a CPAP download and try to get his previous sleep study results.  Continue on CPAP at bedtime  Plan  Patient Instructions  Albuterol inhaler or Duoneb At bedtime   Activity as tolerated.  Continue on CPAP At bedtime   Work on healthy weight  Do not drive if sleepy  CPAP download requested with [employer]  Follow up with Dr. Valeta Harms in 6 weeks and As needed   Please contact office for sooner follow up if symptoms do not improve or worsen or seek emergency care

## 2022-05-27 NOTE — Assessment & Plan Note (Signed)
Continue on anticoagulation therapy.  Patient education given.

## 2022-05-27 NOTE — Progress Notes (Signed)
$'@Patient'V$  ID: Adam Wise, male    DOB: 22-Aug-1951, 71 y.o.   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  Patient completed palliative radiotherapy and systemic chemotherapy.  Complicated by recurrent right-sided pleural effusion status post repeated thoracentesis.  Patient underwent a talc pleurodesis in February. Redo chemical pleurodesis via chest tube Apr 28, 2022  TEST/EVENTS :   05/27/2022 Follow up : COPD, metastatic small cell lung cancer, recurrent right-sided pleural effusion Patient returns for 1 month follow-up.  Patient was seen last visit for a recurrent right-sided pleural effusion.  Patient has underwent multiple thoracentesis.  February he had a talc pleurodesis via chest tube.  Patient had slow reaccumulation of his right-sided pleural effusion.  Patient was set up for a redo with chemical /talc pleurodesis via chest tube that occurred on Apr 28, 2022.  Patient says since then he has been doing well.  He denies any increased cough or shortness of breath. Chest x-ray today shows decreased right pleural effusion.  Starting back on Chemo tomorrow.  CT chest and abdomen and pelvis Apr 17, 2022 showed progressive disease with multiple hepatic lesions, right upper lobe pulmonary nodule stable, chronic postradiation changes of the right lung and a chronic moderate to large right pleural effusion.  Patient has a history of PE remains on Eliquis   Patient has underlying sleep apnea is on nocturnal CPAP.  Says he wears it every single night.    Allergies  Allergen Reactions   Bupropion     Racing heart   Hydrochlorothiazide Other (See Comments)     Cramping to lower extremities    Immunization History  Administered Date(s) Administered   Fluad Quad(high Dose 65+) 10/26/2020, 08/13/2021   Influenza Inj Mdck Quad Pf 09/18/2017   Influenza,inj,Quad PF,6+ Mos 10/27/2018, 11/08/2019   Influenza-Unspecified 09/18/2017   PFIZER(Purple Top)SARS-COV-2 Vaccination 03/12/2020, 04/02/2020, 12/11/2020   Pneumococcal Conjugate-13 08/17/2015   Pneumococcal Polysaccharide-23 11/25/2012, 10/27/2018   Td 08/27/2016    Past Medical History:  Diagnosis Date   Chickenpox    Chronic knee pain    Chronic low back pain    Coronary artery calcification seen on CAT scan 11/2021   Coronary CTA 11/27/2021: Moderate to large right pleural effusion and compressive atelectasis right lung base. => Coronary Calcium Score 657.  Diffuse RCA calcification.  Minimal mild disease in the LAD and diagonal branches. == Overall limited study.  Notable artifact.   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 He has disease progression after the first line of chemotherapy in December 2021.   Testosterone deficiency    Type 2 diabetes mellitus (HCC)     Tobacco History: Social History   Tobacco Use  Smoking Status Former  Smokeless Tobacco Never   Counseling given: Not Answered   Outpatient Medications Prior to Visit  Medication Sig Dispense Refill   albuterol (VENTOLIN HFA) 108 (90 Base)  MCG/ACT inhaler Inhale 2 puffs into the lungs every 6 (six) hours as needed for wheezing or shortness of breath. 18 g 0   amLODipine (NORVASC) 10 MG tablet TAKE 1 TABLET BY MOUTH EVERY DAY FOR BLOOD PRESSURE 90 tablet 3   B-D 3CC LUER-LOK SYR  22GX1" 22G X 1" 3 ML MISC USE AS INSTRUCTED FOR TESTOSTERONE INJECTION EVERY 2 WEEKS 10 each 2   bisoprolol (ZEBETA) 5 MG tablet TAKE 1 TABLET (5 MG TOTAL) BY MOUTH DAILY. 90 tablet 2   Black Pepper-Turmeric (TURMERIC COMPLEX/BLACK PEPPER PO) Take 1 tablet by mouth in the morning and at bedtime.     blood glucose meter kit and supplies KIT Dispense based on patient and insurance preference. Use up to four times daily as directed. 1 each 0   chlorpheniramine-HYDROcodone (TUSSIONEX) 10-8 MG/5ML SUER Take 5 mLs by mouth at bedtime as needed for cough. 473 mL 0   ELIQUIS 5 MG TABS tablet TAKE 1 TABLET BY MOUTH TWICE A DAY 180 tablet 2   esomeprazole (NEXIUM) 20 MG capsule Take 20 mg by mouth in the morning.     gabapentin (NEURONTIN) 300 MG capsule Take 1 capsule (300 mg total) by mouth 2 (two) times daily. For back pain. 180 capsule 1   glipiZIDE (GLUCOTROL) 10 MG tablet TAKE 1 TABLET (10 MG TOTAL) BY MOUTH 2 (TWO) TIMES DAILY BEFORE A MEAL. FOR DIABETES. 180 tablet 3   Insulin Glargine (BASAGLAR KWIKPEN) 100 UNIT/ML Inject 20 Units into the skin at bedtime. For diabetes. 30 mL 1   Insulin Pen Needle (BD PEN NEEDLE NANO U/F) 32G X 4 MM MISC Use with insulin as prescribed Dx Code: E11.9 200 each 3   ipratropium-albuterol (DUONEB) 0.5-2.5 (3) MG/3ML SOLN Take 3 mLs by nebulization every 4 (four) hours while awake for 3 days, THEN 3 mLs every 4 (four) hours as needed (shortness of breath or wheezing). 360 mL 0   Lancets MISC USE UP TO 3 TIMES DAILY AS DIRECTED 100 each 2   losartan (COZAAR) 50 MG tablet Take 1 tablet (50 mg total) by mouth daily. For blood pressure. 90 tablet 3   Melatonin 10 MG CAPS Take 10 mg by mouth at bedtime.     metFORMIN (GLUCOPHAGE) 1000 MG tablet TAKE 1 TABLET (1,000 MG TOTAL) BY MOUTH 2 (TWO) TIMES DAILY WITH A MEAL. FOR DIABETES 180 tablet 1   Multiple Vitamin (MULTI-VITAMINS) TABS Take 1 tablet by mouth in the morning.     Omega-3 Fatty Acids (SALMON OIL PO) Take 1 capsule by  mouth daily.     ONETOUCH ULTRA test strip USE UP TO 4 TIMES DAILY AS DIRECTED 100 strip 5   oxymetazoline (AFRIN) 0.05 % nasal spray Place 1 spray into both nostrils daily.     pioglitazone (ACTOS) 45 MG tablet TAKE 1 TABLET (45 MG TOTAL) BY MOUTH DAILY. FOR DIABETES. 90 tablet 1   pravastatin (PRAVACHOL) 40 MG tablet TAKE 1 TABLET BY MOUTH EVERY DAY IN THE EVENING FOR CHOLESTEROL 90 tablet 1   testosterone cypionate (DEPOTESTOSTERONE CYPIONATE) 200 MG/ML injection Inject 200 mg into the muscle every 14 (fourteen) days.     doxycycline (VIBRA-TABS) 100 MG tablet Take 1 tablet (100 mg total) by mouth 2 (two) times daily. (Patient not taking: Reported on 05/27/2022) 14 tablet 0   predniSONE (DELTASONE) 20 MG tablet Take 2 tablets by mouth once daily for 5 days. (Patient not taking: Reported on 05/27/2022) 10 tablet 0   No facility-administered medications prior  to visit.     Review of Systems:   Constitutional:   No  weight loss, night sweats,  Fevers, chills, fatigue, or  lassitude.  HEENT:   No headaches,  Difficulty swallowing,  Tooth/dental problems, or  Sore throat,                No sneezing, itching, ear ache, nasal congestion, post nasal drip,   CV:  No chest pain,  Orthopnea, PND, swelling in lower extremities, anasarca, dizziness, palpitations, syncope.   GI  No heartburn, indigestion, abdominal pain, nausea, vomiting, diarrhea, change in bowel habits, loss of appetite, bloody stools.   Resp: No shortness of breath with exertion or at rest.  No excess mucus, no productive cough,  No non-productive cough,  No coughing up of blood.  No change in color of mucus.  No wheezing.  No chest wall deformity  Skin: no rash or lesions.  GU: no dysuria, change in color of urine, no urgency or frequency.  No flank pain, no hematuria   MS:  No joint pain or swelling.  No decreased range of motion.  No back pain.    Physical Exam  BP 120/70 (BP Location: Left Arm, Patient Position:  Sitting, Cuff Size: Large)   Pulse 89   Temp 98.2 F (36.8 C) (Oral)   Ht 5' 8.5" (1.74 m)   Wt 287 lb (130.2 kg)   SpO2 95%   BMI 43.00 kg/m   GEN: A/Ox3; pleasant , NAD, well nourished    HEENT:  [place]/AT,  EACs-clear, TMs-wnl, NOSE-clear, THROAT-clear, no lesions, no postnasal drip or exudate noted.   NECK:  Supple w/ fair ROM; no JVD; normal carotid impulses w/o bruits; no thyromegaly or nodules palpated; no lymphadenopathy.    RESP  Clear  P & A; w/o, wheezes/ rales/ or rhonchi. no accessory muscle use, no dullness to percussion  CARD:  RRR, no m/r/g, no peripheral edema, pulses intact, no cyanosis or clubbing.  GI:   Soft & nt; nml bowel sounds; no organomegaly or masses detected.   Musco: Warm bil, no deformities or joint swelling noted.   Neuro: alert, no focal deficits noted.    Skin: Warm, no lesions or rashes    Lab Results:    BNP    Imaging: DG Chest 2 View  Result Date: 05/27/2022 CLINICAL DATA:  Pleural effusion EXAM: CHEST - 2 VIEW COMPARISON:  05/15/2022 FINDINGS: Right chest wall port catheter is unchanged. Stable lung aeration with right perihilar fibrotic changes. Decreased right pleural effusion. Stable cardiomediastinal contours. No acute osseous abnormality. IMPRESSION: Decreased right pleural effusion. Electronically Signed   By: Macy Mis M.D.   On: 05/27/2022 12:14   DG Chest 2 View  Result Date: 05/16/2022 CLINICAL DATA:  71 year old male with history of cough and shortness of breath for 1 week. History of stage IV lung cancer. EXAM: CHEST - 2 VIEW COMPARISON:  Chest x-ray 04/28/2022. FINDINGS: Right internal jugular single-lumen porta cath with tip terminating at the superior cavoatrial junction. Mass-like area of architectural distortion in the perihilar aspect of the right mid to upper lung, similar to prior examinations, corresponding with areas of chronic postradiation mass-like fibrosis. Moderate right pleural effusion and elevation of  the right hemidiaphragm is also similar to prior studies. Left lung is clear. No left pleural effusion. No pneumothorax. No evidence of pulmonary edema. Heart size is normal. IMPRESSION: 1. No radiographic evidence of acute cardiopulmonary disease. The appearance the chest is similar to prior studies, as  above. Electronically Signed   By: Vinnie Langton M.D.   On: 05/16/2022 12:12   DG CHEST PORT 1 VIEW  Result Date: 04/28/2022 CLINICAL DATA:  Status post right thoracentesis EXAM: PORTABLE CHEST 1 VIEW COMPARISON:  01/16/2022, 04/17/2022 FINDINGS: Two frontal views of the chest demonstrate a stable right chest wall port. Pigtail drainage catheter is seen overlying the medial right costophrenic angle, with marked decrease in right pleural effusion since prior exam. Stable post therapeutic changes in the right suprahilar region. No airspace disease or pneumothorax. No acute bony abnormalities. IMPRESSION: 1. Resolution of right pleural effusion after right pigtail drainage catheter placement. 2. Stable post therapeutic changes right suprahilar region. Electronically Signed   By: Randa Ngo M.D.   On: 04/28/2022 14:59    atropine injection 0.5 mg     Date Action Dose Route User   Discharged on 04/28/2022   Admitted on 04/28/2022   04/01/2022 1128 Given 0.5 mg Intravenous Severiano Gilbert, RN      atropine injection 0.5 mg     Date Action Dose Route User   Discharged on 04/28/2022   Admitted on 04/28/2022   04/11/2022 1300 Given 0.5 mg Intravenous Dionne Ano, RN      dexamethasone (DECADRON) 10 mg in sodium chloride 0.9 % 50 mL IVPB     Date Action Dose Route User   Discharged on 04/28/2022   Admitted on 04/28/2022   04/01/2022 1100 Rate/Dose Change (none) Intravenous Severiano Gilbert, RN   04/01/2022 1100 Rate/Dose Change (none) Intravenous Severiano Gilbert, RN   04/01/2022 1059 Rate/Dose Change (none) Intravenous Severiano Gilbert, RN   04/01/2022 1058 Restarted (none) Intravenous Severiano Gilbert, RN    04/01/2022 1040 Rate/Dose Change (none) Intravenous Severiano Gilbert, RN      dexamethasone (DECADRON) 10 mg in sodium chloride 0.9 % 50 mL IVPB     Date Action Dose Route User   Discharged on 04/28/2022   Admitted on 04/28/2022   04/11/2022 1207 Rate/Dose Change (none) Intravenous Dionne Ano, RN   04/11/2022 1207 New Bag/Given 10 mg Intravenous Georgette Shell T, RN      heparin lock flush 100 unit/mL     Date Action Dose Route User   Discharged on 04/28/2022   Admitted on 04/28/2022   04/01/2022 1311 Given 500 Units Intracatheter Severiano Gilbert, RN      heparin lock flush 100 unit/mL     Date Action Dose Route User   Discharged on 04/28/2022   Admitted on 04/28/2022   04/11/2022 1437 Given 500 Units Intracatheter Dionne Ano, RN      heparin lock flush 100 unit/mL     Date Action Dose Route User   Discharged on 04/28/2022   Admitted on 04/28/2022   04/22/2022 1148 Given 500 Units Intracatheter Anastasia Pall, RN      irinotecan (CAMPTOSAR) 160 mg in sodium chloride 0.9 % 500 mL chemo infusion     Date Action Dose Route User   Discharged on 04/28/2022   Admitted on 04/28/2022   04/01/2022 1132 Infusion Verify (none) Intravenous Severiano Gilbert, RN   04/01/2022 1132 Rate/Dose Change (none) Intravenous Severiano Gilbert, RN   04/01/2022 1132 New Bag/Given 160 mg Intravenous Severiano Gilbert, RN      irinotecan (CAMPTOSAR) 160 mg in sodium chloride 0.9 % 500 mL chemo infusion     Date Action Dose Route User   Discharged on 04/28/2022   Admitted on 04/28/2022  04/11/2022 1434 Rate/Dose Change (none) Intravenous Dionne Ano, RN   04/11/2022 1434 Rate/Dose Change (none) Intravenous Dionne Ano, RN   04/11/2022 1304 Rate/Dose Change (none) Intravenous Dionne Ano, RN   04/11/2022 1303 New Bag/Given 160 mg Intravenous Dionne Ano, RN      palonosetron (ALOXI) injection 0.25 mg     Date Action Dose Route User   Discharged on 04/28/2022   Admitted on 04/28/2022   04/01/2022  1037 Given 0.25 mg Intravenous Severiano Gilbert, RN      palonosetron (ALOXI) injection 0.25 mg     Date Action Dose Route User   Discharged on 04/28/2022   Admitted on 04/28/2022   04/11/2022 1204 Given 0.25 mg Intravenous Georgette Shell T, RN      0.9 %  sodium chloride infusion     Date Action Dose Route User   Discharged on 04/28/2022   Admitted on 04/28/2022   04/01/2022 1310 Rate/Dose Change (none) Intravenous Severiano Gilbert, RN   04/01/2022 1304 Rate/Dose Change (none) Intravenous Severiano Gilbert, RN   04/01/2022 1132 Rate/Dose Change (none) Intravenous Severiano Gilbert, RN   04/01/2022 1130 Rate/Dose Change (none) Intravenous Severiano Gilbert, RN   04/01/2022 1105 Restarted (none) Intravenous Severiano Gilbert, RN      0.9 %  sodium chloride infusion     Date Action Dose Route User   Discharged on 04/28/2022   Admitted on 04/28/2022   04/11/2022 1442 Rate/Dose Change (none) Intravenous Dionne Ano, RN   04/11/2022 1437 Rate/Dose Change (none) Intravenous Dionne Ano, RN   04/11/2022 1258 Infusion Verify (none) Intravenous Dionne Ano, RN   04/11/2022 1230 Infusion Verify (none) Intravenous Dionne Ano, RN   04/11/2022 1230 Rate/Dose Change (none) Intravenous Dionne Ano, RN      sodium chloride flush (NS) 0.9 % injection 10 mL     Date Action Dose Route User   Discharged on 04/28/2022   Admitted on 04/28/2022   04/01/2022 6659 Given 10 mL Intracatheter Tama Headings B, LPN      sodium chloride flush (NS) 0.9 % injection 10 mL     Date Action Dose Route User   Discharged on 04/28/2022   Admitted on 04/28/2022   04/01/2022 1311 Given 10 mL Intracatheter Severiano Gilbert, RN      sodium chloride flush (NS) 0.9 % injection 10 mL     Date Action Dose Route User   Discharged on 04/28/2022   Admitted on 04/28/2022   04/11/2022 1113 Given 10 mL Intracatheter Deel, Verlin Dike, RN      sodium chloride flush (NS) 0.9 % injection 10 mL     Date Action Dose Route User    Discharged on 04/28/2022   Admitted on 04/28/2022   04/11/2022 1437 Given 10 mL Intracatheter Dionne Ano, RN      sodium chloride flush (NS) 0.9 % injection 10 mL     Date Action Dose Route User   Discharged on 04/28/2022   Admitted on 04/28/2022   04/22/2022 1039 Given 10 mL Intracatheter Tama Headings B, LPN      sodium chloride flush (NS) 0.9 % injection 10 mL     Date Action Dose Route User   Discharged on 04/28/2022   Admitted on 04/28/2022   04/22/2022 1148 Given 10 mL Intracatheter Anastasia Pall, RN           No data to display  No results found for: "NITRICOXIDE"      Assessment & Plan:   Recurrent right pleural effusion Recurrent right pleural effusion status post multiple thoracentesis and talc pleurodesis via chest tube x2.  Most recent talc pleurodesis was May 15.  Patient says he has been doing well since his procedure.  Chest x-ray today shows decrease in right pleural effusion.  Patient appears to be clinically stable.  We will continue to follow with serial chest x-ray.  Plan  Patient Instructions  Albuterol inhaler or Duoneb At bedtime   Activity as tolerated.  Continue on CPAP At bedtime   Work on healthy weight  Do not drive if sleepy  CPAP download requested with [employer]  Follow up with Dr. Valeta Harms in 6 weeks and As needed   Please contact office for sooner follow up if symptoms do not improve or worsen or seek emergency care    m   OSA (obstructive sleep apnea) History of obstructive sleep apnea.  Patient says he is unsure what his pressure settings are.  Says he wears his CPAP every single night.  We will request a CPAP download and try to get his previous sleep study results.  Continue on CPAP at bedtime  Plan  Patient Instructions  Albuterol inhaler or Duoneb At bedtime   Activity as tolerated.  Continue on CPAP At bedtime   Work on healthy weight  Do not drive if sleepy  CPAP download requested with [employer]  Follow up  with Dr. Valeta Harms in 6 weeks and As needed   Please contact office for sooner follow up if symptoms do not improve or worsen or seek emergency care       Small cell lung cancer, right upper lobe [facility]) Continue follow-up with oncology. Recent CT chest abdomen and pelvis showed progressive disease.  Patient is to restart chemo tomorrow.  History of pulmonary embolism Continue on anticoagulation therapy.  Patient education given.     Rexene Edison, NP 05/27/2022

## 2022-05-27 NOTE — Patient Instructions (Signed)
Albuterol inhaler or Duoneb At bedtime   Activity as tolerated.  Continue on CPAP At bedtime   Work on healthy weight  Do not drive if sleepy  CPAP download requested with [employer]  Follow up with Dr. Valeta Harms in 6 weeks and As needed   Please contact office for sooner follow up if symptoms do not improve or worsen or seek emergency care

## 2022-05-28 ENCOUNTER — Other Ambulatory Visit: Payer: Self-pay

## 2022-05-28 ENCOUNTER — Inpatient Hospital Stay: Payer: Medicare Other | Attending: Internal Medicine

## 2022-05-28 ENCOUNTER — Inpatient Hospital Stay (HOSPITAL_BASED_OUTPATIENT_CLINIC_OR_DEPARTMENT_OTHER): Payer: Medicare Other | Admitting: Physician Assistant

## 2022-05-28 ENCOUNTER — Inpatient Hospital Stay: Payer: Medicare Other

## 2022-05-28 ENCOUNTER — Encounter: Payer: Self-pay | Admitting: Physician Assistant

## 2022-05-28 ENCOUNTER — Telehealth: Payer: Self-pay | Admitting: Adult Health

## 2022-05-28 VITALS — BP 127/73 | HR 87 | Temp 97.5°F | Resp 18 | Ht 68.5 in | Wt 285.6 lb

## 2022-05-28 DIAGNOSIS — C3411 Malignant neoplasm of upper lobe, right bronchus or lung: Secondary | ICD-10-CM | POA: Insufficient documentation

## 2022-05-28 DIAGNOSIS — Z5111 Encounter for antineoplastic chemotherapy: Secondary | ICD-10-CM | POA: Diagnosis not present

## 2022-05-28 DIAGNOSIS — Z95828 Presence of other vascular implants and grafts: Secondary | ICD-10-CM

## 2022-05-28 DIAGNOSIS — C787 Secondary malignant neoplasm of liver and intrahepatic bile duct: Secondary | ICD-10-CM | POA: Insufficient documentation

## 2022-05-28 LAB — CBC WITH DIFFERENTIAL (CANCER CENTER ONLY)
Abs Immature Granulocytes: 0.04 10*3/uL (ref 0.00–0.07)
Basophils Absolute: 0.1 10*3/uL (ref 0.0–0.1)
Basophils Relative: 1 %
Eosinophils Absolute: 0.3 10*3/uL (ref 0.0–0.5)
Eosinophils Relative: 4 %
HCT: 36.8 % — ABNORMAL LOW (ref 39.0–52.0)
Hemoglobin: 12 g/dL — ABNORMAL LOW (ref 13.0–17.0)
Immature Granulocytes: 1 %
Lymphocytes Relative: 8 %
Lymphs Abs: 0.7 10*3/uL (ref 0.7–4.0)
MCH: 29.3 pg (ref 26.0–34.0)
MCHC: 32.6 g/dL (ref 30.0–36.0)
MCV: 89.8 fL (ref 80.0–100.0)
Monocytes Absolute: 0.7 10*3/uL (ref 0.1–1.0)
Monocytes Relative: 8 %
Neutro Abs: 6.5 10*3/uL (ref 1.7–7.7)
Neutrophils Relative %: 78 %
Platelet Count: 194 10*3/uL (ref 150–400)
RBC: 4.1 MIL/uL — ABNORMAL LOW (ref 4.22–5.81)
RDW: 14.4 % (ref 11.5–15.5)
WBC Count: 8.2 10*3/uL (ref 4.0–10.5)
nRBC: 0 % (ref 0.0–0.2)

## 2022-05-28 LAB — CMP (CANCER CENTER ONLY)
ALT: 23 U/L (ref 0–44)
AST: 21 U/L (ref 15–41)
Albumin: 3.7 g/dL (ref 3.5–5.0)
Alkaline Phosphatase: 72 U/L (ref 38–126)
Anion gap: 8 (ref 5–15)
BUN: 16 mg/dL (ref 8–23)
CO2: 27 mmol/L (ref 22–32)
Calcium: 9.4 mg/dL (ref 8.9–10.3)
Chloride: 103 mmol/L (ref 98–111)
Creatinine: 1.21 mg/dL (ref 0.61–1.24)
GFR, Estimated: >60 mL/min (ref >60)
Glucose, Bld: 193 mg/dL — ABNORMAL HIGH (ref 70–99)
Potassium: 4.4 mmol/L (ref 3.5–5.1)
Sodium: 138 mmol/L (ref 135–145)
Total Bilirubin: 0.3 mg/dL (ref 0.3–1.2)
Total Protein: 6.4 g/dL — ABNORMAL LOW (ref 6.5–8.1)

## 2022-05-28 MED ORDER — SODIUM CHLORIDE 0.9 % IV SOLN
10.0000 mg | Freq: Once | INTRAVENOUS | Status: AC
  Administered 2022-05-28: 10 mg via INTRAVENOUS
  Filled 2022-05-28: qty 10

## 2022-05-28 MED ORDER — SODIUM CHLORIDE 0.9% FLUSH
10.0000 mL | Freq: Once | INTRAVENOUS | Status: AC
  Administered 2022-05-28: 10 mL

## 2022-05-28 MED ORDER — TRILACICLIB DIHYDROCHLORIDE INJECTION 300 MG
240.0000 mg/m2 | Freq: Once | INTRAVENOUS | Status: AC
  Administered 2022-05-28: 600 mg via INTRAVENOUS
  Filled 2022-05-28: qty 40

## 2022-05-28 MED ORDER — SODIUM CHLORIDE 0.9 % IV SOLN: Freq: Once | INTRAVENOUS | Status: AC

## 2022-05-28 MED ORDER — SODIUM CHLORIDE 0.9 % IV SOLN
682.5000 mg | Freq: Once | INTRAVENOUS | Status: AC
  Administered 2022-05-28: 680 mg via INTRAVENOUS
  Filled 2022-05-28: qty 68

## 2022-05-28 MED ORDER — FAMOTIDINE IN NACL 20-0.9 MG/50ML-% IV SOLN
20.0000 mg | Freq: Once | INTRAVENOUS | Status: AC
  Administered 2022-05-28: 20 mg via INTRAVENOUS
  Filled 2022-05-28: qty 50

## 2022-05-28 MED ORDER — HEPARIN SOD (PORK) LOCK FLUSH 100 UNIT/ML IV SOLN
500.0000 [IU] | Freq: Once | INTRAVENOUS | Status: AC | PRN
  Administered 2022-05-28: 500 [IU]

## 2022-05-28 MED ORDER — DIPHENHYDRAMINE HCL 25 MG PO CAPS
25.0000 mg | ORAL_CAPSULE | Freq: Once | ORAL | Status: AC
  Administered 2022-05-28: 25 mg via ORAL
  Filled 2022-05-28: qty 1

## 2022-05-28 MED ORDER — SODIUM CHLORIDE 0.9% FLUSH
10.0000 mL | INTRAVENOUS | Status: DC | PRN
  Administered 2022-05-28: 10 mL

## 2022-05-28 MED ORDER — SODIUM CHLORIDE 0.9 % IV SOLN
100.0000 mg/m2 | Freq: Once | INTRAVENOUS | Status: AC
  Administered 2022-05-28: 260 mg via INTRAVENOUS
  Filled 2022-05-28: qty 13

## 2022-05-28 MED ORDER — SODIUM CHLORIDE 0.9 % IV SOLN
150.0000 mg | Freq: Once | INTRAVENOUS | Status: AC
  Administered 2022-05-28: 150 mg via INTRAVENOUS
  Filled 2022-05-28: qty 150

## 2022-05-28 MED ORDER — PALONOSETRON HCL INJECTION 0.25 MG/5ML
0.2500 mg | Freq: Once | INTRAVENOUS | Status: AC
  Administered 2022-05-28: 0.25 mg via INTRAVENOUS
  Filled 2022-05-28: qty 5

## 2022-05-28 NOTE — Patient Instructions (Signed)
[facility]  Discharge Instructions: Thank you for choosing [facility] to provide your oncology and hematology care.   If you have a lab appointment with the [facility], please go directly to the [facility] and check in at the registration area.   Wear comfortable clothing and clothing appropriate for easy access to any Portacath or PICC line.   We strive to give you quality time with your provider. You may need to reschedule your appointment if you arrive late (15 or more minutes).  Arriving late affects you and other patients whose appointments are after yours.  Also, if you miss three or more appointments without notifying the office, you may be dismissed from the clinic at the provider's discretion.      For prescription refill requests, have your pharmacy contact our office and allow 72 hours for refills to be completed.    Today you received the following chemotherapy and/or immunotherapy agent: Carboplatin and Etoposide.     To help prevent nausea and vomiting after your treatment, we encourage you to take your nausea medication as directed.  BELOW ARE SYMPTOMS THAT SHOULD BE REPORTED IMMEDIATELY: *FEVER GREATER THAN 100.4 F (38 C) OR HIGHER *CHILLS OR SWEATING *NAUSEA AND VOMITING THAT IS NOT CONTROLLED WITH YOUR NAUSEA MEDICATION *UNUSUAL SHORTNESS OF BREATH *UNUSUAL BRUISING OR BLEEDING *URINARY PROBLEMS (pain or burning when urinating, or frequent urination) *BOWEL PROBLEMS (unusual diarrhea, constipation, pain near the anus) TENDERNESS IN MOUTH AND THROAT WITH OR WITHOUT PRESENCE OF ULCERS (sore throat, sores in mouth, or a toothache) UNUSUAL RASH, SWELLING OR PAIN  UNUSUAL VAGINAL DISCHARGE OR ITCHING   Items with * indicate a potential emergency and should be followed up as soon as possible or go to the Emergency Department if any problems should occur.  Please show the CHEMOTHERAPY ALERT CARD or IMMUNOTHERAPY ALERT CARD  at check-in to the Emergency Department and triage nurse.  Should you have questions after your visit or need to cancel or reschedule your appointment, please contact [facility]  Dept: [phone]  and follow the prompts.  Office hours are 8:00 a.m. to 4:30 p.m. Monday - Friday. Please note that voicemails left after 4:00 p.m. may not be returned until the following business day.  We are closed weekends and major holidays. You have access to a nurse at all times for urgent questions. Please call the main number to the clinic Dept: [phone] and follow the prompts.   For any non-urgent questions, you may also contact your provider using MyChart. We now offer e-Visits for anyone 43 and older to request care online for non-urgent symptoms. For details visit [email or web address].   Also download the MyChart app! Go to the app store, search "MyChart", open the app, select [facility], and log in with your MyChart username and password.  Due to Covid, a mask is required upon entering the hospital/clinic. If you do not have a mask, one will be given to you upon arrival. For doctor visits, patients may have 1 support person aged 84 or older with them. For treatment visits, patients cannot have anyone with them due to current Covid guidelines and our immunocompromised population.

## 2022-05-28 NOTE — Progress Notes (Signed)
Patient with > 6 doses of carboplatin so will need additional premedication of diphenhydramine and famotidine added to the plan.  Diphenhydramine 25 mg oral x 1 and Famotidine 20 mg IVPB x 1 added to today and all future plans with carboplatin.  Per [facility] guidelines.  Henreitta Leber, PharmD

## 2022-05-28 NOTE — Telephone Encounter (Signed)
CPAP download over the last 30 days shows excellent control and compliance.  Daily average usage at 11 hours.  Patient is on CPAP AutoSet 8 to 20 cm H2O.  Daily average pressure at 13.6 cm H2O.  AHI 0.6.  This shows that you are doing excellent on your CPAP.  Would not change any pressures.  We will discuss in detail at follow-up visit

## 2022-05-29 ENCOUNTER — Inpatient Hospital Stay: Payer: Medicare Other

## 2022-05-29 ENCOUNTER — Inpatient Hospital Stay: Payer: Medicare Other | Admitting: Internal Medicine

## 2022-05-29 VITALS — BP 140/74 | HR 93 | Temp 97.5°F | Resp 18

## 2022-05-29 DIAGNOSIS — C3411 Malignant neoplasm of upper lobe, right bronchus or lung: Secondary | ICD-10-CM | POA: Diagnosis not present

## 2022-05-29 DIAGNOSIS — C787 Secondary malignant neoplasm of liver and intrahepatic bile duct: Secondary | ICD-10-CM | POA: Diagnosis not present

## 2022-05-29 DIAGNOSIS — Z5111 Encounter for antineoplastic chemotherapy: Secondary | ICD-10-CM | POA: Diagnosis not present

## 2022-05-29 MED ORDER — HEPARIN SOD (PORK) LOCK FLUSH 100 UNIT/ML IV SOLN
500.0000 [IU] | Freq: Once | INTRAVENOUS | Status: AC | PRN
  Administered 2022-05-29: 500 [IU]

## 2022-05-29 MED ORDER — TRILACICLIB DIHYDROCHLORIDE INJECTION 300 MG
240.0000 mg/m2 | Freq: Once | INTRAVENOUS | Status: AC
  Administered 2022-05-29: 600 mg via INTRAVENOUS
  Filled 2022-05-29: qty 40

## 2022-05-29 MED ORDER — SODIUM CHLORIDE 0.9 % IV SOLN
10.0000 mg | Freq: Once | INTRAVENOUS | Status: AC
  Administered 2022-05-29: 10 mg via INTRAVENOUS
  Filled 2022-05-29: qty 10

## 2022-05-29 MED ORDER — SODIUM CHLORIDE 0.9 % IV SOLN: Freq: Once | INTRAVENOUS | Status: AC

## 2022-05-29 MED ORDER — SODIUM CHLORIDE 0.9 % IV SOLN
100.0000 mg/m2 | Freq: Once | INTRAVENOUS | Status: AC
  Administered 2022-05-29: 260 mg via INTRAVENOUS
  Filled 2022-05-29: qty 13

## 2022-05-29 MED FILL — Dexamethasone Sodium Phosphate Inj 100 MG/10ML: INTRAMUSCULAR | Qty: 1 | Status: AC

## 2022-05-29 NOTE — Patient Instructions (Signed)
[facility]  Discharge Instructions: Thank you for choosing [facility] to provide your oncology and hematology care.   If you have a lab appointment with the [facility], please go directly to the [facility] and check in at the registration area.   Wear comfortable clothing and clothing appropriate for easy access to any Portacath or PICC line.   We strive to give you quality time with your provider. You may need to reschedule your appointment if you arrive late (15 or more minutes).  Arriving late affects you and other patients whose appointments are after yours.  Also, if you miss three or more appointments without notifying the office, you may be dismissed from the clinic at the provider's discretion.      For prescription refill requests, have your pharmacy contact our office and allow 72 hours for refills to be completed.    Today you received the following chemotherapy and/or immunotherapy agents: Etoposide      To help prevent nausea and vomiting after your treatment, we encourage you to take your nausea medication as directed.  BELOW ARE SYMPTOMS THAT SHOULD BE REPORTED IMMEDIATELY: *FEVER GREATER THAN 100.4 F (38 C) OR HIGHER *CHILLS OR SWEATING *NAUSEA AND VOMITING THAT IS NOT CONTROLLED WITH YOUR NAUSEA MEDICATION *UNUSUAL SHORTNESS OF BREATH *UNUSUAL BRUISING OR BLEEDING *URINARY PROBLEMS (pain or burning when urinating, or frequent urination) *BOWEL PROBLEMS (unusual diarrhea, constipation, pain near the anus) TENDERNESS IN MOUTH AND THROAT WITH OR WITHOUT PRESENCE OF ULCERS (sore throat, sores in mouth, or a toothache) UNUSUAL RASH, SWELLING OR PAIN  UNUSUAL VAGINAL DISCHARGE OR ITCHING   Items with * indicate a potential emergency and should be followed up as soon as possible or go to the Emergency Department if any problems should occur.  Please show the CHEMOTHERAPY ALERT CARD or IMMUNOTHERAPY ALERT CARD at check-in to  the Emergency Department and triage nurse.  Should you have questions after your visit or need to cancel or reschedule your appointment, please contact [facility]  Dept: [phone]  and follow the prompts.  Office hours are 8:00 a.m. to 4:30 p.m. Monday - Friday. Please note that voicemails left after 4:00 p.m. may not be returned until the following business day.  We are closed weekends and major holidays. You have access to a nurse at all times for urgent questions. Please call the main number to the clinic Dept: [phone] and follow the prompts.   For any non-urgent questions, you may also contact your provider using MyChart. We now offer e-Visits for anyone 49 and older to request care online for non-urgent symptoms. For details visit [email or web address].   Also download the MyChart app! Go to the app store, search "MyChart", open the app, select [facility], and log in with your MyChart username and password.  Masks are optional in the cancer centers. If you would like for your care team to wear a mask while they are taking care of you, please let them know. For doctor visits, patients may have with them one support person who is at least 72 years old. At this time, visitors are not allowed in the infusion area.

## 2022-05-30 ENCOUNTER — Other Ambulatory Visit: Payer: Self-pay

## 2022-05-30 ENCOUNTER — Inpatient Hospital Stay: Payer: Medicare Other

## 2022-05-30 VITALS — BP 152/84 | HR 87 | Temp 97.5°F | Resp 16

## 2022-05-30 DIAGNOSIS — C787 Secondary malignant neoplasm of liver and intrahepatic bile duct: Secondary | ICD-10-CM | POA: Diagnosis not present

## 2022-05-30 DIAGNOSIS — C3411 Malignant neoplasm of upper lobe, right bronchus or lung: Secondary | ICD-10-CM

## 2022-05-30 DIAGNOSIS — Z5111 Encounter for antineoplastic chemotherapy: Secondary | ICD-10-CM | POA: Diagnosis not present

## 2022-05-30 MED ORDER — SODIUM CHLORIDE 0.9 % IV SOLN: Freq: Once | INTRAVENOUS | Status: AC

## 2022-05-30 MED ORDER — TRILACICLIB DIHYDROCHLORIDE INJECTION 300 MG
240.0000 mg/m2 | Freq: Once | INTRAVENOUS | Status: AC
  Administered 2022-05-30: 600 mg via INTRAVENOUS
  Filled 2022-05-30: qty 40

## 2022-05-30 MED ORDER — SODIUM CHLORIDE 0.9 % IV SOLN
100.0000 mg/m2 | Freq: Once | INTRAVENOUS | Status: AC
  Administered 2022-05-30: 260 mg via INTRAVENOUS
  Filled 2022-05-30: qty 13

## 2022-05-30 MED ORDER — SODIUM CHLORIDE 0.9 % IV SOLN
10.0000 mg | Freq: Once | INTRAVENOUS | Status: AC
  Administered 2022-05-30: 10 mg via INTRAVENOUS
  Filled 2022-05-30: qty 10

## 2022-05-30 MED ORDER — SODIUM CHLORIDE 0.9% FLUSH
10.0000 mL | INTRAVENOUS | Status: DC | PRN
  Administered 2022-05-30: 10 mL

## 2022-05-30 MED ORDER — HEPARIN SOD (PORK) LOCK FLUSH 100 UNIT/ML IV SOLN
500.0000 [IU] | Freq: Once | INTRAVENOUS | Status: AC | PRN
  Administered 2022-05-30: 500 [IU]

## 2022-05-30 NOTE — Patient Instructions (Signed)
[facility]   Discharge Instructions: Thank you for choosing [facility] to provide your oncology and hematology care.   If you have a lab appointment with the [facility], please go directly to the [facility] and check in at the registration area.   Wear comfortable clothing and clothing appropriate for easy access to any Portacath or PICC line.   We strive to give you quality time with your provider. You may need to reschedule your appointment if you arrive late (15 or more minutes).  Arriving late affects you and other patients whose appointments are after yours.  Also, if you miss three or more appointments without notifying the office, you may be dismissed from the clinic at the provider's discretion.      For prescription refill requests, have your pharmacy contact our office and allow 72 hours for refills to be completed.    Today you received the following chemotherapy and/or immunotherapy agents: etoposide      To help prevent nausea and vomiting after your treatment, we encourage you to take your nausea medication as directed.  BELOW ARE SYMPTOMS THAT SHOULD BE REPORTED IMMEDIATELY: *FEVER GREATER THAN 100.4 F (38 C) OR HIGHER *CHILLS OR SWEATING *NAUSEA AND VOMITING THAT IS NOT CONTROLLED WITH YOUR NAUSEA MEDICATION *UNUSUAL SHORTNESS OF BREATH *UNUSUAL BRUISING OR BLEEDING *URINARY PROBLEMS (pain or burning when urinating, or frequent urination) *BOWEL PROBLEMS (unusual diarrhea, constipation, pain near the anus) TENDERNESS IN MOUTH AND THROAT WITH OR WITHOUT PRESENCE OF ULCERS (sore throat, sores in mouth, or a toothache) UNUSUAL RASH, SWELLING OR PAIN  UNUSUAL VAGINAL DISCHARGE OR ITCHING   Items with * indicate a potential emergency and should be followed up as soon as possible or go to the Emergency Department if any problems should occur.  Please show the CHEMOTHERAPY ALERT CARD or IMMUNOTHERAPY ALERT CARD at check-in  to the Emergency Department and triage nurse.  Should you have questions after your visit or need to cancel or reschedule your appointment, please contact [facility]  Dept: [phone]  and follow the prompts.  Office hours are 8:00 a.m. to 4:30 p.m. Monday - Friday. Please note that voicemails left after 4:00 p.m. may not be returned until the following business day.  We are closed weekends and major holidays. You have access to a nurse at all times for urgent questions. Please call the main number to the clinic Dept: [phone] and follow the prompts.   For any non-urgent questions, you may also contact your provider using MyChart. We now offer e-Visits for anyone 97 and older to request care online for non-urgent symptoms. For details visit [email or web address].   Also download the MyChart app! Go to the app store, search "MyChart", open the app, select , and log in with your MyChart username and password.  Masks are optional in the cancer centers. If you would like for your care team to wear a mask while they are taking care of you, please let them know. For doctor visits, patients may have with them one support person who is at least 72 years old. At this time, visitors are not allowed in the infusion area.

## 2022-05-30 NOTE — Telephone Encounter (Signed)
Called and spoke with patient's wife, [name] ([place]), information per Rexene Edison NP.  She stated she was advised to stop using the cleaning device they had for his CPAP machine and was wondering what to use to clean it with now.  Advised to use warm soapy water, rinse and let air dry.  She verbalized understanding.  Nothing further needed.

## 2022-06-03 ENCOUNTER — Ambulatory Visit: Payer: Medicare Other

## 2022-06-03 ENCOUNTER — Other Ambulatory Visit: Payer: Medicare Other

## 2022-06-03 ENCOUNTER — Ambulatory Visit: Payer: Medicare Other | Admitting: Physician Assistant

## 2022-06-11 ENCOUNTER — Other Ambulatory Visit: Payer: Medicare Other

## 2022-06-11 ENCOUNTER — Ambulatory Visit: Payer: Medicare Other | Admitting: Internal Medicine

## 2022-06-11 ENCOUNTER — Ambulatory Visit: Payer: Medicare Other

## 2022-06-12 ENCOUNTER — Ambulatory Visit: Payer: Medicare Other

## 2022-06-12 NOTE — Progress Notes (Signed)
[facility]EMAT[facility]COLOGY TeleHEALTH VISIT PROGRESS NOTE   I connected with Adam Wise on 06/16/22 at  3:00 PM EDT by telephone and verified that I am speaking with the correct person using two identifiers.  I discussed the limitations, risks, security and privacy concerns of performing an evaluation and management service by telemedicine and the availability of in-person appointments. I also discussed with the patient that there may be a patient responsible charge related to this service. The patient expressed understanding and agreed to proceed.  Other persons participating in the visit and their role in the encounter: None  Patient's location: Home  Provider's location: Office  [name], NP [place] [id number]  DIAGNOSIS: Relapsed extensive stage (T4, N3, M1c)  small cell lung cancer diagnosed in April 2021 and presented with large right upper lobe/right hilar mass with ipsilateral and contralateral mediastinal and right supraclavicular lymphadenopathy in addition to multiple liver lesions. He has disease progression after the first line of chemotherapy in December 2021.  PRIOR THERAPY: 1) Palliative radiotherapy to the right upper lobe lung mass under the care of Dr. Lisbeth Renshaw. 2) Systemic chemotherapy with carboplatin for AUC of 5 on day 1, etoposide 100 mg/M2 on days 1, 2 and 3 in addition to Imfinzi 1500 mg IV every 3 weeks with chemotherapy then every 4 weeks for maintenance if the patient has no evidence for progression.  He will also receive Cosela 240 mg/m2 on the days of the chemotherapy.  Status post 9 cycles.  Starting from cycle #5 the patient will be on maintenance treatment with immunotherapy with Imfinzi 1500 mg IV every 4 weeks. Last dose of chemotherapy was given on November 13, 2020. This treatment was discontinued secondary to disease progression 3) Systemic chemotherapy with carboplatin for AUC of 5 on day 1, etoposide 100 mg/M2  on days 1, 2 and 3, Tecentriq 1200 mg IV every 3 weeks as well as Cosela 250 mg/M2 on the days of the chemotherapy every 3 weeks.  First dose December 18, 2020.  Status post 8 cycles. 4) Zepzelca 3.2 mgm2 IV every 3 weeks. Last dose on 01/23/22. Status post 12 cycles 5) Palliative systemic chemotherapy with irinotecan 65 mg/m2 on days 1 and 8 IV every 3 weeks.  Status post 3 cycles.  Last dose was given April 01, 2022 discontinued secondary to disease progression  CURRENT THERAPY: Patient referred to [facility] for ETCTN [id number] clinical trial which he declined due to travel burden. We are trying again with palliative systemic chemotherapy with carboplatin for an AC of 5 on day 1, etoposide 100 mg per metered squared on days 1, 2, and 3 with Cosela before chemotherapy.  First dose on 05/28/22.  Status post 1 cycle.    INTERVAL HISTORY: Adam Wise 71 y.o. male and I connected via telephone call today.  When the patient was seen in the clinic on 05/08/2022, the patient had evidence of disease progression.  Dr. Julien Nordmann had referred him to Dr. Mariel Kansky at [facility] for evaluation of enrollment into the [place]. He then was referred to [facility] Dr. Mindi Junker to see if he was a candidate for the ET CTN [id number] clinical trial.  The patient declined this due to the travel burden.  The patient opted to proceed with retrialing  systemic chemotherapy with carboplatin and etoposide.  He is status post 1 cycle and tolerated well except he mentions he was urinating a lot. He has diabetes and  has been checking his blood sugar. The excessive urination has subsided at this time. He was supposed to have weekly labs. Unclear why he has not had his labs drawn weekly.  Since last being seen, the patient denies any new concerning complaints today.  He denies any fever, chills, night sweats, or unexplained weight loss.  He reports stable dyspnea on exertion. He reports some days are better than others. He will use his inhaler  if needed. His cough has improved since last being seen since he recently got over a cold. He follows closely with pulmonology regarding his history of audible wheezing and recurrent effusions. He denies any chest pain or hemoptysis.  Denies any nausea, vomiting, diarrhea, or constipation.  Denies any headache or visual changes.  Denies any rashes or skin changes.  The purpose of this visit is for evaluation before considering starting cycle #2 which is scheduled for 06/18/2022.  The patient will also receive blood work that day     MEDICAL HISTORY: Past Medical History:  Diagnosis Date   Chickenpox    Chronic knee pain    Chronic low back pain    Coronary artery calcification seen on CAT scan 11/2021   Coronary CTA 11/27/2021: Moderate to large right pleural effusion and compressive atelectasis right lung base. => Coronary Calcium Score 657.  Diffuse RCA calcification.  Minimal mild disease in the LAD and diagonal branches. == Overall limited study.  Notable artifact.   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 He has disease progression after the first line of chemotherapy in December 2021.   Testosterone deficiency    Type 2 diabetes mellitus (HCC)     ALLERGIES:  is allergic to bupropion and hydrochlorothiazide.  MEDICATIONS:  Current Outpatient Medications  Medication Sig Dispense Refill   albuterol (VENTOLIN HFA) 108 (90 Base) MCG/ACT inhaler Inhale 2 puffs into the lungs every 6 (six) hours as needed for wheezing or shortness of breath. 18 g 0   amLODipine (NORVASC) 10 MG tablet TAKE 1 TABLET BY MOUTH EVERY DAY FOR BLOOD PRESSURE 90 tablet 3    B-D 3CC LUER-LOK SYR 22GX1" 22G X 1" 3 ML MISC USE AS INSTRUCTED FOR TESTOSTERONE INJECTION EVERY 2 WEEKS 10 each 2   bisoprolol (ZEBETA) 5 MG tablet TAKE 1 TABLET (5 MG TOTAL) BY MOUTH DAILY. 90 tablet 2   Black Pepper-Turmeric (TURMERIC COMPLEX/BLACK PEPPER PO) Take 1 tablet by mouth in the morning and at bedtime.     blood glucose meter kit and supplies KIT Dispense based on patient and insurance preference. Use up to four times daily as directed. 1 each 0   chlorpheniramine-HYDROcodone (TUSSIONEX) 10-8 MG/5ML SUER Take 5 mLs by mouth at bedtime as needed for cough. 473 mL 0   ELIQUIS 5 MG TABS tablet TAKE 1 TABLET BY MOUTH TWICE A DAY 180 tablet 2   esomeprazole (NEXIUM) 20 MG capsule Take 20 mg by mouth in the morning.     gabapentin (NEURONTIN) 300 MG capsule Take 1 capsule (300 mg total) by mouth 2 (two) times daily. For back pain. 180 capsule 1   glipiZIDE (GLUCOTROL) 10 MG tablet TAKE 1 TABLET (10 MG TOTAL) BY MOUTH  2 (TWO) TIMES DAILY BEFORE A MEAL. FOR DIABETES. 180 tablet 3   Insulin Glargine (BASAGLAR KWIKPEN) 100 UNIT/ML Inject 20 Units into the skin at bedtime. For diabetes. 30 mL 1   Insulin Pen Needle (BD PEN NEEDLE NANO U/F) 32G X 4 MM MISC Use with insulin as prescribed Dx Code: E11.9 200 each 3   ipratropium-albuterol (DUONEB) 0.5-2.5 (3) MG/3ML SOLN Take 3 mLs by nebulization every 4 (four) hours while awake for 3 days, THEN 3 mLs every 4 (four) hours as needed (shortness of breath or wheezing). 360 mL 0   Lancets MISC USE UP TO 3 TIMES DAILY AS DIRECTED 100 each 2   losartan (COZAAR) 50 MG tablet Take 1 tablet (50 mg total) by mouth daily. For blood pressure. 90 tablet 3   Melatonin 10 MG CAPS Take 10 mg by mouth at bedtime.     metFORMIN (GLUCOPHAGE) 1000 MG tablet TAKE 1 TABLET (1,000 MG TOTAL) BY MOUTH 2 (TWO) TIMES DAILY WITH A MEAL. FOR DIABETES 180 tablet 1   Multiple Vitamin (MULTI-VITAMINS) TABS Take 1 tablet by mouth in the morning.     Omega-3 Fatty Acids (SALMON OIL  PO) Take 1 capsule by mouth daily.     ONETOUCH ULTRA test strip USE UP TO 4 TIMES DAILY AS DIRECTED 100 strip 5   oxymetazoline (AFRIN) 0.05 % nasal spray Place 1 spray into both nostrils daily.     pioglitazone (ACTOS) 45 MG tablet TAKE 1 TABLET (45 MG TOTAL) BY MOUTH DAILY. FOR DIABETES. 90 tablet 1   pravastatin (PRAVACHOL) 40 MG tablet TAKE 1 TABLET BY MOUTH EVERY DAY IN THE EVENING FOR CHOLESTEROL 90 tablet 1   testosterone cypionate (DEPOTESTOSTERONE CYPIONATE) 200 MG/ML injection Inject 200 mg into the muscle every 14 (fourteen) days.     No current facility-administered medications for this visit.    SURGICAL HISTORY:  Past Surgical History:  Procedure Laterality Date   CHEST TUBE INSERTION Right 01/20/2022   Procedure: CHEST TUBE INSERTION;  Surgeon: Candee Furbish, MD;  Location[facility] ENDOSCOPY;  Service: Pulmonary;  Laterality: Right;  w/ Talc Pleurodesis, planned admit for Obs afterwards   CHEST TUBE INSERTION Right 04/28/2022   Procedure: INSERTION PLEURAL DRAINAGE CATHETER - Pigtail tail, drainage;  Surgeon: Candee Furbish, MD;  Location: [facility] ENDOSCOPY;  Service: Pulmonary;  Laterality: Right;  talc pleurodesis   COLONOSCOPY WITH PROPOFOL N/A 12/17/2018   Procedure: COLONOSCOPY WITH PROPOFOL;  Surgeon: Virgel Manifold, MD;  Location: [facility] ENDOSCOPY;  Service: Gastroenterology;  Laterality: N/A;   IR IMAGING GUIDED PORT INSERTION  04/17/2020   IR THORACENTESIS ASP PLEURAL SPACE W/IMG GUIDE  11/29/2021   IR THORACENTESIS ASP PLEURAL SPACE W/IMG GUIDE  01/03/2022   JOINT REPLACEMENT Bilateral    REPLACEMENT TOTAL KNEE BILATERAL  2015   TONSILLECTOMY  1960   TRANSTHORACIC ECHOCARDIOGRAM  05/2020   a) 05/2020: EF 55 to 60%.  No R WMA.  Mild LVH.  Indeterminate LVEDP.  Unable to assess RVP.  Normal aortic and mitral valves.  Mildly elevated RAP.; b) 09/2021: EF 50-55%. No RWMA. Mild LVH. ~ LVEDP. Mild LA Dilation. NORMAL RV/RAP.  Normal MV/AoV.    REVIEW OF SYSTEMS:   Review of  Systems  Constitutional: Negative for appetite change, chills, fatigue, fever and unexpected weight change.  HENT:   Negative for mouth sores, nosebleeds, sore throat and trouble swallowing.   Eyes: Negative for eye problems and icterus.  Respiratory: Positive for dyspnea on exertion. Negative for hemoptysis. Positive  for intermittent wheezing (none today). Cardiovascular: Negative for chest pain and leg swelling.  Gastrointestinal: Negative for abdominal pain, constipation, diarrhea, nausea and vomiting.  Genitourinary: Negative for bladder incontinence, difficulty urinating, dysuria, frequency and hematuria.   Musculoskeletal: Negative for back pain, gait problem, neck pain and neck stiffness.  Skin: Negative for itching and rash.  Neurological: Negative for dizziness, extremity weakness, gait problem, headaches, light-headedness and seizures.  Hematological: Negative for adenopathy. Does not bruise/bleed easily.  Psychiatric/Behavioral: Negative for confusion, depression and sleep disturbance. The patient is not nervous/anxious.     PHYSICAL EXAMINATION:  There were no vitals taken for this visit.  ECOG PERFORMANCE STATUS: 1  Physical Exam  Constitutional: Oriented to person, place, and time . Unable to assess.  Psychiatric: Mood, memory and judgment normal.  Vitals reviewed.  LABORATORY DATA: Lab Results  Component Value Date   WBC 8.2 05/28/2022   HGB 12.0 (L) 05/28/2022   HCT 36.8 (L) 05/28/2022   MCV 89.8 05/28/2022   PLT 194 05/28/2022      Chemistry      Component Value Date/Time   NA 138 05/28/2022 0941   K 4.4 05/28/2022 0941   CL 103 05/28/2022 0941   CO2 27 05/28/2022 0941   BUN 16 05/28/2022 0941   CREATININE 1.21 05/28/2022 0941   CREATININE 1.45 (H) 10/18/2021 1533      Component Value Date/Time   CALCIUM 9.4 05/28/2022 0941   ALKPHOS 72 05/28/2022 0941   AST 21 05/28/2022 0941   ALT 23 05/28/2022 0941   BILITOT 0.3 05/28/2022 0941        RADIOGRAPHIC STUDIES:  DG Chest 2 View  Result Date: 05/27/2022 CLINICAL DATA:  Pleural effusion EXAM: CHEST - 2 VIEW COMPARISON:  05/15/2022 FINDINGS: Right chest wall port catheter is unchanged. Stable lung aeration with right perihilar fibrotic changes. Decreased right pleural effusion. Stable cardiomediastinal contours. No acute osseous abnormality. IMPRESSION: Decreased right pleural effusion. Electronically Signed   By: Macy Mis M.D.   On: 05/27/2022 12:14     ASSESSMENT/PLAN:  This is a very pleasant 71 year old Caucasian male diagnosed with extensive stage (T4, N3, M1C) small cell lung cancer presented with large right upper lobe lung mass in addition to mediastinal and right supraclavicular lymphadenopathy and multiple metastatic liver lesions diagnosed in April 2021.   The patient initially underwent systemic chemotherapy with carboplatin for an AUC of 5 on day 1, etoposide 100 mg per metered squared on days 1, 2, and 3 in addition to [facility] for myeloprotection.  He also received immunotherapy with Imfinzi on day 1 of every chemotherapy cycle.  He is status post 8 cycles.  Starting from cycle #5 he was on single agent immunotherapy with Imfinzi IV every 4 weeks  The patient had evidence of disease progression on his scan from December 2021.   He was then started on systemic chemotherapy with carboplatin for an AUC of 5 on day 1, etoposide 100 mg per metered squared on days 1, 2, and 3 in addition to Tecentriq 1200 mg on day 1, the patient is status post 8 cycles.  Starting from cycle #5, the patient started maintenance immunotherapy with Tecentriq.  This was discontinued due to evidence of disease progression.   The patient then underwent Zepzelca IV every 3 weeks.  He is status post his 12 cycles and he tolerated it well except for fatigue a few days after treatment.  This was discontinued in February 2023 due to evidence of disease progression.  The patient then  underwent treatment with irinotecan 65 mg per metered squared on days 1 and 8 IV every 3 weeks.  The patient is status post 3 cycles and tolerated it fairly well without any concerning adverse side effects.  This was discontinued secondary to disease progression.  Dr. Julien Nordmann saw the patient after this to discuss the options including palliative care and hospice versus single agent Gemzar, or Taxol.  He also discussed repeating his initial treatment with carboplatin and etoposide since he had a good response to treatment in the past..    He was referred to Dr. Mariel Kansky at [facility] for consideration of enrollment in the clinical trial with [email or web address].    He also saw Dr. Mindi Junker at [facility] for consideration of enrollment in the ETCTN [id number] trial.  The patient was not interested due to the travel burden.  The patient was interested in restarting systemic chemotherapy with carboplatin for an AUC of 5 on day 1 and etoposide 100 mg per metered squared on days 1, 2, and 3 with Cosela. He is status post 1 cycle and tolerated it well.   The patient will proceed with cycle #2 on Wednesday as long as his labs are within parameters.  I will arrange for restaging scan of the chest, abdomen, pelvis prior to starting cycle #3.  We will see the patient back for follow-up visit in 3 weeks for evaluation and to review his scan results before starting cycle #3.  I discussed with the patient that we will need to monitor his labs on a weekly basis.  I will ensure that he has weekly labs and flush appointments moving forward.   I discussed the assessment and treatment plan with the patient. The patient was provided an opportunity to ask questions and all were answered. The patient agreed with the plan and demonstrated an understanding of the instructions.  The patient was advised to call back or seek an in-person evaluation if the symptoms worsen or if the condition fails to improve as anticipated.  I provided 20-29  minutes of non face-to-face telephone visit time during this encounter, and > 50% was spent counseling as documented under my assessment & plan.  Cassandra L Heilingoetter, PA-C 06/16/2022 1:26 PM  Orders Placed This Encounter  Procedures   CT Chest W Contrast    Standing Status:   Future    Standing Expiration Date:   06/16/2023    Order Specific Question:   If indicated for the ordered procedure, I authorize the administration of contrast media per Radiology protocol    Answer:   Yes    Order Specific Question:   Preferred imaging location?    Answer:   [facility]   CT Abdomen Pelvis W Contrast    Standing Status:   Future    Standing Expiration Date:   06/16/2023    Order Specific Question:   If indicated for the ordered procedure, I authorize the administration of contrast media per Radiology protocol    Answer:   Yes    Order Specific Question:   Preferred imaging location?    Answer:   [facility]    Order Specific Question:   Is Oral Contrast requested for this exam?    Answer:   Yes, Per Radiology protocol     Cassandra L Heilingoetter, PA-C 06/16/22

## 2022-06-13 ENCOUNTER — Ambulatory Visit: Payer: Medicare Other

## 2022-06-16 ENCOUNTER — Inpatient Hospital Stay: Payer: Medicare Other | Attending: Internal Medicine | Admitting: Physician Assistant

## 2022-06-16 DIAGNOSIS — C787 Secondary malignant neoplasm of liver and intrahepatic bile duct: Secondary | ICD-10-CM | POA: Insufficient documentation

## 2022-06-16 DIAGNOSIS — Z5112 Encounter for antineoplastic immunotherapy: Secondary | ICD-10-CM | POA: Insufficient documentation

## 2022-06-16 DIAGNOSIS — C3411 Malignant neoplasm of upper lobe, right bronchus or lung: Secondary | ICD-10-CM | POA: Diagnosis not present

## 2022-06-16 DIAGNOSIS — Z5111 Encounter for antineoplastic chemotherapy: Secondary | ICD-10-CM | POA: Insufficient documentation

## 2022-06-16 MED FILL — Dexamethasone Sodium Phosphate Inj 100 MG/10ML: INTRAMUSCULAR | Qty: 1 | Status: AC

## 2022-06-16 MED FILL — Fosaprepitant Dimeglumine For IV Infusion 150 MG (Base Eq): INTRAVENOUS | Qty: 5 | Status: AC

## 2022-06-18 ENCOUNTER — Other Ambulatory Visit: Payer: Self-pay

## 2022-06-18 ENCOUNTER — Inpatient Hospital Stay: Payer: Medicare Other

## 2022-06-18 VITALS — BP 121/74 | HR 87 | Temp 98.3°F | Resp 18 | Ht 68.5 in | Wt 293.0 lb

## 2022-06-18 DIAGNOSIS — Z95828 Presence of other vascular implants and grafts: Secondary | ICD-10-CM

## 2022-06-18 DIAGNOSIS — Z5111 Encounter for antineoplastic chemotherapy: Secondary | ICD-10-CM | POA: Diagnosis not present

## 2022-06-18 DIAGNOSIS — Z5112 Encounter for antineoplastic immunotherapy: Secondary | ICD-10-CM | POA: Diagnosis not present

## 2022-06-18 DIAGNOSIS — C3411 Malignant neoplasm of upper lobe, right bronchus or lung: Secondary | ICD-10-CM

## 2022-06-18 DIAGNOSIS — C787 Secondary malignant neoplasm of liver and intrahepatic bile duct: Secondary | ICD-10-CM | POA: Diagnosis not present

## 2022-06-18 LAB — CMP (CANCER CENTER ONLY)
ALT: 17 U/L (ref 0–44)
AST: 18 U/L (ref 15–41)
Albumin: 3.7 g/dL (ref 3.5–5.0)
Alkaline Phosphatase: 65 U/L (ref 38–126)
Anion gap: 5 (ref 5–15)
BUN: 12 mg/dL (ref 8–23)
CO2: 30 mmol/L (ref 22–32)
Calcium: 8.9 mg/dL (ref 8.9–10.3)
Chloride: 105 mmol/L (ref 98–111)
Creatinine: 1.13 mg/dL (ref 0.61–1.24)
GFR, Estimated: >60 mL/min (ref >60)
Glucose, Bld: 140 mg/dL — ABNORMAL HIGH (ref 70–99)
Potassium: 4.3 mmol/L (ref 3.5–5.1)
Sodium: 140 mmol/L (ref 135–145)
Total Bilirubin: 0.3 mg/dL (ref 0.3–1.2)
Total Protein: 6.2 g/dL — ABNORMAL LOW (ref 6.5–8.1)

## 2022-06-18 LAB — CBC WITH DIFFERENTIAL (CANCER CENTER ONLY)
Abs Immature Granulocytes: 0.05 10*3/uL (ref 0.00–0.07)
Basophils Absolute: 0 10*3/uL (ref 0.0–0.1)
Basophils Relative: 1 %
Eosinophils Absolute: 0.1 10*3/uL (ref 0.0–0.5)
Eosinophils Relative: 3 %
HCT: 34.1 % — ABNORMAL LOW (ref 39.0–52.0)
Hemoglobin: 11 g/dL — ABNORMAL LOW (ref 13.0–17.0)
Immature Granulocytes: 2 %
Lymphocytes Relative: 21 %
Lymphs Abs: 0.5 10*3/uL — ABNORMAL LOW (ref 0.7–4.0)
MCH: 28.9 pg (ref 26.0–34.0)
MCHC: 32.3 g/dL (ref 30.0–36.0)
MCV: 89.7 fL (ref 80.0–100.0)
Monocytes Absolute: 0.6 10*3/uL (ref 0.1–1.0)
Monocytes Relative: 22 %
Neutro Abs: 1.3 10*3/uL — ABNORMAL LOW (ref 1.7–7.7)
Neutrophils Relative %: 51 %
Platelet Count: 185 10*3/uL (ref 150–400)
RBC: 3.8 MIL/uL — ABNORMAL LOW (ref 4.22–5.81)
RDW: 15.9 % — ABNORMAL HIGH (ref 11.5–15.5)
WBC Count: 2.5 10*3/uL — ABNORMAL LOW (ref 4.0–10.5)
nRBC: 0.8 % — ABNORMAL HIGH (ref 0.0–0.2)

## 2022-06-18 MED ORDER — DIPHENHYDRAMINE HCL 25 MG PO CAPS
25.0000 mg | ORAL_CAPSULE | Freq: Once | ORAL | Status: AC
  Administered 2022-06-18: 25 mg via ORAL
  Filled 2022-06-18: qty 1

## 2022-06-18 MED ORDER — HEPARIN SOD (PORK) LOCK FLUSH 100 UNIT/ML IV SOLN
500.0000 [IU] | Freq: Once | INTRAVENOUS | Status: AC | PRN
  Administered 2022-06-18: 500 [IU]

## 2022-06-18 MED ORDER — SODIUM CHLORIDE 0.9% FLUSH
10.0000 mL | Freq: Once | INTRAVENOUS | Status: AC
  Administered 2022-06-18: 10 mL

## 2022-06-18 MED ORDER — SODIUM CHLORIDE 0.9 % IV SOLN
100.0000 mg/m2 | Freq: Once | INTRAVENOUS | Status: AC
  Administered 2022-06-18: 260 mg via INTRAVENOUS
  Filled 2022-06-18: qty 13

## 2022-06-18 MED ORDER — SODIUM CHLORIDE 0.9 % IV SOLN: Freq: Once | INTRAVENOUS | Status: AC

## 2022-06-18 MED ORDER — FAMOTIDINE IN NACL 20-0.9 MG/50ML-% IV SOLN
20.0000 mg | Freq: Once | INTRAVENOUS | Status: AC
  Administered 2022-06-18: 20 mg via INTRAVENOUS
  Filled 2022-06-18: qty 50

## 2022-06-18 MED ORDER — TRILACICLIB DIHYDROCHLORIDE INJECTION 300 MG
240.0000 mg/m2 | Freq: Once | INTRAVENOUS | Status: AC
  Administered 2022-06-18: 600 mg via INTRAVENOUS
  Filled 2022-06-18: qty 40

## 2022-06-18 MED ORDER — SODIUM CHLORIDE 0.9 % IV SOLN
682.5000 mg | Freq: Once | INTRAVENOUS | Status: AC
  Administered 2022-06-18: 680 mg via INTRAVENOUS
  Filled 2022-06-18: qty 68

## 2022-06-18 MED ORDER — SODIUM CHLORIDE 0.9% FLUSH
10.0000 mL | INTRAVENOUS | Status: DC | PRN
  Administered 2022-06-18: 10 mL

## 2022-06-18 MED ORDER — PALONOSETRON HCL INJECTION 0.25 MG/5ML
0.2500 mg | Freq: Once | INTRAVENOUS | Status: AC
  Administered 2022-06-18: 0.25 mg via INTRAVENOUS
  Filled 2022-06-18: qty 5

## 2022-06-18 MED ORDER — SODIUM CHLORIDE 0.9 % IV SOLN
150.0000 mg | Freq: Once | INTRAVENOUS | Status: AC
  Administered 2022-06-18: 150 mg via INTRAVENOUS
  Filled 2022-06-18: qty 150

## 2022-06-18 MED ORDER — SODIUM CHLORIDE 0.9 % IV SOLN
10.0000 mg | Freq: Once | INTRAVENOUS | Status: AC
  Administered 2022-06-18: 10 mg via INTRAVENOUS
  Filled 2022-06-18: qty 10

## 2022-06-18 MED FILL — Dexamethasone Sodium Phosphate Inj 100 MG/10ML: INTRAMUSCULAR | Qty: 1 | Status: AC

## 2022-06-18 NOTE — Patient Instructions (Addendum)
[facility]COL[facility]   Discharge Instructions: Thank you for choosing [facility] to provide your oncology and hematology care.   If you have a lab appointment with the [facility], please go directly to the [facility] and check in at the registration area.   Wear comfortable clothing and clothing appropriate for easy access to any Portacath or PICC line.   We strive to give you quality time with your provider. You may need to reschedule your appointment if you arrive late (15 or more minutes).  Arriving late affects you and other patients whose appointments are after yours.  Also, if you miss three or more appointments without notifying the office, you may be dismissed from the clinic at the provider's discretion.      For prescription refill requests, have your pharmacy contact our office and allow 72 hours for refills to be completed.    Today you received the following chemotherapy and/or immunotherapy agents: trilaciclib dihydrochloride (Cosela), Etoposide, and Carboplatin      To help prevent nausea and vomiting after your treatment, we encourage you to take your nausea medication as directed.  BELOW ARE SYMPTOMS THAT SHOULD BE REPORTED IMMEDIATELY: *FEVER GREATER THAN 100.4 F (38 C) OR HIGHER *CHILLS OR SWEATING *NAUSEA AND VOMITING THAT IS NOT CONTROLLED WITH YOUR NAUSEA MEDICATION *UNUSUAL SHORTNESS OF BREATH *UNUSUAL BRUISING OR BLEEDING *URINARY PROBLEMS (pain or burning when urinating, or frequent urination) *BOWEL PROBLEMS (unusual diarrhea, constipation, pain near the anus) TENDERNESS IN MOUTH AND THROAT WITH OR WITHOUT PRESENCE OF ULCERS (sore throat, sores in mouth, or a toothache) UNUSUAL RASH, SWELLING OR PAIN  UNUSUAL VAGINAL DISCHARGE OR ITCHING   Items with * indicate a potential emergency and should be followed up as soon as possible or go to the Emergency Department if any problems should occur.  Please show the  CHEMOTHERAPY ALERT CARD or IMMUNOTHERAPY ALERT CARD at check-in to the Emergency Department and triage nurse.  Should you have questions after your visit or need to cancel or reschedule your appointment, please contact [facility]  Dept: [phone]  and follow the prompts.  Office hours are 8:00 a.m. to 4:30 p.m. Monday - Friday. Please note that voicemails left after 4:00 p.m. may not be returned until the following business day.  We are closed weekends and major holidays. You have access to a nurse at all times for urgent questions. Please call the main number to the clinic Dept: [phone] and follow the prompts.   For any non-urgent questions, you may also contact your provider using MyChart. We now offer e-Visits for anyone 72 and older to request care online for non-urgent symptoms. For details visit [email or web address].   Also download the MyChart app! Go to the app store, search "MyChart", open the app, select [facility], and log in with your MyChart username and password.  Masks are optional in the cancer centers. If you would like for your care team to wear a mask while they are taking care of you, please let them know. For doctor visits, patients may have with them one support person who is at least 72 years old. At this time, visitors are not allowed in the infusion area.

## 2022-06-18 NOTE — Progress Notes (Signed)
Per Dr. Julien Nordmann - okay to proceed with ANC of 1.3 today. Patient educated on neutropenic precautions.

## 2022-06-19 ENCOUNTER — Inpatient Hospital Stay: Payer: Medicare Other

## 2022-06-19 VITALS — BP 145/84 | HR 98 | Temp 98.1°F | Resp 20

## 2022-06-19 DIAGNOSIS — C3411 Malignant neoplasm of upper lobe, right bronchus or lung: Secondary | ICD-10-CM | POA: Diagnosis not present

## 2022-06-19 DIAGNOSIS — Z5112 Encounter for antineoplastic immunotherapy: Secondary | ICD-10-CM | POA: Diagnosis not present

## 2022-06-19 DIAGNOSIS — Z5111 Encounter for antineoplastic chemotherapy: Secondary | ICD-10-CM | POA: Diagnosis not present

## 2022-06-19 DIAGNOSIS — C787 Secondary malignant neoplasm of liver and intrahepatic bile duct: Secondary | ICD-10-CM | POA: Diagnosis not present

## 2022-06-19 MED ORDER — SODIUM CHLORIDE 0.9 % IV SOLN
100.0000 mg/m2 | Freq: Once | INTRAVENOUS | Status: AC
  Administered 2022-06-19: 260 mg via INTRAVENOUS
  Filled 2022-06-19: qty 13

## 2022-06-19 MED ORDER — HEPARIN SOD (PORK) LOCK FLUSH 100 UNIT/ML IV SOLN
500.0000 [IU] | Freq: Once | INTRAVENOUS | Status: AC | PRN
  Administered 2022-06-19: 500 [IU]

## 2022-06-19 MED ORDER — SODIUM CHLORIDE 0.9% FLUSH
10.0000 mL | INTRAVENOUS | Status: DC | PRN
  Administered 2022-06-19: 10 mL

## 2022-06-19 MED ORDER — SODIUM CHLORIDE 0.9 % IV SOLN
10.0000 mg | Freq: Once | INTRAVENOUS | Status: AC
  Administered 2022-06-19: 10 mg via INTRAVENOUS
  Filled 2022-06-19: qty 10

## 2022-06-19 MED ORDER — TRILACICLIB DIHYDROCHLORIDE INJECTION 300 MG
240.0000 mg/m2 | Freq: Once | INTRAVENOUS | Status: AC
  Administered 2022-06-19: 600 mg via INTRAVENOUS
  Filled 2022-06-19: qty 40

## 2022-06-19 MED ORDER — SODIUM CHLORIDE 0.9 % IV SOLN: Freq: Once | INTRAVENOUS | Status: AC

## 2022-06-19 MED FILL — Dexamethasone Sodium Phosphate Inj 100 MG/10ML: INTRAMUSCULAR | Qty: 1 | Status: AC

## 2022-06-19 NOTE — Patient Instructions (Signed)
[facility]COLOG[facility]   Discharge Instructions: Thank you for choosing [facility] to provide your oncology and hematology care.   If you have a lab appointment with the [facility], please go directly to the [facility] and check in at the registration area.   Wear comfortable clothing and clothing appropriate for easy access to any Portacath or PICC line.   We strive to give you quality time with your provider. You may need to reschedule your appointment if you arrive late (15 or more minutes).  Arriving late affects you and other patients whose appointments are after yours.  Also, if you miss three or more appointments without notifying the office, you may be dismissed from the clinic at the provider's discretion.      For prescription refill requests, have your pharmacy contact our office and allow 72 hours for refills to be completed.    Today you received the following chemotherapy and/or immunotherapy agents: trilaciclib dihydrochloride (Cosela) and Etoposide      To help prevent nausea and vomiting after your treatment, we encourage you to take your nausea medication as directed.  BELOW ARE SYMPTOMS THAT SHOULD BE REPORTED IMMEDIATELY: *FEVER GREATER THAN 100.4 F (38 C) OR HIGHER *CHILLS OR SWEATING *NAUSEA AND VOMITING THAT IS NOT CONTROLLED WITH YOUR NAUSEA MEDICATION *UNUSUAL SHORTNESS OF BREATH *UNUSUAL BRUISING OR BLEEDING *URINARY PROBLEMS (pain or burning when urinating, or frequent urination) *BOWEL PROBLEMS (unusual diarrhea, constipation, pain near the anus) TENDERNESS IN MOUTH AND THROAT WITH OR WITHOUT PRESENCE OF ULCERS (sore throat, sores in mouth, or a toothache) UNUSUAL RASH, SWELLING OR PAIN  UNUSUAL VAGINAL DISCHARGE OR ITCHING   Items with * indicate a potential emergency and should be followed up as soon as possible or go to the Emergency Department if any problems should occur.  Please show the CHEMOTHERAPY ALERT CARD  or IMMUNOTHERAPY ALERT CARD at check-in to the Emergency Department and triage nurse.  Should you have questions after your visit or need to cancel or reschedule your appointment, please contact [facility]  Dept: [phone]  and follow the prompts.  Office hours are 8:00 a.m. to 4:30 p.m. Monday - Friday. Please note that voicemails left after 4:00 p.m. may not be returned until the following business day.  We are closed weekends and major holidays. You have access to a nurse at all times for urgent questions. Please call the main number to the clinic Dept: [phone] and follow the prompts.   For any non-urgent questions, you may also contact your provider using MyChart. We now offer e-Visits for anyone 17 and older to request care online for non-urgent symptoms. For details visit [email or web address].   Also download the MyChart app! Go to the app store, search "MyChart", open the app, select [facility], and log in with your MyChart username and password.  Masks are optional in the cancer centers. If you would like for your care team to wear a mask while they are taking care of you, please let them know. For doctor visits, patients may have with them one support person who is at least 72 years old. At this time, visitors are not allowed in the infusion area.

## 2022-06-20 ENCOUNTER — Other Ambulatory Visit: Payer: Self-pay

## 2022-06-20 ENCOUNTER — Inpatient Hospital Stay: Payer: Medicare Other

## 2022-06-20 VITALS — BP 142/76 | HR 86 | Temp 98.1°F | Resp 18

## 2022-06-20 DIAGNOSIS — C3411 Malignant neoplasm of upper lobe, right bronchus or lung: Secondary | ICD-10-CM

## 2022-06-20 DIAGNOSIS — Z5111 Encounter for antineoplastic chemotherapy: Secondary | ICD-10-CM | POA: Diagnosis not present

## 2022-06-20 DIAGNOSIS — C787 Secondary malignant neoplasm of liver and intrahepatic bile duct: Secondary | ICD-10-CM | POA: Diagnosis not present

## 2022-06-20 DIAGNOSIS — Z5112 Encounter for antineoplastic immunotherapy: Secondary | ICD-10-CM | POA: Diagnosis not present

## 2022-06-20 MED ORDER — SODIUM CHLORIDE 0.9% FLUSH
10.0000 mL | INTRAVENOUS | Status: DC | PRN
  Administered 2022-06-20: 10 mL

## 2022-06-20 MED ORDER — SODIUM CHLORIDE 0.9 % IV SOLN: Freq: Once | INTRAVENOUS | Status: AC

## 2022-06-20 MED ORDER — TRILACICLIB DIHYDROCHLORIDE INJECTION 300 MG
240.0000 mg/m2 | Freq: Once | INTRAVENOUS | Status: AC
  Administered 2022-06-20: 600 mg via INTRAVENOUS
  Filled 2022-06-20: qty 40

## 2022-06-20 MED ORDER — HEPARIN SOD (PORK) LOCK FLUSH 100 UNIT/ML IV SOLN
500.0000 [IU] | Freq: Once | INTRAVENOUS | Status: AC | PRN
  Administered 2022-06-20: 500 [IU]

## 2022-06-20 MED ORDER — SODIUM CHLORIDE 0.9 % IV SOLN
100.0000 mg/m2 | Freq: Once | INTRAVENOUS | Status: AC
  Administered 2022-06-20: 260 mg via INTRAVENOUS
  Filled 2022-06-20: qty 13

## 2022-06-20 MED ORDER — SODIUM CHLORIDE 0.9 % IV SOLN
10.0000 mg | Freq: Once | INTRAVENOUS | Status: AC
  Administered 2022-06-20: 10 mg via INTRAVENOUS
  Filled 2022-06-20: qty 10

## 2022-06-20 NOTE — Patient Instructions (Signed)
[facility] ONCOLOGY  Discharge Instructions: Thank you for choosing [facility] to provide your oncology and hematology care.   If you have a lab appointment with the [facility], please go directly to the [facility] and check in at the registration area.   Wear comfortable clothing and clothing appropriate for easy access to any Portacath or PICC line.   We strive to give you quality time with your provider. You may need to reschedule your appointment if you arrive late (15 or more minutes).  Arriving late affects you and other patients whose appointments are after yours.  Also, if you miss three or more appointments without notifying the office, you may be dismissed from the clinic at the provider's discretion.      For prescription refill requests, have your pharmacy contact our office and allow 72 hours for refills to be completed.    Today you received the following chemotherapy and/or immunotherapy agents: Etoposide      To help prevent nausea and vomiting after your treatment, we encourage you to take your nausea medication as directed.  BELOW ARE SYMPTOMS THAT SHOULD BE REPORTED IMMEDIATELY: *FEVER GREATER THAN 100.4 F (38 C) OR HIGHER *CHILLS OR SWEATING *NAUSEA AND VOMITING THAT IS NOT CONTROLLED WITH YOUR NAUSEA MEDICATION *UNUSUAL SHORTNESS OF BREATH *UNUSUAL BRUISING OR BLEEDING *URINARY PROBLEMS (pain or burning when urinating, or frequent urination) *BOWEL PROBLEMS (unusual diarrhea, constipation, pain near the anus) TENDERNESS IN MOUTH AND THROAT WITH OR WITHOUT PRESENCE OF ULCERS (sore throat, sores in mouth, or a toothache) UNUSUAL RASH, SWELLING OR PAIN  UNUSUAL VAGINAL DISCHARGE OR ITCHING   Items with * indicate a potential emergency and should be followed up as soon as possible or go to the Emergency Department if any problems should occur.  Please show the CHEMOTHERAPY ALERT CARD or IMMUNOTHERAPY ALERT CARD at check-in to  the Emergency Department and triage nurse.  Should you have questions after your visit or need to cancel or reschedule your appointment, please contact [facility]  Dept: [phone]  and follow the prompts.  Office hours are 8:00 a.m. to 4:30 p.m. Monday - Friday. Please note that voicemails left after 4:00 p.m. may not be returned until the following business day.  We are closed weekends and major holidays. You have access to a nurse at all times for urgent questions. Please call the main number to the clinic Dept: [phone] and follow the prompts.   For any non-urgent questions, you may also contact your provider using MyChart. We now offer e-Visits for anyone 72 and older to request care online for non-urgent symptoms. For details visit [email or web address].   Also download the MyChart app! Go to the app store, search "MyChart", open the app, select [facility], and log in with your MyChart username and password.  Masks are optional in the cancer centers. If you would like for your care team to wear a mask while they are taking care of you, please let them know. For doctor visits, patients may have with them one support person who is at least 72 years old. At this time, visitors are not allowed in the infusion area.

## 2022-06-24 ENCOUNTER — Encounter: Payer: Self-pay | Admitting: Primary Care

## 2022-06-24 ENCOUNTER — Ambulatory Visit (INDEPENDENT_AMBULATORY_CARE_PROVIDER_SITE_OTHER): Payer: Medicare Other | Admitting: Primary Care

## 2022-06-24 VITALS — BP 124/70 | HR 85 | Temp 97.5°F | Ht 68.5 in | Wt 290.0 lb

## 2022-06-24 DIAGNOSIS — Z86711 Personal history of pulmonary embolism: Secondary | ICD-10-CM

## 2022-06-24 DIAGNOSIS — E119 Type 2 diabetes mellitus without complications: Secondary | ICD-10-CM

## 2022-06-24 DIAGNOSIS — Z125 Encounter for screening for malignant neoplasm of prostate: Secondary | ICD-10-CM

## 2022-06-24 DIAGNOSIS — Z794 Long term (current) use of insulin: Secondary | ICD-10-CM

## 2022-06-24 DIAGNOSIS — E1165 Type 2 diabetes mellitus with hyperglycemia: Secondary | ICD-10-CM

## 2022-06-24 DIAGNOSIS — E785 Hyperlipidemia, unspecified: Secondary | ICD-10-CM

## 2022-06-24 DIAGNOSIS — E1169 Type 2 diabetes mellitus with other specified complication: Secondary | ICD-10-CM | POA: Diagnosis not present

## 2022-06-24 DIAGNOSIS — C3411 Malignant neoplasm of upper lobe, right bronchus or lung: Secondary | ICD-10-CM | POA: Diagnosis not present

## 2022-06-24 DIAGNOSIS — J90 Pleural effusion, not elsewhere classified: Secondary | ICD-10-CM

## 2022-06-24 DIAGNOSIS — G4733 Obstructive sleep apnea (adult) (pediatric): Secondary | ICD-10-CM | POA: Diagnosis not present

## 2022-06-24 DIAGNOSIS — I10 Essential (primary) hypertension: Secondary | ICD-10-CM

## 2022-06-24 DIAGNOSIS — M545 Low back pain, unspecified: Secondary | ICD-10-CM

## 2022-06-24 DIAGNOSIS — G8929 Other chronic pain: Secondary | ICD-10-CM

## 2022-06-24 LAB — POCT GLYCOSYLATED HEMOGLOBIN (HGB A1C): Hemoglobin A1C: 8.2 % — AB (ref 4.0–5.6)

## 2022-06-24 MED ORDER — BASAGLAR KWIKPEN 100 UNIT/ML ~~LOC~~ SOPN
24.0000 [IU] | PEN_INJECTOR | Freq: Every day | SUBCUTANEOUS | 1 refills | Status: DC
Start: 2022-06-24 — End: 2023-03-25

## 2022-06-24 NOTE — Assessment & Plan Note (Signed)
Following with pulmonology, continue CPAP machine nightly.

## 2022-06-24 NOTE — Assessment & Plan Note (Signed)
Overall controlled.  Continue gabapentin 300 mg twice daily.

## 2022-06-24 NOTE — Assessment & Plan Note (Signed)
Following with oncology, medical oncology, pulmonology.  Oncology and pulmonology notes reviewed from June and July 2023.  Anticipate continued hyperglycemia since he has resumed chemotherapy. We will adjust his insulin regimen accordingly.

## 2022-06-24 NOTE — Patient Instructions (Addendum)
Increase your Basaglar to 24 units daily.  Update me with readings in 2 weeks.   Please have the cancer center draw my labs tomorrow.  Please schedule a follow up visit for 3 months for diabetes check.  It was a pleasure to see you today!

## 2022-06-24 NOTE — Assessment & Plan Note (Signed)
Controlled.  Continue amlodipine 10 mg daily, bisoprolol 5 mg daily, losartan 50 mg daily. CMP reviewed from July 2023.

## 2022-06-24 NOTE — Progress Notes (Signed)
Subjective:    Patient ID: Adam Wise, male    DOB: October 13, 1951, 71 y.o.   MRN: [id number]  HPI  Adam Wise is a very pleasant 71 y.o. male with a history of hypertension, small cell lung cancer, COPD, recurrent pleural effusion, type 2 diabetes, hyperlipidemia, hypogonadism who presents today for follow-up of chronic conditions.  1) Small Cell Carcinoma/Recurrent Plerual Effusion: Following with oncology and pulmonology.  Last oncology visit was 06/16/2022.  CT chest/abdomen/pelvis in May 2023 showed progressive disease.  He has resumed chemotherapy and is currently managed on systemic chemotherapy with carboplatin, etoposide, Cosela with preload of Decadron, has completed 2 cycles thus far.  He is pending a CT chest/abdomen/pelvis in the near future.  Following with pulmonology, last office visit was 05/27/2022.  History of recurrent right pleural effusions with subsequent multiple thoracentesis and talc pleurodesis.  He underwent chest x-ray that day which showed reduction in right pleural effusion.  Currently managed on albuterol as needed, DuoNeb treatments as needed.  BP Readings from Last 3 Encounters:  06/24/22 124/70  06/20/22 (!) 142/76  06/19/22 (!) 145/84   2) Type 2 Diabetes:   Current medications include: Basaglar 20-22 units daily depending on chemotherapy days. Glipizide 10 mg BID, Actos 45 mg, metformin 1000 mg BID.   He  is checking her blood glucose 1-2 times daily and is getting readings of:  AM fasting: 120's Bedtime: low 200's  Since resuming chemotherapy he's starting to see hyperglycemia with readings in the 400's. He is provided Decadron with each treatment.   Last A1C: 7.0 in December 2022, 8.2 today Last Eye Exam: Due Last Foot Exam: UTD Pneumonia Vaccination: 2019 Urine Microalbumin: none. Managed on ARB. Statin: pravastatin   3) Hypertension/Hyperlipidemia/History of PE: Currently managed on Eliquis 5 mg twice daily, bisoprolol 5 mg daily, amlodipine  10 mg daily, losartan 50 mg daily, pravastatin 40 mg daily.  He denies chest pain.  He does experience intermittent exertional dyspnea for which she attributes to his pleural effusion and lung cancer.  Following with cardiology, last office visit was 04/10/2022, no changes to his regimen were made.  BP Readings from Last 3 Encounters:  06/24/22 124/70  06/20/22 (!) 142/76  06/19/22 (!) 145/84     Review of Systems  Constitutional:  Negative for fever.  Respiratory:  Positive for shortness of breath.   Cardiovascular:  Negative for chest pain.  Musculoskeletal:  Positive for arthralgias.  Neurological:  Negative for dizziness and headaches.         Past Medical History:  Diagnosis Date   Chickenpox    Chronic knee pain    Chronic low back pain    COPD exacerbation ([facility]) 10/11/2021   COPD with exacerbation ([facility]) 10/12/2021   Coronary artery calcification seen on CAT scan 11/2021   Coronary CTA 11/27/2021: Moderate to large right pleural effusion and compressive atelectasis right lung base. => Coronary Calcium Score 657.  Diffuse RCA calcification.  Minimal mild disease in the LAD and diagonal branches. == Overall limited study.  Notable artifact.   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 He has disease  progression after the first line of chemotherapy in December 2021.   Testosterone deficiency    Type 2 diabetes mellitus ([facility])     Social History   Socioeconomic History   Marital status: Married    Spouse name: Not on file   Number of children: Not on file   Years of  education: Not on file   Highest education level: Not on file  Occupational History   Not on file  Tobacco Use   Smoking status: Former   Smokeless tobacco: Never  Vaping Use   Vaping Use: Never used  Substance and Sexual Activity   Alcohol use: Not Currently    Comment: rarely   Drug use: Not Currently   Sexual activity: Yes  Other Topics Concern   Not on file  Social History Narrative   Married.   Moved from [place].   Retired.   Social Determinants of Health   Financial Resource Strain: Low Risk  (02/14/2022)   Overall Financial Resource Strain (CARDIA)    Difficulty of Paying Living Expenses: Not hard at all  Food Insecurity: No Food Insecurity (02/14/2022)   Hunger Vital Sign    Worried About Running Out of Food in the Last Year: Never true    Ran Out of Food in the Last Year: Never true  Transportation Needs: No Transportation Needs (02/14/2022)   PRAPARE - Hydrologist (Medical): No    Lack of Transportation (Non-Medical): No  Physical Activity: Inactive (02/14/2022)   Exercise Vital Sign    Days of Exercise per Week: 0 days    Minutes of Exercise per Session: 0 min  Stress: No Stress Concern Present (02/14/2022)   [place]    Feeling of Stress : Only a little  Social Connections: Moderately Integrated (02/14/2022)   Social Connection and Isolation Panel [NHANES]    Frequency of Communication with Friends and Family: Twice a week    Frequency of Social Gatherings with Friends and Family: Twice a week    Attends Religious Services: 1 to 4 times per year    Active Member of [employer] or Organizations: No    Attends Archivist Meetings: Never    Marital Status: Married  Human resources officer Violence: Not At Risk (02/14/2022)   Humiliation, Afraid, Rape, and Kick questionnaire    Fear of Current or Ex-Partner: No    Emotionally Abused: No    Physically Abused: No    Sexually Abused:  No    Past Surgical History:  Procedure Laterality Date   CHEST TUBE INSERTION Right 01/20/2022   Procedure: CHEST TUBE INSERTION;  Surgeon: Candee Furbish, MD;  Location: [facility] ENDOSCOPY;  Service: Pulmonary;  Laterality: Right;  w/ Talc Pleurodesis, planned admit for Obs afterwards   CHEST TUBE INSERTION Right 04/28/2022   Procedure: INSERTION PLEURAL DRAINAGE CATHETER - Pigtail tail, drainage;  Surgeon: Candee Furbish, MD;  Location[facility] ENDOSCOPY;  Service: Pulmonary;  Laterality: Right;  talc pleurodesis   COLONOSCOPY WITH PROPOFOL N/A 12/17/2018   Procedure: COLONOSCOPY WITH PROPOFOL;  Surgeon: Virgel Manifold, MD;  Location: [facility] ENDOSCOPY;  Service: Gastroenterology;  Laterality: N/A;   IR IMAGING GUIDED PORT INSERTION  04/17/2020   IR THORACENTESIS ASP PLEURAL SPACE W/IMG GUIDE  11/29/2021   IR THORACENTESIS ASP PLEURAL SPACE W/IMG GUIDE  01/03/2022   JOINT REPLACEMENT Bilateral    REPLACEMENT TOTAL KNEE BILATERAL  2015   TONSILLECTOMY  1960   TRANSTHORACIC  ECHOCARDIOGRAM  05/2020   a) 05/2020: EF 55 to 60%.  No R WMA.  Mild LVH.  Indeterminate LVEDP.  Unable to assess RVP.  Normal aortic and mitral valves.  Mildly elevated RAP.; b) 09/2021: EF 50-55%. No RWMA. Mild LVH. ~ LVEDP. Mild LA Dilation. NORMAL RV/RAP.  Normal MV/AoV.    Family History  Problem Relation Age of Onset   Heart attack Mother    Cancer Mother    Hypertension Mother    Arthritis Father    Asthma Father    Cancer Father    COPD Father    Heart attack Father    Hypertension Sister    Cancer Sister    Diabetes Sister    Asthma Sister    Cancer Sister    COPD Sister    Arthritis Sister    Asthma Sister    Diabetes Sister    Birth defects Maternal Grandfather    Arthritis Paternal Grandmother    Diabetes Paternal Grandmother    Arthritis Paternal Grandfather    Asthma Son    Other Neg Hx        pituitary disorder    Allergies  Allergen Reactions   Bupropion     Racing heart    Hydrochlorothiazide Other (See Comments)    Cramping to lower extremities    Current Outpatient Medications on File Prior to Visit  Medication Sig Dispense Refill   albuterol (VENTOLIN HFA) 108 (90 Base) MCG/ACT inhaler Inhale 2 puffs into the lungs every 6 (six) hours as needed for wheezing or shortness of breath. 18 g 0   amLODipine (NORVASC) 10 MG tablet TAKE 1 TABLET BY MOUTH EVERY DAY FOR BLOOD PRESSURE 90 tablet 3   B-D 3CC LUER-LOK SYR 22GX1" 22G X 1" 3 ML MISC USE AS INSTRUCTED FOR TESTOSTERONE INJECTION EVERY 2 WEEKS 10 each 2   bisoprolol (ZEBETA) 5 MG tablet TAKE 1 TABLET (5 MG TOTAL) BY MOUTH DAILY. 90 tablet 2   Black Pepper-Turmeric (TURMERIC COMPLEX/BLACK PEPPER PO) Take 1 tablet by mouth in the morning and at bedtime.     blood glucose meter kit and supplies KIT Dispense based on patient and insurance preference. Use up to four times daily as directed. 1 each 0   chlorpheniramine-HYDROcodone (TUSSIONEX) 10-8 MG/5ML SUER Take 5 mLs by mouth at bedtime as needed for cough. 473 mL 0   ELIQUIS 5 MG TABS tablet TAKE 1 TABLET BY MOUTH TWICE A DAY 180 tablet 2   esomeprazole (NEXIUM) 20 MG capsule Take 20 mg by mouth in the morning.     gabapentin (NEURONTIN) 300 MG capsule Take 1 capsule (300 mg total) by mouth 2 (two) times daily. For back pain. 180 capsule 1   glipiZIDE (GLUCOTROL) 10 MG tablet TAKE 1 TABLET (10 MG TOTAL) BY MOUTH 2 (TWO) TIMES DAILY BEFORE A MEAL. FOR DIABETES. 180 tablet 3   Insulin Pen Needle (BD PEN NEEDLE NANO U/F) 32G X 4 MM MISC Use with insulin as prescribed Dx Code: E11.9 200 each 3   ipratropium-albuterol (DUONEB) 0.5-2.5 (3) MG/3ML SOLN Take 3 mLs by nebulization every 4 (four) hours while awake for 3 days, THEN 3 mLs every 4 (four) hours as needed (shortness of breath or wheezing). 360 mL 0   Lancets MISC USE UP TO 3 TIMES DAILY AS DIRECTED 100 each 2   losartan (COZAAR) 50 MG tablet Take 1 tablet (50 mg total) by mouth daily. For blood pressure. 90 tablet  3   Melatonin 10  MG CAPS Take 10 mg by mouth at bedtime.     metFORMIN (GLUCOPHAGE) 1000 MG tablet TAKE 1 TABLET (1,000 MG TOTAL) BY MOUTH 2 (TWO) TIMES DAILY WITH A MEAL. FOR DIABETES 180 tablet 1   Multiple Vitamin (MULTI-VITAMINS) TABS Take 1 tablet by mouth in the morning.     Omega-3 Fatty Acids (SALMON OIL PO) Take 1 capsule by mouth daily.     ONETOUCH ULTRA test strip USE UP TO 4 TIMES DAILY AS DIRECTED 100 strip 5   oxymetazoline (AFRIN) 0.05 % nasal spray Place 1 spray into both nostrils daily.     pioglitazone (ACTOS) 45 MG tablet TAKE 1 TABLET (45 MG TOTAL) BY MOUTH DAILY. FOR DIABETES. 90 tablet 1   pravastatin (PRAVACHOL) 40 MG tablet TAKE 1 TABLET BY MOUTH EVERY DAY IN THE EVENING FOR CHOLESTEROL 90 tablet 1   testosterone cypionate (DEPOTESTOSTERONE CYPIONATE) 200 MG/ML injection Inject 200 mg into the muscle every 14 (fourteen) days.     No current facility-administered medications on file prior to visit.    BP 124/70   Pulse 85   Temp (!) 97.5 F (36.4 C) (Temporal)   Ht 5' 8.5" (1.74 m)   Wt 290 lb (131.5 kg)   SpO2 96%   BMI 43.45 kg/m  Objective:   Physical Exam Cardiovascular:     Rate and Rhythm: Normal rate and regular rhythm.  Pulmonary:     Effort: Pulmonary effort is normal.     Breath sounds: Normal breath sounds. No wheezing or rales.  Abdominal:     General: Bowel sounds are normal.     Palpations: Abdomen is soft.     Tenderness: There is no abdominal tenderness.  Musculoskeletal:     Cervical back: Neck supple.  Skin:    General: Skin is warm and dry.  Neurological:     Mental Status: He is alert and oriented to person, place, and time.  Psychiatric:        Mood and Affect: Mood normal.           Assessment & Plan:   Problem List Items Addressed This Visit       Cardiovascular and Mediastinum   Essential hypertension (Chronic)    Controlled.  Continue amlodipine 10 mg daily, bisoprolol 5 mg daily, losartan 50 mg daily. CMP  reviewed from July 2023.        Respiratory   OSA (obstructive sleep apnea) (Chronic)    Following with pulmonology, continue CPAP machine nightly.      Small cell lung cancer, right upper lobe (HCC) (Chronic)    Following with oncology, medical oncology, pulmonology.  Oncology and pulmonology notes reviewed from June and July 2023.  Anticipate continued hyperglycemia since he has resumed chemotherapy. We will adjust his insulin regimen accordingly.      Recurrent pleural effusion on right    Following with pulmonology. Chest x-ray from June 2023 reviewed.        Endocrine   Type 2 diabetes mellitus ([place]) - Primary (Chronic)    Worse since last visit which is largely due to resuming chemotherapy.  Increase Basaglar to 24 units nightly. Continue metformin 1000 mg twice daily, Actos 30 mg daily, glipizide 10 mg twice daily.  He will update in 2 weeks if glucose readings are not improving.  Follow-up in 3 months.      Relevant Medications   Insulin Glargine (BASAGLAR KWIKPEN) 100 UNIT/ML   Other Relevant Orders   POCT glycosylated hemoglobin (Hb A1C) (  Completed)   Hyperlipidemia associated with type 2 diabetes mellitus (HCC) (Chronic)    Repeat lipid panel pending. Continue pravastatin 40 mg daily.      Relevant Medications   Insulin Glargine (BASAGLAR KWIKPEN) 100 UNIT/ML     Other   Chronic bilateral low back pain without sciatica    Overall controlled.  Continue gabapentin 300 mg twice daily.      History of pulmonary embolism    Continue Eliquis 5 mg twice daily.      Other Visit Diagnoses     Hyperlipidemia, unspecified hyperlipidemia type       Relevant Orders   Lipid panel   Screening for prostate cancer       Relevant Orders   PSA, Medicare          [name], NP

## 2022-06-24 NOTE — Assessment & Plan Note (Signed)
Following with pulmonology. Chest x-ray from June 2023 reviewed.

## 2022-06-24 NOTE — Assessment & Plan Note (Signed)
Worse since last visit which is largely due to resuming chemotherapy.  Increase Basaglar to 24 units nightly. Continue metformin 1000 mg twice daily, Actos 30 mg daily, glipizide 10 mg twice daily.  He will update in 2 weeks if glucose readings are not improving.  Follow-up in 3 months.

## 2022-06-24 NOTE — Assessment & Plan Note (Signed)
Repeat lipid panel pending. Continue pravastatin 40 mg daily.

## 2022-06-24 NOTE — Assessment & Plan Note (Signed)
Continue Eliquis 5 mg twice daily.

## 2022-06-25 ENCOUNTER — Other Ambulatory Visit: Payer: Self-pay

## 2022-06-25 ENCOUNTER — Inpatient Hospital Stay: Payer: Medicare Other

## 2022-06-25 DIAGNOSIS — C3411 Malignant neoplasm of upper lobe, right bronchus or lung: Secondary | ICD-10-CM

## 2022-06-25 DIAGNOSIS — Z5112 Encounter for antineoplastic immunotherapy: Secondary | ICD-10-CM | POA: Diagnosis not present

## 2022-06-25 DIAGNOSIS — Z5111 Encounter for antineoplastic chemotherapy: Secondary | ICD-10-CM | POA: Diagnosis not present

## 2022-06-25 DIAGNOSIS — C787 Secondary malignant neoplasm of liver and intrahepatic bile duct: Secondary | ICD-10-CM | POA: Diagnosis not present

## 2022-06-25 LAB — CMP (CANCER CENTER ONLY)
ALT: 23 U/L (ref 0–44)
AST: 22 U/L (ref 15–41)
Albumin: 3.6 g/dL (ref 3.5–5.0)
Alkaline Phosphatase: 60 U/L (ref 38–126)
Anion gap: 8 (ref 5–15)
BUN: 20 mg/dL (ref 8–23)
CO2: 26 mmol/L (ref 22–32)
Calcium: 9.1 mg/dL (ref 8.9–10.3)
Chloride: 107 mmol/L (ref 98–111)
Creatinine: 1.1 mg/dL (ref 0.61–1.24)
GFR, Estimated: >60 mL/min (ref >60)
Glucose, Bld: 180 mg/dL — ABNORMAL HIGH (ref 70–99)
Potassium: 4.8 mmol/L (ref 3.5–5.1)
Sodium: 141 mmol/L (ref 135–145)
Total Bilirubin: 0.6 mg/dL (ref 0.3–1.2)
Total Protein: 6.5 g/dL (ref 6.5–8.1)

## 2022-06-25 LAB — CBC WITH DIFFERENTIAL (CANCER CENTER ONLY)
Abs Immature Granulocytes: 0.06 10*3/uL (ref 0.00–0.07)
Basophils Absolute: 0 10*3/uL (ref 0.0–0.1)
Basophils Relative: 1 %
Eosinophils Absolute: 0 10*3/uL (ref 0.0–0.5)
Eosinophils Relative: 1 %
HCT: 34.1 % — ABNORMAL LOW (ref 39.0–52.0)
Hemoglobin: 11 g/dL — ABNORMAL LOW (ref 13.0–17.0)
Immature Granulocytes: 2 %
Lymphocytes Relative: 17 %
Lymphs Abs: 0.5 10*3/uL — ABNORMAL LOW (ref 0.7–4.0)
MCH: 28.8 pg (ref 26.0–34.0)
MCHC: 32.3 g/dL (ref 30.0–36.0)
MCV: 89.3 fL (ref 80.0–100.0)
Monocytes Absolute: 0.1 10*3/uL (ref 0.1–1.0)
Monocytes Relative: 4 %
Neutro Abs: 2.2 10*3/uL (ref 1.7–7.7)
Neutrophils Relative %: 75 %
Platelet Count: 191 10*3/uL (ref 150–400)
RBC: 3.82 MIL/uL — ABNORMAL LOW (ref 4.22–5.81)
RDW: 15.1 % (ref 11.5–15.5)
WBC Count: 2.9 10*3/uL — ABNORMAL LOW (ref 4.0–10.5)
nRBC: 0 % (ref 0.0–0.2)

## 2022-07-02 ENCOUNTER — Inpatient Hospital Stay: Payer: Medicare Other

## 2022-07-02 ENCOUNTER — Ambulatory Visit: Payer: Medicare Other | Admitting: Physician Assistant

## 2022-07-02 ENCOUNTER — Other Ambulatory Visit: Payer: Medicare Other

## 2022-07-02 ENCOUNTER — Other Ambulatory Visit: Payer: Self-pay

## 2022-07-02 ENCOUNTER — Ambulatory Visit: Payer: Medicare Other

## 2022-07-02 DIAGNOSIS — C787 Secondary malignant neoplasm of liver and intrahepatic bile duct: Secondary | ICD-10-CM | POA: Diagnosis not present

## 2022-07-02 DIAGNOSIS — C3411 Malignant neoplasm of upper lobe, right bronchus or lung: Secondary | ICD-10-CM

## 2022-07-02 DIAGNOSIS — Z5111 Encounter for antineoplastic chemotherapy: Secondary | ICD-10-CM | POA: Diagnosis not present

## 2022-07-02 DIAGNOSIS — Z5112 Encounter for antineoplastic immunotherapy: Secondary | ICD-10-CM | POA: Diagnosis not present

## 2022-07-02 LAB — CMP (CANCER CENTER ONLY)
ALT: 19 U/L (ref 0–44)
AST: 18 U/L (ref 15–41)
Albumin: 4 g/dL (ref 3.5–5.0)
Alkaline Phosphatase: 77 U/L (ref 38–126)
Anion gap: 7 (ref 5–15)
BUN: 15 mg/dL (ref 8–23)
CO2: 29 mmol/L (ref 22–32)
Calcium: 8.9 mg/dL (ref 8.9–10.3)
Chloride: 103 mmol/L (ref 98–111)
Creatinine: 1.02 mg/dL (ref 0.61–1.24)
GFR, Estimated: >60 mL/min (ref >60)
Glucose, Bld: 141 mg/dL — ABNORMAL HIGH (ref 70–99)
Potassium: 4 mmol/L (ref 3.5–5.1)
Sodium: 139 mmol/L (ref 135–145)
Total Bilirubin: 0.4 mg/dL (ref 0.3–1.2)
Total Protein: 6.4 g/dL — ABNORMAL LOW (ref 6.5–8.1)

## 2022-07-02 LAB — CBC WITH DIFFERENTIAL (CANCER CENTER ONLY)
Abs Immature Granulocytes: 0.02 10*3/uL (ref 0.00–0.07)
Basophils Absolute: 0 10*3/uL (ref 0.0–0.1)
Basophils Relative: 2 %
Eosinophils Absolute: 0 10*3/uL (ref 0.0–0.5)
Eosinophils Relative: 1 %
HCT: 32.9 % — ABNORMAL LOW (ref 39.0–52.0)
Hemoglobin: 10.4 g/dL — ABNORMAL LOW (ref 13.0–17.0)
Immature Granulocytes: 1 %
Lymphocytes Relative: 28 %
Lymphs Abs: 0.5 10*3/uL — ABNORMAL LOW (ref 0.7–4.0)
MCH: 28.3 pg (ref 26.0–34.0)
MCHC: 31.6 g/dL (ref 30.0–36.0)
MCV: 89.6 fL (ref 80.0–100.0)
Monocytes Absolute: 0.5 10*3/uL (ref 0.1–1.0)
Monocytes Relative: 25 %
Neutro Abs: 0.8 10*3/uL — ABNORMAL LOW (ref 1.7–7.7)
Neutrophils Relative %: 43 %
Platelet Count: 71 10*3/uL — ABNORMAL LOW (ref 150–400)
RBC: 3.67 MIL/uL — ABNORMAL LOW (ref 4.22–5.81)
RDW: 16.4 % — ABNORMAL HIGH (ref 11.5–15.5)
WBC Count: 1.8 10*3/uL — ABNORMAL LOW (ref 4.0–10.5)
nRBC: 0 % (ref 0.0–0.2)

## 2022-07-03 ENCOUNTER — Ambulatory Visit: Payer: Medicare Other

## 2022-07-04 ENCOUNTER — Ambulatory Visit: Payer: Medicare Other

## 2022-07-04 ENCOUNTER — Ambulatory Visit (HOSPITAL_COMMUNITY)
Admission: RE | Admit: 2022-07-04 | Discharge: 2022-07-04 | Disposition: A | Discharge status: Home / Self Care | Payer: Medicare Other | Source: Ambulatory Visit | Attending: Physician Assistant | Admitting: Physician Assistant

## 2022-07-04 DIAGNOSIS — J90 Pleural effusion, not elsewhere classified: Secondary | ICD-10-CM | POA: Diagnosis not present

## 2022-07-04 DIAGNOSIS — C3411 Malignant neoplasm of upper lobe, right bronchus or lung: Secondary | ICD-10-CM | POA: Diagnosis not present

## 2022-07-04 DIAGNOSIS — J479 Bronchiectasis, uncomplicated: Secondary | ICD-10-CM | POA: Diagnosis not present

## 2022-07-04 DIAGNOSIS — N281 Cyst of kidney, acquired: Secondary | ICD-10-CM | POA: Diagnosis not present

## 2022-07-04 DIAGNOSIS — K769 Liver disease, unspecified: Secondary | ICD-10-CM | POA: Diagnosis not present

## 2022-07-04 DIAGNOSIS — C787 Secondary malignant neoplasm of liver and intrahepatic bile duct: Secondary | ICD-10-CM | POA: Diagnosis not present

## 2022-07-04 DIAGNOSIS — C3490 Malignant neoplasm of unspecified part of unspecified bronchus or lung: Secondary | ICD-10-CM | POA: Diagnosis not present

## 2022-07-04 MED ORDER — SODIUM CHLORIDE (PF) 0.9 % IJ SOLN
INTRAMUSCULAR | Status: AC
  Filled 2022-07-04: qty 50

## 2022-07-04 MED ORDER — IOHEXOL 300 MG/ML  SOLN
100.0000 mL | Freq: Once | INTRAMUSCULAR | Status: AC | PRN
  Administered 2022-07-04: 100 mL via INTRAVENOUS

## 2022-07-04 MED ORDER — HEPARIN SOD (PORK) LOCK FLUSH 100 UNIT/ML IV SOLN
INTRAVENOUS | Status: AC
  Administered 2022-07-04: 500 [IU]
  Filled 2022-07-04: qty 5

## 2022-07-07 ENCOUNTER — Other Ambulatory Visit: Payer: Self-pay

## 2022-07-09 ENCOUNTER — Encounter: Payer: Self-pay | Admitting: Internal Medicine

## 2022-07-09 ENCOUNTER — Inpatient Hospital Stay: Payer: Medicare Other

## 2022-07-09 ENCOUNTER — Other Ambulatory Visit: Payer: Self-pay

## 2022-07-09 ENCOUNTER — Inpatient Hospital Stay (HOSPITAL_BASED_OUTPATIENT_CLINIC_OR_DEPARTMENT_OTHER): Payer: Medicare Other | Admitting: Internal Medicine

## 2022-07-09 VITALS — BP 134/78 | HR 87 | Temp 97.5°F | Resp 17 | Wt 296.1 lb

## 2022-07-09 DIAGNOSIS — C3411 Malignant neoplasm of upper lobe, right bronchus or lung: Secondary | ICD-10-CM

## 2022-07-09 DIAGNOSIS — Z95828 Presence of other vascular implants and grafts: Secondary | ICD-10-CM

## 2022-07-09 DIAGNOSIS — Z5112 Encounter for antineoplastic immunotherapy: Secondary | ICD-10-CM | POA: Diagnosis not present

## 2022-07-09 DIAGNOSIS — Z5111 Encounter for antineoplastic chemotherapy: Secondary | ICD-10-CM

## 2022-07-09 DIAGNOSIS — C787 Secondary malignant neoplasm of liver and intrahepatic bile duct: Secondary | ICD-10-CM | POA: Diagnosis not present

## 2022-07-09 LAB — CBC WITH DIFFERENTIAL (CANCER CENTER ONLY)
Abs Immature Granulocytes: 0.03 10*3/uL (ref 0.00–0.07)
Basophils Absolute: 0 10*3/uL (ref 0.0–0.1)
Basophils Relative: 1 %
Eosinophils Absolute: 0.1 10*3/uL (ref 0.0–0.5)
Eosinophils Relative: 3 %
HCT: 31.3 % — ABNORMAL LOW (ref 39.0–52.0)
Hemoglobin: 10.2 g/dL — ABNORMAL LOW (ref 13.0–17.0)
Immature Granulocytes: 1 %
Lymphocytes Relative: 18 %
Lymphs Abs: 0.5 10*3/uL — ABNORMAL LOW (ref 0.7–4.0)
MCH: 29.5 pg (ref 26.0–34.0)
MCHC: 32.6 g/dL (ref 30.0–36.0)
MCV: 90.5 fL (ref 80.0–100.0)
Monocytes Absolute: 0.6 10*3/uL (ref 0.1–1.0)
Monocytes Relative: 20 %
Neutro Abs: 1.8 10*3/uL (ref 1.7–7.7)
Neutrophils Relative %: 57 %
Platelet Count: 231 10*3/uL (ref 150–400)
RBC: 3.46 MIL/uL — ABNORMAL LOW (ref 4.22–5.81)
RDW: 17.1 % — ABNORMAL HIGH (ref 11.5–15.5)
WBC Count: 3.1 10*3/uL — ABNORMAL LOW (ref 4.0–10.5)
nRBC: 0 % (ref 0.0–0.2)

## 2022-07-09 LAB — CMP (CANCER CENTER ONLY)
ALT: 16 U/L (ref 0–44)
AST: 16 U/L (ref 15–41)
Albumin: 3.8 g/dL (ref 3.5–5.0)
Alkaline Phosphatase: 74 U/L (ref 38–126)
Anion gap: 7 (ref 5–15)
BUN: 12 mg/dL (ref 8–23)
CO2: 28 mmol/L (ref 22–32)
Calcium: 8.5 mg/dL — ABNORMAL LOW (ref 8.9–10.3)
Chloride: 106 mmol/L (ref 98–111)
Creatinine: 1.01 mg/dL (ref 0.61–1.24)
GFR, Estimated: >60 mL/min (ref >60)
Glucose, Bld: 172 mg/dL — ABNORMAL HIGH (ref 70–99)
Potassium: 4.1 mmol/L (ref 3.5–5.1)
Sodium: 141 mmol/L (ref 135–145)
Total Bilirubin: 0.3 mg/dL (ref 0.3–1.2)
Total Protein: 6.2 g/dL — ABNORMAL LOW (ref 6.5–8.1)

## 2022-07-09 MED ORDER — SODIUM CHLORIDE 0.9% FLUSH
10.0000 mL | Freq: Once | INTRAVENOUS | Status: AC
  Administered 2022-07-09: 10 mL

## 2022-07-09 MED ORDER — SODIUM CHLORIDE 0.9 % IV SOLN
150.0000 mg | Freq: Once | INTRAVENOUS | Status: AC
  Administered 2022-07-09: 150 mg via INTRAVENOUS
  Filled 2022-07-09: qty 150

## 2022-07-09 MED ORDER — FAMOTIDINE IN NACL 20-0.9 MG/50ML-% IV SOLN
20.0000 mg | Freq: Once | INTRAVENOUS | Status: AC
  Administered 2022-07-09: 20 mg via INTRAVENOUS
  Filled 2022-07-09: qty 50

## 2022-07-09 MED ORDER — PALONOSETRON HCL INJECTION 0.25 MG/5ML
0.2500 mg | Freq: Once | INTRAVENOUS | Status: AC
  Administered 2022-07-09: 0.25 mg via INTRAVENOUS
  Filled 2022-07-09: qty 5

## 2022-07-09 MED ORDER — SODIUM CHLORIDE 0.9 % IV SOLN
682.5000 mg | Freq: Once | INTRAVENOUS | Status: AC
  Administered 2022-07-09: 680 mg via INTRAVENOUS
  Filled 2022-07-09: qty 68

## 2022-07-09 MED ORDER — SODIUM CHLORIDE 0.9 % IV SOLN
100.0000 mg/m2 | Freq: Once | INTRAVENOUS | Status: AC
  Administered 2022-07-09: 260 mg via INTRAVENOUS
  Filled 2022-07-09: qty 13

## 2022-07-09 MED ORDER — TRILACICLIB DIHYDROCHLORIDE INJECTION 300 MG
240.0000 mg/m2 | Freq: Once | INTRAVENOUS | Status: AC
  Administered 2022-07-09: 600 mg via INTRAVENOUS
  Filled 2022-07-09: qty 40

## 2022-07-09 MED ORDER — SODIUM CHLORIDE 0.9 % IV SOLN: Freq: Once | INTRAVENOUS | Status: AC

## 2022-07-09 MED ORDER — SODIUM CHLORIDE 0.9 % IV SOLN
10.0000 mg | Freq: Once | INTRAVENOUS | Status: AC
  Administered 2022-07-09: 10 mg via INTRAVENOUS
  Filled 2022-07-09: qty 10

## 2022-07-09 MED ORDER — CETIRIZINE HCL 10 MG/ML IV SOLN
10.0000 mg | Freq: Once | INTRAVENOUS | Status: AC
  Administered 2022-07-09: 10 mg via INTRAVENOUS
  Filled 2022-07-09: qty 1

## 2022-07-09 MED ORDER — SODIUM CHLORIDE 0.9% FLUSH
10.0000 mL | INTRAVENOUS | Status: DC | PRN
  Administered 2022-07-09: 10 mL

## 2022-07-09 MED ORDER — HEPARIN SOD (PORK) LOCK FLUSH 100 UNIT/ML IV SOLN
500.0000 [IU] | Freq: Once | INTRAVENOUS | Status: AC | PRN
  Administered 2022-07-09: 500 [IU]

## 2022-07-09 NOTE — Progress Notes (Signed)
Poor tol of Benadryl; oversedation.  Ok to switch to IV Certirizine per Dr. Julien Nordmann.  Kennith Center, Pharm.D., CPP 07/09/2022@9 :46 AM

## 2022-07-09 NOTE — Patient Instructions (Signed)
[facility]  Discharge Instructions: Thank you for choosing [facility] to provide your oncology and hematology care.   If you have a lab appointment with the [facility], please go directly to the [facility] and check in at the registration area.   Wear comfortable clothing and clothing appropriate for easy access to any Portacath or PICC line.   We strive to give you quality time with your provider. You may need to reschedule your appointment if you arrive late (15 or more minutes).  Arriving late affects you and other patients whose appointments are after yours.  Also, if you miss three or more appointments without notifying the office, you may be dismissed from the clinic at the provider's discretion.      For prescription refill requests, have your pharmacy contact our office and allow 72 hours for refills to be completed.    Today you received the following chemotherapy and/or immunotherapy agents carboplatin, etoposide      To help prevent nausea and vomiting after your treatment, we encourage you to take your nausea medication as directed.  BELOW ARE SYMPTOMS THAT SHOULD BE REPORTED IMMEDIATELY: *FEVER GREATER THAN 100.4 F (38 C) OR HIGHER *CHILLS OR SWEATING *NAUSEA AND VOMITING THAT IS NOT CONTROLLED WITH YOUR NAUSEA MEDICATION *UNUSUAL SHORTNESS OF BREATH *UNUSUAL BRUISING OR BLEEDING *URINARY PROBLEMS (pain or burning when urinating, or frequent urination) *BOWEL PROBLEMS (unusual diarrhea, constipation, pain near the anus) TENDERNESS IN MOUTH AND THROAT WITH OR WITHOUT PRESENCE OF ULCERS (sore throat, sores in mouth, or a toothache) UNUSUAL RASH, SWELLING OR PAIN  UNUSUAL VAGINAL DISCHARGE OR ITCHING   Items with * indicate a potential emergency and should be followed up as soon as possible or go to the Emergency Department if any problems should occur.  Please show the CHEMOTHERAPY ALERT CARD or IMMUNOTHERAPY ALERT CARD at  check-in to the Emergency Department and triage nurse.  Should you have questions after your visit or need to cancel or reschedule your appointment, please contact [facility]  Dept: [phone]  and follow the prompts.  Office hours are 8:00 a.m. to 4:30 p.m. Monday - Friday. Please note that voicemails left after 4:00 p.m. may not be returned until the following business day.  We are closed weekends and major holidays. You have access to a nurse at all times for urgent questions. Please call the main number to the clinic Dept: [phone] and follow the prompts.   For any non-urgent questions, you may also contact your provider using MyChart. We now offer e-Visits for anyone 8 and older to request care online for non-urgent symptoms. For details visit [email or web address].   Also download the MyChart app! Go to the app store, search "MyChart", open the app, select [facility], and log in with your MyChart username and password.  Masks are optional in the cancer centers. If you would like for your care team to wear a mask while they are taking care of you, please let them know. For doctor visits, patients may have with them one support person who is at least 71 years old. At this time, visitors are not allowed in the infusion area.

## 2022-07-09 NOTE — Progress Notes (Signed)
[place] [phone]   [phone]  OFFICE PROGRESS NOTE  [name], NP [place] [address]  DIAGNOSIS: Relapsed extensive stage (T4, N3, M1c)  small cell lung cancer diagnosed in April 2021 and presented with large right upper lobe/right hilar mass with ipsilateral and contralateral mediastinal and right supraclavicular lymphadenopathy in addition to multiple liver lesions. He has disease progression after the first line of chemotherapy in December 2021.  PRIOR THERAPY: 1) Palliative radiotherapy to the right upper lobe lung mass under the care of Dr. Lisbeth Renshaw. 2) Systemic chemotherapy with carboplatin for AUC of 5 on day 1, etoposide 100 mg/M2 on days 1, 2 and 3 in addition to Imfinzi 1500 mg IV every 3 weeks with chemotherapy then every 4 weeks for maintenance if the patient has no evidence for progression.  He will also receive Cosela 240 mg/m2 on the days of the chemotherapy.  Status post 9 cycles.  Starting from cycle #5 the patient will be on maintenance treatment with immunotherapy with Imfinzi 1500 mg IV every 4 weeks. Last dose of chemotherapy was given on November 13, 2020. This treatment was discontinued secondary to disease progression 3) Systemic chemotherapy with carboplatin for AUC of 5 on day 1, etoposide 100 mg/M2 on days 1, 2 and 3, Tecentriq 1200 mg IV every 3 weeks as well as Cosela 250 mg/M2 on the days of the chemotherapy every 3 weeks.  First dose December 18, 2020.  Status post 8 cycles. 4) Zepzelca (lurbinectedin) 3.2 mgm2 IV every 3 weeks. Last dose on 01/23/22. Status post 12 cycles.  5) Palliative systemic chemotherapy with irinotecan 65 mg/m2 on days 1 and 8 IV every 3 weeks.  Status post 3 cycles.  Last dose was given April 01, 2022 discontinued secondary to disease progression   CURRENT THERAPY: systemic chemotherapy with carboplatin for AUC of 5 on day 1, etoposide 100 Mg/M2 on days 1, 2 and 3 with  Cosela before the chemotherapy.  First dose expected to start on 05/28/2022.  Status post 2 cycles.  INTERVAL HISTORY: Adam Wise 71 y.o. male returns to the clinic today for follow-up visit accompanied by his wife.  The patient is feeling fine today with no concerning complaints except for mild fatigue and shortness of breath with exertion.  He denied having any current chest pain, cough or hemoptysis.  He has no nausea, vomiting, diarrhea or constipation.  He has no headache or visual changes.  He has no significant weight loss or night sweats.  He is tolerating his current systemic chemotherapy with carboplatin and etoposide fairly well.  The patient had repeat CT scan of the chest, abdomen and pelvis performed recently and he is here for evaluation and discussion of his scan results.    MEDICAL HISTORY: Past Medical History:  Diagnosis Date   Chickenpox    Chronic knee pain    Chronic low back pain    COPD exacerbation ([place]) 10/11/2021   COPD with exacerbation ([place]) 10/12/2021   Coronary artery calcification seen on CAT scan 11/2021   Coronary CTA 11/27/2021: Moderate to large right pleural effusion and compressive atelectasis right lung base. => Coronary Calcium Score 657.  Diffuse RCA calcification.  Minimal mild disease in the LAD and diagonal branches. == Overall limited study.  Notable artifact.   Essential hypertension    GERD (gastroesophageal reflux disease)    Hyperlipidemia    Malignant neoplasm of upper lobe of right lung (  [place]) 04/04/2020   OSA (obstructive sleep apnea)    With nighttime oxygen supplementation   T4, M3, M1 C Metastatic Small Cell Lung Cancer 03/2020   large right upper lobe/right hilar mass with ipsilateral and contralateral mediastinal and right supraclavicular lymphadenopathy in addition to multiple liver lesios. He has disease progression after the first line of chemotherapy in December 2021.   Testosterone deficiency    Type 2 diabetes mellitus (HCC)      ALLERGIES:  is allergic to bupropion and hydrochlorothiazide.  MEDICATIONS:  Current Outpatient Medications  Medication Sig Dispense Refill   albuterol (VENTOLIN HFA) 108 (90 Base) MCG/ACT inhaler Inhale 2 puffs into the lungs every 6 (six) hours as needed for wheezing or shortness of breath. 18 g 0   amLODipine (NORVASC) 10 MG tablet TAKE 1 TABLET BY MOUTH EVERY DAY FOR BLOOD PRESSURE 90 tablet 3   B-D 3CC LUER-LOK SYR 22GX1" 22G X 1" 3 ML MISC USE AS INSTRUCTED FOR TESTOSTERONE INJECTION EVERY 2 WEEKS 10 each 2   bisoprolol (ZEBETA) 5 MG tablet TAKE 1 TABLET (5 MG TOTAL) BY MOUTH DAILY. 90 tablet 2   Black Pepper-Turmeric (TURMERIC COMPLEX/BLACK PEPPER PO) Take 1 tablet by mouth in the morning and at bedtime.     blood glucose meter kit and supplies KIT Dispense based on patient and insurance preference. Use up to four times daily as directed. 1 each 0   chlorpheniramine-HYDROcodone (TUSSIONEX) 10-8 MG/5ML SUER Take 5 mLs by mouth at bedtime as needed for cough. 473 mL 0   ELIQUIS 5 MG TABS tablet TAKE 1 TABLET BY MOUTH TWICE A DAY 180 tablet 2   esomeprazole (NEXIUM) 20 MG capsule Take 20 mg by mouth in the morning.     gabapentin (NEURONTIN) 300 MG capsule Take 1 capsule (300 mg total) by mouth 2 (two) times daily. For back pain. 180 capsule 1   glipiZIDE (GLUCOTROL) 10 MG tablet TAKE 1 TABLET (10 MG TOTAL) BY MOUTH 2 (TWO) TIMES DAILY BEFORE A MEAL. FOR DIABETES. 180 tablet 3   Insulin Glargine (BASAGLAR KWIKPEN) 100 UNIT/ML Inject 24 Units into the skin at bedtime. For diabetes. 30 mL 1   Insulin Pen Needle (BD PEN NEEDLE NANO U/F) 32G X 4 MM MISC Use with insulin as prescribed Dx Code: E11.9 200 each 3   ipratropium-albuterol (DUONEB) 0.5-2.5 (3) MG/3ML SOLN Take 3 mLs by nebulization every 4 (four) hours while awake for 3 days, THEN 3 mLs every 4 (four) hours as needed (shortness of breath or wheezing). 360 mL 0   Lancets MISC USE UP TO 3 TIMES DAILY AS DIRECTED 100 each 2    losartan (COZAAR) 50 MG tablet Take 1 tablet (50 mg total) by mouth daily. For blood pressure. 90 tablet 3   Melatonin 10 MG CAPS Take 10 mg by mouth at bedtime.     metFORMIN (GLUCOPHAGE) 1000 MG tablet TAKE 1 TABLET (1,000 MG TOTAL) BY MOUTH 2 (TWO) TIMES DAILY WITH A MEAL. FOR DIABETES 180 tablet 1   Multiple Vitamin (MULTI-VITAMINS) TABS Take 1 tablet by mouth in the morning.     Omega-3 Fatty Acids (SALMON OIL PO) Take 1 capsule by mouth daily.     ONETOUCH ULTRA test strip USE UP TO 4 TIMES DAILY AS DIRECTED 100 strip 5   oxymetazoline (AFRIN) 0.05 % nasal spray Place 1 spray into both nostrils daily.     pioglitazone (ACTOS) 45 MG tablet TAKE 1 TABLET (45 MG TOTAL) BY MOUTH DAILY. FOR DIABETES.  90 tablet 1   pravastatin (PRAVACHOL) 40 MG tablet TAKE 1 TABLET BY MOUTH EVERY DAY IN THE EVENING FOR CHOLESTEROL 90 tablet 1   testosterone cypionate (DEPOTESTOSTERONE CYPIONATE) 200 MG/ML injection Inject 200 mg into the muscle every 14 (fourteen) days.     No current facility-administered medications for this visit.    SURGICAL HISTORY:  Past Surgical History:  Procedure Laterality Date   CHEST TUBE INSERTION Right 01/20/2022   Procedure: CHEST TUBE INSERTION;  Surgeon: Candee Furbish, MD;  Location: [facility] ENDOSCOPY;  Service: Pulmonary;  Laterality: Right;  w/ Talc Pleurodesis, planned admit for Obs afterwards   CHEST TUBE INSERTION Right 04/28/2022   Procedure: INSERTION PLEURAL DRAINAGE CATHETER - Pigtail tail, drainage;  Surgeon: Candee Furbish, MD;  Location: [facility] ENDOSCOPY;  Service: Pulmonary;  Laterality: Right;  talc pleurodesis   COLONOSCOPY WITH PROPOFOL N/A 12/17/2018   Procedure: COLONOSCOPY WITH PROPOFOL;  Surgeon: Virgel Manifold, MD;  Location: [facility] ENDOSCOPY;  Service: Gastroenterology;  Laterality: N/A;   IR IMAGING GUIDED PORT INSERTION  04/17/2020   IR THORACENTESIS ASP PLEURAL SPACE W/IMG GUIDE  11/29/2021   IR THORACENTESIS ASP PLEURAL SPACE W/IMG GUIDE  01/03/2022    JOINT REPLACEMENT Bilateral    REPLACEMENT TOTAL KNEE BILATERAL  2015   TONSILLECTOMY  1960   TRANSTHORACIC ECHOCARDIOGRAM  05/2020   a) 05/2020: EF 55 to 60%.  No R WMA.  Mild LVH.  Indeterminate LVEDP.  Unable to assess RVP.  Normal aortic and mitral valves.  Mildly elevated RAP.; b) 09/2021: EF 50-55%. No RWMA. Mild LVH. ~ LVEDP. Mild LA Dilation. NORMAL RV/RAP.  Normal MV/AoV.    REVIEW OF SYSTEMS:  Constitutional: positive for fatigue Eyes: negative Ears, nose, mouth, throat, and face: negative Respiratory: positive for cough and dyspnea on exertion Cardiovascular: negative Gastrointestinal: negative Genitourinary:negative Integument/breast: negative Hematologic/lymphatic: negative Musculoskeletal:negative Neurological: negative Behavioral/Psych: negative Endocrine: negative Allergic/Immunologic: negative   PHYSICAL EXAMINATION: General appearance: alert, cooperative, fatigued, and no distress Head: Normocephalic, without obvious abnormality, atraumatic Neck: no adenopathy, no JVD, supple, symmetrical, trachea midline, and thyroid not enlarged, symmetric, no tenderness/mass/nodules Lymph nodes: Cervical, supraclavicular, and axillary nodes normal. Resp: diminished breath sounds RLL and dullness to percussion RLL Back: symmetric, no curvature. ROM normal. No CVA tenderness. Cardio: regular rate and rhythm, S1, S2 normal, no murmur, click, rub or gallop GI: soft, non-tender; bowel sounds normal; no masses,  no organomegaly Extremities: extremities normal, atraumatic, no cyanosis or edema Neurologic: Alert and oriented X 3, normal strength and tone. Normal symmetric reflexes. Normal coordination and gait  ECOG PERFORMANCE STATUS: 1 - Symptomatic but completely ambulatory  Blood pressure 134/78, pulse 87, temperature (!) 97.5 F (36.4 C), temperature source Tympanic, resp. rate 17, weight 296 lb 1 oz (134.3 kg), SpO2 94 %.   LABORATORY DATA: Lab Results  Component Value Date    WBC 3.1 (L) 07/09/2022   HGB 10.2 (L) 07/09/2022   HCT 31.3 (L) 07/09/2022   MCV 90.5 07/09/2022   PLT 231 07/09/2022      Chemistry      Component Value Date/Time   NA 139 07/02/2022 1305   K 4.0 07/02/2022 1305   CL 103 07/02/2022 1305   CO2 29 07/02/2022 1305   BUN 15 07/02/2022 1305   CREATININE 1.02 07/02/2022 1305   CREATININE 1.45 (H) 10/18/2021 1533      Component Value Date/Time   CALCIUM 8.9 07/02/2022 1305   ALKPHOS 77 07/02/2022 1305   AST 18 07/02/2022 1305   ALT  19 07/02/2022 1305   BILITOT 0.4 07/02/2022 1305       RADIOGRAPHIC STUDIES: CT Chest W Contrast  Result Date: 07/04/2022 CLINICAL DATA:  Primary Cancer Type: Lung Imaging Indication: Assess response to therapy Interval therapy since last imaging? Yes Initial Cancer Diagnosis Date: 04/09/2020; Established by: Biopsy-proven Detailed Pathology: Extensive stage small cell lung cancer. Primary Tumor location:  Right upper lobe. Metastatic liver lesions. Surgeries: No. Chemotherapy: Yes; Ongoing? Yes; Most recent administration: 06/18/2022 Immunotherapy?  Yes; Type: Imfinzi, Tecentriq; Ongoing? No Radiation therapy? Yes; Date Range: 04/12/2020 - 05/02/2020; Target: Right lung Other Cancer Therapies: Talc pleurodesis for recurrent pleural effusion on 01/20/2022. * Tracking Code: BO * EXAM: CT CHEST, ABDOMEN, AND PELVIS WITH CONTRAST TECHNIQUE: Multidetector CT imaging of the chest, abdomen and pelvis was performed following the standard protocol during bolus administration of intravenous contrast. RADIATION DOSE REDUCTION: This exam was performed according to the departmental dose-optimization program which includes automated exposure control, adjustment of the mA and/or kV according to patient size and/or use of iterative reconstruction technique. CONTRAST:  193m OMNIPAQUE IOHEXOL 300 MG/ML  SOLN COMPARISON:  Most recent CT chest, abdomen and pelvis 04/17/2022. 03/28/2020 PET-CT. FINDINGS: CT CHEST FINDINGS  Cardiovascular: Calcified and noncalcified atheromatous plaque in the thoracic aorta. No aneurysmal dilation. Normal heart size without pericardial effusion. Scattered three-vessel coronary calcifications. Central pulmonary vasculature is unremarkable on venous phase assessment. RIGHT-sided Port-A-Cath terminates at the caval to atrial junction. Mediastinum/Nodes: Esophagus grossly normal. Thoracic inlet structures are normal. No mediastinal adenopathy. No hilar adenopathy. No axillary adenopathy. No internal mammary adenopathy. Lungs/Pleura: RIGHT perihilar consolidative changes and bronchiectasis similar to previous imaging. Nodular area in the RIGHT lung apex 11 mm previously 12 mm. Moderately large RIGHT-sided pleural effusion increased in size. Dense pleural plaques in the RIGHT posterior chest may reflect changes of prior pleurodesis but do not show evidence of nodularity. LEFT chest is clear. Airways are patent with stable mild narrowing of the RIGHT mainstem bronchus. Musculoskeletal: See below for full musculoskeletal details. CT ABDOMEN PELVIS FINDINGS Hepatobiliary: RIGHT posterior hepatic lobe lesion (image 44/2) 3.8 cm previously 3.5 cm greatest axial dimension. Caudate lesion 2.5 cm previously 4.4 cm greatest axial dimension. Lesion at the boundary of hepatic subsegment IV/V along the periphery of the liver (image 56/2) 3.5 cm previously 3.3 cm greatest axial dimension. No new suspicious hepatic lesions. Portal vein is patent. No pericholecystic stranding or biliary duct dilation. Pancreas: Normal, without mass, inflammation or ductal dilatation. Spleen: Normal. Adrenals/Urinary Tract: Adrenal glands are unremarkable. Symmetric renal enhancement. No sign of hydronephrosis. No suspicious renal lesion or perinephric stranding. Urinary bladder is grossly unremarkable. Cysts of the RIGHT kidney which require no dedicated imaging follow-up are unchanged, largest in the interpolar RIGHT kidney measuring 6.9  cm. Stomach/Bowel: No acute gastrointestinal findings. No sign of bowel obstruction. Scattered colonic diverticulosis. Vascular/Lymphatic: Small periportal lymph nodes are unchanged. Aortic atherosclerosis without aneurysmal dilation. No mesenteric adenopathy. No pelvic adenopathy. Reproductive: Prostatomegaly as before. Other: No ascites.  No pneumoperitoneum. Musculoskeletal: No acute bone finding. No destructive bone process. Spinal degenerative changes. 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 IMPRESSION: 1. RIGHT perihilar consolidative changes and bronchiectasis similar to previous imaging. 2. Nodular area in the RIGHT lung apex 11 mm previously 12 mm. Attention on follow-up. 3. Increased size of RIGHT-sided pleural effusion, mildly increased with moderate to large volume and signs of prior talc pleurodesis. 4. On balance, diminished size of hepatic metastatic disease but with potential slight interval enlargement of 1 of the lesions in  the RIGHT hepatic lobe. Suggest attention on follow-up. 5. Mild periportal nodal enlargement is stable. 6. No new lesions. 7. Aortic atherosclerosis and coronary artery disease. Aortic Atherosclerosis (ICD10-I70.0). Electronically Signed   By: Zetta Bills M.D.   On: 07/04/2022 12:05   CT Abdomen Pelvis W Contrast  Result Date: 07/04/2022 CLINICAL DATA:  Primary Cancer Type: Lung Imaging Indication: Assess response to therapy Interval therapy since last imaging? Yes Initial Cancer Diagnosis Date: 04/09/2020; Established by: Biopsy-proven Detailed Pathology: Extensive stage small cell lung cancer. Primary Tumor location:  Right upper lobe. Metastatic liver lesions. Surgeries: No. Chemotherapy: Yes; Ongoing? Yes; Most recent administration: 06/18/2022 Immunotherapy?  Yes; Type: Imfinzi, Tecentriq; Ongoing? No Radiation therapy? Yes; Date Range: 04/12/2020 - 05/02/2020; Target: Right lung Other Cancer Therapies: Talc pleurodesis for recurrent pleural effusion on 01/20/2022. *  Tracking Code: BO * EXAM: CT CHEST, ABDOMEN, AND PELVIS WITH CONTRAST TECHNIQUE: Multidetector CT imaging of the chest, abdomen and pelvis was performed following the standard protocol during bolus administration of intravenous contrast. RADIATION DOSE REDUCTION: This exam was performed according to the departmental dose-optimization program which includes automated exposure control, adjustment of the mA and/or kV according to patient size and/or use of iterative reconstruction technique. CONTRAST:  140m OMNIPAQUE IOHEXOL 300 MG/ML  SOLN COMPARISON:  Most recent CT chest, abdomen and pelvis 04/17/2022. 03/28/2020 PET-CT. FINDINGS: CT CHEST FINDINGS Cardiovascular: Calcified and noncalcified atheromatous plaque in the thoracic aorta. No aneurysmal dilation. Normal heart size without pericardial effusion. Scattered three-vessel coronary calcifications. Central pulmonary vasculature is unremarkable on venous phase assessment. RIGHT-sided Port-A-Cath terminates at the caval to atrial junction. Mediastinum/Nodes: Esophagus grossly normal. Thoracic inlet structures are normal. No mediastinal adenopathy. No hilar adenopathy. No axillary adenopathy. No internal mammary adenopathy. Lungs/Pleura: RIGHT perihilar consolidative changes and bronchiectasis similar to previous imaging. Nodular area in the RIGHT lung apex 11 mm previously 12 mm. Moderately large RIGHT-sided pleural effusion increased in size. Dense pleural plaques in the RIGHT posterior chest may reflect changes of prior pleurodesis but do not show evidence of nodularity. LEFT chest is clear. Airways are patent with stable mild narrowing of the RIGHT mainstem bronchus. Musculoskeletal: See below for full musculoskeletal details. CT ABDOMEN PELVIS FINDINGS Hepatobiliary: RIGHT posterior hepatic lobe lesion (image 44/2) 3.8 cm previously 3.5 cm greatest axial dimension. Caudate lesion 2.5 cm previously 4.4 cm greatest axial dimension. Lesion at the boundary of  hepatic subsegment IV/V along the periphery of the liver (image 56/2) 3.5 cm previously 3.3 cm greatest axial dimension. No new suspicious hepatic lesions. Portal vein is patent. No pericholecystic stranding or biliary duct dilation. Pancreas: Normal, without mass, inflammation or ductal dilatation. Spleen: Normal. Adrenals/Urinary Tract: Adrenal glands are unremarkable. Symmetric renal enhancement. No sign of hydronephrosis. No suspicious renal lesion or perinephric stranding. Urinary bladder is grossly unremarkable. Cysts of the RIGHT kidney which require no dedicated imaging follow-up are unchanged, largest in the interpolar RIGHT kidney measuring 6.9 cm. Stomach/Bowel: No acute gastrointestinal findings. No sign of bowel obstruction. Scattered colonic diverticulosis. Vascular/Lymphatic: Small periportal lymph nodes are unchanged. Aortic atherosclerosis without aneurysmal dilation. No mesenteric adenopathy. No pelvic adenopathy. Reproductive: Prostatomegaly as before. Other: No ascites.  No pneumoperitoneum. Musculoskeletal: No acute bone finding. No destructive bone process. Spinal degenerative changes. 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 IMPRESSION: 1. RIGHT perihilar consolidative changes and bronchiectasis similar to previous imaging. 2. Nodular area in the RIGHT lung apex 11 mm previously 12 mm. Attention on follow-up. 3. Increased size of RIGHT-sided pleural effusion, mildly increased with moderate to  large volume and signs of prior talc pleurodesis. 4. On balance, diminished size of hepatic metastatic disease but with potential slight interval enlargement of 1 of the lesions in the RIGHT hepatic lobe. Suggest attention on follow-up. 5. Mild periportal nodal enlargement is stable. 6. No new lesions. 7. Aortic atherosclerosis and coronary artery disease. Aortic Atherosclerosis (ICD10-I70.0). Electronically Signed   By: Zetta Bills M.D.   On: 07/04/2022 12:05    ASSESSMENT AND PLAN: This is a very  pleasant 71 year old Caucasian male diagnosed with extensive stage (T4, N3, M1C) small cell lung cancer presented with large right upper lobe lung mass in addition to mediastinal and right supraclavicular lymphadenopathy and multiple metastatic liver lesions diagnosed in April 2021. The patient initially underwent systemic chemotherapy with carboplatin for an AUC of 5 on day 1, etoposide 100 mg per metered squared on days 1, 2, and 3 in addition to [facility] for myeloprotection.  He also received immunotherapy with Imfinzi on day 1 of every chemotherapy cycle.  He is status post 9 cycles.  Starting from cycle #5 he was on single agent immunotherapy with Imfinzi IV every 4 weeks.  The patient had evidence of disease progression on his scan from December 2021.  He was then started on systemic chemotherapy with carboplatin for an AUC of 5 on day 1, etoposide 100 mg per metered squared on days 1, 2, and 3 in addition to Tecentriq 1200 mg on day 1, the patient is status post 9 cycles.  Starting from cycle #5, the patient started maintenance immunotherapy with Tecentriq.  This was discontinued due to evidence of disease progression. The patient started third line treatment with Zepzelca (lurbinectedin) 3.2 Mg/M2 every 3-week status post 12 cycles of this treatment and he has been tolerating it fairly well except for fatigue.  This treatment was discontinued secondary to disease progression. He underwent fourth line treatment with systemic chemotherapy with irinotecan 65 Mg/M2 on days 1 and 8 every 3 weeks.  Status post 3 cycles.  He tolerated this treatment well with no concerning adverse effect except for mild fatigue. Unfortunately his scan showed evidence for disease progression especially in the liver. I recommended for the patient to discontinue his current treatment with irinotecan at this point. The patient was referred to Dr. Lennart Pall at [facility] for consideration of enrollment in the clinical trial  with the [facility].  He was not eligible for this trial.  He was also seen at [facility] for a second opinion and he did not like the clinical trial option offered to him. He started systemic chemotherapy again with carboplatin for AUC of 5 on day 1, etoposide 100 Mg/M2 on days 1, 2 and 3 with Cosela status post 2 cycles. The patient has been tolerating this treatment well with no concerning adverse effect except for mild fatigue and the baseline shortness of breath. He had repeat CT scan of the chest, abdomen and pelvis performed recently.  I personally and independently reviewed the scan images and discussed the result with the patient and his wife. His scan showed improvement of his disease except for one of the liver lesions that need close monitoring. I recommended for the patient to continue his current treatment with carboplatin and etoposide and he will proceed with cycle #3 today. I will see him back for follow-up visit in 3 weeks for evaluation before starting cycle #4. He was advised to call immediately if he has any other  concerning symptoms in the interval. The patient voices understanding of current disease status and treatment options and is in agreement with the current care plan.  All questions were answered. The patient knows to call the clinic with any problems, questions or concerns. We can certainly see the patient much sooner if necessary.  The total time spent in the appointment was 35 minutes.  Disclaimer: This note was dictated with voice recognition software. Similar sounding words can inadvertently be transcribed and may not be corrected upon review. Eilleen Kempf, MD 07/09/22

## 2022-07-10 ENCOUNTER — Inpatient Hospital Stay: Payer: Medicare Other

## 2022-07-10 VITALS — BP 125/74 | HR 83 | Temp 98.0°F | Resp 17

## 2022-07-10 DIAGNOSIS — Z5112 Encounter for antineoplastic immunotherapy: Secondary | ICD-10-CM | POA: Diagnosis not present

## 2022-07-10 DIAGNOSIS — C3411 Malignant neoplasm of upper lobe, right bronchus or lung: Secondary | ICD-10-CM | POA: Diagnosis not present

## 2022-07-10 DIAGNOSIS — C787 Secondary malignant neoplasm of liver and intrahepatic bile duct: Secondary | ICD-10-CM | POA: Diagnosis not present

## 2022-07-10 DIAGNOSIS — Z5111 Encounter for antineoplastic chemotherapy: Secondary | ICD-10-CM | POA: Diagnosis not present

## 2022-07-10 MED ORDER — SODIUM CHLORIDE 0.9 % IV SOLN
100.0000 mg/m2 | Freq: Once | INTRAVENOUS | Status: AC
  Administered 2022-07-10: 260 mg via INTRAVENOUS
  Filled 2022-07-10: qty 13

## 2022-07-10 MED ORDER — SODIUM CHLORIDE 0.9% FLUSH
10.0000 mL | INTRAVENOUS | Status: DC | PRN
  Administered 2022-07-10: 10 mL

## 2022-07-10 MED ORDER — TRILACICLIB DIHYDROCHLORIDE INJECTION 300 MG
240.0000 mg/m2 | Freq: Once | INTRAVENOUS | Status: AC
  Administered 2022-07-10: 600 mg via INTRAVENOUS
  Filled 2022-07-10: qty 40

## 2022-07-10 MED ORDER — SODIUM CHLORIDE 0.9 % IV SOLN
10.0000 mg | Freq: Once | INTRAVENOUS | Status: AC
  Administered 2022-07-10: 10 mg via INTRAVENOUS
  Filled 2022-07-10: qty 10

## 2022-07-10 MED ORDER — SODIUM CHLORIDE 0.9 % IV SOLN: Freq: Once | INTRAVENOUS | Status: AC

## 2022-07-10 MED ORDER — HEPARIN SOD (PORK) LOCK FLUSH 100 UNIT/ML IV SOLN
500.0000 [IU] | Freq: Once | INTRAVENOUS | Status: AC | PRN
  Administered 2022-07-10: 500 [IU]

## 2022-07-10 MED FILL — Dexamethasone Sodium Phosphate Inj 100 MG/10ML: INTRAMUSCULAR | Qty: 1 | Status: AC

## 2022-07-10 NOTE — Patient Instructions (Signed)
[facility]   Discharge Instructions: Thank you for choosing [facility] to provide your oncology and hematology care.   If you have a lab appointment with the [facility], please go directly to the [facility] and check in at the registration area.   Wear comfortable clothing and clothing appropriate for easy access to any Portacath or PICC line.   We strive to give you quality time with your provider. You may need to reschedule your appointment if you arrive late (15 or more minutes).  Arriving late affects you and other patients whose appointments are after yours.  Also, if you miss three or more appointments without notifying the office, you may be dismissed from the clinic at the provider's discretion.      For prescription refill requests, have your pharmacy contact our office and allow 72 hours for refills to be completed.    Today you received the following chemotherapy and/or immunotherapy agents: etoposide      To help prevent nausea and vomiting after your treatment, we encourage you to take your nausea medication as directed.  BELOW ARE SYMPTOMS THAT SHOULD BE REPORTED IMMEDIATELY: *FEVER GREATER THAN 100.4 F (38 C) OR HIGHER *CHILLS OR SWEATING *NAUSEA AND VOMITING THAT IS NOT CONTROLLED WITH YOUR NAUSEA MEDICATION *UNUSUAL SHORTNESS OF BREATH *UNUSUAL BRUISING OR BLEEDING *URINARY PROBLEMS (pain or burning when urinating, or frequent urination) *BOWEL PROBLEMS (unusual diarrhea, constipation, pain near the anus) TENDERNESS IN MOUTH AND THROAT WITH OR WITHOUT PRESENCE OF ULCERS (sore throat, sores in mouth, or a toothache) UNUSUAL RASH, SWELLING OR PAIN  UNUSUAL VAGINAL DISCHARGE OR ITCHING   Items with * indicate a potential emergency and should be followed up as soon as possible or go to the Emergency Department if any problems should occur.  Please show the CHEMOTHERAPY ALERT CARD or IMMUNOTHERAPY ALERT CARD at check-in  to the Emergency Department and triage nurse.  Should you have questions after your visit or need to cancel or reschedule your appointment, please contact [facility]  Dept: [phone]  and follow the prompts.  Office hours are 8:00 a.m. to 4:30 p.m. Monday - Friday. Please note that voicemails left after 4:00 p.m. may not be returned until the following business day.  We are closed weekends and major holidays. You have access to a nurse at all times for urgent questions. Please call the main number to the clinic Dept: [phone] and follow the prompts.   For any non-urgent questions, you may also contact your provider using MyChart. We now offer e-Visits for anyone 29 and older to request care online for non-urgent symptoms. For details visit [email or web address].   Also download the MyChart app! Go to the app store, search "MyChart", open the app, select [facility], and log in with your MyChart username and password.  Masks are optional in the cancer centers. If you would like for your care team to wear a mask while they are taking care of you, please let them know. For doctor visits, patients may have with them one support person who is at least 72 years old. At this time, visitors are not allowed in the infusion area.

## 2022-07-11 ENCOUNTER — Other Ambulatory Visit: Payer: Self-pay

## 2022-07-11 ENCOUNTER — Ambulatory Visit: Payer: Medicare Other | Admitting: Pulmonary Disease

## 2022-07-11 ENCOUNTER — Inpatient Hospital Stay: Payer: Medicare Other

## 2022-07-11 VITALS — BP 130/80 | HR 78 | Temp 98.7°F | Resp 18

## 2022-07-11 DIAGNOSIS — C3411 Malignant neoplasm of upper lobe, right bronchus or lung: Secondary | ICD-10-CM | POA: Diagnosis not present

## 2022-07-11 DIAGNOSIS — C787 Secondary malignant neoplasm of liver and intrahepatic bile duct: Secondary | ICD-10-CM | POA: Diagnosis not present

## 2022-07-11 DIAGNOSIS — Z5111 Encounter for antineoplastic chemotherapy: Secondary | ICD-10-CM | POA: Diagnosis not present

## 2022-07-11 DIAGNOSIS — Z5112 Encounter for antineoplastic immunotherapy: Secondary | ICD-10-CM | POA: Diagnosis not present

## 2022-07-11 MED ORDER — SODIUM CHLORIDE 0.9 % IV SOLN
10.0000 mg | Freq: Once | INTRAVENOUS | Status: AC
  Administered 2022-07-11: 10 mg via INTRAVENOUS
  Filled 2022-07-11: qty 10

## 2022-07-11 MED ORDER — SODIUM CHLORIDE 0.9 % IV SOLN: Freq: Once | INTRAVENOUS | Status: AC

## 2022-07-11 MED ORDER — SODIUM CHLORIDE 0.9 % IV SOLN
100.0000 mg/m2 | Freq: Once | INTRAVENOUS | Status: AC
  Administered 2022-07-11: 260 mg via INTRAVENOUS
  Filled 2022-07-11: qty 13

## 2022-07-11 MED ORDER — TRILACICLIB DIHYDROCHLORIDE INJECTION 300 MG
240.0000 mg/m2 | Freq: Once | INTRAVENOUS | Status: AC
  Administered 2022-07-11: 600 mg via INTRAVENOUS
  Filled 2022-07-11: qty 40

## 2022-07-11 MED ORDER — HEPARIN SOD (PORK) LOCK FLUSH 100 UNIT/ML IV SOLN
500.0000 [IU] | Freq: Once | INTRAVENOUS | Status: AC | PRN
  Administered 2022-07-11: 500 [IU]

## 2022-07-11 MED ORDER — SODIUM CHLORIDE 0.9% FLUSH
10.0000 mL | INTRAVENOUS | Status: DC | PRN
  Administered 2022-07-11: 10 mL

## 2022-07-11 NOTE — Patient Instructions (Signed)
[facility]  Discharge Instructions: Thank you for choosing [facility] to provide your oncology and hematology care.   If you have a lab appointment with the [facility], please go directly to the [facility] and check in at the registration area.   Wear comfortable clothing and clothing appropriate for easy access to any Portacath or PICC line.   We strive to give you quality time with your provider. You may need to reschedule your appointment if you arrive late (15 or more minutes).  Arriving late affects you and other patients whose appointments are after yours.  Also, if you miss three or more appointments without notifying the office, you may be dismissed from the clinic at the provider's discretion.      For prescription refill requests, have your pharmacy contact our office and allow 72 hours for refills to be completed.    Today you received the following chemotherapy and/or immunotherapy agents : Cosela, Etoposide      To help prevent nausea and vomiting after your treatment, we encourage you to take your nausea medication as directed.  BELOW ARE SYMPTOMS THAT SHOULD BE REPORTED IMMEDIATELY: *FEVER GREATER THAN 100.4 F (38 C) OR HIGHER *CHILLS OR SWEATING *NAUSEA AND VOMITING THAT IS NOT CONTROLLED WITH YOUR NAUSEA MEDICATION *UNUSUAL SHORTNESS OF BREATH *UNUSUAL BRUISING OR BLEEDING *URINARY PROBLEMS (pain or burning when urinating, or frequent urination) *BOWEL PROBLEMS (unusual diarrhea, constipation, pain near the anus) TENDERNESS IN MOUTH AND THROAT WITH OR WITHOUT PRESENCE OF ULCERS (sore throat, sores in mouth, or a toothache) UNUSUAL RASH, SWELLING OR PAIN  UNUSUAL VAGINAL DISCHARGE OR ITCHING   Items with * indicate a potential emergency and should be followed up as soon as possible or go to the Emergency Department if any problems should occur.  Please show the CHEMOTHERAPY ALERT CARD or IMMUNOTHERAPY ALERT CARD at  check-in to the Emergency Department and triage nurse.  Should you have questions after your visit or need to cancel or reschedule your appointment, please contact [facility]  Dept: [phone]  and follow the prompts.  Office hours are 8:00 a.m. to 4:30 p.m. Monday - Friday. Please note that voicemails left after 4:00 p.m. may not be returned until the following business day.  We are closed weekends and major holidays. You have access to a nurse at all times for urgent questions. Please call the main number to the clinic Dept: [phone] and follow the prompts.   For any non-urgent questions, you may also contact your provider using MyChart. We now offer e-Visits for anyone 86 and older to request care online for non-urgent symptoms. For details visit [email or web address].   Also download the MyChart app! Go to the app store, search "MyChart", open the app, select [facility], and log in with your MyChart username and password.  Masks are optional in the cancer centers. If you would like for your care team to wear a mask while they are taking care of you, please let them know. For doctor visits, patients may have with them one support person who is at least 72 years old. At this time, visitors are not allowed in the infusion area.

## 2022-07-16 ENCOUNTER — Inpatient Hospital Stay: Payer: Medicare Other

## 2022-07-16 ENCOUNTER — Encounter: Payer: Self-pay | Admitting: Internal Medicine

## 2022-07-16 ENCOUNTER — Inpatient Hospital Stay: Payer: Medicare Other | Attending: Internal Medicine

## 2022-07-16 DIAGNOSIS — C787 Secondary malignant neoplasm of liver and intrahepatic bile duct: Secondary | ICD-10-CM | POA: Diagnosis not present

## 2022-07-16 DIAGNOSIS — G8929 Other chronic pain: Secondary | ICD-10-CM | POA: Insufficient documentation

## 2022-07-16 DIAGNOSIS — M549 Dorsalgia, unspecified: Secondary | ICD-10-CM | POA: Insufficient documentation

## 2022-07-16 DIAGNOSIS — C3411 Malignant neoplasm of upper lobe, right bronchus or lung: Secondary | ICD-10-CM | POA: Insufficient documentation

## 2022-07-16 DIAGNOSIS — J449 Chronic obstructive pulmonary disease, unspecified: Secondary | ICD-10-CM | POA: Diagnosis not present

## 2022-07-16 DIAGNOSIS — R351 Nocturia: Secondary | ICD-10-CM | POA: Insufficient documentation

## 2022-07-16 DIAGNOSIS — Z5111 Encounter for antineoplastic chemotherapy: Secondary | ICD-10-CM | POA: Diagnosis not present

## 2022-07-16 DIAGNOSIS — N401 Enlarged prostate with lower urinary tract symptoms: Secondary | ICD-10-CM | POA: Diagnosis not present

## 2022-07-16 LAB — CMP (CANCER CENTER ONLY)
ALT: 19 U/L (ref 0–44)
AST: 19 U/L (ref 15–41)
Albumin: 3.6 g/dL (ref 3.5–5.0)
Alkaline Phosphatase: 62 U/L (ref 38–126)
Anion gap: 8 (ref 5–15)
BUN: 23 mg/dL (ref 8–23)
CO2: 27 mmol/L (ref 22–32)
Calcium: 8.7 mg/dL — ABNORMAL LOW (ref 8.9–10.3)
Chloride: 104 mmol/L (ref 98–111)
Creatinine: 1 mg/dL (ref 0.61–1.24)
GFR, Estimated: >60 mL/min (ref >60)
Glucose, Bld: 145 mg/dL — ABNORMAL HIGH (ref 70–99)
Potassium: 4.1 mmol/L (ref 3.5–5.1)
Sodium: 139 mmol/L (ref 135–145)
Total Bilirubin: 0.4 mg/dL (ref 0.3–1.2)
Total Protein: 6.5 g/dL (ref 6.5–8.1)

## 2022-07-16 LAB — CBC WITH DIFFERENTIAL (CANCER CENTER ONLY)
Abs Immature Granulocytes: 0.03 10*3/uL (ref 0.00–0.07)
Basophils Absolute: 0 10*3/uL (ref 0.0–0.1)
Basophils Relative: 1 %
Eosinophils Absolute: 0.1 10*3/uL (ref 0.0–0.5)
Eosinophils Relative: 2 %
HCT: 31.1 % — ABNORMAL LOW (ref 39.0–52.0)
Hemoglobin: 10.2 g/dL — ABNORMAL LOW (ref 13.0–17.0)
Immature Granulocytes: 1 %
Lymphocytes Relative: 16 %
Lymphs Abs: 0.5 10*3/uL — ABNORMAL LOW (ref 0.7–4.0)
MCH: 29 pg (ref 26.0–34.0)
MCHC: 32.8 g/dL (ref 30.0–36.0)
MCV: 88.4 fL (ref 80.0–100.0)
Monocytes Absolute: 0.1 10*3/uL (ref 0.1–1.0)
Monocytes Relative: 4 %
Neutro Abs: 2.6 10*3/uL (ref 1.7–7.7)
Neutrophils Relative %: 76 %
Platelet Count: 191 10*3/uL (ref 150–400)
RBC: 3.52 MIL/uL — ABNORMAL LOW (ref 4.22–5.81)
RDW: 16.1 % — ABNORMAL HIGH (ref 11.5–15.5)
WBC Count: 3.4 10*3/uL — ABNORMAL LOW (ref 4.0–10.5)
nRBC: 0 % (ref 0.0–0.2)

## 2022-07-23 ENCOUNTER — Encounter: Payer: Self-pay | Admitting: Pulmonary Disease

## 2022-07-23 ENCOUNTER — Ambulatory Visit (INDEPENDENT_AMBULATORY_CARE_PROVIDER_SITE_OTHER): Payer: Medicare Other | Admitting: Pulmonary Disease

## 2022-07-23 VITALS — BP 120/80 | HR 86 | Ht 69.5 in | Wt 295.2 lb

## 2022-07-23 DIAGNOSIS — J449 Chronic obstructive pulmonary disease, unspecified: Secondary | ICD-10-CM | POA: Diagnosis not present

## 2022-07-23 DIAGNOSIS — J90 Pleural effusion, not elsewhere classified: Secondary | ICD-10-CM | POA: Diagnosis not present

## 2022-07-23 DIAGNOSIS — C3411 Malignant neoplasm of upper lobe, right bronchus or lung: Secondary | ICD-10-CM

## 2022-07-23 DIAGNOSIS — Z86711 Personal history of pulmonary embolism: Secondary | ICD-10-CM

## 2022-07-23 MED ORDER — BREZTRI AEROSPHERE 160-9-4.8 MCG/ACT IN AERO: 2.0000 | INHALATION_SPRAY | Freq: Two times a day (BID) | RESPIRATORY_TRACT | 3 refills | Status: DC

## 2022-07-23 MED ORDER — BREZTRI AEROSPHERE 160-9-4.8 MCG/ACT IN AERO: 2.0000 | INHALATION_SPRAY | Freq: Two times a day (BID) | RESPIRATORY_TRACT | 0 refills | Status: DC

## 2022-07-23 NOTE — Patient Instructions (Signed)
Thank you for visiting Dr. Valeta Harms at [name]. Today we recommend the following:  Breztri Samples today   Return in about 3 months (around 10/23/2022).    Please do your part to reduce the spread of COVID-19.

## 2022-07-23 NOTE — Addendum Note (Signed)
Addended by: Alvin Critchley on: 07/23/2022 12:24 PM   Modules accepted: Orders

## 2022-07-23 NOTE — Progress Notes (Signed)
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  Patient was completed palliative radiotherapy and systemic chemotherapy followed by Dr. Earlie Server.  Patient unfortunately has had a recurrent right-sided effusion.  He has had 6 thoracentesis in the past.  Patient was referred today to discuss possibility of Pleurx catheter placement or consideration for other options of recurrent pleural effusion.  He has a longtime history of tobacco use.  Currently using albuterol only.  Was placed on Anoro in the past with no significant improvement.  When you know if there is other inhaler options.  OV 01/16/2022: Patient was seen today for follow-up after recent surgical consultation.  Dr. Kipp Brood felt as if risk for thoracotomy were too high and concern for BPF if operating on him.  Therefore we are here today to discuss pigtail placement for talc pleurodesis versus a indwelling pleural catheter placement.  Patient would like to proceed with talc pleurodesis.  We talked about the risk benefits of both today in the office.  He does have increasing shortness of breath.  Ultrasound completed also reveals persistent right-sided fluid accumulation today in the office.  He also had a two-view chest x-ray confirming this.  We reviewed this today in the office as well.  OV 02/19/2022: Here today for follow-up after recent talc pleurodesis.  Patient did well with the pigtail catheter placement drainage  and talc pleurodesis.  He was discharged home.  From respiratory standpoint doing okay.  He did have a recent CT scan of the chest on 02/06/2022 which revealed small amount of reaccumulation of fluid in the right chest.  However he does feel a lot better does not have the shortness of breath that he had in the past.  OV 04/24/2022: Seen today for follow-up of pleural effusion.  He did really well after his first talc pleurodesis.  Showed significant improvement in his pleural fluid collection on the right side.  He had a recent CT scan of the chest that does however show reaccumulation.  He would like to have this redone and we talked about the need for consider tube placement and drying out the right chest completely before undergoing a repeat talc pleurodesis.  He is on Eliquis and will need to stop this.  OV 07/23/2022: Here today for follow-up.  From a respiratory standpoint he does feel little bit more short of breath.  He does have wheezing in the morning at times.  Uses his albuterol as needed.  Had recent CT imaging of the chest that shows shows persistence of this right-sided pleural effusion with evidence of previous talc pleurodesis.  Or at least an attempt of talc pleurodesis.  He has enlarging lesions in the liver that made the decision with medical oncology to restart carboplatinum plus etoposide for his treatments.    Past Medical History:  Diagnosis Date   Chickenpox    Chronic knee pain    Chronic low back pain    COPD exacerbation ([place]) 10/11/2021   COPD  with exacerbation ([place]) 10/12/2021   Coronary artery calcification seen on CAT scan 11/2021   Coronary CTA 11/27/2021: Moderate to large right pleural effusion and compressive atelectasis right lung base. => Coronary Calcium Score 657.  Diffuse RCA calcification.  Minimal mild disease in the LAD and diagonal branches. == Overall limited study.  Notable artifact.   Essential hypertension    GERD (gastroesophageal reflux disease)     Hyperlipidemia    Malignant neoplasm of upper lobe of right lung ([place]) 04/04/2020   OSA (obstructive sleep apnea)    With nighttime oxygen supplementation   T4, M3, M1 C Metastatic Small Cell Lung Cancer 03/2020   large right upper lobe/right hilar mass with ipsilateral and contralateral mediastinal and right supraclavicular lymphadenopathy in addition to multiple liver lesios. He has disease progression after the first line of chemotherapy in December 2021.   Testosterone deficiency    Type 2 diabetes mellitus ([place])      Family History  Problem Relation Age of Onset   Heart attack Mother    Cancer Mother    Hypertension Mother    Arthritis Father    Asthma Father    Cancer Father    COPD Father    Heart attack Father    Hypertension Sister    Cancer Sister    Diabetes Sister    Asthma Sister    Cancer Sister    COPD Sister    Arthritis Sister    Asthma Sister    Diabetes Sister    Birth defects Maternal Grandfather    Arthritis Paternal Grandmother    Diabetes Paternal Grandmother    Arthritis Paternal Grandfather    Asthma Son    Other Neg Hx        pituitary disorder     Past Surgical History:  Procedure Laterality Date   CHEST TUBE INSERTION Right 01/20/2022   Procedure: CHEST TUBE INSERTION;  Surgeon: Candee Furbish, MD;  Location: [facility] ENDOSCOPY;  Service: Pulmonary;  Laterality: Right;  w/ Talc Pleurodesis, planned admit for Obs afterwards   CHEST TUBE INSERTION Right 04/28/2022   Procedure: INSERTION PLEURAL DRAINAGE CATHETER - Pigtail tail, drainage;  Surgeon: Candee Furbish, MD;  Location: [facility] ENDOSCOPY;  Service: Pulmonary;  Laterality: Right;  talc pleurodesis   COLONOSCOPY WITH PROPOFOL N/A 12/17/2018   Procedure: COLONOSCOPY WITH PROPOFOL;  Surgeon: Virgel Manifold, MD;  Location: [facility] ENDOSCOPY;  Service: Gastroenterology;  Laterality: N/A;   IR IMAGING GUIDED PORT INSERTION  04/17/2020   IR THORACENTESIS ASP PLEURAL SPACE W/IMG GUIDE  11/29/2021   IR  THORACENTESIS ASP PLEURAL SPACE W/IMG GUIDE  01/03/2022   JOINT REPLACEMENT Bilateral    REPLACEMENT TOTAL KNEE BILATERAL  2015   TONSILLECTOMY  1960   TRANSTHORACIC ECHOCARDIOGRAM  05/2020   a) 05/2020: EF 55 to 60%.  No R WMA.  Mild LVH.  Indeterminate LVEDP.  Unable to assess RVP.  Normal aortic and mitral valves.  Mildly elevated RAP.; b) 09/2021: EF 50-55%. No RWMA. Mild LVH. ~ LVEDP. Mild LA Dilation. NORMAL RV/RAP.  Normal MV/AoV.    Social History   Socioeconomic History   Marital status: Married    Spouse name: Not on file   Number of children: Not on file   Years of education: Not on file   Highest education level: Not on file  Occupational History   Not on file  Tobacco Use   Smoking status: Former   Smokeless tobacco: Never  Vaping Use   Vaping Use:  Never used  Substance and Sexual Activity   Alcohol use: Not Currently    Comment: rarely   Drug use: Not Currently   Sexual activity: Yes  Other Topics Concern   Not on file  Social History Narrative   Married.   Moved from [place].   Retired.   Social Determinants of Health   Financial Resource Strain: Low Risk  (02/14/2022)   Overall Financial Resource Strain (CARDIA)    Difficulty of Paying Living Expenses: Not hard at all  Food Insecurity: No Food Insecurity (02/14/2022)   Hunger Vital Sign    Worried About Running Out of Food in the Last Year: Never true    Ran Out of Food in the Last Year: Never true  Transportation Needs: No Transportation Needs (02/14/2022)   PRAPARE - Hydrologist (Medical): No    Lack of Transportation (Non-Medical): No  Physical Activity: Inactive (02/14/2022)   Exercise Vital Sign    Days of Exercise per Week: 0 days    Minutes of Exercise per Session: 0 min  Stress: No Stress Concern Present (02/14/2022)   [place]    Feeling of Stress : Only a little  Social Connections: Moderately Integrated  (02/14/2022)   Social Connection and Isolation Panel [NHANES]    Frequency of Communication with Friends and Family: Twice a week    Frequency of Social Gatherings with Friends and Family: Twice a week    Attends Religious Services: 1 to 4 times per year    Active Member of [employer] or Organizations: No    Attends Archivist Meetings: Never    Marital Status: Married  Human resources officer Violence: Not At Risk (02/14/2022)   Humiliation, Afraid, Rape, and Kick questionnaire    Fear of Current or Ex-Partner: No    Emotionally Abused: No    Physically Abused: No    Sexually Abused: No     Allergies  Allergen Reactions   Bupropion     Racing heart   Hydrochlorothiazide Other (See Comments)    Cramping to lower extremities     Outpatient Medications Prior to Visit  Medication Sig Dispense Refill   albuterol (VENTOLIN HFA) 108 (90 Base) MCG/ACT inhaler Inhale 2 puffs into the lungs every 6 (six) hours as needed for wheezing or shortness of breath. 18 g 0   ipratropium-albuterol (DUONEB) 0.5-2.5 (3) MG/3ML SOLN Take 3 mLs by nebulization every 4 (four) hours while awake for 3 days, THEN 3 mLs every 4 (four) hours as needed (shortness of breath or wheezing). 360 mL 0   oxymetazoline (AFRIN) 0.05 % nasal spray Place 1 spray into both nostrils daily.     amLODipine (NORVASC) 10 MG tablet TAKE 1 TABLET BY MOUTH EVERY DAY FOR BLOOD PRESSURE 90 tablet 3   B-D 3CC LUER-LOK SYR 22GX1" 22G X 1" 3 ML MISC USE AS INSTRUCTED FOR TESTOSTERONE INJECTION EVERY 2 WEEKS 10 each 2   bisoprolol (ZEBETA) 5 MG tablet TAKE 1 TABLET (5 MG TOTAL) BY MOUTH DAILY. 90 tablet 2   Black Pepper-Turmeric (TURMERIC COMPLEX/BLACK PEPPER PO) Take 1 tablet by mouth in the morning and at bedtime.     blood glucose meter kit and supplies KIT Dispense based on patient and insurance preference. Use up to four times daily as directed. 1 each 0   chlorpheniramine-HYDROcodone (TUSSIONEX) 10-8 MG/5ML SUER Take 5 mLs by mouth at  bedtime as needed for cough. 473 mL 0  ELIQUIS 5 MG TABS tablet TAKE 1 TABLET BY MOUTH TWICE A DAY 180 tablet 2   esomeprazole (NEXIUM) 20 MG capsule Take 20 mg by mouth in the morning.     gabapentin (NEURONTIN) 300 MG capsule Take 1 capsule (300 mg total) by mouth 2 (two) times daily. For back pain. 180 capsule 1   glipiZIDE (GLUCOTROL) 10 MG tablet TAKE 1 TABLET (10 MG TOTAL) BY MOUTH 2 (TWO) TIMES DAILY BEFORE A MEAL. FOR DIABETES. 180 tablet 3   Insulin Glargine (BASAGLAR KWIKPEN) 100 UNIT/ML Inject 24 Units into the skin at bedtime. For diabetes. 30 mL 1   Insulin Pen Needle (BD PEN NEEDLE NANO U/F) 32G X 4 MM MISC Use with insulin as prescribed Dx Code: E11.9 200 each 3   Lancets MISC USE UP TO 3 TIMES DAILY AS DIRECTED 100 each 2   losartan (COZAAR) 50 MG tablet Take 1 tablet (50 mg total) by mouth daily. For blood pressure. 90 tablet 3   Melatonin 10 MG CAPS Take 10 mg by mouth at bedtime.     metFORMIN (GLUCOPHAGE) 1000 MG tablet TAKE 1 TABLET (1,000 MG TOTAL) BY MOUTH 2 (TWO) TIMES DAILY WITH A MEAL. FOR DIABETES 180 tablet 1   Multiple Vitamin (MULTI-VITAMINS) TABS Take 1 tablet by mouth in the morning.     Omega-3 Fatty Acids (SALMON OIL PO) Take 1 capsule by mouth daily.     ONETOUCH ULTRA test strip USE UP TO 4 TIMES DAILY AS DIRECTED 100 strip 5   pioglitazone (ACTOS) 45 MG tablet TAKE 1 TABLET (45 MG TOTAL) BY MOUTH DAILY. FOR DIABETES. 90 tablet 1   pravastatin (PRAVACHOL) 40 MG tablet TAKE 1 TABLET BY MOUTH EVERY DAY IN THE EVENING FOR CHOLESTEROL 90 tablet 1   testosterone cypionate (DEPOTESTOSTERONE CYPIONATE) 200 MG/ML injection Inject 200 mg into the muscle every 14 (fourteen) days.     No facility-administered medications prior to visit.    Review of Systems  Constitutional:  Negative for chills, fever, malaise/fatigue and weight loss.  HENT:  Negative for hearing loss, sore throat and tinnitus.   Eyes:  Negative for blurred vision and double vision.  Respiratory:   Positive for shortness of breath. Negative for cough, hemoptysis, sputum production, wheezing and stridor.   Cardiovascular:  Negative for chest pain, palpitations, orthopnea, leg swelling and PND.  Gastrointestinal:  Negative for abdominal pain, constipation, diarrhea, heartburn, nausea and vomiting.  Genitourinary:  Negative for dysuria, hematuria and urgency.  Musculoskeletal:  Negative for joint pain and myalgias.  Skin:  Negative for itching and rash.  Neurological:  Negative for dizziness, tingling, weakness and headaches.  Endo/Heme/Allergies:  Negative for environmental allergies. Does not bruise/bleed easily.  Psychiatric/Behavioral:  Negative for depression. The patient is not nervous/anxious and does not have insomnia.   All other systems reviewed and are negative.    Objective:  Physical Exam Vitals reviewed.  Constitutional:      General: He is not in acute distress.    Appearance: He is well-developed. He is obese.  HENT:     Head: Normocephalic and atraumatic.  Eyes:     General: No scleral icterus.    Conjunctiva/sclera: Conjunctivae normal.     Pupils: Pupils are equal, round, and reactive to light.  Neck:     Vascular: No JVD.     Trachea: No tracheal deviation.  Cardiovascular:     Rate and Rhythm: Normal rate and regular rhythm.     Heart sounds: No murmur heard.  Pulmonary:     Effort: No tachypnea, accessory muscle usage or respiratory distress.     Breath sounds: No stridor. No wheezing, rhonchi or rales.     Comments: Diminished breath sounds in the right base compared to the left. Abdominal:     General: There is no distension.     Palpations: Abdomen is soft.     Tenderness: There is no abdominal tenderness.  Musculoskeletal:        General: No tenderness.     Cervical back: Neck supple.  Lymphadenopathy:     Cervical: No cervical adenopathy.  Skin:    General: Skin is warm and dry.     Capillary Refill: Capillary refill takes less than 2  seconds.     Findings: No rash.  Neurological:     Mental Status: He is alert and oriented to person, place, and time.  Psychiatric:        Behavior: Behavior normal.      Vitals:   07/23/22 1120  BP: 120/80  Pulse: 86  SpO2: 93%  Weight: 295 lb 3.2 oz (133.9 kg)  Height: 5' 9.5" (1.765 m)   93% on RA BMI Readings from Last 3 Encounters:  07/23/22 42.97 kg/m  07/09/22 44.36 kg/m  06/24/22 43.45 kg/m   Wt Readings from Last 3 Encounters:  07/23/22 295 lb 3.2 oz (133.9 kg)  07/09/22 296 lb 1 oz (134.3 kg)  06/24/22 290 lb (131.5 kg)     CBC    Component Value Date/Time   WBC 3.4 (L) 07/16/2022 1351   WBC 2.5 (L) 10/18/2021 1533   RBC 3.52 (L) 07/16/2022 1351   HGB 10.2 (L) 07/16/2022 1351   HCT 31.1 (L) 07/16/2022 1351   HCT 42.7 10/06/2017 1052   PLT 191 07/16/2022 1351   MCV 88.4 07/16/2022 1351   MCH 29.0 07/16/2022 1351   MCHC 32.8 07/16/2022 1351   RDW 16.1 (H) 07/16/2022 1351   LYMPHSABS 0.5 (L) 07/16/2022 1351   MONOABS 0.1 07/16/2022 1351   EOSABS 0.1 07/16/2022 1351   BASOSABS 0.0 07/16/2022 1351     Chest Imaging: Chest x-ray 11/29/2021: No significant effusion on the right following a thoracentesis, no pneumothorax.  Some scarring in the right base, density in the medial portion of the right upper lobe history of small cell with radiation. The patient's images have been independently reviewed by me.    CT Chest Feb 2022: CT scan of the chest reveals some pleural studding as well as small pleural effusion on the right side. The patient's images have been independently reviewed by me.    CT chest 04/17/2022: Accumulation of right pleural fluid. The patient's images have been independently reviewed by me.    CT chest 07/04/2022: Small 11 x 12 mm nodular area in the right apex.  Increased right-sided pleural effusion.  Diminished size of hepatic metastasis. The patient's images have been independently reviewed by me.    Pulmonary Functions  Testing Results:     No data to display           FeNO:   Pathology:   Echocardiogram:   Heart Catheterization:     Assessment & Plan:     ICD-10-CM   1. Small cell lung cancer, right upper lobe (HCC)  C34.11     2. Chronic obstructive pulmonary disease, unspecified COPD type ([place])  J44.9     3. Recurrent pleural effusion  J90     4. History of pulmonary embolism  Z86.711  5. Recurrent pleural effusion on right  J90       Discussion:  This is a 71 year old gentleman, history of COPD, stage IV small cell carcinoma with recurrent right-sided pleural effusion, tapped several times underwent pigtail catheter placement attempt at drainage also an attempt at right-sided talc pleurodesis as an outpatient.  He has had slow reaccumulation of the fluid.  He does have shortness of breath today.  Most which I think is related to his chest tightness and shortness of breath also wheezing today.  He is not on any maintenance inhalers for his COPD.  Plan: Start Breztri samples. We also sent a prescription in for [facility]. Continue albuterol as needed. Would recommend using his nebulizer more frequently. If he has worsening shortness of breath we feel like we need to take fluid off his chest again we can always do a repeat thoracentesis    Current Outpatient Medications:    albuterol (VENTOLIN HFA) 108 (90 Base) MCG/ACT inhaler, Inhale 2 puffs into the lungs every 6 (six) hours as needed for wheezing or shortness of breath., Disp: 18 g, Rfl: 0   ipratropium-albuterol (DUONEB) 0.5-2.5 (3) MG/3ML SOLN, Take 3 mLs by nebulization every 4 (four) hours while awake for 3 days, THEN 3 mLs every 4 (four) hours as needed (shortness of breath or wheezing)., Disp: 360 mL, Rfl: 0   oxymetazoline (AFRIN) 0.05 % nasal spray, Place 1 spray into both nostrils daily., Disp: , Rfl:    amLODipine (NORVASC) 10 MG tablet, TAKE 1 TABLET BY MOUTH EVERY DAY FOR BLOOD PRESSURE, Disp: 90 tablet, Rfl: 3    B-D 3CC LUER-LOK SYR 22GX1" 22G X 1" 3 ML MISC, USE AS INSTRUCTED FOR TESTOSTERONE INJECTION EVERY 2 WEEKS, Disp: 10 each, Rfl: 2   bisoprolol (ZEBETA) 5 MG tablet, TAKE 1 TABLET (5 MG TOTAL) BY MOUTH DAILY., Disp: 90 tablet, Rfl: 2   Black Pepper-Turmeric (TURMERIC COMPLEX/BLACK PEPPER PO), Take 1 tablet by mouth in the morning and at bedtime., Disp: , Rfl:    blood glucose meter kit and supplies KIT, Dispense based on patient and insurance preference. Use up to four times daily as directed., Disp: 1 each, Rfl: 0   chlorpheniramine-HYDROcodone (TUSSIONEX) 10-8 MG/5ML SUER, Take 5 mLs by mouth at bedtime as needed for cough., Disp: 473 mL, Rfl: 0   ELIQUIS 5 MG TABS tablet, TAKE 1 TABLET BY MOUTH TWICE A DAY, Disp: 180 tablet, Rfl: 2   esomeprazole (NEXIUM) 20 MG capsule, Take 20 mg by mouth in the morning., Disp: , Rfl:    gabapentin (NEURONTIN) 300 MG capsule, Take 1 capsule (300 mg total) by mouth 2 (two) times daily. For back pain., Disp: 180 capsule, Rfl: 1   glipiZIDE (GLUCOTROL) 10 MG tablet, TAKE 1 TABLET (10 MG TOTAL) BY MOUTH 2 (TWO) TIMES DAILY BEFORE A MEAL. FOR DIABETES., Disp: 180 tablet, Rfl: 3   Insulin Glargine (BASAGLAR KWIKPEN) 100 UNIT/ML, Inject 24 Units into the skin at bedtime. For diabetes., Disp: 30 mL, Rfl: 1   Insulin Pen Needle (BD PEN NEEDLE NANO U/F) 32G X 4 MM MISC, Use with insulin as prescribed Dx Code: E11.9, Disp: 200 each, Rfl: 3   Lancets MISC, USE UP TO 3 TIMES DAILY AS DIRECTED, Disp: 100 each, Rfl: 2   losartan (COZAAR) 50 MG tablet, Take 1 tablet (50 mg total) by mouth daily. For blood pressure., Disp: 90 tablet, Rfl: 3   Melatonin 10 MG CAPS, Take 10 mg by mouth at bedtime., Disp: , Rfl:  metFORMIN (GLUCOPHAGE) 1000 MG tablet, TAKE 1 TABLET (1,000 MG TOTAL) BY MOUTH 2 (TWO) TIMES DAILY WITH A MEAL. FOR DIABETES, Disp: 180 tablet, Rfl: 1   Multiple Vitamin (MULTI-VITAMINS) TABS, Take 1 tablet by mouth in the morning., Disp: , Rfl:    Omega-3 Fatty Acids  (SALMON OIL PO), Take 1 capsule by mouth daily., Disp: , Rfl:    ONETOUCH ULTRA test strip, USE UP TO 4 TIMES DAILY AS DIRECTED, Disp: 100 strip, Rfl: 5   pioglitazone (ACTOS) 45 MG tablet, TAKE 1 TABLET (45 MG TOTAL) BY MOUTH DAILY. FOR DIABETES., Disp: 90 tablet, Rfl: 1   pravastatin (PRAVACHOL) 40 MG tablet, TAKE 1 TABLET BY MOUTH EVERY DAY IN THE EVENING FOR CHOLESTEROL, Disp: 90 tablet, Rfl: 1   testosterone cypionate (DEPOTESTOSTERONE CYPIONATE) 200 MG/ML injection, Inject 200 mg into the muscle every 14 (fourteen) days., Disp: , Rfl:    Garner Nash, DO [facility] 07/23/2022 11:24 AM

## 2022-07-24 ENCOUNTER — Other Ambulatory Visit: Payer: Self-pay

## 2022-07-24 ENCOUNTER — Inpatient Hospital Stay: Payer: Medicare Other

## 2022-07-24 DIAGNOSIS — C3411 Malignant neoplasm of upper lobe, right bronchus or lung: Secondary | ICD-10-CM | POA: Diagnosis not present

## 2022-07-24 DIAGNOSIS — R351 Nocturia: Secondary | ICD-10-CM | POA: Diagnosis not present

## 2022-07-24 DIAGNOSIS — J449 Chronic obstructive pulmonary disease, unspecified: Secondary | ICD-10-CM | POA: Diagnosis not present

## 2022-07-24 DIAGNOSIS — N401 Enlarged prostate with lower urinary tract symptoms: Secondary | ICD-10-CM | POA: Diagnosis not present

## 2022-07-24 DIAGNOSIS — C787 Secondary malignant neoplasm of liver and intrahepatic bile duct: Secondary | ICD-10-CM | POA: Diagnosis not present

## 2022-07-24 DIAGNOSIS — Z5111 Encounter for antineoplastic chemotherapy: Secondary | ICD-10-CM | POA: Diagnosis not present

## 2022-07-24 LAB — CBC WITH DIFFERENTIAL (CANCER CENTER ONLY)
Abs Immature Granulocytes: 0 10*3/uL (ref 0.00–0.07)
Basophils Absolute: 0 10*3/uL (ref 0.0–0.1)
Basophils Relative: 1 %
Eosinophils Absolute: 0 10*3/uL (ref 0.0–0.5)
Eosinophils Relative: 2 %
HCT: 30.7 % — ABNORMAL LOW (ref 39.0–52.0)
Hemoglobin: 9.9 g/dL — ABNORMAL LOW (ref 13.0–17.0)
Immature Granulocytes: 0 %
Lymphocytes Relative: 21 %
Lymphs Abs: 0.5 10*3/uL — ABNORMAL LOW (ref 0.7–4.0)
MCH: 28.9 pg (ref 26.0–34.0)
MCHC: 32.2 g/dL (ref 30.0–36.0)
MCV: 89.5 fL (ref 80.0–100.0)
Monocytes Absolute: 0.4 10*3/uL (ref 0.1–1.0)
Monocytes Relative: 18 %
Neutro Abs: 1.4 10*3/uL — ABNORMAL LOW (ref 1.7–7.7)
Neutrophils Relative %: 58 %
Platelet Count: 80 10*3/uL — ABNORMAL LOW (ref 150–400)
RBC: 3.43 MIL/uL — ABNORMAL LOW (ref 4.22–5.81)
RDW: 17.7 % — ABNORMAL HIGH (ref 11.5–15.5)
WBC Count: 2.4 10*3/uL — ABNORMAL LOW (ref 4.0–10.5)
nRBC: 0 % (ref 0.0–0.2)

## 2022-07-24 LAB — CMP (CANCER CENTER ONLY)
ALT: 16 U/L (ref 0–44)
AST: 17 U/L (ref 15–41)
Albumin: 4.1 g/dL (ref 3.5–5.0)
Alkaline Phosphatase: 63 U/L (ref 38–126)
Anion gap: 9 (ref 5–15)
BUN: 14 mg/dL (ref 8–23)
CO2: 25 mmol/L (ref 22–32)
Calcium: 8.2 mg/dL — ABNORMAL LOW (ref 8.9–10.3)
Chloride: 106 mmol/L (ref 98–111)
Creatinine: 1 mg/dL (ref 0.61–1.24)
GFR, Estimated: >60 mL/min (ref >60)
Glucose, Bld: 226 mg/dL — ABNORMAL HIGH (ref 70–99)
Potassium: 4 mmol/L (ref 3.5–5.1)
Sodium: 140 mmol/L (ref 135–145)
Total Bilirubin: 0.3 mg/dL (ref 0.3–1.2)
Total Protein: 6.8 g/dL (ref 6.5–8.1)

## 2022-07-25 NOTE — Progress Notes (Unsigned)
[facility] OFFICE PROGRESS NOTE  [name], NP [place] [address]  DIAGNOSIS: Relapsed extensive stage (T4, N3, M1c)  small cell lung cancer diagnosed in April 2021 and presented with large right upper lobe/right hilar mass with ipsilateral and contralateral mediastinal and right supraclavicular lymphadenopathy in addition to multiple liver lesions. He has disease progression after the first line of chemotherapy in December 2021.  PRIOR THERAPY: 1) Palliative radiotherapy to the right upper lobe lung mass under the care of Dr. Lisbeth Renshaw. 2) Systemic chemotherapy with carboplatin for AUC of 5 on day 1, etoposide 100 mg/M2 on days 1, 2 and 3 in addition to Imfinzi 1500 mg IV every 3 weeks with chemotherapy then every 4 weeks for maintenance if the patient has no evidence for progression.  He will also receive Cosela 240 mg/m2 on the days of the chemotherapy.  Status post 9 cycles.  Starting from cycle #5 the patient will be on maintenance treatment with immunotherapy with Imfinzi 1500 mg IV every 4 weeks. Last dose of chemotherapy was given on November 13, 2020. This treatment was discontinued secondary to disease progression 3) Systemic chemotherapy with carboplatin for AUC of 5 on day 1, etoposide 100 mg/M2 on days 1, 2 and 3, Tecentriq 1200 mg IV every 3 weeks as well as Cosela 250 mg/M2 on the days of the chemotherapy every 3 weeks.  First dose December 18, 2020.  Status post 8 cycles. 4) Zepzelca 3.2 mgm2 IV every 3 weeks. Last dose on 01/23/22. Status post 12 cycles 5) Palliative systemic chemotherapy with irinotecan 65 mg/m2 on days 1 and 8 IV every 3 weeks.  Status post 3 cycles.  Last dose was given April 01, 2022 discontinued secondary to disease progression  CURRENT THERAPY: Patient referred to [facility] for ETCTN [id number] clinical trial which he declined due to travel burden. We are trying again with palliative systemic chemotherapy with carboplatin for an AC of 5  on day 1, etoposide 100 mg per metered squared on days 1, 2, and 3 with Cosela before chemotherapy.  First dose on 05/28/22.  Status post 3 cycles.  INTERVAL HISTORY: Adam Wise 71 y.o. male returns to the clinic today for a follow-up visit.  The patient is accompanied by his wife.  The patient is currently undergoing chemotherapy and is tolerating it fairly well.  In the interval since last being seen, the patient saw Dr. Valeta Harms from pulmonary medicine who started him on inhalers for maintenance for his COPD. The patient reportedly had audible wheezing that day. He has not noticed significant improvement in his breathing. Some days his breathing is worse than others. He previously had a talc pleurodesis performed. He does have some stable moderate/large pleural fluid which they are monitoring for now. His wife notices he has worse shortness of breath when he is in pain. He has degenerative disc disease. He takes tylenol. He has a TENS unit which he does not notice significant improvement from. He previously underwent PT and is trying to perform the exercises he learned. He also previously saw orthopedics/neuro who performed an injection without significant relief. He is wondering what else he should do. No osseous metastatic lesions on most recent imaging. He also mentions nocturia. He has diabetes and sometimes this is correlated with his blood sugar, but other times not. His scans do note prostamegaly. His PCP ordered PSA to be drawn but it has not been drawn at this time.  Besides the chronic back  pain, nocturia, and shortness of breath the patient is feeling fairly well. He denies fevers, chills, night sweats, or unexplained weight loss. Denies any chest pain or hemoptysis.  Denies any nausea, vomiting, diarrhea, or constipation. Denies any headache or visual changes.  He is here today for evaluation and repeat blood work before starting cycle #4.   MEDICAL HISTORY: Past Medical History:  Diagnosis  Date   Chickenpox    Chronic knee pain    Chronic low back pain    COPD exacerbation ([facility]) 10/11/2021   COPD with exacerbation ([place]) 10/12/2021   Coronary artery calcification seen on CAT scan 11/2021   Coronary CTA 11/27/2021: Moderate to large right pleural effusion and compressive atelectasis right lung base. => Coronary Calcium Score 657.  Diffuse RCA calcification.  Minimal mild disease in the LAD and diagonal branches. == Overall limited study.  Notable artifact.   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 He has disease progression after the first line of chemotherapy in December 2021.   Testosterone deficiency    Type 2 diabetes mellitus (H[facility])     ALLERGIES:  is allergic to bupropion and hydrochlorothiazide.  MEDICATIONS:  Current Outpatient Medications  Medication Sig Dispense Refill   albuterol (VENTOLIN HFA) 108 (90 Base) MCG/ACT inhaler Inhale 2 puffs into the lungs every 6 (six) hours as needed for wheezing or shortness of breath. 18 g 0   amLODipine (NORVASC) 10 MG tablet TAKE 1 TABLET BY MOUTH EVERY DAY FOR BLOOD PRESSURE 90 tablet 3   B-D 3CC LUER-LOK SYR 22GX1" 22G X 1" 3 ML MISC USE AS INSTRUCTED FOR TESTOSTERONE INJECTION EVERY 2 WEEKS 10 each 2   bisoprolol (ZEBETA) 5 MG tablet TAKE 1 TABLET (5 MG TOTAL) BY MOUTH DAILY. 90 tablet 2   Black Pepper-Turmeric (TURMERIC COMPLEX/BLACK PEPPER PO) Take 1 tablet by mouth in the morning and at bedtime.     blood glucose meter kit and supplies KIT Dispense based on patient and insurance preference. Use up to four times daily as  directed. 1 each 0   Budeson-Glycopyrrol-Formoterol (BREZTRI AEROSPHERE) 160-9-4.8 MCG/ACT AERO Inhale 2 puffs into the lungs in the morning and at bedtime. 10.7 g 3   chlorpheniramine-HYDROcodone (TUSSIONEX) 10-8 MG/5ML SUER Take 5 mLs by mouth at bedtime as needed for cough. 473 mL 0   ELIQUIS 5 MG TABS tablet TAKE 1 TABLET BY MOUTH TWICE A DAY 180 tablet 2   esomeprazole (NEXIUM) 20 MG capsule Take 20 mg by mouth in the morning.     gabapentin (NEURONTIN) 300 MG capsule Take 1 capsule (300 mg total) by mouth 2 (two) times daily. For back pain. 180 capsule 1   glipiZIDE (GLUCOTROL) 10 MG tablet TAKE 1 TABLET (10 MG TOTAL) BY MOUTH 2 (TWO) TIMES DAILY BEFORE A MEAL. FOR DIABETES. 180 tablet 3   glucose blood (ONETOUCH ULTRA) test strip USE UP TO 4 TIMES DAILY AS DIRECTED 400 strip 5   Insulin Glargine (BASAGLAR KWIKPEN) 100 UNIT/ML Inject 24 Units into the skin at bedtime. For diabetes. 30 mL 1   Insulin Pen Needle (BD PEN NEEDLE NANO U/F)  32G X 4 MM MISC Use with insulin as prescribed Dx Code: E11.9 200 each 3   ipratropium-albuterol (DUONEB) 0.5-2.5 (3) MG/3ML SOLN Take 3 mLs by nebulization every 4 (four) hours while awake for 3 days, THEN 3 mLs every 4 (four) hours as needed (shortness of breath or wheezing). 360 mL 0   Lancets MISC USE UP TO 3 TIMES DAILY AS DIRECTED 100 each 2   losartan (COZAAR) 50 MG tablet Take 1 tablet (50 mg total) by mouth daily. For blood pressure. 90 tablet 3   Melatonin 10 MG CAPS Take 10 mg by mouth at bedtime.     metFORMIN (GLUCOPHAGE) 1000 MG tablet TAKE 1 TABLET (1,000 MG TOTAL) BY MOUTH 2 (TWO) TIMES DAILY WITH A MEAL. FOR DIABETES 180 tablet 1   Multiple Vitamin (MULTI-VITAMINS) TABS Take 1 tablet by mouth in the morning.     Omega-3 Fatty Acids (SALMON OIL PO) Take 1 capsule by mouth daily.     oxymetazoline (AFRIN) 0.05 % nasal spray Place 1 spray into both nostrils daily.     pioglitazone (ACTOS) 45 MG tablet TAKE 1 TABLET (45 MG TOTAL) BY MOUTH DAILY. FOR  DIABETES. 90 tablet 1   pravastatin (PRAVACHOL) 40 MG tablet TAKE 1 TABLET BY MOUTH EVERY DAY IN THE EVENING FOR CHOLESTEROL 90 tablet 1   testosterone cypionate (DEPOTESTOSTERONE CYPIONATE) 200 MG/ML injection Inject 200 mg into the muscle every 14 (fourteen) days.     No current facility-administered medications for this visit.    SURGICAL HISTORY:  Past Surgical History:  Procedure Laterality Date   CHEST TUBE INSERTION Right 01/20/2022   Procedure: CHEST TUBE INSERTION;  Surgeon: Candee Furbish, MD;  Location: [facility] ENDOSCOPY;  Service: Pulmonary;  Laterality: Right;  w/ Talc Pleurodesis, planned admit for Obs afterwards   CHEST TUBE INSERTION Right 04/28/2022   Procedure: INSERTION PLEURAL DRAINAGE CATHETER - Pigtail tail, drainage;  Surgeon: Candee Furbish, MD;  Location: [facility] ENDOSCOPY;  Service: Pulmonary;  Laterality: Right;  talc pleurodesis   COLONOSCOPY WITH PROPOFOL N/A 12/17/2018   Procedure: COLONOSCOPY WITH PROPOFOL;  Surgeon: Virgel Manifold, MD;  Location: [facility] ENDOSCOPY;  Service: Gastroenterology;  Laterality: N/A;   IR IMAGING GUIDED PORT INSERTION  04/17/2020   IR THORACENTESIS ASP PLEURAL SPACE W/IMG GUIDE  11/29/2021   IR THORACENTESIS ASP PLEURAL SPACE W/IMG GUIDE  01/03/2022   JOINT REPLACEMENT Bilateral    REPLACEMENT TOTAL KNEE BILATERAL  2015   TONSILLECTOMY  1960   TRANSTHORACIC ECHOCARDIOGRAM  05/2020   a) 05/2020: EF 55 to 60%.  No R WMA.  Mild LVH.  Indeterminate LVEDP.  Unable to assess RVP.  Normal aortic and mitral valves.  Mildly elevated RAP.; b) 09/2021: EF 50-55%. No RWMA. Mild LVH. ~ LVEDP. Mild LA Dilation. NORMAL RV/RAP.  Normal MV/AoV.    REVIEW OF SYSTEMS:   Review of Systems  Constitutional: Negative for appetite change, chills, fatigue, fever and unexpected weight change.  HENT: Negative for mouth sores, nosebleeds, sore throat and trouble swallowing.   Eyes: Negative for eye problems and icterus.  Respiratory: Positive for dyspnea. Negative  for cough, hemoptysis, and wheezing.   Cardiovascular: Negative for chest pain and leg swelling.  Gastrointestinal: Negative for abdominal pain, constipation, diarrhea, nausea and vomiting.  Genitourinary: Positive for nocturia.  Negative for bladder incontinence, difficulty urinating, dysuria, frequency and hematuria.   Musculoskeletal: Positive for chronic back pain.  Negative for gait problem, neck pain and neck stiffness.  Skin: Negative for itching and rash.  Neurological: Negative for dizziness, extremity weakness, gait problem, headaches, light-headedness and seizures.  Hematological: Negative for adenopathy. Does not bruise/bleed easily.  Psychiatric/Behavioral: Negative for confusion, depression and sleep disturbance. The patient is not nervous/anxious.     PHYSICAL EXAMINATION:  Blood pressure (!) 153/73, pulse 91, temperature 98 F (36.7 C), resp. rate 17, weight 295 lb 4.8 oz (133.9 kg), SpO2 93 %.  ECOG PERFORMANCE STATUS: 1  Physical Exam  Constitutional: Oriented to person, place, and time and well-developed, well-nourished, and in no distress.  HENT:  Head: Normocephalic and atraumatic.  Mouth/Throat: Oropharynx is clear and moist. No oropharyngeal exudate.  Eyes: Conjunctivae are normal. Right eye exhibits no discharge. Left eye exhibits no discharge. No scleral icterus.  Neck: Normal range of motion. Neck supple.  Cardiovascular: Normal rate, regular rhythm, normal heart sounds and intact distal pulses.   Pulmonary/Chest: Effort normal.  Decreased breath sounds in the right lung.  No respiratory distress. No wheezes. No rales.  Abdominal: Soft. Bowel sounds are normal. Exhibits no distension and no mass. There is no tenderness.  Musculoskeletal: Normal range of motion. Exhibits no edema.  Lymphadenopathy:    No cervical adenopathy.  Neurological: Alert and oriented to person, place, and time. Exhibits normal muscle tone. Gait normal. Coordination normal.  Skin: Skin  is warm and dry. No rash noted. Not diaphoretic. No erythema. No pallor.  Psychiatric: Mood, memory and judgment normal.  Vitals reviewed.  LABORATORY DATA: Lab Results  Component Value Date   WBC 3.1 (L) 07/30/2022   HGB 9.9 (L) 07/30/2022   HCT 30.4 (L) 07/30/2022   MCV 89.7 07/30/2022   PLT 184 07/30/2022      Chemistry      Component Value Date/Time   NA 139 07/30/2022 1129   K 4.0 07/30/2022 1129   CL 105 07/30/2022 1129   CO2 28 07/30/2022 1129   BUN 14 07/30/2022 1129   CREATININE 1.03 07/30/2022 1129   CREATININE 1.45 (H) 10/18/2021 1533      Component Value Date/Time   CALCIUM 8.9 07/30/2022 1129   ALKPHOS 65 07/30/2022 1129   AST 19 07/30/2022 1129   ALT 16 07/30/2022 1129   BILITOT 0.4 07/30/2022 1129       RADIOGRAPHIC STUDIES:  CT Chest W Contrast  Result Date: 07/04/2022 CLINICAL DATA:  Primary Cancer Type: Lung Imaging Indication: Assess response to therapy Interval therapy since last imaging? Yes Initial Cancer Diagnosis Date: 04/09/2020; Established by: Biopsy-proven Detailed Pathology: Extensive stage small cell lung cancer. Primary Tumor location:  Right upper lobe. Metastatic liver lesions. Surgeries: No. Chemotherapy: Yes; Ongoing? Yes; Most recent administration: 06/18/2022 Immunotherapy?  Yes; Type: Imfinzi, Tecentriq; Ongoing? No Radiation therapy? Yes; Date Range: 04/12/2020 - 05/02/2020; Target: Right lung Other Cancer Therapies: Talc pleurodesis for recurrent pleural effusion on 01/20/2022. * Tracking Code: BO * EXAM: CT CHEST, ABDOMEN, AND PELVIS WITH CONTRAST TECHNIQUE: Multidetector CT imaging of the chest, abdomen and pelvis was performed following the standard protocol during bolus administration of intravenous contrast. RADIATION DOSE REDUCTION: This exam was performed according to the departmental dose-optimization program which includes automated exposure control, adjustment of the mA and/or kV according to patient size and/or use of iterative  reconstruction technique. CONTRAST:  159mL OMNIPAQUE IOHEXOL 300 MG/ML  SOLN COMPARISON:  Most recent CT chest, abdomen and pelvis 04/17/2022. 03/28/2020 PET-CT. FINDINGS: CT CHEST FINDINGS Cardiovascular: Calcified and noncalcified atheromatous plaque in the thoracic aorta. No aneurysmal dilation. Normal heart size without pericardial effusion. Scattered three-vessel coronary calcifications. Central pulmonary vasculature  is unremarkable on venous phase assessment. RIGHT-sided Port-A-Cath terminates at the caval to atrial junction. Mediastinum/Nodes: Esophagus grossly normal. Thoracic inlet structures are normal. No mediastinal adenopathy. No hilar adenopathy. No axillary adenopathy. No internal mammary adenopathy. Lungs/Pleura: RIGHT perihilar consolidative changes and bronchiectasis similar to previous imaging. Nodular area in the RIGHT lung apex 11 mm previously 12 mm. Moderately large RIGHT-sided pleural effusion increased in size. Dense pleural plaques in the RIGHT posterior chest may reflect changes of prior pleurodesis but do not show evidence of nodularity. LEFT chest is clear. Airways are patent with stable mild narrowing of the RIGHT mainstem bronchus. Musculoskeletal: See below for full musculoskeletal details. CT ABDOMEN PELVIS FINDINGS Hepatobiliary: RIGHT posterior hepatic lobe lesion (image 44/2) 3.8 cm previously 3.5 cm greatest axial dimension. Caudate lesion 2.5 cm previously 4.4 cm greatest axial dimension. Lesion at the boundary of hepatic subsegment IV/V along the periphery of the liver (image 56/2) 3.5 cm previously 3.3 cm greatest axial dimension. No new suspicious hepatic lesions. Portal vein is patent. No pericholecystic stranding or biliary duct dilation. Pancreas: Normal, without mass, inflammation or ductal dilatation. Spleen: Normal. Adrenals/Urinary Tract: Adrenal glands are unremarkable. Symmetric renal enhancement. No sign of hydronephrosis. No suspicious renal lesion or perinephric  stranding. Urinary bladder is grossly unremarkable. Cysts of the RIGHT kidney which require no dedicated imaging follow-up are unchanged, largest in the interpolar RIGHT kidney measuring 6.9 cm. Stomach/Bowel: No acute gastrointestinal findings. No sign of bowel obstruction. Scattered colonic diverticulosis. Vascular/Lymphatic: Small periportal lymph nodes are unchanged. Aortic atherosclerosis without aneurysmal dilation. No mesenteric adenopathy. No pelvic adenopathy. Reproductive: Prostatomegaly as before. Other: No ascites.  No pneumoperitoneum. Musculoskeletal: No acute bone finding. No destructive bone process. Spinal degenerative changes. 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 IMPRESSION: 1. RIGHT perihilar consolidative changes and bronchiectasis similar to previous imaging. 2. Nodular area in the RIGHT lung apex 11 mm previously 12 mm. Attention on follow-up. 3. Increased size of RIGHT-sided pleural effusion, mildly increased with moderate to large volume and signs of prior talc pleurodesis. 4. On balance, diminished size of hepatic metastatic disease but with potential slight interval enlargement of 1 of the lesions in the RIGHT hepatic lobe. Suggest attention on follow-up. 5. Mild periportal nodal enlargement is stable. 6. No new lesions. 7. Aortic atherosclerosis and coronary artery disease. Aortic Atherosclerosis (ICD10-I70.0). Electronically Signed   By: Zetta Bills M.D.   On: 07/04/2022 12:05   CT Abdomen Pelvis W Contrast  Result Date: 07/04/2022 CLINICAL DATA:  Primary Cancer Type: Lung Imaging Indication: Assess response to therapy Interval therapy since last imaging? Yes Initial Cancer Diagnosis Date: 04/09/2020; Established by: Biopsy-proven Detailed Pathology: Extensive stage small cell lung cancer. Primary Tumor location:  Right upper lobe. Metastatic liver lesions. Surgeries: No. Chemotherapy: Yes; Ongoing? Yes; Most recent administration: 06/18/2022 Immunotherapy?  Yes; Type: Imfinzi,  Tecentriq; Ongoing? No Radiation therapy? Yes; Date Range: 04/12/2020 - 05/02/2020; Target: Right lung Other Cancer Therapies: Talc pleurodesis for recurrent pleural effusion on 01/20/2022. * Tracking Code: BO * EXAM: CT CHEST, ABDOMEN, AND PELVIS WITH CONTRAST TECHNIQUE: Multidetector CT imaging of the chest, abdomen and pelvis was performed following the standard protocol during bolus administration of intravenous contrast. RADIATION DOSE REDUCTION: This exam was performed according to the departmental dose-optimization program which includes automated exposure control, adjustment of the mA and/or kV according to patient size and/or use of iterative reconstruction technique. CONTRAST:  161mL OMNIPAQUE IOHEXOL 300 MG/ML  SOLN COMPARISON:  Most recent CT chest, abdomen and pelvis 04/17/2022. 03/28/2020 PET-CT. FINDINGS:  CT CHEST FINDINGS Cardiovascular: Calcified and noncalcified atheromatous plaque in the thoracic aorta. No aneurysmal dilation. Normal heart size without pericardial effusion. Scattered three-vessel coronary calcifications. Central pulmonary vasculature is unremarkable on venous phase assessment. RIGHT-sided Port-A-Cath terminates at the caval to atrial junction. Mediastinum/Nodes: Esophagus grossly normal. Thoracic inlet structures are normal. No mediastinal adenopathy. No hilar adenopathy. No axillary adenopathy. No internal mammary adenopathy. Lungs/Pleura: RIGHT perihilar consolidative changes and bronchiectasis similar to previous imaging. Nodular area in the RIGHT lung apex 11 mm previously 12 mm. Moderately large RIGHT-sided pleural effusion increased in size. Dense pleural plaques in the RIGHT posterior chest may reflect changes of prior pleurodesis but do not show evidence of nodularity. LEFT chest is clear. Airways are patent with stable mild narrowing of the RIGHT mainstem bronchus. Musculoskeletal: See below for full musculoskeletal details. CT ABDOMEN PELVIS FINDINGS Hepatobiliary: RIGHT  posterior hepatic lobe lesion (image 44/2) 3.8 cm previously 3.5 cm greatest axial dimension. Caudate lesion 2.5 cm previously 4.4 cm greatest axial dimension. Lesion at the boundary of hepatic subsegment IV/V along the periphery of the liver (image 56/2) 3.5 cm previously 3.3 cm greatest axial dimension. No new suspicious hepatic lesions. Portal vein is patent. No pericholecystic stranding or biliary duct dilation. Pancreas: Normal, without mass, inflammation or ductal dilatation. Spleen: Normal. Adrenals/Urinary Tract: Adrenal glands are unremarkable. Symmetric renal enhancement. No sign of hydronephrosis. No suspicious renal lesion or perinephric stranding. Urinary bladder is grossly unremarkable. Cysts of the RIGHT kidney which require no dedicated imaging follow-up are unchanged, largest in the interpolar RIGHT kidney measuring 6.9 cm. Stomach/Bowel: No acute gastrointestinal findings. No sign of bowel obstruction. Scattered colonic diverticulosis. Vascular/Lymphatic: Small periportal lymph nodes are unchanged. Aortic atherosclerosis without aneurysmal dilation. No mesenteric adenopathy. No pelvic adenopathy. Reproductive: Prostatomegaly as before. Other: No ascites.  No pneumoperitoneum. Musculoskeletal: No acute bone finding. No destructive bone process. Spinal degenerative changes. 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 IMPRESSION: 1. RIGHT perihilar consolidative changes and bronchiectasis similar to previous imaging. 2. Nodular area in the RIGHT lung apex 11 mm previously 12 mm. Attention on follow-up. 3. Increased size of RIGHT-sided pleural effusion, mildly increased with moderate to large volume and signs of prior talc pleurodesis. 4. On balance, diminished size of hepatic metastatic disease but with potential slight interval enlargement of 1 of the lesions in the RIGHT hepatic lobe. Suggest attention on follow-up. 5. Mild periportal nodal enlargement is stable. 6. No new lesions. 7. Aortic  atherosclerosis and coronary artery disease. Aortic Atherosclerosis (ICD10-I70.0). Electronically Signed   By: Zetta Bills M.D.   On: 07/04/2022 12:05     ASSESSMENT/PLAN:  This is a very pleasant 71 year old Caucasian male diagnosed with extensive stage (T4, N3, M1C) small cell lung cancer presented with large right upper lobe lung mass in addition to mediastinal and right supraclavicular lymphadenopathy and multiple metastatic liver lesions diagnosed in April 2021.   The patient initially underwent systemic chemotherapy with carboplatin for an AUC of 5 on day 1, etoposide 100 mg per metered squared on days 1, 2, and 3 in addition to [facility] for myeloprotection.  He also received immunotherapy with Imfinzi on day 1 of every chemotherapy cycle.  He is status post 8 cycles.  Starting from cycle #5 he was on single agent immunotherapy with Imfinzi IV every 4 weeks   The patient had evidence of disease progression on his scan from December 2021.  He was then started on systemic chemotherapy with carboplatin for an AUC of 5 on day  1, etoposide 100 mg per metered squared on days 1, 2, and 3 in addition to Tecentriq 1200 mg on day 1, the patient is status post 8 cycles.  Starting from cycle #5, the patient started maintenance immunotherapy with Tecentriq.  This was discontinued due to evidence of disease progression.   The patient then underwent Zepzelca IV every 3 weeks.  He is status post his 12 cycles and he tolerated it well except for fatigue a few days after treatment.  This was discontinued in February 2023 due to evidence of disease progression.    The patient then underwent treatment with irinotecan 65 mg per metered squared on days 1 and 8 IV every 3 weeks.  The patient is status post 3 cycles and tolerated it fairly well without any concerning adverse side effects.  This was discontinued secondary to disease progression.   Dr. Julien Nordmann saw the patient after this to discuss the options including  palliative care and hospice versus single agent Gemzar, or Taxol.  He also discussed repeating his initial treatment with carboplatin and etoposide since he had a good response to treatment in the past..   He was referred to Dr. Mariel Kansky at [facility] for consideration of enrollment in the clinical trial with [email or web address].    He also saw Dr. Mindi Junker at [facility] for consideration of enrollment in the ETCTN [id number] trial.  The patient was not interested due to the travel burden.  The patient was interested in restarting systemic chemotherapy with carboplatin for an AUC of 5 on day 1 and etoposide 100 mg per metered squared on days 1, 2, and 3 with Cosela. He is status post 3 cycles and tolerated it well.    With his last CT scan on 07/04/22, it showed stable disease except for potential slight interval enlargement of 1 liver lesion we will monitor this area closely.  Labs reviewed.  Recommend that he proceed cycle #4 today scheduled.  I will arrange for restaging CT scan of the chest, abdomen, pelvis prior to starting his next cycle treatment.  We will reassess his lung/breathing as well as the liver lesions in his upcoming scan.  We will see him back for follow-up visit in 3 weeks for evaluation and repeat blood work and to review his scan results before starting cycle #5.  The patient's scan shows prostamegaly which could be causing his nocturia.  Encouraged him to talk to his PCP about medications for BPH.  He is supposed to have a PSA drawn soon.  I encouraged him to ask the lab at his lab draw next week to have this performed.  The patient will continue to follow with Dr. Valeta Harms for his COPD and pleural fluid.  We will assess this on his upcoming scan.  The patient has chronic back pain which is unchanged.  He is underwent physical therapy, injections, and other management strategies such as pain medication, Salonpas patches, TENS units, etc.  From reviewing his most recent scan, no signs of  metastatic disease to this area.  Encouraged him to talk to his orthopedic provider about what other interventions could be performed.  Discussed with him that healthy weight loss may also help take some pressure off of his back.  The patient reports he has been trying to lose weight for years but does not have success.   The patient was advised to call immediately if he has any concerning symptoms in the interval. The patient voices understanding of current disease status and treatment  options and is in agreement with the current care plan. All questions were answered. The patient knows to call the clinic with any problems, questions or concerns. We can certainly see the patient much sooner if necessary       Orders Placed This Encounter  Procedures   CT Chest W Contrast    Standing Status:   Future    Standing Expiration Date:   07/30/2023    Order Specific Question:   If indicated for the ordered procedure, I authorize the administration of contrast media per Radiology protocol    Answer:   Yes    Order Specific Question:   Preferred imaging location?    Answer:   [facility]   CT Abdomen Pelvis W Contrast    Standing Status:   Future    Standing Expiration Date:   07/30/2023    Order Specific Question:   If indicated for the ordered procedure, I authorize the administration of contrast media per Radiology protocol    Answer:   Yes    Order Specific Question:   Preferred imaging location?    Answer:   [facility]    Order Specific Question:   Is Oral Contrast requested for this exam?    Answer:   Yes, Per Radiology protocol      The total time spent in the appointment was 20-29 minutes  [place], PA-C 07/30/22

## 2022-07-29 ENCOUNTER — Other Ambulatory Visit: Payer: Self-pay | Admitting: Primary Care

## 2022-07-29 DIAGNOSIS — E119 Type 2 diabetes mellitus without complications: Secondary | ICD-10-CM

## 2022-07-29 MED FILL — Fosaprepitant Dimeglumine For IV Infusion 150 MG (Base Eq): INTRAVENOUS | Qty: 5 | Status: AC

## 2022-07-29 MED FILL — Dexamethasone Sodium Phosphate Inj 100 MG/10ML: INTRAMUSCULAR | Qty: 1 | Status: AC

## 2022-07-30 ENCOUNTER — Inpatient Hospital Stay (HOSPITAL_BASED_OUTPATIENT_CLINIC_OR_DEPARTMENT_OTHER): Payer: Medicare Other | Admitting: Physician Assistant

## 2022-07-30 ENCOUNTER — Other Ambulatory Visit: Payer: Self-pay

## 2022-07-30 ENCOUNTER — Inpatient Hospital Stay: Payer: Medicare Other

## 2022-07-30 ENCOUNTER — Encounter: Payer: Self-pay | Admitting: Physician Assistant

## 2022-07-30 VITALS — BP 153/73 | HR 91 | Temp 98.0°F | Resp 17 | Wt 295.3 lb

## 2022-07-30 DIAGNOSIS — J449 Chronic obstructive pulmonary disease, unspecified: Secondary | ICD-10-CM | POA: Diagnosis not present

## 2022-07-30 DIAGNOSIS — C3411 Malignant neoplasm of upper lobe, right bronchus or lung: Secondary | ICD-10-CM

## 2022-07-30 DIAGNOSIS — Z5111 Encounter for antineoplastic chemotherapy: Secondary | ICD-10-CM | POA: Diagnosis not present

## 2022-07-30 DIAGNOSIS — Z95828 Presence of other vascular implants and grafts: Secondary | ICD-10-CM

## 2022-07-30 DIAGNOSIS — R351 Nocturia: Secondary | ICD-10-CM | POA: Diagnosis not present

## 2022-07-30 DIAGNOSIS — N401 Enlarged prostate with lower urinary tract symptoms: Secondary | ICD-10-CM | POA: Diagnosis not present

## 2022-07-30 DIAGNOSIS — C787 Secondary malignant neoplasm of liver and intrahepatic bile duct: Secondary | ICD-10-CM | POA: Diagnosis not present

## 2022-07-30 LAB — CBC WITH DIFFERENTIAL (CANCER CENTER ONLY)
Abs Immature Granulocytes: 0.04 10*3/uL (ref 0.00–0.07)
Basophils Absolute: 0 10*3/uL (ref 0.0–0.1)
Basophils Relative: 1 %
Eosinophils Absolute: 0.1 10*3/uL (ref 0.0–0.5)
Eosinophils Relative: 2 %
HCT: 30.4 % — ABNORMAL LOW (ref 39.0–52.0)
Hemoglobin: 9.9 g/dL — ABNORMAL LOW (ref 13.0–17.0)
Immature Granulocytes: 1 %
Lymphocytes Relative: 15 %
Lymphs Abs: 0.5 10*3/uL — ABNORMAL LOW (ref 0.7–4.0)
MCH: 29.2 pg (ref 26.0–34.0)
MCHC: 32.6 g/dL (ref 30.0–36.0)
MCV: 89.7 fL (ref 80.0–100.0)
Monocytes Absolute: 0.5 10*3/uL (ref 0.1–1.0)
Monocytes Relative: 15 %
Neutro Abs: 2 10*3/uL (ref 1.7–7.7)
Neutrophils Relative %: 66 %
Platelet Count: 184 10*3/uL (ref 150–400)
RBC: 3.39 MIL/uL — ABNORMAL LOW (ref 4.22–5.81)
RDW: 18.6 % — ABNORMAL HIGH (ref 11.5–15.5)
WBC Count: 3.1 10*3/uL — ABNORMAL LOW (ref 4.0–10.5)
nRBC: 0.6 % — ABNORMAL HIGH (ref 0.0–0.2)

## 2022-07-30 LAB — CMP (CANCER CENTER ONLY)
ALT: 16 U/L (ref 0–44)
AST: 19 U/L (ref 15–41)
Albumin: 4 g/dL (ref 3.5–5.0)
Alkaline Phosphatase: 65 U/L (ref 38–126)
Anion gap: 6 (ref 5–15)
BUN: 14 mg/dL (ref 8–23)
CO2: 28 mmol/L (ref 22–32)
Calcium: 8.9 mg/dL (ref 8.9–10.3)
Chloride: 105 mmol/L (ref 98–111)
Creatinine: 1.03 mg/dL (ref 0.61–1.24)
GFR, Estimated: >60 mL/min (ref >60)
Glucose, Bld: 181 mg/dL — ABNORMAL HIGH (ref 70–99)
Potassium: 4 mmol/L (ref 3.5–5.1)
Sodium: 139 mmol/L (ref 135–145)
Total Bilirubin: 0.4 mg/dL (ref 0.3–1.2)
Total Protein: 6.5 g/dL (ref 6.5–8.1)

## 2022-07-30 MED ORDER — SODIUM CHLORIDE 0.9 % IV SOLN
10.0000 mg | Freq: Once | INTRAVENOUS | Status: AC
  Administered 2022-07-30: 10 mg via INTRAVENOUS
  Filled 2022-07-30: qty 10

## 2022-07-30 MED ORDER — CETIRIZINE HCL 10 MG/ML IV SOLN
10.0000 mg | Freq: Once | INTRAVENOUS | Status: AC
  Administered 2022-07-30: 10 mg via INTRAVENOUS
  Filled 2022-07-30: qty 1

## 2022-07-30 MED ORDER — FAMOTIDINE IN NACL 20-0.9 MG/50ML-% IV SOLN
INTRAVENOUS | Status: AC
  Filled 2022-07-30: qty 50

## 2022-07-30 MED ORDER — SODIUM CHLORIDE 0.9 % IV SOLN: Freq: Once | INTRAVENOUS | Status: AC

## 2022-07-30 MED ORDER — SODIUM CHLORIDE 0.9 % IV SOLN
150.0000 mg | Freq: Once | INTRAVENOUS | Status: AC
  Administered 2022-07-30: 150 mg via INTRAVENOUS
  Filled 2022-07-30: qty 150

## 2022-07-30 MED ORDER — SODIUM CHLORIDE 0.9 % IV SOLN
682.5000 mg | Freq: Once | INTRAVENOUS | Status: AC
  Administered 2022-07-30: 680 mg via INTRAVENOUS
  Filled 2022-07-30: qty 68

## 2022-07-30 MED ORDER — SODIUM CHLORIDE 0.9% FLUSH
10.0000 mL | Freq: Once | INTRAVENOUS | Status: AC
  Administered 2022-07-30: 10 mL

## 2022-07-30 MED ORDER — SODIUM CHLORIDE 0.9 % IV SOLN
100.0000 mg/m2 | Freq: Once | INTRAVENOUS | Status: AC
  Administered 2022-07-30: 260 mg via INTRAVENOUS
  Filled 2022-07-30: qty 13

## 2022-07-30 MED ORDER — PALONOSETRON HCL INJECTION 0.25 MG/5ML
0.2500 mg | Freq: Once | INTRAVENOUS | Status: AC
  Administered 2022-07-30: 0.25 mg via INTRAVENOUS

## 2022-07-30 MED ORDER — SODIUM CHLORIDE 0.9% FLUSH
10.0000 mL | INTRAVENOUS | Status: DC | PRN
  Administered 2022-07-30: 10 mL

## 2022-07-30 MED ORDER — PALONOSETRON HCL INJECTION 0.25 MG/5ML
INTRAVENOUS | Status: AC
  Filled 2022-07-30: qty 5

## 2022-07-30 MED ORDER — HEPARIN SOD (PORK) LOCK FLUSH 100 UNIT/ML IV SOLN
500.0000 [IU] | Freq: Once | INTRAVENOUS | Status: AC | PRN
  Administered 2022-07-30: 500 [IU]

## 2022-07-30 MED ORDER — TRILACICLIB DIHYDROCHLORIDE INJECTION 300 MG
240.0000 mg/m2 | Freq: Once | INTRAVENOUS | Status: AC
  Administered 2022-07-30: 600 mg via INTRAVENOUS
  Filled 2022-07-30: qty 40

## 2022-07-30 MED ORDER — FAMOTIDINE IN NACL 20-0.9 MG/50ML-% IV SOLN
20.0000 mg | Freq: Once | INTRAVENOUS | Status: AC
  Administered 2022-07-30: 20 mg via INTRAVENOUS

## 2022-07-30 MED FILL — Dexamethasone Sodium Phosphate Inj 100 MG/10ML: INTRAMUSCULAR | Qty: 1 | Status: AC

## 2022-07-30 NOTE — Patient Instructions (Signed)
[facility]  Discharge Instructions: Thank you for choosing [facility] to provide your oncology and hematology care.   If you have a lab appointment with the [facility], please go directly to the [facility] and check in at the registration area.   Wear comfortable clothing and clothing appropriate for easy access to any Portacath or PICC line.   We strive to give you quality time with your provider. You may need to reschedule your appointment if you arrive late (15 or more minutes).  Arriving late affects you and other patients whose appointments are after yours.  Also, if you miss three or more appointments without notifying the office, you may be dismissed from the clinic at the provider's discretion.      For prescription refill requests, have your pharmacy contact our office and allow 72 hours for refills to be completed.    Today you received the following chemotherapy and/or immunotherapy agent: Carboplatin and Etoposide.     To help prevent nausea and vomiting after your treatment, we encourage you to take your nausea medication as directed.  BELOW ARE SYMPTOMS THAT SHOULD BE REPORTED IMMEDIATELY: *FEVER GREATER THAN 100.4 F (38 C) OR HIGHER *CHILLS OR SWEATING *NAUSEA AND VOMITING THAT IS NOT CONTROLLED WITH YOUR NAUSEA MEDICATION *UNUSUAL SHORTNESS OF BREATH *UNUSUAL BRUISING OR BLEEDING *URINARY PROBLEMS (pain or burning when urinating, or frequent urination) *BOWEL PROBLEMS (unusual diarrhea, constipation, pain near the anus) TENDERNESS IN MOUTH AND THROAT WITH OR WITHOUT PRESENCE OF ULCERS (sore throat, sores in mouth, or a toothache) UNUSUAL RASH, SWELLING OR PAIN  UNUSUAL VAGINAL DISCHARGE OR ITCHING   Items with * indicate a potential emergency and should be followed up as soon as possible or go to the Emergency Department if any problems should occur.  Please show the CHEMOTHERAPY ALERT CARD or IMMUNOTHERAPY ALERT CARD  at check-in to the Emergency Department and triage nurse.  Should you have questions after your visit or need to cancel or reschedule your appointment, please contact [facility]  Dept: [phone]  and follow the prompts.  Office hours are 8:00 a.m. to 4:30 p.m. Monday - Friday. Please note that voicemails left after 4:00 p.m. may not be returned until the following business day.  We are closed weekends and major holidays. You have access to a nurse at all times for urgent questions. Please call the main number to the clinic Dept: [phone] and follow the prompts.   For any non-urgent questions, you may also contact your provider using MyChart. We now offer e-Visits for anyone 72 and older to request care online for non-urgent symptoms. For details visit [email or web address].   Also download the MyChart app! Go to the app store, search "MyChart", open the app, select [facility], and log in with your MyChart username and password.  Due to Covid, a mask is required upon entering the hospital/clinic. If you do not have a mask, one will be given to you upon arrival. For doctor visits, patients may have 1 support person aged 72 or older with them. For treatment visits, patients cannot have anyone with them due to current Covid guidelines and our immunocompromised population.

## 2022-07-31 ENCOUNTER — Inpatient Hospital Stay: Payer: Medicare Other

## 2022-07-31 VITALS — BP 133/80 | HR 95 | Temp 97.9°F | Resp 18

## 2022-07-31 DIAGNOSIS — R351 Nocturia: Secondary | ICD-10-CM | POA: Diagnosis not present

## 2022-07-31 DIAGNOSIS — C3411 Malignant neoplasm of upper lobe, right bronchus or lung: Secondary | ICD-10-CM | POA: Diagnosis not present

## 2022-07-31 DIAGNOSIS — Z5111 Encounter for antineoplastic chemotherapy: Secondary | ICD-10-CM | POA: Diagnosis not present

## 2022-07-31 DIAGNOSIS — J449 Chronic obstructive pulmonary disease, unspecified: Secondary | ICD-10-CM | POA: Diagnosis not present

## 2022-07-31 DIAGNOSIS — C787 Secondary malignant neoplasm of liver and intrahepatic bile duct: Secondary | ICD-10-CM | POA: Diagnosis not present

## 2022-07-31 DIAGNOSIS — N401 Enlarged prostate with lower urinary tract symptoms: Secondary | ICD-10-CM | POA: Diagnosis not present

## 2022-07-31 MED ORDER — TRILACICLIB DIHYDROCHLORIDE INJECTION 300 MG
240.0000 mg/m2 | Freq: Once | INTRAVENOUS | Status: AC
  Administered 2022-07-31: 600 mg via INTRAVENOUS
  Filled 2022-07-31: qty 40

## 2022-07-31 MED ORDER — HEPARIN SOD (PORK) LOCK FLUSH 100 UNIT/ML IV SOLN
500.0000 [IU] | Freq: Once | INTRAVENOUS | Status: AC | PRN
  Administered 2022-07-31: 500 [IU]

## 2022-07-31 MED ORDER — SODIUM CHLORIDE 0.9 % IV SOLN
100.0000 mg/m2 | Freq: Once | INTRAVENOUS | Status: AC
  Administered 2022-07-31: 260 mg via INTRAVENOUS
  Filled 2022-07-31: qty 13

## 2022-07-31 MED ORDER — SODIUM CHLORIDE 0.9% FLUSH
10.0000 mL | INTRAVENOUS | Status: DC | PRN
  Administered 2022-07-31: 10 mL

## 2022-07-31 MED ORDER — SODIUM CHLORIDE 0.9 % IV SOLN
10.0000 mg | Freq: Once | INTRAVENOUS | Status: AC
  Administered 2022-07-31: 10 mg via INTRAVENOUS
  Filled 2022-07-31: qty 10

## 2022-07-31 MED ORDER — SODIUM CHLORIDE 0.9 % IV SOLN: Freq: Once | INTRAVENOUS | Status: AC

## 2022-07-31 MED FILL — Dexamethasone Sodium Phosphate Inj 100 MG/10ML: INTRAMUSCULAR | Qty: 1 | Status: AC

## 2022-07-31 NOTE — Patient Instructions (Signed)
[facility]  Discharge Instructions: Thank you for choosing [facility] to provide your oncology and hematology care.   If you have a lab appointment with the [facility], please go directly to the [facility] and check in at the registration area.   Wear comfortable clothing and clothing appropriate for easy access to any Portacath or PICC line.   We strive to give you quality time with your provider. You may need to reschedule your appointment if you arrive late (15 or more minutes).  Arriving late affects you and other patients whose appointments are after yours.  Also, if you miss three or more appointments without notifying the office, you may be dismissed from the clinic at the provider's discretion.      For prescription refill requests, have your pharmacy contact our office and allow 72 hours for refills to be completed.    Today you received the following chemotherapy and/or immunotherapy agents : Cosela, Etoposide      To help prevent nausea and vomiting after your treatment, we encourage you to take your nausea medication as directed.  BELOW ARE SYMPTOMS THAT SHOULD BE REPORTED IMMEDIATELY: *FEVER GREATER THAN 100.4 F (38 C) OR HIGHER *CHILLS OR SWEATING *NAUSEA AND VOMITING THAT IS NOT CONTROLLED WITH YOUR NAUSEA MEDICATION *UNUSUAL SHORTNESS OF BREATH *UNUSUAL BRUISING OR BLEEDING *URINARY PROBLEMS (pain or burning when urinating, or frequent urination) *BOWEL PROBLEMS (unusual diarrhea, constipation, pain near the anus) TENDERNESS IN MOUTH AND THROAT WITH OR WITHOUT PRESENCE OF ULCERS (sore throat, sores in mouth, or a toothache) UNUSUAL RASH, SWELLING OR PAIN  UNUSUAL VAGINAL DISCHARGE OR ITCHING   Items with * indicate a potential emergency and should be followed up as soon as possible or go to the Emergency Department if any problems should occur.  Please show the CHEMOTHERAPY ALERT CARD or IMMUNOTHERAPY ALERT CARD at  check-in to the Emergency Department and triage nurse.  Should you have questions after your visit or need to cancel or reschedule your appointment, please contact [facility]  Dept: [phone]  and follow the prompts.  Office hours are 8:00 a.m. to 4:30 p.m. Monday - Friday. Please note that voicemails left after 4:00 p.m. may not be returned until the following business day.  We are closed weekends and major holidays. You have access to a nurse at all times for urgent questions. Please call the main number to the clinic Dept: [phone] and follow the prompts.   For any non-urgent questions, you may also contact your provider using MyChart. We now offer e-Visits for anyone 71 and older to request care online for non-urgent symptoms. For details visit [email or web address].   Also download the MyChart app! Go to the app store, search "MyChart", open the app, select [facility], and log in with your MyChart username and password.  Masks are optional in the cancer centers. If you would like for your care team to wear a mask while they are taking care of you, please let them know. For doctor visits, patients may have with them one support Tanyon Alipio who is at least 72 years old. At this time, visitors are not allowed in the infusion area.

## 2022-08-01 ENCOUNTER — Inpatient Hospital Stay: Payer: Medicare Other

## 2022-08-01 ENCOUNTER — Other Ambulatory Visit: Payer: Self-pay

## 2022-08-01 VITALS — BP 134/79 | HR 94 | Temp 98.0°F | Resp 18

## 2022-08-01 DIAGNOSIS — C787 Secondary malignant neoplasm of liver and intrahepatic bile duct: Secondary | ICD-10-CM | POA: Diagnosis not present

## 2022-08-01 DIAGNOSIS — C3411 Malignant neoplasm of upper lobe, right bronchus or lung: Secondary | ICD-10-CM | POA: Diagnosis not present

## 2022-08-01 DIAGNOSIS — N401 Enlarged prostate with lower urinary tract symptoms: Secondary | ICD-10-CM | POA: Diagnosis not present

## 2022-08-01 DIAGNOSIS — R351 Nocturia: Secondary | ICD-10-CM | POA: Diagnosis not present

## 2022-08-01 DIAGNOSIS — J449 Chronic obstructive pulmonary disease, unspecified: Secondary | ICD-10-CM | POA: Diagnosis not present

## 2022-08-01 DIAGNOSIS — Z5111 Encounter for antineoplastic chemotherapy: Secondary | ICD-10-CM | POA: Diagnosis not present

## 2022-08-01 MED ORDER — SODIUM CHLORIDE 0.9% FLUSH
10.0000 mL | INTRAVENOUS | Status: DC | PRN
  Administered 2022-08-01: 10 mL

## 2022-08-01 MED ORDER — SODIUM CHLORIDE 0.9 % IV SOLN: Freq: Once | INTRAVENOUS | Status: AC

## 2022-08-01 MED ORDER — SODIUM CHLORIDE 0.9 % IV SOLN
100.0000 mg/m2 | Freq: Once | INTRAVENOUS | Status: AC
  Administered 2022-08-01: 260 mg via INTRAVENOUS
  Filled 2022-08-01: qty 13

## 2022-08-01 MED ORDER — TRILACICLIB DIHYDROCHLORIDE INJECTION 300 MG
240.0000 mg/m2 | Freq: Once | INTRAVENOUS | Status: AC
  Administered 2022-08-01: 600 mg via INTRAVENOUS
  Filled 2022-08-01: qty 40

## 2022-08-01 MED ORDER — SODIUM CHLORIDE 0.9 % IV SOLN
10.0000 mg | Freq: Once | INTRAVENOUS | Status: AC
  Administered 2022-08-01: 10 mg via INTRAVENOUS
  Filled 2022-08-01: qty 10

## 2022-08-01 MED ORDER — HEPARIN SOD (PORK) LOCK FLUSH 100 UNIT/ML IV SOLN
500.0000 [IU] | Freq: Once | INTRAVENOUS | Status: AC | PRN
  Administered 2022-08-01: 500 [IU]

## 2022-08-01 NOTE — Patient Instructions (Signed)
[facility]  Discharge Instructions: Thank you for choosing [facility] to provide your oncology and hematology care.   If you have a lab appointment with the [facility], please go directly to the [facility] and check in at the registration area.   Wear comfortable clothing and clothing appropriate for easy access to any Portacath or PICC line.   We strive to give you quality time with your provider. You may need to reschedule your appointment if you arrive late (15 or more minutes).  Arriving late affects you and other patients whose appointments are after yours.  Also, if you miss three or more appointments without notifying the office, you may be dismissed from the clinic at the provider's discretion.      For prescription refill requests, have your pharmacy contact our office and allow 72 hours for refills to be completed.    Today you received the following chemotherapy and/or immunotherapy agents : Cosela, Etoposide      To help prevent nausea and vomiting after your treatment, we encourage you to take your nausea medication as directed.  BELOW ARE SYMPTOMS THAT SHOULD BE REPORTED IMMEDIATELY: *FEVER GREATER THAN 100.4 F (38 C) OR HIGHER *CHILLS OR SWEATING *NAUSEA AND VOMITING THAT IS NOT CONTROLLED WITH YOUR NAUSEA MEDICATION *UNUSUAL SHORTNESS OF BREATH *UNUSUAL BRUISING OR BLEEDING *URINARY PROBLEMS (pain or burning when urinating, or frequent urination) *BOWEL PROBLEMS (unusual diarrhea, constipation, pain near the anus) TENDERNESS IN MOUTH AND THROAT WITH OR WITHOUT PRESENCE OF ULCERS (sore throat, sores in mouth, or a toothache) UNUSUAL RASH, SWELLING OR PAIN  UNUSUAL VAGINAL DISCHARGE OR ITCHING   Items with * indicate a potential emergency and should be followed up as soon as possible or go to the Emergency Department if any problems should occur.  Please show the CHEMOTHERAPY ALERT CARD or IMMUNOTHERAPY ALERT CARD at  check-in to the Emergency Department and triage nurse.  Should you have questions after your visit or need to cancel or reschedule your appointment, please contact [facility]  Dept: [phone]  and follow the prompts.  Office hours are 8:00 a.m. to 4:30 p.m. Monday - Friday. Please note that voicemails left after 4:00 p.m. may not be returned until the following business day.  We are closed weekends and major holidays. You have access to a nurse at all times for urgent questions. Please call the main number to the clinic Dept: [phone] and follow the prompts.   For any non-urgent questions, you may also contact your provider using MyChart. We now offer e-Visits for anyone 43 and older to request care online for non-urgent symptoms. For details visit [email or web address].   Also download the MyChart app! Go to the app store, search "MyChart", open the app, select [facility], and log in with your MyChart username and password.  Masks are optional in the cancer centers. If you would like for your care team to wear a mask while they are taking care of you, please let them know. For doctor visits, patients may have with them one support Adam Wise who is at least 72 years old. At this time, visitors are not allowed in the infusion area.

## 2022-08-06 ENCOUNTER — Other Ambulatory Visit: Payer: Self-pay

## 2022-08-06 ENCOUNTER — Encounter: Payer: Self-pay | Admitting: Pulmonary Disease

## 2022-08-06 ENCOUNTER — Inpatient Hospital Stay: Payer: Medicare Other

## 2022-08-06 DIAGNOSIS — C3411 Malignant neoplasm of upper lobe, right bronchus or lung: Secondary | ICD-10-CM

## 2022-08-06 DIAGNOSIS — Z5111 Encounter for antineoplastic chemotherapy: Secondary | ICD-10-CM | POA: Diagnosis not present

## 2022-08-06 DIAGNOSIS — R351 Nocturia: Secondary | ICD-10-CM | POA: Diagnosis not present

## 2022-08-06 DIAGNOSIS — N401 Enlarged prostate with lower urinary tract symptoms: Secondary | ICD-10-CM | POA: Diagnosis not present

## 2022-08-06 DIAGNOSIS — J449 Chronic obstructive pulmonary disease, unspecified: Secondary | ICD-10-CM | POA: Diagnosis not present

## 2022-08-06 DIAGNOSIS — C787 Secondary malignant neoplasm of liver and intrahepatic bile duct: Secondary | ICD-10-CM | POA: Diagnosis not present

## 2022-08-06 LAB — CBC WITH DIFFERENTIAL (CANCER CENTER ONLY)
Abs Immature Granulocytes: 0.05 10*3/uL (ref 0.00–0.07)
Basophils Absolute: 0 10*3/uL (ref 0.0–0.1)
Basophils Relative: 1 %
Eosinophils Absolute: 0.1 10*3/uL (ref 0.0–0.5)
Eosinophils Relative: 1 %
HCT: 28.9 % — ABNORMAL LOW (ref 39.0–52.0)
Hemoglobin: 9.5 g/dL — ABNORMAL LOW (ref 13.0–17.0)
Immature Granulocytes: 1 %
Lymphocytes Relative: 14 %
Lymphs Abs: 0.5 10*3/uL — ABNORMAL LOW (ref 0.7–4.0)
MCH: 29.6 pg (ref 26.0–34.0)
MCHC: 32.9 g/dL (ref 30.0–36.0)
MCV: 90 fL (ref 80.0–100.0)
Monocytes Absolute: 0.1 10*3/uL (ref 0.1–1.0)
Monocytes Relative: 3 %
Neutro Abs: 2.9 10*3/uL (ref 1.7–7.7)
Neutrophils Relative %: 80 %
Platelet Count: 152 10*3/uL (ref 150–400)
RBC: 3.21 MIL/uL — ABNORMAL LOW (ref 4.22–5.81)
RDW: 17.2 % — ABNORMAL HIGH (ref 11.5–15.5)
Smear Review: NORMAL
WBC Count: 3.6 10*3/uL — ABNORMAL LOW (ref 4.0–10.5)
nRBC: 0 % (ref 0.0–0.2)

## 2022-08-06 LAB — CMP (CANCER CENTER ONLY)
ALT: 18 U/L (ref 0–44)
AST: 19 U/L (ref 15–41)
Albumin: 4.1 g/dL (ref 3.5–5.0)
Alkaline Phosphatase: 67 U/L (ref 38–126)
Anion gap: 8 (ref 5–15)
BUN: 25 mg/dL — ABNORMAL HIGH (ref 8–23)
CO2: 30 mmol/L (ref 22–32)
Calcium: 9.2 mg/dL (ref 8.9–10.3)
Chloride: 104 mmol/L (ref 98–111)
Creatinine: 0.96 mg/dL (ref 0.61–1.24)
GFR, Estimated: >60 mL/min (ref >60)
Glucose, Bld: 158 mg/dL — ABNORMAL HIGH (ref 70–99)
Potassium: 5 mmol/L (ref 3.5–5.1)
Sodium: 142 mmol/L (ref 135–145)
Total Bilirubin: 0.4 mg/dL (ref 0.3–1.2)
Total Protein: 6.3 g/dL — ABNORMAL LOW (ref 6.5–8.1)

## 2022-08-13 ENCOUNTER — Inpatient Hospital Stay: Payer: Medicare Other

## 2022-08-13 DIAGNOSIS — Z5111 Encounter for antineoplastic chemotherapy: Secondary | ICD-10-CM | POA: Diagnosis not present

## 2022-08-13 DIAGNOSIS — N401 Enlarged prostate with lower urinary tract symptoms: Secondary | ICD-10-CM | POA: Diagnosis not present

## 2022-08-13 DIAGNOSIS — J449 Chronic obstructive pulmonary disease, unspecified: Secondary | ICD-10-CM | POA: Diagnosis not present

## 2022-08-13 DIAGNOSIS — C3411 Malignant neoplasm of upper lobe, right bronchus or lung: Secondary | ICD-10-CM | POA: Diagnosis not present

## 2022-08-13 DIAGNOSIS — C787 Secondary malignant neoplasm of liver and intrahepatic bile duct: Secondary | ICD-10-CM | POA: Diagnosis not present

## 2022-08-13 DIAGNOSIS — R351 Nocturia: Secondary | ICD-10-CM | POA: Diagnosis not present

## 2022-08-13 LAB — CMP (CANCER CENTER ONLY)
ALT: 18 U/L (ref 0–44)
AST: 20 U/L (ref 15–41)
Albumin: 4.1 g/dL (ref 3.5–5.0)
Alkaline Phosphatase: 63 U/L (ref 38–126)
Anion gap: 8 (ref 5–15)
BUN: 17 mg/dL (ref 8–23)
CO2: 28 mmol/L (ref 22–32)
Calcium: 8.9 mg/dL (ref 8.9–10.3)
Chloride: 105 mmol/L (ref 98–111)
Creatinine: 1.07 mg/dL (ref 0.61–1.24)
GFR, Estimated: >60 mL/min (ref >60)
Glucose, Bld: 129 mg/dL — ABNORMAL HIGH (ref 70–99)
Potassium: 4.9 mmol/L (ref 3.5–5.1)
Sodium: 141 mmol/L (ref 135–145)
Total Bilirubin: 0.4 mg/dL (ref 0.3–1.2)
Total Protein: 6.5 g/dL (ref 6.5–8.1)

## 2022-08-13 LAB — CBC WITH DIFFERENTIAL (CANCER CENTER ONLY)
Abs Immature Granulocytes: 0 10*3/uL (ref 0.00–0.07)
Basophils Absolute: 0 10*3/uL (ref 0.0–0.1)
Basophils Relative: 1 %
Eosinophils Absolute: 0 10*3/uL (ref 0.0–0.5)
Eosinophils Relative: 2 %
HCT: 28.4 % — ABNORMAL LOW (ref 39.0–52.0)
Hemoglobin: 9.1 g/dL — ABNORMAL LOW (ref 13.0–17.0)
Immature Granulocytes: 0 %
Lymphocytes Relative: 31 %
Lymphs Abs: 0.6 10*3/uL — ABNORMAL LOW (ref 0.7–4.0)
MCH: 29.2 pg (ref 26.0–34.0)
MCHC: 32 g/dL (ref 30.0–36.0)
MCV: 91 fL (ref 80.0–100.0)
Monocytes Absolute: 0.4 10*3/uL (ref 0.1–1.0)
Monocytes Relative: 22 %
Neutro Abs: 0.8 10*3/uL — ABNORMAL LOW (ref 1.7–7.7)
Neutrophils Relative %: 44 %
Platelet Count: 54 10*3/uL — ABNORMAL LOW (ref 150–400)
RBC: 3.12 MIL/uL — ABNORMAL LOW (ref 4.22–5.81)
RDW: 18.2 % — ABNORMAL HIGH (ref 11.5–15.5)
WBC Count: 1.9 10*3/uL — ABNORMAL LOW (ref 4.0–10.5)
nRBC: 0 % (ref 0.0–0.2)

## 2022-08-15 ENCOUNTER — Ambulatory Visit (HOSPITAL_COMMUNITY)
Admission: RE | Admit: 2022-08-15 | Discharge: 2022-08-15 | Disposition: A | Discharge status: Home / Self Care | Payer: Medicare Other | Source: Ambulatory Visit | Attending: Physician Assistant | Admitting: Physician Assistant

## 2022-08-15 DIAGNOSIS — J90 Pleural effusion, not elsewhere classified: Secondary | ICD-10-CM | POA: Diagnosis not present

## 2022-08-15 DIAGNOSIS — R918 Other nonspecific abnormal finding of lung field: Secondary | ICD-10-CM | POA: Diagnosis not present

## 2022-08-15 DIAGNOSIS — N281 Cyst of kidney, acquired: Secondary | ICD-10-CM | POA: Diagnosis not present

## 2022-08-15 DIAGNOSIS — C3490 Malignant neoplasm of unspecified part of unspecified bronchus or lung: Secondary | ICD-10-CM | POA: Diagnosis not present

## 2022-08-15 DIAGNOSIS — C787 Secondary malignant neoplasm of liver and intrahepatic bile duct: Secondary | ICD-10-CM | POA: Diagnosis not present

## 2022-08-15 DIAGNOSIS — C3411 Malignant neoplasm of upper lobe, right bronchus or lung: Secondary | ICD-10-CM | POA: Insufficient documentation

## 2022-08-15 MED ORDER — SODIUM CHLORIDE (PF) 0.9 % IJ SOLN
INTRAMUSCULAR | Status: AC
  Filled 2022-08-15: qty 50

## 2022-08-15 MED ORDER — IOHEXOL 300 MG/ML  SOLN
100.0000 mL | Freq: Once | INTRAMUSCULAR | Status: AC | PRN
  Administered 2022-08-15: 100 mL via INTRAVENOUS

## 2022-08-15 MED ORDER — HEPARIN SOD (PORK) LOCK FLUSH 100 UNIT/ML IV SOLN
500.0000 [IU] | Freq: Once | INTRAVENOUS | Status: AC
  Administered 2022-08-15: 500 [IU] via INTRAVENOUS

## 2022-08-20 ENCOUNTER — Other Ambulatory Visit: Payer: Self-pay

## 2022-08-20 ENCOUNTER — Inpatient Hospital Stay: Payer: Medicare Other | Attending: Internal Medicine

## 2022-08-20 ENCOUNTER — Inpatient Hospital Stay: Payer: Medicare Other

## 2022-08-20 ENCOUNTER — Inpatient Hospital Stay (HOSPITAL_BASED_OUTPATIENT_CLINIC_OR_DEPARTMENT_OTHER): Payer: Medicare Other | Admitting: Internal Medicine

## 2022-08-20 ENCOUNTER — Encounter: Payer: Self-pay | Admitting: Internal Medicine

## 2022-08-20 VITALS — BP 124/75 | HR 82 | Resp 18

## 2022-08-20 VITALS — BP 139/73 | HR 100 | Temp 98.0°F | Resp 16 | Wt 298.5 lb

## 2022-08-20 DIAGNOSIS — C787 Secondary malignant neoplasm of liver and intrahepatic bile duct: Secondary | ICD-10-CM | POA: Insufficient documentation

## 2022-08-20 DIAGNOSIS — R2689 Other abnormalities of gait and mobility: Secondary | ICD-10-CM | POA: Diagnosis not present

## 2022-08-20 DIAGNOSIS — R519 Headache, unspecified: Secondary | ICD-10-CM | POA: Insufficient documentation

## 2022-08-20 DIAGNOSIS — R42 Dizziness and giddiness: Secondary | ICD-10-CM | POA: Diagnosis not present

## 2022-08-20 DIAGNOSIS — Z79899 Other long term (current) drug therapy: Secondary | ICD-10-CM | POA: Diagnosis not present

## 2022-08-20 DIAGNOSIS — M25511 Pain in right shoulder: Secondary | ICD-10-CM | POA: Diagnosis not present

## 2022-08-20 DIAGNOSIS — Z5111 Encounter for antineoplastic chemotherapy: Secondary | ICD-10-CM | POA: Insufficient documentation

## 2022-08-20 DIAGNOSIS — J90 Pleural effusion, not elsewhere classified: Secondary | ICD-10-CM | POA: Diagnosis not present

## 2022-08-20 DIAGNOSIS — C3411 Malignant neoplasm of upper lobe, right bronchus or lung: Secondary | ICD-10-CM

## 2022-08-20 DIAGNOSIS — D649 Anemia, unspecified: Secondary | ICD-10-CM | POA: Diagnosis not present

## 2022-08-20 DIAGNOSIS — K5900 Constipation, unspecified: Secondary | ICD-10-CM | POA: Diagnosis not present

## 2022-08-20 DIAGNOSIS — Z95828 Presence of other vascular implants and grafts: Secondary | ICD-10-CM

## 2022-08-20 LAB — CBC WITH DIFFERENTIAL (CANCER CENTER ONLY)
Abs Immature Granulocytes: 0.03 10*3/uL (ref 0.00–0.07)
Basophils Absolute: 0 10*3/uL (ref 0.0–0.1)
Basophils Relative: 0 %
Eosinophils Absolute: 0 10*3/uL (ref 0.0–0.5)
Eosinophils Relative: 2 %
HCT: 27.7 % — ABNORMAL LOW (ref 39.0–52.0)
Hemoglobin: 8.9 g/dL — ABNORMAL LOW (ref 13.0–17.0)
Immature Granulocytes: 1 %
Lymphocytes Relative: 21 %
Lymphs Abs: 0.5 10*3/uL — ABNORMAL LOW (ref 0.7–4.0)
MCH: 30.3 pg (ref 26.0–34.0)
MCHC: 32.1 g/dL (ref 30.0–36.0)
MCV: 94.2 fL (ref 80.0–100.0)
Monocytes Absolute: 0.5 10*3/uL (ref 0.1–1.0)
Monocytes Relative: 21 %
Neutro Abs: 1.2 10*3/uL — ABNORMAL LOW (ref 1.7–7.7)
Neutrophils Relative %: 55 %
Platelet Count: 157 10*3/uL (ref 150–400)
RBC: 2.94 MIL/uL — ABNORMAL LOW (ref 4.22–5.81)
RDW: 20 % — ABNORMAL HIGH (ref 11.5–15.5)
WBC Count: 2.3 10*3/uL — ABNORMAL LOW (ref 4.0–10.5)
nRBC: 0.9 % — ABNORMAL HIGH (ref 0.0–0.2)

## 2022-08-20 LAB — CMP (CANCER CENTER ONLY)
ALT: 14 U/L (ref 0–44)
AST: 18 U/L (ref 15–41)
Albumin: 4.1 g/dL (ref 3.5–5.0)
Alkaline Phosphatase: 60 U/L (ref 38–126)
Anion gap: 6 (ref 5–15)
BUN: 17 mg/dL (ref 8–23)
CO2: 28 mmol/L (ref 22–32)
Calcium: 8.7 mg/dL — ABNORMAL LOW (ref 8.9–10.3)
Chloride: 106 mmol/L (ref 98–111)
Creatinine: 1.02 mg/dL (ref 0.61–1.24)
GFR, Estimated: >60 mL/min (ref >60)
Glucose, Bld: 108 mg/dL — ABNORMAL HIGH (ref 70–99)
Potassium: 4.1 mmol/L (ref 3.5–5.1)
Sodium: 140 mmol/L (ref 135–145)
Total Bilirubin: 0.3 mg/dL (ref 0.3–1.2)
Total Protein: 6.5 g/dL (ref 6.5–8.1)

## 2022-08-20 MED ORDER — SODIUM CHLORIDE 0.9 % IV SOLN
150.0000 mg | Freq: Once | INTRAVENOUS | Status: AC
  Administered 2022-08-20: 150 mg via INTRAVENOUS
  Filled 2022-08-20: qty 150

## 2022-08-20 MED ORDER — SODIUM CHLORIDE 0.9 % IV SOLN: Freq: Once | INTRAVENOUS | Status: AC

## 2022-08-20 MED ORDER — SODIUM CHLORIDE 0.9 % IV SOLN
600.0000 mg | Freq: Once | INTRAVENOUS | Status: AC
  Administered 2022-08-20: 600 mg via INTRAVENOUS
  Filled 2022-08-20: qty 60

## 2022-08-20 MED ORDER — PALONOSETRON HCL INJECTION 0.25 MG/5ML
0.2500 mg | Freq: Once | INTRAVENOUS | Status: AC
  Administered 2022-08-20: 0.25 mg via INTRAVENOUS
  Filled 2022-08-20: qty 5

## 2022-08-20 MED ORDER — SODIUM CHLORIDE 0.9 % IV SOLN
10.0000 mg | Freq: Once | INTRAVENOUS | Status: AC
  Administered 2022-08-20: 10 mg via INTRAVENOUS
  Filled 2022-08-20: qty 10

## 2022-08-20 MED ORDER — HEPARIN SOD (PORK) LOCK FLUSH 100 UNIT/ML IV SOLN: 500.0000 [IU] | Freq: Once | INTRAVENOUS | Status: DC | PRN

## 2022-08-20 MED ORDER — TRILACICLIB DIHYDROCHLORIDE INJECTION 300 MG
240.0000 mg/m2 | Freq: Once | INTRAVENOUS | Status: AC
  Administered 2022-08-20: 600 mg via INTRAVENOUS
  Filled 2022-08-20: qty 40

## 2022-08-20 MED ORDER — FAMOTIDINE IN NACL 20-0.9 MG/50ML-% IV SOLN
20.0000 mg | Freq: Once | INTRAVENOUS | Status: AC
  Administered 2022-08-20: 20 mg via INTRAVENOUS
  Filled 2022-08-20: qty 50

## 2022-08-20 MED ORDER — SODIUM CHLORIDE 0.9 % IV SOLN
80.0000 mg/m2 | Freq: Once | INTRAVENOUS | Status: AC
  Administered 2022-08-20: 210 mg via INTRAVENOUS
  Filled 2022-08-20: qty 10.5

## 2022-08-20 MED ORDER — SODIUM CHLORIDE 0.9% FLUSH
10.0000 mL | INTRAVENOUS | Status: DC | PRN
  Administered 2022-08-20: 10 mL

## 2022-08-20 MED ORDER — SODIUM CHLORIDE 0.9% FLUSH
10.0000 mL | Freq: Once | INTRAVENOUS | Status: AC
  Administered 2022-08-20: 10 mL

## 2022-08-20 NOTE — Patient Instructions (Signed)
[facility]  Discharge Instructions: Thank you for choosing [facility] to provide your oncology and hematology care.   If you have a lab appointment with the [facility], please go directly to the [facility] and check in at the registration area.   Wear comfortable clothing and clothing appropriate for easy access to any Portacath or PICC line.   We strive to give you quality time with your provider. You may need to reschedule your appointment if you arrive late (15 or more minutes).  Arriving late affects you and other patients whose appointments are after yours.  Also, if you miss three or more appointments without notifying the office, you may be dismissed from the clinic at the provider's discretion.      For prescription refill requests, have your pharmacy contact our office and allow 72 hours for refills to be completed.    Today you received the following chemotherapy and/or immunotherapy agents : Cosela, Etoposide, Carbo      To help prevent nausea and vomiting after your treatment, we encourage you to take your nausea medication as directed.  BELOW ARE SYMPTOMS THAT SHOULD BE REPORTED IMMEDIATELY: *FEVER GREATER THAN 100.4 F (38 C) OR HIGHER *CHILLS OR SWEATING *NAUSEA AND VOMITING THAT IS NOT CONTROLLED WITH YOUR NAUSEA MEDICATION *UNUSUAL SHORTNESS OF BREATH *UNUSUAL BRUISING OR BLEEDING *URINARY PROBLEMS (pain or burning when urinating, or frequent urination) *BOWEL PROBLEMS (unusual diarrhea, constipation, pain near the anus) TENDERNESS IN MOUTH AND THROAT WITH OR WITHOUT PRESENCE OF ULCERS (sore throat, sores in mouth, or a toothache) UNUSUAL RASH, SWELLING OR PAIN  UNUSUAL VAGINAL DISCHARGE OR ITCHING   Items with * indicate a potential emergency and should be followed up as soon as possible or go to the Emergency Department if any problems should occur.  Please show the CHEMOTHERAPY ALERT CARD or IMMUNOTHERAPY ALERT CARD  at check-in to the Emergency Department and triage nurse.  Should you have questions after your visit or need to cancel or reschedule your appointment, please contact [facility]  Dept: [phone]  and follow the prompts.  Office hours are 8:00 a.m. to 4:30 p.m. Monday - Friday. Please note that voicemails left after 4:00 p.m. may not be returned until the following business day.  We are closed weekends and major holidays. You have access to a nurse at all times for urgent questions. Please call the main number to the clinic Dept: [phone] and follow the prompts.   For any non-urgent questions, you may also contact your provider using MyChart. We now offer e-Visits for anyone 27 and older to request care online for non-urgent symptoms. For details visit [email or web address].   Also download the MyChart app! Go to the app store, search "MyChart", open the app, select [facility], and log in with your MyChart username and password.  Masks are optional in the cancer centers. If you would like for your care team to wear a mask while they are taking care of you, please let them know. For doctor visits, patients may have with them one support person who is at least 71 years old. At this time, visitors are not allowed in the infusion area.

## 2022-08-20 NOTE — Progress Notes (Signed)
Per Dr. Julien Nordmann ,it is ok to treat pt today with Carboplatin and etoposide and ANC of 1.2.

## 2022-08-20 NOTE — Progress Notes (Signed)
[facility] [phone]   [phone]  OFFICE PROGRESS NOTE  [name], NP [place] [address]  DIAGNOSIS: Relapsed extensive stage (T4, N3, M1c)  small cell lung cancer diagnosed in April 2021 and presented with large right upper lobe/right hilar mass with ipsilateral and contralateral mediastinal and right supraclavicular lymphadenopathy in addition to multiple liver lesions. He has disease progression after the first line of chemotherapy in December 2021.  PRIOR THERAPY: 1) Palliative radiotherapy to the right upper lobe lung mass under the care of Dr. Lisbeth Renshaw. 2) Systemic chemotherapy with carboplatin for AUC of 5 on day 1, etoposide 100 mg/M2 on days 1, 2 and 3 in addition to Imfinzi 1500 mg IV every 3 weeks with chemotherapy then every 4 weeks for maintenance if the patient has no evidence for progression.  He will also receive Cosela 240 mg/m2 on the days of the chemotherapy.  Status post 9 cycles.  Starting from cycle #5 the patient will be on maintenance treatment with immunotherapy with Imfinzi 1500 mg IV every 4 weeks. Last dose of chemotherapy was given on November 13, 2020. This treatment was discontinued secondary to disease progression 3) Systemic chemotherapy with carboplatin for AUC of 5 on day 1, etoposide 100 mg/M2 on days 1, 2 and 3, Tecentriq 1200 mg IV every 3 weeks as well as Cosela 250 mg/M2 on the days of the chemotherapy every 3 weeks.  First dose December 18, 2020.  Status post 8 cycles. 4) Zepzelca (lurbinectedin) 3.2 mgm2 IV every 3 weeks. Last dose on 01/23/22. Status post 12 cycles.  5) Palliative systemic chemotherapy with irinotecan 65 mg/m2 on days 1 and 8 IV every 3 weeks.  Status post 3 cycles.  Last dose was given April 01, 2022 discontinued secondary to disease progression   CURRENT THERAPY: systemic chemotherapy with carboplatin for AUC of 5 on day 1, etoposide 100 Mg/M2 on days 1, 2 and 3 with  Cosela before the chemotherapy.  First dose expected to start on 05/28/2022.  Status post 4 cycles.  Starting from cycle #5 his carboplatin will be reduced to AUC of 4 and 2 etoposide 80 Mg/M2.  INTERVAL HISTORY: Adam Wise 71 y.o. male returns to the clinic today for follow-up visit accompanied by his wife.  The patient is feeling fine today with no concerning complaints except for the shortness of breath with exertion as well as cough.  He denied having any chest pain or hemoptysis.  He has no nausea, vomiting, diarrhea or constipation.  He has no headache or visual changes.  He has no recent weight loss or night sweats.  He has been tolerating his treatment with systemic chemotherapy fairly well.  He had repeat CT scan of the chest, abdomen and pelvis performed recently and he is here for evaluation and discussion of his scan results and treatment options.  MEDICAL HISTORY: Past Medical History:  Diagnosis Date   Chickenpox    Chronic knee pain    Chronic low back pain    COPD exacerbation ([facility]) 10/11/2021   COPD with exacerbation ([facility]) 10/12/2021   Coronary artery calcification seen on CAT scan 11/2021   Coronary CTA 11/27/2021: Moderate to large right pleural effusion and compressive atelectasis right lung base. => Coronary Calcium Score 657.  Diffuse RCA calcification.  Minimal mild disease in the LAD and diagonal branches. == Overall limited study.  Notable artifact.   Essential hypertension    GERD (gastroesophageal  reflux disease)    Hyperlipidemia    Malignant neoplasm of upper lobe of right lung ([facility]) 04/04/2020   OSA (obstructive sleep apnea)    With nighttime oxygen supplementation   T4, M3, M1 C Metastatic Small Cell Lung Cancer 03/2020   large right upper lobe/right hilar mass with ipsilateral and contralateral mediastinal and right supraclavicular lymphadenopathy in addition to multiple liver lesios. He has disease progression after the first line of chemotherapy in December  2021.   Testosterone deficiency    Type 2 diabetes mellitus (HCC)     ALLERGIES:  is allergic to bupropion and hydrochlorothiazide.  MEDICATIONS:  Current Outpatient Medications  Medication Sig Dispense Refill   albuterol (VENTOLIN HFA) 108 (90 Base) MCG/ACT inhaler Inhale 2 puffs into the lungs every 6 (six) hours as needed for wheezing or shortness of breath. 18 g 0   amLODipine (NORVASC) 10 MG tablet TAKE 1 TABLET BY MOUTH EVERY DAY FOR BLOOD PRESSURE 90 tablet 3   B-D 3CC LUER-LOK SYR 22GX1" 22G X 1" 3 ML MISC USE AS INSTRUCTED FOR TESTOSTERONE INJECTION EVERY 2 WEEKS 10 each 2   bisoprolol (ZEBETA) 5 MG tablet TAKE 1 TABLET (5 MG TOTAL) BY MOUTH DAILY. 90 tablet 2   Black Pepper-Turmeric (TURMERIC COMPLEX/BLACK PEPPER PO) Take 1 tablet by mouth in the morning and at bedtime.     blood glucose meter kit and supplies KIT Dispense based on patient and insurance preference. Use up to four times daily as directed. 1 each 0   Budeson-Glycopyrrol-Formoterol (BREZTRI AEROSPHERE) 160-9-4.8 MCG/ACT AERO Inhale 2 puffs into the lungs in the morning and at bedtime. 10.7 g 3   chlorpheniramine-HYDROcodone (TUSSIONEX) 10-8 MG/5ML SUER Take 5 mLs by mouth at bedtime as needed for cough. 473 mL 0   ELIQUIS 5 MG TABS tablet TAKE 1 TABLET BY MOUTH TWICE A DAY 180 tablet 2   esomeprazole (NEXIUM) 20 MG capsule Take 20 mg by mouth in the morning.     gabapentin (NEURONTIN) 300 MG capsule Take 1 capsule (300 mg total) by mouth 2 (two) times daily. For back pain. 180 capsule 1   glipiZIDE (GLUCOTROL) 10 MG tablet TAKE 1 TABLET (10 MG TOTAL) BY MOUTH 2 (TWO) TIMES DAILY BEFORE A MEAL. FOR DIABETES. 180 tablet 3   glucose blood (ONETOUCH ULTRA) test strip USE UP TO 4 TIMES DAILY AS DIRECTED 400 strip 5   Insulin Glargine (BASAGLAR KWIKPEN) 100 UNIT/ML Inject 24 Units into the skin at bedtime. For diabetes. 30 mL 1   Insulin Pen Needle (BD PEN NEEDLE NANO U/F) 32G X 4 MM MISC Use with insulin as prescribed Dx  Code: E11.9 200 each 3   ipratropium-albuterol (DUONEB) 0.5-2.5 (3) MG/3ML SOLN Take 3 mLs by nebulization every 4 (four) hours while awake for 3 days, THEN 3 mLs every 4 (four) hours as needed (shortness of breath or wheezing). 360 mL 0   Lancets MISC USE UP TO 3 TIMES DAILY AS DIRECTED 100 each 2   losartan (COZAAR) 50 MG tablet Take 1 tablet (50 mg total) by mouth daily. For blood pressure. 90 tablet 3   Melatonin 10 MG CAPS Take 10 mg by mouth at bedtime.     metFORMIN (GLUCOPHAGE) 1000 MG tablet TAKE 1 TABLET (1,000 MG TOTAL) BY MOUTH 2 (TWO) TIMES DAILY WITH A MEAL. FOR DIABETES 180 tablet 1   Multiple Vitamin (MULTI-VITAMINS) TABS Take 1 tablet by mouth in the morning.     Omega-3 Fatty Acids (SALMON OIL PO) Take  1 capsule by mouth daily.     oxymetazoline (AFRIN) 0.05 % nasal spray Place 1 spray into both nostrils daily.     pioglitazone (ACTOS) 45 MG tablet TAKE 1 TABLET (45 MG TOTAL) BY MOUTH DAILY. FOR DIABETES. 90 tablet 1   pravastatin (PRAVACHOL) 40 MG tablet TAKE 1 TABLET BY MOUTH EVERY DAY IN THE EVENING FOR CHOLESTEROL 90 tablet 1   testosterone cypionate (DEPOTESTOSTERONE CYPIONATE) 200 MG/ML injection Inject 200 mg into the muscle every 14 (fourteen) days.     No current facility-administered medications for this visit.    SURGICAL HISTORY:  Past Surgical History:  Procedure Laterality Date   CHEST TUBE INSERTION Right 01/20/2022   Procedure: CHEST TUBE INSERTION;  Surgeon: Candee Furbish, MD;  Location: [facility] ENDOSCOPY;  Service: Pulmonary;  Laterality: Right;  w/ Talc Pleurodesis, planned admit for Obs afterwards   CHEST TUBE INSERTION Right 04/28/2022   Procedure: INSERTION PLEURAL DRAINAGE CATHETER - Pigtail tail, drainage;  Surgeon: Candee Furbish, MD;  Location: [facility] ENDOSCOPY;  Service: Pulmonary;  Laterality: Right;  talc pleurodesis   COLONOSCOPY WITH PROPOFOL N/A 12/17/2018   Procedure: COLONOSCOPY WITH PROPOFOL;  Surgeon: Virgel Manifold, MD;  Location: [facility]  ENDOSCOPY;  Service: Gastroenterology;  Laterality: N/A;   IR IMAGING GUIDED PORT INSERTION  04/17/2020   IR THORACENTESIS ASP PLEURAL SPACE W/IMG GUIDE  11/29/2021   IR THORACENTESIS ASP PLEURAL SPACE W/IMG GUIDE  01/03/2022   JOINT REPLACEMENT Bilateral    REPLACEMENT TOTAL KNEE BILATERAL  2015   TONSILLECTOMY  1960   TRANSTHORACIC ECHOCARDIOGRAM  05/2020   a) 05/2020: EF 55 to 60%.  No R WMA.  Mild LVH.  Indeterminate LVEDP.  Unable to assess RVP.  Normal aortic and mitral valves.  Mildly elevated RAP.; b) 09/2021: EF 50-55%. No RWMA. Mild LVH. ~ LVEDP. Mild LA Dilation. NORMAL RV/RAP.  Normal MV/AoV.    REVIEW OF SYSTEMS:  Constitutional: positive for fatigue Eyes: negative Ears, nose, mouth, throat, and face: negative Respiratory: positive for cough and dyspnea on exertion Cardiovascular: negative Gastrointestinal: negative Genitourinary:negative Integument/breast: negative Hematologic/lymphatic: negative Musculoskeletal:negative Neurological: negative Behavioral/Psych: negative Endocrine: negative Allergic/Immunologic: negative   PHYSICAL EXAMINATION: General appearance: alert, cooperative, fatigued, and no distress Head: Normocephalic, without obvious abnormality, atraumatic Neck: no adenopathy, no JVD, supple, symmetrical, trachea midline, and thyroid not enlarged, symmetric, no tenderness/mass/nodules Lymph nodes: Cervical, supraclavicular, and axillary nodes normal. Resp: diminished breath sounds RLL and dullness to percussion RLL Back: symmetric, no curvature. ROM normal. No CVA tenderness. Cardio: regular rate and rhythm, S1, S2 normal, no murmur, click, rub or gallop GI: soft, non-tender; bowel sounds normal; no masses,  no organomegaly Extremities: extremities normal, atraumatic, no cyanosis or edema Neurologic: Alert and oriented X 3, normal strength and tone. Normal symmetric reflexes. Normal coordination and gait  ECOG PERFORMANCE STATUS: 1 - Symptomatic but  completely ambulatory  Blood pressure 139/73, pulse 100, temperature 98 F (36.7 C), temperature source Oral, resp. rate 16, weight 298 lb 8 oz (135.4 kg), SpO2 94 %.   LABORATORY DATA: Lab Results  Component Value Date   WBC 2.3 (L) 08/20/2022   HGB 8.9 (L) 08/20/2022   HCT 27.7 (L) 08/20/2022   MCV 94.2 08/20/2022   PLT 157 08/20/2022      Chemistry      Component Value Date/Time   NA 141 08/13/2022 1313   K 4.9 08/13/2022 1313   CL 105 08/13/2022 1313   CO2 28 08/13/2022 1313   BUN 17 08/13/2022 1313  CREATININE 1.07 08/13/2022 1313   CREATININE 1.45 (H) 10/18/2021 1533      Component Value Date/Time   CALCIUM 8.9 08/13/2022 1313   ALKPHOS 63 08/13/2022 1313   AST 20 08/13/2022 1313   ALT 18 08/13/2022 1313   BILITOT 0.4 08/13/2022 1313       RADIOGRAPHIC STUDIES:   ASSESSMENT AND PLAN: This is a very pleasant 71 year old Caucasian male diagnosed with extensive stage (T4, N3, M1C) small cell lung cancer presented with large right upper lobe lung mass in addition to mediastinal and right supraclavicular lymphadenopathy and multiple metastatic liver lesions diagnosed in April 2021. The patient initially underwent systemic chemotherapy with carboplatin for an AUC of 5 on day 1, etoposide 100 mg per metered squared on days 1, 2, and 3 in addition to [facility] for myeloprotection.  He also received immunotherapy with Imfinzi on day 1 of every chemotherapy cycle.  He is status post 9 cycles.  Starting from cycle #5 he was on single agent immunotherapy with Imfinzi IV every 4 weeks.  The patient had evidence of disease progression on his scan from December 2021.  He was then started on systemic chemotherapy with carboplatin for an AUC of 5 on day 1, etoposide 100 mg per metered squared on days 1, 2, and 3 in addition to Tecentriq 1200 mg on day 1, the patient is status post 9 cycles.  Starting from cycle #5, the patient started maintenance immunotherapy with Tecentriq.  This was  discontinued due to evidence of disease progression. The patient started third line treatment with Zepzelca (lurbinectedin) 3.2 Mg/M2 every 3-week status post 12 cycles of this treatment and he has been tolerating it fairly well except for fatigue.  This treatment was discontinued secondary to disease progression. He underwent fourth line treatment with systemic chemotherapy with irinotecan 65 Mg/M2 on days 1 and 8 every 3 weeks.  Status post 3 cycles.  He tolerated this treatment well with no concerning adverse effect except for mild fatigue. Unfortunately his scan showed evidence for disease progression especially in the liver. I recommended for the patient to discontinue his current treatment with irinotecan at this point. The patient was referred to Dr. Lennart Pall at [facility] for consideration of enrollment in the clinical trial with the [facility].  He was not eligible for this trial.  He was also seen at [facility] for a second opinion and he did not like the clinical trial option offered to him. He started systemic chemotherapy again with carboplatin for AUC of 5 on day 1, etoposide 100 Mg/M2 on days 1, 2 and 3 with Cosela status post 4 cycles.  He has been tolerating this treatment well with no concerning adverse effects. He had repeat CT scan of the chest, abdomen and pelvis performed recently.  Fortunately the scan showed further improvement of his disease with continued decrease in the size of the multifocal liver metastasis and stable appearance in the chest. I recommended for the patient to continue his current treatment with carboplatin and Doutova site but I will reduce the dose of carboplatin to AUC of 4 and etoposide 80 Mg/M2 starting from cycle #5.  I will give the patient a total of 6 cycle of this treatment. I will see him back for follow-up visit in 3 weeks for evaluation before starting cycle #6. The patient understands that there  is high risk for hypersensitivity reaction to carboplatin with the more treatment of this regimen.  He understands the risk and willing to continue with the same regimen. For the recurrent right pleural effusion, we will consider The patient for repeat thoracentesis by Dr. Valeta Harms as needed. He was advised to call immediately if he has any other concerning symptoms in the interval.  The patient voices understanding of current disease status and treatment options and is in agreement with the current care plan.  All questions were answered. The patient knows to call the clinic with any problems, questions or concerns. We can certainly see the patient much sooner if necessary.  The total time spent in the appointment was 35 minutes.  Disclaimer: This note was dictated with voice recognition software. Similar sounding words can inadvertently be transcribed and may not be corrected upon review. Eilleen Kempf, MD 08/20/22

## 2022-08-21 ENCOUNTER — Inpatient Hospital Stay: Payer: Medicare Other

## 2022-08-21 VITALS — BP 131/83 | HR 99 | Resp 19

## 2022-08-21 DIAGNOSIS — C3411 Malignant neoplasm of upper lobe, right bronchus or lung: Secondary | ICD-10-CM

## 2022-08-21 DIAGNOSIS — C787 Secondary malignant neoplasm of liver and intrahepatic bile duct: Secondary | ICD-10-CM | POA: Diagnosis not present

## 2022-08-21 DIAGNOSIS — K5900 Constipation, unspecified: Secondary | ICD-10-CM | POA: Diagnosis not present

## 2022-08-21 DIAGNOSIS — J90 Pleural effusion, not elsewhere classified: Secondary | ICD-10-CM | POA: Diagnosis not present

## 2022-08-21 DIAGNOSIS — Z5111 Encounter for antineoplastic chemotherapy: Secondary | ICD-10-CM | POA: Diagnosis not present

## 2022-08-21 DIAGNOSIS — D649 Anemia, unspecified: Secondary | ICD-10-CM | POA: Diagnosis not present

## 2022-08-21 MED ORDER — SODIUM CHLORIDE 0.9 % IV SOLN
10.0000 mg | Freq: Once | INTRAVENOUS | Status: AC
  Administered 2022-08-21: 10 mg via INTRAVENOUS
  Filled 2022-08-21: qty 10

## 2022-08-21 MED ORDER — HEPARIN SOD (PORK) LOCK FLUSH 100 UNIT/ML IV SOLN
500.0000 [IU] | Freq: Once | INTRAVENOUS | Status: AC | PRN
  Administered 2022-08-21: 500 [IU]

## 2022-08-21 MED ORDER — SODIUM CHLORIDE 0.9 % IV SOLN: Freq: Once | INTRAVENOUS | Status: AC

## 2022-08-21 MED ORDER — SODIUM CHLORIDE 0.9 % IV SOLN
80.0000 mg/m2 | Freq: Once | INTRAVENOUS | Status: AC
  Administered 2022-08-21: 210 mg via INTRAVENOUS
  Filled 2022-08-21: qty 10.5

## 2022-08-21 MED ORDER — TRILACICLIB DIHYDROCHLORIDE INJECTION 300 MG
240.0000 mg/m2 | Freq: Once | INTRAVENOUS | Status: AC
  Administered 2022-08-21: 600 mg via INTRAVENOUS
  Filled 2022-08-21: qty 40

## 2022-08-21 MED ORDER — SODIUM CHLORIDE 0.9% FLUSH
10.0000 mL | INTRAVENOUS | Status: DC | PRN
  Administered 2022-08-21: 10 mL

## 2022-08-22 ENCOUNTER — Ambulatory Visit: Payer: Medicare Other

## 2022-08-22 ENCOUNTER — Inpatient Hospital Stay: Payer: Medicare Other

## 2022-08-22 VITALS — BP 133/73 | HR 90 | Temp 97.9°F | Resp 18

## 2022-08-22 DIAGNOSIS — K5900 Constipation, unspecified: Secondary | ICD-10-CM | POA: Diagnosis not present

## 2022-08-22 DIAGNOSIS — C3411 Malignant neoplasm of upper lobe, right bronchus or lung: Secondary | ICD-10-CM | POA: Diagnosis not present

## 2022-08-22 DIAGNOSIS — Z5111 Encounter for antineoplastic chemotherapy: Secondary | ICD-10-CM | POA: Diagnosis not present

## 2022-08-22 DIAGNOSIS — D649 Anemia, unspecified: Secondary | ICD-10-CM | POA: Diagnosis not present

## 2022-08-22 DIAGNOSIS — C787 Secondary malignant neoplasm of liver and intrahepatic bile duct: Secondary | ICD-10-CM | POA: Diagnosis not present

## 2022-08-22 DIAGNOSIS — J90 Pleural effusion, not elsewhere classified: Secondary | ICD-10-CM | POA: Diagnosis not present

## 2022-08-22 MED ORDER — HEPARIN SOD (PORK) LOCK FLUSH 100 UNIT/ML IV SOLN
500.0000 [IU] | Freq: Once | INTRAVENOUS | Status: AC | PRN
  Administered 2022-08-22: 500 [IU]

## 2022-08-22 MED ORDER — SODIUM CHLORIDE 0.9 % IV SOLN: Freq: Once | INTRAVENOUS | Status: AC

## 2022-08-22 MED ORDER — SODIUM CHLORIDE 0.9 % IV SOLN
10.0000 mg | Freq: Once | INTRAVENOUS | Status: AC
  Administered 2022-08-22: 10 mg via INTRAVENOUS
  Filled 2022-08-22: qty 10

## 2022-08-22 MED ORDER — SODIUM CHLORIDE 0.9 % IV SOLN
80.0000 mg/m2 | Freq: Once | INTRAVENOUS | Status: AC
  Administered 2022-08-22: 210 mg via INTRAVENOUS
  Filled 2022-08-22: qty 10.5

## 2022-08-22 MED ORDER — TRILACICLIB DIHYDROCHLORIDE INJECTION 300 MG
240.0000 mg/m2 | Freq: Once | INTRAVENOUS | Status: AC
  Administered 2022-08-22: 600 mg via INTRAVENOUS
  Filled 2022-08-22: qty 40

## 2022-08-22 MED ORDER — SODIUM CHLORIDE 0.9% FLUSH
10.0000 mL | INTRAVENOUS | Status: DC | PRN
  Administered 2022-08-22: 10 mL

## 2022-08-22 NOTE — Patient Instructions (Signed)
[facility]  Discharge Instructions: Thank you for choosing [facility] to provide your oncology and hematology care.   If you have a lab appointment with the [facility], please go directly to the [facility] and check in at the registration area.   Wear comfortable clothing and clothing appropriate for easy access to any Portacath or PICC line.   We strive to give you quality time with your provider. You may need to reschedule your appointment if you arrive late (15 or more minutes).  Arriving late affects you and other patients whose appointments are after yours.  Also, if you miss three or more appointments without notifying the office, you may be dismissed from the clinic at the provider's discretion.      For prescription refill requests, have your pharmacy contact our office and allow 72 hours for refills to be completed.    Today you received the following chemotherapy and/or immunotherapy agents : Cosela & Etoposide.   To help prevent nausea and vomiting after your treatment, we encourage you to take your nausea medication as directed.  BELOW ARE SYMPTOMS THAT SHOULD BE REPORTED IMMEDIATELY: *FEVER GREATER THAN 100.4 F (38 C) OR HIGHER *CHILLS OR SWEATING *NAUSEA AND VOMITING THAT IS NOT CONTROLLED WITH YOUR NAUSEA MEDICATION *UNUSUAL SHORTNESS OF BREATH *UNUSUAL BRUISING OR BLEEDING *URINARY PROBLEMS (pain or burning when urinating, or frequent urination) *BOWEL PROBLEMS (unusual diarrhea, constipation, pain near the anus) TENDERNESS IN MOUTH AND THROAT WITH OR WITHOUT PRESENCE OF ULCERS (sore throat, sores in mouth, or a toothache) UNUSUAL RASH, SWELLING OR PAIN  UNUSUAL VAGINAL DISCHARGE OR ITCHING   Items with * indicate a potential emergency and should be followed up as soon as possible or go to the Emergency Department if any problems should occur.  Please show the CHEMOTHERAPY ALERT CARD or IMMUNOTHERAPY ALERT CARD at  check-in to the Emergency Department and triage nurse.  Should you have questions after your visit or need to cancel or reschedule your appointment, please contact [facility]  Dept: [phone]  and follow the prompts.  Office hours are 8:00 a.m. to 4:30 p.m. Monday - Friday. Please note that voicemails left after 4:00 p.m. may not be returned until the following business day.  We are closed weekends and major holidays. You have access to a nurse at all times for urgent questions. Please call the main number to the clinic Dept: [phone] and follow the prompts.   For any non-urgent questions, you may also contact your provider using MyChart. We now offer e-Visits for anyone 13 and older to request care online for non-urgent symptoms. For details visit [email or web address].   Also download the MyChart app! Go to the app store, search "MyChart", open the app, select [facility], and log in with your MyChart username and password.  Masks are optional in the cancer centers. If you would like for your care team to wear a mask while they are taking care of you, please let them know. For doctor visits, patients may have with them one support person who is at least 72 years old. At this time, visitors are not allowed in the infusion area.

## 2022-08-25 ENCOUNTER — Encounter: Payer: Self-pay | Admitting: Pulmonary Disease

## 2022-08-27 ENCOUNTER — Inpatient Hospital Stay: Payer: Medicare Other

## 2022-08-27 ENCOUNTER — Other Ambulatory Visit: Payer: Self-pay

## 2022-08-27 ENCOUNTER — Telehealth: Payer: Self-pay | Admitting: Pulmonary Disease

## 2022-08-27 DIAGNOSIS — C3411 Malignant neoplasm of upper lobe, right bronchus or lung: Secondary | ICD-10-CM | POA: Diagnosis not present

## 2022-08-27 DIAGNOSIS — J90 Pleural effusion, not elsewhere classified: Secondary | ICD-10-CM

## 2022-08-27 DIAGNOSIS — D649 Anemia, unspecified: Secondary | ICD-10-CM | POA: Diagnosis not present

## 2022-08-27 DIAGNOSIS — C787 Secondary malignant neoplasm of liver and intrahepatic bile duct: Secondary | ICD-10-CM | POA: Diagnosis not present

## 2022-08-27 DIAGNOSIS — Z5111 Encounter for antineoplastic chemotherapy: Secondary | ICD-10-CM | POA: Diagnosis not present

## 2022-08-27 DIAGNOSIS — K5900 Constipation, unspecified: Secondary | ICD-10-CM | POA: Diagnosis not present

## 2022-08-27 HISTORY — DX: Pleural effusion, not elsewhere classified: J90

## 2022-08-27 LAB — CBC WITH DIFFERENTIAL (CANCER CENTER ONLY)
Abs Immature Granulocytes: 0.04 10*3/uL (ref 0.00–0.07)
Basophils Absolute: 0 10*3/uL (ref 0.0–0.1)
Basophils Relative: 1 %
Eosinophils Absolute: 0.1 10*3/uL (ref 0.0–0.5)
Eosinophils Relative: 2 %
HCT: 27.8 % — ABNORMAL LOW (ref 39.0–52.0)
Hemoglobin: 9 g/dL — ABNORMAL LOW (ref 13.0–17.0)
Immature Granulocytes: 1 %
Lymphocytes Relative: 11 %
Lymphs Abs: 0.4 10*3/uL — ABNORMAL LOW (ref 0.7–4.0)
MCH: 30.1 pg (ref 26.0–34.0)
MCHC: 32.4 g/dL (ref 30.0–36.0)
MCV: 93 fL (ref 80.0–100.0)
Monocytes Absolute: 0.2 10*3/uL (ref 0.1–1.0)
Monocytes Relative: 5 %
Neutro Abs: 2.8 10*3/uL (ref 1.7–7.7)
Neutrophils Relative %: 80 %
Platelet Count: 146 10*3/uL — ABNORMAL LOW (ref 150–400)
RBC: 2.99 MIL/uL — ABNORMAL LOW (ref 4.22–5.81)
RDW: 18 % — ABNORMAL HIGH (ref 11.5–15.5)
WBC Count: 3.5 10*3/uL — ABNORMAL LOW (ref 4.0–10.5)
nRBC: 0 % (ref 0.0–0.2)

## 2022-08-27 LAB — CMP (CANCER CENTER ONLY)
ALT: 18 U/L (ref 0–44)
AST: 20 U/L (ref 15–41)
Albumin: 3.8 g/dL (ref 3.5–5.0)
Alkaline Phosphatase: 57 U/L (ref 38–126)
Anion gap: 8 (ref 5–15)
BUN: 22 mg/dL (ref 8–23)
CO2: 27 mmol/L (ref 22–32)
Calcium: 8.9 mg/dL (ref 8.9–10.3)
Chloride: 105 mmol/L (ref 98–111)
Creatinine: 1.1 mg/dL (ref 0.61–1.24)
GFR, Estimated: >60 mL/min (ref >60)
Glucose, Bld: 154 mg/dL — ABNORMAL HIGH (ref 70–99)
Potassium: 4.7 mmol/L (ref 3.5–5.1)
Sodium: 140 mmol/L (ref 135–145)
Total Bilirubin: 0.5 mg/dL (ref 0.3–1.2)
Total Protein: 6.6 g/dL (ref 6.5–8.1)

## 2022-08-27 NOTE — Telephone Encounter (Signed)
Please advise 

## 2022-08-27 NOTE — Telephone Encounter (Signed)
Dr Valeta Harms - let me know what you want me to set up for him.

## 2022-08-27 NOTE — Telephone Encounter (Signed)
PCCM:  Patient is on anticoagulation with Eliquis. Please stop anticoagulation on Friday.  I called and spoke with the patient regarding this.  We are actually going to try to do his case for pigtail catheter placement and repeat talc pleurodesis on Monday afternoon with Dr. Tamala Julian.  Patient is agreeable to this plan. Case request orders have been placed.  Garner Nash, DO [facility] 08/27/2022 3:31 PM

## 2022-08-27 NOTE — Telephone Encounter (Signed)
I have called [facility] and Brett asked me to call back in the morning.

## 2022-08-27 NOTE — Telephone Encounter (Signed)
Dr.  Valeta Harms, please see mychart message sent by pt and advise.

## 2022-08-28 NOTE — Telephone Encounter (Signed)
Pt has been scheduled for 9/18 at 2:30.  Instructions in message state to stop Eliquis on Friday.  I gave appt info to pt's wife and she wants to verify the medication info.  She states he takes Eliquis twice a day - morning and afternoon.  She wants to know if he should take the morning dose and just not take the afternoon dose.  She asked for a response to be sent to the pt in [place].  Will route to triage to make pt aware thru MyChart and to close the message.

## 2022-08-28 NOTE — Telephone Encounter (Signed)
Pt has been scheduled for 9/18 at 2:30.  Gave appt info to pt's wife and she had a question about the Eliquis.  She asked for a response to be sent to pt thru [facility].  I have routed the MyChart message to triage.  Nothing further needed in this message.

## 2022-09-01 ENCOUNTER — Encounter (HOSPITAL_COMMUNITY): Payer: Self-pay | Admitting: Internal Medicine

## 2022-09-01 ENCOUNTER — Observation Stay (HOSPITAL_COMMUNITY)
Admission: RE | Admit: 2022-09-01 | Discharge: 2022-09-02 | Disposition: A | Discharge status: Home / Self Care | Payer: Medicare Other | Attending: Emergency Medicine | Admitting: Emergency Medicine

## 2022-09-01 ENCOUNTER — Ambulatory Visit (HOSPITAL_COMMUNITY): Payer: Medicare Other

## 2022-09-01 ENCOUNTER — Other Ambulatory Visit: Payer: Self-pay

## 2022-09-01 ENCOUNTER — Encounter (HOSPITAL_COMMUNITY)
Admission: RE | Disposition: A | Discharge status: Home / Self Care | Payer: Self-pay | Source: Home / Self Care | Attending: Pulmonary Disease

## 2022-09-01 DIAGNOSIS — Z87891 Personal history of nicotine dependence: Secondary | ICD-10-CM | POA: Diagnosis not present

## 2022-09-01 DIAGNOSIS — E785 Hyperlipidemia, unspecified: Secondary | ICD-10-CM | POA: Insufficient documentation

## 2022-09-01 DIAGNOSIS — K219 Gastro-esophageal reflux disease without esophagitis: Secondary | ICD-10-CM | POA: Insufficient documentation

## 2022-09-01 DIAGNOSIS — Z85118 Personal history of other malignant neoplasm of bronchus and lung: Secondary | ICD-10-CM | POA: Diagnosis not present

## 2022-09-01 DIAGNOSIS — J90 Pleural effusion, not elsewhere classified: Principal | ICD-10-CM | POA: Insufficient documentation

## 2022-09-01 DIAGNOSIS — I10 Essential (primary) hypertension: Secondary | ICD-10-CM | POA: Insufficient documentation

## 2022-09-01 DIAGNOSIS — Z794 Long term (current) use of insulin: Secondary | ICD-10-CM | POA: Insufficient documentation

## 2022-09-01 DIAGNOSIS — I2510 Atherosclerotic heart disease of native coronary artery without angina pectoris: Secondary | ICD-10-CM | POA: Diagnosis not present

## 2022-09-01 DIAGNOSIS — J939 Pneumothorax, unspecified: Principal | ICD-10-CM | POA: Diagnosis present

## 2022-09-01 DIAGNOSIS — J441 Chronic obstructive pulmonary disease with (acute) exacerbation: Secondary | ICD-10-CM | POA: Diagnosis not present

## 2022-09-01 DIAGNOSIS — J9811 Atelectasis: Secondary | ICD-10-CM | POA: Diagnosis not present

## 2022-09-01 DIAGNOSIS — E119 Type 2 diabetes mellitus without complications: Secondary | ICD-10-CM | POA: Insufficient documentation

## 2022-09-01 DIAGNOSIS — Z7984 Long term (current) use of oral hypoglycemic drugs: Secondary | ICD-10-CM | POA: Insufficient documentation

## 2022-09-01 DIAGNOSIS — Z7901 Long term (current) use of anticoagulants: Secondary | ICD-10-CM | POA: Diagnosis not present

## 2022-09-01 DIAGNOSIS — R0600 Dyspnea, unspecified: Secondary | ICD-10-CM | POA: Diagnosis present

## 2022-09-01 DIAGNOSIS — Z4682 Encounter for fitting and adjustment of non-vascular catheter: Secondary | ICD-10-CM | POA: Diagnosis not present

## 2022-09-01 DIAGNOSIS — R52 Pain, unspecified: Secondary | ICD-10-CM

## 2022-09-01 DIAGNOSIS — Z86711 Personal history of pulmonary embolism: Secondary | ICD-10-CM | POA: Diagnosis not present

## 2022-09-01 DIAGNOSIS — Z79899 Other long term (current) drug therapy: Secondary | ICD-10-CM | POA: Diagnosis not present

## 2022-09-01 DIAGNOSIS — R918 Other nonspecific abnormal finding of lung field: Secondary | ICD-10-CM | POA: Diagnosis not present

## 2022-09-01 HISTORY — PX: CHEST TUBE INSERTION: SHX231

## 2022-09-01 HISTORY — PX: TALC PLEURODESIS: SHX2506

## 2022-09-01 HISTORY — DX: Pneumothorax, unspecified: J93.9

## 2022-09-01 SURGERY — INSERTION, PLEURAL DRAINAGE CATHETER
Anesthesia: LOCAL

## 2022-09-01 SURGERY — INSERTION, PLEURAL DRAINAGE CATHETER
Anesthesia: Topical | Laterality: Right

## 2022-09-01 MED ORDER — PANTOPRAZOLE SODIUM 40 MG PO TBEC
40.0000 mg | DELAYED_RELEASE_TABLET | Freq: Every day | ORAL | Status: DC
  Administered 2022-09-01 – 2022-09-02 (×2): 40 mg via ORAL
  Filled 2022-09-01 (×2): qty 1

## 2022-09-01 MED ORDER — INSULIN ASPART 100 UNIT/ML IJ SOLN: 0.0000 [IU] | Freq: Every day | INTRAMUSCULAR | Status: DC

## 2022-09-01 MED ORDER — HYDROCOD POLI-CHLORPHE POLI ER 10-8 MG/5ML PO SUER: 5.0000 mL | Freq: Two times a day (BID) | ORAL | Status: DC | PRN

## 2022-09-01 MED ORDER — INSULIN ASPART 100 UNIT/ML IJ SOLN: 0.0000 [IU] | Freq: Three times a day (TID) | INTRAMUSCULAR | Status: DC

## 2022-09-01 MED ORDER — AMLODIPINE BESYLATE 10 MG PO TABS
10.0000 mg | ORAL_TABLET | Freq: Every day | ORAL | Status: DC
  Administered 2022-09-02: 10 mg via ORAL
  Filled 2022-09-01: qty 1

## 2022-09-01 MED ORDER — TALC (STERITALC) POWDER FOR INTRAPLEURAL USE
4.0000 g | Freq: Once | INTRAVENOUS | Status: AC
  Administered 2022-09-01: 4 g via INTRAPLEURAL
  Filled 2022-09-01: qty 4

## 2022-09-01 MED ORDER — LOSARTAN POTASSIUM 50 MG PO TABS
50.0000 mg | ORAL_TABLET | Freq: Every day | ORAL | Status: DC
  Administered 2022-09-02: 50 mg via ORAL
  Filled 2022-09-01: qty 1

## 2022-09-01 MED ORDER — PRAVASTATIN SODIUM 40 MG PO TABS: 40.0000 mg | ORAL_TABLET | Freq: Every day | ORAL | Status: DC

## 2022-09-01 MED ORDER — INSULIN GLARGINE-YFGN 100 UNIT/ML ~~LOC~~ SOLN
24.0000 [IU] | Freq: Every day | SUBCUTANEOUS | Status: DC
  Filled 2022-09-01 (×2): qty 0.24

## 2022-09-01 MED ORDER — POLYETHYLENE GLYCOL 3350 17 G PO PACK: 17.0000 g | PACK | Freq: Every day | ORAL | Status: DC | PRN

## 2022-09-01 MED ORDER — DOCUSATE SODIUM 100 MG PO CAPS: 100.0000 mg | ORAL_CAPSULE | Freq: Two times a day (BID) | ORAL | Status: DC | PRN

## 2022-09-01 MED ORDER — METFORMIN HCL 500 MG PO TABS
1000.0000 mg | ORAL_TABLET | Freq: Two times a day (BID) | ORAL | Status: DC
  Administered 2022-09-02: 1000 mg via ORAL
  Filled 2022-09-01: qty 2

## 2022-09-01 MED ORDER — HYDROMORPHONE HCL 1 MG/ML IJ SOLN
INTRAMUSCULAR | Status: AC
  Filled 2022-09-01: qty 1

## 2022-09-01 MED ORDER — LIDOCAINE HCL (PF) 1 % IJ SOLN
INTRAMUSCULAR | Status: AC
  Filled 2022-09-01: qty 30

## 2022-09-01 MED ORDER — MOMETASONE FURO-FORMOTEROL FUM 200-5 MCG/ACT IN AERO
2.0000 | INHALATION_SPRAY | Freq: Two times a day (BID) | RESPIRATORY_TRACT | Status: DC
  Filled 2022-09-01: qty 8.8

## 2022-09-01 MED ORDER — ALBUTEROL SULFATE (2.5 MG/3ML) 0.083% IN NEBU: 2.5000 mg | INHALATION_SOLUTION | RESPIRATORY_TRACT | Status: DC | PRN

## 2022-09-01 MED ORDER — BISOPROLOL FUMARATE 5 MG PO TABS
5.0000 mg | ORAL_TABLET | Freq: Every day | ORAL | Status: DC
  Administered 2022-09-02: 5 mg via ORAL
  Filled 2022-09-01: qty 1

## 2022-09-01 MED ORDER — HYDROMORPHONE HCL 1 MG/ML IJ SOLN
0.5000 mg | INTRAMUSCULAR | Status: DC | PRN
  Administered 2022-09-01 (×2): 0.5 mg via INTRAVENOUS

## 2022-09-01 MED ORDER — GLIPIZIDE 10 MG PO TABS
10.0000 mg | ORAL_TABLET | Freq: Two times a day (BID) | ORAL | Status: DC
  Administered 2022-09-02: 10 mg via ORAL
  Filled 2022-09-01 (×3): qty 1

## 2022-09-01 MED ORDER — PIOGLITAZONE HCL 15 MG PO TABS
45.0000 mg | ORAL_TABLET | Freq: Every day | ORAL | Status: DC
  Filled 2022-09-01: qty 1

## 2022-09-01 MED ORDER — LIDOCAINE HCL 1 % IJ SOLN
10.0000 mL | Freq: Once | INTRAMUSCULAR | Status: AC
  Administered 2022-09-01: 10 mL via INTRAPLEURAL
  Filled 2022-09-01: qty 10

## 2022-09-01 NOTE — H&P (Signed)
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 He presents now with dyspnea consistent with prior episodes of recurrent R pleural effusion.  He has had some mild hypoxemia at home.  He feels well otherwise.    Past Medical History:  Diagnosis Date   Chickenpox    Chronic knee pain    Chronic low back pain    COPD exacerbation ([place]) 10/11/2021   COPD with exacerbation ([place]) 10/12/2021   Coronary artery calcification seen on CAT scan 11/2021   Coronary CTA 11/27/2021: Moderate to large right pleural effusion and compressive atelectasis right lung base. => Coronary Calcium Score 657.  Diffuse RCA calcification.  Minimal mild disease in the LAD and diagonal branches. == Overall limited study.  Notable artifact.   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 He has disease progression after the first line of chemotherapy in December 2021.   Testosterone deficiency    Type 2 diabetes mellitus ([place])      Family History  Problem Relation Age of Onset   Heart attack Mother    Cancer Mother    Hypertension Mother    Arthritis Father    Asthma Father    Cancer Father    COPD Father    Heart attack Father    Hypertension Sister    Cancer Sister    Diabetes Sister    Asthma Sister    Cancer Sister    COPD Sister    Arthritis Sister    Asthma Sister    Diabetes Sister    Birth defects Maternal Grandfather    Arthritis Paternal Grandmother     Diabetes Paternal Grandmother    Arthritis Paternal Grandfather    Asthma Son    Other Neg Hx        pituitary disorder     Social History   Socioeconomic History   Marital status: Married    Spouse name: Not on file   Number of children: Not on file   Years of education: Not on file   Highest education level: Not on file  Occupational History   Not on file  Tobacco Use   Smoking status: Former   Smokeless tobacco: Never  Vaping Use   Vaping Use: Never used  Substance and Sexual Activity   Alcohol use: Not Currently    Comment: rarely   Drug use: Not Currently   Sexual activity: Yes  Other Topics Concern   Not on file  Social History Narrative   Married.   Moved from [place].   Retired.   Social Determinants of Health   Financial Resource Strain: Low Risk  (02/14/2022)   Overall Financial Resource Strain (CARDIA)    Difficulty of Paying Living Expenses: Not hard at all  Food Insecurity: No Food Insecurity (02/14/2022)   Hunger Vital Sign    Worried About Running Out of Food in the Last Year: Never true    Ran Out of Food in the Last Year: Never true  Transportation Needs: No Transportation Needs (02/14/2022)   PRAPARE - Hydrologist (Medical): No    Lack of Transportation (Non-Medical): No  Physical Activity: Inactive (02/14/2022)   Exercise Vital Sign    Days of Exercise per Week: 0 days    Minutes of Exercise per Session: 0 min  Stress: No Stress Concern Present (02/14/2022)   [place]    Feeling of Stress : Only a little  Social Connections: Moderately Integrated (02/14/2022)   Social Connection and Isolation Panel [NHANES]    Frequency of Communication with Friends and Family: Twice a week    Frequency of Social Gatherings with Friends and Family: Twice a week    Attends Religious Services: 1 to 4 times per year    Active Member of Clubs or Organizations: No    Attends English as a second language teacher Meetings: Never    Marital Status: Married  Human resources officer Violence: Not At Risk (02/14/2022)   Humiliation, Afraid, Rape, and Kick questionnaire    Fear of Current or Ex-Partner: No    Emotionally Abused: No    Physically Abused: No    Sexually Abused: No     Allergies  Allergen Reactions   Bupropion     Racing heart   Hydrochlorothiazide Other (See Comments)    Cramping to lower extremities     @encmedstart @ Vitals:   09/01/22 1409  BP: 132/83  Pulse: 93  Resp: 20  Temp: 97.8 F (36.6 C)  TempSrc: Temporal  SpO2: 96%  Weight: 135.4 kg  Height: 5' 9.5" (1.765 m)   RA  General:  Resting comfortably in bed HENT: NCAT OP clear PULM: Diminished R base B, normal effort CV: RRR, no mgr GI: BS+, soft, nontender MSK: normal bulk and tone Neuro: awake, alert, no distress, MAEW  CBC    Component Value Date/Time   WBC 3.5 (L) 08/27/2022 1337   WBC 2.5 (L) 10/18/2021 1533   RBC 2.99 (L) 08/27/2022 1337   HGB 9.0 (L) 08/27/2022 1337   HCT 27.8 (L) 08/27/2022 1337   HCT 42.7 10/06/2017 1052   PLT 146 (L) 08/27/2022 1337   MCV 93.0 08/27/2022 1337   MCH 30.1 08/27/2022 1337   MCHC 32.4 08/27/2022 1337   RDW 18.0 (H) 08/27/2022 1337   LYMPHSABS 0.4 (L) 08/27/2022 1337   MONOABS 0.2 08/27/2022 1337   EOSABS 0.1 08/27/2022 1337   BASOSABS 0.0 08/27/2022 1337   BMET    Component Value Date/Time   NA 140 08/27/2022 1337   K 4.7 08/27/2022 1337   CL 105 08/27/2022 1337   CO2 27 08/27/2022 1337   GLUCOSE 154 (H) 08/27/2022 1337   BUN 22 08/27/2022 1337   CREATININE 1.10 08/27/2022 1337   CREATININE 1.45 (H) 10/18/2021 1533   CALCIUM 8.9 08/27/2022 1337   GFRNONAA >60 08/27/2022 1337   Impression: Malignant pleural effusion Small cel lung cancer RUL COPD  Plan: Pigtail catheter placement and talc pleurodesis  Roselie Awkward, MD [place] [facility] Pager: [phone] Cell: [phone] After 7:00 pm call Elink  [phone]

## 2022-09-01 NOTE — Op Note (Signed)
Chest Tube Pleurodesis Note  Tryston Gilliam  [id number]  Jul 17, 1951  Date:09/01/22  Time:3:49 PM   Provider Performing:Brent Alexiya Franqui   Procedure: Chemical Pleurodesis via Chest Tube (32560)  Indication(s) Induced scarring of pleural space  Consent Risks of the procedure as well as the alternatives and risks of each were explained to the patient and/or caregiver.  Consent for the procedure was obtained.   Anesthesia 10cc 1% Lidocaine injected in with pleurodesis agents, Talc Slurry   Time Out Verified patient identification, verified procedure, site/side was marked, verified correct patient position, special equipment/implants available, medications/allergies/relevant history reviewed, required imaging and test results available.   Sterile Technique Hand hygiene, gloves   Procedure Description Existing pleural catheter was cleaned and accessed in sterile manner.  100cc 4gm talc slurry administered into pleural space.  The chest tube will be clamped and patient will rotate positions for 1 hour then chest tube will be placed to suction.   Complications/Tolerance None; patient tolerated the procedure well.   EBL None   Specimen(s) None  Roselie Awkward, MD [place] [facility] Pager: [phone] Cell: [phone] After 7:00 pm call Elink  [phone]

## 2022-09-01 NOTE — Op Note (Signed)
Insertion of Chest Tube Procedure Note  Adam Wise  [id number]  09/24/1951  Date:09/01/22  Time:3:13 PM    Provider Performing: Roselie Awkward   Procedure: Pleural Catheter Insertion w/ Imaging Guidance [phone])  Indication(s) Effusion  Consent Risks of the procedure as well as the alternatives and risks of each were explained to the patient and/or caregiver.  Consent for the procedure was obtained and is signed in the bedside chart  Anesthesia Topical only with 1% lidocaine    Time Out Verified patient identification, verified procedure, site/side was marked, verified correct patient position, special equipment/implants available, medications/allergies/relevant history reviewed, required imaging and test results available.   Sterile Technique Maximal sterile technique including full sterile barrier drape, hand hygiene, sterile gown, sterile gloves, mask, hair covering, sterile ultrasound probe cover (if used).   Procedure Description Ultrasound used to identify appropriate pleural anatomy for placement and overlying skin marked. Area of placement cleaned and draped in sterile fashion.  A 14 French pigtail pleural catheter was placed into the right pleural space using Seldinger technique. Appropriate return of fluid was obtained.  The tube was connected to atrium and placed on -20 cm H2O wall suction.   Complications/Tolerance None; patient tolerated the procedure well. Chest X-ray is ordered to verify placement.   EBL Minimal  Specimen(s) none  Roselie Awkward, MD [place] [facility] Pager: [phone] Cell: [phone] After 7:00 pm call Elink  [phone]

## 2022-09-01 NOTE — H&P (Signed)
NAME:  Adam Wise, MRN:  [id number], DOB:  03/22/1951, LOS: 0 ADMISSION DATE:  09/01/2022, CONSULTATION DATE:  9/18 REFERRING MD:  9/18, CHIEF COMPLAINT:  small post-procedural PTX   History of Present Illness:  71 year old male w/ extensive SCLC w/ recurrent Right malignant pleural effusion. Presented to procedural lab 9/18 for 3rd Talc Pleurodesis. Underwent successful Small bore tube thoracostomy which we drained 1300 ml pleural fluid & then he subsequently underwent Talk Pleurodesis. His CT was removed. A f/u CXR was obtained and showed very small right PTX. Adam Wise was admitted for observation   Pertinent  Medical History  Extensive stage SM cell carcinoma w/ Mets to right pleura and recurrent pleural effusion s/p talc pleurodesis X 2.  Chronic knee and low back pain.  CAD GERD HTN HL Type II DM Significant Hospital Events: Including procedures, antibiotic start and stop dates in addition to other pertinent events   9/18 right talc pleurodesis. 1300 ml removed prior. After CT removal small PTX. Admitted for OBS  Interim History / Subjective:  Denies pain  Objective   Blood pressure 107/78, pulse 89, temperature 97.8 F (36.6 C), temperature source Temporal, resp. rate 12, height 5' 9.5" (1.765 m), weight 135.4 kg, SpO2 94 %.        Intake/Output Summary (Last 24 hours) at 09/01/2022 1817 Last data filed at 09/01/2022 1700 Gross per 24 hour  Intake no documentation  Output 1350 ml  Net -1350 ml   Filed Weights   09/01/22 1409 09/01/22 1452  Weight: 135.4 kg 135.4 kg    Examination: General: 71 yowm resting in bed. He is in no distress HENT: NCAT no JVD MMM Lungs: clear. No accessory use. Right CT dressing CD&I Cardiovascular: RRR Abdomen: soft not tender  Extremities: warm and dry no sig edema  Neuro: awake and oriented  GU: due to void   Resolved Hospital Problem list   NA  Assessment & Plan:  Post-procedural Right PTX. Favor ex-vacuo given the chronicity  of the pleural fluid collection  Plan Admit for Obs PRN analgesia  AM CXR. If stable can go tomorrow  Extensive NSCLC w/ malignant right Pleural effusion, now s/p 3rd round of talc Plan F/u OP setting    H/o PE Plan Holding DOAC given possible need for CT  COPD  Plan Cont BD/ICS  HTN Plan Cont home meds   GERD Plan PPI  DM type II Plan Cont oral meds   HL Plan Cont home meds  Best Practice (right click and "Reselect all SmartList Selections" daily)   Diet/type: Regular consistency (see orders) DVT prophylaxis: SCD GI prophylaxis: PPI Lines: Central line (has port) Foley:  N/A Code Status:  full code Last date of multidisciplinary goals of care discussion [pending]  Labs   CBC: Recent Labs  Lab 08/27/22 1337  WBC 3.5*  NEUTROABS 2.8  HGB 9.0*  HCT 27.8*  MCV 93.0  PLT 146*    Basic Metabolic Panel: Recent Labs  Lab 08/27/22 1337  NA 140  K 4.7  CL 105  CO2 27  GLUCOSE 154*  BUN 22  CREATININE 1.10  CALCIUM 8.9   GFR: Estimated Creatinine Clearance: 83.5 mL/min (by C-G formula based on SCr of 1.1 mg/dL). Recent Labs  Lab 08/27/22 1337  WBC 3.5*    Liver Function Tests: Recent Labs  Lab 08/27/22 1337  AST 20  ALT 18  ALKPHOS 57  BILITOT 0.5  PROT 6.6  ALBUMIN 3.8   No results for input(s): "  LIPASE", "AMYLASE" in the last 168 hours. No results for input(s): "AMMONIA" in the last 168 hours.  ABG No results found for: "PHART", "PCO2ART", "PO2ART", "HCO3", "TCO2", "ACIDBASEDEF", "O2SAT"   Coagulation Profile: No results for input(s): "INR", "PROTIME" in the last 168 hours.  Cardiac Enzymes: No results for input(s): "CKTOTAL", "CKMB", "CKMBINDEX", "TROPONINI" in the last 168 hours.  HbA1C: Hemoglobin A1C  Date/Time Value Ref Range Status  06/24/2022 12:55 PM 8.2 (A) 4.0 - 5.6 % Final  12/18/2021 02:04 PM 7.0 (A) 4.0 - 5.6 % Final   Hgb A1c MFr Bld  Date/Time Value Ref Range Status  08/13/2021 11:17 AM 8.1 (H) 4.6 -  6.5 % Final    Comment:    Glycemic Control Guidelines for People with Diabetes:Non Diabetic:  <6%Goal of Therapy: <7%Additional Action Suggested:  >8%   11/01/2019 08:46 AM 7.6 (H) 4.6 - 6.5 % Final    Comment:    Glycemic Control Guidelines for People with Diabetes:Non Diabetic:  <6%Goal of Therapy: <7%Additional Action Suggested:  >8%     CBG: No results for input(s): "GLUCAP" in the last 168 hours.  Review of Systems:   Review of Systems  Constitutional: Negative.   HENT: Negative.    Eyes: Negative.   Respiratory: Negative.    Cardiovascular: Negative.   Gastrointestinal: Negative.   Genitourinary: Negative.   Musculoskeletal: Negative.   Skin: Negative.   Neurological: Negative.   Endo/Heme/Allergies: Negative.      Past Medical History:  He,  has a past medical history of Chickenpox, Chronic knee pain, Chronic low back pain, COPD exacerbation ([place]) (10/11/2021), COPD with exacerbation ([place]) (10/12/2021), Coronary artery calcification seen on CAT scan (11/2021), Essential hypertension, GERD (gastroesophageal reflux disease), Hyperlipidemia, Malignant neoplasm of upper lobe of right lung ([place]) (04/04/2020), OSA (obstructive sleep apnea), T4, M3, M1 C Metastatic Small Cell Lung Cancer (03/2020), Testosterone deficiency, and Type 2 diabetes mellitus ([place]).   Surgical History:   Past Surgical History:  Procedure Laterality Date   CHEST TUBE INSERTION Right 01/20/2022   Procedure: CHEST TUBE INSERTION;  Surgeon: Candee Furbish, MD;  Location[facility] ENDOSCOPY;  Service: Pulmonary;  Laterality: Right;  w/ Talc Pleurodesis, planned admit for Obs afterwards   CHEST TUBE INSERTION Right 04/28/2022   Procedure: INSERTION PLEURAL DRAINAGE CATHETER - Pigtail tail, drainage;  Surgeon: Candee Furbish, MD;  Location: [facility] ENDOSCOPY;  Service: Pulmonary;  Laterality: Right;  talc pleurodesis   COLONOSCOPY WITH PROPOFOL N/A 12/17/2018   Procedure: COLONOSCOPY WITH PROPOFOL;  Surgeon: Virgel Manifold, MD;  Location: [facility] ENDOSCOPY;  Service: Gastroenterology;  Laterality: N/A;   IR IMAGING GUIDED PORT INSERTION  04/17/2020   IR THORACENTESIS ASP PLEURAL SPACE W/IMG GUIDE  11/29/2021   IR THORACENTESIS ASP PLEURAL SPACE W/IMG GUIDE  01/03/2022   JOINT REPLACEMENT Bilateral    REPLACEMENT TOTAL KNEE BILATERAL  2015   TONSILLECTOMY  1960   TRANSTHORACIC ECHOCARDIOGRAM  05/2020   a) 05/2020: EF 55 to 60%.  No R WMA.  Mild LVH.  Indeterminate LVEDP.  Unable to assess RVP.  Normal aortic and mitral valves.  Mildly elevated RAP.; b) 09/2021: EF 50-55%. No RWMA. Mild LVH. ~ LVEDP. Mild LA Dilation. NORMAL RV/RAP.  Normal MV/AoV.     Social History:   reports that he has quit smoking. He has never used smokeless tobacco. He reports that he does not currently use alcohol. He reports that he does not currently use drugs.   Family History:  His family history includes Arthritis  in his father, paternal grandfather, paternal grandmother, and sister; Asthma in his father, sister, sister, and son; Birth defects in his maternal grandfather; COPD in his father and sister; Cancer in his father, mother, sister, and sister; Diabetes in his paternal grandmother, sister, and sister; Heart attack in his father and mother; Hypertension in his mother and sister. There is no history of Other.   Allergies Allergies  Allergen Reactions   Bupropion     Racing heart   Hydrochlorothiazide Other (See Comments)    Cramping to lower extremities     Home Medications  Prior to Admission medications   Medication Sig Start Date End Date Taking? Authorizing Provider  albuterol (VENTOLIN HFA) 108 (90 Base) MCG/ACT inhaler Inhale 2 puffs into the lungs every 6 (six) hours as needed for wheezing or shortness of breath. 03/14/20  Yes Nicolette Bang, MD  amLODipine (NORVASC) 10 MG tablet TAKE 1 TABLET BY MOUTH EVERY DAY FOR BLOOD PRESSURE 01/02/22  Yes [name], NP  bisoprolol (ZEBETA) 5 MG tablet  TAKE 1 TABLET (5 MG TOTAL) BY MOUTH DAILY. 05/09/22  Yes Leonie Man, MD  Black Pepper-Turmeric (TURMERIC COMPLEX/BLACK PEPPER PO) Take 1 tablet by mouth in the morning and at bedtime.   Yes [provider]  blood glucose meter kit and supplies KIT Dispense based on patient and insurance preference. Use up to four times daily as directed. 04/09/18  Yes [name], NP  Budeson-Glycopyrrol-Formoterol (BREZTRI AEROSPHERE) 160-9-4.8 MCG/ACT AERO Inhale 2 puffs into the lungs in the morning and at bedtime. 07/23/22  Yes Icard, Octavio Graves, DO  chlorpheniramine-HYDROcodone (TUSSIONEX) 10-8 MG/5ML SUER Take 5 mLs by mouth at bedtime as needed for cough. 12/31/21  Yes Heilingoetter, Cassandra L, PA-C  Coenzyme Q10 (COQ10) 200 MG CAPS Take 200 mg by mouth at bedtime.   Yes [provider]  ELIQUIS 5 MG TABS tablet TAKE 1 TABLET BY MOUTH TWICE A DAY 02/28/22  Yes Heilingoetter, Cassandra L, PA-C  esomeprazole (NEXIUM) 20 MG capsule Take 20 mg by mouth in the morning.   Yes [provider]  gabapentin (NEURONTIN) 300 MG capsule Take 1 capsule (300 mg total) by mouth 2 (two) times daily. For back pain. 04/02/22  Yes [name], NP  glipiZIDE (GLUCOTROL) 10 MG tablet TAKE 1 TABLET (10 MG TOTAL) BY MOUTH 2 (TWO) TIMES DAILY BEFORE A MEAL. FOR DIABETES. 12/01/21  Yes [name], NP  glucose blood (ONETOUCH ULTRA) test strip USE UP TO 4 TIMES DAILY AS DIRECTED 07/29/22  Yes [name], NP  Insulin Glargine (BASAGLAR KWIKPEN) 100 UNIT/ML Inject 24 Units into the skin at bedtime. For diabetes. 06/24/22  Yes [name], NP  Insulin Pen Needle (BD PEN NEEDLE NANO U/F) 32G X 4 MM MISC Use with insulin as prescribed Dx Code: E11.9 05/15/22  Yes [name], NP  ipratropium-albuterol (DUONEB) 0.5-2.5 (3) MG/3ML SOLN Take 3 mLs by nebulization every 4 (four) hours while awake for 3 days, THEN 3 mLs every 4 (four) hours as needed (shortness of breath or  wheezing). 10/13/21 01/18/24 Yes Mariel Aloe, MD  KRILL OIL PO Take 1 capsule by mouth daily.   Yes [provider]  Lancets MISC USE UP TO 3 TIMES DAILY AS DIRECTED 05/06/18  Yes [name], NP  losartan (COZAAR) 50 MG tablet Take 1 tablet (50 mg total) by mouth daily. For blood pressure. 10/18/21  Yes [name], NP  Melatonin 10 MG CAPS  Take 10 mg by mouth at bedtime.   Yes [provider]  metFORMIN (GLUCOPHAGE) 1000 MG tablet TAKE 1 TABLET (1,000 MG TOTAL) BY MOUTH 2 (TWO) TIMES DAILY WITH A MEAL. FOR DIABETES 03/08/22  Yes [name], NP  Multiple Vitamin (MULTI-VITAMINS) TABS Take 1 tablet by mouth in the morning.   Yes [provider]  oxymetazoline (AFRIN) 0.05 % nasal spray Place 1 spray into both nostrils daily as needed for congestion.   Yes [provider]  pioglitazone (ACTOS) 45 MG tablet TAKE 1 TABLET (45 MG TOTAL) BY MOUTH DAILY. FOR DIABETES. 03/08/22  Yes [name], NP  pravastatin (PRAVACHOL) 40 MG tablet TAKE 1 TABLET BY MOUTH EVERY DAY IN THE EVENING FOR CHOLESTEROL 03/08/22  Yes [name], NP  testosterone cypionate (DEPOTESTOSTERONE CYPIONATE) 200 MG/ML injection Inject 200 mg into the muscle every 14 (fourteen) days.   Yes [provider]  B-D 3CC LUER-LOK SYR 22GX1" 22G X 1" 3 ML MISC USE AS INSTRUCTED FOR TESTOSTERONE INJECTION EVERY 2 WEEKS 03/20/21   [name], NP     Critical care time: NA   Erick Colace ACNP-BC [place] Pager # [phone] OR # [phone] if no answer

## 2022-09-01 NOTE — Discharge Instructions (Signed)
Follow up with Dr. Valeta Harms as previously arranged

## 2022-09-01 NOTE — Progress Notes (Addendum)
Received from PACU, alert and oriented, ambulatory. No complained of pain or SOB. Post drain site intact with dressing. As per PACU RN, MD said no need for PIV as he is for observation and he will go home tomorrow. Patient requested no sugar check or insulin because it's always high due to his steroid. He use CPAP at night, offer to use oxygen overnight, but decline.

## 2022-09-02 ENCOUNTER — Telehealth: Payer: Self-pay | Admitting: Medical Oncology

## 2022-09-02 ENCOUNTER — Telehealth: Payer: Self-pay | Admitting: Nurse Practitioner

## 2022-09-02 ENCOUNTER — Other Ambulatory Visit: Payer: Self-pay | Admitting: Physician Assistant

## 2022-09-02 ENCOUNTER — Other Ambulatory Visit: Payer: Self-pay | Admitting: Primary Care

## 2022-09-02 ENCOUNTER — Inpatient Hospital Stay (HOSPITAL_COMMUNITY): Payer: Medicare Other

## 2022-09-02 DIAGNOSIS — J939 Pneumothorax, unspecified: Secondary | ICD-10-CM | POA: Diagnosis present

## 2022-09-02 DIAGNOSIS — I2510 Atherosclerotic heart disease of native coronary artery without angina pectoris: Secondary | ICD-10-CM | POA: Diagnosis not present

## 2022-09-02 DIAGNOSIS — Z85118 Personal history of other malignant neoplasm of bronchus and lung: Secondary | ICD-10-CM | POA: Diagnosis not present

## 2022-09-02 DIAGNOSIS — I10 Essential (primary) hypertension: Secondary | ICD-10-CM

## 2022-09-02 DIAGNOSIS — J90 Pleural effusion, not elsewhere classified: Secondary | ICD-10-CM | POA: Diagnosis not present

## 2022-09-02 DIAGNOSIS — I2692 Saddle embolus of pulmonary artery without acute cor pulmonale: Secondary | ICD-10-CM

## 2022-09-02 DIAGNOSIS — C3490 Malignant neoplasm of unspecified part of unspecified bronchus or lung: Secondary | ICD-10-CM | POA: Diagnosis not present

## 2022-09-02 DIAGNOSIS — E119 Type 2 diabetes mellitus without complications: Secondary | ICD-10-CM | POA: Diagnosis not present

## 2022-09-02 DIAGNOSIS — Z86711 Personal history of pulmonary embolism: Secondary | ICD-10-CM | POA: Diagnosis not present

## 2022-09-02 NOTE — Telephone Encounter (Signed)
D/C today after Talc procedure yesterday with post procedure pneumothorax.   Behavior changes today ."there is something off with Richardson Landry. He is not acting right. He told me he did not get breakfast yesterday and he did.  I was driving him home today at 1 pm and I came to a detour and I was not sure where to go . Normally , Richardson Landry, has a hot temper with me and my driving: he would yell at me and tell me how to drive. This was odd because he  was quiet and did not say a word about what I was doing". When she got home, he could not figure out to unbuckle his seatbelt. He did walk into the house by himself and went to couch and is asleep.   She states that she feels safe.  But,she  did move his gun from under the bed.   I instructed her to monitor him closely and get him to ED or call EMS. She stated she will monitor him for now.

## 2022-09-02 NOTE — TOC CM/SW Note (Addendum)
Received order for a home health RN. Discussed with patient. Patient declined. Secure chatted Jennelle Human and bedside nurse .Both received message    Transition of Care (TOC) Screening Note     Transition of Care Department (TOC) has reviewed patient and no TOC needs have been identified at this time. We will continue to monitor patient advancement through interdisciplinary progression rounds. If new patient transition needs arise, please place a TOC consult.

## 2022-09-02 NOTE — Discharge Summary (Signed)
Physician Discharge Summary         Patient ID: Adam Wise MRN: [id number] DOB/AGE: August 16, 1951 71 y.o.  Admit date: 09/01/2022 Discharge date: 09/02/2022  Discharge Diagnoses:    Active Hospital Problems   Diagnosis Date Noted   Recurrent pleural effusion 08/27/2022   Pneumothorax on right 09/01/2022    Resolved Hospital Problems  No resolved problems to display.      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  Presented to procedural lab 9/18 for scheduled 3rd Talc Pleurodesis. Underwent successful Small bore tube thoracostomy with 1300 ml pleural fluid drained, then subsequently underwent talc pleurodesis. His chest tube was removed. A follow up CXR was obtained and showed very small right PTX, estimated 10%. Adam Wise was admitted for observation.  No events overnight.  Repeated CXR in the morning, showing improvement in right apical PTX, now estimated ~5%.  He is stable for discharge home.    Discharge Plan by Active Problems    Post-procedural Right PTX. Favor ex-vacuo given the chronicity of the pleural fluid collection  Plan AM CXR stable.  Patient verbalizes understanding to see emergency care if severe respiratory distress or sharp chest pain.   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    H/o PE Plan Resume DOAC    COPD  Plan Cont BD/ICS   HTN Plan Cont home meds    GERD Plan PPI   DM type II Plan Cont home oral meds   HL Plan Cont home meds  Significant Hospital tests/ studies   Procedures   9/18 Right pigtail chest tube f/b talc pleurodesis   Culture data/antimicrobials     Consults     Discharge Exam: BP 107/64 (BP Location: Right  Arm)   Pulse 93   Temp 98.1 F (36.7 C) (Oral)   Resp 16   Ht 5' 9.49" (1.765 m)   Wt 135.4 kg   SpO2 94%   BMI 43.46 kg/m    General:  Pleasant older male lying in bed in NAD HEENT: MM pink/moist Neuro: alert, oriented, non focal, MAE CV: rr, no murmur PULM:  non labored, CTA GI: soft, obese, +bs Extremities: warm/dry, no LE edema     Labs at discharge   Lab Results  Component Value Date   CREATININE 1.10 08/27/2022   BUN 22 08/27/2022   NA 140 08/27/2022   K 4.7 08/27/2022   CL 105 08/27/2022   CO2 27 08/27/2022   Lab Results  Component Value Date   WBC 3.5 (L) 08/27/2022   HGB 9.0 (L) 08/27/2022   HCT 27.8 (L) 08/27/2022   MCV 93.0 08/27/2022   PLT 146 (L) 08/27/2022   Lab Results  Component Value Date   ALT 18 08/27/2022   AST 20 08/27/2022   ALKPHOS 57 08/27/2022   BILITOT 0.5 08/27/2022   Lab Results  Component Value Date   INR 1.0 10/11/2021   INR 1.0 05/15/2020    Current radiological studies    DG CHEST PORT 1 VIEW  Result Date: 09/02/2022 CLINICAL DATA:  [id number] with recurrent right pleural effusion, previous XRT for lung cancer. EXAM: PORTABLE CHEST 1 VIEW COMPARISON:  Portable chest yesterday at 5:23 p.m. Chest CT with IV contrast 08/15/2022. FINDINGS: 5:41 a.m. There continues to be a small right apical pneumothorax estimated up to 5% chest volume. This is unchanged in the interval. Consolidation of the right lung base continues to be seen as well, with right upper lobe paramediastinal radiation fibrosis also similar appearing. There is no contralateral mediastinal shift. Remaining lungs are clear. Heart size and vasculature are normal. There is a stable mediastinum, stable small right pleural effusion. Also stable right IJ port catheter insertion with tip in the right atrium. IMPRESSION: 1. Small right apical pneumothorax estimated up to 5% volume, unchanged. 2. Small right pleural effusion with basilar consolidation/atelectasis. 3. Unchanged  right suprahilar radiation fibrosis. Electronically Signed   By: Telford Nab M.D.   On: 09/02/2022 07:41   DG CHEST PORT 1 VIEW  Result Date: 09/01/2022 CLINICAL DATA:  Chest tube removal EXAM: PORTABLE CHEST 1 VIEW COMPARISON:  09/01/2022 at 3:17 p.m. FINDINGS: Single frontal view of the chest demonstrates interval removal of the right pigtail drainage catheter. There is a small recurrent right apical pneumothorax volume estimated approximately 10%. Consolidation at the right lung base likely reflects atelectasis. Post radiation changes in the right suprahilar region again noted. No tension effect. No effusion. Left chest is clear. Stable right chest wall port. No acute bony abnormalities. IMPRESSION: 1. Small right apical pneumothorax after right chest tube removal, volume estimated at least 10%. No mediastinal shift or tension effect. 2. Right basilar atelectasis. 3. Stable post radiation changes right suprahilar region. Critical Value/emergent results were called by telephone at the time of interpretation on 09/01/2022 at 5:56 pm to provider [facility] , who verbally acknowledged these results. Electronically Signed   By: Randa Ngo M.D.   On: 09/01/2022 17:58   DG Chest Port 1 View  Result Date: 09/01/2022 CLINICAL DATA:  Chest tube placement. EXAM: PORTABLE CHEST 1 VIEW COMPARISON:  05/27/2022.  CT, 08/15/2022. FINDINGS: Right inferior hemithorax pigtail chest tube has been placed since the previous CT. There has been a significant decrease in right pleural fluid. Right hilar and medial upper lobe opacity consistent with radiation induced scarring is stable. Remainder of the right lung is clear. No pneumothorax. Clear left lung.  No left pleural effusion. Right anterior chest wall Port-A-Cath is stable. IMPRESSION: 1. Status post right sided chest tube placement with the chest tube lying in the lower hemithorax above the right hemidiaphragm. Decreased right pleural fluid following chest tube  placement. 2. No pneumothorax. Electronically Signed   By: Lajean Manes M.D.   On: 09/01/2022 15:42    Disposition:    Discharge disposition: 01-Home or [place] w/wife    Allergies as of 09/02/2022       Reactions   Bupropion    Racing heart   Hydrochlorothiazide Other (See Comments)   Cramping to lower extremities        Medication List     TAKE these medications    albuterol 108 (90 Base) MCG/ACT inhaler Commonly known as: VENTOLIN HFA Inhale 2 puffs into the lungs every 6 (six) hours as needed for wheezing or shortness of breath.   amLODipine 10 MG tablet Commonly known as: NORVASC TAKE 1 TABLET BY MOUTH EVERY DAY FOR BLOOD PRESSURE   B-D 3CC LUER-LOK SYR 22GX1" 22G X 1" 3 ML Misc Generic drug: SYRINGE-NEEDLE (DISP) 3 ML USE AS INSTRUCTED FOR TESTOSTERONE INJECTION EVERY 2 WEEKS   Basaglar KwikPen 100 UNIT/ML Inject  24 Units into the skin at bedtime. For diabetes.   BD Pen Needle Nano U/F 32G X 4 MM Misc Generic drug: Insulin Pen Needle Use with insulin as prescribed Dx Code: E11.9   bisoprolol 5 MG tablet Commonly known as: ZEBETA TAKE 1 TABLET (5 MG TOTAL) BY MOUTH DAILY.   blood glucose meter kit and supplies Kit Dispense based on patient and insurance preference. Use up to four times daily as directed.   Breztri Aerosphere 160-9-4.8 MCG/ACT Aero Generic drug: Budeson-Glycopyrrol-Formoterol Inhale 2 puffs into the lungs in the morning and at bedtime.   chlorpheniramine-HYDROcodone 10-8 MG/5ML Suer Commonly known as: TUSSIONEX Take 5 mLs by mouth at bedtime as needed for cough.   CoQ10 200 MG Caps Take 200 mg by mouth at bedtime.   Eliquis 5 MG Tabs tablet Generic drug: apixaban TAKE 1 TABLET BY MOUTH TWICE A DAY   esomeprazole 20 MG capsule Commonly known as: NEXIUM Take 20 mg by mouth in the morning.   gabapentin 300 MG capsule Commonly known as: NEURONTIN Take 1 capsule (300 mg total) by mouth 2 (two) times daily. For back  pain.   glipiZIDE 10 MG tablet Commonly known as: GLUCOTROL TAKE 1 TABLET (10 MG TOTAL) BY MOUTH 2 (TWO) TIMES DAILY BEFORE A MEAL. FOR DIABETES.   ipratropium-albuterol 0.5-2.5 (3) MG/3ML Soln Commonly known as: DUONEB Take 3 mLs by nebulization every 4 (four) hours while awake for 3 days, THEN 3 mLs every 4 (four) hours as needed (shortness of breath or wheezing). Start taking on: October 13, 2021   KRILL OIL PO Take 1 capsule by mouth daily.   Lancets Misc USE UP TO 3 TIMES DAILY AS DIRECTED   losartan 50 MG tablet Commonly known as: COZAAR TAKE 1 TABLET (50 MG TOTAL) BY MOUTH DAILY. FOR BLOOD PRESSURE.   Melatonin 10 MG Caps Take 10 mg by mouth at bedtime.   metFORMIN 1000 MG tablet Commonly known as: GLUCOPHAGE TAKE 1 TABLET (1,000 MG TOTAL) BY MOUTH 2 (TWO) TIMES DAILY WITH A MEAL. FOR DIABETES   Multi-Vitamins Tabs Take 1 tablet by mouth in the morning.   OneTouch Ultra test strip Generic drug: glucose blood USE UP TO 4 TIMES DAILY AS DIRECTED   oxymetazoline 0.05 % nasal spray Commonly known as: AFRIN Place 1 spray into both nostrils daily as needed for congestion.   pioglitazone 45 MG tablet Commonly known as: ACTOS TAKE 1 TABLET (45 MG TOTAL) BY MOUTH DAILY. FOR DIABETES.   pravastatin 40 MG tablet Commonly known as: PRAVACHOL TAKE 1 TABLET BY MOUTH EVERY DAY IN THE EVENING FOR CHOLESTEROL   testosterone cypionate 200 MG/ML injection Commonly known as: DEPOTESTOSTERONE CYPIONATE Inject 200 mg into the muscle every 14 (fourteen) days.   TURMERIC COMPLEX/BLACK PEPPER PO Take 1 tablet by mouth in the morning and at bedtime.         Follow-up appointment   Pulmonary office will call for f/u   Discharge Condition:    stable     Kennieth Rad, MSN, AG-ACNP-BC [facility] 09/02/2022, 10:24 AM  See Amion for pager If no response to pager, please call PCCM consult pager After 7:00 pm call Elink

## 2022-09-03 ENCOUNTER — Inpatient Hospital Stay: Payer: Medicare Other

## 2022-09-03 ENCOUNTER — Other Ambulatory Visit: Payer: Self-pay | Admitting: Medical Oncology

## 2022-09-03 ENCOUNTER — Telehealth: Payer: Self-pay | Admitting: Medical Oncology

## 2022-09-03 ENCOUNTER — Encounter: Payer: Self-pay | Admitting: Internal Medicine

## 2022-09-03 ENCOUNTER — Other Ambulatory Visit: Payer: Self-pay

## 2022-09-03 DIAGNOSIS — J90 Pleural effusion, not elsewhere classified: Secondary | ICD-10-CM | POA: Diagnosis not present

## 2022-09-03 DIAGNOSIS — C3411 Malignant neoplasm of upper lobe, right bronchus or lung: Secondary | ICD-10-CM | POA: Diagnosis not present

## 2022-09-03 DIAGNOSIS — D649 Anemia, unspecified: Secondary | ICD-10-CM | POA: Diagnosis not present

## 2022-09-03 DIAGNOSIS — C787 Secondary malignant neoplasm of liver and intrahepatic bile duct: Secondary | ICD-10-CM | POA: Diagnosis not present

## 2022-09-03 DIAGNOSIS — Z5111 Encounter for antineoplastic chemotherapy: Secondary | ICD-10-CM | POA: Diagnosis not present

## 2022-09-03 DIAGNOSIS — R4189 Other symptoms and signs involving cognitive functions and awareness: Secondary | ICD-10-CM

## 2022-09-03 DIAGNOSIS — K5900 Constipation, unspecified: Secondary | ICD-10-CM | POA: Diagnosis not present

## 2022-09-03 LAB — CBC WITH DIFFERENTIAL (CANCER CENTER ONLY)
Abs Immature Granulocytes: 0.02 10*3/uL (ref 0.00–0.07)
Basophils Absolute: 0 10*3/uL (ref 0.0–0.1)
Basophils Relative: 1 %
Eosinophils Absolute: 0 10*3/uL (ref 0.0–0.5)
Eosinophils Relative: 1 %
HCT: 26.2 % — ABNORMAL LOW (ref 39.0–52.0)
Hemoglobin: 8.3 g/dL — ABNORMAL LOW (ref 13.0–17.0)
Immature Granulocytes: 1 %
Lymphocytes Relative: 11 %
Lymphs Abs: 0.5 10*3/uL — ABNORMAL LOW (ref 0.7–4.0)
MCH: 30.4 pg (ref 26.0–34.0)
MCHC: 31.7 g/dL (ref 30.0–36.0)
MCV: 96 fL (ref 80.0–100.0)
Monocytes Absolute: 0.8 10*3/uL (ref 0.1–1.0)
Monocytes Relative: 20 %
Neutro Abs: 2.8 10*3/uL (ref 1.7–7.7)
Neutrophils Relative %: 66 %
Platelet Count: 78 10*3/uL — ABNORMAL LOW (ref 150–400)
RBC: 2.73 MIL/uL — ABNORMAL LOW (ref 4.22–5.81)
RDW: 19.4 % — ABNORMAL HIGH (ref 11.5–15.5)
WBC Count: 4.1 10*3/uL (ref 4.0–10.5)
nRBC: 0 % (ref 0.0–0.2)

## 2022-09-03 LAB — CMP (CANCER CENTER ONLY)
ALT: 17 U/L (ref 0–44)
AST: 19 U/L (ref 15–41)
Albumin: 3.6 g/dL (ref 3.5–5.0)
Alkaline Phosphatase: 49 U/L (ref 38–126)
Anion gap: 13 (ref 5–15)
BUN: 30 mg/dL — ABNORMAL HIGH (ref 8–23)
CO2: 23 mmol/L (ref 22–32)
Calcium: 7.6 mg/dL — ABNORMAL LOW (ref 8.9–10.3)
Chloride: 101 mmol/L (ref 98–111)
Creatinine: 1.78 mg/dL — ABNORMAL HIGH (ref 0.61–1.24)
GFR, Estimated: 40 mL/min — ABNORMAL LOW (ref >60)
Glucose, Bld: 157 mg/dL — ABNORMAL HIGH (ref 70–99)
Potassium: 4.1 mmol/L (ref 3.5–5.1)
Sodium: 137 mmol/L (ref 135–145)
Total Bilirubin: 0.6 mg/dL (ref 0.3–1.2)
Total Protein: 6.9 g/dL (ref 6.5–8.1)

## 2022-09-03 NOTE — Telephone Encounter (Signed)
Cancel MRI-Wife stated pt  and his dtr were engaged in a conversation this am and he seemed normal .   She does not want him to have MRI now. She wants to wait to see  Cassie next week . I again reiterated he needs to go to ED for any concerning changes.She voiced understanding

## 2022-09-03 NOTE — Telephone Encounter (Signed)
Patient scheduled with TP on 09/09/2022 at 11:30am- nothing further needed.

## 2022-09-05 ENCOUNTER — Ambulatory Visit (HOSPITAL_BASED_OUTPATIENT_CLINIC_OR_DEPARTMENT_OTHER): Payer: Medicare Other

## 2022-09-05 ENCOUNTER — Telehealth: Payer: Self-pay

## 2022-09-05 NOTE — Telephone Encounter (Signed)
-----   Message from Curt Bears, MD sent at 09/03/2022  4:59 PM EDT ----- Please advise him to increase his hydration.  His creatinine level is rising up.  Thank you ----- Message ----- From: Interface, Lab In [place] Sent: 09/03/2022   1:40 PM EDT To: Curt Bears, MD

## 2022-09-05 NOTE — Telephone Encounter (Signed)
I spoke with pts wife who advised pt has increased his oral fluid intake and is feeling better. She advised no further assistance is needed at this time.

## 2022-09-06 ENCOUNTER — Other Ambulatory Visit: Payer: Self-pay | Admitting: Primary Care

## 2022-09-06 DIAGNOSIS — G8929 Other chronic pain: Secondary | ICD-10-CM

## 2022-09-07 ENCOUNTER — Other Ambulatory Visit: Payer: Self-pay | Admitting: Primary Care

## 2022-09-07 DIAGNOSIS — E119 Type 2 diabetes mellitus without complications: Secondary | ICD-10-CM

## 2022-09-07 DIAGNOSIS — E785 Hyperlipidemia, unspecified: Secondary | ICD-10-CM

## 2022-09-07 DIAGNOSIS — E1169 Type 2 diabetes mellitus with other specified complication: Secondary | ICD-10-CM

## 2022-09-08 ENCOUNTER — Telehealth: Payer: Self-pay | Admitting: Physician Assistant

## 2022-09-08 ENCOUNTER — Telehealth: Payer: Self-pay

## 2022-09-08 NOTE — Telephone Encounter (Signed)
Called patient regarding upcoming September appointments, patient is notified.

## 2022-09-08 NOTE — Progress Notes (Signed)
Chronic Care Management Pharmacy Assistant   Name: Adam Wise  MRN: [id number] DOB: 09-09-1951  Reason for Encounter: Non-CCM [facility] Follow-Up)   Medications: Outpatient Encounter Medications as of 09/08/2022  Medication Sig   albuterol (VENTOLIN HFA) 108 (90 Base) MCG/ACT inhaler Inhale 2 puffs into the lungs every 6 (six) hours as needed for wheezing or shortness of breath.   amLODipine (NORVASC) 10 MG tablet TAKE 1 TABLET BY MOUTH EVERY DAY FOR BLOOD PRESSURE   B-D 3CC LUER-LOK SYR 22GX1" 22G X 1" 3 ML MISC USE AS INSTRUCTED FOR TESTOSTERONE INJECTION EVERY 2 WEEKS   bisoprolol (ZEBETA) 5 MG tablet TAKE 1 TABLET (5 MG TOTAL) BY MOUTH DAILY.   Black Pepper-Turmeric (TURMERIC COMPLEX/BLACK PEPPER PO) Take 1 tablet by mouth in the morning and at bedtime.   blood glucose meter kit and supplies KIT Dispense based on patient and insurance preference. Use up to four times daily as directed.   Budeson-Glycopyrrol-Formoterol (BREZTRI AEROSPHERE) 160-9-4.8 MCG/ACT AERO Inhale 2 puffs into the lungs in the morning and at bedtime.   chlorpheniramine-HYDROcodone (TUSSIONEX) 10-8 MG/5ML SUER Take 5 mLs by mouth at bedtime as needed for cough.   Coenzyme Q10 (COQ10) 200 MG CAPS Take 200 mg by mouth at bedtime.   ELIQUIS 5 MG TABS tablet TAKE 1 TABLET BY MOUTH TWICE A DAY   esomeprazole (NEXIUM) 20 MG capsule Take 20 mg by mouth in the morning.   gabapentin (NEURONTIN) 300 MG capsule TAKE 1 CAPSULE (300 MG TOTAL) BY MOUTH 2 (TWO) TIMES DAILY. FOR BACK PAIN.   glipiZIDE (GLUCOTROL) 10 MG tablet TAKE 1 TABLET (10 MG TOTAL) BY MOUTH 2 (TWO) TIMES DAILY BEFORE A MEAL. FOR DIABETES.   glucose blood (ONETOUCH ULTRA) test strip USE UP TO 4 TIMES DAILY AS DIRECTED   Insulin Glargine (BASAGLAR KWIKPEN) 100 UNIT/ML Inject 24 Units into the skin at bedtime. For diabetes.   Insulin Pen Needle (BD PEN NEEDLE NANO U/F) 32G X 4 MM MISC Use with insulin as prescribed Dx Code: E11.9   ipratropium-albuterol  (DUONEB) 0.5-2.5 (3) MG/3ML SOLN Take 3 mLs by nebulization every 4 (four) hours while awake for 3 days, THEN 3 mLs every 4 (four) hours as needed (shortness of breath or wheezing).   KRILL OIL PO Take 1 capsule by mouth daily.   Lancets MISC USE UP TO 3 TIMES DAILY AS DIRECTED   losartan (COZAAR) 50 MG tablet TAKE 1 TABLET (50 MG TOTAL) BY MOUTH DAILY. FOR BLOOD PRESSURE.   Melatonin 10 MG CAPS Take 10 mg by mouth at bedtime.   metFORMIN (GLUCOPHAGE) 1000 MG tablet TAKE 1 TABLET (1,000 MG TOTAL) BY MOUTH 2 (TWO) TIMES DAILY WITH A MEAL. FOR DIABETES   Multiple Vitamin (MULTI-VITAMINS) TABS Take 1 tablet by mouth in the morning.   oxymetazoline (AFRIN) 0.05 % nasal spray Place 1 spray into both nostrils daily as needed for congestion.   pioglitazone (ACTOS) 45 MG tablet TAKE 1 TABLET (45 MG TOTAL) BY MOUTH DAILY. FOR DIABETES.   pravastatin (PRAVACHOL) 40 MG tablet TAKE 1 TABLET BY MOUTH EVERY DAY IN THE EVENING FOR CHOLESTEROL   testosterone cypionate (DEPOTESTOSTERONE CYPIONATE) 200 MG/ML injection Inject 200 mg into the muscle every 14 (fourteen) days.   No facility-administered encounter medications on file as of 09/08/2022.   Reviewed hospital notes for details of recent visit. Has patient been contacted by Transitions of Care team? No Has patient seen PCP/specialist for hospital follow up (summarize OV if yes): No  Admitted to  the hospital on 09/01/2022. Discharge date was 09/02/2022.  Discharged from [facility].   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  Presented to procedural lab 9/18 for scheduled 3rd Talc Pleurodesis. Underwent successful Small bore tube thoracostomy with 1300 ml  pleural fluid drained, then subsequently underwent talc pleurodesis. His chest tube was removed. A follow up CXR was obtained and showed very small right PTX, estimated 10%. Adam Wise was admitted for observation.  No events overnight.  Repeated CXR in the morning, showing improvement in right apical PTX, now estimated ~5%.  He is stable for discharge home.    Medications that remain the same after Hospital Discharge:??  -All other medications will remain the same.    Next CCM appt: Non-CCM  Other upcoming appts: Pulmonology appointment with on 09/09/2022 PCP appointment on 09/24/2022  Charlene Brooke, PharmD notified and will determine if action is needed.  Charlene Brooke, CPP notified  Marijean Niemann, [place] Clinical Pharmacy Assistant [phone]

## 2022-09-09 ENCOUNTER — Ambulatory Visit (INDEPENDENT_AMBULATORY_CARE_PROVIDER_SITE_OTHER): Payer: Medicare Other | Admitting: Adult Health

## 2022-09-09 ENCOUNTER — Ambulatory Visit (INDEPENDENT_AMBULATORY_CARE_PROVIDER_SITE_OTHER): Payer: Medicare Other

## 2022-09-09 ENCOUNTER — Encounter: Payer: Self-pay | Admitting: Adult Health

## 2022-09-09 VITALS — BP 128/66 | HR 102 | Temp 98.4°F | Ht 69.5 in | Wt 295.6 lb

## 2022-09-09 DIAGNOSIS — J939 Pneumothorax, unspecified: Secondary | ICD-10-CM

## 2022-09-09 DIAGNOSIS — C3411 Malignant neoplasm of upper lobe, right bronchus or lung: Secondary | ICD-10-CM | POA: Diagnosis not present

## 2022-09-09 DIAGNOSIS — G4733 Obstructive sleep apnea (adult) (pediatric): Secondary | ICD-10-CM | POA: Diagnosis not present

## 2022-09-09 DIAGNOSIS — C3490 Malignant neoplasm of unspecified part of unspecified bronchus or lung: Secondary | ICD-10-CM | POA: Diagnosis not present

## 2022-09-09 DIAGNOSIS — J90 Pleural effusion, not elsewhere classified: Secondary | ICD-10-CM | POA: Diagnosis not present

## 2022-09-09 MED FILL — Fosaprepitant Dimeglumine For IV Infusion 150 MG (Base Eq): INTRAVENOUS | Qty: 5 | Status: AC

## 2022-09-09 MED FILL — Dexamethasone Sodium Phosphate Inj 100 MG/10ML: INTRAMUSCULAR | Qty: 1 | Status: AC

## 2022-09-09 NOTE — Patient Instructions (Addendum)
Begin Oxygen 2l/m.  Continue on Breztri 2 puffs Twice daily  ,rinse after use.  Albuterol inhaler As needed   Activity as tolerated.  Follow up in 1 week with Dr. Valeta Harms or Willman Cuny NP with Chest xray  Please contact office for sooner follow up if symptoms do not improve or worsen or seek emergency care

## 2022-09-09 NOTE — Assessment & Plan Note (Signed)
Recurrent right pleural effusion status post multiple thoracentesis and talc pleurodesis via chest tube.  Chest x-ray today shows persistent small right pleural effusion and a small right apical pneumothorax. For now he is clinically stable.  Hold on additional thoracentesis at this time. We will consider repeat therapeutic thoracentesis as indicated  Plan Patient Instructions  Begin Oxygen 2l/m.  Continue on Breztri 2 puffs Twice daily  ,rinse after use.  Albuterol inhaler As needed   Activity as tolerated.  Follow up in 1 week with Dr. Valeta Harms or Carolyn Sylvia NP with Chest xray  Please contact office for sooner follow up if symptoms do not improve or worsen or seek emergency care

## 2022-09-09 NOTE — Assessment & Plan Note (Signed)
Continue follow-up with oncology.  Plans to restart chemotherapy later this week.

## 2022-09-09 NOTE — Assessment & Plan Note (Signed)
Continue on CPAP at bedtime with oxygen

## 2022-09-09 NOTE — Progress Notes (Unsigned)
[facility] OFFICE PROGRESS NOTE  [name], NP [place] [address]  DIAGNOSIS: Relapsed extensive stage (T4, N3, M1c)  small cell lung cancer diagnosed in April 2021 and presented with large right upper lobe/right hilar mass with ipsilateral and contralateral mediastinal and right supraclavicular lymphadenopathy in addition to multiple liver lesions. He has disease progression after the first line of chemotherapy in December 2021.  PRIOR THERAPY: 1) Palliative radiotherapy to the right upper lobe lung mass under the care of Dr. Lisbeth Renshaw. 2) Systemic chemotherapy with carboplatin for AUC of 5 on day 1, etoposide 100 mg/M2 on days 1, 2 and 3 in addition to Imfinzi 1500 mg IV every 3 weeks with chemotherapy then every 4 weeks for maintenance if the patient has no evidence for progression.  He will also receive Cosela 240 mg/m2 on the days of the chemotherapy.  Status post 9 cycles.  Starting from cycle #5 the patient will be on maintenance treatment with immunotherapy with Imfinzi 1500 mg IV every 4 weeks. Last dose of chemotherapy was given on November 13, 2020. This treatment was discontinued secondary to disease progression 3) Systemic chemotherapy with carboplatin for AUC of 5 on day 1, etoposide 100 mg/M2 on days 1, 2 and 3, Tecentriq 1200 mg IV every 3 weeks as well as Cosela 250 mg/M2 on the days of the chemotherapy every 3 weeks.  First dose December 18, 2020.  Status post 8 cycles. 4) Zepzelca 3.2 mgm2 IV every 3 weeks. Last dose on 01/23/22. Status post 12 cycles 5) Palliative systemic chemotherapy with irinotecan 65 mg/m2 on days 1 and 8 IV every 3 weeks.  Status post 3 cycles.  Last dose was given April 01, 2022 discontinued secondary to disease progression  CURRENT THERAPY: Patient referred to [facility] for ETCTN 83662 clinical trial which he declined due to travel burden. We are trying again with palliative systemic chemotherapy with carboplatin for an AC of 5  on day 1, etoposide 100 mg per metered squared on days 1, 2, and 3 with Cosela before chemotherapy.  First dose on 05/28/22.  Status post 5 cycles.  INTERVAL HISTORY: Adam Wise 71 y.o. male returns to the clinic today for a follow-up visit accompanied by his wife.  The patient was last seen by Dr. Julien Nordmann on 08/20/2022.  The plan is for the patient to undergo 6 cycles of chemotherapy.  He is here for cycle #6 today.  In the interval since last being seen, he had a repeat talc pleurodesis which was performed on 09/01/2022.  The patient states that they obtained 1.7 L of fluid.  He had a small apical pneumothorax and was admitted overnight for observation.  He had some improvement in his shortness of breath for approximately 1 day.  The patient thinks that he got dehydrated as he had some behavior changes the following day.  The patient's wife mentioned that he forgot how to unbuckle his seatbelt and he was not acting like himself.  Dr. Julien Nordmann recommended a brain MRI.  Reports intermittent lightheadedness and balance changes which has been going on for "a long time".  He thought perhaps this was vertigo.  He follow-up with pulmonology yesterday on 09/09/2022 and appears that his pneumothorax has continued to improve.  He continues to have a pleural effusion.  Pulmonology recommended that he wear 2 L of oxygen during the day.  The patient is having some painful cough and pleuritic chest discomfort.  He is wondering if  something could be prescribed short-term for pain.  He also is taking gabapentin.  He has a follow-up with pulmonology next week.Marland Kitchen  He denies fevers, chills, night sweats, or unexplained weight loss.  Denies any cough or hemoptysis.  He denies any abnormal bleeding or bruising.  The patient's blood pressure has been lower than his typical baseline recently.  The patient is going to monitor his blood pressure closely at home and talk to his family doctor at his upcoming appointment in early October.   Denies any nausea, vomiting, or diarrhea.  He sometimes has mild constipation.  Denies any headache or visual changes.  He is here today for evaluation and repeat blood work before starting cycle #6.    MEDICAL HISTORY: Past Medical History:  Diagnosis Date   Chickenpox    Chronic knee pain    Chronic low back pain    COPD exacerbation ([place]) 10/11/2021   COPD with exacerbation ([place]) 10/12/2021   Coronary artery calcification seen on CAT scan 11/2021   Coronary CTA 11/27/2021: Moderate to large right pleural effusion and compressive atelectasis right lung base. => Coronary Calcium Score 657.  Diffuse RCA calcification.  Minimal mild disease in the LAD and diagonal branches. == Overall limited study.  Notable artifact.   Essential hypertension    GERD (gastroesophageal reflux disease)    Hyperlipidemia    Malignant neoplasm of upper lobe of right lung ([place]) 04/04/2020   OSA (obstructive sleep apnea)    With nighttime oxygen supplementation   T4, M3, M1 C Metastatic Small Cell Lung Cancer 03/2020   large right upper lobe/right hilar mass with ipsilateral and contralateral mediastinal and right supraclavicular lymphadenopathy in addition to multiple liver lesios. He has disease progression after the first line of chemotherapy in December 2021.   Testosterone deficiency    Type 2 diabetes mellitus (HCC)     ALLERGIES:  is allergic to bupropion and hydrochlorothiazide.  MEDICATIONS:  Current Outpatient Medications  Medication Sig Dispense Refill   traMADol (ULTRAM) 50 MG tablet Take 1 tablet (50 mg total) by mouth every 6 (six) hours as needed. 15 tablet 0   albuterol (VENTOLIN HFA) 108 (90 Base) MCG/ACT inhaler Inhale 2 puffs into the lungs every 6 (six) hours as needed for wheezing or shortness of breath. 18 g 0   amLODipine (NORVASC) 10 MG tablet TAKE 1 TABLET BY MOUTH EVERY DAY FOR BLOOD PRESSURE 90 tablet 3   B-D 3CC LUER-LOK SYR 22GX1" 22G X 1" 3 ML MISC USE AS INSTRUCTED FOR  TESTOSTERONE INJECTION EVERY 2 WEEKS 10 each 2   bisoprolol (ZEBETA) 5 MG tablet TAKE 1 TABLET (5 MG TOTAL) BY MOUTH DAILY. 90 tablet 2   Black Pepper-Turmeric (TURMERIC COMPLEX/BLACK PEPPER PO) Take 1 tablet by mouth in the morning and at bedtime.     blood glucose meter kit and supplies KIT Dispense based on patient and insurance preference. Use up to four times daily as directed. 1 each 0   Budeson-Glycopyrrol-Formoterol (BREZTRI AEROSPHERE) 160-9-4.8 MCG/ACT AERO Inhale 2 puffs into the lungs in the morning and at bedtime. 10.7 g 3   chlorpheniramine-HYDROcodone (TUSSIONEX) 10-8 MG/5ML SUER Take 5 mLs by mouth at bedtime as needed for cough. 473 mL 0   Coenzyme Q10 (COQ10) 200 MG CAPS Take 200 mg by mouth at bedtime.     ELIQUIS 5 MG TABS tablet TAKE 1 TABLET BY MOUTH TWICE A DAY 180 tablet 2   esomeprazole (NEXIUM) 20 MG capsule Take 20 mg by mouth in  the morning.     gabapentin (NEURONTIN) 300 MG capsule TAKE 1 CAPSULE (300 MG TOTAL) BY MOUTH 2 (TWO) TIMES DAILY. FOR BACK PAIN. 180 capsule 2   glipiZIDE (GLUCOTROL) 10 MG tablet TAKE 1 TABLET (10 MG TOTAL) BY MOUTH 2 (TWO) TIMES DAILY BEFORE A MEAL. FOR DIABETES. 180 tablet 0   glucose blood (ONETOUCH ULTRA) test strip USE UP TO 4 TIMES DAILY AS DIRECTED 400 strip 5   Insulin Glargine (BASAGLAR KWIKPEN) 100 UNIT/ML Inject 24 Units into the skin at bedtime. For diabetes. 30 mL 1   Insulin Pen Needle (BD PEN NEEDLE NANO U/F) 32G X 4 MM MISC Use with insulin as prescribed Dx Code: E11.9 200 each 3   ipratropium-albuterol (DUONEB) 0.5-2.5 (3) MG/3ML SOLN Take 3 mLs by nebulization every 4 (four) hours while awake for 3 days, THEN 3 mLs every 4 (four) hours as needed (shortness of breath or wheezing). 360 mL 0   KRILL OIL PO Take 1 capsule by mouth daily.     Lancets MISC USE UP TO 3 TIMES DAILY AS DIRECTED 100 each 2   losartan (COZAAR) 50 MG tablet TAKE 1 TABLET (50 MG TOTAL) BY MOUTH DAILY. FOR BLOOD PRESSURE. 90 tablet 2   Melatonin 10 MG CAPS  Take 10 mg by mouth at bedtime.     metFORMIN (GLUCOPHAGE) 1000 MG tablet TAKE 1 TABLET (1,000 MG TOTAL) BY MOUTH 2 (TWO) TIMES DAILY WITH A MEAL. FOR DIABETES 180 tablet 0   Multiple Vitamin (MULTI-VITAMINS) TABS Take 1 tablet by mouth in the morning.     oxymetazoline (AFRIN) 0.05 % nasal spray Place 1 spray into both nostrils daily as needed for congestion.     pioglitazone (ACTOS) 45 MG tablet TAKE 1 TABLET (45 MG TOTAL) BY MOUTH DAILY. FOR DIABETES. 90 tablet 0   pravastatin (PRAVACHOL) 40 MG tablet TAKE 1 TABLET BY MOUTH EVERY DAY IN THE EVENING FOR CHOLESTEROL 90 tablet 2   testosterone cypionate (DEPOTESTOSTERONE CYPIONATE) 200 MG/ML injection Inject 200 mg into the muscle every 14 (fourteen) days.     No current facility-administered medications for this visit.    SURGICAL HISTORY:  Past Surgical History:  Procedure Laterality Date   CHEST TUBE INSERTION Right 01/20/2022   Procedure: CHEST TUBE INSERTION;  Surgeon: Candee Furbish, MD;  Location: [facility] ENDOSCOPY;  Service: Pulmonary;  Laterality: Right;  w/ Talc Pleurodesis, planned admit for Obs afterwards   CHEST TUBE INSERTION Right 04/28/2022   Procedure: INSERTION PLEURAL DRAINAGE CATHETER - Pigtail tail, drainage;  Surgeon: Candee Furbish, MD;  Location: [facility] ENDOSCOPY;  Service: Pulmonary;  Laterality: Right;  talc pleurodesis   CHEST TUBE INSERTION Right 09/01/2022   Procedure: INSERTION PLEURAL DRAINAGE CATHETER;  Surgeon: Juanito Doom, MD;  Location: [facility];  Service: Cardiopulmonary;  Laterality: Right;  pigtail catheter placement with talc pleurodesis   COLONOSCOPY WITH PROPOFOL N/A 12/17/2018   Procedure: COLONOSCOPY WITH PROPOFOL;  Surgeon: Virgel Manifold, MD;  Location: [facility] ENDOSCOPY;  Service: Gastroenterology;  Laterality: N/A;   IR IMAGING GUIDED PORT INSERTION  04/17/2020   IR THORACENTESIS ASP PLEURAL SPACE W/IMG GUIDE  11/29/2021   IR THORACENTESIS ASP PLEURAL SPACE W/IMG GUIDE  01/03/2022   JOINT  REPLACEMENT Bilateral    REPLACEMENT TOTAL KNEE BILATERAL  2015   TALC PLEURODESIS  09/01/2022   Procedure: Pietro Cassis;  Surgeon: Juanito Doom, MD;  Location: [facility];  Service: Cardiopulmonary;;   TONSILLECTOMY  1960   TRANSTHORACIC ECHOCARDIOGRAM  05/2020  a) 05/2020: EF 55 to 60%.  No R WMA.  Mild LVH.  Indeterminate LVEDP.  Unable to assess RVP.  Normal aortic and mitral valves.  Mildly elevated RAP.; b) 09/2021: EF 50-55%. No RWMA. Mild LVH. ~ LVEDP. Mild LA Dilation. NORMAL RV/RAP.  Normal MV/AoV.    REVIEW OF SYSTEMS:   Review of Systems  Constitutional: No significant baseline fatigue.  Negative for appetite change, chills, fever and unexpected weight change.  HENT: Negative for mouth sores, nosebleeds, sore throat and trouble swallowing.   Eyes: Negative for eye problems and icterus.  Respiratory: Positive for dyspnea. Negative for cough, hemoptysis, and wheezing.   Cardiovascular: Positive for chest discomfort from recent procedure that radiates to his back.  Negative for leg swelling.  Gastrointestinal: Positive for mild constipation.  Negative for abdominal pain, diarrhea, nausea and vomiting.  Genitourinary: Negative for bladder incontinence, difficulty urinating, dysuria, frequency and hematuria.   Musculoskeletal: Positive for chronic back pain.  Negative for gait problem, neck pain and neck stiffness.  Skin: Negative for itching and rash.  Neurological: Positive for occasional dizziness/lightheadedness.  Negative for extremity weakness, gait problem, headaches, and seizures.  Hematological: Negative for adenopathy. Does not bruise/bleed easily.  Psychiatric/Behavioral: Negative for confusion, depression and sleep disturbance. The patient is not nervous/anxious.     PHYSICAL EXAMINATION:  Blood pressure 115/75, pulse 97, temperature 98.5 F (36.9 C), temperature source Oral, resp. rate 17, height 5' 9.5" (1.765 m), weight 296 lb 12.8 oz (134.6 kg), SpO2 96  %.  ECOG PERFORMANCE STATUS: 1  Physical Exam  Constitutional: Oriented to person, place, and time and well-developed, well-nourished, and in no distress.  HENT:  Head: Normocephalic and atraumatic.  Mouth/Throat: Oropharynx is clear and moist. No oropharyngeal exudate.  Eyes: Conjunctivae are normal. Right eye exhibits no discharge. Left eye exhibits no discharge. No scleral icterus.  Neck: Normal range of motion. Neck supple.  Cardiovascular: Normal rate, regular rhythm, normal heart sounds and intact distal pulses.   Pulmonary/Chest: Effort normal.  Decreased breath sounds in the right lung.  No respiratory distress. No wheezes. No rales.  Abdominal: Soft. Bowel sounds are normal. Exhibits no distension and no mass. There is no tenderness.  Musculoskeletal: Normal range of motion. Exhibits no edema.  Lymphadenopathy:    No cervical adenopathy.  Neurological: Alert and oriented to person, place, and time. Exhibits normal muscle tone. Gait normal. Coordination normal.  Skin: Skin is warm and dry. No rash noted. Not diaphoretic. No erythema. No pallor.  Psychiatric: Mood, memory and judgment normal.  Vitals reviewed.    LABORATORY DATA: Lab Results  Component Value Date   WBC 3.1 (L) 09/10/2022   HGB 7.3 (L) 09/10/2022   HCT 23.4 (L) 09/10/2022   MCV 96.7 09/10/2022   PLT 258 09/10/2022      Chemistry      Component Value Date/Time   NA 141 09/10/2022 0825   K 4.1 09/10/2022 0825   CL 107 09/10/2022 0825   CO2 26 09/10/2022 0825   BUN 17 09/10/2022 0825   CREATININE 1.19 09/10/2022 0825   CREATININE 1.45 (H) 10/18/2021 1533      Component Value Date/Time   CALCIUM 7.6 (L) 09/10/2022 0825   ALKPHOS 56 09/10/2022 0825   AST 15 09/10/2022 0825   ALT 13 09/10/2022 0825   BILITOT 0.3 09/10/2022 0825       RADIOGRAPHIC STUDIES:     ASSESSMENT/PLAN:  This is a very pleasant 71 year old Caucasian male diagnosed with extensive stage (  T4, N3, M1C) small cell lung  cancer presented with large right upper lobe lung mass in addition to mediastinal and right supraclavicular lymphadenopathy and multiple metastatic liver lesions diagnosed in April 2021.   The patient initially underwent systemic chemotherapy with carboplatin for an AUC of 5 on day 1, etoposide 100 mg per metered squared on days 1, 2, and 3 in addition to [facility] for myeloprotection.  He also received immunotherapy with Imfinzi on day 1 of every chemotherapy cycle.  He is status post 8 cycles.  Starting from cycle #5 he was on single agent immunotherapy with Imfinzi IV every 4 weeks   The patient had evidence of disease progression on his scan from December 2021.   He was then started on systemic chemotherapy with carboplatin for an AUC of 5 on day 1, etoposide 100 mg per metered squared on days 1, 2, and 3 in addition to Tecentriq 1200 mg on day 1, the patient is status post 8 cycles.  Starting from cycle #5, the patient started maintenance immunotherapy with Tecentriq.  This was discontinued due to evidence of disease progression.  The patient then underwent Zepzelca IV every 3 weeks.  He is status post his 12 cycles and he tolerated it well except for fatigue a few days after treatment.  This was discontinued in February 2023 due to evidence of disease progression.    The patient then underwent treatment with irinotecan 65 mg per metered squared on days 1 and 8 IV every 3 weeks.  The patient is status post 3 cycles and tolerated it fairly well without any concerning adverse side effects.  This was discontinued secondary to disease progression.  Dr. Julien Nordmann saw the patient after this to discuss the options including palliative care and hospice versus single agent Gemzar, or Taxol.  He also discussed repeating his initial treatment with carboplatin and etoposide since he had a good response to treatment in the past.   He was referred to Dr. Mariel Kansky at [facility] for consideration of enrollment in the clinical  trial with tarlatamab.    He also saw Dr. Mindi Junker at [facility] for consideration of enrollment in the ETCTN [id number] trial.  The patient was not interested due to the travel burden.  The patient was interested in restarting systemic chemotherapy with carboplatin for an AUC of 5 on day 1 and etoposide 100 mg per metered squared on days 1, 2, and 3 with Cosela. He is status post 5 cycles and tolerated it well. His dose of carboplatin was reduced to an AUC of 4 and etoposide 80 mg per metered squared.    The plan is to complete 6 cycles of treatment.   Labs were reviewed.  His hemoglobin has continued to downtrend.  His hemoglobin is 7.3 today.  We will arrange for 2 units of blood which will be given tomorrow morning.  Advised the patient that he must keep his blue bracelet on in order to receive his blood tomorrow as scheduled.  Recommend that he proceed with cycle #6 today's scheduled  I will arrange for restaging CT scan the chest, abdomen, pelvis prior to his next appointment.   We reviewed constipation education today.  The patient has been being some pain in his right chest and shoulder after his talc pleurodesis.  I had a lengthy discussion with him and we will send him a short-term prescription of tramadol until he can see pulmonology next week.  He is also currently taking gabapentin which I discussed  may also help with the nerve pain.  He will follow-up with pulmonology next week regarding his pleural effusion.  Once again we are going to arrange for an upcoming repeat CT scan prior to his next appointment.  Given the patient's behavioral changes, headaches, imbalance changes, we will arrange for an MRI of the brain to rule out metastatic disease to the brain.  I have added weekly sample blood bank's to his lab orders in the event that he needs a blood transfusion in the future.  The patient denies any abnormal bleeding.   The patient was advised to call immediately if he has any  concerning symptoms in the interval. The patient voices understanding of current disease status and treatment options and is in agreement with the current care plan. All questions were answered. The patient knows to call the clinic with any problems, questions or concerns. We can certainly see the patient much sooner if necessary           Orders Placed This Encounter  Procedures   CT Chest W Contrast    Standing Status:   Future    Standing Expiration Date:   09/10/2023    Order Specific Question:   If indicated for the ordered procedure, I authorize the administration of contrast media per Radiology protocol    Answer:   Yes    Order Specific Question:   Preferred imaging location?    Answer:   [facility]   CT Abdomen Pelvis W Contrast    Standing Status:   Future    Standing Expiration Date:   09/10/2023    Order Specific Question:   If indicated for the ordered procedure, I authorize the administration of contrast media per Radiology protocol    Answer:   Yes    Order Specific Question:   Preferred imaging location?    Answer:   [facility]    Order Specific Question:   Is Oral Contrast requested for this exam?    Answer:   Yes, Per Radiology protocol   MR Brain W Wo Contrast    Standing Status:   Future    Standing Expiration Date:   09/10/2023    Order Specific Question:   If indicated for the ordered procedure, I authorize the administration of contrast media per Radiology protocol    Answer:   Yes    Order Specific Question:   What is the patient's sedation requirement?    Answer:   No Sedation    Order Specific Question:   Does the patient have a pacemaker or implanted devices?    Answer:   No    Order Specific Question:   Use SRS Protocol?    Answer:   No    Order Specific Question:   Preferred imaging location?    Answer:   [facility] (table limit - 550 lbs)   Informed Consent Details: Physician/Practitioner Attestation; Transcribe to  consent form and obtain patient signature    Standing Status:   Future    Standing Expiration Date:   09/11/2023    Order Specific Question:   Physician/Practitioner attestation of informed consent for blood and or blood product transfusion    Answer:   I, the physician/practitioner, attest that I have discussed with the patient the benefits, risks, side effects, alternatives, likelihood of achieving goals and potential problems during recovery for the procedure that I have provided informed consent.    Order Specific Question:   Product(s)  Answer:   All Product(s)   Care order/instruction    Transfuse Parameters    Standing Status:   Future    Standing Expiration Date:   09/10/2023   Sample to Blood Bank    Standing Status:   Standing    Number of Occurrences:   5    Standing Expiration Date:   09/11/2023   Type and screen         Standing Status:   Future    Standing Expiration Date:   09/11/2023     The total time spent in the appointment was 30-39 minutes.   Jailah Willis L Jessica Checketts, PA-C 09/10/22

## 2022-09-09 NOTE — Assessment & Plan Note (Signed)
Small pneumothorax on the right.  This persist on repeat chest x-ray today.  Patient is clinically stable.  Will place on oxygen 2 L during the daytime.  Close follow-up with chest x-ray next week.  Patient is advised if symptoms develop with chest pain worsening shortness of breath or hypoxemia he is to contact us immediately or go to the emergency room.

## 2022-09-09 NOTE — Progress Notes (Signed)
_0  ID: Adam Wise, male    DOB: 1951/07/13, 71 y.o.   MRN: [id number]  Chief Complaint  Patient presents with   Follow-up    Follow up. Patient says he is getting light headed and still very short of breath after having his procedure done.     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  Completed palliative radiotherapy.  Undergoing active palliative chemotherapy. Complicated by recurrent right-sided pleural effusion status post repeated thoracentesis and talc pleurodesis.  TEST/EVENTS :   09/09/2022 Follow up : COPD, metastatic small cell lung cancer, recurrent right-sided pleural effusion Returns for a posthospital follow-up.  Patient is followed for underlying COPD, metastatic small cell lung cancer undergoing active chemo and recurrent right-sided pleural effusion.  Patient is underwent multiple thoracentesis.  He is also underwent talc pleurodesis for recurrent right-sided effusion.  He completed talc pleurodesis February 2023, May 2023 and most recently September 01, 2022.  Patient says he felt briefly better after his most recent thoracentesis and pleurodesis.  He has short period time where he could take in a deep breath and had decreased dyspnea on exertion.  Patient says since discharge from the hospital he continues to have ongoing significant shortness of breath with minimal activity.  Has some pleuritic pain intermittently.  Chest x-ray prior to discharge showed a small right apical pneumothorax up to 5%, small right pleural effusion with basilar atelectasis and unchanged right supra hilar radiation fibrosis.   Patient has underlying sleep apnea.  He is on CPAP at bedtime with oxygen.    Allergies  Allergen  Reactions   Bupropion     Racing heart   Hydrochlorothiazide Other (See Comments)    Cramping to lower extremities    Immunization History  Administered Date(s) Administered   Fluad Quad(high Dose 65+) 10/26/2020, 08/13/2021   Influenza Inj Mdck Quad Pf 09/18/2017   Influenza,inj,Quad PF,6+ Mos 10/27/2018, 11/08/2019   Influenza-Unspecified 09/18/2017   PFIZER(Purple Top)SARS-COV-2 Vaccination 03/12/2020, 04/02/2020, 12/11/2020   Pneumococcal Conjugate-13 08/17/2015   Pneumococcal Polysaccharide-23 11/25/2012, 10/27/2018   Td 08/27/2016    Past Medical History:  Diagnosis Date   Chickenpox    Chronic knee pain    Chronic low back pain    COPD exacerbation ([place]) 10/11/2021   COPD with exacerbation ([place]) 10/12/2021   Coronary artery calcification seen on CAT scan 11/2021   Coronary CTA 11/27/2021: Moderate to large right pleural effusion and compressive atelectasis right lung base. => Coronary Calcium Score 657.  Diffuse RCA calcification.  Minimal mild disease in the LAD and diagonal branches. == Overall limited study.  Notable artifact.   Essential hypertension    GERD (gastroesophageal reflux disease)    Hyperlipidemia    Malignant neoplasm of upper lobe of right lung ([place]) 04/04/2020   OSA (obstructive sleep apnea)    With nighttime oxygen supplementation   T4, M3, M1 C Metastatic Small Cell Lung Cancer 03/2020   large right upper lobe/right hilar mass with ipsilateral and contralateral mediastinal and right supraclavicular lymphadenopathy in addition to multiple liver lesios. He has disease progression after the first line of chemotherapy in December 2021.   Testosterone deficiency    Type 2 diabetes mellitus (HCC)     Tobacco History: Social History   Tobacco Use  Smoking Status Former  Smokeless Tobacco Never   Counseling given: Not Answered   Outpatient Medications Prior to Visit  Medication Sig Dispense Refill   albuterol (VENTOLIN HFA) 108 (90 Base) MCG/ACT  inhaler Inhale 2 puffs into the lungs every 6 (six) hours as needed for wheezing or shortness of breath. 18 g 0   amLODipine (NORVASC) 10 MG tablet TAKE 1 TABLET BY MOUTH EVERY DAY FOR BLOOD PRESSURE 90 tablet 3   B-D 3CC LUER-LOK SYR 22GX1" 22G X 1" 3 ML MISC USE AS INSTRUCTED FOR TESTOSTERONE INJECTION EVERY 2 WEEKS 10 each 2   bisoprolol (ZEBETA) 5 MG tablet TAKE 1 TABLET (5 MG TOTAL) BY MOUTH DAILY. 90 tablet 2   Black Pepper-Turmeric (TURMERIC COMPLEX/BLACK PEPPER PO) Take 1 tablet by mouth in the morning and at bedtime.     blood glucose meter kit and supplies KIT Dispense based on patient and insurance preference. Use up to four times daily as directed. 1 each 0   Budeson-Glycopyrrol-Formoterol (BREZTRI AEROSPHERE) 160-9-4.8 MCG/ACT AERO Inhale 2 puffs into the lungs in the morning and at bedtime. 10.7 g 3   chlorpheniramine-HYDROcodone (TUSSIONEX) 10-8 MG/5ML SUER Take 5 mLs by mouth at bedtime as needed for cough. 473 mL 0   Coenzyme Q10 (COQ10) 200 MG CAPS Take 200 mg by mouth at bedtime.     ELIQUIS 5 MG TABS tablet TAKE 1 TABLET BY MOUTH TWICE A DAY 180 tablet 2   esomeprazole (NEXIUM) 20 MG capsule Take 20 mg by mouth in the morning.     gabapentin (NEURONTIN) 300 MG capsule TAKE 1 CAPSULE (300 MG TOTAL) BY MOUTH 2 (TWO) TIMES DAILY. FOR BACK PAIN. 180 capsule 2   glipiZIDE (GLUCOTROL) 10 MG tablet TAKE 1 TABLET (10 MG TOTAL) BY MOUTH 2 (TWO) TIMES DAILY BEFORE A MEAL. FOR DIABETES. 180 tablet 0   glucose blood (ONETOUCH ULTRA) test strip USE UP TO 4 TIMES DAILY AS DIRECTED 400 strip 5   Insulin Glargine (BASAGLAR KWIKPEN) 100 UNIT/ML Inject 24 Units into the skin at bedtime. For diabetes. 30 mL 1   Insulin Pen Needle (BD PEN NEEDLE NANO U/F) 32G X 4 MM MISC Use with insulin as prescribed Dx Code: E11.9 200 each 3   ipratropium-albuterol (DUONEB) 0.5-2.5 (3) MG/3ML SOLN Take 3 mLs by nebulization every 4 (four) hours while awake for 3 days, THEN 3 mLs every 4 (four) hours as needed  (shortness of breath or wheezing). 360 mL 0   KRILL OIL PO Take 1 capsule by mouth daily.     Lancets MISC USE UP TO 3 TIMES DAILY AS DIRECTED 100 each 2   losartan (COZAAR) 50 MG tablet TAKE 1 TABLET (50 MG TOTAL) BY MOUTH DAILY. FOR BLOOD PRESSURE. 90 tablet 2   Melatonin 10 MG CAPS Take 10 mg by mouth at bedtime.     metFORMIN (GLUCOPHAGE) 1000 MG tablet TAKE 1 TABLET (1,000 MG TOTAL) BY MOUTH 2 (TWO) TIMES DAILY WITH A MEAL. FOR DIABETES 180 tablet 0   Multiple Vitamin (MULTI-VITAMINS) TABS Take 1 tablet by mouth in the morning.     oxymetazoline (AFRIN) 0.05 % nasal spray Place 1 spray into both nostrils daily as needed for congestion.     pioglitazone (ACTOS) 45 MG tablet TAKE 1 TABLET (45 MG TOTAL) BY MOUTH DAILY. FOR DIABETES. 90 tablet 0   pravastatin (PRAVACHOL) 40 MG tablet TAKE 1 TABLET BY MOUTH EVERY DAY IN THE EVENING FOR CHOLESTEROL 90 tablet 2   testosterone cypionate (DEPOTESTOSTERONE CYPIONATE) 200 MG/ML injection  Inject 200 mg into the muscle every 14 (fourteen) days.     No facility-administered medications prior to visit.     Review of Systems:   Constitutional:   No  weight loss, night sweats,  Fevers, chills, + fatigue, or  lassitude.  HEENT:   No headaches,  Difficulty swallowing,  Tooth/dental problems, or  Sore throat,                No sneezing, itching, ear ache, nasal congestion, post nasal drip,   CV:  No chest pain,  Orthopnea, PND, swelling in lower extremities, anasarca, dizziness, palpitations, syncope.   GI  No heartburn, indigestion, abdominal pain, nausea, vomiting, diarrhea, change in bowel habits, loss of appetite, bloody stools.   Resp:  No chest wall deformity  Skin: no rash or lesions.  GU: no dysuria, change in color of urine, no urgency or frequency.  No flank pain, no hematuria   MS:  No joint pain or swelling.  No decreased range of motion.  No back pain.    Physical Exam  BP 128/66 (BP Location: Left Arm, Patient Position:  Sitting, Cuff Size: Normal)   Pulse (!) 102   Temp 98.4 F (36.9 C) (Oral)   Ht 5' 9.5" (1.765 m)   Wt 295 lb 9.6 oz (134.1 kg)   SpO2 95%   BMI 43.03 kg/m   GEN: A/Ox3; pleasant , NAD, well nourished , chronically ill-appearing, walker   HEENT:  [place]/AT, NOSE-clear, THROAT-clear, no lesions, no postnasal drip or exudate noted.   NECK:  Supple w/ fair ROM; no JVD; normal carotid impulses w/o bruits; no thyromegaly or nodules palpated; no lymphadenopathy.    RESP decreased bs in bases no accessory muscle use, no dullness to percussion  CARD:  RRR, no m/r/g, tr  peripheral edema, pulses intact, no cyanosis or clubbing.  GI:   Soft & nt; nml bowel sounds; no organomegaly or masses detected.   Musco: Warm bil, no deformities or joint swelling noted.   Neuro: alert, no focal deficits noted.    Skin: Warm, no lesions or rashes    Lab Results:  CBC    Component Value Date/Time   WBC 4.1 09/03/2022 1314   WBC 2.5 (L) 10/18/2021 1533   RBC 2.73 (L) 09/03/2022 1314   HGB 8.3 (L) 09/03/2022 1314   HCT 26.2 (L) 09/03/2022 1314   HCT 42.7 10/06/2017 1052   PLT 78 (L) 09/03/2022 1314   MCV 96.0 09/03/2022 1314   MCH 30.4 09/03/2022 1314   MCHC 31.7 09/03/2022 1314   RDW 19.4 (H) 09/03/2022 1314   LYMPHSABS 0.5 (L) 09/03/2022 1314   MONOABS 0.8 09/03/2022 1314   EOSABS 0.0 09/03/2022 1314   BASOSABS 0.0 09/03/2022 1314    BMET    Component Value Date/Time   NA 137 09/03/2022 1314   K 4.1 09/03/2022 1314   CL 101 09/03/2022 1314   CO2 23 09/03/2022 1314   GLUCOSE 157 (H) 09/03/2022 1314   BUN 30 (H) 09/03/2022 1314   CREATININE 1.78 (H) 09/03/2022 1314   CREATININE 1.45 (H) 10/18/2021 1533   CALCIUM 7.6 (L) 09/03/2022 1314   GFRNONAA 40 (L) 09/03/2022 1314   GFRAA >60 09/11/2020 1118    BNP    Component Value Date/Time   BNP 104.8 (H) 10/11/2021 1047    ProBNP    Component Value Date/Time   PROBNP 41.0 03/26/2020 0936    Imaging:   CARBOplatin  (PARAPLATIN) 680 mg in sodium chloride 0.9 %  250 mL chemo infusion     Date Action Dose Route User   Discharged on 09/02/2022   Admitted on 09/01/2022   07/30/2022 1515 Rate/Dose Change (none) Intravenous Ishmael Holter, RN   07/30/2022 1515 Rate/Dose Change (none) Intravenous Ishmael Holter, RN   07/30/2022 1442 Infusion Verify (none) Intravenous Ishmael Holter, RN   07/30/2022 1442 New Bag/Given 680 mg Intravenous Georgette Dover M, RN      CARBOplatin (PARAPLATIN) 600 mg in sodium chloride 0.9 % 250 mL chemo infusion     Date Action Dose Route User   Discharged on 09/02/2022   Admitted on 09/01/2022   08/20/2022 1659 Infusion Verify (none) Intravenous Rafael Bihari, RN   08/20/2022 1659 Rate/Dose Change (none) Intravenous Rafael Bihari, RN   08/20/2022 1658 New Bag/Given 600 mg Intravenous Belva Chimes, RN      cetirizine Ambrose Mantle) injection 10 mg     Date Action Dose Route User   Discharged on 09/02/2022   Admitted on 09/01/2022   07/30/2022 1354 Given 10 mg Intravenous Ishmael Holter, RN      dexamethasone (DECADRON) 10 mg in sodium chloride 0.9 % 50 mL IVPB     Date Action Dose Route User   Discharged on 09/02/2022   Admitted on 09/01/2022   07/30/2022 1325 Rate/Dose Change (none) Intravenous Ishmael Holter, RN   07/30/2022 1325 New Bag/Given 10 mg Intravenous Georgette Dover M, RN      dexamethasone (DECADRON) 10 mg in sodium chloride 0.9 % 50 mL IVPB     Date Action Dose Route User   Discharged on 09/02/2022   Admitted on 09/01/2022   07/31/2022 1322 Rate/Dose Change (none) Intravenous Person, Blaine Hamper, RN   07/31/2022 1322 New Bag/Given 10 mg Intravenous Person, Brooke A, RN      dexamethasone (DECADRON) 10 mg in sodium chloride 0.9 % 50 mL IVPB     Date Action Dose Route User   Discharged on 09/02/2022   Admitted on 09/01/2022   08/01/2022 1447 Rate/Dose Change (none) Intravenous Person, Blaine Hamper, RN   08/01/2022 1442 Rate/Dose Change (none) Intravenous  Person, Blaine Hamper, RN   08/01/2022 1426 Rate/Dose Change (none) Intravenous Person, Blaine Hamper, RN   08/01/2022 1426 New Bag/Given 10 mg Intravenous Person, Brooke A, RN      dexamethasone (DECADRON) 10 mg in sodium chloride 0.9 % 50 mL IVPB     Date Action Dose Route User   Discharged on 09/02/2022   Admitted on 09/01/2022   08/20/2022 1533 Rate/Dose Change (none) Intravenous Rafael Bihari, RN   08/20/2022 1532 New Bag/Given 10 mg Intravenous Belva Chimes, RN      dexamethasone (DECADRON) 10 mg in sodium chloride 0.9 % 50 mL IVPB     Date Action Dose Route User   Discharged on 09/02/2022   Admitted on 09/01/2022   08/21/2022 1408 Rate/Dose Change (none) Intravenous Belva Chimes, RN   08/21/2022 1407 New Bag/Given 10 mg Intravenous Belva Chimes, RN      dexamethasone (DECADRON) 10 mg in sodium chloride 0.9 % 50 mL IVPB     Date Action Dose Route User   Discharged on 09/02/2022   Admitted on 09/01/2022   08/22/2022 1217 Rate/Dose Change (none) Intravenous Gillian Shields, RN   08/22/2022 1217 New Bag/Given 10 mg Intravenous Marquis Lunch M, RN      etoposide (VEPESID) 260 mg in sodium chloride 0.9 % 1,000 mL chemo infusion  Date Action Dose Route User   Discharged on 09/02/2022   Admitted on 09/01/2022   07/30/2022 1520 New Bag/Given 260 mg Intravenous Georgette Dover M, RN      etoposide (VEPESID) 260 mg in sodium chloride 0.9 % 1,000 mL chemo infusion     Date Action Dose Route User   Discharged on 09/02/2022   Admitted on 09/01/2022   07/31/2022 1417 New Bag/Given 260 mg Intravenous Person, Brooke A, RN      etoposide (VEPESID) 260 mg in sodium chloride 0.9 % 1,000 mL chemo infusion     Date Action Dose Route User   Discharged on 09/02/2022   Admitted on 09/01/2022   08/01/2022 1523 New Bag/Given 260 mg Intravenous Person, Brooke A, RN      etoposide (VEPESID) 210 mg in sodium chloride 0.9 % 1,000 mL chemo infusion     Date Action Dose Route User   Discharged on 09/02/2022    Admitted on 09/01/2022   08/20/2022 1734 New Bag/Given 210 mg Intravenous Rafael Bihari, RN      etoposide (VEPESID) 210 mg in sodium chloride 0.9 % 1,000 mL chemo infusion     Date Action Dose Route User   Discharged on 09/02/2022   Admitted on 09/01/2022   08/21/2022 1455 New Bag/Given 210 mg Intravenous Belva Chimes, RN      etoposide (VEPESID) 210 mg in sodium chloride 0.9 % 1,000 mL chemo infusion     Date Action Dose Route User   Discharged on 09/02/2022   Admitted on 09/01/2022   08/22/2022 1316 New Bag/Given 210 mg Intravenous Gillian Shields, RN      famotidine (PEPCID) IVPB 20 mg premix     Date Action Dose Route User   Discharged on 09/02/2022   Admitted on 09/01/2022   07/30/2022 1306 Rate/Dose Change (none) Intravenous Ishmael Holter, RN   07/30/2022 1306 New Bag/Given 20 mg Intravenous Derrick Ravel, RN      famotidine (PEPCID) IVPB 20 mg premix     Date Action Dose Route User   Discharged on 09/02/2022   Admitted on 09/01/2022   08/20/2022 1600 Rate/Dose Change (none) Intravenous Rafael Bihari, RN   08/20/2022 1600 New Bag/Given 20 mg Intravenous Belva Chimes, RN      fosaprepitant (EMEND) 150 mg in sodium chloride 0.9 % 145 mL IVPB     Date Action Dose Route User   Discharged on 09/02/2022   Admitted on 09/01/2022   07/30/2022 1348 Rate/Dose Change (none) Intravenous Ishmael Holter, RN   07/30/2022 1328 Rate/Dose Change (none) Intravenous Ishmael Holter, RN   07/30/2022 1328 New Bag/Given 150 mg Intravenous Mosca, Bridget M, RN      fosaprepitant (EMEND) 150 mg in sodium chloride 0.9 % 145 mL IVPB     Date Action Dose Route User   Discharged on 09/02/2022   Admitted on 09/01/2022   08/20/2022 1535 Infusion Verify (none) Intravenous Rafael Bihari, RN   08/20/2022 1535 Rate/Dose Change (none) Intravenous Rafael Bihari, RN   08/20/2022 1534 New Bag/Given 150 mg Intravenous Belva Chimes, RN      heparin lock flush 100 unit/mL     Date  Action Dose Route User   Discharged on 09/02/2022   Admitted on 09/01/2022   07/30/2022 1633 Given 500 Units Intracatheter Ishmael Holter, RN      heparin lock flush 100 unit/mL     Date Action Dose Route User   Discharged on  09/02/2022   Admitted on 09/01/2022   07/31/2022 1527 Given 500 Units Intracatheter Person, Brooke A, RN      heparin lock flush 100 unit/mL     Date Action Dose Route User   Discharged on 09/02/2022   Admitted on 09/01/2022   08/01/2022 1631 Given 500 Units Intracatheter Person, Brooke A, RN      heparin lock flush 100 unit/mL     Date Action Dose Route User   Discharged on 09/02/2022   Admitted on 09/01/2022   08/21/2022 1608 Given 500 Units Intracatheter Charleston Poot, RN      heparin lock flush 100 unit/mL     Date Action Dose Route User   Discharged on 09/02/2022   Admitted on 09/01/2022   08/22/2022 1422 Given 500 Units Intracatheter Gillian Shields, RN      palonosetron (ALOXI) injection 0.25 mg     Date Action Dose Route User   Discharged on 09/02/2022   Admitted on 09/01/2022   07/30/2022 1302 Given 0.25 mg Intravenous Derrick Ravel, RN      palonosetron (ALOXI) injection 0.25 mg     Date Action Dose Route User   Discharged on 09/02/2022   Admitted on 09/01/2022   08/20/2022 1515 Given 0.25 mg Intravenous Belva Chimes, RN      0.9 %  sodium chloride infusion     Date Action Dose Route User   Discharged on 09/02/2022   Admitted on 09/01/2022   07/30/2022 1631 Rate/Dose Change (none) Intravenous Ishmael Holter, RN   07/30/2022 1629 Rate/Dose Change (none) Intravenous Ishmael Holter, RN   07/30/2022 1552 Infusion Verify (none) Intravenous Ishmael Holter, RN   07/30/2022 1552 Infusion Verify (none) Intravenous Ishmael Holter, RN   07/30/2022 1520 Infusion Verify (none) Intravenous Ishmael Holter, RN      0.9 %  sodium chloride infusion     Date Action Dose Route User   Discharged on 09/02/2022   Admitted on 09/01/2022    07/31/2022 1526 Rate/Dose Change (none) Intravenous Person, Blaine Hamper, RN   07/31/2022 1524 Rate/Dose Change (none) Intravenous Person, Blaine Hamper, RN   07/31/2022 1418 Infusion Verify (none) Intravenous Person, Blaine Hamper, RN   07/31/2022 1418 Rate/Dose Change (none) Intravenous Person, Blaine Hamper, RN   07/31/2022 1414 Rate/Dose Change (none) Intravenous Person, Brooke A, RN      0.9 %  sodium chloride infusion     Date Action Dose Route User   Discharged on 09/02/2022   Admitted on 09/01/2022   08/01/2022 1632 Rate/Dose Change (none) Intravenous Person, Blaine Hamper, RN   08/01/2022 1630 Rate/Dose Change (none) Intravenous Person, Blaine Hamper, RN   08/01/2022 1520 Infusion Verify (none) Intravenous Person, Blaine Hamper, RN   08/01/2022 1520 Rate/Dose Change (none) Intravenous Person, Blaine Hamper, RN   08/01/2022 1517 Rate/Dose Change (none) Intravenous Person, Brooke A, RN      0.9 %  sodium chloride infusion     Date Action Dose Route User   Discharged on 09/02/2022   Admitted on 09/01/2022   08/20/2022 1844 Infusion Verify (none) Intravenous Rafael Bihari, RN   08/20/2022 1738 Rate/Dose Change (none) Intravenous Rafael Bihari, RN   08/20/2022 1736 Rate/Dose Change (none) Intravenous Rafael Bihari, RN   08/20/2022 1736 Infusion Verify (none) Intravenous Rafael Bihari, RN   08/20/2022 1736 Rate/Dose Change (none) Intravenous Rafael Bihari, RN      0.9 %  sodium chloride infusion     Date  Action Dose Route User   Discharged on 09/02/2022   Admitted on 09/01/2022   08/21/2022 1607 Rate/Dose Change (none) Intravenous Belva Chimes, RN   08/21/2022 1604 Rate/Dose Change (none) Intravenous Belva Chimes, RN   08/21/2022 1457 Infusion Verify (none) Intravenous Belva Chimes, RN   08/21/2022 1457 Rate/Dose Change (none) Intravenous Belva Chimes, RN   08/21/2022 1453 Rate/Dose Change (none) Intravenous Belva Chimes, RN      0.9 %  sodium chloride infusion     Date Action Dose Route User   Discharged on  09/02/2022   Admitted on 09/01/2022   08/22/2022 1429 Rate/Dose Change (none) Intravenous Gillian Shields, RN   08/22/2022 1426 Rate/Dose Change (none) Intravenous Gillian Shields, RN   08/22/2022 1320 Infusion Verify (none) Intravenous Gillian Shields, RN   08/22/2022 1314 Infusion Verify (none) Intravenous Gillian Shields, RN   08/22/2022 1314 Rate/Dose Change (none) Intravenous Marquis Lunch M, RN      sodium chloride flush (NS) 0.9 % injection 10 mL     Date Action Dose Route User   Discharged on 09/02/2022   Admitted on 09/01/2022   07/30/2022 1120 Given 10 mL Intracatheter Tama Headings B, LPN      sodium chloride flush (NS) 0.9 % injection 10 mL     Date Action Dose Route User   Discharged on 09/02/2022   Admitted on 09/01/2022   07/30/2022 1633 Given 10 mL Intracatheter Mosca, Bridget M, RN      sodium chloride flush (NS) 0.9 % injection 10 mL     Date Action Dose Route User   Discharged on 09/02/2022   Admitted on 09/01/2022   07/31/2022 1527 Given 10 mL Intracatheter Person, Brooke A, RN      sodium chloride flush (NS) 0.9 % injection 10 mL     Date Action Dose Route User   Discharged on 09/02/2022   Admitted on 09/01/2022   08/01/2022 1631 Given 10 mL Intracatheter Person, Brooke A, RN      sodium chloride flush (NS) 0.9 % injection 10 mL     Date Action Dose Route User   Discharged on 09/02/2022   Admitted on 09/01/2022   08/20/2022 1308 Given 10 mL Intracatheter Marlowe Aschoff B, RN      sodium chloride flush (NS) 0.9 % injection 10 mL     Date Action Dose Route User   Discharged on 09/02/2022   Admitted on 09/01/2022   08/20/2022 1845 Given 10 mL Intracatheter Rafael Bihari, RN      sodium chloride flush (NS) 0.9 % injection 10 mL     Date Action Dose Route User   Discharged on 09/02/2022   Admitted on 09/01/2022   08/21/2022 1608 Given 10 mL Intracatheter Charleston Poot, RN      sodium chloride flush (NS) 0.9 % injection 10 mL     Date Action Dose Route User    Discharged on 09/02/2022   Admitted on 09/01/2022   08/22/2022 1422 Given 10 mL Intracatheter Gillian Shields, RN      trilaciclib dihydrochloride (COSELA) 600 mg in dextrose 5 % 250 mL (2.069 mg/mL) infusion     Date Action Dose Route User   Discharged on 09/02/2022   Admitted on 09/01/2022   07/30/2022 1404 Infusion Verify (none) Intravenous Ishmael Holter, RN   07/30/2022 1404 Rate/Dose Change (none) Intravenous Ishmael Holter, RN   07/30/2022 1404 New Bag/Given 600 mg Intravenous Ishmael Holter,  RN      trilaciclib dihydrochloride (COSELA) 600 mg in dextrose 5 % 250 mL (2.069 mg/mL) infusion     Date Action Dose Route User   Discharged on 09/02/2022   Admitted on 09/01/2022   07/31/2022 1345 Infusion Verify (none) Intravenous Person, Blaine Hamper, RN   07/31/2022 1345 Rate/Dose Change (none) Intravenous Person, Blaine Hamper, RN   07/31/2022 1345 New Bag/Given 600 mg Intravenous Person, Brooke A, RN      trilaciclib dihydrochloride (COSELA) 600 mg in dextrose 5 % 250 mL (2.069 mg/mL) infusion     Date Action Dose Route User   Discharged on 09/02/2022   Admitted on 09/01/2022   08/01/2022 1447 Rate/Dose Change (none) Intravenous Person, Blaine Hamper, RN   08/01/2022 1446 New Bag/Given 600 mg Intravenous Person, Brooke A, RN      trilaciclib dihydrochloride (COSELA) 600 mg in dextrose 5 % 250 mL (2.069 mg/mL) infusion     Date Action Dose Route User   Discharged on 09/02/2022   Admitted on 09/01/2022   08/20/2022 1626 Infusion Verify (none) Intravenous Rafael Bihari, RN   08/20/2022 1626 Rate/Dose Change (none) Intravenous Rafael Bihari, RN   08/20/2022 1625 New Bag/Given 600 mg Intravenous Belva Chimes, RN      trilaciclib dihydrochloride (COSELA) 600 mg in dextrose 5 % 250 mL (2.069 mg/mL) infusion     Date Action Dose Route User   Discharged on 09/02/2022   Admitted on 09/01/2022   08/21/2022 1424 Infusion Verify (none) Intravenous Belva Chimes, RN   08/21/2022 1424 Rate/Dose Change  (none) Intravenous Belva Chimes, RN   08/21/2022 1424 New Bag/Given 600 mg Intravenous Belva Chimes, RN      trilaciclib dihydrochloride (COSELA) 600 mg in dextrose 5 % 250 mL (2.069 mg/mL) infusion     Date Action Dose Route User   Discharged on 09/02/2022   Admitted on 09/01/2022   08/22/2022 1237 Infusion Verify (none) Intravenous Gillian Shields, RN   08/22/2022 1236 New Bag/Given 600 mg Intravenous Marquis Lunch M, RN           No data to display          No results found for: "NITRICOXIDE"      Assessment & Plan:   Recurrent right pleural effusion Recurrent right pleural effusion status post multiple thoracentesis and talc pleurodesis via chest tube.  Chest x-ray today shows persistent small right pleural effusion and a small right apical pneumothorax. For now he is clinically stable.  Hold on additional thoracentesis at this time. We will consider repeat therapeutic thoracentesis as indicated  Plan Patient Instructions  Begin Oxygen 2l/m.  Continue on Breztri 2 puffs Twice daily  ,rinse after use.  Albuterol inhaler As needed   Activity as tolerated.  Follow up in 1 week with Dr. Valeta Harms or Maedell Hedger NP with Chest xray  Please contact office for sooner follow up if symptoms do not improve or worsen or seek emergency care       Pneumothorax on right Small pneumothorax on the right.  This persist on repeat chest x-ray today.  Patient is clinically stable.  Will place on oxygen 2 L during the daytime.  Close follow-up with chest x-ray next week.  Patient is advised if symptoms develop with chest pain worsening shortness of breath or hypoxemia he is to contact us immediately or go to the emergency room.  OSA (obstructive sleep apnea) Continue on CPAP at bedtime with oxygen  Small cell lung  cancer, right upper lobe [facility]) Continue follow-up with oncology.  Plans to restart chemotherapy later this week.    I spent   45 minutes dedicated to the care of this patient on  the date of this encounter to include pre-visit review of records, face-to-face time with the patient discussing conditions above, post visit ordering of testing, clinical documentation with the electronic health record, making appropriate referrals as documented, and communicating necessary findings to members of the patients care team.   Rexene Edison, NP 09/09/2022

## 2022-09-10 ENCOUNTER — Inpatient Hospital Stay (HOSPITAL_BASED_OUTPATIENT_CLINIC_OR_DEPARTMENT_OTHER): Payer: Medicare Other | Admitting: Physician Assistant

## 2022-09-10 ENCOUNTER — Inpatient Hospital Stay: Payer: Medicare Other

## 2022-09-10 ENCOUNTER — Other Ambulatory Visit: Payer: Self-pay | Admitting: Physician Assistant

## 2022-09-10 VITALS — BP 115/75 | HR 97 | Temp 98.5°F | Resp 17 | Ht 69.5 in | Wt 296.8 lb

## 2022-09-10 DIAGNOSIS — C787 Secondary malignant neoplasm of liver and intrahepatic bile duct: Secondary | ICD-10-CM | POA: Diagnosis not present

## 2022-09-10 DIAGNOSIS — D649 Anemia, unspecified: Secondary | ICD-10-CM

## 2022-09-10 DIAGNOSIS — C3411 Malignant neoplasm of upper lobe, right bronchus or lung: Secondary | ICD-10-CM | POA: Diagnosis not present

## 2022-09-10 DIAGNOSIS — Z5111 Encounter for antineoplastic chemotherapy: Secondary | ICD-10-CM | POA: Diagnosis not present

## 2022-09-10 DIAGNOSIS — G893 Neoplasm related pain (acute) (chronic): Secondary | ICD-10-CM

## 2022-09-10 DIAGNOSIS — R4689 Other symptoms and signs involving appearance and behavior: Secondary | ICD-10-CM | POA: Diagnosis not present

## 2022-09-10 DIAGNOSIS — K5900 Constipation, unspecified: Secondary | ICD-10-CM | POA: Diagnosis not present

## 2022-09-10 DIAGNOSIS — J90 Pleural effusion, not elsewhere classified: Secondary | ICD-10-CM | POA: Diagnosis not present

## 2022-09-10 LAB — CBC WITH DIFFERENTIAL (CANCER CENTER ONLY)
Abs Immature Granulocytes: 0.07 10*3/uL (ref 0.00–0.07)
Basophils Absolute: 0 10*3/uL (ref 0.0–0.1)
Basophils Relative: 1 %
Eosinophils Absolute: 0.1 10*3/uL (ref 0.0–0.5)
Eosinophils Relative: 2 %
HCT: 23.4 % — ABNORMAL LOW (ref 39.0–52.0)
Hemoglobin: 7.3 g/dL — ABNORMAL LOW (ref 13.0–17.0)
Immature Granulocytes: 2 %
Lymphocytes Relative: 12 %
Lymphs Abs: 0.4 10*3/uL — ABNORMAL LOW (ref 0.7–4.0)
MCH: 30.2 pg (ref 26.0–34.0)
MCHC: 31.2 g/dL (ref 30.0–36.0)
MCV: 96.7 fL (ref 80.0–100.0)
Monocytes Absolute: 0.5 10*3/uL (ref 0.1–1.0)
Monocytes Relative: 17 %
Neutro Abs: 2 10*3/uL (ref 1.7–7.7)
Neutrophils Relative %: 66 %
Platelet Count: 258 10*3/uL (ref 150–400)
RBC: 2.42 MIL/uL — ABNORMAL LOW (ref 4.22–5.81)
RDW: 18.3 % — ABNORMAL HIGH (ref 11.5–15.5)
WBC Count: 3.1 10*3/uL — ABNORMAL LOW (ref 4.0–10.5)
nRBC: 0.7 % — ABNORMAL HIGH (ref 0.0–0.2)

## 2022-09-10 LAB — CMP (CANCER CENTER ONLY)
ALT: 13 U/L (ref 0–44)
AST: 15 U/L (ref 15–41)
Albumin: 3.6 g/dL (ref 3.5–5.0)
Alkaline Phosphatase: 56 U/L (ref 38–126)
Anion gap: 8 (ref 5–15)
BUN: 17 mg/dL (ref 8–23)
CO2: 26 mmol/L (ref 22–32)
Calcium: 7.6 mg/dL — ABNORMAL LOW (ref 8.9–10.3)
Chloride: 107 mmol/L (ref 98–111)
Creatinine: 1.19 mg/dL (ref 0.61–1.24)
GFR, Estimated: >60 mL/min (ref >60)
Glucose, Bld: 152 mg/dL — ABNORMAL HIGH (ref 70–99)
Potassium: 4.1 mmol/L (ref 3.5–5.1)
Sodium: 141 mmol/L (ref 135–145)
Total Bilirubin: 0.3 mg/dL (ref 0.3–1.2)
Total Protein: 6.5 g/dL (ref 6.5–8.1)

## 2022-09-10 LAB — ABO/RH: ABO/RH(D): B POS

## 2022-09-10 LAB — PREPARE RBC (CROSSMATCH)

## 2022-09-10 MED ORDER — SODIUM CHLORIDE 0.9% FLUSH
10.0000 mL | INTRAVENOUS | Status: DC | PRN
  Administered 2022-09-10: 10 mL

## 2022-09-10 MED ORDER — SODIUM CHLORIDE 0.9 % IV SOLN
150.0000 mg | Freq: Once | INTRAVENOUS | Status: AC
  Administered 2022-09-10: 150 mg via INTRAVENOUS
  Filled 2022-09-10: qty 150

## 2022-09-10 MED ORDER — SODIUM CHLORIDE 0.9 % IV SOLN
80.0000 mg/m2 | Freq: Once | INTRAVENOUS | Status: AC
  Administered 2022-09-10: 210 mg via INTRAVENOUS
  Filled 2022-09-10: qty 10.5

## 2022-09-10 MED ORDER — SODIUM CHLORIDE 0.9 % IV SOLN: Freq: Once | INTRAVENOUS | Status: AC

## 2022-09-10 MED ORDER — TRILACICLIB DIHYDROCHLORIDE INJECTION 300 MG
240.0000 mg/m2 | Freq: Once | INTRAVENOUS | Status: AC
  Administered 2022-09-10: 600 mg via INTRAVENOUS
  Filled 2022-09-10: qty 40

## 2022-09-10 MED ORDER — HEPARIN SOD (PORK) LOCK FLUSH 100 UNIT/ML IV SOLN
500.0000 [IU] | Freq: Once | INTRAVENOUS | Status: AC | PRN
  Administered 2022-09-10: 500 [IU]

## 2022-09-10 MED ORDER — TRAMADOL HCL 50 MG PO TABS: 50.0000 mg | ORAL_TABLET | Freq: Four times a day (QID) | ORAL | 0 refills | Status: DC | PRN

## 2022-09-10 MED ORDER — PALONOSETRON HCL INJECTION 0.25 MG/5ML
0.2500 mg | Freq: Once | INTRAVENOUS | Status: AC
  Administered 2022-09-10: 0.25 mg via INTRAVENOUS
  Filled 2022-09-10: qty 5

## 2022-09-10 MED ORDER — SODIUM CHLORIDE 0.9 % IV SOLN
530.0000 mg | Freq: Once | INTRAVENOUS | Status: AC
  Administered 2022-09-10: 530 mg via INTRAVENOUS
  Filled 2022-09-10: qty 53

## 2022-09-10 MED ORDER — SODIUM CHLORIDE 0.9 % IV SOLN
10.0000 mg | Freq: Once | INTRAVENOUS | Status: AC
  Administered 2022-09-10: 10 mg via INTRAVENOUS
  Filled 2022-09-10: qty 10

## 2022-09-10 MED FILL — Dexamethasone Sodium Phosphate Inj 100 MG/10ML: INTRAMUSCULAR | Qty: 1 | Status: AC

## 2022-09-10 NOTE — Patient Instructions (Signed)
[facility]  Discharge Instructions: Thank you for choosing [facility] to provide your oncology and hematology care.   If you have a lab appointment with the [facility], please go directly to the [facility] and check in at the registration area.   Wear comfortable clothing and clothing appropriate for easy access to any Portacath or PICC line.   We strive to give you quality time with your provider. You may need to reschedule your appointment if you arrive late (15 or more minutes).  Arriving late affects you and other patients whose appointments are after yours.  Also, if you miss three or more appointments without notifying the office, you may be dismissed from the clinic at the provider's discretion.      For prescription refill requests, have your pharmacy contact our office and allow 72 hours for refills to be completed.    Today you received the following chemotherapy and/or immunotherapy agents : Cosela, Carboplatin & Etoposide.   To help prevent nausea and vomiting after your treatment, we encourage you to take your nausea medication as directed.  BELOW ARE SYMPTOMS THAT SHOULD BE REPORTED IMMEDIATELY: *FEVER GREATER THAN 100.4 F (38 C) OR HIGHER *CHILLS OR SWEATING *NAUSEA AND VOMITING THAT IS NOT CONTROLLED WITH YOUR NAUSEA MEDICATION *UNUSUAL SHORTNESS OF BREATH *UNUSUAL BRUISING OR BLEEDING *URINARY PROBLEMS (pain or burning when urinating, or frequent urination) *BOWEL PROBLEMS (unusual diarrhea, constipation, pain near the anus) TENDERNESS IN MOUTH AND THROAT WITH OR WITHOUT PRESENCE OF ULCERS (sore throat, sores in mouth, or a toothache) UNUSUAL RASH, SWELLING OR PAIN  UNUSUAL VAGINAL DISCHARGE OR ITCHING   Items with * indicate a potential emergency and should be followed up as soon as possible or go to the Emergency Department if any problems should occur.  Please show the CHEMOTHERAPY ALERT CARD or IMMUNOTHERAPY ALERT  CARD at check-in to the Emergency Department and triage nurse.  Should you have questions after your visit or need to cancel or reschedule your appointment, please contact [facility]  Dept: [phone]  and follow the prompts.  Office hours are 8:00 a.m. to 4:30 p.m. Monday - Friday. Please note that voicemails left after 4:00 p.m. may not be returned until the following business day.  We are closed weekends and major holidays. You have access to a nurse at all times for urgent questions. Please call the main number to the clinic Dept: [phone] and follow the prompts.   For any non-urgent questions, you may also contact your provider using MyChart. We now offer e-Visits for anyone 37 and older to request care online for non-urgent symptoms. For details visit [email or web address].   Also download the MyChart app! Go to the app store, search "MyChart", open the app, select [facility], and log in with your MyChart username and password.  Masks are optional in the cancer centers. If you would like for your care team to wear a mask while they are taking care of you, please let them know. For doctor visits, patients may have with them one support Corrinna Karapetyan who is at least 72 years old. At this time, visitors are not allowed in the infusion area.

## 2022-09-11 ENCOUNTER — Encounter: Payer: Self-pay | Admitting: Internal Medicine

## 2022-09-11 ENCOUNTER — Inpatient Hospital Stay: Payer: Medicare Other

## 2022-09-11 ENCOUNTER — Other Ambulatory Visit: Payer: Self-pay

## 2022-09-11 VITALS — BP 121/67 | HR 92 | Temp 97.9°F | Resp 20

## 2022-09-11 DIAGNOSIS — C3411 Malignant neoplasm of upper lobe, right bronchus or lung: Secondary | ICD-10-CM | POA: Diagnosis not present

## 2022-09-11 DIAGNOSIS — Z5111 Encounter for antineoplastic chemotherapy: Secondary | ICD-10-CM | POA: Diagnosis not present

## 2022-09-11 DIAGNOSIS — D649 Anemia, unspecified: Secondary | ICD-10-CM | POA: Diagnosis not present

## 2022-09-11 DIAGNOSIS — C787 Secondary malignant neoplasm of liver and intrahepatic bile duct: Secondary | ICD-10-CM | POA: Diagnosis not present

## 2022-09-11 DIAGNOSIS — J90 Pleural effusion, not elsewhere classified: Secondary | ICD-10-CM | POA: Diagnosis not present

## 2022-09-11 DIAGNOSIS — K5900 Constipation, unspecified: Secondary | ICD-10-CM | POA: Diagnosis not present

## 2022-09-11 MED ORDER — SODIUM CHLORIDE 0.9 % IV SOLN
80.0000 mg/m2 | Freq: Once | INTRAVENOUS | Status: AC
  Administered 2022-09-11: 210 mg via INTRAVENOUS
  Filled 2022-09-11: qty 10.5

## 2022-09-11 MED ORDER — TRILACICLIB DIHYDROCHLORIDE INJECTION 300 MG
240.0000 mg/m2 | Freq: Once | INTRAVENOUS | Status: AC
  Administered 2022-09-11: 600 mg via INTRAVENOUS
  Filled 2022-09-11: qty 40

## 2022-09-11 MED ORDER — SODIUM CHLORIDE 0.9% FLUSH
10.0000 mL | INTRAVENOUS | Status: DC | PRN
  Administered 2022-09-11: 10 mL

## 2022-09-11 MED ORDER — HEPARIN SOD (PORK) LOCK FLUSH 100 UNIT/ML IV SOLN
500.0000 [IU] | Freq: Once | INTRAVENOUS | Status: AC | PRN
  Administered 2022-09-11: 500 [IU]

## 2022-09-11 MED ORDER — DIPHENHYDRAMINE HCL 25 MG PO CAPS: 25.0000 mg | ORAL_CAPSULE | Freq: Once | ORAL | Status: DC

## 2022-09-11 MED ORDER — ACETAMINOPHEN 325 MG PO TABS
650.0000 mg | ORAL_TABLET | Freq: Once | ORAL | Status: AC
  Administered 2022-09-11: 650 mg via ORAL
  Filled 2022-09-11: qty 2

## 2022-09-11 MED ORDER — SODIUM CHLORIDE 0.9% IV SOLUTION
250.0000 mL | Freq: Once | INTRAVENOUS | Status: AC
  Administered 2022-09-11: 250 mL via INTRAVENOUS

## 2022-09-11 MED ORDER — SODIUM CHLORIDE 0.9 % IV SOLN
10.0000 mg | Freq: Once | INTRAVENOUS | Status: AC
  Administered 2022-09-11: 10 mg via INTRAVENOUS
  Filled 2022-09-11: qty 10

## 2022-09-11 MED ORDER — SODIUM CHLORIDE 0.9 % IV SOLN: Freq: Once | INTRAVENOUS | Status: AC

## 2022-09-11 MED ORDER — LORATADINE 10 MG PO TABS
10.0000 mg | ORAL_TABLET | Freq: Once | ORAL | Status: AC
  Administered 2022-09-11: 10 mg via ORAL
  Filled 2022-09-11: qty 1

## 2022-09-11 MED FILL — Dexamethasone Sodium Phosphate Inj 100 MG/10ML: INTRAMUSCULAR | Qty: 1 | Status: AC

## 2022-09-11 NOTE — Patient Instructions (Signed)
[facility]  Discharge Instructions: Thank you for choosing [facility] to provide your oncology and hematology care.   If you have a lab appointment with the [facility], please go directly to the [facility] and check in at the registration area.   Wear comfortable clothing and clothing appropriate for easy access to any Portacath or PICC line.   We strive to give you quality time with your provider. You may need to reschedule your appointment if you arrive late (15 or more minutes).  Arriving late affects you and other patients whose appointments are after yours.  Also, if you miss three or more appointments without notifying the office, you may be dismissed from the clinic at the provider's discretion.      For prescription refill requests, have your pharmacy contact our office and allow 72 hours for refills to be completed.    Today you received the following chemotherapy and/or immunotherapy agents: etoposide      To help prevent nausea and vomiting after your treatment, we encourage you to take your nausea medication as directed.  BELOW ARE SYMPTOMS THAT SHOULD BE REPORTED IMMEDIATELY: *FEVER GREATER THAN 100.4 F (38 C) OR HIGHER *CHILLS OR SWEATING *NAUSEA AND VOMITING THAT IS NOT CONTROLLED WITH YOUR NAUSEA MEDICATION *UNUSUAL SHORTNESS OF BREATH *UNUSUAL BRUISING OR BLEEDING *URINARY PROBLEMS (pain or burning when urinating, or frequent urination) *BOWEL PROBLEMS (unusual diarrhea, constipation, pain near the anus) TENDERNESS IN MOUTH AND THROAT WITH OR WITHOUT PRESENCE OF ULCERS (sore throat, sores in mouth, or a toothache) UNUSUAL RASH, SWELLING OR PAIN  UNUSUAL VAGINAL DISCHARGE OR ITCHING   Items with * indicate a potential emergency and should be followed up as soon as possible or go to the Emergency Department if any problems should occur.  Please show the CHEMOTHERAPY ALERT CARD or IMMUNOTHERAPY ALERT CARD at check-in to  the Emergency Department and triage nurse.  Should you have questions after your visit or need to cancel or reschedule your appointment, please contact [facility]  Dept: [phone]  and follow the prompts.  Office hours are 8:00 a.m. to 4:30 p.m. Monday - Friday. Please note that voicemails left after 4:00 p.m. may not be returned until the following business day.  We are closed weekends and major holidays. You have access to a nurse at all times for urgent questions. Please call the main number to the clinic Dept: [phone] and follow the prompts.   For any non-urgent questions, you may also contact your provider using MyChart. We now offer e-Visits for anyone 9 and older to request care online for non-urgent symptoms. For details visit [email or web address].   Also download the MyChart app! Go to the app store, search "MyChart", open the app, select [facility], and log in with your MyChart username and password.  Masks are optional in the cancer centers. If you would like for your care team to wear a mask while they are taking care of you, please let them know. You may have one support person who is at least 72 years old accompany you for your appointments.   Blood Transfusion, Adult, Care After The following information offers guidance on how to care for yourself after your procedure. Your health care provider may also give you more specific instructions. If you have problems or questions, contact your health care provider. What can I expect after the procedure? After the procedure, it is common to have: Bruising and soreness where  the IV was inserted. A headache. Follow these instructions at home: IV insertion site care     Follow instructions from your health care provider about how to take care of your IV insertion site. Make sure you: Wash your hands with soap and water for at least 20 seconds before and after you change your bandage (dressing).  If soap and water are not available, use hand sanitizer. Change your dressing as told by your health care provider. Check your IV insertion site every day for signs of infection. Check for: Redness, swelling, or pain. Bleeding from the site. Warmth. Pus or a bad smell. General instructions Take over-the-counter and prescription medicines only as told by your health care provider. Rest as told by your health care provider. Return to your normal activities as told by your health care provider. Keep all follow-up visits. Lab tests may need to be done at certain periods to recheck your blood counts. Contact a health care provider if: You have itching or red, swollen areas of skin (hives). You have a fever or chills. You have pain in the head, back, or chest. You feel anxious or you feel weak after doing your normal activities. You have redness, swelling, warmth, or pain around the IV insertion site. You have blood coming from the IV insertion site that does not stop with pressure. You have pus or a bad smell coming from your IV insertion site. If you received your blood transfusion in an outpatient setting, you will be told whom to contact to report any reactions. Get help right away if: You have symptoms of a serious allergic or immune system reaction, including: Trouble breathing or shortness of breath. Swelling of the face, feeling flushed, or widespread rash. Dark urine or blood in the urine. Fast heartbeat. These symptoms may be an emergency. Get help right away. Call 911. Do not wait to see if the symptoms will go away. Do not drive yourself to the hospital. Summary Bruising and soreness around the IV insertion site are common. Check your IV insertion site every day for signs of infection. Rest as told by your health care provider. Return to your normal activities as told by your health care provider. Get help right away for symptoms of a serious allergic or immune system reaction  to the blood transfusion. This information is not intended to replace advice given to you by your health care provider. Make sure you discuss any questions you have with your health care provider. Document Revised: 02/28/2022 Document Reviewed: 02/28/2022 Elsevier Patient Education  [place].

## 2022-09-12 ENCOUNTER — Inpatient Hospital Stay: Payer: Medicare Other

## 2022-09-12 VITALS — BP 139/92 | HR 97 | Resp 17

## 2022-09-12 DIAGNOSIS — Z5111 Encounter for antineoplastic chemotherapy: Secondary | ICD-10-CM | POA: Diagnosis not present

## 2022-09-12 DIAGNOSIS — K5900 Constipation, unspecified: Secondary | ICD-10-CM | POA: Diagnosis not present

## 2022-09-12 DIAGNOSIS — C3411 Malignant neoplasm of upper lobe, right bronchus or lung: Secondary | ICD-10-CM

## 2022-09-12 DIAGNOSIS — C787 Secondary malignant neoplasm of liver and intrahepatic bile duct: Secondary | ICD-10-CM | POA: Diagnosis not present

## 2022-09-12 DIAGNOSIS — J90 Pleural effusion, not elsewhere classified: Secondary | ICD-10-CM | POA: Diagnosis not present

## 2022-09-12 DIAGNOSIS — D649 Anemia, unspecified: Secondary | ICD-10-CM | POA: Diagnosis not present

## 2022-09-12 LAB — TYPE AND SCREEN
ABO/RH(D): B POS
Antibody Screen: NEGATIVE
Unit division: 0
Unit division: 0

## 2022-09-12 LAB — BPAM RBC
Blood Product Expiration Date: 202310032359
Blood Product Expiration Date: 202310032359
ISSUE DATE / TIME: 202309280738
ISSUE DATE / TIME: 202309280738
Unit Type and Rh: 1700
Unit Type and Rh: 1700

## 2022-09-12 MED ORDER — SODIUM CHLORIDE 0.9 % IV SOLN: Freq: Once | INTRAVENOUS | Status: AC

## 2022-09-12 MED ORDER — SODIUM CHLORIDE 0.9 % IV SOLN
80.0000 mg/m2 | Freq: Once | INTRAVENOUS | Status: AC
  Administered 2022-09-12: 210 mg via INTRAVENOUS
  Filled 2022-09-12: qty 10.5

## 2022-09-12 MED ORDER — SODIUM CHLORIDE 0.9 % IV SOLN
10.0000 mg | Freq: Once | INTRAVENOUS | Status: AC
  Administered 2022-09-12: 10 mg via INTRAVENOUS
  Filled 2022-09-12: qty 10

## 2022-09-12 MED ORDER — TRILACICLIB DIHYDROCHLORIDE INJECTION 300 MG
240.0000 mg/m2 | Freq: Once | INTRAVENOUS | Status: AC
  Administered 2022-09-12: 600 mg via INTRAVENOUS
  Filled 2022-09-12: qty 40

## 2022-09-12 NOTE — Patient Instructions (Signed)
[facility]  Discharge Instructions: Thank you for choosing [facility] to provide your oncology and hematology care.   If you have a lab appointment with the [facility], please go directly to the [facility] and check in at the registration area.   Wear comfortable clothing and clothing appropriate for easy access to any Portacath or PICC line.   We strive to give you quality time with your provider. You may need to reschedule your appointment if you arrive late (15 or more minutes).  Arriving late affects you and other patients whose appointments are after yours.  Also, if you miss three or more appointments without notifying the office, you may be dismissed from the clinic at the provider's discretion.      For prescription refill requests, have your pharmacy contact our office and allow 72 hours for refills to be completed.    Today you received the following chemotherapy and/or immunotherapy agents: etoposide      To help prevent nausea and vomiting after your treatment, we encourage you to take your nausea medication as directed.  BELOW ARE SYMPTOMS THAT SHOULD BE REPORTED IMMEDIATELY: *FEVER GREATER THAN 100.4 F (38 C) OR HIGHER *CHILLS OR SWEATING *NAUSEA AND VOMITING THAT IS NOT CONTROLLED WITH YOUR NAUSEA MEDICATION *UNUSUAL SHORTNESS OF BREATH *UNUSUAL BRUISING OR BLEEDING *URINARY PROBLEMS (pain or burning when urinating, or frequent urination) *BOWEL PROBLEMS (unusual diarrhea, constipation, pain near the anus) TENDERNESS IN MOUTH AND THROAT WITH OR WITHOUT PRESENCE OF ULCERS (sore throat, sores in mouth, or a toothache) UNUSUAL RASH, SWELLING OR PAIN  UNUSUAL VAGINAL DISCHARGE OR ITCHING   Items with * indicate a potential emergency and should be followed up as soon as possible or go to the Emergency Department if any problems should occur.  Please show the CHEMOTHERAPY ALERT CARD or IMMUNOTHERAPY ALERT CARD at check-in to  the Emergency Department and triage nurse.  Should you have questions after your visit or need to cancel or reschedule your appointment, please contact [facility]  Dept: [phone]  and follow the prompts.  Office hours are 8:00 a.m. to 4:30 p.m. Monday - Friday. Please note that voicemails left after 4:00 p.m. may not be returned until the following business day.  We are closed weekends and major holidays. You have access to a nurse at all times for urgent questions. Please call the main number to the clinic Dept: [phone] and follow the prompts.   For any non-urgent questions, you may also contact your provider using MyChart. We now offer e-Visits for anyone 75 and older to request care online for non-urgent symptoms. For details visit [email or web address].   Also download the MyChart app! Go to the app store, search "MyChart", open the app, select [facility], and log in with your MyChart username and password.  Masks are optional in the cancer centers. If you would like for your care team to wear a mask while they are taking care of you, please let them know. You may have one support person who is at least 72 years old accompany you for your appointments.   Blood Transfusion, Adult, Care After The following information offers guidance on how to care for yourself after your procedure. Your health care provider may also give you more specific instructions. If you have problems or questions, contact your health care provider. What can I expect after the procedure? After the procedure, it is common to have: Bruising and soreness where  the IV was inserted. A headache. Follow these instructions at home: IV insertion site care     Follow instructions from your health care provider about how to take care of your IV insertion site. Make sure you: Wash your hands with soap and water for at least 20 seconds before and after you change your bandage (dressing).  If soap and water are not available, use hand sanitizer. Change your dressing as told by your health care provider. Check your IV insertion site every day for signs of infection. Check for: Redness, swelling, or pain. Bleeding from the site. Warmth. Pus or a bad smell. General instructions Take over-the-counter and prescription medicines only as told by your health care provider. Rest as told by your health care provider. Return to your normal activities as told by your health care provider. Keep all follow-up visits. Lab tests may need to be done at certain periods to recheck your blood counts. Contact a health care provider if: You have itching or red, swollen areas of skin (hives). You have a fever or chills. You have pain in the head, back, or chest. You feel anxious or you feel weak after doing your normal activities. You have redness, swelling, warmth, or pain around the IV insertion site. You have blood coming from the IV insertion site that does not stop with pressure. You have pus or a bad smell coming from your IV insertion site. If you received your blood transfusion in an outpatient setting, you will be told whom to contact to report any reactions. Get help right away if: You have symptoms of a serious allergic or immune system reaction, including: Trouble breathing or shortness of breath. Swelling of the face, feeling flushed, or widespread rash. Dark urine or blood in the urine. Fast heartbeat. These symptoms may be an emergency. Get help right away. Call 911. Do not wait to see if the symptoms will go away. Do not drive yourself to the hospital. Summary Bruising and soreness around the IV insertion site are common. Check your IV insertion site every day for signs of infection. Rest as told by your health care provider. Return to your normal activities as told by your health care provider. Get help right away for symptoms of a serious allergic or immune system reaction  to the blood transfusion. This information is not intended to replace advice given to you by your health care provider. Make sure you discuss any questions you have with your health care provider. Document Revised: 02/28/2022 Document Reviewed: 02/28/2022 Elsevier Patient Education  [place].

## 2022-09-17 ENCOUNTER — Inpatient Hospital Stay: Payer: Medicare Other | Attending: Internal Medicine

## 2022-09-17 ENCOUNTER — Other Ambulatory Visit: Payer: Self-pay

## 2022-09-17 DIAGNOSIS — C787 Secondary malignant neoplasm of liver and intrahepatic bile duct: Secondary | ICD-10-CM | POA: Insufficient documentation

## 2022-09-17 DIAGNOSIS — C3411 Malignant neoplasm of upper lobe, right bronchus or lung: Secondary | ICD-10-CM | POA: Insufficient documentation

## 2022-09-17 DIAGNOSIS — I10 Essential (primary) hypertension: Secondary | ICD-10-CM | POA: Diagnosis not present

## 2022-09-17 DIAGNOSIS — Z7984 Long term (current) use of oral hypoglycemic drugs: Secondary | ICD-10-CM | POA: Diagnosis not present

## 2022-09-17 DIAGNOSIS — E119 Type 2 diabetes mellitus without complications: Secondary | ICD-10-CM | POA: Diagnosis not present

## 2022-09-17 DIAGNOSIS — Z79899 Other long term (current) drug therapy: Secondary | ICD-10-CM | POA: Insufficient documentation

## 2022-09-17 DIAGNOSIS — Z794 Long term (current) use of insulin: Secondary | ICD-10-CM | POA: Insufficient documentation

## 2022-09-17 DIAGNOSIS — Z5111 Encounter for antineoplastic chemotherapy: Secondary | ICD-10-CM | POA: Insufficient documentation

## 2022-09-17 DIAGNOSIS — Z7901 Long term (current) use of anticoagulants: Secondary | ICD-10-CM | POA: Insufficient documentation

## 2022-09-17 LAB — CMP (CANCER CENTER ONLY)
ALT: 15 U/L (ref 0–44)
AST: 13 U/L — ABNORMAL LOW (ref 15–41)
Albumin: 3.8 g/dL (ref 3.5–5.0)
Alkaline Phosphatase: 58 U/L (ref 38–126)
Anion gap: 6 (ref 5–15)
BUN: 22 mg/dL (ref 8–23)
CO2: 32 mmol/L (ref 22–32)
Calcium: 8.6 mg/dL — ABNORMAL LOW (ref 8.9–10.3)
Chloride: 101 mmol/L (ref 98–111)
Creatinine: 1.09 mg/dL (ref 0.61–1.24)
GFR, Estimated: >60 mL/min (ref >60)
Glucose, Bld: 132 mg/dL — ABNORMAL HIGH (ref 70–99)
Potassium: 4.3 mmol/L (ref 3.5–5.1)
Sodium: 139 mmol/L (ref 135–145)
Total Bilirubin: 0.4 mg/dL (ref 0.3–1.2)
Total Protein: 6.2 g/dL — ABNORMAL LOW (ref 6.5–8.1)

## 2022-09-17 LAB — CBC WITH DIFFERENTIAL (CANCER CENTER ONLY)
Abs Immature Granulocytes: 0.06 10*3/uL (ref 0.00–0.07)
Basophils Absolute: 0 10*3/uL (ref 0.0–0.1)
Basophils Relative: 1 %
Eosinophils Absolute: 0.1 10*3/uL (ref 0.0–0.5)
Eosinophils Relative: 1 %
HCT: 29.7 % — ABNORMAL LOW (ref 39.0–52.0)
Hemoglobin: 9.7 g/dL — ABNORMAL LOW (ref 13.0–17.0)
Immature Granulocytes: 1 %
Lymphocytes Relative: 11 %
Lymphs Abs: 0.5 10*3/uL — ABNORMAL LOW (ref 0.7–4.0)
MCH: 30 pg (ref 26.0–34.0)
MCHC: 32.7 g/dL (ref 30.0–36.0)
MCV: 92 fL (ref 80.0–100.0)
Monocytes Absolute: 0.2 10*3/uL (ref 0.1–1.0)
Monocytes Relative: 4 %
Neutro Abs: 3.4 10*3/uL (ref 1.7–7.7)
Neutrophils Relative %: 82 %
Platelet Count: 170 10*3/uL (ref 150–400)
RBC: 3.23 MIL/uL — ABNORMAL LOW (ref 4.22–5.81)
RDW: 15.7 % — ABNORMAL HIGH (ref 11.5–15.5)
WBC Count: 4.2 10*3/uL (ref 4.0–10.5)
nRBC: 0 % (ref 0.0–0.2)

## 2022-09-17 LAB — SAMPLE TO BLOOD BANK

## 2022-09-18 ENCOUNTER — Ambulatory Visit (INDEPENDENT_AMBULATORY_CARE_PROVIDER_SITE_OTHER): Payer: Medicare Other

## 2022-09-18 ENCOUNTER — Ambulatory Visit (INDEPENDENT_AMBULATORY_CARE_PROVIDER_SITE_OTHER): Payer: Medicare Other | Admitting: Adult Health

## 2022-09-18 ENCOUNTER — Encounter: Payer: Self-pay | Admitting: Adult Health

## 2022-09-18 VITALS — BP 116/58 | HR 96 | Temp 97.7°F | Ht 69.5 in | Wt 292.6 lb

## 2022-09-18 DIAGNOSIS — C3411 Malignant neoplasm of upper lobe, right bronchus or lung: Secondary | ICD-10-CM | POA: Diagnosis not present

## 2022-09-18 DIAGNOSIS — J90 Pleural effusion, not elsewhere classified: Secondary | ICD-10-CM

## 2022-09-18 DIAGNOSIS — J9811 Atelectasis: Secondary | ICD-10-CM | POA: Diagnosis not present

## 2022-09-18 DIAGNOSIS — J9611 Chronic respiratory failure with hypoxia: Secondary | ICD-10-CM | POA: Diagnosis not present

## 2022-09-18 NOTE — Progress Notes (Signed)
_0  ID: [name], male    DOB: 1951/02/24, 71 y.o.   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  Completed palliative radiotherapy.  Undergoing active palliative chemotherapy  TEST/EVENTS :  Chest x-ray 11/29/2021: No significant effusion on the right following a thoracentesis, no pneumothorax.  Some scarring in the right base, density in the medial portion of the right upper lobe history of small cell with radiation.   CT Chest Feb 2022: CT scan of the chest reveals some pleural studding as well as small pleural effusion on the right side. The patient's images have been independently reviewed by me.     CT chest 04/17/2022: Accumulation of right pleural fluid. The patient's images have been independently reviewed by me.     CT chest 07/04/2022: Small 11 x 12 mm nodular area in the right apex.  Increased right-sided pleural effusion.  Diminished size of hepatic metastasis.   09/18/2022 Follow up ; COPD , metastatic small cell lung cancer, recurrent right-sided pleural effusion Patient presents for a 1 week follow-up.  Patient has underlying COPD.  Has metastatic small cell lung cancer undergoing active chemo and recurrent right-sided pleural effusion.  He has underwent multiple thoracentesis along with chest tube talc pleurodesis.  Completed talc pleurodesis February, May and most recently September 2023.  Last visit patient was having exertional desaturations.  He was started on oxygen 2 L.  Continued on Breztri twice daily.  Since last visit patient is feeling about the same gets short of breath with heavy activities sitting still he does fine.  He had  no increased cough or congestion.  Has low energy and stamina.  Patient says he has not had to use his oxygen at home very much. Chest x-ray today shows resolution of right apex pneumothorax. Stable RUL atx and right pleural effusion.   Patient has underlying sleep apnea is on CPAP at bedtime with oxygen   Allergies  Allergen Reactions   Bupropion     Racing heart   Hydrochlorothiazide Other (See Comments)    Cramping to lower extremities    Immunization History  Administered Date(s) Administered   Fluad Quad(high Dose 65+) 10/26/2020, 08/13/2021   Influenza Inj Mdck Quad Pf 09/18/2017   Influenza,inj,Quad PF,6+ Mos 10/27/2018, 11/08/2019   Influenza-Unspecified 09/18/2017   PFIZER(Purple Top)SARS-COV-2 Vaccination 03/12/2020, 04/02/2020, 12/11/2020   Pneumococcal Conjugate-13 08/17/2015   Pneumococcal Polysaccharide-23 11/25/2012, 10/27/2018   Td 08/27/2016    Past Medical History:  Diagnosis Date   Chickenpox    Chronic knee pain    Chronic low back pain    COPD exacerbation ([place]) 10/11/2021   COPD with exacerbation ([place]) 10/12/2021   Coronary artery calcification seen on CAT scan 11/2021   Coronary CTA 11/27/2021: Moderate to large right pleural effusion and compressive atelectasis right lung base. => Coronary Calcium Score 657.  Diffuse RCA calcification.  Minimal mild disease in the LAD and diagonal branches. == Overall limited study.  Notable artifact.   Essential hypertension    GERD (gastroesophageal reflux disease)    Hyperlipidemia    Malignant neoplasm of upper lobe of right lung ([place]) 04/04/2020   OSA (obstructive sleep apnea)    With nighttime oxygen supplementation  T4, M3, M1 C Metastatic Small Cell Lung Cancer 03/2020   large right upper lobe/right hilar mass with ipsilateral and contralateral mediastinal and right supraclavicular lymphadenopathy in addition to multiple liver lesios. He has disease progression after the first line of chemotherapy in December  2021.   Testosterone deficiency    Type 2 diabetes mellitus (HCC)     Tobacco History: Social History   Tobacco Use  Smoking Status Former  Smokeless Tobacco Never   Counseling given: Not Answered   Outpatient Medications Prior to Visit  Medication Sig Dispense Refill   albuterol (VENTOLIN HFA) 108 (90 Base) MCG/ACT inhaler Inhale 2 puffs into the lungs every 6 (six) hours as needed for wheezing or shortness of breath. 18 g 0   amLODipine (NORVASC) 10 MG tablet TAKE 1 TABLET BY MOUTH EVERY DAY FOR BLOOD PRESSURE 90 tablet 3   B-D 3CC LUER-LOK SYR 22GX1" 22G X 1" 3 ML MISC USE AS INSTRUCTED FOR TESTOSTERONE INJECTION EVERY 2 WEEKS 10 each 2   bisoprolol (ZEBETA) 5 MG tablet TAKE 1 TABLET (5 MG TOTAL) BY MOUTH DAILY. 90 tablet 2   Black Pepper-Turmeric (TURMERIC COMPLEX/BLACK PEPPER PO) Take 1 tablet by mouth in the morning and at bedtime.     blood glucose meter kit and supplies KIT Dispense based on patient and insurance preference. Use up to four times daily as directed. 1 each 0   Budeson-Glycopyrrol-Formoterol (BREZTRI AEROSPHERE) 160-9-4.8 MCG/ACT AERO Inhale 2 puffs into the lungs in the morning and at bedtime. 10.7 g 3   chlorpheniramine-HYDROcodone (TUSSIONEX) 10-8 MG/5ML SUER Take 5 mLs by mouth at bedtime as needed for cough. 473 mL 0   Coenzyme Q10 (COQ10) 200 MG CAPS Take 200 mg by mouth at bedtime.     ELIQUIS 5 MG TABS tablet TAKE 1 TABLET BY MOUTH TWICE A DAY 180 tablet 2   esomeprazole (NEXIUM) 20 MG capsule Take 20 mg by mouth in the morning.     gabapentin (NEURONTIN) 300 MG capsule TAKE 1 CAPSULE (300 MG TOTAL) BY MOUTH 2 (TWO) TIMES DAILY. FOR BACK PAIN. 180 capsule 2   glipiZIDE (GLUCOTROL) 10 MG tablet TAKE 1 TABLET (10 MG TOTAL) BY MOUTH 2 (TWO) TIMES DAILY BEFORE A MEAL. FOR DIABETES. 180 tablet 0   glucose blood (ONETOUCH ULTRA) test strip USE UP TO 4 TIMES DAILY AS DIRECTED 400 strip 5   Insulin Glargine (BASAGLAR KWIKPEN) 100 UNIT/ML Inject 24 Units into the  skin at bedtime. For diabetes. 30 mL 1   Insulin Pen Needle (BD PEN NEEDLE NANO U/F) 32G X 4 MM MISC Use with insulin as prescribed Dx Code: E11.9 200 each 3   ipratropium-albuterol (DUONEB) 0.5-2.5 (3) MG/3ML SOLN Take 3 mLs by nebulization every 4 (four) hours while awake for 3 days, THEN 3 mLs every 4 (four) hours as needed (shortness of breath or wheezing). 360 mL 0   KRILL OIL PO Take 1 capsule by mouth daily.     Lancets MISC USE UP TO 3 TIMES DAILY AS DIRECTED 100 each 2   losartan (COZAAR) 50 MG tablet TAKE 1 TABLET (50 MG TOTAL) BY MOUTH DAILY. FOR BLOOD PRESSURE. 90 tablet 2   Melatonin 10 MG CAPS Take 10 mg by mouth at bedtime.     metFORMIN (GLUCOPHAGE) 1000 MG tablet TAKE 1 TABLET (1,000 MG TOTAL) BY MOUTH 2 (TWO) TIMES DAILY WITH A MEAL. FOR DIABETES 180 tablet 0   Multiple Vitamin (MULTI-VITAMINS) TABS Take 1 tablet by mouth in the morning.  oxymetazoline (AFRIN) 0.05 % nasal spray Place 1 spray into both nostrils daily as needed for congestion.     pioglitazone (ACTOS) 45 MG tablet TAKE 1 TABLET (45 MG TOTAL) BY MOUTH DAILY. FOR DIABETES. 90 tablet 0   pravastatin (PRAVACHOL) 40 MG tablet TAKE 1 TABLET BY MOUTH EVERY DAY IN THE EVENING FOR CHOLESTEROL 90 tablet 2   testosterone cypionate (DEPOTESTOSTERONE CYPIONATE) 200 MG/ML injection Inject 200 mg into the muscle every 14 (fourteen) days.     traMADol (ULTRAM) 50 MG tablet Take 1 tablet (50 mg total) by mouth every 6 (six) hours as needed. 15 tablet 0   No facility-administered medications prior to visit.     Review of Systems:   Constitutional:   No  weight loss, night sweats,  Fevers, chills, +fatigue, or  lassitude.  HEENT:   No headaches,  Difficulty swallowing,  Tooth/dental problems, or  Sore throat,                No sneezing, itching, ear ache, nasal congestion, post nasal drip,   CV:  No chest pain,  Orthopnea, PND,  anasarca, dizziness, palpitations, syncope.   GI  No heartburn, indigestion, abdominal pain,  nausea, vomiting, diarrhea, change in bowel habits, loss of appetite, bloody stools.   Resp:   No chest wall deformity  Skin: no rash or lesions.  GU: no dysuria, change in color of urine, no urgency or frequency.  No flank pain, no hematuria   MS:  No joint pain or swelling.  No decreased range of motion.  No back pain.    Physical Exam  BP (!) 116/58 (BP Location: Left Arm, Patient Position: Sitting, Cuff Size: Large)   Pulse 96   Temp 97.7 F (36.5 C) (Oral)   Ht 5' 9.5" (1.765 m)   Wt 292 lb 9.6 oz (132.7 kg)   SpO2 97%   BMI 42.59 kg/m   GEN: A/Ox3; pleasant , NAD, BMI 42, rolling walker   HEENT:  [place]/AT,  EACs-clear, TMs-wnl, NOSE-clear, THROAT-clear, no lesions, no postnasal drip or exudate noted.   NECK:  Supple w/ fair ROM; no JVD; normal carotid impulses w/o bruits; no thyromegaly or nodules palpated; no lymphadenopathy.    RESP diminished breath sounds in the bases,  no accessory muscle use, no dullness to percussion  CARD:  RRR, no m/r/g, tr  peripheral edema, pulses intact, no cyanosis or clubbing.  GI:   Soft & nt; nml bowel sounds; no organomegaly or masses detected.   Musco: Warm bil, no deformities or joint swelling noted.   Neuro: alert, no focal deficits noted.    Skin: Warm, no lesions or rashes    Lab Results:  CBC   BNP   Imaging: DG Chest 2 View  Result Date: 09/18/2022 CLINICAL DATA:  Recurrent RIGHT pleural effusion EXAM: CHEST - 2 VIEW COMPARISON:  09/09/2022 FINDINGS: RIGHT jugular Port-A-Cath with tip projecting over RIGHT atrium. Enlargement of cardiac silhouette. Volume loss and scarring in RIGHT upper lobe with mediastinal shift to RIGHT. Persistent RIGHT pleural effusion and mild basilar atelectasis. LEFT lung grossly clear. No pneumothorax or acute osseous findings. IMPRESSION: Chronic RIGHT upper lobe atelectasis and RIGHT pleural effusion/basilar atelectasis. Resolution of previously identified RIGHT apex pneumothorax. No acute  abnormalities. Electronically Signed   By: Lavonia Dana M.D.   On: 09/18/2022 14:03   DG Chest 2 View  Result Date: 09/09/2022 CLINICAL DATA:  Pleural effusion. EXAM: CHEST - 2 VIEW COMPARISON:  September 02, 2022 FINDINGS: Stable  right injectable port. Mildly enlarged cardiac silhouette. Persistent right apical pneumothorax. Persistent moderate in size right pleural effusion. Persistent abnormal appearance of the right lung from known right lung cancer. Possible interstitial pulmonary edema. IMPRESSION: 1. Persistent right apical pneumothorax. 2. Persistent moderate in size right pleural effusion. 3. Possible interstitial pulmonary edema. Electronically Signed   By: Fidela Salisbury M.D.   On: 09/09/2022 12:20   DG CHEST PORT 1 VIEW  Result Date: 09/02/2022 CLINICAL DATA:  [id number] with recurrent right pleural effusion, previous XRT for lung cancer. EXAM: PORTABLE CHEST 1 VIEW COMPARISON:  Portable chest yesterday at 5:23 p.m. Chest CT with IV contrast 08/15/2022. FINDINGS: 5:41 a.m. There continues to be a small right apical pneumothorax estimated up to 5% chest volume. This is unchanged in the interval. Consolidation of the right lung base continues to be seen as well, with right upper lobe paramediastinal radiation fibrosis also similar appearing. There is no contralateral mediastinal shift. Remaining lungs are clear. Heart size and vasculature are normal. There is a stable mediastinum, stable small right pleural effusion. Also stable right IJ port catheter insertion with tip in the right atrium. IMPRESSION: 1. Small right apical pneumothorax estimated up to 5% volume, unchanged. 2. Small right pleural effusion with basilar consolidation/atelectasis. 3. Unchanged right suprahilar radiation fibrosis. Electronically Signed   By: Telford Nab M.D.   On: 09/02/2022 07:41   DG CHEST PORT 1 VIEW  Result Date: 09/01/2022 CLINICAL DATA:  Chest tube removal EXAM: PORTABLE CHEST 1 VIEW COMPARISON:   09/01/2022 at 3:17 p.m. FINDINGS: Single frontal view of the chest demonstrates interval removal of the right pigtail drainage catheter. There is a small recurrent right apical pneumothorax volume estimated approximately 10%. Consolidation at the right lung base likely reflects atelectasis. Post radiation changes in the right suprahilar region again noted. No tension effect. No effusion. Left chest is clear. Stable right chest wall port. No acute bony abnormalities. IMPRESSION: 1. Small right apical pneumothorax after right chest tube removal, volume estimated at least 10%. No mediastinal shift or tension effect. 2. Right basilar atelectasis. 3. Stable post radiation changes right suprahilar region. Critical Value/emergent results were called by telephone at the time of interpretation on 09/01/2022 at 5:56 pm to provider [facility] , who verbally acknowledged these results. Electronically Signed   By: Randa Ngo M.D.   On: 09/01/2022 17:58   DG Chest Port 1 View  Result Date: 09/01/2022 CLINICAL DATA:  Chest tube placement. EXAM: PORTABLE CHEST 1 VIEW COMPARISON:  05/27/2022.  CT, 08/15/2022. FINDINGS: Right inferior hemithorax pigtail chest tube has been placed since the previous CT. There has been a significant decrease in right pleural fluid. Right hilar and medial upper lobe opacity consistent with radiation induced scarring is stable. Remainder of the right lung is clear. No pneumothorax. Clear left lung.  No left pleural effusion. Right anterior chest wall Port-A-Cath is stable. IMPRESSION: 1. Status post right sided chest tube placement with the chest tube lying in the lower hemithorax above the right hemidiaphragm. Decreased right pleural fluid following chest tube placement. 2. No pneumothorax. Electronically Signed   By: Lajean Manes M.D.   On: 09/01/2022 15:42          No data to display          No results found for: "NITRICOXIDE"      Assessment & Plan:   Recurrent right  pleural effusion Recurrent right pleural effusion status post multiple thoracentesis and talc pleurodesis via chest tube.  Chest  x-ray today shows resolution of right apical pneumothorax.  Persistent small to moderate right pleural effusion.  Patient appears clinically stable.  We will hold on thoracentesis currently.  Has a CT chest coming up in the next week or 2.  Advised patient that if his symptoms worsen with increased shortness of breath, orthopnea, leg swelling he is to call us back for sooner follow-up and we will set up thoracentesis.  Plan Patient Instructions  Continue on Oxygen 2l/m to keep O2 sats >88-90%.  Continue on Breztri 2 puffs Twice daily  ,rinse after use.  Albuterol inhaler As needed   Activity as tolerated.  Follow up for CT chest on 09/29/22 .  Call if you feel your breathing is getting worse and need fluid pulled off (thoracentesis )  Follow up in 4-6 weeks with Dr. Valeta Harms and As needed    Please contact office for sooner follow up if symptoms do not improve or worsen or seek emergency care      Small cell lung cancer, right upper lobe [facility]) Continue follow-up with oncology.  Chronic respiratory failure with hypoxia (HCC) Continue on oxygen with activity and at bedtime with CPAP to keep oxygen levels greater than 88 to 90%     Rexene Edison, NP 09/18/2022

## 2022-09-18 NOTE — Assessment & Plan Note (Signed)
Continue on oxygen with activity and at bedtime with CPAP to keep oxygen levels greater than 88 to 90%

## 2022-09-18 NOTE — Patient Instructions (Addendum)
Continue on Oxygen 2l/m to keep O2 sats >88-90%.  Continue on Breztri 2 puffs Twice daily  ,rinse after use.  Albuterol inhaler As needed   Activity as tolerated.  Follow up for CT chest on 09/29/22 .  Call if you feel your breathing is getting worse and need fluid pulled off (thoracentesis )  Follow up in 4-6 weeks with Dr. Valeta Harms and As needed    Please contact office for sooner follow up if symptoms do not improve or worsen or seek emergency care

## 2022-09-18 NOTE — Assessment & Plan Note (Signed)
Continue follow-up with oncology 

## 2022-09-18 NOTE — Assessment & Plan Note (Signed)
Recurrent right pleural effusion status post multiple thoracentesis and talc pleurodesis via chest tube.  Chest x-ray today shows resolution of right apical pneumothorax.  Persistent small to moderate right pleural effusion.  Patient appears clinically stable.  We will hold on thoracentesis currently.  Has a CT chest coming up in the next week or 2.  Advised patient that if his symptoms worsen with increased shortness of breath, orthopnea, leg swelling he is to call us back for sooner follow-up and we will set up thoracentesis.  Plan Patient Instructions  Continue on Oxygen 2l/m to keep O2 sats >88-90%.  Continue on Breztri 2 puffs Twice daily  ,rinse after use.  Albuterol inhaler As needed   Activity as tolerated.  Follow up for CT chest on 09/29/22 .  Call if you feel your breathing is getting worse and need fluid pulled off (thoracentesis )  Follow up in 4-6 weeks with Dr. Valeta Harms and As needed    Please contact office for sooner follow up if symptoms do not improve or worsen or seek emergency care

## 2022-09-19 ENCOUNTER — Other Ambulatory Visit: Payer: Self-pay | Admitting: Medical Oncology

## 2022-09-19 DIAGNOSIS — D649 Anemia, unspecified: Secondary | ICD-10-CM

## 2022-09-24 ENCOUNTER — Encounter: Payer: Self-pay | Admitting: Primary Care

## 2022-09-24 ENCOUNTER — Ambulatory Visit (INDEPENDENT_AMBULATORY_CARE_PROVIDER_SITE_OTHER): Payer: Medicare Other | Admitting: Primary Care

## 2022-09-24 ENCOUNTER — Inpatient Hospital Stay: Payer: Medicare Other

## 2022-09-24 VITALS — BP 130/72 | HR 95 | Temp 97.3°F | Ht 69.5 in | Wt 293.0 lb

## 2022-09-24 DIAGNOSIS — I10 Essential (primary) hypertension: Secondary | ICD-10-CM | POA: Diagnosis not present

## 2022-09-24 DIAGNOSIS — Z5111 Encounter for antineoplastic chemotherapy: Secondary | ICD-10-CM | POA: Diagnosis not present

## 2022-09-24 DIAGNOSIS — Z7901 Long term (current) use of anticoagulants: Secondary | ICD-10-CM | POA: Diagnosis not present

## 2022-09-24 DIAGNOSIS — C3411 Malignant neoplasm of upper lobe, right bronchus or lung: Secondary | ICD-10-CM

## 2022-09-24 DIAGNOSIS — C787 Secondary malignant neoplasm of liver and intrahepatic bile duct: Secondary | ICD-10-CM | POA: Diagnosis not present

## 2022-09-24 DIAGNOSIS — E1165 Type 2 diabetes mellitus with hyperglycemia: Secondary | ICD-10-CM | POA: Diagnosis not present

## 2022-09-24 DIAGNOSIS — Z794 Long term (current) use of insulin: Secondary | ICD-10-CM | POA: Diagnosis not present

## 2022-09-24 DIAGNOSIS — D649 Anemia, unspecified: Secondary | ICD-10-CM

## 2022-09-24 DIAGNOSIS — E119 Type 2 diabetes mellitus without complications: Secondary | ICD-10-CM | POA: Diagnosis not present

## 2022-09-24 LAB — CBC WITH DIFFERENTIAL (CANCER CENTER ONLY)
Abs Immature Granulocytes: 0.02 10*3/uL (ref 0.00–0.07)
Basophils Absolute: 0 10*3/uL (ref 0.0–0.1)
Basophils Relative: 1 %
Eosinophils Absolute: 0 10*3/uL (ref 0.0–0.5)
Eosinophils Relative: 1 %
HCT: 28.7 % — ABNORMAL LOW (ref 39.0–52.0)
Hemoglobin: 9.2 g/dL — ABNORMAL LOW (ref 13.0–17.0)
Immature Granulocytes: 1 %
Lymphocytes Relative: 15 %
Lymphs Abs: 0.5 10*3/uL — ABNORMAL LOW (ref 0.7–4.0)
MCH: 30.2 pg (ref 26.0–34.0)
MCHC: 32.1 g/dL (ref 30.0–36.0)
MCV: 94.1 fL (ref 80.0–100.0)
Monocytes Absolute: 0.5 10*3/uL (ref 0.1–1.0)
Monocytes Relative: 15 %
Neutro Abs: 2.1 10*3/uL (ref 1.7–7.7)
Neutrophils Relative %: 67 %
Platelet Count: 91 10*3/uL — ABNORMAL LOW (ref 150–400)
RBC: 3.05 MIL/uL — ABNORMAL LOW (ref 4.22–5.81)
RDW: 16.7 % — ABNORMAL HIGH (ref 11.5–15.5)
WBC Count: 3 10*3/uL — ABNORMAL LOW (ref 4.0–10.5)
nRBC: 0 % (ref 0.0–0.2)

## 2022-09-24 LAB — CMP (CANCER CENTER ONLY)
ALT: 14 U/L (ref 0–44)
AST: 16 U/L (ref 15–41)
Albumin: 3.9 g/dL (ref 3.5–5.0)
Alkaline Phosphatase: 60 U/L (ref 38–126)
Anion gap: 8 (ref 5–15)
BUN: 16 mg/dL (ref 8–23)
CO2: 29 mmol/L (ref 22–32)
Calcium: 8.2 mg/dL — ABNORMAL LOW (ref 8.9–10.3)
Chloride: 103 mmol/L (ref 98–111)
Creatinine: 1.12 mg/dL (ref 0.61–1.24)
GFR, Estimated: >60 mL/min (ref >60)
Glucose, Bld: 183 mg/dL — ABNORMAL HIGH (ref 70–99)
Potassium: 4.5 mmol/L (ref 3.5–5.1)
Sodium: 140 mmol/L (ref 135–145)
Total Bilirubin: 0.4 mg/dL (ref 0.3–1.2)
Total Protein: 6.8 g/dL (ref 6.5–8.1)

## 2022-09-24 LAB — POCT GLYCOSYLATED HEMOGLOBIN (HGB A1C): Hemoglobin A1C: 7.1 % — AB (ref 4.0–5.6)

## 2022-09-24 LAB — SAMPLE TO BLOOD BANK

## 2022-09-24 NOTE — Addendum Note (Signed)
Addended by: Ellamae Sia on: 09/24/2022 04:29 PM   Modules accepted: Orders

## 2022-09-24 NOTE — Assessment & Plan Note (Signed)
Improved and controlled with A1C of 7.1.  Continue metformin 1000 mg BID, Glipizide 10 mg BID, Actos 45 mg daily, Basaglar 24 units HS.  Urine microalbumin due and pending. Managed on statin.  Pneumonia vaccine UTD.  Follow up in 6 months.

## 2022-09-24 NOTE — Progress Notes (Signed)
Subjective:    Patient ID: Adam Wise, male    DOB: 09/19/1951, 71 y.o.   MRN: [id number]  HPI  Adam Wise is a very pleasant 71 y.o. male with a history of type 2 diabetes, hypertension, OSA, small cell lung cancer, recurrent pleural effusion, chronic respiratory failure with hypoxia, hyperlipidemia, hypogonadism, chronic fatigue, exertional dyspnea who presents today for follow-up of diabetes.  Current medications include: Metformin 1000 mg twice daily, glipizide 10 mg twice daily, pioglitazone 45 mg daily, Basaglar insulin 24 units at bedtime.   He is checking his blood glucose 2 times daily and is getting readings of:  AM fasting: low to mid 100's Bedtime: 200's to 300's.   Last A1C: 8.2 in July 2023, 7.1 today Last Eye Exam: UTD Last Foot Exam: Due Pneumonia Vaccination: 2019 Urine Microalbumin: due, pending. Statin: pravastatin   Dietary changes since last visit: None. He does receive Decadron IV once every three weeks.    Exercise: None.   BP Readings from Last 3 Encounters:  09/24/22 130/72  09/18/22 (!) 116/58  09/12/22 (!) 139/92      Review of Systems  Respiratory:  Positive for shortness of breath.   Cardiovascular:  Negative for chest pain.  Neurological:  Negative for numbness and headaches.         Past Medical History:  Diagnosis Date   Chickenpox    Chronic knee pain    Chronic low back pain    COPD exacerbation (HCC) 10/11/2021   COPD with exacerbation (HCC) 10/12/2021   Coronary artery calcification seen on CAT scan 11/2021   Coronary CTA 11/27/2021: Moderate to large right pleural effusion and compressive atelectasis right lung base. => Coronary Calcium Score 657.  Diffuse RCA calcification.  Minimal mild disease in the LAD and diagonal branches. == Overall limited study.  Notable artifact.   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 He has disease progression after the first line of chemotherapy in December 2021.   Testosterone deficiency    Type 2 diabetes mellitus (HCC)     Social History   Socioeconomic History   Marital status: Married    Spouse name: Not on file   Number of children: Not on file   Years of education: Not on file   Highest education level: Not on file  Occupational History   Not on file  Tobacco Use   Smoking status: Former   Smokeless tobacco: Never  Vaping Use   Vaping Use: Never used  Substance and Sexual Activity   Alcohol use: Not Currently    Comment: rarely   Drug use: Not Currently   Sexual activity: Yes  Other Topics Concern   Not on file  Social History Narrative   Married.   Moved from [place].   Retired.   Social Determinants of Health   Financial Resource Strain: Low Risk  (02/14/2022)   Overall Financial Resource Strain (CARDIA)    Difficulty of Paying Living Expenses: Not hard at all  Food Insecurity: No Food Insecurity (02/14/2022)   Hunger Vital Sign    Worried About Running Out of Food  in the Last Year: Never true    [place] in the Last Year: Never true  Transportation Needs: No Transportation Needs (02/14/2022)   PRAPARE - Hydrologist (Medical): No    Lack of Transportation (Non-Medical): No  Physical Activity: Inactive (02/14/2022)   Exercise Vital Sign    Days of Exercise per Week: 0 days    Minutes of Exercise per Session: 0 min  Stress: No Stress Concern Present (02/14/2022)   [place]    Feeling of Stress : Only a little  Social Connections: Moderately  Integrated (02/14/2022)   Social Connection and Isolation Panel [NHANES]    Frequency of Communication with Friends and Family: Twice a week    Frequency of Social Gatherings with Friends and Family: Twice a week    Attends Religious Services: 1 to 4 times per year    Active Member of [employer] or Organizations: No    Attends Archivist Meetings: Never    Marital Status: Married  Human resources officer Violence: Not At Risk (02/14/2022)   Humiliation, Afraid, Rape, and Kick questionnaire    Fear of Current or Ex-Partner: No    Emotionally Abused: No    Physically Abused: No    Sexually Abused: No    Past Surgical History:  Procedure Laterality Date   CHEST TUBE INSERTION Right 01/20/2022   Procedure: CHEST TUBE INSERTION;  Surgeon: Candee Furbish, MD;  Location: [facility] END[facility]COPY;  Service: Pulmonary;  Laterality: Right;  w/ Talc Pleurodesis, planned admit for Obs afterwards   CHEST TUBE INSERTION Right 04/28/2022   Procedure: INSERTION PLEURAL DRAINAGE CATHETER - Pigtail tail, drainage;  Surgeon: Candee Furbish, MD;  Location: [facility] ENDOSCOPY;  Service: Pulmonary;  Laterality: Right;  talc pleurodesis   CHEST TUBE INSERTION Right 09/01/2022   Procedure: INSERTION PLEURAL DRAINAGE CATHETER;  Surgeon: Juanito Doom, MD;  Location: [place];  Service: Cardiopulmonary;  Laterality: Right;  pigtail catheter placement with talc pleurodesis   COLONOSCOPY WITH PROPOFOL N/A 12/17/2018   Procedure: COLONOSCOPY WITH PROPOFOL;  Surgeon: Virgel Manifold, MD;  Location: [facility] ENDOSCOPY;  Service: Gastroenterology;  Laterality: N/A;   IR IMAGING GUIDED PORT INSERTION  04/17/2020   IR THORACENTESIS ASP PLEURAL SPACE W/IMG GUIDE  11/29/2021   IR THORACENTESIS ASP PLEURAL SPACE W/IMG GUIDE  01/03/2022   JOINT REPLACEMENT Bilateral    REPLACEMENT TOTAL KNEE BILATERAL  2015   TALC PLEURODESIS  09/01/2022   Procedure: Pietro Cassis;  Surgeon: Juanito Doom, MD;  Location: [facility] ENDOSCOPY;  Service:  Cardiopulmonary;;   [place] ECHOCARDIOGRAM  05/2020   a) 05/2020: EF 55 to 60%.  No R WMA.  Mild LVH.  Indeterminate LVEDP.  Unable to assess RVP.  Normal aortic and mitral valves.  Mildly elevated RAP.; b) 09/2021: EF 50-55%. No RWMA. Mild LVH. ~ LVEDP. Mild LA Dilation. NORMAL RV/RAP.  Normal MV/AoV.    Family History  Problem Relation Age of Onset   Heart attack Mother    Cancer Mother    Hypertension Mother    Arthritis Father    Asthma Father    Cancer Father    COPD Father    Heart attack Father    Hypertension Sister    Cancer Sister    Diabetes Sister    Asthma Sister    Cancer Sister    COPD Sister    Arthritis Sister  Asthma Sister    Diabetes Sister    Birth defects Maternal Grandfather    Arthritis Paternal Grandmother    Diabetes Paternal Grandmother    Arthritis Paternal Grandfather    Asthma Son    Other Neg Hx        pituitary disorder    Allergies  Allergen Reactions   Bupropion     Racing heart   Hydrochlorothiazide Other (See Comments)    Cramping to lower extremities    Current Outpatient Medications on File Prior to Visit  Medication Sig Dispense Refill   albuterol (VENTOLIN HFA) 108 (90 Base) MCG/ACT inhaler Inhale 2 puffs into the lungs every 6 (six) hours as needed for wheezing or shortness of breath. 18 g 0   amLODipine (NORVASC) 10 MG tablet TAKE 1 TABLET BY MOUTH EVERY DAY FOR BLOOD PRESSURE 90 tablet 3   B-D 3CC LUER-LOK SYR 22GX1" 22G X 1" 3 ML MISC USE AS INSTRUCTED FOR TESTOSTERONE INJECTION EVERY 2 WEEKS 10 each 2   bisoprolol (ZEBETA) 5 MG tablet TAKE 1 TABLET (5 MG TOTAL) BY MOUTH DAILY. 90 tablet 2   Black Pepper-Turmeric (TURMERIC COMPLEX/BLACK PEPPER PO) Take 1 tablet by mouth in the morning and at bedtime.     blood glucose meter kit and supplies KIT Dispense based on patient and insurance preference. Use up to four times daily as directed. 1 each 0   Budeson-Glycopyrrol-Formoterol (BREZTRI  AEROSPHERE) 160-9-4.8 MCG/ACT AERO Inhale 2 puffs into the lungs in the morning and at bedtime. 10.7 g 3   chlorpheniramine-HYDROcodone (TUSSIONEX) 10-8 MG/5ML SUER Take 5 mLs by mouth at bedtime as needed for cough. 473 mL 0   Coenzyme Q10 (COQ10) 200 MG CAPS Take 200 mg by mouth at bedtime.     ELIQUIS 5 MG TABS tablet TAKE 1 TABLET BY MOUTH TWICE A DAY 180 tablet 2   esomeprazole (NEXIUM) 20 MG capsule Take 20 mg by mouth in the morning.     gabapentin (NEURONTIN) 300 MG capsule TAKE 1 CAPSULE (300 MG TOTAL) BY MOUTH 2 (TWO) TIMES DAILY. FOR BACK PAIN. 180 capsule 2   glipiZIDE (GLUCOTROL) 10 MG tablet TAKE 1 TABLET (10 MG TOTAL) BY MOUTH 2 (TWO) TIMES DAILY BEFORE A MEAL. FOR DIABETES. 180 tablet 0   glucose blood (ONETOUCH ULTRA) test strip USE UP TO 4 TIMES DAILY AS DIRECTED 400 strip 5   Insulin Glargine (BASAGLAR KWIKPEN) 100 UNIT/ML Inject 24 Units into the skin at bedtime. For diabetes. 30 mL 1   Insulin Pen Needle (BD PEN NEEDLE NANO U/F) 32G X 4 MM MISC Use with insulin as prescribed Dx Code: E11.9 200 each 3   ipratropium-albuterol (DUONEB) 0.5-2.5 (3) MG/3ML SOLN Take 3 mLs by nebulization every 4 (four) hours while awake for 3 days, THEN 3 mLs every 4 (four) hours as needed (shortness of breath or wheezing). 360 mL 0   KRILL OIL PO Take 1 capsule by mouth daily.     Lancets MISC USE UP TO 3 TIMES DAILY AS DIRECTED 100 each 2   losartan (COZAAR) 50 MG tablet TAKE 1 TABLET (50 MG TOTAL) BY MOUTH DAILY. FOR BLOOD PRESSURE. 90 tablet 2   Melatonin 10 MG CAPS Take 10 mg by mouth at bedtime.     metFORMIN (GLUCOPHAGE) 1000 MG tablet TAKE 1 TABLET (1,000 MG TOTAL) BY MOUTH 2 (TWO) TIMES DAILY WITH A MEAL. FOR DIABETES 180 tablet 0   Multiple Vitamin (MULTI-VITAMINS) TABS Take 1 tablet by mouth in the morning.  oxymetazoline (AFRIN) 0.05 % nasal spray Place 1 spray into both nostrils daily as needed for congestion.     pioglitazone (ACTOS) 45 MG tablet TAKE 1 TABLET (45 MG TOTAL) BY MOUTH  DAILY. FOR DIABETES. 90 tablet 0   pravastatin (PRAVACHOL) 40 MG tablet TAKE 1 TABLET BY MOUTH EVERY DAY IN THE EVENING FOR CHOLESTEROL 90 tablet 2   testosterone cypionate (DEPOTESTOSTERONE CYPIONATE) 200 MG/ML injection Inject 200 mg into the muscle every 14 (fourteen) days.     traMADol (ULTRAM) 50 MG tablet Take 1 tablet (50 mg total) by mouth every 6 (six) hours as needed. (Patient not taking: Reported on 09/24/2022) 15 tablet 0   No current facility-administered medications on file prior to visit.    BP 130/72   Pulse 95   Temp (!) 97.3 F (36.3 C) (Temporal)   Ht 5' 9.5" (1.765 m)   Wt 293 lb (132.9 kg)   SpO2 98%   BMI 42.65 kg/m  Objective:   Physical Exam Cardiovascular:     Rate and Rhythm: Normal rate and regular rhythm.  Pulmonary:     Effort: Pulmonary effort is normal.     Breath sounds: Normal breath sounds. No wheezing or rales.  Musculoskeletal:     Cervical back: Neck supple.  Skin:    General: Skin is warm and dry.  Neurological:     Mental Status: He is alert and oriented to person, place, and time.           Assessment & Plan:   Problem List Items Addressed This Visit       Endocrine   Type 2 diabetes mellitus ([place]) - Primary (Chronic)    Improved and controlled with A1C of 7.1.  Continue metformin 1000 mg BID, Glipizide 10 mg BID, Actos 45 mg daily, Basaglar 24 units HS.  Urine microalbumin due and pending. Managed on statin.  Pneumonia vaccine UTD.  Follow up in 6 months.       Relevant Orders   Microalbumin/Creatinine Ratio, Urine   POCT glycosylated hemoglobin (Hb A1C) (Completed)       [name], NP

## 2022-09-24 NOTE — Patient Instructions (Signed)
Your A1C today is 7.1!  Keep up the great work.  Please schedule a follow up visit for 6 months for a diabetes check.  It was a pleasure to see you today!

## 2022-09-25 ENCOUNTER — Other Ambulatory Visit: Payer: Self-pay

## 2022-09-29 ENCOUNTER — Ambulatory Visit (HOSPITAL_COMMUNITY)
Admission: RE | Admit: 2022-09-29 | Discharge: 2022-09-29 | Disposition: A | Discharge status: Home / Self Care | Payer: Medicare Other | Source: Ambulatory Visit | Attending: Physician Assistant | Admitting: Physician Assistant

## 2022-09-29 DIAGNOSIS — C3411 Malignant neoplasm of upper lobe, right bronchus or lung: Secondary | ICD-10-CM | POA: Diagnosis not present

## 2022-09-29 MED ORDER — GADOBUTROL 1 MMOL/ML IV SOLN
10.0000 mL | Freq: Once | INTRAVENOUS | Status: AC | PRN
  Administered 2022-09-29: 10 mL via INTRAVENOUS

## 2022-09-29 MED ORDER — HEPARIN SOD (PORK) LOCK FLUSH 100 UNIT/ML IV SOLN
INTRAVENOUS | Status: AC
  Filled 2022-09-29: qty 5

## 2022-09-29 MED ORDER — SODIUM CHLORIDE (PF) 0.9 % IJ SOLN
INTRAMUSCULAR | Status: AC
  Filled 2022-09-29: qty 50

## 2022-09-29 MED ORDER — IOHEXOL 300 MG/ML  SOLN
100.0000 mL | Freq: Once | INTRAMUSCULAR | Status: AC | PRN
  Administered 2022-09-29: 100 mL via INTRAVENOUS

## 2022-10-01 ENCOUNTER — Other Ambulatory Visit: Payer: Self-pay

## 2022-10-01 DIAGNOSIS — C3411 Malignant neoplasm of upper lobe, right bronchus or lung: Secondary | ICD-10-CM

## 2022-10-02 ENCOUNTER — Encounter: Payer: Self-pay | Admitting: Internal Medicine

## 2022-10-02 ENCOUNTER — Inpatient Hospital Stay: Payer: Medicare Other

## 2022-10-02 ENCOUNTER — Inpatient Hospital Stay (HOSPITAL_BASED_OUTPATIENT_CLINIC_OR_DEPARTMENT_OTHER): Payer: Medicare Other | Admitting: Internal Medicine

## 2022-10-02 VITALS — BP 118/74 | HR 87 | Temp 98.2°F | Resp 16 | Wt 295.5 lb

## 2022-10-02 DIAGNOSIS — C3411 Malignant neoplasm of upper lobe, right bronchus or lung: Secondary | ICD-10-CM

## 2022-10-02 DIAGNOSIS — I10 Essential (primary) hypertension: Secondary | ICD-10-CM | POA: Diagnosis not present

## 2022-10-02 DIAGNOSIS — E119 Type 2 diabetes mellitus without complications: Secondary | ICD-10-CM | POA: Diagnosis not present

## 2022-10-02 DIAGNOSIS — D649 Anemia, unspecified: Secondary | ICD-10-CM

## 2022-10-02 DIAGNOSIS — Z7901 Long term (current) use of anticoagulants: Secondary | ICD-10-CM | POA: Diagnosis not present

## 2022-10-02 DIAGNOSIS — C787 Secondary malignant neoplasm of liver and intrahepatic bile duct: Secondary | ICD-10-CM | POA: Diagnosis not present

## 2022-10-02 DIAGNOSIS — Z95828 Presence of other vascular implants and grafts: Secondary | ICD-10-CM | POA: Diagnosis not present

## 2022-10-02 DIAGNOSIS — Z5111 Encounter for antineoplastic chemotherapy: Secondary | ICD-10-CM | POA: Diagnosis not present

## 2022-10-02 LAB — SAMPLE TO BLOOD BANK

## 2022-10-02 LAB — CBC WITH DIFFERENTIAL (CANCER CENTER ONLY)
Abs Immature Granulocytes: 0.03 10*3/uL (ref 0.00–0.07)
Basophils Absolute: 0 10*3/uL (ref 0.0–0.1)
Basophils Relative: 1 %
Eosinophils Absolute: 0.1 10*3/uL (ref 0.0–0.5)
Eosinophils Relative: 3 %
HCT: 28 % — ABNORMAL LOW (ref 39.0–52.0)
Hemoglobin: 8.9 g/dL — ABNORMAL LOW (ref 13.0–17.0)
Immature Granulocytes: 1 %
Lymphocytes Relative: 12 %
Lymphs Abs: 0.4 10*3/uL — ABNORMAL LOW (ref 0.7–4.0)
MCH: 30.5 pg (ref 26.0–34.0)
MCHC: 31.8 g/dL (ref 30.0–36.0)
MCV: 95.9 fL (ref 80.0–100.0)
Monocytes Absolute: 0.5 10*3/uL (ref 0.1–1.0)
Monocytes Relative: 15 %
Neutro Abs: 2.2 10*3/uL (ref 1.7–7.7)
Neutrophils Relative %: 68 %
Platelet Count: 211 10*3/uL (ref 150–400)
RBC: 2.92 MIL/uL — ABNORMAL LOW (ref 4.22–5.81)
RDW: 17.3 % — ABNORMAL HIGH (ref 11.5–15.5)
WBC Count: 3.2 10*3/uL — ABNORMAL LOW (ref 4.0–10.5)
nRBC: 0 % (ref 0.0–0.2)

## 2022-10-02 LAB — CMP (CANCER CENTER ONLY)
ALT: 13 U/L (ref 0–44)
AST: 15 U/L (ref 15–41)
Albumin: 3.8 g/dL (ref 3.5–5.0)
Alkaline Phosphatase: 60 U/L (ref 38–126)
Anion gap: 9 (ref 5–15)
BUN: 20 mg/dL (ref 8–23)
CO2: 27 mmol/L (ref 22–32)
Calcium: 8.3 mg/dL — ABNORMAL LOW (ref 8.9–10.3)
Chloride: 103 mmol/L (ref 98–111)
Creatinine: 1.24 mg/dL (ref 0.61–1.24)
GFR, Estimated: >60 mL/min (ref >60)
Glucose, Bld: 179 mg/dL — ABNORMAL HIGH (ref 70–99)
Potassium: 4.3 mmol/L (ref 3.5–5.1)
Sodium: 139 mmol/L (ref 135–145)
Total Bilirubin: 0.3 mg/dL (ref 0.3–1.2)
Total Protein: 6.3 g/dL — ABNORMAL LOW (ref 6.5–8.1)

## 2022-10-02 MED ORDER — HEPARIN SOD (PORK) LOCK FLUSH 100 UNIT/ML IV SOLN
500.0000 [IU] | Freq: Once | INTRAVENOUS | Status: AC
  Administered 2022-10-02: 500 [IU]

## 2022-10-02 MED ORDER — SODIUM CHLORIDE 0.9% FLUSH
10.0000 mL | Freq: Once | INTRAVENOUS | Status: AC
  Administered 2022-10-02: 10 mL

## 2022-10-02 NOTE — Progress Notes (Signed)
[place] Telephone:(336) [phone]   Fax:(336) [phone]  OFFICE PROGRESS NOTE  [name], NP [place] [address]  DIAGNOSIS: Relapsed extensive stage (T4, N3, M1c)  small cell lung cancer diagnosed in April 2021 and presented with large right upper lobe/right hilar mass with ipsilateral and contralateral mediastinal and right supraclavicular lymphadenopathy in addition to multiple liver lesions. He has disease progression after the first line of chemotherapy in December 2021.  PRIOR THERAPY: 1) Palliative radiotherapy to the right upper lobe lung mass under the care of Dr. Lisbeth Renshaw. 2) Systemic chemotherapy with carboplatin for AUC of 5 on day 1, etoposide 100 mg/M2 on days 1, 2 and 3 in addition to Imfinzi 1500 mg IV every 3 weeks with chemotherapy then every 4 weeks for maintenance if the patient has no evidence for progression.  He will also receive Cosela 240 mg/m2 on the days of the chemotherapy.  Status post 9 cycles.  Starting from cycle #5 the patient will be on maintenance treatment with immunotherapy with Imfinzi 1500 mg IV every 4 weeks. Last dose of chemotherapy was given on November 13, 2020. This treatment was discontinued secondary to disease progression 3) Systemic chemotherapy with carboplatin for AUC of 5 on day 1, etoposide 100 mg/M2 on days 1, 2 and 3, Tecentriq 1200 mg IV every 3 weeks as well as Cosela 250 mg/M2 on the days of the chemotherapy every 3 weeks.  First dose December 18, 2020.  Status post 8 cycles. 4) Zepzelca (lurbinectedin) 3.2 mgm2 IV every 3 weeks. Last dose on 01/23/22. Status post 12 cycles.  5) Palliative systemic chemotherapy with irinotecan 65 mg/m2 on days 1 and 8 IV every 3 weeks.  Status post 3 cycles.  Last dose was given April 01, 2022 discontinued secondary to disease progression   CURRENT THERAPY: systemic chemotherapy with carboplatin for AUC of 5 on day 1, etoposide 100 Mg/M2 on days 1, 2 and 3 with  Cosela before the chemotherapy.  First dose expected to start on 05/28/2022.  Status post 6 cycles.  Starting from cycle #5 his carboplatin will be reduced to AUC of 4 and 2 etoposide 80 Mg/M2.  INTERVAL HISTORY: Adam Wise 71 y.o. male returns to the clinic today for follow-up visit accompanied by his wife.  The patient is feeling fine today with no concerning complaints except for the baseline fatigue and shortness of breath with exertion.  He has been tolerating his systemic chemotherapy fairly well.  He has no nausea, vomiting, diarrhea or constipation.  He has no headache or visual changes.  He denied having any chest pain but continues to have mild shortness of breath and cough with no hemoptysis.  He denied having any recent weight loss or night sweats.  He had repeat CT scan of the chest, abdomen and pelvis as well as MRI of the brain performed recently and he is here for evaluation and discussion of his scan results and treatment options.  MEDICAL HISTORY: Past Medical History:  Diagnosis Date   Chickenpox    Chronic knee pain    Chronic low back pain    COPD exacerbation ([facility]) 10/11/2021   COPD with exacerbation ([facility]) 10/12/2021   Coronary artery calcification seen on CAT scan 11/2021   Coronary CTA 11/27/2021: Moderate to large right pleural effusion and compressive atelectasis right lung base. => Coronary Calcium Score 657.  Diffuse RCA calcification.  Minimal mild disease in the LAD and diagonal branches. ==  Overall limited study.  Notable artifact.   Essential hypertension    GERD (gastroesophageal reflux disease)    Hyperlipidemia    Malignant neoplasm of upper lobe of right lung ([facility]) 04/04/2020   OSA (obstructive sleep apnea)    With nighttime oxygen supplementation   T4, M3, M1 C Metastatic Small Cell Lung Cancer 03/2020   large right upper lobe/right hilar mass with ipsilateral and contralateral mediastinal and right supraclavicular lymphadenopathy in addition to multiple  liver lesios. He has disease progression after the first line of chemotherapy in December 2021.   Testosterone deficiency    Type 2 diabetes mellitus (HCC)     ALLERGIES:  is allergic to bupropion and hydrochlorothiazide.  MEDICATIONS:  Current Outpatient Medications  Medication Sig Dispense Refill   albuterol (VENTOLIN HFA) 108 (90 Base) MCG/ACT inhaler Inhale 2 puffs into the lungs every 6 (six) hours as needed for wheezing or shortness of breath. 18 g 0   amLODipine (NORVASC) 10 MG tablet TAKE 1 TABLET BY MOUTH EVERY DAY FOR BLOOD PRESSURE 90 tablet 3   B-D 3CC LUER-LOK SYR 22GX1" 22G X 1" 3 ML MISC USE AS INSTRUCTED FOR TESTOSTERONE INJECTION EVERY 2 WEEKS 10 each 2   bisoprolol (ZEBETA) 5 MG tablet TAKE 1 TABLET (5 MG TOTAL) BY MOUTH DAILY. 90 tablet 2   Black Pepper-Turmeric (TURMERIC COMPLEX/BLACK PEPPER PO) Take 1 tablet by mouth in the morning and at bedtime.     blood glucose meter kit and supplies KIT Dispense based on patient and insurance preference. Use up to four times daily as directed. 1 each 0   Budeson-Glycopyrrol-Formoterol (BREZTRI AEROSPHERE) 160-9-4.8 MCG/ACT AERO Inhale 2 puffs into the lungs in the morning and at bedtime. 10.7 g 3   chlorpheniramine-HYDROcodone (TUSSIONEX) 10-8 MG/5ML SUER Take 5 mLs by mouth at bedtime as needed for cough. 473 mL 0   Coenzyme Q10 (COQ10) 200 MG CAPS Take 200 mg by mouth at bedtime.     ELIQUIS 5 MG TABS tablet TAKE 1 TABLET BY MOUTH TWICE A DAY 180 tablet 2   esomeprazole (NEXIUM) 20 MG capsule Take 20 mg by mouth in the morning.     gabapentin (NEURONTIN) 300 MG capsule TAKE 1 CAPSULE (300 MG TOTAL) BY MOUTH 2 (TWO) TIMES DAILY. FOR BACK PAIN. 180 capsule 2   glipiZIDE (GLUCOTROL) 10 MG tablet TAKE 1 TABLET (10 MG TOTAL) BY MOUTH 2 (TWO) TIMES DAILY BEFORE A MEAL. FOR DIABETES. 180 tablet 0   glucose blood (ONETOUCH ULTRA) test strip USE UP TO 4 TIMES DAILY AS DIRECTED 400 strip 5   Insulin Glargine (BASAGLAR KWIKPEN) 100 UNIT/ML  Inject 24 Units into the skin at bedtime. For diabetes. 30 mL 1   Insulin Pen Needle (BD PEN NEEDLE NANO U/F) 32G X 4 MM MISC Use with insulin as prescribed Dx Code: E11.9 200 each 3   ipratropium-albuterol (DUONEB) 0.5-2.5 (3) MG/3ML SOLN Take 3 mLs by nebulization every 4 (four) hours while awake for 3 days, THEN 3 mLs every 4 (four) hours as needed (shortness of breath or wheezing). 360 mL 0   KRILL OIL PO Take 1 capsule by mouth daily.     Lancets MISC USE UP TO 3 TIMES DAILY AS DIRECTED 100 each 2   losartan (COZAAR) 50 MG tablet TAKE 1 TABLET (50 MG TOTAL) BY MOUTH DAILY. FOR BLOOD PRESSURE. 90 tablet 2   Melatonin 10 MG CAPS Take 10 mg by mouth at bedtime.     metFORMIN (GLUCOPHAGE) 1000 MG tablet  TAKE 1 TABLET (1,000 MG TOTAL) BY MOUTH 2 (TWO) TIMES DAILY WITH A MEAL. FOR DIABETES 180 tablet 0   Multiple Vitamin (MULTI-VITAMINS) TABS Take 1 tablet by mouth in the morning.     oxymetazoline (AFRIN) 0.05 % nasal spray Place 1 spray into both nostrils daily as needed for congestion.     pioglitazone (ACTOS) 45 MG tablet TAKE 1 TABLET (45 MG TOTAL) BY MOUTH DAILY. FOR DIABETES. 90 tablet 0   pravastatin (PRAVACHOL) 40 MG tablet TAKE 1 TABLET BY MOUTH EVERY DAY IN THE EVENING FOR CHOLESTEROL 90 tablet 2   testosterone cypionate (DEPOTESTOSTERONE CYPIONATE) 200 MG/ML injection Inject 200 mg into the muscle every 14 (fourteen) days.     traMADol (ULTRAM) 50 MG tablet Take 1 tablet (50 mg total) by mouth every 6 (six) hours as needed. (Patient not taking: Reported on 09/24/2022) 15 tablet 0   No current facility-administered medications for this visit.    SURGICAL HISTORY:  Past Surgical History:  Procedure Laterality Date   CHEST TUBE INSERTION Right 01/20/2022   Procedure: CHEST TUBE INSERTION;  Surgeon: Candee Furbish, MD;  Location: [facility] ENDOSCOPY;  Service: Pulmonary;  Laterality: Right;  w/ Talc Pleurodesis, planned admit for Obs afterwards   CHEST TUBE INSERTION Right 04/28/2022    Procedure: INSERTION PLEURAL DRAINAGE CATHETER - Pigtail tail, drainage;  Surgeon: Candee Furbish, MD;  Location: [facility] ENDOSCOPY;  Service: Pulmonary;  Laterality: Right;  talc pleurodesis   CHEST TUBE INSERTION Right 09/01/2022   Procedure: INSERTION PLEURAL DRAINAGE CATHETER;  Surgeon: Juanito Doom, MD;  Location: [facility];  Service: Cardiopulmonary;  Laterality: Right;  pigtail catheter placement with talc pleurodesis   COLONOSCOPY WITH PROPOFOL N/A 12/17/2018   Procedure: COLONOSCOPY WITH PROPOFOL;  Surgeon: Virgel Manifold, MD;  Location: [facility] ENDOSCOPY;  Service: Gastroenterology;  Laterality: N/A;   IR IMAGING GUIDED PORT INSERTION  04/17/2020   IR THORACENTESIS ASP PLEURAL SPACE W/IMG GUIDE  11/29/2021   IR THORACENTESIS ASP PLEURAL SPACE W/IMG GUIDE  01/03/2022   JOINT REPLACEMENT Bilateral    REPLACEMENT TOTAL KNEE BILATERAL  2015   TALC PLEURODESIS  09/01/2022   Procedure: Pietro Cassis;  Surgeon: Juanito Doom, MD;  Location[facility] ENDOSCOPY;  Service: Cardiopulmonary;;   [place] ECHOCARDIOGRAM  05/2020   a) 05/2020: EF 55 to 60%.  No R WMA.  Mild LVH.  Indeterminate LVEDP.  Unable to assess RVP.  Normal aortic and mitral valves.  Mildly elevated RAP.; b) 09/2021: EF 50-55%. No RWMA. Mild LVH. ~ LVEDP. Mild LA Dilation. NORMAL RV/RAP.  Normal MV/AoV.    REVIEW OF SYSTEMS:  Constitutional: positive for fatigue Eyes: negative Ears, nose, mouth, throat, and face: negative Respiratory: positive for cough and dyspnea on exertion Cardiovascular: negative Gastrointestinal: negative Genitourinary:negative Integument/breast: negative Hematologic/lymphatic: negative Musculoskeletal:negative Neurological: negative Behavioral/Psych: negative Endocrine: negative Allergic/Immunologic: negative   PHYSICAL EXAMINATION: General appearance: alert, cooperative, fatigued, and no distress Head: Normocephalic, without obvious abnormality,  atraumatic Neck: no adenopathy, no JVD, supple, symmetrical, trachea midline, and thyroid not enlarged, symmetric, no tenderness/mass/nodules Lymph nodes: Cervical, supraclavicular, and axillary nodes normal. Resp: diminished breath sounds RLL and dullness to percussion RLL Back: symmetric, no curvature. ROM normal. No CVA tenderness. Cardio: regular rate and rhythm, S1, S2 normal, no murmur, click, rub or gallop GI: soft, non-tender; bowel sounds normal; no masses,  no organomegaly Extremities: extremities normal, atraumatic, no cyanosis or edema Neurologic: Alert and oriented X 3, normal strength and tone. Normal symmetric reflexes.  Normal coordination and gait  ECOG PERFORMANCE STATUS: 1 - Symptomatic but completely ambulatory  Blood pressure 118/74, pulse 87, temperature 98.2 F (36.8 C), temperature source Oral, resp. rate 16, weight 295 lb 8 oz (134 kg), SpO2 96 %.   LABORATORY DATA: Lab Results  Component Value Date   WBC 3.2 (L) 10/02/2022   HGB 8.9 (L) 10/02/2022   HCT 28.0 (L) 10/02/2022   MCV 95.9 10/02/2022   PLT 211 10/02/2022      Chemistry      Component Value Date/Time   NA 140 09/24/2022 1103   K 4.5 09/24/2022 1103   CL 103 09/24/2022 1103   CO2 29 09/24/2022 1103   BUN 16 09/24/2022 1103   CREATININE 1.12 09/24/2022 1103   CREATININE 1.45 (H) 10/18/2021 1533      Component Value Date/Time   CALCIUM 8.2 (L) 09/24/2022 1103   ALKPHOS 60 09/24/2022 1103   AST 16 09/24/2022 1103   ALT 14 09/24/2022 1103   BILITOT 0.4 09/24/2022 1103       RADIOGRAPHIC STUDIES:    ASSESSMENT AND PLAN: This is a very pleasant 71 year old Caucasian male diagnosed with extensive stage (T4, N3, M1C) small cell lung cancer presented with large right upper lobe lung mass in addition to mediastinal and right supraclavicular lymphadenopathy and multiple metastatic liver lesions diagnosed in April 2021. The patient initially underwent systemic chemotherapy with carboplatin for  an AUC of 5 on day 1, etoposide 100 mg per metered squared on days 1, 2, and 3 in addition to [facility] for myeloprotection.  He also received immunotherapy with Imfinzi on day 1 of every chemotherapy cycle.  He is status post 9 cycles.  Starting from cycle #5 he was on single agent immunotherapy with Imfinzi IV every 4 weeks.  The patient had evidence of disease progression on his scan from December 2021.  He was then started on systemic chemotherapy with carboplatin for an AUC of 5 on day 1, etoposide 100 mg per metered squared on days 1, 2, and 3 in addition to Tecentriq 1200 mg on day 1, the patient is status post 9 cycles.  Starting from cycle #5, the patient started maintenance immunotherapy with Tecentriq.  This was discontinued due to evidence of disease progression. The patient started third line treatment with Zepzelca (lurbinectedin) 3.2 Mg/M2 every 3-week status post 12 cycles of this treatment and he has been tolerating it fairly well except for fatigue.  This treatment was discontinued secondary to disease progression. He underwent fourth line treatment with systemic chemotherapy with irinotecan 65 Mg/M2 on days 1 and 8 every 3 weeks.  Status post 3 cycles.  He tolerated this treatment well with no concerning adverse effect except for mild fatigue. Unfortunately his scan showed evidence for disease progression especially in the liver. I recommended for the patient to discontinue his current treatment with irinotecan at this point. The patient was referred to Dr. Lennart Pall at [facility] for consideration of enrollment in the clinical trial with the [place].  He was not eligible for this trial.  He was also seen at [facility] for a second opinion and he did not like the clinical trial option offered to him. He started systemic chemotherapy again with carboplatin for AUC of 5 on day 1, etoposide 100 Mg/M2 on days 1, 2 and 3 with Cosela status  post 6 cycles. I did reduce the dose of carboplatin to AUC of 4 and  etoposide 80 Mg/M2 starting from cycle #5.  He has been tolerating this treatment well with no concerning adverse effect except for mild fatigue. He had repeat CT scan of the chest, abdomen and pelvis performed recently in addition to MRI of the brain. I personally and independently reviewed the scan and discussed the result with the patient and his wife. His scan showed no evidence for disease progression in the brain and he has mixed response in the chest with general stable disease and slightly enlarged liver lesion. I had a lengthy discussion with the patient and his wife about his condition and treatment options.  The patient mentioned that he is doing fine with this treatment and he would like to continue on the same regimen if possible.  He understands that the other options are very limited I would like to keep the same treatment for now. I will arrange for him to receive cycle #7 next week.  He understands the risks of continuing systemic chemotherapy for long time especially with the myelosuppression and also possibility of hypersensitivity to carboplatin but he is willing to take this risk. I will see him back for follow-up visit in around 4 weeks with the start of cycle #8. He was advised to call immediately if he has any other concerning symptoms in the interval. The patient voices understanding of current disease status and treatment options and is in agreement with the current care plan.  All questions were answered. The patient knows to call the clinic with any problems, questions or concerns. We can certainly see the patient much sooner if necessary.  The total time spent in the appointment was 35 minutes.  Disclaimer: This note was dictated with voice recognition software. Similar sounding words can inadvertently be transcribed and may not be corrected upon review. Eilleen Kempf, MD 10/02/22

## 2022-10-07 ENCOUNTER — Inpatient Hospital Stay: Payer: Medicare Other

## 2022-10-07 VITALS — BP 122/67 | HR 88 | Temp 98.2°F | Resp 20 | Wt 294.5 lb

## 2022-10-07 DIAGNOSIS — E119 Type 2 diabetes mellitus without complications: Secondary | ICD-10-CM | POA: Diagnosis not present

## 2022-10-07 DIAGNOSIS — C787 Secondary malignant neoplasm of liver and intrahepatic bile duct: Secondary | ICD-10-CM | POA: Diagnosis not present

## 2022-10-07 DIAGNOSIS — Z5111 Encounter for antineoplastic chemotherapy: Secondary | ICD-10-CM | POA: Diagnosis not present

## 2022-10-07 DIAGNOSIS — C3411 Malignant neoplasm of upper lobe, right bronchus or lung: Secondary | ICD-10-CM | POA: Diagnosis not present

## 2022-10-07 DIAGNOSIS — D649 Anemia, unspecified: Secondary | ICD-10-CM

## 2022-10-07 DIAGNOSIS — Z7901 Long term (current) use of anticoagulants: Secondary | ICD-10-CM | POA: Diagnosis not present

## 2022-10-07 DIAGNOSIS — I10 Essential (primary) hypertension: Secondary | ICD-10-CM | POA: Diagnosis not present

## 2022-10-07 LAB — CBC WITH DIFFERENTIAL (CANCER CENTER ONLY)
Abs Immature Granulocytes: 0.03 10*3/uL (ref 0.00–0.07)
Basophils Absolute: 0.1 10*3/uL (ref 0.0–0.1)
Basophils Relative: 1 %
Eosinophils Absolute: 0.1 10*3/uL (ref 0.0–0.5)
Eosinophils Relative: 2 %
HCT: 29.2 % — ABNORMAL LOW (ref 39.0–52.0)
Hemoglobin: 9.3 g/dL — ABNORMAL LOW (ref 13.0–17.0)
Immature Granulocytes: 1 %
Lymphocytes Relative: 10 %
Lymphs Abs: 0.5 10*3/uL — ABNORMAL LOW (ref 0.7–4.0)
MCH: 30.3 pg (ref 26.0–34.0)
MCHC: 31.8 g/dL (ref 30.0–36.0)
MCV: 95.1 fL (ref 80.0–100.0)
Monocytes Absolute: 0.6 10*3/uL (ref 0.1–1.0)
Monocytes Relative: 12 %
Neutro Abs: 3.8 10*3/uL (ref 1.7–7.7)
Neutrophils Relative %: 74 %
Platelet Count: 220 10*3/uL (ref 150–400)
RBC: 3.07 MIL/uL — ABNORMAL LOW (ref 4.22–5.81)
RDW: 17.8 % — ABNORMAL HIGH (ref 11.5–15.5)
WBC Count: 5.1 10*3/uL (ref 4.0–10.5)
nRBC: 0 % (ref 0.0–0.2)

## 2022-10-07 LAB — CMP (CANCER CENTER ONLY)
ALT: 13 U/L (ref 0–44)
AST: 18 U/L (ref 15–41)
Albumin: 3.9 g/dL (ref 3.5–5.0)
Alkaline Phosphatase: 56 U/L (ref 38–126)
Anion gap: 10 (ref 5–15)
BUN: 17 mg/dL (ref 8–23)
CO2: 28 mmol/L (ref 22–32)
Calcium: 8.7 mg/dL — ABNORMAL LOW (ref 8.9–10.3)
Chloride: 103 mmol/L (ref 98–111)
Creatinine: 1.18 mg/dL (ref 0.61–1.24)
GFR, Estimated: >60 mL/min (ref >60)
Glucose, Bld: 151 mg/dL — ABNORMAL HIGH (ref 70–99)
Potassium: 3.9 mmol/L (ref 3.5–5.1)
Sodium: 141 mmol/L (ref 135–145)
Total Bilirubin: 0.4 mg/dL (ref 0.3–1.2)
Total Protein: 6.5 g/dL (ref 6.5–8.1)

## 2022-10-07 LAB — SAMPLE TO BLOOD BANK

## 2022-10-07 MED ORDER — HEPARIN SOD (PORK) LOCK FLUSH 100 UNIT/ML IV SOLN
500.0000 [IU] | Freq: Once | INTRAVENOUS | Status: AC | PRN
  Administered 2022-10-07: 500 [IU]

## 2022-10-07 MED ORDER — TRILACICLIB DIHYDROCHLORIDE INJECTION 300 MG
240.0000 mg/m2 | Freq: Once | INTRAVENOUS | Status: AC
  Administered 2022-10-07: 600 mg via INTRAVENOUS
  Filled 2022-10-07: qty 40

## 2022-10-07 MED ORDER — SODIUM CHLORIDE 0.9 % IV SOLN
530.0000 mg | Freq: Once | INTRAVENOUS | Status: AC
  Administered 2022-10-07: 530 mg via INTRAVENOUS
  Filled 2022-10-07: qty 53

## 2022-10-07 MED ORDER — SODIUM CHLORIDE 0.9 % IV SOLN
150.0000 mg | Freq: Once | INTRAVENOUS | Status: AC
  Administered 2022-10-07: 150 mg via INTRAVENOUS
  Filled 2022-10-07: qty 150

## 2022-10-07 MED ORDER — SODIUM CHLORIDE 0.9 % IV SOLN
10.0000 mg | Freq: Once | INTRAVENOUS | Status: AC
  Administered 2022-10-07: 10 mg via INTRAVENOUS
  Filled 2022-10-07: qty 10

## 2022-10-07 MED ORDER — FAMOTIDINE IN NACL 20-0.9 MG/50ML-% IV SOLN
20.0000 mg | Freq: Once | INTRAVENOUS | Status: AC
  Administered 2022-10-07: 20 mg via INTRAVENOUS
  Filled 2022-10-07: qty 50

## 2022-10-07 MED ORDER — PALONOSETRON HCL INJECTION 0.25 MG/5ML
0.2500 mg | Freq: Once | INTRAVENOUS | Status: AC
  Administered 2022-10-07: 0.25 mg via INTRAVENOUS
  Filled 2022-10-07: qty 5

## 2022-10-07 MED ORDER — SODIUM CHLORIDE 0.9 % IV SOLN
80.0000 mg/m2 | Freq: Once | INTRAVENOUS | Status: AC
  Administered 2022-10-07: 210 mg via INTRAVENOUS
  Filled 2022-10-07: qty 10.5

## 2022-10-07 MED ORDER — SODIUM CHLORIDE 0.9 % IV SOLN: Freq: Once | INTRAVENOUS | Status: AC

## 2022-10-07 MED ORDER — SODIUM CHLORIDE 0.9% FLUSH
10.0000 mL | INTRAVENOUS | Status: DC | PRN
  Administered 2022-10-07: 10 mL

## 2022-10-07 MED FILL — Dexamethasone Sodium Phosphate Inj 100 MG/10ML: INTRAMUSCULAR | Qty: 1 | Status: AC

## 2022-10-07 NOTE — Patient Instructions (Signed)
[facility] ONCOLOGY  Discharge Instructions: Thank you for choosing [facility] to provide your oncology and hematology care.   If you have a lab appointment with the [facility], please go directly to the [facility] and check in at the registration area.   Wear comfortable clothing and clothing appropriate for easy access to any Portacath or PICC line.   We strive to give you quality time with your provider. You may need to reschedule your appointment if you arrive late (15 or more minutes).  Arriving late affects you and other patients whose appointments are after yours.  Also, if you miss three or more appointments without notifying the office, you may be dismissed from the clinic at the provider's discretion.      For prescription refill requests, have your pharmacy contact our office and allow 72 hours for refills to be completed.    Today you received the following chemotherapy and/or immunotherapy agents: Cosela, Carboplatin, and Etoposide.   To help prevent nausea and vomiting after your treatment, we encourage you to take your nausea medication as directed.  BELOW ARE SYMPTOMS THAT SHOULD BE REPORTED IMMEDIATELY: *FEVER GREATER THAN 100.4 F (38 C) OR HIGHER *CHILLS OR SWEATING *NAUSEA AND VOMITING THAT IS NOT CONTROLLED WITH YOUR NAUSEA MEDICATION *UNUSUAL SHORTNESS OF BREATH *UNUSUAL BRUISING OR BLEEDING *URINARY PROBLEMS (pain or burning when urinating, or frequent urination) *BOWEL PROBLEMS (unusual diarrhea, constipation, pain near the anus) TENDERNESS IN MOUTH AND THROAT WITH OR WITHOUT PRESENCE OF ULCERS (sore throat, sores in mouth, or a toothache) UNUSUAL RASH, SWELLING OR PAIN  UNUSUAL VAGINAL DISCHARGE OR ITCHING   Items with * indicate a potential emergency and should be followed up as soon as possible or go to the Emergency Department if any problems should occur.  Please show the CHEMOTHERAPY ALERT CARD or IMMUNOTHERAPY  ALERT CARD at check-in to the Emergency Department and triage nurse.  Should you have questions after your visit or need to cancel or reschedule your appointment, please contact [facility]  Dept: [phone]  and follow the prompts.  Office hours are 8:00 a.m. to 4:30 p.m. Monday - Friday. Please note that voicemails left after 4:00 p.m. may not be returned until the following business day.  We are closed weekends and major holidays. You have access to a nurse at all times for urgent questions. Please call the main number to the clinic Dept: [phone] and follow the prompts.   For any non-urgent questions, you may also contact your provider using MyChart. We now offer e-Visits for anyone 24 and older to request care online for non-urgent symptoms. For details visit [email or web address].   Also download the MyChart app! Go to the app store, search "MyChart", open the app, select [facility], and log in with your MyChart username and password.  Masks are optional in the cancer centers. If you would like for your care team to wear a mask while they are taking care of you, please let them know. You may have one support person who is at least 72 years old accompany you for your appointments.

## 2022-10-08 ENCOUNTER — Ambulatory Visit: Payer: Medicare Other

## 2022-10-08 ENCOUNTER — Inpatient Hospital Stay: Payer: Medicare Other

## 2022-10-08 VITALS — BP 124/63 | HR 86 | Temp 98.6°F | Resp 18

## 2022-10-08 DIAGNOSIS — C3411 Malignant neoplasm of upper lobe, right bronchus or lung: Secondary | ICD-10-CM

## 2022-10-08 DIAGNOSIS — I10 Essential (primary) hypertension: Secondary | ICD-10-CM | POA: Diagnosis not present

## 2022-10-08 DIAGNOSIS — C787 Secondary malignant neoplasm of liver and intrahepatic bile duct: Secondary | ICD-10-CM | POA: Diagnosis not present

## 2022-10-08 DIAGNOSIS — Z5111 Encounter for antineoplastic chemotherapy: Secondary | ICD-10-CM | POA: Diagnosis not present

## 2022-10-08 DIAGNOSIS — E119 Type 2 diabetes mellitus without complications: Secondary | ICD-10-CM | POA: Diagnosis not present

## 2022-10-08 DIAGNOSIS — Z7901 Long term (current) use of anticoagulants: Secondary | ICD-10-CM | POA: Diagnosis not present

## 2022-10-08 MED ORDER — SODIUM CHLORIDE 0.9% FLUSH
10.0000 mL | INTRAVENOUS | Status: DC | PRN
  Administered 2022-10-08: 10 mL

## 2022-10-08 MED ORDER — TRILACICLIB DIHYDROCHLORIDE INJECTION 300 MG
240.0000 mg/m2 | Freq: Once | INTRAVENOUS | Status: AC
  Administered 2022-10-08: 600 mg via INTRAVENOUS
  Filled 2022-10-08: qty 40

## 2022-10-08 MED ORDER — SODIUM CHLORIDE 0.9 % IV SOLN
10.0000 mg | Freq: Once | INTRAVENOUS | Status: AC
  Administered 2022-10-08: 10 mg via INTRAVENOUS
  Filled 2022-10-08: qty 10

## 2022-10-08 MED ORDER — SODIUM CHLORIDE 0.9 % IV SOLN: Freq: Once | INTRAVENOUS | Status: AC

## 2022-10-08 MED ORDER — SODIUM CHLORIDE 0.9 % IV SOLN
80.0000 mg/m2 | Freq: Once | INTRAVENOUS | Status: AC
  Administered 2022-10-08: 210 mg via INTRAVENOUS
  Filled 2022-10-08: qty 10.5

## 2022-10-08 MED ORDER — HEPARIN SOD (PORK) LOCK FLUSH 100 UNIT/ML IV SOLN
500.0000 [IU] | Freq: Once | INTRAVENOUS | Status: AC | PRN
  Administered 2022-10-08: 500 [IU]

## 2022-10-08 MED FILL — Dexamethasone Sodium Phosphate Inj 100 MG/10ML: INTRAMUSCULAR | Qty: 1 | Status: AC

## 2022-10-08 NOTE — Patient Instructions (Signed)
[facility]COL[facility]  Discharge Instructions: Thank you for choosing [facility] to provide your oncology and hematology care.   If you have a lab appointment with the Cancer Center, please go directly to the Cancer Center and check in at the registration area.   Wear comfortable clothing and clothing appropriate for easy access to any Portacath or PICC line.   We strive to give you quality time with your provider. You may need to reschedule your appointment if you arrive late (15 or more minutes).  Arriving late affects you and other patients whose appointments are after yours.  Also, if you miss three or more appointments without notifying the office, you may be dismissed from the clinic at the provider's discretion.      For prescription refill requests, have your pharmacy contact our office and allow 72 hours for refills to be completed.    Today you received the following chemotherapy and/or immunotherapy agents; Cosela & Etoposide      To help prevent nausea and vomiting after your treatment, we encourage you to take your nausea medication as directed.  BELOW ARE SYMPTOMS THAT SHOULD BE REPORTED IMMEDIATELY: *FEVER GREATER THAN 100.4 F (38 C) OR HIGHER *CHILLS OR SWEATING *NAUSEA AND VOMITING THAT IS NOT CONTROLLED WITH YOUR NAUSEA MEDICATION *UNUSUAL SHORTNESS OF BREATH *UNUSUAL BRUISING OR BLEEDING *URINARY PROBLEMS (pain or burning when urinating, or frequent urination) *BOWEL PROBLEMS (unusual diarrhea, constipation, pain near the anus) TENDERNESS IN MOUTH AND THROAT WITH OR WITHOUT PRESENCE OF ULCERS (sore throat, sores in mouth, or a toothache) UNUSUAL RASH, SWELLING OR PAIN  UNUSUAL VAGINAL DISCHARGE OR ITCHING   Items with * indicate a potential emergency and should be followed up as soon as possible or go to the Emergency Department if any problems should occur.  Please show the CHEMOTHERAPY ALERT CARD or IMMUNOTHERAPY ALERT CARD at  check-in to the Emergency Department and triage nurse.  Should you have questions after your visit or need to cancel or reschedule your appointment, please contact [facility]  Dept: [phone]  and follow the prompts.  Office hours are 8:00 a.m. to 4:30 p.m. Monday - Friday. Please note that voicemails left after 4:00 p.m. may not be returned until the following business day.  We are closed weekends and major holidays. You have access to a nurse at all times for urgent questions. Please call the main number to the clinic Dept: [phone] and follow the prompts.   For any non-urgent questions, you may also contact your provider using MyChart. We now offer e-Visits for anyone 18 and older to request care online for non-urgent symptoms. For details visit [email or web address].   Also download the MyChart app! Go to the app store, search "MyChart", open the app, select [facility], and log in with your MyChart username and password.  Masks are optional in the cancer centers. If you would like for your care team to wear a mask while they are taking care of you, please let them know. You may have one support person who is at least 72 years old accompany you for your appointments. 

## 2022-10-09 ENCOUNTER — Inpatient Hospital Stay: Payer: Medicare Other

## 2022-10-09 ENCOUNTER — Encounter: Payer: Self-pay | Admitting: Internal Medicine

## 2022-10-09 VITALS — BP 132/74 | HR 84 | Temp 97.8°F | Resp 18

## 2022-10-09 DIAGNOSIS — C787 Secondary malignant neoplasm of liver and intrahepatic bile duct: Secondary | ICD-10-CM | POA: Diagnosis not present

## 2022-10-09 DIAGNOSIS — Z7901 Long term (current) use of anticoagulants: Secondary | ICD-10-CM | POA: Diagnosis not present

## 2022-10-09 DIAGNOSIS — E119 Type 2 diabetes mellitus without complications: Secondary | ICD-10-CM | POA: Diagnosis not present

## 2022-10-09 DIAGNOSIS — Z5111 Encounter for antineoplastic chemotherapy: Secondary | ICD-10-CM | POA: Diagnosis not present

## 2022-10-09 DIAGNOSIS — C3411 Malignant neoplasm of upper lobe, right bronchus or lung: Secondary | ICD-10-CM

## 2022-10-09 DIAGNOSIS — I10 Essential (primary) hypertension: Secondary | ICD-10-CM | POA: Diagnosis not present

## 2022-10-09 MED ORDER — HEPARIN SOD (PORK) LOCK FLUSH 100 UNIT/ML IV SOLN
500.0000 [IU] | Freq: Once | INTRAVENOUS | Status: AC | PRN
  Administered 2022-10-09: 500 [IU]

## 2022-10-09 MED ORDER — SODIUM CHLORIDE 0.9 % IV SOLN
10.0000 mg | Freq: Once | INTRAVENOUS | Status: AC
  Administered 2022-10-09: 10 mg via INTRAVENOUS
  Filled 2022-10-09: qty 10

## 2022-10-09 MED ORDER — SODIUM CHLORIDE 0.9 % IV SOLN
80.0000 mg/m2 | Freq: Once | INTRAVENOUS | Status: AC
  Administered 2022-10-09: 210 mg via INTRAVENOUS
  Filled 2022-10-09: qty 10.5

## 2022-10-09 MED ORDER — TRILACICLIB DIHYDROCHLORIDE INJECTION 300 MG
240.0000 mg/m2 | Freq: Once | INTRAVENOUS | Status: AC
  Administered 2022-10-09: 600 mg via INTRAVENOUS
  Filled 2022-10-09: qty 40

## 2022-10-09 MED ORDER — SODIUM CHLORIDE 0.9 % IV SOLN: Freq: Once | INTRAVENOUS | Status: AC

## 2022-10-09 MED ORDER — SODIUM CHLORIDE 0.9% FLUSH
10.0000 mL | INTRAVENOUS | Status: DC | PRN
  Administered 2022-10-09: 10 mL

## 2022-10-09 NOTE — Patient Instructions (Signed)
[facility]  Discharge Instructions: Thank you for choosing [facility] to provide your oncology and hematology care.   If you have a lab appointment with the [facility], please go directly to the [facility] and check in at the registration area.   Wear comfortable clothing and clothing appropriate for easy access to any Portacath or PICC line.   We strive to give you quality time with your provider. You may need to reschedule your appointment if you arrive late (15 or more minutes).  Arriving late affects you and other patients whose appointments are after yours.  Also, if you miss three or more appointments without notifying the office, you may be dismissed from the clinic at the provider's discretion.      For prescription refill requests, have your pharmacy contact our office and allow 72 hours for refills to be completed.    Today you received the following chemotherapy and/or immunotherapy agents: Cosela, Etoposide.   To help prevent nausea and vomiting after your treatment, we encourage you to take your nausea medication as directed.  BELOW ARE SYMPTOMS THAT SHOULD BE REPORTED IMMEDIATELY: *FEVER GREATER THAN 100.4 F (38 C) OR HIGHER *CHILLS OR SWEATING *NAUSEA AND VOMITING THAT IS NOT CONTROLLED WITH YOUR NAUSEA MEDICATION *UNUSUAL SHORTNESS OF BREATH *UNUSUAL BRUISING OR BLEEDING *URINARY PROBLEMS (pain or burning when urinating, or frequent urination) *BOWEL PROBLEMS (unusual diarrhea, constipation, pain near the anus) TENDERNESS IN MOUTH AND THROAT WITH OR WITHOUT PRESENCE OF ULCERS (sore throat, sores in mouth, or a toothache) UNUSUAL RASH, SWELLING OR PAIN  UNUSUAL VAGINAL DISCHARGE OR ITCHING   Items with * indicate a potential emergency and should be followed up as soon as possible or go to the Emergency Department if any problems should occur.  Please show the CHEMOTHERAPY ALERT CARD or IMMUNOTHERAPY ALERT CARD at  check-in to the Emergency Department and triage nurse.  Should you have questions after your visit or need to cancel or reschedule your appointment, please contact [facility]  Dept: [phone]  and follow the prompts.  Office hours are 8:00 a.m. to 4:30 p.m. Monday - Friday. Please note that voicemails left after 4:00 p.m. may not be returned until the following business day.  We are closed weekends and major holidays. You have access to a nurse at all times for urgent questions. Please call the main number to the clinic Dept: [phone] and follow the prompts.   For any non-urgent questions, you may also contact your provider using MyChart. We now offer e-Visits for anyone 1 and older to request care online for non-urgent symptoms. For details visit [email or web address].   Also download the MyChart app! Go to the app store, search "MyChart", open the app, select [facility], and log in with your MyChart username and password.  Masks are optional in the cancer centers. If you would like for your care team to wear a mask while they are taking care of you, please let them know. You may have one support person who is at least 72 years old accompany you for your appointments.

## 2022-10-10 ENCOUNTER — Telehealth: Payer: Self-pay

## 2022-10-10 NOTE — Telephone Encounter (Signed)
Spoke with pt's wife to let her know that her my-chart message with scheduling concerns had been forwarded to [employer]. Mrs. Palladino will call office on 10/15/22 if there are still questions.

## 2022-10-13 ENCOUNTER — Encounter: Payer: Self-pay | Admitting: Internal Medicine

## 2022-10-14 ENCOUNTER — Other Ambulatory Visit: Payer: Self-pay | Admitting: Internal Medicine

## 2022-10-14 MED ORDER — HYDROCOD POLI-CHLORPHE POLI ER 10-8 MG/5ML PO SUER: 5.0000 mL | Freq: Two times a day (BID) | ORAL | 0 refills | Status: DC | PRN

## 2022-10-15 ENCOUNTER — Inpatient Hospital Stay: Payer: Medicare Other | Attending: Internal Medicine

## 2022-10-15 DIAGNOSIS — C3411 Malignant neoplasm of upper lobe, right bronchus or lung: Secondary | ICD-10-CM | POA: Diagnosis present

## 2022-10-15 DIAGNOSIS — C787 Secondary malignant neoplasm of liver and intrahepatic bile duct: Secondary | ICD-10-CM | POA: Diagnosis not present

## 2022-10-15 DIAGNOSIS — Z5111 Encounter for antineoplastic chemotherapy: Secondary | ICD-10-CM | POA: Diagnosis not present

## 2022-10-15 DIAGNOSIS — J90 Pleural effusion, not elsewhere classified: Secondary | ICD-10-CM | POA: Diagnosis not present

## 2022-10-15 DIAGNOSIS — D649 Anemia, unspecified: Secondary | ICD-10-CM

## 2022-10-15 LAB — CBC WITH DIFFERENTIAL (CANCER CENTER ONLY)
Abs Immature Granulocytes: 0.04 10*3/uL (ref 0.00–0.07)
Basophils Absolute: 0 10*3/uL (ref 0.0–0.1)
Basophils Relative: 1 %
Eosinophils Absolute: 0 10*3/uL (ref 0.0–0.5)
Eosinophils Relative: 1 %
HCT: 28.2 % — ABNORMAL LOW (ref 39.0–52.0)
Hemoglobin: 9.1 g/dL — ABNORMAL LOW (ref 13.0–17.0)
Immature Granulocytes: 1 %
Lymphocytes Relative: 10 %
Lymphs Abs: 0.4 10*3/uL — ABNORMAL LOW (ref 0.7–4.0)
MCH: 30.3 pg (ref 26.0–34.0)
MCHC: 32.3 g/dL (ref 30.0–36.0)
MCV: 94 fL (ref 80.0–100.0)
Monocytes Absolute: 0.2 10*3/uL (ref 0.1–1.0)
Monocytes Relative: 5 %
Neutro Abs: 3.5 10*3/uL (ref 1.7–7.7)
Neutrophils Relative %: 82 %
Platelet Count: 134 10*3/uL — ABNORMAL LOW (ref 150–400)
RBC: 3 MIL/uL — ABNORMAL LOW (ref 4.22–5.81)
RDW: 15.7 % — ABNORMAL HIGH (ref 11.5–15.5)
WBC Count: 4.3 10*3/uL (ref 4.0–10.5)
nRBC: 0 % (ref 0.0–0.2)

## 2022-10-15 LAB — SAMPLE TO BLOOD BANK

## 2022-10-16 ENCOUNTER — Encounter: Payer: Self-pay | Admitting: Pulmonary Disease

## 2022-10-16 ENCOUNTER — Ambulatory Visit (INDEPENDENT_AMBULATORY_CARE_PROVIDER_SITE_OTHER): Payer: Medicare Other | Admitting: Pulmonary Disease

## 2022-10-16 VITALS — BP 140/70 | HR 86 | Ht 68.0 in | Wt 296.8 lb

## 2022-10-16 DIAGNOSIS — G4733 Obstructive sleep apnea (adult) (pediatric): Secondary | ICD-10-CM | POA: Diagnosis not present

## 2022-10-16 DIAGNOSIS — J90 Pleural effusion, not elsewhere classified: Secondary | ICD-10-CM

## 2022-10-16 DIAGNOSIS — J9611 Chronic respiratory failure with hypoxia: Secondary | ICD-10-CM | POA: Diagnosis not present

## 2022-10-16 DIAGNOSIS — C3411 Malignant neoplasm of upper lobe, right bronchus or lung: Secondary | ICD-10-CM | POA: Diagnosis not present

## 2022-10-16 NOTE — Progress Notes (Signed)
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  Patient was completed palliative radiotherapy and systemic chemotherapy followed by Dr. Earlie Server.  Patient unfortunately has had a recurrent right-sided effusion.  He has had 6 thoracentesis in the past.  Patient was referred today to discuss possibility of Pleurx catheter placement or consideration for other options of recurrent pleural effusion.  He has a longtime history of tobacco use.  Currently using albuterol only.  Was placed on Anoro in the past with no significant improvement.  When you know if there is other inhaler options.  OV 01/16/2022: Patient was seen today for follow-up after recent surgical consultation.  Dr. Kipp Brood felt as if risk for thoracotomy were too high and concern for BPF if operating on him.  Therefore we are here today to discuss pigtail placement for talc pleurodesis versus a indwelling pleural catheter placement.  Patient would like to proceed with talc pleurodesis.  We talked about the risk benefits of both today in the office.  He does have increasing shortness of breath.  Ultrasound completed also reveals persistent right-sided fluid accumulation today in the office.  He also had a two-view chest x-ray confirming this.  We reviewed this today in the office as well.  OV 02/19/2022: Here today for follow-up after recent talc pleurodesis.  Patient did well with the pigtail catheter placement drainage and talc pleurodesis.  He was discharged home.  From respiratory  standpoint doing okay.  He did have a recent CT scan of the chest on 02/06/2022 which revealed small amount of reaccumulation of fluid in the right chest.  However he does feel a lot better does not have the shortness of breath that he had in the past.  OV 04/24/2022: Seen today for follow-up of pleural effusion.  He did really well after his first talc pleurodesis.  Showed significant improvement in his pleural fluid collection on the right side.  He had a recent CT scan of the chest that does however show reaccumulation.  He would like to have this redone and we talked about the need for consider tube placement and drying out the right chest completely before undergoing a repeat talc pleurodesis.  He is on Eliquis and will need to stop this.  OV 07/23/2022: Here today for follow-up.  From a respiratory standpoint he does feel little bit more short of breath.  He does have wheezing in the morning at times.  Uses his albuterol as needed.  Had recent CT imaging of the chest that shows shows persistence of this right-sided pleural effusion with evidence of previous talc pleurodesis.  Or at least an attempt of talc pleurodesis.  He has enlarging lesions in the liver that made the decision with medical oncology to restart carboplatinum plus etoposide for his treatments.  OV 10/16/2022: Here today for follow-up.  Had another talc pleurodesis.  Felt better after this.  Had some issues during his brief hospitalization but otherwise he did fine.  Repeat CT of the chest recently shows persistent small amount of fluid in the right chest.  Has  evidence of pleural thickening and talc layering.  Otherwise he feels okay.  Currently still undergoing treatments with carboplatin and etoposide by Dr. Earlie Server.    Past Medical History:  Diagnosis Date   Chickenpox    Chronic knee pain    Chronic low back pain    COPD exacerbation ([place]) 10/11/2021   COPD with exacerbation ([place]) 10/12/2021   Coronary artery calcification  seen on CAT scan 11/2021   Coronary CTA 11/27/2021: Moderate to large right pleural effusion and compressive atelectasis right lung base. => Coronary Calcium Score 657.  Diffuse RCA calcification.  Minimal mild disease in the LAD and diagonal branches. == Overall limited study.  Notable artifact.   Essential hypertension    GERD (gastroesophageal reflux disease)    Hyperlipidemia    Malignant neoplasm of upper lobe of right lung ([place]) 04/04/2020   OSA (obstructive sleep apnea)    With nighttime oxygen supplementation   T4, M3, M1 C Metastatic Small Cell Lung Cancer 03/2020   large right upper lobe/right hilar mass with ipsilateral and contralateral mediastinal and right supraclavicular lymphadenopathy in addition to multiple liver lesios. He has disease progression after the first line of chemotherapy in December 2021.   Testosterone deficiency    Type 2 diabetes mellitus ([place])      Family History  Problem Relation Age of Onset   Heart attack Mother    Cancer Mother    Hypertension Mother    Arthritis Father    Asthma Father    Cancer Father    COPD Father    Heart attack Father    Hypertension Sister    Cancer Sister    Diabetes Sister    Asthma Sister    Cancer Sister    COPD Sister    Arthritis Sister    Asthma Sister    Diabetes Sister    Birth defects Maternal Grandfather    Arthritis Paternal Grandmother    Diabetes Paternal Grandmother    Arthritis Paternal Grandfather    Asthma Son    Other Neg Hx        pituitary disorder     Past Surgical History:  Procedure Laterality Date   CHEST TUBE INSERTION Right 01/20/2022   Procedure: CHEST TUBE INSERTION;  Surgeon: Candee Furbish, MD;  Location: [facility] ENDOSCOPY;  Service: Pulmonary;  Laterality: Right;  w/ Talc Pleurodesis, planned admit for Obs afterwards   CHEST TUBE INSERTION Right 04/28/2022   Procedure: INSERTION PLEURAL DRAINAGE CATHETER - Pigtail tail, drainage;  Surgeon: Candee Furbish, MD;  Location: [facility] ENDOSCOPY;   Service: Pulmonary;  Laterality: Right;  talc pleurodesis   CHEST TUBE INSERTION Right 09/01/2022   Procedure: INSERTION PLEURAL DRAINAGE CATHETER;  Surgeon: Juanito Doom, MD;  Location: [place];  Service: Cardiopulmonary;  Laterality: Right;  pigtail catheter placement with talc pleurodesis   COLONOSCOPY WITH PROPOFOL N/A 12/17/2018   Procedure: COLONOSCOPY WITH PROPOFOL;  Surgeon: Virgel Manifold, MD;  Location: [facility] ENDOSCOPY;  Service: Gastroenterology;  Laterality: N/A;   IR IMAGING GUIDED PORT INSERTION  04/17/2020   IR THORACENTESIS ASP PLEURAL SPACE W/IMG GUIDE  11/29/2021   IR THORACENTESIS ASP PLEURAL SPACE W/IMG GUIDE  01/03/2022   JOINT REPLACEMENT Bilateral    REPLACEMENT TOTAL KNEE BILATERAL  2015   TALC PLEURODESIS  09/01/2022   Procedure: Pietro Cassis;  Surgeon: Juanito Doom, MD;  Location: [facility] ENDOSCOPY;  Service: Cardiopulmonary;;   [place] ECHOCARDIOGRAM  05/2020   a) 05/2020: EF  55 to 60%.  No R WMA.  Mild LVH.  Indeterminate LVEDP.  Unable to assess RVP.  Normal aortic and mitral valves.  Mildly elevated RAP.; b) 09/2021: EF 50-55%. No RWMA. Mild LVH. ~ LVEDP. Mild LA Dilation. NORMAL RV/RAP.  Normal MV/AoV.    Social History   Socioeconomic History   Marital status: Married    Spouse name: Not on file   Number of children: Not on file   Years of education: Not on file   Highest education level: Not on file  Occupational History   Not on file  Tobacco Use   Smoking status: Former   Smokeless tobacco: Never  Vaping Use   Vaping Use: Never used  Substance and Sexual Activity   Alcohol use: Not Currently    Comment: rarely   Drug use: Not Currently   Sexual activity: Yes  Other Topics Concern   Not on file  Social History Narrative   Married.   Moved from [place].   Retired.   Social Determinants of Health   Financial Resource Strain: Low Risk  (02/14/2022)   Overall Financial Resource Strain (CARDIA)     Difficulty of Paying Living Expenses: Not hard at all  Food Insecurity: No Food Insecurity (02/14/2022)   Hunger Vital Sign    Worried About Running Out of Food in the Last Year: Never true    Ran Out of Food in the Last Year: Never true  Transportation Needs: No Transportation Needs (02/14/2022)   PRAPARE - Hydrologist (Medical): No    Lack of Transportation (Non-Medical): No  Physical Activity: Inactive (02/14/2022)   Exercise Vital Sign    Days of Exercise per Week: 0 days    Minutes of Exercise per Session: 0 min  Stress: No Stress Concern Present (02/14/2022)   [place]    Feeling of Stress : Only a little  Social Connections: Moderately Integrated (02/14/2022)   Social Connection and Isolation Panel [NHANES]    Frequency of Communication with Friends and Family: Twice a week    Frequency of Social Gatherings with Friends and Family: Twice a week    Attends Religious Services: 1 to 4 times per year    Active Member of [employer] or Organizations: No    Attends Archivist Meetings: Never    Marital Status: Married  Human resources officer Violence: Not At Risk (02/14/2022)   Humiliation, Afraid, Rape, and Kick questionnaire    Fear of Current or Ex-Partner: No    Emotionally Abused: No    Physically Abused: No    Sexually Abused: No     Allergies  Allergen Reactions   Bupropion     Racing heart   Hydrochlorothiazide Other (See Comments)    Cramping to lower extremities     Outpatient Medications Prior to Visit  Medication Sig Dispense Refill   albuterol (VENTOLIN HFA) 108 (90 Base) MCG/ACT inhaler Inhale 2 puffs into the lungs every 6 (six) hours as needed for wheezing or shortness of breath. 18 g 0   Budeson-Glycopyrrol-Formoterol (BREZTRI AEROSPHERE) 160-9-4.8 MCG/ACT AERO Inhale 2 puffs into the lungs in the morning and at bedtime. 10.7 g 3   chlorpheniramine-HYDROcodone (TUSSIONEX)  10-8 MG/5ML Take 5 mLs by mouth every 12 (twelve) hours as needed for cough. 473 mL 0   ipratropium-albuterol (DUONEB) 0.5-2.5 (3) MG/3ML SOLN Take 3 mLs by nebulization every 4 (four) hours while awake for 3 days, THEN 3  mLs every 4 (four) hours as needed (shortness of breath or wheezing). 360 mL 0   amLODipine (NORVASC) 10 MG tablet TAKE 1 TABLET BY MOUTH EVERY DAY FOR BLOOD PRESSURE 90 tablet 3   B-D 3CC LUER-LOK SYR 22GX1" 22G X 1" 3 ML MISC USE AS INSTRUCTED FOR TESTOSTERONE INJECTION EVERY 2 WEEKS 10 each 2   bisoprolol (ZEBETA) 5 MG tablet TAKE 1 TABLET (5 MG TOTAL) BY MOUTH DAILY. 90 tablet 2   Black Pepper-Turmeric (TURMERIC COMPLEX/BLACK PEPPER PO) Take 1 tablet by mouth in the morning and at bedtime.     blood glucose meter kit and supplies KIT Dispense based on patient and insurance preference. Use up to four times daily as directed. 1 each 0   Coenzyme Q10 (COQ10) 200 MG CAPS Take 200 mg by mouth at bedtime.     ELIQUIS 5 MG TABS tablet TAKE 1 TABLET BY MOUTH TWICE A DAY 180 tablet 2   esomeprazole (NEXIUM) 20 MG capsule Take 20 mg by mouth in the morning.     gabapentin (NEURONTIN) 300 MG capsule TAKE 1 CAPSULE (300 MG TOTAL) BY MOUTH 2 (TWO) TIMES DAILY. FOR BACK PAIN. 180 capsule 2   glipiZIDE (GLUCOTROL) 10 MG tablet TAKE 1 TABLET (10 MG TOTAL) BY MOUTH 2 (TWO) TIMES DAILY BEFORE A MEAL. FOR DIABETES. 180 tablet 0   glucose blood (ONETOUCH ULTRA) test strip USE UP TO 4 TIMES DAILY AS DIRECTED 400 strip 5   Insulin Glargine (BASAGLAR KWIKPEN) 100 UNIT/ML Inject 24 Units into the skin at bedtime. For diabetes. 30 mL 1   Insulin Pen Needle (BD PEN NEEDLE NANO U/F) 32G X 4 MM MISC Use with insulin as prescribed Dx Code: E11.9 200 each 3   KRILL OIL PO Take 1 capsule by mouth daily.     Lancets MISC USE UP TO 3 TIMES DAILY AS DIRECTED 100 each 2   losartan (COZAAR) 50 MG tablet TAKE 1 TABLET (50 MG TOTAL) BY MOUTH DAILY. FOR BLOOD PRESSURE. 90 tablet 2   Melatonin 10 MG CAPS Take 10 mg  by mouth at bedtime.     metFORMIN (GLUCOPHAGE) 1000 MG tablet TAKE 1 TABLET (1,000 MG TOTAL) BY MOUTH 2 (TWO) TIMES DAILY WITH A MEAL. FOR DIABETES 180 tablet 0   Multiple Vitamin (MULTI-VITAMINS) TABS Take 1 tablet by mouth in the morning.     oxymetazoline (AFRIN) 0.05 % nasal spray Place 1 spray into both nostrils daily as needed for congestion.     pioglitazone (ACTOS) 45 MG tablet TAKE 1 TABLET (45 MG TOTAL) BY MOUTH DAILY. FOR DIABETES. 90 tablet 0   pravastatin (PRAVACHOL) 40 MG tablet TAKE 1 TABLET BY MOUTH EVERY DAY IN THE EVENING FOR CHOLESTEROL 90 tablet 2   testosterone cypionate (DEPOTESTOSTERONE CYPIONATE) 200 MG/ML injection Inject 200 mg into the muscle every 14 (fourteen) days.     traMADol (ULTRAM) 50 MG tablet Take 1 tablet (50 mg total) by mouth every 6 (six) hours as needed. (Patient not taking: Reported on 09/24/2022) 15 tablet 0   No facility-administered medications prior to visit.    Review of Systems  Constitutional:  Negative for chills, fever, malaise/fatigue and weight loss.  HENT:  Negative for hearing loss, sore throat and tinnitus.   Eyes:  Negative for blurred vision and double vision.  Respiratory:  Negative for cough, hemoptysis, sputum production, shortness of breath, wheezing and stridor.   Cardiovascular:  Negative for chest pain, palpitations, orthopnea, leg swelling and PND.  Gastrointestinal:  Negative for abdominal pain, constipation, diarrhea, heartburn, nausea and vomiting.  Genitourinary:  Negative for dysuria, hematuria and urgency.  Musculoskeletal:  Negative for joint pain and myalgias.  Skin:  Negative for itching and rash.  Neurological:  Negative for dizziness, tingling, weakness and headaches.  Endo/Heme/Allergies:  Negative for environmental allergies. Does not bruise/bleed easily.  Psychiatric/Behavioral:  Negative for depression. The patient is not nervous/anxious and does not have insomnia.   All other systems reviewed and are  negative.    Objective:  Physical Exam Vitals reviewed.  Constitutional:      General: He is not in acute distress.    Appearance: He is well-developed. He is obese.  HENT:     Head: Normocephalic and atraumatic.  Eyes:     General: No scleral icterus.    Conjunctiva/sclera: Conjunctivae normal.     Pupils: Pupils are equal, round, and reactive to light.  Neck:     Vascular: No JVD.     Trachea: No tracheal deviation.  Cardiovascular:     Rate and Rhythm: Normal rate and regular rhythm.     Heart sounds: Normal heart sounds. No murmur heard. Pulmonary:     Effort: Pulmonary effort is normal. No tachypnea, accessory muscle usage or respiratory distress.     Breath sounds: No stridor. No wheezing, rhonchi or rales.     Comments: Diminished breath sounds in the bilateral bases. Abdominal:     General: There is no distension.     Palpations: Abdomen is soft.     Tenderness: There is no abdominal tenderness.  Musculoskeletal:        General: No tenderness.     Cervical back: Neck supple.  Lymphadenopathy:     Cervical: No cervical adenopathy.  Skin:    General: Skin is warm and dry.     Capillary Refill: Capillary refill takes less than 2 seconds.     Findings: No rash.  Neurological:     Mental Status: He is alert and oriented to person, place, and time.  Psychiatric:        Behavior: Behavior normal.      Vitals:   10/16/22 1031  BP: (!) 140/70  Pulse: 86  SpO2: 96%  Weight: 296 lb 12.8 oz (134.6 kg)  Height: 5' 8" (1.727 m)   96% on RA BMI Readings from Last 3 Encounters:  10/16/22 45.13 kg/m  10/07/22 42.87 kg/m  10/02/22 43.01 kg/m   Wt Readings from Last 3 Encounters:  10/16/22 296 lb 12.8 oz (134.6 kg)  10/07/22 294 lb 8 oz (133.6 kg)  10/02/22 295 lb 8 oz (134 kg)     CBC    Component Value Date/Time   WBC 4.3 10/15/2022 0903   WBC 2.5 (L) 10/18/2021 1533   RBC 3.00 (L) 10/15/2022 0903   HGB 9.1 (L) 10/15/2022 0903   HCT 28.2 (L)  10/15/2022 0903   HCT 42.7 10/06/2017 1052   PLT 134 (L) 10/15/2022 0903   MCV 94.0 10/15/2022 0903   MCH 30.3 10/15/2022 0903   MCHC 32.3 10/15/2022 0903   RDW 15.7 (H) 10/15/2022 0903   LYMPHSABS 0.4 (L) 10/15/2022 0903   MONOABS 0.2 10/15/2022 0903   EOSABS 0.0 10/15/2022 0903   BASOSABS 0.0 10/15/2022 0903     Chest Imaging: Chest x-ray 11/29/2021: No significant effusion on the right following a thoracentesis, no pneumothorax.  Some scarring in the right base, density in the medial portion of the right upper lobe history of small cell with  radiation. The patient's images have been independently reviewed by me.    CT Chest Feb 2022: CT scan of the chest reveals some pleural studding as well as small pleural effusion on the right side. The patient's images have been independently reviewed by me.    CT chest 04/17/2022: Accumulation of right pleural fluid. The patient's images have been independently reviewed by me.    CT chest 07/04/2022: Small 11 x 12 mm nodular area in the right apex.  Increased right-sided pleural effusion.  Diminished size of hepatic metastasis. The patient's images have been independently reviewed by me.    Pulmonary Functions Testing Results:     No data to display          FeNO:   Pathology:   Echocardiogram:   Heart Catheterization:     Assessment & Plan:     ICD-10-CM   1. Recurrent right pleural effusion  J90     2. Small cell lung cancer, right upper lobe (HCC)  C34.11     3. Chronic respiratory failure with hypoxia (HCC)  J96.11     4. OSA (obstructive sleep apnea)  G47.33       Discussion:  This is a 71 year old gentleman, history of COPD, stage IV small cell carcinoma recurrent right-sided pleural effusions has underwent several pigtail catheter placements and attempts at right-sided talc pleurodesis.  Unfortunately has had recurrence each time.  However this time he has really small amount of fluid accumulation and some  evidence of talc layering and pleural thickening.  His breathlessness is better and he has no real significant shortness of breath.  Looks like this is held a little bit longer this time.  Plan: Continue Breztri for his COPD He is on a let us know if he has any changes in his symptoms. If his respiratory symptoms get worse or he feels like the fluid reaccumulation he is can reach out let us know. Otherwise continue follow-up with medical oncology as scheduled.  He can see [place] as needed.    Current Outpatient Medications:    albuterol (VENTOLIN HFA) 108 (90 Base) MCG/ACT inhaler, Inhale 2 puffs into the lungs every 6 (six) hours as needed for wheezing or shortness of breath., Disp: 18 g, Rfl: 0   Budeson-Glycopyrrol-Formoterol (BREZTRI AEROSPHERE) 160-9-4.8 MCG/ACT AERO, Inhale 2 puffs into the lungs in the morning and at bedtime., Disp: 10.7 g, Rfl: 3   chlorpheniramine-HYDROcodone (TUSSIONEX) 10-8 MG/5ML, Take 5 mLs by mouth every 12 (twelve) hours as needed for cough., Disp: 473 mL, Rfl: 0   ipratropium-albuterol (DUONEB) 0.5-2.5 (3) MG/3ML SOLN, Take 3 mLs by nebulization every 4 (four) hours while awake for 3 days, THEN 3 mLs every 4 (four) hours as needed (shortness of breath or wheezing)., Disp: 360 mL, Rfl: 0   amLODipine (NORVASC) 10 MG tablet, TAKE 1 TABLET BY MOUTH EVERY DAY FOR BLOOD PRESSURE, Disp: 90 tablet, Rfl: 3   B-D 3CC LUER-LOK SYR 22GX1" 22G X 1" 3 ML MISC, USE AS INSTRUCTED FOR TESTOSTERONE INJECTION EVERY 2 WEEKS, Disp: 10 each, Rfl: 2   bisoprolol (ZEBETA) 5 MG tablet, TAKE 1 TABLET (5 MG TOTAL) BY MOUTH DAILY., Disp: 90 tablet, Rfl: 2   Black Pepper-Turmeric (TURMERIC COMPLEX/BLACK PEPPER PO), Take 1 tablet by mouth in the morning and at bedtime., Disp: , Rfl:    blood glucose meter kit and supplies KIT, Dispense based on patient and insurance preference. Use up to four times daily as directed., Disp: 1 each, Rfl: 0  Coenzyme Q10 (COQ10) 200 MG CAPS, Take 200 mg by mouth  at bedtime., Disp: , Rfl:    ELIQUIS 5 MG TABS tablet, TAKE 1 TABLET BY MOUTH TWICE A DAY, Disp: 180 tablet, Rfl: 2   esomeprazole (NEXIUM) 20 MG capsule, Take 20 mg by mouth in the morning., Disp: , Rfl:    gabapentin (NEURONTIN) 300 MG capsule, TAKE 1 CAPSULE (300 MG TOTAL) BY MOUTH 2 (TWO) TIMES DAILY. FOR BACK PAIN., Disp: 180 capsule, Rfl: 2   glipiZIDE (GLUCOTROL) 10 MG tablet, TAKE 1 TABLET (10 MG TOTAL) BY MOUTH 2 (TWO) TIMES DAILY BEFORE A MEAL. FOR DIABETES., Disp: 180 tablet, Rfl: 0   glucose blood (ONETOUCH ULTRA) test strip, USE UP TO 4 TIMES DAILY AS DIRECTED, Disp: 400 strip, Rfl: 5   Insulin Glargine (BASAGLAR KWIKPEN) 100 UNIT/ML, Inject 24 Units into the skin at bedtime. For diabetes., Disp: 30 mL, Rfl: 1   Insulin Pen Needle (BD PEN NEEDLE NANO U/F) 32G X 4 MM MISC, Use with insulin as prescribed Dx Code: E11.9, Disp: 200 each, Rfl: 3   KRILL OIL PO, Take 1 capsule by mouth daily., Disp: , Rfl:    Lancets MISC, USE UP TO 3 TIMES DAILY AS DIRECTED, Disp: 100 each, Rfl: 2   losartan (COZAAR) 50 MG tablet, TAKE 1 TABLET (50 MG TOTAL) BY MOUTH DAILY. FOR BLOOD PRESSURE., Disp: 90 tablet, Rfl: 2   Melatonin 10 MG CAPS, Take 10 mg by mouth at bedtime., Disp: , Rfl:    metFORMIN (GLUCOPHAGE) 1000 MG tablet, TAKE 1 TABLET (1,000 MG TOTAL) BY MOUTH 2 (TWO) TIMES DAILY WITH A MEAL. FOR DIABETES, Disp: 180 tablet, Rfl: 0   Multiple Vitamin (MULTI-VITAMINS) TABS, Take 1 tablet by mouth in the morning., Disp: , Rfl:    oxymetazoline (AFRIN) 0.05 % nasal spray, Place 1 spray into both nostrils daily as needed for congestion., Disp: , Rfl:    pioglitazone (ACTOS) 45 MG tablet, TAKE 1 TABLET (45 MG TOTAL) BY MOUTH DAILY. FOR DIABETES., Disp: 90 tablet, Rfl: 0   pravastatin (PRAVACHOL) 40 MG tablet, TAKE 1 TABLET BY MOUTH EVERY DAY IN THE EVENING FOR CHOLESTEROL, Disp: 90 tablet, Rfl: 2   testosterone cypionate (DEPOTESTOSTERONE CYPIONATE) 200 MG/ML injection, Inject 200 mg into the muscle every  14 (fourteen) days., Disp: , Rfl:    traMADol (ULTRAM) 50 MG tablet, Take 1 tablet (50 mg total) by mouth every 6 (six) hours as needed. (Patient not taking: Reported on 09/24/2022), Disp: 15 tablet, Rfl: 0   Garner Nash, DO [facility] 10/16/2022 10:53 AM

## 2022-10-16 NOTE — Patient Instructions (Signed)
Thank you for visiting Dr. Valeta Harms at [facility]. Today we recommend the following:  Call me if your symptoms change or breathing changes  We are glad to see you anytime.   Return if symptoms worsen or fail to improve.    Please do your part to reduce the spread of COVID-19.

## 2022-10-22 ENCOUNTER — Inpatient Hospital Stay: Payer: Medicare Other

## 2022-10-22 DIAGNOSIS — J90 Pleural effusion, not elsewhere classified: Secondary | ICD-10-CM | POA: Diagnosis not present

## 2022-10-22 DIAGNOSIS — C3411 Malignant neoplasm of upper lobe, right bronchus or lung: Secondary | ICD-10-CM | POA: Diagnosis not present

## 2022-10-22 DIAGNOSIS — Z5111 Encounter for antineoplastic chemotherapy: Secondary | ICD-10-CM | POA: Diagnosis not present

## 2022-10-22 DIAGNOSIS — C787 Secondary malignant neoplasm of liver and intrahepatic bile duct: Secondary | ICD-10-CM | POA: Diagnosis not present

## 2022-10-22 LAB — CBC WITH DIFFERENTIAL (CANCER CENTER ONLY)
Abs Immature Granulocytes: 0.02 10*3/uL (ref 0.00–0.07)
Basophils Absolute: 0 10*3/uL (ref 0.0–0.1)
Basophils Relative: 1 %
Eosinophils Absolute: 0.1 10*3/uL (ref 0.0–0.5)
Eosinophils Relative: 2 %
HCT: 27.5 % — ABNORMAL LOW (ref 39.0–52.0)
Hemoglobin: 8.7 g/dL — ABNORMAL LOW (ref 13.0–17.0)
Immature Granulocytes: 1 %
Lymphocytes Relative: 18 %
Lymphs Abs: 0.4 10*3/uL — ABNORMAL LOW (ref 0.7–4.0)
MCH: 30.3 pg (ref 26.0–34.0)
MCHC: 31.6 g/dL (ref 30.0–36.0)
MCV: 95.8 fL (ref 80.0–100.0)
Monocytes Absolute: 0.4 10*3/uL (ref 0.1–1.0)
Monocytes Relative: 18 %
Neutro Abs: 1.5 10*3/uL — ABNORMAL LOW (ref 1.7–7.7)
Neutrophils Relative %: 60 %
Platelet Count: 81 10*3/uL — ABNORMAL LOW (ref 150–400)
RBC: 2.87 MIL/uL — ABNORMAL LOW (ref 4.22–5.81)
RDW: 17.1 % — ABNORMAL HIGH (ref 11.5–15.5)
WBC Count: 2.5 10*3/uL — ABNORMAL LOW (ref 4.0–10.5)
nRBC: 0 % (ref 0.0–0.2)

## 2022-10-22 LAB — SAMPLE TO BLOOD BANK

## 2022-10-24 NOTE — Progress Notes (Unsigned)
[facility] OFFICE PROGRESS NOTE  [name], NP [place] [address]  DIAGNOSIS:  Relapsed extensive stage (T4, N3, M1c)  small cell lung cancer diagnosed in April 2021 and presented with large right upper lobe/right hilar mass with ipsilateral and contralateral mediastinal and right supraclavicular lymphadenopathy in addition to multiple liver lesions. He has disease progression after the first line of chemotherapy in December 2021.   PRIOR THERAPY: 1) Palliative radiotherapy to the right upper lobe lung mass under the care of Dr. Lisbeth Renshaw. 2) Systemic chemotherapy with carboplatin for AUC of 5 on day 1, etoposide 100 mg/M2 on days 1, 2 and 3 in addition to Imfinzi 1500 mg IV every 3 weeks with chemotherapy then every 4 weeks for maintenance if the patient has no evidence for progression.  He will also receive Cosela 240 mg/m2 on the days of the chemotherapy.  Status post 9 cycles.  Starting from cycle #5 the patient will be on maintenance treatment with immunotherapy with Imfinzi 1500 mg IV every 4 weeks. Last dose of chemotherapy was given on November 13, 2020. This treatment was discontinued secondary to disease progression 3) Systemic chemotherapy with carboplatin for AUC of 5 on day 1, etoposide 100 mg/M2 on days 1, 2 and 3, Tecentriq 1200 mg IV every 3 weeks as well as Cosela 250 mg/M2 on the days of the chemotherapy every 3 weeks.  First dose December 18, 2020.  Status post 8 cycles. 4) Zepzelca 3.2 mgm2 IV every 3 weeks. Last dose on 01/23/22. Status post 12 cycles 5) Palliative systemic chemotherapy with irinotecan 65 mg/m2 on days 1 and 8 IV every 3 weeks.  Status post 3 cycles.  Last dose was given April 01, 2022 discontinued secondary to disease progression  CURRENT THERAPY: Patient referred to [facility] for ETCTN [id number] clinical trial which he declined due to travel burden. We are trying again with palliative systemic chemotherapy with carboplatin for an AC of  5 on day 1, etoposide 100 mg per metered squared on days 1, 2, and 3 with Cosela before chemotherapy.  First dose on 05/28/22.  Status post 7 cycles.    INTERVAL HISTORY: Adam Wise 71 y.o. male returns to the clinic today for a follow-up visit accompanied by his wife. The patient was last seen by Dr. Julien Nordmann on 10/02/22. The patient's treatment options are limited at this time, and he would like to continue on treatment as long as possible as his scans have been relatively stable.  Overall, the patient denies any major changes in his health since last being seen. He denies fevers, chills, night sweats, or unexplained weight loss.  Denies any cough or hemoptysis.  Follows closely with pulmonary medicine for his pleural effusions.  He saw Dr. Valeta Harms recently on 10/16/2022.  He denies any abnormal bleeding or bruising. Denies any nausea, vomiting, or diarrhea.  He sometimes has mild constipation.  Denies any headache or visual changes.  He is here today for evaluation and repeat blood work before starting cycle #8   MEDICAL HISTORY: Past Medical History:  Diagnosis Date   Chickenpox    Chronic knee pain    Chronic low back pain    COPD exacerbation ([place]) 10/11/2021   COPD with exacerbation ([place]) 10/12/2021   Coronary artery calcification seen on CAT scan 11/2021   Coronary CTA 11/27/2021: Moderate to large right pleural effusion and compressive atelectasis right lung base. => Coronary Calcium Score 657.  Diffuse RCA calcification.  Minimal  mild disease in the LAD and diagonal branches. == Overall limited study.  Notable artifact.   Essential hypertension    GERD (gastroesophageal reflux disease)    Hyperlipidemia    Malignant neoplasm of upper lobe of right lung (HCC) 04/04/2020   OSA (obstructive sleep apnea)    With nighttime oxygen supplementation   T4, M3, M1 C Metastatic Small Cell Lung Cancer 03/2020   large right upper lobe/right hilar mass with ipsilateral and contralateral mediastinal and  right supraclavicular lymphadenopathy in addition to multiple liver lesios. He has disease progression after the first line of chemotherapy in December 2021.   Testosterone deficiency    Type 2 diabetes mellitus (HCC)     ALLERGIES:  is allergic to bupropion and hydrochlorothiazide.  MEDICATIONS:  Current Outpatient Medications  Medication Sig Dispense Refill   albuterol (VENTOLIN HFA) 108 (90 Base) MCG/ACT inhaler Inhale 2 puffs into the lungs every 6 (six) hours as needed for wheezing or shortness of breath. 18 g 0   amLODipine (NORVASC) 10 MG tablet TAKE 1 TABLET BY MOUTH EVERY DAY FOR BLOOD PRESSURE 90 tablet 3   B-D 3CC LUER-LOK SYR 22GX1" 22G X 1" 3 ML MISC USE AS INSTRUCTED FOR TESTOSTERONE INJECTION EVERY 2 WEEKS 10 each 2   bisoprolol (ZEBETA) 5 MG tablet TAKE 1 TABLET (5 MG TOTAL) BY MOUTH DAILY. 90 tablet 2   Black Pepper-Turmeric (TURMERIC COMPLEX/BLACK PEPPER PO) Take 1 tablet by mouth in the morning and at bedtime.     blood glucose meter kit and supplies KIT Dispense based on patient and insurance preference. Use up to four times daily as directed. 1 each 0   Budeson-Glycopyrrol-Formoterol (BREZTRI AEROSPHERE) 160-9-4.8 MCG/ACT AERO Inhale 2 puffs into the lungs in the morning and at bedtime. 10.7 g 3   chlorpheniramine-HYDROcodone (TUSSIONEX) 10-8 MG/5ML Take 5 mLs by mouth every 12 (twelve) hours as needed for cough. 473 mL 0   Coenzyme Q10 (COQ10) 200 MG CAPS Take 200 mg by mouth at bedtime.     ELIQUIS 5 MG TABS tablet TAKE 1 TABLET BY MOUTH TWICE A DAY 180 tablet 2   esomeprazole (NEXIUM) 20 MG capsule Take 20 mg by mouth in the morning.     gabapentin (NEURONTIN) 300 MG capsule TAKE 1 CAPSULE (300 MG TOTAL) BY MOUTH 2 (TWO) TIMES DAILY. FOR BACK PAIN. 180 capsule 2   glipiZIDE (GLUCOTROL) 10 MG tablet TAKE 1 TABLET (10 MG TOTAL) BY MOUTH 2 (TWO) TIMES DAILY BEFORE A MEAL. FOR DIABETES. 180 tablet 0   glucose blood (ONETOUCH ULTRA) test strip USE UP TO 4 TIMES DAILY AS  DIRECTED 400 strip 5   Insulin Glargine (BASAGLAR KWIKPEN) 100 UNIT/ML Inject 24 Units into the skin at bedtime. For diabetes. 30 mL 1   Insulin Pen Needle (BD PEN NEEDLE NANO U/F) 32G X 4 MM MISC Use with insulin as prescribed Dx Code: E11.9 200 each 3   ipratropium-albuterol (DUONEB) 0.5-2.5 (3) MG/3ML SOLN Take 3 mLs by nebulization every 4 (four) hours while awake for 3 days, THEN 3 mLs every 4 (four) hours as needed (shortness of breath or wheezing). 360 mL 0   KRILL OIL PO Take 1 capsule by mouth daily.     Lancets MISC USE UP TO 3 TIMES DAILY AS DIRECTED 100 each 2   losartan (COZAAR) 50 MG tablet TAKE 1 TABLET (50 MG TOTAL) BY MOUTH DAILY. FOR BLOOD PRESSURE. 90 tablet 2   Melatonin 10 MG CAPS Take 10 mg by mouth at   bedtime.     metFORMIN (GLUCOPHAGE) 1000 MG tablet TAKE 1 TABLET (1,000 MG TOTAL) BY MOUTH 2 (TWO) TIMES DAILY WITH A MEAL. FOR DIABETES 180 tablet 0   Multiple Vitamin (MULTI-VITAMINS) TABS Take 1 tablet by mouth in the morning.     oxymetazoline (AFRIN) 0.05 % nasal spray Place 1 spray into both nostrils daily as needed for congestion.     pioglitazone (ACTOS) 45 MG tablet TAKE 1 TABLET (45 MG TOTAL) BY MOUTH DAILY. FOR DIABETES. 90 tablet 0   pravastatin (PRAVACHOL) 40 MG tablet TAKE 1 TABLET BY MOUTH EVERY DAY IN THE EVENING FOR CHOLESTEROL 90 tablet 2   testosterone cypionate (DEPOTESTOSTERONE CYPIONATE) 200 MG/ML injection Inject 200 mg into the muscle every 14 (fourteen) days.     traMADol (ULTRAM) 50 MG tablet Take 1 tablet (50 mg total) by mouth every 6 (six) hours as needed. (Patient not taking: Reported on 09/24/2022) 15 tablet 0   No current facility-administered medications for this visit.    SURGICAL HISTORY:  Past Surgical History:  Procedure Laterality Date   CHEST TUBE INSERTION Right 01/20/2022   Procedure: CHEST TUBE INSERTION;  Surgeon: Smith, Daniel C, MD;  Location: [facility];  Service: Pulmonary;  Laterality: Right;  w/ Talc Pleurodesis, planned  admit for Obs afterwards   CHEST TUBE INSERTION Right 04/28/2022   Procedure: INSERTION PLEURAL DRAINAGE CATHETER - Pigtail tail, drainage;  Surgeon: Smith, Daniel C, MD;  Location: [facility];  Service: Pulmonary;  Laterality: Right;  talc pleurodesis   CHEST TUBE INSERTION Right 09/01/2022   Procedure: INSERTION PLEURAL DRAINAGE CATHETER;  Surgeon: McQuaid, Douglas B, MD;  Location: [facility];  Service: Cardiopulmonary;  Laterality: Right;  pigtail catheter placement with talc pleurodesis   COLONOSCOPY WITH PROPOFOL N/A 12/17/2018   Procedure: COLONOSCOPY WITH PROPOFOL;  Surgeon: Tahiliani, Varnita B, MD;  Location: [facility] ENDOSCOPY;  Service: Gastroenterology;  Laterality: N/A;   IR IMAGING GUIDED PORT INSERTION  04/17/2020   IR THORACENTESIS ASP PLEURAL SPACE W/IMG GUIDE  11/29/2021   IR THORACENTESIS ASP PLEURAL SPACE W/IMG GUIDE  01/03/2022   JOINT REPLACEMENT Bilateral    REPLACEMENT TOTAL KNEE BILATERAL  2015   TALC PLEURODESIS  09/01/2022   Procedure: TALC PLEURADESIS;  Surgeon: McQuaid, Douglas B, MD;  Location: [facility];  Service: Cardiopulmonary;;   TONSILLECTOMY  1960   TRANSTHORACIC ECHOCARDIOGRAM  05/2020   a) 05/2020: EF 55 to 60%.  No R WMA.  Mild LVH.  Indeterminate LVEDP.  Unable to assess RVP.  Normal aortic and mitral valves.  Mildly elevated RAP.; b) 09/2021: EF 50-55%. No RWMA. Mild LVH. ~ LVEDP. Mild LA Dilation. NORMAL RV/RAP.  Normal MV/AoV.    REVIEW OF SYSTEMS:   Review of Systems  Constitutional: Negative for appetite change, chills, fatigue, fever and unexpected weight change.  HENT:   Negative for mouth sores, nosebleeds, sore throat and trouble swallowing.   Eyes: Negative for eye problems and icterus.  Respiratory: Negative for cough, hemoptysis, shortness of breath and wheezing.   Cardiovascular: Negative for chest pain and leg swelling.  Gastrointestinal: Negative for abdominal pain, constipation, diarrhea, nausea and vomiting.  Genitourinary: Negative  for bladder incontinence, difficulty urinating, dysuria, frequency and hematuria.   Musculoskeletal: Negative for back pain, gait problem, neck pain and neck stiffness.  Skin: Negative for itching and rash.  Neurological: Negative for dizziness, extremity weakness, gait problem, headaches, light-headedness and seizures.  Hematological: Negative for adenopathy. Does not bruise/bleed easily.  Psychiatric/Behavioral: Negative for confusion, depression and sleep disturbance.   The patient is not nervous/anxious.     PHYSICAL EXAMINATION:  There were no vitals taken for this visit.  ECOG PERFORMANCE STATUS: {CHL ONC ECOG PS:[id number]}  Physical Exam  Constitutional: Oriented to person, place, and time and well-developed, well-nourished, and in no distress. No distress.  HENT:  Head: Normocephalic and atraumatic.  Mouth/Throat: Oropharynx is clear and moist. No oropharyngeal exudate.  Eyes: Conjunctivae are normal. Right eye exhibits no discharge. Left eye exhibits no discharge. No scleral icterus.  Neck: Normal range of motion. Neck supple.  Cardiovascular: Normal rate, regular rhythm, normal heart sounds and intact distal pulses.   Pulmonary/Chest: Effort normal and breath sounds normal. No respiratory distress. No wheezes. No rales.  Abdominal: Soft. Bowel sounds are normal. Exhibits no distension and no mass. There is no tenderness.  Musculoskeletal: Normal range of motion. Exhibits no edema.  Lymphadenopathy:    No cervical adenopathy.  Neurological: Alert and oriented to person, place, and time. Exhibits normal muscle tone. Gait normal. Coordination normal.  Skin: Skin is warm and dry. No rash noted. Not diaphoretic. No erythema. No pallor.  Psychiatric: Mood, memory and judgment normal.  Vitals reviewed.  LABORATORY DATA: Lab Results  Component Value Date   WBC 2.5 (L) 10/22/2022   HGB 8.7 (L) 10/22/2022   HCT 27.5 (L) 10/22/2022   MCV 95.8 10/22/2022   PLT 81 (L) 10/22/2022       Chemistry      Component Value Date/Time   NA 141 10/07/2022 0825   K 3.9 10/07/2022 0825   CL 103 10/07/2022 0825   CO2 28 10/07/2022 0825   BUN 17 10/07/2022 0825   CREATININE 1.18 10/07/2022 0825   CREATININE 1.45 (H) 10/18/2021 1533      Component Value Date/Time   CALCIUM 8.7 (L) 10/07/2022 0825   ALKPHOS 56 10/07/2022 0825   AST 18 10/07/2022 0825   ALT 13 10/07/2022 0825   BILITOT 0.4 10/07/2022 0825       RADIOGRAPHIC STUDIES:  MR Brain W Wo Contrast  Result Date: 09/30/2022 CLINICAL DATA:  Lung cancer. Staging. Assess for metastatic disease. EXAM: MRI HEAD WITHOUT AND WITH CONTRAST TECHNIQUE: Multiplanar, multiecho pulse sequences of the brain and surrounding structures were obtained without and with intravenous contrast. CONTRAST:  10mL GADAVIST GADOBUTROL 1 MMOL/ML IV SOLN COMPARISON:  04/03/2020 and previous FINDINGS: Brain: Diffusion imaging does not show any acute or subacute infarction. There is no evidence of intracranial metastatic disease. No focal abnormality affects the brainstem or cerebellum. 4 mm benign enhancing foramen magnum lesions have been seen as distant as 2018 and are unchanged. These are not significant. Cerebral hemispheres show a chronic arachnoid cyst in the region of the sylvian fissure on the right. Minimal foci of T2 and FLAIR signal are present within the cerebral hemispheric white matter. No large vessel territory insult. No hemorrhage, hydrocephalus or extra-axial collection. No abnormal contrast enhancement occurs. Vascular: Major vessels at the base of the brain show flow. Skull and upper cervical spine: Negative Sinuses/Orbits: Clear/normal Other: None IMPRESSION: 1. No evidence of metastatic disease. 2. 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 3. Minimal small vessel change of the cerebral hemispheric white matter. Electronically Signed   By: Mark  Shogry M.D.   On: 09/30/2022 16:11   CT Chest W  Contrast  Result Date: 09/30/2022 CLINICAL DATA:  71-year-old male with history of small cell lung cancer. Evaluate for treatment response. * Tracking Code: BO * EXAM: CT CHEST, ABDOMEN,   AND PELVIS WITH CONTRAST TECHNIQUE: Multidetector CT imaging of the chest, abdomen and pelvis was performed following the standard protocol during bolus administration of intravenous contrast. RADIATION DOSE REDUCTION: This exam was performed according to the departmental dose-optimization program which includes automated exposure control, adjustment of the mA and/or kV according to patient size and/or use of iterative reconstruction technique. CONTRAST:  100mL OMNIPAQUE IOHEXOL 300 MG/ML  SOLN COMPARISON:  CT of the chest, abdomen and pelvis 08/15/2022. FINDINGS: CT CHEST FINDINGS Cardiovascular: Heart size is normal. There is no significant pericardial fluid, thickening or pericardial calcification. There is aortic atherosclerosis, as well as atherosclerosis of the great vessels of the mediastinum and the coronary arteries, including calcified atherosclerotic plaque in the left main, left anterior descending, left circumflex and right coronary arteries. Right internal jugular single-lumen Port-A-Cath with tip terminating at the superior cavoatrial junction. Mediastinum/Nodes: No pathologically enlarged mediastinal or hilar lymph nodes. Esophagus is unremarkable in appearance. No axillary lymphadenopathy. Lungs/Pleura: Chronic mass-like area of architectural distortion and volume loss with extensive traction bronchiectasis noted throughout the medial aspect of the right mid to upper lung, similar to prior examinations. In the right upper lobe (axial image 33 of series 7) there is an adjacent nodular density measuring 1.4 x 1.6 cm, which is similar to the prior examination. No other new suspicious appearing pulmonary nodules or masses are noted. Small right pleural effusion (decreased compared to the prior study) and chronic  pleural thickening and pleural calcification (likely from prior pleurodesis). No left pleural effusion. Musculoskeletal: There are no aggressive appearing lytic or blastic lesions noted in the visualized portions of the skeleton. CT ABDOMEN PELVIS FINDINGS Hepatobiliary: Multiple hypovascular hepatic lesions appear similar to the prior study, largest of which is in the periphery of the liver between segments 4B and 5 (axial image 56 of series 2) currently measuring 4.1 x 3.1 cm (previously 3.2 x 2.5 cm). Lesion in the caudate lobe (axial image 53 of series 2) currently measures 2.1 x 1.8 cm (previously 2.0 x 1.5 cm). Lesion in superior aspect of the right lobe of the liver between segments 7 and 8 (axial image 43 of series 2) currently measures 1.8 x 1.6 cm (previously 2.1 x 1.5 cm). No other new hepatic lesions are noted. No intra or extrahepatic biliary ductal dilatation. Gallbladder is unremarkable in appearance. Pancreas: No pancreatic mass. No pancreatic ductal dilatation. No pancreatic or peripancreatic fluid collections or inflammatory changes. Spleen: Unremarkable. Adrenals/Urinary Tract: Low-attenuation lesions in the right kidney measuring up to 6.7 cm in diameter in the interpolar region, compatible with simple (Bosniak class 1) cysts. Additional subcentimeter low-attenuation lesion in the lateral aspect of the upper pole of the left kidney, too small to definitively characterize, but statistically likely a small cysts (no imaging follow-up recommended). No aggressive appearing renal lesions. Left adrenal gland is normal in appearance. 1.6 x 1.2 cm nodule in the medial limb of the right adrenal gland is unchanged. No hydroureteronephrosis. Urinary bladder is unremarkable in appearance. Stomach/Bowel: The appearance of the stomach is normal. There is no pathologic dilatation of small bowel or colon. Normal appendix. Vascular/Lymphatic: No significant atherosclerotic disease, aneurysm or dissection noted in  the abdominal or pelvic vasculature. No lymphadenopathy noted in the abdomen or pelvis. Reproductive: Prostate gland and seminal vesicles are unremarkable in appearance. Other: No significant volume of ascites.  No pneumoperitoneum. Musculoskeletal: There are no aggressive appearing lytic or blastic lesions noted in the visualized portions of the skeleton. IMPRESSION: 1. Slight interval growth of metastatic lesion   in the periphery of the liver between segments 4B and 5. Other metastatic lesions in the liver appear stable. 2. Stable postradiation findings in the right lung, with no definitive evidence to suggest local recurrence of disease or definite metastatic disease in the thorax. 3. Slight decreased size of now small chronic right pleural effusion. 4. Aortic atherosclerosis, in addition to left main and three-vessel coronary artery disease. Assessment for potential risk factor modification, dietary therapy or pharmacologic therapy may be warranted, if clinically indicated. 5. Additional incidental findings, as above. Electronically Signed   By: Daniel  Entrikin M.D.   On: 09/30/2022 07:59   CT Abdomen Pelvis W Contrast  Result Date: 09/30/2022 CLINICAL DATA:  71-year-old male with history of small cell lung cancer. Evaluate for treatment response. * Tracking Code: BO * EXAM: CT CHEST, ABDOMEN, AND PELVIS WITH CONTRAST TECHNIQUE: Multidetector CT imaging of the chest, abdomen and pelvis was performed following the standard protocol during bolus administration of intravenous contrast. RADIATION DOSE REDUCTION: This exam was performed according to the departmental dose-optimization program which includes automated exposure control, adjustment of the mA and/or kV according to patient size and/or use of iterative reconstruction technique. CONTRAST:  100mL OMNIPAQUE IOHEXOL 300 MG/ML  SOLN COMPARISON:  CT of the chest, abdomen and pelvis 08/15/2022. FINDINGS: CT CHEST FINDINGS Cardiovascular: Heart size is  normal. There is no significant pericardial fluid, thickening or pericardial calcification. There is aortic atherosclerosis, as well as atherosclerosis of the great vessels of the mediastinum and the coronary arteries, including calcified atherosclerotic plaque in the left main, left anterior descending, left circumflex and right coronary arteries. Right internal jugular single-lumen Port-A-Cath with tip terminating at the superior cavoatrial junction. Mediastinum/Nodes: No pathologically enlarged mediastinal or hilar lymph nodes. Esophagus is unremarkable in appearance. No axillary lymphadenopathy. Lungs/Pleura: Chronic mass-like area of architectural distortion and volume loss with extensive traction bronchiectasis noted throughout the medial aspect of the right mid to upper lung, similar to prior examinations. In the right upper lobe (axial image 33 of series 7) there is an adjacent nodular density measuring 1.4 x 1.6 cm, which is similar to the prior examination. No other new suspicious appearing pulmonary nodules or masses are noted. Small right pleural effusion (decreased compared to the prior study) and chronic pleural thickening and pleural calcification (likely from prior pleurodesis). No left pleural effusion. Musculoskeletal: There are no aggressive appearing lytic or blastic lesions noted in the visualized portions of the skeleton. CT ABDOMEN PELVIS FINDINGS Hepatobiliary: Multiple hypovascular hepatic lesions appear similar to the prior study, largest of which is in the periphery of the liver between segments 4B and 5 (axial image 56 of series 2) currently measuring 4.1 x 3.1 cm (previously 3.2 x 2.5 cm). Lesion in the caudate lobe (axial image 53 of series 2) currently measures 2.1 x 1.8 cm (previously 2.0 x 1.5 cm). Lesion in superior aspect of the right lobe of the liver between segments 7 and 8 (axial image 43 of series 2) currently measures 1.8 x 1.6 cm (previously 2.1 x 1.5 cm). No other new  hepatic lesions are noted. No intra or extrahepatic biliary ductal dilatation. Gallbladder is unremarkable in appearance. Pancreas: No pancreatic mass. No pancreatic ductal dilatation. No pancreatic or peripancreatic fluid collections or inflammatory changes. Spleen: Unremarkable. Adrenals/Urinary Tract: Low-attenuation lesions in the right kidney measuring up to 6.7 cm in diameter in the interpolar region, compatible with simple (Bosniak class 1) cysts. Additional subcentimeter low-attenuation lesion in the lateral aspect of the upper pole of the   left kidney, too small to definitively characterize, but statistically likely a small cysts (no imaging follow-up recommended). No aggressive appearing renal lesions. Left adrenal gland is normal in appearance. 1.6 x 1.2 cm nodule in the medial limb of the right adrenal gland is unchanged. No hydroureteronephrosis. Urinary bladder is unremarkable in appearance. Stomach/Bowel: The appearance of the stomach is normal. There is no pathologic dilatation of small bowel or colon. Normal appendix. Vascular/Lymphatic: No significant atherosclerotic disease, aneurysm or dissection noted in the abdominal or pelvic vasculature. No lymphadenopathy noted in the abdomen or pelvis. Reproductive: Prostate gland and seminal vesicles are unremarkable in appearance. Other: No significant volume of ascites.  No pneumoperitoneum. Musculoskeletal: There are no aggressive appearing lytic or blastic lesions noted in the visualized portions of the skeleton. IMPRESSION: 1. Slight interval growth of metastatic lesion in the periphery of the liver between segments 4B and 5. Other metastatic lesions in the liver appear stable. 2. Stable postradiation findings in the right lung, with no definitive evidence to suggest local recurrence of disease or definite metastatic disease in the thorax. 3. Slight decreased size of now small chronic right pleural effusion. 4. Aortic atherosclerosis, in addition to  left main and three-vessel coronary artery disease. Assessment for potential risk factor modification, dietary therapy or pharmacologic therapy may be warranted, if clinically indicated. 5. Additional incidental findings, as above. Electronically Signed   By: Daniel  Entrikin M.D.   On: 09/30/2022 07:59     ASSESSMENT/PLAN:  This is a very pleasant 71 year old Caucasian male diagnosed with extensive stage (T4, N3, M1C) small cell lung cancer presented with large right upper lobe lung mass in addition to mediastinal and right supraclavicular lymphadenopathy and multiple metastatic liver lesions diagnosed in April 2021.   The patient initially underwent systemic chemotherapy with carboplatin for an AUC of 5 on day 1, etoposide 100 mg per metered squared on days 1, 2, and 3 in addition to Cosela for myeloprotection.  He also received immunotherapy with Imfinzi on day 1 of every chemotherapy cycle.  He is status post 8 cycles.  Starting from cycle #5 he was on single agent immunotherapy with Imfinzi IV every 4 weeks   The patient had evidence of disease progression on his scan from December 2021.   He was then started on systemic chemotherapy with carboplatin for an AUC of 5 on day 1, etoposide 100 mg per metered squared on days 1, 2, and 3 in addition to Tecentriq 1200 mg on day 1, the patient is status post 8 cycles.  Starting from cycle #5, the patient started maintenance immunotherapy with Tecentriq.  This was discontinued due to evidence of disease progression.   The patient then underwent Zepzelca IV every 3 weeks.  He is status post his 12 cycles and he tolerated it well except for fatigue a few days after treatment.  This was discontinued in February 2023 due to evidence of disease progression.    The patient then underwent treatment with irinotecan 65 mg per metered squared on days 1 and 8 IV every 3 weeks.  The patient is status post 3 cycles and tolerated it fairly well without any concerning  adverse side effects.  This was discontinued secondary to disease progression.   Dr. Mohamed saw the patient after this to discuss the options including palliative care and hospice versus single agent Gemzar, or Taxol.  He also discussed repeating his initial treatment with carboplatin and etoposide since he had a good response to treatment in the   past.   He was referred to Dr. Weiss at [facility] for consideration of enrollment in the clinical trial with tarlatamab.    He also saw Dr. Petty at [facility] for consideration of enrollment in the ETCTN 10445 trial.  The patient was not interested due to the travel burden.   The patient was interested in restarting systemic chemotherapy with carboplatin for an AUC of 5 on day 1 and etoposide 100 mg per metered squared on days 1, 2, and 3 with Cosela. He is status post 7 cycles and tolerated it well. His dose of carboplatin was reduced to an AUC of 4 and etoposide 80 mg per metered squared.   The patient was seen with Dr. Mohamed.  Labs were reviewed.  Recommend that he proceed with cycle #8 today scheduled.  We will see him back for follow-up visit in 3 weeks for evaluation repeat blood work before undergoing cycle #9.  Scans?  The patient was advised to call immediately if he has any concerning symptoms in the interval. The patient voices understanding of current disease status and treatment options and is in agreement with the current care plan. All questions were answered. The patient knows to call the clinic with any problems, questions or concerns. We can certainly see the patient much sooner if necessary          No orders of the defined types were placed in this encounter.    I spent {CHL ONC TIME VISIT - SWIFT:1154000130} counseling the patient face to face. The total time spent in the appointment was {CHL ONC TIME VISIT - SWIFT:1154000130}.  Cassandra L Heilingoetter, PA-C 10/24/22  

## 2022-10-27 MED FILL — Dexamethasone Sodium Phosphate Inj 100 MG/10ML: INTRAMUSCULAR | Qty: 1 | Status: AC

## 2022-10-27 MED FILL — Fosaprepitant Dimeglumine For IV Infusion 150 MG (Base Eq): INTRAVENOUS | Qty: 5 | Status: AC

## 2022-10-28 ENCOUNTER — Inpatient Hospital Stay (HOSPITAL_BASED_OUTPATIENT_CLINIC_OR_DEPARTMENT_OTHER): Payer: Medicare Other | Admitting: Physician Assistant

## 2022-10-28 ENCOUNTER — Inpatient Hospital Stay: Payer: Medicare Other

## 2022-10-28 VITALS — BP 131/81 | HR 90 | Temp 97.7°F | Resp 16 | Wt 298.5 lb

## 2022-10-28 DIAGNOSIS — R42 Dizziness and giddiness: Secondary | ICD-10-CM | POA: Diagnosis not present

## 2022-10-28 DIAGNOSIS — Z5111 Encounter for antineoplastic chemotherapy: Secondary | ICD-10-CM | POA: Diagnosis not present

## 2022-10-28 DIAGNOSIS — C3411 Malignant neoplasm of upper lobe, right bronchus or lung: Secondary | ICD-10-CM | POA: Diagnosis not present

## 2022-10-28 DIAGNOSIS — C3490 Malignant neoplasm of unspecified part of unspecified bronchus or lung: Secondary | ICD-10-CM | POA: Diagnosis not present

## 2022-10-28 DIAGNOSIS — C787 Secondary malignant neoplasm of liver and intrahepatic bile duct: Secondary | ICD-10-CM | POA: Diagnosis not present

## 2022-10-28 DIAGNOSIS — J90 Pleural effusion, not elsewhere classified: Secondary | ICD-10-CM | POA: Diagnosis not present

## 2022-10-28 LAB — CBC WITH DIFFERENTIAL (CANCER CENTER ONLY)
Abs Immature Granulocytes: 0.03 10*3/uL (ref 0.00–0.07)
Basophils Absolute: 0 10*3/uL (ref 0.0–0.1)
Basophils Relative: 1 %
Eosinophils Absolute: 0.1 10*3/uL (ref 0.0–0.5)
Eosinophils Relative: 2 %
HCT: 27.2 % — ABNORMAL LOW (ref 39.0–52.0)
Hemoglobin: 8.7 g/dL — ABNORMAL LOW (ref 13.0–17.0)
Immature Granulocytes: 1 %
Lymphocytes Relative: 17 %
Lymphs Abs: 0.4 10*3/uL — ABNORMAL LOW (ref 0.7–4.0)
MCH: 30.9 pg (ref 26.0–34.0)
MCHC: 32 g/dL (ref 30.0–36.0)
MCV: 96.5 fL (ref 80.0–100.0)
Monocytes Absolute: 0.5 10*3/uL (ref 0.1–1.0)
Monocytes Relative: 17 %
Neutro Abs: 1.6 10*3/uL — ABNORMAL LOW (ref 1.7–7.7)
Neutrophils Relative %: 62 %
Platelet Count: 168 10*3/uL (ref 150–400)
RBC: 2.82 MIL/uL — ABNORMAL LOW (ref 4.22–5.81)
RDW: 18 % — ABNORMAL HIGH (ref 11.5–15.5)
WBC Count: 2.6 10*3/uL — ABNORMAL LOW (ref 4.0–10.5)
nRBC: 1.2 % — ABNORMAL HIGH (ref 0.0–0.2)

## 2022-10-28 LAB — CMP (CANCER CENTER ONLY)
ALT: 12 U/L (ref 0–44)
AST: 15 U/L (ref 15–41)
Albumin: 4 g/dL (ref 3.5–5.0)
Alkaline Phosphatase: 71 U/L (ref 38–126)
Anion gap: 8 (ref 5–15)
BUN: 16 mg/dL (ref 8–23)
CO2: 28 mmol/L (ref 22–32)
Calcium: 8.3 mg/dL — ABNORMAL LOW (ref 8.9–10.3)
Chloride: 104 mmol/L (ref 98–111)
Creatinine: 1.04 mg/dL (ref 0.61–1.24)
GFR, Estimated: >60 mL/min (ref >60)
Glucose, Bld: 183 mg/dL — ABNORMAL HIGH (ref 70–99)
Potassium: 4 mmol/L (ref 3.5–5.1)
Sodium: 140 mmol/L (ref 135–145)
Total Bilirubin: 0.3 mg/dL (ref 0.3–1.2)
Total Protein: 6.5 g/dL (ref 6.5–8.1)

## 2022-10-28 LAB — SAMPLE TO BLOOD BANK

## 2022-10-28 MED ORDER — SODIUM CHLORIDE 0.9 % IV SOLN
530.0000 mg | Freq: Once | INTRAVENOUS | Status: AC
  Administered 2022-10-28: 530 mg via INTRAVENOUS
  Filled 2022-10-28: qty 53

## 2022-10-28 MED ORDER — SODIUM CHLORIDE 0.9 % IV SOLN: Freq: Once | INTRAVENOUS | Status: AC

## 2022-10-28 MED ORDER — TRILACICLIB DIHYDROCHLORIDE INJECTION 300 MG
240.0000 mg/m2 | Freq: Once | INTRAVENOUS | Status: AC
  Administered 2022-10-28: 600 mg via INTRAVENOUS
  Filled 2022-10-28: qty 40

## 2022-10-28 MED ORDER — SODIUM CHLORIDE 0.9 % IV SOLN
80.0000 mg/m2 | Freq: Once | INTRAVENOUS | Status: AC
  Administered 2022-10-28: 210 mg via INTRAVENOUS
  Filled 2022-10-28: qty 10.5

## 2022-10-28 MED ORDER — PALONOSETRON HCL INJECTION 0.25 MG/5ML
0.2500 mg | Freq: Once | INTRAVENOUS | Status: AC
  Administered 2022-10-28: 0.25 mg via INTRAVENOUS
  Filled 2022-10-28: qty 5

## 2022-10-28 MED ORDER — MECLIZINE HCL 12.5 MG PO TABS: 12.5000 mg | ORAL_TABLET | Freq: Three times a day (TID) | ORAL | 1 refills | Status: DC | PRN

## 2022-10-28 MED ORDER — SODIUM CHLORIDE 0.9 % IV SOLN
150.0000 mg | Freq: Once | INTRAVENOUS | Status: AC
  Administered 2022-10-28: 150 mg via INTRAVENOUS
  Filled 2022-10-28: qty 150

## 2022-10-28 MED ORDER — FAMOTIDINE IN NACL 20-0.9 MG/50ML-% IV SOLN
20.0000 mg | Freq: Once | INTRAVENOUS | Status: AC
  Administered 2022-10-28: 20 mg via INTRAVENOUS
  Filled 2022-10-28: qty 50

## 2022-10-28 MED ORDER — SODIUM CHLORIDE 0.9 % IV SOLN
10.0000 mg | Freq: Once | INTRAVENOUS | Status: AC
  Administered 2022-10-28: 10 mg via INTRAVENOUS
  Filled 2022-10-28: qty 10

## 2022-10-28 MED FILL — Dexamethasone Sodium Phosphate Inj 100 MG/10ML: INTRAMUSCULAR | Qty: 1 | Status: AC

## 2022-10-28 NOTE — Patient Instructions (Signed)
[facility]  Discharge Instructions: Thank you for choosing [facility] to provide your oncology and hematology care.   If you have a lab appointment with the [facility], please go directly to the [facility] and check in at the registration area.   Wear comfortable clothing and clothing appropriate for easy access to any Portacath or PICC line.   We strive to give you quality time with your provider. You may need to reschedule your appointment if you arrive late (15 or more minutes).  Arriving late affects you and other patients whose appointments are after yours.  Also, if you miss three or more appointments without notifying the office, you may be dismissed from the clinic at the provider's discretion.      For prescription refill requests, have your pharmacy contact our office and allow 72 hours for refills to be completed.    Today you received the following chemotherapy and/or immunotherapy agents: Cosela, Etoposide, carboplatin   To help prevent nausea and vomiting after your treatment, we encourage you to take your nausea medication as directed.  BELOW ARE SYMPTOMS THAT SHOULD BE REPORTED IMMEDIATELY: *FEVER GREATER THAN 100.4 F (38 C) OR HIGHER *CHILLS OR SWEATING *NAUSEA AND VOMITING THAT IS NOT CONTROLLED WITH YOUR NAUSEA MEDICATION *UNUSUAL SHORTNESS OF BREATH *UNUSUAL BRUISING OR BLEEDING *URINARY PROBLEMS (pain or burning when urinating, or frequent urination) *BOWEL PROBLEMS (unusual diarrhea, constipation, pain near the anus) TENDERNESS IN MOUTH AND THROAT WITH OR WITHOUT PRESENCE OF ULCERS (sore throat, sores in mouth, or a toothache) UNUSUAL RASH, SWELLING OR PAIN  UNUSUAL VAGINAL DISCHARGE OR ITCHING   Items with * indicate a potential emergency and should be followed up as soon as possible or go to the Emergency Department if any problems should occur.  Please show the CHEMOTHERAPY ALERT CARD or IMMUNOTHERAPY ALERT  CARD at check-in to the Emergency Department and triage nurse.  Should you have questions after your visit or need to cancel or reschedule your appointment, please contact [facility]  Dept: [phone]  and follow the prompts.  Office hours are 8:00 a.m. to 4:30 p.m. Monday - Friday. Please note that voicemails left after 4:00 p.m. may not be returned until the following business day.  We are closed weekends and major holidays. You have access to a nurse at all times for urgent questions. Please call the main number to the clinic Dept: [phone] and follow the prompts.   For any non-urgent questions, you may also contact your provider using MyChart. We now offer e-Visits for anyone 59 and older to request care online for non-urgent symptoms. For details visit [email or web address].   Also download the MyChart app! Go to the app store, search "MyChart", open the app, select [facility], and log in with your MyChart username and password.  Masks are optional in the cancer centers. If you would like for your care team to wear a mask while they are taking care of you, please let them know. You may have one support person who is at least 72 years old accompany you for your appointments.

## 2022-10-28 NOTE — Progress Notes (Signed)
Per Dr. Julien Nordmann, carboplatin dose to stay at 530mg  due to previous exposure to carboplatin.  TO Dr. Harrold Donath, PharmD PGY-2 Pharmacy Resident Hematology/Oncology [phone]  10/28/2022 11:14 AM

## 2022-10-29 ENCOUNTER — Inpatient Hospital Stay: Payer: Medicare Other

## 2022-10-29 ENCOUNTER — Other Ambulatory Visit: Payer: Self-pay

## 2022-10-29 DIAGNOSIS — C3411 Malignant neoplasm of upper lobe, right bronchus or lung: Secondary | ICD-10-CM

## 2022-10-29 DIAGNOSIS — Z5111 Encounter for antineoplastic chemotherapy: Secondary | ICD-10-CM | POA: Diagnosis not present

## 2022-10-29 DIAGNOSIS — C787 Secondary malignant neoplasm of liver and intrahepatic bile duct: Secondary | ICD-10-CM | POA: Diagnosis not present

## 2022-10-29 DIAGNOSIS — J90 Pleural effusion, not elsewhere classified: Secondary | ICD-10-CM | POA: Diagnosis not present

## 2022-10-29 MED ORDER — SODIUM CHLORIDE 0.9 % IV SOLN: Freq: Once | INTRAVENOUS | Status: AC

## 2022-10-29 MED ORDER — SODIUM CHLORIDE 0.9 % IV SOLN
80.0000 mg/m2 | Freq: Once | INTRAVENOUS | Status: AC
  Administered 2022-10-29: 210 mg via INTRAVENOUS
  Filled 2022-10-29: qty 10.5

## 2022-10-29 MED ORDER — SODIUM CHLORIDE 0.9 % IV SOLN
10.0000 mg | Freq: Once | INTRAVENOUS | Status: AC
  Administered 2022-10-29: 10 mg via INTRAVENOUS
  Filled 2022-10-29: qty 10

## 2022-10-29 MED ORDER — SODIUM CHLORIDE 0.9% FLUSH
10.0000 mL | INTRAVENOUS | Status: DC | PRN
  Administered 2022-10-29: 10 mL

## 2022-10-29 MED ORDER — TRILACICLIB DIHYDROCHLORIDE INJECTION 300 MG
240.0000 mg/m2 | Freq: Once | INTRAVENOUS | Status: AC
  Administered 2022-10-29: 600 mg via INTRAVENOUS
  Filled 2022-10-29: qty 40

## 2022-10-29 MED ORDER — HEPARIN SOD (PORK) LOCK FLUSH 100 UNIT/ML IV SOLN
500.0000 [IU] | Freq: Once | INTRAVENOUS | Status: AC | PRN
  Administered 2022-10-29: 500 [IU]

## 2022-10-29 MED FILL — Dexamethasone Sodium Phosphate Inj 100 MG/10ML: INTRAMUSCULAR | Qty: 1 | Status: AC

## 2022-10-29 NOTE — Patient Instructions (Signed)
[facility]  Discharge Instructions: Thank you for choosing [facility] to provide your oncology and hematology care.   If you have a lab appointment with the Cancer Center, please go directly to the Cancer Center and check in at the registration area.   Wear comfortable clothing and clothing appropriate for easy access to any Portacath or PICC line.   We strive to give you quality time with your provider. You may need to reschedule your appointment if you arrive late (15 or more minutes).  Arriving late affects you and other patients whose appointments are after yours.  Also, if you miss three or more appointments without notifying the office, you may be dismissed from the clinic at the provider's discretion.      For prescription refill requests, have your pharmacy contact our office and allow 72 hours for refills to be completed.    Today you received the following chemotherapy and/or immunotherapy agents: Etoposide      To help prevent nausea and vomiting after your treatment, we encourage you to take your nausea medication as directed.  BELOW ARE SYMPTOMS THAT SHOULD BE REPORTED IMMEDIATELY: *FEVER GREATER THAN 100.4 F (38 C) OR HIGHER *CHILLS OR SWEATING *NAUSEA AND VOMITING THAT IS NOT CONTROLLED WITH YOUR NAUSEA MEDICATION *UNUSUAL SHORTNESS OF BREATH *UNUSUAL BRUISING OR BLEEDING *URINARY PROBLEMS (pain or burning when urinating, or frequent urination) *BOWEL PROBLEMS (unusual diarrhea, constipation, pain near the anus) TENDERNESS IN MOUTH AND THROAT WITH OR WITHOUT PRESENCE OF ULCERS (sore throat, sores in mouth, or a toothache) UNUSUAL RASH, SWELLING OR PAIN  UNUSUAL VAGINAL DISCHARGE OR ITCHING   Items with * indicate a potential emergency and should be followed up as soon as possible or go to the Emergency Department if any problems should occur.  Please show the CHEMOTHERAPY ALERT CARD or IMMUNOTHERAPY ALERT CARD at check-in to  the Emergency Department and triage nurse.  Should you have questions after your visit or need to cancel or reschedule your appointment, please contact [facility]  Dept: [phone]  and follow the prompts.  Office hours are 8:00 a.m. to 4:30 p.m. Monday - Friday. Please note that voicemails left after 4:00 p.m. may not be returned until the following business day.  We are closed weekends and major holidays. You have access to a nurse at all times for urgent questions. Please call the main number to the clinic Dept: [phone] and follow the prompts.   For any non-urgent questions, you may also contact your provider using MyChart. We now offer e-Visits for anyone 18 and older to request care online for non-urgent symptoms. For details visit [email or web address].   Also download the MyChart app! Go to the app store, search "MyChart", open the app, select [facility], and log in with your MyChart username and password.  Masks are optional in the cancer centers. If you would like for your care team to wear a mask while they are taking care of you, please let them know. You may have one support person who is at least 72 years old accompany you for your appointments. 

## 2022-10-30 ENCOUNTER — Ambulatory Visit (INDEPENDENT_AMBULATORY_CARE_PROVIDER_SITE_OTHER): Payer: Medicare Other

## 2022-10-30 ENCOUNTER — Inpatient Hospital Stay: Payer: Medicare Other

## 2022-10-30 VITALS — BP 151/86 | HR 85 | Temp 98.0°F | Resp 20 | Ht 68.0 in | Wt 288.2 lb

## 2022-10-30 DIAGNOSIS — C3411 Malignant neoplasm of upper lobe, right bronchus or lung: Secondary | ICD-10-CM | POA: Diagnosis not present

## 2022-10-30 DIAGNOSIS — Z23 Encounter for immunization: Secondary | ICD-10-CM | POA: Diagnosis not present

## 2022-10-30 DIAGNOSIS — C787 Secondary malignant neoplasm of liver and intrahepatic bile duct: Secondary | ICD-10-CM | POA: Diagnosis not present

## 2022-10-30 DIAGNOSIS — Z5111 Encounter for antineoplastic chemotherapy: Secondary | ICD-10-CM | POA: Diagnosis not present

## 2022-10-30 DIAGNOSIS — J90 Pleural effusion, not elsewhere classified: Secondary | ICD-10-CM | POA: Diagnosis not present

## 2022-10-30 MED ORDER — SODIUM CHLORIDE 0.9 % IV SOLN: Freq: Once | INTRAVENOUS | Status: AC

## 2022-10-30 MED ORDER — HEPARIN SOD (PORK) LOCK FLUSH 100 UNIT/ML IV SOLN
500.0000 [IU] | Freq: Once | INTRAVENOUS | Status: AC | PRN
  Administered 2022-10-30: 500 [IU]

## 2022-10-30 MED ORDER — TRILACICLIB DIHYDROCHLORIDE INJECTION 300 MG
240.0000 mg/m2 | Freq: Once | INTRAVENOUS | Status: AC
  Administered 2022-10-30: 600 mg via INTRAVENOUS
  Filled 2022-10-30: qty 40

## 2022-10-30 MED ORDER — SODIUM CHLORIDE 0.9 % IV SOLN
80.0000 mg/m2 | Freq: Once | INTRAVENOUS | Status: AC
  Administered 2022-10-30: 210 mg via INTRAVENOUS
  Filled 2022-10-30: qty 10.5

## 2022-10-30 MED ORDER — SODIUM CHLORIDE 0.9 % IV SOLN
10.0000 mg | Freq: Once | INTRAVENOUS | Status: AC
  Administered 2022-10-30: 10 mg via INTRAVENOUS
  Filled 2022-10-30: qty 10

## 2022-10-30 MED ORDER — SODIUM CHLORIDE 0.9% FLUSH
10.0000 mL | INTRAVENOUS | Status: DC | PRN
  Administered 2022-10-30: 10 mL

## 2022-10-30 NOTE — Patient Instructions (Signed)
[facility]  Discharge Instructions: Thank you for choosing [facility] to provide your oncology and hematology care.   If you have a lab appointment with the Cancer Center, please go directly to the Cancer Center and check in at the registration area.   Wear comfortable clothing and clothing appropriate for easy access to any Portacath or PICC line.   We strive to give you quality time with your provider. You may need to reschedule your appointment if you arrive late (15 or more minutes).  Arriving late affects you and other patients whose appointments are after yours.  Also, if you miss three or more appointments without notifying the office, you may be dismissed from the clinic at the provider's discretion.      For prescription refill requests, have your pharmacy contact our office and allow 72 hours for refills to be completed.    Today you received the following chemotherapy and/or immunotherapy agents; cosela & etoposide      To help prevent nausea and vomiting after your treatment, we encourage you to take your nausea medication as directed.  BELOW ARE SYMPTOMS THAT SHOULD BE REPORTED IMMEDIATELY: *FEVER GREATER THAN 100.4 F (38 C) OR HIGHER *CHILLS OR SWEATING *NAUSEA AND VOMITING THAT IS NOT CONTROLLED WITH YOUR NAUSEA MEDICATION *UNUSUAL SHORTNESS OF BREATH *UNUSUAL BRUISING OR BLEEDING *URINARY PROBLEMS (pain or burning when urinating, or frequent urination) *BOWEL PROBLEMS (unusual diarrhea, constipation, pain near the anus) TENDERNESS IN MOUTH AND THROAT WITH OR WITHOUT PRESENCE OF ULCERS (sore throat, sores in mouth, or a toothache) UNUSUAL RASH, SWELLING OR PAIN  UNUSUAL VAGINAL DISCHARGE OR ITCHING   Items with * indicate a potential emergency and should be followed up as soon as possible or go to the Emergency Department if any problems should occur.  Please show the CHEMOTHERAPY ALERT CARD or IMMUNOTHERAPY ALERT CARD at  check-in to the Emergency Department and triage nurse.  Should you have questions after your visit or need to cancel or reschedule your appointment, please contact [facility]  Dept: [phone]  and follow the prompts.  Office hours are 8:00 a.m. to 4:30 p.m. Monday - Friday. Please note that voicemails left after 4:00 p.m. may not be returned until the following business day.  We are closed weekends and major holidays. You have access to a nurse at all times for urgent questions. Please call the main number to the clinic Dept: [phone] and follow the prompts.   For any non-urgent questions, you may also contact your provider using MyChart. We now offer e-Visits for anyone 18 and older to request care online for non-urgent symptoms. For details visit [email or web address].   Also download the MyChart app! Go to the app store, search "MyChart", open the app, select [facility], and log in with your MyChart username and password.  Masks are optional in the cancer centers. If you would like for your care team to wear a mask while they are taking care of you, please let them know. You may have one support person who is at least 72 years old accompany you for your appointments. 

## 2022-11-01 ENCOUNTER — Encounter: Payer: Self-pay | Admitting: Internal Medicine

## 2022-11-04 ENCOUNTER — Inpatient Hospital Stay: Payer: Medicare Other

## 2022-11-04 ENCOUNTER — Other Ambulatory Visit: Payer: Medicare Other

## 2022-11-04 ENCOUNTER — Telehealth: Payer: Self-pay | Admitting: Internal Medicine

## 2022-11-04 DIAGNOSIS — Z5111 Encounter for antineoplastic chemotherapy: Secondary | ICD-10-CM | POA: Diagnosis not present

## 2022-11-04 DIAGNOSIS — C3411 Malignant neoplasm of upper lobe, right bronchus or lung: Secondary | ICD-10-CM | POA: Diagnosis not present

## 2022-11-04 DIAGNOSIS — J90 Pleural effusion, not elsewhere classified: Secondary | ICD-10-CM | POA: Diagnosis not present

## 2022-11-04 DIAGNOSIS — C787 Secondary malignant neoplasm of liver and intrahepatic bile duct: Secondary | ICD-10-CM | POA: Diagnosis not present

## 2022-11-04 LAB — CBC WITH DIFFERENTIAL (CANCER CENTER ONLY)
Abs Immature Granulocytes: 0.07 10*3/uL (ref 0.00–0.07)
Basophils Absolute: 0 10*3/uL (ref 0.0–0.1)
Basophils Relative: 1 %
Eosinophils Absolute: 0.1 10*3/uL (ref 0.0–0.5)
Eosinophils Relative: 2 %
HCT: 28.1 % — ABNORMAL LOW (ref 39.0–52.0)
Hemoglobin: 9.3 g/dL — ABNORMAL LOW (ref 13.0–17.0)
Immature Granulocytes: 2 %
Lymphocytes Relative: 13 %
Lymphs Abs: 0.5 10*3/uL — ABNORMAL LOW (ref 0.7–4.0)
MCH: 31.6 pg (ref 26.0–34.0)
MCHC: 33.1 g/dL (ref 30.0–36.0)
MCV: 95.6 fL (ref 80.0–100.0)
Monocytes Absolute: 0.1 10*3/uL (ref 0.1–1.0)
Monocytes Relative: 4 %
Neutro Abs: 2.8 10*3/uL (ref 1.7–7.7)
Neutrophils Relative %: 78 %
Platelet Count: 127 10*3/uL — ABNORMAL LOW (ref 150–400)
RBC: 2.94 MIL/uL — ABNORMAL LOW (ref 4.22–5.81)
RDW: 15.9 % — ABNORMAL HIGH (ref 11.5–15.5)
WBC Count: 3.6 10*3/uL — ABNORMAL LOW (ref 4.0–10.5)
nRBC: 0 % (ref 0.0–0.2)

## 2022-11-04 LAB — CMP (CANCER CENTER ONLY)
ALT: 16 U/L (ref 0–44)
AST: 14 U/L — ABNORMAL LOW (ref 15–41)
Albumin: 4.2 g/dL (ref 3.5–5.0)
Alkaline Phosphatase: 63 U/L (ref 38–126)
Anion gap: 9 (ref 5–15)
BUN: 25 mg/dL — ABNORMAL HIGH (ref 8–23)
CO2: 28 mmol/L (ref 22–32)
Calcium: 9.1 mg/dL (ref 8.9–10.3)
Chloride: 102 mmol/L (ref 98–111)
Creatinine: 1.02 mg/dL (ref 0.61–1.24)
GFR, Estimated: >60 mL/min (ref >60)
Glucose, Bld: 177 mg/dL — ABNORMAL HIGH (ref 70–99)
Potassium: 4.5 mmol/L (ref 3.5–5.1)
Sodium: 139 mmol/L (ref 135–145)
Total Bilirubin: 0.4 mg/dL (ref 0.3–1.2)
Total Protein: 6.7 g/dL (ref 6.5–8.1)

## 2022-11-04 LAB — SAMPLE TO BLOOD BANK

## 2022-11-04 NOTE — Telephone Encounter (Signed)
Called patient per 11/17 in basket. Patient notified of upcoming appointments.

## 2022-11-04 NOTE — Progress Notes (Signed)
CRITICAL VALUE STICKER  CRITICAL VALUE: HGB 9.3, PLT 127  RECEIVER (on-site recipient of call): Rahmir Beever P. LPN  DATE & TIME NOTIFIED: 11/21   MESSENGER (representative from lab): Marsha left voicemail  MD NOTIFIED: Heilingoetter, PA-C  RESPONSE: No transfusion needed.

## 2022-11-11 ENCOUNTER — Telehealth: Payer: Self-pay

## 2022-11-11 ENCOUNTER — Inpatient Hospital Stay: Payer: Medicare Other

## 2022-11-11 ENCOUNTER — Other Ambulatory Visit: Payer: Medicare Other

## 2022-11-11 DIAGNOSIS — C787 Secondary malignant neoplasm of liver and intrahepatic bile duct: Secondary | ICD-10-CM | POA: Diagnosis not present

## 2022-11-11 DIAGNOSIS — C3411 Malignant neoplasm of upper lobe, right bronchus or lung: Secondary | ICD-10-CM | POA: Diagnosis not present

## 2022-11-11 DIAGNOSIS — Z5111 Encounter for antineoplastic chemotherapy: Secondary | ICD-10-CM | POA: Diagnosis not present

## 2022-11-11 DIAGNOSIS — J90 Pleural effusion, not elsewhere classified: Secondary | ICD-10-CM | POA: Diagnosis not present

## 2022-11-11 LAB — CMP (CANCER CENTER ONLY)
ALT: 16 U/L (ref 0–44)
AST: 16 U/L (ref 15–41)
Albumin: 4.1 g/dL (ref 3.5–5.0)
Alkaline Phosphatase: 73 U/L (ref 38–126)
Anion gap: 8 (ref 5–15)
BUN: 19 mg/dL (ref 8–23)
CO2: 28 mmol/L (ref 22–32)
Calcium: 8.5 mg/dL — ABNORMAL LOW (ref 8.9–10.3)
Chloride: 104 mmol/L (ref 98–111)
Creatinine: 1.18 mg/dL (ref 0.61–1.24)
GFR, Estimated: >60 mL/min (ref >60)
Glucose, Bld: 167 mg/dL — ABNORMAL HIGH (ref 70–99)
Potassium: 4.7 mmol/L (ref 3.5–5.1)
Sodium: 140 mmol/L (ref 135–145)
Total Bilirubin: 0.4 mg/dL (ref 0.3–1.2)
Total Protein: 6.8 g/dL (ref 6.5–8.1)

## 2022-11-11 LAB — SAMPLE TO BLOOD BANK

## 2022-11-11 LAB — CBC WITH DIFFERENTIAL (CANCER CENTER ONLY)
Abs Immature Granulocytes: 0.01 10*3/uL (ref 0.00–0.07)
Basophils Absolute: 0 10*3/uL (ref 0.0–0.1)
Basophils Relative: 1 %
Eosinophils Absolute: 0 10*3/uL (ref 0.0–0.5)
Eosinophils Relative: 1 %
HCT: 26.3 % — ABNORMAL LOW (ref 39.0–52.0)
Hemoglobin: 8.4 g/dL — ABNORMAL LOW (ref 13.0–17.0)
Immature Granulocytes: 1 %
Lymphocytes Relative: 24 %
Lymphs Abs: 0.5 10*3/uL — ABNORMAL LOW (ref 0.7–4.0)
MCH: 31 pg (ref 26.0–34.0)
MCHC: 31.9 g/dL (ref 30.0–36.0)
MCV: 97 fL (ref 80.0–100.0)
Monocytes Absolute: 0.4 10*3/uL (ref 0.1–1.0)
Monocytes Relative: 17 %
Neutro Abs: 1.3 10*3/uL — ABNORMAL LOW (ref 1.7–7.7)
Neutrophils Relative %: 56 %
Platelet Count: 54 10*3/uL — ABNORMAL LOW (ref 150–400)
RBC: 2.71 MIL/uL — ABNORMAL LOW (ref 4.22–5.81)
RDW: 17.5 % — ABNORMAL HIGH (ref 11.5–15.5)
WBC Count: 2.2 10*3/uL — ABNORMAL LOW (ref 4.0–10.5)
nRBC: 0 % (ref 0.0–0.2)

## 2022-11-11 NOTE — Telephone Encounter (Signed)
CRITICAL VALUE STICKER  CRITICAL VALUE: HGB 8.4  RECEIVER (on-site recipient of call): Elnathan Fulford P. LPN  DATE & TIME NOTIFIED: 11/11/22 12:20 pm  MESSENGER (representative from lab): Deidre Ala  MD NOTIFIED: Rubin Payor Heilingoetter PA-C

## 2022-11-12 ENCOUNTER — Encounter: Payer: Self-pay | Admitting: Primary Care

## 2022-11-12 ENCOUNTER — Encounter: Payer: Self-pay | Admitting: *Deleted

## 2022-11-12 ENCOUNTER — Ambulatory Visit (INDEPENDENT_AMBULATORY_CARE_PROVIDER_SITE_OTHER): Payer: Medicare Other | Admitting: Primary Care

## 2022-11-12 VITALS — BP 130/70 | HR 88 | Temp 96.8°F | Ht 68.0 in | Wt 300.0 lb

## 2022-11-12 DIAGNOSIS — R42 Dizziness and giddiness: Secondary | ICD-10-CM | POA: Diagnosis not present

## 2022-11-12 NOTE — Progress Notes (Signed)
Subjective:    Patient ID: Adam Wise, male    DOB: 04/02/1951, 71 y.o.   MRN: [id number]  Dizziness Pertinent negatives include no fever or headaches.    Adam Wise is a very pleasant 71 y.o. male with a history of hypertension, OSA, small cell lung cancer, recurrent pleural effusion, chronic respiratory failure, type 2 diabetes who presents today to discuss dizziness.   Symptom onset about 3-4 weeks ago while getting into bed. He laid onto his back and he felt that his feet were going over his head doing flips. Symptoms resolved after a few seconds. Symptoms have continued since which are provoked when laying in bed and rolling onto his side, also when moving his head in different positions (left, right and look upward). During the day he feels off balance. He notices his symptoms with rest and movement.    He can hear his "heartbeat" in his right ear with intermittent pain. He's tried irrigating his right ear and did not see any wax. He's been taking Vertigone for the last week without improvement. He's also tried Meclizine which caused nausea and no improvement in his symptoms.   He denies unilateral weakness, changes in speech, rectal bleeding. His wife denies acute confusion. There have been no changes in his medication regimen.  This morning he passed out on his bed within a few seconds of sitting up on the side of the bed. This occurred one week ago in the same scenario. Both incidences were unwitnessed. He believes he was out for a few seconds. He underwent a full cardiac work up recently and was told "my heart is fine".   He is currently managed on his 16th round of chemotherapy, max dose is typically 10 doses. He questions if his symptoms are secondary to his chemotherapy.   He underwent an MRI of his brain on 09/29/22 which was without acute or new process. No metastatic disease.   He does use Afrin nasal spray daily, has done so for years.   BP Readings from Last 3  Encounters:  11/12/22 130/70  10/30/22 (!) 151/86  10/28/22 131/81      Review of Systems  Constitutional:  Negative for fever.  Gastrointestinal:  Negative for blood in stool.  Neurological:  Positive for dizziness and light-headedness. Negative for headaches.         Past Medical History:  Diagnosis Date   Chickenpox    Chronic knee pain    Chronic low back pain    COPD exacerbation ([place]) 10/11/2021   COPD with exacerbation ([place]) 10/12/2021   Coronary artery calcification seen on CAT scan 11/2021   Coronary CTA 11/27/2021: Moderate to large right pleural effusion and compressive atelectasis right lung base. => Coronary Calcium Score 657.  Diffuse RCA calcification.  Minimal mild disease in the LAD and diagonal branches. == Overall limited study.  Notable artifact.   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 He has disease progression after the first line of chemotherapy in December 2021.   Testosterone deficiency    Type 2 diabetes mellitus ([place])     Social History  Socioeconomic History   Marital status: Married    Spouse name: Not on file   Number of children: Not on file   Years of education: Not on file   Highest education level: Not on file  Occupational History   Not on file  Tobacco Use   Smoking status: Former   Smokeless tobacco: Never  Vaping Use   Vaping Use: Never used  Substance and Sexual Activity   Alcohol use: Not Currently    Comment: rarely   Drug use: Not Currently   Sexual activity: Yes  Other Topics Concern   Not on file  Social  History Narrative   Married.   Moved from [place].   Retired.   Social Determinants of Health   Financial Resource Strain: Low Risk  (02/14/2022)   Overall Financial Resource Strain (CARDIA)    Difficulty of Paying Living Expenses: Not hard at all  Food Insecurity: No Food Insecurity (02/14/2022)   Hunger Vital Sign    Worried About Running Out of Food in the Last Year: Never true    Ran Out of Food in the Last Year: Never true  Transportation Needs: No Transportation Needs (02/14/2022)   PRAPARE - Hydrologist (Medical): No    Lack of Transportation (Non-Medical): No  Physical Activity: Inactive (02/14/2022)   Exercise Vital Sign    Days of Exercise per Week: 0 days    Minutes of Exercise per Session: 0 min  Stress: No Stress Concern Present (02/14/2022)   [place]    Feeling of Stress : Only a little  Social Connections: Moderately Integrated (02/14/2022)   Social Connection and Isolation Panel [NHANES]    Frequency of Communication with Friends and Family: Twice a week    Frequency of Social Gatherings with Friends and Family: Twice a week    Attends Religious Services: 1 to 4 times per year    Active Member of [employer] or Organizations: No    Attends Archivist Meetings: Never    Marital Status: Married  Human resources officer Violence: Not At Risk (02/14/2022)   Humiliation, Afraid, Rape, and Kick questionnaire    Fear of Current or Ex-Partner: No    Emotionally Abused: No    Physically Abused: No    Sexually Abused: No    Past Surgical History:  Procedure Laterality Date   CHEST TUBE INSERTION Right 01/20/2022   Procedure: CHEST TUBE INSERTION;  Surgeon: Candee Furbish, MD;  Location[facility] ENDOSCOPY;  Service: Pulmonary;  Laterality: Right;  w/ Talc Pleurodesis, planned admit for Obs afterwards   CHEST TUBE INSERTION Right 04/28/2022   Procedure: INSERTION PLEURAL DRAINAGE CATHETER - Pigtail  tail, drainage;  Surgeon: Candee Furbish, MD;  Location: [facility] ENDOSCOPY;  Service: Pulmonary;  Laterality: Right;  talc pleurodesis   CHEST TUBE INSERTION Right 09/01/2022   Procedure: INSERTION PLEURAL DRAINAGE CATHETER;  Surgeon: Juanito Doom, MD;  Location: [facility];  Service: Cardiopulmonary;  Laterality: Right;  pigtail catheter placement with talc pleurodesis   COLONOSCOPY WITH PROPOFOL N/A 12/17/2018   Procedure: COLONOSCOPY WITH PROPOFOL;  Surgeon: Virgel Manifold, MD;  Location: [facility] ENDOSCOPY;  Service: Gastroenterology;  Laterality: N/A;   IR IMAGING GUIDED PORT INSERTION  04/17/2020   IR THORACENTESIS ASP PLEURAL SPACE W/IMG GUIDE  11/29/2021   IR THORACENTESIS ASP PLEURAL SPACE W/IMG GUIDE  01/03/2022   JOINT REPLACEMENT Bilateral    REPLACEMENT TOTAL KNEE BILATERAL  2015  TALC PLEURODESIS  09/01/2022   Procedure: Pietro Cassis;  Surgeon: Juanito Doom, MD;  Location: [facility] ENDOSCOPY;  Service: Cardiopulmonary;;   TONSILLECTOMY  1960   TRANSTHORACIC ECHOCARDIOGRAM  05/2020   a) 05/2020: EF 55 to 60%.  No R WMA.  Mild LVH.  Indeterminate LVEDP.  Unable to assess RVP.  Normal aortic and mitral valves.  Mildly elevated RAP.; b) 09/2021: EF 50-55%. No RWMA. Mild LVH. ~ LVEDP. Mild LA Dilation. NORMAL RV/RAP.  Normal MV/AoV.    Family History  Problem Relation Age of Onset   Heart attack Mother    Cancer Mother    Hypertension Mother    Arthritis Father    Asthma Father    Cancer Father    COPD Father    Heart attack Father    Hypertension Sister    Cancer Sister    Diabetes Sister    Asthma Sister    Cancer Sister    COPD Sister    Arthritis Sister    Asthma Sister    Diabetes Sister    Birth defects Maternal Grandfather    Arthritis Paternal Grandmother    Diabetes Paternal Grandmother    Arthritis Paternal Grandfather    Asthma Son    Other Neg Hx        pituitary disorder    Allergies  Allergen Reactions   Bupropion     Racing heart    Hydrochlorothiazide Other (See Comments)    Cramping to lower extremities    Current Outpatient Medications on File Prior to Visit  Medication Sig Dispense Refill   albuterol (VENTOLIN HFA) 108 (90 Base) MCG/ACT inhaler Inhale 2 puffs into the lungs every 6 (six) hours as needed for wheezing or shortness of breath. 18 g 0   amLODipine (NORVASC) 10 MG tablet TAKE 1 TABLET BY MOUTH EVERY DAY FOR BLOOD PRESSURE 90 tablet 3   B-D 3CC LUER-LOK SYR 22GX1" 22G X 1" 3 ML MISC USE AS INSTRUCTED FOR TESTOSTERONE INJECTION EVERY 2 WEEKS 10 each 2   bisoprolol (ZEBETA) 5 MG tablet TAKE 1 TABLET (5 MG TOTAL) BY MOUTH DAILY. 90 tablet 2   Black Pepper-Turmeric (TURMERIC COMPLEX/BLACK PEPPER PO) Take 1 tablet by mouth in the morning and at bedtime.     blood glucose meter kit and supplies KIT Dispense based on patient and insurance preference. Use up to four times daily as directed. 1 each 0   Budeson-Glycopyrrol-Formoterol (BREZTRI AEROSPHERE) 160-9-4.8 MCG/ACT AERO Inhale 2 puffs into the lungs in the morning and at bedtime. 10.7 g 3   chlorpheniramine-HYDROcodone (TUSSIONEX) 10-8 MG/5ML Take 5 mLs by mouth every 12 (twelve) hours as needed for cough. 473 mL 0   Coenzyme Q10 (COQ10) 200 MG CAPS Take 200 mg by mouth at bedtime.     ELIQUIS 5 MG TABS tablet TAKE 1 TABLET BY MOUTH TWICE A DAY 180 tablet 2   esomeprazole (NEXIUM) 20 MG capsule Take 20 mg by mouth in the morning.     gabapentin (NEURONTIN) 300 MG capsule TAKE 1 CAPSULE (300 MG TOTAL) BY MOUTH 2 (TWO) TIMES DAILY. FOR BACK PAIN. 180 capsule 2   glipiZIDE (GLUCOTROL) 10 MG tablet TAKE 1 TABLET (10 MG TOTAL) BY MOUTH 2 (TWO) TIMES DAILY BEFORE A MEAL. FOR DIABETES. 180 tablet 0   glucose blood (ONETOUCH ULTRA) test strip USE UP TO 4 TIMES DAILY AS DIRECTED 400 strip 5   Insulin Glargine (BASAGLAR KWIKPEN) 100 UNIT/ML Inject 24 Units into the skin at bedtime. For  diabetes. 30 mL 1   Insulin Pen Needle (BD PEN NEEDLE NANO U/F) 32G X 4 MM MISC Use with  insulin as prescribed Dx Code: E11.9 200 each 3   ipratropium-albuterol (DUONEB) 0.5-2.5 (3) MG/3ML SOLN Take 3 mLs by nebulization every 4 (four) hours while awake for 3 days, THEN 3 mLs every 4 (four) hours as needed (shortness of breath or wheezing). 360 mL 0   KRILL OIL PO Take 1 capsule by mouth daily.     Lancets MISC USE UP TO 3 TIMES DAILY AS DIRECTED 100 each 2   losartan (COZAAR) 50 MG tablet TAKE 1 TABLET (50 MG TOTAL) BY MOUTH DAILY. FOR BLOOD PRESSURE. 90 tablet 2   Melatonin 10 MG CAPS Take 10 mg by mouth at bedtime.     metFORMIN (GLUCOPHAGE) 1000 MG tablet TAKE 1 TABLET (1,000 MG TOTAL) BY MOUTH 2 (TWO) TIMES DAILY WITH A MEAL. FOR DIABETES 180 tablet 0   Multiple Vitamin (MULTI-VITAMINS) TABS Take 1 tablet by mouth in the morning.     oxymetazoline (AFRIN) 0.05 % nasal spray Place 1 spray into both nostrils daily as needed for congestion.     pioglitazone (ACTOS) 45 MG tablet TAKE 1 TABLET (45 MG TOTAL) BY MOUTH DAILY. FOR DIABETES. 90 tablet 0   pravastatin (PRAVACHOL) 40 MG tablet TAKE 1 TABLET BY MOUTH EVERY DAY IN THE EVENING FOR CHOLESTEROL 90 tablet 2   testosterone cypionate (DEPOTESTOSTERONE CYPIONATE) 200 MG/ML injection Inject 200 mg into the muscle every 14 (fourteen) days.     traMADol (ULTRAM) 50 MG tablet Take 1 tablet (50 mg total) by mouth every 6 (six) hours as needed. 15 tablet 0   meclizine (ANTIVERT) 12.5 MG tablet Take 1 tablet (12.5 mg total) by mouth 3 (three) times daily as needed for dizziness. (Patient not taking: Reported on 11/12/2022) 30 tablet 1   No current facility-administered medications on file prior to visit.    BP 130/70   Pulse 88   Temp (!) 96.8 F (36 C) (Temporal)   Ht _0  (1.727 m)   Wt 300 lb (136.1 kg)   SpO2 97%   BMI 45.61 kg/m  Objective:   Physical Exam HENT:     Right Ear: There is impacted cerumen.     Left Ear: Tympanic membrane and ear canal normal.  Eyes:     Extraocular Movements: Extraocular movements intact.   Cardiovascular:     Rate and Rhythm: Normal rate and regular rhythm.  Pulmonary:     Effort: Pulmonary effort is normal.     Breath sounds: Normal breath sounds.  Skin:    General: Skin is warm and dry.  Neurological:     General: No focal deficit present.     Mental Status: He is alert and oriented to person, place, and time.     Cranial Nerves: No cranial nerve deficit.     Coordination: Coordination normal.           Assessment & Plan:   Problem List Items Addressed This Visit       Other   Dizziness - Primary    Symptoms are most representative of vertigo. Unsure if his chemotherapy treatment is contributing. He will inquire with his oncologist.  Vitals were not orthostatic in the office today, but he was borderline.   Attempted to irrigate right ear with cerumen impaction, only minimal was extracted. He consented to irrigation. Tolerated well. Discussed to use Debrox drops and his irrigation kit a home.  Reviewed MRI brain from October 2023 per oncology. Neuro exam today grossly negative.  Information provided on Epley's maneuvers.  Referral placed to ENT.  Discussed to stop Afrin nasal spray, switch to Flonase.      Relevant Orders   Ambulatory referral to ENT       [name], NP

## 2022-11-12 NOTE — Assessment & Plan Note (Addendum)
Symptoms are most representative of vertigo. Unsure if his chemotherapy treatment is contributing. He will inquire with his oncologist.  Vitals were not orthostatic in the office today, but he was borderline.   Attempted to irrigate right ear with cerumen impaction, only minimal was extracted. He consented to irrigation. Tolerated well. Discussed to use Debrox drops and his irrigation kit a home.  Reviewed MRI brain from October 2023 per oncology. Neuro exam today grossly negative.  Information provided on Epley's maneuvers.  Referral placed to ENT.  Discussed to stop Afrin nasal spray, switch to Flonase.

## 2022-11-12 NOTE — Patient Instructions (Signed)
You will either be contacted via phone regarding your referral to ENT, or you may receive a letter on your MyChart portal from our referral team with instructions for scheduling an appointment. Please let us know if you have not been contacted by anyone within two weeks.  Try the maneuvers below.  Stop using Afrin nasal spray. Switch to Flonase.  It was a pleasure to see you today!   How to Perform the Epley Maneuver The Epley maneuver is an exercise that relieves symptoms of vertigo. Vertigo is the feeling that you or your surroundings are moving when they are not. When you feel vertigo, you may feel like the room is spinning and may have trouble walking. The Epley maneuver is used for a type of vertigo caused by a calcium deposit in a part of the inner ear. The maneuver involves changing head positions to help the deposit move out of the area. You can do this maneuver at home whenever you have symptoms of vertigo. You can repeat it in 24 hours if your vertigo has not gone away. Even though the Epley maneuver may relieve your vertigo for a few weeks, it is possible that your symptoms will return. This maneuver relieves vertigo, but it does not relieve dizziness. What are the risks? If it is done correctly, the Epley maneuver is considered safe. Sometimes it can lead to dizziness or nausea that goes away after a short time. If you develop other symptoms--such as changes in vision, weakness, or numbness--stop doing the maneuver and call your health care provider. Supplies needed: A bed or table. A pillow. How to do the Epley maneuver     Sit on the edge of a bed or table with your back straight and your legs extended or hanging over the edge of the bed or table. Turn your head halfway toward the affected ear or side as told by your health care provider. Lie backward quickly with your head turned until you are lying flat on your back. Your head should dangle (head-hanging position). You may  want to position a pillow under your shoulders. Hold this position for at least 30 seconds. If you feel dizzy or have symptoms of vertigo, continue to hold the position until the symptoms stop. Turn your head to the opposite direction until your unaffected ear is facing down. Your head should continue to dangle. Hold this position for at least 30 seconds. If you feel dizzy or have symptoms of vertigo, continue to hold the position until the symptoms stop. Turn your whole body to the same side as your head so that you are positioned on your side. Your head will now be nearly facedown and no longer needs to dangle. Hold for at least 30 seconds. If you feel dizzy or have symptoms of vertigo, continue to hold the position until the symptoms stop. Sit back up. You can repeat the maneuver in 24 hours if your vertigo does not go away. Follow these instructions at home: For 24 hours after doing the Epley maneuver: Keep your head in an upright position. When lying down to sleep or rest, keep your head raised (elevated) with two or more pillows. Avoid excessive neck movements. Activity Do not drive or use machinery if you feel dizzy. After doing the Epley maneuver, return to your normal activities as told by your health care provider. Ask your health care provider what activities are safe for you. General instructions Drink enough fluid to keep your urine pale yellow. Do not  drink alcohol. Take over-the-counter and prescription medicines only as told by your health care provider. Keep all follow-up visits. This is important. Preventing vertigo symptoms Ask your health care provider if there is anything you should do at home to prevent vertigo. He or she may recommend that you: Keep your head elevated with two or more pillows while you sleep. Do not sleep on the side of your affected ear. Get up slowly from bed. Avoid sudden movements during the day. Avoid extreme head positions or movement, such as  looking up or bending over. Contact a health care provider if: Your vertigo gets worse. You have other symptoms, including: Nausea. Vomiting. Headache. Get help right away if you: Have vision changes. Have a headache or neck pain that is severe or getting worse. Cannot stop vomiting. Have new numbness or weakness in any part of your body. These symptoms may represent a serious problem that is an emergency. Do not wait to see if the symptoms will go away. Get medical help right away. Call your local emergency services (911 in the [place] Do not drive yourself to the hospital. Summary Vertigo is the feeling that you or your surroundings are moving when they are not. The Epley maneuver is an exercise that relieves symptoms of vertigo. If the Epley maneuver is done correctly, it is considered safe. This information is not intended to replace advice given to you by your health care provider. Make sure you discuss any questions you have with your health care provider. Document Revised: 10/31/2020 Document Reviewed: 10/31/2020 Elsevier Patient Education  [employer].

## 2022-11-14 ENCOUNTER — Ambulatory Visit (HOSPITAL_COMMUNITY)
Admission: RE | Admit: 2022-11-14 | Discharge: 2022-11-14 | Disposition: A | Discharge status: Home / Self Care | Payer: Medicare Other | Source: Ambulatory Visit | Attending: Physician Assistant | Admitting: Physician Assistant

## 2022-11-14 DIAGNOSIS — C3490 Malignant neoplasm of unspecified part of unspecified bronchus or lung: Secondary | ICD-10-CM | POA: Insufficient documentation

## 2022-11-14 DIAGNOSIS — R918 Other nonspecific abnormal finding of lung field: Secondary | ICD-10-CM | POA: Diagnosis not present

## 2022-11-14 DIAGNOSIS — K769 Liver disease, unspecified: Secondary | ICD-10-CM | POA: Diagnosis not present

## 2022-11-14 DIAGNOSIS — C3411 Malignant neoplasm of upper lobe, right bronchus or lung: Secondary | ICD-10-CM | POA: Diagnosis not present

## 2022-11-14 DIAGNOSIS — J90 Pleural effusion, not elsewhere classified: Secondary | ICD-10-CM | POA: Diagnosis not present

## 2022-11-14 DIAGNOSIS — N281 Cyst of kidney, acquired: Secondary | ICD-10-CM | POA: Diagnosis not present

## 2022-11-14 MED ORDER — HEPARIN SOD (PORK) LOCK FLUSH 100 UNIT/ML IV SOLN
500.0000 [IU] | Freq: Once | INTRAVENOUS | Status: AC
  Administered 2022-11-14: 500 [IU] via INTRAVENOUS

## 2022-11-14 MED ORDER — IOHEXOL 300 MG/ML  SOLN
100.0000 mL | Freq: Once | INTRAMUSCULAR | Status: AC | PRN
  Administered 2022-11-14: 100 mL via INTRAVENOUS

## 2022-11-17 ENCOUNTER — Telehealth: Payer: Self-pay | Admitting: Internal Medicine

## 2022-11-17 MED FILL — Dexamethasone Sodium Phosphate Inj 100 MG/10ML: INTRAMUSCULAR | Qty: 1 | Status: AC

## 2022-11-17 MED FILL — Fosaprepitant Dimeglumine For IV Infusion 150 MG (Base Eq): INTRAVENOUS | Qty: 5 | Status: AC

## 2022-11-17 NOTE — Telephone Encounter (Signed)
Called patient regarding upcoming December appointments, patient is notified. 

## 2022-11-18 ENCOUNTER — Other Ambulatory Visit: Payer: Medicare Other

## 2022-11-18 ENCOUNTER — Other Ambulatory Visit: Payer: Self-pay

## 2022-11-18 ENCOUNTER — Inpatient Hospital Stay (HOSPITAL_BASED_OUTPATIENT_CLINIC_OR_DEPARTMENT_OTHER): Payer: Medicare Other | Admitting: Internal Medicine

## 2022-11-18 ENCOUNTER — Inpatient Hospital Stay: Payer: Medicare Other | Attending: Internal Medicine

## 2022-11-18 ENCOUNTER — Telehealth: Payer: Self-pay | Admitting: Medical Oncology

## 2022-11-18 ENCOUNTER — Inpatient Hospital Stay: Payer: Medicare Other

## 2022-11-18 VITALS — BP 109/78 | HR 84 | Temp 98.4°F | Resp 16 | Ht 68.0 in | Wt 303.2 lb

## 2022-11-18 VITALS — BP 122/67 | HR 82 | Resp 17

## 2022-11-18 DIAGNOSIS — J449 Chronic obstructive pulmonary disease, unspecified: Secondary | ICD-10-CM | POA: Diagnosis not present

## 2022-11-18 DIAGNOSIS — Z809 Family history of malignant neoplasm, unspecified: Secondary | ICD-10-CM | POA: Diagnosis not present

## 2022-11-18 DIAGNOSIS — Z87891 Personal history of nicotine dependence: Secondary | ICD-10-CM | POA: Diagnosis not present

## 2022-11-18 DIAGNOSIS — Z7984 Long term (current) use of oral hypoglycemic drugs: Secondary | ICD-10-CM | POA: Insufficient documentation

## 2022-11-18 DIAGNOSIS — Z5111 Encounter for antineoplastic chemotherapy: Secondary | ICD-10-CM | POA: Diagnosis not present

## 2022-11-18 DIAGNOSIS — Z7901 Long term (current) use of anticoagulants: Secondary | ICD-10-CM | POA: Diagnosis not present

## 2022-11-18 DIAGNOSIS — I2510 Atherosclerotic heart disease of native coronary artery without angina pectoris: Secondary | ICD-10-CM | POA: Insufficient documentation

## 2022-11-18 DIAGNOSIS — C3411 Malignant neoplasm of upper lobe, right bronchus or lung: Secondary | ICD-10-CM

## 2022-11-18 DIAGNOSIS — Z452 Encounter for adjustment and management of vascular access device: Secondary | ICD-10-CM | POA: Diagnosis not present

## 2022-11-18 DIAGNOSIS — N400 Enlarged prostate without lower urinary tract symptoms: Secondary | ICD-10-CM | POA: Insufficient documentation

## 2022-11-18 DIAGNOSIS — I700 Atherosclerosis of aorta: Secondary | ICD-10-CM | POA: Insufficient documentation

## 2022-11-18 DIAGNOSIS — K219 Gastro-esophageal reflux disease without esophagitis: Secondary | ICD-10-CM | POA: Diagnosis not present

## 2022-11-18 DIAGNOSIS — E785 Hyperlipidemia, unspecified: Secondary | ICD-10-CM | POA: Insufficient documentation

## 2022-11-18 DIAGNOSIS — Z79899 Other long term (current) drug therapy: Secondary | ICD-10-CM | POA: Diagnosis not present

## 2022-11-18 DIAGNOSIS — J90 Pleural effusion, not elsewhere classified: Secondary | ICD-10-CM | POA: Diagnosis not present

## 2022-11-18 DIAGNOSIS — K5730 Diverticulosis of large intestine without perforation or abscess without bleeding: Secondary | ICD-10-CM | POA: Insufficient documentation

## 2022-11-18 DIAGNOSIS — E119 Type 2 diabetes mellitus without complications: Secondary | ICD-10-CM | POA: Diagnosis not present

## 2022-11-18 DIAGNOSIS — Z794 Long term (current) use of insulin: Secondary | ICD-10-CM | POA: Insufficient documentation

## 2022-11-18 DIAGNOSIS — C787 Secondary malignant neoplasm of liver and intrahepatic bile duct: Secondary | ICD-10-CM | POA: Insufficient documentation

## 2022-11-18 DIAGNOSIS — G4733 Obstructive sleep apnea (adult) (pediatric): Secondary | ICD-10-CM | POA: Diagnosis not present

## 2022-11-18 DIAGNOSIS — C3481 Malignant neoplasm of overlapping sites of right bronchus and lung: Secondary | ICD-10-CM | POA: Diagnosis not present

## 2022-11-18 DIAGNOSIS — I10 Essential (primary) hypertension: Secondary | ICD-10-CM | POA: Diagnosis not present

## 2022-11-18 DIAGNOSIS — Z95828 Presence of other vascular implants and grafts: Secondary | ICD-10-CM

## 2022-11-18 LAB — CMP (CANCER CENTER ONLY)
ALT: 12 U/L (ref 0–44)
AST: 15 U/L (ref 15–41)
Albumin: 3.8 g/dL (ref 3.5–5.0)
Alkaline Phosphatase: 74 U/L (ref 38–126)
Anion gap: 8 (ref 5–15)
BUN: 20 mg/dL (ref 8–23)
CO2: 28 mmol/L (ref 22–32)
Calcium: 8.3 mg/dL — ABNORMAL LOW (ref 8.9–10.3)
Chloride: 103 mmol/L (ref 98–111)
Creatinine: 1.18 mg/dL (ref 0.61–1.24)
GFR, Estimated: >60 mL/min (ref >60)
Glucose, Bld: 183 mg/dL — ABNORMAL HIGH (ref 70–99)
Potassium: 4 mmol/L (ref 3.5–5.1)
Sodium: 139 mmol/L (ref 135–145)
Total Bilirubin: 0.3 mg/dL (ref 0.3–1.2)
Total Protein: 6.1 g/dL — ABNORMAL LOW (ref 6.5–8.1)

## 2022-11-18 LAB — CBC WITH DIFFERENTIAL (CANCER CENTER ONLY)
Abs Immature Granulocytes: 0.04 10*3/uL (ref 0.00–0.07)
Basophils Absolute: 0 10*3/uL (ref 0.0–0.1)
Basophils Relative: 1 %
Eosinophils Absolute: 0.1 10*3/uL (ref 0.0–0.5)
Eosinophils Relative: 2 %
HCT: 25.9 % — ABNORMAL LOW (ref 39.0–52.0)
Hemoglobin: 8.3 g/dL — ABNORMAL LOW (ref 13.0–17.0)
Immature Granulocytes: 1 %
Lymphocytes Relative: 17 %
Lymphs Abs: 0.5 10*3/uL — ABNORMAL LOW (ref 0.7–4.0)
MCH: 31.4 pg (ref 26.0–34.0)
MCHC: 32 g/dL (ref 30.0–36.0)
MCV: 98.1 fL (ref 80.0–100.0)
Monocytes Absolute: 0.5 10*3/uL (ref 0.1–1.0)
Monocytes Relative: 18 %
Neutro Abs: 1.7 10*3/uL (ref 1.7–7.7)
Neutrophils Relative %: 61 %
Platelet Count: 173 10*3/uL (ref 150–400)
RBC: 2.64 MIL/uL — ABNORMAL LOW (ref 4.22–5.81)
RDW: 18.4 % — ABNORMAL HIGH (ref 11.5–15.5)
WBC Count: 2.9 10*3/uL — ABNORMAL LOW (ref 4.0–10.5)
nRBC: 1 % — ABNORMAL HIGH (ref 0.0–0.2)

## 2022-11-18 MED ORDER — SODIUM CHLORIDE 0.9 % IV SOLN: Freq: Once | INTRAVENOUS | Status: AC

## 2022-11-18 MED ORDER — TRILACICLIB DIHYDROCHLORIDE INJECTION 300 MG
240.0000 mg/m2 | Freq: Once | INTRAVENOUS | Status: AC
  Administered 2022-11-18: 600 mg via INTRAVENOUS
  Filled 2022-11-18: qty 40

## 2022-11-18 MED ORDER — FAMOTIDINE IN NACL 20-0.9 MG/50ML-% IV SOLN
20.0000 mg | Freq: Once | INTRAVENOUS | Status: AC
  Administered 2022-11-18: 20 mg via INTRAVENOUS
  Filled 2022-11-18: qty 50

## 2022-11-18 MED ORDER — SODIUM CHLORIDE 0.9 % IV SOLN
10.0000 mg | Freq: Once | INTRAVENOUS | Status: AC
  Administered 2022-11-18: 10 mg via INTRAVENOUS
  Filled 2022-11-18: qty 10

## 2022-11-18 MED ORDER — SODIUM CHLORIDE 0.9 % IV SOLN
80.0000 mg/m2 | Freq: Once | INTRAVENOUS | Status: AC
  Administered 2022-11-18: 210 mg via INTRAVENOUS
  Filled 2022-11-18: qty 10.5

## 2022-11-18 MED ORDER — SODIUM CHLORIDE 0.9% FLUSH
10.0000 mL | Freq: Once | INTRAVENOUS | Status: AC
  Administered 2022-11-18: 10 mL

## 2022-11-18 MED ORDER — PALONOSETRON HCL INJECTION 0.25 MG/5ML
0.2500 mg | Freq: Once | INTRAVENOUS | Status: AC
  Administered 2022-11-18: 0.25 mg via INTRAVENOUS
  Filled 2022-11-18: qty 5

## 2022-11-18 MED ORDER — SODIUM CHLORIDE 0.9 % IV SOLN
530.0000 mg | Freq: Once | INTRAVENOUS | Status: AC
  Administered 2022-11-18: 530 mg via INTRAVENOUS
  Filled 2022-11-18: qty 53

## 2022-11-18 MED ORDER — HEPARIN SOD (PORK) LOCK FLUSH 100 UNIT/ML IV SOLN
500.0000 [IU] | Freq: Once | INTRAVENOUS | Status: AC | PRN
  Administered 2022-11-18: 500 [IU]

## 2022-11-18 MED ORDER — SODIUM CHLORIDE 0.9% FLUSH
10.0000 mL | INTRAVENOUS | Status: DC | PRN
  Administered 2022-11-18: 10 mL

## 2022-11-18 MED ORDER — SODIUM CHLORIDE 0.9 % IV SOLN
150.0000 mg | Freq: Once | INTRAVENOUS | Status: AC
  Administered 2022-11-18: 150 mg via INTRAVENOUS
  Filled 2022-11-18: qty 150

## 2022-11-18 MED FILL — Dexamethasone Sodium Phosphate Inj 100 MG/10ML: INTRAMUSCULAR | Qty: 1 | Status: AC

## 2022-11-18 NOTE — Patient Instructions (Signed)
[facility]  Discharge Instructions: Thank you for choosing [facility] to provide your oncology and hematology care.   If you have a lab appointment with the [facility], please go directly to the [facility] and check in at the registration area.   Wear comfortable clothing and clothing appropriate for easy access to any Portacath or PICC line.   We strive to give you quality time with your provider. You may need to reschedule your appointment if you arrive late (15 or more minutes).  Arriving late affects you and other patients whose appointments are after yours.  Also, if you miss three or more appointments without notifying the office, you may be dismissed from the clinic at the provider's discretion.      For prescription refill requests, have your pharmacy contact our office and allow 72 hours for refills to be completed.    Today you received the following chemotherapy and/or immunotherapy agents: Cosela, Etoposide, carboplatin   To help prevent nausea and vomiting after your treatment, we encourage you to take your nausea medication as directed.  BELOW ARE SYMPTOMS THAT SHOULD BE REPORTED IMMEDIATELY: *FEVER GREATER THAN 100.4 F (38 C) OR HIGHER *CHILLS OR SWEATING *NAUSEA AND VOMITING THAT IS NOT CONTROLLED WITH YOUR NAUSEA MEDICATION *UNUSUAL SHORTNESS OF BREATH *UNUSUAL BRUISING OR BLEEDING *URINARY PROBLEMS (pain or burning when urinating, or frequent urination) *BOWEL PROBLEMS (unusual diarrhea, constipation, pain near the anus) TENDERNESS IN MOUTH AND THROAT WITH OR WITHOUT PRESENCE OF ULCERS (sore throat, sores in mouth, or a toothache) UNUSUAL RASH, SWELLING OR PAIN  UNUSUAL VAGINAL DISCHARGE OR ITCHING   Items with * indicate a potential emergency and should be followed up as soon as possible or go to the Emergency Department if any problems should occur.  Please show the CHEMOTHERAPY ALERT CARD or IMMUNOTHERAPY ALERT  CARD at check-in to the Emergency Department and triage nurse.  Should you have questions after your visit or need to cancel or reschedule your appointment, please contact [facility]  Dept: [phone]  and follow the prompts.  Office hours are 8:00 a.m. to 4:30 p.m. Monday - Friday. Please note that voicemails left after 4:00 p.m. may not be returned until the following business day.  We are closed weekends and major holidays. You have access to a nurse at all times for urgent questions. Please call the main number to the clinic Dept: [phone] and follow the prompts.   For any non-urgent questions, you may also contact your provider using MyChart. We now offer e-Visits for anyone 57 and older to request care online for non-urgent symptoms. For details visit [email or web address].   Also download the MyChart app! Go to the app store, search "MyChart", open the app, select [facility], and log in with your MyChart username and password.  Masks are optional in the cancer centers. If you would like for your care team to wear a mask while they are taking care of you, please let them know. You may have one support person who is at least 72 years old accompany you for your appointments.

## 2022-11-18 NOTE — Patient Instructions (Signed)

## 2022-11-18 NOTE — Telephone Encounter (Signed)
LVM to [facility], Thoracic Oncology to return my call.

## 2022-11-18 NOTE — Progress Notes (Signed)
Adam Wise [phone]   Fax:([phone]) [phone]  OFFICE PROGRESS NOTE  [name], NP [place] [address]  DIAGNOSIS: Relapsed extensive stage (T4, N3, M1c)  small cell lung cancer diagnosed in April 2021 and presented with large right upper lobe/right hilar mass with ipsilateral and contralateral mediastinal and right supraclavicular lymphadenopathy in addition to multiple liver lesions. He has disease progression after the first line of chemotherapy in December 2021.  PRIOR THERAPY: 1) Palliative radiotherapy to the right upper lobe lung mass under the care of Dr. Lisbeth Renshaw. 2) Systemic chemotherapy with carboplatin for AUC of 5 on day 1, etoposide 100 mg/M2 on days 1, 2 and 3 in addition to Imfinzi 1500 mg IV every 3 weeks with chemotherapy then every 4 weeks for maintenance if the patient has no evidence for progression.  He will also receive Cosela 240 mg/m2 on the days of the chemotherapy.  Status post 9 cycles.  Starting from cycle #5 the patient will be on maintenance treatment with immunotherapy with Imfinzi 1500 mg IV every 4 weeks. Last dose of chemotherapy was given on November 13, 2020. This treatment was discontinued secondary to disease progression 3) Systemic chemotherapy with carboplatin for AUC of 5 on day 1, etoposide 100 mg/M2 on days 1, 2 and 3, Tecentriq 1200 mg IV every 3 weeks as well as Cosela 250 mg/M2 on the days of the chemotherapy every 3 weeks.  First dose December 18, 2020.  Status post 8 cycles. 4) Zepzelca (lurbinectedin) 3.2 mgm2 IV every 3 weeks. Last dose on 01/23/22. Status post 12 cycles.  5) Palliative systemic chemotherapy with irinotecan 65 mg/m2 on days 1 and 8 IV every 3 weeks.  Status post 3 cycles.  Last dose was given April 01, 2022 discontinued secondary to disease progression   CURRENT THERAPY: systemic chemotherapy with carboplatin for AUC of 5 on day 1, etoposide 100 Mg/M2 on days 1, 2 and 3 with  Cosela before the chemotherapy.  First dose expected to start on 05/28/2022.  Status post 8 cycles.  Starting from cycle #5 his carboplatin will be reduced to AUC of 4 and 2 etoposide 80 Mg/M2.  INTERVAL HISTORY: Lori Liew 71 y.o. male returns to the clinic today for follow-up visit accompanied by his wife.  The patient is feeling fine today with no concerning complaints except for occasional shortness of breath with exertion.  He denied having any current chest pain, cough or hemoptysis.  He has no nausea, vomiting, diarrhea or constipation.  He has no headache or visual changes.  He denied having any significant weight loss or night sweats.  He has been tolerating his systemic chemotherapy fairly well.  He had repeat CT scan of the chest, abdomen and pelvis performed recently and he is here for evaluation and discussion of his scan results.  MEDICAL HISTORY: Past Medical History:  Diagnosis Date   Chickenpox    Chronic knee pain    Chronic low back pain    COPD exacerbation ([facility]) 10/11/2021   COPD with exacerbation ([facility]) 10/12/2021   Coronary artery calcification seen on CAT scan 11/2021   Coronary CTA 11/27/2021: Moderate to large right pleural effusion and compressive atelectasis right lung base. => Coronary Calcium Score 657.  Diffuse RCA calcification.  Minimal mild disease in the LAD and diagonal branches. == Overall limited study.  Notable artifact.   Essential hypertension    GERD (gastroesophageal reflux disease)    Hyperlipidemia  Malignant neoplasm of upper lobe of right lung ([facility]) 04/04/2020   OSA (obstructive sleep apnea)    With nighttime oxygen supplementation   T4, M3, M1 C Metastatic Small Cell Lung Cancer 03/2020   large right upper lobe/right hilar mass with ipsilateral and contralateral mediastinal and right supraclavicular lymphadenopathy in addition to multiple liver lesios. He has disease progression after the first line of chemotherapy in December 2021.    Testosterone deficiency    Type 2 diabetes mellitus (HCC)     ALLERGIES:  is allergic to bupropion and hydrochlorothiazide.  MEDICATIONS:  Current Outpatient Medications  Medication Sig Dispense Refill   albuterol (VENTOLIN HFA) 108 (90 Base) MCG/ACT inhaler Inhale 2 puffs into the lungs every 6 (six) hours as needed for wheezing or shortness of breath. 18 g 0   amLODipine (NORVASC) 10 MG tablet TAKE 1 TABLET BY MOUTH EVERY DAY FOR BLOOD PRESSURE 90 tablet 3   B-D 3CC LUER-LOK SYR 22GX1" 22G X 1" 3 ML MISC USE AS INSTRUCTED FOR TESTOSTERONE INJECTION EVERY 2 WEEKS 10 each 2   bisoprolol (ZEBETA) 5 MG tablet TAKE 1 TABLET (5 MG TOTAL) BY MOUTH DAILY. 90 tablet 2   Black Pepper-Turmeric (TURMERIC COMPLEX/BLACK PEPPER PO) Take 1 tablet by mouth in the morning and at bedtime.     blood glucose meter kit and supplies KIT Dispense based on patient and insurance preference. Use up to four times daily as directed. 1 each 0   Budeson-Glycopyrrol-Formoterol (BREZTRI AEROSPHERE) 160-9-4.8 MCG/ACT AERO Inhale 2 puffs into the lungs in the morning and at bedtime. 10.7 g 3   chlorpheniramine-HYDROcodone (TUSSIONEX) 10-8 MG/5ML Take 5 mLs by mouth every 12 (twelve) hours as needed for cough. 473 mL 0   Coenzyme Q10 (COQ10) 200 MG CAPS Take 200 mg by mouth at bedtime.     ELIQUIS 5 MG TABS tablet TAKE 1 TABLET BY MOUTH TWICE A DAY 180 tablet 2   esomeprazole (NEXIUM) 20 MG capsule Take 20 mg by mouth in the morning.     gabapentin (NEURONTIN) 300 MG capsule TAKE 1 CAPSULE (300 MG TOTAL) BY MOUTH 2 (TWO) TIMES DAILY. FOR BACK PAIN. 180 capsule 2   glipiZIDE (GLUCOTROL) 10 MG tablet TAKE 1 TABLET (10 MG TOTAL) BY MOUTH 2 (TWO) TIMES DAILY BEFORE A MEAL. FOR DIABETES. 180 tablet 0   glucose blood (ONETOUCH ULTRA) test strip USE UP TO 4 TIMES DAILY AS DIRECTED 400 strip 5   Insulin Glargine (BASAGLAR KWIKPEN) 100 UNIT/ML Inject 24 Units into the skin at bedtime. For diabetes. 30 mL 1   Insulin Pen Needle (BD PEN  NEEDLE NANO U/F) 32G X 4 MM MISC Use with insulin as prescribed Dx Code: E11.9 200 each 3   ipratropium-albuterol (DUONEB) 0.5-2.5 (3) MG/3ML SOLN Take 3 mLs by nebulization every 4 (four) hours while awake for 3 days, THEN 3 mLs every 4 (four) hours as needed (shortness of breath or wheezing). 360 mL 0   KRILL OIL PO Take 1 capsule by mouth daily.     Lancets MISC USE UP TO 3 TIMES DAILY AS DIRECTED 100 each 2   losartan (COZAAR) 50 MG tablet TAKE 1 TABLET (50 MG TOTAL) BY MOUTH DAILY. FOR BLOOD PRESSURE. 90 tablet 2   meclizine (ANTIVERT) 12.5 MG tablet Take 1 tablet (12.5 mg total) by mouth 3 (three) times daily as needed for dizziness. (Patient not taking: Reported on 11/12/2022) 30 tablet 1   Melatonin 10 MG CAPS Take 10 mg by mouth at bedtime.  metFORMIN (GLUCOPHAGE) 1000 MG tablet TAKE 1 TABLET (1,000 MG TOTAL) BY MOUTH 2 (TWO) TIMES DAILY WITH A MEAL. FOR DIABETES 180 tablet 0   Multiple Vitamin (MULTI-VITAMINS) TABS Take 1 tablet by mouth in the morning.     oxymetazoline (AFRIN) 0.05 % nasal spray Place 1 spray into both nostrils daily as needed for congestion.     pioglitazone (ACTOS) 45 MG tablet TAKE 1 TABLET (45 MG TOTAL) BY MOUTH DAILY. FOR DIABETES. 90 tablet 0   pravastatin (PRAVACHOL) 40 MG tablet TAKE 1 TABLET BY MOUTH EVERY DAY IN THE EVENING FOR CHOLESTEROL 90 tablet 2   testosterone cypionate (DEPOTESTOSTERONE CYPIONATE) 200 MG/ML injection Inject 200 mg into the muscle every 14 (fourteen) days.     traMADol (ULTRAM) 50 MG tablet Take 1 tablet (50 mg total) by mouth every 6 (six) hours as needed. 15 tablet 0   No current facility-administered medications for this visit.    SURGICAL HISTORY:  Past Surgical History:  Procedure Laterality Date   CHEST TUBE INSERTION Right 01/20/2022   Procedure: CHEST TUBE INSERTION;  Surgeon: Candee Furbish, MD;  Location: [facility] ENDOSCOPY;  Service: Pulmonary;  Laterality: Right;  w/ Talc Pleurodesis, planned admit for Obs afterwards   CHEST  TUBE INSERTION Right 04/28/2022   Procedure: INSERTION PLEURAL DRAINAGE CATHETER - Pigtail tail, drainage;  Surgeon: Candee Furbish, MD;  Location: [facility] ENDOSCOPY;  Service: Pulmonary;  Laterality: Right;  talc pleurodesis   CHEST TUBE INSERTION Right 09/01/2022   Procedure: INSERTION PLEURAL DRAINAGE CATHETER;  Surgeon: Juanito Doom, MD;  Location: [place];  Service: Cardiopulmonary;  Laterality: Right;  pigtail catheter placement with talc pleurodesis   COLONOSCOPY WITH PROPOFOL N/A 12/17/2018   Procedure: COLONOSCOPY WITH PROPOFOL;  Surgeon: Virgel Manifold, MD;  Location: [facility] ENDOSCOPY;  Service: Gastroenterology;  Laterality: N/A;   IR IMAGING GUIDED PORT INSERTION  04/17/2020   IR THORACENTESIS ASP PLEURAL SPACE W/IMG GUIDE  11/29/2021   IR THORACENTESIS ASP PLEURAL SPACE W/IMG GUIDE  01/03/2022   JOINT REPLACEMENT Bilateral    REPLACEMENT TOTAL KNEE BILATERAL  2015   TALC PLEURODESIS  09/01/2022   Procedure: Pietro Cassis;  Surgeon: Juanito Doom, MD;  Location: [facility] ENDOSCOPY;  Service: Cardiopulmonary;;   [place] ECHOCARDIOGRAM  05/2020   a) 05/2020: EF 55 to 60%.  No R WMA.  Mild LVH.  Indeterminate LVEDP.  Unable to assess RVP.  Normal aortic and mitral valves.  Mildly elevated RAP.; b) 09/2021: EF 50-55%. No RWMA. Mild LVH. ~ LVEDP. Mild LA Dilation. NORMAL RV/RAP.  Normal MV/AoV.    REVIEW OF SYSTEMS:  Constitutional: positive for fatigue Eyes: negative Ears, nose, mouth, throat, and face: negative Respiratory: positive for dyspnea on exertion Cardiovascular: negative Gastrointestinal: negative Genitourinary:negative Integument/breast: negative Hematologic/lymphatic: negative Musculoskeletal:negative Neurological: negative Behavioral/Psych: negative Endocrine: negative Allergic/Immunologic: negative   PHYSICAL EXAMINATION: General appearance: alert, cooperative, fatigued, and no distress Head: Normocephalic, without obvious  abnormality, atraumatic Neck: no adenopathy, no JVD, supple, symmetrical, trachea midline, and thyroid not enlarged, symmetric, no tenderness/mass/nodules Lymph nodes: Cervical, supraclavicular, and axillary nodes normal. Resp: clear to auscultation bilaterally Back: symmetric, no curvature. ROM normal. No CVA tenderness. Cardio: regular rate and rhythm, S1, S2 normal, no murmur, click, rub or gallop GI: soft, non-tender; bowel sounds normal; no masses,  no organomegaly Extremities: extremities normal, atraumatic, no cyanosis or edema Neurologic: Alert and oriented X 3, normal strength and tone. Normal symmetric reflexes. Normal coordination and gait  ECOG PERFORMANCE STATUS:  1 - Symptomatic but completely ambulatory  Blood pressure 109/78, pulse 84, temperature 98.4 F (36.9 C), temperature source Oral, resp. rate 16, height _0  (1.727 m), weight (!) 303 lb 3.2 oz (137.5 kg), SpO2 96 %.   LABORATORY DATA: Lab Results  Component Value Date   WBC 2.9 (L) 11/18/2022   HGB 8.3 (L) 11/18/2022   HCT 25.9 (L) 11/18/2022   MCV 98.1 11/18/2022   PLT 173 11/18/2022      Chemistry      Component Value Date/Time   NA 140 11/11/2022 1209   K 4.7 11/11/2022 1209   CL 104 11/11/2022 1209   CO2 28 11/11/2022 1209   BUN 19 11/11/2022 1209   CREATININE 1.18 11/11/2022 1209   CREATININE 1.45 (H) 10/18/2021 1533      Component Value Date/Time   CALCIUM 8.5 (L) 11/11/2022 1209   ALKPHOS 73 11/11/2022 1209   AST 16 11/11/2022 1209   ALT 16 11/11/2022 1209   BILITOT 0.4 11/11/2022 1209       RADIOGRAPHIC STUDIES:    ASSESSMENT AND PLAN: This is a very pleasant 71 year old Caucasian male diagnosed with extensive stage (T4, N3, M1C) small cell lung cancer presented with large right upper lobe lung mass in addition to mediastinal and right supraclavicular lymphadenopathy and multiple metastatic liver lesions diagnosed in April 2021. The patient initially underwent systemic chemotherapy  with carboplatin for an AUC of 5 on day 1, etoposide 100 mg per metered squared on days 1, 2, and 3 in addition to [facility] for myeloprotection.  He also received immunotherapy with Imfinzi on day 1 of every chemotherapy cycle.  He is status post 9 cycles.  Starting from cycle #5 he was on single agent immunotherapy with Imfinzi IV every 4 weeks.  The patient had evidence of disease progression on his scan from December 2021.  He was then started on systemic chemotherapy with carboplatin for an AUC of 5 on day 1, etoposide 100 mg per metered squared on days 1, 2, and 3 in addition to Tecentriq 1200 mg on day 1, the patient is status post 9 cycles.  Starting from cycle #5, the patient started maintenance immunotherapy with Tecentriq.  This was discontinued due to evidence of disease progression. The patient started third line treatment with Zepzelca (lurbinectedin) 3.2 Mg/M2 every 3-week status post 12 cycles of this treatment and he has been tolerating it fairly well except for fatigue.  This treatment was discontinued secondary to disease progression. He underwent fourth line treatment with systemic chemotherapy with irinotecan 65 Mg/M2 on days 1 and 8 every 3 weeks.  Status post 3 cycles.  He tolerated this treatment well with no concerning adverse effect except for mild fatigue. Unfortunately his scan showed evidence for disease progression especially in the liver. I recommended for the patient to discontinue his current treatment with irinotecan at this point. He started systemic chemotherapy again with carboplatin for AUC of 5 on day 1, etoposide 100 Mg/M2 on days 1, 2 and 3 with Cosela status post 8 cycles. I did reduce the dose of carboplatin to AUC of 4 and etoposide 80 Mg/M2 starting from cycle #5.  The patient has been tolerating this treatment fairly well with no concerning adverse effect except for fatigue. He had repeat CT scan of the chest, abdomen and pelvis performed recently.  I personally  and independently reviewed the scan images and discussed the result and showed the images to the patient today. His scan showed evidence  for disease progression in one of the liver lesion. I had a lengthy discussion with the patient about his condition and treatment options. I strongly recommend for the patient to see Dr. Jerline Pain at [facility] for consideration of enrollment in the clinical trial with the [employer].  He was not eligible for this trial. I will continue his current treatment with carboplatin and etoposide for now until the patient has another better option. I will also refer the patient to radiation oncology for consideration of of SBRT to the enlarging liver metastasis if possible. I will see the patient back for follow-up visit in 3 weeks for evaluation before the next cycle of his treatment. The patient was advised to call immediately if he has any other concerning symptoms in the interval. The patient voices understanding of current disease status and treatment options and is in agreement with the current care plan.  All questions were answered. The patient knows to call the clinic with any problems, questions or concerns. We can certainly see the patient much sooner if necessary.  The total time spent in the appointment was 35 minutes.  Disclaimer: This note was dictated with voice recognition software. Similar sounding words can inadvertently be transcribed and may not be corrected upon review. Eilleen Kempf, MD 11/18/22

## 2022-11-19 ENCOUNTER — Inpatient Hospital Stay: Payer: Medicare Other

## 2022-11-19 VITALS — BP 115/65 | HR 85 | Temp 98.0°F | Resp 17

## 2022-11-19 DIAGNOSIS — C3411 Malignant neoplasm of upper lobe, right bronchus or lung: Secondary | ICD-10-CM

## 2022-11-19 DIAGNOSIS — Z5111 Encounter for antineoplastic chemotherapy: Secondary | ICD-10-CM | POA: Diagnosis not present

## 2022-11-19 DIAGNOSIS — J90 Pleural effusion, not elsewhere classified: Secondary | ICD-10-CM | POA: Diagnosis not present

## 2022-11-19 DIAGNOSIS — C787 Secondary malignant neoplasm of liver and intrahepatic bile duct: Secondary | ICD-10-CM | POA: Diagnosis not present

## 2022-11-19 DIAGNOSIS — Z7984 Long term (current) use of oral hypoglycemic drugs: Secondary | ICD-10-CM | POA: Diagnosis not present

## 2022-11-19 DIAGNOSIS — Z79899 Other long term (current) drug therapy: Secondary | ICD-10-CM | POA: Diagnosis not present

## 2022-11-19 MED ORDER — SODIUM CHLORIDE 0.9% FLUSH
10.0000 mL | INTRAVENOUS | Status: DC | PRN
  Administered 2022-11-19: 10 mL

## 2022-11-19 MED ORDER — SODIUM CHLORIDE 0.9 % IV SOLN
10.0000 mg | Freq: Once | INTRAVENOUS | Status: AC
  Administered 2022-11-19: 10 mg via INTRAVENOUS
  Filled 2022-11-19: qty 10

## 2022-11-19 MED ORDER — SODIUM CHLORIDE 0.9 % IV SOLN: Freq: Once | INTRAVENOUS | Status: AC

## 2022-11-19 MED ORDER — TRILACICLIB DIHYDROCHLORIDE INJECTION 300 MG
240.0000 mg/m2 | Freq: Once | INTRAVENOUS | Status: AC
  Administered 2022-11-19: 600 mg via INTRAVENOUS
  Filled 2022-11-19: qty 40

## 2022-11-19 MED ORDER — SODIUM CHLORIDE 0.9 % IV SOLN
80.0000 mg/m2 | Freq: Once | INTRAVENOUS | Status: AC
  Administered 2022-11-19: 210 mg via INTRAVENOUS
  Filled 2022-11-19: qty 10.5

## 2022-11-19 MED FILL — Dexamethasone Sodium Phosphate Inj 100 MG/10ML: INTRAMUSCULAR | Qty: 1 | Status: AC

## 2022-11-19 NOTE — Patient Instructions (Signed)
[facility]COL[facility]  Discharge Instructions: Thank you for choosing [facility] to provide your oncology and hematology care.   If you have a lab appointment with the [facility], please go directly to the [facility] and check in at the registration area.   Wear comfortable clothing and clothing appropriate for easy access to any Portacath or PICC line.   We strive to give you quality time with your provider. You may need to reschedule your appointment if you arrive late (15 or more minutes).  Arriving late affects you and other patients whose appointments are after yours.  Also, if you miss three or more appointments without notifying the office, you may be dismissed from the clinic at the provider's discretion.      For prescription refill requests, have your pharmacy contact our office and allow 72 hours for refills to be completed.    Today you received the following chemotherapy and/or immunotherapy agents: Cosela, Etoposide   To help prevent nausea and vomiting after your treatment, we encourage you to take your nausea medication as directed.  BELOW ARE SYMPTOMS THAT SHOULD BE REPORTED IMMEDIATELY: *FEVER GREATER THAN 100.4 F (38 C) OR HIGHER *CHILLS OR SWEATING *NAUSEA AND VOMITING THAT IS NOT CONTROLLED WITH YOUR NAUSEA MEDICATION *UNUSUAL SHORTNESS OF BREATH *UNUSUAL BRUISING OR BLEEDING *URINARY PROBLEMS (pain or burning when urinating, or frequent urination) *BOWEL PROBLEMS (unusual diarrhea, constipation, pain near the anus) TENDERNESS IN MOUTH AND THROAT WITH OR WITHOUT PRESENCE OF ULCERS (sore throat, sores in mouth, or a toothache) UNUSUAL RASH, SWELLING OR PAIN  UNUSUAL VAGINAL DISCHARGE OR ITCHING   Items with * indicate a potential emergency and should be followed up as soon as possible or go to the Emergency Department if any problems should occur.  Please show the CHEMOTHERAPY ALERT CARD or IMMUNOTHERAPY ALERT CARD at  check-in to the Emergency Department and triage nurse.  Should you have questions after your visit or need to cancel or reschedule your appointment, please contact [facility]  Dept: [phone]  and follow the prompts.  Office hours are 8:00 a.m. to 4:30 p.m. Monday - Friday. Please note that voicemails left after 4:00 p.m. may not be returned until the following business day.  We are closed weekends and major holidays. You have access to a nurse at all times for urgent questions. Please call the main number to the clinic Dept: [phone] and follow the prompts.   For any non-urgent questions, you may also contact your provider using MyChart. We now offer e-Visits for anyone 72 and older to request care online for non-urgent symptoms. For details visit [email or web address].   Also download the MyChart app! Go to the app store, search "MyChart", open the app, select [facility], and log in with your MyChart username and password.  Masks are optional in the cancer centers. If you would like for your care team to wear a mask while they are taking care of you, please let them know. You may have one support person who is at least 72 years old accompany you for your appointments.

## 2022-11-20 ENCOUNTER — Inpatient Hospital Stay: Payer: Medicare Other

## 2022-11-20 ENCOUNTER — Other Ambulatory Visit: Payer: Self-pay

## 2022-11-20 VITALS — BP 143/78 | HR 100 | Temp 97.6°F | Resp 18

## 2022-11-20 DIAGNOSIS — C3411 Malignant neoplasm of upper lobe, right bronchus or lung: Secondary | ICD-10-CM | POA: Diagnosis not present

## 2022-11-20 DIAGNOSIS — J90 Pleural effusion, not elsewhere classified: Secondary | ICD-10-CM | POA: Diagnosis not present

## 2022-11-20 DIAGNOSIS — Z79899 Other long term (current) drug therapy: Secondary | ICD-10-CM | POA: Diagnosis not present

## 2022-11-20 DIAGNOSIS — Z5111 Encounter for antineoplastic chemotherapy: Secondary | ICD-10-CM | POA: Diagnosis not present

## 2022-11-20 DIAGNOSIS — Z7984 Long term (current) use of oral hypoglycemic drugs: Secondary | ICD-10-CM | POA: Diagnosis not present

## 2022-11-20 DIAGNOSIS — C787 Secondary malignant neoplasm of liver and intrahepatic bile duct: Secondary | ICD-10-CM | POA: Diagnosis not present

## 2022-11-20 MED ORDER — SODIUM CHLORIDE 0.9% FLUSH
10.0000 mL | INTRAVENOUS | Status: DC | PRN
  Administered 2022-11-20: 10 mL

## 2022-11-20 MED ORDER — SODIUM CHLORIDE 0.9 % IV SOLN: Freq: Once | INTRAVENOUS | Status: AC

## 2022-11-20 MED ORDER — HEPARIN SOD (PORK) LOCK FLUSH 100 UNIT/ML IV SOLN
500.0000 [IU] | Freq: Once | INTRAVENOUS | Status: AC | PRN
  Administered 2022-11-20: 500 [IU]

## 2022-11-20 MED ORDER — SODIUM CHLORIDE 0.9 % IV SOLN
10.0000 mg | Freq: Once | INTRAVENOUS | Status: AC
  Administered 2022-11-20: 10 mg via INTRAVENOUS
  Filled 2022-11-20: qty 10

## 2022-11-20 MED ORDER — TRILACICLIB DIHYDROCHLORIDE INJECTION 300 MG
240.0000 mg/m2 | Freq: Once | INTRAVENOUS | Status: AC
  Administered 2022-11-20: 600 mg via INTRAVENOUS
  Filled 2022-11-20: qty 40

## 2022-11-20 MED ORDER — SODIUM CHLORIDE 0.9 % IV SOLN
80.0000 mg/m2 | Freq: Once | INTRAVENOUS | Status: AC
  Administered 2022-11-20: 210 mg via INTRAVENOUS
  Filled 2022-11-20: qty 10.5

## 2022-11-20 NOTE — Patient Instructions (Signed)
[facility]  Discharge Instructions: Thank you for choosing [facility] to provide your oncology and hematology care.   If you have a lab appointment with the Cancer Center, please go directly to the Cancer Center and check in at the registration area.   Wear comfortable clothing and clothing appropriate for easy access to any Portacath or PICC line.   We strive to give you quality time with your provider. You may need to reschedule your appointment if you arrive late (15 or more minutes).  Arriving late affects you and other patients whose appointments are after yours.  Also, if you miss three or more appointments without notifying the office, you may be dismissed from the clinic at the provider's discretion.      For prescription refill requests, have your pharmacy contact our office and allow 72 hours for refills to be completed.    Today you received the following chemotherapy and/or immunotherapy agents; Cosela & Etoposide      To help prevent nausea and vomiting after your treatment, we encourage you to take your nausea medication as directed.  BELOW ARE SYMPTOMS THAT SHOULD BE REPORTED IMMEDIATELY: *FEVER GREATER THAN 100.4 F (38 C) OR HIGHER *CHILLS OR SWEATING *NAUSEA AND VOMITING THAT IS NOT CONTROLLED WITH YOUR NAUSEA MEDICATION *UNUSUAL SHORTNESS OF BREATH *UNUSUAL BRUISING OR BLEEDING *URINARY PROBLEMS (pain or burning when urinating, or frequent urination) *BOWEL PROBLEMS (unusual diarrhea, constipation, pain near the anus) TENDERNESS IN MOUTH AND THROAT WITH OR WITHOUT PRESENCE OF ULCERS (sore throat, sores in mouth, or a toothache) UNUSUAL RASH, SWELLING OR PAIN  UNUSUAL VAGINAL DISCHARGE OR ITCHING   Items with * indicate a potential emergency and should be followed up as soon as possible or go to the Emergency Department if any problems should occur.  Please show the CHEMOTHERAPY ALERT CARD or IMMUNOTHERAPY ALERT CARD at  check-in to the Emergency Department and triage nurse.  Should you have questions after your visit or need to cancel or reschedule your appointment, please contact [facility]  Dept: [phone]  and follow the prompts.  Office hours are 8:00 a.m. to 4:30 p.m. Monday - Friday. Please note that voicemails left after 4:00 p.m. may not be returned until the following business day.  We are closed weekends and major holidays. You have access to a nurse at all times for urgent questions. Please call the main number to the clinic Dept: [phone] and follow the prompts.   For any non-urgent questions, you may also contact your provider using MyChart. We now offer e-Visits for anyone 18 and older to request care online for non-urgent symptoms. For details visit [email or web address].   Also download the MyChart app! Go to the app store, search "MyChart", open the app, select , and log in with your MyChart username and password.  Masks are optional in the cancer centers. If you would like for your care team to wear a mask while they are taking care of you, please let them know. You may have one support person who is at least 72 years old accompany you for your appointments. 

## 2022-11-24 ENCOUNTER — Telehealth: Payer: Self-pay | Admitting: Medical Oncology

## 2022-11-24 DIAGNOSIS — J90 Pleural effusion, not elsewhere classified: Secondary | ICD-10-CM | POA: Diagnosis not present

## 2022-11-24 DIAGNOSIS — K7689 Other specified diseases of liver: Secondary | ICD-10-CM | POA: Diagnosis not present

## 2022-11-24 DIAGNOSIS — Z923 Personal history of irradiation: Secondary | ICD-10-CM | POA: Diagnosis not present

## 2022-11-24 DIAGNOSIS — C3411 Malignant neoplasm of upper lobe, right bronchus or lung: Secondary | ICD-10-CM | POA: Diagnosis not present

## 2022-11-24 DIAGNOSIS — G930 Cerebral cysts: Secondary | ICD-10-CM | POA: Diagnosis not present

## 2022-11-24 DIAGNOSIS — G319 Degenerative disease of nervous system, unspecified: Secondary | ICD-10-CM | POA: Diagnosis not present

## 2022-11-24 DIAGNOSIS — H8112 Benign paroxysmal vertigo, left ear: Secondary | ICD-10-CM | POA: Diagnosis not present

## 2022-11-24 DIAGNOSIS — R2681 Unsteadiness on feet: Secondary | ICD-10-CM | POA: Diagnosis not present

## 2022-11-24 DIAGNOSIS — H903 Sensorineural hearing loss, bilateral: Secondary | ICD-10-CM | POA: Diagnosis not present

## 2022-11-24 DIAGNOSIS — C3492 Malignant neoplasm of unspecified part of left bronchus or lung: Secondary | ICD-10-CM | POA: Diagnosis not present

## 2022-11-24 NOTE — Telephone Encounter (Signed)
LVM to return my call re appt.

## 2022-11-24 NOTE — Progress Notes (Signed)
Radiation Oncology         [phone] ________________________________  Name: Adam Wise        MRN: [id number]  Date of Service: 11/25/2022 DOB: May 09, 1951  FY:BOFBP, Adam Penna, NP  Curt Bears, MD     REFERRING PHYSICIAN: Curt Bears, MD   DIAGNOSIS: The encounter diagnosis was Small cell lung cancer, right upper lobe ([place]).   HISTORY OF PRESENT ILLNESS: Adam Wise is a 71 y.o. male seen at the request of Dr. Julien Nordmann who was diagnosed with extensive small cell lung cancer arising in the right upper lobe in April 2021.  He received a palliative course of radiation involving the right upper lobe overlapping into the right middle lobe. He continued with carboplatin and etoposide with his last dose being in November 2021 and he received immunotherapy along with this and maintenance.  The patient was found to have disease progression and was restarted on carboplatin etoposide and Tecentriq beginning in January 2022.  He received 8 cycles of this.  He also received 12 cycles of Zepzelca, and palliative irinotecan which was discontinued after his April 01, 2022 dose due to disease progression.  He was counseled on the risks of continuation of platinum based treatment but felt personally that he would accept all risks as he had previously done well with this therapy and began on 05/28/2022 he has received dose reductions since cycle 5 but completed 9 cycles of this as of 11/20/2022. Interval imaging on 11/14/2022 showed continued enlargement of a hypodense lesion in the anterior right lobe of the liver measuring up to 5.1 cm, previously it had been 3.3 cm in greatest dimension in comparison in October 2023.  Additional disease in his liver was felt to be stable along the dome, segment 7, and the caudate.  He also has stable changes consistent with prior treatment in the chest and stable unchanged nodule in the right adrenal gland.  With these findings and concerns for how heavily he has been  pretreated, he is also considering options of clinical trial enrollment and has another visit in January at [facility].  He is seen today to discuss the possibility of stereotactic body radiotherapy for his primary liver lesion.  PREVIOUS RADIATION THERAPY:   04/12/20-05/02/20:  The right lung target overlapping the RUL and RML was treated to 37.5 Gy in 15 fractions   PAST MEDICAL HISTORY:  Past Medical History:  Diagnosis Date   Chickenpox    Chronic knee pain    Chronic low back pain    COPD exacerbation ([place]) 10/11/2021   COPD with exacerbation ([place]) 10/12/2021   Coronary artery calcification seen on CAT scan 11/2021   Coronary CTA 11/27/2021: Moderate to large right pleural effusion and compressive atelectasis right lung base. => Coronary Calcium Score 657.  Diffuse RCA calcification.  Minimal mild disease in the LAD and diagonal branches. == Overall limited study.  Notable artifact.   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 He has disease progression after the first line of chemotherapy in December 2021.   Testosterone deficiency    Type 2 diabetes mellitus ([place])  PAST SURGICAL HISTORY: Past Surgical History:  Procedure Laterality Date   CHEST TUBE INSERTION Right 01/20/2022   Procedure: CHEST TUBE INSERTION;  Surgeon: Candee Furbish, MD;  Location: [facility] ENDOSCOPY;  Service: Pulmonary;  Laterality: Right;  w/ Talc Pleurodesis, planned admit for Obs afterwards   CHEST TUBE INSERTION Right 04/28/2022   Procedure: INSERTION PLEURAL DRAINAGE CATHETER - Pigtail tail, drainage;  Surgeon:  Candee Furbish, MD;  Location: [facility] ENDOSCOPY;  Service: Pulmonary;  Laterality: Right;  talc pleurodesis   CHEST TUBE INSERTION Right 09/01/2022   Procedure: INSERTION PLEURAL DRAINAGE CATHETER;  Surgeon: Juanito Doom, MD;  Location: [place];  Service: Cardiopulmonary;  Laterality: Right;  pigtail catheter placement with talc pleurodesis   COLONOSCOPY WITH PROPOFOL N/A 12/17/2018   Procedure: COLONOSCOPY WITH PROPOFOL;  Surgeon: Virgel Manifold, MD;  Location: [facility] ENDOSCOPY;  Service: Gastroenterology;  Laterality: N/A;   IR IMAGING GUIDED PORT INSERTION  04/17/2020   IR THORACENTESIS ASP PLEURAL SPACE W/IMG GUIDE  11/29/2021   IR THORACENTESIS ASP PLEURAL SPACE W/IMG GUIDE  01/03/2022   JOINT REPLACEMENT Bilateral    REPLACEMENT TOTAL KNEE BILATERAL  2015   TALC PLEURODESIS  09/01/2022   Procedure: Pietro Cassis;  Surgeon: Juanito Doom, MD;  Location: [facility] ENDOSCOPY;  Service: Cardiopulmonary;;   [place] ECHOCARDIOGRAM  05/2020   a) 05/2020: EF 55 to 60%.  No R WMA.  Mild LVH.  Indeterminate LVEDP.  Unable to assess RVP.  Normal aortic and mitral valves.  Mildly elevated RAP.; b) 09/2021: EF 50-55%. No RWMA. Mild LVH. ~ LVEDP. Mild LA Dilation. NORMAL RV/RAP.  Normal MV/AoV.     FAMILY HISTORY:  Family History  Problem Relation Age of Onset   Heart attack Mother    Cancer Mother    Hypertension Mother    Arthritis Father    Asthma Father    Cancer Father    COPD Father    Heart attack Father    Hypertension Sister    Cancer Sister    Diabetes Sister    Asthma Sister    Cancer Sister    COPD Sister    Arthritis Sister    Asthma Sister    Diabetes Sister    Birth defects Maternal Grandfather    Arthritis Paternal Grandmother    Diabetes Paternal Grandmother    Arthritis Paternal Grandfather    Asthma Son    Other Neg Hx        pituitary disorder     SOCIAL HISTORY:  reports that he has quit smoking. He has never used  smokeless tobacco. He reports that he does not currently use alcohol. He reports that he does not currently use drugs.  The patient is married and lives in [place].  He is accompanied today by his wife.    ALLERGIES: Bupropion and Hydrochlorothiazide   MEDICATIONS:  Current Outpatient Medications  Medication Sig Dispense Refill   albuterol (VENTOLIN HFA) 108 (90 Base) MCG/ACT inhaler Inhale 2 puffs into the lungs every 6 (six) hours as needed for wheezing or shortness of breath. 18 g 0   amLODipine (NORVASC) 10 MG tablet TAKE 1 TABLET BY MOUTH EVERY DAY FOR BLOOD PRESSURE 90 tablet 3   B-D 3CC LUER-LOK SYR 22GX1" 22G X 1" 3 ML MISC USE AS INSTRUCTED FOR TESTOSTERONE INJECTION EVERY 2 WEEKS 10 each 2   bisoprolol (ZEBETA) 5 MG tablet TAKE 1 TABLET (5 MG TOTAL) BY MOUTH DAILY. 90 tablet 2  Black Pepper-Turmeric (TURMERIC COMPLEX/BLACK PEPPER PO) Take 1 tablet by mouth in the morning and at bedtime.     blood glucose meter kit and supplies KIT Dispense based on patient and insurance preference. Use up to four times daily as directed. 1 each 0   Budeson-Glycopyrrol-Formoterol (BREZTRI AEROSPHERE) 160-9-4.8 MCG/ACT AERO Inhale 2 puffs into the lungs in the morning and at bedtime. 10.7 g 3   chlorpheniramine-HYDROcodone (TUSSIONEX) 10-8 MG/5ML Take 5 mLs by mouth every 12 (twelve) hours as needed for cough. 473 mL 0   Coenzyme Q10 (COQ10) 200 MG CAPS Take 200 mg by mouth at bedtime.     ELIQUIS 5 MG TABS tablet TAKE 1 TABLET BY MOUTH TWICE A DAY 180 tablet 2   esomeprazole (NEXIUM) 20 MG capsule Take 20 mg by mouth in the morning.     gabapentin (NEURONTIN) 300 MG capsule TAKE 1 CAPSULE (300 MG TOTAL) BY MOUTH 2 (TWO) TIMES DAILY. FOR BACK PAIN. 180 capsule 2   glipiZIDE (GLUCOTROL) 10 MG tablet TAKE 1 TABLET (10 MG TOTAL) BY MOUTH 2 (TWO) TIMES DAILY BEFORE A MEAL. FOR DIABETES. 180 tablet 0   glucose blood (ONETOUCH ULTRA) test strip USE UP TO 4 TIMES DAILY AS DIRECTED 400 strip 5    Insulin Glargine (BASAGLAR KWIKPEN) 100 UNIT/ML Inject 24 Units into the skin at bedtime. For diabetes. 30 mL 1   Insulin Pen Needle (BD PEN NEEDLE NANO U/F) 32G X 4 MM MISC Use with insulin as prescribed Dx Code: E11.9 200 each 3   ipratropium-albuterol (DUONEB) 0.5-2.5 (3) MG/3ML SOLN Take 3 mLs by nebulization every 4 (four) hours while awake for 3 days, THEN 3 mLs every 4 (four) hours as needed (shortness of breath or wheezing). 360 mL 0   KRILL OIL PO Take 1 capsule by mouth daily.     Lancets MISC USE UP TO 3 TIMES DAILY AS DIRECTED 100 each 2   losartan (COZAAR) 50 MG tablet TAKE 1 TABLET (50 MG TOTAL) BY MOUTH DAILY. FOR BLOOD PRESSURE. 90 tablet 2   meclizine (ANTIVERT) 12.5 MG tablet Take 1 tablet (12.5 mg total) by mouth 3 (three) times daily as needed for dizziness. (Patient not taking: Reported on 11/12/2022) 30 tablet 1   Melatonin 10 MG CAPS Take 10 mg by mouth at bedtime.     metFORMIN (GLUCOPHAGE) 1000 MG tablet TAKE 1 TABLET (1,000 MG TOTAL) BY MOUTH 2 (TWO) TIMES DAILY WITH A MEAL. FOR DIABETES 180 tablet 0   Multiple Vitamin (MULTI-VITAMINS) TABS Take 1 tablet by mouth in the morning.     oxymetazoline (AFRIN) 0.05 % nasal spray Place 1 spray into both nostrils daily as needed for congestion.     pioglitazone (ACTOS) 45 MG tablet TAKE 1 TABLET (45 MG TOTAL) BY MOUTH DAILY. FOR DIABETES. 90 tablet 0   pravastatin (PRAVACHOL) 40 MG tablet TAKE 1 TABLET BY MOUTH EVERY DAY IN THE EVENING FOR CHOLESTEROL 90 tablet 2   testosterone cypionate (DEPOTESTOSTERONE CYPIONATE) 200 MG/ML injection Inject 200 mg into the muscle every 14 (fourteen) days.     traMADol (ULTRAM) 50 MG tablet Take 1 tablet (50 mg total) by mouth every 6 (six) hours as needed. 15 tablet 0   No current facility-administered medications for this visit.     REVIEW OF SYSTEMS: On review of systems, the patient reports that that he is doing very well considering his circumstance.  He reports that he really has  tolerated all treatments that he has received to date  quite well and better than what he expected.  He states that he does not like using steroids prior to his infusions out of concern of his blood sugars.  He also had Epley maneuver performed yesterday for inner ear dysfunction and still has some ongoing vertigo from this.  Otherwise no complaints are verbalized.    PHYSICAL EXAM:  Wt Readings from Last 3 Encounters:  11/18/22 (!) 303 lb 3.2 oz (137.5 kg)  11/12/22 300 lb (136.1 kg)  10/30/22 288 lb 4 oz (130.7 kg)   In general this is a well appearing caucasian male in no acute distress. He's alert and oriented x4 and appropriate throughout the examination. Cardiopulmonary assessment is negative for acute distress and he exhibits normal effort.    ECOG = 1  0 - Asymptomatic (Fully active, able to carry on all predisease activities without restriction)  1 - Symptomatic but completely ambulatory (Restricted in physically strenuous activity but ambulatory and able to carry out work of a light or sedentary nature. For example, light housework, office work)  2 - Symptomatic, <50% in bed during the day (Ambulatory and capable of all self care but unable to carry out any work activities. Up and about more than 50% of waking hours)  3 - Symptomatic, >50% in bed, but not bedbound (Capable of only limited self-care, confined to bed or chair 50% or more of waking hours)  4 - Bedbound (Completely disabled. Cannot carry on any self-care. Totally confined to bed or chair)  5 - Death   Eustace Pen MM, Creech RH, Tormey DC, et al. [phone]). "Toxicity and response criteria of the [facility]". [place] Oncol. 5 (6): 649-55    LABORATORY DATA:  Lab Results  Component Value Date   WBC 2.9 (L) 11/18/2022   HGB 8.3 (L) 11/18/2022   HCT 25.9 (L) 11/18/2022   MCV 98.1 11/18/2022   PLT 173 11/18/2022   Lab Results  Component Value Date   NA 139 11/18/2022   K 4.0 11/18/2022    CL 103 11/18/2022   CO2 28 11/18/2022   Lab Results  Component Value Date   ALT 12 11/18/2022   AST 15 11/18/2022   ALKPHOS 74 11/18/2022   BILITOT 0.3 11/18/2022      RADIOGRAPHY: CT Chest W Contrast  Result Date: 11/15/2022 CLINICAL DATA:  Metastatic small-cell lung cancer, liver metastases, assess treatment response * Tracking Code: BO * EXAM: CT CHEST, ABDOMEN, AND PELVIS WITH CONTRAST TECHNIQUE: Multidetector CT imaging of the chest, abdomen and pelvis was performed following the standard protocol during bolus administration of intravenous contrast. RADIATION DOSE REDUCTION: This exam was performed according to the departmental dose-optimization program which includes automated exposure control, adjustment of the mA and/or kV according to patient size and/or use of iterative reconstruction technique. CONTRAST:  175m OMNIPAQUE IOHEXOL 300 MG/ML  SOLN COMPARISON:  09/29/2022 FINDINGS: CT CHEST FINDINGS Cardiovascular: Right chest port catheter. Aortic atherosclerosis. Normal heart size. Left and right coronary artery calcifications. No pericardial effusion. Mediastinum/Nodes: Unchanged post treatment soft tissue thickening about the right hilum. No discretely enlarged mediastinal, hilar, or axillary lymph nodes. Thyroid gland, trachea, and esophagus demonstrate no significant findings. Lungs/Pleura: Unchanged post treatment/post radiation appearance of the right lung with dense perihilar and suprahilar consolidation and fibrosis (series 6, image 43). Unchanged small right pleural effusion with some associated pleural thickening and most likely prior talc pleurodesis (series 2, image 51). Musculoskeletal: No chest wall abnormality. No acute osseous findings. CT ABDOMEN PELVIS FINDINGS  Hepatobiliary: Continued enlargement of a hypodense lesion of the anterior right lobe of the liver, hepatic segment V, measuring 5.1 x 3.9 cm, previously 3.3 x 2.9 cm (series 2, image 52). Additional hypodense liver  lesions are unchanged, including of the liver dome, hepatic segment VII (series 2, image 40) and the caudate (series 2, image 51). No gallstones, gallbladder wall thickening, or biliary dilatation. Pancreas: Unremarkable. No pancreatic ductal dilatation or surrounding inflammatory changes. Spleen: Normal in size without significant abnormality. Adrenals/Urinary Tract: Unchanged small nodule of the medial limb of the right adrenal gland measuring 1.5 x 1.1 cm, presumed benign adenoma (series 2, image 52). Left adrenal gland unremarkable. Simple, benign renal cortical cysts, larger and more numerous on the right, for which no further follow-up or characterization is required. Kidneys are otherwise normal, without renal calculi, solid lesion, or hydronephrosis. Bladder is unremarkable. Stomach/Bowel: Stomach is within normal limits. Appendix appears normal. No evidence of bowel wall thickening, distention, or inflammatory changes. Sigmoid diverticula. Vascular/Lymphatic: Aortic atherosclerosis. No enlarged abdominal or pelvic lymph nodes. Reproductive: Mild prostatomegaly. Other: No abdominal wall hernia or abnormality. No ascites. Musculoskeletal: No acute osseous findings. IMPRESSION: 1. Continued enlargement of a hypodense lesion of the anterior right lobe of the liver, hepatic segment V, consistent with worsened hepatic metastatic disease. Additional hypodense liver lesions are unchanged. 2. Unchanged post treatment/post radiation appearance of the right lung with dense perihilar and suprahilar consolidation and fibrosis. 3. Unchanged small right pleural effusion with some associated pleural thickening and evidence of prior talc pleurodesis. 4. No evidence of new metastatic disease in the chest, abdomen, or pelvis. 5. Coronary artery disease. 6. Mild prostatomegaly. Aortic Atherosclerosis (ICD10-I70.0). Electronically Signed   By: Delanna Ahmadi M.D.   On: 11/15/2022 15:19   CT Abdomen Pelvis W Contrast  Result  Date: 11/15/2022 CLINICAL DATA:  Metastatic small-cell lung cancer, liver metastases, assess treatment response * Tracking Code: BO * EXAM: CT CHEST, ABDOMEN, AND PELVIS WITH CONTRAST TECHNIQUE: Multidetector CT imaging of the chest, abdomen and pelvis was performed following the standard protocol during bolus administration of intravenous contrast. RADIATION DOSE REDUCTION: This exam was performed according to the departmental dose-optimization program which includes automated exposure control, adjustment of the mA and/or kV according to patient size and/or use of iterative reconstruction technique. CONTRAST:  187m OMNIPAQUE IOHEXOL 300 MG/ML  SOLN COMPARISON:  09/29/2022 FINDINGS: CT CHEST FINDINGS Cardiovascular: Right chest port catheter. Aortic atherosclerosis. Normal heart size. Left and right coronary artery calcifications. No pericardial effusion. Mediastinum/Nodes: Unchanged post treatment soft tissue thickening about the right hilum. No discretely enlarged mediastinal, hilar, or axillary lymph nodes. Thyroid gland, trachea, and esophagus demonstrate no significant findings. Lungs/Pleura: Unchanged post treatment/post radiation appearance of the right lung with dense perihilar and suprahilar consolidation and fibrosis (series 6, image 43). Unchanged small right pleural effusion with some associated pleural thickening and most likely prior talc pleurodesis (series 2, image 51). Musculoskeletal: No chest wall abnormality. No acute osseous findings. CT ABDOMEN PELVIS FINDINGS Hepatobiliary: Continued enlargement of a hypodense lesion of the anterior right lobe of the liver, hepatic segment V, measuring 5.1 x 3.9 cm, previously 3.3 x 2.9 cm (series 2, image 52). Additional hypodense liver lesions are unchanged, including of the liver dome, hepatic segment VII (series 2, image 40) and the caudate (series 2, image 51). No gallstones, gallbladder wall thickening, or biliary dilatation. Pancreas: Unremarkable. No  pancreatic ductal dilatation or surrounding inflammatory changes. Spleen: Normal in size without significant abnormality. Adrenals/Urinary Tract: Unchanged small nodule of  the medial limb of the right adrenal gland measuring 1.5 x 1.1 cm, presumed benign adenoma (series 2, image 52). Left adrenal gland unremarkable. Simple, benign renal cortical cysts, larger and more numerous on the right, for which no further follow-up or characterization is required. Kidneys are otherwise normal, without renal calculi, solid lesion, or hydronephrosis. Bladder is unremarkable. Stomach/Bowel: Stomach is within normal limits. Appendix appears normal. No evidence of bowel wall thickening, distention, or inflammatory changes. Sigmoid diverticula. Vascular/Lymphatic: Aortic atherosclerosis. No enlarged abdominal or pelvic lymph nodes. Reproductive: Mild prostatomegaly. Other: No abdominal wall hernia or abnormality. No ascites. Musculoskeletal: No acute osseous findings. IMPRESSION: 1. Continued enlargement of a hypodense lesion of the anterior right lobe of the liver, hepatic segment V, consistent with worsened hepatic metastatic disease. Additional hypodense liver lesions are unchanged. 2. Unchanged post treatment/post radiation appearance of the right lung with dense perihilar and suprahilar consolidation and fibrosis. 3. Unchanged small right pleural effusion with some associated pleural thickening and evidence of prior talc pleurodesis. 4. No evidence of new metastatic disease in the chest, abdomen, or pelvis. 5. Coronary artery disease. 6. Mild prostatomegaly. Aortic Atherosclerosis (ICD10-I70.0). Electronically Signed   By: Delanna Ahmadi M.D.   On: 11/15/2022 15:19        IMPRESSION/PLAN: 1. Extensive stage small cell carcinoma arising in the right chest overlapping the right upper lobe and right middle lobe and hilum with liver disease. Dr. Lisbeth Renshaw discusses patient's course to date.  He has done remarkably well in the midst  of all of the treatments that he has received and has tolerated these quite well.  He does have progressive disease in his liver.  Given that its only 1 area in his liver of concern with progressive change, Dr. Lisbeth Renshaw is in favor of treating the larger area measuring up to 5.1 cm at this point.  He is not a good candidate for ablative therapy with interventional radiology due to the size of his tumor, but could consider stereotactic body radiotherapy.  Dr. Lisbeth Renshaw does recommend more definitive diagnostic workup to include an MRI of the liver with contrast, and discusses the need for fiducial marker placement.  He is scheduled to undergo his MRI next Tuesday and I will contact radiology to discuss his case in hopes of improving the request for fiducial marker placement.  Dr. Lisbeth Renshaw favors proceeding with simulation following both MRI and fiducial marker placement, and given his ongoing carboplatin and etoposide infusions, favors treatment the week of December 22, 2022.  We discussed the risks, benefits, short, and long term effects of radiotherapy, as well as the curative intent, and the patient is interested in proceeding. Dr. Lisbeth Renshaw discusses the delivery and logistics of radiotherapy and anticipates a course of 5 fractions  of radiotherapy with IV contrast prior to simulation.  We will coordinate his simulation once we have a better understanding of fiducial marker placement. Written consent is obtained and placed in the chart, a copy was provided to the patient.   2. Risks of brain disease from small cell lung cancer.  We have spoken with the patient on this topic previously and he has desired to forego prophylactic cranial irradiation out of concerns for cognitive deficits that could be caused by this treatment.  He has had surveillance with his medical oncologist rather than through the brain program which he prefers.  We did discuss that given the progressive changes that are being seen in the possibility of  discontinuing his carboplatin and topside in the future,  that more frequent MRIs could be considered and certainly if he developed any neurologic symptoms.  He is in agreement with this plan but again confirms that he is not interested in prophylactic cranial irradiation.  We confirmed his reassuring results from his MRI of the brain in October of this year.  We will be happy to review concerns or questions regarding this in the future and he is also aware that NCCN guidelines do now consider optional stereotactic radiosurgery on a case by case basis if he developed focal brain disease in the future as well.  In a visit lasting 60 minutes, greater than 50% of the time was spent face to face discussing the patient's condition, in preparation for the discussion, and coordinating the patient's care.   The above documentation reflects my direct findings during this shared patient visit. Please see the separate note by Dr. Lisbeth Renshaw on this date for the remainder of the patient's plan of care.    Carola Rhine, PAC

## 2022-11-24 NOTE — Telephone Encounter (Signed)
Pt has apt with Dr Mariel Kansky on Jan 8th,2024.

## 2022-11-25 ENCOUNTER — Other Ambulatory Visit: Payer: Self-pay | Admitting: Physician Assistant

## 2022-11-25 ENCOUNTER — Encounter: Payer: Self-pay | Admitting: Radiation Oncology

## 2022-11-25 ENCOUNTER — Other Ambulatory Visit: Payer: Self-pay | Admitting: Internal Medicine

## 2022-11-25 ENCOUNTER — Ambulatory Visit
Admission: RE | Admit: 2022-11-25 | Discharge: 2022-11-25 | Disposition: A | Discharge status: Home / Self Care | Payer: Medicare Other | Source: Ambulatory Visit | Attending: Radiation Oncology | Admitting: Radiation Oncology

## 2022-11-25 ENCOUNTER — Inpatient Hospital Stay: Payer: Medicare Other

## 2022-11-25 ENCOUNTER — Other Ambulatory Visit: Payer: Medicare Other

## 2022-11-25 ENCOUNTER — Encounter: Payer: Self-pay | Admitting: Physician Assistant

## 2022-11-25 VITALS — BP 115/71 | HR 81 | Temp 97.8°F | Resp 20 | Ht 68.0 in | Wt 300.0 lb

## 2022-11-25 DIAGNOSIS — Z79899 Other long term (current) drug therapy: Secondary | ICD-10-CM | POA: Insufficient documentation

## 2022-11-25 DIAGNOSIS — I700 Atherosclerosis of aorta: Secondary | ICD-10-CM | POA: Insufficient documentation

## 2022-11-25 DIAGNOSIS — I10 Essential (primary) hypertension: Secondary | ICD-10-CM | POA: Insufficient documentation

## 2022-11-25 DIAGNOSIS — Z7901 Long term (current) use of anticoagulants: Secondary | ICD-10-CM | POA: Insufficient documentation

## 2022-11-25 DIAGNOSIS — Z87891 Personal history of nicotine dependence: Secondary | ICD-10-CM | POA: Insufficient documentation

## 2022-11-25 DIAGNOSIS — C3481 Malignant neoplasm of overlapping sites of right bronchus and lung: Secondary | ICD-10-CM | POA: Insufficient documentation

## 2022-11-25 DIAGNOSIS — C787 Secondary malignant neoplasm of liver and intrahepatic bile duct: Secondary | ICD-10-CM | POA: Insufficient documentation

## 2022-11-25 DIAGNOSIS — I2510 Atherosclerotic heart disease of native coronary artery without angina pectoris: Secondary | ICD-10-CM | POA: Insufficient documentation

## 2022-11-25 DIAGNOSIS — E785 Hyperlipidemia, unspecified: Secondary | ICD-10-CM | POA: Insufficient documentation

## 2022-11-25 DIAGNOSIS — J90 Pleural effusion, not elsewhere classified: Secondary | ICD-10-CM | POA: Insufficient documentation

## 2022-11-25 DIAGNOSIS — G4733 Obstructive sleep apnea (adult) (pediatric): Secondary | ICD-10-CM | POA: Insufficient documentation

## 2022-11-25 DIAGNOSIS — C229 Malignant neoplasm of liver, not specified as primary or secondary: Secondary | ICD-10-CM

## 2022-11-25 DIAGNOSIS — C3411 Malignant neoplasm of upper lobe, right bronchus or lung: Secondary | ICD-10-CM | POA: Diagnosis not present

## 2022-11-25 DIAGNOSIS — K219 Gastro-esophageal reflux disease without esophagitis: Secondary | ICD-10-CM | POA: Insufficient documentation

## 2022-11-25 DIAGNOSIS — Z809 Family history of malignant neoplasm, unspecified: Secondary | ICD-10-CM | POA: Insufficient documentation

## 2022-11-25 DIAGNOSIS — J449 Chronic obstructive pulmonary disease, unspecified: Secondary | ICD-10-CM | POA: Insufficient documentation

## 2022-11-25 DIAGNOSIS — E119 Type 2 diabetes mellitus without complications: Secondary | ICD-10-CM | POA: Insufficient documentation

## 2022-11-25 DIAGNOSIS — N400 Enlarged prostate without lower urinary tract symptoms: Secondary | ICD-10-CM | POA: Insufficient documentation

## 2022-11-25 DIAGNOSIS — Z5111 Encounter for antineoplastic chemotherapy: Secondary | ICD-10-CM | POA: Diagnosis not present

## 2022-11-25 DIAGNOSIS — D649 Anemia, unspecified: Secondary | ICD-10-CM

## 2022-11-25 DIAGNOSIS — Z794 Long term (current) use of insulin: Secondary | ICD-10-CM | POA: Insufficient documentation

## 2022-11-25 DIAGNOSIS — K5730 Diverticulosis of large intestine without perforation or abscess without bleeding: Secondary | ICD-10-CM | POA: Insufficient documentation

## 2022-11-25 DIAGNOSIS — Z7984 Long term (current) use of oral hypoglycemic drugs: Secondary | ICD-10-CM | POA: Insufficient documentation

## 2022-11-25 DIAGNOSIS — C779 Secondary and unspecified malignant neoplasm of lymph node, unspecified: Secondary | ICD-10-CM | POA: Diagnosis not present

## 2022-11-25 LAB — CMP (CANCER CENTER ONLY)
ALT: 15 U/L (ref 0–44)
AST: 14 U/L — ABNORMAL LOW (ref 15–41)
Albumin: 3.8 g/dL (ref 3.5–5.0)
Alkaline Phosphatase: 60 U/L (ref 38–126)
Anion gap: 8 (ref 5–15)
BUN: 22 mg/dL (ref 8–23)
CO2: 28 mmol/L (ref 22–32)
Calcium: 8.9 mg/dL (ref 8.9–10.3)
Chloride: 104 mmol/L (ref 98–111)
Creatinine: 0.98 mg/dL (ref 0.61–1.24)
GFR, Estimated: >60 mL/min (ref >60)
Glucose, Bld: 193 mg/dL — ABNORMAL HIGH (ref 70–99)
Potassium: 4.3 mmol/L (ref 3.5–5.1)
Sodium: 140 mmol/L (ref 135–145)
Total Bilirubin: 0.4 mg/dL (ref 0.3–1.2)
Total Protein: 6 g/dL — ABNORMAL LOW (ref 6.5–8.1)

## 2022-11-25 LAB — CBC WITH DIFFERENTIAL (CANCER CENTER ONLY)
Abs Immature Granulocytes: 0.08 10*3/uL — ABNORMAL HIGH (ref 0.00–0.07)
Basophils Absolute: 0 10*3/uL (ref 0.0–0.1)
Basophils Relative: 1 %
Eosinophils Absolute: 0.1 10*3/uL (ref 0.0–0.5)
Eosinophils Relative: 2 %
HCT: 25.6 % — ABNORMAL LOW (ref 39.0–52.0)
Hemoglobin: 8.3 g/dL — ABNORMAL LOW (ref 13.0–17.0)
Immature Granulocytes: 2 %
Lymphocytes Relative: 10 %
Lymphs Abs: 0.3 10*3/uL — ABNORMAL LOW (ref 0.7–4.0)
MCH: 31.3 pg (ref 26.0–34.0)
MCHC: 32.4 g/dL (ref 30.0–36.0)
MCV: 96.6 fL (ref 80.0–100.0)
Monocytes Absolute: 0.1 10*3/uL (ref 0.1–1.0)
Monocytes Relative: 4 %
Neutro Abs: 2.8 10*3/uL (ref 1.7–7.7)
Neutrophils Relative %: 81 %
Platelet Count: 132 10*3/uL — ABNORMAL LOW (ref 150–400)
RBC: 2.65 MIL/uL — ABNORMAL LOW (ref 4.22–5.81)
RDW: 16.8 % — ABNORMAL HIGH (ref 11.5–15.5)
WBC Count: 3.4 10*3/uL — ABNORMAL LOW (ref 4.0–10.5)
nRBC: 0 % (ref 0.0–0.2)

## 2022-11-25 LAB — SAMPLE TO BLOOD BANK

## 2022-11-25 NOTE — Progress Notes (Signed)
Nursing interview reconsult for a 71 yr old male w/ Malignant neoplasm of liver. I verified patient's identity and began nursing interview.  Patient reports unrelated vertigo, but is otherwise doing well. No other issues reported at this time.  Meaningful use complete.  BP 115/71 (BP Location: Left Arm, Patient Position: Sitting, Cuff Size: Large)   Pulse 81   Temp 97.8 F (36.6 C) (Temporal)   Resp 20   Ht 5\' 8"  (1.727 m)   Wt 300 lb (136.1 kg)   SpO2 99%   BMI 45.61 kg/m   This concludes the interview.   Leandra Kern, LPN

## 2022-11-25 NOTE — Progress Notes (Signed)
Suttle, Rosanne Ashing, MD  Donita Brooks D Approved for US guided fiducial marker placement about right lobe liver mass.  Attention to forthcoming MRI abdomen which may be done prior to procedure.  Dylan

## 2022-11-25 NOTE — Addendum Note (Signed)
Encounter addended by: Mollie Germany, LPN on: 07/62/2633 3:54 PM  Actions taken: Visit diagnoses modified

## 2022-11-28 ENCOUNTER — Other Ambulatory Visit: Payer: Self-pay | Admitting: Primary Care

## 2022-11-28 DIAGNOSIS — E119 Type 2 diabetes mellitus without complications: Secondary | ICD-10-CM

## 2022-12-01 ENCOUNTER — Other Ambulatory Visit: Payer: Self-pay | Admitting: Primary Care

## 2022-12-01 DIAGNOSIS — I10 Essential (primary) hypertension: Secondary | ICD-10-CM

## 2022-12-02 ENCOUNTER — Other Ambulatory Visit: Payer: Medicare Other

## 2022-12-02 ENCOUNTER — Ambulatory Visit (HOSPITAL_COMMUNITY)
Admission: RE | Admit: 2022-12-02 | Discharge: 2022-12-02 | Disposition: A | Discharge status: Home / Self Care | Payer: Medicare Other | Source: Ambulatory Visit | Attending: Radiation Oncology | Admitting: Radiation Oncology

## 2022-12-02 ENCOUNTER — Inpatient Hospital Stay: Payer: Medicare Other

## 2022-12-02 ENCOUNTER — Telehealth: Payer: Self-pay | Admitting: Medical Oncology

## 2022-12-02 ENCOUNTER — Other Ambulatory Visit: Payer: Self-pay | Admitting: Medical Oncology

## 2022-12-02 DIAGNOSIS — D649 Anemia, unspecified: Secondary | ICD-10-CM

## 2022-12-02 DIAGNOSIS — C229 Malignant neoplasm of liver, not specified as primary or secondary: Secondary | ICD-10-CM | POA: Diagnosis not present

## 2022-12-02 DIAGNOSIS — Z7984 Long term (current) use of oral hypoglycemic drugs: Secondary | ICD-10-CM | POA: Diagnosis not present

## 2022-12-02 DIAGNOSIS — Z5111 Encounter for antineoplastic chemotherapy: Secondary | ICD-10-CM | POA: Diagnosis not present

## 2022-12-02 DIAGNOSIS — Z85118 Personal history of other malignant neoplasm of bronchus and lung: Secondary | ICD-10-CM | POA: Diagnosis not present

## 2022-12-02 DIAGNOSIS — D3501 Benign neoplasm of right adrenal gland: Secondary | ICD-10-CM | POA: Diagnosis not present

## 2022-12-02 DIAGNOSIS — K769 Liver disease, unspecified: Secondary | ICD-10-CM | POA: Diagnosis not present

## 2022-12-02 DIAGNOSIS — N289 Disorder of kidney and ureter, unspecified: Secondary | ICD-10-CM | POA: Diagnosis not present

## 2022-12-02 DIAGNOSIS — C3411 Malignant neoplasm of upper lobe, right bronchus or lung: Secondary | ICD-10-CM

## 2022-12-02 DIAGNOSIS — C787 Secondary malignant neoplasm of liver and intrahepatic bile duct: Secondary | ICD-10-CM | POA: Diagnosis not present

## 2022-12-02 DIAGNOSIS — Z95828 Presence of other vascular implants and grafts: Secondary | ICD-10-CM

## 2022-12-02 DIAGNOSIS — Z79899 Other long term (current) drug therapy: Secondary | ICD-10-CM | POA: Diagnosis not present

## 2022-12-02 DIAGNOSIS — J90 Pleural effusion, not elsewhere classified: Secondary | ICD-10-CM | POA: Diagnosis not present

## 2022-12-02 LAB — CBC WITH DIFFERENTIAL (CANCER CENTER ONLY)
Abs Immature Granulocytes: 0.01 10*3/uL (ref 0.00–0.07)
Basophils Absolute: 0 10*3/uL (ref 0.0–0.1)
Basophils Relative: 1 %
Eosinophils Absolute: 0 10*3/uL (ref 0.0–0.5)
Eosinophils Relative: 0 %
HCT: 24.2 % — ABNORMAL LOW (ref 39.0–52.0)
Hemoglobin: 7.8 g/dL — ABNORMAL LOW (ref 13.0–17.0)
Immature Granulocytes: 0 %
Lymphocytes Relative: 20 %
Lymphs Abs: 0.5 10*3/uL — ABNORMAL LOW (ref 0.7–4.0)
MCH: 31.3 pg (ref 26.0–34.0)
MCHC: 32.2 g/dL (ref 30.0–36.0)
MCV: 97.2 fL (ref 80.0–100.0)
Monocytes Absolute: 0.5 10*3/uL (ref 0.1–1.0)
Monocytes Relative: 18 %
Neutro Abs: 1.5 10*3/uL — ABNORMAL LOW (ref 1.7–7.7)
Neutrophils Relative %: 61 %
Platelet Count: 83 10*3/uL — ABNORMAL LOW (ref 150–400)
RBC: 2.49 MIL/uL — ABNORMAL LOW (ref 4.22–5.81)
RDW: 17.7 % — ABNORMAL HIGH (ref 11.5–15.5)
WBC Count: 2.5 10*3/uL — ABNORMAL LOW (ref 4.0–10.5)
nRBC: 0 % (ref 0.0–0.2)

## 2022-12-02 LAB — CMP (CANCER CENTER ONLY)
ALT: 19 U/L (ref 0–44)
AST: 17 U/L (ref 15–41)
Albumin: 3.7 g/dL (ref 3.5–5.0)
Alkaline Phosphatase: 54 U/L (ref 38–126)
Anion gap: 10 (ref 5–15)
BUN: 20 mg/dL (ref 8–23)
CO2: 27 mmol/L (ref 22–32)
Calcium: 8.2 mg/dL — ABNORMAL LOW (ref 8.9–10.3)
Chloride: 103 mmol/L (ref 98–111)
Creatinine: 1.09 mg/dL (ref 0.61–1.24)
GFR, Estimated: >60 mL/min (ref >60)
Glucose, Bld: 118 mg/dL — ABNORMAL HIGH (ref 70–99)
Potassium: 4 mmol/L (ref 3.5–5.1)
Sodium: 140 mmol/L (ref 135–145)
Total Bilirubin: 0.5 mg/dL (ref 0.3–1.2)
Total Protein: 6.8 g/dL (ref 6.5–8.1)

## 2022-12-02 LAB — SAMPLE TO BLOOD BANK

## 2022-12-02 MED ORDER — SODIUM CHLORIDE 0.9% FLUSH
10.0000 mL | Freq: Once | INTRAVENOUS | Status: AC
  Administered 2022-12-02: 10 mL

## 2022-12-02 MED ORDER — GADOBUTROL 1 MMOL/ML IV SOLN
10.0000 mL | Freq: Once | INTRAVENOUS | Status: AC | PRN
  Administered 2022-12-02: 10 mL via INTRAVENOUS

## 2022-12-02 MED ORDER — HEPARIN SOD (PORK) LOCK FLUSH 100 UNIT/ML IV SOLN
500.0000 [IU] | Freq: Once | INTRAVENOUS | Status: AC
  Administered 2022-12-02: 500 [IU]

## 2022-12-02 NOTE — Telephone Encounter (Signed)
Hgb 7.8 and platelets 83 K. Pt notified and schedule message sent.  Pt notified of bleeding precautions . He said he had a mild nosebleed.   His appt with Dr Mariel Kansky is jan 8th .

## 2022-12-04 ENCOUNTER — Other Ambulatory Visit: Payer: Self-pay | Admitting: Primary Care

## 2022-12-04 ENCOUNTER — Telehealth: Payer: Self-pay | Admitting: Radiation Oncology

## 2022-12-04 DIAGNOSIS — H8112 Benign paroxysmal vertigo, left ear: Secondary | ICD-10-CM | POA: Diagnosis not present

## 2022-12-04 DIAGNOSIS — E1169 Type 2 diabetes mellitus with other specified complication: Secondary | ICD-10-CM

## 2022-12-04 DIAGNOSIS — E119 Type 2 diabetes mellitus without complications: Secondary | ICD-10-CM

## 2022-12-04 NOTE — Telephone Encounter (Signed)
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 He is in agreement and has fiducial placement 12/17/22, and sees Dr. Dema Severin at [facility] to consider clinical trial on 12/22/22. We plan to start therapy 12/30/22.

## 2022-12-05 ENCOUNTER — Inpatient Hospital Stay: Payer: Medicare Other

## 2022-12-05 DIAGNOSIS — Z5111 Encounter for antineoplastic chemotherapy: Secondary | ICD-10-CM | POA: Diagnosis not present

## 2022-12-05 DIAGNOSIS — Z79899 Other long term (current) drug therapy: Secondary | ICD-10-CM | POA: Diagnosis not present

## 2022-12-05 DIAGNOSIS — Z7984 Long term (current) use of oral hypoglycemic drugs: Secondary | ICD-10-CM | POA: Diagnosis not present

## 2022-12-05 DIAGNOSIS — C787 Secondary malignant neoplasm of liver and intrahepatic bile duct: Secondary | ICD-10-CM | POA: Diagnosis not present

## 2022-12-05 DIAGNOSIS — J90 Pleural effusion, not elsewhere classified: Secondary | ICD-10-CM | POA: Diagnosis not present

## 2022-12-05 DIAGNOSIS — D649 Anemia, unspecified: Secondary | ICD-10-CM

## 2022-12-05 DIAGNOSIS — C3411 Malignant neoplasm of upper lobe, right bronchus or lung: Secondary | ICD-10-CM | POA: Diagnosis not present

## 2022-12-05 MED ORDER — SODIUM CHLORIDE 0.9% IV SOLUTION
250.0000 mL | Freq: Once | INTRAVENOUS | Status: AC
  Administered 2022-12-05: 250 mL via INTRAVENOUS

## 2022-12-05 MED ORDER — SODIUM CHLORIDE 0.9% FLUSH
10.0000 mL | INTRAVENOUS | Status: AC | PRN
  Administered 2022-12-05: 10 mL

## 2022-12-05 MED ORDER — HEPARIN SOD (PORK) LOCK FLUSH 100 UNIT/ML IV SOLN
500.0000 [IU] | Freq: Every day | INTRAVENOUS | Status: AC | PRN
  Administered 2022-12-05: 500 [IU]

## 2022-12-05 MED ORDER — DIPHENHYDRAMINE HCL 25 MG PO CAPS: 25.0000 mg | ORAL_CAPSULE | Freq: Once | ORAL | Status: DC

## 2022-12-05 MED ORDER — ACETAMINOPHEN 325 MG PO TABS
650.0000 mg | ORAL_TABLET | Freq: Once | ORAL | Status: AC
  Administered 2022-12-05: 650 mg via ORAL
  Filled 2022-12-05: qty 2

## 2022-12-05 MED FILL — Dexamethasone Sodium Phosphate Inj 100 MG/10ML: INTRAMUSCULAR | Qty: 1 | Status: AC

## 2022-12-05 MED FILL — Fosaprepitant Dimeglumine For IV Infusion 150 MG (Base Eq): INTRAVENOUS | Qty: 5 | Status: AC

## 2022-12-05 NOTE — Patient Instructions (Signed)

## 2022-12-06 NOTE — Progress Notes (Unsigned)
[facility] OFF[facility] PROGRESS NOTE  [name], NP [place] [address]  DIAGNOSIS:  Relapsed extensive stage (T4, N3, M1c)  small cell lung cancer diagnosed in April 2021 and presented with large right upper lobe/right hilar mass with ipsilateral and contralateral mediastinal and right supraclavicular lymphadenopathy in addition to multiple liver lesions. He has disease progression after the first line of chemotherapy in December 2021.    PRIOR THERAPY: 1) Palliative radiotherapy to the right upper lobe lung mass under the care of Dr. Lisbeth Renshaw. 2) Systemic chemotherapy with carboplatin for AUC of 5 on day 1, etoposide 100 mg/M2 on days 1, 2 and 3 in addition to Imfinzi 1500 mg IV every 3 weeks with chemotherapy then every 4 weeks for maintenance if the patient has no evidence for progression.  He will also receive Cosela 240 mg/m2 on the days of the chemotherapy.  Status post 9 cycles.  Starting from cycle #5 the patient will be on maintenance treatment with immunotherapy with Imfinzi 1500 mg IV every 4 weeks. Last dose of chemotherapy was given on November 13, 2020. This treatment was discontinued secondary to disease progression 3) Systemic chemotherapy with carboplatin for AUC of 5 on day 1, etoposide 100 mg/M2 on days 1, 2 and 3, Tecentriq 1200 mg IV every 3 weeks as well as Cosela 250 mg/M2 on the days of the chemotherapy every 3 weeks.  First dose December 18, 2020.  Status post 8 cycles. 4) Zepzelca 3.2 mgm2 IV every 3 weeks. Last dose on 01/23/22. Status post 12 cycles 5) Palliative systemic chemotherapy with irinotecan 65 mg/m2 on days 1 and 8 IV every 3 weeks.  Status post 3 cycles.  Last dose was given April 01, 2022 discontinued secondary to disease progression  CURRENT THERAPY: Re-trialing palliative systemic chemotherapy with carboplatin for an AUC of 5 on day 1, etoposide 100 mg per metered squared on days 1, 2, and 3 with Cosela before chemotherapy.   First dose on 05/28/22.  Status post 9 cycles.  Until the patient can enroll in a clinical trial for a better option.   INTERVAL HISTORY: Adam Wise 71 y.o. male returns to the clinic today for a follow-up visit accompanied by his wife. The patient was last seen by Dr. Julien Nordmann on 11/18/22.  The patient had  a restaging CT scan at his last appointment which showed enlarging liver lesions. The patient is scheduled to see Dr. Mariel Kansky at [facility] on 1/8 to see if eligable to enroll in a clinical trial. In the meantime, he met with radiation oncology who are going to arrange SBRT to the liver lesion on 1/16. We have delayed the patient's next infusion until after this can be completed. He is scheduled for fiducial placement on 12/17/22.  Also in the interval since last being seen, the patient's dizziness has improved after having the Epley maneuver performed x 2.  The patient has chronic back and hip pain.  He was previously seen by Dr. Mardelle Matte.  Patient has tried gabapentin, Voltaren, lidocaine patches, steroid injections, etc. without any significant relief.  I gave the patient tramadol a few months ago until he could see his orthopedic provider for additional recommendations.  The patient took a tramadol this morning without any significant relief.  He has not tried taking Tylenol today.  He reports that the pain is radiating down his right leg and he localizes it more to his hip.  Denies any numbness and tingling in his  lower extremity, loss of bowel or bladder function.  He has not received a steroid injection in the last few months.  Patient has back braces at home but they no longer fit and he needs another custom fit back brace.  He denies fevers, chills, night sweats, or unexplained weight loss.  Denies any cough or hemoptysis.  Follows closely with pulmonary medicine for his pleural effusions. He denies any abnormal bleeding or bruising. He is on a blood thinner. Denies any nausea, vomiting, or diarrhea.  He  denies constipation. Denies any headache or visual changes. He had some cytopenias last week.  He received blood transfusion with 2 units of blood on Friday.  He is here today for evaluation and repeat blood work before starting cycle #10    MEDICAL HISTORY: Past Medical History:  Diagnosis Date   Chickenpox    Chronic knee pain    Chronic low back pain    COPD exacerbation ([facility]) 10/11/2021   COPD with exacerbation ([facility]) 10/12/2021   Coronary artery calcification seen on CAT scan 11/2021   Coronary CTA 11/27/2021: Moderate to large right pleural effusion and compressive atelectasis right lung base. => Coronary Calcium Score 657.  Diffuse RCA calcification.  Minimal mild disease in the LAD and diagonal branches. == Overall limited study.  Notable artifact.   Essential hypertension    GERD (gastroesophageal reflux disease)    Hyperlipidemia    Malignant neoplasm of upper lobe of right lung ([facility]) 04/04/2020   OSA (obstructive sleep apnea)    With nighttime oxygen supplementation   T4, M3, M1 C Metastatic Small Cell Lung Cancer 03/2020   large right upper lobe/right hilar mass with ipsilateral and contralateral mediastinal and right supraclavicular lymphadenopathy in addition to multiple liver lesios. He has disease progression after the first line of chemotherapy in December 2021.   Testosterone deficiency    Type 2 diabetes mellitus (HCC)     ALLERGIES:  is allergic to bupropion and hydrochlorothiazide.  MEDICATIONS:  Current Outpatient Medications  Medication Sig Dispense Refill   albuterol (VENTOLIN HFA) 108 (90 Base) MCG/ACT inhaler Inhale 2 puffs into the lungs every 6 (six) hours as needed for wheezing or shortness of breath. 18 g 0   amLODipine (NORVASC) 10 MG tablet TAKE 1 TABLET BY MOUTH EVERY DAY FOR BLOOD PRESSURE 90 tablet 1   B-D 3CC LUER-LOK SYR 22GX1" 22G X 1" 3 ML MISC USE AS INSTRUCTED FOR TESTOSTERONE INJECTION EVERY 2 WEEKS 10 each 2   bisoprolol (ZEBETA) 5 MG tablet  TAKE 1 TABLET (5 MG TOTAL) BY MOUTH DAILY. 90 tablet 2   Black Pepper-Turmeric (TURMERIC COMPLEX/BLACK PEPPER PO) Take 1 tablet by mouth in the morning and at bedtime.     blood glucose meter kit and supplies KIT Dispense based on patient and insurance preference. Use up to four times daily as directed. 1 each 0   Budeson-Glycopyrrol-Formoterol (BREZTRI AEROSPHERE) 160-9-4.8 MCG/ACT AERO Inhale 2 puffs into the lungs in the morning and at bedtime. 10.7 g 3   chlorpheniramine-HYDROcodone (TUSSIONEX) 10-8 MG/5ML Take 5 mLs by mouth every 12 (twelve) hours as needed for cough. 473 mL 0   Coenzyme Q10 (COQ10) 200 MG CAPS Take 200 mg by mouth at bedtime.     ELIQUIS 5 MG TABS tablet TAKE 1 TABLET BY MOUTH TWICE A DAY 180 tablet 2   esomeprazole (NEXIUM) 20 MG capsule Take 20 mg by mouth in the morning.     gabapentin (NEURONTIN) 300 MG capsule TAKE 1 CAPSULE (  300 MG TOTAL) BY MOUTH 2 (TWO) TIMES DAILY. FOR BACK PAIN. 180 capsule 2   glipiZIDE (GLUCOTROL) 10 MG tablet TAKE 1 TABLET (10 MG TOTAL) BY MOUTH 2 (TWO) TIMES DAILY BEFORE A MEAL. FOR DIABETES. 180 tablet 1   glucose blood (ONETOUCH ULTRA) test strip USE UP TO 4 TIMES DAILY AS DIRECTED 400 strip 5   Insulin Glargine (BASAGLAR KWIKPEN) 100 UNIT/ML Inject 24 Units into the skin at bedtime. For diabetes. 30 mL 1   Insulin Pen Needle (BD PEN NEEDLE NANO U/F) 32G X 4 MM MISC Use with insulin as prescribed Dx Code: E11.9 200 each 3   ipratropium-albuterol (DUONEB) 0.5-2.5 (3) MG/3ML SOLN Take 3 mLs by nebulization every 4 (four) hours while awake for 3 days, THEN 3 mLs every 4 (four) hours as needed (shortness of breath or wheezing). 360 mL 0   KRILL OIL PO Take 1 capsule by mouth daily.     Lancets MISC USE UP TO 3 TIMES DAILY AS DIRECTED 100 each 2   losartan (COZAAR) 50 MG tablet TAKE 1 TABLET (50 MG TOTAL) BY MOUTH DAILY. FOR BLOOD PRESSURE. 90 tablet 2   meclizine (ANTIVERT) 12.5 MG tablet Take 1 tablet (12.5 mg total) by mouth 3 (three) times  daily as needed for dizziness. (Patient not taking: Reported on 11/12/2022) 30 tablet 1   Melatonin 10 MG CAPS Take 10 mg by mouth at bedtime.     metFORMIN (GLUCOPHAGE) 1000 MG tablet TAKE 1 TABLET (1,000 MG TOTAL) BY MOUTH 2 (TWO) TIMES DAILY WITH A MEAL. FOR DIABETES 180 tablet 1   Multiple Vitamin (MULTI-VITAMINS) TABS Take 1 tablet by mouth in the morning.     oxymetazoline (AFRIN) 0.05 % nasal spray Place 1 spray into both nostrils daily as needed for congestion.     pioglitazone (ACTOS) 45 MG tablet TAKE 1 TABLET (45 MG TOTAL) BY MOUTH DAILY. FOR DIABETES. 90 tablet 1   pravastatin (PRAVACHOL) 40 MG tablet TAKE 1 TABLET BY MOUTH EVERY DAY IN THE EVENING FOR CHOLESTEROL 90 tablet 2   testosterone cypionate (DEPOTESTOSTERONE CYPIONATE) 200 MG/ML injection Inject 200 mg into the muscle every 14 (fourteen) days.     traMADol (ULTRAM) 50 MG tablet Take 1 tablet (50 mg total) by mouth every 6 (six) hours as needed. 15 tablet 0   No current facility-administered medications for this visit.    SURGICAL HISTORY:  Past Surgical History:  Procedure Laterality Date   CHEST TUBE INSERTION Right 01/20/2022   Procedure: CHEST TUBE INSERTION;  Surgeon: Candee Furbish, MD;  Location: [facility] ENDOSCOPY;  Service: Pulmonary;  Laterality: Right;  w/ Talc Pleurodesis, planned admit for Obs afterwards   CHEST TUBE INSERTION Right 04/28/2022   Procedure: INSERTION PLEURAL DRAINAGE CATHETER - Pigtail tail, drainage;  Surgeon: Candee Furbish, MD;  Location: [facility] ENDOSCOPY;  Service: Pulmonary;  Laterality: Right;  talc pleurodesis   CHEST TUBE INSERTION Right 09/01/2022   Procedure: INSERTION PLEURAL DRAINAGE CATHETER;  Surgeon: Juanito Doom, MD;  Location: [facility];  Service: Cardiopulmonary;  Laterality: Right;  pigtail catheter placement with talc pleurodesis   COLONOSCOPY WITH PROPOFOL N/A 12/17/2018   Procedure: COLONOSCOPY WITH PROPOFOL;  Surgeon: Virgel Manifold, MD;  Location: [facility] ENDOSCOPY;   Service: Gastroenterology;  Laterality: N/A;   IR IMAGING GUIDED PORT INSERTION  04/17/2020   IR THORACENTESIS ASP PLEURAL SPACE W/IMG GUIDE  11/29/2021   IR THORACENTESIS ASP PLEURAL SPACE W/IMG GUIDE  01/03/2022   JOINT REPLACEMENT Bilateral  REPLACEMENT TOTAL KNEE BILATERAL  2015   TALC PLEURODESIS  09/01/2022   Procedure: Pietro Cassis;  Surgeon: Juanito Doom, MD;  Location: [facility] ENDOSCOPY;  Service: Cardiopulmonary;;   TONSILLECTOMY  1960   TRANSTHORACIC ECHOCARDIOGRAM  05/2020   a) 05/2020: EF 55 to 60%.  No R WMA.  Mild LVH.  Indeterminate LVEDP.  Unable to assess RVP.  Normal aortic and mitral valves.  Mildly elevated RAP.; b) 09/2021: EF 50-55%. No RWMA. Mild LVH. ~ LVEDP. Mild LA Dilation. NORMAL RV/RAP.  Normal MV/AoV.    REVIEW OF SYSTEMS:   Review of Systems  Constitutional: Negative for appetite change, chills, fatigue, fever and unexpected weight change.  HENT:   Negative for mouth sores, nosebleeds, sore throat and trouble swallowing.   Eyes: Negative for eye problems and icterus.  Respiratory: Positive for stable dyspnea on exertion.  Negative for cough, hemoptysis, and wheezing.   Cardiovascular: Negative for chest pain and leg swelling.  Gastrointestinal: Negative for abdominal pain, constipation, diarrhea, nausea and vomiting.  Genitourinary: Negative for bladder incontinence, difficulty urinating, dysuria, frequency and hematuria.   Musculoskeletal: Positive for chronic back pain and right hip pain.  Negative for gait problem, neck pain and neck stiffness.  Skin: Negative for itching and rash.  Neurological: Negative for dizziness, extremity weakness, gait problem, headaches, light-headedness and seizures.  Hematological: Negative for adenopathy. Does not bruise/bleed easily.  Psychiatric/Behavioral: Negative for confusion, depression and sleep disturbance. The patient is not nervous/anxious.     PHYSICAL EXAMINATION:  Blood pressure 133/72, pulse 80,  temperature 98.1 F (36.7 C), temperature source Oral, resp. rate 19, height _0  (1.727 m), weight 299 lb 8 oz (135.9 kg), SpO2 95 %.  ECOG PERFORMANCE STATUS: 1-2  Physical Exam  Constitutional: Oriented to person, place, and time and well-developed, well-nourished, and in no distress.  HENT:  Head: Normocephalic and atraumatic.  Mouth/Throat: Oropharynx is clear and moist. No oropharyngeal exudate.  Eyes: Conjunctivae are normal. Right eye exhibits no discharge. Left eye exhibits no discharge. No scleral icterus.  Neck: Normal range of motion. Neck supple.  Cardiovascular: Normal rate, regular rhythm, normal heart sounds and intact distal pulses.   Pulmonary/Chest: Effort normal and breath sounds normal. No respiratory distress. No wheezes. No rales.  Abdominal: Soft. Bowel sounds are normal. Exhibits no distension and no mass. There is no tenderness.  Musculoskeletal: Normal range of motion. Exhibits no edema.  Lymphadenopathy:    No cervical adenopathy.  Neurological: Alert and oriented to person, place, and time. Exhibits normal muscle tone. Examined in the wheelchair.  Skin: Skin is warm and dry. No rash noted. Not diaphoretic. No erythema. No pallor.  Psychiatric: Mood, memory and judgment normal.  Vitals reviewed.    LABORATORY DATA: Lab Results  Component Value Date   WBC 2.8 (L) 12/09/2022   HGB 10.0 (L) 12/09/2022   HCT 30.9 (L) 12/09/2022   MCV 96.6 12/09/2022   PLT 144 (L) 12/09/2022      Chemistry      Component Value Date/Time   NA 140 12/02/2022 1130   K 4.0 12/02/2022 1130   CL 103 12/02/2022 1130   CO2 27 12/02/2022 1130   BUN 20 12/02/2022 1130   CREATININE 1.09 12/02/2022 1130   CREATININE 1.45 (H) 10/18/2021 1533      Component Value Date/Time   CALCIUM 8.2 (L) 12/02/2022 1130   ALKPHOS 54 12/02/2022 1130   AST 17 12/02/2022 1130   ALT 19 12/02/2022 1130   BILITOT 0.5 12/02/2022 1130  RADIOGRAPHIC STUDIES:  MR LIVER W WO  CONTRAST  Result Date: 12/04/2022 CLINICAL DATA:  71 year old male with history of liver lesion noted on prior CT examinations. History of metastatic small cell lung cancer. Follow-up study. EXAM: MRI ABDOMEN WITHOUT AND WITH CONTRAST TECHNIQUE: Multiplanar multisequence MR imaging of the abdomen was performed both before and after the administration of intravenous contrast. CONTRAST:  66m GADAVIST GADOBUTROL 1 MMOL/ML IV SOLN COMPARISON:  Abdominal MRI 04/11/2020. FINDINGS: Lower chest: Small right pleural effusion lying dependently. Hepatobiliary: In the right lobe of the liver involving segments 5 and 8 (axial image 21 of series 4 and coronal image 16 of series 3) there is a well-defined 5.3 x 3.9 x 4.2 cm lesion which is low T1 signal intensity, high T2 signal intensity, demonstrates diffusion restriction and is hypovascular with heterogeneous enhancement on post gadolinium imaging, most compatible with a metastatic lesion. More inferiorly within segment 5 there is a subcentimeter lesion with similar imaging characteristics (axial image 47 of series 17), compatible with an additional metastasis. Several other smaller lesions which are also mildly T1 hypointense and T2 hyperintense, hypovascular in appearance, but with less apparent diffusion restriction are noted, likely to reflect treated metastatic lesions, largest of which is in the in the superior aspect of the right lobe of the liver between segments 7 and 8 (axial image 17 of series 17) measuring 1.9 x 1.2 cm. Another of these lesions is in the caudate lobe of the liver measuring 1.6 x 1.2 cm (axial image 19 of series 4). No intra or extrahepatic biliary ductal dilatation. Gallbladder is unremarkable in appearance. Pancreas: No pancreatic mass. No pancreatic ductal dilatation. No pancreatic or peripancreatic fluid collections or inflammatory changes. Spleen:  Unremarkable. Adrenals/Urinary Tract: Multiple T1 hypointense, T2 hyperintense, nonenhancing  lesions in the right kidney are compatible with simple cysts, largest of which is in the interpolar region measuring up to 7.2 cm in diameter. Other subcentimeter T1 hypointense, T2 hyperintense, nonenhancing lesions in both kidneys are also compatible with tiny simple cysts. No aggressive appearing renal lesions. No hydroureteronephrosis in the visualized portions of the abdomen. In the medial limb of the right adrenal gland (axial image 33 of series 17) there is again a 1.3 cm nodule which demonstrates low-level diffusion restriction, nonspecific, but stable in size compared to the prior study from 04/11/2020, with loss of signal intensity on out of phase dual echo images, indicative of a benign adenoma. Stomach/Bowel: Visualized portions are unremarkable. Vascular/Lymphatic: No aneurysm identified in the visualized abdominal vasculature. No lymphadenopathy noted in the abdomen. Other: No significant volume of ascites noted in the visualized portions of the peritoneal cavity. Musculoskeletal: No aggressive appearing osseous lesions are noted in the visualized portions of the skeleton. IMPRESSION: 1. In addition to what appear to be old treated metastatic lesions, there 2 new liver lesions (corresponding to the suspicious lesion noted on recent CT examination), which are most compatible with new/progressive hepatic metastases. The largest of these measures 5.3 x 3.9 x 4.2 cm centered in segments 5 and 8. 2. Small right pleural effusion lying dependently. 3. Small right adrenal adenoma again noted. 4. Multiple simple cysts in the kidneys bilaterally (no imaging follow-up recommended). Electronically Signed   By: DVinnie LangtonM.D.   On: 12/04/2022 07:18   CT Chest W Contrast  Result Date: 11/15/2022 CLINICAL DATA:  Metastatic small-cell lung cancer, liver metastases, assess treatment response * Tracking Code: BO * EXAM: CT CHEST, ABDOMEN, AND PELVIS WITH CONTRAST TECHNIQUE: Multidetector CT imaging of  the  chest, abdomen and pelvis was performed following the standard protocol during bolus administration of intravenous contrast. RADIATION DOSE REDUCTION: This exam was performed according to the departmental dose-optimization program which includes automated exposure control, adjustment of the mA and/or kV according to patient size and/or use of iterative reconstruction technique. CONTRAST:  161m OMNIPAQUE IOHEXOL 300 MG/ML  SOLN COMPARISON:  09/29/2022 FINDINGS: CT CHEST FINDINGS Cardiovascular: Right chest port catheter. Aortic atherosclerosis. Normal heart size. Left and right coronary artery calcifications. No pericardial effusion. Mediastinum/Nodes: Unchanged post treatment soft tissue thickening about the right hilum. No discretely enlarged mediastinal, hilar, or axillary lymph nodes. Thyroid gland, trachea, and esophagus demonstrate no significant findings. Lungs/Pleura: Unchanged post treatment/post radiation appearance of the right lung with dense perihilar and suprahilar consolidation and fibrosis (series 6, image 43). Unchanged small right pleural effusion with some associated pleural thickening and most likely prior talc pleurodesis (series 2, image 51). Musculoskeletal: No chest wall abnormality. No acute osseous findings. CT ABDOMEN PELVIS FINDINGS Hepatobiliary: Continued enlargement of a hypodense lesion of the anterior right lobe of the liver, hepatic segment V, measuring 5.1 x 3.9 cm, previously 3.3 x 2.9 cm (series 2, image 52). Additional hypodense liver lesions are unchanged, including of the liver dome, hepatic segment VII (series 2, image 40) and the caudate (series 2, image 51). No gallstones, gallbladder wall thickening, or biliary dilatation. Pancreas: Unremarkable. No pancreatic ductal dilatation or surrounding inflammatory changes. Spleen: Normal in size without significant abnormality. Adrenals/Urinary Tract: Unchanged small nodule of the medial limb of the right adrenal gland measuring  1.5 x 1.1 cm, presumed benign adenoma (series 2, image 52). Left adrenal gland unremarkable. Simple, benign renal cortical cysts, larger and more numerous on the right, for which no further follow-up or characterization is required. Kidneys are otherwise normal, without renal calculi, solid lesion, or hydronephrosis. Bladder is unremarkable. Stomach/Bowel: Stomach is within normal limits. Appendix appears normal. No evidence of bowel wall thickening, distention, or inflammatory changes. Sigmoid diverticula. Vascular/Lymphatic: Aortic atherosclerosis. No enlarged abdominal or pelvic lymph nodes. Reproductive: Mild prostatomegaly. Other: No abdominal wall hernia or abnormality. No ascites. Musculoskeletal: No acute osseous findings. IMPRESSION: 1. Continued enlargement of a hypodense lesion of the anterior right lobe of the liver, hepatic segment V, consistent with worsened hepatic metastatic disease. Additional hypodense liver lesions are unchanged. 2. Unchanged post treatment/post radiation appearance of the right lung with dense perihilar and suprahilar consolidation and fibrosis. 3. Unchanged small right pleural effusion with some associated pleural thickening and evidence of prior talc pleurodesis. 4. No evidence of new metastatic disease in the chest, abdomen, or pelvis. 5. Coronary artery disease. 6. Mild prostatomegaly. Aortic Atherosclerosis (ICD10-I70.0). Electronically Signed   By: ADelanna AhmadiM.D.   On: 11/15/2022 15:19   CT Abdomen Pelvis W Contrast  Result Date: 11/15/2022 CLINICAL DATA:  Metastatic small-cell lung cancer, liver metastases, assess treatment response * Tracking Code: BO * EXAM: CT CHEST, ABDOMEN, AND PELVIS WITH CONTRAST TECHNIQUE: Multidetector CT imaging of the chest, abdomen and pelvis was performed following the standard protocol during bolus administration of intravenous contrast. RADIATION DOSE REDUCTION: This exam was performed according to the departmental dose-optimization  program which includes automated exposure control, adjustment of the mA and/or kV according to patient size and/or use of iterative reconstruction technique. CONTRAST:  1081mOMNIPAQUE IOHEXOL 300 MG/ML  SOLN COMPARISON:  09/29/2022 FINDINGS: CT CHEST FINDINGS Cardiovascular: Right chest port catheter. Aortic atherosclerosis. Normal heart size. Left and right coronary artery calcifications. No pericardial effusion. Mediastinum/Nodes: Unchanged  post treatment soft tissue thickening about the right hilum. No discretely enlarged mediastinal, hilar, or axillary lymph nodes. Thyroid gland, trachea, and esophagus demonstrate no significant findings. Lungs/Pleura: Unchanged post treatment/post radiation appearance of the right lung with dense perihilar and suprahilar consolidation and fibrosis (series 6, image 43). Unchanged small right pleural effusion with some associated pleural thickening and most likely prior talc pleurodesis (series 2, image 51). Musculoskeletal: No chest wall abnormality. No acute osseous findings. CT ABDOMEN PELVIS FINDINGS Hepatobiliary: Continued enlargement of a hypodense lesion of the anterior right lobe of the liver, hepatic segment V, measuring 5.1 x 3.9 cm, previously 3.3 x 2.9 cm (series 2, image 52). Additional hypodense liver lesions are unchanged, including of the liver dome, hepatic segment VII (series 2, image 40) and the caudate (series 2, image 51). No gallstones, gallbladder wall thickening, or biliary dilatation. Pancreas: Unremarkable. No pancreatic ductal dilatation or surrounding inflammatory changes. Spleen: Normal in size without significant abnormality. Adrenals/Urinary Tract: Unchanged small nodule of the medial limb of the right adrenal gland measuring 1.5 x 1.1 cm, presumed benign adenoma (series 2, image 52). Left adrenal gland unremarkable. Simple, benign renal cortical cysts, larger and more numerous on the right, for which no further follow-up or characterization is  required. Kidneys are otherwise normal, without renal calculi, solid lesion, or hydronephrosis. Bladder is unremarkable. Stomach/Bowel: Stomach is within normal limits. Appendix appears normal. No evidence of bowel wall thickening, distention, or inflammatory changes. Sigmoid diverticula. Vascular/Lymphatic: Aortic atherosclerosis. No enlarged abdominal or pelvic lymph nodes. Reproductive: Mild prostatomegaly. Other: No abdominal wall hernia or abnormality. No ascites. Musculoskeletal: No acute osseous findings. IMPRESSION: 1. Continued enlargement of a hypodense lesion of the anterior right lobe of the liver, hepatic segment V, consistent with worsened hepatic metastatic disease. Additional hypodense liver lesions are unchanged. 2. Unchanged post treatment/post radiation appearance of the right lung with dense perihilar and suprahilar consolidation and fibrosis. 3. Unchanged small right pleural effusion with some associated pleural thickening and evidence of prior talc pleurodesis. 4. No evidence of new metastatic disease in the chest, abdomen, or pelvis. 5. Coronary artery disease. 6. Mild prostatomegaly. Aortic Atherosclerosis (ICD10-I70.0). Electronically Signed   By: Delanna Ahmadi M.D.   On: 11/15/2022 15:19     ASSESSMENT/PLAN:  This is a very pleasant 71 year old Caucasian male diagnosed with extensive stage (T4, N3, M1C) small cell lung cancer presented with large right upper lobe lung mass in addition to mediastinal and right supraclavicular lymphadenopathy and multiple metastatic liver lesions diagnosed in April 2021.   The patient initially underwent systemic chemotherapy with carboplatin for an AUC of 5 on day 1, etoposide 100 mg per metered squared on days 1, 2, and 3 in addition to [facility] for myeloprotection.  He also received immunotherapy with Imfinzi on day 1 of every chemotherapy cycle.  He is status post 8 cycles.  Starting from cycle #5 he was on single agent immunotherapy with Imfinzi IV  every 4 weeks   The patient had evidence of disease progression on his scan from December 2021.  He was then started on systemic chemotherapy with carboplatin for an AUC of 5 on day 1, etoposide 100 mg per metered squared on days 1, 2, and 3 in addition to Tecentriq 1200 mg on day 1, the patient is status post 8 cycles.  Starting from cycle #5, the patient started maintenance immunotherapy with Tecentriq.  This was discontinued due to evidence of disease progression.   The patient then underwent Zepzelca IV every  3 weeks.  He is status post his 12 cycles and he tolerated it well except for fatigue a few days after treatment.  This was discontinued in February 2023 due to evidence of disease progression.    The patient then underwent treatment with irinotecan 65 mg per metered squared on days 1 and 8 IV every 3 weeks.  The patient is status post 3 cycles and tolerated it fairly well without any concerning adverse side effects.  This was discontinued secondary to disease progression.   Dr. Julien Nordmann saw the patient after this to discuss the options including palliative care and hospice versus single agent Gemzar, or Taxol.  He also discussed repeating his initial treatment with carboplatin and etoposide since he had a good response to treatment in the past.   He was referred to Dr. Mariel Kansky at [facility]  for consideration of enrollment in the clinical trial with tarlatamab.    He also saw Dr. Mindi Junker at [facility] for consideration of enrollment in the [id number] [id number] trial.  The patient was not interested due to the travel burden.  The patient was interested in restarting systemic chemotherapy with carboplatin for an AUC of 5 on day 1 and etoposide 100 mg per metered squared on days 1, 2, and 3 with Cosela. He is status post 9 cycles and tolerated it well. His dose of carboplatin was reduced to an AUC of 4 and etoposide 80 mg per metered squared.    At the patient's last appointment, his restaging CT scan  from December showed enlarging liver lesions. He is expected to undergo SBRT to this on 1/16 under the care of Dr. Lisbeth Renshaw.  He is scheduled for fiducial marker placement on 12/17/2021.  I will move his lab appointment from 1/2 to 1/3 due to the travel burden.  The patient is scheduled to see Dr. Mariel Kansky at [facility] on 1/8 to see if he is eligible for any trials.   Due to the lack of any good options, Dr. Julien Nordmann recommended he continue on his systemic therapy for now.   Labs were reviewed.  His labs have recovered since last week.  Hemoglobin 10.0 today.  Will arrange for weekly sample to blood bank in the event that he requires a blood transfusion in the interval.  Recommend that he proceed with cycle #10 today as scheduled.   Radiation oncology is planning on performing SBRT on 1/16 and have asked that cycle #11 be delayed by 1 week. Therefore, we will see him back in 4 weeks before starting the next cycle of treatment unless he enrolls in clinical trials at [facility].   Regarding the patient's chronic back and hip pain he has seen orthopedics in the past.  He has had a knee replacement.  He has received steroid injections in the past family significant improvement.  He uses Voltaren, gabapentin, lidocaine patches, and tramadol.  I gave him the tramadol in September 2023 until he could see his orthopedic provider for additional recommendations.  He has not seen them at this time.  Will arrange for him to receive 650 mg Tylenol in the infusion room today.  Recommended that the patient be seen at the [facility] urgent care to see if he would qualify for any repeat steroid injections, x-ray, or other recommendations.  I asked the patient's wife to ask them if they would have the resources to help him get a custom/fitted back brace.  Of note, the patient has been having some ongoing pain in his back/hip.  He recently had a restaging scan which did not show any suspicious osseous lesions in the pelvis or spine.  The patient  also states he has a history of having a "tight" IT band.   The patient was advised to call immediately if she has any concerning symptoms in the interval. The patient voices understanding of current disease status and treatment options and is in agreement with the current care plan. All questions were answered. The patient knows to call the clinic with any problems, questions or concerns. We can certainly see the patient much sooner if necessary     No orders of the defined types were placed in this encounter.    The total time spent in the appointment was 30-39 minutes.   Bronson Bressman L Leemon Ayala, PA-C 12/09/22

## 2022-12-08 LAB — BPAM RBC
Blood Product Expiration Date: 202401122359
Blood Product Expiration Date: 202401122359
ISSUE DATE / TIME: 202312220855
ISSUE DATE / TIME: 202312220901
Unit Type and Rh: 7300
Unit Type and Rh: 7300

## 2022-12-08 LAB — TYPE AND SCREEN
ABO/RH(D): B POS
Antibody Screen: NEGATIVE
Unit division: 0
Unit division: 0

## 2022-12-09 ENCOUNTER — Other Ambulatory Visit: Payer: Self-pay | Admitting: Physician Assistant

## 2022-12-09 ENCOUNTER — Inpatient Hospital Stay: Payer: Medicare Other

## 2022-12-09 ENCOUNTER — Telehealth: Payer: Self-pay

## 2022-12-09 ENCOUNTER — Inpatient Hospital Stay (HOSPITAL_BASED_OUTPATIENT_CLINIC_OR_DEPARTMENT_OTHER): Payer: Medicare Other | Admitting: Physician Assistant

## 2022-12-09 ENCOUNTER — Other Ambulatory Visit: Payer: Medicare Other

## 2022-12-09 VITALS — BP 133/72 | HR 80 | Temp 98.1°F | Resp 19 | Ht 68.0 in | Wt 299.5 lb

## 2022-12-09 VITALS — BP 129/70 | HR 78 | Resp 20

## 2022-12-09 DIAGNOSIS — M25551 Pain in right hip: Secondary | ICD-10-CM

## 2022-12-09 DIAGNOSIS — D649 Anemia, unspecified: Secondary | ICD-10-CM

## 2022-12-09 DIAGNOSIS — Z5111 Encounter for antineoplastic chemotherapy: Secondary | ICD-10-CM | POA: Diagnosis not present

## 2022-12-09 DIAGNOSIS — Z7984 Long term (current) use of oral hypoglycemic drugs: Secondary | ICD-10-CM | POA: Diagnosis not present

## 2022-12-09 DIAGNOSIS — C3411 Malignant neoplasm of upper lobe, right bronchus or lung: Secondary | ICD-10-CM

## 2022-12-09 DIAGNOSIS — Z95828 Presence of other vascular implants and grafts: Secondary | ICD-10-CM

## 2022-12-09 DIAGNOSIS — M25559 Pain in unspecified hip: Secondary | ICD-10-CM | POA: Insufficient documentation

## 2022-12-09 DIAGNOSIS — Z79899 Other long term (current) drug therapy: Secondary | ICD-10-CM | POA: Diagnosis not present

## 2022-12-09 DIAGNOSIS — J90 Pleural effusion, not elsewhere classified: Secondary | ICD-10-CM | POA: Diagnosis not present

## 2022-12-09 DIAGNOSIS — C787 Secondary malignant neoplasm of liver and intrahepatic bile duct: Secondary | ICD-10-CM | POA: Diagnosis not present

## 2022-12-09 LAB — CMP (CANCER CENTER ONLY)
ALT: 14 U/L (ref 0–44)
AST: 16 U/L (ref 15–41)
Albumin: 3.8 g/dL (ref 3.5–5.0)
Alkaline Phosphatase: 60 U/L (ref 38–126)
Anion gap: 9 (ref 5–15)
BUN: 15 mg/dL (ref 8–23)
CO2: 27 mmol/L (ref 22–32)
Calcium: 8.3 mg/dL — ABNORMAL LOW (ref 8.9–10.3)
Chloride: 104 mmol/L (ref 98–111)
Creatinine: 1.09 mg/dL (ref 0.61–1.24)
GFR, Estimated: >60 mL/min (ref >60)
Glucose, Bld: 146 mg/dL — ABNORMAL HIGH (ref 70–99)
Potassium: 4.1 mmol/L (ref 3.5–5.1)
Sodium: 140 mmol/L (ref 135–145)
Total Bilirubin: 0.4 mg/dL (ref 0.3–1.2)
Total Protein: 6.4 g/dL — ABNORMAL LOW (ref 6.5–8.1)

## 2022-12-09 LAB — CBC WITH DIFFERENTIAL (CANCER CENTER ONLY)
Abs Immature Granulocytes: 0.03 10*3/uL (ref 0.00–0.07)
Basophils Absolute: 0 10*3/uL (ref 0.0–0.1)
Basophils Relative: 1 %
Eosinophils Absolute: 0.1 10*3/uL (ref 0.0–0.5)
Eosinophils Relative: 2 %
HCT: 30.9 % — ABNORMAL LOW (ref 39.0–52.0)
Hemoglobin: 10 g/dL — ABNORMAL LOW (ref 13.0–17.0)
Immature Granulocytes: 1 %
Lymphocytes Relative: 14 %
Lymphs Abs: 0.4 10*3/uL — ABNORMAL LOW (ref 0.7–4.0)
MCH: 31.3 pg (ref 26.0–34.0)
MCHC: 32.4 g/dL (ref 30.0–36.0)
MCV: 96.6 fL (ref 80.0–100.0)
Monocytes Absolute: 0.5 10*3/uL (ref 0.1–1.0)
Monocytes Relative: 18 %
Neutro Abs: 1.8 10*3/uL (ref 1.7–7.7)
Neutrophils Relative %: 64 %
Platelet Count: 144 10*3/uL — ABNORMAL LOW (ref 150–400)
RBC: 3.2 MIL/uL — ABNORMAL LOW (ref 4.22–5.81)
RDW: 17.5 % — ABNORMAL HIGH (ref 11.5–15.5)
WBC Count: 2.8 10*3/uL — ABNORMAL LOW (ref 4.0–10.5)
nRBC: 0.7 % — ABNORMAL HIGH (ref 0.0–0.2)

## 2022-12-09 LAB — SAMPLE TO BLOOD BANK

## 2022-12-09 MED ORDER — FAMOTIDINE IN NACL 20-0.9 MG/50ML-% IV SOLN
20.0000 mg | Freq: Once | INTRAVENOUS | Status: AC
  Administered 2022-12-09: 20 mg via INTRAVENOUS
  Filled 2022-12-09: qty 50

## 2022-12-09 MED ORDER — SODIUM CHLORIDE 0.9 % IV SOLN: Freq: Once | INTRAVENOUS | Status: AC

## 2022-12-09 MED ORDER — SODIUM CHLORIDE 0.9 % IV SOLN
80.0000 mg/m2 | Freq: Once | INTRAVENOUS | Status: AC
  Administered 2022-12-09: 210 mg via INTRAVENOUS
  Filled 2022-12-09: qty 10.5

## 2022-12-09 MED ORDER — SODIUM CHLORIDE 0.9 % IV SOLN
530.0000 mg | Freq: Once | INTRAVENOUS | Status: AC
  Administered 2022-12-09: 530 mg via INTRAVENOUS
  Filled 2022-12-09: qty 53

## 2022-12-09 MED ORDER — MORPHINE SULFATE (PF) 2 MG/ML IV SOLN
1.0000 mg | Freq: Once | INTRAVENOUS | Status: AC
  Administered 2022-12-09: 1 mg via INTRAVENOUS
  Filled 2022-12-09: qty 1

## 2022-12-09 MED ORDER — ACETAMINOPHEN 325 MG PO TABS
650.0000 mg | ORAL_TABLET | Freq: Once | ORAL | Status: AC
  Administered 2022-12-09: 650 mg via ORAL
  Filled 2022-12-09: qty 2

## 2022-12-09 MED ORDER — SODIUM CHLORIDE 0.9% FLUSH
10.0000 mL | INTRAVENOUS | Status: DC | PRN
  Administered 2022-12-09: 10 mL

## 2022-12-09 MED ORDER — HEPARIN SOD (PORK) LOCK FLUSH 100 UNIT/ML IV SOLN
500.0000 [IU] | Freq: Once | INTRAVENOUS | Status: AC | PRN
  Administered 2022-12-09: 500 [IU]

## 2022-12-09 MED ORDER — SODIUM CHLORIDE 0.9% FLUSH
10.0000 mL | Freq: Once | INTRAVENOUS | Status: AC
  Administered 2022-12-09: 10 mL

## 2022-12-09 MED ORDER — SODIUM CHLORIDE 0.9 % IV SOLN
150.0000 mg | Freq: Once | INTRAVENOUS | Status: AC
  Administered 2022-12-09: 150 mg via INTRAVENOUS
  Filled 2022-12-09: qty 150

## 2022-12-09 MED ORDER — TRILACICLIB DIHYDROCHLORIDE INJECTION 300 MG
240.0000 mg/m2 | Freq: Once | INTRAVENOUS | Status: AC
  Administered 2022-12-09: 600 mg via INTRAVENOUS
  Filled 2022-12-09: qty 40

## 2022-12-09 MED ORDER — SODIUM CHLORIDE 0.9 % IV SOLN
10.0000 mg | Freq: Once | INTRAVENOUS | Status: AC
  Administered 2022-12-09: 10 mg via INTRAVENOUS
  Filled 2022-12-09: qty 10

## 2022-12-09 MED ORDER — PALONOSETRON HCL INJECTION 0.25 MG/5ML
0.2500 mg | Freq: Once | INTRAVENOUS | Status: AC
  Administered 2022-12-09: 0.25 mg via INTRAVENOUS
  Filled 2022-12-09: qty 5

## 2022-12-09 MED FILL — Dexamethasone Sodium Phosphate Inj 100 MG/10ML: INTRAMUSCULAR | Qty: 1 | Status: AC

## 2022-12-09 NOTE — Telephone Encounter (Signed)
This nurse reached out to patient and left a message and advised that his appointment for labs only that is scheduled for 1/2 will be moved to Wednesday 1/3 so that the patient does not have to make two separate trips to the hospital.  Advised patient to give [place] a call if the changed does not work for him.  Also sent a message on My Chart.  No other concerns at this time.

## 2022-12-09 NOTE — Patient Instructions (Signed)
[facility]  Discharge Instructions: Thank you for choosing [facility] to provide your oncology and hematology care.   If you have a lab appointment with the [facility], please go directly to the [facility] and check in at the registration area.   Wear comfortable clothing and clothing appropriate for easy access to any Portacath or PICC line.   We strive to give you quality time with your provider. You may need to reschedule your appointment if you arrive late (15 or more minutes).  Arriving late affects you and other patients whose appointments are after yours.  Also, if you miss three or more appointments without notifying the office, you may be dismissed from the clinic at the provider's discretion.      For prescription refill requests, have your pharmacy contact our office and allow 72 hours for refills to be completed.    Today you received the following chemotherapy and/or immunotherapy agents: Carboplatin and Etoposide      To help prevent nausea and vomiting after your treatment, we encourage you to take your nausea medication as directed.  BELOW ARE SYMPTOMS THAT SHOULD BE REPORTED IMMEDIATELY: *FEVER GREATER THAN 100.4 F (38 C) OR HIGHER *CHILLS OR SWEATING *NAUSEA AND VOMITING THAT IS NOT CONTROLLED WITH YOUR NAUSEA MEDICATION *UNUSUAL SHORTNESS OF BREATH *UNUSUAL BRUISING OR BLEEDING *URINARY PROBLEMS (pain or burning when urinating, or frequent urination) *BOWEL PROBLEMS (unusual diarrhea, constipation, pain near the anus) TENDERNESS IN MOUTH AND THROAT WITH OR WITHOUT PRESENCE OF ULCERS (sore throat, sores in mouth, or a toothache) UNUSUAL RASH, SWELLING OR PAIN  UNUSUAL VAGINAL DISCHARGE OR ITCHING   Items with * indicate a potential emergency and should be followed up as soon as possible or go to the Emergency Department if any problems should occur.  Please show the CHEMOTHERAPY ALERT CARD or IMMUNOTHERAPY ALERT CARD  at check-in to the Emergency Department and triage nurse.  Should you have questions after your visit or need to cancel or reschedule your appointment, please contact [facility]  Dept: [phone]  and follow the prompts.  Office hours are 8:00 a.m. to 4:30 p.m. Monday - Friday. Please note that voicemails left after 4:00 p.m. may not be returned until the following business day.  We are closed weekends and major holidays. You have access to a nurse at all times for urgent questions. Please call the main number to the clinic Dept: [phone] and follow the prompts.   For any non-urgent questions, you may also contact your provider using MyChart. We now offer e-Visits for anyone 19 and older to request care online for non-urgent symptoms. For details visit [email or web address].   Also download the MyChart app! Go to the app store, search "MyChart", open the app, select [facility], and log in with your MyChart username and password.  Masks are optional in the cancer centers. If you would like for your care team to wear a mask while they are taking care of you, please let them know. You may have one support person who is at least 72 years old accompany you for your appointments.

## 2022-12-10 ENCOUNTER — Inpatient Hospital Stay: Payer: Medicare Other

## 2022-12-10 VITALS — BP 135/77 | HR 88 | Temp 98.0°F | Resp 19

## 2022-12-10 DIAGNOSIS — C3411 Malignant neoplasm of upper lobe, right bronchus or lung: Secondary | ICD-10-CM | POA: Diagnosis not present

## 2022-12-10 DIAGNOSIS — Z7984 Long term (current) use of oral hypoglycemic drugs: Secondary | ICD-10-CM | POA: Diagnosis not present

## 2022-12-10 DIAGNOSIS — C787 Secondary malignant neoplasm of liver and intrahepatic bile duct: Secondary | ICD-10-CM | POA: Diagnosis not present

## 2022-12-10 DIAGNOSIS — M545 Low back pain, unspecified: Secondary | ICD-10-CM | POA: Diagnosis not present

## 2022-12-10 DIAGNOSIS — Z79899 Other long term (current) drug therapy: Secondary | ICD-10-CM | POA: Diagnosis not present

## 2022-12-10 DIAGNOSIS — Z5111 Encounter for antineoplastic chemotherapy: Secondary | ICD-10-CM | POA: Diagnosis not present

## 2022-12-10 DIAGNOSIS — J90 Pleural effusion, not elsewhere classified: Secondary | ICD-10-CM | POA: Diagnosis not present

## 2022-12-10 MED ORDER — SODIUM CHLORIDE 0.9 % IV SOLN
80.0000 mg/m2 | Freq: Once | INTRAVENOUS | Status: AC
  Administered 2022-12-10: 210 mg via INTRAVENOUS
  Filled 2022-12-10: qty 10.5

## 2022-12-10 MED ORDER — SODIUM CHLORIDE 0.9% FLUSH
10.0000 mL | INTRAVENOUS | Status: DC | PRN
  Administered 2022-12-10: 10 mL

## 2022-12-10 MED ORDER — SODIUM CHLORIDE 0.9 % IV SOLN
10.0000 mg | Freq: Once | INTRAVENOUS | Status: AC
  Administered 2022-12-10: 10 mg via INTRAVENOUS
  Filled 2022-12-10: qty 10

## 2022-12-10 MED ORDER — SODIUM CHLORIDE 0.9 % IV SOLN: Freq: Once | INTRAVENOUS | Status: AC

## 2022-12-10 MED ORDER — TRILACICLIB DIHYDROCHLORIDE INJECTION 300 MG
240.0000 mg/m2 | Freq: Once | INTRAVENOUS | Status: AC
  Administered 2022-12-10: 600 mg via INTRAVENOUS
  Filled 2022-12-10: qty 40

## 2022-12-10 MED ORDER — HEPARIN SOD (PORK) LOCK FLUSH 100 UNIT/ML IV SOLN
500.0000 [IU] | Freq: Once | INTRAVENOUS | Status: AC | PRN
  Administered 2022-12-10: 500 [IU]

## 2022-12-10 MED FILL — Dexamethasone Sodium Phosphate Inj 100 MG/10ML: INTRAMUSCULAR | Qty: 1 | Status: AC

## 2022-12-10 NOTE — Patient Instructions (Signed)
[facility]  Discharge Instructions: Thank you for choosing [facility] to provide your oncology and hematology care.   If you have a lab appointment with the [facility], please go directly to the [facility] and check in at the registration area.   Wear comfortable clothing and clothing appropriate for easy access to any Portacath or PICC line.   We strive to give you quality time with your provider. You may need to reschedule your appointment if you arrive late (15 or more minutes).  Arriving late affects you and other patients whose appointments are after yours.  Also, if you miss three or more appointments without notifying the office, you may be dismissed from the clinic at the provider's discretion.      For prescription refill requests, have your pharmacy contact our office and allow 72 hours for refills to be completed.    Today you received the following chemotherapy and/or immunotherapy agents: Cosela/Etoposide      To help prevent nausea and vomiting after your treatment, we encourage you to take your nausea medication as directed.  BELOW ARE SYMPTOMS THAT SHOULD BE REPORTED IMMEDIATELY: *FEVER GREATER THAN 100.4 F (38 C) OR HIGHER *CHILLS OR SWEATING *NAUSEA AND VOMITING THAT IS NOT CONTROLLED WITH YOUR NAUSEA MEDICATION *UNUSUAL SHORTNESS OF BREATH *UNUSUAL BRUISING OR BLEEDING *URINARY PROBLEMS (pain or burning when urinating, or frequent urination) *BOWEL PROBLEMS (unusual diarrhea, constipation, pain near the anus) TENDERNESS IN MOUTH AND THROAT WITH OR WITHOUT PRESENCE OF ULCERS (sore throat, sores in mouth, or a toothache) UNUSUAL RASH, SWELLING OR PAIN  UNUSUAL VAGINAL DISCHARGE OR ITCHING   Items with * indicate a potential emergency and should be followed up as soon as possible or go to the Emergency Department if any problems should occur.  Please show the CHEMOTHERAPY ALERT CARD or IMMUNOTHERAPY ALERT CARD at  check-in to the Emergency Department and triage nurse.  Should you have questions after your visit or need to cancel or reschedule your appointment, please contact [facility]  Dept: [phone]  and follow the prompts.  Office hours are 8:00 a.m. to 4:30 p.m. Monday - Friday. Please note that voicemails left after 4:00 p.m. may not be returned until the following business day.  We are closed weekends and major holidays. You have access to a nurse at all times for urgent questions. Please call the main number to the clinic Dept: [phone] and follow the prompts.   For any non-urgent questions, you may also contact your provider using MyChart. We now offer e-Visits for anyone 14 and older to request care online for non-urgent symptoms. For details visit [email or web address].   Also download the MyChart app! Go to the app store, search "MyChart", open the app, select [facility], and log in with your MyChart username and password.

## 2022-12-11 ENCOUNTER — Telehealth: Payer: Self-pay | Admitting: Internal Medicine

## 2022-12-11 ENCOUNTER — Inpatient Hospital Stay: Payer: Medicare Other

## 2022-12-11 VITALS — BP 127/81 | HR 78 | Temp 98.3°F | Resp 18

## 2022-12-11 DIAGNOSIS — Z79899 Other long term (current) drug therapy: Secondary | ICD-10-CM | POA: Diagnosis not present

## 2022-12-11 DIAGNOSIS — C3411 Malignant neoplasm of upper lobe, right bronchus or lung: Secondary | ICD-10-CM | POA: Diagnosis not present

## 2022-12-11 DIAGNOSIS — C787 Secondary malignant neoplasm of liver and intrahepatic bile duct: Secondary | ICD-10-CM | POA: Diagnosis not present

## 2022-12-11 DIAGNOSIS — Z7984 Long term (current) use of oral hypoglycemic drugs: Secondary | ICD-10-CM | POA: Diagnosis not present

## 2022-12-11 DIAGNOSIS — Z5111 Encounter for antineoplastic chemotherapy: Secondary | ICD-10-CM | POA: Diagnosis not present

## 2022-12-11 DIAGNOSIS — J90 Pleural effusion, not elsewhere classified: Secondary | ICD-10-CM | POA: Diagnosis not present

## 2022-12-11 MED ORDER — SODIUM CHLORIDE 0.9 % IV SOLN
10.0000 mg | Freq: Once | INTRAVENOUS | Status: AC
  Administered 2022-12-11: 10 mg via INTRAVENOUS
  Filled 2022-12-11: qty 10

## 2022-12-11 MED ORDER — HEPARIN SOD (PORK) LOCK FLUSH 100 UNIT/ML IV SOLN
500.0000 [IU] | Freq: Once | INTRAVENOUS | Status: AC | PRN
  Administered 2022-12-11: 500 [IU]

## 2022-12-11 MED ORDER — TRILACICLIB DIHYDROCHLORIDE INJECTION 300 MG
240.0000 mg/m2 | Freq: Once | INTRAVENOUS | Status: AC
  Administered 2022-12-11: 600 mg via INTRAVENOUS
  Filled 2022-12-11: qty 40

## 2022-12-11 MED ORDER — SODIUM CHLORIDE 0.9% FLUSH
10.0000 mL | INTRAVENOUS | Status: DC | PRN
  Administered 2022-12-11: 10 mL

## 2022-12-11 MED ORDER — SODIUM CHLORIDE 0.9 % IV SOLN
80.0000 mg/m2 | Freq: Once | INTRAVENOUS | Status: AC
  Administered 2022-12-11: 210 mg via INTRAVENOUS
  Filled 2022-12-11: qty 10.5

## 2022-12-11 MED ORDER — SODIUM CHLORIDE 0.9 % IV SOLN: Freq: Once | INTRAVENOUS | Status: AC

## 2022-12-11 NOTE — Patient Instructions (Signed)
[facility]  Discharge Instructions: Thank you for choosing [facility] to provide your oncology and hematology care.   If you have a lab appointment with the [facility], please go directly to the [facility] and check in at the registration area.   Wear comfortable clothing and clothing appropriate for easy access to any Portacath or PICC line.   We strive to give you quality time with your provider. You may need to reschedule your appointment if you arrive late (15 or more minutes).  Arriving late affects you and other patients whose appointments are after yours.  Also, if you miss three or more appointments without notifying the office, you may be dismissed from the clinic at the provider's discretion.      For prescription refill requests, have your pharmacy contact our office and allow 72 hours for refills to be completed.    Today you received the following chemotherapy and/or immunotherapy agents: Cosela/Etoposide      To help prevent nausea and vomiting after your treatment, we encourage you to take your nausea medication as directed.  BELOW ARE SYMPTOMS THAT SHOULD BE REPORTED IMMEDIATELY: *FEVER GREATER THAN 100.4 F (38 C) OR HIGHER *CHILLS OR SWEATING *NAUSEA AND VOMITING THAT IS NOT CONTROLLED WITH YOUR NAUSEA MEDICATION *UNUSUAL SHORTNESS OF BREATH *UNUSUAL BRUISING OR BLEEDING *URINARY PROBLEMS (pain or burning when urinating, or frequent urination) *BOWEL PROBLEMS (unusual diarrhea, constipation, pain near the anus) TENDERNESS IN MOUTH AND THROAT WITH OR WITHOUT PRESENCE OF ULCERS (sore throat, sores in mouth, or a toothache) UNUSUAL RASH, SWELLING OR PAIN  UNUSUAL VAGINAL DISCHARGE OR ITCHING   Items with * indicate a potential emergency and should be followed up as soon as possible or go to the Emergency Department if any problems should occur.  Please show the CHEMOTHERAPY ALERT CARD or IMMUNOTHERAPY ALERT CARD at  check-in to the Emergency Department and triage nurse.  Should you have questions after your visit or need to cancel or reschedule your appointment, please contact [facility]  Dept: [phone]  and follow the prompts.  Office hours are 8:00 a.m. to 4:30 p.m. Monday - Friday. Please note that voicemails left after 4:00 p.m. may not be returned until the following business day.  We are closed weekends and major holidays. You have access to a nurse at all times for urgent questions. Please call the main number to the clinic Dept: [phone] and follow the prompts.   For any non-urgent questions, you may also contact your provider using MyChart. We now offer e-Visits for anyone 39 and older to request care online for non-urgent symptoms. For details visit [email or web address].   Also download the MyChart app! Go to the app store, search "MyChart", open the app, select [facility], and log in with your MyChart username and password.

## 2022-12-11 NOTE — Telephone Encounter (Signed)
Called patient regarding upcoming January appointments, left a voicemail.

## 2022-12-16 ENCOUNTER — Other Ambulatory Visit: Payer: Medicare Other

## 2022-12-16 ENCOUNTER — Other Ambulatory Visit (HOSPITAL_COMMUNITY): Payer: Self-pay | Admitting: Student

## 2022-12-16 DIAGNOSIS — C3411 Malignant neoplasm of upper lobe, right bronchus or lung: Secondary | ICD-10-CM

## 2022-12-17 ENCOUNTER — Ambulatory Visit (HOSPITAL_COMMUNITY)
Admission: RE | Admit: 2022-12-17 | Discharge: 2022-12-17 | Disposition: A | Discharge status: Home / Self Care | Payer: Medicare Other | Source: Ambulatory Visit | Attending: Radiation Oncology | Admitting: Radiation Oncology

## 2022-12-17 ENCOUNTER — Other Ambulatory Visit: Payer: Medicare Other

## 2022-12-17 ENCOUNTER — Inpatient Hospital Stay: Payer: Medicare Other | Attending: Internal Medicine

## 2022-12-17 DIAGNOSIS — C229 Malignant neoplasm of liver, not specified as primary or secondary: Secondary | ICD-10-CM | POA: Diagnosis not present

## 2022-12-17 DIAGNOSIS — C3411 Malignant neoplasm of upper lobe, right bronchus or lung: Secondary | ICD-10-CM | POA: Insufficient documentation

## 2022-12-17 DIAGNOSIS — Z95828 Presence of other vascular implants and grafts: Secondary | ICD-10-CM

## 2022-12-17 DIAGNOSIS — Z510 Encounter for antineoplastic radiation therapy: Secondary | ICD-10-CM | POA: Insufficient documentation

## 2022-12-17 DIAGNOSIS — C787 Secondary malignant neoplasm of liver and intrahepatic bile duct: Secondary | ICD-10-CM | POA: Insufficient documentation

## 2022-12-17 DIAGNOSIS — Z79899 Other long term (current) drug therapy: Secondary | ICD-10-CM | POA: Insufficient documentation

## 2022-12-17 DIAGNOSIS — Z452 Encounter for adjustment and management of vascular access device: Secondary | ICD-10-CM | POA: Insufficient documentation

## 2022-12-17 DIAGNOSIS — Z5111 Encounter for antineoplastic chemotherapy: Secondary | ICD-10-CM | POA: Insufficient documentation

## 2022-12-17 LAB — CMP (CANCER CENTER ONLY)
ALT: 18 U/L (ref 0–44)
AST: 16 U/L (ref 15–41)
Albumin: 3.9 g/dL (ref 3.5–5.0)
Alkaline Phosphatase: 60 U/L (ref 38–126)
Anion gap: 8 (ref 5–15)
BUN: 23 mg/dL (ref 8–23)
CO2: 29 mmol/L (ref 22–32)
Calcium: 8.7 mg/dL — ABNORMAL LOW (ref 8.9–10.3)
Chloride: 102 mmol/L (ref 98–111)
Creatinine: 0.99 mg/dL (ref 0.61–1.24)
GFR, Estimated: >60 mL/min (ref >60)
Glucose, Bld: 173 mg/dL — ABNORMAL HIGH (ref 70–99)
Potassium: 4.2 mmol/L (ref 3.5–5.1)
Sodium: 139 mmol/L (ref 135–145)
Total Bilirubin: 0.4 mg/dL (ref 0.3–1.2)
Total Protein: 6 g/dL — ABNORMAL LOW (ref 6.5–8.1)

## 2022-12-17 LAB — CBC WITH DIFFERENTIAL (CANCER CENTER ONLY)
Abs Immature Granulocytes: 0.02 10*3/uL (ref 0.00–0.07)
Basophils Absolute: 0 10*3/uL (ref 0.0–0.1)
Basophils Relative: 1 %
Eosinophils Absolute: 0 10*3/uL (ref 0.0–0.5)
Eosinophils Relative: 1 %
HCT: 29.7 % — ABNORMAL LOW (ref 39.0–52.0)
Hemoglobin: 9.7 g/dL — ABNORMAL LOW (ref 13.0–17.0)
Immature Granulocytes: 1 %
Lymphocytes Relative: 14 %
Lymphs Abs: 0.4 10*3/uL — ABNORMAL LOW (ref 0.7–4.0)
MCH: 30.9 pg (ref 26.0–34.0)
MCHC: 32.7 g/dL (ref 30.0–36.0)
MCV: 94.6 fL (ref 80.0–100.0)
Monocytes Absolute: 0.2 10*3/uL (ref 0.1–1.0)
Monocytes Relative: 6 %
Neutro Abs: 2.3 10*3/uL (ref 1.7–7.7)
Neutrophils Relative %: 77 %
Platelet Count: 98 10*3/uL — ABNORMAL LOW (ref 150–400)
RBC: 3.14 MIL/uL — ABNORMAL LOW (ref 4.22–5.81)
RDW: 15.4 % (ref 11.5–15.5)
WBC Count: 3 10*3/uL — ABNORMAL LOW (ref 4.0–10.5)
nRBC: 0 % (ref 0.0–0.2)

## 2022-12-17 LAB — SAMPLE TO BLOOD BANK

## 2022-12-17 MED ORDER — SODIUM CHLORIDE 0.9% FLUSH
10.0000 mL | Freq: Once | INTRAVENOUS | Status: AC
  Administered 2022-12-17: 10 mL

## 2022-12-17 MED ORDER — SODIUM CHLORIDE 0.9 % IV SOLN: INTRAVENOUS | Status: DC

## 2022-12-17 MED ORDER — HEPARIN SOD (PORK) LOCK FLUSH 100 UNIT/ML IV SOLN
500.0000 [IU] | Freq: Once | INTRAVENOUS | Status: AC
  Administered 2022-12-17: 500 [IU]

## 2022-12-17 NOTE — H&P (Signed)
   Pt presented to [facility] IR dept today for fiducial marker placements into liver prior to SBRT. Upon questioning pt today he stated that he took his eliquis this morning. Eliquis needs to be held for 48 hours before procedure. Notified rad onc team of above . The respective scheduling teams from rad onc and IR will coordinate rescheduling of pt's procedure. Pt updated.

## 2022-12-19 ENCOUNTER — Ambulatory Visit: Payer: Medicare Other

## 2022-12-19 ENCOUNTER — Ambulatory Visit: Payer: Medicare Other | Admitting: Radiation Oncology

## 2022-12-22 DIAGNOSIS — C3411 Malignant neoplasm of upper lobe, right bronchus or lung: Secondary | ICD-10-CM | POA: Diagnosis not present

## 2022-12-23 ENCOUNTER — Inpatient Hospital Stay: Payer: Medicare Other

## 2022-12-23 ENCOUNTER — Other Ambulatory Visit: Payer: Medicare Other

## 2022-12-23 DIAGNOSIS — C3411 Malignant neoplasm of upper lobe, right bronchus or lung: Secondary | ICD-10-CM | POA: Diagnosis not present

## 2022-12-23 DIAGNOSIS — Z452 Encounter for adjustment and management of vascular access device: Secondary | ICD-10-CM | POA: Diagnosis not present

## 2022-12-23 DIAGNOSIS — Z510 Encounter for antineoplastic radiation therapy: Secondary | ICD-10-CM | POA: Diagnosis not present

## 2022-12-23 DIAGNOSIS — Z5111 Encounter for antineoplastic chemotherapy: Secondary | ICD-10-CM | POA: Diagnosis not present

## 2022-12-23 DIAGNOSIS — Z79899 Other long term (current) drug therapy: Secondary | ICD-10-CM | POA: Diagnosis not present

## 2022-12-23 DIAGNOSIS — D649 Anemia, unspecified: Secondary | ICD-10-CM

## 2022-12-23 DIAGNOSIS — C787 Secondary malignant neoplasm of liver and intrahepatic bile duct: Secondary | ICD-10-CM | POA: Diagnosis not present

## 2022-12-23 DIAGNOSIS — Z95828 Presence of other vascular implants and grafts: Secondary | ICD-10-CM

## 2022-12-23 LAB — CBC WITH DIFFERENTIAL (CANCER CENTER ONLY)
Abs Immature Granulocytes: 0.01 10*3/uL (ref 0.00–0.07)
Basophils Absolute: 0 10*3/uL (ref 0.0–0.1)
Basophils Relative: 1 %
Eosinophils Absolute: 0 10*3/uL (ref 0.0–0.5)
Eosinophils Relative: 1 %
HCT: 27.4 % — ABNORMAL LOW (ref 39.0–52.0)
Hemoglobin: 8.9 g/dL — ABNORMAL LOW (ref 13.0–17.0)
Immature Granulocytes: 0 %
Lymphocytes Relative: 22 %
Lymphs Abs: 0.5 10*3/uL — ABNORMAL LOW (ref 0.7–4.0)
MCH: 31.6 pg (ref 26.0–34.0)
MCHC: 32.5 g/dL (ref 30.0–36.0)
MCV: 97.2 fL (ref 80.0–100.0)
Monocytes Absolute: 0.4 10*3/uL (ref 0.1–1.0)
Monocytes Relative: 18 %
Neutro Abs: 1.3 10*3/uL — ABNORMAL LOW (ref 1.7–7.7)
Neutrophils Relative %: 58 %
Platelet Count: 59 10*3/uL — ABNORMAL LOW (ref 150–400)
RBC: 2.82 MIL/uL — ABNORMAL LOW (ref 4.22–5.81)
RDW: 16.2 % — ABNORMAL HIGH (ref 11.5–15.5)
WBC Count: 2.3 10*3/uL — ABNORMAL LOW (ref 4.0–10.5)
nRBC: 0 % (ref 0.0–0.2)

## 2022-12-23 LAB — CMP (CANCER CENTER ONLY)
ALT: 18 U/L (ref 0–44)
AST: 19 U/L (ref 15–41)
Albumin: 3.9 g/dL (ref 3.5–5.0)
Alkaline Phosphatase: 69 U/L (ref 38–126)
Anion gap: 7 (ref 5–15)
BUN: 27 mg/dL — ABNORMAL HIGH (ref 8–23)
CO2: 31 mmol/L (ref 22–32)
Calcium: 8.3 mg/dL — ABNORMAL LOW (ref 8.9–10.3)
Chloride: 103 mmol/L (ref 98–111)
Creatinine: 1.15 mg/dL (ref 0.61–1.24)
GFR, Estimated: >60 mL/min (ref >60)
Glucose, Bld: 148 mg/dL — ABNORMAL HIGH (ref 70–99)
Potassium: 4.2 mmol/L (ref 3.5–5.1)
Sodium: 141 mmol/L (ref 135–145)
Total Bilirubin: 0.4 mg/dL (ref 0.3–1.2)
Total Protein: 6.2 g/dL — ABNORMAL LOW (ref 6.5–8.1)

## 2022-12-23 LAB — SAMPLE TO BLOOD BANK

## 2022-12-23 MED ORDER — SODIUM CHLORIDE 0.9% FLUSH
10.0000 mL | Freq: Once | INTRAVENOUS | Status: AC
  Administered 2022-12-23: 10 mL

## 2022-12-23 MED ORDER — HEPARIN SOD (PORK) LOCK FLUSH 100 UNIT/ML IV SOLN
500.0000 [IU] | Freq: Once | INTRAVENOUS | Status: AC
  Administered 2022-12-23: 500 [IU]

## 2022-12-30 ENCOUNTER — Other Ambulatory Visit: Payer: Self-pay

## 2022-12-30 ENCOUNTER — Ambulatory Visit: Payer: Medicare Other

## 2022-12-30 ENCOUNTER — Inpatient Hospital Stay: Payer: Medicare Other

## 2022-12-30 ENCOUNTER — Ambulatory Visit: Payer: Medicare Other | Admitting: Internal Medicine

## 2022-12-30 DIAGNOSIS — C3411 Malignant neoplasm of upper lobe, right bronchus or lung: Secondary | ICD-10-CM | POA: Diagnosis not present

## 2022-12-30 DIAGNOSIS — Z79899 Other long term (current) drug therapy: Secondary | ICD-10-CM | POA: Diagnosis not present

## 2022-12-30 DIAGNOSIS — Z510 Encounter for antineoplastic radiation therapy: Secondary | ICD-10-CM | POA: Diagnosis not present

## 2022-12-30 DIAGNOSIS — Z5111 Encounter for antineoplastic chemotherapy: Secondary | ICD-10-CM | POA: Diagnosis not present

## 2022-12-30 DIAGNOSIS — Z95828 Presence of other vascular implants and grafts: Secondary | ICD-10-CM

## 2022-12-30 DIAGNOSIS — C787 Secondary malignant neoplasm of liver and intrahepatic bile duct: Secondary | ICD-10-CM | POA: Diagnosis not present

## 2022-12-30 DIAGNOSIS — Z452 Encounter for adjustment and management of vascular access device: Secondary | ICD-10-CM | POA: Diagnosis not present

## 2022-12-30 DIAGNOSIS — D649 Anemia, unspecified: Secondary | ICD-10-CM

## 2022-12-30 LAB — CBC WITH DIFFERENTIAL (CANCER CENTER ONLY)
Abs Immature Granulocytes: 0.03 10*3/uL (ref 0.00–0.07)
Basophils Absolute: 0 10*3/uL (ref 0.0–0.1)
Basophils Relative: 1 %
Eosinophils Absolute: 0.1 10*3/uL (ref 0.0–0.5)
Eosinophils Relative: 2 %
HCT: 29.2 % — ABNORMAL LOW (ref 39.0–52.0)
Hemoglobin: 9.9 g/dL — ABNORMAL LOW (ref 13.0–17.0)
Immature Granulocytes: 1 %
Lymphocytes Relative: 18 %
Lymphs Abs: 0.6 10*3/uL — ABNORMAL LOW (ref 0.7–4.0)
MCH: 32.1 pg (ref 26.0–34.0)
MCHC: 33.9 g/dL (ref 30.0–36.0)
MCV: 94.8 fL (ref 80.0–100.0)
Monocytes Absolute: 0.6 10*3/uL (ref 0.1–1.0)
Monocytes Relative: 18 %
Neutro Abs: 2 10*3/uL (ref 1.7–7.7)
Neutrophils Relative %: 60 %
Platelet Count: 165 10*3/uL (ref 150–400)
RBC: 3.08 MIL/uL — ABNORMAL LOW (ref 4.22–5.81)
RDW: 16.6 % — ABNORMAL HIGH (ref 11.5–15.5)
WBC Count: 3.2 10*3/uL — ABNORMAL LOW (ref 4.0–10.5)
nRBC: 0 % (ref 0.0–0.2)

## 2022-12-30 LAB — CMP (CANCER CENTER ONLY)
ALT: 17 U/L (ref 0–44)
AST: 18 U/L (ref 15–41)
Albumin: 3.9 g/dL (ref 3.5–5.0)
Alkaline Phosphatase: 65 U/L (ref 38–126)
Anion gap: 10 (ref 5–15)
BUN: 17 mg/dL (ref 8–23)
CO2: 27 mmol/L (ref 22–32)
Calcium: 9.3 mg/dL (ref 8.9–10.3)
Chloride: 103 mmol/L (ref 98–111)
Creatinine: 1.01 mg/dL (ref 0.61–1.24)
GFR, Estimated: >60 mL/min (ref >60)
Glucose, Bld: 173 mg/dL — ABNORMAL HIGH (ref 70–99)
Potassium: 3.8 mmol/L (ref 3.5–5.1)
Sodium: 140 mmol/L (ref 135–145)
Total Bilirubin: 0.3 mg/dL (ref 0.3–1.2)
Total Protein: 6.4 g/dL — ABNORMAL LOW (ref 6.5–8.1)

## 2022-12-30 LAB — SAMPLE TO BLOOD BANK

## 2022-12-30 MED ORDER — SODIUM CHLORIDE 0.9% FLUSH
10.0000 mL | Freq: Once | INTRAVENOUS | Status: AC
  Administered 2022-12-30: 10 mL

## 2022-12-30 MED ORDER — HEPARIN SOD (PORK) LOCK FLUSH 100 UNIT/ML IV SOLN
500.0000 [IU] | Freq: Once | INTRAVENOUS | Status: AC
  Administered 2022-12-30: 500 [IU]

## 2022-12-31 ENCOUNTER — Ambulatory Visit: Payer: Medicare Other

## 2022-12-31 ENCOUNTER — Other Ambulatory Visit: Payer: Self-pay | Admitting: Radiology

## 2022-12-31 DIAGNOSIS — C3490 Malignant neoplasm of unspecified part of unspecified bronchus or lung: Secondary | ICD-10-CM

## 2023-01-01 ENCOUNTER — Ambulatory Visit: Payer: Medicare Other

## 2023-01-01 ENCOUNTER — Encounter (HOSPITAL_COMMUNITY): Payer: Self-pay

## 2023-01-01 ENCOUNTER — Ambulatory Visit (HOSPITAL_COMMUNITY)
Admission: RE | Admit: 2023-01-01 | Discharge: 2023-01-01 | Disposition: A | Discharge status: Home / Self Care | Payer: Medicare Other | Source: Ambulatory Visit | Attending: Radiation Oncology | Admitting: Radiation Oncology

## 2023-01-01 ENCOUNTER — Ambulatory Visit (HOSPITAL_COMMUNITY)
Admission: RE | Admit: 2023-01-01 | Discharge: 2023-01-01 | Disposition: A | Discharge status: Home / Self Care | Payer: Medicare Other | Source: Ambulatory Visit

## 2023-01-01 DIAGNOSIS — Z79899 Other long term (current) drug therapy: Secondary | ICD-10-CM | POA: Insufficient documentation

## 2023-01-01 DIAGNOSIS — C229 Malignant neoplasm of liver, not specified as primary or secondary: Secondary | ICD-10-CM | POA: Diagnosis not present

## 2023-01-01 DIAGNOSIS — J449 Chronic obstructive pulmonary disease, unspecified: Secondary | ICD-10-CM | POA: Insufficient documentation

## 2023-01-01 DIAGNOSIS — K7689 Other specified diseases of liver: Secondary | ICD-10-CM | POA: Diagnosis not present

## 2023-01-01 DIAGNOSIS — M549 Dorsalgia, unspecified: Secondary | ICD-10-CM | POA: Insufficient documentation

## 2023-01-01 DIAGNOSIS — C3490 Malignant neoplasm of unspecified part of unspecified bronchus or lung: Secondary | ICD-10-CM | POA: Diagnosis not present

## 2023-01-01 DIAGNOSIS — K769 Liver disease, unspecified: Secondary | ICD-10-CM | POA: Diagnosis not present

## 2023-01-01 DIAGNOSIS — Z7901 Long term (current) use of anticoagulants: Secondary | ICD-10-CM | POA: Diagnosis not present

## 2023-01-01 DIAGNOSIS — I10 Essential (primary) hypertension: Secondary | ICD-10-CM | POA: Insufficient documentation

## 2023-01-01 DIAGNOSIS — K219 Gastro-esophageal reflux disease without esophagitis: Secondary | ICD-10-CM | POA: Insufficient documentation

## 2023-01-01 LAB — PROTIME-INR
INR: 1 (ref 0.8–1.2)
Prothrombin Time: 12.7 seconds (ref 11.4–15.2)

## 2023-01-01 LAB — GLUCOSE, CAPILLARY: Glucose-Capillary: 92 mg/dL (ref 70–99)

## 2023-01-01 MED ORDER — MIDAZOLAM HCL 2 MG/2ML IJ SOLN
INTRAMUSCULAR | Status: AC
  Filled 2023-01-01: qty 2

## 2023-01-01 MED ORDER — SODIUM CHLORIDE 0.9 % IV SOLN: INTRAVENOUS | Status: DC

## 2023-01-01 MED ORDER — FENTANYL CITRATE (PF) 100 MCG/2ML IJ SOLN
INTRAMUSCULAR | Status: AC | PRN
  Administered 2023-01-01 (×4): 50 ug via INTRAVENOUS

## 2023-01-01 MED ORDER — LIDOCAINE HCL (PF) 1 % IJ SOLN
INTRAMUSCULAR | Status: AC | PRN
  Administered 2023-01-01: 10 mL

## 2023-01-01 MED ORDER — LIDOCAINE HCL 1 % IJ SOLN
INTRAMUSCULAR | Status: AC
  Administered 2023-01-01: 10 mL
  Filled 2023-01-01: qty 20

## 2023-01-01 MED ORDER — MIDAZOLAM HCL 2 MG/2ML IJ SOLN
INTRAMUSCULAR | Status: AC | PRN
  Administered 2023-01-01 (×4): 1 mg via INTRAVENOUS

## 2023-01-01 MED ORDER — HEPARIN SOD (PORK) LOCK FLUSH 100 UNIT/ML IV SOLN
500.0000 [IU] | Freq: Once | INTRAVENOUS | Status: AC
  Administered 2023-01-01: 500 [IU] via INTRAVENOUS
  Filled 2023-01-01: qty 5

## 2023-01-01 MED ORDER — HYDROCODONE-ACETAMINOPHEN 5-325 MG PO TABS: 1.0000 | ORAL_TABLET | ORAL | Status: DC | PRN

## 2023-01-01 MED ORDER — FENTANYL CITRATE (PF) 100 MCG/2ML IJ SOLN
INTRAMUSCULAR | Status: AC
  Filled 2023-01-01: qty 2

## 2023-01-01 NOTE — Discharge Instructions (Signed)
Liver Biopsy, Care After  May remove dressing or bandaid and shower tomorrow.  Keep site clean and dry. Replace with clean dressing or bandaid as necessary.   Urgent needs - Interventional Radiology clinic [phone]  After a liver biopsy, it is common to have these things in the area where the biopsy was done. You may: Have pain. Feel sore. Have bruising. You may also feel tired for a few days. Follow these instructions at home: Medicines Take over-the-counter and prescription medicines only as told by your doctor. If you were prescribed an antibiotic medicine, take it as told by your doctor. Do not stop taking the antibiotic, even if you start to feel better. Do not take medicines that may thin your blood. These medicines include aspirin and ibuprofen. Take them only if your doctor tells you to. If told, take steps to prevent problems with pooping (constipation). You may need to: Drink enough fluid to keep your pee (urine) pale yellow. Take medicines. You will be told what medicines to take. Eat foods that are high in fiber. These include beans, whole grains, and fresh fruits and vegetables. Limit foods that are high in fat and sugar. These include fried or sweet foods. Ask your doctor if you should avoid driving or using machines while you are taking your medicine. Caring for your incision Follow instructions from your doctor about how to take care of your cut from surgery (incisions). Make sure you: Wash your hands with soap and water for at least 20 seconds before and after you change your bandage. If you cannot use soap and water, use hand sanitizer. Change your bandage. Leavestitches or skin glue in place for at least two weeks. Leave tape strips alone unless you are told to take them off. You may trim the edges of the tape strips if they curl up. Check your incision every day for signs of infection. Check for: Redness, swelling, or more pain. Fluid or blood. Warmth. Pus or a  bad smell. Do not take baths, swim, or use a hot tub. Ask your doctor about taking showers or sponge baths. Activity Rest at home for 1-2 days, or as told by your doctor. Get up to take short walks every 1 to 2 hours. Ask for help if you feel weak or unsteady. Do not lift anything that is heavier than 10 lb (4.5 kg), or the limit that you are told. Do not play contact sports for 2 weeks after the procedure. Return to your normal activities as told by your doctor. Ask what activities are safe for you. General instructions Do not drink alcohol in the first week after the procedure. Plan to have a responsible adult care for you for the time you are told after you leave the hospital or clinic. This is important. It is up to you to get the results of your procedure. Ask how to get your results when they are ready. Keep all follow-up visits.   Contact a doctor if: You have more bleeding in your incision. Your incision swells, or is red and more painful. You have fluid that comes from your incision. You develop a rash. You have fever or chills. Get help right away if: You have swelling, bloating, or pain in your belly (abdomen). You get dizzy or faint. You vomit or you feel like vomiting. You have trouble breathing or feel short of breath. You have chest pain. You have problems talking or seeing. You have trouble with your balance or moving your arms or   legs. These symptoms may be an emergency. Get help right away. Call your local emergency services (911 in the [place] Do not wait to see if the symptoms will go away. Do not drive yourself to the hospital. Summary After the procedure, it is common to have pain, soreness, bruising, and tiredness. Your doctor will tell you how to take care of yourself at home. Change your bandage, take your medicines, and limit your activities as told by your doctor. Call your doctor if you have symptoms of infection. Get help right away if your belly swells,  your cut bleeds a lot, or you have trouble talking or breathing. This information is not intended to replace advice given to you by your health care provider. Make sure you discuss any questions you have with your healthcare provider. Document Revised: 10/15/2020 Document Reviewed: 10/15/2020 Elsevier Patient Education  2022 Elsevier Inc.  Moderate Conscious Sedation, Adult, Care After This sheet gives you information about how to care for yourself after your procedure. Your health care provider may also give you more specific instructions. If you have problems or questions, contact your health careprovider. What can I expect after the procedure? After the procedure, it is common to have: Sleepiness for several hours. Impaired judgment for several hours. Difficulty with balance. Vomiting if you eat too soon. Follow these instructions at home: For the time period you were told by your health care provider: Rest. Do not participate in activities where you could fall or become injured. Do not drive or use machinery. Do not drink alcohol. Do not take sleeping pills or medicines that cause drowsiness. Do not make important decisions or sign legal documents. Do not take care of children on your own. Eating and drinking  Follow the diet recommended by your health care provider. Drink enough fluid to keep your urine pale yellow. If you vomit: Drink water, juice, or soup when you can drink without vomiting. Make sure you have little or no nausea before eating solid foods.  General instructions Take over-the-counter and prescription medicines only as told by your health care provider. Have a responsible adult stay with you for the time you are told. It is important to have someone help care for you until you are awake and alert. Do not smoke. Keep all follow-up visits as told by your health care provider. This is important. Contact a health care provider if: You are still sleepy or having  trouble with balance after 24 hours. You feel light-headed. You keep feeling nauseous or you keep vomiting. You develop a rash. You have a fever. You have redness or swelling around the IV site. Get help right away if: You have trouble breathing. You have new-onset confusion at home. Summary After the procedure, it is common to feel sleepy, have impaired judgment, or feel nauseous if you eat too soon. Rest after you get home. Know the things you should not do after the procedure. Follow the diet recommended by your health care provider and drink enough fluid to keep your urine pale yellow. Get help right away if you have trouble breathing or new-onset confusion at home. This information is not intended to replace advice given to you by your health care provider. Make sure you discuss any questions you have with your healthcare provider. Document Revised: 03/30/2020 Document Reviewed: 10/27/2019 Elsevier Patient Education  2022 Elsevier Inc.        

## 2023-01-01 NOTE — Procedures (Signed)
Interventional Radiology Procedure:   Indications: Liver lesion and needs fiducial markers for SBRT  Procedure: Placement of fiducial markers with US  Findings: Placed total of 2 Visicoil markers.  1 along inferior margin of lesion and second along the superior aspect.    Complications: No immediate complications noted.     EBL: Minimal  Plan: Bedrest 3 hours, then discharge to home   Adam Wise R. Lowella Dandy, MD  Pager: [phone]

## 2023-01-01 NOTE — Sedation Documentation (Addendum)
Fiducial #1 deployed

## 2023-01-01 NOTE — Sedation Documentation (Addendum)
Fiducial #2 deployed

## 2023-01-01 NOTE — H&P (Signed)
Chief Complaint: Patient was seen in consultation today for liver lesion  Referring Physician(s): Ronny Bacon  Supervising Physician: Richarda Overlie  Patient Status: [facility] - Out-pt  History of Present Illness: Adam Wise is a 72 y.o. 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  IR consulted for fiducial marker placement prior to radiation.    Adam Wise presents in his usual state of health today. He has been NPO.  He has held his Eliquis as directed.  He complains of back pain which has been present for several weeks.  He does believe he would be able to tolerate procedure supine and is agreeable to proceed.   Past Medical History:  Diagnosis Date   Chickenpox    Chronic knee pain    Chronic low back pain    COPD exacerbation (HCC) 10/11/2021   COPD with exacerbation (HCC) 10/12/2021   Coronary artery calcification seen on CAT scan 11/2021   Coronary CTA 11/27/2021: Moderate to large right pleural effusion and compressive atelectasis right lung base. => Coronary Calcium Score 657.  Diffuse RCA calcification.  Minimal mild disease in the LAD and diagonal branches. == Overall limited study.  Notable artifact.   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 He has disease progression after the first line of chemotherapy in December 2021.   Testosterone deficiency    Type 2 diabetes mellitus (HCC)     Past Surgical History:  Procedure Laterality Date   CHEST TUBE INSERTION Right 01/20/2022    Procedure: CHEST TUBE INSERTION;  Surgeon: Lorin Glass, MD;  Location: [facility] ENDOSCOPY;  Service: Pulmonary;  Laterality: Right;  w/ Talc Pleurodesis, planned admit for Obs afterwards   CHEST TUBE INSERTION Right 04/28/2022   Procedure: INSERTION PLEURAL DRAINAGE CATHETER - Pigtail tail, drainage;  Surgeon: Lorin Glass, MD;  Location: [facility] ENDOSCOPY;  Service: Pulmonary;  Laterality: Right;  talc pleurodesis   CHEST TUBE INSERTION Right 09/01/2022   Procedure: INSERTION PLEURAL DRAINAGE CATHETER;  Surgeon: Lupita Leash, MD;  Location: [facility] ENDOSCOPY;  Service: Cardiopulmonary;  Laterality: Right;  pigtail catheter placement with talc pleurodesis   COLONOSCOPY WITH PROPOFOL N/A 12/17/2018   Procedure: COLONOSCOPY WITH PROPOFOL;  Surgeon: Pasty Spillers, MD;  Location: [facility] ENDOSCOPY;  Service: Gastroenterology;  Laterality: N/A;   IR IMAGING GUIDED PORT INSERTION  04/17/2020   IR THORACENTESIS ASP PLEURAL SPACE W/IMG GUIDE  11/29/2021   IR THORACENTESIS ASP PLEURAL SPACE W/IMG GUIDE  01/03/2022   JOINT REPLACEMENT Bilateral    REPLACEMENT TOTAL KNEE BILATERAL  2015   TALC PLEURODESIS  09/01/2022   Procedure: Lurlean Nanny;  Surgeon: Lupita Leash, MD;  Location: [facility] ENDOSCOPY;  Service: Cardiopulmonary;;   TONSILLECTOMY  1960   TRANSTHORACIC ECHOCARDIOGRAM  05/2020   a) 05/2020: EF 55 to 60%.  No R WMA.  Mild LVH.  Indeterminate LVEDP.  Unable to assess RVP.  Normal aortic and mitral valves.  Mildly elevated RAP.; b) 09/2021: EF 50-55%. No RWMA. Mild LVH. ~ LVEDP. Mild LA Dilation. NORMAL RV/RAP.  Normal  MV/AoV.    Allergies: Bupropion and Hydrochlorothiazide  Medications: Prior to Admission medications   Medication Sig Start Date End Date Taking? Authorizing Provider  amLODipine (NORVASC) 10 MG tablet TAKE 1 TABLET BY MOUTH EVERY DAY FOR BLOOD PRESSURE 12/01/22  Yes Doreene Nest, NP  bisoprolol (ZEBETA) 5 MG tablet TAKE 1 TABLET (5 MG TOTAL) BY MOUTH DAILY. 05/09/22  Yes  Marykay Lex, MD  Black Pepper-Turmeric (TURMERIC COMPLEX/BLACK PEPPER PO) Take 1 tablet by mouth in the morning and at bedtime.   Yes [provider]  Budeson-Glycopyrrol-Formoterol (BREZTRI AEROSPHERE) 160-9-4.8 MCG/ACT AERO Inhale 2 puffs into the lungs in the morning and at bedtime. 07/23/22  Yes Icard, Rachel Bo, DO  chlorpheniramine-HYDROcodone (TUSSIONEX) 10-8 MG/5ML Take 5 mLs by mouth every 12 (twelve) hours as needed for cough. 10/14/22  Yes Si Gaul, MD  Coenzyme Q10 (COQ10) 200 MG CAPS Take 200 mg by mouth at bedtime.   Yes [provider]  esomeprazole (NEXIUM) 20 MG capsule Take 20 mg by mouth in the morning.   Yes [provider]  gabapentin (NEURONTIN) 300 MG capsule TAKE 1 CAPSULE (300 MG TOTAL) BY MOUTH 2 (TWO) TIMES DAILY. FOR BACK PAIN. 09/07/22  Yes Doreene Nest, NP  glipiZIDE (GLUCOTROL) 10 MG tablet TAKE 1 TABLET (10 MG TOTAL) BY MOUTH 2 (TWO) TIMES DAILY BEFORE A MEAL. FOR DIABETES. 11/28/22  Yes Doreene Nest, NP  Insulin Glargine [facility]) 100 UNIT/ML Inject 24 Units into the skin at bedtime. For diabetes. 06/24/22  Yes Doreene Nest, NP  KRILL OIL PO Take 1 capsule by mouth daily.   Yes [provider]  losartan (COZAAR) 50 MG tablet TAKE 1 TABLET (50 MG TOTAL) BY MOUTH DAILY. FOR BLOOD PRESSURE. 09/02/22  Yes Doreene Nest, NP  Melatonin 10 MG CAPS Take 10 mg by mouth at bedtime.   Yes [provider]  metFORMIN (GLUCOPHAGE) 1000 MG tablet TAKE 1 TABLET (1,000 MG TOTAL) BY MOUTH 2 (TWO) TIMES DAILY WITH A MEAL. FOR DIABETES 12/04/22  Yes Doreene Nest, NP  Multiple Vitamin (MULTI-VITAMINS) TABS Take 1 tablet by mouth in the morning.   Yes [provider]  oxymetazoline (AFRIN) 0.05 % nasal spray Place 1 spray into both nostrils daily as needed for congestion.   Yes [provider]  pioglitazone (ACTOS) 45 MG tablet TAKE 1 TABLET (45 MG TOTAL) BY MOUTH DAILY. FOR  DIABETES. 12/04/22  Yes Doreene Nest, NP  pravastatin (PRAVACHOL) 40 MG tablet TAKE 1 TABLET BY MOUTH EVERY DAY IN THE EVENING FOR CHOLESTEROL 09/07/22  Yes Doreene Nest, NP  traMADol (ULTRAM) 50 MG tablet Take 1 tablet (50 mg total) by mouth every 6 (six) hours as needed. 09/10/22  Yes Heilingoetter, Cassandra L, PA-C  albuterol (VENTOLIN HFA) 108 (90 Base) MCG/ACT inhaler Inhale 2 puffs into the lungs every 6 (six) hours as needed for wheezing or shortness of breath. 03/14/20   Arvilla Market, MD  B-D 3CC LUER-LOK SYR 22GX1" 22G X 1" 3 ML MISC USE AS INSTRUCTED FOR TESTOSTERONE INJECTION EVERY 2 WEEKS 03/20/21   Doreene Nest, NP  blood glucose meter kit and supplies KIT Dispense based on patient and insurance preference. Use up to four times daily as directed. 04/09/18   Doreene Nest, NP  ELIQUIS 5 MG TABS tablet TAKE 1 TABLET BY MOUTH TWICE A DAY 09/02/22   Heilingoetter, Cassandra L, PA-C  glucose blood (ONETOUCH ULTRA) test strip USE UP TO 4  TIMES DAILY AS DIRECTED 07/29/22   Doreene Nest, NP  Insulin Pen Needle (BD PEN NEEDLE NANO U/F) 32G X 4 MM MISC Use with insulin as prescribed Dx Code: E11.9 05/15/22   Doreene Nest, NP  ipratropium-albuterol (DUONEB) 0.5-2.5 (3) MG/3ML SOLN Take 3 mLs by nebulization every 4 (four) hours while awake for 3 days, THEN 3 mLs every 4 (four) hours as needed (shortness of breath or wheezing). 10/13/21 01/18/24  Narda Bonds, MD  Lancets MISC USE UP TO 3 TIMES DAILY AS DIRECTED 05/06/18   Doreene Nest, NP  meclizine (ANTIVERT) 12.5 MG tablet Take 1 tablet (12.5 mg total) by mouth 3 (three) times daily as needed for dizziness. Patient not taking: Reported on 11/12/2022 10/28/22   Heilingoetter, Cassandra L, PA-C  testosterone cypionate (DEPOTESTOSTERONE CYPIONATE) 200 MG/ML injection Inject 200 mg into the muscle every 14 (fourteen) days.    [provider]     Family History  Problem Relation Age of Onset    Heart attack Mother    Cancer Mother    Hypertension Mother    Arthritis Father    Asthma Father    Cancer Father    COPD Father    Heart attack Father    Hypertension Sister    Cancer Sister    Diabetes Sister    Asthma Sister    Cancer Sister    COPD Sister    Arthritis Sister    Asthma Sister    Diabetes Sister    Birth defects Maternal Grandfather    Arthritis Paternal Grandmother    Diabetes Paternal Grandmother    Arthritis Paternal Grandfather    Asthma Son    Other Neg Hx        pituitary disorder    Social History   Socioeconomic History   Marital status: Married    Spouse name: Not on file   Number of children: Not on file   Years of education: Not on file   Highest education level: Not on file  Occupational History   Not on file  Tobacco Use   Smoking status: Former   Smokeless tobacco: Never  Vaping Use   Vaping Use: Never used  Substance and Sexual Activity   Alcohol use: Not Currently    Comment: rarely   Drug use: Not Currently   Sexual activity: Yes  Other Topics Concern   Not on file  Social History Narrative   Married.   Moved from [place].   Retired.   Social Determinants of Health   Financial Resource Strain: Low Risk  (02/14/2022)   Overall Financial Resource Strain (CARDIA)    Difficulty of Paying Living Expenses: Not hard at all  Food Insecurity: No Food Insecurity (02/14/2022)   Hunger Vital Sign    Worried About Running Out of Food in the Last Year: Never true    Ran Out of Food in the Last Year: Never true  Transportation Needs: No Transportation Needs (02/14/2022)   PRAPARE - Administrator, Civil Service (Medical): No    Lack of Transportation (Non-Medical): No  Physical Activity: Inactive (02/14/2022)   Exercise Vital Sign    Days of Exercise per Week: 0 days    Minutes of Exercise per Session: 0 min  Stress: No Stress Concern Present (02/14/2022)   [employer] of Occupational Health - Occupational Stress  Questionnaire    Feeling of Stress : Only a little  Social Connections: Moderately Integrated (02/14/2022)   Social  Connection and Isolation Panel [NHANES]    Frequency of Communication with Friends and Family: Twice a week    Frequency of Social Gatherings with Friends and Family: Twice a week    Attends Religious Services: 1 to 4 times per year    Active Member of [employer] or Organizations: No    Attends Banker Meetings: Never    Marital Status: Married     Review of Systems: A 12 point ROS discussed and pertinent positives are indicated in the HPI above.  All other systems are negative.  Review of Systems  Constitutional:  Negative for fatigue and fever.  Respiratory:  Negative for cough and shortness of breath.   Cardiovascular:  Negative for chest pain.  Gastrointestinal:  Negative for abdominal pain and nausea.  Musculoskeletal:  Negative for back pain.  Psychiatric/Behavioral:  Negative for behavioral problems and confusion.     Vital Signs: BP 119/77   Pulse 82   Temp 98.1 F (36.7 C) (Oral)   Resp 18   SpO2 98%   Physical Exam Vitals and nursing note reviewed.  Constitutional:      General: He is not in acute distress.    Appearance: Normal appearance. He is not ill-appearing.  HENT:     Mouth/Throat:     Mouth: Mucous membranes are moist.     Pharynx: Oropharynx is clear.  Cardiovascular:     Rate and Rhythm: Normal rate and regular rhythm.     Pulses: Normal pulses.     Heart sounds: Normal heart sounds.  Pulmonary:     Effort: Pulmonary effort is normal. No respiratory distress.     Breath sounds: Normal breath sounds.  Abdominal:     General: Abdomen is flat. There is no distension.     Palpations: Abdomen is soft.  Skin:    General: Skin is warm and dry.  Neurological:     General: No focal deficit present.     Mental Status: He is alert and oriented to person, place, and time. Mental status is at baseline.  Psychiatric:        Mood and  Affect: Mood normal.        Behavior: Behavior normal.        Thought Content: Thought content normal.        Judgment: Judgment normal.      MD Evaluation Airway: WNL Heart: WNL Abdomen: WNL Chest/ Lungs: WNL ASA  Classification: 3 Mallampati/Airway Score: Two   Imaging: No results found.  Labs:  CBC: Recent Labs    12/09/22 0756 12/17/22 1037 12/23/22 1157 12/30/22 0958  WBC 2.8* 3.0* 2.3* 3.2*  HGB 10.0* 9.7* 8.9* 9.9*  HCT 30.9* 29.7* 27.4* 29.2*  PLT 144* 98* 59* 165    COAGS: Recent Labs    01/01/23 1230  INR 1.0    BMP: Recent Labs    12/09/22 0756 12/17/22 1037 12/23/22 1157 12/30/22 0958  NA 140 139 141 140  K 4.1 4.2 4.2 3.8  CL 104 102 103 103  CO2 27 29 31 27   GLUCOSE 146* 173* 148* 173*  BUN 15 23 27* 17  CALCIUM 8.3* 8.7* 8.3* 9.3  CREATININE 1.09 0.99 1.15 1.01  GFRNONAA >60 >60 >60 >60    LIVER FUNCTION TESTS: Recent Labs    12/09/22 0756 12/17/22 1037 12/23/22 1157 12/30/22 0958  BILITOT 0.4 0.4 0.4 0.3  AST 16 16 19 18   ALT 14 18 18 17   ALKPHOS 60 60 69 65  PROT 6.4* 6.0* 6.2* 6.4*  ALBUMIN 3.8 3.9 3.9 3.9    TUMOR MARKERS: No results for input(s): "AFPTM", "CEA", "CA199", "CHROMGRNA" in the last 8760 hours.  Assessment and Plan: Patient with past medical history of small cell lung cancer presents with complaint of liver lesion. He has plans for upcoming SBRT and is in need of fiducial marker placement. IR consulted for fiducial marker placement at the request of Ronny Bacon, PA. Case reviewed by Dr. Lowella Dandy who approves patient for procedure.  Patient presents today in their usual state of health.  He has been NPO and is not currently on blood thinners.   Risks and benefits was discussed with the patient and/or patient's family including, but not limited to bleeding, infection, damage to adjacent structures or low yield requiring additional tests.  All of the questions were answered and there is agreement  to proceed.  Consent signed and in chart.  Thank you for this interesting consult.  I greatly enjoyed meeting Adam Wise and look forward to participating in their care.  A copy of this report was sent to the requesting provider on this date.  Electronically Signed: Hoyt Koch, PA 01/01/2023, 1:02 PM   I spent a total of  30 Minutes   in face to face in clinical consultation, greater than 50% of which was counseling/coordinating care for liver lesion.

## 2023-01-05 ENCOUNTER — Ambulatory Visit
Admission: RE | Admit: 2023-01-05 | Discharge: 2023-01-05 | Disposition: A | Discharge status: Home / Self Care | Payer: Medicare Other | Source: Ambulatory Visit | Attending: Radiation Oncology | Admitting: Radiation Oncology

## 2023-01-05 ENCOUNTER — Other Ambulatory Visit: Payer: Self-pay

## 2023-01-05 VITALS — BP 118/78 | HR 85 | Temp 97.0°F | Resp 18 | Ht 68.0 in | Wt 299.5 lb

## 2023-01-05 DIAGNOSIS — Z87891 Personal history of nicotine dependence: Secondary | ICD-10-CM | POA: Diagnosis not present

## 2023-01-05 DIAGNOSIS — C787 Secondary malignant neoplasm of liver and intrahepatic bile duct: Secondary | ICD-10-CM | POA: Insufficient documentation

## 2023-01-05 DIAGNOSIS — Z5111 Encounter for antineoplastic chemotherapy: Secondary | ICD-10-CM | POA: Diagnosis not present

## 2023-01-05 DIAGNOSIS — C3411 Malignant neoplasm of upper lobe, right bronchus or lung: Secondary | ICD-10-CM | POA: Insufficient documentation

## 2023-01-05 DIAGNOSIS — Z452 Encounter for adjustment and management of vascular access device: Secondary | ICD-10-CM | POA: Diagnosis not present

## 2023-01-05 DIAGNOSIS — Z79899 Other long term (current) drug therapy: Secondary | ICD-10-CM | POA: Diagnosis not present

## 2023-01-05 DIAGNOSIS — Z510 Encounter for antineoplastic radiation therapy: Secondary | ICD-10-CM | POA: Insufficient documentation

## 2023-01-05 MED ORDER — HEPARIN SOD (PORK) LOCK FLUSH 100 UNIT/ML IV SOLN
500.0000 [IU] | Freq: Once | INTRAVENOUS | Status: AC
  Administered 2023-01-05: 500 [IU] via INTRAVENOUS

## 2023-01-05 MED ORDER — SODIUM CHLORIDE 0.9% FLUSH
10.0000 mL | Freq: Once | INTRAVENOUS | Status: AC
  Administered 2023-01-05: 10 mL via INTRAVENOUS

## 2023-01-05 NOTE — Progress Notes (Signed)
Has armband been applied? Yes   Does patient have an allergy to IV contrast dye?: No   Has patient ever received premedication for IV contrast dye?:  n/a  Does patient take metformin?: Yes  If patient does take metformin when was the last dose: This AM  Date of lab work: 12/30/2022 BUN: 17 CR: 1.01 eGfr: >60  IV site: Right Chest Port  Has IV site been added to flowsheet?  Yes  BP 118/78 (BP Location: Left Arm, Patient Position: Sitting)   Pulse 85   Temp (!) 97 F (36.1 C) (Temporal)   Resp 18   Ht 5\' 8"  (1.727 m)   Wt 299 lb 8 oz (135.9 kg)   SpO2 98%   BMI 45.54 kg/m     Adam Wise M. M, BSN

## 2023-01-06 ENCOUNTER — Other Ambulatory Visit: Payer: Medicare Other

## 2023-01-06 ENCOUNTER — Other Ambulatory Visit: Payer: Self-pay

## 2023-01-06 ENCOUNTER — Inpatient Hospital Stay (HOSPITAL_BASED_OUTPATIENT_CLINIC_OR_DEPARTMENT_OTHER): Payer: Medicare Other | Admitting: Internal Medicine

## 2023-01-06 ENCOUNTER — Inpatient Hospital Stay: Payer: Medicare Other

## 2023-01-06 VITALS — BP 135/69 | HR 79 | Temp 98.0°F | Resp 16 | Wt 299.0 lb

## 2023-01-06 DIAGNOSIS — Z452 Encounter for adjustment and management of vascular access device: Secondary | ICD-10-CM | POA: Diagnosis not present

## 2023-01-06 DIAGNOSIS — Z95828 Presence of other vascular implants and grafts: Secondary | ICD-10-CM

## 2023-01-06 DIAGNOSIS — C3411 Malignant neoplasm of upper lobe, right bronchus or lung: Secondary | ICD-10-CM

## 2023-01-06 DIAGNOSIS — C787 Secondary malignant neoplasm of liver and intrahepatic bile duct: Secondary | ICD-10-CM | POA: Diagnosis not present

## 2023-01-06 DIAGNOSIS — Z5111 Encounter for antineoplastic chemotherapy: Secondary | ICD-10-CM | POA: Diagnosis not present

## 2023-01-06 DIAGNOSIS — D649 Anemia, unspecified: Secondary | ICD-10-CM

## 2023-01-06 DIAGNOSIS — Z510 Encounter for antineoplastic radiation therapy: Secondary | ICD-10-CM | POA: Diagnosis not present

## 2023-01-06 DIAGNOSIS — Z79899 Other long term (current) drug therapy: Secondary | ICD-10-CM | POA: Diagnosis not present

## 2023-01-06 LAB — CMP (CANCER CENTER ONLY)
ALT: 16 U/L (ref 0–44)
AST: 17 U/L (ref 15–41)
Albumin: 3.7 g/dL (ref 3.5–5.0)
Alkaline Phosphatase: 64 U/L (ref 38–126)
Anion gap: 8 (ref 5–15)
BUN: 17 mg/dL (ref 8–23)
CO2: 30 mmol/L (ref 22–32)
Calcium: 8.9 mg/dL (ref 8.9–10.3)
Chloride: 103 mmol/L (ref 98–111)
Creatinine: 1.08 mg/dL (ref 0.61–1.24)
GFR, Estimated: >60 mL/min (ref >60)
Glucose, Bld: 140 mg/dL — ABNORMAL HIGH (ref 70–99)
Potassium: 4.1 mmol/L (ref 3.5–5.1)
Sodium: 141 mmol/L (ref 135–145)
Total Bilirubin: 0.3 mg/dL (ref 0.3–1.2)
Total Protein: 6 g/dL — ABNORMAL LOW (ref 6.5–8.1)

## 2023-01-06 LAB — CBC WITH DIFFERENTIAL (CANCER CENTER ONLY)
Abs Immature Granulocytes: 0.03 10*3/uL (ref 0.00–0.07)
Basophils Absolute: 0 10*3/uL (ref 0.0–0.1)
Basophils Relative: 1 %
Eosinophils Absolute: 0.1 10*3/uL (ref 0.0–0.5)
Eosinophils Relative: 2 %
HCT: 27.9 % — ABNORMAL LOW (ref 39.0–52.0)
Hemoglobin: 9.3 g/dL — ABNORMAL LOW (ref 13.0–17.0)
Immature Granulocytes: 1 %
Lymphocytes Relative: 10 %
Lymphs Abs: 0.5 10*3/uL — ABNORMAL LOW (ref 0.7–4.0)
MCH: 32 pg (ref 26.0–34.0)
MCHC: 33.3 g/dL (ref 30.0–36.0)
MCV: 95.9 fL (ref 80.0–100.0)
Monocytes Absolute: 0.5 10*3/uL (ref 0.1–1.0)
Monocytes Relative: 11 %
Neutro Abs: 3.7 10*3/uL (ref 1.7–7.7)
Neutrophils Relative %: 75 %
Platelet Count: 180 10*3/uL (ref 150–400)
RBC: 2.91 MIL/uL — ABNORMAL LOW (ref 4.22–5.81)
RDW: 16.3 % — ABNORMAL HIGH (ref 11.5–15.5)
WBC Count: 4.8 10*3/uL (ref 4.0–10.5)
nRBC: 0 % (ref 0.0–0.2)

## 2023-01-06 LAB — SAMPLE TO BLOOD BANK

## 2023-01-06 MED ORDER — SODIUM CHLORIDE 0.9% FLUSH
10.0000 mL | Freq: Once | INTRAVENOUS | Status: AC
  Administered 2023-01-06: 10 mL

## 2023-01-06 MED ORDER — SODIUM CHLORIDE 0.9% FLUSH: 10.0000 mL | INTRAVENOUS | Status: DC | PRN

## 2023-01-06 MED ORDER — FAMOTIDINE IN NACL 20-0.9 MG/50ML-% IV SOLN
20.0000 mg | Freq: Once | INTRAVENOUS | Status: AC
  Administered 2023-01-06: 20 mg via INTRAVENOUS
  Filled 2023-01-06: qty 50

## 2023-01-06 MED ORDER — TRILACICLIB DIHYDROCHLORIDE INJECTION 300 MG
240.0000 mg/m2 | Freq: Once | INTRAVENOUS | Status: AC
  Administered 2023-01-06: 600 mg via INTRAVENOUS
  Filled 2023-01-06: qty 40

## 2023-01-06 MED ORDER — SODIUM CHLORIDE 0.9 % IV SOLN
80.0000 mg/m2 | Freq: Once | INTRAVENOUS | Status: AC
  Administered 2023-01-06: 210 mg via INTRAVENOUS
  Filled 2023-01-06: qty 10.5

## 2023-01-06 MED ORDER — SODIUM CHLORIDE 0.9 % IV SOLN
10.0000 mg | Freq: Once | INTRAVENOUS | Status: AC
  Administered 2023-01-06: 10 mg via INTRAVENOUS
  Filled 2023-01-06: qty 10

## 2023-01-06 MED ORDER — HEPARIN SOD (PORK) LOCK FLUSH 100 UNIT/ML IV SOLN: 500.0000 [IU] | Freq: Once | INTRAVENOUS | Status: DC | PRN

## 2023-01-06 MED ORDER — SODIUM CHLORIDE 0.9 % IV SOLN
150.0000 mg | Freq: Once | INTRAVENOUS | Status: AC
  Administered 2023-01-06: 150 mg via INTRAVENOUS
  Filled 2023-01-06: qty 150

## 2023-01-06 MED ORDER — SODIUM CHLORIDE 0.9 % IV SOLN
600.0000 mg | Freq: Once | INTRAVENOUS | Status: AC
  Administered 2023-01-06: 600 mg via INTRAVENOUS
  Filled 2023-01-06: qty 60

## 2023-01-06 MED ORDER — SODIUM CHLORIDE 0.9 % IV SOLN: Freq: Once | INTRAVENOUS | Status: AC

## 2023-01-06 MED ORDER — PALONOSETRON HCL INJECTION 0.25 MG/5ML
0.2500 mg | Freq: Once | INTRAVENOUS | Status: AC
  Administered 2023-01-06: 0.25 mg via INTRAVENOUS
  Filled 2023-01-06: qty 5

## 2023-01-06 MED FILL — Dexamethasone Sodium Phosphate Inj 100 MG/10ML: INTRAMUSCULAR | Qty: 1 | Status: AC

## 2023-01-06 NOTE — Patient Instructions (Signed)
[facility]  Discharge Instructions: Thank you for choosing [facility] to provide your oncology and hematology care.   If you have a lab appointment with the Cancer Center, please go directly to the Cancer Center and check in at the registration area.   Wear comfortable clothing and clothing appropriate for easy access to any Portacath or PICC line.   We strive to give you quality time with your provider. You may need to reschedule your appointment if you arrive late (15 or more minutes).  Arriving late affects you and other patients whose appointments are after yours.  Also, if you miss three or more appointments without notifying the office, you may be dismissed from the clinic at the provider's discretion.      For prescription refill requests, have your pharmacy contact our office and allow 72 hours for refills to be completed.    Today you received the following chemotherapy and/or immunotherapy agents: Cosela, Carboplatin, Etoposide.       To help prevent nausea and vomiting after your treatment, we encourage you to take your nausea medication as directed.  BELOW ARE SYMPTOMS THAT SHOULD BE REPORTED IMMEDIATELY: *FEVER GREATER THAN 100.4 F (38 C) OR HIGHER *CHILLS OR SWEATING *NAUSEA AND VOMITING THAT IS NOT CONTROLLED WITH YOUR NAUSEA MEDICATION *UNUSUAL SHORTNESS OF BREATH *UNUSUAL BRUISING OR BLEEDING *URINARY PROBLEMS (pain or burning when urinating, or frequent urination) *BOWEL PROBLEMS (unusual diarrhea, constipation, pain near the anus) TENDERNESS IN MOUTH AND THROAT WITH OR WITHOUT PRESENCE OF ULCERS (sore throat, sores in mouth, or a toothache) UNUSUAL RASH, SWELLING OR PAIN  UNUSUAL VAGINAL DISCHARGE OR ITCHING   Items with * indicate a potential emergency and should be followed up as soon as possible or go to the Emergency Department if any problems should occur.  Please show the CHEMOTHERAPY ALERT CARD or  IMMUNOTHERAPY ALERT CARD at check-in to the Emergency Department and triage nurse.  Should you have questions after your visit or need to cancel or reschedule your appointment, please contact [facility]  Dept: [phone]  and follow the prompts.  Office hours are 8:00 a.m. to 4:30 p.m. Monday - Friday. Please note that voicemails left after 4:00 p.m. may not be returned until the following business day.  We are closed weekends and major holidays. You have access to a nurse at all times for urgent questions. Please call the main number to the clinic Dept: [phone] and follow the prompts.   For any non-urgent questions, you may also contact your provider using MyChart. We now offer e-Visits for anyone 74 and older to request care online for non-urgent symptoms. For details visit [email or web address].   Also download the MyChart app! Go to the app store, search "MyChart", open the app, select [place], and log in with your MyChart username and password.

## 2023-01-06 NOTE — Progress Notes (Signed)
[facility] [phone]   Fax:([phone]  OFFICE PROGRESS NOTE  Doreene Nest, NP [address] [place] [address]  DIAGNOSIS: Relapsed extensive stage (T4, N3, M1c)  small cell lung cancer diagnosed in April 2021 and presented with large right upper lobe/right hilar mass with ipsilateral and contralateral mediastinal and right supraclavicular lymphadenopathy in addition to multiple liver lesions. He has disease progression after the first line of chemotherapy in December 2021.  PRIOR THERAPY: 1) Palliative radiotherapy to the right upper lobe lung mass under the care of Dr. Mitzi Hansen. 2) Systemic chemotherapy with carboplatin for AUC of 5 on day 1, etoposide 100 mg/M2 on days 1, 2 and 3 in addition to Imfinzi 1500 mg IV every 3 weeks with chemotherapy then every 4 weeks for maintenance if the patient has no evidence for progression.  He will also receive Cosela 240 mg/m2 on the days of the chemotherapy.  Status post 9 cycles.  Starting from cycle #5 the patient will be on maintenance treatment with immunotherapy with Imfinzi 1500 mg IV every 4 weeks. Last dose of chemotherapy was given on November 13, 2020. This treatment was discontinued secondary to disease progression 3) Systemic chemotherapy with carboplatin for AUC of 5 on day 1, etoposide 100 mg/M2 on days 1, 2 and 3, Tecentriq 1200 mg IV every 3 weeks as well as Cosela 250 mg/M2 on the days of the chemotherapy every 3 weeks.  First dose December 18, 2020.  Status post 8 cycles. 4) Zepzelca (lurbinectedin) 3.2 mgm2 IV every 3 weeks. Last dose on 01/23/22. Status post 12 cycles.  5) Palliative systemic chemotherapy with irinotecan 65 mg/m2 on days 1 and 8 IV every 3 weeks.  Status post 3 cycles.  Last dose was given April 01, 2022 discontinued secondary to disease progression   CURRENT THERAPY: systemic chemotherapy with carboplatin for AUC of 5 on day 1, etoposide 100 Mg/M2 on days 1, 2 and 3 with  Cosela before the chemotherapy.  First dose expected to start on 05/28/2022.  Status post 10 cycles.  Starting from cycle #5 his carboplatin will be reduced to AUC of 4 and 2 etoposide 80 Mg/M2.  INTERVAL HISTORY: Adam Wise 72 y.o. male returns to the clinic today for follow-up visit accompanied by his wife.  The patient is feeling fine today with no concerning complaints except for the mild fatigue and shortness of breath with exertion but he denied having any significant chest pain, cough or hemoptysis.  He denied having any nausea, vomiting, diarrhea or constipation.  He has no headache or visual changes.  He denied having any recent weight loss or night sweats.  He was referred to Dr. Janann August at [facility] for consideration of clinical trial for small cell lung cancer but there is no available trial for him right now.  He was advised to continue with his current treatment with the systemic chemotherapy as well as the SBRT to the liver lesion as previously planned.  He is here today for evaluation before starting cycle #11 of his treatment.  MEDICAL HISTORY: Past Medical History:  Diagnosis Date   Chickenpox    Chronic knee pain    Chronic low back pain    COPD exacerbation (HCC) 10/11/2021   COPD with exacerbation (HCC) 10/12/2021   Coronary artery calcification seen on CAT scan 11/2021   Coronary CTA 11/27/2021: Moderate to large right pleural effusion and compressive atelectasis right lung base. => Coronary  Calcium Score 657.  Diffuse RCA calcification.  Minimal mild disease in the LAD and diagonal branches. == Overall limited study.  Notable artifact.   Essential hypertension    GERD (gastroesophageal reflux disease)    Hyperlipidemia    Malignant neoplasm of upper lobe of right lung ([place]) 04/04/2020   OSA (obstructive sleep apnea)    With nighttime oxygen supplementation   T4, M3, M1 C Metastatic Small Cell Lung Cancer 03/2020   large right upper lobe/right hilar mass with  ipsilateral and contralateral mediastinal and right supraclavicular lymphadenopathy in addition to multiple liver lesios. He has disease progression after the first line of chemotherapy in December 2021.   Testosterone deficiency    Type 2 diabetes mellitus (HCC)     ALLERGIES:  is allergic to bupropion and hydrochlorothiazide.  MEDICATIONS:  Current Outpatient Medications  Medication Sig Dispense Refill   albuterol (VENTOLIN HFA) 108 (90 Base) MCG/ACT inhaler Inhale 2 puffs into the lungs every 6 (six) hours as needed for wheezing or shortness of breath. 18 g 0   amLODipine (NORVASC) 10 MG tablet TAKE 1 TABLET BY MOUTH EVERY DAY FOR BLOOD PRESSURE 90 tablet 1   B-D 3CC LUER-LOK SYR 22GX1" 22G X 1" 3 ML MISC USE AS INSTRUCTED FOR TESTOSTERONE INJECTION EVERY 2 WEEKS 10 each 2   bisoprolol (ZEBETA) 5 MG tablet TAKE 1 TABLET (5 MG TOTAL) BY MOUTH DAILY. 90 tablet 2   Black Pepper-Turmeric (TURMERIC COMPLEX/BLACK PEPPER PO) Take 1 tablet by mouth in the morning and at bedtime.     blood glucose meter kit and supplies KIT Dispense based on patient and insurance preference. Use up to four times daily as directed. 1 each 0   Budeson-Glycopyrrol-Formoterol (BREZTRI AEROSPHERE) 160-9-4.8 MCG/ACT AERO Inhale 2 puffs into the lungs in the morning and at bedtime. 10.7 g 3   chlorpheniramine-HYDROcodone (TUSSIONEX) 10-8 MG/5ML Take 5 mLs by mouth every 12 (twelve) hours as needed for cough. 473 mL 0   Coenzyme Q10 (COQ10) 200 MG CAPS Take 200 mg by mouth at bedtime.     ELIQUIS 5 MG TABS tablet TAKE 1 TABLET BY MOUTH TWICE A DAY 180 tablet 2   esomeprazole (NEXIUM) 20 MG capsule Take 20 mg by mouth in the morning.     gabapentin (NEURONTIN) 300 MG capsule TAKE 1 CAPSULE (300 MG TOTAL) BY MOUTH 2 (TWO) TIMES DAILY. FOR BACK PAIN. 180 capsule 2   glipiZIDE (GLUCOTROL) 10 MG tablet TAKE 1 TABLET (10 MG TOTAL) BY MOUTH 2 (TWO) TIMES DAILY BEFORE A MEAL. FOR DIABETES. 180 tablet 1   glucose blood (ONETOUCH  ULTRA) test strip USE UP TO 4 TIMES DAILY AS DIRECTED 400 strip 5   Insulin Glargine (BASAGLAR KWIKPEN) 100 UNIT/ML Inject 24 Units into the skin at bedtime. For diabetes. 30 mL 1   Insulin Pen Needle (BD PEN NEEDLE NANO U/F) 32G X 4 MM MISC Use with insulin as prescribed Dx Code: E11.9 200 each 3   ipratropium-albuterol (DUONEB) 0.5-2.5 (3) MG/3ML SOLN Take 3 mLs by nebulization every 4 (four) hours while awake for 3 days, THEN 3 mLs every 4 (four) hours as needed (shortness of breath or wheezing). 360 mL 0   KRILL OIL PO Take 1 capsule by mouth daily.     Lancets MISC USE UP TO 3 TIMES DAILY AS DIRECTED 100 each 2   losartan (COZAAR) 50 MG tablet TAKE 1 TABLET (50 MG TOTAL) BY MOUTH DAILY. FOR BLOOD PRESSURE. 90 tablet 2   meclizine (  ANTIVERT) 12.5 MG tablet Take 1 tablet (12.5 mg total) by mouth 3 (three) times daily as needed for dizziness. (Patient not taking: Reported on 11/12/2022) 30 tablet 1   Melatonin 10 MG CAPS Take 10 mg by mouth at bedtime.     metFORMIN (GLUCOPHAGE) 1000 MG tablet TAKE 1 TABLET (1,000 MG TOTAL) BY MOUTH 2 (TWO) TIMES DAILY WITH A MEAL. FOR DIABETES 180 tablet 1   Multiple Vitamin (MULTI-VITAMINS) TABS Take 1 tablet by mouth in the morning.     oxymetazoline (AFRIN) 0.05 % nasal spray Place 1 spray into both nostrils daily as needed for congestion.     pioglitazone (ACTOS) 45 MG tablet TAKE 1 TABLET (45 MG TOTAL) BY MOUTH DAILY. FOR DIABETES. 90 tablet 1   pravastatin (PRAVACHOL) 40 MG tablet TAKE 1 TABLET BY MOUTH EVERY DAY IN THE EVENING FOR CHOLESTEROL 90 tablet 2   testosterone cypionate (DEPOTESTOSTERONE CYPIONATE) 200 MG/ML injection Inject 200 mg into the muscle every 14 (fourteen) days.     traMADol (ULTRAM) 50 MG tablet Take 1 tablet (50 mg total) by mouth every 6 (six) hours as needed. 15 tablet 0   No current facility-administered medications for this visit.    SURGICAL HISTORY:  Past Surgical History:  Procedure Laterality Date   CHEST TUBE INSERTION  Right 01/20/2022   Procedure: CHEST TUBE INSERTION;  Surgeon: Lorin Glass, MD;  Location: [facility] ENDOSCOPY;  Service: Pulmonary;  Laterality: Right;  w/ Talc Pleurodesis, planned admit for Obs afterwards   CHEST TUBE INSERTION Right 04/28/2022   Procedure: INSERTION PLEURAL DRAINAGE CATHETER - Pigtail tail, drainage;  Surgeon: Lorin Glass, MD;  Location: [facility] ENDOSCOPY;  Service: Pulmonary;  Laterality: Right;  talc pleurodesis   CHEST TUBE INSERTION Right 09/01/2022   Procedure: INSERTION PLEURAL DRAINAGE CATHETER;  Surgeon: Lupita Leash, MD;  Location: [facility] ENDOSCOPY;  Service: Cardiopulmonary;  Laterality: Right;  pigtail catheter placement with talc pleurodesis   COLONOSCOPY WITH PROPOFOL N/A 12/17/2018   Procedure: COLONOSCOPY WITH PROPOFOL;  Surgeon: Pasty Spillers, MD;  Location: [facility] ENDOSCOPY;  Service: Gastroenterology;  Laterality: N/A;   IR IMAGING GUIDED PORT INSERTION  04/17/2020   IR THORACENTESIS ASP PLEURAL SPACE W/IMG GUIDE  11/29/2021   IR THORACENTESIS ASP PLEURAL SPACE W/IMG GUIDE  01/03/2022   JOINT REPLACEMENT Bilateral    REPLACEMENT TOTAL KNEE BILATERAL  2015   TALC PLEURODESIS  09/01/2022   Procedure: Lurlean Nanny;  Surgeon: Lupita Leash, MD;  Location: [facility] ENDOSCOPY;  Service: Cardiopulmonary;;   TONSILLECTOMY  1960   TRANSTHORACIC ECHOCARDIOGRAM  05/2020   a) 05/2020: EF 55 to 60%.  No R WMA.  Mild LVH.  Indeterminate LVEDP.  Unable to assess RVP.  Normal aortic and mitral valves.  Mildly elevated RAP.; b) 09/2021: EF 50-55%. No RWMA. Mild LVH. ~ LVEDP. Mild LA Dilation. NORMAL RV/RAP.  Normal MV/AoV.    REVIEW OF SYSTEMS:  A comprehensive review of systems was negative except for: Constitutional: positive for fatigue Respiratory: positive for dyspnea on exertion   PHYSICAL EXAMINATION: General appearance: alert, cooperative, fatigued, and no distress Head: Normocephalic, without obvious abnormality, atraumatic Neck: no adenopathy, no JVD, supple,  symmetrical, trachea midline, and thyroid not enlarged, symmetric, no tenderness/mass/nodules Lymph nodes: Cervical, supraclavicular, and axillary nodes normal. Resp: clear to auscultation bilaterally Back: symmetric, no curvature. ROM normal. No CVA tenderness. Cardio: regular rate and rhythm, S1, S2 normal, no murmur, click, rub or gallop GI: soft, non-tender; bowel sounds normal; no masses,  no organomegaly Extremities:  extremities normal, atraumatic, no cyanosis or edema  ECOG PERFORMANCE STATUS: 1 - Symptomatic but completely ambulatory  Blood pressure 135/69, pulse 79, temperature 98 F (36.7 C), temperature source Oral, resp. rate 16, weight 299 lb (135.6 kg), SpO2 98 %.   LABORATORY DATA: Lab Results  Component Value Date   WBC 4.8 01/06/2023   HGB 9.3 (L) 01/06/2023   HCT 27.9 (L) 01/06/2023   MCV 95.9 01/06/2023   PLT 180 01/06/2023      Chemistry      Component Value Date/Time   NA 140 12/30/2022 0958   K 3.8 12/30/2022 0958   CL 103 12/30/2022 0958   CO2 27 12/30/2022 0958   BUN 17 12/30/2022 0958   CREATININE 1.01 12/30/2022 0958   CREATININE 1.45 (H) 10/18/2021 1533      Component Value Date/Time   CALCIUM 9.3 12/30/2022 0958   ALKPHOS 65 12/30/2022 0958   AST 18 12/30/2022 0958   ALT 17 12/30/2022 0958   BILITOT 0.3 12/30/2022 0958       RADIOGRAPHIC STUDIES:    ASSESSMENT AND PLAN: This is a very pleasant 72 year old Caucasian male diagnosed with extensive stage (T4, N3, M1C) small cell lung cancer presented with large right upper lobe lung mass in addition to mediastinal and right supraclavicular lymphadenopathy and multiple metastatic liver lesions diagnosed in April 2021. The patient initially underwent systemic chemotherapy with carboplatin for an AUC of 5 on day 1, etoposide 100 mg per metered squared on days 1, 2, and 3 in addition to [facility] for myeloprotection.  He also received immunotherapy with Imfinzi on day 1 of every chemotherapy cycle.  He  is status post 9 cycles.  Starting from cycle #5 he was on single agent immunotherapy with Imfinzi IV every 4 weeks.  The patient had evidence of disease progression on his scan from December 2021.  He was then started on systemic chemotherapy with carboplatin for an AUC of 5 on day 1, etoposide 100 mg per metered squared on days 1, 2, and 3 in addition to Tecentriq 1200 mg on day 1, the patient is status post 9 cycles.  Starting from cycle #5, the patient started maintenance immunotherapy with Tecentriq.  This was discontinued due to evidence of disease progression. The patient started third line treatment with Zepzelca (lurbinectedin) 3.2 Mg/M2 every 3-week status post 12 cycles of this treatment and he has been tolerating it fairly well except for fatigue.  This treatment was discontinued secondary to disease progression. He underwent fourth line treatment with systemic chemotherapy with irinotecan 65 Mg/M2 on days 1 and 8 every 3 weeks.  Status post 3 cycles.  He tolerated this treatment well with no concerning adverse effect except for mild fatigue. Unfortunately his scan showed evidence for disease progression especially in the liver. I recommended for the patient to discontinue his current treatment with irinotecan at this point. He started systemic chemotherapy again with carboplatin for AUC of 5 on day 1, etoposide 100 Mg/M2 on days 1, 2 and 3 with Cosela status post 10 cycles. I did reduce the dose of carboplatin to AUC of 4 and etoposide 80 Mg/M2 starting from cycle #5.  He has been tolerating this treatment fairly well with no concerning adverse effect except for mild fatigue. The patient was seen by Dr. Eloise Harman at [facility] for consideration of clinical trial but none is available for him at this point.  Dr. Janann August recommended for him to continue on the current systemic  chemotherapy and palliative radiation. I recommended for the patient to proceed with cycle #11 of his systemic  chemotherapy today as planned. The patient was seen by Dr. Mitzi Hansen and he had fiducial placed for the SBRT of the progressive liver lesions and expected to start this treatment in the next few days. I will see him back for follow-up visit in 3 weeks for evaluation before starting cycle #12 and we will repeat imaging studies at that time. He was advised to call immediately if he has any other concerning symptoms in the interval. The patient voices understanding of current disease status and treatment options and is in agreement with the current care plan.  All questions were answered. The patient knows to call the clinic with any problems, questions or concerns. We can certainly see the patient much sooner if necessary.  The total time spent in the appointment was 30 minutes.  Disclaimer: This note was dictated with voice recognition software. Similar sounding words can inadvertently be transcribed and may not be corrected upon review. Lajuana Matte, MD 01/06/23

## 2023-01-07 ENCOUNTER — Other Ambulatory Visit: Payer: Self-pay

## 2023-01-07 ENCOUNTER — Inpatient Hospital Stay: Payer: Medicare Other

## 2023-01-07 VITALS — BP 142/79 | HR 93 | Temp 97.8°F | Resp 20

## 2023-01-07 DIAGNOSIS — C787 Secondary malignant neoplasm of liver and intrahepatic bile duct: Secondary | ICD-10-CM | POA: Diagnosis not present

## 2023-01-07 DIAGNOSIS — Z79899 Other long term (current) drug therapy: Secondary | ICD-10-CM | POA: Diagnosis not present

## 2023-01-07 DIAGNOSIS — Z452 Encounter for adjustment and management of vascular access device: Secondary | ICD-10-CM | POA: Diagnosis not present

## 2023-01-07 DIAGNOSIS — C3411 Malignant neoplasm of upper lobe, right bronchus or lung: Secondary | ICD-10-CM

## 2023-01-07 DIAGNOSIS — Z510 Encounter for antineoplastic radiation therapy: Secondary | ICD-10-CM | POA: Diagnosis not present

## 2023-01-07 DIAGNOSIS — Z5111 Encounter for antineoplastic chemotherapy: Secondary | ICD-10-CM | POA: Diagnosis not present

## 2023-01-07 MED ORDER — SODIUM CHLORIDE 0.9 % IV SOLN
80.0000 mg/m2 | Freq: Once | INTRAVENOUS | Status: AC
  Administered 2023-01-07: 210 mg via INTRAVENOUS
  Filled 2023-01-07: qty 10.5

## 2023-01-07 MED ORDER — SODIUM CHLORIDE 0.9 % IV SOLN
10.0000 mg | Freq: Once | INTRAVENOUS | Status: AC
  Administered 2023-01-07: 10 mg via INTRAVENOUS
  Filled 2023-01-07: qty 10

## 2023-01-07 MED ORDER — SODIUM CHLORIDE 0.9 % IV SOLN: Freq: Once | INTRAVENOUS | Status: AC

## 2023-01-07 MED ORDER — TRILACICLIB DIHYDROCHLORIDE INJECTION 300 MG
240.0000 mg/m2 | Freq: Once | INTRAVENOUS | Status: AC
  Administered 2023-01-07: 600 mg via INTRAVENOUS
  Filled 2023-01-07: qty 40

## 2023-01-07 MED FILL — Dexamethasone Sodium Phosphate Inj 100 MG/10ML: INTRAMUSCULAR | Qty: 1 | Status: AC

## 2023-01-07 NOTE — Patient Instructions (Signed)
[facility]  Discharge Instructions: Thank you for choosing [facility] to provide your oncology and hematology care.   If you have a lab appointment with the [facility], please go directly to the [facility] and check in at the registration area.   Wear comfortable clothing and clothing appropriate for easy access to any Portacath or PICC line.   We strive to give you quality time with your provider. You may need to reschedule your appointment if you arrive late (15 or more minutes).  Arriving late affects you and other patients whose appointments are after yours.  Also, if you miss three or more appointments without notifying the office, you may be dismissed from the clinic at the provider's discretion.      For prescription refill requests, have your pharmacy contact our office and allow 72 hours for refills to be completed.    Today you received the following chemotherapy and/or immunotherapy agents: Cosela & Etoposide.       To help prevent nausea and vomiting after your treatment, we encourage you to take your nausea medication as directed.  BELOW ARE SYMPTOMS THAT SHOULD BE REPORTED IMMEDIATELY: *FEVER GREATER THAN 100.4 F (38 C) OR HIGHER *CHILLS OR SWEATING *NAUSEA AND VOMITING THAT IS NOT CONTROLLED WITH YOUR NAUSEA MEDICATION *UNUSUAL SHORTNESS OF BREATH *UNUSUAL BRUISING OR BLEEDING *URINARY PROBLEMS (pain or burning when urinating, or frequent urination) *BOWEL PROBLEMS (unusual diarrhea, constipation, pain near the anus) TENDERNESS IN MOUTH AND THROAT WITH OR WITHOUT PRESENCE OF ULCERS (sore throat, sores in mouth, or a toothache) UNUSUAL RASH, SWELLING OR PAIN  UNUSUAL VAGINAL DISCHARGE OR ITCHING   Items with * indicate a potential emergency and should be followed up as soon as possible or go to the Emergency Department if any problems should occur.  Please show the CHEMOTHERAPY ALERT CARD or IMMUNOTHERAPY ALERT  CARD at check-in to the Emergency Department and triage nurse.  Should you have questions after your visit or need to cancel or reschedule your appointment, please contact [facility]  Dept: [phone]  and follow the prompts.  Office hours are 8:00 a.m. to 4:30 p.m. Monday - Friday. Please note that voicemails left after 4:00 p.m. may not be returned until the following business day.  We are closed weekends and major holidays. You have access to a nurse at all times for urgent questions. Please call the main number to the clinic Dept: [phone] and follow the prompts.   For any non-urgent questions, you may also contact your provider using MyChart. We now offer e-Visits for anyone 68 and older to request care online for non-urgent symptoms. For details visit [email or web address].   Also download the MyChart app! Go to the app store, search "MyChart", open the app, select [facility], and log in with your MyChart username and password.

## 2023-01-08 ENCOUNTER — Other Ambulatory Visit: Payer: Self-pay

## 2023-01-08 ENCOUNTER — Inpatient Hospital Stay: Payer: Medicare Other

## 2023-01-08 VITALS — BP 141/79 | HR 85 | Temp 97.9°F | Resp 18

## 2023-01-08 DIAGNOSIS — C787 Secondary malignant neoplasm of liver and intrahepatic bile duct: Secondary | ICD-10-CM | POA: Diagnosis not present

## 2023-01-08 DIAGNOSIS — Z79899 Other long term (current) drug therapy: Secondary | ICD-10-CM | POA: Diagnosis not present

## 2023-01-08 DIAGNOSIS — Z5111 Encounter for antineoplastic chemotherapy: Secondary | ICD-10-CM | POA: Diagnosis not present

## 2023-01-08 DIAGNOSIS — C3411 Malignant neoplasm of upper lobe, right bronchus or lung: Secondary | ICD-10-CM

## 2023-01-08 DIAGNOSIS — Z510 Encounter for antineoplastic radiation therapy: Secondary | ICD-10-CM | POA: Diagnosis not present

## 2023-01-08 DIAGNOSIS — Z452 Encounter for adjustment and management of vascular access device: Secondary | ICD-10-CM | POA: Diagnosis not present

## 2023-01-08 MED ORDER — SODIUM CHLORIDE 0.9% FLUSH
10.0000 mL | INTRAVENOUS | Status: DC | PRN
  Administered 2023-01-08: 10 mL

## 2023-01-08 MED ORDER — SODIUM CHLORIDE 0.9 % IV SOLN
80.0000 mg/m2 | Freq: Once | INTRAVENOUS | Status: AC
  Administered 2023-01-08: 210 mg via INTRAVENOUS
  Filled 2023-01-08: qty 10.5

## 2023-01-08 MED ORDER — HEPARIN SOD (PORK) LOCK FLUSH 100 UNIT/ML IV SOLN
500.0000 [IU] | Freq: Once | INTRAVENOUS | Status: AC | PRN
  Administered 2023-01-08: 500 [IU]

## 2023-01-08 MED ORDER — SODIUM CHLORIDE 0.9 % IV SOLN: Freq: Once | INTRAVENOUS | Status: AC

## 2023-01-08 MED ORDER — TRILACICLIB DIHYDROCHLORIDE INJECTION 300 MG
240.0000 mg/m2 | Freq: Once | INTRAVENOUS | Status: AC
  Administered 2023-01-08: 600 mg via INTRAVENOUS
  Filled 2023-01-08: qty 40

## 2023-01-08 MED ORDER — SODIUM CHLORIDE 0.9 % IV SOLN
10.0000 mg | Freq: Once | INTRAVENOUS | Status: AC
  Administered 2023-01-08: 10 mg via INTRAVENOUS
  Filled 2023-01-08: qty 10

## 2023-01-08 NOTE — Patient Instructions (Signed)
[facility]  Discharge Instructions: Thank you for choosing [facility] to provide your oncology and hematology care.   If you have a lab appointment with the [facility], please go directly to the [facility] and check in at the registration area.   Wear comfortable clothing and clothing appropriate for easy access to any Portacath or PICC line.   We strive to give you quality time with your provider. You may need to reschedule your appointment if you arrive late (15 or more minutes).  Arriving late affects you and other patients whose appointments are after yours.  Also, if you miss three or more appointments without notifying the office, you may be dismissed from the clinic at the provider's discretion.      For prescription refill requests, have your pharmacy contact our office and allow 72 hours for refills to be completed.    Today you received the following chemotherapy and/or immunotherapy agents: Cosela, Etoposide.      To help prevent nausea and vomiting after your treatment, we encourage you to take your nausea medication as directed.  BELOW ARE SYMPTOMS THAT SHOULD BE REPORTED IMMEDIATELY: *FEVER GREATER THAN 100.4 F (38 C) OR HIGHER *CHILLS OR SWEATING *NAUSEA AND VOMITING THAT IS NOT CONTROLLED WITH YOUR NAUSEA MEDICATION *UNUSUAL SHORTNESS OF BREATH *UNUSUAL BRUISING OR BLEEDING *URINARY PROBLEMS (pain or burning when urinating, or frequent urination) *BOWEL PROBLEMS (unusual diarrhea, constipation, pain near the anus) TENDERNESS IN MOUTH AND THROAT WITH OR WITHOUT PRESENCE OF ULCERS (sore throat, sores in mouth, or a toothache) UNUSUAL RASH, SWELLING OR PAIN  UNUSUAL VAGINAL DISCHARGE OR ITCHING   Items with * indicate a potential emergency and should be followed up as soon as possible or go to the Emergency Department if any problems should occur.  Please show the CHEMOTHERAPY ALERT CARD or IMMUNOTHERAPY ALERT CARD  at check-in to the Emergency Department and triage nurse.  Should you have questions after your visit or need to cancel or reschedule your appointment, please contact [facility]  Dept: [phone]  and follow the prompts.  Office hours are 8:00 a.m. to 4:30 p.m. Monday - Friday. Please note that voicemails left after 4:00 p.m. may not be returned until the following business day.  We are closed weekends and major holidays. You have access to a nurse at all times for urgent questions. Please call the main number to the clinic Dept: [phone] and follow the prompts.   For any non-urgent questions, you may also contact your provider using MyChart. We now offer e-Visits for anyone 67 and older to request care online for non-urgent symptoms. For details visit [email or web address].   Also download the MyChart app! Go to the app store, search "MyChart", open the app, select [facility], and log in with your MyChart username and password.

## 2023-01-09 ENCOUNTER — Telehealth: Payer: Self-pay | Admitting: Internal Medicine

## 2023-01-09 DIAGNOSIS — C3411 Malignant neoplasm of upper lobe, right bronchus or lung: Secondary | ICD-10-CM | POA: Diagnosis not present

## 2023-01-09 DIAGNOSIS — Z87891 Personal history of nicotine dependence: Secondary | ICD-10-CM | POA: Diagnosis not present

## 2023-01-09 NOTE — Telephone Encounter (Signed)
Called patient to confirm upcoming appointments. Spoke with patient's spouse. Appointments confirmed. Spouse requested speaking to RN about adding more labs. Forwarded information to [employer].

## 2023-01-13 ENCOUNTER — Other Ambulatory Visit: Payer: Medicare Other

## 2023-01-13 DIAGNOSIS — C787 Secondary malignant neoplasm of liver and intrahepatic bile duct: Secondary | ICD-10-CM | POA: Diagnosis not present

## 2023-01-13 DIAGNOSIS — Z510 Encounter for antineoplastic radiation therapy: Secondary | ICD-10-CM | POA: Diagnosis not present

## 2023-01-13 DIAGNOSIS — C3411 Malignant neoplasm of upper lobe, right bronchus or lung: Secondary | ICD-10-CM | POA: Diagnosis not present

## 2023-01-13 DIAGNOSIS — Z87891 Personal history of nicotine dependence: Secondary | ICD-10-CM | POA: Diagnosis not present

## 2023-01-13 DIAGNOSIS — Z79899 Other long term (current) drug therapy: Secondary | ICD-10-CM | POA: Diagnosis not present

## 2023-01-13 DIAGNOSIS — Z452 Encounter for adjustment and management of vascular access device: Secondary | ICD-10-CM | POA: Diagnosis not present

## 2023-01-13 DIAGNOSIS — Z5111 Encounter for antineoplastic chemotherapy: Secondary | ICD-10-CM | POA: Diagnosis not present

## 2023-01-14 ENCOUNTER — Inpatient Hospital Stay: Payer: Medicare Other

## 2023-01-14 ENCOUNTER — Ambulatory Visit
Admission: RE | Admit: 2023-01-14 | Discharge: 2023-01-14 | Disposition: A | Discharge status: Home / Self Care | Payer: Medicare Other | Source: Ambulatory Visit | Attending: Radiation Oncology | Admitting: Radiation Oncology

## 2023-01-14 ENCOUNTER — Other Ambulatory Visit: Payer: Self-pay

## 2023-01-14 DIAGNOSIS — Z5111 Encounter for antineoplastic chemotherapy: Secondary | ICD-10-CM | POA: Diagnosis not present

## 2023-01-14 DIAGNOSIS — Z510 Encounter for antineoplastic radiation therapy: Secondary | ICD-10-CM | POA: Diagnosis not present

## 2023-01-14 DIAGNOSIS — C787 Secondary malignant neoplasm of liver and intrahepatic bile duct: Secondary | ICD-10-CM | POA: Diagnosis not present

## 2023-01-14 DIAGNOSIS — Z95828 Presence of other vascular implants and grafts: Secondary | ICD-10-CM

## 2023-01-14 DIAGNOSIS — Z79899 Other long term (current) drug therapy: Secondary | ICD-10-CM | POA: Diagnosis not present

## 2023-01-14 DIAGNOSIS — Z452 Encounter for adjustment and management of vascular access device: Secondary | ICD-10-CM | POA: Diagnosis not present

## 2023-01-14 DIAGNOSIS — D649 Anemia, unspecified: Secondary | ICD-10-CM

## 2023-01-14 DIAGNOSIS — C3411 Malignant neoplasm of upper lobe, right bronchus or lung: Secondary | ICD-10-CM | POA: Diagnosis not present

## 2023-01-14 LAB — RAD ONC ARIA SESSION SUMMARY
Course Elapsed Days: 0
Plan Fractions Treated to Date: 1
Plan Prescribed Dose Per Fraction: 12 Gy
Plan Total Fractions Prescribed: 5
Plan Total Prescribed Dose: 60 Gy
Reference Point Dosage Given to Date: 12 Gy
Reference Point Session Dosage Given: 12 Gy
Session Number: 1

## 2023-01-14 LAB — CMP (CANCER CENTER ONLY)
ALT: 20 U/L (ref 0–44)
AST: 23 U/L (ref 15–41)
Albumin: 3.7 g/dL (ref 3.5–5.0)
Alkaline Phosphatase: 66 U/L (ref 38–126)
Anion gap: 11 (ref 5–15)
BUN: 28 mg/dL — ABNORMAL HIGH (ref 8–23)
CO2: 26 mmol/L (ref 22–32)
Calcium: 8.6 mg/dL — ABNORMAL LOW (ref 8.9–10.3)
Chloride: 105 mmol/L (ref 98–111)
Creatinine: 1.1 mg/dL (ref 0.61–1.24)
GFR, Estimated: >60 mL/min (ref >60)
Glucose, Bld: 121 mg/dL — ABNORMAL HIGH (ref 70–99)
Potassium: 4.2 mmol/L (ref 3.5–5.1)
Sodium: 142 mmol/L (ref 135–145)
Total Bilirubin: 0.3 mg/dL (ref 0.3–1.2)
Total Protein: 6.6 g/dL (ref 6.5–8.1)

## 2023-01-14 LAB — CBC WITH DIFFERENTIAL (CANCER CENTER ONLY)
Abs Immature Granulocytes: 0.03 10*3/uL (ref 0.00–0.07)
Basophils Absolute: 0 10*3/uL (ref 0.0–0.1)
Basophils Relative: 1 %
Eosinophils Absolute: 0 10*3/uL (ref 0.0–0.5)
Eosinophils Relative: 1 %
HCT: 27.4 % — ABNORMAL LOW (ref 39.0–52.0)
Hemoglobin: 9.1 g/dL — ABNORMAL LOW (ref 13.0–17.0)
Immature Granulocytes: 1 %
Lymphocytes Relative: 18 %
Lymphs Abs: 0.6 10*3/uL — ABNORMAL LOW (ref 0.7–4.0)
MCH: 31.2 pg (ref 26.0–34.0)
MCHC: 33.2 g/dL (ref 30.0–36.0)
MCV: 93.8 fL (ref 80.0–100.0)
Monocytes Absolute: 0.2 10*3/uL (ref 0.1–1.0)
Monocytes Relative: 6 %
Neutro Abs: 2.6 10*3/uL (ref 1.7–7.7)
Neutrophils Relative %: 73 %
Platelet Count: 122 10*3/uL — ABNORMAL LOW (ref 150–400)
RBC: 2.92 MIL/uL — ABNORMAL LOW (ref 4.22–5.81)
RDW: 15.3 % (ref 11.5–15.5)
WBC Count: 3.6 10*3/uL — ABNORMAL LOW (ref 4.0–10.5)
nRBC: 0 % (ref 0.0–0.2)

## 2023-01-14 LAB — SAMPLE TO BLOOD BANK

## 2023-01-14 MED ORDER — SODIUM CHLORIDE 0.9% FLUSH
10.0000 mL | Freq: Once | INTRAVENOUS | Status: AC
  Administered 2023-01-14: 10 mL

## 2023-01-14 MED ORDER — HEPARIN SOD (PORK) LOCK FLUSH 100 UNIT/ML IV SOLN
500.0000 [IU] | Freq: Once | INTRAVENOUS | Status: AC
  Administered 2023-01-14: 500 [IU]

## 2023-01-16 ENCOUNTER — Ambulatory Visit: Payer: Medicare Other

## 2023-01-16 ENCOUNTER — Ambulatory Visit: Admission: RE | Admit: 2023-01-16 | Payer: Medicare Other | Source: Ambulatory Visit | Admitting: Radiation Oncology

## 2023-01-16 ENCOUNTER — Other Ambulatory Visit: Payer: Self-pay

## 2023-01-19 ENCOUNTER — Inpatient Hospital Stay: Payer: Medicare Other

## 2023-01-19 ENCOUNTER — Other Ambulatory Visit: Payer: Self-pay

## 2023-01-19 ENCOUNTER — Ambulatory Visit
Admission: RE | Admit: 2023-01-19 | Discharge: 2023-01-19 | Disposition: A | Discharge status: Home / Self Care | Payer: Medicare Other | Source: Ambulatory Visit | Attending: Radiation Oncology | Admitting: Radiation Oncology

## 2023-01-19 ENCOUNTER — Telehealth: Payer: Self-pay

## 2023-01-19 DIAGNOSIS — Z95828 Presence of other vascular implants and grafts: Secondary | ICD-10-CM | POA: Insufficient documentation

## 2023-01-19 DIAGNOSIS — C3411 Malignant neoplasm of upper lobe, right bronchus or lung: Secondary | ICD-10-CM | POA: Insufficient documentation

## 2023-01-19 DIAGNOSIS — Z452 Encounter for adjustment and management of vascular access device: Secondary | ICD-10-CM | POA: Insufficient documentation

## 2023-01-19 DIAGNOSIS — Z5111 Encounter for antineoplastic chemotherapy: Secondary | ICD-10-CM | POA: Insufficient documentation

## 2023-01-19 DIAGNOSIS — C3490 Malignant neoplasm of unspecified part of unspecified bronchus or lung: Secondary | ICD-10-CM | POA: Insufficient documentation

## 2023-01-19 DIAGNOSIS — C787 Secondary malignant neoplasm of liver and intrahepatic bile duct: Secondary | ICD-10-CM | POA: Insufficient documentation

## 2023-01-19 DIAGNOSIS — Z87891 Personal history of nicotine dependence: Secondary | ICD-10-CM | POA: Diagnosis not present

## 2023-01-19 DIAGNOSIS — Z510 Encounter for antineoplastic radiation therapy: Secondary | ICD-10-CM | POA: Diagnosis not present

## 2023-01-19 DIAGNOSIS — D649 Anemia, unspecified: Secondary | ICD-10-CM | POA: Insufficient documentation

## 2023-01-19 LAB — RAD ONC ARIA SESSION SUMMARY
Course Elapsed Days: 5
Plan Fractions Treated to Date: 1
Plan Prescribed Dose Per Fraction: 12 Gy
Plan Total Fractions Prescribed: 4
Plan Total Prescribed Dose: 48 Gy
Reference Point Dosage Given to Date: 24 Gy
Reference Point Session Dosage Given: 12 Gy
Session Number: 2

## 2023-01-19 LAB — CBC WITH DIFFERENTIAL (CANCER CENTER ONLY)
Abs Immature Granulocytes: 0.02 10*3/uL (ref 0.00–0.07)
Basophils Absolute: 0 10*3/uL (ref 0.0–0.1)
Basophils Relative: 1 %
Eosinophils Absolute: 0.1 10*3/uL (ref 0.0–0.5)
Eosinophils Relative: 2 %
HCT: 25.4 % — ABNORMAL LOW (ref 39.0–52.0)
Hemoglobin: 8.5 g/dL — ABNORMAL LOW (ref 13.0–17.0)
Immature Granulocytes: 1 %
Lymphocytes Relative: 16 %
Lymphs Abs: 0.5 10*3/uL — ABNORMAL LOW (ref 0.7–4.0)
MCH: 31.7 pg (ref 26.0–34.0)
MCHC: 33.5 g/dL (ref 30.0–36.0)
MCV: 94.8 fL (ref 80.0–100.0)
Monocytes Absolute: 0.4 10*3/uL (ref 0.1–1.0)
Monocytes Relative: 15 %
Neutro Abs: 1.8 10*3/uL (ref 1.7–7.7)
Neutrophils Relative %: 65 %
Platelet Count: 55 10*3/uL — ABNORMAL LOW (ref 150–400)
RBC: 2.68 MIL/uL — ABNORMAL LOW (ref 4.22–5.81)
RDW: 15.7 % — ABNORMAL HIGH (ref 11.5–15.5)
WBC Count: 2.8 10*3/uL — ABNORMAL LOW (ref 4.0–10.5)
nRBC: 0 % (ref 0.0–0.2)

## 2023-01-19 LAB — CMP (CANCER CENTER ONLY)
ALT: 16 U/L (ref 0–44)
AST: 16 U/L (ref 15–41)
Albumin: 3.6 g/dL (ref 3.5–5.0)
Alkaline Phosphatase: 65 U/L (ref 38–126)
Anion gap: 9 (ref 5–15)
BUN: 20 mg/dL (ref 8–23)
CO2: 27 mmol/L (ref 22–32)
Calcium: 8.1 mg/dL — ABNORMAL LOW (ref 8.9–10.3)
Chloride: 102 mmol/L (ref 98–111)
Creatinine: 1.01 mg/dL (ref 0.61–1.24)
GFR, Estimated: >60 mL/min (ref >60)
Glucose, Bld: 189 mg/dL — ABNORMAL HIGH (ref 70–99)
Potassium: 4 mmol/L (ref 3.5–5.1)
Sodium: 138 mmol/L (ref 135–145)
Total Bilirubin: 0.4 mg/dL (ref 0.3–1.2)
Total Protein: 5.8 g/dL — ABNORMAL LOW (ref 6.5–8.1)

## 2023-01-19 LAB — SAMPLE TO BLOOD BANK

## 2023-01-19 MED ORDER — HEPARIN SOD (PORK) LOCK FLUSH 100 UNIT/ML IV SOLN
500.0000 [IU] | Freq: Once | INTRAVENOUS | Status: AC
  Administered 2023-01-19: 500 [IU]

## 2023-01-19 MED ORDER — SODIUM CHLORIDE 0.9% FLUSH
10.0000 mL | Freq: Once | INTRAVENOUS | Status: AC
  Administered 2023-01-19: 10 mL

## 2023-01-19 NOTE — Telephone Encounter (Signed)
CRITICAL VALUE STICKER  CRITICAL VALUE: HGB 8.5  RECEIVER (on-site recipient of call): Jeremih Dearmas P. LPN  DATE & TIME NOTIFIED: 01/19/23 1052 AM   MESSENGER (representative from lab): HEATHER   MD NOTIFIED: C. HEILINGOETTER, PA-C  TIME OF NOTIFICATION: 1057 AM  RESPONSE:  No Recommendation at this time.

## 2023-01-20 ENCOUNTER — Telehealth: Payer: Self-pay | Admitting: Internal Medicine

## 2023-01-20 ENCOUNTER — Encounter (INDEPENDENT_AMBULATORY_CARE_PROVIDER_SITE_OTHER): Payer: Medicare Other

## 2023-01-20 DIAGNOSIS — G893 Neoplasm related pain (acute) (chronic): Secondary | ICD-10-CM

## 2023-01-20 DIAGNOSIS — M545 Low back pain, unspecified: Secondary | ICD-10-CM

## 2023-01-20 DIAGNOSIS — R4689 Other symptoms and signs involving appearance and behavior: Secondary | ICD-10-CM

## 2023-01-20 DIAGNOSIS — G8929 Other chronic pain: Secondary | ICD-10-CM | POA: Diagnosis not present

## 2023-01-20 DIAGNOSIS — C3411 Malignant neoplasm of upper lobe, right bronchus or lung: Secondary | ICD-10-CM

## 2023-01-20 NOTE — Telephone Encounter (Signed)
Called patient regarding upcoming February appointments, patient is notified.

## 2023-01-21 ENCOUNTER — Other Ambulatory Visit: Payer: Self-pay

## 2023-01-21 ENCOUNTER — Ambulatory Visit
Admission: RE | Admit: 2023-01-21 | Discharge: 2023-01-21 | Disposition: A | Discharge status: Home / Self Care | Payer: Medicare Other | Source: Ambulatory Visit | Attending: Radiation Oncology | Admitting: Radiation Oncology

## 2023-01-21 DIAGNOSIS — Z95828 Presence of other vascular implants and grafts: Secondary | ICD-10-CM | POA: Diagnosis not present

## 2023-01-21 DIAGNOSIS — C3411 Malignant neoplasm of upper lobe, right bronchus or lung: Secondary | ICD-10-CM | POA: Diagnosis not present

## 2023-01-21 DIAGNOSIS — D649 Anemia, unspecified: Secondary | ICD-10-CM | POA: Diagnosis not present

## 2023-01-21 DIAGNOSIS — C3490 Malignant neoplasm of unspecified part of unspecified bronchus or lung: Secondary | ICD-10-CM | POA: Diagnosis not present

## 2023-01-21 LAB — RAD ONC ARIA SESSION SUMMARY
Course Elapsed Days: 7
Plan Fractions Treated to Date: 2
Plan Prescribed Dose Per Fraction: 12 Gy
Plan Total Fractions Prescribed: 4
Plan Total Prescribed Dose: 48 Gy
Reference Point Dosage Given to Date: 36 Gy
Reference Point Session Dosage Given: 12 Gy
Session Number: 3

## 2023-01-21 MED ORDER — TRAMADOL HCL 50 MG PO TABS: 50.0000 mg | ORAL_TABLET | Freq: Every evening | ORAL | 0 refills | Status: DC | PRN

## 2023-01-21 NOTE — Telephone Encounter (Signed)
Please see the MyChart message reply(ies) for my assessment and plan.  The patient gave consent for this Medical Advice Message and is aware that it may result in a bill to their insurance company as well as the possibility that this may result in a co-payment or deductible. They are an established patient, but are not seeking medical advice exclusively about a problem treated during an in person or video visit in the last 7 days. I did not recommend an in person or video visit within 7 days of my reply.  I spent a total of 15 minutes cumulative time within 7 days through [place], NP

## 2023-01-22 NOTE — Therapy (Signed)
OUTPATIENT PHYSICAL THERAPY THORACOLUMBAR EVALUATION   Patient Name: Adam Wise MRN: [id number] DOB:1951-09-17, 72 y.o., male Today's Date: 01/26/2023  END OF SESSION:  PT End of Session - 01/26/23 1500     Visit Number 1    Number of Visits 16    Date for PT Re-Evaluation 03/23/23    Authorization Type 1/10 eval 01/26/23    PT Start Time 1514    PT Stop Time 1600    PT Time Calculation (min) 46 min    Activity Tolerance Patient limited by pain    Behavior During Therapy [facility] for tasks assessed/performed             Past Medical History:  Diagnosis Date   Chickenpox    Chronic knee pain    Chronic low back pain    COPD exacerbation ([place]) 10/11/2021   COPD with exacerbation ([place]) 10/12/2021   Coronary artery calcification seen on CAT scan 11/2021   Coronary CTA 11/27/2021: Moderate to large right pleural effusion and compressive atelectasis right lung base. => Coronary Calcium Score 657.  Diffuse RCA calcification.  Minimal mild disease in the LAD and diagonal branches. == Overall limited study.  Notable artifact.   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 He has disease progression after the first line of chemotherapy in December 2021.   Testosterone deficiency    Type 2 diabetes mellitus ([place])    Past Surgical History:  Procedure Laterality Date   CHEST TUBE INSERTION Right 01/20/2022   Procedure: CHEST TUBE INSERTION;  Surgeon: Candee Furbish, MD;  Location: [facility] ENDOSCOPY;  Service: Pulmonary;  Laterality: Right;  w/ Talc Pleurodesis, planned admit for Obs afterwards    CHEST TUBE INSERTION Right 04/28/2022   Procedure: INSERTION PLEURAL DRAINAGE CATHETER - Pigtail tail, drainage;  Surgeon: Candee Furbish, MD;  Location: [facility] ENDOSCOPY;  Service: Pulmonary;  Laterality: Right;  talc pleurodesis   CHEST TUBE INSERTION Right 09/01/2022   Procedure: INSERTION PLEURAL DRAINAGE CATHETER;  Surgeon: Juanito Doom, MD;  Location: [place];  Service: Cardiopulmonary;  Laterality: Right;  pigtail catheter placement with talc pleurodesis   COLONOSCOPY WITH PROPOFOL N/A 12/17/2018   Procedure: COLONOSCOPY WITH PROPOFOL;  Surgeon: Virgel Manifold, MD;  Location: [facility] ENDOSCOPY;  Service: Gastroenterology;  Laterality: N/A;   IR IMAGING GUIDED PORT INSERTION  04/17/2020   IR THORACENTESIS ASP PLEURAL SPACE W/IMG GUIDE  11/29/2021   IR THORACENTESIS ASP PLEURAL SPACE W/IMG GUIDE  01/03/2022   JOINT REPLACEMENT Bilateral    REPLACEMENT TOTAL KNEE BILATERAL  2015   TALC PLEURODESIS  09/01/2022   Procedure: Pietro Cassis;  Surgeon: Juanito Doom, MD;  Location: [facility] ENDOSCOPY;  Service: Cardiopulmonary;;   [place] ECHOCARDIOGRAM  05/2020   a) 05/2020: EF 55 to 60%.  No R WMA.  Mild LVH.  Indeterminate LVEDP.  Unable to assess RVP.  Normal aortic and mitral valves.  Mildly elevated RAP.; b) 09/2021: EF 50-55%. No RWMA. Mild LVH. ~ LVEDP. Mild LA Dilation. NORMAL RV/RAP.  Normal MV/AoV.   Patient Active Problem List   Diagnosis Date Noted  Hip pain 12/09/2022   Dizziness 11/12/2022   Chronic respiratory failure with hypoxia (HCC) 09/18/2022   Pneumothorax 09/02/2022   Pneumothorax on right 09/01/2022   Recurrent pleural effusion 08/27/2022   Recurrent pleural effusion on right 01/16/2022   Preop cardiovascular exam 12/19/2021   Coronary Calcium Score 657 11/13/2021   Atypical chest pain 11/13/2021   Pleural effusion 10/29/2021   Acute on chronic respiratory failure with hypoxia ([place]) 10/12/2021   Recurrent right pleural  effusion 08/08/2021   Erectile dysfunction 10/26/2020   Port-A-Cath in place 06/11/2020   History of pulmonary embolism 05/15/2020   Small cell lung cancer, right upper lobe ([place]) 04/12/2020   Chronic fatigue 04/12/2020   Encounter for antineoplastic chemotherapy 04/12/2020   Encounter for antineoplastic immunotherapy 04/12/2020   Malignant neoplasm of upper lobe of right lung ([place]) 04/04/2020   Goals of care, counseling/discussion 03/30/2020   Exertional dyspnea 03/26/2020   Hyperlipidemia associated with type 2 diabetes mellitus ([place]) 11/08/2019   Joint swelling 11/16/2018   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   Essential hypertension 09/09/2017   Type 2 diabetes mellitus ([place]) 09/09/2017   GERD (gastroesophageal reflux disease) 09/09/2017   Testosterone deficiency 09/09/2017   OSA (obstructive sleep apnea) 09/09/2017   Chronic bilateral low back pain without sciatica 09/09/2017   H/O knee surgery 04/27/2014   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    PCP: [name] NP   REFERRING PROVIDER: [name] NP   REFERRING DIAG: chronic bilateral low back pain without sciatica   Rationale for Evaluation and Treatment: Rehabilitation  THERAPY DIAG:  Other low back pain  Muscle weakness (generalized)  Difficulty in walking, not elsewhere classified  ONSET DATE: December 2023  SUBJECTIVE:                                                                                                                                                                                           SUBJECTIVE STATEMENT: Patient is a very pleasant 72 year old male presenting to physical therapy for chronic low back pain radiating from Left low back to knee.   PERTINENT HISTORY:  Patient is a very pleasant 72 year old male presenting to physical therapy for chronic low back pain without sciatica. Patient has been seen by this therapist in the past. Patient additionally has relapsed  extensive stage small cell lung cancer and now multiple metastatic liver lesions diagnosed in April 2021 with history of radiotherapy and chemotherapy. PMH includes chickenpox COPD, CAD, HTN, GERD, OSA, type II DM, bilateral TKA. Patient tried figure four and stomach pressing up. Also having vertigo.   PAIN:  Are you having pain? Yes:  NPRS scale: worst pain: 10/10; least amount of pain 4/10 current pain 6-7/10 Pain location: R sided low back pain radiating to back of knee; sometimes feeling like wet pants of adduction. Pain description: radiating to back pain Aggravating factors: getting up,  Relieving factors: laying down in bed, bending over the sink  PRECAUTIONS: Other: back, cancer   WEIGHT BEARING RESTRICTIONS: No  FALLS:  Has patient fallen in last 6 months? No  LIVING ENVIRONMENT: Lives with: lives with their spouse Lives in: House/apartment Stairs: Yes: Internal: flight steps; on right going up and on left going up and External: 5 steps; on right going up and on left going up Has following equipment at home: Single point cane, Environmental consultant - 4 wheeled, Electronics engineer, and Grab bars  OCCUPATION: retired IT trainer  PLOF: Independent  PATIENT GOALS: to have less pain  NEXT MD VISIT: n/a  OBJECTIVE:   DIAGNOSTIC FINDINGS:  MR lumbar 2020 Advanced disc degeneration T12-L1 with mild spinal stenosis   Mild spinal stenosis L2-3   Moderate spinal stenosis L3-4 and L4-5  PATIENT SURVEYS:  FOTO 36  SCREENING FOR RED FLAGS: Bowel or bladder incontinence: No Spinal tumors: No Cauda equina syndrome: No Compression fracture: No Abdominal aneurysm: No  COGNITION: Overall cognitive status: Within functional limits for tasks assessed     SENSATION: Hot/Cold: Impaired R medial knee  MUSCLE LENGTH: Hamstrings: Right 25 deg; Left 40 deg   POSTURE: rounded shoulders, increased thoracic kyphosis, posterior pelvic tilt, and flexed trunk   PALPATION: Excessive muscle guarding  along lumbar paraspinals and piriformis   LUMBAR ROM:   AROM eval  Flexion limited  Extension limited  Right lateral flexion limited  Left lateral flexion limited  Right rotation limited  Left rotation limited   (Blank rows = not tested)    LOWER EXTREMITY MMT:    MMT Right eval Left eval  Hip flexion 2+* 3  Hip extension    Hip abduction 3* 3  Hip adduction 3-* 3  Knee flexion 3-* 3  Knee extension 3 3  Ankle dorsiflexion 4 4  Ankle plantarflexion 4 4   (Blank rows = not tested)  LUMBAR SPECIAL TESTS:  Straight leg raise test: Positive, SI Compression/distraction test: Positive, FABER test: Negative, and scour negative; distraction positive   FUNCTIONAL TESTS:  5 times sit to stand: unable to perform due to pain 10 meter walk test: unable to perform due to pain Sit to stand: CGA due to extreme pain Supine,<>prone; slow, extra time due to pain  GAIT: Distance walked: 3 ft Assistive device utilized: Single point cane Level of assistance: CGA Comments: severe pain with walking  TODAY'S TREATMENT:                                                                                                                              DATE: 01/26/23  HEP: Anterior pelvic tilt 10x Hamstring stretch 30 seconds Lay prone 60 seconds  PATIENT EDUCATION:  Education details: POC, anterior pelvic stretch Person educated: Patient and Spouse Education method: Explanation, Demonstration, Tactile cues, and Verbal cues Education comprehension: verbalized understanding, returned demonstration, verbal cues required, and tactile cues required  HOME EXERCISE PROGRAM: Anterior pelvic tilt 10x Hamstring stretch 30 seconds Lay prone 60 seconds   ASSESSMENT:  CLINICAL IMPRESSION: Patient is a very pleasant 72 y.o. male who was seen today for physical therapy evaluation and treatment for low back pain; primarily on L side . Patient has been seen in the past in this clinic and had  significant anterior tilt however he now presents with severe posterior pelvic tilt; limited hip flexion and hamstring length. Large diastasis recti present and indicative of need for core strengthening in addition to LE strength. Gentle mobilizations performed due to active cancer diagnosis undergoing infusions. Patient will benefit from skilled physical therapy to reduce pain, improve mobility, and return to PLOF.     OBJECTIVE IMPAIRMENTS: Abnormal gait, cardiopulmonary status limiting activity, decreased activity tolerance, decreased balance, decreased endurance, decreased mobility, difficulty walking, decreased ROM, decreased strength, hypomobility, impaired perceived functional ability, increased muscle spasms, impaired flexibility, impaired sensation, improper body mechanics, postural dysfunction, obesity, and pain.   ACTIVITY LIMITATIONS: carrying, lifting, bending, sitting, standing, squatting, sleeping, stairs, transfers, bed mobility, bathing, toileting, dressing, reach over head, hygiene/grooming, and locomotion level  PARTICIPATION LIMITATIONS: meal prep, cleaning, laundry, medication management, personal finances, interpersonal relationship, driving, shopping, community activity, occupation, yard work, and church  PERSONAL FACTORS: Age, Behavior pattern, Education, Restaurant manager, fast food, Past/current experiences, Profession, Sex, Social background, Time since onset of injury/illness/exacerbation, and 3+ comorbidities: chickenpox COPD, CAD, HTN, GERD, OSA, type II DM, bilateral TKA  are also affecting patient's functional outcome.   REHAB POTENTIAL: Good  CLINICAL DECISION MAKING: Evolving/moderate complexity  EVALUATION COMPLEXITY: Moderate   GOALS: Goals reviewed with patient? Yes  SHORT TERM GOALS: Target date: 02/23/2023    Patient will be independent in home exercise program to improve strength/mobility for better functional independence with ADLs. Baseline: 2/12: given to patient Goal  status: INITIAL  2.  .  Patient will report a worst pain of  5/10 on VAS in low back to improve tolerance with ADLs and reduced symptoms with activities. Baseline: 2/12: 10/10 Goal status: INITIAL    LONG TERM GOALS: Target date: 03/23/2023    Patient will increase FOTO score to equal to or greater than 48%    to demonstrate statistically significant improvement in mobility and quality of life.  Baseline: 2/12: 36% Goal status: INITIAL  2.  Patient will report a worst pain of 3/10 on VAS in low back to improve tolerance with ADLs and reduced symptoms with activities.  Baseline: 2/12: 10/10 Goal status: INITIAL  3.  Patient will increase 10 meter walk test to >1.59m/s as to improve gait speed for better community ambulation and to reduce fall risk Baseline: 2/12: unable to ambulate 10 meters Goal status: INITIAL  4.  Patient (> 73 years old) will complete five times sit to stand test in < 15 seconds indicating an increased LE strength and improved balance. Baseline: 2/12: unable to perform due to severe pain Goal status: INITIAL    PLAN:  PT FREQUENCY: 2x/week  PT DURATION: 8 weeks  PLANNED INTERVENTIONS: Therapeutic exercises, Therapeutic activity, Neuromuscular re-education, Balance training, Gait training, Patient/Family education, Self Care, Joint mobilization, Stair training, Vestibular training, Canalith repositioning, Orthotic/Fit training, DME instructions, Electrical stimulation, Spinal manipulation, Spinal mobilization, Cryotherapy, Moist heat, Splintting, Taping, Vasopneumatic device, Traction, Ultrasound, Manual therapy, and  Re-evaluation.  PLAN FOR NEXT SESSION: anterior tilting, reducing muscle tension, core and LE strengthening.    Janna Arch, PT 01/26/2023, 5:17 PM

## 2023-01-23 ENCOUNTER — Ambulatory Visit
Admission: RE | Admit: 2023-01-23 | Discharge: 2023-01-23 | Disposition: A | Discharge status: Home / Self Care | Payer: Medicare Other | Source: Ambulatory Visit | Attending: Radiation Oncology | Admitting: Radiation Oncology

## 2023-01-23 ENCOUNTER — Other Ambulatory Visit: Payer: Self-pay

## 2023-01-23 DIAGNOSIS — C3411 Malignant neoplasm of upper lobe, right bronchus or lung: Secondary | ICD-10-CM | POA: Diagnosis not present

## 2023-01-23 DIAGNOSIS — D649 Anemia, unspecified: Secondary | ICD-10-CM | POA: Diagnosis not present

## 2023-01-23 DIAGNOSIS — C3490 Malignant neoplasm of unspecified part of unspecified bronchus or lung: Secondary | ICD-10-CM | POA: Diagnosis not present

## 2023-01-23 DIAGNOSIS — Z95828 Presence of other vascular implants and grafts: Secondary | ICD-10-CM | POA: Diagnosis not present

## 2023-01-23 LAB — RAD ONC ARIA SESSION SUMMARY
Course Elapsed Days: 9
Plan Fractions Treated to Date: 3
Plan Prescribed Dose Per Fraction: 12 Gy
Plan Total Fractions Prescribed: 4
Plan Total Prescribed Dose: 48 Gy
Reference Point Dosage Given to Date: 48 Gy
Reference Point Session Dosage Given: 12 Gy
Session Number: 4

## 2023-01-23 MED FILL — Dexamethasone Sodium Phosphate Inj 100 MG/10ML: INTRAMUSCULAR | Qty: 1 | Status: AC

## 2023-01-23 MED FILL — Fosaprepitant Dimeglumine For IV Infusion 150 MG (Base Eq): INTRAVENOUS | Qty: 5 | Status: AC

## 2023-01-26 ENCOUNTER — Inpatient Hospital Stay (HOSPITAL_BASED_OUTPATIENT_CLINIC_OR_DEPARTMENT_OTHER): Payer: Medicare Other | Admitting: Internal Medicine

## 2023-01-26 ENCOUNTER — Inpatient Hospital Stay: Payer: Medicare Other

## 2023-01-26 ENCOUNTER — Ambulatory Visit: Payer: Medicare Other | Attending: Primary Care

## 2023-01-26 VITALS — BP 134/76 | HR 89 | Resp 17

## 2023-01-26 VITALS — BP 115/69 | HR 85 | Temp 98.6°F | Resp 18 | Wt 298.7 lb

## 2023-01-26 DIAGNOSIS — C3490 Malignant neoplasm of unspecified part of unspecified bronchus or lung: Secondary | ICD-10-CM | POA: Diagnosis not present

## 2023-01-26 DIAGNOSIS — C3411 Malignant neoplasm of upper lobe, right bronchus or lung: Secondary | ICD-10-CM

## 2023-01-26 DIAGNOSIS — M545 Low back pain, unspecified: Secondary | ICD-10-CM | POA: Diagnosis not present

## 2023-01-26 DIAGNOSIS — M6281 Muscle weakness (generalized): Secondary | ICD-10-CM | POA: Diagnosis not present

## 2023-01-26 DIAGNOSIS — G8929 Other chronic pain: Secondary | ICD-10-CM | POA: Diagnosis not present

## 2023-01-26 DIAGNOSIS — R262 Difficulty in walking, not elsewhere classified: Secondary | ICD-10-CM | POA: Insufficient documentation

## 2023-01-26 DIAGNOSIS — Z95828 Presence of other vascular implants and grafts: Secondary | ICD-10-CM

## 2023-01-26 DIAGNOSIS — D649 Anemia, unspecified: Secondary | ICD-10-CM | POA: Diagnosis not present

## 2023-01-26 DIAGNOSIS — M5459 Other low back pain: Secondary | ICD-10-CM | POA: Diagnosis not present

## 2023-01-26 LAB — CBC WITH DIFFERENTIAL (CANCER CENTER ONLY)
Abs Immature Granulocytes: 0.02 10*3/uL (ref 0.00–0.07)
Basophils Absolute: 0 10*3/uL (ref 0.0–0.1)
Basophils Relative: 1 %
Eosinophils Absolute: 0.1 10*3/uL (ref 0.0–0.5)
Eosinophils Relative: 2 %
HCT: 25 % — ABNORMAL LOW (ref 39.0–52.0)
Hemoglobin: 8.3 g/dL — ABNORMAL LOW (ref 13.0–17.0)
Immature Granulocytes: 1 %
Lymphocytes Relative: 9 %
Lymphs Abs: 0.2 10*3/uL — ABNORMAL LOW (ref 0.7–4.0)
MCH: 32.2 pg (ref 26.0–34.0)
MCHC: 33.2 g/dL (ref 30.0–36.0)
MCV: 96.9 fL (ref 80.0–100.0)
Monocytes Absolute: 0.5 10*3/uL (ref 0.1–1.0)
Monocytes Relative: 18 %
Neutro Abs: 1.8 10*3/uL (ref 1.7–7.7)
Neutrophils Relative %: 69 %
Platelet Count: 106 10*3/uL — ABNORMAL LOW (ref 150–400)
RBC: 2.58 MIL/uL — ABNORMAL LOW (ref 4.22–5.81)
RDW: 16.7 % — ABNORMAL HIGH (ref 11.5–15.5)
WBC Count: 2.6 10*3/uL — ABNORMAL LOW (ref 4.0–10.5)
nRBC: 0 % (ref 0.0–0.2)

## 2023-01-26 LAB — CMP (CANCER CENTER ONLY)
ALT: 13 U/L (ref 0–44)
AST: 16 U/L (ref 15–41)
Albumin: 3.7 g/dL (ref 3.5–5.0)
Alkaline Phosphatase: 61 U/L (ref 38–126)
Anion gap: 8 (ref 5–15)
BUN: 16 mg/dL (ref 8–23)
CO2: 26 mmol/L (ref 22–32)
Calcium: 8 mg/dL — ABNORMAL LOW (ref 8.9–10.3)
Chloride: 104 mmol/L (ref 98–111)
Creatinine: 0.98 mg/dL (ref 0.61–1.24)
GFR, Estimated: >60 mL/min (ref >60)
Glucose, Bld: 174 mg/dL — ABNORMAL HIGH (ref 70–99)
Potassium: 3.8 mmol/L (ref 3.5–5.1)
Sodium: 138 mmol/L (ref 135–145)
Total Bilirubin: 0.3 mg/dL (ref 0.3–1.2)
Total Protein: 6.1 g/dL — ABNORMAL LOW (ref 6.5–8.1)

## 2023-01-26 LAB — SAMPLE TO BLOOD BANK

## 2023-01-26 MED ORDER — SODIUM CHLORIDE 0.9 % IV SOLN
10.0000 mg | Freq: Once | INTRAVENOUS | Status: AC
  Administered 2023-01-26: 10 mg via INTRAVENOUS
  Filled 2023-01-26: qty 10

## 2023-01-26 MED ORDER — SODIUM CHLORIDE 0.9% FLUSH: 10.0000 mL | INTRAVENOUS | Status: DC | PRN

## 2023-01-26 MED ORDER — SODIUM CHLORIDE 0.9 % IV SOLN: Freq: Once | INTRAVENOUS | Status: AC

## 2023-01-26 MED ORDER — HEPARIN SOD (PORK) LOCK FLUSH 100 UNIT/ML IV SOLN: 500.0000 [IU] | Freq: Once | INTRAVENOUS | Status: DC | PRN

## 2023-01-26 MED ORDER — SODIUM CHLORIDE 0.9 % IV SOLN
600.0000 mg | Freq: Once | INTRAVENOUS | Status: AC
  Administered 2023-01-26: 600 mg via INTRAVENOUS
  Filled 2023-01-26: qty 60

## 2023-01-26 MED ORDER — FAMOTIDINE IN NACL 20-0.9 MG/50ML-% IV SOLN
20.0000 mg | Freq: Once | INTRAVENOUS | Status: AC
  Administered 2023-01-26: 20 mg via INTRAVENOUS
  Filled 2023-01-26: qty 50

## 2023-01-26 MED ORDER — SODIUM CHLORIDE 0.9% FLUSH
10.0000 mL | Freq: Once | INTRAVENOUS | Status: AC
  Administered 2023-01-26: 10 mL

## 2023-01-26 MED ORDER — TRILACICLIB DIHYDROCHLORIDE INJECTION 300 MG
240.0000 mg/m2 | Freq: Once | INTRAVENOUS | Status: AC
  Administered 2023-01-26: 600 mg via INTRAVENOUS
  Filled 2023-01-26: qty 40

## 2023-01-26 MED ORDER — SODIUM CHLORIDE 0.9 % IV SOLN
150.0000 mg | Freq: Once | INTRAVENOUS | Status: AC
  Administered 2023-01-26: 150 mg via INTRAVENOUS
  Filled 2023-01-26: qty 150

## 2023-01-26 MED ORDER — SODIUM CHLORIDE 0.9 % IV SOLN
80.0000 mg/m2 | Freq: Once | INTRAVENOUS | Status: AC
  Administered 2023-01-26: 200 mg via INTRAVENOUS
  Filled 2023-01-26: qty 10

## 2023-01-26 MED ORDER — PALONOSETRON HCL INJECTION 0.25 MG/5ML
0.2500 mg | Freq: Once | INTRAVENOUS | Status: AC
  Administered 2023-01-26: 0.25 mg via INTRAVENOUS
  Filled 2023-01-26: qty 5

## 2023-01-26 MED FILL — Dexamethasone Sodium Phosphate Inj 100 MG/10ML: INTRAMUSCULAR | Qty: 1 | Status: AC

## 2023-01-26 NOTE — Progress Notes (Signed)
[facility] Telephone:([phone]) [phone]   Fax:([phone]  OFFICE PROGRESS NOTE  [name], NP [place] [address]  DIAGNOSIS: Relapsed extensive stage (T4, N3, M1c)  small cell lung cancer diagnosed in April 2021 and presented with large right upper lobe/right hilar mass with ipsilateral and contralateral mediastinal and right supraclavicular lymphadenopathy in addition to multiple liver lesions. He has disease progression after the first line of chemotherapy in December 2021.  PRIOR THERAPY: 1) Palliative radiotherapy to the right upper lobe lung mass under the care of Dr. Lisbeth Renshaw. 2) Systemic chemotherapy with carboplatin for AUC of 5 on day 1, etoposide 100 mg/M2 on days 1, 2 and 3 in addition to Imfinzi 1500 mg IV every 3 weeks with chemotherapy then every 4 weeks for maintenance if the patient has no evidence for progression.  He will also receive Cosela 240 mg/m2 on the days of the chemotherapy.  Status post 9 cycles.  Starting from cycle #5 the patient will be on maintenance treatment with immunotherapy with Imfinzi 1500 mg IV every 4 weeks. Last dose of chemotherapy was given on November 13, 2020. This treatment was discontinued secondary to disease progression 3) Systemic chemotherapy with carboplatin for AUC of 5 on day 1, etoposide 100 mg/M2 on days 1, 2 and 3, Tecentriq 1200 mg IV every 3 weeks as well as Cosela 250 mg/M2 on the days of the chemotherapy every 3 weeks.  First dose December 18, 2020.  Status post 8 cycles. 4) Zepzelca (lurbinectedin) 3.2 mgm2 IV every 3 weeks. Last dose on 01/23/22. Status post 12 cycles.  5) Palliative systemic chemotherapy with irinotecan 65 mg/m2 on days 1 and 8 IV every 3 weeks.  Status post 3 cycles.  Last dose was given April 01, 2022 discontinued secondary to disease progression. 6) SBRT to the progressive liver lesions under the care of Dr. Lisbeth Renshaw.  Last fraction January 27, 2023.   CURRENT THERAPY:  systemic chemotherapy with carboplatin for AUC of 5 on day 1, etoposide 100 Mg/M2 on days 1, 2 and 3 with Cosela before the chemotherapy.  First dose expected to start on 05/28/2022.  Status post 11 cycles.  Starting from cycle #5 his carboplatin will be reduced to AUC of 4 and 2 etoposide 80 Mg/M2.  INTERVAL HISTORY: Adam Wise 72 y.o. male returns to the clinic today for follow-up visit accompanied by his wife.  The patient is feeling fine today with no concerning complaints except for the mild fatigue and shortness of breath with exertion.  He has been tolerating his treatment with systemic chemotherapy fairly well.  He is also undergoing palliative radiotherapy to the progressive disease in the liver under the care of Dr. Lisbeth Renshaw and expected to complete this treatment tomorrow.  He denied having any current chest pain but has shortness of breath at baseline increased with exertion with no cough or hemoptysis.  He continues to have low back pain.  He denied having any nausea, vomiting, diarrhea or constipation.  He has no headache or visual changes.  He is here today for evaluation before starting cycle #12 of his treatment.   MEDICAL HISTORY: Past Medical History:  Diagnosis Date   Chickenpox    Chronic knee pain    Chronic low back pain    COPD exacerbation ([facility]) 10/11/2021   COPD with exacerbation ([facility]) 10/12/2021   Coronary artery calcification seen on CAT scan 11/2021   Coronary CTA 11/27/2021: Moderate to large  right pleural effusion and compressive atelectasis right lung base. => Coronary Calcium Score 657.  Diffuse RCA calcification.  Minimal mild disease in the LAD and diagonal branches. == Overall limited study.  Notable artifact.   Essential hypertension    GERD (gastroesophageal reflux disease)    Hyperlipidemia    Malignant neoplasm of upper lobe of right lung ([facility]) 04/04/2020   OSA (obstructive sleep apnea)    With nighttime oxygen supplementation   T4, M3, M1 C Metastatic Small  Cell Lung Cancer 03/2020   large right upper lobe/right hilar mass with ipsilateral and contralateral mediastinal and right supraclavicular lymphadenopathy in addition to multiple liver lesios. He has disease progression after the first line of chemotherapy in December 2021.   Testosterone deficiency    Type 2 diabetes mellitus (HCC)     ALLERGIES:  is allergic to bupropion and hydrochlorothiazide.  MEDICATIONS:  Current Outpatient Medications  Medication Sig Dispense Refill   albuterol (VENTOLIN HFA) 108 (90 Base) MCG/ACT inhaler Inhale 2 puffs into the lungs every 6 (six) hours as needed for wheezing or shortness of breath. 18 g 0   amLODipine (NORVASC) 10 MG tablet TAKE 1 TABLET BY MOUTH EVERY DAY FOR BLOOD PRESSURE 90 tablet 1   B-D 3CC LUER-LOK SYR 22GX1" 22G X 1" 3 ML MISC USE AS INSTRUCTED FOR TESTOSTERONE INJECTION EVERY 2 WEEKS 10 each 2   bisoprolol (ZEBETA) 5 MG tablet TAKE 1 TABLET (5 MG TOTAL) BY MOUTH DAILY. 90 tablet 2   Black Pepper-Turmeric (TURMERIC COMPLEX/BLACK PEPPER PO) Take 1 tablet by mouth in the morning and at bedtime.     blood glucose meter kit and supplies KIT Dispense based on patient and insurance preference. Use up to four times daily as directed. 1 each 0   Budeson-Glycopyrrol-Formoterol (BREZTRI AEROSPHERE) 160-9-4.8 MCG/ACT AERO Inhale 2 puffs into the lungs in the morning and at bedtime. 10.7 g 3   chlorpheniramine-HYDROcodone (TUSSIONEX) 10-8 MG/5ML Take 5 mLs by mouth every 12 (twelve) hours as needed for cough. 473 mL 0   Coenzyme Q10 (COQ10) 200 MG CAPS Take 200 mg by mouth at bedtime.     ELIQUIS 5 MG TABS tablet TAKE 1 TABLET BY MOUTH TWICE A DAY 180 tablet 2   esomeprazole (NEXIUM) 20 MG capsule Take 20 mg by mouth in the morning.     gabapentin (NEURONTIN) 300 MG capsule TAKE 1 CAPSULE (300 MG TOTAL) BY MOUTH 2 (TWO) TIMES DAILY. FOR BACK PAIN. 180 capsule 2   glipiZIDE (GLUCOTROL) 10 MG tablet TAKE 1 TABLET (10 MG TOTAL) BY MOUTH 2 (TWO) TIMES DAILY  BEFORE A MEAL. FOR DIABETES. 180 tablet 1   glucose blood (ONETOUCH ULTRA) test strip USE UP TO 4 TIMES DAILY AS DIRECTED 400 strip 5   Insulin Glargine (BASAGLAR KWIKPEN) 100 UNIT/ML Inject 24 Units into the skin at bedtime. For diabetes. 30 mL 1   Insulin Pen Needle (BD PEN NEEDLE NANO U/F) 32G X 4 MM MISC Use with insulin as prescribed Dx Code: E11.9 200 each 3   ipratropium-albuterol (DUONEB) 0.5-2.5 (3) MG/3ML SOLN Take 3 mLs by nebulization every 4 (four) hours while awake for 3 days, THEN 3 mLs every 4 (four) hours as needed (shortness of breath or wheezing). 360 mL 0   KRILL OIL PO Take 1 capsule by mouth daily.     Lancets MISC USE UP TO 3 TIMES DAILY AS DIRECTED 100 each 2   losartan (COZAAR) 50 MG tablet TAKE 1 TABLET (50 MG TOTAL) BY  MOUTH DAILY. FOR BLOOD PRESSURE. 90 tablet 2   Melatonin 10 MG CAPS Take 10 mg by mouth at bedtime.     metFORMIN (GLUCOPHAGE) 1000 MG tablet TAKE 1 TABLET (1,000 MG TOTAL) BY MOUTH 2 (TWO) TIMES DAILY WITH A MEAL. FOR DIABETES 180 tablet 1   Multiple Vitamin (MULTI-VITAMINS) TABS Take 1 tablet by mouth in the morning.     oxymetazoline (AFRIN) 0.05 % nasal spray Place 1 spray into both nostrils daily as needed for congestion.     pioglitazone (ACTOS) 45 MG tablet TAKE 1 TABLET (45 MG TOTAL) BY MOUTH DAILY. FOR DIABETES. 90 tablet 1   pravastatin (PRAVACHOL) 40 MG tablet TAKE 1 TABLET BY MOUTH EVERY DAY IN THE EVENING FOR CHOLESTEROL 90 tablet 2   testosterone cypionate (DEPOTESTOSTERONE CYPIONATE) 200 MG/ML injection Inject 200 mg into the muscle every 14 (fourteen) days.     traMADol (ULTRAM) 50 MG tablet Take 1 tablet (50 mg total) by mouth at bedtime as needed for severe pain. 30 tablet 0   No current facility-administered medications for this visit.    SURGICAL HISTORY:  Past Surgical History:  Procedure Laterality Date   CHEST TUBE INSERTION Right 01/20/2022   Procedure: CHEST TUBE INSERTION;  Surgeon: Candee Furbish, MD;  Location: [facility] ENDOSCOPY;   Service: Pulmonary;  Laterality: Right;  w/ Talc Pleurodesis, planned admit for Obs afterwards   CHEST TUBE INSERTION Right 04/28/2022   Procedure: INSERTION PLEURAL DRAINAGE CATHETER - Pigtail tail, drainage;  Surgeon: Candee Furbish, MD;  Location: [facility] ENDOSCOPY;  Service: Pulmonary;  Laterality: Right;  talc pleurodesis   CHEST TUBE INSERTION Right 09/01/2022   Procedure: INSERTION PLEURAL DRAINAGE CATHETER;  Surgeon: Juanito Doom, MD;  Location: [place];  Service: Cardiopulmonary;  Laterality: Right;  pigtail catheter placement with talc pleurodesis   COLONOSCOPY WITH PROPOFOL N/A 12/17/2018   Procedure: COLONOSCOPY WITH PROPOFOL;  Surgeon: Virgel Manifold, MD;  Location: [facility] ENDOSCOPY;  Service: Gastroenterology;  Laterality: N/A;   IR IMAGING GUIDED PORT INSERTION  04/17/2020   IR THORACENTESIS ASP PLEURAL SPACE W/IMG GUIDE  11/29/2021   IR THORACENTESIS ASP PLEURAL SPACE W/IMG GUIDE  01/03/2022   JOINT REPLACEMENT Bilateral    REPLACEMENT TOTAL KNEE BILATERAL  2015   TALC PLEURODESIS  09/01/2022   Procedure: Pietro Cassis;  Surgeon: Juanito Doom, MD;  Location: [facility] ENDOSCOPY;  Service: Cardiopulmonary;;   [place] ECHOCARDIOGRAM  05/2020   a) 05/2020: EF 55 to 60%.  No R WMA.  Mild LVH.  Indeterminate LVEDP.  Unable to assess RVP.  Normal aortic and mitral valves.  Mildly elevated RAP.; b) 09/2021: EF 50-55%. No RWMA. Mild LVH. ~ LVEDP. Mild LA Dilation. NORMAL RV/RAP.  Normal MV/AoV.    REVIEW OF SYSTEMS:  Constitutional: positive for fatigue Eyes: negative Ears, nose, mouth, throat, and face: negative Respiratory: positive for dyspnea on exertion Cardiovascular: negative Gastrointestinal: negative Genitourinary:negative Integument/breast: negative Hematologic/lymphatic: negative Musculoskeletal:positive for back pain Neurological: negative Behavioral/Psych: negative Endocrine: negative Allergic/Immunologic: negative   PHYSICAL  EXAMINATION: General appearance: alert, cooperative, fatigued, and no distress Head: Normocephalic, without obvious abnormality, atraumatic Neck: no adenopathy, no JVD, supple, symmetrical, trachea midline, and thyroid not enlarged, symmetric, no tenderness/mass/nodules Lymph nodes: Cervical, supraclavicular, and axillary nodes normal. Resp: clear to auscultation bilaterally Back: symmetric, no curvature. ROM normal. No CVA tenderness. Cardio: regular rate and rhythm, S1, S2 normal, no murmur, click, rub or gallop GI: soft, non-tender; bowel sounds normal; no masses,  no organomegaly  Extremities: extremities normal, atraumatic, no cyanosis or edema Neurologic: Alert and oriented X 3, normal strength and tone. Normal symmetric reflexes. Normal coordination and gait  ECOG PERFORMANCE STATUS: 1 - Symptomatic but completely ambulatory  Blood pressure 115/69, pulse 85, temperature 98.6 F (37 C), temperature source Oral, resp. rate 18, weight 298 lb 11.2 oz (135.5 kg), SpO2 93 %.   LABORATORY DATA: Lab Results  Component Value Date   WBC 2.6 (L) 01/26/2023   HGB 8.3 (L) 01/26/2023   HCT 25.0 (L) 01/26/2023   MCV 96.9 01/26/2023   PLT 106 (L) 01/26/2023      Chemistry      Component Value Date/Time   NA 138 01/19/2023 1020   K 4.0 01/19/2023 1020   CL 102 01/19/2023 1020   CO2 27 01/19/2023 1020   BUN 20 01/19/2023 1020   CREATININE 1.01 01/19/2023 1020   CREATININE 1.45 (H) 10/18/2021 1533      Component Value Date/Time   CALCIUM 8.1 (L) 01/19/2023 1020   ALKPHOS 65 01/19/2023 1020   AST 16 01/19/2023 1020   ALT 16 01/19/2023 1020   BILITOT 0.4 01/19/2023 1020       RADIOGRAPHIC STUDIES:    ASSESSMENT AND PLAN: This is a very pleasant 72 year old Caucasian male diagnosed with extensive stage (T4, N3, M1C) small cell lung cancer presented with large right upper lobe lung mass in addition to mediastinal and right supraclavicular lymphadenopathy and multiple metastatic liver  lesions diagnosed in April 2021. The patient initially underwent systemic chemotherapy with carboplatin for an AUC of 5 on day 1, etoposide 100 mg per metered squared on days 1, 2, and 3 in addition to [facility] for myeloprotection.  He also received immunotherapy with Imfinzi on day 1 of every chemotherapy cycle.  He is status post 9 cycles.  Starting from cycle #5 he was on single agent immunotherapy with Imfinzi IV every 4 weeks.  The patient had evidence of disease progression on his scan from December 2021.  He was then started on systemic chemotherapy with carboplatin for an AUC of 5 on day 1, etoposide 100 mg per metered squared on days 1, 2, and 3 in addition to Tecentriq 1200 mg on day 1, the patient is status post 9 cycles.  Starting from cycle #5, the patient started maintenance immunotherapy with Tecentriq.  This was discontinued due to evidence of disease progression. The patient started third line treatment with Zepzelca (lurbinectedin) 3.2 Mg/M2 every 3-week status post 12 cycles of this treatment and he has been tolerating it fairly well except for fatigue.  This treatment was discontinued secondary to disease progression. He underwent fourth line treatment with systemic chemotherapy with irinotecan 65 Mg/M2 on days 1 and 8 every 3 weeks.  Status post 3 cycles.  He tolerated this treatment well with no concerning adverse effect except for mild fatigue. Unfortunately his scan showed evidence for disease progression especially in the liver. I recommended for the patient to discontinue his current treatment with irinotecan at this point. He started systemic chemotherapy again with carboplatin for AUC of 5 on day 1, etoposide 100 Mg/M2 on days 1, 2 and 3 with Cosela status post 11 cycles. I did reduce the dose of carboplatin to AUC of 4 and etoposide 80 Mg/M2 starting from cycle #5.  The patient has been tolerating this treatment fairly well with no concerning adverse effect except for mild  fatigue. I recommended for the patient to proceed with cycle #12 today as planned.  I advised the patient about the risk of continuing treatment with carboplatin including hypersensitivity reaction and also the possibility of myelodysplastic syndrome as well as acute leukemia with the prolonged treatment with etoposide.  The patient understands these risks and he would like to continue with his treatment as planned. The patient was seen by Dr. Lennart Pall at [facility] for consideration of clinical trial but none is available for him at this point.  Dr. Mariel Kansky recommended for him to continue on the current systemic chemotherapy and palliative radiation. I will see him back for follow-up visit in 3 weeks for evaluation with repeat CT scan of the chest, abdomen and pelvis for restaging of his disease. The patient was advised to call immediately if he has any concerning symptoms in the interval. The patient voices understanding of current disease status and treatment options and is in agreement with the current care plan.  All questions were answered. The patient knows to call the clinic with any problems, questions or concerns. We can certainly see the patient much sooner if necessary.  The total time spent in the appointment was 30 minutes.  Disclaimer: This note was dictated with voice recognition software. Similar sounding words can inadvertently be transcribed and may not be corrected upon review. Eilleen Kempf, MD 01/26/23

## 2023-01-26 NOTE — Patient Instructions (Signed)
[facility] [place]  Discharge Instructions: Thank you for choosing [facility] to provide your oncology and hematology care.   If you have a lab appointment with the [facility], please go directly to the [facility] and check in at the registration area.   Wear comfortable clothing and clothing appropriate for easy access to any Portacath or PICC line.   We strive to give you quality time with your provider. You may need to reschedule your appointment if you arrive late (15 or more minutes).  Arriving late affects you and other patients whose appointments are after yours.  Also, if you miss three or more appointments without notifying the office, you may be dismissed from the clinic at the provider's discretion.      For prescription refill requests, have your pharmacy contact our office and allow 72 hours for refills to be completed.    Today you received the following chemotherapy and/or immunotherapy agents: carboplatin and etoposide      To help prevent nausea and vomiting after your treatment, we encourage you to take your nausea medication as directed.  BELOW ARE SYMPTOMS THAT SHOULD BE REPORTED IMMEDIATELY: *FEVER GREATER THAN 100.4 F (38 C) OR HIGHER *CHILLS OR SWEATING *NAUSEA AND VOMITING THAT IS NOT CONTROLLED WITH YOUR NAUSEA MEDICATION *UNUSUAL SHORTNESS OF BREATH *UNUSUAL BRUISING OR BLEEDING *URINARY PROBLEMS (pain or burning when urinating, or frequent urination) *BOWEL PROBLEMS (unusual diarrhea, constipation, pain near the anus) TENDERNESS IN MOUTH AND THROAT WITH OR WITHOUT PRESENCE OF ULCERS (sore throat, sores in mouth, or a toothache) UNUSUAL RASH, SWELLING OR PAIN  UNUSUAL VAGINAL DISCHARGE OR ITCHING   Items with * indicate a potential emergency and should be followed up as soon as possible or go to the Emergency Department if any problems should occur.  Please show the CHEMOTHERAPY ALERT CARD or IMMUNOTHERAPY  ALERT CARD at check-in to the Emergency Department and triage nurse.  Should you have questions after your visit or need to cancel or reschedule your appointment, please contact [facility]  Dept: [phone]  and follow the prompts.  Office hours are 8:00 a.m. to 4:30 p.m. Monday - Friday. Please note that voicemails left after 4:00 p.m. may not be returned until the following business day.  We are closed weekends and major holidays. You have access to a nurse at all times for urgent questions. Please call the main number to the clinic Dept: [phone] and follow the prompts.   For any non-urgent questions, you may also contact your provider using MyChart. We now offer e-Visits for anyone 43 and older to request care online for non-urgent symptoms. For details visit [email or web address].   Also download the MyChart app! Go to the app store, search "MyChart", open the app, select [facility], and log in with your MyChart username and password.

## 2023-01-27 ENCOUNTER — Encounter: Payer: Self-pay | Admitting: Radiation Oncology

## 2023-01-27 ENCOUNTER — Ambulatory Visit
Admission: RE | Admit: 2023-01-27 | Discharge: 2023-01-27 | Disposition: A | Discharge status: Home / Self Care | Payer: Medicare Other | Source: Ambulatory Visit | Attending: Radiation Oncology | Admitting: Radiation Oncology

## 2023-01-27 ENCOUNTER — Telehealth: Payer: Self-pay | Admitting: Internal Medicine

## 2023-01-27 ENCOUNTER — Other Ambulatory Visit: Payer: Self-pay

## 2023-01-27 ENCOUNTER — Inpatient Hospital Stay: Payer: Medicare Other

## 2023-01-27 VITALS — BP 134/78 | HR 78 | Temp 98.2°F | Resp 18

## 2023-01-27 DIAGNOSIS — C3411 Malignant neoplasm of upper lobe, right bronchus or lung: Secondary | ICD-10-CM

## 2023-01-27 DIAGNOSIS — Z87891 Personal history of nicotine dependence: Secondary | ICD-10-CM | POA: Diagnosis not present

## 2023-01-27 DIAGNOSIS — D649 Anemia, unspecified: Secondary | ICD-10-CM | POA: Diagnosis not present

## 2023-01-27 DIAGNOSIS — C3490 Malignant neoplasm of unspecified part of unspecified bronchus or lung: Secondary | ICD-10-CM | POA: Diagnosis not present

## 2023-01-27 DIAGNOSIS — Z95828 Presence of other vascular implants and grafts: Secondary | ICD-10-CM | POA: Diagnosis not present

## 2023-01-27 DIAGNOSIS — Z510 Encounter for antineoplastic radiation therapy: Secondary | ICD-10-CM | POA: Diagnosis not present

## 2023-01-27 LAB — RAD ONC ARIA SESSION SUMMARY
Course Elapsed Days: 13
Plan Fractions Treated to Date: 4
Plan Prescribed Dose Per Fraction: 12 Gy
Plan Total Fractions Prescribed: 4
Plan Total Prescribed Dose: 48 Gy
Reference Point Dosage Given to Date: 60 Gy
Reference Point Session Dosage Given: 12 Gy
Session Number: 5

## 2023-01-27 MED ORDER — SODIUM CHLORIDE 0.9 % IV SOLN: Freq: Once | INTRAVENOUS | Status: AC

## 2023-01-27 MED ORDER — TRILACICLIB DIHYDROCHLORIDE INJECTION 300 MG
240.0000 mg/m2 | Freq: Once | INTRAVENOUS | Status: AC
  Administered 2023-01-27: 600 mg via INTRAVENOUS
  Filled 2023-01-27: qty 40

## 2023-01-27 MED ORDER — SODIUM CHLORIDE 0.9% FLUSH
10.0000 mL | INTRAVENOUS | Status: DC | PRN
  Administered 2023-01-27: 10 mL

## 2023-01-27 MED ORDER — SODIUM CHLORIDE 0.9 % IV SOLN
10.0000 mg | Freq: Once | INTRAVENOUS | Status: AC
  Administered 2023-01-27: 10 mg via INTRAVENOUS
  Filled 2023-01-27: qty 10

## 2023-01-27 MED ORDER — HEPARIN SOD (PORK) LOCK FLUSH 100 UNIT/ML IV SOLN
500.0000 [IU] | Freq: Once | INTRAVENOUS | Status: AC | PRN
  Administered 2023-01-27: 500 [IU]

## 2023-01-27 MED ORDER — SODIUM CHLORIDE 0.9 % IV SOLN
80.0000 mg/m2 | Freq: Once | INTRAVENOUS | Status: AC
  Administered 2023-01-27: 200 mg via INTRAVENOUS
  Filled 2023-01-27: qty 10

## 2023-01-27 MED FILL — Dexamethasone Sodium Phosphate Inj 100 MG/10ML: INTRAMUSCULAR | Qty: 1 | Status: AC

## 2023-01-27 NOTE — Telephone Encounter (Signed)
Called patient regarding upcoming appointments, patient is notified. 

## 2023-01-28 ENCOUNTER — Other Ambulatory Visit: Payer: Self-pay | Admitting: Radiation Therapy

## 2023-01-28 ENCOUNTER — Telehealth: Payer: Self-pay | Admitting: Radiation Therapy

## 2023-01-28 ENCOUNTER — Inpatient Hospital Stay: Payer: Medicare Other

## 2023-01-28 VITALS — BP 141/80 | HR 85 | Temp 98.3°F | Resp 20

## 2023-01-28 DIAGNOSIS — Z95828 Presence of other vascular implants and grafts: Secondary | ICD-10-CM | POA: Diagnosis not present

## 2023-01-28 DIAGNOSIS — C3490 Malignant neoplasm of unspecified part of unspecified bronchus or lung: Secondary | ICD-10-CM

## 2023-01-28 DIAGNOSIS — D649 Anemia, unspecified: Secondary | ICD-10-CM | POA: Diagnosis not present

## 2023-01-28 DIAGNOSIS — C3411 Malignant neoplasm of upper lobe, right bronchus or lung: Secondary | ICD-10-CM

## 2023-01-28 MED ORDER — TRILACICLIB DIHYDROCHLORIDE INJECTION 300 MG
240.0000 mg/m2 | Freq: Once | INTRAVENOUS | Status: AC
  Administered 2023-01-28: 600 mg via INTRAVENOUS
  Filled 2023-01-28: qty 40

## 2023-01-28 MED ORDER — SODIUM CHLORIDE 0.9 % IV SOLN
10.0000 mg | Freq: Once | INTRAVENOUS | Status: AC
  Administered 2023-01-28: 10 mg via INTRAVENOUS
  Filled 2023-01-28: qty 10

## 2023-01-28 MED ORDER — SODIUM CHLORIDE 0.9% FLUSH
10.0000 mL | INTRAVENOUS | Status: DC | PRN
  Administered 2023-01-28: 10 mL

## 2023-01-28 MED ORDER — HEPARIN SOD (PORK) LOCK FLUSH 100 UNIT/ML IV SOLN
500.0000 [IU] | Freq: Once | INTRAVENOUS | Status: AC | PRN
  Administered 2023-01-28: 500 [IU]

## 2023-01-28 MED ORDER — SODIUM CHLORIDE 0.9 % IV SOLN
80.0000 mg/m2 | Freq: Once | INTRAVENOUS | Status: AC
  Administered 2023-01-28: 200 mg via INTRAVENOUS
  Filled 2023-01-28: qty 10

## 2023-01-28 MED ORDER — SODIUM CHLORIDE 0.9 % IV SOLN: Freq: Once | INTRAVENOUS | Status: AC

## 2023-01-28 NOTE — Telephone Encounter (Signed)
I spoke with Mr. Cruey about the request to get him scheduled for a brain MRI now and then to get him plugged into our brain and spine program for future follow-up and surveillance. He is aware of the appointment scheduled on 2/23 at [facility] and thankful for the call.   Mont Dutton R.T.(R)(T) Radiation Special Procedures Navigator

## 2023-01-28 NOTE — Progress Notes (Signed)
                                                                                                                                                             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    Intent: Curative  Radiation Treatment Dates:  01/14/2023 through 01/27/2023 SBRT Treatment  Site Technique Total Dose (Gy) Dose per Fx (Gy) Completed Fx Beam Energies  Liver: Liver Three targets in one isocenter IMRT 60/60 12 5/5 6XFFF   Narrative: The patient tolerated radiation therapy relatively well.    Plan: The patient will receive a call in about one month from the radiation oncology department. He will continue follow up with Dr. Julien Nordmann as well as Dr. Mariel Kansky at [facility]. He has also expressed desire to be followed in the brain oncology program in surveillance since he has declined prophylactic cranial irradiation. We will coordinate MRI for the patient since his last was in October 2023.  ________________________________________________    Carola Rhine, [facility]

## 2023-01-28 NOTE — Patient Instructions (Signed)
[place]  Discharge Instructions: Thank you for choosing [facility] to provide your oncology and hematology care.   If you have a lab appointment with the [facility], please go directly to the [facility] and check in at the registration area.   Wear comfortable clothing and clothing appropriate for easy access to any Portacath or PICC line.   We strive to give you quality time with your provider. You may need to reschedule your appointment if you arrive late (15 or more minutes).  Arriving late affects you and other patients whose appointments are after yours.  Also, if you miss three or more appointments without notifying the office, you may be dismissed from the clinic at the provider's discretion.      For prescription refill requests, have your pharmacy contact our office and allow 72 hours for refills to be completed.    Today you received the following chemotherapy and/or immunotherapy agents: carboplatin and etoposide      To help prevent nausea and vomiting after your treatment, we encourage you to take your nausea medication as directed.  BELOW ARE SYMPTOMS THAT SHOULD BE REPORTED IMMEDIATELY: *FEVER GREATER THAN 100.4 F (38 C) OR HIGHER *CHILLS OR SWEATING *NAUSEA AND VOMITING THAT IS NOT CONTROLLED WITH YOUR NAUSEA MEDICATION *UNUSUAL SHORTNESS OF BREATH *UNUSUAL BRUISING OR BLEEDING *URINARY PROBLEMS (pain or burning when urinating, or frequent urination) *BOWEL PROBLEMS (unusual diarrhea, constipation, pain near the anus) TENDERNESS IN MOUTH AND THROAT WITH OR WITHOUT PRESENCE OF ULCERS (sore throat, sores in mouth, or a toothache) UNUSUAL RASH, SWELLING OR PAIN  UNUSUAL VAGINAL DISCHARGE OR ITCHING   Items with * indicate a potential emergency and should be followed up as soon as possible or go to the Emergency Department if any problems should occur.  Please show the CHEMOTHERAPY ALERT CARD or IMMUNOTHERAPY  ALERT CARD at check-in to the Emergency Department and triage nurse.  Should you have questions after your visit or need to cancel or reschedule your appointment, please contact [facility]  Dept: [phone]  and follow the prompts.  Office hours are 8:00 a.m. to 4:30 p.m. Monday - Friday. Please note that voicemails left after 4:00 p.m. may not be returned until the following business day.  We are closed weekends and major holidays. You have access to a nurse at all times for urgent questions. Please call the main number to the clinic Dept: [phone] and follow the prompts.   For any non-urgent questions, you may also contact your provider using MyChart. We now offer e-Visits for anyone 6 and older to request care online for non-urgent symptoms. For details visit [email or web address].   Also download the MyChart app! Go to the app store, search "MyChart", open the app, select [facility], and log in with your MyChart username and password.

## 2023-01-28 NOTE — Therapy (Signed)
OUTPATIENT PHYSICAL THERAPY THORACOLUMBAR TREATMENT   Patient Name: Adam Wise MRN: [id number] DOB:06-24-1951, 72 y.o., male Today's Date: 01/29/2023  END OF SESSION:  PT End of Session - 01/29/23 1245     Visit Number 2    Number of Visits 16    Date for PT Re-Evaluation 03/23/23    Authorization Type 2/10 eval 01/26/23    PT Start Time 1259    PT Stop Time 1344    PT Time Calculation (min) 45 min    Activity Tolerance Patient limited by pain    Behavior During Therapy [facility] for tasks assessed/performed              Past Medical History:  Diagnosis Date   Chickenpox    Chronic knee pain    Chronic low back pain    COPD exacerbation (HCC) 10/11/2021   COPD with exacerbation (HCC) 10/12/2021   Coronary artery calcification seen on CAT scan 11/2021   Coronary CTA 11/27/2021: Moderate to large right pleural effusion and compressive atelectasis right lung base. => Coronary Calcium Score 657.  Diffuse RCA calcification.  Minimal mild disease in the LAD and diagonal branches. == Overall limited study.  Notable artifact.   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 He has disease progression after the first line of chemotherapy in December 2021.   Testosterone deficiency    Type 2 diabetes mellitus (HCC)    Past Surgical History:  Procedure Laterality Date   CHEST TUBE INSERTION Right 01/20/2022   Procedure: CHEST TUBE INSERTION;  Surgeon: Lorin Glass, MD;  Location: [facility] ENDOSCOPY;  Service: Pulmonary;  Laterality: Right;  w/ Talc Pleurodesis, planned admit for Obs afterwards    CHEST TUBE INSERTION Right 04/28/2022   Procedure: INSERTION PLEURAL DRAINAGE CATHETER - Pigtail tail, drainage;  Surgeon: Lorin Glass, MD;  Location: [facility] ENDOSCOPY;  Service: Pulmonary;  Laterality: Right;  talc pleurodesis   CHEST TUBE INSERTION Right 09/01/2022   Procedure: INSERTION PLEURAL DRAINAGE CATHETER;  Surgeon: Lupita Leash, MD;  Location: [facility] ENDOSCOPY;  Service: Cardiopulmonary;  Laterality: Right;  pigtail catheter placement with talc pleurodesis   COLONOSCOPY WITH PROPOFOL N/A 12/17/2018   Procedure: COLONOSCOPY WITH PROPOFOL;  Surgeon: Pasty Spillers, MD;  Location: [facility] ENDOSCOPY;  Service: Gastroenterology;  Laterality: N/A;   IR IMAGING GUIDED PORT INSERTION  04/17/2020   IR THORACENTESIS ASP PLEURAL SPACE W/IMG GUIDE  11/29/2021   IR THORACENTESIS ASP PLEURAL SPACE W/IMG GUIDE  01/03/2022   JOINT REPLACEMENT Bilateral    REPLACEMENT TOTAL KNEE BILATERAL  2015   TALC PLEURODESIS  09/01/2022   Procedure: Lurlean Nanny;  Surgeon: Lupita Leash, MD;  Location: [facility] ENDOSCOPY;  Service: Cardiopulmonary;;   TONSILLECTOMY  1960   TRANSTHORACIC ECHOCARDIOGRAM  05/2020   a) 05/2020: EF 55 to 60%.  No R WMA.  Mild LVH.  Indeterminate LVEDP.  Unable to assess RVP.  Normal aortic and mitral valves.  Mildly elevated RAP.; b) 09/2021: EF 50-55%. No RWMA. Mild LVH. ~ LVEDP. Mild LA Dilation. NORMAL RV/RAP.  Normal MV/AoV.   Patient Active Problem List   Diagnosis Date  Noted   Hip pain 12/09/2022   Dizziness 11/12/2022   Chronic respiratory failure with hypoxia (HCC) 09/18/2022   Pneumothorax 09/02/2022   Pneumothorax on right 09/01/2022   Recurrent pleural effusion 08/27/2022   Recurrent pleural effusion on right 01/16/2022   Preop cardiovascular exam 12/19/2021   Coronary Calcium Score 657 11/13/2021   Atypical chest pain 11/13/2021   Pleural effusion 10/29/2021   Acute on chronic respiratory failure with hypoxia (HCC) 10/12/2021   Recurrent right pleural  effusion 08/08/2021   Erectile dysfunction 10/26/2020   Port-A-Cath in place 06/11/2020   History of pulmonary embolism 05/15/2020   Small cell lung cancer, right upper lobe (HCC) 04/12/2020   Chronic fatigue 04/12/2020   Encounter for antineoplastic chemotherapy 04/12/2020   Encounter for antineoplastic immunotherapy 04/12/2020   Malignant neoplasm of upper lobe of right lung (HCC) 04/04/2020   Goals of care, counseling/discussion 03/30/2020   Exertional dyspnea 03/26/2020   Hyperlipidemia associated with type 2 diabetes mellitus (HCC) 11/08/2019   Joint swelling 11/16/2018   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   Essential hypertension 09/09/2017   Type 2 diabetes mellitus (HCC) 09/09/2017   GERD (gastroesophageal reflux disease) 09/09/2017   Testosterone deficiency 09/09/2017   OSA (obstructive sleep apnea) 09/09/2017   Chronic bilateral low back pain without sciatica 09/09/2017   H/O knee surgery 04/27/2014   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 Had his last infusion for a two week break.   PERTINENT HISTORY:  Patient is a very pleasant 72 year old male presenting to physical therapy for chronic low back pain without sciatica. Patient has been seen by this therapist in the past. Patient additionally has relapsed extensive stage small cell lung cancer and now multiple metastatic  liver lesions diagnosed in April 2021 with history of radiotherapy and chemotherapy. PMH includes chickenpox COPD, CAD, HTN, GERD, OSA, type II DM, bilateral TKA. Patient tried figure four and stomach pressing up. Also having vertigo.   PAIN:  Are you having pain? 7/10 pain Yes: NPRS scale: worst pain: 10/10; least  amount of pain 4/10 current pain 6-7/10 Pain location: R sided low back pain radiating to back of knee; sometimes feeling like wet pants of adduction. Pain description: radiating to back pain Aggravating factors: getting up,  Relieving factors: laying down in bed, bending over the sink  PRECAUTIONS: Other: back, cancer   WEIGHT BEARING RESTRICTIONS: No  FALLS:  Has patient fallen in last 6 months? No  LIVING ENVIRONMENT: Lives with: lives with their spouse Lives in: House/apartment Stairs: Yes: Internal: flight steps; on right going up and on left going up and External: 5 steps; on right going up and on left going up Has following equipment at home: Single point cane, Environmental consultant - 4 wheeled, Tour manager, and Grab bars  OCCUPATION: retired Theatre stage manager  PLOF: Independent  PATIENT GOALS: to have less pain  NEXT MD VISIT: n/a  OBJECTIVE:   DIAGNOSTIC FINDINGS:  MR lumbar 2020 Advanced disc degeneration T12-L1 with mild spinal stenosis   Mild spinal stenosis L2-3   Moderate spinal stenosis L3-4 and L4-5  PATIENT SURVEYS:  FOTO 36  SCREENING FOR RED FLAGS: Bowel or bladder incontinence: No Spinal tumors: No Cauda equina syndrome: No Compression fracture: No Abdominal aneurysm: No  COGNITION: Overall cognitive status: Within functional limits for tasks assessed     SENSATION: Hot/Cold: Impaired R medial knee  MUSCLE LENGTH: Hamstrings: Right 25 deg; Left 40 deg   POSTURE: rounded shoulders, increased thoracic kyphosis, posterior pelvic tilt, and flexed trunk   PALPATION: Excessive muscle guarding along lumbar paraspinals and piriformis   LUMBAR ROM:    AROM eval  Flexion limited  Extension limited  Right lateral flexion limited  Left lateral flexion limited  Right rotation limited  Left rotation limited   (Blank rows = not tested)    LOWER EXTREMITY MMT:    MMT Right eval Left eval  Hip flexion 2+* 3  Hip extension    Hip abduction 3* 3  Hip adduction 3-* 3  Knee flexion 3-* 3  Knee extension 3 3  Ankle dorsiflexion 4 4  Ankle plantarflexion 4 4   (Blank rows = not tested)  LUMBAR SPECIAL TESTS:  Straight leg raise test: Positive, SI Compression/distraction test: Positive, FABER test: Negative, and scour negative; distraction positive   FUNCTIONAL TESTS:  5 times sit to stand: unable to perform due to pain 10 meter walk test: unable to perform due to pain Sit to stand: CGA due to extreme pain Supine,<>prone; slow, extra time due to pain  GAIT: Distance walked: 3 ft Assistive device utilized: Single point cane Level of assistance: CGA Comments: severe pain with walking  TODAY'S TREATMENT:                                                                                                                              DATE: 01/29/23 TherEx: Anterior tilt 10x supine (added to hEP) TrA activation 10x 3 second holds Adduction ball squeeze 10x   *Review HEP: Access  Code: [email or web address] URL: [email or web address] Date: 01/29/2023 Prepared by: Precious Bard  Exercises - Seated Anterior Pelvic Tilt  - 1 x daily - 7 x weekly - 2 sets - 10 reps - 5 hold - Supine Anterior Pelvic Tilt  - 1 x daily - 7 x weekly - 2 sets - 10 reps - 5 hold - Seated Hamstring Stretch  - 1 x daily - 7 x weekly - 2 sets - 2 reps - 30 hold - Supine Transversus Abdominis Bracing - Hands on Ground  - 1 x daily - 7 x weekly - 2 sets - 10 reps - 5 hold  Manual: Hamstring lengthening stretch 60 seconds each LE; x 2 trials Single knee to chest 60 seconds each LE  Piriformis stretch 60 seconds each LE x 2 trials  Nerve glides with SLR/  df/pf 20x each LE.   Sidelying: STM with implementation of effleurage and pettrisage to R lumbar paraspinals and quadratus lumborum  x14 minutes    PATIENT EDUCATION:  Education details: POC, anterior pelvic stretch Person educated: Patient and Spouse Education method: Explanation, Demonstration, Tactile cues, and Verbal cues Education comprehension: verbalized understanding, returned demonstration, verbal cues required, and tactile cues required  HOME EXERCISE PROGRAM: Anterior pelvic tilt 10x Hamstring stretch 30 seconds Lay prone 60 seconds   Access Code: [email or web address] URL: [email or web address] Date: 01/29/2023 Prepared by: Precious Bard  Exercises - Seated Anterior Pelvic Tilt  - 1 x daily - 7 x weekly - 2 sets - 10 reps - 5 hold - Supine Anterior Pelvic Tilt  - 1 x daily - 7 x weekly - 2 sets - 10 reps - 5 hold - Seated Hamstring Stretch  - 1 x daily - 7 x weekly - 2 sets - 2 reps - 30 hold - Supine Transversus Abdominis Bracing - Hands on Ground  - 1 x daily - 7 x weekly - 2 sets - 10 reps - 5 hold  ASSESSMENT:  CLINICAL IMPRESSION: Patient has a large trigger point and muscle spasm in R quadratus lumborum. Patient highly motivated for pain reduction due to high rate of pain upon presenting to PT. Pain reduced from 7/10 to 2/10 by end of session. Patient given HEP and demonstrated understanding.  Gentle mobilizations performed due to active cancer diagnosis undergoing infusions. Patient will benefit from skilled physical therapy to reduce pain, improve mobility, and return to PLOF.     OBJECTIVE IMPAIRMENTS: Abnormal gait, cardiopulmonary status limiting activity, decreased activity tolerance, decreased balance, decreased endurance, decreased mobility, difficulty walking, decreased ROM, decreased strength, hypomobility, impaired perceived functional ability, increased muscle spasms, impaired flexibility, impaired sensation, improper body mechanics, postural  dysfunction, obesity, and pain.   ACTIVITY LIMITATIONS: carrying, lifting, bending, sitting, standing, squatting, sleeping, stairs, transfers, bed mobility, bathing, toileting, dressing, reach over head, hygiene/grooming, and locomotion level  PARTICIPATION LIMITATIONS: meal prep, cleaning, laundry, medication management, personal finances, interpersonal relationship, driving, shopping, community activity, occupation, yard work, and church  PERSONAL FACTORS: Age, Behavior pattern, Education, Financial risk analyst, Past/current experiences, Profession, Sex, Social background, Time since onset of injury/illness/exacerbation, and 3+ comorbidities: chickenpox COPD, CAD, HTN, GERD, OSA, type II DM, bilateral TKA  are also affecting patient's functional outcome.   REHAB POTENTIAL: Good  CLINICAL DECISION MAKING: Evolving/moderate complexity  EVALUATION COMPLEXITY: Moderate   GOALS: Goals reviewed with patient? Yes  SHORT TERM GOALS: Target date: 02/23/2023    Patient will be independent in home exercise program to improve strength/mobility for better functional independence with ADLs. Baseline: 2/12: given to patient Goal  status: INITIAL  2.  .  Patient will report a worst pain of  5/10 on VAS in low back to improve tolerance with ADLs and reduced symptoms with activities. Baseline: 2/12: 10/10 Goal status: INITIAL    LONG TERM GOALS: Target date: 03/23/2023    Patient will increase FOTO score to equal to or greater than 48%    to demonstrate statistically significant improvement in mobility and quality of life.  Baseline: 2/12: 36% Goal status: INITIAL  2.  Patient will report a worst pain of 3/10 on VAS in low back to improve tolerance with ADLs and reduced symptoms with activities.  Baseline: 2/12: 10/10 Goal status: INITIAL  3.  Patient will increase 10 meter walk test to >1.69m/s as to improve gait speed for better community ambulation and to reduce fall risk Baseline: 2/12: unable to  ambulate 10 meters Goal status: INITIAL  4.  Patient (> 10 years old) will complete five times sit to stand test in < 15 seconds indicating an increased LE strength and improved balance. Baseline: 2/12: unable to perform due to severe pain Goal status: INITIAL    PLAN:  PT FREQUENCY: 2x/week  PT DURATION: 8 weeks  PLANNED INTERVENTIONS: Therapeutic exercises, Therapeutic activity, Neuromuscular re-education, Balance training, Gait training, Patient/Family education, Self Care, Joint mobilization, Stair training, Vestibular training, Canalith repositioning, Orthotic/Fit training, DME instructions, Electrical stimulation, Spinal manipulation, Spinal mobilization, Cryotherapy, Moist heat, Splintting, Taping, Vasopneumatic device, Traction, Ultrasound, Manual therapy, and Re-evaluation.  PLAN FOR NEXT SESSION: anterior tilting, reducing muscle tension, core and LE strengthening.    Precious Bard, PT 01/29/2023, 1:55 PM

## 2023-01-29 ENCOUNTER — Ambulatory Visit: Payer: Medicare Other

## 2023-01-29 DIAGNOSIS — M5459 Other low back pain: Secondary | ICD-10-CM | POA: Diagnosis not present

## 2023-01-29 DIAGNOSIS — R262 Difficulty in walking, not elsewhere classified: Secondary | ICD-10-CM | POA: Diagnosis not present

## 2023-01-29 DIAGNOSIS — G8929 Other chronic pain: Secondary | ICD-10-CM | POA: Diagnosis not present

## 2023-01-29 DIAGNOSIS — M6281 Muscle weakness (generalized): Secondary | ICD-10-CM

## 2023-01-29 DIAGNOSIS — M545 Low back pain, unspecified: Secondary | ICD-10-CM | POA: Diagnosis not present

## 2023-01-30 ENCOUNTER — Ambulatory Visit: Payer: Medicare Other

## 2023-01-30 ENCOUNTER — Telehealth: Payer: Self-pay | Admitting: Internal Medicine

## 2023-01-30 NOTE — Telephone Encounter (Signed)
Patient's spouse called to add labs in between treatment dates. Patient scheduled and notified.

## 2023-02-02 ENCOUNTER — Ambulatory Visit: Payer: Medicare Other

## 2023-02-02 ENCOUNTER — Other Ambulatory Visit: Payer: Self-pay | Admitting: Cardiology

## 2023-02-02 DIAGNOSIS — G8929 Other chronic pain: Secondary | ICD-10-CM | POA: Diagnosis not present

## 2023-02-02 DIAGNOSIS — R262 Difficulty in walking, not elsewhere classified: Secondary | ICD-10-CM | POA: Diagnosis not present

## 2023-02-02 DIAGNOSIS — M6281 Muscle weakness (generalized): Secondary | ICD-10-CM

## 2023-02-02 DIAGNOSIS — M5459 Other low back pain: Secondary | ICD-10-CM | POA: Diagnosis not present

## 2023-02-02 DIAGNOSIS — M545 Low back pain, unspecified: Secondary | ICD-10-CM | POA: Diagnosis not present

## 2023-02-02 NOTE — Therapy (Signed)
OUTPATIENT PHYSICAL THERAPY THORACOLUMBAR TREATMENT   Patient Name: Adam Wise MRN: [id number] DOB:09-01-1951, 72 y.o., male Today's Date: 02/02/2023  END OF SESSION:  PT End of Session - 02/02/23 1021     Visit Number 3    Number of Visits 16    Date for PT Re-Evaluation 03/23/23    Authorization Type 2/10 eval 01/26/23    PT Start Time 1020    PT Stop Time 1100    PT Time Calculation (min) 40 min    Activity Tolerance Patient limited by pain    Behavior During Therapy [facility] for tasks assessed/performed               Past Medical History:  Diagnosis Date   Chickenpox    Chronic knee pain    Chronic low back pain    COPD exacerbation ([place]) 10/11/2021   COPD with exacerbation ([place]) 10/12/2021   Coronary artery calcification seen on CAT scan 11/2021   Coronary CTA 11/27/2021: Moderate to large right pleural effusion and compressive atelectasis right lung base. => Coronary Calcium Score 657.  Diffuse RCA calcification.  Minimal mild disease in the LAD and diagonal branches. == Overall limited study.  Notable artifact.   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 He has disease progression after the first line of chemotherapy in December 2021.   Testosterone deficiency    Type 2 diabetes mellitus ([place])    Past Surgical History:  Procedure Laterality Date   CHEST TUBE INSERTION Right 01/20/2022   Procedure: CHEST TUBE INSERTION;  Surgeon: Candee Furbish, MD;  Location: [facility] ENDOSCOPY;  Service: Pulmonary;  Laterality: Right;  w/ Talc Pleurodesis, planned admit for Obs  afterwards   CHEST TUBE INSERTION Right 04/28/2022   Procedure: INSERTION PLEURAL DRAINAGE CATHETER - Pigtail tail, drainage;  Surgeon: Candee Furbish, MD;  Location: [facility] ENDOSCOPY;  Service: Pulmonary;  Laterality: Right;  talc pleurodesis   CHEST TUBE INSERTION Right 09/01/2022   Procedure: INSERTION PLEURAL DRAINAGE CATHETER;  Surgeon: Juanito Doom, MD;  Location: [place];  Service: Cardiopulmonary;  Laterality: Right;  pigtail catheter placement with talc pleurodesis   COLONOSCOPY WITH PROPOFOL N/A 12/17/2018   Procedure: COLONOSCOPY WITH PROPOFOL;  Surgeon: Virgel Manifold, MD;  Location: [facility] ENDOSCOPY;  Service: Gastroenterology;  Laterality: N/A;   IR IMAGING GUIDED PORT INSERTION  04/17/2020   IR THORACENTESIS ASP PLEURAL SPACE W/IMG GUIDE  11/29/2021   IR THORACENTESIS ASP PLEURAL SPACE W/IMG GUIDE  01/03/2022   JOINT REPLACEMENT Bilateral    REPLACEMENT TOTAL KNEE BILATERAL  2015   TALC PLEURODESIS  09/01/2022   Procedure: Pietro Cassis;  Surgeon: Juanito Doom, MD;  Location: [facility] ENDOSCOPY;  Service: Cardiopulmonary;;   [place] ECHOCARDIOGRAM  05/2020   a) 05/2020: EF 55 to 60%.  No R WMA.  Mild LVH.  Indeterminate LVEDP.  Unable to assess RVP.  Normal aortic and mitral valves.  Mildly elevated RAP.; b) 09/2021: EF 50-55%. No RWMA. Mild LVH. ~ LVEDP. Mild LA Dilation. NORMAL RV/RAP.  Normal MV/AoV.   Patient Active Problem List   Diagnosis  Date Noted   Hip pain 12/09/2022   Dizziness 11/12/2022   Chronic respiratory failure with hypoxia (HCC) 09/18/2022   Pneumothorax 09/02/2022   Pneumothorax on right 09/01/2022   Recurrent pleural effusion 08/27/2022   Recurrent pleural effusion on right 01/16/2022   Preop cardiovascular exam 12/19/2021   Coronary Calcium Score 657 11/13/2021   Atypical chest pain 11/13/2021   Pleural effusion 10/29/2021   Acute on chronic respiratory failure with hypoxia ([place]) 10/12/2021   Recurrent right  pleural effusion 08/08/2021   Erectile dysfunction 10/26/2020   Port-A-Cath in place 06/11/2020   History of pulmonary embolism 05/15/2020   Small cell lung cancer, right upper lobe ([place]) 04/12/2020   Chronic fatigue 04/12/2020   Encounter for antineoplastic chemotherapy 04/12/2020   Encounter for antineoplastic immunotherapy 04/12/2020   Malignant neoplasm of upper lobe of right lung ([place]) 04/04/2020   Goals of care, counseling/discussion 03/30/2020   Exertional dyspnea 03/26/2020   Hyperlipidemia associated with type 2 diabetes mellitus ([place]) 11/08/2019   Joint swelling 11/16/2018   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   Essential hypertension 09/09/2017   Type 2 diabetes mellitus ([place]) 09/09/2017   GERD (gastroesophageal reflux disease) 09/09/2017   Testosterone deficiency 09/09/2017   OSA (obstructive sleep apnea) 09/09/2017   Chronic bilateral low back pain without sciatica 09/09/2017   H/O knee surgery 04/27/2014   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  Pt states that the exercises are not helping at this point.  6/10 pain reported today.  PERTINENT HISTORY:  Patient is a very pleasant 72 year old male presenting to physical therapy for chronic low back pain without sciatica. Patient has been seen by this therapist in the past. Patient additionally has  relapsed extensive stage small cell lung cancer and now multiple metastatic liver lesions diagnosed in April 2021 with history of radiotherapy and chemotherapy. PMH includes chickenpox COPD, CAD, HTN, GERD, OSA, type II DM, bilateral TKA. Patient tried figure four and stomach pressing up. Also having vertigo.   PAIN:  Are you having pain? 7/10 pain Yes: NPRS scale: worst pain: 10/10; least amount of pain 4/10 current pain 6-7/10 Pain location: R sided low back pain radiating to back of knee; sometimes feeling like wet pants of adduction. Pain description: radiating to back pain Aggravating factors: getting up,  Relieving factors: laying down in bed, bending over the sink  PRECAUTIONS: Other: back, cancer   WEIGHT BEARING RESTRICTIONS: No  FALLS:  Has patient fallen in last 6 months? No  LIVING ENVIRONMENT: Lives with: lives with their spouse Lives in: House/apartment Stairs: Yes: Internal: flight steps; on right going up and on left going up and External: 5 steps; on right going up and on left going up Has following equipment at home: Single point cane, Environmental consultant - 4 wheeled, Electronics engineer, and Grab bars  OCCUPATION: retired IT trainer  PLOF: Independent  PATIENT GOALS: to have less pain  NEXT MD VISIT: n/a  OBJECTIVE:   DIAGNOSTIC FINDINGS:  MR lumbar 2020 Advanced disc degeneration T12-L1 with mild spinal stenosis   Mild spinal stenosis L2-3   Moderate spinal stenosis L3-4 and L4-5  PATIENT SURVEYS:  FOTO 36  SCREENING FOR RED FLAGS: Bowel or bladder incontinence: No Spinal tumors: No Cauda equina syndrome: No Compression fracture: No Abdominal aneurysm: No  COGNITION: Overall cognitive status: Within functional limits for tasks assessed     SENSATION: Hot/Cold: Impaired R medial knee  MUSCLE LENGTH: Hamstrings: Right 25 deg; Left 40 deg   POSTURE: rounded shoulders, increased thoracic kyphosis, posterior pelvic tilt, and flexed trunk    PALPATION: Excessive muscle guarding along lumbar paraspinals and piriformis   LUMBAR ROM:   AROM eval  Flexion limited  Extension limited  Right lateral flexion limited  Left lateral flexion limited  Right rotation limited  Left rotation limited   (Blank rows = not tested)    LOWER EXTREMITY MMT:    MMT Right eval Left eval  Hip flexion 2+* 3  Hip extension    Hip abduction 3* 3  Hip adduction 3-* 3  Knee flexion 3-* 3  Knee extension 3 3  Ankle dorsiflexion 4 4  Ankle plantarflexion 4 4   (Blank rows = not tested)  LUMBAR SPECIAL TESTS:  Straight leg raise test: Positive, SI Compression/distraction test: Positive, FABER test: Negative, and scour negative; distraction positive   FUNCTIONAL TESTS:  5 times sit to stand: unable to perform due to pain 10 meter walk test: unable to perform due to pain Sit to stand: CGA due to extreme pain Supine,<>prone; slow, extra time due to pain  GAIT: Distance walked: 3 ft Assistive device utilized: Single point cane Level of assistance: CGA Comments: severe pain with walking  TODAY'S TREATMENT:                                                                                                                              DATE: 02/02/23    TherEx:  Hooklying lower trunk rotation, 2x10  Supine anterior tilt 10x supine  TrA activation 10x 3 second holds Supine adduction ball squeeze 10x  Supine hamstring curls with green physioball, 2x10 Sidelying open book slides, 2x10 each side for improved thoracic mobilization   Manual:  Hamstring lengthening stretch 60 seconds each LE; x 2 trials Single knee to chest 60 seconds each LE  Piriformis stretch 60 seconds each LE x 2 trials  Nerve glides with SLR/ df/pf 20x each LE Supine ITB lengthening stretch, 60 sec bouts each LE, x3 trials      PATIENT EDUCATION:  Education details: POC, anterior pelvic stretch Person educated: Patient and Spouse Education method:  Explanation, Demonstration, Tactile cues, and Verbal cues Education comprehension: verbalized understanding, returned demonstration, verbal cues required, and tactile cues required  HOME EXERCISE PROGRAM: Anterior pelvic tilt 10x Hamstring stretch 30 seconds Lay prone 60 seconds   Access Code: [email or web address] URL: [email or web address] Date: 01/29/2023 Prepared by: Janna Arch  Exercises - Seated Anterior Pelvic Tilt  - 1 x daily - 7 x weekly - 2 sets - 10 reps - 5 hold - Supine Anterior Pelvic Tilt  - 1 x daily - 7 x weekly - 2 sets - 10 reps - 5 hold - Seated Hamstring Stretch  - 1 x daily - 7 x weekly - 2 sets - 2 reps - 30 hold - Supine Transversus Abdominis Bracing - Hands on Ground  - 1 x daily - 7 x weekly - 2 sets - 10 reps - 5 hold  ASSESSMENT:  CLINICAL IMPRESSION:  Pt responded well to the manual therapy approaches and exercises listed above.  Pt continues to put forth great effort throughout the session. Time was spent on alleviating the main areas of pain that pt reported initially upon arrival, including the ITB for which he stated has been bothering him in the past.   Pt will continue to benefit from skilled therapy to address remaining deficits in order to improve overall QoL and return to PLOF.      OBJECTIVE IMPAIRMENTS: Abnormal gait, cardiopulmonary status limiting activity, decreased activity tolerance, decreased balance, decreased endurance, decreased mobility, difficulty walking, decreased ROM, decreased strength, hypomobility, impaired perceived functional ability, increased muscle spasms, impaired flexibility, impaired sensation, improper body mechanics, postural dysfunction, obesity, and pain.   ACTIVITY LIMITATIONS: carrying, lifting, bending, sitting, standing, squatting, sleeping, stairs, transfers, bed mobility, bathing, toileting, dressing, reach over head, hygiene/grooming, and locomotion level  PARTICIPATION LIMITATIONS: meal prep, cleaning,  laundry, medication management, personal finances, interpersonal relationship, driving, shopping, community activity, occupation, yard work, and church  PERSONAL FACTORS: Age, Behavior pattern, Education, Restaurant manager, fast food, Past/current experiences, Profession, Sex, Social background, Time since onset of injury/illness/exacerbation, and 3+ comorbidities: chickenpox COPD, CAD, HTN, GERD, OSA, type II DM, bilateral TKA  are also affecting patient's functional outcome.   REHAB POTENTIAL: Good  CLINICAL DECISION MAKING: Evolving/moderate complexity  EVALUATION COMPLEXITY: Moderate   GOALS: Goals reviewed with patient? Yes  SHORT TERM GOALS: Target date: 02/23/2023  Patient will be independent in home exercise program to improve strength/mobility for better functional independence with ADLs. Baseline: 2/12: given to patient Goal status: INITIAL  2.  .  Patient will report a worst pain of  5/10 on VAS in low back to improve tolerance with ADLs and reduced symptoms with activities. Baseline: 2/12: 10/10 Goal status: INITIAL    LONG TERM GOALS: Target date: 03/23/2023  Patient will increase FOTO score to equal to or greater than 48%    to demonstrate statistically significant improvement in mobility and quality  of life.  Baseline: 2/12: 36% Goal status: INITIAL  2.  Patient will report a worst pain of 3/10 on VAS in low back to improve tolerance with ADLs and reduced symptoms with activities.  Baseline: 2/12: 10/10 Goal status: INITIAL  3.  Patient will increase 10 meter walk test to >1.37m/s as to improve gait speed for better community ambulation and to reduce fall risk Baseline: 2/12: unable to ambulate 10 meters Goal status: INITIAL  4.  Patient (> 9 years old) will complete five times sit to stand test in < 15 seconds indicating an increased LE strength and improved balance. Baseline: 2/12: unable to perform due to severe pain Goal status: INITIAL    PLAN:  PT FREQUENCY: 2x/week  PT  DURATION: 8 weeks  PLANNED INTERVENTIONS: Therapeutic exercises, Therapeutic activity, Neuromuscular re-education, Balance training, Gait training, Patient/Family education, Self Care, Joint mobilization, Stair training, Vestibular training, Canalith repositioning, Orthotic/Fit training, DME instructions, Electrical stimulation, Spinal manipulation, Spinal mobilization, Cryotherapy, Moist heat, Splintting, Taping, Vasopneumatic device, Traction, Ultrasound, Manual therapy, and Re-evaluation.  PLAN FOR NEXT SESSION:   anterior tilting, reducing muscle tension, core and LE strengthening.     Gwenlyn Saran, PT, DPT Physical Therapist - [facility]  02/02/23, 10:21 AM

## 2023-02-04 ENCOUNTER — Inpatient Hospital Stay: Payer: Medicare Other

## 2023-02-04 ENCOUNTER — Ambulatory Visit: Payer: Medicare Other

## 2023-02-04 ENCOUNTER — Other Ambulatory Visit: Payer: Self-pay | Admitting: Medical Oncology

## 2023-02-04 ENCOUNTER — Telehealth: Payer: Self-pay | Admitting: Medical Oncology

## 2023-02-04 DIAGNOSIS — M545 Low back pain, unspecified: Secondary | ICD-10-CM | POA: Diagnosis not present

## 2023-02-04 DIAGNOSIS — Z95828 Presence of other vascular implants and grafts: Secondary | ICD-10-CM | POA: Diagnosis not present

## 2023-02-04 DIAGNOSIS — D649 Anemia, unspecified: Secondary | ICD-10-CM

## 2023-02-04 DIAGNOSIS — M5459 Other low back pain: Secondary | ICD-10-CM

## 2023-02-04 DIAGNOSIS — R262 Difficulty in walking, not elsewhere classified: Secondary | ICD-10-CM | POA: Diagnosis not present

## 2023-02-04 DIAGNOSIS — G8929 Other chronic pain: Secondary | ICD-10-CM | POA: Diagnosis not present

## 2023-02-04 DIAGNOSIS — M6281 Muscle weakness (generalized): Secondary | ICD-10-CM

## 2023-02-04 DIAGNOSIS — C3411 Malignant neoplasm of upper lobe, right bronchus or lung: Secondary | ICD-10-CM

## 2023-02-04 DIAGNOSIS — C3490 Malignant neoplasm of unspecified part of unspecified bronchus or lung: Secondary | ICD-10-CM | POA: Diagnosis not present

## 2023-02-04 LAB — CBC WITH DIFFERENTIAL (CANCER CENTER ONLY)
Abs Immature Granulocytes: 0.02 10*3/uL (ref 0.00–0.07)
Basophils Absolute: 0 10*3/uL (ref 0.0–0.1)
Basophils Relative: 1 %
Eosinophils Absolute: 0.1 10*3/uL (ref 0.0–0.5)
Eosinophils Relative: 3 %
HCT: 23.3 % — ABNORMAL LOW (ref 39.0–52.0)
Hemoglobin: 7.8 g/dL — ABNORMAL LOW (ref 13.0–17.0)
Immature Granulocytes: 1 %
Lymphocytes Relative: 13 %
Lymphs Abs: 0.2 10*3/uL — ABNORMAL LOW (ref 0.7–4.0)
MCH: 31.7 pg (ref 26.0–34.0)
MCHC: 33.5 g/dL (ref 30.0–36.0)
MCV: 94.7 fL (ref 80.0–100.0)
Monocytes Absolute: 0.3 10*3/uL (ref 0.1–1.0)
Monocytes Relative: 16 %
Neutro Abs: 1.2 10*3/uL — ABNORMAL LOW (ref 1.7–7.7)
Neutrophils Relative %: 66 %
Platelet Count: 57 10*3/uL — ABNORMAL LOW (ref 150–400)
RBC: 2.46 MIL/uL — ABNORMAL LOW (ref 4.22–5.81)
RDW: 14.9 % (ref 11.5–15.5)
WBC Count: 1.8 10*3/uL — ABNORMAL LOW (ref 4.0–10.5)
nRBC: 0 % (ref 0.0–0.2)

## 2023-02-04 LAB — CMP (CANCER CENTER ONLY)
ALT: 17 U/L (ref 0–44)
AST: 18 U/L (ref 15–41)
Albumin: 3.8 g/dL (ref 3.5–5.0)
Alkaline Phosphatase: 55 U/L (ref 38–126)
Anion gap: 9 (ref 5–15)
BUN: 18 mg/dL (ref 8–23)
CO2: 28 mmol/L (ref 22–32)
Calcium: 8.2 mg/dL — ABNORMAL LOW (ref 8.9–10.3)
Chloride: 101 mmol/L (ref 98–111)
Creatinine: 0.94 mg/dL (ref 0.61–1.24)
GFR, Estimated: >60 mL/min (ref >60)
Glucose, Bld: 176 mg/dL — ABNORMAL HIGH (ref 70–99)
Potassium: 4.1 mmol/L (ref 3.5–5.1)
Sodium: 138 mmol/L (ref 135–145)
Total Bilirubin: 0.4 mg/dL (ref 0.3–1.2)
Total Protein: 6.4 g/dL — ABNORMAL LOW (ref 6.5–8.1)

## 2023-02-04 LAB — SAMPLE TO BLOOD BANK

## 2023-02-04 MED ORDER — SODIUM CHLORIDE 0.9% FLUSH
10.0000 mL | Freq: Once | INTRAVENOUS | Status: AC
  Administered 2023-02-04: 10 mL

## 2023-02-04 MED ORDER — HEPARIN SOD (PORK) LOCK FLUSH 100 UNIT/ML IV SOLN
500.0000 [IU] | Freq: Once | INTRAVENOUS | Status: AC
  Administered 2023-02-04: 500 [IU]

## 2023-02-04 NOTE — Telephone Encounter (Signed)
Pt notified that he needs a blood transfusion. He knows to keep his blue armband on . I told him a scheduler will call him.

## 2023-02-04 NOTE — Therapy (Signed)
OUTPATIENT PHYSICAL THERAPY THORACOLUMBAR TREATMENT   Patient Name: Adam Wise MRN: [id number] DOB:[birth year], 72 y.o., male Today's Date: 02/04/2023  END OF SESSION:  PT End of Session - 02/04/23 1515     Visit Number 4    Number of Visits 16    Date for PT Re-Evaluation 03/23/23    Authorization Type 2/10 eval 01/26/23    PT Start Time 1515    PT Stop Time 1559    PT Time Calculation (min) 44 min    Activity Tolerance Patient limited by pain    Behavior During Therapy [facility] for tasks assessed/performed                Past Medical History:  Diagnosis Date   Chickenpox    Chronic knee pain    Chronic low back pain    COPD exacerbation ([place]) 10/11/2021   COPD with exacerbation ([place]) 10/12/2021   Coronary artery calcification seen on CAT scan 11/2021   Coronary CTA 11/27/2021: Moderate to large right pleural effusion and compressive atelectasis right lung base. => Coronary Calcium Score 657.  Diffuse RCA calcification.  Minimal mild disease in the LAD and diagonal branches. == Overall limited study.  Notable artifact.   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 He has disease progression after the first line of chemotherapy in December 2021.   Testosterone deficiency    Type 2 diabetes mellitus ([place])    Past Surgical History:  Procedure Laterality Date   CHEST TUBE INSERTION Right 01/20/2022   Procedure: CHEST TUBE INSERTION;  Surgeon: Candee Furbish, MD;  Location: [facility] ENDOSCOPY;  Service: Pulmonary;  Laterality: Right;  w/ Talc Pleurodesis, planned admit for Obs  afterwards   CHEST TUBE INSERTION Right 04/28/2022   Procedure: INSERTION PLEURAL DRAINAGE CATHETER - Pigtail tail, drainage;  Surgeon: Candee Furbish, MD;  Location: [facility] ENDOSCOPY;  Service: Pulmonary;  Laterality: Right;  talc pleurodesis   CHEST TUBE INSERTION Right 09/01/2022   Procedure: INSERTION PLEURAL DRAINAGE CATHETER;  Surgeon: Juanito Doom, MD;  Location: [place];  Service: Cardiopulmonary;  Laterality: Right;  pigtail catheter placement with talc pleurodesis   COLONOSCOPY WITH PROPOFOL N/A 12/17/2018   Procedure: COLONOSCOPY WITH PROPOFOL;  Surgeon: Virgel Manifold, MD;  Location: [facility] ENDOSCOPY;  Service: Gastroenterology;  Laterality: N/A;   IR IMAGING GUIDED PORT INSERTION  04/17/2020   IR THORACENTESIS ASP PLEURAL SPACE W/IMG GUIDE  11/29/2021   IR THORACENTESIS ASP PLEURAL SPACE W/IMG GUIDE  01/03/2022   JOINT REPLACEMENT Bilateral    REPLACEMENT TOTAL KNEE BILATERAL  2015   TALC PLEURODESIS  09/01/2022   Procedure: Pietro Cassis;  Surgeon: Juanito Doom, MD;  Location: [facility] ENDOSCOPY;  Service: Cardiopulmonary;;   [place] ECHOCARDIOGRAM  05/2020   a) 05/2020: EF 55 to 60%.  No R WMA.  Mild LVH.  Indeterminate LVEDP.  Unable to assess RVP.  Normal aortic and mitral valves.  Mildly elevated RAP.; b) 09/2021: EF 50-55%. No RWMA. Mild LVH. ~ LVEDP. Mild LA Dilation. NORMAL RV/RAP.  Normal MV/AoV.   Patient Active Problem List  Diagnosis Date Noted   Hip pain 12/09/2022   Dizziness 11/12/2022   Chronic respiratory failure with hypoxia (HCC) 09/18/2022   Pneumothorax 09/02/2022   Pneumothorax on right 09/01/2022   Recurrent pleural effusion 08/27/2022   Recurrent pleural effusion on right 01/16/2022   Preop cardiovascular exam 12/19/2021   Coronary Calcium Score 657 11/13/2021   Atypical chest pain 11/13/2021   Pleural effusion 10/29/2021   Acute on chronic respiratory failure with hypoxia ([place]) 10/12/2021   Recurrent right  pleural effusion 08/08/2021   Erectile dysfunction 10/26/2020   Port-A-Cath in place 06/11/2020   History of pulmonary embolism 05/15/2020   Small cell lung cancer, right upper lobe ([place]) 04/12/2020   Chronic fatigue 04/12/2020   Encounter for antineoplastic chemotherapy 04/12/2020   Encounter for antineoplastic immunotherapy 04/12/2020   Malignant neoplasm of upper lobe of right lung ([place]) 04/04/2020   Goals of care, counseling/discussion 03/30/2020   Exertional dyspnea 03/26/2020   Hyperlipidemia associated with type 2 diabetes mellitus ([place]) 11/08/2019   Joint swelling 11/16/2018   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   Essential hypertension 09/09/2017   Type 2 diabetes mellitus ([place]) 09/09/2017   GERD (gastroesophageal reflux disease) 09/09/2017   Testosterone deficiency 09/09/2017   OSA (obstructive sleep apnea) 09/09/2017   Chronic bilateral low back pain without sciatica 09/09/2017   H/O knee surgery 04/27/2014   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 No falls, has been busy with physician appointments.  6/10 pain reported today.  PERTINENT HISTORY:  Patient is a very pleasant 72 year old male presenting to physical therapy for chronic low back pain without sciatica. Patient has been seen by this therapist in the past. Patient additionally has relapsed extensive stage  small cell lung cancer and now multiple metastatic liver lesions diagnosed in April 2021 with history of radiotherapy and chemotherapy. PMH includes chickenpox COPD, CAD, HTN, GERD, OSA, type II DM, bilateral TKA. Patient tried figure four and stomach pressing up. Also having vertigo.   PAIN:  Are you having pain? 7/10 pain Yes: NPRS  scale: worst pain: 10/10; least amount of pain 4/10 current pain 6-7/10 Pain location: R sided low back pain radiating to back of knee; sometimes feeling like wet pants of adduction. Pain description: radiating to back pain Aggravating factors: getting up,  Relieving factors: laying down in bed, bending over the sink  PRECAUTIONS: Other: back, cancer   WEIGHT BEARING RESTRICTIONS: No  FALLS:  Has patient fallen in last 6 months? No  LIVING ENVIRONMENT: Lives with: lives with their spouse Lives in: House/apartment Stairs: Yes: Internal: flight steps; on right going up and on left going up and External: 5 steps; on right going up and on left going up Has following equipment at home: Single point cane, Environmental consultant - 4 wheeled, Electronics engineer, and Grab bars  OCCUPATION: retired IT trainer  PLOF: Independent  PATIENT GOALS: to have less pain  NEXT MD VISIT: n/a  OBJECTIVE:   DIAGNOSTIC FINDINGS:  MR lumbar 2020 Advanced disc degeneration T12-L1 with mild spinal stenosis   Mild spinal stenosis L2-3   Moderate spinal stenosis L3-4 and L4-5  PATIENT SURVEYS:  FOTO 36  SCREENING FOR RED FLAGS: Bowel or bladder incontinence: No Spinal tumors: No Cauda equina syndrome: No Compression fracture: No Abdominal aneurysm: No  COGNITION: Overall cognitive status: Within functional limits for tasks assessed     SENSATION: Hot/Cold: Impaired R medial knee  MUSCLE LENGTH: Hamstrings: Right 25 deg; Left 40 deg   POSTURE: rounded shoulders, increased thoracic kyphosis, posterior pelvic tilt, and flexed trunk   PALPATION: Excessive muscle guarding  along lumbar paraspinals and piriformis   LUMBAR ROM:   AROM eval  Flexion limited  Extension limited  Right lateral flexion limited  Left lateral flexion limited  Right rotation limited  Left rotation limited   (Blank rows = not tested)    LOWER EXTREMITY MMT:    MMT Right eval Left eval  Hip flexion 2+* 3  Hip extension    Hip abduction 3* 3  Hip adduction 3-* 3  Knee flexion 3-* 3  Knee extension 3 3  Ankle dorsiflexion 4 4  Ankle plantarflexion 4 4   (Blank rows = not tested)  LUMBAR SPECIAL TESTS:  Straight leg raise test: Positive, SI Compression/distraction test: Positive, FABER test: Negative, and scour negative; distraction positive   FUNCTIONAL TESTS:  5 times sit to stand: unable to perform due to pain 10 meter walk test: unable to perform due to pain Sit to stand: CGA due to extreme pain Supine,<>prone; slow, extra time due to pain  GAIT: Distance walked: 3 ft Assistive device utilized: Single point cane Level of assistance: CGA Comments: severe pain with walking  TODAY'S TREATMENT:                                                                                                                              DATE: 02/04/23    TherEx:  Hooklying lower trunk rotation, 2x10 Supine anterior tilt 10x supine  TrA activation  10x 3 second holds with cue for exhale  TrA activation with green swiss ball 10x     Manual:  Hamstring lengthening stretch 60 seconds each LE; x 2 trials Single knee to chest 60 seconds each LE  Piriformis stretch 60 seconds each LE x 2 trials  Nerve glides with SLR/ df/pf 20x each LE Supine ITB lengthening stretch, 60 sec bouts each LE, x3 trials IT band roller x4 minutes Stm to left paraspinal and quadratus lumborum with implementation of effleurage and ptrissage x8 minutes      PATIENT EDUCATION:  Education details: POC, anterior pelvic stretch Person educated: Patient and Spouse Education method: Explanation,  Demonstration, Tactile cues, and Verbal cues Education comprehension: verbalized understanding, returned demonstration, verbal cues required, and tactile cues required  HOME EXERCISE PROGRAM: Anterior pelvic tilt 10x Hamstring stretch 30 seconds Lay prone 60 seconds   Access Code: [email or web address] URL: [email or web address] Date: 01/29/2023 Prepared by: Janna Arch  Exercises - Seated Anterior Pelvic Tilt  - 1 x daily - 7 x weekly - 2 sets - 10 reps - 5 hold - Supine Anterior Pelvic Tilt  - 1 x daily - 7 x weekly - 2 sets - 10 reps - 5 hold - Seated Hamstring Stretch  - 1 x daily - 7 x weekly - 2 sets - 2 reps - 30 hold - Supine Transversus Abdominis Bracing - Hands on Ground  - 1 x daily - 7 x weekly - 2 sets - 10 reps - 5 hold  ASSESSMENT:  CLINICAL IMPRESSION:  Patient presents with excellent motivation. Core activation is very challenging for patient requiring multiple cues in order to obtain correct activation.  IT band release is painful initially for patient but improves with repetition.  Pt will continue to benefit from skilled therapy to address remaining deficits in order to improve overall QoL and return to PLOF.      OBJECTIVE IMPAIRMENTS: Abnormal gait, cardiopulmonary status limiting activity, decreased activity tolerance, decreased balance, decreased endurance, decreased mobility, difficulty walking, decreased ROM, decreased strength, hypomobility, impaired perceived functional ability, increased muscle spasms, impaired flexibility, impaired sensation, improper body mechanics, postural dysfunction, obesity, and pain.   ACTIVITY LIMITATIONS: carrying, lifting, bending, sitting, standing, squatting, sleeping, stairs, transfers, bed mobility, bathing, toileting, dressing, reach over head, hygiene/grooming, and locomotion level  PARTICIPATION LIMITATIONS: meal prep, cleaning, laundry, medication management, personal finances, interpersonal relationship, driving,  shopping, community activity, occupation, yard work, and church  PERSONAL FACTORS: Age, Behavior pattern, Education, Restaurant manager, fast food, Past/current experiences, Profession, Sex, Social background, Time since onset of injury/illness/exacerbation, and 3+ comorbidities: chickenpox COPD, CAD, HTN, GERD, OSA, type II DM, bilateral TKA  are also affecting patient's functional outcome.   REHAB POTENTIAL: Good  CLINICAL DECISION MAKING: Evolving/moderate complexity  EVALUATION COMPLEXITY: Moderate   GOALS: Goals reviewed with patient? Yes  SHORT TERM GOALS: Target date: 02/23/2023  Patient will be independent in home exercise program to improve strength/mobility for better functional independence with ADLs. Baseline: 2/12: given to patient Goal status: INITIAL  2.  .  Patient will report a worst pain of  5/10 on VAS in low back to improve tolerance with ADLs and reduced symptoms with activities. Baseline: 2/12: 10/10 Goal status: INITIAL    LONG TERM GOALS: Target date: 03/23/2023  Patient will increase FOTO score to equal to or greater than 48%    to demonstrate statistically significant improvement in mobility and quality of life.  Baseline: 2/12: 36% Goal status: INITIAL  2.  Patient will report a worst pain  of 3/10 on VAS in low back to improve tolerance with ADLs and reduced symptoms with activities.  Baseline: 2/12: 10/10 Goal status: INITIAL  3.  Patient will increase 10 meter walk test to >1.90m/s as to improve gait speed for better community ambulation and to reduce fall risk Baseline: 2/12: unable to ambulate 10 meters Goal status: INITIAL  4.  Patient (> 56 years old) will complete five times sit to stand test in < 15 seconds indicating an increased LE strength and improved balance. Baseline: 2/12: unable to perform due to severe pain Goal status: INITIAL    PLAN:  PT FREQUENCY: 2x/week  PT DURATION: 8 weeks  PLANNED INTERVENTIONS: Therapeutic exercises, Therapeutic  activity, Neuromuscular re-education, Balance training, Gait training, Patient/Family education, Self Care, Joint mobilization, Stair training, Vestibular training, Canalith repositioning, Orthotic/Fit training, DME instructions, Electrical stimulation, Spinal manipulation, Spinal mobilization, Cryotherapy, Moist heat, Splintting, Taping, Vasopneumatic device, Traction, Ultrasound, Manual therapy, and Re-evaluation.  PLAN FOR NEXT SESSION:   anterior tilting, reducing muscle tension, core and LE strengthening.     Janna Arch PT  Physical Therapist - [facility]  02/04/23, 4:06 PM

## 2023-02-05 ENCOUNTER — Inpatient Hospital Stay: Payer: Medicare Other

## 2023-02-05 DIAGNOSIS — Z95828 Presence of other vascular implants and grafts: Secondary | ICD-10-CM | POA: Diagnosis not present

## 2023-02-05 DIAGNOSIS — C3490 Malignant neoplasm of unspecified part of unspecified bronchus or lung: Secondary | ICD-10-CM | POA: Diagnosis not present

## 2023-02-05 DIAGNOSIS — D649 Anemia, unspecified: Secondary | ICD-10-CM

## 2023-02-05 DIAGNOSIS — C3411 Malignant neoplasm of upper lobe, right bronchus or lung: Secondary | ICD-10-CM | POA: Diagnosis not present

## 2023-02-05 LAB — PREPARE RBC (CROSSMATCH)

## 2023-02-05 MED ORDER — DIPHENHYDRAMINE HCL 25 MG PO CAPS: 25.0000 mg | ORAL_CAPSULE | Freq: Once | ORAL | Status: DC

## 2023-02-05 MED ORDER — SODIUM CHLORIDE 0.9% IV SOLUTION
250.0000 mL | Freq: Once | INTRAVENOUS | Status: AC
  Administered 2023-02-05: 250 mL via INTRAVENOUS

## 2023-02-05 MED ORDER — SODIUM CHLORIDE 0.9% FLUSH
10.0000 mL | INTRAVENOUS | Status: AC | PRN
  Administered 2023-02-05: 10 mL

## 2023-02-05 MED ORDER — ACETAMINOPHEN 325 MG PO TABS
650.0000 mg | ORAL_TABLET | Freq: Once | ORAL | Status: AC
  Administered 2023-02-05: 650 mg via ORAL
  Filled 2023-02-05: qty 2

## 2023-02-05 MED ORDER — LORATADINE 10 MG PO TABS
10.0000 mg | ORAL_TABLET | Freq: Once | ORAL | Status: AC
  Administered 2023-02-05: 10 mg via ORAL
  Filled 2023-02-05: qty 1

## 2023-02-05 MED ORDER — HEPARIN SOD (PORK) LOCK FLUSH 100 UNIT/ML IV SOLN
500.0000 [IU] | Freq: Every day | INTRAVENOUS | Status: AC | PRN
  Administered 2023-02-05: 500 [IU]

## 2023-02-05 NOTE — Patient Instructions (Signed)
Blood Transfusion, Adult A blood transfusion is a procedure in which you receive blood or a type of blood cell (blood component) through an IV. You may need a blood transfusion when you have a low blood count, which is a low number of any blood cell. This may result from a bleeding disorder, illness, injury, or surgery. The blood may come from a donor, or you may be able to have your own blood collected and stored (autologous blood donation) before a planned surgery. The blood given in a transfusion may be made up of different blood components. You may receive: Red blood cells. These carry oxygen to the cells in the body. Platelets. These help your blood to clot. Plasma. This is the liquid part of your blood. It carries proteins and other substances throughout the body. White blood cells. These help you fight infections. If you have hemophilia or another clotting disorder, you may also receive other types of blood products. Depending on the type of blood product, this procedure may take 1-4 hours to complete. Tell a health care provider about: Any bleeding problems you have. Any previous reactions you have had during a blood transfusion. Any allergies you have. All medicines you are taking, including vitamins, herbs, eye drops, creams, and over-the-counter medicines. Any surgeries you have had. Any medical conditions you have. Whether you are pregnant or may be pregnant. What are the risks? Talk with your health care provider about risks. The most common problems include: A mild allergic reaction, such as red, swollen areas of skin (hives) and itching. Fever or chills. This may be the body's response to new blood cells received. This may occur during or up to 4 hours after the transfusion. More serious problems may include: A serious allergic reaction that causes difficulty breathing or swelling around the face and lips. Transfusion-associated circulatory overload (TACO), or too much fluid in  the lungs. This may cause breathing problems. Transfusion-related acute lung injury (TRALI), which causes breathing difficulty and low oxygen in the blood. This can occur within hours of the transfusion or several days later. Iron overload. This can happen after receiving many blood transfusions over a period of time. Infection or virus being transmitted. This is rare because donated blood is carefully tested before it is given. Hemolytic transfusion reaction. This is rare. It happens when the body's defense system (immune system)tries to attack the new blood cells. Symptoms may include fever, chills, nausea, low blood pressure, and low back or chest pain. Transfusion-associated graft-versus-host disease (TAGVHD). This is rare. It happens when donated cells attack the body's healthy tissues. What happens before the procedure? You will have a blood test to check your blood type. This test is done to know what kind of blood your body will accept and to match it to the donor blood. If you are going to have a planned surgery, you may be able to do an autologous blood donation. This may be done in case you need to have a transfusion. You will have your temperature, blood pressure, and pulse checked before the transfusion. If you have had an allergic reaction to a transfusion in the past, you may be given medicine to help prevent a reaction. This medicine may be given to you by mouth (orally) or through an IV. What happens during the procedure?  An IV will be inserted into one of your veins. The bag of blood will be attached to your IV. The blood will then enter through your vein. Your temperature, blood pressure,   and pulse will be monitored during the transfusion. This monitoring is done to detect early signs of a transfusion reaction. Tell your nurse right away if you have any of these symptoms during the transfusion: Shortness of breath or trouble breathing. Chest or back pain. Fever or  chills. Itching or hives. If you have any signs or symptoms of a reaction, your transfusion will be stopped and you may be given medicine. When the transfusion is complete, your IV will be removed. Pressure may be applied to the IV site for a few minutes. A bandage (dressing)will be applied. The procedure may vary among health care providers and hospitals. What happens after the procedure? Your temperature, blood pressure, pulse, breathing rate, and blood oxygen level will be monitored until you leave the hospital or clinic. Your blood may be tested to see how you have responded to the transfusion. You may be warmed with fluids or blankets to maintain a normal body temperature. If you receive your blood transfusion in an outpatient setting, you will be told whom to contact to report any reactions. Where to find more information Visit the [employer]: redcross.org Summary A blood transfusion is a procedure in which you receive blood or a type of blood cell (blood component) through an IV. The blood given in a transfusion may be made up of different blood components. You may receive red blood cells, platelets, plasma, or white blood cells depending on the condition treated. Your temperature, blood pressure, and pulse will be monitored before, during, and after the transfusion. After the transfusion, your blood may be tested to see how your body has responded. This information is not intended to replace advice given to you by your health care provider. Make sure you discuss any questions you have with your health care provider. Document Revised: 02/28/2022 Document Reviewed: 02/28/2022 Elsevier Patient Education  2023 [employer]  

## 2023-02-06 ENCOUNTER — Ambulatory Visit (HOSPITAL_COMMUNITY)
Admission: RE | Admit: 2023-02-06 | Discharge: 2023-02-06 | Disposition: A | Discharge status: Home / Self Care | Payer: Medicare Other | Source: Ambulatory Visit | Attending: Radiation Oncology | Admitting: Radiation Oncology

## 2023-02-06 ENCOUNTER — Other Ambulatory Visit (HOSPITAL_COMMUNITY): Payer: Self-pay | Admitting: Radiation Oncology

## 2023-02-06 DIAGNOSIS — C3490 Malignant neoplasm of unspecified part of unspecified bronchus or lung: Secondary | ICD-10-CM | POA: Insufficient documentation

## 2023-02-06 DIAGNOSIS — G9389 Other specified disorders of brain: Secondary | ICD-10-CM | POA: Diagnosis not present

## 2023-02-06 LAB — TYPE AND SCREEN
ABO/RH(D): B POS
Antibody Screen: NEGATIVE
Unit division: 0

## 2023-02-06 LAB — BPAM RBC
Blood Product Expiration Date: 202403082359
ISSUE DATE / TIME: 202402220808
Unit Type and Rh: 7300

## 2023-02-06 MED ORDER — GADOBUTROL 1 MMOL/ML IV SOLN
10.0000 mL | Freq: Once | INTRAVENOUS | Status: AC | PRN
  Administered 2023-02-06: 10 mL via INTRAVENOUS

## 2023-02-06 MED ORDER — HEPARIN SOD (PORK) LOCK FLUSH 100 UNIT/ML IV SOLN
500.0000 [IU] | INTRAVENOUS | Status: AC | PRN
  Administered 2023-02-06: 500 [IU]

## 2023-02-09 ENCOUNTER — Inpatient Hospital Stay: Payer: Medicare Other

## 2023-02-09 NOTE — Therapy (Signed)
OUTPATIENT PHYSICAL THERAPY THORACOLUMBAR TREATMENT   Patient Name: Adam Wise MRN: [id number] DOB:19-Apr-1951, 72 y.o., male Today's Date: 02/11/2023  END OF SESSION:  PT End of Session - 02/10/23 1645     Visit Number 5    Number of Visits 16    Date for PT Re-Evaluation 03/23/23    Authorization Type 5/10 eval 01/26/23    PT Start Time 1645    PT Stop Time 1729    PT Time Calculation (min) 44 min    Activity Tolerance Patient limited by pain    Behavior During Therapy [facility] for tasks assessed/performed                 Past Medical History:  Diagnosis Date   Chickenpox    Chronic knee pain    Chronic low back pain    COPD exacerbation ([place]) 10/11/2021   COPD with exacerbation ([place]) 10/12/2021   Coronary artery calcification seen on CAT scan 11/2021   Coronary CTA 11/27/2021: Moderate to large right pleural effusion and compressive atelectasis right lung base. => Coronary Calcium Score 657.  Diffuse RCA calcification.  Minimal mild disease in the LAD and diagonal branches. == Overall limited study.  Notable artifact.   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 He has disease progression after the first line of chemotherapy in December 2021.   Testosterone deficiency    Type 2 diabetes mellitus ([place])    Past Surgical History:  Procedure Laterality Date   CHEST TUBE INSERTION Right 01/20/2022   Procedure: CHEST TUBE INSERTION;  Surgeon: Candee Furbish, MD;  Location: [facility] ENDOSCOPY;  Service: Pulmonary;  Laterality: Right;  w/ Talc Pleurodesis, planned admit for Obs  afterwards   CHEST TUBE INSERTION Right 04/28/2022   Procedure: INSERTION PLEURAL DRAINAGE CATHETER - Pigtail tail, drainage;  Surgeon: Candee Furbish, MD;  Location: [facility] ENDOSCOPY;  Service: Pulmonary;  Laterality: Right;  talc pleurodesis   CHEST TUBE INSERTION Right 09/01/2022   Procedure: INSERTION PLEURAL DRAINAGE CATHETER;  Surgeon: Juanito Doom, MD;  Location: [place];  Service: Cardiopulmonary;  Laterality: Right;  pigtail catheter placement with talc pleurodesis   COLONOSCOPY WITH PROPOFOL N/A 12/17/2018   Procedure: COLONOSCOPY WITH PROPOFOL;  Surgeon: Virgel Manifold, MD;  Location: [facility] ENDOSCOPY;  Service: Gastroenterology;  Laterality: N/A;   IR IMAGING GUIDED PORT INSERTION  04/17/2020   IR THORACENTESIS ASP PLEURAL SPACE W/IMG GUIDE  11/29/2021   IR THORACENTESIS ASP PLEURAL SPACE W/IMG GUIDE  01/03/2022   JOINT REPLACEMENT Bilateral    REPLACEMENT TOTAL KNEE BILATERAL  2015   TALC PLEURODESIS  09/01/2022   Procedure: Pietro Cassis;  Surgeon: Juanito Doom, MD;  Location: [facility] ENDOSCOPY;  Service: Cardiopulmonary;;   [place] ECHOCARDIOGRAM  05/2020   a) 05/2020: EF 55 to 60%.  No R WMA.  Mild LVH.  Indeterminate LVEDP.  Unable to assess RVP.  Normal aortic and mitral valves.  Mildly elevated RAP.; b) 09/2021: EF 50-55%. No RWMA. Mild LVH. ~ LVEDP. Mild LA Dilation. NORMAL RV/RAP.  Normal MV/AoV.   Patient Active Problem List  Diagnosis Date Noted   Hip pain 12/09/2022   Dizziness 11/12/2022   Chronic respiratory failure with hypoxia (HCC) 09/18/2022   Pneumothorax 09/02/2022   Pneumothorax on right 09/01/2022   Recurrent pleural effusion 08/27/2022   Recurrent pleural effusion on right 01/16/2022   Preop cardiovascular exam 12/19/2021   Coronary Calcium Score 657 11/13/2021   Atypical chest pain 11/13/2021   Pleural effusion 10/29/2021   Acute on chronic respiratory failure with hypoxia ([place]) 10/12/2021   Recurrent right  pleural effusion 08/08/2021   Erectile dysfunction 10/26/2020   Port-A-Cath in place 06/11/2020   History of pulmonary embolism 05/15/2020   Small cell lung cancer, right upper lobe ([place]) 04/12/2020   Chronic fatigue 04/12/2020   Encounter for antineoplastic chemotherapy 04/12/2020   Encounter for antineoplastic immunotherapy 04/12/2020   Malignant neoplasm of upper lobe of right lung ([place]) 04/04/2020   Goals of care, counseling/discussion 03/30/2020   Exertional dyspnea 03/26/2020   Hyperlipidemia associated with type 2 diabetes mellitus ([place]) 11/08/2019   Joint swelling 11/16/2018   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   Essential hypertension 09/09/2017   Type 2 diabetes mellitus ([place]) 09/09/2017   GERD (gastroesophageal reflux disease) 09/09/2017   Testosterone deficiency 09/09/2017   OSA (obstructive sleep apnea) 09/09/2017   Chronic bilateral low back pain without sciatica 09/09/2017   H/O knee surgery 04/27/2014   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 Has been to the dentist prior to PT session.   6/10 pain reported today.  PERTINENT HISTORY:  Patient is a very pleasant 72 year old male presenting to physical therapy for chronic low back pain without sciatica. Patient has been seen by this therapist in the past. Patient additionally has relapsed extensive stage  small cell lung cancer and now multiple metastatic liver lesions diagnosed in April 2021 with history of radiotherapy and chemotherapy. PMH includes chickenpox COPD, CAD, HTN, GERD, OSA, type II DM, bilateral TKA. Patient tried figure four and stomach pressing up. Also having vertigo.   PAIN:  Are you having  pain? 7/10 pain Yes: NPRS scale: worst pain: 10/10; least amount of pain 4/10 current pain 6-7/10 Pain location: R sided low back pain radiating to back of knee; sometimes feeling like wet pants of adduction. Pain description: radiating to back pain Aggravating factors: getting up,  Relieving factors: laying down in bed, bending over the sink  PRECAUTIONS: Other: back, cancer   WEIGHT BEARING RESTRICTIONS: No  FALLS:  Has patient fallen in last 6 months? No  LIVING ENVIRONMENT: Lives with: lives with their spouse Lives in: House/apartment Stairs: Yes: Internal: flight steps; on right going up and on left going up and External: 5 steps; on right going up and on left going up Has following equipment at home: Single point cane, Environmental consultant - 4 wheeled, Electronics engineer, and Grab bars  OCCUPATION: retired IT trainer  PLOF: Independent  PATIENT GOALS: to have less pain  NEXT MD VISIT: n/a  OBJECTIVE:   DIAGNOSTIC FINDINGS:  MR lumbar 2020 Advanced disc degeneration T12-L1 with mild spinal stenosis   Mild spinal stenosis L2-3   Moderate spinal stenosis L3-4 and L4-5  PATIENT SURVEYS:  FOTO 36  SCREENING FOR RED FLAGS: Bowel or bladder incontinence: No Spinal tumors: No Cauda equina syndrome: No Compression fracture: No Abdominal aneurysm: No  COGNITION: Overall cognitive status: Within functional limits for tasks assessed     SENSATION: Hot/Cold: Impaired R medial knee  MUSCLE LENGTH: Hamstrings: Right 25 deg; Left 40 deg   POSTURE: rounded shoulders, increased thoracic kyphosis, posterior pelvic tilt, and flexed trunk   PALPATION: Excessive muscle guarding  along lumbar paraspinals and piriformis   LUMBAR ROM:   AROM eval  Flexion limited  Extension limited  Right lateral flexion limited  Left lateral flexion limited  Right rotation limited  Left rotation limited   (Blank rows = not tested)    LOWER EXTREMITY MMT:    MMT Right eval Left eval  Hip flexion 2+* 3  Hip extension    Hip abduction 3* 3  Hip adduction 3-* 3  Knee flexion 3-* 3  Knee extension 3 3  Ankle dorsiflexion 4 4  Ankle plantarflexion 4 4   (Blank rows = not tested)  LUMBAR SPECIAL TESTS:  Straight leg raise test: Positive, SI Compression/distraction test: Positive, FABER test: Negative, and scour negative; distraction positive   FUNCTIONAL TESTS:  5 times sit to stand: unable to perform due to pain 10 meter walk test: unable to perform due to pain Sit to stand: CGA due to extreme pain Supine,<>prone; slow, extra time due to pain  GAIT: Distance walked: 3 ft Assistive device utilized: Single point cane Level of assistance: CGA Comments: severe pain with walking  TODAY'S TREATMENT:                                                                                                                              DATE: 02/11/23    TherEx:  Hooklying lower trunk rotation, 2x10 TrA activation 10x  3 second holds with cue for exhale  Sidelying hip flexor stretch 30 seconds Sidelying IT band stretch 30 seconds  Manual:  Hamstring lengthening stretch 60 seconds each LE; x 2 trials Single knee to chest 60 seconds each LE  Piriformis stretch 60 seconds each LE x 2 trials  Nerve glides with SLR/ df/pf 20x each LE Supine ITB lengthening stretch, 60 sec bouts each LE, x3 trials IT band, left paraspinal and quadratus lumborum roller x8 minutes  Ultrasound  Ultrasound:1.5 cm squared with 3 MHzx 8 minutes to L glenohumeral joint.       PATIENT EDUCATION:  Education details: POC, anterior pelvic stretch Person educated: Patient and Spouse Education  method: Explanation, Demonstration, Tactile cues, and Verbal cues Education comprehension: verbalized understanding, returned demonstration, verbal cues required, and tactile cues required  HOME EXERCISE PROGRAM: Anterior pelvic tilt 10x Hamstring stretch 30 seconds Lay prone 60 seconds   Access Code: [email or web address] URL: [email or web address] Date: 01/29/2023 Prepared by: Janna Arch  Exercises - Seated Anterior Pelvic Tilt  - 1 x daily - 7 x weekly - 2 sets - 10 reps - 5 hold - Supine Anterior Pelvic Tilt  - 1 x daily - 7 x weekly - 2 sets - 10 reps - 5 hold - Seated Hamstring Stretch  - 1 x daily - 7 x weekly - 2 sets - 2 reps - 30 hold - Supine Transversus Abdominis Bracing - Hands on Ground  - 1 x daily - 7 x weekly - 2 sets - 10 reps - 5 hold  ASSESSMENT:  CLINICAL IMPRESSION:  Patient has significant pain this session requiring regression to primarily manual and introduction to ultrasound. Patient does report some relief by end of session, with improved transfer to sitting and to transport chair this session. Encouragement of ambulation between sessions performed. Pt will continue to benefit from skilled therapy to address remaining deficits in order to improve overall QoL and return to PLOF.      OBJECTIVE IMPAIRMENTS: Abnormal gait, cardiopulmonary status limiting activity, decreased activity tolerance, decreased balance, decreased endurance, decreased mobility, difficulty walking, decreased ROM, decreased strength, hypomobility, impaired perceived functional ability, increased muscle spasms, impaired flexibility, impaired sensation, improper body mechanics, postural dysfunction, obesity, and pain.   ACTIVITY LIMITATIONS: carrying, lifting, bending, sitting, standing, squatting, sleeping, stairs, transfers, bed mobility, bathing, toileting, dressing, reach over head, hygiene/grooming, and locomotion level  PARTICIPATION LIMITATIONS: meal prep, cleaning, laundry,  medication management, personal finances, interpersonal relationship, driving, shopping, community activity, occupation, yard work, and church  PERSONAL FACTORS: Age, Behavior pattern, Education, Restaurant manager, fast food, Past/current experiences, Profession, Sex, Social background, Time since onset of injury/illness/exacerbation, and 3+ comorbidities: chickenpox COPD, CAD, HTN, GERD, OSA, type II DM, bilateral TKA  are also affecting patient's functional outcome.   REHAB POTENTIAL: Good  CLINICAL DECISION MAKING: Evolving/moderate complexity  EVALUATION COMPLEXITY: Moderate   GOALS: Goals reviewed with patient? Yes  SHORT TERM GOALS: Target date: 02/23/2023  Patient will be independent in home exercise program to improve strength/mobility for better functional independence with ADLs. Baseline: 2/12: given to patient Goal status: INITIAL  2.  .  Patient will report a worst pain of  5/10 on VAS in low back to improve tolerance with ADLs and reduced symptoms with activities. Baseline: 2/12: 10/10 Goal status: INITIAL    LONG TERM GOALS: Target date: 03/23/2023  Patient will increase FOTO score to equal to or greater than 48%    to demonstrate statistically significant improvement in mobility and quality of life.  Baseline: 2/12:  36% Goal status: INITIAL  2.  Patient will report a worst pain of 3/10 on VAS in low back to improve tolerance with ADLs and reduced symptoms with activities.  Baseline: 2/12: 10/10 Goal status: INITIAL  3.  Patient will increase 10 meter walk test to >1.74ms as to improve gait speed for better community ambulation and to reduce fall risk Baseline: 2/12: unable to ambulate 10 meters Goal status: INITIAL  4.  Patient (> 668years old) will complete five times sit to stand test in < 15 seconds indicating an increased LE strength and improved balance. Baseline: 2/12: unable to perform due to severe pain Goal status: INITIAL    PLAN:  PT FREQUENCY: 2x/week  PT  DURATION: 8 weeks  PLANNED INTERVENTIONS: Therapeutic exercises, Therapeutic activity, Neuromuscular re-education, Balance training, Gait training, Patient/Family education, Self Care, Joint mobilization, Stair training, Vestibular training, Canalith repositioning, Orthotic/Fit training, DME instructions, Electrical stimulation, Spinal manipulation, Spinal mobilization, Cryotherapy, Moist heat, Splintting, Taping, Vasopneumatic device, Traction, Ultrasound, Manual therapy, and Re-evaluation.  PLAN FOR NEXT SESSION:   anterior tilting, reducing muscle tension, core and LE strengthening.     MJanna ArchPT  Physical Therapist - [facility] 02/11/23, 7:03 AM

## 2023-02-10 ENCOUNTER — Ambulatory Visit: Payer: Medicare Other

## 2023-02-10 DIAGNOSIS — M545 Low back pain, unspecified: Secondary | ICD-10-CM | POA: Diagnosis not present

## 2023-02-10 DIAGNOSIS — G8929 Other chronic pain: Secondary | ICD-10-CM | POA: Diagnosis not present

## 2023-02-10 DIAGNOSIS — M5459 Other low back pain: Secondary | ICD-10-CM

## 2023-02-10 DIAGNOSIS — R262 Difficulty in walking, not elsewhere classified: Secondary | ICD-10-CM | POA: Diagnosis not present

## 2023-02-10 DIAGNOSIS — M6281 Muscle weakness (generalized): Secondary | ICD-10-CM | POA: Diagnosis not present

## 2023-02-11 ENCOUNTER — Other Ambulatory Visit: Payer: Self-pay | Admitting: Physician Assistant

## 2023-02-11 ENCOUNTER — Inpatient Hospital Stay: Payer: Medicare Other

## 2023-02-11 ENCOUNTER — Ambulatory Visit: Payer: Medicare Other

## 2023-02-11 DIAGNOSIS — Z95828 Presence of other vascular implants and grafts: Secondary | ICD-10-CM | POA: Diagnosis not present

## 2023-02-11 DIAGNOSIS — C3490 Malignant neoplasm of unspecified part of unspecified bronchus or lung: Secondary | ICD-10-CM | POA: Diagnosis not present

## 2023-02-11 DIAGNOSIS — C3411 Malignant neoplasm of upper lobe, right bronchus or lung: Secondary | ICD-10-CM | POA: Diagnosis not present

## 2023-02-11 DIAGNOSIS — D6481 Anemia due to antineoplastic chemotherapy: Secondary | ICD-10-CM

## 2023-02-11 DIAGNOSIS — D649 Anemia, unspecified: Secondary | ICD-10-CM | POA: Diagnosis not present

## 2023-02-11 LAB — CMP (CANCER CENTER ONLY)
ALT: 15 U/L (ref 0–44)
AST: 19 U/L (ref 15–41)
Albumin: 3.7 g/dL (ref 3.5–5.0)
Alkaline Phosphatase: 54 U/L (ref 38–126)
Anion gap: 9 (ref 5–15)
BUN: 15 mg/dL (ref 8–23)
CO2: 27 mmol/L (ref 22–32)
Calcium: 7.6 mg/dL — ABNORMAL LOW (ref 8.9–10.3)
Chloride: 104 mmol/L (ref 98–111)
Creatinine: 1.08 mg/dL (ref 0.61–1.24)
GFR, Estimated: >60 mL/min (ref >60)
Glucose, Bld: 121 mg/dL — ABNORMAL HIGH (ref 70–99)
Potassium: 4.2 mmol/L (ref 3.5–5.1)
Sodium: 140 mmol/L (ref 135–145)
Total Bilirubin: 0.4 mg/dL (ref 0.3–1.2)
Total Protein: 5.8 g/dL — ABNORMAL LOW (ref 6.5–8.1)

## 2023-02-11 LAB — CBC WITH DIFFERENTIAL (CANCER CENTER ONLY)
Abs Immature Granulocytes: 0.02 10*3/uL (ref 0.00–0.07)
Basophils Absolute: 0 10*3/uL (ref 0.0–0.1)
Basophils Relative: 1 %
Eosinophils Absolute: 0 10*3/uL (ref 0.0–0.5)
Eosinophils Relative: 2 %
HCT: 24.3 % — ABNORMAL LOW (ref 39.0–52.0)
Hemoglobin: 8.1 g/dL — ABNORMAL LOW (ref 13.0–17.0)
Immature Granulocytes: 1 %
Lymphocytes Relative: 15 %
Lymphs Abs: 0.3 10*3/uL — ABNORMAL LOW (ref 0.7–4.0)
MCH: 32.3 pg (ref 26.0–34.0)
MCHC: 33.3 g/dL (ref 30.0–36.0)
MCV: 96.8 fL (ref 80.0–100.0)
Monocytes Absolute: 0.4 10*3/uL (ref 0.1–1.0)
Monocytes Relative: 23 %
Neutro Abs: 1 10*3/uL — ABNORMAL LOW (ref 1.7–7.7)
Neutrophils Relative %: 58 %
Platelet Count: 57 10*3/uL — ABNORMAL LOW (ref 150–400)
RBC: 2.51 MIL/uL — ABNORMAL LOW (ref 4.22–5.81)
RDW: 16.3 % — ABNORMAL HIGH (ref 11.5–15.5)
WBC Count: 1.8 10*3/uL — ABNORMAL LOW (ref 4.0–10.5)
nRBC: 1.1 % — ABNORMAL HIGH (ref 0.0–0.2)

## 2023-02-11 MED ORDER — HEPARIN SOD (PORK) LOCK FLUSH 100 UNIT/ML IV SOLN
500.0000 [IU] | Freq: Once | INTRAVENOUS | Status: AC
  Administered 2023-02-11: 500 [IU]

## 2023-02-11 MED ORDER — SODIUM CHLORIDE 0.9% FLUSH
10.0000 mL | Freq: Once | INTRAVENOUS | Status: AC
  Administered 2023-02-11: 10 mL

## 2023-02-11 NOTE — Therapy (Incomplete)
OUTPATIENT PHYSICAL THERAPY THORACOLUMBAR TREATMENT   Patient Name: Adam Wise MRN: [id number] DOB:March 14, 1951, 72 y.o., male Today's Date: 02/11/2023  END OF SESSION:        Past Medical History:  Diagnosis Date   Chickenpox    Chronic knee pain    Chronic low back pain    COPD exacerbation ([place]) 10/11/2021   COPD with exacerbation ([place]) 10/12/2021   Coronary artery calcification seen on CAT scan 11/2021   Coronary CTA 11/27/2021: Moderate to large right pleural effusion and compressive atelectasis right lung base. => Coronary Calcium Score 657.  Diffuse RCA calcification.  Minimal mild disease in the LAD and diagonal branches. == Overall limited study.  Notable artifact.   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 He has disease progression after the first line of chemotherapy in December 2021.   Testosterone deficiency    Type 2 diabetes mellitus ([place])    Past Surgical History:  Procedure Laterality Date   CHEST TUBE INSERTION Right 01/20/2022   Procedure: CHEST TUBE INSERTION;  Surgeon: Candee Furbish, MD;  Location: [facility] ENDOSCOPY;  Service: Pulmonary;  Laterality: Right;  w/ Talc Pleurodesis, planned admit for Obs afterwards   CHEST TUBE INSERTION Right 04/28/2022   Procedure: INSERTION PLEURAL DRAINAGE CATHETER - Pigtail tail, drainage;  Surgeon: Candee Furbish, MD;  Location: [facility] ENDOSCOPY;  Service: Pulmonary;  Laterality: Right;  talc pleurodesis   CHEST TUBE INSERTION Right 09/01/2022   Procedure: INSERTION PLEURAL DRAINAGE CATHETER;  Surgeon: Juanito Doom, MD;   Location: [place];  Service: Cardiopulmonary;  Laterality: Right;  pigtail catheter placement with talc pleurodesis   COLONOSCOPY WITH PROPOFOL N/A 12/17/2018   Procedure: COLONOSCOPY WITH PROPOFOL;  Surgeon: Virgel Manifold, MD;  Location: [facility] ENDOSCOPY;  Service: Gastroenterology;  Laterality: N/A;   IR IMAGING GUIDED PORT INSERTION  04/17/2020   IR THORACENTESIS ASP PLEURAL SPACE W/IMG GUIDE  11/29/2021   IR THORACENTESIS ASP PLEURAL SPACE W/IMG GUIDE  01/03/2022   JOINT REPLACEMENT Bilateral    REPLACEMENT TOTAL KNEE BILATERAL  2015   TALC PLEURODESIS  09/01/2022   Procedure: Pietro Cassis;  Surgeon: Juanito Doom, MD;  Location: [facility] ENDOSCOPY;  Service: Cardiopulmonary;;   [place] ECHOCARDIOGRAM  05/2020   a) 05/2020: EF 55 to 60%.  No R WMA.  Mild LVH.  Indeterminate LVEDP.  Unable to assess RVP.  Normal aortic and mitral valves.  Mildly elevated RAP.; b) 09/2021: EF 50-55%. No RWMA. Mild LVH. ~ LVEDP. Mild LA Dilation. NORMAL RV/RAP.  Normal MV/AoV.   Patient Active Problem List   Diagnosis Date Noted   Hip pain 12/09/2022   Dizziness 11/12/2022   Chronic respiratory failure with hypoxia (HCC) 09/18/2022   Pneumothorax 09/02/2022   Pneumothorax on right 09/01/2022   Recurrent pleural effusion 08/27/2022   Recurrent pleural effusion on right 01/16/2022   Preop cardiovascular exam 12/19/2021   Coronary Calcium Score 657 11/13/2021   Atypical chest pain 11/13/2021   Pleural effusion 10/29/2021   Acute on chronic respiratory failure with hypoxia ([place]) 10/12/2021   Recurrent right pleural effusion 08/08/2021  Erectile dysfunction 10/26/2020   Port-A-Cath in place 06/11/2020   History of pulmonary embolism 05/15/2020   Small cell lung cancer, right upper lobe ([place]) 04/12/2020   Chronic fatigue 04/12/2020   Encounter for antineoplastic chemotherapy 04/12/2020   Encounter for antineoplastic immunotherapy 04/12/2020   Malignant neoplasm of  upper lobe of right lung ([place]) 04/04/2020   Goals of care, counseling/discussion 03/30/2020   Exertional dyspnea 03/26/2020   Hyperlipidemia associated with type 2 diabetes mellitus ([place]) 11/08/2019   Joint swelling 11/16/2018   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  ONSET DATE: December 2023  SUBJECTIVE:                                                                                                                                                                                           SUBJECTIVE STATEMENT: Patient reports having a high pain day today. Has been to the dentist prior to PT session.   6/10 pain reported today.  PERTINENT HISTORY:  Patient is a very pleasant 72 year old male presenting to physical therapy for chronic low back pain without sciatica. Patient has been seen by this therapist in the past. Patient additionally has relapsed extensive stage small cell lung cancer and now multiple metastatic liver lesions diagnosed in April 2021 with history of radiotherapy and chemotherapy. PMH includes chickenpox COPD, CAD, HTN, GERD, OSA, type II DM, bilateral TKA. Patient tried figure four and stomach pressing up. Also having vertigo.   PAIN:  Are you having pain? 7/10 pain Yes: NPRS scale: worst pain: 10/10; least amount of pain 4/10 current pain 6-7/10 Pain location: R sided low back pain  radiating to back of knee; sometimes feeling like wet pants of adduction. Pain description: radiating to back pain Aggravating factors: getting up,  Relieving factors: laying down in bed, bending over the sink  PRECAUTIONS: Other: back, cancer   WEIGHT BEARING RESTRICTIONS: No  FALLS:  Has patient fallen in last 6 months? No  LIVING ENVIRONMENT: Lives with: lives with their spouse Lives in: House/apartment Stairs: Yes: Internal: flight steps; on right going up and  on left going up and External: 5 steps; on right going up and on left going up Has following equipment at home: Single point cane, Walker - 4 wheeled, Electronics engineer, and Grab bars  OCCUPATION: retired IT trainer  PLOF: Independent  PATIENT GOALS: to have less pain  NEXT MD VISIT: n/a  OBJECTIVE:   DIAGNOSTIC FINDINGS:  MR lumbar 2020 Advanced disc degeneration T12-L1 with mild spinal stenosis   Mild spinal stenosis L2-3   Moderate spinal stenosis L3-4 and L4-5  PATIENT SURVEYS:  FOTO 36  SCREENING FOR RED FLAGS: Bowel or bladder incontinence: No Spinal tumors: No Cauda equina syndrome: No Compression fracture: No Abdominal aneurysm: No  COGNITION: Overall cognitive status: Within functional limits for tasks assessed     SENSATION: Hot/Cold: Impaired R medial knee  MUSCLE LENGTH: Hamstrings: Right 25 deg; Left 40 deg   POSTURE: rounded shoulders, increased thoracic kyphosis, posterior pelvic tilt, and flexed trunk   PALPATION: Excessive muscle guarding along lumbar paraspinals and piriformis   LUMBAR ROM:   AROM eval  Flexion limited  Extension limited  Right lateral flexion limited  Left lateral flexion limited  Right rotation limited  Left rotation limited   (Blank rows = not tested)    LOWER EXTREMITY MMT:    MMT Right eval Left eval  Hip flexion 2+* 3  Hip extension    Hip abduction 3* 3  Hip adduction 3-* 3  Knee flexion 3-* 3  Knee extension 3 3  Ankle dorsiflexion 4  4  Ankle plantarflexion 4 4   (Blank rows = not tested)  LUMBAR SPECIAL TESTS:  Straight leg raise test: Positive, SI Compression/distraction test: Positive, FABER test: Negative, and scour negative; distraction positive   FUNCTIONAL TESTS:  5 times sit to stand: unable to perform due to pain 10 meter walk test: unable to perform due to pain Sit to stand: CGA due to extreme pain Supine,<>prone; slow, extra time due to pain  GAIT: Distance walked: 3 ft Assistive device utilized: Single point cane Level of assistance: CGA Comments: severe pain with walking  TODAY'S TREATMENT:                                                                                                                              DATE: 02/11/23    TherEx:  Hooklying lower trunk rotation, 2x10 TrA activation 10x 3 second holds with cue for exhale  Sidelying hip flexor stretch 30 seconds Sidelying IT band stretch 30 seconds  Manual:  Hamstring lengthening stretch 60 seconds each LE; x 2 trials Single knee to chest 60 seconds each LE  Piriformis stretch 60 seconds each LE x 2 trials  Nerve glides with SLR/ df/pf 20x each LE Supine ITB lengthening stretch, 60 sec bouts each LE, x3 trials IT band, left paraspinal and quadratus lumborum roller x8 minutes  Ultrasound  Ultrasound:1.5 cm squared with 3 MHzx 8 minutes to L glenohumeral joint.  PATIENT EDUCATION:  Education details: POC, anterior pelvic stretch Person educated: Patient and Spouse Education method: Explanation, Demonstration, Tactile cues, and Verbal cues Education comprehension: verbalized understanding, returned demonstration, verbal cues required, and tactile cues required  HOME EXERCISE PROGRAM: Anterior pelvic tilt 10x Hamstring stretch 30 seconds Lay prone 60 seconds   Access Code: [email or web address] URL: [email or web address] Date: 01/29/2023 Prepared by: Janna Arch  Exercises - Seated Anterior Pelvic Tilt  - 1 x  daily - 7 x weekly - 2 sets - 10 reps - 5 hold - Supine Anterior Pelvic Tilt  - 1 x daily - 7 x weekly - 2 sets - 10 reps - 5 hold - Seated Hamstring Stretch  - 1 x daily - 7 x weekly - 2 sets - 2 reps - 30 hold - Supine Transversus Abdominis Bracing - Hands on Ground  - 1 x daily - 7 x weekly - 2 sets - 10 reps - 5 hold  ASSESSMENT:  CLINICAL IMPRESSION:  Patient has significant pain this session requiring regression to primarily manual and introduction to ultrasound. Patient does report some relief by end of session, with improved transfer to sitting and to transport chair this session. Encouragement of ambulation between sessions performed. Pt will continue to benefit from skilled therapy to address remaining deficits in order to improve overall QoL and return to PLOF.      OBJECTIVE IMPAIRMENTS: Abnormal gait, cardiopulmonary status limiting activity, decreased activity tolerance, decreased balance, decreased endurance, decreased mobility, difficulty walking, decreased ROM, decreased strength, hypomobility, impaired perceived functional ability, increased muscle spasms, impaired flexibility, impaired sensation, improper body mechanics, postural dysfunction, obesity, and pain.   ACTIVITY LIMITATIONS: carrying, lifting, bending, sitting, standing, squatting, sleeping, stairs, transfers, bed mobility, bathing, toileting, dressing, reach over head, hygiene/grooming, and locomotion level  PARTICIPATION LIMITATIONS: meal prep, cleaning, laundry, medication management, personal finances, interpersonal relationship, driving, shopping, community activity, occupation, yard work, and church  PERSONAL FACTORS: Age, Behavior pattern, Education, Restaurant manager, fast food, Past/current experiences, Profession, Sex, Social background, Time since onset of injury/illness/exacerbation, and 3+ comorbidities: chickenpox COPD, CAD, HTN, GERD, OSA, type II DM, bilateral TKA  are also affecting patient's functional outcome.   REHAB  POTENTIAL: Good  CLINICAL DECISION MAKING: Evolving/moderate complexity  EVALUATION COMPLEXITY: Moderate   GOALS: Goals reviewed with patient? Yes  SHORT TERM GOALS: Target date: 02/23/2023  Patient will be independent in home exercise program to improve strength/mobility for better functional independence with ADLs. Baseline: 2/12: given to patient Goal status: INITIAL  2.  .  Patient will report a worst pain of  5/10 on VAS in low back to improve tolerance with ADLs and reduced symptoms with activities. Baseline: 2/12: 10/10 Goal status: INITIAL    LONG TERM GOALS: Target date: 03/23/2023  Patient will increase FOTO score to equal to or greater than 48%    to demonstrate statistically significant improvement in mobility and quality of life.  Baseline: 2/12: 36% Goal status: INITIAL  2.  Patient will report a worst pain of 3/10 on VAS in low back to improve tolerance with ADLs and reduced symptoms with activities.  Baseline: 2/12: 10/10 Goal status: INITIAL  3.  Patient will increase 10 meter walk test to >1.15ms as to improve gait speed for better community ambulation and to reduce fall risk Baseline: 2/12: unable to ambulate 10 meters Goal status: INITIAL  4.  Patient (> 676years old) will complete five times sit to stand test in < 15 seconds indicating an increased LE strength and improved balance. Baseline:  2/12: unable to perform due to severe pain Goal status: INITIAL    PLAN:  PT FREQUENCY: 2x/week  PT DURATION: 8 weeks  PLANNED INTERVENTIONS: Therapeutic exercises, Therapeutic activity, Neuromuscular re-education, Balance training, Gait training, Patient/Family education, Self Care, Joint mobilization, Stair training, Vestibular training, Canalith repositioning, Orthotic/Fit training, DME instructions, Electrical stimulation, Spinal manipulation, Spinal mobilization, Cryotherapy, Moist heat, Splintting, Taping, Vasopneumatic device, Traction, Ultrasound, Manual  therapy, and Re-evaluation.  PLAN FOR NEXT SESSION:   anterior tilting, reducing muscle tension, core and LE strengthening.     [facility]  02/11/23, 3:59 PM

## 2023-02-12 ENCOUNTER — Ambulatory Visit: Payer: Medicare Other

## 2023-02-12 ENCOUNTER — Ambulatory Visit (HOSPITAL_COMMUNITY)
Admission: RE | Admit: 2023-02-12 | Discharge: 2023-02-12 | Disposition: A | Discharge status: Home / Self Care | Payer: Medicare Other | Source: Ambulatory Visit | Attending: Internal Medicine | Admitting: Internal Medicine

## 2023-02-12 DIAGNOSIS — J90 Pleural effusion, not elsewhere classified: Secondary | ICD-10-CM | POA: Diagnosis not present

## 2023-02-12 DIAGNOSIS — R262 Difficulty in walking, not elsewhere classified: Secondary | ICD-10-CM

## 2023-02-12 DIAGNOSIS — M5459 Other low back pain: Secondary | ICD-10-CM

## 2023-02-12 DIAGNOSIS — M545 Low back pain, unspecified: Secondary | ICD-10-CM | POA: Diagnosis not present

## 2023-02-12 DIAGNOSIS — K7689 Other specified diseases of liver: Secondary | ICD-10-CM | POA: Diagnosis not present

## 2023-02-12 DIAGNOSIS — C3490 Malignant neoplasm of unspecified part of unspecified bronchus or lung: Secondary | ICD-10-CM | POA: Diagnosis not present

## 2023-02-12 DIAGNOSIS — M6281 Muscle weakness (generalized): Secondary | ICD-10-CM

## 2023-02-12 DIAGNOSIS — J479 Bronchiectasis, uncomplicated: Secondary | ICD-10-CM | POA: Diagnosis not present

## 2023-02-12 DIAGNOSIS — N281 Cyst of kidney, acquired: Secondary | ICD-10-CM | POA: Diagnosis not present

## 2023-02-12 DIAGNOSIS — G8929 Other chronic pain: Secondary | ICD-10-CM | POA: Diagnosis not present

## 2023-02-12 MED ORDER — IOHEXOL 350 MG/ML SOLN
100.0000 mL | Freq: Once | INTRAVENOUS | Status: AC | PRN
  Administered 2023-02-12: 100 mL via INTRAVENOUS

## 2023-02-12 MED ORDER — SODIUM CHLORIDE (PF) 0.9 % IJ SOLN
INTRAMUSCULAR | Status: AC
  Filled 2023-02-12: qty 50

## 2023-02-12 MED ORDER — HEPARIN SOD (PORK) LOCK FLUSH 100 UNIT/ML IV SOLN
500.0000 [IU] | Freq: Once | INTRAVENOUS | Status: AC
  Administered 2023-02-12: 500 [IU] via INTRAVENOUS

## 2023-02-12 MED ORDER — HEPARIN SOD (PORK) LOCK FLUSH 100 UNIT/ML IV SOLN
INTRAVENOUS | Status: AC
  Filled 2023-02-12: qty 5

## 2023-02-12 NOTE — Therapy (Signed)
OUTPATIENT PHYSICAL THERAPY THORACOLUMBAR TREATMENT   Patient Name: Adam Wise MRN: [id number] DOB:1951-06-24, 72 y.o., male Today's Date: 02/12/2023  END OF SESSION:  PT End of Session - 02/12/23 1304     Visit Number 6    Number of Visits 16    Date for PT Re-Evaluation 03/23/23    Authorization Type 5/10 eval 01/26/23    PT Start Time 1146    PT Stop Time 1230    PT Time Calculation (min) 44 min    Activity Tolerance Patient tolerated treatment well;No increased pain    Behavior During Therapy [facility] for tasks assessed/performed                 Past Medical History:  Diagnosis Date   Chickenpox    Chronic knee pain    Chronic low back pain    COPD exacerbation ([place]) 10/11/2021   COPD with exacerbation ([place]) 10/12/2021   Coronary artery calcification seen on CAT scan 11/2021   Coronary CTA 11/27/2021: Moderate to large right pleural effusion and compressive atelectasis right lung base. => Coronary Calcium Score 657.  Diffuse RCA calcification.  Minimal mild disease in the LAD and diagonal branches. == Overall limited study.  Notable artifact.   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 He has disease progression after the first line of chemotherapy in December 2021.   Testosterone deficiency    Type 2 diabetes mellitus ([place])    Past Surgical History:  Procedure Laterality Date   CHEST TUBE INSERTION Right 01/20/2022   Procedure: CHEST TUBE INSERTION;  Surgeon: Candee Furbish, MD;  Location: [facility] ENDOSCOPY;  Service: Pulmonary;  Laterality: Right;  w/ Talc Pleurodesis,  planned admit for Obs afterwards   CHEST TUBE INSERTION Right 04/28/2022   Procedure: INSERTION PLEURAL DRAINAGE CATHETER - Pigtail tail, drainage;  Surgeon: Candee Furbish, MD;  Location: [facility] ENDOSCOPY;  Service: Pulmonary;  Laterality: Right;  talc pleurodesis   CHEST TUBE INSERTION Right 09/01/2022   Procedure: INSERTION PLEURAL DRAINAGE CATHETER;  Surgeon: Juanito Doom, MD;  Location: [place];  Service: Cardiopulmonary;  Laterality: Right;  pigtail catheter placement with talc pleurodesis   COLONOSCOPY WITH PROPOFOL N/A 12/17/2018   Procedure: COLONOSCOPY WITH PROPOFOL;  Surgeon: Virgel Manifold, MD;  Location: [facility] ENDOSCOPY;  Service: Gastroenterology;  Laterality: N/A;   IR IMAGING GUIDED PORT INSERTION  04/17/2020   IR THORACENTESIS ASP PLEURAL SPACE W/IMG GUIDE  11/29/2021   IR THORACENTESIS ASP PLEURAL SPACE W/IMG GUIDE  01/03/2022   JOINT REPLACEMENT Bilateral    REPLACEMENT TOTAL KNEE BILATERAL  2015   TALC PLEURODESIS  09/01/2022   Procedure: Pietro Cassis;  Surgeon: Juanito Doom, MD;  Location: [facility] ENDOSCOPY;  Service: Cardiopulmonary;;   [place] ECHOCARDIOGRAM  05/2020   a) 05/2020: EF 55 to 60%.  No R WMA.  Mild LVH.  Indeterminate LVEDP.  Unable to assess RVP.  Normal aortic and mitral valves.  Mildly elevated RAP.; b) 09/2021: EF 50-55%. No RWMA. Mild LVH. ~ LVEDP. Mild LA Dilation. NORMAL RV/RAP.  Normal MV/AoV.   Patient Active Problem  List   Diagnosis Date Noted   Hip pain 12/09/2022   Dizziness 11/12/2022   Chronic respiratory failure with hypoxia (HCC) 09/18/2022   Pneumothorax 09/02/2022   Pneumothorax on right 09/01/2022   Recurrent pleural effusion 08/27/2022   Recurrent pleural effusion on right 01/16/2022   Preop cardiovascular exam 12/19/2021   Coronary Calcium Score 657 11/13/2021   Atypical chest pain 11/13/2021   Pleural effusion 10/29/2021   Acute on chronic respiratory failure with hypoxia ([place]) 10/12/2021    Recurrent right pleural effusion 08/08/2021   Erectile dysfunction 10/26/2020   Port-A-Cath in place 06/11/2020   History of pulmonary embolism 05/15/2020   Small cell lung cancer, right upper lobe ([place]) 04/12/2020   Chronic fatigue 04/12/2020   Encounter for antineoplastic chemotherapy 04/12/2020   Encounter for antineoplastic immunotherapy 04/12/2020   Malignant neoplasm of upper lobe of right lung ([place]) 04/04/2020   Goals of care, counseling/discussion 03/30/2020   Exertional dyspnea 03/26/2020   Hyperlipidemia associated with type 2 diabetes mellitus ([place]) 11/08/2019   Joint swelling 11/16/2018   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   Essential hypertension 09/09/2017   Type 2 diabetes mellitus ([place]) 09/09/2017   GERD (gastroesophageal reflux disease) 09/09/2017   Testosterone deficiency 09/09/2017   OSA (obstructive sleep apnea) 09/09/2017   Chronic bilateral low back pain without sciatica 09/09/2017   H/O knee surgery 04/27/2014   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    PCP: [name] NP   REFERRING PROVIDER: [name] NP   REFERRING DIAG: chronic bilateral low back pain without sciatica   Rationale for Evaluation and Treatment: Rehabilitation  THERAPY DIAG:  Other low back pain  Muscle weakness (generalized)  Difficulty in walking, not elsewhere classified  ONSET DATE: December 2023  SUBJECTIVE:                                                                                                                                                                                           SUBJECTIVE STATEMENT: Pt reports he is somewhat improved today, back feeling slightly better. Rates pain as 5/10 currently.   PERTINENT HISTORY:  Patient is a very pleasant 72 year old male presenting to physical therapy for chronic low back pain without sciatica. Patient has been seen by this therapist in the past. Patient additionally has relapsed extensive  stage small cell lung cancer and now multiple metastatic liver lesions diagnosed in April 2021 with history of radiotherapy and chemotherapy. PMH includes chickenpox COPD, CAD, HTN, GERD, OSA, type II DM, bilateral TKA. Patient tried figure four and stomach pressing up. Also having vertigo.   PAIN:  Are you having pain? 7/10 pain  Yes: NPRS scale: worst pain: 10/10; least amount of pain 4/10 current pain 6-7/10 Pain location: R sided low back pain radiating to back of knee; sometimes feeling like wet pants of adduction. Pain description: radiating to back pain Aggravating factors: getting up,  Relieving factors: laying down in bed, bending over the sink  PRECAUTIONS: Other: back, cancer   WEIGHT BEARING RESTRICTIONS: No  FALLS:  Has patient fallen in last 6 months? No  LIVING ENVIRONMENT: Lives with: lives with their spouse Lives in: House/apartment Stairs: Yes: Internal: flight steps; on right going up and on left going up and External: 5 steps; on right going up and on left going up Has following equipment at home: Single point cane, Environmental consultant - 4 wheeled, Electronics engineer, and Grab bars  OCCUPATION: retired IT trainer  PLOF: Independent  PATIENT GOALS: to have less pain  NEXT MD VISIT: n/a  OBJECTIVE:   DIAGNOSTIC FINDINGS:  MR lumbar 2020 Advanced disc degeneration T12-L1 with mild spinal stenosis   Mild spinal stenosis L2-3   Moderate spinal stenosis L3-4 and L4-5  PATIENT SURVEYS:  FOTO 36  SCREENING FOR RED FLAGS: Bowel or bladder incontinence: No Spinal tumors: No Cauda equina syndrome: No Compression fracture: No Abdominal aneurysm: No  COGNITION: Overall cognitive status: Within functional limits for tasks assessed     SENSATION: Hot/Cold: Impaired R medial knee  MUSCLE LENGTH: Hamstrings: Right 25 deg; Left 40 deg   POSTURE: rounded shoulders, increased thoracic kyphosis, posterior pelvic tilt, and flexed trunk   PALPATION: Excessive muscle  guarding along lumbar paraspinals and piriformis   LUMBAR ROM:   AROM eval  Flexion limited  Extension limited  Right lateral flexion limited  Left lateral flexion limited  Right rotation limited  Left rotation limited   (Blank rows = not tested)    LOWER EXTREMITY MMT:    MMT Right eval Left eval  Hip flexion 2+* 3  Hip extension    Hip abduction 3* 3  Hip adduction 3-* 3  Knee flexion 3-* 3  Knee extension 3 3  Ankle dorsiflexion 4 4  Ankle plantarflexion 4 4   (Blank rows = not tested)  LUMBAR SPECIAL TESTS:  Straight leg raise test: Positive, SI Compression/distraction test: Positive, FABER test: Negative, and scour negative; distraction positive   FUNCTIONAL TESTS:  5 times sit to stand: unable to perform due to pain 10 meter walk test: unable to perform due to pain Sit to stand: CGA due to extreme pain Supine,<>prone; slow, extra time due to pain  GAIT: Distance walked: 3 ft Assistive device utilized: Single point cane Level of assistance: CGA Comments: severe pain with walking  TODAY'S TREATMENT:                                                                                                                              DATE: 02/12/23   On large mat table:  TherEx:  Hooklying lower trunk rotation, 2x10 Glute sets  10x with 3 sec holds  Pelvic tilts, emphasis on anterior pelvic tilt, required cuing first to PPT in order to better understand movement into anterior pelvic tilt 10x of each but hold 3 sec in APT  LTRS 10x each side Glute sets 10x with 3 sec holds  TrA activation 10x 3 second holds with cue for exhale/breathing technique   TrA activation with heel slide alt LE 10x. Challenging but no pain Adductor isometrics with red p.ball 10x 5 sec hold Open book 10x each side    Manual: Hip flexor lengthening stretch 45 seconds each LE Side-lye roller to R ITB and lat quad x 9 minutes. Pt reports intervention feels good, particularly just proximal  to patella        PATIENT EDUCATION:  Education details: POC, anterior pelvic stretch Person educated: Patient and Spouse Education method: Explanation, Demonstration, Tactile cues, and Verbal cues Education comprehension: verbalized understanding, returned demonstration, verbal cues required, and tactile cues required  HOME EXERCISE PROGRAM: Anterior pelvic tilt 10x Hamstring stretch 30 seconds Lay prone 60 seconds   Access Code: [email or web address] URL: [email or web address] Date: 01/29/2023 Prepared by: Janna Arch  Exercises - Seated Anterior Pelvic Tilt  - 1 x daily - 7 x weekly - 2 sets - 10 reps - 5 hold - Supine Anterior Pelvic Tilt  - 1 x daily - 7 x weekly - 2 sets - 10 reps - 5 hold - Seated Hamstring Stretch  - 1 x daily - 7 x weekly - 2 sets - 2 reps - 30 hold - Supine Transversus Abdominis Bracing - Hands on Ground  - 1 x daily - 7 x weekly - 2 sets - 10 reps - 5 hold  ASSESSMENT:  CLINICAL IMPRESSION: Pt presents with improved pain and eager to participate in PT. Author continued plan as laid out in recent sessions. Pt tolerated all interventions well without pain, but does find anterior pelvic tilt challenging at this time. Pt will continue to benefit from skilled therapy to address remaining deficits in order to improve overall QoL and return to PLOF.      OBJECTIVE IMPAIRMENTS: Abnormal gait, cardiopulmonary status limiting activity, decreased activity tolerance, decreased balance, decreased endurance, decreased mobility, difficulty walking, decreased ROM, decreased strength, hypomobility, impaired perceived functional ability, increased muscle spasms, impaired flexibility, impaired sensation, improper body mechanics, postural dysfunction, obesity, and pain.   ACTIVITY LIMITATIONS: carrying, lifting, bending, sitting, standing, squatting, sleeping, stairs, transfers, bed mobility, bathing, toileting, dressing, reach over head, hygiene/grooming, and  locomotion level  PARTICIPATION LIMITATIONS: meal prep, cleaning, laundry, medication management, personal finances, interpersonal relationship, driving, shopping, community activity, occupation, yard work, and church  PERSONAL FACTORS: Age, Behavior pattern, Education, Restaurant manager, fast food, Past/current experiences, Profession, Sex, Social background, Time since onset of injury/illness/exacerbation, and 3+ comorbidities: chickenpox COPD, CAD, HTN, GERD, OSA, type II DM, bilateral TKA  are also affecting patient's functional outcome.   REHAB POTENTIAL: Good  CLINICAL DECISION MAKING: Evolving/moderate complexity  EVALUATION COMPLEXITY: Moderate   GOALS: Goals reviewed with patient? Yes  SHORT TERM GOALS: Target date: 02/23/2023  Patient will be independent in home exercise program to improve strength/mobility for better functional independence with ADLs. Baseline: 2/12: given to patient Goal status: INITIAL  2.  .  Patient will report a worst pain of  5/10 on VAS in low back to improve tolerance with ADLs and reduced symptoms with activities. Baseline: 2/12: 10/10 Goal status: INITIAL    LONG TERM GOALS: Target date: 03/23/2023  Patient will increase FOTO score to equal  to or greater than 48%    to demonstrate statistically significant improvement in mobility and quality of life.  Baseline: 2/12: 36% Goal status: INITIAL  2.  Patient will report a worst pain of 3/10 on VAS in low back to improve tolerance with ADLs and reduced symptoms with activities.  Baseline: 2/12: 10/10 Goal status: INITIAL  3.  Patient will increase 10 meter walk test to >1.65ms as to improve gait speed for better community ambulation and to reduce fall risk Baseline: 2/12: unable to ambulate 10 meters Goal status: INITIAL  4.  Patient (> 613years old) will complete five times sit to stand test in < 15 seconds indicating an increased LE strength and improved balance. Baseline: 2/12: unable to perform due to severe  pain Goal status: INITIAL    PLAN:  PT FREQUENCY: 2x/week  PT DURATION: 8 weeks  PLANNED INTERVENTIONS: Therapeutic exercises, Therapeutic activity, Neuromuscular re-education, Balance training, Gait training, Patient/Family education, Self Care, Joint mobilization, Stair training, Vestibular training, Canalith repositioning, Orthotic/Fit training, DME instructions, Electrical stimulation, Spinal manipulation, Spinal mobilization, Cryotherapy, Moist heat, Splintting, Taping, Vasopneumatic device, Traction, Ultrasound, Manual therapy, and Re-evaluation.  PLAN FOR NEXT SESSION:   anterior tilting, reducing muscle tension, core and LE strengthening.    HRicard DillonPT, DPT   Physical Therapist - [facility] 02/12/23, 1:11 PM

## 2023-02-13 MED FILL — Dexamethasone Sodium Phosphate Inj 100 MG/10ML: INTRAMUSCULAR | Qty: 1 | Status: AC

## 2023-02-13 MED FILL — Fosaprepitant Dimeglumine For IV Infusion 150 MG (Base Eq): INTRAVENOUS | Qty: 5 | Status: AC

## 2023-02-13 NOTE — Progress Notes (Signed)
[facility] OFFICE PROGRESS NOTE  Doreene Nest, NP [address] [place] [address]  DIAGNOSIS:  Relapsed extensive stage (T4, N3, M1c)  small cell lung cancer diagnosed in April 2021 and presented with large right upper lobe/right hilar mass with ipsilateral and contralateral mediastinal and right supraclavicular lymphadenopathy in addition to multiple liver lesions. He has disease progression after the first line of chemotherapy in December 2021.     PRIOR THERAPY: 1) Palliative radiotherapy to the right upper lobe lung mass under the care of Dr. Mitzi Hansen. 2) Systemic chemotherapy with carboplatin for AUC of 5 on day 1, etoposide 100 mg/M2 on days 1, 2 and 3 in addition to Imfinzi 1500 mg IV every 3 weeks with chemotherapy then every 4 weeks for maintenance if the patient has no evidence for progression.  He will also receive Cosela 240 mg/m2 on the days of the chemotherapy.  Status post 9 cycles.  Starting from cycle #5 the patient will be on maintenance treatment with immunotherapy with Imfinzi 1500 mg IV every 4 weeks. Last dose of chemotherapy was given on November 13, 2020. This treatment was discontinued secondary to disease progression 3) Systemic chemotherapy with carboplatin for AUC of 5 on day 1, etoposide 100 mg/M2 on days 1, 2 and 3, Tecentriq 1200 mg IV every 3 weeks as well as Cosela 250 mg/M2 on the days of the chemotherapy every 3 weeks.  First dose December 18, 2020.  Status post 8 cycles. 4) Zepzelca 3.2 mgm2 IV every 3 weeks. Last dose on 01/23/22. Status post 12 cycles 5) Palliative systemic chemotherapy with irinotecan 65 mg/m2 on days 1 and 8 IV every 3 weeks.  Status post 3 cycles.  Last dose was given April 01, 2022 discontinued secondary to disease progression 6) SBRT to the progressive liver lesions under the care of Dr. Mitzi Hansen.  Last fraction January 27, 2023   CURRENT THERAPY: systemic chemotherapy with carboplatin for AUC of 5 on day 1, etoposide 100  Mg/M2 on days 1, 2 and 3 with Cosela before the chemotherapy.  First dose expected to start on 05/28/2022.  Status post 12 cycles.  Starting from cycle #5 his carboplatin will be reduced to AUC of 4 and 2 etoposide 80 Mg/M2.   INTERVAL HISTORY: Adam Wise 72 y.o. male returns to the clinic today for a follow-up visit accompanied by his wife.  The patient is feeling fairly well today without any concerning complaints except he is undergoing physical therapy for degenerative back changes.  The patient is currently undergoing chemotherapy.  The patient is tolerating treatment fairly well although he does often require blood transfusion support. He last received 1 unit of blood about 12 days ago.   He denies fevers, chills, night sweats, or unexplained weight loss.  Denies any cough or hemoptysis.  He reports baseline dyspnea on exertion, some of which he associates with his back pain. He denies any abnormal bleeding except he does bruise easier due to his eliquis. He had some mild blood streaked mucus from blowing his nose and from the CPAP. He sometimes bleeds if his scratches himself. Denies any nausea, vomiting, or diarrhea.  He denies constipation. Denies any headache or visual changes.  He recently had a restaging CT scan performed.  He is here today for evaluation and to review his scan results before starting cycle #13    MEDICAL HISTORY: Past Medical History:  Diagnosis Date   Chickenpox    Chronic knee pain  Chronic low back pain    COPD exacerbation (HCC) 10/11/2021   COPD with exacerbation (HCC) 10/12/2021   Coronary artery calcification seen on CAT scan 11/2021   Coronary CTA 11/27/2021: Moderate to large right pleural effusion and compressive atelectasis right lung base. => Coronary Calcium Score 657.  Diffuse RCA calcification.  Minimal mild disease in the LAD and diagonal branches. == Overall limited study.  Notable artifact.   Essential hypertension    GERD (gastroesophageal reflux  disease)    Hyperlipidemia    Malignant neoplasm of upper lobe of right lung (HCC) 04/04/2020   OSA (obstructive sleep apnea)    With nighttime oxygen supplementation   T4, M3, M1 C Metastatic Small Cell Lung Cancer 03/2020   large right upper lobe/right hilar mass with ipsilateral and contralateral mediastinal and right supraclavicular lymphadenopathy in addition to multiple liver lesios. He has disease progression after the first line of chemotherapy in December 2021.   Testosterone deficiency    Type 2 diabetes mellitus (HCC)     ALLERGIES:  is allergic to bupropion and hydrochlorothiazide.  MEDICATIONS:  Current Outpatient Medications  Medication Sig Dispense Refill   albuterol (VENTOLIN HFA) 108 (90 Base) MCG/ACT inhaler Inhale 2 puffs into the lungs every 6 (six) hours as needed for wheezing or shortness of breath. 18 g 0   amLODipine (NORVASC) 10 MG tablet TAKE 1 TABLET BY MOUTH EVERY DAY FOR BLOOD PRESSURE 90 tablet 1   B-D 3CC LUER-LOK SYR 22GX1" 22G X 1" 3 ML MISC USE AS INSTRUCTED FOR TESTOSTERONE INJECTION EVERY 2 WEEKS 10 each 2   bisoprolol (ZEBETA) 5 MG tablet TAKE 1 TABLET (5 MG TOTAL) BY MOUTH DAILY. 90 tablet 0   Black Pepper-Turmeric (TURMERIC COMPLEX/BLACK PEPPER PO) Take 1 tablet by mouth in the morning and at bedtime.     blood glucose meter kit and supplies KIT Dispense based on patient and insurance preference. Use up to four times daily as directed. 1 each 0   Budeson-Glycopyrrol-Formoterol (BREZTRI AEROSPHERE) 160-9-4.8 MCG/ACT AERO Inhale 2 puffs into the lungs in the morning and at bedtime. 10.7 g 3   chlorpheniramine-HYDROcodone (TUSSIONEX) 10-8 MG/5ML Take 5 mLs by mouth every 12 (twelve) hours as needed for cough. 473 mL 0   Coenzyme Q10 (COQ10) 200 MG CAPS Take 200 mg by mouth at bedtime.     ELIQUIS 5 MG TABS tablet TAKE 1 TABLET BY MOUTH TWICE A DAY 180 tablet 2   esomeprazole (NEXIUM) 20 MG capsule Take 20 mg by mouth in the morning.     gabapentin  (NEURONTIN) 300 MG capsule TAKE 1 CAPSULE (300 MG TOTAL) BY MOUTH 2 (TWO) TIMES DAILY. FOR BACK PAIN. 180 capsule 2   glipiZIDE (GLUCOTROL) 10 MG tablet TAKE 1 TABLET (10 MG TOTAL) BY MOUTH 2 (TWO) TIMES DAILY BEFORE A MEAL. FOR DIABETES. 180 tablet 1   glucose blood (ONETOUCH ULTRA) test strip USE UP TO 4 TIMES DAILY AS DIRECTED 400 strip 5   Insulin Glargine (BASAGLAR KWIKPEN) 100 UNIT/ML Inject 24 Units into the skin at bedtime. For diabetes. 30 mL 1   Insulin Pen Needle (BD PEN NEEDLE NANO U/F) 32G X 4 MM MISC Use with insulin as prescribed Dx Code: E11.9 200 each 3   ipratropium-albuterol (DUONEB) 0.5-2.5 (3) MG/3ML SOLN Take 3 mLs by nebulization every 4 (four) hours while awake for 3 days, THEN 3 mLs every 4 (four) hours as needed (shortness of breath or wheezing). 360 mL 0   KRILL OIL PO Take  1 capsule by mouth daily.     Lancets MISC USE UP TO 3 TIMES DAILY AS DIRECTED 100 each 2   losartan (COZAAR) 50 MG tablet TAKE 1 TABLET (50 MG TOTAL) BY MOUTH DAILY. FOR BLOOD PRESSURE. 90 tablet 2   Melatonin 10 MG CAPS Take 10 mg by mouth at bedtime.     metFORMIN (GLUCOPHAGE) 1000 MG tablet TAKE 1 TABLET (1,000 MG TOTAL) BY MOUTH 2 (TWO) TIMES DAILY WITH A MEAL. FOR DIABETES 180 tablet 1   Multiple Vitamin (MULTI-VITAMINS) TABS Take 1 tablet by mouth in the morning.     oxymetazoline (AFRIN) 0.05 % nasal spray Place 1 spray into both nostrils daily as needed for congestion.     pioglitazone (ACTOS) 45 MG tablet TAKE 1 TABLET (45 MG TOTAL) BY MOUTH DAILY. FOR DIABETES. 90 tablet 1   pravastatin (PRAVACHOL) 40 MG tablet TAKE 1 TABLET BY MOUTH EVERY DAY IN THE EVENING FOR CHOLESTEROL 90 tablet 2   testosterone cypionate (DEPOTESTOSTERONE CYPIONATE) 200 MG/ML injection Inject 200 mg into the muscle every 14 (fourteen) days.     traMADol (ULTRAM) 50 MG tablet Take 1 tablet (50 mg total) by mouth at bedtime as needed for severe pain. 30 tablet 0   No current facility-administered medications for this  visit.    SURGICAL HISTORY:  Past Surgical History:  Procedure Laterality Date   CHEST TUBE INSERTION Right 01/20/2022   Procedure: CHEST TUBE INSERTION;  Surgeon: Lorin Glass, MD;  Location: [facility] ENDOSCOPY;  Service: Pulmonary;  Laterality: Right;  w/ Talc Pleurodesis, planned admit for Obs afterwards   CHEST TUBE INSERTION Right 04/28/2022   Procedure: INSERTION PLEURAL DRAINAGE CATHETER - Pigtail tail, drainage;  Surgeon: Lorin Glass, MD;  Location: [facility] ENDOSCOPY;  Service: Pulmonary;  Laterality: Right;  talc pleurodesis   CHEST TUBE INSERTION Right 09/01/2022   Procedure: INSERTION PLEURAL DRAINAGE CATHETER;  Surgeon: Lupita Leash, MD;  Location: [facility] ENDOSCOPY;  Service: Cardiopulmonary;  Laterality: Right;  pigtail catheter placement with talc pleurodesis   COLONOSCOPY WITH PROPOFOL N/A 12/17/2018   Procedure: COLONOSCOPY WITH PROPOFOL;  Surgeon: Pasty Spillers, MD;  Location: [facility] ENDOSCOPY;  Service: Gastroenterology;  Laterality: N/A;   IR IMAGING GUIDED PORT INSERTION  04/17/2020   IR THORACENTESIS ASP PLEURAL SPACE W/IMG GUIDE  11/29/2021   IR THORACENTESIS ASP PLEURAL SPACE W/IMG GUIDE  01/03/2022   JOINT REPLACEMENT Bilateral    REPLACEMENT TOTAL KNEE BILATERAL  2015   TALC PLEURODESIS  09/01/2022   Procedure: Lurlean Nanny;  Surgeon: Lupita Leash, MD;  Location: [facility] ENDOSCOPY;  Service: Cardiopulmonary;;   TONSILLECTOMY  1960   TRANSTHORACIC ECHOCARDIOGRAM  05/2020   a) 05/2020: EF 55 to 60%.  No R WMA.  Mild LVH.  Indeterminate LVEDP.  Unable to assess RVP.  Normal aortic and mitral valves.  Mildly elevated RAP.; b) 09/2021: EF 50-55%. No RWMA. Mild LVH. ~ LVEDP. Mild LA Dilation. NORMAL RV/RAP.  Normal MV/AoV.    REVIEW OF SYSTEMS:   Review of Systems  Constitutional: Negative for appetite change, chills, fatigue, fever and unexpected weight change.  HENT:   Negative for mouth sores, nosebleeds, sore throat and trouble swallowing.   Eyes: Negative for  eye problems and icterus.  Respiratory: Positive for stable dyspnea on exertion.  Negative for cough, hemoptysis, and wheezing.   Cardiovascular: Negative for chest pain and leg swelling.  Gastrointestinal: Negative for abdominal pain, constipation, diarrhea, nausea and vomiting.  Genitourinary: Negative for bladder incontinence, difficulty urinating, dysuria,  frequency and hematuria.   Musculoskeletal: Positive for chronic back pain and right hip pain.  Negative for gait problem, neck pain and neck stiffness.  Skin: Negative for itching and rash.  Neurological: Negative for dizziness, extremity weakness, gait problem, headaches, light-headedness and seizures.  Hematological: Negative for adenopathy. Does not bruise/bleed easily.  Psychiatric/Behavioral: Negative for confusion, depression and sleep disturbance. The patient is not nervous/anxious.    PHYSICAL EXAMINATION:  Blood pressure 130/75, pulse 85, temperature 98.2 F (36.8 C), temperature source Oral, resp. rate 16, weight 296 lb 14.4 oz (134.7 kg), SpO2 96 %.  ECOG PERFORMANCE STATUS: 1-2  Physical Exam  Constitutional: Oriented to person, place, and time and well-developed, well-nourished, and in no distress.  HENT:  Head: Normocephalic and atraumatic.  Mouth/Throat: Oropharynx is clear and moist. No oropharyngeal exudate.  Eyes: Conjunctivae are normal. Right eye exhibits no discharge. Left eye exhibits no discharge. No scleral icterus.  Neck: Normal range of motion. Neck supple.  Cardiovascular: Normal rate, regular rhythm, normal heart sounds and intact distal pulses.   Pulmonary/Chest: Effort normal and breath sounds normal. No respiratory distress. No wheezes. No rales.  Abdominal: Soft. Bowel sounds are normal. Exhibits no distension and no mass. There is no tenderness.  Musculoskeletal: Normal range of motion. Exhibits no edema.  Lymphadenopathy:    No cervical adenopathy.  Neurological: Alert and oriented to person,  place, and time. Exhibits normal muscle tone. Examined in the wheelchair.  Skin: Skin is warm and dry. No rash noted. Not diaphoretic. No erythema. No pallor.  Psychiatric: Mood, memory and judgment normal.  Vitals reviewed. LABORATORY DATA: Lab Results  Component Value Date   WBC 3.0 (L) 02/16/2023   HGB 8.0 (L) 02/16/2023   HCT 25.0 (L) 02/16/2023   MCV 100.8 (H) 02/16/2023   PLT 102 (L) 02/16/2023      Chemistry      Component Value Date/Time   NA 140 02/16/2023 0818   K 4.0 02/16/2023 0818   CL 104 02/16/2023 0818   CO2 28 02/16/2023 0818   BUN 15 02/16/2023 0818   CREATININE 1.06 02/16/2023 0818   CREATININE 1.45 (H) 10/18/2021 1533      Component Value Date/Time   CALCIUM 7.8 (L) 02/16/2023 0818   ALKPHOS 55 02/16/2023 0818   AST 20 02/16/2023 0818   ALT 16 02/16/2023 0818   BILITOT 0.4 02/16/2023 0818       RADIOGRAPHIC STUDIES:  CT Chest W Contrast  Result Date: 02/13/2023 CLINICAL DATA:  Staging small-cell lung cancer. Known liver lesions. EXAM: CT CHEST, ABDOMEN, AND PELVIS WITH CONTRAST TECHNIQUE: Multidetector CT imaging of the chest, abdomen and pelvis was performed following the standard protocol during bolus administration of intravenous contrast. RADIATION DOSE REDUCTION: This exam was performed according to the departmental dose-optimization program which includes automated exposure control, adjustment of the mA and/or kV according to patient size and/or use of iterative reconstruction technique. CONTRAST:  OMNIPAQUE IOHEXOL 350 MG/ML SOLN COMPARISON:  CT 11/14/2022 and older.  MRI 12/02/2022 FINDINGS: CT CHEST FINDINGS Cardiovascular: Right upper chest port. Heart is nonenlarged. Coronary artery calcifications are seen. Trace pericardial fluid. The thoracic aorta has some mild atherosclerotic plaque. Slightly tortuous course to the descending thoracic aorta. Mediastinum/Nodes: Normal caliber thoracic esophagus. No specific abnormal lymph node enlargement  seen in the axillary region or left hilum. Minimal soft tissue thickening in the right hilum without true abnormal node. Tissue seen anterior to the right bronchus on series 2, image 21 measures 2.4 by 1.5  cm today and previously 2.6 x 1.7 cm. This has demonstrated long-term stability going back to multiple scans. No mediastinal nodal enlargement Lungs/Pleura: Left lung is grossly clear. No consolidation, pneumothorax or effusion. No dominant nodule. The right lung has volume loss with central bronchiectasis and perihilar, paramediastinal thickening. Please correlate with prior radiation. Small right pleural effusion with some loculated components. There also pleural thickening with pleural calcifications which is stable. No new right-sided lung nodule. Musculoskeletal: Curvature of the spine with some degenerative changes. Stable sclerosis of the head of the left clavicle. CT ABDOMEN PELVIS FINDINGS Hepatobiliary: Once again there are liver lesions identified. The lesion seen inferiorly in segment 4 now has a adjacent fiduciary marker. Previously lesion measured 5.1 x 3.9 cm and today on series 2, image 56 3.8 by 2.8 cm. The smaller dome lesion which previously 17 x 14 mm, today on series 2, image 45 measures 16 by 12 mm. There is also a lesion along the caudate on series 2, image 54. This appears similar. Small punctate calcification in segment 6 of the liver on image 60. Patent portal vein. Contracted gallbladder. Pancreas: Global pancreatic atrophy without obvious mass or ductal dilatation. Spleen: Normal in size without focal abnormality. Adrenals/Urinary Tract: Left adrenal gland is preserved. There is a right adrenal nodule once again identified which previously measured 15 x 11 mm. Today 15 x 11 mm on image 56 of series 2. Bosniak 1 right-sided renal cysts are again identified. No specific imaging follow-up. Bilateral mild renal atrophy. No collecting system dilatation. Punctate nonobstructing left-sided  renal stone. The ureters have normal course and caliber down to the bladder. Bladder has a preserved contour. Stomach/Bowel: Large bowel has moderate colonic stool. No obstruction or dilatation. Normal appendix. The stomach is underdistended. Prominent second portion duodenal diverticulum. Small bowel is nondilated. Vascular/Lymphatic: Normal caliber aorta and IVC with atherosclerotic changes. No specific abnormal lymph node enlargement identified in the abdomen and pelvis. Reproductive: Prostate is unremarkable. Other: Mild anasarca.  Small fat containing umbilical hernia. Musculoskeletal: Mild degenerative changes along the spine. Degenerative changes of the pelvis particularly the right hip. IMPRESSION: Liver lesions are again identified appears similar except for decrease in size of the lesion in segment 4B. Stable right adrenal nodule. Unchanged appearance of the presumed posttreatment/radiation changes right perihilar with volume loss and distortion. Stable small complex right pleural effusion with some pleural thickening and calcification. No new developing mass lesion or lymph node enlargement at this time. Electronically Signed   By: Karen Kays M.D.   On: 02/13/2023 14:36   CT Abdomen Pelvis W Contrast  Result Date: 02/13/2023 CLINICAL DATA:  Staging small-cell lung cancer. Known liver lesions. EXAM: CT CHEST, ABDOMEN, AND PELVIS WITH CONTRAST TECHNIQUE: Multidetector CT imaging of the chest, abdomen and pelvis was performed following the standard protocol during bolus administration of intravenous contrast. RADIATION DOSE REDUCTION: This exam was performed according to the departmental dose-optimization program which includes automated exposure control, adjustment of the mA and/or kV according to patient size and/or use of iterative reconstruction technique. CONTRAST:  OMNIPAQUE IOHEXOL 350 MG/ML SOLN COMPARISON:  CT 11/14/2022 and older.  MRI 12/02/2022 FINDINGS: CT CHEST FINDINGS  Cardiovascular: Right upper chest port. Heart is nonenlarged. Coronary artery calcifications are seen. Trace pericardial fluid. The thoracic aorta has some mild atherosclerotic plaque. Slightly tortuous course to the descending thoracic aorta. Mediastinum/Nodes: Normal caliber thoracic esophagus. No specific abnormal lymph node enlargement seen in the axillary region or left hilum. Minimal soft tissue thickening in the  right hilum without true abnormal node. Tissue seen anterior to the right bronchus on series 2, image 21 measures 2.4 by 1.5 cm today and previously 2.6 x 1.7 cm. This has demonstrated long-term stability going back to multiple scans. No mediastinal nodal enlargement Lungs/Pleura: Left lung is grossly clear. No consolidation, pneumothorax or effusion. No dominant nodule. The right lung has volume loss with central bronchiectasis and perihilar, paramediastinal thickening. Please correlate with prior radiation. Small right pleural effusion with some loculated components. There also pleural thickening with pleural calcifications which is stable. No new right-sided lung nodule. Musculoskeletal: Curvature of the spine with some degenerative changes. Stable sclerosis of the head of the left clavicle. CT ABDOMEN PELVIS FINDINGS Hepatobiliary: Once again there are liver lesions identified. The lesion seen inferiorly in segment 4 now has a adjacent fiduciary marker. Previously lesion measured 5.1 x 3.9 cm and today on series 2, image 56 3.8 by 2.8 cm. The smaller dome lesion which previously 17 x 14 mm, today on series 2, image 45 measures 16 by 12 mm. There is also a lesion along the caudate on series 2, image 54. This appears similar. Small punctate calcification in segment 6 of the liver on image 60. Patent portal vein. Contracted gallbladder. Pancreas: Global pancreatic atrophy without obvious mass or ductal dilatation. Spleen: Normal in size without focal abnormality. Adrenals/Urinary Tract: Left adrenal  gland is preserved. There is a right adrenal nodule once again identified which previously measured 15 x 11 mm. Today 15 x 11 mm on image 56 of series 2. Bosniak 1 right-sided renal cysts are again identified. No specific imaging follow-up. Bilateral mild renal atrophy. No collecting system dilatation. Punctate nonobstructing left-sided renal stone. The ureters have normal course and caliber down to the bladder. Bladder has a preserved contour. Stomach/Bowel: Large bowel has moderate colonic stool. No obstruction or dilatation. Normal appendix. The stomach is underdistended. Prominent second portion duodenal diverticulum. Small bowel is nondilated. Vascular/Lymphatic: Normal caliber aorta and IVC with atherosclerotic changes. No specific abnormal lymph node enlargement identified in the abdomen and pelvis. Reproductive: Prostate is unremarkable. Other: Mild anasarca.  Small fat containing umbilical hernia. Musculoskeletal: Mild degenerative changes along the spine. Degenerative changes of the pelvis particularly the right hip. IMPRESSION: Liver lesions are again identified appears similar except for decrease in size of the lesion in segment 4B. Stable right adrenal nodule. Unchanged appearance of the presumed posttreatment/radiation changes right perihilar with volume loss and distortion. Stable small complex right pleural effusion with some pleural thickening and calcification. No new developing mass lesion or lymph node enlargement at this time. Electronically Signed   By: Karen Kays M.D.   On: 02/13/2023 14:36   MR Brain W Wo Contrast  Result Date: 02/08/2023 CLINICAL DATA:  Small-cell lung cancer, surveillance. EXAM: MRI HEAD WITHOUT AND WITH CONTRAST TECHNIQUE: Multiplanar, multiecho pulse sequences of the brain and surrounding structures were obtained without and with intravenous contrast. CONTRAST:  10mL GADAVIST GADOBUTROL 1 MMOL/ML IV SOLN COMPARISON:  Brain MRI 09/29/2022 FINDINGS: Brain: There is no  acute intracranial hemorrhage, extra-axial fluid collection, or acute infarct. Parenchymal volume is stable. The ventricles are stable in size. Scattered small foci of FLAIR signal abnormality in the supratentorial white matter likely reflecting sequela of chronic small-vessel ischemic change are stable. A single punctate chronic microhemorrhage in the left cerebellar hemisphere is unchanged. 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 A tiny nonenhancing lesion in the left aspect of the sella is unchanged (10-19). Small enhancing  lesions at the level of the foramen magnum bilaterally are unchanged (1200-85, 92). There is no suspicious mass lesion or enhancement. There is no mass effect or midline shift. Vascular: Normal flow voids. Skull and upper cervical spine: Normal marrow signal. Sinuses/Orbits: The paranasal sinuses are clear. The globes and orbits are unremarkable. Other: None. IMPRESSION: No evidence of intracranial metastatic disease. Electronically Signed   By: Lesia Hausen M.D.   On: 02/08/2023 10:37     ASSESSMENT/PLAN:  This is a very pleasant 72 year old Caucasian male diagnosed with extensive stage (T4, N3, M1C) small cell lung cancer presented with large right upper lobe lung mass in addition to mediastinal and right supraclavicular lymphadenopathy and multiple metastatic liver lesions diagnosed in April 2021.   The patient initially underwent systemic chemotherapy with carboplatin for an AUC of 5 on day 1, etoposide 100 mg per metered squared on days 1, 2, and 3 in addition to [facility] for myeloprotection.  He also received immunotherapy with Imfinzi on day 1 of every chemotherapy cycle.  He is status post 8 cycles.  Starting from cycle #5 he was on single agent immunotherapy with Imfinzi IV every 4 weeks   The patient had evidence of disease progression on his scan from December 2021.   He was then started on systemic chemotherapy with carboplatin for an AUC of 5 on day  1, etoposide 100 mg per metered squared on days 1, 2, and 3 in addition to Tecentriq 1200 mg on day 1, the patient is status post 8 cycles.  Starting from cycle #5, the patient started maintenance immunotherapy with Tecentriq.  This was discontinued due to evidence of disease progression.   The patient then underwent Zepzelca IV every 3 weeks.  He is status post his 12 cycles and he tolerated it well except for fatigue a few days after treatment.  This was discontinued in February 2023 due to evidence of disease progression.    The patient then underwent treatment with irinotecan 65 mg per metered squared on days 1 and 8 IV every 3 weeks.  The patient is status post 3 cycles and tolerated it fairly well without any concerning adverse side effects.  This was discontinued secondary to disease progression.   Dr. Arbutus Ped saw the patient after this to discuss the options including palliative care and hospice versus single agent Gemzar, or Taxol.  He also discussed repeating his initial treatment with carboplatin and etoposide since he had a good response to treatment in the past.   He was referred to Dr. Janann August at [facility]  for consideration of enrollment in the clinical trial with tarlatamab.    He also saw Dr. Loney Hering at [facility] for consideration of enrollment in the [place] [id number] trial.  The patient was not interested due to the travel burden.   The patient was interested in restarting systemic chemotherapy with carboplatin for an AUC of 5 on day 1 and etoposide 100 mg per metered squared on days 1, 2, and 3 with Cosela. He is status post 12 cycles and tolerated it well. His dose of carboplatin was reduced to an AUC of 4 and etoposide 80 mg per metered squared.     At the patient's last appointment, his restaging CT scan from December showed enlarging liver lesions. He underwent SBRT to this which was completed on 01/27/23.   Due to the lack of any good options, Dr. Arbutus Ped recommended he continue on  his systemic therapy for now.  Patient recently had a restaging CT scan performed.  Dr. Arbutus Ped personally and independently reviewed the scan and discussed the results with the patient today.  The scan showed no evidence of disease progression.  Dr. Arbutus Ped recommends he continue on the same treatment.   He will proceed with cycle #13 today as scheduled.   We will administer 1 unit of blood this week due to his Hbg of 8.0 and likely this number will worsen since he will be undergoing chemotherapy this week. We will monitor this closely. I have added sample to blood bank holds on his weekly labs  We will see him back for a follow up in 3 weeks before starting the next cycle of treatment.   We will still need to monitor his labs closely on a weekly basis and arrange supportive care if needed.   The patient was advised to call immediately if he has any concerning symptoms in the interval. The patient voices understanding of current disease status and treatment options and is in agreement with the current care plan. All questions were answered. The patient knows to call the clinic with any problems, questions or concerns. We can certainly see the patient much sooner if necessary   No orders of the defined types were placed in this encounter.    Sora Olivo L Halcyon Heck, PA-C 02/16/23  ADDENDUM: Hematology/Oncology Attending: I had a face-to-face encounter with the patient today.  I reviewed his record, lab, scan and recommended his care plan.  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  The patient is status post several treatment options including palliative radiotherapy to the right upper lobe lung mass in addition to systemic chemotherapy initially with carboplatin, etoposide, Imfinzi and Cosela for 4 cycles followed by maintenance treatment with Imfinzi for 5 more cycles before it was  discontinued secondary to disease progression. He resumed treatment again with carboplatin, etoposide and atezolizumab with Cosela for induction of 4 cycles followed by 4 cycles of maintenance atezolizumab discontinued secondary to disease progression. The patient was then treated with Zepzelca (lurbinectedin) for 12 cycles before it was discontinued secondary to disease progression. He then underwent palliative systemic chemotherapy with irinotecan for 3 cycles discontinued secondary to disease progression.  He underwent SBRT to progressive liver lesion under the care of Dr. Mitzi Hansen completed in February 2024. The patient is currently undergoing systemic chemotherapy again with reduced dose carboplatin for AUC of 4 and oh etoposide 80 Mg/M2 status post 12 cycles.  He has been tolerating this treatment well with no concerning findings for disease progression. He had repeat CT scan of the chest, abdomen and pelvis performed recently.  I personally and independently reviewed the scan and discussed the results with the patient and his wife today. His scan showed some mild improvement of his disease but in general stable disease. I recommended for the patient to continue his current treatment with the same regimen for now until the availability of clinical trial at [facility] for his condition. The patient and his wife are in agreement with the current plan. He understands the risk of the hypersensitivity reaction from the prolonged treatment with carboplatin and also of the potential development of MDS or acute leukemia from the treatment with etoposide. The patient will proceed with cycle #13 today. He will come back for follow-up visit in 3 weeks for evaluation before starting cycle #14. For the chemotherapy-induced anemia,  will arrange for the patient to receive 1 unit of PRBCs transfusion this week. The patient was advised to call immediately if he has any concerning symptoms in the interval. The  total time spent in the appointment was 30 minutes. Disclaimer: This note was dictated with voice recognition software. Similar sounding words can inadvertently be transcribed and may be missed upon review. Lajuana Matte, MD 02/16/23

## 2023-02-16 ENCOUNTER — Inpatient Hospital Stay (HOSPITAL_BASED_OUTPATIENT_CLINIC_OR_DEPARTMENT_OTHER): Payer: Medicare Other | Admitting: Physician Assistant

## 2023-02-16 ENCOUNTER — Inpatient Hospital Stay: Payer: Medicare Other

## 2023-02-16 ENCOUNTER — Inpatient Hospital Stay: Payer: Medicare Other | Attending: Internal Medicine

## 2023-02-16 VITALS — BP 120/71 | HR 77 | Temp 98.1°F | Resp 16

## 2023-02-16 DIAGNOSIS — D61810 Antineoplastic chemotherapy induced pancytopenia: Secondary | ICD-10-CM | POA: Insufficient documentation

## 2023-02-16 DIAGNOSIS — C3411 Malignant neoplasm of upper lobe, right bronchus or lung: Secondary | ICD-10-CM

## 2023-02-16 DIAGNOSIS — Z5111 Encounter for antineoplastic chemotherapy: Secondary | ICD-10-CM | POA: Diagnosis not present

## 2023-02-16 DIAGNOSIS — Z95828 Presence of other vascular implants and grafts: Secondary | ICD-10-CM

## 2023-02-16 DIAGNOSIS — Z452 Encounter for adjustment and management of vascular access device: Secondary | ICD-10-CM | POA: Diagnosis not present

## 2023-02-16 DIAGNOSIS — D6481 Anemia due to antineoplastic chemotherapy: Secondary | ICD-10-CM

## 2023-02-16 DIAGNOSIS — Z5189 Encounter for other specified aftercare: Secondary | ICD-10-CM | POA: Insufficient documentation

## 2023-02-16 DIAGNOSIS — C787 Secondary malignant neoplasm of liver and intrahepatic bile duct: Secondary | ICD-10-CM | POA: Insufficient documentation

## 2023-02-16 LAB — CMP (CANCER CENTER ONLY)
ALT: 16 U/L (ref 0–44)
AST: 20 U/L (ref 15–41)
Albumin: 3.7 g/dL (ref 3.5–5.0)
Alkaline Phosphatase: 55 U/L (ref 38–126)
Anion gap: 8 (ref 5–15)
BUN: 15 mg/dL (ref 8–23)
CO2: 28 mmol/L (ref 22–32)
Calcium: 7.8 mg/dL — ABNORMAL LOW (ref 8.9–10.3)
Chloride: 104 mmol/L (ref 98–111)
Creatinine: 1.06 mg/dL (ref 0.61–1.24)
GFR, Estimated: >60 mL/min (ref >60)
Glucose, Bld: 158 mg/dL — ABNORMAL HIGH (ref 70–99)
Potassium: 4 mmol/L (ref 3.5–5.1)
Sodium: 140 mmol/L (ref 135–145)
Total Bilirubin: 0.4 mg/dL (ref 0.3–1.2)
Total Protein: 6.1 g/dL — ABNORMAL LOW (ref 6.5–8.1)

## 2023-02-16 LAB — CBC WITH DIFFERENTIAL (CANCER CENTER ONLY)
Abs Immature Granulocytes: 0.05 10*3/uL (ref 0.00–0.07)
Basophils Absolute: 0 10*3/uL (ref 0.0–0.1)
Basophils Relative: 1 %
Eosinophils Absolute: 0.1 10*3/uL (ref 0.0–0.5)
Eosinophils Relative: 2 %
HCT: 25 % — ABNORMAL LOW (ref 39.0–52.0)
Hemoglobin: 8 g/dL — ABNORMAL LOW (ref 13.0–17.0)
Immature Granulocytes: 2 %
Lymphocytes Relative: 15 %
Lymphs Abs: 0.5 10*3/uL — ABNORMAL LOW (ref 0.7–4.0)
MCH: 32.3 pg (ref 26.0–34.0)
MCHC: 32 g/dL (ref 30.0–36.0)
MCV: 100.8 fL — ABNORMAL HIGH (ref 80.0–100.0)
Monocytes Absolute: 0.6 10*3/uL (ref 0.1–1.0)
Monocytes Relative: 19 %
Neutro Abs: 1.8 10*3/uL (ref 1.7–7.7)
Neutrophils Relative %: 61 %
Platelet Count: 102 10*3/uL — ABNORMAL LOW (ref 150–400)
RBC: 2.48 MIL/uL — ABNORMAL LOW (ref 4.22–5.81)
RDW: 17.9 % — ABNORMAL HIGH (ref 11.5–15.5)
WBC Count: 3 10*3/uL — ABNORMAL LOW (ref 4.0–10.5)
nRBC: 1.3 % — ABNORMAL HIGH (ref 0.0–0.2)

## 2023-02-16 LAB — SAMPLE TO BLOOD BANK

## 2023-02-16 LAB — PREPARE RBC (CROSSMATCH)

## 2023-02-16 MED ORDER — SODIUM CHLORIDE 0.9 % IV SOLN
150.0000 mg | Freq: Once | INTRAVENOUS | Status: AC
  Administered 2023-02-16: 150 mg via INTRAVENOUS
  Filled 2023-02-16: qty 150

## 2023-02-16 MED ORDER — SODIUM CHLORIDE 0.9 % IV SOLN
600.0000 mg | Freq: Once | INTRAVENOUS | Status: AC
  Administered 2023-02-16: 600 mg via INTRAVENOUS
  Filled 2023-02-16: qty 60

## 2023-02-16 MED ORDER — ACETAMINOPHEN 325 MG PO TABS
650.0000 mg | ORAL_TABLET | Freq: Once | ORAL | Status: AC
  Administered 2023-02-16: 650 mg via ORAL
  Filled 2023-02-16: qty 2

## 2023-02-16 MED ORDER — SODIUM CHLORIDE 0.9 % IV SOLN
10.0000 mg | Freq: Once | INTRAVENOUS | Status: AC
  Administered 2023-02-16: 10 mg via INTRAVENOUS
  Filled 2023-02-16: qty 10

## 2023-02-16 MED ORDER — HEPARIN SOD (PORK) LOCK FLUSH 100 UNIT/ML IV SOLN
500.0000 [IU] | Freq: Once | INTRAVENOUS | Status: AC | PRN
  Administered 2023-02-16: 500 [IU]

## 2023-02-16 MED ORDER — DIPHENHYDRAMINE HCL 25 MG PO CAPS: 25.0000 mg | ORAL_CAPSULE | Freq: Once | ORAL | Status: DC

## 2023-02-16 MED ORDER — FAMOTIDINE IN NACL 20-0.9 MG/50ML-% IV SOLN
20.0000 mg | Freq: Once | INTRAVENOUS | Status: AC
  Administered 2023-02-16: 20 mg via INTRAVENOUS
  Filled 2023-02-16: qty 50

## 2023-02-16 MED ORDER — PALONOSETRON HCL INJECTION 0.25 MG/5ML
0.2500 mg | Freq: Once | INTRAVENOUS | Status: AC
  Administered 2023-02-16: 0.25 mg via INTRAVENOUS
  Filled 2023-02-16: qty 5

## 2023-02-16 MED ORDER — SODIUM CHLORIDE 0.9% FLUSH
10.0000 mL | INTRAVENOUS | Status: DC | PRN
  Administered 2023-02-16: 10 mL

## 2023-02-16 MED ORDER — SODIUM CHLORIDE 0.9 % IV SOLN: Freq: Once | INTRAVENOUS | Status: AC

## 2023-02-16 MED ORDER — SODIUM CHLORIDE 0.9% IV SOLUTION
250.0000 mL | Freq: Once | INTRAVENOUS | Status: AC
  Administered 2023-02-16: 250 mL via INTRAVENOUS

## 2023-02-16 MED ORDER — SODIUM CHLORIDE 0.9 % IV SOLN
80.0000 mg/m2 | Freq: Once | INTRAVENOUS | Status: AC
  Administered 2023-02-16: 200 mg via INTRAVENOUS
  Filled 2023-02-16: qty 10

## 2023-02-16 MED ORDER — TRILACICLIB DIHYDROCHLORIDE INJECTION 300 MG
240.0000 mg/m2 | Freq: Once | INTRAVENOUS | Status: AC
  Administered 2023-02-16: 600 mg via INTRAVENOUS
  Filled 2023-02-16: qty 40

## 2023-02-16 MED ORDER — SODIUM CHLORIDE 0.9% FLUSH
10.0000 mL | Freq: Once | INTRAVENOUS | Status: AC
  Administered 2023-02-16: 10 mL

## 2023-02-16 MED FILL — Dexamethasone Sodium Phosphate Inj 100 MG/10ML: INTRAMUSCULAR | Qty: 1 | Status: AC

## 2023-02-16 NOTE — Progress Notes (Signed)
Patient seen by PA today  Vitals are within treatment parameters.  Labs reviewed: and are within treatment parameters.  Per physician team, patient is ready for treatment and there are NO modifications to the treatment plan.

## 2023-02-16 NOTE — Progress Notes (Signed)
Pt refused to take benadryl as a premedication for blood transfusion. Pt stated "I do not like how it makes me feel". Dr. Julien Nordmann made aware.

## 2023-02-16 NOTE — Patient Instructions (Addendum)
[facility]  Discharge Instructions: Thank you for choosing [facility] to provide your oncology and hematology care.   If you have a lab appointment with the [facility], please go directly to the [facility] and check in at the registration area.   Wear comfortable clothing and clothing appropriate for easy access to any Portacath or PICC line.   We strive to give you quality time with your provider. You may need to reschedule your appointment if you arrive late (15 or more minutes).  Arriving late affects you and other patients whose appointments are after yours.  Also, if you miss three or more appointments without notifying the office, you may be dismissed from the clinic at the provider's discretion.      For prescription refill requests, have your pharmacy contact our office and allow 72 hours for refills to be completed.    Today you received the following chemotherapy and/or immunotherapy agents: Cosela/Carboplatin/Etoposide     To help prevent nausea and vomiting after your treatment, we encourage you to take your nausea medication as directed.  BELOW ARE SYMPTOMS THAT SHOULD BE REPORTED IMMEDIATELY: *FEVER GREATER THAN 100.4 F (38 C) OR HIGHER *CHILLS OR SWEATING *NAUSEA AND VOMITING THAT IS NOT CONTROLLED WITH YOUR NAUSEA MEDICATION *UNUSUAL SHORTNESS OF BREATH *UNUSUAL BRUISING OR BLEEDING *URINARY PROBLEMS (pain or burning when urinating, or frequent urination) *BOWEL PROBLEMS (unusual diarrhea, constipation, pain near the anus) TENDERNESS IN MOUTH AND THROAT WITH OR WITHOUT PRESENCE OF ULCERS (sore throat, sores in mouth, or a toothache) UNUSUAL RASH, SWELLING OR PAIN  UNUSUAL VAGINAL DISCHARGE OR ITCHING   Items with * indicate a potential emergency and should be followed up as soon as possible or go to the Emergency Department if any problems should occur.  Please show the CHEMOTHERAPY ALERT CARD or IMMUNOTHERAPY  ALERT CARD at check-in to the Emergency Department and triage nurse.  Should you have questions after your visit or need to cancel or reschedule your appointment, please contact [facility]  Dept: [phone]  and follow the prompts.  Office hours are 8:00 a.m. to 4:30 p.m. Monday - Friday. Please note that voicemails left after 4:00 p.m. may not be returned until the following business day.  We are closed weekends and major holidays. You have access to a nurse at all times for urgent questions. Please call the main number to the clinic Dept: [phone] and follow the prompts.   For any non-urgent questions, you may also contact your provider using MyChart. We now offer e-Visits for anyone 78 and older to request care online for non-urgent symptoms. For details visit [email or web address].   Also download the MyChart app! Go to the app store, search "MyChart", open the app, select [facility], and log in with your MyChart username and password.  Blood Transfusion, Adult, Care After After a blood transfusion, it is common to have: Bruising and soreness at the IV site. A headache. Follow these instructions at home: Your doctor may give you more instructions. If you have problems, contact your doctor. Insertion site care     Follow instructions from your doctor about how to take care of your insertion site. This is where an IV tube was put into your vein. Make sure you: Wash your hands with soap and water for at least 20 seconds before and after you change your bandage. If you cannot use soap and water, use hand sanitizer. Change  your bandage as told by your doctor. Check your insertion site every day for signs of infection. Check for: Redness, swelling, or pain. Bleeding from the site. Warmth. Pus or a bad smell. General instructions Take over-the-counter and prescription medicines only as told by your doctor. Rest as told by your doctor. Go back  to your normal activities as told by your doctor. Keep all follow-up visits. You may need to have tests at certain times to check your blood. Contact a doctor if: You have itching or red, swollen areas of skin (hives). You have a fever or chills. You have pain in the head, back, or chest. You feel worried or nervous (anxious). You feel weak after doing your normal activities. You have any of these problems at the insertion site: Redness, swelling, warmth, or pain. Bleeding that does not stop with pressure. Pus or a bad smell. If you received your blood transfusion in an outpatient setting, you will be told whom to contact to report any reactions. Get help right away if: You have signs of a serious reaction. This may be coming from an allergy or the body's defense system (immune system). Signs include: Trouble breathing or shortness of breath. Swelling of the face or feeling warm (flushed). A widespread rash. Dark pee (urine) or blood in the pee. Fast heartbeat. These symptoms may be an emergency. Get help right away. Call 911. Do not wait to see if the symptoms will go away. Do not drive yourself to the hospital. Summary Bruising and soreness at the IV site are common. Check your insertion site every day for signs of infection. Rest as told by your doctor. Go back to your normal activities as told by your doctor. Get help right away if you have signs of a serious reaction. This information is not intended to replace advice given to you by your health care provider. Make sure you discuss any questions you have with your health care provider. Document Revised: 02/28/2022 Document Reviewed: 02/28/2022 Elsevier Patient Education  [employer].

## 2023-02-17 ENCOUNTER — Inpatient Hospital Stay: Payer: Medicare Other

## 2023-02-17 VITALS — BP 126/74 | HR 89 | Temp 98.3°F | Resp 16

## 2023-02-17 DIAGNOSIS — Z5111 Encounter for antineoplastic chemotherapy: Secondary | ICD-10-CM | POA: Diagnosis not present

## 2023-02-17 DIAGNOSIS — Z452 Encounter for adjustment and management of vascular access device: Secondary | ICD-10-CM | POA: Diagnosis not present

## 2023-02-17 DIAGNOSIS — C3411 Malignant neoplasm of upper lobe, right bronchus or lung: Secondary | ICD-10-CM

## 2023-02-17 DIAGNOSIS — D61810 Antineoplastic chemotherapy induced pancytopenia: Secondary | ICD-10-CM | POA: Diagnosis not present

## 2023-02-17 DIAGNOSIS — C787 Secondary malignant neoplasm of liver and intrahepatic bile duct: Secondary | ICD-10-CM | POA: Diagnosis not present

## 2023-02-17 DIAGNOSIS — Z5189 Encounter for other specified aftercare: Secondary | ICD-10-CM | POA: Diagnosis not present

## 2023-02-17 LAB — TYPE AND SCREEN
ABO/RH(D): B POS
Antibody Screen: NEGATIVE
Unit division: 0

## 2023-02-17 LAB — BPAM RBC
Blood Product Expiration Date: 202403132359
ISSUE DATE / TIME: 202403041302
Unit Type and Rh: 7300

## 2023-02-17 MED ORDER — SODIUM CHLORIDE 0.9 % IV SOLN
80.0000 mg/m2 | Freq: Once | INTRAVENOUS | Status: AC
  Administered 2023-02-17: 200 mg via INTRAVENOUS
  Filled 2023-02-17: qty 10

## 2023-02-17 MED ORDER — HEPARIN SOD (PORK) LOCK FLUSH 100 UNIT/ML IV SOLN
500.0000 [IU] | Freq: Once | INTRAVENOUS | Status: AC | PRN
  Administered 2023-02-17: 500 [IU]

## 2023-02-17 MED ORDER — TRILACICLIB DIHYDROCHLORIDE INJECTION 300 MG
240.0000 mg/m2 | Freq: Once | INTRAVENOUS | Status: AC
  Administered 2023-02-17: 600 mg via INTRAVENOUS
  Filled 2023-02-17: qty 40

## 2023-02-17 MED ORDER — SODIUM CHLORIDE 0.9% FLUSH
10.0000 mL | INTRAVENOUS | Status: DC | PRN
  Administered 2023-02-17: 10 mL

## 2023-02-17 MED ORDER — SODIUM CHLORIDE 0.9 % IV SOLN: Freq: Once | INTRAVENOUS | Status: AC

## 2023-02-17 MED ORDER — SODIUM CHLORIDE 0.9 % IV SOLN
10.0000 mg | Freq: Once | INTRAVENOUS | Status: AC
  Administered 2023-02-17: 10 mg via INTRAVENOUS
  Filled 2023-02-17: qty 10

## 2023-02-17 MED FILL — Dexamethasone Sodium Phosphate Inj 100 MG/10ML: INTRAMUSCULAR | Qty: 1 | Status: AC

## 2023-02-17 NOTE — Patient Instructions (Signed)
[facility]  Discharge Instructions: Thank you for choosing [facility] to provide your oncology and hematology care.   If you have a lab appointment with the [facility], please go directly to the [facility] and check in at the registration area.   Wear comfortable clothing and clothing appropriate for easy access to any Portacath or PICC line.   We strive to give you quality time with your provider. You may need to reschedule your appointment if you arrive late (15 or more minutes).  Arriving late affects you and other patients whose appointments are after yours.  Also, if you miss three or more appointments without notifying the office, you may be dismissed from the clinic at the provider's discretion.      For prescription refill requests, have your pharmacy contact our office and allow 72 hours for refills to be completed.    Today you received the following chemotherapy and/or immunotherapy agents: etoposide      To help prevent nausea and vomiting after your treatment, we encourage you to take your nausea medication as directed.  BELOW ARE SYMPTOMS THAT SHOULD BE REPORTED IMMEDIATELY: *FEVER GREATER THAN 100.4 F (38 C) OR HIGHER *CHILLS OR SWEATING *NAUSEA AND VOMITING THAT IS NOT CONTROLLED WITH YOUR NAUSEA MEDICATION *UNUSUAL SHORTNESS OF BREATH *UNUSUAL BRUISING OR BLEEDING *URINARY PROBLEMS (pain or burning when urinating, or frequent urination) *BOWEL PROBLEMS (unusual diarrhea, constipation, pain near the anus) TENDERNESS IN MOUTH AND THROAT WITH OR WITHOUT PRESENCE OF ULCERS (sore throat, sores in mouth, or a toothache) UNUSUAL RASH, SWELLING OR PAIN  UNUSUAL VAGINAL DISCHARGE OR ITCHING   Items with * indicate a potential emergency and should be followed up as soon as possible or go to the Emergency Department if any problems should occur.  Please show the CHEMOTHERAPY ALERT CARD or IMMUNOTHERAPY ALERT CARD at  check-in to the Emergency Department and triage nurse.  Should you have questions after your visit or need to cancel or reschedule your appointment, please contact [facility]  Dept: [phone]  and follow the prompts.  Office hours are 8:00 a.m. to 4:30 p.m. Monday - Friday. Please note that voicemails left after 4:00 p.m. may not be returned until the following business day.  We are closed weekends and major holidays. You have access to a nurse at all times for urgent questions. Please call the main number to the clinic Dept: [phone] and follow the prompts.   For any non-urgent questions, you may also contact your provider using MyChart. We now offer e-Visits for anyone 3 and older to request care online for non-urgent symptoms. For details visit [email or web address].   Also download the MyChart app! Go to the app store, search "MyChart", open the app, select [facility], and log in with your MyChart username and password.

## 2023-02-18 ENCOUNTER — Inpatient Hospital Stay: Payer: Medicare Other

## 2023-02-18 VITALS — BP 122/73 | HR 78 | Resp 18

## 2023-02-18 DIAGNOSIS — Z5111 Encounter for antineoplastic chemotherapy: Secondary | ICD-10-CM | POA: Diagnosis not present

## 2023-02-18 DIAGNOSIS — C3411 Malignant neoplasm of upper lobe, right bronchus or lung: Secondary | ICD-10-CM

## 2023-02-18 DIAGNOSIS — C787 Secondary malignant neoplasm of liver and intrahepatic bile duct: Secondary | ICD-10-CM | POA: Diagnosis not present

## 2023-02-18 DIAGNOSIS — D61810 Antineoplastic chemotherapy induced pancytopenia: Secondary | ICD-10-CM | POA: Diagnosis not present

## 2023-02-18 DIAGNOSIS — Z452 Encounter for adjustment and management of vascular access device: Secondary | ICD-10-CM | POA: Diagnosis not present

## 2023-02-18 DIAGNOSIS — Z5189 Encounter for other specified aftercare: Secondary | ICD-10-CM | POA: Diagnosis not present

## 2023-02-18 MED ORDER — HEPARIN SOD (PORK) LOCK FLUSH 100 UNIT/ML IV SOLN
500.0000 [IU] | Freq: Once | INTRAVENOUS | Status: AC | PRN
  Administered 2023-02-18: 500 [IU]

## 2023-02-18 MED ORDER — SODIUM CHLORIDE 0.9% FLUSH
10.0000 mL | INTRAVENOUS | Status: DC | PRN
  Administered 2023-02-18: 10 mL

## 2023-02-18 MED ORDER — TRILACICLIB DIHYDROCHLORIDE INJECTION 300 MG
240.0000 mg/m2 | Freq: Once | INTRAVENOUS | Status: AC
  Administered 2023-02-18: 600 mg via INTRAVENOUS
  Filled 2023-02-18: qty 40

## 2023-02-18 MED ORDER — SODIUM CHLORIDE 0.9 % IV SOLN
10.0000 mg | Freq: Once | INTRAVENOUS | Status: AC
  Administered 2023-02-18: 10 mg via INTRAVENOUS
  Filled 2023-02-18: qty 10

## 2023-02-18 MED ORDER — SODIUM CHLORIDE 0.9 % IV SOLN: Freq: Once | INTRAVENOUS | Status: AC

## 2023-02-18 MED ORDER — SODIUM CHLORIDE 0.9 % IV SOLN
80.0000 mg/m2 | Freq: Once | INTRAVENOUS | Status: AC
  Administered 2023-02-18: 200 mg via INTRAVENOUS
  Filled 2023-02-18: qty 10

## 2023-02-18 NOTE — Patient Instructions (Signed)
[facility]  Discharge Instructions: Thank you for choosing [facility] to provide your oncology and hematology care.   If you have a lab appointment with the Cancer Center, please go directly to the Cancer Center and check in at the registration area.   Wear comfortable clothing and clothing appropriate for easy access to any Portacath or PICC line.   We strive to give you quality time with your provider. You may need to reschedule your appointment if you arrive late (15 or more minutes).  Arriving late affects you and other patients whose appointments are after yours.  Also, if you miss three or more appointments without notifying the office, you may be dismissed from the clinic at the provider's discretion.      For prescription refill requests, have your pharmacy contact our office and allow 72 hours for refills to be completed.    Today you received the following chemotherapy and/or immunotherapy agents: Cosela, Etoposide.      To help prevent nausea and vomiting after your treatment, we encourage you to take your nausea medication as directed.  BELOW ARE SYMPTOMS THAT SHOULD BE REPORTED IMMEDIATELY: *FEVER GREATER THAN 100.4 F (38 C) OR HIGHER *CHILLS OR SWEATING *NAUSEA AND VOMITING THAT IS NOT CONTROLLED WITH YOUR NAUSEA MEDICATION *UNUSUAL SHORTNESS OF BREATH *UNUSUAL BRUISING OR BLEEDING *URINARY PROBLEMS (pain or burning when urinating, or frequent urination) *BOWEL PROBLEMS (unusual diarrhea, constipation, pain near the anus) TENDERNESS IN MOUTH AND THROAT WITH OR WITHOUT PRESENCE OF ULCERS (sore throat, sores in mouth, or a toothache) UNUSUAL RASH, SWELLING OR PAIN  UNUSUAL VAGINAL DISCHARGE OR ITCHING   Items with * indicate a potential emergency and should be followed up as soon as possible or go to the Emergency Department if any problems should occur.  Please show the CHEMOTHERAPY ALERT CARD or IMMUNOTHERAPY ALERT CARD  at check-in to the Emergency Department and triage nurse.  Should you have questions after your visit or need to cancel or reschedule your appointment, please contact [facility]  Dept: [phone]  and follow the prompts.  Office hours are 8:00 a.m. to 4:30 p.m. Monday - Friday. Please note that voicemails left after 4:00 p.m. may not be returned until the following business day.  We are closed weekends and major holidays. You have access to a nurse at all times for urgent questions. Please call the main number to the clinic Dept: [phone] and follow the prompts.   For any non-urgent questions, you may also contact your provider using MyChart. We now offer e-Visits for anyone 18 and older to request care online for non-urgent symptoms. For details visit [email or web address].   Also download the MyChart app! Go to the app store, search "MyChart", open the app, select [facility], and log in with your MyChart username and password.   

## 2023-02-20 ENCOUNTER — Telehealth: Payer: Self-pay

## 2023-02-20 NOTE — Patient Outreach (Signed)
  Care Coordination   Initial Visit Note   02/20/2023 Name: Ibrohim Simmers MRN: [id number] DOB: 03-09-1951  Gedalia Mcmillon is a 73 y.o. year old male who sees [name], NP for primary care. I spoke with  [name] by phone today.  What matters to the patients health and wellness today?  Patient verbalized no nursing or community resource needs.       Goals Addressed             This Visit's Progress    care coordination activities - no follow up required.       Interventions Today    Flowsheet Row Most Recent Value  General Interventions   General Interventions Discussed/Reviewed General Interventions Discussed  [Care coordination services discussed and offered. SDOH assessment completed.  patient advised to contact his primary care provider if care coordination services needed in the future.]              SDOH assessments and interventions completed:  Yes  SDOH Interventions Today    Flowsheet Row Most Recent Value  SDOH Interventions   Food Insecurity Interventions Intervention Not Indicated  Housing Interventions Intervention Not Indicated  Transportation Interventions Intervention Not Indicated        Care Coordination Interventions:  Yes, provided   Follow up plan: No furthe follow up needed.   Encounter Outcome:  No Answer   Quinn Plowman RN,BSN,CCM [facility] [phone] direct line

## 2023-02-23 NOTE — Therapy (Signed)
OUTPATIENT PHYSICAL THERAPY THORACOLUMBAR TREATMENT   Patient Name: Adam Wise MRN: [id number] DOB:09/05/1951, 72 y.o., male Today's Date: 02/24/2023  END OF SESSION:  PT End of Session - 02/24/23 1429     Visit Number 7    Number of Visits 16    Date for PT Re-Evaluation 03/23/23    Authorization Type 7/10 eval 01/26/23    PT Start Time 1430    PT Stop Time 1514    PT Time Calculation (min) 44 min    Activity Tolerance Patient tolerated treatment well;No increased pain    Behavior During Therapy [facility] for tasks assessed/performed                  Past Medical History:  Diagnosis Date   Chickenpox    Chronic knee pain    Chronic low back pain    COPD exacerbation ([place]) 10/11/2021   COPD with exacerbation ([place]) 10/12/2021   Coronary artery calcification seen on CAT scan 11/2021   Coronary CTA 11/27/2021: Moderate to large right pleural effusion and compressive atelectasis right lung base. => Coronary Calcium Score 657.  Diffuse RCA calcification.  Minimal mild disease in the LAD and diagonal branches. == Overall limited study.  Notable artifact.   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 He has disease progression after the first line of chemotherapy in December 2021.   Testosterone deficiency    Type 2 diabetes mellitus ([place])    Past Surgical History:  Procedure Laterality Date   CHEST TUBE INSERTION Right 01/20/2022   Procedure: CHEST TUBE INSERTION;  Surgeon: Candee Furbish, MD;  Location: [facility] ENDOSCOPY;  Service: Pulmonary;  Laterality: Right;  w/ Talc  Pleurodesis, planned admit for Obs afterwards   CHEST TUBE INSERTION Right 04/28/2022   Procedure: INSERTION PLEURAL DRAINAGE CATHETER - Pigtail tail, drainage;  Surgeon: Candee Furbish, MD;  Location: [facility] ENDOSCOPY;  Service: Pulmonary;  Laterality: Right;  talc pleurodesis   CHEST TUBE INSERTION Right 09/01/2022   Procedure: INSERTION PLEURAL DRAINAGE CATHETER;  Surgeon: Juanito Doom, MD;  Location: [facility];  Service: Cardiopulmonary;  Laterality: Right;  pigtail catheter placement with talc pleurodesis   COLONOSCOPY WITH PROPOFOL N/A 12/17/2018   Procedure: COLONOSCOPY WITH PROPOFOL;  Surgeon: Virgel Manifold, MD;  Location: [facility] ENDOSCOPY;  Service: Gastroenterology;  Laterality: N/A;   IR IMAGING GUIDED PORT INSERTION  04/17/2020   IR THORACENTESIS ASP PLEURAL SPACE W/IMG GUIDE  11/29/2021   IR THORACENTESIS ASP PLEURAL SPACE W/IMG GUIDE  01/03/2022   JOINT REPLACEMENT Bilateral    REPLACEMENT TOTAL KNEE BILATERAL  2015   TALC PLEURODESIS  09/01/2022   Procedure: Pietro Cassis;  Surgeon: Juanito Doom, MD;  Location: [facility] ENDOSCOPY;  Service: Cardiopulmonary;;   [place] ECHOCARDIOGRAM  05/2020   a) 05/2020: EF 55 to 60%.  No R WMA.  Mild LVH.  Indeterminate LVEDP.  Unable to assess RVP.  Normal aortic and mitral valves.  Mildly elevated RAP.; b) 09/2021: EF 50-55%. No RWMA. Mild LVH. ~ LVEDP. Mild LA Dilation. NORMAL RV/RAP.  Normal MV/AoV.   Patient Active  Problem List   Diagnosis Date Noted   Hip pain 12/09/2022   Dizziness 11/12/2022   Chronic respiratory failure with hypoxia (H[facility]) 09/18/2022   Pneumothorax 09/02/2022   Pneumothorax on right 09/01/2022   Recurrent pleural effusion 08/27/2022   Recurrent pleural effusion on right 01/16/2022   Preop cardiovascular exam 12/19/2021   Coronary Calcium Score 657 11/13/2021   Atypical chest pain 11/13/2021   Pleural effusion 10/29/2021   Acute on chronic respiratory failure with hypoxia  ([place]) 10/12/2021   Recurrent right pleural effusion 08/08/2021   Erectile dysfunction 10/26/2020   Port-A-Cath in place 06/11/2020   History of pulmonary embolism 05/15/2020   Small cell lung cancer, right upper lobe ([place]) 04/12/2020   Chronic fatigue 04/12/2020   Encounter for antineoplastic chemotherapy 04/12/2020   Encounter for antineoplastic immunotherapy 04/12/2020   Malignant neoplasm of upper lobe of right lung ([place]) 04/04/2020   Goals of care, counseling/discussion 03/30/2020   Exertional dyspnea 03/26/2020   Hyperlipidemia associated with type 2 diabetes mellitus ([place]) 11/08/2019   Joint swelling 11/16/2018   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   Essential hypertension 09/09/2017   Type 2 diabetes mellitus ([place]) 09/09/2017   GERD (gastroesophageal reflux disease) 09/09/2017   Testosterone deficiency 09/09/2017   OSA (obstructive sleep apnea) 09/09/2017   Chronic bilateral low back pain without sciatica 09/09/2017   H/O knee surgery 04/27/2014   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 Reports his pain has not been relieved. Has attempted all of the exercises without relief.    PERTINENT HISTORY:  Patient is a very pleasant 72 year old male presenting to physical therapy for chronic low back pain without sciatica. Patient has been seen by this therapist in the past. Patient  additionally has relapsed extensive stage small cell lung cancer and now multiple metastatic liver lesions diagnosed in April 2021 with history of radiotherapy and chemotherapy. PMH includes chickenpox COPD, CAD, HTN, GERD, OSA, type II DM, bilateral TKA. Patient tried figure four and stomach pressing up. Also having vertigo.   PAIN:  Are you having pain? 7/10 pain Yes: NPRS scale: worst pain: 10/10; least amount of pain 4/10 current pain 6-7/10 Pain location: R sided low back pain radiating to back of knee; sometimes feeling like wet pants of adduction. Pain description: radiating to back pain Aggravating factors: getting up,  Relieving factors: laying down in bed, bending over the sink  PRECAUTIONS: Other: back, cancer   WEIGHT BEARING RESTRICTIONS: No  FALLS:  Has patient fallen in last 6 months? No  LIVING ENVIRONMENT: Lives with: lives with their spouse Lives in: House/apartment Stairs: Yes: Internal: flight steps; on right going up and on left going up and External: 5 steps; on right going up and on left going up Has following equipment at home: Single point cane, Environmental consultant - 4 wheeled, Electronics engineer, and Grab bars  OCCUPATION: retired IT trainer  PLOF: Independent  PATIENT GOALS: to have less pain  NEXT MD VISIT: n/a  OBJECTIVE:   DIAGNOSTIC FINDINGS:  MR lumbar 2020 Advanced disc degeneration T12-L1 with mild spinal stenosis   Mild spinal stenosis L2-3   Moderate spinal stenosis L3-4 and L4-5  PATIENT SURVEYS:  FOTO 36  SCREENING FOR RED FLAGS: Bowel or bladder incontinence: No Spinal tumors: No Cauda equina syndrome: No Compression fracture: No Abdominal aneurysm: No  COGNITION: Overall cognitive status: Within functional limits for tasks assessed     SENSATION: Hot/Cold: Impaired R medial knee  MUSCLE LENGTH: Hamstrings: Right 25 deg; Left 40 deg   POSTURE: rounded shoulders, increased thoracic kyphosis, posterior pelvic tilt, and flexed trunk    PALPATION: Excessive muscle guarding along lumbar paraspinals and piriformis   LUMBAR ROM:   AROM eval  Flexion limited  Extension limited  Right lateral flexion limited  Left lateral flexion limited  Right rotation limited  Left rotation limited   (Blank rows = not tested)    LOWER EXTREMITY MMT:    MMT Right eval Left eval  Hip flexion 2+* 3  Hip extension    Hip abduction 3* 3  Hip adduction 3-* 3  Knee flexion 3-* 3  Knee extension 3 3  Ankle dorsiflexion 4 4  Ankle plantarflexion 4 4   (Blank rows = not tested)  LUMBAR SPECIAL TESTS:  Straight leg raise test: Positive, SI Compression/distraction test: Positive, FABER test: Negative, and scour negative; distraction positive   FUNCTIONAL TESTS:  5 times sit to stand: unable to perform due to pain 10 meter walk test: unable to perform due to pain Sit to stand: CGA due to extreme pain Supine,<>prone; slow, extra time due to pain  GAIT: Distance walked: 3 ft Assistive device utilized: Single point cane Level of assistance: CGA Comments: severe pain with walking  TODAY'S TREATMENT:                                                                                                                              DATE: 02/24/23   On large mat table:  TherEx:  Hooklying  lower trunk rotation, 2x10 Sciatic nerve glide 20x each LE  Seated  Red ball rollout 10x  Posterior/anterior pelvic tilt 10x RTB adduction 10x each side TrA activation pressing into PT hand 10x Lateral weight shift 5x30 seconds    Manual: Roller to low back X 6 MINUTES Hamstring lengthening stretch 60 seconds each LE Figure four stretch 60 seconds SAD with belt inferior 3x30 seconds RLE SAD abduction 3x30 seconds RLE       PATIENT EDUCATION:  Education details: POC, anterior pelvic stretch Person educated: Patient and Spouse Education method: Explanation, Demonstration, Tactile cues, and Verbal cues Education comprehension:  verbalized understanding, returned demonstration, verbal cues required, and tactile cues required  HOME EXERCISE PROGRAM: Anterior pelvic tilt 10x Hamstring stretch 30 seconds Lay prone 60 seconds   Access Code: [email or web address] URL: [email or web address] Date: 01/29/2023 Prepared by: Janna Arch  Exercises - Seated Anterior Pelvic Tilt  - 1 x daily - 7 x weekly - 2 sets - 10 reps - 5 hold - Supine Anterior Pelvic Tilt  - 1 x daily - 7 x weekly - 2 sets - 10 reps - 5 hold - Seated Hamstring Stretch  - 1 x daily - 7 x weekly - 2 sets - 2 reps - 30 hold - Supine Transversus Abdominis Bracing - Hands on Ground  - 1 x daily - 7 x weekly - 2 sets - 10 reps - 5 hold  ASSESSMENT:  CLINICAL IMPRESSION: Patient has limited symptom relief with PT this session. Recommending patient follow up with orthopedic physician due to limited pain resolution with PT. Patient agreeable to plan. Pt will continue to benefit from skilled therapy to address remaining deficits in order to improve overall QoL and return to PLOF.      OBJECTIVE IMPAIRMENTS: Abnormal gait, cardiopulmonary status limiting activity, decreased activity tolerance, decreased balance, decreased endurance, decreased mobility, difficulty walking, decreased ROM, decreased strength, hypomobility, impaired perceived functional ability, increased muscle spasms, impaired flexibility, impaired sensation, improper body mechanics, postural dysfunction, obesity, and pain.   ACTIVITY LIMITATIONS: carrying, lifting, bending, sitting, standing, squatting, sleeping, stairs, transfers, bed mobility, bathing, toileting, dressing, reach over head, hygiene/grooming, and locomotion level  PARTICIPATION LIMITATIONS: meal prep, cleaning, laundry, medication management, personal finances, interpersonal relationship, driving, shopping, community activity, occupation, yard work, and church  PERSONAL FACTORS: Age, Behavior pattern, Education, Restaurant manager, fast food,  Past/current experiences, Profession, Sex, Social background, Time since onset of injury/illness/exacerbation, and 3+ comorbidities: chickenpox COPD, CAD, HTN, GERD, OSA, type II DM, bilateral TKA  are also affecting patient's functional outcome.   REHAB POTENTIAL: Good  CLINICAL DECISION MAKING: Evolving/moderate complexity  EVALUATION COMPLEXITY: Moderate   GOALS: Goals reviewed with patient? Yes  SHORT TERM GOALS: Target date: 02/23/2023  Patient will be independent in home exercise program to improve strength/mobility for better functional independence with ADLs. Baseline: 2/12: given to patient Goal status: INITIAL  2.  .  Patient will report a worst pain of  5/10 on VAS in low back to improve tolerance with ADLs and reduced symptoms with activities. Baseline: 2/12: 10/10 Goal status: INITIAL    LONG TERM GOALS: Target date: 03/23/2023  Patient will increase FOTO score to equal to or greater than 48%    to demonstrate statistically significant improvement in mobility and quality of life.  Baseline: 2/12: 36% Goal status: INITIAL  2.  Patient will report a worst pain of 3/10 on VAS in low back to improve tolerance with ADLs and reduced symptoms with activities.  Baseline: 2/12: 10/10 Goal status: INITIAL  3.  Patient will increase 10 meter walk test to >1.27ms as to improve gait speed for better community ambulation and to reduce fall risk Baseline: 2/12: unable to ambulate 10 meters Goal status: INITIAL  4.  Patient (> 674years old) will complete five times sit to stand test in < 15 seconds indicating an increased LE strength and improved balance. Baseline: 2/12: unable to perform due to severe pain Goal status: INITIAL    PLAN:  PT FREQUENCY: 2x/week  PT DURATION: 8 weeks  PLANNED INTERVENTIONS: Therapeutic exercises, Therapeutic activity, Neuromuscular re-education, Balance training, Gait training, Patient/Family education, Self Care, Joint mobilization, Stair  training, Vestibular training, Canalith repositioning, Orthotic/Fit training, DME instructions, Electrical stimulation, Spinal manipulation, Spinal mobilization, Cryotherapy, Moist heat, Splintting, Taping, Vasopneumatic device, Traction, Ultrasound, Manual therapy, and Re-evaluation.  PLAN FOR NEXT SESSION:   anterior tilting, reducing muscle tension, core and LE strengthening.   MJanna ArchPT  Physical Therapist - [facility] 02/24/23, 8:07 PM

## 2023-02-24 ENCOUNTER — Ambulatory Visit: Payer: Medicare Other | Attending: Primary Care

## 2023-02-24 DIAGNOSIS — M6281 Muscle weakness (generalized): Secondary | ICD-10-CM | POA: Diagnosis not present

## 2023-02-24 DIAGNOSIS — R262 Difficulty in walking, not elsewhere classified: Secondary | ICD-10-CM

## 2023-02-24 DIAGNOSIS — M5459 Other low back pain: Secondary | ICD-10-CM

## 2023-02-25 ENCOUNTER — Ambulatory Visit (INDEPENDENT_AMBULATORY_CARE_PROVIDER_SITE_OTHER): Payer: Medicare Other

## 2023-02-25 ENCOUNTER — Inpatient Hospital Stay: Payer: Medicare Other

## 2023-02-25 VITALS — Ht 68.0 in | Wt 295.0 lb

## 2023-02-25 DIAGNOSIS — D6481 Anemia due to antineoplastic chemotherapy: Secondary | ICD-10-CM

## 2023-02-25 DIAGNOSIS — C787 Secondary malignant neoplasm of liver and intrahepatic bile duct: Secondary | ICD-10-CM | POA: Diagnosis not present

## 2023-02-25 DIAGNOSIS — C3411 Malignant neoplasm of upper lobe, right bronchus or lung: Secondary | ICD-10-CM

## 2023-02-25 DIAGNOSIS — Z5189 Encounter for other specified aftercare: Secondary | ICD-10-CM | POA: Diagnosis not present

## 2023-02-25 DIAGNOSIS — Z5111 Encounter for antineoplastic chemotherapy: Secondary | ICD-10-CM | POA: Diagnosis not present

## 2023-02-25 DIAGNOSIS — Z0000 Encounter for general adult medical examination without abnormal findings: Secondary | ICD-10-CM | POA: Diagnosis not present

## 2023-02-25 DIAGNOSIS — Z452 Encounter for adjustment and management of vascular access device: Secondary | ICD-10-CM | POA: Diagnosis not present

## 2023-02-25 DIAGNOSIS — Z95828 Presence of other vascular implants and grafts: Secondary | ICD-10-CM

## 2023-02-25 DIAGNOSIS — D61810 Antineoplastic chemotherapy induced pancytopenia: Secondary | ICD-10-CM | POA: Diagnosis not present

## 2023-02-25 DIAGNOSIS — G8929 Other chronic pain: Secondary | ICD-10-CM

## 2023-02-25 LAB — CBC WITH DIFFERENTIAL (CANCER CENTER ONLY)
Abs Immature Granulocytes: 0 10*3/uL (ref 0.00–0.07)
Basophils Absolute: 0 10*3/uL (ref 0.0–0.1)
Basophils Relative: 0 %
Eosinophils Absolute: 0 10*3/uL (ref 0.0–0.5)
Eosinophils Relative: 0 %
HCT: 24.4 % — ABNORMAL LOW (ref 39.0–52.0)
Hemoglobin: 8.2 g/dL — ABNORMAL LOW (ref 13.0–17.0)
Lymphocytes Relative: 21 %
Lymphs Abs: 0.4 10*3/uL — ABNORMAL LOW (ref 0.7–4.0)
MCH: 31.8 pg (ref 26.0–34.0)
MCHC: 33.6 g/dL (ref 30.0–36.0)
MCV: 94.6 fL (ref 80.0–100.0)
Monocytes Absolute: 0.3 10*3/uL (ref 0.1–1.0)
Monocytes Relative: 15 %
Neutro Abs: 1.1 10*3/uL — ABNORMAL LOW (ref 1.7–7.7)
Neutrophils Relative %: 64 %
Platelet Count: 46 10*3/uL — ABNORMAL LOW (ref 150–400)
RBC: 2.58 MIL/uL — ABNORMAL LOW (ref 4.22–5.81)
RDW: 15.9 % — ABNORMAL HIGH (ref 11.5–15.5)
WBC Count: 1.7 10*3/uL — ABNORMAL LOW (ref 4.0–10.5)
nRBC: 0 % (ref 0.0–0.2)

## 2023-02-25 LAB — SAMPLE TO BLOOD BANK

## 2023-02-25 MED ORDER — SODIUM CHLORIDE 0.9% FLUSH
10.0000 mL | Freq: Once | INTRAVENOUS | Status: AC
  Administered 2023-02-25: 10 mL

## 2023-02-25 MED ORDER — HEPARIN SOD (PORK) LOCK FLUSH 100 UNIT/ML IV SOLN
500.0000 [IU] | Freq: Once | INTRAVENOUS | Status: AC
  Administered 2023-02-25: 500 [IU]

## 2023-02-25 NOTE — Progress Notes (Signed)
I connected with  Adam Wise on 02/25/23 by a audio enabled telemedicine application and verified that I am speaking with the correct person using two identifiers.  Patient Location: Home  Provider Location: Home Office  I discussed the limitations of evaluation and management by telemedicine. The patient expressed understanding and agreed to proceed.  Subjective:   Adam Wise is a 72 y.o. male who presents for Medicare Annual/Subsequent preventive examination.  Review of Systems      Cardiac Risk Factors include: advanced age (>68mn, >>15women);hypertension;diabetes mellitus;sedentary lifestyle     Objective:    Today's Vitals   02/25/23 1356 02/25/23 1359  Weight: 295 lb (133.8 kg)   Height: '5\' 8"'$  (1.727 m)   PainSc:  7    Body mass index is 44.85 kg/m.     02/25/2023    2:15 PM 02/16/2023   10:55 AM 02/05/2023    8:49 AM 01/26/2023    5:16 PM 12/10/2022    1:35 PM 12/09/2022    9:16 AM 11/25/2022   12:52 PM  Advanced Directives  Does Patient Have a Medical Advance Directive? Yes Yes Yes Yes Yes Yes Yes  Type of AParamedicof [place] will      Living will;Healthcare Power of Attorney  Does patient want to make changes to medical advance directive? No - Patient declined No - Patient declined No - Patient declined  No - Patient declined No - Patient declined   Copy of [place] Chart? Yes - validated most recent copy scanned in chart (See row information)        Would patient like information on creating a medical advance directive?     No - Patient declined      Current Medications (verified) Outpatient Encounter Medications as of 02/25/2023  Medication Sig   albuterol (VENTOLIN HFA) 108 (90 Base) MCG/ACT inhaler Inhale 2 puffs into the lungs every 6 (six) hours as needed for wheezing or shortness of breath.   amLODipine (NORVASC) 10 MG tablet TAKE 1 TABLET BY MOUTH EVERY DAY FOR BLOOD PRESSURE   bisoprolol  (ZEBETA) 5 MG tablet TAKE 1 TABLET (5 MG TOTAL) BY MOUTH DAILY.   Black Pepper-Turmeric (TURMERIC COMPLEX/BLACK PEPPER PO) Take 1 tablet by mouth in the morning and at bedtime.   Coenzyme Q10 (COQ10) 200 MG CAPS Take 200 mg by mouth at bedtime.   ELIQUIS 5 MG TABS tablet TAKE 1 TABLET BY MOUTH TWICE A DAY   esomeprazole (NEXIUM) 20 MG capsule Take 20 mg by mouth in the morning.   gabapentin (NEURONTIN) 300 MG capsule TAKE 1 CAPSULE (300 MG TOTAL) BY MOUTH 2 (TWO) TIMES DAILY. FOR BACK PAIN.   glipiZIDE (GLUCOTROL) 10 MG tablet TAKE 1 TABLET (10 MG TOTAL) BY MOUTH 2 (TWO) TIMES DAILY BEFORE A MEAL. FOR DIABETES.   ipratropium-albuterol (DUONEB) 0.5-2.5 (3) MG/3ML SOLN Take 3 mLs by nebulization every 4 (four) hours while awake for 3 days, THEN 3 mLs every 4 (four) hours as needed (shortness of breath or wheezing).   KRILL OIL PO Take 1 capsule by mouth daily.   losartan (COZAAR) 50 MG tablet TAKE 1 TABLET (50 MG TOTAL) BY MOUTH DAILY. FOR BLOOD PRESSURE.   Melatonin 10 MG CAPS Take 10 mg by mouth at bedtime.   metFORMIN (GLUCOPHAGE) 1000 MG tablet TAKE 1 TABLET (1,000 MG TOTAL) BY MOUTH 2 (TWO) TIMES DAILY WITH A MEAL. FOR DIABETES   Multiple Vitamin (MULTI-VITAMINS) TABS Take 1 tablet by mouth  in the morning.   oxymetazoline (AFRIN) 0.05 % nasal spray Place 1 spray into both nostrils daily as needed for congestion.   pioglitazone (ACTOS) 45 MG tablet TAKE 1 TABLET (45 MG TOTAL) BY MOUTH DAILY. FOR DIABETES.   pravastatin (PRAVACHOL) 40 MG tablet TAKE 1 TABLET BY MOUTH EVERY DAY IN THE EVENING FOR CHOLESTEROL   testosterone cypionate (DEPOTESTOSTERONE CYPIONATE) 200 MG/ML injection Inject 200 mg into the muscle every 14 (fourteen) days.   traMADol (ULTRAM) 50 MG tablet Take 1 tablet (50 mg total) by mouth at bedtime as needed for severe pain.   B-D 3CC LUER-LOK SYR 22GX1" 22G X 1" 3 ML MISC USE AS INSTRUCTED FOR TESTOSTERONE INJECTION EVERY 2 WEEKS   blood glucose meter kit and supplies KIT  Dispense based on patient and insurance preference. Use up to four times daily as directed.   Budeson-Glycopyrrol-Formoterol (BREZTRI AEROSPHERE) 160-9-4.8 MCG/ACT AERO Inhale 2 puffs into the lungs in the morning and at bedtime. (Patient not taking: Reported on 02/25/2023)   chlorpheniramine-HYDROcodone (TUSSIONEX) 10-8 MG/5ML Take 5 mLs by mouth every 12 (twelve) hours as needed for cough. (Patient not taking: Reported on 02/25/2023)   glucose blood (ONETOUCH ULTRA) test strip USE UP TO 4 TIMES DAILY AS DIRECTED   Insulin Glargine (BASAGLAR KWIKPEN) 100 UNIT/ML Inject 24 Units into the skin at bedtime. For diabetes.   Insulin Pen Needle (BD PEN NEEDLE NANO U/F) 32G X 4 MM MISC Use with insulin as prescribed Dx Code: E11.9   Lancets MISC USE UP TO 3 TIMES DAILY AS DIRECTED   No facility-administered encounter medications on file as of 02/25/2023.    Allergies (verified) Bupropion and Hydrochlorothiazide   History: Past Medical History:  Diagnosis Date   Chickenpox    Chronic knee pain    Chronic low back pain    COPD exacerbation ([place]) 10/11/2021   COPD with exacerbation ([place]) 10/12/2021   Coronary artery calcification seen on CAT scan 11/2021   Coronary CTA 11/27/2021: Moderate to large right pleural effusion and compressive atelectasis right lung base. => Coronary Calcium Score 657.  Diffuse RCA calcification.  Minimal mild disease in the LAD and diagonal branches. == Overall limited study.  Notable artifact.   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 He has disease progression after  the first line of chemotherapy in December 2021.   Testosterone deficiency    Type 2 diabetes mellitus ([place])    Past Surgical History:  Procedure Laterality Date   CHEST TUBE INSERTION Right 01/20/2022   Procedure: CHEST TUBE INSERTION;  Surgeon: Candee Furbish, MD;  Location: [facility] ENDOSCOPY;  Service: Pulmonary;  Laterality: Right;  w/ Talc Pleurodesis, planned admit for Obs afterwards   CHEST TUBE INSERTION Right 04/28/2022   Procedure: INSERTION PLEURAL DRAINAGE CATHETER - Pigtail tail, drainage;  Surgeon: Candee Furbish, MD;  Location: [facility] ENDOSCOPY;  Service: Pulmonary;  Laterality: Right;  talc pleurodesis   CHEST TUBE INSERTION Right 09/01/2022   Procedure: INSERTION PLEURAL DRAINAGE CATHETER;  Surgeon: Juanito Doom, MD;  Location: [facility];  Service: Cardiopulmonary;  Laterality: Right;  pigtail catheter placement with  talc pleurodesis   COLONOSCOPY WITH PROPOFOL N/A 12/17/2018   Procedure: COLONOSCOPY WITH PROPOFOL;  Surgeon: Virgel Manifold, MD;  Location: [facility] ENDOSCOPY;  Service: Gastroenterology;  Laterality: N/A;   IR IMAGING GUIDED PORT INSERTION  04/17/2020   IR THORACENTESIS ASP PLEURAL SPACE W/IMG GUIDE  11/29/2021   IR THORACENTESIS ASP PLEURAL SPACE W/IMG GUIDE  01/03/2022   JOINT REPLACEMENT Bilateral    REPLACEMENT TOTAL KNEE BILATERAL  2015   TALC PLEURODESIS  09/01/2022   Procedure: Pietro Cassis;  Surgeon: Juanito Doom, MD;  Location: [facility] ENDOSCOPY;  Service: Cardiopulmonary;;   [place] ECHOCARDIOGRAM  05/2020   a) 05/2020: EF 55 to 60%.  No R WMA.  Mild LVH.  Indeterminate LVEDP.  Unable to assess RVP.  Normal aortic and mitral valves.  Mildly elevated RAP.; b) 09/2021: EF 50-55%. No RWMA. Mild LVH. ~ LVEDP. Mild LA Dilation. NORMAL RV/RAP.  Normal MV/AoV.   Family History  Problem Relation Age of Onset   Heart attack Mother    Cancer Mother    Hypertension Mother    Arthritis Father    Asthma Father    Cancer Father     COPD Father    Heart attack Father    Hypertension Sister    Cancer Sister    Diabetes Sister    Asthma Sister    Cancer Sister    COPD Sister    Arthritis Sister    Asthma Sister    Diabetes Sister    Birth defects Maternal Grandfather    Arthritis Paternal Grandmother    Diabetes Paternal Grandmother    Arthritis Paternal Grandfather    Asthma Son    Other Neg Hx        pituitary disorder   Social History   Socioeconomic History   Marital status: Married    Spouse name: Not on file   Number of children: Not on file   Years of education: Not on file   Highest education level: Not on file  Occupational History   Not on file  Tobacco Use   Smoking status: Former   Smokeless tobacco: Never  Vaping Use   Vaping Use: Never used  Substance and Sexual Activity   Alcohol use: Not Currently    Comment: rarely   Drug use: Not Currently   Sexual activity: Yes  Other Topics Concern   Not on file  Social History Narrative   Married.   Moved from [place].   Retired.   Social Determinants of Health   Financial Resource Strain: Low Risk  (02/25/2023)   Overall Financial Resource Strain (CARDIA)    Difficulty of Paying Living Expenses: Not hard at all  Food Insecurity: No Food Insecurity (02/25/2023)   Hunger Vital Sign    Worried About Running Out of Food in the Last Year: Never true    Ran Out of Food in the Last Year: Never true  Transportation Needs: No Transportation Needs (02/25/2023)   PRAPARE - Hydrologist (Medical): No    Lack of Transportation (Non-Medical): No  Physical Activity: Inactive (02/25/2023)   Exercise Vital Sign    Days of Exercise per Week: 0 days    Minutes of Exercise per Session: 0 min  Stress: No Stress Concern Present (02/25/2023)   [place]    Feeling of Stress : Not at all  Social Connections: Moderately Isolated (02/25/2023)   Social Connection  and Isolation Panel [NHANES]    Frequency of Communication with Friends and Family: Twice a week    Frequency of Social Gatherings with Friends and Family: Twice a week    Attends Religious Services: Never    Marine scientist or Organizations: No    Attends Music therapist: Never    Marital Status: Married    Tobacco Counseling Counseling given: Not Answered   Clinical Intake:  Pre-visit preparation completed: Yes  Pain : 0-10 Pain Score: 7  Pain Type: Chronic pain Pain Location: Back Pain Orientation: Lower Pain Frequency: Other (Comment) (over a year under orthopedic care)     Nutritional Risks: None Diabetes: Yes CBG done?: No Did pt. bring in CBG monitor from home?: No  How often do you need to have someone help you when you read instructions, pamphlets, or other written materials from your doctor or pharmacy?: 1 - Never  Diabetic?Nutrition Risk Assessment:  Has the patient had any N/V/D within the last 2 months?  No  Does the patient have any non-healing wounds?  No  Has the patient had any unintentional weight loss or weight gain?  No   Diabetes:  Is the patient diabetic?  Yes  If diabetic, was a CBG obtained today?  No  Did the patient bring in their glucometer from home?  No  How often do you monitor your CBG's? BID.   Financial Strains and Diabetes Management:  Are you having any financial strains with the device, your supplies or your medication? No .  Does the patient want to be seen by Chronic Care Management for management of their diabetes?  No  Would the patient like to be referred to a Nutritionist or for Diabetic Management?  No   Diabetic Exams:  Diabetic Eye Exam: Overdue for diabetic eye exam. Pt has been advised about the importance in completing this exam. Patient advised to call and schedule an eye exam. Diabetic Foot Exam: Overdue, Pt has been advised about the importance in completing this exam. Pt is scheduled for  diabetic foot exam on no scheduled. PCP checks.  Interpreter Needed?: No  Information entered by :: C.Loc Feinstein LPN   Activities of Daily Living    02/25/2023    2:15 PM 01/01/2023    1:02 PM  In your present state of health, do you have any difficulty performing the following activities:  Hearing? 0 0  Vision? 0 0  Difficulty concentrating or making decisions? 0 0  Walking or climbing stairs? 0 1  Dressing or bathing? 0 0  Doing errands, shopping? 0   Preparing Food and eating ? N   Using the Toilet? N   In the past six months, have you accidently leaked urine? N   Do you have problems with loss of bowel control? N   Managing your Medications? N   Managing your Finances? N   Housekeeping or managing your Housekeeping? N     Patient Care Team: [name], NP as PCP - General (Internal Medicine) Leonie Man, MD as PCP - Cardiology (Cardiology) Valrie Hart, RN as Oncology Nurse Navigator  Indicate any recent Medical Services you may have received from other than [facility] providers in the past year (date may be approximate).     Assessment:   This is a routine wellness examination for [place].  Hearing/Vision screen Hearing Screening - Comments:: No aids Vision Screening - Comments:: Glasses - [facility] [place]  Dietary issues and exercise  activities discussed: Current Exercise Habits: The patient does not participate in regular exercise at present, Exercise limited by: orthopedic condition(s) (back pain)   Goals Addressed             This Visit's Progress    Patient Stated       Be able to mow grass.       Depression Screen    02/25/2023    2:14 PM 02/14/2022    2:56 PM 08/13/2021   10:41 AM 02/13/2021    3:27 PM 01/28/2021   10:44 AM 11/01/2019   12:06 PM 10/27/2018   11:14 AM  PHQ 2/9 Scores  PHQ - 2 Score 0 0 0 0 0 0 0  PHQ- 9 Score   0 0 2 0 0    Fall Risk    02/25/2023    2:06 PM 02/14/2022    2:55 PM 02/13/2021    3:27 PM  01/28/2021   10:44 AM 11/01/2019   12:06 PM  [place] in the past year? 0 0 0 0 0  Number falls in past yr: 0 0 0 0 0  Injury with Fall? 0 0 0 0 0  Risk for fall due to : No Fall Risks No Fall Risks Medication side effect  Medication side effect  Follow up Falls prevention discussed;Falls evaluation completed Falls prevention discussed Falls evaluation completed;Falls prevention discussed  Falls evaluation completed;Falls prevention discussed    FALL RISK PREVENTION PERTAINING TO THE HOME:  Any stairs in or around the home? Yes  If so, are there any without handrails? No  Home free of loose throw rugs in walkways, pet beds, electrical cords, etc? Yes  Adequate lighting in your home to reduce risk of falls? Yes   ASSISTIVE DEVICES UTILIZED TO PREVENT FALLS:  Life alert? No  Use of a cane, walker or w/c? Yes  Grab bars in the bathroom? Yes  Shower chair or bench in shower? Yes  Elevated toilet seat or a handicapped toilet? Yes    Cognitive Function:    02/13/2021    3:29 PM 11/01/2019   12:08 PM 10/27/2018   11:14 AM  MMSE - Mini Mental State Exam  Not completed: Refused    Orientation to time  5 5  Orientation to Place  5 5  Registration  3 3  Attention/ Calculation  5 0  Recall  3 3  Language- name 2 objects   0  Language- repeat  1 1  Language- follow 3 step command   3  Language- read & follow direction   0  Write a sentence   0  Copy design   0  Total score   20        02/25/2023    2:16 PM  6CIT Screen  What Year? 0 points  What month? 0 points  What time? 0 points  Count back from 20 0 points  Months in reverse 0 points  Repeat phrase 0 points  Total Score 0 points    Immunizations Immunization History  Administered Date(s) Administered   Fluad Quad(high Dose 65+) 10/26/2020, 08/13/2021, 10/30/2022   Influenza Inj Mdck Quad Pf 09/18/2017   Influenza,inj,Quad PF,6+ Mos 10/27/2018, 11/08/2019   Influenza-Unspecified 09/18/2017    PFIZER(Purple Top)SARS-COV-2 Vaccination 03/12/2020, 04/02/2020, 12/11/2020   Pneumococcal Conjugate-13 08/17/2015   Pneumococcal Polysaccharide-23 11/25/2012, 10/27/2018   Td 08/27/2016    TDAP status: Up to date  Flu Vaccine status: Up to date  Pneumococcal vaccine status:  Up to date  Covid-19 vaccine status: Declined, Education has been provided regarding the importance of this vaccine but patient still declined. Advised may receive this vaccine at local pharmacy or Health Dept.or vaccine clinic. Aware to provide a copy of the vaccination record if obtained from local pharmacy or Health Dept. Verbalized acceptance and understanding.  Qualifies for Shingles Vaccine? Yes   Zostavax completed  no   Shingrix Completed?: No.    Education has been provided regarding the importance of this vaccine. Patient has been advised to call insurance company to determine out of pocket expense if they have not yet received this vaccine. Advised may also receive vaccine at local pharmacy or Health Dept. Verbalized acceptance and understanding.  Screening Tests Health Maintenance  Topic Date Due   Diabetic kidney evaluation - Urine ACR  Never done   OPHTHALMOLOGY EXAM  11/15/2019   FOOT EXAM  08/13/2022   HEMOGLOBIN A1C  03/26/2023   Diabetic kidney evaluation - eGFR measurement  02/16/2024   Medicare Annual Wellness (AWV)  02/25/2024   DTaP/Tdap/Td (2 - Tdap) 08/27/2026   Pneumonia Vaccine 18+ Years old  Completed   INFLUENZA VACCINE  Completed   Hepatitis C Screening  Completed   HPV VACCINES  Aged Out   COLONOSCOPY (Pts 45-70yr Insurance coverage will need to be confirmed)  Discontinued   COVID-19 Vaccine  Discontinued   Zoster Vaccines- Shingrix  Discontinued    Health Maintenance  Health Maintenance Due  Topic Date Due   Diabetic kidney evaluation - Urine ACR  Never done   OPHTHALMOLOGY EXAM  11/15/2019   FOOT EXAM  08/13/2022    Colorectal cancer screening: No longer required.   Per Pt, undergoing treatment for coloncancer.  Lung Cancer Screening: (Low Dose CT Chest recommended if Age 73-80years, 30 pack-year currently smoking OR have quit w/in 15years.) does not qualify.   Lung Cancer Screening Referral: no  Additional Screening:  Hepatitis C Screening: does not qualify; Completed 10/27/18  Vision Screening: Recommended annual ophthalmology exams for early detection of glaucoma and other disorders of the eye. Is the patient up to date with their annual eye exam?  No  Who is the provider or what is the name of the office in which the patient attends annual eye exams? [facility] RabonIf pt is not established with a provider, would they like to be referred to a provider to establish care? No .   Dental Screening: Recommended annual dental exams for proper oral hygiene  Community Resource Referral / Chronic Care Management: CRR required this visit?  No   CCM required this visit?  No      Plan:     I have personally reviewed and noted the following in the patient's chart:   Medical and social history Use of alcohol, tobacco or illicit drugs  Current medications and supplements including opioid prescriptions. Patient is currently taking opioid prescriptions. Information provided to patient regarding non-opioid alternatives. Patient advised to discuss non-opioid treatment plan with their provider. Functional ability and status Nutritional status Physical activity Advanced directives List of other physicians Hospitalizations, surgeries, and ER visits in previous 12 months Vitals Screenings to include cognitive, depression, and falls Referrals and appointments  In addition, I have reviewed and discussed with patient certain preventive protocols, quality metrics, and best practice recommendations. A written personalized care plan for preventive services as well as general preventive health recommendations were provided to patient.     Garritt Molyneux MMoody Bruins LPN  02/25/2023   Nurse Notes: Pt declines shingles and covid vaccinations.

## 2023-02-25 NOTE — Patient Instructions (Signed)
Adam Wise , Thank you for taking time to come for your Medicare Wellness Visit. I appreciate your ongoing commitment to your health goals. Please review the following plan we discussed and let me know if I can assist you in the future.   These are the goals we discussed:  Goals      care coordination activities - no follow up required.     Interventions Today    Flowsheet Row Most Recent Value  General Interventions   General Interventions Discussed/Reviewed General Interventions Discussed  [Care coordination services discussed and offered. SDOH assessment completed.  patient advised to contact his primary care provider if care coordination services needed in the future.]           Increase physical activity     Starting 10/27/2018, I will attempt to exercise for 60 minutes 3 days per week.      Patient Stated     11/01/2019, I will maintain and continue medications as prescribed.      Patient Stated     02/13/2021, I will maintain and continue medications as prescribed.     Patient Stated     Would like to maintain current routine      Patient Stated     Be able to mow grass.        This is a list of the screening recommended for you and due dates:  Health Maintenance  Topic Date Due   Yearly kidney health urinalysis for diabetes  Never done   Eye exam for diabetics  11/15/2019   Complete foot exam   08/13/2022   Hemoglobin A1C  03/26/2023   Yearly kidney function blood test for diabetes  02/16/2024   Medicare Annual Wellness Visit  02/25/2024   DTaP/Tdap/Td vaccine (2 - Tdap) 08/27/2026   Pneumonia Vaccine  Completed   Flu Shot  Completed   Hepatitis C Screening: USPSTF Recommendation to screen - Ages 18-79 yo.  Completed   HPV Vaccine  Aged Out   Colon Cancer Screening  Discontinued   COVID-19 Vaccine  Discontinued   Zoster (Shingles) Vaccine  Discontinued    Advanced directives: copy in the chart.  Conditions/risks identified: Aim for 30 minutes of exercise  or brisk walking, 6-8 glasses of water, and 5 servings of fruits and vegetables each day.   Next appointment: Follow up in one year for your annual wellness visit. 03/01/2024 @ 2:15 via telephone.  Preventive Care 3 Years and Older, Male  Preventive care refers to lifestyle choices and visits with your health care provider that can promote health and wellness. What does preventive care include? A yearly physical exam. This is also called an annual well check. Dental exams once or twice a year. Routine eye exams. Ask your health care provider how often you should have your eyes checked. Personal lifestyle choices, including: Daily care of your teeth and gums. Regular physical activity. Eating a healthy diet. Avoiding tobacco and drug use. Limiting alcohol use. Practicing safe sex. Taking low doses of aspirin every day. Taking vitamin and mineral supplements as recommended by your health care provider. What happens during an annual well check? The services and screenings done by your health care provider during your annual well check will depend on your age, overall health, lifestyle risk factors, and family history of disease. Counseling  Your health care provider may ask you questions about your: Alcohol use. Tobacco use. Drug use. Emotional well-being. Home and relationship well-being. Sexual activity. Eating habits. History of falls.  Memory and ability to understand (cognition). Work and work Statistician. Screening  You may have the following tests or measurements: Height, weight, and BMI. Blood pressure. Lipid and cholesterol levels. These may be checked every 5 years, or more frequently if you are over 54 years old. Skin check. Lung cancer screening. You may have this screening every year starting at age 93 if you have a 30-pack-year history of smoking and currently smoke or have quit within the past 15 years. Fecal occult blood test (FOBT) of the stool. You may have  this test every year starting at age 68. Flexible sigmoidoscopy or colonoscopy. You may have a sigmoidoscopy every 5 years or a colonoscopy every 10 years starting at age 57. Prostate cancer screening. Recommendations will vary depending on your family history and other risks. Hepatitis C blood test. Hepatitis B blood test. Sexually transmitted disease (STD) testing. Diabetes screening. This is done by checking your blood sugar (glucose) after you have not eaten for a while (fasting). You may have this done every 1-3 years. Abdominal aortic aneurysm (AAA) screening. You may need this if you are a current or former smoker. Osteoporosis. You may be screened starting at age 69 if you are at high risk. Talk with your health care provider about your test results, treatment options, and if necessary, the need for more tests. Vaccines  Your health care provider may recommend certain vaccines, such as: Influenza vaccine. This is recommended every year. Tetanus, diphtheria, and acellular pertussis (Tdap, Td) vaccine. You may need a Td booster every 10 years. Zoster vaccine. You may need this after age 65. Pneumococcal 13-valent conjugate (PCV13) vaccine. One dose is recommended after age 57. Pneumococcal polysaccharide (PPSV23) vaccine. One dose is recommended after age 27. Talk to your health care provider about which screenings and vaccines you need and how often you need them. This information is not intended to replace advice given to you by your health care provider. Make sure you discuss any questions you have with your health care provider. Document Released: 12/28/2015 Document Revised: 08/20/2016 Document Reviewed: 10/02/2015 Elsevier Interactive Patient Education  2017 [place] Prevention in the Home Falls can cause injuries. They can happen to people of all ages. There are many things you can do to make your home safe and to help prevent falls. What can I do on the outside of my  home? Regularly fix the edges of walkways and driveways and fix any cracks. Remove anything that might make you trip as you walk through a door, such as a raised step or threshold. Trim any bushes or trees on the path to your home. Use bright outdoor lighting. Clear any walking paths of anything that might make someone trip, such as rocks or tools. Regularly check to see if handrails are loose or broken. Make sure that both sides of any steps have handrails. Any raised decks and porches should have guardrails on the edges. Have any leaves, snow, or ice cleared regularly. Use sand or salt on walking paths during winter. Clean up any spills in your garage right away. This includes oil or grease spills. What can I do in the bathroom? Use night lights. Install grab bars by the toilet and in the tub and shower. Do not use towel bars as grab bars. Use non-skid mats or decals in the tub or shower. If you need to sit down in the shower, use a plastic, non-slip stool. Keep the floor dry. Clean up any water that spills  on the floor as soon as it happens. Remove soap buildup in the tub or shower regularly. Attach bath mats securely with double-sided non-slip rug tape. Do not have throw rugs and other things on the floor that can make you trip. What can I do in the bedroom? Use night lights. Make sure that you have a light by your bed that is easy to reach. Do not use any sheets or blankets that are too big for your bed. They should not hang down onto the floor. Have a firm chair that has side arms. You can use this for support while you get dressed. Do not have throw rugs and other things on the floor that can make you trip. What can I do in the kitchen? Clean up any spills right away. Avoid walking on wet floors. Keep items that you use a lot in easy-to-reach places. If you need to reach something above you, use a strong step stool that has a grab bar. Keep electrical cords out of the way. Do  not use floor polish or wax that makes floors slippery. If you must use wax, use non-skid floor wax. Do not have throw rugs and other things on the floor that can make you trip. What can I do with my stairs? Do not leave any items on the stairs. Make sure that there are handrails on both sides of the stairs and use them. Fix handrails that are broken or loose. Make sure that handrails are as long as the stairways. Check any carpeting to make sure that it is firmly attached to the stairs. Fix any carpet that is loose or worn. Avoid having throw rugs at the top or bottom of the stairs. If you do have throw rugs, attach them to the floor with carpet tape. Make sure that you have a light switch at the top of the stairs and the bottom of the stairs. If you do not have them, ask someone to add them for you. What else can I do to help prevent falls? Wear shoes that: Do not have high heels. Have rubber bottoms. Are comfortable and fit you well. Are closed at the toe. Do not wear sandals. If you use a stepladder: Make sure that it is fully opened. Do not climb a closed stepladder. Make sure that both sides of the stepladder are locked into place. Ask someone to hold it for you, if possible. Clearly mark and make sure that you can see: Any grab bars or handrails. First and last steps. Where the edge of each step is. Use tools that help you move around (mobility aids) if they are needed. These include: Canes. Walkers. Scooters. Crutches. Turn on the lights when you go into a dark area. Replace any light bulbs as soon as they burn out. Set up your furniture so you have a clear path. Avoid moving your furniture around. If any of your floors are uneven, fix them. If there are any pets around you, be aware of where they are. Review your medicines with your doctor. Some medicines can make you feel dizzy. This can increase your chance of falling. Ask your doctor what other things that you can do to  help prevent falls. This information is not intended to replace advice given to you by your health care provider. Make sure you discuss any questions you have with your health care provider. Document Released: 09/27/2009 Document Revised: 05/08/2016 Document Reviewed: 01/05/2015 Elsevier Interactive Patient Education  2017 [employer].

## 2023-02-26 ENCOUNTER — Encounter: Payer: Self-pay | Admitting: *Deleted

## 2023-02-26 NOTE — Therapy (Signed)
OUTPATIENT PHYSICAL THERAPY THORACOLUMBAR TREATMENT   Patient Name: Adam Wise MRN: [id number] DOB:[birth year], 72 y.o., male Today's Date: 03/02/2023  END OF SESSION:  PT End of Session - 03/02/23 0839     Visit Number 8    Number of Visits 16    Date for PT Re-Evaluation 03/23/23    Authorization Type 8/10 eval 01/26/23    PT Start Time 0845    PT Stop Time 0929    PT Time Calculation (min) 44 min    Activity Tolerance Patient tolerated treatment well;No increased pain    Behavior During Therapy [facility] for tasks assessed/performed                   Past Medical History:  Diagnosis Date   Chickenpox    Chronic knee pain    Chronic low back pain    COPD exacerbation ([place]) 10/11/2021   COPD with exacerbation ([place]) 10/12/2021   Coronary artery calcification seen on CAT scan 11/2021   Coronary CTA 11/27/2021: Moderate to large right pleural effusion and compressive atelectasis right lung base. => Coronary Calcium Score 657.  Diffuse RCA calcification.  Minimal mild disease in the LAD and diagonal branches. == Overall limited study.  Notable artifact.   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 He has disease progression after the first line of chemotherapy in December 2021.   Testosterone deficiency    Type 2 diabetes mellitus ([place])    Past Surgical History:  Procedure Laterality Date   CHEST TUBE INSERTION Right 01/20/2022   Procedure: CHEST TUBE INSERTION;  Surgeon: Candee Furbish, MD;  Location: [facility] ENDOSCOPY;  Service: Pulmonary;  Laterality: Right;  w/ Talc  Pleurodesis, planned admit for Obs afterwards   CHEST TUBE INSERTION Right 04/28/2022   Procedure: INSERTION PLEURAL DRAINAGE CATHETER - Pigtail tail, drainage;  Surgeon: Candee Furbish, MD;  Location: [facility] ENDOSCOPY;  Service: Pulmonary;  Laterality: Right;  talc pleurodesis   CHEST TUBE INSERTION Right 09/01/2022   Procedure: INSERTION PLEURAL DRAINAGE CATHETER;  Surgeon: Juanito Doom, MD;  Location: [place];  Service: Cardiopulmonary;  Laterality: Right;  pigtail catheter placement with talc pleurodesis   COLONOSCOPY WITH PROPOFOL N/A 12/17/2018   Procedure: COLONOSCOPY WITH PROPOFOL;  Surgeon: Virgel Manifold, MD;  Location: [facility] ENDOSCOPY;  Service: Gastroenterology;  Laterality: N/A;   IR IMAGING GUIDED PORT INSERTION  04/17/2020   IR THORACENTESIS ASP PLEURAL SPACE W/IMG GUIDE  11/29/2021   IR THORACENTESIS ASP PLEURAL SPACE W/IMG GUIDE  01/03/2022   JOINT REPLACEMENT Bilateral    REPLACEMENT TOTAL KNEE BILATERAL  2015   TALC PLEURODESIS  09/01/2022   Procedure: Pietro Cassis;  Surgeon: Juanito Doom, MD;  Location: [facility] ENDOSCOPY;  Service: Cardiopulmonary;;   [place] ECHOCARDIOGRAM  05/2020   a) 05/2020: EF 55 to 60%.  No R WMA.  Mild LVH.  Indeterminate LVEDP.  Unable to assess RVP.  Normal aortic and mitral valves.  Mildly elevated RAP.; b) 09/2021: EF 50-55%. No RWMA. Mild LVH. ~ LVEDP. Mild LA Dilation. NORMAL RV/RAP.  Normal MV/AoV.   Patient  Active Problem List   Diagnosis Date Noted   Hip pain 12/09/2022   Dizziness 11/12/2022   Chronic respiratory failure with hypoxia (HCC) 09/18/2022   Pneumothorax 09/02/2022   Pneumothorax on right 09/01/2022   Recurrent pleural effusion 08/27/2022   Recurrent pleural effusion on right 01/16/2022   Preop cardiovascular exam 12/19/2021   Coronary Calcium Score 657 11/13/2021   Atypical chest pain 11/13/2021   Pleural effusion 10/29/2021   Acute on chronic respiratory failure with hypoxia  ([place]) 10/12/2021   Recurrent right pleural effusion 08/08/2021   Erectile dysfunction 10/26/2020   Port-A-Cath in place 06/11/2020   History of pulmonary embolism 05/15/2020   Small cell lung cancer, right upper lobe ([place]) 04/12/2020   Chronic fatigue 04/12/2020   Encounter for antineoplastic chemotherapy 04/12/2020   Encounter for antineoplastic immunotherapy 04/12/2020   Malignant neoplasm of upper lobe of right lung ([place]) 04/04/2020   Goals of care, counseling/discussion 03/30/2020   Exertional dyspnea 03/26/2020   Hyperlipidemia associated with type 2 diabetes mellitus ([place]) 11/08/2019   Joint swelling 11/16/2018   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   Essential hypertension 09/09/2017   Type 2 diabetes mellitus ([place]) 09/09/2017   GERD (gastroesophageal reflux disease) 09/09/2017   Testosterone deficiency 09/09/2017   OSA (obstructive sleep apnea) 09/09/2017   Chronic bilateral low back pain without sciatica 09/09/2017   H/O knee surgery 04/27/2014   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    PERTINENT HISTORY:  Patient is a very pleasant 72 year old male presenting to physical therapy for chronic low back pain without sciatica. Patient has been seen by this therapist in the past. Patient additionally has relapsed extensive stage small cell lung cancer and  now multiple metastatic liver lesions diagnosed in April 2021 with history of radiotherapy and chemotherapy. PMH includes chickenpox COPD, CAD, HTN, GERD, OSA, type II DM, bilateral TKA. Patient tried figure four and stomach pressing up. Also having vertigo.   PAIN:  Are you having pain? 7/10 pain Yes: NPRS scale: worst  pain: 10/10; least amount of pain 4/10 current pain 6-7/10 Pain location: R sided low back pain radiating to back of knee; sometimes feeling like wet pants of adduction. Pain description: radiating to back pain Aggravating factors: getting up,  Relieving factors: laying down in bed, bending over the sink  PRECAUTIONS: Other: back, cancer   WEIGHT BEARING RESTRICTIONS: No  FALLS:  Has patient fallen in last 6 months? No  LIVING ENVIRONMENT: Lives with: lives with their spouse Lives in: House/apartment Stairs: Yes: Internal: flight steps; on right going up and on left going up and External: 5 steps; on right going up and on left going up Has following equipment at home: Single point cane, Environmental consultant - 4 wheeled, Electronics engineer, and Grab bars  OCCUPATION: retired IT trainer  PLOF: Independent  PATIENT GOALS: to have less pain  NEXT MD VISIT: n/a  OBJECTIVE:   DIAGNOSTIC FINDINGS:  MR lumbar 2020 Advanced disc degeneration T12-L1 with mild spinal stenosis   Mild spinal stenosis L2-3   Moderate spinal stenosis L3-4 and L4-5  PATIENT SURVEYS:  FOTO 36  SCREENING FOR RED FLAGS: Bowel or bladder incontinence: No Spinal tumors: No Cauda equina syndrome: No Compression fracture: No Abdominal aneurysm: No  COGNITION: Overall cognitive status: Within functional limits for tasks assessed     SENSATION: Hot/Cold: Impaired R medial knee  MUSCLE LENGTH: Hamstrings: Right 25 deg; Left 40 deg   POSTURE: rounded shoulders, increased thoracic kyphosis, posterior pelvic tilt, and flexed trunk   PALPATION: Excessive muscle guarding along lumbar paraspinals and  piriformis   LUMBAR ROM:   AROM eval  Flexion limited  Extension limited  Right lateral flexion limited  Left lateral flexion limited  Right rotation limited  Left rotation limited   (Blank rows = not tested)    LOWER EXTREMITY MMT:    MMT Right eval Left eval  Hip flexion 2+* 3  Hip extension    Hip abduction 3* 3  Hip adduction 3-* 3  Knee flexion 3-* 3  Knee extension 3 3  Ankle dorsiflexion 4 4  Ankle plantarflexion 4 4   (Blank rows = not tested)  LUMBAR SPECIAL TESTS:  Straight leg raise test: Positive, SI Compression/distraction test: Positive, FABER test: Negative, and scour negative; distraction positive   FUNCTIONAL TESTS:  5 times sit to stand: unable to perform due to pain 10 meter walk test: unable to perform due to pain Sit to stand: CGA due to extreme pain Supine,<>prone; slow, extra time due to pain  GAIT: Distance walked: 3 ft Assistive device utilized: Single point cane Level of assistance: CGA Comments: severe pain with walking  TODAY'S TREATMENT:                                                                                                                              DATE: 03/02/23   On large mat table:  TherEx:  Hooklying lower trunk rotation, 2x10 Sciatic nerve glide 20x each LE  Adduction ball squeeze 10x   Seated  Adduction ball squeeze 10x Forward/backward trunk relaxation technique Glute squeeze 10x   Manual: Hamstring lengthening stretch 60 seconds each LE Figure four stretch 60 seconds Single knee to chest 60 seconds    Ultrasound:1.5 cm squared with 3 MHzx 7 minutes to L glenohumeral joint.     PATIENT EDUCATION:  Education details: POC, anterior pelvic stretch Person educated: Patient and Spouse Education method: Explanation, Demonstration, Tactile cues, and Verbal cues Education comprehension: verbalized understanding, returned demonstration, verbal cues required, and tactile cues required  HOME EXERCISE  PROGRAM: Anterior pelvic tilt 10x Hamstring stretch 30 seconds Lay prone 60 seconds   Access Code: [email or web address] URL: [email or web address] Date: 01/29/2023 Prepared by: Janna Arch  Exercises - Seated Anterior Pelvic Tilt  - 1 x daily - 7 x weekly - 2 sets - 10 reps - 5 hold - Supine Anterior Pelvic Tilt  - 1 x daily - 7 x weekly - 2 sets - 10 reps - 5 hold - Seated Hamstring Stretch  - 1 x daily - 7 x weekly - 2 sets - 2 reps - 30 hold - Supine Transversus Abdominis Bracing - Hands on Ground  - 1 x daily - 7 x weekly - 2 sets - 10 reps - 5 hold  ASSESSMENT:  CLINICAL IMPRESSION: Patient agreeable to meet with ortho due to ongoing back pain. Patient agreeable to plan. Patient does have significant pain reduction by end of session from 8/10 to 3/10 pain. Pt will continue to benefit from skilled therapy to address remaining deficits in order to improve overall QoL and return to PLOF.      OBJECTIVE IMPAIRMENTS: Abnormal gait, cardiopulmonary status limiting activity, decreased activity tolerance, decreased balance, decreased endurance, decreased mobility, difficulty walking, decreased ROM, decreased strength, hypomobility, impaired perceived functional ability, increased muscle spasms, impaired flexibility, impaired sensation, improper body mechanics, postural dysfunction, obesity, and pain.   ACTIVITY LIMITATIONS: carrying, lifting, bending, sitting, standing, squatting, sleeping, stairs, transfers, bed mobility, bathing, toileting, dressing, reach over head, hygiene/grooming, and locomotion level  PARTICIPATION LIMITATIONS: meal prep, cleaning, laundry, medication management, personal finances, interpersonal relationship, driving, shopping, community activity, occupation, yard work, and church  PERSONAL FACTORS: Age, Behavior pattern, Education, Restaurant manager, fast food, Past/current experiences, Profession, Sex, Social background, Time since onset of injury/illness/exacerbation, and 3+  comorbidities: chickenpox COPD, CAD, HTN, GERD, OSA, type II DM, bilateral TKA  are also affecting patient's functional outcome.   REHAB POTENTIAL: Good  CLINICAL DECISION MAKING: Evolving/moderate complexity  EVALUATION COMPLEXITY: Moderate   GOALS: Goals reviewed with patient? Yes  SHORT TERM GOALS: Target date: 02/23/2023  Patient will be independent in home exercise program to improve strength/mobility for better functional independence with ADLs. Baseline: 2/12: given to patient Goal status: INITIAL  2.  .  Patient will report a worst pain of  5/10 on VAS in low back to improve tolerance with ADLs and reduced symptoms with activities. Baseline: 2/12: 10/10 Goal status: INITIAL    LONG TERM GOALS: Target date: 03/23/2023  Patient will increase FOTO score to equal to or greater than 48%    to demonstrate statistically significant improvement in mobility and quality of life.  Baseline: 2/12: 36% Goal status: INITIAL  2.  Patient will report a worst pain of 3/10 on VAS in low back to improve tolerance with ADLs and reduced symptoms with activities.  Baseline: 2/12: 10/10 Goal status: INITIAL  3.  Patient will increase 10 meter walk test to >1.12m/s as to improve gait speed for  better community ambulation and to reduce fall risk Baseline: 2/12: unable to ambulate 10 meters Goal status: INITIAL  4.  Patient (> 65 years old) will complete five times sit to stand test in < 15 seconds indicating an increased LE strength and improved balance. Baseline: 2/12: unable to perform due to severe pain Goal status: INITIAL    PLAN:  PT FREQUENCY: 2x/week  PT DURATION: 8 weeks  PLANNED INTERVENTIONS: Therapeutic exercises, Therapeutic activity, Neuromuscular re-education, Balance training, Gait training, Patient/Family education, Self Care, Joint mobilization, Stair training, Vestibular training, Canalith repositioning, Orthotic/Fit training, DME instructions, Electrical stimulation,  Spinal manipulation, Spinal mobilization, Cryotherapy, Moist heat, Splintting, Taping, Vasopneumatic device, Traction, Ultrasound, Manual therapy, and Re-evaluation.  PLAN FOR NEXT SESSION:   anterior tilting, reducing muscle tension, core and LE strengthening.   Janna Arch PT  Physical Therapist - [facility]  03/02/23, 12:42 PM

## 2023-03-02 ENCOUNTER — Ambulatory Visit: Payer: Medicare Other

## 2023-03-02 DIAGNOSIS — M6281 Muscle weakness (generalized): Secondary | ICD-10-CM

## 2023-03-02 DIAGNOSIS — R262 Difficulty in walking, not elsewhere classified: Secondary | ICD-10-CM | POA: Diagnosis not present

## 2023-03-02 DIAGNOSIS — M5459 Other low back pain: Secondary | ICD-10-CM

## 2023-03-04 ENCOUNTER — Telehealth: Payer: Self-pay | Admitting: Medical Oncology

## 2023-03-04 ENCOUNTER — Inpatient Hospital Stay: Payer: Medicare Other

## 2023-03-04 ENCOUNTER — Other Ambulatory Visit: Payer: Self-pay | Admitting: Medical Oncology

## 2023-03-04 ENCOUNTER — Other Ambulatory Visit: Payer: Self-pay | Admitting: Internal Medicine

## 2023-03-04 VITALS — BP 115/74 | HR 88 | Temp 98.5°F | Resp 20

## 2023-03-04 DIAGNOSIS — Z95828 Presence of other vascular implants and grafts: Secondary | ICD-10-CM

## 2023-03-04 DIAGNOSIS — C3411 Malignant neoplasm of upper lobe, right bronchus or lung: Secondary | ICD-10-CM

## 2023-03-04 DIAGNOSIS — D61810 Antineoplastic chemotherapy induced pancytopenia: Secondary | ICD-10-CM | POA: Diagnosis not present

## 2023-03-04 DIAGNOSIS — Z5189 Encounter for other specified aftercare: Secondary | ICD-10-CM | POA: Diagnosis not present

## 2023-03-04 DIAGNOSIS — D6481 Anemia due to antineoplastic chemotherapy: Secondary | ICD-10-CM

## 2023-03-04 DIAGNOSIS — Z5111 Encounter for antineoplastic chemotherapy: Secondary | ICD-10-CM | POA: Diagnosis not present

## 2023-03-04 DIAGNOSIS — Z452 Encounter for adjustment and management of vascular access device: Secondary | ICD-10-CM | POA: Diagnosis not present

## 2023-03-04 DIAGNOSIS — C787 Secondary malignant neoplasm of liver and intrahepatic bile duct: Secondary | ICD-10-CM | POA: Diagnosis not present

## 2023-03-04 LAB — CBC WITH DIFFERENTIAL (CANCER CENTER ONLY)
Abs Immature Granulocytes: 0.02 10*3/uL (ref 0.00–0.07)
Basophils Absolute: 0 10*3/uL (ref 0.0–0.1)
Basophils Relative: 2 %
Eosinophils Absolute: 0 10*3/uL (ref 0.0–0.5)
Eosinophils Relative: 2 %
HCT: 23.8 % — ABNORMAL LOW (ref 39.0–52.0)
Hemoglobin: 7.9 g/dL — ABNORMAL LOW (ref 13.0–17.0)
Immature Granulocytes: 2 %
Lymphocytes Relative: 24 %
Lymphs Abs: 0.2 10*3/uL — ABNORMAL LOW (ref 0.7–4.0)
MCH: 32.5 pg (ref 26.0–34.0)
MCHC: 33.2 g/dL (ref 30.0–36.0)
MCV: 97.9 fL (ref 80.0–100.0)
Monocytes Absolute: 0.3 10*3/uL (ref 0.1–1.0)
Monocytes Relative: 31 %
Neutro Abs: 0.3 10*3/uL — CL (ref 1.7–7.7)
Neutrophils Relative %: 39 %
Platelet Count: 43 10*3/uL — ABNORMAL LOW (ref 150–400)
RBC: 2.43 MIL/uL — ABNORMAL LOW (ref 4.22–5.81)
RDW: 18.2 % — ABNORMAL HIGH (ref 11.5–15.5)
Smear Review: NORMAL
WBC Count: 0.9 10*3/uL — CL (ref 4.0–10.5)
nRBC: 0 % (ref 0.0–0.2)

## 2023-03-04 LAB — SAMPLE TO BLOOD BANK

## 2023-03-04 MED ORDER — SODIUM CHLORIDE 0.9% FLUSH
10.0000 mL | Freq: Once | INTRAVENOUS | Status: AC
  Administered 2023-03-04: 10 mL

## 2023-03-04 MED ORDER — HEPARIN SOD (PORK) LOCK FLUSH 100 UNIT/ML IV SOLN
500.0000 [IU] | Freq: Once | INTRAVENOUS | Status: AC
  Administered 2023-03-04: 500 [IU]

## 2023-03-04 MED ORDER — FILGRASTIM-AAFI 480 MCG/0.8ML IJ SOSY
480.0000 ug | PREFILLED_SYRINGE | Freq: Once | INTRAMUSCULAR | Status: AC
  Administered 2023-03-04: 480 ug via SUBCUTANEOUS
  Filled 2023-03-04: qty 0.8

## 2023-03-04 NOTE — Progress Notes (Signed)
The following biosimilar Nivestym ([id number]) has been selected for use in this patient.  Kennith Center, Pharm.D., CPP 03/04/2023@1 :17 PM

## 2023-03-04 NOTE — Telephone Encounter (Signed)
Pt scheduled for GCSF injections this week.

## 2023-03-04 NOTE — Addendum Note (Signed)
Addended by: Tora Kindred on: 03/04/2023 01:17 PM   Modules accepted: Orders

## 2023-03-04 NOTE — Patient Instructions (Signed)
Filgrastim Injection What is this medication? FILGRASTIM (fil GRA stim) lowers the risk of infection in people who are receiving chemotherapy. It works by helping your body make more white blood cells, which protects your body from infection. It may also be used to help people who have been exposed to high doses of radiation. It can be used to help prepare your body before a stem cell transplant. It works by helping your bone marrow make and release stem cells into the blood. This medicine may be used for other purposes; ask your health care provider or pharmacist if you have questions. COMMON BRAND NAME(S): Neupogen, Nivestym, Releuko, Zarxio What should I tell my care team before I take this medication? They need to know if you have any of these conditions: History of blood diseases, such as sickle cell anemia Kidney disease Recent or ongoing radiation An unusual or allergic reaction to filgrastim, pegfilgrastim, latex, rubber, other medications, foods, dyes, or preservatives Pregnant or trying to get pregnant Breast-feeding How should I use this medication? This medication is injected under the skin or into a vein. It is usually given by your care team in a hospital or clinic setting. It may be given at home. If you get this medication at home, you will be taught how to prepare and give it. Use exactly as directed. Take it as directed on the prescription label at the same time every day. Keep taking it unless your care team tells you to stop. It is important that you put your used needles and syringes in a special sharps container. Do not put them in a trash can. If you do not have a sharps container, call your pharmacist or care team to get one. This medication comes with INSTRUCTIONS FOR USE. Ask your pharmacist for directions on how to use this medication. Read the information carefully. Talk to your pharmacist or care team if you have questions. Talk to your care team about the use of this  medication in children. While it may be prescribed for children for selected conditions, precautions do apply. Overdosage: If you think you have taken too much of this medicine contact a poison control center or emergency room at once. NOTE: This medicine is only for you. Do not share this medicine with others. What if I miss a dose? It is important not to miss any doses. Talk to your care team about what to do if you miss a dose. What may interact with this medication? Medications that may cause a release of neutrophils, such as lithium This list may not describe all possible interactions. Give your health care provider a list of all the medicines, herbs, non-prescription drugs, or dietary supplements you use. Also tell them if you smoke, drink alcohol, or use illegal drugs. Some items may interact with your medicine. What should I watch for while using this medication? Your condition will be monitored carefully while you are receiving this medication. You may need bloodwork while taking this medication. Talk to your care team about your risk of cancer. You may be more at risk for certain types of cancer if you take this medication. What side effects may I notice from receiving this medication? Side effects that you should report to your care team as soon as possible: Allergic reactions--skin rash, itching, hives, swelling of the face, lips, tongue, or throat Capillary leak syndrome--stomach or muscle pain, unusual weakness or fatigue, feeling faint or lightheaded, decrease in the amount of urine, swelling of the ankles, hands, or   feet, trouble breathing High white blood cell level--fever, fatigue, trouble breathing, night sweats, change in vision, weight loss Inflammation of the aorta--fever, fatigue, back, chest, or stomach pain, severe headache Kidney injury (glomerulonephritis)--decrease in the amount of urine, red or dark brown urine, foamy or bubbly urine, swelling of the ankles, hands, or  feet Shortness of breath or trouble breathing Spleen injury--pain in upper left stomach or shoulder Unusual bruising or bleeding Side effects that usually do not require medical attention (report to your care team if they continue or are bothersome): Back pain Bone pain Fatigue Fever Headache Nausea This list may not describe all possible side effects. Call your doctor for medical advice about side effects. You may report side effects to FDA at 1-800-FDA-1088. Where should I keep my medication? Keep out of the reach of children and pets. Keep this medication in the original packaging until you are ready to take it. Protect from light. See product for storage information. Each product may have different instructions. Get rid of any unused medication after the expiration date. To get rid of medications that are no longer needed or have expired: Take the medication to a medications take-back program. Check with your pharmacy or law enforcement to find a location. If you cannot return the medication, ask your pharmacist or care team how to get rid of this medication safely. NOTE: This sheet is a summary. It may not cover all possible information. If you have questions about this medicine, talk to your doctor, pharmacist, or health care provider.  2023 Elsevier/Gold Standard (2022-03-11 00:00:00)  

## 2023-03-05 ENCOUNTER — Inpatient Hospital Stay: Payer: Medicare Other

## 2023-03-05 ENCOUNTER — Other Ambulatory Visit: Payer: Self-pay

## 2023-03-05 ENCOUNTER — Ambulatory Visit: Payer: Medicare Other

## 2023-03-05 VITALS — BP 114/73 | HR 99 | Temp 99.7°F | Resp 20

## 2023-03-05 DIAGNOSIS — C3411 Malignant neoplasm of upper lobe, right bronchus or lung: Secondary | ICD-10-CM

## 2023-03-05 DIAGNOSIS — M5459 Other low back pain: Secondary | ICD-10-CM | POA: Diagnosis not present

## 2023-03-05 DIAGNOSIS — R262 Difficulty in walking, not elsewhere classified: Secondary | ICD-10-CM | POA: Diagnosis not present

## 2023-03-05 DIAGNOSIS — Z95828 Presence of other vascular implants and grafts: Secondary | ICD-10-CM

## 2023-03-05 DIAGNOSIS — D61810 Antineoplastic chemotherapy induced pancytopenia: Secondary | ICD-10-CM | POA: Diagnosis not present

## 2023-03-05 DIAGNOSIS — M6281 Muscle weakness (generalized): Secondary | ICD-10-CM

## 2023-03-05 DIAGNOSIS — C787 Secondary malignant neoplasm of liver and intrahepatic bile duct: Secondary | ICD-10-CM | POA: Diagnosis not present

## 2023-03-05 DIAGNOSIS — Z5111 Encounter for antineoplastic chemotherapy: Secondary | ICD-10-CM | POA: Diagnosis not present

## 2023-03-05 DIAGNOSIS — Z452 Encounter for adjustment and management of vascular access device: Secondary | ICD-10-CM | POA: Diagnosis not present

## 2023-03-05 DIAGNOSIS — Z5189 Encounter for other specified aftercare: Secondary | ICD-10-CM | POA: Diagnosis not present

## 2023-03-05 MED ORDER — FILGRASTIM-AAFI 480 MCG/0.8ML IJ SOSY
480.0000 ug | PREFILLED_SYRINGE | Freq: Once | INTRAMUSCULAR | Status: AC
  Administered 2023-03-05: 480 ug via SUBCUTANEOUS
  Filled 2023-03-05: qty 0.8

## 2023-03-05 NOTE — Therapy (Signed)
OUTPATIENT PHYSICAL THERAPY THORACOLUMBAR TREATMENT   Patient Name: Adam Wise MRN: [id number] DOB:12-Dec-1951, 72 y.o., male Today's Date: 03/05/2023  END OF SESSION:  PT End of Session - 03/05/23 1309     Visit Number 9    Number of Visits 16    Date for PT Re-Evaluation 03/23/23    Authorization Type 8/10 eval 01/26/23    PT Start Time 1102    PT Stop Time 1147    PT Time Calculation (min) 45 min    Activity Tolerance Patient tolerated treatment well;No increased pain    Behavior During Therapy [facility] for tasks assessed/performed                    Past Medical History:  Diagnosis Date   Chickenpox    Chronic knee pain    Chronic low back pain    COPD exacerbation ([place]) 10/11/2021   COPD with exacerbation ([place]) 10/12/2021   Coronary artery calcification seen on CAT scan 11/2021   Coronary CTA 11/27/2021: Moderate to large right pleural effusion and compressive atelectasis right lung base. => Coronary Calcium Score 657.  Diffuse RCA calcification.  Minimal mild disease in the LAD and diagonal branches. == Overall limited study.  Notable artifact.   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 He has disease progression after the first line of chemotherapy in December 2021.   Testosterone deficiency    Type 2 diabetes mellitus ([place])    Past Surgical History:  Procedure Laterality Date   CHEST TUBE INSERTION Right 01/20/2022   Procedure: CHEST TUBE INSERTION;  Surgeon: Candee Furbish, MD;  Location: [facility] ENDOSCOPY;  Service: Pulmonary;  Laterality: Right;  w/ Talc  Pleurodesis, planned admit for Obs afterwards   CHEST TUBE INSERTION Right 04/28/2022   Procedure: INSERTION PLEURAL DRAINAGE CATHETER - Pigtail tail, drainage;  Surgeon: Candee Furbish, MD;  Location: [facility] ENDOSCOPY;  Service: Pulmonary;  Laterality: Right;  talc pleurodesis   CHEST TUBE INSERTION Right 09/01/2022   Procedure: INSERTION PLEURAL DRAINAGE CATHETER;  Surgeon: Juanito Doom, MD;  Location: [place];  Service: Cardiopulmonary;  Laterality: Right;  pigtail catheter placement with talc pleurodesis   COLONOSCOPY WITH PROPOFOL N/A 12/17/2018   Procedure: COLONOSCOPY WITH PROPOFOL;  Surgeon: Virgel Manifold, MD;  Location: [facility] ENDOSCOPY;  Service: Gastroenterology;  Laterality: N/A;   IR IMAGING GUIDED PORT INSERTION  04/17/2020   IR THORACENTESIS ASP PLEURAL SPACE W/IMG GUIDE  11/29/2021   IR THORACENTESIS ASP PLEURAL SPACE W/IMG GUIDE  01/03/2022   JOINT REPLACEMENT Bilateral    REPLACEMENT TOTAL KNEE BILATERAL  2015   TALC PLEURODESIS  09/01/2022   Procedure: Pietro Cassis;  Surgeon: Juanito Doom, MD;  Location[facility] ENDOSCOPY;  Service: Cardiopulmonary;;   [place] ECHOCARDIOGRAM  05/2020   a) 05/2020: EF 55 to 60%.  No R WMA.  Mild LVH.  Indeterminate LVEDP.  Unable to assess RVP.  Normal aortic and mitral valves.  Mildly elevated RAP.; b) 09/2021: EF 50-55%. No RWMA. Mild LVH. ~ LVEDP. Mild LA Dilation. NORMAL RV/RAP.  Normal MV/AoV.  Patient Active Problem List   Diagnosis Date Noted   Hip pain 12/09/2022   Dizziness 11/12/2022   Chronic respiratory failure with hypoxia (HCC) 09/18/2022   Pneumothorax 09/02/2022   Pneumothorax on right 09/01/2022   Recurrent pleural effusion 08/27/2022   Recurrent pleural effusion on right 01/16/2022   Preop cardiovascular exam 12/19/2021   Coronary Calcium Score 657 11/13/2021   Atypical chest pain 11/13/2021   Pleural effusion 10/29/2021   Acute on chronic respiratory failure with hypoxia  ([place]) 10/12/2021   Recurrent right pleural effusion 08/08/2021   Erectile dysfunction 10/26/2020   Port-A-Cath in place 06/11/2020   History of pulmonary embolism 05/15/2020   Small cell lung cancer, right upper lobe ([place]) 04/12/2020   Chronic fatigue 04/12/2020   Encounter for antineoplastic chemotherapy 04/12/2020   Encounter for antineoplastic immunotherapy 04/12/2020   Malignant neoplasm of upper lobe of right lung ([place]) 04/04/2020   Goals of care, counseling/discussion 03/30/2020   Exertional dyspnea 03/26/2020   Hyperlipidemia associated with type 2 diabetes mellitus ([place]) 11/08/2019   Joint swelling 11/16/2018   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   Essential hypertension 09/09/2017   Type 2 diabetes mellitus ([place]) 09/09/2017   GERD (gastroesophageal reflux disease) 09/09/2017   Testosterone deficiency 09/09/2017   OSA (obstructive sleep apnea) 09/09/2017   Chronic bilateral low back pain without sciatica 09/09/2017   H/O knee surgery 04/27/2014   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    PERTINENT HISTORY:  Patient is a very pleasant 72 year old male presenting to physical therapy for chronic low back pain without sciatica. Patient has been seen by this therapist in the past. Patient additionally has relapsed extensive stage small cell lung cancer and  now multiple metastatic liver lesions diagnosed in April 2021 with history of radiotherapy and chemotherapy. PMH includes chickenpox COPD, CAD, HTN, GERD, OSA, type II DM, bilateral TKA. Patient tried figure four and stomach pressing up. Also having vertigo.   PAIN:  Are you having pain? 7/10 pain Yes: NPRS scale:  worst pain: 10/10; least amount of pain 4/10 current pain 6-7/10 Pain location: R sided low back pain radiating to back of knee; sometimes feeling like wet pants of adduction. Pain description: radiating to back pain Aggravating factors: getting up,  Relieving factors: laying down in bed, bending over the sink  PRECAUTIONS: Other: back, cancer   WEIGHT BEARING RESTRICTIONS: No  FALLS:  Has patient fallen in last 6 months? No  LIVING ENVIRONMENT: Lives with: lives with their spouse Lives in: House/apartment Stairs: Yes: Internal: flight steps; on right going up and on left going up and External: 5 steps; on right going up and on left going up Has following equipment at home: Single point cane, Environmental consultant - 4 wheeled, Electronics engineer, and Grab bars  OCCUPATION: retired IT trainer  PLOF: Independent  PATIENT GOALS: to have less pain  NEXT MD VISIT: n/a  OBJECTIVE:   DIAGNOSTIC FINDINGS:  MR lumbar 2020 Advanced disc degeneration T12-L1 with mild spinal stenosis   Mild spinal stenosis L2-3   Moderate spinal stenosis L3-4 and L4-5  PATIENT SURVEYS:  FOTO 36  SCREENING FOR RED FLAGS: Bowel or bladder incontinence: No Spinal tumors: No Cauda equina syndrome: No Compression fracture: No Abdominal aneurysm: No  COGNITION: Overall cognitive status: Within functional limits for tasks assessed     SENSATION: Hot/Cold: Impaired R medial knee  MUSCLE LENGTH: Hamstrings: Right 25 deg; Left 40 deg   POSTURE: rounded shoulders, increased thoracic kyphosis, posterior pelvic tilt, and flexed trunk   PALPATION: Excessive muscle guarding along lumbar paraspinals and  piriformis   LUMBAR ROM:   AROM eval  Flexion limited  Extension limited  Right lateral flexion limited  Left lateral flexion limited  Right rotation limited  Left rotation limited   (Blank rows = not tested)    LOWER EXTREMITY MMT:    MMT Right eval Left eval  Hip flexion 2+* 3  Hip extension    Hip abduction 3* 3  Hip adduction 3-* 3  Knee flexion 3-* 3  Knee extension 3 3  Ankle dorsiflexion 4 4  Ankle plantarflexion 4 4   (Blank rows = not tested)  LUMBAR SPECIAL TESTS:  Straight leg raise test: Positive, SI Compression/distraction test: Positive, FABER test: Negative, and scour negative; distraction positive   FUNCTIONAL TESTS:  5 times sit to stand: unable to perform due to pain 10 meter walk test: unable to perform due to pain Sit to stand: CGA due to extreme pain Supine,<>prone; slow, extra time due to pain  GAIT: Distance walked: 3 ft Assistive device utilized: Single point cane Level of assistance: CGA Comments: severe pain with walking  TODAY'S TREATMENT:                                                                                                                              DATE: 03/05/23   On large mat table:  Manual: Long axis RLE distraction with min Hip IR- Leg straight- hold  60 sec x 3 Distraction in hooklye with strap across proximal thigh- inferior distraction- 30 bouts x 2 Hamstring lengthening stretch 60 seconds x 2 right LE Figure four stretch 60 seconds hold on right x 3 Single knee to chest 60 seconds right LE x 3 Lower trunk rotation hold 30 sec x 3 each LE Sciatic nerve glide 20x each LE  Modalities:  Ultrasound:1.5 cm squared with 3 MHzx 8 minutes to right lateral hip (TFL down toward greater trochanter region)     PATIENT EDUCATION:  Education details: POC, anterior pelvic stretch Person educated: Patient and Spouse Education method: Explanation, Demonstration, Tactile cues, and Verbal cues Education comprehension:  verbalized understanding, returned demonstration, verbal cues required, and tactile cues required  HOME EXERCISE PROGRAM: Anterior pelvic tilt 10x Hamstring stretch 30 seconds Lay prone 60 seconds   Access Code: [email or web address] URL: [email or web address] Date: 01/29/2023 Prepared by: Janna Arch  Exercises - Seated Anterior Pelvic Tilt  - 1 x daily - 7 x weekly - 2 sets - 10 reps - 5 hold - Supine Anterior Pelvic Tilt  - 1 x daily - 7 x weekly - 2 sets - 10 reps - 5 hold - Seated Hamstring Stretch  - 1 x daily - 7 x weekly - 2 sets - 2 reps - 30 hold - Supine Transversus Abdominis Bracing - Hands on Ground  - 1 x daily - 7 x weekly - 2 sets - 10 reps - 5 hold  ASSESSMENT:  CLINICAL IMPRESSION: Patient reported that today would be his last visit until he sees the orthopedic secondary to ongoing pain with minimal to no long term relief. He was tight initially today with stretching but did present with improved flexibility at end of stretching and again reported some relief with use of ultrasound to right lateral hip today. He was able to transfer from EOM back to transport chair without any significant difficulty. Patient to see Ortho MD next week and will discuss visit and follow any MD recommendations.  Pt will continue to benefit from skilled therapy to address remaining deficits in order to improve overall QoL and return to PLOF.      OBJECTIVE IMPAIRMENTS: Abnormal gait, cardiopulmonary status limiting activity, decreased activity tolerance, decreased balance, decreased endurance, decreased mobility, difficulty walking, decreased ROM, decreased strength, hypomobility, impaired perceived functional ability, increased muscle spasms, impaired flexibility, impaired sensation, improper body mechanics, postural dysfunction, obesity, and pain.   ACTIVITY LIMITATIONS: carrying, lifting, bending, sitting, standing, squatting, sleeping, stairs, transfers, bed mobility, bathing, toileting,  dressing, reach over head, hygiene/grooming, and locomotion level  PARTICIPATION LIMITATIONS: meal prep, cleaning, laundry, medication management, personal finances, interpersonal relationship, driving, shopping, community activity, occupation, yard work, and church  PERSONAL FACTORS: Age, Behavior pattern, Education, Restaurant manager, fast food, Past/current experiences, Profession, Sex, Social background, Time since onset of injury/illness/exacerbation, and 3+ comorbidities: chickenpox COPD, CAD, HTN, GERD, OSA, type II DM, bilateral TKA  are also affecting patient's functional outcome.   REHAB POTENTIAL: Good  CLINICAL DECISION MAKING: Evolving/moderate complexity  EVALUATION COMPLEXITY: Moderate   GOALS: Goals reviewed with patient? Yes  SHORT TERM GOALS: Target date: 02/23/2023  Patient will be independent in home exercise program to improve strength/mobility for better functional independence with ADLs. Baseline: 2/12: given to patient Goal status: INITIAL  2.  .  Patient will report a worst pain of  5/10 on VAS in low back to improve tolerance with ADLs and reduced symptoms with activities. Baseline: 2/12: 10/10 Goal status: INITIAL    LONG TERM GOALS: Target  date: 03/23/2023  Patient will increase FOTO score to equal to or greater than 48%    to demonstrate statistically significant improvement in mobility and quality of life.  Baseline: 2/12: 36% Goal status: INITIAL  2.  Patient will report a worst pain of 3/10 on VAS in low back to improve tolerance with ADLs and reduced symptoms with activities.  Baseline: 2/12: 10/10 Goal status: INITIAL  3.  Patient will increase 10 meter walk test to >1.47m/s as to improve gait speed for better community ambulation and to reduce fall risk Baseline: 2/12: unable to ambulate 10 meters Goal status: INITIAL  4.  Patient (> 16 years old) will complete five times sit to stand test in < 15 seconds indicating an increased LE strength and improved  balance. Baseline: 2/12: unable to perform due to severe pain Goal status: INITIAL    PLAN:  PT FREQUENCY: 2x/week  PT DURATION: 8 weeks  PLANNED INTERVENTIONS: Therapeutic exercises, Therapeutic activity, Neuromuscular re-education, Balance training, Gait training, Patient/Family education, Self Care, Joint mobilization, Stair training, Vestibular training, Canalith repositioning, Orthotic/Fit training, DME instructions, Electrical stimulation, Spinal manipulation, Spinal mobilization, Cryotherapy, Moist heat, Splintting, Taping, Vasopneumatic device, Traction, Ultrasound, Manual therapy, and Re-evaluation.  PLAN FOR NEXT SESSION:  Plan for progress note visit #10; Discuss ortho visit (as patient has cancelled PT until seen by ortho); continue with manual technique and progressive ROM; modalities prn for pain relief.  Lewis Moccasin PT  Physical Therapist - [facility]  03/05/23, 1:10 PM

## 2023-03-05 NOTE — Progress Notes (Signed)
[facility] OFFICE PROGRESS NOTE  [name], NP [place] [address]  DIAGNOSIS: Relapsed extensive stage (T4, N3, M1c)  small cell lung cancer diagnosed in April 2021 and presented with large right upper lobe/right hilar mass with ipsilateral and contralateral mediastinal and right supraclavicular lymphadenopathy in addition to multiple liver lesions. He has disease progression after the first line of chemotherapy in December 2021.     PRIOR THERAPY: 1) Palliative radiotherapy to the right upper lobe lung mass under the care of Dr. Lisbeth Renshaw. 2) Systemic chemotherapy with carboplatin for AUC of 5 on day 1, etoposide 100 mg/M2 on days 1, 2 and 3 in addition to Imfinzi 1500 mg IV every 3 weeks with chemotherapy then every 4 weeks for maintenance if the patient has no evidence for progression.  He will also receive Cosela 240 mg/m2 on the days of the chemotherapy.  Status post 9 cycles.  Starting from cycle #5 the patient will be on maintenance treatment with immunotherapy with Imfinzi 1500 mg IV every 4 weeks. Last dose of chemotherapy was given on November 13, 2020. This treatment was discontinued secondary to disease progression 3) Systemic chemotherapy with carboplatin for AUC of 5 on day 1, etoposide 100 mg/M2 on days 1, 2 and 3, Tecentriq 1200 mg IV every 3 weeks as well as Cosela 250 mg/M2 on the days of the chemotherapy every 3 weeks.  First dose December 18, 2020.  Status post 8 cycles. 4) Zepzelca 3.2 mgm2 IV every 3 weeks. Last dose on 01/23/22. Status post 12 cycles 5) Palliative systemic chemotherapy with irinotecan 65 mg/m2 on days 1 and 8 IV every 3 weeks.  Status post 3 cycles.  Last dose was given April 01, 2022 discontinued secondary to disease progression 6) SBRT to the progressive liver lesions under the care of Dr. Lisbeth Renshaw.  Last fraction January 27, 2023   CURRENT THERAPY: Systemic chemotherapy with carboplatin for AUC of 5 on day 1, etoposide 100  Mg/M2 on days 1, 2 and 3 with Cosela before the chemotherapy.  First dose expected to start on 05/28/2022.  Status post 13 cycles.  Starting from cycle #5 his carboplatin will be reduced to AUC of 4 and 2 etoposide 80 Mg/M2.   INTERVAL HISTORY: Adam Wise 72 y.o. male returns to the clinic today for a follow-up visit accompanied by his wife.  The patient is feeling fairly well today without any concerning complaints except he is undergoing physical therapy for degenerative back changes. The patient is currently undergoing  dose reduced chemotherapy. He often requires supportive care due to the increased myelosuppression from the cumulative doses of chemo.  In the interval, he had significant neutropenia for which he required granix.  He also received blood transfusion 2 weeks ago.  He had thrombocytopenia on labs last week.   He denies fevers, chills, night sweats, or unexplained weight loss.  He reports intermittently poor appetite.  Denies any cough or hemoptysis.  He reports baseline dyspnea on exertion.  He denies any abnormal bleeding except he does bruise easier due to his eliquis.  He denies any signs and symptoms of infection including nasal congestion, sore throat, skin infections, or dysuria.  He denies any diarrhea but reports "softer" stools.  Denies any nausea, vomiting, or diarrhea.  He denies constipation. Denies any headache or visual changes. He is here today for evaluation and repeat blood work before starting cycle #14.   MEDICAL HISTORY: Past Medical History:  Diagnosis  Date   Chickenpox    Chronic knee pain    Chronic low back pain    COPD exacerbation ([facility]) 10/11/2021   COPD with exacerbation ([facility]) 10/12/2021   Coronary artery calcification seen on CAT scan 11/2021   Coronary CTA 11/27/2021: Moderate to large right pleural effusion and compressive atelectasis right lung base. => Coronary Calcium Score 657.  Diffuse RCA calcification.  Minimal mild disease in the LAD and  diagonal branches. == Overall limited study.  Notable artifact.   Essential hypertension    GERD (gastroesophageal reflux disease)    Hyperlipidemia    Malignant neoplasm of upper lobe of right lung ([facility]) 04/04/2020   OSA (obstructive sleep apnea)    With nighttime oxygen supplementation   T4, M3, M1 C Metastatic Small Cell Lung Cancer 03/2020   large right upper lobe/right hilar mass with ipsilateral and contralateral mediastinal and right supraclavicular lymphadenopathy in addition to multiple liver lesios. He has disease progression after the first line of chemotherapy in December 2021.   Testosterone deficiency    Type 2 diabetes mellitus (HCC)     ALLERGIES:  is allergic to bupropion and hydrochlorothiazide.  MEDICATIONS:  Current Outpatient Medications  Medication Sig Dispense Refill   albuterol (VENTOLIN HFA) 108 (90 Base) MCG/ACT inhaler Inhale 2 puffs into the lungs every 6 (six) hours as needed for wheezing or shortness of breath. 18 g 0   amLODipine (NORVASC) 10 MG tablet TAKE 1 TABLET BY MOUTH EVERY DAY FOR BLOOD PRESSURE 90 tablet 1   B-D 3CC LUER-LOK SYR 22GX1" 22G X 1" 3 ML MISC USE AS INSTRUCTED FOR TESTOSTERONE INJECTION EVERY 2 WEEKS 10 each 2   bisoprolol (ZEBETA) 5 MG tablet TAKE 1 TABLET (5 MG TOTAL) BY MOUTH DAILY. 90 tablet 0   Black Pepper-Turmeric (TURMERIC COMPLEX/BLACK PEPPER PO) Take 1 tablet by mouth in the morning and at bedtime.     blood glucose meter kit and supplies KIT Dispense based on patient and insurance preference. Use up to four times daily as directed. 1 each 0   chlorpheniramine-HYDROcodone (TUSSIONEX) 10-8 MG/5ML Take 5 mLs by mouth every 12 (twelve) hours as needed for cough. 473 mL 0   Coenzyme Q10 (COQ10) 200 MG CAPS Take 200 mg by mouth at bedtime.     ELIQUIS 5 MG TABS tablet TAKE 1 TABLET BY MOUTH TWICE A DAY 180 tablet 2   esomeprazole (NEXIUM) 20 MG capsule Take 20 mg by mouth in the morning.     gabapentin (NEURONTIN) 300 MG capsule TAKE  1 CAPSULE (300 MG TOTAL) BY MOUTH 2 (TWO) TIMES DAILY. FOR BACK PAIN. 180 capsule 2   glipiZIDE (GLUCOTROL) 10 MG tablet TAKE 1 TABLET (10 MG TOTAL) BY MOUTH 2 (TWO) TIMES DAILY BEFORE A MEAL. FOR DIABETES. 180 tablet 1   glucose blood (ONETOUCH ULTRA) test strip USE UP TO 4 TIMES DAILY AS DIRECTED 400 strip 5   Insulin Glargine (BASAGLAR KWIKPEN) 100 UNIT/ML Inject 24 Units into the skin at bedtime. For diabetes. 30 mL 1   Insulin Pen Needle (BD PEN NEEDLE NANO U/F) 32G X 4 MM MISC Use with insulin as prescribed Dx Code: E11.9 200 each 3   ipratropium-albuterol (DUONEB) 0.5-2.5 (3) MG/3ML SOLN Take 3 mLs by nebulization every 4 (four) hours while awake for 3 days, THEN 3 mLs every 4 (four) hours as needed (shortness of breath or wheezing). 360 mL 0   KRILL OIL PO Take 1 capsule by mouth daily.     Lancets  MISC USE UP TO 3 TIMES DAILY AS DIRECTED 100 each 2   losartan (COZAAR) 50 MG tablet TAKE 1 TABLET (50 MG TOTAL) BY MOUTH DAILY. FOR BLOOD PRESSURE. 90 tablet 2   Melatonin 10 MG CAPS Take 10 mg by mouth at bedtime.     metFORMIN (GLUCOPHAGE) 1000 MG tablet TAKE 1 TABLET (1,000 MG TOTAL) BY MOUTH 2 (TWO) TIMES DAILY WITH A MEAL. FOR DIABETES 180 tablet 1   Multiple Vitamin (MULTI-VITAMINS) TABS Take 1 tablet by mouth in the morning.     oxymetazoline (AFRIN) 0.05 % nasal spray Place 1 spray into both nostrils daily as needed for congestion.     pioglitazone (ACTOS) 45 MG tablet TAKE 1 TABLET (45 MG TOTAL) BY MOUTH DAILY. FOR DIABETES. 90 tablet 1   pravastatin (PRAVACHOL) 40 MG tablet TAKE 1 TABLET BY MOUTH EVERY DAY IN THE EVENING FOR CHOLESTEROL 90 tablet 2   testosterone cypionate (DEPOTESTOSTERONE CYPIONATE) 200 MG/ML injection Inject 200 mg into the muscle every 14 (fourteen) days.     traMADol (ULTRAM) 50 MG tablet Take 1 tablet (50 mg total) by mouth at bedtime as needed for severe pain. 30 tablet 0   No current facility-administered medications for this visit.   Facility-Administered  Medications Ordered in Other Visits  Medication Dose Route Frequency Provider Last Rate Last Admin   acetaminophen (TYLENOL) tablet 650 mg  650 mg Oral Once November Sypher L, PA-C       cetirizine (QUZYTTIR) injection 10 mg  10 mg Intravenous Once Fadel Clason L, PA-C       sodium chloride flush (NS) 0.9 % injection 10 mL  10 mL Intracatheter PRN Ajanay Farve L, PA-C        SURGICAL HISTORY:  Past Surgical History:  Procedure Laterality Date   CHEST TUBE INSERTION Right 01/20/2022   Procedure: CHEST TUBE INSERTION;  Surgeon: Candee Furbish, MD;  Location: [facility] ENDOSCOPY;  Service: Pulmonary;  Laterality: Right;  w/ Talc Pleurodesis, planned admit for Obs afterwards   CHEST TUBE INSERTION Right 04/28/2022   Procedure: INSERTION PLEURAL DRAINAGE CATHETER - Pigtail tail, drainage;  Surgeon: Candee Furbish, MD;  Location: [facility] ENDOSCOPY;  Service: Pulmonary;  Laterality: Right;  talc pleurodesis   CHEST TUBE INSERTION Right 09/01/2022   Procedure: INSERTION PLEURAL DRAINAGE CATHETER;  Surgeon: Juanito Doom, MD;  Location: [facility];  Service: Cardiopulmonary;  Laterality: Right;  pigtail catheter placement with talc pleurodesis   COLONOSCOPY WITH PROPOFOL N/A 12/17/2018   Procedure: COLONOSCOPY WITH PROPOFOL;  Surgeon: Virgel Manifold, MD;  Location: [facility] ENDOSCOPY;  Service: Gastroenterology;  Laterality: N/A;   IR IMAGING GUIDED PORT INSERTION  04/17/2020   IR THORACENTESIS ASP PLEURAL SPACE W/IMG GUIDE  11/29/2021   IR THORACENTESIS ASP PLEURAL SPACE W/IMG GUIDE  01/03/2022   JOINT REPLACEMENT Bilateral    REPLACEMENT TOTAL KNEE BILATERAL  2015   TALC PLEURODESIS  09/01/2022   Procedure: Pietro Cassis;  Surgeon: Juanito Doom, MD;  Location[facility] ENDOSCOPY;  Service: Cardiopulmonary;;   [place] ECHOCARDIOGRAM  05/2020   a) 05/2020: EF 55 to 60%.  No R WMA.  Mild LVH.  Indeterminate LVEDP.  Unable to assess RVP.  Normal  aortic and mitral valves.  Mildly elevated RAP.; b) 09/2021: EF 50-55%. No RWMA. Mild LVH. ~ LVEDP. Mild LA Dilation. NORMAL RV/RAP.  Normal MV/AoV.    REVIEW OF SYSTEMS:   Review of Systems  Constitutional: Positive for fatigue and weight loss.  Negative for chills and fever.  HENT: Negative for mouth sores, nosebleeds, sore throat and trouble swallowing.   Eyes: Negative for eye problems and icterus.  Respiratory: Positive for baseline dyspnea exertion.  Negative for cough, hemoptysis, and wheezing.   Cardiovascular: Negative for chest pain and leg swelling.  Gastrointestinal: Negative for abdominal pain, constipation, diarrhea, nausea and vomiting.  Genitourinary: Negative for bladder incontinence, difficulty urinating, dysuria, frequency and hematuria.   Musculoskeletal: Positive for chronic back pain and right hip pain.  Negative for gait problem, neck pain and neck stiffness.  Skin: Negative for itching and rash.  Neurological: Negative for dizziness, extremity weakness, gait problem, headaches, light-headedness and seizures.  Hematological: Negative for adenopathy. Does not bruise/bleed easily.  Psychiatric/Behavioral: Negative for confusion, depression and sleep disturbance. The patient is not nervous/anxious.     PHYSICAL EXAMINATION:  Blood pressure 110/67, pulse 93, temperature 98.2 F (36.8 C), temperature source Oral, resp. rate 20, height 5\' 8"  (1.727 m), weight 292 lb 12.8 oz (132.8 kg), SpO2 96 %.  ECOG PERFORMANCE STATUS: 1  Physical Exam  Constitutional: Oriented to person, place, and time and well-developed, well-nourished, and in no distress.  HENT:  Head: Normocephalic and atraumatic.  Mouth/Throat: Oropharynx is clear and moist. No oropharyngeal exudate.  Eyes: Conjunctivae are normal. Right eye exhibits no discharge. Left eye exhibits no discharge. No scleral icterus.  Neck: Normal range of motion. Neck supple.  Cardiovascular: Normal rate, regular rhythm,  normal heart sounds and intact distal pulses.   Pulmonary/Chest: Effort normal and breath sounds normal. No respiratory distress. No wheezes. No rales.  Abdominal: Soft. Bowel sounds are normal. Exhibits no distension and no mass. There is no tenderness.  Musculoskeletal: Normal range of motion. Exhibits no edema.  Lymphadenopathy:    No cervical adenopathy.  Neurological: Alert and oriented to person, place, and time. Exhibits normal muscle tone. Examined in the wheelchair.  Skin: Skin is warm and dry. No rash noted. Not diaphoretic. No erythema. No pallor.  Psychiatric: Mood, memory and judgment normal.  Vitals reviewed.  LABORATORY DATA: Lab Results  Component Value Date   WBC 4.5 03/09/2023   HGB 7.5 (L) 03/09/2023   HCT 22.4 (L) 03/09/2023   MCV 98.2 03/09/2023   PLT 70 (L) 03/09/2023      Chemistry      Component Value Date/Time   NA 140 03/09/2023 0955   K 3.6 03/09/2023 0955   CL 104 03/09/2023 0955   CO2 25 03/09/2023 0955   BUN 16 03/09/2023 0955   CREATININE 1.12 03/09/2023 0955   CREATININE 1.45 (H) 10/18/2021 1533      Component Value Date/Time   CALCIUM 7.6 (L) 03/09/2023 0955   ALKPHOS 70 03/09/2023 0955   AST 29 03/09/2023 0955   ALT 25 03/09/2023 0955   BILITOT 0.4 03/09/2023 0955       RADIOGRAPHIC STUDIES:  CT Chest W Contrast  Result Date: 02/13/2023 CLINICAL DATA:  Staging small-cell lung cancer. Known liver lesions. EXAM: CT CHEST, ABDOMEN, AND PELVIS WITH CONTRAST TECHNIQUE: Multidetector CT imaging of the chest, abdomen and pelvis was performed following the standard protocol during bolus administration of intravenous contrast. RADIATION DOSE REDUCTION: This exam was performed according to the departmental dose-optimization program which includes automated exposure control, adjustment of the mA and/or kV according to patient size and/or use of iterative reconstruction technique. CONTRAST:  122mL OMNIPAQUE IOHEXOL 350 MG/ML SOLN COMPARISON:  CT  11/14/2022 and older.  MRI 12/02/2022 FINDINGS: CT CHEST FINDINGS Cardiovascular: Right upper chest  port. Heart is nonenlarged. Coronary artery calcifications are seen. Trace pericardial fluid. The thoracic aorta has some mild atherosclerotic plaque. Slightly tortuous course to the descending thoracic aorta. Mediastinum/Nodes: Normal caliber thoracic esophagus. No specific abnormal lymph node enlargement seen in the axillary region or left hilum. Minimal soft tissue thickening in the right hilum without true abnormal node. Tissue seen anterior to the right bronchus on series 2, image 21 measures 2.4 by 1.5 cm today and previously 2.6 x 1.7 cm. This has demonstrated long-term stability going back to multiple scans. No mediastinal nodal enlargement Lungs/Pleura: Left lung is grossly clear. No consolidation, pneumothorax or effusion. No dominant nodule. The right lung has volume loss with central bronchiectasis and perihilar, paramediastinal thickening. Please correlate with prior radiation. Small right pleural effusion with some loculated components. There also pleural thickening with pleural calcifications which is stable. No new right-sided lung nodule. Musculoskeletal: Curvature of the spine with some degenerative changes. Stable sclerosis of the head of the left clavicle. CT ABDOMEN PELVIS FINDINGS Hepatobiliary: Once again there are liver lesions identified. The lesion seen inferiorly in segment 4 now has a adjacent fiduciary marker. Previously lesion measured 5.1 x 3.9 cm and today on series 2, image 56 3.8 by 2.8 cm. The smaller dome lesion which previously 17 x 14 mm, today on series 2, image 45 measures 16 by 12 mm. There is also a lesion along the caudate on series 2, image 54. This appears similar. Small punctate calcification in segment 6 of the liver on image 60. Patent portal vein. Contracted gallbladder. Pancreas: Global pancreatic atrophy without obvious mass or ductal dilatation. Spleen: Normal in  size without focal abnormality. Adrenals/Urinary Tract: Left adrenal gland is preserved. There is a right adrenal nodule once again identified which previously measured 15 x 11 mm. Today 15 x 11 mm on image 56 of series 2. Bosniak 1 right-sided renal cysts are again identified. No specific imaging follow-up. Bilateral mild renal atrophy. No collecting system dilatation. Punctate nonobstructing left-sided renal stone. The ureters have normal course and caliber down to the bladder. Bladder has a preserved contour. Stomach/Bowel: Large bowel has moderate colonic stool. No obstruction or dilatation. Normal appendix. The stomach is underdistended. Prominent second portion duodenal diverticulum. Small bowel is nondilated. Vascular/Lymphatic: Normal caliber aorta and IVC with atherosclerotic changes. No specific abnormal lymph node enlargement identified in the abdomen and pelvis. Reproductive: Prostate is unremarkable. Other: Mild anasarca.  Small fat containing umbilical hernia. Musculoskeletal: Mild degenerative changes along the spine. Degenerative changes of the pelvis particularly the right hip. IMPRESSION: Liver lesions are again identified appears similar except for decrease in size of the lesion in segment 4B. Stable right adrenal nodule. Unchanged appearance of the presumed posttreatment/radiation changes right perihilar with volume loss and distortion. Stable small complex right pleural effusion with some pleural thickening and calcification. No new developing mass lesion or lymph node enlargement at this time. Electronically Signed   By: Jill Side M.D.   On: 02/13/2023 14:36   CT Abdomen Pelvis W Contrast  Result Date: 02/13/2023 CLINICAL DATA:  Staging small-cell lung cancer. Known liver lesions. EXAM: CT CHEST, ABDOMEN, AND PELVIS WITH CONTRAST TECHNIQUE: Multidetector CT imaging of the chest, abdomen and pelvis was performed following the standard protocol during bolus administration of intravenous  contrast. RADIATION DOSE REDUCTION: This exam was performed according to the departmental dose-optimization program which includes automated exposure control, adjustment of the mA and/or kV according to patient size and/or use of iterative reconstruction technique. CONTRAST:  173mL  OMNIPAQUE IOHEXOL 350 MG/ML SOLN COMPARISON:  CT 11/14/2022 and older.  MRI 12/02/2022 FINDINGS: CT CHEST FINDINGS Cardiovascular: Right upper chest port. Heart is nonenlarged. Coronary artery calcifications are seen. Trace pericardial fluid. The thoracic aorta has some mild atherosclerotic plaque. Slightly tortuous course to the descending thoracic aorta. Mediastinum/Nodes: Normal caliber thoracic esophagus. No specific abnormal lymph node enlargement seen in the axillary region or left hilum. Minimal soft tissue thickening in the right hilum without true abnormal node. Tissue seen anterior to the right bronchus on series 2, image 21 measures 2.4 by 1.5 cm today and previously 2.6 x 1.7 cm. This has demonstrated long-term stability going back to multiple scans. No mediastinal nodal enlargement Lungs/Pleura: Left lung is grossly clear. No consolidation, pneumothorax or effusion. No dominant nodule. The right lung has volume loss with central bronchiectasis and perihilar, paramediastinal thickening. Please correlate with prior radiation. Small right pleural effusion with some loculated components. There also pleural thickening with pleural calcifications which is stable. No new right-sided lung nodule. Musculoskeletal: Curvature of the spine with some degenerative changes. Stable sclerosis of the head of the left clavicle. CT ABDOMEN PELVIS FINDINGS Hepatobiliary: Once again there are liver lesions identified. The lesion seen inferiorly in segment 4 now has a adjacent fiduciary marker. Previously lesion measured 5.1 x 3.9 cm and today on series 2, image 56 3.8 by 2.8 cm. The smaller dome lesion which previously 17 x 14 mm, today on series  2, image 45 measures 16 by 12 mm. There is also a lesion along the caudate on series 2, image 54. This appears similar. Small punctate calcification in segment 6 of the liver on image 60. Patent portal vein. Contracted gallbladder. Pancreas: Global pancreatic atrophy without obvious mass or ductal dilatation. Spleen: Normal in size without focal abnormality. Adrenals/Urinary Tract: Left adrenal gland is preserved. There is a right adrenal nodule once again identified which previously measured 15 x 11 mm. Today 15 x 11 mm on image 56 of series 2. Bosniak 1 right-sided renal cysts are again identified. No specific imaging follow-up. Bilateral mild renal atrophy. No collecting system dilatation. Punctate nonobstructing left-sided renal stone. The ureters have normal course and caliber down to the bladder. Bladder has a preserved contour. Stomach/Bowel: Large bowel has moderate colonic stool. No obstruction or dilatation. Normal appendix. The stomach is underdistended. Prominent second portion duodenal diverticulum. Small bowel is nondilated. Vascular/Lymphatic: Normal caliber aorta and IVC with atherosclerotic changes. No specific abnormal lymph node enlargement identified in the abdomen and pelvis. Reproductive: Prostate is unremarkable. Other: Mild anasarca.  Small fat containing umbilical hernia. Musculoskeletal: Mild degenerative changes along the spine. Degenerative changes of the pelvis particularly the right hip. IMPRESSION: Liver lesions are again identified appears similar except for decrease in size of the lesion in segment 4B. Stable right adrenal nodule. Unchanged appearance of the presumed posttreatment/radiation changes right perihilar with volume loss and distortion. Stable small complex right pleural effusion with some pleural thickening and calcification. No new developing mass lesion or lymph node enlargement at this time. Electronically Signed   By: Jill Side M.D.   On: 02/13/2023 14:36      ASSESSMENT/PLAN:  This is a very pleasant 72 year old Caucasian male diagnosed with extensive stage (T4, N3, M1C) small cell lung cancer presented with large right upper lobe lung mass in addition to mediastinal and right supraclavicular lymphadenopathy and multiple metastatic liver lesions diagnosed in April 2021.    The patient initially underwent systemic chemotherapy with carboplatin for an AUC  of 5 on day 1, etoposide 100 mg per metered squared on days 1, 2, and 3 in addition to [facility] for myeloprotection.  He also received immunotherapy with Imfinzi on day 1 of every chemotherapy cycle.  He is status post 8 cycles.  Starting from cycle #5 he was on single agent immunotherapy with Imfinzi IV every 4 weeks   The patient had evidence of disease progression on his scan from December 2021.   He was then started on systemic chemotherapy with carboplatin for an AUC of 5 on day 1, etoposide 100 mg per metered squared on days 1, 2, and 3 in addition to Tecentriq 1200 mg on day 1, the patient is status post 8 cycles.  Starting from cycle #5, the patient started maintenance immunotherapy with Tecentriq.  This was discontinued due to evidence of disease progression.   The patient then underwent Zepzelca IV every 3 weeks.  He is status post his 12 cycles and he tolerated it well except for fatigue a few days after treatment.  This was discontinued in February 2023 due to evidence of disease progression.    The patient then underwent treatment with irinotecan 65 mg per metered squared on days 1 and 8 IV every 3 weeks.  The patient is status post 3 cycles and tolerated it fairly well without any concerning adverse side effects.  This was discontinued secondary to disease progression.   Dr. Julien Nordmann saw the patient after this to discuss the options including palliative care and hospice versus single agent Gemzar, or Taxol.  He also discussed repeating his initial treatment with carboplatin and etoposide  since he had a good response to treatment in the past.   He was referred to Dr. Mariel Kansky at [facility]  for consideration of enrollment in the clinical trial with [email or web address].    He also saw Dr. Mindi Junker at [facility] for consideration of enrollment in the [id number] [id number] trial.  The patient was not interested due to the travel burden.   The patient was interested in restarting systemic chemotherapy with carboplatin for an AUC of 5 on day 1 and etoposide 100 mg per metered squared on days 1, 2, and 3 with Cosela. He is status post 13 cycles and tolerated it well. His dose of carboplatin was reduced to an AUC of 4 and etoposide 80 mg per metered squared.    He underwent SBRT to this which was completed on 01/27/23.   Due to the lack of any good options, Dr. Julien Nordmann recommended he continue on his systemic therapy for now.    Labs were reviewed.  Patient was seen with Dr. Julien Nordmann today.  The patient continues to have pancytopenia with treatment.  His WBCs have improved after receiving Zarzio last week.  He continues to have thrombocytopenia with a platelet count of 70,000.  His hemoglobin is 7.5.    Lab parameters are not met for treatment today.  Will tentatively plan on delaying his treatment by 1 week.  Moving forward, we will arrange for Neulasta to be given on day 5 of every cycle.  Instead of receiving treatment today, he will receive 2 units of blood.  The patient is okay to continue taking his Eliquis for now.  I reviewed with the patient and his wife that we generally would recommend that he hold his Eliquis for platelet count less than 50,000.  We will see the patient back for follow-up visit in 1 week for repeat labs before considering starting cycle number 13  Continue to monitor her's labs closely on a weekly basis.  I have added to sample to blood bank's on his weekly lab visits in case he needs blood transfusion.  The patient was advised to call immediately if he has any concerning symptoms in  the interval. The patient voices understanding of current disease status and treatment options and is in agreement with the current care plan. All questions were answered. The patient knows to call the clinic with any problems, questions or concerns. We can certainly see the patient much sooner if necessary         Orders Placed This Encounter  Procedures   CBC with Differential ([facility] Only)    Standing Status:   Future    Standing Expiration Date:   03/15/2024   CMP ([facility] only)    Standing Status:   Future    Standing Expiration Date:   03/15/2024   CBC with Differential ([facility] Only)    Standing Status:   Future    Standing Expiration Date:   04/26/2024   CMP ([facility] only)    Standing Status:   Future    Standing Expiration Date:   04/26/2024   CBC with Differential ([facility] Only)    Standing Status:   Future    Standing Expiration Date:   05/17/2024   CMP ([facility] only)    Standing Status:   Future    Standing Expiration Date:   05/17/2024   CMP ([facility] only)    Standing Status:   Standing    Number of Occurrences:   12    Standing Expiration Date:   03/08/2024   CBC with Differential ([facility] Only)    Standing Status:   Standing    Number of Occurrences:   12    Standing Expiration Date:   03/08/2024   Informed Consent Details: Physician/Practitioner Attestation; Transcribe to consent form and obtain patient signature    Standing Status:   Future    Number of Occurrences:   1    Standing Expiration Date:   03/08/2024    Order Specific Question:   Physician/Practitioner attestation of informed consent for blood and or blood product transfusion    Answer:   I, the physician/practitioner, attest that I have discussed with the patient the benefits, risks, side effects, alternatives, likelihood of achieving goals and potential problems during recovery for the procedure that I have provided informed consent.    Order Specific  Question:   Product(s)    Answer:   All Product(s)   Care order/instruction    Transfuse Parameters    Standing Status:   Future    Number of Occurrences:   1    Standing Expiration Date:   03/08/2024   Type and screen         Standing Status:   Future    Number of Occurrences:   1    Standing Expiration Date:   03/08/2024   Prepare RBC (crossmatch)    Standing Status:   Standing    Number of Occurrences:   1    Order Specific Question:   # of Units    Answer:   2 units    Order Specific Question:   Transfusion Indications    Answer:   Hemoglobin 8 gm/dL or less and orthopedic or cardiac surgery or pre-existing cardiac condition    Order Specific Question:   Number of Units to Keep Ahead    Answer:  NO units ahead    Order Specific Question:   If emergent release call blood bank    Answer:   Not emergent release      Dylann Gallier L Shameka Aggarwal, PA-C 03/09/23  ADDENDUM: Hematology/Oncology Attending:  I had a face-to-face encounter with the patient today.  I reviewed his record, lab, and recommended his care plan.  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  He is status post several chemotherapy regimens as well as immunotherapy.  He also treated with SBRT to progressive liver lesions.  The patient is currently on systemic chemotherapy with reduced dose carboplatin and etoposide.  He has been tolerating the treatment well.  He was supposed to start cycle #14 today but blood work showed persistent thrombocytopenia and he does not meet the parameters for the treatment today. We will delay his treatment by 1 week until recovery of the platelets count.  For the chemotherapy-induced neutropenia, we will arrange for the patient to receive Neulasta injection on day 5 of his treatment starting from cycle #14. He will come back for follow-up visit next week for evaluation before his  treatment. He was advised to call immediately if he has any other concerning symptoms in the interval. The total time spent in the appointment was 30 minutes. Disclaimer: This note was dictated with voice recognition software. Similar sounding words can inadvertently be transcribed and may be missed upon review.

## 2023-03-06 ENCOUNTER — Inpatient Hospital Stay: Payer: Medicare Other

## 2023-03-06 VITALS — BP 109/76 | HR 103 | Temp 98.7°F | Resp 18

## 2023-03-06 DIAGNOSIS — Z5111 Encounter for antineoplastic chemotherapy: Secondary | ICD-10-CM | POA: Diagnosis not present

## 2023-03-06 DIAGNOSIS — D61810 Antineoplastic chemotherapy induced pancytopenia: Secondary | ICD-10-CM | POA: Diagnosis not present

## 2023-03-06 DIAGNOSIS — Z452 Encounter for adjustment and management of vascular access device: Secondary | ICD-10-CM | POA: Diagnosis not present

## 2023-03-06 DIAGNOSIS — Z95828 Presence of other vascular implants and grafts: Secondary | ICD-10-CM

## 2023-03-06 DIAGNOSIS — C3411 Malignant neoplasm of upper lobe, right bronchus or lung: Secondary | ICD-10-CM | POA: Diagnosis not present

## 2023-03-06 DIAGNOSIS — Z5189 Encounter for other specified aftercare: Secondary | ICD-10-CM | POA: Diagnosis not present

## 2023-03-06 DIAGNOSIS — C787 Secondary malignant neoplasm of liver and intrahepatic bile duct: Secondary | ICD-10-CM | POA: Diagnosis not present

## 2023-03-06 MED ORDER — FILGRASTIM-AAFI 480 MCG/0.8ML IJ SOSY
480.0000 ug | PREFILLED_SYRINGE | Freq: Once | INTRAMUSCULAR | Status: AC
  Administered 2023-03-06: 480 ug via SUBCUTANEOUS
  Filled 2023-03-06: qty 0.8

## 2023-03-06 MED FILL — Dexamethasone Sodium Phosphate Inj 100 MG/10ML: INTRAMUSCULAR | Qty: 1 | Status: AC

## 2023-03-06 MED FILL — Fosaprepitant Dimeglumine For IV Infusion 150 MG (Base Eq): INTRAVENOUS | Qty: 5 | Status: AC

## 2023-03-06 NOTE — Patient Instructions (Signed)
Filgrastim Injection What is this medication? FILGRASTIM (fil GRA stim) lowers the risk of infection in people who are receiving chemotherapy. It works by helping your body make more white blood cells, which protects your body from infection. It may also be used to help people who have been exposed to high doses of radiation. It can be used to help prepare your body before a stem cell transplant. It works by helping your bone marrow make and release stem cells into the blood. This medicine may be used for other purposes; ask your health care provider or pharmacist if you have questions. COMMON BRAND NAME(S): Neupogen, Nivestym, Releuko, Zarxio What should I tell my care team before I take this medication? They need to know if you have any of these conditions: History of blood diseases, such as sickle cell anemia Kidney disease Recent or ongoing radiation An unusual or allergic reaction to filgrastim, pegfilgrastim, latex, rubber, other medications, foods, dyes, or preservatives Pregnant or trying to get pregnant Breast-feeding How should I use this medication? This medication is injected under the skin or into a vein. It is usually given by your care team in a hospital or clinic setting. It may be given at home. If you get this medication at home, you will be taught how to prepare and give it. Use exactly as directed. Take it as directed on the prescription label at the same time every day. Keep taking it unless your care team tells you to stop. It is important that you put your used needles and syringes in a special sharps container. Do not put them in a trash can. If you do not have a sharps container, call your pharmacist or care team to get one. This medication comes with INSTRUCTIONS FOR USE. Ask your pharmacist for directions on how to use this medication. Read the information carefully. Talk to your pharmacist or care team if you have questions. Talk to your care team about the use of this  medication in children. While it may be prescribed for children for selected conditions, precautions do apply. Overdosage: If you think you have taken too much of this medicine contact a poison control center or emergency room at once. NOTE: This medicine is only for you. Do not share this medicine with others. What if I miss a dose? It is important not to miss any doses. Talk to your care team about what to do if you miss a dose. What may interact with this medication? Medications that may cause a release of neutrophils, such as lithium This list may not describe all possible interactions. Give your health care provider a list of all the medicines, herbs, non-prescription drugs, or dietary supplements you use. Also tell them if you smoke, drink alcohol, or use illegal drugs. Some items may interact with your medicine. What should I watch for while using this medication? Your condition will be monitored carefully while you are receiving this medication. You may need bloodwork while taking this medication. Talk to your care team about your risk of cancer. You may be more at risk for certain types of cancer if you take this medication. What side effects may I notice from receiving this medication? Side effects that you should report to your care team as soon as possible: Allergic reactions--skin rash, itching, hives, swelling of the face, lips, tongue, or throat Capillary leak syndrome--stomach or muscle pain, unusual weakness or fatigue, feeling faint or lightheaded, decrease in the amount of urine, swelling of the ankles, hands, or   feet, trouble breathing High white blood cell level--fever, fatigue, trouble breathing, night sweats, change in vision, weight loss Inflammation of the aorta--fever, fatigue, back, chest, or stomach pain, severe headache Kidney injury (glomerulonephritis)--decrease in the amount of urine, red or dark brown urine, foamy or bubbly urine, swelling of the ankles, hands, or  feet Shortness of breath or trouble breathing Spleen injury--pain in upper left stomach or shoulder Unusual bruising or bleeding Side effects that usually do not require medical attention (report to your care team if they continue or are bothersome): Back pain Bone pain Fatigue Fever Headache Nausea This list may not describe all possible side effects. Call your doctor for medical advice about side effects. You may report side effects to FDA at 1-800-FDA-1088. Where should I keep my medication? Keep out of the reach of children and pets. Keep this medication in the original packaging until you are ready to take it. Protect from light. See product for storage information. Each product may have different instructions. Get rid of any unused medication after the expiration date. To get rid of medications that are no longer needed or have expired: Take the medication to a medications take-back program. Check with your pharmacy or law enforcement to find a location. If you cannot return the medication, ask your pharmacist or care team how to get rid of this medication safely. NOTE: This sheet is a summary. It may not cover all possible information. If you have questions about this medicine, talk to your doctor, pharmacist, or health care provider.  2023 Elsevier/Gold Standard (2022-03-11 00:00:00)  

## 2023-03-09 ENCOUNTER — Other Ambulatory Visit: Payer: Self-pay | Admitting: Radiation Therapy

## 2023-03-09 ENCOUNTER — Inpatient Hospital Stay: Payer: Medicare Other

## 2023-03-09 ENCOUNTER — Telehealth: Payer: Self-pay | Admitting: Internal Medicine

## 2023-03-09 ENCOUNTER — Inpatient Hospital Stay (HOSPITAL_BASED_OUTPATIENT_CLINIC_OR_DEPARTMENT_OTHER): Payer: Medicare Other | Admitting: Physician Assistant

## 2023-03-09 ENCOUNTER — Ambulatory Visit
Admission: RE | Admit: 2023-03-09 | Discharge: 2023-03-09 | Disposition: A | Discharge status: Home / Self Care | Payer: Medicare Other | Source: Ambulatory Visit | Attending: Nurse Practitioner | Admitting: Nurse Practitioner

## 2023-03-09 VITALS — BP 110/67 | HR 93 | Temp 98.2°F | Resp 20 | Ht 68.0 in | Wt 292.8 lb

## 2023-03-09 DIAGNOSIS — C3411 Malignant neoplasm of upper lobe, right bronchus or lung: Secondary | ICD-10-CM

## 2023-03-09 DIAGNOSIS — T451X5A Adverse effect of antineoplastic and immunosuppressive drugs, initial encounter: Secondary | ICD-10-CM | POA: Diagnosis not present

## 2023-03-09 DIAGNOSIS — Z5189 Encounter for other specified aftercare: Secondary | ICD-10-CM | POA: Diagnosis not present

## 2023-03-09 DIAGNOSIS — D6481 Anemia due to antineoplastic chemotherapy: Secondary | ICD-10-CM | POA: Diagnosis not present

## 2023-03-09 DIAGNOSIS — Z95828 Presence of other vascular implants and grafts: Secondary | ICD-10-CM

## 2023-03-09 DIAGNOSIS — Z5111 Encounter for antineoplastic chemotherapy: Secondary | ICD-10-CM | POA: Diagnosis not present

## 2023-03-09 DIAGNOSIS — Z452 Encounter for adjustment and management of vascular access device: Secondary | ICD-10-CM | POA: Diagnosis not present

## 2023-03-09 DIAGNOSIS — C787 Secondary malignant neoplasm of liver and intrahepatic bile duct: Secondary | ICD-10-CM | POA: Diagnosis not present

## 2023-03-09 DIAGNOSIS — C3490 Malignant neoplasm of unspecified part of unspecified bronchus or lung: Secondary | ICD-10-CM

## 2023-03-09 DIAGNOSIS — D61810 Antineoplastic chemotherapy induced pancytopenia: Secondary | ICD-10-CM | POA: Diagnosis not present

## 2023-03-09 LAB — CBC WITH DIFFERENTIAL (CANCER CENTER ONLY)
Abs Immature Granulocytes: 0.46 10*3/uL — ABNORMAL HIGH (ref 0.00–0.07)
Basophils Absolute: 0.1 10*3/uL (ref 0.0–0.1)
Basophils Relative: 1 %
Eosinophils Absolute: 0.1 10*3/uL (ref 0.0–0.5)
Eosinophils Relative: 1 %
HCT: 22.4 % — ABNORMAL LOW (ref 39.0–52.0)
Hemoglobin: 7.5 g/dL — ABNORMAL LOW (ref 13.0–17.0)
Immature Granulocytes: 10 %
Lymphocytes Relative: 13 %
Lymphs Abs: 0.6 10*3/uL — ABNORMAL LOW (ref 0.7–4.0)
MCH: 32.9 pg (ref 26.0–34.0)
MCHC: 33.5 g/dL (ref 30.0–36.0)
MCV: 98.2 fL (ref 80.0–100.0)
Monocytes Absolute: 0.8 10*3/uL (ref 0.1–1.0)
Monocytes Relative: 18 %
Neutro Abs: 2.5 10*3/uL (ref 1.7–7.7)
Neutrophils Relative %: 57 %
Platelet Count: 70 10*3/uL — ABNORMAL LOW (ref 150–400)
RBC: 2.28 MIL/uL — ABNORMAL LOW (ref 4.22–5.81)
RDW: 19.7 % — ABNORMAL HIGH (ref 11.5–15.5)
WBC Count: 4.5 10*3/uL (ref 4.0–10.5)
nRBC: 1.6 % — ABNORMAL HIGH (ref 0.0–0.2)

## 2023-03-09 LAB — CMP (CANCER CENTER ONLY)
ALT: 25 U/L (ref 0–44)
AST: 29 U/L (ref 15–41)
Albumin: 3.7 g/dL (ref 3.5–5.0)
Alkaline Phosphatase: 70 U/L (ref 38–126)
Anion gap: 11 (ref 5–15)
BUN: 16 mg/dL (ref 8–23)
CO2: 25 mmol/L (ref 22–32)
Calcium: 7.6 mg/dL — ABNORMAL LOW (ref 8.9–10.3)
Chloride: 104 mmol/L (ref 98–111)
Creatinine: 1.12 mg/dL (ref 0.61–1.24)
GFR, Estimated: >60 mL/min (ref >60)
Glucose, Bld: 191 mg/dL — ABNORMAL HIGH (ref 70–99)
Potassium: 3.6 mmol/L (ref 3.5–5.1)
Sodium: 140 mmol/L (ref 135–145)
Total Bilirubin: 0.4 mg/dL (ref 0.3–1.2)
Total Protein: 5.9 g/dL — ABNORMAL LOW (ref 6.5–8.1)

## 2023-03-09 LAB — PREPARE RBC (CROSSMATCH)

## 2023-03-09 LAB — SAMPLE TO BLOOD BANK

## 2023-03-09 MED ORDER — SODIUM CHLORIDE 0.9% FLUSH
10.0000 mL | Freq: Once | INTRAVENOUS | Status: AC
  Administered 2023-03-09: 10 mL

## 2023-03-09 MED ORDER — HEPARIN SOD (PORK) LOCK FLUSH 100 UNIT/ML IV SOLN
500.0000 [IU] | Freq: Every day | INTRAVENOUS | Status: AC | PRN
  Administered 2023-03-09: 500 [IU]

## 2023-03-09 MED ORDER — SODIUM CHLORIDE 0.9% FLUSH
10.0000 mL | INTRAVENOUS | Status: AC | PRN
  Administered 2023-03-09: 10 mL

## 2023-03-09 MED ORDER — CETIRIZINE HCL 10 MG/ML IV SOLN
10.0000 mg | Freq: Once | INTRAVENOUS | Status: AC
  Administered 2023-03-09: 10 mg via INTRAVENOUS
  Filled 2023-03-09: qty 1

## 2023-03-09 MED ORDER — ACETAMINOPHEN 325 MG PO TABS
650.0000 mg | ORAL_TABLET | Freq: Once | ORAL | Status: AC
  Administered 2023-03-09: 650 mg via ORAL
  Filled 2023-03-09: qty 2

## 2023-03-09 MED ORDER — SODIUM CHLORIDE 0.9% IV SOLUTION
250.0000 mL | Freq: Once | INTRAVENOUS | Status: AC
  Administered 2023-03-09: 250 mL via INTRAVENOUS

## 2023-03-09 MED FILL — Dexamethasone Sodium Phosphate Inj 100 MG/10ML: INTRAMUSCULAR | Qty: 1 | Status: AC

## 2023-03-09 NOTE — Patient Instructions (Signed)
Blood Transfusion, Adult, Care After The following information offers guidance on how to care for yourself after your procedure. Your health care provider may also give you more specific instructions. If you have problems or questions, contact your health care provider. What can I expect after the procedure? After the procedure, it is common to have: Bruising and soreness where the IV was inserted. A headache. Follow these instructions at home: IV insertion site care     Follow instructions from your health care provider about how to take care of your IV insertion site. Make sure you: Wash your hands with soap and water for at least 20 seconds before and after you change your bandage (dressing). If soap and water are not available, use hand sanitizer. Change your dressing as told by your health care provider. Check your IV insertion site every day for signs of infection. Check for: Redness, swelling, or pain. Bleeding from the site. Warmth. Pus or a bad smell. General instructions Take over-the-counter and prescription medicines only as told by your health care provider. Rest as told by your health care provider. Return to your normal activities as told by your health care provider. Keep all follow-up visits. Lab tests may need to be done at certain periods to recheck your blood counts. Contact a health care provider if: You have itching or red, swollen areas of skin (hives). You have a fever or chills. You have pain in the head, back, or chest. You feel anxious or you feel weak after doing your normal activities. You have redness, swelling, warmth, or pain around the IV insertion site. You have blood coming from the IV insertion site that does not stop with pressure. You have pus or a bad smell coming from your IV insertion site. If you received your blood transfusion in an outpatient setting, you will be told whom to contact to report any reactions. Get help right away if: You  have symptoms of a serious allergic or immune system reaction, including: Trouble breathing or shortness of breath. Swelling of the face, feeling flushed, or widespread rash. Dark urine or blood in the urine. Fast heartbeat. These symptoms may be an emergency. Get help right away. Call 911. Do not wait to see if the symptoms will go away. Do not drive yourself to the hospital. Summary Bruising and soreness around the IV insertion site are common. Check your IV insertion site every day for signs of infection. Rest as told by your health care provider. Return to your normal activities as told by your health care provider. Get help right away for symptoms of a serious allergic or immune system reaction to the blood transfusion. This information is not intended to replace advice given to you by your health care provider. Make sure you discuss any questions you have with your health care provider. Document Revised: 02/28/2022 Document Reviewed: 02/28/2022 Elsevier Patient Education  2023 [employer]  

## 2023-03-09 NOTE — Telephone Encounter (Signed)
Scheduled per 03/25 los and work-queue, patient has been called and voicemail was left.

## 2023-03-09 NOTE — Progress Notes (Signed)
  Radiation Oncology         [phone]) [phone] ________________________________  Name: Adam Wise MRN: [id number]  Date of Service: 03/09/2023  DOB: 08/28/1951  Post Treatment Telephone Note  Diagnosis:  Extensive stage small cell carcinoma arising in the right chest overlapping the right upper lobe and right middle lobe and hilum with liver disease.    Intent: Curative  Radiation Treatment Dates:  01/14/2023 through 01/27/2023 SBRT Treatment   Site Technique Total Dose (Gy) Dose per Fx (Gy) Completed Fx Beam Energies  Liver: Liver Three targets in one isocenter IMRT 60/60 12 5/5 6XFFF   (as documented in provider EOT note)   The patient was available for call today.   Symptoms of fatigue have improved since completing therapy.  Symptoms of skin changes have improved since completing therapy.  Symptoms of nausea or vomiting have improved since completing therapy.   The patient has scheduled follow up with his medical oncologist Dr. Julien Nordmann as well as Dr. Mariel Kansky at [facility] for ongoing surveillance, and was encouraged to call if he develops concerns or questions regarding radiation.  This concludes the interview.   Leandra Kern, LPN

## 2023-03-10 ENCOUNTER — Inpatient Hospital Stay: Payer: Medicare Other

## 2023-03-10 ENCOUNTER — Other Ambulatory Visit: Payer: Self-pay | Admitting: Radiation Therapy

## 2023-03-10 ENCOUNTER — Ambulatory Visit: Payer: Medicare Other | Admitting: Physical Therapy

## 2023-03-10 ENCOUNTER — Telehealth: Payer: Self-pay | Admitting: Radiation Therapy

## 2023-03-10 DIAGNOSIS — M5136 Other intervertebral disc degeneration, lumbar region: Secondary | ICD-10-CM | POA: Diagnosis not present

## 2023-03-10 DIAGNOSIS — G8929 Other chronic pain: Secondary | ICD-10-CM | POA: Diagnosis not present

## 2023-03-10 DIAGNOSIS — M1611 Unilateral primary osteoarthritis, right hip: Secondary | ICD-10-CM | POA: Diagnosis not present

## 2023-03-10 DIAGNOSIS — M545 Low back pain, unspecified: Secondary | ICD-10-CM | POA: Diagnosis not present

## 2023-03-10 DIAGNOSIS — M533 Sacrococcygeal disorders, not elsewhere classified: Secondary | ICD-10-CM | POA: Diagnosis not present

## 2023-03-10 DIAGNOSIS — C3490 Malignant neoplasm of unspecified part of unspecified bronchus or lung: Secondary | ICD-10-CM

## 2023-03-10 LAB — BPAM RBC
Blood Product Expiration Date: 202403302359
Blood Product Expiration Date: 202404082359
ISSUE DATE / TIME: 202403251206
ISSUE DATE / TIME: 202403251206
Unit Type and Rh: 1700
Unit Type and Rh: 1700

## 2023-03-10 LAB — TYPE AND SCREEN
ABO/RH(D): B POS
Antibody Screen: NEGATIVE
Unit division: 0
Unit division: 0

## 2023-03-10 NOTE — Progress Notes (Signed)
Order entered to access port the day of brain MRI at [place].   Mont Dutton R.T.(R)(T) Radiation Special Procedures Navigator

## 2023-03-10 NOTE — Telephone Encounter (Signed)
Called and left a detailed message about pt's upcoming brain MRI and telephone follow-up in May. I included my contact information and encouraged him to call back with questions or conflicts.   Mont Dutton R.T.(R)(T) Radiation Special Procedures Navigator

## 2023-03-11 ENCOUNTER — Inpatient Hospital Stay: Payer: Medicare Other

## 2023-03-12 ENCOUNTER — Encounter: Payer: Self-pay | Admitting: Internal Medicine

## 2023-03-12 ENCOUNTER — Ambulatory Visit: Payer: Medicare Other | Admitting: Physical Therapy

## 2023-03-12 NOTE — Therapy (Signed)
OUTPATIENT PHYSICAL THERAPY THORACOLUMBAR TREATMENT/RECERT/ Physical Therapy Progress Note   Dates of reporting period  01/26/23   to   03/16/23    Patient Name: Adam Wise MRN: [id number] DOB:07-22-1951, 72 y.o., male Today's Date: 03/16/2023  END OF SESSION:  PT End of Session - 03/16/23 1036     Visit Number 10    Number of Visits 26    Date for PT Re-Evaluation 05/11/23    Authorization Type 10/10 eval 01/26/23    PT Start Time 1015    PT Stop Time 1100    PT Time Calculation (min) 45 min    Activity Tolerance Patient tolerated treatment well;No increased pain    Behavior During Therapy [facility] for tasks assessed/performed                     Past Medical History:  Diagnosis Date   Chickenpox    Chronic knee pain    Chronic low back pain    COPD exacerbation ([place]) 10/11/2021   COPD with exacerbation ([place]) 10/12/2021   Coronary artery calcification seen on CAT scan 11/2021   Coronary CTA 11/27/2021: Moderate to large right pleural effusion and compressive atelectasis right lung base. => Coronary Calcium Score 657.  Diffuse RCA calcification.  Minimal mild disease in the LAD and diagonal branches. == Overall limited study.  Notable artifact.   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 He has disease progression after the first line of chemotherapy in December 2021.   Testosterone deficiency    Type 2 diabetes mellitus ([place])    Past Surgical History:  Procedure Laterality Date   CHEST TUBE INSERTION Right 01/20/2022   Procedure: CHEST TUBE INSERTION;  Surgeon: Candee Furbish, MD;  Location: [facility] ENDOSCOPY;  Service: Pulmonary;  Laterality: Right;  w/ Talc Pleurodesis, planned admit for Obs afterwards   CHEST TUBE INSERTION Right 04/28/2022   Procedure: INSERTION PLEURAL DRAINAGE CATHETER - Pigtail tail, drainage;  Surgeon: Candee Furbish, MD;  Location: [facility] ENDOSCOPY;  Service: Pulmonary;  Laterality: Right;  talc pleurodesis   CHEST TUBE INSERTION Right 09/01/2022   Procedure: INSERTION PLEURAL DRAINAGE CATHETER;  Surgeon: Juanito Doom, MD;  Location: [place];  Service: Cardiopulmonary;  Laterality: Right;  pigtail catheter placement with talc pleurodesis   COLONOSCOPY WITH PROPOFOL N/A 12/17/2018   Procedure: COLONOSCOPY WITH PROPOFOL;  Surgeon: Virgel Manifold, MD;  Location: [facility] ENDOSCOPY;  Service: Gastroenterology;  Laterality: N/A;   IR IMAGING GUIDED PORT INSERTION  04/17/2020   IR THORACENTESIS ASP PLEURAL SPACE W/IMG GUIDE  11/29/2021   IR THORACENTESIS ASP PLEURAL SPACE W/IMG GUIDE  01/03/2022   JOINT REPLACEMENT Bilateral    REPLACEMENT TOTAL KNEE BILATERAL  2015   TALC PLEURODESIS  09/01/2022   Procedure: Pietro Cassis;  Surgeon: Juanito Doom, MD;  Location: [facility] ENDOSCOPY;  Service: Cardiopulmonary;;   [place] ECHOCARDIOGRAM  05/2020   a) 05/2020: EF 55 to 60%.  No R WMA.  Mild LVH.  Indeterminate LVEDP.  Unable to assess RVP.  Normal aortic and mitral valves.  Mildly elevated RAP.;  b) 09/2021: EF 50-55%. No RWMA. Mild LVH. ~ LVEDP. Mild LA Dilation. NORMAL RV/RAP.  Normal MV/AoV.   Patient Active Problem List   Diagnosis Date Noted   Hip pain 12/09/2022   Dizziness 11/12/2022   Chronic respiratory failure with hypoxia 09/18/2022   Pneumothorax 09/02/2022   Pneumothorax on right 09/01/2022   Recurrent pleural effusion 08/27/2022   Recurrent pleural effusion on right 01/16/2022   Preop cardiovascular exam 12/19/2021   Coronary Calcium Score 657 11/13/2021   Atypical chest pain 11/13/2021    Pleural effusion 10/29/2021   Acute on chronic respiratory failure with hypoxia 10/12/2021   Recurrent right pleural effusion 08/08/2021   Erectile dysfunction 10/26/2020   Port-A-Cath in place 06/11/2020   History of pulmonary embolism 05/15/2020   Small cell lung cancer, right upper lobe 04/12/2020   Chronic fatigue 04/12/2020   Encounter for antineoplastic chemotherapy 04/12/2020   Encounter for antineoplastic immunotherapy 04/12/2020   Malignant neoplasm of upper lobe of right lung 04/04/2020   Goals of care, counseling/discussion 03/30/2020   Exertional dyspnea 03/26/2020   Hyperlipidemia associated with type 2 diabetes mellitus 11/08/2019   Joint swelling 11/16/2018   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   Essential hypertension 09/09/2017   Type 2 diabetes mellitus 09/09/2017   GERD (gastroesophageal reflux disease) 09/09/2017   Testosterone deficiency 09/09/2017   OSA (obstructive sleep apnea) 09/09/2017   Chronic bilateral low back pain without sciatica 09/09/2017   H/O knee surgery 04/27/2014   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 Had relief for a day but pain returned.    PERTINENT HISTORY:  Patient is a very pleasant 72 year old male presenting to physical therapy for chronic low back pain without sciatica. Patient has been seen by this therapist in the past. Patient  additionally has relapsed extensive stage small cell lung cancer and now multiple metastatic liver lesions diagnosed in April 2021 with history of radiotherapy and chemotherapy. PMH includes chickenpox COPD, CAD, HTN, GERD, OSA, type II DM, bilateral TKA. Patient tried figure four and stomach pressing up.  Also having vertigo.   PAIN:  Are you having pain? 7/10 pain Yes: NPRS scale: worst pain: 10/10; least amount of pain 4/10 current pain 6-7/10 Pain location: R sided low back pain radiating to back of knee; sometimes feeling like wet pants of adduction. Pain description: radiating to back pain Aggravating factors: getting up,  Relieving factors: laying down in bed, bending over the sink  PRECAUTIONS: Other: back, cancer   WEIGHT BEARING RESTRICTIONS: No  FALLS:  Has patient fallen in last 6 months? No  LIVING ENVIRONMENT: Lives with: lives with their spouse Lives in: House/apartment Stairs: Yes: Internal: flight steps; on right going up and on left going up and External: 5 steps; on right going up and on left going up Has following equipment at home: Single point cane, Environmental consultant - 4 wheeled, Electronics engineer, and Grab bars  OCCUPATION: retired IT trainer  PLOF: Independent  PATIENT GOALS: to have less pain  NEXT MD VISIT: n/a  OBJECTIVE:   DIAGNOSTIC FINDINGS:  MR lumbar 2020 Advanced disc degeneration T12-L1 with mild spinal stenosis   Mild spinal stenosis L2-3   Moderate spinal stenosis L3-4 and L4-5  PATIENT SURVEYS:  FOTO 36  SCREENING FOR RED FLAGS: Bowel or bladder incontinence: No Spinal tumors: No Cauda equina syndrome: No Compression fracture: No Abdominal aneurysm: No  COGNITION: Overall cognitive status: Within functional limits for tasks assessed     SENSATION: Hot/Cold: Impaired R medial knee  MUSCLE LENGTH: Hamstrings: Right 25 deg; Left 40 deg   POSTURE: rounded shoulders, increased thoracic kyphosis, posterior pelvic tilt, and flexed trunk    PALPATION: Excessive muscle guarding along lumbar paraspinals and piriformis   LUMBAR ROM:   AROM eval  Flexion limited  Extension limited  Right lateral flexion limited  Left lateral flexion limited  Right rotation limited  Left rotation limited   (Blank rows = not tested)    LOWER EXTREMITY MMT:    MMT Right eval Left eval  Hip flexion 2+* 3  Hip extension    Hip abduction 3* 3  Hip adduction 3-* 3  Knee flexion 3-* 3  Knee extension 3 3  Ankle dorsiflexion 4 4  Ankle plantarflexion 4 4   (Blank rows = not tested)  LUMBAR SPECIAL TESTS:  Straight leg raise test: Positive, SI Compression/distraction test: Positive, FABER test: Negative, and scour negative; distraction positive   FUNCTIONAL TESTS:  5 times sit to stand: unable to perform due to pain 10 meter walk test: unable to perform due to pain Sit to stand: CGA due to extreme pain Supine,<>prone; slow, extra time due to pain  GAIT: Distance walked: 3 ft Assistive device utilized: Single point cane Level of assistance: CGA Comments: severe pain with walking  TODAY'S TREATMENT:                                                                                                                              DATE: 03/16/23  Goals: see  below for details  On large mat table:  Manual: Roller to R IT band x 4 minutes   Modalities:  Ultrasound:1.5 cm squared with 3 MHzx 8 minutes to right lateral hip (TFL down toward greater trochanter region)     PATIENT EDUCATION:  Education details: POC, anterior pelvic stretch Person educated: Patient and Spouse Education method: Explanation, Demonstration, Tactile cues, and Verbal cues Education comprehension: verbalized understanding, returned demonstration, verbal cues required, and tactile cues required  HOME EXERCISE PROGRAM: Anterior pelvic tilt 10x Hamstring stretch 30 seconds Lay prone 60 seconds   Access Code: [email or web address] URL:  [email or web address] Date: 01/29/2023 Prepared by: Janna Arch  Exercises - Seated Anterior Pelvic Tilt  - 1 x daily - 7 x weekly - 2 sets - 10 reps - 5 hold - Supine Anterior Pelvic Tilt  - 1 x daily - 7 x weekly - 2 sets - 10 reps - 5 hold - Seated Hamstring Stretch  - 1 x daily - 7 x weekly - 2 sets - 2 reps - 30 hold - Supine Transversus Abdominis Bracing - Hands on Ground  - 1 x daily - 7 x weekly - 2 sets - 10 reps - 5 hold  ASSESSMENT:  CLINICAL IMPRESSION: Patient is making significant progress towards functional mobility. His pain continues to be present but is measured at a lower level. Unfortunately pain continues to significantly impair patients quality of life. Due to patient's progress he will benefit from continued skilled physical therapy.  Patient's condition has the potential to improve in response to therapy. Maximum improvement is yet to be obtained. The anticipated improvement is attainable and reasonable in a generally predictable time.  Pt will continue to benefit from skilled therapy to address remaining deficits in order to improve overall QoL and return to PLOF.      OBJECTIVE IMPAIRMENTS: Abnormal gait, cardiopulmonary status limiting activity, decreased activity tolerance, decreased balance, decreased endurance, decreased mobility, difficulty walking, decreased ROM, decreased strength, hypomobility, impaired perceived functional ability, increased muscle spasms, impaired flexibility, impaired sensation, improper body mechanics, postural dysfunction, obesity, and pain.   ACTIVITY LIMITATIONS: carrying, lifting, bending, sitting, standing, squatting, sleeping, stairs, transfers, bed mobility, bathing, toileting, dressing, reach over head, hygiene/grooming, and locomotion level  PARTICIPATION LIMITATIONS: meal prep, cleaning, laundry, medication management, personal finances, interpersonal relationship, driving, shopping, community activity, occupation,  yard work, and church  PERSONAL FACTORS: Age, Behavior pattern, Education, Restaurant manager, fast food, Past/current experiences, Profession, Sex, Social background, Time since onset of injury/illness/exacerbation, and 3+ comorbidities: chickenpox COPD, CAD, HTN, GERD, OSA, type II DM, bilateral TKA  are also affecting patient's functional outcome.   REHAB POTENTIAL: Good  CLINICAL DECISION MAKING: Evolving/moderate complexity  EVALUATION COMPLEXITY: Moderate   GOALS: Goals reviewed with patient? Yes  SHORT TERM GOALS: Target date: 04/13/2023    Patient will be independent in home exercise program to improve strength/mobility for better functional independence with ADLs. Baseline: 2/12: given to patient 4/1: compliant Goal status: MET  2.  .  Patient will report a worst pain of  5/10 on VAS in low back to improve tolerance with ADLs and reduced symptoms with activities. Baseline: 2/12: 10/10 4/1: 6/10  Goal status: Partially Met    LONG TERM GOALS: Target date: 05/11/2023    Patient will increase FOTO score to equal to or greater than 48%    to demonstrate statistically significant improvement in mobility and quality of life.  Baseline: 2/12: 36% 4/1: 37% Goal status: Partially Met  2.  Patient will report a worst  pain of 3/10 on VAS in low back to improve tolerance with ADLs and reduced symptoms with activities.  Baseline: 2/12: 10/10 4/1: 6/10  Goal status: Partially Met  3.  Patient will increase 10 meter walk test to >1.16m/s as to improve gait speed for better community ambulation and to reduce fall risk Baseline: 2/12: unable to ambulate 10 meters 4/1: 17.98 seconds with SPC  Goal status: Partially Met   4.  Patient (> 38 years old) will complete five times sit to stand test in < 15 seconds indicating an increased LE strength and improved balance. Baseline: 2/12: unable to perform due to severe pain 4/1: 21 seconds with BUE support Goal status: Partially Met    PLAN:  PT FREQUENCY:  2x/week  PT DURATION: 8 weeks  PLANNED INTERVENTIONS: Therapeutic exercises, Therapeutic activity, Neuromuscular re-education, Balance training, Gait training, Patient/Family education, Self Care, Joint mobilization, Stair training, Vestibular training, Canalith repositioning, Orthotic/Fit training, DME instructions, Electrical stimulation, Spinal manipulation, Spinal mobilization, Cryotherapy, Moist heat, Splintting, Taping, Vasopneumatic device, Traction, Ultrasound, Manual therapy, and Re-evaluation.  PLAN FOR NEXT SESSION:  Plan for progress note visit #10; Discuss ortho visit (as patient has cancelled PT until seen by ortho); continue with manual technique and progressive ROM; modalities prn for pain relief.  Janna Arch PT  Physical Therapist - [facility]  03/16/23, 11:55 AM

## 2023-03-13 ENCOUNTER — Telehealth: Payer: Self-pay | Admitting: Radiation Therapy

## 2023-03-13 NOTE — Telephone Encounter (Signed)
Received a call back from Adam Wise about the May brain MRI that was scheduled for him. He asked that this be cancelled, he is not interested in having future scans. I will cancel per the patient's request and let his Rad Onc providers know.   Mont Dutton R.T.(R)(T) Radiation Special Procedures Navigator

## 2023-03-16 ENCOUNTER — Ambulatory Visit: Payer: Medicare Other | Attending: Primary Care

## 2023-03-16 DIAGNOSIS — M6281 Muscle weakness (generalized): Secondary | ICD-10-CM | POA: Diagnosis not present

## 2023-03-16 DIAGNOSIS — R262 Difficulty in walking, not elsewhere classified: Secondary | ICD-10-CM | POA: Insufficient documentation

## 2023-03-16 DIAGNOSIS — M5459 Other low back pain: Secondary | ICD-10-CM | POA: Insufficient documentation

## 2023-03-16 MED FILL — Fosaprepitant Dimeglumine For IV Infusion 150 MG (Base Eq): INTRAVENOUS | Qty: 5 | Status: AC

## 2023-03-16 MED FILL — Dexamethasone Sodium Phosphate Inj 100 MG/10ML: INTRAMUSCULAR | Qty: 1 | Status: AC

## 2023-03-17 ENCOUNTER — Other Ambulatory Visit: Payer: Self-pay

## 2023-03-17 ENCOUNTER — Inpatient Hospital Stay: Payer: Medicare Other

## 2023-03-17 ENCOUNTER — Encounter: Payer: Self-pay | Admitting: Medical Oncology

## 2023-03-17 ENCOUNTER — Inpatient Hospital Stay: Payer: Medicare Other | Attending: Internal Medicine

## 2023-03-17 ENCOUNTER — Inpatient Hospital Stay (HOSPITAL_BASED_OUTPATIENT_CLINIC_OR_DEPARTMENT_OTHER): Payer: Medicare Other | Admitting: Internal Medicine

## 2023-03-17 ENCOUNTER — Other Ambulatory Visit: Payer: Medicare Other

## 2023-03-17 DIAGNOSIS — T451X5A Adverse effect of antineoplastic and immunosuppressive drugs, initial encounter: Secondary | ICD-10-CM

## 2023-03-17 DIAGNOSIS — Z5111 Encounter for antineoplastic chemotherapy: Secondary | ICD-10-CM | POA: Diagnosis not present

## 2023-03-17 DIAGNOSIS — Z95828 Presence of other vascular implants and grafts: Secondary | ICD-10-CM

## 2023-03-17 DIAGNOSIS — D61810 Antineoplastic chemotherapy induced pancytopenia: Secondary | ICD-10-CM | POA: Insufficient documentation

## 2023-03-17 DIAGNOSIS — Z452 Encounter for adjustment and management of vascular access device: Secondary | ICD-10-CM | POA: Diagnosis not present

## 2023-03-17 DIAGNOSIS — C787 Secondary malignant neoplasm of liver and intrahepatic bile duct: Secondary | ICD-10-CM | POA: Insufficient documentation

## 2023-03-17 DIAGNOSIS — Z5189 Encounter for other specified aftercare: Secondary | ICD-10-CM | POA: Diagnosis not present

## 2023-03-17 DIAGNOSIS — C3411 Malignant neoplasm of upper lobe, right bronchus or lung: Secondary | ICD-10-CM

## 2023-03-17 LAB — SAMPLE TO BLOOD BANK

## 2023-03-17 LAB — CBC WITH DIFFERENTIAL (CANCER CENTER ONLY)
Abs Immature Granulocytes: 0.03 10*3/uL (ref 0.00–0.07)
Basophils Absolute: 0 10*3/uL (ref 0.0–0.1)
Basophils Relative: 1 %
Eosinophils Absolute: 0.1 10*3/uL (ref 0.0–0.5)
Eosinophils Relative: 1 %
HCT: 29.8 % — ABNORMAL LOW (ref 39.0–52.0)
Hemoglobin: 9.9 g/dL — ABNORMAL LOW (ref 13.0–17.0)
Immature Granulocytes: 1 %
Lymphocytes Relative: 9 %
Lymphs Abs: 0.5 10*3/uL — ABNORMAL LOW (ref 0.7–4.0)
MCH: 32.1 pg (ref 26.0–34.0)
MCHC: 33.2 g/dL (ref 30.0–36.0)
MCV: 96.8 fL (ref 80.0–100.0)
Monocytes Absolute: 0.5 10*3/uL (ref 0.1–1.0)
Monocytes Relative: 9 %
Neutro Abs: 4.8 10*3/uL (ref 1.7–7.7)
Neutrophils Relative %: 79 %
Platelet Count: 102 10*3/uL — ABNORMAL LOW (ref 150–400)
RBC: 3.08 MIL/uL — ABNORMAL LOW (ref 4.22–5.81)
RDW: 18.9 % — ABNORMAL HIGH (ref 11.5–15.5)
WBC Count: 6 10*3/uL (ref 4.0–10.5)
nRBC: 0 % (ref 0.0–0.2)

## 2023-03-17 LAB — CMP (CANCER CENTER ONLY)
ALT: 20 U/L (ref 0–44)
AST: 21 U/L (ref 15–41)
Albumin: 3.8 g/dL (ref 3.5–5.0)
Alkaline Phosphatase: 80 U/L (ref 38–126)
Anion gap: 10 (ref 5–15)
BUN: 21 mg/dL (ref 8–23)
CO2: 27 mmol/L (ref 22–32)
Calcium: 9 mg/dL (ref 8.9–10.3)
Chloride: 102 mmol/L (ref 98–111)
Creatinine: 0.98 mg/dL (ref 0.61–1.24)
GFR, Estimated: >60 mL/min (ref >60)
Glucose, Bld: 175 mg/dL — ABNORMAL HIGH (ref 70–99)
Potassium: 4.1 mmol/L (ref 3.5–5.1)
Sodium: 139 mmol/L (ref 135–145)
Total Bilirubin: 0.5 mg/dL (ref 0.3–1.2)
Total Protein: 6.4 g/dL — ABNORMAL LOW (ref 6.5–8.1)

## 2023-03-17 MED ORDER — TRILACICLIB DIHYDROCHLORIDE INJECTION 300 MG
240.0000 mg/m2 | Freq: Once | INTRAVENOUS | Status: AC
  Administered 2023-03-17: 600 mg via INTRAVENOUS
  Filled 2023-03-17: qty 40

## 2023-03-17 MED ORDER — SODIUM CHLORIDE 0.9 % IV SOLN
150.0000 mg | Freq: Once | INTRAVENOUS | Status: AC
  Administered 2023-03-17: 150 mg via INTRAVENOUS
  Filled 2023-03-17: qty 150
  Filled 2023-03-17: qty 5

## 2023-03-17 MED ORDER — SODIUM CHLORIDE 0.9 % IV SOLN
600.0000 mg | Freq: Once | INTRAVENOUS | Status: AC
  Administered 2023-03-17: 600 mg via INTRAVENOUS
  Filled 2023-03-17: qty 60

## 2023-03-17 MED ORDER — SODIUM CHLORIDE 0.9% FLUSH
10.0000 mL | Freq: Once | INTRAVENOUS | Status: AC
  Administered 2023-03-17: 10 mL

## 2023-03-17 MED ORDER — SODIUM CHLORIDE 0.9 % IV SOLN
80.0000 mg/m2 | Freq: Once | INTRAVENOUS | Status: AC
  Administered 2023-03-17: 200 mg via INTRAVENOUS
  Filled 2023-03-17: qty 10

## 2023-03-17 MED ORDER — SODIUM CHLORIDE 0.9 % IV SOLN: Freq: Once | INTRAVENOUS | Status: AC

## 2023-03-17 MED ORDER — PALONOSETRON HCL INJECTION 0.25 MG/5ML
0.2500 mg | Freq: Once | INTRAVENOUS | Status: AC
  Administered 2023-03-17: 0.25 mg via INTRAVENOUS
  Filled 2023-03-17: qty 5

## 2023-03-17 MED ORDER — SODIUM CHLORIDE 0.9 % IV SOLN
10.0000 mg | Freq: Once | INTRAVENOUS | Status: AC
  Administered 2023-03-17: 10 mg via INTRAVENOUS
  Filled 2023-03-17: qty 10
  Filled 2023-03-17: qty 1

## 2023-03-17 MED ORDER — FAMOTIDINE IN NACL 20-0.9 MG/50ML-% IV SOLN
20.0000 mg | Freq: Once | INTRAVENOUS | Status: AC
  Administered 2023-03-17: 20 mg via INTRAVENOUS
  Filled 2023-03-17: qty 50

## 2023-03-17 MED FILL — Dexamethasone Sodium Phosphate Inj 100 MG/10ML: INTRAMUSCULAR | Qty: 1 | Status: AC

## 2023-03-17 NOTE — Patient Instructions (Signed)
[place]  Discharge Instructions: Thank you for choosing [facility] to provide your oncology and hematology care.   If you have a lab appointment with the [facility], please go directly to the [facility] and check in at the registration area.   Wear comfortable clothing and clothing appropriate for easy access to any Portacath or PICC line.   We strive to give you quality time with your provider. You may need to reschedule your appointment if you arrive late (15 or more minutes).  Arriving late affects you and other patients whose appointments are after yours.  Also, if you miss three or more appointments without notifying the office, you may be dismissed from the clinic at the provider's discretion.      For prescription refill requests, have your pharmacy contact our office and allow 72 hours for refills to be completed.    Today you received the following chemotherapy and/or immunotherapy agents cosela, carboplatin, etoposide      To help prevent nausea and vomiting after your treatment, we encourage you to take your nausea medication as directed.  BELOW ARE SYMPTOMS THAT SHOULD BE REPORTED IMMEDIATELY: *FEVER GREATER THAN 100.4 F (38 C) OR HIGHER *CHILLS OR SWEATING *NAUSEA AND VOMITING THAT IS NOT CONTROLLED WITH YOUR NAUSEA MEDICATION *UNUSUAL SHORTNESS OF BREATH *UNUSUAL BRUISING OR BLEEDING *URINARY PROBLEMS (pain or burning when urinating, or frequent urination) *BOWEL PROBLEMS (unusual diarrhea, constipation, pain near the anus) TENDERNESS IN MOUTH AND THROAT WITH OR WITHOUT PRESENCE OF ULCERS (sore throat, sores in mouth, or a toothache) UNUSUAL RASH, SWELLING OR PAIN  UNUSUAL VAGINAL DISCHARGE OR ITCHING   Items with * indicate a potential emergency and should be followed up as soon as possible or go to the Emergency Department if any problems should occur.  Please show the CHEMOTHERAPY ALERT CARD or IMMUNOTHERAPY  ALERT CARD at check-in to the Emergency Department and triage nurse.  Should you have questions after your visit or need to cancel or reschedule your appointment, please contact [facility]  Dept: [phone]  and follow the prompts.  Office hours are 8:00 a.m. to 4:30 p.m. Monday - Friday. Please note that voicemails left after 4:00 p.m. may not be returned until the following business day.  We are closed weekends and major holidays. You have access to a nurse at all times for urgent questions. Please call the main number to the clinic Dept: [phone] and follow the prompts.   For any non-urgent questions, you may also contact your provider using MyChart. We now offer e-Visits for anyone 36 and older to request care online for non-urgent symptoms. For details visit [email or web address].   Also download the MyChart app! Go to the app store, search "MyChart", open the app, select [facility], and log in with your MyChart username and password.

## 2023-03-17 NOTE — Progress Notes (Signed)
Patient seen by Dr. Mohamed  Vitals are within treatment parameters.  Labs reviewed: and are within treatment parameters.  Per physician team, patient is ready for treatment and there are NO modifications to the treatment plan.  

## 2023-03-17 NOTE — Progress Notes (Unsigned)
Patient seen by Dr. Inez Pilgrim are {Blank single:19197::"not all within treatment parameters. ***","within treatment parameters."}  Labs reviewed: and are within treatment parameters.  Per physician team, patient is ready for treatment and there are NO modifications to the treatment plan.

## 2023-03-17 NOTE — Progress Notes (Signed)
[facility] [phone]   [phone]  OFFICE PROGRESS NOTE  [name], NP [place] [address]  DIAGNOSIS: Relapsed extensive stage (T4, N3, M1c)  small cell lung cancer diagnosed in April 2021 and presented with large right upper lobe/right hilar mass with ipsilateral and contralateral mediastinal and right supraclavicular lymphadenopathy in addition to multiple liver lesions. He has disease progression after the first line of chemotherapy in December 2021.  PRIOR THERAPY: 1) Palliative radiotherapy to the right upper lobe lung mass under the care of Dr. Lisbeth Renshaw. 2) Systemic chemotherapy with carboplatin for AUC of 5 on day 1, etoposide 100 mg/M2 on days 1, 2 and 3 in addition to Imfinzi 1500 mg IV every 3 weeks with chemotherapy then every 4 weeks for maintenance if the patient has no evidence for progression.  He will also receive Cosela 240 mg/m2 on the days of the chemotherapy.  Status post 9 cycles.  Starting from cycle #5 the patient will be on maintenance treatment with immunotherapy with Imfinzi 1500 mg IV every 4 weeks. Last dose of chemotherapy was given on November 13, 2020. This treatment was discontinued secondary to disease progression 3) Systemic chemotherapy with carboplatin for AUC of 5 on day 1, etoposide 100 mg/M2 on days 1, 2 and 3, Tecentriq 1200 mg IV every 3 weeks as well as Cosela 250 mg/M2 on the days of the chemotherapy every 3 weeks.  First dose December 18, 2020.  Status post 8 cycles. 4) Zepzelca (lurbinectedin) 3.2 mgm2 IV every 3 weeks. Last dose on 01/23/22. Status post 12 cycles.  5) Palliative systemic chemotherapy with irinotecan 65 mg/m2 on days 1 and 8 IV every 3 weeks.  Status post 3 cycles.  Last dose was given April 01, 2022 discontinued secondary to disease progression. 6) SBRT to the progressive liver lesions under the care of Dr. Lisbeth Renshaw.  Last fraction January 27, 2023.   CURRENT THERAPY:  systemic chemotherapy with carboplatin for AUC of 5 on day 1, etoposide 100 Mg/M2 on days 1, 2 and 3 with Cosela before the chemotherapy.  First dose expected to start on 05/28/2022.  Status post 13 cycles.  Starting from cycle #5 his carboplatin will be reduced to AUC of 4 and 2 etoposide 80 Mg/M2.  INTERVAL HISTORY: Adam Wise 72 y.o. male returns to the clinic today for follow-up visit accompanied by his wife.  The patient is feeling fine today with no concerning complaints except for lower back pain with radiation to the right leg.  He was seen by physical therapy and also seen by orthopedic surgery and received steroid injection.  He denied having any current chest pain, shortness of breath, cough or hemoptysis.  He has no nausea, vomiting, diarrhea or constipation.  He has no headache or visual changes.  He denied having any significant weight loss or night sweats.  He is here today for evaluation before starting cycle #14 of his treatment.   MEDICAL HISTORY: Past Medical History:  Diagnosis Date   Chickenpox    Chronic knee pain    Chronic low back pain    COPD exacerbation ([facility]) 10/11/2021   COPD with exacerbation ([facility]) 10/12/2021   Coronary artery calcification seen on CAT scan 11/2021   Coronary CTA 11/27/2021: Moderate to large right pleural effusion and compressive atelectasis right lung base. => Coronary Calcium Score 657.  Diffuse RCA calcification.  Minimal mild disease in the LAD and diagonal branches. ==  Overall limited study.  Notable artifact.   Essential hypertension    GERD (gastroesophageal reflux disease)    Hyperlipidemia    Malignant neoplasm of upper lobe of right lung ([facility]) 04/04/2020   OSA (obstructive sleep apnea)    With nighttime oxygen supplementation   T4, M3, M1 C Metastatic Small Cell Lung Cancer 03/2020   large right upper lobe/right hilar mass with ipsilateral and contralateral mediastinal and right supraclavicular lymphadenopathy in addition to multiple  liver lesios. He has disease progression after the first line of chemotherapy in December 2021.   Testosterone deficiency    Type 2 diabetes mellitus (HCC)     ALLERGIES:  is allergic to bupropion and hydrochlorothiazide.  MEDICATIONS:  Current Outpatient Medications  Medication Sig Dispense Refill   albuterol (VENTOLIN HFA) 108 (90 Base) MCG/ACT inhaler Inhale 2 puffs into the lungs every 6 (six) hours as needed for wheezing or shortness of breath. 18 g 0   amLODipine (NORVASC) 10 MG tablet TAKE 1 TABLET BY MOUTH EVERY DAY FOR BLOOD PRESSURE 90 tablet 1   B-D 3CC LUER-LOK SYR 22GX1" 22G X 1" 3 ML MISC USE AS INSTRUCTED FOR TESTOSTERONE INJECTION EVERY 2 WEEKS 10 each 2   bisoprolol (ZEBETA) 5 MG tablet TAKE 1 TABLET (5 MG TOTAL) BY MOUTH DAILY. 90 tablet 0   Black Pepper-Turmeric (TURMERIC COMPLEX/BLACK PEPPER PO) Take 1 tablet by mouth in the morning and at bedtime.     blood glucose meter kit and supplies KIT Dispense based on patient and insurance preference. Use up to four times daily as directed. 1 each 0   chlorpheniramine-HYDROcodone (TUSSIONEX) 10-8 MG/5ML Take 5 mLs by mouth every 12 (twelve) hours as needed for cough. 473 mL 0   Coenzyme Q10 (COQ10) 200 MG CAPS Take 200 mg by mouth at bedtime.     ELIQUIS 5 MG TABS tablet TAKE 1 TABLET BY MOUTH TWICE A DAY 180 tablet 2   esomeprazole (NEXIUM) 20 MG capsule Take 20 mg by mouth in the morning.     gabapentin (NEURONTIN) 300 MG capsule TAKE 1 CAPSULE (300 MG TOTAL) BY MOUTH 2 (TWO) TIMES DAILY. FOR BACK PAIN. 180 capsule 2   glipiZIDE (GLUCOTROL) 10 MG tablet TAKE 1 TABLET (10 MG TOTAL) BY MOUTH 2 (TWO) TIMES DAILY BEFORE A MEAL. FOR DIABETES. 180 tablet 1   glucose blood (ONETOUCH ULTRA) test strip USE UP TO 4 TIMES DAILY AS DIRECTED 400 strip 5   Insulin Glargine (BASAGLAR KWIKPEN) 100 UNIT/ML Inject 24 Units into the skin at bedtime. For diabetes. 30 mL 1   Insulin Pen Needle (BD PEN NEEDLE NANO U/F) 32G X 4 MM MISC Use with  insulin as prescribed Dx Code: E11.9 200 each 3   ipratropium-albuterol (DUONEB) 0.5-2.5 (3) MG/3ML SOLN Take 3 mLs by nebulization every 4 (four) hours while awake for 3 days, THEN 3 mLs every 4 (four) hours as needed (shortness of breath or wheezing). 360 mL 0   KRILL OIL PO Take 1 capsule by mouth daily.     Lancets MISC USE UP TO 3 TIMES DAILY AS DIRECTED 100 each 2   losartan (COZAAR) 50 MG tablet TAKE 1 TABLET (50 MG TOTAL) BY MOUTH DAILY. FOR BLOOD PRESSURE. 90 tablet 2   Melatonin 10 MG CAPS Take 10 mg by mouth at bedtime.     metFORMIN (GLUCOPHAGE) 1000 MG tablet TAKE 1 TABLET (1,000 MG TOTAL) BY MOUTH 2 (TWO) TIMES DAILY WITH A MEAL. FOR DIABETES 180 tablet 1  Multiple Vitamin (MULTI-VITAMINS) TABS Take 1 tablet by mouth in the morning.     oxymetazoline (AFRIN) 0.05 % nasal spray Place 1 spray into both nostrils daily as needed for congestion.     pioglitazone (ACTOS) 45 MG tablet TAKE 1 TABLET (45 MG TOTAL) BY MOUTH DAILY. FOR DIABETES. 90 tablet 1   pravastatin (PRAVACHOL) 40 MG tablet TAKE 1 TABLET BY MOUTH EVERY DAY IN THE EVENING FOR CHOLESTEROL 90 tablet 2   testosterone cypionate (DEPOTESTOSTERONE CYPIONATE) 200 MG/ML injection Inject 200 mg into the muscle every 14 (fourteen) days.     traMADol (ULTRAM) 50 MG tablet Take 1 tablet (50 mg total) by mouth at bedtime as needed for severe pain. 30 tablet 0   No current facility-administered medications for this visit.    SURGICAL HISTORY:  Past Surgical History:  Procedure Laterality Date   CHEST TUBE INSERTION Right 01/20/2022   Procedure: CHEST TUBE INSERTION;  Surgeon: Candee Furbish, MD;  Location[facility] ENDOSCOPY;  Service: Pulmonary;  Laterality: Right;  w/ Talc Pleurodesis, planned admit for Obs afterwards   CHEST TUBE INSERTION Right 04/28/2022   Procedure: INSERTION PLEURAL DRAINAGE CATHETER - Pigtail tail, drainage;  Surgeon: Candee Furbish, MD;  Location: [facility] ENDOSCOPY;  Service: Pulmonary;  Laterality: Right;  talc  pleurodesis   CHEST TUBE INSERTION Right 09/01/2022   Procedure: INSERTION PLEURAL DRAINAGE CATHETER;  Surgeon: Juanito Doom, MD;  Location: [facility];  Service: Cardiopulmonary;  Laterality: Right;  pigtail catheter placement with talc pleurodesis   COLONOSCOPY WITH PROPOFOL N/A 12/17/2018   Procedure: COLONOSCOPY WITH PROPOFOL;  Surgeon: Virgel Manifold, MD;  Location: [facility] ENDOSCOPY;  Service: Gastroenterology;  Laterality: N/A;   IR IMAGING GUIDED PORT INSERTION  04/17/2020   IR THORACENTESIS ASP PLEURAL SPACE W/IMG GUIDE  11/29/2021   IR THORACENTESIS ASP PLEURAL SPACE W/IMG GUIDE  01/03/2022   JOINT REPLACEMENT Bilateral    REPLACEMENT TOTAL KNEE BILATERAL  2015   TALC PLEURODESIS  09/01/2022   Procedure: Pietro Cassis;  Surgeon: Juanito Doom, MD;  Location: [facility] ENDOSCOPY;  Service: Cardiopulmonary;;   [place] ECHOCARDIOGRAM  05/2020   a) 05/2020: EF 55 to 60%.  No R WMA.  Mild LVH.  Indeterminate LVEDP.  Unable to assess RVP.  Normal aortic and mitral valves.  Mildly elevated RAP.; b) 09/2021: EF 50-55%. No RWMA. Mild LVH. ~ LVEDP. Mild LA Dilation. NORMAL RV/RAP.  Normal MV/AoV.    REVIEW OF SYSTEMS:  A comprehensive review of systems was negative except for: Constitutional: positive for fatigue Musculoskeletal: positive for arthralgias   PHYSICAL EXAMINATION: General appearance: alert, cooperative, fatigued, and no distress Head: Normocephalic, without obvious abnormality, atraumatic Neck: no adenopathy, no JVD, supple, symmetrical, trachea midline, and thyroid not enlarged, symmetric, no tenderness/mass/nodules Lymph nodes: Cervical, supraclavicular, and axillary nodes normal. Resp: clear to auscultation bilaterally Back: symmetric, no curvature. ROM normal. No CVA tenderness. Cardio: regular rate and rhythm, S1, S2 normal, no murmur, click, rub or gallop GI: soft, non-tender; bowel sounds normal; no masses,  no  organomegaly Extremities: extremities normal, atraumatic, no cyanosis or edema  ECOG PERFORMANCE STATUS: 1 - Symptomatic but completely ambulatory  Blood pressure 111/80, pulse 87, temperature 98.2 F (36.8 C), temperature source Tympanic, resp. rate 18, height 5\' 8"  (1.727 m), weight 292 lb 4.8 oz (132.6 kg), SpO2 97 %.   LABORATORY DATA: Lab Results  Component Value Date   WBC 4.5 03/09/2023   HGB 7.5 (L) 03/09/2023   HCT 22.4 (  L) 03/09/2023   MCV 98.2 03/09/2023   PLT 70 (L) 03/09/2023      Chemistry      Component Value Date/Time   NA 140 03/09/2023 0955   K 3.6 03/09/2023 0955   CL 104 03/09/2023 0955   CO2 25 03/09/2023 0955   BUN 16 03/09/2023 0955   CREATININE 1.12 03/09/2023 0955   CREATININE 1.45 (H) 10/18/2021 1533      Component Value Date/Time   CALCIUM 7.6 (L) 03/09/2023 0955   ALKPHOS 70 03/09/2023 0955   AST 29 03/09/2023 0955   ALT 25 03/09/2023 0955   BILITOT 0.4 03/09/2023 0955       RADIOGRAPHIC STUDIES:    ASSESSMENT AND PLAN: This is a very pleasant 72 year old Caucasian male diagnosed with extensive stage (T4, N3, M1C) small cell lung cancer presented with large right upper lobe lung mass in addition to mediastinal and right supraclavicular lymphadenopathy and multiple metastatic liver lesions diagnosed in April 2021. The patient initially underwent systemic chemotherapy with carboplatin for an AUC of 5 on day 1, etoposide 100 mg per metered squared on days 1, 2, and 3 in addition to [facility] for myeloprotection.  He also received immunotherapy with Imfinzi on day 1 of every chemotherapy cycle.  He is status post 9 cycles.  Starting from cycle #5 he was on single agent immunotherapy with Imfinzi IV every 4 weeks.  The patient had evidence of disease progression on his scan from December 2021.  He was then started on systemic chemotherapy with carboplatin for an AUC of 5 on day 1, etoposide 100 mg per metered squared on days 1, 2, and 3 in addition to  Tecentriq 1200 mg on day 1, the patient is status post 9 cycles.  Starting from cycle #5, the patient started maintenance immunotherapy with Tecentriq.  This was discontinued due to evidence of disease progression. The patient started third line treatment with Zepzelca (lurbinectedin) 3.2 Mg/M2 every 3-week status post 12 cycles of this treatment and he has been tolerating it fairly well except for fatigue.  This treatment was discontinued secondary to disease progression. He underwent fourth line treatment with systemic chemotherapy with irinotecan 65 Mg/M2 on days 1 and 8 every 3 weeks.  Status post 3 cycles.  He tolerated this treatment well with no concerning adverse effect except for mild fatigue. Unfortunately his scan showed evidence for disease progression especially in the liver. I recommended for the patient to discontinue his current treatment with irinotecan at this point. He started systemic chemotherapy again with carboplatin for AUC of 5 on day 1, etoposide 100 Mg/M2 on days 1, 2 and 3 with Cosela status post 13 cycles. I did reduce the dose of carboplatin to AUC of 4 and etoposide 80 Mg/M2 starting from cycle #5.  The patient has been tolerating this treatment fairly well except for the chemotherapy-induced pancytopenia.  His treatment was delayed from last week because of the low platelet count. I recommended for him to proceed with his treatment today as planned. He will come back for follow-up visit in 3 weeks for evaluation before the next cycle of his treatment. The patient was seen by Dr. Lennart Pall at [facility] for consideration of clinical trial but none is available for him at this point.  Dr. Mariel Kansky recommended for him to continue on the current systemic chemotherapy and palliative radiation. He was advised to call immediately if he has any other concerning symptoms in the interval. The patient voices  understanding of current disease status and treatment options and is in  agreement with the current care plan.  All questions were answered. The patient knows to call the clinic with any problems, questions or concerns. We can certainly see the patient much sooner if necessary.  The total time spent in the appointment was 20 minutes.  Disclaimer: This note was dictated with voice recognition software. Similar sounding words can inadvertently be transcribed and may not be corrected upon review. Eilleen Kempf, MD 03/17/23

## 2023-03-18 ENCOUNTER — Inpatient Hospital Stay: Payer: Medicare Other

## 2023-03-18 VITALS — BP 127/80 | HR 99 | Temp 97.8°F | Resp 18

## 2023-03-18 DIAGNOSIS — Z5111 Encounter for antineoplastic chemotherapy: Secondary | ICD-10-CM | POA: Diagnosis not present

## 2023-03-18 DIAGNOSIS — Z452 Encounter for adjustment and management of vascular access device: Secondary | ICD-10-CM | POA: Diagnosis not present

## 2023-03-18 DIAGNOSIS — Z5189 Encounter for other specified aftercare: Secondary | ICD-10-CM | POA: Diagnosis not present

## 2023-03-18 DIAGNOSIS — C787 Secondary malignant neoplasm of liver and intrahepatic bile duct: Secondary | ICD-10-CM | POA: Diagnosis not present

## 2023-03-18 DIAGNOSIS — D61810 Antineoplastic chemotherapy induced pancytopenia: Secondary | ICD-10-CM | POA: Diagnosis not present

## 2023-03-18 DIAGNOSIS — C3411 Malignant neoplasm of upper lobe, right bronchus or lung: Secondary | ICD-10-CM | POA: Diagnosis not present

## 2023-03-18 MED ORDER — SODIUM CHLORIDE 0.9 % IV SOLN
80.0000 mg/m2 | Freq: Once | INTRAVENOUS | Status: AC
  Administered 2023-03-18: 200 mg via INTRAVENOUS
  Filled 2023-03-18: qty 10

## 2023-03-18 MED ORDER — TRILACICLIB DIHYDROCHLORIDE INJECTION 300 MG
240.0000 mg/m2 | Freq: Once | INTRAVENOUS | Status: AC
  Administered 2023-03-18: 600 mg via INTRAVENOUS
  Filled 2023-03-18: qty 40

## 2023-03-18 MED ORDER — SODIUM CHLORIDE 0.9 % IV SOLN: Freq: Once | INTRAVENOUS | Status: AC

## 2023-03-18 MED ORDER — SODIUM CHLORIDE 0.9% FLUSH
10.0000 mL | INTRAVENOUS | Status: DC | PRN
  Administered 2023-03-18: 10 mL

## 2023-03-18 MED ORDER — SODIUM CHLORIDE 0.9 % IV SOLN
10.0000 mg | Freq: Once | INTRAVENOUS | Status: AC
  Administered 2023-03-18: 10 mg via INTRAVENOUS
  Filled 2023-03-18: qty 1
  Filled 2023-03-18: qty 10
  Filled 2023-03-18: qty 1

## 2023-03-18 MED ORDER — HEPARIN SOD (PORK) LOCK FLUSH 100 UNIT/ML IV SOLN
500.0000 [IU] | Freq: Once | INTRAVENOUS | Status: AC | PRN
  Administered 2023-03-18: 500 [IU]

## 2023-03-18 MED FILL — Dexamethasone Sodium Phosphate Inj 100 MG/10ML: INTRAMUSCULAR | Qty: 1 | Status: AC

## 2023-03-18 NOTE — Progress Notes (Signed)
While assessing pt's port upon arrival, the needle was found to be deaccessed underneath the drsg. Dressing removed and pt's port re-accessed

## 2023-03-18 NOTE — Patient Instructions (Signed)
[facility]  Discharge Instructions: Thank you for choosing [facility] to provide your oncology and hematology care.   If you have a lab appointment with the [facility], please go directly to the [facility] and check in at the registration area.   Wear comfortable clothing and clothing appropriate for easy access to any Portacath or PICC line.   We strive to give you quality time with your provider. You may need to reschedule your appointment if you arrive late (15 or more minutes).  Arriving late affects you and other patients whose appointments are after yours.  Also, if you miss three or more appointments without notifying the office, you may be dismissed from the clinic at the provider's discretion.      For prescription refill requests, have your pharmacy contact our office and allow 72 hours for refills to be completed.    Today you received the following chemotherapy and/or immunotherapy agents cosela, carboplatin, etoposide      To help prevent nausea and vomiting after your treatment, we encourage you to take your nausea medication as directed.  BELOW ARE SYMPTOMS THAT SHOULD BE REPORTED IMMEDIATELY: *FEVER GREATER THAN 100.4 F (38 C) OR HIGHER *CHILLS OR SWEATING *NAUSEA AND VOMITING THAT IS NOT CONTROLLED WITH YOUR NAUSEA MEDICATION *UNUSUAL SHORTNESS OF BREATH *UNUSUAL BRUISING OR BLEEDING *URINARY PROBLEMS (pain or burning when urinating, or frequent urination) *BOWEL PROBLEMS (unusual diarrhea, constipation, pain near the anus) TENDERNESS IN MOUTH AND THROAT WITH OR WITHOUT PRESENCE OF ULCERS (sore throat, sores in mouth, or a toothache) UNUSUAL RASH, SWELLING OR PAIN  UNUSUAL VAGINAL DISCHARGE OR ITCHING   Items with * indicate a potential emergency and should be followed up as soon as possible or go to the Emergency Department if any problems should occur.  Please show the CHEMOTHERAPY ALERT CARD or IMMUNOTHERAPY  ALERT CARD at check-in to the Emergency Department and triage nurse.  Should you have questions after your visit or need to cancel or reschedule your appointment, please contact [facility]  Dept: [phone]  and follow the prompts.  Office hours are 8:00 a.m. to 4:30 p.m. Monday - Friday. Please note that voicemails left after 4:00 p.m. may not be returned until the following business day.  We are closed weekends and major holidays. You have access to a nurse at all times for urgent questions. Please call the main number to the clinic Dept: [phone] and follow the prompts.   For any non-urgent questions, you may also contact your provider using MyChart. We now offer e-Visits for anyone 23 and older to request care online for non-urgent symptoms. For details visit [email or web address].   Also download the MyChart app! Go to the app store, search "MyChart", open the app, select [facility], and log in with your MyChart username and password.

## 2023-03-19 ENCOUNTER — Inpatient Hospital Stay: Payer: Medicare Other

## 2023-03-19 ENCOUNTER — Ambulatory Visit: Payer: Medicare Other

## 2023-03-19 VITALS — BP 134/89 | HR 88 | Temp 97.6°F | Resp 18

## 2023-03-19 DIAGNOSIS — C3411 Malignant neoplasm of upper lobe, right bronchus or lung: Secondary | ICD-10-CM | POA: Diagnosis not present

## 2023-03-19 DIAGNOSIS — M6281 Muscle weakness (generalized): Secondary | ICD-10-CM

## 2023-03-19 DIAGNOSIS — R262 Difficulty in walking, not elsewhere classified: Secondary | ICD-10-CM

## 2023-03-19 DIAGNOSIS — C787 Secondary malignant neoplasm of liver and intrahepatic bile duct: Secondary | ICD-10-CM | POA: Diagnosis not present

## 2023-03-19 DIAGNOSIS — Z452 Encounter for adjustment and management of vascular access device: Secondary | ICD-10-CM | POA: Diagnosis not present

## 2023-03-19 DIAGNOSIS — D61810 Antineoplastic chemotherapy induced pancytopenia: Secondary | ICD-10-CM | POA: Diagnosis not present

## 2023-03-19 DIAGNOSIS — Z5111 Encounter for antineoplastic chemotherapy: Secondary | ICD-10-CM | POA: Diagnosis not present

## 2023-03-19 DIAGNOSIS — M5459 Other low back pain: Secondary | ICD-10-CM

## 2023-03-19 DIAGNOSIS — Z5189 Encounter for other specified aftercare: Secondary | ICD-10-CM | POA: Diagnosis not present

## 2023-03-19 MED ORDER — SODIUM CHLORIDE 0.9% FLUSH
10.0000 mL | INTRAVENOUS | Status: DC | PRN
  Administered 2023-03-19: 10 mL

## 2023-03-19 MED ORDER — SODIUM CHLORIDE 0.9 % IV SOLN
80.0000 mg/m2 | Freq: Once | INTRAVENOUS | Status: AC
  Administered 2023-03-19: 200 mg via INTRAVENOUS
  Filled 2023-03-19: qty 10

## 2023-03-19 MED ORDER — TRILACICLIB DIHYDROCHLORIDE INJECTION 300 MG
240.0000 mg/m2 | Freq: Once | INTRAVENOUS | Status: AC
  Administered 2023-03-19: 600 mg via INTRAVENOUS
  Filled 2023-03-19: qty 40

## 2023-03-19 MED ORDER — PEGFILGRASTIM 6 MG/0.6ML ~~LOC~~ PSKT
6.0000 mg | PREFILLED_SYRINGE | Freq: Once | SUBCUTANEOUS | Status: AC
  Administered 2023-03-19: 6 mg via SUBCUTANEOUS
  Filled 2023-03-19: qty 0.6

## 2023-03-19 MED ORDER — SODIUM CHLORIDE 0.9 % IV SOLN
10.0000 mg | Freq: Once | INTRAVENOUS | Status: AC
  Administered 2023-03-19: 10 mg via INTRAVENOUS
  Filled 2023-03-19: qty 10

## 2023-03-19 MED ORDER — SODIUM CHLORIDE 0.9 % IV SOLN: Freq: Once | INTRAVENOUS | Status: AC

## 2023-03-19 MED ORDER — HEPARIN SOD (PORK) LOCK FLUSH 100 UNIT/ML IV SOLN
500.0000 [IU] | Freq: Once | INTRAVENOUS | Status: AC | PRN
  Administered 2023-03-19: 500 [IU]

## 2023-03-19 NOTE — Therapy (Signed)
OUTPATIENT PHYSICAL THERAPY THORACOLUMBAR TREATMENT   Patient Name: Adam Wise MRN: [id number] DOB:[birth year], 72 y.o., male Today's Date: 03/19/2023  END OF SESSION:  PT End of Session - 03/19/23 1433     Visit Number 11    Number of Visits 26    Date for PT Re-Evaluation 05/11/23    Authorization Type 10/10 eval 01/26/23    PT Start Time 1346    PT Stop Time 1428    PT Time Calculation (min) 42 min    Activity Tolerance Patient tolerated treatment well;No increased pain    Behavior During Therapy [facility] for tasks assessed/performed                     Past Medical History:  Diagnosis Date   Chickenpox    Chronic knee pain    Chronic low back pain    COPD exacerbation 10/11/2021   COPD with exacerbation 10/12/2021   Coronary artery calcification seen on CAT scan 11/2021   Coronary CTA 11/27/2021: Moderate to large right pleural effusion and compressive atelectasis right lung base. => Coronary Calcium Score 657.  Diffuse RCA calcification.  Minimal mild disease in the LAD and diagonal branches. == Overall limited study.  Notable artifact.   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 He has disease progression after the first line of chemotherapy in December 2021.   Testosterone deficiency    Type 2 diabetes mellitus    Past Surgical History:  Procedure Laterality Date   CHEST TUBE INSERTION Right 01/20/2022   Procedure: CHEST TUBE INSERTION;  Surgeon: Candee Furbish, MD;  Location: [facility] ENDOSCOPY;  Service: Pulmonary;  Laterality: Right;  w/ Talc Pleurodesis, planned admit  for Obs afterwards   CHEST TUBE INSERTION Right 04/28/2022   Procedure: INSERTION PLEURAL DRAINAGE CATHETER - Pigtail tail, drainage;  Surgeon: Candee Furbish, MD;  Location: [facility] ENDOSCOPY;  Service: Pulmonary;  Laterality: Right;  talc pleurodesis   CHEST TUBE INSERTION Right 09/01/2022   Procedure: INSERTION PLEURAL DRAINAGE CATHETER;  Surgeon: Juanito Doom, MD;  Location: [place];  Service: Cardiopulmonary;  Laterality: Right;  pigtail catheter placement with talc pleurodesis   COLONOSCOPY WITH PROPOFOL N/A 12/17/2018   Procedure: COLONOSCOPY WITH PROPOFOL;  Surgeon: Virgel Manifold, MD;  Location: [facility] ENDOSCOPY;  Service: Gastroenterology;  Laterality: N/A;   IR IMAGING GUIDED PORT INSERTION  04/17/2020   IR THORACENTESIS ASP PLEURAL SPACE W/IMG GUIDE  11/29/2021   IR THORACENTESIS ASP PLEURAL SPACE W/IMG GUIDE  01/03/2022   JOINT REPLACEMENT Bilateral    REPLACEMENT TOTAL KNEE BILATERAL  2015   TALC PLEURODESIS  09/01/2022   Procedure: Pietro Cassis;  Surgeon: Juanito Doom, MD;  Location: [facility] ENDOSCOPY;  Service: Cardiopulmonary;;   [place] ECHOCARDIOGRAM  05/2020   a) 05/2020: EF 55 to 60%.  No R WMA.  Mild LVH.  Indeterminate LVEDP.  Unable to assess RVP.  Normal aortic and mitral valves.  Mildly elevated RAP.; b) 09/2021: EF 50-55%. No RWMA. Mild LVH. ~ LVEDP. Mild LA Dilation. NORMAL RV/RAP.  Normal MV/AoV.   Patient Active Problem  List   Diagnosis Date Noted   Hip pain 12/09/2022   Dizziness 11/12/2022   Chronic respiratory failure with hypoxia 09/18/2022   Pneumothorax 09/02/2022   Pneumothorax on right 09/01/2022   Recurrent pleural effusion 08/27/2022   Recurrent pleural effusion on right 01/16/2022   Preop cardiovascular exam 12/19/2021   Coronary Calcium Score 657 11/13/2021   Atypical chest pain 11/13/2021   Pleural effusion 10/29/2021   Acute on chronic respiratory failure with hypoxia 10/12/2021   Recurrent right  pleural effusion 08/08/2021   Erectile dysfunction 10/26/2020   Port-A-Cath in place 06/11/2020   History of pulmonary embolism 05/15/2020   Small cell lung cancer, right upper lobe 04/12/2020   Chronic fatigue 04/12/2020   Encounter for antineoplastic chemotherapy 04/12/2020   Encounter for antineoplastic immunotherapy 04/12/2020   Malignant neoplasm of upper lobe of right lung 04/04/2020   Goals of care, counseling/discussion 03/30/2020   Exertional dyspnea 03/26/2020   Hyperlipidemia associated with type 2 diabetes mellitus 11/08/2019   Joint swelling 11/16/2018   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   Essential hypertension 09/09/2017   Type 2 diabetes mellitus 09/09/2017   GERD (gastroesophageal reflux disease) 09/09/2017   Testosterone deficiency 09/09/2017   OSA (obstructive sleep apnea) 09/09/2017   Chronic bilateral low back pain without sciatica 09/09/2017   H/O knee surgery 04/27/2014   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 Pain only a 3-4/10 NPS in lower back. No stumbles or falls.    PERTINENT HISTORY:  Patient is a very pleasant 72 year old male presenting to physical therapy for chronic low back pain without sciatica. Patient has been seen by this therapist in the past. Patient additionally has relapsed extensive stage small cell lung cancer and now multiple metastatic liver lesions  diagnosed in April 2021 with history of radiotherapy and chemotherapy. PMH includes chickenpox COPD, CAD, HTN, GERD, OSA, type II DM, bilateral TKA. Patient tried figure four and stomach pressing up. Also having vertigo.   PAIN:  Are you having pain? 7/10 pain Yes: NPRS scale: worst pain: 10/10; least  amount of pain 4/10 current pain 6-7/10 Pain location: R sided low back pain radiating to back of knee; sometimes feeling like wet pants of adduction. Pain description: radiating to back pain Aggravating factors: getting up,  Relieving factors: laying down in bed, bending over the sink  PRECAUTIONS: Other: back, cancer   WEIGHT BEARING RESTRICTIONS: No  FALLS:  Has patient fallen in last 6 months? No  LIVING ENVIRONMENT: Lives with: lives with their spouse Lives in: House/apartment Stairs: Yes: Internal: flight steps; on right going up and on left going up and External: 5 steps; on right going up and on left going up Has following equipment at home: Single point cane, Environmental consultant - 4 wheeled, Electronics engineer, and Grab bars  OCCUPATION: retired IT trainer  PLOF: Independent  PATIENT GOALS: to have less pain  NEXT MD VISIT: n/a  OBJECTIVE:   DIAGNOSTIC FINDINGS:  MR lumbar 2020 Advanced disc degeneration T12-L1 with mild spinal stenosis   Mild spinal stenosis L2-3   Moderate spinal stenosis L3-4 and L4-5  PATIENT SURVEYS:  FOTO 36  SCREENING FOR RED FLAGS: Bowel or bladder incontinence: No Spinal tumors: No Cauda equina syndrome: No Compression fracture: No Abdominal aneurysm: No  COGNITION: Overall cognitive status: Within functional limits for tasks assessed     SENSATION: Hot/Cold: Impaired R medial knee  MUSCLE LENGTH: Hamstrings: Right 25 deg; Left 40 deg   POSTURE: rounded shoulders, increased thoracic kyphosis, posterior pelvic tilt, and flexed trunk   PALPATION: Excessive muscle guarding along lumbar paraspinals and piriformis   LUMBAR ROM:   AROM eval   Flexion limited  Extension limited  Right lateral flexion limited  Left lateral flexion limited  Right rotation limited  Left rotation limited   (Blank rows = not tested)    LOWER EXTREMITY MMT:    MMT Right eval Left eval  Hip flexion 2+* 3  Hip extension    Hip abduction 3* 3  Hip adduction 3-* 3  Knee flexion 3-* 3  Knee extension 3 3  Ankle dorsiflexion 4 4  Ankle plantarflexion 4 4   (Blank rows = not tested)  LUMBAR SPECIAL TESTS:  Straight leg raise test: Positive, SI Compression/distraction test: Positive, FABER test: Negative, and scour negative; distraction positive   FUNCTIONAL TESTS:  5 times sit to stand: unable to perform due to pain 10 meter walk test: unable to perform due to pain Sit to stand: CGA due to extreme pain Supine,<>prone; slow, extra time due to pain  GAIT: Distance walked: 3 ft Assistive device utilized: Single point cane Level of assistance: CGA Comments: severe pain with walking  TODAY'S TREATMENT:                                                                                                                              DATE: 03/19/2023  There.ex:   Hook lying with bolster:    SAQ: 3x8/LE    Hip flexion in hook lying: 2x6  Quad sets: x10/LE   Clam shells: RTB, 2x10    Bridges: 2x8     IASTM to R ITB, TFL and glut max for pain modulation in L sidelying: 5 minutes    Seated:    LAQ's, RLE : x10, 3# AW's. 3x8 with 5# AW's.      PATIENT EDUCATION:  Education details: POC, anterior pelvic stretch Person educated: Patient and Spouse Education method: Explanation, Demonstration, Tactile cues, and Verbal cues Education comprehension: verbalized understanding, returned demonstration, verbal cues required, and tactile cues required  HOME EXERCISE PROGRAM: Anterior pelvic tilt 10x Hamstring stretch 30 seconds Lay prone 60 seconds   Access Code: [email or web address] URL: [email or web address] Date: 01/29/2023 Prepared by:  Janna Arch  Exercises - Seated Anterior Pelvic Tilt  - 1 x daily - 7 x weekly - 2 sets - 10 reps - 5 hold - Supine Anterior Pelvic Tilt  - 1 x daily - 7 x weekly - 2 sets - 10 reps - 5 hold - Seated Hamstring Stretch  - 1 x daily - 7 x weekly - 2 sets - 2 reps - 30 hold - Supine Transversus Abdominis Bracing - Hands on Ground  - 1 x daily - 7 x weekly - 2 sets - 10 reps - 5 hold  ASSESSMENT:  CLINICAL IMPRESSION: Continuing PT POC with focus on progression of strength program as previously discussed with primary PT. Pt with low pain levels today tolerating hook lying and seated therex without exacerbation. Pt understanding of all exercises displaying greater weakness on RLE versus LLE as proximal exercises are more effortful for him. Educated pt to update primary PT next session on tolerance for today's session to understand and plan for future PT sessions. Pt understanding. Pt will benefit from skilled PT services to address deficits in order to improve overall QoL and return to PLOF.      OBJECTIVE IMPAIRMENTS: Abnormal gait, cardiopulmonary status limiting activity, decreased activity tolerance, decreased balance, decreased endurance, decreased mobility, difficulty walking, decreased ROM, decreased strength, hypomobility, impaired perceived functional ability, increased muscle spasms, impaired flexibility, impaired sensation, improper body mechanics, postural dysfunction, obesity, and pain.   ACTIVITY LIMITATIONS: carrying, lifting, bending, sitting, standing, squatting, sleeping, stairs, transfers, bed mobility, bathing, toileting, dressing, reach over head, hygiene/grooming, and locomotion level  PARTICIPATION LIMITATIONS: meal prep, cleaning, laundry, medication management, personal finances, interpersonal relationship, driving, shopping, community activity, occupation, yard work, and church  PERSONAL FACTORS: Age, Behavior pattern, Education, Restaurant manager, fast food, Past/current experiences,  Profession, Sex, Social background, Time since onset of injury/illness/exacerbation, and 3+ comorbidities: chickenpox COPD, CAD, HTN, GERD, OSA, type II DM, bilateral TKA  are also affecting patient's functional outcome.   REHAB POTENTIAL: Good  CLINICAL DECISION MAKING: Evolving/moderate complexity  EVALUATION COMPLEXITY: Moderate   GOALS: Goals reviewed with patient? Yes  SHORT TERM GOALS: Target date: 04/13/2023    Patient will be independent in home exercise program to improve strength/mobility for better functional independence with ADLs. Baseline: 2/12: given to patient 4/1: compliant Goal status: MET  2.  .  Patient will report a worst pain of  5/10 on VAS in low back to improve tolerance with ADLs and reduced symptoms with activities. Baseline: 2/12: 10/10 4/1: 6/10  Goal status: Partially Met    LONG TERM GOALS: Target date: 05/11/2023    Patient will increase FOTO score to equal to or greater than 48%    to demonstrate statistically significant improvement in mobility and quality of life.  Baseline: 2/12: 36% 4/1: 37% Goal status: Partially Met  2.  Patient will report a worst pain of 3/10 on VAS in low back to improve tolerance with ADLs and reduced symptoms with activities.  Baseline: 2/12: 10/10 4/1: 6/10  Goal status: Partially Met  3.  Patient will increase 10 meter walk test to >1.89m/s as to improve gait speed for better community ambulation and to reduce fall risk Baseline: 2/12: unable to ambulate 10 meters 4/1: 17.98 seconds with SPC  Goal status: Partially Met   4.  Patient (> 73 years old) will complete five times sit to stand test in < 15 seconds indicating an increased LE strength and improved balance. Baseline: 2/12: unable to perform due to severe pain 4/1: 21 seconds with BUE support Goal status: Partially Met    PLAN:  PT FREQUENCY: 2x/week  PT DURATION: 8 weeks  PLANNED INTERVENTIONS: Therapeutic exercises, Therapeutic activity,  Neuromuscular re-education, Balance training, Gait training, Patient/Family education, Self Care, Joint mobilization, Stair training, Vestibular training, Canalith repositioning, Orthotic/Fit training, DME instructions, Electrical stimulation, Spinal manipulation, Spinal mobilization, Cryotherapy, Moist heat, Splintting, Taping, Vasopneumatic device, Traction, Ultrasound, Manual therapy, and Re-evaluation.  PLAN FOR NEXT SESSION:  Reassess tolerance for LE strengthening  Salem Caster. Fairly IV, PT, DPT Physical Therapist- [facility]   03/19/23, 3:30 PM

## 2023-03-19 NOTE — Patient Instructions (Signed)
[facility]  Discharge Instructions: Thank you for choosing [facility] to provide your oncology and hematology care.   If you have a lab appointment with the Cancer Center, please go directly to the Cancer Center and check in at the registration area.   Wear comfortable clothing and clothing appropriate for easy access to any Portacath or PICC line.   We strive to give you quality time with your provider. You may need to reschedule your appointment if you arrive late (15 or more minutes).  Arriving late affects you and other patients whose appointments are after yours.  Also, if you miss three or more appointments without notifying the office, you may be dismissed from the clinic at the provider's discretion.      For prescription refill requests, have your pharmacy contact our office and allow 72 hours for refills to be completed.    Today you received the following chemotherapy and/or immunotherapy agents cosela, etoposide      To help prevent nausea and vomiting after your treatment, we encourage you to take your nausea medication as directed.  BELOW ARE SYMPTOMS THAT SHOULD BE REPORTED IMMEDIATELY: *FEVER GREATER THAN 100.4 F (38 C) OR HIGHER *CHILLS OR SWEATING *NAUSEA AND VOMITING THAT IS NOT CONTROLLED WITH YOUR NAUSEA MEDICATION *UNUSUAL SHORTNESS OF BREATH *UNUSUAL BRUISING OR BLEEDING *URINARY PROBLEMS (pain or burning when urinating, or frequent urination) *BOWEL PROBLEMS (unusual diarrhea, constipation, pain near the anus) TENDERNESS IN MOUTH AND THROAT WITH OR WITHOUT PRESENCE OF ULCERS (sore throat, sores in mouth, or a toothache) UNUSUAL RASH, SWELLING OR PAIN  UNUSUAL VAGINAL DISCHARGE OR ITCHING   Items with * indicate a potential emergency and should be followed up as soon as possible or go to the Emergency Department if any problems should occur.  Please show the CHEMOTHERAPY ALERT CARD or IMMUNOTHERAPY ALERT CARD  at check-in to the Emergency Department and triage nurse.  Should you have questions after your visit or need to cancel or reschedule your appointment, please contact [facility]  Dept: [phone]  and follow the prompts.  Office hours are 8:00 a.m. to 4:30 p.m. Monday - Friday. Please note that voicemails left after 4:00 p.m. may not be returned until the following business day.  We are closed weekends and major holidays. You have access to a nurse at all times for urgent questions. Please call the main number to the clinic Dept: [phone] and follow the prompts.   For any non-urgent questions, you may also contact your provider using MyChart. We now offer e-Visits for anyone 18 and older to request care online for non-urgent symptoms. For details visit [email or web address].   Also download the MyChart app! Go to the app store, search "MyChart", open the app, select [facility], and log in with your MyChart username and password.  

## 2023-03-20 ENCOUNTER — Other Ambulatory Visit: Payer: Self-pay

## 2023-03-20 ENCOUNTER — Inpatient Hospital Stay: Payer: Medicare Other

## 2023-03-20 DIAGNOSIS — C3411 Malignant neoplasm of upper lobe, right bronchus or lung: Secondary | ICD-10-CM

## 2023-03-24 ENCOUNTER — Ambulatory Visit: Payer: Medicare Other

## 2023-03-24 DIAGNOSIS — M6281 Muscle weakness (generalized): Secondary | ICD-10-CM | POA: Diagnosis not present

## 2023-03-24 DIAGNOSIS — M5459 Other low back pain: Secondary | ICD-10-CM

## 2023-03-24 DIAGNOSIS — R262 Difficulty in walking, not elsewhere classified: Secondary | ICD-10-CM | POA: Diagnosis not present

## 2023-03-24 NOTE — Therapy (Signed)
OUTPATIENT PHYSICAL THERAPY THORACOLUMBAR TREATMENT   Patient Name: Adam Wise MRN: [id number] DOB:[birth year], 72 y.o., male Today's Date: 03/24/2023  END OF SESSION:  PT End of Session - 03/24/23 1341     Visit Number 12    Number of Visits 26    Date for PT Re-Evaluation 05/11/23    PT Start Time 1345    PT Stop Time 1429    PT Time Calculation (min) 44 min    Activity Tolerance Patient tolerated treatment well;No increased pain    Behavior During Therapy [facility] for tasks assessed/performed                      Past Medical History:  Diagnosis Date   Chickenpox    Chronic knee pain    Chronic low back pain    COPD exacerbation 10/11/2021   COPD with exacerbation 10/12/2021   Coronary artery calcification seen on CAT scan 11/2021   Coronary CTA 11/27/2021: Moderate to large right pleural effusion and compressive atelectasis right lung base. => Coronary Calcium Score 657.  Diffuse RCA calcification.  Minimal mild disease in the LAD and diagonal branches. == Overall limited study.  Notable artifact.   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 He has disease progression after the first line of chemotherapy in December 2021.   Testosterone deficiency    Type 2 diabetes mellitus    Past Surgical History:  Procedure Laterality Date   CHEST TUBE INSERTION Right 01/20/2022   Procedure: CHEST TUBE INSERTION;  Surgeon: Lorin Glass, MD;  Location: [facility] ENDOSCOPY;  Service: Pulmonary;  Laterality: Right;  w/ Talc Pleurodesis, planned admit for Obs afterwards   CHEST TUBE  INSERTION Right 04/28/2022   Procedure: INSERTION PLEURAL DRAINAGE CATHETER - Pigtail tail, drainage;  Surgeon: Lorin Glass, MD;  Location: [facility] ENDOSCOPY;  Service: Pulmonary;  Laterality: Right;  talc pleurodesis   CHEST TUBE INSERTION Right 09/01/2022   Procedure: INSERTION PLEURAL DRAINAGE CATHETER;  Surgeon: Lupita Leash, MD;  Location: [facility] ENDOSCOPY;  Service: Cardiopulmonary;  Laterality: Right;  pigtail catheter placement with talc pleurodesis   COLONOSCOPY WITH PROPOFOL N/A 12/17/2018   Procedure: COLONOSCOPY WITH PROPOFOL;  Surgeon: Pasty Spillers, MD;  Location: [facility] ENDOSCOPY;  Service: Gastroenterology;  Laterality: N/A;   IR IMAGING GUIDED PORT INSERTION  04/17/2020   IR THORACENTESIS ASP PLEURAL SPACE W/IMG GUIDE  11/29/2021   IR THORACENTESIS ASP PLEURAL SPACE W/IMG GUIDE  01/03/2022   JOINT REPLACEMENT Bilateral    REPLACEMENT TOTAL KNEE BILATERAL  2015   TALC PLEURODESIS  09/01/2022   Procedure: Lurlean Nanny;  Surgeon: Lupita Leash, MD;  Location: [facility] ENDOSCOPY;  Service: Cardiopulmonary;;   TONSILLECTOMY  1960   TRANSTHORACIC ECHOCARDIOGRAM  05/2020   a) 05/2020: EF 55 to 60%.  No R WMA.  Mild LVH.  Indeterminate LVEDP.  Unable to assess RVP.  Normal aortic and mitral valves.  Mildly elevated RAP.; b) 09/2021: EF 50-55%. No RWMA. Mild LVH. ~ LVEDP. Mild LA Dilation. NORMAL RV/RAP.  Normal MV/AoV.   Patient Active Problem List   Diagnosis Date Noted  Hip pain 12/09/2022   Dizziness 11/12/2022   Chronic respiratory failure with hypoxia 09/18/2022   Pneumothorax 09/02/2022   Pneumothorax on right 09/01/2022   Recurrent pleural effusion 08/27/2022   Recurrent pleural effusion on right 01/16/2022   Preop cardiovascular exam 12/19/2021   Coronary Calcium Score 657 11/13/2021   Atypical chest pain 11/13/2021   Pleural effusion 10/29/2021   Acute on chronic respiratory failure with hypoxia 10/12/2021   Recurrent right pleural effusion 08/08/2021   Erectile  dysfunction 10/26/2020   Port-A-Cath in place 06/11/2020   History of pulmonary embolism 05/15/2020   Small cell lung cancer, right upper lobe 04/12/2020   Chronic fatigue 04/12/2020   Encounter for antineoplastic chemotherapy 04/12/2020   Encounter for antineoplastic immunotherapy 04/12/2020   Malignant neoplasm of upper lobe of right lung 04/04/2020   Goals of care, counseling/discussion 03/30/2020   Exertional dyspnea 03/26/2020   Hyperlipidemia associated with type 2 diabetes mellitus 11/08/2019   Joint swelling 11/16/2018   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   Essential hypertension 09/09/2017   Type 2 diabetes mellitus 09/09/2017   GERD (gastroesophageal reflux disease) 09/09/2017   Testosterone deficiency 09/09/2017   OSA (obstructive sleep apnea) 09/09/2017   Chronic bilateral low back pain without sciatica 09/09/2017   H/O knee surgery 04/27/2014   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    PCP: Doreene Nest NP   REFERRING PROVIDER: Doreene Nest NP   REFERRING DIAG: chronic bilateral low back pain without sciatica   Rationale for Evaluation and Treatment: Rehabilitation  THERAPY DIAG:  Other low back pain  Difficulty in walking, not elsewhere classified  Muscle weakness (generalized)  ONSET DATE: December 2023  SUBJECTIVE:                                                                                                                                                                                           SUBJECTIVE STATEMENT: Patient reports he paid for the exercises for two days .Reports his pain is a 5/10 today, yesterday was a 7-8/10.    PERTINENT HISTORY:  Patient is a very pleasant 72 year old male presenting to physical therapy for chronic low back pain without sciatica. Patient has been seen by this therapist in the past. Patient additionally has relapsed extensive stage small cell lung cancer and now multiple metastatic liver lesions diagnosed in  April 2021 with history of radiotherapy and chemotherapy. PMH includes chickenpox COPD, CAD, HTN, GERD, OSA, type II DM, bilateral TKA. Patient tried figure four and stomach pressing up. Also having vertigo.   PAIN:  Are you having pain? 7/10 pain Yes: NPRS scale: worst pain: 10/10; least amount of pain  4/10 current pain 6-7/10 Pain location: R sided low back pain radiating to back of knee; sometimes feeling like wet pants of adduction. Pain description: radiating to back pain Aggravating factors: getting up,  Relieving factors: laying down in bed, bending over the sink  PRECAUTIONS: Other: back, cancer   WEIGHT BEARING RESTRICTIONS: No  FALLS:  Has patient fallen in last 6 months? No  LIVING ENVIRONMENT: Lives with: lives with their spouse Lives in: House/apartment Stairs: Yes: Internal: flight steps; on right going up and on left going up and External: 5 steps; on right going up and on left going up Has following equipment at home: Single point cane, Environmental consultant - 4 wheeled, Tour manager, and Grab bars  OCCUPATION: retired Theatre stage manager  PLOF: Independent  PATIENT GOALS: to have less pain  NEXT MD VISIT: n/a  OBJECTIVE:   DIAGNOSTIC FINDINGS:  MR lumbar 2020 Advanced disc degeneration T12-L1 with mild spinal stenosis   Mild spinal stenosis L2-3   Moderate spinal stenosis L3-4 and L4-5  PATIENT SURVEYS:  FOTO 36  SCREENING FOR RED FLAGS: Bowel or bladder incontinence: No Spinal tumors: No Cauda equina syndrome: No Compression fracture: No Abdominal aneurysm: No  COGNITION: Overall cognitive status: Within functional limits for tasks assessed     SENSATION: Hot/Cold: Impaired R medial knee  MUSCLE LENGTH: Hamstrings: Right 25 deg; Left 40 deg   POSTURE: rounded shoulders, increased thoracic kyphosis, posterior pelvic tilt, and flexed trunk   PALPATION: Excessive muscle guarding along lumbar paraspinals and piriformis   LUMBAR ROM:   AROM eval  Flexion  limited  Extension limited  Right lateral flexion limited  Left lateral flexion limited  Right rotation limited  Left rotation limited   (Blank rows = not tested)    LOWER EXTREMITY MMT:    MMT Right eval Left eval  Hip flexion 2+* 3  Hip extension    Hip abduction 3* 3  Hip adduction 3-* 3  Knee flexion 3-* 3  Knee extension 3 3  Ankle dorsiflexion 4 4  Ankle plantarflexion 4 4   (Blank rows = not tested)  LUMBAR SPECIAL TESTS:  Straight leg raise test: Positive, SI Compression/distraction test: Positive, FABER test: Negative, and scour negative; distraction positive   FUNCTIONAL TESTS:  5 times sit to stand: unable to perform due to pain 10 meter walk test: unable to perform due to pain Sit to stand: CGA due to extreme pain Supine,<>prone; slow, extra time due to pain  GAIT: Distance walked: 3 ft Assistive device utilized: Single point cane Level of assistance: CGA Comments: severe pain with walking  TODAY'S TREATMENT:                                                                                                                              DATE: 03/19/2023  There.ex:   Hook lying with bolster:    SAQ: 2x10/LE    Modified hip flexion/heel slide 8x each LE; x2 sets  Quad sets: x10/LE   Supine Clam shells: RTB, 2x10           Seated: Adduction ball squeeze 10x Heel toe raise 15x   Manual: Hamstring stretch 60 seconds each LE IT band stretch 60 seconds each LE  Single knee to chest 60 seconds each LE IASTM to R ITB, TFL and glut max for pain modulation in L sidelying: 5 minutes  PATIENT EDUCATION:  Education details: POC, anterior pelvic stretch Person educated: Patient and Spouse Education method: Explanation, Demonstration, Tactile cues, and Verbal cues Education comprehension: verbalized understanding, returned demonstration, verbal cues required, and tactile cues required  HOME EXERCISE PROGRAM: Anterior pelvic tilt 10x Hamstring stretch 30  seconds Lay prone 60 seconds   Access Code: [email or web address] URL: [email or web address] Date: 01/29/2023 Prepared by: Precious Bard  Exercises - Seated Anterior Pelvic Tilt  - 1 x daily - 7 x weekly - 2 sets - 10 reps - 5 hold - Supine Anterior Pelvic Tilt  - 1 x daily - 7 x weekly - 2 sets - 10 reps - 5 hold - Seated Hamstring Stretch  - 1 x daily - 7 x weekly - 2 sets - 2 reps - 30 hold - Supine Transversus Abdominis Bracing - Hands on Ground  - 1 x daily - 7 x weekly - 2 sets - 10 reps - 5 hold  ASSESSMENT:  CLINICAL IMPRESSION: Patient tolerates gentle strengthening interventions. Slight regression from previous week performed due to pain for multiple days after previous session. Patient eager to progress his painfree mobility at this time.  R IT band is tight and relieved with roller stick. Pt will benefit from skilled PT services to address deficits in order to improve overall QoL and return to PLOF.      OBJECTIVE IMPAIRMENTS: Abnormal gait, cardiopulmonary status limiting activity, decreased activity tolerance, decreased balance, decreased endurance, decreased mobility, difficulty walking, decreased ROM, decreased strength, hypomobility, impaired perceived functional ability, increased muscle spasms, impaired flexibility, impaired sensation, improper body mechanics, postural dysfunction, obesity, and pain.   ACTIVITY LIMITATIONS: carrying, lifting, bending, sitting, standing, squatting, sleeping, stairs, transfers, bed mobility, bathing, toileting, dressing, reach over head, hygiene/grooming, and locomotion level  PARTICIPATION LIMITATIONS: meal prep, cleaning, laundry, medication management, personal finances, interpersonal relationship, driving, shopping, community activity, occupation, yard work, and church  PERSONAL FACTORS: Age, Behavior pattern, Education, Financial risk analyst, Past/current experiences, Profession, Sex, Social background, Time since onset of  injury/illness/exacerbation, and 3+ comorbidities: chickenpox COPD, CAD, HTN, GERD, OSA, type II DM, bilateral TKA  are also affecting patient's functional outcome.   REHAB POTENTIAL: Good  CLINICAL DECISION MAKING: Evolving/moderate complexity  EVALUATION COMPLEXITY: Moderate   GOALS: Goals reviewed with patient? Yes  SHORT TERM GOALS: Target date: 04/13/2023    Patient will be independent in home exercise program to improve strength/mobility for better functional independence with ADLs. Baseline: 2/12: given to patient 4/1: compliant Goal status: MET  2.  .  Patient will report a worst pain of  5/10 on VAS in low back to improve tolerance with ADLs and reduced symptoms with activities. Baseline: 2/12: 10/10 4/1: 6/10  Goal status: Partially Met    LONG TERM GOALS: Target date: 05/11/2023    Patient will increase FOTO score to equal to or greater than 48%    to demonstrate statistically significant improvement in mobility and quality of life.  Baseline: 2/12: 36% 4/1: 37% Goal status: Partially Met  2.  Patient will report a worst pain of 3/10 on VAS in low back to  improve tolerance with ADLs and reduced symptoms with activities.  Baseline: 2/12: 10/10 4/1: 6/10  Goal status: Partially Met  3.  Patient will increase 10 meter walk test to >1.8771m/s as to improve gait speed for better community ambulation and to reduce fall risk Baseline: 2/12: unable to ambulate 10 meters 4/1: 17.98 seconds with SPC  Goal status: Partially Met   4.  Patient (> 73 years old) will complete five times sit to stand test in < 15 seconds indicating an increased LE strength and improved balance. Baseline: 2/12: unable to perform due to severe pain 4/1: 21 seconds with BUE support Goal status: Partially Met    PLAN:  PT FREQUENCY: 2x/week  PT DURATION: 8 weeks  PLANNED INTERVENTIONS: Therapeutic exercises, Therapeutic activity, Neuromuscular re-education, Balance training, Gait training,  Patient/Family education, Self Care, Joint mobilization, Stair training, Vestibular training, Canalith repositioning, Orthotic/Fit training, DME instructions, Electrical stimulation, Spinal manipulation, Spinal mobilization, Cryotherapy, Moist heat, Splintting, Taping, Vasopneumatic device, Traction, Ultrasound, Manual therapy, and Re-evaluation.  PLAN FOR NEXT SESSION:  Reassess tolerance for LE strengthening  Precious BardMarina Arriah Wadle PT  Physical Therapist- [facility]  [facility]   03/24/23, 2:32 PM

## 2023-03-25 ENCOUNTER — Inpatient Hospital Stay: Payer: Medicare Other

## 2023-03-25 ENCOUNTER — Other Ambulatory Visit: Payer: Self-pay | Admitting: Primary Care

## 2023-03-25 DIAGNOSIS — Z79899 Other long term (current) drug therapy: Secondary | ICD-10-CM | POA: Diagnosis not present

## 2023-03-25 DIAGNOSIS — Z5189 Encounter for other specified aftercare: Secondary | ICD-10-CM | POA: Diagnosis not present

## 2023-03-25 DIAGNOSIS — D61810 Antineoplastic chemotherapy induced pancytopenia: Secondary | ICD-10-CM | POA: Diagnosis not present

## 2023-03-25 DIAGNOSIS — Z5111 Encounter for antineoplastic chemotherapy: Secondary | ICD-10-CM | POA: Diagnosis not present

## 2023-03-25 DIAGNOSIS — E236 Other disorders of pituitary gland: Secondary | ICD-10-CM | POA: Diagnosis not present

## 2023-03-25 DIAGNOSIS — C787 Secondary malignant neoplasm of liver and intrahepatic bile duct: Secondary | ICD-10-CM | POA: Diagnosis not present

## 2023-03-25 DIAGNOSIS — Z95828 Presence of other vascular implants and grafts: Secondary | ICD-10-CM

## 2023-03-25 DIAGNOSIS — C3411 Malignant neoplasm of upper lobe, right bronchus or lung: Secondary | ICD-10-CM | POA: Diagnosis not present

## 2023-03-25 DIAGNOSIS — R7989 Other specified abnormal findings of blood chemistry: Secondary | ICD-10-CM | POA: Diagnosis not present

## 2023-03-25 DIAGNOSIS — E119 Type 2 diabetes mellitus without complications: Secondary | ICD-10-CM

## 2023-03-25 DIAGNOSIS — Z452 Encounter for adjustment and management of vascular access device: Secondary | ICD-10-CM | POA: Diagnosis not present

## 2023-03-25 DIAGNOSIS — T451X5A Adverse effect of antineoplastic and immunosuppressive drugs, initial encounter: Secondary | ICD-10-CM

## 2023-03-25 LAB — CBC WITH DIFFERENTIAL (CANCER CENTER ONLY)
Abs Immature Granulocytes: 0.13 10*3/uL — ABNORMAL HIGH (ref 0.00–0.07)
Basophils Absolute: 0 10*3/uL (ref 0.0–0.1)
Basophils Relative: 0 %
Eosinophils Absolute: 0 10*3/uL (ref 0.0–0.5)
Eosinophils Relative: 1 %
HCT: 25.7 % — ABNORMAL LOW (ref 39.0–52.0)
Hemoglobin: 9 g/dL — ABNORMAL LOW (ref 13.0–17.0)
Immature Granulocytes: 3 %
Lymphocytes Relative: 9 %
Lymphs Abs: 0.4 10*3/uL — ABNORMAL LOW (ref 0.7–4.0)
MCH: 33.5 pg (ref 26.0–34.0)
MCHC: 35 g/dL (ref 30.0–36.0)
MCV: 95.5 fL (ref 80.0–100.0)
Monocytes Absolute: 0.8 10*3/uL (ref 0.1–1.0)
Monocytes Relative: 17 %
Neutro Abs: 3.4 10*3/uL (ref 1.7–7.7)
Neutrophils Relative %: 70 %
Platelet Count: 50 10*3/uL — ABNORMAL LOW (ref 150–400)
RBC: 2.69 MIL/uL — ABNORMAL LOW (ref 4.22–5.81)
RDW: 17.6 % — ABNORMAL HIGH (ref 11.5–15.5)
Smear Review: NORMAL
WBC Count: 4.8 10*3/uL (ref 4.0–10.5)
nRBC: 0 % (ref 0.0–0.2)

## 2023-03-25 LAB — CMP (CANCER CENTER ONLY)
ALT: 23 U/L (ref 0–44)
AST: 20 U/L (ref 15–41)
Albumin: 3.9 g/dL (ref 3.5–5.0)
Alkaline Phosphatase: 104 U/L (ref 38–126)
Anion gap: 8 (ref 5–15)
BUN: 24 mg/dL — ABNORMAL HIGH (ref 8–23)
CO2: 29 mmol/L (ref 22–32)
Calcium: 9 mg/dL (ref 8.9–10.3)
Chloride: 101 mmol/L (ref 98–111)
Creatinine: 0.98 mg/dL (ref 0.61–1.24)
GFR, Estimated: >60 mL/min (ref >60)
Glucose, Bld: 177 mg/dL — ABNORMAL HIGH (ref 70–99)
Potassium: 4.3 mmol/L (ref 3.5–5.1)
Sodium: 138 mmol/L (ref 135–145)
Total Bilirubin: 0.5 mg/dL (ref 0.3–1.2)
Total Protein: 6.5 g/dL (ref 6.5–8.1)

## 2023-03-25 MED ORDER — HEPARIN SOD (PORK) LOCK FLUSH 100 UNIT/ML IV SOLN
500.0000 [IU] | Freq: Once | INTRAVENOUS | Status: AC
  Administered 2023-03-25: 500 [IU]

## 2023-03-25 MED ORDER — SODIUM CHLORIDE 0.9% FLUSH
10.0000 mL | Freq: Once | INTRAVENOUS | Status: AC
  Administered 2023-03-25: 10 mL

## 2023-03-25 NOTE — Therapy (Signed)
OUTPATIENT PHYSICAL THERAPY THORACOLUMBAR TREATMENT   Patient Name: Adam CanningSteven Skirvin MRN: [id number] DOB:08/14/1951, 72 y.o., male Today's Date: 03/26/2023  END OF SESSION:  PT End of Session - 03/26/23 1511     Visit Number 13    Number of Visits 26    Date for PT Re-Evaluation 05/11/23    PT Start Time 1514    PT Stop Time 1558    PT Time Calculation (min) 44 min    Activity Tolerance Patient tolerated treatment well;No increased pain    [facility] tasks assessed/performed                       Past Medical History:  Diagnosis Date   Chickenpox    Chronic knee pain    Chronic low back pain    COPD exacerbation 10/11/2021   COPD with exacerbation 10/12/2021   Coronary artery calcification seen on CAT scan 11/2021   Coronary CTA 11/27/2021: Moderate to large right pleural effusion and compressive atelectasis right lung base. => Coronary Calcium Score 657.  Diffuse RCA calcification.  Minimal mild disease in the LAD and diagonal branches. == Overall limited study.  Notable artifact.   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 He has disease progression after the first line of chemotherapy in December 2021.   Testosterone deficiency    Type 2 diabetes mellitus    Past Surgical History:  Procedure Laterality Date   CHEST TUBE INSERTION Right 01/20/2022   Procedure: CHEST TUBE INSERTION;  Surgeon: Lorin GlassSmith, Daniel C, MD;  [facility];  Service: Pulmonary;  Laterality: Right;  w/ Talc Pleurodesis, planned admit for Obs afterwards   CHEST TUBE  INSERTION Right 04/28/2022   Procedure: INSERTION PLEURAL DRAINAGE CATHETER - Pigtail tail, drainage;  Surgeon: Lorin GlassSmith, Daniel C, MD;  [facility];  Service: Pulmonary;  Laterality: Right;  talc pleurodesis   CHEST TUBE INSERTION Right 09/01/2022   Procedure: INSERTION PLEURAL DRAINAGE CATHETER;  Surgeon: Lupita LeashMcQuaid, Douglas B, MD;  [facility];  Service: Cardiopulmonary;  Laterality: Right;  pigtail catheter placement with talc pleurodesis   COLONOSCOPY WITH PROPOFOL N/A 12/17/2018   Procedure: COLONOSCOPY WITH PROPOFOL;  Surgeon: Pasty Spillersahiliani, Varnita B, MD;  Location: [facility] ENDOSCOPY;  Service: Gastroenterology;  Laterality: N/A;   IR IMAGING GUIDED PORT INSERTION  04/17/2020   IR THORACENTESIS ASP PLEURAL SPACE W/IMG GUIDE  11/29/2021   IR THORACENTESIS ASP PLEURAL SPACE W/IMG GUIDE  01/03/2022   JOINT REPLACEMENT Bilateral    REPLACEMENT TOTAL KNEE BILATERAL  2015   TALC PLEURODESIS  09/01/2022   Procedure: [facility] NannyALC PLEURADESIS;  Surgeon: Lupita LeashMcQuaid, Douglas B, MD;  [facility];  Service: Cardiopulmonary;;   TONSILLECTOMY  1960   TRANSTHORACIC ECHOCARDIOGRAM  05/2020   a) 05/2020: EF 55 to 60%.  No R WMA.  Mild LVH.  Indeterminate LVEDP.  Unable to assess RVP.  Normal aortic and mitral valves.  Mildly elevated RAP.; b) 09/2021: EF 50-55%. No RWMA. Mild LVH. ~ LVEDP. Mild LA Dilation. NORMAL RV/RAP.  Normal MV/AoV.   Patient Active Problem List   Diagnosis Date Noted  Hip pain 12/09/2022   Dizziness 11/12/2022   Chronic respiratory failure with hypoxia 09/18/2022   Pneumothorax 09/02/2022   Pneumothorax on right 09/01/2022   Recurrent pleural effusion 08/27/2022   Recurrent pleural effusion on right 01/16/2022   Preop cardiovascular exam 12/19/2021   Coronary Calcium Score 657 11/13/2021   Atypical chest pain 11/13/2021   Pleural effusion 10/29/2021   Acute on chronic respiratory failure with hypoxia 10/12/2021   Recurrent right pleural effusion 08/08/2021   Erectile  dysfunction 10/26/2020   Port-A-Cath in place 06/11/2020   History of pulmonary embolism 05/15/2020   Small cell lung cancer, right upper lobe 04/12/2020   Chronic fatigue 04/12/2020   Encounter for antineoplastic chemotherapy 04/12/2020   Encounter for antineoplastic immunotherapy 04/12/2020   Malignant neoplasm of upper lobe of right lung 04/04/2020   Goals of care, counseling/discussion 03/30/2020   Exertional dyspnea 03/26/2020   Hyperlipidemia associated with type 2 diabetes mellitus 11/08/2019   Joint swelling 11/16/2018   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   Essential hypertension 09/09/2017   Type 2 diabetes mellitus 09/09/2017   GERD (gastroesophageal reflux disease) 09/09/2017   Testosterone deficiency 09/09/2017   OSA (obstructive sleep apnea) 09/09/2017   Chronic bilateral low back pain without sciatica 09/09/2017   H/O knee surgery 04/27/2014   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    PCP: Doreene Nest NP   REFERRING PROVIDER: Doreene Nest NP   REFERRING DIAG: chronic bilateral low back pain without sciatica   Rationale for Evaluation and Treatment: Rehabilitation  THERAPY DIAG:  Other low back pain  Difficulty in walking, not elsewhere classified  Muscle weakness (generalized)  ONSET DATE: December 2023  SUBJECTIVE:                                                                                                                                                                                           SUBJECTIVE STATEMENT: Patient had difficulty coming into session due to fire alarm. No falls or LOB since last session. Walked more since his last session. Now has a muscle relaxer prescribed at his doctor appointment this morning.    PERTINENT HISTORY:  Patient is a very pleasant 72 year old male presenting to physical therapy for chronic low back pain without sciatica. Patient has been seen by this therapist in the past. Patient additionally has relapsed  extensive stage small cell lung cancer and now multiple metastatic liver lesions diagnosed in April 2021 with history of radiotherapy and chemotherapy. PMH includes chickenpox COPD, CAD, HTN, GERD, OSA, type II DM, bilateral TKA. Patient tried figure four and stomach pressing up. Also having vertigo.   PAIN:  Are you  having pain? 7/10 pain Yes: NPRS scale: worst pain: 10/10; least amount of pain 4/10 current pain 6-7/10 Pain location: R sided low back pain radiating to back of knee; sometimes feeling like wet pants of adduction. Pain description: radiating to back pain Aggravating factors: getting up,  Relieving factors: laying down in bed, bending over the sink  PRECAUTIONS: Other: back, cancer   WEIGHT BEARING RESTRICTIONS: No  FALLS:  Has patient fallen in last 6 months? No  LIVING ENVIRONMENT: Lives with: lives with their spouse Lives in: House/apartment Stairs: Yes: Internal: flight steps; on right going up and on left going up and External: 5 steps; on right going up and on left going up Has following equipment at home: Single point cane, Environmental consultant - 4 wheeled, Tour manager, and Grab bars  OCCUPATION: retired Theatre stage manager  PLOF: Independent  PATIENT GOALS: to have less pain  NEXT MD VISIT: n/a  OBJECTIVE:   DIAGNOSTIC FINDINGS:  MR lumbar 2020 Advanced disc degeneration T12-L1 with mild spinal stenosis   Mild spinal stenosis L2-3   Moderate spinal stenosis L3-4 and L4-5  PATIENT SURVEYS:  FOTO 36  SCREENING FOR RED FLAGS: Bowel or bladder incontinence: No Spinal tumors: No Cauda equina syndrome: No Compression fracture: No Abdominal aneurysm: No  COGNITION: Overall cognitive status: Within functional limits for tasks assessed     SENSATION: Hot/Cold: Impaired R medial knee  MUSCLE LENGTH: Hamstrings: Right 25 deg; Left 40 deg   POSTURE: rounded shoulders, increased thoracic kyphosis, posterior pelvic tilt, and flexed trunk   PALPATION: Excessive  muscle guarding along lumbar paraspinals and piriformis   LUMBAR ROM:   AROM eval  Flexion limited  Extension limited  Right lateral flexion limited  Left lateral flexion limited  Right rotation limited  Left rotation limited   (Blank rows = not tested)    LOWER EXTREMITY MMT:    MMT Right eval Left eval  Hip flexion 2+* 3  Hip extension    Hip abduction 3* 3  Hip adduction 3-* 3  Knee flexion 3-* 3  Knee extension 3 3  Ankle dorsiflexion 4 4  Ankle plantarflexion 4 4   (Blank rows = not tested)  LUMBAR SPECIAL TESTS:  Straight leg raise test: Positive, SI Compression/distraction test: Positive, FABER test: Negative, and scour negative; distraction positive   FUNCTIONAL TESTS:  5 times sit to stand: unable to perform due to pain 10 meter walk test: unable to perform due to pain Sit to stand: CGA due to extreme pain Supine,<>prone; slow, extra time due to pain  GAIT: Distance walked: 3 ft Assistive device utilized: Single point cane Level of assistance: CGA Comments: severe pain with walking  TODAY'S TREATMENT:                                                                                                                              DATE: 03/19/2023  There.ex:  Hook lying with bolster:    SAQ: 2x10/LE ;  1.5 lb ankle weights -terminated due to spasm of R hip   RTB abduction 12x; 2 sets   Adduction ball squeeze 12x; 2 sets      Seated: Lateral step in/out 10x  Heel toe raise 15x   Manual: Hamstring stretch 60 seconds each LE IT band stretch 60 seconds each LE  Single knee to chest 60 seconds each LE Piriformis stretch 60 seconds each LE IASTM to bilateral ITB, TFL, quad for pain modulation  14 minutes  PATIENT EDUCATION:  Education details: POC, anterior pelvic stretch Person educated: Patient and Spouse Education method: Explanation, Demonstration, Tactile cues, and Verbal cues Education comprehension: verbalized understanding, returned  demonstration, verbal cues required, and tactile cues required  HOME EXERCISE PROGRAM: Anterior pelvic tilt 10x Hamstring stretch 30 seconds Lay prone 60 seconds   Access Code: [email or web address] URL: [email or web address] Date: 01/29/2023 Prepared by: Precious Bard  Exercises - Seated Anterior Pelvic Tilt  - 1 x daily - 7 x weekly - 2 sets - 10 reps - 5 hold - Supine Anterior Pelvic Tilt  - 1 x daily - 7 x weekly - 2 sets - 10 reps - 5 hold - Seated Hamstring Stretch  - 1 x daily - 7 x weekly - 2 sets - 2 reps - 30 hold - Supine Transversus Abdominis Bracing - Hands on Ground  - 1 x daily - 7 x weekly - 2 sets - 10 reps - 5 hold  ASSESSMENT:  CLINICAL IMPRESSION: Patient has increased spasms of R low back and glute with LAQ this session requiring modification of task. He remains highly motivated despite pain. Increased rolling performed for pain reduction/spasm reduction. Education on hydration and electrolytes performed.  Pt will benefit from skilled PT services to address deficits in order to improve overall QoL and return to PLOF.      OBJECTIVE IMPAIRMENTS: Abnormal gait, cardiopulmonary status limiting activity, decreased activity tolerance, decreased balance, decreased endurance, decreased mobility, difficulty walking, decreased ROM, decreased strength, hypomobility, impaired perceived functional ability, increased muscle spasms, impaired flexibility, impaired sensation, improper body mechanics, postural dysfunction, obesity, and pain.   ACTIVITY LIMITATIONS: carrying, lifting, bending, sitting, standing, squatting, sleeping, stairs, transfers, bed mobility, bathing, toileting, dressing, reach over head, hygiene/grooming, and locomotion level  PARTICIPATION LIMITATIONS: meal prep, cleaning, laundry, medication management, personal finances, interpersonal relationship, driving, shopping, community activity, occupation, yard work, and church  PERSONAL FACTORS: Age, Behavior  pattern, Education, Financial risk analyst, Past/current experiences, Profession, Sex, Social background, Time since onset of injury/illness/exacerbation, and 3+ comorbidities: chickenpox COPD, CAD, HTN, GERD, OSA, type II DM, bilateral TKA  are also affecting patient's functional outcome.   REHAB POTENTIAL: Good  CLINICAL DECISION MAKING: Evolving/moderate complexity  EVALUATION COMPLEXITY: Moderate   GOALS: Goals reviewed with patient? Yes  SHORT TERM GOALS: Target date: 04/13/2023    Patient will be independent in home exercise program to improve strength/mobility for better functional independence with ADLs. Baseline: 2/12: given to patient 4/1: compliant Goal status: MET  2.  .  Patient will report a worst pain of  5/10 on VAS in low back to improve tolerance with ADLs and reduced symptoms with activities. Baseline: 2/12: 10/10 4/1: 6/10  Goal status: Partially Met    LONG TERM GOALS: Target date: 05/11/2023    Patient will increase FOTO score to equal to or greater than 48%    to demonstrate statistically significant improvement in mobility and quality of life.  Baseline: 2/12: 36% 4/1: 37% Goal status: Partially Met  2.  Patient will report  a worst pain of 3/10 on VAS in low back to improve tolerance with ADLs and reduced symptoms with activities.  Baseline: 2/12: 10/10 4/1: 6/10  Goal status: Partially Met  3.  Patient will increase 10 meter walk test to >1.38m/s as to improve gait speed for better community ambulation and to reduce fall risk Baseline: 2/12: unable to ambulate 10 meters 4/1: 17.98 seconds with SPC  Goal status: Partially Met   4.  Patient (> 78 years old) will complete five times sit to stand test in < 15 seconds indicating an increased LE strength and improved balance. Baseline: 2/12: unable to perform due to severe pain 4/1: 21 seconds with BUE support Goal status: Partially Met    PLAN:  PT FREQUENCY: 2x/week  PT DURATION: 8 weeks  PLANNED  INTERVENTIONS: Therapeutic exercises, Therapeutic activity, Neuromuscular re-education, Balance training, Gait training, Patient/Family education, Self Care, Joint mobilization, Stair training, Vestibular training, Canalith repositioning, Orthotic/Fit training, DME instructions, Electrical stimulation, Spinal manipulation, Spinal mobilization, Cryotherapy, Moist heat, Splintting, Taping, Vasopneumatic device, Traction, Ultrasound, Manual therapy, and Re-evaluation.  PLAN FOR NEXT SESSION:  Reassess tolerance for LE strengthening  Precious Bard PT  Physical Therapist- [facility]   03/26/23, 4:01 PM

## 2023-03-26 ENCOUNTER — Ambulatory Visit (INDEPENDENT_AMBULATORY_CARE_PROVIDER_SITE_OTHER): Payer: Medicare Other | Admitting: Primary Care

## 2023-03-26 ENCOUNTER — Encounter: Payer: Self-pay | Admitting: Primary Care

## 2023-03-26 ENCOUNTER — Ambulatory Visit: Payer: Medicare Other

## 2023-03-26 VITALS — BP 124/72 | HR 103 | Temp 97.5°F | Ht 68.0 in | Wt 291.0 lb

## 2023-03-26 DIAGNOSIS — E1169 Type 2 diabetes mellitus with other specified complication: Secondary | ICD-10-CM | POA: Diagnosis not present

## 2023-03-26 DIAGNOSIS — E230 Hypopituitarism: Secondary | ICD-10-CM | POA: Diagnosis not present

## 2023-03-26 DIAGNOSIS — G8929 Other chronic pain: Secondary | ICD-10-CM | POA: Diagnosis not present

## 2023-03-26 DIAGNOSIS — M6281 Muscle weakness (generalized): Secondary | ICD-10-CM

## 2023-03-26 DIAGNOSIS — Z794 Long term (current) use of insulin: Secondary | ICD-10-CM

## 2023-03-26 DIAGNOSIS — E1165 Type 2 diabetes mellitus with hyperglycemia: Secondary | ICD-10-CM | POA: Diagnosis not present

## 2023-03-26 DIAGNOSIS — R262 Difficulty in walking, not elsewhere classified: Secondary | ICD-10-CM

## 2023-03-26 DIAGNOSIS — M5459 Other low back pain: Secondary | ICD-10-CM | POA: Diagnosis not present

## 2023-03-26 DIAGNOSIS — E785 Hyperlipidemia, unspecified: Secondary | ICD-10-CM

## 2023-03-26 DIAGNOSIS — M545 Low back pain, unspecified: Secondary | ICD-10-CM

## 2023-03-26 DIAGNOSIS — Z125 Encounter for screening for malignant neoplasm of prostate: Secondary | ICD-10-CM | POA: Diagnosis not present

## 2023-03-26 LAB — POCT GLYCOSYLATED HEMOGLOBIN (HGB A1C): Hemoglobin A1C: 6.5 % — AB (ref 4.0–5.6)

## 2023-03-26 LAB — PSA, MEDICARE: PSA: 1.53 ng/ml (ref 0.10–4.00)

## 2023-03-26 LAB — LIPID PANEL
Cholesterol: 112 mg/dL (ref 0–200)
HDL: 43.2 mg/dL (ref >39.00)
LDL Cholesterol: 40 mg/dL (ref 0–99)
NonHDL: 68.69
Total CHOL/HDL Ratio: 3
Triglycerides: 144 mg/dL (ref 0.0–149.0)
VLDL: 28.8 mg/dL (ref 0.0–40.0)

## 2023-03-26 LAB — TESTOSTERONE: Testosterone: 22.49 ng/dL — ABNORMAL LOW (ref 300.00–890.00)

## 2023-03-26 MED ORDER — CYCLOBENZAPRINE HCL 5 MG PO TABS
5.0000 mg | ORAL_TABLET | Freq: Every evening | ORAL | 0 refills | Status: DC | PRN
Start: 2023-03-26 — End: 2023-07-21

## 2023-03-26 NOTE — Progress Notes (Signed)
Subjective:    Patient ID: Weston Whittaker, male    DOB: 1951/08/02, 72 y.o.   MRN: [id number]  HPI  Lorene Ferrari is a very pleasant 72 y.o. male with a history of hypertension, CAD, OSA, small cell lung cancer, recurrent pleural effusion, GERD, hyperlipidemia, type 2 diabetes, PE who presents today for follow up of diabetes and chronic back pain.  His wife joins [place] today.  He is also needing labs drawn for his Urologist.   1) Type 2 Diabetes:  Current medications include: Actos 45 mg daily, Basaglar 24 units HS, Glipizide 10 mg BID, metformin 1000 mg BID.   He will inject 20-26 of Basaglar at night. He is receiving steroids through cancer treatment. He recently received a steroid injection.   He is checking his blood glucose 2 times daily and is getting readings of:  Bedtime: mid 200's to low 300's AM fasting: 120's  Last A1C: 7.1 in October 2023, 6.5 today.  Last Eye Exam: Due Last Foot Exam: Due Pneumonia Vaccination: 2019 Urine Microalbumin: UTD Statin: pravastatin   Dietary changes since last visit: He is working to increase salads. Some home cooked meals.    Exercise: None.    BP Readings from Last 3 Encounters:  03/26/23 124/72  03/19/23 134/89  03/18/23 127/80   2) Chronic Back Pain: Chronic, bilateral, with intermittent right sided sciatica. Following with orthopedics and PT currently. Received steroid injection recently, temporary improvement. He is requesting a refill of cyclobenzaprine to use HS.    Review of Systems  Respiratory:  Negative for shortness of breath.   Cardiovascular:  Positive for palpitations. Negative for chest pain.  Neurological:  Negative for dizziness.         Past Medical History:  Diagnosis Date   Chickenpox    Chronic knee pain    Chronic low back pain    COPD exacerbation 10/11/2021   COPD with exacerbation 10/12/2021   Coronary artery calcification seen on CAT scan 11/2021   Coronary CTA 11/27/2021: Moderate to large  right pleural effusion and compressive atelectasis right lung base. => Coronary Calcium Score 657.  Diffuse RCA calcification.  Minimal mild disease in the LAD and diagonal branches. == Overall limited study.  Notable artifact.   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 He has disease progression after the first line of chemotherapy in December 2021.   Testosterone deficiency    Type 2 diabetes mellitus     Social History   Socioeconomic History   Marital status: Married    Spouse name: Not on file   Number of children: Not on file   Years of education: Not on file   Highest education level: Not on file  Occupational History   Not on file  Tobacco Use   Smoking status: Former   Smokeless tobacco: Never  Vaping Use   Vaping Use: Never used  Substance and Sexual Activity   Alcohol use: Not Currently    Comment: rarely   Drug use: Not Currently   Sexual activity: Yes  Other Topics Concern   Not on file  Social History Narrative  Married.   Moved from [place].   Retired.   Social Determinants of Health   Financial Resource Strain: Low Risk  (02/25/2023)   Overall Financial Resource Strain (CARDIA)    Difficulty of Paying Living Expenses: Not hard at all  Food Insecurity: No Food Insecurity (02/25/2023)   Hunger Vital Sign    Worried About Running Out of Food in the Last Year: Never true    Ran Out of Food in the Last Year: Never true  Transportation Needs: No Transportation Needs (02/25/2023)   PRAPARE - Administrator, Civil Service (Medical): No    Lack of  Transportation (Non-Medical): No  Physical Activity: Inactive (02/25/2023)   Exercise Vital Sign    Days of Exercise per Week: 0 days    Minutes of Exercise per Session: 0 min  Stress: No Stress Concern Present (02/25/2023)   [employer] of Occupational Health - Occupational Stress Questionnaire    Feeling of Stress : Not at all  Social Connections: Moderately Isolated (02/25/2023)   Social Connection and Isolation Panel [NHANES]    Frequency of Communication with Friends and Family: Twice a week    Frequency of Social Gatherings with Friends and Family: Twice a week    Attends Religious Services: Never    Database administrator or Organizations: No    Attends Banker Meetings: Never    Marital Status: Married  Catering manager Violence: Not At Risk (02/25/2023)   Humiliation, Afraid, Rape, and Kick questionnaire    Fear of Current or Ex-Partner: No    Emotionally Abused: No    Physically Abused: No    Sexually Abused: No    Past Surgical History:  Procedure Laterality Date   CHEST TUBE INSERTION Right 01/20/2022   Procedure: CHEST TUBE INSERTION;  Surgeon: Lorin Glass, MD;  Location: [facility] ENDOSCOPY;  Service: Pulmonary;  Laterality: Right;  w/ Talc Pleurodesis, planned admit for Obs afterwards   CHEST TUBE INSERTION Right 04/28/2022   Procedure: INSERTION PLEURAL DRAINAGE CATHETER - Pigtail tail, drainage;  Surgeon: Lorin Glass, MD;  Location: [facility] ENDOSCOPY;  Service: Pulmonary;  Laterality: Right;  talc pleurodesis   CHEST TUBE INSERTION Right 09/01/2022   Procedure: INSERTION PLEURAL DRAINAGE CATHETER;  Surgeon: Lupita Leash, MD;  Location: [facility] ENDOSCOPY;  Service: Cardiopulmonary;  Laterality: Right;  pigtail catheter placement with talc pleurodesis   COLONOSCOPY WITH PROPOFOL N/A 12/17/2018   Procedure: COLONOSCOPY WITH PROPOFOL;  Surgeon: Pasty Spillers, MD;  Location: [facility] ENDOSCOPY;  Service: Gastroenterology;  Laterality: N/A;   IR IMAGING GUIDED  PORT INSERTION  04/17/2020   IR THORACENTESIS ASP PLEURAL SPACE W/IMG GUIDE  11/29/2021   IR THORACENTESIS ASP PLEURAL SPACE W/IMG GUIDE  01/03/2022   JOINT REPLACEMENT Bilateral    REPLACEMENT TOTAL KNEE BILATERAL  2015   TALC PLEURODESIS  09/01/2022   Procedure: Lurlean Nanny;  Surgeon: Lupita Leash, MD;  Location: [facility] ENDOSCOPY;  Service: Cardiopulmonary;;   TONSILLECTOMY  1960   TRANSTHORACIC ECHOCARDIOGRAM  05/2020   a) 05/2020: EF 55 to 60%.  No R WMA.  Mild LVH.  Indeterminate LVEDP.  Unable to assess RVP.  Normal aortic and mitral valves.  Mildly elevated RAP.; b) 09/2021: EF 50-55%. No RWMA. Mild LVH. ~ LVEDP. Mild LA Dilation. NORMAL RV/RAP.  Normal MV/AoV.    Family History  Problem Relation Age of Onset   Heart attack Mother    Cancer Mother    Hypertension Mother    Arthritis Father  Asthma Father    Cancer Father    COPD Father    Heart attack Father    Hypertension Sister    Cancer Sister    Diabetes Sister    Asthma Sister    Cancer Sister    COPD Sister    Arthritis Sister    Asthma Sister    Diabetes Sister    Birth defects Maternal Grandfather    Arthritis Paternal Grandmother    Diabetes Paternal Grandmother    Arthritis Paternal Grandfather    Asthma Son    Other Neg Hx        pituitary disorder    Allergies  Allergen Reactions   Bupropion     Racing heart   Hydrochlorothiazide Other (See Comments)    Cramping to lower extremities    Current Outpatient Medications on File Prior to Visit  Medication Sig Dispense Refill   albuterol (VENTOLIN HFA) 108 (90 Base) MCG/ACT inhaler Inhale 2 puffs into the lungs every 6 (six) hours as needed for wheezing or shortness of breath. 18 g 0   amLODipine (NORVASC) 10 MG tablet TAKE 1 TABLET BY MOUTH EVERY DAY FOR BLOOD PRESSURE 90 tablet 1   B-D 3CC LUER-LOK SYR 22GX1" 22G X 1" 3 ML MISC USE AS INSTRUCTED FOR TESTOSTERONE INJECTION EVERY 2 WEEKS 10 each 2   bisoprolol (ZEBETA) 5 MG tablet TAKE 1 TABLET  (5 MG TOTAL) BY MOUTH DAILY. 90 tablet 0   Black Pepper-Turmeric (TURMERIC COMPLEX/BLACK PEPPER PO) Take 1 tablet by mouth in the morning and at bedtime.     blood glucose meter kit and supplies KIT Dispense based on patient and insurance preference. Use up to four times daily as directed. 1 each 0   Coenzyme Q10 (COQ10) 200 MG CAPS Take 200 mg by mouth at bedtime.     ELIQUIS 5 MG TABS tablet TAKE 1 TABLET BY MOUTH TWICE A DAY 180 tablet 2   esomeprazole (NEXIUM) 20 MG capsule Take 20 mg by mouth in the morning.     gabapentin (NEURONTIN) 300 MG capsule TAKE 1 CAPSULE (300 MG TOTAL) BY MOUTH 2 (TWO) TIMES DAILY. FOR BACK PAIN. 180 capsule 2   glipiZIDE (GLUCOTROL) 10 MG tablet TAKE 1 TABLET (10 MG TOTAL) BY MOUTH 2 (TWO) TIMES DAILY BEFORE A MEAL. FOR DIABETES. 180 tablet 1   glucose blood (ONETOUCH ULTRA) test strip USE UP TO 4 TIMES DAILY AS DIRECTED 400 strip 5   Insulin Glargine (BASAGLAR KWIKPEN) 100 UNIT/ML INJECT 24 UNITS INTO THE SKIN AT BEDTIME. FOR DIABETES. 30 mL 1   Insulin Pen Needle (BD PEN NEEDLE NANO U/F) 32G X 4 MM MISC Use with insulin as prescribed Dx Code: E11.9 200 each 3   ipratropium-albuterol (DUONEB) 0.5-2.5 (3) MG/3ML SOLN Take 3 mLs by nebulization every 4 (four) hours while awake for 3 days, THEN 3 mLs every 4 (four) hours as needed (shortness of breath or wheezing). 360 mL 0   KRILL OIL PO Take 1 capsule by mouth daily.     Lancets MISC USE UP TO 3 TIMES DAILY AS DIRECTED 100 each 2   losartan (COZAAR) 50 MG tablet TAKE 1 TABLET (50 MG TOTAL) BY MOUTH DAILY. FOR BLOOD PRESSURE. 90 tablet 2   Melatonin 10 MG CAPS Take 10 mg by mouth at bedtime.     metFORMIN (GLUCOPHAGE) 1000 MG tablet TAKE 1 TABLET (1,000 MG TOTAL) BY MOUTH 2 (TWO) TIMES DAILY WITH A MEAL. FOR DIABETES 180 tablet 1  Multiple Vitamin (MULTI-VITAMINS) TABS Take 1 tablet by mouth in the morning.     oxymetazoline (AFRIN) 0.05 % nasal spray Place 1 spray into both nostrils daily as needed for congestion.      pioglitazone (ACTOS) 45 MG tablet TAKE 1 TABLET (45 MG TOTAL) BY MOUTH DAILY. FOR DIABETES. 90 tablet 1   pravastatin (PRAVACHOL) 40 MG tablet TAKE 1 TABLET BY MOUTH EVERY DAY IN THE EVENING FOR CHOLESTEROL 90 tablet 2   testosterone cypionate (DEPOTESTOSTERONE CYPIONATE) 200 MG/ML injection Inject 200 mg into the muscle every 14 (fourteen) days.     traMADol (ULTRAM) 50 MG tablet Take 1 tablet (50 mg total) by mouth at bedtime as needed for severe pain. 30 tablet 0   chlorpheniramine-HYDROcodone (TUSSIONEX) 10-8 MG/5ML Take 5 mLs by mouth every 12 (twelve) hours as needed for cough. (Patient not taking: Reported on 03/26/2023) 473 mL 0   No current facility-administered medications on file prior to visit.    BP 124/72   Pulse (!) 103   Temp (!) 97.5 F (36.4 C) (Temporal)   Ht 5\' 8"  (1.727 m)   Wt 291 lb (132 kg)   SpO2 98%   BMI 44.25 kg/m  Objective:   Physical Exam Cardiovascular:     Rate and Rhythm: Normal rate and regular rhythm.  Pulmonary:     Effort: Pulmonary effort is normal.     Breath sounds: Normal breath sounds. No wheezing or rales.  Musculoskeletal:     Cervical back: Neck supple.  Skin:    General: Skin is warm and dry.  Neurological:     Mental Status: He is alert and oriented to person, place, and time.           Assessment & Plan:  Type 2 diabetes mellitus with hyperglycemia, with long-term current use of insulin Assessment & Plan: Improved and at goal for now with A1C o 6.5!  Discussed that A1C may increase with steroid injections and steroid infusions. Continue Basaglar 20-26 units daily. Continue glipizide 10 mg BID and metformin 1000 mg BID.  We will follow up in late August for repeat A1C.  Orders: -     POCT glycosylated hemoglobin (Hb A1C)  Hyperlipidemia, unspecified hyperlipidemia type -     Lipid panel  Hypogonadotropic hypogonadism Assessment & Plan: Following with Urology. PSA and testosterone level ordered. He will pass  results along to his urologist.   Orders: -     Testosterone  Chronic bilateral low back pain without sciatica Assessment & Plan: Working with PT and orthopedics.  Refill provided for cyclobenzaprine.     Orders: -     Cyclobenzaprine HCl; Take 1 tablet (5 mg total) by mouth at bedtime as needed for muscle spasms.  Dispense: 90 tablet; Refill: 0  Screening for prostate cancer -     PSA, Medicare  Hyperlipidemia associated with type 2 diabetes mellitus Assessment & Plan: Continue pravastatin 40 mg daily. Repeat lipids pending.         Doreene Nest, NP

## 2023-03-26 NOTE — Assessment & Plan Note (Signed)
Working with PT and orthopedics.  Refill provided for cyclobenzaprine.

## 2023-03-26 NOTE — Assessment & Plan Note (Signed)
Following with Urology. PSA and testosterone level ordered. He will pass results along to his urologist.

## 2023-03-26 NOTE — Assessment & Plan Note (Signed)
Improved and at goal for now with A1C o 6.5!  Discussed that A1C may increase with steroid injections and steroid infusions. Continue Basaglar 20-26 units daily. Continue glipizide 10 mg BID and metformin 1000 mg BID.  We will follow up in late August for repeat A1C.

## 2023-03-26 NOTE — Therapy (Signed)
OUTPATIENT PHYSICAL THERAPY THORACOLUMBAR TREATMENT   Patient Name: Adam Wise MRN: [id number] DOB:08/16/1951, 72 y.o., male Today's Date: 03/30/2023  END OF SESSION:  PT End of Session - 03/30/23 1015     Visit Number 14    Number of Visits 26    Date for PT Re-Evaluation 05/11/23    PT Start Time 1015    PT Stop Time 1059    PT Time Calculation (min) 44 min    Activity Tolerance Patient tolerated treatment well;No increased pain    [facility] assessed/performed                        Past Medical History:  Diagnosis Date   Chickenpox    Chronic knee pain    Chronic low back pain    COPD exacerbation 10/11/2021   COPD with exacerbation 10/12/2021   Coronary artery calcification seen on CAT scan 11/2021   Coronary CTA 11/27/2021: Moderate to large right pleural effusion and compressive atelectasis right lung base. => Coronary Calcium Score 657.  Diffuse RCA calcification.  Minimal mild disease in the LAD and diagonal branches. == Overall limited study.  Notable artifact.   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 He has disease progression after the first line of chemotherapy in December 2021.   Testosterone deficiency    Type 2 diabetes mellitus    Past Surgical History:  Procedure Laterality Date   CHEST TUBE INSERTION Right 01/20/2022   Procedure: CHEST TUBE INSERTION;  Surgeon: Lorin GlassSmith, Daniel C, MD;  [facility];  Service: Pulmonary;  Laterality: Right;  w/ Talc Pleurodesis, planned admit for Obs afterwards   CHEST TUBE  INSERTION Right 04/28/2022   Procedure: INSERTION PLEURAL DRAINAGE CATHETER - Pigtail tail, drainage;  Surgeon: Lorin GlassSmith, Daniel C, MD;  [facility];  Service: Pulmonary;  Laterality: Right;  talc pleurodesis   CHEST TUBE INSERTION Right 09/01/2022   Procedure: INSERTION PLEURAL DRAINAGE CATHETER;  Surgeon: Lupita LeashMcQuaid, Douglas B, MD;  [facility];  Service: Cardiopulmonary;  Laterality: Right;  pigtail catheter placement with talc pleurodesis   COLONOSCOPY WITH PROPOFOL N/A 12/17/2018   Procedure: COLONOSCOPY WITH PROPOFOL;  Surgeon: Pasty Spillersahiliani, Varnita B, MD;  Location: [facility] ENDOSCOPY;  Service: Gastroenterology;  Laterality: N/A;   IR IMAGING GUIDED PORT INSERTION  04/17/2020   IR THORACENTESIS ASP PLEURAL SPACE W/IMG GUIDE  11/29/2021   IR THORACENTESIS ASP PLEURAL SPACE W/IMG GUIDE  01/03/2022   JOINT REPLACEMENT Bilateral    REPLACEMENT TOTAL KNEE BILATERAL  2015   TALC PLEURODESIS  09/01/2022   Procedure: Lurlean NannyALC PLEURADESIS;  Surgeon: Lupita LeashMcQuaid, Douglas B, MD;  [facility];  Service: Cardiopulmonary;;   TONSILLECTOMY  1960   TRANSTHORACIC ECHOCARDIOGRAM  05/2020   a) 05/2020: EF 55 to 60%.  No R WMA.  Mild LVH.  Indeterminate LVEDP.  Unable to assess RVP.  Normal aortic and mitral valves.  Mildly elevated RAP.; b) 09/2021: EF 50-55%. No RWMA. Mild LVH. ~ LVEDP. Mild LA Dilation. NORMAL RV/RAP.  Normal MV/AoV.   Patient Active Problem List   Diagnosis Date  Noted   Hip pain 12/09/2022   Dizziness 11/12/2022   Chronic respiratory failure with hypoxia 09/18/2022   Pneumothorax 09/02/2022   Pneumothorax on right 09/01/2022   Recurrent pleural effusion 08/27/2022   Recurrent pleural effusion on right 01/16/2022   Preop cardiovascular exam 12/19/2021   Coronary Calcium Score 657 11/13/2021   Atypical chest pain 11/13/2021   Pleural effusion 10/29/2021   Acute on chronic respiratory failure with hypoxia 10/12/2021   Recurrent right pleural effusion 08/08/2021   Erectile  dysfunction 10/26/2020   Port-A-Cath in place 06/11/2020   History of pulmonary embolism 05/15/2020   Small cell lung cancer, right upper lobe 04/12/2020   Chronic fatigue 04/12/2020   Encounter for antineoplastic chemotherapy 04/12/2020   Encounter for antineoplastic immunotherapy 04/12/2020   Malignant neoplasm of upper lobe of right lung 04/04/2020   Goals of care, counseling/discussion 03/30/2020   Exertional dyspnea 03/26/2020   Hyperlipidemia associated with type 2 diabetes mellitus 11/08/2019   Joint swelling 11/16/2018   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   Essential hypertension 09/09/2017   Type 2 diabetes mellitus 09/09/2017   GERD (gastroesophageal reflux disease) 09/09/2017   Testosterone deficiency 09/09/2017   OSA (obstructive sleep apnea) 09/09/2017   Chronic bilateral low back pain without sciatica 09/09/2017   H/O knee surgery 04/27/2014   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    PCP: Doreene Nest NP   REFERRING PROVIDER: Doreene Nest NP   REFERRING DIAG: chronic bilateral low back pain without sciatica   Rationale for Evaluation and Treatment: Rehabilitation  THERAPY DIAG:  Other low back pain  Difficulty in walking, not elsewhere classified  Muscle weakness (generalized)  ONSET DATE: December 2023  SUBJECTIVE:                                                                                                                                                                                           SUBJECTIVE STATEMENT: Patient reports feeling better today,  was rolling over and felt a pop in his back that did not worsen his pain. Has been exercising while seated at home in the mornings.    PERTINENT HISTORY:  Patient is a very pleasant 72 year old male presenting to physical therapy for chronic low back pain without sciatica. Patient has been seen by this therapist in the past. Patient additionally has relapsed extensive stage small cell lung cancer  and now multiple metastatic liver lesions diagnosed in April 2021 with history of radiotherapy and chemotherapy. PMH includes chickenpox COPD, CAD, HTN, GERD, OSA, type II DM, bilateral TKA. Patient tried figure four and stomach pressing up. Also having vertigo.   PAIN:  Are you  having pain? 7/10 pain Yes: NPRS scale: worst pain: 10/10; least amount of pain 4/10 current pain 6-7/10 Pain location: R sided low back pain radiating to back of knee; sometimes feeling like wet pants of adduction. Pain description: radiating to back pain Aggravating factors: getting up,  Relieving factors: laying down in bed, bending over the sink  PRECAUTIONS: Other: back, cancer   WEIGHT BEARING RESTRICTIONS: No  FALLS:  Has patient fallen in last 6 months? No  LIVING ENVIRONMENT: Lives with: lives with their spouse Lives in: House/apartment Stairs: Yes: Internal: flight steps; on right going up and on left going up and External: 5 steps; on right going up and on left going up Has following equipment at home: Single point cane, Environmental consultant - 4 wheeled, Tour manager, and Grab bars  OCCUPATION: retired Theatre stage manager  PLOF: Independent  PATIENT GOALS: to have less pain  NEXT MD VISIT: n/a  OBJECTIVE:   DIAGNOSTIC FINDINGS:  MR lumbar 2020 Advanced disc degeneration T12-L1 with mild spinal stenosis   Mild spinal stenosis L2-3   Moderate spinal stenosis L3-4 and L4-5  PATIENT SURVEYS:  FOTO 36  SCREENING FOR RED FLAGS: Bowel or bladder incontinence: No Spinal tumors: No Cauda equina syndrome: No Compression fracture: No Abdominal aneurysm: No  COGNITION: Overall cognitive status: Within functional limits for tasks assessed     SENSATION: Hot/Cold: Impaired R medial knee  MUSCLE LENGTH: Hamstrings: Right 25 deg; Left 40 deg   POSTURE: rounded shoulders, increased thoracic kyphosis, posterior pelvic tilt, and flexed trunk   PALPATION: Excessive muscle guarding along lumbar paraspinals  and piriformis   LUMBAR ROM:   AROM eval  Flexion limited  Extension limited  Right lateral flexion limited  Left lateral flexion limited  Right rotation limited  Left rotation limited   (Blank rows = not tested)    LOWER EXTREMITY MMT:    MMT Right eval Left eval  Hip flexion 2+* 3  Hip extension    Hip abduction 3* 3  Hip adduction 3-* 3  Knee flexion 3-* 3  Knee extension 3 3  Ankle dorsiflexion 4 4  Ankle plantarflexion 4 4   (Blank rows = not tested)  LUMBAR SPECIAL TESTS:  Straight leg raise test: Positive, SI Compression/distraction test: Positive, FABER test: Negative, and scour negative; distraction positive   FUNCTIONAL TESTS:  5 times sit to stand: unable to perform due to pain 10 meter walk test: unable to perform due to pain Sit to stand: CGA due to extreme pain Supine,<>prone; slow, extra time due to pain  GAIT: Distance walked: 3 ft Assistive device utilized: Single point cane Level of assistance: CGA Comments: severe pain with walking  TODAY'S TREATMENT:                                                                                                                              DATE: 03/19/2023  There.exBurnetta Sabin lying with bolster:   SAQ: 2x10/LE  RTB abduction 12x; 2 sets  Adduction ball squeeze 12x; 2 sets      Seated: Lateral step in/out 10x  Heel toe raise 15x LAQ modified 10x Modified march 10x each LE  Anterior/posterior pelvic tilt 15x  Manual: Hamstring stretch 60 seconds each LE IT band stretch 60 seconds each LE  Single knee to chest 60 seconds each LE Piriformis stretch 60 seconds each LE Quad  to bilateral ITB, TFL, quad for pain modulation  14 minutes  PATIENT EDUCATION:  Education details: POC, anterior pelvic stretch Person educated: Patient and Spouse Education method: Explanation, Demonstration, Tactile cues, and Verbal cues Education comprehension: verbalized understanding, returned demonstration, verbal cues  required, and tactile cues required  HOME EXERCISE PROGRAM: Anterior pelvic tilt 10x Hamstring stretch 30 seconds Lay prone 60 seconds   Access Code: [email or web address] URL: [email or web address] Date: 01/29/2023 Prepared by: Precious Bard  Exercises - Seated Anterior Pelvic Tilt  - 1 x daily - 7 x weekly - 2 sets - 10 reps - 5 hold - Supine Anterior Pelvic Tilt  - 1 x daily - 7 x weekly - 2 sets - 10 reps - 5 hold - Seated Hamstring Stretch  - 1 x daily - 7 x weekly - 2 sets - 2 reps - 30 hold - Supine Transversus Abdominis Bracing - Hands on Ground  - 1 x daily - 7 x weekly - 2 sets - 10 reps - 5 hold  ASSESSMENT:  CLINICAL IMPRESSION: Patient tolerates gentle progression of strengthening exercises.  Patient is eager to progress his strength in a pain free/pain limited range. He does have some increase in discomfort with hip flexion exercises but is able to tolerate all exercises without large increase in pain. Pt will benefit from skilled PT services to address deficits in order to improve overall QoL and return to PLOF.      OBJECTIVE IMPAIRMENTS: Abnormal gait, cardiopulmonary status limiting activity, decreased activity tolerance, decreased balance, decreased endurance, decreased mobility, difficulty walking, decreased ROM, decreased strength, hypomobility, impaired perceived functional ability, increased muscle spasms, impaired flexibility, impaired sensation, improper body mechanics, postural dysfunction, obesity, and pain.   ACTIVITY LIMITATIONS: carrying, lifting, bending, sitting, standing, squatting, sleeping, stairs, transfers, bed mobility, bathing, toileting, dressing, reach over head, hygiene/grooming, and locomotion level  PARTICIPATION LIMITATIONS: meal prep, cleaning, laundry, medication management, personal finances, interpersonal relationship, driving, shopping, community activity, occupation, yard work, and church  PERSONAL FACTORS: Age, Behavior pattern,  Education, Financial risk analyst, Past/current experiences, Profession, Sex, Social background, Time since onset of injury/illness/exacerbation, and 3+ comorbidities: chickenpox COPD, CAD, HTN, GERD, OSA, type II DM, bilateral TKA  are also affecting patient's functional outcome.   REHAB POTENTIAL: Good  CLINICAL DECISION MAKING: Evolving/moderate complexity  EVALUATION COMPLEXITY: Moderate   GOALS: Goals reviewed with patient? Yes  SHORT TERM GOALS: Target date: 04/13/2023    Patient will be independent in home exercise program to improve strength/mobility for better functional independence with ADLs. Baseline: 2/12: given to patient 4/1: compliant Goal status: MET  2.  .  Patient will report a worst pain of  5/10 on VAS in low back to improve tolerance with ADLs and reduced symptoms with activities. Baseline: 2/12: 10/10 4/1: 6/10  Goal status: Partially Met    LONG TERM GOALS: Target date: 05/11/2023    Patient will increase FOTO score to equal to or greater than 48%    to demonstrate statistically significant improvement in mobility and quality of life.  Baseline: 2/12: 36% 4/1: 37% Goal status: Partially Met  2.  Patient will report a worst pain of 3/10 on VAS in low back to improve tolerance with ADLs and reduced symptoms with activities.  Baseline: 2/12: 10/10 4/1: 6/10  Goal status: Partially Met  3.  Patient will increase 10 meter walk test to >1.69m/s as to improve gait speed for better community ambulation and to reduce fall risk Baseline: 2/12: unable to ambulate 10 meters 4/1: 17.98 seconds with SPC  Goal status: Partially Met   4.  Patient (> 69 years old) will complete five times sit to stand test in < 15 seconds indicating an increased LE strength and improved balance. Baseline: 2/12: unable to perform due to severe pain 4/1: 21 seconds with BUE support Goal status: Partially Met    PLAN:  PT FREQUENCY: 2x/week  PT DURATION: 8 weeks  PLANNED INTERVENTIONS:  Therapeutic exercises, Therapeutic activity, Neuromuscular re-education, Balance training, Gait training, Patient/Family education, Self Care, Joint mobilization, Stair training, Vestibular training, Canalith repositioning, Orthotic/Fit training, DME instructions, Electrical stimulation, Spinal manipulation, Spinal mobilization, Cryotherapy, Moist heat, Splintting, Taping, Vasopneumatic device, Traction, Ultrasound, Manual therapy, and Re-evaluation.  PLAN FOR NEXT SESSION:  Reassess tolerance for LE strengthening  Precious Bard PT  Physical Therapist- [facility]  [facility]   03/30/23, 11:17 AM

## 2023-03-26 NOTE — Assessment & Plan Note (Signed)
Continue pravastatin 40 mg daily. Repeat lipids pending.

## 2023-03-29 ENCOUNTER — Other Ambulatory Visit: Payer: Self-pay | Admitting: Internal Medicine

## 2023-03-30 ENCOUNTER — Other Ambulatory Visit: Payer: Self-pay | Admitting: Physician Assistant

## 2023-03-30 ENCOUNTER — Encounter: Payer: Self-pay | Admitting: Internal Medicine

## 2023-03-30 ENCOUNTER — Ambulatory Visit: Payer: Medicare Other

## 2023-03-30 DIAGNOSIS — M6281 Muscle weakness (generalized): Secondary | ICD-10-CM

## 2023-03-30 DIAGNOSIS — R262 Difficulty in walking, not elsewhere classified: Secondary | ICD-10-CM

## 2023-03-30 DIAGNOSIS — M5459 Other low back pain: Secondary | ICD-10-CM

## 2023-03-30 MED ORDER — HYDROCOD POLI-CHLORPHE POLI ER 10-8 MG/5ML PO SUER: 5.0000 mL | Freq: Two times a day (BID) | ORAL | 0 refills | Status: DC | PRN

## 2023-03-30 NOTE — Progress Notes (Signed)
The patient called requesting to push treatment back one week

## 2023-03-31 ENCOUNTER — Other Ambulatory Visit: Payer: Medicare Other

## 2023-03-31 ENCOUNTER — Ambulatory Visit: Payer: Medicare Other | Admitting: Internal Medicine

## 2023-03-31 ENCOUNTER — Ambulatory Visit: Payer: Medicare Other

## 2023-03-31 ENCOUNTER — Inpatient Hospital Stay: Payer: Medicare Other

## 2023-03-31 ENCOUNTER — Other Ambulatory Visit: Payer: Self-pay

## 2023-03-31 DIAGNOSIS — C787 Secondary malignant neoplasm of liver and intrahepatic bile duct: Secondary | ICD-10-CM | POA: Diagnosis not present

## 2023-03-31 DIAGNOSIS — D61810 Antineoplastic chemotherapy induced pancytopenia: Secondary | ICD-10-CM | POA: Diagnosis not present

## 2023-03-31 DIAGNOSIS — Z95828 Presence of other vascular implants and grafts: Secondary | ICD-10-CM

## 2023-03-31 DIAGNOSIS — Z452 Encounter for adjustment and management of vascular access device: Secondary | ICD-10-CM | POA: Diagnosis not present

## 2023-03-31 DIAGNOSIS — C3411 Malignant neoplasm of upper lobe, right bronchus or lung: Secondary | ICD-10-CM

## 2023-03-31 DIAGNOSIS — Z5111 Encounter for antineoplastic chemotherapy: Secondary | ICD-10-CM | POA: Diagnosis not present

## 2023-03-31 DIAGNOSIS — D6481 Anemia due to antineoplastic chemotherapy: Secondary | ICD-10-CM

## 2023-03-31 DIAGNOSIS — Z5189 Encounter for other specified aftercare: Secondary | ICD-10-CM | POA: Diagnosis not present

## 2023-03-31 LAB — CMP (CANCER CENTER ONLY)
ALT: 22 U/L (ref 0–44)
AST: 23 U/L (ref 15–41)
Albumin: 3.8 g/dL (ref 3.5–5.0)
Alkaline Phosphatase: 99 U/L (ref 38–126)
Anion gap: 9 (ref 5–15)
BUN: 18 mg/dL (ref 8–23)
CO2: 26 mmol/L (ref 22–32)
Calcium: 8.1 mg/dL — ABNORMAL LOW (ref 8.9–10.3)
Chloride: 104 mmol/L (ref 98–111)
Creatinine: 1.06 mg/dL (ref 0.61–1.24)
GFR, Estimated: >60 mL/min (ref >60)
Glucose, Bld: 194 mg/dL — ABNORMAL HIGH (ref 70–99)
Potassium: 4.1 mmol/L (ref 3.5–5.1)
Sodium: 139 mmol/L (ref 135–145)
Total Bilirubin: 0.4 mg/dL (ref 0.3–1.2)
Total Protein: 6.3 g/dL — ABNORMAL LOW (ref 6.5–8.1)

## 2023-03-31 LAB — CBC WITH DIFFERENTIAL (CANCER CENTER ONLY)
Abs Immature Granulocytes: 0.14 10*3/uL — ABNORMAL HIGH (ref 0.00–0.07)
Basophils Absolute: 0 10*3/uL (ref 0.0–0.1)
Basophils Relative: 1 %
Eosinophils Absolute: 0.1 10*3/uL (ref 0.0–0.5)
Eosinophils Relative: 1 %
HCT: 26.3 % — ABNORMAL LOW (ref 39.0–52.0)
Hemoglobin: 8.3 g/dL — ABNORMAL LOW (ref 13.0–17.0)
Immature Granulocytes: 2 %
Lymphocytes Relative: 11 %
Lymphs Abs: 0.7 10*3/uL (ref 0.7–4.0)
MCH: 32 pg (ref 26.0–34.0)
MCHC: 31.6 g/dL (ref 30.0–36.0)
MCV: 101.5 fL — ABNORMAL HIGH (ref 80.0–100.0)
Monocytes Absolute: 0.5 10*3/uL (ref 0.1–1.0)
Monocytes Relative: 8 %
Neutro Abs: 5.1 10*3/uL (ref 1.7–7.7)
Neutrophils Relative %: 77 %
Platelet Count: 40 10*3/uL — ABNORMAL LOW (ref 150–400)
RBC: 2.59 MIL/uL — ABNORMAL LOW (ref 4.22–5.81)
RDW: 19.8 % — ABNORMAL HIGH (ref 11.5–15.5)
Smear Review: NORMAL
WBC Count: 6.6 10*3/uL (ref 4.0–10.5)
nRBC: 0.5 % — ABNORMAL HIGH (ref 0.0–0.2)

## 2023-03-31 MED ORDER — SODIUM CHLORIDE 0.9% FLUSH
10.0000 mL | Freq: Once | INTRAVENOUS | Status: AC
  Administered 2023-03-31: 10 mL

## 2023-03-31 MED ORDER — HEPARIN SOD (PORK) LOCK FLUSH 100 UNIT/ML IV SOLN
500.0000 [IU] | Freq: Once | INTRAVENOUS | Status: AC
  Administered 2023-03-31: 500 [IU]

## 2023-04-01 ENCOUNTER — Ambulatory Visit: Payer: Medicare Other

## 2023-04-01 NOTE — Therapy (Signed)
OUTPATIENT PHYSICAL THERAPY THORACOLUMBAR TREATMENT   Patient Name: Adam Wise MRN: [id number] DOB:1951-05-16, 72 y.o., male Today's Date: 04/02/2023  END OF SESSION:  PT End of Session - 04/02/23 1057     Visit Number 15    Number of Visits 26    Date for PT Re-Evaluation 05/11/23    PT Start Time 1100    PT Stop Time 1145    PT Time Calculation (min) 45 min    Activity Tolerance Patient tolerated treatment well;No increased pain    Behavior During Therapy [facility] for tasks assessed/performed                         Past Medical History:  Diagnosis Date   Chickenpox    Chronic knee pain    Chronic low back pain    COPD exacerbation 10/11/2021   COPD with exacerbation 10/12/2021   Coronary artery calcification seen on CAT scan 11/2021   Coronary CTA 11/27/2021: Moderate to large right pleural effusion and compressive atelectasis right lung base. => Coronary Calcium Score 657.  Diffuse RCA calcification.  Minimal mild disease in the LAD and diagonal branches. == Overall limited study.  Notable artifact.   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 He has disease progression after the first line of chemotherapy in December 2021.   Testosterone deficiency    Type 2 diabetes mellitus    Past Surgical History:  Procedure Laterality Date   CHEST TUBE INSERTION Right 01/20/2022   Procedure: CHEST TUBE INSERTION;  Surgeon: Lorin Glass, MD;  Location: [facility] ENDOSCOPY;  Service: Pulmonary;  Laterality: Right;  w/ Talc Pleurodesis, planned admit for Obs afterwards   CHEST TUBE  INSERTION Right 04/28/2022   Procedure: INSERTION PLEURAL DRAINAGE CATHETER - Pigtail tail, drainage;  Surgeon: Lorin Glass, MD;  Location: [facility] ENDOSCOPY;  Service: Pulmonary;  Laterality: Right;  talc pleurodesis   CHEST TUBE INSERTION Right 09/01/2022   Procedure: INSERTION PLEURAL DRAINAGE CATHETER;  Surgeon: Lupita Leash, MD;  Location: [facility] ENDOSCOPY;  Service: Cardiopulmonary;  Laterality: Right;  pigtail catheter placement with talc pleurodesis   COLONOSCOPY WITH PROPOFOL N/A 12/17/2018   Procedure: COLONOSCOPY WITH PROPOFOL;  Surgeon: Pasty Spillers, MD;  Location: [facility] ENDOSCOPY;  Service: Gastroenterology;  Laterality: N/A;   IR IMAGING GUIDED PORT INSERTION  04/17/2020   IR THORACENTESIS ASP PLEURAL SPACE W/IMG GUIDE  11/29/2021   IR THORACENTESIS ASP PLEURAL SPACE W/IMG GUIDE  01/03/2022   JOINT REPLACEMENT Bilateral    REPLACEMENT TOTAL KNEE BILATERAL  2015   TALC PLEURODESIS  09/01/2022   Procedure: Lurlean Nanny;  Surgeon: Lupita Leash, MD;  Location: [facility] ENDOSCOPY;  Service: Cardiopulmonary;;   TONSILLECTOMY  1960   TRANSTHORACIC ECHOCARDIOGRAM  05/2020   a) 05/2020: EF 55 to 60%.  No R WMA.  Mild LVH.  Indeterminate LVEDP.  Unable to assess RVP.  Normal aortic and mitral valves.  Mildly elevated RAP.; b) 09/2021: EF 50-55%. No RWMA. Mild LVH. ~ LVEDP. Mild LA Dilation. NORMAL RV/RAP.  Normal MV/AoV.   Patient Active Problem List   Diagnosis  Date Noted   Hip pain 12/09/2022   Dizziness 11/12/2022   Chronic respiratory failure with hypoxia 09/18/2022   Pneumothorax 09/02/2022   Pneumothorax on right 09/01/2022   Recurrent pleural effusion 08/27/2022   Recurrent pleural effusion on right 01/16/2022   Preop cardiovascular exam 12/19/2021   Coronary Calcium Score 657 11/13/2021   Atypical chest pain 11/13/2021   Pleural effusion 10/29/2021   Acute on chronic respiratory failure with hypoxia 10/12/2021   Recurrent right pleural effusion 08/08/2021   Erectile  dysfunction 10/26/2020   Port-A-Cath in place 06/11/2020   History of pulmonary embolism 05/15/2020   Small cell lung cancer, right upper lobe 04/12/2020   Chronic fatigue 04/12/2020   Encounter for antineoplastic chemotherapy 04/12/2020   Encounter for antineoplastic immunotherapy 04/12/2020   Malignant neoplasm of upper lobe of right lung 04/04/2020   Goals of care, counseling/discussion 03/30/2020   Exertional dyspnea 03/26/2020   Hyperlipidemia associated with type 2 diabetes mellitus 11/08/2019   Joint swelling 11/16/2018   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   Essential hypertension 09/09/2017   Type 2 diabetes mellitus 09/09/2017   GERD (gastroesophageal reflux disease) 09/09/2017   Testosterone deficiency 09/09/2017   OSA (obstructive sleep apnea) 09/09/2017   Chronic bilateral low back pain without sciatica 09/09/2017   H/O knee surgery 04/27/2014   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 Having 3/10 pain at rest, when walking pain increases.    PERTINENT HISTORY:  Patient is a very pleasant 72 year old male presenting to physical therapy for chronic low back pain without sciatica. Patient has been seen by this therapist in the past. Patient additionally has relapsed extensive stage small cell lung cancer and now multiple metastatic liver lesions diagnosed in April 2021 with history  of radiotherapy and chemotherapy. PMH includes chickenpox COPD, CAD, HTN, GERD, OSA, type II DM, bilateral TKA. Patient tried figure four and stomach pressing up. Also having vertigo.   PAIN:  Are you having pain? 7/10 pain Yes: NPRS scale: worst pain: 10/10; least amount of pain 4/10 current  pain 6-7/10 Pain location: R sided low back pain radiating to back of knee; sometimes feeling like wet pants of adduction. Pain description: radiating to back pain Aggravating factors: getting up,  Relieving factors: laying down in bed, bending over the sink  PRECAUTIONS: Other: back, cancer   WEIGHT BEARING RESTRICTIONS: No  FALLS:  Has patient fallen in last 6 months? No  LIVING ENVIRONMENT: Lives with: lives with their spouse Lives in: House/apartment Stairs: Yes: Internal: flight steps; on right going up and on left going up and External: 5 steps; on right going up and on left going up Has following equipment at home: Single point cane, Environmental consultant - 4 wheeled, Tour manager, and Grab bars  OCCUPATION: retired Theatre stage manager  PLOF: Independent  PATIENT GOALS: to have less pain  NEXT MD VISIT: n/a  OBJECTIVE:   DIAGNOSTIC FINDINGS:  MR lumbar 2020 Advanced disc degeneration T12-L1 with mild spinal stenosis   Mild spinal stenosis L2-3   Moderate spinal stenosis L3-4 and L4-5  PATIENT SURVEYS:  FOTO 36  SCREENING FOR RED FLAGS: Bowel or bladder incontinence: No Spinal tumors: No Cauda equina syndrome: No Compression fracture: No Abdominal aneurysm: No  COGNITION: Overall cognitive status: Within functional limits for tasks assessed     SENSATION: Hot/Cold: Impaired R medial knee  MUSCLE LENGTH: Hamstrings: Right 25 deg; Left 40 deg   POSTURE: rounded shoulders, increased thoracic kyphosis, posterior pelvic tilt, and flexed trunk   PALPATION: Excessive muscle guarding along lumbar paraspinals and piriformis   LUMBAR ROM:   AROM eval  Flexion limited  Extension  limited  Right lateral flexion limited  Left lateral flexion limited  Right rotation limited  Left rotation limited   (Blank rows = not tested)    LOWER EXTREMITY MMT:    MMT Right eval Left eval  Hip flexion 2+* 3  Hip extension    Hip abduction 3* 3  Hip adduction 3-* 3  Knee flexion 3-* 3  Knee extension 3 3  Ankle dorsiflexion 4 4  Ankle plantarflexion 4 4   (Blank rows = not tested)  LUMBAR SPECIAL TESTS:  Straight leg raise test: Positive, SI Compression/distraction test: Positive, FABER test: Negative, and scour negative; distraction positive   FUNCTIONAL TESTS:  5 times sit to stand: unable to perform due to pain 10 meter walk test: unable to perform due to pain Sit to stand: CGA due to extreme pain Supine,<>prone; slow, extra time due to pain  GAIT: Distance walked: 3 ft Assistive device utilized: Single point cane Level of assistance: CGA Comments: severe pain with walking  TODAY'S TREATMENT:                                                                                                                              DATE: 03/19/2023  There.ex: Hook lying with bolster:  -GTB abduction 12x; 2 sets -Adduction ball squeeze 12x; 2 sets  -GTB march 12x each LE; x2 sets  Seated: Lateral step in/out 10x with ball between knees  Heel toe raise 15x  Standing: Walk with upright walker: 60 ft; education on use for at home Upright marches with walker 10x each LE  Manual: Hamstring stretch 60 seconds each LE IT band stretch 60 seconds each LE  Single knee to chest 60 seconds each LE Piriformis stretch 60 seconds each LE Roller to bilateral low back; mid back for pain modulation  8 minutes  PATIENT EDUCATION:  Education details: POC, anterior pelvic stretch Person educated: Patient and Spouse Education method: Explanation, Demonstration, Tactile cues, and Verbal cues Education comprehension: verbalized understanding, returned demonstration, verbal cues  required, and tactile cues required  HOME EXERCISE PROGRAM: Anterior pelvic tilt 10x Hamstring stretch 30 seconds Lay prone 60 seconds   Access Code: [email or web address] URL: [email or web address] Date: 01/29/2023 Prepared by: Precious Bard  Exercises - Seated Anterior Pelvic Tilt  - 1 x daily - 7 x weekly - 2 sets - 10 reps - 5 hold - Supine Anterior Pelvic Tilt  - 1 x daily - 7 x weekly - 2 sets - 10 reps - 5 hold - Seated Hamstring Stretch  - 1 x daily - 7 x weekly - 2 sets - 2 reps - 30 hold - Supine Transversus Abdominis Bracing - Hands on Ground  - 1 x daily - 7 x weekly - 2 sets - 10 reps - 5 hold  ASSESSMENT:  CLINICAL IMPRESSION: Patient introduced to upright walker for home, has good ability to ambulate with one episode of knee weakness that patient is able to safely remain upright/stable with.  Progression of strength to green theraband tolerated well. Pt will benefit from skilled PT services to address deficits in order to improve overall QoL and return to PLOF.      OBJECTIVE IMPAIRMENTS: Abnormal gait, cardiopulmonary status limiting activity, decreased activity tolerance, decreased balance, decreased endurance, decreased mobility, difficulty walking, decreased ROM, decreased strength, hypomobility, impaired perceived functional ability, increased muscle spasms, impaired flexibility, impaired sensation, improper body mechanics, postural dysfunction, obesity, and pain.   ACTIVITY LIMITATIONS: carrying, lifting, bending, sitting, standing, squatting, sleeping, stairs, transfers, bed mobility, bathing, toileting, dressing, reach over head, hygiene/grooming, and locomotion level  PARTICIPATION LIMITATIONS: meal prep, cleaning, laundry, medication management, personal finances, interpersonal relationship, driving, shopping, community activity, occupation, yard work, and church  PERSONAL FACTORS: Age, Behavior pattern, Education, Financial risk analyst, Past/current experiences, Profession,  Sex, Social background, Time since onset of injury/illness/exacerbation, and 3+ comorbidities: chickenpox COPD, CAD, HTN, GERD, OSA, type II DM, bilateral TKA  are also affecting patient's functional outcome.   REHAB POTENTIAL: Good  CLINICAL DECISION MAKING: Evolving/moderate complexity  EVALUATION COMPLEXITY: Moderate   GOALS: Goals reviewed with patient? Yes  SHORT TERM GOALS: Target date: 04/13/2023    Patient will be independent in home exercise program to improve strength/mobility for better functional independence with ADLs. Baseline: 2/12: given to patient 4/1: compliant Goal status: MET  2.  .  Patient will report a worst pain of  5/10 on VAS in low back to improve tolerance with ADLs and reduced symptoms with activities. Baseline: 2/12: 10/10 4/1: 6/10  Goal status: Partially Met    LONG TERM GOALS: Target date: 05/11/2023    Patient will increase FOTO score to equal to or greater than 48%    to demonstrate statistically significant improvement in mobility and quality of life.  Baseline: 2/12: 36% 4/1: 37% Goal status: Partially Met  2.  Patient will report a worst pain of 3/10 on VAS in  low back to improve tolerance with ADLs and reduced symptoms with activities.  Baseline: 2/12: 10/10 4/1: 6/10  Goal status: Partially Met  3.  Patient will increase 10 meter walk test to >1.18m/s as to improve gait speed for better community ambulation and to reduce fall risk Baseline: 2/12: unable to ambulate 10 meters 4/1: 17.98 seconds with SPC  Goal status: Partially Met   4.  Patient (> 33 years old) will complete five times sit to stand test in < 15 seconds indicating an increased LE strength and improved balance. Baseline: 2/12: unable to perform due to severe pain 4/1: 21 seconds with BUE support Goal status: Partially Met    PLAN:  PT FREQUENCY: 2x/week  PT DURATION: 8 weeks  PLANNED INTERVENTIONS: Therapeutic exercises, Therapeutic activity, Neuromuscular  re-education, Balance training, Gait training, Patient/Family education, Self Care, Joint mobilization, Stair training, Vestibular training, Canalith repositioning, Orthotic/Fit training, DME instructions, Electrical stimulation, Spinal manipulation, Spinal mobilization, Cryotherapy, Moist heat, Splintting, Taping, Vasopneumatic device, Traction, Ultrasound, Manual therapy, and Re-evaluation.  PLAN FOR NEXT SESSION:  Reassess tolerance for LE strengthening  Precious Bard PT  Physical Therapist- [facility]   04/02/23, 11:54 AM

## 2023-04-02 ENCOUNTER — Ambulatory Visit: Payer: Medicare Other

## 2023-04-02 DIAGNOSIS — M5459 Other low back pain: Secondary | ICD-10-CM

## 2023-04-02 DIAGNOSIS — R262 Difficulty in walking, not elsewhere classified: Secondary | ICD-10-CM

## 2023-04-02 DIAGNOSIS — M6281 Muscle weakness (generalized): Secondary | ICD-10-CM | POA: Diagnosis not present

## 2023-04-06 ENCOUNTER — Ambulatory Visit: Payer: Medicare Other

## 2023-04-07 ENCOUNTER — Other Ambulatory Visit: Payer: Medicare Other

## 2023-04-07 ENCOUNTER — Inpatient Hospital Stay: Payer: Medicare Other

## 2023-04-07 ENCOUNTER — Ambulatory Visit: Payer: Medicare Other

## 2023-04-07 ENCOUNTER — Ambulatory Visit: Payer: Medicare Other | Admitting: Internal Medicine

## 2023-04-07 DIAGNOSIS — C3411 Malignant neoplasm of upper lobe, right bronchus or lung: Secondary | ICD-10-CM | POA: Diagnosis not present

## 2023-04-07 DIAGNOSIS — Z452 Encounter for adjustment and management of vascular access device: Secondary | ICD-10-CM | POA: Diagnosis not present

## 2023-04-07 DIAGNOSIS — C787 Secondary malignant neoplasm of liver and intrahepatic bile duct: Secondary | ICD-10-CM | POA: Diagnosis not present

## 2023-04-07 DIAGNOSIS — Z5111 Encounter for antineoplastic chemotherapy: Secondary | ICD-10-CM | POA: Diagnosis not present

## 2023-04-07 DIAGNOSIS — D6481 Anemia due to antineoplastic chemotherapy: Secondary | ICD-10-CM

## 2023-04-07 DIAGNOSIS — D61810 Antineoplastic chemotherapy induced pancytopenia: Secondary | ICD-10-CM | POA: Diagnosis not present

## 2023-04-07 DIAGNOSIS — Z5189 Encounter for other specified aftercare: Secondary | ICD-10-CM | POA: Diagnosis not present

## 2023-04-07 DIAGNOSIS — Z95828 Presence of other vascular implants and grafts: Secondary | ICD-10-CM

## 2023-04-07 LAB — CBC WITH DIFFERENTIAL (CANCER CENTER ONLY)
Abs Immature Granulocytes: 0.12 10*3/uL — ABNORMAL HIGH (ref 0.00–0.07)
Basophils Absolute: 0 10*3/uL (ref 0.0–0.1)
Basophils Relative: 0 %
Eosinophils Absolute: 0.1 10*3/uL (ref 0.0–0.5)
Eosinophils Relative: 1 %
HCT: 25.3 % — ABNORMAL LOW (ref 39.0–52.0)
Hemoglobin: 8.2 g/dL — ABNORMAL LOW (ref 13.0–17.0)
Immature Granulocytes: 2 %
Lymphocytes Relative: 6 %
Lymphs Abs: 0.4 10*3/uL — ABNORMAL LOW (ref 0.7–4.0)
MCH: 33.6 pg (ref 26.0–34.0)
MCHC: 32.4 g/dL (ref 30.0–36.0)
MCV: 103.7 fL — ABNORMAL HIGH (ref 80.0–100.0)
Monocytes Absolute: 0.8 10*3/uL (ref 0.1–1.0)
Monocytes Relative: 11 %
Neutro Abs: 5.8 10*3/uL (ref 1.7–7.7)
Neutrophils Relative %: 80 %
Platelet Count: 97 10*3/uL — ABNORMAL LOW (ref 150–400)
RBC: 2.44 MIL/uL — ABNORMAL LOW (ref 4.22–5.81)
RDW: 20.6 % — ABNORMAL HIGH (ref 11.5–15.5)
WBC Count: 7.2 10*3/uL (ref 4.0–10.5)
nRBC: 0.3 % — ABNORMAL HIGH (ref 0.0–0.2)

## 2023-04-07 LAB — CMP (CANCER CENTER ONLY)
ALT: 17 U/L (ref 0–44)
AST: 22 U/L (ref 15–41)
Albumin: 3.7 g/dL (ref 3.5–5.0)
Alkaline Phosphatase: 84 U/L (ref 38–126)
Anion gap: 9 (ref 5–15)
BUN: 13 mg/dL (ref 8–23)
CO2: 27 mmol/L (ref 22–32)
Calcium: 8.3 mg/dL — ABNORMAL LOW (ref 8.9–10.3)
Chloride: 104 mmol/L (ref 98–111)
Creatinine: 1 mg/dL (ref 0.61–1.24)
GFR, Estimated: >60 mL/min (ref >60)
Glucose, Bld: 201 mg/dL — ABNORMAL HIGH (ref 70–99)
Potassium: 4.1 mmol/L (ref 3.5–5.1)
Sodium: 140 mmol/L (ref 135–145)
Total Bilirubin: 0.4 mg/dL (ref 0.3–1.2)
Total Protein: 6.1 g/dL — ABNORMAL LOW (ref 6.5–8.1)

## 2023-04-07 LAB — SAMPLE TO BLOOD BANK

## 2023-04-07 MED ORDER — HEPARIN SOD (PORK) LOCK FLUSH 100 UNIT/ML IV SOLN
500.0000 [IU] | Freq: Once | INTRAVENOUS | Status: AC
  Administered 2023-04-07: 500 [IU]

## 2023-04-07 MED ORDER — SODIUM CHLORIDE 0.9% FLUSH
10.0000 mL | Freq: Once | INTRAVENOUS | Status: AC
  Administered 2023-04-07: 10 mL

## 2023-04-07 NOTE — Patient Instructions (Signed)

## 2023-04-08 ENCOUNTER — Ambulatory Visit: Payer: Medicare Other

## 2023-04-09 ENCOUNTER — Ambulatory Visit: Payer: Medicare Other

## 2023-04-09 NOTE — Therapy (Signed)
OUTPATIENT PHYSICAL THERAPY THORACOLUMBAR TREATMENT   Patient Name: Adam Wise MRN: [id number] DOB:[birth year], 72 y.o., male Today's Date: 04/13/2023  END OF SESSION:  PT End of Session - 04/13/23 1015     Visit Number 16    Number of Visits 26    Date for PT Re-Evaluation 05/11/23    PT Start Time 1015    PT Stop Time 1100    PT Time Calculation (min) 45 min    Activity Tolerance Patient tolerated treatment well;No increased pain    Behavior During Therapy [facility] for tasks assessed/performed                         Past Medical History:  Diagnosis Date   Chickenpox    Chronic knee pain    Chronic low back pain    COPD exacerbation (HCC) 10/11/2021   COPD with exacerbation (HCC) 10/12/2021   Coronary artery calcification seen on CAT scan 11/2021   Coronary CTA 11/27/2021: Moderate to large right pleural effusion and compressive atelectasis right lung base. => Coronary Calcium Score 657.  Diffuse RCA calcification.  Minimal mild disease in the LAD and diagonal branches. == Overall limited study.  Notable artifact.   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 He has disease progression after the first line of chemotherapy in December 2021.   Testosterone deficiency    Type 2 diabetes mellitus (HCC)    Past Surgical History:  Procedure Laterality Date   CHEST TUBE INSERTION Right 01/20/2022   Procedure: CHEST TUBE INSERTION;  Surgeon: Lorin Glass, MD;  Location: [facility] ENDOSCOPY;  Service: Pulmonary;  Laterality: Right;  w/ Talc Pleurodesis, planned admit for Obs  afterwards   CHEST TUBE INSERTION Right 04/28/2022   Procedure: INSERTION PLEURAL DRAINAGE CATHETER - Pigtail tail, drainage;  Surgeon: Lorin Glass, MD;  Location: [facility] ENDOSCOPY;  Service: Pulmonary;  Laterality: Right;  talc pleurodesis   CHEST TUBE INSERTION Right 09/01/2022   Procedure: INSERTION PLEURAL DRAINAGE CATHETER;  Surgeon: Lupita Leash, MD;  Location: [facility] ENDOSCOPY;  Service: Cardiopulmonary;  Laterality: Right;  pigtail catheter placement with talc pleurodesis   COLONOSCOPY WITH PROPOFOL N/A 12/17/2018   Procedure: COLONOSCOPY WITH PROPOFOL;  Surgeon: Pasty Spillers, MD;  Location: [facility] ENDOSCOPY;  Service: Gastroenterology;  Laterality: N/A;   IR IMAGING GUIDED PORT INSERTION  04/17/2020   IR THORACENTESIS ASP PLEURAL SPACE W/IMG GUIDE  11/29/2021   IR THORACENTESIS ASP PLEURAL SPACE W/IMG GUIDE  01/03/2022   JOINT REPLACEMENT Bilateral    REPLACEMENT TOTAL KNEE BILATERAL  2015   TALC PLEURODESIS  09/01/2022   Procedure: Lurlean Nanny;  Surgeon: Lupita Leash, MD;  Location: [facility] ENDOSCOPY;  Service: Cardiopulmonary;;   TONSILLECTOMY  1960   TRANSTHORACIC ECHOCARDIOGRAM  05/2020   a) 05/2020: EF 55 to 60%.  No R WMA.  Mild LVH.  Indeterminate LVEDP.  Unable to assess RVP.  Normal aortic and mitral valves.  Mildly elevated RAP.; b) 09/2021: EF 50-55%. No RWMA. Mild LVH. ~ LVEDP. Mild LA Dilation. NORMAL RV/RAP.  Normal MV/AoV.   Patient Active Problem  List   Diagnosis Date Noted   Hip pain 12/09/2022   Dizziness 11/12/2022   Chronic respiratory failure with hypoxia (HCC) 09/18/2022   Pneumothorax 09/02/2022   Pneumothorax on right 09/01/2022   Recurrent pleural effusion 08/27/2022   Recurrent pleural effusion on right 01/16/2022   Preop cardiovascular exam 12/19/2021   Coronary Calcium Score 657 11/13/2021   Atypical chest pain 11/13/2021   Pleural effusion 10/29/2021   Acute on chronic respiratory failure with hypoxia (HCC) 10/12/2021   Recurrent right  pleural effusion 08/08/2021   Erectile dysfunction 10/26/2020   Port-A-Cath in place 06/11/2020   History of pulmonary embolism 05/15/2020   Small cell lung cancer, right upper lobe (HCC) 04/12/2020   Chronic fatigue 04/12/2020   Encounter for antineoplastic chemotherapy 04/12/2020   Encounter for antineoplastic immunotherapy 04/12/2020   Malignant neoplasm of upper lobe of right lung (HCC) 04/04/2020   Goals of care, counseling/discussion 03/30/2020   Exertional dyspnea 03/26/2020   Hyperlipidemia associated with type 2 diabetes mellitus (HCC) 11/08/2019   Joint swelling 11/16/2018   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   Essential hypertension 09/09/2017   Type 2 diabetes mellitus (HCC) 09/09/2017   GERD (gastroesophageal reflux disease) 09/09/2017   Testosterone deficiency 09/09/2017   OSA (obstructive sleep apnea) 09/09/2017   Chronic bilateral low back pain without sciatica 09/09/2017   H/O knee surgery 04/27/2014   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    PCP: Doreene Nest NP   REFERRING PROVIDER: Doreene Nest NP   REFERRING DIAG: chronic bilateral low back pain without sciatica   Rationale for Evaluation and Treatment: Rehabilitation  THERAPY DIAG:  Other low back pain  Difficulty in walking, not elsewhere classified  Muscle weakness (generalized)  ONSET DATE: December 2023  SUBJECTIVE:                                                                                                                                                                                           SUBJECTIVE STATEMENT: Patient had a week absence but has been compliant with HEP. Has a new upright walker    PERTINENT HISTORY:  Patient is a very pleasant 72 year old male presenting to physical therapy for chronic low back pain without sciatica. Patient has been seen by this therapist in the past. Patient additionally has relapsed extensive stage small cell lung cancer and now  multiple metastatic liver lesions diagnosed in April 2021 with history of radiotherapy and chemotherapy. PMH includes chickenpox COPD, CAD, HTN, GERD, OSA, type II DM, bilateral TKA. Patient tried figure four and stomach pressing up. Also having vertigo.   PAIN:  Are you having pain? 7/10  pain Yes: NPRS scale: worst pain: 10/10; least amount of pain 4/10 current pain 6-7/10 Pain location: R sided low back pain radiating to back of knee; sometimes feeling like wet pants of adduction. Pain description: radiating to back pain Aggravating factors: getting up,  Relieving factors: laying down in bed, bending over the sink  PRECAUTIONS: Other: back, cancer   WEIGHT BEARING RESTRICTIONS: No  FALLS:  Has patient fallen in last 6 months? No  LIVING ENVIRONMENT: Lives with: lives with their spouse Lives in: House/apartment Stairs: Yes: Internal: flight steps; on right going up and on left going up and External: 5 steps; on right going up and on left going up Has following equipment at home: Single point cane, Environmental consultant - 4 wheeled, Tour manager, and Grab bars  OCCUPATION: retired Theatre stage manager  PLOF: Independent  PATIENT GOALS: to have less pain  NEXT MD VISIT: n/a  OBJECTIVE:   DIAGNOSTIC FINDINGS:  MR lumbar 2020 Advanced disc degeneration T12-L1 with mild spinal stenosis   Mild spinal stenosis L2-3   Moderate spinal stenosis L3-4 and L4-5  PATIENT SURVEYS:  FOTO 36  SCREENING FOR RED FLAGS: Bowel or bladder incontinence: No Spinal tumors: No Cauda equina syndrome: No Compression fracture: No Abdominal aneurysm: No  COGNITION: Overall cognitive status: Within functional limits for tasks assessed     SENSATION: Hot/Cold: Impaired R medial knee  MUSCLE LENGTH: Hamstrings: Right 25 deg; Left 40 deg   POSTURE: rounded shoulders, increased thoracic kyphosis, posterior pelvic tilt, and flexed trunk   PALPATION: Excessive muscle guarding along lumbar paraspinals and  piriformis   LUMBAR ROM:   AROM eval  Flexion limited  Extension limited  Right lateral flexion limited  Left lateral flexion limited  Right rotation limited  Left rotation limited   (Blank rows = not tested)    LOWER EXTREMITY MMT:    MMT Right eval Left eval  Hip flexion 2+* 3  Hip extension    Hip abduction 3* 3  Hip adduction 3-* 3  Knee flexion 3-* 3  Knee extension 3 3  Ankle dorsiflexion 4 4  Ankle plantarflexion 4 4   (Blank rows = not tested)  LUMBAR SPECIAL TESTS:  Straight leg raise test: Positive, SI Compression/distraction test: Positive, FABER test: Negative, and scour negative; distraction positive   FUNCTIONAL TESTS:  5 times sit to stand: unable to perform due to pain 10 meter walk test: unable to perform due to pain Sit to stand: CGA due to extreme pain Supine,<>prone; slow, extra time due to pain  GAIT: Distance walked: 3 ft Assistive device utilized: Single point cane Level of assistance: CGA Comments: severe pain with walking  TODAY'S TREATMENT:                                                                                                                              DATE: 04/13/23   There.ex: Hook lying with bolster:  -GTB abduction 12x; 2 sets -GTB march  12x each LE; x2 sets TrA activation pressing into swiss ball 10x     Seated: adduction ball squeeze 15x 3 second holds  Lateral step in/out 10x with ball between knees  Heel toe raise 15x with adduction ball squeeze LAQ 10x each LE ; x 2 sets    Manual: Hamstring stretch 60 seconds each LE IT band stretch 60 seconds each LE  Single knee to chest 60 seconds each LE Piriformis stretch 60 seconds each LE Roller to bilateral low back; mid back for pain modulation  8 minutes  PATIENT EDUCATION:  Education details: POC, anterior pelvic stretch Person educated: Patient and Spouse Education method: Explanation, Demonstration, Tactile cues, and Verbal cues Education  comprehension: verbalized understanding, returned demonstration, verbal cues required, and tactile cues required  HOME EXERCISE PROGRAM: Anterior pelvic tilt 10x Hamstring stretch 30 seconds Lay prone 60 seconds   Access Code: [email or web address] URL: [email or web address] Date: 01/29/2023 Prepared by: Precious Bard  Exercises - Seated Anterior Pelvic Tilt  - 1 x daily - 7 x weekly - 2 sets - 10 reps - 5 hold - Supine Anterior Pelvic Tilt  - 1 x daily - 7 x weekly - 2 sets - 10 reps - 5 hold - Seated Hamstring Stretch  - 1 x daily - 7 x weekly - 2 sets - 2 reps - 30 hold - Supine Transversus Abdominis Bracing - Hands on Ground  - 1 x daily - 7 x weekly - 2 sets - 10 reps - 5 hold  ASSESSMENT:  CLINICAL IMPRESSION: Patient presents with good motivation. He is able to tolerate increased adductor strengthening exercises. Patient does have one low back spasm with LE strengthening.  Patient requires additional time changing positions but is able to perform without pain increase this session. Pt will benefit from skilled PT services to address deficits in order to improve overall QoL and return to PLOF.      OBJECTIVE IMPAIRMENTS: Abnormal gait, cardiopulmonary status limiting activity, decreased activity tolerance, decreased balance, decreased endurance, decreased mobility, difficulty walking, decreased ROM, decreased strength, hypomobility, impaired perceived functional ability, increased muscle spasms, impaired flexibility, impaired sensation, improper body mechanics, postural dysfunction, obesity, and pain.   ACTIVITY LIMITATIONS: carrying, lifting, bending, sitting, standing, squatting, sleeping, stairs, transfers, bed mobility, bathing, toileting, dressing, reach over head, hygiene/grooming, and locomotion level  PARTICIPATION LIMITATIONS: meal prep, cleaning, laundry, medication management, personal finances, interpersonal relationship, driving, shopping, community activity,  occupation, yard work, and church  PERSONAL FACTORS: Age, Behavior pattern, Education, Financial risk analyst, Past/current experiences, Profession, Sex, Social background, Time since onset of injury/illness/exacerbation, and 3+ comorbidities: chickenpox COPD, CAD, HTN, GERD, OSA, type II DM, bilateral TKA  are also affecting patient's functional outcome.   REHAB POTENTIAL: Good  CLINICAL DECISION MAKING: Evolving/moderate complexity  EVALUATION COMPLEXITY: Moderate   GOALS: Goals reviewed with patient? Yes  SHORT TERM GOALS: Target date: 04/13/2023    Patient will be independent in home exercise program to improve strength/mobility for better functional independence with ADLs. Baseline: 2/12: given to patient 4/1: compliant Goal status: MET  2.  .  Patient will report a worst pain of  5/10 on VAS in low back to improve tolerance with ADLs and reduced symptoms with activities. Baseline: 2/12: 10/10 4/1: 6/10  Goal status: Partially Met    LONG TERM GOALS: Target date: 05/11/2023    Patient will increase FOTO score to equal to or greater than 48%    to demonstrate statistically significant improvement in mobility and quality of life.  Baseline:  2/12: 36% 4/1: 37% Goal status: Partially Met  2.  Patient will report a worst pain of 3/10 on VAS in low back to improve tolerance with ADLs and reduced symptoms with activities.  Baseline: 2/12: 10/10 4/1: 6/10  Goal status: Partially Met  3.  Patient will increase 10 meter walk test to >1.69m/s as to improve gait speed for better community ambulation and to reduce fall risk Baseline: 2/12: unable to ambulate 10 meters 4/1: 17.98 seconds with SPC  Goal status: Partially Met   4.  Patient (> 73 years old) will complete five times sit to stand test in < 15 seconds indicating an increased LE strength and improved balance. Baseline: 2/12: unable to perform due to severe pain 4/1: 21 seconds with BUE support Goal status: Partially  Met    PLAN:  PT FREQUENCY: 2x/week  PT DURATION: 8 weeks  PLANNED INTERVENTIONS: Therapeutic exercises, Therapeutic activity, Neuromuscular re-education, Balance training, Gait training, Patient/Family education, Self Care, Joint mobilization, Stair training, Vestibular training, Canalith repositioning, Orthotic/Fit training, DME instructions, Electrical stimulation, Spinal manipulation, Spinal mobilization, Cryotherapy, Moist heat, Splintting, Taping, Vasopneumatic device, Traction, Ultrasound, Manual therapy, and Re-evaluation.  PLAN FOR NEXT SESSION:  Reassess tolerance for LE strengthening  Precious Bard PT  Physical Therapist- [facility]   04/13/23, 12:21 PM

## 2023-04-10 ENCOUNTER — Ambulatory Visit: Payer: Medicare Other

## 2023-04-12 ENCOUNTER — Other Ambulatory Visit: Payer: Self-pay | Admitting: Primary Care

## 2023-04-12 DIAGNOSIS — M545 Low back pain, unspecified: Secondary | ICD-10-CM

## 2023-04-12 NOTE — Progress Notes (Unsigned)
[facility] OFFICE PROGRESS NOTE  Doreene Nest, NP [address] [place] [address]  DIAGNOSIS: Relapsed extensive stage (T4, N3, M1c)  small cell lung cancer diagnosed in April 2021 and presented with large right upper lobe/right hilar mass with ipsilateral and contralateral mediastinal and right supraclavicular lymphadenopathy in addition to multiple liver lesions. He has disease progression after the first line of chemotherapy in December 2021.     PRIOR THERAPY: 1) Palliative radiotherapy to the right upper lobe lung mass under the care of Dr. Mitzi Hansen. 2) Systemic chemotherapy with carboplatin for AUC of 5 on day 1, etoposide 100 mg/M2 on days 1, 2 and 3 in addition to Imfinzi 1500 mg IV every 3 weeks with chemotherapy then every 4 weeks for maintenance if the patient has no evidence for progression.  He will also receive Cosela 240 mg/m2 on the days of the chemotherapy.  Status post 9 cycles.  Starting from cycle #5 the patient will be on maintenance treatment with immunotherapy with Imfinzi 1500 mg IV every 4 weeks. Last dose of chemotherapy was given on November 13, 2020. This treatment was discontinued secondary to disease progression 3) Systemic chemotherapy with carboplatin for AUC of 5 on day 1, etoposide 100 mg/M2 on days 1, 2 and 3, Tecentriq 1200 mg IV every 3 weeks as well as Cosela 250 mg/M2 on the days of the chemotherapy every 3 weeks.  First dose December 18, 2020.  Status post 8 cycles. 4) Zepzelca 3.2 mgm2 IV every 3 weeks. Last dose on 01/23/22. Status post 12 cycles 5) Palliative systemic chemotherapy with irinotecan 65 mg/m2 on days 1 and 8 IV every 3 weeks.  Status post 3 cycles.  Last dose was given April 01, 2022 discontinued secondary to disease progression 6) SBRT to the progressive liver lesions under the care of Dr. Mitzi Hansen.  Last fraction January 27, 2023   CURRENT THERAPY:  Systemic chemotherapy with carboplatin for AUC of 5 on day 1, etoposide 100  Mg/M2 on days 1, 2 and 3 with Cosela before the chemotherapy.  First dose expected to start on 05/28/2022.  Status post 14 cycles.  Starting from cycle #5 his carboplatin will be reduced to AUC of 4 and 2 etoposide 80 Mg/M2.   INTERVAL HISTORY: Adam Wise 72 y.o. male returns to the  clinic today for a follow-up visit accompanied by his wife.  The patient had friends visiting the last week and had a great time.  His treatment was delayed by 1 week so he can enjoy his time with his visitors.  The patient is feeling fairly well today without any concerning complaints except he is undergoing physical therapy for degenerative back changes. The patient is currently undergoing dose reduced chemotherapy. He often requires supportive care due to the increased myelosuppression from the cumulative doses of chemo.   The patient mentions today that his urine is a little bit more "dark".  He denies any dysuria or malodorous urine.  He states he has not been drinking a lot of water.  He denies any fevers but reports he has had temperature fluctuations between chills and feeling hot the last week or so.  Denies any sore throat, nasal congestion, diarrhea, abdominal pain, or sore throat.  Denies any sick contacts.  He reports that he has some gradual worsening shortness of breath with exertion and dry cough but does not feel this is significant to warrant chest x-ray at this time.  His symptoms are most  similar to when he has pleural effusions for which he previously underwent talc pleurodesis.  He also reports baseline fatigue.  Denies any headaches.  He has not had an eye exam in some time and is overdue and sometimes has "floaters".  He is here today for evaluation and repeat blood work before starting cycle #15     MEDICAL HISTORY: Past Medical History:  Diagnosis Date   Chickenpox    Chronic knee pain    Chronic low back pain    COPD exacerbation (HCC) 10/11/2021   COPD with exacerbation (HCC) 10/12/2021    Coronary artery calcification seen on CAT scan 11/2021   Coronary CTA 11/27/2021: Moderate to large right pleural effusion and compressive atelectasis right lung base. => Coronary Calcium Score 657.  Diffuse RCA calcification.  Minimal mild disease in the LAD and diagonal branches. == Overall limited study.  Notable artifact.   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 He has disease progression after the first line of chemotherapy in December 2021.   Testosterone deficiency    Type 2 diabetes mellitus (HCC)     ALLERGIES:  is allergic to bupropion and hydrochlorothiazide.  MEDICATIONS:  Current Outpatient Medications  Medication Sig Dispense Refill   albuterol (VENTOLIN HFA) 108 (90 Base) MCG/ACT inhaler Inhale 2 puffs into the lungs every 6 (six) hours as needed for wheezing or shortness of breath. 18 g 0   amLODipine (NORVASC) 10 MG tablet TAKE 1 TABLET BY MOUTH EVERY DAY FOR BLOOD PRESSURE 90 tablet 1   B-D 3CC LUER-LOK SYR 22GX1" 22G X 1" 3 ML MISC USE AS INSTRUCTED FOR TESTOSTERONE INJECTION EVERY 2 WEEKS 10 each 2   bisoprolol (ZEBETA) 5 MG tablet TAKE 1 TABLET (5 MG TOTAL) BY MOUTH DAILY. 90 tablet 0   Black Pepper-Turmeric (TURMERIC COMPLEX/BLACK PEPPER PO) Take 1 tablet by mouth in the morning and at bedtime.     blood glucose meter kit and supplies KIT Dispense based on patient and insurance preference. Use up to four times daily as directed. 1 each 0   chlorpheniramine-HYDROcodone (TUSSIONEX) 10-8 MG/5ML Take 5 mLs by mouth every 12 (twelve) hours as needed for cough. 473 mL 0    Coenzyme Q10 (COQ10) 200 MG CAPS Take 200 mg by mouth at bedtime.     cyclobenzaprine (FLEXERIL) 5 MG tablet Take 1 tablet (5 mg total) by mouth at bedtime as needed for muscle spasms. 90 tablet 0   ELIQUIS 5 MG TABS tablet TAKE 1 TABLET BY MOUTH TWICE A DAY 180 tablet 2   esomeprazole (NEXIUM) 20 MG capsule Take 20 mg by mouth in the morning.     gabapentin (NEURONTIN) 300 MG capsule TAKE 1 CAPSULE (300 MG TOTAL) BY MOUTH 2 (TWO) TIMES DAILY. FOR BACK PAIN. 180 capsule 2   glipiZIDE (GLUCOTROL) 10 MG tablet TAKE 1 TABLET (10 MG TOTAL) BY MOUTH 2 (TWO) TIMES DAILY BEFORE A MEAL. FOR DIABETES. 180 tablet 1   glucose blood (ONETOUCH ULTRA) test strip USE UP TO 4 TIMES DAILY AS DIRECTED 400 strip 5   Insulin Glargine (BASAGLAR KWIKPEN) 100 UNIT/ML INJECT  24 UNITS INTO THE SKIN AT BEDTIME. FOR DIABETES. 30 mL 1   Insulin Pen Needle (BD PEN NEEDLE NANO U/F) 32G X 4 MM MISC Use with insulin as prescribed Dx Code: E11.9 200 each 3   ipratropium-albuterol (DUONEB) 0.5-2.5 (3) MG/3ML SOLN Take 3 mLs by nebulization every 4 (four) hours while awake for 3 days, THEN 3 mLs every 4 (four) hours as needed (shortness of breath or wheezing). 360 mL 0   KRILL OIL PO Take 1 capsule by mouth daily.     Lancets MISC USE UP TO 3 TIMES DAILY AS DIRECTED 100 each 2   losartan (COZAAR) 50 MG tablet TAKE 1 TABLET (50 MG TOTAL) BY MOUTH DAILY. FOR BLOOD PRESSURE. 90 tablet 2   Melatonin 10 MG CAPS Take 10 mg by mouth at bedtime.     metFORMIN (GLUCOPHAGE) 1000 MG tablet TAKE 1 TABLET (1,000 MG TOTAL) BY MOUTH 2 (TWO) TIMES DAILY WITH A MEAL. FOR DIABETES 180 tablet 1   Multiple Vitamin (MULTI-VITAMINS) TABS Take 1 tablet by mouth in the morning.     oxymetazoline (AFRIN) 0.05 % nasal spray Place 1 spray into both nostrils daily as needed for congestion.     pioglitazone (ACTOS) 45 MG tablet TAKE 1 TABLET (45 MG TOTAL) BY MOUTH DAILY. FOR DIABETES. 90 tablet 1   pravastatin (PRAVACHOL) 40 MG tablet TAKE 1 TABLET BY MOUTH  EVERY DAY IN THE EVENING FOR CHOLESTEROL 90 tablet 2   testosterone cypionate (DEPOTESTOSTERONE CYPIONATE) 200 MG/ML injection Inject 200 mg into the muscle every 14 (fourteen) days.     traMADol (ULTRAM) 50 MG tablet Take 1 tablet (50 mg total) by mouth at bedtime as needed for severe pain. 30 tablet 0   No current facility-administered medications for this visit.    SURGICAL HISTORY:  Past Surgical History:  Procedure Laterality Date   CHEST TUBE INSERTION Right 01/20/2022   Procedure: CHEST TUBE INSERTION;  Surgeon: Lorin Glass, MD;  Location: [facility] ENDOSCOPY;  Service: Pulmonary;  Laterality: Right;  w/ Talc Pleurodesis, planned admit for Obs afterwards   CHEST TUBE INSERTION Right 04/28/2022   Procedure: INSERTION PLEURAL DRAINAGE CATHETER - Pigtail tail, drainage;  Surgeon: Lorin Glass, MD;  Location: [facility] ENDOSCOPY;  Service: Pulmonary;  Laterality: Right;  talc pleurodesis   CHEST TUBE INSERTION Right 09/01/2022   Procedure: INSERTION PLEURAL DRAINAGE CATHETER;  Surgeon: Lupita Leash, MD;  Location[facility] ENDOSCOPY;  Service: Cardiopulmonary;  Laterality: Right;  pigtail catheter placement with talc pleurodesis   COLONOSCOPY WITH PROPOFOL N/A 12/17/2018   Procedure: COLONOSCOPY WITH PROPOFOL;  Surgeon: Pasty Spillers, MD;  Location: [facility] ENDOSCOPY;  Service: Gastroenterology;  Laterality: N/A;   IR IMAGING GUIDED PORT INSERTION  04/17/2020   IR THORACENTESIS ASP PLEURAL SPACE W/IMG GUIDE  11/29/2021   IR THORACENTESIS ASP PLEURAL SPACE W/IMG GUIDE  01/03/2022   JOINT REPLACEMENT Bilateral    REPLACEMENT TOTAL KNEE BILATERAL  2015   TALC PLEURODESIS  09/01/2022   Procedure: Lurlean Nanny;  Surgeon: Lupita Leash, MD;  Location: [facility] ENDOSCOPY;  Service: Cardiopulmonary;;   TONSILLECTOMY  1960   TRANSTHORACIC ECHOCARDIOGRAM  05/2020   a) 05/2020: EF 55 to 60%.  No R WMA.  Mild LVH.  Indeterminate LVEDP.  Unable to assess RVP.  Normal aortic and mitral valves.  Mildly  elevated RAP.; b) 09/2021: EF 50-55%. No RWMA. Mild LVH. ~ LVEDP. Mild LA Dilation. NORMAL RV/RAP.  Normal MV/AoV.    REVIEW OF SYSTEMS:   Review  of Systems  Constitutional: Stated for fatigue and occasional chills.  Negative for appetite change, chills, fever and unexpected weight change.  HENT: Negative for mouth sores, nosebleeds, sore throat and trouble swallowing.   Eyes: Negative for eye problems and icterus.  Respiratory: Today for dyspnea on exertion and mild dry cough.  Negative for  hemoptysis and wheezing.   Cardiovascular: Negative for chest pain and leg swelling.  Gastrointestinal: Negative for abdominal pain, constipation, diarrhea, nausea and vomiting.  Genitourinary: Positive for dark urine. Negative for bladder incontinence, difficulty urinating, dysuria, frequency and hematuria.   Musculoskeletal: Positive for chronic back pain and right hip pain.   gait problem, neck pain and neck stiffness.  Skin: Negative for itching and rash.  Neurological: Negative for dizziness, extremity weakness, gait problem, headaches, light-headedness and seizures.  Hematological: Negative for adenopathy. Does not bruise/bleed easily.  Psychiatric/Behavioral: Negative for confusion, depression and sleep disturbance. The patient is not nervous/anxious.     PHYSICAL EXAMINATION:  Blood pressure 115/72, pulse (!) 102, temperature 98.2 F (36.8 C), temperature source Temporal, resp. rate 17, weight 293 lb 6.4 oz (133.1 kg), SpO2 95 %.  ECOG PERFORMANCE STATUS: 1  Physical Exam  Constitutional: Oriented to person, place, and time and well-developed, well-nourished, and in no distress.  HENT:  Head: Normocephalic and atraumatic.  Mouth/Throat: Oropharynx is clear and moist. No oropharyngeal exudate.  Eyes: Conjunctivae are normal. Right eye exhibits no discharge. Left eye exhibits no discharge. No scleral icterus.  Neck: Normal range of motion. Neck supple.  Cardiovascular: Normal rate, regular  rhythm, normal heart sounds and intact distal pulses.   Pulmonary/Chest: Effort normal and breath sounds normal. No respiratory distress. No wheezes. No rales.  Abdominal: Soft. Bowel sounds are normal. Exhibits no distension and no mass. There is no tenderness.  Musculoskeletal: Normal range of motion. Exhibits no edema.  Lymphadenopathy:    No cervical adenopathy.  Neurological: Alert and oriented to person, place, and time. Exhibits normal muscle tone. Examined in the wheelchair.  Skin: Skin is warm and dry. No rash noted. Not diaphoretic. No erythema. No pallor.  Psychiatric: Mood, memory and judgment normal.  Vitals reviewed.  LABORATORY DATA: Lab Results  Component Value Date   WBC 8.0 04/15/2023   HGB 9.0 (L) 04/15/2023   HCT 28.0 (L) 04/15/2023   MCV 103.7 (H) 04/15/2023   PLT 166 04/15/2023      Chemistry      Component Value Date/Time   NA 139 04/15/2023 1019   K 3.8 04/15/2023 1019   CL 102 04/15/2023 1019   CO2 28 04/15/2023 1019   BUN 17 04/15/2023 1019   CREATININE 1.10 04/15/2023 1019   CREATININE 1.45 (H) 10/18/2021 1533      Component Value Date/Time   CALCIUM 8.1 (L) 04/15/2023 1019   ALKPHOS 73 04/15/2023 1019   AST 21 04/15/2023 1019   ALT 15 04/15/2023 1019   BILITOT 0.6 04/15/2023 1019       RADIOGRAPHIC STUDIES:  No results found.   ASSESSMENT/PLAN:  This is a very pleasant 72 year old Caucasian male diagnosed with extensive stage (T4, N3, M1C) small cell lung cancer presented with large right upper lobe lung mass in addition to mediastinal and right supraclavicular lymphadenopathy and multiple metastatic liver lesions diagnosed in April 2021.    The patient initially underwent systemic chemotherapy with carboplatin for an AUC of 5 on day 1, etoposide 100 mg per metered squared on days 1, 2, and 3 in addition to Cosela for  myeloprotection.  He also received immunotherapy with Imfinzi on day 1 of every chemotherapy cycle.  He is status post 8  cycles.  Starting from cycle #5 he was on single agent immunotherapy with Imfinzi IV every 4 weeks   The patient had evidence of disease progression on his scan from December 2021.   He was then started on systemic chemotherapy with carboplatin for an AUC of 5 on day 1, etoposide 100 mg per metered squared on days 1, 2, and 3 in addition to Tecentriq 1200 mg on day 1, the patient is status post 8 cycles.  Starting from cycle #5, the patient started maintenance immunotherapy with Tecentriq.  This was discontinued due to evidence of disease progression.   The patient then underwent Zepzelca IV every 3 weeks.  He is status post his 12 cycles and he tolerated it well except for fatigue a few days after treatment.  This was discontinued in February 2023 due to evidence of disease progression.    The patient then underwent treatment with irinotecan 65 mg per metered squared on days 1 and 8 IV every 3 weeks.  The patient is status post 3 cycles and tolerated it fairly well without any concerning adverse side effects.  This was discontinued secondary to disease progression.   Dr. Arbutus Ped saw the patient after this to discuss the options including palliative care and hospice versus single agent Gemzar, or Taxol.  He also discussed repeating his initial treatment with carboplatin and etoposide since he had a good response to treatment in the past.   He was referred to Dr. Janann August at [facility]  for consideration of enrollment in the clinical trial with [email or web address].    He also saw Dr. Loney Hering at [facility] for consideration of enrollment in the [facility] [id number] trial.  The patient was not interested due to the travel burden.   The patient was interested in restarting systemic chemotherapy with carboplatin for an AUC of 5 on day 1 and etoposide 100 mg per metered squared on days 1, 2, and 3 with Cosela. He is status post 14 cycles and tolerated it well. His dose of carboplatin was reduced to an AUC of 4 and etoposide 80  mg per metered squared.    He underwent SBRT to this which was completed on 01/27/23.    Due to the lack of any good options, Dr. Arbutus Ped recommended he continue on his systemic therapy for now.     Labs were reviewed.  He will proceed with cycle #15 today scheduled.  We will have to monitor his labs closely on a weekly basis due to myelosuppression due to his several cycles of chemotherapy.  We will arrange for sample blood bank's to be drawn at every lab visit.  We will see him back for a follow up visit in 3 weeks for evaluation and repeat blood work before starting cycle #16.   I will arrange for a restaging CT scan of the CAP prior to his next appointment.  I offered him a chest x-ray today due to his slightly worsening dry cough and progressive dyspnea on exertion.  The patient states his symptoms are not significantly changed to warrant chest x-ray today but he knows he can call us earlier if he changes his mind. He feels he can wait until he has his CT scan.  He knows he can schedule his CT scan at his earliest availability and if there is any concerns for progression, infection, or progressive effusion that  we will call him sooner to review the results that his next scheduled appointment.  Will arrange for a urine culture and urine dipstick today due to his darker urine.  He was encouraged to hydrate drink more water.  The patient is okay to continue taking his Eliquis for now. I reviewed with the patient and his wife that we generally would recommend that he hold his Eliquis for platelet count less than 50,000.   He is overdue for an eye exam.  He will follow-up with his eye doctor regarding his "floaters".  The patient was advised to call immediately if she has any concerning symptoms in the interval. The patient voices understanding of current disease status and treatment options and is in agreement with the current care plan. All questions were answered. The patient knows to call the  clinic with any problems, questions or concerns. We can certainly see the patient much sooner if necessary     Orders Placed This Encounter  Procedures   Urine Culture    Standing Status:   Future    Standing Expiration Date:   04/14/2024   CT CHEST ABDOMEN PELVIS W CONTRAST    Standing Status:   Future    Standing Expiration Date:   04/14/2024    Order Specific Question:   If indicated for the ordered procedure, I authorize the administration of contrast media per Radiology protocol    Answer:   Yes    Order Specific Question:   Does the patient have a contrast media/X-ray dye allergy?    Answer:   No    Order Specific Question:   Preferred imaging location?    Answer:   [facility]    Order Specific Question:   If indicated for the ordered procedure, I authorize the administration of oral contrast media per Radiology protocol    Answer:   Yes   Total Protein, Urine dipstick    Standing Status:   Future    Standing Expiration Date:   04/14/2024   Sample to Blood Bank    Standing Status:   Standing    Number of Occurrences:   12    Standing Expiration Date:   04/14/2024    The total time spent in the appointment was 20-29 minutes  Belal Scallon L Nashali Ditmer, PA-C 04/15/23

## 2023-04-13 ENCOUNTER — Ambulatory Visit: Payer: Medicare Other

## 2023-04-13 DIAGNOSIS — M5459 Other low back pain: Secondary | ICD-10-CM

## 2023-04-13 DIAGNOSIS — M6281 Muscle weakness (generalized): Secondary | ICD-10-CM

## 2023-04-13 DIAGNOSIS — R262 Difficulty in walking, not elsewhere classified: Secondary | ICD-10-CM | POA: Diagnosis not present

## 2023-04-14 ENCOUNTER — Other Ambulatory Visit: Payer: Medicare Other

## 2023-04-14 MED FILL — Dexamethasone Sodium Phosphate Inj 100 MG/10ML: INTRAMUSCULAR | Qty: 1 | Status: AC

## 2023-04-14 MED FILL — Fosaprepitant Dimeglumine For IV Infusion 150 MG (Base Eq): INTRAVENOUS | Qty: 5 | Status: AC

## 2023-04-15 ENCOUNTER — Inpatient Hospital Stay: Payer: Medicare Other | Attending: Internal Medicine

## 2023-04-15 ENCOUNTER — Inpatient Hospital Stay (HOSPITAL_BASED_OUTPATIENT_CLINIC_OR_DEPARTMENT_OTHER): Payer: Medicare Other | Admitting: Physician Assistant

## 2023-04-15 ENCOUNTER — Other Ambulatory Visit: Payer: Self-pay

## 2023-04-15 ENCOUNTER — Inpatient Hospital Stay: Payer: Medicare Other

## 2023-04-15 ENCOUNTER — Other Ambulatory Visit: Payer: Self-pay | Admitting: Physician Assistant

## 2023-04-15 VITALS — BP 115/72 | HR 102 | Temp 98.2°F | Resp 17 | Wt 293.4 lb

## 2023-04-15 VITALS — HR 97

## 2023-04-15 DIAGNOSIS — Z452 Encounter for adjustment and management of vascular access device: Secondary | ICD-10-CM | POA: Insufficient documentation

## 2023-04-15 DIAGNOSIS — C3411 Malignant neoplasm of upper lobe, right bronchus or lung: Secondary | ICD-10-CM

## 2023-04-15 DIAGNOSIS — C787 Secondary malignant neoplasm of liver and intrahepatic bile duct: Secondary | ICD-10-CM | POA: Insufficient documentation

## 2023-04-15 DIAGNOSIS — Z95828 Presence of other vascular implants and grafts: Secondary | ICD-10-CM

## 2023-04-15 DIAGNOSIS — D61810 Antineoplastic chemotherapy induced pancytopenia: Secondary | ICD-10-CM | POA: Insufficient documentation

## 2023-04-15 DIAGNOSIS — R82998 Other abnormal findings in urine: Secondary | ICD-10-CM

## 2023-04-15 DIAGNOSIS — Z5111 Encounter for antineoplastic chemotherapy: Secondary | ICD-10-CM | POA: Insufficient documentation

## 2023-04-15 DIAGNOSIS — R300 Dysuria: Secondary | ICD-10-CM | POA: Diagnosis not present

## 2023-04-15 DIAGNOSIS — Z5189 Encounter for other specified aftercare: Secondary | ICD-10-CM | POA: Diagnosis not present

## 2023-04-15 LAB — CMP (CANCER CENTER ONLY)
ALT: 15 U/L (ref 0–44)
AST: 21 U/L (ref 15–41)
Albumin: 3.8 g/dL (ref 3.5–5.0)
Alkaline Phosphatase: 73 U/L (ref 38–126)
Anion gap: 9 (ref 5–15)
BUN: 17 mg/dL (ref 8–23)
CO2: 28 mmol/L (ref 22–32)
Calcium: 8.1 mg/dL — ABNORMAL LOW (ref 8.9–10.3)
Chloride: 102 mmol/L (ref 98–111)
Creatinine: 1.1 mg/dL (ref 0.61–1.24)
GFR, Estimated: >60 mL/min (ref >60)
Glucose, Bld: 198 mg/dL — ABNORMAL HIGH (ref 70–99)
Potassium: 3.8 mmol/L (ref 3.5–5.1)
Sodium: 139 mmol/L (ref 135–145)
Total Bilirubin: 0.6 mg/dL (ref 0.3–1.2)
Total Protein: 6.4 g/dL — ABNORMAL LOW (ref 6.5–8.1)

## 2023-04-15 LAB — URINALYSIS, COMPLETE (UACMP) WITH MICROSCOPIC
Bacteria, UA: NONE SEEN
Bilirubin Urine: NEGATIVE
Glucose, UA: 50 mg/dL — AB
Hgb urine dipstick: NEGATIVE
Ketones, ur: 5 mg/dL — AB
Leukocytes,Ua: NEGATIVE
Nitrite: NEGATIVE
Protein, ur: 30 mg/dL — AB
Specific Gravity, Urine: 1.023 (ref 1.005–1.030)
pH: 5 (ref 5.0–8.0)

## 2023-04-15 LAB — CBC WITH DIFFERENTIAL (CANCER CENTER ONLY)
Abs Immature Granulocytes: 0.05 10*3/uL (ref 0.00–0.07)
Basophils Absolute: 0 10*3/uL (ref 0.0–0.1)
Basophils Relative: 1 %
Eosinophils Absolute: 0 10*3/uL (ref 0.0–0.5)
Eosinophils Relative: 1 %
HCT: 28 % — ABNORMAL LOW (ref 39.0–52.0)
Hemoglobin: 9 g/dL — ABNORMAL LOW (ref 13.0–17.0)
Immature Granulocytes: 1 %
Lymphocytes Relative: 9 %
Lymphs Abs: 0.7 10*3/uL (ref 0.7–4.0)
MCH: 33.3 pg (ref 26.0–34.0)
MCHC: 32.1 g/dL (ref 30.0–36.0)
MCV: 103.7 fL — ABNORMAL HIGH (ref 80.0–100.0)
Monocytes Absolute: 1 10*3/uL (ref 0.1–1.0)
Monocytes Relative: 13 %
Neutro Abs: 6.2 10*3/uL (ref 1.7–7.7)
Neutrophils Relative %: 75 %
Platelet Count: 166 10*3/uL (ref 150–400)
RBC: 2.7 MIL/uL — ABNORMAL LOW (ref 4.22–5.81)
RDW: 19.7 % — ABNORMAL HIGH (ref 11.5–15.5)
WBC Count: 8 10*3/uL (ref 4.0–10.5)
nRBC: 0 % (ref 0.0–0.2)

## 2023-04-15 MED ORDER — SODIUM CHLORIDE 0.9% FLUSH
10.0000 mL | Freq: Once | INTRAVENOUS | Status: AC
  Administered 2023-04-15: 10 mL

## 2023-04-15 MED ORDER — HEPARIN SOD (PORK) LOCK FLUSH 100 UNIT/ML IV SOLN
500.0000 [IU] | Freq: Once | INTRAVENOUS | Status: AC | PRN
  Administered 2023-04-15: 500 [IU]

## 2023-04-15 MED ORDER — SODIUM CHLORIDE 0.9% FLUSH
10.0000 mL | INTRAVENOUS | Status: DC | PRN
  Administered 2023-04-15: 10 mL

## 2023-04-15 MED ORDER — SODIUM CHLORIDE 0.9 % IV SOLN
80.0000 mg/m2 | Freq: Once | INTRAVENOUS | Status: AC
  Administered 2023-04-15: 200 mg via INTRAVENOUS
  Filled 2023-04-15: qty 10

## 2023-04-15 MED ORDER — PALONOSETRON HCL INJECTION 0.25 MG/5ML
0.2500 mg | Freq: Once | INTRAVENOUS | Status: AC
  Administered 2023-04-15: 0.25 mg via INTRAVENOUS
  Filled 2023-04-15: qty 5

## 2023-04-15 MED ORDER — SODIUM CHLORIDE 0.9 % IV SOLN: Freq: Once | INTRAVENOUS | Status: AC

## 2023-04-15 MED ORDER — SODIUM CHLORIDE 0.9 % IV SOLN
150.0000 mg | Freq: Once | INTRAVENOUS | Status: AC
  Administered 2023-04-15: 150 mg via INTRAVENOUS
  Filled 2023-04-15: qty 150

## 2023-04-15 MED ORDER — SODIUM CHLORIDE 0.9 % IV SOLN
600.0000 mg | Freq: Once | INTRAVENOUS | Status: AC
  Administered 2023-04-15: 600 mg via INTRAVENOUS
  Filled 2023-04-15: qty 60

## 2023-04-15 MED ORDER — SODIUM CHLORIDE 0.9 % IV SOLN
10.0000 mg | Freq: Once | INTRAVENOUS | Status: AC
  Administered 2023-04-15: 10 mg via INTRAVENOUS
  Filled 2023-04-15: qty 10

## 2023-04-15 MED ORDER — TRILACICLIB DIHYDROCHLORIDE INJECTION 300 MG
240.0000 mg/m2 | Freq: Once | INTRAVENOUS | Status: AC
  Administered 2023-04-15: 600 mg via INTRAVENOUS
  Filled 2023-04-15: qty 40

## 2023-04-15 MED ORDER — FAMOTIDINE 20 MG IN NS 100 ML IVPB
20.0000 mg | Freq: Once | INTRAVENOUS | Status: AC
  Administered 2023-04-15: 20 mg via INTRAVENOUS
  Filled 2023-04-15: qty 100

## 2023-04-15 MED FILL — Dexamethasone Sodium Phosphate Inj 100 MG/10ML: INTRAMUSCULAR | Qty: 1 | Status: AC

## 2023-04-15 NOTE — Patient Instructions (Signed)
[facility] [facility]  Discharge Instructions: Thank you for choosing [facility] to provide your oncology and hematology care.   If you have a lab appointment with the Cancer Center, please go directly to the Cancer Center and check in at the registration area.   Wear comfortable clothing and clothing appropriate for easy access to any Portacath or PICC line.   We strive to give you quality time with your provider. You may need to reschedule your appointment if you arrive late (15 or more minutes).  Arriving late affects you and other patients whose appointments are after yours.  Also, if you miss three or more appointments without notifying the office, you may be dismissed from the clinic at the provider's discretion.      For prescription refill requests, have your pharmacy contact our office and allow 72 hours for refills to be completed.    Today you received the following chemotherapy and/or immunotherapy agents: Cosela/Carboplatin/Etoposide     To help prevent nausea and vomiting after your treatment, we encourage you to take your nausea medication as directed.  BELOW ARE SYMPTOMS THAT SHOULD BE REPORTED IMMEDIATELY: *FEVER GREATER THAN 100.4 F (38 C) OR HIGHER *CHILLS OR SWEATING *NAUSEA AND VOMITING THAT IS NOT CONTROLLED WITH YOUR NAUSEA MEDICATION *UNUSUAL SHORTNESS OF BREATH *UNUSUAL BRUISING OR BLEEDING *URINARY PROBLEMS (pain or burning when urinating, or frequent urination) *BOWEL PROBLEMS (unusual diarrhea, constipation, pain near the anus) TENDERNESS IN MOUTH AND THROAT WITH OR WITHOUT PRESENCE OF ULCERS (sore throat, sores in mouth, or a toothache) UNUSUAL RASH, SWELLING OR PAIN  UNUSUAL VAGINAL DISCHARGE OR ITCHING   Items with * indicate a potential emergency and should be followed up as soon as possible or go to the Emergency Department if any problems should occur.  Please show the CHEMOTHERAPY ALERT CARD or IMMUNOTHERAPY  ALERT CARD at check-in to the Emergency Department and triage nurse.  Should you have questions after your visit or need to cancel or reschedule your appointment, please contact [place] [facility]  Dept: [phone]  and follow the prompts.  Office hours are 8:00 a.m. to 4:30 p.m. Monday - Friday. Please note that voicemails left after 4:00 p.m. may not be returned until the following business day.  We are closed weekends and major holidays. You have access to a nurse at all times for urgent questions. Please call the main number to the clinic Dept: [phone] and follow the prompts.   For any non-urgent questions, you may also contact your provider using MyChart. We now offer e-Visits for anyone 73 and older to request care online for non-urgent symptoms. For details visit [email or web address].   Also download the MyChart app! Go to the app store, search "MyChart", open the app, select [place], and log in with your MyChart username and password.

## 2023-04-15 NOTE — Therapy (Signed)
OUTPATIENT PHYSICAL THERAPY THORACOLUMBAR TREATMENT   Patient Name: Adam Wise MRN: [id number] DOB:[birth year], 72 y.o., male Today's Date: 04/16/2023  END OF SESSION:  PT End of Session - 04/16/23 1057     Visit Number 17    Number of Visits 26    Date for PT Re-Evaluation 05/11/23    PT Start Time 1100    PT Stop Time 1144    PT Time Calculation (min) 44 min    Activity Tolerance Patient tolerated treatment well;No increased pain    Behavior During Therapy [facility] for tasks assessed/performed                          Past Medical History:  Diagnosis Date   Chickenpox    Chronic knee pain    Chronic low back pain    COPD exacerbation (HCC) 10/11/2021   COPD with exacerbation (HCC) 10/12/2021   Coronary artery calcification seen on CAT scan 11/2021   Coronary CTA 11/27/2021: Moderate to large right pleural effusion and compressive atelectasis right lung base. => Coronary Calcium Score 657.  Diffuse RCA calcification.  Minimal mild disease in the LAD and diagonal branches. == Overall limited study.  Notable artifact.   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 He has disease progression after the first line of chemotherapy in December 2021.   Testosterone deficiency    Type 2 diabetes mellitus (HCC)    Past Surgical History:  Procedure Laterality Date   CHEST TUBE INSERTION Right 01/20/2022   Procedure: CHEST TUBE INSERTION;  Surgeon: Lorin Glass, MD;  Location: [facility] ENDOSCOPY;  Service: Pulmonary;  Laterality: Right;  w/ Talc Pleurodesis, planned admit for Obs  afterwards   CHEST TUBE INSERTION Right 04/28/2022   Procedure: INSERTION PLEURAL DRAINAGE CATHETER - Pigtail tail, drainage;  Surgeon: Lorin Glass, MD;  Location: [facility] ENDOSCOPY;  Service: Pulmonary;  Laterality: Right;  talc pleurodesis   CHEST TUBE INSERTION Right 09/01/2022   Procedure: INSERTION PLEURAL DRAINAGE CATHETER;  Surgeon: Lupita Leash, MD;  Location: [facility] ENDOSCOPY;  Service: Cardiopulmonary;  Laterality: Right;  pigtail catheter placement with talc pleurodesis   COLONOSCOPY WITH PROPOFOL N/A 12/17/2018   Procedure: COLONOSCOPY WITH PROPOFOL;  Surgeon: Pasty Spillers, MD;  Location: [facility] ENDOSCOPY;  Service: Gastroenterology;  Laterality: N/A;   IR IMAGING GUIDED PORT INSERTION  04/17/2020   IR THORACENTESIS ASP PLEURAL SPACE W/IMG GUIDE  11/29/2021   IR THORACENTESIS ASP PLEURAL SPACE W/IMG GUIDE  01/03/2022   JOINT REPLACEMENT Bilateral    REPLACEMENT TOTAL KNEE BILATERAL  2015   TALC PLEURODESIS  09/01/2022   Procedure: Lurlean Nanny;  Surgeon: Lupita Leash, MD;  Location: [facility] ENDOSCOPY;  Service: Cardiopulmonary;;   TONSILLECTOMY  1960   TRANSTHORACIC ECHOCARDIOGRAM  05/2020   a) 05/2020: EF 55 to 60%.  No R WMA.  Mild LVH.  Indeterminate LVEDP.  Unable to assess RVP.  Normal aortic and mitral valves.  Mildly elevated RAP.; b) 09/2021: EF 50-55%. No RWMA. Mild LVH. ~ LVEDP. Mild LA Dilation. NORMAL RV/RAP.  Normal MV/AoV.   Patient Active  Problem List   Diagnosis Date Noted   Hip pain 12/09/2022   Dizziness 11/12/2022   Chronic respiratory failure with hypoxia (HCC) 09/18/2022   Pneumothorax 09/02/2022   Pneumothorax on right 09/01/2022   Recurrent pleural effusion 08/27/2022   Recurrent pleural effusion on right 01/16/2022   Preop cardiovascular exam 12/19/2021   Coronary Calcium Score 657 11/13/2021   Atypical chest pain 11/13/2021   Pleural effusion 10/29/2021   Acute on chronic respiratory failure with hypoxia (HCC) 10/12/2021   Recurrent right  pleural effusion 08/08/2021   Erectile dysfunction 10/26/2020   Port-A-Cath in place 06/11/2020   History of pulmonary embolism 05/15/2020   Small cell lung cancer, right upper lobe (HCC) 04/12/2020   Chronic fatigue 04/12/2020   Encounter for antineoplastic chemotherapy 04/12/2020   Encounter for antineoplastic immunotherapy 04/12/2020   Malignant neoplasm of upper lobe of right lung (HCC) 04/04/2020   Goals of care, counseling/discussion 03/30/2020   Exertional dyspnea 03/26/2020   Hyperlipidemia associated with type 2 diabetes mellitus (HCC) 11/08/2019   Joint swelling 11/16/2018   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   Essential hypertension 09/09/2017   Type 2 diabetes mellitus (HCC) 09/09/2017   GERD (gastroesophageal reflux disease) 09/09/2017   Testosterone deficiency 09/09/2017   OSA (obstructive sleep apnea) 09/09/2017   Chronic bilateral low back pain without sciatica 09/09/2017   H/O knee surgery 04/27/2014   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    PCP: Doreene Nest NP   REFERRING PROVIDER: Doreene Nest NP   REFERRING DIAG: chronic bilateral low back pain without sciatica   Rationale for Evaluation and Treatment: Rehabilitation  THERAPY DIAG:  Other low back pain  Difficulty in walking, not elsewhere classified  Muscle weakness (generalized)  ONSET DATE: December 2023  SUBJECTIVE:                                                                                                                                                                                           SUBJECTIVE STATEMENT: Patient has his infusion after his PT appointment today. Has been compliant with HEP.    PERTINENT HISTORY:  Patient is a very pleasant 72 year old male presenting to physical therapy for chronic low back pain without sciatica. Patient has been seen by this therapist in the past. Patient additionally has relapsed extensive stage small cell lung cancer and now  multiple metastatic liver lesions diagnosed in April 2021 with history of radiotherapy and chemotherapy. PMH includes chickenpox COPD, CAD, HTN, GERD, OSA, type II DM, bilateral TKA. Patient tried figure four and stomach pressing up. Also having vertigo.   PAIN:  Are you having pain? 7/10 pain  Yes: NPRS scale: worst pain: 10/10; least amount of pain 4/10 current pain 6-7/10 Pain location: R sided low back pain radiating to back of knee; sometimes feeling like wet pants of adduction. Pain description: radiating to back pain Aggravating factors: getting up,  Relieving factors: laying down in bed, bending over the sink  PRECAUTIONS: Other: back, cancer   WEIGHT BEARING RESTRICTIONS: No  FALLS:  Has patient fallen in last 6 months? No  LIVING ENVIRONMENT: Lives with: lives with their spouse Lives in: House/apartment Stairs: Yes: Internal: flight steps; on right going up and on left going up and External: 5 steps; on right going up and on left going up Has following equipment at home: Single point cane, Environmental consultant - 4 wheeled, Tour manager, and Grab bars  OCCUPATION: retired Theatre stage manager  PLOF: Independent  PATIENT GOALS: to have less pain  NEXT MD VISIT: n/a  OBJECTIVE:   DIAGNOSTIC FINDINGS:  MR lumbar 2020 Advanced disc degeneration T12-L1 with mild spinal stenosis   Mild spinal stenosis L2-3   Moderate spinal stenosis L3-4 and L4-5  PATIENT SURVEYS:  FOTO 36  SCREENING FOR RED FLAGS: Bowel or bladder incontinence: No Spinal tumors: No Cauda equina syndrome: No Compression fracture: No Abdominal aneurysm: No  COGNITION: Overall cognitive status: Within functional limits for tasks assessed     SENSATION: Hot/Cold: Impaired R medial knee  MUSCLE LENGTH: Hamstrings: Right 25 deg; Left 40 deg   POSTURE: rounded shoulders, increased thoracic kyphosis, posterior pelvic tilt, and flexed trunk   PALPATION: Excessive muscle guarding along lumbar paraspinals and  piriformis   LUMBAR ROM:   AROM eval  Flexion limited  Extension limited  Right lateral flexion limited  Left lateral flexion limited  Right rotation limited  Left rotation limited   (Blank rows = not tested)    LOWER EXTREMITY MMT:    MMT Right eval Left eval  Hip flexion 2+* 3  Hip extension    Hip abduction 3* 3  Hip adduction 3-* 3  Knee flexion 3-* 3  Knee extension 3 3  Ankle dorsiflexion 4 4  Ankle plantarflexion 4 4   (Blank rows = not tested)  LUMBAR SPECIAL TESTS:  Straight leg raise test: Positive, SI Compression/distraction test: Positive, FABER test: Negative, and scour negative; distraction positive   FUNCTIONAL TESTS:  5 times sit to stand: unable to perform due to pain 10 meter walk test: unable to perform due to pain Sit to stand: CGA due to extreme pain Supine,<>prone; slow, extra time due to pain  GAIT: Distance walked: 3 ft Assistive device utilized: Single point cane Level of assistance: CGA Comments: severe pain with walking  TODAY'S TREATMENT:                                                                                                                              DATE: 04/16/23  BP at seat 120/72 ; standing 119/79 There.ex: Hook lying with bolster:  -GTB abduction  12x; 2 sets -GTB march 12x each LE; x2 sets -adduction ball squeeze 15x; 2 sets    Seated: adduction ball squeeze 15x 3 second holds  Heel toe raise 15x with adduction ball squeeze LAQ 10x each LE ; x 2 sets    Manual: Hamstring stretch 60 seconds each LE IT band stretch 60 seconds each LE  Single knee to chest 60 seconds each LE Piriformis stretch 60 seconds each LE Roller to bilateral low back; mid back for pain modulation  8 minutes  PATIENT EDUCATION:  Education details: POC, anterior pelvic stretch Person educated: Patient and Spouse Education method: Explanation, Demonstration, Tactile cues, and Verbal cues Education comprehension: verbalized  understanding, returned demonstration, verbal cues required, and tactile cues required  HOME EXERCISE PROGRAM: Anterior pelvic tilt 10x Hamstring stretch 30 seconds Lay prone 60 seconds   Access Code: [email or web address] URL: [email or web address] Date: 01/29/2023 Prepared by: Precious Bard  Exercises - Seated Anterior Pelvic Tilt  - 1 x daily - 7 x weekly - 2 sets - 10 reps - 5 hold - Supine Anterior Pelvic Tilt  - 1 x daily - 7 x weekly - 2 sets - 10 reps - 5 hold - Seated Hamstring Stretch  - 1 x daily - 7 x weekly - 2 sets - 2 reps - 30 hold - Supine Transversus Abdominis Bracing - Hands on Ground  - 1 x daily - 7 x weekly - 2 sets - 10 reps - 5 hold  ASSESSMENT:  CLINICAL IMPRESSION: Patient tolerates strengthening interventions well with minimal pain increase. He is able to perform marching without spasm this session. LAQ is challenging due to hamstring length limitations. Patient is "whoozy" with standing ; BP monitored and WFL.  Pt will benefit from skilled PT services to address deficits in order to improve overall QoL and return to PLOF.      OBJECTIVE IMPAIRMENTS: Abnormal gait, cardiopulmonary status limiting activity, decreased activity tolerance, decreased balance, decreased endurance, decreased mobility, difficulty walking, decreased ROM, decreased strength, hypomobility, impaired perceived functional ability, increased muscle spasms, impaired flexibility, impaired sensation, improper body mechanics, postural dysfunction, obesity, and pain.   ACTIVITY LIMITATIONS: carrying, lifting, bending, sitting, standing, squatting, sleeping, stairs, transfers, bed mobility, bathing, toileting, dressing, reach over head, hygiene/grooming, and locomotion level  PARTICIPATION LIMITATIONS: meal prep, cleaning, laundry, medication management, personal finances, interpersonal relationship, driving, shopping, community activity, occupation, yard work, and church  PERSONAL FACTORS: Age,  Behavior pattern, Education, Financial risk analyst, Past/current experiences, Profession, Sex, Social background, Time since onset of injury/illness/exacerbation, and 3+ comorbidities: chickenpox COPD, CAD, HTN, GERD, OSA, type II DM, bilateral TKA  are also affecting patient's functional outcome.   REHAB POTENTIAL: Good  CLINICAL DECISION MAKING: Evolving/moderate complexity  EVALUATION COMPLEXITY: Moderate   GOALS: Goals reviewed with patient? Yes  SHORT TERM GOALS: Target date: 04/13/2023    Patient will be independent in home exercise program to improve strength/mobility for better functional independence with ADLs. Baseline: 2/12: given to patient 4/1: compliant Goal status: MET  2.  .  Patient will report a worst pain of  5/10 on VAS in low back to improve tolerance with ADLs and reduced symptoms with activities. Baseline: 2/12: 10/10 4/1: 6/10  Goal status: Partially Met    LONG TERM GOALS: Target date: 05/11/2023    Patient will increase FOTO score to equal to or greater than 48%    to demonstrate statistically significant improvement in mobility and quality of life.  Baseline: 2/12: 36% 4/1: 37% Goal status: Partially Met  2.  Patient will report a worst pain of 3/10 on VAS in low back to improve tolerance with ADLs and reduced symptoms with activities.  Baseline: 2/12: 10/10 4/1: 6/10  Goal status: Partially Met  3.  Patient will increase 10 meter walk test to >1.5m/s as to improve gait speed for better community ambulation and to reduce fall risk Baseline: 2/12: unable to ambulate 10 meters 4/1: 17.98 seconds with SPC  Goal status: Partially Met   4.  Patient (> 46 years old) will complete five times sit to stand test in < 15 seconds indicating an increased LE strength and improved balance. Baseline: 2/12: unable to perform due to severe pain 4/1: 21 seconds with BUE support Goal status: Partially Met    PLAN:  PT FREQUENCY: 2x/week  PT DURATION: 8 weeks  PLANNED  INTERVENTIONS: Therapeutic exercises, Therapeutic activity, Neuromuscular re-education, Balance training, Gait training, Patient/Family education, Self Care, Joint mobilization, Stair training, Vestibular training, Canalith repositioning, Orthotic/Fit training, DME instructions, Electrical stimulation, Spinal manipulation, Spinal mobilization, Cryotherapy, Moist heat, Splintting, Taping, Vasopneumatic device, Traction, Ultrasound, Manual therapy, and Re-evaluation.  PLAN FOR NEXT SESSION:  Reassess tolerance for LE strengthening  Precious Bard PT  Physical Therapist- [facility]  [facility]   04/16/23, 11:51 AM

## 2023-04-16 ENCOUNTER — Ambulatory Visit: Payer: Medicare Other | Attending: Primary Care

## 2023-04-16 ENCOUNTER — Inpatient Hospital Stay: Payer: Medicare Other

## 2023-04-16 VITALS — BP 129/82 | HR 96 | Temp 98.8°F | Resp 18

## 2023-04-16 DIAGNOSIS — M5459 Other low back pain: Secondary | ICD-10-CM | POA: Insufficient documentation

## 2023-04-16 DIAGNOSIS — R262 Difficulty in walking, not elsewhere classified: Secondary | ICD-10-CM | POA: Diagnosis not present

## 2023-04-16 DIAGNOSIS — C3411 Malignant neoplasm of upper lobe, right bronchus or lung: Secondary | ICD-10-CM | POA: Diagnosis not present

## 2023-04-16 DIAGNOSIS — M6281 Muscle weakness (generalized): Secondary | ICD-10-CM | POA: Insufficient documentation

## 2023-04-16 DIAGNOSIS — D61810 Antineoplastic chemotherapy induced pancytopenia: Secondary | ICD-10-CM | POA: Diagnosis not present

## 2023-04-16 DIAGNOSIS — Z5111 Encounter for antineoplastic chemotherapy: Secondary | ICD-10-CM | POA: Diagnosis not present

## 2023-04-16 DIAGNOSIS — Z452 Encounter for adjustment and management of vascular access device: Secondary | ICD-10-CM | POA: Diagnosis not present

## 2023-04-16 DIAGNOSIS — Z5189 Encounter for other specified aftercare: Secondary | ICD-10-CM | POA: Diagnosis not present

## 2023-04-16 DIAGNOSIS — C787 Secondary malignant neoplasm of liver and intrahepatic bile duct: Secondary | ICD-10-CM | POA: Diagnosis not present

## 2023-04-16 MED ORDER — TRILACICLIB DIHYDROCHLORIDE INJECTION 300 MG
240.0000 mg/m2 | Freq: Once | INTRAVENOUS | Status: AC
  Administered 2023-04-16: 600 mg via INTRAVENOUS
  Filled 2023-04-16: qty 40

## 2023-04-16 MED ORDER — SODIUM CHLORIDE 0.9% FLUSH
10.0000 mL | INTRAVENOUS | Status: DC | PRN
  Administered 2023-04-16: 10 mL

## 2023-04-16 MED ORDER — SODIUM CHLORIDE 0.9 % IV SOLN
10.0000 mg | Freq: Once | INTRAVENOUS | Status: AC
  Administered 2023-04-16: 10 mg via INTRAVENOUS
  Filled 2023-04-16: qty 10

## 2023-04-16 MED ORDER — HEPARIN SOD (PORK) LOCK FLUSH 100 UNIT/ML IV SOLN
500.0000 [IU] | Freq: Once | INTRAVENOUS | Status: AC | PRN
  Administered 2023-04-16: 500 [IU]

## 2023-04-16 MED ORDER — SODIUM CHLORIDE 0.9 % IV SOLN
80.0000 mg/m2 | Freq: Once | INTRAVENOUS | Status: AC
  Administered 2023-04-16: 200 mg via INTRAVENOUS
  Filled 2023-04-16: qty 10

## 2023-04-16 MED ORDER — SODIUM CHLORIDE 0.9 % IV SOLN: Freq: Once | INTRAVENOUS | Status: AC

## 2023-04-16 MED FILL — Dexamethasone Sodium Phosphate Inj 100 MG/10ML: INTRAMUSCULAR | Qty: 1 | Status: AC

## 2023-04-16 NOTE — Therapy (Signed)
OUTPATIENT PHYSICAL THERAPY THORACOLUMBAR TREATMENT   Patient Name: Adam Wise MRN: [id number] DOB:1950-12-18, 72 y.o., male Today's Date: 04/20/2023  END OF SESSION:  PT End of Session - 04/20/23 1012     Visit Number 18    Number of Visits 26    Date for PT Re-Evaluation 05/11/23    PT Start Time 1015    PT Stop Time 1059    PT Time Calculation (min) 44 min    Activity Tolerance Patient tolerated treatment well;No increased pain    Behavior During Therapy [facility] for tasks assessed/performed                           Past Medical History:  Diagnosis Date   Chickenpox    Chronic knee pain    Chronic low back pain    COPD exacerbation (HCC) 10/11/2021   COPD with exacerbation (HCC) 10/12/2021   Coronary artery calcification seen on CAT scan 11/2021   Coronary CTA 11/27/2021: Moderate to large right pleural effusion and compressive atelectasis right lung base. => Coronary Calcium Score 657.  Diffuse RCA calcification.  Minimal mild disease in the LAD and diagonal branches. == Overall limited study.  Notable artifact.   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 He has disease progression after the first line of chemotherapy in December 2021.   Testosterone deficiency    Type 2 diabetes mellitus (HCC)    Past Surgical History:  Procedure Laterality Date   CHEST TUBE INSERTION Right 01/20/2022   Procedure: CHEST TUBE INSERTION;  Surgeon: Lorin Glass, MD;  Location: [facility] ENDOSCOPY;  Service: Pulmonary;  Laterality: Right;  w/ Talc Pleurodesis, planned admit for Obs  afterwards   CHEST TUBE INSERTION Right 04/28/2022   Procedure: INSERTION PLEURAL DRAINAGE CATHETER - Pigtail tail, drainage;  Surgeon: Lorin Glass, MD;  Location: [facility] ENDOSCOPY;  Service: Pulmonary;  Laterality: Right;  talc pleurodesis   CHEST TUBE INSERTION Right 09/01/2022   Procedure: INSERTION PLEURAL DRAINAGE CATHETER;  Surgeon: Lupita Leash, MD;  Location: [facility] ENDOSCOPY;  Service: Cardiopulmonary;  Laterality: Right;  pigtail catheter placement with talc pleurodesis   COLONOSCOPY WITH PROPOFOL N/A 12/17/2018   Procedure: COLONOSCOPY WITH PROPOFOL;  Surgeon: Pasty Spillers, MD;  Location: [facility] ENDOSCOPY;  Service: Gastroenterology;  Laterality: N/A;   IR IMAGING GUIDED PORT INSERTION  04/17/2020   IR THORACENTESIS ASP PLEURAL SPACE W/IMG GUIDE  11/29/2021   IR THORACENTESIS ASP PLEURAL SPACE W/IMG GUIDE  01/03/2022   JOINT REPLACEMENT Bilateral    REPLACEMENT TOTAL KNEE BILATERAL  2015   TALC PLEURODESIS  09/01/2022   Procedure: Lurlean Nanny;  Surgeon: Lupita Leash, MD;  Location: [facility] ENDOSCOPY;  Service: Cardiopulmonary;;   TONSILLECTOMY  1960   TRANSTHORACIC ECHOCARDIOGRAM  05/2020   a) 05/2020: EF 55 to 60%.  No R WMA.  Mild LVH.  Indeterminate LVEDP.  Unable to assess RVP.  Normal aortic and mitral valves.  Mildly elevated RAP.; b) 09/2021: EF 50-55%. No RWMA. Mild LVH. ~ LVEDP. Mild LA Dilation. NORMAL RV/RAP.  Normal MV/AoV.   Patient  Active Problem List   Diagnosis Date Noted   Hip pain 12/09/2022   Dizziness 11/12/2022   Chronic respiratory failure with hypoxia (HCC) 09/18/2022   Pneumothorax 09/02/2022   Pneumothorax on right 09/01/2022   Recurrent pleural effusion 08/27/2022   Recurrent pleural effusion on right 01/16/2022   Preop cardiovascular exam 12/19/2021   Coronary Calcium Score 657 11/13/2021   Atypical chest pain 11/13/2021   Pleural effusion 10/29/2021   Acute on chronic respiratory failure with hypoxia (HCC) 10/12/2021   Recurrent right  pleural effusion 08/08/2021   Erectile dysfunction 10/26/2020   Port-A-Cath in place 06/11/2020   History of pulmonary embolism 05/15/2020   Small cell lung cancer, right upper lobe (HCC) 04/12/2020   Chronic fatigue 04/12/2020   Encounter for antineoplastic chemotherapy 04/12/2020   Encounter for antineoplastic immunotherapy 04/12/2020   Malignant neoplasm of upper lobe of right lung (HCC) 04/04/2020   Goals of care, counseling/discussion 03/30/2020   Exertional dyspnea 03/26/2020   Hyperlipidemia associated with type 2 diabetes mellitus (HCC) 11/08/2019   Joint swelling 11/16/2018   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   Essential hypertension 09/09/2017   Type 2 diabetes mellitus (HCC) 09/09/2017   GERD (gastroesophageal reflux disease) 09/09/2017   Testosterone deficiency 09/09/2017   OSA (obstructive sleep apnea) 09/09/2017   Chronic bilateral low back pain without sciatica 09/09/2017   H/O knee surgery 04/27/2014   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 Is a 7/10 pain.    PERTINENT HISTORY:  Patient is a very pleasant 72 year old male presenting to physical therapy for chronic low back pain without sciatica. Patient has been seen by this therapist in the past. Patient additionally has relapsed extensive stage small cell lung cancer and now multiple metastatic  liver lesions diagnosed in April 2021 with history of radiotherapy and chemotherapy. PMH includes chickenpox COPD, CAD, HTN, GERD, OSA, type II DM, bilateral TKA. Patient tried figure four and stomach pressing up. Also having vertigo.   PAIN:  Are you having pain? 7/10 pain  Yes: NPRS scale: worst pain: 10/10; least amount of pain 4/10 current pain 6-7/10 Pain location: R sided low back pain radiating to back of knee; sometimes feeling like wet pants of adduction. Pain description: radiating to back pain Aggravating factors: getting up,  Relieving factors: laying down in bed, bending over the sink  PRECAUTIONS: Other: back, cancer   WEIGHT BEARING RESTRICTIONS: No  FALLS:  Has patient fallen in last 6 months? No  LIVING ENVIRONMENT: Lives with: lives with their spouse Lives in: House/apartment Stairs: Yes: Internal: flight steps; on right going up and on left going up and External: 5 steps; on right going up and on left going up Has following equipment at home: Single point cane, Environmental consultant - 4 wheeled, Tour manager, and Grab bars  OCCUPATION: retired Theatre stage manager  PLOF: Independent  PATIENT GOALS: to have less pain  NEXT MD VISIT: n/a  OBJECTIVE:   DIAGNOSTIC FINDINGS:  MR lumbar 2020 Advanced disc degeneration T12-L1 with mild spinal stenosis   Mild spinal stenosis L2-3   Moderate spinal stenosis L3-4 and L4-5  PATIENT SURVEYS:  FOTO 36  SCREENING FOR RED FLAGS: Bowel or bladder incontinence: No Spinal tumors: No Cauda equina syndrome: No Compression fracture: No Abdominal aneurysm: No  COGNITION: Overall cognitive status: Within functional limits for tasks assessed     SENSATION: Hot/Cold: Impaired R medial knee  MUSCLE LENGTH: Hamstrings: Right 25 deg; Left 40 deg   POSTURE: rounded shoulders, increased thoracic kyphosis, posterior pelvic tilt, and flexed trunk   PALPATION: Excessive muscle guarding along lumbar paraspinals and piriformis   LUMBAR ROM:    AROM eval  Flexion limited  Extension limited  Right lateral flexion limited  Left lateral flexion limited  Right rotation limited  Left rotation limited   (Blank rows = not tested)    LOWER EXTREMITY MMT:    MMT Right eval Left eval  Hip flexion 2+* 3  Hip extension    Hip abduction 3* 3  Hip adduction 3-* 3  Knee flexion 3-* 3  Knee extension 3 3  Ankle dorsiflexion 4 4  Ankle plantarflexion 4 4   (Blank rows = not tested)  LUMBAR SPECIAL TESTS:  Straight leg raise test: Positive, SI Compression/distraction test: Positive, FABER test: Negative, and scour negative; distraction positive   FUNCTIONAL TESTS:  5 times sit to stand: unable to perform due to pain 10 meter walk test: unable to perform due to pain Sit to stand: CGA due to extreme pain Supine,<>prone; slow, extra time due to pain  GAIT: Distance walked: 3 ft Assistive device utilized: Single point cane Level of assistance: CGA Comments: severe pain with walking  TODAY'S TREATMENT:                                                                                                                              DATE: 04/20/23  BP at seat 120/72 ; standing 119/79 There.ex:    Seated: Large swiss ball  forward rollout 10x  adduction ball squeeze 15x 3 second holds  Heel toe raise 15x with adduction ball squeeze    Manual:x2 sets each hold Hamstring stretch 60 seconds each LE IT band stretch 60 seconds each LE  Single knee to chest 60 seconds each LE Piriformis stretch 60 seconds each LE Roller to bilateral low back; mid back for pain modulation  8 minutes  PATIENT EDUCATION:  Education details: POC, anterior pelvic stretch Person educated: Patient and Spouse Education method: Explanation, Demonstration, Tactile cues, and Verbal cues Education comprehension: verbalized understanding, returned demonstration, verbal cues required, and tactile cues required  HOME EXERCISE PROGRAM: Anterior pelvic tilt  10x Hamstring stretch 30 seconds Lay prone 60 seconds   Access Code: [email or web address] URL: [email or web address] Date: 01/29/2023 Prepared by: Precious Bard  Exercises - Seated Anterior Pelvic Tilt  - 1 x daily - 7 x weekly - 2 sets - 10 reps - 5 hold - Supine Anterior Pelvic Tilt  - 1 x daily - 7 x weekly - 2 sets - 10 reps - 5 hold - Seated Hamstring Stretch  - 1 x daily - 7 x weekly - 2 sets - 2 reps - 30 hold - Supine Transversus Abdominis Bracing - Hands on Ground  - 1 x daily - 7 x weekly - 2 sets - 10 reps - 5 hold  ASSESSMENT:  CLINICAL IMPRESSION: Patient session limited by patient having episode of spamming from dehydration. Education on hydration. Patient having difficulty with swallowing; educated on potential need for thickened liquids. Will follow up as needed.   Pt will benefit from skilled PT services to address deficits in order to improve overall QoL and return to PLOF.      OBJECTIVE IMPAIRMENTS: Abnormal gait, cardiopulmonary status limiting activity, decreased activity tolerance, decreased balance, decreased endurance, decreased mobility, difficulty walking, decreased ROM, decreased strength, hypomobility, impaired perceived functional ability, increased muscle spasms, impaired flexibility, impaired sensation, improper body mechanics, postural dysfunction, obesity, and pain.   ACTIVITY LIMITATIONS: carrying, lifting, bending, sitting, standing, squatting, sleeping, stairs, transfers, bed mobility, bathing, toileting, dressing, reach over head, hygiene/grooming, and locomotion level  PARTICIPATION LIMITATIONS: meal prep, cleaning, laundry, medication management, personal finances, interpersonal relationship, driving, shopping, community activity, occupation, yard work, and church  PERSONAL FACTORS: Age, Behavior pattern, Education, Financial risk analyst, Past/current experiences, Profession, Sex, Social background, Time since onset of injury/illness/exacerbation, and 3+  comorbidities: chickenpox COPD, CAD, HTN, GERD, OSA, type II DM, bilateral TKA  are also affecting patient's functional outcome.   REHAB POTENTIAL: Good  CLINICAL DECISION MAKING: Evolving/moderate complexity  EVALUATION COMPLEXITY: Moderate   GOALS: Goals reviewed with patient? Yes  SHORT TERM GOALS: Target date: 04/13/2023    Patient will be independent in home exercise program to improve strength/mobility for better functional independence with ADLs. Baseline: 2/12: given to patient 4/1: compliant Goal status: MET  2.  .  Patient will report a worst pain of  5/10 on VAS in low back to improve tolerance with ADLs and reduced symptoms with activities. Baseline: 2/12: 10/10 4/1: 6/10  Goal status: Partially Met    LONG TERM GOALS: Target date: 05/11/2023    Patient will increase FOTO score to equal to or greater than 48%    to demonstrate statistically significant improvement in mobility and quality of life.  Baseline: 2/12: 36% 4/1: 37% Goal status: Partially Met  2.  Patient will report a worst pain of 3/10 on VAS in low back to improve tolerance with ADLs and reduced symptoms with activities.  Baseline: 2/12: 10/10 4/1: 6/10  Goal status: Partially Met  3.  Patient will increase 10 meter walk test to >1.57m/s as to improve gait speed for better community ambulation and to reduce fall risk Baseline: 2/12: unable to ambulate 10 meters 4/1: 17.98 seconds with SPC  Goal status: Partially Met   4.  Patient (> 60 years old) will complete five times sit to stand test in < 15 seconds indicating an increased LE strength and improved balance. Baseline: 2/12: unable to perform due to severe pain 4/1: 21 seconds with BUE support Goal status: Partially Met    PLAN:  PT FREQUENCY: 2x/week  PT DURATION: 8 weeks  PLANNED INTERVENTIONS: Therapeutic exercises, Therapeutic activity, Neuromuscular re-education, Balance training, Gait training, Patient/Family education, Self Care,  Joint mobilization, Stair training, Vestibular training, Canalith repositioning, Orthotic/Fit training, DME instructions, Electrical stimulation, Spinal manipulation, Spinal mobilization, Cryotherapy, Moist heat, Splintting, Taping, Vasopneumatic device, Traction, Ultrasound, Manual therapy, and Re-evaluation.  PLAN FOR NEXT SESSION:  Reassess tolerance for LE strengthening  Precious Bard PT  Physical Therapist- [facility]   04/20/23, 1:03 PM

## 2023-04-17 ENCOUNTER — Telehealth: Payer: Self-pay

## 2023-04-17 ENCOUNTER — Inpatient Hospital Stay: Payer: Medicare Other

## 2023-04-17 VITALS — BP 122/74 | HR 95 | Temp 97.7°F | Resp 16

## 2023-04-17 DIAGNOSIS — Z5189 Encounter for other specified aftercare: Secondary | ICD-10-CM | POA: Diagnosis not present

## 2023-04-17 DIAGNOSIS — Z5111 Encounter for antineoplastic chemotherapy: Secondary | ICD-10-CM | POA: Diagnosis not present

## 2023-04-17 DIAGNOSIS — C787 Secondary malignant neoplasm of liver and intrahepatic bile duct: Secondary | ICD-10-CM | POA: Diagnosis not present

## 2023-04-17 DIAGNOSIS — C3411 Malignant neoplasm of upper lobe, right bronchus or lung: Secondary | ICD-10-CM

## 2023-04-17 DIAGNOSIS — Z452 Encounter for adjustment and management of vascular access device: Secondary | ICD-10-CM | POA: Diagnosis not present

## 2023-04-17 DIAGNOSIS — D61810 Antineoplastic chemotherapy induced pancytopenia: Secondary | ICD-10-CM | POA: Diagnosis not present

## 2023-04-17 MED ORDER — HEPARIN SOD (PORK) LOCK FLUSH 100 UNIT/ML IV SOLN
500.0000 [IU] | Freq: Once | INTRAVENOUS | Status: AC | PRN
  Administered 2023-04-17: 500 [IU]

## 2023-04-17 MED ORDER — SODIUM CHLORIDE 0.9 % IV SOLN
10.0000 mg | Freq: Once | INTRAVENOUS | Status: AC
  Administered 2023-04-17: 10 mg via INTRAVENOUS
  Filled 2023-04-17: qty 10

## 2023-04-17 MED ORDER — TRILACICLIB DIHYDROCHLORIDE INJECTION 300 MG
240.0000 mg/m2 | Freq: Once | INTRAVENOUS | Status: AC
  Administered 2023-04-17: 600 mg via INTRAVENOUS
  Filled 2023-04-17: qty 40

## 2023-04-17 MED ORDER — PEGFILGRASTIM 6 MG/0.6ML ~~LOC~~ PSKT
6.0000 mg | PREFILLED_SYRINGE | Freq: Once | SUBCUTANEOUS | Status: AC
  Administered 2023-04-17: 6 mg via SUBCUTANEOUS
  Filled 2023-04-17: qty 0.6

## 2023-04-17 MED ORDER — SODIUM CHLORIDE 0.9% FLUSH
10.0000 mL | INTRAVENOUS | Status: DC | PRN
  Administered 2023-04-17: 10 mL

## 2023-04-17 MED ORDER — SODIUM CHLORIDE 0.9 % IV SOLN
80.0000 mg/m2 | Freq: Once | INTRAVENOUS | Status: AC
  Administered 2023-04-17: 200 mg via INTRAVENOUS
  Filled 2023-04-17: qty 10

## 2023-04-17 MED ORDER — SODIUM CHLORIDE 0.9 % IV SOLN: Freq: Once | INTRAVENOUS | Status: AC

## 2023-04-17 NOTE — Patient Instructions (Signed)
[facility]  Discharge Instructions: Thank you for choosing [facility] to provide your oncology and hematology care.   If you have a lab appointment with the Cancer Center, please go directly to the Cancer Center and check in at the registration area.   Wear comfortable clothing and clothing appropriate for easy access to any Portacath or PICC line.   We strive to give you quality time with your provider. You may need to reschedule your appointment if you arrive late (15 or more minutes).  Arriving late affects you and other patients whose appointments are after yours.  Also, if you miss three or more appointments without notifying the office, you may be dismissed from the clinic at the provider's discretion.      For prescription refill requests, have your pharmacy contact our office and allow 72 hours for refills to be completed.    Today you received the following chemotherapy and/or immunotherapy agents cosela, etoposide      To help prevent nausea and vomiting after your treatment, we encourage you to take your nausea medication as directed.  BELOW ARE SYMPTOMS THAT SHOULD BE REPORTED IMMEDIATELY: *FEVER GREATER THAN 100.4 F (38 C) OR HIGHER *CHILLS OR SWEATING *NAUSEA AND VOMITING THAT IS NOT CONTROLLED WITH YOUR NAUSEA MEDICATION *UNUSUAL SHORTNESS OF BREATH *UNUSUAL BRUISING OR BLEEDING *URINARY PROBLEMS (pain or burning when urinating, or frequent urination) *BOWEL PROBLEMS (unusual diarrhea, constipation, pain near the anus) TENDERNESS IN MOUTH AND THROAT WITH OR WITHOUT PRESENCE OF ULCERS (sore throat, sores in mouth, or a toothache) UNUSUAL RASH, SWELLING OR PAIN  UNUSUAL VAGINAL DISCHARGE OR ITCHING   Items with * indicate a potential emergency and should be followed up as soon as possible or go to the Emergency Department if any problems should occur.  Please show the CHEMOTHERAPY ALERT CARD or IMMUNOTHERAPY ALERT CARD  at check-in to the Emergency Department and triage nurse.  Should you have questions after your visit or need to cancel or reschedule your appointment, please contact  [facility]  Dept: [phone]  and follow the prompts.  Office hours are 8:00 a.m. to 4:30 p.m. Monday - Friday. Please note that voicemails left after 4:00 p.m. may not be returned until the following business day.  We are closed weekends and major holidays. You have access to a nurse at all times for urgent questions. Please call the main number to the clinic Dept: [phone] and follow the prompts.   For any non-urgent questions, you may also contact your provider using MyChart. We now offer e-Visits for anyone 18 and older to request care online for non-urgent symptoms. For details visit [email or web address].   Also download the MyChart app! Go to the app store, search "MyChart", open the app, select , and log in with your MyChart username and password.  

## 2023-04-17 NOTE — Telephone Encounter (Signed)
This nurse reached out to this patient, spoke with patients wife and made aware that patients urinalysis was negative for infection, blood and bilirubin.  No further recommendations at this time.  She acknowledged understanding and has no questions or concerns at this time.

## 2023-04-17 NOTE — Telephone Encounter (Signed)
-----   Message from Kellogg, PA-C sent at 04/15/2023  3:41 PM EDT ----- Tell him no infection, no blood in urine, and no bilirubin.  ----- Message ----- From: Interface, Lab In [facility] Sent: 04/15/2023   3:24 PM EDT To: Johnette Abraham Heilingoetter, PA-C

## 2023-04-20 ENCOUNTER — Ambulatory Visit: Payer: Medicare Other

## 2023-04-20 DIAGNOSIS — M5459 Other low back pain: Secondary | ICD-10-CM | POA: Diagnosis not present

## 2023-04-20 DIAGNOSIS — M6281 Muscle weakness (generalized): Secondary | ICD-10-CM | POA: Diagnosis not present

## 2023-04-20 DIAGNOSIS — R262 Difficulty in walking, not elsewhere classified: Secondary | ICD-10-CM

## 2023-04-21 ENCOUNTER — Inpatient Hospital Stay: Payer: Medicare Other

## 2023-04-21 VITALS — BP 116/80 | HR 90 | Temp 98.7°F | Resp 17

## 2023-04-21 DIAGNOSIS — C3411 Malignant neoplasm of upper lobe, right bronchus or lung: Secondary | ICD-10-CM | POA: Diagnosis not present

## 2023-04-21 DIAGNOSIS — Z452 Encounter for adjustment and management of vascular access device: Secondary | ICD-10-CM | POA: Diagnosis not present

## 2023-04-21 DIAGNOSIS — C787 Secondary malignant neoplasm of liver and intrahepatic bile duct: Secondary | ICD-10-CM | POA: Diagnosis not present

## 2023-04-21 DIAGNOSIS — Z5111 Encounter for antineoplastic chemotherapy: Secondary | ICD-10-CM | POA: Diagnosis not present

## 2023-04-21 DIAGNOSIS — Z5189 Encounter for other specified aftercare: Secondary | ICD-10-CM | POA: Diagnosis not present

## 2023-04-21 DIAGNOSIS — D61810 Antineoplastic chemotherapy induced pancytopenia: Secondary | ICD-10-CM | POA: Diagnosis not present

## 2023-04-21 DIAGNOSIS — Z95828 Presence of other vascular implants and grafts: Secondary | ICD-10-CM

## 2023-04-21 DIAGNOSIS — D6481 Anemia due to antineoplastic chemotherapy: Secondary | ICD-10-CM

## 2023-04-21 LAB — CBC WITH DIFFERENTIAL (CANCER CENTER ONLY)
Abs Immature Granulocytes: 0 10*3/uL (ref 0.00–0.07)
Basophils Absolute: 0 10*3/uL (ref 0.0–0.1)
Basophils Relative: 0 %
Eosinophils Absolute: 0 10*3/uL (ref 0.0–0.5)
Eosinophils Relative: 0 %
HCT: 25.3 % — ABNORMAL LOW (ref 39.0–52.0)
Hemoglobin: 8.3 g/dL — ABNORMAL LOW (ref 13.0–17.0)
Lymphocytes Relative: 5 %
Lymphs Abs: 0.8 10*3/uL (ref 0.7–4.0)
MCH: 33.7 pg (ref 26.0–34.0)
MCHC: 32.8 g/dL (ref 30.0–36.0)
MCV: 102.8 fL — ABNORMAL HIGH (ref 80.0–100.0)
Monocytes Absolute: 0.2 10*3/uL (ref 0.1–1.0)
Monocytes Relative: 1 %
Neutro Abs: 14.8 10*3/uL — ABNORMAL HIGH (ref 1.7–7.7)
Neutrophils Relative %: 94 %
Platelet Count: 100 10*3/uL — ABNORMAL LOW (ref 150–400)
RBC: 2.46 MIL/uL — ABNORMAL LOW (ref 4.22–5.81)
RDW: 17.3 % — ABNORMAL HIGH (ref 11.5–15.5)
Smear Review: NORMAL
WBC Count: 15.7 10*3/uL — ABNORMAL HIGH (ref 4.0–10.5)
nRBC: 0 % (ref 0.0–0.2)

## 2023-04-21 LAB — CMP (CANCER CENTER ONLY)
ALT: 23 U/L (ref 0–44)
AST: 27 U/L (ref 15–41)
Albumin: 3.1 g/dL — ABNORMAL LOW (ref 3.5–5.0)
Alkaline Phosphatase: 105 U/L (ref 38–126)
Anion gap: 10 (ref 5–15)
BUN: 24 mg/dL — ABNORMAL HIGH (ref 8–23)
CO2: 25 mmol/L (ref 22–32)
Calcium: 8.1 mg/dL — ABNORMAL LOW (ref 8.9–10.3)
Chloride: 100 mmol/L (ref 98–111)
Creatinine: 1.08 mg/dL (ref 0.61–1.24)
GFR, Estimated: >60 mL/min (ref >60)
Glucose, Bld: 198 mg/dL — ABNORMAL HIGH (ref 70–99)
Potassium: 4 mmol/L (ref 3.5–5.1)
Sodium: 135 mmol/L (ref 135–145)
Total Bilirubin: 0.5 mg/dL (ref 0.3–1.2)
Total Protein: 6.3 g/dL — ABNORMAL LOW (ref 6.5–8.1)

## 2023-04-21 LAB — SAMPLE TO BLOOD BANK

## 2023-04-21 MED ORDER — HEPARIN SOD (PORK) LOCK FLUSH 100 UNIT/ML IV SOLN
500.0000 [IU] | Freq: Once | INTRAVENOUS | Status: AC
  Administered 2023-04-21: 500 [IU]

## 2023-04-21 MED ORDER — SODIUM CHLORIDE 0.9% FLUSH
10.0000 mL | Freq: Once | INTRAVENOUS | Status: AC
  Administered 2023-04-21: 10 mL

## 2023-04-22 NOTE — Therapy (Signed)
OUTPATIENT PHYSICAL THERAPY THORACOLUMBAR TREATMENT   Patient Name: Adam Wise MRN: [id number] DOB:Jan 28, 1951, 72 y.o., male Today's Date: 04/23/2023  END OF SESSION:  PT End of Session - 04/23/23 1056     Visit Number 19    Number of Visits 26    Date for PT Re-Evaluation 05/11/23    PT Start Time 1057    PT Stop Time 1143    PT Time Calculation (min) 46 min    Activity Tolerance Patient tolerated treatment well;No increased pain    Behavior During Therapy [facility] for tasks assessed/performed                            Past Medical History:  Diagnosis Date   Chickenpox    Chronic knee pain    Chronic low back pain    COPD exacerbation (HCC) 10/11/2021   COPD with exacerbation (HCC) 10/12/2021   Coronary artery calcification seen on CAT scan 11/2021   Coronary CTA 11/27/2021: Moderate to large right pleural effusion and compressive atelectasis right lung base. => Coronary Calcium Score 657.  Diffuse RCA calcification.  Minimal mild disease in the LAD and diagonal branches. == Overall limited study.  Notable artifact.   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 He has disease progression after the first line of chemotherapy in December 2021.   Testosterone deficiency    Type 2 diabetes mellitus (HCC)    Past Surgical History:  Procedure Laterality Date   CHEST TUBE INSERTION Right 01/20/2022   Procedure: CHEST TUBE INSERTION;  Surgeon: Lorin Glass, MD;  Location: [facility] ENDOSCOPY;  Service: Pulmonary;  Laterality: Right;  w/ Talc Pleurodesis, planned admit for  Obs afterwards   CHEST TUBE INSERTION Right 04/28/2022   Procedure: INSERTION PLEURAL DRAINAGE CATHETER - Pigtail tail, drainage;  Surgeon: Lorin Glass, MD;  Location: [facility] ENDOSCOPY;  Service: Pulmonary;  Laterality: Right;  talc pleurodesis   CHEST TUBE INSERTION Right 09/01/2022   Procedure: INSERTION PLEURAL DRAINAGE CATHETER;  Surgeon: Lupita Leash, MD;  Location: [facility] ENDOSCOPY;  Service: Cardiopulmonary;  Laterality: Right;  pigtail catheter placement with talc pleurodesis   COLONOSCOPY WITH PROPOFOL N/A 12/17/2018   Procedure: COLONOSCOPY WITH PROPOFOL;  Surgeon: Pasty Spillers, MD;  Location: [facility] ENDOSCOPY;  Service: Gastroenterology;  Laterality: N/A;   IR IMAGING GUIDED PORT INSERTION  04/17/2020   IR THORACENTESIS ASP PLEURAL SPACE W/IMG GUIDE  11/29/2021   IR THORACENTESIS ASP PLEURAL SPACE W/IMG GUIDE  01/03/2022   JOINT REPLACEMENT Bilateral    REPLACEMENT TOTAL KNEE BILATERAL  2015   TALC PLEURODESIS  09/01/2022   Procedure: Lurlean Nanny;  Surgeon: Lupita Leash, MD;  Location: [facility] ENDOSCOPY;  Service: Cardiopulmonary;;   TONSILLECTOMY  1960   TRANSTHORACIC ECHOCARDIOGRAM  05/2020   a) 05/2020: EF 55 to 60%.  No R WMA.  Mild LVH.  Indeterminate LVEDP.  Unable to assess RVP.  Normal aortic and mitral valves.  Mildly elevated RAP.; b) 09/2021: EF 50-55%. No RWMA. Mild LVH. ~ LVEDP. Mild LA Dilation. NORMAL RV/RAP.  Normal MV/AoV.  Patient Active Problem List   Diagnosis Date Noted   Hip pain 12/09/2022   Dizziness 11/12/2022   Chronic respiratory failure with hypoxia (HCC) 09/18/2022   Pneumothorax 09/02/2022   Pneumothorax on right 09/01/2022   Recurrent pleural effusion 08/27/2022   Recurrent pleural effusion on right 01/16/2022   Preop cardiovascular exam 12/19/2021   Coronary Calcium Score 657 11/13/2021   Atypical chest pain 11/13/2021   Pleural effusion 10/29/2021   Acute on chronic respiratory failure with hypoxia (HCC) 10/12/2021   Recurrent  right pleural effusion 08/08/2021   Erectile dysfunction 10/26/2020   Port-A-Cath in place 06/11/2020   History of pulmonary embolism 05/15/2020   Small cell lung cancer, right upper lobe (HCC) 04/12/2020   Chronic fatigue 04/12/2020   Encounter for antineoplastic chemotherapy 04/12/2020   Encounter for antineoplastic immunotherapy 04/12/2020   Malignant neoplasm of upper lobe of right lung (HCC) 04/04/2020   Goals of care, counseling/discussion 03/30/2020   Exertional dyspnea 03/26/2020   Hyperlipidemia associated with type 2 diabetes mellitus (HCC) 11/08/2019   Joint swelling 11/16/2018   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   Essential hypertension 09/09/2017   Type 2 diabetes mellitus (HCC) 09/09/2017   GERD (gastroesophageal reflux disease) 09/09/2017   Testosterone deficiency 09/09/2017   OSA (obstructive sleep apnea) 09/09/2017   Chronic bilateral low back pain without sciatica 09/09/2017   H/O knee surgery 04/27/2014   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    PCP: Doreene Nest NP   REFERRING PROVIDER: Doreene Nest NP   REFERRING DIAG: chronic bilateral low back pain without sciatica   Rationale for Evaluation and Treatment: Rehabilitation  THERAPY DIAG:  Other low back pain  Difficulty in walking, not elsewhere classified  Muscle weakness (generalized)  ONSET DATE: December 2023  SUBJECTIVE:                                                                                                                                                                                           SUBJECTIVE STATEMENT: Patient reports today is a bad day, woke up in a lot of pain.    PERTINENT HISTORY:  Patient is a very pleasant 72 year old male presenting to physical therapy for chronic low back pain without sciatica. Patient has been seen by this therapist in the past. Patient additionally has relapsed extensive stage small cell lung cancer and now multiple  metastatic liver lesions diagnosed in April 2021 with history of radiotherapy and chemotherapy. PMH includes chickenpox COPD, CAD, HTN, GERD, OSA, type II DM, bilateral TKA. Patient tried figure four and stomach pressing up. Also having vertigo.   PAIN:  Are you having pain?  7/10 pain Yes: NPRS scale: worst pain: 10/10; least amount of pain 4/10 current pain 6-7/10 Pain location: R sided low back pain radiating to back of knee; sometimes feeling like wet pants of adduction. Pain description: radiating to back pain Aggravating factors: getting up,  Relieving factors: laying down in bed, bending over the sink  PRECAUTIONS: Other: back, cancer   WEIGHT BEARING RESTRICTIONS: No  FALLS:  Has patient fallen in last 6 months? No  LIVING ENVIRONMENT: Lives with: lives with their spouse Lives in: House/apartment Stairs: Yes: Internal: flight steps; on right going up and on left going up and External: 5 steps; on right going up and on left going up Has following equipment at home: Single point cane, Environmental consultant - 4 wheeled, Tour manager, and Grab bars  OCCUPATION: retired Theatre stage manager  PLOF: Independent  PATIENT GOALS: to have less pain  NEXT MD VISIT: n/a  OBJECTIVE:   DIAGNOSTIC FINDINGS:  MR lumbar 2020 Advanced disc degeneration T12-L1 with mild spinal stenosis   Mild spinal stenosis L2-3   Moderate spinal stenosis L3-4 and L4-5  PATIENT SURVEYS:  FOTO 36  SCREENING FOR RED FLAGS: Bowel or bladder incontinence: No Spinal tumors: No Cauda equina syndrome: No Compression fracture: No Abdominal aneurysm: No  COGNITION: Overall cognitive status: Within functional limits for tasks assessed     SENSATION: Hot/Cold: Impaired R medial knee  MUSCLE LENGTH: Hamstrings: Right 25 deg; Left 40 deg   POSTURE: rounded shoulders, increased thoracic kyphosis, posterior pelvic tilt, and flexed trunk   PALPATION: Excessive muscle guarding along lumbar paraspinals and piriformis    LUMBAR ROM:   AROM eval  Flexion limited  Extension limited  Right lateral flexion limited  Left lateral flexion limited  Right rotation limited  Left rotation limited   (Blank rows = not tested)    LOWER EXTREMITY MMT:    MMT Right eval Left eval  Hip flexion 2+* 3  Hip extension    Hip abduction 3* 3  Hip adduction 3-* 3  Knee flexion 3-* 3  Knee extension 3 3  Ankle dorsiflexion 4 4  Ankle plantarflexion 4 4   (Blank rows = not tested)  LUMBAR SPECIAL TESTS:  Straight leg raise test: Positive, SI Compression/distraction test: Positive, FABER test: Negative, and scour negative; distraction positive   FUNCTIONAL TESTS:  5 times sit to stand: unable to perform due to pain 10 meter walk test: unable to perform due to pain Sit to stand: CGA due to extreme pain Supine,<>prone; slow, extra time due to pain  GAIT: Distance walked: 3 ft Assistive device utilized: Single point cane Level of assistance: CGA Comments: severe pain with walking  TODAY'S TREATMENT:                                                                                                                              DATE: 04/23/23 There.ex:  Seated: GTB hamstring curl 15x each LE GTB pf 15x each L  Supine:  1.5 lb ankle weights: march 10x each LE x 2 sets GTB abduction 20x   Manual:x2 sets each hold Hamstring stretch 60 seconds each LE IT band stretch 60 seconds each LE  Single knee to chest 60 seconds each LE Piriformis stretch 60 seconds each LE Roller to R IT band and glute x  6 minutes SAD lateral with belt RLE 4x30 seconds   PATIENT EDUCATION:  Education details: POC, anterior pelvic stretch Person educated: Patient and Spouse Education method: Explanation, Demonstration, Tactile cues, and Verbal cues Education comprehension: verbalized understanding, returned demonstration, verbal cues required, and tactile cues required  HOME EXERCISE PROGRAM: Anterior pelvic tilt  10x Hamstring stretch 30 seconds Lay prone 60 seconds   Access Code: [email or web address] URL: [email or web address] Date: 01/29/2023 Prepared by: Precious Bard  Exercises - Seated Anterior Pelvic Tilt  - 1 x daily - 7 x weekly - 2 sets - 10 reps - 5 hold - Supine Anterior Pelvic Tilt  - 1 x daily - 7 x weekly - 2 sets - 10 reps - 5 hold - Seated Hamstring Stretch  - 1 x daily - 7 x weekly - 2 sets - 2 reps - 30 hold - Supine Transversus Abdominis Bracing - Hands on Ground  - 1 x daily - 7 x weekly - 2 sets - 10 reps - 5 hold  ASSESSMENT:  CLINICAL IMPRESSION: Patient initially presents with increased pain, pain reduced with roller and distraction with belt. Patient is eager to progress pain free mobility and strength. Patient is able to tolerate hamstring strengthening as well a use of ankle weights this session.   Pt will benefit from skilled PT services to address deficits in order to improve overall QoL and return to PLOF.      OBJECTIVE IMPAIRMENTS: Abnormal gait, cardiopulmonary status limiting activity, decreased activity tolerance, decreased balance, decreased endurance, decreased mobility, difficulty walking, decreased ROM, decreased strength, hypomobility, impaired perceived functional ability, increased muscle spasms, impaired flexibility, impaired sensation, improper body mechanics, postural dysfunction, obesity, and pain.   ACTIVITY LIMITATIONS: carrying, lifting, bending, sitting, standing, squatting, sleeping, stairs, transfers, bed mobility, bathing, toileting, dressing, reach over head, hygiene/grooming, and locomotion level  PARTICIPATION LIMITATIONS: meal prep, cleaning, laundry, medication management, personal finances, interpersonal relationship, driving, shopping, community activity, occupation, yard work, and church  PERSONAL FACTORS: Age, Behavior pattern, Education, Financial risk analyst, Past/current experiences, Profession, Sex, Social background, Time since onset of  injury/illness/exacerbation, and 3+ comorbidities: chickenpox COPD, CAD, HTN, GERD, OSA, type II DM, bilateral TKA  are also affecting patient's functional outcome.   REHAB POTENTIAL: Good  CLINICAL DECISION MAKING: Evolving/moderate complexity  EVALUATION COMPLEXITY: Moderate   GOALS: Goals reviewed with patient? Yes  SHORT TERM GOALS: Target date: 04/13/2023    Patient will be independent in home exercise program to improve strength/mobility for better functional independence with ADLs. Baseline: 2/12: given to patient 4/1: compliant Goal status: MET  2.  .  Patient will report a worst pain of  5/10 on VAS in low back to improve tolerance with ADLs and reduced symptoms with activities. Baseline: 2/12: 10/10 4/1: 6/10  Goal status: Partially Met    LONG TERM GOALS: Target date: 05/11/2023    Patient will increase FOTO score to equal to or greater than 48%    to demonstrate statistically significant improvement in mobility and quality of life.  Baseline: 2/12: 36% 4/1: 37% Goal status: Partially Met  2.  Patient will report a worst pain of 3/10 on VAS in low back to  improve tolerance with ADLs and reduced symptoms with activities.  Baseline: 2/12: 10/10 4/1: 6/10  Goal status: Partially Met  3.  Patient will increase 10 meter walk test to >1.64m/s as to improve gait speed for better community ambulation and to reduce fall risk Baseline: 2/12: unable to ambulate 10 meters 4/1: 17.98 seconds with SPC  Goal status: Partially Met   4.  Patient (> 81 years old) will complete five times sit to stand test in < 15 seconds indicating an increased LE strength and improved balance. Baseline: 2/12: unable to perform due to severe pain 4/1: 21 seconds with BUE support Goal status: Partially Met    PLAN:  PT FREQUENCY: 2x/week  PT DURATION: 8 weeks  PLANNED INTERVENTIONS: Therapeutic exercises, Therapeutic activity, Neuromuscular re-education, Balance training, Gait training,  Patient/Family education, Self Care, Joint mobilization, Stair training, Vestibular training, Canalith repositioning, Orthotic/Fit training, DME instructions, Electrical stimulation, Spinal manipulation, Spinal mobilization, Cryotherapy, Moist heat, Splintting, Taping, Vasopneumatic device, Traction, Ultrasound, Manual therapy, and Re-evaluation.  PLAN FOR NEXT SESSION:  Reassess tolerance for LE strengthening  Precious Bard PT  Physical Therapist- [facility]   04/23/23, 12:29 PM

## 2023-04-23 ENCOUNTER — Ambulatory Visit: Payer: Medicare Other

## 2023-04-23 DIAGNOSIS — M6281 Muscle weakness (generalized): Secondary | ICD-10-CM

## 2023-04-23 DIAGNOSIS — R262 Difficulty in walking, not elsewhere classified: Secondary | ICD-10-CM

## 2023-04-23 DIAGNOSIS — M5459 Other low back pain: Secondary | ICD-10-CM

## 2023-04-27 ENCOUNTER — Ambulatory Visit: Payer: Medicare Other

## 2023-04-27 DIAGNOSIS — M5459 Other low back pain: Secondary | ICD-10-CM

## 2023-04-27 DIAGNOSIS — R262 Difficulty in walking, not elsewhere classified: Secondary | ICD-10-CM | POA: Diagnosis not present

## 2023-04-27 DIAGNOSIS — M6281 Muscle weakness (generalized): Secondary | ICD-10-CM

## 2023-04-27 NOTE — Therapy (Unsigned)
OUTPATIENT PHYSICAL THERAPY THORACOLUMBAR TREATMENT Physical Therapy Progress Note Dates of reporting period  03/16/23   to   04/27/23    Patient Name: Adam Wise MRN: [id number] DOB:06/09/1951, 72 y.o., male Today's Date: 04/27/2023  END OF SESSION:  PT End of Session - 04/27/23 1024     Visit Number 20    Number of Visits 26    Date for PT Re-Evaluation 05/11/23    PT Start Time 1017    PT Stop Time 1057    PT Time Calculation (min) 40 min    Activity Tolerance Patient tolerated treatment well;No increased pain    Behavior During Therapy [facility] for tasks assessed/performed                    Past Medical History:  Diagnosis Date   Chickenpox    Chronic knee pain    Chronic low back pain    COPD exacerbation (HCC) 10/11/2021   COPD with exacerbation (HCC) 10/12/2021   Coronary artery calcification seen on CAT scan 11/2021   Coronary CTA 11/27/2021: Moderate to large right pleural effusion and compressive atelectasis right lung base. => Coronary Calcium Score 657.  Diffuse RCA calcification.  Minimal mild disease in the LAD and diagonal branches. == Overall limited study.  Notable artifact.   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 He has disease progression after the first line of chemotherapy in December 2021.   Testosterone deficiency    Type 2 diabetes mellitus (HCC)    Past Surgical History:  Procedure Laterality Date   CHEST TUBE INSERTION Right 01/20/2022   Procedure: CHEST TUBE INSERTION;  Surgeon: Lorin Glass, MD;  Location: [facility] ENDOSCOPY;  Service:  Pulmonary;  Laterality: Right;  w/ Talc Pleurodesis, planned admit for Obs afterwards   CHEST TUBE INSERTION Right 04/28/2022   Procedure: INSERTION PLEURAL DRAINAGE CATHETER - Pigtail tail, drainage;  Surgeon: Lorin Glass, MD;  Location: [facility] ENDOSCOPY;  Service: Pulmonary;  Laterality: Right;  talc pleurodesis   CHEST TUBE INSERTION Right 09/01/2022   Procedure: INSERTION PLEURAL DRAINAGE CATHETER;  Surgeon: Lupita Leash, MD;  Location: [facility] ENDOSCOPY;  Service: Cardiopulmonary;  Laterality: Right;  pigtail catheter placement with talc pleurodesis   COLONOSCOPY WITH PROPOFOL N/A 12/17/2018   Procedure: COLONOSCOPY WITH PROPOFOL;  Surgeon: Pasty Spillers, MD;  Location: [facility] ENDOSCOPY;  Service: Gastroenterology;  Laterality: N/A;   IR IMAGING GUIDED PORT INSERTION  04/17/2020   IR THORACENTESIS ASP PLEURAL SPACE W/IMG GUIDE  11/29/2021   IR THORACENTESIS ASP PLEURAL SPACE W/IMG GUIDE  01/03/2022   JOINT REPLACEMENT Bilateral    REPLACEMENT TOTAL KNEE BILATERAL  2015   TALC PLEURODESIS  09/01/2022   Procedure: Lurlean Nanny;  Surgeon: Lupita Leash, MD;  Location: [facility] ENDOSCOPY;  Service: Cardiopulmonary;;   TONSILLECTOMY  1960   TRANSTHORACIC ECHOCARDIOGRAM  05/2020   a) 05/2020: EF 55 to 60%.  No R WMA.  Mild LVH.  Indeterminate LVEDP.  Unable to assess RVP.  Normal aortic and mitral valves.  Mildly elevated RAP.; b) 09/2021: EF 50-55%. No RWMA. Mild LVH. ~ LVEDP. Mild  LA Dilation. NORMAL RV/RAP.  Normal MV/AoV.   Patient Active Problem List   Diagnosis Date Noted   Hip pain 12/09/2022   Dizziness 11/12/2022   Chronic respiratory failure with hypoxia (HCC) 09/18/2022   Pneumothorax 09/02/2022   Pneumothorax on right 09/01/2022   Recurrent pleural effusion 08/27/2022   Recurrent pleural effusion on right 01/16/2022   Preop cardiovascular exam 12/19/2021   Coronary Calcium Score 657 11/13/2021   Atypical chest pain 11/13/2021   Pleural effusion 10/29/2021   Acute on  chronic respiratory failure with hypoxia (HCC) 10/12/2021   Recurrent right pleural effusion 08/08/2021   Erectile dysfunction 10/26/2020   Port-A-Cath in place 06/11/2020   History of pulmonary embolism 05/15/2020   Small cell lung cancer, right upper lobe (HCC) 04/12/2020   Chronic fatigue 04/12/2020   Encounter for antineoplastic chemotherapy 04/12/2020   Encounter for antineoplastic immunotherapy 04/12/2020   Malignant neoplasm of upper lobe of right lung (HCC) 04/04/2020   Goals of care, counseling/discussion 03/30/2020   Exertional dyspnea 03/26/2020   Hyperlipidemia associated with type 2 diabetes mellitus (HCC) 11/08/2019   Joint swelling 11/16/2018   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   Essential hypertension 09/09/2017   Type 2 diabetes mellitus (HCC) 09/09/2017   GERD (gastroesophageal reflux disease) 09/09/2017   Testosterone deficiency 09/09/2017   OSA (obstructive sleep apnea) 09/09/2017   Chronic bilateral low back pain without sciatica 09/09/2017   H/O knee surgery 04/27/2014   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 He says his pain is better today that most days. Pt remarks on how much stronger he has gotten since starting PT this go round. He feel sot be making good progress overall.   PERTINENT HISTORY:  Patient is a very pleasant 72 year old male presenting to physical  therapy for chronic low back pain without sciatica. Patient has been seen by this therapist in the past. Patient additionally has relapsed extensive stage small cell lung cancer and now multiple metastatic liver lesions diagnosed in April 2021 with history of radiotherapy and chemotherapy. PMH  includes chickenpox COPD, CAD, HTN, GERD, OSA, type II DM, bilateral TKA. Patient tried figure four and stomach pressing up. Also having vertigo.   PAIN:  Are you having pain? 4/10 pain  PRECAUTIONS: Other: back, cancer  , falls   WEIGHT BEARING RESTRICTIONS: No  FALLS:  Has patient fallen in last 6 months? No  OCCUPATION: retired Theatre stage manager  PLOF: Independent  PATIENT GOALS: to have less pain  NEXT MD VISIT: n/a  OBJECTIVE:     TODAY'S TREATMENT:                                                                                                                              DATE: 04/27/23  Hamstring stretch 2x60 seconds each LE (seated with a 6" step)  Single knee to chest 2x60 seconds each LE (seated with a box)  Hooklying FABER Stretch 2x60secH  2lb ankle weights, march 10x each LE x 2 sets Hooklying GreenTB Clams 2x15  2lb ankle weights, march 10x each LE x 2 sets Seated- GTB hamstring curl 2x15 BLE    PATIENT EDUCATION:  Assisted with modifications for stretches today to have more options for sitting activities and fewer needs to be in supine.    HOME EXERCISE PROGRAM: Anterior pelvic tilt 10x Hamstring stretch 30 seconds Lay prone 60 seconds   Access Code: [email or web address] URL: [email or web address] Date: 01/29/2023 Prepared by: Precious Bard  Exercises - Seated Anterior Pelvic Tilt  - 1 x daily - 7 x weekly - 2 sets - 10 reps - 5 hold - Supine Anterior Pelvic Tilt  - 1 x daily - 7 x weekly - 2 sets - 10 reps - 5 hold - Seated Hamstring Stretch  - 1 x daily - 7 x weekly - 2 sets - 2 reps - 30 hold - Supine Transversus Abdominis Bracing - Hands on Ground  - 1 x  daily - 7 x weekly - 2 sets - 10 reps - 5 hold  ASSESSMENT:  CLINICAL IMPRESSION: Pt continues to remain motivated and diligent. Ankle weight resistance is increased this date by 33% indicative of improved strength since beginning these exercises. Pt continues to demonstrate modI supine to/from sitting with excellent form as previously shown. Patient is eager to progress pain free mobility and strength. Pt has met all goals of treatment as of April 1. Pt will benefit from skilled PT services to address deficits in order to improve overall QoL and return to PLOF.     OBJECTIVE IMPAIRMENTS: Abnormal gait, cardiopulmonary status limiting activity, decreased activity tolerance, decreased balance, decreased endurance, decreased mobility, difficulty walking, decreased ROM, decreased strength, hypomobility, impaired perceived functional ability, increased muscle spasms, impaired flexibility, impaired sensation, improper body mechanics, postural dysfunction, obesity, and pain.   ACTIVITY LIMITATIONS: carrying, lifting, bending, sitting, standing, squatting, sleeping, stairs, transfers, bed mobility, bathing, toileting, dressing, reach over head, hygiene/grooming, and locomotion level  PARTICIPATION LIMITATIONS: meal prep, cleaning, laundry, medication management, personal finances, interpersonal relationship, driving,  shopping, community activity, occupation, yard work, and church  PERSONAL FACTORS: Age, Behavior pattern, Education, Financial risk analyst, Past/current experiences, Profession, Sex, Social background, Time since onset of injury/illness/exacerbation, and 3+ comorbidities: chickenpox COPD, CAD, HTN, GERD, OSA, type II DM, bilateral TKA  are also affecting patient's functional outcome.   REHAB POTENTIAL: Good  CLINICAL DECISION MAKING: Evolving/moderate complexity  EVALUATION COMPLEXITY: Moderate   GOALS: Goals reviewed with patient? Yes  SHORT TERM GOALS: Target date: 04/13/2023    Patient will  be independent in home exercise program to improve strength/mobility for better functional independence with ADLs. Baseline: 2/12: given to patient 4/1: compliant Goal status: MET  2.  .  Patient will report a worst pain of  5/10 on VAS in low back to improve tolerance with ADLs and reduced symptoms with activities. Baseline: 2/12: 10/10 4/1: 6/10  Goal status: Partially Met    LONG TERM GOALS: Target date: 05/11/2023   Patient will increase FOTO score to equal to or greater than 48%    to demonstrate statistically significant improvement in mobility and quality of life.  Baseline: 2/12: 36% 4/1: 37% Goal status: Partially Met  2.  Patient will report a worst pain of 3/10 on VAS in low back to improve tolerance with ADLs and reduced symptoms with activities.  Baseline: 2/12: 10/10 4/1: 6/10  Goal status: Partially Met  3.  Patient will increase 10 meter walk test to >1.64m/s as to improve gait speed for better community ambulation and to reduce fall risk Baseline: 2/12: unable to ambulate 10 meters 4/1: 17.98 seconds with SPC  Goal status: Partially Met   4.  Patient (> 47 years old) will complete five times sit to stand test in < 15 seconds indicating an increased LE strength and improved balance. Baseline: 2/12: unable to perform due to severe pain 4/1: 21 seconds with BUE support Goal status: Partially Met    PLAN:  PT FREQUENCY: 2x/week  PT DURATION: 8 weeks  PLANNED INTERVENTIONS: Therapeutic exercises, Therapeutic activity, Neuromuscular re-education, Balance training, Gait training, Patient/Family education, Self Care, Joint mobilization, Stair training, Vestibular training, Canalith repositioning, Orthotic/Fit training, DME instructions, Electrical stimulation, Spinal manipulation, Spinal mobilization, Cryotherapy, Moist heat, Splintting, Taping, Vasopneumatic device, Traction, Ultrasound, Manual therapy, and Re-evaluation.  PLAN FOR NEXT SESSION:  Reassess tolerance for  LE strengthening  Rosamaria Lints PT  11:52 AM, 04/27/23 Rosamaria Lints, PT, DPT Physical Therapist - [facility] [facility]  Outpatient Physical Therapy- Main [facility] [phone]     Physical Therapist- [facility]   04/27/23, 10:27 AM

## 2023-04-28 ENCOUNTER — Telehealth: Payer: Self-pay

## 2023-04-28 ENCOUNTER — Ambulatory Visit: Payer: Medicare Other | Admitting: Internal Medicine

## 2023-04-28 ENCOUNTER — Other Ambulatory Visit: Payer: Medicare Other

## 2023-04-28 ENCOUNTER — Other Ambulatory Visit: Payer: Self-pay | Admitting: Medical Oncology

## 2023-04-28 ENCOUNTER — Inpatient Hospital Stay: Payer: Medicare Other

## 2023-04-28 ENCOUNTER — Ambulatory Visit: Payer: Medicare Other

## 2023-04-28 DIAGNOSIS — Z5189 Encounter for other specified aftercare: Secondary | ICD-10-CM | POA: Diagnosis not present

## 2023-04-28 DIAGNOSIS — C3411 Malignant neoplasm of upper lobe, right bronchus or lung: Secondary | ICD-10-CM | POA: Diagnosis not present

## 2023-04-28 DIAGNOSIS — T451X5A Adverse effect of antineoplastic and immunosuppressive drugs, initial encounter: Secondary | ICD-10-CM

## 2023-04-28 DIAGNOSIS — Z5111 Encounter for antineoplastic chemotherapy: Secondary | ICD-10-CM | POA: Diagnosis not present

## 2023-04-28 DIAGNOSIS — Z95828 Presence of other vascular implants and grafts: Secondary | ICD-10-CM

## 2023-04-28 DIAGNOSIS — Z452 Encounter for adjustment and management of vascular access device: Secondary | ICD-10-CM | POA: Diagnosis not present

## 2023-04-28 DIAGNOSIS — D61810 Antineoplastic chemotherapy induced pancytopenia: Secondary | ICD-10-CM | POA: Diagnosis not present

## 2023-04-28 DIAGNOSIS — C787 Secondary malignant neoplasm of liver and intrahepatic bile duct: Secondary | ICD-10-CM | POA: Diagnosis not present

## 2023-04-28 LAB — CBC WITH DIFFERENTIAL (CANCER CENTER ONLY)
Abs Immature Granulocytes: 0.11 10*3/uL — ABNORMAL HIGH (ref 0.00–0.07)
Basophils Absolute: 0.1 10*3/uL (ref 0.0–0.1)
Basophils Relative: 1 %
Eosinophils Absolute: 0 10*3/uL (ref 0.0–0.5)
Eosinophils Relative: 0 %
HCT: 23.5 % — ABNORMAL LOW (ref 39.0–52.0)
Hemoglobin: 7.6 g/dL — ABNORMAL LOW (ref 13.0–17.0)
Immature Granulocytes: 2 %
Lymphocytes Relative: 9 %
Lymphs Abs: 0.6 10*3/uL — ABNORMAL LOW (ref 0.7–4.0)
MCH: 33 pg (ref 26.0–34.0)
MCHC: 32.3 g/dL (ref 30.0–36.0)
MCV: 102.2 fL — ABNORMAL HIGH (ref 80.0–100.0)
Monocytes Absolute: 0.7 10*3/uL (ref 0.1–1.0)
Monocytes Relative: 11 %
Neutro Abs: 5.3 10*3/uL (ref 1.7–7.7)
Neutrophils Relative %: 77 %
Platelet Count: 35 10*3/uL — ABNORMAL LOW (ref 150–400)
RBC: 2.3 MIL/uL — ABNORMAL LOW (ref 4.22–5.81)
RDW: 18 % — ABNORMAL HIGH (ref 11.5–15.5)
Smear Review: NORMAL
WBC Count: 6.8 10*3/uL (ref 4.0–10.5)
nRBC: 0.3 % — ABNORMAL HIGH (ref 0.0–0.2)

## 2023-04-28 LAB — CMP (CANCER CENTER ONLY)
ALT: 17 U/L (ref 0–44)
AST: 18 U/L (ref 15–41)
Albumin: 3.6 g/dL (ref 3.5–5.0)
Alkaline Phosphatase: 102 U/L (ref 38–126)
Anion gap: 9 (ref 5–15)
BUN: 14 mg/dL (ref 8–23)
CO2: 27 mmol/L (ref 22–32)
Calcium: 7.9 mg/dL — ABNORMAL LOW (ref 8.9–10.3)
Chloride: 103 mmol/L (ref 98–111)
Creatinine: 1.04 mg/dL (ref 0.61–1.24)
GFR, Estimated: >60 mL/min (ref >60)
Glucose, Bld: 205 mg/dL — ABNORMAL HIGH (ref 70–99)
Potassium: 4 mmol/L (ref 3.5–5.1)
Sodium: 139 mmol/L (ref 135–145)
Total Bilirubin: 0.5 mg/dL (ref 0.3–1.2)
Total Protein: 6.3 g/dL — ABNORMAL LOW (ref 6.5–8.1)

## 2023-04-28 LAB — SAMPLE TO BLOOD BANK

## 2023-04-28 MED ORDER — SODIUM CHLORIDE 0.9% FLUSH
10.0000 mL | Freq: Once | INTRAVENOUS | Status: AC
  Administered 2023-04-28: 10 mL

## 2023-04-28 MED ORDER — HEPARIN SOD (PORK) LOCK FLUSH 100 UNIT/ML IV SOLN
500.0000 [IU] | Freq: Once | INTRAVENOUS | Status: AC
  Administered 2023-04-28: 500 [IU]

## 2023-04-28 NOTE — Progress Notes (Signed)
Pt notified of appt tomorrow for blood transfusion.

## 2023-04-28 NOTE — Telephone Encounter (Signed)
CRITICAL VALUE STICKER  CRITICAL VALUE:Hg-7.6, Platelets 35  RECEIVER Chell M,CMA  DATE & TIME NOTIFIED:  04/28/2023 11:15am Carola Rhine MD NOTIFIED: [place] Ped  TIME OF NOTIFICATION:  RESPONSE: Platelets to be verified t make sure. Blood Bank tube is being sent to the main lab.

## 2023-04-29 ENCOUNTER — Inpatient Hospital Stay: Payer: Medicare Other

## 2023-04-29 ENCOUNTER — Other Ambulatory Visit: Payer: Self-pay

## 2023-04-29 ENCOUNTER — Ambulatory Visit: Payer: Medicare Other

## 2023-04-29 DIAGNOSIS — Z5189 Encounter for other specified aftercare: Secondary | ICD-10-CM | POA: Diagnosis not present

## 2023-04-29 DIAGNOSIS — Z452 Encounter for adjustment and management of vascular access device: Secondary | ICD-10-CM | POA: Diagnosis not present

## 2023-04-29 DIAGNOSIS — D6481 Anemia due to antineoplastic chemotherapy: Secondary | ICD-10-CM

## 2023-04-29 DIAGNOSIS — Z5111 Encounter for antineoplastic chemotherapy: Secondary | ICD-10-CM | POA: Diagnosis not present

## 2023-04-29 DIAGNOSIS — C787 Secondary malignant neoplasm of liver and intrahepatic bile duct: Secondary | ICD-10-CM | POA: Diagnosis not present

## 2023-04-29 DIAGNOSIS — D61810 Antineoplastic chemotherapy induced pancytopenia: Secondary | ICD-10-CM | POA: Diagnosis not present

## 2023-04-29 DIAGNOSIS — C3411 Malignant neoplasm of upper lobe, right bronchus or lung: Secondary | ICD-10-CM | POA: Diagnosis not present

## 2023-04-29 LAB — BPAM RBC

## 2023-04-29 LAB — TYPE AND SCREEN

## 2023-04-29 MED ORDER — DIPHENHYDRAMINE HCL 25 MG PO CAPS
25.0000 mg | ORAL_CAPSULE | Freq: Once | ORAL | Status: DC
  Filled 2023-04-29: qty 1

## 2023-04-29 MED ORDER — SODIUM CHLORIDE 0.9% FLUSH
10.0000 mL | INTRAVENOUS | Status: AC | PRN
  Administered 2023-04-29: 10 mL

## 2023-04-29 MED ORDER — HEPARIN SOD (PORK) LOCK FLUSH 100 UNIT/ML IV SOLN
500.0000 [IU] | Freq: Every day | INTRAVENOUS | Status: AC | PRN
  Administered 2023-04-29: 500 [IU]

## 2023-04-29 MED ORDER — ACETAMINOPHEN 325 MG PO TABS
650.0000 mg | ORAL_TABLET | Freq: Once | ORAL | Status: AC
  Administered 2023-04-29: 650 mg via ORAL
  Filled 2023-04-29: qty 2

## 2023-04-29 MED ORDER — LORATADINE 10 MG PO TABS
10.0000 mg | ORAL_TABLET | Freq: Every day | ORAL | Status: DC
  Administered 2023-04-29: 10 mg via ORAL
  Filled 2023-04-29: qty 1

## 2023-04-29 MED ORDER — SODIUM CHLORIDE 0.9% IV SOLUTION
250.0000 mL | Freq: Once | INTRAVENOUS | Status: AC
  Administered 2023-04-29: 250 mL via INTRAVENOUS

## 2023-04-29 NOTE — Patient Instructions (Signed)
Blood Transfusion, Adult, Care After The following information offers guidance on how to care for yourself after your procedure. Your health care provider may also give you more specific instructions. If you have problems or questions, contact your health care provider. What can I expect after the procedure? After the procedure, it is common to have: Bruising and soreness where the IV was inserted. A headache. Follow these instructions at home: IV insertion site care     Follow instructions from your health care provider about how to take care of your IV insertion site. Make sure you: Wash your hands with soap and water for at least 20 seconds before and after you change your bandage (dressing). If soap and water are not available, use hand sanitizer. Change your dressing as told by your health care provider. Check your IV insertion site every day for signs of infection. Check for: Redness, swelling, or pain. Bleeding from the site. Warmth. Pus or a bad smell. General instructions Take over-the-counter and prescription medicines only as told by your health care provider. Rest as told by your health care provider. Return to your normal activities as told by your health care provider. Keep all follow-up visits. Lab tests may need to be done at certain periods to recheck your blood counts. Contact a health care provider if: You have itching or red, swollen areas of skin (hives). You have a fever or chills. You have pain in the head, back, or chest. You feel anxious or you feel weak after doing your normal activities. You have redness, swelling, warmth, or pain around the IV insertion site. You have blood coming from the IV insertion site that does not stop with pressure. You have pus or a bad smell coming from your IV insertion site. If you received your blood transfusion in an outpatient setting, you will be told whom to contact to report any reactions. Get help right away if: You  have symptoms of a serious allergic or immune system reaction, including: Trouble breathing or shortness of breath. Swelling of the face, feeling flushed, or widespread rash. Dark urine or blood in the urine. Fast heartbeat. These symptoms may be an emergency. Get help right away. Call 911. Do not wait to see if the symptoms will go away. Do not drive yourself to the hospital. Summary Bruising and soreness around the IV insertion site are common. Check your IV insertion site every day for signs of infection. Rest as told by your health care provider. Return to your normal activities as told by your health care provider. Get help right away for symptoms of a serious allergic or immune system reaction to the blood transfusion. This information is not intended to replace advice given to you by your health care provider. Make sure you discuss any questions you have with your health care provider. Document Revised: 02/28/2022 Document Reviewed: 02/28/2022 Elsevier Patient Education  2023 [employer]  

## 2023-04-29 NOTE — Therapy (Signed)
OUTPATIENT PHYSICAL THERAPY THORACOLUMBAR TREATMENT    Patient Name: Adam Wise MRN: [id number] DOB:10-24-1951, 72 y.o., male Today's Date: 04/30/2023  END OF SESSION:  PT End of Session - 04/30/23 1046     Visit Number 21    Number of Visits 26    Date for PT Re-Evaluation 05/11/23    PT Start Time 1100    PT Stop Time 1144    PT Time Calculation (min) 44 min    Activity Tolerance Patient tolerated treatment well;No increased pain    Behavior During Therapy [facility] for tasks assessed/performed                     Past Medical History:  Diagnosis Date   Chickenpox    Chronic knee pain    Chronic low back pain    COPD exacerbation (HCC) 10/11/2021   COPD with exacerbation (HCC) 10/12/2021   Coronary artery calcification seen on CAT scan 11/2021   Coronary CTA 11/27/2021: Moderate to large right pleural effusion and compressive atelectasis right lung base. => Coronary Calcium Score 657.  Diffuse RCA calcification.  Minimal mild disease in the LAD and diagonal branches. == Overall limited study.  Notable artifact.   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 He has disease progression after the first line of chemotherapy in December 2021.   Testosterone deficiency    Type 2 diabetes mellitus (HCC)    Past Surgical History:  Procedure Laterality Date   CHEST TUBE INSERTION Right 01/20/2022   Procedure: CHEST TUBE INSERTION;  Surgeon: Lorin Glass, MD;  Location: [facility] ENDOSCOPY;  Service: Pulmonary;  Laterality: Right;  w/ Talc Pleurodesis, planned admit for Obs  afterwards   CHEST TUBE INSERTION Right 04/28/2022   Procedure: INSERTION PLEURAL DRAINAGE CATHETER - Pigtail tail, drainage;  Surgeon: Lorin Glass, MD;  Location: [facility] ENDOSCOPY;  Service: Pulmonary;  Laterality: Right;  talc pleurodesis   CHEST TUBE INSERTION Right 09/01/2022   Procedure: INSERTION PLEURAL DRAINAGE CATHETER;  Surgeon: Lupita Leash, MD;  Location: [facility] ENDOSCOPY;  Service: Cardiopulmonary;  Laterality: Right;  pigtail catheter placement with talc pleurodesis   COLONOSCOPY WITH PROPOFOL N/A 12/17/2018   Procedure: COLONOSCOPY WITH PROPOFOL;  Surgeon: Pasty Spillers, MD;  Location: [facility] ENDOSCOPY;  Service: Gastroenterology;  Laterality: N/A;   IR IMAGING GUIDED PORT INSERTION  04/17/2020   IR THORACENTESIS ASP PLEURAL SPACE W/IMG GUIDE  11/29/2021   IR THORACENTESIS ASP PLEURAL SPACE W/IMG GUIDE  01/03/2022   JOINT REPLACEMENT Bilateral    REPLACEMENT TOTAL KNEE BILATERAL  2015   TALC PLEURODESIS  09/01/2022   Procedure: Lurlean Nanny;  Surgeon: Lupita Leash, MD;  Location: [facility] ENDOSCOPY;  Service: Cardiopulmonary;;   TONSILLECTOMY  1960   TRANSTHORACIC ECHOCARDIOGRAM  05/2020   a) 05/2020: EF 55 to 60%.  No R WMA.  Mild LVH.  Indeterminate LVEDP.  Unable to assess RVP.  Normal aortic and mitral valves.  Mildly elevated RAP.; b) 09/2021: EF 50-55%. No RWMA. Mild LVH. ~ LVEDP. Mild LA Dilation. NORMAL RV/RAP.  Normal MV/AoV.   Patient Active Problem List  Diagnosis Date Noted   Hip pain 12/09/2022   Dizziness 11/12/2022   Chronic respiratory failure with hypoxia (HCC) 09/18/2022   Pneumothorax 09/02/2022   Pneumothorax on right 09/01/2022   Recurrent pleural effusion 08/27/2022   Recurrent pleural effusion on right 01/16/2022   Preop cardiovascular exam 12/19/2021   Coronary Calcium Score 657 11/13/2021   Atypical chest pain 11/13/2021   Pleural effusion 10/29/2021   Acute on chronic respiratory failure with hypoxia (HCC) 10/12/2021   Recurrent right  pleural effusion 08/08/2021   Erectile dysfunction 10/26/2020   Port-A-Cath in place 06/11/2020   History of pulmonary embolism 05/15/2020   Small cell lung cancer, right upper lobe (HCC) 04/12/2020   Chronic fatigue 04/12/2020   Encounter for antineoplastic chemotherapy 04/12/2020   Encounter for antineoplastic immunotherapy 04/12/2020   Malignant neoplasm of upper lobe of right lung (HCC) 04/04/2020   Goals of care, counseling/discussion 03/30/2020   Exertional dyspnea 03/26/2020   Hyperlipidemia associated with type 2 diabetes mellitus (HCC) 11/08/2019   Joint swelling 11/16/2018   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   Essential hypertension 09/09/2017   Type 2 diabetes mellitus (HCC) 09/09/2017   GERD (gastroesophageal reflux disease) 09/09/2017   Testosterone deficiency 09/09/2017   OSA (obstructive sleep apnea) 09/09/2017   Chronic bilateral low back pain without sciatica 09/09/2017   H/O knee surgery 04/27/2014   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    PCP: Doreene Nest NP   REFERRING PROVIDER: Doreene Nest NP   REFERRING DIAG: chronic bilateral low back pain without sciatica   Rationale for Evaluation and Treatment: Rehabilitation  THERAPY DIAG:  Other low back pain  Muscle weakness (generalized)  Difficulty in walking, not elsewhere classified  ONSET DATE: December 2023  SUBJECTIVE:                                                                                                                                                                                           SUBJECTIVE STATEMENT: Patient reports Monday went well, had an infusion yesterday.   PERTINENT HISTORY:  Patient is a very pleasant 72 year old male presenting to physical therapy for chronic low back pain without sciatica. Patient has been seen by this therapist in the past. Patient additionally has relapsed extensive stage small cell lung cancer and now multiple metastatic liver  lesions diagnosed in April 2021 with history of radiotherapy and chemotherapy. PMH includes chickenpox COPD, CAD, HTN, GERD, OSA, type II DM, bilateral TKA. Patient tried figure four and stomach pressing up. Also having vertigo.   PAIN:  Are you having pain? 4/10 pain  PRECAUTIONS: Other: back, cancer  , falls  WEIGHT BEARING RESTRICTIONS: No  FALLS:  Has patient fallen in last 6 months? No  OCCUPATION: retired Theatre stage manager  PLOF: Independent  PATIENT GOALS: to have less pain  NEXT MD VISIT: n/a  OBJECTIVE:     TODAY'S TREATMENT:                                                                                                                              DATE: 04/30/23  There.ex:       Seated: 2lb ankle weights: -LAQ modified 10x each LE -ER/IR 10x each LE -heel raise 15x   Supine:  2 lb ankle weights: march 10x each LE x 2 sets GTB abduction 20x x2 sets Green swiss ball TrA activation 10x Posterior pelvic tilt 10x     Manual: Hamstring stretch 60 seconds each LE IT band stretch 60 seconds each LE  Single knee to chest 60 seconds each LE Piriformis stretch 60 seconds each LE Roller to R lumbar spine x  6 minutes SAD lateral with belt RLE 4x30 seconds   PATIENT EDUCATION:  Assisted with modifications for stretches today to have more options for sitting activities and fewer needs to be in supine.    HOME EXERCISE PROGRAM: Anterior pelvic tilt 10x Hamstring stretch 30 seconds Lay prone 60 seconds   Access Code: [email or web address] URL: [email or web address] Date: 01/29/2023 Prepared by: Precious Bard  Exercises - Seated Anterior Pelvic Tilt  - 1 x daily - 7 x weekly - 2 sets - 10 reps - 5 hold - Supine Anterior Pelvic Tilt  - 1 x daily - 7 x weekly - 2 sets - 10 reps - 5 hold - Seated Hamstring Stretch  - 1 x daily - 7 x weekly - 2 sets - 2 reps - 30 hold - Supine Transversus Abdominis Bracing - Hands on Ground  - 1 x daily - 7 x weekly - 2 sets - 10  reps - 5 hold  ASSESSMENT:  CLINICAL IMPRESSION: Patient tolerates progressive strengthening exercises utilizing 2lb ankle weights this session. Patient does have low back pain but his IT band is not involved this session. Utilization of green band continues to be tolerated without pain increase, only fatigue.  Pt will benefit from skilled PT services to address deficits in order to improve overall QoL and return to PLOF.     OBJECTIVE IMPAIRMENTS: Abnormal gait, cardiopulmonary status limiting activity, decreased activity tolerance, decreased balance, decreased endurance, decreased mobility, difficulty walking, decreased ROM, decreased strength, hypomobility, impaired perceived functional ability, increased muscle spasms, impaired flexibility, impaired sensation, improper body mechanics, postural dysfunction, obesity, and pain.   ACTIVITY LIMITATIONS: carrying, lifting, bending, sitting, standing, squatting, sleeping, stairs, transfers, bed mobility, bathing, toileting, dressing, reach over head, hygiene/grooming, and locomotion level  PARTICIPATION LIMITATIONS: meal prep, cleaning, laundry, medication management, personal finances, interpersonal relationship, driving, shopping, community activity, occupation, yard work, and church  PERSONAL FACTORS: Age, Behavior pattern, Education, Fitness, Past/current experiences, Profession, Sex, Social background,  Time since onset of injury/illness/exacerbation, and 3+ comorbidities: chickenpox COPD, CAD, HTN, GERD, OSA, type II DM, bilateral TKA  are also affecting patient's functional outcome.   REHAB POTENTIAL: Good  CLINICAL DECISION MAKING: Evolving/moderate complexity  EVALUATION COMPLEXITY: Moderate   GOALS: Goals reviewed with patient? Yes  SHORT TERM GOALS: Target date: 04/13/2023    Patient will be independent in home exercise program to improve strength/mobility for better functional independence with ADLs. Baseline: 2/12: given to  patient 4/1: compliant Goal status: MET  2.  .  Patient will report a worst pain of  5/10 on VAS in low back to improve tolerance with ADLs and reduced symptoms with activities. Baseline: 2/12: 10/10 4/1: 6/10  Goal status: Partially Met    LONG TERM GOALS: Target date: 05/11/2023   Patient will increase FOTO score to equal to or greater than 48%    to demonstrate statistically significant improvement in mobility and quality of life.  Baseline: 2/12: 36% 4/1: 37% Goal status: Partially Met  2.  Patient will report a worst pain of 3/10 on VAS in low back to improve tolerance with ADLs and reduced symptoms with activities.  Baseline: 2/12: 10/10 4/1: 6/10  Goal status: Partially Met  3.  Patient will increase 10 meter walk test to >1.69m/s as to improve gait speed for better community ambulation and to reduce fall risk Baseline: 2/12: unable to ambulate 10 meters 4/1: 17.98 seconds with SPC  Goal status: Partially Met   4.  Patient (> 65 years old) will complete five times sit to stand test in < 15 seconds indicating an increased LE strength and improved balance. Baseline: 2/12: unable to perform due to severe pain 4/1: 21 seconds with BUE support Goal status: Partially Met    PLAN:  PT FREQUENCY: 2x/week  PT DURATION: 8 weeks  PLANNED INTERVENTIONS: Therapeutic exercises, Therapeutic activity, Neuromuscular re-education, Balance training, Gait training, Patient/Family education, Self Care, Joint mobilization, Stair training, Vestibular training, Canalith repositioning, Orthotic/Fit training, DME instructions, Electrical stimulation, Spinal manipulation, Spinal mobilization, Cryotherapy, Moist heat, Splintting, Taping, Vasopneumatic device, Traction, Ultrasound, Manual therapy, and Re-evaluation.  PLAN FOR NEXT SESSION:  Reassess tolerance for LE strengthening  Precious Bard PT  12:03 PM, 04/30/23  Physical Therapist - [facility]  Outpatient  Physical Therapy- Main Campus [phone]     Physical Therapist- [facility]   04/30/23, 12:03 PM

## 2023-04-30 ENCOUNTER — Other Ambulatory Visit: Payer: Self-pay | Admitting: Primary Care

## 2023-04-30 ENCOUNTER — Other Ambulatory Visit: Payer: Self-pay | Admitting: Cardiology

## 2023-04-30 ENCOUNTER — Ambulatory Visit: Payer: Medicare Other

## 2023-04-30 DIAGNOSIS — I10 Essential (primary) hypertension: Secondary | ICD-10-CM

## 2023-04-30 DIAGNOSIS — M6281 Muscle weakness (generalized): Secondary | ICD-10-CM

## 2023-04-30 DIAGNOSIS — R262 Difficulty in walking, not elsewhere classified: Secondary | ICD-10-CM | POA: Diagnosis not present

## 2023-04-30 DIAGNOSIS — M5459 Other low back pain: Secondary | ICD-10-CM | POA: Diagnosis not present

## 2023-04-30 LAB — TYPE AND SCREEN
ABO/RH(D): B POS
Antibody Screen: NEGATIVE
Unit division: 0

## 2023-04-30 LAB — BPAM RBC
Blood Product Expiration Date: 202405252359
ISSUE DATE / TIME: 202405151333
Unit Type and Rh: 7300

## 2023-04-30 NOTE — Therapy (Signed)
OUTPATIENT PHYSICAL THERAPY THORACOLUMBAR TREATMENT    Patient Name: Adam Wise MRN: [id number] DOB:Feb 25, 1951, 72 y.o., male Today's Date: 05/04/2023  END OF SESSION:  PT End of Session - 05/04/23 1015     Visit Number 22    Number of Visits 26    Date for PT Re-Evaluation 05/11/23    PT Start Time 1015    PT Stop Time 1059    PT Time Calculation (min) 44 min    Activity Tolerance Patient tolerated treatment well;No increased pain    Behavior During Therapy [facility] for tasks assessed/performed                      Past Medical History:  Diagnosis Date   Chickenpox    Chronic knee pain    Chronic low back pain    COPD exacerbation (HCC) 10/11/2021   COPD with exacerbation (HCC) 10/12/2021   Coronary artery calcification seen on CAT scan 11/2021   Coronary CTA 11/27/2021: Moderate to large right pleural effusion and compressive atelectasis right lung base. => Coronary Calcium Score 657.  Diffuse RCA calcification.  Minimal mild disease in the LAD and diagonal branches. == Overall limited study.  Notable artifact.   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 He has disease progression after the first line of chemotherapy in December 2021.   Testosterone deficiency    Type 2 diabetes mellitus (HCC)    Past Surgical History:  Procedure Laterality Date   CHEST TUBE INSERTION Right 01/20/2022   Procedure: CHEST TUBE INSERTION;  Surgeon: Lorin Glass, MD;  Location: [facility] ENDOSCOPY;  Service: Pulmonary;  Laterality: Right;  w/ Talc Pleurodesis, planned admit for Obs  afterwards   CHEST TUBE INSERTION Right 04/28/2022   Procedure: INSERTION PLEURAL DRAINAGE CATHETER - Pigtail tail, drainage;  Surgeon: Lorin Glass, MD;  Location: [facility] ENDOSCOPY;  Service: Pulmonary;  Laterality: Right;  talc pleurodesis   CHEST TUBE INSERTION Right 09/01/2022   Procedure: INSERTION PLEURAL DRAINAGE CATHETER;  Surgeon: Lupita Leash, MD;  Location: [facility] ENDOSCOPY;  Service: Cardiopulmonary;  Laterality: Right;  pigtail catheter placement with talc pleurodesis   COLONOSCOPY WITH PROPOFOL N/A 12/17/2018   Procedure: COLONOSCOPY WITH PROPOFOL;  Surgeon: Pasty Spillers, MD;  Location: [facility] ENDOSCOPY;  Service: Gastroenterology;  Laterality: N/A;   IR IMAGING GUIDED PORT INSERTION  04/17/2020   IR THORACENTESIS ASP PLEURAL SPACE W/IMG GUIDE  11/29/2021   IR THORACENTESIS ASP PLEURAL SPACE W/IMG GUIDE  01/03/2022   JOINT REPLACEMENT Bilateral    REPLACEMENT TOTAL KNEE BILATERAL  2015   TALC PLEURODESIS  09/01/2022   Procedure: Lurlean Nanny;  Surgeon: Lupita Leash, MD;  Location: [facility] ENDOSCOPY;  Service: Cardiopulmonary;;   TONSILLECTOMY  1960   TRANSTHORACIC ECHOCARDIOGRAM  05/2020   a) 05/2020: EF 55 to 60%.  No R WMA.  Mild LVH.  Indeterminate LVEDP.  Unable to assess RVP.  Normal aortic and mitral valves.  Mildly elevated RAP.; b) 09/2021: EF 50-55%. No RWMA. Mild LVH. ~ LVEDP. Mild LA Dilation. NORMAL RV/RAP.  Normal MV/AoV.   Patient Active Problem List  Diagnosis Date Noted   Hip pain 12/09/2022   Dizziness 11/12/2022   Chronic respiratory failure with hypoxia (HCC) 09/18/2022   Pneumothorax 09/02/2022   Pneumothorax on right 09/01/2022   Recurrent pleural effusion 08/27/2022   Recurrent pleural effusion on right 01/16/2022   Preop cardiovascular exam 12/19/2021   Coronary Calcium Score 657 11/13/2021   Atypical chest pain 11/13/2021   Pleural effusion 10/29/2021   Acute on chronic respiratory failure with hypoxia (HCC) 10/12/2021   Recurrent right  pleural effusion 08/08/2021   Erectile dysfunction 10/26/2020   Port-A-Cath in place 06/11/2020   History of pulmonary embolism 05/15/2020   Small cell lung cancer, right upper lobe (HCC) 04/12/2020   Chronic fatigue 04/12/2020   Encounter for antineoplastic chemotherapy 04/12/2020   Encounter for antineoplastic immunotherapy 04/12/2020   Malignant neoplasm of upper lobe of right lung (HCC) 04/04/2020   Goals of care, counseling/discussion 03/30/2020   Exertional dyspnea 03/26/2020   Hyperlipidemia associated with type 2 diabetes mellitus (HCC) 11/08/2019   Joint swelling 11/16/2018   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   Essential hypertension 09/09/2017   Type 2 diabetes mellitus (HCC) 09/09/2017   GERD (gastroesophageal reflux disease) 09/09/2017   Testosterone deficiency 09/09/2017   OSA (obstructive sleep apnea) 09/09/2017   Chronic bilateral low back pain without sciatica 09/09/2017   H/O knee surgery 04/27/2014   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    PCP: Doreene Nest NP   REFERRING PROVIDER: Doreene Nest NP   REFERRING DIAG: chronic bilateral low back pain without sciatica   Rationale for Evaluation and Treatment: Rehabilitation  THERAPY DIAG:  Other low back pain  Muscle weakness (generalized)  Difficulty in walking, not elsewhere classified  ONSET DATE: December 2023  SUBJECTIVE:                                                                                                                                                                                           SUBJECTIVE STATEMENT: Patient presents with severe pain radiating from back around R hip and groin.   PERTINENT HISTORY:  Patient is a very pleasant 72 year old male presenting to physical therapy for chronic low back pain without sciatica. Patient has been seen by this therapist in the past. Patient additionally has relapsed extensive stage small cell lung cancer and now multiple  metastatic liver lesions diagnosed in April 2021 with history of radiotherapy and chemotherapy. PMH includes chickenpox COPD, CAD, HTN, GERD, OSA, type II DM, bilateral TKA. Patient tried figure four and stomach pressing up. Also having vertigo.   PAIN:  Are you having pain? 4/10 pain  PRECAUTIONS: Other: back, cancer  ,  falls   WEIGHT BEARING RESTRICTIONS: No  FALLS:  Has patient fallen in last 6 months? No  OCCUPATION: retired Theatre stage manager  PLOF: Independent  PATIENT GOALS: to have less pain  NEXT MD VISIT: n/a  OBJECTIVE:     TODAY'S TREATMENT:                                                                                                                              DATE: 05/04/23  There.ex:      Seated hamstring stretch 2x45 seconds each LE Rolling to the L and maintaining position  Sit to stand with CGA    Manual: Hamstring stretch 60 seconds each LE  Single knee to chest 60 seconds each LE Piriformis stretch 60 seconds each LE Roller to R IT band x  6 minutes SAD lateral with belt RLE 4x30 seconds  STM in sidelying to R lumbar paraspinals and quadratus lumborum x 13 minutes   PATIENT EDUCATION:  Assisted with modifications for stretches today to have more options for sitting activities and fewer needs to be in supine.    HOME EXERCISE PROGRAM: Anterior pelvic tilt 10x Hamstring stretch 30 seconds Lay prone 60 seconds   Access Code: [email or web address] URL: [email or web address] Date: 01/29/2023 Prepared by: Precious Bard  Exercises - Seated Anterior Pelvic Tilt  - 1 x daily - 7 x weekly - 2 sets - 10 reps - 5 hold - Supine Anterior Pelvic Tilt  - 1 x daily - 7 x weekly - 2 sets - 10 reps - 5 hold - Seated Hamstring Stretch  - 1 x daily - 7 x weekly - 2 sets - 2 reps - 30 hold - Supine Transversus Abdominis Bracing - Hands on Ground  - 1 x daily - 7 x weekly - 2 sets - 10 reps - 5 hold  ASSESSMENT:  CLINICAL IMPRESSION: Patient reports decreased  pain by end of session and tolerates seated and standing position. He is highly  motivated throughout session for pain reduction but initially requires assistance with movement.   Patient has significant tension of quadratus that is released with manual. Patient encouraged to follow up with ortho, patient and wife agreeable. Pt will benefit from skilled PT services to address deficits in order to improve overall QoL and return to PLOF.     OBJECTIVE IMPAIRMENTS: Abnormal gait, cardiopulmonary status limiting activity, decreased activity tolerance, decreased balance, decreased endurance, decreased mobility, difficulty walking, decreased ROM, decreased strength, hypomobility, impaired perceived functional ability, increased muscle spasms, impaired flexibility, impaired sensation, improper body mechanics, postural dysfunction, obesity, and pain.   ACTIVITY LIMITATIONS: carrying, lifting, bending, sitting, standing, squatting, sleeping, stairs, transfers, bed mobility, bathing, toileting, dressing, reach over head, hygiene/grooming, and locomotion level  PARTICIPATION LIMITATIONS: meal prep, cleaning, laundry, medication management, personal finances, interpersonal relationship, driving, shopping, community activity, occupation, yard work, and church  PERSONAL FACTORS: Age, Behavior pattern, Education, Fitness, Past/current experiences, Profession, Sex, Social background, Time since onset of  injury/illness/exacerbation, and 3+ comorbidities: chickenpox COPD, CAD, HTN, GERD, OSA, type II DM, bilateral TKA  are also affecting patient's functional outcome.   REHAB POTENTIAL: Good  CLINICAL DECISION MAKING: Evolving/moderate complexity  EVALUATION COMPLEXITY: Moderate   GOALS: Goals reviewed with patient? Yes  SHORT TERM GOALS: Target date: 04/13/2023    Patient will be independent in home exercise program to improve strength/mobility for better functional independence with ADLs. Baseline: 2/12:  given to patient 4/1: compliant Goal status: MET  2.  .  Patient will report a worst pain of  5/10 on VAS in low back to improve tolerance with ADLs and reduced symptoms with activities. Baseline: 2/12: 10/10 4/1: 6/10  Goal status: Partially Met    LONG TERM GOALS: Target date: 05/11/2023   Patient will increase FOTO score to equal to or greater than 48%    to demonstrate statistically significant improvement in mobility and quality of life.  Baseline: 2/12: 36% 4/1: 37% Goal status: Partially Met  2.  Patient will report a worst pain of 3/10 on VAS in low back to improve tolerance with ADLs and reduced symptoms with activities.  Baseline: 2/12: 10/10 4/1: 6/10  Goal status: Partially Met  3.  Patient will increase 10 meter walk test to >1.69m/s as to improve gait speed for better community ambulation and to reduce fall risk Baseline: 2/12: unable to ambulate 10 meters 4/1: 17.98 seconds with SPC  Goal status: Partially Met   4.  Patient (> 1 years old) will complete five times sit to stand test in < 15 seconds indicating an increased LE strength and improved balance. Baseline: 2/12: unable to perform due to severe pain 4/1: 21 seconds with BUE support Goal status: Partially Met    PLAN:  PT FREQUENCY: 2x/week  PT DURATION: 8 weeks  PLANNED INTERVENTIONS: Therapeutic exercises, Therapeutic activity, Neuromuscular re-education, Balance training, Gait training, Patient/Family education, Self Care, Joint mobilization, Stair training, Vestibular training, Canalith repositioning, Orthotic/Fit training, DME instructions, Electrical stimulation, Spinal manipulation, Spinal mobilization, Cryotherapy, Moist heat, Splintting, Taping, Vasopneumatic device, Traction, Ultrasound, Manual therapy, and Re-evaluation.  PLAN FOR NEXT SESSION:  Reassess tolerance for LE strengthening  Precious Bard PT  1:39 PM, 05/04/23  Physical Therapist - [facility]   Outpatient Physical Therapy- Main Campus [phone]     Physical Therapist- [facility]   05/04/23, 1:39 PM

## 2023-05-01 ENCOUNTER — Ambulatory Visit: Payer: Medicare Other

## 2023-05-01 ENCOUNTER — Ambulatory Visit (HOSPITAL_COMMUNITY)
Admission: RE | Admit: 2023-05-01 | Discharge: 2023-05-01 | Disposition: A | Discharge status: Home / Self Care | Payer: Medicare Other | Source: Ambulatory Visit | Attending: Physician Assistant | Admitting: Physician Assistant

## 2023-05-01 DIAGNOSIS — C3411 Malignant neoplasm of upper lobe, right bronchus or lung: Secondary | ICD-10-CM

## 2023-05-01 DIAGNOSIS — C3490 Malignant neoplasm of unspecified part of unspecified bronchus or lung: Secondary | ICD-10-CM | POA: Diagnosis not present

## 2023-05-01 MED ORDER — IOHEXOL 300 MG/ML  SOLN
100.0000 mL | Freq: Once | INTRAMUSCULAR | Status: AC | PRN
  Administered 2023-05-01: 100 mL via INTRAVENOUS

## 2023-05-01 MED ORDER — HEPARIN SOD (PORK) LOCK FLUSH 100 UNIT/ML IV SOLN
500.0000 [IU] | Freq: Once | INTRAVENOUS | Status: AC
  Administered 2023-05-01: 500 [IU] via INTRAVENOUS

## 2023-05-02 ENCOUNTER — Other Ambulatory Visit: Payer: Self-pay | Admitting: Physician Assistant

## 2023-05-02 DIAGNOSIS — I2692 Saddle embolus of pulmonary artery without acute cor pulmonale: Secondary | ICD-10-CM

## 2023-05-04 ENCOUNTER — Ambulatory Visit: Payer: Medicare Other

## 2023-05-04 DIAGNOSIS — M6281 Muscle weakness (generalized): Secondary | ICD-10-CM | POA: Diagnosis not present

## 2023-05-04 DIAGNOSIS — R262 Difficulty in walking, not elsewhere classified: Secondary | ICD-10-CM | POA: Diagnosis not present

## 2023-05-04 DIAGNOSIS — M5459 Other low back pain: Secondary | ICD-10-CM | POA: Diagnosis not present

## 2023-05-04 MED FILL — Dexamethasone Sodium Phosphate Inj 100 MG/10ML: INTRAMUSCULAR | Qty: 1 | Status: AC

## 2023-05-04 MED FILL — Fosaprepitant Dimeglumine For IV Infusion 150 MG (Base Eq): INTRAVENOUS | Qty: 5 | Status: AC

## 2023-05-05 ENCOUNTER — Ambulatory Visit (HOSPITAL_COMMUNITY): Payer: Medicare Other

## 2023-05-05 ENCOUNTER — Inpatient Hospital Stay: Payer: Medicare Other

## 2023-05-05 ENCOUNTER — Inpatient Hospital Stay (HOSPITAL_BASED_OUTPATIENT_CLINIC_OR_DEPARTMENT_OTHER): Payer: Medicare Other | Admitting: Internal Medicine

## 2023-05-05 ENCOUNTER — Telehealth: Payer: Self-pay | Admitting: Medical Oncology

## 2023-05-05 DIAGNOSIS — Z5189 Encounter for other specified aftercare: Secondary | ICD-10-CM | POA: Diagnosis not present

## 2023-05-05 DIAGNOSIS — Z95828 Presence of other vascular implants and grafts: Secondary | ICD-10-CM

## 2023-05-05 DIAGNOSIS — D61810 Antineoplastic chemotherapy induced pancytopenia: Secondary | ICD-10-CM | POA: Diagnosis not present

## 2023-05-05 DIAGNOSIS — Z452 Encounter for adjustment and management of vascular access device: Secondary | ICD-10-CM | POA: Diagnosis not present

## 2023-05-05 DIAGNOSIS — C3411 Malignant neoplasm of upper lobe, right bronchus or lung: Secondary | ICD-10-CM

## 2023-05-05 DIAGNOSIS — D6481 Anemia due to antineoplastic chemotherapy: Secondary | ICD-10-CM

## 2023-05-05 DIAGNOSIS — C787 Secondary malignant neoplasm of liver and intrahepatic bile duct: Secondary | ICD-10-CM | POA: Diagnosis not present

## 2023-05-05 DIAGNOSIS — Z5111 Encounter for antineoplastic chemotherapy: Secondary | ICD-10-CM | POA: Diagnosis not present

## 2023-05-05 LAB — CMP (CANCER CENTER ONLY)
ALT: 17 U/L (ref 0–44)
AST: 21 U/L (ref 15–41)
Albumin: 3.5 g/dL (ref 3.5–5.0)
Alkaline Phosphatase: 89 U/L (ref 38–126)
Anion gap: 9 (ref 5–15)
BUN: 10 mg/dL (ref 8–23)
CO2: 28 mmol/L (ref 22–32)
Calcium: 8.2 mg/dL — ABNORMAL LOW (ref 8.9–10.3)
Chloride: 101 mmol/L (ref 98–111)
Creatinine: 1.09 mg/dL (ref 0.61–1.24)
GFR, Estimated: >60 mL/min (ref >60)
Glucose, Bld: 260 mg/dL — ABNORMAL HIGH (ref 70–99)
Potassium: 4.1 mmol/L (ref 3.5–5.1)
Sodium: 138 mmol/L (ref 135–145)
Total Bilirubin: 0.5 mg/dL (ref 0.3–1.2)
Total Protein: 6.2 g/dL — ABNORMAL LOW (ref 6.5–8.1)

## 2023-05-05 LAB — CBC WITH DIFFERENTIAL (CANCER CENTER ONLY)
Abs Immature Granulocytes: 0.1 10*3/uL — ABNORMAL HIGH (ref 0.00–0.07)
Basophils Absolute: 0 10*3/uL (ref 0.0–0.1)
Basophils Relative: 0 %
Eosinophils Absolute: 0.1 10*3/uL (ref 0.0–0.5)
Eosinophils Relative: 1 %
HCT: 26.5 % — ABNORMAL LOW (ref 39.0–52.0)
Hemoglobin: 8.3 g/dL — ABNORMAL LOW (ref 13.0–17.0)
Immature Granulocytes: 1 %
Lymphocytes Relative: 7 %
Lymphs Abs: 0.5 10*3/uL — ABNORMAL LOW (ref 0.7–4.0)
MCH: 31.7 pg (ref 26.0–34.0)
MCHC: 31.3 g/dL (ref 30.0–36.0)
MCV: 101.1 fL — ABNORMAL HIGH (ref 80.0–100.0)
Monocytes Absolute: 0.8 10*3/uL (ref 0.1–1.0)
Monocytes Relative: 10 %
Neutro Abs: 5.9 10*3/uL (ref 1.7–7.7)
Neutrophils Relative %: 81 %
Platelet Count: 116 10*3/uL — ABNORMAL LOW (ref 150–400)
RBC: 2.62 MIL/uL — ABNORMAL LOW (ref 4.22–5.81)
RDW: 17.9 % — ABNORMAL HIGH (ref 11.5–15.5)
WBC Count: 7.3 10*3/uL (ref 4.0–10.5)
nRBC: 0 % (ref 0.0–0.2)

## 2023-05-05 LAB — SAMPLE TO BLOOD BANK

## 2023-05-05 MED ORDER — SODIUM CHLORIDE 0.9% FLUSH
10.0000 mL | Freq: Once | INTRAVENOUS | Status: AC
  Administered 2023-05-05: 10 mL

## 2023-05-05 MED ORDER — METHYLPREDNISOLONE 4 MG PO TBPK: ORAL_TABLET | ORAL | 0 refills | Status: DC

## 2023-05-05 MED ORDER — HEPARIN SOD (PORK) LOCK FLUSH 100 UNIT/ML IV SOLN
500.0000 [IU] | Freq: Once | INTRAVENOUS | Status: AC
  Administered 2023-05-05: 500 [IU]

## 2023-05-05 MED FILL — Dexamethasone Sodium Phosphate Inj 100 MG/10ML: INTRAMUSCULAR | Qty: 1 | Status: AC

## 2023-05-05 NOTE — Progress Notes (Signed)
[facility] [phone]   [phone]  OFFICE PROGRESS NOTE  Doreene Nest, NP [address] [place] [address]  DIAGNOSIS: Relapsed extensive stage (T4, N3, M1c)  small cell lung cancer diagnosed in April 2021 and presented with large right upper lobe/right hilar mass with ipsilateral and contralateral mediastinal and right supraclavicular lymphadenopathy in addition to multiple liver lesions. He has disease progression after the first line of chemotherapy in December 2021.  PRIOR THERAPY: 1) Palliative radiotherapy to the right upper lobe lung mass under the care of Dr. Mitzi Hansen. 2) Systemic chemotherapy with carboplatin for AUC of 5 on day 1, etoposide 100 mg/M2 on days 1, 2 and 3 in addition to Imfinzi 1500 mg IV every 3 weeks with chemotherapy then every 4 weeks for maintenance if the patient has no evidence for progression.  He will also receive Cosela 240 mg/m2 on the days of the chemotherapy.  Status post 9 cycles.  Starting from cycle #5 the patient will be on maintenance treatment with immunotherapy with Imfinzi 1500 mg IV every 4 weeks. Last dose of chemotherapy was given on November 13, 2020. This treatment was discontinued secondary to disease progression 3) Systemic chemotherapy with carboplatin for AUC of 5 on day 1, etoposide 100 mg/M2 on days 1, 2 and 3, Tecentriq 1200 mg IV every 3 weeks as well as Cosela 250 mg/M2 on the days of the chemotherapy every 3 weeks.  First dose December 18, 2020.  Status post 8 cycles. 4) Zepzelca (lurbinectedin) 3.2 mgm2 IV every 3 weeks. Last dose on 01/23/22. Status post 12 cycles.  5) Palliative systemic chemotherapy with irinotecan 65 mg/m2 on days 1 and 8 IV every 3 weeks.  Status post 3 cycles.  Last dose was given April 01, 2022 discontinued secondary to disease progression. 6) SBRT to the progressive liver lesions under the care of Dr. Mitzi Hansen.  Last fraction January 27, 2023. 7) systemic chemotherapy  with carboplatin for AUC of 5 on day 1, etoposide 100 Mg/M2 on days 1, 2 and 3 with Cosela before the chemotherapy.  First dose expected to start on 05/28/2022.  Status post 15 cycles.  Starting from cycle #5 his carboplatin will be reduced to AUC of 4 and 2 etoposide 80 Mg/M2.  Last dose was giving April 07, 2023 discontinued for suspicious disease progression.   CURRENT THERAPY: Consideration of treatment with Tarlatamab at [facility].  INTERVAL HISTORY: Adam Wise 72 y.o. male returns to the clinic today for follow-up visit accompanied by his wife.  The patient is feeling fine today with no concerning complaints except for the persistent dry cough.  He also has shortness of breath with exertion but no significant chest pain or hemoptysis.  He has no nausea, vomiting, diarrhea or constipation.  He has no headache or visual changes.  He has no recent weight loss or night sweats.  He is here today for evaluation with repeat CT scan of the chest, abdomen and pelvis for restaging of his disease.   MEDICAL HISTORY: Past Medical History:  Diagnosis Date   Chickenpox    Chronic knee pain    Chronic low back pain    COPD exacerbation (HCC) 10/11/2021   COPD with exacerbation (HCC) 10/12/2021   Coronary artery calcification seen on CAT scan 11/2021   Coronary CTA 11/27/2021: Moderate to large right pleural effusion and compressive atelectasis right lung base. => Coronary Calcium Score 657.  Diffuse RCA calcification.  Minimal mild disease in the LAD and diagonal branches. == Overall limited study.  Notable artifact.   Essential hypertension    GERD (gastroesophageal reflux disease)    Hyperlipidemia    Malignant neoplasm of upper lobe of right lung (HCC) 04/04/2020   OSA (obstructive sleep apnea)    With nighttime oxygen supplementation   T4, M3, M1 C Metastatic Small Cell Lung Cancer 03/2020   large right upper lobe/right hilar mass with ipsilateral and contralateral mediastinal and right  supraclavicular lymphadenopathy in addition to multiple liver lesios. He has disease progression after the first line of chemotherapy in December 2021.   Testosterone deficiency    Type 2 diabetes mellitus (HCC)     ALLERGIES:  is allergic to bupropion and hydrochlorothiazide.  MEDICATIONS:  Current Outpatient Medications  Medication Sig Dispense Refill   albuterol (VENTOLIN HFA) 108 (90 Base) MCG/ACT inhaler Inhale 2 puffs into the lungs every 6 (six) hours as needed for wheezing or shortness of breath. 18 g 0   amLODipine (NORVASC) 10 MG tablet TAKE 1 TABLET BY MOUTH EVERY DAY FOR BLOOD PRESSURE 90 tablet 1   B-D 3CC LUER-LOK SYR 22GX1" 22G X 1" 3 ML MISC USE AS INSTRUCTED FOR TESTOSTERONE INJECTION EVERY 2 WEEKS 10 each 2   bisoprolol (ZEBETA) 5 MG tablet TAKE 1 TABLET (5 MG TOTAL) BY MOUTH DAILY. 90 tablet 0   Black Pepper-Turmeric (TURMERIC COMPLEX/BLACK PEPPER PO) Take 1 tablet by mouth in the morning and at bedtime.     blood glucose meter kit and supplies KIT Dispense based on patient and insurance preference. Use up to four times daily as directed. 1 each 0   chlorpheniramine-HYDROcodone (TUSSIONEX) 10-8 MG/5ML Take 5 mLs by mouth every 12 (twelve) hours as needed for cough. 473 mL 0   Coenzyme Q10 (COQ10) 200 MG CAPS Take 200 mg by mouth at bedtime.     cyclobenzaprine (FLEXERIL) 5 MG tablet Take 1 tablet (5 mg total) by mouth at bedtime as needed for muscle spasms. 90 tablet 0   ELIQUIS 5 MG TABS tablet TAKE 1 TABLET BY MOUTH TWICE A DAY 180 tablet 2   esomeprazole (NEXIUM) 20 MG capsule Take 20 mg by mouth in the morning.     gabapentin (NEURONTIN) 300 MG capsule TAKE 1 CAPSULE (300 MG TOTAL) BY MOUTH 2 (TWO) TIMES DAILY. FOR BACK PAIN. 180 capsule 2   glipiZIDE (GLUCOTROL) 10 MG tablet TAKE 1 TABLET (10 MG TOTAL) BY MOUTH 2 (TWO) TIMES DAILY BEFORE A MEAL. FOR DIABETES. 180 tablet 1   glucose blood (ONETOUCH ULTRA) test strip USE UP TO 4 TIMES DAILY AS DIRECTED 400 strip 5    Insulin Glargine (BASAGLAR KWIKPEN) 100 UNIT/ML INJECT 24 UNITS INTO THE SKIN AT BEDTIME. FOR DIABETES. 30 mL 1   Insulin Pen Needle (BD PEN NEEDLE NANO U/F) 32G X 4 MM MISC Use with insulin as prescribed Dx Code: E11.9 200 each 3   ipratropium-albuterol (DUONEB) 0.5-2.5 (3) MG/3ML SOLN Take 3 mLs by nebulization every 4 (four) hours while awake for 3 days, THEN 3 mLs every 4 (four) hours as needed (shortness of breath or wheezing). 360 mL 0   KRILL OIL PO Take 1 capsule by mouth daily.     Lancets MISC USE UP TO 3 TIMES DAILY AS DIRECTED 100 each 2   losartan (COZAAR) 50 MG tablet TAKE 1 TABLET (50 MG TOTAL) BY MOUTH DAILY. FOR BLOOD PRESSURE. 90 tablet 2   Melatonin 10 MG CAPS Take 10 mg  by mouth at bedtime.     metFORMIN (GLUCOPHAGE) 1000 MG tablet TAKE 1 TABLET (1,000 MG TOTAL) BY MOUTH 2 (TWO) TIMES DAILY WITH A MEAL. FOR DIABETES 180 tablet 1   Multiple Vitamin (MULTI-VITAMINS) TABS Take 1 tablet by mouth in the morning.     oxymetazoline (AFRIN) 0.05 % nasal spray Place 1 spray into both nostrils daily as needed for congestion.     pioglitazone (ACTOS) 45 MG tablet TAKE 1 TABLET (45 MG TOTAL) BY MOUTH DAILY. FOR DIABETES. 90 tablet 1   pravastatin (PRAVACHOL) 40 MG tablet TAKE 1 TABLET BY MOUTH EVERY DAY IN THE EVENING FOR CHOLESTEROL 90 tablet 2   testosterone cypionate (DEPOTESTOSTERONE CYPIONATE) 200 MG/ML injection Inject 200 mg into the muscle every 14 (fourteen) days.     traMADol (ULTRAM) 50 MG tablet Take 1 tablet (50 mg total) by mouth at bedtime as needed for severe pain. 30 tablet 0   No current facility-administered medications for this visit.    SURGICAL HISTORY:  Past Surgical History:  Procedure Laterality Date   CHEST TUBE INSERTION Right 01/20/2022   Procedure: CHEST TUBE INSERTION;  Surgeon: Lorin Glass, MD;  Location: [facility] ENDOSCOPY;  Service: Pulmonary;  Laterality: Right;  w/ Talc Pleurodesis, planned admit for Obs afterwards   CHEST TUBE INSERTION Right 04/28/2022    Procedure: INSERTION PLEURAL DRAINAGE CATHETER - Pigtail tail, drainage;  Surgeon: Lorin Glass, MD;  Location: [facility] ENDOSCOPY;  Service: Pulmonary;  Laterality: Right;  talc pleurodesis   CHEST TUBE INSERTION Right 09/01/2022   Procedure: INSERTION PLEURAL DRAINAGE CATHETER;  Surgeon: Lupita Leash, MD;  Location: [facility] ENDOSCOPY;  Service: Cardiopulmonary;  Laterality: Right;  pigtail catheter placement with talc pleurodesis   COLONOSCOPY WITH PROPOFOL N/A 12/17/2018   Procedure: COLONOSCOPY WITH PROPOFOL;  Surgeon: Pasty Spillers, MD;  Location: [facility] ENDOSCOPY;  Service: Gastroenterology;  Laterality: N/A;   IR IMAGING GUIDED PORT INSERTION  04/17/2020   IR THORACENTESIS ASP PLEURAL SPACE W/IMG GUIDE  11/29/2021   IR THORACENTESIS ASP PLEURAL SPACE W/IMG GUIDE  01/03/2022   JOINT REPLACEMENT Bilateral    REPLACEMENT TOTAL KNEE BILATERAL  2015   TALC PLEURODESIS  09/01/2022   Procedure: Lurlean Nanny;  Surgeon: Lupita Leash, MD;  Location: [facility] ENDOSCOPY;  Service: Cardiopulmonary;;   TONSILLECTOMY  1960   TRANSTHORACIC ECHOCARDIOGRAM  05/2020   a) 05/2020: EF 55 to 60%.  No R WMA.  Mild LVH.  Indeterminate LVEDP.  Unable to assess RVP.  Normal aortic and mitral valves.  Mildly elevated RAP.; b) 09/2021: EF 50-55%. No RWMA. Mild LVH. ~ LVEDP. Mild LA Dilation. NORMAL RV/RAP.  Normal MV/AoV.    REVIEW OF SYSTEMS:  Constitutional: positive for fatigue Eyes: negative Ears, nose, mouth, throat, and face: negative Respiratory: positive for cough and dyspnea on exertion Cardiovascular: negative Gastrointestinal: negative Genitourinary:negative Integument/breast: negative Hematologic/lymphatic: negative Musculoskeletal:negative Neurological: negative Behavioral/Psych: negative Endocrine: negative Allergic/Immunologic: negative   PHYSICAL EXAMINATION: General appearance: alert, cooperative, fatigued, and no distress Head: Normocephalic, without obvious abnormality,  atraumatic Neck: no adenopathy, no JVD, supple, symmetrical, trachea midline, and thyroid not enlarged, symmetric, no tenderness/mass/nodules Lymph nodes: Cervical, supraclavicular, and axillary nodes normal. Resp: rales RLL Back: symmetric, no curvature. ROM normal. No CVA tenderness. Cardio: regular rate and rhythm, S1, S2 normal, no murmur, click, rub or gallop GI: soft, non-tender; bowel sounds normal; no masses,  no organomegaly Extremities: extremities normal, atraumatic, no cyanosis or edema Neurologic: Alert and oriented X 3, normal strength and tone. Normal symmetric  reflexes. Normal coordination and gait  ECOG PERFORMANCE STATUS: 1 - Symptomatic but completely ambulatory  Blood pressure 101/66, pulse 98, temperature 97.9 F (36.6 C), temperature source Temporal, resp. rate 18, weight 290 lb 8 oz (131.8 kg), SpO2 95 %.   LABORATORY DATA: Lab Results  Component Value Date   WBC 7.3 05/05/2023   HGB 8.3 (L) 05/05/2023   HCT 26.5 (L) 05/05/2023   MCV 101.1 (H) 05/05/2023   PLT 116 (L) 05/05/2023      Chemistry      Component Value Date/Time   NA 139 04/28/2023 1054   K 4.0 04/28/2023 1054   CL 103 04/28/2023 1054   CO2 27 04/28/2023 1054   BUN 14 04/28/2023 1054   CREATININE 1.04 04/28/2023 1054   CREATININE 1.45 (H) 10/18/2021 1533      Component Value Date/Time   CALCIUM 7.9 (L) 04/28/2023 1054   ALKPHOS 102 04/28/2023 1054   AST 18 04/28/2023 1054   ALT 17 04/28/2023 1054   BILITOT 0.5 04/28/2023 1054       RADIOGRAPHIC STUDIES:    ASSESSMENT AND PLAN: This is a very pleasant 72 year old Caucasian male diagnosed with extensive stage (T4, N3, M1C) small cell lung cancer presented with large right upper lobe lung mass in addition to mediastinal and right supraclavicular lymphadenopathy and multiple metastatic liver lesions diagnosed in April 2021. The patient initially underwent systemic chemotherapy with carboplatin for an AUC of 5 on day 1, etoposide 100 mg  per metered squared on days 1, 2, and 3 in addition to [facility] for myeloprotection.  He also received immunotherapy with Imfinzi on day 1 of every chemotherapy cycle.  He is status post 9 cycles.  Starting from cycle #5 he was on single agent immunotherapy with Imfinzi IV every 4 weeks.  The patient had evidence of disease progression on his scan from December 2021.  He was then started on systemic chemotherapy with carboplatin for an AUC of 5 on day 1, etoposide 100 mg per metered squared on days 1, 2, and 3 in addition to Tecentriq 1200 mg on day 1, the patient is status post 9 cycles.  Starting from cycle #5, the patient started maintenance immunotherapy with Tecentriq.  This was discontinued due to evidence of disease progression. The patient started third line treatment with Zepzelca (lurbinectedin) 3.2 Mg/M2 every 3-week status post 12 cycles of this treatment and he has been tolerating it fairly well except for fatigue.  This treatment was discontinued secondary to disease progression. He underwent fourth line treatment with systemic chemotherapy with irinotecan 65 Mg/M2 on days 1 and 8 every 3 weeks.  Status post 3 cycles.  He tolerated this treatment well with no concerning adverse effect except for mild fatigue. Unfortunately his scan showed evidence for disease progression especially in the liver. I recommended for the patient to discontinue his current treatment with irinotecan at this point. He started systemic chemotherapy again with carboplatin for AUC of 5 on day 1, etoposide 100 Mg/M2 on days 1, 2 and 3 with Cosela status post 15 cycles. I did reduce the dose of carboplatin to AUC of 4 and etoposide 80 Mg/M2 starting from cycle #5.  He has been tolerating this treatment fairly well with no concerning complaints except for the increasing fatigue and weakness as well as the chemotherapy-induced pancytopenia. He had repeat CT scan of the chest, abdomen and pelvis performed recently.  I  personally and independently reviewed the scan images and discussed the result  and showed the images to the patient and his wife.  His scan showed redemonstration of the posttreatment/postradiation appearance of the right chest with dense fibrosis and consolidation of the perihilar right lung as well as a small loculated appearing right pleural effusion with associated pleural densities suggesting prior talc pleurodesis.  There was significantly increased areas of masslike consolidation in the right lung for example in the peripheral right upper lobe and superior segment of the right lower lobe with extensive new associated nodularity concerning for local malignant recurrence. The patient is also more symptomatic with more dry cough and shortness of breath. I recommended for him to hold any additional treatment with chemotherapy for now. I will refer the patient back to Dr. Janann August at [facility] for consideration of treatment with the newly approved Tarlatamab which is a by specific T-cell engager (BiTE) and it could be associated with cytokine release syndrome and better managed at a tertiary center especially at the beginning. The patient is in agreement with the current plan.  I will see him for evaluation based on his need after his visit with Dr. Janann August. For the dry cough and shortness of breath, I will give the patient prescription for Medrol Dosepak. He was advised to call immediately if he has any other concerning symptoms in the interval. The patient voices understanding of current disease status and treatment options and is in agreement with the current care plan.  All questions were answered. The patient knows to call the clinic with any problems, questions or concerns. We can certainly see the patient much sooner if necessary.  The total time spent in the appointment was 35 minutes.  Disclaimer: This note was dictated with voice recognition software. Similar sounding words can  inadvertently be transcribed and may not be corrected upon review. Lajuana Matte, MD 05/05/23

## 2023-05-05 NOTE — Telephone Encounter (Signed)
Referral and records faxed to thoracic department

## 2023-05-06 ENCOUNTER — Encounter: Payer: Self-pay | Admitting: Internal Medicine

## 2023-05-06 ENCOUNTER — Telehealth: Payer: Self-pay | Admitting: Medical Oncology

## 2023-05-06 ENCOUNTER — Inpatient Hospital Stay: Payer: Medicare Other

## 2023-05-06 NOTE — Therapy (Signed)
OUTPATIENT PHYSICAL THERAPY THORACOLUMBAR TREATMENT/RECERT    Patient Name: Adam Wise MRN: [id number] DOB:[birth year], 72 y.o., male Today's Date: 05/07/2023  END OF SESSION:  PT End of Session - 05/07/23 1226     Visit Number 23    Number of Visits 39    Date for PT Re-Evaluation 07/02/23    PT Start Time 1100    PT Stop Time 1144    PT Time Calculation (min) 44 min    Activity Tolerance Patient tolerated treatment well;No increased pain    Behavior During Therapy [facility] for tasks assessed/performed                       Past Medical History:  Diagnosis Date   Chickenpox    Chronic knee pain    Chronic low back pain    COPD exacerbation (HCC) 10/11/2021   COPD with exacerbation (HCC) 10/12/2021   Coronary artery calcification seen on CAT scan 11/2021   Coronary CTA 11/27/2021: Moderate to large right pleural effusion and compressive atelectasis right lung base. => Coronary Calcium Score 657.  Diffuse RCA calcification.  Minimal mild disease in the LAD and diagonal branches. == Overall limited study.  Notable artifact.   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 He has disease progression after the first line of chemotherapy in December 2021.   Testosterone deficiency    Type 2 diabetes mellitus (HCC)    Past Surgical History:  Procedure Laterality Date   CHEST TUBE INSERTION Right 01/20/2022   Procedure: CHEST TUBE INSERTION;  Surgeon: Lorin Glass, MD;  Location: [facility] ENDOSCOPY;  Service: Pulmonary;  Laterality: Right;  w/ Talc Pleurodesis, planned admit for  Obs afterwards   CHEST TUBE INSERTION Right 04/28/2022   Procedure: INSERTION PLEURAL DRAINAGE CATHETER - Pigtail tail, drainage;  Surgeon: Lorin Glass, MD;  Location: [facility] ENDOSCOPY;  Service: Pulmonary;  Laterality: Right;  talc pleurodesis   CHEST TUBE INSERTION Right 09/01/2022   Procedure: INSERTION PLEURAL DRAINAGE CATHETER;  Surgeon: Lupita Leash, MD;  Location: [facility] ENDOSCOPY;  Service: Cardiopulmonary;  Laterality: Right;  pigtail catheter placement with talc pleurodesis   COLONOSCOPY WITH PROPOFOL N/A 12/17/2018   Procedure: COLONOSCOPY WITH PROPOFOL;  Surgeon: Pasty Spillers, MD;  Location: [facility] ENDOSCOPY;  Service: Gastroenterology;  Laterality: N/A;   IR IMAGING GUIDED PORT INSERTION  04/17/2020   IR THORACENTESIS ASP PLEURAL SPACE W/IMG GUIDE  11/29/2021   IR THORACENTESIS ASP PLEURAL SPACE W/IMG GUIDE  01/03/2022   JOINT REPLACEMENT Bilateral    REPLACEMENT TOTAL KNEE BILATERAL  2015   TALC PLEURODESIS  09/01/2022   Procedure: Lurlean Nanny;  Surgeon: Lupita Leash, MD;  Location: [facility] ENDOSCOPY;  Service: Cardiopulmonary;;   TONSILLECTOMY  1960   TRANSTHORACIC ECHOCARDIOGRAM  05/2020   a) 05/2020: EF 55 to 60%.  No R WMA.  Mild LVH.  Indeterminate LVEDP.  Unable to assess RVP.  Normal aortic and mitral valves.  Mildly elevated RAP.; b) 09/2021: EF 50-55%. No RWMA. Mild LVH. ~ LVEDP. Mild LA Dilation. NORMAL RV/RAP.  Normal MV/AoV.   Patient Active Problem List  Diagnosis Date Noted   Hip pain 12/09/2022   Dizziness 11/12/2022   Chronic respiratory failure with hypoxia (HCC) 09/18/2022   Pneumothorax 09/02/2022   Pneumothorax on right 09/01/2022   Recurrent pleural effusion 08/27/2022   Recurrent pleural effusion on right 01/16/2022   Preop cardiovascular exam 12/19/2021   Coronary Calcium Score 657 11/13/2021   Atypical chest pain 11/13/2021   Pleural effusion 10/29/2021   Acute on chronic respiratory failure with hypoxia (HCC) 10/12/2021   Recurrent  right pleural effusion 08/08/2021   Erectile dysfunction 10/26/2020   Port-A-Cath in place 06/11/2020   History of pulmonary embolism 05/15/2020   Small cell lung cancer, right upper lobe (HCC) 04/12/2020   Chronic fatigue 04/12/2020   Encounter for antineoplastic chemotherapy 04/12/2020   Encounter for antineoplastic immunotherapy 04/12/2020   Malignant neoplasm of upper lobe of right lung (HCC) 04/04/2020   Goals of care, counseling/discussion 03/30/2020   Exertional dyspnea 03/26/2020   Hyperlipidemia associated with type 2 diabetes mellitus (HCC) 11/08/2019   Joint swelling 11/16/2018   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   Essential hypertension 09/09/2017   Type 2 diabetes mellitus (HCC) 09/09/2017   GERD (gastroesophageal reflux disease) 09/09/2017   Testosterone deficiency 09/09/2017   OSA (obstructive sleep apnea) 09/09/2017   Chronic bilateral low back pain without sciatica 09/09/2017   H/O knee surgery 04/27/2014   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 Is now using a new cushion on his chair.   PERTINENT HISTORY:  Patient is a very pleasant 72 year old male presenting to physical therapy for chronic low back pain without sciatica. Patient has been seen by this therapist in the past. Patient additionally has relapsed extensive stage small cell lung cancer and  now multiple metastatic liver lesions diagnosed in April 2021 with history of radiotherapy and chemotherapy. PMH includes chickenpox COPD, CAD, HTN, GERD, OSA, type II DM, bilateral TKA. Patient tried figure four and stomach pressing up. Also having vertigo.   PAIN:  Are you having pain? 4/10 pain  PRECAUTIONS: Other:  back, cancer  , falls   WEIGHT BEARING RESTRICTIONS: No  FALLS:  Has patient fallen in last 6 months? No  OCCUPATION: retired Theatre stage manager  PLOF: Independent  PATIENT GOALS: to have less pain  NEXT MD VISIT: n/a  OBJECTIVE:     TODAY'S TREATMENT:                                                                                                                              DATE: 05/07/23  Goals performed: see below  Manual: Hamstring stretch 60 seconds each LE  Single knee to chest 60 seconds each LE Piriformis stretch 60 seconds each LE STM in sidelying to R lumbar paraspinals and quadratus lumborum x 13 minutes   PATIENT EDUCATION:  Assisted with modifications for stretches today to have more options for sitting activities and fewer needs to be in supine.    HOME EXERCISE PROGRAM: Anterior pelvic tilt 10x Hamstring stretch 30 seconds Lay prone 60 seconds   Access Code: [email or web address] URL: [email or web address] Date: 01/29/2023 Prepared by: Precious Bard  Exercises - Seated Anterior Pelvic Tilt  - 1 x daily - 7 x weekly - 2 sets - 10 reps - 5 hold - Supine Anterior Pelvic Tilt  - 1 x daily - 7 x weekly - 2 sets - 10 reps - 5 hold - Seated Hamstring Stretch  - 1 x daily - 7 x weekly - 2 sets - 2 reps - 30 hold - Supine Transversus Abdominis Bracing - Hands on Ground  - 1 x daily - 7 x weekly - 2 sets - 10 reps - 5 hold  ASSESSMENT:  CLINICAL IMPRESSION: Patient's gait speed is improving with increased capacity for standing. His pain levels in general are improving however he has had one episode of a major flare up. New goal added to POC of  MODI. His 5x STS is improving however he does continue to have pain with transition of movement. He is highly motivated for pain reduction mobility and to progress himself. Pt will benefit from skilled PT services to address deficits in order to improve overall QoL and return to PLOF.     OBJECTIVE IMPAIRMENTS: Abnormal gait, cardiopulmonary status limiting activity, decreased activity tolerance, decreased balance, decreased endurance, decreased mobility, difficulty walking, decreased ROM, decreased strength, hypomobility, impaired perceived functional ability, increased muscle spasms, impaired flexibility, impaired sensation, improper body mechanics, postural dysfunction, obesity, and pain.   ACTIVITY LIMITATIONS: carrying, lifting, bending, sitting, standing, squatting, sleeping, stairs, transfers, bed mobility, bathing, toileting, dressing, reach over head, hygiene/grooming, and locomotion level  PARTICIPATION LIMITATIONS: meal prep, cleaning, laundry, medication management, personal finances, interpersonal relationship, driving, shopping, community activity, occupation, yard work, and church  PERSONAL FACTORS: Age, Behavior pattern, Education, Financial risk analyst, Past/current experiences, Profession, Sex, Social background, Time since onset of injury/illness/exacerbation, and 3+ comorbidities: chickenpox COPD, CAD, HTN, GERD, OSA, type II DM, bilateral TKA  are also affecting patient's functional outcome.   REHAB POTENTIAL: Good  CLINICAL DECISION MAKING: Evolving/moderate complexity  EVALUATION COMPLEXITY: Moderate   GOALS: Goals reviewed with patient? Yes  SHORT TERM GOALS: Target date: 06/04/2023      Patient will be independent in home exercise program to improve strength/mobility for better functional independence with ADLs. Baseline: 2/12: given to patient 4/1: compliant Goal status: MET  2.  .  Patient will report a worst pain of  5/10 on VAS in low back to improve tolerance with ADLs  and reduced symptoms with activities. Baseline: 2/12: 10/10 4/1: 6/10  Goal status: Partially Met    LONG TERM GOALS: Target date: 07/02/2023     Patient will increase FOTO score to equal to or greater than 48%    to demonstrate statistically significant improvement in mobility and quality of life.  Baseline: 2/12: 36% 4/1: 37% 5/23: 43%  Goal status: Partially Met  2.  Patient will report a worst pain of 3/10 on VAS in low back to improve tolerance with ADLs and reduced symptoms with activities.  Baseline: 2/12: 10/10 4/1: 6/10  5/23: 9/10 due to flare: one time flare Goal status: Partially Met  3.  Patient will increase 10 meter walk test to >1.41m/s as to improve gait speed for better community ambulation and to reduce fall risk Baseline: 2/12: unable to ambulate 10 meters 4/1: 17.98 seconds with SPC 5/23: 14.8 seconds with SPC  Goal status: Partially Met   4.  Patient (> 39 years old) will complete five times sit to stand test in < 15 seconds indicating an increased LE strength and improved balance. Baseline: 2/12: unable to perform due to severe pain 4/1: 21 seconds with BUE support 5/23: 19 seconds with BUE support  Goal status: Partially Met  4.  Patient will reduce modified Oswestry score to <20 as to demonstrate minimal disability with ADLs including improved sleeping tolerance, walking/sitting tolerance etc for better mobility with ADLs.  Baseline: 5/23: 50%  Goal status: NEW    PLAN:  PT FREQUENCY: 2x/week  PT DURATION: 8 weeks  PLANNED INTERVENTIONS: Therapeutic exercises, Therapeutic activity, Neuromuscular re-education, Balance training, Gait training, Patient/Family education, Self Care, Joint mobilization, Stair training, Vestibular training, Canalith repositioning, Orthotic/Fit training, DME instructions, Electrical stimulation, Spinal manipulation, Spinal mobilization, Cryotherapy, Moist heat, Splintting, Taping, Vasopneumatic device, Traction, Ultrasound,  Manual therapy, and Re-evaluation.  PLAN FOR NEXT SESSION:  Reassess tolerance for LE strengthening  Precious Bard PT  12:26 PM, 05/07/23  Physical Therapist - [facility]  Outpatient Physical Therapy- Main [facility] [phone]     Physical Therapist- [facility]   05/07/23, 12:26 PM

## 2023-05-06 NOTE — Telephone Encounter (Signed)
Referral received and pt will be contacted for a "return appt." with Dr. Janann August.

## 2023-05-07 ENCOUNTER — Inpatient Hospital Stay: Payer: Medicare Other

## 2023-05-07 ENCOUNTER — Ambulatory Visit: Payer: Medicare Other

## 2023-05-07 DIAGNOSIS — M6281 Muscle weakness (generalized): Secondary | ICD-10-CM

## 2023-05-07 DIAGNOSIS — R262 Difficulty in walking, not elsewhere classified: Secondary | ICD-10-CM

## 2023-05-07 DIAGNOSIS — M5459 Other low back pain: Secondary | ICD-10-CM | POA: Diagnosis not present

## 2023-05-08 ENCOUNTER — Encounter: Payer: Self-pay | Admitting: Internal Medicine

## 2023-05-12 ENCOUNTER — Inpatient Hospital Stay: Payer: Medicare Other

## 2023-05-13 ENCOUNTER — Other Ambulatory Visit: Payer: Self-pay | Admitting: Primary Care

## 2023-05-13 ENCOUNTER — Ambulatory Visit: Payer: Self-pay | Admitting: Radiation Oncology

## 2023-05-13 ENCOUNTER — Encounter: Payer: Self-pay | Admitting: Internal Medicine

## 2023-05-13 DIAGNOSIS — E119 Type 2 diabetes mellitus without complications: Secondary | ICD-10-CM

## 2023-05-14 ENCOUNTER — Telehealth: Payer: Self-pay | Admitting: Medical Oncology

## 2023-05-14 ENCOUNTER — Ambulatory Visit: Payer: Medicare Other

## 2023-05-14 DIAGNOSIS — C3490 Malignant neoplasm of unspecified part of unspecified bronchus or lung: Secondary | ICD-10-CM | POA: Diagnosis not present

## 2023-05-14 NOTE — Telephone Encounter (Signed)
Pt  is having a virtual visit now with [facility] provider.

## 2023-05-15 ENCOUNTER — Encounter: Payer: Self-pay | Admitting: Internal Medicine

## 2023-05-18 ENCOUNTER — Ambulatory Visit: Payer: Medicare Other

## 2023-05-19 ENCOUNTER — Emergency Department (HOSPITAL_COMMUNITY)
Admission: EM | Admit: 2023-05-19 | Discharge: 2023-05-19 | Disposition: A | Discharge status: Home / Self Care | Payer: Medicare Other | Attending: Student | Admitting: Student

## 2023-05-19 ENCOUNTER — Encounter: Payer: Self-pay | Admitting: Internal Medicine

## 2023-05-19 ENCOUNTER — Encounter (HOSPITAL_COMMUNITY): Payer: Self-pay

## 2023-05-19 ENCOUNTER — Inpatient Hospital Stay: Payer: Medicare Other

## 2023-05-19 ENCOUNTER — Ambulatory Visit: Payer: Medicare Other | Admitting: Internal Medicine

## 2023-05-19 ENCOUNTER — Other Ambulatory Visit: Payer: Medicare Other

## 2023-05-19 ENCOUNTER — Emergency Department (HOSPITAL_COMMUNITY): Payer: Medicare Other

## 2023-05-19 ENCOUNTER — Ambulatory Visit: Payer: Medicare Other

## 2023-05-19 DIAGNOSIS — J479 Bronchiectasis, uncomplicated: Secondary | ICD-10-CM | POA: Diagnosis not present

## 2023-05-19 DIAGNOSIS — Z7901 Long term (current) use of anticoagulants: Secondary | ICD-10-CM | POA: Insufficient documentation

## 2023-05-19 DIAGNOSIS — E119 Type 2 diabetes mellitus without complications: Secondary | ICD-10-CM | POA: Diagnosis not present

## 2023-05-19 DIAGNOSIS — Z7984 Long term (current) use of oral hypoglycemic drugs: Secondary | ICD-10-CM | POA: Insufficient documentation

## 2023-05-19 DIAGNOSIS — E860 Dehydration: Secondary | ICD-10-CM | POA: Diagnosis not present

## 2023-05-19 DIAGNOSIS — J449 Chronic obstructive pulmonary disease, unspecified: Secondary | ICD-10-CM | POA: Insufficient documentation

## 2023-05-19 DIAGNOSIS — I10 Essential (primary) hypertension: Secondary | ICD-10-CM | POA: Insufficient documentation

## 2023-05-19 DIAGNOSIS — Z794 Long term (current) use of insulin: Secondary | ICD-10-CM | POA: Diagnosis not present

## 2023-05-19 DIAGNOSIS — M546 Pain in thoracic spine: Secondary | ICD-10-CM

## 2023-05-19 DIAGNOSIS — Z85118 Personal history of other malignant neoplasm of bronchus and lung: Secondary | ICD-10-CM | POA: Diagnosis not present

## 2023-05-19 DIAGNOSIS — Z87891 Personal history of nicotine dependence: Secondary | ICD-10-CM | POA: Insufficient documentation

## 2023-05-19 DIAGNOSIS — Z79899 Other long term (current) drug therapy: Secondary | ICD-10-CM | POA: Diagnosis not present

## 2023-05-19 DIAGNOSIS — Z96653 Presence of artificial knee joint, bilateral: Secondary | ICD-10-CM | POA: Diagnosis not present

## 2023-05-19 DIAGNOSIS — M549 Dorsalgia, unspecified: Secondary | ICD-10-CM | POA: Diagnosis not present

## 2023-05-19 DIAGNOSIS — C3490 Malignant neoplasm of unspecified part of unspecified bronchus or lung: Secondary | ICD-10-CM | POA: Diagnosis not present

## 2023-05-19 DIAGNOSIS — M542 Cervicalgia: Secondary | ICD-10-CM | POA: Diagnosis not present

## 2023-05-19 LAB — CBC WITH DIFFERENTIAL/PLATELET
Abs Immature Granulocytes: 0.03 10*3/uL (ref 0.00–0.07)
Basophils Absolute: 0 10*3/uL (ref 0.0–0.1)
Basophils Relative: 0 %
Eosinophils Absolute: 0 10*3/uL (ref 0.0–0.5)
Eosinophils Relative: 1 %
HCT: 26.7 % — ABNORMAL LOW (ref 39.0–52.0)
Hemoglobin: 8.4 g/dL — ABNORMAL LOW (ref 13.0–17.0)
Immature Granulocytes: 0 %
Lymphocytes Relative: 6 %
Lymphs Abs: 0.5 10*3/uL — ABNORMAL LOW (ref 0.7–4.0)
MCH: 32.2 pg (ref 26.0–34.0)
MCHC: 31.5 g/dL (ref 30.0–36.0)
MCV: 102.3 fL — ABNORMAL HIGH (ref 80.0–100.0)
Monocytes Absolute: 0.8 10*3/uL (ref 0.1–1.0)
Monocytes Relative: 11 %
Neutro Abs: 5.8 10*3/uL (ref 1.7–7.7)
Neutrophils Relative %: 82 %
Platelets: 167 10*3/uL (ref 150–400)
RBC: 2.61 MIL/uL — ABNORMAL LOW (ref 4.22–5.81)
RDW: 17 % — ABNORMAL HIGH (ref 11.5–15.5)
WBC: 7.2 10*3/uL (ref 4.0–10.5)
nRBC: 0 % (ref 0.0–0.2)

## 2023-05-19 LAB — COMPREHENSIVE METABOLIC PANEL
ALT: 26 U/L (ref 0–44)
AST: 26 U/L (ref 15–41)
Albumin: 2.6 g/dL — ABNORMAL LOW (ref 3.5–5.0)
Alkaline Phosphatase: 83 U/L (ref 38–126)
Anion gap: 8 (ref 5–15)
BUN: 14 mg/dL (ref 8–23)
CO2: 28 mmol/L (ref 22–32)
Calcium: 8.4 mg/dL — ABNORMAL LOW (ref 8.9–10.3)
Chloride: 99 mmol/L (ref 98–111)
Creatinine, Ser: 0.9 mg/dL (ref 0.61–1.24)
GFR, Estimated: >60 mL/min (ref >60)
Glucose, Bld: 122 mg/dL — ABNORMAL HIGH (ref 70–99)
Potassium: 3.5 mmol/L (ref 3.5–5.1)
Sodium: 135 mmol/L (ref 135–145)
Total Bilirubin: 0.6 mg/dL (ref 0.3–1.2)
Total Protein: 5.9 g/dL — ABNORMAL LOW (ref 6.5–8.1)

## 2023-05-19 MED ORDER — ONDANSETRON HCL 4 MG/2ML IJ SOLN
4.0000 mg | Freq: Once | INTRAMUSCULAR | Status: AC
  Administered 2023-05-19: 4 mg via INTRAVENOUS
  Filled 2023-05-19: qty 2

## 2023-05-19 MED ORDER — ACETAMINOPHEN 500 MG PO TABS: 1000.0000 mg | ORAL_TABLET | Freq: Three times a day (TID) | ORAL | 0 refills | Status: AC

## 2023-05-19 MED ORDER — ACETAMINOPHEN 500 MG PO TABS
1000.0000 mg | ORAL_TABLET | Freq: Once | ORAL | Status: AC
  Administered 2023-05-19: 1000 mg via ORAL
  Filled 2023-05-19: qty 2

## 2023-05-19 MED ORDER — LACTATED RINGERS IV BOLUS
1000.0000 mL | Freq: Once | INTRAVENOUS | Status: AC
  Administered 2023-05-19: 1000 mL via INTRAVENOUS

## 2023-05-19 MED ORDER — LIDOCAINE 5 % EX PTCH
1.0000 | MEDICATED_PATCH | CUTANEOUS | Status: DC
  Administered 2023-05-19: 1 via TRANSDERMAL
  Filled 2023-05-19: qty 1

## 2023-05-19 MED ORDER — HYDROMORPHONE HCL 1 MG/ML IJ SOLN
1.0000 mg | Freq: Once | INTRAMUSCULAR | Status: AC
  Administered 2023-05-19: 1 mg via INTRAVENOUS
  Filled 2023-05-19: qty 1

## 2023-05-19 MED ORDER — LIDOCAINE 5 % EX PTCH: 1.0000 | MEDICATED_PATCH | CUTANEOUS | 0 refills | Status: DC

## 2023-05-19 MED ORDER — OXYCODONE HCL 5 MG PO TABS: 5.0000 mg | ORAL_TABLET | ORAL | 0 refills | Status: DC | PRN

## 2023-05-19 MED ORDER — APIXABAN 5 MG PO TABS
5.0000 mg | ORAL_TABLET | Freq: Two times a day (BID) | ORAL | Status: DC
  Administered 2023-05-19: 5 mg via ORAL
  Filled 2023-05-19: qty 1

## 2023-05-19 MED ORDER — KETOROLAC TROMETHAMINE 15 MG/ML IJ SOLN
15.0000 mg | Freq: Once | INTRAMUSCULAR | Status: AC
  Administered 2023-05-19: 15 mg via INTRAVENOUS
  Filled 2023-05-19: qty 1

## 2023-05-19 MED ORDER — MORPHINE SULFATE (PF) 4 MG/ML IV SOLN
4.0000 mg | Freq: Once | INTRAVENOUS | Status: AC
  Administered 2023-05-19: 4 mg via INTRAVENOUS
  Filled 2023-05-19: qty 1

## 2023-05-19 MED ORDER — GABAPENTIN 300 MG PO CAPS
300.0000 mg | ORAL_CAPSULE | Freq: Two times a day (BID) | ORAL | Status: DC
  Administered 2023-05-19: 300 mg via ORAL
  Filled 2023-05-19: qty 1

## 2023-05-19 MED ORDER — IOHEXOL 300 MG/ML  SOLN
75.0000 mL | Freq: Once | INTRAMUSCULAR | Status: AC | PRN
  Administered 2023-05-19: 75 mL via INTRAVENOUS

## 2023-05-19 MED ORDER — HEPARIN SOD (PORK) LOCK FLUSH 100 UNIT/ML IV SOLN
500.0000 [IU] | Freq: Once | INTRAVENOUS | Status: AC
  Administered 2023-05-19: 500 [IU]
  Filled 2023-05-19: qty 5

## 2023-05-19 MED ORDER — SODIUM CHLORIDE (PF) 0.9 % IJ SOLN
INTRAMUSCULAR | Status: AC
  Filled 2023-05-19: qty 50

## 2023-05-19 NOTE — ED Triage Notes (Signed)
BIB [facility] from home c/o neck and upper back pain, "feels like nerve pain". Started 2 days ago after falling asleep on the couch. Denies trauma

## 2023-05-19 NOTE — ED Provider Notes (Signed)
[facility] AT [facility] Provider Note  CSN: [id number] Arrival date & time: 05/19/23 9562  Chief Complaint(s) Back Pain  HPI Adam Wise is a 72 y.o. 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  Patient states that he has a long history of lumbar back pain and is currently in physical therapy doing well with this.  However, 4 days ago, he states that he slept weirdly on his recliner and woke up with acute onset severe thoracic back pain.  This pain has been progressively worsening and now he feels that he is unable to move his arms around without significant pain.  He denies any numbness, tingling, weakness to the lower extremities.  No loss of bowel or bladder or saddle anesthesia.  Denies fever, chest pain, shortness of breath, abdominal pain, nausea, vomiting or other systemic symptoms.   Past Medical History Past Medical History:  Diagnosis Date   Chickenpox    Chronic knee pain    Chronic low back pain    COPD exacerbation (HCC) 10/11/2021   COPD with exacerbation (HCC) 10/12/2021   Coronary artery calcification seen on CAT scan 11/2021   Coronary CTA 11/27/2021: Moderate to large right pleural effusion and compressive atelectasis right lung base. => Coronary Calcium Score 657.  Diffuse RCA calcification.  Minimal mild disease in the LAD and diagonal branches. == Overall limited study.  Notable artifact.   Essential hypertension    GERD (gastroesophageal reflux disease)    Hyperlipidemia    Malignant neoplasm of upper lobe of right lung (HCC) 04/04/2020   OSA (obstructive sleep apnea)    With nighttime oxygen supplementation   T4, M3, M1 C Metastatic Small Cell Lung Cancer 03/2020   large right upper lobe/right hilar mass with ipsilateral and contralateral mediastinal and right supraclavicular lymphadenopathy in addition to multiple  liver lesios. He has disease progression after the first line of chemotherapy in December 2021.   Testosterone deficiency    Type 2 diabetes mellitus [facility])    Patient Active Problem List   Diagnosis Date Noted   Hip pain 12/09/2022   Dizziness 11/12/2022   Chronic respiratory failure with hypoxia (HCC) 09/18/2022   Pneumothorax 09/02/2022   Pneumothorax on right 09/01/2022   Recurrent pleural effusion 08/27/2022   Recurrent pleural effusion on right 01/16/2022   Preop cardiovascular exam 12/19/2021   Coronary Calcium Score 657 11/13/2021   Atypical chest pain 11/13/2021   Pleural effusion 10/29/2021   Acute on chronic respiratory failure with hypoxia (HCC) 10/12/2021   Recurrent right pleural effusion 08/08/2021   Erectile dysfunction 10/26/2020   Port-A-Cath in place 06/11/2020   History of pulmonary embolism 05/15/2020   Small cell lung cancer, right upper lobe (HCC) 04/12/2020   Chronic fatigue 04/12/2020   Encounter for antineoplastic chemotherapy 04/12/2020   Encounter for antineoplastic immunotherapy 04/12/2020   Malignant neoplasm of upper lobe of right lung (HCC) 04/04/2020   Goals of care, counseling/discussion 03/30/2020   Exertional dyspnea 03/26/2020   Hyperlipidemia associated with type 2 diabetes mellitus (HCC) 11/08/2019   Joint swelling 11/16/2018   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 Authorizing Provider  albuterol (VENTOLIN HFA) 108 (90 Base) MCG/ACT inhaler Inhale 2 puffs into the lungs every 6 (six) hours as needed  for wheezing or shortness of breath. 03/14/20   Arvilla Market, MD  amLODipine (NORVASC) 10 MG tablet TAKE 1 TABLET BY MOUTH EVERY DAY FOR BLOOD PRESSURE 04/30/23   Doreene Nest, NP  B-D 3CC LUER-LOK SYR 22GX1" 22G X 1" 3 ML MISC USE AS INSTRUCTED FOR TESTOSTERONE INJECTION EVERY 2 WEEKS 03/20/21   Doreene Nest, NP  bisoprolol (ZEBETA) 5 MG tablet TAKE 1 TABLET (5 MG TOTAL) BY MOUTH DAILY. 04/30/23   Marykay Lex, MD  Black Pepper-Turmeric (TURMERIC COMPLEX/BLACK PEPPER PO) Take 1 tablet by mouth in the morning and at bedtime.    [provider]  blood glucose meter kit and supplies KIT Dispense based on patient and insurance preference. Use up to four times daily as directed. 04/09/18   Doreene Nest, NP  chlorpheniramine-HYDROcodone (TUSSIONEX) 10-8 MG/5ML Take 5 mLs by mouth every 12 (twelve) hours as needed for cough. 03/30/23   Si Gaul, MD  Coenzyme Q10 (COQ10) 200 MG CAPS Take 200 mg by mouth at bedtime.    [provider]  cyclobenzaprine (FLEXERIL) 5 MG tablet Take 1 tablet (5 mg total) by mouth at bedtime as needed for muscle spasms. 03/26/23   Doreene Nest, NP  ELIQUIS 5 MG TABS tablet TAKE 1 TABLET BY MOUTH TWICE A DAY 05/03/23   Heilingoetter, Cassandra L, PA-C  esomeprazole (NEXIUM) 20 MG capsule Take 20 mg by mouth in the morning.    [provider]  gabapentin (NEURONTIN) 300 MG capsule TAKE 1 CAPSULE (300 MG TOTAL) BY MOUTH 2 (TWO) TIMES DAILY. FOR BACK PAIN. 09/07/22   Doreene Nest, NP  glipiZIDE (GLUCOTROL) 10 MG tablet TAKE 1 TABLET (10 MG TOTAL) BY MOUTH 2 (TWO) TIMES DAILY BEFORE A MEAL. FOR DIABETES. 05/14/23   Doreene Nest, NP  glucose blood (ONETOUCH ULTRA) test strip USE UP TO 4 TIMES DAILY AS DIRECTED 07/29/22   Doreene Nest, NP  Insulin Glargine (BASAGLAR KWIKPEN) 100 UNIT/ML INJECT 24 UNITS INTO THE SKIN AT BEDTIME. FOR DIABETES. 03/25/23   Doreene Nest, NP  Insulin Pen Needle (BD PEN  NEEDLE NANO U/F) 32G X 4 MM MISC Use with insulin as prescribed Dx Code: E11.9 05/15/22   Doreene Nest, NP  ipratropium-albuterol (DUONEB) 0.5-2.5 (3) MG/3ML SOLN Take 3 mLs by nebulization every 4 (four) hours while awake for 3 days, THEN 3 mLs every 4 (four) hours as needed (shortness of breath or wheezing). 10/13/21 01/18/24  Narda Bonds, MD  KRILL OIL PO Take 1 capsule by mouth daily.    [provider]  Lancets MISC USE UP TO 3 TIMES DAILY AS DIRECTED 05/06/18   Doreene Nest, NP  losartan (COZAAR) 50 MG tablet TAKE 1 TABLET (50 MG TOTAL) BY MOUTH DAILY. FOR BLOOD PRESSURE. 09/02/22   Doreene Nest, NP  Melatonin 10 MG CAPS Take 10 mg by mouth at bedtime.    [provider]  metFORMIN (GLUCOPHAGE) 1000 MG tablet TAKE 1 TABLET (1,000 MG TOTAL) BY MOUTH 2 (TWO) TIMES DAILY WITH A MEAL. FOR DIABETES 12/04/22   Doreene Nest, NP  methylPREDNISolone (MEDROL DOSEPAK) 4 MG TBPK tablet Use as instructed 05/05/23   Si Gaul, MD  Multiple  Vitamin (MULTI-VITAMINS) TABS Take 1 tablet by mouth in the morning.    [provider]  oxymetazoline (AFRIN) 0.05 % nasal spray Place 1 spray into both nostrils daily as needed for congestion.    [provider]  pioglitazone (ACTOS) 45 MG tablet TAKE 1 TABLET (45 MG TOTAL) BY MOUTH DAILY. FOR DIABETES. 12/04/22   Doreene Nest, NP  pravastatin (PRAVACHOL) 40 MG tablet TAKE 1 TABLET BY MOUTH EVERY DAY IN THE EVENING FOR CHOLESTEROL 09/07/22   Doreene Nest, NP  testosterone cypionate (DEPOTESTOSTERONE CYPIONATE) 200 MG/ML injection Inject 200 mg into the muscle every 14 (fourteen) days.    [provider]  traMADol (ULTRAM) 50 MG tablet Take 1 tablet (50 mg total) by mouth at bedtime as needed for severe pain. 01/21/23   Doreene Nest, NP                                                                                                                                    Past Surgical  History Past Surgical History:  Procedure Laterality Date   CHEST TUBE INSERTION Right 01/20/2022   Procedure: CHEST TUBE INSERTION;  Surgeon: Lorin Glass, MD;  Location: [facility] ENDOSCOPY;  Service: Pulmonary;  Laterality: Right;  w/ Talc Pleurodesis, planned admit for Obs afterwards   CHEST TUBE INSERTION Right 04/28/2022   Procedure: INSERTION PLEURAL DRAINAGE CATHETER - Pigtail tail, drainage;  Surgeon: Lorin Glass, MD;  Location: [facility] ENDOSCOPY;  Service: Pulmonary;  Laterality: Right;  talc pleurodesis   CHEST TUBE INSERTION Right 09/01/2022   Procedure: INSERTION PLEURAL DRAINAGE CATHETER;  Surgeon: Lupita Leash, MD;  Location: [facility] ENDOSCOPY;  Service: Cardiopulmonary;  Laterality: Right;  pigtail catheter placement with talc pleurodesis   COLONOSCOPY WITH PROPOFOL N/A 12/17/2018   Procedure: COLONOSCOPY WITH PROPOFOL;  Surgeon: Pasty Spillers, MD;  Location: [facility] ENDOSCOPY;  Service: Gastroenterology;  Laterality: N/A;   IR IMAGING GUIDED PORT INSERTION  04/17/2020   IR THORACENTESIS ASP PLEURAL SPACE W/IMG GUIDE  11/29/2021   IR THORACENTESIS ASP PLEURAL SPACE W/IMG GUIDE  01/03/2022   JOINT REPLACEMENT Bilateral    REPLACEMENT TOTAL KNEE BILATERAL  2015   TALC PLEURODESIS  09/01/2022   Procedure: Lurlean Nanny;  Surgeon: Lupita Leash, MD;  Location: [facility] ENDOSCOPY;  Service: Cardiopulmonary;;   TONSILLECTOMY  1960   TRANSTHORACIC ECHOCARDIOGRAM  05/2020   a) 05/2020: EF 55 to 60%.  No R WMA.  Mild LVH.  Indeterminate LVEDP.  Unable to assess RVP.  Normal aortic and mitral valves.  Mildly elevated RAP.; b) 09/2021: EF 50-55%. No RWMA. Mild LVH. ~ LVEDP. Mild LA Dilation. NORMAL RV/RAP.  Normal MV/AoV.   Family History Family History  Problem Relation Age of Onset   Heart attack Mother    Cancer Mother    Hypertension Mother    Arthritis Father    Asthma Father    Cancer Father  COPD Father    Heart attack Father    Hypertension Sister    Cancer Sister     Diabetes Sister    Asthma Sister    Cancer Sister    COPD Sister    Arthritis Sister    Asthma Sister    Diabetes Sister    Birth defects Maternal Grandfather    Arthritis Paternal Grandmother    Diabetes Paternal Grandmother    Arthritis Paternal Grandfather    Asthma Son    Other Neg Hx        pituitary disorder    Social History Social History   Tobacco Use   Smoking status: Former   Smokeless tobacco: Never  Building services engineer Use: Never used  Substance Use Topics   Alcohol use: Not Currently    Comment: rarely   Drug use: Not Currently   Allergies Bupropion and Hydrochlorothiazide  Review of Systems Review of Systems  Musculoskeletal:  Positive for back pain.    Physical Exam Vital Signs  I have reviewed the triage vital signs BP 113/76   Pulse 90   Temp 98.1 F (36.7 C) (Oral)   Resp 15   SpO2 95%   Physical Exam Vitals and nursing note reviewed.  Constitutional:      General: He is not in acute distress.    Appearance: He is well-developed. He is ill-appearing.  HENT:     Head: Normocephalic and atraumatic.  Eyes:     Conjunctiva/sclera: Conjunctivae normal.  Cardiovascular:     Rate and Rhythm: Normal rate and regular rhythm.     Heart sounds: No murmur heard. Pulmonary:     Effort: Pulmonary effort is normal. No respiratory distress.     Breath sounds: Normal breath sounds.  Abdominal:     Palpations: Abdomen is soft.     Tenderness: There is no abdominal tenderness.  Musculoskeletal:        General: Tenderness present. No swelling.     Cervical back: Neck supple.  Skin:    General: Skin is warm and dry.     Capillary Refill: Capillary refill takes less than 2 seconds.  Neurological:     Mental Status: He is alert.  Psychiatric:        Mood and Affect: Mood normal.     ED Results and Treatments Labs (all labs ordered are listed, but only abnormal results are displayed) Labs Reviewed  CBC WITH DIFFERENTIAL/PLATELET   COMPREHENSIVE METABOLIC PANEL                                                                                                                          Radiology No results found.  Pertinent labs & imaging results that were available during my care of the patient were reviewed by me and considered in my medical decision making (see MDM for details).  Medications Ordered in ED Medications  morphine (PF) 4 MG/ML injection 4 mg (has no administration in time  range)  ondansetron (ZOFRAN) injection 4 mg (has no administration in time range)  apixaban (ELIQUIS) tablet 5 mg (has no administration in time range)  gabapentin (NEURONTIN) capsule 300 mg (has no administration in time range)                                                                                                                                     Procedures Procedures  (including critical care time)  Medical Decision Making / ED Course   This patient presents to the ED for concern of ***, this involves an extensive number of treatment options, and is a complaint that carries with it a high risk of complications and morbidity.  The differential diagnosis includes ***  MDM: ***   Additional history obtained: -Additional history obtained from *** -External records from outside source obtained and reviewed including: Chart review including previous notes, labs, imaging, consultation notes   Lab Tests: -I ordered, reviewed, and interpreted labs.   The pertinent results include:   Labs Reviewed  CBC WITH DIFFERENTIAL/PLATELET  COMPREHENSIVE METABOLIC PANEL      EKG ***  EKG Interpretation  Date/Time:    Ventricular Rate:    PR Interval:    QRS Duration:   QT Interval:    QTC Calculation:   R Axis:     Text Interpretation:           Imaging Studies ordered: I ordered imaging studies including *** I independently visualized and interpreted imaging. I agree with the radiologist  interpretation   Medicines ordered and prescription drug management: Meds ordered this encounter  Medications   morphine (PF) 4 MG/ML injection 4 mg   ondansetron (ZOFRAN) injection 4 mg   apixaban (ELIQUIS) tablet 5 mg   gabapentin (NEURONTIN) capsule 300 mg    -I have reviewed the patients home medicines and have made adjustments as needed  Critical interventions ***  Consultations Obtained: I requested consultation with the ***,  and discussed lab and imaging findings as well as pertinent plan - they recommend: ***   Cardiac Monitoring: The patient was maintained on a cardiac monitor.  I personally viewed and interpreted the cardiac monitored which showed an underlying rhythm of: ***  Social Determinants of Health:  Factors impacting patients care include: ***   Reevaluation: After the interventions noted above, I reevaluated the patient and found that they have :{resolved/improved/worsened:23923::"improved"}  Co morbidities that complicate the patient evaluation  Past Medical History:  Diagnosis Date   Chickenpox    Chronic knee pain    Chronic low back pain    COPD exacerbation (HCC) 10/11/2021   COPD with exacerbation (HCC) 10/12/2021   Coronary artery calcification seen on CAT scan 11/2021   Coronary CTA 11/27/2021: Moderate to large right pleural effusion and compressive atelectasis right lung base. => Coronary Calcium Score 657.  Diffuse RCA calcification.  Minimal mild disease in the LAD and diagonal branches. ==  Overall limited study.  Notable artifact.   Essential hypertension    GERD (gastroesophageal reflux disease)    Hyperlipidemia    Malignant neoplasm of upper lobe of right lung (HCC) 04/04/2020   OSA (obstructive sleep apnea)    With nighttime oxygen supplementation   T4, M3, M1 C Metastatic Small Cell Lung Cancer 03/2020   large right upper lobe/right hilar mass with ipsilateral and contralateral mediastinal and right supraclavicular lymphadenopathy  in addition to multiple liver lesios. He has disease progression after the first line of chemotherapy in December 2021.   Testosterone deficiency    Type 2 diabetes mellitus (HCC)       Dispostion: I considered admission for this patient, ***     Final Clinical Impression(s) / ED Diagnoses Final diagnoses:  None     @PCDICTATION @

## 2023-05-19 NOTE — Discharge Instructions (Addendum)
For pain:  - Acetaminophen 1000 mg three times daily (every 8 hours) - lidoderm patches twice daily - oxycodone for breakthrough pain only

## 2023-05-20 ENCOUNTER — Other Ambulatory Visit: Payer: Self-pay | Admitting: Primary Care

## 2023-05-20 ENCOUNTER — Ambulatory Visit: Payer: Medicare Other

## 2023-05-20 DIAGNOSIS — I10 Essential (primary) hypertension: Secondary | ICD-10-CM

## 2023-05-20 DIAGNOSIS — E1165 Type 2 diabetes mellitus with hyperglycemia: Secondary | ICD-10-CM

## 2023-05-20 DIAGNOSIS — E119 Type 2 diabetes mellitus without complications: Secondary | ICD-10-CM

## 2023-05-20 DIAGNOSIS — E1169 Type 2 diabetes mellitus with other specified complication: Secondary | ICD-10-CM

## 2023-05-20 MED ORDER — BD PEN NEEDLE NANO U/F 32G X 4 MM MISC
3 refills | Status: DC
Start: 2023-05-20 — End: 2024-10-21

## 2023-05-21 ENCOUNTER — Ambulatory Visit: Payer: Medicare Other

## 2023-05-22 ENCOUNTER — Telehealth: Payer: Self-pay | Admitting: *Deleted

## 2023-05-22 NOTE — Telephone Encounter (Signed)
Transition Care Management Unsuccessful Follow-up Telephone Call  Date of discharge and from where:  [facility] 05[facility]19/2023  Attempts:  1st Attempt  Reason for unsuccessful TCM follow-up call:  Left voice message

## 2023-05-25 ENCOUNTER — Telehealth: Payer: Self-pay | Admitting: *Deleted

## 2023-05-25 ENCOUNTER — Ambulatory Visit: Payer: Medicare Other

## 2023-05-25 NOTE — Telephone Encounter (Signed)
Transition Care Management Unsuccessful Follow-up Telephone Call  Date of discharge and from where:  [facility] 05/19/2023  Attempts:  2nd Attempt  Reason for unsuccessful TCM follow-up call:  Left voice message

## 2023-05-26 ENCOUNTER — Ambulatory Visit: Payer: Medicare Other | Admitting: Physician Assistant

## 2023-05-26 ENCOUNTER — Ambulatory Visit: Payer: Medicare Other

## 2023-05-26 ENCOUNTER — Other Ambulatory Visit: Payer: Medicare Other

## 2023-05-26 DIAGNOSIS — Z7984 Long term (current) use of oral hypoglycemic drugs: Secondary | ICD-10-CM | POA: Diagnosis not present

## 2023-05-26 DIAGNOSIS — Z5112 Encounter for antineoplastic immunotherapy: Secondary | ICD-10-CM | POA: Diagnosis not present

## 2023-05-26 DIAGNOSIS — C229 Malignant neoplasm of liver, not specified as primary or secondary: Secondary | ICD-10-CM | POA: Diagnosis not present

## 2023-05-26 DIAGNOSIS — Z79899 Other long term (current) drug therapy: Secondary | ICD-10-CM | POA: Diagnosis not present

## 2023-05-26 DIAGNOSIS — Z7901 Long term (current) use of anticoagulants: Secondary | ICD-10-CM | POA: Diagnosis not present

## 2023-05-26 DIAGNOSIS — R000 Tachycardia, unspecified: Secondary | ICD-10-CM | POA: Diagnosis present

## 2023-05-26 DIAGNOSIS — R0902 Hypoxemia: Secondary | ICD-10-CM | POA: Diagnosis present

## 2023-05-26 DIAGNOSIS — R32 Unspecified urinary incontinence: Secondary | ICD-10-CM | POA: Diagnosis not present

## 2023-05-26 DIAGNOSIS — C3411 Malignant neoplasm of upper lobe, right bronchus or lung: Secondary | ICD-10-CM | POA: Diagnosis present

## 2023-05-26 DIAGNOSIS — T451X5A Adverse effect of antineoplastic and immunosuppressive drugs, initial encounter: Secondary | ICD-10-CM | POA: Diagnosis not present

## 2023-05-26 DIAGNOSIS — I441 Atrioventricular block, second degree: Secondary | ICD-10-CM | POA: Diagnosis present

## 2023-05-26 DIAGNOSIS — Z86711 Personal history of pulmonary embolism: Secondary | ICD-10-CM | POA: Diagnosis not present

## 2023-05-26 DIAGNOSIS — R4182 Altered mental status, unspecified: Secondary | ICD-10-CM | POA: Diagnosis not present

## 2023-05-26 DIAGNOSIS — I10 Essential (primary) hypertension: Secondary | ICD-10-CM | POA: Diagnosis present

## 2023-05-26 DIAGNOSIS — E119 Type 2 diabetes mellitus without complications: Secondary | ICD-10-CM | POA: Diagnosis present

## 2023-05-26 DIAGNOSIS — M545 Low back pain, unspecified: Secondary | ICD-10-CM | POA: Diagnosis present

## 2023-05-26 DIAGNOSIS — T8089XA Other complications following infusion, transfusion and therapeutic injection, initial encounter: Secondary | ICD-10-CM | POA: Diagnosis not present

## 2023-05-26 DIAGNOSIS — D89832 Cytokine release syndrome, grade 2: Secondary | ICD-10-CM | POA: Diagnosis not present

## 2023-05-26 DIAGNOSIS — C3490 Malignant neoplasm of unspecified part of unspecified bronchus or lung: Secondary | ICD-10-CM | POA: Diagnosis not present

## 2023-05-26 DIAGNOSIS — G9202 Immune effector cell-associated neurotoxicity syndrome, grade 2: Secondary | ICD-10-CM | POA: Diagnosis not present

## 2023-05-26 DIAGNOSIS — G4733 Obstructive sleep apnea (adult) (pediatric): Secondary | ICD-10-CM | POA: Diagnosis present

## 2023-05-26 DIAGNOSIS — I440 Atrioventricular block, first degree: Secondary | ICD-10-CM | POA: Diagnosis not present

## 2023-05-27 ENCOUNTER — Ambulatory Visit: Payer: Medicare Other

## 2023-05-27 DIAGNOSIS — G9202 Immune effector cell-associated neurotoxicity syndrome, grade 2: Secondary | ICD-10-CM | POA: Diagnosis not present

## 2023-05-27 DIAGNOSIS — C229 Malignant neoplasm of liver, not specified as primary or secondary: Secondary | ICD-10-CM | POA: Diagnosis not present

## 2023-05-27 DIAGNOSIS — R0902 Hypoxemia: Secondary | ICD-10-CM | POA: Diagnosis present

## 2023-05-27 DIAGNOSIS — E119 Type 2 diabetes mellitus without complications: Secondary | ICD-10-CM | POA: Diagnosis present

## 2023-05-27 DIAGNOSIS — T8089XA Other complications following infusion, transfusion and therapeutic injection, initial encounter: Secondary | ICD-10-CM | POA: Diagnosis not present

## 2023-05-27 DIAGNOSIS — R32 Unspecified urinary incontinence: Secondary | ICD-10-CM | POA: Diagnosis not present

## 2023-05-27 DIAGNOSIS — Z5112 Encounter for antineoplastic immunotherapy: Secondary | ICD-10-CM | POA: Diagnosis not present

## 2023-05-27 DIAGNOSIS — R4182 Altered mental status, unspecified: Secondary | ICD-10-CM | POA: Diagnosis not present

## 2023-05-27 DIAGNOSIS — C3490 Malignant neoplasm of unspecified part of unspecified bronchus or lung: Secondary | ICD-10-CM | POA: Diagnosis not present

## 2023-05-27 DIAGNOSIS — R000 Tachycardia, unspecified: Secondary | ICD-10-CM | POA: Diagnosis present

## 2023-05-27 DIAGNOSIS — D89832 Cytokine release syndrome, grade 2: Secondary | ICD-10-CM | POA: Diagnosis not present

## 2023-05-27 DIAGNOSIS — Z86711 Personal history of pulmonary embolism: Secondary | ICD-10-CM | POA: Diagnosis not present

## 2023-05-27 DIAGNOSIS — G4733 Obstructive sleep apnea (adult) (pediatric): Secondary | ICD-10-CM | POA: Diagnosis present

## 2023-05-27 DIAGNOSIS — I440 Atrioventricular block, first degree: Secondary | ICD-10-CM | POA: Diagnosis not present

## 2023-05-27 DIAGNOSIS — I441 Atrioventricular block, second degree: Secondary | ICD-10-CM | POA: Diagnosis present

## 2023-05-27 DIAGNOSIS — C3411 Malignant neoplasm of upper lobe, right bronchus or lung: Secondary | ICD-10-CM | POA: Diagnosis present

## 2023-05-27 DIAGNOSIS — M545 Low back pain, unspecified: Secondary | ICD-10-CM | POA: Diagnosis present

## 2023-05-27 DIAGNOSIS — T451X5A Adverse effect of antineoplastic and immunosuppressive drugs, initial encounter: Secondary | ICD-10-CM | POA: Diagnosis not present

## 2023-05-27 DIAGNOSIS — I10 Essential (primary) hypertension: Secondary | ICD-10-CM | POA: Diagnosis present

## 2023-05-27 DIAGNOSIS — Z7984 Long term (current) use of oral hypoglycemic drugs: Secondary | ICD-10-CM | POA: Diagnosis not present

## 2023-05-27 DIAGNOSIS — Z7901 Long term (current) use of anticoagulants: Secondary | ICD-10-CM | POA: Diagnosis not present

## 2023-05-27 DIAGNOSIS — Z79899 Other long term (current) drug therapy: Secondary | ICD-10-CM | POA: Diagnosis not present

## 2023-05-28 ENCOUNTER — Other Ambulatory Visit: Payer: Self-pay | Admitting: Primary Care

## 2023-05-28 ENCOUNTER — Ambulatory Visit: Payer: Medicare Other

## 2023-05-28 DIAGNOSIS — E785 Hyperlipidemia, unspecified: Secondary | ICD-10-CM

## 2023-05-28 DIAGNOSIS — C229 Malignant neoplasm of liver, not specified as primary or secondary: Secondary | ICD-10-CM | POA: Diagnosis not present

## 2023-05-28 DIAGNOSIS — R4182 Altered mental status, unspecified: Secondary | ICD-10-CM | POA: Diagnosis not present

## 2023-06-01 ENCOUNTER — Ambulatory Visit: Payer: Medicare Other

## 2023-06-01 DIAGNOSIS — C3490 Malignant neoplasm of unspecified part of unspecified bronchus or lung: Secondary | ICD-10-CM | POA: Diagnosis not present

## 2023-06-02 DIAGNOSIS — K219 Gastro-esophageal reflux disease without esophagitis: Secondary | ICD-10-CM | POA: Diagnosis not present

## 2023-06-02 DIAGNOSIS — J90 Pleural effusion, not elsewhere classified: Secondary | ICD-10-CM | POA: Diagnosis not present

## 2023-06-02 DIAGNOSIS — Z6841 Body Mass Index (BMI) 40.0 and over, adult: Secondary | ICD-10-CM | POA: Diagnosis not present

## 2023-06-02 DIAGNOSIS — Z794 Long term (current) use of insulin: Secondary | ICD-10-CM | POA: Diagnosis not present

## 2023-06-02 DIAGNOSIS — Z79899 Other long term (current) drug therapy: Secondary | ICD-10-CM | POA: Diagnosis not present

## 2023-06-02 DIAGNOSIS — E785 Hyperlipidemia, unspecified: Secondary | ICD-10-CM | POA: Diagnosis not present

## 2023-06-02 DIAGNOSIS — Z5111 Encounter for antineoplastic chemotherapy: Secondary | ICD-10-CM | POA: Diagnosis not present

## 2023-06-02 DIAGNOSIS — Z86711 Personal history of pulmonary embolism: Secondary | ICD-10-CM | POA: Diagnosis not present

## 2023-06-02 DIAGNOSIS — G4733 Obstructive sleep apnea (adult) (pediatric): Secondary | ICD-10-CM | POA: Diagnosis not present

## 2023-06-02 DIAGNOSIS — R0600 Dyspnea, unspecified: Secondary | ICD-10-CM | POA: Diagnosis not present

## 2023-06-02 DIAGNOSIS — Z7984 Long term (current) use of oral hypoglycemic drugs: Secondary | ICD-10-CM | POA: Diagnosis not present

## 2023-06-02 DIAGNOSIS — C3411 Malignant neoplasm of upper lobe, right bronchus or lung: Secondary | ICD-10-CM | POA: Diagnosis not present

## 2023-06-02 DIAGNOSIS — R000 Tachycardia, unspecified: Secondary | ICD-10-CM | POA: Diagnosis not present

## 2023-06-02 DIAGNOSIS — Z7901 Long term (current) use of anticoagulants: Secondary | ICD-10-CM | POA: Diagnosis not present

## 2023-06-02 DIAGNOSIS — I10 Essential (primary) hypertension: Secondary | ICD-10-CM | POA: Diagnosis not present

## 2023-06-02 DIAGNOSIS — E1140 Type 2 diabetes mellitus with diabetic neuropathy, unspecified: Secondary | ICD-10-CM | POA: Diagnosis not present

## 2023-06-04 ENCOUNTER — Ambulatory Visit: Payer: Medicare Other

## 2023-06-08 ENCOUNTER — Ambulatory Visit: Payer: Medicare Other

## 2023-06-11 ENCOUNTER — Ambulatory Visit: Payer: Medicare Other

## 2023-06-15 DIAGNOSIS — C3411 Malignant neoplasm of upper lobe, right bronchus or lung: Secondary | ICD-10-CM | POA: Diagnosis not present

## 2023-06-16 ENCOUNTER — Ambulatory Visit: Payer: Medicare Other

## 2023-06-29 DIAGNOSIS — M549 Dorsalgia, unspecified: Secondary | ICD-10-CM | POA: Diagnosis not present

## 2023-06-29 DIAGNOSIS — Z87891 Personal history of nicotine dependence: Secondary | ICD-10-CM | POA: Diagnosis not present

## 2023-06-29 DIAGNOSIS — C787 Secondary malignant neoplasm of liver and intrahepatic bile duct: Secondary | ICD-10-CM | POA: Diagnosis not present

## 2023-06-29 DIAGNOSIS — Z5112 Encounter for antineoplastic immunotherapy: Secondary | ICD-10-CM | POA: Diagnosis not present

## 2023-06-29 DIAGNOSIS — C3411 Malignant neoplasm of upper lobe, right bronchus or lung: Secondary | ICD-10-CM | POA: Diagnosis not present

## 2023-06-29 DIAGNOSIS — G8929 Other chronic pain: Secondary | ICD-10-CM | POA: Diagnosis not present

## 2023-06-29 DIAGNOSIS — Z803 Family history of malignant neoplasm of breast: Secondary | ICD-10-CM | POA: Diagnosis not present

## 2023-06-29 DIAGNOSIS — I10 Essential (primary) hypertension: Secondary | ICD-10-CM | POA: Diagnosis not present

## 2023-07-06 ENCOUNTER — Ambulatory Visit: Payer: Medicare Other

## 2023-07-06 ENCOUNTER — Encounter: Payer: Self-pay | Admitting: Pulmonary Disease

## 2023-07-06 DIAGNOSIS — G4733 Obstructive sleep apnea (adult) (pediatric): Secondary | ICD-10-CM

## 2023-07-08 DIAGNOSIS — G930 Cerebral cysts: Secondary | ICD-10-CM | POA: Diagnosis not present

## 2023-07-08 DIAGNOSIS — C3411 Malignant neoplasm of upper lobe, right bronchus or lung: Secondary | ICD-10-CM | POA: Diagnosis not present

## 2023-07-09 ENCOUNTER — Ambulatory Visit: Payer: Medicare Other

## 2023-07-10 ENCOUNTER — Other Ambulatory Visit: Payer: Self-pay | Admitting: Primary Care

## 2023-07-10 DIAGNOSIS — J8410 Pulmonary fibrosis, unspecified: Secondary | ICD-10-CM | POA: Diagnosis not present

## 2023-07-10 DIAGNOSIS — M545 Low back pain, unspecified: Secondary | ICD-10-CM

## 2023-07-10 DIAGNOSIS — C3411 Malignant neoplasm of upper lobe, right bronchus or lung: Secondary | ICD-10-CM | POA: Diagnosis not present

## 2023-07-10 DIAGNOSIS — C787 Secondary malignant neoplasm of liver and intrahepatic bile duct: Secondary | ICD-10-CM | POA: Diagnosis not present

## 2023-07-10 DIAGNOSIS — J90 Pleural effusion, not elsewhere classified: Secondary | ICD-10-CM | POA: Diagnosis not present

## 2023-07-10 DIAGNOSIS — K828 Other specified diseases of gallbladder: Secondary | ICD-10-CM | POA: Diagnosis not present

## 2023-07-10 DIAGNOSIS — I700 Atherosclerosis of aorta: Secondary | ICD-10-CM | POA: Diagnosis not present

## 2023-07-10 DIAGNOSIS — I2510 Atherosclerotic heart disease of native coronary artery without angina pectoris: Secondary | ICD-10-CM | POA: Diagnosis not present

## 2023-07-10 DIAGNOSIS — K429 Umbilical hernia without obstruction or gangrene: Secondary | ICD-10-CM | POA: Diagnosis not present

## 2023-07-10 DIAGNOSIS — N2889 Other specified disorders of kidney and ureter: Secondary | ICD-10-CM | POA: Diagnosis not present

## 2023-07-10 NOTE — Telephone Encounter (Signed)
It is fine for new order . No recent visit for sleep apnea or download. If we could get download . DME usually requires. May need to do virtual visit for compliance check for order.  Please send order for same setting on CPAP

## 2023-07-13 ENCOUNTER — Ambulatory Visit: Payer: Medicare Other

## 2023-07-13 DIAGNOSIS — C3411 Malignant neoplasm of upper lobe, right bronchus or lung: Secondary | ICD-10-CM | POA: Diagnosis not present

## 2023-07-13 DIAGNOSIS — Z5111 Encounter for antineoplastic chemotherapy: Secondary | ICD-10-CM | POA: Diagnosis not present

## 2023-07-14 DIAGNOSIS — J90 Pleural effusion, not elsewhere classified: Secondary | ICD-10-CM | POA: Diagnosis not present

## 2023-07-14 DIAGNOSIS — C3411 Malignant neoplasm of upper lobe, right bronchus or lung: Secondary | ICD-10-CM | POA: Diagnosis not present

## 2023-07-14 DIAGNOSIS — M47814 Spondylosis without myelopathy or radiculopathy, thoracic region: Secondary | ICD-10-CM | POA: Diagnosis not present

## 2023-07-14 DIAGNOSIS — M5134 Other intervertebral disc degeneration, thoracic region: Secondary | ICD-10-CM | POA: Diagnosis not present

## 2023-07-14 DIAGNOSIS — C3490 Malignant neoplasm of unspecified part of unspecified bronchus or lung: Secondary | ICD-10-CM | POA: Diagnosis not present

## 2023-07-15 ENCOUNTER — Encounter (INDEPENDENT_AMBULATORY_CARE_PROVIDER_SITE_OTHER): Payer: Self-pay

## 2023-07-16 ENCOUNTER — Ambulatory Visit: Payer: Medicare Other

## 2023-07-20 ENCOUNTER — Ambulatory Visit: Payer: Medicare Other

## 2023-07-21 ENCOUNTER — Ambulatory Visit: Payer: Medicare Other | Admitting: Primary Care

## 2023-07-21 ENCOUNTER — Other Ambulatory Visit: Payer: Self-pay | Admitting: Primary Care

## 2023-07-21 ENCOUNTER — Other Ambulatory Visit: Payer: Self-pay | Admitting: Cardiology

## 2023-07-21 DIAGNOSIS — M545 Low back pain, unspecified: Secondary | ICD-10-CM

## 2023-07-23 ENCOUNTER — Ambulatory Visit: Payer: Medicare Other

## 2023-07-27 ENCOUNTER — Ambulatory Visit: Payer: Medicare Other

## 2023-07-27 DIAGNOSIS — Z5112 Encounter for antineoplastic immunotherapy: Secondary | ICD-10-CM | POA: Diagnosis not present

## 2023-07-27 DIAGNOSIS — C3411 Malignant neoplasm of upper lobe, right bronchus or lung: Secondary | ICD-10-CM | POA: Diagnosis not present

## 2023-07-28 ENCOUNTER — Encounter: Payer: Self-pay | Admitting: Primary Care

## 2023-07-28 ENCOUNTER — Ambulatory Visit: Payer: Medicare Other | Admitting: Primary Care

## 2023-07-28 VITALS — BP 102/62 | HR 106 | Temp 97.6°F | Ht 69.0 in | Wt 285.2 lb

## 2023-07-28 DIAGNOSIS — E1169 Type 2 diabetes mellitus with other specified complication: Secondary | ICD-10-CM | POA: Diagnosis not present

## 2023-07-28 DIAGNOSIS — Z794 Long term (current) use of insulin: Secondary | ICD-10-CM

## 2023-07-28 DIAGNOSIS — Z86711 Personal history of pulmonary embolism: Secondary | ICD-10-CM | POA: Diagnosis not present

## 2023-07-28 DIAGNOSIS — E785 Hyperlipidemia, unspecified: Secondary | ICD-10-CM | POA: Diagnosis not present

## 2023-07-28 DIAGNOSIS — M545 Low back pain, unspecified: Secondary | ICD-10-CM | POA: Diagnosis not present

## 2023-07-28 DIAGNOSIS — K219 Gastro-esophageal reflux disease without esophagitis: Secondary | ICD-10-CM

## 2023-07-28 DIAGNOSIS — C3411 Malignant neoplasm of upper lobe, right bronchus or lung: Secondary | ICD-10-CM

## 2023-07-28 DIAGNOSIS — G8929 Other chronic pain: Secondary | ICD-10-CM | POA: Diagnosis not present

## 2023-07-28 DIAGNOSIS — E230 Hypopituitarism: Secondary | ICD-10-CM

## 2023-07-28 DIAGNOSIS — J9611 Chronic respiratory failure with hypoxia: Secondary | ICD-10-CM | POA: Diagnosis not present

## 2023-07-28 DIAGNOSIS — I10 Essential (primary) hypertension: Secondary | ICD-10-CM

## 2023-07-28 DIAGNOSIS — E1165 Type 2 diabetes mellitus with hyperglycemia: Secondary | ICD-10-CM | POA: Diagnosis not present

## 2023-07-28 DIAGNOSIS — G4733 Obstructive sleep apnea (adult) (pediatric): Secondary | ICD-10-CM

## 2023-07-28 LAB — POCT GLYCOSYLATED HEMOGLOBIN (HGB A1C): Hemoglobin A1C: 6.3 % — AB (ref 4.0–5.6)

## 2023-07-28 NOTE — Patient Instructions (Signed)
We reduced the dose of your glipizide to 5 mg twice daily for diabetes.  Start checking your blood sugar levels.  Appropriate times to check your blood sugar levels are:  -Before any meal (breakfast, lunch, dinner) -Two hours after any meal (breakfast, lunch, dinner) -Bedtime  Record your readings and notify me if you continue to consistently run at or above 150.   Set up a lab appointment for 3 months to recheck A1C for diabetes.  Please schedule a follow up visit for 6 months for a diabetes check.  It was a pleasure to see you today!

## 2023-07-28 NOTE — Assessment & Plan Note (Signed)
Continue CPAP machine HS.

## 2023-07-28 NOTE — Assessment & Plan Note (Addendum)
Chronic and continued. Following with [facility] oncology.  His wife will update later regarding his current mediation regimen.

## 2023-07-28 NOTE — Assessment & Plan Note (Addendum)
Lower than usual. Home readings are the same.   Stop bisoprolol 5 mg daily.  Continue losartan 50 mg daily, amlodipine 10 mg daily. Reviewed CMP from yesterday.   Continue to monitor.

## 2023-07-28 NOTE — Assessment & Plan Note (Signed)
Controlled.  Continue Nexium 20 mg daily.  

## 2023-07-28 NOTE — Assessment & Plan Note (Signed)
Following with pulmonology and oncology.  Continue supplemental oxygen PRN, CPAP HS. Reviewed office notes from October 2023.

## 2023-07-28 NOTE — Assessment & Plan Note (Signed)
Improved with A1C of 6.3 today.  Continue metformin 1000 mg BID, Actos 45 mg daily, Basaglar 20-26 HS. Reduce Glipizide to 5 mg BID.  Recommended daily glucose checks. Would discontinue glipizide if A1C remains the same.  Repeat A1C in 3 months. Follow up in 6 months.

## 2023-07-28 NOTE — Assessment & Plan Note (Signed)
Following with urology.  Continue testosterone 200 mg every 14 days.  Reviewed testosterone level from April 2024

## 2023-07-28 NOTE — Progress Notes (Signed)
Subjective:    Patient ID: Adam Wise, male    DOB: 1951/05/03, 72 y.o.   MRN: [id number]  HPI  Adam Wise is a very pleasant 72 y.o. 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  1) Small Cell Lung Cancer/Chronic Respiratory Failure/Recurrent Pleural Effusion: Following with oncology, last office visit was in May 2024.  Previously completed radiotherapy, multiple rounds of systemic chemotherapy, Zepzelca IV every 3 weeks, SBRT to progressive liver lesions.  Last treatment was systemic chemotherapy in April 2024, discontinued for disease progression.  Currently considering Tarlatamab at [facility].   Currently managed on Eliquis 5 mg BID for pulmonary embolism.   Also managed on supplemental oxygen PRN, CPAP at night.   Now following with oncology through [facility] and is managed on Tarlatamab infusions every 2 weeks.   He completed his last colonoscopy in January 2020, due 2023, he prefers not to complete for another colonoscopy.   2) Type 2 Diabetes:  Current medications include: Basaglar 24 units at bedtime, glipizide 10 mg twice daily, pioglitazone 45 mg daily, metformin 1000 milligrams twice daily  He is checking his blood glucose on occasion.  He is now off steroids. Glucose readings are AM fasting in the low 100s.  Last A1C: 6.5 in April 2024 Last Eye Exam: Due Last Foot Exam: Due Pneumonia Vaccination: Last completed in 2019 Urine Microalbumin: Up-to-date Statin: Pravastatin  Dietary changes since last visit:   Exercise: None  3) CAD/Hyperlipidemia/Hypertension; Currently managed on losartan 50 mg daily, pravastatin 40 mg daily, bisoprolol 5 mg daily, amlodipine 10 mg daily.  Lipid panel from April 2024  with LDL of 40.   He denies chest pain, headaches. He does experience dizziness with positional changes. He is checking BP at home which are in the low 100s/60.   4) Chronic Back Pain: Currently managed on gabapentin 300 mg twice daily, Tramadol 50 mg daily PRN. He's also tried Percocet, oxycodone, morphine.    He continues to experience chronic back pain. He is managed per [facility] oncology.   BP Readings from Last 3 Encounters:  07/28/23 102/62  05/19/23 118/83  05/05/23 101/66       Review of Systems  Respiratory:  Positive for shortness of breath.   Cardiovascular:  Negative for chest pain.  Gastrointestinal:  Negative for constipation and diarrhea.  Neurological:  Positive for dizziness. Negative for headaches.  Psychiatric/Behavioral:  The patient is not nervous/anxious.          Past Medical History:  Diagnosis Date   Acute on chronic respiratory failure with hypoxia (HCC) 10/12/2021   Chickenpox    Chronic knee pain    Chronic low back pain    COPD exacerbation (HCC) 10/11/2021   COPD with exacerbation (HCC) 10/12/2021   Coronary artery calcification seen on CAT scan 11/2021   Coronary CTA 11/27/2021: Moderate to large right pleural effusion and compressive atelectasis right lung base. => Coronary Calcium Score 657.  Diffuse RCA calcification.  Minimal mild disease in the LAD and diagonal branches. == Overall limited study.  Notable artifact.   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 He has disease progression after the first line of chemotherapy in December 2021.    Testosterone deficiency    Type 2 diabetes mellitus (HCC)     Social History   Socioeconomic History   Marital status: Married    Spouse name: Not on file   Number of children: Not on file   Years of education: Not on file   Highest education level: 12th grade  Occupational History   Not on file  Tobacco Use   Smoking status: Former   Smokeless tobacco: Never  Vaping Use   Vaping status: Never Used  Substance and Sexual Activity   Alcohol use: Not Currently    Comment: rarely   Drug use: Not Currently   Sexual activity: Yes  Other Topics Concern   Not on file  Social History Narrative   Married.   Moved from [place].   Retired.   Social Determinants of Health   Financial Resource Strain: Low Risk  (07/27/2023)   Overall Financial Resource Strain (CARDIA)    Difficulty of Paying Living Expenses: Not hard at all  Food Insecurity: No Food Insecurity (07/27/2023)   Hunger Vital Sign    Worried About Running Out of Food in the Last Year: Never true    Ran Out of Food in the Last Year: Never true  Transportation Needs: No Transportation Needs (07/27/2023)   PRAPARE - Administrator, Civil Service (Medical): No    Lack of Transportation (Non-Medical): No  Physical Activity: Inactive (07/27/2023)   Exercise Vital Sign    Days of Exercise per Week: 0 days    Minutes of Exercise per Session: 0 min  Stress: No Stress Concern Present (07/27/2023)   [employer] of Occupational Health - Occupational Stress Questionnaire    Feeling of Stress : Only a little  Social Connections: Moderately Isolated (07/27/2023)   Social Connection and Isolation Panel [NHANES]    Frequency of Communication with Friends and Family: Once a week    Frequency of Social Gatherings with Friends and Family: Once a week    Attends Religious Services: More than 4 times per year    Active Member of [employer] or Organizations: No    Attends Banker Meetings: Never    Marital Status:  Married  Catering manager Violence: Not At Risk (02/25/2023)   Humiliation, Afraid, Rape, and Kick questionnaire    Fear of Current or Ex-Partner: No    Emotionally Abused: No    Physically Abused: No    Sexually Abused: No    Past Surgical History:  Procedure Laterality Date   CHEST TUBE INSERTION Right 01/20/2022   Procedure: CHEST TUBE INSERTION;  Surgeon: Lorin Glass, MD;  Location: [facility] ENDOSCOPY;  Service: Pulmonary;  Laterality: Right;  w/ Talc Pleurodesis, planned admit for Obs afterwards   CHEST TUBE INSERTION Right 04/28/2022   Procedure: INSERTION PLEURAL DRAINAGE CATHETER - Pigtail tail, drainage;  Surgeon: Lorin Glass, MD;  Location: [facility] ENDOSCOPY;  Service: Pulmonary;  Laterality: Right;  talc pleurodesis   CHEST TUBE INSERTION Right 09/01/2022   Procedure: INSERTION PLEURAL DRAINAGE CATHETER;  Surgeon: Lupita Leash, MD;  Location: [facility] ENDOSCOPY;  Service: Cardiopulmonary;  Laterality: Right;  pigtail catheter placement with talc pleurodesis   COLONOSCOPY WITH PROPOFOL N/A  12/17/2018   Procedure: COLONOSCOPY WITH PROPOFOL;  Surgeon: Pasty Spillers, MD;  Location: [facility] ENDOSCOPY;  Service: Gastroenterology;  Laterality: N/A;   IR IMAGING GUIDED PORT INSERTION  04/17/2020   IR THORACENTESIS ASP PLEURAL SPACE W/IMG GUIDE  11/29/2021   IR THORACENTESIS ASP PLEURAL SPACE W/IMG GUIDE  01/03/2022   JOINT REPLACEMENT Bilateral    REPLACEMENT TOTAL KNEE BILATERAL  2015   TALC PLEURODESIS  09/01/2022   Procedure: Lurlean Nanny;  Surgeon: Lupita Leash, MD;  Location: [facility] ENDOSCOPY;  Service: Cardiopulmonary;;   TONSILLECTOMY  1960   TRANSTHORACIC ECHOCARDIOGRAM  05/2020   a) 05/2020: EF 55 to 60%.  No R WMA.  Mild LVH.  Indeterminate LVEDP.  Unable to assess RVP.  Normal aortic and mitral valves.  Mildly elevated RAP.; b) 09/2021: EF 50-55%. No RWMA. Mild LVH. ~ LVEDP. Mild LA Dilation. NORMAL RV/RAP.  Normal MV/AoV.    Family History  Problem Relation Age of Onset    Heart attack Mother    Cancer Mother    Hypertension Mother    Arthritis Father    Asthma Father    Cancer Father    COPD Father    Heart attack Father    Hypertension Sister    Cancer Sister    Diabetes Sister    Asthma Sister    Cancer Sister    COPD Sister    Arthritis Sister    Asthma Sister    Diabetes Sister    Birth defects Maternal Grandfather    Arthritis Paternal Grandmother    Diabetes Paternal Grandmother    Arthritis Paternal Grandfather    Asthma Son    Other Neg Hx        pituitary disorder    Allergies  Allergen Reactions   Bupropion Other (See Comments)    Racing heart   Hydrochlorothiazide Other (See Comments)    Cramping to lower extremities    Current Outpatient Medications on File Prior to Visit  Medication Sig Dispense Refill   albuterol (VENTOLIN HFA) 108 (90 Base) MCG/ACT inhaler Inhale 2 puffs into the lungs every 6 (six) hours as needed for wheezing or shortness of breath. 18 g 0   amLODipine (NORVASC) 10 MG tablet TAKE 1 TABLET BY MOUTH EVERY DAY FOR BLOOD PRESSURE (Patient taking differently: Take 10 mg by mouth daily.) 90 tablet 1   B-D 3CC LUER-LOK SYR 22GX1" 22G X 1" 3 ML MISC USE AS INSTRUCTED FOR TESTOSTERONE INJECTION EVERY 2 WEEKS 10 each 2   bisoprolol (ZEBETA) 5 MG tablet TAKE 1 TABLET (5 MG TOTAL) BY MOUTH DAILY. 30 tablet 0   Black Pepper-Turmeric (TURMERIC COMPLEX/BLACK PEPPER PO) Take 1 tablet by mouth in the morning and at bedtime.     blood glucose meter kit and supplies KIT Dispense based on patient and insurance preference. Use up to four times daily as directed. 1 each 0   chlorpheniramine-HYDROcodone (TUSSIONEX) 10-8 MG/5ML Take 5 mLs by mouth every 12 (twelve) hours as needed for cough. 473 mL 0   Coenzyme Q10 (COQ10) 200 MG CAPS Take 200 mg by mouth at bedtime.     cyclobenzaprine (FLEXERIL) 5 MG tablet TAKE 1 TABLET BY MOUTH AT BEDTIME AS NEEDED FOR MUSCLE SPASMS. 90 tablet 0   ELIQUIS 5 MG TABS tablet TAKE 1 TABLET BY  MOUTH TWICE A DAY (Patient taking differently: Take 5 mg by mouth 2 (two) times daily.) 180 tablet 2   esomeprazole (NEXIUM) 20 MG capsule Take 20 mg by mouth  in the morning.     gabapentin (NEURONTIN) 300 MG capsule TAKE 1 CAPSULE (300 MG TOTAL) BY MOUTH 2 (TWO) TIMES DAILY. FOR BACK PAIN. 180 capsule 0   glipiZIDE (GLUCOTROL) 5 MG tablet Take 5 mg by mouth 2 (two) times daily before a meal.     glucose blood (ONETOUCH ULTRA) test strip USE UP TO 4 TIMES DAILY AS DIRECTED 400 strip 5   Insulin Glargine (BASAGLAR KWIKPEN) 100 UNIT/ML INJECT 24 UNITS INTO THE SKIN AT BEDTIME. FOR DIABETES. (Patient taking differently: Inject 0-24 Units into the skin See admin instructions. Inject 24 units into the skin at bedtime and PER SLIDING SCALE in the morning AS NEEDED FOR AN ELEVATED BGL, possibly caused by other prescribed medications) 30 mL 1   Insulin Pen Needle (BD PEN NEEDLE NANO U/F) 32G X 4 MM MISC Use with insulin as prescribed Dx Code: E11.9 100 each 3   ipratropium-albuterol (DUONEB) 0.5-2.5 (3) MG/3ML SOLN Take 3 mLs by nebulization every 4 (four) hours while awake for 3 days, THEN 3 mLs every 4 (four) hours as needed (shortness of breath or wheezing). 360 mL 0   KRILL OIL PO Take 1 capsule by mouth daily.     Lancets MISC USE UP TO 3 TIMES DAILY AS DIRECTED 100 each 2   lidocaine (LIDODERM) 5 % Place 1 patch onto the skin daily. Remove & Discard patch within 12 hours or as directed by MD 30 patch 0   losartan (COZAAR) 50 MG tablet TAKE 1 TABLET (50 MG TOTAL) BY MOUTH DAILY. FOR BLOOD PRESSURE. 90 tablet 0   Melatonin 10 MG CAPS Take 10 mg by mouth at bedtime as needed (for sleep).     metFORMIN (GLUCOPHAGE) 1000 MG tablet TAKE 1 TABLET (1,000 MG TOTAL) BY MOUTH 2 (TWO) TIMES DAILY WITH A MEAL. FOR DIABETES 180 tablet 0   methylPREDNISolone (MEDROL DOSEPAK) 4 MG TBPK tablet Use as instructed 21 tablet 0   Multiple Vitamin (MULTI-VITAMINS) TABS Take 1 tablet by mouth daily with breakfast.      oxyCODONE (ROXICODONE) 5 MG immediate release tablet Take 1 tablet (5 mg total) by mouth every 4 (four) hours as needed for severe pain. 14 tablet 0   oxymetazoline (AFRIN) 0.05 % nasal spray Place 1 spray into both nostrils daily as needed for congestion.     pioglitazone (ACTOS) 45 MG tablet TAKE 1 TABLET (45 MG TOTAL) BY MOUTH DAILY. FOR DIABETES. 90 tablet 0   pravastatin (PRAVACHOL) 40 MG tablet TAKE 1 TABLET BY MOUTH EVERY DAY IN THE EVENING FOR CHOLESTEROL 90 tablet 0   PRESCRIPTION MEDICATION CPAP- At bedtime     testosterone cypionate (DEPOTESTOSTERONE CYPIONATE) 200 MG/ML injection Inject 200 mg into the muscle every 14 (fourteen) days.     traMADol (ULTRAM) 50 MG tablet Take 50 mg by mouth every 6 (six) hours as needed (for pain).     No current facility-administered medications on file prior to visit.    BP 102/62   Pulse (!) 106   Temp 97.6 F (36.4 C) (Temporal)   Ht 5\' 9"  (1.753 m)   Wt 285 lb 3.2 oz (129.4 kg)   SpO2 95%   BMI 42.12 kg/m  Objective:   Physical Exam Cardiovascular:     Rate and Rhythm: Normal rate and regular rhythm.  Pulmonary:     Effort: Pulmonary effort is normal.     Breath sounds: Normal breath sounds. No wheezing or rales.  Abdominal:     General:  Bowel sounds are normal.     Palpations: Abdomen is soft.     Tenderness: There is no abdominal tenderness.  Musculoskeletal:     Cervical back: Neck supple.  Skin:    General: Skin is warm and dry.  Neurological:     Mental Status: He is alert and oriented to person, place, and time.           Assessment & Plan:  Type 2 diabetes mellitus with hyperglycemia, with long-term current use of insulin (HCC) Assessment & Plan: Improved with A1C of 6.3 today.  Continue metformin 1000 mg BID, Actos 45 mg daily, Basaglar 20-26 HS. Reduce Glipizide to 5 mg BID.  Recommended daily glucose checks. Would discontinue glipizide if A1C remains the same.  Repeat A1C in 3 months. Follow up in 6  months.  Orders: -     POCT glycosylated hemoglobin (Hb A1C)  Essential hypertension Assessment & Plan: Lower than usual. Home readings are the same.   Stop bisoprolol 5 mg daily.  Continue losartan 50 mg daily, amlodipine 10 mg daily. Reviewed CMP from yesterday.   Continue to monitor.    Chronic respiratory failure with hypoxia [facility]) Assessment & Plan: Following with pulmonology and oncology.  Continue supplemental oxygen PRN, CPAP HS. Reviewed office notes from October 2023.   Small cell lung cancer, right upper lobe [facility]) Assessment & Plan: Following with oncology and pulmonology. Office notes reviewed from yesterday from [facility] through [facility], pulmonology from October 2023, oncology through [facility] from May 2024.  Continue Tarlatamab infusions every 2 weeks.  Reviewed labs from yesterday.    OSA (obstructive sleep apnea) Assessment & Plan: Continue CPAP machine HS.   Gastroesophageal reflux disease, unspecified whether esophagitis present Assessment & Plan: Controlled.  Continue Nexium 20 mg daily.    Hyperlipidemia associated with type 2 diabetes mellitus [facility]) Assessment & Plan: Reviewed lipid panel from April 2024. Continue pravastatin 40 mg daily.   Hypogonadotropic hypogonadism [facility]) Assessment & Plan: Following with urology.  Continue testosterone 200 mg every 14 days.  Reviewed testosterone level from April 2024   Chronic bilateral low back pain without sciatica Assessment & Plan: Chronic and continued. Following with [facility] oncology.  His wife will update later regarding his current mediation regimen.    History of pulmonary embolism Assessment & Plan: Continue Eliquis 5 mg BID.         Doreene Nest, NP

## 2023-07-28 NOTE — Assessment & Plan Note (Signed)
Reviewed lipid panel from April 2024. Continue pravastatin 40 mg daily.

## 2023-07-28 NOTE — Assessment & Plan Note (Signed)
Following with oncology and pulmonology. Office notes reviewed from yesterday from [facility]: Adam Wise through [facility], pulmonology from October 2023, oncology through [facility] from May 2024.  Continue Tarlatamab infusions every 2 weeks.  Reviewed labs from yesterday.

## 2023-07-28 NOTE — Assessment & Plan Note (Signed)
Continue Eliquis 5mg BID 

## 2023-07-30 ENCOUNTER — Ambulatory Visit: Payer: Medicare Other

## 2023-08-02 ENCOUNTER — Emergency Department (HOSPITAL_COMMUNITY): Payer: Medicare Other

## 2023-08-02 ENCOUNTER — Other Ambulatory Visit: Payer: Self-pay

## 2023-08-02 ENCOUNTER — Emergency Department (HOSPITAL_COMMUNITY)
Admission: EM | Admit: 2023-08-02 | Discharge: 2023-08-02 | Disposition: A | Discharge status: Home / Self Care | Payer: Medicare Other | Attending: Emergency Medicine | Admitting: Emergency Medicine

## 2023-08-02 ENCOUNTER — Encounter (HOSPITAL_COMMUNITY): Payer: Self-pay

## 2023-08-02 DIAGNOSIS — R109 Unspecified abdominal pain: Secondary | ICD-10-CM | POA: Insufficient documentation

## 2023-08-02 DIAGNOSIS — K769 Liver disease, unspecified: Secondary | ICD-10-CM | POA: Diagnosis not present

## 2023-08-02 DIAGNOSIS — D3501 Benign neoplasm of right adrenal gland: Secondary | ICD-10-CM | POA: Diagnosis not present

## 2023-08-02 DIAGNOSIS — R1011 Right upper quadrant pain: Secondary | ICD-10-CM | POA: Diagnosis not present

## 2023-08-02 DIAGNOSIS — R000 Tachycardia, unspecified: Secondary | ICD-10-CM | POA: Diagnosis not present

## 2023-08-02 DIAGNOSIS — R112 Nausea with vomiting, unspecified: Secondary | ICD-10-CM | POA: Diagnosis not present

## 2023-08-02 DIAGNOSIS — R531 Weakness: Secondary | ICD-10-CM | POA: Diagnosis not present

## 2023-08-02 DIAGNOSIS — R1012 Left upper quadrant pain: Secondary | ICD-10-CM | POA: Diagnosis not present

## 2023-08-02 DIAGNOSIS — I959 Hypotension, unspecified: Secondary | ICD-10-CM | POA: Diagnosis not present

## 2023-08-02 DIAGNOSIS — R110 Nausea: Secondary | ICD-10-CM | POA: Diagnosis not present

## 2023-08-02 DIAGNOSIS — N400 Enlarged prostate without lower urinary tract symptoms: Secondary | ICD-10-CM | POA: Diagnosis not present

## 2023-08-02 LAB — CBC WITH DIFFERENTIAL/PLATELET
Abs Immature Granulocytes: 0.02 10*3/uL (ref 0.00–0.07)
Basophils Absolute: 0 10*3/uL (ref 0.0–0.1)
Basophils Relative: 0 %
Eosinophils Absolute: 0 10*3/uL (ref 0.0–0.5)
Eosinophils Relative: 0 %
HCT: 31.3 % — ABNORMAL LOW (ref 39.0–52.0)
Hemoglobin: 9.8 g/dL — ABNORMAL LOW (ref 13.0–17.0)
Immature Granulocytes: 0 %
Lymphocytes Relative: 6 %
Lymphs Abs: 0.3 10*3/uL — ABNORMAL LOW (ref 0.7–4.0)
MCH: 30 pg (ref 26.0–34.0)
MCHC: 31.3 g/dL (ref 30.0–36.0)
MCV: 95.7 fL (ref 80.0–100.0)
Monocytes Absolute: 0.4 10*3/uL (ref 0.1–1.0)
Monocytes Relative: 8 %
Neutro Abs: 4.3 10*3/uL (ref 1.7–7.7)
Neutrophils Relative %: 86 %
Platelets: 201 10*3/uL (ref 150–400)
RBC: 3.27 MIL/uL — ABNORMAL LOW (ref 4.22–5.81)
RDW: 16.8 % — ABNORMAL HIGH (ref 11.5–15.5)
WBC: 5.1 10*3/uL (ref 4.0–10.5)
nRBC: 0 % (ref 0.0–0.2)

## 2023-08-02 LAB — COMPREHENSIVE METABOLIC PANEL
ALT: 18 U/L (ref 0–44)
AST: 26 U/L (ref 15–41)
Albumin: 3 g/dL — ABNORMAL LOW (ref 3.5–5.0)
Alkaline Phosphatase: 68 U/L (ref 38–126)
Anion gap: 10 (ref 5–15)
BUN: 12 mg/dL (ref 8–23)
CO2: 24 mmol/L (ref 22–32)
Calcium: 8 mg/dL — ABNORMAL LOW (ref 8.9–10.3)
Chloride: 101 mmol/L (ref 98–111)
Creatinine, Ser: 1.18 mg/dL (ref 0.61–1.24)
GFR, Estimated: >60 mL/min (ref >60)
Glucose, Bld: 131 mg/dL — ABNORMAL HIGH (ref 70–99)
Potassium: 3.6 mmol/L (ref 3.5–5.1)
Sodium: 135 mmol/L (ref 135–145)
Total Bilirubin: 0.5 mg/dL (ref 0.3–1.2)
Total Protein: 6 g/dL — ABNORMAL LOW (ref 6.5–8.1)

## 2023-08-02 MED ORDER — LACTATED RINGERS IV BOLUS
1000.0000 mL | Freq: Once | INTRAVENOUS | Status: AC
  Administered 2023-08-02: 1000 mL via INTRAVENOUS

## 2023-08-02 MED ORDER — DROPERIDOL 2.5 MG/ML IJ SOLN
1.2500 mg | Freq: Once | INTRAMUSCULAR | Status: AC
  Administered 2023-08-02: 1.25 mg via INTRAVENOUS
  Filled 2023-08-02: qty 2

## 2023-08-02 MED ORDER — ONDANSETRON HCL 4 MG/2ML IJ SOLN
4.0000 mg | Freq: Once | INTRAMUSCULAR | Status: AC
  Administered 2023-08-02: 4 mg via INTRAVENOUS
  Filled 2023-08-02: qty 2

## 2023-08-02 MED ORDER — IOHEXOL 300 MG/ML  SOLN
100.0000 mL | Freq: Once | INTRAMUSCULAR | Status: AC | PRN
  Administered 2023-08-02: 100 mL via INTRAVENOUS

## 2023-08-02 MED ORDER — LACTATED RINGERS IV BOLUS: 1000.0000 mL | Freq: Once | INTRAVENOUS | Status: DC

## 2023-08-02 MED ORDER — ONDANSETRON HCL 4 MG PO TABS: 4.0000 mg | ORAL_TABLET | Freq: Four times a day (QID) | ORAL | 0 refills | Status: DC

## 2023-08-02 MED ORDER — DOCUSATE SODIUM 100 MG PO CAPS: 100.0000 mg | ORAL_CAPSULE | Freq: Two times a day (BID) | ORAL | 0 refills | Status: DC

## 2023-08-02 NOTE — ED Notes (Signed)
Patient transported to CT 

## 2023-08-02 NOTE — Discharge Instructions (Addendum)
Your symptoms are likely related to the chemotherapy medications that you are taking.  You can begin taking Zofran every 8 hours to help with nausea.  Please follow-up with your oncologist to discuss his symptoms.  Sometimes they may recommend seeing a palliative care doctor, who is a doctor that specializes in managing symptoms often related to chemotherapies.  I also like you to begin taking a stool softener each day.  This can help prevent constipation while taking opiates.

## 2023-08-02 NOTE — ED Triage Notes (Signed)
Pt reports LUQ abdominal and pain under his left rib cage x1 month. Calls EMS today due to inability to tolerate PO since last night with dry heaves.  Hx of small cell lung cancer.

## 2023-08-02 NOTE — ED Provider Notes (Signed)
[facility] EMERGENCY DE[facility]MENT AT [facility] Provider Note   CSN: [id number] Arrival date & time: 08/02/23  1210     History  Chief Complaint  Patient presents with   Emesis   Abdominal Pain    Adam Wise is a 72 y.o. male.  72year-old male here today with nausea 4 days duration.  Patient with history of small cell lung cancer, currently receiving chemotherapy.  Says over the last 4 days, he has been feeling extremely nauseated.  He recently started a new monoclonal antibody that he receives at [facility].  Patient has not had any fever, no diarrhea.  He has not been taking antiemetics at home.  He says that he has not had issues with nausea while receiving chemotherapy prior to this.   Emesis Associated symptoms: abdominal pain   Abdominal Pain Associated symptoms: vomiting        Home Medications Prior to Admission medications   Medication Sig Start Date End Date Taking? Authorizing Provider  docusate sodium (COLACE) 100 MG capsule Take 1 capsule (100 mg total) by mouth every 12 (twelve) hours. 08/02/23  Yes Anders Simmonds T, DO  ondansetron (ZOFRAN) 4 MG tablet Take 1 tablet (4 mg total) by mouth every 6 (six) hours. 08/02/23  Yes Anders Simmonds T, DO  albuterol (VENTOLIN HFA) 108 (90 Base) MCG/ACT inhaler Inhale 2 puffs into the lungs every 6 (six) hours as needed for wheezing or shortness of breath. 03/14/20   Arvilla Market, MD  amLODipine (NORVASC) 10 MG tablet TAKE 1 TABLET BY MOUTH EVERY DAY FOR BLOOD PRESSURE Patient taking differently: Take 10 mg by mouth daily. 04/30/23   Doreene Nest, NP  B-D 3CC LUER-LOK SYR 22GX1" 22G X 1" 3 ML MISC USE AS INSTRUCTED FOR TESTOSTERONE INJECTION EVERY 2 WEEKS 03/20/21   Doreene Nest, NP  Black Pepper-Turmeric (TURMERIC COMPLEX/BLACK PEPPER PO) Take 1 tablet by mouth in the morning and at bedtime.    [provider]  blood glucose meter kit and supplies KIT Dispense based on patient and insurance  preference. Use up to four times daily as directed. 04/09/18   Doreene Nest, NP  Coenzyme Q10 (COQ10) 200 MG CAPS Take 200 mg by mouth at bedtime.    [provider]  cyclobenzaprine (FLEXERIL) 5 MG tablet TAKE 1 TABLET BY MOUTH AT BEDTIME AS NEEDED FOR MUSCLE SPASMS. 07/21/23   Doreene Nest, NP  ELIQUIS 5 MG TABS tablet TAKE 1 TABLET BY MOUTH TWICE A DAY Patient taking differently: Take 5 mg by mouth 2 (two) times daily. 05/03/23   Heilingoetter, Cassandra L, PA-C  esomeprazole (NEXIUM) 20 MG capsule Take 20 mg by mouth in the morning.    [provider]  gabapentin (NEURONTIN) 300 MG capsule TAKE 1 CAPSULE (300 MG TOTAL) BY MOUTH 2 (TWO) TIMES DAILY. FOR BACK PAIN. 07/12/23   Doreene Nest, NP  glipiZIDE (GLUCOTROL) 5 MG tablet Take 5 mg by mouth 2 (two) times daily before a meal.    [provider]  glucose blood (ONETOUCH ULTRA) test strip USE UP TO 4 TIMES DAILY AS DIRECTED 07/29/22   Doreene Nest, NP  Insulin Glargine (BASAGLAR KWIKPEN) 100 UNIT/ML INJECT 24 UNITS INTO THE SKIN AT BEDTIME. FOR DIABETES. Patient taking differently: Inject 0-24 Units into the skin See admin instructions. Inject 24 units into the skin at bedtime and PER SLIDING SCALE in the morning AS NEEDED FOR AN ELEVATED BGL, possibly caused by other prescribed medications 03/25/23  Doreene Nest, NP  Insulin Pen Needle (BD PEN NEEDLE NANO U/F) 32G X 4 MM MISC Use with insulin as prescribed Dx Code: E11.9 05/20/23   Doreene Nest, NP  ipratropium-albuterol (DUONEB) 0.5-2.5 (3) MG/3ML SOLN Take 3 mLs by nebulization every 4 (four) hours while awake for 3 days, THEN 3 mLs every 4 (four) hours as needed (shortness of breath or wheezing). 10/13/21 01/18/24  Narda Bonds, MD  KRILL OIL PO Take 1 capsule by mouth daily.    [provider]  Lancets MISC USE UP TO 3 TIMES DAILY AS DIRECTED 05/06/18   Doreene Nest, NP  lidocaine (LIDODERM) 5 % Place 1 patch onto the skin  daily. Remove & Discard patch within 12 hours or as directed by MD 05/19/23   Kommor, Wyn Forster, MD  losartan (COZAAR) 50 MG tablet TAKE 1 TABLET (50 MG TOTAL) BY MOUTH DAILY. FOR BLOOD PRESSURE. 05/20/23   Doreene Nest, NP  Melatonin 10 MG CAPS Take 10 mg by mouth at bedtime as needed (for sleep).    [provider]  metFORMIN (GLUCOPHAGE) 1000 MG tablet TAKE 1 TABLET (1,000 MG TOTAL) BY MOUTH 2 (TWO) TIMES DAILY WITH A MEAL. FOR DIABETES 05/20/23   Doreene Nest, NP  methylPREDNISolone (MEDROL DOSEPAK) 4 MG TBPK tablet Use as instructed 05/05/23   Si Gaul, MD  morphine (MSIR) 15 MG tablet Take 15 mg by mouth every 6 (six) hours as needed for severe pain.    [provider]  Multiple Vitamin (MULTI-VITAMINS) TABS Take 1 tablet by mouth daily with breakfast.    [provider]  oxyCODONE-acetaminophen (PERCOCET) 7.5-325 MG tablet Take 1 tablet by mouth every 8 (eight) hours as needed for severe pain.    [provider]  oxymetazoline (AFRIN) 0.05 % nasal spray Place 1 spray into both nostrils daily as needed for congestion.    [provider]  pioglitazone (ACTOS) 45 MG tablet TAKE 1 TABLET (45 MG TOTAL) BY MOUTH DAILY. FOR DIABETES. 05/20/23   Doreene Nest, NP  pravastatin (PRAVACHOL) 40 MG tablet TAKE 1 TABLET BY MOUTH EVERY DAY IN THE EVENING FOR CHOLESTEROL 05/28/23   Doreene Nest, NP  PRESCRIPTION MEDICATION CPAP- At bedtime    [provider]  testosterone cypionate (DEPOTESTOSTERONE CYPIONATE) 200 MG/ML injection Inject 200 mg into the muscle every 14 (fourteen) days.    [provider]      Allergies    Bupropion and Hydrochlorothiazide    Review of Systems   Review of Systems  Gastrointestinal:  Positive for abdominal pain and vomiting.    Physical Exam Updated Vital Signs BP 100/76   Pulse (!) 107   Temp 98.2 F (36.8 C)   Resp 18   Ht 5\' 9"  (1.753 m)   Wt 129.3 kg   SpO2 99%   BMI 42.09  kg/m  Physical Exam Vitals reviewed.  HENT:     Head: Normocephalic.  Cardiovascular:     Rate and Rhythm: Tachycardia present.  Abdominal:     General: There is no distension.     Palpations: Abdomen is soft. There is no pulsatile mass.     ED Results / Procedures / Treatments   Labs (all labs ordered are listed, but only abnormal results are displayed) Labs Reviewed  COMPREHENSIVE METABOLIC PANEL - Abnormal; Notable for the following components:      Result Value   Glucose, Bld 131 (*)    Calcium 8.0 (*)  Total Protein 6.0 (*)    Albumin 3.0 (*)    All other components within normal limits  CBC WITH DIFFERENTIAL/PLATELET - Abnormal; Notable for the following components:   RBC 3.27 (*)    Hemoglobin 9.8 (*)    HCT 31.3 (*)    RDW 16.8 (*)    Lymphs Abs 0.3 (*)    All other components within normal limits    EKG EKG Interpretation Date/Time:  Sunday August 02 2023 12:23:02 EDT Ventricular Rate:  108 PR Interval:  206 QRS Duration:  94 QT Interval:  335 QTC Calculation: 449 R Axis:   48  Text Interpretation: Sinus tachycardia Low voltage, extremity and precordial leads Confirmed by Alona Bene [phone]) on 08/02/2023 1:26:59 PM  Radiology CT ABDOMEN PELVIS W CONTRAST  Result Date: 08/02/2023 CLINICAL DATA:  History of lung cancer. Patient presents with acute left upper quadrant pain and pain under left rib cage. * Tracking Code: BO *. EXAM: CT ABDOMEN AND PELVIS WITH CONTRAST TECHNIQUE: Multidetector CT imaging of the abdomen and pelvis was performed using the standard protocol following bolus administration of intravenous contrast. RADIATION DOSE REDUCTION: This exam was performed according to the departmental dose-optimization program which includes automated exposure control, adjustment of the mA and/or kV according to patient size and/or use of iterative reconstruction technique. CONTRAST:  OMNIPAQUE IOHEXOL 300 MG/ML  SOLN COMPARISON:  05/01/2023 FINDINGS:  Lower chest: High attenuation pleural thickening and small volume loculated fluid overlies the right lung base. Fibrosis and perihilar masslike architectural distortion is noted within the right lung compatible with changes due to external beam radiation. No signs of acute airspace consolidation. Hepatobiliary: Hypodense lesion within lateral dome of the right lobe of liver measures 5.2 x 4.3 cm by 3.0 cm (volume = 35 cm^3), image 19/4. On the previous exam this lesion measured 4.6 by 2.6 by 2.6 cm (volume = 16 cm^3) by my measurements. Hypodense lesion involving segments 5 and 4B measures 1.9 x 1.9 cm, image 33/4. On the previous exam this measured 3.3 x 2.3 cm. Small low-density lesion within the left hepatic lobe measures 7 mm, image 7, image 23/4. Previously 6 mm. Index lesion within caudate lobe measures 2.2 x 1.3 cm, image 29/4. Previously 2.0 x 1.3 cm. Gallbladder appears normal without bile duct dilatation. Pancreas: Unremarkable. No pancreatic ductal dilatation or surrounding inflammatory changes. Spleen: Normal in size without focal abnormality. Adrenals/Urinary Tract: Normal left adrenal gland. Previously characterized benign right adrenal adenoma measures 1.8 cm and appears similar to previous exam. No follow-up imaging recommended. Multiple simple appearing right kidney cysts. The largest measures 6.8 cm. No follow-up imaging recommended. Kidneys are otherwise unremarkable without signs of nephrolithiasis or obstructive uropathy. Bladder normal. Stomach/Bowel: Stomach is within normal limits. No pathologic dilatation of the large or the small bowel loops to suggest a bowel obstruction. No bowel wall thickening or inflammation. Vascular/Lymphatic: Aortic atherosclerosis. Enlarged portacaval lymph node measures 3.3 by 1.9 cm, image 38/4. Formally this measured 2.4 x 1.8 cm. No pelvic or inguinal adenopathy. Reproductive: Prostate gland enlargement. Other: No ascites or focal fluid collections. New soft  tissue stranding is identified around the umbilicus within the subcutaneous fat, image 77/4. Musculoskeletal: No acute or suspicious osseous findings. Thoracolumbar degenerative disc disease identified. IMPRESSION: 1. No acute findings within the abdomen or pelvis. 2. Interval increase in size of hypodense lesion within the lateral dome of the right lobe of liver. Other hypodense lesions within the liver are essentially unchanged to decreased in size. 3. Interval  increase in size of portacaval adenopathy. 4. New soft tissue stranding is identified around the umbilicus within the subcutaneous fat. This is nonspecific and may represent sequelae of recent trauma or cellulitis. 5. Stable benign right adrenal adenoma. No follow-up imaging recommended. 6. Prostate gland enlargement. 7. High attenuation pleural thickening and small volume loculated fluid overlies the right lung base, which may reflect prior pleurodesis. Fibrosis and perihilar masslike architectural distortion is noted within the right lung compatible with changes due to external beam radiation. 8.  Aortic Atherosclerosis (ICD10-I70.0). Electronically Signed   By: Signa Kell M.D.   On: 08/02/2023 14:53    Procedures Procedures    Medications Ordered in ED Medications  ondansetron (ZOFRAN) injection 4 mg (4 mg Intravenous Given 08/02/23 1316)  lactated ringers bolus 1,000 mL (0 mLs Intravenous Stopped 08/02/23 1533)  lactated ringers bolus 1,000 mL (0 mLs Intravenous Stopped 08/02/23 1533)  iohexol (OMNIPAQUE) 300 MG/ML solution 100 mL (100 mLs Intravenous Contrast Given 08/02/23 1409)  droperidol (INAPSINE) 2.5 MG/ML injection 1.25 mg (1.25 mg Intravenous Given 08/02/23 1554)    ED Course/ Medical Decision Making/ A&P                                 Medical Decision Making 72 year old male with small cell lung cancer here today with nausea and vomiting.  Differential diagnoses include medication reaction, less likely obstruction,  enteritis, gastritis, less likely ACS.  Plan- with the patient currently receiving chemotherapy, this is likely the culprit of his nausea.  Surprisingly, patient has not had issues with this in the past, but he has recently started a new agent.  Patient tachycardic here today, believe this is due to dehydration.  Will provide the patient with IV fluids, antiemetics.  Labs ordered, CT imaging of the abdomen pelvis ordered.  EKG ordered.  Reassessment-patient beginning to feel bit better after fluids, Zofran.  I reviewed the patient's CT imaging, no acute findings.  Liver lesion slightly increased in size.  Discussed with patient at bedside.  Will try some droperidol to see if he can get a bit more improvement with his nausea.  Please let his nausea is related to his chemotherapy medications.  Will plan to discharge patient with Zofran prescription.  Amount and/or Complexity of Data Reviewed Labs: ordered. Radiology: ordered.  Risk OTC drugs. Prescription drug management.           Final Clinical Impression(s) / ED Diagnoses Final diagnoses:  Nausea and vomiting, unspecified vomiting type    Rx / DC Orders ED Discharge Orders          Ordered    ondansetron (ZOFRAN) 4 MG tablet  Every 6 hours        08/02/23 1530    docusate sodium (COLACE) 100 MG capsule  Every 12 hours        08/02/23 1530              Anders Simmonds T, DO 08/02/23 1631

## 2023-08-03 ENCOUNTER — Ambulatory Visit: Payer: Medicare Other

## 2023-08-05 DIAGNOSIS — K769 Liver disease, unspecified: Secondary | ICD-10-CM | POA: Diagnosis not present

## 2023-08-05 DIAGNOSIS — R599 Enlarged lymph nodes, unspecified: Secondary | ICD-10-CM | POA: Diagnosis not present

## 2023-08-05 DIAGNOSIS — C3411 Malignant neoplasm of upper lobe, right bronchus or lung: Secondary | ICD-10-CM | POA: Diagnosis not present

## 2023-08-06 ENCOUNTER — Ambulatory Visit: Payer: Medicare Other

## 2023-08-10 ENCOUNTER — Telehealth: Payer: Self-pay

## 2023-08-10 ENCOUNTER — Emergency Department (HOSPITAL_COMMUNITY): Payer: Medicare Other

## 2023-08-10 ENCOUNTER — Ambulatory Visit: Payer: Medicare Other

## 2023-08-10 ENCOUNTER — Other Ambulatory Visit: Payer: Self-pay

## 2023-08-10 ENCOUNTER — Inpatient Hospital Stay (HOSPITAL_COMMUNITY)
Admission: EM | Admit: 2023-08-10 | Discharge: 2023-08-15 | DRG: 682 | Disposition: A | Discharge status: Home Health | Payer: Medicare Other | Attending: Family Medicine | Admitting: Family Medicine

## 2023-08-10 DIAGNOSIS — Z79899 Other long term (current) drug therapy: Secondary | ICD-10-CM

## 2023-08-10 DIAGNOSIS — Z794 Long term (current) use of insulin: Secondary | ICD-10-CM | POA: Diagnosis not present

## 2023-08-10 DIAGNOSIS — N179 Acute kidney failure, unspecified: Secondary | ICD-10-CM | POA: Diagnosis not present

## 2023-08-10 DIAGNOSIS — M5031 Other cervical disc degeneration,  high cervical region: Secondary | ICD-10-CM | POA: Diagnosis present

## 2023-08-10 DIAGNOSIS — C787 Secondary malignant neoplasm of liver and intrahepatic bile duct: Secondary | ICD-10-CM | POA: Diagnosis present

## 2023-08-10 DIAGNOSIS — J9611 Chronic respiratory failure with hypoxia: Secondary | ICD-10-CM | POA: Diagnosis present

## 2023-08-10 DIAGNOSIS — R630 Anorexia: Secondary | ICD-10-CM | POA: Diagnosis present

## 2023-08-10 DIAGNOSIS — E785 Hyperlipidemia, unspecified: Secondary | ICD-10-CM | POA: Diagnosis present

## 2023-08-10 DIAGNOSIS — J449 Chronic obstructive pulmonary disease, unspecified: Secondary | ICD-10-CM | POA: Diagnosis present

## 2023-08-10 DIAGNOSIS — T481X5A Adverse effect of skeletal muscle relaxants [neuromuscular blocking agents], initial encounter: Secondary | ICD-10-CM | POA: Diagnosis not present

## 2023-08-10 DIAGNOSIS — Z888 Allergy status to other drugs, medicaments and biological substances status: Secondary | ICD-10-CM

## 2023-08-10 DIAGNOSIS — Z825 Family history of asthma and other chronic lower respiratory diseases: Secondary | ICD-10-CM

## 2023-08-10 DIAGNOSIS — E119 Type 2 diabetes mellitus without complications: Secondary | ICD-10-CM

## 2023-08-10 DIAGNOSIS — M545 Low back pain, unspecified: Secondary | ICD-10-CM | POA: Diagnosis not present

## 2023-08-10 DIAGNOSIS — R0902 Hypoxemia: Principal | ICD-10-CM

## 2023-08-10 DIAGNOSIS — M5134 Other intervertebral disc degeneration, thoracic region: Secondary | ICD-10-CM | POA: Diagnosis not present

## 2023-08-10 DIAGNOSIS — Z85118 Personal history of other malignant neoplasm of bronchus and lung: Secondary | ICD-10-CM | POA: Diagnosis not present

## 2023-08-10 DIAGNOSIS — I2510 Atherosclerotic heart disease of native coronary artery without angina pectoris: Secondary | ICD-10-CM | POA: Diagnosis present

## 2023-08-10 DIAGNOSIS — Z515 Encounter for palliative care: Secondary | ICD-10-CM | POA: Diagnosis not present

## 2023-08-10 DIAGNOSIS — C3411 Malignant neoplasm of upper lobe, right bronchus or lung: Secondary | ICD-10-CM | POA: Diagnosis not present

## 2023-08-10 DIAGNOSIS — Z8489 Family history of other specified conditions: Secondary | ICD-10-CM

## 2023-08-10 DIAGNOSIS — Z809 Family history of malignant neoplasm, unspecified: Secondary | ICD-10-CM

## 2023-08-10 DIAGNOSIS — Z86711 Personal history of pulmonary embolism: Secondary | ICD-10-CM | POA: Diagnosis present

## 2023-08-10 DIAGNOSIS — R231 Pallor: Secondary | ICD-10-CM | POA: Diagnosis not present

## 2023-08-10 DIAGNOSIS — Z6841 Body Mass Index (BMI) 40.0 and over, adult: Secondary | ICD-10-CM

## 2023-08-10 DIAGNOSIS — E860 Dehydration: Secondary | ICD-10-CM | POA: Diagnosis present

## 2023-08-10 DIAGNOSIS — G893 Neoplasm related pain (acute) (chronic): Secondary | ICD-10-CM | POA: Diagnosis not present

## 2023-08-10 DIAGNOSIS — R609 Edema, unspecified: Secondary | ICD-10-CM | POA: Diagnosis not present

## 2023-08-10 DIAGNOSIS — I959 Hypotension, unspecified: Secondary | ICD-10-CM | POA: Diagnosis present

## 2023-08-10 DIAGNOSIS — Z833 Family history of diabetes mellitus: Secondary | ICD-10-CM

## 2023-08-10 DIAGNOSIS — Z87891 Personal history of nicotine dependence: Secondary | ICD-10-CM

## 2023-08-10 DIAGNOSIS — E876 Hypokalemia: Secondary | ICD-10-CM | POA: Diagnosis not present

## 2023-08-10 DIAGNOSIS — E871 Hypo-osmolality and hyponatremia: Secondary | ICD-10-CM | POA: Diagnosis present

## 2023-08-10 DIAGNOSIS — M4802 Spinal stenosis, cervical region: Secondary | ICD-10-CM | POA: Diagnosis present

## 2023-08-10 DIAGNOSIS — Z8261 Family history of arthritis: Secondary | ICD-10-CM

## 2023-08-10 DIAGNOSIS — Z9221 Personal history of antineoplastic chemotherapy: Secondary | ICD-10-CM

## 2023-08-10 DIAGNOSIS — R5382 Chronic fatigue, unspecified: Secondary | ICD-10-CM | POA: Diagnosis present

## 2023-08-10 DIAGNOSIS — M47813 Spondylosis without myelopathy or radiculopathy, cervicothoracic region: Secondary | ICD-10-CM | POA: Diagnosis not present

## 2023-08-10 DIAGNOSIS — G4733 Obstructive sleep apnea (adult) (pediatric): Secondary | ICD-10-CM | POA: Diagnosis not present

## 2023-08-10 DIAGNOSIS — G894 Chronic pain syndrome: Secondary | ICD-10-CM | POA: Diagnosis present

## 2023-08-10 DIAGNOSIS — I10 Essential (primary) hypertension: Secondary | ICD-10-CM | POA: Diagnosis present

## 2023-08-10 DIAGNOSIS — G928 Other toxic encephalopathy: Secondary | ICD-10-CM | POA: Diagnosis not present

## 2023-08-10 DIAGNOSIS — I509 Heart failure, unspecified: Secondary | ICD-10-CM | POA: Diagnosis not present

## 2023-08-10 DIAGNOSIS — R531 Weakness: Secondary | ICD-10-CM

## 2023-08-10 DIAGNOSIS — J9601 Acute respiratory failure with hypoxia: Secondary | ICD-10-CM | POA: Diagnosis present

## 2023-08-10 DIAGNOSIS — Z8249 Family history of ischemic heart disease and other diseases of the circulatory system: Secondary | ICD-10-CM

## 2023-08-10 DIAGNOSIS — I517 Cardiomegaly: Secondary | ICD-10-CM | POA: Diagnosis not present

## 2023-08-10 DIAGNOSIS — K219 Gastro-esophageal reflux disease without esophagitis: Secondary | ICD-10-CM | POA: Diagnosis present

## 2023-08-10 DIAGNOSIS — M47812 Spondylosis without myelopathy or radiculopathy, cervical region: Secondary | ICD-10-CM | POA: Diagnosis not present

## 2023-08-10 DIAGNOSIS — M5124 Other intervertebral disc displacement, thoracic region: Secondary | ICD-10-CM | POA: Diagnosis not present

## 2023-08-10 DIAGNOSIS — Z7901 Long term (current) use of anticoagulants: Secondary | ICD-10-CM

## 2023-08-10 DIAGNOSIS — M549 Dorsalgia, unspecified: Secondary | ICD-10-CM | POA: Diagnosis not present

## 2023-08-10 DIAGNOSIS — R000 Tachycardia, unspecified: Secondary | ICD-10-CM | POA: Diagnosis not present

## 2023-08-10 DIAGNOSIS — K2970 Gastritis, unspecified, without bleeding: Secondary | ICD-10-CM | POA: Diagnosis present

## 2023-08-10 DIAGNOSIS — R918 Other nonspecific abnormal finding of lung field: Secondary | ICD-10-CM | POA: Diagnosis not present

## 2023-08-10 DIAGNOSIS — E1169 Type 2 diabetes mellitus with other specified complication: Secondary | ICD-10-CM | POA: Diagnosis present

## 2023-08-10 DIAGNOSIS — Z7984 Long term (current) use of oral hypoglycemic drugs: Secondary | ICD-10-CM

## 2023-08-10 DIAGNOSIS — Z96653 Presence of artificial knee joint, bilateral: Secondary | ICD-10-CM | POA: Diagnosis present

## 2023-08-10 DIAGNOSIS — Z7189 Other specified counseling: Secondary | ICD-10-CM | POA: Diagnosis not present

## 2023-08-10 DIAGNOSIS — C3490 Malignant neoplasm of unspecified part of unspecified bronchus or lung: Secondary | ICD-10-CM | POA: Diagnosis not present

## 2023-08-10 DIAGNOSIS — E669 Obesity, unspecified: Secondary | ICD-10-CM | POA: Diagnosis present

## 2023-08-10 LAB — COMPREHENSIVE METABOLIC PANEL
ALT: 25 U/L (ref 0–44)
AST: 64 U/L — ABNORMAL HIGH (ref 15–41)
Albumin: 2.8 g/dL — ABNORMAL LOW (ref 3.5–5.0)
Alkaline Phosphatase: 63 U/L (ref 38–126)
Anion gap: 12 (ref 5–15)
BUN: 20 mg/dL (ref 8–23)
CO2: 25 mmol/L (ref 22–32)
Calcium: 7.7 mg/dL — ABNORMAL LOW (ref 8.9–10.3)
Chloride: 94 mmol/L — ABNORMAL LOW (ref 98–111)
Creatinine, Ser: 1.4 mg/dL — ABNORMAL HIGH (ref 0.61–1.24)
GFR, Estimated: 53 mL/min — ABNORMAL LOW (ref >60)
Glucose, Bld: 177 mg/dL — ABNORMAL HIGH (ref 70–99)
Potassium: 4.1 mmol/L (ref 3.5–5.1)
Sodium: 131 mmol/L — ABNORMAL LOW (ref 135–145)
Total Bilirubin: 0.6 mg/dL (ref 0.3–1.2)
Total Protein: 6.1 g/dL — ABNORMAL LOW (ref 6.5–8.1)

## 2023-08-10 LAB — CBC WITH DIFFERENTIAL/PLATELET
Abs Immature Granulocytes: 0.02 10*3/uL (ref 0.00–0.07)
Basophils Absolute: 0 10*3/uL (ref 0.0–0.1)
Basophils Relative: 0 %
Eosinophils Absolute: 0 10*3/uL (ref 0.0–0.5)
Eosinophils Relative: 0 %
HCT: 31.3 % — ABNORMAL LOW (ref 39.0–52.0)
Hemoglobin: 9.6 g/dL — ABNORMAL LOW (ref 13.0–17.0)
Immature Granulocytes: 0 %
Lymphocytes Relative: 4 %
Lymphs Abs: 0.3 10*3/uL — ABNORMAL LOW (ref 0.7–4.0)
MCH: 29.7 pg (ref 26.0–34.0)
MCHC: 30.7 g/dL (ref 30.0–36.0)
MCV: 96.9 fL (ref 80.0–100.0)
Monocytes Absolute: 0.8 10*3/uL (ref 0.1–1.0)
Monocytes Relative: 10 %
Neutro Abs: 6.5 10*3/uL (ref 1.7–7.7)
Neutrophils Relative %: 86 %
Platelets: 178 10*3/uL (ref 150–400)
RBC: 3.23 MIL/uL — ABNORMAL LOW (ref 4.22–5.81)
RDW: 16.4 % — ABNORMAL HIGH (ref 11.5–15.5)
WBC: 7.6 10*3/uL (ref 4.0–10.5)
nRBC: 0 % (ref 0.0–0.2)

## 2023-08-10 LAB — TROPONIN I (HIGH SENSITIVITY)
Troponin I (High Sensitivity): 20 ng/L — ABNORMAL HIGH (ref <18)
Troponin I (High Sensitivity): 22 ng/L — ABNORMAL HIGH (ref <18)

## 2023-08-10 LAB — CBG MONITORING, ED: Glucose-Capillary: 202 mg/dL — ABNORMAL HIGH (ref 70–99)

## 2023-08-10 LAB — LIPASE, BLOOD: Lipase: 20 U/L (ref 11–51)

## 2023-08-10 LAB — MAGNESIUM: Magnesium: 1 mg/dL — ABNORMAL LOW (ref 1.7–2.4)

## 2023-08-10 MED ORDER — ACETAMINOPHEN 650 MG RE SUPP: 650.0000 mg | Freq: Four times a day (QID) | RECTAL | Status: DC | PRN

## 2023-08-10 MED ORDER — SODIUM CHLORIDE 0.9 % IV BOLUS
1000.0000 mL | Freq: Once | INTRAVENOUS | Status: AC
  Administered 2023-08-10: 1000 mL via INTRAVENOUS

## 2023-08-10 MED ORDER — APIXABAN 5 MG PO TABS
5.0000 mg | ORAL_TABLET | Freq: Two times a day (BID) | ORAL | Status: DC
  Administered 2023-08-10 – 2023-08-15 (×10): 5 mg via ORAL
  Filled 2023-08-10 (×10): qty 1

## 2023-08-10 MED ORDER — GABAPENTIN 300 MG PO CAPS
300.0000 mg | ORAL_CAPSULE | Freq: Two times a day (BID) | ORAL | Status: DC
  Administered 2023-08-10 – 2023-08-11 (×2): 300 mg via ORAL
  Filled 2023-08-10 (×2): qty 1

## 2023-08-10 MED ORDER — MELATONIN 5 MG PO TABS: 10.0000 mg | ORAL_TABLET | Freq: Every evening | ORAL | Status: DC | PRN

## 2023-08-10 MED ORDER — MORPHINE SULFATE 15 MG PO TABS
15.0000 mg | ORAL_TABLET | Freq: Four times a day (QID) | ORAL | Status: DC | PRN
  Administered 2023-08-11: 15 mg via ORAL
  Filled 2023-08-10: qty 1

## 2023-08-10 MED ORDER — PRAVASTATIN SODIUM 20 MG PO TABS
40.0000 mg | ORAL_TABLET | Freq: Every day | ORAL | Status: DC
  Administered 2023-08-11 – 2023-08-14 (×4): 40 mg via ORAL
  Filled 2023-08-10 (×4): qty 2

## 2023-08-10 MED ORDER — SODIUM CHLORIDE 0.9 % IV SOLN: INTRAVENOUS | Status: DC

## 2023-08-10 MED ORDER — INSULIN ASPART 100 UNIT/ML IJ SOLN
0.0000 [IU] | Freq: Every day | INTRAMUSCULAR | Status: DC
  Administered 2023-08-10: 2 [IU] via SUBCUTANEOUS
  Filled 2023-08-10: qty 0.05

## 2023-08-10 MED ORDER — ACETAMINOPHEN 325 MG PO TABS
650.0000 mg | ORAL_TABLET | Freq: Four times a day (QID) | ORAL | Status: DC | PRN
  Administered 2023-08-14: 650 mg via ORAL
  Filled 2023-08-10: qty 2

## 2023-08-10 MED ORDER — INSULIN ASPART 100 UNIT/ML IJ SOLN
0.0000 [IU] | Freq: Three times a day (TID) | INTRAMUSCULAR | Status: DC
  Administered 2023-08-11 (×2): 2 [IU] via SUBCUTANEOUS
  Administered 2023-08-11 – 2023-08-12 (×2): 3 [IU] via SUBCUTANEOUS
  Administered 2023-08-12 – 2023-08-13 (×2): 2 [IU] via SUBCUTANEOUS
  Administered 2023-08-15: 8 [IU] via SUBCUTANEOUS
  Filled 2023-08-10: qty 0.15

## 2023-08-10 MED ORDER — LACTATED RINGERS IV BOLUS
1000.0000 mL | Freq: Once | INTRAVENOUS | Status: AC
  Administered 2023-08-10: 1000 mL via INTRAVENOUS

## 2023-08-10 MED ORDER — MORPHINE SULFATE 15 MG PO TABS
15.0000 mg | ORAL_TABLET | Freq: Once | ORAL | Status: AC
  Administered 2023-08-10: 15 mg via ORAL
  Filled 2023-08-10: qty 1

## 2023-08-10 MED ORDER — MAGNESIUM SULFATE 2 GM/50ML IV SOLN
2.0000 g | Freq: Once | INTRAVENOUS | Status: AC
  Administered 2023-08-10: 2 g via INTRAVENOUS
  Filled 2023-08-10: qty 50

## 2023-08-10 MED ORDER — PANTOPRAZOLE SODIUM 40 MG PO TBEC
40.0000 mg | DELAYED_RELEASE_TABLET | Freq: Every day | ORAL | Status: DC
  Administered 2023-08-10 – 2023-08-15 (×6): 40 mg via ORAL
  Filled 2023-08-10 (×6): qty 1

## 2023-08-10 MED ORDER — SENNOSIDES-DOCUSATE SODIUM 8.6-50 MG PO TABS: 1.0000 | ORAL_TABLET | Freq: Every evening | ORAL | Status: DC | PRN

## 2023-08-10 NOTE — Telephone Encounter (Signed)
Transition Care Management Unsuccessful Follow-up Telephone Call  Date of discharge and from where:   [facility] 8/18  Attempts:  1st Attempt  Reason for unsuccessful TCM follow-up call:  No answer/busy   Lenard Forth [place]  [facility], [facility] Guide, Phone: [phone] Website: [email or web address]

## 2023-08-10 NOTE — ED Notes (Signed)
Patient aware that we need urine sample for testing, unable at this time. Pt given instruction on providing urine sample when able to do so.   

## 2023-08-10 NOTE — H&P (Incomplete)
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  Chronic back pain, COPD, HTN, HLD, OSA on CPAP, nocturnal oxygen, T2DM.  Patient brought to the ER because of progressive weakness.  Per patient she has not eaten or drunk anything in the past 5 days.  He states his taste but has blown up.  He denies nausea, vomiting or diarrhea.  He denies constipation.  His last chemotherapy was 2 weeks ago.  He states his worsening lower back pain which is an issue.  Today he was trying to get into his truck, could not and slid to the ground.  In the ER, intake vitals 107/69, 121, 19, afebrile, 88% on room air.  Tmax 99.1.  Creatinine 1.4, baseline normal.  Magnesium 1.0.  Admission requested for AKI and weakness  Review of Systems:  Per HPI  Past Medical History: Past Medical History:  Diagnosis Date   Acute on chronic respiratory failure with hypoxia (HCC) 10/12/2021   Chickenpox    Chronic knee pain    Chronic low back pain    COPD exacerbation (HCC) 10/11/2021   COPD with exacerbation (HCC) 10/12/2021   Coronary artery calcification seen on CAT scan 11/2021   Coronary CTA 11/27/2021: Moderate to large right pleural effusion and compressive atelectasis right lung base. => Coronary Calcium Score 657.  Diffuse RCA calcification.  Minimal mild disease in the LAD and diagonal branches. == Overall limited study.  Notable artifact.   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 He has  disease progression after the first line of chemotherapy in December 2021.   Testosterone deficiency    Type 2 diabetes mellitus (HCC)    Past Surgical History:  Procedure Laterality Date   CHEST TUBE INSERTION Right 01/20/2022   Procedure: CHEST TUBE INSERTION;  Surgeon: Lorin Glass, MD;  Location: [facility] ENDOSCOPY;  Service: Pulmonary;  Laterality: Right;  w/ Talc Pleurodesis, planned admit for Obs afterwards   CHEST TUBE INSERTION Right 04/28/2022   Procedure: INSERTION PLEURAL DRAINAGE CATHETER - Pigtail tail, drainage;  Surgeon: Lorin Glass, MD;  Location: [facility] ENDOSCOPY;  Service: Pulmonary;  Laterality: Right;  talc pleurodesis   CHEST TUBE INSERTION Right 09/01/2022   Procedure: INSERTION PLEURAL DRAINAGE CATHETER;  Surgeon: Lupita Leash, MD;  Location: [facility] ENDOSCOPY;  Service: Cardiopulmonary;  Laterality: Right;  pigtail catheter placement with talc pleurodesis   COLONOSCOPY WITH PROPOFOL N/A 12/17/2018   Procedure: COLONOSCOPY WITH PROPOFOL;  Surgeon: Pasty Spillers, MD;  Location: [facility] ENDOSCOPY;  Service: Gastroenterology;  Laterality: N/A;   IR IMAGING GUIDED PORT INSERTION  04/17/2020   IR THORACENTESIS ASP PLEURAL SPACE W/IMG GUIDE  11/29/2021   IR THORACENTESIS ASP PLEURAL SPACE W/IMG GUIDE  01/03/2022   JOINT REPLACEMENT Bilateral    REPLACEMENT TOTAL KNEE BILATERAL  2015   TALC PLEURODESIS  09/01/2022   Procedure: Lurlean Nanny;  Surgeon: Lupita Leash, MD;  Location: [facility] ENDOSCOPY;  Service: Cardiopulmonary;;   TONSILLECTOMY  1960  TRANSTHORACIC ECHOCARDIOGRAM  05/2020   a) 05/2020: EF 55 to 60%.  No R WMA.  Mild LVH.  Indeterminate LVEDP.  Unable to assess RVP.  Normal aortic and mitral valves.  Mildly elevated RAP.; b) 09/2021: EF 50-55%. No RWMA. Mild LVH. ~ LVEDP. Mild LA Dilation. NORMAL RV/RAP.  Normal MV/AoV.    Medications: Prior to Admission medications   Medication Sig Start Date End Date Taking? Authorizing Provider  ELIQUIS 5 MG TABS tablet  TAKE 1 TABLET BY MOUTH TWICE A DAY Patient taking differently: Take 5 mg by mouth 2 (two) times daily. 05/03/23  Yes Heilingoetter, Cassandra L, PA-C  ipratropium-albuterol (DUONEB) 0.5-2.5 (3) MG/3ML SOLN Take 3 mLs by nebulization every 4 (four) hours while awake for 3 days, THEN 3 mLs every 4 (four) hours as needed (shortness of breath or wheezing). 10/13/21 01/18/24 Yes Narda Bonds, MD  morphine (MSIR) 15 MG tablet Take 15 mg by mouth every 6 (six) hours as needed for severe pain.   Yes [provider]  oxyCODONE-acetaminophen (PERCOCET) 7.5-325 MG tablet Take 1 tablet by mouth every 8 (eight) hours as needed for severe pain.   Yes [provider]  testosterone cypionate (DEPOTESTOSTERONE CYPIONATE) 200 MG/ML injection Inject 200 mg into the muscle every 14 (fourteen) days.   Yes [provider]  albuterol (VENTOLIN HFA) 108 (90 Base) MCG/ACT inhaler Inhale 2 puffs into the lungs every 6 (six) hours as needed for wheezing or shortness of breath. Patient not taking: Reported on 08/10/2023 03/14/20   Arvilla Market, MD  amLODipine (NORVASC) 10 MG tablet TAKE 1 TABLET BY MOUTH EVERY DAY FOR BLOOD PRESSURE Patient taking differently: Take 10 mg by mouth daily. 04/30/23   Doreene Nest, NP  B-D 3CC LUER-LOK SYR 22GX1" 22G X 1" 3 ML MISC USE AS INSTRUCTED FOR TESTOSTERONE INJECTION EVERY 2 WEEKS 03/20/21   Doreene Nest, NP  Black Pepper-Turmeric (TURMERIC COMPLEX/BLACK PEPPER PO) Take 1 tablet by mouth in the morning and at bedtime.    [provider]  blood glucose meter kit and supplies KIT Dispense based on patient and insurance preference. Use up to four times daily as directed. 04/09/18   Doreene Nest, NP  Coenzyme Q10 (COQ10) 200 MG CAPS Take 200 mg by mouth at bedtime.    [provider]  cyclobenzaprine (FLEXERIL) 5 MG tablet TAKE 1 TABLET BY MOUTH AT BEDTIME AS NEEDED FOR MUSCLE SPASMS. 07/21/23   Doreene Nest, NP  docusate  sodium (COLACE) 100 MG capsule Take 1 capsule (100 mg total) by mouth every 12 (twelve) hours. 08/02/23   Arletha Pili, DO  esomeprazole (NEXIUM) 20 MG capsule Take 20 mg by mouth in the morning.    [provider]  gabapentin (NEURONTIN) 300 MG capsule TAKE 1 CAPSULE (300 MG TOTAL) BY MOUTH 2 (TWO) TIMES DAILY. FOR BACK PAIN. 07/12/23   Doreene Nest, NP  glipiZIDE (GLUCOTROL) 5 MG tablet Take 5 mg by mouth 2 (two) times daily before a meal.    [provider]  glucose blood (ONETOUCH ULTRA) test strip USE UP TO 4 TIMES DAILY AS DIRECTED 07/29/22   Doreene Nest, NP  Insulin Glargine (BASAGLAR KWIKPEN) 100 UNIT/ML INJECT 24 UNITS INTO THE SKIN AT BEDTIME. FOR DIABETES. Patient taking differently: Inject 0-24 Units into the skin See admin instructions. Inject 24 units into the skin at bedtime and PER SLIDING SCALE in the morning AS NEEDED FOR AN ELEVATED BGL, possibly caused by other  prescribed medications 03/25/23   Doreene Nest, NP  Insulin Pen Needle (BD PEN NEEDLE NANO U/F) 32G X 4 MM MISC Use with insulin as prescribed Dx Code: E11.9 05/20/23   Doreene Nest, NP  KRILL OIL PO Take 1 capsule by mouth daily.    [provider]  Lancets MISC USE UP TO 3 TIMES DAILY AS DIRECTED 05/06/18   Doreene Nest, NP  lidocaine (LIDODERM) 5 % Place 1 patch onto the skin daily. Remove & Discard patch within 12 hours or as directed by MD 05/19/23   Kommor, Wyn Forster, MD  losartan (COZAAR) 50 MG tablet TAKE 1 TABLET (50 MG TOTAL) BY MOUTH DAILY. FOR BLOOD PRESSURE. 05/20/23   Doreene Nest, NP  Melatonin 10 MG CAPS Take 10 mg by mouth at bedtime as needed (for sleep).    [provider]  metFORMIN (GLUCOPHAGE) 1000 MG tablet TAKE 1 TABLET (1,000 MG TOTAL) BY MOUTH 2 (TWO) TIMES DAILY WITH A MEAL. FOR DIABETES 05/20/23   Doreene Nest, NP  methylPREDNISolone (MEDROL DOSEPAK) 4 MG TBPK tablet Use as instructed 05/05/23   Si Gaul, MD  Multiple  Vitamin (MULTI-VITAMINS) TABS Take 1 tablet by mouth daily with breakfast.    [provider]  ondansetron (ZOFRAN) 4 MG tablet Take 1 tablet (4 mg total) by mouth every 6 (six) hours. 08/02/23   Arletha Pili, DO  oxymetazoline (AFRIN) 0.05 % nasal spray Place 1 spray into both nostrils daily as needed for congestion.    [provider]  pioglitazone (ACTOS) 45 MG tablet TAKE 1 TABLET (45 MG TOTAL) BY MOUTH DAILY. FOR DIABETES. 05/20/23   Doreene Nest, NP  pravastatin (PRAVACHOL) 40 MG tablet TAKE 1 TABLET BY MOUTH EVERY DAY IN THE EVENING FOR CHOLESTEROL 05/28/23   Doreene Nest, NP  PRESCRIPTION MEDICATION CPAP- At bedtime    [provider]    Allergies:   Allergies  Allergen Reactions   Bupropion Other (See Comments)    Racing heart   Hydrochlorothiazide Other (See Comments)    Cramping to lower extremities    Social History:  reports that he has quit smoking. He has never used smokeless tobacco. He reports that he does not currently use alcohol. He reports that he does not currently use drugs.  Family History: Family History  Problem Relation Age of Onset   Heart attack Mother    Cancer Mother    Hypertension Mother    Arthritis Father    Asthma Father    Cancer Father    COPD Father    Heart attack Father    Hypertension Sister    Cancer Sister    Diabetes Sister    Asthma Sister    Cancer Sister    COPD Sister    Arthritis Sister    Asthma Sister    Diabetes Sister    Birth defects Maternal Grandfather    Arthritis Paternal Grandmother    Diabetes Paternal Grandmother    Arthritis Paternal Grandfather    Asthma Son    Other Neg Hx        pituitary disorder    Physical Exam: Vitals:   08/10/23 1913 08/10/23 1915 08/10/23 2000 08/10/23 2030  BP:  100/65 109/72 101/68  Pulse: (!) 112 (!) 112 (!) 104 (!) 105  Resp: (!) 22 (!) 22 (!) 22 (!) 28  Temp: 99.1 F (37.3 C)     TempSrc: Oral     SpO2: 96% 96% 94%  94%   Weight:      Height:        General: A and O x 3, morbid obesity, no acute distress Eyes: Pink conjunctiva, no scleral icterus ENT: Moist oral mucosa, neck supple, no thyromegaly Lungs: CTA B/L, no wheeze, no crackles, no use of accessory muscles Cardiovascular: Tachycardia, RRR, No carotid bruits, no JVD Abdomen: soft, positive BS, NTND, no organomegaly, not an acute abdomen GU: not examined Neuro: CN II - XII grossly intact Musculoskeletal: strength 5/5 all extremities, nonpitting edema Skin: no rash, no subcutaneous crepitation, no decubitus Psych: appropriate patient  Labs on Admission:  Recent Labs    08/10/23 1708  NA 131*  K 4.1  CL 94*  CO2 25  GLUCOSE 177*  BUN 20  CREATININE 1.40*  CALCIUM 7.7*  MG 1.0*   Recent Labs    08/10/23 1708  AST 64*  ALT 25  ALKPHOS 63  BILITOT 0.6  PROT 6.1*  ALBUMIN 2.8*   Recent Labs    08/10/23 1708  LIPASE 20   Recent Labs    08/10/23 1708  WBC 7.6  NEUTROABS 6.5  HGB 9.6*  HCT 31.3*  MCV 96.9  PLT 178    Radiological Exams on Admission: DG Chest Port 1 View  Result Date: 08/10/2023 CLINICAL DATA:  History of lung cancer. EXAM: PORTABLE CHEST 1 VIEW COMPARISON:  Chest radiograph 09/18/2022, CT chest 05/19/2023 FINDINGS: The right chest wall port is stable in position. The cardiomediastinal silhouette is grossly stable, with unchanged cardiomegaly. Confluent opacity in the right hilar/suprahilar region is unchanged compared to the prior radiograph. There is no new or worsening focal airspace disease. Blunting of the right costophrenic angle is consistent with a small pleural effusion as seen on prior CT. There is no significant left effusion. There is no pneumothorax There is no acute osseous abnormality. IMPRESSION: 1. Confluent opacity in the right hilar/suprahilar region is unchanged, again most consistent with radiation change. 2. Blunting of the right costophrenic angle consistent with a small pleural effusion  as seen on prior CT. 3. No new acute airspace opacity. Electronically Signed   By: Lesia Hausen M.D.   On: 08/10/2023 18:37    Assessment/Plan Present on Admission:  Chronic fatigue //  Progressive weakness -Secondary to anorexia, dehydration, malignancy -PT/OT consult placed   AKI -IVF hydration -BMP in AM   Hypomagnesemia -Replete with IV mag.  Mag level in a.m.   Acute respiratory failure with hypoxia //  Small cell lung cancer, right upper lobe  -Patient has nocturnal oxygen, states he seldom uses it in the daytime. CXR unrevealing, will add CT chest without contrast -Patient's home med of MSIR 15 mg p.o. every 6 hours as needed resumed.   Essential hypertension -Currently borderline blood pressure.  BP meds held.  Gentle IV fluid hydration   Hyperlipidemia -Pravastatin resumed   Type 2 diabetes mellitus (HCC) -has not taken Glargine in the last 4-5 days b/c not eating.  Current glucose level 92. -Sliding scale insulin ordered.  Glargine 24 units subcu on hold until patient's appetite improved   OSA (obstructive sleep apnea) -CPaP w/ O2 resumed   History of pulmonary embolism -Eliquis continued   Chronic back pain - MSIR 15 mg p.o. every 6 hours as needed resumed. -CT spine done last on 6//24.  Will do CT L-spine, evaluate for mets  Adam Wise 08/10/2023, 9:14 PM

## 2023-08-10 NOTE — ED Triage Notes (Addendum)
Pt BIB ems from home c/o weakness. Per ems pt was tachycardic on arrival at 146. bolus NS given en route. Pt states was seen on the 18th for similar symptoms. Hx lung cancer, due for chemo treatment tomorrow. Pt o2 88% room air.

## 2023-08-10 NOTE — ED Provider Notes (Signed)
Adam Wise   CSN: [id number] Arrival date & time: 08/10/23  1644     History  Chief Complaint  Patient presents with   Weakness    Adam Wise is a 72 y.o. male.  Patient is a 72 year old male with a past medical history of lung cancer on chemotherapy hypertension, diabetes presenting to the emergency department with weakness.  Patient is here with his family members who states that over the last week he has had significant increase of his weakness.  Patient states that he has had nausea and decreased appetite and has not been eating or drinking much.  He denies any vomiting.  He states that he has chronic abdominal pain is no worse than usual.  He denies any fevers or chills.  He denies any chest pain or shortness of breath.  He has significant cough.  He states that he was too weak to get into his truck yesterday and slid down to the ground but denies hitting his head or losing consciousness.  The history is provided by the patient and a relative.  Weakness      Home Medications Prior to Admission medications   Medication Sig Start Date End Date Taking? Authorizing Provider  albuterol (VENTOLIN HFA) 108 (90 Base) MCG/ACT inhaler Inhale 2 puffs into the lungs every 6 (six) hours as needed for wheezing or shortness of breath. 03/14/20   Arvilla Market, MD  amLODipine (NORVASC) 10 MG tablet TAKE 1 TABLET BY MOUTH EVERY DAY FOR BLOOD PRESSURE Patient taking differently: Take 10 mg by mouth daily. 04/30/23   Doreene Nest, NP  B-D 3CC LUER-LOK SYR 22GX1" 22G X 1" 3 ML MISC USE AS INSTRUCTED FOR TESTOSTERONE INJECTION EVERY 2 WEEKS 03/20/21   Doreene Nest, NP  Black Pepper-Turmeric (TURMERIC COMPLEX/BLACK PEPPER PO) Take 1 tablet by mouth in the morning and at bedtime.    [provider]  blood glucose meter kit and supplies KIT Dispense based on patient and insurance preference. Use up to four times  daily as directed. 04/09/18   Doreene Nest, NP  Coenzyme Q10 (COQ10) 200 MG CAPS Take 200 mg by mouth at bedtime.    [provider]  cyclobenzaprine (FLEXERIL) 5 MG tablet TAKE 1 TABLET BY MOUTH AT BEDTIME AS NEEDED FOR MUSCLE SPASMS. 07/21/23   Doreene Nest, NP  docusate sodium (COLACE) 100 MG capsule Take 1 capsule (100 mg total) by mouth every 12 (twelve) hours. 08/02/23   Anders Simmonds T, DO  ELIQUIS 5 MG TABS tablet TAKE 1 TABLET BY MOUTH TWICE A DAY Patient taking differently: Take 5 mg by mouth 2 (two) times daily. 05/03/23   Heilingoetter, Cassandra L, PA-C  esomeprazole (NEXIUM) 20 MG capsule Take 20 mg by mouth in the morning.    [provider]  gabapentin (NEURONTIN) 300 MG capsule TAKE 1 CAPSULE (300 MG TOTAL) BY MOUTH 2 (TWO) TIMES DAILY. FOR BACK PAIN. 07/12/23   Doreene Nest, NP  glipiZIDE (GLUCOTROL) 5 MG tablet Take 5 mg by mouth 2 (two) times daily before a meal.    [provider]  glucose blood (ONETOUCH ULTRA) test strip USE UP TO 4 TIMES DAILY AS DIRECTED 07/29/22   Doreene Nest, NP  Insulin Glargine (BASAGLAR KWIKPEN) 100 UNIT/ML INJECT 24 UNITS INTO THE SKIN AT BEDTIME. FOR DIABETES. Patient taking differently: Inject 0-24 Units into the skin See admin instructions. Inject 24 units into the skin at  bedtime and PER SLIDING SCALE in the morning AS NEEDED FOR AN ELEVATED BGL, possibly caused by other prescribed medications 03/25/23   Doreene Nest, NP  Insulin Pen Needle (BD PEN NEEDLE NANO U/F) 32G X 4 MM MISC Use with insulin as prescribed Dx Code: E11.9 05/20/23   Doreene Nest, NP  ipratropium-albuterol (DUONEB) 0.5-2.5 (3) MG/3ML SOLN Take 3 mLs by nebulization every 4 (four) hours while awake for 3 days, THEN 3 mLs every 4 (four) hours as needed (shortness of breath or wheezing). 10/13/21 01/18/24  Narda Bonds, MD  KRILL OIL PO Take 1 capsule by mouth daily.    [provider]  Lancets MISC USE UP TO 3 TIMES  DAILY AS DIRECTED 05/06/18   Doreene Nest, NP  lidocaine (LIDODERM) 5 % Place 1 patch onto the skin daily. Remove & Discard patch within 12 hours or as directed by MD 05/19/23   Kommor, Wyn Forster, MD  losartan (COZAAR) 50 MG tablet TAKE 1 TABLET (50 MG TOTAL) BY MOUTH DAILY. FOR BLOOD PRESSURE. 05/20/23   Doreene Nest, NP  Melatonin 10 MG CAPS Take 10 mg by mouth at bedtime as needed (for sleep).    [provider]  metFORMIN (GLUCOPHAGE) 1000 MG tablet TAKE 1 TABLET (1,000 MG TOTAL) BY MOUTH 2 (TWO) TIMES DAILY WITH A MEAL. FOR DIABETES 05/20/23   Doreene Nest, NP  methylPREDNISolone (MEDROL DOSEPAK) 4 MG TBPK tablet Use as instructed 05/05/23   Si Gaul, MD  morphine (MSIR) 15 MG tablet Take 15 mg by mouth every 6 (six) hours as needed for severe pain.    [provider]  Multiple Vitamin (MULTI-VITAMINS) TABS Take 1 tablet by mouth daily with breakfast.    [provider]  ondansetron (ZOFRAN) 4 MG tablet Take 1 tablet (4 mg total) by mouth every 6 (six) hours. 08/02/23   Arletha Pili, DO  oxyCODONE-acetaminophen (PERCOCET) 7.5-325 MG tablet Take 1 tablet by mouth every 8 (eight) hours as needed for severe pain.    [provider]  oxymetazoline (AFRIN) 0.05 % nasal spray Place 1 spray into both nostrils daily as needed for congestion.    [provider]  pioglitazone (ACTOS) 45 MG tablet TAKE 1 TABLET (45 MG TOTAL) BY MOUTH DAILY. FOR DIABETES. 05/20/23   Doreene Nest, NP  pravastatin (PRAVACHOL) 40 MG tablet TAKE 1 TABLET BY MOUTH EVERY DAY IN THE EVENING FOR CHOLESTEROL 05/28/23   Doreene Nest, NP  PRESCRIPTION MEDICATION CPAP- At bedtime    [provider]  testosterone cypionate (DEPOTESTOSTERONE CYPIONATE) 200 MG/ML injection Inject 200 mg into the muscle every 14 (fourteen) days.    [provider]      Allergies    Bupropion and Hydrochlorothiazide    Review of Systems   Review of Systems   Neurological:  Positive for weakness.    Physical Exam Updated Vital Signs BP 101/68   Pulse (!) 105   Temp 99.1 F (37.3 C) (Oral)   Resp (!) 28   Ht 5\' 9"  (1.753 m)   Wt 128.4 kg   SpO2 94%   BMI 41.79 kg/m  Physical Exam Vitals and nursing Wise reviewed.  Constitutional:      Appearance: Normal appearance. He is obese. He is ill-appearing. He is not toxic-appearing.  HENT:     Head: Normocephalic and atraumatic.     Nose: Nose normal.     Mouth/Throat:     Mouth: Mucous membranes are dry.  Pharynx: Oropharynx is clear.  Eyes:     Extraocular Movements: Extraocular movements intact.     Conjunctiva/sclera: Conjunctivae normal.  Cardiovascular:     Rate and Rhythm: Regular rhythm. Tachycardia present.     Heart sounds: Normal heart sounds.  Pulmonary:     Effort: Pulmonary effort is normal.     Breath sounds: Normal breath sounds.  Abdominal:     General: Abdomen is flat.     Palpations: Abdomen is soft.     Tenderness: There is no abdominal tenderness.  Musculoskeletal:        General: Normal range of motion.     Cervical back: Normal range of motion.  Skin:    General: Skin is warm and dry.  Neurological:     General: No focal deficit present.     Mental Status: He is alert and oriented to person, place, and time.     Sensory: No sensory deficit.     Motor: No weakness.  Psychiatric:        Mood and Affect: Mood normal.        Behavior: Behavior normal.     ED Results / Procedures / Treatments   Labs (all labs ordered are listed, but only abnormal results are displayed) Labs Reviewed  CBC WITH DIFFERENTIAL/PLATELET - Abnormal; Notable for the following components:      Result Value   RBC 3.23 (*)    Hemoglobin 9.6 (*)    HCT 31.3 (*)    RDW 16.4 (*)    Lymphs Abs 0.3 (*)    All other components within normal limits  COMPREHENSIVE METABOLIC PANEL - Abnormal; Notable for the following components:   Sodium 131 (*)    Chloride 94 (*)     Glucose, Bld 177 (*)    Creatinine, Ser 1.40 (*)    Calcium 7.7 (*)    Total Protein 6.1 (*)    Albumin 2.8 (*)    AST 64 (*)    GFR, Estimated 53 (*)    All other components within normal limits  MAGNESIUM - Abnormal; Notable for the following components:   Magnesium 1.0 (*)    All other components within normal limits  TROPONIN I (HIGH SENSITIVITY) - Abnormal; Notable for the following components:   Troponin I (High Sensitivity) 20 (*)    All other components within normal limits  TROPONIN I (HIGH SENSITIVITY) - Abnormal; Notable for the following components:   Troponin I (High Sensitivity) 22 (*)    All other components within normal limits  LIPASE, BLOOD  URINALYSIS, ROUTINE W REFLEX MICROSCOPIC    EKG None  Radiology DG Chest Port 1 View  Result Date: 08/10/2023 CLINICAL DATA:  History of lung cancer. EXAM: PORTABLE CHEST 1 VIEW COMPARISON:  Chest radiograph 09/18/2022, CT chest 05/19/2023 FINDINGS: The right chest wall port is stable in position. The cardiomediastinal silhouette is grossly stable, with unchanged cardiomegaly. Confluent opacity in the right hilar/suprahilar region is unchanged compared to the prior radiograph. There is no new or worsening focal airspace disease. Blunting of the right costophrenic angle is consistent with a small pleural effusion as seen on prior CT. There is no significant left effusion. There is no pneumothorax There is no acute osseous abnormality. IMPRESSION: 1. Confluent opacity in the right hilar/suprahilar region is unchanged, again most consistent with radiation change. 2. Blunting of the right costophrenic angle consistent with a small pleural effusion as seen on prior CT. 3. No new acute airspace opacity. Electronically Signed  By: Lesia Hausen M.D.   On: 08/10/2023 18:37    Procedures Procedures    Medications Ordered in ED Medications  lactated ringers bolus 1,000 mL (0 mLs Intravenous Stopping previously hung infusion 08/10/23  1921)  sodium chloride 0.9 % bolus 1,000 mL (0 mLs Intravenous Stopped 08/10/23 2056)  magnesium sulfate IVPB 2 g 50 mL (0 g Intravenous Stopped 08/10/23 2056)    ED Course/ Medical Decision Making/ A&P Clinical Course as of 08/10/23 2057  Mon Aug 10, 2023  1919 Labs with mild AKI Cr 1.4 from baseline normal, mild hyponatremia and hypomagnesemia which will be repleted. Will be given additional fluids. Troponin mildly elevated, will have repeat. Hgb at baseline. [VK]  1920 CXR without acute abnormality. With new hypoxia and dehydration will reach out to [facility] for transfer for admission. [VK]  2055 Repeat troponin is flat. [facility] has no bed availability. Patient is agreeable for admission here.  [VK]    Clinical Course User Index [VK] Rexford Maus, DO                                 Medical Decision Making This patient presents to the ED with chief complaint(s) of weakness with pertinent past medical history of lung cancer on chemotherapy, hypertension, diabetes which further complicates the presenting complaint. The complaint involves an extensive differential diagnosis and also carries with it a high risk of complications and morbidity.    The differential diagnosis includes dehydration, electrolyte abnormality, arrhythmia, anemia, infection, no focal deficits making CVA unlikely  Additional history obtained: Additional history obtained from family Records reviewed Care Everywhere/External Records -outpatient oncology records  ED Course and Reassessment: On patient's arrival he was tachycardic to the 120s and hypoxic to 88% on room air.  He was placed on 2 L nasal cannula and is satting well.  He is dry appearing and will be started on IV fluids.  He will have labs including troponin and chest x-ray as well as urine to evaluate for cause of his weakness, hypoxia and he will be closely reassessed.  Independent labs interpretation:  The following labs were independently interpreted:  hypomag, mild hyponatremia, AKI, mildly elevated troponin flat  Independent visualization of imaging: - I independently visualized the following imaging with scope of interpretation limited to determining acute life threatening conditions related to emergency care: CXR, which revealed radiation changes, no acute findings  Consultation: - Consulted or discussed management/test interpretation w/ external professional: hospitalist  Consideration for admission or further workup: patient requires admission for hypoxia and dehydration Social Determinants of health: N/A    Amount and/or Complexity of Data Reviewed Labs: ordered. Radiology: ordered.  Risk Prescription drug management. Decision regarding hospitalization.          Final Clinical Impression(s) / ED Diagnoses Final diagnoses:  Hypoxia  Hypomagnesemia  Dehydration  AKI (acute kidney injury) [facility])    Rx / DC Orders ED Discharge Orders     None         Rexford Maus, DO 08/10/23 2057

## 2023-08-10 NOTE — ED Notes (Signed)
ED TO INPATIENT HANDOFF REPORT  ED Nurse Name and Phone #: Tresa Endo [phone]  S Name/Age/Gender Adam Wise 72 y.o. male Room/Bed: WA05/WA05  Code Status   Code Status: Full Code  Home/SNF/Other Home Patient oriented to: self, place, time, and situation Is this baseline? Yes   Triage Complete: Triage complete  Chief Complaint General weakness [R53.1]  Triage Note Pt BIB ems from home c/o weakness. Per ems pt was tachycardic on arrival at 146. bolus NS given en route. Pt states was seen on the 18th for similar symptoms. Hx lung cancer, due for chemo treatment tomorrow. Pt o2 88% room air.   Allergies Allergies  Allergen Reactions   Bupropion Other (See Comments)    Racing heart   Hydrochlorothiazide Other (See Comments)    Cramping to lower extremities    Level of Care/Admitting Diagnosis ED Disposition     ED Disposition  Admit   Condition  --   Comment  Hospital Area: [facility] [[id number]]  Level of Care: Med-Surg [16]  May place patient in observation at [facility] or [facility] if equivalent level of care is available:: Yes  Covid Evaluation: Confirmed COVID Negative  Diagnosis: General weakness [301173]  Admitting Physician: Alvester Chou  Attending Physician: Alvester Chou          B Medical/Surgery History Past Medical History:  Diagnosis Date   Acute on chronic respiratory failure with hypoxia (HCC) 10/12/2021   Chickenpox    Chronic knee pain    Chronic low back pain    COPD exacerbation (HCC) 10/11/2021   COPD with exacerbation (HCC) 10/12/2021   Coronary artery calcification seen on CAT scan 11/2021   Coronary CTA 11/27/2021: Moderate to large right pleural effusion and compressive atelectasis right lung base. => Coronary Calcium Score 657.  Diffuse RCA calcification.  Minimal mild disease in the LAD and diagonal branches. == Overall limited study.  Notable artifact.   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 He has disease progression after the first line of chemotherapy in December 2021.   Testosterone deficiency    Type 2 diabetes mellitus (HCC)    Past Surgical History:  Procedure Laterality Date   CHEST TUBE INSERTION Right 01/20/2022   Procedure: CHEST TUBE INSERTION;  Surgeon: Lorin Glass, MD;  Location: [facility] ENDOSCOPY;  Service: Pulmonary;  Laterality: Right;  w/ Talc Pleurodesis, planned admit for Obs afterwards   CHEST TUBE INSERTION Right 04/28/2022   Procedure: INSERTION PLEURAL DRAINAGE CATHETER - Pigtail tail, drainage;  Surgeon: Lorin Glass, MD;  Location: [facility] ENDOSCOPY;  Service: Pulmonary;  Laterality: Right;  talc pleurodesis   CHEST TUBE INSERTION Right 09/01/2022   Procedure: INSERTION PLEURAL DRAINAGE CATHETER;  Surgeon: Lupita Leash, MD;  Location: [facility] ENDOSCOPY;  Service: Cardiopulmonary;  Laterality: Right;  pigtail catheter placement with talc pleurodesis   COLONOSCOPY WITH PROPOFOL N/A 12/17/2018   Procedure: COLONOSCOPY WITH PROPOFOL;  Surgeon: Pasty Spillers, MD;  Location: [facility] ENDOSCOPY;  Service: Gastroenterology;  Laterality: N/A;   IR IMAGING GUIDED PORT INSERTION  04/17/2020   IR THORACENTESIS ASP  PLEURAL SPACE W/IMG GUIDE  11/29/2021   IR THORACENTESIS ASP PLEURAL SPACE W/IMG GUIDE  01/03/2022   JOINT REPLACEMENT Bilateral    REPLACEMENT TOTAL KNEE BILATERAL  2015   TALC PLEURODESIS  09/01/2022   Procedure: Lurlean Nanny;  Surgeon: Lupita Leash, MD;  Location: [facility] ENDOSCOPY;  Service: Cardiopulmonary;;   TONSILLECTOMY  1960    TRANSTHORACIC ECHOCARDIOGRAM  05/2020   a) 05/2020: EF 55 to 60%.  No R WMA.  Mild LVH.  Indeterminate LVEDP.  Unable to assess RVP.  Normal aortic and mitral valves.  Mildly elevated RAP.; b) 09/2021: EF 50-55%. No RWMA. Mild LVH. ~ LVEDP. Mild LA Dilation. NORMAL RV/RAP.  Normal MV/AoV.     A IV Location/Drains/Wounds Patient Lines/Drains/Airways Status     Active Line/Drains/Airways     Name Placement date Placement time Site Days   Peripheral IV 08/10/23 20 G Left Antecubital 08/10/23  1658  Antecubital  less than 1            Intake/Output Last 24 hours No intake or output data in the 24 hours ending 08/10/23 2152  Labs/Imaging Results for orders placed or performed during the hospital encounter of 08/10/23 (from the past 48 hour(s))  CBC with Differential     Status: Abnormal   Collection Time: 08/10/23  5:08 PM  Result Value Ref Range   WBC 7.6 4.0 - 10.5 K/uL   RBC 3.23 (L) 4.22 - 5.81 MIL/uL   Hemoglobin 9.6 (L) 13.0 - 17.0 g/dL   HCT 16.1 (L) 09.6 - 04.5 %   MCV 96.9 80.0 - 100.0 fL   MCH 29.7 26.0 - 34.0 pg   MCHC 30.7 30.0 - 36.0 g/dL   RDW 40.9 (H) 81.1 - 91.4 %   Platelets 178 150 - 400 K/uL   nRBC 0.0 0.0 - 0.2 %   Neutrophils Relative % 86 %   Neutro Abs 6.5 1.7 - 7.7 K/uL   Lymphocytes Relative 4 %   Lymphs Abs 0.3 (L) 0.7 - 4.0 K/uL   Monocytes Relative 10 %   Monocytes Absolute 0.8 0.1 - 1.0 K/uL   Eosinophils Relative 0 %   Eosinophils Absolute 0.0 0.0 - 0.5 K/uL   Basophils Relative 0 %   Basophils Absolute 0.0 0.0 - 0.1 K/uL   Immature Granulocytes 0 %   Abs Immature Granulocytes 0.02 0.00 - 0.07 K/uL    Comment: Performed at [place] [facility], [address], [place], [place] [address]  Comprehensive metabolic panel     Status: Abnormal   Collection Time: 08/10/23  5:08 PM  Result Value Ref Range   Sodium 131 (L) 135 - 145 mmol/L   Potassium 4.1 3.5 - 5.1 mmol/L   Chloride 94 (L) 98 - 111 mmol/L   CO2 25 22 - 32 mmol/L    Glucose, Bld 177 (H) 70 - 99 mg/dL    Comment: Glucose reference range applies only to samples taken after fasting for at least 8 hours.   BUN 20 8 - 23 mg/dL   Creatinine, Ser 6.21 (H) 0.61 - 1.24 mg/dL   Calcium 7.7 (L) 8.9 - 10.3 mg/dL   Total Protein 6.1 (L) 6.5 - 8.1 g/dL   Albumin 2.8 (L) 3.5 - 5.0 g/dL   AST 64 (H) 15 - 41 U/L   ALT 25 0 - 44 U/L   Alkaline Phosphatase 63 38 - 126 U/L   Total Bilirubin 0.6 0.3 - 1.2 mg/dL   GFR, Estimated 53 (L) >  60 mL/min    Comment: (NOTE) Calculated using the CKD-EPI Creatinine Equation (2021)    Anion gap 12 5 - 15    Comment: Performed at [facility], [address], [place], [place] [address]  Troponin I (High Sensitivity)     Status: Abnormal   Collection Time: 08/10/23  5:08 PM  Result Value Ref Range   Troponin I (High Sensitivity) 20 (H) <18 ng/L    Comment: (NOTE) Elevated high sensitivity troponin I (hsTnI) values and significant  changes across serial measurements may suggest ACS but many other  chronic and acute conditions are known to elevate hsTnI results.  Refer to the "Links" section for chest pain algorithms and additional  guidance. Performed at [facility], [address], [place], [place] [address]   Lipase, blood     Status: None   Collection Time: 08/10/23  5:08 PM  Result Value Ref Range   Lipase 20 11 - 51 U/L    Comment: Performed at [facility], [address], [place], [place] [address]  Magnesium     Status: Abnormal   Collection Time: 08/10/23  5:08 PM  Result Value Ref Range   Magnesium 1.0 (L) 1.7 - 2.4 mg/dL    Comment: Performed at [facility], [address], [place], [place] [address]  Troponin I (High Sensitivity)     Status: Abnormal   Collection Time: 08/10/23  7:41 PM  Result Value Ref Range   Troponin I (High Sensitivity) 22 (H) <18 ng/L    Comment: (NOTE) Elevated high sensitivity troponin I (hsTnI) values and  significant  changes across serial measurements may suggest ACS but many other  chronic and acute conditions are known to elevate hsTnI results.  Refer to the "Links" section for chest pain algorithms and additional  guidance. Performed at [facility], [address], [place], [place] [address]    *Note: Due to a large number of results and/or encounters for the requested time period, some results have not been displayed. A complete set of results can be found in Results Review.   DG Chest Port 1 View  Result Date: 08/10/2023 CLINICAL DATA:  History of lung cancer. EXAM: PORTABLE CHEST 1 VIEW COMPARISON:  Chest radiograph 09/18/2022, CT chest 05/19/2023 FINDINGS: The right chest wall port is stable in position. The cardiomediastinal silhouette is grossly stable, with unchanged cardiomegaly. Confluent opacity in the right hilar/suprahilar region is unchanged compared to the prior radiograph. There is no new or worsening focal airspace disease. Blunting of the right costophrenic angle is consistent with a small pleural effusion as seen on prior CT. There is no significant left effusion. There is no pneumothorax There is no acute osseous abnormality. IMPRESSION: 1. Confluent opacity in the right hilar/suprahilar region is unchanged, again most consistent with radiation change. 2. Blunting of the right costophrenic angle consistent with a small pleural effusion as seen on prior CT. 3. No new acute airspace opacity. Electronically Signed   By: Lesia Hausen M.D.   On: 08/10/2023 18:37    Pending Labs Unresulted Labs (From admission, onward)     Start     Ordered   08/11/23 0500  Basic metabolic panel  Tomorrow morning,   R        08/10/23 2150   08/11/23 0500  Magnesium  Tomorrow morning,   R        08/10/23 2150   08/11/23 0500  CBC with Differential/Platelet  Tomorrow morning,   R        08/10/23 2150   08/10/23 1741  Urinalysis, Routine w reflex microscopic -Urine, Clean  Catch  Once,   URGENT       Question:  Specimen Source  Answer:  Urine, Clean Catch   08/10/23 1743            Vitals/Pain Today's Vitals   08/10/23 1913 08/10/23 1915 08/10/23 2000 08/10/23 2030  BP:  100/65 109/72 101/68  Pulse: (!) 112 (!) 112 (!) 104 (!) 105  Resp: (!) 22 (!) 22 (!) 22 (!) 28  Temp: 99.1 F (37.3 C)     TempSrc: Oral     SpO2: 96% 96% 94% 94%  Weight:      Height:      PainSc:        Isolation Precautions No active isolations  Medications Medications  insulin aspart (novoLOG) injection 0-15 Units (has no administration in time range)  insulin aspart (novoLOG) injection 0-5 Units (has no administration in time range)  0.9 %  sodium chloride infusion (has no administration in time range)  apixaban (ELIQUIS) tablet 5 mg (has no administration in time range)  pantoprazole (PROTONIX) EC tablet 40 mg (has no administration in time range)  gabapentin (NEURONTIN) capsule 300 mg (has no administration in time range)  melatonin tablet 10 mg (has no administration in time range)  morphine (MSIR) tablet 15 mg (has no administration in time range)  pravastatin (PRAVACHOL) tablet 40 mg (has no administration in time range)  morphine (MSIR) tablet 15 mg (has no administration in time range)  acetaminophen (TYLENOL) tablet 650 mg (has no administration in time range)    Or  acetaminophen (TYLENOL) suppository 650 mg (has no administration in time range)  senna-docusate (Senokot-S) tablet 1 tablet (has no administration in time range)  lactated ringers bolus 1,000 mL (0 mLs Intravenous Stopping previously hung infusion 08/10/23 1921)  sodium chloride 0.9 % bolus 1,000 mL (0 mLs Intravenous Stopped 08/10/23 2056)  magnesium sulfate IVPB 2 g 50 mL (0 g Intravenous Stopped 08/10/23 2056)    Mobility walks     Focused Assessments Pulmonary Assessment Handoff:  Lung sounds:   O2 Device: Nasal Cannula O2 Flow Rate (L/min): 2 L/min    R Recommendations: See  Admitting Provider Note  Report given to:   Additional Notes:

## 2023-08-11 ENCOUNTER — Inpatient Hospital Stay (HOSPITAL_COMMUNITY): Payer: Medicare Other

## 2023-08-11 ENCOUNTER — Encounter (HOSPITAL_COMMUNITY): Payer: Medicare Other

## 2023-08-11 ENCOUNTER — Encounter (HOSPITAL_COMMUNITY): Payer: Self-pay | Admitting: Family Medicine

## 2023-08-11 ENCOUNTER — Observation Stay (HOSPITAL_COMMUNITY): Payer: Medicare Other

## 2023-08-11 DIAGNOSIS — M47813 Spondylosis without myelopathy or radiculopathy, cervicothoracic region: Secondary | ICD-10-CM | POA: Diagnosis not present

## 2023-08-11 DIAGNOSIS — C787 Secondary malignant neoplasm of liver and intrahepatic bile duct: Secondary | ICD-10-CM | POA: Diagnosis present

## 2023-08-11 DIAGNOSIS — J9601 Acute respiratory failure with hypoxia: Secondary | ICD-10-CM | POA: Diagnosis present

## 2023-08-11 DIAGNOSIS — J449 Chronic obstructive pulmonary disease, unspecified: Secondary | ICD-10-CM | POA: Diagnosis present

## 2023-08-11 DIAGNOSIS — R531 Weakness: Secondary | ICD-10-CM | POA: Diagnosis not present

## 2023-08-11 DIAGNOSIS — E1169 Type 2 diabetes mellitus with other specified complication: Secondary | ICD-10-CM | POA: Diagnosis present

## 2023-08-11 DIAGNOSIS — R609 Edema, unspecified: Secondary | ICD-10-CM | POA: Diagnosis not present

## 2023-08-11 DIAGNOSIS — G928 Other toxic encephalopathy: Secondary | ICD-10-CM | POA: Diagnosis not present

## 2023-08-11 DIAGNOSIS — I959 Hypotension, unspecified: Secondary | ICD-10-CM | POA: Diagnosis present

## 2023-08-11 DIAGNOSIS — R5382 Chronic fatigue, unspecified: Secondary | ICD-10-CM | POA: Diagnosis present

## 2023-08-11 DIAGNOSIS — M549 Dorsalgia, unspecified: Secondary | ICD-10-CM | POA: Diagnosis not present

## 2023-08-11 DIAGNOSIS — I2510 Atherosclerotic heart disease of native coronary artery without angina pectoris: Secondary | ICD-10-CM | POA: Diagnosis present

## 2023-08-11 DIAGNOSIS — M4802 Spinal stenosis, cervical region: Secondary | ICD-10-CM | POA: Diagnosis not present

## 2023-08-11 DIAGNOSIS — Z7189 Other specified counseling: Secondary | ICD-10-CM | POA: Diagnosis not present

## 2023-08-11 DIAGNOSIS — N179 Acute kidney failure, unspecified: Secondary | ICD-10-CM | POA: Diagnosis present

## 2023-08-11 DIAGNOSIS — M5124 Other intervertebral disc displacement, thoracic region: Secondary | ICD-10-CM | POA: Diagnosis not present

## 2023-08-11 DIAGNOSIS — C3411 Malignant neoplasm of upper lobe, right bronchus or lung: Secondary | ICD-10-CM | POA: Diagnosis present

## 2023-08-11 DIAGNOSIS — Z515 Encounter for palliative care: Secondary | ICD-10-CM | POA: Diagnosis not present

## 2023-08-11 DIAGNOSIS — E785 Hyperlipidemia, unspecified: Secondary | ICD-10-CM | POA: Diagnosis present

## 2023-08-11 DIAGNOSIS — E860 Dehydration: Secondary | ICD-10-CM | POA: Diagnosis present

## 2023-08-11 DIAGNOSIS — M545 Low back pain, unspecified: Secondary | ICD-10-CM | POA: Diagnosis present

## 2023-08-11 DIAGNOSIS — K2970 Gastritis, unspecified, without bleeding: Secondary | ICD-10-CM | POA: Diagnosis present

## 2023-08-11 DIAGNOSIS — M5134 Other intervertebral disc degeneration, thoracic region: Secondary | ICD-10-CM | POA: Diagnosis not present

## 2023-08-11 DIAGNOSIS — G893 Neoplasm related pain (acute) (chronic): Secondary | ICD-10-CM | POA: Diagnosis present

## 2023-08-11 DIAGNOSIS — Z6841 Body Mass Index (BMI) 40.0 and over, adult: Secondary | ICD-10-CM | POA: Diagnosis not present

## 2023-08-11 DIAGNOSIS — G894 Chronic pain syndrome: Secondary | ICD-10-CM | POA: Diagnosis present

## 2023-08-11 DIAGNOSIS — M47812 Spondylosis without myelopathy or radiculopathy, cervical region: Secondary | ICD-10-CM | POA: Diagnosis not present

## 2023-08-11 DIAGNOSIS — T481X5A Adverse effect of skeletal muscle relaxants [neuromuscular blocking agents], initial encounter: Secondary | ICD-10-CM | POA: Diagnosis not present

## 2023-08-11 DIAGNOSIS — E876 Hypokalemia: Secondary | ICD-10-CM | POA: Diagnosis not present

## 2023-08-11 DIAGNOSIS — G4733 Obstructive sleep apnea (adult) (pediatric): Secondary | ICD-10-CM | POA: Diagnosis present

## 2023-08-11 DIAGNOSIS — M5031 Other cervical disc degeneration,  high cervical region: Secondary | ICD-10-CM | POA: Diagnosis present

## 2023-08-11 DIAGNOSIS — E871 Hypo-osmolality and hyponatremia: Secondary | ICD-10-CM | POA: Diagnosis present

## 2023-08-11 DIAGNOSIS — I509 Heart failure, unspecified: Secondary | ICD-10-CM | POA: Diagnosis not present

## 2023-08-11 DIAGNOSIS — C3490 Malignant neoplasm of unspecified part of unspecified bronchus or lung: Secondary | ICD-10-CM | POA: Diagnosis not present

## 2023-08-11 LAB — MAGNESIUM
Magnesium: 1.3 mg/dL — ABNORMAL LOW (ref 1.7–2.4)
Magnesium: 1.6 mg/dL — ABNORMAL LOW (ref 1.7–2.4)

## 2023-08-11 LAB — GLUCOSE, CAPILLARY
Glucose-Capillary: 131 mg/dL — ABNORMAL HIGH (ref 70–99)
Glucose-Capillary: 155 mg/dL — ABNORMAL HIGH (ref 70–99)
Glucose-Capillary: 137 mg/dL — ABNORMAL HIGH (ref 70–99)
Glucose-Capillary: 116 mg/dL — ABNORMAL HIGH (ref 70–99)

## 2023-08-11 LAB — BLOOD GAS, ARTERIAL
Acid-Base Excess: 5.5 mmol/L — ABNORMAL HIGH (ref 0.0–2.0)
Bicarbonate: 29.9 mmol/L — ABNORMAL HIGH (ref 20.0–28.0)
Drawn by: 560031
O2 Saturation: 98.8 %
Patient temperature: 37
pCO2 arterial: 42 mmHg (ref 32–48)
pH, Arterial: 7.46 — ABNORMAL HIGH (ref 7.35–7.45)
pO2, Arterial: 85 mmHg (ref 83–108)

## 2023-08-11 LAB — BASIC METABOLIC PANEL
Anion gap: 7 (ref 5–15)
Anion gap: 10 (ref 5–15)
BUN: 16 mg/dL (ref 8–23)
BUN: 13 mg/dL (ref 8–23)
CO2: 27 mmol/L (ref 22–32)
CO2: 26 mmol/L (ref 22–32)
Calcium: 7.3 mg/dL — ABNORMAL LOW (ref 8.9–10.3)
Calcium: 7.8 mg/dL — ABNORMAL LOW (ref 8.9–10.3)
Chloride: 97 mmol/L — ABNORMAL LOW (ref 98–111)
Chloride: 96 mmol/L — ABNORMAL LOW (ref 98–111)
Creatinine, Ser: 1.18 mg/dL (ref 0.61–1.24)
Creatinine, Ser: 0.97 mg/dL (ref 0.61–1.24)
GFR, Estimated: >60 mL/min (ref >60)
GFR, Estimated: >60 mL/min (ref >60)
Glucose, Bld: 123 mg/dL — ABNORMAL HIGH (ref 70–99)
Glucose, Bld: 132 mg/dL — ABNORMAL HIGH (ref 70–99)
Potassium: 3.4 mmol/L — ABNORMAL LOW (ref 3.5–5.1)
Potassium: 4 mmol/L (ref 3.5–5.1)
Sodium: 131 mmol/L — ABNORMAL LOW (ref 135–145)
Sodium: 132 mmol/L — ABNORMAL LOW (ref 135–145)

## 2023-08-11 LAB — CBC WITH DIFFERENTIAL/PLATELET
Abs Immature Granulocytes: 0.01 10*3/uL (ref 0.00–0.07)
Basophils Absolute: 0 10*3/uL (ref 0.0–0.1)
Basophils Relative: 0 %
Eosinophils Absolute: 0 10*3/uL (ref 0.0–0.5)
Eosinophils Relative: 1 %
HCT: 28.2 % — ABNORMAL LOW (ref 39.0–52.0)
Hemoglobin: 8.6 g/dL — ABNORMAL LOW (ref 13.0–17.0)
Immature Granulocytes: 0 %
Lymphocytes Relative: 6 %
Lymphs Abs: 0.3 10*3/uL — ABNORMAL LOW (ref 0.7–4.0)
MCH: 29.7 pg (ref 26.0–34.0)
MCHC: 30.5 g/dL (ref 30.0–36.0)
MCV: 97.2 fL (ref 80.0–100.0)
Monocytes Absolute: 0.6 10*3/uL (ref 0.1–1.0)
Monocytes Relative: 11 %
Neutro Abs: 4.4 10*3/uL (ref 1.7–7.7)
Neutrophils Relative %: 82 %
Platelets: 134 10*3/uL — ABNORMAL LOW (ref 150–400)
RBC: 2.9 MIL/uL — ABNORMAL LOW (ref 4.22–5.81)
RDW: 16.3 % — ABNORMAL HIGH (ref 11.5–15.5)
WBC: 5.4 10*3/uL (ref 4.0–10.5)
nRBC: 0 % (ref 0.0–0.2)

## 2023-08-11 MED ORDER — GABAPENTIN 300 MG PO CAPS
300.0000 mg | ORAL_CAPSULE | Freq: Three times a day (TID) | ORAL | Status: DC
  Administered 2023-08-11: 300 mg via ORAL
  Filled 2023-08-11: qty 1

## 2023-08-11 MED ORDER — MAGNESIUM SULFATE 2 GM/50ML IV SOLN
2.0000 g | Freq: Once | INTRAVENOUS | Status: AC
  Administered 2023-08-11: 2 g via INTRAVENOUS
  Filled 2023-08-11: qty 50

## 2023-08-11 MED ORDER — LIDOCAINE 5 % EX PTCH
2.0000 | MEDICATED_PATCH | CUTANEOUS | Status: DC
  Administered 2023-08-11 – 2023-08-14 (×4): 2 via TRANSDERMAL
  Filled 2023-08-11 (×4): qty 2

## 2023-08-11 MED ORDER — MORPHINE SULFATE 15 MG PO TABS
30.0000 mg | ORAL_TABLET | ORAL | Status: DC | PRN
  Administered 2023-08-11 – 2023-08-12 (×3): 30 mg via ORAL
  Filled 2023-08-11 (×3): qty 2

## 2023-08-11 MED ORDER — DOCUSATE SODIUM 100 MG PO CAPS
100.0000 mg | ORAL_CAPSULE | Freq: Two times a day (BID) | ORAL | Status: DC
  Administered 2023-08-11 – 2023-08-12 (×2): 100 mg via ORAL
  Filled 2023-08-11 (×2): qty 1

## 2023-08-11 MED ORDER — MORPHINE SULFATE ER 15 MG PO TBCR: 15.0000 mg | EXTENDED_RELEASE_TABLET | Freq: Two times a day (BID) | ORAL | Status: DC

## 2023-08-11 MED ORDER — POTASSIUM CHLORIDE CRYS ER 20 MEQ PO TBCR
40.0000 meq | EXTENDED_RELEASE_TABLET | ORAL | Status: AC
  Administered 2023-08-11 (×2): 40 meq via ORAL
  Filled 2023-08-11 (×2): qty 2

## 2023-08-11 MED ORDER — SENNOSIDES-DOCUSATE SODIUM 8.6-50 MG PO TABS
1.0000 | ORAL_TABLET | Freq: Two times a day (BID) | ORAL | Status: DC
  Administered 2023-08-11 – 2023-08-12 (×2): 1 via ORAL
  Filled 2023-08-11 (×2): qty 1

## 2023-08-11 MED ORDER — CYCLOBENZAPRINE HCL 5 MG PO TABS
5.0000 mg | ORAL_TABLET | Freq: Three times a day (TID) | ORAL | Status: DC | PRN
  Administered 2023-08-11: 5 mg via ORAL
  Filled 2023-08-11: qty 1

## 2023-08-11 MED ORDER — HYDROMORPHONE HCL 1 MG/ML IJ SOLN
0.5000 mg | INTRAMUSCULAR | Status: DC | PRN
  Administered 2023-08-12: 0.5 mg via INTRAVENOUS
  Filled 2023-08-11: qty 0.5

## 2023-08-11 NOTE — Plan of Care (Signed)

## 2023-08-11 NOTE — Evaluation (Signed)
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  Chronic back pain, COPD, HTN, HLD, OSA on CPAP, nocturnal oxygen, T2DM.  Patient brought to the ER because of progressive weakness.   Clinical Impression   Patient admitted for the diagnosis above.  PTA he lives at home with his spouse, who can provide supportive assist and minimal physical assist.  Patient stating his weakness came on very suddenly, primarily in the lowe extremities.  Typically he is able to mobilize at home with varying AD given back pain, and continues to complete his own ADL.  Currently he is very limited by pain, and is needing Max A for just rolling in bed.  Patient unable to tolerate sitting on the edge of bed, or trying transfer this date.  Patient is stating the plan is to stabilize him at [facility], and transfer him to [facility] for ongoing treatment.  OT will follow in the acute setting to address deficits, and skilled nursing facility        If plan is discharge home, recommend the following: A lot of help with bathing/dressing/bathroom;Two people to help with walking and/or transfers    Functional Status Assessment  Patient has had a recent decline in their functional status and demonstrates the ability to make significant improvements in function in a reasonable and predictable amount of time.  Equipment Recommendations  BSC/3in1    Recommendations for Other Services       Precautions / Restrictions Precautions Precautions: Fall Restrictions Weight Bearing Restrictions: No      Mobility Bed Mobility Overal bed mobility: Needs Assistance Bed Mobility: Sidelying to Sit, Rolling Rolling: Max assist         General bed mobility comments: unable to tolerate moving to get to the EOB Patient Response:  Cooperative  Transfers                   General transfer comment: unable      Balance                                           ADL either performed or assessed with clinical judgement   ADL Overall ADL's : Needs assistance/impaired Eating/Feeding: Set up;Bed level   Grooming: Wash/dry hands;Wash/dry face;Set up;Bed level   Upper Body Bathing: Minimal assistance;Bed level   Lower Body Bathing: Maximal assistance;Bed level   Upper Body Dressing : Set up;Bed level   Lower Body Dressing: Maximal assistance;Bed level                       Vision Patient Visual Report: No change from baseline       Perception Perception: Within Functional Limits       Praxis Praxis: [facility]       Pertinent Vitals/Pain Pain Assessment Pain Assessment: Faces Faces Pain Scale: Hurts whole lot Pain Location: Mid back to low back Pain Descriptors / Indicators: Grimacing, Guarding, Shooting, Sharp, Pressure Pain Intervention(s): Monitored during session     Extremity/Trunk Assessment Upper Extremity Assessment Upper Extremity Assessment: Generalized weakness   Lower Extremity Assessment Lower Extremity Assessment: Defer to PT evaluation       Communication Communication Communication: No apparent difficulties   Cognition Arousal: Alert Behavior  During Therapy: WFL for tasks assessed/performed Overall Cognitive Status: Within Functional Limits for tasks assessed                                       General Comments   VSS on RA    Exercises     Shoulder Instructions      Home Living Family/patient expects to be discharged to:: Private residence Living Arrangements: Spouse/significant other Available Help at Discharge: Family;Available 24 hours/day Type of Home: House Home Access: Stairs to enter [employer] of Steps: 4 Entrance Stairs-Rails: Right Home Layout: Two level;Able to live on main level with  bedroom/bathroom     Bathroom Shower/Tub: Producer, television/film/video: Handicapped height Bathroom Accessibility: Yes How Accessible: Accessible via walker Home Equipment: Rolling Walker (2 wheels);Rollator (4 wheels);Cane - single point;Shower seat - built in;Wheelchair - manual          Prior Functioning/Environment Prior Level of Function : Independent/Modified Independent             Mobility Comments: Using a RW currently ADLs Comments: Mod I at home with ADL.  Spouse assists as needed with iADL and community mobility given back pain that day.        OT Problem List: Decreased strength;Decreased range of motion;Decreased activity tolerance;Pain      OT Treatment/Interventions: Self-care/ADL training;Therapeutic activities;Therapeutic exercise;Balance training;DME and/or AE instruction;Patient/family education    OT Goals(Current goals can be found in the care plan section) Acute Rehab OT Goals Patient Stated Goal: Return home OT Goal Formulation: With patient Time For Goal Achievement: 08/25/23 Potential to Achieve Goals: Fair ADL Goals Pt Will Perform Grooming: with set-up;sitting Pt Will Perform Upper Body Dressing: with set-up;sitting Pt Will Perform Lower Body Dressing: with min assist;sitting/lateral leans Pt Will Transfer to Toilet: with mod assist;stand pivot transfer;bedside commode  OT Frequency: Min 1X/week    Co-evaluation              AM-PAC OT "6 Clicks" Daily Activity     Outcome Measure Help from another person eating meals?: None Help from another person taking care of personal grooming?: None Help from another person toileting, which includes using toliet, bedpan, or urinal?: A Lot Help from another person bathing (including washing, rinsing, drying)?: A Lot Help from another person to put on and taking off regular upper body clothing?: A Little Help from another person to put on and taking off regular lower body clothing?: A Lot 6  Click Score: 17   End of Session Nurse Communication: Mobility status  Activity Tolerance: Patient limited by pain Patient left: in bed;with call bell/phone within reach  OT Visit Diagnosis: Pain Pain - part of body:  (back)                Time: 1610-9604 OT Time Calculation (min): 25 min Charges:  OT General Charges $OT Visit: 1 Visit OT Evaluation $OT Eval Moderate Complexity: 1 Mod OT Treatments $Self Care/Home Management : 8-22 mins  08/11/2023  RP, OTR/L  Acute Rehabilitation Services  Office:  [phone]   Suzanna Obey 08/11/2023, 9:06 AM

## 2023-08-11 NOTE — Progress Notes (Signed)
Pt self administers his home CPAP.  It is at bedside within patients reach.

## 2023-08-11 NOTE — Progress Notes (Signed)
[facility] Progress Note Patient: Adam Wise NGE:[id number] DOB: 04-27-1951 DOA: 08/10/2023  DOS: the patient was seen and examined on 08/11/2023  Brief hospital course: PMH of SCLC, on palliative chemo, chronic back pain, COPD, HTN, HLD, OSA on CPAP, type II DM, obesity present to the hospital with complaints of back pain and generalized weakness. He had poor p.o. intake for last few days as well. He went to the to the bathroom, reportedly had a fall but unable to stand up. Currently being treated for AKI, hypomagnesemia, hypokalemia, acute on chronic cancer related back pain and toxic encephalopathy secondary to Flexeril and gabapentin.  Assessment and Plan: Acute kidney injury. Hypokalemia Hypomagnesemia. Replacing orally. Treated with IV hydration. Serum creatinine improving.  Monitor.  Generalized weakness and deconditioning. In the setting of hypotension. For now blood pressure has improved and therefore we will monitor closely.  HTN. On Norvasc 10 mg as well as losartan. Blood pressure actually soft. Currently holding both medication.  Acute toxic encephalopathy. Patient becomes confused after receiving morphine, Flexeril and gabapentin. Will discontinue Flexeril and gabapentin and monitor.  ABG unremarkable.  No focal deficit.  Mild gastritis is seen.  Midthoracic and surrounding pain. MRI ordered. Monitor.  Small cell lung cancer  On tarlatamab at [facility]. Per wife original plan was to transfer the patient to [facility] from ED. Currently no indication for higher level care. For now we will monitor closely. Discussed with Dr. Arbutus Ped.  Obesity. Placing the patient at high risk for poor outcome.  Chronic pain syndrome. Continue MS Contin. Patient takes MS IR 30 mg 3 times a day on a scheduled basis. Monitor for now. Palliative care consulted To get comfortable pain control.  Subjective: Somewhat confused in the afternoon after receiving pain medication  otherwise no other acute complaint.  Continues to have back pain.  Physical Exam: General: in Mild distress, No Rash Cardiovascular: S1 and S2 Present, No Murmur Respiratory: Good respiratory effort, Bilateral Air entry present. No Crackles, No wheezes Abdomen: Bowel Sound present, No tenderness Extremities: No edema Neuro: Alert and oriented x3, no new focal deficit, did not remember who I am not discussing with me later in the day.  Data Reviewed: I have Reviewed nursing notes, Vitals, and Lab results. Since last encounter, pertinent lab results CBC and BMP   . I have ordered test including CBC and BMP  . I have discussed pt's care plan and test results with oncology  . I have ordered imaging MRI  .   Disposition: Status is: Inpatient Remains inpatient appropriate because: Pain control  Place TED hose Start: 08/11/23 1406 apixaban (ELIQUIS) tablet 5 mg   Family Communication: Discussed with wife on the phone Level of care: Telemetry   Vitals:   08/10/23 2208 08/10/23 2314 08/11/23 0334 08/11/23 1600  BP:  (!) 93/54 (!) 100/52 135/83  Pulse: (!) 104 97 88 (!) 105  Resp: 17 18 14 18   Temp: 98.9 F (37.2 C) 98.8 F (37.1 C) 98.6 F (37 C) 99.7 F (37.6 C)  TempSrc: Oral Oral Oral Oral  SpO2: 95% 97%  94%  Weight:      Height:         Author: Lynden Oxford, MD 08/11/2023 7:00 PM  Please look on [email or web address] to find out who is on call.

## 2023-08-11 NOTE — Progress Notes (Signed)
RT NOTE:  ABG obtained at 1819, sent to lab at [facility] with lab notified at [facility].

## 2023-08-11 NOTE — Evaluation (Signed)
Physical Therapy Evaluation Patient Details Name: Adam Wise MRN: [id number] DOB: 02/01/1951 Today's Date: 08/11/2023  History of Present Illness  Pt is 72 yo male admitted on 08/10/23 with progressive weakness and back pain.  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  Other hx includes but not limited to chronic back pain, COPD, HTN, HLD, OSA on CPAP, DM2  Clinical Impression  Pt admitted with above diagnosis. At baseline, pt ambulatory with RW.  He lives with wife and has necessary DME.  Pt does have chronic back pain but tends to be lower back and now has pain upper/mid L back that is limiting his mobility.  He required mod A to sit but then able to stand with min A and ambulate 70' using RW.  Pt could likely progress to being able to d/c home with HHPT once pain controlled.  Pt currently with functional limitations due to the deficits listed below (see PT Problem List). Pt will benefit from acute skilled PT to increase their independence and safety with mobility to allow discharge.           If plan is discharge home, recommend the following: A little help with bathing/dressing/bathroom;Assistance with cooking/housework;Help with stairs or ramp for entrance;A lot of help with walking and/or transfers   Can travel by private vehicle        Equipment Recommendations None recommended by PT  Recommendations for Other Services       Functional Status Assessment Patient has had a recent decline in their functional status and demonstrates the ability to make significant improvements in function in a reasonable and predictable amount of time.     Precautions / Restrictions Precautions Precautions: Fall Restrictions Weight Bearing Restrictions: No      Mobility  Bed Mobility Overal bed mobility: Needs Assistance Bed Mobility: Supine to Sit     Supine to sit: Mod assist, +2 for safety/equipment     General bed mobility comments: Pt able to move legs off bed and  scoot buttock toward edge.  He reports increased pain with attempts to log roll and push up, preferred HHA to pull straight up.  Mod A to pull up, assist of 2 for safety    Transfers Overall transfer level: Needs assistance Equipment used: Rolling walker (2 wheels) Transfers: Sit to/from Stand Sit to Stand: Min assist, From elevated surface                Ambulation/Gait Ambulation/Gait assistance: Contact guard assist Gait Distance (Feet): 70 Feet Assistive device: Rolling walker (2 wheels) Gait Pattern/deviations: Step-through pattern, Decreased stride length, Trunk flexed Gait velocity: decreased     General Gait Details: Min cues for RW proximity and posture  Stairs            Wheelchair Mobility     Tilt Bed    Modified Rankin (Stroke Patients Only)       Balance Overall balance assessment: Needs assistance Sitting-balance support: No upper extremity supported Sitting balance-Leahy Scale: Good     Standing balance support: Bilateral upper extremity supported, Reliant on assistive device for balance Standing balance-Leahy Scale: Poor Standing balance comment: Steady with RW                             Pertinent Vitals/Pain Pain Assessment Pain Assessment: 0-10 Pain Score: 8  Pain Location: mid to upper L back Pain Descriptors / Indicators: Grimacing, Guarding, Shooting, Sharp Pain Intervention(s): Limited  activity within patient's tolerance, Monitored during session, Premedicated before session, Repositioned, Heat applied    Home Living Family/patient expects to be discharged to:: Private residence Living Arrangements: Spouse/significant other Available Help at Discharge: Family;Available 24 hours/day Type of Home: House Home Access: Stairs to enter Entrance Stairs-Rails: Right Entrance Stairs-Number of Steps: 4   Home Layout: Two level;Able to live on main level with bedroom/bathroom Home Equipment: Rolling Walker (2  wheels);Rollator (4 wheels);Cane - single point;Shower seat - built in;Wheelchair - manual      Prior Function Prior Level of Function : Independent/Modified Independent             Mobility Comments: Using a RW currently, could ambulate in community ADLs Comments: Mod I at home with ADL.  Spouse assists as needed with iADL and community mobility given back pain that day.     Extremity/Trunk Assessment   Upper Extremity Assessment Upper Extremity Assessment: Defer to OT evaluation    Lower Extremity Assessment Lower Extremity Assessment: Generalized weakness (ROM WFL; MMT at least 3/5 but not further tested due to back pain)    Cervical / Trunk Assessment Cervical / Trunk Assessment: Kyphotic;Other exceptions Cervical / Trunk Exceptions: Very stiff with limited spinal ROM.  Forward head, thoracic kyphosis, and decreased lumbar lordosis  Communication      Cognition Arousal: Alert Behavior During Therapy: WFL for tasks assessed/performed Overall Cognitive Status: Within Functional Limits for tasks assessed                                          General Comments General comments (skin integrity, edema, etc.): Pt reports feels that he could move around bed easier with trapeze bar - PT obtained order from MD.  Pt reports his chronic back pain tends to be lower lumbar/S1, this pain is upper to mid L back (~T3-T10).  Pt does have increased kyphosis .  Attempted repeated extension - but pain unchanged and pt with limited motion. Attempted shoulder retraction and chin tucks but pain increased.  Pt did feel better in chair.  Reports heat packs help at home - provided with heat packs in pillow case.    Exercises     Assessment/Plan    PT Assessment Patient needs continued PT services  PT Problem List Decreased strength;Pain;Decreased mobility;Decreased knowledge of precautions;Decreased range of motion;Decreased activity tolerance;Decreased knowledge of use of  DME;Decreased balance       PT Treatment Interventions DME instruction;Therapeutic exercise;Gait training;Balance training;Stair training;Modalities;Functional mobility training;Therapeutic activities;Patient/family education    PT Goals (Current goals can be found in the Care Plan section)  Acute Rehab PT Goals Patient Stated Goal: decrease pain PT Goal Formulation: With patient/family Time For Goal Achievement: 08/25/23 Potential to Achieve Goals: Good    Frequency Min 1X/week     Co-evaluation               AM-PAC PT "6 Clicks" Mobility  Outcome Measure Help needed turning from your back to your side while in a flat bed without using bedrails?: A Little Help needed moving from lying on your back to sitting on the side of a flat bed without using bedrails?: A Lot Help needed moving to and from a bed to a chair (including a wheelchair)?: A Little Help needed standing up from a chair using your arms (e.g., wheelchair or bedside chair)?: A Little Help needed to walk in hospital room?: A  Little Help needed climbing 3-5 steps with a railing? : A Lot 6 Click Score: 16    End of Session Equipment Utilized During Treatment: Gait belt Activity Tolerance: Patient tolerated treatment well Patient left: in chair;with call bell/phone within reach;with family/visitor present (Pt following commands and reports will call for assist to get up.  Did not set alarm as pt needing to shift weight frequently for pain control and alarm would go off.) Nurse Communication: Mobility status PT Visit Diagnosis: Other abnormalities of gait and mobility (R26.89);Muscle weakness (generalized) (M62.81);Pain Pain - part of body:  (back)    Time: 6160-7371 PT Time Calculation (min) (ACUTE ONLY): 35 min   Charges:   PT Evaluation $PT Eval Low Complexity: 1 Low PT Treatments $Therapeutic Activity: 8-22 mins PT General Charges $$ ACUTE PT VISIT: 1 Visit         Anise Salvo, PT Acute Rehab  [facility] [phone]   Rayetta Humphrey 08/11/2023, 1:52 PM

## 2023-08-12 ENCOUNTER — Inpatient Hospital Stay (HOSPITAL_COMMUNITY): Payer: Medicare Other

## 2023-08-12 DIAGNOSIS — C787 Secondary malignant neoplasm of liver and intrahepatic bile duct: Secondary | ICD-10-CM | POA: Diagnosis not present

## 2023-08-12 DIAGNOSIS — R609 Edema, unspecified: Secondary | ICD-10-CM

## 2023-08-12 DIAGNOSIS — R531 Weakness: Secondary | ICD-10-CM | POA: Diagnosis not present

## 2023-08-12 DIAGNOSIS — Z7189 Other specified counseling: Secondary | ICD-10-CM

## 2023-08-12 DIAGNOSIS — I509 Heart failure, unspecified: Secondary | ICD-10-CM | POA: Diagnosis not present

## 2023-08-12 DIAGNOSIS — G893 Neoplasm related pain (acute) (chronic): Secondary | ICD-10-CM | POA: Diagnosis not present

## 2023-08-12 DIAGNOSIS — C3411 Malignant neoplasm of upper lobe, right bronchus or lung: Secondary | ICD-10-CM | POA: Diagnosis not present

## 2023-08-12 LAB — ECHOCARDIOGRAM COMPLETE
Height: 69 in
S' Lateral: 4.4 cm
Weight: 4528 [oz_av]

## 2023-08-12 LAB — BASIC METABOLIC PANEL
Anion gap: 12 (ref 5–15)
BUN: 13 mg/dL (ref 8–23)
CO2: 26 mmol/L (ref 22–32)
Calcium: 8.1 mg/dL — ABNORMAL LOW (ref 8.9–10.3)
Chloride: 97 mmol/L — ABNORMAL LOW (ref 98–111)
Creatinine, Ser: 1.04 mg/dL (ref 0.61–1.24)
GFR, Estimated: >60 mL/min (ref >60)
Glucose, Bld: 148 mg/dL — ABNORMAL HIGH (ref 70–99)
Potassium: 4.3 mmol/L (ref 3.5–5.1)
Sodium: 135 mmol/L (ref 135–145)

## 2023-08-12 LAB — CBC
HCT: 27 % — ABNORMAL LOW (ref 39.0–52.0)
Hemoglobin: 8.3 g/dL — ABNORMAL LOW (ref 13.0–17.0)
MCH: 30 pg (ref 26.0–34.0)
MCHC: 30.7 g/dL (ref 30.0–36.0)
MCV: 97.5 fL (ref 80.0–100.0)
Platelets: 151 10*3/uL (ref 150–400)
RBC: 2.77 MIL/uL — ABNORMAL LOW (ref 4.22–5.81)
RDW: 16 % — ABNORMAL HIGH (ref 11.5–15.5)
WBC: 5.2 10*3/uL (ref 4.0–10.5)
nRBC: 0 % (ref 0.0–0.2)

## 2023-08-12 LAB — GLUCOSE, CAPILLARY
Glucose-Capillary: 119 mg/dL — ABNORMAL HIGH (ref 70–99)
Glucose-Capillary: 175 mg/dL — ABNORMAL HIGH (ref 70–99)
Glucose-Capillary: 129 mg/dL — ABNORMAL HIGH (ref 70–99)
Glucose-Capillary: 132 mg/dL — ABNORMAL HIGH (ref 70–99)

## 2023-08-12 LAB — MAGNESIUM: Magnesium: 1.7 mg/dL (ref 1.7–2.4)

## 2023-08-12 MED ORDER — SODIUM CHLORIDE 0.9% FLUSH: 9.0000 mL | INTRAVENOUS | Status: DC | PRN

## 2023-08-12 MED ORDER — BIOTENE DRY MOUTH MT LIQD
15.0000 mL | Freq: Three times a day (TID) | OROMUCOSAL | Status: DC
  Administered 2023-08-12 – 2023-08-15 (×7): 15 mL via OROMUCOSAL

## 2023-08-12 MED ORDER — DIPHENHYDRAMINE HCL 12.5 MG/5ML PO ELIX: 12.5000 mg | ORAL_SOLUTION | Freq: Four times a day (QID) | ORAL | Status: DC | PRN

## 2023-08-12 MED ORDER — HYDROMORPHONE 1 MG/ML IV SOLN
INTRAVENOUS | Status: DC
  Administered 2023-08-13: 0.2 mg via INTRAVENOUS
  Administered 2023-08-13: 0.6 mg via INTRAVENOUS
  Administered 2023-08-13: 6 mg via INTRAVENOUS
  Administered 2023-08-14: 0.6 mg via INTRAVENOUS
  Administered 2023-08-14: 0.4 mg via INTRAVENOUS
  Administered 2023-08-14: 0.2 mg via INTRAVENOUS
  Filled 2023-08-12: qty 30

## 2023-08-12 MED ORDER — HYDROMORPHONE HCL 1 MG/ML IJ SOLN: 0.5000 mg | INTRAMUSCULAR | Status: DC | PRN

## 2023-08-12 MED ORDER — SENNA 8.6 MG PO TABS
2.0000 | ORAL_TABLET | Freq: Every day | ORAL | Status: DC
  Administered 2023-08-12 – 2023-08-14 (×3): 17.2 mg via ORAL
  Filled 2023-08-12 (×3): qty 2

## 2023-08-12 MED ORDER — GABAPENTIN 300 MG PO CAPS
300.0000 mg | ORAL_CAPSULE | Freq: Two times a day (BID) | ORAL | Status: DC
  Administered 2023-08-12 – 2023-08-15 (×6): 300 mg via ORAL
  Filled 2023-08-12 (×6): qty 1

## 2023-08-12 MED ORDER — DIPHENHYDRAMINE HCL 50 MG/ML IJ SOLN: 12.5000 mg | Freq: Four times a day (QID) | INTRAMUSCULAR | Status: DC | PRN

## 2023-08-12 MED ORDER — ACETAMINOPHEN 325 MG PO TABS
650.0000 mg | ORAL_TABLET | Freq: Three times a day (TID) | ORAL | Status: DC
  Administered 2023-08-12 – 2023-08-15 (×7): 650 mg via ORAL
  Filled 2023-08-12 (×8): qty 2

## 2023-08-12 MED ORDER — ONDANSETRON HCL 4 MG/2ML IJ SOLN: 4.0000 mg | Freq: Four times a day (QID) | INTRAMUSCULAR | Status: DC | PRN

## 2023-08-12 MED ORDER — NALOXONE HCL 0.4 MG/ML IJ SOLN: 0.4000 mg | INTRAMUSCULAR | Status: DC | PRN

## 2023-08-12 MED ORDER — PERFLUTREN LIPID MICROSPHERE
1.0000 mL | INTRAVENOUS | Status: AC | PRN
  Administered 2023-08-12: 7 mL via INTRAVENOUS

## 2023-08-12 NOTE — Progress Notes (Addendum)
   08/12/23 2026  BiPAP/CPAP/SIPAP  BiPAP/CPAP/SIPAP Pt Type Adult (pt declined assistance, comfortable with self-placement of home unit)  BiPAP/CPAP/SIPAP Resmed (HOME UNIT)  Mask Type Nasal mask  Heater Temperature  (noted humidifier already at max fill level)  Patient Home Equipment Yes (no frays on cord noted)  Auto Titrate Yes (8-20cm h2o)  Safety Check Completed by RT for Home Unit Yes, no issues noted   Machine plugged into red outlet.

## 2023-08-12 NOTE — Progress Notes (Signed)
  Echocardiogram 2D Echocardiogram has been performed.  Leda Roys RDCS 08/12/2023, 3:03 PM

## 2023-08-12 NOTE — Consult Note (Addendum)
Consultation Note Date: 08/12/2023   Patient Name: Adam Wise  DOB: 1951/08/19  MRN: [id number]  Age / Sex: 72 y.o., male  PCP: Doreene Nest, NP Referring Physician: Willeen Niece, MD  Reason for Consultation: pain control  HPI/Patient Profile: 72 y.o. 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 He has also had uncontrolled pain. There were some concerns of encephalopathy related to flexeril and gabapentin. Palliative medicine consulted for symptom management.    Primary Decision Maker PATIENT - if he is unable then he has HCPOA documented on file as Corrinne Eagle, Chryl Heck, Mendel Corning Clifton, (in that order)  Discussion: Chart reviewed including labs, progress notes, imaging from this and previous encounters.  Met with patient and his spouse arrived during our discussion.  Introduced Palliative medicine- palliative medicine is specialized medical care for people living with serious illness. It focuses on providing relief from the symptoms and stress of a serious illness. The goal is to improve quality of life for both the patient and the family. Patient and his spouse moved here from [place]. 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 Prior to admission was independent with ADL's, but about a week prior to admission started having decreased po intake- reports nothing tastes good and he can't smell any of the food.  He complains of pain in his R upper abdomen that radiates around his back along the line of his ribs. He also has chronic low back pain due to degenerative joint disease. Pain has been worsening over the last weeks. Pain medications have become less effective over time. Heat therapy has been  helpful. His pain medication was increased last week to MSER 30mg  TID but he did not feel it helped. Has been on gabapentin 300mg  BID for several years.  He was to see Palliative at [facility] today, but unable due to hospitalization.  We discussed his priorities and goals- his main goal is to spend time with his spouse. He turned down a trial at [facility] due to it would require him to travel 5 days a week for treatments and that would take too much time for him.  He understands he has an end stage illness. He notes that "I understand where I'm heading"- when asked for clarification he says "underground". He was a bit tearful during our discussion. He feels that he is close to the end. He was visited by [facility] last week. He has spoken with his clergy.  He is not yet ready for complete comfort focus care and stopping treatment for his cancer but says he is close to being there.  We discussed changing current pain regimen to dilaudid PCA for 24 hours to get a clear picture of his 24 hr opioid requirement. Then can transition to PO meds or patch after information obtained.  He typically has BM's every 4-5  days. LBM was about 5 days ago. We discussed the importance of good bowel regimen due to opioid medication.    SUMMARY OF RECOMMENDATIONS -D/C current po opioids -Based on 24hr OME requirements prior to admission will start hydromorphone PCA at 0.2mg  q10 mins prn IV -Senna 2 po at bedtime for bowel prophylaxis -Acetaminophen 650mg  TID for adjuvant to opioid -Aquatherapy heat pad  -Biotene TID for dry mouth/chemo related taste changes -Dietician consult for education related to increasing po intake for chemo related taste changes -Would likely benefit from low dose decadron for capsular pain, however will avoid for now due to patient on immunotherapy and per his request due to previous steroid treatment caused hyperglycemia    Code Status/Advance Care Planning: Full code - he  has HCPOA and living will on chart- I did not discuss code status today- focused on pain control- will discuss at later time when pain is better controlled   Prognosis:   Unable to determine  Discharge Planning: To Be Determined  Primary Diagnoses: Present on Admission:  Chronic fatigue  Chronic respiratory failure with hypoxia (HCC)  Essential hypertension  Hyperlipidemia associated with type 2 diabetes mellitus (HCC)  History of pulmonary embolism  OSA (obstructive sleep apnea)  Small cell lung cancer, right upper lobe (HCC)  Cancer related pain   Review of Systems  Constitutional:  Positive for activity change, appetite change and fatigue.  Musculoskeletal:  Positive for back pain.  Psychiatric/Behavioral:  Negative for confusion.     Physical Exam Constitutional:      General: He is not in acute distress. Cardiovascular:     Rate and Rhythm: Normal rate.  Pulmonary:     Effort: Pulmonary effort is normal.  Abdominal:     Tenderness: There is abdominal tenderness.     Comments: RUQ tenderness  Skin:    General: Skin is warm and dry.  Neurological:     Mental Status: He is alert and oriented to person, place, and time.     Vital Signs: BP (!) 151/128 (BP Location: Right Arm)   Pulse (!) 104   Temp 98.7 F (37.1 C) (Oral)   Resp 15   Ht 5\' 9"  (1.753 m)   Wt 128.4 kg   SpO2 97%   BMI 41.79 kg/m  Pain Scale: 0-10 POSS *See Group Information*: 1-Acceptable,Awake and alert Pain Score: 2    SpO2: SpO2: 97 % O2 Device:SpO2: 97 % O2 Flow Rate: .O2 Flow Rate (L/min): 2 L/min  IO: Intake/output summary:  Intake/Output Summary (Last 24 hours) at 08/12/2023 1340 Last data filed at 08/12/2023 1114 Gross per 24 hour  Intake 1184.19 ml  Output 1225 ml  Net -40.81 ml    LBM: Last BM Date : 08/08/23 Baseline Weight: Weight: 128.4 kg Most recent weight: Weight: 128.4 kg       Thank you for this consult. Palliative medicine will continue to follow and assist  as needed.  Time Total: 120 minutes Greater than 50%  of this time was spent counseling and coordinating care related to the above assessment and plan.  Signed by: Ocie Bob, AGNP-C Palliative Medicine    Please contact Palliative Medicine Team phone at [phone] for questions and concerns.  For individual provider: See Loretha Stapler

## 2023-08-12 NOTE — Progress Notes (Signed)
Pt Physical Therapy Treatment Patient Details Name: Adam Wise MRN: [id number] DOB: [birth year] Today's Date: 08/12/2023   History of Present Illness Pt is 72 yo male admitted on 08/10/23 with progressive weakness and back pain.  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  Other hx includes but not limited to chronic back pain, COPD, HTN, HLD, OSA on CPAP, DM2    PT Comments  General Comments: AxO x 3 pleasant and motivated.  OOB in recliner.  Spouse in room.  Assisted with amb 1/2 Nursing Unit.  General transfer comment: slight unsteady using hands to steady self and momentum to rise due to BMI.  General Gait Details: Min cues for RW proximity and posture.  Light lean/support on walker.  Amb RA avg sats 96% with 2/4 dyspnea. Pt plans to return home with Spouse when medically stable.     If plan is discharge home, recommend the following: A little help with bathing/dressing/bathroom;Assistance with cooking/housework;Help with stairs or ramp for entrance;A lot of help with walking and/or transfers   Can travel by private vehicle        Equipment Recommendations  None recommended by PT    Recommendations for Other Services       Precautions / Restrictions Precautions Precautions: Fall Restrictions Weight Bearing Restrictions: No     Mobility  Bed Mobility               General bed mobility comments: OOB in recliner    Transfers Overall transfer level: Needs assistance Equipment used: Rolling walker (2 wheels) Transfers: Sit to/from Stand Sit to Stand: Supervision, Contact guard assist           General transfer comment: slight unsteady using hands to steady self and momentum to rise due to BMI    Ambulation/Gait Ambulation/Gait assistance: Contact guard assist, Supervision Gait Distance (Feet): 400 Feet Assistive device: Rolling walker (2 wheels) Gait Pattern/deviations: Step-through pattern, Decreased stride length, Trunk flexed Gait  velocity: decreased     General Gait Details: Min cues for RW proximity and posture.  Light lean/support on walker.  Amb RA avg sats 96% with 2/4 dyspnea.   Stairs             Wheelchair Mobility     Tilt Bed    Modified Rankin (Stroke Patients Only)       Balance                                            Cognition Arousal: Alert Behavior During Therapy: WFL for tasks assessed/performed Overall Cognitive Status: Within Functional Limits for tasks assessed                                 General Comments: AxO x 3 pleasant and motivated        Exercises      General Comments        Pertinent Vitals/Pain Pain Assessment Pain Assessment: Faces Faces Pain Scale: Hurts a little bit Pain Location: mid to upper L back "comes and goes" Pain Descriptors / Indicators: Grimacing, Guarding Pain Intervention(s): Monitored during session, Repositioned    Home Living                          Prior Function  PT Goals (current goals can now be found in the care plan section) Progress towards PT goals: Progressing toward goals    Frequency    Min 1X/week      PT Plan      Co-evaluation              AM-PAC PT "6 Clicks" Mobility   Outcome Measure  Help needed turning from your back to your side while in a flat bed without using bedrails?: A Little Help needed moving from lying on your back to sitting on the side of a flat bed without using bedrails?: A Little Help needed moving to and from a bed to a chair (including a wheelchair)?: A Little Help needed standing up from a chair using your arms (e.g., wheelchair or bedside chair)?: A Little Help needed to walk in hospital room?: A Little Help needed climbing 3-5 steps with a railing? : A Little 6 Click Score: 18    End of Session Equipment Utilized During Treatment: Gait belt Activity Tolerance: Patient tolerated treatment well Patient left:  in chair;with call bell/phone within reach;with family/visitor present Nurse Communication: Mobility status PT Visit Diagnosis: Other abnormalities of gait and mobility (R26.89);Muscle weakness (generalized) (M62.81);Pain     Time: 1345-1400 PT Time Calculation (min) (ACUTE ONLY): 15 min  Charges:    $Gait Training: 8-22 mins PT General Charges $$ ACUTE PT VISIT: 1 Visit                     Felecia Shelling  PTA Acute  Rehabilitation Services Office M-F          [phone]

## 2023-08-12 NOTE — Progress Notes (Signed)
PROGRESS NOTE    Adam Wise  IRJ:[id number] DOB: Apr 08, 1951 DOA: 08/10/2023 PCP: Doreene Nest, NP   Brief Narrative: This 72 yrs old Male with PMH significant of SCLC, on palliative chemotherapy, chronic back pain, COPD, HTN, HLD, OSA on CPAP, type II DM, obesity presented to the hospital with complaints of back pain and generalized weakness. He had poor p.o. intake for last few days as well. He went to the to the bathroom, reportedly had a fall but unable to stand up. Currently being treated for AKI, hypomagnesemia, hypokalemia, acute on chronic cancer related back pain and toxic encephalopathy secondary to Flexeril and gabapentin.  Assessment & Plan:   Principal Problem:   General weakness Active Problems:   Essential hypertension   Type 2 diabetes mellitus (HCC)   OSA (obstructive sleep apnea)   Hyperlipidemia associated with type 2 diabetes mellitus (HCC)   Small cell lung cancer, right upper lobe (HCC)   Chronic fatigue   History of pulmonary embolism   Chronic respiratory failure with hypoxia (HCC)   Weakness   Cancer related pain  Acute kidney injury: Now resolved with IV hydration.  Hypokalemia /Hypomagnesemia: Resolved with IV hydration and replacement.  Generalized weakness and deconditioning: In the setting of hypotension. No blood pressure is improved. PT and OT evaluation  Essential hypertension: Continue Norvasc 10 mg as well as losartan. Blood pressure actually soft. Currently holding both medication.   Acute toxic encephalopathy: Patient became confused after receiving morphine, Flexeril and gabapentin. Discontinue Flexeril and gabapentin and monitor. ABG unremarkable.  No focal deficit.  Mild gastritis is seen. He seems back to his baseline mental status.   Midthoracic and surrounding pain. MRI no evidence of metastatic disease in thoracic region. Degenerative disc disease most severe at T8-9 and 10 , no abnormal cord signal. Degenerative  disc disease from C3-C4 and C6-C7 bilateral foraminal stenosis   Small cell lung cancer : Patient is getting  tarlatamab at [facility]. Per wife original plan was to transfer the patient to [facility] from ED. Currently no indication for higher level care. For now we will monitor closely. Discussed with Dr. Arbutus Ped.   Obesity; Placing the patient at high risk for poor outcome. Diet and exercise discussed in detail.   Chronic pain syndrome: Continue MS Contin. Patient takes MS IR 30 mg 3 times a day on a scheduled basis. Monitor for now. Palliative care consulted to get comfortable pain control.   DVT prophylaxis: Eliquis Code Status: Full code Family Communication: No family at bed side. Disposition Plan:    Status is: Inpatient Remains inpatient appropriate because: Admitted For multiple medical issues, now improving.   Consultants:  None  Procedures:None  Antimicrobials:  Anti-infectives (From admission, onward)    None      Subjective: Patient was seen and examined at bedside.  Overnight events noted.   Patient states pain is not controlled. Patient reports he wants to follow-up with his oncologist.  Objective: Vitals:   08/11/23 1600 08/11/23 2208 08/12/23 0621 08/12/23 0946  BP: 135/83 106/76 117/75 113/72  Pulse: (!) 105 (!) 106 90 (!) 103  Resp: 18  16 18   Temp: 99.7 F (37.6 C) 98.2 F (36.8 C) 97.9 F (36.6 C) 98.9 F (37.2 C)  TempSrc: Oral Oral Oral Oral  SpO2: 94% 97% 98% 98%  Weight:      Height:        Intake/Output Summary (Last 24 hours) at 08/12/2023 1233 Last data filed at 08/12/2023 1114 Gross per 24 hour  Intake 1184.19 ml  Output 1225 ml  Net -40.81 ml   Filed Weights   08/10/23 1659  Weight: 128.4 kg    Examination:  General exam: Appears calm and comfortable, deconditioned, not in any acute distress. Respiratory system: Clear to auscultation. Respiratory effort normal.  RR 16 Cardiovascular system: S1 & S2 heard, RRR. No JVD,  murmurs, rubs, gallops or clicks. No pedal edema. Gastrointestinal system: Abdomen is non distended, soft and non tender.  Normal bowel sounds heard. Back: tenderness noted in the thoracic and lumbar back Central nervous system: Alert and oriented x 3. No focal neurological deficits. Extremities: No edema, no cyanosis, no clubbing Skin: No rashes, lesions or ulcers Psychiatry: Judgement and insight appear normal. Mood & affect appropriate.     Data Reviewed: I have personally reviewed following labs and imaging studies  CBC: Recent Labs  Lab 08/10/23 1708 08/11/23 0347 08/12/23 0353  WBC 7.6 5.4 5.2  NEUTROABS 6.5 4.4  --   HGB 9.6* 8.6* 8.3*  HCT 31.3* 28.2* 27.0*  MCV 96.9 97.2 97.5  PLT 178 134* 151   Basic Metabolic Panel: Recent Labs  Lab 08/10/23 1708 08/11/23 0347 08/11/23 1709 08/12/23 0353  NA 131* 131* 132* 135  K 4.1 3.4* 4.0 4.3  CL 94* 97* 96* 97*  CO2 25 27 26 26   GLUCOSE 177* 123* 132* 148*  BUN 20 16 13 13   CREATININE 1.40* 1.18 0.97 1.04  CALCIUM 7.7* 7.3* 7.8* 8.1*  MG 1.0* 1.3* 1.6* 1.7   GFR: Estimated Creatinine Clearance: 83.9 mL/min (by C-G formula based on SCr of 1.04 mg/dL). Liver Function Tests: Recent Labs  Lab 08/10/23 1708  AST 64*  ALT 25  ALKPHOS 63  BILITOT 0.6  PROT 6.1*  ALBUMIN 2.8*   Recent Labs  Lab 08/10/23 1708  LIPASE 20   No results for input(s): "AMMONIA" in the last 168 hours. Coagulation Profile: No results for input(s): "INR", "PROTIME" in the last 168 hours. Cardiac Enzymes: No results for input(s): "CKTOTAL", "CKMB", "CKMBINDEX", "TROPONINI" in the last 168 hours. BNP (last 3 results) No results for input(s): "PROBNP" in the last 8760 hours. HbA1C: No results for input(s): "HGBA1C" in the last 72 hours. CBG: Recent Labs  Lab 08/11/23 1201 08/11/23 1651 08/11/23 2142 08/12/23 0740 08/12/23 1203  GLUCAP 155* 137* 116* 119* 175*   Lipid Profile: No results for input(s): "CHOL", "HDL",  "LDLCALC", "TRIG", "CHOLHDL", "LDLDIRECT" in the last 72 hours. Thyroid Function Tests: No results for input(s): "TSH", "T4TOTAL", "FREET4", "T3FREE", "THYROIDAB" in the last 72 hours. Anemia Panel: No results for input(s): "VITAMINB12", "FOLATE", "FERRITIN", "TIBC", "IRON", "RETICCTPCT" in the last 72 hours. Sepsis Labs: No results for input(s): "PROCALCITON", "LATICACIDVEN" in the last 168 hours.  No results found for this or any previous visit (from the past 240 hour(s)).   Radiology Studies: VAS [place] LOWER EXTREMITY VENOUS (DVT)  Result Date: 08/12/2023  Lower Venous DVT Study Patient Name:  Adam Wise  Date of Exam:   08/12/2023 Medical Rec #: [id number]     Accession #:    [id number] Date of Birth: May 01, 1951     Patient Gender: M Patient Age:   20 years Exam Location:  [facility] Procedure:      VAS [place] LOWER EXTREMITY VENOUS (DVT) Referring Phys: PRANAV PATEL --------------------------------------------------------------------------------  Indications: Edema.  Risk Factors: Cancer. Limitations: Poor ultrasound/tissue interface and body habitus. Comparison Study: No prior studies. Performing Technologist: Chanda Busing RVT  Examination Guidelines: A complete evaluation includes B-mode imaging,  spectral Doppler, color Doppler, and power Doppler as needed of all accessible portions of each vessel. Bilateral testing is considered an integral part of a complete examination. Limited examinations for reoccurring indications may be performed as noted. The reflux portion of the exam is performed with the patient in reverse Trendelenburg.  +---------+---------------+---------+-----------+----------+--------------+ RIGHT    CompressibilityPhasicitySpontaneityPropertiesThrombus Aging +---------+---------------+---------+-----------+----------+--------------+ CFV      Full           Yes      Yes                                  +---------+---------------+---------+-----------+----------+--------------+ SFJ      Full                                                        +---------+---------------+---------+-----------+----------+--------------+ FV Prox  Full                                                        +---------+---------------+---------+-----------+----------+--------------+ FV Mid   Full                                                        +---------+---------------+---------+-----------+----------+--------------+ FV Distal               Yes      Yes                                 +---------+---------------+---------+-----------+----------+--------------+ PFV      Full                                                        +---------+---------------+---------+-----------+----------+--------------+ POP      Full           Yes      Yes                                 +---------+---------------+---------+-----------+----------+--------------+ PTV      Full                                                        +---------+---------------+---------+-----------+----------+--------------+ PERO     Full                                                        +---------+---------------+---------+-----------+----------+--------------+   +---------+---------------+---------+-----------+----------+--------------+  LEFT     CompressibilityPhasicitySpontaneityPropertiesThrombus Aging +---------+---------------+---------+-----------+----------+--------------+ CFV      Full           Yes      Yes                                 +---------+---------------+---------+-----------+----------+--------------+ SFJ      Full                                                        +---------+---------------+---------+-----------+----------+--------------+ FV Prox  Full                                                         +---------+---------------+---------+-----------+----------+--------------+ FV Mid   Full                                                        +---------+---------------+---------+-----------+----------+--------------+ FV DistalFull                                                        +---------+---------------+---------+-----------+----------+--------------+ PFV      Full                                                        +---------+---------------+---------+-----------+----------+--------------+ POP      Full           Yes      Yes                                 +---------+---------------+---------+-----------+----------+--------------+ PTV      Full                                                        +---------+---------------+---------+-----------+----------+--------------+ PERO     Full                                                        +---------+---------------+---------+-----------+----------+--------------+    Summary: RIGHT: - There is no evidence of deep vein thrombosis in the lower extremity. However, portions of this examination were limited- see technologist comments above.  - No cystic structure found in the popliteal fossa.  LEFT: - There is no evidence of  deep vein thrombosis in the lower extremity. However, portions of this examination were limited- see technologist comments above.  - No cystic structure found in the popliteal fossa.  *See table(s) above for measurements and observations.    Preliminary    MR THORACIC SPINE WO CONTRAST  Result Date: 08/11/2023 CLINICAL DATA:  Metastatic small cell lung cancer. EXAM: MRI THORACIC SPINE WITHOUT CONTRAST TECHNIQUE: Multiplanar, multisequence MR imaging of the thoracic spine was performed. No intravenous contrast was administered. COMPARISON:  CT 05/19/2023 FINDINGS: Alignment:  Mild lower thoracic kyphotic curvature. Vertebrae: No definable metastatic disease in the region. No regional  fracture. Degenerative type marrow edema from T4-T11 could relate to regional back pain. Cord: Canal stenosis at T8-9 and T9-10 as described below. Thoracic cord appears normal at the other levels. Paraspinal and other soft tissues: Negative Disc levels: Non-compressive degenerative disc disease and degenerative facet disease from T1-2 through T7-8. Findings could relate to back pain. T8-9: Endplate osteophytes and shallow protrusion of the disc. Fluid intensity material in the disc space, likely degenerative. Bilateral facet arthropathy with joint effusions and hypertrophic change. Spinal stenosis with effacement of the subarachnoid space surrounding cord. Slight indentation of the cord. T9-10: Endplate osteophytes and bulging of the disc. Bilateral facet arthropathy with joint effusions and hypertrophy. Similar findings of spinal stenosis with effacement of the subarachnoid space surrounding the cord and slight indentation of the cord. T10-11 through T12-L1: Mild non-compressive degenerative changes. IMPRESSION: 1. No evidence of metastatic disease in the thoracic region. 2. Degenerative disc disease and degenerative facet disease throughout the thoracic region, most severe at T8-9 and T9-10 where there is spinal stenosis with effacement of the subarachnoid space surrounding the cord and slight indentation of the cord. No abnormal cord signal. The findings could certainly relate to back pain. 3. Degenerative type marrow edema from T4-T11 which could relate to regional back pain. Electronically Signed   By: Paulina Fusi M.D.   On: 08/11/2023 19:28   MR CERVICAL SPINE WO CONTRAST  Result Date: 08/11/2023 CLINICAL DATA:  Metastatic disease evaluation. Lung cancer, metastatic small cell. EXAM: MRI CERVICAL SPINE WITHOUT CONTRAST TECHNIQUE: Multiplanar, multisequence MR imaging of the cervical spine was performed. No intravenous contrast was administered. COMPARISON:  None Available. FINDINGS: Alignment:  Straightening of the normal cervical lordosis. 2 mm degenerative anterolisthesis at C7-T1. Vertebrae: No fracture or focal bone lesion. Cord: No cord compression or focal cord lesion. Posterior Fossa, vertebral arteries, paraspinal tissues: Negative Disc levels: The foramen magnum is widely patent. There is ordinary mild osteoarthritis of the C1-2 articulation but no encroachment upon the neural structures. C2-3: Left-sided uncovertebral prominence. Mild left foraminal narrowing. C3-4: Endplate osteophytes and bulging of the disc. Bilateral facet hypertrophy. Narrowing of the canal with AP diameter in the midline 9.7 mm. No cord compression. Bilateral foraminal stenosis left worse than right. Either C4 nerve could be compressed, more likely the left. C4-5: Endplate osteophytes and bulging of the disc. Uncovertebral hypertrophy worse on the left. Left-sided facet degeneration. Bilateral foraminal stenosis, worse on the left. Either C5 nerve could be affected, more likely the left. C5-6: Endplate osteophytes and bulging of the disc. Bilateral foraminal stenosis could affect either C6 nerve. C6-7: Endplate osteophytes and bulging of the disc. Bilateral foraminal stenosis could affect either C7 nerve. C7-T1: Facet osteoarthritis with 2 mm of anterolisthesis. No compressive narrowing of the canal or foramina. IMPRESSION: 1. No evidence of metastatic disease. 2. Degenerative spondylosis from C3-4 through C6-7. No compressive canal stenosis. Bilateral foraminal stenosis that  could cause neural compression from C3-4 through C6-7. 3. Facet osteoarthritis at C7-T1 with 2 mm of anterolisthesis but no compressive stenosis. Electronically Signed   By: Paulina Fusi M.D.   On: 08/11/2023 19:24   DG Chest Port 1 View  Result Date: 08/10/2023 CLINICAL DATA:  History of lung cancer. EXAM: PORTABLE CHEST 1 VIEW COMPARISON:  Chest radiograph 09/18/2022, CT chest 05/19/2023 FINDINGS: The right chest wall port is stable in position.  The cardiomediastinal silhouette is grossly stable, with unchanged cardiomegaly. Confluent opacity in the right hilar/suprahilar region is unchanged compared to the prior radiograph. There is no new or worsening focal airspace disease. Blunting of the right costophrenic angle is consistent with a small pleural effusion as seen on prior CT. There is no significant left effusion. There is no pneumothorax There is no acute osseous abnormality. IMPRESSION: 1. Confluent opacity in the right hilar/suprahilar region is unchanged, again most consistent with radiation change. 2. Blunting of the right costophrenic angle consistent with a small pleural effusion as seen on prior CT. 3. No new acute airspace opacity. Electronically Signed   By: Lesia Hausen M.D.   On: 08/10/2023 18:37    Scheduled Meds:  apixaban  5 mg Oral BID   docusate sodium  100 mg Oral BID   insulin aspart  0-15 Units Subcutaneous TID WC   insulin aspart  0-5 Units Subcutaneous QHS   lidocaine  2 patch Transdermal Q24H   pantoprazole  40 mg Oral Daily   pravastatin  40 mg Oral q1800   senna-docusate  1 tablet Oral BID   Continuous Infusions:   LOS: 1 day    Time spent: 50 mins    Willeen Niece, MD [facility]   If 7PM-7AM, please contact night-coverage

## 2023-08-12 NOTE — Plan of Care (Signed)

## 2023-08-12 NOTE — Plan of Care (Signed)
  Problem: Clinical Measurements: Goal: Ability to maintain clinical measurements within normal limits will improve Outcome: Progressing   Problem: Nutrition: Goal: Adequate nutrition will be maintained Outcome: Progressing   Problem: Pain Managment: Goal: General experience of comfort will improve Outcome: Progressing   Problem: Safety: Goal: Ability to remain free from injury will improve Outcome: Progressing   

## 2023-08-12 NOTE — Progress Notes (Signed)
Bilateral lower extremity venous duplex has been completed. Preliminary results can be found in CV Proc through chart review.   08/12/23 10:12 AM Olen Cordial RVT

## 2023-08-13 ENCOUNTER — Ambulatory Visit: Payer: Medicare Other

## 2023-08-13 ENCOUNTER — Telehealth: Payer: Self-pay | Admitting: Primary Care

## 2023-08-13 DIAGNOSIS — C3411 Malignant neoplasm of upper lobe, right bronchus or lung: Secondary | ICD-10-CM | POA: Diagnosis not present

## 2023-08-13 DIAGNOSIS — G893 Neoplasm related pain (acute) (chronic): Secondary | ICD-10-CM | POA: Diagnosis not present

## 2023-08-13 DIAGNOSIS — C787 Secondary malignant neoplasm of liver and intrahepatic bile duct: Secondary | ICD-10-CM | POA: Diagnosis not present

## 2023-08-13 DIAGNOSIS — R531 Weakness: Secondary | ICD-10-CM | POA: Diagnosis not present

## 2023-08-13 LAB — BASIC METABOLIC PANEL
Anion gap: 11 (ref 5–15)
BUN: 11 mg/dL (ref 8–23)
CO2: 25 mmol/L (ref 22–32)
Calcium: 8 mg/dL — ABNORMAL LOW (ref 8.9–10.3)
Chloride: 96 mmol/L — ABNORMAL LOW (ref 98–111)
Creatinine, Ser: 1.01 mg/dL (ref 0.61–1.24)
GFR, Estimated: >60 mL/min (ref >60)
Glucose, Bld: 129 mg/dL — ABNORMAL HIGH (ref 70–99)
Potassium: 3.6 mmol/L (ref 3.5–5.1)
Sodium: 132 mmol/L — ABNORMAL LOW (ref 135–145)

## 2023-08-13 LAB — CBC
HCT: 28.5 % — ABNORMAL LOW (ref 39.0–52.0)
Hemoglobin: 8.8 g/dL — ABNORMAL LOW (ref 13.0–17.0)
MCH: 29.8 pg (ref 26.0–34.0)
MCHC: 30.9 g/dL (ref 30.0–36.0)
MCV: 96.6 fL (ref 80.0–100.0)
Platelets: 141 K/uL — ABNORMAL LOW (ref 150–400)
RBC: 2.95 MIL/uL — ABNORMAL LOW (ref 4.22–5.81)
RDW: 15.7 % — ABNORMAL HIGH (ref 11.5–15.5)
WBC: 3.6 K/uL — ABNORMAL LOW (ref 4.0–10.5)
nRBC: 0 % (ref 0.0–0.2)

## 2023-08-13 LAB — GLUCOSE, CAPILLARY
Glucose-Capillary: 133 mg/dL — ABNORMAL HIGH (ref 70–99)
Glucose-Capillary: 160 mg/dL — ABNORMAL HIGH (ref 70–99)
Glucose-Capillary: 117 mg/dL — ABNORMAL HIGH (ref 70–99)
Glucose-Capillary: 162 mg/dL — ABNORMAL HIGH (ref 70–99)

## 2023-08-13 LAB — MAGNESIUM: Magnesium: 1.4 mg/dL — ABNORMAL LOW (ref 1.7–2.4)

## 2023-08-13 MED ORDER — MAGNESIUM SULFATE 2 GM/50ML IV SOLN
2.0000 g | Freq: Once | INTRAVENOUS | Status: AC
  Administered 2023-08-13: 2 g via INTRAVENOUS
  Filled 2023-08-13: qty 50

## 2023-08-13 MED ORDER — METOPROLOL TARTRATE 12.5 MG HALF TABLET
12.5000 mg | ORAL_TABLET | Freq: Two times a day (BID) | ORAL | Status: DC
  Administered 2023-08-13 – 2023-08-15 (×5): 12.5 mg via ORAL
  Filled 2023-08-13 (×5): qty 1

## 2023-08-13 NOTE — Plan of Care (Signed)
Nutrition Education Note  RD consulted for nutrition education regarding taste changes related to chemotherapy.  RD provided "Taste and Smell Changes" handout from the Academy of [employer] and Dietetics and reviewed.Teach back method used. Patient reports typically eating 2 meals a day at home. Wasn't eating well 1 week PTA due to feeling poorly but reports he is now eating well. States it is hard for him to pinpoint when the taste changes started due to changing medications and chemotherapy a lot. Notes the taste changes are a little better now.   Expect good compliance.  Current diet order is Regular, patient is consuming approximately 0-100% of meals at this time. Labs and medications reviewed. No further nutrition interventions warranted at this time. RD contact information provided. If additional nutrition issues arise, please re-consult RD.  Shelle Iron RD, LDN For contact information, refer to [facility].

## 2023-08-13 NOTE — Progress Notes (Signed)
Mobility Specialist - Progress Note   08/13/23 0930  Mobility  Activity Ambulated with assistance in hallway  Level of Assistance Standby assist, set-up cues, supervision of patient - no hands on  Assistive Device Front wheel walker  Distance Ambulated (ft) 240 ft  Activity Response Tolerated well  Mobility Referral Yes  $Mobility charge 1 Mobility  Mobility Specialist Start Time (ACUTE ONLY) 0911  Mobility Specialist Stop Time (ACUTE ONLY) 0930  Mobility Specialist Time Calculation (min) (ACUTE ONLY) 19 min   Pt received in bed and agreeable to mobility. No complaints during session. Pt to recliner for breakfast after session with all needs met.    Pre-mobility:  104 HR During mobility: 121 HR Post-mobility: 119 HR  [facility] Paediatric nurse

## 2023-08-13 NOTE — Progress Notes (Signed)
Daily Progress Note   Patient Name: Adam Wise       Date: 08/13/2023 DOB: [birth year]  Age: 72 y.o. MRN#: [id number] Attending Physician: Willeen Niece, MD Primary Care Physician: Doreene Nest, NP Admit Date: 08/10/2023  Reason for Consultation/Follow-up: Pain control  Patient Profile/HPI:   72 y.o. 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 He has also had uncontrolled pain. There were some concerns of encephalopathy related to flexeril and gabapentin. Palliative medicine consulted for symptom management.      Subjective: Chart reviewed including labs, progress notes, imaging from this and previous encounters.  Met with patient, his daughter [name] and his pastor Baxter Hire. Mr. Seckinger reports having good pain control with IV dilaudid. Chart review shows he has used 1.1 total mg over the last 24 hours. He notes he was at first reluctant to use the pain medication, but is feeling more comfortable with it now that he knows how it makes him feel.  He has not yet had a BM.  We discussed his GOC. He is hopeful to continue treatments for his cancer. He would like to discuss his options with Dr. Janann August his doctor at [facility].  We discussed that if he either chose not to, or wasn't eligible for further chemo treatments then Hospice services would be appropriate. Hospice philosophy of care and services were reviewed. Questions answered.  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  For now he wishes to be full code. I encouraged him to continue to consider and  to discuss with his family- what would be his GOC of going through resuscitation efforts.      Physical Exam Vitals and nursing note reviewed.  Constitutional:      Appearance: He is not ill-appearing.  Pulmonary:     Effort: Pulmonary effort is normal.  Neurological:     Mental Status: He is alert and oriented to person, place, and time.  Psychiatric:     Comments: In good spirits             Vital Signs: BP (!) 121/92 (BP Location: Right Arm)   Pulse  92   Temp 98 F (36.7 C) (Oral)   Resp 20   Ht 5\' 9"  (1.753 m)   Wt 128.4 kg   SpO2 98%   BMI 41.79 kg/m  SpO2: SpO2: 98 % O2 Device: O2 Device: Nasal Cannula O2 Flow Rate: O2 Flow Rate (L/min): 0 L/min  Intake/output summary:  Intake/Output Summary (Last 24 hours) at 08/13/2023 1556 Last data filed at 08/13/2023 1004 Gross per 24 hour  Intake 900 ml  Output 1500 ml  Net -600 ml   LBM: Last BM Date : 08/08/23 Baseline Weight: Weight: 128.4 kg Most recent weight: Weight: 128.4 kg       Palliative Assessment/Data: PPS: 60%      Patient Active Problem List   Diagnosis Date Noted   Cancer related pain 08/11/2023   Weakness 08/10/2023   General weakness 08/10/2023   Dizziness 11/12/2022   Chronic respiratory failure with hypoxia (HCC) 09/18/2022   Pneumothorax on right 09/01/2022   Recurrent pleural effusion 08/27/2022   Recurrent pleural effusion on right 01/16/2022   Preop cardiovascular exam 12/19/2021   Coronary Calcium Score 657 11/13/2021   Atypical chest pain 11/13/2021   Recurrent right pleural effusion 08/08/2021   Erectile dysfunction 10/26/2020   Port-A-Cath in place 06/11/2020   History of pulmonary embolism 05/15/2020   Small cell lung cancer, right upper lobe (HCC) 04/12/2020   Chronic fatigue 04/12/2020   Encounter for antineoplastic chemotherapy 04/12/2020   Encounter for antineoplastic immunotherapy 04/12/2020   Malignant neoplasm of upper lobe of right lung (HCC) 04/04/2020   Goals of  care, counseling/discussion 03/30/2020   Exertional dyspnea 03/26/2020   Hyperlipidemia associated with type 2 diabetes mellitus (HCC) 11/08/2019   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   Essential hypertension 09/09/2017   Type 2 diabetes mellitus (HCC) 09/09/2017   GERD (gastroesophageal reflux disease) 09/09/2017   OSA (obstructive sleep apnea) 09/09/2017   Chronic bilateral low back pain without sciatica 09/09/2017   H/O knee surgery 04/27/2014   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   Thank you for allowing the Palliative Medicine Team to assist in the care of this patient.  Greater than 50%  of this time was spent counseling and coordinating care related to the above assessment and plan.  Ocie Bob, AGNP-C Palliative Medicine   Please contact Palliative Medicine Team phone at [phone] for questions and concerns.

## 2023-08-13 NOTE — Telephone Encounter (Signed)
Patients wife called to let Jae Dire know that patient is currently in the hospital at [facility] long and she would just like Jae Dire to take a look in his chart,she said that they are trying to switch around his medications or something.

## 2023-08-13 NOTE — TOC Initial Note (Signed)
Transition of Care [facility]) - Initial/Assessment Note   Patient Details  Name: Adam Wise MRN: [id number] Date of Birth: 1951/10/20  [facility] Care [facility]) CM/SW Contact:    Ewing Schlein, LCSW Phone Number: 08/13/2023, 2:00 PM  Clinical Narrative: PT evaluation recommended HH. CSW spoke with patient and patient is agreeable to HHPT/OT referral. CSW made referral to [facility] with [facility], which was accepted. CSW updated patient's daughter, [name]. HH agency added to AVS. Hospitalist placed HH orders.              Expected Discharge Plan: Home w Home Health Services Barriers to Discharge: Continued Medical Work up  Patient Goals and CMS Choice Patient states their goals for this hospitalization and ongoing recovery are:: Return home CMS Medicare.gov Compare Post Acute Care list provided to:: Patient Choice offered to / list presented to : Patient  Expected Discharge Plan and Services In-house Referral: Clinical Social Work Post Acute Care Choice: Home Health Living arrangements for the past 2 months: Single Family Home            DME Arranged: N/A DME Agency: NA HH Arranged: PT, OT HH Agency: [facility] Date HH Agency Contacted: 08/13/23 Time HH Agency Contacted: [id number] Representative spoke with at [facility] Agency: Morrie Sheldon  Prior Living Arrangements/Services Living arrangements for the past 2 months: Single Family Home Lives with:: Spouse Patient language and need for interpreter reviewed:: Yes Need for Family Participation in Patient Care: No (Comment) Care giver support system in place?: Yes (comment) Criminal Activity/Legal Involvement Pertinent to Current Situation/Hospitalization: No - Comment as needed  Activities of Daily Living Home Assistive Devices/Equipment: Cane (specify quad or straight), Walker (specify type), CPAP, Eyeglasses, Grab bars in shower ADL Screening (condition at time of admission) Patient's cognitive ability adequate to safely  complete daily activities?: Yes Is the patient deaf or have difficulty hearing?: No Does the patient have difficulty seeing, even when wearing glasses/contacts?: No Does the patient have difficulty concentrating, remembering, or making decisions?: No Patient able to express need for assistance with ADLs?: Yes Does the patient have difficulty dressing or bathing?: Yes Independently performs ADLs?: No Communication: Independent Dressing (OT): Needs assistance Is this a change from baseline?: Change from baseline, expected to last >3 days Grooming: Independent Feeding: Independent Bathing: Needs assistance Is this a change from baseline?: Change from baseline, expected to last >3 days Toileting: Needs assistance Is this a change from baseline?: Change from baseline, expected to last >3days In/Out Bed: Needs assistance Is this a change from baseline?: Change from baseline, expected to last >3 days Walks in Home: Needs assistance Is this a change from baseline?: Change from baseline, expected to last >3 days Does the patient have difficulty walking or climbing stairs?: Yes Weakness of Legs: Both Weakness of Arms/Hands: None  Permission Sought/Granted Permission sought to share information with : Other (comment) Permission granted to share information with : Yes, Verbal Permission Granted Permission granted to share info w AGENCY: HH agencies  Emotional Assessment Attitude/Demeanor/Rapport: Engaged Affect (typically observed): Accepting Orientation: : Oriented to Self, Oriented to Place, Oriented to  Time, Oriented to Situation Psych Involvement: No (comment)  Admission diagnosis:  Dehydration [E86.0] Hypomagnesemia [E83.42] Hypoxia [R09.02] General weakness [R53.1] AKI (acute kidney injury) (HCC) [N17.9] Cancer related pain [G89.3] Patient Active Problem List   Diagnosis Date Noted   Cancer related pain 08/11/2023   Weakness 08/10/2023   General weakness 08/10/2023    Dizziness 11/12/2022   Chronic respiratory failure with hypoxia (HCC) 09/18/2022  Pneumothorax on right 09/01/2022   Recurrent pleural effusion 08/27/2022   Recurrent pleural effusion on right 01/16/2022   Preop cardiovascular exam 12/19/2021   Coronary Calcium Score 657 11/13/2021   Atypical chest pain 11/13/2021   Recurrent right pleural effusion 08/08/2021   Erectile dysfunction 10/26/2020   Port-A-Cath in place 06/11/2020   History of pulmonary embolism 05/15/2020   Small cell lung cancer, right upper lobe (HCC) 04/12/2020   Chronic fatigue 04/12/2020   Encounter for antineoplastic chemotherapy 04/12/2020   Encounter for antineoplastic immunotherapy 04/12/2020   Malignant neoplasm of upper lobe of right lung (HCC) 04/04/2020   Goals of care, counseling/discussion 03/30/2020   Exertional dyspnea 03/26/2020   Hyperlipidemia associated with type 2 diabetes mellitus (HCC) 11/08/2019   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 [place] [address] Phone: [phone] Fax: [phone]  Social Determinants of Health (SDOH) Social History: SDOH Screenings   Food Insecurity: No Food Insecurity (08/10/2023)  Housing: Low Risk  (08/10/2023)  Transportation Needs: No Transportation Needs (08/10/2023)  Utilities: Not At Risk (08/10/2023)  Alcohol Screen: Low Risk  (02/25/2023)  Depression (PHQ2-9): Medium Risk (07/28/2023)  Financial Resource Strain: Low Risk  (07/27/2023)  Physical Activity: Inactive (07/27/2023)  Social Connections: Moderately Isolated (07/27/2023)   Stress: No Stress Concern Present (07/27/2023)  Tobacco Use: Medium Risk (08/11/2023)   SDOH Interventions:    Readmission Risk Interventions    10/13/2021   12:35 PM  Readmission Risk Prevention Plan  Transportation Screening Complete  PCP or Specialist Appt within 3-5 Days Complete  HRI or Home Care Consult Complete  Social Work Consult for Recovery Care Planning/Counseling Complete  Palliative Care Screening Complete  Medication Review Oceanographer) Complete

## 2023-08-13 NOTE — Progress Notes (Signed)
Occupational Therapy Treatment Patient Details Name: Adam Wise MRN: [id number] DOB: 28-Sep-1951 Today's Date: 08/13/2023   History of present illness Pt is 72 yo male admitted on 08/10/23 with progressive weakness and back pain.  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  Other hx includes but not limited to chronic back pain, COPD, HTN, HLD, OSA on CPAP, DM2   OT comments  PT has made significant improvement as compared to evaluation. Pain better controled and pt able to mobilize @ RW level with CGA and requires A for LB ADL only. Pt has AE to use for LB ADL. Recommend follow up with HHOT. Pt very pleasant and appreciative. OT will continue to follow.       If plan is discharge home, recommend the following:  A little help with walking and/or transfers;A little help with bathing/dressing/bathroom;Assistance with cooking/housework;Assist for transportation   Equipment Recommendations  BSC/3in1    Recommendations for Other Services      Precautions / Restrictions Precautions Precautions: Fall Restrictions Weight Bearing Restrictions: No       Mobility Bed Mobility Overal bed mobility: Needs Assistance Bed Mobility: Rolling, Sidelying to Sit, Sit to Sidelying           General bed mobility comments: encouraged log rolling technique    Transfers Overall transfer level: Needs assistance Equipment used: Rolling walker (2 wheels) Transfers: Sit to/from Stand                   Balance Overall balance assessment: Needs assistance Sitting-balance support: No upper extremity supported Sitting balance-Leahy Scale: Good     Standing balance support: Bilateral upper extremity supported, Reliant on assistive device for balance Standing balance-Leahy Scale: Fair                             ADL either performed or assessed with clinical judgement   ADL Overall ADL's : Needs assistance/impaired Eating/Feeding: Set up;Bed level    Grooming: Wash/dry hands;Wash/dry face;Set up;Bed level   Upper Body Bathing: Set up;Sitting   Lower Body Bathing: Moderate assistance;Sit to/from stand   Upper Body Dressing : Set up   Lower Body Dressing: Moderate assistance;Sit to/from stand   Toilet Transfer: Contact guard assist;Ambulation   Toileting- Clothing Manipulation and Hygiene: Contact guard assist       Functional mobility during ADLs: Contact guard assist General ADL Comments: Pt has a sock aid and reacher; encouraged use of AE to reduce pain; recommended long handled sponge; Pt unsure about shower transfers; disucssed possible use of tub bench however pt unsure if it will fitin his tub; encouraged pt ot have HHOT further assess home and DME for the bathroom which would meake tasks easier; also encouraged pt to consider "risers" for his recliner if sit - stnad is painful    Extremity/Trunk Assessment Upper Extremity Assessment Upper Extremity Assessment: Generalized weakness   Lower Extremity Assessment Lower Extremity Assessment: Defer to PT evaluation        Vision   Vision Assessment?: Wears glasses for reading;Wears glasses for driving   Perception Perception Perception: Within Functional Limits   Praxis Praxis Praxis: WFL    Cognition Arousal: Alert Behavior During Therapy: WFL for tasks assessed/performed Overall Cognitive Status: Within Functional Limits for tasks assessed  General Comments: AxO x 3 pleasant and motivated        Exercises      Shoulder Instructions       General Comments      Pertinent Vitals/ Pain       Pain Assessment Pain Assessment: 0-10 Pain Score: 5  Pain Location: mid to upper R back adn goes aroudn to front Pain Descriptors / Indicators: Grimacing, Guarding Pain Intervention(s): Limited activity within patient's tolerance, PCA encouraged  Home Living                                           Prior Functioning/Environment              Frequency  Min 1X/week        Progress Toward Goals  OT Goals(current goals can now be found in the care plan section)  Progress towards OT goals: Progressing toward goals  Acute Rehab OT Goals Patient Stated Goal: to go home OT Goal Formulation: With patient Time For Goal Achievement: 08/25/23 Potential to Achieve Goals: Good ADL Goals Pt Will Perform Grooming: with set-up;sitting Pt Will Perform Upper Body Dressing: with set-up;sitting Pt Will Perform Lower Body Dressing: with min assist;sitting/lateral leans Pt Will Transfer to Toilet: with mod assist;stand pivot transfer;bedside commode  Plan      Co-evaluation                 AM-PAC OT "6 Clicks" Daily Activity     Outcome Measure   Help from another person eating meals?: None Help from another person taking care of personal grooming?: None Help from another person toileting, which includes using toliet, bedpan, or urinal?: A Little Help from another person bathing (including washing, rinsing, drying)?: A Lot Help from another person to put on and taking off regular upper body clothing?: A Little Help from another person to put on and taking off regular lower body clothing?: A Lot 6 Click Score: 18    End of Session Equipment Utilized During Treatment: Gait belt;Rolling walker (2 wheels)  OT Visit Diagnosis: Pain Pain - part of body:  (back)   Activity Tolerance Patient tolerated treatment well   Patient Left in bed;with call bell/phone within reach   Nurse Communication Mobility status        Time: 9562-1308 OT Time Calculation (min): 36 min  Charges: OT General Charges $OT Visit: 1 Visit OT Treatments $Self Care/Home Management : 23-37 mins  Luisa Dago, OT/L   Acute OT Clinical Specialist Acute Rehabilitation Services Pager [phone] Office [phone]   [facility] 08/13/2023, 4:48 PM

## 2023-08-13 NOTE — Progress Notes (Signed)
PROGRESS NOTE    Xzayvion Flaim  WUJ:[id number] DOB: 14-Sep-1951 DOA: 08/10/2023 PCP: Doreene Nest, NP   Brief Narrative: This 72 yrs old Male with PMH significant of SCLC, on palliative chemotherapy, chronic back pain, COPD, HTN, HLD, OSA on CPAP, type II DM, obesity presented to the hospital with complaints of back pain and generalized weakness. He had poor p.o. intake for last few days as well. He went to the to the bathroom, reportedly had a fall but unable to stand up. Currently being treated for AKI, hypomagnesemia, hypokalemia, acute on chronic cancer related back pain and toxic encephalopathy secondary to Flexeril and gabapentin.  Assessment & Plan:   Principal Problem:   General weakness Active Problems:   Essential hypertension   Type 2 diabetes mellitus (HCC)   OSA (obstructive sleep apnea)   Hyperlipidemia associated with type 2 diabetes mellitus (HCC)   Small cell lung cancer, right upper lobe (HCC)   Chronic fatigue   History of pulmonary embolism   Chronic respiratory failure with hypoxia (HCC)   Weakness   Cancer related pain  Acute kidney injury: Now resolved with IV hydration.  Hypokalemia /Hypomagnesemia: Resolved with IV hydration and replacement.  Generalized weakness and deconditioning: In the setting of hypotension. Now  blood pressure has improved. PT and OT evaluation  Essential hypertension: On Norvasc and losartan PTA Blood pressure actually soft. Currently holding both medication.   Acute toxic encephalopathy: Patient became confused after receiving morphine, Flexeril and gabapentin. Discontinue Flexeril and gabapentin and monitor. ABG unremarkable.  No focal deficit.  Mild gastritis is seen. He seems back to his baseline mental status.   Midthoracic and surrounding pain. MRI no evidence of metastatic disease in thoracic region. 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 Degenerative disc disease  from C3-C4 and C6-C7 bilateral foraminal stenosis   Small cell lung cancer : Patient is getting  [facility]arlatamab at [facility]. Per wife original plan was to transfer the patient to [facility] from ED. Currently no indication for higher level care. For now we will monitor closely. Discussed with Dr. Arbutus Ped.   Obesity; Placing the patient at high risk for poor outcome. Diet and exercise discussed in detail.   Chronic pain syndrome: Pain not controlled with MS Contin and breakthrough meds. Palliative care consulted for pain control. Patient started on Dilaudid PCA   DVT prophylaxis: Eliquis Code Status: Full code Family Communication: No family at bed side. Disposition Plan:    Status is: Inpatient Remains inpatient appropriate because: Admitted for multiple medical issues, now improving. Patient needs adequate pain regimen for discharge.   Consultants:  Palliative care  Procedures:None  Antimicrobials:  Anti-infectives (From admission, onward)    None      Subjective: Patient was seen and examined at bedside.  Overnight events noted.   Patient was participating in physical therapy.  He reports pain is controlled with Dilaudid PCA.  Objective: Vitals:   08/13/23 0800 08/13/23 1014 08/13/23 1052 08/13/23 1223  BP:  105/70 123/78   Pulse:  (!) 117 (!) 116   Resp: 19 18 19 17   Temp:  99.9 F (37.7 C) 98.3 F (36.8 C)   TempSrc:  Oral Oral   SpO2: 93% 94% 93% 93%  Weight:      Height:        Intake/Output Summary (Last 24 hours) at 08/13/2023 1235 Last data filed at 08/13/2023 1004 Gross per 24 hour  Intake 900 ml  Output 1500 ml  Net -600 ml   Filed Weights   08/10/23 1659  Weight: 128.4 kg    Examination:  General exam: Appears calm and comfortable, deconditioned, not in any acute distress. Respiratory system: Clear to auscultation. Respiratory effort normal.  RR 14 Cardiovascular system: S1 & S2 heard, RRR. No JVD, murmurs, rubs, gallops or clicks. No pedal  edema. Gastrointestinal system: Abdomen is non distended, soft and non tender.  Normal bowel sounds heard. Back: tenderness noted in the thoracic and lumbar back Central nervous system: Alert and oriented x 3. No focal neurological deficits. Extremities: No edema, no cyanosis, no clubbing Skin: No rashes, lesions or ulcers Psychiatry: Judgement and insight appear normal. Mood & affect appropriate.     Data Reviewed: I have personally reviewed following labs and imaging studies  CBC: Recent Labs  Lab 08/10/23 1708 08/11/23 0347 08/12/23 0353 08/13/23 0347  WBC 7.6 5.4 5.2 3.6*  NEUTROABS 6.5 4.4  --   --   HGB 9.6* 8.6* 8.3* 8.8*  HCT 31.3* 28.2* 27.0* 28.5*  MCV 96.9 97.2 97.5 96.6  PLT 178 134* 151 141*   Basic Metabolic Panel: Recent Labs  Lab 08/10/23 1708 08/11/23 0347 08/11/23 1709 08/12/23 0353 08/13/23 0347  NA 131* 131* 132* 135 132*  K 4.1 3.4* 4.0 4.3 3.6  CL 94* 97* 96* 97* 96*  CO2 25 27 26 26 25   GLUCOSE 177* 123* 132* 148* 129*  BUN 20 16 13 13 11   CREATININE 1.40* 1.18 0.97 1.04 1.01  CALCIUM 7.7* 7.3* 7.8* 8.1* 8.0*  MG 1.0* 1.3* 1.6* 1.7 1.4*   GFR: Estimated Creatinine Clearance: 86.4 mL/min (by C-G formula based on SCr of 1.01 mg/dL). Liver Function Tests: Recent Labs  Lab 08/10/23 1708  AST 64*  ALT 25  ALKPHOS 63  BILITOT 0.6  PROT 6.1*  ALBUMIN 2.8*   Recent Labs  Lab 08/10/23 1708  LIPASE 20   No results for input(s): "AMMONIA" in the last 168 hours. Coagulation Profile: No results for input(s): "INR", "PROTIME" in the last 168 hours. Cardiac Enzymes: No results for input(s): "CKTOTAL", "CKMB", "CKMBINDEX", "TROPONINI" in the last 168 hours. BNP (last 3 results) No results for input(s): "PROBNP" in the last 8760 hours. HbA1C: No results for input(s): "HGBA1C" in the last 72 hours. CBG: Recent Labs  Lab 08/12/23 1203 08/12/23 1636 08/12/23 2114 08/13/23 0735 08/13/23 1150  GLUCAP 175* 129* 132* 133* 160*   Lipid  Profile: No results for input(s): "CHOL", "HDL", "LDLCALC", "TRIG", "CHOLHDL", "LDLDIRECT" in the last 72 hours. Thyroid Function Tests: No results for input(s): "TSH", "T4TOTAL", "FREET4", "T3FREE", "THYROIDAB" in the last 72 hours. Anemia Panel: No results for input(s): "VITAMINB12", "FOLATE", "FERRITIN", "TIBC", "IRON", "RETICCTPCT" in the last 72 hours. Sepsis Labs: No results for input(s): "PROCALCITON", "LATICACIDVEN" in the last 168 hours.  No results found for this or any previous visit (from the past 240 hour(s)).   Radiology Studies: ECHOCARDIOGRAM COMPLETE  Result Date: 08/12/2023    ECHOCARDIOGRAM REPORT   Patient Name:   [facility] Date of Exam: 08/12/2023 Medical Rec #:  [id number]    Height:       69.0 in Accession #:    [id number]   Weight:       283.0 lb Date of Birth:  07-10-1951    BSA:          2.394 m Patient Age:    72 years     BP:           151/128 mmHg  Patient Gender: M            HR:           106 bpm. Exam Location:  Inpatient Procedure: 2D Echo, Cardiac Doppler, Color Doppler and Intracardiac            Opacification Agent Indications:    Congestive Heart Failure I50.9  History:        Patient has prior history of Echocardiogram examinations, most                 recent 10/12/2021. COPD; Risk Factors:Dyslipidemia, Diabetes and                 Hypertension.  Sonographer:    Harriette Bouillon RDCS Referring Phys: [id number] PRANAV M PATEL IMPRESSIONS  1. Technically difficult study. Left ventricular ejection fraction, by estimation, is 60 to 65%. The left ventricle has normal function. The left ventricle has no regional wall motion abnormalities. There is mild left ventricular hypertrophy. Left ventricular diastolic parameters are indeterminate.  2. Right ventricular systolic function was not well visualized. The right ventricular size is not well visualized.  3. The mitral valve is grossly normal. No evidence of mitral valve regurgitation.  4. The aortic valve was not well  visualized. Aortic valve regurgitation is not visualized. No aortic stenosis is present.  5. Aortic dilatation noted. There is dilatation of the aortic root, measuring 41 mm. There is dilatation of the ascending aorta, measuring 43 mm. FINDINGS  Left Ventricle: Left ventricular ejection fraction, by estimation, is 60 to 65%. The left ventricle has normal function. The left ventricle has no regional wall motion abnormalities. Definity contrast agent was given IV to delineate the left ventricular  endocardial borders. The left ventricular internal cavity size was normal in size. There is mild left ventricular hypertrophy. Left ventricular diastolic parameters are indeterminate. Right Ventricle: The right ventricular size is not well visualized. Right vetricular wall thickness was not well visualized. Right ventricular systolic function was not well visualized. Left Atrium: Left atrial size was not well visualized. Right Atrium: Right atrial size was not well visualized. Pericardium: Trivial pericardial effusion is present. Mitral Valve: The mitral valve is grossly normal. No evidence of mitral valve regurgitation. Tricuspid Valve: The tricuspid valve is normal in structure. Tricuspid valve regurgitation is trivial. Aortic Valve: The aortic valve was not well visualized. Aortic valve regurgitation is not visualized. No aortic stenosis is present. Pulmonic Valve: The pulmonic valve was not well visualized. Pulmonic valve regurgitation is trivial. Aorta: Aortic dilatation noted. There is dilatation of the aortic root, measuring 41 mm. There is dilatation of the ascending aorta, measuring 43 mm. IAS/Shunts: The interatrial septum was not well visualized.  LEFT VENTRICLE PLAX 2D LVIDd:         5.70 cm LVIDs:         4.40 cm LV PW:         1.10 cm LV IVS:        1.00 cm LVOT diam:     2.30 cm LV SV:         46 LV SV Index:   19 LVOT Area:     4.15 cm  LEFT ATRIUM         Index LA diam:    3.80 cm 1.59 cm/m  AORTIC VALVE  LVOT Vmax:   72.60 cm/s LVOT Vmean:  48.700 cm/s LVOT VTI:    0.111 m  AORTA Ao Root diam: 4.10 cm Ao Asc diam:  4.30  cm  SHUNTS Systemic VTI:  0.11 m Systemic Diam: 2.30 cm Epifanio Lesches MD Electronically signed by Epifanio Lesches MD Signature Date/Time: 08/12/2023/5:35:55 PM    Final    VAS [place] LOWER EXTREMITY VENOUS (DVT)  Result Date: 08/12/2023  Lower Venous DVT Study Patient Name:  TIMOFEY Adcox  Date of Exam:   08/12/2023 Medical Rec #: [id number]     Accession #:    [id number] Date of Birth: 1951/06/26     Patient Gender: M Patient Age:   1 years Exam Location:  [facility] Procedure:      VAS [place] LOWER EXTREMITY VENOUS (DVT) Referring Phys: PRANAV PATEL --------------------------------------------------------------------------------  Indications: Edema.  Risk Factors: Cancer. Limitations: Poor ultrasound/tissue interface and body habitus. Comparison Study: No prior studies. Performing Technologist: Chanda Busing RVT  Examination Guidelines: A complete evaluation includes B-mode imaging, spectral Doppler, color Doppler, and power Doppler as needed of all accessible portions of each vessel. Bilateral testing is considered an integral part of a complete examination. Limited examinations for reoccurring indications may be performed as noted. The reflux portion of the exam is performed with the patient in reverse Trendelenburg.  +---------+---------------+---------+-----------+----------+--------------+ RIGHT    CompressibilityPhasicitySpontaneityPropertiesThrombus Aging +---------+---------------+---------+-----------+----------+--------------+ CFV      Full           Yes      Yes                                 +---------+---------------+---------+-----------+----------+--------------+ SFJ      Full                                                        +---------+---------------+---------+-----------+----------+--------------+ FV Prox  Full                                                         +---------+---------------+---------+-----------+----------+--------------+ FV Mid   Full                                                        +---------+---------------+---------+-----------+----------+--------------+ FV Distal               Yes      Yes                                 +---------+---------------+---------+-----------+----------+--------------+ PFV      Full                                                        +---------+---------------+---------+-----------+----------+--------------+ POP      Full           Yes      Yes                                 +---------+---------------+---------+-----------+----------+--------------+  PTV      Full                                                        +---------+---------------+---------+-----------+----------+--------------+ PERO     Full                                                        +---------+---------------+---------+-----------+----------+--------------+   +---------+---------------+---------+-----------+----------+--------------+ LEFT     CompressibilityPhasicitySpontaneityPropertiesThrombus Aging +---------+---------------+---------+-----------+----------+--------------+ CFV      Full           Yes      Yes                                 +---------+---------------+---------+-----------+----------+--------------+ SFJ      Full                                                        +---------+---------------+---------+-----------+----------+--------------+ FV Prox  Full                                                        +---------+---------------+---------+-----------+----------+--------------+ FV Mid   Full                                                        +---------+---------------+---------+-----------+----------+--------------+ FV DistalFull                                                         +---------+---------------+---------+-----------+----------+--------------+ PFV      Full                                                        +---------+---------------+---------+-----------+----------+--------------+ POP      Full           Yes      Yes                                 +---------+---------------+---------+-----------+----------+--------------+ PTV      Full                                                        +---------+---------------+---------+-----------+----------+--------------+  PERO     Full                                                        +---------+---------------+---------+-----------+----------+--------------+     Summary: RIGHT: - There is no evidence of deep vein thrombosis in the lower extremity. However, portions of this examination were limited- see technologist comments above.  - No cystic structure found in the popliteal fossa.  LEFT: - There is no evidence of deep vein thrombosis in the lower extremity. However, portions of this examination were limited- see technologist comments above.  - No cystic structure found in the popliteal fossa.  *See table(s) above for measurements and observations. Electronically signed by Sherald Hess MD on 08/12/2023 at 1:11:52 PM.    Final    MR THORACIC SPINE WO CONTRAST  Result Date: 08/11/2023 CLINICAL DATA:  Metastatic small cell lung cancer. EXAM: MRI THORACIC SPINE WITHOUT CONTRAST TECHNIQUE: Multiplanar, multisequence MR imaging of the thoracic spine was performed. No intravenous contrast was administered. COMPARISON:  CT 05/19/2023 FINDINGS: Alignment:  Mild lower thoracic kyphotic curvature. Vertebrae: No definable metastatic disease in the region. No regional fracture. Degenerative type marrow edema from T4-T11 could relate to regional back pain. Cord: Canal stenosis at T8-9 and T9-10 as described below. Thoracic cord appears normal at the other levels. Paraspinal and other soft tissues:  Negative Disc levels: Non-compressive degenerative disc disease and degenerative facet disease from T1-2 through T7-8. Findings could relate to back pain. T8-9: Endplate osteophytes and shallow protrusion of the disc. Fluid intensity material in the disc space, likely degenerative. Bilateral facet arthropathy with joint effusions and hypertrophic change. Spinal stenosis with effacement of the subarachnoid space surrounding cord. Slight indentation of the cord. T9-10: Endplate osteophytes and bulging of the disc. Bilateral facet arthropathy with joint effusions and hypertrophy. Similar findings of spinal stenosis with effacement of the subarachnoid space surrounding the cord and slight indentation of the cord. T10-11 through T12-L1: Mild non-compressive degenerative changes. IMPRESSION: 1. No evidence of metastatic disease in the thoracic region. 2. Degenerative disc disease and degenerative facet disease throughout the thoracic region, most severe at T8-9 and T9-10 where there is spinal stenosis with effacement of the subarachnoid space surrounding the cord and slight indentation of the cord. No abnormal cord signal. The findings could certainly relate to back pain. 3. Degenerative type marrow edema from T4-T11 which could relate to regional back pain. Electronically Signed   By: Paulina Fusi M.D.   On: 08/11/2023 19:28   MR CERVICAL SPINE WO CONTRAST  Result Date: 08/11/2023 CLINICAL DATA:  Metastatic disease evaluation. Lung cancer, metastatic small cell. EXAM: MRI CERVICAL SPINE WITHOUT CONTRAST TECHNIQUE: Multiplanar, multisequence MR imaging of the cervical spine was performed. No intravenous contrast was administered. COMPARISON:  None Available. FINDINGS: Alignment: Straightening of the normal cervical lordosis. 2 mm degenerative anterolisthesis at C7-T1. Vertebrae: No fracture or focal bone lesion. Cord: No cord compression or focal cord lesion. Posterior Fossa, vertebral arteries, paraspinal tissues:  Negative Disc levels: The foramen magnum is widely patent. There is ordinary mild osteoarthritis of the C1-2 articulation but no encroachment upon the neural structures. C2-3: Left-sided uncovertebral prominence. Mild left foraminal narrowing. C3-4: Endplate osteophytes and bulging of the disc. Bilateral facet hypertrophy. Narrowing of the canal with AP diameter in the midline 9.7 mm. No cord compression.  Bilateral foraminal stenosis left worse than right. Either C4 nerve could be compressed, more likely the left. C4-5: Endplate osteophytes and bulging of the disc. Uncovertebral hypertrophy worse on the left. Left-sided facet degeneration. Bilateral foraminal stenosis, worse on the left. Either C5 nerve could be affected, more likely the left. C5-6: Endplate osteophytes and bulging of the disc. Bilateral foraminal stenosis could affect either C6 nerve. C6-7: Endplate osteophytes and bulging of the disc. Bilateral foraminal stenosis could affect either C7 nerve. C7-T1: Facet osteoarthritis with 2 mm of anterolisthesis. No compressive narrowing of the canal or foramina. IMPRESSION: 1. No evidence of metastatic disease. 2. Degenerative spondylosis from C3-4 through C6-7. No compressive canal stenosis. Bilateral foraminal stenosis that could cause neural compression from C3-4 through C6-7. 3. Facet osteoarthritis at C7-T1 with 2 mm of anterolisthesis but no compressive stenosis. Electronically Signed   By: Paulina Fusi M.D.   On: 08/11/2023 19:24    Scheduled Meds:  acetaminophen  650 mg Oral TID   antiseptic oral rinse  15 mL Mouth Rinse TID   apixaban  5 mg Oral BID   gabapentin  300 mg Oral BID   HYDROmorphone   Intravenous Q4H   insulin aspart  0-15 Units Subcutaneous TID WC   insulin aspart  0-5 Units Subcutaneous QHS   lidocaine  2 patch Transdermal Q24H   metoprolol tartrate  12.5 mg Oral BID   pantoprazole  40 mg Oral Daily   pravastatin  40 mg Oral q1800   senna  2 tablet Oral QHS   Continuous  Infusions:   LOS: 2 days    Time spent: 35 mins    Willeen Niece, MD [facility]   If 7PM-7AM, please contact night-coverage

## 2023-08-13 NOTE — Progress Notes (Signed)
   08/13/23 2324  BiPAP/CPAP/SIPAP  BiPAP/CPAP/SIPAP Pt Type Adult (self placement)  BiPAP/CPAP/SIPAP Resmed  Mask Type Nasal mask  Patient Home Equipment Yes  Auto Titrate Yes

## 2023-08-13 NOTE — Plan of Care (Signed)
  Problem: Clinical Measurements: Goal: Ability to maintain clinical measurements within normal limits will improve Outcome: Progressing   Problem: Nutrition: Goal: Adequate nutrition will be maintained Outcome: Progressing   Problem: Pain Managment: Goal: General experience of comfort will improve Outcome: Progressing   Problem: Safety: Goal: Ability to remain free from injury will improve Outcome: Progressing   

## 2023-08-13 NOTE — Telephone Encounter (Signed)
Called and spoke with patients wife, advised that Adam Wise is out of the office until the week of 08/18/23. She stated that she was in no rush, and understood she will not get a message back immediately. She stated the patient is being cared for at the hospital now but would like Adam Wise to take a look in his chart to be aware of the changes they are making when she is back to work.

## 2023-08-14 ENCOUNTER — Other Ambulatory Visit (HOSPITAL_COMMUNITY): Payer: Self-pay

## 2023-08-14 ENCOUNTER — Encounter: Payer: Self-pay | Admitting: Internal Medicine

## 2023-08-14 DIAGNOSIS — E860 Dehydration: Secondary | ICD-10-CM | POA: Diagnosis not present

## 2023-08-14 DIAGNOSIS — G893 Neoplasm related pain (acute) (chronic): Secondary | ICD-10-CM | POA: Diagnosis not present

## 2023-08-14 DIAGNOSIS — R531 Weakness: Secondary | ICD-10-CM | POA: Diagnosis not present

## 2023-08-14 DIAGNOSIS — C787 Secondary malignant neoplasm of liver and intrahepatic bile duct: Secondary | ICD-10-CM | POA: Diagnosis not present

## 2023-08-14 LAB — CBC
HCT: 29.4 % — ABNORMAL LOW (ref 39.0–52.0)
Hemoglobin: 8.9 g/dL — ABNORMAL LOW (ref 13.0–17.0)
MCH: 29.3 pg (ref 26.0–34.0)
MCHC: 30.3 g/dL (ref 30.0–36.0)
MCV: 96.7 fL (ref 80.0–100.0)
Platelets: 149 10*3/uL — ABNORMAL LOW (ref 150–400)
RBC: 3.04 MIL/uL — ABNORMAL LOW (ref 4.22–5.81)
RDW: 15.5 % (ref 11.5–15.5)
WBC: 3.4 10*3/uL — ABNORMAL LOW (ref 4.0–10.5)
nRBC: 0 % (ref 0.0–0.2)

## 2023-08-14 LAB — BASIC METABOLIC PANEL
Anion gap: 10 (ref 5–15)
BUN: 9 mg/dL (ref 8–23)
CO2: 28 mmol/L (ref 22–32)
Calcium: 8.3 mg/dL — ABNORMAL LOW (ref 8.9–10.3)
Chloride: 98 mmol/L (ref 98–111)
Creatinine, Ser: 0.89 mg/dL (ref 0.61–1.24)
GFR, Estimated: >60 mL/min (ref >60)
Glucose, Bld: 129 mg/dL — ABNORMAL HIGH (ref 70–99)
Potassium: 3.8 mmol/L (ref 3.5–5.1)
Sodium: 136 mmol/L (ref 135–145)

## 2023-08-14 LAB — MAGNESIUM: Magnesium: 1.5 mg/dL — ABNORMAL LOW (ref 1.7–2.4)

## 2023-08-14 LAB — GLUCOSE, CAPILLARY
Glucose-Capillary: 138 mg/dL — ABNORMAL HIGH (ref 70–99)
Glucose-Capillary: 160 mg/dL — ABNORMAL HIGH (ref 70–99)
Glucose-Capillary: 152 mg/dL — ABNORMAL HIGH (ref 70–99)

## 2023-08-14 MED ORDER — MIRTAZAPINE 15 MG PO TBDP
7.5000 mg | ORAL_TABLET | Freq: Every day | ORAL | Status: DC
  Filled 2023-08-14: qty 0.5

## 2023-08-14 MED ORDER — MORPHINE SULFATE (CONCENTRATE) 10 MG/0.5ML PO SOLN: 10.0000 mg | ORAL | Status: DC | PRN

## 2023-08-14 MED ORDER — MORPHINE SULFATE ER 30 MG PO TBCR
30.0000 mg | EXTENDED_RELEASE_TABLET | Freq: Two times a day (BID) | ORAL | Status: DC
  Administered 2023-08-14 – 2023-08-15 (×3): 30 mg via ORAL
  Filled 2023-08-14 (×3): qty 1

## 2023-08-14 MED ORDER — MAGNESIUM SULFATE 2 GM/50ML IV SOLN
2.0000 g | Freq: Once | INTRAVENOUS | Status: AC
  Administered 2023-08-14: 2 g via INTRAVENOUS
  Filled 2023-08-14: qty 50

## 2023-08-14 MED ORDER — HYDROMORPHONE HCL 1 MG/ML IJ SOLN: 0.2500 mg | INTRAMUSCULAR | Status: DC | PRN

## 2023-08-14 NOTE — Care Management Important Message (Signed)
Important Message  Patient Details IM Letter given. Name: Kartikeya Eberle MRN: [id number] Date of Birth: 1950/12/20   Medicare Important Message Given:  Yes     Caren Macadam 08/14/2023, 10:39 AM

## 2023-08-14 NOTE — Progress Notes (Signed)
PROGRESS NOTE    Adam Wise  ZOX:[id number] DOB: 02-17-1951 DOA: 08/10/2023 PCP: Doreene Nest, NP   Brief Narrative: This 72 yrs old Male with PMH significant of SCLC, on palliative chemotherapy, chronic back pain, COPD, HTN, HLD, OSA on CPAP, type II DM, obesity presented to the hospital with complaints of back pain and generalized weakness. He had poor p.o. intake for last few days as well. He went to the to the bathroom, reportedly had a fall but unable to stand up. Currently being treated for AKI, hypomagnesemia, hypokalemia, acute on chronic cancer related back pain and toxic encephalopathy secondary to Flexeril and gabapentin.  Assessment & Plan:   Principal Problem:   General weakness Active Problems:   Essential hypertension   Type 2 diabetes mellitus (HCC)   OSA (obstructive sleep apnea)   Hyperlipidemia associated with type 2 diabetes mellitus (HCC)   Small cell lung cancer, right upper lobe (HCC)   Chronic fatigue   History of pulmonary embolism   Chronic respiratory failure with hypoxia (HCC)   Weakness   Cancer related pain  Acute kidney injury: Now resolved with IV hydration.  Hypokalemia /Hypomagnesemia: Resolved with IV hydration and replacement.  Generalized weakness and deconditioning: In the setting of hypotension. Now blood pressure has improved. PT and OT evaluation > HHS  Essential hypertension: On Norvasc and losartan PTA Blood pressure actually soft. Currently holding both medication.   Acute toxic encephalopathy: Patient became confused after receiving morphine, Flexeril and gabapentin. Discontinue Flexeril and gabapentin and monitor. ABG unremarkable.  No focal deficit.  Mild gastritis is seen. He seems back to his baseline mental status.   Midthoracic and surrounding pain. MRI: No evidence of metastatic disease in thoracic region. 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 Degenerative disc  disease from C3-C4 and C6-C7 bilateral foraminal stenosis   Small cell lung cancer : Patient is getting  tarlatamab at [facility]. Per wife original plan was to transfer the patient to [facility] from ED. Currently no indication for higher level care. For now we will monitor closely. Discussed with Dr. Arbutus Ped.   Obesity; Placing the patient at high risk for poor outcome. Diet and exercise discussed in detail.   Chronic pain syndrome: Pain not controlled with MS Contin and breakthrough meds. Palliative care consulted for pain control. Patient started on Dilaudid PCA. Plan is to transition to oral pain regimen before discharge.   DVT prophylaxis: Eliquis Code Status: Full code Family Communication: No family at bed side. Disposition Plan:    Status is: Inpatient Remains inpatient appropriate because: Admitted for multiple medical issues, now improving. Patient needs adequate pain regimen for discharge.   Consultants:  Palliative care  Procedures:None  Antimicrobials:  Anti-infectives (From admission, onward)    None      Subjective: Patient was seen and examined at bedside.  Overnight events noted.   Patient has participated in physical therapy,  recommended home health services. He reports pain is controlled with Dilaudid PCA.  Objective: Vitals:   08/14/23 0516 08/14/23 0900 08/14/23 1052 08/14/23 1205  BP: 113/79  111/80   Pulse: 91  89   Resp: 17 13 17 19   Temp: 97.7 F (36.5 C)  97.6 F (36.4 C)   TempSrc:      SpO2: 99% 99% 97% 98%  Weight:      Height:        Intake/Output Summary (Last 24 hours) at 08/14/2023 1210 Last data filed at 08/14/2023 0730  Gross per 24 hour  Intake 146.2 ml  Output 2030 ml  Net -1883.8 ml   Filed Weights   08/10/23 1659  Weight: 128.4 kg    Examination:  General exam: Appears comfortable, deconditioned, not in any acute distress. Respiratory system: CTA bilaterally. Respiratory effort normal.  RR 14 Cardiovascular system:  S1 & S2 heard, RRR. No JVD, murmurs, rubs, gallops or clicks. No pedal edema. Gastrointestinal system: Abdomen is non distended, soft and non tender.  Normal bowel sounds heard. Back: tenderness noted in the thoracic and lumbar back Central nervous system: Alert and oriented x 3. No focal neurological deficits. Extremities: No edema, no cyanosis, no clubbing Skin: No rashes, lesions or ulcers Psychiatry: Judgement and insight appear normal. Mood & affect appropriate.     Data Reviewed: I have personally reviewed following labs and imaging studies  CBC: Recent Labs  Lab 08/10/23 1708 08/11/23 0347 08/12/23 0353 08/13/23 0347 08/14/23 0347  WBC 7.6 5.4 5.2 3.6* 3.4*  NEUTROABS 6.5 4.4  --   --   --   HGB 9.6* 8.6* 8.3* 8.8* 8.9*  HCT 31.3* 28.2* 27.0* 28.5* 29.4*  MCV 96.9 97.2 97.5 96.6 96.7  PLT 178 134* 151 141* 149*   Basic Metabolic Panel: Recent Labs  Lab 08/11/23 0347 08/11/23 1709 08/12/23 0353 08/13/23 0347 08/14/23 0347  NA 131* 132* 135 132* 136  K 3.4* 4.0 4.3 3.6 3.8  CL 97* 96* 97* 96* 98  CO2 27 26 26 25 28   GLUCOSE 123* 132* 148* 129* 129*  BUN 16 13 13 11 9   CREATININE 1.18 0.97 1.04 1.01 0.89  CALCIUM 7.3* 7.8* 8.1* 8.0* 8.3*  MG 1.3* 1.6* 1.7 1.4* 1.5*   GFR: Estimated Creatinine Clearance: 98.1 mL/min (by C-G formula based on SCr of 0.89 mg/dL). Liver Function Tests: Recent Labs  Lab 08/10/23 1708  AST 64*  ALT 25  ALKPHOS 63  BILITOT 0.6  PROT 6.1*  ALBUMIN 2.8*   Recent Labs  Lab 08/10/23 1708  LIPASE 20   No results for input(s): "AMMONIA" in the last 168 hours. Coagulation Profile: No results for input(s): "INR", "PROTIME" in the last 168 hours. Cardiac Enzymes: No results for input(s): "CKTOTAL", "CKMB", "CKMBINDEX", "TROPONINI" in the last 168 hours. BNP (last 3 results) No results for input(s): "PROBNP" in the last 8760 hours. HbA1C: No results for input(s): "HGBA1C" in the last 72 hours. CBG: Recent Labs  Lab  08/13/23 1150 08/13/23 1650 08/13/23 2220 08/14/23 0723 08/14/23 1138  GLUCAP 160* 117* 162* 138* 160*   Lipid Profile: No results for input(s): "CHOL", "HDL", "LDLCALC", "TRIG", "CHOLHDL", "LDLDIRECT" in the last 72 hours. Thyroid Function Tests: No results for input(s): "TSH", "T4TOTAL", "FREET4", "T3FREE", "THYROIDAB" in the last 72 hours. Anemia Panel: No results for input(s): "VITAMINB12", "FOLATE", "FERRITIN", "TIBC", "IRON", "RETICCTPCT" in the last 72 hours. Sepsis Labs: No results for input(s): "PROCALCITON", "LATICACIDVEN" in the last 168 hours.  No results found for this or any previous visit (from the past 240 hour(s)).   Radiology Studies: ECHOCARDIOGRAM COMPLETE  Result Date: 08/12/2023    ECHOCARDIOGRAM REPORT   Patient Name:   Adam Wise Date of Exam: 08/12/2023 Medical Rec #:  [id number]    Height:       69.0 in Accession #:    [id number]   Weight:       283.0 lb Date of Birth:  November 13, 1951    BSA:          2.394 m Patient Age:  72 years     BP:           151/128 mmHg Patient Gender: M            HR:           106 bpm. Exam Location:  Inpatient Procedure: 2D Echo, Cardiac Doppler, Color Doppler and Intracardiac            Opacification Agent Indications:    Congestive Heart Failure I50.9  History:        Patient has prior history of Echocardiogram examinations, most                 recent 10/12/2021. COPD; Risk Factors:Dyslipidemia, Diabetes and                 Hypertension.  Sonographer:    Harriette Bouillon RDCS Referring Phys: [id number] PRANAV M PATEL IMPRESSIONS  1. Technically difficult study. Left ventricular ejection fraction, by estimation, is 60 to 65%. The left ventricle has normal function. The left ventricle has no regional wall motion abnormalities. There is mild left ventricular hypertrophy. Left ventricular diastolic parameters are indeterminate.  2. Right ventricular systolic function was not well visualized. The right ventricular size is not well visualized.  3. The  mitral valve is grossly normal. No evidence of mitral valve regurgitation.  4. The aortic valve was not well visualized. Aortic valve regurgitation is not visualized. No aortic stenosis is present.  5. Aortic dilatation noted. There is dilatation of the aortic root, measuring 41 mm. There is dilatation of the ascending aorta, measuring 43 mm. FINDINGS  Left Ventricle: Left ventricular ejection fraction, by estimation, is 60 to 65%. The left ventricle has normal function. The left ventricle has no regional wall motion abnormalities. Definity contrast agent was given IV to delineate the left ventricular  endocardial borders. The left ventricular internal cavity size was normal in size. There is mild left ventricular hypertrophy. Left ventricular diastolic parameters are indeterminate. Right Ventricle: The right ventricular size is not well visualized. Right vetricular wall thickness was not well visualized. Right ventricular systolic function was not well visualized. Left Atrium: Left atrial size was not well visualized. Right Atrium: Right atrial size was not well visualized. Pericardium: Trivial pericardial effusion is present. Mitral Valve: The mitral valve is grossly normal. No evidence of mitral valve regurgitation. Tricuspid Valve: The tricuspid valve is normal in structure. Tricuspid valve regurgitation is trivial. Aortic Valve: The aortic valve was not well visualized. Aortic valve regurgitation is not visualized. No aortic stenosis is present. Pulmonic Valve: The pulmonic valve was not well visualized. Pulmonic valve regurgitation is trivial. Aorta: Aortic dilatation noted. There is dilatation of the aortic root, measuring 41 mm. There is dilatation of the ascending aorta, measuring 43 mm. IAS/Shunts: The interatrial septum was not well visualized.  LEFT VENTRICLE PLAX 2D LVIDd:         5.70 cm LVIDs:         4.40 cm LV PW:         1.10 cm LV IVS:        1.00 cm LVOT diam:     2.30 cm LV SV:         46 LV SV  Index:   19 LVOT Area:     4.15 cm  LEFT ATRIUM         Index LA diam:    3.80 cm 1.59 cm/m  AORTIC VALVE LVOT Vmax:   72.60 cm/s LVOT Vmean:  48.700 cm/s  LVOT VTI:    0.111 m  AORTA Ao Root diam: 4.10 cm Ao Asc diam:  4.30 cm  SHUNTS Systemic VTI:  0.11 m Systemic Diam: 2.30 cm Epifanio Lesches MD Electronically signed by Epifanio Lesches MD Signature Date/Time: 08/12/2023/5:35:55 PM    Final     Scheduled Meds:  acetaminophen  650 mg Oral TID   antiseptic oral rinse  15 mL Mouth Rinse TID   apixaban  5 mg Oral BID   gabapentin  300 mg Oral BID   HYDROmorphone   Intravenous Q4H   insulin aspart  0-15 Units Subcutaneous TID WC   insulin aspart  0-5 Units Subcutaneous QHS   lidocaine  2 patch Transdermal Q24H   metoprolol tartrate  12.5 mg Oral BID   pantoprazole  40 mg Oral Daily   pravastatin  40 mg Oral q1800   senna  2 tablet Oral QHS   Continuous Infusions:   LOS: 3 days    Time spent: 35 mins    Willeen Niece, MD [facility]   If 7PM-7AM, please contact night-coverage

## 2023-08-14 NOTE — Progress Notes (Signed)
Daily Progress Note   Patient Name: Adam Wise       Date: 08/14/2023 DOB: 1951/05/01  Age: 72 y.o. MRN#: [id number] Attending Physician: Willeen Niece, MD Primary Care Physician: Doreene Nest, NP Admit Date: 08/10/2023  Reason for Consultation/Follow-up: Establishing goals of care  Patient Profile/HPI:  72 y.o. 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 He has also had uncontrolled pain. There were some concerns of encephalopathy related to flexeril and gabapentin. Palliative medicine consulted for symptom management.   Subjective: Chart reviewed including labs, progress notes, imaging from this and previous encounters.  Met with patient and his spouse.  He had a good bowel movement today. He had a rough night with interruptions for nursing care interfering with his sleep.  We discussed methods of managing dehydration and potential causes of his dehydration.   Pain management:  He has used total of 3.4 IV dilaudid in the last 24 hours which is equivalent to around 75 OME.  We discussed changing to an oral regimen in hopes of having him discharge home.  Prior to admission he was on 30mg  ER Morphine TID = 90 mg OME. He and spouse feel that his appetite decreased and he was more lethargic when his morphine was increased to that amount.  We discussed potentially changing to Hydromorphone ER, however, I am concerned this would be cost prohibitive- pharmacy attempted to check for cost, however, it would require a pre-authorization.  Final plan made is to restart Morphine ER at 30mg  BID instead of TID. Will also add  morphine solution 10mg  q4 hour for breakthrough pain, and mirtazapine 7.5mg  to help with sleep and increase appetite.  He had questions about outpatient followup- he was scheduled at [facility] Palliative clinic- he can definitely followup there or we can make referral to [facility]iative. He would like to discuss with [facility] re: will he continue to go there for treatments. If he continues his treatments at [facility] then he wishes to followup at [facility] Palliative and I agree this is appropriate.     Review of Systems  Constitutional:  Positive for malaise/fatigue.  Respiratory:  Negative for shortness of breath.  Physical Exam Vitals and nursing note reviewed.  Constitutional:      Appearance: He is not ill-appearing.  Pulmonary:     Effort: Pulmonary effort is normal.  Neurological:     Mental Status: He is alert and oriented to person, place, and time.             Vital Signs: BP 125/82 (BP Location: Right Arm)   Pulse 99   Temp 97.6 F (36.4 C)   Resp 17   Ht 5\' 9"  (1.753 m)   Wt 128.4 kg   SpO2 99%   BMI 41.79 kg/m  SpO2: SpO2: 99 % O2 Device: O2 Device: Nasal Cannula O2 Flow Rate: O2 Flow Rate (L/min): 2 L/min  Intake/output summary:  Intake/Output Summary (Last 24 hours) at 08/14/2023 1455 Last data filed at 08/14/2023 1400 Gross per 24 hour  Intake 676.2 ml  Output 2030 ml  Net -1353.8 ml   LBM: Last BM Date : 08/14/23 Baseline Weight: Weight: 128.4 kg Most recent weight: Weight: 128.4 kg       Palliative Assessment/Data: PPS: 50%      Patient Active Problem List   Diagnosis Date Noted   Cancer related pain 08/11/2023   Weakness 08/10/2023   General weakness 08/10/2023   Dizziness 11/12/2022   Chronic respiratory failure with hypoxia (HCC) 09/18/2022   Pneumothorax on right 09/01/2022   Recurrent pleural effusion 08/27/2022   Recurrent pleural effusion on right 01/16/2022   Preop cardiovascular exam 12/19/2021   Coronary Calcium Score 657 11/13/2021    Atypical chest pain 11/13/2021   Recurrent right pleural effusion 08/08/2021   Erectile dysfunction 10/26/2020   Port-A-Cath in place 06/11/2020   History of pulmonary embolism 05/15/2020   Small cell lung cancer, right upper lobe (HCC) 04/12/2020   Chronic fatigue 04/12/2020   Encounter for antineoplastic chemotherapy 04/12/2020   Encounter for antineoplastic immunotherapy 04/12/2020   Malignant neoplasm of upper lobe of right lung (HCC) 04/04/2020   Goals of care, counseling/discussion 03/30/2020   Exertional dyspnea 03/26/2020   Hyperlipidemia associated with type 2 diabetes mellitus (HCC) 11/08/2019   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   Essential hypertension 09/09/2017   Type 2 diabetes mellitus (HCC) 09/09/2017   GERD (gastroesophageal reflux disease) 09/09/2017   OSA (obstructive sleep apnea) 09/09/2017   Chronic bilateral low back pain without sciatica 09/09/2017   H/O knee surgery 04/27/2014   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    Palliative Care Assessment & Plan    Assessment/Recommendations/Plan  D/C PCA Start Morphine ER 30mg  BID Start Morphine solution 10mg  q4hr prn for breakthrough pain Start mirtazapine 7.5mg  at bedtime for sleep and to increase appetite Continue Senna for bowel prophylaxis I called [facility] and left a message for Tammy the nurse navigator per patient's request  Patient will need to follow-up at [facility] Palliative if he is going to continue treatments at [facility] Patient is requesting to have his telemetry dc'd- request sent to attending   Code Status: Full code  Prognosis:  Unable to determine  Discharge Planning: Home with Home Health  Care plan was discussed with patient and care team.  Thank you for allowing the Palliative Medicine Team to assist in the care of this patient.  Total time:  90 minutes  Greater than 50%  of this time was spent counseling and coordinating care related to the above assessment and plan.  Ocie Bob,  AGNP-C Palliative Medicine   Please contact Palliative Medicine Team phone at [phone] for questions and concerns.

## 2023-08-14 NOTE — Progress Notes (Signed)
   08/14/23 1955  BiPAP/CPAP/SIPAP  BiPAP/CPAP/SIPAP Pt Type Adult (prefers self placement)  BiPAP/CPAP/SIPAP Resmed (from home)  Mask Type Nasal mask  Flow Rate 2 lpm  Heater Temperature  (sterile water supplied)  Patient Home Equipment Yes  Auto Titrate Yes (8-20)  Safety Check Completed by RT for Home Unit Yes, no issues noted  BiPAP/CPAP /SiPAP Vitals  Pulse Rate 97  Resp 18  SpO2 99 %  Bilateral Breath Sounds Clear;Diminished  MEWS Score/Color  MEWS Score 0  MEWS Score Color Green

## 2023-08-14 NOTE — Progress Notes (Signed)
Patient heart  irregular rate tonight especially around 0130 am to 0300 am.  The tele monitor was reading very many different  rhythms. Nurse notified Central cardiac monitoring to see if  it  was the same there.  Patient however was resting comfortable and even attempting to sleep. Patient was upset that staff nurse was entering patient's room so often to check on him.   Patient was tachy earlier in the day and he was started on metoprolol.

## 2023-08-14 NOTE — TOC Benefit Eligibility Note (Signed)
Patient Product/process development scientist completed.    The patient is insured through [employer]. Patient has Medicare and is not eligible for a copay card, but may be able to apply for patient assistance, if available.    Ran test claim for Hydromorphone HCL 8 mg Tab 24 and Requires Prior Authorization   This test claim was processed through [facility]- copay amounts may vary at other pharmacies due to pharmacy/plan contracts, or as the patient moves through the different stages of their insurance plan.     Roland Earl, CPHT Pharmacy Technician III Certified Patient Advocate [facility] Patient Advocate Team Direct Number: [phone]  Fax: [phone]

## 2023-08-14 NOTE — Plan of Care (Signed)

## 2023-08-14 NOTE — Plan of Care (Signed)
  Problem: Skin Integrity: Goal: Risk for impaired skin integrity will decrease Outcome: Progressing   

## 2023-08-15 ENCOUNTER — Other Ambulatory Visit: Payer: Self-pay | Admitting: Primary Care

## 2023-08-15 DIAGNOSIS — E119 Type 2 diabetes mellitus without complications: Secondary | ICD-10-CM

## 2023-08-15 DIAGNOSIS — R531 Weakness: Secondary | ICD-10-CM | POA: Diagnosis not present

## 2023-08-15 LAB — GLUCOSE, CAPILLARY
Glucose-Capillary: 112 mg/dL — ABNORMAL HIGH (ref 70–99)
Glucose-Capillary: 253 mg/dL — ABNORMAL HIGH (ref 70–99)

## 2023-08-15 LAB — MAGNESIUM: Magnesium: 1.7 mg/dL (ref 1.7–2.4)

## 2023-08-15 MED ORDER — MORPHINE SULFATE ER 30 MG PO TBCR: 30.0000 mg | EXTENDED_RELEASE_TABLET | Freq: Two times a day (BID) | ORAL | 0 refills | Status: AC

## 2023-08-15 MED ORDER — MIRTAZAPINE 15 MG PO TBDP: 7.5000 mg | ORAL_TABLET | Freq: Every day | ORAL | 1 refills | Status: DC

## 2023-08-15 MED ORDER — MORPHINE SULFATE (CONCENTRATE) 10 MG/0.5ML PO SOLN: 10.0000 mg | ORAL | 0 refills | Status: AC | PRN

## 2023-08-15 NOTE — Plan of Care (Signed)
  Problem: Education: Goal: Individualized Educational Video(s) Outcome: Completed/Met   Problem: Coping: Goal: Ability to adjust to condition or change in health will improve Outcome: Progressing   Problem: Fluid Volume: Goal: Ability to maintain a balanced intake and output will improve Outcome: Completed/Met   Problem: Health Behavior/Discharge Planning: Goal: Ability to identify and utilize available resources and services will improve Outcome: Progressing Goal: Ability to manage health-related needs will improve Outcome: Progressing   Problem: Tissue Perfusion: Goal: Adequacy of tissue perfusion will improve Outcome: Completed/Met   Problem: Education: Goal: Knowledge of General Education information will improve Description: Including pain rating scale, medication(s)/side effects and non-pharmacologic comfort measures Outcome: Adequate for Discharge   Problem: Health Behavior/Discharge Planning: Goal: Ability to manage health-related needs will improve Outcome: Progressing   Problem: Clinical Measurements: Goal: Ability to maintain clinical measurements within normal limits will improve Outcome: Adequate for Discharge Goal: Will remain free from infection Outcome: Progressing Goal: Diagnostic test results will improve Outcome: Progressing Goal: Respiratory complications will improve Outcome: Progressing Goal: Cardiovascular complication will be avoided Outcome: Progressing   Problem: Activity: Goal: Risk for activity intolerance will decrease Outcome: Adequate for Discharge   Problem: Nutrition: Goal: Adequate nutrition will be maintained Outcome: Completed/Met   Problem: Coping: Goal: Level of anxiety will decrease Outcome: Progressing   Problem: Elimination: Goal: Will not experience complications related to bowel motility Outcome: Completed/Met Goal: Will not experience complications related to urinary retention Outcome: Completed/Met   Problem:  Pain Managment: Goal: General experience of comfort will improve Outcome: Progressing   Problem: Safety: Goal: Ability to remain free from injury will improve Outcome: Progressing   Problem: Skin Integrity: Goal: Risk for impaired skin integrity will decrease Outcome: Adequate for Discharge

## 2023-08-15 NOTE — Progress Notes (Signed)
Mobility Specialist - Progress Note   08/15/23 1022  Oxygen Therapy  O2 Device Room Air  Patient Activity (if Appropriate) Ambulating  Mobility  Activity Ambulated with assistance in hallway  Level of Assistance Standby assist, set-up cues, supervision of patient - no hands on  Assistive Device Front wheel walker  Distance Ambulated (ft) 350 ft  Activity Response Tolerated well  Mobility Referral Yes  $Mobility charge 1 Mobility  Mobility Specialist Start Time (ACUTE ONLY) 1005  Mobility Specialist Stop Time (ACUTE ONLY) 1020  Mobility Specialist Time Calculation (min) (ACUTE ONLY) 15 min   Pt received in bed and agreeable to mobility. C/o lower back "pinching" but still eager to ambulate. Pt to recliner after session with all needs met.    During mobility: 120 HR Post-mobility: 112 HR  [facility] Paediatric nurse

## 2023-08-15 NOTE — Discharge Summary (Signed)
Physician Discharge Summary  Javarious Huard HQI:[id number] DOB: Apr 15, 1951 DOA: 08/10/2023  PCP: Doreene Nest, NP  Admit date: 08/10/2023  Discharge date: 08/15/2023  Admitted From: Home.  Disposition:  Home health services.  Recommendations for Outpatient Follow-up:  Follow up with PCP in 1-2 weeks Please obtain BMP/CBC in one week Advised to take MS Contin 30 mg every 12 hours. Advised to take morphine sulfate 10 mg/ 0.5 mL solution, take 0.5 mL every 4 hours as needed for breakthrough pain. Continue Remeron 7.5 mg daily.  Home Health:Home PT/OT Equipment/Devices:None  Discharge Condition: Stable CODE STATUS:Full code Diet recommendation: Heart Healthy  Brief [facility] Course: This 72 yrs old Male with PMH significant of SCLC, on palliative chemotherapy, chronic back pain, COPD, HTN, HLD, OSA on CPAP, type II DM, obesity presented to the hospital with complaints of back pain and generalized weakness. He had poor p.o. intake for last few days as well. He went to the to the bathroom, reportedly had a fall but unable to stand up. Currently being treated for AKI, hypomagnesemia, hypokalemia, acute on chronic cancer related back pain and toxic encephalopathy secondary to Flexeril and gabapentin.  Palliative care consulted.  Pain medications adjusted.  Patient required PCA Dilaudid during hospitalization subsequently changed to oral pain regimen which is reasonable and pain is controlled.  Patient is being discharged home,  home health services arranged.  Discharge Diagnoses:  Principal Problem:   General weakness Active Problems:   Essential hypertension   Type 2 diabetes mellitus (HCC)   OSA (obstructive sleep apnea)   Hyperlipidemia associated with type 2 diabetes mellitus (HCC)   Small cell lung cancer, right upper lobe (HCC)   Chronic fatigue   History of pulmonary embolism   Chronic respiratory failure with hypoxia (HCC)   Weakness   Cancer related pain  Acute  kidney injury: Now resolved with IV hydration.   Hypokalemia /Hypomagnesemia: Resolved with IV hydration and replacement.   Generalized weakness and deconditioning: In the setting of hypotension. Now blood pressure has improved. PT and OT evaluation > HHS   Essential hypertension: On Norvasc and losartan PTA Blood pressure actually soft. Currently holding both medication.   Acute toxic encephalopathy: Patient became confused after receiving morphine, Flexeril and gabapentin. Discontinue Flexeril and gabapentin and monitor. ABG unremarkable.  No focal deficit.  Mild gastritis is seen. He seems back to his baseline mental status.   Midthoracic and surrounding pain. MRI: No evidence of metastatic disease in thoracic region. 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 Degenerative disc disease from C3-C4 and C6-C7 bilateral foraminal stenosis   Small cell lung cancer : Patient is getting  tarlatamab at [facility]. Per wife original plan was to transfer the patient to [facility] from ED. Currently no indication for higher level care. For now we will monitor closely. Discussed with Dr. Arbutus Ped.   Obesity; Placing the patient at high risk for poor outcome. Diet and exercise discussed in detail.   Chronic pain syndrome: Pain not controlled with MS Contin and breakthrough meds. Palliative care consulted for pain control. Patient started on Dilaudid PCA. Plan is to transition to oral pain regimen before discharge.    Discharge Instructions  Discharge Instructions     Call MD for:  difficulty breathing, headache or visual disturbances   Complete by: As directed    Call MD for:  persistant dizziness or light-headedness   Complete by: As directed    Call MD for:  persistant nausea  and vomiting   Complete by: As directed    Diet - low sodium heart healthy   Complete by: As directed    Diet Carb Modified   Complete by: As directed    Discharge  instructions   Complete by: As directed    Advised to follow-up with primary care physician in 1 week. Advised to follow-up with oncologist as per schedule. Advised to take MS Contin 30 mg every 12 hours. Advised to take morphine sulfate 10 mg and 0.5 mL solution, take 0.5 mL every 4 hours as needed for breakthrough pain. Continue Remeron 7.5 mg daily.   Increase activity slowly   Complete by: As directed       Allergies as of 08/15/2023       Reactions   Bupropion Other (See Comments)   Racing heart   Hydrochlorothiazide Other (See Comments)   Cramping to lower extremities        Medication List     STOP taking these medications    albuterol 108 (90 Base) MCG/ACT inhaler Commonly known as: VENTOLIN HFA   morphine 15 MG tablet Commonly known as: MSIR Replaced by: morphine 30 MG 12 hr tablet   oxyCODONE-acetaminophen 7.5-325 MG tablet Commonly known as: PERCOCET       TAKE these medications    amLODipine 10 MG tablet Commonly known as: NORVASC TAKE 1 TABLET BY MOUTH EVERY DAY FOR BLOOD PRESSURE What changed: See the new instructions.   B-D 3CC LUER-LOK SYR 22GX1" 22G X 1" 3 ML Misc Generic drug: SYRINGE-NEEDLE (DISP) 3 ML USE AS INSTRUCTED FOR TESTOSTERONE INJECTION EVERY 2 WEEKS   Basaglar KwikPen 100 UNIT/ML INJECT 24 UNITS INTO THE SKIN AT BEDTIME. FOR DIABETES. What changed:  how much to take when to take this additional instructions   BD Pen Needle Nano U/F 32G X 4 MM Misc Generic drug: Insulin Pen Needle Use with insulin as prescribed Dx Code: E11.9   blood glucose meter kit and supplies Kit Dispense based on patient and insurance preference. Use up to four times daily as directed.   CoQ10 200 MG Caps Take 200 mg by mouth at bedtime.   cyclobenzaprine 5 MG tablet Commonly known as: FLEXERIL TAKE 1 TABLET BY MOUTH AT BEDTIME AS NEEDED FOR MUSCLE SPASMS.   docusate sodium 100 MG capsule Commonly known as: COLACE Take 1 capsule (100 mg  total) by mouth every 12 (twelve) hours.   Eliquis 5 MG Tabs tablet Generic drug: apixaban TAKE 1 TABLET BY MOUTH TWICE A DAY What changed: how much to take   esomeprazole 20 MG capsule Commonly known as: NEXIUM Take 20 mg by mouth in the morning.   gabapentin 300 MG capsule Commonly known as: NEURONTIN TAKE 1 CAPSULE (300 MG TOTAL) BY MOUTH 2 (TWO) TIMES DAILY. FOR BACK PAIN.   glipiZIDE 5 MG tablet Commonly known as: GLUCOTROL Take 5 mg by mouth 2 (two) times daily before a meal.   ipratropium-albuterol 0.5-2.5 (3) MG/3ML Soln Commonly known as: DUONEB Take 3 mLs by nebulization every 4 (four) hours while awake for 3 days, THEN 3 mLs every 4 (four) hours as needed (shortness of breath or wheezing). Start taking on: October 13, 2021   KRILL OIL PO Take 1 capsule by mouth daily.   Lancets Misc USE UP TO 3 TIMES DAILY AS DIRECTED   lidocaine 5 % Commonly known as: Lidoderm Place 1 patch onto the skin daily. Remove & Discard patch within 12 hours or as directed by MD  losartan 50 MG tablet Commonly known as: COZAAR TAKE 1 TABLET (50 MG TOTAL) BY MOUTH DAILY. FOR BLOOD PRESSURE.   Melatonin 10 MG Caps Take 10 mg by mouth at bedtime as needed (for sleep).   metFORMIN 1000 MG tablet Commonly known as: GLUCOPHAGE TAKE 1 TABLET (1,000 MG TOTAL) BY MOUTH 2 (TWO) TIMES DAILY WITH A MEAL. FOR DIABETES   mirtazapine 15 MG disintegrating tablet Commonly known as: REMERON SOL-TAB Take 0.5 tablets (7.5 mg total) by mouth at bedtime.   morphine 30 MG 12 hr tablet Commonly known as: MS CONTIN Take 1 tablet (30 mg total) by mouth every 12 (twelve) hours for 1 day. Replaces: morphine 15 MG tablet   morphine CONCENTRATE 10 MG/0.5ML Soln concentrated solution Take 0.5 mLs (10 mg total) by mouth every 4 (four) hours as needed for up to 5 days for moderate pain, severe pain, anxiety or shortness of breath (breakthrough pain).   Multi-Vitamins Tabs Take 1 tablet by mouth daily  with breakfast.   ondansetron 4 MG tablet Commonly known as: ZOFRAN Take 1 tablet (4 mg total) by mouth every 6 (six) hours.   OneTouch Ultra test strip Generic drug: glucose blood USE UP TO 4 TIMES DAILY AS DIRECTED   pioglitazone 45 MG tablet Commonly known as: ACTOS TAKE 1 TABLET (45 MG TOTAL) BY MOUTH DAILY. FOR DIABETES.   pravastatin 40 MG tablet Commonly known as: PRAVACHOL TAKE 1 TABLET BY MOUTH EVERY DAY IN THE EVENING FOR CHOLESTEROL   PRESCRIPTION MEDICATION CPAP- At bedtime   testosterone cypionate 200 MG/ML injection Commonly known as: DEPOTESTOSTERONE CYPIONATE Inject 200 mg into the muscle every 14 (fourteen) days.   TURMERIC COMPLEX/BLACK PEPPER PO Take 1 tablet by mouth in the morning and at bedtime.        Follow-up Information     [facility] Follow up.   Why: [facility] will provide PT and OT in the home after discharge.        Doreene Nest, NP Follow up in 1 week(s).   Specialty: Internal Medicine Contact information: [address] [place] [address] [phone]         Si Gaul, MD Follow up in 1 week(s).   Specialty: Oncology Contact information: [address] [place] [address] [phone]                Allergies  Allergen Reactions   Bupropion Other (See Comments)    Racing heart   Hydrochlorothiazide Other (See Comments)    Cramping to lower extremities    Consultations: Palliative care   Procedures/Studies: ECHOCARDIOGRAM COMPLETE  Result Date: 08/12/2023    ECHOCARDIOGRAM REPORT   Patient Name:   [facility] Date of Exam: 08/12/2023 Medical Rec #:  [id number]    Height:       69.0 in Accession #:    [id number]   Weight:       283.0 lb Date of Birth:  [birth year]    BSA:          2.394 m Patient Age:    72 years     BP:           151/128 mmHg Patient Gender: M            HR:           106 bpm. Exam Location:  Inpatient Procedure: 2D Echo, Cardiac Doppler, Color  Doppler and Intracardiac  Opacification Agent Indications:    Congestive Heart Failure I50.9  History:        Patient has prior history of Echocardiogram examinations, most                 recent 10/12/2021. COPD; Risk Factors:Dyslipidemia, Diabetes and                 Hypertension.  Sonographer:    Harriette Bouillon RDCS Referring Phys: [id number] PRANAV M PATEL IMPRESSIONS  1. Technically difficult study. Left ventricular ejection fraction, by estimation, is 60 to 65%. The left ventricle has normal function. The left ventricle has no regional wall motion abnormalities. There is mild left ventricular hypertrophy. Left ventricular diastolic parameters are indeterminate.  2. Right ventricular systolic function was not well visualized. The right ventricular size is not well visualized.  3. The mitral valve is grossly normal. No evidence of mitral valve regurgitation.  4. The aortic valve was not well visualized. Aortic valve regurgitation is not visualized. No aortic stenosis is present.  5. Aortic dilatation noted. There is dilatation of the aortic root, measuring 41 mm. There is dilatation of the ascending aorta, measuring 43 mm. FINDINGS  Left Ventricle: Left ventricular ejection fraction, by estimation, is 60 to 65%. The left ventricle has normal function. The left ventricle has no regional wall motion abnormalities. Definity contrast agent was given IV to delineate the left ventricular  endocardial borders. The left ventricular internal cavity size was normal in size. There is mild left ventricular hypertrophy. Left ventricular diastolic parameters are indeterminate. Right Ventricle: The right ventricular size is not well visualized. Right vetricular wall thickness was not well visualized. Right ventricular systolic function was not well visualized. Left Atrium: Left atrial size was not well visualized. Right Atrium: Right atrial size was not well visualized. Pericardium: Trivial pericardial effusion is  present. Mitral Valve: The mitral valve is grossly normal. No evidence of mitral valve regurgitation. Tricuspid Valve: The tricuspid valve is normal in structure. Tricuspid valve regurgitation is trivial. Aortic Valve: The aortic valve was not well visualized. Aortic valve regurgitation is not visualized. No aortic stenosis is present. Pulmonic Valve: The pulmonic valve was not well visualized. Pulmonic valve regurgitation is trivial. Aorta: Aortic dilatation noted. There is dilatation of the aortic root, measuring 41 mm. There is dilatation of the ascending aorta, measuring 43 mm. IAS/Shunts: The interatrial septum was not well visualized.  LEFT VENTRICLE PLAX 2D LVIDd:         5.70 cm LVIDs:         4.40 cm LV PW:         1.10 cm LV IVS:        1.00 cm LVOT diam:     2.30 cm LV SV:         46 LV SV Index:   19 LVOT Area:     4.15 cm  LEFT ATRIUM         Index LA diam:    3.80 cm 1.59 cm/m  AORTIC VALVE LVOT Vmax:   72.60 cm/s LVOT Vmean:  48.700 cm/s LVOT VTI:    0.111 m  AORTA Ao Root diam: 4.10 cm Ao Asc diam:  4.30 cm  SHUNTS Systemic VTI:  0.11 m Systemic Diam: 2.30 cm Epifanio Lesches MD Electronically signed by Epifanio Lesches MD Signature Date/Time: 08/12/2023/5:35:55 PM    Final    VAS [place] LOWER EXTREMITY VENOUS (DVT)  Result Date: 08/12/2023  Lower Venous DVT Study Patient Name:  [facility]  Date of Exam:   08/12/2023 Medical Rec #: [id number]     Accession #:    [id number] Date of Birth: Jun 10, 1951     Patient Gender: M Patient Age:   21 years Exam Location:  [facility] Procedure:      VAS [place] LOWER EXTREMITY VENOUS (DVT) Referring Phys: PRANAV PATEL --------------------------------------------------------------------------------  Indications: Edema.  Risk Factors: Cancer. Limitations: Poor ultrasound/tissue interface and body habitus. Comparison Study: No prior studies. Performing Technologist: Chanda Busing RVT  Examination Guidelines: A complete evaluation includes B-mode  imaging, spectral Doppler, color Doppler, and power Doppler as needed of all accessible portions of each vessel. Bilateral testing is considered an integral part of a complete examination. Limited examinations for reoccurring indications may be performed as noted. The reflux portion of the exam is performed with the patient in reverse Trendelenburg.  +---------+---------------+---------+-----------+----------+--------------+ RIGHT    CompressibilityPhasicitySpontaneityPropertiesThrombus Aging +---------+---------------+---------+-----------+----------+--------------+ CFV      Full           Yes      Yes                                 +---------+---------------+---------+-----------+----------+--------------+ SFJ      Full                                                        +---------+---------------+---------+-----------+----------+--------------+ FV Prox  Full                                                        +---------+---------------+---------+-----------+----------+--------------+ FV Mid   Full                                                        +---------+---------------+---------+-----------+----------+--------------+ FV Distal               Yes      Yes                                 +---------+---------------+---------+-----------+----------+--------------+ PFV      Full                                                        +---------+---------------+---------+-----------+----------+--------------+ POP      Full           Yes      Yes                                 +---------+---------------+---------+-----------+----------+--------------+ PTV      Full                                                        +---------+---------------+---------+-----------+----------+--------------+  PERO     Full                                                        +---------+---------------+---------+-----------+----------+--------------+    +---------+---------------+---------+-----------+----------+--------------+ LEFT     CompressibilityPhasicitySpontaneityPropertiesThrombus Aging +---------+---------------+---------+-----------+----------+--------------+ CFV      Full           Yes      Yes                                 +---------+---------------+---------+-----------+----------+--------------+ SFJ      Full                                                        +---------+---------------+---------+-----------+----------+--------------+ FV Prox  Full                                                        +---------+---------------+---------+-----------+----------+--------------+ FV Mid   Full                                                        +---------+---------------+---------+-----------+----------+--------------+ FV DistalFull                                                        +---------+---------------+---------+-----------+----------+--------------+ PFV      Full                                                        +---------+---------------+---------+-----------+----------+--------------+ POP      Full           Yes      Yes                                 +---------+---------------+---------+-----------+----------+--------------+ PTV      Full                                                        +---------+---------------+---------+-----------+----------+--------------+ PERO     Full                                                        +---------+---------------+---------+-----------+----------+--------------+  Summary: RIGHT: - There is no evidence of deep vein thrombosis in the lower extremity. However, portions of this examination were limited- see technologist comments above.  - No cystic structure found in the popliteal fossa.  LEFT: - There is no evidence of deep vein thrombosis in the lower extremity. However, portions of this examination were  limited- see technologist comments above.  - No cystic structure found in the popliteal fossa.  *See table(s) above for measurements and observations. Electronically signed by Sherald Hess MD on 08/12/2023 at 1:11:52 PM.    Final    MR THORACIC SPINE WO CONTRAST  Result Date: 08/11/2023 CLINICAL DATA:  Metastatic small cell lung cancer. EXAM: MRI THORACIC SPINE WITHOUT CONTRAST TECHNIQUE: Multiplanar, multisequence MR imaging of the thoracic spine was performed. No intravenous contrast was administered. COMPARISON:  CT 05/19/2023 FINDINGS: Alignment:  Mild lower thoracic kyphotic curvature. Vertebrae: No definable metastatic disease in the region. No regional fracture. Degenerative type marrow edema from T4-T11 could relate to regional back pain. Cord: Canal stenosis at T8-9 and T9-10 as described below. Thoracic cord appears normal at the other levels. Paraspinal and other soft tissues: Negative Disc levels: Non-compressive degenerative disc disease and degenerative facet disease from T1-2 through T7-8. Findings could relate to back pain. T8-9: Endplate osteophytes and shallow protrusion of the disc. Fluid intensity material in the disc space, likely degenerative. Bilateral facet arthropathy with joint effusions and hypertrophic change. Spinal stenosis with effacement of the subarachnoid space surrounding cord. Slight indentation of the cord. T9-10: Endplate osteophytes and bulging of the disc. Bilateral facet arthropathy with joint effusions and hypertrophy. Similar findings of spinal stenosis with effacement of the subarachnoid space surrounding the cord and slight indentation of the cord. T10-11 through T12-L1: Mild non-compressive degenerative changes. IMPRESSION: 1. No evidence of metastatic disease in the thoracic region. 2. Degenerative disc disease and degenerative facet disease throughout the thoracic region, most severe at T8-9 and T9-10 where there is spinal stenosis with effacement of the  subarachnoid space surrounding the cord and slight indentation of the cord. No abnormal cord signal. The findings could certainly relate to back pain. 3. Degenerative type marrow edema from T4-T11 which could relate to regional back pain. Electronically Signed   By: Paulina Fusi M.D.   On: 08/11/2023 19:28   MR CERVICAL SPINE WO CONTRAST  Result Date: 08/11/2023 CLINICAL DATA:  Metastatic disease evaluation. Lung cancer, metastatic small cell. EXAM: MRI CERVICAL SPINE WITHOUT CONTRAST TECHNIQUE: Multiplanar, multisequence MR imaging of the cervical spine was performed. No intravenous contrast was administered. COMPARISON:  None Available. FINDINGS: Alignment: Straightening of the normal cervical lordosis. 2 mm degenerative anterolisthesis at C7-T1. Vertebrae: No fracture or focal bone lesion. Cord: No cord compression or focal cord lesion. Posterior Fossa, vertebral arteries, paraspinal tissues: Negative Disc levels: The foramen magnum is widely patent. There is ordinary mild osteoarthritis of the C1-2 articulation but no encroachment upon the neural structures. C2-3: Left-sided uncovertebral prominence. Mild left foraminal narrowing. C3-4: Endplate osteophytes and bulging of the disc. Bilateral facet hypertrophy. Narrowing of the canal with AP diameter in the midline 9.7 mm. No cord compression. Bilateral foraminal stenosis left worse than right. Either C4 nerve could be compressed, more likely the left. C4-5: Endplate osteophytes and bulging of the disc. Uncovertebral hypertrophy worse on the left. Left-sided facet degeneration. Bilateral foraminal stenosis, worse on the left. Either C5 nerve could be affected, more likely the left. C5-6: Endplate osteophytes and bulging of the disc. Bilateral foraminal stenosis could affect either C6  nerve. C6-7: Endplate osteophytes and bulging of the disc. Bilateral foraminal stenosis could affect either C7 nerve. C7-T1: Facet osteoarthritis with 2 mm of anterolisthesis. No  compressive narrowing of the canal or foramina. IMPRESSION: 1. No evidence of metastatic disease. 2. Degenerative spondylosis from C3-4 through C6-7. No compressive canal stenosis. Bilateral foraminal stenosis that could cause neural compression from C3-4 through C6-7. 3. Facet osteoarthritis at C7-T1 with 2 mm of anterolisthesis but no compressive stenosis. Electronically Signed   By: Paulina Fusi M.D.   On: 08/11/2023 19:24   DG Chest Port 1 View  Result Date: 08/10/2023 CLINICAL DATA:  History of lung cancer. EXAM: PORTABLE CHEST 1 VIEW COMPARISON:  Chest radiograph 09/18/2022, CT chest 05/19/2023 FINDINGS: The right chest wall port is stable in position. The cardiomediastinal silhouette is grossly stable, with unchanged cardiomegaly. Confluent opacity in the right hilar/suprahilar region is unchanged compared to the prior radiograph. There is no new or worsening focal airspace disease. Blunting of the right costophrenic angle is consistent with a small pleural effusion as seen on prior CT. There is no significant left effusion. There is no pneumothorax There is no acute osseous abnormality. IMPRESSION: 1. Confluent opacity in the right hilar/suprahilar region is unchanged, again most consistent with radiation change. 2. Blunting of the right costophrenic angle consistent with a small pleural effusion as seen on prior CT. 3. No new acute airspace opacity. Electronically Signed   By: Lesia Hausen M.D.   On: 08/10/2023 18:37   CT ABDOMEN PELVIS W CONTRAST  Result Date: 08/02/2023 CLINICAL DATA:  History of lung cancer. Patient presents with acute left upper quadrant pain and pain under left rib cage. * Tracking Code: BO *. EXAM: CT ABDOMEN AND PELVIS WITH CONTRAST TECHNIQUE: Multidetector CT imaging of the abdomen and pelvis was performed using the standard protocol following bolus administration of intravenous contrast. RADIATION DOSE REDUCTION: This exam was performed according to the departmental  dose-optimization program which includes automated exposure control, adjustment of the mA and/or kV according to patient size and/or use of iterative reconstruction technique. CONTRAST:  OMNIPAQUE IOHEXOL 300 MG/ML  SOLN COMPARISON:  05/01/2023 FINDINGS: Lower chest: High attenuation pleural thickening and small volume loculated fluid overlies the right lung base. Fibrosis and perihilar masslike architectural distortion is noted within the right lung compatible with changes due to external beam radiation. No signs of acute airspace consolidation. Hepatobiliary: Hypodense lesion within lateral dome of the right lobe of liver measures 5.2 x 4.3 cm by 3.0 cm (volume = 35 cm^3), image 19/4. On the previous exam this lesion measured 4.6 by 2.6 by 2.6 cm (volume = 16 cm^3) by my measurements. Hypodense lesion involving segments 5 and 4B measures 1.9 x 1.9 cm, image 33/4. On the previous exam this measured 3.3 x 2.3 cm. Small low-density lesion within the left hepatic lobe measures 7 mm, image 7, image 23/4. Previously 6 mm. Index lesion within caudate lobe measures 2.2 x 1.3 cm, image 29/4. Previously 2.0 x 1.3 cm. Gallbladder appears normal without bile duct dilatation. Pancreas: Unremarkable. No pancreatic ductal dilatation or surrounding inflammatory changes. Spleen: Normal in size without focal abnormality. Adrenals/Urinary Tract: Normal left adrenal gland. Previously characterized benign right adrenal adenoma measures 1.8 cm and appears similar to previous exam. No follow-up imaging recommended. Multiple simple appearing right kidney cysts. The largest measures 6.8 cm. No follow-up imaging recommended. Kidneys are otherwise unremarkable without signs of nephrolithiasis or obstructive uropathy. Bladder normal. Stomach/Bowel: Stomach is within normal limits. No pathologic dilatation  of the large or the small bowel loops to suggest a bowel obstruction. No bowel wall thickening or inflammation. Vascular/Lymphatic:  Aortic atherosclerosis. Enlarged portacaval lymph node measures 3.3 by 1.9 cm, image 38/4. Formally this measured 2.4 x 1.8 cm. No pelvic or inguinal adenopathy. Reproductive: Prostate gland enlargement. Other: No ascites or focal fluid collections. New soft tissue stranding is identified around the umbilicus within the subcutaneous fat, image 77/4. Musculoskeletal: No acute or suspicious osseous findings. Thoracolumbar degenerative disc disease identified. IMPRESSION: 1. No acute findings within the abdomen or pelvis. 2. Interval increase in size of hypodense lesion within the lateral dome of the right lobe of liver. Other hypodense lesions within the liver are essentially unchanged to decreased in size. 3. Interval increase in size of portacaval adenopathy. 4. New soft tissue stranding is identified around the umbilicus within the subcutaneous fat. This is nonspecific and may represent sequelae of recent trauma or cellulitis. 5. Stable benign right adrenal adenoma. No follow-up imaging recommended. 6. Prostate gland enlargement. 7. High attenuation pleural thickening and small volume loculated fluid overlies the right lung base, which may reflect prior pleurodesis. Fibrosis and perihilar masslike architectural distortion is noted within the right lung compatible with changes due to external beam radiation. 8.  Aortic Atherosclerosis (ICD10-I70.0). Electronically Signed   By: Signa Kell M.D.   On: 08/02/2023 14:53     Subjective: Patient was seen and examined at bedside.  Overnight events noted.   Patient reports doing much better,  pain is reasonably controlled, Home health services arranged.  Patient is being discharged home.  Discharge Exam: Vitals:   08/14/23 2104 08/15/23 0606  BP: 101/62 112/71  Pulse: 93 87  Resp: 16 16  Temp: 98.2 F (36.8 C) 98.1 F (36.7 C)  SpO2: 97% 97%   Vitals:   08/14/23 1852 08/14/23 1955 08/14/23 2104 08/15/23 0606  BP: 124/83  101/62 112/71  Pulse: 100 97  93 87  Resp: 17 18 16 16   Temp: 98.1 F (36.7 C)  98.2 F (36.8 C) 98.1 F (36.7 C)  TempSrc: Oral  Oral Oral  SpO2: 100% 99% 97% 97%  Weight:      Height:        General: Pt is alert, awake, not in acute distress Cardiovascular: RRR, S1/S2 +, no rubs, no gallops Respiratory: CTA bilaterally, no wheezing, no rhonchi Abdominal: Soft, NT, ND, bowel sounds + Extremities: no edema, no cyanosis    The results of significant diagnostics from this hospitalization (including imaging, microbiology, ancillary and laboratory) are listed below for reference.     Microbiology: No results found for this or any previous visit (from the past 240 hour(s)).   Labs: BNP (last 3 results) No results for input(s): "BNP" in the last 8760 hours. Basic Metabolic Panel: Recent Labs  Lab 08/11/23 0347 08/11/23 1709 08/12/23 0353 08/13/23 0347 08/14/23 0347 08/15/23 0344  NA 131* 132* 135 132* 136  --   K 3.4* 4.0 4.3 3.6 3.8  --   CL 97* 96* 97* 96* 98  --   CO2 27 26 26 25 28   --   GLUCOSE 123* 132* 148* 129* 129*  --   BUN 16 13 13 11 9   --   CREATININE 1.18 0.97 1.04 1.01 0.89  --   CALCIUM 7.3* 7.8* 8.1* 8.0* 8.3*  --   MG 1.3* 1.6* 1.7 1.4* 1.5* 1.7   Liver Function Tests: Recent Labs  Lab 08/10/23 1708  AST 64*  ALT 25  ALKPHOS  63  BILITOT 0.6  PROT 6.1*  ALBUMIN 2.8*   Recent Labs  Lab 08/10/23 1708  LIPASE 20   No results for input(s): "AMMONIA" in the last 168 hours. CBC: Recent Labs  Lab 08/10/23 1708 08/11/23 0347 08/12/23 0353 08/13/23 0347 08/14/23 0347  WBC 7.6 5.4 5.2 3.6* 3.4*  NEUTROABS 6.5 4.4  --   --   --   HGB 9.6* 8.6* 8.3* 8.8* 8.9*  HCT 31.3* 28.2* 27.0* 28.5* 29.4*  MCV 96.9 97.2 97.5 96.6 96.7  PLT 178 134* 151 141* 149*   Cardiac Enzymes: No results for input(s): "CKTOTAL", "CKMB", "CKMBINDEX", "TROPONINI" in the last 168 hours. BNP: Invalid input(s): "POCBNP" CBG: Recent Labs  Lab 08/14/23 0723 08/14/23 1138 08/14/23 1624  08/15/23 0742 08/15/23 1142  GLUCAP 138* 160* 152* 112* 253*   D-Dimer No results for input(s): "DDIMER" in the last 72 hours. Hgb A1c No results for input(s): "HGBA1C" in the last 72 hours. Lipid Profile No results for input(s): "CHOL", "HDL", "LDLCALC", "TRIG", "CHOLHDL", "LDLDIRECT" in the last 72 hours. Thyroid function studies No results for input(s): "TSH", "T4TOTAL", "T3FREE", "THYROIDAB" in the last 72 hours.  Invalid input(s): "FREET3" Anemia work up No results for input(s): "VITAMINB12", "FOLATE", "FERRITIN", "TIBC", "IRON", "RETICCTPCT" in the last 72 hours. Urinalysis    Component Value Date/Time   COLORURINE YELLOW 04/15/2023 1511   APPEARANCEUR CLEAR 04/15/2023 1511   LABSPEC 1.023 04/15/2023 1511   PHURINE 5.0 04/15/2023 1511   GLUCOSEU 50 (A) 04/15/2023 1511   GLUCOSEU 100 (A) 11/25/2017 1359   HGBUR NEGATIVE 04/15/2023 1511   BILIRUBINUR NEGATIVE 04/15/2023 1511   KETONESUR 5 (A) 04/15/2023 1511   PROTEINUR 30 (A) 04/15/2023 1511   UROBILINOGEN 0.2 11/25/2017 1359   NITRITE NEGATIVE 04/15/2023 1511   LEUKOCYTESUR NEGATIVE 04/15/2023 1511   Sepsis Labs Recent Labs  Lab 08/11/23 0347 08/12/23 0353 08/13/23 0347 08/14/23 0347  WBC 5.4 5.2 3.6* 3.4*   Microbiology No results found for this or any previous visit (from the past 240 hour(s)).   Time coordinating discharge: Over 30 minutes  SIGNED:   Willeen Niece, MD  [facility] 08/15/2023, 3:12 PM Pager   If 7PM-7AM, please contact night-coverage

## 2023-08-15 NOTE — Plan of Care (Signed)
  Problem: Education: Goal: Ability to describe self-care measures that may prevent or decrease complications (Diabetes Survival Skills Education) will improve Outcome: Progressing   Problem: Education: Goal: Knowledge of General Education information will improve Description: Including pain rating scale, medication(s)/side effects and non-pharmacologic comfort measures Outcome: Progressing   Problem: Pain Managment: Goal: General experience of comfort will improve Outcome: Progressing

## 2023-08-15 NOTE — Discharge Instructions (Signed)
Advised to take MS Contin 30 mg every 12 hours. Advised to take morphine sulfate 10 mg and 0.5 mL solution, take 0.5 mL every 4 hours as needed for breakthrough pain. Continue Remeron 7.5 mg daily.

## 2023-08-17 DIAGNOSIS — E669 Obesity, unspecified: Secondary | ICD-10-CM | POA: Diagnosis not present

## 2023-08-17 DIAGNOSIS — M5134 Other intervertebral disc degeneration, thoracic region: Secondary | ICD-10-CM | POA: Diagnosis not present

## 2023-08-17 DIAGNOSIS — J449 Chronic obstructive pulmonary disease, unspecified: Secondary | ICD-10-CM | POA: Diagnosis not present

## 2023-08-17 DIAGNOSIS — G928 Other toxic encephalopathy: Secondary | ICD-10-CM | POA: Diagnosis not present

## 2023-08-17 DIAGNOSIS — Z794 Long term (current) use of insulin: Secondary | ICD-10-CM | POA: Diagnosis not present

## 2023-08-17 DIAGNOSIS — Z6839 Body mass index (BMI) 39.0-39.9, adult: Secondary | ICD-10-CM | POA: Diagnosis not present

## 2023-08-17 DIAGNOSIS — M4804 Spinal stenosis, thoracic region: Secondary | ICD-10-CM | POA: Diagnosis not present

## 2023-08-17 DIAGNOSIS — Z7901 Long term (current) use of anticoagulants: Secondary | ICD-10-CM | POA: Diagnosis not present

## 2023-08-17 DIAGNOSIS — M5030 Other cervical disc degeneration, unspecified cervical region: Secondary | ICD-10-CM | POA: Diagnosis not present

## 2023-08-17 DIAGNOSIS — E785 Hyperlipidemia, unspecified: Secondary | ICD-10-CM | POA: Diagnosis not present

## 2023-08-17 DIAGNOSIS — M47812 Spondylosis without myelopathy or radiculopathy, cervical region: Secondary | ICD-10-CM | POA: Diagnosis not present

## 2023-08-17 DIAGNOSIS — J9621 Acute and chronic respiratory failure with hypoxia: Secondary | ICD-10-CM | POA: Diagnosis not present

## 2023-08-17 DIAGNOSIS — C3411 Malignant neoplasm of upper lobe, right bronchus or lung: Secondary | ICD-10-CM | POA: Diagnosis not present

## 2023-08-17 DIAGNOSIS — E1169 Type 2 diabetes mellitus with other specified complication: Secondary | ICD-10-CM | POA: Diagnosis not present

## 2023-08-17 DIAGNOSIS — N400 Enlarged prostate without lower urinary tract symptoms: Secondary | ICD-10-CM | POA: Diagnosis not present

## 2023-08-17 DIAGNOSIS — Z7984 Long term (current) use of oral hypoglycemic drugs: Secondary | ICD-10-CM | POA: Diagnosis not present

## 2023-08-17 DIAGNOSIS — I700 Atherosclerosis of aorta: Secondary | ICD-10-CM | POA: Diagnosis not present

## 2023-08-17 DIAGNOSIS — I10 Essential (primary) hypertension: Secondary | ICD-10-CM | POA: Diagnosis not present

## 2023-08-17 DIAGNOSIS — M4802 Spinal stenosis, cervical region: Secondary | ICD-10-CM | POA: Diagnosis not present

## 2023-08-17 DIAGNOSIS — C787 Secondary malignant neoplasm of liver and intrahepatic bile duct: Secondary | ICD-10-CM | POA: Diagnosis not present

## 2023-08-17 DIAGNOSIS — G4733 Obstructive sleep apnea (adult) (pediatric): Secondary | ICD-10-CM | POA: Diagnosis not present

## 2023-08-17 DIAGNOSIS — E860 Dehydration: Secondary | ICD-10-CM | POA: Diagnosis not present

## 2023-08-17 DIAGNOSIS — G893 Neoplasm related pain (acute) (chronic): Secondary | ICD-10-CM | POA: Diagnosis not present

## 2023-08-17 DIAGNOSIS — Z86711 Personal history of pulmonary embolism: Secondary | ICD-10-CM | POA: Diagnosis not present

## 2023-08-17 DIAGNOSIS — Z9181 History of falling: Secondary | ICD-10-CM | POA: Diagnosis not present

## 2023-08-18 ENCOUNTER — Other Ambulatory Visit: Payer: Self-pay | Admitting: Primary Care

## 2023-08-18 ENCOUNTER — Telehealth: Payer: Self-pay

## 2023-08-18 ENCOUNTER — Telehealth: Payer: Self-pay | Admitting: Primary Care

## 2023-08-18 DIAGNOSIS — C3411 Malignant neoplasm of upper lobe, right bronchus or lung: Secondary | ICD-10-CM

## 2023-08-18 MED ORDER — ONDANSETRON HCL 4 MG PO TABS
4.0000 mg | ORAL_TABLET | Freq: Three times a day (TID) | ORAL | 0 refills | Status: DC | PRN
Start: 2023-08-18 — End: 2024-01-18

## 2023-08-18 NOTE — Telephone Encounter (Signed)
Home Health verbal orders Caller Name:Chris Agency Name: [facility]  Callback number: [phone]  Requesting OT/PT/Skilled nursing/Social Work/Speech: PT  Reason:recent hospitalization   Frequency:1x wk for 9 wks  Please forward to [facility] pool or providers CMA

## 2023-08-18 NOTE — Transitions of Care (Post Inpatient/ED Visit) (Signed)
08/18/2023  Name: Adam Wise MRN: [id number] DOB: May 27, 1951  Today's TOC FU Call Status: Today's TOC FU Call Status:: Successful TOC FU Call Completed TOC FU Call Complete Date: 08/18/23 Patient's Name and Date of Birth confirmed.  Transition Care Management Follow-up Telephone Call Date of Discharge: 08/15/23 Discharge Facility: [facility]) Type of Discharge: Inpatient Admission Primary Inpatient Discharge Diagnosis:: Generalized Weakness How have you been since you were released from the hospital?: Better Any questions or concerns?: Yes Patient Questions/Concerns:: I need more of the Zofran Patient Questions/Concerns Addressed: Notified Provider of Patient Questions/Concerns  Items Reviewed: Did you receive and understand the discharge instructions provided?: Yes Medications obtained,verified, and reconciled?: Yes (Medications Reviewed) Any new allergies since your discharge?: No Dietary orders reviewed?: No Do you have support at home?: Yes Name of Support/Comfort Primary Source: [name]  Medications Reviewed Today: Medications Reviewed Today     Reviewed by Jodelle Gross, RN (Case Manager) on 08/18/23 at 1619  Med List Status: <None>   Medication Order Taking? Sig Documenting Provider Last Dose Status Informant  amLODipine (NORVASC) 10 MG tablet [id number] Yes TAKE 1 TABLET BY MOUTH EVERY DAY FOR BLOOD PRESSURE  Patient taking differently: Take 10 mg by mouth daily.   Doreene Nest, NP Taking Active Self  B-D 3CC LUER-LOK SYR 22GX1" 22G X 1" 3 ML MISC [id number] Yes USE AS INSTRUCTED FOR TESTOSTERONE INJECTION EVERY 2 WEEKS Doreene Nest, NP Taking Active Self  Black Pepper-Turmeric (TURMERIC COMPLEX/BLACK PEPPER PO) [id number] Yes Take 1 tablet by mouth in the morning and at bedtime. [provider] Taking Active Self  blood glucose meter kit and supplies KIT [id number] Yes Dispense based on patient and insurance preference. Use up to four times  daily as directed. Doreene Nest, NP Taking Active Self  Coenzyme Q10 (COQ10) 200 MG CAPS [id number] Yes Take 200 mg by mouth at bedtime. [provider] Taking Active Self  cyclobenzaprine (FLEXERIL) 5 MG tablet [id number] Yes TAKE 1 TABLET BY MOUTH AT BEDTIME AS NEEDED FOR MUSCLE SPASMS. Doreene Nest, NP Taking Active Self  docusate sodium (COLACE) 100 MG capsule [id number] No Take 1 capsule (100 mg total) by mouth every 12 (twelve) hours.  Patient not taking: Reported on 08/10/2023   Arletha Pili, DO Not Taking Active Self  ELIQUIS 5 MG TABS tablet [id number] Yes TAKE 1 TABLET BY MOUTH TWICE A DAY  Patient taking differently: Take 5 mg by mouth 2 (two) times daily.   Heilingoetter, Cassandra L, PA-C Taking Active Self           Med Note Gildardo Griffes A   Mon Aug 10, 2023  9:12 PM) Patient state that it has been about a month since he last had med.  esomeprazole (NEXIUM) 20 MG capsule [id number] Yes Take 20 mg by mouth in the morning. [provider] Taking Active Self  gabapentin (NEURONTIN) 300 MG capsule [id number] Yes TAKE 1 CAPSULE (300 MG TOTAL) BY MOUTH 2 (TWO) TIMES DAILY. FOR BACK PAIN. Doreene Nest, NP Taking Active Self  glipiZIDE (GLUCOTROL) 5 MG tablet [id number] Yes Take 5 mg by mouth 2 (two) times daily before a meal. [provider] Taking Active Self  glucose blood (ONETOUCH ULTRA) test strip [id number] Yes USE UP TO 4 TIMES DAILY AS DIRECTED Doreene Nest, NP Taking Active Self  Insulin Glargine (BASAGLAR KWIKPEN) 100 UNIT/ML [id number] Yes INJECT 24 UNITS INTO THE SKIN AT BEDTIME. FOR DIABETES.  Patient taking differently: Inject 0-24 Units  into the skin See admin instructions. Inject 24 units into the skin at bedtime and PER SLIDING SCALE in the morning AS NEEDED FOR AN ELEVATED BGL, possibly caused by other prescribed medications   Doreene Nest, NP Taking Active Self  Insulin Pen Needle (BD PEN NEEDLE NANO U/F) 32G  X 4 MM MISC [id number] Yes Use with insulin as prescribed Dx Code: E11.9 Doreene Nest, NP Taking Active Self  ipratropium-albuterol (DUONEB) 0.5-2.5 (3) MG/3ML SOLN [id number] Yes Take 3 mLs by nebulization every 4 (four) hours while awake for 3 days, THEN 3 mLs every 4 (four) hours as needed (shortness of breath or wheezing). Narda Bonds, MD Taking Active Self  KRILL OIL PO [id number] Yes Take 1 capsule by mouth daily. [provider] Taking Active Self           Med Note Antony Madura, Arn Medal   Tue May 19, 2023  2:50 PM) Strength not noted  Lancets MISC [id number] Yes USE UP TO 3 TIMES DAILY AS DIRECTED Doreene Nest, NP Taking Active Self  lidocaine (LIDODERM) 5 % [id number] Yes Place 1 patch onto the skin daily. Remove & Discard patch within 12 hours or as directed by MD Kommor, Wyn Forster, MD Taking Active Self  losartan (COZAAR) 50 MG tablet [id number] Yes TAKE 1 TABLET (50 MG TOTAL) BY MOUTH DAILY. FOR BLOOD PRESSURE. Doreene Nest, NP Taking Active Self  Melatonin 10 MG CAPS [id number] Yes Take 10 mg by mouth at bedtime as needed (for sleep). [provider] Taking Active Self  metFORMIN (GLUCOPHAGE) 1000 MG tablet [id number] Yes TAKE 1 TABLET (1,000 MG TOTAL) BY MOUTH 2 (TWO) TIMES DAILY WITH A MEAL. FOR DIABETES Doreene Nest, NP Taking Active Self  mirtazapine (REMERON SOL-TAB) 15 MG disintegrating tablet [id number] Yes Take 0.5 tablets (7.5 mg total) by mouth at bedtime. Willeen Niece, MD Taking Active   Morphine Sulfate (MORPHINE CONCENTRATE) 10 MG/0.5ML SOLN concentrated solution [id number] No Take 0.5 mLs (10 mg total) by mouth every 4 (four) hours as needed for up to 5 days for moderate pain, severe pain, anxiety or shortness of breath (breakthrough pain).  Patient not taking: Reported on 08/18/2023   Willeen Niece, MD Not Taking Active            Med Note Electa Sniff [facility]   Tue Aug 18, 2023  4:19 PM) Patient has not started yet, delay with pharmacy  Multiple  Vitamin (MULTI-VITAMINS) TABS [id number] Yes Take 1 tablet by mouth daily with breakfast. [provider] Taking Active Self  ondansetron (ZOFRAN) 4 MG tablet [id number] Yes Take 1 tablet (4 mg total) by mouth every 6 (six) hours. Arletha Pili, DO Taking Active Self  pioglitazone (ACTOS) 45 MG tablet [id number] Yes TAKE 1 TABLET (45 MG TOTAL) BY MOUTH DAILY. FOR DIABETES. Doreene Nest, NP Taking Active Self  pravastatin (PRAVACHOL) 40 MG tablet [id number] Yes TAKE 1 TABLET BY MOUTH EVERY DAY IN THE EVENING FOR CHOLESTEROL Doreene Nest, NP Taking Active Self  PRESCRIPTION MEDICATION [id number] Yes CPAP- At bedtime [provider] Taking Active Self  testosterone cypionate (DEPOTESTOSTERONE CYPIONATE) 200 MG/ML injection [id number] Yes Inject 200 mg into the muscle every 14 (fourteen) days. [provider] Taking Active Self            Home Care and Equipment/Supplies: Were Home Health Services Ordered?: Yes Name of Home Health Agency:: [facility] Has Agency set up a time to come to your home?: Yes [facility]  Visit Date: 08/17/23 Any new equipment or medical supplies ordered?: No  Functional Questionnaire: Do you need assistance with bathing/showering or dressing?: No Do you need assistance with meal preparation?: No Do you need assistance with eating?: No Do you have difficulty maintaining continence: No Do you need assistance with getting out of bed/getting out of a chair/moving?: No Do you have difficulty managing or taking your medications?: No  Follow up appointments reviewed: PCP Follow-up appointment confirmed?: No MD Provider Line Number:[phone] Given:  (Patients wife to schedule appointment) Specialist Hospital Follow-up appointment confirmed?: No Reason Specialist Follow-Up Not Confirmed: Patient has Specialist Provider Number and will Call for Appointment Do you need transportation to your follow-up appointment?:  No Do you understand care options if your condition(s) worsen?: Yes-patient verbalized understanding  SDOH Interventions Today    Flowsheet Row Most Recent Value  SDOH Interventions   Food Insecurity Interventions Intervention Not Indicated  Housing Interventions Intervention Not Indicated  Transportation Interventions Intervention Not Indicated      TOC Interventions Today    Flowsheet Row Most Recent Value  TOC Interventions   TOC Interventions Discussed/Reviewed TOC Interventions Discussed, TOC Interventions Reviewed, Contacted provider for patient needs       Jodelle Gross RN, BSN, CCM [place]  Population Health RN Care Coordinator/ Transitions of Care Direct Dial: [phone]  Fax: [phone]

## 2023-08-18 NOTE — Telephone Encounter (Signed)
Noted.  Reviewed discharge summary notes.  Will defer any medication changes to oncology.

## 2023-08-18 NOTE — Telephone Encounter (Signed)
Approved.  

## 2023-08-19 ENCOUNTER — Telehealth: Payer: Self-pay

## 2023-08-19 ENCOUNTER — Other Ambulatory Visit: Payer: Self-pay | Admitting: Cardiology

## 2023-08-19 NOTE — Patient Outreach (Signed)
  Care Coordination   Note   08/19/2023 Name: [name] MRN: [id number] DOB: 21-Jun-1951  Adam Wise is a 72 y.o. year old male who sees Doreene Nest, NP for primary care. I reached out to patent after receiving message from Vernona Rieger, NP that she has sent in a refill for his Zofran. I spoke with patient's spouse and let her know the prescription should be ready to pick up.  She will relay the message to her husband. Jodelle Gross RN, BSN, CCM [facility] RN Care Coordinator/ Transitions of Care Direct Dial: [phone]  Fax: [phone]

## 2023-08-19 NOTE — Telephone Encounter (Signed)
Called and advised Thayer Ohm with [facility] of the approval of the requested verbal orders for this patient. Advised to call back with any further questions.

## 2023-08-20 ENCOUNTER — Ambulatory Visit: Payer: Medicare Other

## 2023-08-20 ENCOUNTER — Other Ambulatory Visit: Payer: Self-pay | Admitting: Primary Care

## 2023-08-20 ENCOUNTER — Telehealth: Payer: Self-pay | Admitting: Primary Care

## 2023-08-20 DIAGNOSIS — I10 Essential (primary) hypertension: Secondary | ICD-10-CM

## 2023-08-20 DIAGNOSIS — E1169 Type 2 diabetes mellitus with other specified complication: Secondary | ICD-10-CM

## 2023-08-20 DIAGNOSIS — J9621 Acute and chronic respiratory failure with hypoxia: Secondary | ICD-10-CM | POA: Diagnosis not present

## 2023-08-20 DIAGNOSIS — E785 Hyperlipidemia, unspecified: Secondary | ICD-10-CM | POA: Diagnosis not present

## 2023-08-20 DIAGNOSIS — C3411 Malignant neoplasm of upper lobe, right bronchus or lung: Secondary | ICD-10-CM | POA: Diagnosis not present

## 2023-08-20 DIAGNOSIS — G893 Neoplasm related pain (acute) (chronic): Secondary | ICD-10-CM | POA: Diagnosis not present

## 2023-08-20 DIAGNOSIS — J449 Chronic obstructive pulmonary disease, unspecified: Secondary | ICD-10-CM | POA: Diagnosis not present

## 2023-08-20 NOTE — Telephone Encounter (Signed)
Patient's wife contacted the office regarding medication Morphine Sulfate (MORPHINE CONCENTRATE) 10 MG/0.5ML SOLN concentrated solution. Patient was prescribed this upon discharge from hospital for pain, patient's wife was wondering if there is a way that Adam Wise could send this medication in for the patient rather than the provider he saw in the hospital? Pharmacy has notified patient's wife that there is an issue with the way it was prescribed, was sent as half bottle rather than full and pharmacy is not able to do half bottles. Pt's wife wanted to know if Adam Wise could help with this by sending to preferred pharmacy?

## 2023-08-21 ENCOUNTER — Other Ambulatory Visit: Payer: Self-pay | Admitting: Primary Care

## 2023-08-21 DIAGNOSIS — E785 Hyperlipidemia, unspecified: Secondary | ICD-10-CM

## 2023-08-21 NOTE — Telephone Encounter (Signed)
Please call patient's wife:  I do not have experience prescribing this, but I'm sure his oncologist could. Recommend they contact his oncologist.

## 2023-08-21 NOTE — Telephone Encounter (Signed)
Called patients wife and reviewed all information. She verbalized understanding. Will call if any further questions.  

## 2023-08-24 ENCOUNTER — Telehealth: Payer: Self-pay | Admitting: Primary Care

## 2023-08-24 ENCOUNTER — Ambulatory Visit: Payer: Medicare Other

## 2023-08-24 NOTE — Telephone Encounter (Signed)
Misty Stanley from [facility] called in and stated that she would like verbal orders too see patient for  occupational therapist  one time a week for eights weeks if any questions she can be reached at [phone].

## 2023-08-24 NOTE — Telephone Encounter (Signed)
Approved.  

## 2023-08-25 ENCOUNTER — Encounter: Payer: Self-pay | Admitting: Internal Medicine

## 2023-08-25 DIAGNOSIS — E119 Type 2 diabetes mellitus without complications: Secondary | ICD-10-CM | POA: Diagnosis not present

## 2023-08-25 DIAGNOSIS — C3490 Malignant neoplasm of unspecified part of unspecified bronchus or lung: Secondary | ICD-10-CM | POA: Diagnosis not present

## 2023-08-25 DIAGNOSIS — M549 Dorsalgia, unspecified: Secondary | ICD-10-CM | POA: Diagnosis not present

## 2023-08-25 DIAGNOSIS — G8929 Other chronic pain: Secondary | ICD-10-CM | POA: Diagnosis not present

## 2023-08-25 DIAGNOSIS — Z7984 Long term (current) use of oral hypoglycemic drugs: Secondary | ICD-10-CM | POA: Diagnosis not present

## 2023-08-25 DIAGNOSIS — Z79899 Other long term (current) drug therapy: Secondary | ICD-10-CM | POA: Diagnosis not present

## 2023-08-25 DIAGNOSIS — I10 Essential (primary) hypertension: Secondary | ICD-10-CM | POA: Diagnosis not present

## 2023-08-25 DIAGNOSIS — C787 Secondary malignant neoplasm of liver and intrahepatic bile duct: Secondary | ICD-10-CM | POA: Diagnosis not present

## 2023-08-25 DIAGNOSIS — G4733 Obstructive sleep apnea (adult) (pediatric): Secondary | ICD-10-CM | POA: Diagnosis not present

## 2023-08-25 DIAGNOSIS — Z5111 Encounter for antineoplastic chemotherapy: Secondary | ICD-10-CM | POA: Diagnosis not present

## 2023-08-25 DIAGNOSIS — Z888 Allergy status to other drugs, medicaments and biological substances status: Secondary | ICD-10-CM | POA: Diagnosis not present

## 2023-08-25 DIAGNOSIS — Z7901 Long term (current) use of anticoagulants: Secondary | ICD-10-CM | POA: Diagnosis not present

## 2023-08-25 DIAGNOSIS — Z9989 Dependence on other enabling machines and devices: Secondary | ICD-10-CM | POA: Diagnosis not present

## 2023-08-25 DIAGNOSIS — R590 Localized enlarged lymph nodes: Secondary | ICD-10-CM | POA: Diagnosis not present

## 2023-08-25 DIAGNOSIS — C3411 Malignant neoplasm of upper lobe, right bronchus or lung: Secondary | ICD-10-CM | POA: Diagnosis not present

## 2023-08-25 DIAGNOSIS — J8410 Pulmonary fibrosis, unspecified: Secondary | ICD-10-CM | POA: Diagnosis not present

## 2023-08-25 NOTE — Telephone Encounter (Signed)
Called and advised Misty Stanley with [facility] advised of the approval of the requested verbal orders for this patient. Advised to call back with any further questions.

## 2023-08-26 ENCOUNTER — Encounter: Payer: Self-pay | Admitting: Medical Oncology

## 2023-08-27 ENCOUNTER — Ambulatory Visit: Payer: Medicare Other

## 2023-08-28 DIAGNOSIS — C3411 Malignant neoplasm of upper lobe, right bronchus or lung: Secondary | ICD-10-CM | POA: Diagnosis not present

## 2023-08-28 DIAGNOSIS — G893 Neoplasm related pain (acute) (chronic): Secondary | ICD-10-CM | POA: Diagnosis not present

## 2023-08-28 DIAGNOSIS — E1169 Type 2 diabetes mellitus with other specified complication: Secondary | ICD-10-CM | POA: Diagnosis not present

## 2023-08-28 DIAGNOSIS — J449 Chronic obstructive pulmonary disease, unspecified: Secondary | ICD-10-CM | POA: Diagnosis not present

## 2023-08-28 DIAGNOSIS — J9621 Acute and chronic respiratory failure with hypoxia: Secondary | ICD-10-CM | POA: Diagnosis not present

## 2023-08-28 DIAGNOSIS — E785 Hyperlipidemia, unspecified: Secondary | ICD-10-CM | POA: Diagnosis not present

## 2023-08-31 ENCOUNTER — Ambulatory Visit: Payer: Medicare Other

## 2023-08-31 DIAGNOSIS — E785 Hyperlipidemia, unspecified: Secondary | ICD-10-CM | POA: Diagnosis not present

## 2023-08-31 DIAGNOSIS — E1169 Type 2 diabetes mellitus with other specified complication: Secondary | ICD-10-CM | POA: Diagnosis not present

## 2023-08-31 DIAGNOSIS — J449 Chronic obstructive pulmonary disease, unspecified: Secondary | ICD-10-CM | POA: Diagnosis not present

## 2023-08-31 DIAGNOSIS — G893 Neoplasm related pain (acute) (chronic): Secondary | ICD-10-CM | POA: Diagnosis not present

## 2023-08-31 DIAGNOSIS — C3411 Malignant neoplasm of upper lobe, right bronchus or lung: Secondary | ICD-10-CM | POA: Diagnosis not present

## 2023-08-31 DIAGNOSIS — J9621 Acute and chronic respiratory failure with hypoxia: Secondary | ICD-10-CM | POA: Diagnosis not present

## 2023-09-03 ENCOUNTER — Ambulatory Visit: Payer: Medicare Other

## 2023-09-03 DIAGNOSIS — E785 Hyperlipidemia, unspecified: Secondary | ICD-10-CM | POA: Diagnosis not present

## 2023-09-03 DIAGNOSIS — C3411 Malignant neoplasm of upper lobe, right bronchus or lung: Secondary | ICD-10-CM | POA: Diagnosis not present

## 2023-09-03 DIAGNOSIS — G893 Neoplasm related pain (acute) (chronic): Secondary | ICD-10-CM | POA: Diagnosis not present

## 2023-09-03 DIAGNOSIS — J9621 Acute and chronic respiratory failure with hypoxia: Secondary | ICD-10-CM | POA: Diagnosis not present

## 2023-09-03 DIAGNOSIS — E1169 Type 2 diabetes mellitus with other specified complication: Secondary | ICD-10-CM | POA: Diagnosis not present

## 2023-09-03 DIAGNOSIS — J449 Chronic obstructive pulmonary disease, unspecified: Secondary | ICD-10-CM | POA: Diagnosis not present

## 2023-09-07 ENCOUNTER — Ambulatory Visit: Payer: Medicare Other

## 2023-09-07 DIAGNOSIS — M545 Low back pain, unspecified: Secondary | ICD-10-CM | POA: Diagnosis not present

## 2023-09-07 DIAGNOSIS — Z515 Encounter for palliative care: Secondary | ICD-10-CM | POA: Diagnosis not present

## 2023-09-07 DIAGNOSIS — R1011 Right upper quadrant pain: Secondary | ICD-10-CM | POA: Diagnosis not present

## 2023-09-07 DIAGNOSIS — C3411 Malignant neoplasm of upper lobe, right bronchus or lung: Secondary | ICD-10-CM | POA: Diagnosis not present

## 2023-09-07 DIAGNOSIS — G8929 Other chronic pain: Secondary | ICD-10-CM | POA: Diagnosis not present

## 2023-09-07 DIAGNOSIS — Z7189 Other specified counseling: Secondary | ICD-10-CM | POA: Diagnosis not present

## 2023-09-07 DIAGNOSIS — G893 Neoplasm related pain (acute) (chronic): Secondary | ICD-10-CM | POA: Diagnosis not present

## 2023-09-08 DIAGNOSIS — C3411 Malignant neoplasm of upper lobe, right bronchus or lung: Secondary | ICD-10-CM | POA: Diagnosis not present

## 2023-09-10 ENCOUNTER — Ambulatory Visit: Payer: Medicare Other

## 2023-09-10 DIAGNOSIS — C3411 Malignant neoplasm of upper lobe, right bronchus or lung: Secondary | ICD-10-CM | POA: Diagnosis not present

## 2023-09-10 DIAGNOSIS — J9621 Acute and chronic respiratory failure with hypoxia: Secondary | ICD-10-CM | POA: Diagnosis not present

## 2023-09-10 DIAGNOSIS — J449 Chronic obstructive pulmonary disease, unspecified: Secondary | ICD-10-CM | POA: Diagnosis not present

## 2023-09-10 DIAGNOSIS — E1169 Type 2 diabetes mellitus with other specified complication: Secondary | ICD-10-CM | POA: Diagnosis not present

## 2023-09-10 DIAGNOSIS — E785 Hyperlipidemia, unspecified: Secondary | ICD-10-CM | POA: Diagnosis not present

## 2023-09-10 DIAGNOSIS — G893 Neoplasm related pain (acute) (chronic): Secondary | ICD-10-CM | POA: Diagnosis not present

## 2023-09-11 DIAGNOSIS — C3411 Malignant neoplasm of upper lobe, right bronchus or lung: Secondary | ICD-10-CM | POA: Diagnosis not present

## 2023-09-11 DIAGNOSIS — C787 Secondary malignant neoplasm of liver and intrahepatic bile duct: Secondary | ICD-10-CM | POA: Diagnosis not present

## 2023-09-11 DIAGNOSIS — R590 Localized enlarged lymph nodes: Secondary | ICD-10-CM | POA: Diagnosis not present

## 2023-09-11 DIAGNOSIS — D3501 Benign neoplasm of right adrenal gland: Secondary | ICD-10-CM | POA: Diagnosis not present

## 2023-09-11 DIAGNOSIS — Z96653 Presence of artificial knee joint, bilateral: Secondary | ICD-10-CM | POA: Diagnosis not present

## 2023-09-11 DIAGNOSIS — K769 Liver disease, unspecified: Secondary | ICD-10-CM | POA: Diagnosis not present

## 2023-09-14 ENCOUNTER — Ambulatory Visit: Payer: Medicare Other

## 2023-09-16 DIAGNOSIS — M5134 Other intervertebral disc degeneration, thoracic region: Secondary | ICD-10-CM | POA: Diagnosis not present

## 2023-09-16 DIAGNOSIS — G893 Neoplasm related pain (acute) (chronic): Secondary | ICD-10-CM | POA: Diagnosis not present

## 2023-09-16 DIAGNOSIS — M5030 Other cervical disc degeneration, unspecified cervical region: Secondary | ICD-10-CM | POA: Diagnosis not present

## 2023-09-16 DIAGNOSIS — M4804 Spinal stenosis, thoracic region: Secondary | ICD-10-CM | POA: Diagnosis not present

## 2023-09-16 DIAGNOSIS — C787 Secondary malignant neoplasm of liver and intrahepatic bile duct: Secondary | ICD-10-CM | POA: Diagnosis not present

## 2023-09-16 DIAGNOSIS — E1169 Type 2 diabetes mellitus with other specified complication: Secondary | ICD-10-CM | POA: Diagnosis not present

## 2023-09-16 DIAGNOSIS — G4733 Obstructive sleep apnea (adult) (pediatric): Secondary | ICD-10-CM | POA: Diagnosis not present

## 2023-09-16 DIAGNOSIS — Z9181 History of falling: Secondary | ICD-10-CM | POA: Diagnosis not present

## 2023-09-16 DIAGNOSIS — C3411 Malignant neoplasm of upper lobe, right bronchus or lung: Secondary | ICD-10-CM | POA: Diagnosis not present

## 2023-09-16 DIAGNOSIS — G928 Other toxic encephalopathy: Secondary | ICD-10-CM | POA: Diagnosis not present

## 2023-09-16 DIAGNOSIS — Z794 Long term (current) use of insulin: Secondary | ICD-10-CM | POA: Diagnosis not present

## 2023-09-16 DIAGNOSIS — Z6839 Body mass index (BMI) 39.0-39.9, adult: Secondary | ICD-10-CM | POA: Diagnosis not present

## 2023-09-16 DIAGNOSIS — I700 Atherosclerosis of aorta: Secondary | ICD-10-CM | POA: Diagnosis not present

## 2023-09-16 DIAGNOSIS — Z86711 Personal history of pulmonary embolism: Secondary | ICD-10-CM | POA: Diagnosis not present

## 2023-09-16 DIAGNOSIS — E785 Hyperlipidemia, unspecified: Secondary | ICD-10-CM | POA: Diagnosis not present

## 2023-09-16 DIAGNOSIS — M4802 Spinal stenosis, cervical region: Secondary | ICD-10-CM | POA: Diagnosis not present

## 2023-09-16 DIAGNOSIS — E669 Obesity, unspecified: Secondary | ICD-10-CM | POA: Diagnosis not present

## 2023-09-16 DIAGNOSIS — M47812 Spondylosis without myelopathy or radiculopathy, cervical region: Secondary | ICD-10-CM | POA: Diagnosis not present

## 2023-09-16 DIAGNOSIS — E860 Dehydration: Secondary | ICD-10-CM | POA: Diagnosis not present

## 2023-09-16 DIAGNOSIS — J449 Chronic obstructive pulmonary disease, unspecified: Secondary | ICD-10-CM | POA: Diagnosis not present

## 2023-09-16 DIAGNOSIS — I10 Essential (primary) hypertension: Secondary | ICD-10-CM | POA: Diagnosis not present

## 2023-09-16 DIAGNOSIS — J9621 Acute and chronic respiratory failure with hypoxia: Secondary | ICD-10-CM | POA: Diagnosis not present

## 2023-09-16 DIAGNOSIS — Z7901 Long term (current) use of anticoagulants: Secondary | ICD-10-CM | POA: Diagnosis not present

## 2023-09-16 DIAGNOSIS — Z7984 Long term (current) use of oral hypoglycemic drugs: Secondary | ICD-10-CM | POA: Diagnosis not present

## 2023-09-16 DIAGNOSIS — N400 Enlarged prostate without lower urinary tract symptoms: Secondary | ICD-10-CM | POA: Diagnosis not present

## 2023-09-17 ENCOUNTER — Ambulatory Visit: Payer: Medicare Other

## 2023-09-17 DIAGNOSIS — E1165 Type 2 diabetes mellitus with hyperglycemia: Secondary | ICD-10-CM

## 2023-09-17 DIAGNOSIS — J449 Chronic obstructive pulmonary disease, unspecified: Secondary | ICD-10-CM | POA: Diagnosis not present

## 2023-09-17 DIAGNOSIS — G893 Neoplasm related pain (acute) (chronic): Secondary | ICD-10-CM | POA: Diagnosis not present

## 2023-09-17 DIAGNOSIS — C3411 Malignant neoplasm of upper lobe, right bronchus or lung: Secondary | ICD-10-CM | POA: Diagnosis not present

## 2023-09-17 DIAGNOSIS — J9621 Acute and chronic respiratory failure with hypoxia: Secondary | ICD-10-CM | POA: Diagnosis not present

## 2023-09-17 DIAGNOSIS — E785 Hyperlipidemia, unspecified: Secondary | ICD-10-CM | POA: Diagnosis not present

## 2023-09-17 DIAGNOSIS — E1169 Type 2 diabetes mellitus with other specified complication: Secondary | ICD-10-CM | POA: Diagnosis not present

## 2023-09-18 DIAGNOSIS — C3411 Malignant neoplasm of upper lobe, right bronchus or lung: Secondary | ICD-10-CM | POA: Diagnosis not present

## 2023-09-18 DIAGNOSIS — G893 Neoplasm related pain (acute) (chronic): Secondary | ICD-10-CM | POA: Diagnosis not present

## 2023-09-18 DIAGNOSIS — J9621 Acute and chronic respiratory failure with hypoxia: Secondary | ICD-10-CM | POA: Diagnosis not present

## 2023-09-18 DIAGNOSIS — E1169 Type 2 diabetes mellitus with other specified complication: Secondary | ICD-10-CM | POA: Diagnosis not present

## 2023-09-18 DIAGNOSIS — J449 Chronic obstructive pulmonary disease, unspecified: Secondary | ICD-10-CM | POA: Diagnosis not present

## 2023-09-18 DIAGNOSIS — E785 Hyperlipidemia, unspecified: Secondary | ICD-10-CM | POA: Diagnosis not present

## 2023-09-20 MED ORDER — GLIPIZIDE 5 MG PO TABS
2.5000 mg | ORAL_TABLET | Freq: Two times a day (BID) | ORAL | 1 refills | Status: DC
Start: 2023-09-20 — End: 2024-01-28

## 2023-09-21 ENCOUNTER — Ambulatory Visit: Payer: Medicare Other

## 2023-09-22 DIAGNOSIS — Z794 Long term (current) use of insulin: Secondary | ICD-10-CM | POA: Diagnosis not present

## 2023-09-22 DIAGNOSIS — C3411 Malignant neoplasm of upper lobe, right bronchus or lung: Secondary | ICD-10-CM | POA: Diagnosis not present

## 2023-09-22 DIAGNOSIS — Z923 Personal history of irradiation: Secondary | ICD-10-CM | POA: Diagnosis not present

## 2023-09-22 DIAGNOSIS — C787 Secondary malignant neoplasm of liver and intrahepatic bile duct: Secondary | ICD-10-CM | POA: Diagnosis not present

## 2023-09-22 DIAGNOSIS — G8929 Other chronic pain: Secondary | ICD-10-CM | POA: Diagnosis not present

## 2023-09-22 DIAGNOSIS — Z7901 Long term (current) use of anticoagulants: Secondary | ICD-10-CM | POA: Diagnosis not present

## 2023-09-22 DIAGNOSIS — Z5112 Encounter for antineoplastic immunotherapy: Secondary | ICD-10-CM | POA: Diagnosis not present

## 2023-09-22 DIAGNOSIS — Z7984 Long term (current) use of oral hypoglycemic drugs: Secondary | ICD-10-CM | POA: Diagnosis not present

## 2023-09-22 DIAGNOSIS — E119 Type 2 diabetes mellitus without complications: Secondary | ICD-10-CM | POA: Diagnosis not present

## 2023-09-22 DIAGNOSIS — M549 Dorsalgia, unspecified: Secondary | ICD-10-CM | POA: Diagnosis not present

## 2023-09-22 DIAGNOSIS — Z87891 Personal history of nicotine dependence: Secondary | ICD-10-CM | POA: Diagnosis not present

## 2023-09-22 DIAGNOSIS — I10 Essential (primary) hypertension: Secondary | ICD-10-CM | POA: Diagnosis not present

## 2023-09-22 DIAGNOSIS — R1011 Right upper quadrant pain: Secondary | ICD-10-CM | POA: Diagnosis not present

## 2023-09-22 DIAGNOSIS — G4733 Obstructive sleep apnea (adult) (pediatric): Secondary | ICD-10-CM | POA: Diagnosis not present

## 2023-09-22 DIAGNOSIS — Z79899 Other long term (current) drug therapy: Secondary | ICD-10-CM | POA: Diagnosis not present

## 2023-09-22 DIAGNOSIS — Z86711 Personal history of pulmonary embolism: Secondary | ICD-10-CM | POA: Diagnosis not present

## 2023-09-24 ENCOUNTER — Ambulatory Visit: Payer: Medicare Other

## 2023-09-24 DIAGNOSIS — E1169 Type 2 diabetes mellitus with other specified complication: Secondary | ICD-10-CM | POA: Diagnosis not present

## 2023-09-24 DIAGNOSIS — G893 Neoplasm related pain (acute) (chronic): Secondary | ICD-10-CM | POA: Diagnosis not present

## 2023-09-24 DIAGNOSIS — J449 Chronic obstructive pulmonary disease, unspecified: Secondary | ICD-10-CM | POA: Diagnosis not present

## 2023-09-24 DIAGNOSIS — C3411 Malignant neoplasm of upper lobe, right bronchus or lung: Secondary | ICD-10-CM | POA: Diagnosis not present

## 2023-09-24 DIAGNOSIS — J9621 Acute and chronic respiratory failure with hypoxia: Secondary | ICD-10-CM | POA: Diagnosis not present

## 2023-09-24 DIAGNOSIS — E785 Hyperlipidemia, unspecified: Secondary | ICD-10-CM | POA: Diagnosis not present

## 2023-09-25 DIAGNOSIS — G893 Neoplasm related pain (acute) (chronic): Secondary | ICD-10-CM | POA: Diagnosis not present

## 2023-09-25 DIAGNOSIS — J449 Chronic obstructive pulmonary disease, unspecified: Secondary | ICD-10-CM | POA: Diagnosis not present

## 2023-09-25 DIAGNOSIS — C3411 Malignant neoplasm of upper lobe, right bronchus or lung: Secondary | ICD-10-CM | POA: Diagnosis not present

## 2023-09-25 DIAGNOSIS — E1169 Type 2 diabetes mellitus with other specified complication: Secondary | ICD-10-CM | POA: Diagnosis not present

## 2023-09-25 DIAGNOSIS — E785 Hyperlipidemia, unspecified: Secondary | ICD-10-CM | POA: Diagnosis not present

## 2023-09-25 DIAGNOSIS — J9621 Acute and chronic respiratory failure with hypoxia: Secondary | ICD-10-CM | POA: Diagnosis not present

## 2023-09-28 ENCOUNTER — Ambulatory Visit: Payer: Medicare Other

## 2023-09-30 DIAGNOSIS — E785 Hyperlipidemia, unspecified: Secondary | ICD-10-CM | POA: Diagnosis not present

## 2023-09-30 DIAGNOSIS — J449 Chronic obstructive pulmonary disease, unspecified: Secondary | ICD-10-CM | POA: Diagnosis not present

## 2023-09-30 DIAGNOSIS — G893 Neoplasm related pain (acute) (chronic): Secondary | ICD-10-CM | POA: Diagnosis not present

## 2023-09-30 DIAGNOSIS — J9621 Acute and chronic respiratory failure with hypoxia: Secondary | ICD-10-CM | POA: Diagnosis not present

## 2023-09-30 DIAGNOSIS — C3411 Malignant neoplasm of upper lobe, right bronchus or lung: Secondary | ICD-10-CM | POA: Diagnosis not present

## 2023-09-30 DIAGNOSIS — E1169 Type 2 diabetes mellitus with other specified complication: Secondary | ICD-10-CM | POA: Diagnosis not present

## 2023-10-01 ENCOUNTER — Ambulatory Visit: Payer: Medicare Other

## 2023-10-04 DIAGNOSIS — C3411 Malignant neoplasm of upper lobe, right bronchus or lung: Secondary | ICD-10-CM | POA: Diagnosis not present

## 2023-10-04 DIAGNOSIS — E785 Hyperlipidemia, unspecified: Secondary | ICD-10-CM | POA: Diagnosis not present

## 2023-10-04 DIAGNOSIS — J449 Chronic obstructive pulmonary disease, unspecified: Secondary | ICD-10-CM | POA: Diagnosis not present

## 2023-10-04 DIAGNOSIS — E1169 Type 2 diabetes mellitus with other specified complication: Secondary | ICD-10-CM | POA: Diagnosis not present

## 2023-10-04 DIAGNOSIS — J9621 Acute and chronic respiratory failure with hypoxia: Secondary | ICD-10-CM | POA: Diagnosis not present

## 2023-10-04 DIAGNOSIS — G893 Neoplasm related pain (acute) (chronic): Secondary | ICD-10-CM | POA: Diagnosis not present

## 2023-10-05 ENCOUNTER — Ambulatory Visit: Payer: Medicare Other

## 2023-10-06 DIAGNOSIS — M545 Low back pain, unspecified: Secondary | ICD-10-CM | POA: Diagnosis not present

## 2023-10-06 DIAGNOSIS — C3411 Malignant neoplasm of upper lobe, right bronchus or lung: Secondary | ICD-10-CM | POA: Diagnosis not present

## 2023-10-06 DIAGNOSIS — Z5111 Encounter for antineoplastic chemotherapy: Secondary | ICD-10-CM | POA: Diagnosis not present

## 2023-10-06 DIAGNOSIS — G893 Neoplasm related pain (acute) (chronic): Secondary | ICD-10-CM | POA: Diagnosis not present

## 2023-10-06 DIAGNOSIS — R1011 Right upper quadrant pain: Secondary | ICD-10-CM | POA: Diagnosis not present

## 2023-10-08 ENCOUNTER — Ambulatory Visit: Payer: Medicare Other

## 2023-10-09 ENCOUNTER — Telehealth: Payer: Self-pay | Admitting: Pulmonary Disease

## 2023-10-09 NOTE — Telephone Encounter (Signed)
Adam Wise has a question for CPAP order in [place]. Terri phone number is [phone] option 1.

## 2023-10-12 ENCOUNTER — Ambulatory Visit: Payer: Medicare Other

## 2023-10-14 DIAGNOSIS — J9621 Acute and chronic respiratory failure with hypoxia: Secondary | ICD-10-CM | POA: Diagnosis not present

## 2023-10-14 DIAGNOSIS — J449 Chronic obstructive pulmonary disease, unspecified: Secondary | ICD-10-CM | POA: Diagnosis not present

## 2023-10-14 DIAGNOSIS — G893 Neoplasm related pain (acute) (chronic): Secondary | ICD-10-CM | POA: Diagnosis not present

## 2023-10-14 DIAGNOSIS — E785 Hyperlipidemia, unspecified: Secondary | ICD-10-CM | POA: Diagnosis not present

## 2023-10-14 DIAGNOSIS — E1169 Type 2 diabetes mellitus with other specified complication: Secondary | ICD-10-CM | POA: Diagnosis not present

## 2023-10-14 DIAGNOSIS — C3411 Malignant neoplasm of upper lobe, right bronchus or lung: Secondary | ICD-10-CM | POA: Diagnosis not present

## 2023-10-15 ENCOUNTER — Telehealth: Payer: Self-pay | Admitting: Primary Care

## 2023-10-15 ENCOUNTER — Ambulatory Visit: Payer: Medicare Other

## 2023-10-15 DIAGNOSIS — J449 Chronic obstructive pulmonary disease, unspecified: Secondary | ICD-10-CM | POA: Diagnosis not present

## 2023-10-15 DIAGNOSIS — J9621 Acute and chronic respiratory failure with hypoxia: Secondary | ICD-10-CM | POA: Diagnosis not present

## 2023-10-15 DIAGNOSIS — C3411 Malignant neoplasm of upper lobe, right bronchus or lung: Secondary | ICD-10-CM | POA: Diagnosis not present

## 2023-10-15 DIAGNOSIS — E1169 Type 2 diabetes mellitus with other specified complication: Secondary | ICD-10-CM | POA: Diagnosis not present

## 2023-10-15 DIAGNOSIS — E785 Hyperlipidemia, unspecified: Secondary | ICD-10-CM | POA: Diagnosis not present

## 2023-10-15 DIAGNOSIS — G893 Neoplasm related pain (acute) (chronic): Secondary | ICD-10-CM | POA: Diagnosis not present

## 2023-10-15 NOTE — Telephone Encounter (Signed)
Home Health verbal orders Caller Name: Misty Stanley Agency Name: [facility] number: [phone]  Requesting OT  Frequency: 1x a week for 7 weeks  Please forward to [facility] pool or providers CMA

## 2023-10-15 NOTE — Telephone Encounter (Signed)
Called and advised Misty Stanley with [facility]  of the approval of the requested verbal orders for this patient. Advised to call back with any further questions.

## 2023-10-15 NOTE — Telephone Encounter (Signed)
Approved.  

## 2023-10-16 DIAGNOSIS — M5030 Other cervical disc degeneration, unspecified cervical region: Secondary | ICD-10-CM | POA: Diagnosis not present

## 2023-10-16 DIAGNOSIS — I10 Essential (primary) hypertension: Secondary | ICD-10-CM | POA: Diagnosis not present

## 2023-10-16 DIAGNOSIS — E1169 Type 2 diabetes mellitus with other specified complication: Secondary | ICD-10-CM | POA: Diagnosis not present

## 2023-10-16 DIAGNOSIS — Z794 Long term (current) use of insulin: Secondary | ICD-10-CM | POA: Diagnosis not present

## 2023-10-16 DIAGNOSIS — G4733 Obstructive sleep apnea (adult) (pediatric): Secondary | ICD-10-CM | POA: Diagnosis not present

## 2023-10-16 DIAGNOSIS — C787 Secondary malignant neoplasm of liver and intrahepatic bile duct: Secondary | ICD-10-CM | POA: Diagnosis not present

## 2023-10-16 DIAGNOSIS — J9611 Chronic respiratory failure with hypoxia: Secondary | ICD-10-CM | POA: Diagnosis not present

## 2023-10-16 DIAGNOSIS — G893 Neoplasm related pain (acute) (chronic): Secondary | ICD-10-CM | POA: Diagnosis not present

## 2023-10-16 DIAGNOSIS — M5134 Other intervertebral disc degeneration, thoracic region: Secondary | ICD-10-CM | POA: Diagnosis not present

## 2023-10-16 DIAGNOSIS — Z86711 Personal history of pulmonary embolism: Secondary | ICD-10-CM | POA: Diagnosis not present

## 2023-10-16 DIAGNOSIS — N400 Enlarged prostate without lower urinary tract symptoms: Secondary | ICD-10-CM | POA: Diagnosis not present

## 2023-10-16 DIAGNOSIS — J449 Chronic obstructive pulmonary disease, unspecified: Secondary | ICD-10-CM | POA: Diagnosis not present

## 2023-10-16 DIAGNOSIS — E669 Obesity, unspecified: Secondary | ICD-10-CM | POA: Diagnosis not present

## 2023-10-16 DIAGNOSIS — Z7984 Long term (current) use of oral hypoglycemic drugs: Secondary | ICD-10-CM | POA: Diagnosis not present

## 2023-10-16 DIAGNOSIS — Z87891 Personal history of nicotine dependence: Secondary | ICD-10-CM | POA: Diagnosis not present

## 2023-10-16 DIAGNOSIS — M4802 Spinal stenosis, cervical region: Secondary | ICD-10-CM | POA: Diagnosis not present

## 2023-10-16 DIAGNOSIS — C3411 Malignant neoplasm of upper lobe, right bronchus or lung: Secondary | ICD-10-CM | POA: Diagnosis not present

## 2023-10-16 DIAGNOSIS — M47812 Spondylosis without myelopathy or radiculopathy, cervical region: Secondary | ICD-10-CM | POA: Diagnosis not present

## 2023-10-16 DIAGNOSIS — Z7901 Long term (current) use of anticoagulants: Secondary | ICD-10-CM | POA: Diagnosis not present

## 2023-10-16 DIAGNOSIS — Z6839 Body mass index (BMI) 39.0-39.9, adult: Secondary | ICD-10-CM | POA: Diagnosis not present

## 2023-10-16 DIAGNOSIS — Z9181 History of falling: Secondary | ICD-10-CM | POA: Diagnosis not present

## 2023-10-16 DIAGNOSIS — E785 Hyperlipidemia, unspecified: Secondary | ICD-10-CM | POA: Diagnosis not present

## 2023-10-16 DIAGNOSIS — I700 Atherosclerosis of aorta: Secondary | ICD-10-CM | POA: Diagnosis not present

## 2023-10-16 DIAGNOSIS — M4804 Spinal stenosis, thoracic region: Secondary | ICD-10-CM | POA: Diagnosis not present

## 2023-10-19 ENCOUNTER — Ambulatory Visit: Payer: Medicare Other

## 2023-10-20 DIAGNOSIS — C787 Secondary malignant neoplasm of liver and intrahepatic bile duct: Secondary | ICD-10-CM | POA: Diagnosis not present

## 2023-10-20 DIAGNOSIS — C3411 Malignant neoplasm of upper lobe, right bronchus or lung: Secondary | ICD-10-CM | POA: Diagnosis not present

## 2023-10-20 DIAGNOSIS — J8410 Pulmonary fibrosis, unspecified: Secondary | ICD-10-CM | POA: Diagnosis not present

## 2023-10-20 DIAGNOSIS — R590 Localized enlarged lymph nodes: Secondary | ICD-10-CM | POA: Diagnosis not present

## 2023-10-20 DIAGNOSIS — D3501 Benign neoplasm of right adrenal gland: Secondary | ICD-10-CM | POA: Diagnosis not present

## 2023-10-22 ENCOUNTER — Ambulatory Visit: Payer: Medicare Other

## 2023-10-22 DIAGNOSIS — C787 Secondary malignant neoplasm of liver and intrahepatic bile duct: Secondary | ICD-10-CM | POA: Diagnosis not present

## 2023-10-22 DIAGNOSIS — G893 Neoplasm related pain (acute) (chronic): Secondary | ICD-10-CM | POA: Diagnosis not present

## 2023-10-22 DIAGNOSIS — J9611 Chronic respiratory failure with hypoxia: Secondary | ICD-10-CM | POA: Diagnosis not present

## 2023-10-22 DIAGNOSIS — M5134 Other intervertebral disc degeneration, thoracic region: Secondary | ICD-10-CM | POA: Diagnosis not present

## 2023-10-22 DIAGNOSIS — C3411 Malignant neoplasm of upper lobe, right bronchus or lung: Secondary | ICD-10-CM | POA: Diagnosis not present

## 2023-10-22 DIAGNOSIS — E1169 Type 2 diabetes mellitus with other specified complication: Secondary | ICD-10-CM | POA: Diagnosis not present

## 2023-10-24 ENCOUNTER — Other Ambulatory Visit: Payer: Self-pay | Admitting: Primary Care

## 2023-10-24 DIAGNOSIS — G8929 Other chronic pain: Secondary | ICD-10-CM

## 2023-10-25 ENCOUNTER — Other Ambulatory Visit: Payer: Self-pay | Admitting: Primary Care

## 2023-10-25 DIAGNOSIS — I10 Essential (primary) hypertension: Secondary | ICD-10-CM

## 2023-10-26 ENCOUNTER — Ambulatory Visit: Payer: Medicare Other

## 2023-10-26 ENCOUNTER — Telehealth: Payer: Self-pay | Admitting: Primary Care

## 2023-10-26 DIAGNOSIS — M5134 Other intervertebral disc degeneration, thoracic region: Secondary | ICD-10-CM | POA: Diagnosis not present

## 2023-10-26 DIAGNOSIS — J9611 Chronic respiratory failure with hypoxia: Secondary | ICD-10-CM | POA: Diagnosis not present

## 2023-10-26 DIAGNOSIS — G893 Neoplasm related pain (acute) (chronic): Secondary | ICD-10-CM | POA: Diagnosis not present

## 2023-10-26 DIAGNOSIS — C787 Secondary malignant neoplasm of liver and intrahepatic bile duct: Secondary | ICD-10-CM | POA: Diagnosis not present

## 2023-10-26 DIAGNOSIS — E1169 Type 2 diabetes mellitus with other specified complication: Secondary | ICD-10-CM | POA: Diagnosis not present

## 2023-10-26 DIAGNOSIS — C3411 Malignant neoplasm of upper lobe, right bronchus or lung: Secondary | ICD-10-CM | POA: Diagnosis not present

## 2023-10-26 NOTE — Telephone Encounter (Signed)
Home Health verbal orders Caller Name: Gerri Spore, [place] Agency Name: [facility] number: [phone]  Requesting PT  Reason: Strengthening gait and balance training   Frequency: 1wk 8   Please forward to [facility] pool or providers CMA

## 2023-10-26 NOTE — Telephone Encounter (Signed)
Approved.  

## 2023-10-26 NOTE — Telephone Encounter (Signed)
Called and advised [facility] Spore with  [facility]  of the approval of the requested verbal orders for this patient. Advised to call back with any further questions.

## 2023-10-27 DIAGNOSIS — C3411 Malignant neoplasm of upper lobe, right bronchus or lung: Secondary | ICD-10-CM | POA: Diagnosis not present

## 2023-10-27 DIAGNOSIS — I10 Essential (primary) hypertension: Secondary | ICD-10-CM | POA: Diagnosis not present

## 2023-10-27 DIAGNOSIS — E1169 Type 2 diabetes mellitus with other specified complication: Secondary | ICD-10-CM | POA: Diagnosis not present

## 2023-10-27 DIAGNOSIS — Z803 Family history of malignant neoplasm of breast: Secondary | ICD-10-CM | POA: Diagnosis not present

## 2023-10-27 DIAGNOSIS — C787 Secondary malignant neoplasm of liver and intrahepatic bile duct: Secondary | ICD-10-CM | POA: Diagnosis not present

## 2023-10-27 DIAGNOSIS — E230 Hypopituitarism: Secondary | ICD-10-CM | POA: Diagnosis not present

## 2023-10-27 DIAGNOSIS — F17210 Nicotine dependence, cigarettes, uncomplicated: Secondary | ICD-10-CM | POA: Diagnosis not present

## 2023-10-27 DIAGNOSIS — R1011 Right upper quadrant pain: Secondary | ICD-10-CM | POA: Diagnosis not present

## 2023-10-27 DIAGNOSIS — E119 Type 2 diabetes mellitus without complications: Secondary | ICD-10-CM | POA: Diagnosis not present

## 2023-10-27 DIAGNOSIS — G4733 Obstructive sleep apnea (adult) (pediatric): Secondary | ICD-10-CM | POA: Diagnosis not present

## 2023-10-27 DIAGNOSIS — Z5112 Encounter for antineoplastic immunotherapy: Secondary | ICD-10-CM | POA: Diagnosis not present

## 2023-10-27 DIAGNOSIS — J9611 Chronic respiratory failure with hypoxia: Secondary | ICD-10-CM | POA: Diagnosis not present

## 2023-10-27 DIAGNOSIS — M549 Dorsalgia, unspecified: Secondary | ICD-10-CM | POA: Diagnosis not present

## 2023-10-27 DIAGNOSIS — E785 Hyperlipidemia, unspecified: Secondary | ICD-10-CM | POA: Diagnosis not present

## 2023-10-29 ENCOUNTER — Ambulatory Visit: Payer: Medicare Other

## 2023-10-29 DIAGNOSIS — C787 Secondary malignant neoplasm of liver and intrahepatic bile duct: Secondary | ICD-10-CM | POA: Diagnosis not present

## 2023-10-29 DIAGNOSIS — J9611 Chronic respiratory failure with hypoxia: Secondary | ICD-10-CM | POA: Diagnosis not present

## 2023-10-29 DIAGNOSIS — E1169 Type 2 diabetes mellitus with other specified complication: Secondary | ICD-10-CM | POA: Diagnosis not present

## 2023-10-29 DIAGNOSIS — G893 Neoplasm related pain (acute) (chronic): Secondary | ICD-10-CM | POA: Diagnosis not present

## 2023-10-29 DIAGNOSIS — M5134 Other intervertebral disc degeneration, thoracic region: Secondary | ICD-10-CM | POA: Diagnosis not present

## 2023-10-29 DIAGNOSIS — C3411 Malignant neoplasm of upper lobe, right bronchus or lung: Secondary | ICD-10-CM | POA: Diagnosis not present

## 2023-11-02 ENCOUNTER — Ambulatory Visit: Payer: Medicare Other

## 2023-11-03 DIAGNOSIS — G893 Neoplasm related pain (acute) (chronic): Secondary | ICD-10-CM | POA: Diagnosis not present

## 2023-11-03 DIAGNOSIS — C3411 Malignant neoplasm of upper lobe, right bronchus or lung: Secondary | ICD-10-CM | POA: Diagnosis not present

## 2023-11-03 DIAGNOSIS — M5134 Other intervertebral disc degeneration, thoracic region: Secondary | ICD-10-CM | POA: Diagnosis not present

## 2023-11-03 DIAGNOSIS — E1169 Type 2 diabetes mellitus with other specified complication: Secondary | ICD-10-CM | POA: Diagnosis not present

## 2023-11-03 DIAGNOSIS — C787 Secondary malignant neoplasm of liver and intrahepatic bile duct: Secondary | ICD-10-CM | POA: Diagnosis not present

## 2023-11-03 DIAGNOSIS — J9611 Chronic respiratory failure with hypoxia: Secondary | ICD-10-CM | POA: Diagnosis not present

## 2023-11-05 ENCOUNTER — Ambulatory Visit: Payer: Medicare Other

## 2023-11-09 ENCOUNTER — Ambulatory Visit: Payer: Medicare Other

## 2023-11-09 DIAGNOSIS — M5416 Radiculopathy, lumbar region: Secondary | ICD-10-CM | POA: Diagnosis not present

## 2023-11-09 DIAGNOSIS — M1611 Unilateral primary osteoarthritis, right hip: Secondary | ICD-10-CM | POA: Diagnosis not present

## 2023-11-09 DIAGNOSIS — C3411 Malignant neoplasm of upper lobe, right bronchus or lung: Secondary | ICD-10-CM | POA: Diagnosis not present

## 2023-11-10 DIAGNOSIS — C3411 Malignant neoplasm of upper lobe, right bronchus or lung: Secondary | ICD-10-CM | POA: Diagnosis not present

## 2023-11-10 DIAGNOSIS — Z5112 Encounter for antineoplastic immunotherapy: Secondary | ICD-10-CM | POA: Diagnosis not present

## 2023-11-10 DIAGNOSIS — R1011 Right upper quadrant pain: Secondary | ICD-10-CM | POA: Diagnosis not present

## 2023-11-10 DIAGNOSIS — G893 Neoplasm related pain (acute) (chronic): Secondary | ICD-10-CM | POA: Diagnosis not present

## 2023-11-10 DIAGNOSIS — M545 Low back pain, unspecified: Secondary | ICD-10-CM | POA: Diagnosis not present

## 2023-11-15 DIAGNOSIS — N400 Enlarged prostate without lower urinary tract symptoms: Secondary | ICD-10-CM | POA: Diagnosis not present

## 2023-11-15 DIAGNOSIS — M5134 Other intervertebral disc degeneration, thoracic region: Secondary | ICD-10-CM | POA: Diagnosis not present

## 2023-11-15 DIAGNOSIS — G4733 Obstructive sleep apnea (adult) (pediatric): Secondary | ICD-10-CM | POA: Diagnosis not present

## 2023-11-15 DIAGNOSIS — M4804 Spinal stenosis, thoracic region: Secondary | ICD-10-CM | POA: Diagnosis not present

## 2023-11-15 DIAGNOSIS — Z86711 Personal history of pulmonary embolism: Secondary | ICD-10-CM | POA: Diagnosis not present

## 2023-11-15 DIAGNOSIS — M4802 Spinal stenosis, cervical region: Secondary | ICD-10-CM | POA: Diagnosis not present

## 2023-11-15 DIAGNOSIS — Z6839 Body mass index (BMI) 39.0-39.9, adult: Secondary | ICD-10-CM | POA: Diagnosis not present

## 2023-11-15 DIAGNOSIS — J9611 Chronic respiratory failure with hypoxia: Secondary | ICD-10-CM | POA: Diagnosis not present

## 2023-11-15 DIAGNOSIS — Z7901 Long term (current) use of anticoagulants: Secondary | ICD-10-CM | POA: Diagnosis not present

## 2023-11-15 DIAGNOSIS — Z794 Long term (current) use of insulin: Secondary | ICD-10-CM | POA: Diagnosis not present

## 2023-11-15 DIAGNOSIS — C787 Secondary malignant neoplasm of liver and intrahepatic bile duct: Secondary | ICD-10-CM | POA: Diagnosis not present

## 2023-11-15 DIAGNOSIS — Z9181 History of falling: Secondary | ICD-10-CM | POA: Diagnosis not present

## 2023-11-15 DIAGNOSIS — Z7984 Long term (current) use of oral hypoglycemic drugs: Secondary | ICD-10-CM | POA: Diagnosis not present

## 2023-11-15 DIAGNOSIS — M5030 Other cervical disc degeneration, unspecified cervical region: Secondary | ICD-10-CM | POA: Diagnosis not present

## 2023-11-15 DIAGNOSIS — I700 Atherosclerosis of aorta: Secondary | ICD-10-CM | POA: Diagnosis not present

## 2023-11-15 DIAGNOSIS — I10 Essential (primary) hypertension: Secondary | ICD-10-CM | POA: Diagnosis not present

## 2023-11-15 DIAGNOSIS — G893 Neoplasm related pain (acute) (chronic): Secondary | ICD-10-CM | POA: Diagnosis not present

## 2023-11-15 DIAGNOSIS — M47812 Spondylosis without myelopathy or radiculopathy, cervical region: Secondary | ICD-10-CM | POA: Diagnosis not present

## 2023-11-15 DIAGNOSIS — J449 Chronic obstructive pulmonary disease, unspecified: Secondary | ICD-10-CM | POA: Diagnosis not present

## 2023-11-15 DIAGNOSIS — C3411 Malignant neoplasm of upper lobe, right bronchus or lung: Secondary | ICD-10-CM | POA: Diagnosis not present

## 2023-11-15 DIAGNOSIS — Z87891 Personal history of nicotine dependence: Secondary | ICD-10-CM | POA: Diagnosis not present

## 2023-11-15 DIAGNOSIS — E785 Hyperlipidemia, unspecified: Secondary | ICD-10-CM | POA: Diagnosis not present

## 2023-11-15 DIAGNOSIS — E669 Obesity, unspecified: Secondary | ICD-10-CM | POA: Diagnosis not present

## 2023-11-15 DIAGNOSIS — E1169 Type 2 diabetes mellitus with other specified complication: Secondary | ICD-10-CM | POA: Diagnosis not present

## 2023-11-16 ENCOUNTER — Ambulatory Visit: Payer: Medicare Other

## 2023-11-16 DIAGNOSIS — C3411 Malignant neoplasm of upper lobe, right bronchus or lung: Secondary | ICD-10-CM | POA: Diagnosis not present

## 2023-11-16 DIAGNOSIS — J9611 Chronic respiratory failure with hypoxia: Secondary | ICD-10-CM | POA: Diagnosis not present

## 2023-11-16 DIAGNOSIS — M5134 Other intervertebral disc degeneration, thoracic region: Secondary | ICD-10-CM | POA: Diagnosis not present

## 2023-11-16 DIAGNOSIS — C787 Secondary malignant neoplasm of liver and intrahepatic bile duct: Secondary | ICD-10-CM | POA: Diagnosis not present

## 2023-11-16 DIAGNOSIS — E1169 Type 2 diabetes mellitus with other specified complication: Secondary | ICD-10-CM | POA: Diagnosis not present

## 2023-11-16 DIAGNOSIS — G893 Neoplasm related pain (acute) (chronic): Secondary | ICD-10-CM | POA: Diagnosis not present

## 2023-11-19 ENCOUNTER — Ambulatory Visit: Payer: Medicare Other

## 2023-11-23 ENCOUNTER — Ambulatory Visit: Payer: Medicare Other

## 2023-11-24 DIAGNOSIS — Z7901 Long term (current) use of anticoagulants: Secondary | ICD-10-CM | POA: Diagnosis not present

## 2023-11-24 DIAGNOSIS — Z7984 Long term (current) use of oral hypoglycemic drugs: Secondary | ICD-10-CM | POA: Diagnosis not present

## 2023-11-24 DIAGNOSIS — Z79899 Other long term (current) drug therapy: Secondary | ICD-10-CM | POA: Diagnosis not present

## 2023-11-24 DIAGNOSIS — I10 Essential (primary) hypertension: Secondary | ICD-10-CM | POA: Diagnosis not present

## 2023-11-24 DIAGNOSIS — Z87891 Personal history of nicotine dependence: Secondary | ICD-10-CM | POA: Diagnosis not present

## 2023-11-24 DIAGNOSIS — C3411 Malignant neoplasm of upper lobe, right bronchus or lung: Secondary | ICD-10-CM | POA: Diagnosis not present

## 2023-11-24 DIAGNOSIS — Z803 Family history of malignant neoplasm of breast: Secondary | ICD-10-CM | POA: Diagnosis not present

## 2023-11-24 DIAGNOSIS — E119 Type 2 diabetes mellitus without complications: Secondary | ICD-10-CM | POA: Diagnosis not present

## 2023-11-24 DIAGNOSIS — Z794 Long term (current) use of insulin: Secondary | ICD-10-CM | POA: Diagnosis not present

## 2023-11-26 ENCOUNTER — Ambulatory Visit: Payer: Medicare Other

## 2023-11-26 DIAGNOSIS — M5134 Other intervertebral disc degeneration, thoracic region: Secondary | ICD-10-CM | POA: Diagnosis not present

## 2023-11-26 DIAGNOSIS — G893 Neoplasm related pain (acute) (chronic): Secondary | ICD-10-CM | POA: Diagnosis not present

## 2023-11-26 DIAGNOSIS — J9611 Chronic respiratory failure with hypoxia: Secondary | ICD-10-CM | POA: Diagnosis not present

## 2023-11-26 DIAGNOSIS — E1169 Type 2 diabetes mellitus with other specified complication: Secondary | ICD-10-CM | POA: Diagnosis not present

## 2023-11-26 DIAGNOSIS — C787 Secondary malignant neoplasm of liver and intrahepatic bile duct: Secondary | ICD-10-CM | POA: Diagnosis not present

## 2023-11-26 DIAGNOSIS — C3411 Malignant neoplasm of upper lobe, right bronchus or lung: Secondary | ICD-10-CM | POA: Diagnosis not present

## 2023-11-27 DIAGNOSIS — E1169 Type 2 diabetes mellitus with other specified complication: Secondary | ICD-10-CM | POA: Diagnosis not present

## 2023-11-27 DIAGNOSIS — C3411 Malignant neoplasm of upper lobe, right bronchus or lung: Secondary | ICD-10-CM | POA: Diagnosis not present

## 2023-11-27 DIAGNOSIS — G893 Neoplasm related pain (acute) (chronic): Secondary | ICD-10-CM | POA: Diagnosis not present

## 2023-11-27 DIAGNOSIS — M5134 Other intervertebral disc degeneration, thoracic region: Secondary | ICD-10-CM | POA: Diagnosis not present

## 2023-11-27 DIAGNOSIS — C787 Secondary malignant neoplasm of liver and intrahepatic bile duct: Secondary | ICD-10-CM | POA: Diagnosis not present

## 2023-11-27 DIAGNOSIS — J9611 Chronic respiratory failure with hypoxia: Secondary | ICD-10-CM | POA: Diagnosis not present

## 2023-11-30 ENCOUNTER — Ambulatory Visit: Payer: Medicare Other

## 2023-11-30 DIAGNOSIS — C3411 Malignant neoplasm of upper lobe, right bronchus or lung: Secondary | ICD-10-CM | POA: Diagnosis not present

## 2023-11-30 DIAGNOSIS — C787 Secondary malignant neoplasm of liver and intrahepatic bile duct: Secondary | ICD-10-CM | POA: Diagnosis not present

## 2023-11-30 DIAGNOSIS — G893 Neoplasm related pain (acute) (chronic): Secondary | ICD-10-CM | POA: Diagnosis not present

## 2023-11-30 DIAGNOSIS — M5134 Other intervertebral disc degeneration, thoracic region: Secondary | ICD-10-CM | POA: Diagnosis not present

## 2023-11-30 DIAGNOSIS — J9611 Chronic respiratory failure with hypoxia: Secondary | ICD-10-CM | POA: Diagnosis not present

## 2023-11-30 DIAGNOSIS — E1169 Type 2 diabetes mellitus with other specified complication: Secondary | ICD-10-CM | POA: Diagnosis not present

## 2023-12-03 ENCOUNTER — Ambulatory Visit: Payer: Medicare Other

## 2023-12-04 DIAGNOSIS — E1169 Type 2 diabetes mellitus with other specified complication: Secondary | ICD-10-CM | POA: Diagnosis not present

## 2023-12-04 DIAGNOSIS — M5134 Other intervertebral disc degeneration, thoracic region: Secondary | ICD-10-CM | POA: Diagnosis not present

## 2023-12-04 DIAGNOSIS — J9611 Chronic respiratory failure with hypoxia: Secondary | ICD-10-CM | POA: Diagnosis not present

## 2023-12-04 DIAGNOSIS — G893 Neoplasm related pain (acute) (chronic): Secondary | ICD-10-CM | POA: Diagnosis not present

## 2023-12-04 DIAGNOSIS — C3411 Malignant neoplasm of upper lobe, right bronchus or lung: Secondary | ICD-10-CM | POA: Diagnosis not present

## 2023-12-04 DIAGNOSIS — C787 Secondary malignant neoplasm of liver and intrahepatic bile duct: Secondary | ICD-10-CM | POA: Diagnosis not present

## 2023-12-05 ENCOUNTER — Other Ambulatory Visit: Payer: Self-pay | Admitting: Physician Assistant

## 2023-12-05 DIAGNOSIS — I2692 Saddle embolus of pulmonary artery without acute cor pulmonale: Secondary | ICD-10-CM

## 2023-12-06 ENCOUNTER — Encounter: Payer: Self-pay | Admitting: Internal Medicine

## 2023-12-08 DIAGNOSIS — Z5112 Encounter for antineoplastic immunotherapy: Secondary | ICD-10-CM | POA: Diagnosis not present

## 2023-12-08 DIAGNOSIS — C3411 Malignant neoplasm of upper lobe, right bronchus or lung: Secondary | ICD-10-CM | POA: Diagnosis not present

## 2023-12-11 DIAGNOSIS — J9611 Chronic respiratory failure with hypoxia: Secondary | ICD-10-CM | POA: Diagnosis not present

## 2023-12-11 DIAGNOSIS — C3411 Malignant neoplasm of upper lobe, right bronchus or lung: Secondary | ICD-10-CM | POA: Diagnosis not present

## 2023-12-11 DIAGNOSIS — C787 Secondary malignant neoplasm of liver and intrahepatic bile duct: Secondary | ICD-10-CM | POA: Diagnosis not present

## 2023-12-11 DIAGNOSIS — G893 Neoplasm related pain (acute) (chronic): Secondary | ICD-10-CM | POA: Diagnosis not present

## 2023-12-11 DIAGNOSIS — M5134 Other intervertebral disc degeneration, thoracic region: Secondary | ICD-10-CM | POA: Diagnosis not present

## 2023-12-11 DIAGNOSIS — E1169 Type 2 diabetes mellitus with other specified complication: Secondary | ICD-10-CM | POA: Diagnosis not present

## 2023-12-13 DIAGNOSIS — M4726 Other spondylosis with radiculopathy, lumbar region: Secondary | ICD-10-CM | POA: Diagnosis not present

## 2023-12-13 DIAGNOSIS — J948 Other specified pleural conditions: Secondary | ICD-10-CM | POA: Diagnosis not present

## 2023-12-13 DIAGNOSIS — M48061 Spinal stenosis, lumbar region without neurogenic claudication: Secondary | ICD-10-CM | POA: Diagnosis not present

## 2023-12-13 DIAGNOSIS — G8929 Other chronic pain: Secondary | ICD-10-CM | POA: Diagnosis not present

## 2023-12-13 DIAGNOSIS — M4727 Other spondylosis with radiculopathy, lumbosacral region: Secondary | ICD-10-CM | POA: Diagnosis not present

## 2023-12-13 DIAGNOSIS — M47814 Spondylosis without myelopathy or radiculopathy, thoracic region: Secondary | ICD-10-CM | POA: Diagnosis not present

## 2023-12-13 DIAGNOSIS — M5115 Intervertebral disc disorders with radiculopathy, thoracolumbar region: Secondary | ICD-10-CM | POA: Diagnosis not present

## 2023-12-13 DIAGNOSIS — M4807 Spinal stenosis, lumbosacral region: Secondary | ICD-10-CM | POA: Diagnosis not present

## 2023-12-13 DIAGNOSIS — K7689 Other specified diseases of liver: Secondary | ICD-10-CM | POA: Diagnosis not present

## 2023-12-13 DIAGNOSIS — E278 Other specified disorders of adrenal gland: Secondary | ICD-10-CM | POA: Diagnosis not present

## 2023-12-13 DIAGNOSIS — R590 Localized enlarged lymph nodes: Secondary | ICD-10-CM | POA: Diagnosis not present

## 2023-12-13 DIAGNOSIS — C3411 Malignant neoplasm of upper lobe, right bronchus or lung: Secondary | ICD-10-CM | POA: Diagnosis not present

## 2023-12-21 DIAGNOSIS — M1611 Unilateral primary osteoarthritis, right hip: Secondary | ICD-10-CM | POA: Diagnosis not present

## 2023-12-21 DIAGNOSIS — M160 Bilateral primary osteoarthritis of hip: Secondary | ICD-10-CM | POA: Diagnosis not present

## 2023-12-22 ENCOUNTER — Other Ambulatory Visit: Payer: Self-pay | Admitting: Internal Medicine

## 2023-12-22 DIAGNOSIS — Z7901 Long term (current) use of anticoagulants: Secondary | ICD-10-CM | POA: Diagnosis not present

## 2023-12-22 DIAGNOSIS — Z7984 Long term (current) use of oral hypoglycemic drugs: Secondary | ICD-10-CM | POA: Diagnosis not present

## 2023-12-22 DIAGNOSIS — C787 Secondary malignant neoplasm of liver and intrahepatic bile duct: Secondary | ICD-10-CM | POA: Diagnosis not present

## 2023-12-22 DIAGNOSIS — I10 Essential (primary) hypertension: Secondary | ICD-10-CM | POA: Diagnosis not present

## 2023-12-22 DIAGNOSIS — E119 Type 2 diabetes mellitus without complications: Secondary | ICD-10-CM | POA: Diagnosis not present

## 2023-12-22 DIAGNOSIS — Z803 Family history of malignant neoplasm of breast: Secondary | ICD-10-CM | POA: Diagnosis not present

## 2023-12-22 DIAGNOSIS — R7989 Other specified abnormal findings of blood chemistry: Secondary | ICD-10-CM | POA: Diagnosis not present

## 2023-12-22 DIAGNOSIS — Z79899 Other long term (current) drug therapy: Secondary | ICD-10-CM | POA: Diagnosis not present

## 2023-12-22 DIAGNOSIS — C3411 Malignant neoplasm of upper lobe, right bronchus or lung: Secondary | ICD-10-CM | POA: Diagnosis not present

## 2023-12-22 DIAGNOSIS — Z87891 Personal history of nicotine dependence: Secondary | ICD-10-CM | POA: Diagnosis not present

## 2023-12-22 DIAGNOSIS — G4733 Obstructive sleep apnea (adult) (pediatric): Secondary | ICD-10-CM | POA: Diagnosis not present

## 2023-12-22 DIAGNOSIS — N179 Acute kidney failure, unspecified: Secondary | ICD-10-CM | POA: Diagnosis not present

## 2023-12-23 ENCOUNTER — Telehealth: Payer: Self-pay | Admitting: Internal Medicine

## 2023-12-23 NOTE — Telephone Encounter (Signed)
 Scheduled appointments per scheduling message. Patient is aware of the made appointments and specifically requested to be seen by Dr.Mohamed.

## 2023-12-24 ENCOUNTER — Inpatient Hospital Stay (HOSPITAL_BASED_OUTPATIENT_CLINIC_OR_DEPARTMENT_OTHER): Payer: Medicare Other | Admitting: Internal Medicine

## 2023-12-24 ENCOUNTER — Inpatient Hospital Stay: Payer: Medicare Other

## 2023-12-24 ENCOUNTER — Telehealth: Payer: Self-pay | Admitting: Internal Medicine

## 2023-12-24 ENCOUNTER — Telehealth: Payer: Self-pay

## 2023-12-24 VITALS — BP 103/91 | HR 98 | Temp 97.6°F | Resp 20 | Wt 263.1 lb

## 2023-12-24 DIAGNOSIS — M549 Dorsalgia, unspecified: Secondary | ICD-10-CM | POA: Insufficient documentation

## 2023-12-24 DIAGNOSIS — Z79634 Long term (current) use of topoisomerase inhibitor: Secondary | ICD-10-CM | POA: Insufficient documentation

## 2023-12-24 DIAGNOSIS — Z510 Encounter for antineoplastic radiation therapy: Secondary | ICD-10-CM | POA: Diagnosis not present

## 2023-12-24 DIAGNOSIS — D61810 Antineoplastic chemotherapy induced pancytopenia: Secondary | ICD-10-CM | POA: Insufficient documentation

## 2023-12-24 DIAGNOSIS — Z5111 Encounter for antineoplastic chemotherapy: Secondary | ICD-10-CM | POA: Insufficient documentation

## 2023-12-24 DIAGNOSIS — K769 Liver disease, unspecified: Secondary | ICD-10-CM | POA: Insufficient documentation

## 2023-12-24 DIAGNOSIS — G8929 Other chronic pain: Secondary | ICD-10-CM | POA: Insufficient documentation

## 2023-12-24 DIAGNOSIS — C3411 Malignant neoplasm of upper lobe, right bronchus or lung: Secondary | ICD-10-CM | POA: Insufficient documentation

## 2023-12-24 DIAGNOSIS — C7951 Secondary malignant neoplasm of bone: Secondary | ICD-10-CM | POA: Insufficient documentation

## 2023-12-24 DIAGNOSIS — D6481 Anemia due to antineoplastic chemotherapy: Secondary | ICD-10-CM

## 2023-12-24 DIAGNOSIS — R000 Tachycardia, unspecified: Secondary | ICD-10-CM | POA: Insufficient documentation

## 2023-12-24 LAB — CBC WITH DIFFERENTIAL (CANCER CENTER ONLY)
Abs Immature Granulocytes: 0.02 10*3/uL (ref 0.00–0.07)
Basophils Absolute: 0 10*3/uL (ref 0.0–0.1)
Basophils Relative: 1 %
Eosinophils Absolute: 0.1 10*3/uL (ref 0.0–0.5)
Eosinophils Relative: 1 %
HCT: 36.8 % — ABNORMAL LOW (ref 39.0–52.0)
Hemoglobin: 11.5 g/dL — ABNORMAL LOW (ref 13.0–17.0)
Immature Granulocytes: 0 %
Lymphocytes Relative: 8 %
Lymphs Abs: 0.5 10*3/uL — ABNORMAL LOW (ref 0.7–4.0)
MCH: 29.3 pg (ref 26.0–34.0)
MCHC: 31.3 g/dL (ref 30.0–36.0)
MCV: 93.6 fL (ref 80.0–100.0)
Monocytes Absolute: 0.6 10*3/uL (ref 0.1–1.0)
Monocytes Relative: 10 %
Neutro Abs: 5 10*3/uL (ref 1.7–7.7)
Neutrophils Relative %: 80 %
Platelet Count: 225 10*3/uL (ref 150–400)
RBC: 3.93 MIL/uL — ABNORMAL LOW (ref 4.22–5.81)
RDW: 15.2 % (ref 11.5–15.5)
WBC Count: 6.2 10*3/uL (ref 4.0–10.5)
nRBC: 0 % (ref 0.0–0.2)

## 2023-12-24 LAB — CMP (CANCER CENTER ONLY)
ALT: 13 U/L (ref 0–44)
AST: 22 U/L (ref 15–41)
Albumin: 3.8 g/dL (ref 3.5–5.0)
Alkaline Phosphatase: 76 U/L (ref 38–126)
Anion gap: 6 (ref 5–15)
BUN: 16 mg/dL (ref 8–23)
CO2: 32 mmol/L (ref 22–32)
Calcium: 9 mg/dL (ref 8.9–10.3)
Chloride: 102 mmol/L (ref 98–111)
Creatinine: 1.33 mg/dL — ABNORMAL HIGH (ref 0.61–1.24)
GFR, Estimated: 56 mL/min — ABNORMAL LOW (ref >60)
Glucose, Bld: 101 mg/dL — ABNORMAL HIGH (ref 70–99)
Potassium: 4.8 mmol/L (ref 3.5–5.1)
Sodium: 140 mmol/L (ref 135–145)
Total Bilirubin: 0.4 mg/dL (ref 0.0–1.2)
Total Protein: 6.6 g/dL (ref 6.5–8.1)

## 2023-12-24 LAB — SAMPLE TO BLOOD BANK

## 2023-12-24 NOTE — Telephone Encounter (Signed)
 Spoke with patient in regards to lab results. Per Cassie, PA-  encourage patient to increase fluids.  Patient verbalized understanding.

## 2023-12-24 NOTE — Progress Notes (Signed)
 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

## 2023-12-24 NOTE — Progress Notes (Signed)
 [facility] [phone]   [phone]  OFFICE PROGRESS NOTE  Adam Comer POUR, NP [address] [place] [address]  DIAGNOSIS: Relapsed extensive stage (T4, N3, M1c)  small cell lung cancer diagnosed in April 2021 and presented with large right upper lobe/right hilar mass with ipsilateral and contralateral mediastinal and right supraclavicular lymphadenopathy in addition to multiple liver lesions. He has disease progression after the first line of chemotherapy in December 2021.  PRIOR THERAPY: 1) Palliative radiotherapy to the right upper lobe lung mass under the care of Dr. Dewey. 2) Systemic chemotherapy with carboplatin  for AUC of 5 on day 1, etoposide  100 mg/M2 on days 1, 2 and 3 in addition to Imfinzi  1500 mg IV every 3 weeks with chemotherapy then every 4 weeks for maintenance if the patient has no evidence for progression.  He will also receive Cosela  240 mg/m2 on the days of the chemotherapy.  Status post 9 cycles.  Starting from cycle #5 the patient will be on maintenance treatment with immunotherapy with Imfinzi  1500 mg IV every 4 weeks. Last dose of chemotherapy was given on November 13, 2020. This treatment was discontinued secondary to disease progression 3) Systemic chemotherapy with carboplatin  for AUC of 5 on day 1, etoposide  100 mg/M2 on days 1, 2 and 3, Tecentriq  1200 mg IV every 3 weeks as well as Cosela  250 mg/M2 on the days of the chemotherapy every 3 weeks.  First dose December 18, 2020.  Status post 8 cycles. 4) Zepzelca  (lurbinectedin ) 3.2 mgm2 IV every 3 weeks. Last dose on 01/23/22. Status post 12 cycles.  5) Palliative systemic chemotherapy with irinotecan  65 mg/m2 on days 1 and 8 IV every 3 weeks.  Status post 3 cycles.  Last dose was given April 01, 2022 discontinued secondary to disease progression. 6) SBRT to the progressive liver lesions under the care of Dr. Dewey.  Last fraction January 27, 2023. 7) systemic chemotherapy  with carboplatin  for AUC of 5 on day 1, etoposide  100 Mg/M2 on days 1, 2 and 3 with Cosela  before the chemotherapy.  First dose expected to start on 05/28/2022.  Status post 15 cycles.  Starting from cycle #5 his carboplatin  will be reduced to AUC of 4 and 2 etoposide  80 Mg/M2.  Last dose was giving April 07, 2023 discontinued for suspicious disease progression. 8) tarlatamab (6/17-12/29)    CURRENT THERAPY: Topotecan  1.2 mg/M2 on days 1-5 every 3 weeks with Cosela  240 Mg/M2 IV before the chemotherapy.  First dose January 04, 2024.  INTERVAL HISTORY: Adam Wise 73 y.o. male returns to the clinic today for follow-up visit accompanied by his wife.Discussed the use of AI scribe software for clinical note transcription with the patient, who gave verbal consent to proceed.  History of Present Illness   The patient, Adam Wise, a 73 year old diagnosed with extensive stage small cell lung cancer in April 2021, has been on multiple treatments since diagnosis. Initial treatment included carboplatin , etoposide , Imfinzi , and Cosela  for myeloprotection. The cancer responded well initially but later showed progression. Subsequent treatments included lurbinectedin  (Zepzelca ) and irinotecan , both of which were followed by disease progression. The patient also received radiation therapy for progressive disease. A repeat treatment with carboplatin  and etoposide  was administered before referral to [facility] for treatment with Tarlatamab. This regimen was followed for approximately seven months, from June to December.  A recent CT scan revealed disease progression in the liver and bone, although the lungs still appear  reasonably good. The patient reports feeling pretty good but has been experiencing pain in the back and hip. An MRI, CT scan, and X-ray of the lumbar spine revealed a bone lesion in the right iliac bone, which has been causing the patient discomfort.        MEDICAL HISTORY: Past Medical History:   Diagnosis Date   Acute on chronic respiratory failure with hypoxia (HCC) 10/12/2021   Chickenpox    Chronic knee pain    Chronic low back pain    COPD exacerbation (HCC) 10/11/2021   COPD with exacerbation (HCC) 10/12/2021   Coronary artery calcification seen on CAT scan 11/2021   Coronary CTA 11/27/2021: Moderate to large right pleural effusion and compressive atelectasis right lung base. => Coronary Calcium Score 657.  Diffuse RCA calcification.  Minimal mild disease in the LAD and diagonal branches. == Overall limited study.  Notable artifact.   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 He has disease progression after the first line of chemotherapy in December 2021.   Testosterone  deficiency    Type 2 diabetes mellitus (HCC)     ALLERGIES:  is allergic to bupropion and hydrochlorothiazide.  MEDICATIONS:  Current Outpatient Medications  Medication Sig Dispense Refill   amLODipine  (NORVASC ) 10 MG tablet TAKE 1 TABLET BY MOUTH EVERY DAY FOR BLOOD PRESSURE 90 tablet 2   B-D 3CC LUER-LOK SYR 22GX1 22G X 1 3 ML MISC USE AS INSTRUCTED FOR TESTOSTERONE  INJECTION EVERY 2 WEEKS 10 each 2   Black Pepper-Turmeric (TURMERIC COMPLEX/BLACK PEPPER PO) Take 1 tablet by mouth in the morning and at bedtime.     blood glucose meter kit and supplies KIT Dispense based on patient and insurance preference. Use up to four times daily as directed. 1 each 0   Coenzyme Q10 (COQ10) 200 MG CAPS Take 200 mg by mouth at bedtime.     cyclobenzaprine  (FLEXERIL ) 5 MG tablet TAKE 1 TABLET BY MOUTH AT BEDTIME AS  NEEDED FOR MUSCLE SPASMS. 90 tablet 0   docusate sodium  (COLACE) 100 MG capsule Take 1 capsule (100 mg total) by mouth every 12 (twelve) hours. (Patient not taking: Reported on 08/10/2023) 60 capsule 0   ELIQUIS  5 MG TABS tablet TAKE 1 TABLET BY MOUTH TWICE A DAY 180 tablet 2   esomeprazole (NEXIUM) 20 MG capsule Take 20 mg by mouth in the morning.     gabapentin  (NEURONTIN ) 300 MG capsule TAKE 1 CAPSULE (300 MG TOTAL) BY MOUTH 2 (TWO) TIMES DAILY. FOR BACK PAIN. 180 capsule 1   glipiZIDE  (GLUCOTROL ) 5 MG tablet Take 0.5 tablets (2.5 mg total) by mouth 2 (two) times daily before a meal. for diabetes. 90 tablet 1   glucose blood (ONETOUCH ULTRA) test strip USE UP TO 4 TIMES DAILY AS DIRECTED 400 strip 5   Insulin  Glargine (BASAGLAR  KWIKPEN) 100 UNIT/ML INJECT 24 UNITS INTO THE SKIN AT BEDTIME. FOR DIABETES. (Patient taking differently: Inject 0-24 Units into the skin See admin instructions. Inject 24 units into the skin at bedtime and PER SLIDING SCALE in the morning AS NEEDED FOR AN ELEVATED BGL, possibly caused by  other prescribed medications) 30 mL 1   Insulin  Pen Needle (BD PEN NEEDLE NANO U/F) 32G X 4 MM MISC Use with insulin  as prescribed Dx Code: E11.9 100 each 3   ipratropium-albuterol  (DUONEB) 0.5-2.5 (3) MG/3ML SOLN Take 3 mLs by nebulization every 4 (four) hours while awake for 3 days, THEN 3 mLs every 4 (four) hours as needed (shortness of breath or wheezing). 360 mL 0   KRILL OIL PO Take 1 capsule by mouth daily.     Lancets MISC USE UP TO 3 TIMES DAILY AS DIRECTED 100 each 2   lidocaine  (LIDODERM ) 5 % Place 1 patch onto the skin daily. Remove & Discard patch within 12 hours or as directed by MD 30 patch 0   losartan  (COZAAR ) 50 MG tablet TAKE 1 TABLET (50 MG TOTAL) BY MOUTH DAILY. FOR BLOOD PRESSURE. 90 tablet 3   Melatonin 10 MG CAPS Take 10 mg by mouth at bedtime as needed (for sleep).     metFORMIN  (GLUCOPHAGE ) 1000 MG tablet TAKE 1 TABLET (1,000 MG TOTAL) BY MOUTH 2 (TWO) TIMES DAILY  WITH A MEAL. FOR DIABETES 180 tablet 0   mirtazapine  (REMERON  SOL-TAB) 15 MG disintegrating tablet Take 0.5 tablets (7.5 mg total) by mouth at bedtime. 15 tablet 1   Multiple Vitamin (MULTI-VITAMINS) TABS Take 1 tablet by mouth daily with breakfast.     ondansetron  (ZOFRAN ) 4 MG tablet Take 1 tablet (4 mg total) by mouth every 8 (eight) hours as needed for nausea or vomiting. 30 tablet 0   pioglitazone  (ACTOS ) 45 MG tablet TAKE 1 TABLET (45 MG TOTAL) BY MOUTH DAILY. FOR DIABETES. 90 tablet 1   pravastatin  (PRAVACHOL ) 40 MG tablet TAKE 1 TABLET BY MOUTH EVERY DAY IN THE EVENING FOR CHOLESTEROL 90 tablet 3   PRESCRIPTION MEDICATION CPAP- At bedtime     testosterone  cypionate (DEPOTESTOSTERONE CYPIONATE) 200 MG/ML injection Inject 200 mg into the muscle every 14 (fourteen) days.     No current facility-administered medications for this visit.    SURGICAL HISTORY:  Past Surgical History:  Procedure Laterality Date   CHEST TUBE INSERTION Right 01/20/2022   Procedure: CHEST TUBE INSERTION;  Surgeon: Claudene Toribio BROCKS, MD;  Location: [facility] ENDOSCOPY;  Service: Pulmonary;  Laterality: Right;  w/ Talc  Pleurodesis, planned admit for Obs afterwards   CHEST TUBE INSERTION Right 04/28/2022   Procedure: INSERTION PLEURAL DRAINAGE CATHETER - Pigtail tail, drainage;  Surgeon: Claudene Toribio BROCKS, MD;  Location: [facility] ENDOSCOPY;  Service: Pulmonary;  Laterality: Right;  talc  pleurodesis   CHEST TUBE INSERTION Right 09/01/2022   Procedure: INSERTION PLEURAL DRAINAGE CATHETER;  Surgeon: Alaine Vicenta NOVAK, MD;  Location: [facility] ENDOSCOPY;  Service: Cardiopulmonary;  Laterality: Right;  pigtail catheter placement with talc  pleurodesis   COLONOSCOPY WITH PROPOFOL  N/A 12/17/2018   Procedure: COLONOSCOPY WITH PROPOFOL ;  Surgeon: Janalyn Keene NOVAK, MD;  Location: [facility] ENDOSCOPY;  Service: Gastroenterology;  Laterality: N/A;   IR IMAGING GUIDED PORT INSERTION  04/17/2020   IR THORACENTESIS ASP PLEURAL SPACE W/IMG GUIDE  11/29/2021    IR THORACENTESIS ASP PLEURAL SPACE W/IMG GUIDE  01/03/2022   JOINT REPLACEMENT Bilateral    REPLACEMENT TOTAL KNEE BILATERAL  2015   TALC  PLEURODESIS  09/01/2022   Procedure: TALC  PLEURADESIS;  Surgeon: Alaine Vicenta NOVAK, MD;  Location: [facility] ENDOSCOPY;  Service: Cardiopulmonary;;   TONSILLECTOMY  1960   TRANSTHORACIC ECHOCARDIOGRAM  05/2020   a) 05/2020: EF 55 to 60%.  No R WMA.  Mild LVH.  Indeterminate LVEDP.  Unable to  assess RVP.  Normal aortic and mitral valves.  Mildly elevated RAP.; b) 09/2021: EF 50-55%. No RWMA. Mild LVH. ~ LVEDP. Mild LA Dilation. NORMAL RV/RAP.  Normal MV/AoV.    REVIEW OF SYSTEMS:  Constitutional: positive for fatigue Eyes: negative Ears, nose, mouth, throat, and face: negative Respiratory: positive for dyspnea on exertion Cardiovascular: negative Gastrointestinal: negative Genitourinary:negative Integument/breast: negative Hematologic/lymphatic: negative Musculoskeletal:positive for back pain Neurological: negative Behavioral/Psych: negative Endocrine: negative Allergic/Immunologic: negative   PHYSICAL EXAMINATION: General appearance: alert, cooperative, fatigued, and no distress Head: Normocephalic, without obvious abnormality, atraumatic Neck: no adenopathy, no JVD, supple, symmetrical, trachea midline, and thyroid  not enlarged, symmetric, no tenderness/mass/nodules Lymph nodes: Cervical, supraclavicular, and axillary nodes normal. Resp: clear to auscultation bilaterally Back: symmetric, no curvature. ROM normal. No CVA tenderness. Cardio: regular rate and rhythm, S1, S2 normal, no murmur, click, rub or gallop GI: soft, non-tender; bowel sounds normal; no masses,  no organomegaly Extremities: extremities normal, atraumatic, no cyanosis or edema Neurologic: Alert and oriented X 3, normal strength and tone. Normal symmetric reflexes. Normal coordination and gait  ECOG PERFORMANCE STATUS: 1 - Symptomatic but completely ambulatory  Blood pressure (!)  103/91, pulse 98, temperature 97.6 F (36.4 C), temperature source Oral, resp. rate 20, weight 263 lb 0.8 oz (119.3 kg), SpO2 95%.   LABORATORY DATA: Lab Results  Component Value Date   WBC 6.2 12/24/2023   HGB 11.5 (L) 12/24/2023   HCT 36.8 (L) 12/24/2023   MCV 93.6 12/24/2023   PLT 225 12/24/2023      Chemistry      Component Value Date/Time   NA 136 08/14/2023 0347   K 3.8 08/14/2023 0347   CL 98 08/14/2023 0347   CO2 28 08/14/2023 0347   BUN 9 08/14/2023 0347   CREATININE 0.89 08/14/2023 0347   CREATININE 1.09 05/05/2023 0959   CREATININE 1.45 (H) 10/18/2021 1533      Component Value Date/Time   CALCIUM 8.3 (L) 08/14/2023 0347   ALKPHOS 63 08/10/2023 1708   AST 64 (H) 08/10/2023 1708   AST 21 05/05/2023 0959   ALT 25 08/10/2023 1708   ALT 17 05/05/2023 0959   BILITOT 0.6 08/10/2023 1708   BILITOT 0.5 05/05/2023 0959       RADIOGRAPHIC STUDIES:    ASSESSMENT AND PLAN: This is a very pleasant 73 year old Caucasian male diagnosed with extensive stage (T4, N3, M1C) small cell lung cancer presented with large right upper lobe lung mass in addition to mediastinal and right supraclavicular lymphadenopathy and multiple metastatic liver lesions diagnosed in April 2021. The patient initially underwent systemic chemotherapy with carboplatin  for an AUC of 5 on day 1, etoposide  100 mg per metered squared on days 1, 2, and 3 in addition to Cosela  for myeloprotection.  He also received immunotherapy with Imfinzi  on day 1 of every chemotherapy cycle.  He is status post 9 cycles.  Starting from cycle #5 he was on single agent immunotherapy with Imfinzi  IV every 4 weeks.  The patient had evidence of disease progression on his scan from December 2021.  He was then started on systemic chemotherapy with carboplatin  for an AUC of 5 on day 1, etoposide  100 mg per metered squared on days 1, 2, and 3 in addition to Tecentriq  1200 mg on day 1, the patient is status post 9 cycles.  Starting from  cycle #5, the patient started maintenance immunotherapy with Tecentriq .  This was discontinued due to evidence of disease progression. The patient started third line treatment  with Zepzelca  (lurbinectedin ) 3.2 Mg/M2 every 3-week status post 12 cycles of this treatment and he has been tolerating it fairly well except for fatigue.  This treatment was discontinued secondary to disease progression. He underwent fourth line treatment with systemic chemotherapy with irinotecan  65 Mg/M2 on days 1 and 8 every 3 weeks.  Status post 3 cycles.  He tolerated this treatment well with no concerning adverse effect except for mild fatigue. Unfortunately his scan showed evidence for disease progression especially in the liver. I recommended for the patient to discontinue his current treatment with irinotecan  at this point. He started systemic chemotherapy again with carboplatin  for AUC of 5 on day 1, etoposide  100 Mg/M2 on days 1, 2 and 3 with Cosela  status post 15 cycles. I did reduce the dose of carboplatin  to AUC of 4 and etoposide  80 Mg/M2 starting from cycle #5.  He has been tolerating this treatment fairly well with no concerning complaints except for the increasing fatigue and weakness as well as the chemotherapy-induced pancytopenia. He had repeat CT scan of the chest, abdomen and pelvis performed recently.  I personally and independently reviewed the scan images and discussed the result and showed the images to the patient and his wife.  His scan showed redemonstration of the posttreatment/postradiation appearance of the right chest with dense fibrosis and consolidation of the perihilar right lung as well as a small loculated appearing right pleural effusion with associated pleural densities suggesting prior talc  pleurodesis.  There was significantly increased areas of masslike consolidation in the right lung for example in the peripheral right upper lobe and superior segment of the right lower lobe with extensive new  associated nodularity concerning for local malignant recurrence. The patient is also more symptomatic with more dry cough and shortness of breath. I recommended for him to hold any additional treatment with chemotherapy for now. The patient was seen by Dr. Boyd at [facility] and treated with tarlatamab which is a by specific T-cell engager (BiTE) for the last 8 months.  Unfortunately the repeat CT scan of the chest, abdomen and pelvis showed interval mild to moderate increase in the size of the segment 7 liver lesion in addition to interval mild increase in the size of portacaval and common hepatic artery lymph nodes with stable size retroperitoneal lymph nodes and stable size right adrenal nodule.  There was no evidence for disease progression in the chest.  There was focal enhancement in the right iliac bone concerning for metastatic disease.    Extensive Stage Small Cell Lung Cancer with Disease Progression Dominico Rod, 72, diagnosed in April 2021, has undergone multiple treatments including carboplatin , etoposide , Imfinzi , lurbinectedin , irinotecan , and radiation therapy and Tarlatamab. Disease progression noted in the liver and right iliac bone. Chronic back pain likely exacerbated by bone metastasis. Discussed treatment options: Topotecan  (significant myelosuppression risk) and carboplatin /paclitaxel (hypersensitivity risk). Patient prefers Topotecan  with potential switch to carboplatin /paclitaxel. Informed consent obtained. - Start Topotecan  regimen: 5 days in a row every 3 weeks - Add Cosela  for myeloprotection - Monitor blood counts regularly - Schedule radiation therapy for right iliac bone lesion - Coordinate with spine doctor for follow-up on January 06, 2024 - Consider weekly Topotecan  dosing if 5-day regimen is not tolerated - Discuss potential switch to carboplatin  and paclitaxel if Topotecan  is ineffective  Chronic Back Pain Back pain partially attributed to degenerative disc  disease and bone lesion in right iliac bone. Evaluated by spine specialist; further management ongoing. - Coordinate with spine doctor for  follow-up on January 06, 2024 - Consider radiation therapy for right iliac bone lesion to alleviate pain  Follow-up - Schedule follow-up appointment after first cycle of Topotecan  - Monitor blood counts regularly - Coordinate with spine doctor for follow-up on January 06, 2024.   The patient was advised to call immediately if he has any other concerning symptoms in the interval. The patient voices understanding of current disease status and treatment options and is in agreement with the current care plan.  All questions were answered. The patient knows to call the clinic with any problems, questions or concerns. We can certainly see the patient much sooner if necessary.  The total time spent in the appointment was 35 minutes.  Disclaimer: This note was dictated with voice recognition software. Similar sounding words can inadvertently be transcribed and may not be corrected upon review. Sherrod MARLA Sherrod, MD 12/24/23

## 2023-12-25 ENCOUNTER — Other Ambulatory Visit: Payer: Self-pay

## 2023-12-25 ENCOUNTER — Telehealth: Payer: Self-pay | Admitting: Primary Care

## 2023-12-25 NOTE — Telephone Encounter (Signed)
 Copied from CRM [id number]. Topic: Clinical - Medication Question >> Dec 25, 2023  9:42 AM Maisie BROCKS wrote: Reason for CRM: Katie from [facility] called and asked for someone to give her a call back to receive the diagnostic code for the prescription that was called in for Morphine .Please call and advise at [phone]

## 2023-12-25 NOTE — Telephone Encounter (Signed)
 Called and advised Katie with [facility] that Adam Wise is not the prescriber for this medication. Advised her to reach out to [facility] as this is the prescribing providers office.

## 2023-12-28 ENCOUNTER — Encounter: Payer: Self-pay | Admitting: Internal Medicine

## 2023-12-28 ENCOUNTER — Telehealth: Payer: Self-pay | Admitting: Internal Medicine

## 2023-12-28 NOTE — Progress Notes (Addendum)
Radiation Oncology         [phone] ________________________________  Name: Adam Wise        MRN: [id number]  Date of Service: 12/31/2023 DOB: 1951/03/13  QQ:[id number], Keane Scrape, NP  Si Gaul, MD     REFERRING PHYSICIAN: Si Gaul, MD   DIAGNOSIS: The encounter diagnosis was Small cell lung cancer, right upper lobe (HCC).   HISTORY OF PRESENT ILLNESS: Adam Wise is a 73 y.o. male seen at the request of Dr. Arbutus Ped who was diagnosed with extensive small cell lung cancer arising in the right upper lobe in April 2021.  He received a palliative course of radiation involving the right upper lobe overlapping into the right middle lobe. He developed progression in 2022 including progressive liver disease. He received stereotactic body radiotherapy (SBRT) in February 2024.    He has been heavily pretreated systemically; his most recent course has been tarlatamab between June and December 2024.  Recent CT imaging showed overall stable to mildly increased changes with his CT imaging for restaging on 12/13/2023 through [facility], the exception is that he has had progressive pain in the right hip.  Imaging at [facility] on 12/21/2023 with plain x-ray films showed no acute fracture, moderate hip joint space narrowing, subchondral sclerosis and osteophytes, pubic symphysis left hip and SI joints were approximated, mild left hip joint osteoarthritis and degenerative disc disease in the lower lumbar spine were noted, no focal findings were described as being concerning for malignancy in the lumbar spine MRI on 12/13/2023 but he does have severe spinal canal stenosis at L3-4 with right neural foraminal narrowing and thickening of the right exiting nerve root.  There was also description of focal enhancement on that imaging test of the right iliac bone.  He is seen to consider palliative radiation to the right iliac region. Of note he will be staring Topotecan with Dr. Arbutus Ped next Monday.   PREVIOUS  RADIATION THERAPY:   01/14/2023 through 01/27/2023 SBRT Treatment Site Technique Total Dose (Gy) Dose per Fx (Gy) Completed Fx Beam Energies  Liver: Liver Three targets in one isocenter IMRT 60/60 12 5/5 6XFFF    04/12/20-05/02/20:  The right lung target overlapping the RUL and RML was treated to 37.5 Gy in 15 fractions   PAST MEDICAL HISTORY:  Past Medical History:  Diagnosis Date   Acute on chronic respiratory failure with hypoxia (HCC) 10/12/2021   Chickenpox    Chronic knee pain    Chronic low back pain    COPD exacerbation (HCC) 10/11/2021   COPD with exacerbation (HCC) 10/12/2021   Coronary artery calcification seen on CAT scan 11/2021   Coronary CTA 11/27/2021: Moderate to large right pleural effusion and compressive atelectasis right lung base. => Coronary Calcium Score 657.  Diffuse RCA calcification.  Minimal mild disease in the LAD and diagonal branches. == Overall limited study.  Notable artifact.   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 He has disease progression after the first line of chemotherapy in December 2021.   Testosterone deficiency    Type 2 diabetes mellitus (HCC)  PAST SURGICAL HISTORY: Past Surgical History:  Procedure Laterality Date   CHEST TUBE INSERTION Right 01/20/2022   Procedure: CHEST TUBE INSERTION;  Surgeon: Lorin Glass, MD;  Location: [facility] ENDOSCOPY;  Service: Pulmonary;  Laterality: Right;  w/ Talc Pleurodesis, planned admit for Obs afterwards   CHEST TUBE INSERTION Right 04/28/2022   Procedure: INSERTION PLEURAL DRAINAGE CATHETER -  Pigtail tail, drainage;  Surgeon: Lorin Glass, MD;  Location: [facility] ENDOSCOPY;  Service: Pulmonary;  Laterality: Right;  talc pleurodesis   CHEST TUBE INSERTION Right 09/01/2022   Procedure: INSERTION PLEURAL DRAINAGE CATHETER;  Surgeon: Lupita Leash, MD;  Location: [facility] ENDOSCOPY;  Service: Cardiopulmonary;  Laterality: Right;  pigtail catheter placement with talc pleurodesis   COLONOSCOPY WITH PROPOFOL N/A 12/17/2018   Procedure: COLONOSCOPY WITH PROPOFOL;  Surgeon: Pasty Spillers, MD;  Location: [facility] ENDOSCOPY;  Service: Gastroenterology;  Laterality: N/A;   IR IMAGING GUIDED PORT INSERTION  04/17/2020   IR THORACENTESIS ASP PLEURAL SPACE W/IMG GUIDE  11/29/2021   IR THORACENTESIS ASP PLEURAL SPACE W/IMG GUIDE  01/03/2022   JOINT REPLACEMENT Bilateral    REPLACEMENT TOTAL KNEE BILATERAL  2015   TALC PLEURODESIS  09/01/2022   Procedure: Lurlean Nanny;  Surgeon: Lupita Leash, MD;  Location: [facility] ENDOSCOPY;  Service: Cardiopulmonary;;   TONSILLECTOMY  1960   TRANSTHORACIC ECHOCARDIOGRAM  05/2020   a) 05/2020: EF 55 to 60%.  No R WMA.  Mild LVH.  Indeterminate LVEDP.  Unable to assess RVP.  Normal aortic and mitral valves.  Mildly elevated RAP.; b) 09/2021: EF 50-55%. No RWMA. Mild LVH. ~ LVEDP. Mild LA Dilation. NORMAL RV/RAP.  Normal MV/AoV.     FAMILY HISTORY:  Family History  Problem Relation Age of Onset   Heart attack Mother    Cancer Mother    Hypertension Mother    Arthritis Father    Asthma Father    Cancer Father    COPD Father    Heart attack Father    Hypertension Sister    Cancer Sister    Diabetes Sister    Asthma Sister    Cancer Sister    COPD Sister    Arthritis Sister    Asthma Sister    Diabetes Sister    Birth defects Maternal Grandfather    Arthritis Paternal Grandmother    Diabetes Paternal Grandmother    Arthritis Paternal Grandfather    Asthma Son    Other Neg Hx        pituitary disorder     SOCIAL HISTORY:  reports that he has  quit smoking. He has never used smokeless tobacco. He reports that he does not currently use alcohol. He reports that he does not currently use drugs.  The patient is married and lives in [place].  He is accompanied today by his wife.    ALLERGIES: Bupropion and Hydrochlorothiazide   MEDICATIONS:  Current Outpatient Medications  Medication Sig Dispense Refill   amLODipine (NORVASC) 10 MG tablet TAKE 1 TABLET BY MOUTH EVERY DAY FOR BLOOD PRESSURE 90 tablet 2   B-D 3CC LUER-LOK SYR 22GX1" 22G X 1" 3 ML MISC USE AS INSTRUCTED FOR TESTOSTERONE INJECTION EVERY 2 WEEKS 10 each 2   Black Pepper-Turmeric (TURMERIC COMPLEX/BLACK PEPPER PO) Take 1 tablet by mouth in the morning and at bedtime.     blood glucose meter kit and supplies KIT Dispense based on patient and insurance preference. Use up to four times daily as directed. 1 each 0  Coenzyme Q10 (COQ10) 200 MG CAPS Take 200 mg by mouth at bedtime.     cyclobenzaprine (FLEXERIL) 5 MG tablet TAKE 1 TABLET BY MOUTH AT BEDTIME AS NEEDED FOR MUSCLE SPASMS. 90 tablet 0   docusate sodium (COLACE) 100 MG capsule Take 1 capsule (100 mg total) by mouth every 12 (twelve) hours. (Patient not taking: Reported on 08/10/2023) 60 capsule 0   ELIQUIS 5 MG TABS tablet TAKE 1 TABLET BY MOUTH TWICE A DAY 180 tablet 2   esomeprazole (NEXIUM) 20 MG capsule Take 20 mg by mouth in the morning.     gabapentin (NEURONTIN) 300 MG capsule TAKE 1 CAPSULE (300 MG TOTAL) BY MOUTH 2 (TWO) TIMES DAILY. FOR BACK PAIN. 180 capsule 1   glipiZIDE (GLUCOTROL) 5 MG tablet Take 0.5 tablets (2.5 mg total) by mouth 2 (two) times daily before a meal. for diabetes. 90 tablet 1   glucose blood (ONETOUCH ULTRA) test strip USE UP TO 4 TIMES DAILY AS DIRECTED 400 strip 5   Insulin Glargine (BASAGLAR KWIKPEN) 100 UNIT/ML INJECT 24 UNITS INTO THE SKIN AT BEDTIME. FOR DIABETES. (Patient taking differently: Inject 0-24 Units into the skin See admin instructions. Inject 24 units into the  skin at bedtime and PER SLIDING SCALE in the morning AS NEEDED FOR AN ELEVATED BGL, possibly caused by other prescribed medications) 30 mL 1   Insulin Pen Needle (BD PEN NEEDLE NANO U/F) 32G X 4 MM MISC Use with insulin as prescribed Dx Code: E11.9 100 each 3   ipratropium-albuterol (DUONEB) 0.5-2.5 (3) MG/3ML SOLN Take 3 mLs by nebulization every 4 (four) hours while awake for 3 days, THEN 3 mLs every 4 (four) hours as needed (shortness of breath or wheezing). 360 mL 0   KRILL OIL PO Take 1 capsule by mouth daily.     Lancets MISC USE UP TO 3 TIMES DAILY AS DIRECTED 100 each 2   lidocaine (LIDODERM) 5 % Place 1 patch onto the skin daily. Remove & Discard patch within 12 hours or as directed by MD 30 patch 0   losartan (COZAAR) 50 MG tablet TAKE 1 TABLET (50 MG TOTAL) BY MOUTH DAILY. FOR BLOOD PRESSURE. 90 tablet 3   Melatonin 10 MG CAPS Take 10 mg by mouth at bedtime as needed (for sleep).     metFORMIN (GLUCOPHAGE) 1000 MG tablet TAKE 1 TABLET (1,000 MG TOTAL) BY MOUTH 2 (TWO) TIMES DAILY WITH A MEAL. FOR DIABETES 180 tablet 0   mirtazapine (REMERON SOL-TAB) 15 MG disintegrating tablet Take 0.5 tablets (7.5 mg total) by mouth at bedtime. 15 tablet 1   morphine (MS CONTIN) 30 MG 12 hr tablet Take 30 mg by mouth 2 (two) times daily.     Multiple Vitamin (MULTI-VITAMINS) TABS Take 1 tablet by mouth daily with breakfast.     Naloxone HCl 3 MG/0.1ML LIQD Place 1 spray into both nostrils once. For known/suspected opiod overdose. Every 2-3 minutes in alternating nostril till EMS arrives.     ondansetron (ZOFRAN) 4 MG tablet Take 1 tablet (4 mg total) by mouth every 8 (eight) hours as needed for nausea or vomiting. 30 tablet 0   oxyCODONE-acetaminophen (PERCOCET) 10-325 MG tablet Take 1 tablet by mouth every 4 (four) hours as needed.     pioglitazone (ACTOS) 45 MG tablet TAKE 1 TABLET (45 MG TOTAL) BY MOUTH DAILY. FOR DIABETES. 90 tablet 1   pravastatin (PRAVACHOL) 40 MG tablet TAKE 1 TABLET BY MOUTH EVERY  DAY IN THE EVENING FOR CHOLESTEROL 90  tablet 3   PRESCRIPTION MEDICATION CPAP- At bedtime     testosterone cypionate (DEPOTESTOSTERONE CYPIONATE) 200 MG/ML injection Inject 200 mg into the muscle every 14 (fourteen) days.     No current facility-administered medications for this visit.     REVIEW OF SYSTEMS: On review of systems, the patient reports that that he is having difficulty with pain in his right hip, and low back extending from his low back down the side of his leg and crossing along the thigh medially. He is taking short and long acting narcotics as well as gabapentin, and flexiril. He is also using a heating pad most days which also can help. No other complaints are verbalized.   PHYSICAL EXAM:  Wt Readings from Last 3 Encounters:  12/24/23 263 lb 0.8 oz (119.3 kg)  08/10/23 283 lb (128.4 kg)  08/02/23 285 lb (129.3 kg)   Temp Readings from Last 3 Encounters:  12/24/23 97.6 F (36.4 C) (Oral)  08/15/23 98.1 F (36.7 C) (Oral)  08/02/23 98.2 F (36.8 C)   BP Readings from Last 3 Encounters:  12/24/23 (!) 103/91  08/15/23 112/71  08/02/23 111/72   Pulse Readings from Last 3 Encounters:  12/24/23 98  08/15/23 87  08/02/23 (!) 110   In general this is a well appearing Caucasian male in no acute distress.  He's alert and oriented x4 and appropriate throughout the examination. Cardiopulmonary assessment is negative for acute distress and he exhibits normal effort.     ECOG = 1  0 - Asymptomatic (Fully active, able to carry on all predisease activities without restriction)  1 - Symptomatic but completely ambulatory (Restricted in physically strenuous activity but ambulatory and able to carry out work of a light or sedentary nature. For example, light housework, office work)  2 - Symptomatic, <50% in bed during the day (Ambulatory and capable of all self care but unable to carry out any work activities. Up and about more than 50% of waking hours)  3 - Symptomatic,  >50% in bed, but not bedbound (Capable of only limited self-care, confined to bed or chair 50% or more of waking hours)  4 - Bedbound (Completely disabled. Cannot carry on any self-care. Totally confined to bed or chair)  5 - Death   Santiago Glad MM, Creech RH, Tormey DC, et al. [phone]). "Toxicity and response criteria of the [facility]". Am. Evlyn Clines. Oncol. 5 (6): 649-55    LABORATORY DATA:  Lab Results  Component Value Date   WBC 6.2 12/24/2023   HGB 11.5 (L) 12/24/2023   HCT 36.8 (L) 12/24/2023   MCV 93.6 12/24/2023   PLT 225 12/24/2023   Lab Results  Component Value Date   NA 140 12/24/2023   K 4.8 12/24/2023   CL 102 12/24/2023   CO2 32 12/24/2023   Lab Results  Component Value Date   ALT 13 12/24/2023   AST 22 12/24/2023   ALKPHOS 76 12/24/2023   BILITOT 0.4 12/24/2023      RADIOGRAPHY: No results found.      IMPRESSION/PLAN: 1. Progressive metastatic extensive stage small cell carcinoma arising in the right chest overlapping the right upper lobe and right middle lobe and hilum with liver and bone disease. We reviewed the patient's course since his last visit.  Dr. Mitzi Hansen discussed the rationale for palliative radiation to his right iliac region based on the findings from his MRI and symptoms.  We discussed the risks, benefits, short, and long term effects of radiotherapy,  as well as the palliative intent, and the patient is interested in proceeding.  We reviewed the delivery and logistics of radiotherapy and I recommend 3 weeks of treatment. Written consent is obtained and placed in the chart, a copy was provided to the patient. He will simulate today.  2. Risks of brain disease from small cell lung cancer.  We have spoken with the patient on this topic previously and he has desired to forego prophylactic cranial irradiation out of concerns for cognitive deficits that could be caused by this treatment.  He prefers to avoid routine brain mri as well. His  last was negative for disease in July 2024.  3. Right L3-4 nerve root compression. The patient has symptoms that overlap this area as well as his right iliac region. We discussed following up about 2-3 weeks after he's finished radiation and if he still has symptoms in this dermatome, we will refer him to interventional anesthesia.   In a visit lasting 60 minutes, greater than 50% of the time was spent face to face discussing the patient's condition, in preparation for the discussion, and coordinating the patient's care. I was involved remotely via secure video visit.   The above documentation reflects my direct findings during this shared patient visit. Please see the separate note by Dr. Mitzi Hansen on this date for the remainder of the patient's plan of care.     Osker Mason, PAC

## 2023-12-30 ENCOUNTER — Other Ambulatory Visit: Payer: Self-pay | Admitting: Medical Oncology

## 2023-12-30 DIAGNOSIS — C3411 Malignant neoplasm of upper lobe, right bronchus or lung: Secondary | ICD-10-CM

## 2023-12-30 NOTE — Progress Notes (Signed)
Histology and Location of Primary Cancer: Small Cell Lung Cancer metastatic to liver and bone.  Patient was seen by the medical oncology team with complaints of back pain.  Imaging obtained and noted a lesion in the right iliac bone.  Location(s) of Symptomatic Metastases: Right Iliac Bone   Past/Anticipated chemotherapy by medical oncology, if any:  Dr. Arbutus Ped 12/24/2023 - Coordinate with spine doctor for follow-up on January 06, 2024 - Consider radiation therapy for right iliac bone lesion to alleviate pain   Pain on a scale of 0-10 is:  Back and Right hip 8/10, taking Oxycodone and Morphine.  He reports this pain has been chronic for about 2 years.   Ambulatory status? Walker? Wheelchair?: Ambulatory with walker and cane.  SAFETY ISSUES: Prior radiation? Right Upper Lobe Lung 15 fractions,Liver 5 fractions 1/31-2/13/2024 Pacemaker/ICD? No Possible current pregnancy? N/a Is the patient on methotrexate? No  Current Complaints / other details:

## 2023-12-31 ENCOUNTER — Ambulatory Visit
Admission: RE | Admit: 2023-12-31 | Discharge: 2023-12-31 | Disposition: A | Discharge status: Home / Self Care | Payer: Self-pay | Source: Ambulatory Visit | Attending: Radiation Oncology | Admitting: Radiation Oncology

## 2023-12-31 ENCOUNTER — Other Ambulatory Visit: Payer: Self-pay | Admitting: Radiation Oncology

## 2023-12-31 ENCOUNTER — Ambulatory Visit
Admission: RE | Admit: 2023-12-31 | Discharge: 2023-12-31 | Disposition: A | Discharge status: Home / Self Care | Payer: Medicare Other | Source: Ambulatory Visit | Attending: Radiation Oncology | Admitting: Radiation Oncology

## 2023-12-31 ENCOUNTER — Other Ambulatory Visit: Payer: Self-pay | Admitting: Primary Care

## 2023-12-31 VITALS — BP 121/86 | HR 118 | Temp 97.7°F | Resp 20 | Ht 69.0 in | Wt 259.8 lb

## 2023-12-31 DIAGNOSIS — Z794 Long term (current) use of insulin: Secondary | ICD-10-CM | POA: Diagnosis not present

## 2023-12-31 DIAGNOSIS — J449 Chronic obstructive pulmonary disease, unspecified: Secondary | ICD-10-CM | POA: Insufficient documentation

## 2023-12-31 DIAGNOSIS — I10 Essential (primary) hypertension: Secondary | ICD-10-CM | POA: Insufficient documentation

## 2023-12-31 DIAGNOSIS — G4733 Obstructive sleep apnea (adult) (pediatric): Secondary | ICD-10-CM | POA: Insufficient documentation

## 2023-12-31 DIAGNOSIS — R4689 Other symptoms and signs involving appearance and behavior: Secondary | ICD-10-CM

## 2023-12-31 DIAGNOSIS — C787 Secondary malignant neoplasm of liver and intrahepatic bile duct: Secondary | ICD-10-CM | POA: Insufficient documentation

## 2023-12-31 DIAGNOSIS — J9621 Acute and chronic respiratory failure with hypoxia: Secondary | ICD-10-CM | POA: Insufficient documentation

## 2023-12-31 DIAGNOSIS — C3411 Malignant neoplasm of upper lobe, right bronchus or lung: Secondary | ICD-10-CM | POA: Insufficient documentation

## 2023-12-31 DIAGNOSIS — C7951 Secondary malignant neoplasm of bone: Secondary | ICD-10-CM | POA: Insufficient documentation

## 2023-12-31 DIAGNOSIS — C772 Secondary and unspecified malignant neoplasm of intra-abdominal lymph nodes: Secondary | ICD-10-CM | POA: Insufficient documentation

## 2023-12-31 DIAGNOSIS — E785 Hyperlipidemia, unspecified: Secondary | ICD-10-CM | POA: Insufficient documentation

## 2023-12-31 DIAGNOSIS — Z7984 Long term (current) use of oral hypoglycemic drugs: Secondary | ICD-10-CM | POA: Insufficient documentation

## 2023-12-31 DIAGNOSIS — G8929 Other chronic pain: Secondary | ICD-10-CM | POA: Diagnosis not present

## 2023-12-31 DIAGNOSIS — Z809 Family history of malignant neoplasm, unspecified: Secondary | ICD-10-CM | POA: Insufficient documentation

## 2023-12-31 DIAGNOSIS — Z87891 Personal history of nicotine dependence: Secondary | ICD-10-CM | POA: Insufficient documentation

## 2023-12-31 DIAGNOSIS — Z510 Encounter for antineoplastic radiation therapy: Secondary | ICD-10-CM | POA: Diagnosis not present

## 2023-12-31 DIAGNOSIS — Z7901 Long term (current) use of anticoagulants: Secondary | ICD-10-CM | POA: Diagnosis not present

## 2023-12-31 DIAGNOSIS — Z923 Personal history of irradiation: Secondary | ICD-10-CM | POA: Diagnosis not present

## 2023-12-31 DIAGNOSIS — I2510 Atherosclerotic heart disease of native coronary artery without angina pectoris: Secondary | ICD-10-CM | POA: Insufficient documentation

## 2023-12-31 DIAGNOSIS — K219 Gastro-esophageal reflux disease without esophagitis: Secondary | ICD-10-CM | POA: Insufficient documentation

## 2023-12-31 DIAGNOSIS — Z79899 Other long term (current) drug therapy: Secondary | ICD-10-CM | POA: Insufficient documentation

## 2023-12-31 DIAGNOSIS — E119 Type 2 diabetes mellitus without complications: Secondary | ICD-10-CM | POA: Diagnosis not present

## 2023-12-31 NOTE — Radiation Completion Notes (Signed)
Patient Name: Adam Wise, Adam Wise MRN: [id number] Date of Birth: 1951/04/21 Referring Physician: Si Gaul, M.D. Date of Service: 2023-12-31 Radiation Oncologist: Dorothy Puffer, M.D. 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 Staging on 2020-12-25: Small cell lung cancer, right upper lobe (HCC) T=cT4, N=cN3, M=cM1c Intent: Curative     ==========DELIVERED PLANS==========  First Treatment Date: 2023-01-14 Last Treatment Date: 2023-01-27   Plan Name: Liver_SBRT Site: Liver Technique: SBRT/SRT-IMRT Mode: Photon Dose Per Fraction: 12 Gy Prescribed Dose (Delivered / Prescribed): 12 Gy / 12 Gy Prescribed Fxs (Delivered / Prescribed): 1 / 1   Plan Name: Liver_SBRT:1 Site: Liver Technique: SBRT/SRT-IMRT Mode: Photon Dose Per Fraction: 12 Gy Prescribed Dose (Delivered / Prescribed): 48 Gy / 48 Gy Prescribed Fxs (Delivered / Prescribed): 4 / 4     ==========ON TREATMENT VISIT DATES========== 2023-01-14, 2023-01-19, 2023-01-21, 2023-01-23, 2023-01-23, 2023-01-27     ==========UPCOMING VISITS==========       ==========APPENDIX - ON TREATMENT VISIT NOTES==========   See weekly On Treatment Notes in Epic for details in the Media tab (listed as Progress notes on the On Treatment Visit Dates listed above).

## 2024-01-01 ENCOUNTER — Other Ambulatory Visit (INDEPENDENT_AMBULATORY_CARE_PROVIDER_SITE_OTHER): Payer: Medicare Other

## 2024-01-01 DIAGNOSIS — R4689 Other symptoms and signs involving appearance and behavior: Secondary | ICD-10-CM

## 2024-01-01 LAB — POC URINALSYSI DIPSTICK (AUTOMATED)
Bilirubin, UA: NEGATIVE
Blood, UA: NEGATIVE
Glucose, UA: NEGATIVE
Ketones, UA: NEGATIVE
Leukocytes, UA: NEGATIVE
Nitrite, UA: NEGATIVE
Protein, UA: POSITIVE — AB
Spec Grav, UA: 1.025 (ref 1.010–1.025)
Urobilinogen, UA: 1 U/dL
pH, UA: 5.5 (ref 5.0–8.0)

## 2024-01-01 NOTE — Progress Notes (Unsigned)
[facility] OFFICE PROGRESS NOTE  Doreene Nest, NP [address] [place] [address]  DIAGNOSIS: Relapsed extensive stage (T4, N3, M1c)  small cell lung cancer diagnosed in April 2021 and presented with large right upper lobe/right hilar mass with ipsilateral and contralateral mediastinal and right supraclavicular lymphadenopathy in addition to multiple liver lesions. He was initially diagnosed in December 2021.   PRIOR THERAPY: 1) Palliative radiotherapy to the right upper lobe lung mass under the care of Dr. Mitzi Hansen. 2) Systemic chemotherapy with carboplatin for AUC of 5 on day 1, etoposide 100 mg/M2 on days 1, 2 and 3 in addition to Imfinzi 1500 mg IV every 3 weeks with chemotherapy then every 4 weeks for maintenance if the patient has no evidence for progression.  He will also receive Cosela 240 mg/m2 on the days of the chemotherapy.  Status post 9 cycles.  Starting from cycle #5 the patient will be on maintenance treatment with immunotherapy with Imfinzi 1500 mg IV every 4 weeks. Last dose of chemotherapy was given on November 13, 2020. This treatment was discontinued secondary to disease progression 3) Systemic chemotherapy with carboplatin for AUC of 5 on day 1, etoposide 100 mg/M2 on days 1, 2 and 3, Tecentriq 1200 mg IV every 3 weeks as well as Cosela 250 mg/M2 on the days of the chemotherapy every 3 weeks.  First dose December 18, 2020.  Status post 8 cycles. 4) Zepzelca (lurbinectedin) 3.2 mgm2 IV every 3 weeks. Last dose on 01/23/22. Status post 12 cycles.  5) Palliative systemic chemotherapy with irinotecan 65 mg/m2 on days 1 and 8 IV every 3 weeks.  Status post 3 cycles.  Last dose was given April 01, 2022 discontinued secondary to disease progression. 6) SBRT to the progressive liver lesions under the care of Dr. Mitzi Hansen.  Last fraction January 27, 2023. 7) systemic chemotherapy with carboplatin for AUC of 5 on day 1, etoposide 100 Mg/M2 on days 1, 2 and 3 with Cosela  before the chemotherapy.  First dose expected to start on 05/28/2022.  Status post 15 cycles.  Starting from cycle #5 his carboplatin will be reduced to AUC of 4 and 2 etoposide 80 Mg/M2.  Last dose was giving April 07, 2023 discontinued for suspicious disease progression. 8) tarlatamab (6/17-12/29) at [facility]  CURRENT THERAPY: 1) Topotecan 1.2 mg/M2 on days 1-5 every 3 weeks with Cosela 240 Mg/M2 IV before the chemotherapy.  First dose January 04, 2024.  2) palliative radiation to the right hip under the care of Dr. Mitzi Hansen, plan to occur from 01/12/24-01/01/24.   INTERVAL HISTORY: Adam Wise 73 y.o. male returns to the clinic today for a follow-up visit accompanied by his wife and daughter.  In summary, the patient has extensive stage small cell lung cancer.  He was undergoing treatment at [facility] but was recently found to have disease progression in December 2024.  Therefore, he is starting treatment again locally here at the [facility] with topotecan days 1 through 5 IV every 3 weeks.  He is expected to undergo his first dose today.  He is also undergoing palliative radiation to the right hip starting on 01/12/24.   The patient is feeling unwell today. The patient slept most of the day yesterday due to weakness and lethargy.  Him and his wife have had upper respiratory infection the last week or so.  He did not test himself for flu or COVID.  He denies any fever but reports chills,  shortness of breath, nasal congestion, coughing up brown sputum, dyspnea.  Has been trying to drink plenty of fluid but reports his urine is dark.  The patient's wife sent a MyChart message reporting that the patient has been more confused.  Because of his confusion state dropped off a urinalysis specimen his PCPs office which was negative for UTI.  The patient does have extensive stage small cell lung cancer and has not had any repeat neuroimaging since July 2024.  Denies any speech changes extremity weakness.  The  patient is tachycardic today.  He denies any history of arrhythmias.  He has tried taking Mucinex and Tylenol Sinus.  He was scheduled for cycle #1 of topotecan      MEDICAL HISTORY: Past Medical History:  Diagnosis Date   Acute on chronic respiratory failure with hypoxia (HCC) 10/12/2021   Chickenpox    Chronic knee pain    Chronic low back pain    COPD exacerbation (HCC) 10/11/2021   COPD with exacerbation (HCC) 10/12/2021   Coronary artery calcification seen on CAT scan 11/2021   Coronary CTA 11/27/2021: Moderate to large right pleural effusion and compressive atelectasis right lung base. => Coronary Calcium Score 657.  Diffuse RCA calcification.  Minimal mild disease in the LAD and diagonal branches. == Overall limited study.  Notable artifact.   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 He has disease progression after the first line of chemotherapy in December 2021.   Testosterone deficiency    Type 2 diabetes mellitus (HCC)     ALLERGIES:  is allergic to bupropion and hydrochlorothiazide.  MEDICATIONS:  Current Outpatient Medications  Medication Sig Dispense Refill   amLODipine (NORVASC) 10 MG tablet TAKE 1 TABLET BY MOUTH EVERY DAY FOR BLOOD PRESSURE 90 tablet 2   B-D 3CC LUER-LOK SYR 22GX1" 22G X 1" 3 ML MISC USE AS INSTRUCTED FOR TESTOSTERONE INJECTION EVERY 2 WEEKS 10 each 2   Black Pepper-Turmeric (TURMERIC COMPLEX/BLACK PEPPER PO) Take 1 tablet by mouth in the morning and at bedtime.     blood glucose meter kit and supplies KIT Dispense based on patient and insurance  preference. Use up to four times daily as directed. 1 each 0   Coenzyme Q10 (COQ10) 200 MG CAPS Take 200 mg by mouth at bedtime.     cyclobenzaprine (FLEXERIL) 5 MG tablet TAKE 1 TABLET BY MOUTH AT BEDTIME AS NEEDED FOR MUSCLE SPASMS. 90 tablet 0   docusate sodium (COLACE) 100 MG capsule Take 1 capsule (100 mg total) by mouth every 12 (twelve) hours. (Patient not taking: Reported on 08/10/2023) 60 capsule 0   ELIQUIS 5 MG TABS tablet TAKE 1 TABLET BY MOUTH TWICE A DAY 180 tablet 2   esomeprazole (NEXIUM) 20 MG capsule Take 20 mg by mouth in the morning.     gabapentin (NEURONTIN) 300 MG capsule TAKE 1 CAPSULE (300 MG TOTAL) BY MOUTH 2 (TWO) TIMES DAILY. FOR BACK PAIN. 180 capsule 1   glipiZIDE (GLUCOTROL) 5 MG tablet Take 0.5 tablets (2.5 mg total) by mouth 2 (two) times daily before a meal. for diabetes. 90 tablet 1  glucose blood (ONETOUCH ULTRA) test strip USE UP TO 4 TIMES DAILY AS DIRECTED 400 strip 5   Insulin Glargine (BASAGLAR KWIKPEN) 100 UNIT/ML INJECT 24 UNITS INTO THE SKIN AT BEDTIME. FOR DIABETES. (Patient taking differently: Inject 0-24 Units into the skin See admin instructions. Inject 24 units into the skin at bedtime and PER SLIDING SCALE in the morning AS NEEDED FOR AN ELEVATED BGL, possibly caused by other prescribed medications) 30 mL 1   Insulin Pen Needle (BD PEN NEEDLE NANO U/F) 32G X 4 MM MISC Use with insulin as prescribed Dx Code: E11.9 100 each 3   ipratropium-albuterol (DUONEB) 0.5-2.5 (3) MG/3ML SOLN Take 3 mLs by nebulization every 4 (four) hours while awake for 3 days, THEN 3 mLs every 4 (four) hours as needed (shortness of breath or wheezing). 360 mL 0   KRILL OIL PO Take 1 capsule by mouth daily.     Lancets MISC USE UP TO 3 TIMES DAILY AS DIRECTED 100 each 2   lidocaine (LIDODERM) 5 % Place 1 patch onto the skin daily. Remove & Discard patch within 12 hours or as directed by MD 30 patch 0   losartan (COZAAR) 50 MG tablet TAKE 1 TABLET (50 MG TOTAL) BY MOUTH DAILY. FOR  BLOOD PRESSURE. 90 tablet 3   Melatonin 10 MG CAPS Take 10 mg by mouth at bedtime as needed (for sleep).     metFORMIN (GLUCOPHAGE) 1000 MG tablet TAKE 1 TABLET (1,000 MG TOTAL) BY MOUTH 2 (TWO) TIMES DAILY WITH A MEAL. FOR DIABETES 180 tablet 0   morphine (MS CONTIN) 30 MG 12 hr tablet Take 30 mg by mouth 2 (two) times daily.     Multiple Vitamin (MULTI-VITAMINS) TABS Take 1 tablet by mouth daily with breakfast.     Naloxone HCl 3 MG/0.1ML LIQD Place 1 spray into both nostrils once. For known/suspected opiod overdose. Every 2-3 minutes in alternating nostril till EMS arrives.     ondansetron (ZOFRAN) 4 MG tablet Take 1 tablet (4 mg total) by mouth every 8 (eight) hours as needed for nausea or vomiting. 30 tablet 0   oxyCODONE-acetaminophen (PERCOCET) 10-325 MG tablet Take 1 tablet by mouth every 4 (four) hours as needed.     pioglitazone (ACTOS) 45 MG tablet TAKE 1 TABLET (45 MG TOTAL) BY MOUTH DAILY. FOR DIABETES. 90 tablet 1   pravastatin (PRAVACHOL) 40 MG tablet TAKE 1 TABLET BY MOUTH EVERY DAY IN THE EVENING FOR CHOLESTEROL 90 tablet 3   PRESCRIPTION MEDICATION CPAP- At bedtime     testosterone cypionate (DEPOTESTOSTERONE CYPIONATE) 200 MG/ML injection Inject 200 mg into the muscle every 14 (fourteen) days.     No current facility-administered medications for this visit.    SURGICAL HISTORY:  Past Surgical History:  Procedure Laterality Date   CHEST TUBE INSERTION Right 01/20/2022   Procedure: CHEST TUBE INSERTION;  Surgeon: Lorin Glass, MD;  Location: [facility] ENDOSCOPY;  Service: Pulmonary;  Laterality: Right;  w/ Talc Pleurodesis, planned admit for Obs afterwards   CHEST TUBE INSERTION Right 04/28/2022   Procedure: INSERTION PLEURAL DRAINAGE CATHETER - Pigtail tail, drainage;  Surgeon: Lorin Glass, MD;  Location: [facility] ENDOSCOPY;  Service: Pulmonary;  Laterality: Right;  talc pleurodesis   CHEST TUBE INSERTION Right 09/01/2022   Procedure: INSERTION PLEURAL DRAINAGE CATHETER;  Surgeon:  Lupita Leash, MD;  Location: [facility] ENDOSCOPY;  Service: Cardiopulmonary;  Laterality: Right;  pigtail catheter placement with talc pleurodesis   COLONOSCOPY WITH PROPOFOL N/A 12/17/2018   Procedure: COLONOSCOPY  WITH PROPOFOL;  Surgeon: Pasty Spillers, MD;  Location: [facility] ENDOSCOPY;  Service: Gastroenterology;  Laterality: N/A;   IR IMAGING GUIDED PORT INSERTION  04/17/2020   IR THORACENTESIS ASP PLEURAL SPACE W/IMG GUIDE  11/29/2021   IR THORACENTESIS ASP PLEURAL SPACE W/IMG GUIDE  01/03/2022   JOINT REPLACEMENT Bilateral    REPLACEMENT TOTAL KNEE BILATERAL  2015   TALC PLEURODESIS  09/01/2022   Procedure: Lurlean Nanny;  Surgeon: Lupita Leash, MD;  Location: [facility] ENDOSCOPY;  Service: Cardiopulmonary;;   TONSILLECTOMY  1960   TRANSTHORACIC ECHOCARDIOGRAM  05/2020   a) 05/2020: EF 55 to 60%.  No R WMA.  Mild LVH.  Indeterminate LVEDP.  Unable to assess RVP.  Normal aortic and mitral valves.  Mildly elevated RAP.; b) 09/2021: EF 50-55%. No RWMA. Mild LVH. ~ LVEDP. Mild LA Dilation. NORMAL RV/RAP.  Normal MV/AoV.    REVIEW OF SYSTEMS:   Review of Systems  Constitutional: Positive for fatigue, chills, and weakness. Negative for appetite change, fever and unexpected weight change.  HENT:  Positive for nasal congestion. Negative for mouth sores, nosebleeds, sore throat and trouble swallowing.   Eyes: Negative for eye problems and icterus.  Respiratory: Positive for cough and shortness of breath. Negative for hemoptysis and wheezing.   Cardiovascular: Negative for chest pain and leg swelling.  Gastrointestinal: Negative for abdominal pain, constipation, diarrhea, nausea and vomiting.  Genitourinary: Negative for bladder incontinence, difficulty urinating, dysuria, frequency and hematuria.   Musculoskeletal: Positive for back and hip pain. Negative for gait problem, neck pain and neck stiffness.  Skin: Negative for itching and rash.  Neurological: Positive for headaches/sinus pressure.  Negative for dizziness, extremity weakness, gait problem, headaches, light-headedness and seizures.  Hematological: Negative for adenopathy. Does not bruise/bleed easily.  Psychiatric/Behavioral: Positive for intermittent confusion per his wife. Negative for depression and sleep disturbance. The patient is not nervous/anxious.     PHYSICAL EXAMINATION:  Blood pressure 116/71, pulse (!) 136, temperature 98.3 F (36.8 C), temperature source Temporal, resp. rate 19, weight 252 lb 12.8 oz (114.7 kg), SpO2 93%.  ECOG PERFORMANCE STATUS: 2  Physical Exam  Constitutional: Oriented to person, place, and time and well-developed, well-nourished, and in no distress. He appears to be feeling unwell today though and not at his baseline.  HENT:  Head: Normocephalic and atraumatic.  Mouth/Throat: Oropharynx is clear and moist. No oropharyngeal exudate.  Eyes: Conjunctivae are normal. Right eye exhibits no discharge. Left eye exhibits no discharge. No scleral icterus.  Neck: Normal range of motion. Neck supple.  Cardiovascular: Tachycardic, regular rhythm, normal heart sounds and intact distal pulses.   Pulmonary/Chest: Effort normal. Some wheezing and rhonchi, especially in right lung. No respiratory distress.  Abdominal: Soft. Bowel sounds are normal. Exhibits no distension and no mass. There is no tenderness.  Musculoskeletal: Examined in wheelchair due to weakness today. Exhibits no edema.  Lymphadenopathy:    No cervical adenopathy.  Neurological: Alert and oriented to person, place, and time. Exhibits normal muscle tone. Examined in wheelchair today due to weakness.  Skin: Skin is warm and dry. No rash noted. Not diaphoretic. No erythema. No pallor.  Psychiatric: Mood, memory and judgment normal.  Vitals reviewed.  LABORATORY DATA: Lab Results  Component Value Date   WBC 9.5 01/04/2024   HGB 10.8 (L) 01/04/2024   HCT 33.2 (L) 01/04/2024   MCV 88.8 01/04/2024   PLT 271 01/04/2024       Chemistry      Component Value Date/Time   NA  135 01/04/2024 0747   K 4.1 01/04/2024 0747   CL 99 01/04/2024 0747   CO2 25 01/04/2024 0747   BUN 13 01/04/2024 0747   CREATININE 1.09 01/04/2024 0747   CREATININE 1.45 (H) 10/18/2021 1533      Component Value Date/Time   CALCIUM 9.0 01/04/2024 0747   ALKPHOS 99 01/04/2024 0747   AST 21 01/04/2024 0747   ALT 17 01/04/2024 0747   BILITOT 0.7 01/04/2024 0747       RADIOGRAPHIC STUDIES:  No results found.   ASSESSMENT/PLAN:  This is a very pleasant 73 year old Caucasian male diagnosed with extensive stage (T4, N3, M1C) small cell lung cancer presented with large right upper lobe lung mass in addition to mediastinal and right supraclavicular lymphadenopathy and multiple metastatic liver lesions diagnosed in April 2021.   The patient initially underwent systemic chemotherapy with carboplatin for an AUC of 5 on day 1, etoposide 100 mg per metered squared on days 1, 2, and 3 in addition to [facility] for myeloprotection.  He also received immunotherapy with Imfinzi on day 1 of every chemotherapy cycle.  He is status post 8 cycles.  Starting from cycle #5 he was on single agent immunotherapy with Imfinzi IV every 4 weeks   The patient had evidence of disease progression on his scan from December 2021.   He was then started on systemic chemotherapy with carboplatin for an AUC of 5 on day 1, etoposide 100 mg per metered squared on days 1, 2, and 3 in addition to Tecentriq 1200 mg on day 1, the patient is status post 8 cycles.  Starting from cycle #5, the patient started maintenance immunotherapy with Tecentriq.  This was discontinued due to evidence of disease progression.   The patient then underwent Zepzelca IV every 3 weeks.  He is status post his 12 cycles and he tolerated it well except for fatigue a few days after treatment.  This was discontinued in February 2023 due to evidence of disease progression.    The patient then underwent  treatment with irinotecan 65 mg per metered squared on days 1 and 8 IV every 3 weeks.  The patient is status post 3 cycles and tolerated it fairly well without any concerning adverse side effects.  This was discontinued secondary to disease progression.   Dr. Arbutus Ped saw the patient after this to discuss the options including palliative care and hospice versus single agent Gemzar, or Taxol.  He also discussed repeating his initial treatment with carboplatin and etoposide since he had a good response to treatment in the past.   He was referred to Dr. Janann August at [facility]  for consideration of enrollment in the clinical trial with tarlatamab.    He also saw Dr. Loney Hering at [facility] for consideration of enrollment in the [place] [id number] trial.  The patient was not interested due to the travel burden.   The patient was interested in restarting systemic chemotherapy with carboplatin for an AUC of 5 on day 1 and etoposide 100 mg per metered squared on days 1, 2, and 3 with Cosela. He is status post 14 cycles and tolerated it well. His dose of carboplatin was reduced to an AUC of 4 and etoposide 80 mg per metered squared.     He underwent SBRT to this which was completed on 01/27/23.   The patient was seen by Dr. Janann August at [facility] and treated with tarlatamab which is a by specific T-cell engager (BiTE) for the last  8 months.  Unfortunately the repeat CT scan of the chest, abdomen and pelvis in December 2024 showed interval mild to moderate increase in the size of the segment 7 liver lesion in addition to interval mild increase in the size of portacaval and common hepatic artery lymph nodes with stable size retroperitoneal lymph nodes and stable size right adrenal nodule.  There was no evidence for disease progression in the chest.  There was focal enhancement in the right iliac bone concerning for metastatic disease.   Patient was seen back in the clinic on 12/24/2023.  Dr. Arbutus Ped recommended Topotecan  regimen: 5 days in a row every 3 weeks.  He is expected to start his first cycle of treatment today on 01/04/2024 with Cosela for myelo protection.  The patient was seen with Dr. Arbutus Ped today.  The patient is well-known to [place] and has been coming to the clinic for several years.  The patient seems to be feeling unwell today.  He is having a lot of weakness and slept most of the day. He is tachycardic. He also is having cough, shortness of breath, and congestion. He also has not had any repeat neuro-imaging since July 2024.   We would recommend ER evaluation for consideration of more thorough and immediate evaluation.  Pain recommend flu, COVID, and RSV swab, lung imaging, consideration of repeat brain MRI due to his confusion and extensive stage small cell lung cancer with recent progression, EKG, and possible IV fluids  The patient is not in physical shape for cycle #1 of chemotherapy today.  Therefore we will delay his treatment by 1 week.  We will see him back next week for evaluation prior to starting treatment to ensure improvement in his overall condition.  He is also planning on undergoing palliative radiation to the right iliac bone which is starting on 01/12/2024.  We will arrange for weekly labs to monitor his blood work closely.  Per Dr. Asa Lente last note he may consider switching him to carboplatin and paclitaxel if topotecan is ineffective.  The patient is expected to see a spine doctor on 01/06/24  The patient was advised to call immediately if he has any concerning symptoms in the interval. The patient voices understanding of current disease status and treatment options and is in agreement with the current care plan. All questions were answered. The patient knows to call the clinic with any problems, questions or concerns. We can certainly see the patient much sooner if necessary  Orders Placed This Encounter  Procedures   CBC with Differential (Cancer Center Only)    Standing  Status:   Future    Expected Date:   01/11/2024    Expiration Date:   01/10/2025   CMP (Cancer Center only)    Standing Status:   Future    Expected Date:   01/11/2024    Expiration Date:   01/10/2025      Loy Mccartt L Stephano Arrants, PA-C 01/04/24  ADDENDUM: Hematology/Oncology Attending: I had a face-to-face encounter with the patient today.  I reviewed his record, lab and recommended his care plan.  This is a very pleasant 73 years old white male with extensive stage small cell lung cancer that was initially diagnosed in April 2021 status post several treatment in the past including systemic chemotherapy with carboplatin, etoposide and Imfinzi in addition to Myeloprotection with Cosela followed by maintenance Imfinzi.  After disease progression he was treated again with systemic chemotherapy with carboplatin, etoposide and Tecentriq.  This was discontinued secondary  to disease progression and the patient treated with Lurbinectedin for 12 cycles before it was discontinued secondary to disease progression.  He was also treated with single agent irinotecan for 3 cycles discontinued secondary to disease progression.  In the meantime he received palliative radiotherapy and then on progression he also received another course of systemic chemotherapy with carboplatin and etoposide followed by disease progression followed by treatment with Tarlatamab at [facility] for 6 months discontinued secondary to disease progression in December 2024. The patient was supposed to start systemic chemotherapy again with single agent topotecan with Cosela Myeloprotection today but he presented with significant weakness and fatigue as well as cough and chest congestion.  He also has significant lethargy and confusion at times. The patient did not meet the parameters for treatment today and he was advised to go to the emergency department for further evaluation of his progressive weakness and fatigue as well as the dizzy  spells and mental status change. We will delay the start of his systemic therapy by 1 week until improvement of his condition. The patient was advised to call immediately if he has any other concerning symptoms in the interval. The total time spent in the appointment was 30 minutes. Disclaimer: This note was dictated with voice recognition software. Similar sounding words can inadvertently be transcribed and may be missed upon review. Lajuana Matte, MD

## 2024-01-02 ENCOUNTER — Telehealth: Payer: Self-pay | Admitting: Internal Medicine

## 2024-01-02 NOTE — Telephone Encounter (Signed)
Scheduled appointment per referral. Talked with the patients spouse [name] and she is aware of the made appointment for the patient.

## 2024-01-04 ENCOUNTER — Emergency Department (HOSPITAL_COMMUNITY): Payer: Medicare Other

## 2024-01-04 ENCOUNTER — Inpatient Hospital Stay: Payer: Medicare Other

## 2024-01-04 ENCOUNTER — Other Ambulatory Visit: Payer: Self-pay

## 2024-01-04 ENCOUNTER — Encounter: Payer: Self-pay | Admitting: Internal Medicine

## 2024-01-04 ENCOUNTER — Emergency Department (HOSPITAL_COMMUNITY)
Admission: EM | Admit: 2024-01-04 | Discharge: 2024-01-04 | Disposition: A | Discharge status: Home / Self Care | Payer: Medicare Other | Attending: Emergency Medicine | Admitting: Emergency Medicine

## 2024-01-04 ENCOUNTER — Encounter (HOSPITAL_COMMUNITY): Payer: Self-pay | Admitting: Emergency Medicine

## 2024-01-04 ENCOUNTER — Inpatient Hospital Stay (HOSPITAL_BASED_OUTPATIENT_CLINIC_OR_DEPARTMENT_OTHER): Payer: Medicare Other | Admitting: Physician Assistant

## 2024-01-04 VITALS — BP 116/71 | HR 136 | Temp 98.3°F | Resp 19 | Wt 252.8 lb

## 2024-01-04 DIAGNOSIS — Z85118 Personal history of other malignant neoplasm of bronchus and lung: Secondary | ICD-10-CM | POA: Insufficient documentation

## 2024-01-04 DIAGNOSIS — R0981 Nasal congestion: Secondary | ICD-10-CM | POA: Insufficient documentation

## 2024-01-04 DIAGNOSIS — R000 Tachycardia, unspecified: Secondary | ICD-10-CM | POA: Insufficient documentation

## 2024-01-04 DIAGNOSIS — J984 Other disorders of lung: Secondary | ICD-10-CM | POA: Diagnosis not present

## 2024-01-04 DIAGNOSIS — Z20822 Contact with and (suspected) exposure to covid-19: Secondary | ICD-10-CM | POA: Diagnosis not present

## 2024-01-04 DIAGNOSIS — J069 Acute upper respiratory infection, unspecified: Secondary | ICD-10-CM | POA: Diagnosis not present

## 2024-01-04 DIAGNOSIS — R531 Weakness: Secondary | ICD-10-CM | POA: Insufficient documentation

## 2024-01-04 DIAGNOSIS — R062 Wheezing: Secondary | ICD-10-CM | POA: Diagnosis present

## 2024-01-04 DIAGNOSIS — C3411 Malignant neoplasm of upper lobe, right bronchus or lung: Secondary | ICD-10-CM

## 2024-01-04 DIAGNOSIS — J929 Pleural plaque without asbestos: Secondary | ICD-10-CM | POA: Diagnosis not present

## 2024-01-04 DIAGNOSIS — R4182 Altered mental status, unspecified: Secondary | ICD-10-CM | POA: Diagnosis not present

## 2024-01-04 DIAGNOSIS — R5383 Other fatigue: Secondary | ICD-10-CM | POA: Diagnosis not present

## 2024-01-04 DIAGNOSIS — R059 Cough, unspecified: Secondary | ICD-10-CM | POA: Diagnosis present

## 2024-01-04 DIAGNOSIS — R600 Localized edema: Secondary | ICD-10-CM | POA: Diagnosis not present

## 2024-01-04 DIAGNOSIS — R918 Other nonspecific abnormal finding of lung field: Secondary | ICD-10-CM | POA: Diagnosis not present

## 2024-01-04 DIAGNOSIS — Z95828 Presence of other vascular implants and grafts: Secondary | ICD-10-CM

## 2024-01-04 DIAGNOSIS — J90 Pleural effusion, not elsewhere classified: Secondary | ICD-10-CM | POA: Diagnosis not present

## 2024-01-04 LAB — CBC WITH DIFFERENTIAL (CANCER CENTER ONLY)
Abs Immature Granulocytes: 0.06 10*3/uL (ref 0.00–0.07)
Basophils Absolute: 0 10*3/uL (ref 0.0–0.1)
Basophils Relative: 0 %
Eosinophils Absolute: 0 10*3/uL (ref 0.0–0.5)
Eosinophils Relative: 0 %
HCT: 33.2 % — ABNORMAL LOW (ref 39.0–52.0)
Hemoglobin: 10.8 g/dL — ABNORMAL LOW (ref 13.0–17.0)
Immature Granulocytes: 1 %
Lymphocytes Relative: 4 %
Lymphs Abs: 0.4 10*3/uL — ABNORMAL LOW (ref 0.7–4.0)
MCH: 28.9 pg (ref 26.0–34.0)
MCHC: 32.5 g/dL (ref 30.0–36.0)
MCV: 88.8 fL (ref 80.0–100.0)
Monocytes Absolute: 0.7 10*3/uL (ref 0.1–1.0)
Monocytes Relative: 8 %
Neutro Abs: 8.2 10*3/uL — ABNORMAL HIGH (ref 1.7–7.7)
Neutrophils Relative %: 87 %
Platelet Count: 271 10*3/uL (ref 150–400)
RBC: 3.74 MIL/uL — ABNORMAL LOW (ref 4.22–5.81)
RDW: 14.7 % (ref 11.5–15.5)
WBC Count: 9.5 10*3/uL (ref 4.0–10.5)
nRBC: 0 % (ref 0.0–0.2)

## 2024-01-04 LAB — SAMPLE TO BLOOD BANK

## 2024-01-04 LAB — URINALYSIS, W/ REFLEX TO CULTURE (INFECTION SUSPECTED)
Bacteria, UA: NONE SEEN
Bilirubin Urine: NEGATIVE
Glucose, UA: NEGATIVE mg/dL
Hgb urine dipstick: NEGATIVE
Ketones, ur: 20 mg/dL — AB
Leukocytes,Ua: NEGATIVE
Nitrite: NEGATIVE
Protein, ur: 30 mg/dL — AB
Specific Gravity, Urine: 1.019 (ref 1.005–1.030)
pH: 6 (ref 5.0–8.0)

## 2024-01-04 LAB — RESP PANEL BY RT-PCR (RSV, FLU A&B, COVID)  RVPGX2
Influenza A by PCR: NEGATIVE
Influenza B by PCR: NEGATIVE
Resp Syncytial Virus by PCR: NEGATIVE
SARS Coronavirus 2 by RT PCR: NEGATIVE

## 2024-01-04 LAB — CMP (CANCER CENTER ONLY)
ALT: 17 U/L (ref 0–44)
AST: 21 U/L (ref 15–41)
Albumin: 3.6 g/dL (ref 3.5–5.0)
Alkaline Phosphatase: 99 U/L (ref 38–126)
Anion gap: 11 (ref 5–15)
BUN: 13 mg/dL (ref 8–23)
CO2: 25 mmol/L (ref 22–32)
Calcium: 9 mg/dL (ref 8.9–10.3)
Chloride: 99 mmol/L (ref 98–111)
Creatinine: 1.09 mg/dL (ref 0.61–1.24)
GFR, Estimated: >60 mL/min (ref >60)
Glucose, Bld: 126 mg/dL — ABNORMAL HIGH (ref 70–99)
Potassium: 4.1 mmol/L (ref 3.5–5.1)
Sodium: 135 mmol/L (ref 135–145)
Total Bilirubin: 0.7 mg/dL (ref 0.0–1.2)
Total Protein: 6.9 g/dL (ref 6.5–8.1)

## 2024-01-04 MED ORDER — HEPARIN SOD (PORK) LOCK FLUSH 100 UNIT/ML IV SOLN
500.0000 [IU] | Freq: Once | INTRAVENOUS | Status: AC
  Administered 2024-01-04: 500 [IU]
  Filled 2024-01-04: qty 5

## 2024-01-04 MED ORDER — AZITHROMYCIN 250 MG PO TABS: 250.0000 mg | ORAL_TABLET | Freq: Every day | ORAL | 0 refills | Status: DC

## 2024-01-04 MED ORDER — IPRATROPIUM-ALBUTEROL 0.5-2.5 (3) MG/3ML IN SOLN
3.0000 mL | Freq: Once | RESPIRATORY_TRACT | Status: AC
Start: 2024-01-04 — End: 2024-01-04
  Administered 2024-01-04: 3 mL via RESPIRATORY_TRACT
  Filled 2024-01-04: qty 3

## 2024-01-04 MED ORDER — ALBUTEROL SULFATE (2.5 MG/3ML) 0.083% IN NEBU
5.0000 mg | INHALATION_SOLUTION | Freq: Once | RESPIRATORY_TRACT | Status: AC
  Administered 2024-01-04: 5 mg via RESPIRATORY_TRACT
  Filled 2024-01-04: qty 6

## 2024-01-04 MED ORDER — PREDNISONE 10 MG PO TABS: 20.0000 mg | ORAL_TABLET | Freq: Every day | ORAL | 0 refills | Status: AC

## 2024-01-04 MED ORDER — SODIUM CHLORIDE 0.9% FLUSH
10.0000 mL | Freq: Once | INTRAVENOUS | Status: AC
  Administered 2024-01-04: 10 mL

## 2024-01-04 MED ORDER — SODIUM CHLORIDE 0.9 % IV BOLUS
500.0000 mL | Freq: Once | INTRAVENOUS | Status: AC
Start: 2024-01-04 — End: 2024-01-04
  Administered 2024-01-04: 500 mL via INTRAVENOUS

## 2024-01-04 NOTE — ED Provider Notes (Signed)
[place] EMERGENCY DEPARTMENT AT [facility] Provider Note   CSN: [id number] Arrival date & time: 01/04/24  2841     History  Chief Complaint  Patient presents with   Tachycardia   Weakness   Cough   Adam Wise is a 73 y.o. male with a known history of metastatic small cell carcinoma who presents accompanied by wife and daughter with concerns of fatigue, confusion, and a productive cough. Adam Wise was sent to the ED by his oncologist who Adam Wise had an appointment with today for further evaluation. Adam Wise is currently undergoing chemotherapy and radiation therapy. These symptoms have been going on for the past week. Adam Wise also notes some dark urine and pain when peeing, but had a urinalysis done at his PCP's office a few days ago which was normal. Adam Wise says for the past week or so Adam Wise has had a cough with yellowish sputum production, nasal congestion, and tachycardia. Adam Wise has not been sleeping well due to these symptoms. His wife notes some new behavioral changes including wandering around the house at night and repetitive rearrangement of objects. Adam Wise denies any confusion. Adam Wise denies any nausea/vomiting, fevers, rash, focal weakness, vision changes, headaches, shortness of breath.   Home Medications Prior to Admission medications   Medication Sig Start Date End Date Taking? Authorizing Provider  azithromycin (ZITHROMAX) 250 MG tablet Take 1 tablet (250 mg total) by mouth daily. Take first 2 tablets together, then 1 every day until finished. 01/04/24  Yes Annett Fabian, MD  predniSONE (DELTASONE) 10 MG tablet Take 2 tablets (20 mg total) by mouth daily for 5 days. 01/04/24 01/09/24 Yes Annett Fabian, MD  amLODipine (NORVASC) 10 MG tablet TAKE 1 TABLET BY MOUTH EVERY DAY FOR BLOOD PRESSURE 10/25/23   Doreene Nest, NP  B-D 3CC LUER-LOK SYR 22GX1" 22G X 1" 3 ML MISC USE AS INSTRUCTED FOR TESTOSTERONE INJECTION EVERY 2 WEEKS 03/20/21   Doreene Nest, NP  Black Pepper-Turmeric (TURMERIC  COMPLEX/BLACK PEPPER PO) Take 1 tablet by mouth in the morning and at bedtime.    [provider]  Coenzyme Q10 (COQ10) 200 MG CAPS Take 200 mg by mouth at bedtime.    [provider]  cyclobenzaprine (FLEXERIL) 5 MG tablet TAKE 1 TABLET BY MOUTH AT BEDTIME AS NEEDED FOR MUSCLE SPASMS. 07/21/23   Doreene Nest, NP  docusate sodium (COLACE) 100 MG capsule Take 1 capsule (100 mg total) by mouth every 12 (twelve) hours. Patient not taking: Reported on 08/10/2023 08/02/23   Anders Simmonds T, DO  ELIQUIS 5 MG TABS tablet TAKE 1 TABLET BY MOUTH TWICE A DAY 12/06/23   Heilingoetter, Cassandra L, PA-C  esomeprazole (NEXIUM) 20 MG capsule Take 20 mg by mouth in the morning.    [provider]  gabapentin (NEURONTIN) 300 MG capsule TAKE 1 CAPSULE (300 MG TOTAL) BY MOUTH 2 (TWO) TIMES DAILY. FOR BACK PAIN. 10/25/23   Doreene Nest, NP  glipiZIDE (GLUCOTROL) 5 MG tablet Take 0.5 tablets (2.5 mg total) by mouth 2 (two) times daily before a meal. for diabetes. 09/20/23   Doreene Nest, NP  glucose blood (ONETOUCH ULTRA) test strip USE UP TO 4 TIMES DAILY AS DIRECTED 07/29/22   Doreene Nest, NP  Insulin Glargine (BASAGLAR KWIKPEN) 100 UNIT/ML INJECT 24 UNITS INTO THE SKIN AT BEDTIME. FOR DIABETES. Patient taking differently: Inject 0-24 Units into the skin See admin instructions. Inject 24 units into the skin at bedtime and PER SLIDING SCALE in the morning  AS NEEDED FOR AN ELEVATED BGL, possibly caused by other prescribed medications 03/25/23   Doreene Nest, NP  Insulin Pen Needle (BD PEN NEEDLE NANO U/F) 32G X 4 MM MISC Use with insulin as prescribed Dx Code: E11.9 05/20/23   Doreene Nest, NP  ipratropium-albuterol (DUONEB) 0.5-2.5 (3) MG/3ML SOLN Take 3 mLs by nebulization every 4 (four) hours while awake for 3 days, THEN 3 mLs every 4 (four) hours as needed (shortness of breath or wheezing). 10/13/21 01/18/24  Narda Bonds, MD  KRILL OIL PO Take 1 capsule by  mouth daily.    [provider]  Lancets MISC USE UP TO 3 TIMES DAILY AS DIRECTED 05/06/18   Doreene Nest, NP  lidocaine (LIDODERM) 5 % Place 1 patch onto the skin daily. Remove & Discard patch within 12 hours or as directed by MD 05/19/23   Kommor, Wyn Forster, MD  losartan (COZAAR) 50 MG tablet TAKE 1 TABLET (50 MG TOTAL) BY MOUTH DAILY. FOR BLOOD PRESSURE. 08/20/23   Doreene Nest, NP  Melatonin 10 MG CAPS Take 10 mg by mouth at bedtime as needed (for sleep).    [provider]  metFORMIN (GLUCOPHAGE) 1000 MG tablet TAKE 1 TABLET (1,000 MG TOTAL) BY MOUTH 2 (TWO) TIMES DAILY WITH A MEAL. FOR DIABETES 05/20/23   Doreene Nest, NP  morphine (MS CONTIN) 30 MG 12 hr tablet Take 30 mg by mouth 2 (two) times daily. 12/17/23   [provider]  Multiple Vitamin (MULTI-VITAMINS) TABS Take 1 tablet by mouth daily with breakfast.    [provider]  Naloxone HCl 3 MG/0.1ML LIQD Place 1 spray into both nostrils once. For known/suspected opiod overdose. Every 2-3 minutes in alternating nostril till EMS arrives. 10/06/23   [provider]  ondansetron (ZOFRAN) 4 MG tablet Take 1 tablet (4 mg total) by mouth every 8 (eight) hours as needed for nausea or vomiting. 08/18/23   Doreene Nest, NP  oxyCODONE-acetaminophen (PERCOCET) 10-325 MG tablet Take 1 tablet by mouth every 4 (four) hours as needed. 06/29/23   [provider]  pioglitazone (ACTOS) 45 MG tablet TAKE 1 TABLET (45 MG TOTAL) BY MOUTH DAILY. FOR DIABETES. 08/20/23   Doreene Nest, NP  pravastatin (PRAVACHOL) 40 MG tablet TAKE 1 TABLET BY MOUTH EVERY DAY IN THE EVENING FOR CHOLESTEROL 08/21/23   Doreene Nest, NP  PRESCRIPTION MEDICATION CPAP- At bedtime    [provider]  testosterone cypionate (DEPOTESTOSTERONE CYPIONATE) 200 MG/ML injection Inject 200 mg into the muscle every 14 (fourteen) days.    [provider]      Allergies    Bupropion and Hydrochlorothiazide     Review of Systems   Review of Systems  Constitutional:  Positive for appetite change and fatigue.  HENT:  Positive for congestion.   Eyes: Negative.   Respiratory: Negative.    Cardiovascular: Negative.   Gastrointestinal: Negative.   Endocrine: Negative.   Genitourinary:  Positive for dysuria and hematuria.  Musculoskeletal:  Positive for back pain.  Skin: Negative.   Allergic/Immunologic: Negative.   Neurological: Negative.   Hematological: Negative.   Psychiatric/Behavioral:  Positive for sleep disturbance.     Physical Exam Updated Vital Signs BP 116/73 (BP Location: Left Arm)   Pulse (!) 113   Temp 98.5 F (36.9 C) (Oral)   Resp 20   Ht 5\' 9"  (1.753 m)   Wt 114.7 kg   SpO2 94%   BMI 37.33 kg/m  Physical  Exam Constitutional:      Appearance: Normal appearance.  Cardiovascular:     Rate and Rhythm: Regular rhythm. Tachycardia present.  Pulmonary:     Effort: Pulmonary effort is normal.     Breath sounds: Normal breath sounds.  Abdominal:     General: Abdomen is flat. There is no distension.     Palpations: Abdomen is soft.     Comments: Tenderness to deep palpation in the suprapubic region  Musculoskeletal:     Right lower leg: Edema present.     Left lower leg: Edema present.  Neurological:     General: No focal deficit present.     Mental Status: Adam Wise is alert and oriented to person, place, and time.     ED Results / Procedures / Treatments   Labs (all labs ordered are listed, but only abnormal results are displayed) Labs Reviewed  URINALYSIS, W/ REFLEX TO CULTURE (INFECTION SUSPECTED) - Abnormal; Notable for the following components:      Result Value   Ketones, ur 20 (*)    Protein, ur 30 (*)    All other components within normal limits  RESP PANEL BY RT-PCR (RSV, FLU A&B, COVID)  [id number]   EKG None  Radiology DG Chest Port 1 View Result Date: 01/04/2024 CLINICAL DATA:  Wheezing and productive cough. Known small-cell lung cancer. EXAM:  PORTABLE CHEST 1 VIEW COMPARISON:  X-ray 08/10/2023. FINDINGS: Stable right IJ chest port. Enlarged cardiopericardial silhouette. Perihilar opacity seen along the right hemithorax with volume loss and pleural thickening. Appearance is similar to previous. Small right effusion. Stable appearance to the clear left lung. No left-sided pneumothorax or effusion. Overlapping cardiac leads. Film is under penetrated. IMPRESSION: No significant interval change. Opacity along the right perihilar region with volume loss of the right lung. Pleural thickening. Chest port. Small right effusion. Electronically Signed   By: Karen Kays M.D.   On: 01/04/2024 11:49   CT Head Wo Contrast Result Date: 01/04/2024 CLINICAL DATA:  mental status change, known small cell carcinoma EXAM: CT HEAD WITHOUT CONTRAST TECHNIQUE: Contiguous axial images were obtained from the base of the skull through the vertex without intravenous contrast. RADIATION DOSE REDUCTION: This exam was performed according to the departmental dose-optimization program which includes automated exposure control, adjustment of the mA and/or kV according to patient size and/or use of iterative reconstruction technique. COMPARISON:  MR Head 02/06/23 FINDINGS: Brain: No hemorrhage. No hydrocephalus. No extra-axial fluid collection. No CT evidence of an acute cortical infarct. No mass effect. No mass lesion. Vascular: No hyperdense vessel or unexpected calcification. Skull: Normal. Negative for fracture or focal lesion. Sinuses/Orbits: No middle ear or mastoid effusion. Air-fluid level in the right maxillary sinus, which can be seen in the setting of acute sinusitis. Orbits are unremarkable. Other: None. IMPRESSION: 1. No acute intracranial abnormality. 2. Air-fluid level in the right maxillary sinus, which can be seen in the setting of acute sinusitis. Electronically Signed   By: Lorenza Cambridge M.D.   On: 01/04/2024 11:18    Medications Ordered in ED Medications   heparin lock flush 100 unit/mL (has no administration in time range)  sodium chloride 0.9 % bolus 500 mL (0 mLs Intravenous Stopped 01/04/24 1326)  albuterol (PROVENTIL) (2.5 MG/3ML) 0.083% nebulizer solution 5 mg (5 mg Nebulization Given 01/04/24 1017)  ipratropium-albuterol (DUONEB) 0.5-2.5 (3) MG/3ML nebulizer solution 3 mL (3 mLs Nebulization Given 01/04/24 1326)    ED Course/ Medical Decision Making/ A&P    Medical Decision Making Amount and/or  Complexity of Data Reviewed Radiology: ordered.  Risk Prescription drug management.   Lazarus Jaquith is a 73 year old patient with a known history of metastatic small cell carcinoma who presents from his oncologist's office with concerns of worsening confusion, dark urine, and URI symptoms. Given his congestion, cough and diffuse wheezing on exam Adam Wise likely has an upper respiratory infection. Chest x-ray was personally reviewed, similar to his prior CXR with redemonstration of the left-sided perihilar opacity. Respiratory viral panel was negative. We gave him albuterol followed by a duoneb with improvement of symptoms. His tachycardia improved with a fluid bolus, which suggests it may have been elevated in the setting of poor oral intake and dehydration.  Adam Wise was alert and oriented today without significant confusion on exam, but given his new behavior changes in the setting of metastatic cancer, we ordered a CT scan to evaluate for brain metastasis. CT scan personally reviewed, no new masses or intracranial abnormalities, agree with the radiologist. We also collected a urinalysis given his reported dysuria and dark urine, which was unremarkable with no evidence of a UTI.  CBC and BMP were done this morning at the oncologist's office. CBC unremarkable with known normocytic anemia, around baseline. No leukocytosis. CMP with normal kidney labs, electrolytes, liver enzymes.   No infectious, structural, or metabolic causes of his change in behavioral have  been identified in our workup. Recommended Adam Wise speak with his oncologist about whether any of his symptoms could be related to tarlatamab, which Adam Wise recently completed.   His symptoms have improved following breathing treatments and our workup has been largely unrevealing for any etiologies of encephalopathy. We will treat his respiratory infection with azithromycin and prednisone. Adam Wise can follow up with his outpatient oncologist and PCP.   Final Clinical Impression(s) / ED Diagnoses Final diagnoses:  Upper respiratory tract infection, unspecified type   Rx / DC Orders ED Discharge Orders          Ordered    predniSONE (DELTASONE) 10 MG tablet  Daily        01/04/24 1337    azithromycin (ZITHROMAX) 250 MG tablet  Daily        01/04/24 1338              Annett Fabian, MD 01/04/24 1357    Gwyneth Sprout, MD 01/04/24 1531

## 2024-01-04 NOTE — Telephone Encounter (Signed)
Copied from CRM [id number]. Topic: Clinical - Lab/Test Results >> Jan 01, 2024  4:54 PM Jonne Ply wrote: [phone] daughter of patient  Reason for CRM: Daughter of the patient stated they were concerned about his labs

## 2024-01-04 NOTE — Discharge Instructions (Addendum)
You came to the emergency department with shortness of breath, cough, and recent behavioral changes.  A complete workup was done including a complete blood count, complete metabolic panel, EKG, respiratory viral panel, urinalysis, a head CT, and a chest x-ray.   Your labs were overall normal and did not show any electrolyte abnormalities or concerns for systemic infection.   Your imaging was reassuring as well. There were no abnormalities on your head CT and your chest x-ray was unchanged from your prior one.   Your lungs were quite wheezy, but this improved with breathing treatments. These respiratory symptoms are likely related to an upper respiratory infection. We will send a 5 day prescription of antibiotics to your pharmacy. Please take these as prescribed. We have also printed a prescription for prednisone, which you make take if you feel your symptoms are not improving for 5 days.

## 2024-01-04 NOTE — ED Triage Notes (Signed)
Pt. Brought over to ED from cancer center. Cancer center RN stated pt. Was tachy with congestion and cough. No fevers.

## 2024-01-05 ENCOUNTER — Other Ambulatory Visit: Payer: Self-pay

## 2024-01-05 ENCOUNTER — Inpatient Hospital Stay: Payer: Medicare Other

## 2024-01-06 ENCOUNTER — Telehealth: Payer: Self-pay

## 2024-01-06 ENCOUNTER — Ambulatory Visit: Payer: Medicare Other

## 2024-01-06 NOTE — Telephone Encounter (Signed)
Called pt per scheduling request to answer questions about palliative care. RN called, pt asleep but pt wife [name] answered. All questions answered and RN provided with [facility] palliative team number for patient- Adam Wise [phone]. No further needs at this time.

## 2024-01-07 ENCOUNTER — Ambulatory Visit: Payer: Medicare Other

## 2024-01-07 ENCOUNTER — Telehealth: Payer: Self-pay

## 2024-01-07 ENCOUNTER — Other Ambulatory Visit: Payer: Self-pay | Admitting: Primary Care

## 2024-01-07 DIAGNOSIS — C3411 Malignant neoplasm of upper lobe, right bronchus or lung: Secondary | ICD-10-CM | POA: Diagnosis not present

## 2024-01-07 DIAGNOSIS — C7951 Secondary malignant neoplasm of bone: Secondary | ICD-10-CM | POA: Diagnosis not present

## 2024-01-07 DIAGNOSIS — J9611 Chronic respiratory failure with hypoxia: Secondary | ICD-10-CM

## 2024-01-07 DIAGNOSIS — Z87891 Personal history of nicotine dependence: Secondary | ICD-10-CM | POA: Diagnosis not present

## 2024-01-07 DIAGNOSIS — Z510 Encounter for antineoplastic radiation therapy: Secondary | ICD-10-CM | POA: Diagnosis not present

## 2024-01-07 MED ORDER — IPRATROPIUM-ALBUTEROL 0.5-2.5 (3) MG/3ML IN SOLN: RESPIRATORY_TRACT | 0 refills | Status: DC

## 2024-01-07 NOTE — Telephone Encounter (Signed)
Patients wife called in and talked to a scheduler and stated that the patient is still sick and now the wife is sick.  Called patient to check in.  Informed patient we will keep appts for next week, just to call Monday morning if still not well.  Patient verbalized understanding.

## 2024-01-08 ENCOUNTER — Other Ambulatory Visit: Payer: Self-pay | Admitting: Primary Care

## 2024-01-08 ENCOUNTER — Ambulatory Visit: Payer: Medicare Other

## 2024-01-08 DIAGNOSIS — J9611 Chronic respiratory failure with hypoxia: Secondary | ICD-10-CM

## 2024-01-08 MED ORDER — ALBUTEROL SULFATE HFA 108 (90 BASE) MCG/ACT IN AERS: 2.0000 | INHALATION_SPRAY | RESPIRATORY_TRACT | 0 refills | Status: DC | PRN

## 2024-01-11 ENCOUNTER — Ambulatory Visit: Payer: Medicare Other | Admitting: Internal Medicine

## 2024-01-11 ENCOUNTER — Inpatient Hospital Stay: Payer: Medicare Other

## 2024-01-11 ENCOUNTER — Other Ambulatory Visit (HOSPITAL_COMMUNITY): Payer: Self-pay

## 2024-01-11 ENCOUNTER — Telehealth: Payer: Self-pay

## 2024-01-11 ENCOUNTER — Other Ambulatory Visit: Payer: Medicare Other

## 2024-01-11 ENCOUNTER — Inpatient Hospital Stay (HOSPITAL_BASED_OUTPATIENT_CLINIC_OR_DEPARTMENT_OTHER): Payer: Medicare Other | Admitting: Internal Medicine

## 2024-01-11 ENCOUNTER — Encounter: Payer: Self-pay | Admitting: Internal Medicine

## 2024-01-11 VITALS — HR 98

## 2024-01-11 VITALS — BP 102/79 | HR 102 | Temp 97.5°F | Resp 16 | Ht 69.0 in | Wt 246.5 lb

## 2024-01-11 DIAGNOSIS — C3411 Malignant neoplasm of upper lobe, right bronchus or lung: Secondary | ICD-10-CM

## 2024-01-11 DIAGNOSIS — Z510 Encounter for antineoplastic radiation therapy: Secondary | ICD-10-CM | POA: Diagnosis not present

## 2024-01-11 DIAGNOSIS — Z95828 Presence of other vascular implants and grafts: Secondary | ICD-10-CM

## 2024-01-11 DIAGNOSIS — D6481 Anemia due to antineoplastic chemotherapy: Secondary | ICD-10-CM

## 2024-01-11 DIAGNOSIS — C7951 Secondary malignant neoplasm of bone: Secondary | ICD-10-CM | POA: Diagnosis not present

## 2024-01-11 LAB — SAMPLE TO BLOOD BANK

## 2024-01-11 LAB — CMP (CANCER CENTER ONLY)
ALT: 17 U/L (ref 0–44)
AST: 19 U/L (ref 15–41)
Albumin: 3.7 g/dL (ref 3.5–5.0)
Alkaline Phosphatase: 93 U/L (ref 38–126)
Anion gap: 9 (ref 5–15)
BUN: 19 mg/dL (ref 8–23)
CO2: 30 mmol/L (ref 22–32)
Calcium: 9.3 mg/dL (ref 8.9–10.3)
Chloride: 99 mmol/L (ref 98–111)
Creatinine: 1.06 mg/dL (ref 0.61–1.24)
GFR, Estimated: >60 mL/min (ref >60)
Glucose, Bld: 67 mg/dL — ABNORMAL LOW (ref 70–99)
Potassium: 4.2 mmol/L (ref 3.5–5.1)
Sodium: 138 mmol/L (ref 135–145)
Total Bilirubin: 0.4 mg/dL (ref 0.0–1.2)
Total Protein: 6.9 g/dL (ref 6.5–8.1)

## 2024-01-11 LAB — CBC WITH DIFFERENTIAL (CANCER CENTER ONLY)
Abs Immature Granulocytes: 0.08 10*3/uL — ABNORMAL HIGH (ref 0.00–0.07)
Basophils Absolute: 0 10*3/uL (ref 0.0–0.1)
Basophils Relative: 0 %
Eosinophils Absolute: 0 10*3/uL (ref 0.0–0.5)
Eosinophils Relative: 0 %
HCT: 34.3 % — ABNORMAL LOW (ref 39.0–52.0)
Hemoglobin: 10.8 g/dL — ABNORMAL LOW (ref 13.0–17.0)
Immature Granulocytes: 1 %
Lymphocytes Relative: 7 %
Lymphs Abs: 0.6 10*3/uL — ABNORMAL LOW (ref 0.7–4.0)
MCH: 29 pg (ref 26.0–34.0)
MCHC: 31.5 g/dL (ref 30.0–36.0)
MCV: 92.2 fL (ref 80.0–100.0)
Monocytes Absolute: 0.6 10*3/uL (ref 0.1–1.0)
Monocytes Relative: 7 %
Neutro Abs: 7.1 10*3/uL (ref 1.7–7.7)
Neutrophils Relative %: 85 %
Platelet Count: 310 10*3/uL (ref 150–400)
RBC: 3.72 MIL/uL — ABNORMAL LOW (ref 4.22–5.81)
RDW: 14.5 % (ref 11.5–15.5)
WBC Count: 8.4 10*3/uL (ref 4.0–10.5)
nRBC: 0 % (ref 0.0–0.2)

## 2024-01-11 MED ORDER — TOPOTECAN HCL CHEMO INJECTION 4 MG
1.2000 mg/m2 | Freq: Once | INTRAVENOUS | Status: AC
  Administered 2024-01-11: 2.9 mg via INTRAVENOUS
  Filled 2024-01-11: qty 2.9

## 2024-01-11 MED ORDER — SODIUM CHLORIDE 0.9% FLUSH
10.0000 mL | INTRAVENOUS | Status: DC | PRN
  Administered 2024-01-11: 10 mL

## 2024-01-11 MED ORDER — PROCHLORPERAZINE MALEATE 10 MG PO TABS
10.0000 mg | ORAL_TABLET | Freq: Once | ORAL | Status: AC
Start: 2024-01-11 — End: 2024-01-11
  Administered 2024-01-11: 10 mg via ORAL
  Filled 2024-01-11: qty 1

## 2024-01-11 MED ORDER — SODIUM CHLORIDE 0.9 % IV SOLN: INTRAVENOUS | Status: DC

## 2024-01-11 MED ORDER — TRILACICLIB DIHYDROCHLORIDE INJECTION 300 MG
240.0000 mg/m2 | Freq: Once | INTRAVENOUS | Status: AC
  Administered 2024-01-11: 600 mg via INTRAVENOUS
  Filled 2024-01-11: qty 40

## 2024-01-11 MED ORDER — SODIUM CHLORIDE 0.9% FLUSH
10.0000 mL | Freq: Once | INTRAVENOUS | Status: AC
  Administered 2024-01-11: 10 mL

## 2024-01-11 MED ORDER — HEPARIN SOD (PORK) LOCK FLUSH 100 UNIT/ML IV SOLN
500.0000 [IU] | Freq: Once | INTRAVENOUS | Status: AC | PRN
  Administered 2024-01-11: 500 [IU]

## 2024-01-11 NOTE — Telephone Encounter (Signed)
Pharmacy Patient Advocate Encounter   Received notification from  [facility] Portal that prior authorization for Ipratropium-Albuterol 0.5-2.5 (3)MG/3ML solution is required/requested.   Insurance verification completed.   The patient is insured through [facility] Medicare.   Per test claim: PA required; PA submitted to above mentioned insurance via CoverMyMeds Key/confirmation #/EOC [id number] Status is pending

## 2024-01-11 NOTE — Progress Notes (Signed)
[facility] [phone]   [phone]  OFFICE PROGRESS NOTE  Doreene Nest, NP [address] [place] [address]  DIAGNOSIS: Relapsed extensive stage (T4, N3, M1c)  small cell lung cancer diagnosed in April 2021 and presented with large right upper lobe/right hilar mass with ipsilateral and contralateral mediastinal and right supraclavicular lymphadenopathy in addition to multiple liver lesions. He has disease progression after the first line of chemotherapy in December 2021.  PRIOR THERAPY: 1) Palliative radiotherapy to the right upper lobe lung mass under the care of Dr. Mitzi Hansen. 2) Systemic chemotherapy with carboplatin for AUC of 5 on day 1, etoposide 100 mg/M2 on days 1, 2 and 3 in addition to Imfinzi 1500 mg IV every 3 weeks with chemotherapy then every 4 weeks for maintenance if the patient has no evidence for progression.  He will also receive Cosela 240 mg/m2 on the days of the chemotherapy.  Status post 9 cycles.  Starting from cycle #5 the patient will be on maintenance treatment with immunotherapy with Imfinzi 1500 mg IV every 4 weeks. Last dose of chemotherapy was given on November 13, 2020. This treatment was discontinued secondary to disease progression 3) Systemic chemotherapy with carboplatin for AUC of 5 on day 1, etoposide 100 mg/M2 on days 1, 2 and 3, Tecentriq 1200 mg IV every 3 weeks as well as Cosela 250 mg/M2 on the days of the chemotherapy every 3 weeks.  First dose December 18, 2020.  Status post 8 cycles. 4) Zepzelca (lurbinectedin) 3.2 mgm2 IV every 3 weeks. Last dose on 01/23/22. Status post 12 cycles.  5) Palliative systemic chemotherapy with irinotecan 65 mg/m2 on days 1 and 8 IV every 3 weeks.  Status post 3 cycles.  Last dose was given April 01, 2022 discontinued secondary to disease progression. 6) SBRT to the progressive liver lesions under the care of Dr. Mitzi Hansen.  Last fraction January 27, 2023. 7) systemic chemotherapy  with carboplatin for AUC of 5 on day 1, etoposide 100 Mg/M2 on days 1, 2 and 3 with Cosela before the chemotherapy.  First dose expected to start on 05/28/2022.  Status post 15 cycles.  Starting from cycle #5 his carboplatin will be reduced to AUC of 4 and 2 etoposide 80 Mg/M2.  Last dose was giving April 07, 2023 discontinued for suspicious disease progression. 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  First dose January 04, 2024.  INTERVAL HISTORY: Morgon Pamer 73 y.o. male returns to the clinic today for follow-up visit accompanied by his wife.  Discussed the use of AI scribe software for clinical note transcription with the patient, who gave verbal consent to proceed.  History of Present Illness   Dakarai Mcglocklin, a 73 year old patient with a diagnosis of extensive stage small cell lung cancer in December 2021, has undergone multiple treatments including carboplatin, etoposide, imfinizi, Tecentriq, Zepzelca, and tarlatamab. The patient was scheduled to start Topotecan last week, but due to low blood pressure, general malaise, headaches, and dizziness, the treatment was postponed. The patient reported feeling unwell and was admitted to the emergency department.  Over the past seven days, the patient's condition has improved daily with no new complaints since the last consultation. There have been no new instances of chest pain or unusual shortness of breath. However, the patient  reported a significant amount of mucus production, which was loose and easily expectorated. This symptom has since dissipated.  The patient denied any episodes of nausea, vomiting, or diarrhea. He was recently on a course of antibiotics and a five-day course of prednisone, two tablets a day, which was completed on the day of the consultation. The patient is not  currently on immune therapy.       MEDICAL HISTORY: Past Medical History:  Diagnosis Date   Acute on chronic respiratory failure with hypoxia (HCC) 10/12/2021   Chickenpox    Chronic knee pain    Chronic low back pain    COPD exacerbation (HCC) 10/11/2021   COPD with exacerbation (HCC) 10/12/2021   Coronary artery calcification seen on CAT scan 11/2021   Coronary CTA 11/27/2021: Moderate to large right pleural effusion and compressive atelectasis right lung base. => Coronary Calcium Score 657.  Diffuse RCA calcification.  Minimal mild disease in the LAD and diagonal branches. == Overall limited study.  Notable artifact.   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 He has disease progression after the first line of chemotherapy in December 2021.   Testosterone deficiency    Type 2 diabetes mellitus (HCC)     ALLERGIES:  is allergic to bupropion and hydrochlorothiazide.  MEDICATIONS:  Current Outpatient Medications  Medication Sig Dispense Refill   albuterol (VENTOLIN HFA) 108 (90 Base) MCG/ACT inhaler Inhale 2 puffs into the lungs every 4 (four) hours as needed for shortness of breath. 1 each 0   amLODipine (NORVASC) 10 MG tablet TAKE 1 TABLET BY MOUTH EVERY DAY FOR BLOOD PRESSURE 90 tablet 2   azithromycin (ZITHROMAX) 250 MG tablet Take 1 tablet (250 mg total) by mouth daily. Take first 2 tablets together, then 1 every day until finished. 6 tablet 0   B-D 3CC LUER-LOK SYR 22GX1" 22G X 1" 3 ML MISC USE AS INSTRUCTED FOR TESTOSTERONE INJECTION EVERY 2 WEEKS 10 each 2   Black  Pepper-Turmeric (TURMERIC COMPLEX/BLACK PEPPER PO) Take 1 tablet by mouth in the morning and at bedtime.     Coenzyme Q10 (COQ10) 200 MG CAPS Take 200 mg by mouth at bedtime.     cyclobenzaprine (FLEXERIL) 5 MG tablet TAKE 1 TABLET BY MOUTH AT BEDTIME AS NEEDED FOR MUSCLE SPASMS. 90 tablet 0   docusate sodium (COLACE) 100 MG capsule Take 1 capsule (100 mg total) by mouth every 12 (twelve) hours. (Patient not taking: Reported on 08/10/2023) 60 capsule 0   ELIQUIS 5 MG TABS tablet TAKE 1 TABLET BY MOUTH TWICE A DAY 180 tablet 2   esomeprazole (NEXIUM) 20 MG capsule Take 20 mg by mouth in the morning.     gabapentin (NEURONTIN) 300 MG capsule TAKE 1 CAPSULE (300 MG TOTAL) BY MOUTH 2 (TWO) TIMES DAILY. FOR BACK PAIN. 180 capsule 1   glipiZIDE (GLUCOTROL) 5 MG tablet Take 0.5 tablets (2.5 mg total) by mouth 2 (two) times daily before a meal. for diabetes. 90 tablet 1   glucose blood (ONETOUCH ULTRA) test strip USE UP TO 4 TIMES DAILY AS DIRECTED 400 strip 5   Insulin  Glargine (BASAGLAR KWIKPEN) 100 UNIT/ML INJECT 24 UNITS INTO THE SKIN AT BEDTIME. FOR DIABETES. (Patient taking differently: Inject 0-24 Units into the skin See admin instructions. Inject 24 units into the skin at bedtime and PER SLIDING SCALE in the morning AS NEEDED FOR AN ELEVATED BGL, possibly caused by other prescribed medications) 30 mL 1   Insulin Pen Needle (BD PEN NEEDLE NANO U/F) 32G X 4 MM MISC Use with insulin as prescribed Dx Code: E11.9 100 each 3   ipratropium-albuterol (DUONEB) 0.5-2.5 (3) MG/3ML SOLN Take 3 mLs by nebulization every 4 (four) hours while awake for 3 days, THEN 3 mLs every 4 (four) hours as needed (shortness of breath or wheezing). 360 mL 0   KRILL OIL PO Take 1 capsule by mouth daily.     Lancets MISC USE UP TO 3 TIMES DAILY AS DIRECTED 100 each 2   lidocaine (LIDODERM) 5 % Place 1 patch onto the skin daily. Remove & Discard patch within 12 hours or as directed by MD 30 patch 0   losartan (COZAAR) 50 MG tablet  TAKE 1 TABLET (50 MG TOTAL) BY MOUTH DAILY. FOR BLOOD PRESSURE. 90 tablet 3   Melatonin 10 MG CAPS Take 10 mg by mouth at bedtime as needed (for sleep).     metFORMIN (GLUCOPHAGE) 1000 MG tablet TAKE 1 TABLET (1,000 MG TOTAL) BY MOUTH 2 (TWO) TIMES DAILY WITH A MEAL. FOR DIABETES 180 tablet 0   morphine (MS CONTIN) 30 MG 12 hr tablet Take 30 mg by mouth 2 (two) times daily.     Multiple Vitamin (MULTI-VITAMINS) TABS Take 1 tablet by mouth daily with breakfast.     Naloxone HCl 3 MG/0.1ML LIQD Place 1 spray into both nostrils once. For known/suspected opiod overdose. Every 2-3 minutes in alternating nostril till EMS arrives.     ondansetron (ZOFRAN) 4 MG tablet Take 1 tablet (4 mg total) by mouth every 8 (eight) hours as needed for nausea or vomiting. 30 tablet 0   oxyCODONE-acetaminophen (PERCOCET) 10-325 MG tablet Take 1 tablet by mouth every 4 (four) hours as needed.     pioglitazone (ACTOS) 45 MG tablet TAKE 1 TABLET (45 MG TOTAL) BY MOUTH DAILY. FOR DIABETES. 90 tablet 1   pravastatin (PRAVACHOL) 40 MG tablet TAKE 1 TABLET BY MOUTH EVERY DAY IN THE EVENING FOR CHOLESTEROL 90 tablet 3   PRESCRIPTION MEDICATION CPAP- At bedtime     testosterone cypionate (DEPOTESTOSTERONE CYPIONATE) 200 MG/ML injection Inject 200 mg into the muscle every 14 (fourteen) days.     No current facility-administered medications for this visit.    SURGICAL HISTORY:  Past Surgical History:  Procedure Laterality Date   CHEST TUBE INSERTION Right 01/20/2022   Procedure: CHEST TUBE INSERTION;  Surgeon: Lorin Glass, MD;  Location: [facility] ENDOSCOPY;  Service: Pulmonary;  Laterality: Right;  w/ Talc Pleurodesis, planned admit for Obs afterwards   CHEST TUBE INSERTION Right 04/28/2022   Procedure: INSERTION PLEURAL DRAINAGE CATHETER - Pigtail tail, drainage;  Surgeon: Lorin Glass, MD;  Location: [facility] ENDOSCOPY;  Service: Pulmonary;  Laterality: Right;  talc pleurodesis   CHEST TUBE INSERTION Right 09/01/2022   Procedure:  INSERTION PLEURAL DRAINAGE CATHETER;  Surgeon: Lupita Leash, MD;  Location: [facility] ENDOSCOPY;  Service: Cardiopulmonary;  Laterality: Right;  pigtail catheter placement with talc pleurodesis   COLONOSCOPY WITH PROPOFOL N/A 12/17/2018   Procedure: COLONOSCOPY WITH PROPOFOL;  Surgeon: Pasty Spillers, MD;  Location: [facility] ENDOSCOPY;  Service: Gastroenterology;  Laterality: N/A;  IR IMAGING GUIDED PORT INSERTION  04/17/2020   IR THORACENTESIS ASP PLEURAL SPACE W/IMG GUIDE  11/29/2021   IR THORACENTESIS ASP PLEURAL SPACE W/IMG GUIDE  01/03/2022   JOINT REPLACEMENT Bilateral    REPLACEMENT TOTAL KNEE BILATERAL  2015   TALC PLEURODESIS  09/01/2022   Procedure: Lurlean Nanny;  Surgeon: Lupita Leash, MD;  Location: [facility] ENDOSCOPY;  Service: Cardiopulmonary;;   TONSILLECTOMY  1960   TRANSTHORACIC ECHOCARDIOGRAM  05/2020   a) 05/2020: EF 55 to 60%.  No R WMA.  Mild LVH.  Indeterminate LVEDP.  Unable to assess RVP.  Normal aortic and mitral valves.  Mildly elevated RAP.; b) 09/2021: EF 50-55%. No RWMA. Mild LVH. ~ LVEDP. Mild LA Dilation. NORMAL RV/RAP.  Normal MV/AoV.    REVIEW OF SYSTEMS:  A comprehensive review of systems was negative except for: Constitutional: positive for fatigue Respiratory: positive for dyspnea on exertion Musculoskeletal: positive for back pain   PHYSICAL EXAMINATION: General appearance: alert, cooperative, fatigued, and no distress Head: Normocephalic, without obvious abnormality, atraumatic Neck: no adenopathy, no JVD, supple, symmetrical, trachea midline, and thyroid not enlarged, symmetric, no tenderness/mass/nodules Lymph nodes: Cervical, supraclavicular, and axillary nodes normal. Resp: clear to auscultation bilaterally Back: symmetric, no curvature. ROM normal. No CVA tenderness. Cardio: regular rate and rhythm, S1, S2 normal, no murmur, click, rub or gallop GI: soft, non-tender; bowel sounds normal; no masses,  no organomegaly Extremities: extremities  normal, atraumatic, no cyanosis or edema  ECOG PERFORMANCE STATUS: 1 - Symptomatic but completely ambulatory  Blood pressure 102/79, pulse (!) 102, temperature (!) 97.5 F (36.4 C), temperature source Temporal, resp. rate 16, height 5\' 9"  (1.753 m), weight 246 lb 8 oz (111.8 kg), SpO2 98%.   LABORATORY DATA: Lab Results  Component Value Date   WBC 9.5 01/04/2024   HGB 10.8 (L) 01/04/2024   HCT 33.2 (L) 01/04/2024   MCV 88.8 01/04/2024   PLT 271 01/04/2024      Chemistry      Component Value Date/Time   NA 135 01/04/2024 0747   K 4.1 01/04/2024 0747   CL 99 01/04/2024 0747   CO2 25 01/04/2024 0747   BUN 13 01/04/2024 0747   CREATININE 1.09 01/04/2024 0747   CREATININE 1.45 (H) 10/18/2021 1533      Component Value Date/Time   CALCIUM 9.0 01/04/2024 0747   ALKPHOS 99 01/04/2024 0747   AST 21 01/04/2024 0747   ALT 17 01/04/2024 0747   BILITOT 0.7 01/04/2024 0747       RADIOGRAPHIC STUDIES:    ASSESSMENT AND PLAN: This is a very pleasant 73 year old Caucasian male diagnosed with extensive stage (T4, N3, M1C) small cell lung cancer presented with large right upper lobe lung mass in addition to mediastinal and right supraclavicular lymphadenopathy and multiple metastatic liver lesions diagnosed in April 2021. The patient initially underwent systemic chemotherapy with carboplatin for an AUC of 5 on day 1, etoposide 100 mg per metered squared on days 1, 2, and 3 in addition to [facility] for myeloprotection.  He also received immunotherapy with Imfinzi on day 1 of every chemotherapy cycle.  He is status post 9 cycles.  Starting from cycle #5 he was on single agent immunotherapy with Imfinzi IV every 4 weeks.  The patient had evidence of disease progression on his scan from December 2021.  He was then started on systemic chemotherapy with carboplatin for an AUC of 5 on day 1, etoposide 100 mg per metered squared on days 1, 2, and  3 in addition to Tecentriq 1200 mg on day 1, the patient  is status post 9 cycles.  Starting from cycle #5, the patient started maintenance immunotherapy with Tecentriq.  This was discontinued due to evidence of disease progression. The patient started third line treatment with Zepzelca (lurbinectedin) 3.2 Mg/M2 every 3-week status post 12 cycles of this treatment and he has been tolerating it fairly well except for fatigue.  This treatment was discontinued secondary to disease progression. He underwent fourth line treatment with systemic chemotherapy with irinotecan 65 Mg/M2 on days 1 and 8 every 3 weeks.  Status post 3 cycles.  He tolerated this treatment well with no concerning adverse effect except for mild fatigue. Unfortunately his scan showed evidence for disease progression especially in the liver. I recommended for the patient to discontinue his current treatment with irinotecan at this point. He started systemic chemotherapy again with carboplatin for AUC of 5 on day 1, etoposide 100 Mg/M2 on days 1, 2 and 3 with Cosela status post 15 cycles. I did reduce the dose of carboplatin to AUC of 4 and etoposide 80 Mg/M2 starting from cycle #5.  He has been tolerating this treatment fairly well with no concerning complaints except for the increasing fatigue and weakness as well as the chemotherapy-induced pancytopenia. He had repeat CT scan of the chest, abdomen and pelvis performed recently.  I personally and independently reviewed the scan images and discussed the result and showed the images to the patient and his wife.  His scan showed redemonstration of the posttreatment/postradiation appearance of the right chest with dense fibrosis and consolidation of the perihilar right lung as well as a small loculated appearing right pleural effusion with associated pleural densities suggesting prior talc pleurodesis.  There was significantly increased areas of masslike consolidation in the right lung for example in the peripheral right upper lobe and superior segment of  the right lower lobe with extensive new associated nodularity concerning for local malignant recurrence. The patient is also more symptomatic with more dry cough and shortness of breath. I recommended for him to hold any additional treatment with chemotherapy for now. The patient was seen by Dr. Janann August at [facility] and treated with tarlatamab which is a by specific T-cell engager (BiTE) for the last 8 months.  Unfortunately the repeat CT scan of the chest, abdomen and pelvis showed interval mild to moderate increase in the size of the segment 7 liver lesion in addition to interval mild increase in the size of portacaval and common hepatic artery lymph nodes with stable size retroperitoneal lymph nodes and stable size right adrenal nodule.  There was no evidence for disease progression in the chest.  There was focal enhancement in the right iliac bone concerning for metastatic disease.    Extensive Stage Small Cell Lung Cancer Diagnosed in December 2021. Previously treated with carboplatin, etoposide, Imfinzi, Tecentriq, Zepzelca, and tarlatan. Currently planned for Topotecan treatment weekly for five days every three weeks. Treatment was delayed last week due to hypotension, headache, and dizziness. Reports improvement over the last seven days with no new chest pain, dyspnea, or gastrointestinal symptoms. Blood count today is adequate for treatment initiation. Cosela will be administered before treatment to help maintain blood counts. Discussed risks including fatigue, weakness, nausea, and vomiting. Informed consent obtained. - Initiate Topotecan treatment today - Administer Cosela before treatment - Monitor for side effects including fatigue, weakness, nausea, and vomiting - Schedule follow-up in three weeks - Advise to call if experiencing fever,  chills, or other concerns  Radiation Therapy Radiation therapy is scheduled to start tomorrow. Cleared for radiation therapy based on current health  status. - Proceed with radiation therapy as scheduled  Recent Infection Recent infection treated with antibiotics and five days of prednisone (two tablets a day). Last dose of prednisone taken this morning. No current issues as not on immune therapy. - No further action required for recent infection.   The patient was advised to call immediately if he has any other concerning symptoms in the interval. The patient voices understanding of current disease status and treatment options and is in agreement with the current care plan.  All questions were answered. The patient knows to call the clinic with any problems, questions or concerns. We can certainly see the patient much sooner if necessary.  The total time spent in the appointment was 20 minutes.  Disclaimer: This note was dictated with voice recognition software. Similar sounding words can inadvertently be transcribed and may not be corrected upon review. Lajuana Matte, MD 01/11/24

## 2024-01-11 NOTE — Patient Instructions (Signed)
[facility] - A DEPT OF [facility]  Discharge Instructions: Thank you for choosing [facility] to provide your oncology and hematology care.   If you have a lab appointment with the Cancer Center, please go directly to the Cancer Center and check in at the registration area.   Wear comfortable clothing and clothing appropriate for easy access to any Portacath or PICC line.   We strive to give you quality time with your provider. You may need to reschedule your appointment if you arrive late (15 or more minutes).  Arriving late affects you and other patients whose appointments are after yours.  Also, if you miss three or more appointments without notifying the office, you may be dismissed from the clinic at the provider's discretion.      For prescription refill requests, have your pharmacy contact our office and allow 72 hours for refills to be completed.    Today you received the following chemotherapy and/or immunotherapy agents topotecan      To help prevent nausea and vomiting after your treatment, we encourage you to take your nausea medication as directed.  BELOW ARE SYMPTOMS THAT SHOULD BE REPORTED IMMEDIATELY: *FEVER GREATER THAN 100.4 F (38 C) OR HIGHER *CHILLS OR SWEATING *NAUSEA AND VOMITING THAT IS NOT CONTROLLED WITH YOUR NAUSEA MEDICATION *UNUSUAL SHORTNESS OF BREATH *UNUSUAL BRUISING OR BLEEDING *URINARY PROBLEMS (pain or burning when urinating, or frequent urination) *BOWEL PROBLEMS (unusual diarrhea, constipation, pain near the anus) TENDERNESS IN MOUTH AND THROAT WITH OR WITHOUT PRESENCE OF ULCERS (sore throat, sores in mouth, or a toothache) UNUSUAL RASH, SWELLING OR PAIN  UNUSUAL VAGINAL DISCHARGE OR ITCHING   Items with * indicate a potential emergency and should be followed up as soon as possible or go to the Emergency Department if any problems should occur.  Please show the CHEMOTHERAPY ALERT CARD or IMMUNOTHERAPY  ALERT CARD at check-in to the Emergency Department and triage nurse.  Should you have questions after your visit or need to cancel or reschedule your appointment, please contact [facility] - A DEPT OF [facility]  Dept: [phone]  and follow the prompts.  Office hours are 8:00 a.m. to 4:30 p.m. Monday - Friday. Please note that voicemails left after 4:00 p.m. may not be returned until the following business day.  We are closed weekends and major holidays. You have access to a nurse at all times for urgent questions. Please call the main number to the clinic Dept: [phone] and follow the prompts.   For any non-urgent questions, you may also contact your provider using MyChart. We now offer e-Visits for anyone 13 and older to request care online for non-urgent symptoms. For details visit [email or web address].   Also download the MyChart app! Go to the app store, search "MyChart", open the app, select [place], and log in with your MyChart username and password.

## 2024-01-11 NOTE — Telephone Encounter (Signed)
Pharmacy Patient Advocate Encounter  Received notification from [employer] that Prior Authorization for Ipratropium-albuterol solution has been APPROVED from 12/16/2023 to 01/10/2025. Ran test claim, Copay is $20. This test claim was processed through [facility]- copay amounts may vary at other pharmacies due to pharmacy/plan contracts, or as the patient moves through the different stages of their insurance plan.   PA #/Case ID/Reference #: Not provided

## 2024-01-12 ENCOUNTER — Other Ambulatory Visit: Payer: Self-pay

## 2024-01-12 ENCOUNTER — Inpatient Hospital Stay: Payer: Medicare Other

## 2024-01-12 ENCOUNTER — Ambulatory Visit
Admission: RE | Admit: 2024-01-12 | Discharge: 2024-01-12 | Discharge status: Home / Self Care | Payer: Medicare Other | Source: Ambulatory Visit | Attending: Radiation Oncology | Admitting: Radiation Oncology

## 2024-01-12 VITALS — BP 110/72 | HR 114 | Temp 98.6°F | Resp 18

## 2024-01-12 DIAGNOSIS — Z510 Encounter for antineoplastic radiation therapy: Secondary | ICD-10-CM | POA: Diagnosis not present

## 2024-01-12 DIAGNOSIS — Z87891 Personal history of nicotine dependence: Secondary | ICD-10-CM | POA: Diagnosis not present

## 2024-01-12 DIAGNOSIS — C3411 Malignant neoplasm of upper lobe, right bronchus or lung: Secondary | ICD-10-CM

## 2024-01-12 DIAGNOSIS — C7951 Secondary malignant neoplasm of bone: Secondary | ICD-10-CM | POA: Diagnosis not present

## 2024-01-12 LAB — RAD ONC ARIA SESSION SUMMARY
Course Elapsed Days: 0
Plan Fractions Treated to Date: 1
Plan Prescribed Dose Per Fraction: 2.5 Gy
Plan Total Fractions Prescribed: 15
Plan Total Prescribed Dose: 37.5 Gy
Reference Point Dosage Given to Date: 2.5 Gy
Reference Point Session Dosage Given: 2.5 Gy
Session Number: 1

## 2024-01-12 MED ORDER — SODIUM CHLORIDE 0.9 % IV SOLN: INTRAVENOUS | Status: DC

## 2024-01-12 MED ORDER — PROCHLORPERAZINE MALEATE 10 MG PO TABS
10.0000 mg | ORAL_TABLET | Freq: Once | ORAL | Status: AC
  Administered 2024-01-12: 10 mg via ORAL
  Filled 2024-01-12: qty 1

## 2024-01-12 MED ORDER — SODIUM CHLORIDE 0.9% FLUSH
10.0000 mL | INTRAVENOUS | Status: DC | PRN
  Administered 2024-01-12: 10 mL

## 2024-01-12 MED ORDER — TRILACICLIB DIHYDROCHLORIDE INJECTION 300 MG
240.0000 mg/m2 | Freq: Once | INTRAVENOUS | Status: AC
Start: 2024-01-12 — End: 2024-01-12
  Administered 2024-01-12: 600 mg via INTRAVENOUS
  Filled 2024-01-12: qty 40

## 2024-01-12 MED ORDER — TOPOTECAN HCL CHEMO INJECTION 4 MG
1.2000 mg/m2 | Freq: Once | INTRAVENOUS | Status: AC
  Administered 2024-01-12: 2.9 mg via INTRAVENOUS
  Filled 2024-01-12: qty 2.9

## 2024-01-12 MED ORDER — HEPARIN SOD (PORK) LOCK FLUSH 100 UNIT/ML IV SOLN
500.0000 [IU] | Freq: Once | INTRAVENOUS | Status: AC | PRN
  Administered 2024-01-12: 500 [IU]

## 2024-01-12 NOTE — Progress Notes (Signed)
Dr. Arbutus Ped notified pulse 114. OK to treat w/ day 2 topetecan with elevated HR

## 2024-01-12 NOTE — Patient Instructions (Signed)
[facility] - A DEPT OF [facility]  Discharge Instructions: Thank you for choosing [facility] to provide your oncology and hematology care.   If you have a lab appointment with the Cancer Center, please go directly to the Cancer Center and check in at the registration area.   Wear comfortable clothing and clothing appropriate for easy access to any Portacath or PICC line.   We strive to give you quality time with your provider. You may need to reschedule your appointment if you arrive late (15 or more minutes).  Arriving late affects you and other patients whose appointments are after yours.  Also, if you miss three or more appointments without notifying the office, you may be dismissed from the clinic at the provider's discretion.      For prescription refill requests, have your pharmacy contact our office and allow 72 hours for refills to be completed.    Today you received the following chemotherapy and/or immunotherapy agents topotecan      To help prevent nausea and vomiting after your treatment, we encourage you to take your nausea medication as directed.  BELOW ARE SYMPTOMS THAT SHOULD BE REPORTED IMMEDIATELY: *FEVER GREATER THAN 100.4 F (38 C) OR HIGHER *CHILLS OR SWEATING *NAUSEA AND VOMITING THAT IS NOT CONTROLLED WITH YOUR NAUSEA MEDICATION *UNUSUAL SHORTNESS OF BREATH *UNUSUAL BRUISING OR BLEEDING *URINARY PROBLEMS (pain or burning when urinating, or frequent urination) *BOWEL PROBLEMS (unusual diarrhea, constipation, pain near the anus) TENDERNESS IN MOUTH AND THROAT WITH OR WITHOUT PRESENCE OF ULCERS (sore throat, sores in mouth, or a toothache) UNUSUAL RASH, SWELLING OR PAIN  UNUSUAL VAGINAL DISCHARGE OR ITCHING   Items with * indicate a potential emergency and should be followed up as soon as possible or go to the Emergency Department if any problems should occur.  Please show the CHEMOTHERAPY ALERT CARD or IMMUNOTHERAPY  ALERT CARD at check-in to the Emergency Department and triage nurse.  Should you have questions after your visit or need to cancel or reschedule your appointment, please contact [facility] - A DEPT OF [facility]  Dept: [phone]  and follow the prompts.  Office hours are 8:00 a.m. to 4:30 p.m. Monday - Friday. Please note that voicemails left after 4:00 p.m. may not be returned until the following business day.  We are closed weekends and major holidays. You have access to a nurse at all times for urgent questions. Please call the main number to the clinic Dept: [phone] and follow the prompts.   For any non-urgent questions, you may also contact your provider using MyChart. We now offer e-Visits for anyone 13 and older to request care online for non-urgent symptoms. For details visit [email or web address].   Also download the MyChart app! Go to the app store, search "MyChart", open the app, select [place], and log in with your MyChart username and password.

## 2024-01-13 ENCOUNTER — Inpatient Hospital Stay: Payer: Medicare Other

## 2024-01-13 ENCOUNTER — Other Ambulatory Visit: Payer: Self-pay

## 2024-01-13 ENCOUNTER — Ambulatory Visit
Admission: RE | Admit: 2024-01-13 | Discharge: 2024-01-13 | Disposition: A | Discharge status: Home / Self Care | Payer: Medicare Other | Source: Ambulatory Visit | Attending: Radiation Oncology | Admitting: Radiation Oncology

## 2024-01-13 VITALS — BP 94/69 | HR 100 | Temp 98.4°F | Resp 20

## 2024-01-13 DIAGNOSIS — C3411 Malignant neoplasm of upper lobe, right bronchus or lung: Secondary | ICD-10-CM | POA: Diagnosis not present

## 2024-01-13 DIAGNOSIS — Z510 Encounter for antineoplastic radiation therapy: Secondary | ICD-10-CM | POA: Diagnosis not present

## 2024-01-13 DIAGNOSIS — C7951 Secondary malignant neoplasm of bone: Secondary | ICD-10-CM | POA: Diagnosis not present

## 2024-01-13 LAB — RAD ONC ARIA SESSION SUMMARY
Course Elapsed Days: 1
Plan Fractions Treated to Date: 2
Plan Prescribed Dose Per Fraction: 2.5 Gy
Plan Total Fractions Prescribed: 15
Plan Total Prescribed Dose: 37.5 Gy
Reference Point Dosage Given to Date: 5 Gy
Reference Point Session Dosage Given: 2.5 Gy
Session Number: 2

## 2024-01-13 MED ORDER — SODIUM CHLORIDE 0.9 % IV SOLN: INTRAVENOUS | Status: DC

## 2024-01-13 MED ORDER — TOPOTECAN HCL CHEMO INJECTION 4 MG
1.2000 mg/m2 | Freq: Once | INTRAVENOUS | Status: AC
  Administered 2024-01-13: 2.9 mg via INTRAVENOUS
  Filled 2024-01-13: qty 2.9

## 2024-01-13 MED ORDER — HEPARIN SOD (PORK) LOCK FLUSH 100 UNIT/ML IV SOLN
500.0000 [IU] | Freq: Once | INTRAVENOUS | Status: AC | PRN
  Administered 2024-01-13: 500 [IU]

## 2024-01-13 MED ORDER — SODIUM CHLORIDE 0.9% FLUSH
10.0000 mL | INTRAVENOUS | Status: DC | PRN
  Administered 2024-01-13: 10 mL

## 2024-01-13 MED ORDER — TRILACICLIB DIHYDROCHLORIDE INJECTION 300 MG
240.0000 mg/m2 | Freq: Once | INTRAVENOUS | Status: AC
  Administered 2024-01-13: 600 mg via INTRAVENOUS
  Filled 2024-01-13: qty 40

## 2024-01-13 MED ORDER — PROCHLORPERAZINE MALEATE 10 MG PO TABS
10.0000 mg | ORAL_TABLET | Freq: Once | ORAL | Status: AC
  Administered 2024-01-13: 10 mg via ORAL
  Filled 2024-01-13: qty 1

## 2024-01-13 NOTE — Patient Instructions (Signed)
[facility] - A DEPT OF [facility]  Discharge Instructions: Thank you for choosing [facility] to provide your oncology and hematology care.   If you have a lab appointment with the Cancer Center, please go directly to the Cancer Center and check in at the registration area.   Wear comfortable clothing and clothing appropriate for easy access to any Portacath or PICC line.   We strive to give you quality time with your provider. You may need to reschedule your appointment if you arrive late (15 or more minutes).  Arriving late affects you and other patients whose appointments are after yours.  Also, if you miss three or more appointments without notifying the office, you may be dismissed from the clinic at the provider's discretion.      For prescription refill requests, have your pharmacy contact our office and allow 72 hours for refills to be completed.    Today you received the following chemotherapy and/or immunotherapy agents Topotecan, Cosela   To help prevent nausea and vomiting after your treatment, we encourage you to take your nausea medication as directed.  BELOW ARE SYMPTOMS THAT SHOULD BE REPORTED IMMEDIATELY: *FEVER GREATER THAN 100.4 F (38 C) OR HIGHER *CHILLS OR SWEATING *NAUSEA AND VOMITING THAT IS NOT CONTROLLED WITH YOUR NAUSEA MEDICATION *UNUSUAL SHORTNESS OF BREATH *UNUSUAL BRUISING OR BLEEDING *URINARY PROBLEMS (pain or burning when urinating, or frequent urination) *BOWEL PROBLEMS (unusual diarrhea, constipation, pain near the anus) TENDERNESS IN MOUTH AND THROAT WITH OR WITHOUT PRESENCE OF ULCERS (sore throat, sores in mouth, or a toothache) UNUSUAL RASH, SWELLING OR PAIN  UNUSUAL VAGINAL DISCHARGE OR ITCHING   Items with * indicate a potential emergency and should be followed up as soon as possible or go to the Emergency Department if any problems should occur.  Please show the CHEMOTHERAPY ALERT CARD or  IMMUNOTHERAPY ALERT CARD at check-in to the Emergency Department and triage nurse.  Should you have questions after your visit or need to cancel or reschedule your appointment, please contact [facility]  Dept: [phone]  and follow the prompts.  Office hours are 8:00 a.m. to 4:30 p.m. Monday - Friday. Please note that voicemails left after 4:00 p.m. may not be returned until the following business day.  We are closed weekends and major holidays. You have access to a nurse at all times for urgent questions. Please call the main number to the clinic Dept: [phone] and follow the prompts.   For any non-urgent questions, you may also contact your provider using MyChart. We now offer e-Visits for anyone 61 and older to request care online for non-urgent symptoms. For details visit [email or web address].   Also download the MyChart app! Go to the app store, search "MyChart", open the app, select [facility], and log in with your MyChart username and password.

## 2024-01-14 ENCOUNTER — Other Ambulatory Visit: Payer: Self-pay

## 2024-01-14 ENCOUNTER — Ambulatory Visit
Admission: RE | Admit: 2024-01-14 | Discharge: 2024-01-14 | Disposition: A | Discharge status: Home / Self Care | Payer: Medicare Other | Source: Ambulatory Visit | Attending: Radiation Oncology | Admitting: Radiation Oncology

## 2024-01-14 ENCOUNTER — Inpatient Hospital Stay: Payer: Medicare Other

## 2024-01-14 ENCOUNTER — Telehealth: Payer: Self-pay | Admitting: *Deleted

## 2024-01-14 VITALS — BP 97/63 | HR 102 | Temp 98.3°F | Resp 18 | Ht 69.0 in | Wt 241.8 lb

## 2024-01-14 DIAGNOSIS — C3411 Malignant neoplasm of upper lobe, right bronchus or lung: Secondary | ICD-10-CM

## 2024-01-14 DIAGNOSIS — C7951 Secondary malignant neoplasm of bone: Secondary | ICD-10-CM | POA: Diagnosis not present

## 2024-01-14 DIAGNOSIS — Z510 Encounter for antineoplastic radiation therapy: Secondary | ICD-10-CM | POA: Diagnosis not present

## 2024-01-14 LAB — RAD ONC ARIA SESSION SUMMARY
Course Elapsed Days: 2
Plan Fractions Treated to Date: 3
Plan Prescribed Dose Per Fraction: 2.5 Gy
Plan Total Fractions Prescribed: 15
Plan Total Prescribed Dose: 37.5 Gy
Reference Point Dosage Given to Date: 7.5 Gy
Reference Point Session Dosage Given: 2.5 Gy
Session Number: 3

## 2024-01-14 MED ORDER — SODIUM CHLORIDE 0.9 % IV SOLN: INTRAVENOUS | Status: DC

## 2024-01-14 MED ORDER — HEPARIN SOD (PORK) LOCK FLUSH 100 UNIT/ML IV SOLN
500.0000 [IU] | Freq: Once | INTRAVENOUS | Status: AC | PRN
  Administered 2024-01-14: 500 [IU]

## 2024-01-14 MED ORDER — PROCHLORPERAZINE MALEATE 10 MG PO TABS
10.0000 mg | ORAL_TABLET | Freq: Once | ORAL | Status: AC
Start: 2024-01-14 — End: 2024-01-14
  Administered 2024-01-14: 10 mg via ORAL
  Filled 2024-01-14: qty 1

## 2024-01-14 MED ORDER — SODIUM CHLORIDE 0.9% FLUSH
10.0000 mL | INTRAVENOUS | Status: DC | PRN
  Administered 2024-01-14: 10 mL

## 2024-01-14 MED ORDER — TOPOTECAN HCL CHEMO INJECTION 4 MG
1.2000 mg/m2 | Freq: Once | INTRAVENOUS | Status: AC
  Administered 2024-01-14: 2.9 mg via INTRAVENOUS
  Filled 2024-01-14: qty 2.9

## 2024-01-14 MED ORDER — TRILACICLIB DIHYDROCHLORIDE INJECTION 300 MG
240.0000 mg/m2 | Freq: Once | INTRAVENOUS | Status: AC
Start: 2024-01-14 — End: 2024-01-14
  Administered 2024-01-14: 600 mg via INTRAVENOUS
  Filled 2024-01-14: qty 40

## 2024-01-14 NOTE — Telephone Encounter (Signed)
Per Dr.Mohamed, OK to treat with HR 110

## 2024-01-14 NOTE — Patient Instructions (Signed)
[facility] - A DEPT OF [facility]  Discharge Instructions: Thank you for choosing [facility] to provide your oncology and hematology care.   If you have a lab appointment with the Cancer Center, please go directly to the Cancer Center and check in at the registration area.   Wear comfortable clothing and clothing appropriate for easy access to any Portacath or PICC line.   We strive to give you quality time with your provider. You may need to reschedule your appointment if you arrive late (15 or more minutes).  Arriving late affects you and other patients whose appointments are after yours.  Also, if you miss three or more appointments without notifying the office, you may be dismissed from the clinic at the provider's discretion.      For prescription refill requests, have your pharmacy contact our office and allow 72 hours for refills to be completed.    Today you received the following chemotherapy and/or immunotherapy agents topotecan      To help prevent nausea and vomiting after your treatment, we encourage you to take your nausea medication as directed.  BELOW ARE SYMPTOMS THAT SHOULD BE REPORTED IMMEDIATELY: *FEVER GREATER THAN 100.4 F (38 C) OR HIGHER *CHILLS OR SWEATING *NAUSEA AND VOMITING THAT IS NOT CONTROLLED WITH YOUR NAUSEA MEDICATION *UNUSUAL SHORTNESS OF BREATH *UNUSUAL BRUISING OR BLEEDING *URINARY PROBLEMS (pain or burning when urinating, or frequent urination) *BOWEL PROBLEMS (unusual diarrhea, constipation, pain near the anus) TENDERNESS IN MOUTH AND THROAT WITH OR WITHOUT PRESENCE OF ULCERS (sore throat, sores in mouth, or a toothache) UNUSUAL RASH, SWELLING OR PAIN  UNUSUAL VAGINAL DISCHARGE OR ITCHING   Items with * indicate a potential emergency and should be followed up as soon as possible or go to the Emergency Department if any problems should occur.  Please show the CHEMOTHERAPY ALERT CARD or IMMUNOTHERAPY  ALERT CARD at check-in to the Emergency Department and triage nurse.  Should you have questions after your visit or need to cancel or reschedule your appointment, please contact [facility] - A DEPT OF [facility]  Dept: [phone]  and follow the prompts.  Office hours are 8:00 a.m. to 4:30 p.m. Monday - Friday. Please note that voicemails left after 4:00 p.m. may not be returned until the following business day.  We are closed weekends and major holidays. You have access to a nurse at all times for urgent questions. Please call the main number to the clinic Dept: [phone] and follow the prompts.   For any non-urgent questions, you may also contact your provider using MyChart. We now offer e-Visits for anyone 13 and older to request care online for non-urgent symptoms. For details visit [email or web address].   Also download the MyChart app! Go to the app store, search "MyChart", open the app, select [place], and log in with your MyChart username and password.

## 2024-01-15 ENCOUNTER — Inpatient Hospital Stay: Payer: Medicare Other

## 2024-01-15 ENCOUNTER — Ambulatory Visit
Admission: RE | Admit: 2024-01-15 | Discharge: 2024-01-15 | Disposition: A | Discharge status: Home / Self Care | Payer: Medicare Other | Source: Ambulatory Visit | Attending: Radiation Oncology

## 2024-01-15 ENCOUNTER — Other Ambulatory Visit: Payer: Self-pay

## 2024-01-15 VITALS — BP 91/70 | HR 100 | Temp 98.4°F | Resp 18

## 2024-01-15 DIAGNOSIS — C3411 Malignant neoplasm of upper lobe, right bronchus or lung: Secondary | ICD-10-CM

## 2024-01-15 DIAGNOSIS — Z510 Encounter for antineoplastic radiation therapy: Secondary | ICD-10-CM | POA: Diagnosis not present

## 2024-01-15 DIAGNOSIS — C7951 Secondary malignant neoplasm of bone: Secondary | ICD-10-CM | POA: Diagnosis not present

## 2024-01-15 LAB — RAD ONC ARIA SESSION SUMMARY
Course Elapsed Days: 3
Plan Fractions Treated to Date: 4
Plan Prescribed Dose Per Fraction: 2.5 Gy
Plan Total Fractions Prescribed: 15
Plan Total Prescribed Dose: 37.5 Gy
Reference Point Dosage Given to Date: 10 Gy
Reference Point Session Dosage Given: 2.5 Gy
Session Number: 4

## 2024-01-15 MED ORDER — TOPOTECAN HCL CHEMO INJECTION 4 MG
1.2000 mg/m2 | Freq: Once | INTRAVENOUS | Status: AC
  Administered 2024-01-15: 2.9 mg via INTRAVENOUS
  Filled 2024-01-15: qty 2.9

## 2024-01-15 MED ORDER — SODIUM CHLORIDE 0.9% FLUSH
10.0000 mL | INTRAVENOUS | Status: DC | PRN
  Administered 2024-01-15: 10 mL

## 2024-01-15 MED ORDER — PROCHLORPERAZINE MALEATE 10 MG PO TABS
10.0000 mg | ORAL_TABLET | Freq: Once | ORAL | Status: AC
Start: 2024-01-15 — End: 2024-01-15
  Administered 2024-01-15: 10 mg via ORAL
  Filled 2024-01-15: qty 1

## 2024-01-15 MED ORDER — SODIUM CHLORIDE 0.9 % IV SOLN
INTRAVENOUS | Status: DC
Start: 2024-01-15 — End: 2024-01-15

## 2024-01-15 MED ORDER — TRILACICLIB DIHYDROCHLORIDE INJECTION 300 MG
240.0000 mg/m2 | Freq: Once | INTRAVENOUS | Status: AC
  Administered 2024-01-15: 600 mg via INTRAVENOUS
  Filled 2024-01-15: qty 40

## 2024-01-15 MED ORDER — HEPARIN SOD (PORK) LOCK FLUSH 100 UNIT/ML IV SOLN
500.0000 [IU] | Freq: Once | INTRAVENOUS | Status: AC | PRN
  Administered 2024-01-15: 500 [IU]

## 2024-01-15 NOTE — Progress Notes (Unsigned)
Palliative Medicine [facility]  [phone] [phone]   Name: Adam Wise Date: 01/15/2024 MRN: [id number]  DOB: 02-07-1951  Patient Care Team: Doreene Nest, NP as PCP - General (Internal Medicine) Marykay Lex, MD as PCP - Cardiology (Cardiology) Syliva Overman, RN as Oncology Nurse Navigator    REASON FOR CONSULTATION: Adam Wise is a 73 y.o. 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 Palliative ask to see for symptom management and goals of care.    SOCIAL HISTORY:     reports that he has quit smoking. He has never used smokeless tobacco. He reports that he does not currently use alcohol. He reports that he does not currently use drugs.  ADVANCE DIRECTIVES:  Advanced directives on file naming Adam Wise, Adam Wise, Adam Wise, and Adam Wise as decision makers for this pt should Adam Wise become unable to speak for himself.   CODE STATUS: Full code  PAST MEDICAL HISTORY: Past Medical History:  Diagnosis Date   Acute on chronic respiratory failure with hypoxia (HCC) 10/12/2021   Chickenpox    Chronic knee pain    Chronic low back pain    COPD exacerbation (HCC) 10/11/2021   COPD with exacerbation (HCC) 10/12/2021   Coronary artery calcification seen on CAT scan 11/2021   Coronary CTA 11/27/2021: Moderate to large right pleural effusion and compressive atelectasis right lung base. => Coronary Calcium Score 657.  Diffuse RCA calcification.  Minimal mild disease in the LAD and diagonal branches. == Overall limited study.  Notable artifact.   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 He has disease progression after the first line of chemotherapy in December 2021.   Testosterone deficiency    Type 2 diabetes mellitus (HCC)     PAST SURGICAL HISTORY:  Past Surgical History:  Procedure Laterality Date   CHEST TUBE INSERTION Right 01/20/2022   Procedure: CHEST TUBE INSERTION;  Surgeon: Lorin Glass, MD;  Location: [facility] ENDOSCOPY;  Service: Pulmonary;  Laterality: Right;  w/ Talc Pleurodesis, planned admit for Obs afterwards   CHEST TUBE INSERTION Right 04/28/2022   Procedure: INSERTION PLEURAL DRAINAGE CATHETER - Pigtail tail, drainage;  Surgeon: Lorin Glass, MD;  Location: [facility] ENDOSCOPY;  Service: Pulmonary;  Laterality: Right;  talc pleurodesis   CHEST TUBE INSERTION Right 09/01/2022   Procedure: INSERTION PLEURAL DRAINAGE CATHETER;  Surgeon: Lupita Leash, MD;  Location: [facility] ENDOSCOPY;  Service: Cardiopulmonary;  Laterality: Right;  pigtail catheter placement with talc pleurodesis   COLONOSCOPY WITH PROPOFOL N/A 12/17/2018   Procedure: COLONOSCOPY WITH PROPOFOL;  Surgeon: Pasty Spillers, MD;  Location: [facility] ENDOSCOPY;  Service: Gastroenterology;  Laterality: N/A;   IR IMAGING GUIDED PORT INSERTION  04/17/2020   IR THORACENTESIS ASP PLEURAL SPACE W/IMG GUIDE  11/29/2021   IR THORACENTESIS ASP PLEURAL SPACE W/IMG GUIDE  01/03/2022   JOINT REPLACEMENT Bilateral    REPLACEMENT TOTAL KNEE BILATERAL  2015   TALC PLEURODESIS  09/01/2022   Procedure: TALC PLEURADESIS;  Surgeon: Max Fickle  B, MD;  Location: [facility] ENDOSCOPY;  Service: Cardiopulmonary;;   TONSILLECTOMY  1960   TRANSTHORACIC ECHOCARDIOGRAM  05/2020   a) 05/2020: EF 55 to 60%.  No R WMA.  Mild LVH.  Indeterminate LVEDP.  Unable to assess RVP.  Normal aortic and mitral valves.  Mildly elevated RAP.; b) 09/2021: EF 50-55%. No RWMA. Mild LVH. ~  LVEDP. Mild LA Dilation. NORMAL RV/RAP.  Normal MV/AoV.    HEMATOLOGY/ONCOLOGY HISTORY:  Oncology History  Malignant neoplasm of upper lobe of right lung (HCC)  04/04/2020 Initial Diagnosis   Malignant neoplasm of upper lobe of right lung (HCC)   04/23/2020 - 11/13/2020 Chemotherapy   The patient had palonosetron (ALOXI) injection 0.25 mg, 0.25 mg, Intravenous,  Once, 4 of 4 cycles Administration: 0.25 mg (04/23/2020), 0.25 mg (06/04/2020), 0.25 mg (06/26/2020), 0.25 mg (05/15/2020) pegfilgrastim-jmdb (FULPHILA) injection 6 mg, 6 mg, Subcutaneous,  Once, 2 of 2 cycles Administration: 6 mg (06/08/2020) Trilaciclib Dihydrochloride SOLR 580 mg, 580 mg (100 % of original dose 580 mg), Intravenous,  Once, 1 of 1 cycle Dose modification: 580 mg (original dose 580 mg, Cycle 1) CARBOplatin (PARAPLATIN) 600 mg in sodium chloride 0.9 % 250 mL chemo infusion, 520 mg (100 % of original dose 521.5 mg), Intravenous,  Once, 4 of 4 cycles Dose modification: 521.5 mg (original dose 521.5 mg, Cycle 1), 659.5 mg (original dose 659.5 mg, Cycle 3), 627.5 mg (original dose 627.5 mg, Cycle 2) Administration: 600 mg (04/23/2020), 659.5 mg (06/04/2020), 660 mg (06/26/2020), 630 mg (05/15/2020) etoposide (VEPESID) 240 mg in sodium chloride 0.9 % 1,000 mL chemo infusion, 100 mg/m2 = 240 mg, Intravenous,  Once, 4 of 4 cycles Administration: 240 mg (04/23/2020), 240 mg (04/24/2020), 240 mg (04/25/2020), 240 mg (06/04/2020), 240 mg (06/05/2020), 240 mg (06/06/2020), 240 mg (06/26/2020), 240 mg (06/27/2020), 240 mg (06/28/2020), 240 mg (05/15/2020), 240 mg (05/18/2020) fosaprepitant (EMEND) 150 mg in sodium chloride 0.9 % 145 mL IVPB, 150 mg, Intravenous,  Once, 4 of 4 cycles Administration: 150 mg (04/23/2020), 150 mg (06/04/2020), 150 mg (06/26/2020), 150 mg (05/15/2020) Trilaciclib Dihydrochloride 580 mg in sodium chloride 0.9 % 250 mL chemo infusion, , Intravenous, Once, 1 of 1 cycle Administration: 580 mg (04/24/2020),  (04/25/2020) durvalumab  (IMFINZI) 1,500 mg in sodium chloride 0.9 % 100 mL chemo infusion, 1,500 mg, Intravenous,  Once, 9 of 12 cycles Administration: 1,500 mg (04/23/2020), 1,500 mg (06/04/2020), 1,500 mg (06/26/2020), 1,500 mg (07/17/2020), 1,500 mg (08/14/2020), 1,500 mg (10/16/2020), 1,500 mg (05/15/2020), 1,500 mg (11/13/2020) trilaciclib dihydrochloride (COSELA) 600 mg in dextrose 5 % 250 mL (2.069 mg/mL) infusion, 240 mg/m2 = 600 mg, Intravenous,  Once, 3 of 3 cycles Administration: 600 mg (05/15/2020), 600 mg (05/18/2020), 600 mg (06/04/2020), 600 mg (06/05/2020), 600 mg (06/06/2020), 600 mg (06/26/2020), 600 mg (06/27/2020), 600 mg (06/28/2020)  for chemotherapy treatment.    12/19/2020 - 05/14/2021 Chemotherapy         06/05/2021 - 01/23/2022 Chemotherapy   Patient is on Treatment Plan : LUNG SMALL CELL Lurbinectedin q21d     02/18/2022 - 04/11/2022 Chemotherapy   Patient is on Treatment Plan : LUNG Irinotecan D1,8 q21d     05/28/2022 - 04/17/2023 Chemotherapy   Patient is on Treatment Plan : LUNG SMALL CELL Carboplatin (AUC 5) D1 + Etoposide (100) D1-3 q21d     01/11/2024 -  Chemotherapy   Patient is on Treatment Plan : LUNG SMALL CELL Topotecan (1.5) D1-5 q21d     Small cell lung cancer, right upper lobe (HCC)  04/12/2020 Initial  Diagnosis   Small cell lung cancer, right upper lobe (HCC)   12/19/2020 - 05/14/2021 Chemotherapy         12/25/2020 Cancer Staging   Staging form: Lung, AJCC 8th Edition - Clinical: Stage IVB (cT4, cN3, cM1c) - Signed by Si Gaul, MD on 12/25/2020   06/05/2021 - 01/23/2022 Chemotherapy   Patient is on Treatment Plan : LUNG SMALL CELL Lurbinectedin q21d     02/18/2022 - 04/11/2022 Chemotherapy   Patient is on Treatment Plan : LUNG Irinotecan D1,8 q21d     05/28/2022 - 04/17/2023 Chemotherapy   Patient is on Treatment Plan : LUNG SMALL CELL Carboplatin (AUC 5) D1 + Etoposide (100) D1-3 q21d     01/11/2024 -  Chemotherapy   Patient is on Treatment Plan : LUNG SMALL CELL Topotecan (1.5) D1-5 q21d        ALLERGIES:  is allergic to bupropion and hydrochlorothiazide.  MEDICATIONS:  Current Outpatient Medications  Medication Sig Dispense Refill   albuterol (VENTOLIN HFA) 108 (90 Base) MCG/ACT inhaler Inhale 2 puffs into the lungs every 4 (four) hours as needed for shortness of breath. 1 each 0   amLODipine (NORVASC) 10 MG tablet TAKE 1 TABLET BY MOUTH EVERY DAY FOR BLOOD PRESSURE 90 tablet 2   azithromycin (ZITHROMAX) 250 MG tablet Take 1 tablet (250 mg total) by mouth daily. Take first 2 tablets together, then 1 every day until finished. 6 tablet 0   B-D 3CC LUER-LOK SYR 22GX1" 22G X 1" 3 ML MISC USE AS INSTRUCTED FOR TESTOSTERONE INJECTION EVERY 2 WEEKS 10 each 2   Black Pepper-Turmeric (TURMERIC COMPLEX/BLACK PEPPER PO) Take 1 tablet by mouth in the morning and at bedtime.     Coenzyme Q10 (COQ10) 200 MG CAPS Take 200 mg by mouth at bedtime.     cyclobenzaprine (FLEXERIL) 5 MG tablet TAKE 1 TABLET BY MOUTH AT BEDTIME AS NEEDED FOR MUSCLE SPASMS. 90 tablet 0   docusate sodium (COLACE) 100 MG capsule Take 1 capsule (100 mg total) by mouth every 12 (twelve) hours. (Patient not taking: Reported on 08/10/2023) 60 capsule 0   ELIQUIS 5 MG TABS tablet TAKE 1 TABLET BY MOUTH TWICE A DAY 180 tablet 2   esomeprazole (NEXIUM) 20 MG capsule Take 20 mg by mouth in the morning.     gabapentin (NEURONTIN) 300 MG capsule TAKE 1 CAPSULE (300 MG TOTAL) BY MOUTH 2 (TWO) TIMES DAILY. FOR BACK PAIN. 180 capsule 1   glipiZIDE (GLUCOTROL) 5 MG tablet Take 0.5 tablets (2.5 mg total) by mouth 2 (two) times daily before a meal. for diabetes. 90 tablet 1   glucose blood (ONETOUCH ULTRA) test strip USE UP TO 4 TIMES DAILY AS DIRECTED 400 strip 5   Insulin Glargine (BASAGLAR KWIKPEN) 100 UNIT/ML INJECT 24 UNITS INTO THE SKIN AT BEDTIME. FOR DIABETES. (Patient taking differently: Inject 0-24 Units into the skin See admin instructions. Inject 24 units into the skin at bedtime and PER SLIDING SCALE in the morning AS  NEEDED FOR AN ELEVATED BGL, possibly caused by other prescribed medications) 30 mL 1   Insulin Pen Needle (BD PEN NEEDLE NANO U/F) 32G X 4 MM MISC Use with insulin as prescribed Dx Code: E11.9 100 each 3   ipratropium-albuterol (DUONEB) 0.5-2.5 (3) MG/3ML SOLN Take 3 mLs by nebulization every 4 (four) hours while awake for 3 days, THEN 3 mLs every 4 (four) hours as needed (shortness of breath or wheezing). 360 mL 0   KRILL OIL PO Take 1 capsule  by mouth daily.     Lancets MISC USE UP TO 3 TIMES DAILY AS DIRECTED 100 each 2   lidocaine (LIDODERM) 5 % Place 1 patch onto the skin daily. Remove & Discard patch within 12 hours or as directed by MD 30 patch 0   losartan (COZAAR) 50 MG tablet TAKE 1 TABLET (50 MG TOTAL) BY MOUTH DAILY. FOR BLOOD PRESSURE. 90 tablet 3   Melatonin 10 MG CAPS Take 10 mg by mouth at bedtime as needed (for sleep).     metFORMIN (GLUCOPHAGE) 1000 MG tablet TAKE 1 TABLET (1,000 MG TOTAL) BY MOUTH 2 (TWO) TIMES DAILY WITH A MEAL. FOR DIABETES 180 tablet 0   morphine (MS CONTIN) 30 MG 12 hr tablet Take 30 mg by mouth 2 (two) times daily.     Multiple Vitamin (MULTI-VITAMINS) TABS Take 1 tablet by mouth daily with breakfast.     Naloxone HCl 3 MG/0.1ML LIQD Place 1 spray into both nostrils once. For known/suspected opiod overdose. Every 2-3 minutes in alternating nostril till EMS arrives.     ondansetron (ZOFRAN) 4 MG tablet Take 1 tablet (4 mg total) by mouth every 8 (eight) hours as needed for nausea or vomiting. 30 tablet 0   oxyCODONE-acetaminophen (PERCOCET) 10-325 MG tablet Take 1 tablet by mouth every 4 (four) hours as needed.     pioglitazone (ACTOS) 45 MG tablet TAKE 1 TABLET (45 MG TOTAL) BY MOUTH DAILY. FOR DIABETES. 90 tablet 1   pravastatin (PRAVACHOL) 40 MG tablet TAKE 1 TABLET BY MOUTH EVERY DAY IN THE EVENING FOR CHOLESTEROL 90 tablet 3   PRESCRIPTION MEDICATION CPAP- At bedtime     testosterone cypionate (DEPOTESTOSTERONE CYPIONATE) 200 MG/ML injection Inject 200 mg  into the muscle every 14 (fourteen) days.     No current facility-administered medications for this visit.   Facility-Administered Medications Ordered in Other Visits  Medication Dose Route Frequency Provider Last Rate Last Admin   0.9 %  sodium chloride infusion   Intravenous Continuous Si Gaul, MD   Stopped at 01/15/24 1316   sodium chloride flush (NS) 0.9 % injection 10 mL  10 mL Intracatheter PRN Si Gaul, MD   10 mL at 01/15/24 1318    VITAL SIGNS: There were no vitals taken for this visit. There were no vitals filed for this visit.  Estimated body mass index is 35.7 kg/m as calculated from the following:   Height as of 01/14/24: 5\' 9"  (1.753 m).   Weight as of 01/14/24: 241 lb 12 oz (109.7 kg).  LABS: CBC:    Component Value Date/Time   WBC 8.4 01/11/2024 0845   WBC 3.4 (L) 08/14/2023 0347   HGB 10.8 (L) 01/11/2024 0845   HCT 34.3 (L) 01/11/2024 0845   HCT 42.7 10/06/2017 1052   PLT 310 01/11/2024 0845   MCV 92.2 01/11/2024 0845   NEUTROABS 7.1 01/11/2024 0845   LYMPHSABS 0.6 (L) 01/11/2024 0845   MONOABS 0.6 01/11/2024 0845   EOSABS 0.0 01/11/2024 0845   BASOSABS 0.0 01/11/2024 0845   Comprehensive Metabolic Panel:    Component Value Date/Time   NA 138 01/11/2024 0845   K 4.2 01/11/2024 0845   CL 99 01/11/2024 0845   CO2 30 01/11/2024 0845   BUN 19 01/11/2024 0845   CREATININE 1.06 01/11/2024 0845   CREATININE 1.45 (H) 10/18/2021 1533   GLUCOSE 67 (L) 01/11/2024 0845   CALCIUM 9.3 01/11/2024 0845   AST 19 01/11/2024 0845   ALT 17 01/11/2024 0845  ALKPHOS 93 01/11/2024 0845   BILITOT 0.4 01/11/2024 0845   PROT 6.9 01/11/2024 0845   ALBUMIN 3.7 01/11/2024 0845    RADIOGRAPHIC STUDIES: DG Chest Port 1 View Result Date: 01/04/2024 CLINICAL DATA:  Wheezing and productive cough. Known small-cell lung cancer. EXAM: PORTABLE CHEST 1 VIEW COMPARISON:  X-ray 08/10/2023. FINDINGS: Stable right IJ chest port. Enlarged cardiopericardial silhouette.  Perihilar opacity seen along the right hemithorax with volume loss and pleural thickening. Appearance is similar to previous. Small right effusion. Stable appearance to the clear left lung. No left-sided pneumothorax or effusion. Overlapping cardiac leads. Film is under penetrated. IMPRESSION: No significant interval change. Opacity along the right perihilar region with volume loss of the right lung. Pleural thickening. Chest port. Small right effusion. Electronically Signed   By: Karen Kays M.D.   On: 01/04/2024 11:49   CT Head Wo Contrast Result Date: 01/04/2024 CLINICAL DATA:  mental status change, known small cell carcinoma EXAM: CT HEAD WITHOUT CONTRAST TECHNIQUE: Contiguous axial images were obtained from the base of the skull through the vertex without intravenous contrast. RADIATION DOSE REDUCTION: This exam was performed according to the departmental dose-optimization program which includes automated exposure control, adjustment of the mA and/or kV according to patient size and/or use of iterative reconstruction technique. COMPARISON:  MR Head 02/06/23 FINDINGS: Brain: No hemorrhage. No hydrocephalus. No extra-axial fluid collection. No CT evidence of an acute cortical infarct. No mass effect. No mass lesion. Vascular: No hyperdense vessel or unexpected calcification. Skull: Normal. Negative for fracture or focal lesion. Sinuses/Orbits: No middle ear or mastoid effusion. Air-fluid level in the right maxillary sinus, which can be seen in the setting of acute sinusitis. Orbits are unremarkable. Other: None. IMPRESSION: 1. No acute intracranial abnormality. 2. Air-fluid level in the right maxillary sinus, which can be seen in the setting of acute sinusitis. Electronically Signed   By: Lorenza Cambridge M.D.   On: 01/04/2024 11:18    PERFORMANCE STATUS (ECOG) : {CHL ONC ECOG ZO:[id number]}  Review of Systems Unless otherwise noted, a complete review of systems is negative.  Physical Exam General:  NAD Cardiovascular: regular rate and rhythm Pulmonary: clear ant fields Abdomen: soft, nontender, + bowel sounds Extremities: no edema, no joint deformities Skin: no rashes Neurological: Alert and oriented x3  IMPRESSION: *** I introduced myself, Maygan RN, and Palliative's role in collaboration with the oncology team. Concept of Palliative Care was introduced as specialized medical care for people and their families living with serious illness.  It focuses on providing relief from the symptoms and stress of a serious illness.  The goal is to improve quality of life for both the patient and the family. Values and goals of care important to patient and family were attempted to be elicited.    We discussed *** current illness and what it means in the larger context of *** on-going co-morbidities. Natural disease trajectory and expectations were discussed.  I discussed the importance of continued conversation with family and their medical providers regarding overall plan of care and treatment options, ensuring decisions are within the context of the patients values and GOCs.  PLAN: Established therapeutic relationship. Education provided on palliative's role in collaboration with their Oncology/Radiation team. I will plan to see patient back in 2-4 weeks in collaboration to other oncology appointments.    Patient expressed understanding and was in agreement with this plan. He also understands that He can call the clinic at any time with any questions, concerns, or complaints.   Thank  you for your referral and allowing Palliative to assist in Adam Wise's care.   Number and complexity of problems addressed: ***HIGH - 1 or more chronic illnesses with SEVERE exacerbation, progression, or side effects of treatment - advanced cancer, pain. Any controlled substances utilized were prescribed in the context of palliative care.   Visit consisted of counseling and education dealing with the  complex and emotionally intense issues of symptom management and palliative care in the setting of serious and potentially life-threatening illness.  Signed by: Willette Alma, AGPCNP-BC Palliative Medicine Team/ Cancer Center

## 2024-01-15 NOTE — Patient Instructions (Signed)
 [facility] A DEPT [facility]  Discharge Instructions: Thank you for choosing [facility] to provide your oncology and hematology care.   If you have a lab appointment with the Cancer Center, please go directly to the Cancer Center and check in at the registration area.   Wear comfortable clothing and clothing appropriate for easy access to any Portacath or PICC line.   We strive to give you quality time with your provider. You may need to reschedule your appointment if you arrive late (15 or more minutes).  Arriving late affects you and other patients whose appointments are after yours.  Also, if you miss three or more appointments without notifying the office, you may be dismissed from the clinic at the provider's discretion.      For prescription refill requests, have your pharmacy contact our office and allow 72 hours for refills to be completed.    Today you received the following chemotherapy and/or immunotherapy agents Topotecan, Cosela   To help prevent nausea and vomiting after your treatment, we encourage you to take your nausea medication as directed.  BELOW ARE SYMPTOMS THAT SHOULD BE REPORTED IMMEDIATELY: *FEVER GREATER THAN 100.4 F (38 C) OR HIGHER *CHILLS OR SWEATING *NAUSEA AND VOMITING THAT IS NOT CONTROLLED WITH YOUR NAUSEA MEDICATION *UNUSUAL SHORTNESS OF BREATH *UNUSUAL BRUISING OR BLEEDING *URINARY PROBLEMS (pain or burning when urinating, or frequent urination) *BOWEL PROBLEMS (unusual diarrhea, constipation, pain near the anus) TENDERNESS IN MOUTH AND THROAT WITH OR WITHOUT PRESENCE OF ULCERS (sore throat, sores in mouth, or a toothache) UNUSUAL RASH, SWELLING OR PAIN  UNUSUAL VAGINAL DISCHARGE OR ITCHING   Items with * indicate a potential emergency and should be followed up as soon as possible or go to the Emergency Department if any problems should occur.  Please show the CHEMOTHERAPY ALERT CARD or  IMMUNOTHERAPY ALERT CARD at check-in to the Emergency Department and triage nurse.  Should you have questions after your visit or need to cancel or reschedule your appointment, please contact [facility]  Dept: [phone]  and follow the prompts.  Office hours are 8:00 a.m. to 4:30 p.m. Monday - Friday. Please note that voicemails left after 4:00 p.m. may not be returned until the following business day.  We are closed weekends and major holidays. You have access to a nurse at all times for urgent questions. Please call the main number to the clinic Dept: [phone] and follow the prompts.   For any non-urgent questions, you may also contact your provider using MyChart. We now offer e-Visits for anyone 61 and older to request care online for non-urgent symptoms. For details visit [email or web address].   Also download the MyChart app! Go to the app store, search "MyChart", open the app, select [facility], and log in with your MyChart username and password.

## 2024-01-18 ENCOUNTER — Inpatient Hospital Stay: Payer: Medicare Other

## 2024-01-18 ENCOUNTER — Encounter: Payer: Self-pay | Admitting: Nurse Practitioner

## 2024-01-18 ENCOUNTER — Inpatient Hospital Stay (HOSPITAL_BASED_OUTPATIENT_CLINIC_OR_DEPARTMENT_OTHER): Payer: Medicare Other | Admitting: Nurse Practitioner

## 2024-01-18 ENCOUNTER — Ambulatory Visit
Admission: RE | Admit: 2024-01-18 | Discharge: 2024-01-18 | Disposition: A | Discharge status: Home / Self Care | Payer: Medicare Other | Source: Ambulatory Visit | Attending: Radiation Oncology | Admitting: Radiation Oncology

## 2024-01-18 ENCOUNTER — Telehealth: Payer: Self-pay

## 2024-01-18 ENCOUNTER — Other Ambulatory Visit: Payer: Self-pay

## 2024-01-18 VITALS — BP 94/68 | HR 120 | Temp 97.6°F | Resp 18 | Wt 241.4 lb

## 2024-01-18 DIAGNOSIS — I959 Hypotension, unspecified: Secondary | ICD-10-CM | POA: Insufficient documentation

## 2024-01-18 DIAGNOSIS — G893 Neoplasm related pain (acute) (chronic): Secondary | ICD-10-CM

## 2024-01-18 DIAGNOSIS — C7951 Secondary malignant neoplasm of bone: Secondary | ICD-10-CM | POA: Insufficient documentation

## 2024-01-18 DIAGNOSIS — R634 Abnormal weight loss: Secondary | ICD-10-CM

## 2024-01-18 DIAGNOSIS — R630 Anorexia: Secondary | ICD-10-CM

## 2024-01-18 DIAGNOSIS — D61810 Antineoplastic chemotherapy induced pancytopenia: Secondary | ICD-10-CM | POA: Insufficient documentation

## 2024-01-18 DIAGNOSIS — Z87891 Personal history of nicotine dependence: Secondary | ICD-10-CM | POA: Diagnosis not present

## 2024-01-18 DIAGNOSIS — Z79899 Other long term (current) drug therapy: Secondary | ICD-10-CM | POA: Insufficient documentation

## 2024-01-18 DIAGNOSIS — Z5111 Encounter for antineoplastic chemotherapy: Secondary | ICD-10-CM | POA: Insufficient documentation

## 2024-01-18 DIAGNOSIS — R530 Neoplastic (malignant) related fatigue: Secondary | ICD-10-CM | POA: Diagnosis not present

## 2024-01-18 DIAGNOSIS — Z515 Encounter for palliative care: Secondary | ICD-10-CM | POA: Diagnosis not present

## 2024-01-18 DIAGNOSIS — C3411 Malignant neoplasm of upper lobe, right bronchus or lung: Secondary | ICD-10-CM

## 2024-01-18 DIAGNOSIS — Z510 Encounter for antineoplastic radiation therapy: Secondary | ICD-10-CM | POA: Insufficient documentation

## 2024-01-18 DIAGNOSIS — C787 Secondary malignant neoplasm of liver and intrahepatic bile duct: Secondary | ICD-10-CM | POA: Insufficient documentation

## 2024-01-18 DIAGNOSIS — D6481 Anemia due to antineoplastic chemotherapy: Secondary | ICD-10-CM

## 2024-01-18 LAB — CBC WITH DIFFERENTIAL (CANCER CENTER ONLY)
Abs Immature Granulocytes: 0.02 10*3/uL (ref 0.00–0.07)
Basophils Absolute: 0 10*3/uL (ref 0.0–0.1)
Basophils Relative: 1 %
Eosinophils Absolute: 0 10*3/uL (ref 0.0–0.5)
Eosinophils Relative: 1 %
HCT: 31.1 % — ABNORMAL LOW (ref 39.0–52.0)
Hemoglobin: 9.9 g/dL — ABNORMAL LOW (ref 13.0–17.0)
Immature Granulocytes: 1 %
Lymphocytes Relative: 8 %
Lymphs Abs: 0.3 10*3/uL — ABNORMAL LOW (ref 0.7–4.0)
MCH: 28.9 pg (ref 26.0–34.0)
MCHC: 31.8 g/dL (ref 30.0–36.0)
MCV: 90.7 fL (ref 80.0–100.0)
Monocytes Absolute: 0 10*3/uL — ABNORMAL LOW (ref 0.1–1.0)
Monocytes Relative: 1 %
Neutro Abs: 3.2 10*3/uL (ref 1.7–7.7)
Neutrophils Relative %: 88 %
Platelet Count: 128 10*3/uL — ABNORMAL LOW (ref 150–400)
RBC: 3.43 MIL/uL — ABNORMAL LOW (ref 4.22–5.81)
RDW: 14 % (ref 11.5–15.5)
Smear Review: NORMAL
WBC Count: 3.6 10*3/uL — ABNORMAL LOW (ref 4.0–10.5)
nRBC: 0 % (ref 0.0–0.2)

## 2024-01-18 LAB — CMP (CANCER CENTER ONLY)
ALT: 15 U/L (ref 0–44)
AST: 15 U/L (ref 15–41)
Albumin: 3.6 g/dL (ref 3.5–5.0)
Alkaline Phosphatase: 81 U/L (ref 38–126)
Anion gap: 9 (ref 5–15)
BUN: 22 mg/dL (ref 8–23)
CO2: 25 mmol/L (ref 22–32)
Calcium: 9 mg/dL (ref 8.9–10.3)
Chloride: 98 mmol/L (ref 98–111)
Creatinine: 1.28 mg/dL — ABNORMAL HIGH (ref 0.61–1.24)
GFR, Estimated: 59 mL/min — ABNORMAL LOW (ref >60)
Glucose, Bld: 161 mg/dL — ABNORMAL HIGH (ref 70–99)
Potassium: 4.8 mmol/L (ref 3.5–5.1)
Sodium: 132 mmol/L — ABNORMAL LOW (ref 135–145)
Total Bilirubin: 0.7 mg/dL (ref 0.0–1.2)
Total Protein: 6.5 g/dL (ref 6.5–8.1)

## 2024-01-18 LAB — SAMPLE TO BLOOD BANK

## 2024-01-18 LAB — RAD ONC ARIA SESSION SUMMARY
Course Elapsed Days: 6
Plan Fractions Treated to Date: 5
Plan Prescribed Dose Per Fraction: 2.5 Gy
Plan Total Fractions Prescribed: 15
Plan Total Prescribed Dose: 37.5 Gy
Reference Point Dosage Given to Date: 12.5 Gy
Reference Point Session Dosage Given: 2.5 Gy
Session Number: 5

## 2024-01-18 MED ORDER — ONDANSETRON HCL 4 MG PO TABS: 4.0000 mg | ORAL_TABLET | Freq: Three times a day (TID) | ORAL | 0 refills | Status: DC | PRN

## 2024-01-18 MED ORDER — MORPHINE SULFATE ER 30 MG PO TBCR: 30.0000 mg | EXTENDED_RELEASE_TABLET | Freq: Two times a day (BID) | ORAL | 0 refills | Status: DC

## 2024-01-18 NOTE — Telephone Encounter (Signed)
-----   Message from Nurse Shoreline H sent at 01/12/2024  1:39 PM EST ----- Regarding: FW: FT chemo Mohamed  ----- Message ----- From: Edger House, RN Sent: 01/11/2024  12:08 PM EST To: Onc Triage Nurse [facility] Subject: FT chemo Mohamed                               Pt with first time topotecan (has had numerous other chemo's before). Tolerated well.

## 2024-01-18 NOTE — Telephone Encounter (Signed)
Wife Adam Wise states that Adam Wise is doing fine. He is eating, drinking, and urinating well. They know to call the office at [phone] if they have any questions or concerns.

## 2024-01-19 ENCOUNTER — Telehealth: Payer: Self-pay

## 2024-01-19 ENCOUNTER — Other Ambulatory Visit: Payer: Self-pay

## 2024-01-19 ENCOUNTER — Inpatient Hospital Stay: Payer: Medicare Other

## 2024-01-19 ENCOUNTER — Ambulatory Visit
Admission: RE | Admit: 2024-01-19 | Discharge: 2024-01-19 | Disposition: A | Discharge status: Home / Self Care | Payer: Medicare Other | Source: Ambulatory Visit | Attending: Radiation Oncology

## 2024-01-19 VITALS — BP 95/73 | HR 115 | Temp 98.5°F | Resp 18

## 2024-01-19 DIAGNOSIS — C7951 Secondary malignant neoplasm of bone: Secondary | ICD-10-CM | POA: Diagnosis not present

## 2024-01-19 DIAGNOSIS — C3411 Malignant neoplasm of upper lobe, right bronchus or lung: Secondary | ICD-10-CM

## 2024-01-19 DIAGNOSIS — Z95828 Presence of other vascular implants and grafts: Secondary | ICD-10-CM

## 2024-01-19 DIAGNOSIS — Z515 Encounter for palliative care: Secondary | ICD-10-CM

## 2024-01-19 DIAGNOSIS — G893 Neoplasm related pain (acute) (chronic): Secondary | ICD-10-CM

## 2024-01-19 DIAGNOSIS — Z510 Encounter for antineoplastic radiation therapy: Secondary | ICD-10-CM | POA: Diagnosis not present

## 2024-01-19 LAB — RAD ONC ARIA SESSION SUMMARY
Course Elapsed Days: 7
Plan Fractions Treated to Date: 6
Plan Prescribed Dose Per Fraction: 2.5 Gy
Plan Total Fractions Prescribed: 15
Plan Total Prescribed Dose: 37.5 Gy
Reference Point Dosage Given to Date: 15 Gy
Reference Point Session Dosage Given: 2.5 Gy
Session Number: 6

## 2024-01-19 MED ORDER — SODIUM CHLORIDE 0.9% FLUSH
10.0000 mL | Freq: Once | INTRAVENOUS | Status: AC
Start: 2024-01-19 — End: 2024-01-19
  Administered 2024-01-19: 10 mL

## 2024-01-19 MED ORDER — SODIUM CHLORIDE 0.9 % IV SOLN: Freq: Once | INTRAVENOUS | Status: DC

## 2024-01-19 MED ORDER — HEPARIN SOD (PORK) LOCK FLUSH 100 UNIT/ML IV SOLN
500.0000 [IU] | Freq: Once | INTRAVENOUS | Status: AC
Start: 2024-01-19 — End: 2024-01-19
  Administered 2024-01-19: 500 [IU]

## 2024-01-19 MED ORDER — SODIUM CHLORIDE 0.9 % IV SOLN: Freq: Once | INTRAVENOUS | Status: AC

## 2024-01-19 NOTE — Patient Instructions (Signed)

## 2024-01-19 NOTE — Telephone Encounter (Signed)
Called pt to notify of IVF appt schedule for today after radiation appointment. Spoke with Mrs.Persley and confirmed appt, no further needs at this time. IVF order placed under signed and held.

## 2024-01-20 ENCOUNTER — Ambulatory Visit
Admission: RE | Admit: 2024-01-20 | Discharge: 2024-01-20 | Disposition: A | Discharge status: Home / Self Care | Payer: Medicare Other | Source: Ambulatory Visit | Attending: Radiation Oncology | Admitting: Radiation Oncology

## 2024-01-20 ENCOUNTER — Other Ambulatory Visit: Payer: Self-pay

## 2024-01-20 DIAGNOSIS — Z510 Encounter for antineoplastic radiation therapy: Secondary | ICD-10-CM | POA: Diagnosis not present

## 2024-01-20 DIAGNOSIS — C3411 Malignant neoplasm of upper lobe, right bronchus or lung: Secondary | ICD-10-CM | POA: Diagnosis not present

## 2024-01-20 DIAGNOSIS — C7951 Secondary malignant neoplasm of bone: Secondary | ICD-10-CM | POA: Diagnosis not present

## 2024-01-20 LAB — RAD ONC ARIA SESSION SUMMARY
Course Elapsed Days: 8
Plan Fractions Treated to Date: 7
Plan Prescribed Dose Per Fraction: 2.5 Gy
Plan Total Fractions Prescribed: 15
Plan Total Prescribed Dose: 37.5 Gy
Reference Point Dosage Given to Date: 17.5 Gy
Reference Point Session Dosage Given: 2.5 Gy
Session Number: 7

## 2024-01-21 ENCOUNTER — Other Ambulatory Visit: Payer: Self-pay

## 2024-01-21 ENCOUNTER — Ambulatory Visit
Admission: RE | Admit: 2024-01-21 | Discharge: 2024-01-21 | Disposition: A | Discharge status: Home / Self Care | Payer: Medicare Other | Source: Ambulatory Visit | Attending: Radiation Oncology | Admitting: Radiation Oncology

## 2024-01-21 DIAGNOSIS — C7951 Secondary malignant neoplasm of bone: Secondary | ICD-10-CM | POA: Diagnosis not present

## 2024-01-21 DIAGNOSIS — C3411 Malignant neoplasm of upper lobe, right bronchus or lung: Secondary | ICD-10-CM | POA: Diagnosis not present

## 2024-01-21 DIAGNOSIS — Z510 Encounter for antineoplastic radiation therapy: Secondary | ICD-10-CM | POA: Diagnosis not present

## 2024-01-21 LAB — RAD ONC ARIA SESSION SUMMARY
Course Elapsed Days: 9
Plan Fractions Treated to Date: 8
Plan Prescribed Dose Per Fraction: 2.5 Gy
Plan Total Fractions Prescribed: 15
Plan Total Prescribed Dose: 37.5 Gy
Reference Point Dosage Given to Date: 20 Gy
Reference Point Session Dosage Given: 2.5 Gy
Session Number: 8

## 2024-01-22 ENCOUNTER — Other Ambulatory Visit: Payer: Self-pay

## 2024-01-22 ENCOUNTER — Ambulatory Visit
Admission: RE | Admit: 2024-01-22 | Discharge: 2024-01-22 | Disposition: A | Discharge status: Home / Self Care | Payer: Medicare Other | Source: Ambulatory Visit | Attending: Radiation Oncology | Admitting: Radiation Oncology

## 2024-01-22 ENCOUNTER — Ambulatory Visit
Admission: RE | Admit: 2024-01-22 | Discharge: 2024-01-22 | Disposition: A | Discharge status: Home / Self Care | Payer: Medicare Other | Source: Ambulatory Visit | Attending: Radiation Oncology

## 2024-01-22 DIAGNOSIS — C7951 Secondary malignant neoplasm of bone: Secondary | ICD-10-CM | POA: Diagnosis not present

## 2024-01-22 DIAGNOSIS — Z87891 Personal history of nicotine dependence: Secondary | ICD-10-CM | POA: Diagnosis not present

## 2024-01-22 DIAGNOSIS — C3411 Malignant neoplasm of upper lobe, right bronchus or lung: Secondary | ICD-10-CM | POA: Diagnosis not present

## 2024-01-22 DIAGNOSIS — Z510 Encounter for antineoplastic radiation therapy: Secondary | ICD-10-CM | POA: Diagnosis not present

## 2024-01-22 LAB — RAD ONC ARIA SESSION SUMMARY
Course Elapsed Days: 10
Plan Fractions Treated to Date: 9
Plan Prescribed Dose Per Fraction: 2.5 Gy
Plan Total Fractions Prescribed: 15
Plan Total Prescribed Dose: 37.5 Gy
Reference Point Dosage Given to Date: 22.5 Gy
Reference Point Session Dosage Given: 2.5 Gy
Session Number: 9

## 2024-01-25 ENCOUNTER — Other Ambulatory Visit: Payer: Medicare Other

## 2024-01-25 ENCOUNTER — Ambulatory Visit: Payer: Medicare Other

## 2024-01-25 ENCOUNTER — Other Ambulatory Visit: Payer: Self-pay | Admitting: Primary Care

## 2024-01-25 ENCOUNTER — Encounter: Payer: Self-pay | Admitting: Medical Oncology

## 2024-01-25 ENCOUNTER — Inpatient Hospital Stay: Payer: Medicare Other

## 2024-01-25 ENCOUNTER — Ambulatory Visit: Payer: Medicare Other | Admitting: Internal Medicine

## 2024-01-25 DIAGNOSIS — E119 Type 2 diabetes mellitus without complications: Secondary | ICD-10-CM

## 2024-01-25 DIAGNOSIS — C3411 Malignant neoplasm of upper lobe, right bronchus or lung: Secondary | ICD-10-CM | POA: Diagnosis not present

## 2024-01-25 DIAGNOSIS — Z510 Encounter for antineoplastic radiation therapy: Secondary | ICD-10-CM | POA: Diagnosis not present

## 2024-01-25 DIAGNOSIS — C7951 Secondary malignant neoplasm of bone: Secondary | ICD-10-CM | POA: Diagnosis not present

## 2024-01-25 LAB — CBC WITH DIFFERENTIAL (CANCER CENTER ONLY)
Abs Immature Granulocytes: 0.01 10*3/uL (ref 0.00–0.07)
Basophils Absolute: 0 10*3/uL (ref 0.0–0.1)
Basophils Relative: 1 %
Eosinophils Absolute: 0 10*3/uL (ref 0.0–0.5)
Eosinophils Relative: 2 %
HCT: 27.8 % — ABNORMAL LOW (ref 39.0–52.0)
Hemoglobin: 8.8 g/dL — ABNORMAL LOW (ref 13.0–17.0)
Immature Granulocytes: 1 %
Lymphocytes Relative: 30 %
Lymphs Abs: 0.3 10*3/uL — ABNORMAL LOW (ref 0.7–4.0)
MCH: 29 pg (ref 26.0–34.0)
MCHC: 31.7 g/dL (ref 30.0–36.0)
MCV: 91.7 fL (ref 80.0–100.0)
Monocytes Absolute: 0.1 10*3/uL (ref 0.1–1.0)
Monocytes Relative: 11 %
Neutro Abs: 0.5 10*3/uL — ABNORMAL LOW (ref 1.7–7.7)
Neutrophils Relative %: 55 %
Platelet Count: 16 10*3/uL — ABNORMAL LOW (ref 150–400)
RBC: 3.03 MIL/uL — ABNORMAL LOW (ref 4.22–5.81)
RDW: 13.6 % (ref 11.5–15.5)
Smear Review: DECREASED
WBC Count: 1 10*3/uL — ABNORMAL LOW (ref 4.0–10.5)
nRBC: 0 % (ref 0.0–0.2)

## 2024-01-25 LAB — CMP (CANCER CENTER ONLY)
ALT: 14 U/L (ref 0–44)
AST: 18 U/L (ref 15–41)
Albumin: 3.6 g/dL (ref 3.5–5.0)
Alkaline Phosphatase: 89 U/L (ref 38–126)
Anion gap: 8 (ref 5–15)
BUN: 15 mg/dL (ref 8–23)
CO2: 30 mmol/L (ref 22–32)
Calcium: 8.8 mg/dL — ABNORMAL LOW (ref 8.9–10.3)
Chloride: 102 mmol/L (ref 98–111)
Creatinine: 1.16 mg/dL (ref 0.61–1.24)
GFR, Estimated: >60 mL/min (ref >60)
Glucose, Bld: 73 mg/dL (ref 70–99)
Potassium: 4.6 mmol/L (ref 3.5–5.1)
Sodium: 140 mmol/L (ref 135–145)
Total Bilirubin: 0.3 mg/dL (ref 0.0–1.2)
Total Protein: 6.3 g/dL — ABNORMAL LOW (ref 6.5–8.1)

## 2024-01-25 NOTE — Radiation Completion Notes (Addendum)
  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   Intent: Palliative     ==========DELIVERED PLANS==========  First Treatment Date: 2024-01-04 Last Treatment Date: 2024-01-22   Plan Name: Pelvis_R Site: [place], Right Technique: Isodose Plan Mode: Photon Dose Per Fraction: 2.5 Gy Prescribed Dose (Delivered / Prescribed): 22.5 Gy / 37.5 Gy Prescribed Fxs (Delivered / Prescribed): 9 / 15     ==========ON TREATMENT VISIT DATES========== 2024-01-15, 2024-01-22    See weekly On Treatment Notes in Epic for details in the Media tab (listed as Progress notes on the On Treatment Visit Dates listed above).The patient tolerated radiation. He developed fatigue and wanted to discontinue treatment early. He only completed 9 treatments.  The patient will receive a call in about one month from the radiation oncology department. He will continue follow up with Dr. Sherrod as well. We will check back with him as well to see if he's having pain in his low back and consider referral to interventional anesthesia.      Donald KYM Husband, PAC

## 2024-01-26 ENCOUNTER — Ambulatory Visit: Payer: Medicare Other

## 2024-01-27 ENCOUNTER — Ambulatory Visit: Payer: Medicare Other

## 2024-01-28 ENCOUNTER — Ambulatory Visit (INDEPENDENT_AMBULATORY_CARE_PROVIDER_SITE_OTHER): Payer: Medicare Other | Admitting: Primary Care

## 2024-01-28 ENCOUNTER — Ambulatory Visit: Payer: Medicare Other

## 2024-01-28 ENCOUNTER — Encounter: Payer: Self-pay | Admitting: Primary Care

## 2024-01-28 VITALS — BP 118/64 | HR 105 | Temp 97.5°F | Ht 69.0 in | Wt 248.0 lb

## 2024-01-28 DIAGNOSIS — I10 Essential (primary) hypertension: Secondary | ICD-10-CM

## 2024-01-28 DIAGNOSIS — Z794 Long term (current) use of insulin: Secondary | ICD-10-CM

## 2024-01-28 DIAGNOSIS — E1165 Type 2 diabetes mellitus with hyperglycemia: Secondary | ICD-10-CM

## 2024-01-28 LAB — POCT GLYCOSYLATED HEMOGLOBIN (HGB A1C): Hemoglobin A1C: 6.2 % — AB (ref 4.0–5.6)

## 2024-01-28 MED ORDER — AMLODIPINE BESYLATE 10 MG PO TABS: 5.0000 mg | ORAL_TABLET | Freq: Every day | ORAL | Status: DC

## 2024-01-28 NOTE — Patient Instructions (Signed)
Reduce the amlodipine to 5 mg daily.  Take 1/2 pill of the 10 mg tablet daily for blood pressure.  Stop glipizide for diabetes. Continue metformin and Actos.  Please watch your blood sugars and blood pressure for me.  Please schedule your general follow up  with me in 6 months.   It was a pleasure to see you today!

## 2024-01-28 NOTE — Progress Notes (Signed)
Subjective:    Patient ID: Adam Wise, male    DOB: 1951-07-18, 73 y.o.   MRN: [id number]  HPI  Adam Wise is a very pleasant 73 y.o. male with significant medical history including hypertension, OSA, CAD, lung cancer, COPD, chronic respiratory failure with hypoxia, hyperlipidemia, chronic fatigue, hypogonadism who presents today for follow-up of diabetes.  Current medications include: Pioglitazone 45 mg daily, metformin 1000 mg twice daily, glipizide 2.5 mg twice daily, Basaglar insulin 24 units daily.    He stopped his insulin months ago. He is taking glipizide 5 mg BID, not 2.5 mg BID.  He is checking his blood glucose 0 times daily.   Last A1C: 6.3 in August 2024, 6.2 today Last Eye Exam: UTD Last Foot Exam: Due Pneumonia Vaccination: 2019 Urine Microalbumin: Due Statin: pravastatin  He is not eating much at all due to lost of taste buds and decreased appetite.    BP Readings from Last 3 Encounters:  01/28/24 118/64  01/19/24 95/73  01/18/24 94/68    Wt Readings from Last 3 Encounters:  01/28/24 248 lb (112.5 kg)  01/18/24 241 lb 6.4 oz (109.5 kg)  01/14/24 241 lb 12 oz (109.7 kg)      Review of Systems  Constitutional:  Positive for fatigue.  Respiratory:  Positive for shortness of breath.   Cardiovascular:  Negative for chest pain.  Neurological:  Negative for numbness.         Past Medical History:  Diagnosis Date   Acute on chronic respiratory failure with hypoxia (HCC) 10/12/2021   Chickenpox    Chronic knee pain    Chronic low back pain    COPD exacerbation (HCC) 10/11/2021   COPD with exacerbation (HCC) 10/12/2021   Coronary artery calcification seen on CAT scan 11/2021   Coronary CTA 11/27/2021: Moderate to large right pleural effusion and compressive atelectasis right lung base. => Coronary Calcium Score 657.  Diffuse RCA calcification.  Minimal mild disease in the LAD and diagonal branches. == Overall limited study.  Notable artifact.    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 He has disease progression after the first line of chemotherapy in December 2021.   Testosterone deficiency    Type 2 diabetes mellitus (HCC)     Social History   Socioeconomic History   Marital status: Married    Spouse name: Not on file   Number of children: Not on file   Years of education: Not on file   Highest education level: 12th grade  Occupational History   Not on file  Tobacco Use   Smoking status: Former   Smokeless tobacco: Never  Vaping Use   Vaping status: Never Used  Substance and Sexual Activity   Alcohol use: Not Currently    Comment: rarely   Drug use: Not Currently   Sexual activity: Yes  Other Topics Concern   Not on file  Social History Narrative   Married.   Moved from [place].   Retired.   Social Drivers of Health   Financial Resource Strain: Low Risk  (07/27/2023)   Overall Financial Resource Strain (CARDIA)  Difficulty of Paying Living Expenses: Not hard at all  Food Insecurity: No Food Insecurity (08/18/2023)   Hunger Vital Sign    Worried About Running Out of Food in the Last Year: Never true    Ran Out of Food in the Last Year: Never true  Transportation Needs: No Transportation Needs (08/18/2023)   PRAPARE - Administrator, Civil Service (Medical): No    Lack of Transportation (Non-Medical): No  Physical Activity: Inactive (07/27/2023)   Exercise Vital Sign    Days of Exercise per Week: 0 days    Minutes of Exercise per Session: 0 min  Stress: No Stress Concern Present (07/27/2023)    [employer] of Occupational Health - Occupational Stress Questionnaire    Feeling of Stress : Only a little  Social Connections: Moderately Isolated (07/27/2023)   Social Connection and Isolation Panel [NHANES]    Frequency of Communication with Friends and Family: Once a week    Frequency of Social Gatherings with Friends and Family: Once a week    Attends Religious Services: More than 4 times per year    Active Member of [employer] or Organizations: No    Attends Banker Meetings: Never    Marital Status: Married  Catering manager Violence: Not At Risk (08/10/2023)   Humiliation, Afraid, Rape, and Kick questionnaire    Fear of Current or Ex-Partner: No    Emotionally Abused: No    Physically Abused: No    Sexually Abused: No    Past Surgical History:  Procedure Laterality Date   CHEST TUBE INSERTION Right 01/20/2022   Procedure: CHEST TUBE INSERTION;  Surgeon: Lorin Glass, MD;  Location: [facility] ENDOSCOPY;  Service: Pulmonary;  Laterality: Right;  w/ Talc Pleurodesis, planned admit for Obs afterwards   CHEST TUBE INSERTION Right 04/28/2022   Procedure: INSERTION PLEURAL DRAINAGE CATHETER - Pigtail tail, drainage;  Surgeon: Lorin Glass, MD;  Location: [facility] ENDOSCOPY;  Service: Pulmonary;  Laterality: Right;  talc pleurodesis   CHEST TUBE INSERTION Right 09/01/2022   Procedure: INSERTION PLEURAL DRAINAGE CATHETER;  Surgeon: Lupita Leash, MD;  Location: [facility] ENDOSCOPY;  Service: Cardiopulmonary;  Laterality: Right;  pigtail catheter placement with talc pleurodesis   COLONOSCOPY WITH PROPOFOL N/A 12/17/2018   Procedure: COLONOSCOPY WITH PROPOFOL;  Surgeon: Pasty Spillers, MD;  Location: [facility] ENDOSCOPY;  Service: Gastroenterology;  Laterality: N/A;   IR IMAGING GUIDED PORT INSERTION  04/17/2020   IR THORACENTESIS ASP PLEURAL SPACE W/IMG GUIDE  11/29/2021   IR THORACENTESIS ASP PLEURAL SPACE W/IMG GUIDE  01/03/2022   JOINT REPLACEMENT Bilateral    REPLACEMENT TOTAL KNEE  BILATERAL  2015   TALC PLEURODESIS  09/01/2022   Procedure: Lurlean Nanny;  Surgeon: Lupita Leash, MD;  Location: [facility] ENDOSCOPY;  Service: Cardiopulmonary;;   TONSILLECTOMY  1960   TRANSTHORACIC ECHOCARDIOGRAM  05/2020   a) 05/2020: EF 55 to 60%.  No R WMA.  Mild LVH.  Indeterminate LVEDP.  Unable to assess RVP.  Normal aortic and mitral valves.  Mildly elevated RAP.; b) 09/2021: EF 50-55%. No RWMA. Mild LVH. ~ LVEDP. Mild LA Dilation. NORMAL RV/RAP.  Normal MV/AoV.    Family History  Problem Relation Age of Onset   Heart attack Mother    Cancer Mother    Hypertension Mother    Arthritis Father    Asthma Father    Cancer Father    COPD Father    Heart attack Father    Hypertension Sister  Cancer Sister    Diabetes Sister    Asthma Sister    Cancer Sister    COPD Sister    Arthritis Sister    Asthma Sister    Diabetes Sister    Birth defects Maternal Grandfather    Arthritis Paternal Grandmother    Diabetes Paternal Grandmother    Arthritis Paternal Grandfather    Asthma Son    Other Neg Hx        pituitary disorder    Allergies  Allergen Reactions   Bupropion Other (See Comments)    Racing heart   Hydrochlorothiazide Other (See Comments)    Cramping to lower extremities    Current Outpatient Medications on File Prior to Visit  Medication Sig Dispense Refill   albuterol (VENTOLIN HFA) 108 (90 Base) MCG/ACT inhaler Inhale 2 puffs into the lungs every 4 (four) hours as needed for shortness of breath. 1 each 0   B-D 3CC LUER-LOK SYR 22GX1" 22G X 1" 3 ML MISC USE AS INSTRUCTED FOR TESTOSTERONE INJECTION EVERY 2 WEEKS 10 each 2   Black Pepper-Turmeric (TURMERIC COMPLEX/BLACK PEPPER PO) Take 1 tablet by mouth in the morning and at bedtime.     Coenzyme Q10 (COQ10) 200 MG CAPS Take 200 mg by mouth at bedtime.     cyclobenzaprine (FLEXERIL) 5 MG tablet TAKE 1 TABLET BY MOUTH AT BEDTIME AS NEEDED FOR MUSCLE SPASMS. 90 tablet 0   ELIQUIS 5 MG TABS tablet TAKE 1 TABLET  BY MOUTH TWICE A DAY 180 tablet 2   esomeprazole (NEXIUM) 20 MG capsule Take 20 mg by mouth in the morning.     gabapentin (NEURONTIN) 300 MG capsule TAKE 1 CAPSULE (300 MG TOTAL) BY MOUTH 2 (TWO) TIMES DAILY. FOR BACK PAIN. 180 capsule 1   glucose blood (ONETOUCH ULTRA) test strip USE UP TO 4 TIMES DAILY AS DIRECTED 400 strip 5   Insulin Pen Needle (BD PEN NEEDLE NANO U/F) 32G X 4 MM MISC Use with insulin as prescribed Dx Code: E11.9 100 each 3   ipratropium-albuterol (DUONEB) 0.5-2.5 (3) MG/3ML SOLN Take 3 mLs by nebulization every 4 (four) hours while awake for 3 days, THEN 3 mLs every 4 (four) hours as needed (shortness of breath or wheezing). 360 mL 0   KRILL OIL PO Take 1 capsule by mouth daily.     Lancets MISC USE UP TO 3 TIMES DAILY AS DIRECTED 100 each 2   lidocaine (LIDODERM) 5 % Place 1 patch onto the skin daily. Remove & Discard patch within 12 hours or as directed by MD 30 patch 0   losartan (COZAAR) 50 MG tablet TAKE 1 TABLET (50 MG TOTAL) BY MOUTH DAILY. FOR BLOOD PRESSURE. 90 tablet 3   metFORMIN (GLUCOPHAGE) 1000 MG tablet TAKE 1 TABLET (1,000 MG TOTAL) BY MOUTH 2 (TWO) TIMES DAILY WITH A MEAL. FOR DIABETES 180 tablet 0   morphine (MS CONTIN) 30 MG 12 hr tablet Take 1 tablet (30 mg total) by mouth 2 (two) times daily. 60 tablet 0   Multiple Vitamin (MULTI-VITAMINS) TABS Take 1 tablet by mouth daily with breakfast.     Naloxone HCl 3 MG/0.1ML LIQD Place 1 spray into both nostrils once. For known/suspected opiod overdose. Every 2-3 minutes in alternating nostril till EMS arrives.     ondansetron (ZOFRAN) 4 MG tablet Take 1 tablet (4 mg total) by mouth every 8 (eight) hours as needed for nausea or vomiting. 30 tablet 0   oxyCODONE-acetaminophen (PERCOCET) 10-325 MG tablet Take 1  tablet by mouth every 4 (four) hours as needed.     pioglitazone (ACTOS) 45 MG tablet TAKE 1 TABLET (45 MG TOTAL) BY MOUTH DAILY. FOR DIABETES. 90 tablet 1   pravastatin (PRAVACHOL) 40 MG tablet TAKE 1 TABLET  BY MOUTH EVERY DAY IN THE EVENING FOR CHOLESTEROL 90 tablet 3   PRESCRIPTION MEDICATION CPAP- At bedtime     testosterone cypionate (DEPOTESTOSTERONE CYPIONATE) 200 MG/ML injection Inject 200 mg into the muscle every 14 (fourteen) days.     docusate sodium (COLACE) 100 MG capsule Take 1 capsule (100 mg total) by mouth every 12 (twelve) hours. (Patient not taking: Reported on 01/28/2024) 60 capsule 0   Melatonin 10 MG CAPS Take 10 mg by mouth at bedtime as needed (for sleep). (Patient not taking: Reported on 01/28/2024)     No current facility-administered medications on file prior to visit.    BP 118/64   Pulse (!) 105   Temp (!) 97.5 F (36.4 C) (Temporal)   Ht 5\' 9"  (1.753 m)   Wt 248 lb (112.5 kg)   SpO2 97%   BMI 36.62 kg/m  Objective:   Physical Exam Cardiovascular:     Rate and Rhythm: Normal rate and regular rhythm.  Pulmonary:     Effort: Pulmonary effort is normal.     Breath sounds: Normal breath sounds.  Musculoskeletal:     Cervical back: Neck supple.  Skin:    General: Skin is warm and dry.  Neurological:     Mental Status: He is alert and oriented to person, place, and time.  Psychiatric:        Mood and Affect: Mood normal.           Assessment & Plan:  Type 2 diabetes mellitus with hyperglycemia, with long-term current use of insulin (HCC) Assessment & Plan: Well controlled with A1C of 6.2 today.  Given his lack of appetite, coupled with lower glucose readings recently, will discontinue glipizide 5 mg BID.  Continue metformin 1000 mg twice daily, Actos 45 mg daily. Remain off insulin for now as he is no longer on steroid treatment with cancer treatment.  He will watch glucose levels at home.  Foot exam today.  Follow-up in 6 months.  Orders: -     POCT glycosylated hemoglobin (Hb A1C)  Essential hypertension Assessment & Plan: Hypotensive recently.  Reduce amlodipine to 5 mg daily. Continue losartan 50 mg daily.  They will update  regarding blood pressure readings.  Plan will be to discontinue amlodipine completely if needed.  Orders: -     amLODIPine Besylate; Take 0.5 tablets (5 mg total) by mouth daily. TAKE 1 TABLET BY MOUTH EVERY DAY for blood pressure.        Doreene Nest, NP

## 2024-01-28 NOTE — Progress Notes (Signed)
   Established Patient Office Visit  Subjective   Patient ID: Adam Wise, male    DOB: 24-Jun-1951  Age: 73 y.o. MRN: [id number]  Chief Complaint  Patient presents with   Medical Management of Chronic Issues    6 mo DM f.u    HPI  Adam Wise is a 73 year old male with a medical history of hypertension, OSA, small cell lung cancer to the right lobe, right recurrent pleural effusion, chronic respiratory failure with hypoxia, type 2 diabetes, hyperlipidemia, hypogonadism, chronic back pain, chronic fatigue, elevated coronary calcium score who presents today for follow-up of diabetes  Current medications: Insulin glargine (Basaglar) 24U QHS - has not take in months Metformin 1000mg  BID Pioglitazone 45mg  every day Glipizide 5mg  BID  Denies episodes of hypoglycemia  Decreased appetite d/t decreased taste  Has had episodes of dehydration - last 01/04/24 with ED visit to [facility]  Last A1c: 6.3% (07/28/23) - today 6.2% Last eye exam: due, last several years ago Last foot exam: due Pneumonia vaccine: completed 2019 Flu vaccine: due today Urine microalbumin: due Statin: pravastatin 40mg  daily  Daily blood sugars - does not check  Diet currently consists of:  Breakfast: V8 tomato juice, english muffin  Lunch: ramen noodles Dinner: chicken, beef  Snacks: pretzels, cheesy popcorn Desserts: none Beverages: water, Powerade, Fanta Orange soda on occasion  Exercise: none  Polyuria - none Polydipsia - none Polyphagia - none Numbness/tingling to feet - none   Wt Readings from Last 3 Encounters:  01/28/24 112.5 kg  01/18/24 109.5 kg  01/14/24 109.7 kg   Would like to discuss BP meds Amlodipine 10mg  daily Losartan 50mg  daily Symptomatic hypotension - lightheaded/dizziness  BP Readings from Last 3 Encounters:  01/28/24 118/64  01/19/24 95/73  01/18/24 94/68       ROS    Objective:     BP 118/64   Pulse (!) 105   Temp (!) 97.5 F (36.4 C) (Temporal)   Ht  5\' 9"  (1.753 m)   Wt 112.5 kg   SpO2 97%   BMI 36.62 kg/m    Physical Exam   Results for orders placed or performed in visit on 01/28/24  POCT glycosylated hemoglobin (Hb A1C)  Result Value Ref Range   Hemoglobin A1C 6.2 (A) 4.0 - 5.6 %   HbA1c POC (<> result, manual entry)     HbA1c, POC (prediabetic range)     HbA1c, POC (controlled diabetic range)        The ASCVD Risk score (Arnett DK, et al., 2019) failed to calculate for the following reasons:   The valid total cholesterol range is 130 to 320 mg/dL    Assessment & Plan:   Problem List Items Addressed This Visit       Endocrine   Type 2 diabetes mellitus (HCC) - Primary (Chronic)   Relevant Orders   POCT glycosylated hemoglobin (Hb A1C) (Completed)    No follow-ups on file.    Lindell Spar, RN

## 2024-01-28 NOTE — Assessment & Plan Note (Signed)
Hypotensive recently.  Reduce amlodipine to 5 mg daily. Continue losartan 50 mg daily.  They will update regarding blood pressure readings.  Plan will be to discontinue amlodipine completely if needed.

## 2024-01-28 NOTE — Assessment & Plan Note (Signed)
Well controlled with A1C of 6.2 today.  Given his lack of appetite, coupled with lower glucose readings recently, will discontinue glipizide 5 mg BID.  Continue metformin 1000 mg twice daily, Actos 45 mg daily. Remain off insulin for now as he is no longer on steroid treatment with cancer treatment.  He will watch glucose levels at home.  Foot exam today.  Follow-up in 6 months.

## 2024-01-29 ENCOUNTER — Ambulatory Visit: Payer: Medicare Other

## 2024-01-29 NOTE — Progress Notes (Signed)
Palliative Medicine [facility]  [phone] [phone]   Name: Adam Wise Date: 01/29/2024 MRN: [id number]  DOB: 1951/08/29  Patient Care Team: Doreene Nest, NP as PCP - General (Internal Medicine) Marykay Lex, MD as PCP - Cardiology (Cardiology) Syliva Overman, RN as Oncology Nurse Navigator Pickenpack-Cousar, Arty Baumgartner, NP as Nurse Practitioner [facility] and Palliative Medicine)    INTERVAL HISTORY: Adam Wise is a 73 y.o. 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 Palliative ask to see for symptom management and goals of care.   SOCIAL HISTORY:     reports that he has quit smoking. He has never used smokeless tobacco. He reports that he does not currently use alcohol. He reports that he does not currently use drugs.  ADVANCE DIRECTIVES:  Advanced directives on file naming Rivaldo Hineman, Chryl Heck, Tillie Rung, and Duanne Guess as decision makers for this pt should Mr.Winslow become unable to speak for himself.   CODE STATUS: Full code  PAST MEDICAL HISTORY: Past Medical History:  Diagnosis Date   Acute on chronic respiratory failure with hypoxia (HCC) 10/12/2021   Chickenpox    Chronic knee pain    Chronic low back pain    COPD exacerbation (HCC) 10/11/2021   COPD with exacerbation (HCC) 10/12/2021   Coronary artery calcification seen on CAT scan 11/2021   Coronary CTA 11/27/2021: Moderate to large right pleural effusion and compressive atelectasis right lung base. => Coronary Calcium Score 657.  Diffuse RCA calcification.  Minimal mild disease in the LAD and diagonal branches. == Overall limited study.  Notable artifact.   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 He has disease progression after the first line of chemotherapy in December 2021.   Testosterone deficiency    Type 2 diabetes mellitus (HCC)     ALLERGIES:  is allergic to bupropion and hydrochlorothiazide.  MEDICATIONS:  Current Outpatient Medications  Medication Sig Dispense Refill   albuterol (VENTOLIN HFA) 108 (90 Base) MCG/ACT inhaler Inhale 2 puffs into the lungs every 4 (four) hours as needed for shortness of breath. 1 each 0   amLODipine (NORVASC) 10 MG tablet Take 0.5 tablets (5 mg total) by mouth daily. TAKE 1 TABLET BY MOUTH EVERY DAY for blood pressure.     B-D 3CC LUER-LOK SYR 22GX1" 22G X 1" 3 ML MISC USE AS INSTRUCTED FOR TESTOSTERONE INJECTION EVERY 2 WEEKS 10 each 2   Black Pepper-Turmeric (TURMERIC COMPLEX/BLACK PEPPER PO) Take 1 tablet by mouth in the morning and at bedtime.     Coenzyme Q10 (COQ10) 200 MG CAPS Take 200 mg by mouth at bedtime.     cyclobenzaprine (FLEXERIL) 5 MG tablet TAKE 1 TABLET BY MOUTH AT BEDTIME AS NEEDED FOR MUSCLE SPASMS. 90 tablet 0   docusate sodium (COLACE) 100 MG capsule Take 1 capsule (100 mg total) by mouth every 12 (twelve) hours. (Patient not taking: Reported on 01/28/2024) 60 capsule 0   ELIQUIS 5 MG TABS tablet TAKE 1 TABLET BY MOUTH TWICE  A DAY 180 tablet 2   esomeprazole (NEXIUM) 20 MG capsule Take 20 mg by mouth in the morning.     gabapentin (NEURONTIN) 300 MG capsule TAKE 1 CAPSULE (300 MG TOTAL) BY MOUTH 2 (TWO) TIMES DAILY. FOR BACK PAIN. 180 capsule 1   glucose blood (ONETOUCH ULTRA) test strip USE UP TO 4 TIMES DAILY AS DIRECTED 400 strip 5   Insulin Pen Needle (BD PEN NEEDLE NANO U/F) 32G X 4 MM MISC Use with insulin as prescribed Dx Code: E11.9 100 each 3   ipratropium-albuterol  (DUONEB) 0.5-2.5 (3) MG/3ML SOLN Take 3 mLs by nebulization every 4 (four) hours while awake for 3 days, THEN 3 mLs every 4 (four) hours as needed (shortness of breath or wheezing). 360 mL 0   KRILL OIL PO Take 1 capsule by mouth daily.     Lancets MISC USE UP TO 3 TIMES DAILY AS DIRECTED 100 each 2   lidocaine (LIDODERM) 5 % Place 1 patch onto the skin daily. Remove & Discard patch within 12 hours or as directed by MD 30 patch 0   losartan (COZAAR) 50 MG tablet TAKE 1 TABLET (50 MG TOTAL) BY MOUTH DAILY. FOR BLOOD PRESSURE. 90 tablet 3   Melatonin 10 MG CAPS Take 10 mg by mouth at bedtime as needed (for sleep). (Patient not taking: Reported on 01/28/2024)     metFORMIN (GLUCOPHAGE) 1000 MG tablet TAKE 1 TABLET (1,000 MG TOTAL) BY MOUTH 2 (TWO) TIMES DAILY WITH A MEAL. FOR DIABETES 180 tablet 1   morphine (MS CONTIN) 30 MG 12 hr tablet Take 1 tablet (30 mg total) by mouth 2 (two) times daily. 60 tablet 0   Multiple Vitamin (MULTI-VITAMINS) TABS Take 1 tablet by mouth daily with breakfast.     Naloxone HCl 3 MG/0.1ML LIQD Place 1 spray into both nostrils once. For known/suspected opiod overdose. Every 2-3 minutes in alternating nostril till EMS arrives.     ondansetron (ZOFRAN) 4 MG tablet Take 1 tablet (4 mg total) by mouth every 8 (eight) hours as needed for nausea or vomiting. 30 tablet 0   oxyCODONE-acetaminophen (PERCOCET) 10-325 MG tablet Take 1 tablet by mouth every 4 (four) hours as needed.     pioglitazone (ACTOS) 45 MG tablet TAKE 1 TABLET (45 MG TOTAL) BY MOUTH DAILY. FOR DIABETES. 90 tablet 1   pravastatin (PRAVACHOL) 40 MG tablet TAKE 1 TABLET BY MOUTH EVERY DAY IN THE EVENING FOR CHOLESTEROL 90 tablet 3   PRESCRIPTION MEDICATION CPAP- At bedtime     testosterone cypionate (DEPOTESTOSTERONE CYPIONATE) 200 MG/ML injection Inject 200 mg into the muscle every 14 (fourteen) days.     No current facility-administered medications for this visit.    VITAL SIGNS: There were no vitals taken  for this visit. There were no vitals filed for this visit.  Estimated body mass index is 36.62 kg/m as calculated from the following:   Height as of 01/28/24: 5\' 9"  (1.753 m).   Weight as of 01/28/24: 248 lb (112.5 kg).   PERFORMANCE STATUS (ECOG) : 1 - Symptomatic but completely ambulatory   Physical Exam General: NAD Cardiovascular: regular rate and rhythm Pulmonary: normal breathing pattern Extremities: no edema, no joint deformities Skin: no rashes Neurological: AAO x3  IMPRESSION:  Mr. Brailen Macneal "Adam Wise" presents to clinic for follow-up. His wife is present. Patient in wheelchair. Reports ongoing fatigue. Denies nausea, vomiting, constipation, or diarrhea. Sleeping more often. He experiences fatigue and weakness, which he attributes to decrease hemoglobin. He was  seen by Cassie, PA. Due to lab findings his treatment is being held today with plans for a blood transfusion.   The patient reports his appetite is poor, and he has experienced gradual weight loss over time. His weight has fluctuated, with weight loss over past 2-3 months of almost 15lbs. Adam Wise expresses change to his taste that contributes to his decrease in appetite. He has been instructed to use salt rinses and Biotene to help with oral changes and dryness. He can also try ginger to assist with taste bud changes. Due to ongoing weight loss and decreased appetite Cassie, PA has prescribed mirtazapine with hopes of improving his appetite. Re-enforced education on use.   He has a history of low blood pressure, leading to a reduction in his amlodipine dosage per his PCP. His blood pressure readings have been low, with a recent measurement of 94/66. He monitors his blood pressure daily.  Mr. Burbach reports his back pain is well controlled on current regimen. He takes morphine er twice a day without difficulty. Is not requiring oxycodone for breakthrough pain which he is appreciative of. He is considering reducing the morphine to  once a day to see if the pain remains manageable. I discussed how to safely wean, with awareness if pain worsens or unable to tolerate to resume regular dosing. He and wife verbalized understanding   We will continue to closely monitor. All questions answered and support. provided.  We discussed Her current illness and what it means in the larger context of Her on-going co-morbidities. Natural disease trajectory and expectations were discussed.  I discussed the importance of continued conversation with family and their medical providers regarding overall plan of care and treatment options, ensuring decisions are within the context of the patients values and GOCs.  Assessment and Plan  Anemia Patient is tired and weak, likely secondary to anemia. Blood transfusion is planned per oncology. Treatment held.  -Administer blood transfusion today.  Hypertension Blood pressure has been low. Amlodipine dose was recently reduced by primary care provider. -Monitor blood pressure daily for two weeks and report to primary care provider.  Chronic Cancer Related Pain Back pain has improved. Currently on Morphine twice daily. -Consider reducing Morphine to once daily at bedtime and monitor for any increase in pain.  Weight Loss Gradual weight loss over time, likely secondary to decreased appetite and altered taste. -Start Remeron to stimulate appetite per Cassie, PA -Start Biotene and salt rinses to improve taste. -Monitor weight closely.  General Health Maintenance -Plan for imaging scans after three cycles of current treatment -Return for treatment next week and recheck labs per Oncology.  -I will plan to see patient back in 3-4 weeks. Sooner if needed.  Patient expressed understanding and was in agreement with this plan. He also understands that He can call the clinic at any time with any questions, concerns, or complaints.   Any controlled substances utilized were prescribed in the context of  palliative care. PDMP has been reviewed.   Visit consisted of counseling and education dealing with the complex and emotionally intense issues of symptom management and palliative care in the setting of serious and potentially life-threatening illness.  Willette Alma, AGPCNP-BC  Palliative Medicine Team/[facility]

## 2024-01-30 NOTE — Progress Notes (Unsigned)
[facility] OFFICE PROGRESS NOTE  Doreene Nest, NP [address] [place] [address]  DIAGNOSIS: Relapsed extensive stage (T4, N3, M1c)  small cell lung cancer diagnosed in April 2021 and presented with large right upper lobe/right hilar mass with ipsilateral and contralateral mediastinal and right supraclavicular lymphadenopathy in addition to multiple liver lesions. He was initially diagnosed in December 2021.   PRIOR THERAPY: 1) Palliative radiotherapy to the right upper lobe lung mass under the care of Dr. Mitzi Hansen. 2) Systemic chemotherapy with carboplatin for AUC of 5 on day 1, etoposide 100 mg/M2 on days 1, 2 and 3 in addition to Imfinzi 1500 mg IV every 3 weeks with chemotherapy then every 4 weeks for maintenance if the patient has no evidence for progression.  He will also receive Cosela 240 mg/m2 on the days of the chemotherapy.  Status post 9 cycles.  Starting from cycle #5 the patient will be on maintenance treatment with immunotherapy with Imfinzi 1500 mg IV every 4 weeks. Last dose of chemotherapy was given on November 13, 2020. This treatment was discontinued secondary to disease progression 3) Systemic chemotherapy with carboplatin for AUC of 5 on day 1, etoposide 100 mg/M2 on days 1, 2 and 3, Tecentriq 1200 mg IV every 3 weeks as well as Cosela 250 mg/M2 on the days of the chemotherapy every 3 weeks.  First dose December 18, 2020.  Status post 8 cycles. 4) Zepzelca (lurbinectedin) 3.2 mgm2 IV every 3 weeks. Last dose on 01/23/22. Status post 12 cycles.  5) Palliative systemic chemotherapy with irinotecan 65 mg/m2 on days 1 and 8 IV every 3 weeks.  Status post 3 cycles.  Last dose was given April 01, 2022 discontinued secondary to disease progression. 6) SBRT to the progressive liver lesions under the care of Dr. Mitzi Hansen.  Last fraction January 27, 2023. 7) systemic chemotherapy with carboplatin for AUC of 5 on day 1, etoposide 100 Mg/M2 on days 1, 2 and 3 with Cosela  before the chemotherapy.  First dose expected to start on 05/28/2022.  Status post 15 cycles.  Starting from cycle #5 his carboplatin will be reduced to AUC of 4 and 2 etoposide 80 Mg/M2.  Last dose was giving April 07, 2023 discontinued for suspicious disease progression. 8) tarlatamab (6/17-12/29) at [name]) 2) palliative radiation to the right hip under the care of Dr. Mitzi Hansen, plan to occur in January 2025  CURRENT THERAPY:  1) Topotecan 1.2 mg/M2 on days 1-5 every 3 weeks with Cosela 240 Mg/M2 IV before the chemotherapy.  First dose January 11, 2024. Status post 1 cycle.   INTERVAL HISTORY: Adam Wise 73 y.o. male returns to the clinic today for a follow-up visit accompanied by his wife. In summary, the patient has extensive stage small cell lung cancer. He was undergoing treatment at [facility] but was recently found to have disease progression in December 2024. Therefore, he is starting treatment again locally here at the [facility] with topotecan days 1 through 5 IV every 3 weeks.  He is status post 1 cycle and tolerated it well except for cytopenias.  The patient had neutropenia last week.  He denies any signs or symptoms of infection including fever, chills, nasal congestion, sore throat, unusual cough, skin infections, diarrhea, or dysuria.  He also had thrombocytopenia.  The patient is on a blood thinner.  He did have some nosebleeds when he had thrombocytopenia. He also struggles with anemia. He does report fatigue.  He also underwent palliative radiation to the right hip.  He did complete his radiation course a few days early.  He has noticed an improvement in his pain but he is not sure if this is attributed to radiation or not.  He is generally not requiring his breakthrough oxycodone.  He is continuing to take morphine twice a day but he is thinking about trying to only take it once a day and see if his pain is managed.  They did see their PCP recently about an appetite stimulant.   They are wondering what our recommendation would be regarding this.  He has lost a lot of weight over the course of 7 months or so but overall his weight is stable since he was last seen.  They also reduce the dose of his amlodipine due to hypotension.  They are can to keep a log of the readings and share the readings with the PCP and they are to determine if this should be discontinued altogether.  He denies any fever, chills, or night sweats.  For breathing, his shortness of breath is still present but "not as bad as before".  He denies any hemoptysis or chest pain.  Denies any nausea, vomiting, diarrhea, or constipation.  He denies any rashes or skin changes. Denies any headache or visual changes.  He is here today for evaluation repeat blood work before undergoing cycle #2.   MEDICAL HISTORY: Past Medical History:  Diagnosis Date   Acute on chronic respiratory failure with hypoxia (HCC) 10/12/2021   Chickenpox    Chronic knee pain    Chronic low back pain    COPD exacerbation (HCC) 10/11/2021   COPD with exacerbation (HCC) 10/12/2021   Coronary artery calcification seen on CAT scan 11/2021   Coronary CTA 11/27/2021: Moderate to large right pleural effusion and compressive atelectasis right lung base. => Coronary Calcium Score 657.  Diffuse RCA calcification.  Minimal mild disease in the LAD and diagonal branches. == Overall limited study.  Notable artifact.   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 He has disease progression after the first line of chemotherapy in December 2021.    Testosterone deficiency    Type 2 diabetes mellitus (HCC)     ALLERGIES:  is allergic to bupropion and hydrochlorothiazide.  MEDICATIONS:  Current Outpatient Medications  Medication Sig Dispense Refill   albuterol (VENTOLIN HFA) 108 (90 Base) MCG/ACT inhaler Inhale 2 puffs into the lungs every 4 (four) hours as needed for shortness of breath. 1 each 0   amLODipine (NORVASC) 10 MG tablet Take 0.5 tablets (5 mg total) by mouth daily. TAKE 1 TABLET BY MOUTH EVERY DAY for blood pressure.     B-D 3CC LUER-LOK SYR 22GX1" 22G X 1" 3 ML MISC USE AS INSTRUCTED FOR TESTOSTERONE INJECTION EVERY 2 WEEKS 10 each 2   Black Pepper-Turmeric (TURMERIC COMPLEX/BLACK PEPPER PO) Take 1 tablet by mouth in the morning and at bedtime.     Coenzyme Q10 (COQ10) 200 MG CAPS Take 200 mg by mouth at bedtime.     cyclobenzaprine (FLEXERIL) 5 MG tablet TAKE 1 TABLET  BY MOUTH AT BEDTIME AS NEEDED FOR MUSCLE SPASMS. 90 tablet 0   docusate sodium (COLACE) 100 MG capsule Take 1 capsule (100 mg total) by mouth every 12 (twelve) hours. (Patient not taking: Reported on 01/28/2024) 60 capsule 0   ELIQUIS 5 MG TABS tablet TAKE 1 TABLET BY MOUTH TWICE A DAY 180 tablet 2   esomeprazole (NEXIUM) 20 MG capsule Take 20 mg by mouth in the morning.     gabapentin (NEURONTIN) 300 MG capsule TAKE 1 CAPSULE (300 MG TOTAL) BY MOUTH 2 (TWO) TIMES DAILY. FOR BACK PAIN. 180 capsule 1   glucose blood (ONETOUCH ULTRA) test strip USE UP TO 4 TIMES DAILY AS DIRECTED 400 strip 5   Insulin Pen Needle (BD PEN NEEDLE NANO U/F) 32G X 4 MM MISC Use with insulin as prescribed Dx Code: E11.9 100 each 3   ipratropium-albuterol (DUONEB) 0.5-2.5 (3) MG/3ML SOLN Take 3 mLs by nebulization every 4 (four) hours while awake for 3 days, THEN 3 mLs every 4 (four) hours as needed (shortness of breath or wheezing). 360 mL 0   KRILL OIL PO Take 1 capsule by mouth daily.     Lancets MISC USE UP TO 3 TIMES DAILY AS DIRECTED 100 each 2   lidocaine (LIDODERM) 5 % Place 1  patch onto the skin daily. Remove & Discard patch within 12 hours or as directed by MD 30 patch 0   losartan (COZAAR) 50 MG tablet TAKE 1 TABLET (50 MG TOTAL) BY MOUTH DAILY. FOR BLOOD PRESSURE. 90 tablet 3   Melatonin 10 MG CAPS Take 10 mg by mouth at bedtime as needed (for sleep). (Patient not taking: Reported on 01/28/2024)     metFORMIN (GLUCOPHAGE) 1000 MG tablet TAKE 1 TABLET (1,000 MG TOTAL) BY MOUTH 2 (TWO) TIMES DAILY WITH A MEAL. FOR DIABETES 180 tablet 1   morphine (MS CONTIN) 30 MG 12 hr tablet Take 1 tablet (30 mg total) by mouth 2 (two) times daily. 60 tablet 0   Multiple Vitamin (MULTI-VITAMINS) TABS Take 1 tablet by mouth daily with breakfast.     Naloxone HCl 3 MG/0.1ML LIQD Place 1 spray into both nostrils once. For known/suspected opiod overdose. Every 2-3 minutes in alternating nostril till EMS arrives.     ondansetron (ZOFRAN) 4 MG tablet Take 1 tablet (4 mg total) by mouth every 8 (eight) hours as needed for nausea or vomiting. 30 tablet 0   oxyCODONE-acetaminophen (PERCOCET) 10-325 MG tablet Take 1 tablet by mouth every 4 (four) hours as needed.     pioglitazone (ACTOS) 45 MG tablet TAKE 1 TABLET (45 MG TOTAL) BY MOUTH DAILY. FOR DIABETES. 90 tablet 1   pravastatin (PRAVACHOL) 40 MG tablet TAKE 1 TABLET BY MOUTH EVERY DAY IN THE EVENING FOR CHOLESTEROL 90 tablet 3   PRESCRIPTION MEDICATION CPAP- At bedtime     testosterone cypionate (DEPOTESTOSTERONE CYPIONATE) 200 MG/ML injection Inject 200 mg into the muscle every 14 (fourteen) days.     No current facility-administered medications for this visit.    SURGICAL HISTORY:  Past Surgical History:  Procedure Laterality Date   CHEST TUBE INSERTION Right 01/20/2022   Procedure: CHEST TUBE INSERTION;  Surgeon: Lorin Glass, MD;  Location: [facility] ENDOSCOPY;  Service: Pulmonary;  Laterality: Right;  w/ Talc Pleurodesis, planned admit for Obs afterwards   CHEST TUBE INSERTION Right 04/28/2022   Procedure: INSERTION PLEURAL DRAINAGE  CATHETER - Pigtail tail, drainage;  Surgeon: Lorin Glass, MD;  Location: [facility] ENDOSCOPY;  Service: Pulmonary;  Laterality: Right;  talc pleurodesis   CHEST TUBE INSERTION Right 09/01/2022   Procedure: INSERTION PLEURAL DRAINAGE CATHETER;  Surgeon: Lupita Leash, MD;  Location: [facility] ENDOSCOPY;  Service: Cardiopulmonary;  Laterality: Right;  pigtail catheter placement with talc pleurodesis   COLONOSCOPY WITH PROPOFOL N/A 12/17/2018   Procedure: COLONOSCOPY WITH PROPOFOL;  Surgeon: Pasty Spillers, MD;  Location: [facility] ENDOSCOPY;  Service: Gastroenterology;  Laterality: N/A;   IR IMAGING GUIDED PORT INSERTION  04/17/2020   IR THORACENTESIS ASP PLEURAL SPACE W/IMG GUIDE  11/29/2021   IR THORACENTESIS ASP PLEURAL SPACE W/IMG GUIDE  01/03/2022   JOINT REPLACEMENT Bilateral    REPLACEMENT TOTAL KNEE BILATERAL  2015   TALC PLEURODESIS  09/01/2022   Procedure: Lurlean Nanny;  Surgeon: Lupita Leash, MD;  Location: [facility] ENDOSCOPY;  Service: Cardiopulmonary;;   TONSILLECTOMY  1960   TRANSTHORACIC ECHOCARDIOGRAM  05/2020   a) 05/2020: EF 55 to 60%.  No R WMA.  Mild LVH.  Indeterminate LVEDP.  Unable to assess RVP.  Normal aortic and mitral valves.  Mildly elevated RAP.; b) 09/2021: EF 50-55%. No RWMA. Mild LVH. ~ LVEDP. Mild LA Dilation. NORMAL RV/RAP.  Normal MV/AoV.    REVIEW OF SYSTEMS:   Review of Systems  Constitutional: Positive for fatigue and decreased appetite secondary to taste alterations. Weight loss over 7 months. negative for chills and fever.  HENT: positive for epistaxis last week. Negative for mouth sores, sore throat and trouble swallowing.   Eyes: Negative for eye problems and icterus.  Respiratory: Positive for stable shortness of breath. Negative for hemoptysis and wheezing.   Cardiovascular: Negative for chest pain and leg swelling.  Gastrointestinal: Negative for abdominal pain, constipation, diarrhea, nausea and vomiting.  Genitourinary: Negative for bladder  incontinence, difficulty urinating, dysuria, frequency and hematuria.   Musculoskeletal: Improved back pain. Negative for gait problem, neck pain and neck stiffness.  Skin: Negative for itching and rash.  Neurological: Negative for dizziness, extremity weakness, gait problem, headaches, light-headedness and seizures.  Hematological: Negative for adenopathy. Does not bruise/bleed easily.  Psychiatric/Behavioral: Negative for confusion, depression and sleep disturbance. The patient is not nervous/anxious.     PHYSICAL EXAMINATION:  There were no vitals taken for this visit.  ECOG PERFORMANCE STATUS: 2  Physical Exam  Constitutional: Oriented to person, place, and time and well-developed, well-nourished, and in no distress.  HENT:  Head: Normocephalic and atraumatic.  Mouth/Throat: Oropharynx is clear and moist. No oropharyngeal exudate.  Eyes: Conjunctivae are normal. Right eye exhibits no discharge. Left eye exhibits no discharge. No scleral icterus.  Neck: Normal range of motion. Neck supple.  Cardiovascular: Normal rate, regular rhythm, normal heart sounds and intact distal pulses.   Pulmonary/Chest: Effort normal. Quiet breath sounds in the right lung. No respiratory distress. No wheezes. No rales.  Abdominal: Soft. Bowel sounds are normal. Exhibits no distension and no mass. There is no tenderness.  Musculoskeletal: Normal range of motion. Exhibits no edema.  Lymphadenopathy:    No cervical adenopathy.  Neurological: Alert and oriented to person, place, and time. Exhibits normal muscle tone. Examined in the wheelchair.  Skin: Skin is warm and dry. No rash noted. Not diaphoretic. No erythema. No pallor.  Psychiatric: Mood, memory and judgment normal.  Vitals reviewed.  LABORATORY DATA: Lab Results  Component Value Date   WBC 1.0 (L) 01/25/2024   HGB 8.8 (L) 01/25/2024   HCT 27.8 (L) 01/25/2024   MCV 91.7 01/25/2024   PLT 16 (L) 01/25/2024      Chemistry  Component  Value Date/Time   NA 140 01/25/2024 0845   K 4.6 01/25/2024 0845   CL 102 01/25/2024 0845   CO2 30 01/25/2024 0845   BUN 15 01/25/2024 0845   CREATININE 1.16 01/25/2024 0845   CREATININE 1.45 (H) 10/18/2021 1533      Component Value Date/Time   CALCIUM 8.8 (L) 01/25/2024 0845   ALKPHOS 89 01/25/2024 0845   AST 18 01/25/2024 0845   ALT 14 01/25/2024 0845   BILITOT 0.3 01/25/2024 0845       RADIOGRAPHIC STUDIES:  DG Chest Port 1 View Result Date: 01/04/2024 CLINICAL DATA:  Wheezing and productive cough. Known small-cell lung cancer. EXAM: PORTABLE CHEST 1 VIEW COMPARISON:  X-ray 08/10/2023. FINDINGS: Stable right IJ chest port. Enlarged cardiopericardial silhouette. Perihilar opacity seen along the right hemithorax with volume loss and pleural thickening. Appearance is similar to previous. Small right effusion. Stable appearance to the clear left lung. No left-sided pneumothorax or effusion. Overlapping cardiac leads. Film is under penetrated. IMPRESSION: No significant interval change. Opacity along the right perihilar region with volume loss of the right lung. Pleural thickening. Chest port. Small right effusion. Electronically Signed   By: Karen Kays M.D.   On: 01/04/2024 11:49   CT Head Wo Contrast Result Date: 01/04/2024 CLINICAL DATA:  mental status change, known small cell carcinoma EXAM: CT HEAD WITHOUT CONTRAST TECHNIQUE: Contiguous axial images were obtained from the base of the skull through the vertex without intravenous contrast. RADIATION DOSE REDUCTION: This exam was performed according to the departmental dose-optimization program which includes automated exposure control, adjustment of the mA and/or kV according to patient size and/or use of iterative reconstruction technique. COMPARISON:  MR Head 02/06/23 FINDINGS: Brain: No hemorrhage. No hydrocephalus. No extra-axial fluid collection. No CT evidence of an acute cortical infarct. No mass effect. No mass lesion. Vascular: No  hyperdense vessel or unexpected calcification. Skull: Normal. Negative for fracture or focal lesion. Sinuses/Orbits: No middle ear or mastoid effusion. Air-fluid level in the right maxillary sinus, which can be seen in the setting of acute sinusitis. Orbits are unremarkable. Other: None. IMPRESSION: 1. No acute intracranial abnormality. 2. Air-fluid level in the right maxillary sinus, which can be seen in the setting of acute sinusitis. Electronically Signed   By: Lorenza Cambridge M.D.   On: 01/04/2024 11:18     ASSESSMENT/PLAN:  This is a very pleasant 73 year old Caucasian male diagnosed with extensive stage (T4, N3, M1C) small cell lung cancer presented with large right upper lobe lung mass in addition to mediastinal and right supraclavicular lymphadenopathy and multiple metastatic liver lesions diagnosed in April 2021.    The patient initially underwent systemic chemotherapy with carboplatin for an AUC of 5 on day 1, etoposide 100 mg per metered squared on days 1, 2, and 3 in addition to [facility] for myeloprotection.  He also received immunotherapy with Imfinzi on day 1 of every chemotherapy cycle.  He is status post 8 cycles.  Starting from cycle #5 he was on single agent immunotherapy with Imfinzi IV every 4 weeks   The patient had evidence of disease progression on his scan from December 2021.   He was then started on systemic chemotherapy with carboplatin for an AUC of 5 on day 1, etoposide 100 mg per metered squared on days 1, 2, and 3 in addition to Tecentriq 1200 mg on day 1, the patient is status post 8 cycles.  Starting from cycle #5, the patient started maintenance immunotherapy with Tecentriq.  This was discontinued due to evidence of disease progression.  The patient then underwent Zepzelca IV every 3 weeks.  He is status post his 12 cycles and he tolerated it well except for fatigue a few days after treatment.  This was discontinued in February 2023 due to evidence of disease progression.     The patient then underwent treatment with irinotecan 65 mg per metered squared on days 1 and 8 IV every 3 weeks.  The patient is status post 3 cycles and tolerated it fairly well without any concerning adverse side effects.  This was discontinued secondary to disease progression.   Dr. Arbutus Ped saw the patient after this to discuss the options including palliative care and hospice versus single agent Gemzar, or Taxol.  He also discussed repeating his initial treatment with carboplatin and etoposide since he had a good response to treatment in the past.   He was referred to Dr. Janann August at [facility]  for consideration of enrollment in the clinical trial with tarlatamab.    He also saw Dr. Loney Hering at [facility] for consideration of enrollment in the [place] [id number] trial.  The patient was not interested due to the travel burden.  The patient was interested in restarting systemic chemotherapy with carboplatin for an AUC of 5 on day 1 and etoposide 100 mg per metered squared on days 1, 2, and 3 with Cosela. He is status post 14 cycles and tolerated it well. His dose of carboplatin was reduced to an AUC of 4 and etoposide 80 mg per metered squared.     He underwent SBRT to this which was completed on 01/27/23.    The patient was seen by Dr. Janann August at [facility] and treated with tarlatamab which is a by specific T-cell engager (BiTE) for the last 8 months.  Unfortunately the repeat CT scan of the chest, abdomen and pelvis in December 2024 showed interval mild to moderate increase in the size of the segment 7 liver lesion in addition to interval mild increase in the size of portacaval and common hepatic artery lymph nodes with stable size retroperitoneal lymph nodes and stable size right adrenal nodule.  There was no evidence for disease progression in the chest.  There was focal enhancement in the right iliac bone concerning for metastatic disease.   He recently completed palliative radiation to the right  iliac bone lesion in February 2025.   Dr. Arbutus Ped recommended Topotecan regimen: 5 days in a row every 3 weeks.  He underwent his first cycle of treatment on 01/11/2024.  He tolerated it fairly well except he did have some significant cytopenias.   Last week, his platelet count got down to 16,000.  This is recovered to 100 91K today.  I let the patient know moving forward that we will monitor his labs closely on a weekly basis.  Should his platelet count be less than 50,000, we would recommend holding his blood thinner until his platelet count is greater than 50.  We would also consider G-CSF injections if his ANC were less than 0.6.  I have added standing orders for sample to blood bank.  Reviewed his labs today with Dr. Arbutus Ped.  The patient's total white blood cell count continues to be low at 1.9 and his ANC is 1.0.  His hemoglobin is 8.1.  Due to his persistent neutropenia, his labs are not in parameters to proceed with treatment today.   Therefore, we will defer his care plan by 1 week.  We will recheck his labs next week prior to starting cycle #2.  We will also consider the doses of his topotecan next week when we see him for a follow-up prior to treatment.  Instead, we will arrange for 1 unit of blood to be administered today for his anemia.  I sent him a prescription for Remeron to take 15 mg p.o. at night to help with his decreased appetite.  Would also recommend salt water rinses and Biotene.  He is scheduled to see palliative care today.   We will see him back for follow-up visit in 1 weeks for evaluation repeat blood work before undergoing cycle #2  Per Dr. Asa Lente previous notes, he may consider switching him to carboplatin and paclitaxel if topotecan is ineffective.  The patient was advised to call immediately if she has any concerning symptoms in the interval. The patient voices understanding of current disease status and treatment options and is in agreement with the current  care plan. All questions were answered. The patient knows to call the clinic with any problems, questions or concerns. We can certainly see the patient much sooner if necessary        No orders of the defined types were placed in this encounter.   . The total time spent in the appointment was 30-39 minutes  Heily Carlucci L Ciria Bernardini, PA-C 01/30/24

## 2024-01-31 ENCOUNTER — Other Ambulatory Visit: Payer: Self-pay | Admitting: Primary Care

## 2024-01-31 DIAGNOSIS — J9611 Chronic respiratory failure with hypoxia: Secondary | ICD-10-CM

## 2024-02-01 ENCOUNTER — Inpatient Hospital Stay: Payer: Medicare Other

## 2024-02-01 ENCOUNTER — Inpatient Hospital Stay (HOSPITAL_BASED_OUTPATIENT_CLINIC_OR_DEPARTMENT_OTHER): Payer: Medicare Other | Admitting: Nurse Practitioner

## 2024-02-01 ENCOUNTER — Other Ambulatory Visit: Payer: Self-pay | Admitting: Physician Assistant

## 2024-02-01 ENCOUNTER — Ambulatory Visit: Payer: Medicare Other

## 2024-02-01 ENCOUNTER — Inpatient Hospital Stay (HOSPITAL_BASED_OUTPATIENT_CLINIC_OR_DEPARTMENT_OTHER): Payer: Medicare Other | Admitting: Physician Assistant

## 2024-02-01 ENCOUNTER — Encounter: Payer: Self-pay | Admitting: Nurse Practitioner

## 2024-02-01 VITALS — BP 94/66 | HR 104 | Temp 97.0°F | Resp 17 | Wt 249.7 lb

## 2024-02-01 DIAGNOSIS — Z515 Encounter for palliative care: Secondary | ICD-10-CM | POA: Diagnosis not present

## 2024-02-01 DIAGNOSIS — C7951 Secondary malignant neoplasm of bone: Secondary | ICD-10-CM | POA: Diagnosis not present

## 2024-02-01 DIAGNOSIS — D649 Anemia, unspecified: Secondary | ICD-10-CM | POA: Diagnosis not present

## 2024-02-01 DIAGNOSIS — D61818 Other pancytopenia: Secondary | ICD-10-CM | POA: Diagnosis not present

## 2024-02-01 DIAGNOSIS — C3411 Malignant neoplasm of upper lobe, right bronchus or lung: Secondary | ICD-10-CM

## 2024-02-01 DIAGNOSIS — R5382 Chronic fatigue, unspecified: Secondary | ICD-10-CM

## 2024-02-01 DIAGNOSIS — R634 Abnormal weight loss: Secondary | ICD-10-CM

## 2024-02-01 DIAGNOSIS — R630 Anorexia: Secondary | ICD-10-CM | POA: Diagnosis not present

## 2024-02-01 DIAGNOSIS — R530 Neoplastic (malignant) related fatigue: Secondary | ICD-10-CM | POA: Diagnosis not present

## 2024-02-01 DIAGNOSIS — Z5111 Encounter for antineoplastic chemotherapy: Secondary | ICD-10-CM | POA: Diagnosis not present

## 2024-02-01 DIAGNOSIS — G893 Neoplasm related pain (acute) (chronic): Secondary | ICD-10-CM

## 2024-02-01 DIAGNOSIS — Z95828 Presence of other vascular implants and grafts: Secondary | ICD-10-CM

## 2024-02-01 DIAGNOSIS — Z510 Encounter for antineoplastic radiation therapy: Secondary | ICD-10-CM | POA: Diagnosis not present

## 2024-02-01 LAB — SAMPLE TO BLOOD BANK

## 2024-02-01 LAB — PREPARE RBC (CROSSMATCH)

## 2024-02-01 LAB — CBC WITH DIFFERENTIAL (CANCER CENTER ONLY)
Abs Immature Granulocytes: 0.03 10*3/uL (ref 0.00–0.07)
Basophils Absolute: 0 10*3/uL (ref 0.0–0.1)
Basophils Relative: 1 %
Eosinophils Absolute: 0 10*3/uL (ref 0.0–0.5)
Eosinophils Relative: 2 %
HCT: 26 % — ABNORMAL LOW (ref 39.0–52.0)
Hemoglobin: 8.1 g/dL — ABNORMAL LOW (ref 13.0–17.0)
Immature Granulocytes: 2 %
Lymphocytes Relative: 17 %
Lymphs Abs: 0.3 10*3/uL — ABNORMAL LOW (ref 0.7–4.0)
MCH: 28.6 pg (ref 26.0–34.0)
MCHC: 31.2 g/dL (ref 30.0–36.0)
MCV: 91.9 fL (ref 80.0–100.0)
Monocytes Absolute: 0.5 10*3/uL (ref 0.1–1.0)
Monocytes Relative: 26 %
Neutro Abs: 1 10*3/uL — ABNORMAL LOW (ref 1.7–7.7)
Neutrophils Relative %: 52 %
Platelet Count: 191 10*3/uL (ref 150–400)
RBC: 2.83 MIL/uL — ABNORMAL LOW (ref 4.22–5.81)
RDW: 15.5 % (ref 11.5–15.5)
WBC Count: 1.9 10*3/uL — ABNORMAL LOW (ref 4.0–10.5)
nRBC: 1.6 % — ABNORMAL HIGH (ref 0.0–0.2)

## 2024-02-01 LAB — CMP (CANCER CENTER ONLY)
ALT: 12 U/L (ref 0–44)
AST: 16 U/L (ref 15–41)
Albumin: 3.3 g/dL — ABNORMAL LOW (ref 3.5–5.0)
Alkaline Phosphatase: 66 U/L (ref 38–126)
Anion gap: 8 (ref 5–15)
BUN: 13 mg/dL (ref 8–23)
CO2: 29 mmol/L (ref 22–32)
Calcium: 8.1 mg/dL — ABNORMAL LOW (ref 8.9–10.3)
Chloride: 102 mmol/L (ref 98–111)
Creatinine: 1.09 mg/dL (ref 0.61–1.24)
GFR, Estimated: >60 mL/min (ref >60)
Glucose, Bld: 151 mg/dL — ABNORMAL HIGH (ref 70–99)
Potassium: 4.1 mmol/L (ref 3.5–5.1)
Sodium: 139 mmol/L (ref 135–145)
Total Bilirubin: 0.3 mg/dL (ref 0.0–1.2)
Total Protein: 5.7 g/dL — ABNORMAL LOW (ref 6.5–8.1)

## 2024-02-01 MED ORDER — HEPARIN SOD (PORK) LOCK FLUSH 100 UNIT/ML IV SOLN
250.0000 [IU] | INTRAVENOUS | Status: AC | PRN
  Administered 2024-02-01: 500 [IU]

## 2024-02-01 MED ORDER — MIRTAZAPINE 15 MG PO TABS: 15.0000 mg | ORAL_TABLET | Freq: Every day | ORAL | 2 refills | Status: DC

## 2024-02-01 MED ORDER — SODIUM CHLORIDE 0.9% FLUSH
10.0000 mL | INTRAVENOUS | Status: AC | PRN
Start: 2024-02-01 — End: 2024-02-01
  Administered 2024-02-01: 10 mL

## 2024-02-01 MED ORDER — ACETAMINOPHEN 325 MG PO TABS
650.0000 mg | ORAL_TABLET | Freq: Once | ORAL | Status: AC
  Administered 2024-02-01: 650 mg via ORAL
  Filled 2024-02-01: qty 2

## 2024-02-01 MED ORDER — CETIRIZINE HCL 10 MG PO TABS
10.0000 mg | ORAL_TABLET | Freq: Once | ORAL | Status: AC
  Administered 2024-02-01: 10 mg via ORAL
  Filled 2024-02-01: qty 1

## 2024-02-01 MED ORDER — SODIUM CHLORIDE 0.9% FLUSH
10.0000 mL | Freq: Once | INTRAVENOUS | Status: AC
  Administered 2024-02-01: 10 mL

## 2024-02-01 MED ORDER — SODIUM CHLORIDE 0.9% IV SOLUTION
250.0000 mL | INTRAVENOUS | Status: DC
Start: 2024-02-01 — End: 2024-02-01
  Administered 2024-02-01: 100 mL via INTRAVENOUS

## 2024-02-01 MED ORDER — DIPHENHYDRAMINE HCL 25 MG PO CAPS: 25.0000 mg | ORAL_CAPSULE | Freq: Once | ORAL | Status: DC

## 2024-02-01 NOTE — Patient Instructions (Signed)
 Blood Transfusion, Adult A blood transfusion is a procedure in which you receive blood or a type of blood cell (blood component) through an IV. You may need a blood transfusion when you have a low blood count, which is a low number of any blood cell. This may result from a bleeding disorder, illness, injury, or surgery. The blood may come from a donor, or you may be able to have your own blood collected and stored (autologous blood donation) before a planned surgery. The blood given in a transfusion may be made up of different blood components. You may receive: Red blood cells. These carry oxygen to the cells in the body. Platelets. These help your blood to clot. Plasma. This is the liquid part of your blood. It carries proteins and other substances throughout the body. White blood cells. These help you fight infections. If you have hemophilia or another clotting disorder, you may also receive other types of blood products. Depending on the type of blood product, this procedure may take 1-4 hours to complete. Tell a health care provider about: Any bleeding problems you have. Any previous reactions you have had during a blood transfusion. Any allergies you have. All medicines you are taking, including vitamins, herbs, eye drops, creams, and over-the-counter medicines. Any surgeries you have had. Any medical conditions you have. Whether you are pregnant or may be pregnant. What are the risks? Talk with your health care provider about risks. The most common problems include: A mild allergic reaction, such as red, swollen areas of skin (hives) and itching. Fever or chills. This may be the body's response to new blood cells received. This may occur during or up to 4 hours after the transfusion. More serious problems may include: A serious allergic reaction that causes difficulty breathing or swelling around the face and lips. Transfusion-associated circulatory overload (TACO), or too much fluid in  the lungs. This may cause breathing problems. Transfusion-related acute lung injury (TRALI), which causes breathing difficulty and low oxygen in the blood. This can occur within hours of the transfusion or several days later. Iron overload. This can happen after receiving many blood transfusions over a period of time. Infection or virus being transmitted. This is rare because donated blood is carefully tested before it is given. Hemolytic transfusion reaction. This is rare. It happens when the body's defense system (immune system)tries to attack the new blood cells. Symptoms may include fever, chills, nausea, low blood pressure, and low back or chest pain. Transfusion-associated graft-versus-host disease (TAGVHD). This is rare. It happens when donated cells attack the body's healthy tissues. What happens before the procedure? You will have a blood test to check your blood type. This test is done to know what kind of blood your body will accept and to match it to the donor blood. If you are going to have a planned surgery, you may be able to do an autologous blood donation. This may be done in case you need to have a transfusion. You will have your temperature, blood pressure, and pulse checked before the transfusion. If you have had an allergic reaction to a transfusion in the past, you may be given medicine to help prevent a reaction. This medicine may be given to you by mouth (orally) or through an IV. What happens during the procedure?  An IV will be inserted into one of your veins. The bag of blood will be attached to your IV. The blood will then enter through your vein. Your temperature, blood pressure,  and pulse will be monitored during the transfusion. This monitoring is done to detect early signs of a transfusion reaction. Tell your nurse right away if you have any of these symptoms during the transfusion: Shortness of breath or trouble breathing. Chest or back pain. Fever or  chills. Itching or hives. If you have any signs or symptoms of a reaction, your transfusion will be stopped and you may be given medicine. When the transfusion is complete, your IV will be removed. Pressure may be applied to the IV site for a few minutes. A bandage (dressing)will be applied. The procedure may vary among health care providers and hospitals. What happens after the procedure? Your temperature, blood pressure, pulse, breathing rate, and blood oxygen level will be monitored until you leave the hospital or clinic. Your blood may be tested to see how you have responded to the transfusion. You may be warmed with fluids or blankets to maintain a normal body temperature. If you receive your blood transfusion in an outpatient setting, you will be told whom to contact to report any reactions. Where to find more information Visit the [employer]: redcross.org Summary A blood transfusion is a procedure in which you receive blood or a type of blood cell (blood component) through an IV. The blood given in a transfusion may be made up of different blood components. You may receive red blood cells, platelets, plasma, or white blood cells depending on the condition treated. Your temperature, blood pressure, and pulse will be monitored before, during, and after the transfusion. After the transfusion, your blood may be tested to see how your body has responded. This information is not intended to replace advice given to you by your health care provider. Make sure you discuss any questions you have with your health care provider. Document Revised: 02/28/2022 Document Reviewed: 02/28/2022 [employer] Patient Education  2024 [employer].

## 2024-02-02 ENCOUNTER — Inpatient Hospital Stay: Payer: Medicare Other

## 2024-02-02 LAB — BPAM RBC
Blood Product Expiration Date: 202503192359
ISSUE DATE / TIME: 202502171440
Unit Type and Rh: 7300

## 2024-02-02 LAB — TYPE AND SCREEN
ABO/RH(D): B POS
Antibody Screen: NEGATIVE
Unit division: 0

## 2024-02-03 ENCOUNTER — Inpatient Hospital Stay: Payer: Medicare Other

## 2024-02-03 ENCOUNTER — Encounter: Payer: Self-pay | Admitting: Internal Medicine

## 2024-02-04 ENCOUNTER — Ambulatory Visit: Payer: Medicare Other

## 2024-02-05 ENCOUNTER — Ambulatory Visit: Payer: Medicare Other

## 2024-02-08 ENCOUNTER — Inpatient Hospital Stay (HOSPITAL_BASED_OUTPATIENT_CLINIC_OR_DEPARTMENT_OTHER): Payer: Medicare Other | Admitting: Internal Medicine

## 2024-02-08 ENCOUNTER — Inpatient Hospital Stay: Payer: Medicare Other

## 2024-02-08 ENCOUNTER — Other Ambulatory Visit: Payer: Self-pay

## 2024-02-08 VITALS — HR 98

## 2024-02-08 VITALS — BP 101/78 | HR 105 | Temp 97.3°F | Resp 17 | Ht 69.0 in | Wt 245.8 lb

## 2024-02-08 DIAGNOSIS — C3411 Malignant neoplasm of upper lobe, right bronchus or lung: Secondary | ICD-10-CM

## 2024-02-08 DIAGNOSIS — Z510 Encounter for antineoplastic radiation therapy: Secondary | ICD-10-CM | POA: Diagnosis not present

## 2024-02-08 DIAGNOSIS — C7951 Secondary malignant neoplasm of bone: Secondary | ICD-10-CM | POA: Diagnosis not present

## 2024-02-08 DIAGNOSIS — Z95828 Presence of other vascular implants and grafts: Secondary | ICD-10-CM

## 2024-02-08 LAB — CBC WITH DIFFERENTIAL (CANCER CENTER ONLY)
Abs Immature Granulocytes: 0.03 10*3/uL (ref 0.00–0.07)
Basophils Absolute: 0 10*3/uL (ref 0.0–0.1)
Basophils Relative: 1 %
Eosinophils Absolute: 0 10*3/uL (ref 0.0–0.5)
Eosinophils Relative: 1 %
HCT: 32.5 % — ABNORMAL LOW (ref 39.0–52.0)
Hemoglobin: 9.9 g/dL — ABNORMAL LOW (ref 13.0–17.0)
Immature Granulocytes: 1 %
Lymphocytes Relative: 9 %
Lymphs Abs: 0.4 10*3/uL — ABNORMAL LOW (ref 0.7–4.0)
MCH: 28.9 pg (ref 26.0–34.0)
MCHC: 30.5 g/dL (ref 30.0–36.0)
MCV: 94.8 fL (ref 80.0–100.0)
Monocytes Absolute: 0.7 10*3/uL (ref 0.1–1.0)
Monocytes Relative: 14 %
Neutro Abs: 3.5 10*3/uL (ref 1.7–7.7)
Neutrophils Relative %: 74 %
Platelet Count: 286 10*3/uL (ref 150–400)
RBC: 3.43 MIL/uL — ABNORMAL LOW (ref 4.22–5.81)
RDW: 17.1 % — ABNORMAL HIGH (ref 11.5–15.5)
WBC Count: 4.6 10*3/uL (ref 4.0–10.5)
nRBC: 0 % (ref 0.0–0.2)

## 2024-02-08 LAB — CMP (CANCER CENTER ONLY)
ALT: 12 U/L (ref 0–44)
AST: 21 U/L (ref 15–41)
Albumin: 3.4 g/dL — ABNORMAL LOW (ref 3.5–5.0)
Alkaline Phosphatase: 71 U/L (ref 38–126)
Anion gap: 8 (ref 5–15)
BUN: 10 mg/dL (ref 8–23)
CO2: 30 mmol/L (ref 22–32)
Calcium: 8.1 mg/dL — ABNORMAL LOW (ref 8.9–10.3)
Chloride: 104 mmol/L (ref 98–111)
Creatinine: 1.31 mg/dL — ABNORMAL HIGH (ref 0.61–1.24)
GFR, Estimated: 57 mL/min — ABNORMAL LOW (ref >60)
Glucose, Bld: 135 mg/dL — ABNORMAL HIGH (ref 70–99)
Potassium: 4.6 mmol/L (ref 3.5–5.1)
Sodium: 142 mmol/L (ref 135–145)
Total Bilirubin: 0.3 mg/dL (ref 0.0–1.2)
Total Protein: 5.9 g/dL — ABNORMAL LOW (ref 6.5–8.1)

## 2024-02-08 LAB — SAMPLE TO BLOOD BANK

## 2024-02-08 MED ORDER — TRILACICLIB DIHYDROCHLORIDE INJECTION 300 MG
240.0000 mg/m2 | Freq: Once | INTRAVENOUS | Status: AC
  Administered 2024-02-08: 600 mg via INTRAVENOUS
  Filled 2024-02-08: qty 40

## 2024-02-08 MED ORDER — SODIUM CHLORIDE 0.9 % IV SOLN: INTRAVENOUS | Status: DC

## 2024-02-08 MED ORDER — PROCHLORPERAZINE MALEATE 10 MG PO TABS
10.0000 mg | ORAL_TABLET | Freq: Once | ORAL | Status: AC
  Administered 2024-02-08: 10 mg via ORAL
  Filled 2024-02-08: qty 1

## 2024-02-08 MED ORDER — SODIUM CHLORIDE 0.9% FLUSH
10.0000 mL | Freq: Once | INTRAVENOUS | Status: AC
  Administered 2024-02-08: 10 mL

## 2024-02-08 MED ORDER — TOPOTECAN HCL CHEMO INJECTION 4 MG
1.2000 mg/m2 | Freq: Once | INTRAVENOUS | Status: AC
  Administered 2024-02-08: 2.9 mg via INTRAVENOUS
  Filled 2024-02-08: qty 2.9

## 2024-02-08 NOTE — Progress Notes (Signed)
 [facility] [phone]   [phone]  OFFICE PROGRESS NOTE  Doreene Nest, NP [address] [place] [address]  DIAGNOSIS: Relapsed extensive stage (T4, N3, M1c)  small cell lung cancer diagnosed in April 2021 and presented with large right upper lobe/right hilar mass with ipsilateral and contralateral mediastinal and right supraclavicular lymphadenopathy in addition to multiple liver lesions. He has disease progression after the first line of chemotherapy in December 2021.  PRIOR THERAPY: 1) Palliative radiotherapy to the right upper lobe lung mass under the care of Dr. Mitzi Hansen. 2) Systemic chemotherapy with carboplatin for AUC of 5 on day 1, etoposide 100 mg/M2 on days 1, 2 and 3 in addition to Imfinzi 1500 mg IV every 3 weeks with chemotherapy then every 4 weeks for maintenance if the patient has no evidence for progression.  He will also receive Cosela 240 mg/m2 on the days of the chemotherapy.  Status post 9 cycles.  Starting from cycle #5 the patient will be on maintenance treatment with immunotherapy with Imfinzi 1500 mg IV every 4 weeks. Last dose of chemotherapy was given on November 13, 2020. This treatment was discontinued secondary to disease progression 3) Systemic chemotherapy with carboplatin for AUC of 5 on day 1, etoposide 100 mg/M2 on days 1, 2 and 3, Tecentriq 1200 mg IV every 3 weeks as well as Cosela 250 mg/M2 on the days of the chemotherapy every 3 weeks.  First dose December 18, 2020.  Status post 8 cycles. 4) Zepzelca (lurbinectedin) 3.2 mgm2 IV every 3 weeks. Last dose on 01/23/22. Status post 12 cycles.  5) Palliative systemic chemotherapy with irinotecan 65 mg/m2 on days 1 and 8 IV every 3 weeks.  Status post 3 cycles.  Last dose was given April 01, 2022 discontinued secondary to disease progression. 6) SBRT to the progressive liver lesions under the care of Dr. Mitzi Hansen.  Last fraction January 27, 2023. 7) systemic chemotherapy  with carboplatin for AUC of 5 on day 1, etoposide 100 Mg/M2 on days 1, 2 and 3 with Cosela before the chemotherapy.  First dose expected to start on 05/28/2022.  Status post 15 cycles.  Starting from cycle #5 his carboplatin will be reduced to AUC of 4 and 2 etoposide 80 Mg/M2.  Last dose was giving April 07, 2023 discontinued for suspicious disease progression. 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  First dose January 04, 2024.  Status post 1 cycle.  INTERVAL HISTORY: Adam Wise 73 y.o. male returns to the clinic today for follow-up visit accompanied by his wife. Discussed the use of AI scribe software for clinical note transcription with the patient, who gave verbal consent to proceed.  History of Present Illness   Adam Wise "[name]" is a 73 year old male with extensive stage small cell lung cancer who presents for follow-up of his cancer treatment. He is accompanied by his wife.  He was diagnosed with extensive stage small cell lung cancer in April 2021 and has undergone multiple treatments including chemotherapy, immunotherapy, and tarlatan map at [facility], which were unsuccessful. Recently, he received radiation therapy to his right hip. Currently, he is on topotecan, administered five days a week every three weeks. He has completed one cycle, but the second cycle was delayed by two weeks due  to low blood counts. He received injections to boost his counts, and his current blood work shows improvement with a white blood count of 4.6, hemoglobin of 9.9, hematocrit of 32.5, and platelet count of 286,000.  He has been experiencing issues with appetite and weight loss, having lost 60 pounds gradually. He has not yet started Remeron due to concerns about constipation, a known side effect. His diet includes snacking and meals such  as steak and baked potatoes, which he enjoys. He has been using stool softeners to manage constipation, and notes that his constipation has improved recently, which may have contributed to some weight loss.  He reports feeling physically good today. He experienced a fall in the garage while sitting on a rolling stool but did not sustain any injuries. He mentions that his blood pressure has been low on several occasions, leading to adjustments in his blood pressure medication by his primary care provider. He describes feeling 'off balance' but not dizzy or lightheaded. No injuries from the recent fall.       MEDICAL HISTORY: Past Medical History:  Diagnosis Date   Acute on chronic respiratory failure with hypoxia (HCC) 10/12/2021   Chickenpox    Chronic knee pain    Chronic low back pain    COPD exacerbation (HCC) 10/11/2021   COPD with exacerbation (HCC) 10/12/2021   Coronary artery calcification seen on CAT scan 11/2021   Coronary CTA 11/27/2021: Moderate to large right pleural effusion and compressive atelectasis right lung base. => Coronary Calcium Score 657.  Diffuse RCA calcification.  Minimal mild disease in the LAD and diagonal branches. == Overall limited study.  Notable artifact.   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 He has disease progression after the first line of chemotherapy in December 2021.   Testosterone deficiency    Type 2 diabetes mellitus (HCC)     ALLERGIES:  is allergic to bupropion and hydrochlorothiazide.  MEDICATIONS:  Current Outpatient Medications   Medication Sig Dispense Refill   albuterol (VENTOLIN HFA) 108 (90 Base) MCG/ACT inhaler INHALE 2 PUFFS INTO THE LUNGS EVERY 4 (FOUR) HOURS AS NEEDED FOR SHORTNESS OF BREATH 18 each 0   amLODipine (NORVASC) 10 MG tablet Take 0.5 tablets (5 mg total) by mouth daily. TAKE 1 TABLET BY MOUTH EVERY DAY for blood pressure.     B-D 3CC LUER-LOK SYR 22GX1" 22G X 1" 3 ML MISC USE AS INSTRUCTED FOR TESTOSTERONE INJECTION EVERY 2 WEEKS 10 each 2   Black Pepper-Turmeric (TURMERIC COMPLEX/BLACK PEPPER PO) Take 1 tablet by mouth in the morning and at bedtime.     Coenzyme Q10 (COQ10) 200 MG CAPS Take 200 mg by mouth at bedtime.     cyclobenzaprine (FLEXERIL) 5 MG tablet TAKE 1 TABLET BY MOUTH AT BEDTIME AS NEEDED FOR MUSCLE SPASMS. 90 tablet 0   docusate sodium (COLACE) 100 MG capsule Take 1 capsule (100 mg total) by mouth every 12 (twelve) hours. (Patient not taking: Reported on 01/28/2024) 60 capsule 0   ELIQUIS 5 MG TABS tablet TAKE 1 TABLET BY MOUTH TWICE A DAY 180 tablet 2   esomeprazole (  NEXIUM) 20 MG capsule Take 20 mg by mouth in the morning.     gabapentin (NEURONTIN) 300 MG capsule TAKE 1 CAPSULE (300 MG TOTAL) BY MOUTH 2 (TWO) TIMES DAILY. FOR BACK PAIN. 180 capsule 1   glucose blood (ONETOUCH ULTRA) test strip USE UP TO 4 TIMES DAILY AS DIRECTED 400 strip 5   Insulin Pen Needle (BD PEN NEEDLE NANO U/F) 32G X 4 MM MISC Use with insulin as prescribed Dx Code: E11.9 100 each 3   ipratropium-albuterol (DUONEB) 0.5-2.5 (3) MG/3ML SOLN Take 3 mLs by nebulization every 4 (four) hours while awake for 3 days, THEN 3 mLs every 4 (four) hours as needed (shortness of breath or wheezing). 360 mL 0   KRILL OIL PO Take 1 capsule by mouth daily.     Lancets MISC USE UP TO 3 TIMES DAILY AS DIRECTED 100 each 2   lidocaine (LIDODERM) 5 % Place 1 patch onto the skin daily. Remove & Discard patch within 12 hours or as directed by MD 30 patch 0   losartan (COZAAR) 50 MG tablet TAKE 1 TABLET (50 MG TOTAL) BY MOUTH DAILY.  FOR BLOOD PRESSURE. 90 tablet 3   Melatonin 10 MG CAPS Take 10 mg by mouth at bedtime as needed (for sleep). (Patient not taking: Reported on 01/28/2024)     metFORMIN (GLUCOPHAGE) 1000 MG tablet TAKE 1 TABLET (1,000 MG TOTAL) BY MOUTH 2 (TWO) TIMES DAILY WITH A MEAL. FOR DIABETES 180 tablet 1   mirtazapine (REMERON) 15 MG tablet Take 1 tablet (15 mg total) by mouth at bedtime. 30 tablet 2   morphine (MS CONTIN) 30 MG 12 hr tablet Take 1 tablet (30 mg total) by mouth 2 (two) times daily. 60 tablet 0   Multiple Vitamin (MULTI-VITAMINS) TABS Take 1 tablet by mouth daily with breakfast.     Naloxone HCl 3 MG/0.1ML LIQD Place 1 spray into both nostrils once. For known/suspected opiod overdose. Every 2-3 minutes in alternating nostril till EMS arrives.     ondansetron (ZOFRAN) 4 MG tablet Take 1 tablet (4 mg total) by mouth every 8 (eight) hours as needed for nausea or vomiting. 30 tablet 0   oxyCODONE-acetaminophen (PERCOCET) 10-325 MG tablet Take 1 tablet by mouth every 4 (four) hours as needed.     pioglitazone (ACTOS) 45 MG tablet TAKE 1 TABLET (45 MG TOTAL) BY MOUTH DAILY. FOR DIABETES. 90 tablet 1   pravastatin (PRAVACHOL) 40 MG tablet TAKE 1 TABLET BY MOUTH EVERY DAY IN THE EVENING FOR CHOLESTEROL 90 tablet 3   PRESCRIPTION MEDICATION CPAP- At bedtime     testosterone cypionate (DEPOTESTOSTERONE CYPIONATE) 200 MG/ML injection Inject 200 mg into the muscle every 14 (fourteen) days.     No current facility-administered medications for this visit.    SURGICAL HISTORY:  Past Surgical History:  Procedure Laterality Date   CHEST TUBE INSERTION Right 01/20/2022   Procedure: CHEST TUBE INSERTION;  Surgeon: Lorin Glass, MD;  Location: [facility] ENDOSCOPY;  Service: Pulmonary;  Laterality: Right;  w/ Talc Pleurodesis, planned admit for Obs afterwards   CHEST TUBE INSERTION Right 04/28/2022   Procedure: INSERTION PLEURAL DRAINAGE CATHETER - Pigtail tail, drainage;  Surgeon: Lorin Glass, MD;  Location: [facility]  ENDOSCOPY;  Service: Pulmonary;  Laterality: Right;  talc pleurodesis   CHEST TUBE INSERTION Right 09/01/2022   Procedure: INSERTION PLEURAL DRAINAGE CATHETER;  Surgeon: Lupita Leash, MD;  Location: [facility] ENDOSCOPY;  Service: Cardiopulmonary;  Laterality: Right;  pigtail catheter placement with talc pleurodesis  COLONOSCOPY WITH PROPOFOL N/A 12/17/2018   Procedure: COLONOSCOPY WITH PROPOFOL;  Surgeon: Pasty Spillers, MD;  Location: [facility] ENDOSCOPY;  Service: Gastroenterology;  Laterality: N/A;   IR IMAGING GUIDED PORT INSERTION  04/17/2020   IR THORACENTESIS ASP PLEURAL SPACE W/IMG GUIDE  11/29/2021   IR THORACENTESIS ASP PLEURAL SPACE W/IMG GUIDE  01/03/2022   JOINT REPLACEMENT Bilateral    REPLACEMENT TOTAL KNEE BILATERAL  2015   TALC PLEURODESIS  09/01/2022   Procedure: Lurlean Nanny;  Surgeon: Lupita Leash, MD;  Location: [facility] ENDOSCOPY;  Service: Cardiopulmonary;;   TONSILLECTOMY  1960   TRANSTHORACIC ECHOCARDIOGRAM  05/2020   a) 05/2020: EF 55 to 60%.  No R WMA.  Mild LVH.  Indeterminate LVEDP.  Unable to assess RVP.  Normal aortic and mitral valves.  Mildly elevated RAP.; b) 09/2021: EF 50-55%. No RWMA. Mild LVH. ~ LVEDP. Mild LA Dilation. NORMAL RV/RAP.  Normal MV/AoV.    REVIEW OF SYSTEMS:  Constitutional: positive for fatigue Eyes: negative Ears, nose, mouth, throat, and face: negative Respiratory: negative Cardiovascular: negative Gastrointestinal: negative Genitourinary:negative Integument/breast: negative Hematologic/lymphatic: negative Musculoskeletal:positive for arthralgias Neurological: negative Behavioral/Psych: negative Endocrine: negative Allergic/Immunologic: negative   PHYSICAL EXAMINATION: General appearance: alert, cooperative, fatigued, and no distress Head: Normocephalic, without obvious abnormality, atraumatic Neck: no adenopathy, no JVD, supple, symmetrical, trachea midline, and thyroid not enlarged, symmetric, no  tenderness/mass/nodules Lymph nodes: Cervical, supraclavicular, and axillary nodes normal. Resp: clear to auscultation bilaterally Back: symmetric, no curvature. ROM normal. No CVA tenderness. Cardio: regular rate and rhythm, S1, S2 normal, no murmur, click, rub or gallop GI: soft, non-tender; bowel sounds normal; no masses,  no organomegaly Extremities: extremities normal, atraumatic, no cyanosis or edema Neurologic: Alert and oriented X 3, normal strength and tone. Normal symmetric reflexes. Normal coordination and gait  ECOG PERFORMANCE STATUS: 1 - Symptomatic but completely ambulatory  Blood pressure 101/78, pulse (!) 105, temperature (!) 97.3 F (36.3 C), temperature source Temporal, resp. rate 17, height 5\' 9"  (1.753 m), weight 245 lb 12.8 oz (111.5 kg), SpO2 95%.   LABORATORY DATA: Lab Results  Component Value Date   WBC 1.9 (L) 02/01/2024   HGB 8.1 (L) 02/01/2024   HCT 26.0 (L) 02/01/2024   MCV 91.9 02/01/2024   PLT 191 02/01/2024      Chemistry      Component Value Date/Time   NA 139 02/01/2024 0944   K 4.1 02/01/2024 0944   CL 102 02/01/2024 0944   CO2 29 02/01/2024 0944   BUN 13 02/01/2024 0944   CREATININE 1.09 02/01/2024 0944   CREATININE 1.45 (H) 10/18/2021 1533      Component Value Date/Time   CALCIUM 8.1 (L) 02/01/2024 0944   ALKPHOS 66 02/01/2024 0944   AST 16 02/01/2024 0944   ALT 12 02/01/2024 0944   BILITOT 0.3 02/01/2024 0944       RADIOGRAPHIC STUDIES:    ASSESSMENT AND PLAN: This is a very pleasant 73 year old Caucasian male diagnosed with extensive stage (T4, N3, M1C) small cell lung cancer presented with large right upper lobe lung mass in addition to mediastinal and right supraclavicular lymphadenopathy and multiple metastatic liver lesions diagnosed in April 2021. The patient initially underwent systemic chemotherapy with carboplatin for an AUC of 5 on day 1, etoposide 100 mg per metered squared on days 1, 2, and 3 in addition to [facility] for  myeloprotection.  He also received immunotherapy with Imfinzi on day 1 of every chemotherapy cycle.  He is status post 9 cycles.  Starting from cycle #5 he was on single agent immunotherapy with Imfinzi IV every 4 weeks.  The patient had evidence of disease progression on his scan from December 2021.  He was then started on systemic chemotherapy with carboplatin for an AUC of 5 on day 1, etoposide 100 mg per metered squared on days 1, 2, and 3 in addition to Tecentriq 1200 mg on day 1, the patient is status post 9 cycles.  Starting from cycle #5, the patient started maintenance immunotherapy with Tecentriq.  This was discontinued due to evidence of disease progression. The patient started third line treatment with Zepzelca (lurbinectedin) 3.2 Mg/M2 every 3-week status post 12 cycles of this treatment and he has been tolerating it fairly well except for fatigue.  This treatment was discontinued secondary to disease progression. He underwent fourth line treatment with systemic chemotherapy with irinotecan 65 Mg/M2 on days 1 and 8 every 3 weeks.  Status post 3 cycles.  He tolerated this treatment well with no concerning adverse effect except for mild fatigue. Unfortunately his scan showed evidence for disease progression especially in the liver. I recommended for the patient to discontinue his current treatment with irinotecan at this point. He started systemic chemotherapy again with carboplatin for AUC of 5 on day 1, etoposide 100 Mg/M2 on days 1, 2 and 3 with Cosela status post 15 cycles. I did reduce the dose of carboplatin to AUC of 4 and etoposide 80 Mg/M2 starting from cycle #5.  He has been tolerating this treatment fairly well with no concerning complaints except for the increasing fatigue and weakness as well as the chemotherapy-induced pancytopenia. He had repeat CT scan of the chest, abdomen and pelvis performed recently.  I personally and independently reviewed the scan images and discussed the  result and showed the images to the patient and his wife.  His scan showed redemonstration of the posttreatment/postradiation appearance of the right chest with dense fibrosis and consolidation of the perihilar right lung as well as a small loculated appearing right pleural effusion with associated pleural densities suggesting prior talc pleurodesis.  There was significantly increased areas of masslike consolidation in the right lung for example in the peripheral right upper lobe and superior segment of the right lower lobe with extensive new associated nodularity concerning for local malignant recurrence. The patient is also more symptomatic with more dry cough and shortness of breath. I recommended for him to hold any additional treatment with chemotherapy for now. The patient was seen by Dr. Janann August at [facility] and treated with tarlatamab which is a by specific T-cell engager (BiTE) for the last 8 months.  Unfortunately he had evidence for disease progression after this treatment and it was discontinued. He started systemic chemotherapy with Topotecan 1.2 mg/M2 on days 1-5 every 3 weeks with Cosela 240 Mg/M2 IV before the chemotherapy.  First dose January 04, 2024.  Status post 1 cycle.  The start of cycle #2 was delayed by 2 weeks secondary to significant pancytopenia  Extensive Stage Small Cell Lung Cancer Diagnosed in April 2021. Multiple treatments including chemotherapy, immunotherapy, and tarlatan map failed. Recently received radiation to the right hip. Currently on topotecan, with cycle two delayed by two weeks due to myelosuppression. Counts improved today (WBC 4.6, Hemoglobin 9.9, Hematocrit 32.5, Platelets 286,000). Plan to reduce topotecan dose to 1 mg/m to mitigate myelosuppression. Discussed dose adjustment due to slow bone marrow recovery from extensive prior treatments. - Reduce topotecan dose to 1 mg/m starting from cycle two -  Continue Cosela before treatment  Appetite Loss and  Weight Loss Significant weight loss of 60 pounds, with a recent loss of 4-5 pounds over the last three weeks. Discussed Remeron (mirtazapine) for appetite improvement and as an antidepressant. Patient is hesitant due to concerns about constipation. Advised taking Remeron at night with stool softeners to manage constipation. - Start Remeron (mirtazapine) at night - Take stool softeners to manage constipation  Hypotension Reported low blood pressure readings over the past few visits. Current BP is 101/78. Primary care physician has adjusted blood pressure medications. Patient reports feeling off balance but not dizzy or lightheaded. Recent brain MRI was normal. - Monitor blood pressure closely - Adjust blood pressure medications as needed  Follow-up - Proceed with treatment after physical examination - Schedule follow-up appointments as needed.   The patient was advised to call immediately if he has any other concerning symptoms in the interval. The patient voices understanding of current disease status and treatment options and is in agreement with the current care plan.  All questions were answered. The patient knows to call the clinic with any problems, questions or concerns. We can certainly see the patient much sooner if necessary.  The total time spent in the appointment was 30 minutes.  Disclaimer: This note was dictated with voice recognition software. Similar sounding words can inadvertently be transcribed and may not be corrected upon review. Lajuana Matte, MD 02/08/24

## 2024-02-08 NOTE — Patient Instructions (Signed)
 [facility] A DEPT [facility]  Discharge Instructions: Thank you for choosing [facility] to provide your oncology and hematology care.   If you have a lab appointment with the Cancer Center, please go directly to the Cancer Center and check in at the registration area.   Wear comfortable clothing and clothing appropriate for easy access to any Portacath or PICC line.   We strive to give you quality time with your provider. You may need to reschedule your appointment if you arrive late (15 or more minutes).  Arriving late affects you and other patients whose appointments are after yours.  Also, if you miss three or more appointments without notifying the office, you may be dismissed from the clinic at the provider's discretion.      For prescription refill requests, have your pharmacy contact our office and allow 72 hours for refills to be completed.    Today you received the following chemotherapy and/or immunotherapy agents Topotecan, Cosela   To help prevent nausea and vomiting after your treatment, we encourage you to take your nausea medication as directed.  BELOW ARE SYMPTOMS THAT SHOULD BE REPORTED IMMEDIATELY: *FEVER GREATER THAN 100.4 F (38 C) OR HIGHER *CHILLS OR SWEATING *NAUSEA AND VOMITING THAT IS NOT CONTROLLED WITH YOUR NAUSEA MEDICATION *UNUSUAL SHORTNESS OF BREATH *UNUSUAL BRUISING OR BLEEDING *URINARY PROBLEMS (pain or burning when urinating, or frequent urination) *BOWEL PROBLEMS (unusual diarrhea, constipation, pain near the anus) TENDERNESS IN MOUTH AND THROAT WITH OR WITHOUT PRESENCE OF ULCERS (sore throat, sores in mouth, or a toothache) UNUSUAL RASH, SWELLING OR PAIN  UNUSUAL VAGINAL DISCHARGE OR ITCHING   Items with * indicate a potential emergency and should be followed up as soon as possible or go to the Emergency Department if any problems should occur.  Please show the CHEMOTHERAPY ALERT CARD or  IMMUNOTHERAPY ALERT CARD at check-in to the Emergency Department and triage nurse.  Should you have questions after your visit or need to cancel or reschedule your appointment, please contact [facility]  Dept: [phone]  and follow the prompts.  Office hours are 8:00 a.m. to 4:30 p.m. Monday - Friday. Please note that voicemails left after 4:00 p.m. may not be returned until the following business day.  We are closed weekends and major holidays. You have access to a nurse at all times for urgent questions. Please call the main number to the clinic Dept: [phone] and follow the prompts.   For any non-urgent questions, you may also contact your provider using MyChart. We now offer e-Visits for anyone 61 and older to request care online for non-urgent symptoms. For details visit [email or web address].   Also download the MyChart app! Go to the app store, search "MyChart", open the app, select [facility], and log in with your MyChart username and password.

## 2024-02-09 ENCOUNTER — Inpatient Hospital Stay: Payer: Medicare Other

## 2024-02-09 VITALS — BP 108/72 | HR 88 | Temp 98.2°F | Resp 18

## 2024-02-09 DIAGNOSIS — C3411 Malignant neoplasm of upper lobe, right bronchus or lung: Secondary | ICD-10-CM | POA: Diagnosis not present

## 2024-02-09 DIAGNOSIS — Z510 Encounter for antineoplastic radiation therapy: Secondary | ICD-10-CM | POA: Diagnosis not present

## 2024-02-09 DIAGNOSIS — C7951 Secondary malignant neoplasm of bone: Secondary | ICD-10-CM | POA: Diagnosis not present

## 2024-02-09 MED ORDER — SODIUM CHLORIDE 0.9 % IV SOLN
INTRAVENOUS | Status: DC
Start: 2024-02-09 — End: 2024-02-09

## 2024-02-09 MED ORDER — PROCHLORPERAZINE MALEATE 10 MG PO TABS
10.0000 mg | ORAL_TABLET | Freq: Once | ORAL | Status: AC
  Administered 2024-02-09: 10 mg via ORAL

## 2024-02-09 MED ORDER — TOPOTECAN HCL CHEMO INJECTION 4 MG
1.0000 mg/m2 | Freq: Once | INTRAVENOUS | Status: AC
  Administered 2024-02-09: 2.4 mg via INTRAVENOUS
  Filled 2024-02-09: qty 2.4

## 2024-02-09 MED ORDER — DEXTROSE 5 % IV SOLN
240.0000 mg/m2 | Freq: Once | INTRAVENOUS | Status: AC
  Administered 2024-02-09: 600 mg via INTRAVENOUS
  Filled 2024-02-09: qty 40

## 2024-02-09 NOTE — Patient Instructions (Signed)
 [facility] A DEPT [facility]  Discharge Instructions: Thank you for choosing [facility] to provide your oncology and hematology care.   If you have a lab appointment with the Cancer Center, please go directly to the Cancer Center and check in at the registration area.   Wear comfortable clothing and clothing appropriate for easy access to any Portacath or PICC line.   We strive to give you quality time with your provider. You may need to reschedule your appointment if you arrive late (15 or more minutes).  Arriving late affects you and other patients whose appointments are after yours.  Also, if you miss three or more appointments without notifying the office, you may be dismissed from the clinic at the provider's discretion.      For prescription refill requests, have your pharmacy contact our office and allow 72 hours for refills to be completed.    Today you received the following chemotherapy and/or immunotherapy agents Topotecan, Cosela   To help prevent nausea and vomiting after your treatment, we encourage you to take your nausea medication as directed.  BELOW ARE SYMPTOMS THAT SHOULD BE REPORTED IMMEDIATELY: *FEVER GREATER THAN 100.4 F (38 C) OR HIGHER *CHILLS OR SWEATING *NAUSEA AND VOMITING THAT IS NOT CONTROLLED WITH YOUR NAUSEA MEDICATION *UNUSUAL SHORTNESS OF BREATH *UNUSUAL BRUISING OR BLEEDING *URINARY PROBLEMS (pain or burning when urinating, or frequent urination) *BOWEL PROBLEMS (unusual diarrhea, constipation, pain near the anus) TENDERNESS IN MOUTH AND THROAT WITH OR WITHOUT PRESENCE OF ULCERS (sore throat, sores in mouth, or a toothache) UNUSUAL RASH, SWELLING OR PAIN  UNUSUAL VAGINAL DISCHARGE OR ITCHING   Items with * indicate a potential emergency and should be followed up as soon as possible or go to the Emergency Department if any problems should occur.  Please show the CHEMOTHERAPY ALERT CARD or  IMMUNOTHERAPY ALERT CARD at check-in to the Emergency Department and triage nurse.  Should you have questions after your visit or need to cancel or reschedule your appointment, please contact [facility]  Dept: [phone]  and follow the prompts.  Office hours are 8:00 a.m. to 4:30 p.m. Monday - Friday. Please note that voicemails left after 4:00 p.m. may not be returned until the following business day.  We are closed weekends and major holidays. You have access to a nurse at all times for urgent questions. Please call the main number to the clinic Dept: [phone] and follow the prompts.   For any non-urgent questions, you may also contact your provider using MyChart. We now offer e-Visits for anyone 61 and older to request care online for non-urgent symptoms. For details visit [email or web address].   Also download the MyChart app! Go to the app store, search "MyChart", open the app, select [facility], and log in with your MyChart username and password.

## 2024-02-10 ENCOUNTER — Inpatient Hospital Stay: Payer: Medicare Other

## 2024-02-10 VITALS — BP 103/60 | HR 100 | Temp 98.0°F | Resp 18

## 2024-02-10 DIAGNOSIS — C3411 Malignant neoplasm of upper lobe, right bronchus or lung: Secondary | ICD-10-CM

## 2024-02-10 DIAGNOSIS — C7951 Secondary malignant neoplasm of bone: Secondary | ICD-10-CM | POA: Diagnosis not present

## 2024-02-10 DIAGNOSIS — Z510 Encounter for antineoplastic radiation therapy: Secondary | ICD-10-CM | POA: Diagnosis not present

## 2024-02-10 MED ORDER — SODIUM CHLORIDE 0.9% FLUSH: 3.0000 mL | INTRAVENOUS | Status: DC | PRN

## 2024-02-10 MED ORDER — SODIUM CHLORIDE 0.9% FLUSH
10.0000 mL | INTRAVENOUS | Status: DC | PRN
  Administered 2024-02-10: 10 mL

## 2024-02-10 MED ORDER — PROCHLORPERAZINE MALEATE 10 MG PO TABS
10.0000 mg | ORAL_TABLET | Freq: Once | ORAL | Status: AC
  Administered 2024-02-10: 10 mg via ORAL
  Filled 2024-02-10: qty 1

## 2024-02-10 MED ORDER — TRILACICLIB DIHYDROCHLORIDE INJECTION 300 MG
240.0000 mg/m2 | Freq: Once | INTRAVENOUS | Status: AC
  Administered 2024-02-10: 600 mg via INTRAVENOUS
  Filled 2024-02-10: qty 40

## 2024-02-10 MED ORDER — HEPARIN SOD (PORK) LOCK FLUSH 100 UNIT/ML IV SOLN
500.0000 [IU] | Freq: Once | INTRAVENOUS | Status: AC | PRN
  Administered 2024-02-10: 500 [IU]

## 2024-02-10 MED ORDER — ALTEPLASE 2 MG IJ SOLR: 2.0000 mg | Freq: Once | INTRAMUSCULAR | Status: DC | PRN

## 2024-02-10 MED ORDER — HEPARIN SOD (PORK) LOCK FLUSH 100 UNIT/ML IV SOLN
250.0000 [IU] | Freq: Once | INTRAVENOUS | Status: DC | PRN
Start: 2024-02-10 — End: 2024-02-10

## 2024-02-10 MED ORDER — TOPOTECAN HCL CHEMO INJECTION 4 MG
1.0000 mg/m2 | Freq: Once | INTRAVENOUS | Status: AC
  Administered 2024-02-10: 2.4 mg via INTRAVENOUS
  Filled 2024-02-10: qty 2.4

## 2024-02-10 MED ORDER — SODIUM CHLORIDE 0.9 % IV SOLN: INTRAVENOUS | Status: DC

## 2024-02-10 NOTE — Patient Instructions (Signed)
 [facility] A DEPT [facility]  Discharge Instructions: Thank you for choosing [facility] to provide your oncology and hematology care.   If you have a lab appointment with the Cancer Center, please go directly to the Cancer Center and check in at the registration area.   Wear comfortable clothing and clothing appropriate for easy access to any Portacath or PICC line.   We strive to give you quality time with your provider. You may need to reschedule your appointment if you arrive late (15 or more minutes).  Arriving late affects you and other patients whose appointments are after yours.  Also, if you miss three or more appointments without notifying the office, you may be dismissed from the clinic at the provider's discretion.      For prescription refill requests, have your pharmacy contact our office and allow 72 hours for refills to be completed.    Today you received the following chemotherapy and/or immunotherapy agents Topotecan, Cosela   To help prevent nausea and vomiting after your treatment, we encourage you to take your nausea medication as directed.  BELOW ARE SYMPTOMS THAT SHOULD BE REPORTED IMMEDIATELY: *FEVER GREATER THAN 100.4 F (38 C) OR HIGHER *CHILLS OR SWEATING *NAUSEA AND VOMITING THAT IS NOT CONTROLLED WITH YOUR NAUSEA MEDICATION *UNUSUAL SHORTNESS OF BREATH *UNUSUAL BRUISING OR BLEEDING *URINARY PROBLEMS (pain or burning when urinating, or frequent urination) *BOWEL PROBLEMS (unusual diarrhea, constipation, pain near the anus) TENDERNESS IN MOUTH AND THROAT WITH OR WITHOUT PRESENCE OF ULCERS (sore throat, sores in mouth, or a toothache) UNUSUAL RASH, SWELLING OR PAIN  UNUSUAL VAGINAL DISCHARGE OR ITCHING   Items with * indicate a potential emergency and should be followed up as soon as possible or go to the Emergency Department if any problems should occur.  Please show the CHEMOTHERAPY ALERT CARD or  IMMUNOTHERAPY ALERT CARD at check-in to the Emergency Department and triage nurse.  Should you have questions after your visit or need to cancel or reschedule your appointment, please contact [facility]  Dept: [phone]  and follow the prompts.  Office hours are 8:00 a.m. to 4:30 p.m. Monday - Friday. Please note that voicemails left after 4:00 p.m. may not be returned until the following business day.  We are closed weekends and major holidays. You have access to a nurse at all times for urgent questions. Please call the main number to the clinic Dept: [phone] and follow the prompts.   For any non-urgent questions, you may also contact your provider using MyChart. We now offer e-Visits for anyone 61 and older to request care online for non-urgent symptoms. For details visit [email or web address].   Also download the MyChart app! Go to the app store, search "MyChart", open the app, select [facility], and log in with your MyChart username and password.

## 2024-02-11 ENCOUNTER — Inpatient Hospital Stay: Payer: Medicare Other

## 2024-02-11 ENCOUNTER — Encounter: Payer: Self-pay | Admitting: Internal Medicine

## 2024-02-11 VITALS — BP 108/62 | HR 88 | Temp 98.2°F | Resp 17 | Wt 236.0 lb

## 2024-02-11 DIAGNOSIS — Z510 Encounter for antineoplastic radiation therapy: Secondary | ICD-10-CM | POA: Diagnosis not present

## 2024-02-11 DIAGNOSIS — C7951 Secondary malignant neoplasm of bone: Secondary | ICD-10-CM | POA: Diagnosis not present

## 2024-02-11 DIAGNOSIS — C3411 Malignant neoplasm of upper lobe, right bronchus or lung: Secondary | ICD-10-CM

## 2024-02-11 MED ORDER — HEPARIN SOD (PORK) LOCK FLUSH 100 UNIT/ML IV SOLN
500.0000 [IU] | Freq: Once | INTRAVENOUS | Status: AC | PRN
  Administered 2024-02-11: 500 [IU]

## 2024-02-11 MED ORDER — SODIUM CHLORIDE 0.9 % IV SOLN: INTRAVENOUS | Status: DC

## 2024-02-11 MED ORDER — TOPOTECAN HCL CHEMO INJECTION 4 MG
1.0000 mg/m2 | Freq: Once | INTRAVENOUS | Status: AC
  Administered 2024-02-11: 2.4 mg via INTRAVENOUS
  Filled 2024-02-11: qty 2.4

## 2024-02-11 MED ORDER — PROCHLORPERAZINE MALEATE 10 MG PO TABS
10.0000 mg | ORAL_TABLET | Freq: Once | ORAL | Status: AC
  Administered 2024-02-11: 10 mg via ORAL
  Filled 2024-02-11: qty 1

## 2024-02-11 MED ORDER — SODIUM CHLORIDE 0.9% FLUSH
10.0000 mL | INTRAVENOUS | Status: DC | PRN
  Administered 2024-02-11: 10 mL

## 2024-02-11 MED ORDER — TRILACICLIB DIHYDROCHLORIDE INJECTION 300 MG
240.0000 mg/m2 | Freq: Once | INTRAVENOUS | Status: AC
Start: 2024-02-11 — End: 2024-02-11
  Administered 2024-02-11: 600 mg via INTRAVENOUS
  Filled 2024-02-11: qty 40

## 2024-02-11 NOTE — Patient Instructions (Signed)
 [facility] - A DEPT OF [facility]  Discharge Instructions: Thank you for choosing [facility] to provide your oncology and hematology care.   If you have a lab appointment with the Cancer Center, please go directly to the Cancer Center and check in at the registration area.   Wear comfortable clothing and clothing appropriate for easy access to any Portacath or PICC line.   We strive to give you quality time with your provider. You may need to reschedule your appointment if you arrive late (15 or more minutes).  Arriving late affects you and other patients whose appointments are after yours.  Also, if you miss three or more appointments without notifying the office, you may be dismissed from the clinic at the provider's discretion.      For prescription refill requests, have your pharmacy contact our office and allow 72 hours for refills to be completed.    Today you received the following chemotherapy and/or immunotherapy agents: Topotecan.      To help prevent nausea and vomiting after your treatment, we encourage you to take your nausea medication as directed.  BELOW ARE SYMPTOMS THAT SHOULD BE REPORTED IMMEDIATELY: *FEVER GREATER THAN 100.4 F (38 C) OR HIGHER *CHILLS OR SWEATING *NAUSEA AND VOMITING THAT IS NOT CONTROLLED WITH YOUR NAUSEA MEDICATION *UNUSUAL SHORTNESS OF BREATH *UNUSUAL BRUISING OR BLEEDING *URINARY PROBLEMS (pain or burning when urinating, or frequent urination) *BOWEL PROBLEMS (unusual diarrhea, constipation, pain near the anus) TENDERNESS IN MOUTH AND THROAT WITH OR WITHOUT PRESENCE OF ULCERS (sore throat, sores in mouth, or a toothache) UNUSUAL RASH, SWELLING OR PAIN  UNUSUAL VAGINAL DISCHARGE OR ITCHING   Items with * indicate a potential emergency and should be followed up as soon as possible or go to the Emergency Department if any problems should occur.  Please show the CHEMOTHERAPY ALERT CARD or IMMUNOTHERAPY  ALERT CARD at check-in to the Emergency Department and triage nurse.  Should you have questions after your visit or need to cancel or reschedule your appointment, please contact [facility] - A DEPT OF [facility]  Dept: [phone]  and follow the prompts.  Office hours are 8:00 a.m. to 4:30 p.m. Monday - Friday. Please note that voicemails left after 4:00 p.m. may not be returned until the following business day.  We are closed weekends and major holidays. You have access to a nurse at all times for urgent questions. Please call the main number to the clinic Dept: [phone] and follow the prompts.   For any non-urgent questions, you may also contact your provider using MyChart. We now offer e-Visits for anyone 3 and older to request care online for non-urgent symptoms. For details visit [email or web address].   Also download the MyChart app! Go to the app store, search "MyChart", open the app, select [place], and log in with your MyChart username and password.

## 2024-02-12 ENCOUNTER — Inpatient Hospital Stay: Payer: Medicare Other

## 2024-02-12 ENCOUNTER — Other Ambulatory Visit: Payer: Self-pay | Admitting: Primary Care

## 2024-02-12 VITALS — BP 118/76 | HR 98 | Temp 98.0°F | Resp 16

## 2024-02-12 DIAGNOSIS — C3411 Malignant neoplasm of upper lobe, right bronchus or lung: Secondary | ICD-10-CM

## 2024-02-12 DIAGNOSIS — C7951 Secondary malignant neoplasm of bone: Secondary | ICD-10-CM | POA: Diagnosis not present

## 2024-02-12 DIAGNOSIS — E1165 Type 2 diabetes mellitus with hyperglycemia: Secondary | ICD-10-CM

## 2024-02-12 DIAGNOSIS — Z510 Encounter for antineoplastic radiation therapy: Secondary | ICD-10-CM | POA: Diagnosis not present

## 2024-02-12 MED ORDER — PROCHLORPERAZINE MALEATE 10 MG PO TABS
10.0000 mg | ORAL_TABLET | Freq: Once | ORAL | Status: AC
  Administered 2024-02-12: 10 mg via ORAL
  Filled 2024-02-12: qty 1

## 2024-02-12 MED ORDER — SODIUM CHLORIDE 0.9% FLUSH
10.0000 mL | INTRAVENOUS | Status: DC | PRN
  Administered 2024-02-12: 10 mL

## 2024-02-12 MED ORDER — HEPARIN SOD (PORK) LOCK FLUSH 100 UNIT/ML IV SOLN
500.0000 [IU] | Freq: Once | INTRAVENOUS | Status: AC | PRN
  Administered 2024-02-12: 500 [IU]

## 2024-02-12 MED ORDER — SODIUM CHLORIDE 0.9 % IV SOLN: INTRAVENOUS | Status: DC

## 2024-02-12 MED ORDER — TOPOTECAN HCL CHEMO INJECTION 4 MG
1.0000 mg/m2 | Freq: Once | INTRAVENOUS | Status: AC
  Administered 2024-02-12: 2.4 mg via INTRAVENOUS
  Filled 2024-02-12: qty 2.4

## 2024-02-12 MED ORDER — TRILACICLIB DIHYDROCHLORIDE INJECTION 300 MG
240.0000 mg/m2 | Freq: Once | INTRAVENOUS | Status: AC
  Administered 2024-02-12: 600 mg via INTRAVENOUS
  Filled 2024-02-12: qty 40

## 2024-02-12 NOTE — Patient Instructions (Signed)
 [facility] - A DEPT OF [facility]  Discharge Instructions: Thank you for choosing [facility] to provide your oncology and hematology care.   If you have a lab appointment with the Cancer Center, please go directly to the Cancer Center and check in at the registration area.   Wear comfortable clothing and clothing appropriate for easy access to any Portacath or PICC line.   We strive to give you quality time with your provider. You may need to reschedule your appointment if you arrive late (15 or more minutes).  Arriving late affects you and other patients whose appointments are after yours.  Also, if you miss three or more appointments without notifying the office, you may be dismissed from the clinic at the provider's discretion.      For prescription refill requests, have your pharmacy contact our office and allow 72 hours for refills to be completed.    Today you received the following chemotherapy and/or immunotherapy agents: Cosela/Topotecan      To help prevent nausea and vomiting after your treatment, we encourage you to take your nausea medication as directed.  BELOW ARE SYMPTOMS THAT SHOULD BE REPORTED IMMEDIATELY: *FEVER GREATER THAN 100.4 F (38 C) OR HIGHER *CHILLS OR SWEATING *NAUSEA AND VOMITING THAT IS NOT CONTROLLED WITH YOUR NAUSEA MEDICATION *UNUSUAL SHORTNESS OF BREATH *UNUSUAL BRUISING OR BLEEDING *URINARY PROBLEMS (pain or burning when urinating, or frequent urination) *BOWEL PROBLEMS (unusual diarrhea, constipation, pain near the anus) TENDERNESS IN MOUTH AND THROAT WITH OR WITHOUT PRESENCE OF ULCERS (sore throat, sores in mouth, or a toothache) UNUSUAL RASH, SWELLING OR PAIN  UNUSUAL VAGINAL DISCHARGE OR ITCHING   Items with * indicate a potential emergency and should be followed up as soon as possible or go to the Emergency Department if any problems should occur.  Please show the CHEMOTHERAPY ALERT CARD or  IMMUNOTHERAPY ALERT CARD at check-in to the Emergency Department and triage nurse.  Should you have questions after your visit or need to cancel or reschedule your appointment, please contact [facility] - [facility] DE[facility]  Dept: [phone]  and follow the prompts.  Office hours are 8:00 a.m. to 4:30 p.m. Monday - Friday. Please note that voicemails left after 4:00 p.m. may not be returned until the following business day.  We are closed weekends and major holidays. You have access to a nurse at all times for urgent questions. Please call the main number to the clinic Dept: [phone] and follow the prompts.   For any non-urgent questions, you may also contact your provider using MyChart. We now offer e-Visits for anyone 91 and older to request care online for non-urgent symptoms. For details visit [email or web address].   Also download the MyChart app! Go to the app store, search "MyChart", open the app, select [place], and log in with your MyChart username and password.

## 2024-02-14 ENCOUNTER — Other Ambulatory Visit: Payer: Self-pay | Admitting: Primary Care

## 2024-02-14 DIAGNOSIS — E1169 Type 2 diabetes mellitus with other specified complication: Secondary | ICD-10-CM

## 2024-02-15 ENCOUNTER — Ambulatory Visit: Payer: Medicare Other

## 2024-02-15 ENCOUNTER — Inpatient Hospital Stay: Payer: Medicare Other

## 2024-02-15 ENCOUNTER — Other Ambulatory Visit: Payer: Medicare Other

## 2024-02-15 ENCOUNTER — Inpatient Hospital Stay: Attending: Internal Medicine

## 2024-02-15 ENCOUNTER — Ambulatory Visit: Payer: Medicare Other | Admitting: Internal Medicine

## 2024-02-15 ENCOUNTER — Encounter: Payer: Self-pay | Admitting: Internal Medicine

## 2024-02-15 DIAGNOSIS — Z515 Encounter for palliative care: Secondary | ICD-10-CM | POA: Diagnosis not present

## 2024-02-15 DIAGNOSIS — Z86711 Personal history of pulmonary embolism: Secondary | ICD-10-CM | POA: Diagnosis not present

## 2024-02-15 DIAGNOSIS — D6481 Anemia due to antineoplastic chemotherapy: Secondary | ICD-10-CM

## 2024-02-15 DIAGNOSIS — M545 Low back pain, unspecified: Secondary | ICD-10-CM | POA: Diagnosis not present

## 2024-02-15 DIAGNOSIS — C787 Secondary malignant neoplasm of liver and intrahepatic bile duct: Secondary | ICD-10-CM | POA: Insufficient documentation

## 2024-02-15 DIAGNOSIS — I10 Essential (primary) hypertension: Secondary | ICD-10-CM | POA: Insufficient documentation

## 2024-02-15 DIAGNOSIS — Z7901 Long term (current) use of anticoagulants: Secondary | ICD-10-CM | POA: Diagnosis not present

## 2024-02-15 DIAGNOSIS — C3411 Malignant neoplasm of upper lobe, right bronchus or lung: Secondary | ICD-10-CM | POA: Insufficient documentation

## 2024-02-15 DIAGNOSIS — E119 Type 2 diabetes mellitus without complications: Secondary | ICD-10-CM | POA: Diagnosis not present

## 2024-02-15 DIAGNOSIS — Z5111 Encounter for antineoplastic chemotherapy: Secondary | ICD-10-CM | POA: Diagnosis not present

## 2024-02-15 LAB — CBC WITH DIFFERENTIAL (CANCER CENTER ONLY)
Abs Immature Granulocytes: 0.02 10*3/uL (ref 0.00–0.07)
Basophils Absolute: 0 10*3/uL (ref 0.0–0.1)
Basophils Relative: 1 %
Eosinophils Absolute: 0 10*3/uL (ref 0.0–0.5)
Eosinophils Relative: 0 %
HCT: 28.4 % — ABNORMAL LOW (ref 39.0–52.0)
Hemoglobin: 8.8 g/dL — ABNORMAL LOW (ref 13.0–17.0)
Immature Granulocytes: 1 %
Lymphocytes Relative: 14 %
Lymphs Abs: 0.5 10*3/uL — ABNORMAL LOW (ref 0.7–4.0)
MCH: 29.1 pg (ref 26.0–34.0)
MCHC: 31 g/dL (ref 30.0–36.0)
MCV: 94 fL (ref 80.0–100.0)
Monocytes Absolute: 0.1 10*3/uL (ref 0.1–1.0)
Monocytes Relative: 3 %
Neutro Abs: 2.7 10*3/uL (ref 1.7–7.7)
Neutrophils Relative %: 81 %
Platelet Count: 121 10*3/uL — ABNORMAL LOW (ref 150–400)
RBC: 3.02 MIL/uL — ABNORMAL LOW (ref 4.22–5.81)
RDW: 16.3 % — ABNORMAL HIGH (ref 11.5–15.5)
WBC Count: 3.4 10*3/uL — ABNORMAL LOW (ref 4.0–10.5)
nRBC: 0 % (ref 0.0–0.2)

## 2024-02-15 LAB — CMP (CANCER CENTER ONLY)
ALT: 9 U/L (ref 0–44)
AST: 14 U/L — ABNORMAL LOW (ref 15–41)
Albumin: 3.5 g/dL (ref 3.5–5.0)
Alkaline Phosphatase: 63 U/L (ref 38–126)
Anion gap: 7 (ref 5–15)
BUN: 12 mg/dL (ref 8–23)
CO2: 30 mmol/L (ref 22–32)
Calcium: 8.8 mg/dL — ABNORMAL LOW (ref 8.9–10.3)
Chloride: 101 mmol/L (ref 98–111)
Creatinine: 1.03 mg/dL (ref 0.61–1.24)
GFR, Estimated: >60 mL/min (ref >60)
Glucose, Bld: 150 mg/dL — ABNORMAL HIGH (ref 70–99)
Potassium: 4.8 mmol/L (ref 3.5–5.1)
Sodium: 138 mmol/L (ref 135–145)
Total Bilirubin: 0.7 mg/dL (ref 0.0–1.2)
Total Protein: 6.2 g/dL — ABNORMAL LOW (ref 6.5–8.1)

## 2024-02-15 LAB — SAMPLE TO BLOOD BANK

## 2024-02-15 MED ORDER — HEPARIN SOD (PORK) LOCK FLUSH 100 UNIT/ML IV SOLN
500.0000 [IU] | Freq: Once | INTRAVENOUS | Status: AC
Start: 2024-02-15 — End: ?

## 2024-02-15 MED ORDER — SODIUM CHLORIDE 0.9% FLUSH: 10.0000 mL | Freq: Once | INTRAVENOUS | Status: AC

## 2024-02-16 ENCOUNTER — Encounter: Payer: Self-pay | Admitting: Internal Medicine

## 2024-02-16 ENCOUNTER — Ambulatory Visit: Payer: Medicare Other

## 2024-02-17 ENCOUNTER — Ambulatory Visit: Payer: Medicare Other

## 2024-02-17 DIAGNOSIS — C801 Malignant (primary) neoplasm, unspecified: Secondary | ICD-10-CM | POA: Diagnosis not present

## 2024-02-17 DIAGNOSIS — C3411 Malignant neoplasm of upper lobe, right bronchus or lung: Secondary | ICD-10-CM | POA: Diagnosis not present

## 2024-02-17 DIAGNOSIS — G4733 Obstructive sleep apnea (adult) (pediatric): Secondary | ICD-10-CM | POA: Diagnosis not present

## 2024-02-17 DIAGNOSIS — J449 Chronic obstructive pulmonary disease, unspecified: Secondary | ICD-10-CM | POA: Diagnosis not present

## 2024-02-18 ENCOUNTER — Ambulatory Visit: Payer: Medicare Other

## 2024-02-19 ENCOUNTER — Ambulatory Visit: Payer: Medicare Other

## 2024-02-19 ENCOUNTER — Encounter: Payer: Self-pay | Admitting: Internal Medicine

## 2024-02-22 ENCOUNTER — Inpatient Hospital Stay: Payer: Medicare Other

## 2024-02-22 ENCOUNTER — Other Ambulatory Visit: Payer: Self-pay | Admitting: Nurse Practitioner

## 2024-02-22 ENCOUNTER — Telehealth: Payer: Self-pay | Admitting: Medical Oncology

## 2024-02-22 ENCOUNTER — Inpatient Hospital Stay

## 2024-02-22 ENCOUNTER — Telehealth: Payer: Self-pay | Admitting: Internal Medicine

## 2024-02-22 ENCOUNTER — Ambulatory Visit: Payer: Medicare Other

## 2024-02-22 ENCOUNTER — Other Ambulatory Visit: Payer: Medicare Other

## 2024-02-22 ENCOUNTER — Ambulatory Visit: Payer: Medicare Other | Admitting: Internal Medicine

## 2024-02-22 DIAGNOSIS — G893 Neoplasm related pain (acute) (chronic): Secondary | ICD-10-CM

## 2024-02-22 DIAGNOSIS — Z515 Encounter for palliative care: Secondary | ICD-10-CM | POA: Diagnosis not present

## 2024-02-22 DIAGNOSIS — C3411 Malignant neoplasm of upper lobe, right bronchus or lung: Secondary | ICD-10-CM | POA: Diagnosis not present

## 2024-02-22 DIAGNOSIS — C787 Secondary malignant neoplasm of liver and intrahepatic bile duct: Secondary | ICD-10-CM | POA: Diagnosis not present

## 2024-02-22 DIAGNOSIS — M545 Low back pain, unspecified: Secondary | ICD-10-CM | POA: Diagnosis not present

## 2024-02-22 DIAGNOSIS — E119 Type 2 diabetes mellitus without complications: Secondary | ICD-10-CM | POA: Diagnosis not present

## 2024-02-22 DIAGNOSIS — Z5111 Encounter for antineoplastic chemotherapy: Secondary | ICD-10-CM | POA: Diagnosis not present

## 2024-02-22 LAB — CBC WITH DIFFERENTIAL (CANCER CENTER ONLY)
Abs Immature Granulocytes: 0.01 10*3/uL (ref 0.00–0.07)
Basophils Absolute: 0 10*3/uL (ref 0.0–0.1)
Basophils Relative: 1 %
Eosinophils Absolute: 0 10*3/uL (ref 0.0–0.5)
Eosinophils Relative: 2 %
HCT: 28.4 % — ABNORMAL LOW (ref 39.0–52.0)
Hemoglobin: 8.8 g/dL — ABNORMAL LOW (ref 13.0–17.0)
Immature Granulocytes: 1 %
Lymphocytes Relative: 23 %
Lymphs Abs: 0.4 10*3/uL — ABNORMAL LOW (ref 0.7–4.0)
MCH: 29.2 pg (ref 26.0–34.0)
MCHC: 31 g/dL (ref 30.0–36.0)
MCV: 94.4 fL (ref 80.0–100.0)
Monocytes Absolute: 0.3 10*3/uL (ref 0.1–1.0)
Monocytes Relative: 17 %
Neutro Abs: 1.1 10*3/uL — ABNORMAL LOW (ref 1.7–7.7)
Neutrophils Relative %: 56 %
Platelet Count: 46 10*3/uL — ABNORMAL LOW (ref 150–400)
RBC: 3.01 MIL/uL — ABNORMAL LOW (ref 4.22–5.81)
RDW: 17.2 % — ABNORMAL HIGH (ref 11.5–15.5)
WBC Count: 1.9 10*3/uL — ABNORMAL LOW (ref 4.0–10.5)
nRBC: 0 % (ref 0.0–0.2)

## 2024-02-22 LAB — CMP (CANCER CENTER ONLY)
ALT: 12 U/L (ref 0–44)
AST: 16 U/L (ref 15–41)
Albumin: 3.5 g/dL (ref 3.5–5.0)
Alkaline Phosphatase: 78 U/L (ref 38–126)
Anion gap: 6 (ref 5–15)
BUN: 12 mg/dL (ref 8–23)
CO2: 31 mmol/L (ref 22–32)
Calcium: 8.5 mg/dL — ABNORMAL LOW (ref 8.9–10.3)
Chloride: 104 mmol/L (ref 98–111)
Creatinine: 1.09 mg/dL (ref 0.61–1.24)
GFR, Estimated: >60 mL/min (ref >60)
Glucose, Bld: 140 mg/dL — ABNORMAL HIGH (ref 70–99)
Potassium: 5.2 mmol/L — ABNORMAL HIGH (ref 3.5–5.1)
Sodium: 141 mmol/L (ref 135–145)
Total Bilirubin: 0.4 mg/dL (ref 0.0–1.2)
Total Protein: 6 g/dL — ABNORMAL LOW (ref 6.5–8.1)

## 2024-02-22 MED ORDER — MORPHINE SULFATE ER 30 MG PO TBCR
30.0000 mg | EXTENDED_RELEASE_TABLET | Freq: Two times a day (BID) | ORAL | 0 refills | Status: DC
Start: 2024-02-22 — End: 2024-03-25

## 2024-02-22 NOTE — Telephone Encounter (Signed)
.  PLTS 45 kHe reported he has Pink tinged mucous when he blows his nose.  Bleeding precautions reviewed with pt He voiced understanding .

## 2024-02-22 NOTE — Telephone Encounter (Signed)
 CRITICAL VALUE STICKER  CRITICAL VALUE:plts 46k  RECEIVER (on-site recipient of call):Adam Wise  DATE & TIME NOTIFIED: 02/22/2024  MESSENGER (representative from lab):Mindi Junker  MD NOTIFIED: Arbutus Ped  TIME OF NOTIFICATION:1240  RESPONSE:  Bleeding precautions reviewed with pt.

## 2024-02-23 ENCOUNTER — Ambulatory Visit: Payer: Medicare Other

## 2024-02-23 NOTE — Progress Notes (Signed)
 [name] OFFICE PROGRESS NOTE  Adam Wise, Adam Abraham, PA-C [address] [place] [address]  DIAGNOSIS: Relapsed extensive stage (T4, N3, M1c)  small cell lung cancer diagnosed in April 2021 and presented with large right upper lobe/right hilar mass with ipsilateral and contralateral mediastinal and right supraclavicular lymphadenopathy in addition to multiple liver lesions. He was initially diagnosed in December 2021.   PRIOR THERAPY: 1) Palliative radiotherapy to the right upper lobe lung mass under the care of Dr. Mitzi Hansen. 2) Systemic chemotherapy with carboplatin for AUC of 5 on day 1, etoposide 100 mg/M2 on days 1, 2 and 3 in addition to Imfinzi 1500 mg IV every 3 weeks with chemotherapy then every 4 weeks for maintenance if the patient has no evidence for progression.  He will also receive Cosela 240 mg/m2 on the days of the chemotherapy.  Status post 9 cycles.  Starting from cycle #5 the patient will be on maintenance treatment with immunotherapy with Imfinzi 1500 mg IV every 4 weeks. Last dose of chemotherapy was given on November 13, 2020. This treatment was discontinued secondary to disease progression 3) Systemic chemotherapy with carboplatin for AUC of 5 on day 1, etoposide 100 mg/M2 on days 1, 2 and 3, Tecentriq 1200 mg IV every 3 weeks as well as Cosela 250 mg/M2 on the days of the chemotherapy every 3 weeks.  First dose December 18, 2020.  Status post 8 cycles. 4) Zepzelca (lurbinectedin) 3.2 mgm2 IV every 3 weeks. Last dose on 01/23/22. Status post 12 cycles.  5) Palliative systemic chemotherapy with irinotecan 65 mg/m2 on days 1 and 8 IV every 3 weeks.  Status post 3 cycles.  Last dose was given April 01, 2022 discontinued secondary to disease progression. 6) SBRT to the progressive liver lesions under the care of Dr. Mitzi Hansen.  Last fraction January 27, 2023. 7) systemic chemotherapy with carboplatin for AUC of 5 on day 1, etoposide 100 Mg/M2 on days 1, 2 and  3 with Cosela before the chemotherapy.  First dose expected to start on 05/28/2022.  Status post 15 cycles.  Starting from cycle #5 his carboplatin will be reduced to AUC of 4 and 2 etoposide 80 Mg/M2.  Last dose was giving April 07, 2023 discontinued for suspicious disease progression. 8) tarlatamab (6/17-12/29) at [facility] 9) palliative radiation to the right hip under the care of Dr. Mitzi Hansen, plan to occur in January 2025  CURRENT THERAPY: 1) Topotecan 1.2 mg/M2 on days 1-5 every 3 weeks with Cosela 240 Mg/M2 IV before the chemotherapy.  First dose January 11, 2024. Status post 2 cycles.   INTERVAL HISTORY: Adam Wise 73 y.o. male returns to the clinic today for a follow-up visit accompanied by his wife.  The patient has extensive stage small cell lung cancer.  He has been through several lines of therapy.  He is currently on chemotherapy with topotecan.   He tolerates treatment fairly well except he has cytopenias on weekly labs.  Last week he had thrombocytopenia.  He denied any abnormal bleeding or bruising.  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   He denies any major changes in his health since he was last seen. He actually has been feeling better recently. He denies any signs and symptoms of infection including fever, chills, nasal congestion, unusual cough, skin infections, diarrhea, or dysuria. He has had some mild sore throat secondary to his CPAP.   He follows closely with palliative care due to right hip pain.  His hip pain is better.  It is not completely gone but it is better than prior and he is able to be more active and he can walk further without pain.  He is hardly needing to take his oxycodone for breakthrough pain.  He does still take his long-acting morphine twice a day.  He has Remeron for an appetite stimulant.  He has lost weight over the course of 7 months.  Overall he has been eating better and has gained weight since last being seen. He denies any fever, chills, or night sweats.   For breathing, his shortness of breath is still present but "not as bad as it was". He denies any hemoptysis or chest pain.  Denies any nausea, vomiting, or diarrhea.  Sometimes has constipation which has improved.  He reports some taste alterations and does have some evidence of oral thrush.  He denies any rashes or skin changes. Denies any headache or visual changes.  He is here today for evaluation repeat blood work before undergoing cycle #3    MEDICAL HISTORY: Past Medical History:  Diagnosis Date   Acute on chronic respiratory failure with hypoxia (HCC) 10/12/2021   Chickenpox    Chronic knee pain    Chronic low back pain    COPD exacerbation (HCC) 10/11/2021   COPD with exacerbation (HCC) 10/12/2021   Coronary artery calcification seen on CAT scan 11/2021   Coronary CTA 11/27/2021: Moderate to large right pleural effusion and compressive atelectasis right lung base. => Coronary Calcium Score 657.  Diffuse RCA calcification.  Minimal mild disease in the LAD and diagonal branches. == Overall limited study.  Notable artifact.   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 He has disease progression after the first line of chemotherapy in December 2021.   Testosterone deficiency    Type 2 diabetes mellitus (HCC)     ALLERGIES:  is allergic to bupropion and hydrochlorothiazide.  MEDICATIONS:  Current Outpatient Medications  Medication Sig Dispense Refill   albuterol (VENTOLIN HFA) 108 (90 Base) MCG/ACT inhaler INHALE 2 PUFFS INTO THE LUNGS EVERY 4 (FOUR) HOURS AS NEEDED FOR SHORTNESS OF  BREATH 18 each 0   amLODipine (NORVASC) 10 MG tablet Take 0.5 tablets (5 mg total) by mouth daily. TAKE 1 TABLET BY MOUTH EVERY DAY for blood pressure.     B-D 3CC LUER-LOK SYR 22GX1" 22G X 1" 3 ML MISC USE AS INSTRUCTED FOR TESTOSTERONE INJECTION EVERY 2 WEEKS 10 each 2   Black Pepper-Turmeric (TURMERIC COMPLEX/BLACK PEPPER PO) Take 1 tablet by mouth in the morning and at bedtime.     Coenzyme Q10 (COQ10) 200 MG CAPS Take 200 mg by mouth at bedtime.     cyclobenzaprine (FLEXERIL) 5 MG tablet TAKE 1 TABLET BY MOUTH AT BEDTIME AS NEEDED FOR MUSCLE SPASMS. 90 tablet 0   docusate sodium (COLACE) 100 MG capsule Take 1 capsule (100 mg total) by mouth every 12 (twelve) hours. (Patient not taking: Reported on 01/28/2024) 60 capsule 0   ELIQUIS 5 MG TABS tablet TAKE 1 TABLET BY MOUTH TWICE A DAY 180 tablet 2   esomeprazole (  NEXIUM) 20 MG capsule Take 20 mg by mouth in the morning.     gabapentin (NEURONTIN) 300 MG capsule TAKE 1 CAPSULE (300 MG TOTAL) BY MOUTH 2 (TWO) TIMES DAILY. FOR BACK PAIN. 180 capsule 1   glucose blood (ONETOUCH ULTRA) test strip USE UP TO 4 TIMES DAILY AS DIRECTED 400 strip 5   Insulin Pen Needle (BD PEN NEEDLE NANO U/F) 32G X 4 MM MISC Use with insulin as prescribed Dx Code: E11.9 100 each 3   ipratropium-albuterol (DUONEB) 0.5-2.5 (3) MG/3ML SOLN Take 3 mLs by nebulization every 4 (four) hours while awake for 3 days, THEN 3 mLs every 4 (four) hours as needed (shortness of breath or wheezing). 360 mL 0   KRILL OIL PO Take 1 capsule by mouth daily.     Lancets MISC USE UP TO 3 TIMES DAILY AS DIRECTED 100 each 2   lidocaine (LIDODERM) 5 % Place 1 patch onto the skin daily. Remove & Discard patch within 12 hours or as directed by MD 30 patch 0   losartan (COZAAR) 50 MG tablet TAKE 1 TABLET (50 MG TOTAL) BY MOUTH DAILY. FOR BLOOD PRESSURE. 90 tablet 3   Melatonin 10 MG CAPS Take 10 mg by mouth at bedtime as needed (for sleep). (Patient not taking: Reported on 01/28/2024)     metFORMIN  (GLUCOPHAGE) 1000 MG tablet TAKE 1 TABLET (1,000 MG TOTAL) BY MOUTH 2 (TWO) TIMES DAILY WITH A MEAL. FOR DIABETES 180 tablet 1   mirtazapine (REMERON) 15 MG tablet Take 1 tablet (15 mg total) by mouth at bedtime. 30 tablet 2   morphine (MS CONTIN) 30 MG 12 hr tablet Take 1 tablet (30 mg total) by mouth 2 (two) times daily. 60 tablet 0   Multiple Vitamin (MULTI-VITAMINS) TABS Take 1 tablet by mouth daily with breakfast.     Naloxone HCl 3 MG/0.1ML LIQD Place 1 spray into both nostrils once. For known/suspected opiod overdose. Every 2-3 minutes in alternating nostril till EMS arrives.     ondansetron (ZOFRAN) 4 MG tablet Take 1 tablet (4 mg total) by mouth every 8 (eight) hours as needed for nausea or vomiting. 30 tablet 0   oxyCODONE-acetaminophen (PERCOCET) 10-325 MG tablet Take 1 tablet by mouth every 4 (four) hours as needed.     pioglitazone (ACTOS) 45 MG tablet TAKE 1 TABLET (45 MG TOTAL) BY MOUTH DAILY. FOR DIABETES. 90 tablet 1   pravastatin (PRAVACHOL) 40 MG tablet TAKE 1 TABLET BY MOUTH EVERY DAY IN THE EVENING FOR CHOLESTEROL 90 tablet 3   PRESCRIPTION MEDICATION CPAP- At bedtime     testosterone cypionate (DEPOTESTOSTERONE CYPIONATE) 200 MG/ML injection Inject 200 mg into the muscle every 14 (fourteen) days.     No current facility-administered medications for this visit.   Facility-Administered Medications Ordered in Other Visits  Medication Dose Route Frequency Provider Last Rate Last Admin   heparin lock flush 100 unit/mL  500 Units Intracatheter Once Si Gaul, MD       sodium chloride flush (NS) 0.9 % injection 10 mL  10 mL Intracatheter Once Si Gaul, MD        SURGICAL HISTORY:  Past Surgical History:  Procedure Laterality Date   CHEST TUBE INSERTION Right 01/20/2022   Procedure: CHEST TUBE INSERTION;  Surgeon: Lorin Glass, MD;  Location: [facility] ENDOSCOPY;  Service: Pulmonary;  Laterality: Right;  w/ Talc Pleurodesis, planned admit for Obs afterwards   CHEST  TUBE INSERTION Right 04/28/2022   Procedure: INSERTION PLEURAL DRAINAGE CATHETER -  Pigtail tail, drainage;  Surgeon: Lorin Glass, MD;  Location: [facility] ENDOSCOPY;  Service: Pulmonary;  Laterality: Right;  talc pleurodesis   CHEST TUBE INSERTION Right 09/01/2022   Procedure: INSERTION PLEURAL DRAINAGE CATHETER;  Surgeon: Lupita Leash, MD;  Location: [facility] ENDOSCOPY;  Service: Cardiopulmonary;  Laterality: Right;  pigtail catheter placement with talc pleurodesis   COLONOSCOPY WITH PROPOFOL N/A 12/17/2018   Procedure: COLONOSCOPY WITH PROPOFOL;  Surgeon: Pasty Spillers, MD;  Location: [facility] ENDOSCOPY;  Service: Gastroenterology;  Laterality: N/A;   IR IMAGING GUIDED PORT INSERTION  04/17/2020   IR THORACENTESIS ASP PLEURAL SPACE W/IMG GUIDE  11/29/2021   IR THORACENTESIS ASP PLEURAL SPACE W/IMG GUIDE  01/03/2022   JOINT REPLACEMENT Bilateral    REPLACEMENT TOTAL KNEE BILATERAL  2015   TALC PLEURODESIS  09/01/2022   Procedure: Lurlean Nanny;  Surgeon: Lupita Leash, MD;  Location: [facility] ENDOSCOPY;  Service: Cardiopulmonary;;   TONSILLECTOMY  1960   TRANSTHORACIC ECHOCARDIOGRAM  05/2020   a) 05/2020: EF 55 to 60%.  No R WMA.  Mild LVH.  Indeterminate LVEDP.  Unable to assess RVP.  Normal aortic and mitral valves.  Mildly elevated RAP.; b) 09/2021: EF 50-55%. No RWMA. Mild LVH. ~ LVEDP. Mild LA Dilation. NORMAL RV/RAP.  Normal MV/AoV.    REVIEW OF SYSTEMS:   Constitutional: Positive for stable/improved fatigue and decreased appetite secondary to taste alterations. Positive for weight gain. negative for chills and fever.  HENT: Positive for taste alterations. Negative for mouth sores, sore throat and trouble swallowing.   Eyes: Negative for eye problems and icterus.  Respiratory: Positive for stable/improved shortness of breath. Negative for hemoptysis and wheezing.   Cardiovascular: Negative for chest pain and leg swelling.  Gastrointestinal: Negative for abdominal pain, constipation,  diarrhea, nausea and vomiting.  Genitourinary: Negative for bladder incontinence, difficulty urinating, dysuria, frequency and hematuria.   Musculoskeletal: Improved back pain. Negative for gait problem, neck pain and neck stiffness.  Skin: Negative for itching and rash.  Neurological: Negative for dizziness, extremity weakness, gait problem, headaches, light-headedness and seizures.  Hematological: Negative for adenopathy. Does not bruise/bleed easily.  Psychiatric/Behavioral: Negative for confusion, depression and sleep disturbance. The patient is not nervous/anxious.     PHYSICAL EXAMINATION:  There were no vitals taken for this visit.  ECOG PERFORMANCE STATUS: 1  Physical Exam  Constitutional: Oriented to person, place, and time and well-developed, well-nourished, and in no distress.  HENT:  Head: Normocephalic and atraumatic.  Mouth/Throat: Oropharynx is clear and moist. No oropharyngeal exudate.  Eyes: Conjunctivae are normal. Right eye exhibits no discharge. Left eye exhibits no discharge. No scleral icterus.  Neck: Normal range of motion. Neck supple.  Cardiovascular: Normal rate, regular rhythm, normal heart sounds and intact distal pulses.   Pulmonary/Chest: Effort normal. Quiet breath sounds in the right lung. No respiratory distress. No wheezes. No rales.  Abdominal: Soft. Bowel sounds are normal. Exhibits no distension and no mass. There is no tenderness.  Musculoskeletal: Normal range of motion. Exhibits no edema.  Lymphadenopathy:    No cervical adenopathy.  Neurological: Alert and oriented to person, place, and time. Exhibits normal muscle tone. Examined in the wheelchair.  Skin: Skin is warm and dry. No rash noted. Not diaphoretic. No erythema. No pallor.  Psychiatric: Mood, memory and judgment normal.  Vitals reviewed.    LABORATORY DATA: Lab Results  Component Value Date   WBC 1.9 (L) 02/22/2024   HGB 8.8 (L) 02/22/2024   HCT 28.4 (L) 02/22/2024   MCV  94.4  02/22/2024   PLT 46 (L) 02/22/2024      Chemistry      Component Value Date/Time   NA 141 02/22/2024 1213   K 5.2 (H) 02/22/2024 1213   CL 104 02/22/2024 1213   CO2 31 02/22/2024 1213   BUN 12 02/22/2024 1213   CREATININE 1.09 02/22/2024 1213   CREATININE 1.45 (H) 10/18/2021 1533      Component Value Date/Time   CALCIUM 8.5 (L) 02/22/2024 1213   ALKPHOS 78 02/22/2024 1213   AST 16 02/22/2024 1213   ALT 12 02/22/2024 1213   BILITOT 0.4 02/22/2024 1213       RADIOGRAPHIC STUDIES:  No results found.   ASSESSMENT/PLAN:  This is a very pleasant 73 year old Caucasian male diagnosed with extensive stage (T4, N3, M1C) small cell lung cancer presented with large right upper lobe lung mass in addition to mediastinal and right supraclavicular lymphadenopathy and multiple metastatic liver lesions diagnosed in April 2021.    The patient initially underwent systemic chemotherapy with carboplatin for an AUC of 5 on day 1, etoposide 100 mg per metered squared on days 1, 2, and 3 in addition to [facility] for myeloprotection.  He also received immunotherapy with Imfinzi on day 1 of every chemotherapy cycle.  He is status post 8 cycles.  Starting from cycle #5 he was on single agent immunotherapy with Imfinzi IV every 4 weeks   The patient had evidence of disease progression on his scan from December 2021.   He was then started on systemic chemotherapy with carboplatin for an AUC of 5 on day 1, etoposide 100 mg per metered squared on days 1, 2, and 3 in addition to Tecentriq 1200 mg on day 1, the patient is status post 8 cycles.  Starting from cycle #5, the patient started maintenance immunotherapy with Tecentriq.  This was discontinued due to evidence of disease progression.   The patient then underwent Zepzelca IV every 3 weeks.  He is status post his 12 cycles and he tolerated it well except for fatigue a few days after treatment.  This was discontinued in February 2023 due to evidence of disease  progression.    The patient then underwent treatment with irinotecan 65 mg per metered squared on days 1 and 8 IV every 3 weeks.  The patient is status post 3 cycles and tolerated it fairly well without any concerning adverse side effects.  This was discontinued secondary to disease progression.   Dr. Arbutus Ped saw the patient after this to discuss the options including palliative care and hospice versus single agent Gemzar, or Taxol.  He also discussed repeating his initial treatment with carboplatin and etoposide since he had a good response to treatment in the past.   He was referred to Dr. Janann August at [facility]  for consideration of enrollment in the clinical trial with tarlatamab.    He also saw Dr. Loney Hering at [facility] for consideration of enrollment in the [place] [id number] trial.  The patient was not interested due to the travel burden.   The patient was interested in restarting systemic chemotherapy with carboplatin for an AUC of 5 on day 1 and etoposide 100 mg per metered squared on days 1, 2, and 3 with Cosela. He is status post 14 cycles and tolerated it well. His dose of carboplatin was reduced to an AUC of 4 and etoposide 80 mg per metered squared.     He underwent SBRT to this which was completed on  01/27/23.    The patient was seen by Dr. Janann August at [facility] and treated with tarlatamab which is a by specific T-cell engager (BiTE) for the last 8 months.  Unfortunately the repeat CT scan of the chest, abdomen and pelvis in December 2024 showed interval mild to moderate increase in the size of the segment 7 liver lesion in addition to interval mild increase in the size of portacaval and common hepatic artery lymph nodes with stable size retroperitoneal lymph nodes and stable size right adrenal nodule.  There was no evidence for disease progression in the chest.  There was focal enhancement in the right iliac bone concerning for metastatic disease.    He recently completed palliative radiation  to the right iliac bone lesion in February 2025.    Dr. Arbutus Ped recommended Topotecan regimen: 5 days in a row every 3 weeks.  He underwent his two cycles of treatment on 01/11/2024.  He tolerated it fairly well except he did have some cytopenias.    I let the patient know moving forward that we will monitor his labs closely on a weekly basis. Should his platelet count be less than 50,000, we would recommend holding his blood thinner until his platelet count is greater than 50.  We would also consider G-CSF injections if his ANC were less than 0.6.  I have added standing orders for sample to blood bank.    Reviewed his labs today with Dr. Arbutus Ped. The patient was also seen with Dr. Arbutus Ped today.  His labs are within parameters to proceed with treatment today as scheduled.  Labs were reviewed. Recommend that he proceed with day 1 cycle #3 today as scheduled.   I will arrange for a restaging CT scan of the CAP prior to his next cycle of treatment.   Sent him a prescription for nystatin mouth rinse to help with his taste alterations secondary to thrush.   He will continue taking Remeron to take 15 mg p.o. at night to help with his decreased appetite.  Would also recommend salt water rinses and Biotene.   He is scheduled to see palliative care today.  Patient's pain has improved at this time.  We will assess his back and hip pain on his upcoming CT scan of the chest, abdomen, and pelvis.   We will see him back for follow-up visit in 3 weeks for evaluation repeat blood work before undergoing cycle #3    Per Dr. Asa Lente previous notes, he may consider switching him to carboplatin and paclitaxel if topotecan is ineffective.  Calcium is a little bit low.  He was instructed to increase his calcium and dairy intake.  The patient was advised to call immediately if he has any concerning symptoms in the interval. The patient voices understanding of current disease status and treatment options and is in  agreement with the current care plan. All questions were answered. The patient knows to call the clinic with any problems, questions or concerns. We can certainly see the patient much sooner if necessary    No orders of the defined types were placed in this encounter.     Eldridge Marcott L Maliq Pilley, PA-C 02/23/24  ADDENDUM: Hematology/Oncology Attending: I had a face-to-face encounter with the patient today.  I reviewed his record, lab and recommended his care plan.  This is a very pleasant 73 years old white male with relapse and metastatic small cell lung cancer that was initially diagnosed as extensive stage in April 2021.  He is  status post several treatment in the past including systemic chemotherapy with carboplatin, etoposide and Imfinzi followed by maintenance Imfinzi then on relapse of his disease he was treated again with carboplatin, etoposide and atezolizumab then on disease progression he was treated with zepzelca and then on Tarlatamab at [facility].  He had progression recently and the patient is currently undergoing treatment with single agent reduced dose topotecan status post 2 cycles.  He has been tolerating his treatment well except for the fatigue and pancytopenia.  His blood count is much better today and we will proceed with cycle #3 today as planned. I will see him back for follow-up visit in 3 weeks for reevaluation with repeat CT scan of the chest, abdomen and pelvis for restaging of his disease. The patient was advised to call immediately if he has any other concerning symptoms in the interval. he total time spent in the appointment was 30 minutes. Disclaimer: This note was dictated with voice recognition software. Similar sounding words can inadvertently be transcribed and may be missed upon review. Lajuana Matte, MD

## 2024-02-24 ENCOUNTER — Ambulatory Visit: Payer: Medicare Other

## 2024-02-25 ENCOUNTER — Ambulatory Visit: Payer: Medicare Other

## 2024-02-25 ENCOUNTER — Inpatient Hospital Stay

## 2024-02-25 ENCOUNTER — Other Ambulatory Visit: Payer: PRIVATE HEALTH INSURANCE

## 2024-02-25 DIAGNOSIS — Z515 Encounter for palliative care: Secondary | ICD-10-CM | POA: Diagnosis not present

## 2024-02-25 DIAGNOSIS — C3411 Malignant neoplasm of upper lobe, right bronchus or lung: Secondary | ICD-10-CM | POA: Diagnosis not present

## 2024-02-25 DIAGNOSIS — Z5111 Encounter for antineoplastic chemotherapy: Secondary | ICD-10-CM | POA: Diagnosis not present

## 2024-02-25 DIAGNOSIS — E119 Type 2 diabetes mellitus without complications: Secondary | ICD-10-CM | POA: Diagnosis not present

## 2024-02-25 DIAGNOSIS — C787 Secondary malignant neoplasm of liver and intrahepatic bile duct: Secondary | ICD-10-CM | POA: Diagnosis not present

## 2024-02-25 DIAGNOSIS — M545 Low back pain, unspecified: Secondary | ICD-10-CM | POA: Diagnosis not present

## 2024-02-25 LAB — SAMPLE TO BLOOD BANK

## 2024-02-25 LAB — CMP (CANCER CENTER ONLY)
ALT: 11 U/L (ref 0–44)
AST: 17 U/L (ref 15–41)
Albumin: 3.5 g/dL (ref 3.5–5.0)
Alkaline Phosphatase: 74 U/L (ref 38–126)
Anion gap: 6 (ref 5–15)
BUN: 13 mg/dL (ref 8–23)
CO2: 29 mmol/L (ref 22–32)
Calcium: 8 mg/dL — ABNORMAL LOW (ref 8.9–10.3)
Chloride: 106 mmol/L (ref 98–111)
Creatinine: 1.08 mg/dL (ref 0.61–1.24)
GFR, Estimated: >60 mL/min (ref >60)
Glucose, Bld: 167 mg/dL — ABNORMAL HIGH (ref 70–99)
Potassium: 4.1 mmol/L (ref 3.5–5.1)
Sodium: 141 mmol/L (ref 135–145)
Total Bilirubin: 0.4 mg/dL (ref 0.0–1.2)
Total Protein: 6 g/dL — ABNORMAL LOW (ref 6.5–8.1)

## 2024-02-25 LAB — CBC WITH DIFFERENTIAL (CANCER CENTER ONLY)
Abs Immature Granulocytes: 0.01 10*3/uL (ref 0.00–0.07)
Basophils Absolute: 0 10*3/uL (ref 0.0–0.1)
Basophils Relative: 1 %
Eosinophils Absolute: 0 10*3/uL (ref 0.0–0.5)
Eosinophils Relative: 1 %
HCT: 28.5 % — ABNORMAL LOW (ref 39.0–52.0)
Hemoglobin: 8.9 g/dL — ABNORMAL LOW (ref 13.0–17.0)
Immature Granulocytes: 0 %
Lymphocytes Relative: 11 %
Lymphs Abs: 0.3 10*3/uL — ABNORMAL LOW (ref 0.7–4.0)
MCH: 29.5 pg (ref 26.0–34.0)
MCHC: 31.2 g/dL (ref 30.0–36.0)
MCV: 94.4 fL (ref 80.0–100.0)
Monocytes Absolute: 0.4 10*3/uL (ref 0.1–1.0)
Monocytes Relative: 18 %
Neutro Abs: 1.7 10*3/uL (ref 1.7–7.7)
Neutrophils Relative %: 69 %
Platelet Count: 101 10*3/uL — ABNORMAL LOW (ref 150–400)
RBC: 3.02 MIL/uL — ABNORMAL LOW (ref 4.22–5.81)
RDW: 19.3 % — ABNORMAL HIGH (ref 11.5–15.5)
WBC Count: 2.5 10*3/uL — ABNORMAL LOW (ref 4.0–10.5)
nRBC: 0.8 % — ABNORMAL HIGH (ref 0.0–0.2)

## 2024-02-25 MED ORDER — SODIUM CHLORIDE 0.9% FLUSH
10.0000 mL | Freq: Once | INTRAVENOUS | Status: DC
Start: 2024-02-25 — End: 2024-02-25

## 2024-02-25 MED ORDER — HEPARIN SOD (PORK) LOCK FLUSH 100 UNIT/ML IV SOLN: 500.0000 [IU] | Freq: Once | INTRAVENOUS | Status: DC

## 2024-02-26 ENCOUNTER — Ambulatory Visit: Payer: Medicare Other

## 2024-02-26 NOTE — Progress Notes (Signed)
 Palliative Medicine [facility]  [phone] [phone]   Name: Adam Wise Date: 02/26/2024 MRN: [id number]  DOB: 27-Dec-1950  Patient Care Team: Heilingoetter, Dossie Arbour as PCP - General (Physician Assistant) Marykay Lex, MD as PCP - Cardiology (Cardiology) Syliva Overman, RN as Oncology Nurse Navigator Pickenpack-Cousar, Arty Baumgartner, NP as Nurse Practitioner [facility] and Palliative Medicine)    INTERVAL HISTORY: Adam Wise is a 73 y.o. 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 Palliative ask to see for symptom management and goals of care.   SOCIAL HISTORY:     reports that he has quit smoking. He has never used smokeless tobacco. He reports that he does not currently use alcohol. He reports that he does not currently use drugs.  ADVANCE DIRECTIVES:  Advanced directives on file naming Adam Wise, Adam Wise, Adam Wise, and Adam Wise as decision makers for this pt should [name] become unable to speak for himself.   CODE STATUS: Full code  PAST MEDICAL HISTORY: Past Medical History:  Diagnosis Date   Acute on chronic respiratory failure with hypoxia (HCC) 10/12/2021   Chickenpox    Chronic knee pain    Chronic low back pain    COPD exacerbation (HCC) 10/11/2021   COPD with exacerbation (HCC) 10/12/2021   Coronary artery calcification seen on CAT scan 11/2021   Coronary CTA 11/27/2021: Moderate to large right pleural effusion and compressive atelectasis right lung base. => Coronary Calcium Score 657.  Diffuse RCA calcification.  Minimal mild disease in the LAD and diagonal branches. == Overall limited study.  Notable artifact.   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 He has disease progression after the first line of chemotherapy in December 2021.   Testosterone deficiency    Type 2 diabetes mellitus (HCC)     ALLERGIES:  is allergic to bupropion and hydrochlorothiazide.  MEDICATIONS:  Current Outpatient Medications  Medication Sig Dispense Refill   albuterol (VENTOLIN HFA) 108 (90 Base) MCG/ACT inhaler INHALE 2 PUFFS INTO THE LUNGS EVERY 4 (FOUR) HOURS AS NEEDED FOR SHORTNESS OF BREATH 18 each 0   amLODipine (NORVASC) 10 MG tablet Take 0.5 tablets (5 mg total) by mouth daily. TAKE 1 TABLET BY MOUTH EVERY DAY for blood pressure.     B-D 3CC LUER-LOK SYR 22GX1" 22G X 1" 3 ML MISC USE AS INSTRUCTED FOR TESTOSTERONE INJECTION EVERY 2 WEEKS 10 each 2   Black Pepper-Turmeric (TURMERIC COMPLEX/BLACK PEPPER PO) Take 1 tablet by mouth in the morning and at bedtime.     Coenzyme Q10 (COQ10) 200 MG CAPS Take 200 mg by mouth at bedtime.     cyclobenzaprine (FLEXERIL) 5 MG tablet TAKE 1 TABLET BY MOUTH AT BEDTIME AS NEEDED FOR MUSCLE SPASMS. 90 tablet 0   docusate sodium (COLACE) 100 MG capsule Take 1 capsule (100 mg total) by mouth every 12 (twelve) hours. (Patient not taking: Reported on 01/28/2024) 60 capsule 0   ELIQUIS 5 MG TABS tablet TAKE 1 TABLET BY MOUTH TWICE  A DAY 180 tablet 2   esomeprazole (NEXIUM) 20 MG capsule Take 20 mg by mouth in the morning.     gabapentin (NEURONTIN) 300 MG capsule TAKE 1 CAPSULE (300 MG TOTAL) BY MOUTH 2 (TWO) TIMES DAILY. FOR BACK PAIN. 180 capsule 1   glucose blood (ONETOUCH ULTRA) test strip USE UP TO 4 TIMES DAILY AS DIRECTED 400 strip 5   Insulin Pen Needle (BD PEN NEEDLE NANO U/F) 32G X 4 MM MISC Use with insulin as prescribed Dx Code: E11.9 100 each 3    ipratropium-albuterol (DUONEB) 0.5-2.5 (3) MG/3ML SOLN Take 3 mLs by nebulization every 4 (four) hours while awake for 3 days, THEN 3 mLs every 4 (four) hours as needed (shortness of breath or wheezing). 360 mL 0   KRILL OIL PO Take 1 capsule by mouth daily.     Lancets MISC USE UP TO 3 TIMES DAILY AS DIRECTED 100 each 2   lidocaine (LIDODERM) 5 % Place 1 patch onto the skin daily. Remove & Discard patch within 12 hours or as directed by MD 30 patch 0   losartan (COZAAR) 50 MG tablet TAKE 1 TABLET (50 MG TOTAL) BY MOUTH DAILY. FOR BLOOD PRESSURE. 90 tablet 3   Melatonin 10 MG CAPS Take 10 mg by mouth at bedtime as needed (for sleep). (Patient not taking: Reported on 01/28/2024)     metFORMIN (GLUCOPHAGE) 1000 MG tablet TAKE 1 TABLET (1,000 MG TOTAL) BY MOUTH 2 (TWO) TIMES DAILY WITH A MEAL. FOR DIABETES 180 tablet 1   mirtazapine (REMERON) 15 MG tablet Take 1 tablet (15 mg total) by mouth at bedtime. 30 tablet 2   morphine (MS CONTIN) 30 MG 12 hr tablet Take 1 tablet (30 mg total) by mouth 2 (two) times daily. 60 tablet 0   Multiple Vitamin (MULTI-VITAMINS) TABS Take 1 tablet by mouth daily with breakfast.     Naloxone HCl 3 MG/0.1ML LIQD Place 1 spray into both nostrils once. For known/suspected opiod overdose. Every 2-3 minutes in alternating nostril till EMS arrives.     ondansetron (ZOFRAN) 4 MG tablet Take 1 tablet (4 mg total) by mouth every 8 (eight) hours as needed for nausea or vomiting. 30 tablet 0   oxyCODONE-acetaminophen (PERCOCET) 10-325 MG tablet Take 1 tablet by mouth every 4 (four) hours as needed.     pioglitazone (ACTOS) 45 MG tablet TAKE 1 TABLET (45 MG TOTAL) BY MOUTH DAILY. FOR DIABETES. 90 tablet 1   pravastatin (PRAVACHOL) 40 MG tablet TAKE 1 TABLET BY MOUTH EVERY DAY IN THE EVENING FOR CHOLESTEROL 90 tablet 3   PRESCRIPTION MEDICATION CPAP- At bedtime     testosterone cypionate (DEPOTESTOSTERONE CYPIONATE) 200 MG/ML injection Inject 200 mg into the muscle every 14 (fourteen)  days.     No current facility-administered medications for this visit.   Facility-Administered Medications Ordered in Other Visits  Medication Dose Route Frequency Provider Last Rate Last Admin   heparin lock flush 100 unit/mL  500 Units Intracatheter Once Si Gaul, MD       sodium chloride flush (NS) 0.9 % injection 10 mL  10 mL Intracatheter Once Si Gaul, MD        VITAL SIGNS: There were no vitals taken for this visit. There were no vitals filed for this visit.  Estimated body mass index is 34.85 kg/m as calculated from the following:   Height as of 02/08/24: 5\' 9"  (1.753 m).   Weight as of 02/11/24: 236 lb (107 kg).   PERFORMANCE  STATUS (ECOG) : 1 - Symptomatic but completely ambulatory   Physical Exam General: NAD Cardiovascular: regular rate and rhythm Pulmonary: normal breathing pattern Extremities: no edema, no joint deformities Skin: no rashes Neurological: AAO x3  Discussed the use of AI scribe software for clinical note transcription with the patient, who gave verbal consent to proceed.  IMPRESSION:  Adam Wise "[name]" is a 73 year old male who presents for follow-up for symptom management. His wife is present. States he has been feeling well overall. Enjoyed this past weekend as his daughters who surprised him. They were able to go to one of his favorite seafood restaurants. Denies concerns with nausea, vomiting, or diarrhea. Some challenges with constipation however this is relieved with use of Miralax. Encouraged regular use.  He describes his energy levels as 'up and down' but notes improvement, mentioning that he has been active in the garage. He attributes some of this improvement to weight loss. He is looking forward to being able to mow the grass this summer, indicating a desire to maintain activity levels. He has been experiencing a sore throat upon waking up for a couple of days, which he attributes to his CPAP use. The symptoms are not  severe. His sleep is satisfactory.  We discussed his pain at length. Feels his pain is well controlled. Much better than weeks prior. He mentions ongoing back pain, though it is not as severe or constant as it was previously. He continues to take MS Contin every 12 hours without difficulty. Tylenol as needed for mild aches and discomfort, but has reduced his use of breakthrough Percocet, which he only takes occasionally.   All questions answered and support provided.   I discussed the importance of continued conversation with family and their medical providers regarding overall plan of care and treatment options, ensuring decisions are within the context of the patients values and GOCs.  Assessment and Plan  Pain Lower back pain with improvement. Pain is intermittent and less severe. Occasional use of Percocet, but not consistently required. - Continue current pain management regimen with Tylenol as needed. - Use Percocet sparingly for breakthrough pain. -MS Contin 30 mg every 12 hours -Miralax daily for bowel regimen   Fatigue Fatigue much improved. Patient able to perform some activity around the home including garage work. -Continue to slowly increase activity level while listening to body and allowing for breaks as needed.   I will plan to follow-up with patient in 4-6 weeks. Sooner if needed.  Patient expressed understanding and was in agreement with this plan. He also understands that He can call the clinic at any time with any questions, concerns, or complaints.   Any controlled substances utilized were prescribed in the context of palliative care. PDMP has been reviewed.   Visit consisted of counseling and education dealing with the complex and emotionally intense issues of symptom management and palliative care in the setting of serious and potentially life-threatening illness.  Willette Alma, AGPCNP-BC  Palliative Medicine Team/[facility]

## 2024-02-27 ENCOUNTER — Other Ambulatory Visit: Payer: Self-pay | Admitting: Physician Assistant

## 2024-02-27 DIAGNOSIS — R630 Anorexia: Secondary | ICD-10-CM

## 2024-02-29 ENCOUNTER — Other Ambulatory Visit

## 2024-02-29 ENCOUNTER — Inpatient Hospital Stay: Payer: Medicare Other

## 2024-02-29 ENCOUNTER — Inpatient Hospital Stay

## 2024-02-29 ENCOUNTER — Encounter: Payer: Self-pay | Admitting: Nurse Practitioner

## 2024-02-29 ENCOUNTER — Other Ambulatory Visit: Payer: Self-pay

## 2024-02-29 ENCOUNTER — Inpatient Hospital Stay (HOSPITAL_BASED_OUTPATIENT_CLINIC_OR_DEPARTMENT_OTHER): Payer: Medicare Other | Admitting: Physician Assistant

## 2024-02-29 ENCOUNTER — Inpatient Hospital Stay (HOSPITAL_BASED_OUTPATIENT_CLINIC_OR_DEPARTMENT_OTHER): Admitting: Nurse Practitioner

## 2024-02-29 ENCOUNTER — Other Ambulatory Visit: Payer: Medicare Other

## 2024-02-29 VITALS — HR 97

## 2024-02-29 VITALS — BP 109/74 | HR 104 | Temp 97.6°F | Resp 18 | Ht 69.0 in | Wt 251.2 lb

## 2024-02-29 DIAGNOSIS — Z515 Encounter for palliative care: Secondary | ICD-10-CM | POA: Diagnosis not present

## 2024-02-29 DIAGNOSIS — R530 Neoplastic (malignant) related fatigue: Secondary | ICD-10-CM

## 2024-02-29 DIAGNOSIS — C787 Secondary malignant neoplasm of liver and intrahepatic bile duct: Secondary | ICD-10-CM | POA: Diagnosis not present

## 2024-02-29 DIAGNOSIS — C3411 Malignant neoplasm of upper lobe, right bronchus or lung: Secondary | ICD-10-CM

## 2024-02-29 DIAGNOSIS — Z5111 Encounter for antineoplastic chemotherapy: Secondary | ICD-10-CM | POA: Diagnosis not present

## 2024-02-29 DIAGNOSIS — M545 Low back pain, unspecified: Secondary | ICD-10-CM | POA: Diagnosis not present

## 2024-02-29 DIAGNOSIS — Z95828 Presence of other vascular implants and grafts: Secondary | ICD-10-CM

## 2024-02-29 DIAGNOSIS — G893 Neoplasm related pain (acute) (chronic): Secondary | ICD-10-CM

## 2024-02-29 DIAGNOSIS — E119 Type 2 diabetes mellitus without complications: Secondary | ICD-10-CM | POA: Diagnosis not present

## 2024-02-29 LAB — CBC WITH DIFFERENTIAL (CANCER CENTER ONLY)
Abs Immature Granulocytes: 0.01 10*3/uL (ref 0.00–0.07)
Basophils Absolute: 0 10*3/uL (ref 0.0–0.1)
Basophils Relative: 0 %
Eosinophils Absolute: 0 10*3/uL (ref 0.0–0.5)
Eosinophils Relative: 1 %
HCT: 28.7 % — ABNORMAL LOW (ref 39.0–52.0)
Hemoglobin: 8.9 g/dL — ABNORMAL LOW (ref 13.0–17.0)
Immature Granulocytes: 0 %
Lymphocytes Relative: 11 %
Lymphs Abs: 0.4 10*3/uL — ABNORMAL LOW (ref 0.7–4.0)
MCH: 29.9 pg (ref 26.0–34.0)
MCHC: 31 g/dL (ref 30.0–36.0)
MCV: 96.3 fL (ref 80.0–100.0)
Monocytes Absolute: 0.4 10*3/uL (ref 0.1–1.0)
Monocytes Relative: 11 %
Neutro Abs: 2.6 10*3/uL (ref 1.7–7.7)
Neutrophils Relative %: 77 %
Platelet Count: 238 10*3/uL (ref 150–400)
RBC: 2.98 MIL/uL — ABNORMAL LOW (ref 4.22–5.81)
RDW: 20.3 % — ABNORMAL HIGH (ref 11.5–15.5)
WBC Count: 3.4 10*3/uL — ABNORMAL LOW (ref 4.0–10.5)
nRBC: 0 % (ref 0.0–0.2)

## 2024-02-29 LAB — CMP (CANCER CENTER ONLY)
ALT: 10 U/L (ref 0–44)
AST: 18 U/L (ref 15–41)
Albumin: 3.5 g/dL (ref 3.5–5.0)
Alkaline Phosphatase: 77 U/L (ref 38–126)
Anion gap: 7 (ref 5–15)
BUN: 13 mg/dL (ref 8–23)
CO2: 28 mmol/L (ref 22–32)
Calcium: 7.6 mg/dL — ABNORMAL LOW (ref 8.9–10.3)
Chloride: 104 mmol/L (ref 98–111)
Creatinine: 1.1 mg/dL (ref 0.61–1.24)
GFR, Estimated: >60 mL/min (ref >60)
Glucose, Bld: 190 mg/dL — ABNORMAL HIGH (ref 70–99)
Potassium: 4.1 mmol/L (ref 3.5–5.1)
Sodium: 139 mmol/L (ref 135–145)
Total Bilirubin: 0.4 mg/dL (ref 0.0–1.2)
Total Protein: 5.7 g/dL — ABNORMAL LOW (ref 6.5–8.1)

## 2024-02-29 LAB — SAMPLE TO BLOOD BANK

## 2024-02-29 IMAGING — CT CT CHEST W/ CM
2 of 5 series · 12 of 36 positions shown, 15 images · IV contrast (APPLIED)
Comparison: Multiple priors, most recently CT the chest, abdomen
and pelvis 02/06/2022.

CLINICAL DATA: 70-year-old male with history of right-sided small
cell lung cancer originally diagnosed 2 years ago. Radiation therapy
complete. Chemotherapy ongoing. Follow-up study.

* Tracking Code: BO *
EXAM:
CT CHEST, ABDOMEN, AND PELVIS WITH CONTRAST
TECHNIQUE: Multidetector CT imaging of the chest, abdomen and pelvis was
performed following the standard protocol during bolus
administration of intravenous contrast.

[Series 2: cap with · axial · 0.98mm/px · z∈[+1077,+1612]mm · 9 of 135 slices shown, 12 images]
[im 14/135  mediastinal]
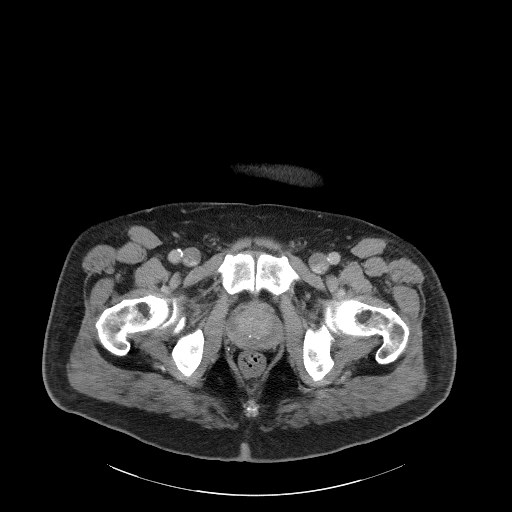
[im 14/135  lung]
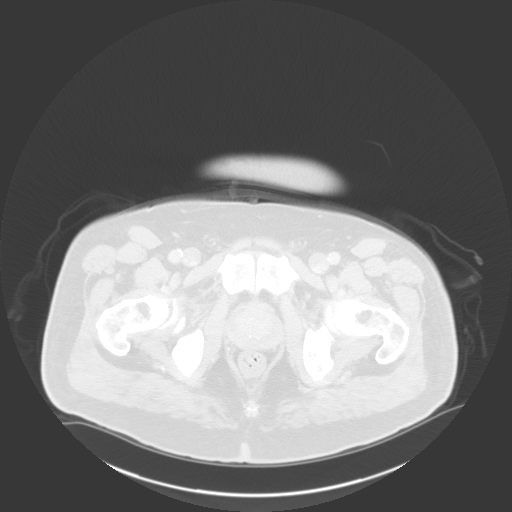
[im 27/135  lung]
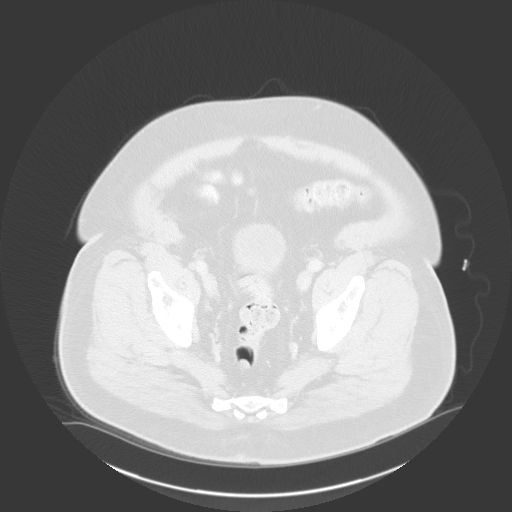
[im 41/135  lung]
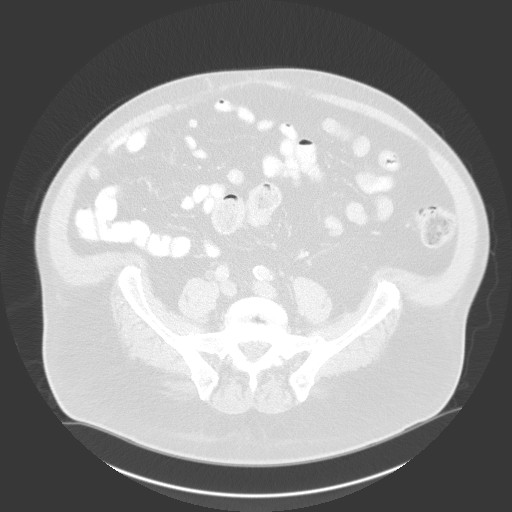
[im 54/135  lung]
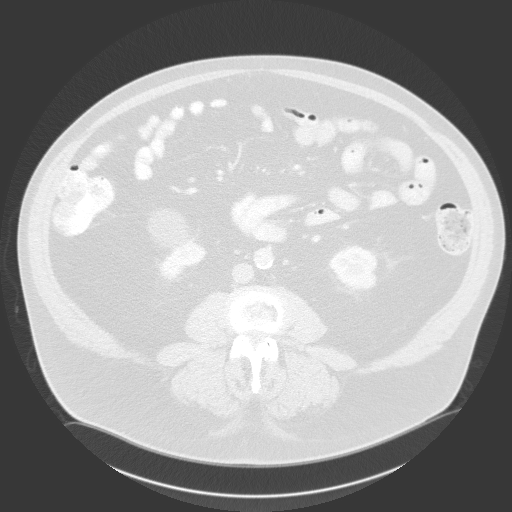
[im 68/135  mediastinal]
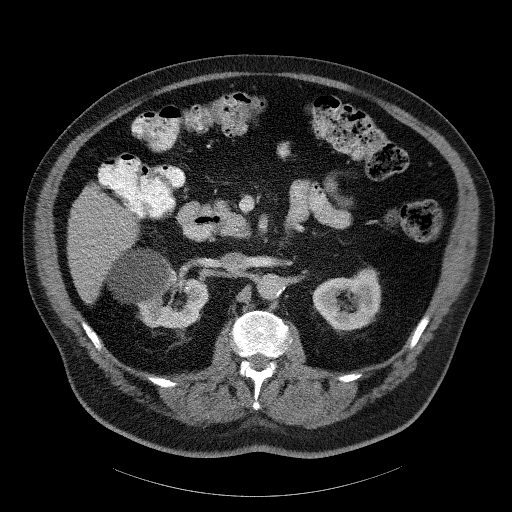
[im 68/135  lung]
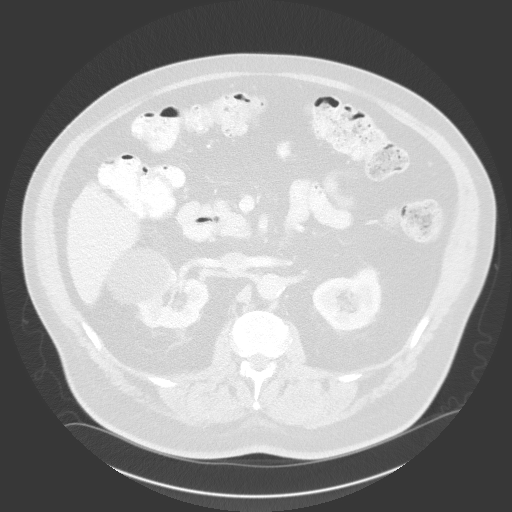
[im 81/135  lung]
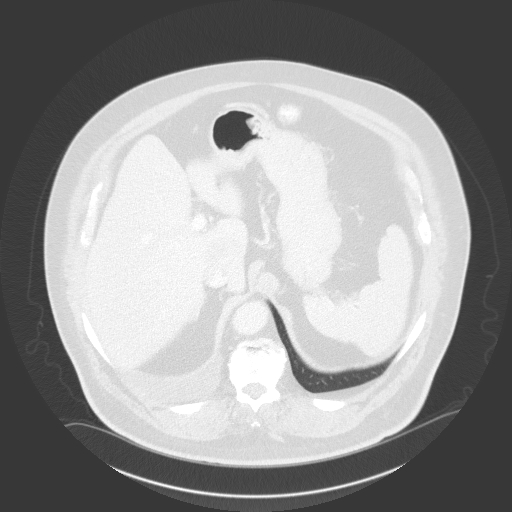
[im 94/135  lung]
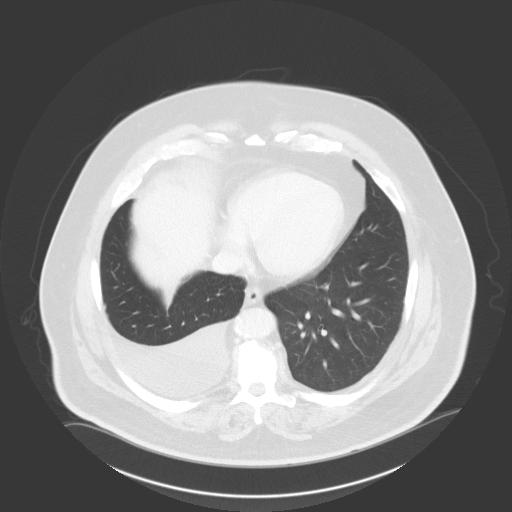
[im 108/135  lung]
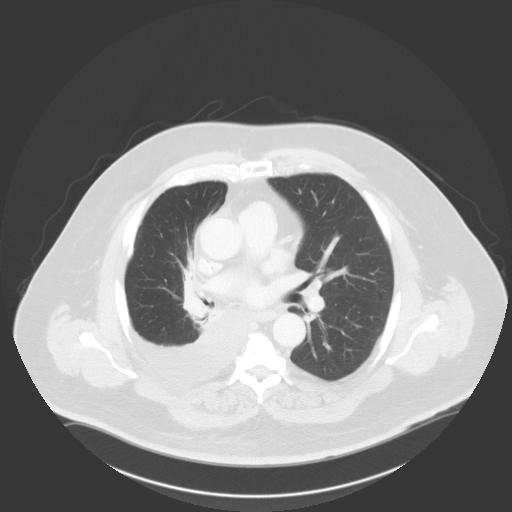
[im 121/135  mediastinal]
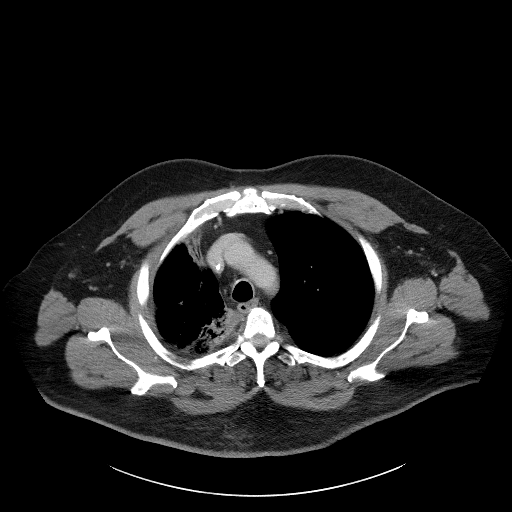
[im 121/135  lung]
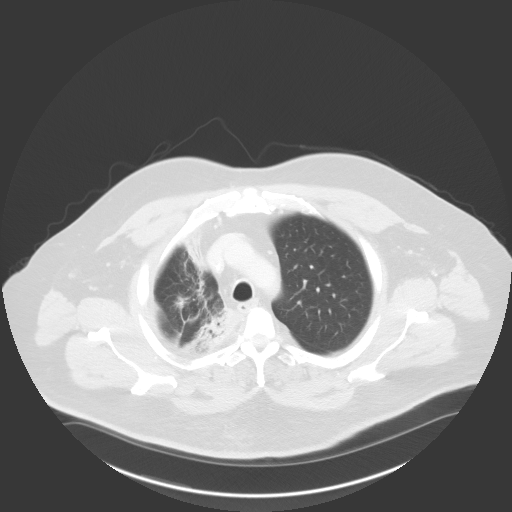

[Series 5: coronals · coronal · 0.96mm/px · 3 of 206 slices shown]
[im 42/206  lung]
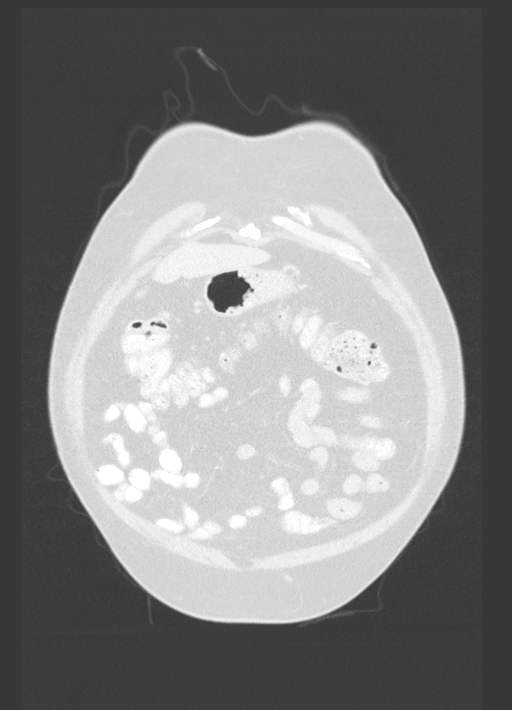
[im 83/206  lung]
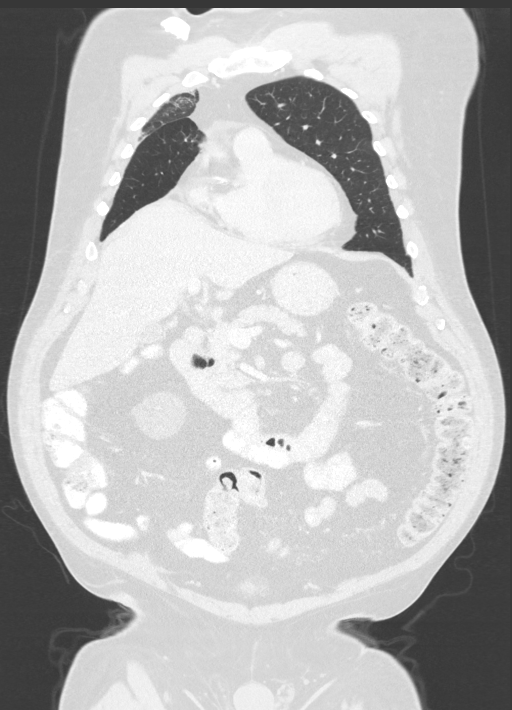
[im 124/206  lung]
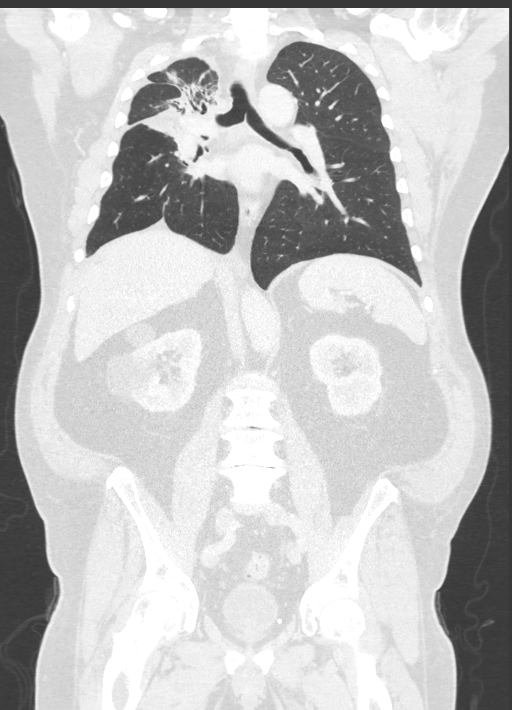

[12 of 36 positions shown; findings below may reference images not displayed]

RADIATION DOSE REDUCTION: This exam was performed according to the
departmental dose-optimization program which includes automated
exposure control, adjustment of the mA and/or kV according to
patient size and/or use of iterative reconstruction technique.

CONTRAST:  100mL OMNIPAQUE IOHEXOL 300 MG/ML  SOLN
FINDINGS: CT CHEST FINDINGS

Cardiovascular: Heart size is normal. There is no significant
pericardial fluid, thickening or pericardial calcification. There is
aortic atherosclerosis, as well as atherosclerosis of the great
vessels of the mediastinum and the coronary arteries, including
calcified atherosclerotic plaque in the left main, left anterior
descending, left circumflex and right coronary arteries. Right
internal jugular single-lumen porta cath with tip terminating in the
right atrium.

Mediastinum/Nodes: No pathologically enlarged mediastinal or hilar
lymph nodes. Esophagus is unremarkable in appearance. No axillary
lymphadenopathy.

Lungs/Pleura: Extensive architectural distortion and chronic volume
loss is noted in the perihilar aspect of the right mid to upper
lung, similar to the prior examination, most compatible with areas
of chronic postradiation fibrosis. Associated with this is a
moderate to large chronic right pleural effusion lying dependently
which is similar to the prior study. 1.2 cm nodule in the right
upper lobe near the apex (axial image 34 of series 4), stable. No
definite new suspicious appearing pulmonary nodules or masses are
noted. No left pleural effusion.

Musculoskeletal: There are no aggressive appearing lytic or blastic
lesions noted in the visualized portions of the skeleton.

CT ABDOMEN PELVIS FINDINGS

Hepatobiliary: Several hypovascular hepatic lesions are increased in
size compared to the prior examination, concerning for progressive
metastatic disease to the liver. These include a 3.5 x 2.7 cm lesion
in the central aspect of segment [DATE] (axial image 45 of series 2), a
4.5 x 3.0 cm caudate lesion (axial image 54 of series 2), and a
peripheral 3.3 x 2.6 cm lesion in segment 5 of the liver (axial
image 57 of series 2) which distorts the overlying liver capsule. No
intra or extrahepatic biliary ductal dilatation. Gallbladder is
normal in appearance.

Pancreas: No pancreatic mass. No pancreatic ductal dilatation. No
pancreatic or peripancreatic fluid collections or inflammatory
changes.

Spleen: Unremarkable.

Adrenals/Urinary Tract: Multiple low-attenuation lesions in the
right kidney, compatible with simple cysts, largest of which is in
the interpolar region measuring 6.6 cm in diameter. Left kidney and
left adrenal gland are normal in appearance. In the medial limb of
the right adrenal gland there is a 1.4 cm nodule which is similar to
the prior examination. No hydroureteronephrosis. Urinary bladder is
normal in appearance.

Stomach/Bowel: The appearance of the stomach is normal. There is no
pathologic dilatation of small bowel or colon. Small duodenal
diverticulum extending off the posterior aspect of the second
portion of the duodenum, without surrounding inflammatory changes to
suggest an associated diverticulitis at this time. A few scattered
colonic diverticulae are noted, without surrounding inflammatory
changes to indicate colonic diverticulitis at this time. Normal
appendix.

Vascular/Lymphatic: Aortic atherosclerosis, without evidence of
aneurysm or dissection in the abdominal or pelvic vasculature.
Mildly enlarged hepatoduodenal ligament lymph node (axial image 59
of series 2) measuring 1.5 cm in short axis. No other
lymphadenopathy noted in the abdomen or pelvis.

Reproductive: Prostate gland and seminal vesicles are unremarkable
in appearance.

Other: No significant volume of ascites.  No pneumoperitoneum.

Musculoskeletal: There are no aggressive appearing lytic or blastic
lesions noted in the visualized portions of the skeleton.
IMPRESSION: 1. Today's study demonstrates progression of disease, as
demonstrated by enlargement of multiple hypovascular hepatic lesions
highly concerning for metastatic lesions. Persistent mild
enlargement of hepatoduodenal ligament lymph node concerning for
additional site of metastatic involvement.
2. Previously noted right upper lobe pulmonary nodule is stable, as
are chronic postradiation changes in the right lung, as well as the
chronic moderate to large right pleural effusion.
3. Stable small right adrenal nodule.
4. Aortic atherosclerosis, in addition to left main and three-vessel
coronary artery disease. Assessment for potential risk factor
modification, dietary therapy or pharmacologic therapy may be
warranted, if clinically indicated.
5. Colonic diverticulosis without evidence of acute diverticulitis
at this time.
6. Additional incidental findings, as above.

## 2024-02-29 IMAGING — CT CT ABD-PELV W/ CM
2 of 5 series · 12 of 36 positions shown, 15 images · IV contrast (APPLIED)
Comparison: Multiple priors, most recently CT the chest, abdomen
and pelvis 02/06/2022.

CLINICAL DATA: 70-year-old male with history of right-sided small
cell lung cancer originally diagnosed 2 years ago. Radiation therapy
complete. Chemotherapy ongoing. Follow-up study.

* Tracking Code: BO *
EXAM:
CT CHEST, ABDOMEN, AND PELVIS WITH CONTRAST
TECHNIQUE: Multidetector CT imaging of the chest, abdomen and pelvis was
performed following the standard protocol during bolus
administration of intravenous contrast.

[Series 2: cap with · axial · 0.98mm/px · z∈[+1077,+1612]mm · 9 of 135 slices shown, 12 images]
[im 14/135  mediastinal]
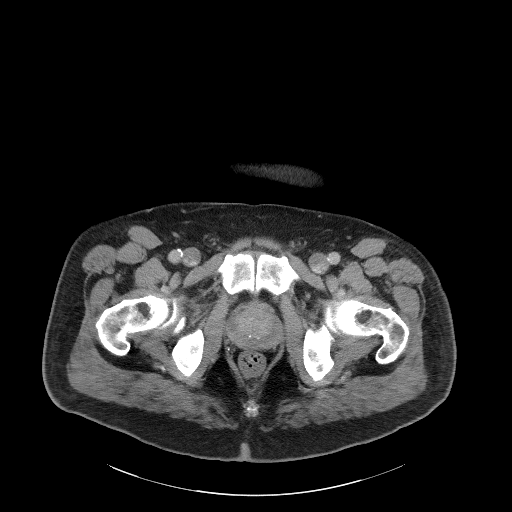
[im 14/135  lung]
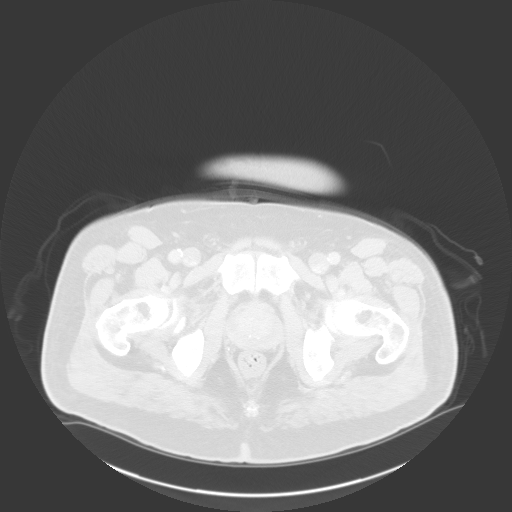
[im 27/135  lung]
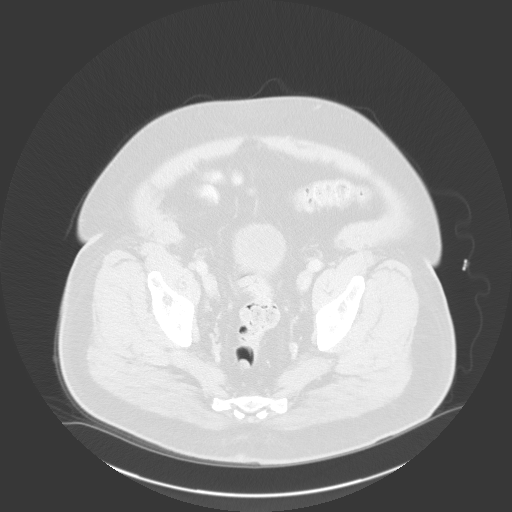
[im 41/135  lung]
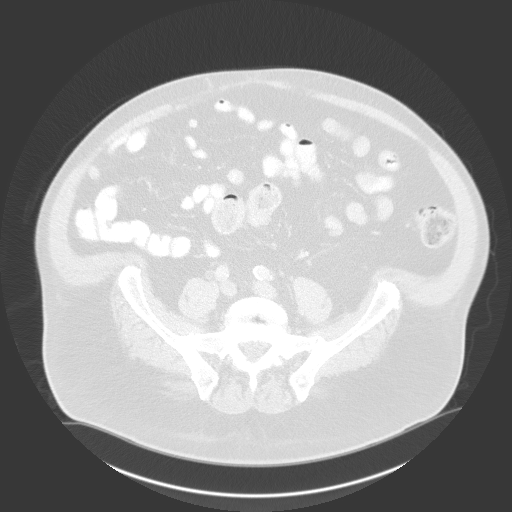
[im 54/135  lung]
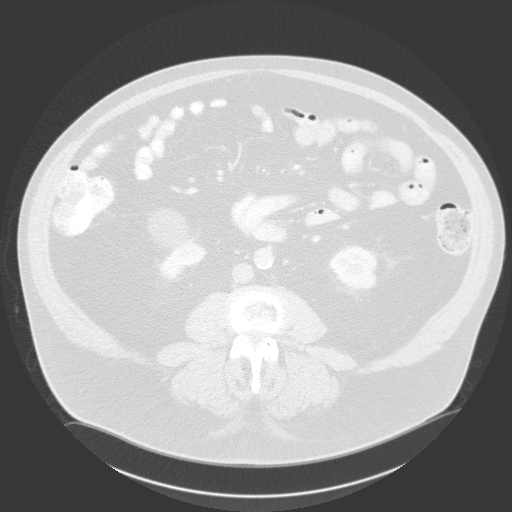
[im 68/135  mediastinal]
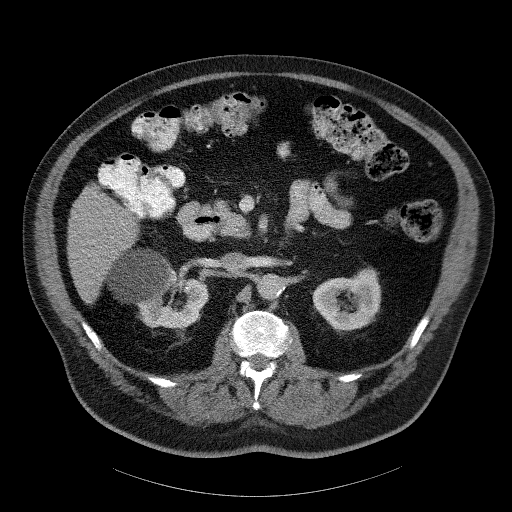
[im 68/135  lung]
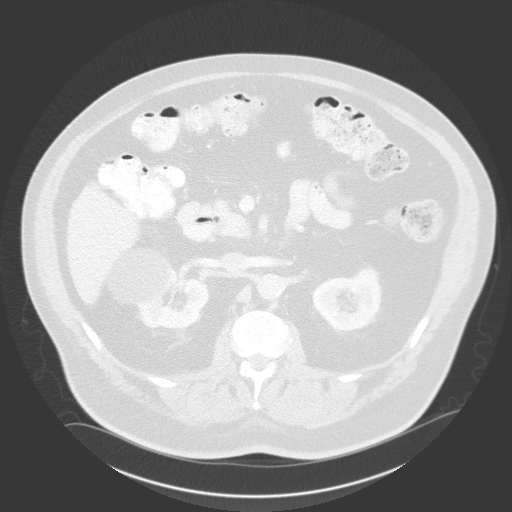
[im 81/135  lung]
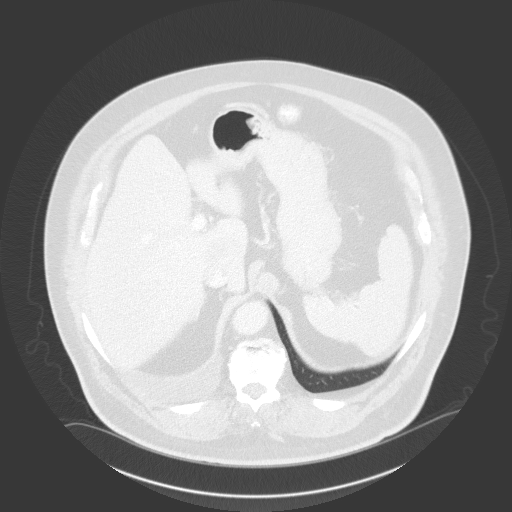
[im 94/135  lung]
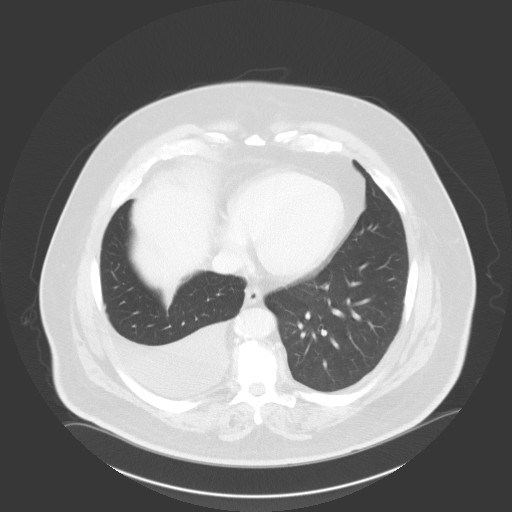
[im 108/135  lung]
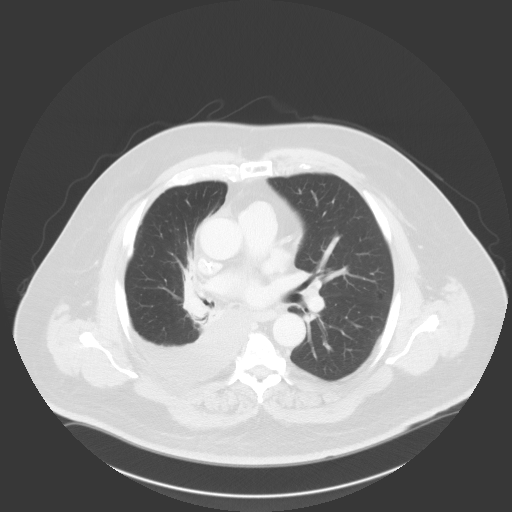
[im 121/135  mediastinal]
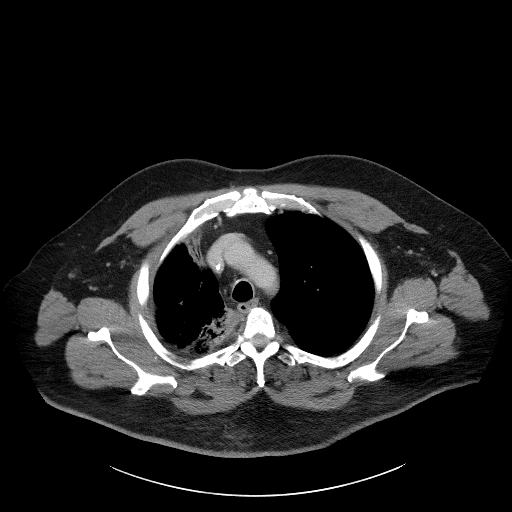
[im 121/135  lung]
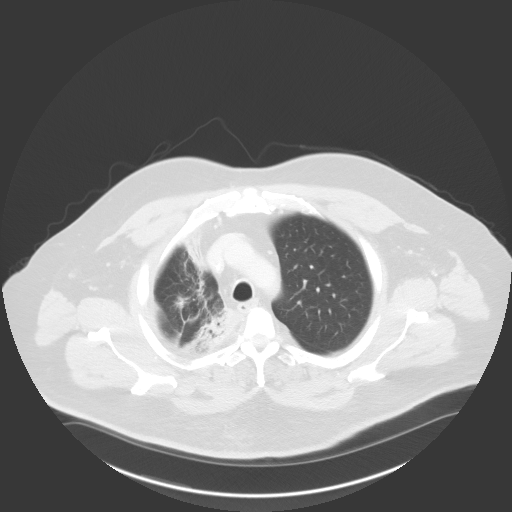

[Series 5: coronals · coronal · 0.96mm/px · 3 of 206 slices shown]
[im 42/206  lung]
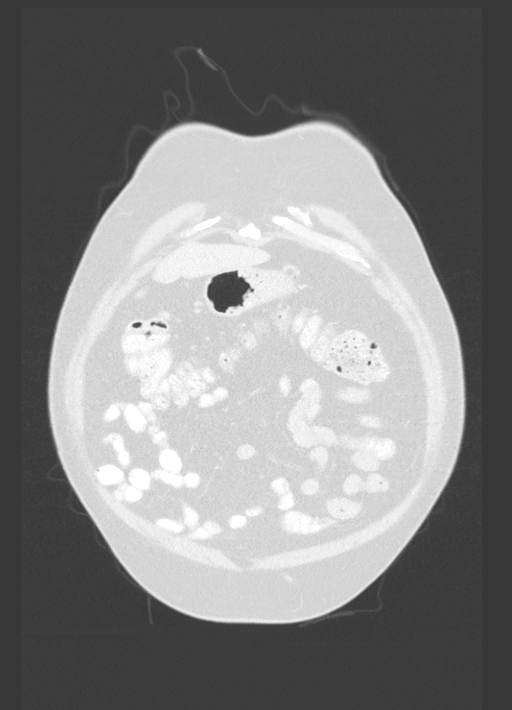
[im 83/206  lung]
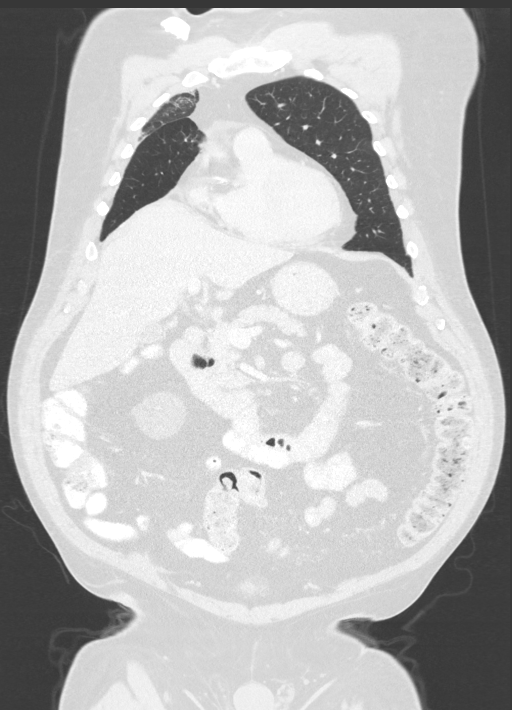
[im 124/206  lung]
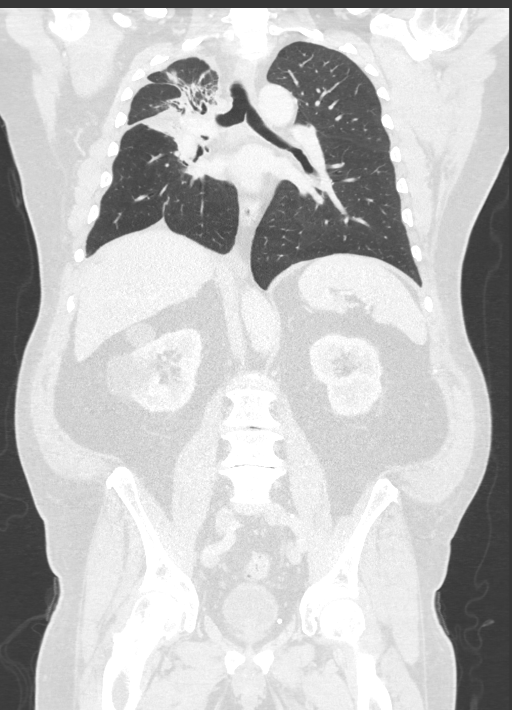

[12 of 36 positions shown; findings below may reference images not displayed]

RADIATION DOSE REDUCTION: This exam was performed according to the
departmental dose-optimization program which includes automated
exposure control, adjustment of the mA and/or kV according to
patient size and/or use of iterative reconstruction technique.

CONTRAST:  100mL OMNIPAQUE IOHEXOL 300 MG/ML  SOLN
FINDINGS: CT CHEST FINDINGS

Cardiovascular: Heart size is normal. There is no significant
pericardial fluid, thickening or pericardial calcification. There is
aortic atherosclerosis, as well as atherosclerosis of the great
vessels of the mediastinum and the coronary arteries, including
calcified atherosclerotic plaque in the left main, left anterior
descending, left circumflex and right coronary arteries. Right
internal jugular single-lumen porta cath with tip terminating in the
right atrium.

Mediastinum/Nodes: No pathologically enlarged mediastinal or hilar
lymph nodes. Esophagus is unremarkable in appearance. No axillary
lymphadenopathy.

Lungs/Pleura: Extensive architectural distortion and chronic volume
loss is noted in the perihilar aspect of the right mid to upper
lung, similar to the prior examination, most compatible with areas
of chronic postradiation fibrosis. Associated with this is a
moderate to large chronic right pleural effusion lying dependently
which is similar to the prior study. 1.2 cm nodule in the right
upper lobe near the apex (axial image 34 of series 4), stable. No
definite new suspicious appearing pulmonary nodules or masses are
noted. No left pleural effusion.

Musculoskeletal: There are no aggressive appearing lytic or blastic
lesions noted in the visualized portions of the skeleton.

CT ABDOMEN PELVIS FINDINGS

Hepatobiliary: Several hypovascular hepatic lesions are increased in
size compared to the prior examination, concerning for progressive
metastatic disease to the liver. These include a 3.5 x 2.7 cm lesion
in the central aspect of segment [DATE] (axial image 45 of series 2), a
4.5 x 3.0 cm caudate lesion (axial image 54 of series 2), and a
peripheral 3.3 x 2.6 cm lesion in segment 5 of the liver (axial
image 57 of series 2) which distorts the overlying liver capsule. No
intra or extrahepatic biliary ductal dilatation. Gallbladder is
normal in appearance.

Pancreas: No pancreatic mass. No pancreatic ductal dilatation. No
pancreatic or peripancreatic fluid collections or inflammatory
changes.

Spleen: Unremarkable.

Adrenals/Urinary Tract: Multiple low-attenuation lesions in the
right kidney, compatible with simple cysts, largest of which is in
the interpolar region measuring 6.6 cm in diameter. Left kidney and
left adrenal gland are normal in appearance. In the medial limb of
the right adrenal gland there is a 1.4 cm nodule which is similar to
the prior examination. No hydroureteronephrosis. Urinary bladder is
normal in appearance.

Stomach/Bowel: The appearance of the stomach is normal. There is no
pathologic dilatation of small bowel or colon. Small duodenal
diverticulum extending off the posterior aspect of the second
portion of the duodenum, without surrounding inflammatory changes to
suggest an associated diverticulitis at this time. A few scattered
colonic diverticulae are noted, without surrounding inflammatory
changes to indicate colonic diverticulitis at this time. Normal
appendix.

Vascular/Lymphatic: Aortic atherosclerosis, without evidence of
aneurysm or dissection in the abdominal or pelvic vasculature.
Mildly enlarged hepatoduodenal ligament lymph node (axial image 59
of series 2) measuring 1.5 cm in short axis. No other
lymphadenopathy noted in the abdomen or pelvis.

Reproductive: Prostate gland and seminal vesicles are unremarkable
in appearance.

Other: No significant volume of ascites.  No pneumoperitoneum.

Musculoskeletal: There are no aggressive appearing lytic or blastic
lesions noted in the visualized portions of the skeleton.
IMPRESSION: 1. Today's study demonstrates progression of disease, as
demonstrated by enlargement of multiple hypovascular hepatic lesions
highly concerning for metastatic lesions. Persistent mild
enlargement of hepatoduodenal ligament lymph node concerning for
additional site of metastatic involvement.
2. Previously noted right upper lobe pulmonary nodule is stable, as
are chronic postradiation changes in the right lung, as well as the
chronic moderate to large right pleural effusion.
3. Stable small right adrenal nodule.
4. Aortic atherosclerosis, in addition to left main and three-vessel
coronary artery disease. Assessment for potential risk factor
modification, dietary therapy or pharmacologic therapy may be
warranted, if clinically indicated.
5. Colonic diverticulosis without evidence of acute diverticulitis
at this time.
6. Additional incidental findings, as above.

## 2024-02-29 MED ORDER — HEPARIN SOD (PORK) LOCK FLUSH 100 UNIT/ML IV SOLN
500.0000 [IU] | Freq: Once | INTRAVENOUS | Status: AC | PRN
Start: 2024-02-29 — End: 2024-02-29
  Administered 2024-02-29: 500 [IU]

## 2024-02-29 MED ORDER — SODIUM CHLORIDE 0.9 % IV SOLN
INTRAVENOUS | Status: DC
Start: 2024-02-29 — End: 2024-02-29

## 2024-02-29 MED ORDER — NYSTATIN 100000 UNIT/ML MT SUSP: 5.0000 mL | Freq: Four times a day (QID) | OROMUCOSAL | 0 refills | Status: DC

## 2024-02-29 MED ORDER — DEXTROSE 5 % IV SOLN
240.0000 mg/m2 | Freq: Once | INTRAVENOUS | Status: AC
  Administered 2024-02-29: 600 mg via INTRAVENOUS
  Filled 2024-02-29: qty 40

## 2024-02-29 MED ORDER — SODIUM CHLORIDE 0.9% FLUSH
10.0000 mL | Freq: Once | INTRAVENOUS | Status: AC
  Administered 2024-02-29: 10 mL

## 2024-02-29 MED ORDER — SODIUM CHLORIDE 0.9% FLUSH
10.0000 mL | INTRAVENOUS | Status: DC | PRN
  Administered 2024-02-29: 10 mL

## 2024-02-29 MED ORDER — PROCHLORPERAZINE MALEATE 10 MG PO TABS
10.0000 mg | ORAL_TABLET | Freq: Once | ORAL | Status: AC
  Administered 2024-02-29: 10 mg via ORAL
  Filled 2024-02-29: qty 1

## 2024-02-29 MED ORDER — SODIUM CHLORIDE 0.9 % IV SOLN
1.0000 mg/m2 | Freq: Once | INTRAVENOUS | Status: AC
  Administered 2024-02-29: 2.4 mg via INTRAVENOUS
  Filled 2024-02-29: qty 2.4

## 2024-02-29 NOTE — Patient Instructions (Signed)
 [facility] - A DEPT OF [facility]  Discharge Instructions: Thank you for choosing [facility] to provide your oncology and hematology care.   If you have a lab appointment with the Cancer Center, please go directly to the Cancer Center and check in at the registration area.   Wear comfortable clothing and clothing appropriate for easy access to any Portacath or PICC line.   We strive to give you quality time with your provider. You may need to reschedule your appointment if you arrive late (15 or more minutes).  Arriving late affects you and other patients whose appointments are after yours.  Also, if you miss three or more appointments without notifying the office, you may be dismissed from the clinic at the provider's discretion.      For prescription refill requests, have your pharmacy contact our office and allow 72 hours for refills to be completed.    Today you received the following chemotherapy and/or immunotherapy agents: Topotecan.      To help prevent nausea and vomiting after your treatment, we encourage you to take your nausea medication as directed.  BELOW ARE SYMPTOMS THAT SHOULD BE REPORTED IMMEDIATELY: *FEVER GREATER THAN 100.4 F (38 C) OR HIGHER *CHILLS OR SWEATING *NAUSEA AND VOMITING THAT IS NOT CONTROLLED WITH YOUR NAUSEA MEDICATION *UNUSUAL SHORTNESS OF BREATH *UNUSUAL BRUISING OR BLEEDING *URINARY PROBLEMS (pain or burning when urinating, or frequent urination) *BOWEL PROBLEMS (unusual diarrhea, constipation, pain near the anus) TENDERNESS IN MOUTH AND THROAT WITH OR WITHOUT PRESENCE OF ULCERS (sore throat, sores in mouth, or a toothache) UNUSUAL RASH, SWELLING OR PAIN  UNUSUAL VAGINAL DISCHARGE OR ITCHING   Items with * indicate a potential emergency and should be followed up as soon as possible or go to the Emergency Department if any problems should occur.  Please show the CHEMOTHERAPY ALERT CARD or IMMUNOTHERAPY  ALERT CARD at check-in to the Emergency Department and triage nurse.  Should you have questions after your visit or need to cancel or reschedule your appointment, please contact [facility] - A DEPT OF [facility]  Dept: [phone]  and follow the prompts.  Office hours are 8:00 a.m. to 4:30 p.m. Monday - Friday. Please note that voicemails left after 4:00 p.m. may not be returned until the following business day.  We are closed weekends and major holidays. You have access to a nurse at all times for urgent questions. Please call the main number to the clinic Dept: [phone] and follow the prompts.   For any non-urgent questions, you may also contact your provider using MyChart. We now offer e-Visits for anyone 3 and older to request care online for non-urgent symptoms. For details visit [email or web address].   Also download the MyChart app! Go to the app store, search "MyChart", open the app, select [place], and log in with your MyChart username and password.

## 2024-03-01 ENCOUNTER — Inpatient Hospital Stay: Payer: Medicare Other

## 2024-03-01 ENCOUNTER — Encounter: Payer: Self-pay | Admitting: Internal Medicine

## 2024-03-01 VITALS — BP 108/72 | HR 88 | Temp 98.2°F | Resp 18

## 2024-03-01 DIAGNOSIS — C3411 Malignant neoplasm of upper lobe, right bronchus or lung: Secondary | ICD-10-CM

## 2024-03-01 DIAGNOSIS — C787 Secondary malignant neoplasm of liver and intrahepatic bile duct: Secondary | ICD-10-CM | POA: Diagnosis not present

## 2024-03-01 DIAGNOSIS — M545 Low back pain, unspecified: Secondary | ICD-10-CM | POA: Diagnosis not present

## 2024-03-01 DIAGNOSIS — E119 Type 2 diabetes mellitus without complications: Secondary | ICD-10-CM | POA: Diagnosis not present

## 2024-03-01 DIAGNOSIS — Z5111 Encounter for antineoplastic chemotherapy: Secondary | ICD-10-CM | POA: Diagnosis not present

## 2024-03-01 DIAGNOSIS — Z515 Encounter for palliative care: Secondary | ICD-10-CM | POA: Diagnosis not present

## 2024-03-01 MED ORDER — SODIUM CHLORIDE 0.9 % IV SOLN: INTRAVENOUS | Status: DC

## 2024-03-01 MED ORDER — TOPOTECAN HCL CHEMO INJECTION 4 MG
1.0000 mg/m2 | Freq: Once | INTRAVENOUS | Status: AC
  Administered 2024-03-01: 2.4 mg via INTRAVENOUS
  Filled 2024-03-01: qty 2.4

## 2024-03-01 MED ORDER — PROCHLORPERAZINE MALEATE 10 MG PO TABS
10.0000 mg | ORAL_TABLET | Freq: Once | ORAL | Status: AC
  Administered 2024-03-01: 10 mg via ORAL

## 2024-03-01 MED ORDER — TRILACICLIB DIHYDROCHLORIDE INJECTION 300 MG
240.0000 mg/m2 | Freq: Once | INTRAVENOUS | Status: AC
  Administered 2024-03-01: 600 mg via INTRAVENOUS
  Filled 2024-03-01: qty 40

## 2024-03-01 NOTE — Patient Instructions (Signed)
 [facility] - A DEPT OF [facility]  Discharge Instructions: Thank you for choosing [facility] to provide your oncology and hematology care.   If you have a lab appointment with the Cancer Center, please go directly to the Cancer Center and check in at the registration area.   Wear comfortable clothing and clothing appropriate for easy access to any Portacath or PICC line.   We strive to give you quality time with your provider. You may need to reschedule your appointment if you arrive late (15 or more minutes).  Arriving late affects you and other patients whose appointments are after yours.  Also, if you miss three or more appointments without notifying the office, you may be dismissed from the clinic at the provider's discretion.      For prescription refill requests, have your pharmacy contact our office and allow 72 hours for refills to be completed.    Today you received the following chemotherapy and/or immunotherapy agents: Topotecan.      To help prevent nausea and vomiting after your treatment, we encourage you to take your nausea medication as directed.  BELOW ARE SYMPTOMS THAT SHOULD BE REPORTED IMMEDIATELY: *FEVER GREATER THAN 100.4 F (38 C) OR HIGHER *CHILLS OR SWEATING *NAUSEA AND VOMITING THAT IS NOT CONTROLLED WITH YOUR NAUSEA MEDICATION *UNUSUAL SHORTNESS OF BREATH *UNUSUAL BRUISING OR BLEEDING *URINARY PROBLEMS (pain or burning when urinating, or frequent urination) *BOWEL PROBLEMS (unusual diarrhea, constipation, pain near the anus) TENDERNESS IN MOUTH AND THROAT WITH OR WITHOUT PRESENCE OF ULCERS (sore throat, sores in mouth, or a toothache) UNUSUAL RASH, SWELLING OR PAIN  UNUSUAL VAGINAL DISCHARGE OR ITCHING   Items with * indicate a potential emergency and should be followed up as soon as possible or go to the Emergency Department if any problems should occur.  Please show the CHEMOTHERAPY ALERT CARD or IMMUNOTHERAPY  ALERT CARD at check-in to the Emergency Department and triage nurse.  Should you have questions after your visit or need to cancel or reschedule your appointment, please contact [facility] - A DEPT OF [facility]  Dept: [phone]  and follow the prompts.  Office hours are 8:00 a.m. to 4:30 p.m. Monday - Friday. Please note that voicemails left after 4:00 p.m. may not be returned until the following business day.  We are closed weekends and major holidays. You have access to a nurse at all times for urgent questions. Please call the main number to the clinic Dept: [phone] and follow the prompts.   For any non-urgent questions, you may also contact your provider using MyChart. We now offer e-Visits for anyone 3 and older to request care online for non-urgent symptoms. For details visit [email or web address].   Also download the MyChart app! Go to the app store, search "MyChart", open the app, select [place], and log in with your MyChart username and password.

## 2024-03-02 ENCOUNTER — Encounter: Payer: Self-pay | Admitting: Internal Medicine

## 2024-03-02 ENCOUNTER — Inpatient Hospital Stay: Payer: Medicare Other

## 2024-03-02 VITALS — BP 110/72 | HR 78 | Temp 98.2°F | Resp 18

## 2024-03-02 DIAGNOSIS — C3411 Malignant neoplasm of upper lobe, right bronchus or lung: Secondary | ICD-10-CM | POA: Diagnosis not present

## 2024-03-02 DIAGNOSIS — M545 Low back pain, unspecified: Secondary | ICD-10-CM | POA: Diagnosis not present

## 2024-03-02 DIAGNOSIS — Z5111 Encounter for antineoplastic chemotherapy: Secondary | ICD-10-CM | POA: Diagnosis not present

## 2024-03-02 DIAGNOSIS — Z515 Encounter for palliative care: Secondary | ICD-10-CM | POA: Diagnosis not present

## 2024-03-02 DIAGNOSIS — C787 Secondary malignant neoplasm of liver and intrahepatic bile duct: Secondary | ICD-10-CM | POA: Diagnosis not present

## 2024-03-02 DIAGNOSIS — E119 Type 2 diabetes mellitus without complications: Secondary | ICD-10-CM | POA: Diagnosis not present

## 2024-03-02 MED ORDER — SODIUM CHLORIDE 0.9 % IV SOLN
INTRAVENOUS | Status: DC
Start: 2024-03-02 — End: 2024-03-02

## 2024-03-02 MED ORDER — PROCHLORPERAZINE MALEATE 10 MG PO TABS
10.0000 mg | ORAL_TABLET | Freq: Once | ORAL | Status: AC
  Administered 2024-03-02: 10 mg via ORAL
  Filled 2024-03-02: qty 1

## 2024-03-02 MED ORDER — TOPOTECAN HCL CHEMO INJECTION 4 MG
1.0000 mg/m2 | Freq: Once | INTRAVENOUS | Status: AC
  Administered 2024-03-02: 2.4 mg via INTRAVENOUS
  Filled 2024-03-02: qty 2.4

## 2024-03-02 MED ORDER — TRILACICLIB DIHYDROCHLORIDE INJECTION 300 MG
240.0000 mg/m2 | Freq: Once | INTRAVENOUS | Status: AC
  Administered 2024-03-02: 600 mg via INTRAVENOUS
  Filled 2024-03-02: qty 40

## 2024-03-02 NOTE — Telephone Encounter (Signed)
 Request sent for both Adam Wise and Peg to request eye exams.

## 2024-03-02 NOTE — Patient Instructions (Signed)
 [facility] - A DEPT OF [facility]  Discharge Instructions: Thank you for choosing [facility] to provide your oncology and hematology care.   If you have a lab appointment with the Cancer Center, please go directly to the Cancer Center and check in at the registration area.   Wear comfortable clothing and clothing appropriate for easy access to any Portacath or PICC line.   We strive to give you quality time with your provider. You may need to reschedule your appointment if you arrive late (15 or more minutes).  Arriving late affects you and other patients whose appointments are after yours.  Also, if you miss three or more appointments without notifying the office, you may be dismissed from the clinic at the provider's discretion.      For prescription refill requests, have your pharmacy contact our office and allow 72 hours for refills to be completed.    Today you received the following chemotherapy and/or immunotherapy agents: Topotecan.      To help prevent nausea and vomiting after your treatment, we encourage you to take your nausea medication as directed.  BELOW ARE SYMPTOMS THAT SHOULD BE REPORTED IMMEDIATELY: *FEVER GREATER THAN 100.4 F (38 C) OR HIGHER *CHILLS OR SWEATING *NAUSEA AND VOMITING THAT IS NOT CONTROLLED WITH YOUR NAUSEA MEDICATION *UNUSUAL SHORTNESS OF BREATH *UNUSUAL BRUISING OR BLEEDING *URINARY PROBLEMS (pain or burning when urinating, or frequent urination) *BOWEL PROBLEMS (unusual diarrhea, constipation, pain near the anus) TENDERNESS IN MOUTH AND THROAT WITH OR WITHOUT PRESENCE OF ULCERS (sore throat, sores in mouth, or a toothache) UNUSUAL RASH, SWELLING OR PAIN  UNUSUAL VAGINAL DISCHARGE OR ITCHING   Items with * indicate a potential emergency and should be followed up as soon as possible or go to the Emergency Department if any problems should occur.  Please show the CHEMOTHERAPY ALERT CARD or IMMUNOTHERAPY  ALERT CARD at check-in to the Emergency Department and triage nurse.  Should you have questions after your visit or need to cancel or reschedule your appointment, please contact [facility] - A DEPT OF [facility]  Dept: [phone]  and follow the prompts.  Office hours are 8:00 a.m. to 4:30 p.m. Monday - Friday. Please note that voicemails left after 4:00 p.m. may not be returned until the following business day.  We are closed weekends and major holidays. You have access to a nurse at all times for urgent questions. Please call the main number to the clinic Dept: [phone] and follow the prompts.   For any non-urgent questions, you may also contact your provider using MyChart. We now offer e-Visits for anyone 3 and older to request care online for non-urgent symptoms. For details visit [email or web address].   Also download the MyChart app! Go to the app store, search "MyChart", open the app, select [place], and log in with your MyChart username and password.

## 2024-03-03 ENCOUNTER — Inpatient Hospital Stay: Payer: Medicare Other

## 2024-03-03 ENCOUNTER — Other Ambulatory Visit: Payer: Self-pay | Admitting: Primary Care

## 2024-03-03 VITALS — BP 116/70 | HR 99 | Temp 98.2°F | Resp 18

## 2024-03-03 DIAGNOSIS — J9611 Chronic respiratory failure with hypoxia: Secondary | ICD-10-CM

## 2024-03-03 DIAGNOSIS — Z515 Encounter for palliative care: Secondary | ICD-10-CM | POA: Diagnosis not present

## 2024-03-03 DIAGNOSIS — C3411 Malignant neoplasm of upper lobe, right bronchus or lung: Secondary | ICD-10-CM

## 2024-03-03 DIAGNOSIS — C787 Secondary malignant neoplasm of liver and intrahepatic bile duct: Secondary | ICD-10-CM | POA: Diagnosis not present

## 2024-03-03 DIAGNOSIS — E119 Type 2 diabetes mellitus without complications: Secondary | ICD-10-CM | POA: Diagnosis not present

## 2024-03-03 DIAGNOSIS — M545 Low back pain, unspecified: Secondary | ICD-10-CM | POA: Diagnosis not present

## 2024-03-03 DIAGNOSIS — Z5111 Encounter for antineoplastic chemotherapy: Secondary | ICD-10-CM | POA: Diagnosis not present

## 2024-03-03 MED ORDER — PROCHLORPERAZINE MALEATE 10 MG PO TABS
10.0000 mg | ORAL_TABLET | Freq: Once | ORAL | Status: AC
  Administered 2024-03-03: 10 mg via ORAL
  Filled 2024-03-03: qty 1

## 2024-03-03 MED ORDER — TOPOTECAN HCL CHEMO INJECTION 4 MG
1.0000 mg/m2 | Freq: Once | INTRAVENOUS | Status: AC
  Administered 2024-03-03: 2.4 mg via INTRAVENOUS
  Filled 2024-03-03: qty 2.4

## 2024-03-03 MED ORDER — TRILACICLIB DIHYDROCHLORIDE INJECTION 300 MG
240.0000 mg/m2 | Freq: Once | INTRAVENOUS | Status: AC
  Administered 2024-03-03: 600 mg via INTRAVENOUS
  Filled 2024-03-03: qty 40

## 2024-03-03 MED ORDER — SODIUM CHLORIDE 0.9 % IV SOLN: INTRAVENOUS | Status: DC

## 2024-03-03 NOTE — Patient Instructions (Signed)
 [facility] - A DEPT OF [facility]  Discharge Instructions: Thank you for choosing [facility] to provide your oncology and hematology care.   If you have a lab appointment with the Cancer Center, please go directly to the Cancer Center and check in at the registration area.   Wear comfortable clothing and clothing appropriate for easy access to any Portacath or PICC line.   We strive to give you quality time with your provider. You may need to reschedule your appointment if you arrive late (15 or more minutes).  Arriving late affects you and other patients whose appointments are after yours.  Also, if you miss three or more appointments without notifying the office, you may be dismissed from the clinic at the provider's discretion.      For prescription refill requests, have your pharmacy contact our office and allow 72 hours for refills to be completed.    Today you received the following chemotherapy and/or immunotherapy agents :  Topotecan/Cosela   To help prevent nausea and vomiting after your treatment, we encourage you to take your nausea medication as directed.  BELOW ARE SYMPTOMS THAT SHOULD BE REPORTED IMMEDIATELY: *FEVER GREATER THAN 100.4 F (38 C) OR HIGHER *CHILLS OR SWEATING *NAUSEA AND VOMITING THAT IS NOT CONTROLLED WITH YOUR NAUSEA MEDICATION *UNUSUAL SHORTNESS OF BREATH *UNUSUAL BRUISING OR BLEEDING *URINARY PROBLEMS (pain or burning when urinating, or frequent urination) *BOWEL PROBLEMS (unusual diarrhea, constipation, pain near the anus) TENDERNESS IN MOUTH AND THROAT WITH OR WITHOUT PRESENCE OF ULCERS (sore throat, sores in mouth, or a toothache) UNUSUAL RASH, SWELLING OR PAIN  UNUSUAL VAGINAL DISCHARGE OR ITCHING   Items with * indicate a potential emergency and should be followed up as soon as possible or go to the Emergency Department if any problems should occur.  Please show the CHEMOTHERAPY ALERT CARD or  IMMUNOTHERAPY ALERT CARD at check-in to the Emergency Department and triage nurse.  Should you have questions after your visit or need to cancel or reschedule your appointment, please contact [facility]  Dept: [phone]  and follow the prompts.  Office hours are 8:00 a.m. to 4:30 p.m. Monday - Friday. Please note that voicemails left after 4:00 p.m. may not be returned until the following business day.  We are closed weekends and major holidays. You have access to a nurse at all times for urgent questions. Please call the main number to the clinic Dept: [phone] and follow the prompts.   For any non-urgent questions, you may also contact your provider using MyChart. We now offer e-Visits for anyone 30 and older to request care online for non-urgent symptoms. For details visit [email or web address].   Also download the MyChart app! Go to the app store, search "MyChart", open the app, select [place], and log in with your MyChart username and password.

## 2024-03-04 ENCOUNTER — Inpatient Hospital Stay: Payer: Medicare Other

## 2024-03-04 VITALS — BP 122/76 | HR 100 | Temp 98.3°F | Resp 16

## 2024-03-04 DIAGNOSIS — C787 Secondary malignant neoplasm of liver and intrahepatic bile duct: Secondary | ICD-10-CM | POA: Diagnosis not present

## 2024-03-04 DIAGNOSIS — M545 Low back pain, unspecified: Secondary | ICD-10-CM | POA: Diagnosis not present

## 2024-03-04 DIAGNOSIS — E119 Type 2 diabetes mellitus without complications: Secondary | ICD-10-CM | POA: Diagnosis not present

## 2024-03-04 DIAGNOSIS — C3411 Malignant neoplasm of upper lobe, right bronchus or lung: Secondary | ICD-10-CM

## 2024-03-04 DIAGNOSIS — Z515 Encounter for palliative care: Secondary | ICD-10-CM | POA: Diagnosis not present

## 2024-03-04 DIAGNOSIS — Z5111 Encounter for antineoplastic chemotherapy: Secondary | ICD-10-CM | POA: Diagnosis not present

## 2024-03-04 MED ORDER — SODIUM CHLORIDE 0.9 % IV SOLN
1.0000 mg/m2 | Freq: Once | INTRAVENOUS | Status: AC
  Administered 2024-03-04: 2.4 mg via INTRAVENOUS
  Filled 2024-03-04: qty 2.4

## 2024-03-04 MED ORDER — DEXTROSE 5 % IV SOLN
240.0000 mg/m2 | Freq: Once | INTRAVENOUS | Status: AC
  Administered 2024-03-04: 600 mg via INTRAVENOUS
  Filled 2024-03-04: qty 40

## 2024-03-04 MED ORDER — SODIUM CHLORIDE 0.9 % IV SOLN: INTRAVENOUS | Status: DC

## 2024-03-04 MED ORDER — PROCHLORPERAZINE MALEATE 10 MG PO TABS
10.0000 mg | ORAL_TABLET | Freq: Once | ORAL | Status: AC
  Administered 2024-03-04: 10 mg via ORAL
  Filled 2024-03-04: qty 1

## 2024-03-04 NOTE — Patient Instructions (Signed)
 [facility]  Discharge Instructions: Thank you for choosing [facility] to provide your oncology and hematology care.   If you have a lab appointment with the Cancer Center, please go directly to the Cancer Center and check in at the registration area.   Wear comfortable clothing and clothing appropriate for easy access to any Portacath or PICC line.   We strive to give you quality time with your provider. You may need to reschedule your appointment if you arrive late (15 or more minutes).  Arriving late affects you and other patients whose appointments are after yours.  Also, if you miss three or more appointments without notifying the office, you may be dismissed from the clinic at the provider's discretion.      For prescription refill requests, have your pharmacy contact our office and allow 72 hours for refills to be completed.    Today you received the following chemotherapy and/or immunotherapy agents cosela, topotecan      To help prevent nausea and vomiting after your treatment, we encourage you to take your nausea medication as directed.  BELOW ARE SYMPTOMS THAT SHOULD BE REPORTED IMMEDIATELY: *FEVER GREATER THAN 100.4 F (38 C) OR HIGHER *CHILLS OR SWEATING *NAUSEA AND VOMITING THAT IS NOT CONTROLLED WITH YOUR NAUSEA MEDICATION *UNUSUAL SHORTNESS OF BREATH *UNUSUAL BRUISING OR BLEEDING *URINARY PROBLEMS (pain or burning when urinating, or frequent urination) *BOWEL PROBLEMS (unusual diarrhea, constipation, pain near the anus) TENDERNESS IN MOUTH AND THROAT WITH OR WITHOUT PRESENCE OF ULCERS (sore throat, sores in mouth, or a toothache) UNUSUAL RASH, SWELLING OR PAIN  UNUSUAL VAGINAL DISCHARGE OR ITCHING   Items with * indicate a potential emergency and should be followed up as soon as possible or go to the Emergency Department if any problems should occur.  Please show the CHEMOTHERAPY ALERT CARD or  IMMUNOTHERAPY ALERT CARD at check-in to the Emergency Department and triage nurse.  Should you have questions after your visit or need to cancel or reschedule your appointment, please contact [facility]  Dept: [phone]  and follow the prompts.  Office hours are 8:00 a.m. to 4:30 p.m. Monday - Friday. Please note that voicemails left after 4:00 p.m. may not be returned until the following business day.  We are closed weekends and major holidays. You have access to a nurse at all times for urgent questions. Please call the main number to the clinic Dept: [phone] and follow the prompts.   For any non-urgent questions, you may also contact your provider using MyChart. We now offer e-Visits for anyone 40 and older to request care online for non-urgent symptoms. For details visit [email or web address].   Also download the MyChart app! Go to the app store, search "MyChart", open the app, select [place], and log in with your MyChart username and password.

## 2024-03-07 ENCOUNTER — Inpatient Hospital Stay: Payer: Medicare Other

## 2024-03-07 ENCOUNTER — Inpatient Hospital Stay

## 2024-03-07 DIAGNOSIS — Z5111 Encounter for antineoplastic chemotherapy: Secondary | ICD-10-CM | POA: Diagnosis not present

## 2024-03-07 DIAGNOSIS — E119 Type 2 diabetes mellitus without complications: Secondary | ICD-10-CM | POA: Diagnosis not present

## 2024-03-07 DIAGNOSIS — C3411 Malignant neoplasm of upper lobe, right bronchus or lung: Secondary | ICD-10-CM | POA: Diagnosis not present

## 2024-03-07 DIAGNOSIS — M545 Low back pain, unspecified: Secondary | ICD-10-CM | POA: Diagnosis not present

## 2024-03-07 DIAGNOSIS — C787 Secondary malignant neoplasm of liver and intrahepatic bile duct: Secondary | ICD-10-CM | POA: Diagnosis not present

## 2024-03-07 DIAGNOSIS — Z515 Encounter for palliative care: Secondary | ICD-10-CM | POA: Diagnosis not present

## 2024-03-07 LAB — CMP (CANCER CENTER ONLY)
ALT: 10 U/L (ref 0–44)
AST: 18 U/L (ref 15–41)
Albumin: 3.7 g/dL (ref 3.5–5.0)
Alkaline Phosphatase: 75 U/L (ref 38–126)
Anion gap: 5 (ref 5–15)
BUN: 14 mg/dL (ref 8–23)
CO2: 30 mmol/L (ref 22–32)
Calcium: 8.6 mg/dL — ABNORMAL LOW (ref 8.9–10.3)
Chloride: 103 mmol/L (ref 98–111)
Creatinine: 1.05 mg/dL (ref 0.61–1.24)
GFR, Estimated: >60 mL/min (ref >60)
Glucose, Bld: 171 mg/dL — ABNORMAL HIGH (ref 70–99)
Potassium: 4.9 mmol/L (ref 3.5–5.1)
Sodium: 138 mmol/L (ref 135–145)
Total Bilirubin: 0.6 mg/dL (ref 0.0–1.2)
Total Protein: 6.1 g/dL — ABNORMAL LOW (ref 6.5–8.1)

## 2024-03-07 LAB — CBC WITH DIFFERENTIAL (CANCER CENTER ONLY)
Abs Immature Granulocytes: 0.01 10*3/uL (ref 0.00–0.07)
Basophils Absolute: 0 10*3/uL (ref 0.0–0.1)
Basophils Relative: 1 %
Eosinophils Absolute: 0.1 10*3/uL (ref 0.0–0.5)
Eosinophils Relative: 3 %
HCT: 26.2 % — ABNORMAL LOW (ref 39.0–52.0)
Hemoglobin: 8.2 g/dL — ABNORMAL LOW (ref 13.0–17.0)
Immature Granulocytes: 0 %
Lymphocytes Relative: 13 %
Lymphs Abs: 0.3 10*3/uL — ABNORMAL LOW (ref 0.7–4.0)
MCH: 29.8 pg (ref 26.0–34.0)
MCHC: 31.3 g/dL (ref 30.0–36.0)
MCV: 95.3 fL (ref 80.0–100.0)
Monocytes Absolute: 0 10*3/uL — ABNORMAL LOW (ref 0.1–1.0)
Monocytes Relative: 1 %
Neutro Abs: 1.8 10*3/uL (ref 1.7–7.7)
Neutrophils Relative %: 82 %
Platelet Count: 147 10*3/uL — ABNORMAL LOW (ref 150–400)
RBC: 2.75 MIL/uL — ABNORMAL LOW (ref 4.22–5.81)
RDW: 18.7 % — ABNORMAL HIGH (ref 11.5–15.5)
Smear Review: NORMAL
WBC Count: 2.3 10*3/uL — ABNORMAL LOW (ref 4.0–10.5)
nRBC: 0 % (ref 0.0–0.2)

## 2024-03-07 LAB — SAMPLE TO BLOOD BANK

## 2024-03-08 ENCOUNTER — Ambulatory Visit (HOSPITAL_COMMUNITY)
Admission: RE | Admit: 2024-03-08 | Discharge: 2024-03-08 | Disposition: A | Discharge status: Home / Self Care | Source: Ambulatory Visit | Attending: Physician Assistant | Admitting: Physician Assistant

## 2024-03-08 ENCOUNTER — Encounter: Payer: Self-pay | Admitting: Internal Medicine

## 2024-03-08 DIAGNOSIS — C787 Secondary malignant neoplasm of liver and intrahepatic bile duct: Secondary | ICD-10-CM | POA: Diagnosis not present

## 2024-03-08 DIAGNOSIS — C3411 Malignant neoplasm of upper lobe, right bronchus or lung: Secondary | ICD-10-CM | POA: Diagnosis not present

## 2024-03-08 DIAGNOSIS — C3490 Malignant neoplasm of unspecified part of unspecified bronchus or lung: Secondary | ICD-10-CM | POA: Diagnosis not present

## 2024-03-08 DIAGNOSIS — C779 Secondary and unspecified malignant neoplasm of lymph node, unspecified: Secondary | ICD-10-CM | POA: Diagnosis not present

## 2024-03-08 MED ORDER — HEPARIN SOD (PORK) LOCK FLUSH 100 UNIT/ML IV SOLN
500.0000 [IU] | Freq: Once | INTRAVENOUS | Status: AC
  Administered 2024-03-08: 500 [IU] via INTRAVENOUS

## 2024-03-08 MED ORDER — IOHEXOL 300 MG/ML  SOLN
100.0000 mL | Freq: Once | INTRAMUSCULAR | Status: AC | PRN
  Administered 2024-03-08: 100 mL via INTRAVENOUS

## 2024-03-14 ENCOUNTER — Inpatient Hospital Stay: Payer: Medicare Other

## 2024-03-14 ENCOUNTER — Ambulatory Visit: Payer: Medicare Other | Admitting: Internal Medicine

## 2024-03-14 ENCOUNTER — Other Ambulatory Visit: Payer: Medicare Other

## 2024-03-14 ENCOUNTER — Ambulatory Visit: Payer: Medicare Other

## 2024-03-14 ENCOUNTER — Inpatient Hospital Stay

## 2024-03-14 DIAGNOSIS — C3411 Malignant neoplasm of upper lobe, right bronchus or lung: Secondary | ICD-10-CM | POA: Diagnosis not present

## 2024-03-14 DIAGNOSIS — Z515 Encounter for palliative care: Secondary | ICD-10-CM | POA: Diagnosis not present

## 2024-03-14 DIAGNOSIS — Z5111 Encounter for antineoplastic chemotherapy: Secondary | ICD-10-CM | POA: Diagnosis not present

## 2024-03-14 DIAGNOSIS — C787 Secondary malignant neoplasm of liver and intrahepatic bile duct: Secondary | ICD-10-CM | POA: Diagnosis not present

## 2024-03-14 DIAGNOSIS — M545 Low back pain, unspecified: Secondary | ICD-10-CM | POA: Diagnosis not present

## 2024-03-14 DIAGNOSIS — E119 Type 2 diabetes mellitus without complications: Secondary | ICD-10-CM | POA: Diagnosis not present

## 2024-03-14 LAB — CMP (CANCER CENTER ONLY)
ALT: 13 U/L (ref 0–44)
AST: 20 U/L (ref 15–41)
Albumin: 3.7 g/dL (ref 3.5–5.0)
Alkaline Phosphatase: 77 U/L (ref 38–126)
Anion gap: 7 (ref 5–15)
BUN: 15 mg/dL (ref 8–23)
CO2: 28 mmol/L (ref 22–32)
Calcium: 8.6 mg/dL — ABNORMAL LOW (ref 8.9–10.3)
Chloride: 106 mmol/L (ref 98–111)
Creatinine: 1.27 mg/dL — ABNORMAL HIGH (ref 0.61–1.24)
GFR, Estimated: 60 mL/min — ABNORMAL LOW (ref >60)
Glucose, Bld: 134 mg/dL — ABNORMAL HIGH (ref 70–99)
Potassium: 4.6 mmol/L (ref 3.5–5.1)
Sodium: 141 mmol/L (ref 135–145)
Total Bilirubin: 0.5 mg/dL (ref 0.0–1.2)
Total Protein: 6 g/dL — ABNORMAL LOW (ref 6.5–8.1)

## 2024-03-14 LAB — CBC WITH DIFFERENTIAL (CANCER CENTER ONLY)
Abs Immature Granulocytes: 0.01 10*3/uL (ref 0.00–0.07)
Basophils Absolute: 0 10*3/uL (ref 0.0–0.1)
Basophils Relative: 1 %
Eosinophils Absolute: 0.1 10*3/uL (ref 0.0–0.5)
Eosinophils Relative: 2 %
HCT: 27.4 % — ABNORMAL LOW (ref 39.0–52.0)
Hemoglobin: 8.4 g/dL — ABNORMAL LOW (ref 13.0–17.0)
Immature Granulocytes: 0 %
Lymphocytes Relative: 18 %
Lymphs Abs: 0.5 10*3/uL — ABNORMAL LOW (ref 0.7–4.0)
MCH: 29.5 pg (ref 26.0–34.0)
MCHC: 30.7 g/dL (ref 30.0–36.0)
MCV: 96.1 fL (ref 80.0–100.0)
Monocytes Absolute: 0.4 10*3/uL (ref 0.1–1.0)
Monocytes Relative: 15 %
Neutro Abs: 1.6 10*3/uL — ABNORMAL LOW (ref 1.7–7.7)
Neutrophils Relative %: 64 %
Platelet Count: 51 10*3/uL — ABNORMAL LOW (ref 150–400)
RBC: 2.85 MIL/uL — ABNORMAL LOW (ref 4.22–5.81)
RDW: 19.9 % — ABNORMAL HIGH (ref 11.5–15.5)
WBC Count: 2.6 10*3/uL — ABNORMAL LOW (ref 4.0–10.5)
nRBC: 0 % (ref 0.0–0.2)

## 2024-03-14 LAB — SAMPLE TO BLOOD BANK

## 2024-03-15 ENCOUNTER — Ambulatory Visit: Payer: Medicare Other

## 2024-03-16 ENCOUNTER — Ambulatory Visit: Payer: Medicare Other

## 2024-03-17 ENCOUNTER — Ambulatory Visit: Payer: Medicare Other

## 2024-03-17 ENCOUNTER — Ambulatory Visit

## 2024-03-17 VITALS — Ht 69.0 in | Wt 245.0 lb

## 2024-03-17 DIAGNOSIS — Z0000 Encounter for general adult medical examination without abnormal findings: Secondary | ICD-10-CM

## 2024-03-17 NOTE — Progress Notes (Signed)
 [facility] OFFICE PROGRESS NOTE  Shahad Mazurek, Johnette Abraham, PA-C [address] [place] [address]  DIAGNOSIS: Relapsed extensive stage (T4, N3, M1c)  small cell lung cancer diagnosed in April 2021 and presented with large right upper lobe/right hilar mass with ipsilateral and contralateral mediastinal and right supraclavicular lymphadenopathy in addition to multiple liver lesions. He was initially diagnosed in December 2021.   PRIOR THERAPY: 1) Palliative radiotherapy to the right upper lobe lung mass under the care of Dr. Mitzi Hansen. 2) Systemic chemotherapy with carboplatin for AUC of 5 on day 1, etoposide 100 mg/M2 on days 1, 2 and 3 in addition to Imfinzi 1500 mg IV every 3 weeks with chemotherapy then every 4 weeks for maintenance if the patient has no evidence for progression.  He will also receive Cosela 240 mg/m2 on the days of the chemotherapy.  Status post 9 cycles.  Starting from cycle #5 the patient will be on maintenance treatment with immunotherapy with Imfinzi 1500 mg IV every 4 weeks. Last dose of chemotherapy was given on November 13, 2020. This treatment was discontinued secondary to disease progression 3) Systemic chemotherapy with carboplatin for AUC of 5 on day 1, etoposide 100 mg/M2 on days 1, 2 and 3, Tecentriq 1200 mg IV every 3 weeks as well as Cosela 250 mg/M2 on the days of the chemotherapy every 3 weeks.  First dose December 18, 2020.  Status post 8 cycles. 4) Zepzelca (lurbinectedin) 3.2 mgm2 IV every 3 weeks. Last dose on 01/23/22. Status post 12 cycles.  5) Palliative systemic chemotherapy with irinotecan 65 mg/m2 on days 1 and 8 IV every 3 weeks.  Status post 3 cycles.  Last dose was given April 01, 2022 discontinued secondary to disease progression. 6) SBRT to the progressive liver lesions under the care of Dr. Mitzi Hansen.  Last fraction January 27, 2023. 7) systemic chemotherapy with carboplatin for AUC of 5 on day 1, etoposide 100 Mg/M2 on days 1, 2 and  3 with Cosela before the chemotherapy.  First dose expected to start on 05/28/2022.  Status post 15 cycles.  Starting from cycle #5 his carboplatin will be reduced to AUC of 4 and 2 etoposide 80 Mg/M2.  Last dose was giving April 07, 2023 discontinued for suspicious disease progression. 8) tarlatamab (6/17-12/29) at [facility] 9) palliative radiation to the right hip under the care of Dr. Mitzi Hansen, plan to occur in January 2025  CURRENT THERAPY: 1) Topotecan 1.2 mg/M2 on days 1-5 every 3 weeks with Cosela 240 Mg/M2 IV before the chemotherapy.  First dose January 11, 2024. Status post 3 cycles.   INTERVAL HISTORY: Adam Wise 73 y.o. male returns to the clinic today for a follow-up visit accompanied by his wife.  The patient has extensive stage small cell lung cancer.  He has been through several lines of therapy.  He is currently on chemotherapy with topotecan.   He tolerates treatment fairly well except he has cytopenias on weekly labs. He denied any abnormal bleeding or bruising.  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   He denies any major changes in his health since he was last seen. He actually has been feeling better recently but of course continues to have fatigue.  He did mow his lawn recently. He denies any signs and symptoms of infection including fever, chills, nasal congestion, unusual cough, skin infections, diarrhea, or dysuria.   He follows closely with palliative care due to right hip pain.  His hip pain is  better at this time.  It was taxing for him to be on radiation and chemo to the hip a few weeks ago.  He is not requiring as much pain medication.  He has Remeron for an appetite stimulant.  He denies any fever, chills, or night sweats.  For breathing, his shortness of breath is still present stable.  He denies any new cough.  He denies any hemoptysis or chest pain.  Denies any nausea, vomiting, or diarrhea.  He previously was having constipation but he states that he "figured out" at this  time.  He denies any rashes or skin changes. Denies any headache or visual changes. He recently had a restaging CT scan performed. He is here today for evaluation repeat blood work before undergoing cycle #4.        MEDICAL HISTORY: Past Medical History:  Diagnosis Date   Acute on chronic respiratory failure with hypoxia (HCC) 10/12/2021   Chickenpox    Chronic knee pain    Chronic low back pain    COPD exacerbation (HCC) 10/11/2021   COPD with exacerbation (HCC) 10/12/2021   Coronary artery calcification seen on CAT scan 11/2021   Coronary CTA 11/27/2021: Moderate to large right pleural effusion and compressive atelectasis right lung base. => Coronary Calcium Score 657.  Diffuse RCA calcification.  Minimal mild disease in the LAD and diagonal branches. == Overall limited study.  Notable artifact.   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 He has disease progression after the first line of chemotherapy in December 2021.   Testosterone deficiency    Type 2 diabetes mellitus (HCC)     ALLERGIES:  is allergic to bupropion and hydrochlorothiazide.  MEDICATIONS:  Current Outpatient Medications  Medication Sig Dispense Refill   albuterol (VENTOLIN HFA) 108 (90 Base) MCG/ACT inhaler INHALE 2 PUFFS INTO THE LUNGS EVERY 4 (FOUR) HOURS AS NEEDED FOR SHORTNESS OF BREATH 18 each 0   amLODipine (NORVASC) 10 MG tablet Take 0.5 tablets (5 mg total) by mouth daily. TAKE 1 TABLET BY MOUTH EVERY DAY for blood pressure.     B-D 3CC LUER-LOK SYR 22GX1" 22G X 1" 3 ML MISC USE AS INSTRUCTED FOR  TESTOSTERONE INJECTION EVERY 2 WEEKS 10 each 2   Black Pepper-Turmeric (TURMERIC COMPLEX/BLACK PEPPER PO) Take 1 tablet by mouth in the morning and at bedtime.     Coenzyme Q10 (COQ10) 200 MG CAPS Take 200 mg by mouth at bedtime.     cyclobenzaprine (FLEXERIL) 5 MG tablet TAKE 1 TABLET BY MOUTH AT BEDTIME AS NEEDED FOR MUSCLE SPASMS. 90 tablet 0   docusate sodium (COLACE) 100 MG capsule Take 1 capsule (100 mg total) by mouth every 12 (twelve) hours. 60 capsule 0   ELIQUIS 5 MG TABS tablet TAKE 1 TABLET BY MOUTH TWICE A DAY 180 tablet 2   esomeprazole (NEXIUM) 20 MG capsule Take 20 mg by mouth in the morning.     gabapentin (NEURONTIN) 300 MG capsule TAKE 1 CAPSULE (300 MG TOTAL) BY MOUTH 2 (TWO) TIMES DAILY. FOR BACK PAIN. 180 capsule 1   glucose blood (ONETOUCH ULTRA) test strip  USE UP TO 4 TIMES DAILY AS DIRECTED 400 strip 5   Insulin Pen Needle (BD PEN NEEDLE NANO U/F) 32G X 4 MM MISC Use with insulin as prescribed Dx Code: E11.9 100 each 3   ipratropium-albuterol (DUONEB) 0.5-2.5 (3) MG/3ML SOLN Take 3 mLs by nebulization every 4 (four) hours while awake for 3 days, THEN 3 mLs every 4 (four) hours as needed (shortness of breath or wheezing). 360 mL 0   KRILL OIL PO Take 1 capsule by mouth daily.     Lancets MISC USE UP TO 3 TIMES DAILY AS DIRECTED 100 each 2   lidocaine (LIDODERM) 5 % Place 1 patch onto the skin daily. Remove & Discard patch within 12 hours or as directed by MD 30 patch 0   losartan (COZAAR) 50 MG tablet TAKE 1 TABLET (50 MG TOTAL) BY MOUTH DAILY. FOR BLOOD PRESSURE. 90 tablet 3   Melatonin 10 MG CAPS Take 10 mg by mouth at bedtime as needed (for sleep).     metFORMIN (GLUCOPHAGE) 1000 MG tablet TAKE 1 TABLET (1,000 MG TOTAL) BY MOUTH 2 (TWO) TIMES DAILY WITH A MEAL. FOR DIABETES 180 tablet 1   mirtazapine (REMERON) 15 MG tablet TAKE 1 TABLET BY MOUTH EVERYDAY AT BEDTIME 90 tablet 1   morphine (MS CONTIN) 30 MG 12 hr tablet Take 1 tablet (30 mg total) by mouth 2 (two) times  daily. 60 tablet 0   Multiple Vitamin (MULTI-VITAMINS) TABS Take 1 tablet by mouth daily with breakfast.     Naloxone HCl 3 MG/0.1ML LIQD Place 1 spray into both nostrils once. For known/suspected opiod overdose. Every 2-3 minutes in alternating nostril till EMS arrives.     nystatin (MYCOSTATIN) 100000 UNIT/ML suspension Take 5 mLs (500,000 Units total) by mouth 4 (four) times daily. 60 mL 0   ondansetron (ZOFRAN) 4 MG tablet Take 1 tablet (4 mg total) by mouth every 8 (eight) hours as needed for nausea or vomiting. 30 tablet 0   oxyCODONE-acetaminophen (PERCOCET) 10-325 MG tablet Take 1 tablet by mouth every 4 (four) hours as needed.     pioglitazone (ACTOS) 45 MG tablet TAKE 1 TABLET (45 MG TOTAL) BY MOUTH DAILY. FOR DIABETES. 90 tablet 1   pravastatin (PRAVACHOL) 40 MG tablet TAKE 1 TABLET BY MOUTH EVERY DAY IN THE EVENING FOR CHOLESTEROL 90 tablet 3   PRESCRIPTION MEDICATION CPAP- At bedtime     testosterone cypionate (DEPOTESTOSTERONE CYPIONATE) 200 MG/ML injection Inject 200 mg into the muscle every 14 (fourteen) days.     No current facility-administered medications for this visit.   Facility-Administered Medications Ordered in Other Visits  Medication Dose Route Frequency Provider Last Rate Last Admin   heparin lock flush 100 unit/mL  500 Units Intracatheter Once Si Gaul, MD       sodium chloride flush (NS) 0.9 % injection 10 mL  10 mL Intracatheter Once Si Gaul, MD        SURGICAL HISTORY:  Past Surgical History:  Procedure Laterality Date   CHEST TUBE INSERTION Right 01/20/2022   Procedure: CHEST TUBE INSERTION;  Surgeon: Lorin Glass, MD;  Location: [facility] ENDOSCOPY;  Service: Pulmonary;  Laterality: Right;  w/ Talc Pleurodesis, planned admit for Obs afterwards   CHEST TUBE INSERTION Right 04/28/2022   Procedure: INSERTION PLEURAL DRAINAGE CATHETER - Pigtail tail, drainage;  Surgeon: Lorin Glass, MD;  Location: [facility] ENDOSCOPY;  Service: Pulmonary;  Laterality:  Right;  talc pleurodesis   CHEST TUBE INSERTION Right 09/01/2022   Procedure:  INSERTION PLEURAL DRAINAGE CATHETER;  Surgeon: Lupita Leash, MD;  Location: [facility] ENDOSCOPY;  Service: Cardiopulmonary;  Laterality: Right;  pigtail catheter placement with talc pleurodesis   COLONOSCOPY WITH PROPOFOL N/A 12/17/2018   Procedure: COLONOSCOPY WITH PROPOFOL;  Surgeon: Pasty Spillers, MD;  Location: [facility] ENDOSCOPY;  Service: Gastroenterology;  Laterality: N/A;   IR IMAGING GUIDED PORT INSERTION  04/17/2020   IR THORACENTESIS ASP PLEURAL SPACE W/IMG GUIDE  11/29/2021   IR THORACENTESIS ASP PLEURAL SPACE W/IMG GUIDE  01/03/2022   JOINT REPLACEMENT Bilateral    REPLACEMENT TOTAL KNEE BILATERAL  2015   TALC PLEURODESIS  09/01/2022   Procedure: Lurlean Nanny;  Surgeon: Lupita Leash, MD;  Location: [facility] ENDOSCOPY;  Service: Cardiopulmonary;;   TONSILLECTOMY  1960   TRANSTHORACIC ECHOCARDIOGRAM  05/2020   a) 05/2020: EF 55 to 60%.  No R WMA.  Mild LVH.  Indeterminate LVEDP.  Unable to assess RVP.  Normal aortic and mitral valves.  Mildly elevated RAP.; b) 09/2021: EF 50-55%. No RWMA. Mild LVH. ~ LVEDP. Mild LA Dilation. NORMAL RV/RAP.  Normal MV/AoV.    REVIEW OF SYSTEMS:   Constitutional: Positive for stable fatigue. Negative for chills and fever.  HENT: Positive for taste alterations. Negative for mouth sores, sore throat and trouble swallowing.   Eyes: Negative for eye problems and icterus.  Respiratory: Positive for stable shortness of breath. Negative for hemoptysis and wheezing.   Cardiovascular: Negative for chest pain and leg swelling.  Gastrointestinal: Negative for abdominal pain, constipation, diarrhea, nausea and vomiting.  Genitourinary: Negative for bladder incontinence, difficulty urinating, dysuria, frequency and hematuria.   Musculoskeletal: Improved back pain/hip pain. Negative for gait problem, neck pain and neck stiffness.  Skin: Negative for itching and rash.  Neurological:  Negative for dizziness, extremity weakness, gait problem, headaches, light-headedness and seizures.  Hematological: Negative for adenopathy. Does not bruise/bleed easily.  Psychiatric/Behavioral: Negative for confusion, depression and sleep disturbance. The patient is not nervous/anxious.       PHYSICAL EXAMINATION:  Blood pressure 114/75, pulse (!) 101, temperature 97.6 F (36.4 C), temperature source Temporal, resp. rate 17, weight 250 lb 4.8 oz (113.5 kg), SpO2 94%.  ECOG PERFORMANCE STATUS: 1  Physical Exam  Constitutional: Oriented to person, place, and time and well-developed, well-nourished, and in no distress.  HENT:  Head: Normocephalic and atraumatic.  Mouth/Throat: Oropharynx is clear and moist. No oropharyngeal exudate.  Eyes: Conjunctivae are normal. Right eye exhibits no discharge. Left eye exhibits no discharge. No scleral icterus.  Neck: Normal range of motion. Neck supple.  Cardiovascular: Normal rate, regular rhythm, normal heart sounds and intact distal pulses.   Pulmonary/Chest: Effort normal. Quiet breath sounds in the right lung. No respiratory distress. No wheezes. No rales.  Abdominal: Soft. Bowel sounds are normal. Exhibits no distension and no mass. There is no tenderness.  Musculoskeletal: Normal range of motion. Exhibits no edema.  Lymphadenopathy:    No cervical adenopathy.  Neurological: Alert and oriented to person, place, and time. Exhibits normal muscle tone. Examined in the wheelchair.  Skin: Skin is warm and dry. No rash noted. Not diaphoretic. No erythema. No pallor.  Psychiatric: Mood, memory and judgment normal.  Vitals reviewed.    LABORATORY DATA: Lab Results  Component Value Date   WBC 3.3 (L) 03/21/2024   HGB 8.8 (L) 03/21/2024   HCT 28.9 (L) 03/21/2024   MCV 98.3 03/21/2024   PLT 246 03/21/2024      Chemistry      Component Value Date/Time  NA 140 03/21/2024 0931   K 4.2 03/21/2024 0931   CL 105 03/21/2024 0931   CO2 29  03/21/2024 0931   BUN 15 03/21/2024 0931   CREATININE 1.07 03/21/2024 0931   CREATININE 1.45 (H) 10/18/2021 1533      Component Value Date/Time   CALCIUM 8.2 (L) 03/21/2024 0931   ALKPHOS 79 03/21/2024 0931   AST 17 03/21/2024 0931   ALT 10 03/21/2024 0931   BILITOT 0.4 03/21/2024 0931       RADIOGRAPHIC STUDIES:  CT CHEST ABDOMEN PELVIS W CONTRAST Result Date: 03/18/2024 CLINICAL DATA:  Follow-up small cell lung cancer EXAM: CT CHEST, ABDOMEN, AND PELVIS WITH CONTRAST TECHNIQUE: Multidetector CT imaging of the chest, abdomen and pelvis was performed following the standard protocol during bolus administration of intravenous contrast. RADIATION DOSE REDUCTION: This exam was performed according to the departmental dose-optimization program which includes automated exposure control, adjustment of the mA and/or kV according to patient size and/or use of iterative reconstruction technique. CONTRAST:  OMNIPAQUE IOHEXOL 300 MG/ML  SOLN COMPARISON:  CT abdomen/pelvis dated 08/02/2023. CT chest dated 05/19/2023. FINDINGS: CT CHEST FINDINGS Cardiovascular: The heart is normal in size. Trace pericardial fluid. No evidence of thoracic aortic aneurysm. Atherosclerotic calcifications of the aortic arch. Moderate three-vessel coronary atherosclerosis. Right chest port terminates in the upper right atrium. Mediastinum/Nodes: No suspicious mediastinal lymphadenopathy. Visualized thyroid is unremarkable. Lungs/Pleura: Radiation changes with volume loss in the right hemithorax. No suspicious pulmonary nodules. No focal consolidation. Right pleural calcifications, likely reflecting prior talc pleurodesis. No pneumothorax. Musculoskeletal: Degenerative changes of the thoracic spine. CT ABDOMEN PELVIS FINDINGS Hepatobiliary: 7.8 x 5.7 cm right hepatic metastasis (series 2/image 45), previously 5.2 x 4.3 cm. Additional 16 mm lesion inferiorly in segment 5 (series 2/image 56), with adjacent fiducial markers and  surrounding radiation changes, previously 19 mm. Additional 18 mm lesion in the caudate, previously 2.2 cm. Gallbladder is unremarkable. No intrahepatic or extrahepatic ductal dilatation. Pancreas: Within normal limits. Spleen: Within normal limits. Adrenals/Urinary Tract: 17 mm right adrenal nodule (series 2/image 85), previously characterized as a benign adrenal adenoma. Left adrenal gland is within normal limits. Bilateral renal cysts, including a dominant 7.7 cm cyst in the right lower kidney (series 2/image 69), benign (Bosniak I). No follow-up is recommended. Thick-walled bladder, although underdistended. Stomach/Bowel: Stomach is within normal limits. No evidence of bowel obstruction.  Moderate duodenal diverticulum. Normal appendix (series 2/image 82). No colonic wall thickening or inflammatory changes. Vascular/Lymphatic: No evidence of abdominal aortic aneurysm. Atherosclerotic calcifications of the abdominal aorta and branch vessels, although vessels remain patent. Upper abdominal lymphadenopathy, including a dominant 2.3 cm short axis portacaval node (series 2/image 62), previously 2.1 cm. Reproductive: Mild enlargement of the central gland indenting the base the bladder, suggesting BPH. Other: No abdominopelvic ascites. No frank peritoneal nodularity/omental caking. Musculoskeletal: Mild degenerative changes of the lumbar spine. IMPRESSION: Radiation changes in the right hemithorax. No evidence of thoracic metastatic disease. 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 Associated upper abdominal nodal metastases, mildly progressive. Additional ancillary findings as above. Electronically Signed   By: Charline Bills M.D.   On: 03/18/2024 02:02     ASSESSMENT/PLAN:  This is a very pleasant 73 year old Caucasian male diagnosed with extensive stage (T4, N3, M1C) small cell lung cancer presented with large right upper lobe lung mass in addition to mediastinal and right supraclavicular  lymphadenopathy and multiple metastatic liver lesions diagnosed in April 2021.    The patient initially underwent systemic chemotherapy with  carboplatin for an AUC of 5 on day 1, etoposide 100 mg per metered squared on days 1, 2, and 3 in addition to [facility] for myeloprotection.  He also received immunotherapy with Imfinzi on day 1 of every chemotherapy cycle.  He is status post 8 cycles.  Starting from cycle #5 he was on single agent immunotherapy with Imfinzi IV every 4 weeks   The patient had evidence of disease progression on his scan from December 2021.   He was then started on systemic chemotherapy with carboplatin for an AUC of 5 on day 1, etoposide 100 mg per metered squared on days 1, 2, and 3 in addition to Tecentriq 1200 mg on day 1, the patient is status post 8 cycles.  Starting from cycle #5, the patient started maintenance immunotherapy with Tecentriq.  This was discontinued due to evidence of disease progression.   The patient then underwent Zepzelca IV every 3 weeks.  He is status post his 12 cycles and he tolerated it well except for fatigue a few days after treatment.  This was discontinued in February 2023 due to evidence of disease progression.    The patient then underwent treatment with irinotecan 65 mg per metered squared on days 1 and 8 IV every 3 weeks.  The patient is status post 3 cycles and tolerated it fairly well without any concerning adverse side effects.  This was discontinued secondary to disease progression.   Dr. Arbutus Ped saw the patient after this to discuss the options including palliative care and hospice versus single agent Gemzar, or Taxol.  He also discussed repeating his initial treatment with carboplatin and etoposide since he had a good response to treatment in the past.   He was referred to Dr. Janann August at [facility]  for consideration of enrollment in the clinical trial with tarlatamab.    He also saw Dr. Loney Hering at [facility] for consideration of enrollment  in the [place] [id number] trial.  The patient was not interested due to the travel burden.   The patient was interested in restarting systemic chemotherapy with carboplatin for an AUC of 5 on day 1 and etoposide 100 mg per metered squared on days 1, 2, and 3 with Cosela. He is status post 14 cycles and tolerated it well. His dose of carboplatin was reduced to an AUC of 4 and etoposide 80 mg per metered squared.     He underwent SBRT to this which was completed on 01/27/23.    The patient was seen by Dr. Janann August at [facility] and treated with tarlatamab which is a by specific T-cell engager (BiTE) for the last 8 months.  Unfortunately the repeat CT scan of the chest, abdomen and pelvis in December 2024 showed interval mild to moderate increase in the size of the segment 7 liver lesion in addition to interval mild increase in the size of portacaval and common hepatic artery lymph nodes with stable size retroperitoneal lymph nodes and stable size right adrenal nodule.  There was no evidence for disease progression in the chest.  There was focal enhancement in the right iliac bone concerning for metastatic disease.    He recently completed palliative radiation to the right iliac bone lesion in February 2025.    Dr. Arbutus Ped recommended Topotecan regimen: 5 days in a row every 3 weeks.  He underwent his three cycles of treatment on 01/11/2024.  He tolerated it fairly well except he did have some cytopenias.   The patient was seen with  Dr. Arbutus Ped today.  Dr. Arbutus Ped personally and independently reviewed the scan and discussed results with the patient today.  The scan showed no evidence of metastatic disease in the thorax but the dominant right hepatic metastasis is progressive and an associated upper abdominal nodal metastasis is also mildly progressive.  Dr. Arbutus Ped discussed with the patient today that once again he is an aggressive cancer and we are running out of systemic therapy options.  Dr. Arbutus Ped  recommended referral to radiation oncology to see if they could radiate the spot in the liver lesion.  He did previously have a liver lesion radiated in the past and is unclear if it was this lesion or a different lesion.  If they are unable to radiate this, then we would recommend seeing the patient back to discuss other systemic therapy options which likely would be Taxol.  The patient understands that we are running out of options and other options are less likely to be effective.    We will continue to monitor his labs closely on a weekly basis. Should his platelet count be less than 50,000, we would recommend holding his blood thinner until his platelet count is greater than 50.  We would also consider G-CSF injections if his ANC were less than 0.6.  I have added standing orders for sample to blood bank.    Labs were reviewed. Recommend that he proceed with day 1 cycle #4 today as scheduled.    He will continue taking Remeron to take 15 mg p.o. at night to help with his decreased appetite.      We will see him back for follow-up visit in 3 weeks for evaluation repeat blood work before undergoing cycle #5   The patient was advised to call immediately if he has any concerning symptoms in the interval. The patient voices understanding of current disease status and treatment options and is in agreement with the current care plan. All questions were answered. The patient knows to call the clinic with any problems, questions or concerns. We can certainly see the patient much sooner if necessary      Orders Placed This Encounter  Procedures   Ambulatory referral to Radiation Oncology    Referral Priority:   Routine    Referral Type:   Consultation    Referral Reason:   Specialty Services Required    Requested Specialty:   Radiation Oncology    Number of Visits Requested:   1   Sample to Blood Bank    Standing Status:   Standing    Number of Occurrences:   10    Next Expected Occurrence:    03/25/2024    Expiration Date:   03/21/2025      Avila Albritton L Marga Gramajo, PA-C 03/21/24  ADDENDUM: Hematology/Oncology Attending: I had a face-to-face encounter with the patient today.  I reviewed his record, lab, scan and recommended his care plan.  This is a very pleasant 73 years old white male with relapse extensive stage small cell lung cancer that was initially diagnosed in April 2021 and he is status post several treatment options in the past including systemic chemotherapy with carboplatin, etoposide and Imfinzi then maintenance Imfinzi then treatment again with carboplatin, etoposide and atezolizumab discontinued secondary to disease progression.  The patient with then treated with Lurbinectedin for 12 cycles discontinued secondary to disease progression.  He also received treatment with irinotecan as a single agent discontinued secondary to disease progression after 3 cycles he also received palliative radiotherapy to  metastatic disease in the liver.  He was then treated with another course of carboplatin and etoposide for 15 cycles discontinued secondary to disease progression. The patient then received treatment with Tarlatamab at [facility] for around 6 months discontinued secondary to disease progression he is currently undergoing treatment with topotecan 1.2 Mg/M2 on days 1-5 every 3 weeks with Cosela before treatment for Myeloprotection is status post 3 cycles.  He has been tolerating the treatment well except for the fatigue and chemotherapy-induced pancytopenia. He had repeat CT scan of the chest, abdomen and pelvis performed recently.  His scan showed stable disease in the lung but there is clear evidence for disease progression in one of the dominant liver lesion. I had a lengthy discussion with the patient and his wife about his current condition and treatment options.  I discussed with the patient the option of continuing his treatment with topotecan for now in addition to  referral to radiation oncology for consideration of radiotherapy to the progressive liver lesion but if the patient is not a candidate for radiation, may consider treatment with single agent paclitaxel or gemcitabine.  I also discussed with the patient the option of palliative care or hospice referral but he is still interested in treatment. He would like to proceed with topotecan for now and he will see radiation oncology but if not a candidate for treatment we will consider changing his chemotherapy regimen. The patient will come back for follow-up visit in 3 weeks for evaluation before the next cycle of his treatment. He was advised to call immediately if he has any other concerning symptoms in the interval. The total time spent in the appointment was 30 minutes. Disclaimer: This note was dictated with voice recognition software. Similar sounding words can inadvertently be transcribed and may be missed upon review. Lajuana Matte, MD

## 2024-03-17 NOTE — Progress Notes (Signed)
 Please attest and cosign this visit due to patients primary care provider not being in the office at the time the visit was completed.    Subjective:   Adam Wise is a 73 y.o. who presents for a Medicare Wellness preventive visit.  Visit Complete: Virtual I connected with  [name] on 03/17/24 by a audio enabled telemedicine application and verified that I am speaking with the correct person using two identifiers.  Patient Location: Home  Provider Location: Home Office  I discussed the limitations of evaluation and management by telemedicine. The patient expressed understanding and agreed to proceed.  Vital Signs: Because this visit was a virtual/telehealth visit, some criteria may be missing or patient reported. Any vitals not documented were not able to be obtained and vitals that have been documented are patient reported.  VideoDeclined- This patient declined Librarian, academic. Therefore the visit was completed with audio only.  Persons Participating in Visit: Patient.  AWV Questionnaire: No: Patient Medicare AWV questionnaire was not completed prior to this visit.  Cardiac Risk Factors include: advanced age (>13men, >76 women);diabetes mellitus;dyslipidemia;hypertension;male gender;obesity (BMI >30kg/m2)     Objective:    Today's Vitals   03/17/24 1130  Weight: 245 lb (111.1 kg)  Height: 5\' 9"  (1.753 m)  PainSc: 7    Body mass index is 36.18 kg/m.     03/17/2024   11:43 AM 01/04/2024    9:26 AM 08/10/2023   11:31 PM 08/10/2023    5:00 PM 05/19/2023   10:02 AM 05/19/2023    9:57 AM 03/09/2023   11:41 AM  Advanced Directives  Does Patient Have a Medical Advance Directive? Yes No No No No No Yes  Type of Estate agent of [place];Living will        Does patient want to make changes to medical advance directive?       No - Patient declined  Copy of Healthcare Power of Attorney in Chart? Yes - validated most recent copy  scanned in chart (See row information)        Would patient like information on creating a medical advance directive?   No - Patient declined  No - Patient declined No - Patient declined     Current Medications (verified) Outpatient Encounter Medications as of 03/17/2024  Medication Sig   albuterol (VENTOLIN HFA) 108 (90 Base) MCG/ACT inhaler INHALE 2 PUFFS INTO THE LUNGS EVERY 4 (FOUR) HOURS AS NEEDED FOR SHORTNESS OF BREATH   amLODipine (NORVASC) 10 MG tablet Take 0.5 tablets (5 mg total) by mouth daily. TAKE 1 TABLET BY MOUTH EVERY DAY for blood pressure.   B-D 3CC LUER-LOK SYR 22GX1" 22G X 1" 3 ML MISC USE AS INSTRUCTED FOR TESTOSTERONE INJECTION EVERY 2 WEEKS   Black Pepper-Turmeric (TURMERIC COMPLEX/BLACK PEPPER PO) Take 1 tablet by mouth in the morning and at bedtime.   Coenzyme Q10 (COQ10) 200 MG CAPS Take 200 mg by mouth at bedtime.   cyclobenzaprine (FLEXERIL) 5 MG tablet TAKE 1 TABLET BY MOUTH AT BEDTIME AS NEEDED FOR MUSCLE SPASMS.   docusate sodium (COLACE) 100 MG capsule Take 1 capsule (100 mg total) by mouth every 12 (twelve) hours.   ELIQUIS 5 MG TABS tablet TAKE 1 TABLET BY MOUTH TWICE A DAY   esomeprazole (NEXIUM) 20 MG capsule Take 20 mg by mouth in the morning.   gabapentin (NEURONTIN) 300 MG capsule TAKE 1 CAPSULE (300 MG TOTAL) BY MOUTH 2 (TWO) TIMES DAILY. FOR BACK PAIN.  glucose blood (ONETOUCH ULTRA) test strip USE UP TO 4 TIMES DAILY AS DIRECTED   Insulin Pen Needle (BD PEN NEEDLE NANO U/F) 32G X 4 MM MISC Use with insulin as prescribed Dx Code: E11.9   KRILL OIL PO Take 1 capsule by mouth daily.   Lancets MISC USE UP TO 3 TIMES DAILY AS DIRECTED   lidocaine (LIDODERM) 5 % Place 1 patch onto the skin daily. Remove & Discard patch within 12 hours or as directed by MD   losartan (COZAAR) 50 MG tablet TAKE 1 TABLET (50 MG TOTAL) BY MOUTH DAILY. FOR BLOOD PRESSURE.   Melatonin 10 MG CAPS Take 10 mg by mouth at bedtime as needed (for sleep).   metFORMIN (GLUCOPHAGE) 1000  MG tablet TAKE 1 TABLET (1,000 MG TOTAL) BY MOUTH 2 (TWO) TIMES DAILY WITH A MEAL. FOR DIABETES   mirtazapine (REMERON) 15 MG tablet TAKE 1 TABLET BY MOUTH EVERYDAY AT BEDTIME   morphine (MS CONTIN) 30 MG 12 hr tablet Take 1 tablet (30 mg total) by mouth 2 (two) times daily.   Multiple Vitamin (MULTI-VITAMINS) TABS Take 1 tablet by mouth daily with breakfast.   Naloxone HCl 3 MG/0.1ML LIQD Place 1 spray into both nostrils once. For known/suspected opiod overdose. Every 2-3 minutes in alternating nostril till EMS arrives.   nystatin (MYCOSTATIN) 100000 UNIT/ML suspension Take 5 mLs (500,000 Units total) by mouth 4 (four) times daily.   ondansetron (ZOFRAN) 4 MG tablet Take 1 tablet (4 mg total) by mouth every 8 (eight) hours as needed for nausea or vomiting.   oxyCODONE-acetaminophen (PERCOCET) 10-325 MG tablet Take 1 tablet by mouth every 4 (four) hours as needed.   pioglitazone (ACTOS) 45 MG tablet TAKE 1 TABLET (45 MG TOTAL) BY MOUTH DAILY. FOR DIABETES.   pravastatin (PRAVACHOL) 40 MG tablet TAKE 1 TABLET BY MOUTH EVERY DAY IN THE EVENING FOR CHOLESTEROL   PRESCRIPTION MEDICATION CPAP- At bedtime   testosterone cypionate (DEPOTESTOSTERONE CYPIONATE) 200 MG/ML injection Inject 200 mg into the muscle every 14 (fourteen) days.   ipratropium-albuterol (DUONEB) 0.5-2.5 (3) MG/3ML SOLN Take 3 mLs by nebulization every 4 (four) hours while awake for 3 days, THEN 3 mLs every 4 (four) hours as needed (shortness of breath or wheezing).   Facility-Administered Encounter Medications as of 03/17/2024  Medication   heparin lock flush 100 unit/mL   sodium chloride flush (NS) 0.9 % injection 10 mL    Allergies (verified) Bupropion and Hydrochlorothiazide   History: Past Medical History:  Diagnosis Date   Acute on chronic respiratory failure with hypoxia (HCC) 10/12/2021   Chickenpox    Chronic knee pain    Chronic low back pain    COPD exacerbation (HCC) 10/11/2021   COPD with exacerbation (HCC)  10/12/2021   Coronary artery calcification seen on CAT scan 11/2021   Coronary CTA 11/27/2021: Moderate to large right pleural effusion and compressive atelectasis right lung base. => Coronary Calcium Score 657.  Diffuse RCA calcification.  Minimal mild disease in the LAD and diagonal branches. == Overall limited study.  Notable artifact.   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 He has disease progression after the first line of chemotherapy in December 2021.   Testosterone deficiency    Type 2 diabetes mellitus (HCC)    Past Surgical History:  Procedure Laterality Date   CHEST TUBE INSERTION Right 01/20/2022   Procedure: CHEST TUBE INSERTION;  Surgeon: Lorin Glass, MD;  Location: [facility] ENDOSCOPY;  Service: Pulmonary;  Laterality: Right;  w/ Talc Pleurodesis, planned admit for Obs afterwards   CHEST TUBE INSERTION Right 04/28/2022   Procedure: INSERTION PLEURAL DRAINAGE CATHETER - Pigtail tail, drainage;  Surgeon: Lorin Glass, MD;  Location: [facility] ENDOSCOPY;  Service: Pulmonary;  Laterality: Right;  talc pleurodesis   CHEST TUBE INSERTION Right 09/01/2022   Procedure: INSERTION PLEURAL DRAINAGE CATHETER;  Surgeon: Lupita Leash, MD;  Location: [facility] ENDOSCOPY;  Service: Cardiopulmonary;  Laterality: Right;  pigtail catheter placement with talc pleurodesis   COLONOSCOPY WITH PROPOFOL N/A 12/17/2018   Procedure: COLONOSCOPY WITH PROPOFOL;  Surgeon: Pasty Spillers, MD;  Location: [facility] ENDOSCOPY;  Service: Gastroenterology;  Laterality: N/A;   IR IMAGING GUIDED PORT INSERTION  04/17/2020    IR THORACENTESIS ASP PLEURAL SPACE W/IMG GUIDE  11/29/2021   IR THORACENTESIS ASP PLEURAL SPACE W/IMG GUIDE  01/03/2022   JOINT REPLACEMENT Bilateral    REPLACEMENT TOTAL KNEE BILATERAL  2015   TALC PLEURODESIS  09/01/2022   Procedure: Lurlean Nanny;  Surgeon: Lupita Leash, MD;  Location: [facility] ENDOSCOPY;  Service: Cardiopulmonary;;   TONSILLECTOMY  1960   TRANSTHORACIC ECHOCARDIOGRAM  05/2020   a) 05/2020: EF 55 to 60%.  No R WMA.  Mild LVH.  Indeterminate LVEDP.  Unable to assess RVP.  Normal aortic and mitral valves.  Mildly elevated RAP.; b) 09/2021: EF 50-55%. No RWMA. Mild LVH. ~ LVEDP. Mild LA Dilation. NORMAL RV/RAP.  Normal MV/AoV.   Family History  Problem Relation Age of Onset   Heart attack Mother    Cancer Mother    Hypertension Mother    Arthritis Father    Asthma Father    Cancer Father    COPD Father    Heart attack Father    Hypertension Sister    Cancer Sister    Diabetes Sister    Asthma Sister    Cancer Sister    COPD Sister    Arthritis Sister    Asthma Sister    Diabetes Sister    Birth defects Maternal Grandfather    Arthritis Paternal Grandmother    Diabetes Paternal Grandmother    Arthritis Paternal Grandfather    Asthma Son    Other Neg Hx        pituitary disorder   Social History   Socioeconomic History   Marital status: Married    Spouse name: Not on file   Number of children: Not on file   Years of education: Not on file   Highest education level: 12th grade  Occupational History   Not on file  Tobacco Use   Smoking status: Former   Smokeless tobacco: Never  Vaping Use   Vaping status: Never Used  Substance and Sexual Activity   Alcohol use: Not Currently    Comment: rarely   Drug use: Not Currently   Sexual activity: Yes  Other Topics Concern   Not on file  Social History Narrative   Married.   Moved from [place].   Retired.   Social Drivers of Health   Financial Resource Strain: Low Risk  (03/17/2024)   Overall  Financial Resource Strain (CARDIA)  Difficulty of Paying Living Expenses: Not hard at all  Food Insecurity: No Food Insecurity (03/17/2024)   Hunger Vital Sign    Worried About Running Out of Food in the Last Year: Never true    Ran Out of Food in the Last Year: Never true  Transportation Needs: No Transportation Needs (03/17/2024)   PRAPARE - Administrator, Civil Service (Medical): No    Lack of Transportation (Non-Medical): No  Physical Activity: Inactive (03/17/2024)   Exercise Vital Sign    Days of Exercise per Week: 0 days    Minutes of Exercise per Session: 0 min  Stress: No Stress Concern Present (03/17/2024)   [employer] of Occupational Health - Occupational Stress Questionnaire    Feeling of Stress : Not at all  Social Connections: Moderately Integrated (03/17/2024)   Social Connection and Isolation Panel [NHANES]    Frequency of Communication with Friends and Family: Twice a week    Frequency of Social Gatherings with Friends and Family: Once a week    Attends Religious Services: 1 to 4 times per year    Active Member of [employer] or Organizations: No    Attends Engineer, structural: Never    Marital Status: Married    Tobacco Counseling Counseling given: Not Answered  Clinical Intake:  Pre-visit preparation completed: Yes  Pain : 0-10 Pain Score: 7  Pain Type: Chronic pain Pain Location: Back Pain Orientation: Lower Pain Descriptors / Indicators: Burning Pain Onset: More than a month ago Pain Frequency: Intermittent Pain Relieving Factors: heat, medications  Pain Relieving Factors: heat, medications  BMI - recorded: 36.18 Nutritional Status: BMI > 30  Obese Nutritional Risks: None Diabetes: Yes CBG done?: No Did pt. bring in CBG monitor from home?: No  Lab Results  Component Value Date   HGBA1C 6.2 (A) 01/28/2024   HGBA1C 6.3 (A) 07/28/2023   HGBA1C 6.5 (A) 03/26/2023     How often do you need to have someone help you when you  read instructions, pamphlets, or other written materials from your doctor or pharmacy?: 1 - Never  Interpreter Needed?: No  Comments: lives with wife Information entered by :: B.Arnoldo Hildreth,LPN   Activities of Daily Living     03/17/2024   11:44 AM 03/02/2024   10:07 AM  In your present state of health, do you have any difficulty performing the following activities:  Hearing? 0 0  Vision? 1 1  Difficulty concentrating or making decisions? 0 0  Walking or climbing stairs? 1 1  Comment has stair lift   Dressing or bathing? 0 0  Doing errands, shopping? 0 0  Preparing Food and eating ? N N  Using the Toilet? N N  In the past six months, have you accidently leaked urine? N N  Do you have problems with loss of bowel control? N N  Managing your Medications? N N  Managing your Finances? N N  Housekeeping or managing your Housekeeping? N N    Patient Care Team: Heilingoetter, Dossie Arbour as PCP - General (Physician Assistant) Marykay Lex, MD as PCP - Cardiology (Cardiology) Syliva Overman, RN as Oncology Nurse Navigator Pickenpack-Cousar, Arty Baumgartner, NP as Nurse Practitioner [facility] and Palliative Medicine)  Indicate any recent Medical Services you may have received from other than [facility] providers in the past year (date may be approximate).     Assessment:   This is a routine wellness examination for Jerrelle.  Hearing/Vision screen Hearing Screening - Comments:: Pt  says his hearing is good Vision Screening - Comments:: Pt says his vision is good w/glasses [facility]   Goals Addressed             This Visit's Progress    care coordination activities - no follow up required.   On track    Interventions Today    Flowsheet Row Most Recent Value  General Interventions   General Interventions Discussed/Reviewed General Interventions Discussed  [Care coordination services discussed and offered. SDOH assessment completed.  patient advised to contact his primary care  provider if care coordination services needed in the future.]           Increase physical activity   Not on track    03/17/24-I will attempt to exercise for 60 minutes 3 days per week.      Patient Stated   On track    03/17/24-, I will maintain and continue medications as prescribed.     Patient Stated   On track    03/17/24-Would like to maintain current routine      Patient Stated   On track    03/17/24-Be able to mow grass.       Depression Screen     03/17/2024   11:38 AM 07/28/2023   11:28 AM 02/25/2023    2:14 PM 02/14/2022    2:56 PM 08/13/2021   10:41 AM 02/13/2021    3:27 PM 01/28/2021   10:44 AM  PHQ 2/9 Scores  PHQ - 2 Score 0 1 0 0 0 0 0  PHQ- 9 Score  5   0 0 2    Fall Risk     03/17/2024   11:33 AM 03/02/2024   10:07 AM 01/28/2024   11:56 AM 07/28/2023   11:28 AM 03/26/2023   11:03 AM  Fall Risk   Falls in the past year? 1 1 1  0 0  Number falls in past yr: 1 0 0 0 0  Injury with Fall? 0 0 1 0 0  Risk for fall due to : No Fall Risks  History of fall(s)  No Fall Risks  Follow up Education provided;Falls prevention discussed  Falls evaluation completed Falls evaluation completed Falls evaluation completed    MEDICARE RISK AT HOME:  Medicare Risk at Home Any stairs in or around the home?: Yes If so, are there any without handrails?: Yes Home free of loose throw rugs in walkways, pet beds, electrical cords, etc?: Yes Adequate lighting in your home to reduce risk of falls?: Yes Life alert?: No Use of a cane, walker or w/c?: Yes (cane and walker) Grab bars in the bathroom?: Yes Shower chair or bench in shower?: Yes Elevated toilet seat or a handicapped toilet?: Yes  TIMED UP AND GO:  Was the test performed?  No  Cognitive Function: 6CIT completed    02/13/2021    3:29 PM 11/01/2019   12:08 PM 10/27/2018   11:14 AM  MMSE - Mini Mental State Exam  Not completed: Refused    Orientation to time  5 5  Orientation to Place  5 5  Registration  3 3  Attention/  Calculation  5 0  Recall  3 3  Language- name 2 objects   0  Language- repeat  1 1  Language- follow 3 step command   3  Language- read & follow direction   0  Write a sentence   0  Copy design   0  Total score   20  03/17/2024   11:45 AM 02/25/2023    2:16 PM  6CIT Screen  What Year? 0 points 0 points  What month? 0 points 0 points  What time? 0 points 0 points  Count back from 20 0 points 0 points  Months in reverse 0 points 0 points  Repeat phrase 0 points 0 points  Total Score 0 points 0 points    Immunizations Immunization History  Administered Date(s) Administered   Fluad Quad(high Dose 65+) 10/26/2020, 08/13/2021, 10/30/2022   Influenza Inj Mdck Quad Pf 09/18/2017   Influenza,inj,Quad PF,6+ Mos 10/27/2018, 11/08/2019   Influenza-Unspecified 09/18/2017   PFIZER(Purple Top)SARS-COV-2 Vaccination 03/12/2020, 04/02/2020, 12/11/2020   Pneumococcal Conjugate-13 08/17/2015   Pneumococcal Polysaccharide-23 11/25/2012, 10/27/2018   Td 08/27/2016    Screening Tests Health Maintenance  Topic Date Due   Diabetic kidney evaluation - Urine ACR  Never done   OPHTHALMOLOGY EXAM  11/15/2019   INFLUENZA VACCINE  07/15/2024   HEMOGLOBIN A1C  07/27/2024   FOOT EXAM  01/27/2025   Diabetic kidney evaluation - eGFR measurement  03/14/2025   DTaP/Tdap/Td (2 - Tdap) 08/27/2026   Pneumonia Vaccine 92+ Years old  Completed   Hepatitis C Screening  Completed   HPV VACCINES  Aged Out   Colonoscopy  Discontinued   COVID-19 Vaccine  Discontinued   Zoster Vaccines- Shingrix  Discontinued    Health Maintenance  Health Maintenance Due  Topic Date Due   Diabetic kidney evaluation - Urine ACR  Never done   OPHTHALMOLOGY EXAM  11/15/2019   Health Maintenance Items Addressed: None needed  Additional Screening:  Vision Screening: Recommended annual ophthalmology exams for early detection of glaucoma and other disorders of the eye.  Dental Screening: Recommended annual dental  exams for proper oral hygiene  Community Resource Referral / Chronic Care Management: CRR required this visit?  No   CCM required this visit?  No    Plan:     I have personally reviewed and noted the following in the patient's chart:   Medical and social history Use of alcohol, tobacco or illicit drugs  Current medications and supplements including opioid prescriptions. Patient is currently taking opioid prescriptions. Information provided to patient regarding non-opioid alternatives. Patient advised to discuss non-opioid treatment plan with their provider. Functional ability and status Nutritional status Physical activity Advanced directives List of other physicians Hospitalizations, surgeries, and ER visits in previous 12 months Vitals Screenings to include cognitive, depression, and falls Referrals and appointments  In addition, I have reviewed and discussed with patient certain preventive protocols, quality metrics, and best practice recommendations. A written personalized care plan for preventive services as well as general preventive health recommendations were provided to patient.    Sue Lush, LPN   01/15/3085   After Visit Summary: (MyChart) Due to this being a telephonic visit, the after visit summary with patients personalized plan was offered to patient via MyChart   Notes: Nothing significant to report at this time.

## 2024-03-17 NOTE — Patient Instructions (Signed)
 Adam Wise , Thank you for taking time to come for your Medicare Wellness Visit. I appreciate your ongoing commitment to your health goals. Please review the following plan we discussed and let me know if I can assist you in the future.   Referrals/Orders/Follow-Ups/Clinician Recommendations: none  This is a list of the screening recommended for you and due dates:  Health Maintenance  Topic Date Due   Yearly kidney health urinalysis for diabetes  Never done   Eye exam for diabetics  11/15/2019   Flu Shot  07/15/2024   Hemoglobin A1C  07/27/2024   Complete foot exam   01/27/2025   Yearly kidney function blood test for diabetes  03/14/2025   DTaP/Tdap/Td vaccine (2 - Tdap) 08/27/2026   Pneumonia Vaccine  Completed   Hepatitis C Screening  Completed   HPV Vaccine  Aged Out   Colon Cancer Screening  Discontinued   COVID-19 Vaccine  Discontinued   Zoster (Shingles) Vaccine  Discontinued    Advanced directives: (In Chart) A copy of your advanced directives are scanned into your chart should your provider ever need it.  Next Medicare Annual Wellness Visit scheduled for next year: Yes 03/20/25 @ 11:30am televisit

## 2024-03-18 ENCOUNTER — Ambulatory Visit: Payer: Medicare Other

## 2024-03-21 ENCOUNTER — Inpatient Hospital Stay (HOSPITAL_BASED_OUTPATIENT_CLINIC_OR_DEPARTMENT_OTHER): Payer: Medicare Other | Admitting: Physician Assistant

## 2024-03-21 ENCOUNTER — Inpatient Hospital Stay: Payer: Medicare Other | Attending: Radiation Oncology

## 2024-03-21 ENCOUNTER — Other Ambulatory Visit: Payer: Medicare Other

## 2024-03-21 ENCOUNTER — Inpatient Hospital Stay

## 2024-03-21 ENCOUNTER — Other Ambulatory Visit

## 2024-03-21 VITALS — HR 96

## 2024-03-21 VITALS — BP 114/75 | HR 101 | Temp 97.6°F | Resp 17 | Wt 250.3 lb

## 2024-03-21 DIAGNOSIS — C7951 Secondary malignant neoplasm of bone: Secondary | ICD-10-CM | POA: Insufficient documentation

## 2024-03-21 DIAGNOSIS — Z5111 Encounter for antineoplastic chemotherapy: Secondary | ICD-10-CM | POA: Diagnosis not present

## 2024-03-21 DIAGNOSIS — K769 Liver disease, unspecified: Secondary | ICD-10-CM

## 2024-03-21 DIAGNOSIS — R251 Tremor, unspecified: Secondary | ICD-10-CM | POA: Diagnosis not present

## 2024-03-21 DIAGNOSIS — D61810 Antineoplastic chemotherapy induced pancytopenia: Secondary | ICD-10-CM | POA: Insufficient documentation

## 2024-03-21 DIAGNOSIS — G893 Neoplasm related pain (acute) (chronic): Secondary | ICD-10-CM | POA: Insufficient documentation

## 2024-03-21 DIAGNOSIS — C3411 Malignant neoplasm of upper lobe, right bronchus or lung: Secondary | ICD-10-CM | POA: Insufficient documentation

## 2024-03-21 DIAGNOSIS — J8410 Pulmonary fibrosis, unspecified: Secondary | ICD-10-CM | POA: Insufficient documentation

## 2024-03-21 DIAGNOSIS — J90 Pleural effusion, not elsewhere classified: Secondary | ICD-10-CM | POA: Diagnosis not present

## 2024-03-21 DIAGNOSIS — Z515 Encounter for palliative care: Secondary | ICD-10-CM | POA: Insufficient documentation

## 2024-03-21 DIAGNOSIS — Z95828 Presence of other vascular implants and grafts: Secondary | ICD-10-CM

## 2024-03-21 LAB — CMP (CANCER CENTER ONLY)
ALT: 10 U/L (ref 0–44)
AST: 17 U/L (ref 15–41)
Albumin: 3.4 g/dL — ABNORMAL LOW (ref 3.5–5.0)
Alkaline Phosphatase: 79 U/L (ref 38–126)
Anion gap: 6 (ref 5–15)
BUN: 15 mg/dL (ref 8–23)
CO2: 29 mmol/L (ref 22–32)
Calcium: 8.2 mg/dL — ABNORMAL LOW (ref 8.9–10.3)
Chloride: 105 mmol/L (ref 98–111)
Creatinine: 1.07 mg/dL (ref 0.61–1.24)
GFR, Estimated: >60 mL/min (ref >60)
Glucose, Bld: 158 mg/dL — ABNORMAL HIGH (ref 70–99)
Potassium: 4.2 mmol/L (ref 3.5–5.1)
Sodium: 140 mmol/L (ref 135–145)
Total Bilirubin: 0.4 mg/dL (ref 0.0–1.2)
Total Protein: 5.6 g/dL — ABNORMAL LOW (ref 6.5–8.1)

## 2024-03-21 LAB — CBC WITH DIFFERENTIAL (CANCER CENTER ONLY)
Abs Immature Granulocytes: 0.02 10*3/uL (ref 0.00–0.07)
Basophils Absolute: 0 10*3/uL (ref 0.0–0.1)
Basophils Relative: 1 %
Eosinophils Absolute: 0 10*3/uL (ref 0.0–0.5)
Eosinophils Relative: 1 %
HCT: 28.9 % — ABNORMAL LOW (ref 39.0–52.0)
Hemoglobin: 8.8 g/dL — ABNORMAL LOW (ref 13.0–17.0)
Immature Granulocytes: 1 %
Lymphocytes Relative: 11 %
Lymphs Abs: 0.4 10*3/uL — ABNORMAL LOW (ref 0.7–4.0)
MCH: 29.9 pg (ref 26.0–34.0)
MCHC: 30.4 g/dL (ref 30.0–36.0)
MCV: 98.3 fL (ref 80.0–100.0)
Monocytes Absolute: 0.4 10*3/uL (ref 0.1–1.0)
Monocytes Relative: 12 %
Neutro Abs: 2.4 10*3/uL (ref 1.7–7.7)
Neutrophils Relative %: 74 %
Platelet Count: 246 10*3/uL (ref 150–400)
RBC: 2.94 MIL/uL — ABNORMAL LOW (ref 4.22–5.81)
RDW: 22.4 % — ABNORMAL HIGH (ref 11.5–15.5)
WBC Count: 3.3 10*3/uL — ABNORMAL LOW (ref 4.0–10.5)
nRBC: 0 % (ref 0.0–0.2)

## 2024-03-21 LAB — SAMPLE TO BLOOD BANK

## 2024-03-21 MED ORDER — PROCHLORPERAZINE MALEATE 10 MG PO TABS
10.0000 mg | ORAL_TABLET | Freq: Once | ORAL | Status: AC
  Administered 2024-03-21: 10 mg via ORAL
  Filled 2024-03-21: qty 1

## 2024-03-21 MED ORDER — SODIUM CHLORIDE 0.9 % IV SOLN: INTRAVENOUS | Status: DC

## 2024-03-21 MED ORDER — HEPARIN SOD (PORK) LOCK FLUSH 100 UNIT/ML IV SOLN
500.0000 [IU] | Freq: Once | INTRAVENOUS | Status: AC | PRN
  Administered 2024-03-21: 500 [IU]

## 2024-03-21 MED ORDER — TRILACICLIB DIHYDROCHLORIDE INJECTION 300 MG
240.0000 mg/m2 | Freq: Once | INTRAVENOUS | Status: AC
  Administered 2024-03-21: 600 mg via INTRAVENOUS
  Filled 2024-03-21: qty 40

## 2024-03-21 MED ORDER — SODIUM CHLORIDE 0.9% FLUSH
10.0000 mL | Freq: Once | INTRAVENOUS | Status: AC
Start: 2024-03-21 — End: 2024-03-21
  Administered 2024-03-21: 10 mL

## 2024-03-21 MED ORDER — SODIUM CHLORIDE 0.9% FLUSH
10.0000 mL | INTRAVENOUS | Status: DC | PRN
Start: 2024-03-21 — End: 2024-03-21
  Administered 2024-03-21: 10 mL

## 2024-03-21 MED ORDER — TOPOTECAN HCL CHEMO INJECTION 4 MG
1.0000 mg/m2 | Freq: Once | INTRAVENOUS | Status: AC
  Administered 2024-03-21: 2.4 mg via INTRAVENOUS
  Filled 2024-03-21: qty 2.4

## 2024-03-21 NOTE — Patient Instructions (Signed)

## 2024-03-21 NOTE — Patient Instructions (Signed)
 [facility]  Discharge Instructions: Thank you for choosing [facility] to provide your oncology and hematology care.   If you have a lab appointment with the Cancer Center, please go directly to the Cancer Center and check in at the registration area.   Wear comfortable clothing and clothing appropriate for easy access to any Portacath or PICC line.   We strive to give you quality time with your provider. You may need to reschedule your appointment if you arrive late (15 or more minutes).  Arriving late affects you and other patients whose appointments are after yours.  Also, if you miss three or more appointments without notifying the office, you may be dismissed from the clinic at the provider's discretion.      For prescription refill requests, have your pharmacy contact our office and allow 72 hours for refills to be completed.    Today you received the following chemotherapy and/or immunotherapy agents cosela, topotecan      To help prevent nausea and vomiting after your treatment, we encourage you to take your nausea medication as directed.  BELOW ARE SYMPTOMS THAT SHOULD BE REPORTED IMMEDIATELY: *FEVER GREATER THAN 100.4 F (38 C) OR HIGHER *CHILLS OR SWEATING *NAUSEA AND VOMITING THAT IS NOT CONTROLLED WITH YOUR NAUSEA MEDICATION *UNUSUAL SHORTNESS OF BREATH *UNUSUAL BRUISING OR BLEEDING *URINARY PROBLEMS (pain or burning when urinating, or frequent urination) *BOWEL PROBLEMS (unusual diarrhea, constipation, pain near the anus) TENDERNESS IN MOUTH AND THROAT WITH OR WITHOUT PRESENCE OF ULCERS (sore throat, sores in mouth, or a toothache) UNUSUAL RASH, SWELLING OR PAIN  UNUSUAL VAGINAL DISCHARGE OR ITCHING   Items with * indicate a potential emergency and should be followed up as soon as possible or go to the Emergency Department if any problems should occur.  Please show the CHEMOTHERAPY ALERT CARD or  IMMUNOTHERAPY ALERT CARD at check-in to the Emergency Department and triage nurse.  Should you have questions after your visit or need to cancel or reschedule your appointment, please contact [facility]  Dept: [phone]  and follow the prompts.  Office hours are 8:00 a.m. to 4:30 p.m. Monday - Friday. Please note that voicemails left after 4:00 p.m. may not be returned until the following business day.  We are closed weekends and major holidays. You have access to a nurse at all times for urgent questions. Please call the main number to the clinic Dept: [phone] and follow the prompts.   For any non-urgent questions, you may also contact your provider using MyChart. We now offer e-Visits for anyone 40 and older to request care online for non-urgent symptoms. For details visit [email or web address].   Also download the MyChart app! Go to the app store, search "MyChart", open the app, select [place], and log in with your MyChart username and password.

## 2024-03-22 ENCOUNTER — Inpatient Hospital Stay: Payer: Medicare Other

## 2024-03-22 VITALS — BP 99/65 | HR 100 | Temp 98.4°F | Resp 18

## 2024-03-22 DIAGNOSIS — Z515 Encounter for palliative care: Secondary | ICD-10-CM | POA: Diagnosis not present

## 2024-03-22 DIAGNOSIS — G893 Neoplasm related pain (acute) (chronic): Secondary | ICD-10-CM | POA: Diagnosis not present

## 2024-03-22 DIAGNOSIS — J90 Pleural effusion, not elsewhere classified: Secondary | ICD-10-CM | POA: Diagnosis not present

## 2024-03-22 DIAGNOSIS — C3411 Malignant neoplasm of upper lobe, right bronchus or lung: Secondary | ICD-10-CM | POA: Diagnosis not present

## 2024-03-22 DIAGNOSIS — C7951 Secondary malignant neoplasm of bone: Secondary | ICD-10-CM | POA: Diagnosis not present

## 2024-03-22 DIAGNOSIS — Z5111 Encounter for antineoplastic chemotherapy: Secondary | ICD-10-CM | POA: Diagnosis not present

## 2024-03-22 MED ORDER — HEPARIN SOD (PORK) LOCK FLUSH 100 UNIT/ML IV SOLN
500.0000 [IU] | Freq: Once | INTRAVENOUS | Status: AC | PRN
  Administered 2024-03-22: 500 [IU]

## 2024-03-22 MED ORDER — SODIUM CHLORIDE 0.9% FLUSH
10.0000 mL | INTRAVENOUS | Status: DC | PRN
  Administered 2024-03-22: 10 mL

## 2024-03-22 MED ORDER — PROCHLORPERAZINE MALEATE 10 MG PO TABS
10.0000 mg | ORAL_TABLET | Freq: Once | ORAL | Status: AC
  Administered 2024-03-22: 10 mg via ORAL
  Filled 2024-03-22: qty 1

## 2024-03-22 MED ORDER — TRILACICLIB DIHYDROCHLORIDE INJECTION 300 MG
240.0000 mg/m2 | Freq: Once | INTRAVENOUS | Status: AC
  Administered 2024-03-22: 600 mg via INTRAVENOUS
  Filled 2024-03-22: qty 40

## 2024-03-22 MED ORDER — SODIUM CHLORIDE 0.9 % IV SOLN: INTRAVENOUS | Status: DC

## 2024-03-22 MED ORDER — TOPOTECAN HCL CHEMO INJECTION 4 MG
1.0000 mg/m2 | Freq: Once | INTRAVENOUS | Status: AC
  Administered 2024-03-22: 2.4 mg via INTRAVENOUS
  Filled 2024-03-22: qty 2.4

## 2024-03-22 NOTE — Patient Instructions (Signed)
 [facility]  Discharge Instructions: Thank you for choosing [facility] to provide your oncology and hematology care.   If you have a lab appointment with the Cancer Center, please go directly to the Cancer Center and check in at the registration area.   Wear comfortable clothing and clothing appropriate for easy access to any Portacath or PICC line.   We strive to give you quality time with your provider. You may need to reschedule your appointment if you arrive late (15 or more minutes).  Arriving late affects you and other patients whose appointments are after yours.  Also, if you miss three or more appointments without notifying the office, you may be dismissed from the clinic at the provider's discretion.      For prescription refill requests, have your pharmacy contact our office and allow 72 hours for refills to be completed.    Today you received the following chemotherapy and/or immunotherapy agents cosela, topotecan      To help prevent nausea and vomiting after your treatment, we encourage you to take your nausea medication as directed.  BELOW ARE SYMPTOMS THAT SHOULD BE REPORTED IMMEDIATELY: *FEVER GREATER THAN 100.4 F (38 C) OR HIGHER *CHILLS OR SWEATING *NAUSEA AND VOMITING THAT IS NOT CONTROLLED WITH YOUR NAUSEA MEDICATION *UNUSUAL SHORTNESS OF BREATH *UNUSUAL BRUISING OR BLEEDING *URINARY PROBLEMS (pain or burning when urinating, or frequent urination) *BOWEL PROBLEMS (unusual diarrhea, constipation, pain near the anus) TENDERNESS IN MOUTH AND THROAT WITH OR WITHOUT PRESENCE OF ULCERS (sore throat, sores in mouth, or a toothache) UNUSUAL RASH, SWELLING OR PAIN  UNUSUAL VAGINAL DISCHARGE OR ITCHING   Items with * indicate a potential emergency and should be followed up as soon as possible or go to the Emergency Department if any problems should occur.  Please show the CHEMOTHERAPY ALERT CARD or  IMMUNOTHERAPY ALERT CARD at check-in to the Emergency Department and triage nurse.  Should you have questions after your visit or need to cancel or reschedule your appointment, please contact [facility]  Dept: [phone]  and follow the prompts.  Office hours are 8:00 a.m. to 4:30 p.m. Monday - Friday. Please note that voicemails left after 4:00 p.m. may not be returned until the following business day.  We are closed weekends and major holidays. You have access to a nurse at all times for urgent questions. Please call the main number to the clinic Dept: [phone] and follow the prompts.   For any non-urgent questions, you may also contact your provider using MyChart. We now offer e-Visits for anyone 40 and older to request care online for non-urgent symptoms. For details visit [email or web address].   Also download the MyChart app! Go to the app store, search "MyChart", open the app, select [place], and log in with your MyChart username and password.

## 2024-03-23 ENCOUNTER — Inpatient Hospital Stay: Payer: Medicare Other

## 2024-03-23 VITALS — BP 130/72 | HR 100 | Temp 98.0°F | Resp 16

## 2024-03-23 DIAGNOSIS — J90 Pleural effusion, not elsewhere classified: Secondary | ICD-10-CM | POA: Diagnosis not present

## 2024-03-23 DIAGNOSIS — C3411 Malignant neoplasm of upper lobe, right bronchus or lung: Secondary | ICD-10-CM

## 2024-03-23 DIAGNOSIS — C7951 Secondary malignant neoplasm of bone: Secondary | ICD-10-CM | POA: Diagnosis not present

## 2024-03-23 DIAGNOSIS — G893 Neoplasm related pain (acute) (chronic): Secondary | ICD-10-CM | POA: Diagnosis not present

## 2024-03-23 DIAGNOSIS — Z5111 Encounter for antineoplastic chemotherapy: Secondary | ICD-10-CM | POA: Diagnosis not present

## 2024-03-23 DIAGNOSIS — Z515 Encounter for palliative care: Secondary | ICD-10-CM | POA: Diagnosis not present

## 2024-03-23 MED ORDER — TOPOTECAN HCL CHEMO INJECTION 4 MG
1.0000 mg/m2 | Freq: Once | INTRAVENOUS | Status: AC
  Administered 2024-03-23: 2.4 mg via INTRAVENOUS
  Filled 2024-03-23: qty 2.4

## 2024-03-23 MED ORDER — HEPARIN SOD (PORK) LOCK FLUSH 100 UNIT/ML IV SOLN
500.0000 [IU] | Freq: Once | INTRAVENOUS | Status: AC | PRN
Start: 2024-03-23 — End: 2024-03-23
  Administered 2024-03-23: 500 [IU]

## 2024-03-23 MED ORDER — SODIUM CHLORIDE 0.9 % IV SOLN
INTRAVENOUS | Status: DC
Start: 2024-03-23 — End: 2024-03-23

## 2024-03-23 MED ORDER — PROCHLORPERAZINE MALEATE 10 MG PO TABS
10.0000 mg | ORAL_TABLET | Freq: Once | ORAL | Status: AC
  Administered 2024-03-23: 10 mg via ORAL
  Filled 2024-03-23: qty 1

## 2024-03-23 MED ORDER — SODIUM CHLORIDE 0.9% FLUSH
10.0000 mL | INTRAVENOUS | Status: DC | PRN
Start: 2024-03-23 — End: 2024-03-23
  Administered 2024-03-23: 10 mL

## 2024-03-23 MED ORDER — TRILACICLIB DIHYDROCHLORIDE INJECTION 300 MG
240.0000 mg/m2 | Freq: Once | INTRAVENOUS | Status: AC
  Administered 2024-03-23: 600 mg via INTRAVENOUS
  Filled 2024-03-23: qty 40

## 2024-03-23 NOTE — Patient Instructions (Signed)
 [name]  Discharge Instructions: Thank you for choosing [facility] to provide your oncology and hematology care.   If you have a lab appointment with the Cancer Center, please go directly to the Cancer Center and check in at the registration area.   Wear comfortable clothing and clothing appropriate for easy access to any Portacath or PICC line.   We strive to give you quality time with your provider. You may need to reschedule your appointment if you arrive late (15 or more minutes).  Arriving late affects you and other patients whose appointments are after yours.  Also, if you miss three or more appointments without notifying the office, you may be dismissed from the clinic at the provider's discretion.      For prescription refill requests, have your pharmacy contact our office and allow 72 hours for refills to be completed.    Today you received the following chemotherapy and/or immunotherapy agents: Cosela, Topotecan.       To help prevent nausea and vomiting after your treatment, we encourage you to take your nausea medication as directed.  BELOW ARE SYMPTOMS THAT SHOULD BE REPORTED IMMEDIATELY: *FEVER GREATER THAN 100.4 F (38 C) OR HIGHER *CHILLS OR SWEATING *NAUSEA AND VOMITING THAT IS NOT CONTROLLED WITH YOUR NAUSEA MEDICATION *UNUSUAL SHORTNESS OF BREATH *UNUSUAL BRUISING OR BLEEDING *URINARY PROBLEMS (pain or burning when urinating, or frequent urination) *BOWEL PROBLEMS (unusual diarrhea, constipation, pain near the anus) TENDERNESS IN MOUTH AND THROAT WITH OR WITHOUT PRESENCE OF ULCERS (sore throat, sores in mouth, or a toothache) UNUSUAL RASH, SWELLING OR PAIN  UNUSUAL VAGINAL DISCHARGE OR ITCHING   Items with * indicate a potential emergency and should be followed up as soon as possible or go to the Emergency Department if any problems should occur.  Please show the CHEMOTHERAPY ALERT CARD or  IMMUNOTHERAPY ALERT CARD at check-in to the Emergency Department and triage nurse.  Should you have questions after your visit or need to cancel or reschedule your appointment, please contact [facility]  Dept: [phone]  and follow the prompts.  Office hours are 8:00 a.m. to 4:30 p.m. Monday - Friday. Please note that voicemails left after 4:00 p.m. may not be returned until the following business day.  We are closed weekends and major holidays. You have access to a nurse at all times for urgent questions. Please call the main number to the clinic Dept: [phone] and follow the prompts.   For any non-urgent questions, you may also contact your provider using MyChart. We now offer e-Visits for anyone 16 and older to request care online for non-urgent symptoms. For details visit [email or web address].   Also download the MyChart app! Go to the app store, search "MyChart", open the app, select [place], and log in with your MyChart username and password.

## 2024-03-24 ENCOUNTER — Inpatient Hospital Stay: Payer: Medicare Other

## 2024-03-24 VITALS — BP 107/74 | HR 99 | Temp 98.8°F | Resp 18

## 2024-03-24 DIAGNOSIS — G893 Neoplasm related pain (acute) (chronic): Secondary | ICD-10-CM | POA: Diagnosis not present

## 2024-03-24 DIAGNOSIS — J90 Pleural effusion, not elsewhere classified: Secondary | ICD-10-CM | POA: Diagnosis not present

## 2024-03-24 DIAGNOSIS — C7951 Secondary malignant neoplasm of bone: Secondary | ICD-10-CM | POA: Diagnosis not present

## 2024-03-24 DIAGNOSIS — C3411 Malignant neoplasm of upper lobe, right bronchus or lung: Secondary | ICD-10-CM | POA: Diagnosis not present

## 2024-03-24 DIAGNOSIS — Z79899 Other long term (current) drug therapy: Secondary | ICD-10-CM | POA: Diagnosis not present

## 2024-03-24 DIAGNOSIS — E236 Other disorders of pituitary gland: Secondary | ICD-10-CM | POA: Diagnosis not present

## 2024-03-24 DIAGNOSIS — Z515 Encounter for palliative care: Secondary | ICD-10-CM | POA: Diagnosis not present

## 2024-03-24 DIAGNOSIS — Z5111 Encounter for antineoplastic chemotherapy: Secondary | ICD-10-CM | POA: Diagnosis not present

## 2024-03-24 DIAGNOSIS — R7989 Other specified abnormal findings of blood chemistry: Secondary | ICD-10-CM | POA: Diagnosis not present

## 2024-03-24 MED ORDER — HEPARIN SOD (PORK) LOCK FLUSH 100 UNIT/ML IV SOLN
500.0000 [IU] | Freq: Once | INTRAVENOUS | Status: AC | PRN
  Administered 2024-03-24: 500 [IU]

## 2024-03-24 MED ORDER — SODIUM CHLORIDE 0.9% FLUSH
10.0000 mL | INTRAVENOUS | Status: DC | PRN
  Administered 2024-03-24: 10 mL

## 2024-03-24 MED ORDER — SODIUM CHLORIDE 0.9 % IV SOLN: INTRAVENOUS | Status: DC

## 2024-03-24 MED ORDER — TRILACICLIB DIHYDROCHLORIDE INJECTION 300 MG
240.0000 mg/m2 | Freq: Once | INTRAVENOUS | Status: AC
  Administered 2024-03-24: 600 mg via INTRAVENOUS
  Filled 2024-03-24: qty 40

## 2024-03-24 MED ORDER — PROCHLORPERAZINE MALEATE 10 MG PO TABS
10.0000 mg | ORAL_TABLET | Freq: Once | ORAL | Status: AC
  Administered 2024-03-24: 10 mg via ORAL
  Filled 2024-03-24: qty 1

## 2024-03-24 MED ORDER — TOPOTECAN HCL CHEMO INJECTION 4 MG
1.0000 mg/m2 | Freq: Once | INTRAVENOUS | Status: AC
  Administered 2024-03-24: 2.4 mg via INTRAVENOUS
  Filled 2024-03-24: qty 2.4

## 2024-03-24 NOTE — Patient Instructions (Signed)
 [facility]  Discharge Instructions: Thank you for choosing [facility] to provide your oncology and hematology care.   If you have a lab appointment with the Cancer Center, please go directly to the Cancer Center and check in at the registration area.   Wear comfortable clothing and clothing appropriate for easy access to any Portacath or PICC line.   We strive to give you quality time with your provider. You may need to reschedule your appointment if you arrive late (15 or more minutes).  Arriving late affects you and other patients whose appointments are after yours.  Also, if you miss three or more appointments without notifying the office, you may be dismissed from the clinic at the provider's discretion.      For prescription refill requests, have your pharmacy contact our office and allow 72 hours for refills to be completed.    Today you received the following chemotherapy and/or immunotherapy agents cosela and topotecan       To help prevent nausea and vomiting after your treatment, we encourage you to take your nausea medication as directed.  BELOW ARE SYMPTOMS THAT SHOULD BE REPORTED IMMEDIATELY: *FEVER GREATER THAN 100.4 F (38 C) OR HIGHER *CHILLS OR SWEATING *NAUSEA AND VOMITING THAT IS NOT CONTROLLED WITH YOUR NAUSEA MEDICATION *UNUSUAL SHORTNESS OF BREATH *UNUSUAL BRUISING OR BLEEDING *URINARY PROBLEMS (pain or burning when urinating, or frequent urination) *BOWEL PROBLEMS (unusual diarrhea, constipation, pain near the anus) TENDERNESS IN MOUTH AND THROAT WITH OR WITHOUT PRESENCE OF ULCERS (sore throat, sores in mouth, or a toothache) UNUSUAL RASH, SWELLING OR PAIN  UNUSUAL VAGINAL DISCHARGE OR ITCHING   Items with * indicate a potential emergency and should be followed up as soon as possible or go to the Emergency Department if any problems should occur.  Please show the CHEMOTHERAPY ALERT CARD or  IMMUNOTHERAPY ALERT CARD at check-in to the Emergency Department and triage nurse.  Should you have questions after your visit or need to cancel or reschedule your appointment, please contact [facility]  Dept: [phone]  and follow the prompts.  Office hours are 8:00 a.m. to 4:30 p.m. Monday - Friday. Please note that voicemails left after 4:00 p.m. may not be returned until the following business day.  We are closed weekends and major holidays. You have access to a nurse at all times for urgent questions. Please call the main number to the clinic Dept: [phone] and follow the prompts.   For any non-urgent questions, you may also contact your provider using MyChart. We now offer e-Visits for anyone 53 and older to request care online for non-urgent symptoms. For details visit [email or web address].   Also download the MyChart app! Go to the app store, search "MyChart", open the app, select [place], and log in with your MyChart username and password.

## 2024-03-25 ENCOUNTER — Inpatient Hospital Stay: Payer: Medicare Other

## 2024-03-25 ENCOUNTER — Other Ambulatory Visit: Payer: Self-pay | Admitting: Nurse Practitioner

## 2024-03-25 VITALS — BP 132/69 | HR 100 | Temp 98.4°F | Resp 16

## 2024-03-25 DIAGNOSIS — C3411 Malignant neoplasm of upper lobe, right bronchus or lung: Secondary | ICD-10-CM

## 2024-03-25 DIAGNOSIS — G893 Neoplasm related pain (acute) (chronic): Secondary | ICD-10-CM

## 2024-03-25 DIAGNOSIS — C7951 Secondary malignant neoplasm of bone: Secondary | ICD-10-CM | POA: Diagnosis not present

## 2024-03-25 DIAGNOSIS — Z5111 Encounter for antineoplastic chemotherapy: Secondary | ICD-10-CM | POA: Diagnosis not present

## 2024-03-25 DIAGNOSIS — Z515 Encounter for palliative care: Secondary | ICD-10-CM | POA: Diagnosis not present

## 2024-03-25 DIAGNOSIS — J90 Pleural effusion, not elsewhere classified: Secondary | ICD-10-CM | POA: Diagnosis not present

## 2024-03-25 MED ORDER — MORPHINE SULFATE ER 30 MG PO TBCR
30.0000 mg | EXTENDED_RELEASE_TABLET | Freq: Two times a day (BID) | ORAL | 0 refills | Status: DC
Start: 2024-03-25 — End: 2024-04-26

## 2024-03-25 MED ORDER — HEPARIN SOD (PORK) LOCK FLUSH 100 UNIT/ML IV SOLN
500.0000 [IU] | Freq: Once | INTRAVENOUS | Status: AC | PRN
  Administered 2024-03-25: 500 [IU]

## 2024-03-25 MED ORDER — PROCHLORPERAZINE MALEATE 10 MG PO TABS
10.0000 mg | ORAL_TABLET | Freq: Once | ORAL | Status: AC
Start: 2024-03-25 — End: 2024-03-25
  Administered 2024-03-25: 10 mg via ORAL
  Filled 2024-03-25: qty 1

## 2024-03-25 MED ORDER — SODIUM CHLORIDE 0.9% FLUSH
10.0000 mL | INTRAVENOUS | Status: DC | PRN
  Administered 2024-03-25: 10 mL

## 2024-03-25 MED ORDER — TRILACICLIB DIHYDROCHLORIDE INJECTION 300 MG
240.0000 mg/m2 | Freq: Once | INTRAVENOUS | Status: AC
  Administered 2024-03-25: 600 mg via INTRAVENOUS
  Filled 2024-03-25: qty 40

## 2024-03-25 MED ORDER — TOPOTECAN HCL CHEMO INJECTION 4 MG
1.0000 mg/m2 | Freq: Once | INTRAVENOUS | Status: AC
  Administered 2024-03-25: 2.4 mg via INTRAVENOUS
  Filled 2024-03-25: qty 2.4

## 2024-03-25 MED ORDER — SODIUM CHLORIDE 0.9 % IV SOLN
INTRAVENOUS | Status: DC
Start: 2024-03-25 — End: 2024-03-25

## 2024-03-25 MED ORDER — OXYCODONE-ACETAMINOPHEN 10-325 MG PO TABS
1.0000 | ORAL_TABLET | ORAL | 0 refills | Status: AC | PRN
Start: 2024-03-25 — End: ?

## 2024-03-25 NOTE — Patient Instructions (Addendum)
 [facility]  Discharge Instructions: Thank you for choosing [facility] to provide your oncology and hematology care.   If you have a lab appointment with the Cancer Center, please go directly to the Cancer Center and check in at the registration area.   Wear comfortable clothing and clothing appropriate for easy access to any Portacath or PICC line.   We strive to give you quality time with your provider. You may need to reschedule your appointment if you arrive late (15 or more minutes).  Arriving late affects you and other patients whose appointments are after yours.  Also, if you miss three or more appointments without notifying the office, you may be dismissed from the clinic at the provider's discretion.      For prescription refill requests, have your pharmacy contact our office and allow 72 hours for refills to be completed.    Today you received the following chemotherapy and/or immunotherapy agents: topotecan (HYCAMTIN) and trilaciclib dihydrochloride     To help prevent nausea and vomiting after your treatment, we encourage you to take your nausea medication as directed.  BELOW ARE SYMPTOMS THAT SHOULD BE REPORTED IMMEDIATELY: *FEVER GREATER THAN 100.4 F (38 C) OR HIGHER *CHILLS OR SWEATING *NAUSEA AND VOMITING THAT IS NOT CONTROLLED WITH YOUR NAUSEA MEDICATION *UNUSUAL SHORTNESS OF BREATH *UNUSUAL BRUISING OR BLEEDING *URINARY PROBLEMS (pain or burning when urinating, or frequent urination) *BOWEL PROBLEMS (unusual diarrhea, constipation, pain near the anus) TENDERNESS IN MOUTH AND THROAT WITH OR WITHOUT PRESENCE OF ULCERS (sore throat, sores in mouth, or a toothache) UNUSUAL RASH, SWELLING OR PAIN  UNUSUAL VAGINAL DISCHARGE OR ITCHING   Items with * indicate a potential emergency and should be followed up as soon as possible or go to the Emergency Department if any problems should occur.  Please show the  CHEMOTHERAPY ALERT CARD or IMMUNOTHERAPY ALERT CARD at check-in to the Emergency Department and triage nurse.  Should you have questions after your visit or need to cancel or reschedule your appointment, please contact [facility]  Dept: [phone]  and follow the prompts.  Office hours are 8:00 a.m. to 4:30 p.m. Monday - Friday. Please note that voicemails left after 4:00 p.m. may not be returned until the following business day.  We are closed weekends and major holidays. You have access to a nurse at all times for urgent questions. Please call the main number to the clinic Dept: [phone] and follow the prompts.   For any non-urgent questions, you may also contact your provider using MyChart. We now offer e-Visits for anyone 1 and older to request care online for non-urgent symptoms. For details visit [email or web address].   Also download the MyChart app! Go to the app store, search "MyChart", open the app, select [place], and log in with your MyChart username and password.  \[id number]\

## 2024-03-28 ENCOUNTER — Encounter: Payer: Self-pay | Admitting: Radiation Oncology

## 2024-03-28 ENCOUNTER — Inpatient Hospital Stay

## 2024-03-28 ENCOUNTER — Inpatient Hospital Stay: Payer: Medicare Other

## 2024-03-28 DIAGNOSIS — J90 Pleural effusion, not elsewhere classified: Secondary | ICD-10-CM | POA: Diagnosis not present

## 2024-03-28 DIAGNOSIS — C3411 Malignant neoplasm of upper lobe, right bronchus or lung: Secondary | ICD-10-CM

## 2024-03-28 DIAGNOSIS — G893 Neoplasm related pain (acute) (chronic): Secondary | ICD-10-CM | POA: Diagnosis not present

## 2024-03-28 DIAGNOSIS — Z515 Encounter for palliative care: Secondary | ICD-10-CM | POA: Diagnosis not present

## 2024-03-28 DIAGNOSIS — Z5111 Encounter for antineoplastic chemotherapy: Secondary | ICD-10-CM | POA: Diagnosis not present

## 2024-03-28 DIAGNOSIS — C7951 Secondary malignant neoplasm of bone: Secondary | ICD-10-CM | POA: Diagnosis not present

## 2024-03-28 LAB — CBC WITH DIFFERENTIAL (CANCER CENTER ONLY)
Abs Immature Granulocytes: 0.01 10*3/uL (ref 0.00–0.07)
Basophils Absolute: 0 10*3/uL (ref 0.0–0.1)
Basophils Relative: 2 %
Eosinophils Absolute: 0 10*3/uL (ref 0.0–0.5)
Eosinophils Relative: 2 %
HCT: 25.9 % — ABNORMAL LOW (ref 39.0–52.0)
Hemoglobin: 8.1 g/dL — ABNORMAL LOW (ref 13.0–17.0)
Immature Granulocytes: 1 %
Lymphocytes Relative: 20 %
Lymphs Abs: 0.4 10*3/uL — ABNORMAL LOW (ref 0.7–4.0)
MCH: 30.5 pg (ref 26.0–34.0)
MCHC: 31.3 g/dL (ref 30.0–36.0)
MCV: 97.4 fL (ref 80.0–100.0)
Monocytes Absolute: 0.1 10*3/uL (ref 0.1–1.0)
Monocytes Relative: 3 %
Neutro Abs: 1.4 10*3/uL — ABNORMAL LOW (ref 1.7–7.7)
Neutrophils Relative %: 72 %
Platelet Count: 157 10*3/uL (ref 150–400)
RBC: 2.66 MIL/uL — ABNORMAL LOW (ref 4.22–5.81)
RDW: 19.9 % — ABNORMAL HIGH (ref 11.5–15.5)
WBC Count: 1.9 10*3/uL — ABNORMAL LOW (ref 4.0–10.5)
nRBC: 0 % (ref 0.0–0.2)

## 2024-03-28 LAB — CMP (CANCER CENTER ONLY)
ALT: 9 U/L (ref 0–44)
AST: 17 U/L (ref 15–41)
Albumin: 3.4 g/dL — ABNORMAL LOW (ref 3.5–5.0)
Alkaline Phosphatase: 75 U/L (ref 38–126)
Anion gap: 6 (ref 5–15)
BUN: 16 mg/dL (ref 8–23)
CO2: 28 mmol/L (ref 22–32)
Calcium: 8.5 mg/dL — ABNORMAL LOW (ref 8.9–10.3)
Chloride: 105 mmol/L (ref 98–111)
Creatinine: 1.17 mg/dL (ref 0.61–1.24)
GFR, Estimated: >60 mL/min (ref >60)
Glucose, Bld: 149 mg/dL — ABNORMAL HIGH (ref 70–99)
Potassium: 4.9 mmol/L (ref 3.5–5.1)
Sodium: 139 mmol/L (ref 135–145)
Total Bilirubin: 0.4 mg/dL (ref 0.0–1.2)
Total Protein: 5.7 g/dL — ABNORMAL LOW (ref 6.5–8.1)

## 2024-03-28 LAB — SAMPLE TO BLOOD BANK

## 2024-03-29 ENCOUNTER — Other Ambulatory Visit: Payer: Self-pay | Admitting: Physician Assistant

## 2024-03-30 ENCOUNTER — Ambulatory Visit: Payer: PRIVATE HEALTH INSURANCE

## 2024-03-30 ENCOUNTER — Ambulatory Visit: Payer: PRIVATE HEALTH INSURANCE | Admitting: Radiation Oncology

## 2024-04-04 ENCOUNTER — Inpatient Hospital Stay

## 2024-04-04 ENCOUNTER — Inpatient Hospital Stay: Payer: Medicare Other

## 2024-04-04 DIAGNOSIS — C3411 Malignant neoplasm of upper lobe, right bronchus or lung: Secondary | ICD-10-CM | POA: Diagnosis not present

## 2024-04-04 DIAGNOSIS — Z515 Encounter for palliative care: Secondary | ICD-10-CM | POA: Diagnosis not present

## 2024-04-04 DIAGNOSIS — J90 Pleural effusion, not elsewhere classified: Secondary | ICD-10-CM | POA: Diagnosis not present

## 2024-04-04 DIAGNOSIS — Z5111 Encounter for antineoplastic chemotherapy: Secondary | ICD-10-CM | POA: Diagnosis not present

## 2024-04-04 DIAGNOSIS — C7951 Secondary malignant neoplasm of bone: Secondary | ICD-10-CM | POA: Diagnosis not present

## 2024-04-04 DIAGNOSIS — G893 Neoplasm related pain (acute) (chronic): Secondary | ICD-10-CM | POA: Diagnosis not present

## 2024-04-04 LAB — CMP (CANCER CENTER ONLY)
ALT: 13 U/L (ref 0–44)
AST: 19 U/L (ref 15–41)
Albumin: 3.6 g/dL (ref 3.5–5.0)
Alkaline Phosphatase: 83 U/L (ref 38–126)
Anion gap: 7 (ref 5–15)
BUN: 13 mg/dL (ref 8–23)
CO2: 29 mmol/L (ref 22–32)
Calcium: 8.6 mg/dL — ABNORMAL LOW (ref 8.9–10.3)
Chloride: 105 mmol/L (ref 98–111)
Creatinine: 1.11 mg/dL (ref 0.61–1.24)
GFR, Estimated: >60 mL/min (ref >60)
Glucose, Bld: 164 mg/dL — ABNORMAL HIGH (ref 70–99)
Potassium: 4.2 mmol/L (ref 3.5–5.1)
Sodium: 141 mmol/L (ref 135–145)
Total Bilirubin: 0.3 mg/dL (ref 0.0–1.2)
Total Protein: 5.8 g/dL — ABNORMAL LOW (ref 6.5–8.1)

## 2024-04-04 LAB — CBC WITH DIFFERENTIAL (CANCER CENTER ONLY)
Abs Immature Granulocytes: 0 10*3/uL (ref 0.00–0.07)
Basophils Absolute: 0 10*3/uL (ref 0.0–0.1)
Basophils Relative: 1 %
Eosinophils Absolute: 0 10*3/uL (ref 0.0–0.5)
Eosinophils Relative: 2 %
HCT: 25.3 % — ABNORMAL LOW (ref 39.0–52.0)
Hemoglobin: 8.1 g/dL — ABNORMAL LOW (ref 13.0–17.0)
Immature Granulocytes: 0 %
Lymphocytes Relative: 23 %
Lymphs Abs: 0.4 10*3/uL — ABNORMAL LOW (ref 0.7–4.0)
MCH: 31.5 pg (ref 26.0–34.0)
MCHC: 32 g/dL (ref 30.0–36.0)
MCV: 98.4 fL (ref 80.0–100.0)
Monocytes Absolute: 0.3 10*3/uL (ref 0.1–1.0)
Monocytes Relative: 17 %
Neutro Abs: 1 10*3/uL — ABNORMAL LOW (ref 1.7–7.7)
Neutrophils Relative %: 57 %
Platelet Count: 52 10*3/uL — ABNORMAL LOW (ref 150–400)
RBC: 2.57 MIL/uL — ABNORMAL LOW (ref 4.22–5.81)
RDW: 20.4 % — ABNORMAL HIGH (ref 11.5–15.5)
WBC Count: 1.8 10*3/uL — ABNORMAL LOW (ref 4.0–10.5)
nRBC: 0 % (ref 0.0–0.2)

## 2024-04-04 LAB — SAMPLE TO BLOOD BANK

## 2024-04-05 ENCOUNTER — Other Ambulatory Visit: Payer: Self-pay | Admitting: Nurse Practitioner

## 2024-04-05 ENCOUNTER — Other Ambulatory Visit: Payer: Self-pay

## 2024-04-05 NOTE — Progress Notes (Signed)
 Error

## 2024-04-05 NOTE — Progress Notes (Incomplete)
 Nursing interview for a diagnosis of Relapsed extensive stage (T4, N3, M1c)  small cell lung cancer diagnosed in April 2021 and presented with large right upper lobe/right hilar mass with ipsilateral and contralateral mediastinal and right supraclavicular lymphadenopathy in addition to 'multiple liver lesions'. He was initially diagnosed in December 2021.   Patient identity verified x2.  Intent: Palliative Location: Liver/ ABD  Patient states "Occasionally mild constipation (improving/ under control). Miralax  helps."  -Pain: Yes- ABD 2/10 RUQ -ABD issues: Occasional ABD pain (as mentioned above) -Chest issues: Denies -Lung issues: SOB, managed with Albuterol  and C-PAP QHS -Cardiac issues: Denies -Appetite: Reduced, but is taking Remeron   15 mg p.o. at bedtime PRN to help with this.  -Weight: Significant weight loss of 60 pounds, with a recent loss of 4-5 pounds over the last few weeks.   All pertinent body systems reviewed w/ patient. Patient denies any other related issues at this time.  Meaningful use complete.  Vitals limited via  phone- Ht 5\' 9"  (1.753 m)   Wt 250 lb (113.4 kg)   BMI 36.92 kg/m   This concludes the interaction.  Additional complaints / other details:   -CT chest/ABD Impression 03/18/2024: Radiation changes in the right hemithorax. No evidence of thoracic metastatic disease. 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 Associated upper abdominal nodal metastases, mildly progressive.  -Completed palliative radiation to the right iliac bone lesion in February 2025. First Treatment Date: 2024-01-04/ Last Treatment Date: 2024-01-22 (See Dr. Bing Buff note 02/08/2024 for a full 'Prior therapy' list.)  -Oncologist- Dr. Marlene Simas    Avery Bodo, LPN

## 2024-04-06 ENCOUNTER — Ambulatory Visit
Admission: RE | Admit: 2024-04-06 | Discharge: 2024-04-06 | Disposition: A | Discharge status: Home / Self Care | Payer: PRIVATE HEALTH INSURANCE | Source: Ambulatory Visit | Attending: Radiation Oncology | Admitting: Radiation Oncology

## 2024-04-06 ENCOUNTER — Ambulatory Visit
Admission: RE | Admit: 2024-04-06 | Discharge: 2024-04-06 | Discharge status: Home / Self Care | Payer: PRIVATE HEALTH INSURANCE | Source: Ambulatory Visit | Attending: Radiation Oncology | Admitting: Radiation Oncology

## 2024-04-06 ENCOUNTER — Encounter: Payer: Self-pay | Admitting: Radiation Oncology

## 2024-04-06 VITALS — Ht 69.0 in | Wt 250.0 lb

## 2024-04-06 DIAGNOSIS — C3411 Malignant neoplasm of upper lobe, right bronchus or lung: Secondary | ICD-10-CM

## 2024-04-06 DIAGNOSIS — C787 Secondary malignant neoplasm of liver and intrahepatic bile duct: Secondary | ICD-10-CM

## 2024-04-06 DIAGNOSIS — C229 Malignant neoplasm of liver, not specified as primary or secondary: Secondary | ICD-10-CM

## 2024-04-06 DIAGNOSIS — M545 Low back pain, unspecified: Secondary | ICD-10-CM

## 2024-04-06 DIAGNOSIS — Z87891 Personal history of nicotine dependence: Secondary | ICD-10-CM | POA: Diagnosis not present

## 2024-04-06 NOTE — Progress Notes (Signed)
 Radiation Oncology         [phone] ________________________________  Outpatient Re-Consultation - Conducted via telephone at patient request.  I spoke with the patient to conduct this consult visit via telephone. The patient was notified in advance and was offered an in person or telemedicine meeting to allow for face to face communication but instead preferred to proceed with a telephone consult.      Name: Adam Wise        MRN: [id number]  Date of Service: 04/06/2024 DOB: 1951/02/11  WU:[id number], Leita Purdue, PA-C  Marlene Simas, MD     REFERRING PHYSICIAN: Marlene Simas, MD   DIAGNOSIS: The primary encounter diagnosis was Malignant neoplasm of liver, unspecified liver malignancy type (HCC). 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   HISTORY OF PRESENT ILLNESS: Adam Wise is a 73 y.o. male seen at the request of Dr. Marguerita Shih who was diagnosed with extensive small cell lung cancer arising in the right upper lobe in April 2021.  He received a palliative course of radiation involving the right upper lobe overlapping into the right middle lobe. He developed progression in 2022 including progressive liver disease. He received stereotactic body radiotherapy (SBRT) in February 2024.    He has been heavily pretreated systemically; in December 2024, the patient had been experiencing increasing pain in the right hip.  Imaging at [facility] on 12/21/2023 with plain x-ray films showed no acute fracture, moderate hip joint space narrowing, subchondral sclerosis and osteophytes, pubic symphysis left hip and SI joints were approximated, mild left hip joint osteoarthritis and degenerative disc disease in the lower lumbar spine were noted, no focal findings were described as being concerning for malignancy in the lumbar spine MRI on 12/13/2023 but he does have severe  spinal canal stenosis at L3-4 with right neural foraminal narrowing and thickening of the right exiting nerve root.  There was also description of focal enhancement on that imaging test of the right iliac bone.  Because of this he was offered palliative radiation which he began and completed 9 of 15 planned treatments between January and early February 2025.  He wanted to discontinue treatment early due to tiredness.  He started systemic topotecan  on 01/11/2024.  He is due for his next infusion on Monday, April 12, 2023.  The next cycle following this would be May 03, 2023.  His most recent CT of the chest abdomen pelvis for restaging on 03/08/2024 showed progression in a right hepatic metastasis in comparison measuring 5.2 cm in August 2024, now measuring 7.8 cm in greatest dimension, an additional 16 mm lesion inferior with adjacent fiducial markers and radiation changes were again noted and a caudate lesion measured 18 mm previously 2.2 cm.  Upper abdominal nodal disease was also mildly progressive measuring 2.3 cm at the portacaval node previously 2.1 cm.  Given these concerns he was referred back to discuss the possibility of more radiotherapy to the liver, and he is contacted by phone to discuss this.    PREVIOUS RADIATION THERAPY:   01/04/24-01/22/24 Plan Name: Pelvis_R Site: [place], Right Technique: Isodose Plan Mode: Photon Dose Per Fraction: 2.5 Gy Prescribed Dose (Delivered / Prescribed): 22.5 Gy / 37.5 Gy Prescribed Fxs (Delivered / Prescribed): 9 / 15    01/14/2023 through 01/27/2023 SBRT Treatment Site Technique Total Dose (Gy) Dose per Fx (Gy) Completed Fx Beam Energies  Liver: Liver Three targets  in one isocenter IMRT 60/60 12 5/5 6XFFF    04/12/20-05/02/20:  The right lung target overlapping the RUL and RML was treated to 37.5 Gy in 15 fractions   PAST MEDICAL HISTORY:  Past Medical History:  Diagnosis Date   Acute on chronic respiratory failure with hypoxia (HCC) 10/12/2021    Chickenpox    Chronic knee pain    Chronic low back pain    COPD exacerbation (HCC) 10/11/2021   COPD with exacerbation (HCC) 10/12/2021   Coronary artery calcification seen on CAT scan 11/2021   Coronary CTA 11/27/2021: Moderate to large right pleural effusion and compressive atelectasis right lung base. => Coronary Calcium Score 657.  Diffuse RCA calcification.  Minimal mild disease in the LAD and diagonal branches. == Overall limited study.  Notable artifact.   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 He has disease progression after the first line of chemotherapy in December 2021.   Testosterone  deficiency    Type 2 diabetes mellitus (HCC)        PAST SURGICAL HISTORY: Past Surgical History:  Procedure Laterality Date   CHEST TUBE INSERTION Right 01/20/2022   Procedure: CHEST TUBE INSERTION;  Surgeon: Josiah Nigh, MD;  Location: [facility] ENDOSCOPY;  Service: Pulmonary;  Laterality: Right;  w/ Talc  Pleurodesis, planned admit for Obs afterwards   CHEST TUBE INSERTION Right 04/28/2022   Procedure: INSERTION PLEURAL DRAINAGE CATHETER - Pigtail tail, drainage;  Surgeon: Josiah Nigh, MD;  Location: [facility] ENDOSCOPY;  Service: Pulmonary;  Laterality: Right;  talc  pleurodesis   CHEST TUBE INSERTION Right 09/01/2022   Procedure: INSERTION PLEURAL DRAINAGE CATHETER;  Surgeon: Marine Sia, MD;  Location: [facility] ENDOSCOPY;  Service: Cardiopulmonary;  Laterality: Right;  pigtail catheter placement with talc  pleurodesis   COLONOSCOPY WITH PROPOFOL  N/A 12/17/2018   Procedure: COLONOSCOPY WITH  PROPOFOL ;  Surgeon: Irby Mannan, MD;  Location: [facility] ENDOSCOPY;  Service: Gastroenterology;  Laterality: N/A;   IR IMAGING GUIDED PORT INSERTION  04/17/2020   IR THORACENTESIS ASP PLEURAL SPACE W/IMG GUIDE  11/29/2021   IR THORACENTESIS ASP PLEURAL SPACE W/IMG GUIDE  01/03/2022   JOINT REPLACEMENT Bilateral    REPLACEMENT TOTAL KNEE BILATERAL  2015   TALC  PLEURODESIS  09/01/2022   Procedure: TALC  PLEURADESIS;  Surgeon: Marine Sia, MD;  Location: [facility] ENDOSCOPY;  Service: Cardiopulmonary;;   TONSILLECTOMY  1960   TRANSTHORACIC ECHOCARDIOGRAM  05/2020   a) 05/2020: EF 55 to 60%.  No R WMA.  Mild LVH.  Indeterminate LVEDP.  Unable to assess RVP.  Normal aortic and mitral valves.  Mildly elevated RAP.; b) 09/2021: EF 50-55%. No RWMA. Mild LVH. ~ LVEDP. Mild LA Dilation. NORMAL RV/RAP.  Normal MV/AoV.     FAMILY HISTORY:  Family History  Problem Relation Age of Onset   Heart attack Mother    Cancer Mother    Hypertension Mother    Arthritis Father    Asthma Father    Cancer Father    COPD Father    Heart attack Father    Hypertension Sister    Cancer Sister    Diabetes Sister  Asthma Sister    Cancer Sister    COPD Sister    Arthritis Sister    Asthma Sister    Diabetes Sister    Birth defects Maternal Grandfather    Arthritis Paternal Grandmother    Diabetes Paternal Grandmother    Arthritis Paternal Grandfather    Asthma Son    Other Neg Hx        pituitary disorder     SOCIAL HISTORY:  reports that he has quit smoking. He has never used smokeless tobacco. He reports that he does not currently use alcohol. He reports that he does not currently use drugs.  The patient is married and lives in [place] .   ALLERGIES: Bupropion and Hydrochlorothiazide   MEDICATIONS:  Current Outpatient Medications  Medication Sig Dispense Refill   albuterol  (VENTOLIN  HFA) 108 (90 Base) MCG/ACT inhaler INHALE 2 PUFFS INTO THE LUNGS EVERY 4 (FOUR) HOURS AS NEEDED  FOR SHORTNESS OF BREATH 18 each 0   amLODipine  (NORVASC ) 10 MG tablet Take 0.5 tablets (5 mg total) by mouth daily. TAKE 1 TABLET BY MOUTH EVERY DAY for blood pressure.     B-D 3CC LUER-LOK SYR 22GX1" 22G X 1" 3 ML MISC USE AS INSTRUCTED FOR TESTOSTERONE  INJECTION EVERY 2 WEEKS 10 each 2   Black Pepper-Turmeric (TURMERIC COMPLEX/BLACK PEPPER PO) Take 1 tablet by mouth in the morning and at bedtime.     Coenzyme Q10 (COQ10) 200 MG CAPS Take 200 mg by mouth at bedtime.     cyclobenzaprine  (FLEXERIL ) 5 MG tablet TAKE 1 TABLET BY MOUTH AT BEDTIME AS NEEDED FOR MUSCLE SPASMS. 90 tablet 0   docusate sodium  (COLACE) 100 MG capsule Take 1 capsule (100 mg total) by mouth every 12 (twelve) hours. 60 capsule 0   ELIQUIS  5 MG TABS tablet TAKE 1 TABLET BY MOUTH TWICE A DAY 180 tablet 2   esomeprazole (NEXIUM) 20 MG capsule Take 20 mg by mouth in the morning.     gabapentin  (NEURONTIN ) 300 MG capsule TAKE 1 CAPSULE (300 MG TOTAL) BY MOUTH 2 (TWO) TIMES DAILY. FOR BACK PAIN. 180 capsule 1   glucose blood (ONETOUCH ULTRA) test strip USE UP TO 4 TIMES DAILY AS DIRECTED 400 strip 5   Insulin  Pen Needle (BD PEN NEEDLE NANO U/F) 32G X 4 MM MISC Use with insulin  as prescribed Dx Code: E11.9 100 each 3   KRILL OIL PO Take 1 capsule by mouth daily.     Lancets MISC USE UP TO 3 TIMES DAILY AS DIRECTED 100 each 2   lidocaine  (LIDODERM ) 5 % Place 1 patch onto the skin daily. Remove & Discard patch within 12 hours or as directed by MD 30 patch 0   losartan  (COZAAR ) 50 MG tablet TAKE 1 TABLET (50 MG TOTAL) BY MOUTH DAILY. FOR BLOOD PRESSURE. 90 tablet 3   Melatonin 10 MG CAPS Take 10 mg by mouth at bedtime as needed (for sleep).     metFORMIN  (GLUCOPHAGE ) 1000 MG tablet TAKE 1 TABLET (1,000 MG TOTAL) BY MOUTH 2 (TWO) TIMES DAILY WITH A MEAL. FOR DIABETES 180 tablet 1   mirtazapine  (REMERON ) 15 MG tablet TAKE 1 TABLET BY MOUTH EVERYDAY AT BEDTIME 90 tablet 1   morphine  (MS CONTIN ) 30 MG 12 hr tablet Take 1 tablet (30 mg  total) by mouth 2 (two) times daily. 60 tablet 0   Multiple Vitamin (MULTI-VITAMINS) TABS Take 1 tablet by mouth daily with breakfast.     Naloxone  HCl 3 MG/0.1ML LIQD  Place 1 spray into both nostrils once. For known/suspected opiod overdose. Every 2-3 minutes in alternating nostril till EMS arrives.     nystatin  (MYCOSTATIN ) 100000 UNIT/ML suspension Take 5 mLs (500,000 Units total) by mouth 4 (four) times daily. 60 mL 0   ondansetron  (ZOFRAN ) 4 MG tablet Take 1 tablet (4 mg total) by mouth every 8 (eight) hours as needed for nausea or vomiting. 30 tablet 0   oxyCODONE -acetaminophen  (PERCOCET) 10-325 MG tablet Take 1 tablet by mouth every 4 (four) hours as needed. 60 tablet 0   pioglitazone  (ACTOS ) 45 MG tablet TAKE 1 TABLET (45 MG TOTAL) BY MOUTH DAILY. FOR DIABETES. 90 tablet 1   polyethylene glycol powder (GLYCOLAX /MIRALAX ) 17 GM/SCOOP powder Take by mouth once.     pravastatin  (PRAVACHOL ) 40 MG tablet TAKE 1 TABLET BY MOUTH EVERY DAY IN THE EVENING FOR CHOLESTEROL 90 tablet 3   PRESCRIPTION MEDICATION CPAP- At bedtime     testosterone  cypionate (DEPOTESTOSTERONE CYPIONATE) 200 MG/ML injection Inject 200 mg into the muscle every 14 (fourteen) days.     ipratropium-albuterol  (DUONEB) 0.5-2.5 (3) MG/3ML SOLN Take 3 mLs by nebulization every 4 (four) hours while awake for 3 days, THEN 3 mLs every 4 (four) hours as needed (shortness of breath or wheezing). 360 mL 0   No current facility-administered medications for this encounter.   Facility-Administered Medications Ordered in Other Encounters  Medication Dose Route Frequency Provider Last Rate Last Admin   heparin  lock flush 100 unit/mL  500 Units Intracatheter Once Marlene Simas, MD       sodium chloride  flush (NS) 0.9 % injection 10 mL  10 mL Intracatheter Once Marlene Simas, MD         REVIEW OF SYSTEMS: On review of systems the patient reports that his right hip/pelvis continues to be somewhat uncomfortable, he feels however that  those was still significantly improved compared to prior to his recent radiation treatment earlier this year.  He also continues to have chronic low back pain which limits his comfort and mobility.  He acknowledges some mid abdominal discomfort, stable shortness of breath with exertion but not progressive.  He is tired often due to his chemotherapy infusions especially he feels day 4 after the infusions is the most fatiguing.  No other complaints are verbalized  PHYSICAL EXAM:  Unable to assess given encounter type   ECOG = 1  0 - Asymptomatic (Fully active, able to carry on all predisease activities without restriction)  1 - Symptomatic but completely ambulatory (Restricted in physically strenuous activity but ambulatory and able to carry out work of a light or sedentary nature. For example, light housework, office work)  2 - Symptomatic, <50% in bed during the day (Ambulatory and capable of all self care but unable to carry out any work activities. Up and about more than 50% of waking hours)  3 - Symptomatic, >50% in bed, but not bedbound (Capable of only limited self-care, confined to bed or chair 50% or more of waking hours)  4 - Bedbound (Completely disabled. Cannot carry on any self-care. Totally confined to bed or chair)  5 - Death   Aurea Blossom MM, Creech RH, Tormey DC, et al. [phone]). "Toxicity and response criteria of the [facility]". Am. Hillard Lowes. Oncol. 5 (6): 649-55    LABORATORY DATA:  Lab Results  Component Value Date   WBC 1.8 (L) 04/04/2024   HGB 8.1 (L) 04/04/2024   HCT 25.3 (L) 04/04/2024   MCV 98.4 04/04/2024  PLT 52 (L) 04/04/2024   Lab Results  Component Value Date   NA 141 04/04/2024   K 4.2 04/04/2024   CL 105 04/04/2024   CO2 29 04/04/2024   Lab Results  Component Value Date   ALT 13 04/04/2024   AST 19 04/04/2024   ALKPHOS 83 04/04/2024   BILITOT 0.3 04/04/2024      RADIOGRAPHY: CT CHEST ABDOMEN PELVIS W CONTRAST Result Date:  03/18/2024 CLINICAL DATA:  Follow-up small cell lung cancer EXAM: CT CHEST, ABDOMEN, AND PELVIS WITH CONTRAST TECHNIQUE: Multidetector CT imaging of the chest, abdomen and pelvis was performed following the standard protocol during bolus administration of intravenous contrast. RADIATION DOSE REDUCTION: This exam was performed according to the departmental dose-optimization program which includes automated exposure control, adjustment of the mA and/or kV according to patient size and/or use of iterative reconstruction technique. CONTRAST:  OMNIPAQUE  IOHEXOL  300 MG/ML  SOLN COMPARISON:  CT abdomen/pelvis dated 08/02/2023. CT chest dated 05/19/2023. FINDINGS: CT CHEST FINDINGS Cardiovascular: The heart is normal in size. Trace pericardial fluid. No evidence of thoracic aortic aneurysm. Atherosclerotic calcifications of the aortic arch. Moderate three-vessel coronary atherosclerosis. Right chest port terminates in the upper right atrium. Mediastinum/Nodes: No suspicious mediastinal lymphadenopathy. Visualized thyroid  is unremarkable. Lungs/Pleura: Radiation changes with volume loss in the right hemithorax. No suspicious pulmonary nodules. No focal consolidation. Right pleural calcifications, likely reflecting prior talc  pleurodesis. No pneumothorax. Musculoskeletal: Degenerative changes of the thoracic spine. CT ABDOMEN PELVIS FINDINGS Hepatobiliary: 7.8 x 5.7 cm right hepatic metastasis (series 2/image 45), previously 5.2 x 4.3 cm. Additional 16 mm lesion inferiorly in segment 5 (series 2/image 56), with adjacent fiducial markers and surrounding radiation changes, previously 19 mm. Additional 18 mm lesion in the caudate, previously 2.2 cm. Gallbladder is unremarkable. No intrahepatic or extrahepatic ductal dilatation. Pancreas: Within normal limits. Spleen: Within normal limits. Adrenals/Urinary Tract: 17 mm right adrenal nodule (series 2/image 85), previously characterized as a benign adrenal adenoma. Left  adrenal gland is within normal limits. Bilateral renal cysts, including a dominant 7.7 cm cyst in the right lower kidney (series 2/image 69), benign (Bosniak I). No follow-up is recommended. Thick-walled bladder, although underdistended. Stomach/Bowel: Stomach is within normal limits. No evidence of bowel obstruction.  Moderate duodenal diverticulum. Normal appendix (series 2/image 82). No colonic wall thickening or inflammatory changes. Vascular/Lymphatic: No evidence of abdominal aortic aneurysm. Atherosclerotic calcifications of the abdominal aorta and branch vessels, although vessels remain patent. Upper abdominal lymphadenopathy, including a dominant 2.3 cm short axis portacaval node (series 2/image 62), previously 2.1 cm. Reproductive: Mild enlargement of the central gland indenting the base the bladder, suggesting BPH. Other: No abdominopelvic ascites. No frank peritoneal nodularity/omental caking. Musculoskeletal: Mild degenerative changes of the lumbar spine. IMPRESSION: Radiation changes in the right hemithorax. No evidence of thoracic metastatic disease. 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 Associated upper abdominal nodal metastases, mildly progressive. Additional ancillary findings as above. Electronically Signed   By: Zadie Herter M.D.   On: 03/18/2024 02:02        IMPRESSION/PLAN: 1. Progressive metastatic extensive stage small cell carcinoma arising in the right chest overlapping the right upper lobe and right middle lobe and hilum with liver and bone disease. Dr. Jeryl Moris has reviewed the patient's case and I communicated his recommendations today via our conversation.  While the area that has increased in size based on his recent CT scan is in a similar area, it appears to be independent and somewhat more superior and posterior.  Dr.  Jeryl Moris has discussed a stereotactic approach in 5 fractions however if with additional dose accumulation having to alter that plan, we  did discuss the possibility that this could be an ultra hypofractionated course of up to 10 fractions.  We discussed the risks, benefits, short and long-term effects of radiotherapy and the patient is interested in proceeding.  We will delay simulation until May 5 or May 12 when the patient already has a scheduled lab visit in the cancer center as Dr. Jeryl Moris would not want to begin radiation until May 19 and ask medical oncology to hold off on his cycle to be administered that day until radiation has concluded.  The patient is in agreement with this plan.  We discussed the rationale for IV contrast at the time of simulation and when he comes for simulation we will sign written consent to proceed. 2. Risks of brain disease from small cell lung cancer.  We have spoken with the patient on this topic previously and he has desired to forego prophylactic cranial irradiation out of concerns for cognitive deficits that could be caused by this treatment.  He prefers to avoid routine brain mri as well. His last was negative for disease in July 2024, but CT brain in January was negative.  I will order routine MRI of the brain in about 1 1/2 months.  3. Right L3-4 nerve root compression. The patient remains in pain has had known nerve root compression on prior imaging that has been felt to be from degenerative changes and he is interested in discussing options of interventional pain management with an anesthesiologist in pain management. I will refer him to Dr. Rogelia Clarks.   This encounter was conducted via telephone.  The patient has provided two factor identification and has given verbal consent for this type of encounter and has been advised to only accept a meeting of this type in a secure network environment. The time spent during this encounter was 35 minutes including preparation, discussion, and coordination of the patient's care. The attendants for this meeting includeAlison Storm Ellen  and Rickard Charles.   During the encounter, Bettejane Brownie was located at [facility] Radiation Oncology Department.  Aundrea Higginbotham was located at home.     Shelvia Dick, PAC

## 2024-04-11 ENCOUNTER — Inpatient Hospital Stay: Payer: PRIVATE HEALTH INSURANCE | Admitting: Nurse Practitioner

## 2024-04-11 ENCOUNTER — Other Ambulatory Visit

## 2024-04-11 ENCOUNTER — Inpatient Hospital Stay

## 2024-04-11 ENCOUNTER — Other Ambulatory Visit: Payer: Medicare Other

## 2024-04-11 ENCOUNTER — Inpatient Hospital Stay: Payer: Medicare Other

## 2024-04-11 ENCOUNTER — Inpatient Hospital Stay (HOSPITAL_BASED_OUTPATIENT_CLINIC_OR_DEPARTMENT_OTHER): Payer: Medicare Other | Admitting: Internal Medicine

## 2024-04-11 VITALS — HR 98

## 2024-04-11 DIAGNOSIS — C3411 Malignant neoplasm of upper lobe, right bronchus or lung: Secondary | ICD-10-CM

## 2024-04-11 DIAGNOSIS — Z515 Encounter for palliative care: Secondary | ICD-10-CM | POA: Diagnosis not present

## 2024-04-11 DIAGNOSIS — Z95828 Presence of other vascular implants and grafts: Secondary | ICD-10-CM

## 2024-04-11 DIAGNOSIS — Z5111 Encounter for antineoplastic chemotherapy: Secondary | ICD-10-CM | POA: Diagnosis not present

## 2024-04-11 DIAGNOSIS — G893 Neoplasm related pain (acute) (chronic): Secondary | ICD-10-CM | POA: Diagnosis not present

## 2024-04-11 DIAGNOSIS — J90 Pleural effusion, not elsewhere classified: Secondary | ICD-10-CM | POA: Diagnosis not present

## 2024-04-11 DIAGNOSIS — C7951 Secondary malignant neoplasm of bone: Secondary | ICD-10-CM | POA: Diagnosis not present

## 2024-04-11 LAB — CBC WITH DIFFERENTIAL (CANCER CENTER ONLY)
Abs Immature Granulocytes: 0.02 10*3/uL (ref 0.00–0.07)
Basophils Absolute: 0 10*3/uL (ref 0.0–0.1)
Basophils Relative: 1 %
Eosinophils Absolute: 0 10*3/uL (ref 0.0–0.5)
Eosinophils Relative: 1 %
HCT: 26.3 % — ABNORMAL LOW (ref 39.0–52.0)
Hemoglobin: 8.2 g/dL — ABNORMAL LOW (ref 13.0–17.0)
Immature Granulocytes: 1 %
Lymphocytes Relative: 9 %
Lymphs Abs: 0.3 10*3/uL — ABNORMAL LOW (ref 0.7–4.0)
MCH: 31.3 pg (ref 26.0–34.0)
MCHC: 31.2 g/dL (ref 30.0–36.0)
MCV: 100.4 fL — ABNORMAL HIGH (ref 80.0–100.0)
Monocytes Absolute: 0.4 10*3/uL (ref 0.1–1.0)
Monocytes Relative: 12 %
Neutro Abs: 3 10*3/uL (ref 1.7–7.7)
Neutrophils Relative %: 76 %
Platelet Count: 223 10*3/uL (ref 150–400)
RBC: 2.62 MIL/uL — ABNORMAL LOW (ref 4.22–5.81)
RDW: 21.5 % — ABNORMAL HIGH (ref 11.5–15.5)
WBC Count: 3.8 10*3/uL — ABNORMAL LOW (ref 4.0–10.5)
nRBC: 0 % (ref 0.0–0.2)

## 2024-04-11 LAB — CMP (CANCER CENTER ONLY)
ALT: 13 U/L (ref 0–44)
AST: 22 U/L (ref 15–41)
Albumin: 3.4 g/dL — ABNORMAL LOW (ref 3.5–5.0)
Alkaline Phosphatase: 86 U/L (ref 38–126)
Anion gap: 7 (ref 5–15)
BUN: 15 mg/dL (ref 8–23)
CO2: 29 mmol/L (ref 22–32)
Calcium: 8.2 mg/dL — ABNORMAL LOW (ref 8.9–10.3)
Chloride: 105 mmol/L (ref 98–111)
Creatinine: 1.19 mg/dL (ref 0.61–1.24)
GFR, Estimated: >60 mL/min (ref >60)
Glucose, Bld: 177 mg/dL — ABNORMAL HIGH (ref 70–99)
Potassium: 4.5 mmol/L (ref 3.5–5.1)
Sodium: 141 mmol/L (ref 135–145)
Total Bilirubin: 0.3 mg/dL (ref 0.0–1.2)
Total Protein: 5.5 g/dL — ABNORMAL LOW (ref 6.5–8.1)

## 2024-04-11 LAB — SAMPLE TO BLOOD BANK

## 2024-04-11 MED ORDER — SODIUM CHLORIDE 0.9 % IV SOLN: INTRAVENOUS | Status: DC

## 2024-04-11 MED ORDER — TOPOTECAN HCL CHEMO INJECTION 4 MG
1.0000 mg/m2 | Freq: Once | INTRAVENOUS | Status: AC
  Administered 2024-04-11: 2.4 mg via INTRAVENOUS
  Filled 2024-04-11: qty 2.4

## 2024-04-11 MED ORDER — SODIUM CHLORIDE 0.9% FLUSH
10.0000 mL | Freq: Once | INTRAVENOUS | Status: AC
Start: 2024-04-11 — End: 2024-04-11
  Administered 2024-04-11: 10 mL

## 2024-04-11 MED ORDER — HEPARIN SOD (PORK) LOCK FLUSH 100 UNIT/ML IV SOLN
500.0000 [IU] | Freq: Once | INTRAVENOUS | Status: AC | PRN
Start: 2024-04-11 — End: 2024-04-11
  Administered 2024-04-11: 500 [IU]

## 2024-04-11 MED ORDER — TRILACICLIB DIHYDROCHLORIDE INJECTION 300 MG
240.0000 mg/m2 | Freq: Once | INTRAVENOUS | Status: AC
  Administered 2024-04-11: 600 mg via INTRAVENOUS
  Filled 2024-04-11: qty 40

## 2024-04-11 MED ORDER — SODIUM CHLORIDE 0.9% FLUSH
10.0000 mL | INTRAVENOUS | Status: DC | PRN
  Administered 2024-04-11: 10 mL

## 2024-04-11 MED ORDER — PROCHLORPERAZINE MALEATE 10 MG PO TABS
10.0000 mg | ORAL_TABLET | Freq: Once | ORAL | Status: AC
  Administered 2024-04-11: 10 mg via ORAL
  Filled 2024-04-11: qty 1

## 2024-04-11 NOTE — Progress Notes (Signed)
 [facility] [phone]   [phone]  OFFICE PROGRESS NOTE  Heilingoetter, Leita Purdue, PA-C [address] [place] [address]  DIAGNOSIS: Relapsed extensive stage (T4, N3, M1c)  small cell lung cancer diagnosed in April 2021 and presented with large right upper lobe/right hilar mass with ipsilateral and contralateral mediastinal and right supraclavicular lymphadenopathy in addition to multiple liver lesions. He has disease progression after the first line of chemotherapy in December 2021.  PRIOR THERAPY: 1) Palliative radiotherapy to the right upper lobe lung mass under the care of Dr. Jeryl Moris. 2) Systemic chemotherapy with carboplatin  for AUC of 5 on day 1, etoposide  100 mg/M2 on days 1, 2 and 3 in addition to Imfinzi  1500 mg IV every 3 weeks with chemotherapy then every 4 weeks for maintenance if the patient has no evidence for progression.  He will also receive Cosela  240 mg/m2 on the days of the chemotherapy.  Status post 9 cycles.  Starting from cycle #5 the patient will be on maintenance treatment with immunotherapy with Imfinzi  1500 mg IV every 4 weeks. Last dose of chemotherapy was given on November 13, 2020. This treatment was discontinued secondary to disease progression 3) Systemic chemotherapy with carboplatin  for AUC of 5 on day 1, etoposide  100 mg/M2 on days 1, 2 and 3, Tecentriq  1200 mg IV every 3 weeks as well as Cosela  250 mg/M2 on the days of the chemotherapy every 3 weeks.  First dose December 18, 2020.  Status post 8 cycles. 4) Zepzelca  (lurbinectedin ) 3.2 mgm2 IV every 3 weeks. Last dose on 01/23/22. Status post 12 cycles.  5) Palliative systemic chemotherapy with irinotecan  65 mg/m2 on days 1 and 8 IV every 3 weeks.  Status post 3 cycles.  Last dose was given April 01, 2022 discontinued secondary to disease progression. 6) SBRT to the progressive liver lesions under the care of Dr. Jeryl Moris.  Last fraction January 27, 2023. 7)  systemic chemotherapy with carboplatin  for AUC of 5 on day 1, etoposide  100 Mg/M2 on days 1, 2 and 3 with Cosela  before the chemotherapy.  First dose expected to start on 05/28/2022.  Status post 15 cycles.  Starting from cycle #5 his carboplatin  will be reduced to AUC of 4 and 2 etoposide  80 Mg/M2.  Last dose was giving April 07, 2023 discontinued for suspicious disease progression. 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  First dose January 04, 2024.  Status post 4  cycle.  INTERVAL HISTORY: Adam Wise 73 y.o. male returns to the clinic today for follow-up visit accompanied by his wife. Discussed the use of AI scribe software for clinical note transcription with the patient, who gave verbal consent to proceed.  History of Present Illness   Adam Wise "Adam Wise" is a 73 year old male with extensive stage small cell lung cancer who presents for cycle number five of topotecan  treatment.  He has a history of extensive stage small cell lung cancer diagnosed in April 2021 and has undergone multiple systemic chemotherapy regimens including carboplatin , etoposide , Imfinzi , irinotecan , and trials with lurbinectedin  and tarlatamab. Currently, he is on topotecan  1.2 mg/m2 on days one to five every three weeks with Cosela  support.  He experiences increased fatigue and shaking in his hands, which he describes as 'shakes'. These symptoms are more pronounced than usual,  although he has experienced similar symptoms in the past.  Recent lab results show a hemoglobin level of 8.2, platelets at 223,000, and a white blood count of 3.8, indicating recovery from previous low counts.       MEDICAL HISTORY: Past Medical History:  Diagnosis Date   Acute on chronic respiratory failure with hypoxia (HCC) 10/12/2021   Chickenpox    Chronic knee pain     Chronic low back pain    COPD exacerbation (HCC) 10/11/2021   COPD with exacerbation (HCC) 10/12/2021   Coronary artery calcification seen on CAT scan 11/2021   Coronary CTA 11/27/2021: Moderate to large right pleural effusion and compressive atelectasis right lung base. => Coronary Calcium Score 657.  Diffuse RCA calcification.  Minimal mild disease in the LAD and diagonal branches. == Overall limited study.  Notable artifact.   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 He has disease progression after the first line of chemotherapy in December 2021.   Testosterone  deficiency    Type 2 diabetes mellitus (HCC)     ALLERGIES:  is allergic to bupropion and hydrochlorothiazide.  MEDICATIONS:  Current Outpatient Medications  Medication Sig Dispense Refill   albuterol  (VENTOLIN  HFA) 108 (90 Base) MCG/ACT inhaler INHALE 2 PUFFS INTO THE LUNGS EVERY 4 (FOUR) HOURS AS NEEDED FOR SHORTNESS OF BREATH 18 each 0   amLODipine  (NORVASC ) 10 MG tablet Take 0.5 tablets (5 mg total) by mouth daily. TAKE 1 TABLET BY MOUTH EVERY DAY for blood pressure.     B-D 3CC LUER-LOK SYR 22GX1" 22G X 1" 3 ML MISC USE AS INSTRUCTED FOR TESTOSTERONE  INJECTION EVERY 2 WEEKS 10 each 2   Black Pepper-Turmeric (TURMERIC COMPLEX/BLACK PEPPER PO) Take 1 tablet by mouth in the morning and at bedtime.     Coenzyme Q10 (COQ10) 200 MG CAPS Take 200 mg by mouth at bedtime.     cyclobenzaprine  (FLEXERIL ) 5 MG tablet TAKE 1 TABLET BY MOUTH AT BEDTIME AS NEEDED FOR MUSCLE SPASMS. 90 tablet 0   docusate sodium  (COLACE) 100 MG capsule  Take 1 capsule (100 mg total) by mouth every 12 (twelve) hours. 60 capsule 0   ELIQUIS  5 MG TABS tablet TAKE 1 TABLET BY MOUTH TWICE A DAY 180 tablet 2   esomeprazole (NEXIUM) 20 MG capsule Take 20 mg by mouth in the morning.     gabapentin  (NEURONTIN ) 300 MG capsule TAKE 1 CAPSULE (300 MG TOTAL) BY MOUTH 2 (TWO) TIMES DAILY. FOR BACK PAIN. 180 capsule 1   glucose blood (ONETOUCH ULTRA) test strip USE UP TO 4 TIMES DAILY AS DIRECTED 400 strip 5   Insulin  Pen Needle (BD PEN NEEDLE NANO U/F) 32G X 4 MM MISC Use with insulin  as prescribed Dx Code: E11.9 100 each 3   ipratropium-albuterol  (DUONEB) 0.5-2.5 (3) MG/3ML SOLN Take 3 mLs by nebulization every 4 (four) hours while awake for 3 days, THEN 3 mLs every 4 (four) hours as needed (shortness of breath or wheezing). 360 mL 0   KRILL OIL PO Take 1 capsule by mouth daily.     Lancets MISC USE UP TO 3 TIMES  DAILY AS DIRECTED 100 each 2   lidocaine  (LIDODERM ) 5 % Place 1 patch onto the skin daily. Remove & Discard patch within 12 hours or as directed by MD 30 patch 0   losartan  (COZAAR ) 50 MG tablet TAKE 1 TABLET (50 MG TOTAL) BY MOUTH DAILY. FOR BLOOD PRESSURE. 90 tablet 3   Melatonin 10 MG CAPS Take 10 mg by mouth at bedtime as needed (for sleep).     metFORMIN  (GLUCOPHAGE ) 1000 MG tablet TAKE 1 TABLET (1,000 MG TOTAL) BY MOUTH 2 (TWO) TIMES DAILY WITH A MEAL. FOR DIABETES 180 tablet 1   mirtazapine  (REMERON ) 15 MG tablet TAKE 1 TABLET BY MOUTH EVERYDAY AT BEDTIME 90 tablet 1   morphine  (MS CONTIN ) 30 MG 12 hr tablet Take 1 tablet (30 mg total) by mouth 2 (two) times daily. 60 tablet 0   Multiple Vitamin (MULTI-VITAMINS) TABS Take 1 tablet by mouth daily with breakfast.     Naloxone  HCl 3 MG/0.1ML LIQD Place 1 spray into both nostrils once. For known/suspected opiod overdose. Every 2-3 minutes in alternating nostril till EMS arrives.     nystatin  (MYCOSTATIN ) 100000 UNIT/ML suspension Take 5 mLs (500,000 Units total) by mouth 4 (four) times daily. 60 mL  0   ondansetron  (ZOFRAN ) 4 MG tablet Take 1 tablet (4 mg total) by mouth every 8 (eight) hours as needed for nausea or vomiting. 30 tablet 0   oxyCODONE -acetaminophen  (PERCOCET) 10-325 MG tablet Take 1 tablet by mouth every 4 (four) hours as needed. 60 tablet 0   pioglitazone  (ACTOS ) 45 MG tablet TAKE 1 TABLET (45 MG TOTAL) BY MOUTH DAILY. FOR DIABETES. 90 tablet 1   polyethylene glycol powder (GLYCOLAX /MIRALAX ) 17 GM/SCOOP powder Take by mouth once.     pravastatin  (PRAVACHOL ) 40 MG tablet TAKE 1 TABLET BY MOUTH EVERY DAY IN THE EVENING FOR CHOLESTEROL 90 tablet 3   PRESCRIPTION MEDICATION CPAP- At bedtime     testosterone  cypionate (DEPOTESTOSTERONE CYPIONATE) 200 MG/ML injection Inject 200 mg into the muscle every 14 (fourteen) days.     No current facility-administered medications for this visit.   Facility-Administered Medications Ordered in Other Visits  Medication Dose Route Frequency Provider Last Rate Last Admin   heparin  lock flush 100 unit/mL  500 Units Intracatheter Once Marlene Simas, MD       sodium chloride  flush (NS) 0.9 % injection 10 mL  10 mL Intracatheter Once Marlene Simas, MD        SURGICAL HISTORY:  Past Surgical History:  Procedure Laterality Date   CHEST TUBE INSERTION Right 01/20/2022   Procedure: CHEST TUBE INSERTION;  Surgeon: Josiah Nigh, MD;  Location: [facility] ENDOSCOPY;  Service: Pulmonary;  Laterality: Right;  w/ Talc  Pleurodesis, planned admit for Obs afterwards   CHEST TUBE INSERTION Right 04/28/2022   Procedure: INSERTION PLEURAL DRAINAGE CATHETER - Pigtail tail, drainage;  Surgeon: Josiah Nigh, MD;  Location: [facility] ENDOSCOPY;  Service: Pulmonary;  Laterality: Right;  talc  pleurodesis   CHEST TUBE INSERTION Right 09/01/2022   Procedure: INSERTION PLEURAL DRAINAGE CATHETER;  Surgeon: Marine Sia, MD;  Location: [facility] ENDOSCOPY;  Service: Cardiopulmonary;  Laterality: Right;  pigtail catheter placement with talc  pleurodesis   COLONOSCOPY WITH PROPOFOL   N/A 12/17/2018   Procedure: COLONOSCOPY WITH PROPOFOL ;  Surgeon: Irby Mannan, MD;  Location: [facility] ENDOSCOPY;  Service: Gastroenterology;  Laterality: N/A;   IR IMAGING GUIDED PORT INSERTION  04/17/2020   IR THORACENTESIS ASP PLEURAL SPACE W/IMG GUIDE  11/29/2021   IR THORACENTESIS ASP PLEURAL  SPACE W/IMG GUIDE  01/03/2022   JOINT REPLACEMENT Bilateral    REPLACEMENT TOTAL KNEE BILATERAL  2015   TALC  PLEURODESIS  09/01/2022   Procedure: TALC  PLEURADESIS;  Surgeon: Marine Sia, MD;  Location: [facility] ENDOSCOPY;  Service: Cardiopulmonary;;   TONSILLECTOMY  1960   TRANSTHORACIC ECHOCARDIOGRAM  05/2020   a) 05/2020: EF 55 to 60%.  No R WMA.  Mild LVH.  Indeterminate LVEDP.  Unable to assess RVP.  Normal aortic and mitral valves.  Mildly elevated RAP.; b) 09/2021: EF 50-55%. No RWMA. Mild LVH. ~ LVEDP. Mild LA Dilation. NORMAL RV/RAP.  Normal MV/AoV.    REVIEW OF SYSTEMS:  Constitutional: positive for fatigue Eyes: negative Ears, nose, mouth, throat, and face: negative Respiratory: negative Cardiovascular: negative Gastrointestinal: negative Genitourinary:negative Integument/breast: negative Hematologic/lymphatic: negative Musculoskeletal:positive for arthralgias Neurological: positive for tremors Behavioral/Psych: negative Endocrine: negative Allergic/Immunologic: negative   PHYSICAL EXAMINATION: General appearance: alert, cooperative, fatigued, and no distress Head: Normocephalic, without obvious abnormality, atraumatic Neck: no adenopathy, no JVD, supple, symmetrical, trachea midline, and thyroid  not enlarged, symmetric, no tenderness/mass/nodules Lymph nodes: Cervical, supraclavicular, and axillary nodes normal. Resp: clear to auscultation bilaterally Back: symmetric, no curvature. ROM normal. No CVA tenderness. Cardio: regular rate and rhythm, S1, S2 normal, no murmur, click, rub or gallop GI: soft, non-tender; bowel sounds normal; no masses,  no organomegaly Extremities:  extremities normal, atraumatic, no cyanosis or edema Neurologic: Alert and oriented X 3, normal strength and tone. Normal symmetric reflexes. Normal coordination and gait  ECOG PERFORMANCE STATUS: 1 - Symptomatic but completely ambulatory  There were no vitals taken for this visit.   LABORATORY DATA: Lab Results  Component Value Date   WBC 3.8 (L) 04/11/2024   HGB 8.2 (L) 04/11/2024   HCT 26.3 (L) 04/11/2024   MCV 100.4 (H) 04/11/2024   PLT 223 04/11/2024      Chemistry      Component Value Date/Time   NA 141 04/11/2024 1042   K 4.5 04/11/2024 1042   CL 105 04/11/2024 1042   CO2 29 04/11/2024 1042   BUN 15 04/11/2024 1042   CREATININE 1.19 04/11/2024 1042   CREATININE 1.45 (H) 10/18/2021 1533      Component Value Date/Time   CALCIUM 8.2 (L) 04/11/2024 1042   ALKPHOS 86 04/11/2024 1042   AST 22 04/11/2024 1042   ALT 13 04/11/2024 1042   BILITOT 0.3 04/11/2024 1042       RADIOGRAPHIC STUDIES:    ASSESSMENT AND PLAN: This is a very pleasant 73 year old Caucasian male diagnosed with extensive stage (T4, N3, M1C) small cell lung cancer presented with large right upper lobe lung mass in addition to mediastinal and right supraclavicular lymphadenopathy and multiple metastatic liver lesions diagnosed in April 2021. The patient initially underwent systemic chemotherapy with carboplatin  for an AUC of 5 on day 1, etoposide  100 mg per metered squared on days 1, 2, and 3 in addition to Cosela  for myeloprotection.  He also received immunotherapy with Imfinzi  on day 1 of every chemotherapy cycle.  He is status post 9 cycles.  Starting from cycle #5 he was on single agent immunotherapy with Imfinzi  IV every 4 weeks.  The patient had evidence of disease progression on his scan from December 2021.  He was then started on systemic chemotherapy with carboplatin  for an AUC of 5 on day 1, etoposide  100 mg per metered squared on days 1, 2, and 3 in addition to Tecentriq  1200 mg on day 1, the  patient is status  post 9 cycles.  Starting from cycle #5, the patient started maintenance immunotherapy with Tecentriq .  This was discontinued due to evidence of disease progression. The patient started third line treatment with Zepzelca  (lurbinectedin ) 3.2 Mg/M2 every 3-week status post 12 cycles of this treatment and he has been tolerating it fairly well except for fatigue.  This treatment was discontinued secondary to disease progression. He underwent fourth line treatment with systemic chemotherapy with irinotecan  65 Mg/M2 on days 1 and 8 every 3 weeks.  Status post 3 cycles.  He tolerated this treatment well with no concerning adverse effect except for mild fatigue. Unfortunately his scan showed evidence for disease progression especially in the liver. I recommended for the patient to discontinue his current treatment with irinotecan  at this point. He started systemic chemotherapy again with carboplatin  for AUC of 5 on day 1, etoposide  100 Mg/M2 on days 1, 2 and 3 with Cosela  status post 15 cycles. I did reduce the dose of carboplatin  to AUC of 4 and etoposide  80 Mg/M2 starting from cycle #5.  He has been tolerating this treatment fairly well with no concerning complaints except for the increasing fatigue and weakness as well as the chemotherapy-induced pancytopenia. He had repeat CT scan of the chest, abdomen and pelvis performed recently.  I personally and independently reviewed the scan images and discussed the result and showed the images to the patient and his wife.  His scan showed redemonstration of the posttreatment/postradiation appearance of the right chest with dense fibrosis and consolidation of the perihilar right lung as well as a small loculated appearing right pleural effusion with associated pleural densities suggesting prior talc  pleurodesis.  There was significantly increased areas of masslike consolidation in the right lung for example in the peripheral right upper lobe and superior  segment of the right lower lobe with extensive new associated nodularity concerning for local malignant recurrence. The patient is also more symptomatic with more dry cough and shortness of breath. I recommended for him to hold any additional treatment with chemotherapy for now. The patient was seen by Dr. Sibyl Drafts at [facility] and treated with tarlatamab which is a by specific T-cell engager (BiTE) for the last 8 months.  Unfortunately he had evidence for disease progression after this treatment and it was discontinued. He started systemic chemotherapy with Topotecan  1.2 mg/M2 on days 1-5 every 3 weeks with Cosela  240 Mg/M2 IV before the chemotherapy.  First dose January 04, 2024.  Status post 4 cycle.  The start of cycle #2 was delayed by 2 weeks secondary to significant pancytopenia    Extensive stage small cell lung cancer Extensive stage small cell lung cancer, initially diagnosed in April 2021. Currently receiving topotecan  1.2 mg/m2 on days 1-5 every three weeks with Cosela  support. Reports increased fatigue and hand tremors. Recent labs show hemoglobin at 8.2, platelets at 223,000, and WBC at 3.8. Scheduled for radiation therapy on May 5th. Despite potential blood count drop, he prefers to proceed with chemotherapy today. Informed of the need for close monitoring of blood counts due to potential hemoglobin and other count drops. - Administer topotecan  1.2 mg/m2 on days 1-5 every three weeks with Cosela  support. - Monitor blood counts weekly. - Proceed with radiation therapy setup on May 5th.  Anemia due to chemotherapy Anemia secondary to chemotherapy, with hemoglobin at 8.2. No immediate transfusion needed, but potential for hemoglobin drop in coming weeks. Informed of possible transfusion requirement if hemoglobin decreases further. - Monitor hemoglobin levels weekly. - Prepare  for potential transfusion if hemoglobin drops further.  Tremor Tremor primarily in the hands, reported as  more frequent. No recent brain imaging planned as he has experienced this before and is under observation. - Monitor tremor symptoms.   The patient was advised to call immediately if he has any concerning symptoms in the interval. The patient voices understanding of current disease status and treatment options and is in agreement with the current care plan.  All questions were answered. The patient knows to call the clinic with any problems, questions or concerns. We can certainly see the patient much sooner if necessary.  The total time spent in the appointment was 30 minutes.  Disclaimer: This note was dictated with voice recognition software. Similar sounding words can inadvertently be transcribed and may not be corrected upon review. Aurelio Blower, MD 04/11/24

## 2024-04-11 NOTE — Patient Instructions (Signed)
 [facility] - A DEPT OF [facility]  Discharge Instructions: Thank you for choosing [facility] to provide your oncology and hematology care.   If you have a lab appointment with the Cancer Center, please go directly to the Cancer Center and check in at the registration area.   Wear comfortable clothing and clothing appropriate for easy access to any Portacath or PICC line.   We strive to give you quality time with your provider. You may need to reschedule your appointment if you arrive late (15 or more minutes).  Arriving late affects you and other patients whose appointments are after yours.  Also, if you miss three or more appointments without notifying the office, you may be dismissed from the clinic at the provider's discretion.      For prescription refill requests, have your pharmacy contact our office and allow 72 hours for refills to be completed.    Today you received the following chemotherapy and/or immunotherapy agents: topotecan (HYCAMTIN) and trilaciclib dihydrochloride     To help prevent nausea and vomiting after your treatment, we encourage you to take your nausea medication as directed.  BELOW ARE SYMPTOMS THAT SHOULD BE REPORTED IMMEDIATELY: *FEVER GREATER THAN 100.4 F (38 C) OR HIGHER *CHILLS OR SWEATING *NAUSEA AND VOMITING THAT IS NOT CONTROLLED WITH YOUR NAUSEA MEDICATION *UNUSUAL SHORTNESS OF BREATH *UNUSUAL BRUISING OR BLEEDING *URINARY PROBLEMS (pain or burning when urinating, or frequent urination) *BOWEL PROBLEMS (unusual diarrhea, constipation, pain near the anus) TENDERNESS IN MOUTH AND THROAT WITH OR WITHOUT PRESENCE OF ULCERS (sore throat, sores in mouth, or a toothache) UNUSUAL RASH, SWELLING OR PAIN  UNUSUAL VAGINAL DISCHARGE OR ITCHING   Items with * indicate a potential emergency and should be followed up as soon as possible or go to the Emergency Department if any problems should occur.  Please show the  CHEMOTHERAPY ALERT CARD or IMMUNOTHERAPY ALERT CARD at check-in to the Emergency Department and triage nurse.  Should you have questions after your visit or need to cancel or reschedule your appointment, please contact [facility] - A DEPT OF [facility]  Dept: [phone]  and follow the prompts.  Office hours are 8:00 a.m. to 4:30 p.m. Monday - Friday. Please note that voicemails left after 4:00 p.m. may not be returned until the following business day.  We are closed weekends and major holidays. You have access to a nurse at all times for urgent questions. Please call the main number to the clinic Dept: [phone] and follow the prompts.   For any non-urgent questions, you may also contact your provider using MyChart. We now offer e-Visits for anyone 1 and older to request care online for non-urgent symptoms. For details visit [email or web address].   Also download the MyChart app! Go to the app store, search "MyChart", open the app, select [facility], and log in with your MyChart username and password.  \[id number]\

## 2024-04-12 ENCOUNTER — Inpatient Hospital Stay: Payer: Medicare Other

## 2024-04-12 ENCOUNTER — Encounter: Payer: Self-pay | Admitting: Medical Oncology

## 2024-04-12 VITALS — BP 97/69 | HR 104 | Temp 98.1°F | Resp 20

## 2024-04-12 DIAGNOSIS — Z515 Encounter for palliative care: Secondary | ICD-10-CM | POA: Diagnosis not present

## 2024-04-12 DIAGNOSIS — Z5111 Encounter for antineoplastic chemotherapy: Secondary | ICD-10-CM | POA: Diagnosis not present

## 2024-04-12 DIAGNOSIS — C3411 Malignant neoplasm of upper lobe, right bronchus or lung: Secondary | ICD-10-CM | POA: Diagnosis not present

## 2024-04-12 DIAGNOSIS — C7951 Secondary malignant neoplasm of bone: Secondary | ICD-10-CM | POA: Diagnosis not present

## 2024-04-12 DIAGNOSIS — J90 Pleural effusion, not elsewhere classified: Secondary | ICD-10-CM | POA: Diagnosis not present

## 2024-04-12 DIAGNOSIS — G893 Neoplasm related pain (acute) (chronic): Secondary | ICD-10-CM | POA: Diagnosis not present

## 2024-04-12 MED ORDER — SODIUM CHLORIDE 0.9 % IV SOLN
1.0000 mg/m2 | Freq: Once | INTRAVENOUS | Status: AC
  Administered 2024-04-12: 2.4 mg via INTRAVENOUS
  Filled 2024-04-12: qty 2.4

## 2024-04-12 MED ORDER — TRILACICLIB DIHYDROCHLORIDE INJECTION 300 MG
240.0000 mg/m2 | Freq: Once | INTRAVENOUS | Status: AC
  Administered 2024-04-12: 600 mg via INTRAVENOUS
  Filled 2024-04-12: qty 40

## 2024-04-12 MED ORDER — PROCHLORPERAZINE MALEATE 10 MG PO TABS
10.0000 mg | ORAL_TABLET | Freq: Once | ORAL | Status: AC
  Administered 2024-04-12: 10 mg via ORAL
  Filled 2024-04-12: qty 1

## 2024-04-12 MED ORDER — SODIUM CHLORIDE 0.9 % IV SOLN: INTRAVENOUS | Status: DC

## 2024-04-12 MED ORDER — SODIUM CHLORIDE 0.9% FLUSH: 10.0000 mL | INTRAVENOUS | Status: DC | PRN

## 2024-04-12 MED ORDER — HEPARIN SOD (PORK) LOCK FLUSH 100 UNIT/ML IV SOLN: 500.0000 [IU] | Freq: Once | INTRAVENOUS | Status: DC | PRN

## 2024-04-12 NOTE — Progress Notes (Signed)
 Per Dr. Marguerita Shih , it is ok to treat pt today with Topotecan  and Cosellla and heart rate of 104-110.

## 2024-04-12 NOTE — Patient Instructions (Signed)
 [facility] - A DEPT OF [facility]  Discharge Instructions: Thank you for choosing [facility] to provide your oncology and hematology care.   If you have a lab appointment with the Cancer Center, please go directly to the Cancer Center and check in at the registration area.   Wear comfortable clothing and clothing appropriate for easy access to any Portacath or PICC line.   We strive to give you quality time with your provider. You may need to reschedule your appointment if you arrive late (15 or more minutes).  Arriving late affects you and other patients whose appointments are after yours.  Also, if you miss three or more appointments without notifying the office, you may be dismissed from the clinic at the provider's discretion.      For prescription refill requests, have your pharmacy contact our office and allow 72 hours for refills to be completed.    Today you received the following chemotherapy and/or immunotherapy agents: Topotecan , Cosella      To help prevent nausea and vomiting after your treatment, we encourage you to take your nausea medication as directed.  BELOW ARE SYMPTOMS THAT SHOULD BE REPORTED IMMEDIATELY: *FEVER GREATER THAN 100.4 F (38 C) OR HIGHER *CHILLS OR SWEATING *NAUSEA AND VOMITING THAT IS NOT CONTROLLED WITH YOUR NAUSEA MEDICATION *UNUSUAL SHORTNESS OF BREATH *UNUSUAL BRUISING OR BLEEDING *URINARY PROBLEMS (pain or burning when urinating, or frequent urination) *BOWEL PROBLEMS (unusual diarrhea, constipation, pain near the anus) TENDERNESS IN MOUTH AND THROAT WITH OR WITHOUT PRESENCE OF ULCERS (sore throat, sores in mouth, or a toothache) UNUSUAL RASH, SWELLING OR PAIN  UNUSUAL VAGINAL DISCHARGE OR ITCHING   Items with * indicate a potential emergency and should be followed up as soon as possible or go to the Emergency Department if any problems should occur.  Please show the CHEMOTHERAPY ALERT CARD or  IMMUNOTHERAPY ALERT CARD at check-in to the Emergency Department and triage nurse.  Should you have questions after your visit or need to cancel or reschedule your appointment, please contact [facility] - A DEPT OF [facility]  Dept: [phone]  and follow the prompts.  Office hours are 8:00 a.m. to 4:30 p.m. Monday - Friday. Please note that voicemails left after 4:00 p.m. may not be returned until the following business day.  We are closed weekends and major holidays. You have access to a nurse at all times for urgent questions. Please call the main number to the clinic Dept: [phone] and follow the prompts.   For any non-urgent questions, you may also contact your provider using MyChart. We now offer e-Visits for anyone 28 and older to request care online for non-urgent symptoms. For details visit [email or web address].   Also download the MyChart app! Go to the app store, search "MyChart", open the app, select [place], and log in with your MyChart username and password.

## 2024-04-13 ENCOUNTER — Inpatient Hospital Stay (HOSPITAL_BASED_OUTPATIENT_CLINIC_OR_DEPARTMENT_OTHER): Admitting: Nurse Practitioner

## 2024-04-13 ENCOUNTER — Inpatient Hospital Stay: Payer: Medicare Other

## 2024-04-13 ENCOUNTER — Encounter: Payer: Self-pay | Admitting: Nurse Practitioner

## 2024-04-13 ENCOUNTER — Encounter: Payer: Self-pay | Admitting: Radiation Oncology

## 2024-04-13 VITALS — BP 94/62 | HR 104 | Temp 98.0°F | Resp 20

## 2024-04-13 DIAGNOSIS — G893 Neoplasm related pain (acute) (chronic): Secondary | ICD-10-CM

## 2024-04-13 DIAGNOSIS — C3411 Malignant neoplasm of upper lobe, right bronchus or lung: Secondary | ICD-10-CM | POA: Diagnosis not present

## 2024-04-13 DIAGNOSIS — K5903 Drug induced constipation: Secondary | ICD-10-CM | POA: Diagnosis not present

## 2024-04-13 DIAGNOSIS — Z515 Encounter for palliative care: Secondary | ICD-10-CM | POA: Diagnosis not present

## 2024-04-13 DIAGNOSIS — J90 Pleural effusion, not elsewhere classified: Secondary | ICD-10-CM | POA: Diagnosis not present

## 2024-04-13 DIAGNOSIS — C7951 Secondary malignant neoplasm of bone: Secondary | ICD-10-CM | POA: Diagnosis not present

## 2024-04-13 DIAGNOSIS — Z5111 Encounter for antineoplastic chemotherapy: Secondary | ICD-10-CM | POA: Diagnosis not present

## 2024-04-13 MED ORDER — TRILACICLIB DIHYDROCHLORIDE INJECTION 300 MG
240.0000 mg/m2 | Freq: Once | INTRAVENOUS | Status: AC
Start: 2024-04-13 — End: 2024-04-13
  Administered 2024-04-13: 600 mg via INTRAVENOUS
  Filled 2024-04-13: qty 40

## 2024-04-13 MED ORDER — TOPOTECAN HCL CHEMO INJECTION 4 MG
1.0000 mg/m2 | Freq: Once | INTRAVENOUS | Status: AC
  Administered 2024-04-13: 2.4 mg via INTRAVENOUS
  Filled 2024-04-13: qty 2.4

## 2024-04-13 MED ORDER — SODIUM CHLORIDE 0.9% FLUSH
10.0000 mL | INTRAVENOUS | Status: DC | PRN
  Administered 2024-04-13: 10 mL

## 2024-04-13 MED ORDER — PROCHLORPERAZINE MALEATE 10 MG PO TABS
10.0000 mg | ORAL_TABLET | Freq: Once | ORAL | Status: AC
  Administered 2024-04-13: 10 mg via ORAL
  Filled 2024-04-13: qty 1

## 2024-04-13 MED ORDER — SODIUM CHLORIDE 0.9 % IV SOLN: INTRAVENOUS | Status: DC

## 2024-04-13 MED ORDER — HEPARIN SOD (PORK) LOCK FLUSH 100 UNIT/ML IV SOLN
500.0000 [IU] | Freq: Once | INTRAVENOUS | Status: AC | PRN
  Administered 2024-04-13: 500 [IU]

## 2024-04-13 NOTE — Progress Notes (Signed)
 Palliative Medicine [facility]  [phone] [phone]   Name: Adam Wise Date: 04/13/2024 MRN: [id number]  DOB: October 12, 1951  Patient Care Team: Heilingoetter, Ancil Balzarine as PCP - General (Physician Assistant) Arleen Lacer, MD as PCP - Cardiology (Cardiology) Jaye Mettle, RN as Oncology Nurse Navigator Pickenpack-Cousar, Giles Labrum, NP as Nurse Practitioner [facility] and Palliative Medicine)    INTERVAL HISTORY: Adam Wise is a 73 y.o. 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 Palliative ask to see for symptom management and goals of care.   SOCIAL HISTORY:     reports that he has quit smoking. He has never used smokeless tobacco. He reports that he does not currently use alcohol. He reports that he does not currently use drugs.  ADVANCE DIRECTIVES:  Advanced directives on file naming Adam Wise, Adam Wise, Adam Wise, and Adam Wise as decision makers for this pt should Mr.Oliva become unable to speak for himself.   CODE STATUS: Full code  PAST MEDICAL HISTORY: Past Medical History:  Diagnosis Date   Acute on chronic respiratory failure with hypoxia (HCC) 10/12/2021   Chickenpox    Chronic knee pain    Chronic low back pain    COPD exacerbation (HCC) 10/11/2021   COPD with exacerbation (HCC) 10/12/2021   Coronary artery calcification seen on CAT scan 11/2021   Coronary CTA 11/27/2021: Moderate to large right pleural effusion and compressive atelectasis right lung base. => Coronary Calcium Score 657.  Diffuse RCA calcification.  Minimal mild disease in the LAD and diagonal branches. == Overall limited study.  Notable artifact.   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 He has disease progression after the first line of chemotherapy in December 2021.   Testosterone  deficiency    Type 2 diabetes mellitus (HCC)     ALLERGIES:  is allergic to bupropion and hydrochlorothiazide.  MEDICATIONS:  Current Outpatient Medications  Medication Sig Dispense Refill   albuterol  (VENTOLIN  HFA) 108 (90 Base) MCG/ACT inhaler INHALE 2 PUFFS INTO THE LUNGS EVERY 4 (FOUR) HOURS AS NEEDED FOR SHORTNESS OF BREATH 18 each 0   amLODipine  (NORVASC ) 10 MG tablet Take 0.5 tablets (5 mg total) by mouth daily. TAKE 1 TABLET BY MOUTH EVERY DAY for blood pressure.     B-D 3CC LUER-LOK SYR 22GX1" 22G X 1" 3 ML MISC USE AS INSTRUCTED FOR TESTOSTERONE  INJECTION EVERY 2 WEEKS 10 each 2   Black Pepper-Turmeric (TURMERIC COMPLEX/BLACK PEPPER PO) Take 1 tablet by mouth in the morning and at bedtime.     Coenzyme Q10 (COQ10) 200 MG CAPS Take 200 mg by mouth at bedtime.     cyclobenzaprine  (FLEXERIL ) 5 MG tablet TAKE 1 TABLET BY MOUTH AT BEDTIME AS NEEDED FOR MUSCLE SPASMS. 90 tablet 0   docusate sodium  (COLACE) 100 MG capsule Take 1 capsule (100 mg total) by mouth every 12 (twelve) hours. 60 capsule 0   ELIQUIS  5 MG TABS tablet TAKE 1 TABLET BY MOUTH TWICE A DAY 180 tablet 2  esomeprazole (NEXIUM) 20 MG capsule Take 20 mg by mouth in the morning.     gabapentin  (NEURONTIN ) 300 MG capsule TAKE 1 CAPSULE (300 MG TOTAL) BY MOUTH 2 (TWO) TIMES DAILY. FOR BACK PAIN. 180 capsule 1   glucose blood (ONETOUCH ULTRA) test strip USE UP TO 4 TIMES DAILY AS DIRECTED 400 strip 5   Insulin  Pen Needle (BD PEN NEEDLE NANO U/F) 32G X 4 MM MISC Use with insulin  as prescribed Dx Code: E11.9 100 each 3   ipratropium-albuterol  (DUONEB) 0.5-2.5 (3) MG/3ML SOLN Take  3 mLs by nebulization every 4 (four) hours while awake for 3 days, THEN 3 mLs every 4 (four) hours as needed (shortness of breath or wheezing). 360 mL 0   KRILL OIL PO Take 1 capsule by mouth daily.     Lancets MISC USE UP TO 3 TIMES DAILY AS DIRECTED 100 each 2   lidocaine  (LIDODERM ) 5 % Place 1 patch onto the skin daily. Remove & Discard patch within 12 hours or as directed by MD 30 patch 0   losartan  (COZAAR ) 50 MG tablet TAKE 1 TABLET (50 MG TOTAL) BY MOUTH DAILY. FOR BLOOD PRESSURE. 90 tablet 3   Melatonin 10 MG CAPS Take 10 mg by mouth at bedtime as needed (for sleep).     metFORMIN  (GLUCOPHAGE ) 1000 MG tablet TAKE 1 TABLET (1,000 MG TOTAL) BY MOUTH 2 (TWO) TIMES DAILY WITH A MEAL. FOR DIABETES 180 tablet 1   mirtazapine  (REMERON ) 15 MG tablet TAKE 1 TABLET BY MOUTH EVERYDAY AT BEDTIME 90 tablet 1   morphine  (MS CONTIN ) 30 MG 12 hr tablet Take 1 tablet (30 mg total) by mouth 2 (two) times daily. 60 tablet 0   Multiple Vitamin (MULTI-VITAMINS) TABS Take 1 tablet by mouth daily with breakfast.     Naloxone  HCl 3 MG/0.1ML LIQD Place 1 spray into both nostrils once. For known/suspected opiod overdose. Every 2-3 minutes in alternating nostril till EMS arrives.     nystatin  (MYCOSTATIN ) 100000 UNIT/ML suspension Take 5 mLs (500,000 Units total) by mouth 4 (four) times daily. 60 mL 0   ondansetron  (ZOFRAN ) 4 MG tablet Take 1 tablet (4 mg total) by mouth every 8 (eight) hours as needed for nausea or vomiting. 30 tablet 0   oxyCODONE -acetaminophen  (PERCOCET) 10-325 MG tablet Take 1 tablet by mouth every 4 (four) hours as needed. 60 tablet 0   pioglitazone  (ACTOS ) 45 MG tablet TAKE 1 TABLET (45 MG TOTAL) BY MOUTH DAILY. FOR DIABETES. 90 tablet 1   polyethylene glycol powder (GLYCOLAX /MIRALAX ) 17 GM/SCOOP powder Take by mouth once.     pravastatin  (PRAVACHOL ) 40 MG tablet TAKE 1 TABLET BY MOUTH EVERY DAY IN THE EVENING FOR CHOLESTEROL 90 tablet 3   PRESCRIPTION MEDICATION CPAP- At bedtime      testosterone  cypionate (DEPOTESTOSTERONE CYPIONATE) 200 MG/ML injection Inject 200 mg into the muscle every 14 (fourteen) days.     No current facility-administered medications for this visit.   Facility-Administered Medications Ordered in Other Visits  Medication Dose Route Frequency Provider Last Rate Last Admin   0.9 %  sodium chloride  infusion   Intravenous Continuous Marlene Simas, MD   Stopped at 04/13/24 1507   heparin  lock flush 100 unit/mL  500 Units Intracatheter Once Marlene Simas, MD       sodium chloride  flush (NS) 0.9 % injection 10 mL  10 mL Intracatheter Once Mohamed, Mohamed, MD       sodium chloride  flush (NS) 0.9 % injection 10 mL  10  mL Intracatheter PRN Marlene Simas, MD   10 mL at 04/13/24 1504    VITAL SIGNS: There were no vitals taken for this visit. There were no vitals filed for this visit.  Estimated body mass index is 37.24 kg/m as calculated from the following:   Height as of 04/06/24: 5\' 9"  (1.753 m).   Weight as of 04/11/24: 252 lb 3 oz (114.4 kg).   PERFORMANCE STATUS (ECOG) : 1 - Symptomatic but completely ambulatory   Physical Exam General: NAD Cardiovascular: regular rate and rhythm Pulmonary: normal breathing pattern Extremities: no edema, no joint deformities Skin: no rashes Neurological: AAO x3  IMPRESSION: Discussed the use of AI scribe software for clinical note transcription with the patient, who gave verbal consent to proceed.  History of Present Illness Adam Wise "[name]" is a 73 year old male with lung cancer was seen during his infusion for symptom management follow-up. No acute distress. He is accompanied by his wife. Denies concerns of nausea, vomiting, constipation, or diarrhea. Is remaining as active as possible.   Mr. Vanhousen reports his pain is well controlled on current regimen. He continues to take MS Contin  every 12 hours without difficulty. Tylenol  as needed for mild aches and discomfort, but has reduced his use of  breakthrough Percocet, which he only takes occasionally.   Wife shares patient received a call regarding a brain MRI, which he had previously declined during a virtual appointment. He communicated his decision to decline the MRI after the call, as he had recently undergone a similar procedure and did not wish to repeat it.  All questions answered and support provided.   I discussed the importance of continued conversation with family and their medical providers regarding overall plan of care and treatment options, ensuring decisions are within the context of the patients values and GOCs.  Assessment & Plan Cancer Related Pain  Patient reports back pain is well controlled.  - Continue current pain management regimen with Tylenol  as needed. - Use Percocet sparingly for breakthrough pain. -MS Contin  30 mg every 12 hours -Miralax  daily for bowel regimen   Follow-up Follow-up planned in a couple of weeks unless needed sooner. - Schedule follow-up in 3-4 weeks. Sooner if needed.   Patient expressed understanding and was in agreement with this plan. He also understands that He can call the clinic at any time with any questions, concerns, or complaints.   Any controlled substances utilized were prescribed in the context of palliative care. PDMP has been reviewed.   Visit consisted of counseling and education dealing with the complex and emotionally intense issues of symptom management and palliative care in the setting of serious and potentially life-threatening illness.  Dellia Ferguson, AGPCNP-BC  Palliative Medicine Team/[facility]

## 2024-04-13 NOTE — Progress Notes (Signed)
 Per Cassie PA, ok to treat today with HR 105

## 2024-04-13 NOTE — Patient Instructions (Signed)
 [facility] A DEPT [facility]  Discharge Instructions: Thank you for choosing [facility] to provide your oncology and hematology care.   If you have a lab appointment with the Cancer Center, please go directly to the Cancer Center and check in at the registration area.   Wear comfortable clothing and clothing appropriate for easy access to any Portacath or PICC line.   We strive to give you quality time with your provider. You may need to reschedule your appointment if you arrive late (15 or more minutes).  Arriving late affects you and other patients whose appointments are after yours.  Also, if you miss three or more appointments without notifying the office, you may be dismissed from the clinic at the provider's discretion.      For prescription refill requests, have your pharmacy contact our office and allow 72 hours for refills to be completed.    Today you received the following chemotherapy and/or immunotherapy agents Topotecan, Cosela   To help prevent nausea and vomiting after your treatment, we encourage you to take your nausea medication as directed.  BELOW ARE SYMPTOMS THAT SHOULD BE REPORTED IMMEDIATELY: *FEVER GREATER THAN 100.4 F (38 C) OR HIGHER *CHILLS OR SWEATING *NAUSEA AND VOMITING THAT IS NOT CONTROLLED WITH YOUR NAUSEA MEDICATION *UNUSUAL SHORTNESS OF BREATH *UNUSUAL BRUISING OR BLEEDING *URINARY PROBLEMS (pain or burning when urinating, or frequent urination) *BOWEL PROBLEMS (unusual diarrhea, constipation, pain near the anus) TENDERNESS IN MOUTH AND THROAT WITH OR WITHOUT PRESENCE OF ULCERS (sore throat, sores in mouth, or a toothache) UNUSUAL RASH, SWELLING OR PAIN  UNUSUAL VAGINAL DISCHARGE OR ITCHING   Items with * indicate a potential emergency and should be followed up as soon as possible or go to the Emergency Department if any problems should occur.  Please show the CHEMOTHERAPY ALERT CARD or  IMMUNOTHERAPY ALERT CARD at check-in to the Emergency Department and triage nurse.  Should you have questions after your visit or need to cancel or reschedule your appointment, please contact [facility]  Dept: [phone]  and follow the prompts.  Office hours are 8:00 a.m. to 4:30 p.m. Monday - Friday. Please note that voicemails left after 4:00 p.m. may not be returned until the following business day.  We are closed weekends and major holidays. You have access to a nurse at all times for urgent questions. Please call the main number to the clinic Dept: [phone] and follow the prompts.   For any non-urgent questions, you may also contact your provider using MyChart. We now offer e-Visits for anyone 61 and older to request care online for non-urgent symptoms. For details visit [email or web address].   Also download the MyChart app! Go to the app store, search "MyChart", open the app, select [facility], and log in with your MyChart username and password.

## 2024-04-14 ENCOUNTER — Inpatient Hospital Stay: Payer: Medicare Other

## 2024-04-14 VITALS — BP 110/70 | HR 105 | Temp 98.2°F | Resp 18

## 2024-04-14 DIAGNOSIS — C3411 Malignant neoplasm of upper lobe, right bronchus or lung: Secondary | ICD-10-CM | POA: Diagnosis not present

## 2024-04-14 DIAGNOSIS — C7951 Secondary malignant neoplasm of bone: Secondary | ICD-10-CM | POA: Insufficient documentation

## 2024-04-14 DIAGNOSIS — D6481 Anemia due to antineoplastic chemotherapy: Secondary | ICD-10-CM | POA: Insufficient documentation

## 2024-04-14 DIAGNOSIS — Z5111 Encounter for antineoplastic chemotherapy: Secondary | ICD-10-CM | POA: Insufficient documentation

## 2024-04-14 DIAGNOSIS — C772 Secondary and unspecified malignant neoplasm of intra-abdominal lymph nodes: Secondary | ICD-10-CM | POA: Insufficient documentation

## 2024-04-14 DIAGNOSIS — Z510 Encounter for antineoplastic radiation therapy: Secondary | ICD-10-CM | POA: Insufficient documentation

## 2024-04-14 DIAGNOSIS — Z452 Encounter for adjustment and management of vascular access device: Secondary | ICD-10-CM | POA: Insufficient documentation

## 2024-04-14 MED ORDER — SODIUM CHLORIDE 0.9% FLUSH
10.0000 mL | INTRAVENOUS | Status: DC | PRN
  Administered 2024-04-14: 10 mL

## 2024-04-14 MED ORDER — TOPOTECAN HCL CHEMO INJECTION 4 MG
1.0000 mg/m2 | Freq: Once | INTRAVENOUS | Status: AC
  Administered 2024-04-14: 2.4 mg via INTRAVENOUS
  Filled 2024-04-14: qty 2.4

## 2024-04-14 MED ORDER — PROCHLORPERAZINE MALEATE 10 MG PO TABS
10.0000 mg | ORAL_TABLET | Freq: Once | ORAL | Status: AC
  Administered 2024-04-14: 10 mg via ORAL
  Filled 2024-04-14: qty 1

## 2024-04-14 MED ORDER — SODIUM CHLORIDE 0.9 % IV SOLN: INTRAVENOUS | Status: DC

## 2024-04-14 MED ORDER — TRILACICLIB DIHYDROCHLORIDE INJECTION 300 MG
240.0000 mg/m2 | Freq: Once | INTRAVENOUS | Status: AC
  Administered 2024-04-14: 600 mg via INTRAVENOUS
  Filled 2024-04-14: qty 40

## 2024-04-14 MED ORDER — HEPARIN SOD (PORK) LOCK FLUSH 100 UNIT/ML IV SOLN
500.0000 [IU] | Freq: Once | INTRAVENOUS | Status: AC | PRN
  Administered 2024-04-14: 500 [IU]

## 2024-04-14 NOTE — Patient Instructions (Signed)
 [facility] - A DEPT OF [facility]  Discharge Instructions: Thank you for choosing [facility] to provide your oncology and hematology care.   If you have a lab appointment with the Cancer Center, please go directly to the Cancer Center and check in at the registration area.   Wear comfortable clothing and clothing appropriate for easy access to any Portacath or PICC line.   We strive to give you quality time with your provider. You may need to reschedule your appointment if you arrive late (15 or more minutes).  Arriving late affects you and other patients whose appointments are after yours.  Also, if you miss three or more appointments without notifying the office, you may be dismissed from the clinic at the provider's discretion.      For prescription refill requests, have your pharmacy contact our office and allow 72 hours for refills to be completed.    Today you received the following chemotherapy and/or immunotherapy agents: Topotecan.      To help prevent nausea and vomiting after your treatment, we encourage you to take your nausea medication as directed.  BELOW ARE SYMPTOMS THAT SHOULD BE REPORTED IMMEDIATELY: *FEVER GREATER THAN 100.4 F (38 C) OR HIGHER *CHILLS OR SWEATING *NAUSEA AND VOMITING THAT IS NOT CONTROLLED WITH YOUR NAUSEA MEDICATION *UNUSUAL SHORTNESS OF BREATH *UNUSUAL BRUISING OR BLEEDING *URINARY PROBLEMS (pain or burning when urinating, or frequent urination) *BOWEL PROBLEMS (unusual diarrhea, constipation, pain near the anus) TENDERNESS IN MOUTH AND THROAT WITH OR WITHOUT PRESENCE OF ULCERS (sore throat, sores in mouth, or a toothache) UNUSUAL RASH, SWELLING OR PAIN  UNUSUAL VAGINAL DISCHARGE OR ITCHING   Items with * indicate a potential emergency and should be followed up as soon as possible or go to the Emergency Department if any problems should occur.  Please show the CHEMOTHERAPY ALERT CARD or IMMUNOTHERAPY  ALERT CARD at check-in to the Emergency Department and triage nurse.  Should you have questions after your visit or need to cancel or reschedule your appointment, please contact [facility] - A DEPT OF [facility]  Dept: [phone]  and follow the prompts.  Office hours are 8:00 a.m. to 4:30 p.m. Monday - Friday. Please note that voicemails left after 4:00 p.m. may not be returned until the following business day.  We are closed weekends and major holidays. You have access to a nurse at all times for urgent questions. Please call the main number to the clinic Dept: [phone] and follow the prompts.   For any non-urgent questions, you may also contact your provider using MyChart. We now offer e-Visits for anyone 3 and older to request care online for non-urgent symptoms. For details visit [email or web address].   Also download the MyChart app! Go to the app store, search "MyChart", open the app, select [place], and log in with your MyChart username and password.

## 2024-04-14 NOTE — Progress Notes (Signed)
 Per Cassie, PA ok to treat today with pulse of 105

## 2024-04-15 ENCOUNTER — Inpatient Hospital Stay: Payer: Medicare Other

## 2024-04-15 VITALS — BP 103/66 | HR 105 | Temp 98.5°F | Resp 20

## 2024-04-15 DIAGNOSIS — Z5111 Encounter for antineoplastic chemotherapy: Secondary | ICD-10-CM | POA: Diagnosis not present

## 2024-04-15 DIAGNOSIS — C3411 Malignant neoplasm of upper lobe, right bronchus or lung: Secondary | ICD-10-CM

## 2024-04-15 DIAGNOSIS — Z510 Encounter for antineoplastic radiation therapy: Secondary | ICD-10-CM | POA: Diagnosis not present

## 2024-04-15 DIAGNOSIS — D6481 Anemia due to antineoplastic chemotherapy: Secondary | ICD-10-CM | POA: Diagnosis not present

## 2024-04-15 DIAGNOSIS — C772 Secondary and unspecified malignant neoplasm of intra-abdominal lymph nodes: Secondary | ICD-10-CM | POA: Diagnosis not present

## 2024-04-15 DIAGNOSIS — Z452 Encounter for adjustment and management of vascular access device: Secondary | ICD-10-CM | POA: Diagnosis not present

## 2024-04-15 MED ORDER — TRILACICLIB DIHYDROCHLORIDE INJECTION 300 MG
240.0000 mg/m2 | Freq: Once | INTRAVENOUS | Status: AC
Start: 2024-04-15 — End: 2024-04-15
  Administered 2024-04-15: 600 mg via INTRAVENOUS
  Filled 2024-04-15: qty 40

## 2024-04-15 MED ORDER — HEPARIN SOD (PORK) LOCK FLUSH 100 UNIT/ML IV SOLN
500.0000 [IU] | Freq: Once | INTRAVENOUS | Status: AC | PRN
Start: 2024-04-15 — End: 2024-04-15
  Administered 2024-04-15: 500 [IU]

## 2024-04-15 MED ORDER — PROCHLORPERAZINE MALEATE 10 MG PO TABS
10.0000 mg | ORAL_TABLET | Freq: Once | ORAL | Status: AC
Start: 2024-04-15 — End: 2024-04-15
  Administered 2024-04-15: 10 mg via ORAL
  Filled 2024-04-15: qty 1

## 2024-04-15 MED ORDER — SODIUM CHLORIDE 0.9 % IV SOLN
1.0000 mg/m2 | Freq: Once | INTRAVENOUS | Status: AC
  Administered 2024-04-15: 2.4 mg via INTRAVENOUS
  Filled 2024-04-15: qty 2.4

## 2024-04-15 MED ORDER — SODIUM CHLORIDE 0.9 % IV SOLN: INTRAVENOUS | Status: DC

## 2024-04-15 MED ORDER — SODIUM CHLORIDE 0.9% FLUSH
10.0000 mL | INTRAVENOUS | Status: DC | PRN
  Administered 2024-04-15: 10 mL

## 2024-04-15 NOTE — Progress Notes (Signed)
 Per Cassie PA, ok to treat with pulse 105.

## 2024-04-18 ENCOUNTER — Other Ambulatory Visit: Payer: Self-pay | Admitting: Medical Oncology

## 2024-04-18 ENCOUNTER — Inpatient Hospital Stay

## 2024-04-18 ENCOUNTER — Ambulatory Visit
Admission: RE | Admit: 2024-04-18 | Discharge: 2024-04-18 | Disposition: A | Discharge status: Home / Self Care | Payer: PRIVATE HEALTH INSURANCE | Source: Ambulatory Visit | Attending: Radiation Oncology | Admitting: Radiation Oncology

## 2024-04-18 ENCOUNTER — Ambulatory Visit
Admission: RE | Admit: 2024-04-18 | Discharge: 2024-04-18 | Disposition: A | Discharge status: Home / Self Care | Source: Ambulatory Visit | Attending: Radiation Oncology | Admitting: Radiation Oncology

## 2024-04-18 ENCOUNTER — Encounter: Payer: Self-pay | Admitting: Medical Oncology

## 2024-04-18 ENCOUNTER — Inpatient Hospital Stay: Payer: Medicare Other

## 2024-04-18 ENCOUNTER — Other Ambulatory Visit

## 2024-04-18 VITALS — BP 120/77 | HR 99 | Temp 97.3°F | Resp 18 | Ht 69.0 in | Wt 255.4 lb

## 2024-04-18 DIAGNOSIS — C787 Secondary malignant neoplasm of liver and intrahepatic bile duct: Secondary | ICD-10-CM | POA: Diagnosis not present

## 2024-04-18 DIAGNOSIS — D649 Anemia, unspecified: Secondary | ICD-10-CM

## 2024-04-18 DIAGNOSIS — Z510 Encounter for antineoplastic radiation therapy: Secondary | ICD-10-CM | POA: Diagnosis not present

## 2024-04-18 DIAGNOSIS — D6481 Anemia due to antineoplastic chemotherapy: Secondary | ICD-10-CM | POA: Diagnosis not present

## 2024-04-18 DIAGNOSIS — C3411 Malignant neoplasm of upper lobe, right bronchus or lung: Secondary | ICD-10-CM | POA: Diagnosis not present

## 2024-04-18 DIAGNOSIS — C772 Secondary and unspecified malignant neoplasm of intra-abdominal lymph nodes: Secondary | ICD-10-CM | POA: Diagnosis not present

## 2024-04-18 DIAGNOSIS — G893 Neoplasm related pain (acute) (chronic): Secondary | ICD-10-CM

## 2024-04-18 DIAGNOSIS — Z87891 Personal history of nicotine dependence: Secondary | ICD-10-CM | POA: Diagnosis not present

## 2024-04-18 DIAGNOSIS — Z452 Encounter for adjustment and management of vascular access device: Secondary | ICD-10-CM | POA: Diagnosis not present

## 2024-04-18 DIAGNOSIS — Z5111 Encounter for antineoplastic chemotherapy: Secondary | ICD-10-CM | POA: Diagnosis not present

## 2024-04-18 LAB — SAMPLE TO BLOOD BANK

## 2024-04-18 LAB — CMP (CANCER CENTER ONLY)
ALT: 14 U/L (ref 0–44)
AST: 25 U/L (ref 15–41)
Albumin: 3.5 g/dL (ref 3.5–5.0)
Alkaline Phosphatase: 85 U/L (ref 38–126)
Anion gap: 6 (ref 5–15)
BUN: 12 mg/dL (ref 8–23)
CO2: 26 mmol/L (ref 22–32)
Calcium: 8.4 mg/dL — ABNORMAL LOW (ref 8.9–10.3)
Chloride: 106 mmol/L (ref 98–111)
Creatinine: 0.95 mg/dL (ref 0.61–1.24)
GFR, Estimated: >60 mL/min (ref >60)
Glucose, Bld: 132 mg/dL — ABNORMAL HIGH (ref 70–99)
Potassium: 4.6 mmol/L (ref 3.5–5.1)
Sodium: 138 mmol/L (ref 135–145)
Total Bilirubin: 0.4 mg/dL (ref 0.0–1.2)
Total Protein: 5.7 g/dL — ABNORMAL LOW (ref 6.5–8.1)

## 2024-04-18 LAB — CBC WITH DIFFERENTIAL (CANCER CENTER ONLY)
Abs Immature Granulocytes: 0.02 10*3/uL (ref 0.00–0.07)
Basophils Absolute: 0 10*3/uL (ref 0.0–0.1)
Basophils Relative: 1 %
Eosinophils Absolute: 0.1 10*3/uL (ref 0.0–0.5)
Eosinophils Relative: 3 %
HCT: 25 % — ABNORMAL LOW (ref 39.0–52.0)
Hemoglobin: 7.8 g/dL — ABNORMAL LOW (ref 13.0–17.0)
Immature Granulocytes: 1 %
Lymphocytes Relative: 14 %
Lymphs Abs: 0.3 10*3/uL — ABNORMAL LOW (ref 0.7–4.0)
MCH: 31.2 pg (ref 26.0–34.0)
MCHC: 31.2 g/dL (ref 30.0–36.0)
MCV: 100 fL (ref 80.0–100.0)
Monocytes Absolute: 0.1 10*3/uL (ref 0.1–1.0)
Monocytes Relative: 3 %
Neutro Abs: 1.7 10*3/uL (ref 1.7–7.7)
Neutrophils Relative %: 78 %
Platelet Count: 158 10*3/uL (ref 150–400)
RBC: 2.5 MIL/uL — ABNORMAL LOW (ref 4.22–5.81)
RDW: 18.1 % — ABNORMAL HIGH (ref 11.5–15.5)
Smear Review: NORMAL
WBC Count: 2.1 10*3/uL — ABNORMAL LOW (ref 4.0–10.5)
nRBC: 0 % (ref 0.0–0.2)

## 2024-04-18 LAB — PREPARE RBC (CROSSMATCH)

## 2024-04-18 MED ORDER — SODIUM CHLORIDE 0.9% FLUSH
10.0000 mL | INTRAVENOUS | Status: DC | PRN
Start: 2024-04-18 — End: 2024-04-19
  Administered 2024-04-18: 10 mL via INTRAVENOUS

## 2024-04-18 MED ORDER — HEPARIN NA (PORK) LOCK FLSH PF 10 UNIT/ML IV SOLN
10.0000 [IU] | Freq: Once | INTRAVENOUS | Status: DC
Start: 2024-04-18 — End: 2024-04-18

## 2024-04-18 MED ORDER — HEPARIN SOD (PORK) LOCK FLUSH 100 UNIT/ML IV SOLN
500.0000 [IU] | Freq: Once | INTRAVENOUS | Status: AC
  Administered 2024-04-18: 500 [IU] via INTRAVENOUS

## 2024-04-18 NOTE — Progress Notes (Signed)
 Type and screen reentered.

## 2024-04-18 NOTE — Progress Notes (Signed)
Blood transfusion orders entered.

## 2024-04-19 ENCOUNTER — Inpatient Hospital Stay

## 2024-04-19 ENCOUNTER — Other Ambulatory Visit: Payer: Self-pay | Admitting: Medical Oncology

## 2024-04-19 ENCOUNTER — Encounter: Payer: Self-pay | Admitting: Internal Medicine

## 2024-04-19 ENCOUNTER — Telehealth: Payer: Self-pay | Admitting: Medical Oncology

## 2024-04-19 DIAGNOSIS — D649 Anemia, unspecified: Secondary | ICD-10-CM

## 2024-04-19 LAB — TYPE AND SCREEN
ABO/RH(D): B POS
Antibody Screen: NEGATIVE
Unit division: 0

## 2024-04-19 LAB — BPAM RBC
Blood Product Expiration Date: 202506042359
Unit Type and Rh: 7300

## 2024-04-19 NOTE — Progress Notes (Signed)
 Blood transfusion rescheduled for Friday 04/22/2024 . BB Orders placed ,but not released.

## 2024-04-19 NOTE — Telephone Encounter (Signed)
 Pt not notified of appt today .

## 2024-04-19 NOTE — Telephone Encounter (Signed)
 Blood transfusion orders entered and pt 's wife notified of appt on Friday . Adam Wise

## 2024-04-20 ENCOUNTER — Encounter: Payer: Self-pay | Admitting: Internal Medicine

## 2024-04-20 ENCOUNTER — Encounter: Payer: Self-pay | Admitting: Medical Oncology

## 2024-04-20 ENCOUNTER — Other Ambulatory Visit: Payer: Self-pay | Admitting: Physician Assistant

## 2024-04-20 DIAGNOSIS — C3411 Malignant neoplasm of upper lobe, right bronchus or lung: Secondary | ICD-10-CM

## 2024-04-21 ENCOUNTER — Other Ambulatory Visit: Payer: Self-pay | Admitting: Physician Assistant

## 2024-04-21 DIAGNOSIS — C3411 Malignant neoplasm of upper lobe, right bronchus or lung: Secondary | ICD-10-CM

## 2024-04-22 ENCOUNTER — Inpatient Hospital Stay

## 2024-04-22 ENCOUNTER — Other Ambulatory Visit: Payer: Self-pay | Admitting: Physician Assistant

## 2024-04-22 ENCOUNTER — Other Ambulatory Visit: Payer: Self-pay

## 2024-04-22 DIAGNOSIS — Z510 Encounter for antineoplastic radiation therapy: Secondary | ICD-10-CM | POA: Diagnosis not present

## 2024-04-22 DIAGNOSIS — Z452 Encounter for adjustment and management of vascular access device: Secondary | ICD-10-CM | POA: Diagnosis not present

## 2024-04-22 DIAGNOSIS — C3411 Malignant neoplasm of upper lobe, right bronchus or lung: Secondary | ICD-10-CM

## 2024-04-22 DIAGNOSIS — D6481 Anemia due to antineoplastic chemotherapy: Secondary | ICD-10-CM | POA: Diagnosis not present

## 2024-04-22 DIAGNOSIS — C772 Secondary and unspecified malignant neoplasm of intra-abdominal lymph nodes: Secondary | ICD-10-CM | POA: Diagnosis not present

## 2024-04-22 DIAGNOSIS — D649 Anemia, unspecified: Secondary | ICD-10-CM

## 2024-04-22 DIAGNOSIS — Z5111 Encounter for antineoplastic chemotherapy: Secondary | ICD-10-CM | POA: Diagnosis not present

## 2024-04-22 LAB — CBC WITH DIFFERENTIAL (CANCER CENTER ONLY)
Abs Immature Granulocytes: 0.01 10*3/uL (ref 0.00–0.07)
Basophils Absolute: 0 10*3/uL (ref 0.0–0.1)
Basophils Relative: 2 %
Eosinophils Absolute: 0 10*3/uL (ref 0.0–0.5)
Eosinophils Relative: 2 %
HCT: 22.7 % — ABNORMAL LOW (ref 39.0–52.0)
Hemoglobin: 7.2 g/dL — ABNORMAL LOW (ref 13.0–17.0)
Immature Granulocytes: 1 %
Lymphocytes Relative: 34 %
Lymphs Abs: 0.4 10*3/uL — ABNORMAL LOW (ref 0.7–4.0)
MCH: 31.4 pg (ref 26.0–34.0)
MCHC: 31.7 g/dL (ref 30.0–36.0)
MCV: 99.1 fL (ref 80.0–100.0)
Monocytes Absolute: 0.1 10*3/uL (ref 0.1–1.0)
Monocytes Relative: 9 %
Neutro Abs: 0.6 10*3/uL — ABNORMAL LOW (ref 1.7–7.7)
Neutrophils Relative %: 52 %
Platelet Count: 65 10*3/uL — ABNORMAL LOW (ref 150–400)
RBC: 2.29 MIL/uL — ABNORMAL LOW (ref 4.22–5.81)
RDW: 17.3 % — ABNORMAL HIGH (ref 11.5–15.5)
Smear Review: NORMAL
WBC Count: 1.2 10*3/uL — ABNORMAL LOW (ref 4.0–10.5)
nRBC: 0 % (ref 0.0–0.2)

## 2024-04-22 LAB — CMP (CANCER CENTER ONLY)
ALT: 14 U/L (ref 0–44)
AST: 21 U/L (ref 15–41)
Albumin: 3.4 g/dL — ABNORMAL LOW (ref 3.5–5.0)
Alkaline Phosphatase: 85 U/L (ref 38–126)
Anion gap: 6 (ref 5–15)
BUN: 12 mg/dL (ref 8–23)
CO2: 29 mmol/L (ref 22–32)
Calcium: 7.9 mg/dL — ABNORMAL LOW (ref 8.9–10.3)
Chloride: 107 mmol/L (ref 98–111)
Creatinine: 1.03 mg/dL (ref 0.61–1.24)
GFR, Estimated: >60 mL/min (ref >60)
Glucose, Bld: 137 mg/dL — ABNORMAL HIGH (ref 70–99)
Potassium: 3.8 mmol/L (ref 3.5–5.1)
Sodium: 142 mmol/L (ref 135–145)
Total Bilirubin: 0.3 mg/dL (ref 0.0–1.2)
Total Protein: 5.6 g/dL — ABNORMAL LOW (ref 6.5–8.1)

## 2024-04-22 LAB — PREPARE RBC (CROSSMATCH)

## 2024-04-22 LAB — SAMPLE TO BLOOD BANK

## 2024-04-22 MED ORDER — SODIUM CHLORIDE 0.9% FLUSH
10.0000 mL | INTRAVENOUS | Status: AC | PRN
Start: 2024-04-22 — End: 2024-04-22
  Administered 2024-04-22: 10 mL

## 2024-04-22 MED ORDER — ACETAMINOPHEN 325 MG PO TABS
650.0000 mg | ORAL_TABLET | Freq: Once | ORAL | Status: AC
  Administered 2024-04-22: 650 mg via ORAL
  Filled 2024-04-22: qty 2

## 2024-04-22 MED ORDER — CETIRIZINE HCL 10 MG PO TABS
10.0000 mg | ORAL_TABLET | Freq: Every day | ORAL | Status: DC
  Administered 2024-04-22: 10 mg via ORAL
  Filled 2024-04-22: qty 1

## 2024-04-22 MED ORDER — SODIUM CHLORIDE 0.9% IV SOLUTION
250.0000 mL | INTRAVENOUS | Status: DC
Start: 2024-04-22 — End: 2024-04-22
  Administered 2024-04-22: 100 mL via INTRAVENOUS

## 2024-04-22 MED ORDER — DIPHENHYDRAMINE HCL 25 MG PO CAPS: 25.0000 mg | ORAL_CAPSULE | Freq: Once | ORAL | Status: DC

## 2024-04-22 MED ORDER — HEPARIN SOD (PORK) LOCK FLUSH 100 UNIT/ML IV SOLN
500.0000 [IU] | Freq: Every day | INTRAVENOUS | Status: AC | PRN
  Administered 2024-04-22: 500 [IU]

## 2024-04-22 NOTE — Patient Instructions (Signed)

## 2024-04-22 NOTE — Progress Notes (Signed)
 Spoke with Tresa Endo in the blood bank and confirmed blood orders.

## 2024-04-23 ENCOUNTER — Other Ambulatory Visit: Payer: Self-pay

## 2024-04-23 NOTE — Progress Notes (Signed)
 ERX updated to new current topotecan  [id number]  Augie Bliss, PharmD

## 2024-04-25 ENCOUNTER — Encounter: Payer: Self-pay | Admitting: Internal Medicine

## 2024-04-25 ENCOUNTER — Inpatient Hospital Stay

## 2024-04-25 ENCOUNTER — Inpatient Hospital Stay: Payer: Medicare Other

## 2024-04-25 DIAGNOSIS — C3411 Malignant neoplasm of upper lobe, right bronchus or lung: Secondary | ICD-10-CM | POA: Diagnosis not present

## 2024-04-25 DIAGNOSIS — D6481 Anemia due to antineoplastic chemotherapy: Secondary | ICD-10-CM | POA: Diagnosis not present

## 2024-04-25 DIAGNOSIS — Z510 Encounter for antineoplastic radiation therapy: Secondary | ICD-10-CM | POA: Diagnosis not present

## 2024-04-25 DIAGNOSIS — Z5111 Encounter for antineoplastic chemotherapy: Secondary | ICD-10-CM | POA: Diagnosis not present

## 2024-04-25 DIAGNOSIS — Z452 Encounter for adjustment and management of vascular access device: Secondary | ICD-10-CM | POA: Diagnosis not present

## 2024-04-25 DIAGNOSIS — C772 Secondary and unspecified malignant neoplasm of intra-abdominal lymph nodes: Secondary | ICD-10-CM | POA: Diagnosis not present

## 2024-04-25 LAB — CMP (CANCER CENTER ONLY)
ALT: 14 U/L (ref 0–44)
AST: 22 U/L (ref 15–41)
Albumin: 3.5 g/dL (ref 3.5–5.0)
Alkaline Phosphatase: 86 U/L (ref 38–126)
Anion gap: 6 (ref 5–15)
BUN: 11 mg/dL (ref 8–23)
CO2: 29 mmol/L (ref 22–32)
Calcium: 8.2 mg/dL — ABNORMAL LOW (ref 8.9–10.3)
Chloride: 104 mmol/L (ref 98–111)
Creatinine: 1.09 mg/dL (ref 0.61–1.24)
GFR, Estimated: >60 mL/min
Glucose, Bld: 153 mg/dL — ABNORMAL HIGH (ref 70–99)
Potassium: 4.4 mmol/L (ref 3.5–5.1)
Sodium: 139 mmol/L (ref 135–145)
Total Bilirubin: 0.3 mg/dL (ref 0.0–1.2)
Total Protein: 5.7 g/dL — ABNORMAL LOW (ref 6.5–8.1)

## 2024-04-25 LAB — CBC WITH DIFFERENTIAL (CANCER CENTER ONLY)
Abs Immature Granulocytes: 0.01 10*3/uL (ref 0.00–0.07)
Basophils Absolute: 0 10*3/uL (ref 0.0–0.1)
Basophils Relative: 1 %
Eosinophils Absolute: 0 10*3/uL (ref 0.0–0.5)
Eosinophils Relative: 1 %
HCT: 26.8 % — ABNORMAL LOW (ref 39.0–52.0)
Hemoglobin: 8.7 g/dL — ABNORMAL LOW (ref 13.0–17.0)
Immature Granulocytes: 1 %
Lymphocytes Relative: 21 %
Lymphs Abs: 0.4 10*3/uL — ABNORMAL LOW (ref 0.7–4.0)
MCH: 32.3 pg (ref 26.0–34.0)
MCHC: 32.5 g/dL (ref 30.0–36.0)
MCV: 99.6 fL (ref 80.0–100.0)
Monocytes Absolute: 0.3 10*3/uL (ref 0.1–1.0)
Monocytes Relative: 16 %
Neutro Abs: 1.2 10*3/uL — ABNORMAL LOW (ref 1.7–7.7)
Neutrophils Relative %: 60 %
Platelet Count: 42 10*3/uL — ABNORMAL LOW (ref 150–400)
RBC: 2.69 MIL/uL — ABNORMAL LOW (ref 4.22–5.81)
RDW: 17.5 % — ABNORMAL HIGH (ref 11.5–15.5)
WBC Count: 2 10*3/uL — ABNORMAL LOW (ref 4.0–10.5)
nRBC: 0 % (ref 0.0–0.2)

## 2024-04-25 LAB — SAMPLE TO BLOOD BANK

## 2024-04-25 LAB — BPAM RBC
Blood Product Expiration Date: 202506062359
ISSUE DATE / TIME: 202505091257
Unit Type and Rh: 7300

## 2024-04-25 LAB — TYPE AND SCREEN
ABO/RH(D): B POS
Antibody Screen: NEGATIVE
Unit division: 0

## 2024-04-26 ENCOUNTER — Emergency Department (HOSPITAL_COMMUNITY)

## 2024-04-26 ENCOUNTER — Other Ambulatory Visit: Payer: Self-pay

## 2024-04-26 ENCOUNTER — Emergency Department (HOSPITAL_COMMUNITY)
Admission: EM | Admit: 2024-04-26 | Discharge: 2024-04-26 | Disposition: A | Discharge status: Home / Self Care | Attending: Emergency Medicine | Admitting: Emergency Medicine

## 2024-04-26 ENCOUNTER — Other Ambulatory Visit: Payer: Self-pay | Admitting: Nurse Practitioner

## 2024-04-26 DIAGNOSIS — R0902 Hypoxemia: Secondary | ICD-10-CM | POA: Diagnosis not present

## 2024-04-26 DIAGNOSIS — M792 Neuralgia and neuritis, unspecified: Secondary | ICD-10-CM

## 2024-04-26 DIAGNOSIS — Z85118 Personal history of other malignant neoplasm of bronchus and lung: Secondary | ICD-10-CM | POA: Insufficient documentation

## 2024-04-26 DIAGNOSIS — N400 Enlarged prostate without lower urinary tract symptoms: Secondary | ICD-10-CM | POA: Insufficient documentation

## 2024-04-26 DIAGNOSIS — R937 Abnormal findings on diagnostic imaging of other parts of musculoskeletal system: Secondary | ICD-10-CM | POA: Diagnosis not present

## 2024-04-26 DIAGNOSIS — R600 Localized edema: Secondary | ICD-10-CM | POA: Diagnosis not present

## 2024-04-26 DIAGNOSIS — Z794 Long term (current) use of insulin: Secondary | ICD-10-CM | POA: Insufficient documentation

## 2024-04-26 DIAGNOSIS — I700 Atherosclerosis of aorta: Secondary | ICD-10-CM | POA: Diagnosis not present

## 2024-04-26 DIAGNOSIS — I10 Essential (primary) hypertension: Secondary | ICD-10-CM | POA: Diagnosis not present

## 2024-04-26 DIAGNOSIS — Z7984 Long term (current) use of oral hypoglycemic drugs: Secondary | ICD-10-CM | POA: Insufficient documentation

## 2024-04-26 DIAGNOSIS — Z79899 Other long term (current) drug therapy: Secondary | ICD-10-CM | POA: Insufficient documentation

## 2024-04-26 DIAGNOSIS — R531 Weakness: Secondary | ICD-10-CM | POA: Insufficient documentation

## 2024-04-26 DIAGNOSIS — K5730 Diverticulosis of large intestine without perforation or abscess without bleeding: Secondary | ICD-10-CM | POA: Diagnosis not present

## 2024-04-26 DIAGNOSIS — C3411 Malignant neoplasm of upper lobe, right bronchus or lung: Secondary | ICD-10-CM

## 2024-04-26 DIAGNOSIS — Z7901 Long term (current) use of anticoagulants: Secondary | ICD-10-CM | POA: Diagnosis not present

## 2024-04-26 DIAGNOSIS — R197 Diarrhea, unspecified: Secondary | ICD-10-CM | POA: Diagnosis not present

## 2024-04-26 DIAGNOSIS — D649 Anemia, unspecified: Secondary | ICD-10-CM

## 2024-04-26 DIAGNOSIS — M5136 Other intervertebral disc degeneration, lumbar region with discogenic back pain only: Secondary | ICD-10-CM

## 2024-04-26 DIAGNOSIS — Z515 Encounter for palliative care: Secondary | ICD-10-CM

## 2024-04-26 DIAGNOSIS — C3490 Malignant neoplasm of unspecified part of unspecified bronchus or lung: Secondary | ICD-10-CM

## 2024-04-26 DIAGNOSIS — G893 Neoplasm related pain (acute) (chronic): Secondary | ICD-10-CM

## 2024-04-26 DIAGNOSIS — M47816 Spondylosis without myelopathy or radiculopathy, lumbar region: Secondary | ICD-10-CM | POA: Diagnosis not present

## 2024-04-26 DIAGNOSIS — M545 Low back pain, unspecified: Secondary | ICD-10-CM

## 2024-04-26 DIAGNOSIS — D61818 Other pancytopenia: Secondary | ICD-10-CM

## 2024-04-26 DIAGNOSIS — D696 Thrombocytopenia, unspecified: Secondary | ICD-10-CM

## 2024-04-26 DIAGNOSIS — M48061 Spinal stenosis, lumbar region without neurogenic claudication: Secondary | ICD-10-CM | POA: Diagnosis not present

## 2024-04-26 DIAGNOSIS — E119 Type 2 diabetes mellitus without complications: Secondary | ICD-10-CM | POA: Insufficient documentation

## 2024-04-26 DIAGNOSIS — M549 Dorsalgia, unspecified: Secondary | ICD-10-CM | POA: Diagnosis not present

## 2024-04-26 LAB — CBC
HCT: 25.6 % — ABNORMAL LOW (ref 39.0–52.0)
Hemoglobin: 8.4 g/dL — ABNORMAL LOW (ref 13.0–17.0)
MCH: 33.3 pg (ref 26.0–34.0)
MCHC: 32.8 g/dL (ref 30.0–36.0)
MCV: 101.6 fL — ABNORMAL HIGH (ref 80.0–100.0)
Platelets: 45 10*3/uL — ABNORMAL LOW (ref 150–400)
RBC: 2.52 MIL/uL — ABNORMAL LOW (ref 4.22–5.81)
RDW: 17.9 % — ABNORMAL HIGH (ref 11.5–15.5)
WBC: 3.2 10*3/uL — ABNORMAL LOW (ref 4.0–10.5)
nRBC: 0 % (ref 0.0–0.2)

## 2024-04-26 LAB — URINALYSIS, ROUTINE W REFLEX MICROSCOPIC
Bilirubin Urine: NEGATIVE
Glucose, UA: NEGATIVE mg/dL
Hgb urine dipstick: NEGATIVE
Ketones, ur: 5 mg/dL — AB
Leukocytes,Ua: NEGATIVE
Nitrite: NEGATIVE
Protein, ur: NEGATIVE mg/dL
Specific Gravity, Urine: 1.015 (ref 1.005–1.030)
pH: 8 (ref 5.0–8.0)

## 2024-04-26 LAB — COMPREHENSIVE METABOLIC PANEL WITH GFR
ALT: 16 U/L (ref 0–44)
AST: 26 U/L (ref 15–41)
Albumin: 2.9 g/dL — ABNORMAL LOW (ref 3.5–5.0)
Alkaline Phosphatase: 81 U/L (ref 38–126)
Anion gap: 8 (ref 5–15)
BUN: 13 mg/dL (ref 8–23)
CO2: 24 mmol/L (ref 22–32)
Calcium: 7.9 mg/dL — ABNORMAL LOW (ref 8.9–10.3)
Chloride: 99 mmol/L (ref 98–111)
Creatinine, Ser: 0.99 mg/dL (ref 0.61–1.24)
GFR, Estimated: >60 mL/min (ref >60)
Glucose, Bld: 158 mg/dL — ABNORMAL HIGH (ref 70–99)
Potassium: 3.7 mmol/L (ref 3.5–5.1)
Sodium: 131 mmol/L — ABNORMAL LOW (ref 135–145)
Total Bilirubin: 0.8 mg/dL (ref 0.0–1.2)
Total Protein: 5.5 g/dL — ABNORMAL LOW (ref 6.5–8.1)

## 2024-04-26 LAB — AMMONIA: Ammonia: <13 umol/L (ref 9–35)

## 2024-04-26 LAB — I-STAT CG4 LACTIC ACID, ED: Lactic Acid, Venous: 0.7 mmol/L (ref 0.5–1.9)

## 2024-04-26 MED ORDER — MORPHINE SULFATE ER 30 MG PO TBCR
30.0000 mg | EXTENDED_RELEASE_TABLET | Freq: Three times a day (TID) | ORAL | 0 refills | Status: AC
Start: 2024-04-26 — End: ?

## 2024-04-26 MED ORDER — HYDROMORPHONE HCL 1 MG/ML IJ SOLN
1.0000 mg | Freq: Once | INTRAMUSCULAR | Status: AC
  Administered 2024-04-26: 1 mg via INTRAVENOUS
  Filled 2024-04-26: qty 1

## 2024-04-26 MED ORDER — GABAPENTIN 300 MG PO CAPS: 300.0000 mg | ORAL_CAPSULE | Freq: Two times a day (BID) | ORAL | 1 refills | Status: DC

## 2024-04-26 MED ORDER — ONDANSETRON HCL 8 MG PO TABS: 4.0000 mg | ORAL_TABLET | Freq: Three times a day (TID) | ORAL | 3 refills | Status: DC | PRN

## 2024-04-26 MED ORDER — LACTATED RINGERS IV BOLUS
500.0000 mL | Freq: Once | INTRAVENOUS | Status: AC
  Administered 2024-04-26: 500 mL via INTRAVENOUS

## 2024-04-26 NOTE — ED Notes (Signed)
 Patient transported to CT

## 2024-04-26 NOTE — ED Triage Notes (Signed)
 Patient to ED by EMS from home with c/o weakness and diarrhea x2 days. Patient has HX of lung cancer, starts chemo next week. EMS started 18g in L forearm and gave 500ml of NS. He denies SOB, N/V, has chronic back pain.  98/66 107 92% RA CBG: 200

## 2024-04-26 NOTE — ED Notes (Signed)
Patient refused rectal temp

## 2024-04-26 NOTE — ED Notes (Signed)
Pt wheeled to waiting room. Pt verbalized understanding of discharge instructions.   

## 2024-04-26 NOTE — ED Provider Notes (Signed)
 [place] EMERGENCY DEPARTMENT AT [facility] Provider Note   CSN: [id number] Arrival date & time: 04/26/24  1015     History  Chief Complaint  Patient presents with   Weakness    Adam Wise is a 73 y.o. male.  HPI 73 yo male ho small cell lung cancer with metastatic disease on palliative care presents today with complaints of weakness and back pain.  His wife states that she called EMS today because of his worsening chronic back pain.  She states that today she was unable to get him out of the bathroom.  He complained of severe pain and was unable to move.  He has been taking morphine  30 mg twice daily and oxycodone  10 mg for breakthrough pain.  He has had a dose of that today.  She states he has been generally weak.  He was not complaining of anything on my initial exam.  He is pale.  He has been getting regular blood transfusions.  He denies any fever, chills, shortness of breath, nausea, vomiting.  His wife thinks that he is dehydrated he has not been drinking much in the way of fluids and has had decreased urine output.     Home Medications Prior to Admission medications   Medication Sig Start Date End Date Taking? Authorizing Provider  albuterol  (VENTOLIN  HFA) 108 (90 Base) MCG/ACT inhaler INHALE 2 PUFFS INTO THE LUNGS EVERY 4 (FOUR) HOURS AS NEEDED FOR SHORTNESS OF BREATH 01/31/24   Gabriel John, NP  amLODipine  (NORVASC ) 10 MG tablet Take 0.5 tablets (5 mg total) by mouth daily. TAKE 1 TABLET BY MOUTH EVERY DAY for blood pressure. 01/28/24   Gabriel John, NP  B-D 3CC LUER-LOK SYR 22GX1" 22G X 1" 3 ML MISC USE AS INSTRUCTED FOR TESTOSTERONE  INJECTION EVERY 2 WEEKS 03/20/21   Clark, Katherine K, NP  Black Pepper-Turmeric (TURMERIC COMPLEX/BLACK PEPPER PO) Take 1 tablet by mouth in the morning and at bedtime.    [provider]  Coenzyme Q10 (COQ10) 200 MG CAPS Take 200 mg by mouth at bedtime.    [provider]  cyclobenzaprine  (FLEXERIL ) 5  MG tablet TAKE 1 TABLET BY MOUTH AT BEDTIME AS NEEDED FOR MUSCLE SPASMS. 07/21/23   Clark, Katherine K, NP  docusate sodium  (COLACE) 100 MG capsule Take 1 capsule (100 mg total) by mouth every 12 (twelve) hours. 08/02/23   Afton Horse T, DO  ELIQUIS  5 MG TABS tablet TAKE 1 TABLET BY MOUTH TWICE A DAY 12/06/23   Heilingoetter, Cassandra L, PA-C  esomeprazole (NEXIUM) 20 MG capsule Take 20 mg by mouth in the morning.    [provider]  gabapentin  (NEURONTIN ) 300 MG capsule TAKE 1 CAPSULE (300 MG TOTAL) BY MOUTH 2 (TWO) TIMES DAILY. FOR BACK PAIN. 10/25/23   Gabriel John, NP  glucose blood (ONETOUCH ULTRA) test strip USE UP TO 4 TIMES DAILY AS DIRECTED 07/29/22   Gabriel John, NP  Insulin  Pen Needle (BD PEN NEEDLE NANO U/F) 32G X 4 MM MISC Use with insulin  as prescribed Dx Code: E11.9 05/20/23   Gabriel John, NP  ipratropium-albuterol  (DUONEB) 0.5-2.5 (3) MG/3ML SOLN Take 3 mLs by nebulization every 4 (four) hours while awake for 3 days, THEN 3 mLs every 4 (four) hours as needed (shortness of breath or wheezing). 01/07/24 02/09/24  Clark, Katherine K, NP  KRILL OIL PO Take 1 capsule by mouth daily.    [provider]  Lancets MISC USE UP TO 3  TIMES DAILY AS DIRECTED 05/06/18   Clark, Katherine K, NP  lidocaine  (LIDODERM ) 5 % Place 1 patch onto the skin daily. Remove & Discard patch within 12 hours or as directed by MD 05/19/23   Kommor, Alyse July, MD  losartan  (COZAAR ) 50 MG tablet TAKE 1 TABLET (50 MG TOTAL) BY MOUTH DAILY. FOR BLOOD PRESSURE. 08/20/23   Clark, Katherine K, NP  Melatonin 10 MG CAPS Take 10 mg by mouth at bedtime as needed (for sleep).    [provider]  metFORMIN  (GLUCOPHAGE ) 1000 MG tablet TAKE 1 TABLET (1,000 MG TOTAL) BY MOUTH 2 (TWO) TIMES DAILY WITH A MEAL. FOR DIABETES 01/29/24   Clark, Katherine K, NP  mirtazapine  (REMERON ) 15 MG tablet TAKE 1 TABLET BY MOUTH EVERYDAY AT BEDTIME 02/28/24   Heilingoetter, Cassandra L, PA-C  morphine  (MS CONTIN ) 30  MG 12 hr tablet Take 1 tablet (30 mg total) by mouth 2 (two) times daily. 03/25/24   Pickenpack-Cousar, Athena N, NP  Multiple Vitamin (MULTI-VITAMINS) TABS Take 1 tablet by mouth daily with breakfast.    [provider]  Naloxone  HCl 3 MG/0.1ML LIQD Place 1 spray into both nostrils once. For known/suspected opiod overdose. Every 2-3 minutes in alternating nostril till EMS arrives. 10/06/23   [provider]  nystatin  (MYCOSTATIN ) 100000 UNIT/ML suspension Take 5 mLs (500,000 Units total) by mouth 4 (four) times daily. 02/29/24   Heilingoetter, Cassandra L, PA-C  ondansetron  (ZOFRAN ) 4 MG tablet Take 1 tablet (4 mg total) by mouth every 8 (eight) hours as needed for nausea or vomiting. 01/18/24   Pickenpack-Cousar, Athena N, NP  oxyCODONE -acetaminophen  (PERCOCET) 10-325 MG tablet Take 1 tablet by mouth every 4 (four) hours as needed. 03/25/24   Pickenpack-Cousar, Athena N, NP  pioglitazone  (ACTOS ) 45 MG tablet TAKE 1 TABLET (45 MG TOTAL) BY MOUTH DAILY. FOR DIABETES. 02/14/24   Clark, Katherine K, NP  polyethylene glycol powder (GLYCOLAX /MIRALAX ) 17 GM/SCOOP powder Take by mouth once.    [provider]  pravastatin  (PRAVACHOL ) 40 MG tablet TAKE 1 TABLET BY MOUTH EVERY DAY IN THE EVENING FOR CHOLESTEROL 08/21/23   Clark, Katherine K, NP  PRESCRIPTION MEDICATION CPAP- At bedtime    [provider]  testosterone  cypionate (DEPOTESTOSTERONE CYPIONATE) 200 MG/ML injection Inject 200 mg into the muscle every 14 (fourteen) days.    [provider]      Allergies    Bupropion and Hydrochlorothiazide    Review of Systems   Review of Systems  Physical Exam Updated Vital Signs BP 124/79 (BP Location: Left Arm)   Pulse 100   Temp 98.4 F (36.9 C) (Oral)   Resp 20   Ht 1.753 m (5\' 9" )   Wt 83.9 kg   SpO2 94%   BMI 27.32 kg/m  Physical Exam Vitals and nursing note reviewed.  Constitutional:      General: He is not in acute distress.    Appearance: Normal  appearance. He is ill-appearing.  HENT:     Head: Normocephalic.     Right Ear: External ear normal.     Left Ear: External ear normal.     Nose: Nose normal.     Mouth/Throat:     Mouth: Mucous membranes are dry.  Eyes:     Comments: Conjunctivae are pale  Cardiovascular:     Rate and Rhythm: Normal rate.     Pulses: Normal pulses.  Pulmonary:     Effort: Pulmonary effort is normal.     Breath sounds: Normal breath  sounds.  Abdominal:     General: Bowel sounds are normal.     Palpations: Abdomen is soft.  Musculoskeletal:     Cervical back: Normal range of motion.     Right lower leg: Edema present.     Left lower leg: Edema present.  Skin:    General: Skin is warm.     Capillary Refill: Capillary refill takes less than 2 seconds.     Coloration: Skin is pale.  Neurological:     General: No focal deficit present.     Mental Status: He is alert.  Psychiatric:        Mood and Affect: Mood normal.     ED Results / Procedures / Treatments   Labs (all labs ordered are listed, but only abnormal results are displayed) Labs Reviewed  COMPREHENSIVE METABOLIC PANEL WITH GFR - Abnormal; Notable for the following components:      Result Value   Sodium 131 (*)    Glucose, Bld 158 (*)    Calcium 7.9 (*)    Total Protein 5.5 (*)    Albumin 2.9 (*)    All other components within normal limits  CBC - Abnormal; Notable for the following components:   WBC 3.2 (*)    RBC 2.52 (*)    Hemoglobin 8.4 (*)    HCT 25.6 (*)    MCV 101.6 (*)    RDW 17.9 (*)    Platelets 45 (*)    All other components within normal limits  CULTURE, BLOOD (ROUTINE X 2)  CULTURE, BLOOD (ROUTINE X 2)  AMMONIA  URINALYSIS, ROUTINE W REFLEX MICROSCOPIC  I-STAT CG4 LACTIC ACID, ED  I-STAT CG4 LACTIC ACID, ED    EKG None  Radiology No results found.  Procedures Procedures    Medications Ordered in ED Medications - No data to display  ED Course/ Medical Decision Making/ A&P                                  Medical Decision Making Amount and/or Complexity of Data Reviewed Labs: ordered. Radiology: ordered.  Risk Prescription drug management.   73 year old man presents today complaining of back pain, generalized weakness CT ordered of low back but patient refused. Rectal temp ordered patient refused CT obtained that shows degenerative changes of the lumbar spine without any obvious destructive lesion.  There is abnormal soft tissue locules of gas involving the right neural foramen at L3-L4 concerning for large disc extrusion there is associated severe foraminal stenosis at L3-L4.  Palliative care is being consulted  Discussed with palliative and advised the patient is still having aggressive management of cancer. Discussed with patient and wife.  They state that they would like to follow-up with the [facility] for spine outpatient had previously been referred to [facility] but did not wish to go to [place]. Neurosurgery paged.  Patient given Dilaudid  1 IV IV fluids infusing and plan discharge home after       Final Clinical Impression(s) / ED Diagnoses Final diagnoses:  None    Rx / DC Orders ED Discharge Orders     None         Auston Blush, MD 04/26/24 1610

## 2024-04-26 NOTE — Discharge Instructions (Addendum)
 It was our pleasure to provide your ER care today - we hope that you feel better.  Avoid bending at waist or heavy lifting > 20 lbs for the next week.  Try gentle massage and/or heat  therapy to sore area. Take your pain medication as need.   Follow up closely with neurosurgeon in their office - call office tomorrow AM to arrange appointment time.   Return to ER right away if worse, new symptoms, fevers, severe/intractable pain, new numbness/weakness, willing to be admitted, or other concern.    You were given pain meds in the ER - no driving for the next 6 hours or if/when taking opiate-type pain meds.

## 2024-04-26 NOTE — ED Provider Notes (Addendum)
 Signed out that CT with unusual reading and that neurosurgery is calling back, and that patient is requesting discharge, does not want to stay.  Neurosurgery has reviewed CT, and indicates they would recommend admission, MRI, and they will see in AM.   Discussed CT reading with patient again, and discussed NS recommendation, and discussed concern for possible infection/disciitis/osteo, and recommended MRI and admission. Pt/spouse indicates he is not willing to stay in hospital, indicates he wants to be discharged. Discussed risks, incl worsening infection, sepsis/severe infection, intractable pain, potential permanent loss of neurologic function, etc.  Pt denies any fever,chills, sweats, states feels same as his chronic pain, and indicates despite concern for possible infection, he is not willing to remain in hospital. He indicates he will return if worse, if fever, worsening pain, new numbness/weakness, or any worsening. He also indicates is willing to follow up as outpatient, but not willing to be admitted and is not willing to stay for MRI.  Declines any additional pain meds, indicates just wants d/c.  Also discussed w pts palliative med physician, Dr Glady Laming, incl pt not willing to stay - she will f/u closely as outpatient as well.   Return precautions provided.        Guadalupe Lee, MD 04/26/24 [phone]

## 2024-04-27 ENCOUNTER — Encounter: Payer: Self-pay | Admitting: Internal Medicine

## 2024-04-27 ENCOUNTER — Other Ambulatory Visit: Payer: Self-pay | Admitting: Physician Assistant

## 2024-04-27 ENCOUNTER — Telehealth: Payer: Self-pay | Admitting: Physician Assistant

## 2024-04-27 DIAGNOSIS — C3411 Malignant neoplasm of upper lobe, right bronchus or lung: Secondary | ICD-10-CM

## 2024-04-27 NOTE — Telephone Encounter (Signed)
 I reviewed the patient's schedule with Dr. Marguerita Shih.  His last day radiation is 05/16/2024.  Dr. Marguerita Shih would like to start the patient's chemotherapy back the week after he finishes radiation on 05/23/2024.  The patient was scheduled for an appointment on 05/02/2024 that was an old appointment when he was due for treatment that week.  Therefore I called the patient's wife to let them know that Dr. Marguerita Shih recommends rescheduling that appointment until after he completes radiation.  Therefore I have canceled the appointment on 05/02/2024.  The patient is supposed to see palliative care for a emergency room follow-up visit.  They have asked that I reach out to the palliative team to see if this can be scheduled as a virtual visit.  I have sent a message.  I have also sent a message to reschedule his chemotherapy and follow-up in early June.  His wife expressed understanding with the plan.

## 2024-04-27 NOTE — Consult Note (Signed)
 Palliative Care Consult Note                                  Date: 04/27/2024   Patient Name: Adam Wise  DOB: [birth year]  MRN: [id number]  Age / Sex: 73 y.o., male  PCP: Heilingoetter, Leita Purdue, PA-C Referring Physician: No att. providers found  Reason for Consultation: Establishing goals of care, Non pain symptom management, and Pain control  HPI/Patient Profile: Palliative Care consult requested for goals of care discussion in this 73 y.o. 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 Patient declining hospital admission. Seen in ED prior to discharge.    Past Medical History:  Diagnosis Date   Acute on chronic respiratory failure with hypoxia (HCC) 10/12/2021   Chickenpox    Chronic knee pain    Chronic low back pain    COPD exacerbation (HCC) 10/11/2021   COPD with exacerbation (HCC) 10/12/2021   Coronary artery calcification seen on CAT scan 11/2021   Coronary CTA 11/27/2021: Moderate to large right pleural effusion and compressive atelectasis right lung base. => Coronary Calcium Score 657.  Diffuse RCA calcification.  Minimal mild disease in the LAD and diagonal branches. == Overall limited study.  Notable artifact.   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 He has disease progression after the first line of chemotherapy in December 2021.   Testosterone  deficiency    Type 2 diabetes mellitus (HCC)       Subjective:   This NP Gardenia Jump reviewed medical records, received report from team, assessed the patient and then met at the patient's bedside with Adam Wise as his wife to discuss diagnosis, prognosis, GOC, EOL wishes disposition and options.   Patient is familiar to myself and palliative team. I am actively following at the cancer center for pain and symptom management.   I created space and opportunity for patient and family to explore state of health prior to admission, thoughts, and feelings.   Adam Wise reports increased in lower back pain over the past 2 days. His wife shares patient has been unable to care for himself and was observed yelling out in pain causing her to seek medical attention given home regimen was not controlling his symptoms.   Adam Wise reports he is feeling better after receiving IV pain medications and has no interest in being admitted. Patient is adamant about discharging home expressing wishes to follow-up outpatient and awareness that he is scheduled to be seen by radiation for new pain and lesions.   We discussed his pain regimen of MS Contin  30 mg every 12 hours. We will increase to every 8 hours. Will also increase gabapentin  600mg  three times a day. Patient verbalized understanding and will pick up medications. We will plan for close follow-up outpatient.   We discussed His current illness and what it means in the larger context of His on-going co-morbidities. Natural disease trajectory and expectations were  discussed.  I discussed the importance of continued conversation with family and their medical providers regarding overall plan of care and treatment options, ensuring decisions are within the context of the patients values and GOCs.  Questions and concerns were addressed. The patient and family was encouraged to call with questions or concerns.  PMT will continue to support holistically as needed.  Objective:    Allergies  Allergen Reactions    Bupropion Other (See Comments)    Racing heart   Hydrochlorothiazide Other (See Comments)    Cramping to lower extremities    Review of Systems  Musculoskeletal:  Positive for arthralgias and back pain.   Unless otherwise noted, a complete review of systems is negative.  Physical Exam General: NAD, frail chronically-ill appearing Cardiovascular: regular rate and rhythm Pulmonary: clear ant fields, diminished bilaterally  Abdomen: soft, nontender, + bowel sounds Extremities: no edema, no joint deformities Skin: no rashes, warm and dry Neurological:   Vital Signs:  BP 110/71   Pulse 94   Temp 98.8 F (37.1 C)   Resp (!) 22   Ht 5\' 9"  (1.753 m)   Wt 83.9 kg   SpO2 90%   BMI 27.32 kg/m  Pain Scale: 0-10   Pain Score: 0-No pain  SpO2: SpO2: 90 % O2 Device:SpO2: 90 % O2 Flow Rate: .   IO: Intake/output summary:  Intake/Output Summary (Last 24 hours) at 04/27/2024 1017 Last data filed at 04/26/2024 1819 Gross per 24 hour  Intake 500 ml  Output --  Net 500 ml    LBM:   Baseline Weight: Weight: 83.9 kg Most recent weight: Weight: 83.9 kg      Palliative Assessment/Data:    Advanced Care Planning:   Primary Decision Maker: PATIENT  Code Status/Advance Care Planning: Full code   Assessment & Plan:   SUMMARY OF RECOMMENDATIONS   Patient declining admission. Would like to follow-up outpatient for pain management. Appointments provided for upcoming week at [facility].   Symptom Management:  Increase MS Contin  30mg  to every 8 hours Continue Zofran  as needed. Gabapentin  600 mg three times daily  Percocet every 4 hours   Palliative Prophylaxis:  Bowel Regimen  Additional Recommendations (Limitations, Scope, Preferences): Full Scope Treatment  Psycho-social/Spiritual:  Desire for further Chaplaincy support: no  Discharge Planning:  Home with Palliative Services at [facility]   Discussed with: EDP   Patient and wife expressed  understanding and was in agreement with this plan.    Time Total: 55 min   Visit consisted of counseling and education dealing with the complex and emotionally intense issues of symptom management and palliative care in the setting of serious and potentially life-threatening illness.  Signed by:  Dellia Ferguson, AGPCNP-BC [facility]   Phone: [phone] Pager: [phone] Amion: Cori Dials   Thank you for allowing the Palliative Medicine Team to assist in the care of this patient. Please utilize secure chat with additional questions, if there is no response within 30 minutes please call the above phone number. Palliative Medicine Team providers are available by phone from 7am to 5pm daily and can be reached through the team cell phone.  Should this patient require assistance outside of these hours, please call the patient's attending physician.

## 2024-04-29 DIAGNOSIS — Z87891 Personal history of nicotine dependence: Secondary | ICD-10-CM | POA: Diagnosis not present

## 2024-04-29 DIAGNOSIS — D6481 Anemia due to antineoplastic chemotherapy: Secondary | ICD-10-CM | POA: Diagnosis not present

## 2024-04-29 DIAGNOSIS — Z510 Encounter for antineoplastic radiation therapy: Secondary | ICD-10-CM | POA: Diagnosis not present

## 2024-04-29 DIAGNOSIS — C772 Secondary and unspecified malignant neoplasm of intra-abdominal lymph nodes: Secondary | ICD-10-CM | POA: Diagnosis not present

## 2024-04-29 DIAGNOSIS — Z452 Encounter for adjustment and management of vascular access device: Secondary | ICD-10-CM | POA: Diagnosis not present

## 2024-04-29 DIAGNOSIS — Z5111 Encounter for antineoplastic chemotherapy: Secondary | ICD-10-CM | POA: Diagnosis not present

## 2024-04-29 DIAGNOSIS — C3411 Malignant neoplasm of upper lobe, right bronchus or lung: Secondary | ICD-10-CM | POA: Diagnosis not present

## 2024-04-29 DIAGNOSIS — C787 Secondary malignant neoplasm of liver and intrahepatic bile duct: Secondary | ICD-10-CM | POA: Diagnosis not present

## 2024-05-01 LAB — CULTURE, BLOOD (ROUTINE X 2)
Culture: NO GROWTH
Culture: NO GROWTH

## 2024-05-02 ENCOUNTER — Other Ambulatory Visit

## 2024-05-02 ENCOUNTER — Ambulatory Visit: Payer: PRIVATE HEALTH INSURANCE

## 2024-05-02 ENCOUNTER — Other Ambulatory Visit: Payer: Medicare Other

## 2024-05-02 ENCOUNTER — Encounter: Payer: PRIVATE HEALTH INSURANCE | Admitting: Nurse Practitioner

## 2024-05-02 ENCOUNTER — Other Ambulatory Visit: Payer: PRIVATE HEALTH INSURANCE

## 2024-05-02 ENCOUNTER — Ambulatory Visit: Payer: Medicare Other

## 2024-05-02 ENCOUNTER — Ambulatory Visit
Admission: RE | Admit: 2024-05-02 | Discharge: 2024-05-02 | Disposition: A | Discharge status: Home / Self Care | Source: Ambulatory Visit | Attending: Radiation Oncology | Admitting: Radiation Oncology

## 2024-05-02 ENCOUNTER — Other Ambulatory Visit: Payer: Self-pay

## 2024-05-02 ENCOUNTER — Ambulatory Visit: Payer: Medicare Other | Admitting: Internal Medicine

## 2024-05-02 DIAGNOSIS — C3411 Malignant neoplasm of upper lobe, right bronchus or lung: Secondary | ICD-10-CM | POA: Diagnosis not present

## 2024-05-02 DIAGNOSIS — Z510 Encounter for antineoplastic radiation therapy: Secondary | ICD-10-CM | POA: Diagnosis not present

## 2024-05-02 DIAGNOSIS — Z5111 Encounter for antineoplastic chemotherapy: Secondary | ICD-10-CM | POA: Diagnosis not present

## 2024-05-02 DIAGNOSIS — Z452 Encounter for adjustment and management of vascular access device: Secondary | ICD-10-CM | POA: Diagnosis not present

## 2024-05-02 DIAGNOSIS — D6481 Anemia due to antineoplastic chemotherapy: Secondary | ICD-10-CM | POA: Diagnosis not present

## 2024-05-02 DIAGNOSIS — C772 Secondary and unspecified malignant neoplasm of intra-abdominal lymph nodes: Secondary | ICD-10-CM | POA: Diagnosis not present

## 2024-05-02 LAB — RAD ONC ARIA SESSION SUMMARY
Course Elapsed Days: 0
Plan Fractions Treated to Date: 1
Plan Prescribed Dose Per Fraction: 8 Gy
Plan Total Fractions Prescribed: 5
Plan Total Prescribed Dose: 40 Gy
Reference Point Dosage Given to Date: 8 Gy
Reference Point Session Dosage Given: 8 Gy
Session Number: 1

## 2024-05-03 ENCOUNTER — Ambulatory Visit: Payer: Medicare Other

## 2024-05-03 ENCOUNTER — Ambulatory Visit: Payer: PRIVATE HEALTH INSURANCE

## 2024-05-03 ENCOUNTER — Ambulatory Visit: Admitting: Radiation Oncology

## 2024-05-04 ENCOUNTER — Other Ambulatory Visit: Payer: Self-pay

## 2024-05-04 ENCOUNTER — Ambulatory Visit

## 2024-05-04 ENCOUNTER — Ambulatory Visit: Payer: Medicare Other

## 2024-05-04 ENCOUNTER — Ambulatory Visit: Payer: PRIVATE HEALTH INSURANCE | Admitting: Radiation Oncology

## 2024-05-04 ENCOUNTER — Ambulatory Visit
Admission: RE | Admit: 2024-05-04 | Discharge: 2024-05-04 | Disposition: A | Discharge status: Home / Self Care | Source: Ambulatory Visit | Attending: Radiation Oncology | Admitting: Radiation Oncology

## 2024-05-04 DIAGNOSIS — C772 Secondary and unspecified malignant neoplasm of intra-abdominal lymph nodes: Secondary | ICD-10-CM | POA: Diagnosis not present

## 2024-05-04 DIAGNOSIS — C3411 Malignant neoplasm of upper lobe, right bronchus or lung: Secondary | ICD-10-CM | POA: Diagnosis not present

## 2024-05-04 DIAGNOSIS — D6481 Anemia due to antineoplastic chemotherapy: Secondary | ICD-10-CM | POA: Diagnosis not present

## 2024-05-04 DIAGNOSIS — Z5111 Encounter for antineoplastic chemotherapy: Secondary | ICD-10-CM | POA: Diagnosis not present

## 2024-05-04 DIAGNOSIS — Z452 Encounter for adjustment and management of vascular access device: Secondary | ICD-10-CM | POA: Diagnosis not present

## 2024-05-04 DIAGNOSIS — Z510 Encounter for antineoplastic radiation therapy: Secondary | ICD-10-CM | POA: Diagnosis not present

## 2024-05-04 LAB — RAD ONC ARIA SESSION SUMMARY
Course Elapsed Days: 2
Plan Fractions Treated to Date: 2
Plan Prescribed Dose Per Fraction: 8 Gy
Plan Total Fractions Prescribed: 5
Plan Total Prescribed Dose: 40 Gy
Reference Point Dosage Given to Date: 16 Gy
Reference Point Session Dosage Given: 8 Gy
Session Number: 2

## 2024-05-05 ENCOUNTER — Ambulatory Visit: Admitting: Radiation Oncology

## 2024-05-05 ENCOUNTER — Ambulatory Visit: Payer: Medicare Other

## 2024-05-05 ENCOUNTER — Ambulatory Visit: Payer: PRIVATE HEALTH INSURANCE

## 2024-05-06 ENCOUNTER — Ambulatory Visit: Payer: Medicare Other

## 2024-05-06 ENCOUNTER — Ambulatory Visit: Payer: PRIVATE HEALTH INSURANCE | Admitting: Radiation Oncology

## 2024-05-06 ENCOUNTER — Ambulatory Visit

## 2024-05-06 ENCOUNTER — Other Ambulatory Visit: Payer: Self-pay

## 2024-05-06 ENCOUNTER — Ambulatory Visit
Admission: RE | Admit: 2024-05-06 | Discharge: 2024-05-06 | Disposition: A | Discharge status: Home / Self Care | Source: Ambulatory Visit | Attending: Radiation Oncology | Admitting: Radiation Oncology

## 2024-05-06 ENCOUNTER — Inpatient Hospital Stay: Payer: PRIVATE HEALTH INSURANCE | Admitting: Nurse Practitioner

## 2024-05-06 DIAGNOSIS — C3411 Malignant neoplasm of upper lobe, right bronchus or lung: Secondary | ICD-10-CM | POA: Diagnosis not present

## 2024-05-06 DIAGNOSIS — Z510 Encounter for antineoplastic radiation therapy: Secondary | ICD-10-CM | POA: Diagnosis not present

## 2024-05-06 DIAGNOSIS — Z5111 Encounter for antineoplastic chemotherapy: Secondary | ICD-10-CM | POA: Diagnosis not present

## 2024-05-06 DIAGNOSIS — Z452 Encounter for adjustment and management of vascular access device: Secondary | ICD-10-CM | POA: Diagnosis not present

## 2024-05-06 DIAGNOSIS — C772 Secondary and unspecified malignant neoplasm of intra-abdominal lymph nodes: Secondary | ICD-10-CM | POA: Diagnosis not present

## 2024-05-06 DIAGNOSIS — D6481 Anemia due to antineoplastic chemotherapy: Secondary | ICD-10-CM | POA: Diagnosis not present

## 2024-05-06 LAB — RAD ONC ARIA SESSION SUMMARY
Course Elapsed Days: 4
Plan Fractions Treated to Date: 3
Plan Prescribed Dose Per Fraction: 8 Gy
Plan Total Fractions Prescribed: 5
Plan Total Prescribed Dose: 40 Gy
Reference Point Dosage Given to Date: 24 Gy
Reference Point Session Dosage Given: 8 Gy
Session Number: 3

## 2024-05-10 ENCOUNTER — Ambulatory Visit: Admitting: Radiation Oncology

## 2024-05-10 ENCOUNTER — Ambulatory Visit: Payer: PRIVATE HEALTH INSURANCE | Admitting: Radiation Oncology

## 2024-05-10 ENCOUNTER — Other Ambulatory Visit: Payer: Self-pay

## 2024-05-10 ENCOUNTER — Ambulatory Visit
Admission: RE | Admit: 2024-05-10 | Discharge: 2024-05-10 | Disposition: A | Discharge status: Home / Self Care | Source: Ambulatory Visit | Attending: Radiation Oncology | Admitting: Radiation Oncology

## 2024-05-10 DIAGNOSIS — Z452 Encounter for adjustment and management of vascular access device: Secondary | ICD-10-CM | POA: Diagnosis not present

## 2024-05-10 DIAGNOSIS — C3411 Malignant neoplasm of upper lobe, right bronchus or lung: Secondary | ICD-10-CM | POA: Diagnosis not present

## 2024-05-10 DIAGNOSIS — Z5111 Encounter for antineoplastic chemotherapy: Secondary | ICD-10-CM | POA: Diagnosis not present

## 2024-05-10 DIAGNOSIS — Z510 Encounter for antineoplastic radiation therapy: Secondary | ICD-10-CM | POA: Diagnosis not present

## 2024-05-10 DIAGNOSIS — D6481 Anemia due to antineoplastic chemotherapy: Secondary | ICD-10-CM | POA: Diagnosis not present

## 2024-05-10 DIAGNOSIS — C772 Secondary and unspecified malignant neoplasm of intra-abdominal lymph nodes: Secondary | ICD-10-CM | POA: Diagnosis not present

## 2024-05-10 LAB — RAD ONC ARIA SESSION SUMMARY
Course Elapsed Days: 8
Plan Fractions Treated to Date: 4
Plan Prescribed Dose Per Fraction: 8 Gy
Plan Total Fractions Prescribed: 5
Plan Total Prescribed Dose: 40 Gy
Reference Point Dosage Given to Date: 32 Gy
Reference Point Session Dosage Given: 8 Gy
Session Number: 4

## 2024-05-11 ENCOUNTER — Ambulatory Visit: Payer: PRIVATE HEALTH INSURANCE

## 2024-05-11 ENCOUNTER — Ambulatory Visit

## 2024-05-12 ENCOUNTER — Other Ambulatory Visit: Payer: Self-pay

## 2024-05-12 ENCOUNTER — Ambulatory Visit: Payer: PRIVATE HEALTH INSURANCE | Admitting: Radiation Oncology

## 2024-05-12 ENCOUNTER — Ambulatory Visit
Admission: RE | Admit: 2024-05-12 | Discharge: 2024-05-12 | Discharge status: Home / Self Care | Source: Ambulatory Visit | Attending: Radiation Oncology

## 2024-05-12 ENCOUNTER — Ambulatory Visit
Admission: RE | Admit: 2024-05-12 | Discharge: 2024-05-12 | Disposition: A | Discharge status: Home / Self Care | Source: Ambulatory Visit | Attending: Radiation Oncology | Admitting: Radiation Oncology

## 2024-05-12 DIAGNOSIS — Z5111 Encounter for antineoplastic chemotherapy: Secondary | ICD-10-CM | POA: Diagnosis not present

## 2024-05-12 DIAGNOSIS — Z510 Encounter for antineoplastic radiation therapy: Secondary | ICD-10-CM | POA: Diagnosis not present

## 2024-05-12 DIAGNOSIS — Z452 Encounter for adjustment and management of vascular access device: Secondary | ICD-10-CM | POA: Diagnosis not present

## 2024-05-12 DIAGNOSIS — C772 Secondary and unspecified malignant neoplasm of intra-abdominal lymph nodes: Secondary | ICD-10-CM | POA: Diagnosis not present

## 2024-05-12 DIAGNOSIS — Z87891 Personal history of nicotine dependence: Secondary | ICD-10-CM | POA: Diagnosis not present

## 2024-05-12 DIAGNOSIS — C3411 Malignant neoplasm of upper lobe, right bronchus or lung: Secondary | ICD-10-CM | POA: Diagnosis not present

## 2024-05-12 DIAGNOSIS — C787 Secondary malignant neoplasm of liver and intrahepatic bile duct: Secondary | ICD-10-CM | POA: Diagnosis not present

## 2024-05-12 DIAGNOSIS — D6481 Anemia due to antineoplastic chemotherapy: Secondary | ICD-10-CM | POA: Diagnosis not present

## 2024-05-12 LAB — RAD ONC ARIA SESSION SUMMARY
Course Elapsed Days: 10
Plan Fractions Treated to Date: 5
Plan Prescribed Dose Per Fraction: 8 Gy
Plan Total Fractions Prescribed: 5
Plan Total Prescribed Dose: 40 Gy
Reference Point Dosage Given to Date: 40 Gy
Reference Point Session Dosage Given: 8 Gy
Session Number: 5

## 2024-05-13 ENCOUNTER — Ambulatory Visit: Payer: PRIVATE HEALTH INSURANCE

## 2024-05-13 ENCOUNTER — Ambulatory Visit

## 2024-05-13 NOTE — Radiation Completion Notes (Addendum)
  Radiation Oncology         [phone] ________________________________  Name: Adam Wise MRN: [id number]  Date of Service: 05/12/2024  DOB: 06-21-1951  End of Treatment Note   Diagnosis: Progressive metastatic extensive stage small cell carcinoma arising in the right chest overlapping the right upper lobe and right middle lobe and hilum with liver and bone disease   Intent: Curative     ==========DELIVERED PLANS==========  First Treatment Date: 2024-05-02 Last Treatment Date: 2024-05-12   Plan Name: Liver_SBRT Site: Liver Technique: SBRT/SRT-IMRT Mode: Photon Dose Per Fraction: 8 Gy Prescribed Dose (Delivered / Prescribed): 40 Gy / 40 Gy Prescribed Fxs (Delivered / Prescribed): 5 / 5     ==========ON TREATMENT VISIT DATES========== 2024-05-02, 2024-05-04, 2024-05-06, 2024-05-10, 2024-05-12, 2024-05-12    See weekly On Treatment Notes in Epic for details in the Media tab (listed as Progress notes on the On Treatment Visit Dates listed above). The patient tolerated radiation. He developed fatigue, constipation, and decreased appetite.   The patient will receive a call in about one month from the radiation oncology department. He will continue follow up with Dr. Marguerita Shih as well. He will follow up with me as well with MRI of the brain.     Shelvia Dick, PAC

## 2024-05-14 ENCOUNTER — Other Ambulatory Visit: Payer: Self-pay | Admitting: Primary Care

## 2024-05-14 DIAGNOSIS — I10 Essential (primary) hypertension: Secondary | ICD-10-CM

## 2024-05-15 NOTE — Telephone Encounter (Signed)
 Please call patient:  Received refill request for Amlodipine  10 mg for BP. The requested prescription says 1 tablet daily, someone changed this on his profile to 1/2 tablet daily. What is he actually taking? Also, a provider named Cassandra Helingoetter is listed as PCP. Was this a mistake?

## 2024-05-16 ENCOUNTER — Ambulatory Visit (HOSPITAL_COMMUNITY)
Admission: RE | Admit: 2024-05-16 | Discharge: 2024-05-16 | Disposition: A | Discharge status: Home / Self Care | Source: Ambulatory Visit | Attending: Radiation Oncology | Admitting: Radiation Oncology

## 2024-05-16 ENCOUNTER — Ambulatory Visit: Payer: PRIVATE HEALTH INSURANCE

## 2024-05-16 ENCOUNTER — Ambulatory Visit: Admitting: Radiation Oncology

## 2024-05-16 DIAGNOSIS — C3490 Malignant neoplasm of unspecified part of unspecified bronchus or lung: Secondary | ICD-10-CM | POA: Diagnosis not present

## 2024-05-16 DIAGNOSIS — C229 Malignant neoplasm of liver, not specified as primary or secondary: Secondary | ICD-10-CM | POA: Diagnosis not present

## 2024-05-16 DIAGNOSIS — C3411 Malignant neoplasm of upper lobe, right bronchus or lung: Secondary | ICD-10-CM | POA: Diagnosis not present

## 2024-05-16 MED ORDER — GADOBUTROL 1 MMOL/ML IV SOLN
8.0000 mL | Freq: Once | INTRAVENOUS | Status: AC | PRN
  Administered 2024-05-16: 8 mL via INTRAVENOUS

## 2024-05-16 NOTE — Telephone Encounter (Signed)
 Unable to reach patient. Left voicemail to return call to our office.

## 2024-05-17 ENCOUNTER — Other Ambulatory Visit: Payer: Self-pay | Admitting: Internal Medicine

## 2024-05-17 DIAGNOSIS — C3411 Malignant neoplasm of upper lobe, right bronchus or lung: Secondary | ICD-10-CM

## 2024-05-18 NOTE — Telephone Encounter (Signed)
 Unable to reach patient. Left voicemail to return call to our office.

## 2024-05-19 NOTE — Telephone Encounter (Signed)
Unable to reach patient. Left voicemail to return call to our office.   3rd attempt, mailing letter

## 2024-05-19 NOTE — Telephone Encounter (Signed)
 Noted.  Will decline refill request until we hear back from patient.

## 2024-05-22 NOTE — Progress Notes (Unsigned)
 [facility] OFFICE PROGRESS NOTE  Adam Wise, Adam Purdue, PA-C [address] [place] [address]  DIAGNOSIS: Relapsed extensive stage (T4, N3, M1c)  small cell lung cancer diagnosed in April 2021 and presented with large right upper lobe/right hilar mass with ipsilateral and contralateral mediastinal and right supraclavicular lymphadenopathy in addition to multiple liver lesions. He has disease progression after the first line of chemotherapy in December 2021.   PRIOR THERAPY: 1) Palliative radiotherapy to the right upper lobe lung mass under the care of Dr. Jeryl Moris. 2) Systemic chemotherapy with carboplatin  for AUC of 5 on day 1, etoposide  100 mg/M2 on days 1, 2 and 3 in addition to Imfinzi  1500 mg IV every 3 weeks with chemotherapy then every 4 weeks for maintenance if the patient has no evidence for progression.  He will also receive Cosela  240 mg/m2 on the days of the chemotherapy.  Status post 9 cycles.  Starting from cycle #5 the patient will be on maintenance treatment with immunotherapy with Imfinzi  1500 mg IV every 4 weeks. Last dose of chemotherapy was given on November 13, 2020. This treatment was discontinued secondary to disease progression 3) Systemic chemotherapy with carboplatin  for AUC of 5 on day 1, etoposide  100 mg/M2 on days 1, 2 and 3, Tecentriq  1200 mg IV every 3 weeks as well as Cosela  250 mg/M2 on the days of the chemotherapy every 3 weeks.  First dose December 18, 2020.  Status post 8 cycles. 4) Zepzelca  (lurbinectedin ) 3.2 mgm2 IV every 3 weeks. Last dose on 01/23/22. Status post 12 cycles.  5) Palliative systemic chemotherapy with irinotecan  65 mg/m2 on days 1 and 8 IV every 3 weeks.  Status post 3 cycles.  Last dose was given April 01, 2022 discontinued secondary to disease progression. 6) SBRT to the progressive liver lesions under the care of Dr. Jeryl Moris.  Last fraction January 27, 2023. 7) systemic chemotherapy with carboplatin  for AUC of 5 on day  1, etoposide  100 Mg/M2 on days 1, 2 and 3 with Cosela  before the chemotherapy.  First dose expected to start on 05/28/2022.  Status post 15 cycles.  Starting from cycle #5 his carboplatin  will be reduced to AUC of 4 and 2 etoposide  80 Mg/M2.  Last dose was giving April 07, 2023 discontinued for suspicious disease progression. 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 Jeryl Moris. Last dose on 05/16/24.   CURRENT THERAPY: 1) Topotecan  1.2 mg/M2 on days 1-5 every 3 weeks with Cosela  240 Mg/M2 IV before the chemotherapy.  First dose January 11, 2024. Status post 5 cycles.   INTERVAL HISTORY: Adam Wise 73 y.o. male returns to the clinic today for follow-up visit.  The was undergoing systemic chemotherapy with topotecan .  His restaging CT scan showed progression in the liver.  Therefore he was seen by radiation oncology and he underwent SBRT to the liver lesion the last dose was last week on 05/16/2024.  During this time his systemic treatment was on hold.  He tolerated his SBRT well.  The patient has been having some ongoing trouble with his back.  He did present to the emergency room on 04/26/2024.  He had a CT of the lumbar spine and pelvis which did not show any acute findings and there is no evidence of metastatic disease in the pelvis.  The CT of the lumbar spine showed no destructive osseous lesions but did show  degenerative changes in the lumbar spine and abnormal soft tissue and locules of gas involving the right neural foramen at L3 and 4 concerning for large disc extrusion as well as severe foraminal stenosis at L3-4 and severe spinal canal stenosis at L3 and 4.   A brain MRI was also ordered on 05/16/2024 for ***.  This showed areas of abnormal diffuse chin restricti within the paramedial left parietal lobe and posterior right cerebellum which may indicate early subacute infarcts or nonenhancing metastatic  lesions.  The plan is to***  He is here today to discuss resuming his topotecan  today.  He follows closely palliative care for hip and back pain.  He reports stable fatigue.  He denies any fever, chills, or night sweats.  Weight loss?  His shortness of breath is stable.  He denies any new cough.  Denies any hematic emesis or chest pain.  Denies any nausea, vomiting, or diarrhea.  Constipation?  He denies any rashes or skin changes.  He denies any headache or visual changes.  He is here today for evaluation repeat blood work before undergoing cycle #6   MEDICAL HISTORY: Past Medical History:  Diagnosis Date   Acute on chronic respiratory failure with hypoxia (HCC) 10/12/2021   Chickenpox    Chronic knee pain    Chronic low back pain    COPD exacerbation (HCC) 10/11/2021   COPD with exacerbation (HCC) 10/12/2021   Coronary artery calcification seen on CAT scan 11/2021   Coronary CTA 11/27/2021: Moderate to large right pleural effusion and compressive atelectasis right lung base. => Coronary Calcium Score 657.  Diffuse RCA calcification.  Minimal mild disease in the LAD and diagonal branches. == Overall limited study.  Notable artifact.   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 He has disease progression after the first line of chemotherapy in December 2021.   Testosterone  deficiency    Type 2 diabetes mellitus (HCC)     ALLERGIES:  is allergic to bupropion and hydrochlorothiazide.  MEDICATIONS:  Current Outpatient Medications  Medication Sig Dispense Refill   albuterol  (VENTOLIN  HFA)  108 (90 Base) MCG/ACT inhaler INHALE 2 PUFFS INTO THE LUNGS EVERY 4 (FOUR) HOURS AS NEEDED FOR SHORTNESS OF BREATH 18 each 0   amLODipine  (NORVASC ) 10 MG tablet Take 0.5 tablets (5 mg total) by mouth daily. TAKE 1 TABLET BY MOUTH EVERY DAY for blood pressure.     B-D 3CC LUER-LOK SYR 22GX1" 22G X 1" 3 ML MISC USE AS INSTRUCTED FOR TESTOSTERONE  INJECTION EVERY 2 WEEKS 10 each 2   Black Pepper-Turmeric (TURMERIC COMPLEX/BLACK PEPPER PO) Take 1 tablet by mouth in the morning and at bedtime.     Coenzyme Q10 (COQ10) 200 MG CAPS Take 200 mg by mouth at bedtime.     cyclobenzaprine  (FLEXERIL ) 5 MG tablet TAKE 1 TABLET BY MOUTH AT BEDTIME AS NEEDED FOR MUSCLE SPASMS. 90 tablet 0   docusate sodium  (COLACE) 100 MG capsule Take 1 capsule (100 mg total) by mouth every 12 (twelve) hours. 60 capsule 0   ELIQUIS  5 MG TABS tablet TAKE 1 TABLET BY MOUTH TWICE A DAY  180 tablet 2   esomeprazole (NEXIUM) 20 MG capsule Take 20 mg by mouth in the morning.     gabapentin  (NEURONTIN ) 300 MG capsule Take 1 capsule (300 mg total) by mouth 2 (two) times daily. For back pain. 180 capsule 1   glucose blood (ONETOUCH ULTRA) test strip USE UP TO 4 TIMES DAILY AS DIRECTED 400 strip 5   Insulin  Pen Needle (BD PEN NEEDLE NANO U/F) 32G X 4 MM MISC Use with insulin  as prescribed Dx Code: E11.9 100 each 3   ipratropium-albuterol  (DUONEB) 0.5-2.5 (3) MG/3ML SOLN Take 3 mLs by nebulization every 4 (four) hours while awake for 3 days, THEN 3 mLs every 4 (four) hours as needed (shortness of breath or wheezing). 360 mL 0   KRILL OIL PO Take 1 capsule by mouth daily.     Lancets MISC USE UP TO 3 TIMES DAILY AS DIRECTED 100 each 2   lidocaine  (LIDODERM ) 5 % Place 1 patch onto the skin daily. Remove & Discard patch within 12 hours or as directed by MD 30 patch 0   losartan  (COZAAR ) 50 MG tablet TAKE 1 TABLET (50 MG TOTAL) BY MOUTH DAILY. FOR BLOOD PRESSURE. 90 tablet 3   Melatonin 10 MG CAPS Take 10 mg by mouth at bedtime as needed (for  sleep).     metFORMIN  (GLUCOPHAGE ) 1000 MG tablet TAKE 1 TABLET (1,000 MG TOTAL) BY MOUTH 2 (TWO) TIMES DAILY WITH A MEAL. FOR DIABETES 180 tablet 1   mirtazapine  (REMERON ) 15 MG tablet TAKE 1 TABLET BY MOUTH EVERYDAY AT BEDTIME 90 tablet 1   morphine  (MS CONTIN ) 30 MG 12 hr tablet Take 1 tablet (30 mg total) by mouth every 8 (eight) hours. 90 tablet 0   Multiple Vitamin (MULTI-VITAMINS) TABS Take 1 tablet by mouth daily with breakfast.     Naloxone  HCl 3 MG/0.1ML LIQD Place 1 spray into both nostrils once. For known/suspected opiod overdose. Every 2-3 minutes in alternating nostril till EMS arrives.     nystatin  (MYCOSTATIN ) 100000 UNIT/ML suspension Take 5 mLs (500,000 Units total) by mouth 4 (four) times daily. 60 mL 0   ondansetron  (ZOFRAN ) 8 MG tablet Take 0.5 tablets (4 mg total) by mouth every 8 (eight) hours as needed for nausea or vomiting. 45 tablet 3   oxyCODONE -acetaminophen  (PERCOCET) 10-325 MG tablet Take 1 tablet by mouth every 4 (four) hours as needed. 60 tablet 0   pioglitazone  (ACTOS ) 45 MG tablet TAKE 1 TABLET (45 MG TOTAL) BY MOUTH DAILY. FOR DIABETES. 90 tablet 1   polyethylene glycol powder (GLYCOLAX /MIRALAX ) 17 GM/SCOOP powder Take by mouth once.     pravastatin  (PRAVACHOL ) 40 MG tablet TAKE 1 TABLET BY MOUTH EVERY DAY IN THE EVENING FOR CHOLESTEROL 90 tablet 3   PRESCRIPTION MEDICATION CPAP- At bedtime     testosterone  cypionate (DEPOTESTOSTERONE CYPIONATE) 200 MG/ML injection Inject 200 mg into the muscle every 14 (fourteen) days.     No current facility-administered medications for this visit.   Facility-Administered Medications Ordered in Other Visits  Medication Dose Route Frequency Provider Last Rate Last Admin   heparin  lock flush 100 unit/mL  500 Units Intracatheter Once Marlene Simas, MD       sodium chloride  flush (NS) 0.9 % injection 10 mL  10 mL Intracatheter Once Marlene Simas, MD        SURGICAL HISTORY:  Past Surgical History:  Procedure Laterality  Date   CHEST TUBE INSERTION Right 01/20/2022   Procedure: CHEST TUBE INSERTION;  Surgeon: Felipe Horton,  Tino Foreman, MD;  Location: [facility] ENDOSCOPY;  Service: Pulmonary;  Laterality: Right;  w/ Talc  Pleurodesis, planned admit for Obs afterwards   CHEST TUBE INSERTION Right 04/28/2022   Procedure: INSERTION PLEURAL DRAINAGE CATHETER - Pigtail tail, drainage;  Surgeon: Josiah Nigh, MD;  Location: [facility] ENDOSCOPY;  Service: Pulmonary;  Laterality: Right;  talc  pleurodesis   CHEST TUBE INSERTION Right 09/01/2022   Procedure: INSERTION PLEURAL DRAINAGE CATHETER;  Surgeon: Marine Sia, MD;  Location: [facility] ENDOSCOPY;  Service: Cardiopulmonary;  Laterality: Right;  pigtail catheter placement with talc  pleurodesis   COLONOSCOPY WITH PROPOFOL  N/A 12/17/2018   Procedure: COLONOSCOPY WITH PROPOFOL ;  Surgeon: Irby Mannan, MD;  Location: [facility] ENDOSCOPY;  Service: Gastroenterology;  Laterality: N/A;   IR IMAGING GUIDED PORT INSERTION  04/17/2020   IR THORACENTESIS ASP PLEURAL SPACE W/IMG GUIDE  11/29/2021   IR THORACENTESIS ASP PLEURAL SPACE W/IMG GUIDE  01/03/2022   JOINT REPLACEMENT Bilateral    REPLACEMENT TOTAL KNEE BILATERAL  2015   TALC  PLEURODESIS  09/01/2022   Procedure: TALC  PLEURADESIS;  Surgeon: Marine Sia, MD;  Location: [facility] ENDOSCOPY;  Service: Cardiopulmonary;;   TONSILLECTOMY  1960   TRANSTHORACIC ECHOCARDIOGRAM  05/2020   a) 05/2020: EF 55 to 60%.  No R WMA.  Mild LVH.  Indeterminate LVEDP.  Unable to assess RVP.  Normal aortic and mitral valves.  Mildly elevated RAP.; b) 09/2021: EF 50-55%. No RWMA. Mild LVH. ~ LVEDP. Mild LA Dilation. NORMAL RV/RAP.  Normal MV/AoV.    REVIEW OF SYSTEMS:   Review of Systems  Constitutional: Negative for appetite change, chills, fatigue, fever and unexpected weight change.  HENT:   Negative for mouth sores, nosebleeds, sore throat and trouble swallowing.   Eyes: Negative for eye problems and icterus.  Respiratory: Negative for cough, hemoptysis,  shortness of breath and wheezing.   Cardiovascular: Negative for chest pain and leg swelling.  Gastrointestinal: Negative for abdominal pain, constipation, diarrhea, nausea and vomiting.  Genitourinary: Negative for bladder incontinence, difficulty urinating, dysuria, frequency and hematuria.   Musculoskeletal: Negative for back pain, gait problem, neck pain and neck stiffness.  Skin: Negative for itching and rash.  Neurological: Negative for dizziness, extremity weakness, gait problem, headaches, light-headedness and seizures.  Hematological: Negative for adenopathy. Does not bruise/bleed easily.  Psychiatric/Behavioral: Negative for confusion, depression and sleep disturbance. The patient is not nervous/anxious.     PHYSICAL EXAMINATION:  There were no vitals taken for this visit.  ECOG PERFORMANCE STATUS: {CHL ONC ECOG [id number]  Physical Exam  Constitutional: Oriented to person, place, and time and well-developed, well-nourished, and in no distress. No distress.  HENT:  Head: Normocephalic and atraumatic.  Mouth/Throat: Oropharynx is clear and moist. No oropharyngeal exudate.  Eyes: Conjunctivae are normal. Right eye exhibits no discharge. Left eye exhibits no discharge. No scleral icterus.  Neck: Normal range of motion. Neck supple.  Cardiovascular: Normal rate, regular rhythm, normal heart sounds and intact distal pulses.   Pulmonary/Chest: Effort normal and breath sounds normal. No respiratory distress. No wheezes. No rales.  Abdominal: Soft. Bowel sounds are normal. Exhibits no distension and no mass. There is no tenderness.  Musculoskeletal: Normal range of motion. Exhibits no edema.  Lymphadenopathy:    No cervical adenopathy.  Neurological: Alert and oriented to person, place, and time. Exhibits normal muscle tone. Gait normal. Coordination normal.  Skin: Skin is warm and dry. No rash noted. Not diaphoretic. No erythema. No pallor.  Psychiatric: Mood, memory and  judgment normal.  Vitals reviewed.  LABORATORY DATA: Lab Results  Component Value Date   WBC 3.2 (L) 04/26/2024   HGB 8.4 (L) 04/26/2024   HCT 25.6 (L) 04/26/2024   MCV 101.6 (H) 04/26/2024   PLT 45 (L) 04/26/2024      Chemistry      Component Value Date/Time   NA 131 (L) 04/26/2024 1047   K 3.7 04/26/2024 1047   CL 99 04/26/2024 1047   CO2 24 04/26/2024 1047   BUN 13 04/26/2024 1047   CREATININE 0.99 04/26/2024 1047   CREATININE 1.09 04/25/2024 1220   CREATININE 1.45 (H) 10/18/2021 1533      Component Value Date/Time   CALCIUM 7.9 (L) 04/26/2024 1047   ALKPHOS 81 04/26/2024 1047   AST 26 04/26/2024 1047   AST 22 04/25/2024 1220   ALT 16 04/26/2024 1047   ALT 14 04/25/2024 1220   BILITOT 0.8 04/26/2024 1047   BILITOT 0.3 04/25/2024 1220       RADIOGRAPHIC STUDIES:  MR Brain W Wo Contrast Result Date: 05/21/2024 CLINICAL DATA:  Small cell lung carcinoma staging EXAM: MRI HEAD WITHOUT AND WITH CONTRAST TECHNIQUE: Multiplanar, multiecho pulse sequences of the brain and surrounding structures were obtained without and with intravenous contrast. CONTRAST:  8mL GADAVIST  GADOBUTROL  1 MMOL/ML IV SOLN COMPARISON:  02/07/2020 FINDINGS: Brain: There are areas of abnormal diffusion restriction within the paramedian left parietal lobe and the posterior right cerebellum. There is no associated contrast enhancement. Chronic microhemorrhage in the left cerebellum. Bilateral, frontal predominant, multifocal hyperintense T2-weighted signal in the white matter, as may be seen in patients with migraines, but is also commonly present in asymptomatic patients. The midline structures are normal. Vascular: Normal flow voids. Skull and upper cervical spine: Normal calvarium and skull base. Visualized upper cervical spine and soft tissues are normal. Sinuses/Orbits:No paranasal sinus fluid levels or advanced mucosal thickening. No mastoid or middle ear effusion. Normal orbits. IMPRESSION: Areas of  abnormal diffusion restriction within the paramedian left parietal lobe and the posterior right cerebellum, which may indicate early subacute infarcts or nonenhancing metastatic lesions. Electronically Signed   By: Juanetta Nordmann M.D.   On: 05/21/2024 03:42   CT Lumbar Spine Wo Contrast Result Date: 04/26/2024 CLINICAL DATA:  Metastatic disease evaluation, weakness and diarrhea for 2 days. History of lung cancer. EXAM: CT LUMBAR SPINE WITHOUT CONTRAST TECHNIQUE: Multidetector CT imaging of the lumbar spine was performed without intravenous contrast administration. Multiplanar CT image reconstructions were also generated. RADIATION DOSE REDUCTION: This exam was performed according to the departmental dose-optimization program which includes automated exposure control, adjustment of the mA and/or kV according to patient size and/or use of iterative reconstruction technique. COMPARISON:  CT chest abdomen pelvis 03/08/2024. FINDINGS: Segmentation: 5 lumbar type vertebrae. Alignment: Lumbar lordosis is maintained.  No significant listhesis. Vertebrae: No compression fracture or displaced fracture in the lumbar spine. Bilateral L5 pars defects. Schmorl's nodes at multiple levels. Degenerative endplate osteophytes throughout the lower thoracic and lumbar spine. No destructive osseous lesion. Paraspinal and other soft tissues: The visualized paraspinal soft tissues are unremarkable. Similar appearance of pleural density along the right lung base which may reflect sequelae of prior talc  pleurodesis. Moderate atherosclerosis of the abdominal aorta and branch vessels. Similar nodularity of the right adrenal gland previously characterized as adrenal adenoma. Disc levels: Disc space narrowing at multiple levels in the visualized lower thoracic spine. Severe disc space narrowing at T12-L1. Additional moderate disc space narrowing at L3-4 and severe disc space narrowing at L4-5. Vacuum disc phenomenon at multiple  levels. Disc  bulges and posterior osteophytic ridging at multiple levels. Disc bulge and facet arthrosis at L2-3 resulting in mild spinal canal stenosis. Disc bulge, facet arthrosis, and thickening of the ligamentum flavum at L3-4 resulting in severe spinal canal stenosis. Additional disc bulge and posterior osteophytes along with facet arthrosis and thickening of the ligamentum flavum at L4-5 resulting in at least moderate spinal canal stenosis. There is abnormal soft tissue and locules of gas involving the right neural foramen at L3-4 which could reflect a large disc extrusion. There is likely severe foraminal narrowing at this level. Additional moderate foraminal narrowing on the right at L3-4. Moderate foraminal narrowing bilaterally at L4-5. IMPRESSION: No destructive osseous lesion appreciated. Within limitations of CT there is no definite osseous metastatic disease in the lumbar spine. Recommend contrast enhanced MRI lumbar spine for further evaluation. Degenerative changes of the lumbar spine as above. Abnormal soft tissue and locules of gas involving the right neural foramen at L3-4 concerning for large disc extrusion. There is associated severe foraminal stenosis on the right at L3-4. Severe spinal canal stenosis at L3-4. Bilateral pars defects at L5. Electronically Signed   By: Denny Flack M.D.   On: 04/26/2024 15:19   CT PELVIS WO CONTRAST Result Date: 04/26/2024 CLINICAL DATA:  Two day history of weakness and diarrhea. History of lung cancer for evaluation of metastatic disease EXAM: CT PELVIS WITHOUT CONTRAST TECHNIQUE: Multidetector CT imaging of the pelvis was performed following the standard protocol without intravenous contrast. RADIATION DOSE REDUCTION: This exam was performed according to the departmental dose-optimization program which includes automated exposure control, adjustment of the mA and/or kV according to patient size and/or use of iterative reconstruction technique. COMPARISON:  CT abdomen  and pelvis dated 03/08/2024 FINDINGS: Urinary Tract: No focal bladder wall thickening. Bowel: Partially imaged bowel without bowel wall thickening, distention, or inflammatory changes. Normal appendix. Moderate volume stool throughout the partially imaged colon. Colonic diverticulosis without acute diverticulitis. Vascular/Lymphatic: Aortic atherosclerosis. No enlarged abdominal or pelvic lymph nodes. Reproductive: Enlargement of the prostate with median lobe hypertrophy. Other: No free fluid, fluid collection, or free air. Musculoskeletal: No acute or abnormal lytic or blastic osseous lesions. Bilateral L5 pars interarticularis defects without listhesis. IMPRESSION: 1. No acute findings in the pelvis. 2. No evidence of metastatic disease in the pelvis. 3. Moderate volume stool throughout the partially imaged colon. 4. Enlargement of the prostate with median lobe hypertrophy. 5.  Aortic Atherosclerosis (ICD10-I70.0). Electronically Signed   By: Limin  Xu M.D.   On: 04/26/2024 15:15     ASSESSMENT/PLAN:  This is a very pleasant 73 year old Caucasian male diagnosed with extensive stage (T4, N3, M1C) small cell lung cancer presented with large right upper lobe lung mass in addition to mediastinal and right supraclavicular lymphadenopathy and multiple metastatic liver lesions diagnosed in April 2021.    The patient initially underwent systemic chemotherapy with carboplatin  for an AUC of 5 on day 1, etoposide  100 mg per metered squared on days 1, 2, and 3 in addition to Cosela  for myeloprotection.  He also received immunotherapy with Imfinzi  on day 1 of every chemotherapy cycle.  He is status post 8 cycles.  Starting from cycle #5 he was on single agent immunotherapy with Imfinzi  IV every 4 weeks   The patient had evidence of disease progression on his scan from December 2021.   He was then started on systemic chemotherapy with carboplatin  for an AUC of 5 on day 1, etoposide  100 mg per metered  squared on days  1, 2, and 3 in addition to Tecentriq  1200 mg on day 1, the patient is status post 8 cycles.  Starting from cycle #5, the patient started maintenance immunotherapy with Tecentriq .  This was discontinued due to evidence of disease progression.  The patient then underwent Zepzelca  IV every 3 weeks.  He is status post his 12 cycles and he tolerated it well except for fatigue a few days after treatment.  This was discontinued in February 2023 due to evidence of disease progression.    The patient then underwent treatment with irinotecan  65 mg per metered squared on days 1 and 8 IV every 3 weeks.  The patient is status post 3 cycles and tolerated it fairly well without any concerning adverse side effects.  This was discontinued secondary to disease progression.  Dr. Marguerita Shih saw the patient after this to discuss the options including palliative care and hospice versus single agent Gemzar, or Taxol.  He also discussed repeating his initial treatment with carboplatin  and etoposide  since he had a good response to treatment in the past.   He was referred to Dr. Sibyl Drafts at [facility]  for consideration of enrollment in the clinical trial with tarlatamab.    He also saw Dr. Rhea Cecil at [facility] for consideration of enrollment in the [facility] [id number] trial.  The patient was not interested due to the travel burden.   The patient was interested in restarting systemic chemotherapy with carboplatin  for an AUC of 5 on day 1 and etoposide  100 mg per metered squared on days 1, 2, and 3 with Cosela . He is status post 14 cycles and tolerated it well. His dose of carboplatin  was reduced to an AUC of 4 and etoposide  80 mg per metered squared.    He underwent SBRT to this which was completed on 01/27/23.    The patient was seen by Dr. Sibyl Drafts at [facility] and treated with tarlatamab which is a by specific T-cell engager (BiTE) for the last 8 months.  Unfortunately the repeat CT scan of the chest, abdomen and pelvis in December  2024 showed interval mild to moderate increase in the size of the segment 7 liver lesion in addition to interval mild increase in the size of portacaval and common hepatic artery lymph nodes with stable size retroperitoneal lymph nodes and stable size right adrenal nodule.  There was no evidence for disease progression in the chest.  There was focal enhancement in the right iliac bone concerning for metastatic disease.    He completed palliative radiation to the right iliac bone lesion in February 2025.  Dr. Marguerita Shih recommended Topotecan  regimen: 5 days in a row every 3 weeks.  He underwent his three cycles of treatment on 01/11/2024.  He tolerated it fairly well except he did have some cytopenias.  He is status post 5 cycles.  He had some progressive disease in the upper abdominal lymph nodes/liver.   He underwent SBRT and completed this on 05/16/2024.  He is here to resume his systemic treatment today. The patient was seen with Dr. Marguerita Shih.  Recommend that He***with cycle #6 today as scheduled.  Will see him in 3 weeks for evaluation repeat blood work before undergoing cycle #7.  He is scheduled to see palliative care.  He will continue taking Remeron  at night to help with his decreased appetite.  Brain?  The patient was advised to call immediately if he has any concerning symptoms in the interval. The patient voices understanding  of current disease status and treatment options and is in agreement with the current care plan. All questions were answered. The patient knows to call the clinic with any problems, questions or concerns. We can certainly see the patient much sooner if necessary   No orders of the defined types were placed in this encounter.    I spent {CHL ONC TIME VISIT - XBMWU:[id number]} counseling the patient face to face. The total time spent in the appointment was {CHL ONC TIME VISIT - OZDGU:[id number]}.  Yazleen Molock L Henrry Feil, PA-C 05/22/24

## 2024-05-23 ENCOUNTER — Inpatient Hospital Stay: Payer: PRIVATE HEALTH INSURANCE

## 2024-05-23 ENCOUNTER — Inpatient Hospital Stay: Payer: PRIVATE HEALTH INSURANCE | Attending: Internal Medicine

## 2024-05-23 ENCOUNTER — Ambulatory Visit: Payer: Self-pay | Admitting: Radiation Oncology

## 2024-05-23 ENCOUNTER — Inpatient Hospital Stay (HOSPITAL_BASED_OUTPATIENT_CLINIC_OR_DEPARTMENT_OTHER): Payer: PRIVATE HEALTH INSURANCE | Admitting: Physician Assistant

## 2024-05-23 VITALS — BP 108/72 | HR 100 | Temp 97.9°F | Resp 17 | Wt 245.9 lb

## 2024-05-23 DIAGNOSIS — K5900 Constipation, unspecified: Secondary | ICD-10-CM | POA: Insufficient documentation

## 2024-05-23 DIAGNOSIS — E785 Hyperlipidemia, unspecified: Secondary | ICD-10-CM | POA: Insufficient documentation

## 2024-05-23 DIAGNOSIS — G893 Neoplasm related pain (acute) (chronic): Secondary | ICD-10-CM | POA: Diagnosis not present

## 2024-05-23 DIAGNOSIS — I10 Essential (primary) hypertension: Secondary | ICD-10-CM | POA: Insufficient documentation

## 2024-05-23 DIAGNOSIS — Z515 Encounter for palliative care: Secondary | ICD-10-CM | POA: Diagnosis not present

## 2024-05-23 DIAGNOSIS — E119 Type 2 diabetes mellitus without complications: Secondary | ICD-10-CM | POA: Diagnosis not present

## 2024-05-23 DIAGNOSIS — Z5111 Encounter for antineoplastic chemotherapy: Secondary | ICD-10-CM | POA: Insufficient documentation

## 2024-05-23 DIAGNOSIS — C3411 Malignant neoplasm of upper lobe, right bronchus or lung: Secondary | ICD-10-CM | POA: Diagnosis not present

## 2024-05-23 DIAGNOSIS — D649 Anemia, unspecified: Secondary | ICD-10-CM | POA: Insufficient documentation

## 2024-05-23 DIAGNOSIS — C7951 Secondary malignant neoplasm of bone: Secondary | ICD-10-CM | POA: Diagnosis not present

## 2024-05-23 LAB — CMP (CANCER CENTER ONLY)
ALT: 16 U/L (ref 0–44)
AST: 29 U/L (ref 15–41)
Albumin: 3.5 g/dL (ref 3.5–5.0)
Alkaline Phosphatase: 72 U/L (ref 38–126)
Anion gap: 7 (ref 5–15)
BUN: 16 mg/dL (ref 8–23)
CO2: 29 mmol/L (ref 22–32)
Calcium: 8.8 mg/dL — ABNORMAL LOW (ref 8.9–10.3)
Chloride: 103 mmol/L (ref 98–111)
Creatinine: 1.05 mg/dL (ref 0.61–1.24)
GFR, Estimated: >60 mL/min (ref >60)
Glucose, Bld: 185 mg/dL — ABNORMAL HIGH (ref 70–99)
Potassium: 4.5 mmol/L (ref 3.5–5.1)
Sodium: 139 mmol/L (ref 135–145)
Total Bilirubin: 0.3 mg/dL (ref 0.0–1.2)
Total Protein: 5.7 g/dL — ABNORMAL LOW (ref 6.5–8.1)

## 2024-05-23 LAB — CBC WITH DIFFERENTIAL (CANCER CENTER ONLY)
Abs Immature Granulocytes: 0.01 10*3/uL (ref 0.00–0.07)
Basophils Absolute: 0 10*3/uL (ref 0.0–0.1)
Basophils Relative: 1 %
Eosinophils Absolute: 0.1 10*3/uL (ref 0.0–0.5)
Eosinophils Relative: 1 %
HCT: 31 % — ABNORMAL LOW (ref 39.0–52.0)
Hemoglobin: 10 g/dL — ABNORMAL LOW (ref 13.0–17.0)
Immature Granulocytes: 0 %
Lymphocytes Relative: 3 %
Lymphs Abs: 0.2 10*3/uL — ABNORMAL LOW (ref 0.7–4.0)
MCH: 31.9 pg (ref 26.0–34.0)
MCHC: 32.3 g/dL (ref 30.0–36.0)
MCV: 99 fL (ref 80.0–100.0)
Monocytes Absolute: 0.5 10*3/uL (ref 0.1–1.0)
Monocytes Relative: 9 %
Neutro Abs: 4.6 10*3/uL (ref 1.7–7.7)
Neutrophils Relative %: 86 %
Platelet Count: 147 10*3/uL — ABNORMAL LOW (ref 150–400)
RBC: 3.13 MIL/uL — ABNORMAL LOW (ref 4.22–5.81)
RDW: 15.4 % (ref 11.5–15.5)
WBC Count: 5.4 10*3/uL (ref 4.0–10.5)
nRBC: 0 % (ref 0.0–0.2)

## 2024-05-23 LAB — SAMPLE TO BLOOD BANK

## 2024-05-23 MED ORDER — SODIUM CHLORIDE 0.9% FLUSH: 10.0000 mL | INTRAVENOUS | Status: DC | PRN

## 2024-05-23 MED ORDER — PROCHLORPERAZINE MALEATE 10 MG PO TABS
10.0000 mg | ORAL_TABLET | Freq: Once | ORAL | Status: AC
  Administered 2024-05-23: 10 mg via ORAL
  Filled 2024-05-23: qty 1

## 2024-05-23 MED ORDER — TRILACICLIB DIHYDROCHLORIDE INJECTION 300 MG
240.0000 mg/m2 | Freq: Once | INTRAVENOUS | Status: AC
  Administered 2024-05-23: 600 mg via INTRAVENOUS
  Filled 2024-05-23: qty 40

## 2024-05-23 MED ORDER — SODIUM CHLORIDE 0.9 % IV SOLN: INTRAVENOUS | Status: DC

## 2024-05-23 MED ORDER — TOPOTECAN HCL CHEMO INJECTION 4 MG/4ML
1.0000 mg/m2 | Freq: Once | INTRAVENOUS | Status: AC
  Administered 2024-05-23: 2.4 mg via INTRAVENOUS
  Filled 2024-05-23: qty 2.4

## 2024-05-23 MED ORDER — HEPARIN SOD (PORK) LOCK FLUSH 100 UNIT/ML IV SOLN
500.0000 [IU] | Freq: Once | INTRAVENOUS | Status: DC | PRN
Start: 2024-05-23 — End: 2024-05-23

## 2024-05-23 NOTE — Telephone Encounter (Signed)
 Spoke with patients wife, advised we were calling to clarify what dose of amlodipine  he is taking. Patients wife was unsure and patient was still sleeping, she will message on MyChart when he wakes up and can ask him.  She did advise the change in PCP on chart was a mistake.

## 2024-05-23 NOTE — Patient Instructions (Signed)
 [facility] - A DEPT OF [facility]  Discharge Instructions: Thank you for choosing [facility] to provide your oncology and hematology care.   If you have a lab appointment with the Cancer Center, please go directly to the Cancer Center and check in at the registration area.   Wear comfortable clothing and clothing appropriate for easy access to any Portacath or PICC line.   We strive to give you quality time with your provider. You may need to reschedule your appointment if you arrive late (15 or more minutes).  Arriving late affects you and other patients whose appointments are after yours.  Also, if you miss three or more appointments without notifying the office, you may be dismissed from the clinic at the provider's discretion.      For prescription refill requests, have your pharmacy contact our office and allow 72 hours for refills to be completed.    Today you received the following chemotherapy and/or immunotherapy agents: Cosela , Hycamptin      To help prevent nausea and vomiting after your treatment, we encourage you to take your nausea medication as directed.  BELOW ARE SYMPTOMS THAT SHOULD BE REPORTED IMMEDIATELY: *FEVER GREATER THAN 100.4 F (38 C) OR HIGHER *CHILLS OR SWEATING *NAUSEA AND VOMITING THAT IS NOT CONTROLLED WITH YOUR NAUSEA MEDICATION *UNUSUAL SHORTNESS OF BREATH *UNUSUAL BRUISING OR BLEEDING *URINARY PROBLEMS (pain or burning when urinating, or frequent urination) *BOWEL PROBLEMS (unusual diarrhea, constipation, pain near the anus) TENDERNESS IN MOUTH AND THROAT WITH OR WITHOUT PRESENCE OF ULCERS (sore throat, sores in mouth, or a toothache) UNUSUAL RASH, SWELLING OR PAIN  UNUSUAL VAGINAL DISCHARGE OR ITCHING   Items with * indicate a potential emergency and should be followed up as soon as possible or go to the Emergency Department if any problems should occur.  Please show the CHEMOTHERAPY ALERT CARD or  IMMUNOTHERAPY ALERT CARD at check-in to the Emergency Department and triage nurse.  Should you have questions after your visit or need to cancel or reschedule your appointment, please contact [facility] - A DEPT [facility]  Dept: [phone]  and follow the prompts.  Office hours are 8:00 a.m. to 4:30 p.m. Monday - Friday. Please note that voicemails left after 4:00 p.m. may not be returned until the following business day.  We are closed weekends and major holidays. You have access to a nurse at all times for urgent questions. Please call the main number to the clinic Dept: [phone] and follow the prompts.   For any non-urgent questions, you may also contact your provider using MyChart. We now offer e-Visits for anyone 74 and older to request care online for non-urgent symptoms. For details visit [email or web address].   Also download the MyChart app! Go to the app store, search "MyChart", open the app, select [place], and log in with your MyChart username and password.

## 2024-05-24 ENCOUNTER — Inpatient Hospital Stay: Payer: PRIVATE HEALTH INSURANCE

## 2024-05-24 VITALS — BP 107/69 | HR 100 | Temp 97.9°F | Resp 18

## 2024-05-24 DIAGNOSIS — G893 Neoplasm related pain (acute) (chronic): Secondary | ICD-10-CM | POA: Diagnosis not present

## 2024-05-24 DIAGNOSIS — C3411 Malignant neoplasm of upper lobe, right bronchus or lung: Secondary | ICD-10-CM

## 2024-05-24 DIAGNOSIS — Z5111 Encounter for antineoplastic chemotherapy: Secondary | ICD-10-CM | POA: Diagnosis not present

## 2024-05-24 DIAGNOSIS — Z515 Encounter for palliative care: Secondary | ICD-10-CM | POA: Diagnosis not present

## 2024-05-24 DIAGNOSIS — D649 Anemia, unspecified: Secondary | ICD-10-CM | POA: Diagnosis not present

## 2024-05-24 DIAGNOSIS — C7951 Secondary malignant neoplasm of bone: Secondary | ICD-10-CM | POA: Diagnosis not present

## 2024-05-24 MED ORDER — PROCHLORPERAZINE MALEATE 10 MG PO TABS
10.0000 mg | ORAL_TABLET | Freq: Once | ORAL | Status: AC
  Administered 2024-05-24: 10 mg via ORAL
  Filled 2024-05-24: qty 1

## 2024-05-24 MED ORDER — SODIUM CHLORIDE 0.9% FLUSH
10.0000 mL | INTRAVENOUS | Status: DC | PRN
  Administered 2024-05-24: 10 mL

## 2024-05-24 MED ORDER — TOPOTECAN HCL CHEMO INJECTION 4 MG/4ML
1.0000 mg/m2 | Freq: Once | INTRAVENOUS | Status: AC
  Administered 2024-05-24: 2.4 mg via INTRAVENOUS
  Filled 2024-05-24: qty 2.4

## 2024-05-24 MED ORDER — HEPARIN SOD (PORK) LOCK FLUSH 100 UNIT/ML IV SOLN
500.0000 [IU] | Freq: Once | INTRAVENOUS | Status: AC | PRN
  Administered 2024-05-24: 500 [IU]

## 2024-05-24 MED ORDER — TRILACICLIB DIHYDROCHLORIDE INJECTION 300 MG
240.0000 mg/m2 | Freq: Once | INTRAVENOUS | Status: AC
  Administered 2024-05-24: 600 mg via INTRAVENOUS
  Filled 2024-05-24: qty 40

## 2024-05-24 NOTE — Patient Instructions (Signed)
 [name]  Discharge Instructions: Thank you for choosing [facility] to provide your oncology and hematology care.   If you have a lab appointment with the Cancer Center, please go directly to the Cancer Center and check in at the registration area.   Wear comfortable clothing and clothing appropriate for easy access to any Portacath or PICC line.   We strive to give you quality time with your provider. You may need to reschedule your appointment if you arrive late (15 or more minutes).  Arriving late affects you and other patients whose appointments are after yours.  Also, if you miss three or more appointments without notifying the office, you may be dismissed from the clinic at the provider's discretion.      For prescription refill requests, have your pharmacy contact our office and allow 72 hours for refills to be completed.    Today you received the following chemotherapy and/or immunotherapy agents: Cosela, Topotecan.       To help prevent nausea and vomiting after your treatment, we encourage you to take your nausea medication as directed.  BELOW ARE SYMPTOMS THAT SHOULD BE REPORTED IMMEDIATELY: *FEVER GREATER THAN 100.4 F (38 C) OR HIGHER *CHILLS OR SWEATING *NAUSEA AND VOMITING THAT IS NOT CONTROLLED WITH YOUR NAUSEA MEDICATION *UNUSUAL SHORTNESS OF BREATH *UNUSUAL BRUISING OR BLEEDING *URINARY PROBLEMS (pain or burning when urinating, or frequent urination) *BOWEL PROBLEMS (unusual diarrhea, constipation, pain near the anus) TENDERNESS IN MOUTH AND THROAT WITH OR WITHOUT PRESENCE OF ULCERS (sore throat, sores in mouth, or a toothache) UNUSUAL RASH, SWELLING OR PAIN  UNUSUAL VAGINAL DISCHARGE OR ITCHING   Items with * indicate a potential emergency and should be followed up as soon as possible or go to the Emergency Department if any problems should occur.  Please show the CHEMOTHERAPY ALERT CARD or  IMMUNOTHERAPY ALERT CARD at check-in to the Emergency Department and triage nurse.  Should you have questions after your visit or need to cancel or reschedule your appointment, please contact [facility]  Dept: [phone]  and follow the prompts.  Office hours are 8:00 a.m. to 4:30 p.m. Monday - Friday. Please note that voicemails left after 4:00 p.m. may not be returned until the following business day.  We are closed weekends and major holidays. You have access to a nurse at all times for urgent questions. Please call the main number to the clinic Dept: [phone] and follow the prompts.   For any non-urgent questions, you may also contact your provider using MyChart. We now offer e-Visits for anyone 16 and older to request care online for non-urgent symptoms. For details visit [email or web address].   Also download the MyChart app! Go to the app store, search "MyChart", open the app, select [place], and log in with your MyChart username and password.

## 2024-05-25 ENCOUNTER — Inpatient Hospital Stay: Payer: PRIVATE HEALTH INSURANCE

## 2024-05-25 VITALS — BP 109/73 | HR 92 | Temp 97.6°F

## 2024-05-25 DIAGNOSIS — C3411 Malignant neoplasm of upper lobe, right bronchus or lung: Secondary | ICD-10-CM | POA: Diagnosis not present

## 2024-05-25 DIAGNOSIS — Z515 Encounter for palliative care: Secondary | ICD-10-CM | POA: Diagnosis not present

## 2024-05-25 DIAGNOSIS — D649 Anemia, unspecified: Secondary | ICD-10-CM | POA: Diagnosis not present

## 2024-05-25 DIAGNOSIS — G893 Neoplasm related pain (acute) (chronic): Secondary | ICD-10-CM | POA: Diagnosis not present

## 2024-05-25 DIAGNOSIS — Z5111 Encounter for antineoplastic chemotherapy: Secondary | ICD-10-CM | POA: Diagnosis not present

## 2024-05-25 DIAGNOSIS — C7951 Secondary malignant neoplasm of bone: Secondary | ICD-10-CM | POA: Diagnosis not present

## 2024-05-25 MED ORDER — PROCHLORPERAZINE MALEATE 10 MG PO TABS
10.0000 mg | ORAL_TABLET | Freq: Once | ORAL | Status: AC
  Administered 2024-05-25: 10 mg via ORAL
  Filled 2024-05-25: qty 1

## 2024-05-25 MED ORDER — TOPOTECAN HCL CHEMO INJECTION 4 MG/4ML
1.0000 mg/m2 | Freq: Once | INTRAVENOUS | Status: AC
  Administered 2024-05-25: 2.4 mg via INTRAVENOUS
  Filled 2024-05-25: qty 2.4

## 2024-05-25 MED ORDER — HEPARIN SOD (PORK) LOCK FLUSH 100 UNIT/ML IV SOLN
500.0000 [IU] | Freq: Once | INTRAVENOUS | Status: AC | PRN
  Administered 2024-05-25: 500 [IU]

## 2024-05-25 MED ORDER — SODIUM CHLORIDE 0.9% FLUSH
10.0000 mL | INTRAVENOUS | Status: DC | PRN
  Administered 2024-05-25: 10 mL

## 2024-05-25 MED ORDER — TRILACICLIB DIHYDROCHLORIDE INJECTION 300 MG
240.0000 mg/m2 | Freq: Once | INTRAVENOUS | Status: AC
  Administered 2024-05-25: 600 mg via INTRAVENOUS
  Filled 2024-05-25: qty 40

## 2024-05-25 MED ORDER — SODIUM CHLORIDE 0.9 % IV SOLN: INTRAVENOUS | Status: DC

## 2024-05-25 NOTE — Patient Instructions (Signed)
 [facility] - A DEPT OF [facility]  Discharge Instructions: Thank you for choosing [facility] to provide your oncology and hematology care.   If you have a lab appointment with the Cancer Center, please go directly to the Cancer Center and check in at the registration area.   Wear comfortable clothing and clothing appropriate for easy access to any Portacath or PICC line.   We strive to give you quality time with your provider. You may need to reschedule your appointment if you arrive late (15 or more minutes).  Arriving late affects you and other patients whose appointments are after yours.  Also, if you miss three or more appointments without notifying the office, you may be dismissed from the clinic at the provider's discretion.      For prescription refill requests, have your pharmacy contact our office and allow 72 hours for refills to be completed.    Today you received the following chemotherapy and/or immunotherapy agents :  Topetecan      To help prevent nausea and vomiting after your treatment, we encourage you to take your nausea medication as directed.  BELOW ARE SYMPTOMS THAT SHOULD BE REPORTED IMMEDIATELY: *FEVER GREATER THAN 100.4 F (38 C) OR HIGHER *CHILLS OR SWEATING *NAUSEA AND VOMITING THAT IS NOT CONTROLLED WITH YOUR NAUSEA MEDICATION *UNUSUAL SHORTNESS OF BREATH *UNUSUAL BRUISING OR BLEEDING *URINARY PROBLEMS (pain or burning when urinating, or frequent urination) *BOWEL PROBLEMS (unusual diarrhea, constipation, pain near the anus) TENDERNESS IN MOUTH AND THROAT WITH OR WITHOUT PRESENCE OF ULCERS (sore throat, sores in mouth, or a toothache) UNUSUAL RASH, SWELLING OR PAIN  UNUSUAL VAGINAL DISCHARGE OR ITCHING   Items with * indicate a potential emergency and should be followed up as soon as possible or go to the Emergency Department if any problems should occur.  Please show the CHEMOTHERAPY ALERT CARD or  IMMUNOTHERAPY ALERT CARD at check-in to the Emergency Department and triage nurse.  Should you have questions after your visit or need to cancel or reschedule your appointment, please contact [facility]  Dept: [phone]  and follow the prompts.  Office hours are 8:00 a.m. to 4:30 p.m. Monday - Friday. Please note that voicemails left after 4:00 p.m. may not be returned until the following business day.  We are closed weekends and major holidays. You have access to a nurse at all times for urgent questions. Please call the main number to the clinic Dept: [phone] and follow the prompts.   For any non-urgent questions, you may also contact your provider using MyChart. We now offer e-Visits for anyone 22 and older to request care online for non-urgent symptoms. For details visit [email or web address].   Also download the MyChart app! Go to the app store, search MyChart, open the app, select [facility], and log in with your MyChart username and password.

## 2024-05-26 ENCOUNTER — Inpatient Hospital Stay: Payer: PRIVATE HEALTH INSURANCE

## 2024-05-26 ENCOUNTER — Encounter: Payer: Self-pay | Admitting: Nurse Practitioner

## 2024-05-26 ENCOUNTER — Inpatient Hospital Stay: Payer: PRIVATE HEALTH INSURANCE | Admitting: Nurse Practitioner

## 2024-05-26 ENCOUNTER — Encounter: Payer: Self-pay | Admitting: Internal Medicine

## 2024-05-26 VITALS — BP 107/78 | HR 99 | Temp 97.6°F | Resp 17

## 2024-05-26 DIAGNOSIS — R530 Neoplastic (malignant) related fatigue: Secondary | ICD-10-CM

## 2024-05-26 DIAGNOSIS — Z515 Encounter for palliative care: Secondary | ICD-10-CM

## 2024-05-26 DIAGNOSIS — C7951 Secondary malignant neoplasm of bone: Secondary | ICD-10-CM | POA: Diagnosis not present

## 2024-05-26 DIAGNOSIS — D649 Anemia, unspecified: Secondary | ICD-10-CM | POA: Diagnosis not present

## 2024-05-26 DIAGNOSIS — C3411 Malignant neoplasm of upper lobe, right bronchus or lung: Secondary | ICD-10-CM

## 2024-05-26 DIAGNOSIS — Z5111 Encounter for antineoplastic chemotherapy: Secondary | ICD-10-CM | POA: Diagnosis not present

## 2024-05-26 DIAGNOSIS — G893 Neoplasm related pain (acute) (chronic): Secondary | ICD-10-CM

## 2024-05-26 MED ORDER — SODIUM CHLORIDE 0.9 % IV SOLN: INTRAVENOUS | Status: DC

## 2024-05-26 MED ORDER — TOPOTECAN HCL CHEMO INJECTION 4 MG/4ML
1.0000 mg/m2 | Freq: Once | INTRAVENOUS | Status: AC
  Administered 2024-05-26: 2.4 mg via INTRAVENOUS
  Filled 2024-05-26: qty 2.4

## 2024-05-26 MED ORDER — PROCHLORPERAZINE MALEATE 10 MG PO TABS
10.0000 mg | ORAL_TABLET | Freq: Once | ORAL | Status: AC
  Administered 2024-05-26: 10 mg via ORAL
  Filled 2024-05-26: qty 1

## 2024-05-26 MED ORDER — TRILACICLIB DIHYDROCHLORIDE INJECTION 300 MG
240.0000 mg/m2 | Freq: Once | INTRAVENOUS | Status: AC
  Administered 2024-05-26: 600 mg via INTRAVENOUS
  Filled 2024-05-26: qty 40

## 2024-05-26 NOTE — Progress Notes (Signed)
 Palliative Medicine [facility]  [phone] [phone]) [phone]   Name: Adam Wise Date: 05/26/2024 MRN: [id number]  DOB: 02/14/1951  Patient Care Team: Gabriel John, NP as PCP - General (Internal Medicine) Arleen Lacer, MD as PCP - Cardiology (Cardiology) Jaye Mettle, RN as Oncology Nurse Navigator Pickenpack-Cousar, Giles Labrum, NP as Nurse Practitioner [facility] and Palliative Medicine)    INTERVAL HISTORY: Adam Wise is a 73 y.o. 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 Palliative ask to see for symptom management and goals of care.   SOCIAL HISTORY:     reports that he has quit smoking. He has never used smokeless tobacco. He reports that he does not currently use alcohol. He reports that he does not currently use drugs.  ADVANCE DIRECTIVES:  Advanced directives on file naming Adam Wise, Adam Wise, Adam Wise, and Adam Wise as decision makers for this pt should Mr.Fick become unable to speak for himself.   CODE STATUS: Full code  PAST MEDICAL HISTORY: Past Medical History:  Diagnosis Date   Acute on chronic respiratory failure with hypoxia (HCC) 10/12/2021   Chickenpox    Chronic knee pain    Chronic low back pain    COPD exacerbation (HCC) 10/11/2021   COPD with exacerbation (HCC) 10/12/2021   Coronary artery calcification seen on CAT scan 11/2021   Coronary CTA 11/27/2021: Moderate to large right pleural effusion and compressive atelectasis right lung base. => Coronary Calcium Score 657.  Diffuse RCA calcification.  Minimal mild disease in the LAD and diagonal branches. == Overall limited study.  Notable artifact.   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 He has disease progression after the first line of chemotherapy in December 2021.   Testosterone  deficiency    Type 2 diabetes mellitus (HCC)     ALLERGIES:  is allergic to bupropion and hydrochlorothiazide.  MEDICATIONS:  Current Outpatient Medications  Medication Sig Dispense Refill   albuterol  (VENTOLIN  HFA) 108 (90 Base) MCG/ACT inhaler INHALE 2 PUFFS INTO THE LUNGS EVERY 4 (FOUR) HOURS AS NEEDED FOR SHORTNESS OF BREATH 18 each 0   amLODipine  (NORVASC ) 10 MG tablet Take 0.5 tablets (5 mg total) by mouth daily. TAKE 1 TABLET BY MOUTH EVERY DAY for blood pressure.     B-D 3CC LUER-LOK SYR 22GX1 22G X 1 3 ML MISC USE AS INSTRUCTED FOR TESTOSTERONE  INJECTION EVERY 2 WEEKS 10 each 2   Black Pepper-Turmeric (TURMERIC COMPLEX/BLACK PEPPER PO) Take 1 tablet by mouth in the morning and at bedtime.     Coenzyme Q10 (COQ10) 200 MG CAPS Take 200 mg by mouth at bedtime.     cyclobenzaprine  (FLEXERIL ) 5 MG tablet TAKE 1 TABLET BY MOUTH AT BEDTIME AS NEEDED FOR MUSCLE SPASMS. 90 tablet 0   docusate sodium  (COLACE) 100 MG capsule Take 1 capsule (100 mg total) by mouth every 12 (twelve) hours. 60 capsule 0   ELIQUIS  5 MG TABS tablet TAKE 1 TABLET BY MOUTH TWICE A DAY 180 tablet 2  esomeprazole (NEXIUM) 20 MG capsule Take 20 mg by mouth in the morning.     gabapentin  (NEURONTIN ) 300 MG capsule Take 1 capsule (300 mg total) by mouth 2 (two) times daily. For back pain. 180 capsule 1   glucose blood (ONETOUCH ULTRA) test strip USE UP TO 4 TIMES DAILY AS DIRECTED 400 strip 5   Insulin  Pen Needle (BD PEN NEEDLE NANO U/F) 32G X 4 MM MISC Use with insulin  as prescribed Dx Code: E11.9 100 each 3   ipratropium-albuterol  (DUONEB) 0.5-2.5 (3) MG/3ML SOLN Take 3 mLs by  nebulization every 4 (four) hours while awake for 3 days, THEN 3 mLs every 4 (four) hours as needed (shortness of breath or wheezing). 360 mL 0   KRILL OIL PO Take 1 capsule by mouth daily.     Lancets MISC USE UP TO 3 TIMES DAILY AS DIRECTED 100 each 2   lidocaine  (LIDODERM ) 5 % Place 1 patch onto the skin daily. Remove & Discard patch within 12 hours or as directed by MD 30 patch 0   losartan  (COZAAR ) 50 MG tablet TAKE 1 TABLET (50 MG TOTAL) BY MOUTH DAILY. FOR BLOOD PRESSURE. 90 tablet 3   Melatonin 10 MG CAPS Take 10 mg by mouth at bedtime as needed (for sleep).     metFORMIN  (GLUCOPHAGE ) 1000 MG tablet TAKE 1 TABLET (1,000 MG TOTAL) BY MOUTH 2 (TWO) TIMES DAILY WITH A MEAL. FOR DIABETES 180 tablet 1   mirtazapine  (REMERON ) 15 MG tablet TAKE 1 TABLET BY MOUTH EVERYDAY AT BEDTIME 90 tablet 1   morphine  (MS CONTIN ) 30 MG 12 hr tablet Take 1 tablet (30 mg total) by mouth every 8 (eight) hours. 90 tablet 0   Multiple Vitamin (MULTI-VITAMINS) TABS Take 1 tablet by mouth daily with breakfast.     Naloxone  HCl 3 MG/0.1ML LIQD Place 1 spray into both nostrils once. For known/suspected opiod overdose. Every 2-3 minutes in alternating nostril till EMS arrives.     nystatin  (MYCOSTATIN ) 100000 UNIT/ML suspension Take 5 mLs (500,000 Units total) by mouth 4 (four) times daily. 60 mL 0   ondansetron  (ZOFRAN ) 8 MG tablet Take 0.5 tablets (4 mg total) by mouth every 8 (eight) hours as needed for nausea or vomiting. 45 tablet 3   oxyCODONE -acetaminophen  (PERCOCET) 10-325 MG tablet Take 1 tablet by mouth every 4 (four) hours as needed. 60 tablet 0   pioglitazone  (ACTOS ) 45 MG tablet TAKE 1 TABLET (45 MG TOTAL) BY MOUTH DAILY. FOR DIABETES. 90 tablet 1   polyethylene glycol powder (GLYCOLAX /MIRALAX ) 17 GM/SCOOP powder Take by mouth once.     pravastatin  (PRAVACHOL ) 40 MG tablet TAKE 1 TABLET BY MOUTH EVERY DAY IN THE EVENING FOR CHOLESTEROL 90 tablet 3   PRESCRIPTION MEDICATION CPAP- At bedtime     testosterone   cypionate (DEPOTESTOSTERONE CYPIONATE) 200 MG/ML injection Inject 200 mg into the muscle every 14 (fourteen) days.     No current facility-administered medications for this visit.   Facility-Administered Medications Ordered in Other Visits  Medication Dose Route Frequency Provider Last Rate Last Admin   0.9 %  sodium chloride  infusion   Intravenous Continuous Marlene Simas, MD 10 mL/hr at 05/26/24 1336 New Bag at 05/26/24 1336   heparin  lock flush 100 unit/mL  500 Units Intracatheter Once Marlene Simas, MD       sodium chloride  flush (NS) 0.9 % injection 10 mL  10 mL Intracatheter Once Mohamed, Mohamed, MD       topotecan  HCl (HYCAMTIN ) 2.4 mg in sodium  chloride 0.9 % 100 mL chemo infusion  1 mg/m2 (Treatment Plan Recorded) Intravenous Once Mohamed, Mohamed, MD       trilaciclib dihydrochloride  (COSELA ) 600 mg in dextrose  5 % 250 mL (2.069 mg/mL) infusion  240 mg/m2 (Treatment Plan Recorded) Intravenous Once Mohamed, Mohamed, MD 580 mL/hr at 05/26/24 1434 600 mg at 05/26/24 1434    VITAL SIGNS: There were no vitals taken for this visit. There were no vitals filed for this visit.  Estimated body mass index is 36.31 kg/m as calculated from the following:   Height as of 04/26/24: 5' 9 (1.753 m).   Weight as of 05/23/24: 245 lb 14.4 oz (111.5 kg).   PERFORMANCE STATUS (ECOG) : 1 - Symptomatic but completely ambulatory   Physical Exam General: NAD Cardiovascular: regular rate and rhythm Pulmonary: normal breathing pattern Extremities: no edema, no joint deformities Skin: no rashes Neurological: AAO x3  IMPRESSION: Discussed the use of AI scribe software for clinical note transcription with the patient, who gave verbal consent to proceed.  History of Present Illness Tien Spooner is a 73 year old male with lung cancer was seen during his infusion for symptom management follow-up.  Denies concerns of nausea, vomiting, constipation, or diarrhea. Is remaining as active as  possible.   Mr. Plata reports his pain has not been controlled as previous times. Reports pain was in his lower back originally now experiencing upper back and rib pain post radiation. He is hopeful for some decrease in the upcoming days. Reports similar response with previous radiation treatments. We discussed his regimen at length. He is currently taking MS Contin  every 12 hours we discussed use of MS Contin  every 8 hours as discussed in the ED on 5/13. Encouraged patient to adjust to hopefully offer better pain management. He has Percocet available as needed however patient reports he does not like taking and may take at the most once or twice daily. Education provided on pain management and medication with ability to decrease use as pain improves. He verbalized understanding expressing he would consider noting he doesn't care to take medication.   All questions answered and support provided.   I discussed the importance of continued conversation with family and their medical providers regarding overall plan of care and treatment options, ensuring decisions are within the context of the patients values and GOCs.  Assessment & Plan Cancer Related Pain  Patient reports back pain is well controlled.  - Continue current pain management regimen with Tylenol  as needed. - Use Percocet sparingly for breakthrough pain. -Continue MS Contin  30 mg every 8 hours -Miralax  daily for bowel regimen   Follow-up Follow-up planned in a couple of weeks unless needed sooner. - Schedule follow-up in 3-4 weeks. Sooner if needed.   Patient expressed understanding and was in agreement with this plan. He also understands that He can call the clinic at any time with any questions, concerns, or complaints.   Any controlled substances utilized were prescribed in the context of palliative care. PDMP has been reviewed.   Visit consisted of counseling and education dealing with the complex and emotionally intense issues  of symptom management and palliative care in the setting of serious and potentially life-threatening illness.  Dellia Ferguson, AGPCNP-BC  Palliative Medicine Team/[facility]

## 2024-05-27 ENCOUNTER — Encounter: Payer: Self-pay | Admitting: Radiation Oncology

## 2024-05-27 ENCOUNTER — Inpatient Hospital Stay: Payer: PRIVATE HEALTH INSURANCE

## 2024-05-27 ENCOUNTER — Encounter: Payer: Self-pay | Admitting: Internal Medicine

## 2024-05-27 VITALS — BP 104/70 | HR 97 | Temp 98.3°F | Resp 18

## 2024-05-27 DIAGNOSIS — Z5111 Encounter for antineoplastic chemotherapy: Secondary | ICD-10-CM | POA: Diagnosis not present

## 2024-05-27 DIAGNOSIS — G893 Neoplasm related pain (acute) (chronic): Secondary | ICD-10-CM | POA: Diagnosis not present

## 2024-05-27 DIAGNOSIS — Z515 Encounter for palliative care: Secondary | ICD-10-CM | POA: Diagnosis not present

## 2024-05-27 DIAGNOSIS — C3411 Malignant neoplasm of upper lobe, right bronchus or lung: Secondary | ICD-10-CM

## 2024-05-27 DIAGNOSIS — D649 Anemia, unspecified: Secondary | ICD-10-CM | POA: Diagnosis not present

## 2024-05-27 DIAGNOSIS — C7951 Secondary malignant neoplasm of bone: Secondary | ICD-10-CM | POA: Diagnosis not present

## 2024-05-27 MED ORDER — SODIUM CHLORIDE 0.9 % IV SOLN: INTRAVENOUS | Status: DC

## 2024-05-27 MED ORDER — SODIUM CHLORIDE 0.9% FLUSH
10.0000 mL | INTRAVENOUS | Status: DC | PRN
  Administered 2024-05-27: 10 mL

## 2024-05-27 MED ORDER — TRILACICLIB DIHYDROCHLORIDE INJECTION 300 MG
240.0000 mg/m2 | Freq: Once | INTRAVENOUS | Status: AC
  Administered 2024-05-27: 600 mg via INTRAVENOUS
  Filled 2024-05-27: qty 40

## 2024-05-27 MED ORDER — PROCHLORPERAZINE MALEATE 10 MG PO TABS
10.0000 mg | ORAL_TABLET | Freq: Once | ORAL | Status: AC
  Administered 2024-05-27: 10 mg via ORAL
  Filled 2024-05-27: qty 1

## 2024-05-27 MED ORDER — HEPARIN SOD (PORK) LOCK FLUSH 100 UNIT/ML IV SOLN
500.0000 [IU] | Freq: Once | INTRAVENOUS | Status: AC | PRN
  Administered 2024-05-27: 500 [IU]

## 2024-05-27 MED ORDER — TOPOTECAN HCL CHEMO INJECTION 4 MG/4ML
1.0000 mg/m2 | Freq: Once | INTRAVENOUS | Status: AC
  Administered 2024-05-27: 2.4 mg via INTRAVENOUS
  Filled 2024-05-27: qty 2.4

## 2024-05-27 NOTE — Patient Instructions (Signed)
 [facility] - A DEPT OF [facility]  Discharge Instructions: Thank you for choosing [facility] to provide your oncology and hematology care.   If you have a lab appointment with the Cancer Center, please go directly to the Cancer Center and check in at the registration area.   Wear comfortable clothing and clothing appropriate for easy access to any Portacath or PICC line.   We strive to give you quality time with your provider. You may need to reschedule your appointment if you arrive late (15 or more minutes).  Arriving late affects you and other patients whose appointments are after yours.  Also, if you miss three or more appointments without notifying the office, you may be dismissed from the clinic at the provider's discretion.      For prescription refill requests, have your pharmacy contact our office and allow 72 hours for refills to be completed.    Today you received the following chemotherapy and/or immunotherapy agents topotecan      To help prevent nausea and vomiting after your treatment, we encourage you to take your nausea medication as directed.  BELOW ARE SYMPTOMS THAT SHOULD BE REPORTED IMMEDIATELY: *FEVER GREATER THAN 100.4 F (38 C) OR HIGHER *CHILLS OR SWEATING *NAUSEA AND VOMITING THAT IS NOT CONTROLLED WITH YOUR NAUSEA MEDICATION *UNUSUAL SHORTNESS OF BREATH *UNUSUAL BRUISING OR BLEEDING *URINARY PROBLEMS (pain or burning when urinating, or frequent urination) *BOWEL PROBLEMS (unusual diarrhea, constipation, pain near the anus) TENDERNESS IN MOUTH AND THROAT WITH OR WITHOUT PRESENCE OF ULCERS (sore throat, sores in mouth, or a toothache) UNUSUAL RASH, SWELLING OR PAIN  UNUSUAL VAGINAL DISCHARGE OR ITCHING   Items with * indicate a potential emergency and should be followed up as soon as possible or go to the Emergency Department if any problems should occur.  Please show the CHEMOTHERAPY ALERT CARD or IMMUNOTHERAPY  ALERT CARD at check-in to the Emergency Department and triage nurse.  Should you have questions after your visit or need to cancel or reschedule your appointment, please contact [facility] - A DEPT OF [facility]  Dept: [phone]  and follow the prompts.  Office hours are 8:00 a.m. to 4:30 p.m. Monday - Friday. Please note that voicemails left after 4:00 p.m. may not be returned until the following business day.  We are closed weekends and major holidays. You have access to a nurse at all times for urgent questions. Please call the main number to the clinic Dept: [phone] and follow the prompts.   For any non-urgent questions, you may also contact your provider using MyChart. We now offer e-Visits for anyone 13 and older to request care online for non-urgent symptoms. For details visit [email or web address].   Also download the MyChart app! Go to the app store, search "MyChart", open the app, select [place], and log in with your MyChart username and password.

## 2024-05-27 NOTE — Progress Notes (Signed)
 Erroneous.

## 2024-05-27 NOTE — Progress Notes (Signed)
 Telephone nursing appointment for review of most recent MRI-Brain results. I verified patient's identity x2 and began nursing interview.   Patient states issues as follows...   -Fatigue: Moderate -Hair Loss: Denies -Skin: Denies -Weakness: Denies -Loss of control of extremities: Denies -Headache: Denies -Seizure/ uncontrolled movement: Denies -Vision: Denies -Speech: Denies -Confusion: Denies -Dexamethasone / steroids: Denies  Patient denies any other related issues at this time.   Meaningful use complete.   Patient aware of their telephone appointment w/ Allana Ishikawa PA-C. I left my extension [phone] in case patient needs anything. Patient verbalized understanding. This concludes the nursing interview.   Patient preferred phone # [phone]   Patient states he has already received his scan results.    Avery Bodo, LPN

## 2024-05-30 ENCOUNTER — Other Ambulatory Visit: Payer: PRIVATE HEALTH INSURANCE

## 2024-05-30 ENCOUNTER — Inpatient Hospital Stay: Payer: PRIVATE HEALTH INSURANCE

## 2024-05-30 ENCOUNTER — Telehealth: Payer: Self-pay | Admitting: Radiation Oncology

## 2024-05-30 ENCOUNTER — Encounter: Payer: Self-pay | Admitting: Internal Medicine

## 2024-05-30 ENCOUNTER — Inpatient Hospital Stay
Admission: RE | Admit: 2024-05-30 | Discharge: 2024-05-30 | Disposition: A | Discharge status: Home / Self Care | Payer: PRIVATE HEALTH INSURANCE | Source: Ambulatory Visit | Attending: Radiation Oncology | Admitting: Radiation Oncology

## 2024-05-30 ENCOUNTER — Other Ambulatory Visit: Payer: Self-pay | Admitting: Physician Assistant

## 2024-05-30 ENCOUNTER — Other Ambulatory Visit: Payer: Self-pay | Admitting: Nurse Practitioner

## 2024-05-30 DIAGNOSIS — Z515 Encounter for palliative care: Secondary | ICD-10-CM

## 2024-05-30 DIAGNOSIS — Z5111 Encounter for antineoplastic chemotherapy: Secondary | ICD-10-CM | POA: Diagnosis not present

## 2024-05-30 DIAGNOSIS — C3411 Malignant neoplasm of upper lobe, right bronchus or lung: Secondary | ICD-10-CM

## 2024-05-30 DIAGNOSIS — D649 Anemia, unspecified: Secondary | ICD-10-CM | POA: Diagnosis not present

## 2024-05-30 DIAGNOSIS — J9611 Chronic respiratory failure with hypoxia: Secondary | ICD-10-CM

## 2024-05-30 DIAGNOSIS — G893 Neoplasm related pain (acute) (chronic): Secondary | ICD-10-CM | POA: Diagnosis not present

## 2024-05-30 DIAGNOSIS — C3490 Malignant neoplasm of unspecified part of unspecified bronchus or lung: Secondary | ICD-10-CM

## 2024-05-30 DIAGNOSIS — C7951 Secondary malignant neoplasm of bone: Secondary | ICD-10-CM | POA: Diagnosis not present

## 2024-05-30 LAB — CBC WITH DIFFERENTIAL (CANCER CENTER ONLY)
Abs Immature Granulocytes: 0.01 10*3/uL (ref 0.00–0.07)
Basophils Absolute: 0 10*3/uL (ref 0.0–0.1)
Basophils Relative: 1 %
Eosinophils Absolute: 0.1 10*3/uL (ref 0.0–0.5)
Eosinophils Relative: 4 %
HCT: 29.5 % — ABNORMAL LOW (ref 39.0–52.0)
Hemoglobin: 9.6 g/dL — ABNORMAL LOW (ref 13.0–17.0)
Immature Granulocytes: 0 %
Lymphocytes Relative: 6 %
Lymphs Abs: 0.2 10*3/uL — ABNORMAL LOW (ref 0.7–4.0)
MCH: 31.7 pg (ref 26.0–34.0)
MCHC: 32.5 g/dL (ref 30.0–36.0)
MCV: 97.4 fL (ref 80.0–100.0)
Monocytes Absolute: 0.1 10*3/uL (ref 0.1–1.0)
Monocytes Relative: 2 %
Neutro Abs: 2.9 10*3/uL (ref 1.7–7.7)
Neutrophils Relative %: 87 %
Platelet Count: 80 10*3/uL — ABNORMAL LOW (ref 150–400)
RBC: 3.03 MIL/uL — ABNORMAL LOW (ref 4.22–5.81)
RDW: 14.2 % (ref 11.5–15.5)
Smear Review: NORMAL
WBC Count: 3.3 10*3/uL — ABNORMAL LOW (ref 4.0–10.5)
nRBC: 0 % (ref 0.0–0.2)

## 2024-05-30 LAB — CMP (CANCER CENTER ONLY)
ALT: 21 U/L (ref 0–44)
AST: 28 U/L (ref 15–41)
Albumin: 3.8 g/dL (ref 3.5–5.0)
Alkaline Phosphatase: 84 U/L (ref 38–126)
Anion gap: 7 (ref 5–15)
BUN: 19 mg/dL (ref 8–23)
CO2: 29 mmol/L (ref 22–32)
Calcium: 9.1 mg/dL (ref 8.9–10.3)
Chloride: 103 mmol/L (ref 98–111)
Creatinine: 1.13 mg/dL (ref 0.61–1.24)
GFR, Estimated: >60 mL/min (ref >60)
Glucose, Bld: 150 mg/dL — ABNORMAL HIGH (ref 70–99)
Potassium: 4.7 mmol/L (ref 3.5–5.1)
Sodium: 139 mmol/L (ref 135–145)
Total Bilirubin: 0.4 mg/dL (ref 0.0–1.2)
Total Protein: 6.2 g/dL — ABNORMAL LOW (ref 6.5–8.1)

## 2024-05-30 LAB — SAMPLE TO BLOOD BANK

## 2024-05-30 MED ORDER — MORPHINE SULFATE ER 30 MG PO TBCR: 30.0000 mg | EXTENDED_RELEASE_TABLET | Freq: Three times a day (TID) | ORAL | 0 refills | Status: DC

## 2024-05-30 MED ORDER — OXYCODONE-ACETAMINOPHEN 10-325 MG PO TABS: 1.0000 | ORAL_TABLET | ORAL | 0 refills | Status: DC | PRN

## 2024-05-30 MED ORDER — ALBUTEROL SULFATE HFA 108 (90 BASE) MCG/ACT IN AERS
2.0000 | INHALATION_SPRAY | RESPIRATORY_TRACT | 0 refills | Status: DC | PRN
Start: 2024-05-30 — End: 2024-11-02

## 2024-05-30 NOTE — Telephone Encounter (Signed)
 I left another message for the patient trying to reach him about his MRI results.

## 2024-06-01 ENCOUNTER — Other Ambulatory Visit: Payer: Self-pay | Admitting: Physician Assistant

## 2024-06-01 ENCOUNTER — Encounter: Payer: Self-pay | Admitting: Internal Medicine

## 2024-06-06 ENCOUNTER — Other Ambulatory Visit: Payer: Self-pay | Admitting: Medical Oncology

## 2024-06-06 ENCOUNTER — Telehealth: Payer: Self-pay | Admitting: Medical Oncology

## 2024-06-06 ENCOUNTER — Inpatient Hospital Stay

## 2024-06-06 ENCOUNTER — Inpatient Hospital Stay: Payer: PRIVATE HEALTH INSURANCE

## 2024-06-06 DIAGNOSIS — Z5111 Encounter for antineoplastic chemotherapy: Secondary | ICD-10-CM | POA: Diagnosis not present

## 2024-06-06 DIAGNOSIS — C3411 Malignant neoplasm of upper lobe, right bronchus or lung: Secondary | ICD-10-CM

## 2024-06-06 DIAGNOSIS — G893 Neoplasm related pain (acute) (chronic): Secondary | ICD-10-CM | POA: Diagnosis not present

## 2024-06-06 DIAGNOSIS — D696 Thrombocytopenia, unspecified: Secondary | ICD-10-CM

## 2024-06-06 DIAGNOSIS — D649 Anemia, unspecified: Secondary | ICD-10-CM | POA: Diagnosis not present

## 2024-06-06 DIAGNOSIS — C7951 Secondary malignant neoplasm of bone: Secondary | ICD-10-CM | POA: Diagnosis not present

## 2024-06-06 DIAGNOSIS — Z515 Encounter for palliative care: Secondary | ICD-10-CM | POA: Diagnosis not present

## 2024-06-06 LAB — CMP (CANCER CENTER ONLY)
ALT: 23 U/L (ref 0–44)
AST: 29 U/L (ref 15–41)
Albumin: 3.6 g/dL (ref 3.5–5.0)
Alkaline Phosphatase: 87 U/L (ref 38–126)
Anion gap: 8 (ref 5–15)
BUN: 16 mg/dL (ref 8–23)
CO2: 28 mmol/L (ref 22–32)
Calcium: 8.8 mg/dL — ABNORMAL LOW (ref 8.9–10.3)
Chloride: 104 mmol/L (ref 98–111)
Creatinine: 1.05 mg/dL (ref 0.61–1.24)
GFR, Estimated: >60 mL/min (ref >60)
Glucose, Bld: 152 mg/dL — ABNORMAL HIGH (ref 70–99)
Potassium: 4.5 mmol/L (ref 3.5–5.1)
Sodium: 140 mmol/L (ref 135–145)
Total Bilirubin: 0.4 mg/dL (ref 0.0–1.2)
Total Protein: 6 g/dL — ABNORMAL LOW (ref 6.5–8.1)

## 2024-06-06 LAB — CBC WITH DIFFERENTIAL (CANCER CENTER ONLY)
Abs Immature Granulocytes: 0 10*3/uL (ref 0.00–0.07)
Basophils Absolute: 0 10*3/uL (ref 0.0–0.1)
Basophils Relative: 1 %
Eosinophils Absolute: 0 10*3/uL (ref 0.0–0.5)
Eosinophils Relative: 2 %
HCT: 26.4 % — ABNORMAL LOW (ref 39.0–52.0)
Hemoglobin: 8.7 g/dL — ABNORMAL LOW (ref 13.0–17.0)
Immature Granulocytes: 0 %
Lymphocytes Relative: 14 %
Lymphs Abs: 0.2 10*3/uL — ABNORMAL LOW (ref 0.7–4.0)
MCH: 32 pg (ref 26.0–34.0)
MCHC: 33 g/dL (ref 30.0–36.0)
MCV: 97.1 fL (ref 80.0–100.0)
Monocytes Absolute: 0.2 10*3/uL (ref 0.1–1.0)
Monocytes Relative: 17 %
Neutro Abs: 0.9 10*3/uL — ABNORMAL LOW (ref 1.7–7.7)
Neutrophils Relative %: 66 %
Platelet Count: 8 10*3/uL — CL (ref 150–400)
RBC: 2.72 MIL/uL — ABNORMAL LOW (ref 4.22–5.81)
RDW: 14.1 % (ref 11.5–15.5)
WBC Count: 1.3 10*3/uL — ABNORMAL LOW (ref 4.0–10.5)
nRBC: 0 % (ref 0.0–0.2)

## 2024-06-06 LAB — SAMPLE TO BLOOD BANK

## 2024-06-06 MED ORDER — SODIUM CHLORIDE 0.9% FLUSH
10.0000 mL | INTRAVENOUS | Status: AC | PRN
  Administered 2024-06-06: 10 mL

## 2024-06-06 MED ORDER — HEPARIN SOD (PORK) LOCK FLUSH 100 UNIT/ML IV SOLN
500.0000 [IU] | Freq: Every day | INTRAVENOUS | Status: AC | PRN
  Administered 2024-06-06: 500 [IU]

## 2024-06-06 MED ORDER — SODIUM CHLORIDE 0.9% IV SOLUTION
250.0000 mL | INTRAVENOUS | Status: DC
  Administered 2024-06-06: 100 mL via INTRAVENOUS

## 2024-06-06 MED ORDER — ACETAMINOPHEN 325 MG PO TABS
650.0000 mg | ORAL_TABLET | Freq: Once | ORAL | Status: AC
  Administered 2024-06-06: 650 mg via ORAL
  Filled 2024-06-06: qty 2

## 2024-06-06 MED ORDER — DIPHENHYDRAMINE HCL 25 MG PO CAPS
25.0000 mg | ORAL_CAPSULE | Freq: Once | ORAL | Status: AC
  Administered 2024-06-06: 25 mg via ORAL
  Filled 2024-06-06: qty 1

## 2024-06-06 NOTE — Telephone Encounter (Signed)
 Pt confirmed his platelets transfusion appt today at 1400.

## 2024-06-06 NOTE — Patient Instructions (Signed)
Essential Thrombocythemia  Essential thrombocythemia is a condition in which a person has too many platelets (thrombocytes) in the blood. Platelets are parts of blood that stick together and form a clot (thrombus) to help the body stop bleeding after an injury. This condition may also be called primary or essential thrombocytosis. Essential thrombocythemia happens when abnormal cells in the bone marrow (megakaryocytes) make too many platelets. What are the causes? The cause of this condition is not known. What are the signs or symptoms? This condition may not cause any symptoms. If you have symptoms, they may include: Blood clots. Weakness. Headache. Itching. Sweating or fever. Dizziness or confusion. Tingling or burning in your hands or feet. Symptoms may also include bleeding and an enlarged spleen. How is this diagnosed? This condition may be diagnosed based on: A physical exam. Your symptoms. Your medical history. Blood tests. A procedure to collect a sample of your bone marrow (bone marrow aspiration) for testing. How is this treated? If you do not have symptoms, you may not need treatment. Your health care provider may monitor your condition with regular blood tests. If you have symptoms, or if your platelet count is very high, you may be treated with: Aspirin or other medicines to thin your blood and help prevent blood clots. Medicines to reduce the number of platelets in your blood. A procedure to remove some platelets from your blood (plateletpheresis). During this procedure: Your health care provider will place an IV into one of your veins. The IV will be used to draw your blood into a machine that separates out the extra platelets. The blood with reduced platelets will be returned to your body. Follow these instructions at home: Medicines Take over-the-counter and prescription medicines only as told by your health care provider. If you are taking blood thinners: Talk  with your health care provider before you take any medicines that contain aspirin or NSAIDs, such as ibuprofen. These medicines increase your risk for dangerous bleeding. Take your medicine exactly as told, at the same time every day. Avoid activities that could cause injury or bruising, and follow instructions about how to prevent falls. Wear a medical alert bracelet or carry a card that lists what medicines you take. Tell all health care providers, including dental health care providers, about any medicines you are taking to prevent blood clots. General instructions Do not use any products that contain nicotine or tobacco. These products include cigarettes, chewing tobacco, and vaping devices, such as e-cigarettes. If you need help quitting, ask your health care provider. Ask your health care provider about managing or preventing high cholesterol, high blood pressure, and diabetes. These conditions can make essential thrombocythemia worse. Keep all follow-up visits. This is important. Contact a health care provider if: You have severe pain, and medicines do not help. You have problems taking your medicines to prevent blood clots. You have new pain, warmth, swelling, or redness in an arm or leg. This could mean you have a blood clot. You faint. You have unusual bruises. You have bloody or tarry stools. You have pink or bloody urine. Your menstrual periods are heavier than normal, if applicable. You have nosebleeds, bleeding gums, or other bleeding. Get help right away if:  You have chest pain. You have trouble breathing. You have any symptoms of a stroke. "BE FAST" is an easy way to remember the main warning signs of a stroke: B - Balance. Signs are dizziness, sudden trouble walking, or loss of balance. E - Eyes. Signs are  trouble seeing or a sudden change in vision. F - Face. Signs are sudden weakness or numbness of the face, or the face or eyelid drooping on one side. A - Arm. Signs are  weakness or numbness in an arm. This happens suddenly and usually on one side of the body. S - Speech. Signs are sudden trouble speaking, slurred speech, or trouble understanding what people say. T - Time. Time to call emergency services. Write down what time symptoms started. You have other signs of a stroke, such as: A sudden, severe headache with no known cause. Nausea or vomiting. Seizure. These symptoms may represent a serious problem that is an emergency. Do not wait to see if the symptoms will go away. Get medical help right away. Call your local emergency services (911 in the [place] Do not drive yourself to the hospital. Summary Essential thrombocythemia happens when abnormal cells in the bone marrow make too many platelets. If you have symptoms, or if your platelet count is very high, you may need treatment. Treatment can vary and may include medicines to thin the blood and prevent blood clots. Ask your health care provider about how to manage or prevent high cholesterol, high blood pressure, and diabetes. All of these can make essential thrombocythemia worse. Get help right away if you have any symptoms of stroke. This information is not intended to replace advice given to you by your health care provider. Make sure you discuss any questions you have with your health care provider. Document Revised: 06/03/2021 Document Reviewed: 06/03/2021 Elsevier Patient Education  2024 [employer].

## 2024-06-06 NOTE — Telephone Encounter (Signed)
 CRITICAL VALUE STICKER  CRITICAL VALUE:Plts =8k. No bleeding reported.  RECEIVER (on-site recipient of call): Charina Fons, RN DATE & TIME NOTIFIED: 06/06/24 @ 1043  MESSENGER (representative from lab):Pam  MD NOTIFIED: Sherrod Sherrod , MD  TIME OF NOTIFICATION:1045  RESPONSE:  Plates transfusion ordered and Bleeding Precautions given to pt .

## 2024-06-06 NOTE — Progress Notes (Signed)
Platelets ordered  

## 2024-06-07 LAB — PREPARE PLATELET PHERESIS: Unit division: 0

## 2024-06-07 LAB — BPAM PLATELET PHERESIS
Blood Product Expiration Date: 202506262359
ISSUE DATE / TIME: 202506231423
Unit Type and Rh: 7300

## 2024-06-09 ENCOUNTER — Ambulatory Visit (HOSPITAL_COMMUNITY)
Admission: RE | Admit: 2024-06-09 | Discharge: 2024-06-09 | Disposition: A | Discharge status: Home / Self Care | Payer: PRIVATE HEALTH INSURANCE | Source: Ambulatory Visit | Attending: Physician Assistant | Admitting: Physician Assistant

## 2024-06-09 DIAGNOSIS — D649 Anemia, unspecified: Secondary | ICD-10-CM | POA: Diagnosis not present

## 2024-06-09 DIAGNOSIS — R590 Localized enlarged lymph nodes: Secondary | ICD-10-CM | POA: Diagnosis not present

## 2024-06-09 DIAGNOSIS — C787 Secondary malignant neoplasm of liver and intrahepatic bile duct: Secondary | ICD-10-CM | POA: Diagnosis not present

## 2024-06-09 DIAGNOSIS — C3490 Malignant neoplasm of unspecified part of unspecified bronchus or lung: Secondary | ICD-10-CM | POA: Diagnosis not present

## 2024-06-09 DIAGNOSIS — C3411 Malignant neoplasm of upper lobe, right bronchus or lung: Secondary | ICD-10-CM | POA: Diagnosis not present

## 2024-06-09 DIAGNOSIS — N281 Cyst of kidney, acquired: Secondary | ICD-10-CM | POA: Diagnosis not present

## 2024-06-09 MED ORDER — IOHEXOL 300 MG/ML  SOLN
100.0000 mL | Freq: Once | INTRAMUSCULAR | Status: AC | PRN
  Administered 2024-06-09: 100 mL via INTRAVENOUS

## 2024-06-09 MED ORDER — HEPARIN SOD (PORK) LOCK FLUSH 100 UNIT/ML IV SOLN
500.0000 [IU] | Freq: Every day | INTRAVENOUS | Status: AC | PRN
  Administered 2024-06-09: 500 [IU]

## 2024-06-09 MED ORDER — SODIUM CHLORIDE (PF) 0.9 % IJ SOLN
INTRAMUSCULAR | Status: AC
Start: 2024-06-09 — End: 2024-06-09
  Filled 2024-06-09: qty 50

## 2024-06-09 NOTE — Progress Notes (Signed)
  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   Symptoms of fatigue have improved since completing therapy.  Symptoms of skin changes have improved since completing therapy.  Symptoms of nausea or vomiting have improved since completing therapy.  The patient has scheduled follow up with his medical oncologist Dr. Sherrod for ongoing surveillance, and was encouraged to call if he  develops concerns or questions regarding radiation.   This concludes the interaction.  Rosaline Minerva, LPN

## 2024-06-13 ENCOUNTER — Inpatient Hospital Stay: Payer: PRIVATE HEALTH INSURANCE

## 2024-06-13 ENCOUNTER — Inpatient Hospital Stay (HOSPITAL_BASED_OUTPATIENT_CLINIC_OR_DEPARTMENT_OTHER): Payer: PRIVATE HEALTH INSURANCE | Admitting: Internal Medicine

## 2024-06-13 ENCOUNTER — Ambulatory Visit
Admission: RE | Admit: 2024-06-13 | Discharge: 2024-06-13 | Disposition: A | Discharge status: Home / Self Care | Payer: PRIVATE HEALTH INSURANCE | Source: Ambulatory Visit | Attending: Internal Medicine | Admitting: Internal Medicine

## 2024-06-13 ENCOUNTER — Other Ambulatory Visit: Payer: Self-pay | Admitting: Internal Medicine

## 2024-06-13 VITALS — BP 129/71 | HR 107 | Temp 97.5°F | Resp 18 | Ht 69.0 in | Wt 244.6 lb

## 2024-06-13 DIAGNOSIS — C3411 Malignant neoplasm of upper lobe, right bronchus or lung: Secondary | ICD-10-CM

## 2024-06-13 DIAGNOSIS — Z515 Encounter for palliative care: Secondary | ICD-10-CM | POA: Diagnosis not present

## 2024-06-13 DIAGNOSIS — G893 Neoplasm related pain (acute) (chronic): Secondary | ICD-10-CM | POA: Diagnosis not present

## 2024-06-13 DIAGNOSIS — D696 Thrombocytopenia, unspecified: Secondary | ICD-10-CM

## 2024-06-13 DIAGNOSIS — Z95828 Presence of other vascular implants and grafts: Secondary | ICD-10-CM

## 2024-06-13 DIAGNOSIS — Z5111 Encounter for antineoplastic chemotherapy: Secondary | ICD-10-CM | POA: Diagnosis not present

## 2024-06-13 DIAGNOSIS — D649 Anemia, unspecified: Secondary | ICD-10-CM | POA: Diagnosis not present

## 2024-06-13 DIAGNOSIS — C7951 Secondary malignant neoplasm of bone: Secondary | ICD-10-CM | POA: Diagnosis not present

## 2024-06-13 LAB — TYPE AND SCREEN
ABO/RH(D): B POS
Antibody Screen: NEGATIVE

## 2024-06-13 LAB — CBC WITH DIFFERENTIAL (CANCER CENTER ONLY)
Abs Immature Granulocytes: 0.01 10*3/uL (ref 0.00–0.07)
Basophils Absolute: 0 10*3/uL (ref 0.0–0.1)
Basophils Relative: 0 %
Eosinophils Absolute: 0 10*3/uL (ref 0.0–0.5)
Eosinophils Relative: 1 %
HCT: 26.7 % — ABNORMAL LOW (ref 39.0–52.0)
Hemoglobin: 8.6 g/dL — ABNORMAL LOW (ref 13.0–17.0)
Immature Granulocytes: 0 %
Lymphocytes Relative: 9 %
Lymphs Abs: 0.2 10*3/uL — ABNORMAL LOW (ref 0.7–4.0)
MCH: 31.7 pg (ref 26.0–34.0)
MCHC: 32.2 g/dL (ref 30.0–36.0)
MCV: 98.5 fL (ref 80.0–100.0)
Monocytes Absolute: 0.3 10*3/uL (ref 0.1–1.0)
Monocytes Relative: 12 %
Neutro Abs: 1.8 10*3/uL (ref 1.7–7.7)
Neutrophils Relative %: 78 %
Platelet Count: 104 10*3/uL — ABNORMAL LOW (ref 150–400)
RBC: 2.71 MIL/uL — ABNORMAL LOW (ref 4.22–5.81)
RDW: 15.8 % — ABNORMAL HIGH (ref 11.5–15.5)
WBC Count: 2.4 10*3/uL — ABNORMAL LOW (ref 4.0–10.5)
nRBC: 0 % (ref 0.0–0.2)

## 2024-06-13 LAB — CMP (CANCER CENTER ONLY)
ALT: 21 U/L (ref 0–44)
AST: 27 U/L (ref 15–41)
Albumin: 3.5 g/dL (ref 3.5–5.0)
Alkaline Phosphatase: 88 U/L (ref 38–126)
Anion gap: 8 (ref 5–15)
BUN: 15 mg/dL (ref 8–23)
CO2: 29 mmol/L (ref 22–32)
Calcium: 9 mg/dL (ref 8.9–10.3)
Chloride: 103 mmol/L (ref 98–111)
Creatinine: 0.95 mg/dL (ref 0.61–1.24)
GFR, Estimated: >60 mL/min (ref >60)
Glucose, Bld: 172 mg/dL — ABNORMAL HIGH (ref 70–99)
Potassium: 4 mmol/L (ref 3.5–5.1)
Sodium: 140 mmol/L (ref 135–145)
Total Bilirubin: 0.4 mg/dL (ref 0.0–1.2)
Total Protein: 5.9 g/dL — ABNORMAL LOW (ref 6.5–8.1)

## 2024-06-13 LAB — SAMPLE TO BLOOD BANK

## 2024-06-13 MED ORDER — SODIUM CHLORIDE 0.9% FLUSH
10.0000 mL | Freq: Once | INTRAVENOUS | Status: AC
  Administered 2024-06-13: 10 mL

## 2024-06-13 NOTE — Progress Notes (Signed)
 [facility] [phone]   [phone]  OFFICE PROGRESS NOTE  [name], NP [address] [name] [address]  DIAGNOSIS: Relapsed extensive stage (T4, N3, M1c)  small cell lung cancer diagnosed in April 2021 and presented with large right upper lobe/right hilar mass with ipsilateral and contralateral mediastinal and right supraclavicular lymphadenopathy in addition to multiple liver lesions. He has disease progression after the first line of chemotherapy in December 2021.  PRIOR THERAPY: 1) Palliative radiotherapy to the right upper lobe lung mass under the care of Dr. Dewey. 2) Systemic chemotherapy with carboplatin  for AUC of 5 on day 1, etoposide  100 mg/M2 on days 1, 2 and 3 in addition to Imfinzi  1500 mg IV every 3 weeks with chemotherapy then every 4 weeks for maintenance if the patient has no evidence for progression.  He will also receive Cosela  240 mg/m2 on the days of the chemotherapy.  Status post 9 cycles.  Starting from cycle #5 the patient will be on maintenance treatment with immunotherapy with Imfinzi  1500 mg IV every 4 weeks. Last dose of chemotherapy was given on November 13, 2020. This treatment was discontinued secondary to disease progression 3) Systemic chemotherapy with carboplatin  for AUC of 5 on day 1, etoposide  100 mg/M2 on days 1, 2 and 3, Tecentriq  1200 mg IV every 3 weeks as well as Cosela  250 mg/M2 on the days of the chemotherapy every 3 weeks.  First dose December 18, 2020.  Status post 8 cycles. 4) Zepzelca  (lurbinectedin ) 3.2 mgm2 IV every 3 weeks. Last dose on 01/23/22. Status post 12 cycles.  5) Palliative systemic chemotherapy with irinotecan  65 mg/m2 on days 1 and 8 IV every 3 weeks.  Status post 3 cycles.  Last dose was given April 01, 2022 discontinued secondary to disease progression. 6) SBRT to the progressive liver lesions under the care of Dr. Dewey.  Last fraction January 27, 2023. 7) systemic chemotherapy  with carboplatin  for AUC of 5 on day 1, etoposide  100 Mg/M2 on days 1, 2 and 3 with Cosela  before the chemotherapy.  First dose expected to start on 05/28/2022.  Status post 15 cycles.  Starting from cycle #5 his carboplatin  will be reduced to AUC of 4 and 2 etoposide  80 Mg/M2.  Last dose was giving April 07, 2023 discontinued for suspicious disease progression. 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  First dose January 04, 2024.  Status post 6  cycle.  INTERVAL HISTORY: Bannon Giammarco 73 y.o. male returns to the clinic today for follow-up visit accompanied by his wife.Discussed the use of AI scribe software for clinical note transcription with the patient, who gave verbal consent to proceed.  History of Present Illness   Kendall Justo is a 73 year old male with extensive stage small cell lung cancer who presents for evaluation before starting cycle number seven of chemotherapy.  He was diagnosed with extensive stage small cell lung cancer in December 2021 and has undergone several treatments. He is currently on a reduced dose of Topotecan , administered four to five days every three weeks. He has completed six cycles of this treatment regimen and is here for evaluation before starting the seventh cycle, with a repeat CT scan of the chest, abdomen, and pelvis for restaging of his disease.  He experiences significant fatigue, stating 'I can't stay awake,' and mentions that his wife has difficulty waking him up. No chest pain, nausea, or vomiting, but he notes experiencing shortness of breath again. He is uncertain if this is due to fluid accumulation.  His current medication includes dobutycin, administered in cycles as mentioned. He has a history of fluctuating blood counts, with a recent improvement in his white blood count, although  it remains on the lower side.        MEDICAL HISTORY: Past Medical History:  Diagnosis Date   Acute on chronic respiratory failure with hypoxia (HCC) 10/12/2021   Chickenpox    Chronic knee pain    Chronic low back pain    COPD exacerbation (HCC) 10/11/2021   COPD with exacerbation (HCC) 10/12/2021   Coronary artery calcification seen on CAT scan 11/2021   Coronary CTA 11/27/2021: Moderate to large right pleural effusion and compressive atelectasis right lung base. => Coronary Calcium Score 657.  Diffuse RCA calcification.  Minimal mild disease in the LAD and diagonal branches. == Overall limited study.  Notable artifact.   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 He has disease progression after the first line of chemotherapy in December 2021.   Testosterone  deficiency    Type 2 diabetes mellitus (HCC)     ALLERGIES:  is allergic to bupropion and hydrochlorothiazide.  MEDICATIONS:  Current Outpatient Medications  Medication Sig Dispense Refill   albuterol  (VENTOLIN  HFA) 108 (90 Base) MCG/ACT inhaler Inhale 2 puffs into the lungs every 4 (four) hours as needed for shortness of breath. 18 each 0   amLODipine  (NORVASC ) 10 MG tablet Take 0.5 tablets (5 mg total) by mouth daily. TAKE 1 TABLET BY MOUTH EVERY DAY for blood pressure.     B-D 3CC LUER-LOK SYR 22GX1 22G X 1 3 ML MISC USE AS INSTRUCTED FOR TESTOSTERONE  INJECTION EVERY 2 WEEKS 10 each 2   Black Pepper-Turmeric (TURMERIC COMPLEX/BLACK PEPPER PO) Take 1 tablet by mouth in the morning and at bedtime.     Coenzyme Q10 (COQ10)  200 MG CAPS Take 200 mg by mouth at bedtime.     cyclobenzaprine  (FLEXERIL ) 5 MG tablet TAKE 1 TABLET BY MOUTH AT BEDTIME AS NEEDED FOR MUSCLE SPASMS. 90 tablet 0   docusate sodium  (COLACE) 100 MG capsule Take 1 capsule (100 mg total) by mouth every 12 (twelve) hours. 60 capsule 0   ELIQUIS  5 MG TABS tablet TAKE 1 TABLET BY MOUTH TWICE A DAY 180 tablet 2   esomeprazole (NEXIUM) 20 MG capsule Take 20 mg by mouth in the morning.     gabapentin  (NEURONTIN ) 300 MG capsule Take 1 capsule (300 mg total) by mouth 2 (two) times daily. For back pain. 180 capsule 1   glucose blood (ONETOUCH ULTRA) test strip USE UP TO 4 TIMES DAILY AS DIRECTED 400 strip 5   Insulin  Pen Needle (BD PEN NEEDLE NANO U/F) 32G X 4 MM MISC Use with insulin  as prescribed Dx Code: E11.9 100 each 3   ipratropium-albuterol  (DUONEB) 0.5-2.5 (3) MG/3ML SOLN Take 3 mLs by nebulization every 4 (four) hours  while awake for 3 days, THEN 3 mLs every 4 (four) hours as needed (shortness of breath or wheezing). 360 mL 0   KRILL OIL PO Take 1 capsule by mouth daily.     Lancets MISC USE UP TO 3 TIMES DAILY AS DIRECTED 100 each 2   lidocaine  (LIDODERM ) 5 % Place 1 patch onto the skin daily. Remove & Discard patch within 12 hours or as directed by MD 30 patch 0   losartan  (COZAAR ) 50 MG tablet TAKE 1 TABLET (50 MG TOTAL) BY MOUTH DAILY. FOR BLOOD PRESSURE. 90 tablet 3   Melatonin 10 MG CAPS Take 10 mg by mouth at bedtime as needed (for sleep).     metFORMIN  (GLUCOPHAGE ) 1000 MG tablet TAKE 1 TABLET (1,000 MG TOTAL) BY MOUTH 2 (TWO) TIMES DAILY WITH A MEAL. FOR DIABETES 180 tablet 1   mirtazapine  (REMERON ) 15 MG tablet TAKE 1 TABLET BY MOUTH EVERYDAY AT BEDTIME 90 tablet 1   morphine  (MS CONTIN ) 30 MG 12 hr tablet Take 1 tablet (30 mg total) by mouth every 8 (eight) hours. 90 tablet 0   Multiple Vitamin (MULTI-VITAMINS) TABS Take 1 tablet by mouth daily with breakfast.     Naloxone  HCl 3 MG/0.1ML LIQD Place 1 spray into both nostrils once. For  known/suspected opiod overdose. Every 2-3 minutes in alternating nostril till EMS arrives.     nystatin  (MYCOSTATIN ) 100000 UNIT/ML suspension Take 5 mLs (500,000 Units total) by mouth 4 (four) times daily. 60 mL 0   ondansetron  (ZOFRAN ) 8 MG tablet Take 0.5 tablets (4 mg total) by mouth every 8 (eight) hours as needed for nausea or vomiting. 45 tablet 3   oxyCODONE -acetaminophen  (PERCOCET) 10-325 MG tablet Take 1 tablet by mouth every 4 (four) hours as needed. 60 tablet 0   pioglitazone  (ACTOS ) 45 MG tablet TAKE 1 TABLET (45 MG TOTAL) BY MOUTH DAILY. FOR DIABETES. 90 tablet 1   polyethylene glycol powder (GLYCOLAX /MIRALAX ) 17 GM/SCOOP powder Take by mouth once.     pravastatin  (PRAVACHOL ) 40 MG tablet TAKE 1 TABLET BY MOUTH EVERY DAY IN THE EVENING FOR CHOLESTEROL 90 tablet 3   PRESCRIPTION MEDICATION CPAP- At bedtime     testosterone  cypionate (DEPOTESTOSTERONE CYPIONATE) 200 MG/ML injection Inject 200 mg into the muscle every 14 (fourteen) days.     No current facility-administered medications for this visit.   Facility-Administered Medications Ordered in Other Visits  Medication Dose Route Frequency Provider Last Rate Last Admin   heparin  lock flush 100 unit/mL  500 Units Intracatheter Once Sherrod Sherrod, MD       sodium chloride  flush (NS) 0.9 % injection 10 mL  10 mL Intracatheter Once Sherrod Sherrod, MD        SURGICAL HISTORY:  Past Surgical History:  Procedure Laterality Date   CHEST TUBE INSERTION Right 01/20/2022   Procedure: CHEST TUBE INSERTION;  Surgeon: Claudene Toribio BROCKS, MD;  Location[facility] ENDOSCOPY;  Service: Pulmonary;  Laterality: Right;  w/ Talc  Pleurodesis, planned admit for Obs afterwards   CHEST TUBE INSERTION Right 04/28/2022   Procedure: INSERTION PLEURAL DRAINAGE CATHETER - Pigtail tail, drainage;  Surgeon: Claudene Toribio BROCKS, MD;  Location: [facility] ENDOSCOPY;  Service: Pulmonary;  Laterality: Right;  talc  pleurodesis   CHEST TUBE INSERTION Right 09/01/2022   Procedure:  INSERTION PLEURAL DRAINAGE CATHETER;  Surgeon: Alaine Vicenta NOVAK, MD;  Location: [facility] ENDOSCOPY;  Service: Cardiopulmonary;  Laterality: Right;  pigtail catheter placement with talc  pleurodesis   COLONOSCOPY WITH PROPOFOL  N/A 12/17/2018   Procedure: COLONOSCOPY  WITH PROPOFOL ;  Surgeon: Janalyn Keene NOVAK, MD;  Location: [facility] ENDOSCOPY;  Service: Gastroenterology;  Laterality: N/A;   IR IMAGING GUIDED PORT INSERTION  04/17/2020   IR THORACENTESIS ASP PLEURAL SPACE W/IMG GUIDE  11/29/2021   IR THORACENTESIS ASP PLEURAL SPACE W/IMG GUIDE  01/03/2022   JOINT REPLACEMENT Bilateral    REPLACEMENT TOTAL KNEE BILATERAL  2015   TALC  PLEURODESIS  09/01/2022   Procedure: TALC  PLEURADESIS;  Surgeon: Alaine Vicenta NOVAK, MD;  Location: [facility] ENDOSCOPY;  Service: Cardiopulmonary;;   TONSILLECTOMY  1960   TRANSTHORACIC ECHOCARDIOGRAM  05/2020   a) 05/2020: EF 55 to 60%.  No R WMA.  Mild LVH.  Indeterminate LVEDP.  Unable to assess RVP.  Normal aortic and mitral valves.  Mildly elevated RAP.; b) 09/2021: EF 50-55%. No RWMA. Mild LVH. ~ LVEDP. Mild LA Dilation. NORMAL RV/RAP.  Normal MV/AoV.    REVIEW OF SYSTEMS:  Constitutional: positive for fatigue Eyes: negative Ears, nose, mouth, throat, and face: negative Respiratory: positive for dyspnea on exertion Cardiovascular: negative Gastrointestinal: negative Genitourinary:negative Integument/breast: negative Hematologic/lymphatic: negative Musculoskeletal:negative Neurological: negative Behavioral/Psych: negative Endocrine: negative Allergic/Immunologic: negative   PHYSICAL EXAMINATION: General appearance: alert, cooperative, fatigued, and no distress Head: Normocephalic, without obvious abnormality, atraumatic Neck: no adenopathy, no JVD, supple, symmetrical, trachea midline, and thyroid  not enlarged, symmetric, no tenderness/mass/nodules Lymph nodes: Cervical, supraclavicular, and axillary nodes normal. Resp: clear to auscultation bilaterally Back:  symmetric, no curvature. ROM normal. No CVA tenderness. Cardio: regular rate and rhythm, S1, S2 normal, no murmur, click, rub or gallop GI: soft, non-tender; bowel sounds normal; no masses,  no organomegaly Extremities: extremities normal, atraumatic, no cyanosis or edema Neurologic: Alert and oriented X 3, normal strength and tone. Normal symmetric reflexes. Normal coordination and gait  ECOG PERFORMANCE STATUS: 1 - Symptomatic but completely ambulatory  Blood pressure 129/71, pulse (!) 107, temperature (!) 97.5 F (36.4 C), temperature source Temporal, resp. rate 18, height 5' 9 (1.753 m), weight 244 lb 9.6 oz (110.9 kg), SpO2 95%.   LABORATORY DATA: Lab Results  Component Value Date   WBC 2.4 (L) 06/13/2024   HGB 8.6 (L) 06/13/2024   HCT 26.7 (L) 06/13/2024   MCV 98.5 06/13/2024   PLT 104 (L) 06/13/2024      Chemistry      Component Value Date/Time   NA 140 06/06/2024 1015   K 4.5 06/06/2024 1015   CL 104 06/06/2024 1015   CO2 28 06/06/2024 1015   BUN 16 06/06/2024 1015   CREATININE 1.05 06/06/2024 1015   CREATININE 1.45 (H) 10/18/2021 1533      Component Value Date/Time   CALCIUM 8.8 (L) 06/06/2024 1015   ALKPHOS 87 06/06/2024 1015   AST 29 06/06/2024 1015   ALT 23 06/06/2024 1015   BILITOT 0.4 06/06/2024 1015       RADIOGRAPHIC STUDIES:    ASSESSMENT AND PLAN: This is a very pleasant 73 year old Caucasian male diagnosed with extensive stage (T4, N3, M1C) small cell lung cancer presented with large right upper lobe lung mass in addition to mediastinal and right supraclavicular lymphadenopathy and multiple metastatic liver lesions diagnosed in April 2021. The patient initially underwent systemic chemotherapy with carboplatin  for an AUC of 5 on day 1, etoposide  100 mg per metered squared on days 1, 2, and 3 in addition to Cosela  for myeloprotection.  He also received immunotherapy with Imfinzi  on day 1 of every chemotherapy cycle.  He is status post 9 cycles.   Starting from cycle #5 he  was on single agent immunotherapy with Imfinzi  IV every 4 weeks.  The patient had evidence of disease progression on his scan from December 2021.  He was then started on systemic chemotherapy with carboplatin  for an AUC of 5 on day 1, etoposide  100 mg per metered squared on days 1, 2, and 3 in addition to Tecentriq  1200 mg on day 1, the patient is status post 9 cycles.  Starting from cycle #5, the patient started maintenance immunotherapy with Tecentriq .  This was discontinued due to evidence of disease progression. The patient started third line treatment with Zepzelca  (lurbinectedin ) 3.2 Mg/M2 every 3-week status post 12 cycles of this treatment and he has been tolerating it fairly well except for fatigue.  This treatment was discontinued secondary to disease progression. He underwent fourth line treatment with systemic chemotherapy with irinotecan  65 Mg/M2 on days 1 and 8 every 3 weeks.  Status post 3 cycles.  He tolerated this treatment well with no concerning adverse effect except for mild fatigue. Unfortunately his scan showed evidence for disease progression especially in the liver. I recommended for the patient to discontinue his current treatment with irinotecan  at this point. He started systemic chemotherapy again with carboplatin  for AUC of 5 on day 1, etoposide  100 Mg/M2 on days 1, 2 and 3 with Cosela  status post 15 cycles. I did reduce the dose of carboplatin  to AUC of 4 and etoposide  80 Mg/M2 starting from cycle #5.  He has been tolerating this treatment fairly well with no concerning complaints except for the increasing fatigue and weakness as well as the chemotherapy-induced pancytopenia. He had repeat CT scan of the chest, abdomen and pelvis performed recently.  I personally and independently reviewed the scan images and discussed the result and showed the images to the patient and his wife.  His scan showed redemonstration of the posttreatment/postradiation  appearance of the right chest with dense fibrosis and consolidation of the perihilar right lung as well as a small loculated appearing right pleural effusion with associated pleural densities suggesting prior talc  pleurodesis.  There was significantly increased areas of masslike consolidation in the right lung for example in the peripheral right upper lobe and superior segment of the right lower lobe with extensive new associated nodularity concerning for local malignant recurrence. The patient is also more symptomatic with more dry cough and shortness of breath. I recommended for him to hold any additional treatment with chemotherapy for now. The patient was seen by Dr. Boyd at [facility] and treated with tarlatamab which is a by specific T-cell engager (BiTE) for the last 8 months.  Unfortunately he had evidence for disease progression after this treatment and it was discontinued. He started systemic chemotherapy with Topotecan  1.2 mg/M2 on days 1-5 every 3 weeks with Cosela  240 Mg/M2 IV before the chemotherapy.  First dose January 04, 2024.  Status post 6 cycle.  The patient has been tolerating this treatment fairly well except for the fatigue. He had repeat CT scan of the chest, abdomen and pelvis performed recently.  I personally independently reviewed the scan and discussed the result with the patient and his wife. His scan showed no concerning finding for disease progression and there was improvement in the liver metastasis after the stereotactic radiotherapy. Assessment and Plan    Extensive stage small cell lung cancer Extensive stage small cell lung cancer diagnosed in December 2021. Currently on reduced dose Topotecan , administered four to five days every three weeks. Recent CT scan of the chest,  abdomen, and pelvis shows overall good response to treatment with a reduction in the liver lesion size from 7.8 x 5.8 cm to 5.8 x 4.7 cm. Slight increase in size of a lymph node near the liver,  but no significant progression. Fatigue and shortness of breath likely related to anemia and treatment. White blood cell count is low but acceptable for treatment. - Delay next cycle of Topotecan  by one week to allow for recovery. - Resume treatment next week instead of this week. - Deaccess port and discharge home.  Anemia Anemia contributing to fatigue and shortness of breath. White blood cell count is low but improving, currently over 100,000. - Monitor blood counts closely. - Provide an extra week off treatment to allow for recovery of blood counts.   The patient was advised to call immediately if he has any other concerning symptoms in the interval. The patient voices understanding of current disease status and treatment options and is in agreement with the current care plan.  All questions were answered. The patient knows to call the clinic with any problems, questions or concerns. We can certainly see the patient much sooner if necessary.  The total time spent in the appointment was 30 minutes.  Disclaimer: This note was dictated with voice recognition software. Similar sounding words can inadvertently be transcribed and may not be corrected upon review. Sherrod MARLA Sherrod, MD 06/13/24

## 2024-06-14 ENCOUNTER — Ambulatory Visit: Payer: PRIVATE HEALTH INSURANCE

## 2024-06-15 ENCOUNTER — Ambulatory Visit: Payer: PRIVATE HEALTH INSURANCE

## 2024-06-16 ENCOUNTER — Ambulatory Visit: Payer: PRIVATE HEALTH INSURANCE

## 2024-06-20 ENCOUNTER — Inpatient Hospital Stay: Attending: Radiation Oncology

## 2024-06-20 ENCOUNTER — Ambulatory Visit: Payer: PRIVATE HEALTH INSURANCE

## 2024-06-20 ENCOUNTER — Encounter: Payer: Self-pay | Admitting: Internal Medicine

## 2024-06-20 ENCOUNTER — Inpatient Hospital Stay

## 2024-06-20 VITALS — BP 107/71 | HR 104 | Temp 98.1°F | Resp 18 | Ht 69.0 in | Wt 243.0 lb

## 2024-06-20 DIAGNOSIS — C7951 Secondary malignant neoplasm of bone: Secondary | ICD-10-CM | POA: Diagnosis not present

## 2024-06-20 DIAGNOSIS — Z515 Encounter for palliative care: Secondary | ICD-10-CM | POA: Insufficient documentation

## 2024-06-20 DIAGNOSIS — E785 Hyperlipidemia, unspecified: Secondary | ICD-10-CM | POA: Diagnosis not present

## 2024-06-20 DIAGNOSIS — Z5111 Encounter for antineoplastic chemotherapy: Secondary | ICD-10-CM | POA: Diagnosis not present

## 2024-06-20 DIAGNOSIS — C3411 Malignant neoplasm of upper lobe, right bronchus or lung: Secondary | ICD-10-CM | POA: Diagnosis not present

## 2024-06-20 DIAGNOSIS — Z95828 Presence of other vascular implants and grafts: Secondary | ICD-10-CM

## 2024-06-20 DIAGNOSIS — Z452 Encounter for adjustment and management of vascular access device: Secondary | ICD-10-CM | POA: Diagnosis not present

## 2024-06-20 DIAGNOSIS — G893 Neoplasm related pain (acute) (chronic): Secondary | ICD-10-CM | POA: Diagnosis not present

## 2024-06-20 DIAGNOSIS — I10 Essential (primary) hypertension: Secondary | ICD-10-CM | POA: Insufficient documentation

## 2024-06-20 DIAGNOSIS — E119 Type 2 diabetes mellitus without complications: Secondary | ICD-10-CM | POA: Diagnosis not present

## 2024-06-20 DIAGNOSIS — D649 Anemia, unspecified: Secondary | ICD-10-CM | POA: Insufficient documentation

## 2024-06-20 DIAGNOSIS — C787 Secondary malignant neoplasm of liver and intrahepatic bile duct: Secondary | ICD-10-CM | POA: Diagnosis not present

## 2024-06-20 LAB — CBC WITH DIFFERENTIAL (CANCER CENTER ONLY)
Abs Immature Granulocytes: 0.02 K/uL (ref 0.00–0.07)
Basophils Absolute: 0 K/uL (ref 0.0–0.1)
Basophils Relative: 0 %
Eosinophils Absolute: 0 K/uL (ref 0.0–0.5)
Eosinophils Relative: 0 %
HCT: 29.3 % — ABNORMAL LOW (ref 39.0–52.0)
Hemoglobin: 9.2 g/dL — ABNORMAL LOW (ref 13.0–17.0)
Immature Granulocytes: 0 %
Lymphocytes Relative: 7 %
Lymphs Abs: 0.3 K/uL — ABNORMAL LOW (ref 0.7–4.0)
MCH: 31.4 pg (ref 26.0–34.0)
MCHC: 31.4 g/dL (ref 30.0–36.0)
MCV: 100 fL (ref 80.0–100.0)
Monocytes Absolute: 0.5 K/uL (ref 0.1–1.0)
Monocytes Relative: 11 %
Neutro Abs: 3.6 K/uL (ref 1.7–7.7)
Neutrophils Relative %: 82 %
Platelet Count: 257 K/uL (ref 150–400)
RBC: 2.93 MIL/uL — ABNORMAL LOW (ref 4.22–5.81)
RDW: 15.9 % — ABNORMAL HIGH (ref 11.5–15.5)
WBC Count: 4.5 K/uL (ref 4.0–10.5)
nRBC: 0 % (ref 0.0–0.2)

## 2024-06-20 LAB — CMP (CANCER CENTER ONLY)
ALT: 18 U/L (ref 0–44)
AST: 25 U/L (ref 15–41)
Albumin: 3.3 g/dL — ABNORMAL LOW (ref 3.5–5.0)
Alkaline Phosphatase: 83 U/L (ref 38–126)
Anion gap: 7 (ref 5–15)
BUN: 11 mg/dL (ref 8–23)
CO2: 29 mmol/L (ref 22–32)
Calcium: 8.8 mg/dL — ABNORMAL LOW (ref 8.9–10.3)
Chloride: 103 mmol/L (ref 98–111)
Creatinine: 0.94 mg/dL (ref 0.61–1.24)
GFR, Estimated: >60 mL/min (ref >60)
Glucose, Bld: 153 mg/dL — ABNORMAL HIGH (ref 70–99)
Potassium: 4.4 mmol/L (ref 3.5–5.1)
Sodium: 139 mmol/L (ref 135–145)
Total Bilirubin: 0.3 mg/dL (ref 0.0–1.2)
Total Protein: 5.7 g/dL — ABNORMAL LOW (ref 6.5–8.1)

## 2024-06-20 LAB — SAMPLE TO BLOOD BANK

## 2024-06-20 MED ORDER — SODIUM CHLORIDE 0.9% FLUSH
10.0000 mL | INTRAVENOUS | Status: DC | PRN
Start: 2024-06-20 — End: 2024-06-20

## 2024-06-20 MED ORDER — SODIUM CHLORIDE 0.9% FLUSH
10.0000 mL | Freq: Once | INTRAVENOUS | Status: AC
  Administered 2024-06-20: 10 mL

## 2024-06-20 MED ORDER — TRILACICLIB DIHYDROCHLORIDE INJECTION 300 MG
240.0000 mg/m2 | Freq: Once | INTRAVENOUS | Status: AC
  Administered 2024-06-20: 600 mg via INTRAVENOUS
  Filled 2024-06-20: qty 40

## 2024-06-20 MED ORDER — HEPARIN SOD (PORK) LOCK FLUSH 100 UNIT/ML IV SOLN
500.0000 [IU] | Freq: Once | INTRAVENOUS | Status: DC | PRN
Start: 2024-06-20 — End: 2024-06-20

## 2024-06-20 MED ORDER — PROCHLORPERAZINE MALEATE 10 MG PO TABS
10.0000 mg | ORAL_TABLET | Freq: Once | ORAL | Status: AC
Start: 2024-06-20 — End: 2024-06-20
  Administered 2024-06-20: 10 mg via ORAL
  Filled 2024-06-20: qty 1

## 2024-06-20 MED ORDER — TOPOTECAN HCL CHEMO INJECTION 4 MG
1.0000 mg/m2 | Freq: Once | INTRAVENOUS | Status: AC
  Administered 2024-06-20: 2.4 mg via INTRAVENOUS
  Filled 2024-06-20: qty 2.4

## 2024-06-20 MED ORDER — SODIUM CHLORIDE 0.9 % IV SOLN: INTRAVENOUS | Status: DC

## 2024-06-20 NOTE — Patient Instructions (Signed)
 [facility] - A DEPT OF [facility]  Discharge Instructions: Thank you for choosing [facility] to provide your oncology and hematology care.   If you have a lab appointment with the Cancer Center, please go directly to the Cancer Center and check in at the registration area.   Wear comfortable clothing and clothing appropriate for easy access to any Portacath or PICC line.   We strive to give you quality time with your provider. You may need to reschedule your appointment if you arrive late (15 or more minutes).  Arriving late affects you and other patients whose appointments are after yours.  Also, if you miss three or more appointments without notifying the office, you may be dismissed from the clinic at the provider's discretion.      For prescription refill requests, have your pharmacy contact our office and allow 72 hours for refills to be completed.    Today you received the following chemotherapy and/or immunotherapy agents topotecan      To help prevent nausea and vomiting after your treatment, we encourage you to take your nausea medication as directed.  BELOW ARE SYMPTOMS THAT SHOULD BE REPORTED IMMEDIATELY: *FEVER GREATER THAN 100.4 F (38 C) OR HIGHER *CHILLS OR SWEATING *NAUSEA AND VOMITING THAT IS NOT CONTROLLED WITH YOUR NAUSEA MEDICATION *UNUSUAL SHORTNESS OF BREATH *UNUSUAL BRUISING OR BLEEDING *URINARY PROBLEMS (pain or burning when urinating, or frequent urination) *BOWEL PROBLEMS (unusual diarrhea, constipation, pain near the anus) TENDERNESS IN MOUTH AND THROAT WITH OR WITHOUT PRESENCE OF ULCERS (sore throat, sores in mouth, or a toothache) UNUSUAL RASH, SWELLING OR PAIN  UNUSUAL VAGINAL DISCHARGE OR ITCHING   Items with * indicate a potential emergency and should be followed up as soon as possible or go to the Emergency Department if any problems should occur.  Please show the CHEMOTHERAPY ALERT CARD or IMMUNOTHERAPY  ALERT CARD at check-in to the Emergency Department and triage nurse.  Should you have questions after your visit or need to cancel or reschedule your appointment, please contact [facility] - A DEPT OF [facility]  Dept: [phone]  and follow the prompts.  Office hours are 8:00 a.m. to 4:30 p.m. Monday - Friday. Please note that voicemails left after 4:00 p.m. may not be returned until the following business day.  We are closed weekends and major holidays. You have access to a nurse at all times for urgent questions. Please call the main number to the clinic Dept: [phone] and follow the prompts.   For any non-urgent questions, you may also contact your provider using MyChart. We now offer e-Visits for anyone 13 and older to request care online for non-urgent symptoms. For details visit [email or web address].   Also download the MyChart app! Go to the app store, search "MyChart", open the app, select [place], and log in with your MyChart username and password.

## 2024-06-20 NOTE — Progress Notes (Signed)
 Per Cassie Heilingoetter, PA - okay to treat with HR 104.

## 2024-06-21 ENCOUNTER — Inpatient Hospital Stay

## 2024-06-21 VITALS — BP 99/59 | HR 100 | Resp 18

## 2024-06-21 DIAGNOSIS — C787 Secondary malignant neoplasm of liver and intrahepatic bile duct: Secondary | ICD-10-CM | POA: Diagnosis not present

## 2024-06-21 DIAGNOSIS — C3411 Malignant neoplasm of upper lobe, right bronchus or lung: Secondary | ICD-10-CM

## 2024-06-21 DIAGNOSIS — C7951 Secondary malignant neoplasm of bone: Secondary | ICD-10-CM | POA: Diagnosis not present

## 2024-06-21 DIAGNOSIS — Z515 Encounter for palliative care: Secondary | ICD-10-CM | POA: Diagnosis not present

## 2024-06-21 DIAGNOSIS — Z5111 Encounter for antineoplastic chemotherapy: Secondary | ICD-10-CM | POA: Diagnosis not present

## 2024-06-21 DIAGNOSIS — G893 Neoplasm related pain (acute) (chronic): Secondary | ICD-10-CM | POA: Diagnosis not present

## 2024-06-21 MED ORDER — TOPOTECAN HCL CHEMO INJECTION 4 MG
1.0000 mg/m2 | Freq: Once | INTRAVENOUS | Status: AC
  Administered 2024-06-21: 2.4 mg via INTRAVENOUS
  Filled 2024-06-21: qty 2.4

## 2024-06-21 MED ORDER — SODIUM CHLORIDE 0.9 % IV SOLN: INTRAVENOUS | Status: DC

## 2024-06-21 MED ORDER — PROCHLORPERAZINE MALEATE 10 MG PO TABS
10.0000 mg | ORAL_TABLET | Freq: Once | ORAL | Status: AC
  Administered 2024-06-21: 10 mg via ORAL
  Filled 2024-06-21: qty 1

## 2024-06-21 MED ORDER — TRILACICLIB DIHYDROCHLORIDE INJECTION 300 MG
240.0000 mg/m2 | Freq: Once | INTRAVENOUS | Status: AC
  Administered 2024-06-21: 600 mg via INTRAVENOUS
  Filled 2024-06-21: qty 40

## 2024-06-21 NOTE — Patient Instructions (Signed)
 [facility] - A DEPT OF [facility]  Discharge Instructions: Thank you for choosing [facility] to provide your oncology and hematology care.   If you have a lab appointment with the Cancer Center, please go directly to the Cancer Center and check in at the registration area.   Wear comfortable clothing and clothing appropriate for easy access to any Portacath or PICC line.   We strive to give you quality time with your provider. You may need to reschedule your appointment if you arrive late (15 or more minutes).  Arriving late affects you and other patients whose appointments are after yours.  Also, if you miss three or more appointments without notifying the office, you may be dismissed from the clinic at the provider's discretion.      For prescription refill requests, have your pharmacy contact our office and allow 72 hours for refills to be completed.    Today you received the following chemotherapy and/or immunotherapy agents: topotecan /cosela      To help prevent nausea and vomiting after your treatment, we encourage you to take your nausea medication as directed.  BELOW ARE SYMPTOMS THAT SHOULD BE REPORTED IMMEDIATELY: *FEVER GREATER THAN 100.4 F (38 C) OR HIGHER *CHILLS OR SWEATING *NAUSEA AND VOMITING THAT IS NOT CONTROLLED WITH YOUR NAUSEA MEDICATION *UNUSUAL SHORTNESS OF BREATH *UNUSUAL BRUISING OR BLEEDING *URINARY PROBLEMS (pain or burning when urinating, or frequent urination) *BOWEL PROBLEMS (unusual diarrhea, constipation, pain near the anus) TENDERNESS IN MOUTH AND THROAT WITH OR WITHOUT PRESENCE OF ULCERS (sore throat, sores in mouth, or a toothache) UNUSUAL RASH, SWELLING OR PAIN  UNUSUAL VAGINAL DISCHARGE OR ITCHING   Items with * indicate a potential emergency and should be followed up as soon as possible or go to the Emergency Department if any problems should occur.  Please show the CHEMOTHERAPY ALERT CARD or  IMMUNOTHERAPY ALERT CARD at check-in to the Emergency Department and triage nurse.  Should you have questions after your visit or need to cancel or reschedule your appointment, please contact [facility] - A DEPT [facility]  Dept: [phone]  and follow the prompts.  Office hours are 8:00 a.m. to 4:30 p.m. Monday - Friday. Please note that voicemails left after 4:00 p.m. may not be returned until the following business day.  We are closed weekends and major holidays. You have access to a nurse at all times for urgent questions. Please call the main number to the clinic Dept: [phone] and follow the prompts.   For any non-urgent questions, you may also contact your provider using MyChart. We now offer e-Visits for anyone 78 and older to request care online for non-urgent symptoms. For details visit [email or web address].   Also download the MyChart app! Go to the app store, search MyChart, open the app, select [place], and log in with your MyChart username and password.

## 2024-06-22 ENCOUNTER — Inpatient Hospital Stay

## 2024-06-22 VITALS — BP 102/62 | HR 88 | Temp 98.0°F | Resp 18

## 2024-06-22 DIAGNOSIS — Z515 Encounter for palliative care: Secondary | ICD-10-CM | POA: Diagnosis not present

## 2024-06-22 DIAGNOSIS — G893 Neoplasm related pain (acute) (chronic): Secondary | ICD-10-CM | POA: Diagnosis not present

## 2024-06-22 DIAGNOSIS — C787 Secondary malignant neoplasm of liver and intrahepatic bile duct: Secondary | ICD-10-CM | POA: Diagnosis not present

## 2024-06-22 DIAGNOSIS — C3411 Malignant neoplasm of upper lobe, right bronchus or lung: Secondary | ICD-10-CM | POA: Diagnosis not present

## 2024-06-22 DIAGNOSIS — C7951 Secondary malignant neoplasm of bone: Secondary | ICD-10-CM | POA: Diagnosis not present

## 2024-06-22 DIAGNOSIS — Z5111 Encounter for antineoplastic chemotherapy: Secondary | ICD-10-CM | POA: Diagnosis not present

## 2024-06-22 MED ORDER — TOPOTECAN HCL CHEMO INJECTION 4 MG
1.0000 mg/m2 | Freq: Once | INTRAVENOUS | Status: AC
  Administered 2024-06-22: 2.4 mg via INTRAVENOUS
  Filled 2024-06-22: qty 2.4

## 2024-06-22 MED ORDER — SODIUM CHLORIDE 0.9 % IV SOLN
INTRAVENOUS | Status: DC
Start: 2024-06-22 — End: 2024-06-22

## 2024-06-22 MED ORDER — TRILACICLIB DIHYDROCHLORIDE INJECTION 300 MG
240.0000 mg/m2 | Freq: Once | INTRAVENOUS | Status: AC
  Administered 2024-06-22: 600 mg via INTRAVENOUS
  Filled 2024-06-22: qty 40

## 2024-06-22 MED ORDER — PROCHLORPERAZINE MALEATE 10 MG PO TABS
10.0000 mg | ORAL_TABLET | Freq: Once | ORAL | Status: AC
  Administered 2024-06-22: 10 mg via ORAL
  Filled 2024-06-22: qty 1

## 2024-06-22 NOTE — Patient Instructions (Signed)
 [facility] - A DEPT OF [place]  Discharge Instructions: Thank you for choosing [facility] to provide your oncology and hematology care.   If you have a lab appointment with the Cancer Center, please go directly to the Cancer Center and check in at the registration area.   Wear comfortable clothing and clothing appropriate for easy access to any Portacath or PICC line.   We strive to give you quality time with your provider. You may need to reschedule your appointment if you arrive late (15 or more minutes).  Arriving late affects you and other patients whose appointments are after yours.  Also, if you miss three or more appointments without notifying the office, you may be dismissed from the clinic at the provider's discretion.      For prescription refill requests, have your pharmacy contact our office and allow 72 hours for refills to be completed.    Today you received the following chemotherapy and/or immunotherapy agents: topotecan /cosela      To help prevent nausea and vomiting after your treatment, we encourage you to take your nausea medication as directed.  BELOW ARE SYMPTOMS THAT SHOULD BE REPORTED IMMEDIATELY: *FEVER GREATER THAN 100.4 F (38 C) OR HIGHER *CHILLS OR SWEATING *NAUSEA AND VOMITING THAT IS NOT CONTROLLED WITH YOUR NAUSEA MEDICATION *UNUSUAL SHORTNESS OF BREATH *UNUSUAL BRUISING OR BLEEDING *URINARY PROBLEMS (pain or burning when urinating, or frequent urination) *BOWEL PROBLEMS (unusual diarrhea, constipation, pain near the anus) TENDERNESS IN MOUTH AND THROAT WITH OR WITHOUT PRESENCE OF ULCERS (sore throat, sores in mouth, or a toothache) UNUSUAL RASH, SWELLING OR PAIN  UNUSUAL VAGINAL DISCHARGE OR ITCHING   Items with * indicate a potential emergency and should be followed up as soon as possible or go to the Emergency Department if any problems should occur.  Please show the CHEMOTHERAPY ALERT CARD or  IMMUNOTHERAPY ALERT CARD at check-in to the Emergency Department and triage nurse.  Should you have questions after your visit or need to cancel or reschedule your appointment, please contact [facility]  Dept: [phone]  and follow the prompts.  Office hours are 8:00 a.m. to 4:30 p.m. Monday - Friday. Please note that voicemails left after 4:00 p.m. may not be returned until the following business day.  We are closed weekends and major holidays. You have access to a nurse at all times for urgent questions. Please call the main number to the clinic Dept: [phone] and follow the prompts.   For any non-urgent questions, you may also contact your provider using MyChart. We now offer e-Visits for anyone 45 and older to request care online for non-urgent symptoms. For details visit [email or web address].   Also download the MyChart app! Go to the app store, search MyChart, open the app, select [place], and log in with your MyChart username and password.

## 2024-06-23 ENCOUNTER — Other Ambulatory Visit: Payer: Self-pay | Admitting: Physician Assistant

## 2024-06-23 ENCOUNTER — Inpatient Hospital Stay

## 2024-06-23 VITALS — BP 101/68 | HR 96 | Temp 98.0°F | Resp 17

## 2024-06-23 DIAGNOSIS — C7951 Secondary malignant neoplasm of bone: Secondary | ICD-10-CM | POA: Diagnosis not present

## 2024-06-23 DIAGNOSIS — Z515 Encounter for palliative care: Secondary | ICD-10-CM | POA: Diagnosis not present

## 2024-06-23 DIAGNOSIS — Z5111 Encounter for antineoplastic chemotherapy: Secondary | ICD-10-CM | POA: Diagnosis not present

## 2024-06-23 DIAGNOSIS — I959 Hypotension, unspecified: Secondary | ICD-10-CM

## 2024-06-23 DIAGNOSIS — C787 Secondary malignant neoplasm of liver and intrahepatic bile duct: Secondary | ICD-10-CM | POA: Diagnosis not present

## 2024-06-23 DIAGNOSIS — G893 Neoplasm related pain (acute) (chronic): Secondary | ICD-10-CM | POA: Diagnosis not present

## 2024-06-23 DIAGNOSIS — C3411 Malignant neoplasm of upper lobe, right bronchus or lung: Secondary | ICD-10-CM

## 2024-06-23 DIAGNOSIS — I440 Atrioventricular block, first degree: Secondary | ICD-10-CM

## 2024-06-23 MED ORDER — TRILACICLIB DIHYDROCHLORIDE INJECTION 300 MG
240.0000 mg/m2 | Freq: Once | INTRAVENOUS | Status: AC
  Administered 2024-06-23: 600 mg via INTRAVENOUS
  Filled 2024-06-23: qty 40

## 2024-06-23 MED ORDER — TOPOTECAN HCL CHEMO INJECTION 4 MG
1.0000 mg/m2 | Freq: Once | INTRAVENOUS | Status: AC
  Administered 2024-06-23: 2.4 mg via INTRAVENOUS
  Filled 2024-06-23: qty 2.4

## 2024-06-23 MED ORDER — PROCHLORPERAZINE MALEATE 10 MG PO TABS
10.0000 mg | ORAL_TABLET | Freq: Once | ORAL | Status: AC
  Administered 2024-06-23: 10 mg via ORAL
  Filled 2024-06-23: qty 1

## 2024-06-23 MED ORDER — SODIUM CHLORIDE 0.9% FLUSH
10.0000 mL | INTRAVENOUS | Status: DC | PRN
  Administered 2024-06-23: 10 mL

## 2024-06-23 MED ORDER — HEPARIN SOD (PORK) LOCK FLUSH 100 UNIT/ML IV SOLN
500.0000 [IU] | Freq: Once | INTRAVENOUS | Status: AC | PRN
  Administered 2024-06-23: 500 [IU]

## 2024-06-23 MED ORDER — SODIUM CHLORIDE 0.9 % IV SOLN: Freq: Once | INTRAVENOUS | Status: AC

## 2024-06-23 MED ORDER — SODIUM CHLORIDE 0.9 % IV SOLN
INTRAVENOUS | Status: DC
Start: 2024-06-23 — End: 2024-06-23

## 2024-06-23 NOTE — Progress Notes (Signed)
 Pt to infusion today with bp 85/62 and pulse 119. Endorses fatigue with no other complaints. Denies chest pain, dizziness, N/V/D. States he has been eating and drinking well. Notified Cassie H., PA. IVF given and EKG completed. Per Cassie, ok to proceed with treatment once bp sys >90. Bp improved after 250 cc NS, fluids continued throughout treatment. Educated pt on continuing to monitor bp at home and prior to taking his bp medication. Cassie, PA placed referral to cardiology, pt aware. VS improved prior to discharge, see flowsheet for documentation.

## 2024-06-23 NOTE — Patient Instructions (Addendum)
 [facility] - A DEPT OF [place]  Discharge Instructions: Thank you for choosing [facility] to provide your oncology and hematology care.   If you have a lab appointment with the Cancer Center, please go directly to the Cancer Center and check in at the registration area.   Wear comfortable clothing and clothing appropriate for easy access to any Portacath or PICC line.   We strive to give you quality time with your provider. You may need to reschedule your appointment if you arrive late (15 or more minutes).  Arriving late affects you and other patients whose appointments are after yours.  Also, if you miss three or more appointments without notifying the office, you may be dismissed from the clinic at the provider's discretion.      For prescription refill requests, have your pharmacy contact our office and allow 72 hours for refills to be completed.    Today you received the following chemotherapy and/or immunotherapy agents: Topotecan        To help prevent nausea and vomiting after your treatment, we encourage you to take your nausea medication as directed.  BELOW ARE SYMPTOMS THAT SHOULD BE REPORTED IMMEDIATELY: *FEVER GREATER THAN 100.4 F (38 C) OR HIGHER *CHILLS OR SWEATING *NAUSEA AND VOMITING THAT IS NOT CONTROLLED WITH YOUR NAUSEA MEDICATION *UNUSUAL SHORTNESS OF BREATH *UNUSUAL BRUISING OR BLEEDING *URINARY PROBLEMS (pain or burning when urinating, or frequent urination) *BOWEL PROBLEMS (unusual diarrhea, constipation, pain near the anus) TENDERNESS IN MOUTH AND THROAT WITH OR WITHOUT PRESENCE OF ULCERS (sore throat, sores in mouth, or a toothache) UNUSUAL RASH, SWELLING OR PAIN  UNUSUAL VAGINAL DISCHARGE OR ITCHING   Items with * indicate a potential emergency and should be followed up as soon as possible or go to the Emergency Department if any problems should occur.  Please show the CHEMOTHERAPY ALERT CARD or IMMUNOTHERAPY  ALERT CARD at check-in to the Emergency Department and triage nurse.  Should you have questions after your visit or need to cancel or reschedule your appointment, please contact [facility]  Dept: [phone]  and follow the prompts.  Office hours are 8:00 a.m. to 4:30 p.m. Monday - Friday. Please note that voicemails left after 4:00 p.m. may not be returned until the following business day.  We are closed weekends and major holidays. You have access to a nurse at all times for urgent questions. Please call the main number to the clinic Dept: [phone] and follow the prompts.   For any non-urgent questions, you may also contact your provider using MyChart. We now offer e-Visits for anyone 74 and older to request care online for non-urgent symptoms. For details visit [email or web address].   Also download the MyChart app! Go to the app store, search MyChart, open the app, select , and log in with your MyChart username and password.  Rehydration, Older Adult  Rehydration is the replacement of fluids, salts, and minerals in the body (electrolytes) that are lost during dehydration. Dehydration is when there is not enough water or other fluids in the body. This happens when you lose more fluids than you take in. People who are age 74 or older have a higher risk of dehydration than younger adults. This is because in older age, the body: Is less able to maintain the right amount of water. Does not respond to temperature changes as well. Does not  get a sense of thirst as easily or quickly. Other causes include: Not drinking enough fluids. This can occur when you are ill, when you forget to drink, or when you are doing activities that require a lot of energy, especially in hot weather. Conditions that cause loss of water or other fluids. These include diarrhea, vomiting, sweating, or urinating a lot. Other illnesses, such as fever  or infection. Certain medicines, such as those that remove excess fluid from the body (diuretics). Symptoms of mild or moderate dehydration may include thirst, dry lips and mouth, and dizziness. Symptoms of severe dehydration may include increased heart rate, confusion, fainting, and not urinating. In severe cases, you may need to get fluids through an IV at the hospital. For mild or moderate cases, you can usually rehydrate at home by drinking certain fluids as told by your health care provider. What are the risks? Rehydration is usually safe. Taking in too much fluid (overhydration) can be a problem but is rare. Overhydration can cause an imbalance of electrolytes in the body, kidney failure, fluid in the lungs, or a decrease in salt (sodium) levels in the body. Supplies needed: You will need an oral rehydration solution (ORS) if your health care provider tells you to use one. This is a drink to treat dehydration. It can be found in pharmacies and retail stores. How to rehydrate Fluids Follow instructions from your health care provider about what to drink. The kind of fluid and the amount you should drink depend on your condition. In general, you should choose drinks that you prefer. If told by your health care provider, drink an ORS. Make an ORS by following instructions on the package. Start by drinking small amounts, about  cup (120 mL) every 5-10 minutes. Slowly increase how much you drink until you have taken in the amount recommended by your health care provider. Drink enough clear fluids to keep your urine pale yellow. If you were told to drink an ORS, finish it first, then start slowly drinking other clear fluids. Drink fluids such as: Water. This includes sparkling and flavored water. Drinking only water can lead to having too little sodium in your body (hyponatremia). Follow the advice of your health care provider. Water from ice chips you suck on. Fruit juice with water added to  it(diluted). Sports drinks. Hot or cold herbal teas. Broth-based soups. Coffee. Milk or milk products. Food Follow instructions from your health care provider about what to eat while you rehydrate. Your health care provider may recommend that you slowly begin eating regular foods in small amounts. Eat foods that contain a healthy balance of electrolytes, such as bananas, oranges, potatoes, tomatoes, and spinach. Avoid foods that are greasy or contain a lot of sugar. In some cases, you may get nutrition through a feeding tube that is passed through your nose and into your stomach (nasogastric tube, or NG tube). This may be done if you have uncontrolled vomiting or diarrhea. Drinks to avoid  Certain drinks may make dehydration worse. While you rehydrate, avoid drinking alcohol. How to tell if you are recovering from dehydration You may be getting better if: You are urinating more often than before you started rehydrating. Your urine is pale yellow. Your energy level improves. You vomit less often. You have diarrhea less often. Your appetite improves or returns to normal. You feel less dizzy or light-headed. Your skin tone and color start to look more normal. Follow these instructions at home: Take over-the-counter and prescription medicines only  as told by your health care provider. Do not take sodium tablets. Doing this can lead to having too much sodium in your body (hypernatremia). Contact a health care provider if: You continue to have symptoms of mild or moderate dehydration, such as: Thirst. Dry lips. Slightly dry mouth. Dizziness. Dark urine or less urine than usual. Muscle cramps. You continue to vomit or have diarrhea. Get help right away if: You have symptoms of dehydration that get worse. You have a fever. You have a severe headache. You have been vomiting and have problems, such as: Your vomiting gets worse. Your vomit includes blood or green matter (bile). You  cannot eat or drink without vomiting. You have problems with urination or bowel movements, such as: Diarrhea that gets worse. Blood in your stool (feces). This may cause stool to look black and tarry. Not urinating, or urinating only a small amount of very dark urine, within 6-8 hours. You have trouble breathing. You have symptoms that get worse with treatment. These symptoms may be an emergency. Get help right away. Call 911. Do not wait to see if the symptoms will go away. Do not drive yourself to the hospital. This information is not intended to replace advice given to you by your health care provider. Make sure you discuss any questions you have with your health care provider. Document Revised: 04/16/2022 Document Reviewed: 04/14/2022 Elsevier Patient Education  2024 [employer].

## 2024-06-24 ENCOUNTER — Inpatient Hospital Stay

## 2024-06-24 VITALS — BP 99/81 | HR 98 | Temp 98.4°F

## 2024-06-24 DIAGNOSIS — G893 Neoplasm related pain (acute) (chronic): Secondary | ICD-10-CM | POA: Diagnosis not present

## 2024-06-24 DIAGNOSIS — Z515 Encounter for palliative care: Secondary | ICD-10-CM | POA: Diagnosis not present

## 2024-06-24 DIAGNOSIS — C3411 Malignant neoplasm of upper lobe, right bronchus or lung: Secondary | ICD-10-CM | POA: Diagnosis not present

## 2024-06-24 DIAGNOSIS — C7951 Secondary malignant neoplasm of bone: Secondary | ICD-10-CM | POA: Diagnosis not present

## 2024-06-24 DIAGNOSIS — C787 Secondary malignant neoplasm of liver and intrahepatic bile duct: Secondary | ICD-10-CM | POA: Diagnosis not present

## 2024-06-24 DIAGNOSIS — Z5111 Encounter for antineoplastic chemotherapy: Secondary | ICD-10-CM | POA: Diagnosis not present

## 2024-06-24 MED ORDER — HEPARIN SOD (PORK) LOCK FLUSH 100 UNIT/ML IV SOLN
500.0000 [IU] | Freq: Once | INTRAVENOUS | Status: AC | PRN
  Administered 2024-06-24: 500 [IU]

## 2024-06-24 MED ORDER — TOPOTECAN HCL CHEMO INJECTION 4 MG/4ML
1.0000 mg/m2 | Freq: Once | INTRAVENOUS | Status: AC
  Administered 2024-06-24: 2.4 mg via INTRAVENOUS
  Filled 2024-06-24: qty 2.4

## 2024-06-24 MED ORDER — SODIUM CHLORIDE 0.9% FLUSH
10.0000 mL | INTRAVENOUS | Status: DC | PRN
  Administered 2024-06-24: 10 mL

## 2024-06-24 MED ORDER — TRILACICLIB DIHYDROCHLORIDE INJECTION 300 MG
240.0000 mg/m2 | Freq: Once | INTRAVENOUS | Status: AC
  Administered 2024-06-24: 600 mg via INTRAVENOUS
  Filled 2024-06-24: qty 40

## 2024-06-24 MED ORDER — PROCHLORPERAZINE MALEATE 10 MG PO TABS
10.0000 mg | ORAL_TABLET | Freq: Once | ORAL | Status: AC
Start: 2024-06-24 — End: 2024-06-24
  Administered 2024-06-24: 10 mg via ORAL
  Filled 2024-06-24: qty 1

## 2024-06-24 MED ORDER — SODIUM CHLORIDE 0.9 % IV SOLN: INTRAVENOUS | Status: DC

## 2024-06-24 NOTE — Patient Instructions (Signed)
 [facility] - A DEPT OF [place]  Discharge Instructions: Thank you for choosing [facility] to provide your oncology and hematology care.   If you have a lab appointment with the Cancer Center, please go directly to the Cancer Center and check in at the registration area.   Wear comfortable clothing and clothing appropriate for easy access to any Portacath or PICC line.   We strive to give you quality time with your provider. You may need to reschedule your appointment if you arrive late (15 or more minutes).  Arriving late affects you and other patients whose appointments are after yours.  Also, if you miss three or more appointments without notifying the office, you may be dismissed from the clinic at the provider's discretion.      For prescription refill requests, have your pharmacy contact our office and allow 72 hours for refills to be completed.    Today you received the following chemotherapy and/or immunotherapy agents: Topotecan        To help prevent nausea and vomiting after your treatment, we encourage you to take your nausea medication as directed.  BELOW ARE SYMPTOMS THAT SHOULD BE REPORTED IMMEDIATELY: *FEVER GREATER THAN 100.4 F (38 C) OR HIGHER *CHILLS OR SWEATING *NAUSEA AND VOMITING THAT IS NOT CONTROLLED WITH YOUR NAUSEA MEDICATION *UNUSUAL SHORTNESS OF BREATH *UNUSUAL BRUISING OR BLEEDING *URINARY PROBLEMS (pain or burning when urinating, or frequent urination) *BOWEL PROBLEMS (unusual diarrhea, constipation, pain near the anus) TENDERNESS IN MOUTH AND THROAT WITH OR WITHOUT PRESENCE OF ULCERS (sore throat, sores in mouth, or a toothache) UNUSUAL RASH, SWELLING OR PAIN  UNUSUAL VAGINAL DISCHARGE OR ITCHING   Items with * indicate a potential emergency and should be followed up as soon as possible or go to the Emergency Department if any problems should occur.  Please show the CHEMOTHERAPY ALERT CARD or IMMUNOTHERAPY  ALERT CARD at check-in to the Emergency Department and triage nurse.  Should you have questions after your visit or need to cancel or reschedule your appointment, please contact [facility]  Dept: [phone]  and follow the prompts.  Office hours are 8:00 a.m. to 4:30 p.m. Monday - Friday. Please note that voicemails left after 4:00 p.m. may not be returned until the following business day.  We are closed weekends and major holidays. You have access to a nurse at all times for urgent questions. Please call the main number to the clinic Dept: [phone] and follow the prompts.   For any non-urgent questions, you may also contact your provider using MyChart. We now offer e-Visits for anyone 34 and older to request care online for non-urgent symptoms. For details visit [email or web address].   Also download the MyChart app! Go to the app store, search MyChart, open the app, select [place], and log in with your MyChart username and password.  Rehydration, Older Adult  Rehydration is the replacement of fluids, salts, and minerals in the body (electrolytes) that are lost during dehydration. Dehydration is when there is not enough water or other fluids in the body. This happens when you lose more fluids than you take in. People who are age 49 or older have a higher risk of dehydration than younger adults. This is because in older age, the body: Is less able to maintain the right amount of water. Does not respond to temperature changes as well. Does not  get a sense of thirst as easily or quickly. Other causes include: Not drinking enough fluids. This can occur when you are ill, when you forget to drink, or when you are doing activities that require a lot of energy, especially in hot weather. Conditions that cause loss of water or other fluids. These include diarrhea, vomiting, sweating, or urinating a lot. Other illnesses, such as fever  or infection. Certain medicines, such as those that remove excess fluid from the body (diuretics). Symptoms of mild or moderate dehydration may include thirst, dry lips and mouth, and dizziness. Symptoms of severe dehydration may include increased heart rate, confusion, fainting, and not urinating. In severe cases, you may need to get fluids through an IV at the hospital. For mild or moderate cases, you can usually rehydrate at home by drinking certain fluids as told by your health care provider. What are the risks? Rehydration is usually safe. Taking in too much fluid (overhydration) can be a problem but is rare. Overhydration can cause an imbalance of electrolytes in the body, kidney failure, fluid in the lungs, or a decrease in salt (sodium) levels in the body. Supplies needed: You will need an oral rehydration solution (ORS) if your health care provider tells you to use one. This is a drink to treat dehydration. It can be found in pharmacies and retail stores. How to rehydrate Fluids Follow instructions from your health care provider about what to drink. The kind of fluid and the amount you should drink depend on your condition. In general, you should choose drinks that you prefer. If told by your health care provider, drink an ORS. Make an ORS by following instructions on the package. Start by drinking small amounts, about  cup (120 mL) every 5-10 minutes. Slowly increase how much you drink until you have taken in the amount recommended by your health care provider. Drink enough clear fluids to keep your urine pale yellow. If you were told to drink an ORS, finish it first, then start slowly drinking other clear fluids. Drink fluids such as: Water. This includes sparkling and flavored water. Drinking only water can lead to having too little sodium in your body (hyponatremia). Follow the advice of your health care provider. Water from ice chips you suck on. Fruit juice with water added to  it(diluted). Sports drinks. Hot or cold herbal teas. Broth-based soups. Coffee. Milk or milk products. Food Follow instructions from your health care provider about what to eat while you rehydrate. Your health care provider may recommend that you slowly begin eating regular foods in small amounts. Eat foods that contain a healthy balance of electrolytes, such as bananas, oranges, potatoes, tomatoes, and spinach. Avoid foods that are greasy or contain a lot of sugar. In some cases, you may get nutrition through a feeding tube that is passed through your nose and into your stomach (nasogastric tube, or NG tube). This may be done if you have uncontrolled vomiting or diarrhea. Drinks to avoid  Certain drinks may make dehydration worse. While you rehydrate, avoid drinking alcohol. How to tell if you are recovering from dehydration You may be getting better if: You are urinating more often than before you started rehydrating. Your urine is pale yellow. Your energy level improves. You vomit less often. You have diarrhea less often. Your appetite improves or returns to normal. You feel less dizzy or light-headed. Your skin tone and color start to look more normal. Follow these instructions at home: Take over-the-counter and prescription medicines only  as told by your health care provider. Do not take sodium tablets. Doing this can lead to having too much sodium in your body (hypernatremia). Contact a health care provider if: You continue to have symptoms of mild or moderate dehydration, such as: Thirst. Dry lips. Slightly dry mouth. Dizziness. Dark urine or less urine than usual. Muscle cramps. You continue to vomit or have diarrhea. Get help right away if: You have symptoms of dehydration that get worse. You have a fever. You have a severe headache. You have been vomiting and have problems, such as: Your vomiting gets worse. Your vomit includes blood or green matter (bile). You  cannot eat or drink without vomiting. You have problems with urination or bowel movements, such as: Diarrhea that gets worse. Blood in your stool (feces). This may cause stool to look black and tarry. Not urinating, or urinating only a small amount of very dark urine, within 6-8 hours. You have trouble breathing. You have symptoms that get worse with treatment. These symptoms may be an emergency. Get help right away. Call 911. Do not wait to see if the symptoms will go away. Do not drive yourself to the hospital. This information is not intended to replace advice given to you by your health care provider. Make sure you discuss any questions you have with your health care provider. Document Revised: 04/16/2022 Document Reviewed: 04/14/2022 Elsevier Patient Education  2024 [employer].

## 2024-06-27 ENCOUNTER — Encounter: Payer: Self-pay | Admitting: Internal Medicine

## 2024-06-27 ENCOUNTER — Inpatient Hospital Stay

## 2024-06-27 DIAGNOSIS — Z5111 Encounter for antineoplastic chemotherapy: Secondary | ICD-10-CM | POA: Diagnosis not present

## 2024-06-27 DIAGNOSIS — C3411 Malignant neoplasm of upper lobe, right bronchus or lung: Secondary | ICD-10-CM | POA: Diagnosis not present

## 2024-06-27 DIAGNOSIS — Z515 Encounter for palliative care: Secondary | ICD-10-CM | POA: Diagnosis not present

## 2024-06-27 DIAGNOSIS — C787 Secondary malignant neoplasm of liver and intrahepatic bile duct: Secondary | ICD-10-CM | POA: Diagnosis not present

## 2024-06-27 DIAGNOSIS — G893 Neoplasm related pain (acute) (chronic): Secondary | ICD-10-CM | POA: Diagnosis not present

## 2024-06-27 DIAGNOSIS — C7951 Secondary malignant neoplasm of bone: Secondary | ICD-10-CM | POA: Diagnosis not present

## 2024-06-27 LAB — CBC WITH DIFFERENTIAL (CANCER CENTER ONLY)
Abs Immature Granulocytes: 0.01 K/uL (ref 0.00–0.07)
Basophils Absolute: 0 K/uL (ref 0.0–0.1)
Basophils Relative: 1 %
Eosinophils Absolute: 0 K/uL (ref 0.0–0.5)
Eosinophils Relative: 1 %
HCT: 26 % — ABNORMAL LOW (ref 39.0–52.0)
Hemoglobin: 8.4 g/dL — ABNORMAL LOW (ref 13.0–17.0)
Immature Granulocytes: 0 %
Lymphocytes Relative: 8 %
Lymphs Abs: 0.2 K/uL — ABNORMAL LOW (ref 0.7–4.0)
MCH: 31.7 pg (ref 26.0–34.0)
MCHC: 32.3 g/dL (ref 30.0–36.0)
MCV: 98.1 fL (ref 80.0–100.0)
Monocytes Absolute: 0.1 K/uL (ref 0.1–1.0)
Monocytes Relative: 2 %
Neutro Abs: 2.5 K/uL (ref 1.7–7.7)
Neutrophils Relative %: 88 %
Platelet Count: 93 K/uL — ABNORMAL LOW (ref 150–400)
RBC: 2.65 MIL/uL — ABNORMAL LOW (ref 4.22–5.81)
RDW: 15.1 % (ref 11.5–15.5)
Smear Review: NORMAL
WBC Count: 2.9 K/uL — ABNORMAL LOW (ref 4.0–10.5)
nRBC: 0 % (ref 0.0–0.2)

## 2024-06-27 LAB — CMP (CANCER CENTER ONLY)
ALT: 15 U/L (ref 0–44)
AST: 24 U/L (ref 15–41)
Albumin: 3.4 g/dL — ABNORMAL LOW (ref 3.5–5.0)
Alkaline Phosphatase: 86 U/L (ref 38–126)
Anion gap: 8 (ref 5–15)
BUN: 14 mg/dL (ref 8–23)
CO2: 28 mmol/L (ref 22–32)
Calcium: 8.9 mg/dL (ref 8.9–10.3)
Chloride: 104 mmol/L (ref 98–111)
Creatinine: 1.06 mg/dL (ref 0.61–1.24)
GFR, Estimated: >60 mL/min (ref >60)
Glucose, Bld: 146 mg/dL — ABNORMAL HIGH (ref 70–99)
Potassium: 4.3 mmol/L (ref 3.5–5.1)
Sodium: 140 mmol/L (ref 135–145)
Total Bilirubin: 0.5 mg/dL (ref 0.0–1.2)
Total Protein: 5.9 g/dL — ABNORMAL LOW (ref 6.5–8.1)

## 2024-06-27 LAB — SAMPLE TO BLOOD BANK

## 2024-06-28 ENCOUNTER — Encounter: Payer: Self-pay | Admitting: Internal Medicine

## 2024-06-28 ENCOUNTER — Other Ambulatory Visit: Payer: Self-pay | Admitting: Physician Assistant

## 2024-06-30 ENCOUNTER — Other Ambulatory Visit: Payer: Self-pay | Admitting: Primary Care

## 2024-06-30 DIAGNOSIS — J9611 Chronic respiratory failure with hypoxia: Secondary | ICD-10-CM

## 2024-07-04 ENCOUNTER — Other Ambulatory Visit: Payer: PRIVATE HEALTH INSURANCE

## 2024-07-04 ENCOUNTER — Ambulatory Visit: Payer: PRIVATE HEALTH INSURANCE | Admitting: Physician Assistant

## 2024-07-04 ENCOUNTER — Inpatient Hospital Stay

## 2024-07-04 ENCOUNTER — Ambulatory Visit: Payer: PRIVATE HEALTH INSURANCE

## 2024-07-04 DIAGNOSIS — C787 Secondary malignant neoplasm of liver and intrahepatic bile duct: Secondary | ICD-10-CM | POA: Diagnosis not present

## 2024-07-04 DIAGNOSIS — C3411 Malignant neoplasm of upper lobe, right bronchus or lung: Secondary | ICD-10-CM

## 2024-07-04 DIAGNOSIS — G893 Neoplasm related pain (acute) (chronic): Secondary | ICD-10-CM | POA: Diagnosis not present

## 2024-07-04 DIAGNOSIS — Z95828 Presence of other vascular implants and grafts: Secondary | ICD-10-CM

## 2024-07-04 DIAGNOSIS — C7951 Secondary malignant neoplasm of bone: Secondary | ICD-10-CM | POA: Diagnosis not present

## 2024-07-04 DIAGNOSIS — Z5111 Encounter for antineoplastic chemotherapy: Secondary | ICD-10-CM | POA: Diagnosis not present

## 2024-07-04 DIAGNOSIS — Z515 Encounter for palliative care: Secondary | ICD-10-CM | POA: Diagnosis not present

## 2024-07-04 LAB — CMP (CANCER CENTER ONLY)
ALT: 18 U/L (ref 0–44)
AST: 26 U/L (ref 15–41)
Albumin: 3.3 g/dL — ABNORMAL LOW (ref 3.5–5.0)
Alkaline Phosphatase: 94 U/L (ref 38–126)
Anion gap: 8 (ref 5–15)
BUN: 11 mg/dL (ref 8–23)
CO2: 29 mmol/L (ref 22–32)
Calcium: 8.4 mg/dL — ABNORMAL LOW (ref 8.9–10.3)
Chloride: 103 mmol/L (ref 98–111)
Creatinine: 0.9 mg/dL (ref 0.61–1.24)
GFR, Estimated: >60 mL/min (ref >60)
Glucose, Bld: 135 mg/dL — ABNORMAL HIGH (ref 70–99)
Potassium: 3.9 mmol/L (ref 3.5–5.1)
Sodium: 140 mmol/L (ref 135–145)
Total Bilirubin: 0.4 mg/dL (ref 0.0–1.2)
Total Protein: 5.9 g/dL — ABNORMAL LOW (ref 6.5–8.1)

## 2024-07-04 LAB — CBC WITH DIFFERENTIAL (CANCER CENTER ONLY)
Abs Immature Granulocytes: 0.01 K/uL (ref 0.00–0.07)
Basophils Absolute: 0 K/uL (ref 0.0–0.1)
Basophils Relative: 1 %
Eosinophils Absolute: 0 K/uL (ref 0.0–0.5)
Eosinophils Relative: 1 %
HCT: 23.4 % — ABNORMAL LOW (ref 39.0–52.0)
Hemoglobin: 7.5 g/dL — ABNORMAL LOW (ref 13.0–17.0)
Immature Granulocytes: 1 %
Lymphocytes Relative: 16 %
Lymphs Abs: 0.3 K/uL — ABNORMAL LOW (ref 0.7–4.0)
MCH: 31 pg (ref 26.0–34.0)
MCHC: 32.1 g/dL (ref 30.0–36.0)
MCV: 96.7 fL (ref 80.0–100.0)
Monocytes Absolute: 0.3 K/uL (ref 0.1–1.0)
Monocytes Relative: 18 %
Neutro Abs: 1.1 K/uL — ABNORMAL LOW (ref 1.7–7.7)
Neutrophils Relative %: 63 %
Platelet Count: 19 K/uL — ABNORMAL LOW (ref 150–400)
RBC: 2.42 MIL/uL — ABNORMAL LOW (ref 4.22–5.81)
RDW: 15.5 % (ref 11.5–15.5)
WBC Count: 1.7 K/uL — ABNORMAL LOW (ref 4.0–10.5)
nRBC: 1.2 % — ABNORMAL HIGH (ref 0.0–0.2)

## 2024-07-04 MED ORDER — HEPARIN SOD (PORK) LOCK FLUSH 100 UNIT/ML IV SOLN
500.0000 [IU] | Freq: Once | INTRAVENOUS | Status: AC
Start: 2024-07-04 — End: 2024-07-04
  Administered 2024-07-04: 500 [IU]

## 2024-07-04 MED ORDER — SODIUM CHLORIDE 0.9% FLUSH
10.0000 mL | Freq: Once | INTRAVENOUS | Status: AC
Start: 2024-07-04 — End: 2024-07-04
  Administered 2024-07-04: 10 mL

## 2024-07-05 ENCOUNTER — Ambulatory Visit: Payer: PRIVATE HEALTH INSURANCE

## 2024-07-05 ENCOUNTER — Other Ambulatory Visit: Payer: Self-pay

## 2024-07-05 DIAGNOSIS — D696 Thrombocytopenia, unspecified: Secondary | ICD-10-CM

## 2024-07-05 DIAGNOSIS — D649 Anemia, unspecified: Secondary | ICD-10-CM

## 2024-07-06 ENCOUNTER — Other Ambulatory Visit: Payer: Self-pay | Admitting: Physician Assistant

## 2024-07-06 ENCOUNTER — Ambulatory Visit: Payer: PRIVATE HEALTH INSURANCE

## 2024-07-06 ENCOUNTER — Inpatient Hospital Stay

## 2024-07-06 ENCOUNTER — Other Ambulatory Visit: Payer: Self-pay

## 2024-07-06 ENCOUNTER — Encounter: Payer: Self-pay | Admitting: Internal Medicine

## 2024-07-06 DIAGNOSIS — D649 Anemia, unspecified: Secondary | ICD-10-CM

## 2024-07-06 DIAGNOSIS — D696 Thrombocytopenia, unspecified: Secondary | ICD-10-CM

## 2024-07-06 DIAGNOSIS — Z95828 Presence of other vascular implants and grafts: Secondary | ICD-10-CM

## 2024-07-06 DIAGNOSIS — C3411 Malignant neoplasm of upper lobe, right bronchus or lung: Secondary | ICD-10-CM | POA: Diagnosis not present

## 2024-07-06 DIAGNOSIS — G893 Neoplasm related pain (acute) (chronic): Secondary | ICD-10-CM | POA: Diagnosis not present

## 2024-07-06 DIAGNOSIS — Z5111 Encounter for antineoplastic chemotherapy: Secondary | ICD-10-CM | POA: Diagnosis not present

## 2024-07-06 DIAGNOSIS — C7951 Secondary malignant neoplasm of bone: Secondary | ICD-10-CM | POA: Diagnosis not present

## 2024-07-06 DIAGNOSIS — C787 Secondary malignant neoplasm of liver and intrahepatic bile duct: Secondary | ICD-10-CM | POA: Diagnosis not present

## 2024-07-06 DIAGNOSIS — Z515 Encounter for palliative care: Secondary | ICD-10-CM | POA: Diagnosis not present

## 2024-07-06 LAB — CBC WITH DIFFERENTIAL (CANCER CENTER ONLY)
Abs Immature Granulocytes: 0.01 K/uL (ref 0.00–0.07)
Basophils Absolute: 0 K/uL (ref 0.0–0.1)
Basophils Relative: 1 %
Eosinophils Absolute: 0 K/uL (ref 0.0–0.5)
Eosinophils Relative: 1 %
HCT: 22.3 % — ABNORMAL LOW (ref 39.0–52.0)
Hemoglobin: 7.2 g/dL — ABNORMAL LOW (ref 13.0–17.0)
Immature Granulocytes: 1 %
Lymphocytes Relative: 13 %
Lymphs Abs: 0.2 K/uL — ABNORMAL LOW (ref 0.7–4.0)
MCH: 32.3 pg (ref 26.0–34.0)
MCHC: 32.3 g/dL (ref 30.0–36.0)
MCV: 100 fL (ref 80.0–100.0)
Monocytes Absolute: 0.4 K/uL (ref 0.1–1.0)
Monocytes Relative: 19 %
Neutro Abs: 1.3 K/uL — ABNORMAL LOW (ref 1.7–7.7)
Neutrophils Relative %: 65 %
Platelet Count: 49 K/uL — ABNORMAL LOW (ref 150–400)
RBC: 2.23 MIL/uL — ABNORMAL LOW (ref 4.22–5.81)
RDW: 17.2 % — ABNORMAL HIGH (ref 11.5–15.5)
WBC Count: 1.9 K/uL — ABNORMAL LOW (ref 4.0–10.5)
nRBC: 0 % (ref 0.0–0.2)

## 2024-07-06 LAB — SAMPLE TO BLOOD BANK

## 2024-07-06 LAB — PREPARE RBC (CROSSMATCH)

## 2024-07-06 LAB — CMP (CANCER CENTER ONLY)
ALT: 17 U/L (ref 0–44)
AST: 21 U/L (ref 15–41)
Albumin: 3.2 g/dL — ABNORMAL LOW (ref 3.5–5.0)
Alkaline Phosphatase: 94 U/L (ref 38–126)
Anion gap: 7 (ref 5–15)
BUN: 16 mg/dL (ref 8–23)
CO2: 28 mmol/L (ref 22–32)
Calcium: 8 mg/dL — ABNORMAL LOW (ref 8.9–10.3)
Chloride: 105 mmol/L (ref 98–111)
Creatinine: 0.98 mg/dL (ref 0.61–1.24)
GFR, Estimated: >60 mL/min (ref >60)
Glucose, Bld: 163 mg/dL — ABNORMAL HIGH (ref 70–99)
Potassium: 4.3 mmol/L (ref 3.5–5.1)
Sodium: 140 mmol/L (ref 135–145)
Total Bilirubin: 0.4 mg/dL (ref 0.0–1.2)
Total Protein: 5.6 g/dL — ABNORMAL LOW (ref 6.5–8.1)

## 2024-07-06 MED ORDER — SODIUM CHLORIDE 0.9% FLUSH
10.0000 mL | Freq: Once | INTRAVENOUS | Status: AC
  Administered 2024-07-06: 10 mL

## 2024-07-06 MED ORDER — SODIUM CHLORIDE 0.9% IV SOLUTION
250.0000 mL | Freq: Once | INTRAVENOUS | Status: AC
  Administered 2024-07-06: 100 mL via INTRAVENOUS

## 2024-07-06 MED ORDER — DIPHENHYDRAMINE HCL 25 MG PO CAPS
25.0000 mg | ORAL_CAPSULE | Freq: Once | ORAL | Status: AC
  Administered 2024-07-06: 25 mg via ORAL
  Filled 2024-07-06: qty 1

## 2024-07-06 MED ORDER — ACETAMINOPHEN 325 MG PO TABS
650.0000 mg | ORAL_TABLET | Freq: Once | ORAL | Status: AC
  Administered 2024-07-06: 650 mg via ORAL
  Filled 2024-07-06: qty 2

## 2024-07-06 NOTE — Progress Notes (Signed)
 Spoke with Blood bank and canceled prepare for platelets and confirmed 1 unit of blood for today.

## 2024-07-06 NOTE — Patient Instructions (Signed)

## 2024-07-07 ENCOUNTER — Ambulatory Visit: Payer: PRIVATE HEALTH INSURANCE

## 2024-07-07 LAB — TYPE AND SCREEN
ABO/RH(D): B POS
Antibody Screen: NEGATIVE
Unit division: 0

## 2024-07-07 LAB — BPAM RBC
Blood Product Expiration Date: 202508202359
ISSUE DATE / TIME: 202507231440
Unit Type and Rh: 7300

## 2024-07-08 ENCOUNTER — Encounter: Payer: Self-pay | Admitting: Internal Medicine

## 2024-07-08 ENCOUNTER — Ambulatory Visit: Payer: PRIVATE HEALTH INSURANCE

## 2024-07-09 NOTE — Progress Notes (Unsigned)
 [facility] OFFICE PROGRESS NOTE  Adam Wise POUR, NP [address] Adam Wise Adam Wise  DIAGNOSIS: Relapsed extensive stage (T4, N3, M1c)  small cell lung cancer diagnosed in April 2021 and presented with large right upper lobe/right hilar mass with ipsilateral and contralateral mediastinal and right supraclavicular lymphadenopathy in addition to multiple liver lesions. He has disease progression after the first line of chemotherapy in December 2021.   PRIOR THERAPY:  1) Palliative radiotherapy to the right upper lobe lung mass under the care of Dr. Dewey. 2) Systemic chemotherapy with carboplatin  for AUC of 5 on day 1, etoposide  100 mg/M2 on days 1, 2 and 3 in addition to Imfinzi  1500 mg IV every 3 weeks with chemotherapy then every 4 weeks for maintenance if the patient has no evidence for progression.  He will also receive Cosela  240 mg/m2 on the days of the chemotherapy.  Status post 9 cycles.  Starting from cycle #5 the patient will be on maintenance treatment with immunotherapy with Imfinzi  1500 mg IV every 4 weeks. Last dose of chemotherapy was given on November 13, 2020. This treatment was discontinued secondary to disease progression 3) Systemic chemotherapy with carboplatin  for AUC of 5 on day 1, etoposide  100 mg/M2 on days 1, 2 and 3, Tecentriq  1200 mg IV every 3 weeks as well as Cosela  250 mg/M2 on the days of the chemotherapy every 3 weeks.  First dose December 18, 2020.  Status post 8 cycles. 4) Zepzelca  (lurbinectedin ) 3.2 mgm2 IV every 3 weeks. Last dose on 01/23/22. Status post 12 cycles.  5) Palliative systemic chemotherapy with irinotecan  65 mg/m2 on days 1 and 8 IV every 3 weeks.  Status post 3 cycles.  Last dose was given April 01, 2022 discontinued secondary to disease progression. 6) SBRT to the progressive liver lesions under the care of Dr. Dewey.  Last fraction January 27, 2023. 7) systemic chemotherapy with carboplatin  for AUC of 5 on day 1, etoposide  100  Mg/M2 on days 1, 2 and 3 with Cosela  before the chemotherapy.  First dose expected to start on 05/28/2022.  Status post 15 cycles.  Starting from cycle #5 his carboplatin  will be reduced to AUC of 4 and 2 etoposide  80 Mg/M2.  Last dose was giving April 07, 2023 discontinued for suspicious disease progression. 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 Dewey. Last dose on 05/16/24.   CURRENT THERAPY: 1) Topotecan  1.2 mg/M2 on days 1-5 every 3 weeks with Cosela  240 Mg/M2 IV before the chemotherapy.  First dose January 11, 2024. Status post 7 cycles.   INTERVAL HISTORY: Adam Wise 73 y.o. male returns to the clinic today for a follow-up visit.  The was undergoing systemic chemotherapy with topotecan .   In the interval since last being seen, he had cytoepnias. He required a blood transfusion. His platelet count improved prior to requiring a platelet transfusion.   He denied abnormal bleeding/bruising. His energy had ***.   He ***has seen a back specialist at [facility] for ***.   The patient has stable fatigue. He denies fevers. He lost a few pounds since being seen. He denies changes in his dyspnea on exertion. He denies cough, chest pain, or hemoptysis. Denies any nausea, vomiting, or diarrhea. He has some constipation for which he takes mirlax. He denies any rashes or skin changes.  He denies any headache or visual changes.  He is here today for evaluation repeat blood work before undergoing cycle #8       Got blood last week.   Bp low few weeks ago for IVF.   MEDICAL HISTORY: Past Medical History:  Diagnosis Date   Acute on chronic respiratory failure with hypoxia (HCC) 10/12/2021   Chickenpox    Chronic knee pain    Chronic low back pain    COPD exacerbation (HCC) 10/11/2021   COPD with exacerbation (HCC) 10/12/2021   Coronary artery calcification seen on CAT scan 11/2021   Coronary CTA  11/27/2021: Moderate to large right pleural effusion and compressive atelectasis right lung base. => Coronary Calcium Score 657.  Diffuse RCA calcification.  Minimal mild disease in the LAD and diagonal branches. == Overall limited study.  Notable artifact.   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 He has disease progression after the first line of chemotherapy in December 2021.   Testosterone  deficiency    Type 2 diabetes mellitus (HCC)     ALLERGIES:  is allergic to bupropion and hydrochlorothiazide.  MEDICATIONS:  Current Outpatient Medications  Medication Sig Dispense Refill   albuterol  (VENTOLIN  HFA) 108 (90 Base) MCG/ACT inhaler Inhale 2 puffs into the lungs every 4 (four) hours as needed for shortness of breath. 18 each 0   amLODipine  (NORVASC ) 10 MG tablet Take 0.5 tablets (5 mg total) by mouth daily. TAKE 1 TABLET BY MOUTH EVERY DAY for blood pressure.     B-D 3CC LUER-LOK SYR 22GX1 22G X 1 3 ML MISC USE AS INSTRUCTED FOR TESTOSTERONE  INJECTION EVERY 2 WEEKS 10 each 2   Black Pepper-Turmeric (TURMERIC COMPLEX/BLACK PEPPER PO) Take 1 tablet by mouth in the morning and at bedtime.     Coenzyme Q10 (COQ10) 200 MG CAPS Take 200 mg by mouth at bedtime.     cyclobenzaprine  (FLEXERIL ) 5 MG tablet TAKE 1 TABLET BY MOUTH AT BEDTIME AS NEEDED FOR MUSCLE SPASMS. 90 tablet 0   docusate sodium  (COLACE) 100 MG capsule Take 1 capsule (100 mg total) by mouth every 12 (twelve) hours. 60 capsule 0   ELIQUIS  5 MG TABS tablet TAKE 1 TABLET BY MOUTH TWICE A DAY 180 tablet 2   esomeprazole (NEXIUM)  20 MG capsule Take 20 mg by mouth in the morning.     gabapentin  (NEURONTIN ) 300 MG capsule Take 1 capsule (300 mg total) by mouth 2 (two) times daily. For back pain. 180 capsule 1   glucose blood (ONETOUCH ULTRA) test strip USE UP TO 4 TIMES DAILY AS DIRECTED 400 strip 5   Insulin  Pen Needle (BD PEN NEEDLE NANO U/F) 32G X 4 MM MISC Use with insulin  as prescribed Dx Code: E11.9 100 each 3   ipratropium-albuterol  (DUONEB) 0.5-2.5 (3) MG/3ML SOLN Take 3 mLs by nebulization every 4 (four) hours while awake for 3 days, THEN 3 mLs every 4 (four) hours as needed (shortness of breath or wheezing). 360 mL 0   KRILL OIL PO Take 1 capsule by mouth daily.     Lancets MISC USE UP TO 3 TIMES DAILY AS DIRECTED 100 each 2  lidocaine  (LIDODERM ) 5 % Place 1 patch onto the skin daily. Remove & Discard patch within 12 hours or as directed by MD 30 patch 0   losartan  (COZAAR ) 50 MG tablet TAKE 1 TABLET (50 MG TOTAL) BY MOUTH DAILY. FOR BLOOD PRESSURE. 90 tablet 3   Melatonin 10 MG CAPS Take 10 mg by mouth at bedtime as needed (for sleep).     metFORMIN  (GLUCOPHAGE ) 1000 MG tablet TAKE 1 TABLET (1,000 MG TOTAL) BY MOUTH 2 (TWO) TIMES DAILY WITH A MEAL. FOR DIABETES 180 tablet 1   mirtazapine  (REMERON ) 15 MG tablet TAKE 1 TABLET BY MOUTH EVERYDAY AT BEDTIME 90 tablet 1   morphine  (MS CONTIN ) 30 MG 12 hr tablet Take 1 tablet (30 mg total) by mouth every 8 (eight) hours. 90 tablet 0   Multiple Vitamin (MULTI-VITAMINS) TABS Take 1 tablet by mouth daily with breakfast.     Naloxone  HCl 3 MG/0.1ML LIQD Place 1 spray into both nostrils once. For known/suspected opiod overdose. Every 2-3 minutes in alternating nostril till EMS arrives.     nystatin  (MYCOSTATIN ) 100000 UNIT/ML suspension Take 5 mLs (500,000 Units total) by mouth 4 (four) times daily. 60 mL 0   ondansetron  (ZOFRAN ) 8 MG tablet Take 0.5 tablets (4 mg total) by mouth every 8 (eight) hours as needed for nausea or vomiting. 45 tablet 3   oxyCODONE -acetaminophen   (PERCOCET) 10-325 MG tablet Take 1 tablet by mouth every 4 (four) hours as needed. 60 tablet 0   pioglitazone  (ACTOS ) 45 MG tablet TAKE 1 TABLET (45 MG TOTAL) BY MOUTH DAILY. FOR DIABETES. 90 tablet 1   polyethylene glycol powder (GLYCOLAX /MIRALAX ) 17 GM/SCOOP powder Take by mouth once.     pravastatin  (PRAVACHOL ) 40 MG tablet TAKE 1 TABLET BY MOUTH EVERY DAY IN THE EVENING FOR CHOLESTEROL 90 tablet 3   PRESCRIPTION MEDICATION CPAP- At bedtime     testosterone  cypionate (DEPOTESTOSTERONE CYPIONATE) 200 MG/ML injection Inject 200 mg into the muscle every 14 (fourteen) days.     No current facility-administered medications for this visit.   Facility-Administered Medications Ordered in Other Visits  Medication Dose Route Frequency Provider Last Rate Last Admin   heparin  lock flush 100 unit/mL  500 Units Intracatheter Once Sherrod Sherrod, MD       sodium chloride  flush (NS) 0.9 % injection 10 mL  10 mL Intracatheter Once Sherrod Sherrod, MD        SURGICAL HISTORY:  Past Surgical History:  Procedure Laterality Date   CHEST TUBE INSERTION Right 01/20/2022   Procedure: CHEST TUBE INSERTION;  Surgeon: Claudene Toribio BROCKS, MD;  Location: [facility] ENDOSCOPY;  Service: Pulmonary;  Laterality: Right;  w/ Talc  Pleurodesis, planned admit for Obs afterwards   CHEST TUBE INSERTION Right 04/28/2022   Procedure: INSERTION PLEURAL DRAINAGE CATHETER - Pigtail tail, drainage;  Surgeon: Claudene Toribio BROCKS, MD;  Location: [facility] ENDOSCOPY;  Service: Pulmonary;  Laterality: Right;  talc  pleurodesis   CHEST TUBE INSERTION Right 09/01/2022   Procedure: INSERTION PLEURAL DRAINAGE CATHETER;  Surgeon: Alaine Vicenta NOVAK, MD;  Location: [facility] ENDOSCOPY;  Service: Cardiopulmonary;  Laterality: Right;  pigtail catheter placement with talc  pleurodesis   COLONOSCOPY WITH PROPOFOL  N/A 12/17/2018   Procedure: COLONOSCOPY WITH PROPOFOL ;  Surgeon: Janalyn Keene NOVAK, MD;  Location: [facility] ENDOSCOPY;  Service: Gastroenterology;  Laterality: N/A;    IR IMAGING GUIDED PORT INSERTION  04/17/2020   IR THORACENTESIS ASP PLEURAL SPACE W/IMG GUIDE  11/29/2021   IR THORACENTESIS ASP PLEURAL SPACE W/IMG GUIDE  01/03/2022   JOINT  REPLACEMENT Bilateral    REPLACEMENT TOTAL KNEE BILATERAL  2015   TALC  PLEURODESIS  09/01/2022   Procedure: TALC  PLEURADESIS;  Surgeon: Alaine Vicenta NOVAK, MD;  Location: [facility] ENDOSCOPY;  Service: Cardiopulmonary;;   TONSILLECTOMY  1960   TRANSTHORACIC ECHOCARDIOGRAM  05/2020   a) 05/2020: EF 55 to 60%.  No R WMA.  Mild LVH.  Indeterminate LVEDP.  Unable to assess RVP.  Normal aortic and mitral valves.  Mildly elevated RAP.; b) 09/2021: EF 50-55%. No RWMA. Mild LVH. ~ LVEDP. Mild LA Dilation. NORMAL RV/RAP.  Normal MV/AoV.    REVIEW OF SYSTEMS:   Review of Systems  Constitutional: Negative for appetite change, chills, fatigue, fever and unexpected weight change.  HENT:   Negative for mouth sores, nosebleeds, sore throat and trouble swallowing.   Eyes: Negative for eye problems and icterus.  Respiratory: Negative for cough, hemoptysis, shortness of breath and wheezing.   Cardiovascular: Negative for chest pain and leg swelling.  Gastrointestinal: Negative for abdominal pain, constipation, diarrhea, nausea and vomiting.  Genitourinary: Negative for bladder incontinence, difficulty urinating, dysuria, frequency and hematuria.   Musculoskeletal: Negative for back pain, gait problem, neck pain and neck stiffness.  Skin: Negative for itching and rash.  Neurological: Negative for dizziness, extremity weakness, gait problem, headaches, light-headedness and seizures.  Hematological: Negative for adenopathy. Does not bruise/bleed easily.  Psychiatric/Behavioral: Negative for confusion, depression and sleep disturbance. The patient is not nervous/anxious.     PHYSICAL EXAMINATION:  There were no vitals taken for this visit.  ECOG PERFORMANCE STATUS: {CHL ONC ECOG [id number]  Physical Exam  Constitutional: Oriented to  person, place, and time and well-developed, well-nourished, and in no distress. No distress.  HENT:  Head: Normocephalic and atraumatic.  Mouth/Throat: Oropharynx is clear and moist. No oropharyngeal exudate.  Eyes: Conjunctivae are normal. Right eye exhibits no discharge. Left eye exhibits no discharge. No scleral icterus.  Neck: Normal range of motion. Neck supple.  Cardiovascular: Normal rate, regular rhythm, normal heart sounds and intact distal pulses.   Pulmonary/Chest: Effort normal and breath sounds normal. No respiratory distress. No wheezes. No rales.  Abdominal: Soft. Bowel sounds are normal. Exhibits no distension and no mass. There is no tenderness.  Musculoskeletal: Normal range of motion. Exhibits no edema.  Lymphadenopathy:    No cervical adenopathy.  Neurological: Alert and oriented to person, place, and time. Exhibits normal muscle tone. Gait normal. Coordination normal.  Skin: Skin is warm and dry. No rash noted. Not diaphoretic. No erythema. No pallor.  Psychiatric: Mood, memory and judgment normal.  Vitals reviewed.  LABORATORY DATA: Lab Results  Component Value Date   WBC 1.9 (L) 07/06/2024   HGB 7.2 (L) 07/06/2024   HCT 22.3 (L) 07/06/2024   MCV 100.0 07/06/2024   PLT 49 (L) 07/06/2024      Chemistry      Component Value Date/Time   NA 140 07/06/2024 1238   K 4.3 07/06/2024 1238   CL 105 07/06/2024 1238   CO2 28 07/06/2024 1238   BUN 16 07/06/2024 1238   CREATININE 0.98 07/06/2024 1238   CREATININE 1.45 (H) 10/18/2021 1533      Component Value Date/Time   CALCIUM 8.0 (L) 07/06/2024 1238   ALKPHOS 94 07/06/2024 1238   AST 21 07/06/2024 1238   ALT 17 07/06/2024 1238   BILITOT 0.4 07/06/2024 1238       RADIOGRAPHIC STUDIES:  CT CHEST ABDOMEN PELVIS W CONTRAST Result Date: 06/10/2024 CLINICAL DATA:  Small cell lung cancer.  Assess  treatment response. EXAM: CT CHEST, ABDOMEN, AND PELVIS WITH CONTRAST TECHNIQUE: Multidetector CT imaging of the  chest, abdomen and pelvis was performed following the standard protocol during bolus administration of intravenous contrast. RADIATION DOSE REDUCTION: This exam was performed according to the departmental dose-optimization program which includes automated exposure control, adjustment of the mA and/or kV according to patient size and/or use of iterative reconstruction technique. CONTRAST:  OMNIPAQUE  IOHEXOL  300 MG/ML  SOLN COMPARISON:  CT 03/08/2024. FINDINGS: CT CHEST FINDINGS Cardiovascular: Port in the anterior chest wall with tip in distal SVC. No significant vascular findings. Normal heart size. No pericardial effusion. Mediastinum/Nodes: No axillary or supraclavicular adenopathy. No mediastinal or hilar adenopathy. No pericardial fluid. Esophagus normal. Small lymph node along the descending thoracic aorta measuring 6 mm unchanged Lungs/Pleura: Perihilar consolidation in the RIGHT upper lobe with air bronchograms not changed from prior. Volume loss in RIGHT hemithorax. Sub solid nodule in the medial RIGHT lower lobe measuring 13 mm (111/series 6 is unchanged from 13 mm on comparison exam. No nodules in the LEFT lung. Musculoskeletal: No aggressive osseous lesion. CT ABDOMEN AND PELVIS FINDINGS Hepatobiliary: Geographic hypodense lesion in the superior RIGHT hepatic lobe measures 5.8 x 4.7 cm compared to 7.8 x 5.8 cm. Visually lesion appears slightly contracted. No new lesions identified in the liver parenchyma. Several metallic fiducial markers are present in the inferior RIGHT hepatic lobe. Low-density lesion adjacent the gallbladder fossa in the same region measuring 14 mm (57/2) compares to 16 mm. Gallbladder normal. Several enlarged lymph nodes in the porta hepatis. For example 21 mm node on image 59/2 compares to 16 mm. Lymph node inferior to the main portal vein measures 26 mm (image 62/2) compared to 23 mm. Pancreas: Pancreas is normal. No ductal dilatation. No pancreatic inflammation. Spleen:  Normal spleen Adrenals/urinary tract: Adrenal glands normal. Large nonenhancing RIGHT renal cysts are unchanged. Ureters and bladder normal. Stomach/Bowel: Stomach, small bowel, appendix, and cecum are normal. The colon and rectosigmoid colon are normal. Vascular/Lymphatic: Abdominal aorta is normal caliber with atherosclerotic calcification. There is no retroperitoneal or periportal lymphadenopathy. No pelvic lymphadenopathy. Reproductive: Prostate normal Other: No free fluid. Musculoskeletal: No aggressive osseous lesion. IMPRESSION: CHEST: 1. Stable post treatment changes in the RIGHT upper lobe. 2. Stable sub solid nodule in the RIGHT lower lobe. 3. No evidence of thoracic metastasis. PELVIS: 1. Slight decrease in size of hepatic metastasis in the superior RIGHT hepatic lobe. 2. Porta hepatis lymphadenopathy measures slightly larger. 3. No new sites of metastatic disease in the abdomen pelvis. 4.  Aortic Atherosclerosis (ICD10-I70.0). Electronically Signed   By: Jackquline Boxer M.D.   On: 06/10/2024 16:01     ASSESSMENT/PLAN:  This is a very pleasant 73 year old Caucasian male diagnosed with extensive stage (T4, N3, M1C) small cell lung cancer presented with large right upper lobe lung mass in addition to mediastinal and right supraclavicular lymphadenopathy and multiple metastatic liver lesions diagnosed in April 2021.    The patient initially underwent systemic chemotherapy with carboplatin  for an AUC of 5 on day 1, etoposide  100 mg per metered squared on days 1, 2, and 3 in addition to Cosela  for myeloprotection.  He also received immunotherapy with Imfinzi  on day 1 of every chemotherapy cycle.  He is status post 8 cycles.  Starting from cycle #5 he was on single agent immunotherapy with Imfinzi  IV every 4 weeks   The patient had evidence of disease progression on his scan from December 2021.   He was then started  on systemic chemotherapy with carboplatin  for an AUC of 5 on day 1, etoposide  100 mg  per metered squared on days 1, 2, and 3 in addition to Tecentriq  1200 mg on day 1, the patient is status post 8 cycles.  Starting from cycle #5, the patient started maintenance immunotherapy with Tecentriq .  This was discontinued due to evidence of disease progression.  The patient then underwent Zepzelca  IV every 3 weeks.  He is status post his 12 cycles and he tolerated it well except for fatigue a few days after treatment.  This was discontinued in February 2023 due to evidence of disease progression.    The patient then underwent treatment with irinotecan  65 mg per metered squared on days 1 and 8 IV every 3 weeks.  The patient is status post 3 cycles and tolerated it fairly well without any concerning adverse side effects.  This was discontinued secondary to disease progression.   Dr. Sherrod saw the patient after this to discuss the options including palliative care and hospice versus single agent Gemzar, or Taxol.  He also discussed repeating his initial treatment with carboplatin  and etoposide  since he had a good response to treatment in the past.   He was referred to Dr. Boyd at [facility]  for consideration of enrollment in the clinical trial with tarlatamab.    He also saw Dr. Lysbeth at [facility] for consideration of enrollment in the [place] [id number] trial.  The patient was not interested due to the travel burden.   The patient was interested in restarting systemic chemotherapy with carboplatin  for an AUC of 5 on day 1 and etoposide  100 mg per metered squared on days 1, 2, and 3 with Cosela . He is status post 14 cycles and tolerated it well. His dose of carboplatin  was reduced to an AUC of 4 and etoposide  80 mg per metered squared.     He underwent SBRT to a liver lesion  which was completed on 01/27/23.   The patient was seen by Dr. Boyd at [facility] and treated with tarlatamab which is a by specific T-cell engager (BiTE) for 8 months. Unfortunately the repeat CT scan of the chest,  abdomen and pelvis in December 2024 showed interval mild to moderate increase in the size of the segment 7 liver lesion in addition to interval mild increase in the size of portacaval and common hepatic artery lymph nodes with stable size retroperitoneal lymph nodes and stable size right adrenal nodule. There was no evidence for disease progression in the chest. There was focal enhancement in the right iliac bone concerning for metastatic disease.   He completed palliative radiation to the right iliac bone lesion in February 2025.   Dr. Sherrod recommended Topotecan  regimen: 5 days in a row every 3 weeks.  He tolerates it fairly well except he did have some cytopenias.  He is status post 7 cycles.  He had some progressive disease in the upper abdominal lymph nodes/liver.    He underwent SBRT and completed this on 05/16/2024.    The patient was seen with Dr. Sherrod today. Labs were reviewed. Recommend he ***  ***doses for cytopenias?  Standing orders for blood bank hold.   He is here to resume his systemic treatment today.  The patient was seen with Dr. Sherrod.  Recommend that He proceed with cycle #8 today as scheduled.   Will see him in 3 weeks for evaluation repeat blood work before undergoing cycle #9.    BP  We will arrange for a restaging CT CAP prior to his next cycle of treatment.***two or 3?     Constipation Constipation managed with Miralax .   Chronic back pain -Managed with morphine  and oxycodone .   The patient was advised to call immediately if he has any concerning symptoms in the interval. The patient voices understanding of current disease status and treatment options and is in agreement with the current care plan. All questions were answered. The patient knows to call the clinic with any problems, questions or concerns. We can certainly see the patient much sooner if necessary         No orders of the defined types were placed in this encounter.    I  spent {CHL ONC TIME VISIT - DTPQU:[id number]} counseling the patient face to face. The total time spent in the appointment was {CHL ONC TIME VISIT - DTPQU:[id number]}.  Adam Entwistle L Josef Tourigny, PA-C 07/09/24

## 2024-07-11 ENCOUNTER — Inpatient Hospital Stay: Payer: PRIVATE HEALTH INSURANCE

## 2024-07-11 ENCOUNTER — Inpatient Hospital Stay (HOSPITAL_BASED_OUTPATIENT_CLINIC_OR_DEPARTMENT_OTHER): Payer: PRIVATE HEALTH INSURANCE | Admitting: Physician Assistant

## 2024-07-11 ENCOUNTER — Encounter: Payer: Self-pay | Admitting: Radiation Oncology

## 2024-07-11 ENCOUNTER — Encounter: Payer: Self-pay | Admitting: Internal Medicine

## 2024-07-11 VITALS — HR 107

## 2024-07-11 VITALS — BP 113/75 | HR 107 | Temp 98.3°F | Resp 16 | Wt 243.6 lb

## 2024-07-11 DIAGNOSIS — C3411 Malignant neoplasm of upper lobe, right bronchus or lung: Secondary | ICD-10-CM

## 2024-07-11 DIAGNOSIS — C7951 Secondary malignant neoplasm of bone: Secondary | ICD-10-CM | POA: Diagnosis not present

## 2024-07-11 DIAGNOSIS — Z95828 Presence of other vascular implants and grafts: Secondary | ICD-10-CM

## 2024-07-11 DIAGNOSIS — Z5111 Encounter for antineoplastic chemotherapy: Secondary | ICD-10-CM | POA: Diagnosis not present

## 2024-07-11 DIAGNOSIS — Z515 Encounter for palliative care: Secondary | ICD-10-CM

## 2024-07-11 DIAGNOSIS — D649 Anemia, unspecified: Secondary | ICD-10-CM

## 2024-07-11 DIAGNOSIS — C787 Secondary malignant neoplasm of liver and intrahepatic bile duct: Secondary | ICD-10-CM | POA: Diagnosis not present

## 2024-07-11 DIAGNOSIS — D696 Thrombocytopenia, unspecified: Secondary | ICD-10-CM

## 2024-07-11 DIAGNOSIS — G893 Neoplasm related pain (acute) (chronic): Secondary | ICD-10-CM | POA: Diagnosis not present

## 2024-07-11 LAB — CMP (CANCER CENTER ONLY)
ALT: 15 U/L (ref 0–44)
AST: 20 U/L (ref 15–41)
Albumin: 3.2 g/dL — ABNORMAL LOW (ref 3.5–5.0)
Alkaline Phosphatase: 106 U/L (ref 38–126)
Anion gap: 7 (ref 5–15)
BUN: 14 mg/dL (ref 8–23)
CO2: 28 mmol/L (ref 22–32)
Calcium: 8.1 mg/dL — ABNORMAL LOW (ref 8.9–10.3)
Chloride: 103 mmol/L (ref 98–111)
Creatinine: 0.93 mg/dL (ref 0.61–1.24)
GFR, Estimated: >60 mL/min (ref >60)
Glucose, Bld: 194 mg/dL — ABNORMAL HIGH (ref 70–99)
Potassium: 4 mmol/L (ref 3.5–5.1)
Sodium: 138 mmol/L (ref 135–145)
Total Bilirubin: 0.5 mg/dL (ref 0.0–1.2)
Total Protein: 5.8 g/dL — ABNORMAL LOW (ref 6.5–8.1)

## 2024-07-11 LAB — CBC WITH DIFFERENTIAL (CANCER CENTER ONLY)
Abs Immature Granulocytes: 0.02 K/uL (ref 0.00–0.07)
Basophils Absolute: 0 K/uL (ref 0.0–0.1)
Basophils Relative: 0 %
Eosinophils Absolute: 0 K/uL (ref 0.0–0.5)
Eosinophils Relative: 1 %
HCT: 26.7 % — ABNORMAL LOW (ref 39.0–52.0)
Hemoglobin: 8.6 g/dL — ABNORMAL LOW (ref 13.0–17.0)
Immature Granulocytes: 1 %
Lymphocytes Relative: 6 %
Lymphs Abs: 0.2 K/uL — ABNORMAL LOW (ref 0.7–4.0)
MCH: 31.2 pg (ref 26.0–34.0)
MCHC: 32.2 g/dL (ref 30.0–36.0)
MCV: 96.7 fL (ref 80.0–100.0)
Monocytes Absolute: 0.5 K/uL (ref 0.1–1.0)
Monocytes Relative: 15 %
Neutro Abs: 2.7 K/uL (ref 1.7–7.7)
Neutrophils Relative %: 77 %
Platelet Count: 169 K/uL (ref 150–400)
RBC: 2.76 MIL/uL — ABNORMAL LOW (ref 4.22–5.81)
RDW: 17.7 % — ABNORMAL HIGH (ref 11.5–15.5)
WBC Count: 3.4 K/uL — ABNORMAL LOW (ref 4.0–10.5)
nRBC: 0 % (ref 0.0–0.2)

## 2024-07-11 LAB — SAMPLE TO BLOOD BANK

## 2024-07-11 MED ORDER — SODIUM CHLORIDE 0.9 % IV SOLN
INTRAVENOUS | Status: DC
Start: 2024-07-11 — End: 2024-07-11

## 2024-07-11 MED ORDER — TOPOTECAN HCL CHEMO INJECTION 4 MG
1.0000 mg/m2 | Freq: Once | INTRAVENOUS | Status: AC
  Administered 2024-07-11: 2.4 mg via INTRAVENOUS
  Filled 2024-07-11: qty 2.4

## 2024-07-11 MED ORDER — TRILACICLIB DIHYDROCHLORIDE INJECTION 300 MG
240.0000 mg/m2 | Freq: Once | INTRAVENOUS | Status: AC
  Administered 2024-07-11: 600 mg via INTRAVENOUS
  Filled 2024-07-11: qty 40

## 2024-07-11 MED ORDER — SODIUM CHLORIDE 0.9% FLUSH
10.0000 mL | INTRAVENOUS | Status: DC | PRN
  Administered 2024-07-11: 10 mL

## 2024-07-11 MED ORDER — HEPARIN SOD (PORK) LOCK FLUSH 100 UNIT/ML IV SOLN
500.0000 [IU] | Freq: Once | INTRAVENOUS | Status: AC | PRN
  Administered 2024-07-11: 500 [IU]

## 2024-07-11 MED ORDER — SODIUM CHLORIDE 0.9% FLUSH
10.0000 mL | Freq: Once | INTRAVENOUS | Status: AC
  Administered 2024-07-11: 10 mL

## 2024-07-11 MED ORDER — ONDANSETRON HCL 8 MG PO TABS: 8.0000 mg | ORAL_TABLET | Freq: Three times a day (TID) | ORAL | 3 refills | Status: DC | PRN

## 2024-07-11 MED ORDER — PROCHLORPERAZINE MALEATE 10 MG PO TABS
10.0000 mg | ORAL_TABLET | Freq: Once | ORAL | Status: AC
  Administered 2024-07-11: 10 mg via ORAL
  Filled 2024-07-11: qty 1

## 2024-07-11 NOTE — Progress Notes (Signed)
 Per Cassie Heilingoetter, PA, OK to treat today with pulse 114.

## 2024-07-11 NOTE — Patient Instructions (Signed)
 [facility] - A DEPT OF [facility]  Discharge Instructions: Thank you for choosing [facility] to provide your oncology and hematology care.   If you have a lab appointment with the Cancer Center, please go directly to the Cancer Center and check in at the registration area.   Wear comfortable clothing and clothing appropriate for easy access to any Portacath or PICC line.   We strive to give you quality time with your provider. You may need to reschedule your appointment if you arrive late (15 or more minutes).  Arriving late affects you and other patients whose appointments are after yours.  Also, if you miss three or more appointments without notifying the office, you may be dismissed from the clinic at the provider's discretion.      For prescription refill requests, have your pharmacy contact our office and allow 72 hours for refills to be completed.    Today you received the following chemotherapy and/or immunotherapy agents: Cosela . Hycamtin       To help prevent nausea and vomiting after your treatment, we encourage you to take your nausea medication as directed.  BELOW ARE SYMPTOMS THAT SHOULD BE REPORTED IMMEDIATELY: *FEVER GREATER THAN 100.4 F (38 C) OR HIGHER *CHILLS OR SWEATING *NAUSEA AND VOMITING THAT IS NOT CONTROLLED WITH YOUR NAUSEA MEDICATION *UNUSUAL SHORTNESS OF BREATH *UNUSUAL BRUISING OR BLEEDING *URINARY PROBLEMS (pain or burning when urinating, or frequent urination) *BOWEL PROBLEMS (unusual diarrhea, constipation, pain near the anus) TENDERNESS IN MOUTH AND THROAT WITH OR WITHOUT PRESENCE OF ULCERS (sore throat, sores in mouth, or a toothache) UNUSUAL RASH, SWELLING OR PAIN  UNUSUAL VAGINAL DISCHARGE OR ITCHING   Items with * indicate a potential emergency and should be followed up as soon as possible or go to the Emergency Department if any problems should occur.  Please show the CHEMOTHERAPY ALERT CARD or  IMMUNOTHERAPY ALERT CARD at check-in to the Emergency Department and triage nurse.  Should you have questions after your visit or need to cancel or reschedule your appointment, please contact [facility]  Dept: [phone]  and follow the prompts.  Office hours are 8:00 a.m. to 4:30 p.m. Monday - Friday. Please note that voicemails left after 4:00 p.m. may not be returned until the following business day.  We are closed weekends and major holidays. You have access to a nurse at all times for urgent questions. Please call the main number to the clinic Dept: [phone] and follow the prompts.   For any non-urgent questions, you may also contact your provider using MyChart. We now offer e-Visits for anyone 96 and older to request care online for non-urgent symptoms. For details visit [email or web address].   Also download the MyChart app! Go to the app store, search MyChart, open the app, select [place], and log in with your MyChart username and password.

## 2024-07-12 ENCOUNTER — Encounter: Payer: Self-pay | Admitting: Nurse Practitioner

## 2024-07-12 ENCOUNTER — Inpatient Hospital Stay (HOSPITAL_BASED_OUTPATIENT_CLINIC_OR_DEPARTMENT_OTHER): Payer: PRIVATE HEALTH INSURANCE | Admitting: Nurse Practitioner

## 2024-07-12 ENCOUNTER — Inpatient Hospital Stay: Payer: PRIVATE HEALTH INSURANCE

## 2024-07-12 VITALS — BP 113/78 | HR 98 | Temp 98.2°F | Resp 18

## 2024-07-12 DIAGNOSIS — C3411 Malignant neoplasm of upper lobe, right bronchus or lung: Secondary | ICD-10-CM | POA: Diagnosis not present

## 2024-07-12 DIAGNOSIS — Z515 Encounter for palliative care: Secondary | ICD-10-CM | POA: Diagnosis not present

## 2024-07-12 DIAGNOSIS — C7951 Secondary malignant neoplasm of bone: Secondary | ICD-10-CM | POA: Diagnosis not present

## 2024-07-12 DIAGNOSIS — R530 Neoplastic (malignant) related fatigue: Secondary | ICD-10-CM | POA: Diagnosis not present

## 2024-07-12 DIAGNOSIS — C787 Secondary malignant neoplasm of liver and intrahepatic bile duct: Secondary | ICD-10-CM | POA: Diagnosis not present

## 2024-07-12 DIAGNOSIS — Z5111 Encounter for antineoplastic chemotherapy: Secondary | ICD-10-CM | POA: Diagnosis not present

## 2024-07-12 DIAGNOSIS — G893 Neoplasm related pain (acute) (chronic): Secondary | ICD-10-CM | POA: Diagnosis not present

## 2024-07-12 MED ORDER — PROCHLORPERAZINE MALEATE 10 MG PO TABS
10.0000 mg | ORAL_TABLET | Freq: Once | ORAL | Status: AC
  Administered 2024-07-12: 10 mg via ORAL
  Filled 2024-07-12: qty 1

## 2024-07-12 MED ORDER — TRILACICLIB DIHYDROCHLORIDE INJECTION 300 MG
240.0000 mg/m2 | Freq: Once | INTRAVENOUS | Status: AC
  Administered 2024-07-12: 600 mg via INTRAVENOUS
  Filled 2024-07-12: qty 40

## 2024-07-12 MED ORDER — TOPOTECAN HCL CHEMO INJECTION 4 MG
1.0000 mg/m2 | Freq: Once | INTRAVENOUS | Status: AC
  Administered 2024-07-12: 2.4 mg via INTRAVENOUS
  Filled 2024-07-12: qty 2.4

## 2024-07-12 MED ORDER — SODIUM CHLORIDE 0.9 % IV SOLN: INTRAVENOUS | Status: DC

## 2024-07-12 NOTE — Progress Notes (Signed)
 Palliative Medicine [facility]  [phone] [phone]   Name: Adam Wise Date: 07/12/2024 MRN: [id number]  DOB: 1951/06/16  Patient Care Team: [name], NP as PCP - General (Internal Medicine) Anner Alm ORN, MD as PCP - Cardiology (Cardiology) Evertt Lonell BROCKS, RN as Oncology Nurse Navigator Pickenpack-Cousar, Fannie SAILOR, NP as Nurse Practitioner [facility] and Palliative Medicine)    INTERVAL HISTORY: Adam Wise is a 73 y.o. 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 Palliative ask to see for symptom management and goals of care.   SOCIAL HISTORY:     reports that he has quit smoking. He has never used smokeless tobacco. He reports that he does not currently use alcohol. He reports that he does not currently use drugs.  ADVANCE DIRECTIVES:  Advanced directives on file naming [name], [name], [name], and [name] as decision makers for this pt should Adam Wise become unable to speak for himself.   CODE STATUS: Full code  PAST MEDICAL HISTORY: Past Medical History:  Diagnosis Date   Acute on chronic respiratory failure with hypoxia (HCC) 10/12/2021   Chickenpox    Chronic knee pain    Chronic low back pain    COPD exacerbation (HCC) 10/11/2021   COPD with exacerbation (HCC) 10/12/2021   Coronary artery calcification seen on CAT scan 11/2021   Coronary CTA 11/27/2021: Moderate to large right pleural effusion and compressive atelectasis right lung base. => Coronary Calcium Score 657.  Diffuse RCA calcification.  Minimal mild disease in the LAD and diagonal branches. == Overall limited study.  Notable artifact.   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 He has disease progression after the first line of chemotherapy in December 2021.   Testosterone  deficiency    Type 2 diabetes mellitus (HCC)     ALLERGIES:  is allergic to bupropion and hydrochlorothiazide.  MEDICATIONS:  Current Outpatient Medications  Medication Sig Dispense Refill   albuterol  (VENTOLIN  HFA) 108 (90 Base) MCG/ACT inhaler Inhale 2 puffs into the lungs every 4 (four) hours as needed for shortness of breath. 18 each 0   amLODipine  (NORVASC ) 10 MG tablet Take 0.5 tablets (5 mg total) by mouth daily. TAKE 1 TABLET BY MOUTH EVERY DAY for blood pressure.     B-D 3CC LUER-LOK SYR 22GX1 22G X 1 3 ML MISC USE AS INSTRUCTED FOR TESTOSTERONE  INJECTION EVERY 2 WEEKS 10 each 2   Black Pepper-Turmeric (TURMERIC COMPLEX/BLACK PEPPER PO) Take 1 tablet by mouth in the morning and at bedtime.     Coenzyme Q10 (COQ10) 200 MG CAPS Take 200 mg by mouth at bedtime.     cyclobenzaprine  (FLEXERIL ) 5 MG tablet TAKE 1 TABLET BY MOUTH AT BEDTIME AS NEEDED FOR MUSCLE SPASMS. 90 tablet 0   docusate sodium  (COLACE) 100 MG capsule Take 1 capsule (100 mg total) by mouth every 12 (twelve) hours. 60 capsule 0   ELIQUIS  5 MG TABS tablet TAKE 1 TABLET BY MOUTH TWICE A DAY 180 tablet 2  esomeprazole (NEXIUM) 20 MG capsule Take 20 mg by mouth in the morning.     gabapentin  (NEURONTIN ) 300 MG capsule Take 1 capsule (300 mg total) by mouth 2 (two) times daily. For back pain. 180 capsule 1   glucose blood (ONETOUCH ULTRA) test strip USE UP TO 4 TIMES DAILY AS DIRECTED 400 strip 5   Insulin  Pen Needle (BD PEN NEEDLE NANO U/F) 32G X 4 MM MISC Use with insulin  as prescribed Dx Code: E11.9 100 each 3   ipratropium-albuterol  (DUONEB) 0.5-2.5 (3) MG/3ML SOLN Take 3 mLs by  nebulization every 4 (four) hours while awake for 3 days, THEN 3 mLs every 4 (four) hours as needed (shortness of breath or wheezing). 360 mL 0   KRILL OIL PO Take 1 capsule by mouth daily.     Lancets MISC USE UP TO 3 TIMES DAILY AS DIRECTED 100 each 2   lidocaine  (LIDODERM ) 5 % Place 1 patch onto the skin daily. Remove & Discard patch within 12 hours or as directed by MD 30 patch 0   losartan  (COZAAR ) 50 MG tablet TAKE 1 TABLET (50 MG TOTAL) BY MOUTH DAILY. FOR BLOOD PRESSURE. 90 tablet 3   Melatonin 10 MG CAPS Take 10 mg by mouth at bedtime as needed (for sleep).     metFORMIN  (GLUCOPHAGE ) 1000 MG tablet TAKE 1 TABLET (1,000 MG TOTAL) BY MOUTH 2 (TWO) TIMES DAILY WITH A MEAL. FOR DIABETES 180 tablet 1   mirtazapine  (REMERON ) 15 MG tablet TAKE 1 TABLET BY MOUTH EVERYDAY AT BEDTIME 90 tablet 1   morphine  (MS CONTIN ) 30 MG 12 hr tablet Take 1 tablet (30 mg total) by mouth every 8 (eight) hours. 90 tablet 0   Multiple Vitamin (MULTI-VITAMINS) TABS Take 1 tablet by mouth daily with breakfast.     Naloxone  HCl 3 MG/0.1ML LIQD Place 1 spray into both nostrils once. For known/suspected opiod overdose. Every 2-3 minutes in alternating nostril till EMS arrives.     nystatin  (MYCOSTATIN ) 100000 UNIT/ML suspension Take 5 mLs (500,000 Units total) by mouth 4 (four) times daily. 60 mL 0   ondansetron  (ZOFRAN ) 8 MG tablet Take 1 tablet (8 mg total) by mouth every 8 (eight) hours as needed for nausea or vomiting. 30 tablet 3   oxyCODONE -acetaminophen  (PERCOCET) 10-325 MG tablet Take 1 tablet by mouth every 4 (four) hours as needed. 60 tablet 0   pioglitazone  (ACTOS ) 45 MG tablet TAKE 1 TABLET (45 MG TOTAL) BY MOUTH DAILY. FOR DIABETES. 90 tablet 1   polyethylene glycol powder (GLYCOLAX /MIRALAX ) 17 GM/SCOOP powder Take by mouth once.     pravastatin  (PRAVACHOL ) 40 MG tablet TAKE 1 TABLET BY MOUTH EVERY DAY IN THE EVENING FOR CHOLESTEROL 90 tablet 3   PRESCRIPTION MEDICATION CPAP- At bedtime     testosterone   cypionate (DEPOTESTOSTERONE CYPIONATE) 200 MG/ML injection Inject 200 mg into the muscle every 14 (fourteen) days.     No current facility-administered medications for this visit.   Facility-Administered Medications Ordered in Other Visits  Medication Dose Route Frequency Provider Last Rate Last Admin   0.9 %  sodium chloride  infusion   Intravenous Continuous Sherrod Sherrod, MD   Stopped at 07/12/24 1505   heparin  lock flush 100 unit/mL  500 Units Intracatheter Once Sherrod Sherrod, MD       sodium chloride  flush (NS) 0.9 % injection 10 mL  10 mL Intracatheter Once Sherrod Sherrod, MD        VITAL SIGNS: There were no vitals taken for this visit.  There were no vitals filed for this visit.  Estimated body mass index is 35.97 kg/m as calculated from the following:   Height as of 06/20/24: 5' 9 (1.753 m).   Weight as of 07/11/24: 243 lb 9.6 oz (110.5 kg).   PERFORMANCE STATUS (ECOG) : 1 - Symptomatic but completely ambulatory   Physical Exam General: NAD Cardiovascular: regular rate and rhythm Pulmonary: normal breathing pattern Extremities: no edema, no joint deformities Skin: no rashes Neurological: AAO x3  IMPRESSION: Discussed the use of AI scribe software for clinical note transcription with the patient, who gave verbal consent to proceed.  History of Present Illness Bernarr Longsworth is a 73 year old male with lung cancer was seen during his infusion for symptom management follow-up.  Denies concerns of nausea, vomiting, constipation, or diarrhea. Is remaining as active as possible. Feels his appetite is improving. Current weight 243lbs.   Mr. Yom reports having a rough week on last week were his back pain had worsened and seemed uncontrolled despite pain regimen. He continued to take his medication as prescribed however he also was trying alternative measures such as muscle rubs, patches, heating pack, and cool packs. He found some relief with use of Liniment muscle  rub.   He shares pain has decreased some over the past several days returning to a more manageable state. Denies any recent injuries or activity to aggravate increased pain. He is currently taking MS Contin  30mg  every 12 hours and oxycodone  10mg  as needed. Some days pain is not as severe.   We discussed continuing to focus closely on his pain and symptoms. He will notify if pain is uncontrolled or worsens.   All questions answered and no adjustments to current regimen at this time.   I discussed the importance of continued conversation with family and their medical providers regarding overall plan of care and treatment options, ensuring decisions are within the context of the patients values and GOCs.  Assessment & Plan Cancer Related Pain  Patient reports back pain is well controlled.  - Continue current pain management regimen with Tylenol  as needed. - Use Percocet sparingly for breakthrough pain. -Continue MS Contin  30 mg every 8-12 hours -Miralax  daily for bowel regimen   Follow-up Follow-up planned in a couple of weeks unless needed sooner. - Schedule follow-up in 3-4 weeks. Sooner if needed.   Patient expressed understanding and was in agreement with this plan. He also understands that He can call the clinic at any time with any questions, concerns, or complaints.   Any controlled substances utilized were prescribed in the context of palliative care. PDMP has been reviewed.   Visit consisted of counseling and education dealing with the complex and emotionally intense issues of symptom management and palliative care in the setting of serious and potentially life-threatening illness.  Levon Borer, AGPCNP-BC  Palliative Medicine Team/[facility]

## 2024-07-12 NOTE — Patient Instructions (Signed)
 [facility] - A DEPT OF [place]  Discharge Instructions: Thank you for choosing [facility] to provide your oncology and hematology care.   If you have a lab appointment with the Cancer Center, please go directly to the Cancer Center and check in at the registration area.   Wear comfortable clothing and clothing appropriate for easy access to any Portacath or PICC line.   We strive to give you quality time with your provider. You may need to reschedule your appointment if you arrive late (15 or more minutes).  Arriving late affects you and other patients whose appointments are after yours.  Also, if you miss three or more appointments without notifying the office, you may be dismissed from the clinic at the provider's discretion.      For prescription refill requests, have your pharmacy contact our office and allow 72 hours for refills to be completed.    Today you received the following chemotherapy and/or immunotherapy agents: Cosela . Hycamtin       To help prevent nausea and vomiting after your treatment, we encourage you to take your nausea medication as directed.  BELOW ARE SYMPTOMS THAT SHOULD BE REPORTED IMMEDIATELY: *FEVER GREATER THAN 100.4 F (38 C) OR HIGHER *CHILLS OR SWEATING *NAUSEA AND VOMITING THAT IS NOT CONTROLLED WITH YOUR NAUSEA MEDICATION *UNUSUAL SHORTNESS OF BREATH *UNUSUAL BRUISING OR BLEEDING *URINARY PROBLEMS (pain or burning when urinating, or frequent urination) *BOWEL PROBLEMS (unusual diarrhea, constipation, pain near the anus) TENDERNESS IN MOUTH AND THROAT WITH OR WITHOUT PRESENCE OF ULCERS (sore throat, sores in mouth, or a toothache) UNUSUAL RASH, SWELLING OR PAIN  UNUSUAL VAGINAL DISCHARGE OR ITCHING   Items with * indicate a potential emergency and should be followed up as soon as possible or go to the Emergency Department if any problems should occur.  Please show the CHEMOTHERAPY ALERT CARD or  IMMUNOTHERAPY ALERT CARD at check-in to the Emergency Department and triage nurse.  Should you have questions after your visit or need to cancel or reschedule your appointment, please contact [facility]  Dept: [phone]  and follow the prompts.  Office hours are 8:00 a.m. to 4:30 p.m. Monday - Friday. Please note that voicemails left after 4:00 p.m. may not be returned until the following business day.  We are closed weekends and major holidays. You have access to a nurse at all times for urgent questions. Please call the main number to the clinic Dept: [phone] and follow the prompts.   For any non-urgent questions, you may also contact your provider using MyChart. We now offer e-Visits for anyone 27 and older to request care online for non-urgent symptoms. For details visit [email or web address].   Also download the MyChart app! Go to the app store, search MyChart, open the app, select [place], and log in with your MyChart username and password.

## 2024-07-13 ENCOUNTER — Inpatient Hospital Stay: Payer: PRIVATE HEALTH INSURANCE

## 2024-07-13 VITALS — BP 135/73 | HR 110 | Temp 98.5°F | Resp 16

## 2024-07-13 DIAGNOSIS — G893 Neoplasm related pain (acute) (chronic): Secondary | ICD-10-CM | POA: Diagnosis not present

## 2024-07-13 DIAGNOSIS — C787 Secondary malignant neoplasm of liver and intrahepatic bile duct: Secondary | ICD-10-CM | POA: Diagnosis not present

## 2024-07-13 DIAGNOSIS — C3411 Malignant neoplasm of upper lobe, right bronchus or lung: Secondary | ICD-10-CM | POA: Diagnosis not present

## 2024-07-13 DIAGNOSIS — Z5111 Encounter for antineoplastic chemotherapy: Secondary | ICD-10-CM | POA: Diagnosis not present

## 2024-07-13 DIAGNOSIS — C7951 Secondary malignant neoplasm of bone: Secondary | ICD-10-CM | POA: Diagnosis not present

## 2024-07-13 DIAGNOSIS — Z515 Encounter for palliative care: Secondary | ICD-10-CM | POA: Diagnosis not present

## 2024-07-13 MED ORDER — TOPOTECAN HCL CHEMO INJECTION 4 MG/4ML
1.0000 mg/m2 | Freq: Once | INTRAVENOUS | Status: AC
  Administered 2024-07-13: 2.4 mg via INTRAVENOUS
  Filled 2024-07-13: qty 2.4

## 2024-07-13 MED ORDER — SODIUM CHLORIDE 0.9% FLUSH: 10.0000 mL | INTRAVENOUS | Status: DC | PRN

## 2024-07-13 MED ORDER — TRILACICLIB DIHYDROCHLORIDE INJECTION 300 MG
240.0000 mg/m2 | Freq: Once | INTRAVENOUS | Status: AC
  Administered 2024-07-13: 600 mg via INTRAVENOUS
  Filled 2024-07-13: qty 40

## 2024-07-13 MED ORDER — SODIUM CHLORIDE 0.9 % IV SOLN: INTRAVENOUS | Status: DC

## 2024-07-13 MED ORDER — PROCHLORPERAZINE MALEATE 10 MG PO TABS
10.0000 mg | ORAL_TABLET | Freq: Once | ORAL | Status: AC
Start: 2024-07-13 — End: 2024-07-13
  Administered 2024-07-13: 10 mg via ORAL
  Filled 2024-07-13: qty 1

## 2024-07-13 NOTE — Patient Instructions (Addendum)
 [facility] - A DEPT OF [facility]  Discharge Instructions: Thank you for choosing [facility] to provide your oncology and hematology care.   If you have a lab appointment with the Cancer Center, please go directly to the Cancer Center and check in at the registration area.   Wear comfortable clothing and clothing appropriate for easy access to any Portacath or PICC line.   We strive to give you quality time with your provider. You may need to reschedule your appointment if you arrive late (15 or more minutes).  Arriving late affects you and other patients whose appointments are after yours.  Also, if you miss three or more appointments without notifying the office, you may be dismissed from the clinic at the provider's discretion.      For prescription refill requests, have your pharmacy contact our office and allow 72 hours for refills to be completed.    Today you received the following chemotherapy and/or immunotherapy agents: Hycamtin  & Cosela     To help prevent nausea and vomiting after your treatment, we encourage you to take your nausea medication as directed.  BELOW ARE SYMPTOMS THAT SHOULD BE REPORTED IMMEDIATELY: *FEVER GREATER THAN 100.4 F (38 C) OR HIGHER *CHILLS OR SWEATING *NAUSEA AND VOMITING THAT IS NOT CONTROLLED WITH YOUR NAUSEA MEDICATION *UNUSUAL SHORTNESS OF BREATH *UNUSUAL BRUISING OR BLEEDING *URINARY PROBLEMS (pain or burning when urinating, or frequent urination) *BOWEL PROBLEMS (unusual diarrhea, constipation, pain near the anus) TENDERNESS IN MOUTH AND THROAT WITH OR WITHOUT PRESENCE OF ULCERS (sore throat, sores in mouth, or a toothache) UNUSUAL RASH, SWELLING OR PAIN  UNUSUAL VAGINAL DISCHARGE OR ITCHING   Items with * indicate a potential emergency and should be followed up as soon as possible or go to the Emergency Department if any problems should occur.  Please show the CHEMOTHERAPY ALERT CARD or  IMMUNOTHERAPY ALERT CARD at check-in to the Emergency Department and triage nurse.  Should you have questions after your visit or need to cancel or reschedule your appointment, please contact [facility]  Dept: [phone]  and follow the prompts.  Office hours are 8:00 a.m. to 4:30 p.m. Monday - Friday. Please note that voicemails left after 4:00 p.m. may not be returned until the following business day.  We are closed weekends and major holidays. You have access to a nurse at all times for urgent questions. Please call the main number to the clinic Dept: [phone] and follow the prompts.   For any non-urgent questions, you may also contact your provider using MyChart. We now offer e-Visits for anyone 45 and older to request care online for non-urgent symptoms. For details visit [email or web address].   Also download the MyChart app! Go to the app store, search MyChart, open the app, select [place], and log in with your MyChart username and password.

## 2024-07-13 NOTE — Progress Notes (Signed)
 Per Cassie H PA, ok to treat with HR 110 today.

## 2024-07-14 ENCOUNTER — Encounter: Payer: Self-pay | Admitting: Internal Medicine

## 2024-07-14 ENCOUNTER — Inpatient Hospital Stay: Payer: PRIVATE HEALTH INSURANCE

## 2024-07-14 VITALS — BP 105/68 | HR 91 | Temp 98.3°F | Resp 16 | Wt 241.8 lb

## 2024-07-14 DIAGNOSIS — C787 Secondary malignant neoplasm of liver and intrahepatic bile duct: Secondary | ICD-10-CM | POA: Diagnosis not present

## 2024-07-14 DIAGNOSIS — G893 Neoplasm related pain (acute) (chronic): Secondary | ICD-10-CM | POA: Diagnosis not present

## 2024-07-14 DIAGNOSIS — Z5111 Encounter for antineoplastic chemotherapy: Secondary | ICD-10-CM | POA: Diagnosis not present

## 2024-07-14 DIAGNOSIS — Z515 Encounter for palliative care: Secondary | ICD-10-CM | POA: Diagnosis not present

## 2024-07-14 DIAGNOSIS — C3411 Malignant neoplasm of upper lobe, right bronchus or lung: Secondary | ICD-10-CM

## 2024-07-14 DIAGNOSIS — C7951 Secondary malignant neoplasm of bone: Secondary | ICD-10-CM | POA: Diagnosis not present

## 2024-07-14 MED ORDER — SODIUM CHLORIDE 0.9 % IV SOLN: INTRAVENOUS | Status: DC

## 2024-07-14 MED ORDER — PROCHLORPERAZINE MALEATE 10 MG PO TABS
10.0000 mg | ORAL_TABLET | Freq: Once | ORAL | Status: AC
  Administered 2024-07-14: 10 mg via ORAL
  Filled 2024-07-14: qty 1

## 2024-07-14 MED ORDER — TOPOTECAN HCL CHEMO INJECTION 4 MG/4ML
1.0000 mg/m2 | Freq: Once | INTRAVENOUS | Status: AC
  Administered 2024-07-14: 2.4 mg via INTRAVENOUS
  Filled 2024-07-14: qty 2.4

## 2024-07-14 MED ORDER — TRILACICLIB DIHYDROCHLORIDE INJECTION 300 MG
240.0000 mg/m2 | Freq: Once | INTRAVENOUS | Status: AC
  Administered 2024-07-14: 600 mg via INTRAVENOUS
  Filled 2024-07-14: qty 40

## 2024-07-14 MED ORDER — SODIUM CHLORIDE 0.9% FLUSH
10.0000 mL | INTRAVENOUS | Status: DC | PRN
  Administered 2024-07-14: 10 mL

## 2024-07-14 NOTE — Patient Instructions (Signed)
 [facility] - A DEPT OF [facility]  Discharge Instructions: Thank you for choosing [facility] to provide your oncology and hematology care.   If you have a lab appointment with the Cancer Center, please go directly to the Cancer Center and check in at the registration area.   Wear comfortable clothing and clothing appropriate for easy access to any Portacath or PICC line.   We strive to give you quality time with your provider. You may need to reschedule your appointment if you arrive late (15 or more minutes).  Arriving late affects you and other patients whose appointments are after yours.  Also, if you miss three or more appointments without notifying the office, you may be dismissed from the clinic at the provider's discretion.      For prescription refill requests, have your pharmacy contact our office and allow 72 hours for refills to be completed.    Today you received the following chemotherapy and/or immunotherapy agents: Cosela , Topotecan .    To help prevent nausea and vomiting after your treatment, we encourage you to take your nausea medication as directed.  BELOW ARE SYMPTOMS THAT SHOULD BE REPORTED IMMEDIATELY: *FEVER GREATER THAN 100.4 F (38 C) OR HIGHER *CHILLS OR SWEATING *NAUSEA AND VOMITING THAT IS NOT CONTROLLED WITH YOUR NAUSEA MEDICATION *UNUSUAL SHORTNESS OF BREATH *UNUSUAL BRUISING OR BLEEDING *URINARY PROBLEMS (pain or burning when urinating, or frequent urination) *BOWEL PROBLEMS (unusual diarrhea, constipation, pain near the anus) TENDERNESS IN MOUTH AND THROAT WITH OR WITHOUT PRESENCE OF ULCERS (sore throat, sores in mouth, or a toothache) UNUSUAL RASH, SWELLING OR PAIN  UNUSUAL VAGINAL DISCHARGE OR ITCHING   Items with * indicate a potential emergency and should be followed up as soon as possible or go to the Emergency Department if any problems should occur.  Please show the CHEMOTHERAPY ALERT CARD or  IMMUNOTHERAPY ALERT CARD at check-in to the Emergency Department and triage nurse.  Should you have questions after your visit or need to cancel or reschedule your appointment, please contact [facility] - A DEPT [facility]  Dept: [phone]  and follow the prompts.  Office hours are 8:00 a.m. to 4:30 p.m. Monday - Friday. Please note that voicemails left after 4:00 p.m. may not be returned until the following business day.  We are closed weekends and major holidays. You have access to a nurse at all times for urgent questions. Please call the main number to the clinic Dept: [phone] and follow the prompts.   For any non-urgent questions, you may also contact your provider using MyChart. We now offer e-Visits for anyone 31 and older to request care online for non-urgent symptoms. For details visit [email or web address].   Also download the MyChart app! Go to the app store, search MyChart, open the app, select [facility], and log in with your MyChart username and password.  Trilaciclib Injection What is this medication? TRILACICLIB (TRYE la SYE klib) prevents low levels of red blood cells, white blood cells, and platelets caused by chemotherapy. It works by protecting the cells in your bone marrow that make these blood cells. This lowers the risk of infection and bleeding. It also reduces the need for blood transfusions. This medicine may be used for other purposes; ask your health care provider or pharmacist if you have questions. COMMON BRAND NAME(S): COSELA  What should I tell my care team before I take this medication? They  need to know if you have any of these conditions: Liver disease An unusual or allergic reaction to trilaciclib, other medications, foods, dyes, or preservatives Pregnant or trying to get pregnant Breastfeeding How should I use this medication? This medication is injected into a vein. It is given by your care team in a hospital  or clinic setting. Talk to your care team about the use of this medication in children. Special care may be needed. Overdosage: If you think you have taken too much of this medicine contact a poison control center or emergency room at once. NOTE: This medicine is only for you. Do not share this medicine with others. What if I miss a dose? Keep appointments for follow-up doses. It is important to not miss your dose. Call your care team if you are unable to keep an appointment. What may interact with this medication? Cisplatin Dalfampridine Dofetilide Metformin  This list may not describe all possible interactions. Give your health care provider a list of all the medicines, herbs, non-prescription drugs, or dietary supplements you use. Also tell them if you smoke, drink alcohol, or use illegal drugs. Some items may interact with your medicine. What should I watch for while using this medication? Your condition will be monitored carefully while you are receiving this medication. Talk to your care team if you may be pregnant. Serious birth defects can occur if you take this medication during pregnancy and for 3 weeks after the last dose. You will need a negative pregnancy test before starting this medication. Contraception is recommended while taking this medication and for 3 weeks after the last dose. Your care team can help you find the option that works for you. Do not breastfeed while taking this medication and for 3 weeks after the last dose. This medication may cause infertility. Talk to your care team if you are concerned about your fertility. What side effects may I notice from receiving this medication? Side effects that you should report to your care team as soon as possible: Allergic reactions--skin rash, itching, hives, swelling of the face, lips, tongue, or throat Dry cough, shortness of breath or trouble breathing Painful swelling, warmth, or redness of the skin, blisters or sores at  the infusion site Side effects that usually do not require medical attention (report these to your care team if they continue or are bothersome): Fatigue Headache This list may not describe all possible side effects. Call your doctor for medical advice about side effects. You may report side effects to FDA at 1-800-FDA-1088. Where should I keep my medication? This medication is given in a hospital or clinic. It will not be stored at home. NOTE: This sheet is a summary. It may not cover all possible information. If you have questions about this medicine, talk to your doctor, pharmacist, or health care provider.  2024 Elsevier/Gold Standard (2023-11-13 00:00:00)  Topotecan  Capsules What is this medication? TOPOTECAN  (TOE poe TEE kan) treats lung cancer. It works by slowing down the growth of cancer cells. This medicine may be used for other purposes; ask your health care provider or pharmacist if you have questions. COMMON BRAND NAME(S): Hycamtin  What should I tell my care team before I take this medication? They need to know if you have any of these conditions: Immune system problems Infection, especially a viral infection, such as chickenpox, cold sores, herpes Kidney disease Low blood cell levels (white cells, red cells, and platelets) Lung disease An unusual or allergic reaction to topotecan , other medications, foods, dyes,  or preservatives If you or your partner are pregnant or trying to get pregnant Breast-feeding How should I use this medication? Take this medication by mouth with a glass of water. Take it as directed on the prescription label. You can take it with or without food. If it upsets your stomach, take it with food. Do not cut, crush, or chew this medication. Keep taking it unless your care team tells you to stop. Talk to your care team the use of this medication in children. Special care may be needed. Overdosage: If you think you have taken too much of this medicine  contact a poison control center or emergency room at once. NOTE: This medicine is only for you. Do not share this medicine with others. What if I miss a dose? If you miss a dose, take it as soon as you can. If it is almost time for your next dose, take only that dose. Do not take double or extra doses. What may interact with this medication? Amiodarone Captopril Carvedilol Certain antibiotics, such as azithromycin , clarithromycin, erythromycin, Certain medications for fungal infections, such as ketoconazole, itraconazole Conivaptan Cyclosporine Dronedarone Eltrombopag Felodipine Grapefruit juice Lopinavir Quercetin Quinidine Ranolazine Ritonavir Ticagrelor Verapamil This list may not describe all possible interactions. Give your health care provider a list of all the medicines, herbs, non-prescription drugs, or dietary supplements you use. Also tell them if you smoke, drink alcohol, or use illegal drugs. Some items may interact with your medicine. What should I watch for while using this medication? Your condition will be monitored carefully while you are receiving this medication. This medication may make you feel generally unwell. This is not uncommon as chemotherapy can affect healthy cells as well as cancer cells. Report any side effects. Continue your course of treatment even though you feel ill unless your care team tells you to stop. This medication may increase your risk of getting an infection. Call your care team for advice if you get a fever, chills, sore throat, or other symptoms of a cold or flu. Do not treat yourself. Try to avoid being around people who are sick. This medication may increase your risk to bruise or bleed. Call your care team if you notice any unusual bleeding. Be careful brushing or flossing your teeth or using a toothpick because you may get an infection or bleed more easily. If you have any dental work done, tell your dentist you are receiving this  medication. Avoid taking medications that contain aspirin , acetaminophen , ibuprofen, naproxen, or ketoprofen unless instructed by your care team. These medications may hide a fever. Talk to your care team if you or your partner may be pregnant. Serious birth defects can occur if you take this medication during pregnancy and for 6 months after the last dose. You will need a negative pregnancy test before starting this medication. Contraception is recommended while taking this medication and for 6 months after the last dose. Your care team can help you find the option that works for you. If your partner can get pregnant, use a condom during sex while taking this medication and for 3 months after the last dose. Do not breastfeed while taking this medication. This medication may cause infertility. Talk to your care team if you are concerned about your fertility. What side effects may I notice from receiving this medication? Side effects that you should report to your care team as soon as possible: Allergic reactions--skin rash, itching, hives, swelling of the face, lips, tongue, or throat Dry  cough, shortness of breath or trouble breathing Infection--fever, chills, cough, sore throat, wounds that don't heal, pain or trouble when passing urine, general feeling of discomfort or being unwell Low red blood cell level--unusual weakness or fatigue, dizziness, headache, trouble breathing Severe or prolonged diarrhea Unusual bruising or bleeding Side effects that usually do not require medical attention (report to your care team if they continue or are bothersome): Fatigue Hair loss Loss of appetite Nausea Vomiting This list may not describe all possible side effects. Call your doctor for medical advice about side effects. You may report side effects to FDA at 1-800-FDA-1088. Where should I keep my medication? Keep out of the reach of children and pets. Store in the refrigerator. Keep this medication in  the original container. Protect from light. Get rid of any unused medication after the expiration date. To get rid of medications that are no longer needed or have expired: Take the medication to a medication take-back program. Check with your pharmacy or law enforcement to find a location. If you cannot return the medication, ask your pharmacist or care team how to get rid of this medication safely. NOTE: This sheet is a summary. It may not cover all possible information. If you have questions about this medicine, talk to your doctor, pharmacist, or health care provider.  2024 Elsevier/Gold Standard (2022-04-14 00:00:00)

## 2024-07-15 ENCOUNTER — Inpatient Hospital Stay: Payer: PRIVATE HEALTH INSURANCE | Attending: Radiation Oncology

## 2024-07-15 ENCOUNTER — Other Ambulatory Visit: Payer: Self-pay | Admitting: Primary Care

## 2024-07-15 VITALS — BP 100/76 | HR 106 | Temp 98.7°F | Resp 18

## 2024-07-15 DIAGNOSIS — C787 Secondary malignant neoplasm of liver and intrahepatic bile duct: Secondary | ICD-10-CM | POA: Diagnosis not present

## 2024-07-15 DIAGNOSIS — C3411 Malignant neoplasm of upper lobe, right bronchus or lung: Secondary | ICD-10-CM | POA: Insufficient documentation

## 2024-07-15 DIAGNOSIS — E119 Type 2 diabetes mellitus without complications: Secondary | ICD-10-CM | POA: Insufficient documentation

## 2024-07-15 DIAGNOSIS — C7951 Secondary malignant neoplasm of bone: Secondary | ICD-10-CM | POA: Insufficient documentation

## 2024-07-15 DIAGNOSIS — E785 Hyperlipidemia, unspecified: Secondary | ICD-10-CM | POA: Insufficient documentation

## 2024-07-15 DIAGNOSIS — Z515 Encounter for palliative care: Secondary | ICD-10-CM | POA: Insufficient documentation

## 2024-07-15 DIAGNOSIS — G893 Neoplasm related pain (acute) (chronic): Secondary | ICD-10-CM | POA: Insufficient documentation

## 2024-07-15 DIAGNOSIS — Z5111 Encounter for antineoplastic chemotherapy: Secondary | ICD-10-CM | POA: Diagnosis not present

## 2024-07-15 MED ORDER — TRILACICLIB DIHYDROCHLORIDE INJECTION 300 MG
240.0000 mg/m2 | Freq: Once | INTRAVENOUS | Status: AC
  Administered 2024-07-15: 600 mg via INTRAVENOUS
  Filled 2024-07-15: qty 40

## 2024-07-15 MED ORDER — TOPOTECAN HCL CHEMO INJECTION 4 MG/4ML
1.0000 mg/m2 | Freq: Once | INTRAVENOUS | Status: AC
  Administered 2024-07-15: 2.4 mg via INTRAVENOUS
  Filled 2024-07-15: qty 2.4

## 2024-07-15 MED ORDER — SODIUM CHLORIDE 0.9 % IV SOLN: INTRAVENOUS | Status: DC

## 2024-07-15 MED ORDER — HEPARIN SOD (PORK) LOCK FLUSH 100 UNIT/ML IV SOLN
500.0000 [IU] | Freq: Once | INTRAVENOUS | Status: AC | PRN
Start: 2024-07-15 — End: 2024-07-15
  Administered 2024-07-15: 500 [IU]

## 2024-07-15 MED ORDER — PROCHLORPERAZINE MALEATE 10 MG PO TABS
10.0000 mg | ORAL_TABLET | Freq: Once | ORAL | Status: AC
  Administered 2024-07-15: 10 mg via ORAL
  Filled 2024-07-15: qty 1

## 2024-07-15 MED ORDER — SODIUM CHLORIDE 0.9% FLUSH
10.0000 mL | INTRAVENOUS | Status: DC | PRN
  Administered 2024-07-15: 10 mL

## 2024-07-15 NOTE — Patient Instructions (Signed)
 [facility] - A DEPT OF [facility]  Discharge Instructions: Thank you for choosing [facility] to provide your oncology and hematology care.   If you have a lab appointment with the Cancer Center, please go directly to the Cancer Center and check in at the registration area.   Wear comfortable clothing and clothing appropriate for easy access to any Portacath or PICC line.   We strive to give you quality time with your provider. You may need to reschedule your appointment if you arrive late (15 or more minutes).  Arriving late affects you and other patients whose appointments are after yours.  Also, if you miss three or more appointments without notifying the office, you may be dismissed from the clinic at the provider's discretion.      For prescription refill requests, have your pharmacy contact our office and allow 72 hours for refills to be completed.    Today you received the following chemotherapy and/or immunotherapy agents: Hycamtin , Cosela       To help prevent nausea and vomiting after your treatment, we encourage you to take your nausea medication as directed.  BELOW ARE SYMPTOMS THAT SHOULD BE REPORTED IMMEDIATELY: *FEVER GREATER THAN 100.4 F (38 C) OR HIGHER *CHILLS OR SWEATING *NAUSEA AND VOMITING THAT IS NOT CONTROLLED WITH YOUR NAUSEA MEDICATION *UNUSUAL SHORTNESS OF BREATH *UNUSUAL BRUISING OR BLEEDING *URINARY PROBLEMS (pain or burning when urinating, or frequent urination) *BOWEL PROBLEMS (unusual diarrhea, constipation, pain near the anus) TENDERNESS IN MOUTH AND THROAT WITH OR WITHOUT PRESENCE OF ULCERS (sore throat, sores in mouth, or a toothache) UNUSUAL RASH, SWELLING OR PAIN  UNUSUAL VAGINAL DISCHARGE OR ITCHING   Items with * indicate a potential emergency and should be followed up as soon as possible or go to the Emergency Department if any problems should occur.  Please show the CHEMOTHERAPY ALERT CARD or  IMMUNOTHERAPY ALERT CARD at check-in to the Emergency Department and triage nurse.  Should you have questions after your visit or need to cancel or reschedule your appointment, please contact [facility]  Dept: [phone]  and follow the prompts.  Office hours are 8:00 a.m. to 4:30 p.m. Monday - Friday. Please note that voicemails left after 4:00 p.m. may not be returned until the following business day.  We are closed weekends and major holidays. You have access to a nurse at all times for urgent questions. Please call the main number to the clinic Dept: [phone] and follow the prompts.   For any non-urgent questions, you may also contact your provider using MyChart. We now offer e-Visits for anyone 43 and older to request care online for non-urgent symptoms. For details visit [email or web address].   Also download the MyChart app! Go to the app store, search MyChart, open the app, select [facility], and log in with your MyChart username and password.

## 2024-07-15 NOTE — Progress Notes (Signed)
 Per Cassie Heilingoetter, PA, OK to Tx pulse 106.

## 2024-07-18 ENCOUNTER — Other Ambulatory Visit: Payer: PRIVATE HEALTH INSURANCE

## 2024-07-18 ENCOUNTER — Ambulatory Visit: Payer: PRIVATE HEALTH INSURANCE

## 2024-07-18 ENCOUNTER — Inpatient Hospital Stay

## 2024-07-18 ENCOUNTER — Ambulatory Visit: Payer: PRIVATE HEALTH INSURANCE | Admitting: Internal Medicine

## 2024-07-18 ENCOUNTER — Telehealth: Payer: Self-pay | Admitting: Medical Oncology

## 2024-07-18 DIAGNOSIS — C3411 Malignant neoplasm of upper lobe, right bronchus or lung: Secondary | ICD-10-CM

## 2024-07-18 DIAGNOSIS — C787 Secondary malignant neoplasm of liver and intrahepatic bile duct: Secondary | ICD-10-CM | POA: Diagnosis not present

## 2024-07-18 DIAGNOSIS — C7951 Secondary malignant neoplasm of bone: Secondary | ICD-10-CM | POA: Diagnosis not present

## 2024-07-18 DIAGNOSIS — Z5111 Encounter for antineoplastic chemotherapy: Secondary | ICD-10-CM | POA: Diagnosis not present

## 2024-07-18 DIAGNOSIS — Z515 Encounter for palliative care: Secondary | ICD-10-CM | POA: Diagnosis not present

## 2024-07-18 DIAGNOSIS — D649 Anemia, unspecified: Secondary | ICD-10-CM

## 2024-07-18 DIAGNOSIS — D696 Thrombocytopenia, unspecified: Secondary | ICD-10-CM

## 2024-07-18 DIAGNOSIS — G893 Neoplasm related pain (acute) (chronic): Secondary | ICD-10-CM | POA: Diagnosis not present

## 2024-07-18 LAB — CBC WITH DIFFERENTIAL (CANCER CENTER ONLY)
Basophils Absolute: 0 K/uL (ref 0.0–0.1)
Basophils Relative: 3 %
Eosinophils Absolute: 0 K/uL (ref 0.0–0.5)
Eosinophils Relative: 2 %
HCT: 25.1 % — ABNORMAL LOW (ref 39.0–52.0)
Hemoglobin: 8.2 g/dL — ABNORMAL LOW (ref 13.0–17.0)
Lymphocytes Relative: 18 %
Lymphs Abs: 0.2 K/uL — ABNORMAL LOW (ref 0.7–4.0)
MCH: 31.1 pg (ref 26.0–34.0)
MCHC: 32.7 g/dL (ref 30.0–36.0)
MCV: 95.1 fL (ref 80.0–100.0)
Monocytes Absolute: 0 K/uL — ABNORMAL LOW (ref 0.1–1.0)
Monocytes Relative: 4 %
Neutro Abs: 0.7 K/uL — ABNORMAL LOW (ref 1.7–7.7)
Neutrophils Relative %: 73 %
Platelet Count: 137 K/uL — ABNORMAL LOW (ref 150–400)
RBC: 2.64 MIL/uL — ABNORMAL LOW (ref 4.22–5.81)
RDW: 16.3 % — ABNORMAL HIGH (ref 11.5–15.5)
Smear Review: NORMAL
WBC Count: 1 K/uL — ABNORMAL LOW (ref 4.0–10.5)
nRBC: 0 % (ref 0.0–0.2)

## 2024-07-18 LAB — CMP (CANCER CENTER ONLY)
ALT: 14 U/L (ref 0–44)
AST: 19 U/L (ref 15–41)
Albumin: 3.4 g/dL — ABNORMAL LOW (ref 3.5–5.0)
Alkaline Phosphatase: 119 U/L (ref 38–126)
Anion gap: 7 (ref 5–15)
BUN: 20 mg/dL (ref 8–23)
CO2: 28 mmol/L (ref 22–32)
Calcium: 8.9 mg/dL (ref 8.9–10.3)
Chloride: 102 mmol/L (ref 98–111)
Creatinine: 1.06 mg/dL (ref 0.61–1.24)
GFR, Estimated: >60 mL/min (ref >60)
Glucose, Bld: 153 mg/dL — ABNORMAL HIGH (ref 70–99)
Potassium: 4.6 mmol/L (ref 3.5–5.1)
Sodium: 137 mmol/L (ref 135–145)
Total Bilirubin: 0.6 mg/dL (ref 0.0–1.2)
Total Protein: 6.1 g/dL — ABNORMAL LOW (ref 6.5–8.1)

## 2024-07-18 LAB — SAMPLE TO BLOOD BANK

## 2024-07-18 NOTE — Telephone Encounter (Addendum)
 Weekly lab  CRITICAL VALUE STICKER  CRITICAL VALUE: WBC=1.0, Plts=137k, ANC pending  RECEIVER (on-site recipient of call):Maritssa Haughton, RN  DATE & TIME NOTIFIED: 07/18/2024 @ 1300  MESSENGER (representative from lab):[place]  MD NOTIFIED: Heilingoetter, PA-C  TIME OF NOTIFICATION:1315  RESPONSE:  scheduled to see pt.

## 2024-07-18 NOTE — Telephone Encounter (Signed)
 Lab results- Neutropenic precautions reviewed  with wife. I told wife that pt does not need a blood transfusion at this time. She voiced understanding.

## 2024-07-19 ENCOUNTER — Encounter: Payer: Self-pay | Admitting: Internal Medicine

## 2024-07-19 ENCOUNTER — Ambulatory Visit: Payer: PRIVATE HEALTH INSURANCE

## 2024-07-20 ENCOUNTER — Ambulatory Visit: Payer: PRIVATE HEALTH INSURANCE

## 2024-07-21 ENCOUNTER — Ambulatory Visit: Payer: PRIVATE HEALTH INSURANCE

## 2024-07-22 ENCOUNTER — Ambulatory Visit: Payer: PRIVATE HEALTH INSURANCE

## 2024-07-25 ENCOUNTER — Other Ambulatory Visit: Payer: Self-pay | Admitting: Medical Oncology

## 2024-07-25 ENCOUNTER — Other Ambulatory Visit: Payer: PRIVATE HEALTH INSURANCE

## 2024-07-25 ENCOUNTER — Encounter: Payer: Self-pay | Admitting: Internal Medicine

## 2024-07-25 ENCOUNTER — Inpatient Hospital Stay

## 2024-07-25 ENCOUNTER — Ambulatory Visit: Payer: PRIVATE HEALTH INSURANCE | Admitting: Internal Medicine

## 2024-07-25 ENCOUNTER — Ambulatory Visit: Payer: PRIVATE HEALTH INSURANCE

## 2024-07-25 ENCOUNTER — Telehealth: Payer: Self-pay | Admitting: Medical Oncology

## 2024-07-25 DIAGNOSIS — Z5111 Encounter for antineoplastic chemotherapy: Secondary | ICD-10-CM | POA: Diagnosis not present

## 2024-07-25 DIAGNOSIS — D696 Thrombocytopenia, unspecified: Secondary | ICD-10-CM

## 2024-07-25 DIAGNOSIS — G893 Neoplasm related pain (acute) (chronic): Secondary | ICD-10-CM | POA: Diagnosis not present

## 2024-07-25 DIAGNOSIS — C3411 Malignant neoplasm of upper lobe, right bronchus or lung: Secondary | ICD-10-CM

## 2024-07-25 DIAGNOSIS — D649 Anemia, unspecified: Secondary | ICD-10-CM

## 2024-07-25 DIAGNOSIS — C787 Secondary malignant neoplasm of liver and intrahepatic bile duct: Secondary | ICD-10-CM | POA: Diagnosis not present

## 2024-07-25 DIAGNOSIS — Z515 Encounter for palliative care: Secondary | ICD-10-CM | POA: Diagnosis not present

## 2024-07-25 DIAGNOSIS — C7951 Secondary malignant neoplasm of bone: Secondary | ICD-10-CM | POA: Diagnosis not present

## 2024-07-25 LAB — CMP (CANCER CENTER ONLY)
ALT: 16 U/L (ref 0–44)
AST: 23 U/L (ref 15–41)
Albumin: 3.3 g/dL — ABNORMAL LOW (ref 3.5–5.0)
Alkaline Phosphatase: 113 U/L (ref 38–126)
Anion gap: 7 (ref 5–15)
BUN: 16 mg/dL (ref 8–23)
CO2: 28 mmol/L (ref 22–32)
Calcium: 8.7 mg/dL — ABNORMAL LOW (ref 8.9–10.3)
Chloride: 103 mmol/L (ref 98–111)
Creatinine: 1.07 mg/dL (ref 0.61–1.24)
GFR, Estimated: >60 mL/min (ref >60)
Glucose, Bld: 180 mg/dL — ABNORMAL HIGH (ref 70–99)
Potassium: 4.9 mmol/L (ref 3.5–5.1)
Sodium: 138 mmol/L (ref 135–145)
Total Bilirubin: 0.4 mg/dL (ref 0.0–1.2)
Total Protein: 5.8 g/dL — ABNORMAL LOW (ref 6.5–8.1)

## 2024-07-25 LAB — CBC WITH DIFFERENTIAL (CANCER CENTER ONLY)
Abs Immature Granulocytes: 0.02 K/uL (ref 0.00–0.07)
Basophils Absolute: 0 K/uL (ref 0.0–0.1)
Basophils Relative: 1 %
Eosinophils Absolute: 0 K/uL (ref 0.0–0.5)
Eosinophils Relative: 0 %
HCT: 24.5 % — ABNORMAL LOW (ref 39.0–52.0)
Hemoglobin: 7.8 g/dL — ABNORMAL LOW (ref 13.0–17.0)
Immature Granulocytes: 1 %
Lymphocytes Relative: 11 %
Lymphs Abs: 0.3 K/uL — ABNORMAL LOW (ref 0.7–4.0)
MCH: 31.1 pg (ref 26.0–34.0)
MCHC: 31.8 g/dL (ref 30.0–36.0)
MCV: 97.6 fL (ref 80.0–100.0)
Monocytes Absolute: 0.5 K/uL (ref 0.1–1.0)
Monocytes Relative: 16 %
Neutro Abs: 2.2 K/uL (ref 1.7–7.7)
Neutrophils Relative %: 71 %
Platelet Count: 42 K/uL — ABNORMAL LOW (ref 150–400)
RBC: 2.51 MIL/uL — ABNORMAL LOW (ref 4.22–5.81)
RDW: 16.4 % — ABNORMAL HIGH (ref 11.5–15.5)
WBC Count: 3 K/uL — ABNORMAL LOW (ref 4.0–10.5)
nRBC: 0.7 % — ABNORMAL HIGH (ref 0.0–0.2)

## 2024-07-25 LAB — SAMPLE TO BLOOD BANK

## 2024-07-25 LAB — PREPARE RBC (CROSSMATCH)

## 2024-07-25 NOTE — Progress Notes (Unsigned)
Blood transfusion ordered.

## 2024-07-25 NOTE — Telephone Encounter (Signed)
 Hgb 7.8 and plts = 42K.Per Dr. Sherrod a schedule message was sent for 1 unit of blood this week.

## 2024-07-26 ENCOUNTER — Ambulatory Visit: Payer: PRIVATE HEALTH INSURANCE

## 2024-07-27 ENCOUNTER — Ambulatory Visit

## 2024-07-27 ENCOUNTER — Inpatient Hospital Stay

## 2024-07-27 ENCOUNTER — Ambulatory Visit: Payer: Medicare Other | Admitting: Primary Care

## 2024-07-27 ENCOUNTER — Ambulatory Visit: Payer: PRIVATE HEALTH INSURANCE

## 2024-07-27 DIAGNOSIS — G893 Neoplasm related pain (acute) (chronic): Secondary | ICD-10-CM | POA: Diagnosis not present

## 2024-07-27 DIAGNOSIS — Z515 Encounter for palliative care: Secondary | ICD-10-CM | POA: Diagnosis not present

## 2024-07-27 DIAGNOSIS — C7951 Secondary malignant neoplasm of bone: Secondary | ICD-10-CM | POA: Diagnosis not present

## 2024-07-27 DIAGNOSIS — Z5111 Encounter for antineoplastic chemotherapy: Secondary | ICD-10-CM | POA: Diagnosis not present

## 2024-07-27 DIAGNOSIS — D649 Anemia, unspecified: Secondary | ICD-10-CM

## 2024-07-27 DIAGNOSIS — C3411 Malignant neoplasm of upper lobe, right bronchus or lung: Secondary | ICD-10-CM | POA: Diagnosis not present

## 2024-07-27 DIAGNOSIS — C787 Secondary malignant neoplasm of liver and intrahepatic bile duct: Secondary | ICD-10-CM | POA: Diagnosis not present

## 2024-07-27 MED ORDER — DIPHENHYDRAMINE HCL 25 MG PO CAPS
25.0000 mg | ORAL_CAPSULE | Freq: Once | ORAL | Status: AC
  Administered 2024-07-27 (×2): 25 mg via ORAL
  Filled 2024-07-27: qty 1

## 2024-07-27 MED ORDER — ACETAMINOPHEN 325 MG PO TABS
650.0000 mg | ORAL_TABLET | Freq: Once | ORAL | Status: AC
  Administered 2024-07-27 (×2): 650 mg via ORAL
  Filled 2024-07-27: qty 2

## 2024-07-27 MED ORDER — SODIUM CHLORIDE 0.9% IV SOLUTION
250.0000 mL | INTRAVENOUS | Status: DC
  Administered 2024-07-27 (×2): 100 mL via INTRAVENOUS

## 2024-07-27 NOTE — Patient Instructions (Signed)
 Blood Transfusion, Adult A blood transfusion is a procedure in which you receive blood or a type of blood cell (blood component) through an IV. You may need a blood transfusion when you have a low blood count, which is a low number of any blood cell. This may result from a bleeding disorder, illness, injury, or surgery. The blood may come from a donor, or you may be able to have your own blood collected and stored (autologous blood donation) before a planned surgery. The blood given in a transfusion may be made up of different blood components. You may receive: Red blood cells. These carry oxygen to the cells in the body. Platelets. These help your blood to clot. Plasma. This is the liquid part of your blood. It carries proteins and other substances throughout the body. White blood cells. These help you fight infections. If you have hemophilia or another clotting disorder, you may also receive other types of blood products. Depending on the type of blood product, this procedure may take 1-4 hours to complete. Tell a health care provider about: Any bleeding problems you have. Any previous reactions you have had during a blood transfusion. Any allergies you have. All medicines you are taking, including vitamins, herbs, eye drops, creams, and over-the-counter medicines. Any surgeries you have had. Any medical conditions you have. Whether you are pregnant or may be pregnant. What are the risks? Talk with your health care provider about risks. The most common problems include: A mild allergic reaction, such as red, swollen areas of skin (hives) and itching. Fever or chills. This may be the body's response to new blood cells received. This may occur during or up to 4 hours after the transfusion. More serious problems may include: A serious allergic reaction that causes difficulty breathing or swelling around the face and lips. Transfusion-associated circulatory overload (TACO), or too much fluid in  the lungs. This may cause breathing problems. Transfusion-related acute lung injury (TRALI), which causes breathing difficulty and low oxygen in the blood. This can occur within hours of the transfusion or several days later. Iron overload. This can happen after receiving many blood transfusions over a period of time. Infection or virus being transmitted. This is rare because donated blood is carefully tested before it is given. Hemolytic transfusion reaction. This is rare. It happens when the body's defense system (immune system)tries to attack the new blood cells. Symptoms may include fever, chills, nausea, low blood pressure, and low back or chest pain. Transfusion-associated graft-versus-host disease (TAGVHD). This is rare. It happens when donated cells attack the body's healthy tissues. What happens before the procedure? You will have a blood test to check your blood type. This test is done to know what kind of blood your body will accept and to match it to the donor blood. If you are going to have a planned surgery, you may be able to do an autologous blood donation. This may be done in case you need to have a transfusion. You will have your temperature, blood pressure, and pulse checked before the transfusion. If you have had an allergic reaction to a transfusion in the past, you may be given medicine to help prevent a reaction. This medicine may be given to you by mouth (orally) or through an IV. What happens during the procedure?  An IV will be inserted into one of your veins. The bag of blood will be attached to your IV. The blood will then enter through your vein. Your temperature, blood pressure,  and pulse will be monitored during the transfusion. This monitoring is done to detect early signs of a transfusion reaction. Tell your nurse right away if you have any of these symptoms during the transfusion: Shortness of breath or trouble breathing. Chest or back pain. Fever or  chills. Itching or hives. If you have any signs or symptoms of a reaction, your transfusion will be stopped and you may be given medicine. When the transfusion is complete, your IV will be removed. Pressure may be applied to the IV site for a few minutes. A bandage (dressing)will be applied. The procedure may vary among health care providers and hospitals. What happens after the procedure? Your temperature, blood pressure, pulse, breathing rate, and blood oxygen level will be monitored until you leave the hospital or clinic. Your blood may be tested to see how you have responded to the transfusion. You may be warmed with fluids or blankets to maintain a normal body temperature. If you receive your blood transfusion in an outpatient setting, you will be told whom to contact to report any reactions. Where to find more information Visit the [employer]: redcross.org Summary A blood transfusion is a procedure in which you receive blood or a type of blood cell (blood component) through an IV. The blood given in a transfusion may be made up of different blood components. You may receive red blood cells, platelets, plasma, or white blood cells depending on the condition treated. Your temperature, blood pressure, and pulse will be monitored before, during, and after the transfusion. After the transfusion, your blood may be tested to see how your body has responded. This information is not intended to replace advice given to you by your health care provider. Make sure you discuss any questions you have with your health care provider. Document Revised: 02/28/2022 Document Reviewed: 02/28/2022 [employer] Patient Education  2024 [employer].

## 2024-07-28 ENCOUNTER — Ambulatory Visit: Payer: PRIVATE HEALTH INSURANCE

## 2024-07-28 ENCOUNTER — Encounter: Payer: Self-pay | Admitting: Primary Care

## 2024-07-28 ENCOUNTER — Ambulatory Visit (INDEPENDENT_AMBULATORY_CARE_PROVIDER_SITE_OTHER): Admitting: Primary Care

## 2024-07-28 VITALS — BP 96/58 | HR 88 | Temp 97.8°F | Ht 69.0 in | Wt 245.0 lb

## 2024-07-28 DIAGNOSIS — E1169 Type 2 diabetes mellitus with other specified complication: Secondary | ICD-10-CM

## 2024-07-28 DIAGNOSIS — D649 Anemia, unspecified: Secondary | ICD-10-CM

## 2024-07-28 DIAGNOSIS — M545 Low back pain, unspecified: Secondary | ICD-10-CM

## 2024-07-28 DIAGNOSIS — Z794 Long term (current) use of insulin: Secondary | ICD-10-CM

## 2024-07-28 DIAGNOSIS — R931 Abnormal findings on diagnostic imaging of heart and coronary circulation: Secondary | ICD-10-CM | POA: Diagnosis not present

## 2024-07-28 DIAGNOSIS — G4733 Obstructive sleep apnea (adult) (pediatric): Secondary | ICD-10-CM

## 2024-07-28 DIAGNOSIS — C3411 Malignant neoplasm of upper lobe, right bronchus or lung: Secondary | ICD-10-CM | POA: Diagnosis not present

## 2024-07-28 DIAGNOSIS — J9611 Chronic respiratory failure with hypoxia: Secondary | ICD-10-CM | POA: Diagnosis not present

## 2024-07-28 DIAGNOSIS — K219 Gastro-esophageal reflux disease without esophagitis: Secondary | ICD-10-CM

## 2024-07-28 DIAGNOSIS — I10 Essential (primary) hypertension: Secondary | ICD-10-CM | POA: Diagnosis not present

## 2024-07-28 DIAGNOSIS — E785 Hyperlipidemia, unspecified: Secondary | ICD-10-CM | POA: Diagnosis not present

## 2024-07-28 DIAGNOSIS — E230 Hypopituitarism: Secondary | ICD-10-CM | POA: Diagnosis not present

## 2024-07-28 DIAGNOSIS — Z86711 Personal history of pulmonary embolism: Secondary | ICD-10-CM

## 2024-07-28 DIAGNOSIS — E1165 Type 2 diabetes mellitus with hyperglycemia: Secondary | ICD-10-CM

## 2024-07-28 LAB — BPAM RBC
Blood Product Expiration Date: 202508222359
ISSUE DATE / TIME: 202508131417
Unit Type and Rh: 1700

## 2024-07-28 LAB — TYPE AND SCREEN
ABO/RH(D): B POS
Antibody Screen: NEGATIVE
Unit division: 0

## 2024-07-28 LAB — LIPID PANEL
Cholesterol: 99 mg/dL (ref 0–200)
HDL: 46 mg/dL (ref >39.00)
LDL Cholesterol: 34 mg/dL (ref 0–99)
NonHDL: 52.81
Total CHOL/HDL Ratio: 2
Triglycerides: 93 mg/dL (ref 0.0–149.0)
VLDL: 18.6 mg/dL (ref 0.0–40.0)

## 2024-07-28 LAB — HEMOGLOBIN A1C: Hgb A1c MFr Bld: 6.7 % — ABNORMAL HIGH (ref 4.6–6.5)

## 2024-07-28 NOTE — Assessment & Plan Note (Addendum)
 Reviewed results with patient and his wife today, they have not followed up with cardiology in years.   Due to hypotension we will be discontinuing the amlodipine .  Continue losartan  50 mg daily. Continue apixaban  5 mg twice daily  Consider discontinuation of pioglitazone  as mentioned below. Await A1c result.  ECG today with sinus tachycardia with rate of 114.  Will consult with cardiology regarding results.  Compared to ECG from May 2025 and overall looks similar except for a pause noted.

## 2024-07-28 NOTE — Assessment & Plan Note (Signed)
 Following with medical oncology, office notes reviewed from July 2025. Labs also reviewed from July 2025 and August 2025.  Continue infusions as scheduled/directed.  Reviewed CT chest/abdomen/pelvis from June 2025.

## 2024-07-28 NOTE — Assessment & Plan Note (Signed)
 Reviewed labs from July and August 2025 per oncology. Repeat CBC next week as scheduled.

## 2024-07-28 NOTE — Progress Notes (Addendum)
 Subjective:    Patient ID: Adam Wise, male    DOB: March 06, 1951, 73 y.o.   MRN: [id number]  HPI  Adam Wise is a very pleasant 73 y.o. male with a significant medical history including hypertension, OSA, CAD, type 2 diabetes, hyperlipidemia, hypogonadism, lung cancer, pleural effusion who presents today for follow-up of chronic conditions.  1) Lung Cancer: Following with medical oncology, last office visit was 07/11/2024.  Currently managed on Topotecan  1.2 mg/M2 on days 1-5 every 3 weeks with Cosela  240 mg/M2 IV prior to chemotherapy.  As of January 2025 he was status post 7 cycles.  He is currently being monitored for anemia.  Hemoglobin on 07/25/2024 was 7.8 so he underwent 1 unit of PRBC that day.  He feels fatigued most of the time. Is sleeping more frequently.   2) Hyperlipidemia/CAD/Hypertension/History of PE: Currently managed on amlodipine  5 mg daily, losartan  50 mg daily, pravastatin  40 mg daily, apixaban  5 mg twice daily.  He denies chest pain, dizziness, headaches.    BP Readings from Last 3 Encounters:  09/26/24 112/72  09/23/24 128/81  09/22/24 108/76     3) Type 2 Diabetes:  Current medications include: Metformin  1000 mg twice daily, Actos  45 mg daily. He is also managed on Novolog  during chemotherapy/infusion days due to injections of steroids.   He is checking his blood glucose 0 times daily.  Last A1C: 6.2 in February 2025 Last Eye Exam: Due Last Foot Exam: Up-to-date Pneumonia Vaccination: Up-to-date Urine Microalbumin: Due Statin: Pravastatin   Dietary changes since last visit: Decreased appetite. Mostly grazing during the day, sometimes will eat eggs. He plans to start premier protein drinks.   4) Chronic Back Pain/Chronic Pain: Currently managed on cyclobenzaprine  5 mg as needed, gabapentin  300 mg twice daily, MS Contin  30 mg every 8 hours as needed, Percocet every 4 hours as needed.  This regimen is mostly prescribed by the cancer center.  He is not  taking the cyclobenzaprine  as it is ineffective.      Review of Systems  Constitutional:  Positive for fatigue.  Respiratory:  Positive for shortness of breath.   Cardiovascular:  Negative for chest pain.  Gastrointestinal:  Negative for constipation and diarrhea.  Musculoskeletal:  Positive for back pain.  Neurological:  Negative for dizziness and headaches.         Past Medical History:  Diagnosis Date   Acute on chronic respiratory failure with hypoxia (HCC) 10/12/2021   Anxiety    Arthritis    Chickenpox    Chronic knee pain    Chronic low back pain    COPD exacerbation (HCC) 10/11/2021   COPD with exacerbation (HCC) 10/12/2021   Coronary artery calcification seen on CAT scan 11/2021   Coronary CTA 11/27/2021: Moderate to large right pleural effusion and compressive atelectasis right lung base. => Coronary Calcium Score 657.  Diffuse RCA calcification.  Minimal mild disease in the LAD and diagonal branches. == Overall limited study.  Notable artifact.   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 He has disease progression after the first line of chemotherapy in December 2021.   Testosterone  deficiency    Type 2 diabetes mellitus (HCC)     Social History   Socioeconomic History   Marital status: Married    Spouse name: Not on file   Number of children: Not on file   Years of education: Not on file   Highest education level: 12th grade  Occupational History   Not on file   Tobacco Use   Smoking status: Former    Passive exposure: Never   Smokeless tobacco: Never  Vaping Use   Vaping status: Never Used  Substance and Sexual Activity   Alcohol use: Not Currently    Comment: rarely   Drug use: Not Currently   Sexual activity: Yes  Other Topics Concern   Not on file  Social History Narrative   Married.   Moved from [place].   Retired.   Social Drivers of Corporate investment banker Strain: Low Risk  (08/23/2024)   Received from [facility]   Overall Financial Resource Strain (CARDIA)    How hard is it for you to pay for the very basics like food, housing, medical care, and heating?: Not hard at all  Food Insecurity: No Food Insecurity (08/23/2024)   Received from [facility]   Hunger Vital Sign    Within the past 12 months, you worried that your food would run out before you got the money to buy more.: Never true    Within the past 12 months, the food you bought just didn't last and you didn't have money to get more.: Never true  Transportation Needs: No Transportation Needs (08/23/2024)   Received from [facility] - Transportation    In the past 12 months, has lack of transportation kept you from medical appointments or from getting medications?: No    In the past 12 months, has lack of transportation kept you from meetings, work, or from getting things needed for daily living?: No  Physical Activity: Inactive (08/23/2024)   Received from [facility]   Exercise Vital Sign    On average, how many days per week do you engage in moderate to strenuous exercise (like a brisk walk)?: 0 days    Minutes of Exercise per Session: Not on file  Stress: No Stress Concern Present (08/23/2024)   Received from [facility] - Occupational Stress Questionnaire    Do you feel stress - tense, restless, nervous, or anxious, or unable to sleep at night because your mind is troubled all the time - these days?: Not at all   Social Connections: Socially Integrated (08/23/2024)   Received from [facility]   Social Network    How would you rate your social network (family, work, friends)?: Good participation with social networks  Intimate Partner Violence: Not At Risk (08/23/2024)   Received from [facility]   HITS    Over the last 12 months how often did your partner physically hurt you?: Never    Over the last 12 months how often did your partner insult you or talk down to you?: Never    Over the last 12 months how often did your partner threaten you with physical harm?: Never    Over the last 12 months how often did your partner scream or curse at you?: Never    Past Surgical History:  Procedure Laterality Date   CHEST TUBE INSERTION  Right 01/20/2022   Procedure: CHEST TUBE INSERTION;  Surgeon: Claudene Toribio BROCKS, MD;  Location: [facility] ENDOSCOPY;  Service: Pulmonary;  Laterality: Right;  w/ Talc  Pleurodesis, planned admit for Obs afterwards   CHEST TUBE INSERTION Right 04/28/2022   Procedure: INSERTION PLEURAL DRAINAGE CATHETER - Pigtail tail, drainage;  Surgeon: Claudene Toribio BROCKS, MD;  Location: [facility] ENDOSCOPY;  Service: Pulmonary;  Laterality: Right;  talc  pleurodesis   CHEST TUBE INSERTION Right 09/01/2022   Procedure: INSERTION PLEURAL DRAINAGE CATHETER;  Surgeon: Alaine Vicenta NOVAK, MD;  Location: [facility] ENDOSCOPY;  Service: Cardiopulmonary;  Laterality: Right;  pigtail catheter placement with talc  pleurodesis   COLONOSCOPY WITH PROPOFOL  N/A 12/17/2018   Procedure: COLONOSCOPY WITH PROPOFOL ;  Surgeon: Janalyn Keene NOVAK, MD;  Location: [facility] ENDOSCOPY;  Service: Gastroenterology;  Laterality: N/A;   IR IMAGING GUIDED PORT INSERTION  04/17/2020   IR IVC FILTER RETRIEVAL / S&I /IMG GUID/MOD SED  09/19/2024   IR IVC FILTER RETRIEVAL / S&I PORTER GUID/MOD SED  09/19/2024   IR THORACENTESIS ASP PLEURAL SPACE W/IMG GUIDE  11/29/2021   IR THORACENTESIS ASP PLEURAL SPACE W/IMG GUIDE  01/03/2022   JOINT REPLACEMENT Bilateral     RADIOLOGY WITH ANESTHESIA N/A 09/19/2024   Procedure: RADIOLOGY WITH ANESTHESIA;  Surgeon: Radiologist, Medication, MD;  Location: [facility] ORS;  Service: Radiology;  Laterality: N/A;   REPLACEMENT TOTAL KNEE BILATERAL  2015   TALC  PLEURODESIS  09/01/2022   Procedure: TALC  PLEURADESIS;  Surgeon: Alaine Vicenta NOVAK, MD;  Location: [facility] ENDOSCOPY;  Service: Cardiopulmonary;;   TONSILLECTOMY  1960   TRANSTHORACIC ECHOCARDIOGRAM  05/2020   a) 05/2020: EF 55 to 60%.  No R WMA.  Mild LVH.  Indeterminate LVEDP.  Unable to assess RVP.  Normal aortic and mitral valves.  Mildly elevated RAP.; b) 09/2021: EF 50-55%. No RWMA. Mild LVH. ~ LVEDP. Mild LA Dilation. NORMAL RV/RAP.  Normal MV/AoV.    Family History  Problem Relation Age of Onset   Heart attack Mother    Cancer Mother    Hypertension Mother    Arthritis Father    Asthma Father    Cancer Father    COPD Father    Heart attack Father    Hypertension Sister    Cancer Sister    Diabetes Sister    Asthma Sister    Cancer Sister    COPD Sister    Arthritis Sister    Asthma Sister    Diabetes Sister    Birth defects Maternal Grandfather    Arthritis Paternal Grandmother    Diabetes Paternal Grandmother    Arthritis Paternal Grandfather    Asthma Son    Other Neg Hx        pituitary disorder    Allergies  Allergen Reactions   Bupropion Other (See Comments) and Dermatitis    Racing heart  palpitations  Racing heart  palpitations Racing heart palpitations Racing heart    palpitations    Racing heart    palpitations Racing heart palpitations Racing heart    palpitations, Racing heart, palpitations, Racing heart   Hydrochlorothiazide Other (See Comments)    Cramping to lower extremities   Lisinopril      Cough     Current Outpatient Medications on File Prior to Visit  Medication Sig Dispense Refill   albuterol  (VENTOLIN  HFA) 108 (90 Base) MCG/ACT inhaler Inhale 2 puffs into the lungs every 4 (four) hours as needed for shortness of  breath. 18 each 0   B-D 3CC LUER-LOK SYR 22GX1 22G X  1 3 ML MISC USE AS INSTRUCTED FOR TESTOSTERONE  INJECTION EVERY 2 WEEKS 10 each 2   Black Pepper-Turmeric (TURMERIC COMPLEX/BLACK PEPPER PO) Take 1 tablet by mouth in the morning and at bedtime.     Coenzyme Q10 (COQ10) 200 MG CAPS Take 200 mg by mouth daily.     docusate sodium  (COLACE) 100 MG capsule Take 1 capsule (100 mg total) by mouth every 12 (twelve) hours. (Patient taking differently: Take 100 mg by mouth daily as needed for moderate constipation.) 60 capsule 0   ELIQUIS  5 MG TABS tablet TAKE 1 TABLET BY MOUTH TWICE A DAY 180 tablet 2   esomeprazole (NEXIUM) 20 MG capsule Take 20 mg by mouth in the morning and at bedtime.     glucose blood (ONETOUCH ULTRA) test strip USE UP TO 4 TIMES DAILY AS DIRECTED 400 strip 5   Insulin  Pen Needle (BD PEN NEEDLE NANO U/F) 32G X 4 MM MISC Use with insulin  as prescribed Dx Code: E11.9 100 each 3   KRILL OIL PO Take 1 capsule by mouth daily.     Lancets MISC USE UP TO 3 TIMES DAILY AS DIRECTED 100 each 2   metFORMIN  (GLUCOPHAGE ) 1000 MG tablet TAKE 1 TABLET (1,000 MG TOTAL) BY MOUTH 2 (TWO) TIMES DAILY WITH A MEAL. FOR DIABETES 180 tablet 0   mirtazapine  (REMERON ) 15 MG tablet TAKE 1 TABLET BY MOUTH EVERYDAY AT BEDTIME 90 tablet 1   Multiple Vitamin (MULTI-VITAMINS) TABS Take 1 tablet by mouth daily with breakfast.     Naloxone  HCl 3 MG/0.1ML LIQD Place 1 spray into both nostrils once. For known/suspected opiod overdose. Every 2-3 minutes in alternating nostril till EMS arrives.     ondansetron  (ZOFRAN ) 8 MG tablet Take 1 tablet (8 mg total) by mouth every 8 (eight) hours as needed for nausea or vomiting. 30 tablet 3   polyethylene glycol powder (GLYCOLAX /MIRALAX ) 17 GM/SCOOP powder Take 17 g by mouth daily as needed for moderate constipation or mild constipation.     PRESCRIPTION MEDICATION CPAP- At bedtime     testosterone  cypionate (DEPOTESTOSTERONE CYPIONATE) 200 MG/ML injection Inject 200 mg into  the muscle every 14 (fourteen) days.     Current Facility-Administered Medications on File Prior to Visit  Medication Dose Route Frequency Provider Last Rate Last Admin   heparin  lock flush 100 unit/mL  500 Units Intracatheter Once Sherrod Sherrod, MD       sodium chloride  flush (NS) 0.9 % injection 10 mL  10 mL Intracatheter Once Sherrod Sherrod, MD        BP (!) 96/58   Pulse 88   Temp 97.8 F (36.6 C) (Temporal)   Ht 5' 9 (1.753 m)   Wt 245 lb (111.1 kg)   SpO2 96%   BMI 36.18 kg/m  Objective:   Physical Exam Cardiovascular:     Rate and Rhythm: Normal rate. Rhythm irregular.  Pulmonary:     Effort: Pulmonary effort is normal.     Breath sounds: Normal breath sounds.  Musculoskeletal:     Cervical back: Neck supple.  Skin:    General: Skin is warm and dry.     Coloration: Skin is pale.  Neurological:     Mental Status: He is alert and oriented to person, place, and time.  Psychiatric:        Mood and Affect: Mood normal.           Assessment & Plan:  Type 2 diabetes mellitus with hyperglycemia, with long-term current  use of insulin  (HCC) Assessment & Plan: Repeat A1c pending.  Continue metformin  1000 mg twice daily. Consider discontinuation of pioglitazone  40 mg daily if applicable.  Could switch to Jardiance or Farxiga. Continue Novolog  PRN during chemotherapy/infusion days for elevated glucose levels. Will prescribe CGM given his frailty while on insulin .  Urine microalbumin due and pending.  Follow-up in 6 months.  Orders: -     Hemoglobin A1c  Hyperlipidemia associated with type 2 diabetes mellitus (HCC) Assessment & Plan: Repeat lipid panel pending.  Unclear why he is not on high intensity statin based on coronary calcium score. Continue pravastatin  40 mg daily for now.  Await results.  Orders: -     Lipid panel  Essential hypertension Assessment & Plan: Consistently low.  Given anemia, coupled with fatigue and low readings, discontinue  amlodipine . Continue losartan  50 mg daily for renal protection.    Orders: -     EKG 12-Lead  Coronary Calcium Score 657 Assessment & Plan: Reviewed results with patient and his wife today, they have not followed up with cardiology in years.   Due to hypotension we will be discontinuing the amlodipine .  Continue losartan  50 mg daily. Continue apixaban  5 mg twice daily  Consider discontinuation of pioglitazone  as mentioned below. Await A1c result.  ECG today with sinus tachycardia with rate of 114.  Will consult with cardiology regarding results.  Compared to ECG from May 2025 and overall looks similar except for a pause noted.   Chronic respiratory failure with hypoxia (HCC) Assessment & Plan: Stable.  Following with pulmonology and oncology.   OSA (obstructive sleep apnea) Assessment & Plan: Continue CPAP machine nightly.   Small cell lung cancer, right upper lobe [facility]) Assessment & Plan: Following with medical oncology, office notes reviewed from July 2025. Labs also reviewed from July 2025 and August 2025.  Continue infusions as scheduled/directed.  Reviewed CT chest/abdomen/pelvis from June 2025.   Gastroesophageal reflux disease, unspecified whether esophagitis present Assessment & Plan: No concerns today.  Continue Nexium 20 mg daily.   Hypogonadotropic hypogonadism Assessment & Plan: Following with endocrinology, office notes reviewed fromApril 2025 through Care Everywhere.   Chronic bilateral low back pain without sciatica Assessment & Plan: Ongoing.  Waxes and wanes.  Following with oncology. Continue MS Contin  30 mg twice daily, Percocet every 4 hours as needed, gabapentin  3 oh milligrams twice daily.   History of pulmonary embolism Assessment & Plan: Continue apixaban  5 mg twice daily   Anemia, unspecified type Assessment & Plan: Reviewed labs from July and August 2025 per oncology. Repeat CBC next week as  scheduled.          Giana Castner K Cary Lothrop, NP

## 2024-07-28 NOTE — Assessment & Plan Note (Signed)
 Continue apixaban  5 mg twice daily

## 2024-07-28 NOTE — Assessment & Plan Note (Signed)
 Following with endocrinology, office notes reviewed fromApril 2025 through Care Everywhere.

## 2024-07-28 NOTE — Assessment & Plan Note (Signed)
 Repeat lipid panel pending.  Unclear why he is not on high intensity statin based on coronary calcium score. Continue pravastatin  40 mg daily for now.  Await results.

## 2024-07-28 NOTE — Assessment & Plan Note (Signed)
 -  Continue CPAP machine nightly.

## 2024-07-28 NOTE — Assessment & Plan Note (Signed)
 Ongoing.  Waxes and wanes.  Following with oncology. Continue MS Contin  30 mg twice daily, Percocet every 4 hours as needed, gabapentin  3 oh milligrams twice daily.

## 2024-07-28 NOTE — Assessment & Plan Note (Signed)
 Stable.  Following with pulmonology and oncology.

## 2024-07-28 NOTE — Assessment & Plan Note (Signed)
 Consistently low.  Given anemia, coupled with fatigue and low readings, discontinue amlodipine . Continue losartan  50 mg daily for renal protection.

## 2024-07-28 NOTE — Assessment & Plan Note (Addendum)
 Repeat A1c pending.  Continue metformin  1000 mg twice daily. Consider discontinuation of pioglitazone  40 mg daily if applicable.  Could switch to Jardiance or Farxiga. Continue Novolog  PRN during chemotherapy/infusion days for elevated glucose levels. Will prescribe CGM given his frailty while on insulin .  Urine microalbumin due and pending.  Follow-up in 6 months.

## 2024-07-28 NOTE — Patient Instructions (Addendum)
 Stop by the lab prior to leaving today. I will notify you of your results once received.   Stop taking amlodipine  5 mg daily for blood pressure.  Please schedule a follow up visit for 6 months for a diabetes check.  It was a pleasure to see you today!

## 2024-07-28 NOTE — Assessment & Plan Note (Signed)
 No concerns today.  Continue Nexium 20 mg daily.

## 2024-07-29 ENCOUNTER — Encounter (HOSPITAL_COMMUNITY): Payer: Self-pay

## 2024-07-29 ENCOUNTER — Emergency Department (HOSPITAL_COMMUNITY)
Admission: EM | Admit: 2024-07-29 | Discharge: 2024-07-29 | Disposition: A | Discharge status: Home / Self Care | Attending: Emergency Medicine | Admitting: Emergency Medicine

## 2024-07-29 ENCOUNTER — Ambulatory Visit: Payer: PRIVATE HEALTH INSURANCE

## 2024-07-29 ENCOUNTER — Ambulatory Visit: Payer: Self-pay | Admitting: Primary Care

## 2024-07-29 DIAGNOSIS — Z7901 Long term (current) use of anticoagulants: Secondary | ICD-10-CM | POA: Insufficient documentation

## 2024-07-29 DIAGNOSIS — J449 Chronic obstructive pulmonary disease, unspecified: Secondary | ICD-10-CM | POA: Diagnosis present

## 2024-07-29 DIAGNOSIS — M549 Dorsalgia, unspecified: Secondary | ICD-10-CM | POA: Diagnosis not present

## 2024-07-29 DIAGNOSIS — C3490 Malignant neoplasm of unspecified part of unspecified bronchus or lung: Secondary | ICD-10-CM | POA: Diagnosis not present

## 2024-07-29 DIAGNOSIS — C3411 Malignant neoplasm of upper lobe, right bronchus or lung: Secondary | ICD-10-CM

## 2024-07-29 DIAGNOSIS — C7951 Secondary malignant neoplasm of bone: Secondary | ICD-10-CM | POA: Diagnosis present

## 2024-07-29 DIAGNOSIS — Z794 Long term (current) use of insulin: Secondary | ICD-10-CM | POA: Diagnosis not present

## 2024-07-29 DIAGNOSIS — I2510 Atherosclerotic heart disease of native coronary artery without angina pectoris: Secondary | ICD-10-CM | POA: Diagnosis present

## 2024-07-29 DIAGNOSIS — Z86711 Personal history of pulmonary embolism: Secondary | ICD-10-CM | POA: Diagnosis not present

## 2024-07-29 DIAGNOSIS — Z833 Family history of diabetes mellitus: Secondary | ICD-10-CM | POA: Diagnosis not present

## 2024-07-29 DIAGNOSIS — J984 Other disorders of lung: Secondary | ICD-10-CM | POA: Diagnosis not present

## 2024-07-29 DIAGNOSIS — D630 Anemia in neoplastic disease: Secondary | ICD-10-CM | POA: Diagnosis present

## 2024-07-29 DIAGNOSIS — Z96653 Presence of artificial knee joint, bilateral: Secondary | ICD-10-CM | POA: Diagnosis present

## 2024-07-29 DIAGNOSIS — E1169 Type 2 diabetes mellitus with other specified complication: Secondary | ICD-10-CM | POA: Diagnosis present

## 2024-07-29 DIAGNOSIS — R079 Chest pain, unspecified: Secondary | ICD-10-CM | POA: Diagnosis present

## 2024-07-29 DIAGNOSIS — Z8249 Family history of ischemic heart disease and other diseases of the circulatory system: Secondary | ICD-10-CM | POA: Diagnosis not present

## 2024-07-29 DIAGNOSIS — Z515 Encounter for palliative care: Secondary | ICD-10-CM

## 2024-07-29 DIAGNOSIS — Z825 Family history of asthma and other chronic lower respiratory diseases: Secondary | ICD-10-CM | POA: Diagnosis not present

## 2024-07-29 DIAGNOSIS — Z85118 Personal history of other malignant neoplasm of bronchus and lung: Secondary | ICD-10-CM | POA: Insufficient documentation

## 2024-07-29 DIAGNOSIS — C787 Secondary malignant neoplasm of liver and intrahepatic bile duct: Secondary | ICD-10-CM | POA: Diagnosis present

## 2024-07-29 DIAGNOSIS — G4733 Obstructive sleep apnea (adult) (pediatric): Secondary | ICD-10-CM | POA: Diagnosis present

## 2024-07-29 DIAGNOSIS — M545 Low back pain, unspecified: Secondary | ICD-10-CM | POA: Diagnosis not present

## 2024-07-29 DIAGNOSIS — G893 Neoplasm related pain (acute) (chronic): Secondary | ICD-10-CM

## 2024-07-29 DIAGNOSIS — Z7984 Long term (current) use of oral hypoglycemic drugs: Secondary | ICD-10-CM | POA: Diagnosis not present

## 2024-07-29 DIAGNOSIS — Z79899 Other long term (current) drug therapy: Secondary | ICD-10-CM | POA: Insufficient documentation

## 2024-07-29 DIAGNOSIS — G894 Chronic pain syndrome: Secondary | ICD-10-CM | POA: Diagnosis present

## 2024-07-29 DIAGNOSIS — I700 Atherosclerosis of aorta: Secondary | ICD-10-CM | POA: Diagnosis not present

## 2024-07-29 DIAGNOSIS — R918 Other nonspecific abnormal finding of lung field: Secondary | ICD-10-CM | POA: Diagnosis not present

## 2024-07-29 DIAGNOSIS — G8929 Other chronic pain: Secondary | ICD-10-CM | POA: Diagnosis not present

## 2024-07-29 DIAGNOSIS — E785 Hyperlipidemia, unspecified: Secondary | ICD-10-CM | POA: Diagnosis present

## 2024-07-29 DIAGNOSIS — D696 Thrombocytopenia, unspecified: Secondary | ICD-10-CM | POA: Diagnosis present

## 2024-07-29 DIAGNOSIS — I441 Atrioventricular block, second degree: Secondary | ICD-10-CM | POA: Diagnosis present

## 2024-07-29 DIAGNOSIS — M5459 Other low back pain: Secondary | ICD-10-CM | POA: Diagnosis not present

## 2024-07-29 DIAGNOSIS — R0789 Other chest pain: Secondary | ICD-10-CM | POA: Diagnosis not present

## 2024-07-29 DIAGNOSIS — I10 Essential (primary) hypertension: Secondary | ICD-10-CM | POA: Diagnosis present

## 2024-07-29 DIAGNOSIS — M799 Soft tissue disorder, unspecified: Secondary | ICD-10-CM | POA: Diagnosis not present

## 2024-07-29 DIAGNOSIS — M48061 Spinal stenosis, lumbar region without neurogenic claudication: Secondary | ICD-10-CM | POA: Diagnosis present

## 2024-07-29 DIAGNOSIS — I517 Cardiomegaly: Secondary | ICD-10-CM | POA: Diagnosis not present

## 2024-07-29 DIAGNOSIS — Z87891 Personal history of nicotine dependence: Secondary | ICD-10-CM | POA: Diagnosis not present

## 2024-07-29 LAB — CBC WITH DIFFERENTIAL/PLATELET
Abs Immature Granulocytes: 0.02 K/uL (ref 0.00–0.07)
Basophils Absolute: 0 K/uL (ref 0.0–0.1)
Basophils Relative: 0 %
Eosinophils Absolute: 0 K/uL (ref 0.0–0.5)
Eosinophils Relative: 0 %
HCT: 28.6 % — ABNORMAL LOW (ref 39.0–52.0)
Hemoglobin: 8.9 g/dL — ABNORMAL LOW (ref 13.0–17.0)
Immature Granulocytes: 1 %
Lymphocytes Relative: 6 %
Lymphs Abs: 0.2 K/uL — ABNORMAL LOW (ref 0.7–4.0)
MCH: 31.1 pg (ref 26.0–34.0)
MCHC: 31.1 g/dL (ref 30.0–36.0)
MCV: 100 fL (ref 80.0–100.0)
Monocytes Absolute: 0.3 K/uL (ref 0.1–1.0)
Monocytes Relative: 12 %
Neutro Abs: 2.4 K/uL (ref 1.7–7.7)
Neutrophils Relative %: 81 %
Platelets: 128 K/uL — ABNORMAL LOW (ref 150–400)
RBC: 2.86 MIL/uL — ABNORMAL LOW (ref 4.22–5.81)
RDW: 18.1 % — ABNORMAL HIGH (ref 11.5–15.5)
WBC: 3 K/uL — ABNORMAL LOW (ref 4.0–10.5)
nRBC: 0.7 % — ABNORMAL HIGH (ref 0.0–0.2)

## 2024-07-29 LAB — BASIC METABOLIC PANEL WITH GFR
Anion gap: 8 (ref 5–15)
BUN: 15 mg/dL (ref 8–23)
CO2: 27 mmol/L (ref 22–32)
Calcium: 8.4 mg/dL — ABNORMAL LOW (ref 8.9–10.3)
Chloride: 103 mmol/L (ref 98–111)
Creatinine, Ser: 0.89 mg/dL (ref 0.61–1.24)
GFR, Estimated: >60 mL/min (ref >60)
Glucose, Bld: 148 mg/dL — ABNORMAL HIGH (ref 70–99)
Potassium: 3.9 mmol/L (ref 3.5–5.1)
Sodium: 138 mmol/L (ref 135–145)

## 2024-07-29 MED ORDER — MORPHINE SULFATE ER 30 MG PO TBCR
30.0000 mg | EXTENDED_RELEASE_TABLET | Freq: Three times a day (TID) | ORAL | 0 refills | Status: DC
Start: 2024-07-29 — End: 2024-07-29

## 2024-07-29 MED ORDER — OXYCODONE-ACETAMINOPHEN 10-325 MG PO TABS: 1.0000 | ORAL_TABLET | ORAL | 0 refills | Status: AC | PRN

## 2024-07-29 MED ORDER — DEXAMETHASONE SODIUM PHOSPHATE 10 MG/ML IJ SOLN
10.0000 mg | Freq: Once | INTRAMUSCULAR | Status: AC
  Administered 2024-07-29: 10 mg via INTRAVENOUS
  Filled 2024-07-29: qty 1

## 2024-07-29 MED ORDER — OXYCODONE-ACETAMINOPHEN 10-325 MG PO TABS: 1.0000 | ORAL_TABLET | ORAL | 0 refills | Status: DC | PRN

## 2024-07-29 MED ORDER — CYCLOBENZAPRINE HCL 10 MG PO TABS: 10.0000 mg | ORAL_TABLET | Freq: Two times a day (BID) | ORAL | 0 refills | Status: DC | PRN

## 2024-07-29 MED ORDER — MORPHINE SULFATE ER 30 MG PO TBCR: 30.0000 mg | EXTENDED_RELEASE_TABLET | Freq: Three times a day (TID) | ORAL | 0 refills | Status: DC

## 2024-07-29 MED ORDER — HEPARIN SOD (PORK) LOCK FLUSH 100 UNIT/ML IV SOLN
500.0000 [IU] | Freq: Once | INTRAVENOUS | Status: AC
  Administered 2024-07-29: 500 [IU]
  Filled 2024-07-29: qty 5

## 2024-07-29 MED ORDER — MORPHINE SULFATE ER 30 MG PO TBCR: 30.0000 mg | EXTENDED_RELEASE_TABLET | Freq: Three times a day (TID) | ORAL | 0 refills | Status: AC

## 2024-07-29 MED ORDER — HYDROMORPHONE HCL 1 MG/ML IJ SOLN
1.0000 mg | Freq: Once | INTRAMUSCULAR | Status: AC
  Administered 2024-07-29: 1 mg via INTRAVENOUS
  Filled 2024-07-29: qty 1

## 2024-07-29 MED ORDER — CYCLOBENZAPRINE HCL 10 MG PO TABS
10.0000 mg | ORAL_TABLET | Freq: Once | ORAL | Status: AC
  Administered 2024-07-29: 10 mg via ORAL
  Filled 2024-07-29: qty 1

## 2024-07-29 NOTE — ED Provider Notes (Signed)
 [place] EMERGENCY DEPARTMENT AT [facility] Provider Note   CSN: [id number] Arrival date & time: 07/29/24  1418     Patient presents with: Back Pain   Adam Wise is a 73 y.o. male.   Patient here with low back pain.  He has chronic back pain that he takes morphine  and oxycodone  for.  He still states the pain specialist in the [facility] system for early seen.  Denies any loss of bowel or bladder.  Denies any weakness numbness tingling.  Denies any new trauma or falls.  He has a history of lung cancer undergoing maintenance therapy.  Does not have history of metastasis to the spine.  Feels like of bad pain flare for him.  Does not feel outside of his bad pain flares.  He is on anticoagulation.  He denies any fevers or chills.  He does have well-controlled diabetes.  The history is provided by the patient.       Prior to Admission medications   Medication Sig Start Date End Date Taking? Authorizing Provider  cyclobenzaprine  (FLEXERIL ) 10 MG tablet Take 1 tablet (10 mg total) by mouth 2 (two) times daily as needed for muscle spasms. 07/29/24  Yes Destini Cambre, DO  albuterol  (VENTOLIN  HFA) 108 (90 Base) MCG/ACT inhaler Inhale 2 puffs into the lungs every 4 (four) hours as needed for shortness of breath. 05/30/24   [name], NP  B-D 3CC LUER-LOK SYR 22GX1 22G X 1 3 ML MISC USE AS INSTRUCTED FOR TESTOSTERONE  INJECTION EVERY 2 WEEKS 03/20/21   Clark, Katherine K, NP  Black Pepper-Turmeric (TURMERIC COMPLEX/BLACK PEPPER PO) Take 1 tablet by mouth in the morning and at bedtime.    [provider]  Coenzyme Q10 (COQ10) 200 MG CAPS Take 200 mg by mouth at bedtime.    [provider]  docusate sodium  (COLACE) 100 MG capsule Take 1 capsule (100 mg total) by mouth every 12 (twelve) hours. 08/02/23   Mannie Pac T, DO  ELIQUIS  5 MG TABS tablet TAKE 1 TABLET BY MOUTH TWICE A DAY 12/06/23   Heilingoetter, Cassandra L, PA-C  esomeprazole (NEXIUM) 20 MG capsule  Take 20 mg by mouth in the morning.    [provider]  gabapentin  (NEURONTIN ) 300 MG capsule Take 1 capsule (300 mg total) by mouth 2 (two) times daily. For back pain. 04/26/24   Pickenpack-Cousar, Athena N, NP  glucose blood (ONETOUCH ULTRA) test strip USE UP TO 4 TIMES DAILY AS DIRECTED 07/29/22   Clark, Katherine K, NP  Insulin  Pen Needle (BD PEN NEEDLE NANO U/F) 32G X 4 MM MISC Use with insulin  as prescribed Dx Code: E11.9 05/20/23   [name], NP  ipratropium-albuterol  (DUONEB) 0.5-2.5 (3) MG/3ML SOLN Take 3 mLs by nebulization every 4 (four) hours while awake for 3 days, THEN 3 mLs every 4 (four) hours as needed (shortness of breath or wheezing). 01/07/24 07/28/24  Clark, Katherine K, NP  KRILL OIL PO Take 1 capsule by mouth daily.    [provider]  Lancets MISC USE UP TO 3 TIMES DAILY AS DIRECTED 05/06/18   Clark, Katherine K, NP  lidocaine  (LIDODERM ) 5 % Place 1 patch onto the skin daily. Remove & Discard patch within 12 hours or as directed by MD 05/19/23   Kommor, Lum, MD  losartan  (COZAAR ) 50 MG tablet TAKE 1 TABLET (50 MG TOTAL) BY MOUTH DAILY. FOR BLOOD PRESSURE. 08/20/23   Clark, Katherine K, NP  Melatonin 10 MG CAPS Take 10  mg by mouth at bedtime as needed (for sleep).    [provider]  metFORMIN  (GLUCOPHAGE ) 1000 MG tablet TAKE 1 TABLET (1,000 MG TOTAL) BY MOUTH 2 (TWO) TIMES DAILY WITH A MEAL. FOR DIABETES 07/15/24   [name], NP  mirtazapine  (REMERON ) 15 MG tablet TAKE 1 TABLET BY MOUTH EVERYDAY AT BEDTIME 02/28/24   Heilingoetter, Cassandra L, PA-C  morphine  (MS CONTIN ) 30 MG 12 hr tablet Take 1 tablet (30 mg total) by mouth every 8 (eight) hours for 10 doses. 07/29/24 08/02/24  Ruthe Cornet, DO  Multiple Vitamin (MULTI-VITAMINS) TABS Take 1 tablet by mouth daily with breakfast.    [provider]  Naloxone  HCl 3 MG/0.1ML LIQD Place 1 spray into both nostrils once. For known/suspected opiod overdose. Every 2-3 minutes in alternating  nostril till EMS arrives. 10/06/23   [provider]  nystatin  (MYCOSTATIN ) 100000 UNIT/ML suspension Take 5 mLs (500,000 Units total) by mouth 4 (four) times daily. 02/29/24   Heilingoetter, Cassandra L, PA-C  ondansetron  (ZOFRAN ) 8 MG tablet Take 1 tablet (8 mg total) by mouth every 8 (eight) hours as needed for nausea or vomiting. 07/11/24   Heilingoetter, Cassandra L, PA-C  oxyCODONE -acetaminophen  (PERCOCET) 10-325 MG tablet Take 1 tablet by mouth every 4 (four) hours as needed for up to 10 days. 07/29/24 08/08/24  Greogory Cornette, DO  pioglitazone  (ACTOS ) 45 MG tablet TAKE 1 TABLET (45 MG TOTAL) BY MOUTH DAILY. FOR DIABETES. 02/14/24   Clark, Katherine K, NP  polyethylene glycol powder (GLYCOLAX /MIRALAX ) 17 GM/SCOOP powder Take by mouth once.    [provider]  pravastatin  (PRAVACHOL ) 40 MG tablet TAKE 1 TABLET BY MOUTH EVERY DAY IN THE EVENING FOR CHOLESTEROL 08/21/23   Clark, Katherine K, NP  PRESCRIPTION MEDICATION CPAP- At bedtime    [provider]  testosterone  cypionate (DEPOTESTOSTERONE CYPIONATE) 200 MG/ML injection Inject 200 mg into the muscle every 14 (fourteen) days.    [provider]    Allergies: Bupropion and Hydrochlorothiazide    Review of Systems  Updated Vital Signs BP 128/75 (BP Location: Left Arm)   Pulse (!) 103   Temp 98.1 F (36.7 C) (Oral)   Resp 16   SpO2 96%   Physical Exam Vitals and nursing note reviewed.  Constitutional:      General: He is not in acute distress.    Appearance: He is well-developed. He is not ill-appearing.  HENT:     Head: Normocephalic and atraumatic.  Eyes:     Extraocular Movements: Extraocular movements intact.     Conjunctiva/sclera: Conjunctivae normal.     Pupils: Pupils are equal, round, and reactive to light.  Cardiovascular:     Rate and Rhythm: Normal rate and regular rhythm.     Pulses: Normal pulses.     Heart sounds: Normal heart sounds. No murmur heard. Pulmonary:     Effort:  Pulmonary effort is normal. No respiratory distress.     Breath sounds: Normal breath sounds.  Abdominal:     Palpations: Abdomen is soft.     Tenderness: There is no abdominal tenderness.  Musculoskeletal:        General: Tenderness present. No swelling.     Cervical back: Normal range of motion and neck supple.     Comments: Tenderness to the paraspinal lumbar muscles bilaterally, no midline spinal tenderness  Skin:    General: Skin is warm and dry.     Capillary Refill: Capillary refill takes less than 2 seconds.  Neurological:  General: No focal deficit present.     Mental Status: He is alert and oriented to person, place, and time.     Cranial Nerves: No cranial nerve deficit.     Sensory: No sensory deficit.     Motor: No weakness.     Coordination: Coordination normal.     Comments: 5+ out of 5 strength throughout, normal sensation  Psychiatric:        Mood and Affect: Mood normal.     (all labs ordered are listed, but only abnormal results are displayed) Labs Reviewed  CBC WITH DIFFERENTIAL/PLATELET - Abnormal; Notable for the following components:      Result Value   WBC 3.0 (*)    RBC 2.86 (*)    Hemoglobin 8.9 (*)    HCT 28.6 (*)    RDW 18.1 (*)    Platelets 128 (*)    nRBC 0.7 (*)    Lymphs Abs 0.2 (*)    All other components within normal limits  BASIC METABOLIC PANEL WITH GFR - Abnormal; Notable for the following components:   Glucose, Bld 148 (*)    Calcium 8.4 (*)    All other components within normal limits    EKG: None  Radiology: No results found.   Procedures   Medications Ordered in the ED  HYDROmorphone  (DILAUDID ) injection 1 mg (1 mg Intravenous Given 07/29/24 1541)  cyclobenzaprine  (FLEXERIL ) tablet 10 mg (10 mg Oral Given 07/29/24 1651)  HYDROmorphone  (DILAUDID ) injection 1 mg (1 mg Intravenous Given 07/29/24 1652)  dexamethasone  (DECADRON ) injection 10 mg (10 mg Intravenous Given 07/29/24 1653)                                     Medical Decision Making Amount and/or Complexity of Data Reviewed Labs: ordered.  Risk Prescription drug management.   Adam Wise is here back pain.  History of chronic back pain diabetes hypertension lung cancer on maintenance medication.  He has got normal vitals.  No fever.  He is well-appearing.  Is neurologically intact.  I have no concern for cauda equina.  Does not sound like he is ever had bone metastasis.  He denies any new falls or new trauma.  He is neurovascular neuromuscular intact on exam.  He supposed to see a new pain specialist/anesthesia specialist for further management as sounds like pretty shortly in the [facility] system.  He has been taking morphine  30 mg every 8 and he will have oxycodone  on for breakthrough pain.  Overall we will check basic labs give him IV Dilaudid  and Flexeril .  Overall I do not have any concern that this is an acute emergent process.  I think this is breakthrough of his chronic pain.  Will check basic labs try to give him some pain relief he is IV.  Labs overall unremarkable.  Patient given a dose of IV Decadron  and additional dose of Dilaudid  and had good improvement of his pain.  I sent in a small prescription for his chronic morphine  and oxycodone .  Looked like he had not had anything you filled in over a month.  Will add Flexeril  for breakthrough pain.  Recommend close follow-up with his primary care doctor to make sure his pain medicine gets recent then.  Has follow-up with specialist in place.  Discharge.  This chart was dictated using voice recognition software.  Despite best efforts to proofread,  errors can occur which can  change the documentation meaning.      Final diagnoses:  Acute low back pain, unspecified back pain laterality, unspecified whether sciatica present    ED Discharge Orders          Ordered    morphine  (MS CONTIN ) 30 MG 12 hr tablet  Every 8 hours        07/29/24 1715    oxyCODONE -acetaminophen  (PERCOCET) 10-325 MG  tablet  Every 4 hours PRN        07/29/24 1715    cyclobenzaprine  (FLEXERIL ) 10 MG tablet  2 times daily PRN        07/29/24 1715               Aseel Truxillo, Juliene, DO 07/29/24 1716

## 2024-07-29 NOTE — Discharge Instructions (Addendum)
 Continue your chronic pain management.  Have added a muscle relaxant called Flexeril .  Please be careful with its use as this medication is sedating as well as your narcotic pain medicines are.

## 2024-07-29 NOTE — ED Triage Notes (Signed)
 Pt BIBA for uncontrolled chronic back pain.  Pt states 10/10 pain not controlled with home pain meds.  Pt reports taking 30mg  Morphine  3 hours ago  Pt is A&O x 4

## 2024-07-31 ENCOUNTER — Inpatient Hospital Stay (HOSPITAL_COMMUNITY)
Admission: EM | Admit: 2024-07-31 | Discharge: 2024-08-02 | DRG: 552 | Disposition: A | Discharge status: Home / Self Care | Attending: Internal Medicine | Admitting: Internal Medicine

## 2024-07-31 ENCOUNTER — Emergency Department (HOSPITAL_COMMUNITY)

## 2024-07-31 ENCOUNTER — Encounter (HOSPITAL_COMMUNITY): Payer: Self-pay

## 2024-07-31 ENCOUNTER — Other Ambulatory Visit: Payer: Self-pay

## 2024-07-31 DIAGNOSIS — I10 Essential (primary) hypertension: Secondary | ICD-10-CM | POA: Diagnosis present

## 2024-07-31 DIAGNOSIS — E119 Type 2 diabetes mellitus without complications: Secondary | ICD-10-CM

## 2024-07-31 DIAGNOSIS — M549 Dorsalgia, unspecified: Secondary | ICD-10-CM | POA: Diagnosis present

## 2024-07-31 DIAGNOSIS — Z825 Family history of asthma and other chronic lower respiratory diseases: Secondary | ICD-10-CM

## 2024-07-31 DIAGNOSIS — D696 Thrombocytopenia, unspecified: Secondary | ICD-10-CM | POA: Diagnosis present

## 2024-07-31 DIAGNOSIS — Z86711 Personal history of pulmonary embolism: Secondary | ICD-10-CM

## 2024-07-31 DIAGNOSIS — M545 Low back pain, unspecified: Principal | ICD-10-CM

## 2024-07-31 DIAGNOSIS — G4733 Obstructive sleep apnea (adult) (pediatric): Secondary | ICD-10-CM | POA: Diagnosis present

## 2024-07-31 DIAGNOSIS — Z79899 Other long term (current) drug therapy: Secondary | ICD-10-CM

## 2024-07-31 DIAGNOSIS — R0789 Other chest pain: Secondary | ICD-10-CM | POA: Diagnosis not present

## 2024-07-31 DIAGNOSIS — G8929 Other chronic pain: Principal | ICD-10-CM | POA: Diagnosis present

## 2024-07-31 DIAGNOSIS — I517 Cardiomegaly: Secondary | ICD-10-CM | POA: Diagnosis not present

## 2024-07-31 DIAGNOSIS — C3490 Malignant neoplasm of unspecified part of unspecified bronchus or lung: Secondary | ICD-10-CM | POA: Diagnosis not present

## 2024-07-31 DIAGNOSIS — R079 Chest pain, unspecified: Secondary | ICD-10-CM

## 2024-07-31 DIAGNOSIS — M799 Soft tissue disorder, unspecified: Secondary | ICD-10-CM | POA: Diagnosis not present

## 2024-07-31 DIAGNOSIS — I700 Atherosclerosis of aorta: Secondary | ICD-10-CM | POA: Diagnosis not present

## 2024-07-31 DIAGNOSIS — Z96653 Presence of artificial knee joint, bilateral: Secondary | ICD-10-CM | POA: Diagnosis present

## 2024-07-31 DIAGNOSIS — Z794 Long term (current) use of insulin: Secondary | ICD-10-CM

## 2024-07-31 DIAGNOSIS — Z87891 Personal history of nicotine dependence: Secondary | ICD-10-CM

## 2024-07-31 DIAGNOSIS — M48061 Spinal stenosis, lumbar region without neurogenic claudication: Principal | ICD-10-CM | POA: Diagnosis present

## 2024-07-31 DIAGNOSIS — K219 Gastro-esophageal reflux disease without esophagitis: Secondary | ICD-10-CM | POA: Diagnosis present

## 2024-07-31 DIAGNOSIS — G894 Chronic pain syndrome: Secondary | ICD-10-CM | POA: Diagnosis present

## 2024-07-31 DIAGNOSIS — C3411 Malignant neoplasm of upper lobe, right bronchus or lung: Secondary | ICD-10-CM | POA: Diagnosis present

## 2024-07-31 DIAGNOSIS — Z7901 Long term (current) use of anticoagulants: Secondary | ICD-10-CM

## 2024-07-31 DIAGNOSIS — D630 Anemia in neoplastic disease: Secondary | ICD-10-CM | POA: Diagnosis present

## 2024-07-31 DIAGNOSIS — J449 Chronic obstructive pulmonary disease, unspecified: Secondary | ICD-10-CM | POA: Diagnosis present

## 2024-07-31 DIAGNOSIS — E785 Hyperlipidemia, unspecified: Secondary | ICD-10-CM | POA: Diagnosis present

## 2024-07-31 DIAGNOSIS — Z8249 Family history of ischemic heart disease and other diseases of the circulatory system: Secondary | ICD-10-CM

## 2024-07-31 DIAGNOSIS — Z833 Family history of diabetes mellitus: Secondary | ICD-10-CM

## 2024-07-31 DIAGNOSIS — E1169 Type 2 diabetes mellitus with other specified complication: Secondary | ICD-10-CM | POA: Diagnosis present

## 2024-07-31 DIAGNOSIS — Z7984 Long term (current) use of oral hypoglycemic drugs: Secondary | ICD-10-CM

## 2024-07-31 DIAGNOSIS — R918 Other nonspecific abnormal finding of lung field: Secondary | ICD-10-CM | POA: Diagnosis not present

## 2024-07-31 DIAGNOSIS — Z888 Allergy status to other drugs, medicaments and biological substances status: Secondary | ICD-10-CM

## 2024-07-31 DIAGNOSIS — C7951 Secondary malignant neoplasm of bone: Secondary | ICD-10-CM | POA: Diagnosis present

## 2024-07-31 DIAGNOSIS — Z8261 Family history of arthritis: Secondary | ICD-10-CM

## 2024-07-31 DIAGNOSIS — I441 Atrioventricular block, second degree: Secondary | ICD-10-CM | POA: Diagnosis present

## 2024-07-31 DIAGNOSIS — C787 Secondary malignant neoplasm of liver and intrahepatic bile duct: Secondary | ICD-10-CM | POA: Diagnosis not present

## 2024-07-31 DIAGNOSIS — M47816 Spondylosis without myelopathy or radiculopathy, lumbar region: Secondary | ICD-10-CM | POA: Diagnosis present

## 2024-07-31 DIAGNOSIS — I2510 Atherosclerotic heart disease of native coronary artery without angina pectoris: Secondary | ICD-10-CM | POA: Diagnosis present

## 2024-07-31 DIAGNOSIS — J984 Other disorders of lung: Secondary | ICD-10-CM | POA: Diagnosis not present

## 2024-07-31 LAB — BASIC METABOLIC PANEL WITH GFR
Anion gap: 9 (ref 5–15)
BUN: 17 mg/dL (ref 8–23)
CO2: 27 mmol/L (ref 22–32)
Calcium: 8.2 mg/dL — ABNORMAL LOW (ref 8.9–10.3)
Chloride: 102 mmol/L (ref 98–111)
Creatinine, Ser: 0.9 mg/dL (ref 0.61–1.24)
GFR, Estimated: >60 mL/min (ref >60)
Glucose, Bld: 136 mg/dL — ABNORMAL HIGH (ref 70–99)
Potassium: 4.4 mmol/L (ref 3.5–5.1)
Sodium: 138 mmol/L (ref 135–145)

## 2024-07-31 LAB — TROPONIN I (HIGH SENSITIVITY)
Troponin I (High Sensitivity): 11 ng/L (ref <18)
Troponin I (High Sensitivity): 11 ng/L (ref <18)

## 2024-07-31 LAB — CBC
HCT: 28.1 % — ABNORMAL LOW (ref 39.0–52.0)
Hemoglobin: 8.5 g/dL — ABNORMAL LOW (ref 13.0–17.0)
MCH: 30.9 pg (ref 26.0–34.0)
MCHC: 30.2 g/dL (ref 30.0–36.0)
MCV: 102.2 fL — ABNORMAL HIGH (ref 80.0–100.0)
Platelets: 200 K/uL (ref 150–400)
RBC: 2.75 MIL/uL — ABNORMAL LOW (ref 4.22–5.81)
RDW: 18.6 % — ABNORMAL HIGH (ref 11.5–15.5)
WBC: 4.6 K/uL (ref 4.0–10.5)
nRBC: 0 % (ref 0.0–0.2)

## 2024-07-31 LAB — URINALYSIS, ROUTINE W REFLEX MICROSCOPIC
Bilirubin Urine: NEGATIVE
Glucose, UA: NEGATIVE mg/dL
Hgb urine dipstick: NEGATIVE
Ketones, ur: NEGATIVE mg/dL
Leukocytes,Ua: NEGATIVE
Nitrite: NEGATIVE
Protein, ur: NEGATIVE mg/dL
Specific Gravity, Urine: 1.026 (ref 1.005–1.030)
pH: 5 (ref 5.0–8.0)

## 2024-07-31 MED ORDER — SENNA 8.6 MG PO TABS
1.0000 | ORAL_TABLET | Freq: Two times a day (BID) | ORAL | Status: DC
  Administered 2024-07-31 – 2024-08-02 (×4): 8.6 mg via ORAL
  Filled 2024-07-31 (×4): qty 1

## 2024-07-31 MED ORDER — HYDROMORPHONE HCL 1 MG/ML IJ SOLN
1.0000 mg | INTRAMUSCULAR | Status: DC | PRN
  Administered 2024-08-01: 1 mg via INTRAVENOUS
  Filled 2024-07-31: qty 1

## 2024-07-31 MED ORDER — OXYCODONE HCL 5 MG PO TABS
5.0000 mg | ORAL_TABLET | ORAL | Status: DC | PRN
  Administered 2024-07-31: 5 mg via ORAL
  Filled 2024-07-31: qty 1

## 2024-07-31 MED ORDER — ACETAMINOPHEN 650 MG RE SUPP: 650.0000 mg | Freq: Four times a day (QID) | RECTAL | Status: DC | PRN

## 2024-07-31 MED ORDER — IOHEXOL 350 MG/ML SOLN
100.0000 mL | Freq: Once | INTRAVENOUS | Status: AC | PRN
  Administered 2024-07-31: 100 mL via INTRAVENOUS

## 2024-07-31 MED ORDER — INSULIN ASPART 100 UNIT/ML IJ SOLN
0.0000 [IU] | Freq: Three times a day (TID) | INTRAMUSCULAR | Status: DC
  Administered 2024-08-01: 2 [IU] via SUBCUTANEOUS
  Administered 2024-08-02: 1 [IU] via SUBCUTANEOUS
  Administered 2024-08-02: 2 [IU] via SUBCUTANEOUS

## 2024-07-31 MED ORDER — DIPHENHYDRAMINE HCL 50 MG/ML IJ SOLN: 25.0000 mg | Freq: Four times a day (QID) | INTRAMUSCULAR | Status: DC | PRN

## 2024-07-31 MED ORDER — MIRTAZAPINE 15 MG PO TABS
15.0000 mg | ORAL_TABLET | Freq: Every day | ORAL | Status: DC
  Administered 2024-07-31 – 2024-08-01 (×2): 15 mg via ORAL
  Filled 2024-07-31 (×2): qty 1

## 2024-07-31 MED ORDER — ACETAMINOPHEN 325 MG PO TABS: 650.0000 mg | ORAL_TABLET | Freq: Four times a day (QID) | ORAL | Status: DC | PRN

## 2024-07-31 MED ORDER — OXYCODONE-ACETAMINOPHEN 10-325 MG PO TABS: 1.0000 | ORAL_TABLET | ORAL | Status: DC | PRN

## 2024-07-31 MED ORDER — GABAPENTIN 300 MG PO CAPS
300.0000 mg | ORAL_CAPSULE | Freq: Two times a day (BID) | ORAL | Status: DC
  Administered 2024-07-31 – 2024-08-02 (×4): 300 mg via ORAL
  Filled 2024-07-31 (×4): qty 1

## 2024-07-31 MED ORDER — ONDANSETRON HCL 4 MG/2ML IJ SOLN: 4.0000 mg | Freq: Four times a day (QID) | INTRAMUSCULAR | Status: DC | PRN

## 2024-07-31 MED ORDER — HYDROMORPHONE HCL 1 MG/ML IJ SOLN
1.0000 mg | Freq: Once | INTRAMUSCULAR | Status: AC
  Administered 2024-07-31: 1 mg via INTRAVENOUS
  Filled 2024-07-31: qty 1

## 2024-07-31 MED ORDER — SODIUM CHLORIDE 0.9% FLUSH
3.0000 mL | Freq: Two times a day (BID) | INTRAVENOUS | Status: DC
  Administered 2024-07-31 – 2024-08-02 (×4): 3 mL via INTRAVENOUS

## 2024-07-31 MED ORDER — LOSARTAN POTASSIUM 50 MG PO TABS
50.0000 mg | ORAL_TABLET | Freq: Every day | ORAL | Status: DC
  Administered 2024-08-01 – 2024-08-02 (×2): 50 mg via ORAL
  Filled 2024-07-31 (×2): qty 1

## 2024-07-31 MED ORDER — CYCLOBENZAPRINE HCL 10 MG PO TABS: 10.0000 mg | ORAL_TABLET | Freq: Two times a day (BID) | ORAL | Status: DC | PRN

## 2024-07-31 MED ORDER — ALBUTEROL SULFATE (2.5 MG/3ML) 0.083% IN NEBU: 2.5000 mg | INHALATION_SOLUTION | RESPIRATORY_TRACT | Status: DC | PRN

## 2024-07-31 MED ORDER — ONDANSETRON HCL 4 MG PO TABS: 4.0000 mg | ORAL_TABLET | Freq: Four times a day (QID) | ORAL | Status: DC | PRN

## 2024-07-31 MED ORDER — OXYCODONE-ACETAMINOPHEN 5-325 MG PO TABS: 1.0000 | ORAL_TABLET | ORAL | Status: DC | PRN

## 2024-07-31 MED ORDER — ALBUTEROL SULFATE HFA 108 (90 BASE) MCG/ACT IN AERS: 2.0000 | INHALATION_SPRAY | RESPIRATORY_TRACT | Status: DC | PRN

## 2024-07-31 MED ORDER — MORPHINE SULFATE ER 30 MG PO TBCR
30.0000 mg | EXTENDED_RELEASE_TABLET | Freq: Three times a day (TID) | ORAL | Status: DC
  Administered 2024-07-31 – 2024-08-02 (×5): 30 mg via ORAL
  Filled 2024-07-31 (×5): qty 1

## 2024-07-31 MED ORDER — PRAVASTATIN SODIUM 20 MG PO TABS
40.0000 mg | ORAL_TABLET | Freq: Every day | ORAL | Status: DC
Start: 2024-08-01 — End: 2024-08-02
  Administered 2024-08-01: 40 mg via ORAL
  Filled 2024-07-31: qty 2

## 2024-07-31 MED ORDER — POLYETHYLENE GLYCOL 3350 17 G PO PACK: 17.0000 g | PACK | Freq: Every day | ORAL | Status: DC | PRN

## 2024-07-31 MED ORDER — APIXABAN 2.5 MG PO TABS
5.0000 mg | ORAL_TABLET | Freq: Two times a day (BID) | ORAL | Status: DC
  Administered 2024-07-31 – 2024-08-02 (×4): 5 mg via ORAL
  Filled 2024-07-31 (×4): qty 2

## 2024-07-31 NOTE — ED Notes (Signed)
 Patient has 2 knives that were given to security.

## 2024-07-31 NOTE — ED Triage Notes (Addendum)
 Patient BIB [facility] from home. Uncontrolled lower back pain since this morning. Has stage 4 lung cancer. Chronic back pain that has gradually been increasing since cancer has been progressing. Patient has been trying to control pain with home oxycodone . Patient had chest pain with EMS, that resolved after nitroglycerin  under the tongue.  EMS 20G left forearm fentanyl  7mg  ketamine

## 2024-07-31 NOTE — H&P (Signed)
 History and Physical    Adam Wise FMW:[id number] DOB: 30-Mar-1951 DOA: 07/31/2024  PCP: System, Provider Not In  Patient coming from: Home  I have personally briefly reviewed patient's old medical records in [facility]  Chief Complaint: Acute on chronic back pain  HPI: Adam Wise is a 73 y.o. 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  Patient reports chronic back pain with known severe lumbar spinal stenosis.  Generally his back pain is fairly well-controlled however today had significant worsening of pain.  Pain has been throughout his back.  He has had difficulty ambulating but has not had any acute weakness in his legs.  He denies any numbness/tingling sensation.  Denies loss of bowel/bladder control.  He also reports some vague pain across his chest which feels like it radiates to his back.  Patient states that he is scheduled to see a pain specialist this Thursday but pain was so severe he had to come to the hospital today.  He also reports he is supposed to have his next chemotherapy infusion tomorrow 8/18.  ED Course  Labs/Imaging on admission: I have personally reviewed following labs and imaging studies.  Initial vitals showed BP 108/78, pulse 97, RR 16, temp 97.9 F, SpO2 97% on room air.  Labs show WBC 4.6, hemoglobin 8.5, platelets 200, sodium 138, potassium 4.4, bicarb 27, BUN 17, creatinine 0.90, serum glucose 136, troponin 11 x 2.  CTA chest/abdomen/pelvis IMPRESSION: 1. Negative for acute aortic dissection or aneurysm. 2. Post therapeutic changes in the right hilar/suprahilar lung but with interim progressive masslike consolidative process in the right perihilar region with decreased air bronchograms  compared to prior, raising concern for tumor recurrence. Progressive soft tissue density within the mediastinum concerning for tumor recurrence/metastatic disease. 3. New irregular foci of airspace disease/nodularity and ground-glass density in the right middle lobe and right lung base as described above, indeterminate for tumor versus infectious or inflammatory process. 4. Hepatic metastatic disease with slight interval increase in size of hepatic lesions 5. Increase in size of peripancreatic and porta hepatis lymph nodes consistent with disease progression. 6. Advanced degenerative changes of the lumbar spine with suspected moderate to severe canal stenosis at L3-L4 and L4-L5. These findings would be better assessed with MRI  Patient was given IV Dilaudid  1 mg x 2.  The hospitalist service was consulted for further management.  Review of Systems: All systems reviewed and are negative except as documented in history of present illness above.   Past Medical History:  Diagnosis Date   Acute on chronic respiratory failure with hypoxia (HCC) 10/12/2021   Chickenpox    Chronic knee pain    Chronic low back pain    COPD exacerbation (HCC) 10/11/2021   COPD with exacerbation (HCC) 10/12/2021   Coronary artery calcification seen on CAT scan 11/2021   Coronary CTA 11/27/2021: Moderate to large right pleural effusion and compressive atelectasis right lung base. => Coronary Calcium Score 657.  Diffuse RCA calcification.  Minimal mild disease in the LAD and diagonal branches. == Overall limited study.  Notable artifact.   Essential hypertension    GERD (gastroesophageal reflux disease)    Hyperlipidemia    Malignant neoplasm of upper lobe of right lung (HCC) 04/04/2020   OSA (obstructive sleep  apnea)    With nighttime oxygen  supplementation   Pneumothorax on right 09/01/2022   Recurrent pleural effusion 08/27/2022   T4, M3, M1 C Metastatic Small Cell Lung Cancer 03/2020   large right upper  lobe/right hilar mass with ipsilateral and contralateral mediastinal and right supraclavicular lymphadenopathy in addition to multiple liver lesios. He has disease progression after the first line of chemotherapy in December 2021.   Testosterone  deficiency    Type 2 diabetes mellitus (HCC)     Past Surgical History:  Procedure Laterality Date   CHEST TUBE INSERTION Right 01/20/2022   Procedure: CHEST TUBE INSERTION;  Surgeon: Claudene Toribio BROCKS, MD;  Location: [facility] ENDOSCOPY;  Service: Pulmonary;  Laterality: Right;  w/ Talc  Pleurodesis, planned admit for Obs afterwards   CHEST TUBE INSERTION Right 04/28/2022   Procedure: INSERTION PLEURAL DRAINAGE CATHETER - Pigtail tail, drainage;  Surgeon: Claudene Toribio BROCKS, MD;  Location: [facility] ENDOSCOPY;  Service: Pulmonary;  Laterality: Right;  talc  pleurodesis   CHEST TUBE INSERTION Right 09/01/2022   Procedure: INSERTION PLEURAL DRAINAGE CATHETER;  Surgeon: Alaine Vicenta NOVAK, MD;  Location: [facility] ENDOSCOPY;  Service: Cardiopulmonary;  Laterality: Right;  pigtail catheter placement with talc  pleurodesis   COLONOSCOPY WITH PROPOFOL  N/A 12/17/2018   Procedure: COLONOSCOPY WITH PROPOFOL ;  Surgeon: Janalyn Keene NOVAK, MD;  Location: [facility] ENDOSCOPY;  Service: Gastroenterology;  Laterality: N/A;   IR IMAGING GUIDED PORT INSERTION  04/17/2020   IR THORACENTESIS ASP PLEURAL SPACE W/IMG GUIDE  11/29/2021   IR THORACENTESIS ASP PLEURAL SPACE W/IMG GUIDE  01/03/2022   JOINT REPLACEMENT Bilateral    REPLACEMENT TOTAL KNEE BILATERAL  2015   TALC  PLEURODESIS  09/01/2022   Procedure: TALC  PLEURADESIS;  Surgeon: Alaine Vicenta NOVAK, MD;  Location: [facility] ENDOSCOPY;  Service: Cardiopulmonary;;   TONSILLECTOMY  1960   TRANSTHORACIC ECHOCARDIOGRAM  05/2020   a) 05/2020: EF 55 to 60%.  No R WMA.  Mild LVH.  Indeterminate LVEDP.  Unable to assess RVP.  Normal aortic and mitral valves.  Mildly elevated RAP.; b) 09/2021: EF 50-55%. No RWMA. Mild LVH. ~ LVEDP. Mild LA Dilation. NORMAL RV/RAP.   Normal MV/AoV.    Social History: Social History   Tobacco Use   Smoking status: Former   Smokeless tobacco: Never  Advertising account planner   Vaping status: Never Used  Substance Use Topics   Alcohol use: Not Currently    Comment: rarely   Drug use: Not Currently    Allergies  Allergen Reactions   Bupropion Other (See Comments)    Racing heart   Hydrochlorothiazide Other (See Comments)    Cramping to lower extremities    Family History  Problem Relation Age of Onset   Heart attack Mother    Cancer Mother    Hypertension Mother    Arthritis Father    Asthma Father    Cancer Father    COPD Father    Heart attack Father    Hypertension Sister    Cancer Sister    Diabetes Sister    Asthma Sister    Cancer Sister    COPD Sister    Arthritis Sister    Asthma Sister    Diabetes Sister    Birth defects Maternal Grandfather    Arthritis Paternal Grandmother    Diabetes Paternal Grandmother    Arthritis Paternal Grandfather    Asthma Son    Other Neg Hx        pituitary disorder     Prior to Admission medications   Medication  Sig Start Date End Date Taking? Authorizing Provider  albuterol  (VENTOLIN  HFA) 108 (90 Base) MCG/ACT inhaler Inhale 2 puffs into the lungs every 4 (four) hours as needed for shortness of breath. 05/30/24   [name], NP  B-D 3CC LUER-LOK SYR 22GX1 22G X 1 3 ML MISC USE AS INSTRUCTED FOR TESTOSTERONE  INJECTION EVERY 2 WEEKS 03/20/21   Clark, Katherine K, NP  Black Pepper-Turmeric (TURMERIC COMPLEX/BLACK PEPPER PO) Take 1 tablet by mouth in the morning and at bedtime.    [provider]  Coenzyme Q10 (COQ10) 200 MG CAPS Take 200 mg by mouth at bedtime.    [provider]  cyclobenzaprine  (FLEXERIL ) 10 MG tablet Take 1 tablet (10 mg total) by mouth 2 (two) times daily as needed for muscle spasms. 07/29/24   Curatolo, Adam, DO  docusate sodium  (COLACE) 100 MG capsule Take 1 capsule (100 mg total) by mouth every 12 (twelve) hours.  08/02/23   Mannie Pac T, DO  ELIQUIS  5 MG TABS tablet TAKE 1 TABLET BY MOUTH TWICE A DAY 12/06/23   Heilingoetter, Cassandra L, PA-C  esomeprazole (NEXIUM) 20 MG capsule Take 20 mg by mouth in the morning.    [provider]  gabapentin  (NEURONTIN ) 300 MG capsule Take 1 capsule (300 mg total) by mouth 2 (two) times daily. For back pain. 04/26/24   Pickenpack-Cousar, Fannie SAILOR, NP  glucose blood (ONETOUCH ULTRA) test strip USE UP TO 4 TIMES DAILY AS DIRECTED 07/29/22   [name], NP  Insulin  Pen Needle (BD PEN NEEDLE NANO U/F) 32G X 4 MM MISC Use with insulin  as prescribed Dx Code: E11.9 05/20/23   [name], NP  ipratropium-albuterol  (DUONEB) 0.5-2.5 (3) MG/3ML SOLN Take 3 mLs by nebulization every 4 (four) hours while awake for 3 days, THEN 3 mLs every 4 (four) hours as needed (shortness of breath or wheezing). 01/07/24 07/28/24  Clark, Katherine K, NP  KRILL OIL PO Take 1 capsule by mouth daily.    [provider]  Lancets MISC USE UP TO 3 TIMES DAILY AS DIRECTED 05/06/18   Clark, Katherine K, NP  lidocaine  (LIDODERM ) 5 % Place 1 patch onto the skin daily. Remove & Discard patch within 12 hours or as directed by MD 05/19/23   Kommor, Lum, MD  losartan  (COZAAR ) 50 MG tablet TAKE 1 TABLET (50 MG TOTAL) BY MOUTH DAILY. FOR BLOOD PRESSURE. 08/20/23   Clark, Katherine K, NP  Melatonin 10 MG CAPS Take 10 mg by mouth at bedtime as needed (for sleep).    [provider]  metFORMIN  (GLUCOPHAGE ) 1000 MG tablet TAKE 1 TABLET (1,000 MG TOTAL) BY MOUTH 2 (TWO) TIMES DAILY WITH A MEAL. FOR DIABETES 07/15/24   [name], NP  mirtazapine  (REMERON ) 15 MG tablet TAKE 1 TABLET BY MOUTH EVERYDAY AT BEDTIME 02/28/24   Heilingoetter, Cassandra L, PA-C  morphine  (MS CONTIN ) 30 MG 12 hr tablet Take 1 tablet (30 mg total) by mouth every 8 (eight) hours for 10 doses. 07/29/24 08/02/24  Ruthe Cornet, DO  Multiple Vitamin (MULTI-VITAMINS) TABS Take 1 tablet by mouth daily with  breakfast.    [provider]  Naloxone  HCl 3 MG/0.1ML LIQD Place 1 spray into both nostrils once. For known/suspected opiod overdose. Every 2-3 minutes in alternating nostril till EMS arrives. 10/06/23   [provider]  nystatin  (MYCOSTATIN ) 100000 UNIT/ML suspension Take 5 mLs (500,000 Units total) by mouth 4 (four) times daily. 02/29/24   Heilingoetter, Cassandra L, PA-C  ondansetron  (ZOFRAN ) 8 MG tablet Take 1 tablet (8 mg total) by mouth every 8 (eight) hours as needed for nausea or vomiting. 07/11/24   Heilingoetter, Cassandra L, PA-C  oxyCODONE -acetaminophen  (PERCOCET) 10-325 MG tablet Take 1 tablet by mouth every 4 (four) hours as needed for up to 10 days. 07/29/24 08/08/24  Curatolo, Adam, DO  pioglitazone  (ACTOS ) 45 MG tablet TAKE 1 TABLET (45 MG TOTAL) BY MOUTH DAILY. FOR DIABETES. 02/14/24   Clark, Katherine K, NP  polyethylene glycol powder (GLYCOLAX /MIRALAX ) 17 GM/SCOOP powder Take by mouth once.    [provider]  pravastatin  (PRAVACHOL ) 40 MG tablet TAKE 1 TABLET BY MOUTH EVERY DAY IN THE EVENING FOR CHOLESTEROL 08/21/23   Clark, Katherine K, NP  PRESCRIPTION MEDICATION CPAP- At bedtime    [provider]  testosterone  cypionate (DEPOTESTOSTERONE CYPIONATE) 200 MG/ML injection Inject 200 mg into the muscle every 14 (fourteen) days.    [provider]    Physical Exam: Vitals:   07/31/24 2000 07/31/24 2100 07/31/24 2130 07/31/24 2159  BP: 126/67 132/67 121/68   Pulse: 98 86 (!) 53 (!) 105  Resp: 16 (!) 0    Temp:    97.6 F (36.4 C)  TempSrc:      SpO2: 95% 95% 97% 94%  Weight:      Height:       Constitutional: Sitting up on the side of the bed, NAD, calm, appears somewhat uncomfortable Eyes: EOMI, lids and conjunctivae normal ENMT: Mucous membranes are moist. Posterior pharynx clear of any exudate or lesions.Normal dentition.  Neck: normal, supple, no masses. Respiratory: clear to auscultation bilaterally, no wheezing, no crackles.  Normal respiratory effort. No accessory muscle use.  Cardiovascular: Regular rate and rhythm, no murmurs / rubs / gallops. No extremity edema. 2+ pedal pulses. Abdomen: no tenderness, no masses palpated. Musculoskeletal: no clubbing / cyanosis. No joint deformity upper and lower extremities. Good ROM, no contractures. Normal muscle tone.  Some tenderness over spinous processes throughout, increased in the lumbar region. Skin: no rashes, lesions, ulcers. No induration Neurologic: Sensation intact. Strength 5/5 in all 4.  Psychiatric: Normal judgment and insight. Alert and oriented x 3. Normal mood.   EKG: Personally reviewed. Initial EKG showed sinus rhythm, rate 95, first-degree AV block, low voltage.  Repeat EKG showed prolonged PR interval, rate 103, question of nonconducting P waves and Mobitz type II AV block.  Assessment/Plan Principal Problem:   Acute on chronic back pain Active Problems:   Essential hypertension   Type 2 diabetes mellitus (HCC)   OSA (obstructive sleep apnea)   Hyperlipidemia associated with type 2 diabetes mellitus (HCC)   Small cell lung cancer, right upper lobe (HCC)   History of pulmonary embolism   COPD (chronic obstructive pulmonary disease) (HCC)   Atypical chest pain   Adam Wise is a 73 y.o. 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  Assessment and Plan: Acute on chronic lower back pain Severe lumbar spinal stenosis: Patient presenting with uncontrollable acute on chronic back pain.  He has known severe lumbar spinal stenosis at L3-L4 and moderate at L2-L3 and L4-L5.  He does not have any focal weakness, new numbness/tingling, or loss of bowel/bladder control.  His home pain regimen has not been able to adequately  control  his pain. - MS Contin  30 mg every 8 hours - Percocet 10-325 mg every 4 hours as needed - IV Dilaudid  1 mg q2h only as needed for severe breakthrough pain - Continue gabapentin  and Flexeril  prn - PT eval  Abnormal EKG: There was concern for Mobitz type II AV block on EKG.  EDP discussed with cardiology on-call who felt that this was more likely first-degree AV block with blocked PACs rather than Mobitz type II.  Troponin is negative x 2.  Will keep on telemetry for close monitoring.  Stage IV small cell lung cancer with hepatic and osseous metastases: Follows with oncology, Dr. Sherrod.  On active treatment with topotecan , next infusion scheduled for tomorrow 8/18.  Imaging in the ED is concerning for tumor recurrence of the right lung as well as increased in size of hepatic lesions and peripancreatic/porta hepatis lymph nodes.  COPD: Stable.  Continue albuterol  as needed.  History of PE: Continue Eliquis .  Type 2 diabetes: Holding metformin .  Placed on SSI.  Hemoglobin A1c 6.7%.  Hypertension: Continue losartan .  Chronic anemia: Hemoglobin stable at 8.5.  Patient required blood transfusion on 8/13.  Hyperlipidemia: Continue pravastatin .   DVT prophylaxis: apixaban  (ELIQUIS ) tablet 5 mg Start: 07/31/24 2215 apixaban  (ELIQUIS ) tablet 5 mg   Code Status: Full code, confirmed with patient on admission Family Communication: Spouse at bedside Disposition Plan: From home, dispo pending clinical progress Consults called: None Severity of Illness: The appropriate patient status for this patient is OBSERVATION. Observation status is judged to be reasonable and necessary in order to provide the required intensity of service to ensure the patient's safety. The patient's presenting symptoms, physical exam findings, and initial radiographic and laboratory data in the context of their medical condition is felt to place them at decreased risk for further clinical deterioration.  Furthermore, it is anticipated that the patient will be medically stable for discharge from the hospital within 2 midnights of admission.   Jorie Blanch MD [facility]  If 7PM-7AM, please contact night-coverage [email or web address]  07/31/2024, 10:57 PM

## 2024-07-31 NOTE — Progress Notes (Signed)
 Pt arrived to the unit from ED via wheelchair. Pt alert and oriented x4, and oriented to surroundings. POC reviewed with pt, pt verbalized understanding. Pt stated he informed his wife of room number prior to his transport to the unit. Pt medicated for pain per orders. Telemetry on, bed alarm for safety, pt ambulates with cane, call light within reach.

## 2024-07-31 NOTE — ED Provider Notes (Signed)
 [place] EMERGENCY DEPARTMENT AT [facility] Provider Note   CSN: [id number] Arrival date & time: 07/31/24  1625     Patient presents with: Back Pain   Adam Wise is a 73 y.o. male.   73 year old male presenting with back pain and chest pain.  Patient was seen in the emergency department 2 days ago for similar complaint, he tells me that what is different about today is his sharp chest pain lasting between 30 minutes and 1 hour, his wife gave him 481 mg aspirin  at home, he received nitroglycerin , fentanyl , and ketamine en route with EMS.  His chest pain resolved after receiving aspirin .  Describes chest pain as central with radiation to right and left chest, no radiation to bilateral upper extremities.  Denies associated shortness of breath, nausea, vomiting.  He is scheduled to see a pain management specialist this week with [facility] for management of his chronic pain, he is on morphine /Percocet/Flexeril  and this was refilled by the previous ED provider 2 days ago.  He took an oxycodone  around 10 AM.   Back Pain      Prior to Admission medications   Medication Sig Start Date End Date Taking? Authorizing Provider  albuterol  (VENTOLIN  HFA) 108 (90 Base) MCG/ACT inhaler Inhale 2 puffs into the lungs every 4 (four) hours as needed for shortness of breath. 05/30/24   [name], NP  B-D 3CC LUER-LOK SYR 22GX1 22G X 1 3 ML MISC USE AS INSTRUCTED FOR TESTOSTERONE  INJECTION EVERY 2 WEEKS 03/20/21   Clark, Katherine K, NP  Black Pepper-Turmeric (TURMERIC COMPLEX/BLACK PEPPER PO) Take 1 tablet by mouth in the morning and at bedtime.    [provider]  Coenzyme Q10 (COQ10) 200 MG CAPS Take 200 mg by mouth at bedtime.    [provider]  cyclobenzaprine  (FLEXERIL ) 10 MG tablet Take 1 tablet (10 mg total) by mouth 2 (two) times daily as needed for muscle spasms. 07/29/24   Curatolo, Adam, DO  docusate sodium  (COLACE) 100 MG capsule Take 1 capsule (100 mg total)  by mouth every 12 (twelve) hours. 08/02/23   Mannie Pac T, DO  ELIQUIS  5 MG TABS tablet TAKE 1 TABLET BY MOUTH TWICE A DAY 12/06/23   Heilingoetter, Cassandra L, PA-C  esomeprazole (NEXIUM) 20 MG capsule Take 20 mg by mouth in the morning.    [provider]  gabapentin  (NEURONTIN ) 300 MG capsule Take 1 capsule (300 mg total) by mouth 2 (two) times daily. For back pain. 04/26/24   Pickenpack-Cousar, Fannie SAILOR, NP  glucose blood (ONETOUCH ULTRA) test strip USE UP TO 4 TIMES DAILY AS DIRECTED 07/29/22   Clark, Katherine K, NP  Insulin  Pen Needle (BD PEN NEEDLE NANO U/F) 32G X 4 MM MISC Use with insulin  as prescribed Dx Code: E11.9 05/20/23   [name], NP  ipratropium-albuterol  (DUONEB) 0.5-2.5 (3) MG/3ML SOLN Take 3 mLs by nebulization every 4 (four) hours while awake for 3 days, THEN 3 mLs every 4 (four) hours as needed (shortness of breath or wheezing). 01/07/24 07/28/24  Clark, Katherine K, NP  KRILL OIL PO Take 1 capsule by mouth daily.    [provider]  Lancets MISC USE UP TO 3 TIMES DAILY AS DIRECTED 05/06/18   Clark, Katherine K, NP  lidocaine  (LIDODERM ) 5 % Place 1 patch onto the skin daily. Remove & Discard patch within 12 hours or as directed by MD 05/19/23   Kommor, Lum, MD  losartan  (COZAAR ) 50 MG tablet TAKE  1 TABLET (50 MG TOTAL) BY MOUTH DAILY. FOR BLOOD PRESSURE. 08/20/23   Clark, Katherine K, NP  Melatonin 10 MG CAPS Take 10 mg by mouth at bedtime as needed (for sleep).    [provider]  metFORMIN  (GLUCOPHAGE ) 1000 MG tablet TAKE 1 TABLET (1,000 MG TOTAL) BY MOUTH 2 (TWO) TIMES DAILY WITH A MEAL. FOR DIABETES 07/15/24   Clark, Katherine K, NP  mirtazapine  (REMERON ) 15 MG tablet TAKE 1 TABLET BY MOUTH EVERYDAY AT BEDTIME 02/28/24   Heilingoetter, Cassandra L, PA-C  morphine  (MS CONTIN ) 30 MG 12 hr tablet Take 1 tablet (30 mg total) by mouth every 8 (eight) hours for 10 doses. 07/29/24 08/02/24  Ruthe Cornet, DO  Multiple Vitamin (MULTI-VITAMINS) TABS  Take 1 tablet by mouth daily with breakfast.    [provider]  Naloxone  HCl 3 MG/0.1ML LIQD Place 1 spray into both nostrils once. For known/suspected opiod overdose. Every 2-3 minutes in alternating nostril till EMS arrives. 10/06/23   [provider]  nystatin  (MYCOSTATIN ) 100000 UNIT/ML suspension Take 5 mLs (500,000 Units total) by mouth 4 (four) times daily. 02/29/24   Heilingoetter, Cassandra L, PA-C  ondansetron  (ZOFRAN ) 8 MG tablet Take 1 tablet (8 mg total) by mouth every 8 (eight) hours as needed for nausea or vomiting. 07/11/24   Heilingoetter, Cassandra L, PA-C  oxyCODONE -acetaminophen  (PERCOCET) 10-325 MG tablet Take 1 tablet by mouth every 4 (four) hours as needed for up to 10 days. 07/29/24 08/08/24  Curatolo, Adam, DO  pioglitazone  (ACTOS ) 45 MG tablet TAKE 1 TABLET (45 MG TOTAL) BY MOUTH DAILY. FOR DIABETES. 02/14/24   Clark, Katherine K, NP  polyethylene glycol powder (GLYCOLAX /MIRALAX ) 17 GM/SCOOP powder Take by mouth once.    [provider]  pravastatin  (PRAVACHOL ) 40 MG tablet TAKE 1 TABLET BY MOUTH EVERY DAY IN THE EVENING FOR CHOLESTEROL 08/21/23   Clark, Katherine K, NP  PRESCRIPTION MEDICATION CPAP- At bedtime    [provider]  testosterone  cypionate (DEPOTESTOSTERONE CYPIONATE) 200 MG/ML injection Inject 200 mg into the muscle every 14 (fourteen) days.    [provider]    Allergies: Bupropion and Hydrochlorothiazide    Review of Systems  Musculoskeletal:  Positive for back pain.    Updated Vital Signs  Vitals:   07/31/24 1629 07/31/24 1630 07/31/24 1639 07/31/24 1900  BP:   108/78 139/77  Pulse:   98 82  Resp:   16 15  Temp:   97.9 F (36.6 C)   TempSrc:   Oral   SpO2:   97% 95%  Weight:  108.9 kg    Height: 5' 9 (1.753 m)        Physical Exam Vitals and nursing note reviewed.  HENT:     Head: Normocephalic.  Eyes:     Extraocular Movements: Extraocular movements intact.  Cardiovascular:     Rate and  Rhythm: Normal rate and regular rhythm.     Heart sounds: Normal heart sounds.  Pulmonary:     Effort: Pulmonary effort is normal.     Breath sounds: Normal breath sounds.  Musculoskeletal:     Cervical back: Normal range of motion.     Comments: 5/5 strength against resistance of bilateral lower extremities Back: No C-spine tenderness palpation.  Mild tenderness to palpation ~T12 through L-spine with paralumbar/parathoracic muscle tenderness to palpation L>R.  Skin:    General: Skin is warm and dry.  Neurological:     Mental Status: He is alert and oriented to person, place, and  time.     Sensory: No sensory deficit.     Motor: No weakness.     Comments: No appreciable sensory deficits or weakness Ambulates with a cane     (all labs ordered are listed, but only abnormal results are displayed) Labs Reviewed  CBC - Abnormal; Notable for the following components:      Result Value   RBC 2.75 (*)    Hemoglobin 8.5 (*)    HCT 28.1 (*)    MCV 102.2 (*)    RDW 18.6 (*)    All other components within normal limits  BASIC METABOLIC PANEL WITH GFR - Abnormal; Notable for the following components:   Glucose, Bld 136 (*)    Calcium 8.2 (*)    All other components within normal limits  URINALYSIS, ROUTINE W REFLEX MICROSCOPIC  TROPONIN I (HIGH SENSITIVITY)  TROPONIN I (HIGH SENSITIVITY)    EKG: EKG Interpretation Date/Time:  Sunday July 31 2024 16:42:21 EDT Ventricular Rate:  95 PR Interval:  210 QRS Duration:  97 QT Interval:  365 QTC Calculation: 459 R Axis:   74  Text Interpretation: Sinus rhythm Borderline prolonged PR interval Low voltage, extremity and precordial leads Confirmed by Franklyn Gills [phone]) on 07/31/2024 5:23:27 PM  Radiology: CT Angio Chest/Abd/Pel for Dissection W and/or Wo Contrast Result Date: 07/31/2024 CLINICAL DATA:  Chest pain back pain lung cancer EXAM: CT ANGIOGRAPHY CHEST, ABDOMEN AND PELVIS TECHNIQUE: Non-contrast CT of the chest was initially  obtained. Multidetector CT imaging through the chest, abdomen and pelvis was performed using the standard protocol during bolus administration of intravenous contrast. Multiplanar reconstructed images and MIPs were obtained and reviewed to evaluate the vascular anatomy. RADIATION DOSE REDUCTION: This exam was performed according to the departmental dose-optimization program which includes automated exposure control, adjustment of the mA and/or kV according to patient size and/or use of iterative reconstruction technique. CONTRAST:  OMNIPAQUE  IOHEXOL  350 MG/ML SOLN COMPARISON:  Chest x-ray 07/31/2024, CT 06/09/2024, 03/08/2024, 05/19/2023, 05/01/2023, multiple exams dating back to 03/26/2020 FINDINGS: CTA CHEST FINDINGS Cardiovascular: Non contrasted images of the chest demonstrate no acute intramural hematoma. Mild aortic atherosclerosis. No dissection is seen. Ectatic ascending aorta up to 3.8 cm. Coronary vascular calcification. Cardiomegaly. Trace pericardial effusion. Right-sided central venous port with tip in the right atrium. Mediastinum/Nodes: Patent trachea. No thyroid  mass. Progressive soft tissue density within the mediastinum, most evident in the subcarinal region, series 11 image 56 and surrounding the right bronchus, series 11 image 50 through 58. Increased right hilar soft tissue density compared to prior. Esophagus within normal limits. Lungs/Pleura: Volume loss right hemithorax as before. Suspect slight increased pleural thickening compared to prior. Right Peri hilar distortion and bronchiectasis corresponding to post radiation change but progressive confluent and more masslike consolidation with decreased air bronchograms compared to prior and raising concern for tumor recurrence. Overall consolidative process is increased compared to the prior CT exams. Underlying mild fibrotic change at the right middle lobe but with interval increase in interstitial and ground-glass density. New irregular  focus of airspace disease and ground-glass density in the anterior right base, series 7, image 93 measuring 10 mm. This is contiguous with irregular airspace disease in the anterior right lung base, series 7, image 97, with surrounding ground-glass. Within the posterior, medial right base, there is new heterogeneous ground-glass density and multiple small irregular nodular densities, for example 9 mm density on series 7, image 108 and adjacent 6 mm densities on series 7, image 108 and 107. Chronic mild  hyperdense material in the right posterior pleural space. Musculoskeletal: Sternum appears intact. Multilevel degenerative changes. No definite acute osseous abnormality is seen. Review of the MIP images confirms the above findings. CTA ABDOMEN AND PELVIS FINDINGS VASCULAR Aorta: Normal caliber aorta without aneurysm, dissection, vasculitis or significant stenosis. Moderate aortic atherosclerosis. Celiac: Patent without evidence of aneurysm, dissection, vasculitis or significant stenosis. SMA: Patent without evidence of aneurysm, dissection, vasculitis or significant stenosis. Renals: Both renal arteries are patent without evidence of aneurysm, dissection, vasculitis, fibromuscular dysplasia or significant stenosis. IMA: Patent without evidence of aneurysm, dissection, vasculitis or significant stenosis. Inflow: Patent without evidence of aneurysm, dissection, vasculitis or significant stenosis. Veins: Suboptimally assessed Review of the MIP images confirms the above findings. NON-VASCULAR Hepatobiliary: Nodular hepatic contour suggests possible cirrhosis change. Hepatic metastatic disease. Right hepatic dome lesion measures about 6.8 x 4.6 cm, previously 5.8 x 4.7 cm but was better seen on venous phase imaging. Vague caudate lesion measures 2.1 cm on series 11, image 123, 17 mm on the prior exam. No calcified gallstone or biliary dilatation. Fiducial markers in the inferior right hepatic lobe, adjacent to hypodense  metastatic focus, this lesion measures 19 mm on series 11, image 123, previously 14 mm. Pancreas: Atrophic.  No acute inflammation Spleen: Heterogenous enhancement likely due to arterial phase. Adrenals/Urinary Tract: Stable negative left adrenal gland. Stable 15 mm right adrenal nodule. The kidneys show no hydronephrosis. Right renal cysts for which no imaging follow-up is recommended. The bladder is unremarkable. Stomach/Bowel: The stomach is decompressed but suspect mild diffuse gastric wall thickening. There is no dilated small bowel. No acute bowel wall thickening. Lymphatic: Multiple enlarged peripancreatic and porta hepatis nodes. Node adjacent to the proximal pancreas measures 31 mm on series 11, image 30, previously 21 mm. Node adjacent to the inferior pancreatic head measures 36 mm on series 11, image 146, previously 26 mm. Reproductive: Negative prostate Other: Negative for pelvic effusion or free air Musculoskeletal: No acute osseous abnormality. No suspicious bone lesion. Advanced disc space narrowing at L4-L5 with moderate severe canal stenosis due to combination of disc disease and posterior ligamentum flavum and facet disease. Disc space narrowing at L3-L4 with gas locules in the right foramen. Severe canal stenosis suspected likely due to disc disease and facet hypertrophy. 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 Review of the MIP images confirms the above findings. IMPRESSION: 1. Negative for acute aortic dissection or aneurysm. 2. Post therapeutic changes in the right hilar/suprahilar lung but with interim progressive masslike consolidative process in the right perihilar region with decreased air bronchograms compared to prior, raising concern for tumor recurrence. Progressive soft tissue density within the mediastinum concerning for tumor recurrence/metastatic disease. 3. New irregular foci of airspace disease/nodularity and ground-glass density in the right middle lobe and right lung base as described above,  indeterminate for tumor versus infectious or inflammatory process. 4. Hepatic metastatic disease with slight interval increase in size of hepatic lesions 5. Increase in size of peripancreatic and porta hepatis lymph nodes consistent with disease progression. 6. Advanced degenerative changes of the lumbar spine with suspected moderate to severe canal stenosis at L3-L4 and L4-L5. These findings would be better assessed with MRI Aortic Atherosclerosis (ICD10-I70.0). Electronically Signed   By: Luke Bun M.D.   On: 07/31/2024 18:48   DG Chest Portable 1 View Result Date: 07/31/2024 CLINICAL DATA:  Chest pain EXAM: PORTABLE CHEST 1 VIEW COMPARISON:  01/04/2024, CT 06/09/2024, radiograph 08/10/2023, 09/18/2022 FINDINGS: Right-sided central venous port with tip at the cavoatrial region. Volume  loss right thorax with distortion and masslike consolidation in the right hilar/suprahilar lung appears slightly more confluent compared to the chest x-ray from January. Pleural thickening and or fluid not significantly changed. Stable enlarged cardiomediastinal silhouette. No pneumothorax IMPRESSION: Volume loss right thorax with distortion and masslike consolidation in the right hilar/suprahilar lung appears slightly more confluent compared to the chest x-ray from January but probably stable compared with scout image from CT in June and likely related to post treatment changes. Contrast-enhanced chest CT follow-up if indicated. Similar right pleural fluid and or thickening. Cardiomegaly Electronically Signed   By: Luke Bun M.D.   On: 07/31/2024 17:30     Procedures   Medications Ordered in the ED  iohexol  (OMNIPAQUE ) 350 MG/ML injection 100 mL (100 mLs Intravenous Contrast Given 07/31/24 1800)  HYDROmorphone  (DILAUDID ) injection 1 mg (1 mg Intravenous Given 07/31/24 1911)                                    Medical Decision Making This patient presents to the ED for concern of back pain and chest pain, this  involves an extensive number of treatment options, and is a complaint that carries with it a high risk of complications and morbidity.  The differential diagnosis includes chronic back pain, degenerative disc disease, ACS, dissection, AAA.    Co morbidities that complicate the patient evaluation  Small cell lung cancer, hypertension, hyperlipidemia, type 2 diabetes   Additional history obtained:  Additional history obtained from record review External records from outside source obtained and reviewed including recent emergency department note, recent PCP note   Lab Tests:  I Ordered, and personally interpreted labs.  The pertinent results include: CBC notable for hemoglobin of 8.5, however this is largely stable as compared to his baseline, otherwise appears stable from previous; platelet count is now normalized to 200, has ranged from 19-128 over the past 3 weeks. BMP stable as compared to previous. Initial troponin 11, repeat pending at time of shift change.  Urinalysis within normal limits.   Imaging Studies ordered:  I ordered imaging studies including chest x-ray, CT dissection study I independently visualized and interpreted imaging which showed  - CXR: Volume loss right thorax with distortion and masslike consolidation in the right hilar/suprahilar lung appears slightly more confluent compared to the chest x-ray from January but probably stable compared with scout image from CT in June and likely related to post treatment changes. Contrast-enhanced chest CT follow-up if indicated. Similar right pleural fluid and or thickening.  - CT dissection study: 1. Negative for acute aortic dissection or aneurysm. 2. Post therapeutic changes in the right hilar/suprahilar lung but with interim progressive masslike consolidative process in the right perihilar region with decreased air bronchograms compared to prior, raising concern for tumor recurrence. Progressive soft tissue density within the  mediastinum concerning for tumor recurrence/metastatic disease. 3. New irregular foci of airspace disease/nodularity and ground-glass density in the right middle lobe and right lung base as described above, indeterminate for tumor versus infectious or inflammatory process. 4. Hepatic metastatic disease with slight interval increase in size of hepatic lesions 5. Increase in size of peripancreatic and porta hepatis lymph nodes consistent with disease progression. 6. Advanced degenerative changes of the lumbar spine with suspected moderate to severe canal stenosis at L3-L4 and L4-L5. These findings would be better assessed with MRI. Aortic Atherosclerosis (ICD10-I70.0). Cardiomegaly  I agree with the radiologist interpretation  Cardiac Monitoring: / EKG:  The patient was maintained on a cardiac monitor.  I personally viewed and interpreted the cardiac monitored which showed an underlying rhythm of: NSR  Problem List / ED Course / Critical interventions / Medication management  Patient received aspirin  at home prior to EMS arrival, en route with EMS he received nitroglycerin /fentanyl /ketamine I ordered medication including Dilaudid  for back pain Reevaluation of the patient after these medicines showed that the patient stayed the same I have reviewed the patients home medicines and have made adjustments as needed   Social Determinants of Health:  Former tobacco use   Test / Admission - Considered:  Physical exam is notable as above, at time of my assessment he is in no acute distress however he did receive fentanyl /ketamine and route with EMS and tells me that this has helped his back pain a bit, however he still describes it as an 8/10.  Patient tells me he is not currently suffering from chest pain or shortness of breath, given his history of malignancy as well as severe excruciating back pain with associated chest pain today, will order CT dissection study. CT results as above, negative for  dissection however does show several findings consistent with worsening metastatic disease.  I discussed these findings in depth with the patient and his wife, at the time of my reassessment he had received Dilaudid  approximately 15 to 20 minutes prior but is still in significant amount of pain.  He has an appointment with a pain management specialist this Thursday, however he and his wife voiced concern that he will continue to have excruciating pain prior to seeing pain management, as his current regimen including morphine /Percocet/Flexeril  is not touching his pain.  Patient has not had any recurrence of his chest pain, when I am in the room during my repeat assessment, his monitor is reading intermittently in A-fib, when he feels pulses they do feel irregular at times or like a beat is being skipped, he has no history of irregular rhythms and he is on Eliquis ; a second EKG was obtained and there was a concern for a Mobitz type II block.  His second troponin is pending at time of shift change.   Staffed with Dr. Franklyn, handed-off to Dr. Franklyn at shift change with second troponin pending as well as reassessment for adequate control of patient's back pain.  Amount and/or Complexity of Data Reviewed Labs: ordered. Radiology: ordered.  Risk Prescription drug management.        Final diagnoses:  Chronic low back pain without sciatica, unspecified back pain laterality  Chest pain, unspecified type    ED Discharge Orders     None          Glendia Rocky LOISE DEVONNA 07/31/24 2008    Franklyn Sid LOISE, MD 07/31/24 2214

## 2024-07-31 NOTE — ED Notes (Signed)
 .ED TO INPATIENT HANDOFF REPORT  Name/Age/Gender Adam Wise 73 y.o. male  Code Status Code Status History     Date Active Date Inactive Code Status Order ID Comments User Context   08/10/2023 2150 08/15/2023 1826 Full Code [id number]  Laveda Roosevelt, MD ED   01/01/2023 1508 01/02/2023 0521 Full Code [id number]  Philip Cornet, MD HOV   09/01/2022 1819 09/02/2022 1821 Full Code [id number]  Alaine Vicenta NOVAK, MD Inpatient   10/11/2021 2156 10/13/2021 2200 Full Code [id number]  Rockey Denece LABOR, DO ED   05/15/2020 2107 05/17/2020 1822 Full Code [id number]  Franky Redia SAILOR, MD ED    Questions for Most Recent Historical Code Status (Order [id number])     Question Answer   By: Consent: discussion documented in EHR            Home/SNF/Other Home  Chief Complaint Acute on chronic back pain [M54.9, G89.29]  Level of Care/Admitting Diagnosis ED Disposition     ED Disposition  Admit   Condition  --   Comment  Hospital Area: [facility] [[id number]]  Level of Care: Telemetry [5]  Admit to tele based on following criteria: Monitor QTC interval  May place patient in observation at [facility] or [facility] if equivalent level of care is available:: No  Covid Evaluation: Asymptomatic - no recent exposure (last 10 days) testing not required  Diagnosis: Acute on chronic back pain [8122129]  Admitting Physician: TOBIE JORIE SAUNDERS [[id number]]  Attending Physician: TOBIE JORIE SAUNDERS [[id number]]  For patients discharging to extended facilities (i.e. SNF, AL, group homes or LTAC) initiate:: Discharge to SNF/Facility Placement COVID-19 Lab Testing Protocol          Medical History Past Medical History:  Diagnosis Date   Acute on chronic respiratory failure with hypoxia (HCC) 10/12/2021   Chickenpox    Chronic knee pain    Chronic low back pain    COPD exacerbation (HCC) 10/11/2021   COPD with exacerbation (HCC) 10/12/2021   Coronary artery calcification seen on CAT scan 11/2021    Coronary CTA 11/27/2021: Moderate to large right pleural effusion and compressive atelectasis right lung base. => Coronary Calcium Score 657.  Diffuse RCA calcification.  Minimal mild disease in the LAD and diagonal branches. == Overall limited study.  Notable artifact.   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 He has disease progression after the first line of chemotherapy in December 2021.   Testosterone  deficiency    Type 2 diabetes mellitus (HCC)     Allergies Allergies  Allergen Reactions   Bupropion Other (See Comments)    Racing heart   Hydrochlorothiazide Other (See Comments)    Cramping to lower extremities    IV Location/Drains/Wounds Patient Lines/Drains/Airways Status     Active Line/Drains/Airways     Name Placement date Placement time Site Days   Implanted Port 04/17/20 Right Chest Single Power 04/17/20  1257  Chest  1566   Peripheral IV 07/31/24 20 G Anterior;Left Forearm 07/31/24  1629  Forearm  less than 1  Labs/Imaging Results for orders placed or performed during the hospital encounter of 07/31/24 (from the past 48 hours)  CBC     Status: Abnormal   Collection Time: 07/31/24  5:09 PM  Result Value Ref Range   WBC 4.6 4.0 - 10.5 K/uL   RBC 2.75 (L) 4.22 - 5.81 MIL/uL   Hemoglobin 8.5 (L) 13.0 - 17.0 g/dL   HCT 71.8 (L) 60.9 - 47.9 %   MCV 102.2 (H) 80.0 - 100.0 fL   MCH 30.9 26.0 - 34.0 pg   MCHC 30.2 30.0 - 36.0 g/dL   RDW 81.3 (H) 88.4 - 84.4 %   Platelets 200  150 - 400 K/uL   nRBC 0.0 0.0 - 0.2 %    Comment: Performed at [facility], [address] [place], [place] [address]  Basic metabolic panel     Status: Abnormal   Collection Time: 07/31/24  5:09 PM  Result Value Ref Range   Sodium 138 135 - 145 mmol/L   Potassium 4.4 3.5 - 5.1 mmol/L   Chloride 102 98 - 111 mmol/L   CO2 27 22 - 32 mmol/L   Glucose, Bld 136 (H) 70 - 99 mg/dL    Comment: Glucose reference range applies only to samples taken after fasting for at least 8 hours.   BUN 17 8 - 23 mg/dL   Creatinine, Ser 9.09 0.61 - 1.24 mg/dL   Calcium 8.2 (L) 8.9 - 10.3 mg/dL   GFR, Estimated >39 >39 mL/min    Comment: (NOTE) Calculated using the CKD-EPI Creatinine Equation (2021)    Anion gap 9 5 - 15    Comment: Performed at [facility], [address] [place], [place] [address]  Troponin I (High Sensitivity)     Status: None   Collection Time: 07/31/24  5:09 PM  Result Value Ref Range   Troponin I (High Sensitivity) 11 <18 ng/L    Comment: (NOTE) Elevated high sensitivity troponin I (hsTnI) values and significant  changes across serial measurements may suggest ACS but many other  chronic and acute conditions are known to elevate hsTnI results.  Refer to the Links section for chest pain algorithms and additional  guidance. Performed at [facility], [address], [place], [place] [address]   Urinalysis, Routine w reflex microscopic -Urine, Clean Catch     Status: None   Collection Time: 07/31/24  5:13 PM  Result Value Ref Range   Color, Urine YELLOW YELLOW   APPearance CLEAR CLEAR   Specific Gravity, Urine 1.026 1.005 - 1.030   pH 5.0 5.0 - 8.0   Glucose, UA NEGATIVE NEGATIVE mg/dL   Hgb urine dipstick NEGATIVE NEGATIVE   Bilirubin Urine NEGATIVE NEGATIVE   Ketones, ur NEGATIVE NEGATIVE mg/dL   Protein, ur NEGATIVE NEGATIVE mg/dL   Nitrite NEGATIVE NEGATIVE   Leukocytes,Ua NEGATIVE NEGATIVE    Comment:  Performed at [facility], [address], [place], [place] [address]  Troponin I (High Sensitivity)     Status: None   Collection Time: 07/31/24  8:04 PM  Result Value Ref Range   Troponin I (High Sensitivity) 11 <18 ng/L    Comment: (NOTE) Elevated high sensitivity troponin I (hsTnI) values and significant  changes across serial measurements may suggest ACS but many other  chronic and acute conditions are known to elevate hsTnI results.  Refer to the Links section for chest pain algorithms and additional  guidance. Performed at [facility],  [address] [place], [place] [address]    *Note: Due to a large number of results and/or encounters for the requested time period, some results have not been displayed. A complete set of results can be found in Results Review.   CT Angio Chest/Abd/Pel for Dissection W and/or Wo Contrast Result Date: 07/31/2024 CLINICAL DATA:  Chest pain back pain lung cancer EXAM: CT ANGIOGRAPHY CHEST, ABDOMEN AND PELVIS TECHNIQUE: Non-contrast CT of the chest was initially obtained. Multidetector CT imaging through the chest, abdomen and pelvis was performed using the standard protocol during bolus administration of intravenous contrast. Multiplanar reconstructed images and MIPs were obtained and reviewed to evaluate the vascular anatomy. RADIATION DOSE REDUCTION: This exam was performed according to the departmental dose-optimization program which includes automated exposure control, adjustment of the mA and/or kV according to patient size and/or use of iterative reconstruction technique. CONTRAST:  OMNIPAQUE  IOHEXOL  350 MG/ML SOLN COMPARISON:  Chest x-ray 07/31/2024, CT 06/09/2024, 03/08/2024, 05/19/2023, 05/01/2023, multiple exams dating back to 03/26/2020 FINDINGS: CTA CHEST FINDINGS Cardiovascular: Non contrasted images of the chest demonstrate no acute intramural hematoma. Mild aortic atherosclerosis. No dissection is  seen. Ectatic ascending aorta up to 3.8 cm. Coronary vascular calcification. Cardiomegaly. Trace pericardial effusion. Right-sided central venous port with tip in the right atrium. Mediastinum/Nodes: Patent trachea. No thyroid  mass. Progressive soft tissue density within the mediastinum, most evident in the subcarinal region, series 11 image 56 and surrounding the right bronchus, series 11 image 50 through 58. Increased right hilar soft tissue density compared to prior. Esophagus within normal limits. Lungs/Pleura: Volume loss right hemithorax as before. Suspect slight increased pleural thickening compared to prior. Right Peri hilar distortion and bronchiectasis corresponding to post radiation change but progressive confluent and more masslike consolidation with decreased air bronchograms compared to prior and raising concern for tumor recurrence. Overall consolidative process is increased compared to the prior CT exams. Underlying mild fibrotic change at the right middle lobe but with interval increase in interstitial and ground-glass density. New irregular focus of airspace disease and ground-glass density in the anterior right base, series 7, image 93 measuring 10 mm. This is contiguous with irregular airspace disease in the anterior right lung base, series 7, image 97, with surrounding ground-glass. Within the posterior, medial right base, there is new heterogeneous ground-glass density and multiple small irregular nodular densities, for example 9 mm density on series 7, image 108 and adjacent 6 mm densities on series 7, image 108 and 107. Chronic mild hyperdense material in the right posterior pleural space. Musculoskeletal: Sternum appears intact. Multilevel degenerative changes. No definite acute osseous abnormality is seen. Review of the MIP images confirms the above findings. CTA ABDOMEN AND PELVIS FINDINGS VASCULAR Aorta: Normal caliber aorta without aneurysm, dissection, vasculitis or significant  stenosis. Moderate aortic atherosclerosis. Celiac: Patent without evidence of aneurysm, dissection, vasculitis or significant stenosis. SMA: Patent without evidence of aneurysm, dissection, vasculitis or significant stenosis. Renals: Both renal arteries are patent without evidence of aneurysm, dissection, vasculitis, fibromuscular dysplasia or significant stenosis. IMA: Patent without evidence of aneurysm, dissection, vasculitis or significant stenosis. Inflow: Patent without evidence of aneurysm, dissection, vasculitis or significant stenosis. Veins: Suboptimally assessed Review of the MIP images confirms the above findings. NON-VASCULAR Hepatobiliary: Nodular hepatic contour suggests possible cirrhosis change. Hepatic metastatic disease. Right hepatic dome lesion measures about 6.8 x 4.6 cm, previously 5.8 x 4.7 cm but was better seen on venous phase imaging. Vague caudate lesion measures 2.1 cm on series 11, image 123, 17 mm  on the prior exam. No calcified gallstone or biliary dilatation. Fiducial markers in the inferior right hepatic lobe, adjacent to hypodense metastatic focus, this lesion measures 19 mm on series 11, image 123, previously 14 mm. Pancreas: Atrophic.  No acute inflammation Spleen: Heterogenous enhancement likely due to arterial phase. Adrenals/Urinary Tract: Stable negative left adrenal gland. Stable 15 mm right adrenal nodule. The kidneys show no hydronephrosis. Right renal cysts for which no imaging follow-up is recommended. The bladder is unremarkable. Stomach/Bowel: The stomach is decompressed but suspect mild diffuse gastric wall thickening. There is no dilated small bowel. No acute bowel wall thickening. Lymphatic: Multiple enlarged peripancreatic and porta hepatis nodes. Node adjacent to the proximal pancreas measures 31 mm on series 11, image 30, previously 21 mm. Node adjacent to the inferior pancreatic head measures 36 mm on series 11, image 146, previously 26 mm. Reproductive:  Negative prostate Other: Negative for pelvic effusion or free air Musculoskeletal: No acute osseous abnormality. No suspicious bone lesion. Advanced disc space narrowing at L4-L5 with moderate severe canal stenosis due to combination of disc disease and posterior ligamentum flavum and facet disease. Disc space narrowing at L3-L4 with gas locules in the right foramen. Severe canal stenosis suspected likely due to disc disease and facet hypertrophy. 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 Review of the MIP images confirms the above findings. IMPRESSION: 1. Negative for acute aortic dissection or aneurysm. 2. Post therapeutic changes in the right hilar/suprahilar lung but with interim progressive masslike consolidative process in the right perihilar region with decreased air bronchograms compared to prior, raising concern for tumor recurrence. Progressive soft tissue density within the mediastinum concerning for tumor recurrence/metastatic disease. 3. New irregular foci of airspace disease/nodularity and ground-glass density in the right middle lobe and right lung base as described above, indeterminate for tumor versus infectious or inflammatory process. 4. Hepatic metastatic disease with slight interval increase in size of hepatic lesions 5. Increase in size of peripancreatic and porta hepatis lymph nodes consistent with disease progression. 6. Advanced degenerative changes of the lumbar spine with suspected moderate to severe canal stenosis at L3-L4 and L4-L5. These findings would be better assessed with MRI Aortic Atherosclerosis (ICD10-I70.0). Electronically Signed   By: Luke Bun M.D.   On: 07/31/2024 18:48   DG Chest Portable 1 View Result Date: 07/31/2024 CLINICAL DATA:  Chest pain EXAM: PORTABLE CHEST 1 VIEW COMPARISON:  01/04/2024, CT 06/09/2024, radiograph 08/10/2023, 09/18/2022 FINDINGS: Right-sided central venous port with tip at the cavoatrial region. Volume loss right thorax with distortion and masslike  consolidation in the right hilar/suprahilar lung appears slightly more confluent compared to the chest x-ray from January. Pleural thickening and or fluid not significantly changed. Stable enlarged cardiomediastinal silhouette. No pneumothorax IMPRESSION: Volume loss right thorax with distortion and masslike consolidation in the right hilar/suprahilar lung appears slightly more confluent compared to the chest x-ray from January but probably stable compared with scout image from CT in June and likely related to post treatment changes. Contrast-enhanced chest CT follow-up if indicated. Similar right pleural fluid and or thickening. Cardiomegaly Electronically Signed   By: Luke Bun M.D.   On: 07/31/2024 17:30    Pending Labs Unresulted Labs (From admission, onward)    None       Vitals/Pain Today's Vitals   07/31/24 2048 07/31/24 2100 07/31/24 2130 07/31/24 2144  BP:  132/67 121/68   Pulse:  86 (!) 53   Resp:  (!) 0    Temp:      TempSrc:  SpO2:  95% 97%   Weight:      Height:      PainSc: 8    10-Worst pain ever    Isolation Precautions No active isolations  Medications Medications  ondansetron  (ZOFRAN ) injection 4 mg (has no administration in time range)  diphenhydrAMINE  (BENADRYL ) injection 25 mg (has no administration in time range)  iohexol  (OMNIPAQUE ) 350 MG/ML injection 100 mL (100 mLs Intravenous Contrast Given 07/31/24 1800)  HYDROmorphone  (DILAUDID ) injection 1 mg (1 mg Intravenous Given 07/31/24 1911)  HYDROmorphone  (DILAUDID ) injection 1 mg (1 mg Intravenous Given 07/31/24 2144)    Mobility walks

## 2024-07-31 NOTE — Hospital Course (Addendum)
 Adam Wise is a 73 y.o. 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

## 2024-08-01 ENCOUNTER — Encounter: Payer: Self-pay | Admitting: Internal Medicine

## 2024-08-01 ENCOUNTER — Encounter: Payer: Self-pay | Admitting: Primary Care

## 2024-08-01 ENCOUNTER — Encounter: Payer: Self-pay | Admitting: Radiation Oncology

## 2024-08-01 ENCOUNTER — Inpatient Hospital Stay: Payer: PRIVATE HEALTH INSURANCE

## 2024-08-01 ENCOUNTER — Inpatient Hospital Stay: Payer: PRIVATE HEALTH INSURANCE | Admitting: Internal Medicine

## 2024-08-01 ENCOUNTER — Other Ambulatory Visit: Payer: Self-pay | Admitting: Physician Assistant

## 2024-08-01 DIAGNOSIS — D696 Thrombocytopenia, unspecified: Secondary | ICD-10-CM | POA: Diagnosis present

## 2024-08-01 DIAGNOSIS — C3411 Malignant neoplasm of upper lobe, right bronchus or lung: Secondary | ICD-10-CM

## 2024-08-01 DIAGNOSIS — Z8249 Family history of ischemic heart disease and other diseases of the circulatory system: Secondary | ICD-10-CM | POA: Diagnosis not present

## 2024-08-01 DIAGNOSIS — G8929 Other chronic pain: Secondary | ICD-10-CM | POA: Diagnosis not present

## 2024-08-01 DIAGNOSIS — I2510 Atherosclerotic heart disease of native coronary artery without angina pectoris: Secondary | ICD-10-CM | POA: Diagnosis present

## 2024-08-01 DIAGNOSIS — R079 Chest pain, unspecified: Secondary | ICD-10-CM | POA: Diagnosis present

## 2024-08-01 DIAGNOSIS — G4733 Obstructive sleep apnea (adult) (pediatric): Secondary | ICD-10-CM | POA: Diagnosis present

## 2024-08-01 DIAGNOSIS — I10 Essential (primary) hypertension: Secondary | ICD-10-CM | POA: Diagnosis present

## 2024-08-01 DIAGNOSIS — M48061 Spinal stenosis, lumbar region without neurogenic claudication: Secondary | ICD-10-CM | POA: Diagnosis present

## 2024-08-01 DIAGNOSIS — Z79899 Other long term (current) drug therapy: Secondary | ICD-10-CM | POA: Diagnosis not present

## 2024-08-01 DIAGNOSIS — Z96653 Presence of artificial knee joint, bilateral: Secondary | ICD-10-CM | POA: Diagnosis present

## 2024-08-01 DIAGNOSIS — E1169 Type 2 diabetes mellitus with other specified complication: Secondary | ICD-10-CM | POA: Diagnosis present

## 2024-08-01 DIAGNOSIS — Z86711 Personal history of pulmonary embolism: Secondary | ICD-10-CM | POA: Diagnosis not present

## 2024-08-01 DIAGNOSIS — C787 Secondary malignant neoplasm of liver and intrahepatic bile duct: Secondary | ICD-10-CM | POA: Diagnosis present

## 2024-08-01 DIAGNOSIS — Z825 Family history of asthma and other chronic lower respiratory diseases: Secondary | ICD-10-CM | POA: Diagnosis not present

## 2024-08-01 DIAGNOSIS — Z87891 Personal history of nicotine dependence: Secondary | ICD-10-CM | POA: Diagnosis not present

## 2024-08-01 DIAGNOSIS — G894 Chronic pain syndrome: Secondary | ICD-10-CM | POA: Diagnosis present

## 2024-08-01 DIAGNOSIS — C7951 Secondary malignant neoplasm of bone: Secondary | ICD-10-CM | POA: Diagnosis present

## 2024-08-01 DIAGNOSIS — Z7901 Long term (current) use of anticoagulants: Secondary | ICD-10-CM | POA: Diagnosis not present

## 2024-08-01 DIAGNOSIS — E785 Hyperlipidemia, unspecified: Secondary | ICD-10-CM | POA: Diagnosis present

## 2024-08-01 DIAGNOSIS — I441 Atrioventricular block, second degree: Secondary | ICD-10-CM | POA: Diagnosis present

## 2024-08-01 DIAGNOSIS — D630 Anemia in neoplastic disease: Secondary | ICD-10-CM | POA: Diagnosis present

## 2024-08-01 DIAGNOSIS — Z833 Family history of diabetes mellitus: Secondary | ICD-10-CM | POA: Diagnosis not present

## 2024-08-01 DIAGNOSIS — J449 Chronic obstructive pulmonary disease, unspecified: Secondary | ICD-10-CM | POA: Diagnosis present

## 2024-08-01 DIAGNOSIS — M549 Dorsalgia, unspecified: Secondary | ICD-10-CM | POA: Diagnosis not present

## 2024-08-01 DIAGNOSIS — Z794 Long term (current) use of insulin: Secondary | ICD-10-CM | POA: Diagnosis not present

## 2024-08-01 DIAGNOSIS — Z7984 Long term (current) use of oral hypoglycemic drugs: Secondary | ICD-10-CM | POA: Diagnosis not present

## 2024-08-01 LAB — GLUCOSE, CAPILLARY
Glucose-Capillary: 115 mg/dL — ABNORMAL HIGH (ref 70–99)
Glucose-Capillary: 120 mg/dL — ABNORMAL HIGH (ref 70–99)
Glucose-Capillary: 166 mg/dL — ABNORMAL HIGH (ref 70–99)
Glucose-Capillary: 275 mg/dL — ABNORMAL HIGH (ref 70–99)

## 2024-08-01 LAB — BASIC METABOLIC PANEL WITH GFR
Anion gap: 10 (ref 5–15)
BUN: 14 mg/dL (ref 8–23)
CO2: 27 mmol/L (ref 22–32)
Calcium: 8.2 mg/dL — ABNORMAL LOW (ref 8.9–10.3)
Chloride: 99 mmol/L (ref 98–111)
Creatinine, Ser: 0.83 mg/dL (ref 0.61–1.24)
GFR, Estimated: >60 mL/min (ref >60)
Glucose, Bld: 121 mg/dL — ABNORMAL HIGH (ref 70–99)
Potassium: 3.7 mmol/L (ref 3.5–5.1)
Sodium: 136 mmol/L (ref 135–145)

## 2024-08-01 LAB — CBC
HCT: 24.7 % — ABNORMAL LOW (ref 39.0–52.0)
Hemoglobin: 7.7 g/dL — ABNORMAL LOW (ref 13.0–17.0)
MCH: 31.7 pg (ref 26.0–34.0)
MCHC: 31.2 g/dL (ref 30.0–36.0)
MCV: 101.6 fL — ABNORMAL HIGH (ref 80.0–100.0)
Platelets: 183 K/uL (ref 150–400)
RBC: 2.43 MIL/uL — ABNORMAL LOW (ref 4.22–5.81)
RDW: 18.3 % — ABNORMAL HIGH (ref 11.5–15.5)
WBC: 3.2 K/uL — ABNORMAL LOW (ref 4.0–10.5)
nRBC: 0 % (ref 0.0–0.2)

## 2024-08-01 MED ORDER — METHYLPREDNISOLONE SODIUM SUCC 125 MG IJ SOLR
60.0000 mg | Freq: Once | INTRAMUSCULAR | Status: AC
  Administered 2024-08-01: 60 mg via INTRAVENOUS
  Filled 2024-08-01: qty 2

## 2024-08-01 MED ORDER — ENSURE PLUS HIGH PROTEIN PO LIQD
237.0000 mL | Freq: Two times a day (BID) | ORAL | Status: DC
  Administered 2024-08-01 – 2024-08-02 (×2): 237 mL via ORAL

## 2024-08-01 MED ORDER — POLYETHYLENE GLYCOL 3350 17 G PO PACK
17.0000 g | PACK | Freq: Every day | ORAL | Status: DC
  Administered 2024-08-01 – 2024-08-02 (×2): 17 g via ORAL
  Filled 2024-08-01 (×2): qty 1

## 2024-08-01 MED ORDER — PREDNISONE 50 MG PO TABS
50.0000 mg | ORAL_TABLET | Freq: Every day | ORAL | Status: DC
  Administered 2024-08-02: 50 mg via ORAL
  Filled 2024-08-01: qty 1

## 2024-08-01 MED ORDER — LIDOCAINE 5 % EX PTCH
1.0000 | MEDICATED_PATCH | CUTANEOUS | Status: DC
  Administered 2024-08-01: 1 via TRANSDERMAL
  Filled 2024-08-01: qty 1

## 2024-08-01 MED ORDER — METHOCARBAMOL 500 MG PO TABS: 750.0000 mg | ORAL_TABLET | Freq: Four times a day (QID) | ORAL | Status: DC | PRN

## 2024-08-01 NOTE — Plan of Care (Signed)
   Problem: Activity: Goal: Risk for activity intolerance will decrease Outcome: Progressing   Problem: Elimination: Goal: Will not experience complications related to bowel motility Outcome: Progressing   Problem: Safety: Goal: Ability to remain free from injury will improve Outcome: Progressing   Problem: Skin Integrity: Goal: Risk for impaired skin integrity will decrease Outcome: Progressing

## 2024-08-01 NOTE — Progress Notes (Signed)
 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  He was admitted on 07/31/2024 with complaints of uncontrolled back pain.  Uncontrolled acute on chronic back pain - Patient has been on pain medications at home, however states home pain management regimen was not controlling his pain. - Rates pain today as 5 out of 10 - Started on steroids. - Continue pain regimen as ordered. 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  Treatments have included palliative RT and systemic chemotherapy. - He was seen at [facility] and treated with BiTE therapy which he received x 8 months.   -- However due to another progression of disease, he was started on Topotecan  + Cosela  on 12/24/2023.  - No plans for inpatient chemotherapy during this admission.   -- Patient will follow-up upon discharge with outpatient oncology for further evaluation and treatment planning. - Medical oncology/Dr. Sherrod following closely  Anemia Leukopenia - Hemoglobin low 7.7. - Recommend transfuse PRBC for Hgb <7.0 - WBC low 3.2.  No intervention required at this time - Continue to monitor CBC with differential  History of thrombocytopenia - Platelets normalized 183K. - Note very low platelets 42K on 8/11 - Continue to monitor closely  Hypertension Hyperlipidemia -- On antihypertensives, continue as ordered -- On statin -- Medicine managing    Code Status Full   Subjective:  Patient seen, awake and alert laying in bed.  Reports ongoing back pain which he rates as 5/10.  States he came in because of back pain was terrible.  Denies shortness of breath, GI bleeding, or other acute symptoms.  Admits to having bowel movement  last night.  No other acute complaints offered.  Objective:  No intake or output data in the 24 hours ending 08/01/24 1025   PHYSICAL EXAMINATION: ECOG PERFORMANCE STATUS: 3 - Symptomatic, >50% confined to bed  Vitals:   08/01/24 0718 08/01/24 0838  BP: 121/70 (!) 141/86  Pulse: 90 91  Resp: 16 15  Temp: 98.5 F (36.9 C) 98.1 F (36.7 C)  SpO2: 94% 94%   Filed Weights   07/31/24 1630  Weight: 240 lb (108.9 kg)    GENERAL: alert, no distress and comfortable SKIN: +Pale skin color, texture, turgor are normal, no rashes or significant lesions EYES: normal, conjunctiva are pink and non-injected, sclera clear OROPHARYNX: no exudate, no erythema and lips, buccal mucosa, and tongue normal  NECK: supple, thyroid  normal size, non-tender, without nodularity LYMPH: no palpable lymphadenopathy in the cervical, axillary or inguinal LUNGS: clear to auscultation and percussion with normal breathing effort HEART: regular rate & rhythm and no murmurs and no lower extremity edema ABDOMEN: abdomen soft, non-tender and normal bowel sounds MUSCULOSKELETAL: no cyanosis of digits and no clubbing  PSYCH: alert & oriented x 3 with fluent speech NEURO: no focal motor/sensory deficits   All questions were answered. The patient knows to call the clinic with any problems, questions or concerns.   The total time spent in the appointment was 40 minutes encounter with patient including review of chart and various tests results, discussions about plan of care and coordination of care plan  Olam JINNY Brunner, NP 08/01/2024 10:25 AM  Labs Reviewed:  Lab Results  Component Value Date   WBC 3.2 (L) 08/01/2024   HGB 7.7 (L) 08/01/2024   HCT 24.7 (L) 08/01/2024   MCV 101.6 (H) 08/01/2024   PLT 183 08/01/2024   Recent Labs    07/11/24 1059 07/18/24 1238 07/25/24 1244 07/29/24 1458 07/31/24 1709 08/01/24 0514  NA 138 137 138 138 138 136  K 4.0 4.6 4.9 3.9 4.4 3.7  CL 103 102 103 103 102 99  CO2  28 28 28 27 27 27   GLUCOSE 194* 153* 180* 148* 136* 121*  BUN 14 20 16 15 17 14   CREATININE 0.93 1.06 1.07 0.89 0.90 0.83  CALCIUM 8.1* 8.9 8.7* 8.4* 8.2* 8.2*  GFRNONAA >60 >60 >60 >60 >60 >60  PROT 5.8* 6.1* 5.8*  --   --   --   ALBUMIN 3.2* 3.4* 3.3*  --   --   --   AST 20 19 23   --   --   --   ALT 15 14 16   --   --   --   ALKPHOS 106 119 113  --   --   --   BILITOT 0.5 0.6 0.4  --   --   --     Studies Reviewed:  CT Angio Chest/Abd/Pel for Dissection W and/or Wo Contrast Result Date: 07/31/2024 CLINICAL DATA:  Chest pain back pain lung cancer EXAM: CT ANGIOGRAPHY CHEST, ABDOMEN AND PELVIS TECHNIQUE: Non-contrast CT of the chest was initially obtained. Multidetector CT imaging through the chest, abdomen and pelvis was performed using the standard protocol during bolus administration of intravenous contrast. Multiplanar reconstructed images and MIPs were obtained and reviewed to evaluate the vascular anatomy. RADIATION DOSE REDUCTION: This exam was performed according to the departmental dose-optimization program which includes automated exposure control, adjustment of the mA and/or kV according to patient size and/or use of iterative reconstruction technique. CONTRAST:  OMNIPAQUE  IOHEXOL  350 MG/ML SOLN COMPARISON:  Chest x-ray 07/31/2024, CT 06/09/2024, 03/08/2024, 05/19/2023, 05/01/2023, multiple exams dating back to 03/26/2020 FINDINGS: CTA CHEST FINDINGS Cardiovascular: Non contrasted images of the chest demonstrate no acute intramural hematoma. Mild aortic atherosclerosis. No dissection is seen. Ectatic ascending aorta up to 3.8 cm. Coronary vascular calcification. Cardiomegaly. Trace pericardial effusion. Right-sided central venous port with tip in the right atrium. Mediastinum/Nodes: Patent trachea. No thyroid  mass. Progressive soft tissue density within the mediastinum, most evident in the subcarinal region, series 11 image 56 and surrounding the right bronchus, series 11 image 50  through 58. Increased right hilar soft tissue density compared to prior. Esophagus within normal limits. Lungs/Pleura: Volume loss right hemithorax as before. Suspect slight increased pleural thickening compared to prior. Right Peri hilar distortion and bronchiectasis corresponding to post radiation change but progressive confluent and more masslike consolidation with decreased air bronchograms compared to prior and raising concern for tumor recurrence. Overall consolidative process is increased compared to the prior CT exams. Underlying mild fibrotic change at the right middle lobe but with interval increase in interstitial and ground-glass density. New irregular focus of airspace disease and ground-glass density in the anterior right base, series 7, image 93 measuring 10 mm. This is contiguous with irregular airspace disease in the anterior right lung base, series 7, image 97, with surrounding ground-glass. Within the posterior, medial right base, there is new heterogeneous ground-glass density and multiple small irregular nodular densities, for example 9 mm density on series 7, image 108 and adjacent 6 mm densities on series 7, image 108 and  107. Chronic mild hyperdense material in the right posterior pleural space. Musculoskeletal: Sternum appears intact. Multilevel degenerative changes. No definite acute osseous abnormality is seen. Review of the MIP images confirms the above findings. CTA ABDOMEN AND PELVIS FINDINGS VASCULAR Aorta: Normal caliber aorta without aneurysm, dissection, vasculitis or significant stenosis. Moderate aortic atherosclerosis. Celiac: Patent without evidence of aneurysm, dissection, vasculitis or significant stenosis. SMA: Patent without evidence of aneurysm, dissection, vasculitis or significant stenosis. Renals: Both renal arteries are patent without evidence of aneurysm, dissection, vasculitis, fibromuscular dysplasia or significant stenosis. IMA: Patent without evidence of aneurysm,  dissection, vasculitis or significant stenosis. Inflow: Patent without evidence of aneurysm, dissection, vasculitis or significant stenosis. Veins: Suboptimally assessed Review of the MIP images confirms the above findings. NON-VASCULAR Hepatobiliary: Nodular hepatic contour suggests possible cirrhosis change. Hepatic metastatic disease. Right hepatic dome lesion measures about 6.8 x 4.6 cm, previously 5.8 x 4.7 cm but was better seen on venous phase imaging. Vague caudate lesion measures 2.1 cm on series 11, image 123, 17 mm on the prior exam. No calcified gallstone or biliary dilatation. Fiducial markers in the inferior right hepatic lobe, adjacent to hypodense metastatic focus, this lesion measures 19 mm on series 11, image 123, previously 14 mm. Pancreas: Atrophic.  No acute inflammation Spleen: Heterogenous enhancement likely due to arterial phase. Adrenals/Urinary Tract: Stable negative left adrenal gland. Stable 15 mm right adrenal nodule. The kidneys show no hydronephrosis. Right renal cysts for which no imaging follow-up is recommended. The bladder is unremarkable. Stomach/Bowel: The stomach is decompressed but suspect mild diffuse gastric wall thickening. There is no dilated small bowel. No acute bowel wall thickening. Lymphatic: Multiple enlarged peripancreatic and porta hepatis nodes. Node adjacent to the proximal pancreas measures 31 mm on series 11, image 30, previously 21 mm. Node adjacent to the inferior pancreatic head measures 36 mm on series 11, image 146, previously 26 mm. Reproductive: Negative prostate Other: Negative for pelvic effusion or free air Musculoskeletal: No acute osseous abnormality. No suspicious bone lesion. Advanced disc space narrowing at L4-L5 with moderate severe canal stenosis due to combination of disc disease and posterior ligamentum flavum and facet disease. Disc space narrowing at L3-L4 with gas locules in the right foramen. Severe canal stenosis suspected likely due to  disc disease and facet hypertrophy. 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 Review of the MIP images confirms the above findings. IMPRESSION: 1. Negative for acute aortic dissection or aneurysm. 2. Post therapeutic changes in the right hilar/suprahilar lung but with interim progressive masslike consolidative process in the right perihilar region with decreased air bronchograms compared to prior, raising concern for tumor recurrence. Progressive soft tissue density within the mediastinum concerning for tumor recurrence/metastatic disease. 3. New irregular foci of airspace disease/nodularity and ground-glass density in the right middle lobe and right lung base as described above, indeterminate for tumor versus infectious or inflammatory process. 4. Hepatic metastatic disease with slight interval increase in size of hepatic lesions 5. Increase in size of peripancreatic and porta hepatis lymph nodes consistent with disease progression. 6. Advanced degenerative changes of the lumbar spine with suspected moderate to severe canal stenosis at L3-L4 and L4-L5. These findings would be better assessed with MRI Aortic Atherosclerosis (ICD10-I70.0). Electronically Signed   By: Luke Bun M.D.   On: 07/31/2024 18:48   DG Chest Portable 1 View Result Date: 07/31/2024 CLINICAL DATA:  Chest pain EXAM: PORTABLE CHEST 1 VIEW COMPARISON:  01/04/2024, CT 06/09/2024, radiograph 08/10/2023, 09/18/2022 FINDINGS: Right-sided central venous port with tip at the  cavoatrial region. Volume loss right thorax with distortion and masslike consolidation in the right hilar/suprahilar lung appears slightly more confluent compared to the chest x-ray from January. Pleural thickening and or fluid not significantly changed. Stable enlarged cardiomediastinal silhouette. No pneumothorax IMPRESSION: Volume loss right thorax with distortion and masslike consolidation in the right hilar/suprahilar lung appears slightly more confluent compared to the chest x-ray from  January but probably stable compared with scout image from CT in June and likely related to post treatment changes. Contrast-enhanced chest CT follow-up if indicated. Similar right pleural fluid and or thickening. Cardiomegaly Electronically Signed   By: Luke Bun M.D.   On: 07/31/2024 17:30

## 2024-08-01 NOTE — Progress Notes (Signed)
 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  Has chronic back pain with known severe lumbar spinal stenosis.  Back pain suddenly worsened.  On presentation, he was hemodynamically stable.  CT chest/abdomen/pelvis was negative for acute aortic dissection or aneurysm, changes concerning for lung cancer recurrence/metastatic disease, increasing size of hepatic metastatic lesions, increase in size of lymph nodes, advanced degenerative changes of the lumbar spine with suspected mild to severe canal stenosis at L3-L4 and L4-L5.  Patient admitted for the management of intractable back pain.  PT consulted  Assessment & Plan:  Principal Problem:   Acute on chronic back pain Active Problems:   Essential hypertension   Type 2 diabetes mellitus (HCC)   OSA (obstructive sleep apnea)   Hyperlipidemia associated with type 2 diabetes mellitus (HCC)   Small cell lung cancer, right upper lobe (HCC)   History of pulmonary embolism   COPD (chronic obstructive pulmonary disease) (HCC)   Atypical chest pain  Intractable back pain/acute on chronic back pain/severe lumbar spinal stenosis: Presented with uncontrolled acute on chronic back pain.  Imaging findings as above.  No focal weakness , no new numbness/tingling, or loss of bowel/bladder control. His home pain regimen has not been able to adequately control his pain .  Currently on MS Contin , Percocet, Dilaudid , Flexeril .  PT consulted Will also start him on steroids.  Continue bowel regimen  Abnormal EKG: EKG showed Mobitz type II AV block.  Case discussed with  cardiology, thought to be likely first-degree AV block rather than Mobitz type II.  Troponins negative.  Metastatic small cell lung cancer: Follows with Dr. Sherrod.  On active treatment with chemotherapy.  Imaging concerning for progression of tumor burden.  Message sent to Dr. Sherrod  COPD: Currently without exacerbation.  Continue bronchodilators as needed  History of PE: On Eliquis   Diabetes type 2: Recent A1c of 6.7.  Takes metformin  at home.  Currently on sliding scale  Hypertension: On losartan   Chronic normocytic anemia: Currently hemoglobin stable in the range of 8  Hyperlipidemia: On statin         DVT prophylaxis:apixaban  (ELIQUIS ) tablet 5 mg Start: 07/31/24 2315 apixaban  (ELIQUIS ) tablet 5 mg     Code Status: Full Code  Family Communication: None at bedside  Patient status:Obs  Patient is from :Home  Anticipated discharge un:ynfz  Estimated DC date:2-3 days   Consultants: None  Procedures:None  Antimicrobials:  Anti-infectives (From admission, onward)    None       Subjective: Patient seen and examined at bedside today.  He was lying in bed.  Hemodynamically stable.  Complains of severe low back pain.  Rates as 8/10.  He looks slightly sedated from pain medication but was overall alert and oriented.  No report of nausea, vomiting, chest pain.  We discussed about starting on the steroids for his back pain  Objective: Vitals:   08/01/24 0010 08/01/24 0325 08/01/24 0718 08/01/24 0838  BP:  131/76 121/70 (!) 141/86  Pulse:  90 90 91  Resp: (!) 7 17 16 15   Temp:  98.9 F (37.2 C) 98.5 F (36.9 C) 98.1 F (36.7  C)  TempSrc:  Oral Oral Oral  SpO2:  99% 94% 94%  Weight:      Height:       No intake or output data in the 24 hours ending 08/01/24 1137 Filed Weights   07/31/24 1630  Weight: 108.9 kg    Examination:  General exam: Overall comfortable, not in distress,obese HEENT: PERRL Respiratory system:  no wheezes or crackles   Cardiovascular system: S1 & S2 heard, RRR.  Gastrointestinal system: Abdomen is nondistended, soft and nontender. Central nervous system: Alert and oriented Extremities: No edema, no clubbing ,no cyanosis Skin: No rashes, no ulcers,no icterus     Data Reviewed: I have personally reviewed following labs and imaging studies  CBC: Recent Labs  Lab 07/25/24 1244 07/29/24 1458 07/31/24 1709 08/01/24 0514  WBC 3.0* 3.0* 4.6 3.2*  NEUTROABS 2.2 2.4  --   --   HGB 7.8* 8.9* 8.5* 7.7*  HCT 24.5* 28.6* 28.1* 24.7*  MCV 97.6 100.0 102.2* 101.6*  PLT 42* 128* 200 183   Basic Metabolic Panel: Recent Labs  Lab 07/25/24 1244 07/29/24 1458 07/31/24 1709 08/01/24 0514  NA 138 138 138 136  K 4.9 3.9 4.4 3.7  CL 103 103 102 99  CO2 28 27 27 27   GLUCOSE 180* 148* 136* 121*  BUN 16 15 17 14   CREATININE 1.07 0.89 0.90 0.83  CALCIUM 8.7* 8.4* 8.2* 8.2*     No results found for this or any previous visit (from the past 240 hours).   Radiology Studies: CT Angio Chest/Abd/Pel for Dissection W and/or Wo Contrast Result Date: 07/31/2024 CLINICAL DATA:  Chest pain back pain lung cancer EXAM: CT ANGIOGRAPHY CHEST, ABDOMEN AND PELVIS TECHNIQUE: Non-contrast CT of the chest was initially obtained. Multidetector CT imaging through the chest, abdomen and pelvis was performed using the standard protocol during bolus administration of intravenous contrast. Multiplanar reconstructed images and MIPs were obtained and reviewed to evaluate the vascular anatomy. RADIATION DOSE REDUCTION: This exam was performed according to the departmental dose-optimization program which includes automated exposure control, adjustment of the mA and/or kV according to patient size and/or use of iterative reconstruction technique. CONTRAST:  OMNIPAQUE  IOHEXOL  350 MG/ML SOLN COMPARISON:  Chest x-ray 07/31/2024, CT 06/09/2024, 03/08/2024, 05/19/2023, 05/01/2023, multiple exams dating back to 03/26/2020 FINDINGS: CTA CHEST  FINDINGS Cardiovascular: Non contrasted images of the chest demonstrate no acute intramural hematoma. Mild aortic atherosclerosis. No dissection is seen. Ectatic ascending aorta up to 3.8 cm. Coronary vascular calcification. Cardiomegaly. Trace pericardial effusion. Right-sided central venous port with tip in the right atrium. Mediastinum/Nodes: Patent trachea. No thyroid  mass. Progressive soft tissue density within the mediastinum, most evident in the subcarinal region, series 11 image 56 and surrounding the right bronchus, series 11 image 50 through 58. Increased right hilar soft tissue density compared to prior. Esophagus within normal limits. Lungs/Pleura: Volume loss right hemithorax as before. Suspect slight increased pleural thickening compared to prior. Right Peri hilar distortion and bronchiectasis corresponding to post radiation change but progressive confluent and more masslike consolidation with decreased air bronchograms compared to prior and raising concern for tumor recurrence. Overall consolidative process is increased compared to the prior CT exams. Underlying mild fibrotic change at the right middle lobe but with interval increase in interstitial and ground-glass density. New irregular focus of airspace disease and ground-glass density in the anterior right base, series 7, image 93 measuring 10 mm. This is contiguous with irregular airspace disease in the anterior right lung base, series 7, image  97, with surrounding ground-glass. Within the posterior, medial right base, there is new heterogeneous ground-glass density and multiple small irregular nodular densities, for example 9 mm density on series 7, image 108 and adjacent 6 mm densities on series 7, image 108 and 107. Chronic mild hyperdense material in the right posterior pleural space. Musculoskeletal: Sternum appears intact. Multilevel degenerative changes. No definite acute osseous abnormality is seen. Review of the MIP images confirms the  above findings. CTA ABDOMEN AND PELVIS FINDINGS VASCULAR Aorta: Normal caliber aorta without aneurysm, dissection, vasculitis or significant stenosis. Moderate aortic atherosclerosis. Celiac: Patent without evidence of aneurysm, dissection, vasculitis or significant stenosis. SMA: Patent without evidence of aneurysm, dissection, vasculitis or significant stenosis. Renals: Both renal arteries are patent without evidence of aneurysm, dissection, vasculitis, fibromuscular dysplasia or significant stenosis. IMA: Patent without evidence of aneurysm, dissection, vasculitis or significant stenosis. Inflow: Patent without evidence of aneurysm, dissection, vasculitis or significant stenosis. Veins: Suboptimally assessed Review of the MIP images confirms the above findings. NON-VASCULAR Hepatobiliary: Nodular hepatic contour suggests possible cirrhosis change. Hepatic metastatic disease. Right hepatic dome lesion measures about 6.8 x 4.6 cm, previously 5.8 x 4.7 cm but was better seen on venous phase imaging. Vague caudate lesion measures 2.1 cm on series 11, image 123, 17 mm on the prior exam. No calcified gallstone or biliary dilatation. Fiducial markers in the inferior right hepatic lobe, adjacent to hypodense metastatic focus, this lesion measures 19 mm on series 11, image 123, previously 14 mm. Pancreas: Atrophic.  No acute inflammation Spleen: Heterogenous enhancement likely due to arterial phase. Adrenals/Urinary Tract: Stable negative left adrenal gland. Stable 15 mm right adrenal nodule. The kidneys show no hydronephrosis. Right renal cysts for which no imaging follow-up is recommended. The bladder is unremarkable. Stomach/Bowel: The stomach is decompressed but suspect mild diffuse gastric wall thickening. There is no dilated small bowel. No acute bowel wall thickening. Lymphatic: Multiple enlarged peripancreatic and porta hepatis nodes. Node adjacent to the proximal pancreas measures 31 mm on series 11, image 30,  previously 21 mm. Node adjacent to the inferior pancreatic head measures 36 mm on series 11, image 146, previously 26 mm. Reproductive: Negative prostate Other: Negative for pelvic effusion or free air Musculoskeletal: No acute osseous abnormality. No suspicious bone lesion. Advanced disc space narrowing at L4-L5 with moderate severe canal stenosis due to combination of disc disease and posterior ligamentum flavum and facet disease. Disc space narrowing at L3-L4 with gas locules in the right foramen. Severe canal stenosis suspected likely due to disc disease and facet hypertrophy. 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 Review of the MIP images confirms the above findings. IMPRESSION: 1. Negative for acute aortic dissection or aneurysm. 2. Post therapeutic changes in the right hilar/suprahilar lung but with interim progressive masslike consolidative process in the right perihilar region with decreased air bronchograms compared to prior, raising concern for tumor recurrence. Progressive soft tissue density within the mediastinum concerning for tumor recurrence/metastatic disease. 3. New irregular foci of airspace disease/nodularity and ground-glass density in the right middle lobe and right lung base as described above, indeterminate for tumor versus infectious or inflammatory process. 4. Hepatic metastatic disease with slight interval increase in size of hepatic lesions 5. Increase in size of peripancreatic and porta hepatis lymph nodes consistent with disease progression. 6. Advanced degenerative changes of the lumbar spine with suspected moderate to severe canal stenosis at L3-L4 and L4-L5. These findings would be better assessed with MRI Aortic Atherosclerosis (ICD10-I70.0). Electronically Signed   By: Luke Bun  M.D.   On: 07/31/2024 18:48   DG Chest Portable 1 View Result Date: 07/31/2024 CLINICAL DATA:  Chest pain EXAM: PORTABLE CHEST 1 VIEW COMPARISON:  01/04/2024, CT 06/09/2024, radiograph 08/10/2023, 09/18/2022  FINDINGS: Right-sided central venous port with tip at the cavoatrial region. Volume loss right thorax with distortion and masslike consolidation in the right hilar/suprahilar lung appears slightly more confluent compared to the chest x-ray from January. Pleural thickening and or fluid not significantly changed. Stable enlarged cardiomediastinal silhouette. No pneumothorax IMPRESSION: Volume loss right thorax with distortion and masslike consolidation in the right hilar/suprahilar lung appears slightly more confluent compared to the chest x-ray from January but probably stable compared with scout image from CT in June and likely related to post treatment changes. Contrast-enhanced chest CT follow-up if indicated. Similar right pleural fluid and or thickening. Cardiomegaly Electronically Signed   By: Luke Bun M.D.   On: 07/31/2024 17:30    Scheduled Meds:  apixaban   5 mg Oral BID   gabapentin   300 mg Oral BID   insulin  aspart  0-9 Units Subcutaneous TID WC   losartan   50 mg Oral Daily   mirtazapine   15 mg Oral QHS   morphine   30 mg Oral Q8H   pravastatin   40 mg Oral q1800   senna  1 tablet Oral BID   sodium chloride  flush  3 mL Intravenous Q12H   Continuous Infusions:   LOS: 0 days   Ivonne Mustache, MD [facility] P8/18/2025, 11:37 AM

## 2024-08-01 NOTE — Care Management Obs Status (Signed)
 MEDICARE OBSERVATION STATUS NOTIFICATION   Patient Details  Name: Adam Wise MRN: [id number] Date of Birth: 1951-10-23   Medicare Observation Status Notification Given:  Yes    Toy LITTIE Agar, RN 08/01/2024, 3:54 PM

## 2024-08-01 NOTE — Progress Notes (Signed)
   08/01/24 1624  TOC Brief Assessment  Insurance and Status Reviewed  Patient has primary care physician No  Home environment has been reviewed From home with wife  Prior level of function: Independent woth cane  Prior/Current Home Services No current home services  Social Drivers of Health Review SDOH reviewed no interventions necessary  Readmission risk has been reviewed Yes  Transition of care needs no transition of care needs at this time

## 2024-08-01 NOTE — Progress Notes (Signed)
 PT Cancellation Note  Patient Details Name: Adam Wise MRN: [id number] DOB: 13-Jul-1951   Cancelled Treatment:    Reason Eval/Treat Not Completed: Patient declined, no reason specified. Pt resting in sidelying upon therapist's arrival, easily aroused. Pt able to state name and DOB, states here to location, states no clue to current date or president. Pt adamant he doesn't want physical therapy, declines multiple times stating I don't want no physical therapy, but still unable to state year, month or being in hospital. Nurse aware, will continue to follow for PT eval as schedule permits.    Tori Darah Simkin PT, DPT 08/01/24, 11:35 AM

## 2024-08-01 NOTE — Plan of Care (Signed)
  Problem: Education: Goal: Knowledge of General Education information will improve Description: Including pain rating scale, medication(s)/side effects and non-pharmacologic comfort measures Outcome: Progressing   Problem: Health Behavior/Discharge Planning: Goal: Ability to manage health-related needs will improve Outcome: Progressing   Problem: Clinical Measurements: Goal: Ability to maintain clinical measurements within normal limits will improve Outcome: Progressing Goal: Will remain free from infection Outcome: Progressing Goal: Diagnostic test results will improve Outcome: Progressing Goal: Respiratory complications will improve Outcome: Progressing Goal: Cardiovascular complication will be avoided Outcome: Progressing   Problem: Activity: Goal: Risk for activity intolerance will decrease Outcome: Progressing   Problem: Coping: Goal: Level of anxiety will decrease Outcome: Progressing   Problem: Elimination: Goal: Will not experience complications related to bowel motility Outcome: Progressing Goal: Will not experience complications related to urinary retention Outcome: Progressing   Problem: Pain Managment: Goal: General experience of comfort will improve and/or be controlled Outcome: Progressing   Problem: Safety: Goal: Ability to remain free from injury will improve Outcome: Progressing   Problem: Skin Integrity: Goal: Risk for impaired skin integrity will decrease Outcome: Progressing   Problem: Education: Goal: Ability to describe self-care measures that may prevent or decrease complications (Diabetes Survival Skills Education) will improve Outcome: Progressing Goal: Individualized Educational Video(s) Outcome: Progressing   Problem: Coping: Goal: Ability to adjust to condition or change in health will improve Outcome: Progressing   Problem: Fluid Volume: Goal: Ability to maintain a balanced intake and output will improve Outcome: Progressing    Problem: Health Behavior/Discharge Planning: Goal: Ability to identify and utilize available resources and services will improve Outcome: Progressing Goal: Ability to manage health-related needs will improve Outcome: Progressing   Problem: Metabolic: Goal: Ability to maintain appropriate glucose levels will improve Outcome: Progressing   Problem: Nutritional: Goal: Maintenance of adequate nutrition will improve Outcome: Progressing Goal: Progress toward achieving an optimal weight will improve Outcome: Progressing   Problem: Skin Integrity: Goal: Risk for impaired skin integrity will decrease Outcome: Progressing   Problem: Tissue Perfusion: Goal: Adequacy of tissue perfusion will improve Outcome: Progressing

## 2024-08-01 NOTE — Progress Notes (Signed)
   07/31/24 2350  BiPAP/CPAP/SIPAP  $ Non-Invasive Home Ventilator  Initial  $ Face Mask Large  Yes  BiPAP/CPAP/SIPAP Pt Type Adult  BiPAP/CPAP/SIPAP Resmed  Mask Type Full face mask  Dentures removed? Not applicable  Mask Size Large  EPAP 10 cmH2O (home setting per pt)  Flow Rate 2 lpm  Patient Home Machine No  Patient Home Mask No  Patient Home Tubing No  Auto Titrate No  CPAP/SIPAP surface wiped down Yes  Device Plugged into RED Power Outlet Yes  BiPAP/CPAP /SiPAP Vitals  Resp (!) 22  MEWS Score/Color  MEWS Score 2  MEWS Score Color Yellow

## 2024-08-01 NOTE — Progress Notes (Signed)
 When going to ask pt to put tele monitor back on pt stated I am leaving, I am not staying another night in this place This nurse educated pt and pt's wife about the benefits of staying and being compliant with what the doctor has ordered. Wife agreed, pt states he is not going to work with physical therapy which I then informed him that the bed alarm will stay on. I also educated pt about tele monitor and why is it necessary for the pt to continue wearing. PT is now  getting dry clothes and sheets and has agreed to stay. Pt informed on our leaving AMA policy. Stated that he understood.  MD Jillian Buttery Notified .

## 2024-08-02 ENCOUNTER — Inpatient Hospital Stay

## 2024-08-02 ENCOUNTER — Inpatient Hospital Stay: Payer: PRIVATE HEALTH INSURANCE

## 2024-08-02 ENCOUNTER — Other Ambulatory Visit (HOSPITAL_COMMUNITY): Payer: Self-pay

## 2024-08-02 DIAGNOSIS — G8929 Other chronic pain: Secondary | ICD-10-CM | POA: Diagnosis not present

## 2024-08-02 DIAGNOSIS — M549 Dorsalgia, unspecified: Secondary | ICD-10-CM | POA: Diagnosis not present

## 2024-08-02 LAB — CBC
HCT: 26.7 % — ABNORMAL LOW (ref 39.0–52.0)
Hemoglobin: 8.5 g/dL — ABNORMAL LOW (ref 13.0–17.0)
MCH: 31.8 pg (ref 26.0–34.0)
MCHC: 31.8 g/dL (ref 30.0–36.0)
MCV: 100 fL (ref 80.0–100.0)
Platelets: 211 K/uL (ref 150–400)
RBC: 2.67 MIL/uL — ABNORMAL LOW (ref 4.22–5.81)
RDW: 17.8 % — ABNORMAL HIGH (ref 11.5–15.5)
WBC: 3.6 K/uL — ABNORMAL LOW (ref 4.0–10.5)
nRBC: 0 % (ref 0.0–0.2)

## 2024-08-02 LAB — GLUCOSE, CAPILLARY
Glucose-Capillary: 139 mg/dL — ABNORMAL HIGH (ref 70–99)
Glucose-Capillary: 156 mg/dL — ABNORMAL HIGH (ref 70–99)

## 2024-08-02 MED ORDER — PREDNISONE 50 MG PO TABS
50.0000 mg | ORAL_TABLET | Freq: Every day | ORAL | 0 refills | Status: AC
  Filled 2024-08-02: qty 4, 4d supply, fill #0

## 2024-08-02 NOTE — Evaluation (Signed)
 Physical Therapy Evaluation Only Patient Details Name: Adam Wise MRN: [id number] DOB: 21-Jan-1951 Today's Date: 08/02/2024  History of Present Illness  Lorraine Terriquez is a 73 y.o. male presents with back pain. 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 Pt reports ind with self care, spouse completes household chores, drives occasionally. Pt reports success with OPPT in the past has varied, he declines OPPT referral at this time, reports he is established at a clinic and will call to return if he feels it is necessary later. On eval, pt is ind-modified ind with all mobility, using SPC for amb up to 250 ft, no overt LOB, denies dizziness, denies pain increase. Pt reports goal is to go home today ASAP and spouse at bedside in agreement. No acute PT needs identified and no f/u recommendations at this time. Will sign off.      If plan is discharge home, recommend the following:     Can travel by private vehicle        Equipment Recommendations None recommended by PT  Recommendations for Other Services       Functional Status Assessment Patient has not had a recent decline in their functional status     Precautions / Restrictions Restrictions Weight Bearing Restrictions Per Provider Order: No      Mobility  Bed Mobility Overal bed mobility: Independent                  Transfers Overall transfer level: Modified independent Equipment used: Straight cane                    Ambulation/Gait Ambulation/Gait assistance: Modified independent (Device/Increase time) Gait Distance (Feet): 250 Feet Assistive device: Straight cane Gait Pattern/deviations: WFL(Within Functional Limits) Gait velocity: functional, slightly decreased        Stairs             Wheelchair Mobility     Tilt Bed    Modified Rankin (Stroke Patients Only)       Balance Overall balance assessment: No apparent balance deficits (not formally assessed)                                           Pertinent Vitals/Pain Pain Assessment Pain Assessment: 0-10 Pain Score: 4  Pain Location: low back Pain Descriptors / Indicators: Aching Pain Intervention(s): Limited activity within patient's tolerance, Monitored during session, Repositioned    Home Living Family/patient expects to be discharged to:: Private residence Living Arrangements: Spouse/significant other Available Help at Discharge: Family;Available 24 hours/day Type of Home: House Home Access: Stairs to enter Entrance Stairs-Rails: Right Entrance Stairs-Number of Steps: 5 Alternate Level Stairs-Number of Steps: chair lift Home Layout: Two level;Able to live on main level with bedroom/bathroom Home Equipment: Rollator (4 wheels);Cane - single Librarian, academic (2 wheels);Shower seat;Grab bars - tub/shower;Wheelchair - manual;Grab bars - toilet (upright walker)      Prior Function Prior Level of Function : Independent/Modified Independent;Driving             Mobility Comments: pt reports using rollator in the home mostly and occasionally SPC, pt reports SPC in community ADLs Comments: pt reports ind with self care, spouse  completing household chores     Extremity/Trunk Assessment   Upper Extremity Assessment Upper Extremity Assessment: Overall WFL for tasks assessed    Lower Extremity Assessment Lower Extremity Assessment: Overall WFL for tasks assessed (AROM WFL, denies numbness/tingling throughout BLE, denies saddle paresthesia, denies loss of bowel/bladder control, symmetrical BLE)    Cervical / Trunk Assessment Cervical / Trunk Assessment: Kyphotic  Communication   Communication Communication: No apparent difficulties    Cognition Arousal: Alert Behavior  During Therapy: WFL for tasks assessed/performed   PT - Cognitive impairments: No apparent impairments                         Following commands: Intact       Cueing       General Comments      Exercises     Assessment/Plan    PT Assessment Patient does not need any further PT services  PT Problem List         PT Treatment Interventions      PT Goals (Current goals can be found in the Care Plan section)  Acute Rehab PT Goals Patient Stated Goal: return home PT Goal Formulation: All assessment and education complete, DC therapy    Frequency       Co-evaluation               AM-PAC PT 6 Clicks Mobility  Outcome Measure Help needed turning from your back to your side while in a flat bed without using bedrails?: None Help needed moving from lying on your back to sitting on the side of a flat bed without using bedrails?: None Help needed moving to and from a bed to a chair (including a wheelchair)?: None Help needed standing up from a chair using your arms (e.g., wheelchair or bedside chair)?: None Help needed to walk in hospital room?: None Help needed climbing 3-5 steps with a railing? : None 6 Click Score: 24    End of Session Equipment Utilized During Treatment: Gait belt Activity Tolerance: Patient tolerated treatment well Patient left: in bed;with call bell/phone within reach;with family/visitor present Nurse Communication: Mobility status      Time: 8854-8794 PT Time Calculation (min) (ACUTE ONLY): 20 min   Charges:   PT Evaluation $PT Eval Low Complexity: 1 Low   PT General Charges $$ ACUTE PT VISIT: 1 Visit         Tori Nishita Isaacks PT, DPT 08/02/24, 12:14 PM

## 2024-08-02 NOTE — Progress Notes (Signed)
   08/02/24 0015  BiPAP/CPAP/SIPAP  BiPAP/CPAP/SIPAP Pt Type Adult  BiPAP/CPAP/SIPAP Resmed  Patient Home Machine Yes  Safety Check Completed by RT for Home Unit Yes, no issues noted  Patient Home Mask Yes  Patient Home Tubing Yes  Auto Titrate  (home settings)  CPAP/SIPAP surface wiped down Yes  BiPAP/CPAP /SiPAP Vitals  Resp (!) 7  MEWS Score/Color  MEWS Score 3  MEWS Score Color Yellow   Patient switched to home unit and the hospital unit was pulled

## 2024-08-02 NOTE — Plan of Care (Addendum)
 Pt's HS blood glucose was 270. Pt had no HS coverage. Hospital was notified, however, pt stated he didn't want any insulin  no none was ordered. Otherwise nigh was uneventful. Pt reported pain is controlled,  no PRN pain medications requested this shift. Bed alarm alarmed and activated, call light within reach.   Problem: Nutrition: Goal: Adequate nutrition will be maintained Outcome: Progressing   Problem: Pain Managment: Goal: General experience of comfort will improve and/or be controlled Outcome: Progressing   Problem: Safety: Goal: Ability to remain free from injury will improve Outcome: Progressing   Problem: Skin Integrity: Goal: Risk for impaired skin integrity will decrease Outcome: Progressing

## 2024-08-02 NOTE — Discharge Summary (Signed)
 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  Has chronic back pain with known severe lumbar spinal stenosis.  Back pain suddenly worsened.  On presentation, he was hemodynamically stable.  CT chest/abdomen/pelvis was negative for acute aortic dissection or aneurysm, changes concerning for lung cancer recurrence/metastatic disease, increasing size of hepatic metastatic lesions, increase in size of lymph nodes, advanced degenerative changes of the lumbar spine with suspected mild to severe canal stenosis at L3-L4 and L4-L5.  Patient admitted for the management of intractable back pain.  Started on steroids with significant improvement.  He feels much better today.  No significant back pain.  PT recommended no follow-up.  Medically stable for discharge home today  Following problems were addressed during the hospitalization:  Intractable back pain/acute on chronic back pain/severe lumbar spinal stenosis: Presented with uncontrolled acute on chronic back pain.  Imaging findings as above.  No focal weakness , no new numbness/tingling, or loss of bowel/bladder control.  Significant improvement in the back pain with steroids.  Continue home pain regimen.  He is planning to see pain management provider as an outpatient.   Abnormal EKG: EKG showed Mobitz type II AV block.  Case discussed with cardiology, thought to be likely  first-degree AV block rather than Mobitz type II.  Troponins negative.   Metastatic small cell lung cancer: Follows with Dr. Sherrod.  On active treatment with chemotherapy.  Imaging concerning for progression of tumor burden.  He needs to follow-up with Dr. Sherrod as an outpatient   COPD: Currently without exacerbation.  Continue bronchodilators as needed   History of PE: On Eliquis    Diabetes type 2: Recent A1c of 6.7.  Takes metformin  at home.  Continue monitoring blood sugars at home  Hypertension: On losartan    Chronic normocytic anemia: Currently hemoglobin stable in the range of 8   Hyperlipidemia: On statin       Discharge Diagnoses:  Principal Problem:   Acute on chronic back pain Active Problems:   Essential hypertension   Type 2 diabetes mellitus (HCC)   OSA (obstructive sleep apnea)   Hyperlipidemia associated with type 2 diabetes mellitus (HCC)   Small cell lung cancer, right upper lobe (HCC)   History of pulmonary embolism   COPD (chronic obstructive pulmonary disease) (HCC)   Atypical chest pain    Discharge Instructions  Discharge Instructions     Diet Carb Modified   Complete by: As directed    Discharge instructions   Complete by: As directed    1)Please take your  medications as instructed 2)Monitor your blood sugars at home 3)Follow up with your PCP in a week.  Follow-up with pain management for your chronic back pain   Increase activity slowly   Complete by: As directed       Allergies as of 08/02/2024       Reactions   Bupropion Other (See Comments)   Racing heart   Hydrochlorothiazide Other (See Comments)   Cramping to lower extremities  Medication List     STOP taking these medications    nystatin  100000 UNIT/ML suspension Commonly known as: MYCOSTATIN        TAKE these medications    albuterol  108 (90 Base) MCG/ACT inhaler Commonly known as: VENTOLIN  HFA Inhale 2 puffs into the lungs every 4 (four) hours as  needed for shortness of breath. What changed:  when to take this reasons to take this   B-D 3CC LUER-LOK SYR 22GX1 22G X 1 3 ML Misc Generic drug: SYRINGE-NEEDLE (DISP) 3 ML USE AS INSTRUCTED FOR TESTOSTERONE  INJECTION EVERY 2 WEEKS   BD Pen Needle Nano U/F 32G X 4 MM Misc Generic drug: Insulin  Pen Needle Use with insulin  as prescribed Dx Code: E11.9   CoQ10 200 MG Caps Take 200 mg by mouth daily.   cyclobenzaprine  10 MG tablet Commonly known as: FLEXERIL  Take 1 tablet (10 mg total) by mouth 2 (two) times daily as needed for muscle spasms.   docusate sodium  100 MG capsule Commonly known as: COLACE Take 1 capsule (100 mg total) by mouth every 12 (twelve) hours. What changed:  when to take this reasons to take this   Eliquis  5 MG Tabs tablet Generic drug: apixaban  TAKE 1 TABLET BY MOUTH TWICE A DAY   esomeprazole 20 MG capsule Commonly known as: NEXIUM Take 20 mg by mouth in the morning and at bedtime.   gabapentin  300 MG capsule Commonly known as: NEURONTIN  Take 1 capsule (300 mg total) by mouth 2 (two) times daily. For back pain.   ipratropium-albuterol  0.5-2.5 (3) MG/3ML Soln Commonly known as: DUONEB Take 3 mLs by nebulization every 4 (four) hours while awake for 3 days, THEN 3 mLs every 4 (four) hours as needed (shortness of breath or wheezing). Start taking on: January 07, 2024 What changed: See the new instructions.   KRILL OIL PO Take 1 capsule by mouth daily.   Lancets Misc USE UP TO 3 TIMES DAILY AS DIRECTED   lidocaine  5 % Commonly known as: Lidoderm  Place 1 patch onto the skin daily. Remove & Discard patch within 12 hours or as directed by MD What changed:  when to take this reasons to take this additional instructions   losartan  50 MG tablet Commonly known as: COZAAR  TAKE 1 TABLET (50 MG TOTAL) BY MOUTH DAILY. FOR BLOOD PRESSURE.   metFORMIN  1000 MG tablet Commonly known as: GLUCOPHAGE  TAKE 1 TABLET (1,000 MG TOTAL) BY MOUTH 2 (TWO) TIMES  DAILY WITH A MEAL. FOR DIABETES   mirtazapine  15 MG tablet Commonly known as: REMERON  TAKE 1 TABLET BY MOUTH EVERYDAY AT BEDTIME   morphine  30 MG 12 hr tablet Commonly known as: MS CONTIN  Take 1 tablet (30 mg total) by mouth every 8 (eight) hours for 10 doses.   Multi-Vitamins Tabs Take 1 tablet by mouth daily with breakfast.   Naloxone  HCl 3 MG/0.1ML Liqd Place 1 spray into both nostrils once. For known/suspected opiod overdose. Every 2-3 minutes in alternating nostril till EMS arrives.   ondansetron  8 MG tablet Commonly known as: ZOFRAN  Take 1 tablet (8 mg total) by mouth every 8 (eight) hours as needed for nausea or vomiting.   OneTouch Ultra test strip Generic drug: glucose blood USE UP TO 4 TIMES DAILY AS DIRECTED   oxyCODONE -acetaminophen  10-325 MG tablet Commonly known as: PERCOCET Take 1 tablet by mouth every 4 (four) hours as needed for up to 10 days.   pioglitazone  45 MG tablet Commonly known as: ACTOS  TAKE 1 TABLET (45 MG TOTAL)  BY MOUTH DAILY. FOR DIABETES.   polyethylene glycol powder 17 GM/SCOOP powder Commonly known as: GLYCOLAX /MIRALAX  Take 17 g by mouth daily as needed for moderate constipation or mild constipation.   pravastatin  40 MG tablet Commonly known as: PRAVACHOL  TAKE 1 TABLET BY MOUTH EVERY DAY IN THE EVENING FOR CHOLESTEROL   predniSONE  50 MG tablet Commonly known as: DELTASONE  Take 1 tablet (50 mg total) by mouth daily with breakfast for 4 days. Start taking on: August 03, 2024   PRESCRIPTION MEDICATION CPAP- At bedtime   testosterone  cypionate 200 MG/ML injection Commonly known as: DEPOTESTOSTERONE CYPIONATE Inject 200 mg into the muscle every 14 (fourteen) days.   TURMERIC COMPLEX/BLACK PEPPER PO Take 1 tablet by mouth in the morning and at bedtime.        Allergies  Allergen Reactions   Bupropion Other (See Comments)    Racing heart   Hydrochlorothiazide Other (See Comments)    Cramping to lower extremities     Consultations: Oncology   Procedures/Studies: CT Angio Chest/Abd/Pel for Dissection W and/or Wo Contrast Result Date: 07/31/2024 CLINICAL DATA:  Chest pain back pain lung cancer EXAM: CT ANGIOGRAPHY CHEST, ABDOMEN AND PELVIS TECHNIQUE: Non-contrast CT of the chest was initially obtained. Multidetector CT imaging through the chest, abdomen and pelvis was performed using the standard protocol during bolus administration of intravenous contrast. Multiplanar reconstructed images and MIPs were obtained and reviewed to evaluate the vascular anatomy. RADIATION DOSE REDUCTION: This exam was performed according to the departmental dose-optimization program which includes automated exposure control, adjustment of the mA and/or kV according to patient size and/or use of iterative reconstruction technique. CONTRAST:  OMNIPAQUE  IOHEXOL  350 MG/ML SOLN COMPARISON:  Chest x-ray 07/31/2024, CT 06/09/2024, 03/08/2024, 05/19/2023, 05/01/2023, multiple exams dating back to 03/26/2020 FINDINGS: CTA CHEST FINDINGS Cardiovascular: Non contrasted images of the chest demonstrate no acute intramural hematoma. Mild aortic atherosclerosis. No dissection is seen. Ectatic ascending aorta up to 3.8 cm. Coronary vascular calcification. Cardiomegaly. Trace pericardial effusion. Right-sided central venous port with tip in the right atrium. Mediastinum/Nodes: Patent trachea. No thyroid  mass. Progressive soft tissue density within the mediastinum, most evident in the subcarinal region, series 11 image 56 and surrounding the right bronchus, series 11 image 50 through 58. Increased right hilar soft tissue density compared to prior. Esophagus within normal limits. Lungs/Pleura: Volume loss right hemithorax as before. Suspect slight increased pleural thickening compared to prior. Right Peri hilar distortion and bronchiectasis corresponding to post radiation change but progressive confluent and more masslike consolidation with decreased  air bronchograms compared to prior and raising concern for tumor recurrence. Overall consolidative process is increased compared to the prior CT exams. Underlying mild fibrotic change at the right middle lobe but with interval increase in interstitial and ground-glass density. New irregular focus of airspace disease and ground-glass density in the anterior right base, series 7, image 93 measuring 10 mm. This is contiguous with irregular airspace disease in the anterior right lung base, series 7, image 97, with surrounding ground-glass. Within the posterior, medial right base, there is new heterogeneous ground-glass density and multiple small irregular nodular densities, for example 9 mm density on series 7, image 108 and adjacent 6 mm densities on series 7, image 108 and 107. Chronic mild hyperdense material in the right posterior pleural space. Musculoskeletal: Sternum appears intact. Multilevel degenerative changes. No definite acute osseous abnormality is seen. Review of the MIP images confirms the above findings. CTA ABDOMEN AND PELVIS FINDINGS VASCULAR Aorta: Normal caliber aorta without aneurysm, dissection,  vasculitis or significant stenosis. Moderate aortic atherosclerosis. Celiac: Patent without evidence of aneurysm, dissection, vasculitis or significant stenosis. SMA: Patent without evidence of aneurysm, dissection, vasculitis or significant stenosis. Renals: Both renal arteries are patent without evidence of aneurysm, dissection, vasculitis, fibromuscular dysplasia or significant stenosis. IMA: Patent without evidence of aneurysm, dissection, vasculitis or significant stenosis. Inflow: Patent without evidence of aneurysm, dissection, vasculitis or significant stenosis. Veins: Suboptimally assessed Review of the MIP images confirms the above findings. NON-VASCULAR Hepatobiliary: Nodular hepatic contour suggests possible cirrhosis change. Hepatic metastatic disease. Right hepatic dome lesion measures about  6.8 x 4.6 cm, previously 5.8 x 4.7 cm but was better seen on venous phase imaging. Vague caudate lesion measures 2.1 cm on series 11, image 123, 17 mm on the prior exam. No calcified gallstone or biliary dilatation. Fiducial markers in the inferior right hepatic lobe, adjacent to hypodense metastatic focus, this lesion measures 19 mm on series 11, image 123, previously 14 mm. Pancreas: Atrophic.  No acute inflammation Spleen: Heterogenous enhancement likely due to arterial phase. Adrenals/Urinary Tract: Stable negative left adrenal gland. Stable 15 mm right adrenal nodule. The kidneys show no hydronephrosis. Right renal cysts for which no imaging follow-up is recommended. The bladder is unremarkable. Stomach/Bowel: The stomach is decompressed but suspect mild diffuse gastric wall thickening. There is no dilated small bowel. No acute bowel wall thickening. Lymphatic: Multiple enlarged peripancreatic and porta hepatis nodes. Node adjacent to the proximal pancreas measures 31 mm on series 11, image 30, previously 21 mm. Node adjacent to the inferior pancreatic head measures 36 mm on series 11, image 146, previously 26 mm. Reproductive: Negative prostate Other: Negative for pelvic effusion or free air Musculoskeletal: No acute osseous abnormality. No suspicious bone lesion. Advanced disc space narrowing at L4-L5 with moderate severe canal stenosis due to combination of disc disease and posterior ligamentum flavum and facet disease. Disc space narrowing at L3-L4 with gas locules in the right foramen. Severe canal stenosis suspected likely due to disc disease and facet hypertrophy. 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 Review of the MIP images confirms the above findings. IMPRESSION: 1. Negative for acute aortic dissection or aneurysm. 2. Post therapeutic changes in the right hilar/suprahilar lung but with interim progressive masslike consolidative process in the right perihilar region with decreased air bronchograms compared to  prior, raising concern for tumor recurrence. Progressive soft tissue density within the mediastinum concerning for tumor recurrence/metastatic disease. 3. New irregular foci of airspace disease/nodularity and ground-glass density in the right middle lobe and right lung base as described above, indeterminate for tumor versus infectious or inflammatory process. 4. Hepatic metastatic disease with slight interval increase in size of hepatic lesions 5. Increase in size of peripancreatic and porta hepatis lymph nodes consistent with disease progression. 6. Advanced degenerative changes of the lumbar spine with suspected moderate to severe canal stenosis at L3-L4 and L4-L5. These findings would be better assessed with MRI Aortic Atherosclerosis (ICD10-I70.0). Electronically Signed   By: Luke Bun M.D.   On: 07/31/2024 18:48   DG Chest Portable 1 View Result Date: 07/31/2024 CLINICAL DATA:  Chest pain EXAM: PORTABLE CHEST 1 VIEW COMPARISON:  01/04/2024, CT 06/09/2024, radiograph 08/10/2023, 09/18/2022 FINDINGS: Right-sided central venous port with tip at the cavoatrial region. Volume loss right thorax with distortion and masslike consolidation in the right hilar/suprahilar lung appears slightly more confluent compared to the chest x-ray from January. Pleural thickening and or fluid not significantly changed. Stable enlarged cardiomediastinal silhouette. No pneumothorax IMPRESSION: Volume loss right thorax with  distortion and masslike consolidation in the right hilar/suprahilar lung appears slightly more confluent compared to the chest x-ray from January but probably stable compared with scout image from CT in June and likely related to post treatment changes. Contrast-enhanced chest CT follow-up if indicated. Similar right pleural fluid and or thickening. Cardiomegaly Electronically Signed   By: Luke Bun M.D.   On: 07/31/2024 17:30      Subjective: Patient seen and examined at the bedside today.   Hemodynamically stable.  Back pain significantly improved today.  He did great with the physical therapy.  No new complaints.  He is eager to go home.  Medically stable for discharge  Discharge Exam: Vitals:   08/02/24 0300 08/02/24 0458  BP:  129/84  Pulse:  78  Resp: 13 16  Temp:  (!) 97.4 F (36.3 C)  SpO2:  99%   Vitals:   08/01/24 1926 08/02/24 0015 08/02/24 0300 08/02/24 0458  BP: 121/68   129/84  Pulse: (!) 105   78  Resp: 17 (!) 7 13 16   Temp: 98.5 F (36.9 C)   (!) 97.4 F (36.3 C)  TempSrc: Oral   Oral  SpO2: 93%   99%  Weight:      Height:        General: Pt is alert, awake, not in acute distress, obese Cardiovascular: RRR, S1/S2 +, no rubs, no gallops Respiratory: CTA bilaterally, no wheezing, no rhonchi Abdominal: Soft, NT, ND, bowel sounds + Extremities: no edema, no cyanosis    The results of significant diagnostics from this hospitalization (including imaging, microbiology, ancillary and laboratory) are listed below for reference.     Microbiology: No results found for this or any previous visit (from the past 240 hours).   Labs: BNP (last 3 results) No results for input(s): BNP in the last 8760 hours. Basic Metabolic Panel: Recent Labs  Lab 07/29/24 1458 07/31/24 1709 08/01/24 0514  NA 138 138 136  K 3.9 4.4 3.7  CL 103 102 99  CO2 27 27 27   GLUCOSE 148* 136* 121*  BUN 15 17 14   CREATININE 0.89 0.90 0.83  CALCIUM 8.4* 8.2* 8.2*   Liver Function Tests: No results for input(s): AST, ALT, ALKPHOS, BILITOT, PROT, ALBUMIN in the last 168 hours. No results for input(s): LIPASE, AMYLASE in the last 168 hours. No results for input(s): AMMONIA in the last 168 hours. CBC: Recent Labs  Lab 07/29/24 1458 07/31/24 1709 08/01/24 0514 08/02/24 0622  WBC 3.0* 4.6 3.2* 3.6*  NEUTROABS 2.4  --   --   --   HGB 8.9* 8.5* 7.7* 8.5*  HCT 28.6* 28.1* 24.7* 26.7*  MCV 100.0 102.2* 101.6* 100.0  PLT 128* 200 183 211   Cardiac  Enzymes: No results for input(s): CKTOTAL, CKMB, CKMBINDEX, TROPONINI in the last 168 hours. BNP: Invalid input(s): POCBNP CBG: Recent Labs  Lab 08/01/24 1117 08/01/24 1629 08/01/24 2128 08/02/24 0741 08/02/24 1143  GLUCAP 120* 166* 275* 139* 156*   D-Dimer No results for input(s): DDIMER in the last 72 hours. Hgb A1c No results for input(s): HGBA1C in the last 72 hours. Lipid Profile No results for input(s): CHOL, HDL, LDLCALC, TRIG, CHOLHDL, LDLDIRECT in the last 72 hours. Thyroid  function studies No results for input(s): TSH, T4TOTAL, T3FREE, THYROIDAB in the last 72 hours.  Invalid input(s): FREET3 Anemia work up No results for input(s): VITAMINB12, FOLATE, FERRITIN, TIBC, IRON, RETICCTPCT in the last 72 hours. Urinalysis    Component Value Date/Time   COLORURINE YELLOW  07/31/2024 1713   APPEARANCEUR CLEAR 07/31/2024 1713   LABSPEC 1.026 07/31/2024 1713   PHURINE 5.0 07/31/2024 1713   GLUCOSEU NEGATIVE 07/31/2024 1713   GLUCOSEU 100 (A) 11/25/2017 1359   HGBUR NEGATIVE 07/31/2024 1713   BILIRUBINUR NEGATIVE 07/31/2024 1713   BILIRUBINUR neg 01/01/2024 1358   KETONESUR NEGATIVE 07/31/2024 1713   PROTEINUR NEGATIVE 07/31/2024 1713   UROBILINOGEN 1.0 01/01/2024 1358   UROBILINOGEN 0.2 11/25/2017 1359   NITRITE NEGATIVE 07/31/2024 1713   LEUKOCYTESUR NEGATIVE 07/31/2024 1713   Sepsis Labs Recent Labs  Lab 07/29/24 1458 07/31/24 1709 08/01/24 0514 08/02/24 0622  WBC 3.0* 4.6 3.2* 3.6*   Microbiology No results found for this or any previous visit (from the past 240 hours).  Please note: You were cared for by a hospitalist during your hospital stay. Once you are discharged, your primary care physician will handle any further medical issues. Please note that NO REFILLS for any discharge medications will be authorized once you are discharged, as it is imperative that you return to your primary care physician (or  establish a relationship with a primary care physician if you do not have one) for your post hospital discharge needs so that they can reassess your need for medications and monitor your lab values.    Time coordinating discharge: 40 minutes  SIGNED:   Ivonne Mustache, MD  [facility] 08/02/2024, 12:14 PM Pager [phone]  If 7PM-7AM, please contact night-coverage [email or web address] Password [email or web address]

## 2024-08-02 NOTE — Progress Notes (Signed)
 Discharge medication delivered to patient at bedside [facility]

## 2024-08-03 ENCOUNTER — Telehealth: Payer: Self-pay | Admitting: *Deleted

## 2024-08-03 ENCOUNTER — Inpatient Hospital Stay: Payer: PRIVATE HEALTH INSURANCE

## 2024-08-03 NOTE — Transitions of Care (Post Inpatient/ED Visit) (Signed)
   08/03/2024  Name: Adam Wise MRN: [id number] DOB: October 31, 1951  Today's TOC FU Call Status: Today's TOC FU Call Status:: Unsuccessful Call (1st Attempt) Unsuccessful Call (1st Attempt) Date: 08/03/24  Attempted to reach the patient regarding the most recent Inpatient/ED visit.  Follow Up Plan: Additional outreach attempts will be made to reach the patient to complete the Transitions of Care (Post Inpatient/ED visit) call.   Cathlean Headland BSN RN [facility] Management Coordinator [email or web address] Direct Dial: [phone]  Fax: [phone] Website: [email or web address]

## 2024-08-03 NOTE — Progress Notes (Unsigned)
 [facility] OFFICE PROGRESS NOTE  System, Provider Not In No address on file  DIAGNOSIS: Relapsed extensive stage (T4, N3, M1c)  small cell lung cancer diagnosed in April 2021 and presented with large right upper lobe/right hilar mass with ipsilateral and contralateral mediastinal and right supraclavicular lymphadenopathy in addition to multiple liver lesions. He has disease progression after the first line of chemotherapy in December 2021.   PRIOR THERAPY: 1) Palliative radiotherapy to the right upper lobe lung mass under the care of Dr. Dewey. 2) Systemic chemotherapy with carboplatin  for AUC of 5 on day 1, etoposide  100 mg/M2 on days 1, 2 and 3 in addition to Imfinzi  1500 mg IV every 3 weeks with chemotherapy then every 4 weeks for maintenance if the patient has no evidence for progression.  He will also receive Cosela  240 mg/m2 on the days of the chemotherapy.  Status post 9 cycles.  Starting from cycle #5 the patient will be on maintenance treatment with immunotherapy with Imfinzi  1500 mg IV every 4 weeks. Last dose of chemotherapy was given on November 13, 2020. This treatment was discontinued secondary to disease progression 3) Systemic chemotherapy with carboplatin  for AUC of 5 on day 1, etoposide  100 mg/M2 on days 1, 2 and 3, Tecentriq  1200 mg IV every 3 weeks as well as Cosela  250 mg/M2 on the days of the chemotherapy every 3 weeks.  First dose December 18, 2020.  Status post 8 cycles. 4) Zepzelca  (lurbinectedin ) 3.2 mgm2 IV every 3 weeks. Last dose on 01/23/22. Status post 12 cycles.  5) Palliative systemic chemotherapy with irinotecan  65 mg/m2 on days 1 and 8 IV every 3 weeks.  Status post 3 cycles.  Last dose was given April 01, 2022 discontinued secondary to disease progression. 6) SBRT to the progressive liver lesions under the care of Dr. Dewey.  Last fraction January 27, 2023. 7) systemic chemotherapy with carboplatin  for AUC of 5 on day 1, etoposide  100 Mg/M2 on days 1, 2  and 3 with Cosela  before the chemotherapy.  First dose expected to start on 05/28/2022.  Status post 15 cycles.  Starting from cycle #5 his carboplatin  will be reduced to AUC of 4 and 2 etoposide  80 Mg/M2.  Last dose was giving April 07, 2023 discontinued for suspicious disease progression. 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 Dewey. Last dose on 05/16/24.  11) Topotecan  1.2 mg/M2 on days 1-5 every 3 weeks with Cosela  240 Mg/M2 IV before the chemotherapy.  First dose January 11, 2024. Status post 8 cycles.   CURRENT THERAPY: Palliative systemic chemotherapy with carboplatin  on day 1 and etoposide  on days 1, 2, and 3 IV every 3 weeks.  First dose expected around 08/10/2024.  Dr. Sherrod will likely reduce the dose from cycle #1  INTERVAL HISTORY: Adam Wise 73 y.o. male returns to the clinic today for a follow-up visit accompanied by his wife.  The was undergoing systemic chemotherapy with topotecan .   Patient has been through several lines of therapy for his small cell lung cancer.  In the interval since being seen he did have some cytopenias which required a blood transfusion on 07/25/2024.  The patient has some chronic back pain for which he takes morphine  and oxycodone .  He was scheduled to see a back specialist at [facility] for degenerative changes of the spine.  He also has tried muscle relaxers.  He saw  Dr. Waylon this morning and they are planning on arranging for a injection in his spine next week on 08/10/2024 at 8:30 AM.  In the interval since being seen he was seen in the emergency room for intractable back pain from 8/15-8/19.  His CT scan was negative for acute aortic dissection or aneurysm.  There was some changes concerning for lung cancer recurrence/metastatic disease with increasing size of the hepatic metastatic lesions, increase in size in lymph nodes, and advanced degenerative  changes in the lumbar spine with suspected mild to severe canal stenosis at L3-L4 and L4-L5.  The patient was admitted for intractable back pain.  He was given steroids with significant improvement in his symptoms.   He reports fatigue.  He is also lost a significant amount of weight unintentionally.  He has some weakness in his legs and difficulty with walking.  He has shortness of breath.  He denies any significant cough, chest pain, or hemoptysis.  He is here today for evaluation of hospital follow-up visit.   MEDICAL HISTORY: Past Medical History:  Diagnosis Date   Acute on chronic respiratory failure with hypoxia (HCC) 10/12/2021   Chickenpox    Chronic knee pain    Chronic low back pain    COPD exacerbation (HCC) 10/11/2021   COPD with exacerbation (HCC) 10/12/2021   Coronary artery calcification seen on CAT scan 11/2021   Coronary CTA 11/27/2021: Moderate to large right pleural effusion and compressive atelectasis right lung base. => Coronary Calcium Score 657.  Diffuse RCA calcification.  Minimal mild disease in the LAD and diagonal branches. == Overall limited study.  Notable artifact.   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 He has disease progression after the first line of chemotherapy in December 2021.   Testosterone  deficiency    Type 2 diabetes mellitus (HCC)     ALLERGIES:  is allergic to bupropion and hydrochlorothiazide.  MEDICATIONS:  Current Outpatient Medications  Medication Sig  Dispense Refill   albuterol  (VENTOLIN  HFA) 108 (90 Base) MCG/ACT inhaler Inhale 2 puffs into the lungs every 4 (four) hours as needed for shortness of breath. (Patient taking differently: Inhale 2 puffs into the lungs daily as needed for shortness of breath or wheezing.) 18 each 0   B-D 3CC LUER-LOK SYR 22GX1 22G X 1 3 ML MISC USE AS INSTRUCTED FOR TESTOSTERONE  INJECTION EVERY 2 WEEKS 10 each 2   Black Pepper-Turmeric (TURMERIC COMPLEX/BLACK PEPPER PO) Take 1 tablet by mouth in the morning and at bedtime.     Coenzyme Q10 (COQ10) 200 MG CAPS Take 200 mg by mouth daily.     cyclobenzaprine  (FLEXERIL ) 10 MG tablet Take 1 tablet (10 mg total) by mouth 2 (two) times daily as needed for muscle spasms. (Patient not taking: Reported on 08/01/2024) 20 tablet 0   docusate sodium  (COLACE) 100 MG capsule Take 1 capsule (100 mg total) by mouth every 12 (twelve) hours. (Patient taking differently: Take 100 mg by mouth daily as needed for mild constipation or moderate constipation.) 60 capsule 0  ELIQUIS  5 MG TABS tablet TAKE 1 TABLET BY MOUTH TWICE A DAY 180 tablet 2   esomeprazole (NEXIUM) 20 MG capsule Take 20 mg by mouth in the morning and at bedtime.     gabapentin  (NEURONTIN ) 300 MG capsule Take 1 capsule (300 mg total) by mouth 2 (two) times daily. For back pain. 180 capsule 1   glucose blood (ONETOUCH ULTRA) test strip USE UP TO 4 TIMES DAILY AS DIRECTED 400 strip 5   Insulin  Pen Needle (BD PEN NEEDLE NANO U/F) 32G X 4 MM MISC Use with insulin  as prescribed Dx Code: E11.9 100 each 3   ipratropium-albuterol  (DUONEB) 0.5-2.5 (3) MG/3ML SOLN Take 3 mLs by nebulization every 4 (four) hours while awake for 3 days, THEN 3 mLs every 4 (four) hours as needed (shortness of breath or wheezing). (Patient taking differently: Take 3 ml by nebulizer as needed for wheezing and Sob.) 360 mL 0   KRILL OIL PO Take 1 capsule by mouth daily.     Lancets MISC USE UP TO 3 TIMES DAILY AS DIRECTED 100 each 2   lidocaine   (LIDODERM ) 5 % Place 1 patch onto the skin daily. Remove & Discard patch within 12 hours or as directed by MD (Patient taking differently: Place 1 patch onto the skin daily as needed (pain).) 30 patch 0   losartan  (COZAAR ) 50 MG tablet TAKE 1 TABLET (50 MG TOTAL) BY MOUTH DAILY. FOR BLOOD PRESSURE. 90 tablet 3   metFORMIN  (GLUCOPHAGE ) 1000 MG tablet TAKE 1 TABLET (1,000 MG TOTAL) BY MOUTH 2 (TWO) TIMES DAILY WITH A MEAL. FOR DIABETES 180 tablet 0   mirtazapine  (REMERON ) 15 MG tablet TAKE 1 TABLET BY MOUTH EVERYDAY AT BEDTIME 90 tablet 1   Multiple Vitamin (MULTI-VITAMINS) TABS Take 1 tablet by mouth daily with breakfast.     Naloxone  HCl 3 MG/0.1ML LIQD Place 1 spray into both nostrils once. For known/suspected opiod overdose. Every 2-3 minutes in alternating nostril till EMS arrives.     ondansetron  (ZOFRAN ) 8 MG tablet Take 1 tablet (8 mg total) by mouth every 8 (eight) hours as needed for nausea or vomiting. 30 tablet 3   oxyCODONE -acetaminophen  (PERCOCET) 10-325 MG tablet Take 1 tablet by mouth every 4 (four) hours as needed for up to 10 days. 10 tablet 0   pioglitazone  (ACTOS ) 45 MG tablet TAKE 1 TABLET (45 MG TOTAL) BY MOUTH DAILY. FOR DIABETES. 90 tablet 1   polyethylene glycol powder (GLYCOLAX /MIRALAX ) 17 GM/SCOOP powder Take 17 g by mouth daily as needed for moderate constipation or mild constipation.     pravastatin  (PRAVACHOL ) 40 MG tablet TAKE 1 TABLET BY MOUTH EVERY DAY IN THE EVENING FOR CHOLESTEROL 90 tablet 3   predniSONE  (DELTASONE ) 50 MG tablet Take 1 tablet (50 mg total) by mouth daily with breakfast for 4 days. 4 tablet 0   PRESCRIPTION MEDICATION CPAP- At bedtime     testosterone  cypionate (DEPOTESTOSTERONE CYPIONATE) 200 MG/ML injection Inject 200 mg into the muscle every 14 (fourteen) days.     No current facility-administered medications for this visit.   Facility-Administered Medications Ordered in Other Visits  Medication Dose Route Frequency Provider Last Rate Last Admin    heparin  lock flush 100 unit/mL  500 Units Intracatheter Once Sherrod Sherrod, MD       sodium chloride  flush (NS) 0.9 % injection 10 mL  10 mL Intracatheter Once Sherrod Sherrod, MD        SURGICAL HISTORY:  Past Surgical History:  Procedure Laterality Date  CHEST TUBE INSERTION Right 01/20/2022   Procedure: CHEST TUBE INSERTION;  Surgeon: Claudene Toribio BROCKS, MD;  Location: [facility] ENDOSCOPY;  Service: Pulmonary;  Laterality: Right;  w/ Talc  Pleurodesis, planned admit for Obs afterwards   CHEST TUBE INSERTION Right 04/28/2022   Procedure: INSERTION PLEURAL DRAINAGE CATHETER - Pigtail tail, drainage;  Surgeon: Claudene Toribio BROCKS, MD;  Location: [facility] ENDOSCOPY;  Service: Pulmonary;  Laterality: Right;  talc  pleurodesis   CHEST TUBE INSERTION Right 09/01/2022   Procedure: INSERTION PLEURAL DRAINAGE CATHETER;  Surgeon: Alaine Vicenta NOVAK, MD;  Location: [facility] ENDOSCOPY;  Service: Cardiopulmonary;  Laterality: Right;  pigtail catheter placement with talc  pleurodesis   COLONOSCOPY WITH PROPOFOL  N/A 12/17/2018   Procedure: COLONOSCOPY WITH PROPOFOL ;  Surgeon: Janalyn Keene NOVAK, MD;  Location: [facility] ENDOSCOPY;  Service: Gastroenterology;  Laterality: N/A;   IR IMAGING GUIDED PORT INSERTION  04/17/2020   IR THORACENTESIS ASP PLEURAL SPACE W/IMG GUIDE  11/29/2021   IR THORACENTESIS ASP PLEURAL SPACE W/IMG GUIDE  01/03/2022   JOINT REPLACEMENT Bilateral    REPLACEMENT TOTAL KNEE BILATERAL  2015   TALC  PLEURODESIS  09/01/2022   Procedure: TALC  PLEURADESIS;  Surgeon: Alaine Vicenta NOVAK, MD;  Location: [facility] ENDOSCOPY;  Service: Cardiopulmonary;;   TONSILLECTOMY  1960   TRANSTHORACIC ECHOCARDIOGRAM  05/2020   a) 05/2020: EF 55 to 60%.  No R WMA.  Mild LVH.  Indeterminate LVEDP.  Unable to assess RVP.  Normal aortic and mitral valves.  Mildly elevated RAP.; b) 09/2021: EF 50-55%. No RWMA. Mild LVH. ~ LVEDP. Mild LA Dilation. NORMAL RV/RAP.  Normal MV/AoV.    REVIEW OF SYSTEMS:   Review of Systems  Constitutional:  Positive for fatigue, appetite change, weight loss.  Negative for  chills and fever.   HENT: Negative for mouth sores, nosebleeds, sore throat and trouble swallowing.   Eyes: Negative for eye problems and icterus.  Respiratory: Positive for shortness of breath. Negative for cough, hemoptysis, shortness of breath and wheezing.   Cardiovascular: Negative for chest pain and leg swelling.  Gastrointestinal: Negative for abdominal pain, constipation, diarrhea, nausea and vomiting.  Genitourinary: Negative for bladder incontinence, difficulty urinating, dysuria, frequency and hematuria.   Musculoskeletal: Positive for back pain. Negative for gait problem, neck pain and neck stiffness.  Skin: Negative for itching and rash.  Neurological: Positive for lower extremity weakness. Negative for dizziness, gait problem, headaches, light-headedness and seizures.  Hematological: Negative for adenopathy. Does not bruise/bleed easily.  Psychiatric/Behavioral: Negative for confusion, depression and sleep disturbance. The patient is not nervous/anxious.     PHYSICAL EXAMINATION:  There were no vitals taken for this visit.  ECOG PERFORMANCE STATUS: 1  Physical Exam  Constitutional: Oriented to person, place, and time and well-developed, well-nourished, and in no distress.  HENT:  Head: Normocephalic and atraumatic.  Mouth/Throat: Oropharynx is clear and moist. No oropharyngeal exudate.  Eyes: Conjunctivae are normal. Right eye exhibits no discharge. Left eye exhibits no discharge. No scleral icterus.  Neck: Normal range of motion. Neck supple.  Cardiovascular: Tachycardic, regular rhythm, normal heart sounds and intact distal pulses.   Pulmonary/Chest: Effort normal. Quiet breath sounds in the lung bilaterally. No respiratory distress. No rales.  Abdominal: Soft. Bowel sounds are normal. Exhibits no distension and no mass. There is no tenderness.  Musculoskeletal: Normal range of motion. Exhibits no edema.   Lymphadenopathy:    No cervical adenopathy.  Neurological: Alert and oriented to person, place, and time. Exhibits muscle wasting. Examined in the wheelchair.  Skin: Skin is warm and dry.  No rash noted. Not diaphoretic. No erythema. No pallor.  Psychiatric: Mood, memory and judgment normal.  Vitals reviewed.  LABORATORY DATA: Lab Results  Component Value Date   WBC 3.6 (L) 08/02/2024   HGB 8.5 (L) 08/02/2024   HCT 26.7 (L) 08/02/2024   MCV 100.0 08/02/2024   PLT 211 08/02/2024      Chemistry      Component Value Date/Time   NA 136 08/01/2024 0514   K 3.7 08/01/2024 0514   CL 99 08/01/2024 0514   CO2 27 08/01/2024 0514   BUN 14 08/01/2024 0514   CREATININE 0.83 08/01/2024 0514   CREATININE 1.07 07/25/2024 1244   CREATININE 1.45 (H) 10/18/2021 1533      Component Value Date/Time   CALCIUM 8.2 (L) 08/01/2024 0514   ALKPHOS 113 07/25/2024 1244   AST 23 07/25/2024 1244   ALT 16 07/25/2024 1244   BILITOT 0.4 07/25/2024 1244       RADIOGRAPHIC STUDIES:  CT Angio Chest/Abd/Pel for Dissection W and/or Wo Contrast Result Date: 07/31/2024 CLINICAL DATA:  Chest pain back pain lung cancer EXAM: CT ANGIOGRAPHY CHEST, ABDOMEN AND PELVIS TECHNIQUE: Non-contrast CT of the chest was initially obtained. Multidetector CT imaging through the chest, abdomen and pelvis was performed using the standard protocol during bolus administration of intravenous contrast. Multiplanar reconstructed images and MIPs were obtained and reviewed to evaluate the vascular anatomy. RADIATION DOSE REDUCTION: This exam was performed according to the departmental dose-optimization program which includes automated exposure control, adjustment of the mA and/or kV according to patient size and/or use of iterative reconstruction technique. CONTRAST:  OMNIPAQUE  IOHEXOL  350 MG/ML SOLN COMPARISON:  Chest x-ray 07/31/2024, CT 06/09/2024, 03/08/2024, 05/19/2023, 05/01/2023, multiple exams dating back to 03/26/2020  FINDINGS: CTA CHEST FINDINGS Cardiovascular: Non contrasted images of the chest demonstrate no acute intramural hematoma. Mild aortic atherosclerosis. No dissection is seen. Ectatic ascending aorta up to 3.8 cm. Coronary vascular calcification. Cardiomegaly. Trace pericardial effusion. Right-sided central venous port with tip in the right atrium. Mediastinum/Nodes: Patent trachea. No thyroid  mass. Progressive soft tissue density within the mediastinum, most evident in the subcarinal region, series 11 image 56 and surrounding the right bronchus, series 11 image 50 through 58. Increased right hilar soft tissue density compared to prior. Esophagus within normal limits. Lungs/Pleura: Volume loss right hemithorax as before. Suspect slight increased pleural thickening compared to prior. Right Peri hilar distortion and bronchiectasis corresponding to post radiation change but progressive confluent and more masslike consolidation with decreased air bronchograms compared to prior and raising concern for tumor recurrence. Overall consolidative process is increased compared to the prior CT exams. Underlying mild fibrotic change at the right middle lobe but with interval increase in interstitial and ground-glass density. New irregular focus of airspace disease and ground-glass density in the anterior right base, series 7, image 93 measuring 10 mm. This is contiguous with irregular airspace disease in the anterior right lung base, series 7, image 97, with surrounding ground-glass. Within the posterior, medial right base, there is new heterogeneous ground-glass density and multiple small irregular nodular densities, for example 9 mm density on series 7, image 108 and adjacent 6 mm densities on series 7, image 108 and 107. Chronic mild hyperdense material in the right posterior pleural space. Musculoskeletal: Sternum appears intact. Multilevel degenerative changes. No definite acute osseous abnormality is seen. Review of the MIP  images confirms the above findings. CTA ABDOMEN AND PELVIS FINDINGS VASCULAR Aorta: Normal caliber aorta without aneurysm, dissection, vasculitis or significant stenosis. Moderate  aortic atherosclerosis. Celiac: Patent without evidence of aneurysm, dissection, vasculitis or significant stenosis. SMA: Patent without evidence of aneurysm, dissection, vasculitis or significant stenosis. Renals: Both renal arteries are patent without evidence of aneurysm, dissection, vasculitis, fibromuscular dysplasia or significant stenosis. IMA: Patent without evidence of aneurysm, dissection, vasculitis or significant stenosis. Inflow: Patent without evidence of aneurysm, dissection, vasculitis or significant stenosis. Veins: Suboptimally assessed Review of the MIP images confirms the above findings. NON-VASCULAR Hepatobiliary: Nodular hepatic contour suggests possible cirrhosis change. Hepatic metastatic disease. Right hepatic dome lesion measures about 6.8 x 4.6 cm, previously 5.8 x 4.7 cm but was better seen on venous phase imaging. Vague caudate lesion measures 2.1 cm on series 11, image 123, 17 mm on the prior exam. No calcified gallstone or biliary dilatation. Fiducial markers in the inferior right hepatic lobe, adjacent to hypodense metastatic focus, this lesion measures 19 mm on series 11, image 123, previously 14 mm. Pancreas: Atrophic.  No acute inflammation Spleen: Heterogenous enhancement likely due to arterial phase. Adrenals/Urinary Tract: Stable negative left adrenal gland. Stable 15 mm right adrenal nodule. The kidneys show no hydronephrosis. Right renal cysts for which no imaging follow-up is recommended. The bladder is unremarkable. Stomach/Bowel: The stomach is decompressed but suspect mild diffuse gastric wall thickening. There is no dilated small bowel. No acute bowel wall thickening. Lymphatic: Multiple enlarged peripancreatic and porta hepatis nodes. Node adjacent to the proximal pancreas measures 31 mm on  series 11, image 30, previously 21 mm. Node adjacent to the inferior pancreatic head measures 36 mm on series 11, image 146, previously 26 mm. Reproductive: Negative prostate Other: Negative for pelvic effusion or free air Musculoskeletal: No acute osseous abnormality. No suspicious bone lesion. Advanced disc space narrowing at L4-L5 with moderate severe canal stenosis due to combination of disc disease and posterior ligamentum flavum and facet disease. Disc space narrowing at L3-L4 with gas locules in the right foramen. Severe canal stenosis suspected likely due to disc disease and facet hypertrophy. 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 Review of the MIP images confirms the above findings. IMPRESSION: 1. Negative for acute aortic dissection or aneurysm. 2. Post therapeutic changes in the right hilar/suprahilar lung but with interim progressive masslike consolidative process in the right perihilar region with decreased air bronchograms compared to prior, raising concern for tumor recurrence. Progressive soft tissue density within the mediastinum concerning for tumor recurrence/metastatic disease. 3. New irregular foci of airspace disease/nodularity and ground-glass density in the right middle lobe and right lung base as described above, indeterminate for tumor versus infectious or inflammatory process. 4. Hepatic metastatic disease with slight interval increase in size of hepatic lesions 5. Increase in size of peripancreatic and porta hepatis lymph nodes consistent with disease progression. 6. Advanced degenerative changes of the lumbar spine with suspected moderate to severe canal stenosis at L3-L4 and L4-L5. These findings would be better assessed with MRI Aortic Atherosclerosis (ICD10-I70.0). Electronically Signed   By: Luke Bun M.D.   On: 07/31/2024 18:48   DG Chest Portable 1 View Result Date: 07/31/2024 CLINICAL DATA:  Chest pain EXAM: PORTABLE CHEST 1 VIEW COMPARISON:  01/04/2024, CT 06/09/2024, radiograph  08/10/2023, 09/18/2022 FINDINGS: Right-sided central venous port with tip at the cavoatrial region. Volume loss right thorax with distortion and masslike consolidation in the right hilar/suprahilar lung appears slightly more confluent compared to the chest x-ray from January. Pleural thickening and or fluid not significantly changed. Stable enlarged cardiomediastinal silhouette. No pneumothorax IMPRESSION: Volume loss right thorax with distortion and masslike consolidation in  the right hilar/suprahilar lung appears slightly more confluent compared to the chest x-ray from January but probably stable compared with scout image from CT in June and likely related to post treatment changes. Contrast-enhanced chest CT follow-up if indicated. Similar right pleural fluid and or thickening. Cardiomegaly Electronically Signed   By: Luke Bun M.D.   On: 07/31/2024 17:30     ASSESSMENT/PLAN:  This is a very pleasant 73 year old Caucasian male diagnosed with extensive stage (T4, N3, M1C) small cell lung cancer presented with large right upper lobe lung mass in addition to mediastinal and right supraclavicular lymphadenopathy and multiple metastatic liver lesions diagnosed in April 2021.    The patient initially underwent systemic chemotherapy with carboplatin  for an AUC of 5 on day 1, etoposide  100 mg per metered squared on days 1, 2, and 3 in addition to Cosela  for myeloprotection.  He also received immunotherapy with Imfinzi  on day 1 of every chemotherapy cycle.  He is status post 8 cycles.  Starting from cycle #5 he was on single agent immunotherapy with Imfinzi  IV every 4 weeks   The patient had evidence of disease progression on his scan from December 2021.  He was then started on systemic chemotherapy with carboplatin  for an AUC of 5 on day 1, etoposide  100 mg per metered squared on days 1, 2, and 3 in addition to Tecentriq  1200 mg on day 1, the patient is status post 8 cycles.  Starting from cycle #5, the  patient started maintenance immunotherapy with Tecentriq .  This was discontinued due to evidence of disease progression.   The patient then underwent Zepzelca  IV every 3 weeks.  He is status post his 12 cycles and he tolerated it well except for fatigue a few days after treatment.  This was discontinued in February 2023 due to evidence of disease progression.    The patient then underwent treatment with irinotecan  65 mg per metered squared on days 1 and 8 IV every 3 weeks.  The patient is status post 3 cycles and tolerated it fairly well without any concerning adverse side effects.  This was discontinued secondary to disease progression.   Dr. Sherrod saw the patient after this to discuss the options including palliative care and hospice versus single agent Gemzar, or Taxol.  He also discussed repeating his initial treatment with carboplatin  and etoposide  since he had a good response to treatment in the past.   He was referred to Dr. Boyd at [facility]  for consideration of enrollment in the clinical trial with tarlatamab.    He also saw Dr. Lysbeth at [facility] for consideration of enrollment in the [place] [id number] trial.  The patient was not interested due to the travel burden.   The patient was interested in restarting systemic chemotherapy with carboplatin  for an AUC of 5 on day 1 and etoposide  100 mg per metered squared on days 1, 2, and 3 with Cosela . He is status post 14 cycles and tolerated it well. His dose of carboplatin  was reduced to an AUC of 4 and etoposide  80 mg per metered squared.     He underwent SBRT to a liver lesion  which was completed on 01/27/23.   The patient was seen by Dr. Boyd at [facility] and treated with tarlatamab which is a by specific T-cell engager (BiTE) for 8 months. Unfortunately the repeat CT scan of the chest, abdomen and pelvis in December 2024 showed interval mild to moderate increase in the size of  the segment 7 liver lesion in addition to interval mild  increase in the size of portacaval and common hepatic artery lymph nodes with stable size retroperitoneal lymph nodes and stable size right adrenal nodule. There was no evidence for disease progression in the chest. There was focal enhancement in the right iliac bone concerning for metastatic disease.    He completed palliative radiation to the right iliac bone lesion in February 2025.   Dr. Sherrod recommended Topotecan  regimen: 5 days in a row every 3 weeks.  He tolerates it fairly well except he did have some cytopenias.  He is status post 8 cycles. His dose was reduced starting with cycle #2.     He had some progressive disease in the upper abdominal lymph nodes/liver.    He underwent SBRT and completed this on 05/16/2024.  The patient was recently admitted to the hospital for intractable back pain.  He had a CT scan that showed disease progression.   The patient was seen with Dr. Sherrod today who personally independently reviewed the scan and discussed the results with the patient today.  Dr. Sherrod discussed that we are running out of options.  Dr. Sherrod discussed oral treatment with Temodar which may not be effective or try to start him back on carboplatin  and etoposide  at a reduced dose.  The patient opted to try carboplatin  and etoposide .  We will arrange for his first treatment next week.  The patient understands that he has an incurable condition and all treatment is palliative in nature.  The patient is familiar with the adverse side effects of treatment.  We will monitor his labs closely and I will arrange for standing orders for sample of blood bank.  We will consider transfusion if his hemoglobin is less than 8.  He is scheduled for a injection in his back next week on 08/10/2024.  Will see him back with cycle #2.  Will reach out to palliative care to see if they need to schedule a follow-up appointment with him.  He will continue on morphine  and oxycodone  for back  pain.  The patient was advised to call immediately if he has any concerning symptoms in the interval. The patient voices understanding of current disease status and treatment options and is in agreement with the current care plan. All questions were answered. The patient knows to call the clinic with any problems, questions or concerns. We can certainly see the patient much sooner if necessary  No orders of the defined types were placed in this encounter.     Bertice Risse L Candance Bohlman, PA-C 08/03/24  ADDENDUM: Hematology/Oncology Attending:  I had a face-to-face encounter with the patient today.  I reviewed his records, labs, recent scan and recommended his care plan.  This is a very pleasant 73 years old white male with metastatic small cell lung cancer that was initially diagnosed as extensive stage disease in April 2021 status post several treatment in the past including systemic chemotherapy with carboplatin , etoposide  and immunotherapy including Imfinzi  and Tecentriq  several times the patient was also treated with Tarlatamab, Lurbinectedin , irinotecan  and most recently topotecan .  He was admitted to the hospital recently with shortness of breath and chronic back pain.  He also had chemotherapy-induced cytopenia requiring PRBCs transfusion during his hospitalization.  He had repeat CT scan of the chest, abdomen and pelvis during his hospitalization and the scan showed no evidence for acute aortic dissection or aneurysm but there was concern for disease recurrence in the chest was progressive  soft tissue density within the mediastinum.  She also has worsening liver metastasis as well as increase of the peripancreatic and porta hepatis lymph nodes consistent with disease progression. I had a lengthy discussion with the patient and his wife today about his current condition and treatment options.  I gave the patient the option of palliative care on hospice versus consideration of other treatment  options.  He understands that we are running out of good option at this point.  He did have good response to carboplatin  and etoposide  in the past when it was repeated after long break of the treatment. His last treatment with carboplatin  and etoposide  was more than a year ago.  He is interested in this option.  We will start him on a reduced dose carboplatin  for AUC of 4 on day 1 and etoposide  80 Mg/M2 on days 1, 2 and 3 every 3 weeks.  We will repeat his imaging studies after 2 cycles of this treatment to see if the patient is having any benefit from the treatment. He is expected to start the first cycle of this treatment next week. We will see him back for follow-up visit in 2 weeks for evaluation and management of any adverse effect of his treatment. The total time spent in the appointment was 40 minutes including review of chart and various tests results, discussions about plan of care and coordination of care plan . Disclaimer: This note was dictated with voice recognition software. Similar sounding words can inadvertently be transcribed and may be missed upon review. Sherrod MARLA Sherrod, MD

## 2024-08-04 ENCOUNTER — Telehealth: Payer: Self-pay

## 2024-08-04 ENCOUNTER — Inpatient Hospital Stay (HOSPITAL_BASED_OUTPATIENT_CLINIC_OR_DEPARTMENT_OTHER): Admitting: Physician Assistant

## 2024-08-04 ENCOUNTER — Inpatient Hospital Stay

## 2024-08-04 ENCOUNTER — Inpatient Hospital Stay: Payer: PRIVATE HEALTH INSURANCE

## 2024-08-04 ENCOUNTER — Telehealth: Payer: Self-pay | Admitting: Primary Care

## 2024-08-04 VITALS — BP 122/77 | HR 105 | Temp 98.3°F | Resp 16 | Ht 69.0 in | Wt 235.4 lb

## 2024-08-04 DIAGNOSIS — C3411 Malignant neoplasm of upper lobe, right bronchus or lung: Secondary | ICD-10-CM | POA: Diagnosis present

## 2024-08-04 DIAGNOSIS — Z5111 Encounter for antineoplastic chemotherapy: Secondary | ICD-10-CM | POA: Diagnosis not present

## 2024-08-04 DIAGNOSIS — M47816 Spondylosis without myelopathy or radiculopathy, lumbar region: Secondary | ICD-10-CM | POA: Diagnosis not present

## 2024-08-04 DIAGNOSIS — D649 Anemia, unspecified: Secondary | ICD-10-CM

## 2024-08-04 DIAGNOSIS — C7951 Secondary malignant neoplasm of bone: Secondary | ICD-10-CM | POA: Diagnosis not present

## 2024-08-04 DIAGNOSIS — E785 Hyperlipidemia, unspecified: Secondary | ICD-10-CM | POA: Diagnosis not present

## 2024-08-04 DIAGNOSIS — M5136 Other intervertebral disc degeneration, lumbar region with discogenic back pain only: Secondary | ICD-10-CM | POA: Diagnosis not present

## 2024-08-04 DIAGNOSIS — G893 Neoplasm related pain (acute) (chronic): Secondary | ICD-10-CM | POA: Diagnosis not present

## 2024-08-04 DIAGNOSIS — D696 Thrombocytopenia, unspecified: Secondary | ICD-10-CM

## 2024-08-04 DIAGNOSIS — M48061 Spinal stenosis, lumbar region without neurogenic claudication: Secondary | ICD-10-CM | POA: Diagnosis not present

## 2024-08-04 DIAGNOSIS — C787 Secondary malignant neoplasm of liver and intrahepatic bile duct: Secondary | ICD-10-CM | POA: Diagnosis not present

## 2024-08-04 DIAGNOSIS — Z515 Encounter for palliative care: Secondary | ICD-10-CM | POA: Diagnosis not present

## 2024-08-04 DIAGNOSIS — E119 Type 2 diabetes mellitus without complications: Secondary | ICD-10-CM | POA: Diagnosis not present

## 2024-08-04 LAB — CBC WITH DIFFERENTIAL (CANCER CENTER ONLY)
Abs Immature Granulocytes: 0.06 K/uL (ref 0.00–0.07)
Basophils Absolute: 0 K/uL (ref 0.0–0.1)
Basophils Relative: 0 %
Eosinophils Absolute: 0 K/uL (ref 0.0–0.5)
Eosinophils Relative: 0 %
HCT: 27.5 % — ABNORMAL LOW (ref 39.0–52.0)
Hemoglobin: 8.9 g/dL — ABNORMAL LOW (ref 13.0–17.0)
Immature Granulocytes: 1 %
Lymphocytes Relative: 4 %
Lymphs Abs: 0.3 K/uL — ABNORMAL LOW (ref 0.7–4.0)
MCH: 31.6 pg (ref 26.0–34.0)
MCHC: 32.4 g/dL (ref 30.0–36.0)
MCV: 97.5 fL (ref 80.0–100.0)
Monocytes Absolute: 0.4 K/uL (ref 0.1–1.0)
Monocytes Relative: 6 %
Neutro Abs: 6.3 K/uL (ref 1.7–7.7)
Neutrophils Relative %: 89 %
Platelet Count: 299 K/uL (ref 150–400)
RBC: 2.82 MIL/uL — ABNORMAL LOW (ref 4.22–5.81)
RDW: 17.8 % — ABNORMAL HIGH (ref 11.5–15.5)
WBC Count: 7 K/uL (ref 4.0–10.5)
nRBC: 0 % (ref 0.0–0.2)

## 2024-08-04 LAB — CMP (CANCER CENTER ONLY)
ALT: 23 U/L (ref 0–44)
AST: 29 U/L (ref 15–41)
Albumin: 3.5 g/dL (ref 3.5–5.0)
Alkaline Phosphatase: 127 U/L — ABNORMAL HIGH (ref 38–126)
Anion gap: 8 (ref 5–15)
BUN: 18 mg/dL (ref 8–23)
CO2: 30 mmol/L (ref 22–32)
Calcium: 8.7 mg/dL — ABNORMAL LOW (ref 8.9–10.3)
Chloride: 99 mmol/L (ref 98–111)
Creatinine: 0.76 mg/dL (ref 0.61–1.24)
GFR, Estimated: >60 mL/min (ref >60)
Glucose, Bld: 240 mg/dL — ABNORMAL HIGH (ref 70–99)
Potassium: 4.6 mmol/L (ref 3.5–5.1)
Sodium: 137 mmol/L (ref 135–145)
Total Bilirubin: 0.4 mg/dL (ref 0.0–1.2)
Total Protein: 6 g/dL — ABNORMAL LOW (ref 6.5–8.1)

## 2024-08-04 LAB — SAMPLE TO BLOOD BANK

## 2024-08-04 NOTE — Telephone Encounter (Signed)
 Copied from CRM [id number]. Topic: General - Call Back - No Documentation >> Aug 04, 2024  3:44 PM Rea C wrote: Reason for CRM: Patient is scheduled to have an epidural steroid shot next Wednesday by Dr. Waylon at [facility]. And they are looking for him to stop his Eliquis  three days before the procedure. They are going to send a fax over to Mallie to be approved for patient before his epidural shot next Wednesday. 27th. If someone could keep an eye out for the fax. Patient's spouse would like a call back or message on MyChart to know that it was completed and sent over.   [phone] (Cell- Spouse)

## 2024-08-04 NOTE — Telephone Encounter (Signed)
 Received fax. Mallie is out of the office until 9/02, placing ppw in covering provider inbox, Dr. KANDICE to be completed.

## 2024-08-04 NOTE — Progress Notes (Signed)
 [facility]  New Patient Visit    Referring Provider Lanell Donald Stagger,*   Assessment   Adam Wise is a 73 y.o. male with PMH significant for  has no past medical history on file. referred from Lanell Donald Stagger,* presenting for new patient consultation.   He has a hx of small cell lung cancer in the RUL, diagnosed in 2021. He underwent radiation of the RUL/RML and unfortunately progressed with liver involvement in 2022. He received stereotactic body radiotherapy (SBRT) in 01/2023. He began experiencing pain in his right hip and lower back that resulted in extensive imaging, including right hip xray that revealed moderate hip joint space loss, subchondral sclerosis, and osteophytes. This was followed with a lumbar MRI that showed no concerns for metastasis, but demonstrated severe spinal canal stenosis at L3/4 with severe right foraminal narrowing at this level, moderate canal stenosis at L4/5 and L2/3. There was notable enhancement of the right iliac bone on the lumbar MRI, concerning for malignancy. He elected to complete 9 of 15 planned radiotherapy treatments, but stopped early due to fatigue. He completed additional chemotherapy with topoisomerase inhibitors, however, his follow-up CT C/A/P revealed progression of his disease.   His back pain is his main concern today. He was recently discharged from the hospital for an exacerbation of this pain that reportedly only responded to dilaudid  and he mentions now having some improvement with oral steroids. He denies radiation of his pain into his lower extremities and notes the majority of the pain to be present around L2-4. His exam is notable for facet loading pain bilaterally. He mentions having been to another interventional pain practice several years ago and undergoing what sounds like medial branch blocks, which did not provide significant improvement at that time. Given his lac of improvement with that  intervention, we elected to pursue b/l L3/4 TFESI to address his severe stenosis. If no benefit from this, can revisit plans for b/l L2-4 MBB because it is unclear what vertebral levels were treated and his pain/lower back pathology has changed over the last 6 years.    1. Spinal stenosis of lumbar region without neurogenic claudication      2. Arthropathy of lumbar facet joint      3. Degeneration of intervertebral disc of lumbar region with discogenic back pain        Plan   1.  Urine drug screen was not obtained.  2. [facility] Reporting is reviewed today and consistent with current reported medication and prior physicians.      Narcotic Violations: none Current Medication Regimen -stable Side effects -minimal   Activity Level- adequate [place] Behavior- nil Narcotic Contract- not obtained  3. Opioid risk tool score:   - 1 low risk         OPIOID RISK TOOL       * No data to display              4. Opioid Medications:  - none prescribed. Medications managed by outside Oncologist      5. Adjuvant Medications: - no changes   - Optimize conservative measures including heat/ice therapy to affected site, acetaminophen  and NSAIDs as needed and per label recommendations, and trial OTC lidocaine  patches.  6. Specialized treatments:   - Patient previously completed PT and continues with HEP at least 15-20 mins/day, 3-4 days/week  7.  Imaging:   - Consider updated lumbar MRI with contrast to better evaluate for metastasis  8.  Procedures:  -  b/l L3/4 TFESI  - Then, b/l L2-4 MBB with goal of RFA   - Consider intracept L3, L4, L5. Modic II changes present on MRI  - Risks and benefits of procedure including but not limited to bleeding, infection, nerve damage, and damage to surrounding structures discussed with patient and all questions were answered to his/her satisfaction.   9. Follow up for b/l L3/4 TFESI.  10. The patient's blood pressure was taken and  reported as  130/87   History of Present Illness: Chief Complaint:  Chief Complaint  Patient presents with  . Back Pain    Chronic low back pain   The most significant area of pain is lower back. The pain began several years ago and has been present >3 months. Pain started with: no known injury Patient denies numbness/tingling and denies weakness.   Patient is not incontinent of bowel/bladder. The pain is described as aching, throbbing, cramping, sharp, stabbing, shooting, and stinging. The patient's pain score is rated as 9/10 on average over the last 7 days. The pain improves and is better with rest and medications. The pain is made worse with standing, bending, twisting, lifting, walking, and climbing stairs. The patient is unable to do the following activities 2/2 pain: exercise and house chores.  Denies symptoms including fevers, chills, constipation, nausea, vomiting, lightheadedness, dizziness, or excessive sedation. Denies any side effects to current medication regimen.    Previous Pain Course/Interventions: Has PT been completed for this pain complaint? yes  - Location of PT if completed outside of [facility]: [place] Has chiropractic been completed for this pain complaint? no  Medications utilized:  NSAID's: ibuprofen (Motrin) Muscle Relaxants: cyclobenzaprine  (Flexeril ) Anti-Depressants: none Membrane Stabilizers: gabapentin  (Neurontin ) Opioids: morphine  and oxycodone /acetaminophen  (Percocet) Other: none  Procedures: Patient reports previous visit with Physiatrist 6 years ago where he may have had MBBs, unclear what levels were addressed. Patient denies benefit at that time  Diagnostic Studies: (I personally reviewed all applicable diagnostic imaging pertinent to this visit):   CT Lumbar Spine: 04/26/24 FINDINGS:  Segmentation: 5 lumbar type vertebrae.   Alignment: Lumbar lordosis is maintained.  No significant listhesis.   Vertebrae: No compression  fracture or displaced fracture in the  lumbar spine. Bilateral L5 pars defects. Schmorl's nodes at multiple  levels. Degenerative endplate osteophytes throughout the lower  thoracic and lumbar spine. No destructive osseous lesion.   Paraspinal and other soft tissues: The visualized paraspinal soft  tissues are unremarkable. Similar appearance of pleural density  along the right lung base which may reflect sequelae of prior talc   pleurodesis. Moderate atherosclerosis of the abdominal aorta and  branch vessels. Similar nodularity of the right adrenal gland  previously characterized as adrenal adenoma.   Disc levels: Disc space narrowing at multiple levels in the  visualized lower thoracic spine. Severe disc space narrowing at  T12-L1. Additional moderate disc space narrowing at L3-4 and severe  disc space narrowing at L4-5. Vacuum disc phenomenon at multiple  levels. Disc bulges and posterior osteophytic ridging at multiple  levels. Disc bulge and facet arthrosis at L2-3 resulting in mild  spinal canal stenosis. Disc bulge, facet arthrosis, and thickening  of the ligamentum flavum at L3-4 resulting in severe spinal canal  stenosis. Additional disc bulge and posterior osteophytes along with  facet arthrosis and thickening of the ligamentum flavum at L4-5  resulting in at least moderate spinal canal stenosis. There is  abnormal soft tissue and locules of gas involving the right neural  foramen at  L3-4 which could reflect a large disc extrusion. There is  likely severe foraminal narrowing at this level. Additional moderate  foraminal narrowing on the right at L3-4. Moderate foraminal  narrowing bilaterally at L4-5.   IMPRESSION:  No destructive osseous lesion appreciated. Within limitations of CT  there is no definite osseous metastatic disease in the lumbar spine.  Recommend contrast enhanced MRI lumbar spine for further evaluation.   Degenerative changes of the lumbar spine as above.  Abnormal soft  tissue and locules of gas involving the right neural foramen at L3-4  concerning for large disc extrusion. There is associated severe  foraminal stenosis on the right at L3-4.   Severe spinal canal stenosis at L3-4.   Bilateral pars defects at L5.    MRI Lumbar Spine: 12/13/23 FINDINGS:   Enhancing marrow signal abnormality in right iliac bone [8:3, 9:39].  Modic type II degenerative endplate changes at T11-T12 and T12-L1.  Similar Modic type II degenerative endplate changes at L3-4, and L4-L5.  Scattered small nodes.   The visualized cord is unremarkable.  The conus medullaris ends at a normal level.   Grade I anterior listhesis at L2-L3 and L3-L4.  Grade 1 retrolisthesis at T12-L1.  Trace retrolisthesis at L1-L2.  Vertebral alignment is otherwise unremarkable.   Multilevel disc desiccation and height loss.  Degenerative disc disease in lumbar spine, as follows:SABRA   T12-L1: Diffuse disc bulge, ligamentum flavum thickening and facet hypertrophy contribute to mild spinal canal stenosis, mild left and moderate right neuroforaminal narrowing.   L1-L2: Diffuse disc bulge, with left subarticular protrusion, ligamentum flavum thickening and facet hypertrophy contributing to mild spinal canal stenosis, and mild bilateral neuroforaminal narrowing.   L2-L3: Diffuse disc bulge, with superimposed left subarticular/foraminal protrusion, ligamentum flavum thickening and facet hypertrophy contribute to moderate spinal canal stenosis, and mild bilateral neuroforaminal narrowing.   L3-L4: Bulging disc with superimposed central extrusion and cranial migration, ligamentum flavum thickening and facet arthrosis contribute to severe spinal canal stenosis, severe right, and moderate left neuroforaminal narrowing. Effacement of bilateral lateral recesses. The right exiting nerve root appears markedly thickened [4:7].   L4-L5: Intervertebral disc height loss. Bulging disc with ligamentum  flavum thickening and facet arthropathy contribute to moderate spinal canal stenosis, and moderate bilateral neuroforaminal stenosis. Posterior facet joint cyst at L4-5 on the right.   L5-S1: Bulging disc with superimposed central protrusion, ligamentum flavum thickening and facet arthropathy contribute to mild spinal canal stenosis, and mild bilateral neuroforaminal narrowing.   Enhancement within the intraspinous space at L4-L5.  Enhancement along the posterior aspect of right right lamina at L5 [9:32, 8:7].  Right-sided renal cysts, partially imaged.  Nonspecific bilateral perinephric fat stranding.  Mild fatty atrophy of posterior paraspinal musculature..   For the purposes of this dictation, the lowest well formed intervertebral disc space is assumed to be the L5-S1 level, and there are presumed to be five lumbar-type vertebral bodies.     REVIEW OF SYSTEMS:  A complete 13 point review of systems was performed and was noncontributory except as noted in history of present illness, see intake form from today's examination for full details   Health Status  Current medications: Medication List reviewed and updated today. Current Home Medications  Medication Sig Last Dose  albuterol  sulfate HFA (PROVENTIL ,VENTOLIN ,PROAIR ) 108 (90 Base) MCG/ACT inhaler Inhale two puffs into the lungs every 4 (four) hours as needed for Shortness of Breath.   aspirin  (ECOTRIN LOW DOSE) EC tablet Take one tablet (81 mg dose) by mouth daily.  Coenzyme Q10 (COQ10) 200 MG CAPS Take 200 mg by mouth daily.   cyclobenzaprine  (FLEXERIL ) 10 mg tablet Take one tablet (10 mg dose) by mouth 2 (two) times a day as needed.   docusate sodium  (COLACE,DOK,DOCQLACE) capsule Take one capsule (100 mg dose) by mouth every 12 (twelve) hours.   ELIQUIS  5 MG tablet Take one tablet (5 mg dose) by mouth 2 (two) times daily.   esomeprazole magnesium  (NEXIUM) 20 mg capsule Take one capsule (20 mg dose) by mouth 2 (two) times daily.    gabapentin  (NEURONTIN ) 300 mg capsule Take one capsule (300 mg dose) by mouth.   Insulin  Glargine (BASAGLAR  KWIKPEN) 100 Unit/mL SOPN Inject twenty four Units into the skin.   ipratropium-albuterol  (DUONEB) 0.5-2.5 mg/3 mL ML SOLN nebulizer solution Inhale into the lungs.   losartan  potassium (COZAAR ) 50 mg tablet Take one tablet (50 mg dose) by mouth daily.   metformin  (GLUCOPHAGE ) 1000 MG tablet Take one tablet (1,000 mg dose) by mouth 2 (two) times a day with meals.   morphine  sulfate ER (MS CONTIN ) 30 mg 12 hr tablet Take by mouth.   Multiple Vitamin (MULTI-VITAMIN) tablet Take one tablet by mouth daily.   Multiple Vitamin (MULTI-VITAMIN) tablet Take by mouth daily.   Multiple Vitamin (MULTI-VITAMINS) tablet Take one tablet by mouth 1 (one) time each day with breakfast.   Naloxone  HCl 3 MG/0.1ML LIQD 1 spray by Nasal route.   ondansetron  (ZOFRAN ) 8 mg tablet Take one tablet (8 mg dose) by mouth every 8 (eight) hours as needed.   oxyCODONE -acetaminophen  (PERCOCET,ENDOCET) 10-325 mg per tablet Take one tablet by mouth every 4 (four) hours as needed.   oxymetazoline  (AFRIN) 0.05% nasal spray by Nasal route 2 (two) times daily.   polyethylene glycol (MIRALAX ,GAVILAX,CLEARLAX) 17 GM/SCOOP powder Take 120 mLs (17 g dose) by mouth daily as needed.   testosterone  cypionate (DEPOTESTOTERONE CYPIONATE) 200 MG/ML injection Inject 1 mL (200 mg dose) into the muscle every 14 (fourteen) days.       Problem list: Problem list reviewed and updated at today's visit Problem List[1]    OBJECTIVE: Body mass index is 35.59 kg/m.  Physical Exam:  Vitals:   08/04/24 1023  BP: 130/87  Pulse: 102  Resp: 18  Temp: 97.9 F (36.6 C)  TempSrc: Temporal  SpO2: 99%  Weight: 241 lb (109.3 kg)  Height: 5' 9 (1.753 m)    GENERAL/PSYCHIATRIC:  Patient is alert, oriented, answers questions appropriately. In no apparent distress. Interpersonal behavior socially appropriate. Mood appropriate to  interview. HEENT:  Normocepalic, conjuctiva clear, mucous membranes moist. SKIN:  warm and dry. CARDIOVASCULAR:  Well perfused. No evidence of cyanosis/edema.   RESPIRATORY:  Respirations symetrical and without exertion. ABDOMEN:  soft. EXTREMITIES: Upper and lower extremity motor strength appears to be grossly intact. Grip strength is intact. NEUROLOGICAL:  No gross sensory deficits noted.  SPINE: kyphotic GAIT: antalgic  Sensory: Fine sensation: Normal in UE and LE, Bilaterally symmetric Temperature sensation: Grossly normal in UE and LE, Bilaterally symmetric  Pain (Neuro/Musculoskeletal): Back: decreased range of motion, with paraspinal tenderness, with significant pain with facet loading b/l (spine extension and rotation), without Q/L muscle tenderness, negative straight leg raise bilaterally Sacral:  negative    Patient's pain was assessed, documented as positive, unless stated in the HPI, and a follow up plan has been documented in the Plan. Patient's medication list was reviewed and updated if applicable.  Body Mass Index (BMI) screening was documented and if the patient's BMI was greater than  25 or less than 18.5, the patient was asked to follow up with their PCP for a full assessment regarding weight management. If Fluoroscopy was used during a procedure, the radiation exposure time and number of images were documented within the record.   Treatment plan fully discussed and agreed upon with patient. All questions were answered.  Rockey JONELLE Pae, MD  Note: The following provider note was dictated using computer voice recognition software. There may be inadvertent spelling on misrepresentation of words within this provider note.   Timed Billing: I have personally spent 60 minutes involved in face-to-face and non-face-to-face activities for this patient on the day of the visit.  Professional time spent includes reviewing the medical record, evaluating the patient, independently  interpreting results, coordinating care, and documenting.  I discussed and answered questions regarding the patient's diagnosis along with reviewing imaging findings with the patient, utilizing anatomic spine model for education regarding etiology of pain and interventional therapies, as well as the risks vs. potential benefits of various treatment options for each specific etiology of pain.       [1] Patient Active Problem List Diagnosis  . Spinal stenosis of lumbar region without neurogenic claudication  . Arthropathy of lumbar facet joint  . Degeneration of intervertebral disc of lumbar region with discogenic back pain  *Some images could not be shown.

## 2024-08-04 NOTE — Transitions of Care (Post Inpatient/ED Visit) (Signed)
   08/04/2024  Name: Cashmere Harmes MRN: [id number] DOB: Dec 25, 1950  Today's TOC FU Call Status: Today's TOC FU Call Status:: Unsuccessful Call (2nd Attempt) Unsuccessful Call (2nd Attempt) Date: 08/04/24  Attempted to reach the patient regarding the most recent Inpatient/ED visit.  Follow Up Plan: Additional outreach attempts will be made to reach the patient to complete the Transitions of Care (Post Inpatient/ED visit) call.   Alan Ee, RN, BSN, CEN [employer]- Transition of Care Team.  [facility] [phone]

## 2024-08-05 ENCOUNTER — Ambulatory Visit: Payer: PRIVATE HEALTH INSURANCE

## 2024-08-05 ENCOUNTER — Encounter: Payer: Self-pay | Admitting: Internal Medicine

## 2024-08-05 ENCOUNTER — Telehealth: Payer: Self-pay

## 2024-08-05 ENCOUNTER — Other Ambulatory Visit: Payer: Self-pay | Admitting: Physician Assistant

## 2024-08-05 ENCOUNTER — Telehealth: Payer: Self-pay | Admitting: Medical Oncology

## 2024-08-05 DIAGNOSIS — M48061 Spinal stenosis, lumbar region without neurogenic claudication: Secondary | ICD-10-CM

## 2024-08-05 DIAGNOSIS — G893 Neoplasm related pain (acute) (chronic): Secondary | ICD-10-CM

## 2024-08-05 MED ORDER — LIDOCAINE 5 % EX PTCH: 1.0000 | MEDICATED_PATCH | CUTANEOUS | 0 refills | Status: DC

## 2024-08-05 NOTE — Transitions of Care (Post Inpatient/ED Visit) (Signed)
 08/05/2024  Name: Adam Wise MRN: [id number] DOB: Apr 04, 1951  Today's TOC FU Call Status: Today's TOC FU Call Status:: Successful TOC FU Call Completed TOC FU Call Complete Date: 08/05/24 Patient's Name and Date of Birth confirmed.  Transition Care Management Follow-up Telephone Call How have you been since you were released from the hospital?: Same (still having severe back pain) Any questions or concerns?: No  Items Reviewed: Did you receive and understand the discharge instructions provided?:  (Patient did not want to talk, and handed phone to wife, Adam Wise.) Medications obtained,verified, and reconciled?: Yes (Medications Reviewed) Any new allergies since your discharge?: No Dietary orders reviewed?: Yes Type of Diet Ordered:: carb modified Do you have support at home?: Yes People in Home [RPT]: spouse  Medications Reviewed Today: Medications Reviewed Today     Reviewed by Rumalda Alan PENNER, RN (Registered Nurse) on 08/05/24 at 1435  Med List Status: <None>   Medication Order Taking? Sig Documenting Provider Last Dose Status Informant  albuterol  (VENTOLIN  HFA) 108 (90 Base) MCG/ACT inhaler [id number] Yes Inhale 2 puffs into the lungs every 4 (four) hours as needed for shortness of breath. [name], NP  Active Spouse/Significant Other, Pharmacy Records  B-D 3CC LUER-LOK SYR 22GX1 22G X 1 3 ML MISC [id number] Yes USE AS INSTRUCTED FOR TESTOSTERONE  INJECTION EVERY 2 WEEKS Gretta Katherine K, NP  Active Spouse/Significant Other, Pharmacy Records  Black Pepper-Turmeric (TURMERIC COMPLEX/BLACK PEPPER PO) [id number] Yes Take 1 tablet by mouth in the morning and at bedtime. [provider]  Active Spouse/Significant Other, Pharmacy Records  Calcium Carb-Cholecalciferol (CALCIUM PLUS VITAMIN D3 PO) [id number] Yes Take 1 tablet by mouth in the morning and at bedtime. [provider]  Active   Coenzyme Q10 (COQ10) 200 MG CAPS [id number] Yes Take 200 mg by mouth  daily. [provider]  Active Spouse/Significant Other, Pharmacy Records  cyclobenzaprine  (FLEXERIL ) 10 MG tablet [id number] Yes Take 1 tablet (10 mg total) by mouth 2 (two) times daily as needed for muscle spasms. Ruthe Cornet, DO  Active Spouse/Significant Other, Pharmacy Records  docusate sodium  (COLACE) 100 MG capsule [id number] Yes Take 1 capsule (100 mg total) by mouth every 12 (twelve) hours. Mannie Fairy DASEN, DO  Active Spouse/Significant Other, Pharmacy Records  ELIQUIS  5 MG TABS tablet [id number] Yes TAKE 1 TABLET BY MOUTH TWICE A DAY Heilingoetter, Cassandra L, PA-C  Active Spouse/Significant Other, Pharmacy Records  esomeprazole (NEXIUM) 20 MG capsule [id number] Yes Take 20 mg by mouth in the morning and at bedtime. [provider]  Active Spouse/Significant Other, Pharmacy Records  gabapentin  (NEURONTIN ) 300 MG capsule [id number] Yes Take 1 capsule (300 mg total) by mouth 2 (two) times daily. For back pain. Pickenpack-Cousar, Fannie SAILOR, NP  Active Spouse/Significant Other, Pharmacy Records  glucose blood (ONETOUCH ULTRA) test strip [id number] Yes USE UP TO 4 TIMES DAILY AS DIRECTED Clark, Katherine K, NP  Active Spouse/Significant Other, Pharmacy Records  heparin  lock flush 100 unit/mL [id number]   Sherrod Sherrod, MD  Active   Insulin  Pen Needle (BD PEN NEEDLE NANO U/F) 32G X 4 MM MISC [id number] Yes Use with insulin  as prescribed Dx Code: E11.9 [name], NP  Active Spouse/Significant Other, Pharmacy Records  ipratropium-albuterol  (DUONEB) 0.5-2.5 (3) MG/3ML SOLN [id number] Yes Take 3 mLs by nebulization every 4 (four) hours while awake for 3 days, THEN 3 mLs every 4 (four) hours as needed (shortness of breath or wheezing). [name], NP  Active Spouse/Significant Other, Pharmacy Records  KRILL OIL PO [id number]  Yes Take 1 capsule by mouth daily. [provider]  Active Spouse/Significant Other, Pharmacy Records           Med Note LORNE, SHEFFIELD JAYSON Kitchens Jan 04, 2024  9:54 AM)    Lancets MISC [id number] Yes USE UP TO 3 TIMES DAILY AS DIRECTED Clark, Katherine K, NP  Active Spouse/Significant Other, Pharmacy Records  lidocaine  (LIDODERM ) 5 % [id number]  Place 1 patch onto the skin daily. Remove & Discard patch within 12 hours or as directed by MD  Patient not taking: Reported on 08/05/2024   Albertina Dixon, MD  Active Spouse/Significant Other, Pharmacy Records  losartan  (COZAAR ) 50 MG tablet [id number] Yes TAKE 1 TABLET (50 MG TOTAL) BY MOUTH DAILY. FOR BLOOD PRESSURE. Clark, Katherine K, NP  Active Spouse/Significant Other, Pharmacy Records  metFORMIN  (GLUCOPHAGE ) 1000 MG tablet [id number] Yes TAKE 1 TABLET (1,000 MG TOTAL) BY MOUTH 2 (TWO) TIMES DAILY WITH A MEAL. FOR DIABETES [name], NP  Active Spouse/Significant Other, Pharmacy Records  mirtazapine  (REMERON ) 15 MG tablet [id number] Yes TAKE 1 TABLET BY MOUTH EVERYDAY AT BEDTIME Heilingoetter, Cassandra L, PA-C  Active Spouse/Significant Other, Pharmacy Records  morphine  (MS CONTIN ) 30 MG 12 hr tablet [id number] Yes Take 30 mg by mouth every 8 (eight) hours as needed. [provider]  Active   Multiple Vitamin (MULTI-VITAMINS) TABS [id number] Yes Take 1 tablet by mouth daily with breakfast. [provider]  Active Spouse/Significant Other, Pharmacy Records  Naloxone  HCl 3 MG/0.1ML LIQD [id number]  Place 1 spray into both nostrils once. For known/suspected opiod overdose. Every 2-3 minutes in alternating nostril till EMS arrives.  Patient not taking: Reported on 08/05/2024   [provider]  Active Spouse/Significant Other, Pharmacy Records           Med Note (CRUTHIS, CHLOE C   Mon Aug 01, 2024 10:57 AM) Never used.   ondansetron  (ZOFRAN ) 8 MG tablet [id number] Yes Take 1 tablet (8 mg total) by mouth every 8 (eight) hours as needed for nausea or vomiting. Heilingoetter, Cassandra L, PA-C  Active Spouse/Significant Other, Pharmacy Records  oxyCODONE -acetaminophen   (PERCOCET) 10-325 MG tablet [id number] Yes Take 1 tablet by mouth every 4 (four) hours as needed for up to 10 days. Ruthe Cornet, DO  Active Spouse/Significant Other, Pharmacy Records  pioglitazone  (ACTOS ) 45 MG tablet [id number] Yes TAKE 1 TABLET (45 MG TOTAL) BY MOUTH DAILY. FOR DIABETES. [name], NP  Active Spouse/Significant Other, Pharmacy Records  polyethylene glycol powder (GLYCOLAX /MIRALAX ) 17 GM/SCOOP powder [id number] Yes Take 17 g by mouth daily as needed for moderate constipation or mild constipation. [provider]  Active Spouse/Significant Other, Pharmacy Records  pravastatin  (PRAVACHOL ) 40 MG tablet [id number] Yes TAKE 1 TABLET BY MOUTH EVERY DAY IN THE EVENING FOR CHOLESTEROL Clark, Katherine K, NP  Active Spouse/Significant Other, Pharmacy Records  predniSONE  (DELTASONE ) 50 MG tablet [id number] Yes Take 1 tablet (50 mg total) by mouth daily with breakfast for 4 days. Jillian Buttery, MD  Active   PRESCRIPTION MEDICATION [id number] Yes CPAP- At bedtime [provider]  Active Spouse/Significant Other, Pharmacy Records  sodium chloride  flush (NS) 0.9 % injection 10 mL [id number]   Sherrod Sherrod, MD  Active   testosterone  cypionate (DEPOTESTOSTERONE CYPIONATE) 200 MG/ML injection [id number] Yes Inject 200 mg into the muscle every 14 (fourteen) days. [provider]  Active Spouse/Significant Other, Pharmacy Records            Home Care and Equipment/Supplies: Were Home Health Services Ordered?:  No Any new equipment or medical supplies ordered?: No  Functional Questionnaire: Do you need assistance with bathing/showering or dressing?: No Do you need assistance with meal preparation?: No Do you need assistance with eating?: No Do you have difficulty maintaining continence: No Do you need assistance with getting out of bed/getting out of a chair/moving?: No Do you have difficulty managing or taking your medications?: No  Follow up appointments  reviewed: PCP Follow-up appointment confirmed?: No (Reviewed with wife the need to call MD and make appointment) MD Provider Line [phone] Given: No Specialist Hospital Follow-up appointment confirmed?: Yes Date of Specialist follow-up appointment?: 08/04/24 Follow-Up Specialty Provider:: Cancer center Do you need transportation to your follow-up appointment?: No Do you understand care options if your condition(s) worsen?: Yes-patient verbalized understanding  SDOH Interventions Today    Flowsheet Row Most Recent Value  SDOH Interventions   Food Insecurity Interventions Intervention Not Indicated  Housing Interventions Intervention Not Indicated  Transportation Interventions Intervention Not Indicated  Utilities Interventions Intervention Not Indicated   Patient did not want to talk.  Patient wanted me to speak with wife.  Reviewed medication listed and updated with morphine  Reviewed all discharge instructions and follow up. Reviewed constipation and measures to prevent. Reviewed pain control and suggested a message to MD about Lidoderm  patches as was used inpatient. Reviewed pending epidural injection next week.  Encouraged follow up with PCP. Reviewed DM control and last A1c with wife Reviewed and offered 30 day TOC program. Wife feels very connected with cancer center and will reach out if she needs assistance.  Alan Ee, RN, BSN, CEN [employer]- Transition of Care Team.  [facility] [phone]

## 2024-08-05 NOTE — Telephone Encounter (Signed)
 Form has been faxed to provided fax number.

## 2024-08-05 NOTE — Telephone Encounter (Signed)
 Form filled and in CMA box   Received request from [facility] for anticoagulant management around upcoming surgical procedure: TF ESI under local anesthesia.   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 Would prefer clearance to come from them. Can this be faxed over attn   Cassandra Heilingoetter PA who he saw yesterday? Will forward to her as well.

## 2024-08-05 NOTE — Telephone Encounter (Signed)
 Faxed Pre-procedure clearance to  [facility]

## 2024-08-06 ENCOUNTER — Encounter: Payer: Self-pay | Admitting: Radiation Oncology

## 2024-08-08 ENCOUNTER — Ambulatory Visit

## 2024-08-08 ENCOUNTER — Inpatient Hospital Stay

## 2024-08-08 ENCOUNTER — Telehealth: Payer: Self-pay | Admitting: *Deleted

## 2024-08-08 ENCOUNTER — Other Ambulatory Visit: Payer: PRIVATE HEALTH INSURANCE

## 2024-08-08 NOTE — Telephone Encounter (Signed)
 Late entry  Medication Prior Authorization Status  Processed CoverMyMeds KEY: [id number] for Lidocaine  5% patches  Request has been approved today  Per [employer] Electronic PA   PA Case ID #: [id number]   Effective 12/16/2023 through 08/05/2025.

## 2024-08-09 ENCOUNTER — Other Ambulatory Visit: Payer: Self-pay | Admitting: Nurse Practitioner

## 2024-08-09 ENCOUNTER — Encounter: Payer: Self-pay | Admitting: Internal Medicine

## 2024-08-09 ENCOUNTER — Inpatient Hospital Stay

## 2024-08-09 ENCOUNTER — Inpatient Hospital Stay (HOSPITAL_BASED_OUTPATIENT_CLINIC_OR_DEPARTMENT_OTHER): Admitting: Nurse Practitioner

## 2024-08-09 VITALS — BP 125/77 | HR 99 | Temp 98.0°F | Resp 18

## 2024-08-09 DIAGNOSIS — C3411 Malignant neoplasm of upper lobe, right bronchus or lung: Secondary | ICD-10-CM

## 2024-08-09 DIAGNOSIS — M545 Low back pain, unspecified: Secondary | ICD-10-CM

## 2024-08-09 DIAGNOSIS — C787 Secondary malignant neoplasm of liver and intrahepatic bile duct: Secondary | ICD-10-CM | POA: Diagnosis not present

## 2024-08-09 DIAGNOSIS — Z95828 Presence of other vascular implants and grafts: Secondary | ICD-10-CM

## 2024-08-09 DIAGNOSIS — G893 Neoplasm related pain (acute) (chronic): Secondary | ICD-10-CM | POA: Diagnosis not present

## 2024-08-09 DIAGNOSIS — Z5111 Encounter for antineoplastic chemotherapy: Secondary | ICD-10-CM | POA: Diagnosis not present

## 2024-08-09 DIAGNOSIS — Z515 Encounter for palliative care: Secondary | ICD-10-CM

## 2024-08-09 DIAGNOSIS — D696 Thrombocytopenia, unspecified: Secondary | ICD-10-CM

## 2024-08-09 DIAGNOSIS — M792 Neuralgia and neuritis, unspecified: Secondary | ICD-10-CM

## 2024-08-09 DIAGNOSIS — G8929 Other chronic pain: Secondary | ICD-10-CM

## 2024-08-09 DIAGNOSIS — D649 Anemia, unspecified: Secondary | ICD-10-CM

## 2024-08-09 DIAGNOSIS — C7951 Secondary malignant neoplasm of bone: Secondary | ICD-10-CM | POA: Diagnosis not present

## 2024-08-09 LAB — SAMPLE TO BLOOD BANK

## 2024-08-09 LAB — CBC WITH DIFFERENTIAL (CANCER CENTER ONLY)
Abs Immature Granulocytes: 0.1 K/uL — ABNORMAL HIGH (ref 0.00–0.07)
Basophils Absolute: 0 K/uL (ref 0.0–0.1)
Basophils Relative: 0 %
Eosinophils Absolute: 0 K/uL (ref 0.0–0.5)
Eosinophils Relative: 0 %
HCT: 31.7 % — ABNORMAL LOW (ref 39.0–52.0)
Hemoglobin: 10.2 g/dL — ABNORMAL LOW (ref 13.0–17.0)
Immature Granulocytes: 1 %
Lymphocytes Relative: 4 %
Lymphs Abs: 0.4 K/uL — ABNORMAL LOW (ref 0.7–4.0)
MCH: 31.4 pg (ref 26.0–34.0)
MCHC: 32.2 g/dL (ref 30.0–36.0)
MCV: 97.5 fL (ref 80.0–100.0)
Monocytes Absolute: 0.8 K/uL (ref 0.1–1.0)
Monocytes Relative: 8 %
Neutro Abs: 9.1 K/uL — ABNORMAL HIGH (ref 1.7–7.7)
Neutrophils Relative %: 87 %
Platelet Count: 253 K/uL (ref 150–400)
RBC: 3.25 MIL/uL — ABNORMAL LOW (ref 4.22–5.81)
RDW: 17.8 % — ABNORMAL HIGH (ref 11.5–15.5)
WBC Count: 10.5 K/uL (ref 4.0–10.5)
nRBC: 0 % (ref 0.0–0.2)

## 2024-08-09 LAB — CMP (CANCER CENTER ONLY)
ALT: 29 U/L (ref 0–44)
AST: 30 U/L (ref 15–41)
Albumin: 3.2 g/dL — ABNORMAL LOW (ref 3.5–5.0)
Alkaline Phosphatase: 124 U/L (ref 38–126)
Anion gap: 8 (ref 5–15)
BUN: 23 mg/dL (ref 8–23)
CO2: 29 mmol/L (ref 22–32)
Calcium: 8.5 mg/dL — ABNORMAL LOW (ref 8.9–10.3)
Chloride: 97 mmol/L — ABNORMAL LOW (ref 98–111)
Creatinine: 0.85 mg/dL (ref 0.61–1.24)
GFR, Estimated: >60 mL/min (ref >60)
Glucose, Bld: 205 mg/dL — ABNORMAL HIGH (ref 70–99)
Potassium: 4.2 mmol/L (ref 3.5–5.1)
Sodium: 134 mmol/L — ABNORMAL LOW (ref 135–145)
Total Bilirubin: 0.5 mg/dL (ref 0.0–1.2)
Total Protein: 5.8 g/dL — ABNORMAL LOW (ref 6.5–8.1)

## 2024-08-09 MED ORDER — SODIUM CHLORIDE 0.9% FLUSH
10.0000 mL | Freq: Once | INTRAVENOUS | Status: AC
  Administered 2024-08-09: 10 mL

## 2024-08-09 MED ORDER — APREPITANT 130 MG/18ML IV EMUL
130.0000 mg | Freq: Once | INTRAVENOUS | Status: AC
  Administered 2024-08-09: 130 mg via INTRAVENOUS
  Filled 2024-08-09: qty 18

## 2024-08-09 MED ORDER — SODIUM CHLORIDE 0.9 % IV SOLN: INTRAVENOUS | Status: DC

## 2024-08-09 MED ORDER — PALONOSETRON HCL INJECTION 0.25 MG/5ML
0.2500 mg | Freq: Once | INTRAVENOUS | Status: AC
  Administered 2024-08-09: 0.25 mg via INTRAVENOUS
  Filled 2024-08-09: qty 5

## 2024-08-09 MED ORDER — DIPHENHYDRAMINE HCL 50 MG/ML IJ SOLN
25.0000 mg | Freq: Once | INTRAMUSCULAR | Status: AC
  Administered 2024-08-09: 25 mg via INTRAVENOUS
  Filled 2024-08-09: qty 1

## 2024-08-09 MED ORDER — SODIUM CHLORIDE 0.9 % IV SOLN
491.6000 mg | Freq: Once | INTRAVENOUS | Status: AC
  Administered 2024-08-09: 490 mg via INTRAVENOUS
  Filled 2024-08-09: qty 49

## 2024-08-09 MED ORDER — TRILACICLIB DIHYDROCHLORIDE INJECTION 300 MG
240.0000 mg/m2 | Freq: Once | INTRAVENOUS | Status: AC
  Administered 2024-08-09: 600 mg via INTRAVENOUS
  Filled 2024-08-09: qty 40

## 2024-08-09 MED ORDER — SODIUM CHLORIDE 0.9 % IV SOLN
80.0000 mg/m2 | Freq: Once | INTRAVENOUS | Status: AC
  Administered 2024-08-09: 200 mg via INTRAVENOUS
  Filled 2024-08-09: qty 10

## 2024-08-09 MED ORDER — FAMOTIDINE IN NACL 20-0.9 MG/50ML-% IV SOLN
20.0000 mg | Freq: Once | INTRAVENOUS | Status: AC
  Administered 2024-08-09: 20 mg via INTRAVENOUS
  Filled 2024-08-09: qty 50

## 2024-08-09 MED ORDER — DEXAMETHASONE SODIUM PHOSPHATE 10 MG/ML IJ SOLN
10.0000 mg | Freq: Once | INTRAMUSCULAR | Status: AC
  Administered 2024-08-09: 10 mg via INTRAVENOUS
  Filled 2024-08-09: qty 1

## 2024-08-09 NOTE — Telephone Encounter (Signed)
 Called and advised patients wife that form was sent to oncologist since they are the prescriber for Eliquis . She will f/u back up with them to find out status of form.

## 2024-08-09 NOTE — Telephone Encounter (Signed)
 Copied from CRM #[id number]. Topic: General - Other >> Aug 09, 2024  3:14 PM Mesmerise C wrote: Reason for RMF:Ejupzwu wife Rollene advised patient is having a procedure tomorrow and had to stop takin eliquis , a release form was faxed to have provider sign off on it can reach wife at [phone]

## 2024-08-10 ENCOUNTER — Encounter: Payer: Self-pay | Admitting: Radiation Oncology

## 2024-08-10 ENCOUNTER — Telehealth: Payer: Self-pay | Admitting: Medical Oncology

## 2024-08-10 ENCOUNTER — Inpatient Hospital Stay

## 2024-08-10 VITALS — BP 112/79 | HR 100 | Temp 98.1°F

## 2024-08-10 DIAGNOSIS — C3411 Malignant neoplasm of upper lobe, right bronchus or lung: Secondary | ICD-10-CM | POA: Diagnosis not present

## 2024-08-10 DIAGNOSIS — I2692 Saddle embolus of pulmonary artery without acute cor pulmonale: Secondary | ICD-10-CM

## 2024-08-10 DIAGNOSIS — Z5111 Encounter for antineoplastic chemotherapy: Secondary | ICD-10-CM | POA: Diagnosis not present

## 2024-08-10 DIAGNOSIS — Z794 Long term (current) use of insulin: Secondary | ICD-10-CM | POA: Diagnosis not present

## 2024-08-10 DIAGNOSIS — C7951 Secondary malignant neoplasm of bone: Secondary | ICD-10-CM | POA: Diagnosis not present

## 2024-08-10 DIAGNOSIS — C787 Secondary malignant neoplasm of liver and intrahepatic bile duct: Secondary | ICD-10-CM | POA: Diagnosis not present

## 2024-08-10 DIAGNOSIS — M48061 Spinal stenosis, lumbar region without neurogenic claudication: Secondary | ICD-10-CM | POA: Diagnosis not present

## 2024-08-10 DIAGNOSIS — E119 Type 2 diabetes mellitus without complications: Secondary | ICD-10-CM | POA: Diagnosis not present

## 2024-08-10 DIAGNOSIS — Z515 Encounter for palliative care: Secondary | ICD-10-CM | POA: Diagnosis not present

## 2024-08-10 DIAGNOSIS — G893 Neoplasm related pain (acute) (chronic): Secondary | ICD-10-CM | POA: Diagnosis not present

## 2024-08-10 MED ORDER — GABAPENTIN 300 MG PO CAPS: 300.0000 mg | ORAL_CAPSULE | Freq: Two times a day (BID) | ORAL | 1 refills | Status: DC

## 2024-08-10 MED ORDER — DEXAMETHASONE SODIUM PHOSPHATE 10 MG/ML IJ SOLN
10.0000 mg | Freq: Once | INTRAMUSCULAR | Status: AC
  Administered 2024-08-10: 10 mg via INTRAVENOUS
  Filled 2024-08-10: qty 1

## 2024-08-10 MED ORDER — SODIUM CHLORIDE 0.9 % IV SOLN: INTRAVENOUS | Status: DC

## 2024-08-10 MED ORDER — MORPHINE SULFATE ER 30 MG PO TBCR: 30.0000 mg | EXTENDED_RELEASE_TABLET | Freq: Three times a day (TID) | ORAL | 0 refills | Status: DC | PRN

## 2024-08-10 MED ORDER — OXYCODONE-ACETAMINOPHEN 10-325 MG PO TABS: 1.0000 | ORAL_TABLET | ORAL | 0 refills | Status: DC | PRN

## 2024-08-10 MED ORDER — SODIUM CHLORIDE 0.9% FLUSH
10.0000 mL | INTRAVENOUS | Status: DC | PRN
  Administered 2024-08-10: 10 mL

## 2024-08-10 MED ORDER — TRILACICLIB DIHYDROCHLORIDE INJECTION 300 MG
240.0000 mg/m2 | Freq: Once | INTRAVENOUS | Status: AC
  Administered 2024-08-10: 600 mg via INTRAVENOUS
  Filled 2024-08-10: qty 40

## 2024-08-10 MED ORDER — SODIUM CHLORIDE 0.9 % IV SOLN
80.0000 mg/m2 | Freq: Once | INTRAVENOUS | Status: AC
  Administered 2024-08-10: 200 mg via INTRAVENOUS
  Filled 2024-08-10: qty 10

## 2024-08-10 NOTE — Patient Instructions (Signed)
 [facility] - A DEPT OF [facility]  Discharge Instructions: Thank you for choosing [facility] to provide your oncology and hematology care.   If you have a lab appointment with the Cancer Center, please go directly to the Cancer Center and check in at the registration area.   Wear comfortable clothing and clothing appropriate for easy access to any Portacath or PICC line.   We strive to give you quality time with your provider. You may need to reschedule your appointment if you arrive late (15 or more minutes).  Arriving late affects you and other patients whose appointments are after yours.  Also, if you miss three or more appointments without notifying the office, you may be dismissed from the clinic at the provider's discretion.      For prescription refill requests, have your pharmacy contact our office and allow 72 hours for refills to be completed.    Today you received the following chemotherapy and/or immunotherapy agents: Etoposide      To help prevent nausea and vomiting after your treatment, we encourage you to take your nausea medication as directed.  BELOW ARE SYMPTOMS THAT SHOULD BE REPORTED IMMEDIATELY: *FEVER GREATER THAN 100.4 F (38 C) OR HIGHER *CHILLS OR SWEATING *NAUSEA AND VOMITING THAT IS NOT CONTROLLED WITH YOUR NAUSEA MEDICATION *UNUSUAL SHORTNESS OF BREATH *UNUSUAL BRUISING OR BLEEDING *URINARY PROBLEMS (pain or burning when urinating, or frequent urination) *BOWEL PROBLEMS (unusual diarrhea, constipation, pain near the anus) TENDERNESS IN MOUTH AND THROAT WITH OR WITHOUT PRESENCE OF ULCERS (sore throat, sores in mouth, or a toothache) UNUSUAL RASH, SWELLING OR PAIN  UNUSUAL VAGINAL DISCHARGE OR ITCHING   Items with * indicate a potential emergency and should be followed up as soon as possible or go to the Emergency Department if any problems should occur.  Please show the CHEMOTHERAPY ALERT CARD or IMMUNOTHERAPY  ALERT CARD at check-in to the Emergency Department and triage nurse.  Should you have questions after your visit or need to cancel or reschedule your appointment, please contact [facility] - A DEPT OF [facility]  Dept: [phone]  and follow the prompts.  Office hours are 8:00 a.m. to 4:30 p.m. Monday - Friday. Please note that voicemails left after 4:00 p.m. may not be returned until the following business day.  We are closed weekends and major holidays. You have access to a nurse at all times for urgent questions. Please call the main number to the clinic Dept: [phone] and follow the prompts.   For any non-urgent questions, you may also contact your provider using MyChart. We now offer e-Visits for anyone 35 and older to request care online for non-urgent symptoms. For details visit [email or web address].   Also download the MyChart app! Go to the app store, search MyChart, open the app, select [facility], and log in with your MyChart username and password.

## 2024-08-10 NOTE — Telephone Encounter (Signed)
 Eloquis management- Staff at [facility] said PCP told pt  to hold eloquis 3 days before Epidural injection  and to resume it the day after the procedure. Nothing needed from Dr Sherrod.

## 2024-08-11 ENCOUNTER — Inpatient Hospital Stay

## 2024-08-11 VITALS — BP 128/87 | HR 95 | Temp 97.7°F | Resp 17

## 2024-08-11 DIAGNOSIS — G893 Neoplasm related pain (acute) (chronic): Secondary | ICD-10-CM | POA: Diagnosis not present

## 2024-08-11 DIAGNOSIS — C787 Secondary malignant neoplasm of liver and intrahepatic bile duct: Secondary | ICD-10-CM | POA: Diagnosis not present

## 2024-08-11 DIAGNOSIS — Z5111 Encounter for antineoplastic chemotherapy: Secondary | ICD-10-CM | POA: Diagnosis not present

## 2024-08-11 DIAGNOSIS — Z515 Encounter for palliative care: Secondary | ICD-10-CM | POA: Diagnosis not present

## 2024-08-11 DIAGNOSIS — C3411 Malignant neoplasm of upper lobe, right bronchus or lung: Secondary | ICD-10-CM | POA: Diagnosis not present

## 2024-08-11 DIAGNOSIS — C7951 Secondary malignant neoplasm of bone: Secondary | ICD-10-CM | POA: Diagnosis not present

## 2024-08-11 MED ORDER — TRILACICLIB DIHYDROCHLORIDE INJECTION 300 MG
240.0000 mg/m2 | Freq: Once | INTRAVENOUS | Status: AC
  Administered 2024-08-11: 600 mg via INTRAVENOUS
  Filled 2024-08-11: qty 40

## 2024-08-11 MED ORDER — SODIUM CHLORIDE 0.9 % IV SOLN
80.0000 mg/m2 | Freq: Once | INTRAVENOUS | Status: AC
  Administered 2024-08-11: 200 mg via INTRAVENOUS
  Filled 2024-08-11: qty 10

## 2024-08-11 MED ORDER — SODIUM CHLORIDE 0.9 % IV SOLN: INTRAVENOUS | Status: DC

## 2024-08-11 MED ORDER — DEXAMETHASONE SODIUM PHOSPHATE 10 MG/ML IJ SOLN
10.0000 mg | Freq: Once | INTRAMUSCULAR | Status: AC
  Administered 2024-08-11: 10 mg via INTRAVENOUS
  Filled 2024-08-11: qty 1

## 2024-08-12 ENCOUNTER — Inpatient Hospital Stay

## 2024-08-13 ENCOUNTER — Emergency Department (HOSPITAL_COMMUNITY)

## 2024-08-13 ENCOUNTER — Other Ambulatory Visit: Payer: Self-pay

## 2024-08-13 ENCOUNTER — Emergency Department (HOSPITAL_COMMUNITY)
Admission: EM | Admit: 2024-08-13 | Discharge: 2024-08-13 | Disposition: A | Discharge status: Home / Self Care | Attending: Emergency Medicine | Admitting: Emergency Medicine

## 2024-08-13 ENCOUNTER — Encounter (HOSPITAL_COMMUNITY): Payer: Self-pay | Admitting: Emergency Medicine

## 2024-08-13 DIAGNOSIS — R531 Weakness: Secondary | ICD-10-CM | POA: Diagnosis not present

## 2024-08-13 DIAGNOSIS — Z7901 Long term (current) use of anticoagulants: Secondary | ICD-10-CM | POA: Diagnosis not present

## 2024-08-13 DIAGNOSIS — M4306 Spondylolysis, lumbar region: Secondary | ICD-10-CM | POA: Insufficient documentation

## 2024-08-13 DIAGNOSIS — J9811 Atelectasis: Secondary | ICD-10-CM | POA: Diagnosis not present

## 2024-08-13 DIAGNOSIS — R000 Tachycardia, unspecified: Secondary | ICD-10-CM | POA: Diagnosis not present

## 2024-08-13 DIAGNOSIS — M5442 Lumbago with sciatica, left side: Secondary | ICD-10-CM | POA: Insufficient documentation

## 2024-08-13 DIAGNOSIS — R918 Other nonspecific abnormal finding of lung field: Secondary | ICD-10-CM | POA: Insufficient documentation

## 2024-08-13 DIAGNOSIS — D649 Anemia, unspecified: Secondary | ICD-10-CM | POA: Insufficient documentation

## 2024-08-13 DIAGNOSIS — C787 Secondary malignant neoplasm of liver and intrahepatic bile duct: Secondary | ICD-10-CM | POA: Insufficient documentation

## 2024-08-13 DIAGNOSIS — R0602 Shortness of breath: Secondary | ICD-10-CM | POA: Diagnosis present

## 2024-08-13 DIAGNOSIS — J984 Other disorders of lung: Secondary | ICD-10-CM | POA: Diagnosis not present

## 2024-08-13 DIAGNOSIS — K769 Liver disease, unspecified: Secondary | ICD-10-CM | POA: Diagnosis not present

## 2024-08-13 DIAGNOSIS — I700 Atherosclerosis of aorta: Secondary | ICD-10-CM | POA: Insufficient documentation

## 2024-08-13 DIAGNOSIS — M6281 Muscle weakness (generalized): Secondary | ICD-10-CM | POA: Insufficient documentation

## 2024-08-13 DIAGNOSIS — Z7969 Long term (current) use of other immunomodulators and immunosuppressants: Secondary | ICD-10-CM | POA: Insufficient documentation

## 2024-08-13 DIAGNOSIS — I7121 Aneurysm of the ascending aorta, without rupture: Secondary | ICD-10-CM | POA: Diagnosis not present

## 2024-08-13 DIAGNOSIS — Z86711 Personal history of pulmonary embolism: Secondary | ICD-10-CM | POA: Diagnosis not present

## 2024-08-13 DIAGNOSIS — C3481 Malignant neoplasm of overlapping sites of right bronchus and lung: Secondary | ICD-10-CM | POA: Diagnosis not present

## 2024-08-13 DIAGNOSIS — J449 Chronic obstructive pulmonary disease, unspecified: Secondary | ICD-10-CM | POA: Insufficient documentation

## 2024-08-13 DIAGNOSIS — R1030 Lower abdominal pain, unspecified: Secondary | ICD-10-CM | POA: Diagnosis present

## 2024-08-13 DIAGNOSIS — Z7409 Other reduced mobility: Secondary | ICD-10-CM | POA: Diagnosis not present

## 2024-08-13 DIAGNOSIS — M5441 Lumbago with sciatica, right side: Secondary | ICD-10-CM | POA: Insufficient documentation

## 2024-08-13 DIAGNOSIS — J929 Pleural plaque without asbestos: Secondary | ICD-10-CM | POA: Diagnosis not present

## 2024-08-13 DIAGNOSIS — G8929 Other chronic pain: Secondary | ICD-10-CM | POA: Insufficient documentation

## 2024-08-13 DIAGNOSIS — M48061 Spinal stenosis, lumbar region without neurogenic claudication: Secondary | ICD-10-CM | POA: Diagnosis not present

## 2024-08-13 DIAGNOSIS — R509 Fever, unspecified: Secondary | ICD-10-CM | POA: Diagnosis not present

## 2024-08-13 DIAGNOSIS — Z85118 Personal history of other malignant neoplasm of bronchus and lung: Secondary | ICD-10-CM | POA: Insufficient documentation

## 2024-08-13 DIAGNOSIS — R231 Pallor: Secondary | ICD-10-CM | POA: Diagnosis not present

## 2024-08-13 DIAGNOSIS — C3490 Malignant neoplasm of unspecified part of unspecified bronchus or lung: Secondary | ICD-10-CM | POA: Diagnosis not present

## 2024-08-13 DIAGNOSIS — R310 Gross hematuria: Secondary | ICD-10-CM | POA: Diagnosis present

## 2024-08-13 DIAGNOSIS — M5126 Other intervertebral disc displacement, lumbar region: Secondary | ICD-10-CM | POA: Diagnosis not present

## 2024-08-13 DIAGNOSIS — J479 Bronchiectasis, uncomplicated: Secondary | ICD-10-CM | POA: Diagnosis not present

## 2024-08-13 LAB — COMPREHENSIVE METABOLIC PANEL WITH GFR
ALT: 49 U/L — ABNORMAL HIGH (ref 0–44)
AST: 39 U/L (ref 15–41)
Albumin: 3.4 g/dL — ABNORMAL LOW (ref 3.5–5.0)
Alkaline Phosphatase: 138 U/L — ABNORMAL HIGH (ref 38–126)
Anion gap: 14 (ref 5–15)
BUN: 26 mg/dL — ABNORMAL HIGH (ref 8–23)
CO2: 22 mmol/L (ref 22–32)
Calcium: 8.9 mg/dL (ref 8.9–10.3)
Chloride: 96 mmol/L — ABNORMAL LOW (ref 98–111)
Creatinine, Ser: 0.85 mg/dL (ref 0.61–1.24)
GFR, Estimated: >60 mL/min (ref >60)
Glucose, Bld: 179 mg/dL — ABNORMAL HIGH (ref 70–99)
Potassium: 4.5 mmol/L (ref 3.5–5.1)
Sodium: 131 mmol/L — ABNORMAL LOW (ref 135–145)
Total Bilirubin: 0.6 mg/dL (ref 0.0–1.2)
Total Protein: 5.7 g/dL — ABNORMAL LOW (ref 6.5–8.1)

## 2024-08-13 LAB — CBC
HCT: 31.1 % — ABNORMAL LOW (ref 39.0–52.0)
Hemoglobin: 9.7 g/dL — ABNORMAL LOW (ref 13.0–17.0)
MCH: 30.8 pg (ref 26.0–34.0)
MCHC: 31.2 g/dL (ref 30.0–36.0)
MCV: 98.7 fL (ref 80.0–100.0)
Platelets: 162 K/uL (ref 150–400)
RBC: 3.15 MIL/uL — ABNORMAL LOW (ref 4.22–5.81)
RDW: 17.2 % — ABNORMAL HIGH (ref 11.5–15.5)
WBC: 8.3 K/uL (ref 4.0–10.5)
nRBC: 0 % (ref 0.0–0.2)

## 2024-08-13 LAB — I-STAT CHEM 8, ED
BUN: 26 mg/dL — ABNORMAL HIGH (ref 8–23)
Calcium, Ion: 1.18 mmol/L (ref 1.15–1.40)
Chloride: 97 mmol/L — ABNORMAL LOW (ref 98–111)
Creatinine, Ser: 0.9 mg/dL (ref 0.61–1.24)
Glucose, Bld: 179 mg/dL — ABNORMAL HIGH (ref 70–99)
HCT: 28 % — ABNORMAL LOW (ref 39.0–52.0)
Hemoglobin: 9.5 g/dL — ABNORMAL LOW (ref 13.0–17.0)
Potassium: 4.2 mmol/L (ref 3.5–5.1)
Sodium: 132 mmol/L — ABNORMAL LOW (ref 135–145)
TCO2: 23 mmol/L (ref 22–32)

## 2024-08-13 LAB — URINALYSIS, ROUTINE W REFLEX MICROSCOPIC
Bacteria, UA: NONE SEEN
Bilirubin Urine: NEGATIVE
Glucose, UA: NEGATIVE mg/dL
Ketones, ur: NEGATIVE mg/dL
Leukocytes,Ua: NEGATIVE
Nitrite: NEGATIVE
Protein, ur: NEGATIVE mg/dL
Specific Gravity, Urine: 1.017 (ref 1.005–1.030)
pH: 5 (ref 5.0–8.0)

## 2024-08-13 LAB — PROTIME-INR
INR: 1 (ref 0.8–1.2)
Prothrombin Time: 14 s (ref 11.4–15.2)

## 2024-08-13 LAB — TROPONIN T, HIGH SENSITIVITY
Troponin T High Sensitivity: 55 ng/L — ABNORMAL HIGH (ref 0–19)
Troponin T High Sensitivity: 49 ng/L — ABNORMAL HIGH (ref 0–19)

## 2024-08-13 LAB — LACTIC ACID, PLASMA
Lactic Acid, Venous: 1 mmol/L (ref 0.5–1.9)
Lactic Acid, Venous: 1.1 mmol/L (ref 0.5–1.9)

## 2024-08-13 LAB — CBG MONITORING, ED: Glucose-Capillary: 158 mg/dL — ABNORMAL HIGH (ref 70–99)

## 2024-08-13 MED ORDER — HYDROMORPHONE HCL 1 MG/ML IJ SOLN
1.0000 mg | Freq: Once | INTRAMUSCULAR | Status: AC
  Administered 2024-08-13: 1 mg via INTRAVENOUS
  Filled 2024-08-13: qty 1

## 2024-08-13 MED ORDER — IOHEXOL 350 MG/ML SOLN
100.0000 mL | Freq: Once | INTRAVENOUS | Status: AC | PRN
  Administered 2024-08-13: 100 mL via INTRAVENOUS

## 2024-08-13 MED ORDER — GADOBUTROL 1 MMOL/ML IV SOLN
10.0000 mL | Freq: Once | INTRAVENOUS | Status: AC | PRN
  Administered 2024-08-13: 10 mL via INTRAVENOUS

## 2024-08-13 MED ORDER — MORPHINE SULFATE (PF) 4 MG/ML IV SOLN
4.0000 mg | Freq: Once | INTRAVENOUS | Status: AC
  Administered 2024-08-13: 4 mg via INTRAVENOUS
  Filled 2024-08-13: qty 1

## 2024-08-13 MED ORDER — SODIUM CHLORIDE 0.9 % IV BOLUS
500.0000 mL | Freq: Once | INTRAVENOUS | Status: AC
  Administered 2024-08-13: 500 mL via INTRAVENOUS

## 2024-08-13 NOTE — ED Provider Triage Note (Signed)
 Emergency Medicine Provider Triage Evaluation Note  Wilfred Dayrit , a 73 y.o. male  was evaluated in triage.  Pt complains of hematuria, passing blood clots, noticed this morning.  Noted to have had increased weakness and fatigue x 2 weeks.  Noted to have had increased chest pain, worsening shortness of breath particularly on exertion, general abdominal pain.   Notably has history of stage IV lung cancer.  Review of Systems  Positive: N/a Negative: N/a  Physical Exam  BP 119/82   Pulse (!) 131   Temp 98.2 F (36.8 C) (Oral)   Resp 16   SpO2 96%  Gen:   Awake, no distress   Resp:  Normal effort  MSK:   Moves extremities without difficulty  Other:    Medical Decision Making  Medically screening exam initiated at 4:12 PM.  Appropriate orders placed.  Daevon Holdren was informed that the remainder of the evaluation will be completed by another provider, this initial triage assessment does not replace that evaluation, and the importance of remaining in the ED until their evaluation is complete.     Beola Terrall RAMAN, [place] 08/13/24 [phone]

## 2024-08-13 NOTE — Discharge Instructions (Signed)
 Make a follow-up appointment with your oncologist.  You will need to have the aortic aneurysm periodically rechecked.  Discussed with your oncologist or primary care doctor.  Return to the emergency room if you have any worsening symptoms.

## 2024-08-13 NOTE — ED Triage Notes (Signed)
 Patient presents from home due to increased weakness and decreased mobility for 2 weeks. He has also had urinary incontinence with hematuria (passing clots). EMS reports he is hot and pale.    HX Stage 4 lung cancer  EMS vitals: 112 HR 120/80 BP 202 CBG 20 RR 90% SPO2 on room air 95% SPO2 on 2 L O 2

## 2024-08-13 NOTE — ED Provider Notes (Signed)
 [place] EMERGENCY DEPARTMENT AT [facility] Provider Note   CSN: [id number] Arrival date & time: 08/13/24  1534     Patient presents with: Hematuria and Weakness   Adam Wise is a 73 y.o. male.   Patient is a 73 year old male with a history of COPD, pulmonary embolus on Eliquis , chronic back pain metastatic lung cancer currently undergoing palliative chemotherapy who presents with hematuria.  He notes some gross hematuria with clots that started this morning.  He has had some increased weakness and fatigue.  Wife states that she noticed there is a blood clot in the toilet after he had urinated and there was some blood on his diaper that he wears at night.  She has not noticed any other blood other than the 1 episode.  He denies any abdominal pain.  He has had some increased weakness over the last few days although wife says this is pretty much baseline for him.  He also reports increased shortness of breath.  No associated chest pain.  He was off his Eliquis  for recent spinal injection for pain management of his chronic back pain.  This was last week.  He was off of it for 4 days.  He started back on it 2 days ago.  He does report that he has had some new numbness in both of his legs.  He cannot tell me exactly when this started.  The wife is noted that he is urinating on himself during the night.  He has had some loss of bladder control during the daytime but it seems to be more of an urgency situation where he can get into the bathroom in time.  He says that he has a sense of when he needs to urinate.  No loss of control of his urine or bowels.  He reports weakness in his leg but it is difficult to ascertain whether he is just weak all over or just in his legs.  He denies any recent fevers.  No cough or cold symptoms.       Prior to Admission medications   Medication Sig Start Date End Date Taking? Authorizing Provider  albuterol  (VENTOLIN  HFA) 108 (90 Base) MCG/ACT inhaler  Inhale 2 puffs into the lungs every 4 (four) hours as needed for shortness of breath. 05/30/24   [name], NP  B-D 3CC LUER-LOK SYR 22GX1 22G X 1 3 ML MISC USE AS INSTRUCTED FOR TESTOSTERONE  INJECTION EVERY 2 WEEKS 03/20/21   Clark, Katherine K, NP  Black Pepper-Turmeric (TURMERIC COMPLEX/BLACK PEPPER PO) Take 1 tablet by mouth in the morning and at bedtime.    [provider]  Calcium Carb-Cholecalciferol (CALCIUM PLUS VITAMIN D3 PO) Take 1 tablet by mouth in the morning and at bedtime.    [provider]  Coenzyme Q10 (COQ10) 200 MG CAPS Take 200 mg by mouth daily.    [provider]  cyclobenzaprine  (FLEXERIL ) 10 MG tablet Take 1 tablet (10 mg total) by mouth 2 (two) times daily as needed for muscle spasms. 07/29/24   Curatolo, Adam, DO  docusate sodium  (COLACE) 100 MG capsule Take 1 capsule (100 mg total) by mouth every 12 (twelve) hours. 08/02/23   Mannie Pac T, DO  ELIQUIS  5 MG TABS tablet TAKE 1 TABLET BY MOUTH TWICE A DAY 12/06/23   Heilingoetter, Cassandra L, PA-C  esomeprazole (NEXIUM) 20 MG capsule Take 20 mg by mouth in the morning and at bedtime.    [provider]  gabapentin  (NEURONTIN )  300 MG capsule Take 1 capsule (300 mg total) by mouth 2 (two) times daily. For back pain. 08/10/24   Pickenpack-Cousar, Athena N, NP  glucose blood (ONETOUCH ULTRA) test strip USE UP TO 4 TIMES DAILY AS DIRECTED 07/29/22   Clark, Katherine K, NP  Insulin  Pen Needle (BD PEN NEEDLE NANO U/F) 32G X 4 MM MISC Use with insulin  as prescribed Dx Code: E11.9 05/20/23   [name], NP  ipratropium-albuterol  (DUONEB) 0.5-2.5 (3) MG/3ML SOLN Take 3 mLs by nebulization every 4 (four) hours while awake for 3 days, THEN 3 mLs every 4 (four) hours as needed (shortness of breath or wheezing). 01/07/24 08/10/24  Clark, Katherine K, NP  KRILL OIL PO Take 1 capsule by mouth daily.    [provider]  Lancets MISC USE UP TO 3 TIMES DAILY AS DIRECTED 05/06/18   Clark,  Katherine K, NP  lidocaine  (LIDODERM ) 5 % Place 1 patch onto the skin daily. Remove & Discard patch within 12 hours or as directed by MD 08/05/24   Heilingoetter, Cassandra L, PA-C  losartan  (COZAAR ) 50 MG tablet TAKE 1 TABLET (50 MG TOTAL) BY MOUTH DAILY. FOR BLOOD PRESSURE. 08/20/23   Clark, Katherine K, NP  metFORMIN  (GLUCOPHAGE ) 1000 MG tablet TAKE 1 TABLET (1,000 MG TOTAL) BY MOUTH 2 (TWO) TIMES DAILY WITH A MEAL. FOR DIABETES 07/15/24   [name], NP  mirtazapine  (REMERON ) 15 MG tablet TAKE 1 TABLET BY MOUTH EVERYDAY AT BEDTIME 02/28/24   Heilingoetter, Cassandra L, PA-C  morphine  (MS CONTIN ) 30 MG 12 hr tablet Take 1 tablet (30 mg total) by mouth every 8 (eight) hours as needed. 08/10/24   Pickenpack-Cousar, Athena N, NP  Multiple Vitamin (MULTI-VITAMINS) TABS Take 1 tablet by mouth daily with breakfast.    [provider]  Naloxone  HCl 3 MG/0.1ML LIQD Place 1 spray into both nostrils once. For known/suspected opiod overdose. Every 2-3 minutes in alternating nostril till EMS arrives. 10/06/23   [provider]  ondansetron  (ZOFRAN ) 8 MG tablet Take 1 tablet (8 mg total) by mouth every 8 (eight) hours as needed for nausea or vomiting. 07/11/24   Heilingoetter, Cassandra L, PA-C  oxyCODONE -acetaminophen  (PERCOCET) 10-325 MG tablet Take 1-1.5 tablets by mouth every 4 (four) hours as needed for pain. 08/10/24   Pickenpack-Cousar, Athena N, NP  pioglitazone  (ACTOS ) 45 MG tablet TAKE 1 TABLET (45 MG TOTAL) BY MOUTH DAILY. FOR DIABETES. 02/14/24   Clark, Katherine K, NP  polyethylene glycol powder (GLYCOLAX /MIRALAX ) 17 GM/SCOOP powder Take 17 g by mouth daily as needed for moderate constipation or mild constipation.    [provider]  pravastatin  (PRAVACHOL ) 40 MG tablet TAKE 1 TABLET BY MOUTH EVERY DAY IN THE EVENING FOR CHOLESTEROL 08/21/23   Clark, Katherine K, NP  PRESCRIPTION MEDICATION CPAP- At bedtime    [provider]  testosterone  cypionate (DEPOTESTOSTERONE  CYPIONATE) 200 MG/ML injection Inject 200 mg into the muscle every 14 (fourteen) days.    [provider]    Allergies: Bupropion and Hydrochlorothiazide    Review of Systems  Constitutional:  Positive for fatigue. Negative for chills, diaphoresis and fever.  HENT:  Negative for congestion, rhinorrhea and sneezing.   Eyes: Negative.   Respiratory:  Positive for shortness of breath. Negative for cough and chest tightness.   Cardiovascular:  Negative for chest pain and leg swelling.  Gastrointestinal:  Negative for abdominal pain, diarrhea, nausea and vomiting.  Genitourinary:  Positive for frequency, hematuria and urgency. Negative for difficulty urinating  and flank pain.  Musculoskeletal:  Positive for back pain. Negative for arthralgias.  Skin:  Negative for rash.  Neurological:  Positive for weakness and numbness. Negative for dizziness, speech difficulty and headaches.    Updated Vital Signs BP 119/84   Pulse 100   Temp 98.7 F (37.1 C) (Oral)   Resp 20   SpO2 93%   Physical Exam Constitutional:      Appearance: He is well-developed. He is obese.  HENT:     Head: Normocephalic and atraumatic.  Eyes:     Pupils: Pupils are equal, round, and reactive to light.  Cardiovascular:     Rate and Rhythm: Normal rate and regular rhythm.     Heart sounds: Normal heart sounds.  Pulmonary:     Effort: Pulmonary effort is normal. No respiratory distress.     Breath sounds: Normal breath sounds. No wheezing or rales.     Comments: Mild tachypnea, pulse ox 91% on room air Chest:     Chest wall: No tenderness.  Abdominal:     General: Bowel sounds are normal.     Palpations: Abdomen is soft.     Tenderness: There is abdominal tenderness (Generalized tenderness). There is no guarding or rebound.  Musculoskeletal:        General: Normal range of motion.     Cervical back: Normal range of motion and neck supple.  Lymphadenopathy:     Cervical: No cervical adenopathy.   Skin:    General: Skin is warm and dry.     Findings: No rash.  Neurological:     Mental Status: He is alert and oriented to person, place, and time.     Comments: Motor 4 out of 5 to lower extremities bilaterally, 5 out of 5 to the upper extremities bilaterally, sensation grossly intact to light touch all extremities     (all labs ordered are listed, but only abnormal results are displayed) Labs Reviewed  URINALYSIS, ROUTINE W REFLEX MICROSCOPIC - Abnormal; Notable for the following components:      Result Value   Hgb urine dipstick MODERATE (*)    All other components within normal limits  COMPREHENSIVE METABOLIC PANEL WITH GFR - Abnormal; Notable for the following components:   Sodium 131 (*)    Chloride 96 (*)    Glucose, Bld 179 (*)    BUN 26 (*)    Total Protein 5.7 (*)    Albumin 3.4 (*)    ALT 49 (*)    Alkaline Phosphatase 138 (*)    All other components within normal limits  CBC - Abnormal; Notable for the following components:   RBC 3.15 (*)    Hemoglobin 9.7 (*)    HCT 31.1 (*)    RDW 17.2 (*)    All other components within normal limits  CBG MONITORING, ED - Abnormal; Notable for the following components:   Glucose-Capillary 158 (*)    All other components within normal limits  I-STAT CHEM 8, ED - Abnormal; Notable for the following components:   Sodium 132 (*)    Chloride 97 (*)    BUN 26 (*)    Glucose, Bld 179 (*)    Hemoglobin 9.5 (*)    HCT 28.0 (*)    All other components within normal limits  TROPONIN T, HIGH SENSITIVITY - Abnormal; Notable for the following components:   Troponin T High Sensitivity 55 (*)    All other components within normal limits  TROPONIN T, HIGH SENSITIVITY -  Abnormal; Notable for the following components:   Troponin T High Sensitivity 49 (*)    All other components within normal limits  PROTIME-INR  LACTIC ACID, PLASMA  LACTIC ACID, PLASMA    EKG: EKG Interpretation Date/Time:  Saturday August 13 2024 16:35:11  EDT Ventricular Rate:  122 PR Interval:  142 QRS Duration:  82 QT Interval:  315 QTC Calculation: 445 R Axis:   21  Text Interpretation: probable sinus tachycardia with PACs Multiple ventricular premature complexes Low voltage, precordial leads Minimal ST elevation, inferior leads Confirmed by Lenor Hollering [phone]) on 08/13/2024 7:27:08 PM  Radiology: CT Angio Chest PE W/Cm &/Or Wo Cm Result Date: 08/13/2024 CLINICAL DATA:  Pulmonary embolism (PE) suspected, high prob; gross hematuria, lower abd pain. History of metastatic small cell lung cancer. EXAM: CT ANGIOGRAPHY CHEST CT ABDOMEN AND PELVIS WITH CONTRAST TECHNIQUE: Multidetector CT imaging of the chest was performed using the standard protocol during bolus administration of intravenous contrast. Multiplanar CT image reconstructions and MIPs were obtained to evaluate the vascular anatomy. Multidetector CT imaging of the abdomen and pelvis was performed using the standard protocol during bolus administration of intravenous contrast. RADIATION DOSE REDUCTION: This exam was performed according to the departmental dose-optimization program which includes automated exposure control, adjustment of the mA and/or kV according to patient size and/or use of iterative reconstruction technique. CONTRAST:  OMNIPAQUE  IOHEXOL  350 MG/ML SOLN COMPARISON:  CT angio chest 07/31/2024, CT chest abdomen pelvis 03/08/2024, CT abdomen pelvis 08/02/2023 FINDINGS: CTA CHEST FINDINGS Cardiovascular: Satisfactory opacification of the pulmonary arteries to the segmental level. Nonvisualization of the right upper lobe subsegmental pulmonary arteries that may be due to obstruction or shunting in the setting of right upper quadrant consolidation/mass versus less likely pulmonary embolus. The main pulmonary artery is enlarged in caliber measuring up to 3.8 cm. Normal heart size. No significant pericardial effusion. Ascending thoracic aorta is stable in caliber measuring up to 4  cm. Normal caliber descending thoracic aorta. Moderate atherosclerotic plaque of the thoracic aorta. No coronary artery calcifications. Mediastinum/Nodes: Right hilar soft tissue density extending to the subcarinal region. No distinct enlarged mediastinal, left hilar, or axillary lymph nodes. Thyroid  gland, trachea, and esophagus demonstrate no significant findings. Lungs/Pleura: Similar-appearing consolidative masslike airspace opacity of the right upper and middle lobe. Bronchiectasis of the right middle lobe again noted. Patchy ground-glass airspace opacities of the right lower lobe again noted. No pulmonary mass. No pleural effusion. No pneumothorax. Musculoskeletal: No chest wall abnormality. No suspicious lytic or blastic osseous lesions. No acute displaced fracture. Multilevel degenerative changes of the spine. Review of the MIP images confirms the above findings. CT ABDOMEN and PELVIS FINDINGS Hepatobiliary: Similar-appearing 6 x 5 cm hypodense right posterior hepatic lobe lesion. Persistent 1.4 cm left medial hepatic lobe mass. Status post cholecystectomy. No biliary dilatation. Pancreas: Diffusely atrophic. No focal lesion. Otherwise normal pancreatic contour. No surrounding inflammatory changes. No main pancreatic ductal dilatation. Spleen: Normal in size without focal abnormality. Adrenals/Urinary Tract: No adrenal nodule bilaterally. Bilateral kidneys enhance symmetrically. Fluid density lesions of the right kidney likely represent simple renal cysts. Simple renal cysts, in the absence of clinically indicated signs/symptoms, require no independent follow-up. No hydronephrosis. No hydroureter. The urinary bladder is decompressed with associated urinary bladder wall thickening. Stomach/Bowel: Stomach is within normal limits. No evidence of bowel wall thickening or dilatation. Appendix appears normal. Vascular/Lymphatic: No abdominal aorta or iliac aneurysm. Severe atherosclerotic plaque of the aorta and  its branches. No abdominal, pelvic, or inguinal lymphadenopathy. Reproductive: Prominent  prostate measures up to 4.6 cm. Other: No intraperitoneal free fluid. No intraperitoneal free gas. No organized fluid collection. Musculoskeletal: No abdominal wall hernia or abnormality. No suspicious lytic or blastic osseous lesions. No acute displaced fracture. Bilateral L5 pars interarticularis defects. Review of the MIP images confirms the above findings. IMPRESSION: 1. Simila nonvisualization of the right upper lobe subsegmental pulmonary arteries that may be due to obstruction or shunting in the setting of right upper quadrant consolidation/mass versus less likely pulmonary embolus. 2. Similar-appearing consolidative masslike airspace opacity of the right upper and middle lobe. Patchy ground-glass airspace opacities of the right lower lobe again noted. Associated right hilar soft tissue density extending to the subcarinal region. Finding concerning for recurrent malignancy versus metastatic disease. 3. Similar-appearing 2 hepatic lobe lesions consistent with known metastasis. 4. Enlarged main pulmonary artery suggestive of pulmonary hypertension. 5. Decompressed urinary bladder with circumferential urinary bladder wall thickening. Finding may be due to under can not compression. Consider correlating with urinalysis for infection. 6. Bilateral L5 pars interarticularis defects. 7. Aneurysmal ascending thoracic aorta (4 cm). Recommend annual imaging followup by CTA or MRA. This recommendation follows 2010 ACCF/AHA/AATS/ACR/ASA/SCA/SCAI/SIR/STS/SVM Guidelines for the Diagnosis and Management of Patients with Thoracic Aortic Disease. Circulation. 2010; 121: Z733-z630. Aortic aneurysm NOS (ICD10-I71.9). 8.  Aortic Atherosclerosis (ICD10-I70.0). Electronically Signed   By: Morgane  Naveau M.D.   On: 08/13/2024 19:18   CT ABDOMEN PELVIS W WO CONTRAST Result Date: 08/13/2024 CLINICAL DATA:  Pulmonary embolism (PE) suspected,  high prob; gross hematuria, lower abd pain. History of metastatic small cell lung cancer. EXAM: CT ANGIOGRAPHY CHEST CT ABDOMEN AND PELVIS WITH CONTRAST TECHNIQUE: Multidetector CT imaging of the chest was performed using the standard protocol during bolus administration of intravenous contrast. Multiplanar CT image reconstructions and MIPs were obtained to evaluate the vascular anatomy. Multidetector CT imaging of the abdomen and pelvis was performed using the standard protocol during bolus administration of intravenous contrast. RADIATION DOSE REDUCTION: This exam was performed according to the departmental dose-optimization program which includes automated exposure control, adjustment of the mA and/or kV according to patient size and/or use of iterative reconstruction technique. CONTRAST:  OMNIPAQUE  IOHEXOL  350 MG/ML SOLN COMPARISON:  CT angio chest 07/31/2024, CT chest abdomen pelvis 03/08/2024, CT abdomen pelvis 08/02/2023 FINDINGS: CTA CHEST FINDINGS Cardiovascular: Satisfactory opacification of the pulmonary arteries to the segmental level. Nonvisualization of the right upper lobe subsegmental pulmonary arteries that may be due to obstruction or shunting in the setting of right upper quadrant consolidation/mass versus less likely pulmonary embolus. The main pulmonary artery is enlarged in caliber measuring up to 3.8 cm. Normal heart size. No significant pericardial effusion. Ascending thoracic aorta is stable in caliber measuring up to 4 cm. Normal caliber descending thoracic aorta. Moderate atherosclerotic plaque of the thoracic aorta. No coronary artery calcifications. Mediastinum/Nodes: Right hilar soft tissue density extending to the subcarinal region. No distinct enlarged mediastinal, left hilar, or axillary lymph nodes. Thyroid  gland, trachea, and esophagus demonstrate no significant findings. Lungs/Pleura: Similar-appearing consolidative masslike airspace opacity of the right upper and middle  lobe. Bronchiectasis of the right middle lobe again noted. Patchy ground-glass airspace opacities of the right lower lobe again noted. No pulmonary mass. No pleural effusion. No pneumothorax. Musculoskeletal: No chest wall abnormality. No suspicious lytic or blastic osseous lesions. No acute displaced fracture. Multilevel degenerative changes of the spine. Review of the MIP images confirms the above findings. CT ABDOMEN and PELVIS FINDINGS Hepatobiliary: Similar-appearing 6 x 5 cm hypodense right posterior hepatic lobe lesion.  Persistent 1.4 cm left medial hepatic lobe mass. Status post cholecystectomy. No biliary dilatation. Pancreas: Diffusely atrophic. No focal lesion. Otherwise normal pancreatic contour. No surrounding inflammatory changes. No main pancreatic ductal dilatation. Spleen: Normal in size without focal abnormality. Adrenals/Urinary Tract: No adrenal nodule bilaterally. Bilateral kidneys enhance symmetrically. Fluid density lesions of the right kidney likely represent simple renal cysts. Simple renal cysts, in the absence of clinically indicated signs/symptoms, require no independent follow-up. No hydronephrosis. No hydroureter. The urinary bladder is decompressed with associated urinary bladder wall thickening. Stomach/Bowel: Stomach is within normal limits. No evidence of bowel wall thickening or dilatation. Appendix appears normal. Vascular/Lymphatic: No abdominal aorta or iliac aneurysm. Severe atherosclerotic plaque of the aorta and its branches. No abdominal, pelvic, or inguinal lymphadenopathy. Reproductive: Prominent prostate measures up to 4.6 cm. Other: No intraperitoneal free fluid. No intraperitoneal free gas. No organized fluid collection. Musculoskeletal: No abdominal wall hernia or abnormality. No suspicious lytic or blastic osseous lesions. No acute displaced fracture. Bilateral L5 pars interarticularis defects. Review of the MIP images confirms the above findings. IMPRESSION: 1.  Simila nonvisualization of the right upper lobe subsegmental pulmonary arteries that may be due to obstruction or shunting in the setting of right upper quadrant consolidation/mass versus less likely pulmonary embolus. 2. Similar-appearing consolidative masslike airspace opacity of the right upper and middle lobe. Patchy ground-glass airspace opacities of the right lower lobe again noted. Associated right hilar soft tissue density extending to the subcarinal region. Finding concerning for recurrent malignancy versus metastatic disease. 3. Similar-appearing 2 hepatic lobe lesions consistent with known metastasis. 4. Enlarged main pulmonary artery suggestive of pulmonary hypertension. 5. Decompressed urinary bladder with circumferential urinary bladder wall thickening. Finding may be due to under can not compression. Consider correlating with urinalysis for infection. 6. Bilateral L5 pars interarticularis defects. 7. Aneurysmal ascending thoracic aorta (4 cm). Recommend annual imaging followup by CTA or MRA. This recommendation follows 2010 ACCF/AHA/AATS/ACR/ASA/SCA/SCAI/SIR/STS/SVM Guidelines for the Diagnosis and Management of Patients with Thoracic Aortic Disease. Circulation. 2010; 121: [id number]. Aortic aneurysm NOS (ICD10-I71.9). 8.  Aortic Atherosclerosis (ICD10-I70.0). Electronically Signed   By: Morgane  Naveau M.D.   On: 08/13/2024 19:18   MR Lumbar Spine W Wo Contrast Result Date: 08/13/2024 EXAM: MR Lumbar Spine With and Without Intravenous Contrast. 08/13/2024 06:19:21 PM TECHNIQUE: Multiplanar multisequence MRI of the lumbar spine was performed with and without the administration of intravenous contrast. COMPARISON: CT of the abdomen and pelvis without and with contrast 08/13/2024. CT of the lumbar spine without contrast 04/26/2024. MRI of the lumbar spine without contrast 02/21/2019. CLINICAL HISTORY: Low back pain, cauda equina syndrome suspected. Lung cancer. FINDINGS: BONES AND ALIGNMENT: Slight  degenerative retrolisthesis at T12-L1 and L4-5 is stable. 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 SPINAL CORD: Conus medullaris terminates at L1. SOFT TISSUES: Simple cysts at the lower pole of the right kidney are incompletely imaged. These were seen on the CT scan of the same day. L1-L2: Dysplasia and facet hypertrophy is present without significant stenosis. L2-L3: A broad-based disc protrusion is present. Moderate facet hypertrophy is noted bilaterally. Mild central and bilateral foraminal stenosis has progressed. L3-L4: A broad-based disc protrusion is present. Moderate facet hypertrophy has advanced. Severe central and right foraminal stenosis has progressed. Moderate left foraminal stenosis has progressed. L4-L5: Broad-based disc protrusion is present. Moderate facet hypertrophy is noted bilaterally. Chronic loss of disc height present. L5-S1: No disc herniation. No spinal canal stenosis or neural foraminal narrowing. IMPRESSION: 1. Progressed severe central and  right foraminal stenosis and moderate left foraminal stenosis at L3-L4. 2. Progressed mild central and bilateral foraminal stenosis at L2-L3. 3. Mild central and moderate bilateral foraminal stenosis at L4-5 is stable. Electronically signed by: Lonni Necessary MD 08/13/2024 06:45 PM EDT RP Workstation: [id number]   DG Chest 2 View Result Date: 08/13/2024 CLINICAL DATA:  Chest pain and shortness of breath. EXAM: DG CHEST 2V COMPARISON:  Chest x-ray 07/31/2024.  Chest CT 07/31/2024. FINDINGS: Right chest port catheter tip ends in the distal SVC, unchanged. Volume loss throughout the right hemithorax with pleural thickening persists. Right upper lobe and perihilar mass is unchanged. There is a new band of atelectasis in the left lung base. The left lung is otherwise clear. No pneumothorax. No pleural effusion. No acute fractures are seen. IMPRESSION: 1. New band of atelectasis in the left lung base. 2. Stable right upper  lobe and perihilar mass. 3. Stable volume loss throughout the right hemithorax with pleural thickening. Electronically Signed   By: Greig Pique M.D.   On: 08/13/2024 17:13     Procedures   Medications Ordered in the ED  sodium chloride  0.9 % bolus 500 mL (0 mLs Intravenous Stopped 08/13/24 1822)  morphine  (PF) 4 MG/ML injection 4 mg (4 mg Intravenous Given 08/13/24 1727)  iohexol  (OMNIPAQUE ) 350 MG/ML injection 100 mL (100 mLs Intravenous Contrast Given 08/13/24 1836)  gadobutrol  (GADAVIST ) 1 MMOL/ML injection 10 mL (10 mLs Intravenous Contrast Given 08/13/24 1819)  HYDROmorphone  (DILAUDID ) injection 1 mg (1 mg Intravenous Given 08/13/24 1918)                                    Medical Decision Making Amount and/or Complexity of Data Reviewed Labs: ordered. Radiology: ordered.  Risk Prescription drug management.   This patient presents to the ED for concern of weakness, worsening back pain, chest pain, shortness of breath, this involves an extensive number of treatment options, and is a complaint that carries with it a high risk of complications and morbidity.  I considered the following differential and admission for this acute, potentially life threatening condition.  The differential diagnosis includes PE, cauda equina syndrome, infection, UTI, anemia, electrolyte abnormality, ACS  MDM:    Patient is a 73 year old with a history of PE on Eliquis  as well as metastatic lung cancer who presents with hematuria.  His urine is not consistent with infection.  He did not have any RBCs in his urine he does not have any ongoing hematuria.  He did have some abdominal pain.  He also had some chest pain and shortness of breath.  With his history of PE, CTA was performed.  There is no definitive evidence of PE.  He has similar findings of hilar mass with obstructive disease.  He does not have any hypoxia here in the ED.  His troponins are minimally elevated but flat.  No ischemic changes noted on  EKG.  CT of his abdomen pelvis do not show any acute abnormality.  There was a noted thoracic aneurysm which was seen on the prior CT.  I relayed this information to his wife.  Will need periodic follow-up.  Given his increased back pain with reports of weakness in the leg, MRI was performed of lumbar spine.  He had a recent spinal injection and is on Eliquis  so I was making sure there was not any evidence of an epidural hematoma or spinal abscess.  There was  significant spinal stenosis but no cord compression or signs of cauda equina.  No spinal epidural hematoma or abscess.  Labs show some ongoing findings of anemia and some mild lecture light abnormalities but similar to prior values.  I gave him the option of being admitted for symptomatic treatment but he is adamant about wanting to go home.  It seems that the family is leaning toward possibly palliative care.  Patient will be discharged home with his wife.  Was encouraged to have follow-up with his oncologist.  Return precautions were given.  (Labs, imaging, consults)  Labs: I Ordered, and personally interpreted labs.  The pertinent results include: Anemia, hyponatremia, low albumin, mildly elevated troponins  Imaging Studies ordered: I ordered imaging studies including CT chest, CT abdomen pelvis, MRI lumbar spine I independently visualized and interpreted imaging. I agree with the radiologist interpretation  Additional history obtained from wife.  External records from outside source obtained and reviewed including history  Cardiac Monitoring: The patient was maintained on a cardiac monitor.  If on the cardiac monitor, I personally viewed and interpreted the cardiac monitored which showed an underlying rhythm of: Sinus rhythm  Reevaluation: After the interventions noted above, I reevaluated the patient and found that they have :improved  Social Determinants of Health:    Disposition: Discharged to home  Co morbidities that complicate  the patient evaluation  Past Medical History:  Diagnosis Date   Acute on chronic respiratory failure with hypoxia (HCC) 10/12/2021   Chickenpox    Chronic knee pain    Chronic low back pain    COPD exacerbation (HCC) 10/11/2021   COPD with exacerbation (HCC) 10/12/2021   Coronary artery calcification seen on CAT scan 11/2021   Coronary CTA 11/27/2021: Moderate to large right pleural effusion and compressive atelectasis right lung base. => Coronary Calcium Score 657.  Diffuse RCA calcification.  Minimal mild disease in the LAD and diagonal branches. == Overall limited study.  Notable artifact.   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 He has disease progression after the first line of chemotherapy in December 2021.   Testosterone  deficiency    Type 2 diabetes mellitus (HCC)      Medicines Meds ordered this encounter  Medications   sodium chloride  0.9 % bolus 500 mL   morphine  (PF) 4 MG/ML injection 4 mg   iohexol  (OMNIPAQUE ) 350 MG/ML injection 100 mL   gadobutrol  (GADAVIST ) 1 MMOL/ML injection 10 mL   HYDROmorphone  (DILAUDID ) injection 1 mg    I have reviewed the patients home medicines and have made adjustments as needed  Problem List / ED Course: Problem List Items Addressed This Visit       Other   Anemia   Other Visit Diagnoses       Chronic midline low back pain with bilateral sciatica    -  Primary   Relevant Medications   morphine  (PF) 4 MG/ML injection 4 mg (Completed)   HYDROmorphone   (DILAUDID ) injection 1 mg (Completed)     Lung mass  Aneurysm of ascending aorta without rupture [facility])                    Final diagnoses:  Chronic midline low back pain with bilateral sciatica  Lung mass  Aneurysm of ascending aorta without rupture (HCC)  Anemia, unspecified type    ED Discharge Orders     None          Lenor Hollering, MD 08/13/24 2246

## 2024-08-14 NOTE — Progress Notes (Unsigned)
 [facility] OFFICE PROGRESS NOTE  [name], NP [address] [name] [address]  DIAGNOSIS: Relapsed extensive stage (T4, N3, M1c)  small cell lung cancer diagnosed in April 2021 and presented with large right upper lobe/right hilar mass with ipsilateral and contralateral mediastinal and right supraclavicular lymphadenopathy in addition to multiple liver lesions. He has disease progression after the first line of chemotherapy in December 2021.   PRIOR THERAPY: 1) Palliative radiotherapy to the right upper lobe lung mass under the care of Dr. Dewey. 2) Systemic chemotherapy with carboplatin  for AUC of 5 on day 1, etoposide  100 mg/M2 on days 1, 2 and 3 in addition to Imfinzi  1500 mg IV every 3 weeks with chemotherapy then every 4 weeks for maintenance if the patient has no evidence for progression.  He will also receive Cosela  240 mg/m2 on the days of the chemotherapy.  Status post 9 cycles.  Starting from cycle #5 the patient will be on maintenance treatment with immunotherapy with Imfinzi  1500 mg IV every 4 weeks. Last dose of chemotherapy was given on November 13, 2020. This treatment was discontinued secondary to disease progression 3) Systemic chemotherapy with carboplatin  for AUC of 5 on day 1, etoposide  100 mg/M2 on days 1, 2 and 3, Tecentriq  1200 mg IV every 3 weeks as well as Cosela  250 mg/M2 on the days of the chemotherapy every 3 weeks.  First dose December 18, 2020.  Status post 8 cycles. 4) Zepzelca  (lurbinectedin ) 3.2 mgm2 IV every 3 weeks. Last dose on 01/23/22. Status post 12 cycles.  5) Palliative systemic chemotherapy with irinotecan  65 mg/m2 on days 1 and 8 IV every 3 weeks.  Status post 3 cycles.  Last dose was given April 01, 2022 discontinued secondary to disease progression. 6) SBRT to the progressive liver lesions under the care of Dr. Dewey.  Last fraction January 27, 2023. 7) systemic chemotherapy with carboplatin  for AUC of 5 on day 1, etoposide  100  Mg/M2 on days 1, 2 and 3 with Cosela  before the chemotherapy.  First dose expected to start on 05/28/2022.  Status post 15 cycles.  Starting from cycle #5 his carboplatin  will be reduced to AUC of 4 and 2 etoposide  80 Mg/M2.  Last dose was giving April 07, 2023 discontinued for suspicious disease progression. 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 Dewey. Last dose on 05/16/24.  11) Topotecan  1.2 mg/M2 on days 1-5 every 3 weeks with Cosela  240 Mg/M2 IV before the chemotherapy.  First dose January 11, 2024. Status post 8 cycles.  CURRENT THERAPY: Palliative systemic chemotherapy with carboplatin  on day 1 and etoposide  on days 1, 2, and 3 IV every 3 weeks.  First dose expected around 08/10/2024.  Dr. Sherrod will likely reduce the dose from cycle #1. He is status post 1 cycle.   INTERVAL HISTORY: Adam Wise 73 y.o. male returns to the clinic today for a follow-up visit accompanied by his wife.  The patient has been treated for small cell lung cancer for several years. He has underwent multiple lines of therapy. At his last appointment on 08/04/24, repeat imaging studies showed disease progression. Therefore he re-started chemotherapy with carboplatin  and etoposide  at reduced dose which he tolerated fair  He has chronic back pain and has recently established with a back specialist at [facility]. He had a back injection on 08/10/24 which is unclear if  it helped his pain. They were told it may take 2 weeks to have improvement.   He has visited the ER three times for this pain. He is currently on morphine  three times a day, which provides some relief but does not completely alleviate the pain.  They did an MRI of the back which continue to show progress severe central and right foraminal stenosis and moderate left foraminal stenosis at L3 and L4.  There is also progressed mild central and bilateral foraminal stenosis at  L2 and L3.  There is mild central and moderate bilateral foraminal stenosis at L4 and L5 which is stable.  Also the emergency room he had weakness and 1 episode of hematuria where he had a blood clot in the toilet after he urinated.  No significant thrombocytopenia was noted on his labs.  He is on a blood thinner.  He does not have a urologist.  There was no evidence of UTI.  There was a moderate amount of blood on a urine dipstick.  No cord compression was seen or cauda equina.  The patient denies any saddle anesthesia.  He has control of his bowel habits.  He does has some urinary incontinence especially at nighttime.   He uses a urinal at night and has recently tried an external catheter.  He has lost about ten pounds recently. He consumes Premier protein drinks to supplement his nutrition, but there is concern about sugar content due to his diabetes. His PCP is considering adjusting one of his medications for his heart and diabetes per patient wife. No vomiting, diarrhea, or dehydration, but he reports significant weakness.  No changes in shortness of breath, unusual cough, chest pain, or other bleeding episodes.   He is scheduled to see palliative care on 08/30/2024.  They had questions about starting back PT and a home health referral.  They had questions about hospice but they do not want to stop treatment and therefore not interested in hospice.  He is here today for evaluation and repeat blood work.   MEDICAL HISTORY: Past Medical History:  Diagnosis Date   Acute on chronic respiratory failure with hypoxia (HCC) 10/12/2021   Chickenpox    Chronic knee pain    Chronic low back pain    COPD exacerbation (HCC) 10/11/2021   COPD with exacerbation (HCC) 10/12/2021   Coronary artery calcification seen on CAT scan 11/2021   Coronary CTA 11/27/2021: Moderate to large right pleural effusion and compressive atelectasis right lung base. => Coronary Calcium Score 657.  Diffuse RCA calcification.   Minimal mild disease in the LAD and diagonal branches. == Overall limited study.  Notable artifact.   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 He has disease progression after the first line of chemotherapy in December 2021.   Testosterone  deficiency    Type 2 diabetes mellitus (HCC)     ALLERGIES:  is allergic to bupropion and hydrochlorothiazide.  MEDICATIONS:  Current Outpatient Medications  Medication Sig Dispense Refill   albuterol  (VENTOLIN  HFA) 108 (90 Base) MCG/ACT inhaler  Inhale 2 puffs into the lungs every 4 (four) hours as needed for shortness of breath. 18 each 0   B-D 3CC LUER-LOK SYR 22GX1 22G X 1 3 ML MISC USE AS INSTRUCTED FOR TESTOSTERONE  INJECTION EVERY 2 WEEKS 10 each 2   Black Pepper-Turmeric (TURMERIC COMPLEX/BLACK PEPPER PO) Take 1 tablet by mouth in the morning and at bedtime.     Calcium Carb-Cholecalciferol (CALCIUM PLUS VITAMIN D3 PO) Take 1 tablet by mouth in the morning and at bedtime.     Coenzyme Q10 (COQ10) 200 MG CAPS Take 200 mg by mouth daily.     cyclobenzaprine  (FLEXERIL ) 10 MG tablet Take 1 tablet (10 mg total) by mouth 2 (two) times daily as needed for muscle spasms. 20 tablet 0   docusate sodium  (COLACE) 100 MG capsule Take 1 capsule (100 mg total) by mouth every 12 (twelve) hours. 60 capsule 0   ELIQUIS  5 MG TABS tablet TAKE 1 TABLET BY MOUTH TWICE A DAY 180 tablet 2   esomeprazole (NEXIUM) 20 MG capsule Take 20 mg by mouth in the morning and at bedtime.     gabapentin   (NEURONTIN ) 300 MG capsule Take 1 capsule (300 mg total) by mouth 2 (two) times daily. For back pain. 180 capsule 1   glucose blood (ONETOUCH ULTRA) test strip USE UP TO 4 TIMES DAILY AS DIRECTED 400 strip 5   Insulin  Pen Needle (BD PEN NEEDLE NANO U/F) 32G X 4 MM MISC Use with insulin  as prescribed Dx Code: E11.9 100 each 3   ipratropium-albuterol  (DUONEB) 0.5-2.5 (3) MG/3ML SOLN Take 3 mLs by nebulization every 4 (four) hours while awake for 3 days, THEN 3 mLs every 4 (four) hours as needed (shortness of breath or wheezing). 360 mL 0   KRILL OIL PO Take 1 capsule by mouth daily.     Lancets MISC USE UP TO 3 TIMES DAILY AS DIRECTED 100 each 2   lidocaine  (LIDODERM ) 5 % Place 1 patch onto the skin daily. Remove & Discard patch within 12 hours or as directed by MD 30 patch 0   losartan  (COZAAR ) 50 MG tablet TAKE 1 TABLET (50 MG TOTAL) BY MOUTH DAILY. FOR BLOOD PRESSURE. 90 tablet 3   metFORMIN  (GLUCOPHAGE ) 1000 MG tablet TAKE 1 TABLET (1,000 MG TOTAL) BY MOUTH 2 (TWO) TIMES DAILY WITH A MEAL. FOR DIABETES 180 tablet 0   mirtazapine  (REMERON ) 15 MG tablet TAKE 1 TABLET BY MOUTH EVERYDAY AT BEDTIME 90 tablet 1   morphine  (MS CONTIN ) 30 MG 12 hr tablet Take 1 tablet (30 mg total) by mouth every 8 (eight) hours as needed. 90 tablet 0   Multiple Vitamin (MULTI-VITAMINS) TABS Take 1 tablet by mouth daily with breakfast.     Naloxone  HCl 3 MG/0.1ML LIQD Place 1 spray into both nostrils once. For known/suspected opiod overdose. Every 2-3 minutes in alternating nostril till EMS arrives.     ondansetron  (ZOFRAN ) 8 MG tablet Take 1 tablet (8 mg total) by mouth every 8 (eight) hours as needed for nausea or vomiting. 30 tablet 3   oxyCODONE -acetaminophen  (PERCOCET) 10-325 MG tablet Take 1-1.5 tablets by mouth every 4 (four) hours as needed for pain. 90 tablet 0   pioglitazone  (ACTOS ) 45 MG tablet TAKE 1 TABLET (45 MG TOTAL) BY MOUTH DAILY. FOR DIABETES. 90 tablet 1   polyethylene glycol powder (GLYCOLAX /MIRALAX )  17 GM/SCOOP powder Take 17 g by mouth daily as needed for moderate constipation or mild constipation.     pravastatin  (PRAVACHOL ) 40 MG  tablet TAKE 1 TABLET BY MOUTH EVERY DAY IN THE EVENING FOR CHOLESTEROL 90 tablet 3   PRESCRIPTION MEDICATION CPAP- At bedtime     testosterone  cypionate (DEPOTESTOSTERONE CYPIONATE) 200 MG/ML injection Inject 200 mg into the muscle every 14 (fourteen) days.     No current facility-administered medications for this visit.   Facility-Administered Medications Ordered in Other Visits  Medication Dose Route Frequency Provider Last Rate Last Admin   heparin  lock flush 100 unit/mL  500 Units Intracatheter Once Sherrod Sherrod, MD       sodium chloride  flush (NS) 0.9 % injection 10 mL  10 mL Intracatheter Once Sherrod Sherrod, MD        SURGICAL HISTORY:  Past Surgical History:  Procedure Laterality Date   CHEST TUBE INSERTION Right 01/20/2022   Procedure: CHEST TUBE INSERTION;  Surgeon: Claudene Toribio BROCKS, MD;  Location: [facility] ENDOSCOPY;  Service: Pulmonary;  Laterality: Right;  w/ Talc  Pleurodesis, planned admit for Obs afterwards   CHEST TUBE INSERTION Right 04/28/2022   Procedure: INSERTION PLEURAL DRAINAGE CATHETER - Pigtail tail, drainage;  Surgeon: Claudene Toribio BROCKS, MD;  Location[facility] END[facility]COPY;  Service: Pulmonary;  Laterality: Right;  talc  pleurodesis   CHEST TUBE INSERTION Right 09/01/2022   Procedure: INSERTION PLEURAL DRAINAGE CATHETER;  Surgeon: Alaine Vicenta NOVAK, MD;  Location: [facility] ENDOSCOPY;  Service: Cardiopulmonary;  Laterality: Right;  pigtail catheter placement with talc  pleurodesis   COLONOSCOPY WITH PROPOFOL  N/A 12/17/2018   Procedure: COLONOSCOPY WITH PROPOFOL ;  Surgeon: Janalyn Keene NOVAK, MD;  Location: [facility] ENDOSCOPY;  Service: Gastroenterology;  Laterality: N/A;   IR IMAGING GUIDED PORT INSERTION  04/17/2020   IR THORACENTESIS ASP PLEURAL SPACE W/IMG GUIDE  11/29/2021   IR THORACENTESIS ASP PLEURAL SPACE W/IMG GUIDE  01/03/2022   JOINT REPLACEMENT  Bilateral    REPLACEMENT TOTAL KNEE BILATERAL  2015   TALC  PLEURODESIS  09/01/2022   Procedure: TALC  PLEURADESIS;  Surgeon: Alaine Vicenta NOVAK, MD;  Location: [facility] ENDOSCOPY;  Service: Cardiopulmonary;;   TONSILLECTOMY  1960   TRANSTHORACIC ECHOCARDIOGRAM  05/2020   a) 05/2020: EF 55 to 60%.  No R WMA.  Mild LVH.  Indeterminate LVEDP.  Unable to assess RVP.  Normal aortic and mitral valves.  Mildly elevated RAP.; b) 09/2021: EF 50-55%. No RWMA. Mild LVH. ~ LVEDP. Mild LA Dilation. NORMAL RV/RAP.  Normal MV/AoV.    REVIEW OF SYSTEMS:   Review of Systems  Constitutional: Positive for fatigue, weight loss, and appetite change.  Negative for chills and fever. HENT: Negative for mouth sores, nosebleeds, sore throat and trouble swallowing.   Eyes: Negative for eye problems and icterus.  Respiratory: Positive for shortness of breath. Negative for cough, hemoptysis, shortness of breath and wheezing.   Cardiovascular: Negative for chest pain and leg swelling.  Gastrointestinal: Negative for abdominal pain, constipation, diarrhea, nausea and vomiting.  Genitourinary: Positive for 1 episode of hematuria and some bladder incontinence at nighttime.  Negative for difficulty urinating, dysuria, and frequency.   Musculoskeletal: Positive for back pain. Negative for gait problem, neck pain and neck stiffness.  Skin: Negative for itching and rash.  Neurological: Positive for lower extremity weakness. Negative for dizziness, gait problem, headaches, light-headedness and seizures.  Hematological: Negative for adenopathy. Does not bruise/bleed easily.  Psychiatric/Behavioral: Negative for confusion, depression and sleep disturbance. The patient is not nervous/anxious.     PHYSICAL EXAMINATION:  There were no vitals taken for this visit.  ECOG PERFORMANCE STATUS: 1  Physical Exam  Constitutional: Oriented to person, place, and time  and well-developed, well-nourished, and in no distress.  HENT:  Head:  Normocephalic and atraumatic.  Mouth/Throat: Oropharynx is clear and moist. No oropharyngeal exudate.  Eyes: Conjunctivae are normal. Right eye exhibits no discharge. Left eye exhibits no discharge. No scleral icterus.  Neck: Normal range of motion. Neck supple.  Cardiovascular: Tachycardic, regular rhythm, normal heart sounds and intact distal pulses.   Pulmonary/Chest: Effort normal. Quiet breath sounds in the lung bilaterally. No respiratory distress. No rales.  Abdominal: Soft. Bowel sounds are normal. Exhibits no distension and no mass. There is no tenderness.  Musculoskeletal: Normal range of motion. Exhibits no edema.  Lymphadenopathy:    No cervical adenopathy.  Neurological: Alert and oriented to person, place, and time. Exhibits muscle wasting. Examined in the wheelchair.  Skin: Skin is warm and dry. No rash noted. Not diaphoretic. No erythema. No pallor.  Psychiatric: Mood, memory and judgment normal.  Vitals reviewed.  LABORATORY DATA: Lab Results  Component Value Date   WBC 8.3 08/13/2024   HGB 9.5 (L) 08/13/2024   HCT 28.0 (L) 08/13/2024   MCV 98.7 08/13/2024   PLT 162 08/13/2024      Chemistry      Component Value Date/Time   NA 132 (L) 08/13/2024 1717   K 4.2 08/13/2024 1717   CL 97 (L) 08/13/2024 1717   CO2 22 08/13/2024 1615   BUN 26 (H) 08/13/2024 1717   CREATININE 0.90 08/13/2024 1717   CREATININE 0.85 08/09/2024 1224   CREATININE 1.45 (H) 10/18/2021 1533      Component Value Date/Time   CALCIUM 8.9 08/13/2024 1615   ALKPHOS 138 (H) 08/13/2024 1615   AST 39 08/13/2024 1615   AST 30 08/09/2024 1224   ALT 49 (H) 08/13/2024 1615   ALT 29 08/09/2024 1224   BILITOT 0.6 08/13/2024 1615   BILITOT 0.5 08/09/2024 1224       RADIOGRAPHIC STUDIES:  CT Angio Chest PE W/Cm &/Or Wo Cm Result Date: 08/13/2024 CLINICAL DATA:  Pulmonary embolism (PE) suspected, high prob; gross hematuria, lower abd pain. History of metastatic small cell lung cancer. EXAM: CT  ANGIOGRAPHY CHEST CT ABDOMEN AND PELVIS WITH CONTRAST TECHNIQUE: Multidetector CT imaging of the chest was performed using the standard protocol during bolus administration of intravenous contrast. Multiplanar CT image reconstructions and MIPs were obtained to evaluate the vascular anatomy. Multidetector CT imaging of the abdomen and pelvis was performed using the standard protocol during bolus administration of intravenous contrast. RADIATION DOSE REDUCTION: This exam was performed according to the departmental dose-optimization program which includes automated exposure control, adjustment of the mA and/or kV according to patient size and/or use of iterative reconstruction technique. CONTRAST:  OMNIPAQUE  IOHEXOL  350 MG/ML SOLN COMPARISON:  CT angio chest 07/31/2024, CT chest abdomen pelvis 03/08/2024, CT abdomen pelvis 08/02/2023 FINDINGS: CTA CHEST FINDINGS Cardiovascular: Satisfactory opacification of the pulmonary arteries to the segmental level. Nonvisualization of the right upper lobe subsegmental pulmonary arteries that may be due to obstruction or shunting in the setting of right upper quadrant consolidation/mass versus less likely pulmonary embolus. The main pulmonary artery is enlarged in caliber measuring up to 3.8 cm. Normal heart size. No significant pericardial effusion. Ascending thoracic aorta is stable in caliber measuring up to 4 cm. Normal caliber descending thoracic aorta. Moderate atherosclerotic plaque of the thoracic aorta. No coronary artery calcifications. Mediastinum/Nodes: Right hilar soft tissue density extending to the subcarinal region. No distinct enlarged mediastinal, left hilar, or axillary lymph nodes. Thyroid  gland, trachea, and esophagus demonstrate no significant  findings. Lungs/Pleura: Similar-appearing consolidative masslike airspace opacity of the right upper and middle lobe. Bronchiectasis of the right middle lobe again noted. Patchy ground-glass airspace opacities of  the right lower lobe again noted. No pulmonary mass. No pleural effusion. No pneumothorax. Musculoskeletal: No chest wall abnormality. No suspicious lytic or blastic osseous lesions. No acute displaced fracture. Multilevel degenerative changes of the spine. Review of the MIP images confirms the above findings. CT ABDOMEN and PELVIS FINDINGS Hepatobiliary: Similar-appearing 6 x 5 cm hypodense right posterior hepatic lobe lesion. Persistent 1.4 cm left medial hepatic lobe mass. Status post cholecystectomy. No biliary dilatation. Pancreas: Diffusely atrophic. No focal lesion. Otherwise normal pancreatic contour. No surrounding inflammatory changes. No main pancreatic ductal dilatation. Spleen: Normal in size without focal abnormality. Adrenals/Urinary Tract: No adrenal nodule bilaterally. Bilateral kidneys enhance symmetrically. Fluid density lesions of the right kidney likely represent simple renal cysts. Simple renal cysts, in the absence of clinically indicated signs/symptoms, require no independent follow-up. No hydronephrosis. No hydroureter. The urinary bladder is decompressed with associated urinary bladder wall thickening. Stomach/Bowel: Stomach is within normal limits. No evidence of bowel wall thickening or dilatation. Appendix appears normal. Vascular/Lymphatic: No abdominal aorta or iliac aneurysm. Severe atherosclerotic plaque of the aorta and its branches. No abdominal, pelvic, or inguinal lymphadenopathy. Reproductive: Prominent prostate measures up to 4.6 cm. Other: No intraperitoneal free fluid. No intraperitoneal free gas. No organized fluid collection. Musculoskeletal: No abdominal wall hernia or abnormality. No suspicious lytic or blastic osseous lesions. No acute displaced fracture. Bilateral L5 pars interarticularis defects. Review of the MIP images confirms the above findings. IMPRESSION: 1. Simila nonvisualization of the right upper lobe subsegmental pulmonary arteries that may be due to  obstruction or shunting in the setting of right upper quadrant consolidation/mass versus less likely pulmonary embolus. 2. Similar-appearing consolidative masslike airspace opacity of the right upper and middle lobe. Patchy ground-glass airspace opacities of the right lower lobe again noted. Associated right hilar soft tissue density extending to the subcarinal region. Finding concerning for recurrent malignancy versus metastatic disease. 3. Similar-appearing 2 hepatic lobe lesions consistent with known metastasis. 4. Enlarged main pulmonary artery suggestive of pulmonary hypertension. 5. Decompressed urinary bladder with circumferential urinary bladder wall thickening. Finding may be due to under can not compression. Consider correlating with urinalysis for infection. 6. Bilateral L5 pars interarticularis defects. 7. Aneurysmal ascending thoracic aorta (4 cm). Recommend annual imaging followup by CTA or MRA. This recommendation follows 2010 ACCF/AHA/AATS/ACR/ASA/SCA/SCAI/SIR/STS/SVM Guidelines for the Diagnosis and Management of Patients with Thoracic Aortic Disease. Circulation. 2010; 121: [id number]. Aortic aneurysm NOS (ICD10-I71.9). 8.  Aortic Atherosclerosis (ICD10-I70.0). Electronically Signed   By: Morgane  Naveau M.D.   On: 08/13/2024 19:18   CT ABDOMEN PELVIS W WO CONTRAST Result Date: 08/13/2024 CLINICAL DATA:  Pulmonary embolism (PE) suspected, high prob; gross hematuria, lower abd pain. History of metastatic small cell lung cancer. EXAM: CT ANGIOGRAPHY CHEST CT ABDOMEN AND PELVIS WITH CONTRAST TECHNIQUE: Multidetector CT imaging of the chest was performed using the standard protocol during bolus administration of intravenous contrast. Multiplanar CT image reconstructions and MIPs were obtained to evaluate the vascular anatomy. Multidetector CT imaging of the abdomen and pelvis was performed using the standard protocol during bolus administration of intravenous contrast. RADIATION DOSE REDUCTION: This  exam was performed according to the departmental dose-optimization program which includes automated exposure control, adjustment of the mA and/or kV according to patient size and/or use of iterative reconstruction technique. CONTRAST:  OMNIPAQUE  IOHEXOL  350 MG/ML SOLN COMPARISON:  CT angio  chest 07/31/2024, CT chest abdomen pelvis 03/08/2024, CT abdomen pelvis 08/02/2023 FINDINGS: CTA CHEST FINDINGS Cardiovascular: Satisfactory opacification of the pulmonary arteries to the segmental level. Nonvisualization of the right upper lobe subsegmental pulmonary arteries that may be due to obstruction or shunting in the setting of right upper quadrant consolidation/mass versus less likely pulmonary embolus. The main pulmonary artery is enlarged in caliber measuring up to 3.8 cm. Normal heart size. No significant pericardial effusion. Ascending thoracic aorta is stable in caliber measuring up to 4 cm. Normal caliber descending thoracic aorta. Moderate atherosclerotic plaque of the thoracic aorta. No coronary artery calcifications. Mediastinum/Nodes: Right hilar soft tissue density extending to the subcarinal region. No distinct enlarged mediastinal, left hilar, or axillary lymph nodes. Thyroid  gland, trachea, and esophagus demonstrate no significant findings. Lungs/Pleura: Similar-appearing consolidative masslike airspace opacity of the right upper and middle lobe. Bronchiectasis of the right middle lobe again noted. Patchy ground-glass airspace opacities of the right lower lobe again noted. No pulmonary mass. No pleural effusion. No pneumothorax. Musculoskeletal: No chest wall abnormality. No suspicious lytic or blastic osseous lesions. No acute displaced fracture. Multilevel degenerative changes of the spine. Review of the MIP images confirms the above findings. CT ABDOMEN and PELVIS FINDINGS Hepatobiliary: Similar-appearing 6 x 5 cm hypodense right posterior hepatic lobe lesion. Persistent 1.4 cm left medial hepatic  lobe mass. Status post cholecystectomy. No biliary dilatation. Pancreas: Diffusely atrophic. No focal lesion. Otherwise normal pancreatic contour. No surrounding inflammatory changes. No main pancreatic ductal dilatation. Spleen: Normal in size without focal abnormality. Adrenals/Urinary Tract: No adrenal nodule bilaterally. Bilateral kidneys enhance symmetrically. Fluid density lesions of the right kidney likely represent simple renal cysts. Simple renal cysts, in the absence of clinically indicated signs/symptoms, require no independent follow-up. No hydronephrosis. No hydroureter. The urinary bladder is decompressed with associated urinary bladder wall thickening. Stomach/Bowel: Stomach is within normal limits. No evidence of bowel wall thickening or dilatation. Appendix appears normal. Vascular/Lymphatic: No abdominal aorta or iliac aneurysm. Severe atherosclerotic plaque of the aorta and its branches. No abdominal, pelvic, or inguinal lymphadenopathy. Reproductive: Prominent prostate measures up to 4.6 cm. Other: No intraperitoneal free fluid. No intraperitoneal free gas. No organized fluid collection. Musculoskeletal: No abdominal wall hernia or abnormality. No suspicious lytic or blastic osseous lesions. No acute displaced fracture. Bilateral L5 pars interarticularis defects. Review of the MIP images confirms the above findings. IMPRESSION: 1. Simila nonvisualization of the right upper lobe subsegmental pulmonary arteries that may be due to obstruction or shunting in the setting of right upper quadrant consolidation/mass versus less likely pulmonary embolus. 2. Similar-appearing consolidative masslike airspace opacity of the right upper and middle lobe. Patchy ground-glass airspace opacities of the right lower lobe again noted. Associated right hilar soft tissue density extending to the subcarinal region. Finding concerning for recurrent malignancy versus metastatic disease. 3. Similar-appearing 2 hepatic  lobe lesions consistent with known metastasis. 4. Enlarged main pulmonary artery suggestive of pulmonary hypertension. 5. Decompressed urinary bladder with circumferential urinary bladder wall thickening. Finding may be due to under can not compression. Consider correlating with urinalysis for infection. 6. Bilateral L5 pars interarticularis defects. 7. Aneurysmal ascending thoracic aorta (4 cm). Recommend annual imaging followup by CTA or MRA. This recommendation follows 2010 ACCF/AHA/AATS/ACR/ASA/SCA/SCAI/SIR/STS/SVM Guidelines for the Diagnosis and Management of Patients with Thoracic Aortic Disease. Circulation. 2010; 121: [id number]. Aortic aneurysm NOS (ICD10-I71.9). 8.  Aortic Atherosclerosis (ICD10-I70.0). Electronically Signed   By: Morgane  Naveau M.D.   On: 08/13/2024 19:18   MR Lumbar Spine W Wo Contrast  Result Date: 08/13/2024 EXAM: MR Lumbar Spine With and Without Intravenous Contrast. 08/13/2024 06:19:21 PM TECHNIQUE: Multiplanar multisequence MRI of the lumbar spine was performed with and without the administration of intravenous contrast. COMPARISON: CT of the abdomen and pelvis without and with contrast 08/13/2024. CT of the lumbar spine without contrast 04/26/2024. MRI of the lumbar spine without contrast 02/21/2019. CLINICAL HISTORY: Low back pain, cauda equina syndrome suspected. Lung cancer. FINDINGS: BONES AND ALIGNMENT: Slight degenerative retrolisthesis at T12-L1 and L4-5 is stable. 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 SPINAL CORD: Conus medullaris terminates at L1. SOFT TISSUES: Simple cysts at the lower pole of the right kidney are incompletely imaged. These were seen on the CT scan of the same day. L1-L2: Dysplasia and facet hypertrophy is present without significant stenosis. L2-L3: A broad-based disc protrusion is present. Moderate facet hypertrophy is noted bilaterally. Mild central and bilateral foraminal stenosis has progressed. L3-L4: A broad-based disc  protrusion is present. Moderate facet hypertrophy has advanced. Severe central and right foraminal stenosis has progressed. Moderate left foraminal stenosis has progressed. L4-L5: Broad-based disc protrusion is present. Moderate facet hypertrophy is noted bilaterally. Chronic loss of disc height present. L5-S1: No disc herniation. No spinal canal stenosis or neural foraminal narrowing. IMPRESSION: 1. Progressed severe central and right foraminal stenosis and moderate left foraminal stenosis at L3-L4. 2. Progressed mild central and bilateral foraminal stenosis at L2-L3. 3. Mild central and moderate bilateral foraminal stenosis at L4-5 is stable. Electronically signed by: Lonni Necessary MD 08/13/2024 06:45 PM EDT RP Workstation: [id number]   DG Chest 2 View Result Date: 08/13/2024 CLINICAL DATA:  Chest pain and shortness of breath. EXAM: DG CHEST 2V COMPARISON:  Chest x-ray 07/31/2024.  Chest CT 07/31/2024. FINDINGS: Right chest port catheter tip ends in the distal SVC, unchanged. Volume loss throughout the right hemithorax with pleural thickening persists. Right upper lobe and perihilar mass is unchanged. There is a new band of atelectasis in the left lung base. The left lung is otherwise clear. No pneumothorax. No pleural effusion. No acute fractures are seen. IMPRESSION: 1. New band of atelectasis in the left lung base. 2. Stable right upper lobe and perihilar mass. 3. Stable volume loss throughout the right hemithorax with pleural thickening. Electronically Signed   By: Greig Pique M.D.   On: 08/13/2024 17:13   CT Angio Chest/Abd/Pel for Dissection W and/or Wo Contrast Result Date: 07/31/2024 CLINICAL DATA:  Chest pain back pain lung cancer EXAM: CT ANGIOGRAPHY CHEST, ABDOMEN AND PELVIS TECHNIQUE: Non-contrast CT of the chest was initially obtained. Multidetector CT imaging through the chest, abdomen and pelvis was performed using the standard protocol during bolus administration of intravenous  contrast. Multiplanar reconstructed images and MIPs were obtained and reviewed to evaluate the vascular anatomy. RADIATION DOSE REDUCTION: This exam was performed according to the departmental dose-optimization program which includes automated exposure control, adjustment of the mA and/or kV according to patient size and/or use of iterative reconstruction technique. CONTRAST:  OMNIPAQUE  IOHEXOL  350 MG/ML SOLN COMPARISON:  Chest x-ray 07/31/2024, CT 06/09/2024, 03/08/2024, 05/19/2023, 05/01/2023, multiple exams dating back to 03/26/2020 FINDINGS: CTA CHEST FINDINGS Cardiovascular: Non contrasted images of the chest demonstrate no acute intramural hematoma. Mild aortic atherosclerosis. No dissection is seen. Ectatic ascending aorta up to 3.8 cm. Coronary vascular calcification. Cardiomegaly. Trace pericardial effusion. Right-sided central venous port with tip in the right atrium. Mediastinum/Nodes: Patent trachea. No thyroid  mass. Progressive soft tissue density within the mediastinum, most evident in the subcarinal region,  series 11 image 56 and surrounding the right bronchus, series 11 image 50 through 58. Increased right hilar soft tissue density compared to prior. Esophagus within normal limits. Lungs/Pleura: Volume loss right hemithorax as before. Suspect slight increased pleural thickening compared to prior. Right Peri hilar distortion and bronchiectasis corresponding to post radiation change but progressive confluent and more masslike consolidation with decreased air bronchograms compared to prior and raising concern for tumor recurrence. Overall consolidative process is increased compared to the prior CT exams. Underlying mild fibrotic change at the right middle lobe but with interval increase in interstitial and ground-glass density. New irregular focus of airspace disease and ground-glass density in the anterior right base, series 7, image 93 measuring 10 mm. This is contiguous with irregular airspace  disease in the anterior right lung base, series 7, image 97, with surrounding ground-glass. Within the posterior, medial right base, there is new heterogeneous ground-glass density and multiple small irregular nodular densities, for example 9 mm density on series 7, image 108 and adjacent 6 mm densities on series 7, image 108 and 107. Chronic mild hyperdense material in the right posterior pleural space. Musculoskeletal: Sternum appears intact. Multilevel degenerative changes. No definite acute osseous abnormality is seen. Review of the MIP images confirms the above findings. CTA ABDOMEN AND PELVIS FINDINGS VASCULAR Aorta: Normal caliber aorta without aneurysm, dissection, vasculitis or significant stenosis. Moderate aortic atherosclerosis. Celiac: Patent without evidence of aneurysm, dissection, vasculitis or significant stenosis. SMA: Patent without evidence of aneurysm, dissection, vasculitis or significant stenosis. Renals: Both renal arteries are patent without evidence of aneurysm, dissection, vasculitis, fibromuscular dysplasia or significant stenosis. IMA: Patent without evidence of aneurysm, dissection, vasculitis or significant stenosis. Inflow: Patent without evidence of aneurysm, dissection, vasculitis or significant stenosis. Veins: Suboptimally assessed Review of the MIP images confirms the above findings. NON-VASCULAR Hepatobiliary: Nodular hepatic contour suggests possible cirrhosis change. Hepatic metastatic disease. Right hepatic dome lesion measures about 6.8 x 4.6 cm, previously 5.8 x 4.7 cm but was better seen on venous phase imaging. Vague caudate lesion measures 2.1 cm on series 11, image 123, 17 mm on the prior exam. No calcified gallstone or biliary dilatation. Fiducial markers in the inferior right hepatic lobe, adjacent to hypodense metastatic focus, this lesion measures 19 mm on series 11, image 123, previously 14 mm. Pancreas: Atrophic.  No acute inflammation Spleen: Heterogenous  enhancement likely due to arterial phase. Adrenals/Urinary Tract: Stable negative left adrenal gland. Stable 15 mm right adrenal nodule. The kidneys show no hydronephrosis. Right renal cysts for which no imaging follow-up is recommended. The bladder is unremarkable. Stomach/Bowel: The stomach is decompressed but suspect mild diffuse gastric wall thickening. There is no dilated small bowel. No acute bowel wall thickening. Lymphatic: Multiple enlarged peripancreatic and porta hepatis nodes. Node adjacent to the proximal pancreas measures 31 mm on series 11, image 30, previously 21 mm. Node adjacent to the inferior pancreatic head measures 36 mm on series 11, image 146, previously 26 mm. Reproductive: Negative prostate Other: Negative for pelvic effusion or free air Musculoskeletal: No acute osseous abnormality. No suspicious bone lesion. Advanced disc space narrowing at L4-L5 with moderate severe canal stenosis due to combination of disc disease and posterior ligamentum flavum and facet disease. Disc space narrowing at L3-L4 with gas locules in the right foramen. Severe canal stenosis suspected likely due to disc disease and facet hypertrophy. 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 Review of the MIP images confirms the above findings. IMPRESSION: 1. Negative for acute aortic dissection or aneurysm. 2.  Post therapeutic changes in the right hilar/suprahilar lung but with interim progressive masslike consolidative process in the right perihilar region with decreased air bronchograms compared to prior, raising concern for tumor recurrence. Progressive soft tissue density within the mediastinum concerning for tumor recurrence/metastatic disease. 3. New irregular foci of airspace disease/nodularity and ground-glass density in the right middle lobe and right lung base as described above, indeterminate for tumor versus infectious or inflammatory process. 4. Hepatic metastatic disease with slight interval increase in size of hepatic  lesions 5. Increase in size of peripancreatic and porta hepatis lymph nodes consistent with disease progression. 6. Advanced degenerative changes of the lumbar spine with suspected moderate to severe canal stenosis at L3-L4 and L4-L5. These findings would be better assessed with MRI Aortic Atherosclerosis (ICD10-I70.0). Electronically Signed   By: Luke Bun M.D.   On: 07/31/2024 18:48   DG Chest Portable 1 View Result Date: 07/31/2024 CLINICAL DATA:  Chest pain EXAM: PORTABLE CHEST 1 VIEW COMPARISON:  01/04/2024, CT 06/09/2024, radiograph 08/10/2023, 09/18/2022 FINDINGS: Right-sided central venous port with tip at the cavoatrial region. Volume loss right thorax with distortion and masslike consolidation in the right hilar/suprahilar lung appears slightly more confluent compared to the chest x-ray from January. Pleural thickening and or fluid not significantly changed. Stable enlarged cardiomediastinal silhouette. No pneumothorax IMPRESSION: Volume loss right thorax with distortion and masslike consolidation in the right hilar/suprahilar lung appears slightly more confluent compared to the chest x-ray from January but probably stable compared with scout image from CT in June and likely related to post treatment changes. Contrast-enhanced chest CT follow-up if indicated. Similar right pleural fluid and or thickening. Cardiomegaly Electronically Signed   By: Luke Bun M.D.   On: 07/31/2024 17:30     ASSESSMENT/PLAN:  This is a very pleasant 73 year old Caucasian male diagnosed with extensive stage (T4, N3, M1C) small cell lung cancer presented with large right upper lobe lung mass in addition to mediastinal and right supraclavicular lymphadenopathy and multiple metastatic liver lesions diagnosed in April 2021.    The patient initially underwent systemic chemotherapy with carboplatin  for an AUC of 5 on day 1, etoposide  100 mg per metered squared on days 1, 2, and 3 in addition to Cosela  for  myeloprotection.  He also received immunotherapy with Imfinzi  on day 1 of every chemotherapy cycle.  He is status post 8 cycles.  Starting from cycle #5 he was on single agent immunotherapy with Imfinzi  IV every 4 weeks   The patient had evidence of disease progression on his scan from December 2021.   He was then started on systemic chemotherapy with carboplatin  for an AUC of 5 on day 1, etoposide  100 mg per metered squared on days 1, 2, and 3 in addition to Tecentriq  1200 mg on day 1, the patient is status post 8 cycles.  Starting from cycle #5, the patient started maintenance immunotherapy with Tecentriq .  This was discontinued due to evidence of disease progression.   The patient then underwent Zepzelca  IV every 3 weeks.  He is status post his 12 cycles and he tolerated it well except for fatigue a few days after treatment.  This was discontinued in February 2023 due to evidence of disease progression.    The patient then underwent treatment with irinotecan  65 mg per metered squared on days 1 and 8 IV every 3 weeks.  The patient is status post 3 cycles and tolerated it fairly well without any concerning adverse side effects.  This was  discontinued secondary to disease progression.   Dr. Sherrod saw the patient after this to discuss the options including palliative care and hospice versus single agent Gemzar, or Taxol.  He also discussed repeating his initial treatment with carboplatin  and etoposide  since he had a good response to treatment in the past.   He was referred to Dr. Boyd at [facility]  for consideration of enrollment in the clinical trial with tarlatamab.    He also saw Dr. Lysbeth at [facility] for consideration of enrollment in the [id number] trial.  The patient was not interested due to the travel burden.   The patient was interested in restarting systemic chemotherapy with carboplatin  for an AUC of 5 on day 1 and etoposide  100 mg per metered squared on days 1, 2, and 3 with  Cosela . He is status post 14 cycles and tolerated it well. His dose of carboplatin  was reduced to an AUC of 4 and etoposide  80 mg per metered squared.     He underwent SBRT to a liver lesion  which was completed on 01/27/23.    The patient was seen by Dr. Boyd at [facility] and treated with tarlatamab which is a by specific T-cell engager (BiTE) for 8 months. Unfortunately the repeat CT scan of the chest, abdomen and pelvis in December 2024 showed interval mild to moderate increase in the size of the segment 7 liver lesion in addition to interval mild increase in the size of portacaval and common hepatic artery lymph nodes with stable size retroperitoneal lymph nodes and stable size right adrenal nodule. There was no evidence for disease progression in the chest. There was focal enhancement in the right iliac bone concerning for metastatic disease.    He completed palliative radiation to the right iliac bone lesion in February 2025.   Dr. Sherrod recommended Topotecan  regimen: 5 days in a row every 3 weeks.  He tolerates it fairly well except he did have some cytopenias.  He is status post 8 cycles. His dose was reduced starting with cycle #2.     He had some progressive disease in the upper abdominal lymph nodes/liver.    He underwent SBRT and completed this on 05/16/2024.   The patient was recently admitted to the hospital for intractable back pain.  He had a CT scan that showed disease progression.     Dr. Sherrod discussed that we are running out of options.  Dr. Sherrod discussed oral treatment with Temodar which may not be effective or try to start him back on carboplatin  and etoposide  at a reduced dose.  The patient opted to try carboplatin  and etoposide  at a reduced dose.   Patient was seen with Dr. Sherrod.  Labs were reviewed.  The patient is not interested in hospice as he would like to continue treatment.  We will go ahead and place a referral for a home health nurse as well as  PT.  I encouraged him to reach out to his spinal doctor as his incontinence may be related to his severe spinal stenosis.  For pain management he is expected to follow-up with palliative care on 08/30/2024.  We will also put in an order for a hospital bed at home.  No clear etiology was seen for the cause of his hematuria.  Discussed referral to urology.  A PSA was drawn today per his PCPs request.  He is advised to use Glucerna for low sugar nutritional supplements.  He has some neutropenia today but  does not require any G-CSF injections.  We reviewed neutropenic precautions.  He is advised to call if he develops any signs and symptoms of infection.  - Continue using external catheter as needed.  We will monitor his labs closely and I will arrange for standing orders for sample of blood bank.  We will consider transfusion if his hemoglobin is less than 8.    Will see him back with cycle #2.   The patient was advised to call immediately if he has any concerning symptoms in the interval. The patient voices understanding of current disease status and treatment options and is in agreement with the current care plan. All questions were answered. The patient knows to call the clinic with any problems, questions or concerns. We can certainly see the patient much sooner if necessary   No orders of the defined types were placed in this encounter.    Luman Holway L Anetria Harwick, PA-C 08/14/24  ADDENDUM: Hematology/Oncology Attending:  I had a face-to-face encounter with the patient today.  I reviewed his record, lab, and recommended his care plan.  This is a very pleasant 73 years old white male with extensive stage small cell lung cancer diagnosed in December 2021 status post several chemotherapy regimens as well as immunotherapy and bispecific with Tarlatamab.  Most recently he was in treatment with topotecan  but this was discontinued secondary to disease progression. We started the  patient again on systemic chemotherapy with reduced dose carboplatin  and Doutova side status post 1 cycle last week.  He tolerated the first week of his treatment well except for the increasing fatigue and weakness.  He also had some evidence of hematuria few days ago but improving.  He continues to have increased frequency.  I recommended for him to see urology for further evaluation and identifies etiology of the hematuria. He also has chemotherapy-induced neutropenia but not requiring any additional growth factor at this point. The patient continues to have significant fatigue and weakness and he is requesting physical therapy again.  We have another discussion about hospice care but the patient declines that option. He will come back for follow-up visit in 2 weeks for evaluation before starting cycle #2 of his treatment. He was advised to call immediately if he has any concerning symptoms in the interval. The total time spent in the appointment was 30 minutes including review of chart and various tests results, discussions about plan of care and coordination of care plan . Disclaimer: This note was dictated with voice recognition software. Similar sounding words can inadvertently be transcribed and may be missed upon review. Sherrod MARLA Sherrod, MD

## 2024-08-15 DIAGNOSIS — R32 Unspecified urinary incontinence: Secondary | ICD-10-CM

## 2024-08-16 ENCOUNTER — Inpatient Hospital Stay

## 2024-08-16 ENCOUNTER — Other Ambulatory Visit: Payer: PRIVATE HEALTH INSURANCE

## 2024-08-17 ENCOUNTER — Inpatient Hospital Stay (HOSPITAL_BASED_OUTPATIENT_CLINIC_OR_DEPARTMENT_OTHER): Admitting: Physician Assistant

## 2024-08-17 ENCOUNTER — Encounter: Payer: Self-pay | Admitting: Nurse Practitioner

## 2024-08-17 ENCOUNTER — Inpatient Hospital Stay: Attending: Radiation Oncology

## 2024-08-17 ENCOUNTER — Telehealth: Payer: Self-pay | Admitting: Medical Oncology

## 2024-08-17 ENCOUNTER — Inpatient Hospital Stay

## 2024-08-17 ENCOUNTER — Other Ambulatory Visit: Payer: Self-pay | Admitting: Primary Care

## 2024-08-17 ENCOUNTER — Encounter: Payer: Self-pay | Admitting: Internal Medicine

## 2024-08-17 ENCOUNTER — Other Ambulatory Visit: Payer: Self-pay | Admitting: *Deleted

## 2024-08-17 ENCOUNTER — Telehealth: Payer: Self-pay | Admitting: *Deleted

## 2024-08-17 VITALS — BP 99/62 | HR 64 | Temp 97.3°F | Resp 13 | Wt 225.2 lb

## 2024-08-17 DIAGNOSIS — I10 Essential (primary) hypertension: Secondary | ICD-10-CM

## 2024-08-17 DIAGNOSIS — D6481 Anemia due to antineoplastic chemotherapy: Secondary | ICD-10-CM | POA: Insufficient documentation

## 2024-08-17 DIAGNOSIS — E785 Hyperlipidemia, unspecified: Secondary | ICD-10-CM

## 2024-08-17 DIAGNOSIS — R319 Hematuria, unspecified: Secondary | ICD-10-CM | POA: Diagnosis not present

## 2024-08-17 DIAGNOSIS — R32 Unspecified urinary incontinence: Secondary | ICD-10-CM | POA: Diagnosis not present

## 2024-08-17 DIAGNOSIS — C7951 Secondary malignant neoplasm of bone: Secondary | ICD-10-CM | POA: Insufficient documentation

## 2024-08-17 DIAGNOSIS — G893 Neoplasm related pain (acute) (chronic): Secondary | ICD-10-CM | POA: Diagnosis not present

## 2024-08-17 DIAGNOSIS — D701 Agranulocytosis secondary to cancer chemotherapy: Secondary | ICD-10-CM | POA: Diagnosis not present

## 2024-08-17 DIAGNOSIS — R000 Tachycardia, unspecified: Secondary | ICD-10-CM | POA: Insufficient documentation

## 2024-08-17 DIAGNOSIS — K5900 Constipation, unspecified: Secondary | ICD-10-CM | POA: Diagnosis not present

## 2024-08-17 DIAGNOSIS — C3411 Malignant neoplasm of upper lobe, right bronchus or lung: Secondary | ICD-10-CM | POA: Insufficient documentation

## 2024-08-17 DIAGNOSIS — C787 Secondary malignant neoplasm of liver and intrahepatic bile duct: Secondary | ICD-10-CM | POA: Insufficient documentation

## 2024-08-17 DIAGNOSIS — Z5111 Encounter for antineoplastic chemotherapy: Secondary | ICD-10-CM | POA: Diagnosis not present

## 2024-08-17 DIAGNOSIS — M48061 Spinal stenosis, lumbar region without neurogenic claudication: Secondary | ICD-10-CM

## 2024-08-17 DIAGNOSIS — D649 Anemia, unspecified: Secondary | ICD-10-CM

## 2024-08-17 DIAGNOSIS — Z515 Encounter for palliative care: Secondary | ICD-10-CM | POA: Diagnosis not present

## 2024-08-17 DIAGNOSIS — E1169 Type 2 diabetes mellitus with other specified complication: Secondary | ICD-10-CM

## 2024-08-17 DIAGNOSIS — D696 Thrombocytopenia, unspecified: Secondary | ICD-10-CM

## 2024-08-17 LAB — CBC WITH DIFFERENTIAL (CANCER CENTER ONLY)
Abs Immature Granulocytes: 0.03 K/uL (ref 0.00–0.07)
Basophils Absolute: 0 K/uL (ref 0.0–0.1)
Basophils Relative: 1 %
Eosinophils Absolute: 0.1 K/uL (ref 0.0–0.5)
Eosinophils Relative: 3 %
HCT: 28 % — ABNORMAL LOW (ref 39.0–52.0)
Hemoglobin: 9.1 g/dL — ABNORMAL LOW (ref 13.0–17.0)
Immature Granulocytes: 2 %
Lymphocytes Relative: 13 %
Lymphs Abs: 0.3 K/uL — ABNORMAL LOW (ref 0.7–4.0)
MCH: 31.2 pg (ref 26.0–34.0)
MCHC: 32.5 g/dL (ref 30.0–36.0)
MCV: 95.9 fL (ref 80.0–100.0)
Monocytes Absolute: 0.1 K/uL (ref 0.1–1.0)
Monocytes Relative: 5 %
Neutro Abs: 1.5 K/uL — ABNORMAL LOW (ref 1.7–7.7)
Neutrophils Relative %: 76 %
Platelet Count: 94 K/uL — ABNORMAL LOW (ref 150–400)
RBC: 2.92 MIL/uL — ABNORMAL LOW (ref 4.22–5.81)
RDW: 16.5 % — ABNORMAL HIGH (ref 11.5–15.5)
WBC Count: 1.9 K/uL — ABNORMAL LOW (ref 4.0–10.5)
nRBC: 0 % (ref 0.0–0.2)

## 2024-08-17 LAB — CMP (CANCER CENTER ONLY)
ALT: 36 U/L (ref 0–44)
AST: 32 U/L (ref 15–41)
Albumin: 3.2 g/dL — ABNORMAL LOW (ref 3.5–5.0)
Alkaline Phosphatase: 181 U/L — ABNORMAL HIGH (ref 38–126)
Anion gap: 6 (ref 5–15)
BUN: 27 mg/dL — ABNORMAL HIGH (ref 8–23)
CO2: 31 mmol/L (ref 22–32)
Calcium: 8.6 mg/dL — ABNORMAL LOW (ref 8.9–10.3)
Chloride: 100 mmol/L (ref 98–111)
Creatinine: 0.87 mg/dL (ref 0.61–1.24)
GFR, Estimated: >60 mL/min (ref >60)
Glucose, Bld: 107 mg/dL — ABNORMAL HIGH (ref 70–99)
Potassium: 4.4 mmol/L (ref 3.5–5.1)
Sodium: 137 mmol/L (ref 135–145)
Total Bilirubin: 0.5 mg/dL (ref 0.0–1.2)
Total Protein: 6.1 g/dL — ABNORMAL LOW (ref 6.5–8.1)

## 2024-08-17 LAB — SAMPLE TO BLOOD BANK

## 2024-08-17 NOTE — Telephone Encounter (Signed)
 Referral and records faxed to [facility]

## 2024-08-17 NOTE — Progress Notes (Signed)
 Palliative Medicine [facility]  [phone] [phone]   Name: Adam Wise Date: 08/17/2024 MRN: [id number]  DOB: 05-30-1951  Patient Care Team: [name], NP as PCP - General (Internal Medicine) Anner Alm ORN, MD as PCP - Cardiology (Cardiology) Evertt Lonell BROCKS, RN as Oncology Nurse Navigator Pickenpack-Cousar, Fannie SAILOR, NP as Nurse Practitioner (Hospice and Palliative Medicine) Heilingoetter, Calton CROME, PA-C as Physician Assistant (Physician Assistant)    INTERVAL HISTORY: Adam Wise is a 73 y.o. 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 Palliative ask to see for symptom management and goals of care.   SOCIAL HISTORY:     reports that he has quit smoking. He has never used smokeless tobacco. He reports that he does not currently use alcohol. He reports that he does not currently use drugs.  ADVANCE DIRECTIVES:  Advanced directives on file naming Hiren Peplinski, [name], [name], and [name] as decision makers for this pt should Mr.Macintyre become unable to speak for himself.   CODE STATUS: Full code  PAST MEDICAL HISTORY: Past Medical History:  Diagnosis Date   Acute on chronic respiratory failure with hypoxia (HCC) 10/12/2021   Chickenpox    Chronic knee pain    Chronic low back pain    COPD exacerbation (HCC) 10/11/2021   COPD with exacerbation (HCC) 10/12/2021   Coronary artery calcification seen on CAT scan 11/2021   Coronary CTA 11/27/2021: Moderate to large right pleural effusion and compressive atelectasis right lung base. => Coronary Calcium Score 657.  Diffuse RCA calcification.  Minimal mild disease in the LAD and diagonal branches. == Overall limited study.  Notable artifact.   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 He has disease progression after the first line of chemotherapy in December 2021.   Testosterone  deficiency    Type 2 diabetes mellitus (HCC)     ALLERGIES:  is allergic to bupropion and hydrochlorothiazide.  MEDICATIONS:  Current Outpatient Medications  Medication Sig Dispense Refill   oxyCODONE -acetaminophen  (PERCOCET) 10-325 MG tablet Take 1-1.5 tablets by mouth every 4 (four) hours as needed for pain. 90 tablet 0   albuterol  (VENTOLIN  HFA) 108 (90 Base) MCG/ACT inhaler Inhale 2 puffs into the lungs every 4 (four) hours as needed for shortness of breath. 18 each 0   B-D 3CC LUER-LOK SYR 22GX1 22G X 1 3 ML MISC USE AS INSTRUCTED FOR TESTOSTERONE  INJECTION EVERY 2 WEEKS 10 each 2   Black Pepper-Turmeric (TURMERIC COMPLEX/BLACK PEPPER PO) Take 1 tablet by mouth in the morning and at bedtime.     Calcium Carb-Cholecalciferol (CALCIUM PLUS VITAMIN D3 PO) Take 1 tablet by mouth in the morning and at bedtime.     Coenzyme Q10 (COQ10) 200 MG CAPS Take 200 mg by mouth daily.     cyclobenzaprine  (FLEXERIL ) 10 MG tablet Take 1 tablet (10 mg total) by mouth 2 (two) times daily as needed for muscle spasms. 20 tablet 0  docusate sodium  (COLACE) 100 MG capsule Take 1 capsule (100 mg total) by mouth every 12 (twelve) hours. 60 capsule 0   ELIQUIS  5 MG TABS tablet TAKE 1 TABLET BY MOUTH TWICE A DAY 180 tablet 2   esomeprazole (NEXIUM) 20 MG capsule Take 20 mg by mouth in the morning and at bedtime.     gabapentin  (NEURONTIN ) 300 MG capsule Take 1 capsule (300 mg total) by mouth 2 (two) times daily. For back pain.  180 capsule 1   glucose blood (ONETOUCH ULTRA) test strip USE UP TO 4 TIMES DAILY AS DIRECTED 400 strip 5   Insulin  Pen Needle (BD PEN NEEDLE NANO U/F) 32G X 4 MM MISC Use with insulin  as prescribed Dx Code: E11.9 100 each 3   ipratropium-albuterol  (DUONEB) 0.5-2.5 (3) MG/3ML SOLN Take 3 mLs by nebulization every 4 (four) hours while awake for 3 days, THEN 3 mLs every 4 (four) hours as needed (shortness of breath or wheezing). 360 mL 0   KRILL OIL PO Take 1 capsule by mouth daily.     Lancets MISC USE UP TO 3 TIMES DAILY AS DIRECTED 100 each 2   lidocaine  (LIDODERM ) 5 % Place 1 patch onto the skin daily. Remove & Discard patch within 12 hours or as directed by MD 30 patch 0   losartan  (COZAAR ) 50 MG tablet TAKE 1 TABLET (50 MG TOTAL) BY MOUTH DAILY. FOR BLOOD PRESSURE. 90 tablet 3   metFORMIN  (GLUCOPHAGE ) 1000 MG tablet TAKE 1 TABLET (1,000 MG TOTAL) BY MOUTH 2 (TWO) TIMES DAILY WITH A MEAL. FOR DIABETES 180 tablet 0   mirtazapine  (REMERON ) 15 MG tablet TAKE 1 TABLET BY MOUTH EVERYDAY AT BEDTIME 90 tablet 1   morphine  (MS CONTIN ) 30 MG 12 hr tablet Take 1 tablet (30 mg total) by mouth every 8 (eight) hours as needed. 90 tablet 0   Multiple Vitamin (MULTI-VITAMINS) TABS Take 1 tablet by mouth daily with breakfast.     Naloxone  HCl 3 MG/0.1ML LIQD Place 1 spray into both nostrils once. For known/suspected opiod overdose. Every 2-3 minutes in alternating nostril till EMS arrives.     ondansetron  (ZOFRAN ) 8 MG tablet Take 1 tablet (8 mg total) by mouth every 8 (eight) hours as needed for nausea or vomiting. 30 tablet 3   pioglitazone  (ACTOS ) 45 MG tablet TAKE 1 TABLET (45 MG TOTAL) BY MOUTH DAILY. FOR DIABETES. 90 tablet 1   polyethylene glycol powder (GLYCOLAX /MIRALAX ) 17 GM/SCOOP powder Take 17 g by mouth daily as needed for moderate constipation or mild constipation.     pravastatin  (PRAVACHOL ) 40 MG tablet TAKE 1 TABLET BY MOUTH EVERY DAY IN THE EVENING FOR CHOLESTEROL 90 tablet 3   PRESCRIPTION  MEDICATION CPAP- At bedtime     testosterone  cypionate (DEPOTESTOSTERONE CYPIONATE) 200 MG/ML injection Inject 200 mg into the muscle every 14 (fourteen) days.     No current facility-administered medications for this visit.   Facility-Administered Medications Ordered in Other Visits  Medication Dose Route Frequency Provider Last Rate Last Admin   heparin  lock flush 100 unit/mL  500 Units Intracatheter Once Sherrod Sherrod, MD       sodium chloride  flush (NS) 0.9 % injection 10 mL  10 mL Intracatheter Once Sherrod Sherrod, MD        VITAL SIGNS: There were no vitals taken for this visit. There were no vitals filed for this visit.  Estimated body mass index is 34.76 kg/m as calculated from the following:   Height as of 08/04/24:  5' 9 (1.753 m).   Weight as of 08/04/24: 235 lb 6.4 oz (106.8 kg).   PERFORMANCE STATUS (ECOG) : 1 - Symptomatic but completely ambulatory   Physical Exam General: NAD Cardiovascular: regular rate and rhythm Pulmonary: normal breathing pattern Extremities: no edema, no joint deformities Skin: no rashes Neurological: AAO x3  IMPRESSION: Discussed the use of AI scribe software for clinical note transcription with the patient, who gave verbal consent to proceed.  History of Present Illness Adam Wise is a 73 year old male with lung cancer was seen during his infusion for symptom management follow-up.  Denies concerns of nausea, vomiting, constipation, or diarrhea.  He experiences significant pain and has been hospitalized twice. He is currently managing his pain with morphine  and gabapentin . He is scheduled to receive an injection from an anesthesiologist the following day, which he hopes will alleviate his symptoms.  We discussed his current oral regimen which includes MS Contin  30 mg every 12 hours, Percocet 10/325mg  every 4-6 hours. He is having to take medication around the clock. Patient instructed to increase MS Contin  to every 8 hours and  may take Percocet 1-1.5 tablets every 4 hours as needed. He confirms he has not taken Gabapentin  as prescribed. Encouraged to restart to assist in pain however with awareness pain management may make additional adjustments most spinal injection. He verbalized understanding.   Patient aware to focus on bowel regimen in the setting of opioid use to prevent constipation.   I discussed the importance of continued conversation with family and their medical providers regarding overall plan of care and treatment options, ensuring decisions are within the context of the patients values and GOCs.  Assessment & Plan Cancer Related Pain  Patient reports back pain is well controlled.  - Continue current pain management regimen with Tylenol  as needed. - Use Percocet sparingly for breakthrough pain. -Continue MS Contin  30 mg every 8-12 hours -Miralax  daily for bowel regimen   Follow-up Follow-up planned in a couple of weeks unless needed sooner. - Schedule follow-up in 3-4 weeks. Sooner if needed.    Cancer pain  Cancer pain syndrome with recent hospitalizations due to pain exacerbations. Currently managed with morphine  and gabapentin . Scheduled for a pain injection by anesthesiology. If ineffective, radiofrequency lesions may be considered. Discussion of potential future use of an intrathecal pain pump if pain persists, which has been successful in other patients in reducing or eliminating the need for oral pain medications.  - Refill morphine  prescription. MS Contin  30 mg every 8 hours.  - Continue gabapentin  300mg  three times daily.  -Percocet 10/325mg  1-1.5 tablets every 4 hours as needed.  - Proceed with scheduled anesthesiology appointment for pain injection - Consider radiofrequency lesions if injection is ineffective - Evaluate for intrathecal pain pump if pain persists over the next month  I will plan to see patient back in 2-3 weeks. Sooner if needed.   Patient expressed understanding and  was in agreement with this plan. He also understands that He can call the clinic at any time with any questions, concerns, or complaints.   Any controlled substances utilized were prescribed in the context of palliative care. PDMP has been reviewed.   Visit consisted of counseling and education dealing with the complex and emotionally intense issues of symptom management and palliative care in the setting of serious and potentially life-threatening illness.  Levon Borer, AGPCNP-BC  Palliative Medicine Team/[facility]

## 2024-08-17 NOTE — Telephone Encounter (Signed)
Referral faxed to [facility] °

## 2024-08-18 DIAGNOSIS — J9611 Chronic respiratory failure with hypoxia: Secondary | ICD-10-CM | POA: Diagnosis not present

## 2024-08-18 DIAGNOSIS — I10 Essential (primary) hypertension: Secondary | ICD-10-CM | POA: Diagnosis not present

## 2024-08-18 DIAGNOSIS — I2510 Atherosclerotic heart disease of native coronary artery without angina pectoris: Secondary | ICD-10-CM | POA: Diagnosis not present

## 2024-08-18 DIAGNOSIS — Z9089 Acquired absence of other organs: Secondary | ICD-10-CM | POA: Diagnosis not present

## 2024-08-18 DIAGNOSIS — K769 Liver disease, unspecified: Secondary | ICD-10-CM | POA: Diagnosis not present

## 2024-08-18 DIAGNOSIS — Z96653 Presence of artificial knee joint, bilateral: Secondary | ICD-10-CM | POA: Diagnosis not present

## 2024-08-18 DIAGNOSIS — K219 Gastro-esophageal reflux disease without esophagitis: Secondary | ICD-10-CM | POA: Diagnosis not present

## 2024-08-18 DIAGNOSIS — G8929 Other chronic pain: Secondary | ICD-10-CM | POA: Diagnosis not present

## 2024-08-18 DIAGNOSIS — E785 Hyperlipidemia, unspecified: Secondary | ICD-10-CM | POA: Diagnosis not present

## 2024-08-18 DIAGNOSIS — G4733 Obstructive sleep apnea (adult) (pediatric): Secondary | ICD-10-CM | POA: Diagnosis not present

## 2024-08-18 DIAGNOSIS — Z7984 Long term (current) use of oral hypoglycemic drugs: Secondary | ICD-10-CM | POA: Diagnosis not present

## 2024-08-18 DIAGNOSIS — Z7901 Long term (current) use of anticoagulants: Secondary | ICD-10-CM | POA: Diagnosis not present

## 2024-08-18 DIAGNOSIS — C3411 Malignant neoplasm of upper lobe, right bronchus or lung: Secondary | ICD-10-CM | POA: Diagnosis not present

## 2024-08-18 DIAGNOSIS — M48061 Spinal stenosis, lumbar region without neurogenic claudication: Secondary | ICD-10-CM | POA: Diagnosis not present

## 2024-08-18 DIAGNOSIS — E119 Type 2 diabetes mellitus without complications: Secondary | ICD-10-CM | POA: Diagnosis not present

## 2024-08-18 DIAGNOSIS — J449 Chronic obstructive pulmonary disease, unspecified: Secondary | ICD-10-CM | POA: Diagnosis not present

## 2024-08-18 DIAGNOSIS — E039 Hypothyroidism, unspecified: Secondary | ICD-10-CM | POA: Diagnosis not present

## 2024-08-19 ENCOUNTER — Telehealth: Payer: Self-pay

## 2024-08-19 ENCOUNTER — Telehealth: Payer: Self-pay | Admitting: Cardiology

## 2024-08-19 DIAGNOSIS — C3411 Malignant neoplasm of upper lobe, right bronchus or lung: Secondary | ICD-10-CM | POA: Diagnosis not present

## 2024-08-19 DIAGNOSIS — J449 Chronic obstructive pulmonary disease, unspecified: Secondary | ICD-10-CM | POA: Diagnosis not present

## 2024-08-19 DIAGNOSIS — E119 Type 2 diabetes mellitus without complications: Secondary | ICD-10-CM | POA: Diagnosis not present

## 2024-08-19 DIAGNOSIS — I2510 Atherosclerotic heart disease of native coronary artery without angina pectoris: Secondary | ICD-10-CM | POA: Diagnosis not present

## 2024-08-19 DIAGNOSIS — I10 Essential (primary) hypertension: Secondary | ICD-10-CM | POA: Diagnosis not present

## 2024-08-19 DIAGNOSIS — J9611 Chronic respiratory failure with hypoxia: Secondary | ICD-10-CM | POA: Diagnosis not present

## 2024-08-19 NOTE — Telephone Encounter (Signed)
 Pt c/o Shortness Of Breath: STAT if SOB developed within the last 24 hours or pt is noticeably SOB on the phone  1. Are you currently SOB (can you hear that pt is SOB on the phone)? No  2. How long have you been experiencing SOB? Ongoing, worse today  3. Are you SOB when sitting or when up moving around? Moving  4. Are you currently experiencing any other symptoms? Tachycardia   .STAT if HR is under 50 or over 120 (normal HR is 60-100 beats per minute)   1) What is your heart rate? 120    2) Do you have a log of your heart rate readings (document readings)? No   3) Do you have any other symptoms? SOB

## 2024-08-19 NOTE — Telephone Encounter (Signed)
 Spoke with Jill from Physical Therapy regarding the patient's evaluation. Vital signs recorded: heart rate 125 bpm, blood pressure 100/60, oxygen  saturation within normal limits, and temperature 97.19F. Kate requested guidance concerning the elevated heart rate.  Consulted with Cassie, PA, who recommended contacting the patient's cardiologist, Dr. Anner, for further evaluation.  Kate voiced understanding of the recommendation.

## 2024-08-19 NOTE — Telephone Encounter (Signed)
 Spoke with patients wife and she answered saying that she just doesn't know what to do. She reports that he has cancer, low BP, 2 ambulance rides in the last couple of weeks, not eating, weak, and elevated heart rate. She had requested nurse to come out and today when she had patient get up to walk his heart rate was 120's. Currently it is 119 and he is resting and pulse oximetry is 94%.   She states that he has not had any chest pain. She reports that he is always tired and sleeps a lot from the Chemo that he is having. Inquired if he had any shortness of breath and she states that he does not have any but that he is always sleeping. He does use inhalers and CPAP machine.  He has not been seen here in our office in over 2 years. She realizes that we may not be able to help without a visit. Scheduled first available with Dr. Anner on 9/18. We discussed strict ED precautions and she verbalized understanding.   She again stated I just don't know what to do.  Provided emotional support.   Advised that I would send this message over to Dr. Anner but that he may not be able to assist due to not seeing him in a very long time. She verbalized understanding and was very Adult nurse.

## 2024-08-22 ENCOUNTER — Ambulatory Visit: Payer: PRIVATE HEALTH INSURANCE | Admitting: Physician Assistant

## 2024-08-22 ENCOUNTER — Ambulatory Visit: Payer: PRIVATE HEALTH INSURANCE

## 2024-08-22 ENCOUNTER — Inpatient Hospital Stay

## 2024-08-22 ENCOUNTER — Telehealth: Payer: Self-pay

## 2024-08-22 ENCOUNTER — Other Ambulatory Visit: Payer: PRIVATE HEALTH INSURANCE

## 2024-08-22 DIAGNOSIS — D696 Thrombocytopenia, unspecified: Secondary | ICD-10-CM

## 2024-08-22 DIAGNOSIS — C3411 Malignant neoplasm of upper lobe, right bronchus or lung: Secondary | ICD-10-CM | POA: Diagnosis not present

## 2024-08-22 DIAGNOSIS — C787 Secondary malignant neoplasm of liver and intrahepatic bile duct: Secondary | ICD-10-CM | POA: Diagnosis not present

## 2024-08-22 DIAGNOSIS — Z5111 Encounter for antineoplastic chemotherapy: Secondary | ICD-10-CM | POA: Diagnosis not present

## 2024-08-22 DIAGNOSIS — I2510 Atherosclerotic heart disease of native coronary artery without angina pectoris: Secondary | ICD-10-CM | POA: Diagnosis not present

## 2024-08-22 DIAGNOSIS — E119 Type 2 diabetes mellitus without complications: Secondary | ICD-10-CM | POA: Diagnosis not present

## 2024-08-22 DIAGNOSIS — Z515 Encounter for palliative care: Secondary | ICD-10-CM | POA: Diagnosis not present

## 2024-08-22 DIAGNOSIS — I10 Essential (primary) hypertension: Secondary | ICD-10-CM | POA: Diagnosis not present

## 2024-08-22 DIAGNOSIS — D649 Anemia, unspecified: Secondary | ICD-10-CM

## 2024-08-22 DIAGNOSIS — G893 Neoplasm related pain (acute) (chronic): Secondary | ICD-10-CM | POA: Diagnosis not present

## 2024-08-22 DIAGNOSIS — J9611 Chronic respiratory failure with hypoxia: Secondary | ICD-10-CM | POA: Diagnosis not present

## 2024-08-22 DIAGNOSIS — J449 Chronic obstructive pulmonary disease, unspecified: Secondary | ICD-10-CM | POA: Diagnosis not present

## 2024-08-22 DIAGNOSIS — C7951 Secondary malignant neoplasm of bone: Secondary | ICD-10-CM | POA: Diagnosis not present

## 2024-08-22 LAB — CMP (CANCER CENTER ONLY)
ALT: 36 U/L (ref 0–44)
AST: 31 U/L (ref 15–41)
Albumin: 3.4 g/dL — ABNORMAL LOW (ref 3.5–5.0)
Alkaline Phosphatase: 227 U/L — ABNORMAL HIGH (ref 38–126)
Anion gap: 8 (ref 5–15)
BUN: 23 mg/dL (ref 8–23)
CO2: 30 mmol/L (ref 22–32)
Calcium: 8.7 mg/dL — ABNORMAL LOW (ref 8.9–10.3)
Chloride: 101 mmol/L (ref 98–111)
Creatinine: 0.8 mg/dL (ref 0.61–1.24)
GFR, Estimated: >60 mL/min (ref >60)
Glucose, Bld: 183 mg/dL — ABNORMAL HIGH (ref 70–99)
Potassium: 4.7 mmol/L (ref 3.5–5.1)
Sodium: 139 mmol/L (ref 135–145)
Total Bilirubin: 0.4 mg/dL (ref 0.0–1.2)
Total Protein: 6.2 g/dL — ABNORMAL LOW (ref 6.5–8.1)

## 2024-08-22 LAB — CBC WITH DIFFERENTIAL (CANCER CENTER ONLY)
Abs Immature Granulocytes: 0 K/uL (ref 0.00–0.07)
Basophils Absolute: 0 K/uL (ref 0.0–0.1)
Basophils Relative: 1 %
Eosinophils Absolute: 0 K/uL (ref 0.0–0.5)
Eosinophils Relative: 1 %
HCT: 26.4 % — ABNORMAL LOW (ref 39.0–52.0)
Hemoglobin: 8.4 g/dL — ABNORMAL LOW (ref 13.0–17.0)
Immature Granulocytes: 0 %
Lymphocytes Relative: 14 %
Lymphs Abs: 0.2 K/uL — ABNORMAL LOW (ref 0.7–4.0)
MCH: 31.2 pg (ref 26.0–34.0)
MCHC: 31.8 g/dL (ref 30.0–36.0)
MCV: 98.1 fL (ref 80.0–100.0)
Monocytes Absolute: 0.3 K/uL (ref 0.1–1.0)
Monocytes Relative: 17 %
Neutro Abs: 1.1 K/uL — ABNORMAL LOW (ref 1.7–7.7)
Neutrophils Relative %: 67 %
Platelet Count: 48 K/uL — ABNORMAL LOW (ref 150–400)
RBC: 2.69 MIL/uL — ABNORMAL LOW (ref 4.22–5.81)
RDW: 17.1 % — ABNORMAL HIGH (ref 11.5–15.5)
WBC Count: 1.6 K/uL — ABNORMAL LOW (ref 4.0–10.5)
nRBC: 0 % (ref 0.0–0.2)

## 2024-08-22 LAB — SAMPLE TO BLOOD BANK

## 2024-08-22 NOTE — Telephone Encounter (Signed)
 Spoke with patient's wife regarding lab results. Per Cassie, PA, reviewed bleeding and neutropenic precautions with patient. No blood transfusion required at this time. Patient's wife verbalized understanding.

## 2024-08-23 ENCOUNTER — Ambulatory Visit: Payer: PRIVATE HEALTH INSURANCE

## 2024-08-23 ENCOUNTER — Other Ambulatory Visit: Payer: Self-pay | Admitting: Internal Medicine

## 2024-08-24 ENCOUNTER — Inpatient Hospital Stay

## 2024-08-24 ENCOUNTER — Ambulatory Visit: Payer: Self-pay | Admitting: Primary Care

## 2024-08-24 ENCOUNTER — Ambulatory Visit: Payer: PRIVATE HEALTH INSURANCE

## 2024-08-24 DIAGNOSIS — M48061 Spinal stenosis, lumbar region without neurogenic claudication: Secondary | ICD-10-CM | POA: Diagnosis not present

## 2024-08-24 DIAGNOSIS — G894 Chronic pain syndrome: Secondary | ICD-10-CM | POA: Diagnosis not present

## 2024-08-24 DIAGNOSIS — M47814 Spondylosis without myelopathy or radiculopathy, thoracic region: Secondary | ICD-10-CM | POA: Diagnosis not present

## 2024-08-24 DIAGNOSIS — M5136 Other intervertebral disc degeneration, lumbar region with discogenic back pain only: Secondary | ICD-10-CM | POA: Diagnosis not present

## 2024-08-24 DIAGNOSIS — Z794 Long term (current) use of insulin: Secondary | ICD-10-CM

## 2024-08-24 LAB — PSA: Prostate Specific Ag, Serum: 1.5 ng/mL (ref 0.0–4.0)

## 2024-08-24 LAB — SPECIMEN STATUS REPORT

## 2024-08-25 ENCOUNTER — Ambulatory Visit: Payer: PRIVATE HEALTH INSURANCE

## 2024-08-25 ENCOUNTER — Telehealth: Payer: Self-pay | Admitting: Medical Oncology

## 2024-08-25 ENCOUNTER — Other Ambulatory Visit: Payer: Self-pay | Admitting: Medical Oncology

## 2024-08-25 DIAGNOSIS — I10 Essential (primary) hypertension: Secondary | ICD-10-CM | POA: Diagnosis not present

## 2024-08-25 DIAGNOSIS — C3411 Malignant neoplasm of upper lobe, right bronchus or lung: Secondary | ICD-10-CM | POA: Diagnosis not present

## 2024-08-25 DIAGNOSIS — E119 Type 2 diabetes mellitus without complications: Secondary | ICD-10-CM | POA: Diagnosis not present

## 2024-08-25 DIAGNOSIS — J449 Chronic obstructive pulmonary disease, unspecified: Secondary | ICD-10-CM | POA: Diagnosis not present

## 2024-08-25 DIAGNOSIS — J9611 Chronic respiratory failure with hypoxia: Secondary | ICD-10-CM | POA: Diagnosis not present

## 2024-08-25 DIAGNOSIS — I2510 Atherosclerotic heart disease of native coronary artery without angina pectoris: Secondary | ICD-10-CM | POA: Diagnosis not present

## 2024-08-25 MED ORDER — BLOOD GLUCOSE MONITOR KIT: PACK | 0 refills | Status: DC

## 2024-08-25 NOTE — Telephone Encounter (Signed)
 Requested Glucometer for home use for pt to check his glucose. Order sent.

## 2024-08-26 ENCOUNTER — Ambulatory Visit: Payer: PRIVATE HEALTH INSURANCE

## 2024-08-26 ENCOUNTER — Telehealth: Payer: Self-pay | Admitting: Medical Oncology

## 2024-08-26 MED ORDER — FREESTYLE LIBRE 3 PLUS SENSOR MISC: 1 refills | Status: DC

## 2024-08-26 NOTE — Telephone Encounter (Signed)
 LVM on Melissa's phone that Glucometer order needs to come from pt's PCP.

## 2024-08-28 NOTE — Progress Notes (Signed)
 [facility] OFFICE PROGRESS NOTE  Adam Wise POUR, NP [address] Adam Wise  DIAGNOSIS: Relapsed extensive stage (T4, N3, M1c)  small cell lung cancer diagnosed in April 2021 and presented with large right upper lobe/right hilar mass with ipsilateral and contralateral mediastinal and right supraclavicular lymphadenopathy in addition to multiple liver lesions. He has disease progression after the first line of chemotherapy in December 2021.   PRIOR THERAPY:  1) Palliative radiotherapy to the right upper lobe lung mass under the care of Dr. Dewey. 2) Systemic chemotherapy with carboplatin  for AUC of 5 on day 1, etoposide  100 mg/M2 on days 1, 2 and 3 in addition to Imfinzi  1500 mg IV every 3 weeks with chemotherapy then every 4 weeks for maintenance if the patient has no evidence for progression.  He will also receive Cosela  240 mg/m2 on the days of the chemotherapy.  Status post 9 cycles.  Starting from cycle #5 the patient will be on maintenance treatment with immunotherapy with Imfinzi  1500 mg IV every 4 weeks. Last dose of chemotherapy was given on November 13, 2020. This treatment was discontinued secondary to disease progression 3) Systemic chemotherapy with carboplatin  for AUC of 5 on day 1, etoposide  100 mg/M2 on days 1, 2 and 3, Tecentriq  1200 mg IV every 3 weeks as well as Cosela  250 mg/M2 on the days of the chemotherapy every 3 weeks.  First dose December 18, 2020.  Status post 8 cycles. 4) Zepzelca  (lurbinectedin ) 3.2 mgm2 IV every 3 weeks. Last dose on 01/23/22. Status post 12 cycles.  5) Palliative systemic chemotherapy with irinotecan  65 mg/m2 on days 1 and 8 IV every 3 weeks.  Status post 3 cycles.  Last dose was given April 01, 2022 discontinued secondary to disease progression. 6) SBRT to the progressive liver lesions under the care of Dr. Dewey.  Last fraction January 27, 2023. 7) systemic chemotherapy with carboplatin  for AUC of 5 on day 1, etoposide  100  Mg/M2 on days 1, 2 and 3 with Cosela  before the chemotherapy.  First dose expected to start on 05/28/2022.  Status post 15 cycles.  Starting from cycle #5 his carboplatin  will be reduced to AUC of 4 and 2 etoposide  80 Mg/M2.  Last dose was giving April 07, 2023 discontinued for suspicious disease progression. 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 Dewey. Last dose on 05/16/24.  11) Topotecan  1.2 mg/M2 on days 1-5 every 3 weeks with Cosela  240 Mg/M2 IV before the chemotherapy.  First dose January 11, 2024. Status post 8 cycles.    CURRENT THERAPY: Palliative systemic chemotherapy with carboplatin  on day 1 and etoposide  on days 1, 2, and 3 IV every 3 weeks.  First dose expected around 08/10/2024.  Dr. Sherrod will likely reduce the dose from cycle #1. He is status post 1 cycle.   INTERVAL HISTORY: Adam Wise 73 y.o. male returns to the clinic today for a follow up visit. The patient was last seen in the clinic on 08/17/24. He has underwent multiple lines of therapy. He is not ready to consider hospice yet. Therefore, he was started back on chemotherapy at a reduced dose.   He has chronic back pain and has recently established with a back specialist at [facility]. He received back injections which have not helped his back pain at this time.  The patient followed up with the back  specialist on 08/24/2024 and they are going to try again next week for repeat injections.  Also has been having some tachycardia.  He had EKGs performed in the emergency room a few weeks ago.  He denies any known arrhythmia when EKG reading reports A-fib and the other 1 reports sinus tachycardia.  He is not on any rate control medications and he has a follow-up appointment with cardiology next week.  He denies any fever or night sweats.  He reports he is cold frequently.  He is anemic.  He denies any abnormal bleeding or bruising.  He is  compliant with his blood thinner.  He drinks Premier protein drinks and propel water.  He denies any changes with his baseline shortness of breath.  He denies any unusual cough, chest pain, or hemoptysis.  He denies any nausea, vomiting, diarrhea, or constipation.  He denies any rashes or skin changes.  He denies any signs of infection including sore throat, nasal congestion, skin infections, or dysuria.  He had a CT abdomen pelvis performed while in the emergency room on 07/17/2024 and a CTA.  He is here today for evaluation and repeat blood work before considering cycle #2.   MEDICAL HISTORY: Past Medical History:  Diagnosis Date   Acute on chronic respiratory failure with hypoxia (HCC) 10/12/2021   Chickenpox    Chronic knee pain    Chronic low back pain    COPD exacerbation (HCC) 10/11/2021   COPD with exacerbation (HCC) 10/12/2021   Coronary artery calcification seen on CAT scan 11/2021   Coronary CTA 11/27/2021: Moderate to large right pleural effusion and compressive atelectasis right lung base. => Coronary Calcium Score 657.  Diffuse RCA calcification.  Minimal mild disease in the LAD and diagonal branches. == Overall limited study.  Notable artifact.   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 He has disease progression after the first line of chemotherapy in December 2021.   Testosterone  deficiency    Type 2 diabetes mellitus (HCC)     ALLERGIES:  is allergic to bupropion and  hydrochlorothiazide.  MEDICATIONS:  Current Outpatient Medications  Medication Sig Dispense Refill   albuterol  (VENTOLIN  HFA) 108 (90 Base) MCG/ACT inhaler Inhale 2 puffs into the lungs every 4 (four) hours as needed for shortness of breath. 18 each 0   B-D 3CC LUER-LOK SYR 22GX1 22G X 1 3 ML MISC USE AS INSTRUCTED FOR TESTOSTERONE  INJECTION EVERY 2 WEEKS 10 each 2   Black Pepper-Turmeric (TURMERIC COMPLEX/BLACK PEPPER PO) Take 1 tablet by mouth in the morning and at bedtime.     blood glucose meter kit and supplies KIT Dispense based on patient and insurance preference. Use up to four times daily as directed. 1 each 0   Calcium Carb-Cholecalciferol (CALCIUM PLUS VITAMIN D3 PO) Take 1 tablet by mouth in the morning and at bedtime.     Coenzyme Q10 (COQ10) 200 MG CAPS Take 200 mg by mouth daily.     Continuous Glucose Sensor (FREESTYLE LIBRE 3 PLUS SENSOR) MISC Use to check blood  sugar continuously. Change sensor every 15 days. 6 each 1   cyclobenzaprine  (FLEXERIL ) 10 MG tablet Take 1 tablet (10 mg total) by mouth 2 (two) times daily as needed for muscle spasms. 20 tablet 0   docusate sodium  (COLACE) 100 MG capsule Take 1 capsule (100 mg total) by mouth every 12 (twelve) hours. 60 capsule 0   ELIQUIS  5 MG TABS tablet TAKE 1 TABLET BY MOUTH TWICE A DAY 180 tablet 2   esomeprazole (NEXIUM) 20 MG capsule Take 20 mg by mouth in the morning and at bedtime.     gabapentin  (NEURONTIN ) 300 MG capsule Take 1 capsule (300 mg total) by mouth 2 (two) times daily. For back pain. 180 capsule 1   glucose blood (ONETOUCH ULTRA) test strip USE UP TO 4 TIMES DAILY AS DIRECTED 400 strip 5   Insulin  Pen Needle (BD PEN NEEDLE NANO U/F) 32G X 4 MM MISC Use with insulin  as prescribed Dx Code: E11.9 100 each 3   ipratropium-albuterol  (DUONEB) 0.5-2.5 (3) MG/3ML SOLN Take 3 mLs by nebulization every 4 (four) hours while awake for 3 days, THEN 3 mLs every 4 (four) hours as needed (shortness of breath or wheezing). 360 mL  0   KRILL OIL PO Take 1 capsule by mouth daily.     Lancets MISC USE UP TO 3 TIMES DAILY AS DIRECTED 100 each 2   lidocaine  (LIDODERM ) 5 % Place 1 patch onto the skin daily. Remove & Discard patch within 12 hours or as directed by MD 30 patch 0   losartan  (COZAAR ) 50 MG tablet TAKE 1 TABLET (50 MG TOTAL) BY MOUTH DAILY. FOR BLOOD PRESSURE. 90 tablet 2   metFORMIN  (GLUCOPHAGE ) 1000 MG tablet TAKE 1 TABLET (1,000 MG TOTAL) BY MOUTH 2 (TWO) TIMES DAILY WITH A MEAL. FOR DIABETES 180 tablet 0   mirtazapine  (REMERON ) 15 MG tablet TAKE 1 TABLET BY MOUTH EVERYDAY AT BEDTIME 90 tablet 1   morphine  (MS CONTIN ) 30 MG 12 hr tablet Take 1 tablet (30 mg total) by mouth every 8 (eight) hours as needed. 90 tablet 0   Multiple Vitamin (MULTI-VITAMINS) TABS Take 1 tablet by mouth daily with breakfast.     Naloxone  HCl 3 MG/0.1ML LIQD Place 1 spray into both nostrils once. For known/suspected opiod overdose. Every 2-3 minutes in alternating nostril till EMS arrives.     ondansetron  (ZOFRAN ) 8 MG tablet Take 1 tablet (8 mg total) by mouth every 8 (eight) hours as needed for nausea or vomiting. 30 tablet 3   oxyCODONE -acetaminophen  (PERCOCET) 10-325 MG tablet Take 1-1.5 tablets by mouth every 4 (four) hours as needed for pain. 90 tablet 0   pioglitazone  (ACTOS ) 45 MG tablet TAKE 1 TABLET (45 MG TOTAL) BY MOUTH DAILY. FOR DIABETES. 90 tablet 1   polyethylene glycol powder (GLYCOLAX /MIRALAX ) 17 GM/SCOOP powder Take 17 g by mouth daily as needed for moderate constipation or mild constipation.     pravastatin  (PRAVACHOL ) 40 MG tablet TAKE 1 TABLET BY MOUTH EVERY DAY IN THE EVENING FOR CHOLESTEROL 90 tablet 2   PRESCRIPTION MEDICATION CPAP- At bedtime     testosterone  cypionate (DEPOTESTOSTERONE CYPIONATE) 200 MG/ML injection Inject 200 mg into the muscle every 14 (fourteen) days.     No current facility-administered medications for this visit.   Facility-Administered Medications Ordered in Other Visits  Medication Dose  Route Frequency Provider Last Rate Last Admin   heparin  lock flush 100 unit/mL  500 Units Intracatheter Once Sherrod Sherrod, MD  sodium chloride  flush (NS) 0.9 % injection 10 mL  10 mL Intracatheter Once Sherrod Sherrod, MD        SURGICAL HISTORY:  Past Surgical History:  Procedure Laterality Date   CHEST TUBE INSERTION Right 01/20/2022   Procedure: CHEST TUBE INSERTION;  Surgeon: Claudene Toribio BROCKS, MD;  Location: [facility] ENDOSCOPY;  Service: Pulmonary;  Laterality: Right;  w/ Talc  Pleurodesis, planned admit for Obs afterwards   CHEST TUBE INSERTION Right 04/28/2022   Procedure: INSERTION PLEURAL DRAINAGE CATHETER - Pigtail tail, drainage;  Surgeon: Claudene Toribio BROCKS, MD;  Location: [facility] ENDOSCOPY;  Service: Pulmonary;  Laterality: Right;  talc  pleurodesis   CHEST TUBE INSERTION Right 09/01/2022   Procedure: INSERTION PLEURAL DRAINAGE CATHETER;  Surgeon: Alaine Vicenta NOVAK, MD;  Location: [facility] ENDOSCOPY;  Service: Cardiopulmonary;  Laterality: Right;  pigtail catheter placement with talc  pleurodesis   COLONOSCOPY WITH PROPOFOL  N/A 12/17/2018   Procedure: COLONOSCOPY WITH PROPOFOL ;  Surgeon: Janalyn Keene NOVAK, MD;  Location: [facility] ENDOSCOPY;  Service: Gastroenterology;  Laterality: N/A;   IR IMAGING GUIDED PORT INSERTION  04/17/2020   IR THORACENTESIS ASP PLEURAL SPACE W/IMG GUIDE  11/29/2021   IR THORACENTESIS ASP PLEURAL SPACE W/IMG GUIDE  01/03/2022   JOINT REPLACEMENT Bilateral    REPLACEMENT TOTAL KNEE BILATERAL  2015   TALC  PLEURODESIS  09/01/2022   Procedure: TALC  PLEURADESIS;  Surgeon: Alaine Vicenta NOVAK, MD;  Location: [facility] ENDOSCOPY;  Service: Cardiopulmonary;;   TONSILLECTOMY  1960   TRANSTHORACIC ECHOCARDIOGRAM  05/2020   a) 05/2020: EF 55 to 60%.  No R WMA.  Mild LVH.  Indeterminate LVEDP.  Unable to assess RVP.  Normal aortic and mitral valves.  Mildly elevated RAP.; b) 09/2021: EF 50-55%. No RWMA. Mild LVH. ~ LVEDP. Mild LA Dilation. NORMAL RV/RAP.  Normal MV/AoV.    REVIEW OF SYSTEMS:    Review of Systems  Constitutional: Positive for fatigue, weight loss, and appetite change.  Negative for chills and fever. HENT: Negative for mouth sores, nosebleeds, sore throat and trouble swallowing.   Eyes: Negative for eye problems and icterus.  Respiratory: Positive for shortness of breath. Negative for cough, hemoptysis, shortness of breath and wheezing.   Cardiovascular: Negative for chest pain and leg swelling.  Gastrointestinal: Negative for abdominal pain, constipation, diarrhea, nausea and vomiting.  Genitourinary: Negative for difficulty urinating, dysuria, and frequency.   Musculoskeletal: Positive for back pain. Negative for gait problem, neck pain and neck stiffness.  Skin: Negative for itching and rash.  Neurological: Positive for lower extremity weakness. Negative for dizziness, gait problem, headaches, light-headedness and seizures.  Hematological: Negative for adenopathy. Does not bruise/bleed easily.  Psychiatric/Behavioral: Negative for confusion, depression and sleep disturbance. The patient is not nervous/anxious.    PHYSICAL EXAMINATION:  There were no vitals taken for this visit.  ECOG PERFORMANCE STATUS: 1  Physical Exam  Constitutional: Oriented to person, place, and time and well-developed, well-nourished, and in no distress.  HENT:  Head: Normocephalic and atraumatic.  Mouth/Throat: Oropharynx is clear and moist. No oropharyngeal exudate.  Eyes: Conjunctivae are normal. Right eye exhibits no discharge. Left eye exhibits no discharge. No scleral icterus.  Neck: Normal range of motion. Neck supple.  Cardiovascular: Tachycardic, regular rhythm, normal heart sounds and intact distal pulses.   Pulmonary/Chest: Effort normal. Quiet breath sounds in the lung bilaterally. No respiratory distress. No rales.  Abdominal: Soft. Bowel sounds are normal. Exhibits no distension and no mass. There is no tenderness.  Musculoskeletal: Normal range of motion. Exhibits no  edema.  Lymphadenopathy:  No cervical adenopathy.  Neurological: Alert and oriented to person, place, and time. Exhibits muscle wasting. Examined in the wheelchair.  Skin: Skin is warm and dry. No rash noted. Not diaphoretic. No erythema. No pallor.  Psychiatric: Mood, memory and judgment normal.  Vitals reviewed.  LABORATORY DATA: Lab Results  Component Value Date   WBC 1.6 (L) 08/22/2024   HGB 8.4 (L) 08/22/2024   HCT 26.4 (L) 08/22/2024   MCV 98.1 08/22/2024   PLT 48 (L) 08/22/2024      Chemistry      Component Value Date/Time   NA 139 08/22/2024 1151   K 4.7 08/22/2024 1151   CL 101 08/22/2024 1151   CO2 30 08/22/2024 1151   BUN 23 08/22/2024 1151   CREATININE 0.80 08/22/2024 1151   CREATININE 1.45 (H) 10/18/2021 1533      Component Value Date/Time   CALCIUM 8.7 (L) 08/22/2024 1151   ALKPHOS 227 (H) 08/22/2024 1151   AST 31 08/22/2024 1151   ALT 36 08/22/2024 1151   BILITOT 0.4 08/22/2024 1151       RADIOGRAPHIC STUDIES:  CT Angio Chest PE W/Cm &/Or Wo Cm Result Date: 08/13/2024 CLINICAL DATA:  Pulmonary embolism (PE) suspected, high prob; gross hematuria, lower abd pain. History of metastatic small cell lung cancer. EXAM: CT ANGIOGRAPHY CHEST CT ABDOMEN AND PELVIS WITH CONTRAST TECHNIQUE: Multidetector CT imaging of the chest was performed using the standard protocol during bolus administration of intravenous contrast. Multiplanar CT image reconstructions and MIPs were obtained to evaluate the vascular anatomy. Multidetector CT imaging of the abdomen and pelvis was performed using the standard protocol during bolus administration of intravenous contrast. RADIATION DOSE REDUCTION: This exam was performed according to the departmental dose-optimization program which includes automated exposure control, adjustment of the mA and/or kV according to patient size and/or use of iterative reconstruction technique. CONTRAST:  OMNIPAQUE  IOHEXOL  350 MG/ML SOLN COMPARISON:   CT angio chest 07/31/2024, CT chest abdomen pelvis 03/08/2024, CT abdomen pelvis 08/02/2023 FINDINGS: CTA CHEST FINDINGS Cardiovascular: Satisfactory opacification of the pulmonary arteries to the segmental level. Nonvisualization of the right upper lobe subsegmental pulmonary arteries that may be due to obstruction or shunting in the setting of right upper quadrant consolidation/mass versus less likely pulmonary embolus. The main pulmonary artery is enlarged in caliber measuring up to 3.8 cm. Normal heart size. No significant pericardial effusion. Ascending thoracic aorta is stable in caliber measuring up to 4 cm. Normal caliber descending thoracic aorta. Moderate atherosclerotic plaque of the thoracic aorta. No coronary artery calcifications. Mediastinum/Nodes: Right hilar soft tissue density extending to the subcarinal region. No distinct enlarged mediastinal, left hilar, or axillary lymph nodes. Thyroid  gland, trachea, and esophagus demonstrate no significant findings. Lungs/Pleura: Similar-appearing consolidative masslike airspace opacity of the right upper and middle lobe. Bronchiectasis of the right middle lobe again noted. Patchy ground-glass airspace opacities of the right lower lobe again noted. No pulmonary mass. No pleural effusion. No pneumothorax. Musculoskeletal: No chest wall abnormality. No suspicious lytic or blastic osseous lesions. No acute displaced fracture. Multilevel degenerative changes of the spine. Review of the MIP images confirms the above findings. CT ABDOMEN and PELVIS FINDINGS Hepatobiliary: Similar-appearing 6 x 5 cm hypodense right posterior hepatic lobe lesion. Persistent 1.4 cm left medial hepatic lobe mass. Status post cholecystectomy. No biliary dilatation. Pancreas: Diffusely atrophic. No focal lesion. Otherwise normal pancreatic contour. No surrounding inflammatory changes. No main pancreatic ductal dilatation. Spleen: Normal in size without focal abnormality. Adrenals/Urinary  Tract: No adrenal nodule bilaterally.  Bilateral kidneys enhance symmetrically. Fluid density lesions of the right kidney likely represent simple renal cysts. Simple renal cysts, in the absence of clinically indicated signs/symptoms, require no independent follow-up. No hydronephrosis. No hydroureter. The urinary bladder is decompressed with associated urinary bladder wall thickening. Stomach/Bowel: Stomach is within normal limits. No evidence of bowel wall thickening or dilatation. Appendix appears normal. Vascular/Lymphatic: No abdominal aorta or iliac aneurysm. Severe atherosclerotic plaque of the aorta and its branches. No abdominal, pelvic, or inguinal lymphadenopathy. Reproductive: Prominent prostate measures up to 4.6 cm. Other: No intraperitoneal free fluid. No intraperitoneal free gas. No organized fluid collection. Musculoskeletal: No abdominal wall hernia or abnormality. No suspicious lytic or blastic osseous lesions. No acute displaced fracture. Bilateral L5 pars interarticularis defects. Review of the MIP images confirms the above findings. IMPRESSION: 1. Simila nonvisualization of the right upper lobe subsegmental pulmonary arteries that may be due to obstruction or shunting in the setting of right upper quadrant consolidation/mass versus less likely pulmonary embolus. 2. Similar-appearing consolidative masslike airspace opacity of the right upper and middle lobe. Patchy ground-glass airspace opacities of the right lower lobe again noted. Associated right hilar soft tissue density extending to the subcarinal region. Finding concerning for recurrent malignancy versus metastatic disease. 3. Similar-appearing 2 hepatic lobe lesions consistent with known metastasis. 4. Enlarged main pulmonary artery suggestive of pulmonary hypertension. 5. Decompressed urinary bladder with circumferential urinary bladder wall thickening. Finding may be due to under can not compression. Consider correlating with urinalysis  for infection. 6. Bilateral L5 pars interarticularis defects. 7. Aneurysmal ascending thoracic aorta (4 cm). Recommend annual imaging followup by CTA or MRA. This recommendation follows 2010 ACCF/AHA/AATS/ACR/ASA/SCA/SCAI/SIR/STS/SVM Guidelines for the Diagnosis and Management of Patients with Thoracic Aortic Disease. Circulation. 2010; 121: [id number]. Aortic aneurysm NOS (ICD10-I71.9). 8.  Aortic Atherosclerosis (ICD10-I70.0). Electronically Signed   By: Morgane  Naveau M.D.   On: 08/13/2024 19:18   CT ABDOMEN PELVIS W WO CONTRAST Result Date: 08/13/2024 CLINICAL DATA:  Pulmonary embolism (PE) suspected, high prob; gross hematuria, lower abd pain. History of metastatic small cell lung cancer. EXAM: CT ANGIOGRAPHY CHEST CT ABDOMEN AND PELVIS WITH CONTRAST TECHNIQUE: Multidetector CT imaging of the chest was performed using the standard protocol during bolus administration of intravenous contrast. Multiplanar CT image reconstructions and MIPs were obtained to evaluate the vascular anatomy. Multidetector CT imaging of the abdomen and pelvis was performed using the standard protocol during bolus administration of intravenous contrast. RADIATION DOSE REDUCTION: This exam was performed according to the departmental dose-optimization program which includes automated exposure control, adjustment of the mA and/or kV according to patient size and/or use of iterative reconstruction technique. CONTRAST:  OMNIPAQUE  IOHEXOL  350 MG/ML SOLN COMPARISON:  CT angio chest 07/31/2024, CT chest abdomen pelvis 03/08/2024, CT abdomen pelvis 08/02/2023 FINDINGS: CTA CHEST FINDINGS Cardiovascular: Satisfactory opacification of the pulmonary arteries to the segmental level. Nonvisualization of the right upper lobe subsegmental pulmonary arteries that may be due to obstruction or shunting in the setting of right upper quadrant consolidation/mass versus less likely pulmonary embolus. The main pulmonary artery is enlarged in caliber  measuring up to 3.8 cm. Normal heart size. No significant pericardial effusion. Ascending thoracic aorta is stable in caliber measuring up to 4 cm. Normal caliber descending thoracic aorta. Moderate atherosclerotic plaque of the thoracic aorta. No coronary artery calcifications. Mediastinum/Nodes: Right hilar soft tissue density extending to the subcarinal region. No distinct enlarged mediastinal, left hilar, or axillary lymph nodes. Thyroid  gland, trachea, and esophagus demonstrate no significant findings. Lungs/Pleura: Similar-appearing  consolidative masslike airspace opacity of the right upper and middle lobe. Bronchiectasis of the right middle lobe again noted. Patchy ground-glass airspace opacities of the right lower lobe again noted. No pulmonary mass. No pleural effusion. No pneumothorax. Musculoskeletal: No chest wall abnormality. No suspicious lytic or blastic osseous lesions. No acute displaced fracture. Multilevel degenerative changes of the spine. Review of the MIP images confirms the above findings. CT ABDOMEN and PELVIS FINDINGS Hepatobiliary: Similar-appearing 6 x 5 cm hypodense right posterior hepatic lobe lesion. Persistent 1.4 cm left medial hepatic lobe mass. Status post cholecystectomy. No biliary dilatation. Pancreas: Diffusely atrophic. No focal lesion. Otherwise normal pancreatic contour. No surrounding inflammatory changes. No main pancreatic ductal dilatation. Spleen: Normal in size without focal abnormality. Adrenals/Urinary Tract: No adrenal nodule bilaterally. Bilateral kidneys enhance symmetrically. Fluid density lesions of the right kidney likely represent simple renal cysts. Simple renal cysts, in the absence of clinically indicated signs/symptoms, require no independent follow-up. No hydronephrosis. No hydroureter. The urinary bladder is decompressed with associated urinary bladder wall thickening. Stomach/Bowel: Stomach is within normal limits. No evidence of bowel wall thickening or  dilatation. Appendix appears normal. Vascular/Lymphatic: No abdominal aorta or iliac aneurysm. Severe atherosclerotic plaque of the aorta and its branches. No abdominal, pelvic, or inguinal lymphadenopathy. Reproductive: Prominent prostate measures up to 4.6 cm. Other: No intraperitoneal free fluid. No intraperitoneal free gas. No organized fluid collection. Musculoskeletal: No abdominal wall hernia or abnormality. No suspicious lytic or blastic osseous lesions. No acute displaced fracture. Bilateral L5 pars interarticularis defects. Review of the MIP images confirms the above findings. IMPRESSION: 1. Simila nonvisualization of the right upper lobe subsegmental pulmonary arteries that may be due to obstruction or shunting in the setting of right upper quadrant consolidation/mass versus less likely pulmonary embolus. 2. Similar-appearing consolidative masslike airspace opacity of the right upper and middle lobe. Patchy ground-glass airspace opacities of the right lower lobe again noted. Associated right hilar soft tissue density extending to the subcarinal region. Finding concerning for recurrent malignancy versus metastatic disease. 3. Similar-appearing 2 hepatic lobe lesions consistent with known metastasis. 4. Enlarged main pulmonary artery suggestive of pulmonary hypertension. 5. Decompressed urinary bladder with circumferential urinary bladder wall thickening. Finding may be due to under can not compression. Consider correlating with urinalysis for infection. 6. Bilateral L5 pars interarticularis defects. 7. Aneurysmal ascending thoracic aorta (4 cm). Recommend annual imaging followup by CTA or MRA. This recommendation follows 2010 ACCF/AHA/AATS/ACR/ASA/SCA/SCAI/SIR/STS/SVM Guidelines for the Diagnosis and Management of Patients with Thoracic Aortic Disease. Circulation. 2010; 121: [id number]. Aortic aneurysm NOS (ICD10-I71.9). 8.  Aortic Atherosclerosis (ICD10-I70.0). Electronically Signed   By: Morgane  Naveau  M.D.   On: 08/13/2024 19:18   MR Lumbar Spine W Wo Contrast Result Date: 08/13/2024 EXAM: MR Lumbar Spine With and Without Intravenous Contrast. 08/13/2024 06:19:21 PM TECHNIQUE: Multiplanar multisequence MRI of the lumbar spine was performed with and without the administration of intravenous contrast. COMPARISON: CT of the abdomen and pelvis without and with contrast 08/13/2024. CT of the lumbar spine without contrast 04/26/2024. MRI of the lumbar spine without contrast 02/21/2019. CLINICAL HISTORY: Low back pain, cauda equina syndrome suspected. Lung cancer. FINDINGS: BONES AND ALIGNMENT: Slight degenerative retrolisthesis at T12-L1 and L4-5 is stable. 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 SPINAL CORD: Conus medullaris terminates at L1. SOFT TISSUES: Simple cysts at the lower pole of the right kidney are incompletely imaged. These were seen on the CT scan of the same day. L1-L2: Dysplasia and facet hypertrophy is present  without significant stenosis. L2-L3: A broad-based disc protrusion is present. Moderate facet hypertrophy is noted bilaterally. Mild central and bilateral foraminal stenosis has progressed. L3-L4: A broad-based disc protrusion is present. Moderate facet hypertrophy has advanced. Severe central and right foraminal stenosis has progressed. Moderate left foraminal stenosis has progressed. L4-L5: Broad-based disc protrusion is present. Moderate facet hypertrophy is noted bilaterally. Chronic loss of disc height present. L5-S1: No disc herniation. No spinal canal stenosis or neural foraminal narrowing. IMPRESSION: 1. Progressed severe central and right foraminal stenosis and moderate left foraminal stenosis at L3-L4. 2. Progressed mild central and bilateral foraminal stenosis at L2-L3. 3. Mild central and moderate bilateral foraminal stenosis at L4-5 is stable. Electronically signed by: Lonni Necessary MD 08/13/2024 06:45 PM EDT RP Workstation: [id number]   DG Chest  2 View Result Date: 08/13/2024 CLINICAL DATA:  Chest pain and shortness of breath. EXAM: DG CHEST 2V COMPARISON:  Chest x-ray 07/31/2024.  Chest CT 07/31/2024. FINDINGS: Right chest port catheter tip ends in the distal SVC, unchanged. Volume loss throughout the right hemithorax with pleural thickening persists. Right upper lobe and perihilar mass is unchanged. There is a new band of atelectasis in the left lung base. The left lung is otherwise clear. No pneumothorax. No pleural effusion. No acute fractures are seen. IMPRESSION: 1. New band of atelectasis in the left lung base. 2. Stable right upper lobe and perihilar mass. 3. Stable volume loss throughout the right hemithorax with pleural thickening. Electronically Signed   By: Greig Pique M.D.   On: 08/13/2024 17:13   CT Angio Chest/Abd/Pel for Dissection W and/or Wo Contrast Result Date: 07/31/2024 CLINICAL DATA:  Chest pain back pain lung cancer EXAM: CT ANGIOGRAPHY CHEST, ABDOMEN AND PELVIS TECHNIQUE: Non-contrast CT of the chest was initially obtained. Multidetector CT imaging through the chest, abdomen and pelvis was performed using the standard protocol during bolus administration of intravenous contrast. Multiplanar reconstructed images and MIPs were obtained and reviewed to evaluate the vascular anatomy. RADIATION DOSE REDUCTION: This exam was performed according to the departmental dose-optimization program which includes automated exposure control, adjustment of the mA and/or kV according to patient size and/or use of iterative reconstruction technique. CONTRAST:  OMNIPAQUE  IOHEXOL  350 MG/ML SOLN COMPARISON:  Chest x-ray 07/31/2024, CT 06/09/2024, 03/08/2024, 05/19/2023, 05/01/2023, multiple exams dating back to 03/26/2020 FINDINGS: CTA CHEST FINDINGS Cardiovascular: Non contrasted images of the chest demonstrate no acute intramural hematoma. Mild aortic atherosclerosis. No dissection is seen. Ectatic ascending aorta up to 3.8 cm. Coronary  vascular calcification. Cardiomegaly. Trace pericardial effusion. Right-sided central venous port with tip in the right atrium. Mediastinum/Nodes: Patent trachea. No thyroid  mass. Progressive soft tissue density within the mediastinum, most evident in the subcarinal region, series 11 image 56 and surrounding the right bronchus, series 11 image 50 through 58. Increased right hilar soft tissue density compared to prior. Esophagus within normal limits. Lungs/Pleura: Volume loss right hemithorax as before. Suspect slight increased pleural thickening compared to prior. Right Peri hilar distortion and bronchiectasis corresponding to post radiation change but progressive confluent and more masslike consolidation with decreased air bronchograms compared to prior and raising concern for tumor recurrence. Overall consolidative process is increased compared to the prior CT exams. Underlying mild fibrotic change at the right middle lobe but with interval increase in interstitial and ground-glass density. New irregular focus of airspace disease and ground-glass density in the anterior right base, series 7, image 93 measuring 10 mm. This is contiguous with irregular airspace disease in the anterior right lung  base, series 7, image 97, with surrounding ground-glass. Within the posterior, medial right base, there is new heterogeneous ground-glass density and multiple small irregular nodular densities, for example 9 mm density on series 7, image 108 and adjacent 6 mm densities on series 7, image 108 and 107. Chronic mild hyperdense material in the right posterior pleural space. Musculoskeletal: Sternum appears intact. Multilevel degenerative changes. No definite acute osseous abnormality is seen. Review of the MIP images confirms the above findings. CTA ABDOMEN AND PELVIS FINDINGS VASCULAR Aorta: Normal caliber aorta without aneurysm, dissection, vasculitis or significant stenosis. Moderate aortic atherosclerosis. Celiac: Patent  without evidence of aneurysm, dissection, vasculitis or significant stenosis. SMA: Patent without evidence of aneurysm, dissection, vasculitis or significant stenosis. Renals: Both renal arteries are patent without evidence of aneurysm, dissection, vasculitis, fibromuscular dysplasia or significant stenosis. IMA: Patent without evidence of aneurysm, dissection, vasculitis or significant stenosis. Inflow: Patent without evidence of aneurysm, dissection, vasculitis or significant stenosis. Veins: Suboptimally assessed Review of the MIP images confirms the above findings. NON-VASCULAR Hepatobiliary: Nodular hepatic contour suggests possible cirrhosis change. Hepatic metastatic disease. Right hepatic dome lesion measures about 6.8 x 4.6 cm, previously 5.8 x 4.7 cm but was better seen on venous phase imaging. Vague caudate lesion measures 2.1 cm on series 11, image 123, 17 mm on the prior exam. No calcified gallstone or biliary dilatation. Fiducial markers in the inferior right hepatic lobe, adjacent to hypodense metastatic focus, this lesion measures 19 mm on series 11, image 123, previously 14 mm. Pancreas: Atrophic.  No acute inflammation Spleen: Heterogenous enhancement likely due to arterial phase. Adrenals/Urinary Tract: Stable negative left adrenal gland. Stable 15 mm right adrenal nodule. The kidneys show no hydronephrosis. Right renal cysts for which no imaging follow-up is recommended. The bladder is unremarkable. Stomach/Bowel: The stomach is decompressed but suspect mild diffuse gastric wall thickening. There is no dilated small bowel. No acute bowel wall thickening. Lymphatic: Multiple enlarged peripancreatic and porta hepatis nodes. Node adjacent to the proximal pancreas measures 31 mm on series 11, image 30, previously 21 mm. Node adjacent to the inferior pancreatic head measures 36 mm on series 11, image 146, previously 26 mm. Reproductive: Negative prostate Other: Negative for pelvic effusion or free air  Musculoskeletal: No acute osseous abnormality. No suspicious bone lesion. Advanced disc space narrowing at L4-L5 with moderate severe canal stenosis due to combination of disc disease and posterior ligamentum flavum and facet disease. Disc space narrowing at L3-L4 with gas locules in the right foramen. Severe canal stenosis suspected likely due to disc disease and facet hypertrophy. 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 Review of the MIP images confirms the above findings. IMPRESSION: 1. Negative for acute aortic dissection or aneurysm. 2. Post therapeutic changes in the right hilar/suprahilar lung but with interim progressive masslike consolidative process in the right perihilar region with decreased air bronchograms compared to prior, raising concern for tumor recurrence. Progressive soft tissue density within the mediastinum concerning for tumor recurrence/metastatic disease. 3. New irregular foci of airspace disease/nodularity and ground-glass density in the right middle lobe and right lung base as described above, indeterminate for tumor versus infectious or inflammatory process. 4. Hepatic metastatic disease with slight interval increase in size of hepatic lesions 5. Increase in size of peripancreatic and porta hepatis lymph nodes consistent with disease progression. 6. Advanced degenerative changes of the lumbar spine with suspected moderate to severe canal stenosis at L3-L4 and L4-L5. These findings would be better assessed with MRI Aortic Atherosclerosis (ICD10-I70.0). Electronically Signed  By: Luke Bun M.D.   On: 07/31/2024 18:48   DG Chest Portable 1 View Result Date: 07/31/2024 CLINICAL DATA:  Chest pain EXAM: PORTABLE CHEST 1 VIEW COMPARISON:  01/04/2024, CT 06/09/2024, radiograph 08/10/2023, 09/18/2022 FINDINGS: Right-sided central venous port with tip at the cavoatrial region. Volume loss right thorax with distortion and masslike consolidation in the right hilar/suprahilar lung appears slightly more  confluent compared to the chest x-ray from January. Pleural thickening and or fluid not significantly changed. Stable enlarged cardiomediastinal silhouette. No pneumothorax IMPRESSION: Volume loss right thorax with distortion and masslike consolidation in the right hilar/suprahilar lung appears slightly more confluent compared to the chest x-ray from January but probably stable compared with scout image from CT in June and likely related to post treatment changes. Contrast-enhanced chest CT follow-up if indicated. Similar right pleural fluid and or thickening. Cardiomegaly Electronically Signed   By: Luke Bun M.D.   On: 07/31/2024 17:30     ASSESSMENT/PLAN:  This is a very pleasant 73 year old Caucasian male diagnosed with extensive stage (T4, N3, M1C) small cell lung cancer presented with large right upper lobe lung mass in addition to mediastinal and right supraclavicular lymphadenopathy and multiple metastatic liver lesions diagnosed in April 2021.    The patient initially underwent systemic chemotherapy with carboplatin  for an AUC of 5 on day 1, etoposide  100 mg per metered squared on days 1, 2, and 3 in addition to Cosela  for myeloprotection.  He also received immunotherapy with Imfinzi  on day 1 of every chemotherapy cycle.  He is status post 8 cycles.  Starting from cycle #5 he was on single agent immunotherapy with Imfinzi  IV every 4 weeks   The patient had evidence of disease progression on his scan from December 2021.   He was then started on systemic chemotherapy with carboplatin  for an AUC of 5 on day 1, etoposide  100 mg per metered squared on days 1, 2, and 3 in addition to Tecentriq  1200 mg on day 1, the patient is status post 8 cycles.  Starting from cycle #5, the patient started maintenance immunotherapy with Tecentriq .  This was discontinued due to evidence of disease progression.   The patient then underwent Zepzelca  IV every 3 weeks.  He is status post his 12 cycles and he  tolerated it well except for fatigue a few days after treatment.  This was discontinued in February 2023 due to evidence of disease progression.    The patient then underwent treatment with irinotecan  65 mg per metered squared on days 1 and 8 IV every 3 weeks.  The patient is status post 3 cycles and tolerated it fairly well without any concerning adverse side effects.  This was discontinued secondary to disease progression.   Dr. Sherrod saw the patient after this to discuss the options including palliative care and hospice versus single agent Gemzar, or Taxol.  He also discussed repeating his initial treatment with carboplatin  and etoposide  since he had a good response to treatment in the past.   He was referred to Dr. Boyd at [facility]  for consideration of enrollment in the clinical trial with tarlatamab.    He also saw Dr. Lysbeth at [facility] for consideration of enrollment in the [place] [id number] trial.  The patient was not interested due to the travel burden.   The patient was interested in restarting systemic chemotherapy with carboplatin  for an AUC of 5 on day 1 and etoposide  100 mg per metered squared on days 1, 2,  and 3 with Cosela . He is status post 14 cycles and tolerated it well. His dose of carboplatin  was reduced to an AUC of 4 and etoposide  80 mg per metered squared.     He underwent SBRT to a liver lesion  which was completed on 01/27/23.    The patient was seen by Dr. Boyd at [facility] and treated with tarlatamab which is a by specific T-cell engager (BiTE) for 8 months. Unfortunately the repeat CT scan of the chest, abdomen and pelvis in December 2024 showed interval mild to moderate increase in the size of the segment 7 liver lesion in addition to interval mild increase in the size of portacaval and common hepatic artery lymph nodes with stable size retroperitoneal lymph nodes and stable size right adrenal nodule. There was no evidence for disease progression in the chest.  There was focal enhancement in the right iliac bone concerning for metastatic disease.    He completed palliative radiation to the right iliac bone lesion in February 2025.   Dr. Sherrod recommended Topotecan  regimen: 5 days in a row every 3 weeks.  He tolerates it fairly well except he did have some cytopenias.  He is status post 8 cycles. His dose was reduced starting with cycle #2.     He had some progressive disease in the upper abdominal lymph nodes/liver.    He underwent SBRT and completed this on 05/16/2024.   The patient was recently admitted to the hospital for intractable back pain.  He had a CT scan that showed disease progression.     Dr. Sherrod discussed that we are running out of options.  Dr. Sherrod discussed oral treatment with Temodar which may not be effective or try to start him back on carboplatin  and etoposide  at a reduced dose.  The patient opted to try carboplatin  and etoposide  at a reduced dose.    He started this and is status post 1 cycle.   Today (08/30/24), The patient was seen with Dr. Sherrod today. Labs were reviewed. His Hbg is 8.2, Plts 248, and WBC 2.6.   His pulse is 133. He denies arrhythmia. EKG was obtained which shows pulse 124 sinus tachycardia. We may give him 1 unit of blood given his tachycardia and symptomatic anemia with fatigue.  He was given strict instructions on what would warrant ER evaluation if he had persistent tachycardia at home with associated symptoms such as shortness of breath, lightheadedness, syncope, palpitations, and persistent elevated pulse.  He is scheduled to see cardiology until next week.   We will continue to monitor his labs closely on a weekly basis.  I have sanding orders in for sample of blood bank.  He is scheduled to see palliative care today.   I have standing orders for sample to blood bank. We will give him a blood transfusion if his Hbg is <8  He is scheduled to see the spine specialist next week.   We will  see him with cycle #3.   He had CTA and CT AP on 8/30 in the ER. Therefore, we will arrange for repeat CT after cycle #3.   The patient was advised to call immediately if he has any concerning symptoms in the interval. The patient voices understanding of current disease status and treatment options and is in agreement with the current care plan. All questions were answered. The patient knows to call the clinic with any problems, questions or concerns. We can certainly see the patient much sooner  if necessary   No orders of the defined types were placed in this encounter.     Julaine Zimny L Punam Broussard, PA-C 08/28/24  ADDENDUM: Hematology/Oncology Attending: I had a face-to-face encounter with the patient today.  I reviewed his record, lab and recommended his care plan.  This is a very pleasant 73 years old white male with extensive stage small cell lung cancer that was initially diagnosed in December 2021 status post several treatment in the past and currently on repeat systemic chemotherapy with reduced dose carboplatin  and Doutova side status post 1 cycle.  He tolerated the first cycle of his treatment fairly well except for the increasing fatigue and weakness as well as lack of appetite and hypotension.  He also has chemotherapy-induced anemia requiring PRBCs transfusion.  He was tachycardic today but repeat EKG showed sinus tachycardia. I recommended for the patient to proceed with cycle #2 today but we will arrange for him to receive 1 unit of PRBCs transfusion in addition to IV hydration with normal saline. Will continue to monitor his blood count closely in the upcoming weeks. He will have repeat CT scan of the chest, abdomen and pelvis performed in 2 weeks before starting cycle #3. The patient was advised to call immediately if he has any other concerning symptoms in the interval. The total time spent in the appointment was 30 minutes including review of chart and various tests results,  discussions about plan of care and coordination of care plan . Disclaimer: This note was dictated with voice recognition software. Similar sounding words can inadvertently be transcribed and may be missed upon review. Sherrod MARLA Sherrod, MD

## 2024-08-29 ENCOUNTER — Ambulatory Visit: Payer: PRIVATE HEALTH INSURANCE | Admitting: Physician Assistant

## 2024-08-29 ENCOUNTER — Other Ambulatory Visit: Payer: PRIVATE HEALTH INSURANCE

## 2024-08-29 ENCOUNTER — Inpatient Hospital Stay

## 2024-08-29 ENCOUNTER — Ambulatory Visit: Payer: PRIVATE HEALTH INSURANCE

## 2024-08-30 ENCOUNTER — Inpatient Hospital Stay (HOSPITAL_BASED_OUTPATIENT_CLINIC_OR_DEPARTMENT_OTHER): Payer: PRIVATE HEALTH INSURANCE | Admitting: Physician Assistant

## 2024-08-30 ENCOUNTER — Encounter: Payer: Self-pay | Admitting: Nurse Practitioner

## 2024-08-30 ENCOUNTER — Inpatient Hospital Stay: Admitting: Nurse Practitioner

## 2024-08-30 ENCOUNTER — Other Ambulatory Visit: Payer: PRIVATE HEALTH INSURANCE

## 2024-08-30 ENCOUNTER — Ambulatory Visit: Payer: PRIVATE HEALTH INSURANCE

## 2024-08-30 ENCOUNTER — Inpatient Hospital Stay: Payer: PRIVATE HEALTH INSURANCE

## 2024-08-30 ENCOUNTER — Other Ambulatory Visit: Payer: Self-pay

## 2024-08-30 ENCOUNTER — Ambulatory Visit: Payer: PRIVATE HEALTH INSURANCE | Admitting: Physician Assistant

## 2024-08-30 VITALS — BP 98/75 | HR 133 | Temp 96.5°F | Resp 16 | Wt 229.2 lb

## 2024-08-30 VITALS — BP 102/60 | HR 90 | Temp 97.7°F | Resp 17

## 2024-08-30 DIAGNOSIS — R000 Tachycardia, unspecified: Secondary | ICD-10-CM | POA: Diagnosis not present

## 2024-08-30 DIAGNOSIS — C3411 Malignant neoplasm of upper lobe, right bronchus or lung: Secondary | ICD-10-CM

## 2024-08-30 DIAGNOSIS — Z5111 Encounter for antineoplastic chemotherapy: Secondary | ICD-10-CM

## 2024-08-30 DIAGNOSIS — C7951 Secondary malignant neoplasm of bone: Secondary | ICD-10-CM | POA: Diagnosis not present

## 2024-08-30 DIAGNOSIS — D649 Anemia, unspecified: Secondary | ICD-10-CM

## 2024-08-30 DIAGNOSIS — Z515 Encounter for palliative care: Secondary | ICD-10-CM | POA: Diagnosis not present

## 2024-08-30 DIAGNOSIS — D6481 Anemia due to antineoplastic chemotherapy: Secondary | ICD-10-CM

## 2024-08-30 DIAGNOSIS — D696 Thrombocytopenia, unspecified: Secondary | ICD-10-CM

## 2024-08-30 DIAGNOSIS — R530 Neoplastic (malignant) related fatigue: Secondary | ICD-10-CM

## 2024-08-30 DIAGNOSIS — G893 Neoplasm related pain (acute) (chronic): Secondary | ICD-10-CM

## 2024-08-30 DIAGNOSIS — K5903 Drug induced constipation: Secondary | ICD-10-CM

## 2024-08-30 DIAGNOSIS — C787 Secondary malignant neoplasm of liver and intrahepatic bile duct: Secondary | ICD-10-CM | POA: Diagnosis not present

## 2024-08-30 LAB — CBC WITH DIFFERENTIAL (CANCER CENTER ONLY)
Abs Immature Granulocytes: 0.02 K/uL (ref 0.00–0.07)
Basophils Absolute: 0 K/uL (ref 0.0–0.1)
Basophils Relative: 1 %
Eosinophils Absolute: 0 K/uL (ref 0.0–0.5)
Eosinophils Relative: 1 %
HCT: 25.8 % — ABNORMAL LOW (ref 39.0–52.0)
Hemoglobin: 8.2 g/dL — ABNORMAL LOW (ref 13.0–17.0)
Immature Granulocytes: 1 %
Lymphocytes Relative: 12 %
Lymphs Abs: 0.3 K/uL — ABNORMAL LOW (ref 0.7–4.0)
MCH: 31.1 pg (ref 26.0–34.0)
MCHC: 31.8 g/dL (ref 30.0–36.0)
MCV: 97.7 fL (ref 80.0–100.0)
Monocytes Absolute: 0.6 K/uL (ref 0.1–1.0)
Monocytes Relative: 22 %
Neutro Abs: 1.7 K/uL (ref 1.7–7.7)
Neutrophils Relative %: 63 %
Platelet Count: 248 K/uL (ref 150–400)
RBC: 2.64 MIL/uL — ABNORMAL LOW (ref 4.22–5.81)
RDW: 17.9 % — ABNORMAL HIGH (ref 11.5–15.5)
WBC Count: 2.6 K/uL — ABNORMAL LOW (ref 4.0–10.5)
nRBC: 0 % (ref 0.0–0.2)

## 2024-08-30 LAB — CMP (CANCER CENTER ONLY)
ALT: 18 U/L (ref 0–44)
AST: 21 U/L (ref 15–41)
Albumin: 3.2 g/dL — ABNORMAL LOW (ref 3.5–5.0)
Alkaline Phosphatase: 173 U/L — ABNORMAL HIGH (ref 38–126)
Anion gap: 7 (ref 5–15)
BUN: 21 mg/dL (ref 8–23)
CO2: 29 mmol/L (ref 22–32)
Calcium: 8.6 mg/dL — ABNORMAL LOW (ref 8.9–10.3)
Chloride: 100 mmol/L (ref 98–111)
Creatinine: 0.79 mg/dL (ref 0.61–1.24)
GFR, Estimated: >60 mL/min (ref >60)
Glucose, Bld: 164 mg/dL — ABNORMAL HIGH (ref 70–99)
Potassium: 4 mmol/L (ref 3.5–5.1)
Sodium: 136 mmol/L (ref 135–145)
Total Bilirubin: 0.5 mg/dL (ref 0.0–1.2)
Total Protein: 5.9 g/dL — ABNORMAL LOW (ref 6.5–8.1)

## 2024-08-30 LAB — SAMPLE TO BLOOD BANK

## 2024-08-30 LAB — PREPARE RBC (CROSSMATCH)

## 2024-08-30 MED ORDER — TRILACICLIB DIHYDROCHLORIDE INJECTION 300 MG
240.0000 mg/m2 | Freq: Once | INTRAVENOUS | Status: AC
  Administered 2024-08-30: 600 mg via INTRAVENOUS
  Filled 2024-08-30: qty 40

## 2024-08-30 MED ORDER — APREPITANT 130 MG/18ML IV EMUL
130.0000 mg | Freq: Once | INTRAVENOUS | Status: AC
  Administered 2024-08-30: 130 mg via INTRAVENOUS
  Filled 2024-08-30: qty 18

## 2024-08-30 MED ORDER — SODIUM CHLORIDE 0.9% IV SOLUTION
250.0000 mL | Freq: Once | INTRAVENOUS | Status: AC
  Administered 2024-08-30: 100 mL via INTRAVENOUS

## 2024-08-30 MED ORDER — DEXAMETHASONE SODIUM PHOSPHATE 10 MG/ML IJ SOLN
10.0000 mg | Freq: Once | INTRAMUSCULAR | Status: AC
  Administered 2024-08-30: 10 mg via INTRAVENOUS
  Filled 2024-08-30: qty 1

## 2024-08-30 MED ORDER — DIPHENHYDRAMINE HCL 50 MG/ML IJ SOLN
25.0000 mg | Freq: Once | INTRAMUSCULAR | Status: AC
  Administered 2024-08-30: 25 mg via INTRAVENOUS
  Filled 2024-08-30: qty 1

## 2024-08-30 MED ORDER — SODIUM CHLORIDE 0.9 % IV SOLN
80.0000 mg/m2 | Freq: Once | INTRAVENOUS | Status: AC
  Administered 2024-08-30: 200 mg via INTRAVENOUS
  Filled 2024-08-30: qty 10

## 2024-08-30 MED ORDER — SODIUM CHLORIDE 0.9 % IV SOLN: INTRAVENOUS | Status: DC

## 2024-08-30 MED ORDER — FAMOTIDINE IN NACL 20-0.9 MG/50ML-% IV SOLN
20.0000 mg | Freq: Once | INTRAVENOUS | Status: AC
  Administered 2024-08-30: 20 mg via INTRAVENOUS
  Filled 2024-08-30: qty 50

## 2024-08-30 MED ORDER — SODIUM CHLORIDE 0.9 % IV SOLN
491.6000 mg | Freq: Once | INTRAVENOUS | Status: AC
  Administered 2024-08-30: 490 mg via INTRAVENOUS
  Filled 2024-08-30: qty 49

## 2024-08-30 MED ORDER — ACETAMINOPHEN 325 MG PO TABS
650.0000 mg | ORAL_TABLET | Freq: Once | ORAL | Status: AC
  Administered 2024-08-30: 650 mg via ORAL
  Filled 2024-08-30: qty 2

## 2024-08-30 MED ORDER — PALONOSETRON HCL INJECTION 0.25 MG/5ML
0.2500 mg | Freq: Once | INTRAVENOUS | Status: AC
  Administered 2024-08-30: 0.25 mg via INTRAVENOUS
  Filled 2024-08-30: qty 5

## 2024-08-30 NOTE — Patient Instructions (Signed)
 [facility] - A DEPT OF [facility]  Discharge Instructions: Thank you for choosing [facility] to provide your oncology and hematology care.   If you have a lab appointment with the Cancer Center, please go directly to the Cancer Center and check in at the registration area.   Wear comfortable clothing and clothing appropriate for easy access to any Portacath or PICC line.   We strive to give you quality time with your provider. You may need to reschedule your appointment if you arrive late (15 or more minutes).  Arriving late affects you and other patients whose appointments are after yours.  Also, if you miss three or more appointments without notifying the office, you may be dismissed from the clinic at the provider's discretion.      For prescription refill requests, have your pharmacy contact our office and allow 72 hours for refills to be completed.    Today you received the following chemotherapy and/or immunotherapy agents :  Etoposide , Cosela , Carboplatin , 1 unit PRBC    To help prevent nausea and vomiting after your treatment, we encourage you to take your nausea medication as directed.  BELOW ARE SYMPTOMS THAT SHOULD BE REPORTED IMMEDIATELY: *FEVER GREATER THAN 100.4 F (38 C) OR HIGHER *CHILLS OR SWEATING *NAUSEA AND VOMITING THAT IS NOT CONTROLLED WITH YOUR NAUSEA MEDICATION *UNUSUAL SHORTNESS OF BREATH *UNUSUAL BRUISING OR BLEEDING *URINARY PROBLEMS (pain or burning when urinating, or frequent urination) *BOWEL PROBLEMS (unusual diarrhea, constipation, pain near the anus) TENDERNESS IN MOUTH AND THROAT WITH OR WITHOUT PRESENCE OF ULCERS (sore throat, sores in mouth, or a toothache) UNUSUAL RASH, SWELLING OR PAIN  UNUSUAL VAGINAL DISCHARGE OR ITCHING   Items with * indicate a potential emergency and should be followed up as soon as possible or go to the Emergency Department if any problems should occur.  Please show the  CHEMOTHERAPY ALERT CARD or IMMUNOTHERAPY ALERT CARD at check-in to the Emergency Department and triage nurse.  Should you have questions after your visit or need to cancel or reschedule your appointment, please contact [facility] - A DEPT OF [facility]  Dept: [phone]  and follow the prompts.  Office hours are 8:00 a.m. to 4:30 p.m. Monday - Friday. Please note that voicemails left after 4:00 p.m. may not be returned until the following business day.  We are closed weekends and major holidays. You have access to a nurse at all times for urgent questions. Please call the main number to the clinic Dept: [phone] and follow the prompts.   For any non-urgent questions, you may also contact your provider using MyChart. We now offer e-Visits for anyone 6 and older to request care online for non-urgent symptoms. For details visit [email or web address].   Also download the MyChart app! Go to the app store, search MyChart, open the app, select [facility], and log in with your MyChart username and password.

## 2024-08-30 NOTE — Telephone Encounter (Signed)
 I thinkHe just needs to be seen by either me or one of the APP's.  I have not seen him in the long time I have taken only saw him once a couple years ago.  He has a lot going on that we just do not know is causing what.  It could just be sinus tachycardia from dehydration. If she is very concerned and would probably need to be seen urgently which would mean potentially urgent care or ER visit.  But would probably start with PCP and maybe we can get him in to be seen soon.   Alm Clay, MD

## 2024-08-30 NOTE — Progress Notes (Signed)
 Confirmed blood orders with Garrel in the blood bank.

## 2024-08-30 NOTE — Progress Notes (Signed)
 Palliative Medicine [facility]  [phone] Fax:(336) [phone]   Name: Adam Wise Date: 08/30/2024 MRN: [id number]  DOB: 03/28/1951  Patient Care Team: [name], NP as PCP - General (Internal Medicine) Anner Alm ORN, MD as PCP - Cardiology (Cardiology) Evertt Lonell BROCKS, RN as Oncology Nurse Navigator Pickenpack-Cousar, Fannie SAILOR, NP as Nurse Practitioner (Hospice and Palliative Medicine) Heilingoetter, Calton CROME, PA-C as Physician Assistant (Physician Assistant)    INTERVAL HISTORY: Adam Wise is a 73 y.o. 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 Palliative ask to see for symptom management and goals of care.   SOCIAL HISTORY:     reports that he has quit smoking. He has never used smokeless tobacco. He reports that he does not currently use alcohol. He reports that he does not currently use drugs.  ADVANCE DIRECTIVES:  Advanced directives on file naming Tiras Bianchini, [name], [name], and [name] as decision makers for this pt should Adam Wise become unable to speak for himself.   CODE STATUS: Full code  PAST MEDICAL HISTORY: Past Medical History:  Diagnosis Date   Acute on chronic respiratory failure with hypoxia (HCC) 10/12/2021   Chickenpox    Chronic knee pain    Chronic low back pain    COPD exacerbation (HCC) 10/11/2021   COPD with exacerbation (HCC) 10/12/2021   Coronary artery calcification seen on CAT scan 11/2021   Coronary CTA 11/27/2021: Moderate to large right pleural effusion and compressive atelectasis right lung base. => Coronary Calcium Score 657.  Diffuse RCA calcification.  Minimal mild disease in the LAD and diagonal branches. == Overall limited study.  Notable artifact.   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 He has disease progression after the first line of chemotherapy in December 2021.   Testosterone  deficiency    Type 2 diabetes mellitus (HCC)     ALLERGIES:  is allergic to bupropion and hydrochlorothiazide.  MEDICATIONS:  Current Outpatient Medications  Medication Sig Dispense Refill   albuterol  (VENTOLIN  HFA) 108 (90 Base) MCG/ACT inhaler Inhale 2 puffs into the lungs every 4 (four) hours as needed for shortness of breath. 18 each 0   B-D 3CC LUER-LOK SYR 22GX1 22G X 1 3 ML MISC USE AS INSTRUCTED FOR TESTOSTERONE  INJECTION EVERY 2 WEEKS 10 each 2   Black Pepper-Turmeric (TURMERIC COMPLEX/BLACK PEPPER PO) Take 1 tablet by mouth in the morning and at bedtime.     blood glucose meter kit and supplies KIT Dispense based on patient and insurance preference. Use up to four times daily as directed. 1 each 0   Calcium Carb-Cholecalciferol (CALCIUM PLUS VITAMIN D3 PO) Take 1 tablet by mouth in the morning and at bedtime.     Coenzyme Q10 (COQ10) 200 MG CAPS Take 200 mg by mouth daily.     Continuous Glucose Sensor (FREESTYLE LIBRE 3 PLUS SENSOR) MISC Use to check blood sugar continuously. Change sensor every 15 days. 6  each 1   cyclobenzaprine  (FLEXERIL ) 10 MG tablet Take 1 tablet (10 mg total) by mouth 2 (two) times daily as needed for muscle spasms. 20 tablet 0   docusate sodium  (COLACE) 100 MG capsule Take 1 capsule (100 mg total) by mouth every 12 (twelve) hours. 60 capsule 0   ELIQUIS  5 MG TABS tablet TAKE 1 TABLET BY MOUTH TWICE A DAY 180 tablet 2   esomeprazole (NEXIUM) 20 MG capsule Take 20 mg by  mouth in the morning and at bedtime.     gabapentin  (NEURONTIN ) 300 MG capsule Take 1 capsule (300 mg total) by mouth 2 (two) times daily. For back pain. 180 capsule 1   glucose blood (ONETOUCH ULTRA) test strip USE UP TO 4 TIMES DAILY AS DIRECTED 400 strip 5   Insulin  Pen Needle (BD PEN NEEDLE NANO U/F) 32G X 4 MM MISC Use with insulin  as prescribed Dx Code: E11.9 100 each 3   ipratropium-albuterol  (DUONEB) 0.5-2.5 (3) MG/3ML SOLN Take 3 mLs by nebulization every 4 (four) hours while awake for 3 days, THEN 3 mLs every 4 (four) hours as needed (shortness of breath or wheezing). 360 mL 0   KRILL OIL PO Take 1 capsule by mouth daily.     Lancets MISC USE UP TO 3 TIMES DAILY AS DIRECTED 100 each 2   lidocaine  (LIDODERM ) 5 % Place 1 patch onto the skin daily. Remove & Discard patch within 12 hours or as directed by MD 30 patch 0   losartan  (COZAAR ) 50 MG tablet TAKE 1 TABLET (50 MG TOTAL) BY MOUTH DAILY. FOR BLOOD PRESSURE. 90 tablet 2   metFORMIN  (GLUCOPHAGE ) 1000 MG tablet TAKE 1 TABLET (1,000 MG TOTAL) BY MOUTH 2 (TWO) TIMES DAILY WITH A MEAL. FOR DIABETES 180 tablet 0   mirtazapine  (REMERON ) 15 MG tablet TAKE 1 TABLET BY MOUTH EVERYDAY AT BEDTIME 90 tablet 1   morphine  (MS CONTIN ) 30 MG 12 hr tablet Take 1 tablet (30 mg total) by mouth every 8 (eight) hours as needed. 90 tablet 0   Multiple Vitamin (MULTI-VITAMINS) TABS Take 1 tablet by mouth daily with breakfast.     Naloxone  HCl 3 MG/0.1ML LIQD Place 1 spray into both nostrils once. For known/suspected opiod overdose. Every 2-3 minutes in alternating nostril till EMS arrives.     ondansetron  (ZOFRAN ) 8 MG tablet Take 1 tablet (8 mg total) by mouth every 8 (eight) hours as needed for nausea or vomiting. 30 tablet 3   oxyCODONE -acetaminophen  (PERCOCET) 10-325 MG tablet Take 1-1.5 tablets by mouth every 4 (four) hours as needed for pain. 90 tablet 0   pioglitazone  (ACTOS ) 45 MG tablet TAKE 1 TABLET (45 MG TOTAL) BY MOUTH DAILY. FOR DIABETES. 90 tablet  1   polyethylene glycol powder (GLYCOLAX /MIRALAX ) 17 GM/SCOOP powder Take 17 g by mouth daily as needed for moderate constipation or mild constipation.     pravastatin  (PRAVACHOL ) 40 MG tablet TAKE 1 TABLET BY MOUTH EVERY DAY IN THE EVENING FOR CHOLESTEROL 90 tablet 2   PRESCRIPTION MEDICATION CPAP- At bedtime     testosterone  cypionate (DEPOTESTOSTERONE CYPIONATE) 200 MG/ML injection Inject 200 mg into the muscle every 14 (fourteen) days.     No current facility-administered medications for this visit.   Facility-Administered Medications Ordered in Other Visits  Medication Dose Route Frequency Provider Last Rate Last Admin   heparin  lock flush 100 unit/mL  500 Units Intracatheter Once Sherrod Sherrod, MD       sodium chloride  flush (NS) 0.9 % injection  10 mL  10 mL Intracatheter Once Sherrod Sherrod, MD        VITAL SIGNS: There were no vitals taken for this visit. There were no vitals filed for this visit.  Estimated body mass index is 33.85 kg/m as calculated from the following:   Height as of 08/04/24: 5' 9 (1.753 m).   Weight as of an earlier encounter on 08/30/24: 229 lb 3.2 oz (104 kg).   PERFORMANCE STATUS (ECOG) : 1 - Symptomatic but completely ambulatory   Physical Exam General: NAD Cardiovascular: regular rate and rhythm Pulmonary: normal breathing pattern Extremities: no edema, no joint deformities Skin: no rashes Neurological: AAO x3  IMPRESSION: Discussed the use of AI scribe software for clinical note transcription with the patient, who gave verbal consent to proceed.  History of Present Illness Adam Wise is a 73 year old male with lung cancer who presented to clinic for symptom management follow-up.  Denies concerns of nausea, vomiting, or diarrhea. He is experiencing constipation, for which he takes Miralax . Recommended addition of 2 Senna tablets at bedtime. Persistent fatigue and minimal activity due to pain.   We discussed his current oral  regimen which includes MS Contin  30 mg every 8 hours, Percocet 10/325mg  every 4-6 hours. He is having to take medication around the clock. Patient shares some days he is only taking MS Contin  every 12 hours. Education provided on use and efficacy of both long-acting and short acting medication.   He experiences ongoing lower and mid-back pain, describing it as 'burning' and sharp, and stabbing.' Epidural steroid injections have provided minimal relief, and he is scheduled for another round of injections on both sides of the affected area next Monday enlarging area. Wife shares per provider if injections are not successful they will consider lesion ablation. I also approached consideration of intrathecal pain pump placement as final resort if he does not gain much relief. Education provided on use, placement, and procedure of pump placement including test dose process. Adam Wise and his wife verbalized understanding. We will plan to continue close follow-up.   He has started using a 5% lidocaine  patch, which was recently approved by his insurance, and today is the first day he is trying it during the day.  All questions answered and support provided.   I discussed the importance of continued conversation with family and their medical providers regarding overall plan of care and treatment options, ensuring decisions are within the context of the patients values and GOCs.  Assessment & Plan Cancer pain  Cancer pain syndrome with recent hospitalizations due to pain exacerbations. Currently managed with morphine  and gabapentin . Scheduled for a pain injection by anesthesiology. If ineffective, radiofrequency lesions may be considered. Chronic back pain in the mid-lower and upper back, described as burning and stabbing. Previous epidural steroid injection provided minimal relief. Discussion of potential future use of an intrathecal pain pump if pain persists, which has been successful in other patients in reducing  or eliminating the need for oral pain medications.  - Refill morphine  prescription. MS Contin  30 mg every 8 hours.  - Continue gabapentin  300mg  three times daily.  -Percocet 10/325mg  1-1.5 tablets every 4 hours as needed.  - Consider radiofrequency lesions if injection is ineffective per wife - Evaluate for intrathecal pain pump if pain persists over the next month  Constipation Constipation managed with Miralax . Recent episode required plumbing intervention. - Add two Senna tablets at bedtime in addition to Miralax . - Increase to two Senna tablets twice a  day if constipation persists.  I will plan to see patient back in 2-3 weeks. Sooner if needed.  Patient expressed understanding and was in agreement with this plan. He also understands that He can call the clinic at any time with any questions, concerns, or complaints.   Any controlled substances utilized were prescribed in the context of palliative care. PDMP has been reviewed.   Visit consisted of counseling and education dealing with the complex and emotionally intense issues of symptom management and palliative care in the setting of serious and potentially life-threatening illness.  Levon Borer, AGPCNP-BC  Palliative Medicine Team/[facility]

## 2024-08-31 ENCOUNTER — Inpatient Hospital Stay: Payer: PRIVATE HEALTH INSURANCE

## 2024-08-31 VITALS — BP 109/75 | HR 98 | Temp 97.6°F | Resp 17

## 2024-08-31 DIAGNOSIS — Z5111 Encounter for antineoplastic chemotherapy: Secondary | ICD-10-CM | POA: Diagnosis not present

## 2024-08-31 DIAGNOSIS — Z515 Encounter for palliative care: Secondary | ICD-10-CM | POA: Diagnosis not present

## 2024-08-31 DIAGNOSIS — C787 Secondary malignant neoplasm of liver and intrahepatic bile duct: Secondary | ICD-10-CM | POA: Diagnosis not present

## 2024-08-31 DIAGNOSIS — C7951 Secondary malignant neoplasm of bone: Secondary | ICD-10-CM | POA: Diagnosis not present

## 2024-08-31 DIAGNOSIS — C3411 Malignant neoplasm of upper lobe, right bronchus or lung: Secondary | ICD-10-CM

## 2024-08-31 DIAGNOSIS — G893 Neoplasm related pain (acute) (chronic): Secondary | ICD-10-CM | POA: Diagnosis not present

## 2024-08-31 LAB — BPAM RBC
Blood Product Expiration Date: 202510092359
ISSUE DATE / TIME: 202509161344
Unit Type and Rh: 7300

## 2024-08-31 LAB — TYPE AND SCREEN
ABO/RH(D): B POS
Antibody Screen: NEGATIVE
Unit division: 0

## 2024-08-31 MED ORDER — SODIUM CHLORIDE 0.9 % IV SOLN
80.0000 mg/m2 | Freq: Once | INTRAVENOUS | Status: AC
  Administered 2024-08-31: 200 mg via INTRAVENOUS
  Filled 2024-08-31: qty 10

## 2024-08-31 MED ORDER — SODIUM CHLORIDE 0.9 % IV SOLN: INTRAVENOUS | Status: DC

## 2024-08-31 MED ORDER — TRILACICLIB DIHYDROCHLORIDE INJECTION 300 MG
240.0000 mg/m2 | Freq: Once | INTRAVENOUS | Status: AC
  Administered 2024-08-31: 600 mg via INTRAVENOUS
  Filled 2024-08-31: qty 40

## 2024-08-31 MED ORDER — DEXAMETHASONE SODIUM PHOSPHATE 10 MG/ML IJ SOLN
10.0000 mg | Freq: Once | INTRAMUSCULAR | Status: AC
  Administered 2024-08-31: 10 mg via INTRAVENOUS
  Filled 2024-08-31: qty 1

## 2024-08-31 NOTE — Patient Instructions (Signed)
 [facility] - A DEPT OF [facility]  Discharge Instructions: Thank you for choosing [facility] to provide your oncology and hematology care.   If you have a lab appointment with the Cancer Center, please go directly to the Cancer Center and check in at the registration area.   Wear comfortable clothing and clothing appropriate for easy access to any Portacath or PICC line.   We strive to give you quality time with your provider. You may need to reschedule your appointment if you arrive late (15 or more minutes).  Arriving late affects you and other patients whose appointments are after yours.  Also, if you miss three or more appointments without notifying the office, you may be dismissed from the clinic at the provider's discretion.      For prescription refill requests, have your pharmacy contact our office and allow 72 hours for refills to be completed.    Today you received the following chemotherapy and/or immunotherapy agents: Etoposide  (Vepesid ) & Cosela     To help prevent nausea and vomiting after your treatment, we encourage you to take your nausea medication as directed.  BELOW ARE SYMPTOMS THAT SHOULD BE REPORTED IMMEDIATELY: *FEVER GREATER THAN 100.4 F (38 C) OR HIGHER *CHILLS OR SWEATING *NAUSEA AND VOMITING THAT IS NOT CONTROLLED WITH YOUR NAUSEA MEDICATION *UNUSUAL SHORTNESS OF BREATH *UNUSUAL BRUISING OR BLEEDING *URINARY PROBLEMS (pain or burning when urinating, or frequent urination) *BOWEL PROBLEMS (unusual diarrhea, constipation, pain near the anus) TENDERNESS IN MOUTH AND THROAT WITH OR WITHOUT PRESENCE OF ULCERS (sore throat, sores in mouth, or a toothache) UNUSUAL RASH, SWELLING OR PAIN  UNUSUAL VAGINAL DISCHARGE OR ITCHING   Items with * indicate a potential emergency and should be followed up as soon as possible or go to the Emergency Department if any problems should occur.  Please show the CHEMOTHERAPY ALERT CARD  or IMMUNOTHERAPY ALERT CARD at check-in to the Emergency Department and triage nurse.  Should you have questions after your visit or need to cancel or reschedule your appointment, please contact [facility] - A DEPT OF [facility]  Dept: [phone]  and follow the prompts.  Office hours are 8:00 a.m. to 4:30 p.m. Monday - Friday. Please note that voicemails left after 4:00 p.m. may not be returned until the following business day.  We are closed weekends and major holidays. You have access to a nurse at all times for urgent questions. Please call the main number to the clinic Dept: [phone] and follow the prompts.   For any non-urgent questions, you may also contact your provider using MyChart. We now offer e-Visits for anyone 28 and older to request care online for non-urgent symptoms. For details visit [email or web address].   Also download the MyChart app! Go to the app store, search MyChart, open the app, select [facility], and log in with your MyChart username and password.

## 2024-08-31 NOTE — Telephone Encounter (Signed)
 Pt has an appointment scheduled with Lonni Meager, PA-C on 09/08/24

## 2024-09-01 ENCOUNTER — Inpatient Hospital Stay: Payer: PRIVATE HEALTH INSURANCE

## 2024-09-01 ENCOUNTER — Ambulatory Visit: Admitting: Cardiology

## 2024-09-01 VITALS — BP 130/84 | HR 105 | Temp 97.9°F | Resp 18

## 2024-09-01 DIAGNOSIS — Z5111 Encounter for antineoplastic chemotherapy: Secondary | ICD-10-CM | POA: Diagnosis not present

## 2024-09-01 DIAGNOSIS — C3411 Malignant neoplasm of upper lobe, right bronchus or lung: Secondary | ICD-10-CM

## 2024-09-01 DIAGNOSIS — G893 Neoplasm related pain (acute) (chronic): Secondary | ICD-10-CM | POA: Diagnosis not present

## 2024-09-01 DIAGNOSIS — Z515 Encounter for palliative care: Secondary | ICD-10-CM | POA: Diagnosis not present

## 2024-09-01 DIAGNOSIS — C787 Secondary malignant neoplasm of liver and intrahepatic bile duct: Secondary | ICD-10-CM | POA: Diagnosis not present

## 2024-09-01 DIAGNOSIS — C7951 Secondary malignant neoplasm of bone: Secondary | ICD-10-CM | POA: Diagnosis not present

## 2024-09-01 MED ORDER — SODIUM CHLORIDE 0.9 % IV SOLN
80.0000 mg/m2 | Freq: Once | INTRAVENOUS | Status: AC
  Administered 2024-09-01: 200 mg via INTRAVENOUS
  Filled 2024-09-01: qty 10

## 2024-09-01 MED ORDER — SODIUM CHLORIDE 0.9 % IV SOLN: INTRAVENOUS | Status: DC

## 2024-09-01 MED ORDER — DEXAMETHASONE SODIUM PHOSPHATE 10 MG/ML IJ SOLN
10.0000 mg | Freq: Once | INTRAMUSCULAR | Status: AC
  Administered 2024-09-01: 10 mg via INTRAVENOUS
  Filled 2024-09-01: qty 1

## 2024-09-01 MED ORDER — TRILACICLIB DIHYDROCHLORIDE INJECTION 300 MG
240.0000 mg/m2 | Freq: Once | INTRAVENOUS | Status: AC
  Administered 2024-09-01: 600 mg via INTRAVENOUS
  Filled 2024-09-01: qty 40

## 2024-09-01 NOTE — Patient Instructions (Signed)
 [facility] - A DEPT OF [facility]  Discharge Instructions: Thank you for choosing [facility] to provide your oncology and hematology care.   If you have a lab appointment with the Cancer Center, please go directly to the Cancer Center and check in at the registration area.   Wear comfortable clothing and clothing appropriate for easy access to any Portacath or PICC line.   We strive to give you quality time with your provider. You may need to reschedule your appointment if you arrive late (15 or more minutes).  Arriving late affects you and other patients whose appointments are after yours.  Also, if you miss three or more appointments without notifying the office, you may be dismissed from the clinic at the provider's discretion.      For prescription refill requests, have your pharmacy contact our office and allow 72 hours for refills to be completed.    Today you received the following chemotherapy and/or immunotherapy agents: Etoposide  (Vepesid ) & Cosela     To help prevent nausea and vomiting after your treatment, we encourage you to take your nausea medication as directed.  BELOW ARE SYMPTOMS THAT SHOULD BE REPORTED IMMEDIATELY: *FEVER GREATER THAN 100.4 F (38 C) OR HIGHER *CHILLS OR SWEATING *NAUSEA AND VOMITING THAT IS NOT CONTROLLED WITH YOUR NAUSEA MEDICATION *UNUSUAL SHORTNESS OF BREATH *UNUSUAL BRUISING OR BLEEDING *URINARY PROBLEMS (pain or burning when urinating, or frequent urination) *BOWEL PROBLEMS (unusual diarrhea, constipation, pain near the anus) TENDERNESS IN MOUTH AND THROAT WITH OR WITHOUT PRESENCE OF ULCERS (sore throat, sores in mouth, or a toothache) UNUSUAL RASH, SWELLING OR PAIN  UNUSUAL VAGINAL DISCHARGE OR ITCHING   Items with * indicate a potential emergency and should be followed up as soon as possible or go to the Emergency Department if any problems should occur.  Please show the CHEMOTHERAPY ALERT CARD  or IMMUNOTHERAPY ALERT CARD at check-in to the Emergency Department and triage nurse.  Should you have questions after your visit or need to cancel or reschedule your appointment, please contact [facility] - A DEPT OF [facility]  Dept: [phone]  and follow the prompts.  Office hours are 8:00 a.m. to 4:30 p.m. Monday - Friday. Please note that voicemails left after 4:00 p.m. may not be returned until the following business day.  We are closed weekends and major holidays. You have access to a nurse at all times for urgent questions. Please call the main number to the clinic Dept: [phone] and follow the prompts.   For any non-urgent questions, you may also contact your provider using MyChart. We now offer e-Visits for anyone 28 and older to request care online for non-urgent symptoms. For details visit [email or web address].   Also download the MyChart app! Go to the app store, search MyChart, open the app, select [facility], and log in with your MyChart username and password.

## 2024-09-01 NOTE — Progress Notes (Signed)
 OK to treat D3 Etoposide  w/ pulse 105 per Cassie PA

## 2024-09-02 ENCOUNTER — Ambulatory Visit: Payer: PRIVATE HEALTH INSURANCE

## 2024-09-02 DIAGNOSIS — E119 Type 2 diabetes mellitus without complications: Secondary | ICD-10-CM | POA: Diagnosis not present

## 2024-09-02 DIAGNOSIS — I2510 Atherosclerotic heart disease of native coronary artery without angina pectoris: Secondary | ICD-10-CM | POA: Diagnosis not present

## 2024-09-02 DIAGNOSIS — C3411 Malignant neoplasm of upper lobe, right bronchus or lung: Secondary | ICD-10-CM | POA: Diagnosis not present

## 2024-09-02 DIAGNOSIS — J449 Chronic obstructive pulmonary disease, unspecified: Secondary | ICD-10-CM | POA: Diagnosis not present

## 2024-09-02 DIAGNOSIS — I10 Essential (primary) hypertension: Secondary | ICD-10-CM | POA: Diagnosis not present

## 2024-09-02 DIAGNOSIS — J9611 Chronic respiratory failure with hypoxia: Secondary | ICD-10-CM | POA: Diagnosis not present

## 2024-09-05 ENCOUNTER — Inpatient Hospital Stay

## 2024-09-05 DIAGNOSIS — M47816 Spondylosis without myelopathy or radiculopathy, lumbar region: Secondary | ICD-10-CM | POA: Diagnosis not present

## 2024-09-06 ENCOUNTER — Other Ambulatory Visit: Payer: Self-pay

## 2024-09-06 ENCOUNTER — Ambulatory Visit (HOSPITAL_COMMUNITY)
Admission: RE | Admit: 2024-09-06 | Discharge: 2024-09-06 | Disposition: A | Discharge status: Home / Self Care | Source: Ambulatory Visit | Attending: Radiation Oncology | Admitting: Radiation Oncology

## 2024-09-06 ENCOUNTER — Ambulatory Visit: Admitting: Nurse Practitioner

## 2024-09-06 ENCOUNTER — Inpatient Hospital Stay

## 2024-09-06 DIAGNOSIS — Z515 Encounter for palliative care: Secondary | ICD-10-CM | POA: Diagnosis not present

## 2024-09-06 DIAGNOSIS — E119 Type 2 diabetes mellitus without complications: Secondary | ICD-10-CM | POA: Diagnosis not present

## 2024-09-06 DIAGNOSIS — D696 Thrombocytopenia, unspecified: Secondary | ICD-10-CM

## 2024-09-06 DIAGNOSIS — J449 Chronic obstructive pulmonary disease, unspecified: Secondary | ICD-10-CM | POA: Diagnosis not present

## 2024-09-06 DIAGNOSIS — G893 Neoplasm related pain (acute) (chronic): Secondary | ICD-10-CM | POA: Diagnosis not present

## 2024-09-06 DIAGNOSIS — Z5111 Encounter for antineoplastic chemotherapy: Secondary | ICD-10-CM | POA: Diagnosis not present

## 2024-09-06 DIAGNOSIS — D649 Anemia, unspecified: Secondary | ICD-10-CM

## 2024-09-06 DIAGNOSIS — I2510 Atherosclerotic heart disease of native coronary artery without angina pectoris: Secondary | ICD-10-CM | POA: Diagnosis not present

## 2024-09-06 DIAGNOSIS — C787 Secondary malignant neoplasm of liver and intrahepatic bile duct: Secondary | ICD-10-CM | POA: Diagnosis not present

## 2024-09-06 DIAGNOSIS — C3490 Malignant neoplasm of unspecified part of unspecified bronchus or lung: Secondary | ICD-10-CM | POA: Diagnosis not present

## 2024-09-06 DIAGNOSIS — C3411 Malignant neoplasm of upper lobe, right bronchus or lung: Secondary | ICD-10-CM

## 2024-09-06 DIAGNOSIS — J9611 Chronic respiratory failure with hypoxia: Secondary | ICD-10-CM | POA: Diagnosis not present

## 2024-09-06 DIAGNOSIS — M48061 Spinal stenosis, lumbar region without neurogenic claudication: Secondary | ICD-10-CM

## 2024-09-06 DIAGNOSIS — C7951 Secondary malignant neoplasm of bone: Secondary | ICD-10-CM | POA: Diagnosis not present

## 2024-09-06 DIAGNOSIS — I10 Essential (primary) hypertension: Secondary | ICD-10-CM | POA: Diagnosis not present

## 2024-09-06 LAB — CBC WITH DIFFERENTIAL (CANCER CENTER ONLY)
Abs Immature Granulocytes: 0.08 K/uL — ABNORMAL HIGH (ref 0.00–0.07)
Basophils Absolute: 0 K/uL (ref 0.0–0.1)
Basophils Relative: 1 %
Eosinophils Absolute: 0 K/uL (ref 0.0–0.5)
Eosinophils Relative: 1 %
HCT: 28.4 % — ABNORMAL LOW (ref 39.0–52.0)
Hemoglobin: 9.1 g/dL — ABNORMAL LOW (ref 13.0–17.0)
Immature Granulocytes: 4 %
Lymphocytes Relative: 10 %
Lymphs Abs: 0.2 K/uL — ABNORMAL LOW (ref 0.7–4.0)
MCH: 30.8 pg (ref 26.0–34.0)
MCHC: 32 g/dL (ref 30.0–36.0)
MCV: 96.3 fL (ref 80.0–100.0)
Monocytes Absolute: 0.1 K/uL (ref 0.1–1.0)
Monocytes Relative: 4 %
Neutro Abs: 1.8 K/uL (ref 1.7–7.7)
Neutrophils Relative %: 80 %
Platelet Count: 173 K/uL (ref 150–400)
RBC: 2.95 MIL/uL — ABNORMAL LOW (ref 4.22–5.81)
RDW: 16.7 % — ABNORMAL HIGH (ref 11.5–15.5)
WBC Count: 2.2 K/uL — ABNORMAL LOW (ref 4.0–10.5)
nRBC: 0 % (ref 0.0–0.2)

## 2024-09-06 LAB — CMP (CANCER CENTER ONLY)
ALT: 21 U/L (ref 0–44)
AST: 24 U/L (ref 15–41)
Albumin: 3.4 g/dL — ABNORMAL LOW (ref 3.5–5.0)
Alkaline Phosphatase: 159 U/L — ABNORMAL HIGH (ref 38–126)
Anion gap: 9 (ref 5–15)
BUN: 28 mg/dL — ABNORMAL HIGH (ref 8–23)
CO2: 29 mmol/L (ref 22–32)
Calcium: 8.8 mg/dL — ABNORMAL LOW (ref 8.9–10.3)
Chloride: 99 mmol/L (ref 98–111)
Creatinine: 0.84 mg/dL (ref 0.61–1.24)
GFR, Estimated: >60 mL/min (ref >60)
Glucose, Bld: 164 mg/dL — ABNORMAL HIGH (ref 70–99)
Potassium: 4.5 mmol/L (ref 3.5–5.1)
Sodium: 137 mmol/L (ref 135–145)
Total Bilirubin: 0.4 mg/dL (ref 0.0–1.2)
Total Protein: 6.2 g/dL — ABNORMAL LOW (ref 6.5–8.1)

## 2024-09-06 LAB — SAMPLE TO BLOOD BANK

## 2024-09-06 MED ORDER — MORPHINE SULFATE ER 30 MG PO TBCR: 30.0000 mg | EXTENDED_RELEASE_TABLET | Freq: Three times a day (TID) | ORAL | 0 refills | Status: DC | PRN

## 2024-09-06 MED ORDER — HEPARIN SOD (PORK) LOCK FLUSH 100 UNIT/ML IV SOLN
500.0000 [IU] | Freq: Once | INTRAVENOUS | Status: AC
  Administered 2024-09-06: 500 [IU] via INTRAVENOUS

## 2024-09-06 MED ORDER — GADOBUTROL 1 MMOL/ML IV SOLN
10.0000 mL | Freq: Once | INTRAVENOUS | Status: AC | PRN
Start: 2024-09-06 — End: 2024-09-06
  Administered 2024-09-06: 10 mL via INTRAVENOUS

## 2024-09-06 MED ORDER — LIDOCAINE 5 % EX PTCH: 1.0000 | MEDICATED_PATCH | CUTANEOUS | 3 refills | Status: DC

## 2024-09-06 MED ORDER — OXYCODONE-ACETAMINOPHEN 10-325 MG PO TABS: 1.0000 | ORAL_TABLET | ORAL | 0 refills | Status: DC | PRN

## 2024-09-08 ENCOUNTER — Ambulatory Visit: Attending: Nurse Practitioner | Admitting: Nurse Practitioner

## 2024-09-08 ENCOUNTER — Encounter: Payer: Self-pay | Admitting: Nurse Practitioner

## 2024-09-08 VITALS — BP 90/64 | HR 116 | Ht 69.0 in | Wt 224.1 lb

## 2024-09-08 DIAGNOSIS — E119 Type 2 diabetes mellitus without complications: Secondary | ICD-10-CM | POA: Diagnosis not present

## 2024-09-08 DIAGNOSIS — I2510 Atherosclerotic heart disease of native coronary artery without angina pectoris: Secondary | ICD-10-CM | POA: Insufficient documentation

## 2024-09-08 DIAGNOSIS — I5032 Chronic diastolic (congestive) heart failure: Secondary | ICD-10-CM | POA: Insufficient documentation

## 2024-09-08 DIAGNOSIS — E785 Hyperlipidemia, unspecified: Secondary | ICD-10-CM | POA: Diagnosis not present

## 2024-09-08 DIAGNOSIS — Z794 Long term (current) use of insulin: Secondary | ICD-10-CM | POA: Insufficient documentation

## 2024-09-08 DIAGNOSIS — G4733 Obstructive sleep apnea (adult) (pediatric): Secondary | ICD-10-CM | POA: Diagnosis not present

## 2024-09-08 DIAGNOSIS — I10 Essential (primary) hypertension: Secondary | ICD-10-CM | POA: Diagnosis not present

## 2024-09-08 DIAGNOSIS — C3411 Malignant neoplasm of upper lobe, right bronchus or lung: Secondary | ICD-10-CM | POA: Diagnosis not present

## 2024-09-08 DIAGNOSIS — R000 Tachycardia, unspecified: Secondary | ICD-10-CM | POA: Diagnosis not present

## 2024-09-08 DIAGNOSIS — R0609 Other forms of dyspnea: Secondary | ICD-10-CM | POA: Insufficient documentation

## 2024-09-08 DIAGNOSIS — E1169 Type 2 diabetes mellitus with other specified complication: Secondary | ICD-10-CM | POA: Insufficient documentation

## 2024-09-08 DIAGNOSIS — I959 Hypotension, unspecified: Secondary | ICD-10-CM | POA: Insufficient documentation

## 2024-09-08 MED ORDER — LOSARTAN POTASSIUM 25 MG PO TABS: 25.0000 mg | ORAL_TABLET | Freq: Every day | ORAL | Status: DC

## 2024-09-08 NOTE — Patient Instructions (Signed)
 Medication Instructions:  Your physician recommends the following medication changes.  DECREASE: Losartan  from 50 mg to 25 mg ( 1/2 tablet)  Your physician recommends that you continue all other current medications as directed. Please refer to the Current Medication list given to you today.   *If you need a refill on your cardiac medications before your next appointment, please call your pharmacy*  Lab Work: No labs ordered today  If you have labs (blood work) drawn today and your tests are completely normal, you will receive your results only by: MyChart Message (if you have MyChart) OR A paper copy in the mail If you have any lab test that is abnormal or we need to change your treatment, we will call you to review the results.  Testing/Procedures: Your physician has requested that you have an echocardiogram. Echocardiography is a painless test that uses sound waves to create images of your heart. It provides your doctor with information about the size and shape of your heart and how well your heart's chambers and valves are working.   You may receive an ultrasound enhancing agent through an IV if needed to better visualize your heart during the echo. This procedure takes approximately one hour.  There are no restrictions for this procedure.  This will take place at [address] [facility] [address], [place] [address]  Please note: We ask at that you not bring children with you during ultrasound (echo/ vascular) testing. Due to room size and safety concerns, children are not allowed in the ultrasound rooms during exams. Our front office staff cannot provide observation of children in our lobby area while testing is being conducted. An adult accompanying a patient to their appointment will only be allowed in the ultrasound room at the discretion of the ultrasound technician under special circumstances. We apologize for any inconvenience.   Follow-Up: At [facility], you and your health needs are our priority.  As part of our continuing mission to provide you with exceptional heart care, our providers are all part of one team.  This team includes your primary Cardiologist (physician) and Advanced Practice Providers or APPs (Physician Assistants and Nurse Practitioners) who all work together to provide you with the care you need, when you need it.  Your next appointment:   3 month(s)  Provider:   Alm Clay, MD or Lonni Meager, NP   We recommend signing up for the patient portal called MyChart.  Sign up information is provided on this After Visit Summary.  MyChart is used to connect with patients for Virtual Visits (Telemedicine).  Patients are able to view lab/test results, encounter notes, upcoming appointments, etc.  Non-urgent messages can be sent to your provider as well.   To learn more about what you can do with MyChart, go to [email or web address].

## 2024-09-08 NOTE — Progress Notes (Signed)
 Office Visit    Patient Name: Adam Wise Date of Encounter: 09/08/2024  Primary Care Provider:  [name], NP Primary Cardiologist:  Alm Clay, MD  Cardiology APP:  Vivienne Lonni Ingle, NP   Chief Complaint    73 y.o. male w/ a h/o metastatic lung cancer w/ liver, bone, and brain metastases, COPD, nonobstructive CAD, HTN, HL, DMII, aortic atherosclerosis, dilated aorta, obstructive sleep apnea, pulmonary hypertension, recurrent pleural effusions, pulmonary embolism on Eliquis , chronic pain, anemia, severe lumbar stenosis and GERD, who presents for follow-up related to tachycardia and shortness of breath.  Past Medical History   Subjective   Past Medical History:  Diagnosis Date   Acute on chronic respiratory failure with hypoxia (HCC) 10/12/2021   Chickenpox    Chronic knee pain    Chronic low back pain    COPD exacerbation (HCC) 10/11/2021   COPD with exacerbation (HCC) 10/12/2021   Coronary artery calcification seen on CAT scan 11/2021   Coronary CTA 11/27/2021: Moderate to large right pleural effusion and compressive atelectasis right lung base. => Coronary Calcium Score 657.  Diffuse RCA calcification.  Minimal mild disease in the LAD and diagonal branches. == Overall limited study.  Notable artifact.   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 He has disease progression after the first line of chemotherapy in December 2021.   Testosterone  deficiency    Type 2 diabetes  mellitus (HCC)    Past Surgical History:  Procedure Laterality Date   CHEST TUBE INSERTION Right 01/20/2022   Procedure: CHEST TUBE INSERTION;  Surgeon: Claudene Toribio BROCKS, MD;  Location: [facility] ENDOSCOPY;  Service: Pulmonary;  Laterality: Right;  w/ Talc  Pleurodesis, planned admit for Obs afterwards   CHEST TUBE INSERTION Right 04/28/2022   Procedure: INSERTION PLEURAL DRAINAGE CATHETER - Pigtail tail, drainage;  Surgeon: Claudene Toribio BROCKS, MD;  Location: [facility] ENDOSCOPY;  Service: Pulmonary;  Laterality: Right;  talc  pleurodesis   CHEST TUBE INSERTION Right 09/01/2022   Procedure: INSERTION PLEURAL DRAINAGE CATHETER;  Surgeon: Alaine Vicenta NOVAK, MD;  Location: [facility] ENDOSCOPY;  Service: Cardiopulmonary;  Laterality: Right;  pigtail catheter placement with talc  pleurodesis   COLONOSCOPY WITH PROPOFOL  N/A 12/17/2018   Procedure: COLONOSCOPY WITH PROPOFOL ;  Surgeon: Janalyn Keene NOVAK, MD;  Location: [facility] ENDOSCOPY;  Service: Gastroenterology;  Laterality: N/A;   IR IMAGING GUIDED PORT INSERTION  04/17/2020   IR THORACENTESIS ASP PLEURAL SPACE W/IMG GUIDE  11/29/2021   IR THORACENTESIS ASP PLEURAL SPACE W/IMG GUIDE  01/03/2022   JOINT REPLACEMENT Bilateral    REPLACEMENT TOTAL KNEE BILATERAL  2015   TALC  PLEURODESIS  09/01/2022   Procedure: TALC  PLEURADESIS;  Surgeon: Alaine Vicenta NOVAK, MD;  Location: [facility] ENDOSCOPY;  Service: Cardiopulmonary;;   TONSILLECTOMY  1960   TRANSTHORACIC ECHOCARDIOGRAM  05/2020   a) 05/2020: EF 55 to 60%.  No R WMA.  Mild LVH.  Indeterminate LVEDP.  Unable to assess RVP.  Normal aortic and mitral valves.  Mildly elevated RAP.; b) 09/2021: EF 50-55%. No RWMA. Mild LVH. ~ LVEDP. Mild LA  Dilation. NORMAL RV/RAP.  Normal MV/AoV.    Allergies  Allergies  Allergen Reactions   Bupropion Other (See Comments) and Dermatitis    Racing heart  palpitations  Racing heart  palpitations Racing heart palpitations Racing heart    palpitations    Racing heart    palpitations Racing heart  palpitations Racing heart    palpitations, Racing heart, palpitations, Racing heart   Hydrochlorothiazide Other (See Comments)    Cramping to lower extremities       History of Present Illness      73 y.o. y/o male  w/ a h/o metastatic lung cancer w/ liver, bone, and brain metastases, COPD, nonobstructive CAD, HTN, HL, DMII, aortic atherosclerosis, dilated aorta, obstructive sleep apnea, pulmonary hypertension, recurrent pleural effusions, pulmonary embolism on Eliquis , chronic pain, anemia, severe lumbar stenosis and GERD.  Patient previously establish care with Dr. Anner in November 2022 in the setting of progressive dyspnea exertion and atypical chest discomfort.  Echocardiogram showed an EF of 50-55% without regional wall motion abnormalities or significant valvular disease.  Coronary CT angiogram in December 2022 showed a calcium score of 657 (77th percentile) with moderate LAD and mild diagonal disease.  The ascending aorta measured 35 mm.  The pulmonary artery was dilated at 37 mm.  Symptoms of dyspnea and chest tightness were felt to be due to recurrent pleural effusion which improved following thoracentesis and he was medically managed from a cardiac standpoint.   Patient was last seen in cardiology clinic in April 2023.  Most recent echo in August 2024 showed an EF of 60-65% without regional wall motion abnormalities.  He was admitted to the hospital in August 2025 in the setting of intractable back pain, which was treated with steroids.  ECG during hospitalization showed was concerning for Mobitz 2 AV block but following review with cardiology, it was felt to represent Mobitz 1.  He was subsequently seen in the emergency department but seen again in the ER at [facility] on August 30 due to hematuria and weakness.  ECG was unremarkable.  MRI of the spine showed spinal stenosis but no cord compression or signs of cauda equina.  CT imaging showed lung mass with liver metastases, enlarged  pulmonary artery, bladder thickening, bilateral L5 pars interarticularis defects, and 4 cm ascending thoracic aorta.  He was subsequently discharged from the ED.  His wife contacted our office on September 5 due to dyspnea and tachycardia, an appointment was made for today.  He has not seen pain management at [facility] where he was normotensive and his heart rate was 69.  In the setting of chronic pain, patient's activity has been somewhat limited and he has noted some progression of dyspnea on exertion over the past year.  He does not experience chest pain.  He also has chronic, mild ankle edema which seems to be worse at night.  He does spend long periods with his feet in a dependent position and recently got compression socks.  He does not take a diuretic and prefers to avoid diuretic.  His wife points out that she is concerned that he is often dehydrated and is not drinking enough fluids anyway.  His blood pressures frequently trend in the 90s and with this, he feels weak.  He is interested in reducing his losartan .  He does not experience palpitations, PND, orthopnea, syncope, or early satiety.   Objective   Home Medications    Current Outpatient Medications  Medication Sig Dispense Refill   albuterol  (VENTOLIN   HFA) 108 (90 Base) MCG/ACT inhaler Inhale 2 puffs into the lungs every 4 (four) hours as needed for shortness of breath. 18 each 0   B-D 3CC LUER-LOK SYR 22GX1 22G X 1 3 ML MISC USE AS INSTRUCTED FOR TESTOSTERONE  INJECTION EVERY 2 WEEKS 10 each 2   Black Pepper-Turmeric (TURMERIC COMPLEX/BLACK PEPPER PO) Take 1 tablet by mouth in the morning and at bedtime.     blood glucose meter kit and supplies KIT Dispense based on patient and insurance preference. Use up to four times daily as directed. 1 each 0   Calcium Carb-Cholecalciferol (CALCIUM PLUS VITAMIN D3 PO) Take 1 tablet by mouth in the morning and at bedtime.     Coenzyme Q10 (COQ10) 200 MG CAPS Take 200 mg by mouth daily.      Continuous Glucose Sensor (FREESTYLE LIBRE 3 PLUS SENSOR) MISC Use to check blood sugar continuously. Change sensor every 15 days. 6 each 1   cyclobenzaprine  (FLEXERIL ) 10 MG tablet Take 1 tablet (10 mg total) by mouth 2 (two) times daily as needed for muscle spasms. 20 tablet 0   docusate sodium  (COLACE) 100 MG capsule Take 1 capsule (100 mg total) by mouth every 12 (twelve) hours. 60 capsule 0   ELIQUIS  5 MG TABS tablet TAKE 1 TABLET BY MOUTH TWICE A DAY 180 tablet 2   esomeprazole (NEXIUM) 20 MG capsule Take 20 mg by mouth in the morning and at bedtime.     gabapentin  (NEURONTIN ) 300 MG capsule Take 1 capsule (300 mg total) by mouth 2 (two) times daily. For back pain. 180 capsule 1   glucose blood (ONETOUCH ULTRA) test strip USE UP TO 4 TIMES DAILY AS DIRECTED 400 strip 5   Insulin  Pen Needle (BD PEN NEEDLE NANO U/F) 32G X 4 MM MISC Use with insulin  as prescribed Dx Code: E11.9 100 each 3   ipratropium-albuterol  (DUONEB) 0.5-2.5 (3) MG/3ML SOLN Take 3 mLs by nebulization every 4 (four) hours while awake for 3 days, THEN 3 mLs every 4 (four) hours as needed (shortness of breath or wheezing). 360 mL 0   KRILL OIL PO Take 1 capsule by mouth daily.     Lancets MISC USE UP TO 3 TIMES DAILY AS DIRECTED 100 each 2   lidocaine  (LIDODERM ) 5 % Place 1 patch onto the skin daily. Remove & Discard patch within 12 hours or as directed by MD 30 patch 3   losartan  (COZAAR ) 50 MG tablet TAKE 1 TABLET (50 MG TOTAL) BY MOUTH DAILY. FOR BLOOD PRESSURE. 90 tablet 2   metFORMIN  (GLUCOPHAGE ) 1000 MG tablet TAKE 1 TABLET (1,000 MG TOTAL) BY MOUTH 2 (TWO) TIMES DAILY WITH A MEAL. FOR DIABETES 180 tablet 0   mirtazapine  (REMERON ) 15 MG tablet TAKE 1 TABLET BY MOUTH EVERYDAY AT BEDTIME 90 tablet 1   [START ON 09/09/2024] morphine  (MS CONTIN ) 30 MG 12 hr tablet Take 1 tablet (30 mg total) by mouth every 8 (eight) hours as needed. 90 tablet 0   Multiple Vitamin (MULTI-VITAMINS) TABS Take 1 tablet by mouth daily with breakfast.      Naloxone  HCl 3 MG/0.1ML LIQD Place 1 spray into both nostrils once. For known/suspected opiod overdose. Every 2-3 minutes in alternating nostril till EMS arrives.     ondansetron  (ZOFRAN ) 8 MG tablet Take 1 tablet (8 mg total) by mouth every 8 (eight) hours as needed for nausea or vomiting. 30 tablet 3   oxyCODONE -acetaminophen  (PERCOCET) 10-325 MG tablet Take 1-1.5 tablets by mouth every 4 (  four) hours as needed for pain. 90 tablet 0   pioglitazone  (ACTOS ) 45 MG tablet TAKE 1 TABLET (45 MG TOTAL) BY MOUTH DAILY. FOR DIABETES. 90 tablet 1   polyethylene glycol powder (GLYCOLAX /MIRALAX ) 17 GM/SCOOP powder Take 17 g by mouth daily as needed for moderate constipation or mild constipation.     pravastatin  (PRAVACHOL ) 40 MG tablet TAKE 1 TABLET BY MOUTH EVERY DAY IN THE EVENING FOR CHOLESTEROL 90 tablet 2   PRESCRIPTION MEDICATION CPAP- At bedtime     testosterone  cypionate (DEPOTESTOSTERONE CYPIONATE) 200 MG/ML injection Inject 200 mg into the muscle every 14 (fourteen) days.     oxymetazoline  (AFRIN) 0.05 % nasal spray Place 1 spray into both nostrils 2 (two) times daily as needed.     No current facility-administered medications for this visit.   Facility-Administered Medications Ordered in Other Visits  Medication Dose Route Frequency Provider Last Rate Last Admin   heparin  lock flush 100 unit/mL  500 Units Intracatheter Once Sherrod Sherrod, MD       sodium chloride  flush (NS) 0.9 % injection 10 mL  10 mL Intracatheter Once Sherrod Sherrod, MD         Physical Exam    VS:  BP 90/64 (BP Location: Left Arm, Patient Position: Sitting, Cuff Size: Normal)   Pulse (!) 116   Ht 5' 9 (1.753 m)   Wt 224 lb 2 oz (101.7 kg)   SpO2 95%   BMI 33.10 kg/m  , BMI Body mass index is 33.1 kg/m.          GEN: Well nourished, well developed, in no acute distress. HEENT: normal. Neck: Supple, no JVD, carotid bruits, or masses. Cardiac: RRR, tachycardic, no murmurs, rubs, or gallops. No clubbing,  cyanosis, 1+ right ankle and trace left ankle edema.  Radials 2+/PT 2+ and equal bilaterally.  Respiratory:  Respirations regular and unlabored, clear to auscultation bilaterally. GI: Soft, nontender, nondistended, BS + x 4. MS: no deformity or atrophy. Skin: warm and dry, no rash. Neuro:  Strength and sensation are intact. Psych: Normal affect.  Accessory Clinical Findings    ECG personally reviewed by me today - EKG Interpretation Date/Time:  Thursday September 08 2024 11:32:27 EDT Ventricular Rate:  116 PR Interval:  190 QRS Duration:  78 QT Interval:  308 QTC Calculation: 428 R Axis:   15  Text Interpretation: Sinus tachycardia Low voltage QRS Confirmed by Vivienne Bruckner [phone]) on 09/08/2024 11:40:05 AM  - no acute changes.  Lab Results  Component Value Date   WBC 2.2 (L) 09/06/2024   HGB 9.1 (L) 09/06/2024   HCT 28.4 (L) 09/06/2024   MCV 96.3 09/06/2024   PLT 173 09/06/2024   Lab Results  Component Value Date   CREATININE 0.84 09/06/2024   BUN 28 (H) 09/06/2024   NA 137 09/06/2024   K 4.5 09/06/2024   CL 99 09/06/2024   CO2 29 09/06/2024   Lab Results  Component Value Date   ALT 21 09/06/2024   AST 24 09/06/2024   ALKPHOS 159 (H) 09/06/2024   BILITOT 0.4 09/06/2024   Lab Results  Component Value Date   CHOL 99 07/28/2024   HDL 46.00 07/28/2024   LDLCALC 34 07/28/2024   LDLDIRECT 66.0 08/13/2021   TRIG 93.0 07/28/2024   CHOLHDL 2 07/28/2024    Lab Results  Component Value Date   HGBA1C 6.7 (H) 07/28/2024   Lab Results  Component Value Date   TSH 0.500 06/26/2021  Assessment & Plan    1.  Dyspnea on exertion/HFpEF: Patient with history of dyspnea exertion and lower extremity swelling with most recent echo in August 2024 showing EF of 60-65%.  In the setting of chronic pain, his activity is very limited and he has noted some progression of dyspnea exertion.  He continues to experience mild lower extremity swelling which is largely dependent  in nature and seems to improve with elevation.  He has trace to 1+ lower extremity swelling on examination today.  He does not appear to be particularly volume overloaded on examination otherwise and his wife notes that he has been losing weight in the setting of poor appetite.  We agreed to arrange for follow-up with 2D echocardiogram.  In light of soft blood pressures, I am reducing his losartan  to 25 mg daily.  We did discuss that this may need to be discontinued altogether if pressures do not improve at a lower dose.  Strongly encouraged use of compression socks as well as leg elevation as tolerated (limited by back pain), and avoidance of sodium and processed foods.  2.  Nonobstructive coronary artery disease: Coronary CT angiogram in 2022 showed mild to moderate nonobstructive CAD.  He does not experience chest pain.  He has had some progression of baseline level of dyspnea on exertion.  Follow-up echo.  No aspirin  in the setting of Eliquis .  Continue statin therapy.  3.  Relative hypotension/hypertension: Pressure been trending in the 90s and despite this, he is continue to take losartan  50 mg daily.  We agreed to reduce losartan  to 25 mg daily and his wife is going to follow his blood pressure at home.  If pressures remain less than 100 systolic, they will let us  know but we would likely just discontinue losartan  altogether.  4.  Hyperlipidemia: On statin therapy with an LDL of 34 earlier this year.  5.  Type 2 diabetes mellitus: A1c is 6.7 in August.  He is managed by primary care.  His wife pointed out that there was some discussion about stopping Actos  and transitioning to an SGLT2 inhibitor.  His wife also indicates that he has been prone to dehydration, though creatinine has trended less than 1.  In the setting of soft blood pressures and propensity toward orthostasis and dehydration, he is not likely a good SGLT2 inhibitor candidate at this time but we discussed that if pressures improve and he  is able to ensure adequate oral intake, he would likely benefit from an SGLT2 inhibitor in the setting of HFpEF, diabetes, lower extremity swelling.  6.  Dilated thoracic aorta: Echo last year showed 41 mm aortic root with 43 mm ascending aorta.  CTA in August showed a 4 cm ascending thoracic aorta.  Blood pressure stable/soft.  7.  Metastatic lung cancer: Followed closely by hematology/oncology.  8.  Chronic pain: Now followed in pain clinic.  9.  History of PE: On chronic Eliquis .  10.  Obstructive sleep apnea: Uses CPAP.  11.  Sinus tachycardia: Patient tachycardic today and upon trend review over the past year, he is frequently hovering right around 100.  Recent CTA chest without acute findings, the patient is known to have history of PE and is on chronic Eliquis .  Following up echo.  Suspect tachycardia driven by pain and chronic illness.  12.  Disposition: Follow-up echocardiogram.  Follow-up in 3 months or sooner if necessary.  Lonni Meager, NP 09/08/2024, 11:40 AM

## 2024-09-09 DIAGNOSIS — C3411 Malignant neoplasm of upper lobe, right bronchus or lung: Secondary | ICD-10-CM | POA: Diagnosis not present

## 2024-09-09 DIAGNOSIS — I2510 Atherosclerotic heart disease of native coronary artery without angina pectoris: Secondary | ICD-10-CM | POA: Diagnosis not present

## 2024-09-09 DIAGNOSIS — E119 Type 2 diabetes mellitus without complications: Secondary | ICD-10-CM | POA: Diagnosis not present

## 2024-09-09 DIAGNOSIS — J449 Chronic obstructive pulmonary disease, unspecified: Secondary | ICD-10-CM | POA: Diagnosis not present

## 2024-09-09 DIAGNOSIS — J9611 Chronic respiratory failure with hypoxia: Secondary | ICD-10-CM | POA: Diagnosis not present

## 2024-09-09 DIAGNOSIS — I10 Essential (primary) hypertension: Secondary | ICD-10-CM | POA: Diagnosis not present

## 2024-09-12 ENCOUNTER — Ambulatory Visit: Payer: PRIVATE HEALTH INSURANCE | Admitting: Radiation Oncology

## 2024-09-12 ENCOUNTER — Ambulatory Visit: Admitting: Internal Medicine

## 2024-09-12 ENCOUNTER — Other Ambulatory Visit: Payer: Self-pay | Admitting: Nurse Practitioner

## 2024-09-12 ENCOUNTER — Ambulatory Visit

## 2024-09-12 ENCOUNTER — Telehealth: Payer: Self-pay | Admitting: Radiation Oncology

## 2024-09-12 ENCOUNTER — Inpatient Hospital Stay

## 2024-09-12 ENCOUNTER — Other Ambulatory Visit

## 2024-09-12 DIAGNOSIS — Z5111 Encounter for antineoplastic chemotherapy: Secondary | ICD-10-CM | POA: Diagnosis not present

## 2024-09-12 DIAGNOSIS — C3411 Malignant neoplasm of upper lobe, right bronchus or lung: Secondary | ICD-10-CM

## 2024-09-12 DIAGNOSIS — Z515 Encounter for palliative care: Secondary | ICD-10-CM

## 2024-09-12 DIAGNOSIS — G893 Neoplasm related pain (acute) (chronic): Secondary | ICD-10-CM | POA: Diagnosis not present

## 2024-09-12 DIAGNOSIS — D649 Anemia, unspecified: Secondary | ICD-10-CM

## 2024-09-12 DIAGNOSIS — C7951 Secondary malignant neoplasm of bone: Secondary | ICD-10-CM | POA: Diagnosis not present

## 2024-09-12 DIAGNOSIS — C787 Secondary malignant neoplasm of liver and intrahepatic bile duct: Secondary | ICD-10-CM | POA: Diagnosis not present

## 2024-09-12 DIAGNOSIS — D696 Thrombocytopenia, unspecified: Secondary | ICD-10-CM

## 2024-09-12 LAB — CBC WITH DIFFERENTIAL (CANCER CENTER ONLY)
Abs Immature Granulocytes: 0.01 K/uL (ref 0.00–0.07)
Basophils Absolute: 0 K/uL (ref 0.0–0.1)
Basophils Relative: 1 %
Eosinophils Absolute: 0 K/uL (ref 0.0–0.5)
Eosinophils Relative: 0 %
HCT: 27.6 % — ABNORMAL LOW (ref 39.0–52.0)
Hemoglobin: 8.9 g/dL — ABNORMAL LOW (ref 13.0–17.0)
Immature Granulocytes: 1 %
Lymphocytes Relative: 14 %
Lymphs Abs: 0.3 K/uL — ABNORMAL LOW (ref 0.7–4.0)
MCH: 31 pg (ref 26.0–34.0)
MCHC: 32.2 g/dL (ref 30.0–36.0)
MCV: 96.2 fL (ref 80.0–100.0)
Monocytes Absolute: 0.4 K/uL (ref 0.1–1.0)
Monocytes Relative: 22 %
Neutro Abs: 1.2 K/uL — ABNORMAL LOW (ref 1.7–7.7)
Neutrophils Relative %: 62 %
Platelet Count: 69 K/uL — ABNORMAL LOW (ref 150–400)
RBC: 2.87 MIL/uL — ABNORMAL LOW (ref 4.22–5.81)
RDW: 17.2 % — ABNORMAL HIGH (ref 11.5–15.5)
WBC Count: 2 K/uL — ABNORMAL LOW (ref 4.0–10.5)
nRBC: 0 % (ref 0.0–0.2)

## 2024-09-12 LAB — SAMPLE TO BLOOD BANK

## 2024-09-12 LAB — CMP (CANCER CENTER ONLY)
ALT: 26 U/L (ref 0–44)
AST: 28 U/L (ref 15–41)
Albumin: 3.5 g/dL (ref 3.5–5.0)
Alkaline Phosphatase: 195 U/L — ABNORMAL HIGH (ref 38–126)
Anion gap: 10 (ref 5–15)
BUN: 21 mg/dL (ref 8–23)
CO2: 28 mmol/L (ref 22–32)
Calcium: 8.6 mg/dL — ABNORMAL LOW (ref 8.9–10.3)
Chloride: 100 mmol/L (ref 98–111)
Creatinine: 0.83 mg/dL (ref 0.61–1.24)
GFR, Estimated: >60 mL/min (ref >60)
Glucose, Bld: 171 mg/dL — ABNORMAL HIGH (ref 70–99)
Potassium: 4.7 mmol/L (ref 3.5–5.1)
Sodium: 138 mmol/L (ref 135–145)
Total Bilirubin: 0.4 mg/dL (ref 0.0–1.2)
Total Protein: 6.4 g/dL — ABNORMAL LOW (ref 6.5–8.1)

## 2024-09-13 ENCOUNTER — Other Ambulatory Visit: Payer: Self-pay | Admitting: Nurse Practitioner

## 2024-09-13 ENCOUNTER — Ambulatory Visit

## 2024-09-13 ENCOUNTER — Encounter: Payer: Self-pay | Admitting: Internal Medicine

## 2024-09-13 DIAGNOSIS — C3411 Malignant neoplasm of upper lobe, right bronchus or lung: Secondary | ICD-10-CM | POA: Diagnosis not present

## 2024-09-13 DIAGNOSIS — E119 Type 2 diabetes mellitus without complications: Secondary | ICD-10-CM | POA: Diagnosis not present

## 2024-09-13 DIAGNOSIS — G893 Neoplasm related pain (acute) (chronic): Secondary | ICD-10-CM

## 2024-09-13 DIAGNOSIS — J9611 Chronic respiratory failure with hypoxia: Secondary | ICD-10-CM | POA: Diagnosis not present

## 2024-09-13 DIAGNOSIS — J449 Chronic obstructive pulmonary disease, unspecified: Secondary | ICD-10-CM | POA: Diagnosis not present

## 2024-09-13 DIAGNOSIS — I2510 Atherosclerotic heart disease of native coronary artery without angina pectoris: Secondary | ICD-10-CM | POA: Diagnosis not present

## 2024-09-13 DIAGNOSIS — I10 Essential (primary) hypertension: Secondary | ICD-10-CM | POA: Diagnosis not present

## 2024-09-13 DIAGNOSIS — Z515 Encounter for palliative care: Secondary | ICD-10-CM

## 2024-09-13 NOTE — Telephone Encounter (Signed)
 I called and spoke with the patient and his wife to review our discussion yesterday. The patient mentioned that it might be better to end his life which  I called the wife back about to discuss. She feels that he is confused and doesn't know all that is happening but that firearms are secure in the home, and I encouraged her to consider removing them from the home which she states she has done previously before his cancer diagnosis. We discussed the options of repeating an MRI since his chemo regimen can penetrate the brain and considering dexamethasone , which she thinks Adam Wise would be opposed to. He also is not willing to check his blood sugars and hasn't taken his insulin  in months. We decided to talk again tomorrow to see what her husband would be interested in but he has been adamant about not receiving whole brain radiation.

## 2024-09-14 ENCOUNTER — Encounter: Payer: Self-pay | Admitting: Radiation Oncology

## 2024-09-14 ENCOUNTER — Ambulatory Visit

## 2024-09-15 ENCOUNTER — Ambulatory Visit

## 2024-09-15 ENCOUNTER — Encounter: Payer: Self-pay | Admitting: Internal Medicine

## 2024-09-15 ENCOUNTER — Telehealth: Payer: Self-pay

## 2024-09-15 ENCOUNTER — Encounter: Payer: Self-pay | Admitting: Radiation Oncology

## 2024-09-15 ENCOUNTER — Other Ambulatory Visit (HOSPITAL_COMMUNITY): Payer: Self-pay

## 2024-09-15 ENCOUNTER — Other Ambulatory Visit: Payer: Self-pay | Admitting: Radiation Therapy

## 2024-09-15 ENCOUNTER — Telehealth: Payer: Self-pay | Admitting: Radiation Therapy

## 2024-09-15 DIAGNOSIS — C7931 Secondary malignant neoplasm of brain: Secondary | ICD-10-CM

## 2024-09-15 NOTE — Telephone Encounter (Signed)
 Patient notified through Mychart.

## 2024-09-15 NOTE — Telephone Encounter (Signed)
 Pharmacy Patient Advocate Encounter   Received notification from Pt Calls Messages that prior authorization for [facility] 3 plus sensor is required/requested.   Insurance verification completed.   The patient is insured through [facility]   Per test claim: Medication is not eligible for pharmacy benefits and must be billed through medical insurance. As our team only handles pharmacy related prior auths, medical PA's must be submitted by the clinic. Thank you  I tried both Freestyle and Dexcom G7. Both will not run through patients Medicare Part D.  For both under patients Part B I get a message that pharmacy is not contracted with plan on date of service. [facility] should be able to process under Part B Medicare. PA should not be needed.

## 2024-09-15 NOTE — Progress Notes (Signed)
 I was unable to talk to the patient yesterday but communicated via MyChart and called today to review the patient's MRI scan and recommendations for either whole brain radiation versus SRS versus repeat scan given his current regimen has CNS penetrance.  He is open to single fraction radiation but states he will not do any more radiation after this. I let him know that our standard workflow is to continue following people with surveillance every three months with 3TMRI scans. He would also need a repeat scan given that his MRI was not a three test scan to begin with.He understands the need for maskmaking and CT simulation and I've reached out to our navigator to try and coordinate.

## 2024-09-15 NOTE — Telephone Encounter (Signed)
 I left a detailed message for Mrs. Diekman. My contact information and the details of Mr. Mayall' upcoming radiation treatment planning were included.   Devere Perch R.T(R)(T) Radiation Special Procedures Lead

## 2024-09-16 ENCOUNTER — Encounter (HOSPITAL_COMMUNITY): Payer: Self-pay | Admitting: *Deleted

## 2024-09-16 ENCOUNTER — Other Ambulatory Visit: Payer: Self-pay

## 2024-09-16 ENCOUNTER — Other Ambulatory Visit: Payer: Self-pay | Admitting: Primary Care

## 2024-09-16 ENCOUNTER — Encounter: Payer: Self-pay | Admitting: Internal Medicine

## 2024-09-16 ENCOUNTER — Ambulatory Visit

## 2024-09-16 ENCOUNTER — Other Ambulatory Visit: Payer: Self-pay | Admitting: Radiation Therapy

## 2024-09-16 ENCOUNTER — Other Ambulatory Visit: Payer: Self-pay | Admitting: Radiology

## 2024-09-16 ENCOUNTER — Other Ambulatory Visit (HOSPITAL_COMMUNITY): Payer: Self-pay

## 2024-09-16 DIAGNOSIS — C3490 Malignant neoplasm of unspecified part of unspecified bronchus or lung: Secondary | ICD-10-CM | POA: Diagnosis not present

## 2024-09-16 DIAGNOSIS — G893 Neoplasm related pain (acute) (chronic): Secondary | ICD-10-CM

## 2024-09-16 DIAGNOSIS — E1165 Type 2 diabetes mellitus with hyperglycemia: Secondary | ICD-10-CM

## 2024-09-16 DIAGNOSIS — C7931 Secondary malignant neoplasm of brain: Secondary | ICD-10-CM

## 2024-09-16 NOTE — Anesthesia Preprocedure Evaluation (Addendum)
 Anesthesia Evaluation  Patient identified by MRN, date of birth, ID band Patient awake    Reviewed: Allergy & Precautions, NPO status , Patient's Chart, lab work & pertinent test results  History of Anesthesia Complications Negative for: history of anesthetic complications  Airway Mallampati: II  TM Distance: >3 FB Neck ROM: Full    Dental  (+) Teeth Intact, Dental Advisory Given   Pulmonary sleep apnea and Continuous Positive Airway Pressure Ventilation , COPD,  COPD inhaler, former smoker, PE Metastatic Small Cell Lung Cancer   Pulmonary exam normal breath sounds clear to auscultation       Cardiovascular hypertension, Pt. on medications + CAD  Normal cardiovascular exam Rhythm:Regular Rate:Normal  TTE 2024 1. Technically difficult study. Left ventricular ejection fraction, by  estimation, is 60 to 65%. The left ventricle has normal function. The left  ventricle has no regional wall motion abnormalities. There is mild left  ventricular hypertrophy. Left  ventricular diastolic parameters are indeterminate.   2. Right ventricular systolic function was not well visualized. The right  ventricular size is not well visualized.   3. The mitral valve is grossly normal. No evidence of mitral valve  regurgitation.   4. The aortic valve was not well visualized. Aortic valve regurgitation  is not visualized. No aortic stenosis is present.   5. Aortic dilatation noted. There is dilatation of the aortic root,  measuring 41 mm. There is dilatation of the ascending aorta, measuring 43  mm.     Neuro/Psych   Anxiety Depression       GI/Hepatic ,GERD  Medicated,,(+)     substance abuse (MS contin , percocet)    Endo/Other  diabetes, Type 2, Oral Hypoglycemic Agents    Renal/GU negative Renal ROS     Musculoskeletal  (+) Arthritis ,  narcotic dependent  Abdominal   Peds  Hematology  (+) Blood dyscrasia (eliquis ), anemia Lab  Results      Component                Value               Date                      WBC                      2.2 (L)             09/19/2024                HGB                      7.6 (L)             09/19/2024                HCT                      24.3 (L)            09/19/2024                MCV                      100.0               09/19/2024                PLT  143 (L)             09/19/2024              Anesthesia Other Findings   Reproductive/Obstetrics                              Anesthesia Physical Anesthesia Plan  ASA: 3  Anesthesia Plan: General   Post-op Pain Management: Minimal or no pain anticipated   Induction:   PONV Risk Score and Plan: 2 and Treatment may vary due to age or medical condition and Ondansetron   Airway Management Planned: Oral ETT  Additional Equipment: None  Intra-op Plan:   Post-operative Plan: Extubation in OR  Informed Consent: I have reviewed the patients History and Physical, chart, labs and discussed the procedure including the risks, benefits and alternatives for the proposed anesthesia with the patient or authorized representative who has indicated his/her understanding and acceptance.     Dental advisory given  Plan Discussed with:   Anesthesia Plan Comments: (PAT note by Lynwood Hope, PA-C: 73 year old male with pertinent history including metastatic lung cancer w/ liver, bone, and brain metastases, COPD, nonobstructive CAD, HTN, HL, DM2 (A1c 6.7 on 07/28/2024), aortic atherosclerosis, dilated aorta, OSA on CPAP, pulmonary hypertension, recurrent pleural effusions, pulmonary embolism on Eliquis , chronic pain, anemia, severe lumbar stenosis and GERD on PPI.  Coronary CTA 11/27/2021 showed mild mixed nonobstructive CAD, dilated main pulmonary artery 37 mm suggestive of pulm hypertension.  Echo 08/12/2023 showed LVEF 60 to 65%, RV not well-visualized, no significant valvular  abnormalities, mildly dilated aortic root 41 mm, dilatation ascending aorta 43 mm.  CTA of the chest 08/13/2024 again demonstrated enlarged main pulmonary artery 38 mm and stable ascending thoracic aorta 40 mm.  Last seen in cardiology follow-up on 09/08/2024 by Lonni Meager, NP on 05/08/2024.  Noted to have soft blood pressures at that time losartan  was reduced from 50 mg daily to 25 mg daily.  He was instructed to monitor blood pressure at home and if persistently <100 systolic consider discontinuing losartan  completely.  Patient reports last dose Eliquis  09/15/24.  CMP and CBC 09/12/2024 reviewed, chronic leukocytopenia with WBC 2.0, chronic moderate anemia with hemoglobin 8.9, otherwise unremarkable.  Patient will need day of surgery evaluation.  EKG 09/08/2024: Sinus tachycardia.  Rate 116.  Low voltage QRS.  TTE 08/12/2023: 1. Technically difficult study. Left ventricular ejection fraction, by  estimation, is 60 to 65%. The left ventricle has normal function. The left  ventricle has no regional wall motion abnormalities. There is mild left  ventricular hypertrophy. Left  ventricular diastolic parameters are indeterminate.  2. Right ventricular systolic function was not well visualized. The right  ventricular size is not well visualized.  3. The mitral valve is grossly normal. No evidence of mitral valve  regurgitation.  4. The aortic valve was not well visualized. Aortic valve regurgitation  is not visualized. No aortic stenosis is present.  5. Aortic dilatation noted. There is dilatation of the aortic root,  measuring 41 mm. There is dilatation of the ascending aorta, measuring 43  mm.   Coronary CTA 11/27/2021: IMPRESSION: 1. 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  2. Coronary calcium score of 657. This was 77th percentile for age and sex matched control.  3. Normal coronary origin with right dominance.  4. Dilated main  pulmonary artery at 37 mm, suggestive of pulmonary hypertension.  5. Catheter noted in the  SVC.  6. Aortic atherosclerosis.  )         Anesthesia Quick Evaluation

## 2024-09-16 NOTE — Progress Notes (Signed)
 For Anesthesia: PCP - [name], NP  Cardiologist - Anner Alm ORN, MD   Bowel Prep reminder:  Chest x-ray -  EKG - 09/08/24 Stress Test -  ECHO - 08/11/24 Cardiac Cath -  Pacemaker/ICD device last checked: Pacemaker orders received: Device Rep notified:  Spinal Cord Stimulator:N/A  Sleep Study - Yes CPAP - Yes  Fasting Blood Sugar - N/A Checks Blood Sugar __0___ times a day Date and result of last Hgb A1c-  Last dose of GLP1 agonist- N/A GLP1 instructions: Hold 7 days prior to schedule (Hold 24 hours-daily)   Last dose of SGLT-2 inhibitors- N/A SGLT-2 instructions: Hold 72 hours prior to surgery  Blood Thinner Instructions: Eliquis . Last dose: 09/15/24 Last Dose: Time last taken:  Aspirin  Instructions: Last Dose: Time last taken:  Activity level:    Unable to go up a flight of stairs without chest pain and/or shortness of breath    Anesthesia review: Hx: HTN,COPD,DIA,OSA(CPAP)  Patient denies shortness of breath, fever, cough and chest pain at PAT appointment   Patient verbalized understanding of instructions that were reviewed over the telephone.

## 2024-09-16 NOTE — Telephone Encounter (Signed)
 Pharmacy Patient Advocate Encounter   Received notification from Pt Calls Messages that prior authorization for [facility] 3 plus sensor is required/requested.   Insurance verification completed.   The patient is insured through [employer].   Called [employer] to process under Part B and it was requiring a Prescriber verififcation per [facility]. Tried to submit a PA request an d got this message:

## 2024-09-16 NOTE — Progress Notes (Signed)
 Anesthesia Chart Review: Same day workup  73 year old male with pertinent history including metastatic lung cancer w/ liver, bone, and brain metastases, COPD, nonobstructive CAD, HTN, HL, DM2 (A1c 6.7 on 07/28/2024), aortic atherosclerosis, dilated aorta, OSA on CPAP, pulmonary hypertension, recurrent pleural effusions, pulmonary embolism on Eliquis , chronic pain, anemia, severe lumbar stenosis and GERD on PPI.  Coronary CTA 11/27/2021 showed mild mixed nonobstructive CAD, dilated main pulmonary artery 37 mm suggestive of pulm hypertension.  Echo 08/12/2023 showed LVEF 60 to 65%, RV not well-visualized, no significant valvular abnormalities, mildly dilated aortic root 41 mm, dilatation ascending aorta 43 mm.  CTA of the chest 08/13/2024 again demonstrated enlarged main pulmonary artery 38 mm and stable ascending thoracic aorta 40 mm.  Last seen in cardiology follow-up on 09/08/2024 by Lonni Meager, NP on 05/08/2024.  Noted to have soft blood pressures at that time losartan  was reduced from 50 mg daily to 25 mg daily.  He was instructed to monitor blood pressure at home and if persistently <100 systolic consider discontinuing losartan  completely.  Patient reports last dose Eliquis  09/15/24.  CMP and CBC 09/12/2024 reviewed, chronic leukocytopenia with WBC 2.0, chronic moderate anemia with hemoglobin 8.9, otherwise unremarkable.  Patient will need day of surgery evaluation.  EKG 09/08/2024: Sinus tachycardia.  Rate 116.  Low voltage QRS.  TTE 08/12/2023: 1. Technically difficult study. Left ventricular ejection fraction, by  estimation, is 60 to 65%. The left ventricle has normal function. The left  ventricle has no regional wall motion abnormalities. There is mild left  ventricular hypertrophy. Left  ventricular diastolic parameters are indeterminate.   2. Right ventricular systolic function was not well visualized. The right  ventricular size is not well visualized.   3. The mitral valve is  grossly normal. No evidence of mitral valve  regurgitation.   4. The aortic valve was not well visualized. Aortic valve regurgitation  is not visualized. No aortic stenosis is present.   5. Aortic dilatation noted. There is dilatation of the aortic root,  measuring 41 mm. There is dilatation of the ascending aorta, measuring 43  mm.   Coronary CTA 11/27/2021: IMPRESSION: 1. 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   2. Coronary calcium score of 657. This was 77th percentile for age and sex matched control.   3. Normal coronary origin with right dominance.   4. Dilated main pulmonary artery at 37 mm, suggestive of pulmonary hypertension.   5. Catheter noted in the SVC.   6. Aortic atherosclerosis.     [place], [facility]/Anesthesiology Phone [phone] 09/16/2024 1:08 PM

## 2024-09-16 NOTE — H&P (Addendum)
 Chief Complaint: Metastatic lung cancer pain  Referring Provider(s): Fannie Borer, NP  Supervising Physician: Johann Sieving  Patient Status: WL - outpt  History of Present Illness: Adam Wise is a 73 y.o. male with history significant of metastatic lung cancer, T2DM, HTN, PE on Eliquis , GERD, HLD, COPD and chronic back pain. Pt has had multiple hospitalizations due to pain exacerbations. He is on multiple narcotic PO pain medications and a 5% lidocaine  patch in attempt to control his back pain. Previous epidural steroid injections have provided minimal relief of pain. Recent hospitalization 07/2024 concerning for cardiac arrhythmia and pt followed up with cardiology who reported it was representing of [place] 1. Pt returned to ED 08/13/2024 for hematuria and weakness. ECG reported unremarkable at that time.   Pt continues to have uncontrolled back  pain. Request for intrathecal pump placement by Interventional Radiology.  Hematology/oncology, palliative care, and cardiology following.   On Eliquis . Last dose 09/15/24.     Allergies Reviewed:  Bupropion, Hydrochlorothiazide, and Lisinopril     Patient is Full Code  Past Medical History:  Diagnosis Date   Acute on chronic respiratory failure with hypoxia (HCC) 10/12/2021   Anxiety    Arthritis    Chickenpox    Chronic knee pain    Chronic low back pain    COPD exacerbation (HCC) 10/11/2021   COPD with exacerbation (HCC) 10/12/2021   Coronary artery calcification seen on CAT scan 11/2021   Coronary CTA 11/27/2021: Moderate to large right pleural effusion and compressive atelectasis right lung base. => Coronary Calcium Score 657.  Diffuse RCA calcification.  Minimal mild disease in the LAD and diagonal branches. == Overall limited study.  Notable artifact.   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 He has disease progression after the first line of chemotherapy in December 2021.   Testosterone  deficiency    Type 2 diabetes mellitus (HCC)     Past Surgical History:  Procedure Laterality Date   CHEST TUBE INSERTION Right 01/20/2022   Procedure: CHEST TUBE INSERTION;  Surgeon: Claudene Sieving BROCKS, MD;  Location: [facility] ENDOSCOPY;  Service: Pulmonary;  Laterality: Right;  w/ Talc  Pleurodesis, planned admit for Obs afterwards   CHEST TUBE INSERTION Right 04/28/2022   Procedure: INSERTION PLEURAL DRAINAGE CATHETER - Pigtail tail, drainage;  Surgeon: Claudene Sieving BROCKS, MD;  Location: [facility] ENDOSCOPY;  Service: Pulmonary;  Laterality: Right;  talc  pleurodesis   CHEST TUBE INSERTION Right 09/01/2022   Procedure: INSERTION PLEURAL DRAINAGE CATHETER;  Surgeon: Alaine Vicenta NOVAK, MD;  Location: [facility] ENDOSCOPY;  Service: Cardiopulmonary;  Laterality: Right;  pigtail catheter placement with talc  pleurodesis   COLONOSCOPY WITH PROPOFOL  N/A 12/17/2018   Procedure: COLONOSCOPY WITH PROPOFOL ;  Surgeon: Janalyn Keene NOVAK, MD;  Location: [facility] ENDOSCOPY;  Service: Gastroenterology;  Laterality: N/A;   IR IMAGING GUIDED PORT INSERTION  04/17/2020   IR THORACENTESIS ASP PLEURAL SPACE W/IMG GUIDE  11/29/2021   IR THORACENTESIS ASP PLEURAL SPACE W/IMG GUIDE  01/03/2022   JOINT REPLACEMENT Bilateral    REPLACEMENT  TOTAL KNEE BILATERAL  2015   TALC  PLEURODESIS  09/01/2022   Procedure: TALC  PLEURADESIS;  Surgeon: Alaine Vicenta NOVAK, MD;  Location: [facility] ENDOSCOPY;  Service: Cardiopulmonary;;   TONSILLECTOMY  1960   TRANSTHORACIC ECHOCARDIOGRAM   05/2020   a) 05/2020: EF 55 to 60%.  No R WMA.  Mild LVH.  Indeterminate LVEDP.  Unable to assess RVP.  Normal aortic and mitral valves.  Mildly elevated RAP.; b) 09/2021: EF 50-55%. No RWMA. Mild LVH. ~ LVEDP. Mild LA Dilation. NORMAL RV/RAP.  Normal MV/AoV.      Medications: Prior to Admission medications   Medication Sig Start Date End Date Taking? Authorizing Provider  albuterol  (VENTOLIN  HFA) 108 (90 Base) MCG/ACT inhaler Inhale 2 puffs into the lungs every 4 (four) hours as needed for shortness of breath. 05/30/24  Yes Clark, Katherine K, NP  Black Pepper-Turmeric (TURMERIC COMPLEX/BLACK PEPPER PO) Take 1 tablet by mouth in the morning and at bedtime.   Yes [provider]  Calcium Carb-Cholecalciferol (CALCIUM PLUS VITAMIN D3 PO) Take 1 tablet by mouth in the morning and at bedtime.   Yes [provider]  Coenzyme Q10 (COQ10) 200 MG CAPS Take 200 mg by mouth daily.   Yes [provider]  cyclobenzaprine  (FLEXERIL ) 10 MG tablet Take 1 tablet (10 mg total) by mouth 2 (two) times daily as needed for muscle spasms. 07/29/24  Yes Curatolo, Adam, DO  docusate sodium  (COLACE) 100 MG capsule Take 1 capsule (100 mg total) by mouth every 12 (twelve) hours. Patient taking differently: Take 100 mg by mouth daily as needed for moderate constipation. 08/02/23  Yes Mannie Pac T, DO  ELIQUIS  5 MG TABS tablet TAKE 1 TABLET BY MOUTH TWICE A DAY 12/06/23  Yes Heilingoetter, Cassandra L, PA-C  esomeprazole (NEXIUM) 20 MG capsule Take 20 mg by mouth in the morning and at bedtime.   Yes [provider]  gabapentin  (NEURONTIN ) 300 MG capsule Take 1 capsule (300 mg total) by mouth 2 (two) times daily. For back pain. 08/10/24  Yes Pickenpack-Cousar, Fannie SAILOR, NP  ipratropium-albuterol  (DUONEB) 0.5-2.5 (3) MG/3ML SOLN Take 3 mLs by nebulization every 6 (six) hours as needed (SOB/Wheezing).   Yes [provider]  KRILL OIL PO Take 1 capsule by mouth daily.   Yes [provider]  lidocaine  (LIDODERM ) 5 % Place 1 patch onto the skin daily. Remove & Discard patch within 12 hours or as directed by MD 09/06/24  Yes Pickenpack-Cousar, Fannie SAILOR, NP  losartan  (COZAAR ) 25 MG tablet Take 1 tablet (25 mg total) by mouth daily. For blood pressure. 09/08/24  Yes Vivienne Lonni Ingle, NP  metFORMIN  (GLUCOPHAGE ) 1000 MG tablet TAKE 1 TABLET (1,000 MG TOTAL) BY MOUTH 2 (TWO) TIMES DAILY WITH A MEAL. FOR DIABETES 07/15/24  Yes [name], NP  mirtazapine  (REMERON ) 15 MG tablet TAKE 1 TABLET BY MOUTH EVERYDAY AT BEDTIME 02/28/24  Yes Heilingoetter, Cassandra L, PA-C  morphine  (MS CONTIN ) 30 MG 12 hr tablet Take 1 tablet (30 mg total) by mouth every 8 (eight) hours as needed. Patient taking differently: Take 30 mg by mouth every 6 (six) hours as needed for pain. 09/09/24  Yes Pickenpack-Cousar, Fannie SAILOR, NP  Multiple Vitamin (MULTI-VITAMINS) TABS Take 1 tablet by mouth daily with breakfast.   Yes [provider]  Naloxone  HCl 3 MG/0.1ML LIQD Place 1 spray into both nostrils once. For known/suspected opiod overdose. Every 2-3 minutes in alternating nostril till EMS arrives. 10/06/23  Yes [provider]  ondansetron  (ZOFRAN ) 8 MG tablet Take 1 tablet (8 mg total) by mouth every 8 (eight) hours as needed for nausea or vomiting. 07/11/24  Yes Heilingoetter, Cassandra L, PA-C  oxyCODONE -acetaminophen  (PERCOCET) 10-325 MG tablet Take 1-1.5 tablets by mouth every 4 (four) hours as needed for pain. 09/06/24  Yes Pickenpack-Cousar, Fannie SAILOR, NP  pioglitazone  (ACTOS ) 45 MG tablet TAKE 1 TABLET (45 MG TOTAL) BY MOUTH DAILY. FOR DIABETES. 08/17/24  Yes Clark, Katherine K, NP  polyethylene glycol powder (GLYCOLAX /MIRALAX ) 17 GM/SCOOP powder Take 17 g by mouth daily as needed for moderate constipation or mild constipation.   Yes [provider]  pravastatin  (PRAVACHOL ) 40 MG tablet TAKE 1 TABLET BY MOUTH EVERY DAY IN THE EVENING FOR CHOLESTEROL 08/17/24  Yes [name], NP  PRESCRIPTION MEDICATION CPAP- At bedtime   Yes [provider]  testosterone  cypionate (DEPOTESTOSTERONE CYPIONATE) 200 MG/ML injection Inject 200 mg into the muscle every 14 (fourteen) days.   Yes [provider]  B-D 3CC LUER-LOK SYR 22GX1 22G X 1 3 ML MISC USE AS INSTRUCTED FOR TESTOSTERONE  INJECTION EVERY 2 WEEKS 03/20/21   [name], NP  blood glucose meter kit and supplies KIT Dispense based on patient and insurance preference. Use up to four times daily as directed. 08/25/24   Sherrod Sherrod, MD  Continuous Glucose Sensor (FREESTYLE LIBRE 3 PLUS SENSOR) MISC Use to check blood sugar continuously. Change sensor every 15 days. 08/26/24   [name], NP  glucose blood (ONETOUCH ULTRA) test strip USE UP TO 4 TIMES DAILY AS DIRECTED 07/29/22   [name], NP  Insulin  Pen Needle (BD PEN NEEDLE NANO U/F) 32G X 4 MM MISC Use with insulin  as prescribed Dx Code: E11.9 05/20/23   [name], NP  Lancets MISC USE UP TO 3 TIMES DAILY AS DIRECTED 05/06/18   [name], NP     Family History  Problem Relation Age of Onset   Heart attack Mother    Cancer Mother    Hypertension Mother    Arthritis Father    Asthma Father    Cancer Father    COPD Father    Heart attack Father    Hypertension Sister    Cancer Sister    Diabetes Sister    Asthma Sister    Cancer Sister    COPD Sister    Arthritis Sister    Asthma Sister    Diabetes Sister    Birth defects Maternal Grandfather    Arthritis Paternal Grandmother    Diabetes Paternal Grandmother    Arthritis Paternal Grandfather    Asthma Son    Other Neg Hx        pituitary disorder    Social History   Socioeconomic History   Marital status: Married    Spouse name: Not on file   Number of children: Not on file   Years of education: Not on file   Highest education level: 12th grade  Occupational History   Not on file  Tobacco Use   Smoking status: Former    Smokeless tobacco: Never  Vaping Use   Vaping status: Never Used  Substance and Sexual Activity   Alcohol use: Not Currently    Comment: rarely   Drug use: Not Currently   Sexual activity: Yes  Other Topics Concern   Not on file  Social History Narrative   Married.   Moved from [place].   Retired.   Social Drivers of Health  Financial Resource Strain: Low Risk  (08/23/2024)   Received from [facility]   Overall Financial Resource Strain (CARDIA)    How hard is it for you to pay for the very basics like food, housing, medical care, and heating?: Not hard at all  Food Insecurity: No Food Insecurity (08/23/2024)   Received from [facility]   Hunger Vital Sign    Within the past 12 months, you worried that your food would run out before you got the money to buy more.: Never true    Within the past 12 months, the food you bought just didn't last and you didn't have money to get more.: Never true  Transportation Needs: No Transportation Needs (08/23/2024)   Received from [facility] - Transportation    In the past 12 months, has lack of transportation kept you from medical appointments or from getting medications?: No    In the past 12 months, has lack of transportation kept you from meetings, work, or from getting things needed for daily living?: No  Physical Activity: Inactive (08/23/2024)   Received from [facility]   Exercise Vital Sign    On average, how many days per week do you engage in moderate to strenuous exercise (like a brisk walk)?: 0 days    Minutes of Exercise per Session: Not on file  Stress: No Stress Concern Present (08/23/2024)   Received from [facility] - Occupational Stress Questionnaire    Do you feel stress - tense, restless, nervous, or anxious, or unable to sleep at night because your mind is troubled all the time - these days?: Not at all  Social Connections: Socially Integrated (08/23/2024)   Received from  [facility]   Social Network    How would you rate your social network (family, work, friends)?: Good participation with social networks       Review of Systems: denies fever,HA,CP,dyspnea, N/V or bleeding; has abd/back pain  Vital Signs: Vitals:   09/19/24 0653  BP: 117/71  Pulse: (!) 123  Resp: 18  Temp: 98.8 F (37.1 C)  SpO2: 92%    Ht 5' 9 (1.753 m)   Wt 224 lb 13.9 oz (102 kg)   BMI 33.21 kg/m   Advance Care Plan: no documents on file    Physical Exam: awake but sl drowsy; chest- sl dim BS bases; heart- tachy but reg rhythm; abd-soft,+BS, some gen tenderness to palpation; left> right LE edema  Imaging: MR Brain W Wo Contrast Result Date: 09/07/2024 CLINICAL DATA:  Small-cell lung cancer EXAM: MRI HEAD WITHOUT AND WITH CONTRAST TECHNIQUE: Multiplanar, multiecho pulse sequences of the brain and surrounding structures were obtained without and with intravenous contrast. CONTRAST:  10mL GADAVIST  GADOBUTROL  1 MMOL/ML IV SOLN COMPARISON:  May 16, 2024 FINDINGS: MRI brain: Again noted are foci of restricted diffusion in the right inferior cerebellum and left medial parietal lobe. These are larger than on the prior study and have developed T2 hyperintensity and z both lesions measure approximate 1 cm. Enhancement. No other lesions are identified. There is no acute or chronic infarct. The ventricles are normal. There are normal flow signals in the carotid arteries and basilar artery. No significant bone marrow signal abnormality. No significant abnormality in the paranasal sinuses or soft tissues. IMPRESSION: Lesions noted in the right inferior cerebellum and medial left parietal lobe are larger, now each measuring about 1 cm. They have developed enhancement consistent with metastases. There are no  new lesions. Electronically Signed   By: Nancyann Tiemann M.D.   On: 09/07/2024 10:13    Labs:  CBC: Recent Labs    08/22/24 1151 08/30/24 0819 09/06/24 1254 09/12/24 1315  WBC  1.6* 2.6* 2.2* 2.0*  HGB 8.4* 8.2* 9.1* 8.9*  HCT 26.4* 25.8* 28.4* 27.6*  PLT 48* 248 173 69*    COAGS: Recent Labs    08/13/24 1615  INR 1.0    BMP: Recent Labs    08/22/24 1151 08/30/24 0819 09/06/24 1254 09/12/24 1315  NA 139 136 137 138  K 4.7 4.0 4.5 4.7  CL 101 100 99 100  CO2 30 29 29 28   GLUCOSE 183* 164* 164* 171*  BUN 23 21 28* 21  CALCIUM 8.7* 8.6* 8.8* 8.6*  CREATININE 0.80 0.79 0.84 0.83  GFRNONAA >60 >60 >60 >60    LIVER FUNCTION TESTS: Recent Labs    08/22/24 1151 08/30/24 0819 09/06/24 1254 09/12/24 1315  BILITOT 0.4 0.5 0.4 0.4  AST 31 21 24 28   ALT 36 18 21 26   ALKPHOS 227* 173* 159* 195*  PROT 6.2* 5.9* 6.2* 6.4*  ALBUMIN 3.4* 3.2* 3.4* 3.5    TUMOR MARKERS: No results for input(s): AFPTM, CEA, CA199, CHROMGRNA in the last 8760 hours.  Assessment and Plan: Metastatic lung cancer pain(primarily mid to low back) Pt experiencing uncontrolled pain due to metastatic cancer despite multiple narcotic PO pain medications and 5% Lidocaine  patch. Request for image guided intrathecal pump placement by Interventional Radiology with anesthesia assistance. Case  has been reviewed by Dr. Johann.Risks and benefits of procedue was discussed with the patient /spouse including, but not limited to bleeding, infection, damage to adjacent structures , inability to eradicate pain, anesthesia related complications.   All of the questions were answered and there is agreement to proceed.  Consent signed and in chart.  Hgb 7.6 today- Dr. Sherrod aware; will plan to see pt/transfuse later this week           Thank you for allowing our service to participate in Adam Wise 's care.    Electronically Signed: Kristi B Davenport, NP Gabino Kelsie RIGGERS  09/16/2024, 3:25 PM     I spent a total of  25 minutes   in face to face in clinical consultation, greater than 50% of which was counseling/coordinating care for metastatic lung cancer  pain.   (A copy of this note was sent to the referring provider and the time of visit.)

## 2024-09-17 DIAGNOSIS — E119 Type 2 diabetes mellitus without complications: Secondary | ICD-10-CM | POA: Diagnosis not present

## 2024-09-17 DIAGNOSIS — I10 Essential (primary) hypertension: Secondary | ICD-10-CM | POA: Diagnosis not present

## 2024-09-17 DIAGNOSIS — K219 Gastro-esophageal reflux disease without esophagitis: Secondary | ICD-10-CM | POA: Diagnosis not present

## 2024-09-17 DIAGNOSIS — E785 Hyperlipidemia, unspecified: Secondary | ICD-10-CM | POA: Diagnosis not present

## 2024-09-17 DIAGNOSIS — J449 Chronic obstructive pulmonary disease, unspecified: Secondary | ICD-10-CM | POA: Diagnosis not present

## 2024-09-17 DIAGNOSIS — M48061 Spinal stenosis, lumbar region without neurogenic claudication: Secondary | ICD-10-CM | POA: Diagnosis not present

## 2024-09-17 DIAGNOSIS — Z7901 Long term (current) use of anticoagulants: Secondary | ICD-10-CM | POA: Diagnosis not present

## 2024-09-17 DIAGNOSIS — E039 Hypothyroidism, unspecified: Secondary | ICD-10-CM | POA: Diagnosis not present

## 2024-09-17 DIAGNOSIS — Z7984 Long term (current) use of oral hypoglycemic drugs: Secondary | ICD-10-CM | POA: Diagnosis not present

## 2024-09-17 DIAGNOSIS — K769 Liver disease, unspecified: Secondary | ICD-10-CM | POA: Diagnosis not present

## 2024-09-17 DIAGNOSIS — Z9089 Acquired absence of other organs: Secondary | ICD-10-CM | POA: Diagnosis not present

## 2024-09-17 DIAGNOSIS — I2510 Atherosclerotic heart disease of native coronary artery without angina pectoris: Secondary | ICD-10-CM | POA: Diagnosis not present

## 2024-09-17 DIAGNOSIS — Z96653 Presence of artificial knee joint, bilateral: Secondary | ICD-10-CM | POA: Diagnosis not present

## 2024-09-17 DIAGNOSIS — C3411 Malignant neoplasm of upper lobe, right bronchus or lung: Secondary | ICD-10-CM | POA: Diagnosis not present

## 2024-09-17 DIAGNOSIS — G4733 Obstructive sleep apnea (adult) (pediatric): Secondary | ICD-10-CM | POA: Diagnosis not present

## 2024-09-17 DIAGNOSIS — J9611 Chronic respiratory failure with hypoxia: Secondary | ICD-10-CM | POA: Diagnosis not present

## 2024-09-17 DIAGNOSIS — G8929 Other chronic pain: Secondary | ICD-10-CM | POA: Diagnosis not present

## 2024-09-19 ENCOUNTER — Ambulatory Visit (HOSPITAL_COMMUNITY): Payer: Self-pay | Admitting: Anesthesiology

## 2024-09-19 ENCOUNTER — Other Ambulatory Visit: Payer: Self-pay | Admitting: Radiation Therapy

## 2024-09-19 ENCOUNTER — Telehealth: Payer: Self-pay | Admitting: Medical Oncology

## 2024-09-19 ENCOUNTER — Other Ambulatory Visit: Payer: Self-pay

## 2024-09-19 ENCOUNTER — Ambulatory Visit (HOSPITAL_COMMUNITY)

## 2024-09-19 ENCOUNTER — Encounter (HOSPITAL_COMMUNITY): Payer: Self-pay | Admitting: Internal Medicine

## 2024-09-19 ENCOUNTER — Ambulatory Visit (HOSPITAL_COMMUNITY)
Admission: RE | Admit: 2024-09-19 | Discharge: 2024-09-19 | Disposition: A | Discharge status: Home / Self Care | Attending: Nurse Practitioner | Admitting: Nurse Practitioner

## 2024-09-19 ENCOUNTER — Encounter (HOSPITAL_COMMUNITY)
Admission: RE | Disposition: A | Discharge status: Home / Self Care | Payer: Self-pay | Source: Home / Self Care | Attending: Nurse Practitioner

## 2024-09-19 ENCOUNTER — Telehealth: Payer: Self-pay | Admitting: Radiation Therapy

## 2024-09-19 DIAGNOSIS — K219 Gastro-esophageal reflux disease without esophagitis: Secondary | ICD-10-CM | POA: Insufficient documentation

## 2024-09-19 DIAGNOSIS — Z87891 Personal history of nicotine dependence: Secondary | ICD-10-CM

## 2024-09-19 DIAGNOSIS — E119 Type 2 diabetes mellitus without complications: Secondary | ICD-10-CM | POA: Insufficient documentation

## 2024-09-19 DIAGNOSIS — J449 Chronic obstructive pulmonary disease, unspecified: Secondary | ICD-10-CM | POA: Insufficient documentation

## 2024-09-19 DIAGNOSIS — I2510 Atherosclerotic heart disease of native coronary artery without angina pectoris: Secondary | ICD-10-CM | POA: Diagnosis not present

## 2024-09-19 DIAGNOSIS — Z7901 Long term (current) use of anticoagulants: Secondary | ICD-10-CM | POA: Insufficient documentation

## 2024-09-19 DIAGNOSIS — C3411 Malignant neoplasm of upper lobe, right bronchus or lung: Secondary | ICD-10-CM | POA: Insufficient documentation

## 2024-09-19 DIAGNOSIS — Z7984 Long term (current) use of oral hypoglycemic drugs: Secondary | ICD-10-CM | POA: Diagnosis not present

## 2024-09-19 DIAGNOSIS — G893 Neoplasm related pain (acute) (chronic): Secondary | ICD-10-CM | POA: Insufficient documentation

## 2024-09-19 DIAGNOSIS — G4733 Obstructive sleep apnea (adult) (pediatric): Secondary | ICD-10-CM | POA: Diagnosis not present

## 2024-09-19 DIAGNOSIS — Z515 Encounter for palliative care: Secondary | ICD-10-CM

## 2024-09-19 DIAGNOSIS — I700 Atherosclerosis of aorta: Secondary | ICD-10-CM | POA: Diagnosis not present

## 2024-09-19 DIAGNOSIS — C7931 Secondary malignant neoplasm of brain: Secondary | ICD-10-CM

## 2024-09-19 DIAGNOSIS — C799 Secondary malignant neoplasm of unspecified site: Secondary | ICD-10-CM | POA: Diagnosis not present

## 2024-09-19 DIAGNOSIS — K922 Gastrointestinal hemorrhage, unspecified: Secondary | ICD-10-CM | POA: Diagnosis not present

## 2024-09-19 DIAGNOSIS — Z86711 Personal history of pulmonary embolism: Secondary | ICD-10-CM | POA: Insufficient documentation

## 2024-09-19 DIAGNOSIS — C3491 Malignant neoplasm of unspecified part of right bronchus or lung: Secondary | ICD-10-CM | POA: Diagnosis not present

## 2024-09-19 DIAGNOSIS — Z794 Long term (current) use of insulin: Secondary | ICD-10-CM | POA: Diagnosis not present

## 2024-09-19 DIAGNOSIS — M549 Dorsalgia, unspecified: Secondary | ICD-10-CM | POA: Diagnosis not present

## 2024-09-19 DIAGNOSIS — I10 Essential (primary) hypertension: Secondary | ICD-10-CM | POA: Diagnosis not present

## 2024-09-19 HISTORY — DX: Anxiety disorder, unspecified: F41.9

## 2024-09-19 HISTORY — DX: Depression, unspecified: F32.A

## 2024-09-19 HISTORY — DX: Unspecified osteoarthritis, unspecified site: M19.90

## 2024-09-19 LAB — COMPREHENSIVE METABOLIC PANEL WITH GFR
ALT: 25 U/L (ref 0–44)
AST: 32 U/L (ref 15–41)
Albumin: 3.4 g/dL — ABNORMAL LOW (ref 3.5–5.0)
Alkaline Phosphatase: 198 U/L — ABNORMAL HIGH (ref 38–126)
Anion gap: 10 (ref 5–15)
BUN: 18 mg/dL (ref 8–23)
CO2: 27 mmol/L (ref 22–32)
Calcium: 8.9 mg/dL (ref 8.9–10.3)
Chloride: 98 mmol/L (ref 98–111)
Creatinine, Ser: 0.71 mg/dL (ref 0.61–1.24)
GFR, Estimated: >60 mL/min (ref >60)
Glucose, Bld: 158 mg/dL — ABNORMAL HIGH (ref 70–99)
Potassium: 3.8 mmol/L (ref 3.5–5.1)
Sodium: 135 mmol/L (ref 135–145)
Total Bilirubin: 0.4 mg/dL (ref 0.0–1.2)
Total Protein: 5.7 g/dL — ABNORMAL LOW (ref 6.5–8.1)

## 2024-09-19 LAB — GLUCOSE, CAPILLARY
Glucose-Capillary: 160 mg/dL — ABNORMAL HIGH (ref 70–99)
Glucose-Capillary: 144 mg/dL — ABNORMAL HIGH (ref 70–99)
Glucose-Capillary: 138 mg/dL — ABNORMAL HIGH (ref 70–99)

## 2024-09-19 LAB — CBC WITH DIFFERENTIAL/PLATELET
Abs Immature Granulocytes: 0.03 K/uL (ref 0.00–0.07)
Basophils Absolute: 0 K/uL (ref 0.0–0.1)
Basophils Relative: 1 %
Eosinophils Absolute: 0 K/uL (ref 0.0–0.5)
Eosinophils Relative: 1 %
HCT: 24.3 % — ABNORMAL LOW (ref 39.0–52.0)
Hemoglobin: 7.6 g/dL — ABNORMAL LOW (ref 13.0–17.0)
Immature Granulocytes: 1 %
Lymphocytes Relative: 9 %
Lymphs Abs: 0.2 K/uL — ABNORMAL LOW (ref 0.7–4.0)
MCH: 31.3 pg (ref 26.0–34.0)
MCHC: 31.3 g/dL (ref 30.0–36.0)
MCV: 100 fL (ref 80.0–100.0)
Monocytes Absolute: 0.5 K/uL (ref 0.1–1.0)
Monocytes Relative: 24 %
Neutro Abs: 1.4 K/uL — ABNORMAL LOW (ref 1.7–7.7)
Neutrophils Relative %: 64 %
Platelets: 143 K/uL — ABNORMAL LOW (ref 150–400)
RBC: 2.43 MIL/uL — ABNORMAL LOW (ref 4.22–5.81)
RDW: 18.4 % — ABNORMAL HIGH (ref 11.5–15.5)
WBC: 2.2 K/uL — ABNORMAL LOW (ref 4.0–10.5)
nRBC: 0 % (ref 0.0–0.2)

## 2024-09-19 LAB — PROTIME-INR
INR: 1.1 (ref 0.8–1.2)
Prothrombin Time: 14.5 s (ref 11.4–15.2)

## 2024-09-19 SURGERY — RADIOLOGY WITH ANESTHESIA
Anesthesia: Monitor Anesthesia Care

## 2024-09-19 MED ORDER — SUGAMMADEX SODIUM 200 MG/2ML IV SOLN
INTRAVENOUS | Status: DC | PRN
  Administered 2024-09-19: 200 mg via INTRAVENOUS

## 2024-09-19 MED ORDER — FENTANYL CITRATE (PF) 100 MCG/2ML IJ SOLN
INTRAMUSCULAR | Status: AC
  Filled 2024-09-19: qty 2

## 2024-09-19 MED ORDER — MORPHINE SULF MICROINFUSION PF 200 MG/20ML (10 MG/ML) IJ SOLN
20.0000 mL | INTRAMUSCULAR | Status: DC
  Administered 2024-09-19: 200 mg via INTRATHECAL

## 2024-09-19 MED ORDER — AMISULPRIDE (ANTIEMETIC) 5 MG/2ML IV SOLN: 10.0000 mg | Freq: Once | INTRAVENOUS | Status: DC | PRN

## 2024-09-19 MED ORDER — LIDOCAINE HCL (PF) 1 % IJ SOLN
INTRAMUSCULAR | Status: AC
  Filled 2024-09-19: qty 30

## 2024-09-19 MED ORDER — MORPHINE SULF MICROINFUSION PF 200 MG/20ML (10 MG/ML) IJ SOLN
INTRAMUSCULAR | Status: AC
  Filled 2024-09-19: qty 20

## 2024-09-19 MED ORDER — CHLORHEXIDINE GLUCONATE 0.12 % MT SOLN
15.0000 mL | Freq: Once | OROMUCOSAL | Status: AC
  Administered 2024-09-19: 15 mL via OROMUCOSAL

## 2024-09-19 MED ORDER — DEXAMETHASONE SODIUM PHOSPHATE 10 MG/ML IJ SOLN
INTRAMUSCULAR | Status: DC | PRN
  Administered 2024-09-19: 5 mg via INTRAVENOUS

## 2024-09-19 MED ORDER — INSULIN ASPART 100 UNIT/ML IJ SOLN: 0.0000 [IU] | INTRAMUSCULAR | Status: DC | PRN

## 2024-09-19 MED ORDER — FENTANYL CITRATE PF 50 MCG/ML IJ SOSY
PREFILLED_SYRINGE | INTRAMUSCULAR | Status: AC
  Filled 2024-09-19: qty 1

## 2024-09-19 MED ORDER — PHENYLEPHRINE HCL-NACL 20-0.9 MG/250ML-% IV SOLN
INTRAVENOUS | Status: AC
  Filled 2024-09-19: qty 250

## 2024-09-19 MED ORDER — LIDOCAINE HCL (PF) 1 % IJ SOLN
30.0000 mL | Freq: Once | INTRAMUSCULAR | Status: AC
  Administered 2024-09-19: 20 mL

## 2024-09-19 MED ORDER — OXYCODONE HCL 5 MG/5ML PO SOLN: 5.0000 mg | Freq: Once | ORAL | Status: DC | PRN

## 2024-09-19 MED ORDER — HYDROMORPHONE HCL 1 MG/ML IJ SOLN
INTRAMUSCULAR | Status: DC | PRN
  Administered 2024-09-19 (×4): .25 mg via INTRAVENOUS
  Administered 2024-09-19 (×2): .5 mg via INTRAVENOUS

## 2024-09-19 MED ORDER — ONDANSETRON HCL 4 MG/2ML IJ SOLN
INTRAMUSCULAR | Status: DC | PRN
  Administered 2024-09-19: 4 mg via INTRAVENOUS

## 2024-09-19 MED ORDER — PHENYLEPHRINE HCL-NACL 20-0.9 MG/250ML-% IV SOLN
INTRAVENOUS | Status: DC | PRN
  Administered 2024-09-19: 80 ug/min via INTRAVENOUS

## 2024-09-19 MED ORDER — KETAMINE HCL 10 MG/ML IJ SOLN
INTRAMUSCULAR | Status: AC
  Filled 2024-09-19: qty 1

## 2024-09-19 MED ORDER — PROPOFOL 500 MG/50ML IV EMUL
INTRAVENOUS | Status: DC | PRN
Start: 2024-09-19 — End: 2024-09-19
  Administered 2024-09-19: 30 mg via INTRAVENOUS
  Administered 2024-09-19: 20 mg via INTRAVENOUS
  Administered 2024-09-19: 130 mg via INTRAVENOUS

## 2024-09-19 MED ORDER — KETAMINE HCL 10 MG/ML IJ SOLN
INTRAMUSCULAR | Status: DC | PRN
Start: 2024-09-19 — End: 2024-09-19
  Administered 2024-09-19: 10 mg via INTRAVENOUS

## 2024-09-19 MED ORDER — HYDROMORPHONE HCL 1 MG/ML IJ SOLN: 0.2500 mg | INTRAMUSCULAR | Status: DC | PRN

## 2024-09-19 MED ORDER — CHLORHEXIDINE GLUCONATE CLOTH 2 % EX PADS: 6.0000 | MEDICATED_PAD | Freq: Every day | CUTANEOUS | Status: DC

## 2024-09-19 MED ORDER — FENTANYL CITRATE (PF) 100 MCG/2ML IJ SOLN
INTRAMUSCULAR | Status: DC | PRN
  Administered 2024-09-19 (×2): 100 ug via INTRAVENOUS

## 2024-09-19 MED ORDER — CEFAZOLIN SODIUM-DEXTROSE 2-4 GM/100ML-% IV SOLN
2.0000 g | INTRAVENOUS | Status: AC
  Administered 2024-09-19: 2 g via INTRAVENOUS
  Filled 2024-09-19: qty 100

## 2024-09-19 MED ORDER — PHENYLEPHRINE HCL (PRESSORS) 10 MG/ML IV SOLN
INTRAVENOUS | Status: DC | PRN
  Administered 2024-09-19: 80 ug via INTRAVENOUS

## 2024-09-19 MED ORDER — LIDOCAINE HCL (PF) 2 % IJ SOLN
INTRAMUSCULAR | Status: DC | PRN
  Administered 2024-09-19: 100 mg via INTRADERMAL

## 2024-09-19 MED ORDER — LIDOCAINE-EPINEPHRINE 1 %-1:100000 IJ SOLN
INTRAMUSCULAR | Status: AC
  Filled 2024-09-19: qty 1

## 2024-09-19 MED ORDER — ROCURONIUM BROMIDE 10 MG/ML (PF) SYRINGE
PREFILLED_SYRINGE | INTRAVENOUS | Status: DC | PRN
  Administered 2024-09-19: 20 mg via INTRAVENOUS
  Administered 2024-09-19: 30 mg via INTRAVENOUS
  Administered 2024-09-19: 50 mg via INTRAVENOUS

## 2024-09-19 MED ORDER — HYDROMORPHONE HCL 1 MG/ML IJ SOLN
INTRAMUSCULAR | Status: AC
  Filled 2024-09-19: qty 1

## 2024-09-19 MED ORDER — SODIUM CHLORIDE 0.9 % IV SOLN: INTRAVENOUS | Status: DC

## 2024-09-19 MED ORDER — OXYCODONE HCL 5 MG PO TABS: 5.0000 mg | ORAL_TABLET | Freq: Once | ORAL | Status: DC | PRN

## 2024-09-19 MED ORDER — LACTATED RINGERS IV SOLN: INTRAVENOUS | Status: DC | PRN

## 2024-09-19 MED ORDER — LIDOCAINE-EPINEPHRINE 1 %-1:100000 IJ SOLN
20.0000 mL | Freq: Once | INTRAMUSCULAR | Status: AC
  Administered 2024-09-19: 20 mL via INTRADERMAL

## 2024-09-19 NOTE — Progress Notes (Signed)
 Order entered to access and de-access port at [facility] for contrast administration during brain MRI.   Devere Perch R.T(R)(T) Radiation Special Procedures Lead

## 2024-09-19 NOTE — Transfer of Care (Signed)
 Immediate Anesthesia Transfer of Care Note  Patient: Adam Wise  Procedure(s) Performed: Procedure(s): RADIOLOGY WITH ANESTHESIA (N/A)  Patient Location: PACU  Anesthesia Type:General  Level of Consciousness: Patient easily awoken, comfortable, cooperative, following commands, responds to stimulation.   Airway & Oxygen  Therapy: Patient spontaneously breathing, ventilating well, oxygen  via simple oxygen  mask.  Post-op Assessment: Report given to PACU RN, vital signs reviewed and stable, moving all extremities.   Post vital signs: Reviewed and stable.  Complications: No apparent anesthesia complications  Last Vitals:  Vitals Value Taken Time  BP 101/60 09/19/24 11:45  Temp    Pulse 104 09/19/24 11:46  Resp 16 09/19/24 11:46  SpO2 100 % 09/19/24 11:46  Vitals shown include unfiled device data.  Last Pain:  Vitals:   09/19/24 0728  TempSrc:   PainSc: 10-Worst pain ever      Patients Stated Pain Goal: 5 (09/19/24 0728)  Complications: No notable events documented.

## 2024-09-19 NOTE — Telephone Encounter (Signed)
 Ms. Yeldell requested the MRI for her husband to be moved to [facility] on [address] instead of [facility]. I have rescheduled this per her requested and left a detailed message about the adjusted scan and sim dates.   Devere Perch R.T(R)(T) Radiation Special Procedures Lead

## 2024-09-19 NOTE — Telephone Encounter (Signed)
 Per Dr. Sherrod ,it is okay to proceed with intrathecal catheter placement with IR and anesthesiologist today and today's HGB 7.6.  Dr. Sherrod ordered 1 unit of RBCs to be given this week. Schedule message sent.

## 2024-09-19 NOTE — Procedures (Signed)
  Procedure:  Implanted intrathecal pump placement tip T9 Preprocedure diagnosis: Diagnoses of Small cell lung cancer, right upper lobe [facility]), Palliative care patient, and Neoplasm related pain were pertinent to this visit. Postprocedure diagnosis: same EBL:    minimal Complications:   none immediate  See full dictation in [employer].  CHARM Toribio Faes MD Main # [phone] Pager  [phone] Mobile [phone]

## 2024-09-19 NOTE — Anesthesia Postprocedure Evaluation (Signed)
 Anesthesia Post Note  Patient: Adam Wise  Procedure(s) Performed: RADIOLOGY WITH ANESTHESIA     Patient location during evaluation: PACU Anesthesia Type: General Level of consciousness: awake and alert Pain management: pain level controlled Vital Signs Assessment: post-procedure vital signs reviewed and stable Respiratory status: spontaneous breathing, nonlabored ventilation and respiratory function stable Cardiovascular status: blood pressure returned to baseline Postop Assessment: no apparent nausea or vomiting Anesthetic complications: no   No notable events documented.  Last Vitals:  Vitals:   09/19/24 1315 09/19/24 1321  BP: 107/79   Pulse: (!) 108 (!) 109  Resp: (!) 0 19  Temp:    SpO2: (!) 89% 90%    Last Pain:  Vitals:   09/19/24 1315  TempSrc:   PainSc: 0-No pain                 Vertell Row

## 2024-09-19 NOTE — Progress Notes (Signed)
 Patient in for TDD Medtronic Intrathecal Pain Pump Placement (20ml). Pump fill with Infumorph . Will set pump post-procedure 0.5mg /day. Will follow up in clinic and wean systemic opioids as tolerated.   Adam General, DO Palliative Medicine

## 2024-09-19 NOTE — Anesthesia Procedure Notes (Signed)
 Procedure Name: Intubation Date/Time: 09/19/2024 9:19 AM  Performed by: Joshua Vernell BROCKS, CRNAPre-anesthesia Checklist: Patient identified, Emergency Drugs available, Suction available and Patient being monitored Patient Re-evaluated:Patient Re-evaluated prior to induction Oxygen  Delivery Method: Circle system utilized Preoxygenation: Pre-oxygenation with 100% oxygen  Induction Type: IV induction Ventilation: Mask ventilation without difficulty Laryngoscope Size: Mac and 4 Grade View: Grade II Tube type: Oral Tube size: 7.5 mm Number of attempts: 1 Airway Equipment and Method: Stylet Placement Confirmation: ETT inserted through vocal cords under direct vision, positive ETCO2 and breath sounds checked- equal and bilateral Secured at: 21 cm Tube secured with: Tape Dental Injury: Teeth and Oropharynx as per pre-operative assessment

## 2024-09-20 ENCOUNTER — Other Ambulatory Visit: Payer: Self-pay | Admitting: Medical Oncology

## 2024-09-20 ENCOUNTER — Ambulatory Visit

## 2024-09-20 ENCOUNTER — Ambulatory Visit: Admitting: Radiation Oncology

## 2024-09-20 ENCOUNTER — Encounter (HOSPITAL_COMMUNITY): Payer: Self-pay | Admitting: Radiology

## 2024-09-20 ENCOUNTER — Telehealth: Payer: Self-pay | Admitting: Internal Medicine

## 2024-09-20 ENCOUNTER — Inpatient Hospital Stay: Attending: Radiation Oncology

## 2024-09-20 ENCOUNTER — Telehealth: Payer: Self-pay | Admitting: *Deleted

## 2024-09-20 DIAGNOSIS — T451X5A Adverse effect of antineoplastic and immunosuppressive drugs, initial encounter: Secondary | ICD-10-CM

## 2024-09-20 DIAGNOSIS — D649 Anemia, unspecified: Secondary | ICD-10-CM

## 2024-09-20 DIAGNOSIS — C787 Secondary malignant neoplasm of liver and intrahepatic bile duct: Secondary | ICD-10-CM | POA: Diagnosis not present

## 2024-09-20 DIAGNOSIS — Z515 Encounter for palliative care: Secondary | ICD-10-CM | POA: Diagnosis not present

## 2024-09-20 DIAGNOSIS — K5903 Drug induced constipation: Secondary | ICD-10-CM | POA: Diagnosis not present

## 2024-09-20 DIAGNOSIS — R112 Nausea with vomiting, unspecified: Secondary | ICD-10-CM | POA: Insufficient documentation

## 2024-09-20 DIAGNOSIS — R197 Diarrhea, unspecified: Secondary | ICD-10-CM | POA: Diagnosis not present

## 2024-09-20 DIAGNOSIS — C3411 Malignant neoplasm of upper lobe, right bronchus or lung: Secondary | ICD-10-CM | POA: Diagnosis not present

## 2024-09-20 DIAGNOSIS — D6481 Anemia due to antineoplastic chemotherapy: Secondary | ICD-10-CM | POA: Diagnosis not present

## 2024-09-20 DIAGNOSIS — D696 Thrombocytopenia, unspecified: Secondary | ICD-10-CM

## 2024-09-20 DIAGNOSIS — M6281 Muscle weakness (generalized): Secondary | ICD-10-CM | POA: Insufficient documentation

## 2024-09-20 DIAGNOSIS — Z5111 Encounter for antineoplastic chemotherapy: Secondary | ICD-10-CM | POA: Insufficient documentation

## 2024-09-20 DIAGNOSIS — G893 Neoplasm related pain (acute) (chronic): Secondary | ICD-10-CM | POA: Diagnosis not present

## 2024-09-20 DIAGNOSIS — C7951 Secondary malignant neoplasm of bone: Secondary | ICD-10-CM | POA: Insufficient documentation

## 2024-09-20 LAB — CBC WITH DIFFERENTIAL (CANCER CENTER ONLY)
Abs Immature Granulocytes: 0.02 K/uL (ref 0.00–0.07)
Basophils Absolute: 0 K/uL (ref 0.0–0.1)
Basophils Relative: 0 %
Eosinophils Absolute: 0 K/uL (ref 0.0–0.5)
Eosinophils Relative: 0 %
HCT: 23 % — ABNORMAL LOW (ref 39.0–52.0)
Hemoglobin: 7.2 g/dL — ABNORMAL LOW (ref 13.0–17.0)
Immature Granulocytes: 1 %
Lymphocytes Relative: 9 %
Lymphs Abs: 0.3 K/uL — ABNORMAL LOW (ref 0.7–4.0)
MCH: 30.6 pg (ref 26.0–34.0)
MCHC: 31.3 g/dL (ref 30.0–36.0)
MCV: 97.9 fL (ref 80.0–100.0)
Monocytes Absolute: 0.5 K/uL (ref 0.1–1.0)
Monocytes Relative: 19 %
Neutro Abs: 1.9 K/uL (ref 1.7–7.7)
Neutrophils Relative %: 71 %
Platelet Count: 181 K/uL (ref 150–400)
RBC: 2.35 MIL/uL — ABNORMAL LOW (ref 4.22–5.81)
RDW: 18.6 % — ABNORMAL HIGH (ref 11.5–15.5)
WBC Count: 2.7 K/uL — ABNORMAL LOW (ref 4.0–10.5)
nRBC: 0.7 % — ABNORMAL HIGH (ref 0.0–0.2)

## 2024-09-20 LAB — SAMPLE TO BLOOD BANK

## 2024-09-20 LAB — PREPARE RBC (CROSSMATCH)

## 2024-09-20 LAB — TYPE AND SCREEN
ABO/RH(D): B POS
Antibody Screen: NEGATIVE

## 2024-09-20 MED ORDER — SODIUM CHLORIDE 0.9% IV SOLUTION
250.0000 mL | INTRAVENOUS | Status: DC
  Administered 2024-09-20: 100 mL via INTRAVENOUS

## 2024-09-20 MED ORDER — ACETAMINOPHEN 325 MG PO TABS
650.0000 mg | ORAL_TABLET | Freq: Once | ORAL | Status: AC
  Administered 2024-09-20: 650 mg via ORAL
  Filled 2024-09-20: qty 2

## 2024-09-20 MED ORDER — DIPHENHYDRAMINE HCL 25 MG PO CAPS
25.0000 mg | ORAL_CAPSULE | Freq: Once | ORAL | Status: AC
  Administered 2024-09-20: 25 mg via ORAL
  Filled 2024-09-20: qty 1

## 2024-09-20 NOTE — Progress Notes (Signed)
 Per Dr. Sherrod , it is okay to transfuse blood at 300 ml/hr .

## 2024-09-20 NOTE — Progress Notes (Signed)
 Transfuse ordered

## 2024-09-20 NOTE — Patient Instructions (Signed)
 Blood Transfusion, Adult A blood transfusion is a procedure in which you receive blood or a type of blood cell (blood component) through an IV. You may need a blood transfusion when you have a low blood count, which is a low number of any blood cell. This may result from a bleeding disorder, illness, injury, or surgery. The blood may come from a donor, or you may be able to have your own blood collected and stored (autologous blood donation) before a planned surgery. The blood given in a transfusion may be made up of different blood components. You may receive: Red blood cells. These carry oxygen to the cells in the body. Platelets. These help your blood to clot. Plasma. This is the liquid part of your blood. It carries proteins and other substances throughout the body. White blood cells. These help you fight infections. If you have hemophilia or another clotting disorder, you may also receive other types of blood products. Depending on the type of blood product, this procedure may take 1-4 hours to complete. Tell a health care provider about: Any bleeding problems you have. Any previous reactions you have had during a blood transfusion. Any allergies you have. All medicines you are taking, including vitamins, herbs, eye drops, creams, and over-the-counter medicines. Any surgeries you have had. Any medical conditions you have. Whether you are pregnant or may be pregnant. What are the risks? Talk with your health care provider about risks. The most common problems include: A mild allergic reaction, such as red, swollen areas of skin (hives) and itching. Fever or chills. This may be the body's response to new blood cells received. This may occur during or up to 4 hours after the transfusion. More serious problems may include: A serious allergic reaction that causes difficulty breathing or swelling around the face and lips. Transfusion-associated circulatory overload (TACO), or too much fluid in  the lungs. This may cause breathing problems. Transfusion-related acute lung injury (TRALI), which causes breathing difficulty and low oxygen in the blood. This can occur within hours of the transfusion or several days later. Iron overload. This can happen after receiving many blood transfusions over a period of time. Infection or virus being transmitted. This is rare because donated blood is carefully tested before it is given. Hemolytic transfusion reaction. This is rare. It happens when the body's defense system (immune system)tries to attack the new blood cells. Symptoms may include fever, chills, nausea, low blood pressure, and low back or chest pain. Transfusion-associated graft-versus-host disease (TAGVHD). This is rare. It happens when donated cells attack the body's healthy tissues. What happens before the procedure? You will have a blood test to check your blood type. This test is done to know what kind of blood your body will accept and to match it to the donor blood. If you are going to have a planned surgery, you may be able to do an autologous blood donation. This may be done in case you need to have a transfusion. You will have your temperature, blood pressure, and pulse checked before the transfusion. If you have had an allergic reaction to a transfusion in the past, you may be given medicine to help prevent a reaction. This medicine may be given to you by mouth (orally) or through an IV. What happens during the procedure?  An IV will be inserted into one of your veins. The bag of blood will be attached to your IV. The blood will then enter through your vein. Your temperature, blood pressure,  and pulse will be monitored during the transfusion. This monitoring is done to detect early signs of a transfusion reaction. Tell your nurse right away if you have any of these symptoms during the transfusion: Shortness of breath or trouble breathing. Chest or back pain. Fever or  chills. Itching or hives. If you have any signs or symptoms of a reaction, your transfusion will be stopped and you may be given medicine. When the transfusion is complete, your IV will be removed. Pressure may be applied to the IV site for a few minutes. A bandage (dressing)will be applied. The procedure may vary among health care providers and hospitals. What happens after the procedure? Your temperature, blood pressure, pulse, breathing rate, and blood oxygen level will be monitored until you leave the hospital or clinic. Your blood may be tested to see how you have responded to the transfusion. You may be warmed with fluids or blankets to maintain a normal body temperature. If you receive your blood transfusion in an outpatient setting, you will be told whom to contact to report any reactions. Where to find more information Visit the [employer]: redcross.org Summary A blood transfusion is a procedure in which you receive blood or a type of blood cell (blood component) through an IV. The blood given in a transfusion may be made up of different blood components. You may receive red blood cells, platelets, plasma, or white blood cells depending on the condition treated. Your temperature, blood pressure, and pulse will be monitored before, during, and after the transfusion. After the transfusion, your blood may be tested to see how your body has responded. This information is not intended to replace advice given to you by your health care provider. Make sure you discuss any questions you have with your health care provider. Document Revised: 02/28/2022 Document Reviewed: 02/28/2022 [employer] Patient Education  2024 [employer].

## 2024-09-20 NOTE — Progress Notes (Signed)
 Type and screen a/prepare released

## 2024-09-20 NOTE — Telephone Encounter (Signed)
 RETURNED PATIENT'S WIFE'S PHONE CALL ([name]), SPOKE WITH MS. Prestigiacomo

## 2024-09-20 NOTE — Telephone Encounter (Signed)
 Left the patient a voicemail informing them that we are okay to proceed with the scheduled appointments today.

## 2024-09-20 NOTE — Progress Notes (Signed)
Blood transfusion ordered.

## 2024-09-21 ENCOUNTER — Inpatient Hospital Stay (HOSPITAL_BASED_OUTPATIENT_CLINIC_OR_DEPARTMENT_OTHER): Admitting: Nurse Practitioner

## 2024-09-21 ENCOUNTER — Inpatient Hospital Stay

## 2024-09-21 ENCOUNTER — Other Ambulatory Visit (HOSPITAL_COMMUNITY): Payer: Self-pay

## 2024-09-21 ENCOUNTER — Encounter: Payer: Self-pay | Admitting: Nurse Practitioner

## 2024-09-21 ENCOUNTER — Inpatient Hospital Stay (HOSPITAL_BASED_OUTPATIENT_CLINIC_OR_DEPARTMENT_OTHER): Admitting: Internal Medicine

## 2024-09-21 VITALS — BP 109/75 | Ht 69.0 in | Wt 225.0 lb

## 2024-09-21 VITALS — HR 98 | Temp 98.1°F | Resp 17

## 2024-09-21 VITALS — BP 109/75 | HR 125 | Temp 97.2°F | Resp 18 | Ht 69.0 in | Wt 228.8 lb

## 2024-09-21 DIAGNOSIS — R530 Neoplastic (malignant) related fatigue: Secondary | ICD-10-CM

## 2024-09-21 DIAGNOSIS — C3411 Malignant neoplasm of upper lobe, right bronchus or lung: Secondary | ICD-10-CM

## 2024-09-21 DIAGNOSIS — C3490 Malignant neoplasm of unspecified part of unspecified bronchus or lung: Secondary | ICD-10-CM

## 2024-09-21 DIAGNOSIS — Z515 Encounter for palliative care: Secondary | ICD-10-CM

## 2024-09-21 DIAGNOSIS — G893 Neoplasm related pain (acute) (chronic): Secondary | ICD-10-CM

## 2024-09-21 DIAGNOSIS — Z5111 Encounter for antineoplastic chemotherapy: Secondary | ICD-10-CM | POA: Diagnosis not present

## 2024-09-21 DIAGNOSIS — C787 Secondary malignant neoplasm of liver and intrahepatic bile duct: Secondary | ICD-10-CM | POA: Diagnosis not present

## 2024-09-21 DIAGNOSIS — C7951 Secondary malignant neoplasm of bone: Secondary | ICD-10-CM | POA: Diagnosis not present

## 2024-09-21 LAB — TYPE AND SCREEN
ABO/RH(D): B POS
Antibody Screen: NEGATIVE
Unit division: 0

## 2024-09-21 LAB — CBC WITH DIFFERENTIAL (CANCER CENTER ONLY)
Abs Immature Granulocytes: 0.04 K/uL (ref 0.00–0.07)
Basophils Absolute: 0 K/uL (ref 0.0–0.1)
Basophils Relative: 1 %
Eosinophils Absolute: 0 K/uL (ref 0.0–0.5)
Eosinophils Relative: 1 %
HCT: 25.6 % — ABNORMAL LOW (ref 39.0–52.0)
Hemoglobin: 8.3 g/dL — ABNORMAL LOW (ref 13.0–17.0)
Immature Granulocytes: 1 %
Lymphocytes Relative: 6 %
Lymphs Abs: 0.2 K/uL — ABNORMAL LOW (ref 0.7–4.0)
MCH: 31 pg (ref 26.0–34.0)
MCHC: 32.4 g/dL (ref 30.0–36.0)
MCV: 95.5 fL (ref 80.0–100.0)
Monocytes Absolute: 0.7 K/uL (ref 0.1–1.0)
Monocytes Relative: 20 %
Neutro Abs: 2.5 K/uL (ref 1.7–7.7)
Neutrophils Relative %: 71 %
Platelet Count: 165 K/uL (ref 150–400)
RBC: 2.68 MIL/uL — ABNORMAL LOW (ref 4.22–5.81)
RDW: 19.9 % — ABNORMAL HIGH (ref 11.5–15.5)
WBC Count: 3.5 K/uL — ABNORMAL LOW (ref 4.0–10.5)
nRBC: 0.6 % — ABNORMAL HIGH (ref 0.0–0.2)

## 2024-09-21 LAB — CMP (CANCER CENTER ONLY)
ALT: 16 U/L (ref 0–44)
AST: 23 U/L (ref 15–41)
Albumin: 3.2 g/dL — ABNORMAL LOW (ref 3.5–5.0)
Alkaline Phosphatase: 157 U/L — ABNORMAL HIGH (ref 38–126)
Anion gap: 7 (ref 5–15)
BUN: 18 mg/dL (ref 8–23)
CO2: 29 mmol/L (ref 22–32)
Calcium: 8.3 mg/dL — ABNORMAL LOW (ref 8.9–10.3)
Chloride: 100 mmol/L (ref 98–111)
Creatinine: 0.67 mg/dL (ref 0.61–1.24)
GFR, Estimated: >60 mL/min (ref >60)
Glucose, Bld: 203 mg/dL — ABNORMAL HIGH (ref 70–99)
Potassium: 3.8 mmol/L (ref 3.5–5.1)
Sodium: 136 mmol/L (ref 135–145)
Total Bilirubin: 0.4 mg/dL (ref 0.0–1.2)
Total Protein: 5.9 g/dL — ABNORMAL LOW (ref 6.5–8.1)

## 2024-09-21 LAB — BPAM RBC
Blood Product Expiration Date: 202510192359
ISSUE DATE / TIME: 202510071602
Unit Type and Rh: 7300

## 2024-09-21 MED ORDER — SODIUM CHLORIDE 0.9 % IV SOLN
490.0000 mg | Freq: Once | INTRAVENOUS | Status: AC
  Administered 2024-09-21: 490 mg via INTRAVENOUS
  Filled 2024-09-21: qty 49

## 2024-09-21 MED ORDER — SODIUM CHLORIDE 0.9 % IV SOLN
178.0000 mg | Freq: Once | INTRAVENOUS | Status: AC
  Administered 2024-09-21: 178 mg via INTRAVENOUS
  Filled 2024-09-21: qty 8.9

## 2024-09-21 MED ORDER — FAMOTIDINE IN NACL 20-0.9 MG/50ML-% IV SOLN
20.0000 mg | Freq: Once | INTRAVENOUS | Status: AC
  Administered 2024-09-21: 20 mg via INTRAVENOUS
  Filled 2024-09-21: qty 50

## 2024-09-21 MED ORDER — PALONOSETRON HCL INJECTION 0.25 MG/5ML
0.2500 mg | Freq: Once | INTRAVENOUS | Status: AC
  Administered 2024-09-21: 0.25 mg via INTRAVENOUS
  Filled 2024-09-21: qty 5

## 2024-09-21 MED ORDER — APREPITANT 130 MG/18ML IV EMUL
130.0000 mg | Freq: Once | INTRAVENOUS | Status: AC
  Administered 2024-09-21: 130 mg via INTRAVENOUS
  Filled 2024-09-21: qty 18

## 2024-09-21 MED ORDER — SODIUM CHLORIDE 0.9 % IV SOLN: INTRAVENOUS | Status: DC

## 2024-09-21 MED ORDER — DEXAMETHASONE SODIUM PHOSPHATE 10 MG/ML IJ SOLN
10.0000 mg | Freq: Once | INTRAMUSCULAR | Status: AC
  Administered 2024-09-21: 10 mg via INTRAVENOUS
  Filled 2024-09-21: qty 1

## 2024-09-21 MED ORDER — TRILACICLIB DIHYDROCHLORIDE INJECTION 300 MG
240.0000 mg/m2 | Freq: Once | INTRAVENOUS | Status: AC
  Administered 2024-09-21: 600 mg via INTRAVENOUS
  Filled 2024-09-21: qty 40

## 2024-09-21 MED ORDER — DIPHENHYDRAMINE HCL 50 MG/ML IJ SOLN
25.0000 mg | Freq: Once | INTRAMUSCULAR | Status: AC
  Administered 2024-09-21: 25 mg via INTRAVENOUS
  Filled 2024-09-21: qty 1

## 2024-09-21 NOTE — Progress Notes (Signed)
 Palliative Medicine [facility]  [phone]) [phone] [phone]) [phone]   Name: Adam Wise Date: 09/21/2024 MRN: [id number]  DOB: 05-23-1951  Patient Care Team: [name], NP as PCP - General (Internal Medicine) Anner Alm ORN, MD as PCP - Cardiology (Cardiology) Evertt Lonell BROCKS, RN as Oncology Nurse Navigator Pickenpack-Cousar, Fannie SAILOR, NP as Nurse Practitioner (Hospice and Palliative Medicine) Heilingoetter, Calton CROME, PA-C as Physician Assistant (Physician Assistant) Vivienne Lonni Ingle, NP as Nurse Practitioner (Cardiology)    INTERVAL HISTORY: Adam Wise is a 73 y.o. 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 Palliative ask to see for symptom management and goals of care.   SOCIAL HISTORY:     reports that he has quit smoking. He has never been exposed to tobacco smoke. He has never used smokeless tobacco. He reports that he does not currently use alcohol. He reports that he does not currently use drugs.  ADVANCE DIRECTIVES:  Advanced directives on file naming Adam Wise, Adam Wise, Adam Wise, and Adam Wise as decision makers for this pt should AdamWise become unable to speak for himself.   CODE STATUS: Full code  PAST MEDICAL HISTORY: Past Medical History:  Diagnosis Date   Acute on chronic respiratory failure with hypoxia (HCC) 10/12/2021   Anxiety    Arthritis    Chickenpox    Chronic knee pain    Chronic low back pain    COPD exacerbation (HCC) 10/11/2021   COPD with exacerbation (HCC) 10/12/2021   Coronary artery calcification seen on CAT scan 11/2021   Coronary CTA 11/27/2021: Moderate to large right pleural effusion and compressive atelectasis right lung base. => Coronary Calcium Score 657.  Diffuse RCA calcification.  Minimal mild disease in the LAD and diagonal branches. == Overall limited study.  Notable  artifact.   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 He has disease progression after the first line of chemotherapy in December 2021.   Testosterone  deficiency    Type 2 diabetes mellitus (HCC)     ALLERGIES:  is allergic to bupropion, hydrochlorothiazide, and lisinopril .  MEDICATIONS:  Current Outpatient Medications  Medication Sig Dispense Refill   albuterol  (VENTOLIN  HFA) 108 (90 Base) MCG/ACT inhaler Inhale 2 puffs into the lungs every 4 (four) hours as needed for shortness of breath. 18 each 0   B-D 3CC LUER-LOK SYR 22GX1 22G X 1 3 ML MISC USE AS INSTRUCTED FOR TESTOSTERONE  INJECTION EVERY 2 WEEKS 10 each 2   Black Pepper-Turmeric (TURMERIC COMPLEX/BLACK PEPPER PO) Take 1 tablet by mouth in the morning and at bedtime.     blood glucose meter kit and supplies KIT Dispense based on patient and insurance preference. Use up to four times daily as directed. 1 each 0   Calcium Carb-Cholecalciferol (CALCIUM PLUS VITAMIN D3 PO) Take 1 tablet by mouth in the morning and at bedtime.     Coenzyme Q10 (COQ10) 200 MG CAPS Take 200  mg by mouth daily.     Continuous Glucose Sensor (FREESTYLE LIBRE 3 PLUS SENSOR) MISC Use to check blood sugar continuously. Change sensor every 15 days. 6 each 1   cyclobenzaprine  (FLEXERIL ) 10 MG tablet Take 1 tablet (10 mg total) by mouth 2 (two) times daily as needed for muscle spasms. 20 tablet 0   docusate sodium  (COLACE) 100 MG capsule Take 1 capsule (100 mg total) by  mouth every 12 (twelve) hours. (Patient taking differently: Take 100 mg by mouth daily as needed for moderate constipation.) 60 capsule 0   ELIQUIS  5 MG TABS tablet TAKE 1 TABLET BY MOUTH TWICE A DAY 180 tablet 2   esomeprazole (NEXIUM) 20 MG capsule Take 20 mg by mouth in the morning and at bedtime.     gabapentin  (NEURONTIN ) 300 MG capsule Take 1 capsule (300 mg total) by mouth 2 (two) times daily. For back pain. 180 capsule 1   glucose blood (ONETOUCH ULTRA) test strip USE UP TO 4 TIMES DAILY AS DIRECTED 400 strip 5   Insulin  Pen Needle (BD PEN NEEDLE NANO U/F) 32G X 4 MM MISC Use with insulin  as prescribed Dx Code: E11.9 100 each 3   ipratropium-albuterol  (DUONEB) 0.5-2.5 (3) MG/3ML SOLN Take 3 mLs by nebulization every 6 (six) hours as needed (SOB/Wheezing).     KRILL OIL PO Take 1 capsule by mouth daily.     Lancets MISC USE UP TO 3 TIMES DAILY AS DIRECTED 100 each 2   lidocaine  (LIDODERM ) 5 % Place 1 patch onto the skin daily. Remove & Discard patch within 12 hours or as directed by MD 30 patch 3   losartan  (COZAAR ) 25 MG tablet Take 1 tablet (25 mg total) by mouth daily. For blood pressure.     metFORMIN  (GLUCOPHAGE ) 1000 MG tablet TAKE 1 TABLET (1,000 MG TOTAL) BY MOUTH 2 (TWO) TIMES DAILY WITH A MEAL. FOR DIABETES 180 tablet 0   mirtazapine  (REMERON ) 15 MG tablet TAKE 1 TABLET BY MOUTH EVERYDAY AT BEDTIME 90 tablet 1   morphine  (MS CONTIN ) 30 MG 12 hr tablet Take 1 tablet (30 mg total) by mouth every 8 (eight) hours as needed. (Patient taking differently: Take 30 mg by mouth every 6 (six) hours as needed for pain.) 90 tablet 0   Multiple Vitamin (MULTI-VITAMINS) TABS Take 1 tablet by mouth daily with breakfast.     Naloxone  HCl 3 MG/0.1ML LIQD Place 1 spray into both nostrils once. For known/suspected opiod overdose. Every 2-3 minutes in alternating nostril till EMS arrives.     ondansetron  (ZOFRAN ) 8 MG tablet Take 1 tablet (8 mg total) by mouth every 8 (eight) hours as needed for nausea  or vomiting. 30 tablet 3   oxyCODONE -acetaminophen  (PERCOCET) 10-325 MG tablet Take 1-1.5 tablets by mouth every 4 (four) hours as needed for pain. 90 tablet 0   pioglitazone  (ACTOS ) 45 MG tablet TAKE 1 TABLET (45 MG TOTAL) BY MOUTH DAILY. FOR DIABETES. 90 tablet 1   polyethylene glycol powder (GLYCOLAX /MIRALAX ) 17 GM/SCOOP powder Take 17 g by mouth daily as needed for moderate constipation or mild constipation.     pravastatin  (PRAVACHOL ) 40 MG tablet TAKE 1 TABLET BY MOUTH EVERY DAY IN THE EVENING FOR CHOLESTEROL 90 tablet 2   PRESCRIPTION MEDICATION CPAP- At bedtime     testosterone  cypionate (DEPOTESTOSTERONE CYPIONATE) 200 MG/ML injection Inject 200 mg into the muscle every 14 (fourteen) days.     No current facility-administered medications for this visit.   Facility-Administered Medications Ordered  in Other Visits  Medication Dose Route Frequency Provider Last Rate Last Admin   heparin  lock flush 100 unit/mL  500 Units Intracatheter Once Sherrod Sherrod, MD       sodium chloride  flush (NS) 0.9 % injection 10 mL  10 mL Intracatheter Once Sherrod Sherrod, MD        VITAL SIGNS: BP 109/75 (BP Location: Left Arm, Patient Position: Sitting)   Pulse (!) 125   Temp (!) 97.2 F (36.2 C) (Temporal)   Resp 18   Ht 5' 9 (1.753 m)   Wt 228 lb 12.8 oz (103.8 kg)   SpO2 98%   BMI 33.79 kg/m  Filed Weights   09/21/24 1041  Weight: 228 lb 12.8 oz (103.8 kg)    Estimated body mass index is 33.79 kg/m as calculated from the following:   Height as of this encounter: 5' 9 (1.753 m).   Weight as of this encounter: 228 lb 12.8 oz (103.8 kg).   PERFORMANCE STATUS (ECOG) : 1 - Symptomatic but completely ambulatory   Physical Exam General: NAD Cardiovascular: regular rate and rhythm Pulmonary: normal breathing pattern Extremities: no edema, no joint deformities Skin: no rashes. Lower back insertion site, clean and healing, no redness, right abdomen dressing clean, dry, intact   Neurological: AAO x3  IMPRESSION: Discussed the use of AI scribe software for clinical note transcription with the patient, who gave verbal consent to proceed.  History of Present Illness Adam Wise is a 73 year old male with lung cancer who presented to clinic for symptom management follow-up post intrathecal pump placement.  Denies concerns of nausea, vomiting, or diarrhea.  Occasional constipation, for which he takes Miralax  and 2 Senna tablets at bedtime however not taking daily. Some fatigue but with improvement given improvement in pain.   Adam Wise successfully had Synchromed intrathecal pain pump placed on Monday 10/6 and is tolerating without difficulty.  His pain has significantly improved compared to previous weeks and months.  Patient and family expressed their appreciation. Denies nausea, vomiting, rash, or itching.    His pump is delivering morphine  0.5 mg per hour continuously, totaling about 12 mg over 24 hours. He alternates between oxycodone  and morphine , taking about two doses of each per day, and his every 12 hours doses of morphine . This is a significant decrease in his medications pre-pump placement were he was taking MS Contin  30mg  every 8 hours and 1-2 Oxycodone  every 4-6 hours.   Adam Wise experiences some tenderness at site and ongoing pain, currently rated as 5 out of 10, which has improved from last week when it was more severe. The pain is present but not as intense or prolonged. The pain is worse in the morning and evening.  Education provided on continued pain relief. We will continue to closely monitor pain and adjust pump dosing accordingly. We will set him up with PTM today at 0.02mg  to infuse over with a 6 hours lockout. He is aware this will be available as needed to administer boluses in place of use of oxycodone . Patient's pump was successfully interrogated and PTM device successfully paired prior to discharge today.  We will continue to closely  monitor and adjust accordingly.  All questions answered and support provided.  I discussed the importance of continued conversation with family and their medical providers regarding overall plan of care and treatment options, ensuring decisions are within the context of the patients values and GOCs. Assessment & Plan Cancer pain Cancer related pain with  significant improvement in pain levels since the initiation of the intrathecal morphine  pump. Pain currently rated at 5/10, down from 'off the radar' levels over the last weeks. The morphine  pump is delivering 0.5 mg of morphine  per hour, equivalent to approximately 12 mg over a 24-hour period. The goal is to reduce reliance on oral opioids by titrating down the oral morphine  and oxycodone  as the pump dosage is optimized. He has been using oxycodone  and morphine  tablets as needed, with a plan to reduce oral morphine  to one tablet per day starting next week. A bolus feature on the pump will be used to manage breakthrough pain, replacing the need for oxycodone . - Reduce oral morphine  to one tablet per day starting next Monday. - Utilize the bolus feature (PTM) on the pump for breakthrough pain, allowing a bolus every six hours as needed. - Pump settings remains at 0.5 mg morphine  per hour and 0.02 mg morphine  bolus every 6 hours as needed with a lockout. - Pump successfully interrogated and PTM device successfully connected.  Opioid-induced constipation Opioid-induced constipation managed with Miralax . No significant constipation issues reported recently, with one episode resolved with intervention. He is taking Miralax  as needed, with instructions to increase mobility as pain allows to further alleviate constipation. - Continue Miralax  as needed for constipation. - Encourage increased mobility as pain allows to help alleviate constipation.  I will plan to see patient back in 2-3 weeks. Sooner if needed.  Patient expressed understanding and was in  agreement with this plan. He also understands that He can call the clinic at any time with any questions, concerns, or complaints.   Any controlled substances utilized were prescribed in the context of palliative care. PDMP has been reviewed.   Visit consisted of counseling and education dealing with the complex and emotionally intense issues of symptom management and palliative care in the setting of serious and potentially life-threatening illness.  Levon Borer, AGPCNP-BC  Palliative Medicine Team/[facility]

## 2024-09-21 NOTE — Progress Notes (Signed)
 [facility] [phone]   [phone]  OFFICE PROGRESS NOTE  Adam Comer POUR, NP [address] Adam Wise [place] [address]  DIAGNOSIS: Relapsed extensive stage (T4, N3, M1c)  small cell lung cancer diagnosed in April 2021 and presented with large right upper lobe/right hilar mass with ipsilateral and contralateral mediastinal and right supraclavicular lymphadenopathy in addition to multiple liver lesions. He has disease progression after the first line of chemotherapy in December 2021.    PRIOR THERAPY:  1) Palliative radiotherapy to the right upper lobe lung mass under the care of Dr. Dewey. 2) Systemic chemotherapy with carboplatin  for AUC of 5 on day 1, etoposide  100 mg/M2 on days 1, 2 and 3 in addition to Imfinzi  1500 mg IV every 3 weeks with chemotherapy then every 4 weeks for maintenance if the patient has no evidence for progression.  He will also receive Cosela  240 mg/m2 on the days of the chemotherapy.  Status post 9 cycles.  Starting from cycle #5 the patient will be on maintenance treatment with immunotherapy with Imfinzi  1500 mg IV every 4 weeks. Last dose of chemotherapy was given on November 13, 2020. This treatment was discontinued secondary to disease progression 3) Systemic chemotherapy with carboplatin  for AUC of 5 on day 1, etoposide  100 mg/M2 on days 1, 2 and 3, Tecentriq  1200 mg IV every 3 weeks as well as Cosela  250 mg/M2 on the days of the chemotherapy every 3 weeks.  First dose December 18, 2020.  Status post 8 cycles. 4) Zepzelca  (lurbinectedin ) 3.2 mgm2 IV every 3 weeks. Last dose on 01/23/22. Status post 12 cycles.  5) Palliative systemic chemotherapy with irinotecan  65 mg/m2 on days 1 and 8 IV every 3 weeks.  Status post 3 cycles.  Last dose was given April 01, 2022 discontinued secondary to disease progression. 6) SBRT to the progressive liver lesions under the care of Dr. Dewey.  Last fraction January 27, 2023. 7) systemic  chemotherapy with carboplatin  for AUC of 5 on day 1, etoposide  100 Mg/M2 on days 1, 2 and 3 with Cosela  before the chemotherapy.  First dose expected to start on 05/28/2022.  Status post 15 cycles.  Starting from cycle #5 his carboplatin  will be reduced to AUC of 4 and 2 etoposide  80 Mg/M2.  Last dose was giving April 07, 2023 discontinued for suspicious disease progression. 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 Dewey. Last dose on 05/16/24.  11) Topotecan  1.2 mg/M2 on days 1-5 every 3 weeks with Cosela  240 Mg/M2 IV before the chemotherapy.  First dose January 11, 2024. Status post 8 cycles.     CURRENT THERAPY: Palliative systemic chemotherapy with carboplatin  on day 1 and etoposide  on days 1, 2, and 3 IV every 3 weeks.  First dose expected around 08/10/2024.  Dr. Sherrod will likely reduce the dose from cycle #1. He is status post 1 cycle.   INTERVAL HISTORY: Adam Wise 73 y.o. male returns to the clinic today for follow-up visit accompanied by his wife and sister. Discussed the use of AI scribe software for clinical note transcription with the patient, who gave verbal consent to proceed.  History of Present Illness Adam Wise is a 73 year old male with extensive stage small cell lung cancer who presents for evaluation before starting cycle three of a new chemotherapy regimen. He is accompanied by  his sister, Adam Wise.  He has extensive stage small cell lung cancer, initially diagnosed in April 2021. He has undergone several treatment and chemotherapy regimens and recently resumed treatment with a reduced dose. He is currently post two cycles of this treatment.  He recently had an intrathecal catheter and pump placement for pain management. The device was placed on Monday, and he just received the remote for it. The patient and family think it is working, but it was just placed.  He has  been receiving PRBC transfusions regularly due to chemotherapy-induced anemia. He received one unit of blood yesterday.  He is preparing for upcoming brain scans and radiation in the next two weeks. His last scan was on August 30, and he is scheduled for chest, abdomen, and pelvis scans in two weeks.  He has been experiencing muscle weakness, particularly in his legs, and has been discharged from physical therapy.     MEDICAL HISTORY: Past Medical History:  Diagnosis Date   Acute on chronic respiratory failure with hypoxia (HCC) 10/12/2021   Anxiety    Arthritis    Chickenpox    Chronic knee pain    Chronic low back pain    COPD exacerbation (HCC) 10/11/2021   COPD with exacerbation (HCC) 10/12/2021   Coronary artery calcification seen on CAT scan 11/2021   Coronary CTA 11/27/2021: Moderate to large right pleural effusion and compressive atelectasis right lung base. => Coronary Calcium Score 657.  Diffuse RCA calcification.  Minimal mild disease in the LAD and diagonal branches. == Overall limited study.  Notable artifact.   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 He has disease progression after the first line of chemotherapy in December 2021.   Testosterone  deficiency    Type 2 diabetes mellitus (HCC)     ALLERGIES:  is allergic to bupropion, hydrochlorothiazide, and lisinopril .  MEDICATIONS:  Current Outpatient Medications  Medication Sig Dispense Refill   albuterol  (VENTOLIN  HFA) 108  (90 Base) MCG/ACT inhaler Inhale 2 puffs into the lungs every 4 (four) hours as needed for shortness of breath. 18 each 0   B-D 3CC LUER-LOK SYR 22GX1 22G X 1 3 ML MISC USE AS INSTRUCTED FOR TESTOSTERONE  INJECTION EVERY 2 WEEKS 10 each 2   Black Pepper-Turmeric (TURMERIC COMPLEX/BLACK PEPPER PO) Take 1 tablet by mouth in the morning and at bedtime.     blood glucose meter kit and supplies KIT Dispense based on patient and insurance preference. Use up to four times daily as directed. 1 each 0   Calcium Carb-Cholecalciferol (CALCIUM PLUS VITAMIN D3 PO) Take 1 tablet by mouth in the morning and at bedtime.     Coenzyme Q10 (COQ10) 200 MG CAPS Take 200 mg by mouth daily.     Continuous Glucose Sensor (FREESTYLE LIBRE 3 PLUS SENSOR) MISC Use to check blood sugar continuously. Change sensor every 15 days. 6 each 1   cyclobenzaprine  (FLEXERIL ) 10 MG tablet Take 1 tablet (10 mg total) by mouth 2 (two) times daily as needed for muscle spasms.  20 tablet 0   docusate sodium  (COLACE) 100 MG capsule Take 1 capsule (100 mg total) by mouth every 12 (twelve) hours. (Patient taking differently: Take 100 mg by mouth daily as needed for moderate constipation.) 60 capsule 0   ELIQUIS  5 MG TABS tablet TAKE 1 TABLET BY MOUTH TWICE A DAY 180 tablet 2   esomeprazole (NEXIUM) 20 MG capsule Take 20 mg by mouth in the morning and at bedtime.     gabapentin  (NEURONTIN ) 300 MG capsule Take 1 capsule (300 mg total) by mouth 2 (two) times daily. For back pain. 180 capsule 1   glucose blood (ONETOUCH ULTRA) test strip USE UP TO 4 TIMES DAILY AS DIRECTED 400 strip 5   Insulin  Pen Needle (BD PEN NEEDLE NANO U/F) 32G X 4 MM MISC Use with insulin  as prescribed Dx Code: E11.9 100 each 3   ipratropium-albuterol  (DUONEB) 0.5-2.5 (3) MG/3ML SOLN Take 3 mLs by nebulization every 6 (six) hours as needed (SOB/Wheezing).     KRILL OIL PO Take 1 capsule by mouth daily.     Lancets MISC USE UP TO 3 TIMES DAILY AS DIRECTED 100 each 2    lidocaine  (LIDODERM ) 5 % Place 1 patch onto the skin daily. Remove & Discard patch within 12 hours or as directed by MD 30 patch 3   losartan  (COZAAR ) 25 MG tablet Take 1 tablet (25 mg total) by mouth daily. For blood pressure.     metFORMIN  (GLUCOPHAGE ) 1000 MG tablet TAKE 1 TABLET (1,000 MG TOTAL) BY MOUTH 2 (TWO) TIMES DAILY WITH A MEAL. FOR DIABETES 180 tablet 0   mirtazapine  (REMERON ) 15 MG tablet TAKE 1 TABLET BY MOUTH EVERYDAY AT BEDTIME 90 tablet 1   morphine  (MS CONTIN ) 30 MG 12 hr tablet Take 1 tablet (30 mg total) by mouth every 8 (eight) hours as needed. (Patient taking differently: Take 30 mg by mouth every 6 (six) hours as needed for pain.) 90 tablet 0   Multiple Vitamin (MULTI-VITAMINS) TABS Take 1 tablet by mouth daily with breakfast.     Naloxone  HCl 3 MG/0.1ML LIQD Place 1 spray into both nostrils once. For known/suspected opiod overdose. Every 2-3 minutes in alternating nostril till EMS arrives.     ondansetron  (ZOFRAN ) 8 MG tablet Take 1 tablet (8 mg total) by mouth every 8 (eight) hours as needed for nausea or vomiting. 30 tablet 3   oxyCODONE -acetaminophen  (PERCOCET) 10-325 MG tablet Take 1-1.5 tablets by mouth every 4 (four) hours as needed for pain. 90 tablet 0   pioglitazone  (ACTOS ) 45 MG tablet TAKE 1 TABLET (45 MG TOTAL) BY MOUTH DAILY. FOR DIABETES. 90 tablet 1   polyethylene glycol powder (GLYCOLAX /MIRALAX ) 17 GM/SCOOP powder Take 17 g by mouth daily as needed for moderate constipation or mild constipation.     pravastatin  (PRAVACHOL ) 40 MG tablet TAKE 1 TABLET BY MOUTH EVERY DAY IN THE EVENING FOR CHOLESTEROL 90 tablet 2   PRESCRIPTION MEDICATION CPAP- At bedtime     testosterone  cypionate (DEPOTESTOSTERONE CYPIONATE) 200 MG/ML injection Inject 200 mg into the muscle every 14 (fourteen) days.     No current facility-administered medications for this visit.   Facility-Administered Medications Ordered in Other Visits  Medication Dose Route Frequency Provider Last Rate  Last Admin   heparin  lock flush 100 unit/mL  500 Units Intracatheter Once Sherrod Sherrod, MD       sodium chloride  flush (NS) 0.9 % injection 10 mL  10 mL Intracatheter Once Sherrod Sherrod, MD  SURGICAL HISTORY:  Past Surgical History:  Procedure Laterality Date   CHEST TUBE INSERTION Right 01/20/2022   Procedure: CHEST TUBE INSERTION;  Surgeon: Claudene Toribio BROCKS, MD;  Location: [facility] ENDOSCOPY;  Service: Pulmonary;  Laterality: Right;  w/ Talc  Pleurodesis, planned admit for Obs afterwards   CHEST TUBE INSERTION Right 04/28/2022   Procedure: INSERTION PLEURAL DRAINAGE CATHETER - Pigtail tail, drainage;  Surgeon: Claudene Toribio BROCKS, MD;  Location: [facility] ENDOSCOPY;  Service: Pulmonary;  Laterality: Right;  talc  pleurodesis   CHEST TUBE INSERTION Right 09/01/2022   Procedure: INSERTION PLEURAL DRAINAGE CATHETER;  Surgeon: Alaine Vicenta NOVAK, MD;  Location: [facility] ENDOSCOPY;  Service: Cardiopulmonary;  Laterality: Right;  pigtail catheter placement with talc  pleurodesis   COLONOSCOPY WITH PROPOFOL  N/A 12/17/2018   Procedure: COLONOSCOPY WITH PROPOFOL ;  Surgeon: Janalyn Keene NOVAK, MD;  Location: [facility] ENDOSCOPY;  Service: Gastroenterology;  Laterality: N/A;   IR IMAGING GUIDED PORT INSERTION  04/17/2020   IR IVC FILTER RETRIEVAL / S&I /IMG GUID/MOD SED  09/19/2024   IR THORACENTESIS ASP PLEURAL SPACE W/IMG GUIDE  11/29/2021   IR THORACENTESIS ASP PLEURAL SPACE W/IMG GUIDE  01/03/2022   JOINT REPLACEMENT Bilateral    RADIOLOGY WITH ANESTHESIA N/A 09/19/2024   Procedure: RADIOLOGY WITH ANESTHESIA;  Surgeon: Radiologist, Medication, MD;  Location: [facility] ORS;  Service: Radiology;  Laterality: N/A;   REPLACEMENT TOTAL KNEE BILATERAL  2015   TALC  PLEURODESIS  09/01/2022   Procedure: TALC  PLEURADESIS;  Surgeon: Alaine Vicenta NOVAK, MD;  Location: [facility] ENDOSCOPY;  Service: Cardiopulmonary;;   TONSILLECTOMY  1960   TRANSTHORACIC ECHOCARDIOGRAM  05/2020   a) 05/2020: EF 55 to 60%.  No R WMA.  Mild LVH.  Indeterminate  LVEDP.  Unable to assess RVP.  Normal aortic and mitral valves.  Mildly elevated RAP.; b) 09/2021: EF 50-55%. No RWMA. Mild LVH. ~ LVEDP. Mild LA Dilation. NORMAL RV/RAP.  Normal MV/AoV.    REVIEW OF SYSTEMS:  Constitutional: positive for fatigue Eyes: negative Ears, nose, mouth, throat, and face: negative Respiratory: positive for dyspnea on exertion Cardiovascular: negative Gastrointestinal: negative Genitourinary:negative Integument/breast: negative Hematologic/lymphatic: negative Musculoskeletal:positive for back pain Neurological: negative Behavioral/Psych: negative Endocrine: negative Allergic/Immunologic: negative   PHYSICAL EXAMINATION: General appearance: alert, cooperative, fatigued, and no distress Head: Normocephalic, without obvious abnormality, atraumatic Neck: no adenopathy, no JVD, supple, symmetrical, trachea midline, and thyroid  not enlarged, symmetric, no tenderness/mass/nodules Lymph nodes: Cervical, supraclavicular, and axillary nodes normal. Resp: clear to auscultation bilaterally Back: symmetric, no curvature. ROM normal. No CVA tenderness. Cardio: regular rate and rhythm, S1, S2 normal, no murmur, click, rub or gallop GI: soft, non-tender; bowel sounds normal; no masses,  no organomegaly Extremities: extremities normal, atraumatic, no cyanosis or edema Neurologic: Alert and oriented X 3, normal strength and tone. Normal symmetric reflexes. Normal coordination and gait  ECOG PERFORMANCE STATUS: 1 - Symptomatic but completely ambulatory  Blood pressure 109/75, height 5' 9 (1.753 m), weight 225 lb (102.1 kg).  LABORATORY DATA: Lab Results  Component Value Date   WBC 2.7 (L) 09/20/2024   HGB 7.2 (L) 09/20/2024   HCT 23.0 (L) 09/20/2024   MCV 97.9 09/20/2024   PLT 181 09/20/2024      Chemistry      Component Value Date/Time   NA 135 09/19/2024 0745   K 3.8 09/19/2024 0745   CL 98 09/19/2024 0745   CO2 27 09/19/2024 0745   BUN 18 09/19/2024 0745    CREATININE 0.71 09/19/2024 0745   CREATININE 0.83 09/12/2024 1315   CREATININE 1.45 (H)  10/18/2021 1533      Component Value Date/Time   CALCIUM 8.9 09/19/2024 0745   ALKPHOS 198 (H) 09/19/2024 0745   AST 32 09/19/2024 0745   AST 28 09/12/2024 1315   ALT 25 09/19/2024 0745   ALT 26 09/12/2024 1315   BILITOT 0.4 09/19/2024 0745   BILITOT 0.4 09/12/2024 1315       RADIOGRAPHIC STUDIES: IR INTRATHECAL PUMP PLACEMENT Result Date: 09/19/2024 CLINICAL DATA:  GI bleed. EXAM: IR INTRATHECAL PUMP PLACEMENT ANESTHESIA/SEDATION: MAC per department of anesthesia MEDICATIONS: Lidocaine  1% subcutaneous CONTRAST:  None required PROCEDURE: Informed consent was obtained from the patient prior to the procedure, including potential complications of headache,allergy,and pain. Port pocket was discussed and marked on the right lower abdomen with the patient sitting. Patient placed left lateral decubitus and padded. A time-out was performed. Lumbar and right lower quadrant anterior abdominal surgical areas prepped with betadine, draped in usualsterile fashion. Under fluoroscopy, an appropriate skin entry site was determined for lumbar puncture. Region was infiltrated locally with 1% lidocaine . 16 gauge Touhy needle advanced into the thecal space under fluoroscopy at L2-3 from right parasagital approach. CSFspontaneously returned through the needle. Intrathecal catheter was advanced to the T9 level until resistance encountered. Small vertical incision was created below the needle. A 4-0-silk pursestring suture was placed deep subcutaneous around the needle which was then withdrawn and the suture secured to prevent CSF leak. The anchor device was then advanced over the catheter and secured in the deep subcutaneous tissues with two 4- 0 silk sutures. Attention was then turned to the port pocket. A 7 cm Transverse incision was made at the superior margin of the plannedport pocket. Subcutaneous pocket was created with a  combination of blunt and sharp dissection. Hemostasis was achieved. Test fit of thepump was performed. A tunneling device was used to create a tunnel from the lumbar incision to the pump pocket incision. CatheterPlacement details were recorded, and the catheter was passed through The tunneling device and secured. The catheter was then connected tothe implanted pump. The pump was Secured with 0 silk retention sutures at the 4 corners. Test Gentle aspiration returned CSF, confirming patency of the tubing. Wounds were closed in layers using deep and subcuticular 3-0 Monocryl, Dermabond applied, and dressings applied. The patienttolerated the procedure well. IMPRESSION:Technically successful placement of intrathecal catheter andimplanted pump for pain control. COMPLICATIONS: None immediate FINDINGS: Intrathecal catheter advanced to the T9 level.Technically successful tunneled catheter with implanted pump placement right lower quadrant. IMPRESSION: Technically successful placement of intrathecal catheter and implanted pump for pain control. Electronically Signed   By: JONETTA Faes M.D.   On: 09/19/2024 14:56   MR Brain W Wo Contrast Result Date: 09/07/2024 CLINICAL DATA:  Small-cell lung cancer EXAM: MRI HEAD WITHOUT AND WITH CONTRAST TECHNIQUE: Multiplanar, multiecho pulse sequences of the brain and surrounding structures were obtained without and with intravenous contrast. CONTRAST:  10mL GADAVIST  GADOBUTROL  1 MMOL/ML IV SOLN COMPARISON:  May 16, 2024 FINDINGS: MRI brain: Again noted are foci of restricted diffusion in the right inferior cerebellum and left medial parietal lobe. These are larger than on the prior study and have developed T2 hyperintensity and z both lesions measure approximate 1 cm. Enhancement. No other lesions are identified. There is no acute or chronic infarct. The ventricles are normal. There are normal flow signals in the carotid arteries and basilar artery. No significant bone marrow signal  abnormality. No significant abnormality in the paranasal sinuses or soft tissues. IMPRESSION: Lesions noted in the right  inferior cerebellum and medial left parietal lobe are larger, now each measuring about 1 cm. They have developed enhancement consistent with metastases. There are no new lesions. Electronically Signed   By: Nancyann Elting M.D.   On: 09/07/2024 10:13    ASSESSMENT AND PLAN: This is a very pleasant 73 year old Caucasian male diagnosed with extensive stage (T4, N3, M1C) small cell lung cancer presented with large right upper lobe lung mass in addition to mediastinal and right supraclavicular lymphadenopathy and multiple metastatic liver lesions diagnosed in April 2021. The patient initially underwent systemic chemotherapy with carboplatin  for an AUC of 5 on day 1, etoposide  100 mg per metered squared on days 1, 2, and 3 in addition to Cosela  for myeloprotection.  He also received immunotherapy with Imfinzi  on day 1 of every chemotherapy cycle.  He is status post 9 cycles.  Starting from cycle #5 he was on single agent immunotherapy with Imfinzi  IV every 4 weeks.  The patient had evidence of disease progression on his scan from December 2021.  He was then started on systemic chemotherapy with carboplatin  for an AUC of 5 on day 1, etoposide  100 mg per metered squared on days 1, 2, and 3 in addition to Tecentriq  1200 mg on day 1, the patient is status post 9 cycles.  Starting from cycle #5, the patient started maintenance immunotherapy with Tecentriq .  This was discontinued due to evidence of disease progression. The patient started third line treatment with Zepzelca  (lurbinectedin ) 3.2 Mg/M2 every 3-week status post 12 cycles of this treatment and he has been tolerating it fairly well except for fatigue.  This treatment was discontinued secondary to disease progression. He underwent fourth line treatment with systemic chemotherapy with irinotecan  65 Mg/M2 on days 1 and 8 every 3 weeks.  Status  post 3 cycles.  He tolerated this treatment well with no concerning adverse effect except for mild fatigue. Unfortunately his scan showed evidence for disease progression especially in the liver. I recommended for the patient to discontinue his current treatment with irinotecan  at this point. He started systemic chemotherapy again with carboplatin  for AUC of 5 on day 1, etoposide  100 Mg/M2 on days 1, 2 and 3 with Cosela  status post 15 cycles. I did reduce the dose of carboplatin  to AUC of 4 and etoposide  80 Mg/M2 starting from cycle #5.  He has been tolerating this treatment fairly well with no concerning complaints except for the increasing fatigue and weakness as well as the chemotherapy-induced pancytopenia. He had repeat CT scan of the chest, abdomen and pelvis performed recently.  I personally and independently reviewed the scan images and discussed the result and showed the images to the patient and his wife.  His scan showed redemonstration of the posttreatment/postradiation appearance of the right chest with dense fibrosis and consolidation of the perihilar right lung as well as a small loculated appearing right pleural effusion with associated pleural densities suggesting prior talc  pleurodesis.  There was significantly increased areas of masslike consolidation in the right lung for example in the peripheral right upper lobe and superior segment of the right lower lobe with extensive new associated nodularity concerning for local malignant recurrence. The patient is also more symptomatic with more dry cough and shortness of breath. I recommended for him to hold any additional treatment with chemotherapy for now. The patient was seen by Dr. Boyd at [facility] and treated with tarlatamab which is a by specific T-cell engager (BiTE) for the last 8 months.  Unfortunately he had evidence for disease progression after this treatment and it was discontinued. He started systemic chemotherapy with  Topotecan  1.2 mg/M2 on days 1-5 every 3 weeks with Cosela  240 Mg/M2 IV before the chemotherapy.  First dose January 04, 2024.  Status post 8 cycles.  This treatment was discontinued secondary to disease progression and the patient started palliative systemic chemotherapy again with reduced dose carboplatin  and [place] side with Cosela  before the chemotherapy status post 2 cycles.  He is here today for evaluation before starting day 1 of cycle #3.  Assessment and Plan Assessment & Plan Extensive stage small cell lung cancer Extensive stage small cell lung cancer, initially diagnosed in April 2021. Currently undergoing chemotherapy with a reduced dose regimen. He is post two cycles of the current treatment and is preparing for cycle three. No recent scans since August 13, 2024. Brain scans are planned in preparation for upcoming radiation therapy. - Proceed with cycle three of chemotherapy today - Order chest, abdomen, and pelvis CT scan in two weeks - Plan for brain scans in preparation for radiation therapy in the next two weeks  Chemotherapy-induced anemia Chemotherapy-induced anemia managed with regular PRBC transfusions. Recent transfusion of one unit of blood. Current blood counts show good hemoglobin levels and white blood cell count.  Chronic cancer-related pain managed with intrathecal pump Chronic cancer-related pain managed with a recently placed intrathecal pump. Pain control appears to be improving with the new device, though it was only initiated on Monday.  Deconditioning and muscle weakness Deconditioning and muscle weakness noted, particularly in the legs. He had previously discontinued physical therapy but is encouraged to resume to prevent further decline in muscle strength and ambulation ability. - Encourage resumption of physical therapy to improve muscle strength and ambulation The patient was advised to call immediately if she has any concerning symptoms in the interval. The  patient voices understanding of current disease status and treatment options and is in agreement with the current care plan.  All questions were answered. The patient knows to call the clinic with any problems, questions or concerns. We can certainly see the patient much sooner if necessary. The total time spent in the appointment was 30 minutes including review of chart and various tests results, discussions about plan of care and coordination of care plan .   Disclaimer: This note was dictated with voice recognition software. Similar sounding words can inadvertently be transcribed and may not be corrected upon review.

## 2024-09-21 NOTE — Progress Notes (Addendum)
 Ok to continue Cosela  600mg  (rounded for vial size) today.  Decrease Etoposide  dose (80 mg/m2) based on most recent weight.  Bridgett Leach [place], [place], BCPS, BCOP 09/21/2024 12:57 PM

## 2024-09-21 NOTE — Patient Instructions (Signed)
 [facility] - A DEPT OF [facility]  Discharge Instructions: Thank you for choosing [facility] to provide your oncology and hematology care.   If you have a lab appointment with the Cancer Center, please go directly to the Cancer Center and check in at the registration area.   Wear comfortable clothing and clothing appropriate for easy access to any Portacath or PICC line.   We strive to give you quality time with your provider. You may need to reschedule your appointment if you arrive late (15 or more minutes).  Arriving late affects you and other patients whose appointments are after yours.  Also, if you miss three or more appointments without notifying the office, you may be dismissed from the clinic at the provider's discretion.      For prescription refill requests, have your pharmacy contact our office and allow 72 hours for refills to be completed.    Today you received the following chemotherapy and/or immunotherapy agents: Etoposide  (Vepesid ) & Cosela     To help prevent nausea and vomiting after your treatment, we encourage you to take your nausea medication as directed.  BELOW ARE SYMPTOMS THAT SHOULD BE REPORTED IMMEDIATELY: *FEVER GREATER THAN 100.4 F (38 C) OR HIGHER *CHILLS OR SWEATING *NAUSEA AND VOMITING THAT IS NOT CONTROLLED WITH YOUR NAUSEA MEDICATION *UNUSUAL SHORTNESS OF BREATH *UNUSUAL BRUISING OR BLEEDING *URINARY PROBLEMS (pain or burning when urinating, or frequent urination) *BOWEL PROBLEMS (unusual diarrhea, constipation, pain near the anus) TENDERNESS IN MOUTH AND THROAT WITH OR WITHOUT PRESENCE OF ULCERS (sore throat, sores in mouth, or a toothache) UNUSUAL RASH, SWELLING OR PAIN  UNUSUAL VAGINAL DISCHARGE OR ITCHING   Items with * indicate a potential emergency and should be followed up as soon as possible or go to the Emergency Department if any problems should occur.  Please show the CHEMOTHERAPY ALERT CARD  or IMMUNOTHERAPY ALERT CARD at check-in to the Emergency Department and triage nurse.  Should you have questions after your visit or need to cancel or reschedule your appointment, please contact [facility] - A DEPT OF [facility]  Dept: [phone]  and follow the prompts.  Office hours are 8:00 a.m. to 4:30 p.m. Monday - Friday. Please note that voicemails left after 4:00 p.m. may not be returned until the following business day.  We are closed weekends and major holidays. You have access to a nurse at all times for urgent questions. Please call the main number to the clinic Dept: [phone] and follow the prompts.   For any non-urgent questions, you may also contact your provider using MyChart. We now offer e-Visits for anyone 28 and older to request care online for non-urgent symptoms. For details visit [email or web address].   Also download the MyChart app! Go to the app store, search MyChart, open the app, select [facility], and log in with your MyChart username and password.

## 2024-09-21 NOTE — Telephone Encounter (Signed)
 Pharmacy Patient Advocate Encounter  Received notification from [facility] that Prior Authorization for [facility] 3 plus sensor has been DENIED.  Full denial letter will be uploaded to the media tab. See denial reason below.   Must be filled with patients Medicare Part B

## 2024-09-22 ENCOUNTER — Encounter: Payer: Self-pay | Admitting: Internal Medicine

## 2024-09-22 ENCOUNTER — Other Ambulatory Visit: Payer: Self-pay | Admitting: Radiation Oncology

## 2024-09-22 ENCOUNTER — Inpatient Hospital Stay

## 2024-09-22 VITALS — BP 108/76 | HR 108 | Temp 97.7°F | Resp 18

## 2024-09-22 DIAGNOSIS — Z5111 Encounter for antineoplastic chemotherapy: Secondary | ICD-10-CM | POA: Diagnosis not present

## 2024-09-22 DIAGNOSIS — C3411 Malignant neoplasm of upper lobe, right bronchus or lung: Secondary | ICD-10-CM | POA: Diagnosis not present

## 2024-09-22 DIAGNOSIS — G893 Neoplasm related pain (acute) (chronic): Secondary | ICD-10-CM | POA: Diagnosis not present

## 2024-09-22 DIAGNOSIS — Z515 Encounter for palliative care: Secondary | ICD-10-CM | POA: Diagnosis not present

## 2024-09-22 DIAGNOSIS — C787 Secondary malignant neoplasm of liver and intrahepatic bile duct: Secondary | ICD-10-CM | POA: Diagnosis not present

## 2024-09-22 DIAGNOSIS — C7951 Secondary malignant neoplasm of bone: Secondary | ICD-10-CM | POA: Diagnosis not present

## 2024-09-22 MED ORDER — DEXAMETHASONE SOD PHOSPHATE PF 10 MG/ML IJ SOLN
10.0000 mg | Freq: Once | INTRAMUSCULAR | Status: AC
  Administered 2024-09-22: 10 mg via INTRAVENOUS

## 2024-09-22 MED ORDER — TRILACICLIB DIHYDROCHLORIDE INJECTION 300 MG
240.0000 mg/m2 | Freq: Once | INTRAVENOUS | Status: AC
  Administered 2024-09-22: 600 mg via INTRAVENOUS
  Filled 2024-09-22: qty 40

## 2024-09-22 MED ORDER — SODIUM CHLORIDE 0.9% FLUSH: 10.0000 mL | INTRAVENOUS | Status: DC | PRN

## 2024-09-22 MED ORDER — SODIUM CHLORIDE 0.9 % IV SOLN: INTRAVENOUS | Status: DC

## 2024-09-22 MED ORDER — SODIUM CHLORIDE 0.9 % IV SOLN
80.0000 mg/m2 | Freq: Once | INTRAVENOUS | Status: AC
  Administered 2024-09-22: 178 mg via INTRAVENOUS
  Filled 2024-09-22: qty 8.9

## 2024-09-22 NOTE — Patient Instructions (Signed)
 [facility] - A DEPT OF [facility]  Discharge Instructions: Thank you for choosing [facility] to provide your oncology and hematology care.   If you have a lab appointment with the Cancer Center, please go directly to the Cancer Center and check in at the registration area.   Wear comfortable clothing and clothing appropriate for easy access to any Portacath or PICC line.   We strive to give you quality time with your provider. You may need to reschedule your appointment if you arrive late (15 or more minutes).  Arriving late affects you and other patients whose appointments are after yours.  Also, if you miss three or more appointments without notifying the office, you may be dismissed from the clinic at the provider's discretion.      For prescription refill requests, have your pharmacy contact our office and allow 72 hours for refills to be completed.    Today you received the following chemotherapy and/or immunotherapy agents: Etoposide  (Vepesid ) & Cosela     To help prevent nausea and vomiting after your treatment, we encourage you to take your nausea medication as directed.  BELOW ARE SYMPTOMS THAT SHOULD BE REPORTED IMMEDIATELY: *FEVER GREATER THAN 100.4 F (38 C) OR HIGHER *CHILLS OR SWEATING *NAUSEA AND VOMITING THAT IS NOT CONTROLLED WITH YOUR NAUSEA MEDICATION *UNUSUAL SHORTNESS OF BREATH *UNUSUAL BRUISING OR BLEEDING *URINARY PROBLEMS (pain or burning when urinating, or frequent urination) *BOWEL PROBLEMS (unusual diarrhea, constipation, pain near the anus) TENDERNESS IN MOUTH AND THROAT WITH OR WITHOUT PRESENCE OF ULCERS (sore throat, sores in mouth, or a toothache) UNUSUAL RASH, SWELLING OR PAIN  UNUSUAL VAGINAL DISCHARGE OR ITCHING   Items with * indicate a potential emergency and should be followed up as soon as possible or go to the Emergency Department if any problems should occur.  Please show the CHEMOTHERAPY ALERT CARD  or IMMUNOTHERAPY ALERT CARD at check-in to the Emergency Department and triage nurse.  Should you have questions after your visit or need to cancel or reschedule your appointment, please contact [facility] - A DEPT OF [facility]  Dept: [phone]  and follow the prompts.  Office hours are 8:00 a.m. to 4:30 p.m. Monday - Friday. Please note that voicemails left after 4:00 p.m. may not be returned until the following business day.  We are closed weekends and major holidays. You have access to a nurse at all times for urgent questions. Please call the main number to the clinic Dept: [phone] and follow the prompts.   For any non-urgent questions, you may also contact your provider using MyChart. We now offer e-Visits for anyone 28 and older to request care online for non-urgent symptoms. For details visit [email or web address].   Also download the MyChart app! Go to the app store, search MyChart, open the app, select [facility], and log in with your MyChart username and password.

## 2024-09-22 NOTE — Telephone Encounter (Signed)
 See other refill encounter, patient notified.

## 2024-09-23 ENCOUNTER — Other Ambulatory Visit: Payer: Self-pay | Admitting: Primary Care

## 2024-09-23 ENCOUNTER — Other Ambulatory Visit (HOSPITAL_COMMUNITY): Payer: Self-pay

## 2024-09-23 ENCOUNTER — Inpatient Hospital Stay

## 2024-09-23 VITALS — BP 128/81 | HR 98 | Temp 97.8°F | Resp 18

## 2024-09-23 DIAGNOSIS — C7951 Secondary malignant neoplasm of bone: Secondary | ICD-10-CM | POA: Diagnosis not present

## 2024-09-23 DIAGNOSIS — C3411 Malignant neoplasm of upper lobe, right bronchus or lung: Secondary | ICD-10-CM | POA: Diagnosis not present

## 2024-09-23 DIAGNOSIS — Z515 Encounter for palliative care: Secondary | ICD-10-CM | POA: Diagnosis not present

## 2024-09-23 DIAGNOSIS — Z5111 Encounter for antineoplastic chemotherapy: Secondary | ICD-10-CM | POA: Diagnosis not present

## 2024-09-23 DIAGNOSIS — C787 Secondary malignant neoplasm of liver and intrahepatic bile duct: Secondary | ICD-10-CM | POA: Diagnosis not present

## 2024-09-23 DIAGNOSIS — G893 Neoplasm related pain (acute) (chronic): Secondary | ICD-10-CM | POA: Diagnosis not present

## 2024-09-23 MED ORDER — SODIUM CHLORIDE 0.9 % IV SOLN
80.0000 mg/m2 | Freq: Once | INTRAVENOUS | Status: AC
  Administered 2024-09-23: 178 mg via INTRAVENOUS
  Filled 2024-09-23: qty 8.9

## 2024-09-23 MED ORDER — FREESTYLE LIBRE 3 PLUS SENSOR MISC: 1 refills | Status: DC

## 2024-09-23 MED ORDER — DEXAMETHASONE SOD PHOSPHATE PF 10 MG/ML IJ SOLN
10.0000 mg | Freq: Once | INTRAMUSCULAR | Status: AC
  Administered 2024-09-23: 10 mg via INTRAVENOUS

## 2024-09-23 MED ORDER — SODIUM CHLORIDE 0.9 % IV SOLN: INTRAVENOUS | Status: DC

## 2024-09-23 MED ORDER — TRILACICLIB DIHYDROCHLORIDE INJECTION 300 MG
240.0000 mg/m2 | Freq: Once | INTRAVENOUS | Status: AC
  Administered 2024-09-23: 600 mg via INTRAVENOUS
  Filled 2024-09-23: qty 40

## 2024-09-23 NOTE — Patient Instructions (Signed)
 [facility] - A DEPT OF [facility]  Discharge Instructions: Thank you for choosing [facility] to provide your oncology and hematology care.   If you have a lab appointment with the Cancer Center, please go directly to the Cancer Center and check in at the registration area.   Wear comfortable clothing and clothing appropriate for easy access to any Portacath or PICC line.   We strive to give you quality time with your provider. You may need to reschedule your appointment if you arrive late (15 or more minutes).  Arriving late affects you and other patients whose appointments are after yours.  Also, if you miss three or more appointments without notifying the office, you may be dismissed from the clinic at the provider's discretion.      For prescription refill requests, have your pharmacy contact our office and allow 72 hours for refills to be completed.    Today you received the following chemotherapy and/or immunotherapy agents: Etoposide  (Vepesid ) & Cosela     To help prevent nausea and vomiting after your treatment, we encourage you to take your nausea medication as directed.  BELOW ARE SYMPTOMS THAT SHOULD BE REPORTED IMMEDIATELY: *FEVER GREATER THAN 100.4 F (38 C) OR HIGHER *CHILLS OR SWEATING *NAUSEA AND VOMITING THAT IS NOT CONTROLLED WITH YOUR NAUSEA MEDICATION *UNUSUAL SHORTNESS OF BREATH *UNUSUAL BRUISING OR BLEEDING *URINARY PROBLEMS (pain or burning when urinating, or frequent urination) *BOWEL PROBLEMS (unusual diarrhea, constipation, pain near the anus) TENDERNESS IN MOUTH AND THROAT WITH OR WITHOUT PRESENCE OF ULCERS (sore throat, sores in mouth, or a toothache) UNUSUAL RASH, SWELLING OR PAIN  UNUSUAL VAGINAL DISCHARGE OR ITCHING   Items with * indicate a potential emergency and should be followed up as soon as possible or go to the Emergency Department if any problems should occur.  Please show the CHEMOTHERAPY ALERT CARD  or IMMUNOTHERAPY ALERT CARD at check-in to the Emergency Department and triage nurse.  Should you have questions after your visit or need to cancel or reschedule your appointment, please contact [facility] - A DEPT OF [facility]  Dept: [phone]  and follow the prompts.  Office hours are 8:00 a.m. to 4:30 p.m. Monday - Friday. Please note that voicemails left after 4:00 p.m. may not be returned until the following business day.  We are closed weekends and major holidays. You have access to a nurse at all times for urgent questions. Please call the main number to the clinic Dept: [phone] and follow the prompts.   For any non-urgent questions, you may also contact your provider using MyChart. We now offer e-Visits for anyone 28 and older to request care online for non-urgent symptoms. For details visit [email or web address].   Also download the MyChart app! Go to the app store, search MyChart, open the app, select [facility], and log in with your MyChart username and password.

## 2024-09-23 NOTE — Progress Notes (Signed)
 Cosela  600 mg dose okay due to vial size rounding.  Harlene Nasuti, PharmD Oncology Infusion Pharmacist 09/23/2024 2:04 PM

## 2024-09-23 NOTE — Telephone Encounter (Signed)
 Patient's wife updated.

## 2024-09-23 NOTE — Telephone Encounter (Signed)
 Pharmacy Patient Advocate Encounter   Received notification from Physician's Office that prior authorization for [facility] 3 plus sensors is required/requested.   Insurance verification completed.   The patient is insured through [facility].   Per test claim: Medication is not eligible for pharmacy benefits and must be billed through medical insurance. As our team only handles pharmacy related prior auths, medical PA's must be submitted by the clinic. Thank you   Spoke with Almarie at [facility] If pharmacy is unable to bill thru Part B, patient must fill as DME medical supply.

## 2024-09-23 NOTE — Telephone Encounter (Signed)
 Called and spoke with [facility], they have processed it and states their [facility] department will be faxing over a document to be filled out and return. This could take 24-72 hours.

## 2024-09-23 NOTE — Telephone Encounter (Signed)
 See other refill encounter.

## 2024-09-23 NOTE — Telephone Encounter (Signed)
 Moria,   I spoke with the pharmacy this morning and advised them that patients insulin  was not on list, they attempted to rerun through Medicare Part B as insulin  dependent and the message he got said documentation was needed, but it did not specify what kind of documentation. The number it gave to contact the third party was [phone].

## 2024-09-23 NOTE — Telephone Encounter (Signed)
 Can we find out what's going on with his Freestyle [place]? Seems like we need to complete some paperwork?

## 2024-09-23 NOTE — Telephone Encounter (Signed)
 Spoke to pharmacy again, states they are unable to fill under Medicare Part B. Spoke with Medical supply store per [place] message they stated medical supply stores do not cary any CGMs due to it having a needle has to be from pharmacy.   [facility] and perhaps it will go through for them. Called and advised patients wife of all this, she is agreeable to trying [facility].   Please sent Freestyle Libre to [facility] on [address], once sent I will call and find out if they can process under Medicare Part B.

## 2024-09-23 NOTE — Telephone Encounter (Signed)
 Noted.  New prescription sent to [facility] pharmacy.

## 2024-09-26 ENCOUNTER — Ambulatory Visit (HOSPITAL_COMMUNITY)

## 2024-09-26 ENCOUNTER — Inpatient Hospital Stay

## 2024-09-26 ENCOUNTER — Telehealth: Payer: Self-pay | Admitting: Nurse Practitioner

## 2024-09-26 ENCOUNTER — Encounter (HOSPITAL_COMMUNITY): Payer: Self-pay | Admitting: Radiology

## 2024-09-26 ENCOUNTER — Other Ambulatory Visit: Payer: Self-pay | Admitting: Nurse Practitioner

## 2024-09-26 ENCOUNTER — Encounter: Payer: Self-pay | Admitting: Internal Medicine

## 2024-09-26 ENCOUNTER — Inpatient Hospital Stay: Admitting: Nurse Practitioner

## 2024-09-26 VITALS — BP 112/72 | HR 127 | Temp 97.2°F | Resp 20

## 2024-09-26 DIAGNOSIS — Z5111 Encounter for antineoplastic chemotherapy: Secondary | ICD-10-CM | POA: Diagnosis not present

## 2024-09-26 DIAGNOSIS — C3411 Malignant neoplasm of upper lobe, right bronchus or lung: Secondary | ICD-10-CM

## 2024-09-26 DIAGNOSIS — C3490 Malignant neoplasm of unspecified part of unspecified bronchus or lung: Secondary | ICD-10-CM | POA: Diagnosis not present

## 2024-09-26 DIAGNOSIS — G893 Neoplasm related pain (acute) (chronic): Secondary | ICD-10-CM

## 2024-09-26 DIAGNOSIS — C787 Secondary malignant neoplasm of liver and intrahepatic bile duct: Secondary | ICD-10-CM | POA: Diagnosis not present

## 2024-09-26 DIAGNOSIS — I10 Essential (primary) hypertension: Secondary | ICD-10-CM | POA: Diagnosis not present

## 2024-09-26 DIAGNOSIS — Z515 Encounter for palliative care: Secondary | ICD-10-CM | POA: Diagnosis not present

## 2024-09-26 DIAGNOSIS — R630 Anorexia: Secondary | ICD-10-CM | POA: Diagnosis not present

## 2024-09-26 DIAGNOSIS — R530 Neoplastic (malignant) related fatigue: Secondary | ICD-10-CM

## 2024-09-26 DIAGNOSIS — C7951 Secondary malignant neoplasm of bone: Secondary | ICD-10-CM | POA: Diagnosis not present

## 2024-09-26 LAB — CBC WITH DIFFERENTIAL (CANCER CENTER ONLY)
Abs Immature Granulocytes: 0.05 K/uL (ref 0.00–0.07)
Basophils Absolute: 0 K/uL (ref 0.0–0.1)
Basophils Relative: 0 %
Eosinophils Absolute: 0 K/uL (ref 0.0–0.5)
Eosinophils Relative: 0 %
HCT: 29.3 % — ABNORMAL LOW (ref 39.0–52.0)
Hemoglobin: 9.6 g/dL — ABNORMAL LOW (ref 13.0–17.0)
Immature Granulocytes: 1 %
Lymphocytes Relative: 5 %
Lymphs Abs: 0.3 K/uL — ABNORMAL LOW (ref 0.7–4.0)
MCH: 30.3 pg (ref 26.0–34.0)
MCHC: 32.8 g/dL (ref 30.0–36.0)
MCV: 92.4 fL (ref 80.0–100.0)
Monocytes Absolute: 0.1 K/uL (ref 0.1–1.0)
Monocytes Relative: 2 %
Neutro Abs: 4.9 K/uL (ref 1.7–7.7)
Neutrophils Relative %: 92 %
Platelet Count: 191 K/uL (ref 150–400)
RBC: 3.17 MIL/uL — ABNORMAL LOW (ref 4.22–5.81)
RDW: 17.5 % — ABNORMAL HIGH (ref 11.5–15.5)
Smear Review: NORMAL
WBC Count: 5.4 K/uL (ref 4.0–10.5)
nRBC: 0 % (ref 0.0–0.2)

## 2024-09-26 LAB — CMP (CANCER CENTER ONLY)
ALT: 18 U/L (ref 0–44)
AST: 27 U/L (ref 15–41)
Albumin: 3.3 g/dL — ABNORMAL LOW (ref 3.5–5.0)
Alkaline Phosphatase: 122 U/L (ref 38–126)
Anion gap: 8 (ref 5–15)
BUN: 18 mg/dL (ref 8–23)
CO2: 30 mmol/L (ref 22–32)
Calcium: 9.1 mg/dL (ref 8.9–10.3)
Chloride: 98 mmol/L (ref 98–111)
Creatinine: 0.71 mg/dL (ref 0.61–1.24)
GFR, Estimated: >60 mL/min (ref >60)
Glucose, Bld: 156 mg/dL — ABNORMAL HIGH (ref 70–99)
Potassium: 4 mmol/L (ref 3.5–5.1)
Sodium: 136 mmol/L (ref 135–145)
Total Bilirubin: 0.6 mg/dL (ref 0.0–1.2)
Total Protein: 6 g/dL — ABNORMAL LOW (ref 6.5–8.1)

## 2024-09-26 NOTE — Progress Notes (Signed)
 Palliative Medicine [facility]  [phone] [phone]   Name: Adam Wise Date: 09/26/2024 MRN: [id number]  DOB: 29-Oct-1951  Patient Care Team: Adam Comer POUR, NP as PCP - General (Internal Medicine) Adam Alm ORN, MD as PCP - Cardiology (Cardiology) Adam Lonell BROCKS, RN as Oncology Nurse Navigator Pickenpack-Cousar, Adam SAILOR, NP as Nurse Practitioner (Hospice and Palliative Medicine) Heilingoetter, Calton CROME, PA-C as Physician Assistant (Physician Assistant) Adam Lonni Ingle, NP as Nurse Practitioner (Cardiology)    INTERVAL HISTORY: Adam Wise is a 73 y.o. 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 Palliative ask to see for symptom management and goals of care.   SOCIAL HISTORY:     reports that he has quit smoking. He has never been exposed to tobacco smoke. He has never used smokeless tobacco. He reports that he does not currently use alcohol. He reports that he does not currently use drugs.  ADVANCE DIRECTIVES:  Advanced directives on file naming Adam Wise, Adam Wise, Adam Wise, and Adam Wise as decision makers for this pt should Adam Wise become unable to speak for himself.   CODE STATUS: Full code  PAST MEDICAL HISTORY: Past Medical History:  Diagnosis Date   Acute on chronic respiratory failure with hypoxia (HCC) 10/12/2021   Anxiety    Arthritis    Chickenpox    Chronic knee pain    Chronic low back pain    COPD exacerbation (HCC) 10/11/2021   COPD with exacerbation (HCC) 10/12/2021   Coronary artery calcification seen on CAT scan 11/2021   Coronary CTA 11/27/2021: Moderate to large right pleural effusion and compressive atelectasis right lung base. => Coronary Calcium Score 657.  Diffuse RCA calcification.  Minimal mild disease in the LAD and diagonal branches. == Overall limited study.  Notable  artifact.   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 He has disease progression after the first line of chemotherapy in December 2021.   Testosterone  deficiency    Type 2 diabetes mellitus (HCC)     ALLERGIES:  is allergic to bupropion, hydrochlorothiazide, and lisinopril .  MEDICATIONS:  Current Outpatient Medications  Medication Sig Dispense Refill   albuterol  (VENTOLIN  HFA) 108 (90 Base) MCG/ACT inhaler Inhale 2 puffs into the lungs every 4 (four) hours as needed for shortness of breath. 18 each 0   B-D 3CC LUER-LOK SYR 22GX1 22G X 1 3 ML MISC USE AS INSTRUCTED FOR TESTOSTERONE  INJECTION EVERY 2 WEEKS 10 each 2   Black Pepper-Turmeric (TURMERIC COMPLEX/BLACK PEPPER PO) Take 1 tablet by mouth in the morning and at bedtime.     blood glucose meter kit and supplies KIT Dispense based on patient and insurance preference. Use up to four times daily as directed. 1 each 0   Calcium Carb-Cholecalciferol (CALCIUM PLUS VITAMIN D3 PO) Take 1 tablet by mouth in the morning and at bedtime.     Coenzyme Q10 (COQ10) 200 MG CAPS Take 200  mg by mouth daily.     Continuous Glucose Sensor (FREESTYLE LIBRE 3 PLUS SENSOR) MISC Use to check blood sugar continuously. Change sensor every 15 days. 6 each 1   cyclobenzaprine  (FLEXERIL ) 10 MG tablet Take 1 tablet (10 mg total) by mouth 2 (two) times daily as needed for muscle spasms. 20 tablet 0   docusate sodium  (COLACE) 100 MG capsule Take 1 capsule (100 mg total) by  mouth every 12 (twelve) hours. (Patient taking differently: Take 100 mg by mouth daily as needed for moderate constipation.) 60 capsule 0   ELIQUIS  5 MG TABS tablet TAKE 1 TABLET BY MOUTH TWICE A DAY 180 tablet 2   esomeprazole (NEXIUM) 20 MG capsule Take 20 mg by mouth in the morning and at bedtime.     gabapentin  (NEURONTIN ) 300 MG capsule Take 1 capsule (300 mg total) by mouth 2 (two) times daily. For back pain. 180 capsule 1   glucose blood (ONETOUCH ULTRA) test strip USE UP TO 4 TIMES DAILY AS DIRECTED 400 strip 5   insulin  aspart (NOVOLOG  FLEXPEN) 100 UNIT/ML FlexPen Inject 1-9 Units into the skin 3 (three) times daily with meals as needed for high blood sugar (while on chemotherapy).     Insulin  Pen Needle (BD PEN NEEDLE NANO U/F) 32G X 4 MM MISC Use with insulin  as prescribed Dx Code: E11.9 100 each 3   ipratropium-albuterol  (DUONEB) 0.5-2.5 (3) MG/3ML SOLN Take 3 mLs by nebulization every 6 (six) hours as needed (SOB/Wheezing).     KRILL OIL PO Take 1 capsule by mouth daily.     Lancets MISC USE UP TO 3 TIMES DAILY AS DIRECTED 100 each 2   lidocaine  (LIDODERM ) 5 % Place 1 patch onto the skin daily. Remove & Discard patch within 12 hours or as directed by MD 30 patch 3   metFORMIN  (GLUCOPHAGE ) 1000 MG tablet TAKE 1 TABLET (1,000 MG TOTAL) BY MOUTH 2 (TWO) TIMES DAILY WITH A MEAL. FOR DIABETES 180 tablet 0   mirtazapine  (REMERON ) 15 MG tablet TAKE 1 TABLET BY MOUTH EVERYDAY AT BEDTIME 90 tablet 1   morphine  (MS CONTIN ) 30 MG 12 hr tablet Take 1 tablet (30 mg total) by mouth every 8 (eight) hours as needed. (Patient taking differently: Take 30 mg by mouth every 6 (six) hours as needed for pain.) 90 tablet 0   Multiple Vitamin (MULTI-VITAMINS) TABS Take 1 tablet by mouth daily with breakfast.     Naloxone  HCl 3 MG/0.1ML LIQD Place 1 spray into both nostrils once. For known/suspected opiod overdose. Every 2-3 minutes in alternating nostril till EMS arrives.     ondansetron  (ZOFRAN ) 8 MG tablet  Take 1 tablet (8 mg total) by mouth every 8 (eight) hours as needed for nausea or vomiting. 30 tablet 3   oxyCODONE -acetaminophen  (PERCOCET) 10-325 MG tablet Take 1-1.5 tablets by mouth every 4 (four) hours as needed for pain. 90 tablet 0   pioglitazone  (ACTOS ) 45 MG tablet TAKE 1 TABLET (45 MG TOTAL) BY MOUTH DAILY. FOR DIABETES. 90 tablet 1   polyethylene glycol powder (GLYCOLAX /MIRALAX ) 17 GM/SCOOP powder Take 17 g by mouth daily as needed for moderate constipation or mild constipation.     pravastatin  (PRAVACHOL ) 40 MG tablet TAKE 1 TABLET BY MOUTH EVERY DAY IN THE EVENING FOR CHOLESTEROL 90 tablet 2   PRESCRIPTION MEDICATION CPAP- At bedtime     testosterone  cypionate (DEPOTESTOSTERONE CYPIONATE) 200 MG/ML injection Inject 200 mg into the muscle every 14 (fourteen) days.     No  current facility-administered medications for this visit.   Facility-Administered Medications Ordered in Other Visits  Medication Dose Route Frequency Provider Last Rate Last Admin   heparin  lock flush 100 unit/mL  500 Units Intracatheter Once Sherrod Sherrod, MD       sodium chloride  flush (NS) 0.9 % injection 10 mL  10 mL Intracatheter Once Sherrod Sherrod, MD        VITAL SIGNS: BP 112/72   Pulse (!) 127   Temp (!) 97.2 F (36.2 C)   Resp 20   SpO2 96%  Filed Weights    Estimated body mass index is 33.23 kg/m as calculated from the following:   Height as of 09/21/24: 5' 9 (1.753 m).   Weight as of 09/21/24: 225 lb (102.1 kg).   PERFORMANCE STATUS (ECOG) : 1 - Symptomatic but completely ambulatory   Physical Exam General: NAD Cardiovascular: regular rate and rhythm Pulmonary: normal breathing pattern Extremities: no edema, no joint deformities Skin: no rashes. Lower back insertion site, clean and healing, no redness, right abdomen site clean and dry, dressing removed, steri strips applied.  Neurological: AAO x3  IMPRESSION: Discussed the use of AI scribe software for clinical note  transcription with the patient, who gave verbal consent to proceed.  History of Present Illness Adam Wise is a 73 year old male with lung cancer who presented to clinic for symptom management follow-up post intrathecal pump placement.  Denies concerns of nausea, vomiting, or diarrhea.    He experiences increased urination at night, describing it as 'peeing all night.' This symptom has occurred intermittently in the past. No difficulty urinating, burning sensation, or signs of a urinary tract infection are present. He is concerned about potential dehydration due to this symptom. We discussed increasing his fluid intake.   Patient has a cough that primarily occurs at night, starting about a week ago. The cough is productive of loose, clear mucus. No nasal congestion or drainage is reported. He has not used any cough medicine recently. Denies fever. Encouraged use of warm teas with lemon, humidifier, and OTC cough syrup to manage evening cough. They are aware of parameters to contact office. COVID test negative.   He mentions a lack of appetite, noting reduced food intake, although he ate well during a recent family visit to [employer]. We discussed importance of oral nutrition and focusing on small frequent meals. He experiences constipation, having bowel movements every two to three days. He takes two stool softeners daily to manage this issue. Miralax  and Senna 2 tablets. Encouraged patient to increase to twice daily. He reports not taking medication consistently each day.   We discussed his pain at length. His pump is delivering morphine  0.5 mg per hour continuously, totaling about 12 mg over 24 hours. PTM dosing at 0.02mg  every 6 hours as needed. He is tolerating and managing dosing without difficulty. He is having some discomfort this morning stating he has not had a chance to administer a bolus. Pump interrogation shows 3 total bolus uses. Mr. Schrieber has not required any oral medications  over the past 4 days. This is a huge difference in his level of pain and discomfort compared to two weeks prior. He and wife are extremely appreciative. Education provided on continued pain relief. We will continue to closely monitor pain and adjust pump dosing accordingly.   Patient and wife speaks to anxiously preparing for upcoming brain MRI and possible SRS. He is taking things one day at a time.   We  will continue to closely monitor and adjust accordingly.  All questions answered and support provided.  I discussed the importance of continued conversation with family and their medical providers regarding overall plan of care and treatment options, ensuring decisions are within the context of the patients values and GOCs. Assessment & Plan Cancer pain Cancer related pain with significant improvement in pain levels since the initiation of the intrathecal morphine  pump. The morphine  pump is delivering 0.5 mg of morphine  per hour, equivalent to approximately 12 mg over a 24-hour period. No recent use of oral pain medications, showing significant improvement from previous management. - Continue current intrathecal pump regimen - Monitor pain levels and bolus usage - Reassess pain management in one week - Utilize the bolus feature (PTM) on the pump for breakthrough pain, allowing a bolus every six hours as needed. - Pump settings remains at 0.5 mg morphine  per hour and 0.02 mg morphine  bolus every 6 hours as needed with a lockout. - Pump successfully interrogated and PTM device successfully connected.  Opioid-induced constipation Opioid-induced constipation managed with Miralax . No significant constipation issues reported recently, with one episode resolved with intervention. He is taking Miralax  as needed, with instructions to increase mobility as pain allows to further alleviate constipation. - Continue Miralax  as needed for constipation. -Senna S 2 tablets 1-2 times daily - Encourage increased  mobility as pain allows to help alleviate constipation.  Nocturnal cough with sputum production Nocturnal cough with clear sputum production for about a week. No wheezing or respiratory distress. No recent use of cough medicine. - Purchase and use over-the-counter cough medicine - Consider using a humidifier - Drink warm tea with honey - Monitor for changes in sputum color or fever - Repeat COVID test if symptoms worsen  Nocturia Nocturia with frequent urination at night. No symptoms of urinary tract infection such as burning or difficulty urinating. - Monitor fluid intake, especially in the evening - Encourage hydration during the day  Poor appetite Poor appetite with occasional good intake during social events. -Encourage increase in nutrition. Small frequent meals.   I will plan to see patient back in 1-2 weeks. Sooner if needed.  Patient expressed understanding and was in agreement with this plan. He also understands that He can call the clinic at any time with any questions, concerns, or complaints.   Any controlled substances utilized were prescribed in the context of palliative care. PDMP has been reviewed.   Visit consisted of counseling and education dealing with the complex and emotionally intense issues of symptom management and palliative care in the setting of serious and potentially life-threatening illness.  Levon Borer, AGPCNP-BC  Palliative Medicine Team/[facility]

## 2024-09-26 NOTE — Progress Notes (Incomplete)
 Has armband been applied?  {yes no:314532}  Does patient have an allergy to IV contrast dye?: {yes no:314532}   Has patient ever received premedication for IV contrast dye?: {yes no:314532}   Does patient take metformin ?: {yes no:314532}  If patient does take metformin  when was the last dose: {Time; dates multiple:15870}  Date of lab work: 09/21/2024 BUN: 18 CR: 0.67 eGfr: >60  IV site: {iv locations:314275}  Has IV site been added to flowsheet?  {yes no:314532}  There were no vitals taken for this visit.

## 2024-09-26 NOTE — Telephone Encounter (Signed)
 Scheduled patient for next palliative appointment. Called and spoke with the patients wife, she is aware.

## 2024-09-27 DIAGNOSIS — J449 Chronic obstructive pulmonary disease, unspecified: Secondary | ICD-10-CM | POA: Diagnosis not present

## 2024-09-27 DIAGNOSIS — J9611 Chronic respiratory failure with hypoxia: Secondary | ICD-10-CM | POA: Diagnosis not present

## 2024-09-27 DIAGNOSIS — I2510 Atherosclerotic heart disease of native coronary artery without angina pectoris: Secondary | ICD-10-CM | POA: Diagnosis not present

## 2024-09-27 DIAGNOSIS — E119 Type 2 diabetes mellitus without complications: Secondary | ICD-10-CM | POA: Diagnosis not present

## 2024-09-27 DIAGNOSIS — I10 Essential (primary) hypertension: Secondary | ICD-10-CM | POA: Diagnosis not present

## 2024-09-27 DIAGNOSIS — C3411 Malignant neoplasm of upper lobe, right bronchus or lung: Secondary | ICD-10-CM | POA: Diagnosis not present

## 2024-09-27 NOTE — Telephone Encounter (Signed)
 Received fax from [facility] department requesting OV notes for CGM.  See below for their requirements:    Per last OV note, no mention of insulin  administration or testing blood sugar. Please advise and addend notes if appropriate.

## 2024-09-28 ENCOUNTER — Encounter: Payer: Self-pay | Admitting: Internal Medicine

## 2024-09-28 ENCOUNTER — Ambulatory Visit: Admitting: Radiation Oncology

## 2024-09-28 ENCOUNTER — Other Ambulatory Visit: Payer: Self-pay | Admitting: Internal Medicine

## 2024-09-28 ENCOUNTER — Ambulatory Visit

## 2024-09-28 ENCOUNTER — Ambulatory Visit
Admit: 2024-09-28 | Discharge: 2024-09-28 | Disposition: A | Attending: Radiation Oncology | Admitting: Radiation Oncology

## 2024-09-28 DIAGNOSIS — C7931 Secondary malignant neoplasm of brain: Secondary | ICD-10-CM

## 2024-09-28 MED ORDER — HYDROCODONE BIT-HOMATROP MBR 5-1.5 MG/5ML PO SOLN: 5.0000 mL | Freq: Four times a day (QID) | ORAL | 0 refills | Status: DC | PRN

## 2024-09-29 ENCOUNTER — Ambulatory Visit (HOSPITAL_COMMUNITY)
Admission: RE | Admit: 2024-09-29 | Discharge: 2024-09-29 | Disposition: A | Discharge status: Home / Self Care | Source: Ambulatory Visit | Attending: Radiation Oncology | Admitting: Radiation Oncology

## 2024-09-29 ENCOUNTER — Ambulatory Visit

## 2024-09-29 ENCOUNTER — Ambulatory Visit: Admitting: Radiation Oncology

## 2024-09-29 DIAGNOSIS — C7931 Secondary malignant neoplasm of brain: Secondary | ICD-10-CM | POA: Insufficient documentation

## 2024-09-29 DIAGNOSIS — G9389 Other specified disorders of brain: Secondary | ICD-10-CM | POA: Diagnosis not present

## 2024-09-29 MED ORDER — HEPARIN SOD (PORK) LOCK FLUSH 100 UNIT/ML IV SOLN
500.0000 [IU] | INTRAVENOUS | Status: AC | PRN
  Administered 2024-09-29: 500 [IU]

## 2024-09-29 MED ORDER — GADOBUTROL 1 MMOL/ML IV SOLN
10.0000 mL | Freq: Once | INTRAVENOUS | Status: AC | PRN
  Administered 2024-09-29: 10 mL via INTRAVENOUS

## 2024-09-29 NOTE — Telephone Encounter (Signed)
 OV notes faxed to [facility] medicare CGM department.

## 2024-09-30 ENCOUNTER — Ambulatory Visit
Admission: RE | Admit: 2024-09-30 | Discharge: 2024-09-30 | Disposition: A | Discharge status: Home / Self Care | Source: Ambulatory Visit | Attending: Radiation Oncology | Admitting: Radiation Oncology

## 2024-09-30 VITALS — BP 115/87 | HR 125 | Temp 97.8°F | Resp 20 | Ht 69.0 in | Wt 220.4 lb

## 2024-09-30 DIAGNOSIS — Z87891 Personal history of nicotine dependence: Secondary | ICD-10-CM | POA: Diagnosis not present

## 2024-09-30 DIAGNOSIS — C3481 Malignant neoplasm of overlapping sites of right bronchus and lung: Secondary | ICD-10-CM | POA: Diagnosis not present

## 2024-09-30 DIAGNOSIS — C7931 Secondary malignant neoplasm of brain: Secondary | ICD-10-CM | POA: Insufficient documentation

## 2024-09-30 DIAGNOSIS — C787 Secondary malignant neoplasm of liver and intrahepatic bile duct: Secondary | ICD-10-CM | POA: Diagnosis not present

## 2024-09-30 DIAGNOSIS — Z510 Encounter for antineoplastic radiation therapy: Secondary | ICD-10-CM | POA: Insufficient documentation

## 2024-09-30 DIAGNOSIS — C3411 Malignant neoplasm of upper lobe, right bronchus or lung: Secondary | ICD-10-CM | POA: Diagnosis not present

## 2024-09-30 DIAGNOSIS — Z95828 Presence of other vascular implants and grafts: Secondary | ICD-10-CM

## 2024-09-30 MED ORDER — SODIUM CHLORIDE 0.9% FLUSH: 10.0000 mL | Freq: Once | INTRAVENOUS | Status: DC

## 2024-09-30 MED ORDER — SODIUM CHLORIDE FLUSH 0.9 % IV SOLN: 10.0000 mL | Freq: Once | INTRAVENOUS | Status: DC

## 2024-09-30 NOTE — Progress Notes (Signed)
 Has armband been applied?  Yes.    Does patient have an allergy to IV contrast dye?: No.   Has patient ever received premedication for IV contrast dye?: No.   Does patient take metformin ?: Yes.    If patient does take metformin  when was the last dose: September 30, 2024  Date of lab work: September 26, 2024 BUN: 18 CR: 0.71 eGfr: >60  IV site: Portacath  Has IV site been added to flowsheet?  Yes.    BP 115/87 (BP Location: Left Arm, Patient Position: Sitting, Cuff Size: Large)   Pulse (!) 125   Temp 97.8 F (36.6 C)   Resp 20   Ht 5' 9 (1.753 m)   Wt 220 lb 6.4 oz (100 kg)   SpO2 98%   BMI 32.55 kg/m

## 2024-10-03 ENCOUNTER — Inpatient Hospital Stay: Admitting: Nurse Practitioner

## 2024-10-03 ENCOUNTER — Inpatient Hospital Stay

## 2024-10-03 DIAGNOSIS — J9611 Chronic respiratory failure with hypoxia: Secondary | ICD-10-CM | POA: Diagnosis not present

## 2024-10-03 DIAGNOSIS — G893 Neoplasm related pain (acute) (chronic): Secondary | ICD-10-CM | POA: Diagnosis not present

## 2024-10-03 DIAGNOSIS — Z515 Encounter for palliative care: Secondary | ICD-10-CM | POA: Diagnosis not present

## 2024-10-03 DIAGNOSIS — I10 Essential (primary) hypertension: Secondary | ICD-10-CM | POA: Diagnosis not present

## 2024-10-03 DIAGNOSIS — C3411 Malignant neoplasm of upper lobe, right bronchus or lung: Secondary | ICD-10-CM | POA: Diagnosis not present

## 2024-10-03 DIAGNOSIS — Z5111 Encounter for antineoplastic chemotherapy: Secondary | ICD-10-CM | POA: Diagnosis not present

## 2024-10-03 DIAGNOSIS — J449 Chronic obstructive pulmonary disease, unspecified: Secondary | ICD-10-CM | POA: Diagnosis not present

## 2024-10-03 DIAGNOSIS — E119 Type 2 diabetes mellitus without complications: Secondary | ICD-10-CM | POA: Diagnosis not present

## 2024-10-03 DIAGNOSIS — I2510 Atherosclerotic heart disease of native coronary artery without angina pectoris: Secondary | ICD-10-CM | POA: Diagnosis not present

## 2024-10-03 DIAGNOSIS — C7951 Secondary malignant neoplasm of bone: Secondary | ICD-10-CM | POA: Diagnosis not present

## 2024-10-03 DIAGNOSIS — C787 Secondary malignant neoplasm of liver and intrahepatic bile duct: Secondary | ICD-10-CM | POA: Diagnosis not present

## 2024-10-03 LAB — CMP (CANCER CENTER ONLY)
ALT: 24 U/L (ref 0–44)
AST: 27 U/L (ref 15–41)
Albumin: 3.2 g/dL — ABNORMAL LOW (ref 3.5–5.0)
Alkaline Phosphatase: 157 U/L — ABNORMAL HIGH (ref 38–126)
Anion gap: 11 (ref 5–15)
BUN: 18 mg/dL (ref 8–23)
CO2: 28 mmol/L (ref 22–32)
Calcium: 8.6 mg/dL — ABNORMAL LOW (ref 8.9–10.3)
Chloride: 96 mmol/L — ABNORMAL LOW (ref 98–111)
Creatinine: 0.71 mg/dL (ref 0.61–1.24)
GFR, Estimated: >60 mL/min (ref >60)
Glucose, Bld: 195 mg/dL — ABNORMAL HIGH (ref 70–99)
Potassium: 3.5 mmol/L (ref 3.5–5.1)
Sodium: 135 mmol/L (ref 135–145)
Total Bilirubin: 0.6 mg/dL (ref 0.0–1.2)
Total Protein: 6.1 g/dL — ABNORMAL LOW (ref 6.5–8.1)

## 2024-10-03 LAB — CBC WITH DIFFERENTIAL (CANCER CENTER ONLY)
Abs Immature Granulocytes: 0.01 K/uL (ref 0.00–0.07)
Basophils Absolute: 0 K/uL (ref 0.0–0.1)
Basophils Relative: 0 %
Eosinophils Absolute: 0 K/uL (ref 0.0–0.5)
Eosinophils Relative: 0 %
HCT: 24.7 % — ABNORMAL LOW (ref 39.0–52.0)
Hemoglobin: 8.3 g/dL — ABNORMAL LOW (ref 13.0–17.0)
Immature Granulocytes: 0 %
Lymphocytes Relative: 7 %
Lymphs Abs: 0.3 K/uL — ABNORMAL LOW (ref 0.7–4.0)
MCH: 31.1 pg (ref 26.0–34.0)
MCHC: 33.6 g/dL (ref 30.0–36.0)
MCV: 92.5 fL (ref 80.0–100.0)
Monocytes Absolute: 0.5 K/uL (ref 0.1–1.0)
Monocytes Relative: 14 %
Neutro Abs: 2.6 K/uL (ref 1.7–7.7)
Neutrophils Relative %: 79 %
Platelet Count: 54 K/uL — ABNORMAL LOW (ref 150–400)
RBC: 2.67 MIL/uL — ABNORMAL LOW (ref 4.22–5.81)
RDW: 17.3 % — ABNORMAL HIGH (ref 11.5–15.5)
WBC Count: 3.4 K/uL — ABNORMAL LOW (ref 4.0–10.5)
nRBC: 0 % (ref 0.0–0.2)

## 2024-10-03 NOTE — Telephone Encounter (Signed)
 Adam Wise, see message from Peg regarding the Medicare paperwork. Can you help?

## 2024-10-04 ENCOUNTER — Encounter: Payer: Self-pay | Admitting: Neurosurgery

## 2024-10-04 ENCOUNTER — Other Ambulatory Visit

## 2024-10-04 ENCOUNTER — Ambulatory Visit (INDEPENDENT_AMBULATORY_CARE_PROVIDER_SITE_OTHER): Admitting: Neurosurgery

## 2024-10-04 ENCOUNTER — Inpatient Hospital Stay
Admission: RE | Admit: 2024-10-04 | Discharge: 2024-10-04 | Disposition: A | Discharge status: Home / Self Care | Payer: Self-pay | Source: Ambulatory Visit | Attending: Neurosurgery | Admitting: Neurosurgery

## 2024-10-04 ENCOUNTER — Other Ambulatory Visit: Payer: Self-pay

## 2024-10-04 VITALS — BP 115/77 | HR 136 | Ht 69.0 in | Wt 217.0 lb

## 2024-10-04 DIAGNOSIS — C3411 Malignant neoplasm of upper lobe, right bronchus or lung: Secondary | ICD-10-CM | POA: Diagnosis not present

## 2024-10-04 DIAGNOSIS — Z049 Encounter for examination and observation for unspecified reason: Secondary | ICD-10-CM

## 2024-10-04 DIAGNOSIS — Z87891 Personal history of nicotine dependence: Secondary | ICD-10-CM | POA: Diagnosis not present

## 2024-10-04 DIAGNOSIS — Z09 Encounter for follow-up examination after completed treatment for conditions other than malignant neoplasm: Secondary | ICD-10-CM

## 2024-10-04 DIAGNOSIS — Z510 Encounter for antineoplastic radiation therapy: Secondary | ICD-10-CM | POA: Diagnosis not present

## 2024-10-04 DIAGNOSIS — C787 Secondary malignant neoplasm of liver and intrahepatic bile duct: Secondary | ICD-10-CM | POA: Diagnosis not present

## 2024-10-04 DIAGNOSIS — C7931 Secondary malignant neoplasm of brain: Secondary | ICD-10-CM | POA: Diagnosis not present

## 2024-10-04 DIAGNOSIS — C3481 Malignant neoplasm of overlapping sites of right bronchus and lung: Secondary | ICD-10-CM | POA: Diagnosis not present

## 2024-10-04 NOTE — Progress Notes (Unsigned)
 Assessment : Discussed the use of AI scribe software for clinical note transcription with the patient, who gave verbal consent to proceed.  History of Present Illness Adam Wise is a 73 year old male with stage four lung cancer who presents for evaluation prior to brain radiation therapy.  He was diagnosed with stage four lung cancer in April 2021 after presenting with a persistent cough. He has a history of smoking but quit many years ago. Initial treatment included radiation and multiple infusions under the care of his oncologist. He also participated in a clinical trial at [facility] for a new drug.  He is scheduled for brain radiation therapy tomorrow. He has undergone two previous brain MRIs, which were negative, but a recent MRI showed a small lesion. No symptoms such as headaches, seizures, numbness, or weakness. His cancer type is known to metastasize to the brain, which prompted the imaging studies.  He has chronic back pain, which has been a significant issue. He has a history of scoliosis and has been on morphine  and oxycodone  since January. Recently, a morphine  pain pump was implanted on October 6th, which has provided significant relief. Prior to this, he received two shots from an anesthesiologist, which did not alleviate his neck pain.  His cancer has metastasized to his liver. He feels tired, which he attributes more to the medical treatments than the cancer itself. He was the first patient at [facility] to receive Cosela , a drug that protects bone marrow during chemotherapy. He has tolerated his treatments well, without experiencing nausea.    Plan : I reviewed the findings on the MRI with him and I talked to him about a collaborative team with radiation oncologist.  I shared with him that if he was my brother, I indeed would go with the plan of radiation.  I think that surgical resection is going to carry with a significantly higher morbidity and no  significant advantage over stereotactic radiosurgery.  I shared this with him and his wife and we decided that he has been a follow-up with the radiation oncology team and I will see him tomorrow at the cancer center.   Social History   Socioeconomic History   Marital status: Married    Spouse name: Not on file   Number of children: Not on file   Years of education: Not on file   Highest education level: 12th grade  Occupational History   Not on file  Tobacco Use   Smoking status: Former    Passive exposure: Never   Smokeless tobacco: Never  Vaping Use   Vaping status: Never Used  Substance and Sexual Activity   Alcohol use: Not Currently    Comment: rarely   Drug use: Not Currently   Sexual activity: Yes  Other Topics Concern   Not on file  Social History Narrative   Married.   Moved from [place].   Retired.   Social Drivers of Corporate investment banker Strain: Low Risk  (08/23/2024)   Received from [facility]   Overall Financial Resource Strain (CARDIA)    How hard is it for you to pay for the very basics like food, housing, medical care, and heating?: Not hard at all  Food Insecurity: No Food Insecurity (08/23/2024)   Received from [facility]   Hunger Vital Sign    Within the past 12 months, you worried that your food would run out before you got the money to buy more.: Never true  Within the past 12 months, the food you bought just didn't last and you didn't have money to get more.: Never true  Transportation Needs: No Transportation Needs (08/23/2024)   Received from [facility] - Transportation    In the past 12 months, has lack of transportation kept you from medical appointments or from getting medications?: No    In the past 12 months, has lack of transportation kept you from meetings, work, or from getting things needed for daily living?: No  Physical Activity: Inactive (08/23/2024)   Received from [facility]   Exercise Vital Sign    On  average, how many days per week do you engage in moderate to strenuous exercise (like a brisk walk)?: 0 days    Minutes of Exercise per Session: Not on file  Stress: No Stress Concern Present (08/23/2024)   Received from [facility] - Occupational Stress Questionnaire    Do you feel stress - tense, restless, nervous, or anxious, or unable to sleep at night because your mind is troubled all the time - these days?: Not at all  Social Connections: Socially Integrated (08/23/2024)   Received from [facility]   Social Network    How would you rate your social network (family, work, friends)?: Good participation with social networks  Intimate Partner Violence: Not At Risk (08/23/2024)   Received from [facility]   HITS    Over the last 12 months how often did your partner physically hurt you?: Never    Over the last 12 months how often did your partner insult you or talk down to you?: Never    Over the last 12 months how often did your partner threaten you with physical harm?: Never    Over the last 12 months how often did your partner scream or curse at you?: Never    Family History  Problem Relation Age of Onset   Heart attack Mother    Cancer Mother    Hypertension Mother    Arthritis Father    Asthma Father    Cancer Father    COPD Father    Heart attack Father    Hypertension Sister    Cancer Sister    Diabetes Sister    Asthma Sister    Cancer Sister    COPD Sister    Arthritis Sister    Asthma Sister    Diabetes Sister    Birth defects Maternal Grandfather    Arthritis Paternal Grandmother    Diabetes Paternal Grandmother    Arthritis Paternal Grandfather    Asthma Son    Other Neg Hx        pituitary disorder    Allergies  Allergen Reactions   Bupropion Other (See Comments) and Dermatitis    Racing heart  palpitations  Racing heart  palpitations Racing heart palpitations Racing heart    palpitations    Racing heart     palpitations Racing heart palpitations Racing heart    palpitations, Racing heart, palpitations, Racing heart   Hydrochlorothiazide Other (See Comments)    Cramping to lower extremities   Lisinopril      Cough     Past Medical History:  Diagnosis Date   Acute on chronic respiratory failure with hypoxia (HCC) 10/12/2021   Anxiety    Arthritis    Chickenpox    Chronic knee pain    Chronic low back pain    COPD exacerbation (HCC) 10/11/2021  COPD with exacerbation (HCC) 10/12/2021   Coronary artery calcification seen on CAT scan 11/2021   Coronary CTA 11/27/2021: Moderate to large right pleural effusion and compressive atelectasis right lung base. => Coronary Calcium Score 657.  Diffuse RCA calcification.  Minimal mild disease in the LAD and diagonal branches. == Overall limited study.  Notable artifact.   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 He has disease progression after the first line of chemotherapy in December 2021.   Testosterone  deficiency    Type 2 diabetes mellitus (HCC)     Past Surgical History:  Procedure Laterality Date   CHEST TUBE INSERTION Right 01/20/2022   Procedure: CHEST TUBE INSERTION;  Surgeon: Claudene Toribio BROCKS, MD;  Location: [facility] ENDOSCOPY;  Service: Pulmonary;  Laterality: Right;  w/ Talc  Pleurodesis, planned admit for Obs afterwards   CHEST TUBE INSERTION Right 04/28/2022   Procedure: INSERTION PLEURAL DRAINAGE CATHETER - Pigtail tail, drainage;  Surgeon: Claudene Toribio BROCKS, MD;  Location: [facility] ENDOSCOPY;  Service: Pulmonary;  Laterality: Right;  talc  pleurodesis   CHEST TUBE INSERTION Right 09/01/2022   Procedure: INSERTION PLEURAL DRAINAGE CATHETER;  Surgeon: Alaine Vicenta NOVAK, MD;  Location: [facility] ENDOSCOPY;  Service: Cardiopulmonary;  Laterality: Right;  pigtail catheter placement with talc  pleurodesis   COLONOSCOPY WITH PROPOFOL  N/A 12/17/2018   Procedure: COLONOSCOPY WITH PROPOFOL ;  Surgeon: Janalyn Keene NOVAK, MD;  Location: [facility] ENDOSCOPY;  Service: Gastroenterology;  Laterality: N/A;   IR IMAGING GUIDED PORT INSERTION  04/17/2020   IR IVC FILTER RETRIEVAL / S&I /IMG GUID/MOD SED  09/19/2024   IR IVC FILTER RETRIEVAL / S&I PORTER GUID/MOD SED  09/19/2024   IR THORACENTESIS ASP PLEURAL SPACE W/IMG GUIDE  11/29/2021   IR THORACENTESIS ASP PLEURAL SPACE W/IMG GUIDE  01/03/2022   JOINT REPLACEMENT Bilateral    RADIOLOGY WITH ANESTHESIA N/A 09/19/2024   Procedure: RADIOLOGY WITH ANESTHESIA;  Surgeon: Radiologist, Medication, MD;  Location: [facility] ORS;  Service: Radiology;  Laterality: N/A;   REPLACEMENT TOTAL KNEE BILATERAL  2015   TALC  PLEURODESIS  09/01/2022   Procedure: TALC  PLEURADESIS;  Surgeon: Alaine Vicenta NOVAK, MD;  Location: [facility] ENDOSCOPY;  Service: Cardiopulmonary;;   TONSILLECTOMY  1960   TRANSTHORACIC ECHOCARDIOGRAM  05/2020   a) 05/2020: EF 55 to 60%.  No R WMA.  Mild LVH.  Indeterminate LVEDP.  Unable to assess RVP.  Normal aortic and mitral valves.  Mildly elevated RAP.; b) 09/2021: EF 50-55%. No RWMA. Mild LVH. ~ LVEDP. Mild LA Dilation. NORMAL RV/RAP.  Normal MV/AoV.     Physical Exam HENT:     Head: Normocephalic.     Nose: Nose normal.  Eyes:     Pupils: Pupils are equal, round, and reactive to light.  Cardiovascular:     Rate and Rhythm: Normal rate.  Pulmonary:     Effort: Pulmonary effort is normal.     Comments: Rhonchi Abdominal:     Comments: Obese  Musculoskeletal:     Cervical back: Normal range of motion.  Neurological:  General: No focal deficit present.     Mental Status: He is alert.     Cranial Nerves: Cranial nerves 2-12 are intact.     Sensory: Sensory deficit present.     Motor: Motor function is intact.     Coordination: Coordination is intact.     Comments: Distal numbness LE Ambulates with walker        Results for orders placed or performed during the hospital encounter of 09/28/24  MR Brain W Wo Contrast   Narrative   EXAM: MRI BRAIN WITH AND WITHOUT CONTRAST 09/29/2024 06:22:48 PM  TECHNIQUE: Multiplanar multisequence MRI of the head/brain was performed with and without the administration of 10mL gadobutrol  (GADAVIST ) 1 MMOL/ML injection 10 mL GADOBUTROL  1 MMOL/ML IV SOLN.  COMPARISON: Comparison made with prior study from 09/06/2024.  CLINICAL HISTORY: Metastatic disease evaluation; 3T SRS Protocol for radiation treatment planning.  FINDINGS:  BRAIN AND VENTRICLES: No acute infarct. No acute intracranial hemorrhage. Single chronic microhemorrhage noted within the peripheral left cerebellar hemisphere, stable. No mass effect or midline shift. No hydrocephalus. The sella is unremarkable. Normal flow voids. Enhancing mass involving the peripheral aspect of the inferior right cerebellum again seen lesion measures 1.1 x 0.6 x 1.0 cm (series 1100, image 128). Additional enhancing lesion involving the peripheral parafalcine left parietal cortex measures 1.0 x 0.8 x 1.1 cm (series 11, image 245). Associated diffusion and FLAIR signal abnormality again noted. No significant surrounding edema or regional mass effect. Overall, changes are concerning for metastatic disease, and are not significantly changed as compared to prior brain MRI from 09/06/2024. No new lesions identified. Note made of a small linear focus of enhancement involving the mid right cerebellar hemisphere (series 1100, image 150) felt to be consistent with a vascular structure. This is better seen on prior  MRI.  ORBITS: No acute abnormality.  SINUSES: No acute abnormality.  BONES AND SOFT TISSUES: Normal bone marrow signal and enhancement. No acute soft tissue abnormality.  IMPRESSION: 1. Enhancing lesions involving the inferior right cerebellum and left parietal cortex as detailed above, concerning for metastatic disease. No significant surrounding edema or regional mass effect. 2. Overall, appearance is stable as compared to prior MRI from 09/06/24. No new lesions.  Electronically signed by: Morene Hoard MD 09/29/2024 09:42 PM EDT RP Workstation: [id number]   Results for orders placed or performed during the hospital encounter of 01/04/24  CT Head Wo Contrast   Narrative   CLINICAL DATA:  mental status change, known small cell carcinoma  EXAM: CT HEAD WITHOUT CONTRAST  TECHNIQUE: Contiguous axial images were obtained from the base of the skull through the vertex without intravenous contrast.  RADIATION DOSE REDUCTION: This exam was performed according to the departmental dose-optimization program which includes automated exposure control, adjustment of the mA and/or kV according to patient size and/or use of iterative reconstruction technique.  COMPARISON:  MR Head 02/06/23  FINDINGS: Brain: No hemorrhage. No hydrocephalus. No extra-axial fluid collection. No CT evidence of an acute cortical infarct. No mass effect. No mass lesion.  Vascular: No hyperdense vessel or unexpected calcification.  Skull: Normal. Negative for fracture or focal lesion.  Sinuses/Orbits: No middle ear or mastoid effusion. Air-fluid level in the right maxillary sinus, which can be seen in the setting of acute sinusitis. Orbits are unremarkable.  Other: None.  IMPRESSION: 1. No acute intracranial abnormality. 2. Air-fluid level in the right maxillary sinus, which can be seen in the setting of acute sinusitis.   Electronically Signed  By: Hemant  Desai M.D.   On: 01/04/2024  11:18    *Note: Due to a large number of results and/or encounters for the requested time period, some results have not been displayed. A complete set of results can be found in Results Review.

## 2024-10-05 ENCOUNTER — Encounter: Payer: Self-pay | Admitting: Nurse Practitioner

## 2024-10-05 ENCOUNTER — Other Ambulatory Visit: Payer: Self-pay

## 2024-10-05 ENCOUNTER — Other Ambulatory Visit (HOSPITAL_COMMUNITY)

## 2024-10-05 ENCOUNTER — Ambulatory Visit
Admission: RE | Admit: 2024-10-05 | Discharge: 2024-10-05 | Disposition: A | Source: Ambulatory Visit | Attending: Radiation Oncology | Admitting: Radiation Oncology

## 2024-10-05 ENCOUNTER — Inpatient Hospital Stay

## 2024-10-05 DIAGNOSIS — C3481 Malignant neoplasm of overlapping sites of right bronchus and lung: Secondary | ICD-10-CM | POA: Diagnosis not present

## 2024-10-05 DIAGNOSIS — Z510 Encounter for antineoplastic radiation therapy: Secondary | ICD-10-CM | POA: Diagnosis not present

## 2024-10-05 DIAGNOSIS — C7931 Secondary malignant neoplasm of brain: Secondary | ICD-10-CM | POA: Diagnosis not present

## 2024-10-05 DIAGNOSIS — C3411 Malignant neoplasm of upper lobe, right bronchus or lung: Secondary | ICD-10-CM | POA: Diagnosis not present

## 2024-10-05 DIAGNOSIS — Z87891 Personal history of nicotine dependence: Secondary | ICD-10-CM | POA: Diagnosis not present

## 2024-10-05 DIAGNOSIS — C787 Secondary malignant neoplasm of liver and intrahepatic bile duct: Secondary | ICD-10-CM | POA: Diagnosis not present

## 2024-10-05 LAB — RAD ONC ARIA SESSION SUMMARY
Course Elapsed Days: 0
Plan Fractions Treated to Date: 1
Plan Prescribed Dose Per Fraction: 20 Gy
Plan Total Fractions Prescribed: 1
Plan Total Prescribed Dose: 20 Gy
Reference Point Dosage Given to Date: 20 Gy
Reference Point Session Dosage Given: 20 Gy
Session Number: 1

## 2024-10-05 NOTE — Progress Notes (Signed)
 Mr. Sabado rested with us  for 30 minutes following his SRS treatment.  Patient denies headache, dizziness, nausea, diplopia or ringing in the ears. Denies fatigue. Patient without complaints. Understands to avoid strenuous activity for the next 24 hours and call [phone] with needs.   BP 116/83 (BP Location: Left Arm)   Pulse (!) 124   Temp (!) 96.6 F (35.9 C) (Temporal)   Resp 20   SpO2 97%

## 2024-10-07 ENCOUNTER — Ambulatory Visit (HOSPITAL_COMMUNITY)
Admission: RE | Admit: 2024-10-07 | Discharge: 2024-10-07 | Disposition: A | Discharge status: Home / Self Care | Source: Ambulatory Visit | Attending: Internal Medicine | Admitting: Internal Medicine

## 2024-10-07 DIAGNOSIS — C3490 Malignant neoplasm of unspecified part of unspecified bronchus or lung: Secondary | ICD-10-CM | POA: Diagnosis not present

## 2024-10-07 DIAGNOSIS — C787 Secondary malignant neoplasm of liver and intrahepatic bile duct: Secondary | ICD-10-CM | POA: Diagnosis not present

## 2024-10-07 DIAGNOSIS — I2510 Atherosclerotic heart disease of native coronary artery without angina pectoris: Secondary | ICD-10-CM | POA: Diagnosis not present

## 2024-10-07 DIAGNOSIS — J90 Pleural effusion, not elsewhere classified: Secondary | ICD-10-CM | POA: Diagnosis not present

## 2024-10-07 DIAGNOSIS — C7931 Secondary malignant neoplasm of brain: Secondary | ICD-10-CM | POA: Insufficient documentation

## 2024-10-07 MED ORDER — HEPARIN SOD (PORK) LOCK FLUSH 100 UNIT/ML IV SOLN
500.0000 [IU] | Freq: Once | INTRAVENOUS | Status: AC
  Administered 2024-10-07: 500 [IU] via INTRAVENOUS

## 2024-10-07 MED ORDER — IOHEXOL 300 MG/ML  SOLN
100.0000 mL | Freq: Once | INTRAMUSCULAR | Status: AC | PRN
  Administered 2024-10-07: 100 mL via INTRAVENOUS

## 2024-10-08 NOTE — Radiation Completion Notes (Signed)
  Radiation Oncology         (336) [phone] ________________________________  Name: Darcey Demma MRN: [id number]  Date of Service: 10/05/2024  DOB: 1951/07/21  End of Treatment Note  Diagnosis: Progressive metastatic extensive stage small cell carcinoma arising in the right chest overlapping the right upper lobe and right middle lobe and hilum with liver, bone, and brain disease   Intent: Palliative     ==========DELIVERED PLANS==========  First Treatment Date: 2024-10-05 Last Treatment Date: 2024-10-05   Plan Name: Brain_SRS_New Site: Brain PTV_1_ParietalL_4mm PTV_2_CerebellumR_86mm Technique: SBRT/SRT-IMRT Mode: Photon Dose Per Fraction: 20 Gy Prescribed Dose (Delivered / Prescribed): 20 Gy / 20 Gy Prescribed Fxs (Delivered / Prescribed): 1 / 1     ==========ON TREATMENT VISIT DATES========== 2024-10-05   See weekly On Treatment Notes in Epic for details in the Media tab (listed as Progress notes on the On Treatment Visit Dates listed above). The patient tolerated radiation.   The patient will receive a call in about one month from the radiation oncology department. He will continue follow up with Dr. Sherrod  as well as in the brain oncology program.      Donald KYM Husband, [facility]

## 2024-10-11 ENCOUNTER — Other Ambulatory Visit: Payer: Self-pay | Admitting: Physician Assistant

## 2024-10-11 ENCOUNTER — Other Ambulatory Visit: Payer: Self-pay | Admitting: Primary Care

## 2024-10-11 DIAGNOSIS — E119 Type 2 diabetes mellitus without complications: Secondary | ICD-10-CM

## 2024-10-11 DIAGNOSIS — I2692 Saddle embolus of pulmonary artery without acute cor pulmonale: Secondary | ICD-10-CM

## 2024-10-12 ENCOUNTER — Inpatient Hospital Stay: Admitting: Internal Medicine

## 2024-10-12 ENCOUNTER — Inpatient Hospital Stay: Admitting: Dietician

## 2024-10-12 ENCOUNTER — Inpatient Hospital Stay

## 2024-10-12 ENCOUNTER — Encounter: Payer: Self-pay | Admitting: Nurse Practitioner

## 2024-10-12 ENCOUNTER — Encounter: Payer: Self-pay | Admitting: Internal Medicine

## 2024-10-12 ENCOUNTER — Inpatient Hospital Stay: Admitting: Nurse Practitioner

## 2024-10-12 VITALS — BP 109/73 | HR 139 | Temp 97.6°F | Resp 17 | Ht 69.0 in | Wt 217.0 lb

## 2024-10-12 DIAGNOSIS — R530 Neoplastic (malignant) related fatigue: Secondary | ICD-10-CM

## 2024-10-12 DIAGNOSIS — K5903 Drug induced constipation: Secondary | ICD-10-CM

## 2024-10-12 DIAGNOSIS — R630 Anorexia: Secondary | ICD-10-CM | POA: Diagnosis not present

## 2024-10-12 DIAGNOSIS — C3411 Malignant neoplasm of upper lobe, right bronchus or lung: Secondary | ICD-10-CM

## 2024-10-12 DIAGNOSIS — Z515 Encounter for palliative care: Secondary | ICD-10-CM

## 2024-10-12 DIAGNOSIS — C7951 Secondary malignant neoplasm of bone: Secondary | ICD-10-CM | POA: Diagnosis not present

## 2024-10-12 DIAGNOSIS — R634 Abnormal weight loss: Secondary | ICD-10-CM | POA: Diagnosis not present

## 2024-10-12 DIAGNOSIS — G893 Neoplasm related pain (acute) (chronic): Secondary | ICD-10-CM | POA: Diagnosis not present

## 2024-10-12 DIAGNOSIS — C787 Secondary malignant neoplasm of liver and intrahepatic bile duct: Secondary | ICD-10-CM | POA: Diagnosis not present

## 2024-10-12 DIAGNOSIS — Z5111 Encounter for antineoplastic chemotherapy: Secondary | ICD-10-CM | POA: Diagnosis not present

## 2024-10-12 LAB — CMP (CANCER CENTER ONLY)
ALT: 21 U/L (ref 0–44)
AST: 30 U/L (ref 15–41)
Albumin: 3.2 g/dL — ABNORMAL LOW (ref 3.5–5.0)
Alkaline Phosphatase: 166 U/L — ABNORMAL HIGH (ref 38–126)
Anion gap: 11 (ref 5–15)
BUN: 12 mg/dL (ref 8–23)
CO2: 28 mmol/L (ref 22–32)
Calcium: 8.1 mg/dL — ABNORMAL LOW (ref 8.9–10.3)
Chloride: 96 mmol/L — ABNORMAL LOW (ref 98–111)
Creatinine: 0.78 mg/dL (ref 0.61–1.24)
GFR, Estimated: >60 mL/min (ref >60)
Glucose, Bld: 198 mg/dL — ABNORMAL HIGH (ref 70–99)
Potassium: 3.7 mmol/L (ref 3.5–5.1)
Sodium: 135 mmol/L (ref 135–145)
Total Bilirubin: 0.6 mg/dL (ref 0.0–1.2)
Total Protein: 6.1 g/dL — ABNORMAL LOW (ref 6.5–8.1)

## 2024-10-12 LAB — CBC WITH DIFFERENTIAL (CANCER CENTER ONLY)
Abs Immature Granulocytes: 0.07 K/uL (ref 0.00–0.07)
Basophils Absolute: 0 K/uL (ref 0.0–0.1)
Basophils Relative: 1 %
Eosinophils Absolute: 0 K/uL (ref 0.0–0.5)
Eosinophils Relative: 0 %
HCT: 27 % — ABNORMAL LOW (ref 39.0–52.0)
Hemoglobin: 8.6 g/dL — ABNORMAL LOW (ref 13.0–17.0)
Immature Granulocytes: 2 %
Lymphocytes Relative: 9 %
Lymphs Abs: 0.4 K/uL — ABNORMAL LOW (ref 0.7–4.0)
MCH: 30.7 pg (ref 26.0–34.0)
MCHC: 31.9 g/dL (ref 30.0–36.0)
MCV: 96.4 fL (ref 80.0–100.0)
Monocytes Absolute: 0.7 K/uL (ref 0.1–1.0)
Monocytes Relative: 16 %
Neutro Abs: 3.1 K/uL (ref 1.7–7.7)
Neutrophils Relative %: 72 %
Platelet Count: 216 K/uL (ref 150–400)
RBC: 2.8 MIL/uL — ABNORMAL LOW (ref 4.22–5.81)
RDW: 19.6 % — ABNORMAL HIGH (ref 11.5–15.5)
WBC Count: 4.3 K/uL (ref 4.0–10.5)
nRBC: 0 % (ref 0.0–0.2)

## 2024-10-12 MED ORDER — MORPHINE SULFATE (PF) 2 MG/ML IV SOLN
2.0000 mg | Freq: Once | INTRAVENOUS | Status: AC
  Administered 2024-10-12: 2 mg via INTRAVENOUS
  Filled 2024-10-12: qty 1

## 2024-10-12 MED ORDER — SODIUM CHLORIDE 0.9 % IV SOLN: INTRAVENOUS | Status: AC

## 2024-10-12 MED ORDER — ONDANSETRON HCL 4 MG/2ML IJ SOLN
8.0000 mg | Freq: Once | INTRAMUSCULAR | Status: AC
  Administered 2024-10-12: 8 mg via INTRAVENOUS
  Filled 2024-10-12: qty 4

## 2024-10-12 NOTE — Progress Notes (Signed)
 Nutrition Assessment   Reason for Assessment: +MST (wt loss, poor appetite)   ASSESSMENT: 73 year old male with relapsed extensive stage small cell lung cancer (diagnosed 2021). Patient is currently receiving palliative carbo/etoposide  under the care of Dr. Sherrod  Past medical history includes HTN, OSA, COPD, recurrent pleural effusion on right, chronic respiratory failure with hypoxia, GERD, DM2, HLD, pituitary cyst, DOE, chronic low back pain, chronic fatigue, h/o PE  Met with patient and wife in infusion. Treatment held. He is receiving IVF. Patient is tired this afternoon, but agreeable to visit. Patient reports poor appetite ongoing this last year. He is having taste changes related to therapy. Eating couple times a day and drinks a premier protein most everyday. Patient does not like taste of Boost or Ensure, but does like milk. Yesterday he ate a little bit of baby cereal, oatmeal (prepared with milk), and buttered toast. He ordered royal lunch milk crackers (120 kcal, 3 g per 3 crackers) which he is looking forward to getting. Says he crushes them in a bowl and pours milk on top.    Nutrition Focused Physical Exam: deferred   Medications: tumeric, calcium plus D, CoQ10, CGM, flexeril , colace, eliquis , nexium, hycodan, novolog , metformin , remeron , ms contin , MVI, zofran , percocet, actos , miralax , pravastatin   Labs: glucose 198, albumin 3.2, Hgb 8.6, alk phos 166   Anthropometrics:   Height: 5'9 Weight: 217 lb  UBW: 263 lb (12/24/23) BMI: 32.05   NUTRITION DIAGNOSIS: Unintended wt loss related to relapsed ext stage SCLC as evidenced by reported persistent anorexia, taste changes, 17.5% decrease from usual weight in the last 9 months. This is severe for time frame    INTERVENTION:  Educated on strategies for poor appetite, encourage eating by the clock - bites/sips q2-3h (handout with tips + snack ideas provided) Suggested soft moist high protein foods for ease of intake -  handout with list of foods provided  Continue premier protein, recommend daily intake for added protein - provided samples of CIB for pt to try, educated to mix with 8 oz of whole milk as alternate ONS  Patient agreeable to spoonful of peanut butter at bedtime Educated on strategies for taste/smell changes, suggested trying baking soda salt water gargles several times daily before oral intake - handout with tips + recipe provided Contact information provided    MONITORING, EVALUATION, GOAL: Pt will tolerate increased calories and protein to minimize further wt loss    Next Visit: To be scheduled with infusion as appropriate

## 2024-10-12 NOTE — Progress Notes (Signed)
 [name] [phone]   [phone]  OFFICE PROGRESS NOTE  Adam Comer POUR, NP [address] Adam Wise Adam Wise [address]  DIAGNOSIS: Relapsed extensive stage (T4, N3, M1c)  small cell lung cancer diagnosed in April 2021 and presented with large right upper lobe/right hilar mass with ipsilateral and contralateral mediastinal and right supraclavicular lymphadenopathy in addition to multiple liver lesions. He has disease progression after the first line of chemotherapy in December 2021.    PRIOR THERAPY:  1) Palliative radiotherapy to the right upper lobe lung mass under the care of Dr. Dewey. 2) Systemic chemotherapy with carboplatin  for AUC of 5 on day 1, etoposide  100 mg/M2 on days 1, 2 and 3 in addition to Imfinzi  1500 mg IV every 3 weeks with chemotherapy then every 4 weeks for maintenance if the patient has no evidence for progression.  He will also receive Cosela  240 mg/m2 on the days of the chemotherapy.  Status post 9 cycles.  Starting from cycle #5 the patient will be on maintenance treatment with immunotherapy with Imfinzi  1500 mg IV every 4 weeks. Last dose of chemotherapy was given on November 13, 2020. This treatment was discontinued secondary to disease progression 3) Systemic chemotherapy with carboplatin  for AUC of 5 on day 1, etoposide  100 mg/M2 on days 1, 2 and 3, Tecentriq  1200 mg IV every 3 weeks as well as Cosela  250 mg/M2 on the days of the chemotherapy every 3 weeks.  First dose December 18, 2020.  Status post 8 cycles. 4) Zepzelca  (lurbinectedin ) 3.2 mgm2 IV every 3 weeks. Last dose on 01/23/22. Status post 12 cycles.  5) Palliative systemic chemotherapy with irinotecan  65 mg/m2 on days 1 and 8 IV every 3 weeks.  Status post 3 cycles.  Last dose was given April 01, 2022 discontinued secondary to disease progression. 6) SBRT to the progressive liver lesions under the care of Dr. Dewey.  Last fraction January 27, 2023. 7) systemic  chemotherapy with carboplatin  for AUC of 5 on day 1, etoposide  100 Mg/M2 on days 1, 2 and 3 with Cosela  before the chemotherapy.  First dose expected to start on 05/28/2022.  Status post 15 cycles.  Starting from cycle #5 his carboplatin  will be reduced to AUC of 4 and 2 etoposide  80 Mg/M2.  Last dose was giving April 07, 2023 discontinued for suspicious disease progression. 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 Dewey. Last dose on 05/16/24.  11) Topotecan  1.2 mg/M2 on days 1-5 every 3 weeks with Cosela  240 Mg/M2 IV before the chemotherapy.  First dose January 11, 2024. Status post 8 cycles.     CURRENT THERAPY: Palliative systemic chemotherapy with carboplatin  on day 1 and etoposide  on days 1, 2, and 3 IV every 3 weeks.  First dose expected around 08/10/2024.  Dr. Sherrod will likely reduce the dose from cycle #1. He is status post 1 cycle.   INTERVAL HISTORY: Adam Wise 72 y.o. male returns to the clinic today for follow-up visit accompanied by his wife. Discussed the use of AI scribe software for clinical note transcription with the patient, who gave verbal consent to proceed.  History of Present Illness Adam Wise is a 73 year old male with extensive stage small cell lung cancer who presents for evaluation with repeat CT scan for re-staging of his disease. He is accompanied by his wife,  Adam Wise.  He has a history of extensive stage small cell lung cancer diagnosed in April 2021. He has undergone several treatments including palliative radiotherapy, systemic chemotherapy with carboplatin  and etoposide , and treatment with tucatinib at [facility]. His treatment with topotecan  was discontinued due to disease progression. He is currently on a reduced dose of carboplatin  and etoposide , having completed three cycles.  He experiences back pain, which he describes as 'coming back again'. He  also has an upset stomach for the past week, with reduced intake, vomiting, constipation, and diarrhea. He has not taken any nausea medication.  During the review of symptoms, he reports difficulty breathing, particularly in the morning when he wakes up feeling 'raspy' and needing to clear mucus. He also feels tired and sleeps a lot.    MEDICAL HISTORY: Past Medical History:  Diagnosis Date   Acute on chronic respiratory failure with hypoxia (HCC) 10/12/2021   Anxiety    Arthritis    Chickenpox    Chronic knee pain    Chronic low back pain    COPD exacerbation (HCC) 10/11/2021   COPD with exacerbation (HCC) 10/12/2021   Coronary artery calcification seen on CAT scan 11/2021   Coronary CTA 11/27/2021: Moderate to large right pleural effusion and compressive atelectasis right lung base. => Coronary Calcium Score 657.  Diffuse RCA calcification.  Minimal mild disease in the LAD and diagonal branches. == Overall limited study.  Notable artifact.   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 He has disease progression after the first line of chemotherapy in December 2021.   Testosterone  deficiency    Type 2 diabetes mellitus (HCC)     ALLERGIES:  is allergic to bupropion, hydrochlorothiazide, and lisinopril .  MEDICATIONS:  Current Outpatient Medications  Medication Sig Dispense Refill   albuterol  (VENTOLIN  HFA) 108 (90 Base) MCG/ACT inhaler Inhale 2 puffs into the lungs every 4 (four) hours as  needed for shortness of breath. 18 each 0   B-D 3CC LUER-LOK SYR 22GX1 22G X 1 3 ML MISC USE AS INSTRUCTED FOR TESTOSTERONE  INJECTION EVERY 2 WEEKS 10 each 2   Black Pepper-Turmeric (TURMERIC COMPLEX/BLACK PEPPER PO) Take 1 tablet by mouth in the morning and at bedtime.     blood glucose meter kit and supplies KIT Dispense based on patient and insurance preference. Use up to four times daily as directed. 1 each 0   Calcium Carb-Cholecalciferol (CALCIUM PLUS VITAMIN D3 PO) Take 1 tablet by mouth in the morning and at bedtime.     Coenzyme Q10 (COQ10) 200 MG CAPS Take 200 mg by mouth daily.     Continuous Glucose Sensor (FREESTYLE LIBRE 3 PLUS SENSOR) MISC Use to check blood sugar continuously. Change sensor every 15 days. 6 each 1   cyclobenzaprine  (FLEXERIL ) 10 MG tablet Take 1 tablet (10 mg total) by mouth 2 (two) times daily as needed for muscle spasms. 20 tablet 0   docusate sodium  (COLACE) 100 MG capsule Take 1 capsule (100 mg total) by mouth every 12 (twelve)  hours. (Patient taking differently: Take 100 mg by mouth daily as needed for moderate constipation.) 60 capsule 0   ELIQUIS  5 MG TABS tablet TAKE 1 TABLET BY MOUTH TWICE A DAY 180 tablet 2   esomeprazole (NEXIUM) 20 MG capsule Take 20 mg by mouth in the morning and at bedtime.     gabapentin  (NEURONTIN ) 300 MG capsule Take 1 capsule (300 mg total) by mouth 2 (two) times daily. For back pain. 180 capsule 1   glucose blood (ONETOUCH ULTRA) test strip USE UP TO 4 TIMES DAILY AS DIRECTED 400 strip 5   HYDROcodone  bit-homatropine (HYCODAN) 5-1.5 MG/5ML syrup Take 5 mLs by mouth every 6 (six) hours as needed for cough. 120 mL 0   insulin  aspart (NOVOLOG  FLEXPEN) 100 UNIT/ML FlexPen Inject 1-9 Units into the skin 3 (three) times daily with meals as needed for high blood sugar (while on chemotherapy). (Patient not taking: Reported on 10/04/2024)     Insulin  Pen Needle (BD PEN NEEDLE NANO U/F) 32G X 4 MM MISC Use with insulin  as prescribed Dx Code:  E11.9 100 each 3   ipratropium-albuterol  (DUONEB) 0.5-2.5 (3) MG/3ML SOLN Take 3 mLs by nebulization every 6 (six) hours as needed (SOB/Wheezing).     KRILL OIL PO Take 1 capsule by mouth daily.     Lancets MISC USE UP TO 3 TIMES DAILY AS DIRECTED 100 each 2   lidocaine  (LIDODERM ) 5 % Place 1 patch onto the skin daily. Remove & Discard patch within 12 hours or as directed by MD 30 patch 3   metFORMIN  (GLUCOPHAGE ) 1000 MG tablet TAKE 1 TABLET (1,000 MG TOTAL) BY MOUTH 2 (TWO) TIMES DAILY WITH A MEAL. FOR DIABETES 180 tablet 0   mirtazapine  (REMERON ) 15 MG tablet TAKE 1 TABLET BY MOUTH EVERYDAY AT BEDTIME 90 tablet 1   morphine  (MS CONTIN ) 30 MG 12 hr tablet Take 1 tablet (30 mg total) by mouth every 8 (eight) hours as needed. (Patient taking differently: Take 30 mg by mouth every 6 (six) hours as needed for pain.) 90 tablet 0   Multiple Vitamin (MULTI-VITAMINS) TABS Take 1 tablet by mouth daily with breakfast.     Naloxone  HCl 3 MG/0.1ML LIQD Place 1 spray into both nostrils once. For known/suspected opiod overdose. Every 2-3 minutes in alternating nostril till EMS arrives.     ondansetron  (ZOFRAN ) 8 MG tablet Take 1 tablet (8 mg total) by mouth every 8 (eight) hours as needed for nausea or vomiting. 30 tablet 3   oxyCODONE -acetaminophen  (PERCOCET) 10-325 MG tablet Take 1-1.5 tablets by mouth every 4 (four) hours as needed for pain. 90 tablet 0   pioglitazone  (ACTOS ) 45 MG tablet TAKE 1 TABLET (45 MG TOTAL) BY MOUTH DAILY. FOR DIABETES. 90 tablet 1   polyethylene glycol powder (GLYCOLAX /MIRALAX ) 17 GM/SCOOP powder Take 17 g by mouth daily as needed for moderate constipation or mild constipation.     pravastatin  (PRAVACHOL ) 40 MG tablet TAKE 1 TABLET BY MOUTH EVERY DAY IN THE EVENING FOR CHOLESTEROL 90 tablet 2   PRESCRIPTION MEDICATION CPAP- At bedtime     testosterone  cypionate (DEPOTESTOSTERONE CYPIONATE) 200 MG/ML injection Inject 200 mg into the muscle every 14 (fourteen) days.     No current  facility-administered medications for this visit.   Facility-Administered Medications Ordered in Other Visits  Medication Dose Route Frequency Provider Last Rate Last Admin   heparin  lock flush 100 unit/mL  500 Units Intracatheter Once Adam Wise Sherrod, MD       sodium chloride   flush (NS) 0.9 % injection 10 mL  10 mL Intracatheter Once Adam Wise Sherrod, MD        SURGICAL HISTORY:  Past Surgical History:  Procedure Laterality Date   CHEST TUBE INSERTION Right 01/20/2022   Procedure: CHEST TUBE INSERTION;  Surgeon: Claudene Toribio BROCKS, MD;  Location: [facility] ENDOSCOPY;  Service: Pulmonary;  Laterality: Right;  w/ Talc  Pleurodesis, planned admit for Obs afterwards   CHEST TUBE INSERTION Right 04/28/2022   Procedure: INSERTION PLEURAL DRAINAGE CATHETER - Pigtail tail, drainage;  Surgeon: Claudene Toribio BROCKS, MD;  Location: [facility] ENDOSCOPY;  Service: Pulmonary;  Laterality: Right;  talc  pleurodesis   CHEST TUBE INSERTION Right 09/01/2022   Procedure: INSERTION PLEURAL DRAINAGE CATHETER;  Surgeon: Alaine Vicenta NOVAK, MD;  Location: [facility] ENDOSCOPY;  Service: Cardiopulmonary;  Laterality: Right;  pigtail catheter placement with talc  pleurodesis   COLONOSCOPY WITH PROPOFOL  N/A 12/17/2018   Procedure: COLONOSCOPY WITH PROPOFOL ;  Surgeon: Janalyn Keene NOVAK, MD;  Location: [facility] ENDOSCOPY;  Service: Gastroenterology;  Laterality: N/A;   IR IMAGING GUIDED PORT INSERTION  04/17/2020   IR IVC FILTER RETRIEVAL / S&I /IMG GUID/MOD SED  09/19/2024   IR IVC FILTER RETRIEVAL / S&I PORTER GUID/MOD SED  09/19/2024   IR THORACENTESIS ASP PLEURAL SPACE W/IMG GUIDE  11/29/2021   IR THORACENTESIS ASP PLEURAL SPACE W/IMG GUIDE  01/03/2022   JOINT REPLACEMENT Bilateral    RADIOLOGY WITH ANESTHESIA N/A 09/19/2024   Procedure: RADIOLOGY WITH ANESTHESIA;  Surgeon: Radiologist, Medication, MD;  Location: [facility] ORS;  Service: Radiology;  Laterality: N/A;   REPLACEMENT TOTAL KNEE BILATERAL  2015   TALC  PLEURODESIS  09/01/2022   Procedure: TALC   PLEURADESIS;  Surgeon: Alaine Vicenta NOVAK, MD;  Location: [facility] ENDOSCOPY;  Service: Cardiopulmonary;;   TONSILLECTOMY  1960   TRANSTHORACIC ECHOCARDIOGRAM  05/2020   a) 05/2020: EF 55 to 60%.  No R WMA.  Mild LVH.  Indeterminate LVEDP.  Unable to assess RVP.  Normal aortic and mitral valves.  Mildly elevated RAP.; b) 09/2021: EF 50-55%. No RWMA. Mild LVH. ~ LVEDP. Mild LA Dilation. NORMAL RV/RAP.  Normal MV/AoV.    REVIEW OF SYSTEMS:  Constitutional: positive for fatigue and weight loss Eyes: negative Ears, nose, mouth, throat, and face: negative Respiratory: positive for dyspnea on exertion Cardiovascular: negative Gastrointestinal: positive for abdominal pain and nausea Genitourinary:negative Integument/breast: negative Hematologic/lymphatic: negative Musculoskeletal:positive for back pain Neurological: negative Behavioral/Psych: negative Endocrine: negative Allergic/Immunologic: negative   PHYSICAL EXAMINATION: General appearance: alert, cooperative, fatigued, and no distress Head: Normocephalic, without obvious abnormality, atraumatic Neck: no adenopathy, no JVD, supple, symmetrical, trachea midline, and thyroid  not enlarged, symmetric, no tenderness/mass/nodules Lymph nodes: Cervical, supraclavicular, and axillary nodes normal. Resp: clear to auscultation bilaterally Back: symmetric, no curvature. ROM normal. No CVA tenderness. Cardio: regular rate and rhythm, S1, S2 normal, no murmur, click, rub or gallop GI: soft, non-tender; bowel sounds normal; no masses,  no organomegaly Extremities: extremities normal, atraumatic, no cyanosis or edema Neurologic: Alert and oriented X 3, normal strength and tone. Normal symmetric reflexes. Normal coordination and gait  ECOG PERFORMANCE STATUS: 1 - Symptomatic but completely ambulatory  Blood pressure 109/73, pulse (!) 139, temperature 97.6 F (36.4 C), temperature source Temporal, resp. rate 17, height 5' 9 (1.753 m), weight 217 lb (98.4  kg), SpO2 95%.  LABORATORY DATA: Lab Results  Component Value Date   WBC 4.3 10/12/2024   HGB 8.6 (L) 10/12/2024   HCT 27.0 (L) 10/12/2024   MCV 96.4 10/12/2024   PLT 216 10/12/2024  Chemistry      Component Value Date/Time   NA 135 10/03/2024 1114   K 3.5 10/03/2024 1114   CL 96 (L) 10/03/2024 1114   CO2 28 10/03/2024 1114   BUN 18 10/03/2024 1114   CREATININE 0.71 10/03/2024 1114   CREATININE 1.45 (H) 10/18/2021 1533      Component Value Date/Time   CALCIUM 8.6 (L) 10/03/2024 1114   ALKPHOS 157 (H) 10/03/2024 1114   AST 27 10/03/2024 1114   ALT 24 10/03/2024 1114   BILITOT 0.6 10/03/2024 1114       RADIOGRAPHIC STUDIES: CT CHEST ABDOMEN PELVIS W CONTRAST Result Date: 10/09/2024 CLINICAL DATA:  Small-cell lung cancer restaging * Tracking Code: BO * EXAM: CT CHEST, ABDOMEN, AND PELVIS WITH CONTRAST TECHNIQUE: Multidetector CT imaging of the chest, abdomen and pelvis was performed following the standard protocol during bolus administration of intravenous contrast. RADIATION DOSE REDUCTION: This exam was performed according to the departmental dose-optimization program which includes automated exposure control, adjustment of the mA and/or kV according to patient size and/or use of iterative reconstruction technique. CONTRAST:  OMNIPAQUE  IOHEXOL  300 MG/ML  SOLN COMPARISON:  08/13/2024 FINDINGS: CT CHEST FINDINGS Cardiovascular: Right chest port catheter. Aortic atherosclerosis. Normal heart size. Three-vessel coronary artery calcifications. Small pericardial effusion. Mediastinum/Nodes: Unchanged appearance of treated soft tissue about the right hilum and prominent subcentimeter pretracheal lymph nodes (series 2, image 26, 24). Thyroid  gland, trachea, and esophagus demonstrate no significant findings. Lungs/Pleura: Slight interval increase in dense, masslike consolidation and atelectasis of the right apex, the right lung now almost completely consolidated. Additional  extensive perihilar consolidation of the right middle and right lower lobes. Small, loculated right pleural effusion with associated pleural thickening and hyperdensity, in keeping with talc  pleurodesis, unchanged. Left lung is normally aerated. Musculoskeletal: No chest wall abnormality. No acute osseous findings. CT ABDOMEN PELVIS FINDINGS Hepatobiliary: No solid liver abnormality is seen. Unchanged appearance of a treated, predominantly cystic metastasis in the superior liver dome, hepatic segment VIII, measuring 5.7 x 3.4 cm (series 2, image 47). No gallstones, gallbladder wall thickening, or biliary dilatation. Pancreas: Unremarkable. No pancreatic ductal dilatation or surrounding inflammatory changes. Spleen: Normal in size without significant abnormality. Adrenals/Urinary Tract: Adrenal glands are unremarkable. Benign bilateral renal cortical cysts for which no further follow-up or characterization is required. Kidneys are otherwise normal, without renal calculi, solid lesion, or hydronephrosis. Thickening of the urinary bladder, likely secondary to chronic outlet obstruction. Stomach/Bowel: Stomach is within normal limits. Appendix appears normal. No evidence of bowel wall thickening, distention, or inflammatory changes. Vascular/Lymphatic: Aortic atherosclerosis. No significant change in porta hepatis and portacaval lymph nodes, measuring up to 3.5 x 2.3 cm (series 2, image 61). Reproductive: No mass or other abnormality. Other: No abdominal wall hernia. Right lower quadrant subcutaneous infusion pump. No ascites. Musculoskeletal: No acute osseous findings. IMPRESSION: 1. Slight interval increase in dense, masslike consolidation and atelectasis of the right apex, the right lung now almost completely consolidated. Additional extensive perihilar consolidation of the right middle and right lower lobes. 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 2. Unchanged appearance  of treated soft tissue about the right hilum and prominent subcentimeter pretracheal lymph nodes. 3. Small, loculated right pleural effusion with associated pleural thickening and hyperdensity, in keeping with talc  pleurodesis, unchanged. 4. Unchanged treated metastasis in the liver dome. 5. No significant change in portacaval and porta hepatis lymphadenopathy. 6. Coronary artery disease. Aortic Atherosclerosis (ICD10-I70.0). Electronically Signed   By: Marolyn JONETTA Marlyce CHRISTELLA.D.  On: 10/09/2024 09:16   MR Brain W Wo Contrast Result Date: 09/29/2024 EXAM: MRI BRAIN WITH AND WITHOUT CONTRAST 09/29/2024 06:22:48 PM TECHNIQUE: Multiplanar multisequence MRI of the head/brain was performed with and without the administration of 10mL gadobutrol  (GADAVIST ) 1 MMOL/ML injection 10 mL GADOBUTROL  1 MMOL/ML IV SOLN. COMPARISON: Comparison made with prior study from 09/06/2024. CLINICAL HISTORY: Metastatic disease evaluation; 3T SRS Protocol for radiation treatment planning. FINDINGS: BRAIN AND VENTRICLES: No acute infarct. No acute intracranial hemorrhage. Single chronic microhemorrhage noted within the peripheral left cerebellar hemisphere, stable. No mass effect or midline shift. No hydrocephalus. The sella is unremarkable. Normal flow voids. Enhancing mass involving the peripheral aspect of the inferior right cerebellum again seen lesion measures 1.1 x 0.6 x 1.0 cm (series 1100, image 128). Additional enhancing lesion involving the peripheral parafalcine left parietal cortex measures 1.0 x 0.8 x 1.1 cm (series 11, image 245). Associated diffusion and FLAIR signal abnormality again noted. No significant surrounding edema or regional mass effect. Overall, changes are concerning for metastatic disease, and are not significantly changed as compared to prior brain MRI from 09/06/2024. No new lesions identified. Note made of a small linear focus of enhancement involving the mid right cerebellar hemisphere (series 1100, image 150)  felt to be consistent with a vascular structure. This is better seen on prior MRI. ORBITS: No acute abnormality. SINUSES: No acute abnormality. BONES AND SOFT TISSUES: Normal bone marrow signal and enhancement. No acute soft tissue abnormality. IMPRESSION: 1. Enhancing lesions involving the inferior right cerebellum and left parietal cortex as detailed above, concerning for metastatic disease. No significant surrounding edema or regional mass effect. 2. Overall, appearance is stable as compared to prior MRI from 09/06/24. No new lesions. Electronically signed by: Morene Hoard MD 09/29/2024 09:42 PM EDT RP Workstation: [id number]   IR INTRATHECAL CATHETER PLACEMENT Addendum Date: 09/27/2024 ADDENDUM REPORT: 09/27/2024 08:52 ADDENDUM: Template error in original report, CLINICAL DATA should read: history significant of metastatic lung cancer, T2DM, HTN, PE on Eliquis , GERD, HLD, COPD and chronic back pain. Pt has had multiple hospitalizations due to pain exacerbations. He is on multiple narcotic PO pain medications and a 5% lidocaine  patch in attempt to control his back pain. Previous epidural steroid injections have provided minimal relief of pain. Pt continues to have uncontrolled back pain. Electronically Signed   By: JONETTA Faes M.D.   On: 09/27/2024 08:52   Result Date: 09/27/2024 CLINICAL DATA:  GI bleed. EXAM: IR INTRATHECAL PUMP PLACEMENT ANESTHESIA/SEDATION: MAC per department of anesthesia MEDICATIONS: Lidocaine  1% subcutaneous CONTRAST:  None required PROCEDURE: Informed consent was obtained from the patient prior to the procedure, including potential complications of headache,allergy,and pain. Port pocket was discussed and marked on the right lower abdomen with the patient sitting. Patient placed left lateral decubitus and padded. A time-out was performed. Lumbar and right lower quadrant anterior abdominal surgical areas prepped with betadine, draped in usualsterile fashion. Under fluoroscopy,  an appropriate skin entry site was determined for lumbar puncture. Region was infiltrated locally with 1% lidocaine . 16 gauge Touhy needle advanced into the thecal space under fluoroscopy at L2-3 from right parasagital approach. CSFspontaneously returned through the needle. Intrathecal catheter was advanced to the T9 level until resistance encountered. Small vertical incision was created below the needle. A 4-0-silk pursestring suture was placed deep subcutaneous around the needle which was then withdrawn and the suture secured to prevent CSF leak. The anchor device was then advanced over the catheter and secured in the deep subcutaneous tissues with two  4- 0 silk sutures. Attention was then turned to the port pocket. A 7 cm Transverse incision was made at the superior margin of the plannedport pocket. Subcutaneous pocket was created with a combination of blunt and sharp dissection. Hemostasis was achieved. Test fit of thepump was performed. A tunneling device was used to create a tunnel from the lumbar incision to the pump pocket incision. CatheterPlacement details were recorded, and the catheter was passed through The tunneling device and secured. The catheter was then connected tothe implanted pump. The pump was Secured with 0 silk retention sutures at the 4 corners. Test Gentle aspiration returned CSF, confirming patency of the tubing. Wounds were closed in layers using deep and subcuticular 3-0 Monocryl, Dermabond applied, and dressings applied. The patienttolerated the procedure well. IMPRESSION:Technically successful placement of intrathecal catheter andimplanted pump for pain control COMPLICATIONS: None immediate FINDINGS: Intrathecal catheter advanced to the T9 level.Technically successful tunneled catheter with implanted pump placement right lower quadrant. IMPRESSION: Technically successful placement of intrathecal catheter and implanted pump for pain control. Electronically Signed: By: JONETTA Faes M.D.  On: 09/27/2024 08:49   IR INTRATHECAL PUMP PLACEMENT Result Date: 09/19/2024 CLINICAL DATA:  GI bleed. EXAM: IR INTRATHECAL PUMP PLACEMENT ANESTHESIA/SEDATION: MAC per department of anesthesia MEDICATIONS: Lidocaine  1% subcutaneous CONTRAST:  None required PROCEDURE: Informed consent was obtained from the patient prior to the procedure, including potential complications of headache,allergy,and pain. Port pocket was discussed and marked on the right lower abdomen with the patient sitting. Patient placed left lateral decubitus and padded. A time-out was performed. Lumbar and right lower quadrant anterior abdominal surgical areas prepped with betadine, draped in usualsterile fashion. Under fluoroscopy, an appropriate skin entry site was determined for lumbar puncture. Region was infiltrated locally with 1% lidocaine . 16 gauge Touhy needle advanced into the thecal space under fluoroscopy at L2-3 from right parasagital approach. CSFspontaneously returned through the needle. Intrathecal catheter was advanced to the T9 level until resistance encountered. Small vertical incision was created below the needle. A 4-0-silk pursestring suture was placed deep subcutaneous around the needle which was then withdrawn and the suture secured to prevent CSF leak. The anchor device was then advanced over the catheter and secured in the deep subcutaneous tissues with two 4- 0 silk sutures. Attention was then turned to the port pocket. A 7 cm Transverse incision was made at the superior margin of the plannedport pocket. Subcutaneous pocket was created with a combination of blunt and sharp dissection. Hemostasis was achieved. Test fit of thepump was performed. A tunneling device was used to create a tunnel from the lumbar incision to the pump pocket incision. CatheterPlacement details were recorded, and the catheter was passed through The tunneling device and secured. The catheter was then connected tothe implanted pump. The pump was  Secured with 0 silk retention sutures at the 4 corners. Test Gentle aspiration returned CSF, confirming patency of the tubing. Wounds were closed in layers using deep and subcuticular 3-0 Monocryl, Dermabond applied, and dressings applied. The patienttolerated the procedure well. IMPRESSION:Technically successful placement of intrathecal catheter andimplanted pump for pain control. COMPLICATIONS: None immediate FINDINGS: Intrathecal catheter advanced to the T9 level.Technically successful tunneled catheter with implanted pump placement right lower quadrant. IMPRESSION: Technically successful placement of intrathecal catheter and implanted pump for pain control. Electronically Signed   By: JONETTA Faes M.D.   On: 09/19/2024 14:56    ASSESSMENT AND PLAN: This is a very pleasant 73 year old Caucasian male diagnosed with extensive stage (T4, N3, M1C) small  cell lung cancer presented with large right upper lobe lung mass in addition to mediastinal and right supraclavicular lymphadenopathy and multiple metastatic liver lesions diagnosed in April 2021. The patient initially underwent systemic chemotherapy with carboplatin  for an AUC of 5 on day 1, etoposide  100 mg per metered squared on days 1, 2, and 3 in addition to Cosela  for myeloprotection.  He also received immunotherapy with Imfinzi  on day 1 of every chemotherapy cycle.  He is status post 9 cycles.  Starting from cycle #5 he was on single agent immunotherapy with Imfinzi  IV every 4 weeks.  The patient had evidence of disease progression on his scan from December 2021.  He was then started on systemic chemotherapy with carboplatin  for an AUC of 5 on day 1, etoposide  100 mg per metered squared on days 1, 2, and 3 in addition to Tecentriq  1200 mg on day 1, the patient is status post 9 cycles.  Starting from cycle #5, the patient started maintenance immunotherapy with Tecentriq .  This was discontinued due to evidence of disease progression. The patient started  third line treatment with Zepzelca  (lurbinectedin ) 3.2 Mg/M2 every 3-week status post 12 cycles of this treatment and he has been tolerating it fairly well except for fatigue.  This treatment was discontinued secondary to disease progression. He underwent fourth line treatment with systemic chemotherapy with irinotecan  65 Mg/M2 on days 1 and 8 every 3 weeks.  Status post 3 cycles.  He tolerated this treatment well with no concerning adverse effect except for mild fatigue. Unfortunately his scan showed evidence for disease progression especially in the liver. I recommended for the patient to discontinue his current treatment with irinotecan  at this point. He started systemic chemotherapy again with carboplatin  for AUC of 5 on day 1, etoposide  100 Mg/M2 on days 1, 2 and 3 with Cosela  status post 15 cycles. I did reduce the dose of carboplatin  to AUC of 4 and etoposide  80 Mg/M2 starting from cycle #5.  He has been tolerating this treatment fairly well with no concerning complaints except for the increasing fatigue and weakness as well as the chemotherapy-induced pancytopenia. He had repeat CT scan of the chest, abdomen and pelvis performed recently.  I personally and independently reviewed the scan images and discussed the result and showed the images to the patient and his wife.  His scan showed redemonstration of the posttreatment/postradiation appearance of the right chest with dense fibrosis and consolidation of the perihilar right lung as well as a small loculated appearing right pleural effusion with associated pleural densities suggesting prior talc  pleurodesis.  There was significantly increased areas of masslike consolidation in the right lung for example in the peripheral right upper lobe and superior segment of the right lower lobe with extensive new associated nodularity concerning for local malignant recurrence. The patient is also more symptomatic with more dry cough and shortness of breath. I  recommended for him to hold any additional treatment with chemotherapy for now. The patient was seen by Dr. Boyd at [facility] and treated with tarlatamab which is a by specific T-cell engager (BiTE) for the last 8 months.  Unfortunately he had evidence for disease progression after this treatment and it was discontinued. He started systemic chemotherapy with Topotecan  1.2 mg/M2 on days 1-5 every 3 weeks with Cosela  240 Mg/M2 IV before the chemotherapy.  First dose January 04, 2024.  Status post 8 cycles.  This treatment was discontinued secondary to disease progression and the patient started palliative systemic chemotherapy  again with reduced dose carboplatin  and Etoposide  with Cosela  before the chemotherapy status post 3 cycles.  He has been tolerating his treatment well except for the fatigue and pancytopenia. He had repeat CT scan of the chest, abdomen and pelvis performed recently.  I personally independently reviewed the scan and discussed the result with the patient and his wife.  His scan showed no significant disease progression. 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 Currently on reduced dose carboplatin  and etoposide  after progression on topotecan . Recent CT scan of chest, abdomen, and pelvis shows no significant progression. Hemoglobin level is 8.6, slightly improved from previous 8.2-8.3. Small right pleural effusion present, not causing significant respiratory distress. - Hold chemotherapy today - Administer IV fluids - Reassess next week for potential resumption of chemotherapy  Cancer-related pain Experiencing back pain, possibly related to cancer progression. Pain management with current regimen is insufficient. - Administer IV pain medication  Chemotherapy-induced nausea, vomiting, diarrhea, and constipation Reports of nausea, vomiting, diarrhea, and constipation over  the past week. No anti-nausea medication taken at home. - Administer IV Zofran  for nausea - Administer IV fluids to address dehydration He was advised to call immediately if he has any other concerning symptoms in the interval.   The patient voices understanding of current disease status and treatment options and is in agreement with the current care plan.  All questions were answered. The patient knows to call the clinic with any problems, questions or concerns. We can certainly see the patient much sooner if necessary. The total time spent in the appointment was 30 minutes including review of chart and various tests results, discussions about plan of care and coordination of care plan .   Disclaimer: This note was dictated with voice recognition software. Similar sounding words can inadvertently be transcribed and may not be corrected upon review.

## 2024-10-12 NOTE — Progress Notes (Signed)
 Palliative Medicine [facility]  [phone] [phone]   Name: Adam Wise Date: 10/12/2024 MRN: [id number]  DOB: June 01, 1951  Patient Care Team: [name], NP as PCP - General (Internal Medicine) Anner Alm ORN, MD as PCP - Cardiology (Cardiology) Evertt Lonell BROCKS, RN as Oncology Nurse Navigator Pickenpack-Cousar, Fannie SAILOR, NP as Nurse Practitioner (Hospice and Palliative Medicine) Heilingoetter, Calton CROME, PA-C as Physician Assistant (Physician Assistant) Vivienne Lonni Ingle, NP as Nurse Practitioner (Cardiology)    INTERVAL HISTORY: Adam Wise is a 73 y.o. 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 Palliative ask to see for symptom management and goals of care.   SOCIAL HISTORY:     reports that he has quit smoking. He has never been exposed to tobacco smoke. He has never used smokeless tobacco. He reports that he does not currently use alcohol. He reports that he does not currently use drugs.  ADVANCE DIRECTIVES:  Advanced directives on file naming Sigmund Morera, [name], [name], and [name] as decision makers for this pt should Mr.Pendelton become unable to speak for himself.   CODE STATUS: Full code  PAST MEDICAL HISTORY: Past Medical History:  Diagnosis Date   Acute on chronic respiratory failure with hypoxia (HCC) 10/12/2021   Anxiety    Arthritis    Chickenpox    Chronic knee pain    Chronic low back pain    COPD exacerbation (HCC) 10/11/2021   COPD with exacerbation (HCC) 10/12/2021   Coronary artery calcification seen on CAT scan 11/2021   Coronary CTA 11/27/2021: Moderate to large right pleural effusion and compressive atelectasis right lung base. => Coronary Calcium Score 657.  Diffuse RCA calcification.  Minimal mild disease in the LAD and diagonal branches. == Overall limited study.  Notable  artifact.   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 He has disease progression after the first line of chemotherapy in December 2021.   Testosterone  deficiency    Type 2 diabetes mellitus (HCC)     ALLERGIES:  is allergic to bupropion, hydrochlorothiazide, and lisinopril .  MEDICATIONS:  Current Outpatient Medications  Medication Sig Dispense Refill   albuterol  (VENTOLIN  HFA) 108 (90 Base) MCG/ACT inhaler Inhale 2 puffs into the lungs every 4 (four) hours as needed for shortness of breath. 18 each 0   B-D 3CC LUER-LOK SYR 22GX1 22G X 1 3 ML MISC USE AS INSTRUCTED FOR TESTOSTERONE  INJECTION EVERY 2 WEEKS 10 each 2   Black Pepper-Turmeric (TURMERIC COMPLEX/BLACK PEPPER PO) Take 1 tablet by mouth in the morning and at bedtime.     blood glucose meter kit and supplies KIT Dispense based on patient and insurance preference. Use up to four times daily as directed. 1 each 0   Calcium Carb-Cholecalciferol (CALCIUM PLUS VITAMIN D3 PO) Take 1 tablet by mouth in the morning and at bedtime.     Coenzyme Q10 (COQ10) 200 MG CAPS Take 200  mg by mouth daily.     Continuous Glucose Sensor (FREESTYLE LIBRE 3 PLUS SENSOR) MISC Use to check blood sugar continuously. Change sensor every 15 days. 6 each 1   cyclobenzaprine  (FLEXERIL ) 10 MG tablet Take 1 tablet (10 mg total) by mouth 2 (two) times daily as needed for muscle spasms. 20 tablet 0   docusate sodium  (COLACE) 100 MG capsule Take 1 capsule (100 mg total) by  mouth every 12 (twelve) hours. (Patient taking differently: Take 100 mg by mouth daily as needed for moderate constipation.) 60 capsule 0   ELIQUIS  5 MG TABS tablet TAKE 1 TABLET BY MOUTH TWICE A DAY 180 tablet 2   esomeprazole (NEXIUM) 20 MG capsule Take 20 mg by mouth in the morning and at bedtime.     gabapentin  (NEURONTIN ) 300 MG capsule Take 1 capsule (300 mg total) by mouth 2 (two) times daily. For back pain. 180 capsule 1   glucose blood (ONETOUCH ULTRA) test strip USE UP TO 4 TIMES DAILY AS DIRECTED 400 strip 5   HYDROcodone  bit-homatropine (HYCODAN) 5-1.5 MG/5ML syrup Take 5 mLs by mouth every 6 (six) hours as needed for cough. 120 mL 0   insulin  aspart (NOVOLOG  FLEXPEN) 100 UNIT/ML FlexPen Inject 1-9 Units into the skin 3 (three) times daily with meals as needed for high blood sugar (while on chemotherapy). (Patient not taking: Reported on 10/04/2024)     Insulin  Pen Needle (BD PEN NEEDLE NANO U/F) 32G X 4 MM MISC Use with insulin  as prescribed Dx Code: E11.9 100 each 3   ipratropium-albuterol  (DUONEB) 0.5-2.5 (3) MG/3ML SOLN Take 3 mLs by nebulization every 6 (six) hours as needed (SOB/Wheezing).     KRILL OIL PO Take 1 capsule by mouth daily.     Lancets MISC USE UP TO 3 TIMES DAILY AS DIRECTED 100 each 2   lidocaine  (LIDODERM ) 5 % Place 1 patch onto the skin daily. Remove & Discard patch within 12 hours or as directed by MD 30 patch 3   metFORMIN  (GLUCOPHAGE ) 1000 MG tablet TAKE 1 TABLET (1,000 MG TOTAL) BY MOUTH 2 (TWO) TIMES DAILY WITH A MEAL. FOR DIABETES 180 tablet 0   mirtazapine  (REMERON ) 15 MG tablet TAKE 1 TABLET BY MOUTH EVERYDAY AT BEDTIME 90 tablet 1   morphine  (MS CONTIN ) 30 MG 12 hr tablet Take 1 tablet (30 mg total) by mouth every 8 (eight) hours as needed. (Patient taking differently: Take 30 mg by mouth every 6 (six) hours as needed for pain.) 90 tablet 0   Multiple Vitamin (MULTI-VITAMINS) TABS Take 1 tablet by mouth daily with breakfast.     Naloxone  HCl 3 MG/0.1ML LIQD  Place 1 spray into both nostrils once. For known/suspected opiod overdose. Every 2-3 minutes in alternating nostril till EMS arrives.     ondansetron  (ZOFRAN ) 8 MG tablet Take 1 tablet (8 mg total) by mouth every 8 (eight) hours as needed for nausea or vomiting. 30 tablet 3   oxyCODONE -acetaminophen  (PERCOCET) 10-325 MG tablet Take 1-1.5 tablets by mouth every 4 (four) hours as needed for pain. 90 tablet 0   pioglitazone  (ACTOS ) 45 MG tablet TAKE 1 TABLET (45 MG TOTAL) BY MOUTH DAILY. FOR DIABETES. 90 tablet 1   polyethylene glycol powder (GLYCOLAX /MIRALAX ) 17 GM/SCOOP powder Take 17 g by mouth daily as needed for moderate constipation or mild constipation.     pravastatin  (PRAVACHOL ) 40 MG tablet TAKE 1 TABLET BY MOUTH EVERY DAY IN THE EVENING FOR CHOLESTEROL 90 tablet 2   PRESCRIPTION  MEDICATION CPAP- At bedtime     testosterone  cypionate (DEPOTESTOSTERONE CYPIONATE) 200 MG/ML injection Inject 200 mg into the muscle every 14 (fourteen) days.     No current facility-administered medications for this visit.   Facility-Administered Medications Ordered in Other Visits  Medication Dose Route Frequency Provider Last Rate Last Admin   heparin  lock flush 100 unit/mL  500 Units Intracatheter Once Sherrod Sherrod, MD       sodium chloride  flush (NS) 0.9 % injection 10 mL  10 mL Intracatheter Once Sherrod Sherrod, MD        VITAL SIGNS: There were no vitals taken for this visit. There were no vitals filed for this visit.   Estimated body mass index is 32.05 kg/m as calculated from the following:   Height as of an earlier encounter on 10/12/24: 5' 9 (1.753 m).   Weight as of an earlier encounter on 10/12/24: 217 lb (98.4 kg).   PERFORMANCE STATUS (ECOG) : 1 - Symptomatic but completely ambulatory   Physical Exam General: NAD Cardiovascular: regular rate and rhythm Pulmonary: normal breathing pattern Extremities: no edema, no joint deformities Skin: no rashes. Lower back insertion site,  clean and healing, no redness, right abdomen site clean and dry Neurological: AAO x3  IMPRESSION: Discussed the use of AI scribe software for clinical note transcription with the patient, who gave verbal consent to proceed.  History of Present Illness Adam Wise is a 73 year old male with lung cancer who I saw during infusion for symptom management follow-up. He is accompanied by his wife. Denies of nausea, vomiting, or diarrhea. He is taking Miralax  and Senna for his constipation.   Mr. Dave is reporting increase and worsening pain over the past 24 hours despite pain pump in place and administering boluses as available. Rates severity of 8 out of 10. Patient's pump interrogated and is working properly. Over the past 24-48 hours his average bolus is 1.58mg  and basal is at 0.5mg  per hour. PTM dosing at 0.02mg  every 6 hours as needed. I will increase basal dose to 0.6mg  per hour with PTM dosing of 0.03 mg every 4 hours as needed. Patient and wife verbalized understanding. Pump syncing successful and changes observed to PTM device. We will continue to closely monitor pain and adjust pump dosing accordingly.   I discussed the importance of continued conversation with family and their medical providers regarding overall plan of care and treatment options, ensuring decisions are within the context of the patients values and GOCs. Assessment & Plan Cancer-related pain Severe cancer-related pain, rated 8/10, has worsened over the past 24-48 hours. Current pain management regimen requires adjustment due to inadequate pain control. - Increase basal dose from 0.5 mg to 0.6 mg, a 20% increase. - Increase bolus dose from 0.2 mg to 0.3 mg. - Adjust bolus frequency to every 4 hours, allowing for up to 6 boluses per day. - Schedule follow-up to assess pain control after adjustments. - Pump successfully interrogated and PTM device successfully connected.  Opioid-induced constipation Opioid-induced  constipation managed with Miralax . No significant constipation issues reported recently, with one episode resolved with intervention. He is taking Miralax  as needed, with instructions to increase mobility as pain allows to further alleviate constipation. - Continue Miralax  as needed for constipation. -Senna S 2 tablets 1-2 times daily - Encourage increased mobility as pain allows to help alleviate constipation.  Poor appetite Poor appetite with occasional good intake during social events. -Encourage increase in nutrition. Small frequent meals.   I  will plan to see patient back in 1-2 weeks. Sooner if needed.  Patient expressed understanding and was in agreement with this plan. He also understands that He can call the clinic at any time with any questions, concerns, or complaints.   Any controlled substances utilized were prescribed in the context of palliative care. PDMP has been reviewed.   Visit consisted of counseling and education dealing with the complex and emotionally intense issues of symptom management and palliative care in the setting of serious and potentially life-threatening illness.  Levon Borer, AGPCNP-BC  Palliative Medicine Team/[facility]

## 2024-10-13 ENCOUNTER — Inpatient Hospital Stay

## 2024-10-14 ENCOUNTER — Inpatient Hospital Stay

## 2024-10-14 ENCOUNTER — Other Ambulatory Visit: Payer: Self-pay | Admitting: Radiation Therapy

## 2024-10-14 ENCOUNTER — Telehealth: Payer: Self-pay | Admitting: Internal Medicine

## 2024-10-14 DIAGNOSIS — I2510 Atherosclerotic heart disease of native coronary artery without angina pectoris: Secondary | ICD-10-CM | POA: Diagnosis not present

## 2024-10-14 DIAGNOSIS — E119 Type 2 diabetes mellitus without complications: Secondary | ICD-10-CM | POA: Diagnosis not present

## 2024-10-14 DIAGNOSIS — C3411 Malignant neoplasm of upper lobe, right bronchus or lung: Secondary | ICD-10-CM | POA: Diagnosis not present

## 2024-10-14 DIAGNOSIS — J449 Chronic obstructive pulmonary disease, unspecified: Secondary | ICD-10-CM | POA: Diagnosis not present

## 2024-10-14 DIAGNOSIS — J9611 Chronic respiratory failure with hypoxia: Secondary | ICD-10-CM | POA: Diagnosis not present

## 2024-10-14 DIAGNOSIS — I10 Essential (primary) hypertension: Secondary | ICD-10-CM | POA: Diagnosis not present

## 2024-10-14 DIAGNOSIS — C7931 Secondary malignant neoplasm of brain: Secondary | ICD-10-CM

## 2024-10-14 NOTE — Telephone Encounter (Signed)
 Scheduled patient for tx next week. Called and spoke with the patient's wife with the appointment details, she is aware.

## 2024-10-15 DIAGNOSIS — J449 Chronic obstructive pulmonary disease, unspecified: Secondary | ICD-10-CM | POA: Diagnosis not present

## 2024-10-15 DIAGNOSIS — J9611 Chronic respiratory failure with hypoxia: Secondary | ICD-10-CM | POA: Diagnosis not present

## 2024-10-15 DIAGNOSIS — I10 Essential (primary) hypertension: Secondary | ICD-10-CM | POA: Diagnosis not present

## 2024-10-15 DIAGNOSIS — C3411 Malignant neoplasm of upper lobe, right bronchus or lung: Secondary | ICD-10-CM | POA: Diagnosis not present

## 2024-10-15 DIAGNOSIS — I2510 Atherosclerotic heart disease of native coronary artery without angina pectoris: Secondary | ICD-10-CM | POA: Diagnosis not present

## 2024-10-15 DIAGNOSIS — E119 Type 2 diabetes mellitus without complications: Secondary | ICD-10-CM | POA: Diagnosis not present

## 2024-10-16 ENCOUNTER — Encounter: Payer: Self-pay | Admitting: Internal Medicine

## 2024-10-17 ENCOUNTER — Inpatient Hospital Stay: Attending: Radiation Oncology

## 2024-10-17 ENCOUNTER — Other Ambulatory Visit: Payer: Self-pay | Admitting: *Deleted

## 2024-10-17 ENCOUNTER — Other Ambulatory Visit: Payer: Self-pay

## 2024-10-17 ENCOUNTER — Inpatient Hospital Stay (HOSPITAL_BASED_OUTPATIENT_CLINIC_OR_DEPARTMENT_OTHER): Admitting: Internal Medicine

## 2024-10-17 ENCOUNTER — Inpatient Hospital Stay

## 2024-10-17 VITALS — BP 113/78 | HR 133 | Temp 97.6°F | Resp 17 | Ht 69.0 in | Wt 221.0 lb

## 2024-10-17 DIAGNOSIS — I700 Atherosclerosis of aorta: Secondary | ICD-10-CM | POA: Insufficient documentation

## 2024-10-17 DIAGNOSIS — C3411 Malignant neoplasm of upper lobe, right bronchus or lung: Secondary | ICD-10-CM | POA: Diagnosis not present

## 2024-10-17 DIAGNOSIS — E291 Testicular hypofunction: Secondary | ICD-10-CM | POA: Insufficient documentation

## 2024-10-17 DIAGNOSIS — J449 Chronic obstructive pulmonary disease, unspecified: Secondary | ICD-10-CM | POA: Diagnosis present

## 2024-10-17 DIAGNOSIS — T451X5A Adverse effect of antineoplastic and immunosuppressive drugs, initial encounter: Secondary | ICD-10-CM | POA: Insufficient documentation

## 2024-10-17 DIAGNOSIS — K219 Gastro-esophageal reflux disease without esophagitis: Secondary | ICD-10-CM | POA: Diagnosis present

## 2024-10-17 DIAGNOSIS — Z7901 Long term (current) use of anticoagulants: Secondary | ICD-10-CM | POA: Diagnosis not present

## 2024-10-17 DIAGNOSIS — R251 Tremor, unspecified: Secondary | ICD-10-CM | POA: Insufficient documentation

## 2024-10-17 DIAGNOSIS — C7951 Secondary malignant neoplasm of bone: Secondary | ICD-10-CM | POA: Insufficient documentation

## 2024-10-17 DIAGNOSIS — Z8249 Family history of ischemic heart disease and other diseases of the circulatory system: Secondary | ICD-10-CM | POA: Diagnosis not present

## 2024-10-17 DIAGNOSIS — K922 Gastrointestinal hemorrhage, unspecified: Secondary | ICD-10-CM | POA: Insufficient documentation

## 2024-10-17 DIAGNOSIS — C781 Secondary malignant neoplasm of mediastinum: Secondary | ICD-10-CM | POA: Diagnosis present

## 2024-10-17 DIAGNOSIS — F32A Depression, unspecified: Secondary | ICD-10-CM | POA: Diagnosis present

## 2024-10-17 DIAGNOSIS — N3289 Other specified disorders of bladder: Secondary | ICD-10-CM | POA: Insufficient documentation

## 2024-10-17 DIAGNOSIS — I10 Essential (primary) hypertension: Secondary | ICD-10-CM | POA: Diagnosis present

## 2024-10-17 DIAGNOSIS — J90 Pleural effusion, not elsewhere classified: Secondary | ICD-10-CM | POA: Insufficient documentation

## 2024-10-17 DIAGNOSIS — Z86718 Personal history of other venous thrombosis and embolism: Secondary | ICD-10-CM | POA: Diagnosis not present

## 2024-10-17 DIAGNOSIS — R319 Hematuria, unspecified: Secondary | ICD-10-CM

## 2024-10-17 DIAGNOSIS — Z825 Family history of asthma and other chronic lower respiratory diseases: Secondary | ICD-10-CM | POA: Diagnosis not present

## 2024-10-17 DIAGNOSIS — I2510 Atherosclerotic heart disease of native coronary artery without angina pectoris: Secondary | ICD-10-CM | POA: Diagnosis present

## 2024-10-17 DIAGNOSIS — Z794 Long term (current) use of insulin: Secondary | ICD-10-CM | POA: Diagnosis not present

## 2024-10-17 DIAGNOSIS — D649 Anemia, unspecified: Secondary | ICD-10-CM | POA: Diagnosis not present

## 2024-10-17 DIAGNOSIS — C787 Secondary malignant neoplasm of liver and intrahepatic bile duct: Secondary | ICD-10-CM | POA: Diagnosis present

## 2024-10-17 DIAGNOSIS — Z515 Encounter for palliative care: Secondary | ICD-10-CM | POA: Diagnosis not present

## 2024-10-17 DIAGNOSIS — E119 Type 2 diabetes mellitus without complications: Secondary | ICD-10-CM | POA: Insufficient documentation

## 2024-10-17 DIAGNOSIS — G893 Neoplasm related pain (acute) (chronic): Secondary | ICD-10-CM | POA: Diagnosis present

## 2024-10-17 DIAGNOSIS — D630 Anemia in neoplastic disease: Secondary | ICD-10-CM | POA: Diagnosis present

## 2024-10-17 DIAGNOSIS — Z96653 Presence of artificial knee joint, bilateral: Secondary | ICD-10-CM | POA: Diagnosis present

## 2024-10-17 DIAGNOSIS — N281 Cyst of kidney, acquired: Secondary | ICD-10-CM | POA: Insufficient documentation

## 2024-10-17 DIAGNOSIS — T300 Burn of unspecified body region, unspecified degree: Secondary | ICD-10-CM | POA: Insufficient documentation

## 2024-10-17 DIAGNOSIS — C3490 Malignant neoplasm of unspecified part of unspecified bronchus or lung: Secondary | ICD-10-CM | POA: Diagnosis not present

## 2024-10-17 DIAGNOSIS — Z9089 Acquired absence of other organs: Secondary | ICD-10-CM | POA: Insufficient documentation

## 2024-10-17 DIAGNOSIS — J8410 Pulmonary fibrosis, unspecified: Secondary | ICD-10-CM | POA: Insufficient documentation

## 2024-10-17 DIAGNOSIS — G4733 Obstructive sleep apnea (adult) (pediatric): Secondary | ICD-10-CM | POA: Insufficient documentation

## 2024-10-17 DIAGNOSIS — G8929 Other chronic pain: Secondary | ICD-10-CM | POA: Insufficient documentation

## 2024-10-17 DIAGNOSIS — D61810 Antineoplastic chemotherapy induced pancytopenia: Secondary | ICD-10-CM | POA: Insufficient documentation

## 2024-10-17 DIAGNOSIS — C7931 Secondary malignant neoplasm of brain: Secondary | ICD-10-CM | POA: Diagnosis not present

## 2024-10-17 DIAGNOSIS — R32 Unspecified urinary incontinence: Secondary | ICD-10-CM

## 2024-10-17 DIAGNOSIS — E7849 Other hyperlipidemia: Secondary | ICD-10-CM | POA: Diagnosis present

## 2024-10-17 DIAGNOSIS — E1169 Type 2 diabetes mellitus with other specified complication: Secondary | ICD-10-CM | POA: Diagnosis present

## 2024-10-17 DIAGNOSIS — M1990 Unspecified osteoarthritis, unspecified site: Secondary | ICD-10-CM | POA: Insufficient documentation

## 2024-10-17 DIAGNOSIS — R748 Abnormal levels of other serum enzymes: Secondary | ICD-10-CM | POA: Diagnosis not present

## 2024-10-17 DIAGNOSIS — Z7984 Long term (current) use of oral hypoglycemic drugs: Secondary | ICD-10-CM | POA: Diagnosis not present

## 2024-10-17 DIAGNOSIS — Z87891 Personal history of nicotine dependence: Secondary | ICD-10-CM | POA: Diagnosis not present

## 2024-10-17 DIAGNOSIS — R590 Localized enlarged lymph nodes: Secondary | ICD-10-CM | POA: Insufficient documentation

## 2024-10-17 DIAGNOSIS — W010XXA Fall on same level from slipping, tripping and stumbling without subsequent striking against object, initial encounter: Secondary | ICD-10-CM | POA: Diagnosis present

## 2024-10-17 DIAGNOSIS — J700 Acute pulmonary manifestations due to radiation: Secondary | ICD-10-CM | POA: Insufficient documentation

## 2024-10-17 DIAGNOSIS — Z888 Allergy status to other drugs, medicaments and biological substances status: Secondary | ICD-10-CM | POA: Insufficient documentation

## 2024-10-17 DIAGNOSIS — Z87448 Personal history of other diseases of urinary system: Secondary | ICD-10-CM

## 2024-10-17 DIAGNOSIS — Z79891 Long term (current) use of opiate analgesic: Secondary | ICD-10-CM | POA: Diagnosis not present

## 2024-10-17 DIAGNOSIS — Y842 Radiological procedure and radiotherapy as the cause of abnormal reaction of the patient, or of later complication, without mention of misadventure at the time of the procedure: Secondary | ICD-10-CM | POA: Insufficient documentation

## 2024-10-17 DIAGNOSIS — I3139 Other pericardial effusion (noninflammatory): Secondary | ICD-10-CM | POA: Insufficient documentation

## 2024-10-17 DIAGNOSIS — Z79899 Other long term (current) drug therapy: Secondary | ICD-10-CM | POA: Insufficient documentation

## 2024-10-17 DIAGNOSIS — R932 Abnormal findings on diagnostic imaging of liver and biliary tract: Secondary | ICD-10-CM | POA: Diagnosis not present

## 2024-10-17 LAB — CBC WITH DIFFERENTIAL (CANCER CENTER ONLY)
Abs Immature Granulocytes: 0.05 K/uL (ref 0.00–0.07)
Basophils Absolute: 0 K/uL (ref 0.0–0.1)
Basophils Relative: 1 %
Eosinophils Absolute: 0 K/uL (ref 0.0–0.5)
Eosinophils Relative: 0 %
HCT: 26.2 % — ABNORMAL LOW (ref 39.0–52.0)
Hemoglobin: 8.2 g/dL — ABNORMAL LOW (ref 13.0–17.0)
Immature Granulocytes: 1 %
Lymphocytes Relative: 6 %
Lymphs Abs: 0.3 K/uL — ABNORMAL LOW (ref 0.7–4.0)
MCH: 30.8 pg (ref 26.0–34.0)
MCHC: 31.3 g/dL (ref 30.0–36.0)
MCV: 98.5 fL (ref 80.0–100.0)
Monocytes Absolute: 1 K/uL (ref 0.1–1.0)
Monocytes Relative: 16 %
Neutro Abs: 4.6 K/uL (ref 1.7–7.7)
Neutrophils Relative %: 76 %
Platelet Count: 184 K/uL (ref 150–400)
RBC: 2.66 MIL/uL — ABNORMAL LOW (ref 4.22–5.81)
RDW: 19.9 % — ABNORMAL HIGH (ref 11.5–15.5)
WBC Count: 5.9 K/uL (ref 4.0–10.5)
nRBC: 0.3 % — ABNORMAL HIGH (ref 0.0–0.2)

## 2024-10-17 LAB — CMP (CANCER CENTER ONLY)
ALT: 136 U/L — ABNORMAL HIGH (ref 0–44)
AST: 123 U/L — ABNORMAL HIGH (ref 15–41)
Albumin: 2.9 g/dL — ABNORMAL LOW (ref 3.5–5.0)
Alkaline Phosphatase: 572 U/L — ABNORMAL HIGH (ref 38–126)
Anion gap: 9 (ref 5–15)
BUN: 12 mg/dL (ref 8–23)
CO2: 30 mmol/L (ref 22–32)
Calcium: 8.1 mg/dL — ABNORMAL LOW (ref 8.9–10.3)
Chloride: 97 mmol/L — ABNORMAL LOW (ref 98–111)
Creatinine: 0.81 mg/dL (ref 0.61–1.24)
GFR, Estimated: >60 mL/min (ref >60)
Glucose, Bld: 179 mg/dL — ABNORMAL HIGH (ref 70–99)
Potassium: 3.7 mmol/L (ref 3.5–5.1)
Sodium: 136 mmol/L (ref 135–145)
Total Bilirubin: 4.5 mg/dL (ref 0.0–1.2)
Total Protein: 5.7 g/dL — ABNORMAL LOW (ref 6.5–8.1)

## 2024-10-17 MED ORDER — SODIUM CHLORIDE 0.9 % IV SOLN: INTRAVENOUS | Status: DC

## 2024-10-17 NOTE — Progress Notes (Signed)
 CRITICAL VALUE STICKER  CRITICAL VALUE: Bilirubin 4.5  RECEIVER (on-site recipient of call): Rosina  DATE & TIME NOTIFIED: 11/3 @ 1118  MESSENGER (representative from lab): Almarie   MD NOTIFIED: Dr. Sherrod   TIME OF NOTIFICATION: 1120  RESPONSE: patient to receive IVF's and US  ordered

## 2024-10-17 NOTE — Progress Notes (Signed)
 [facility] [phone]   [phone]  OFFICE PROGRESS NOTE  [name], NP [address] [name] [address]  DIAGNOSIS: Relapsed extensive stage (T4, N3, M1c)  small cell lung cancer diagnosed in April 2021 and presented with large right upper lobe/right hilar mass with ipsilateral and contralateral mediastinal and right supraclavicular lymphadenopathy in addition to multiple liver lesions. He has disease progression after the first line of chemotherapy in December 2021.    PRIOR THERAPY:  1) Palliative radiotherapy to the right upper lobe lung mass under the care of Dr. Dewey. 2) Systemic chemotherapy with carboplatin  for AUC of 5 on day 1, etoposide  100 mg/M2 on days 1, 2 and 3 in addition to Imfinzi  1500 mg IV every 3 weeks with chemotherapy then every 4 weeks for maintenance if the patient has no evidence for progression.  He will also receive Cosela  240 mg/m2 on the days of the chemotherapy.  Status post 9 cycles.  Starting from cycle #5 the patient will be on maintenance treatment with immunotherapy with Imfinzi  1500 mg IV every 4 weeks. Last dose of chemotherapy was given on November 13, 2020. This treatment was discontinued secondary to disease progression 3) Systemic chemotherapy with carboplatin  for AUC of 5 on day 1, etoposide  100 mg/M2 on days 1, 2 and 3, Tecentriq  1200 mg IV every 3 weeks as well as Cosela  250 mg/M2 on the days of the chemotherapy every 3 weeks.  First dose December 18, 2020.  Status post 8 cycles. 4) Zepzelca  (lurbinectedin ) 3.2 mgm2 IV every 3 weeks. Last dose on 01/23/22. Status post 12 cycles.  5) Palliative systemic chemotherapy with irinotecan  65 mg/m2 on days 1 and 8 IV every 3 weeks.  Status post 3 cycles.  Last dose was given April 01, 2022 discontinued secondary to disease progression. 6) SBRT to the progressive liver lesions under the care of Dr. Dewey.  Last fraction January 27, 2023. 7) systemic  chemotherapy with carboplatin  for AUC of 5 on day 1, etoposide  100 Mg/M2 on days 1, 2 and 3 with Cosela  before the chemotherapy.  First dose expected to start on 05/28/2022.  Status post 15 cycles.  Starting from cycle #5 his carboplatin  will be reduced to AUC of 4 and 2 etoposide  80 Mg/M2.  Last dose was giving April 07, 2023 discontinued for suspicious disease progression. 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 Dewey. Last dose on 05/16/24.  11) Topotecan  1.2 mg/M2 on days 1-5 every 3 weeks with Cosela  240 Mg/M2 IV before the chemotherapy.  First dose January 11, 2024. Status post 8 cycles.     CURRENT THERAPY: Palliative systemic chemotherapy with carboplatin  on day 1 and etoposide  on days 1, 2, and 3 IV every 3 weeks.  First dose expected around 08/10/2024.  Dr. Sherrod will likely reduce the dose from cycle #1. He is status post 3 cycle.   INTERVAL HISTORY: Adam Wise 73 y.o. male returns to the clinic today for follow-up visit accompanied by his wife. Discussed the use of AI scribe software for clinical note transcription with the patient, who gave verbal consent to proceed.  History of Present Illness Adam Wise is a 73 year old male with metastatic extensive stage small cell lung cancer who presents for evaluation before starting cycle four of chemotherapy.  He is undergoing treatment for metastatic extensive  stage small cell lung cancer, initially diagnosed in April 2021. He is currently on systemic chemotherapy with reduced dose carboplatin  and etoposide . He feels better than last week after receiving fluids, although he still experiences significant weakness, with his legs shaking when standing.  He has a pain pump for pain management, which has been adjusted recently to allow administration every four hours instead of six, providing better relief. However, he continues to  experience weakness.  He has been experiencing a persistent cough at night, for which he uses a prescribed liquid cough medicine that helps him sleep. No new medications have been taken recently, aside from over-the-counter cough syrup.  Bilirubin levels have increased from 0.6 to 4.5, and other liver enzymes are also elevated. No pain is reported on the right side, where his pain pump is located.  A urine sample was requested but not yet provided. He plans to provide a sample after receiving fluids today.    MEDICAL HISTORY: Past Medical History:  Diagnosis Date   Acute on chronic respiratory failure with hypoxia (HCC) 10/12/2021   Anxiety    Arthritis    Chickenpox    Chronic knee pain    Chronic low back pain    COPD exacerbation (HCC) 10/11/2021   COPD with exacerbation (HCC) 10/12/2021   Coronary artery calcification seen on CAT scan 11/2021   Coronary CTA 11/27/2021: Moderate to large right pleural effusion and compressive atelectasis right lung base. => Coronary Calcium Score 657.  Diffuse RCA calcification.  Minimal mild disease in the LAD and diagonal branches. == Overall limited study.  Notable artifact.   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 He has disease progression after the first line of chemotherapy in December 2021.   Testosterone  deficiency    Type 2 diabetes mellitus (HCC)     ALLERGIES:  is allergic to bupropion, hydrochlorothiazide, and lisinopril .  MEDICATIONS:   Current Outpatient Medications  Medication Sig Dispense Refill   albuterol  (VENTOLIN  HFA) 108 (90 Base) MCG/ACT inhaler Inhale 2 puffs into the lungs every 4 (four) hours as needed for shortness of breath. 18 each 0   B-D 3CC LUER-LOK SYR 22GX1 22G X 1 3 ML MISC USE AS INSTRUCTED FOR TESTOSTERONE  INJECTION EVERY 2 WEEKS 10 each 2   Black Pepper-Turmeric (TURMERIC COMPLEX/BLACK PEPPER PO) Take 1 tablet by mouth in the morning and at bedtime.     blood glucose meter kit and supplies KIT Dispense based on patient and insurance preference. Use up to four times daily as directed. 1 each 0   Calcium Carb-Cholecalciferol (CALCIUM PLUS VITAMIN D3 PO) Take 1 tablet by mouth in the morning and at bedtime.     Coenzyme Q10 (COQ10) 200 MG CAPS Take 200 mg by mouth daily.     Continuous Glucose Sensor (FREESTYLE LIBRE 3 PLUS SENSOR) MISC Use to check blood sugar continuously. Change sensor every 15 days. 6 each 1   cyclobenzaprine  (FLEXERIL ) 10 MG tablet Take 1 tablet (10 mg total) by mouth 2 (two) times daily  as needed for muscle spasms. 20 tablet 0   docusate sodium  (COLACE) 100 MG capsule Take 1 capsule (100 mg total) by mouth every 12 (twelve) hours. (Patient taking differently: Take 100 mg by mouth daily as needed for moderate constipation.) 60 capsule 0   ELIQUIS  5 MG TABS tablet TAKE 1 TABLET BY MOUTH TWICE A DAY 180 tablet 2   esomeprazole (NEXIUM) 20 MG capsule Take 20 mg by mouth in the morning and at bedtime.     gabapentin  (NEURONTIN ) 300 MG capsule Take 1 capsule (300 mg total) by mouth 2 (two) times daily. For back pain. 180 capsule 1   glucose blood (ONETOUCH ULTRA) test strip USE UP TO 4 TIMES DAILY AS DIRECTED 400 strip 5   HYDROcodone  bit-homatropine (HYCODAN) 5-1.5 MG/5ML syrup Take 5 mLs by mouth every 6 (six) hours as needed for cough. 120 mL 0   insulin  aspart (NOVOLOG  FLEXPEN) 100 UNIT/ML FlexPen Inject 1-9 Units into the skin 3 (three) times daily with meals as needed for high blood  sugar (while on chemotherapy). (Patient not taking: Reported on 10/04/2024)     Insulin  Pen Needle (BD PEN NEEDLE NANO U/F) 32G X 4 MM MISC Use with insulin  as prescribed Dx Code: E11.9 100 each 3   ipratropium-albuterol  (DUONEB) 0.5-2.5 (3) MG/3ML SOLN Take 3 mLs by nebulization every 6 (six) hours as needed (SOB/Wheezing).     KRILL OIL PO Take 1 capsule by mouth daily.     Lancets MISC USE UP TO 3 TIMES DAILY AS DIRECTED 100 each 2   lidocaine  (LIDODERM ) 5 % Place 1 patch onto the skin daily. Remove & Discard patch within 12 hours or as directed by MD 30 patch 3   metFORMIN  (GLUCOPHAGE ) 1000 MG tablet TAKE 1 TABLET (1,000 MG TOTAL) BY MOUTH 2 (TWO) TIMES DAILY WITH A MEAL. FOR DIABETES 180 tablet 0   mirtazapine  (REMERON ) 15 MG tablet TAKE 1 TABLET BY MOUTH EVERYDAY AT BEDTIME 90 tablet 1   morphine  (MS CONTIN ) 30 MG 12 hr tablet Take 1 tablet (30 mg total) by mouth every 8 (eight) hours as needed. (Patient taking differently: Take 30 mg by mouth every 6 (six) hours as needed for pain.) 90 tablet 0   Multiple Vitamin (MULTI-VITAMINS) TABS Take 1 tablet by mouth daily with breakfast.     Naloxone  HCl 3 MG/0.1ML LIQD Place 1 spray into both nostrils once. For known/suspected opiod overdose. Every 2-3 minutes in alternating nostril till EMS arrives.     ondansetron  (ZOFRAN ) 8 MG tablet Take 1 tablet (8 mg total) by mouth every 8 (eight) hours as needed for nausea or vomiting. 30 tablet 3   oxyCODONE -acetaminophen  (PERCOCET) 10-325 MG tablet Take 1-1.5 tablets by mouth every 4 (four) hours as needed for pain. 90 tablet 0   pioglitazone  (ACTOS ) 45 MG tablet TAKE 1 TABLET (45 MG TOTAL) BY MOUTH DAILY. FOR DIABETES. 90 tablet 1   polyethylene glycol powder (GLYCOLAX /MIRALAX ) 17 GM/SCOOP powder Take 17 g by mouth daily as needed for moderate constipation or mild constipation.     pravastatin  (PRAVACHOL ) 40 MG tablet TAKE 1 TABLET BY MOUTH EVERY DAY IN THE EVENING FOR CHOLESTEROL 90 tablet 2   PRESCRIPTION  MEDICATION CPAP- At bedtime     testosterone  cypionate (DEPOTESTOSTERONE CYPIONATE) 200 MG/ML injection Inject 200 mg into the muscle every 14 (fourteen) days.     No current facility-administered medications for this visit.   Facility-Administered Medications Ordered in Other Visits  Medication Dose Route Frequency Provider  Last Rate Last Admin   heparin  lock flush 100 unit/mL  500 Units Intracatheter Once Sherrod Sherrod, MD       sodium chloride  flush (NS) 0.9 % injection 10 mL  10 mL Intracatheter Once Sherrod Sherrod, MD        SURGICAL HISTORY:  Past Surgical History:  Procedure Laterality Date   CHEST TUBE INSERTION Right 01/20/2022   Procedure: CHEST TUBE INSERTION;  Surgeon: Claudene Toribio BROCKS, MD;  Location: [facility] ENDOSCOPY;  Service: Pulmonary;  Laterality: Right;  w/ Talc  Pleurodesis, planned admit for Obs afterwards   CHEST TUBE INSERTION Right 04/28/2022   Procedure: INSERTION PLEURAL DRAINAGE CATHETER - Pigtail tail, drainage;  Surgeon: Claudene Toribio BROCKS, MD;  Location: [facility] ENDOSCOPY;  Service: Pulmonary;  Laterality: Right;  talc  pleurodesis   CHEST TUBE INSERTION Right 09/01/2022   Procedure: INSERTION PLEURAL DRAINAGE CATHETER;  Surgeon: Alaine Vicenta NOVAK, MD;  Location: [facility] ENDOSCOPY;  Service: Cardiopulmonary;  Laterality: Right;  pigtail catheter placement with talc  pleurodesis   COLONOSCOPY WITH PROPOFOL  N/A 12/17/2018   Procedure: COLONOSCOPY WITH PROPOFOL ;  Surgeon: Janalyn Keene NOVAK, MD;  Location: [facility] ENDOSCOPY;  Service: Gastroenterology;  Laterality: N/A;   IR IMAGING GUIDED PORT INSERTION  04/17/2020   IR IVC FILTER RETRIEVAL / S&I /IMG GUID/MOD SED  09/19/2024   IR IVC FILTER RETRIEVAL / S&I PORTER GUID/MOD SED  09/19/2024   IR THORACENTESIS ASP PLEURAL SPACE W/IMG GUIDE  11/29/2021   IR THORACENTESIS ASP PLEURAL SPACE W/IMG GUIDE  01/03/2022   JOINT REPLACEMENT Bilateral    RADIOLOGY WITH ANESTHESIA N/A 09/19/2024   Procedure: RADIOLOGY WITH ANESTHESIA;  Surgeon:  Radiologist, Medication, MD;  Location: [facility] ORS;  Service: Radiology;  Laterality: N/A;   REPLACEMENT TOTAL KNEE BILATERAL  2015   TALC  PLEURODESIS  09/01/2022   Procedure: TALC  PLEURADESIS;  Surgeon: Alaine Vicenta NOVAK, MD;  Location: [facility] ENDOSCOPY;  Service: Cardiopulmonary;;   TONSILLECTOMY  1960   TRANSTHORACIC ECHOCARDIOGRAM  05/2020   a) 05/2020: EF 55 to 60%.  No R WMA.  Mild LVH.  Indeterminate LVEDP.  Unable to assess RVP.  Normal aortic and mitral valves.  Mildly elevated RAP.; b) 09/2021: EF 50-55%. No RWMA. Mild LVH. ~ LVEDP. Mild LA Dilation. NORMAL RV/RAP.  Normal MV/AoV.    REVIEW OF SYSTEMS:  Constitutional: positive for fatigue and weight loss Eyes: negative Ears, nose, mouth, throat, and face: negative Respiratory: positive for dyspnea on exertion Cardiovascular: negative Gastrointestinal: positive for abdominal pain and nausea Genitourinary:negative Integument/breast: negative Hematologic/lymphatic: negative Musculoskeletal:positive for back pain Neurological: negative Behavioral/Psych: negative Endocrine: negative Allergic/Immunologic: negative   PHYSICAL EXAMINATION: General appearance: alert, cooperative, fatigued, and no distress Head: Normocephalic, without obvious abnormality, atraumatic Neck: no adenopathy, no JVD, supple, symmetrical, trachea midline, and thyroid  not enlarged, symmetric, no tenderness/mass/nodules Lymph nodes: Cervical, supraclavicular, and axillary nodes normal. Resp: clear to auscultation bilaterally Back: symmetric, no curvature. ROM normal. No CVA tenderness. Cardio: regular rate and rhythm, S1, S2 normal, no murmur, click, rub or gallop GI: soft, non-tender; bowel sounds normal; no masses,  no organomegaly Extremities: extremities normal, atraumatic, no cyanosis or edema Neurologic: Alert and oriented X 3, normal strength and tone. Normal symmetric reflexes. Normal coordination and gait  ECOG PERFORMANCE STATUS: 1 - Symptomatic but  completely ambulatory  Blood pressure 113/78, pulse (!) 133, temperature 97.6 F (36.4 C), temperature source Temporal, resp. rate 17, height 5' 9 (1.753 m), weight 221 lb (100.2 kg), SpO2 95%.  LABORATORY DATA: Lab Results  Component Value Date   WBC  5.9 10/17/2024   HGB 8.2 (L) 10/17/2024   HCT 26.2 (L) 10/17/2024   MCV 98.5 10/17/2024   PLT 184 10/17/2024      Chemistry      Component Value Date/Time   NA 135 10/12/2024 1059   K 3.7 10/12/2024 1059   CL 96 (L) 10/12/2024 1059   CO2 28 10/12/2024 1059   BUN 12 10/12/2024 1059   CREATININE 0.78 10/12/2024 1059   CREATININE 1.45 (H) 10/18/2021 1533      Component Value Date/Time   CALCIUM 8.1 (L) 10/12/2024 1059   ALKPHOS 166 (H) 10/12/2024 1059   AST 30 10/12/2024 1059   ALT 21 10/12/2024 1059   BILITOT 0.6 10/12/2024 1059       RADIOGRAPHIC STUDIES: CT CHEST ABDOMEN PELVIS W CONTRAST Result Date: 10/09/2024 CLINICAL DATA:  Small-cell lung cancer restaging * Tracking Code: BO * EXAM: CT CHEST, ABDOMEN, AND PELVIS WITH CONTRAST TECHNIQUE: Multidetector CT imaging of the chest, abdomen and pelvis was performed following the standard protocol during bolus administration of intravenous contrast. RADIATION DOSE REDUCTION: This exam was performed according to the departmental dose-optimization program which includes automated exposure control, adjustment of the mA and/or kV according to patient size and/or use of iterative reconstruction technique. CONTRAST:  OMNIPAQUE  IOHEXOL  300 MG/ML  SOLN COMPARISON:  08/13/2024 FINDINGS: CT CHEST FINDINGS Cardiovascular: Right chest port catheter. Aortic atherosclerosis. Normal heart size. Three-vessel coronary artery calcifications. Small pericardial effusion. Mediastinum/Nodes: Unchanged appearance of treated soft tissue about the right hilum and prominent subcentimeter pretracheal lymph nodes (series 2, image 26, 24). Thyroid  gland, trachea, and esophagus demonstrate no significant  findings. Lungs/Pleura: Slight interval increase in dense, masslike consolidation and atelectasis of the right apex, the right lung now almost completely consolidated. Additional extensive perihilar consolidation of the right middle and right lower lobes. Small, loculated right pleural effusion with associated pleural thickening and hyperdensity, in keeping with talc  pleurodesis, unchanged. Left lung is normally aerated. Musculoskeletal: No chest wall abnormality. No acute osseous findings. CT ABDOMEN PELVIS FINDINGS Hepatobiliary: No solid liver abnormality is seen. Unchanged appearance of a treated, predominantly cystic metastasis in the superior liver dome, hepatic segment VIII, measuring 5.7 x 3.4 cm (series 2, image 47). No gallstones, gallbladder wall thickening, or biliary dilatation. Pancreas: Unremarkable. No pancreatic ductal dilatation or surrounding inflammatory changes. Spleen: Normal in size without significant abnormality. Adrenals/Urinary Tract: Adrenal glands are unremarkable. Benign bilateral renal cortical cysts for which no further follow-up or characterization is required. Kidneys are otherwise normal, without renal calculi, solid lesion, or hydronephrosis. Thickening of the urinary bladder, likely secondary to chronic outlet obstruction. Stomach/Bowel: Stomach is within normal limits. Appendix appears normal. No evidence of bowel wall thickening, distention, or inflammatory changes. Vascular/Lymphatic: Aortic atherosclerosis. No significant change in porta hepatis and portacaval lymph nodes, measuring up to 3.5 x 2.3 cm (series 2, image 61). Reproductive: No mass or other abnormality. Other: No abdominal wall hernia. Right lower quadrant subcutaneous infusion pump. No ascites. Musculoskeletal: No acute osseous findings. IMPRESSION: 1. Slight interval increase in dense, masslike consolidation and atelectasis of the right apex, the right lung now almost completely consolidated. Additional  extensive perihilar consolidation of the right middle and right lower lobes. 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 2. Unchanged appearance of treated soft tissue about the right hilum and prominent subcentimeter pretracheal lymph nodes. 3. Small, loculated right pleural effusion with associated pleural thickening and hyperdensity, in keeping with talc  pleurodesis, unchanged. 4. Unchanged treated metastasis in the  liver dome. 5. No significant change in portacaval and porta hepatis lymphadenopathy. 6. Coronary artery disease. Aortic Atherosclerosis (ICD10-I70.0). Electronically Signed   By: Marolyn JONETTA Jaksch M.D.   On: 10/09/2024 09:16   MR Brain W Wo Contrast Result Date: 09/29/2024 EXAM: MRI BRAIN WITH AND WITHOUT CONTRAST 09/29/2024 06:22:48 PM TECHNIQUE: Multiplanar multisequence MRI of the head/brain was performed with and without the administration of 10mL gadobutrol  (GADAVIST ) 1 MMOL/ML injection 10 mL GADOBUTROL  1 MMOL/ML IV SOLN. COMPARISON: Comparison made with prior study from 09/06/2024. CLINICAL HISTORY: Metastatic disease evaluation; 3T SRS Protocol for radiation treatment planning. FINDINGS: BRAIN AND VENTRICLES: No acute infarct. No acute intracranial hemorrhage. Single chronic microhemorrhage noted within the peripheral left cerebellar hemisphere, stable. No mass effect or midline shift. No hydrocephalus. The sella is unremarkable. Normal flow voids. Enhancing mass involving the peripheral aspect of the inferior right cerebellum again seen lesion measures 1.1 x 0.6 x 1.0 cm (series 1100, image 128). Additional enhancing lesion involving the peripheral parafalcine left parietal cortex measures 1.0 x 0.8 x 1.1 cm (series 11, image 245). Associated diffusion and FLAIR signal abnormality again noted. No significant surrounding edema or regional mass effect. Overall, changes are concerning for metastatic disease, and are not significantly changed as compared  to prior brain MRI from 09/06/2024. No new lesions identified. Note made of a small linear focus of enhancement involving the mid right cerebellar hemisphere (series 1100, image 150) felt to be consistent with a vascular structure. This is better seen on prior MRI. ORBITS: No acute abnormality. SINUSES: No acute abnormality. BONES AND SOFT TISSUES: Normal bone marrow signal and enhancement. No acute soft tissue abnormality. IMPRESSION: 1. Enhancing lesions involving the inferior right cerebellum and left parietal cortex as detailed above, concerning for metastatic disease. No significant surrounding edema or regional mass effect. 2. Overall, appearance is stable as compared to prior MRI from 09/06/24. No new lesions. Electronically signed by: Morene Hoard MD 09/29/2024 09:42 PM EDT RP Workstation: [id number]   IR INTRATHECAL CATHETER PLACEMENT Addendum Date: 09/27/2024 ADDENDUM REPORT: 09/27/2024 08:52 ADDENDUM: Template error in original report, CLINICAL DATA should read: history significant of metastatic lung cancer, T2DM, HTN, PE on Eliquis , GERD, HLD, COPD and chronic back pain. Pt has had multiple hospitalizations due to pain exacerbations. He is on multiple narcotic PO pain medications and a 5% lidocaine  patch in attempt to control his back pain. Previous epidural steroid injections have provided minimal relief of pain. Pt continues to have uncontrolled back pain. Electronically Signed   By: JONETTA Faes M.D.   On: 09/27/2024 08:52   Result Date: 09/27/2024 CLINICAL DATA:  GI bleed. EXAM: IR INTRATHECAL PUMP PLACEMENT ANESTHESIA/SEDATION: MAC per department of anesthesia MEDICATIONS: Lidocaine  1% subcutaneous CONTRAST:  None required PROCEDURE: Informed consent was obtained from the patient prior to the procedure, including potential complications of headache,allergy,and pain. Port pocket was discussed and marked on the right lower abdomen with the patient sitting. Patient placed left lateral  decubitus and padded. A time-out was performed. Lumbar and right lower quadrant anterior abdominal surgical areas prepped with betadine, draped in usualsterile fashion. Under fluoroscopy, an appropriate skin entry site was determined for lumbar puncture. Region was infiltrated locally with 1% lidocaine . 16 gauge Touhy needle advanced into the thecal space under fluoroscopy at L2-3 from right parasagital approach. CSFspontaneously returned through the needle. Intrathecal catheter was advanced to the T9 level until resistance encountered. Small vertical incision was created below the needle. A 4-0-silk pursestring suture was placed deep subcutaneous around the needle  which was then withdrawn and the suture secured to prevent CSF leak. The anchor device was then advanced over the catheter and secured in the deep subcutaneous tissues with two 4- 0 silk sutures. Attention was then turned to the port pocket. A 7 cm Transverse incision was made at the superior margin of the plannedport pocket. Subcutaneous pocket was created with a combination of blunt and sharp dissection. Hemostasis was achieved. Test fit of thepump was performed. A tunneling device was used to create a tunnel from the lumbar incision to the pump pocket incision. CatheterPlacement details were recorded, and the catheter was passed through The tunneling device and secured. The catheter was then connected tothe implanted pump. The pump was Secured with 0 silk retention sutures at the 4 corners. Test Gentle aspiration returned CSF, confirming patency of the tubing. Wounds were closed in layers using deep and subcuticular 3-0 Monocryl, Dermabond applied, and dressings applied. The patienttolerated the procedure well. IMPRESSION:Technically successful placement of intrathecal catheter andimplanted pump for pain control COMPLICATIONS: None immediate FINDINGS: Intrathecal catheter advanced to the T9 level.Technically successful tunneled catheter with implanted  pump placement right lower quadrant. IMPRESSION: Technically successful placement of intrathecal catheter and implanted pump for pain control. Electronically Signed: By: JONETTA Faes M.D. On: 09/27/2024 08:49   IR INTRATHECAL PUMP PLACEMENT Result Date: 09/19/2024 CLINICAL DATA:  GI bleed. EXAM: IR INTRATHECAL PUMP PLACEMENT ANESTHESIA/SEDATION: MAC per department of anesthesia MEDICATIONS: Lidocaine  1% subcutaneous CONTRAST:  None required PROCEDURE: Informed consent was obtained from the patient prior to the procedure, including potential complications of headache,allergy,and pain. Port pocket was discussed and marked on the right lower abdomen with the patient sitting. Patient placed left lateral decubitus and padded. A time-out was performed. Lumbar and right lower quadrant anterior abdominal surgical areas prepped with betadine, draped in usualsterile fashion. Under fluoroscopy, an appropriate skin entry site was determined for lumbar puncture. Region was infiltrated locally with 1% lidocaine . 16 gauge Touhy needle advanced into the thecal space under fluoroscopy at L2-3 from right parasagital approach. CSFspontaneously returned through the needle. Intrathecal catheter was advanced to the T9 level until resistance encountered. Small vertical incision was created below the needle. A 4-0-silk pursestring suture was placed deep subcutaneous around the needle which was then withdrawn and the suture secured to prevent CSF leak. The anchor device was then advanced over the catheter and secured in the deep subcutaneous tissues with two 4- 0 silk sutures. Attention was then turned to the port pocket. A 7 cm Transverse incision was made at the superior margin of the plannedport pocket. Subcutaneous pocket was created with a combination of blunt and sharp dissection. Hemostasis was achieved. Test fit of thepump was performed. A tunneling device was used to create a tunnel from the lumbar incision to the pump pocket  incision. CatheterPlacement details were recorded, and the catheter was passed through The tunneling device and secured. The catheter was then connected tothe implanted pump. The pump was Secured with 0 silk retention sutures at the 4 corners. Test Gentle aspiration returned CSF, confirming patency of the tubing. Wounds were closed in layers using deep and subcuticular 3-0 Monocryl, Dermabond applied, and dressings applied. The patienttolerated the procedure well. IMPRESSION:Technically successful placement of intrathecal catheter andimplanted pump for pain control. COMPLICATIONS: None immediate FINDINGS: Intrathecal catheter advanced to the T9 level.Technically successful tunneled catheter with implanted pump placement right lower quadrant. IMPRESSION: Technically successful placement of intrathecal catheter and implanted pump for pain control. Electronically Signed   By: JONETTA Faes  M.D.   On: 09/19/2024 14:56    ASSESSMENT AND PLAN: This is a very pleasant 73 year old Caucasian male diagnosed with extensive stage (T4, N3, M1C) small cell lung cancer presented with large right upper lobe lung mass in addition to mediastinal and right supraclavicular lymphadenopathy and multiple metastatic liver lesions diagnosed in April 2021. The patient initially underwent systemic chemotherapy with carboplatin  for an AUC of 5 on day 1, etoposide  100 mg per metered squared on days 1, 2, and 3 in addition to Cosela  for myeloprotection.  He also received immunotherapy with Imfinzi  on day 1 of every chemotherapy cycle.  He is status post 9 cycles.  Starting from cycle #5 he was on single agent immunotherapy with Imfinzi  IV every 4 weeks.  The patient had evidence of disease progression on his scan from December 2021.  He was then started on systemic chemotherapy with carboplatin  for an AUC of 5 on day 1, etoposide  100 mg per metered squared on days 1, 2, and 3 in addition to Tecentriq  1200 mg on day 1, the patient is status  post 9 cycles.  Starting from cycle #5, the patient started maintenance immunotherapy with Tecentriq .  This was discontinued due to evidence of disease progression. The patient started third line treatment with Zepzelca  (lurbinectedin ) 3.2 Mg/M2 every 3-week status post 12 cycles of this treatment and he has been tolerating it fairly well except for fatigue.  This treatment was discontinued secondary to disease progression. He underwent fourth line treatment with systemic chemotherapy with irinotecan  65 Mg/M2 on days 1 and 8 every 3 weeks.  Status post 3 cycles.  He tolerated this treatment well with no concerning adverse effect except for mild fatigue. Unfortunately his scan showed evidence for disease progression especially in the liver. I recommended for the patient to discontinue his current treatment with irinotecan  at this point. He started systemic chemotherapy again with carboplatin  for AUC of 5 on day 1, etoposide  100 Mg/M2 on days 1, 2 and 3 with Cosela  status post 15 cycles. I did reduce the dose of carboplatin  to AUC of 4 and etoposide  80 Mg/M2 starting from cycle #5.  He has been tolerating this treatment fairly well with no concerning complaints except for the increasing fatigue and weakness as well as the chemotherapy-induced pancytopenia. He had repeat CT scan of the chest, abdomen and pelvis performed recently.  I personally and independently reviewed the scan images and discussed the result and showed the images to the patient and his wife.  His scan showed redemonstration of the posttreatment/postradiation appearance of the right chest with dense fibrosis and consolidation of the perihilar right lung as well as a small loculated appearing right pleural effusion with associated pleural densities suggesting prior talc  pleurodesis.  There was significantly increased areas of masslike consolidation in the right lung for example in the peripheral right upper lobe and superior segment of the right  lower lobe with extensive new associated nodularity concerning for local malignant recurrence. The patient is also more symptomatic with more dry cough and shortness of breath. I recommended for him to hold any additional treatment with chemotherapy for now. The patient was seen by Dr. Boyd at [facility] and treated with tarlatamab which is a by specific T-cell engager (BiTE) for the last 8 months.  Unfortunately he had evidence for disease progression after this treatment and it was discontinued. He started systemic chemotherapy with Topotecan  1.2 mg/M2 on days 1-5 every 3 weeks with Cosela  240 Mg/M2 IV  before the chemotherapy.  First dose January 04, 2024.  Status post 8 cycles.  This treatment was discontinued secondary to disease progression and the patient started palliative systemic chemotherapy again with reduced dose carboplatin  and Etoposide  with Cosela  before the chemotherapy status post 3 cycles.  He has been tolerating his treatment well except for the fatigue and pancytopenia. Assessment and Plan Assessment & Plan Metastatic extensive stage small cell lung cancer on systemic chemotherapy Currently on reduced dose carboplatin  and etoposide . Last scan showed no progression. He is feeling better than last week but still weak. He is agreeable to continuing treatment. - Continue systemic chemotherapy with reduced dose carboplatin  and etoposide  - Administered fluids to improve hydration and energy levels  Elevated liver enzymes of unclear etiology Bilirubin elevated to 4.5, alkaline phosphatase 572, AST 123, ALT 136. Recent scan showed no liver progression. Differential includes possible obstruction or gallbladder issues. - Ordered ultrasound of the liver to assess for obstruction or gallbladder issues - Administered fluids to potentially improve liver enzyme levels - Will repeat liver enzyme tests on Wednesday  Generalized weakness Reports weakness, particularly in legs, causing  shaking when standing. Fluids have improved his condition. - Administered fluids to improve energy levels  Chronic pain managed with pain pump Pain pump recently adjusted to provide medication every four hours, which has improved his condition. Further adjustments may be needed. - Continue monitoring and adjusting pain pump settings as needed  Nocturnal cough managed with cough syrup Cough syrup has been effective in helping him sleep. No new medications added recently. - Continue current cough syrup regimen I will see him back for follow-up visit next week for evaluation and management of his condition. He was advised to call immediately if he has any other concerning symptoms in the interval. The patient voices understanding of current disease status and treatment options and is in agreement with the current care plan.  All questions were answered. The patient knows to call the clinic with any problems, questions or concerns. We can certainly see the patient much sooner if necessary. The total time spent in the appointment was 30 minutes including review of chart and various tests results, discussions about plan of care and coordination of care plan .   Disclaimer: This note was dictated with voice recognition software. Similar sounding words can inadvertently be transcribed and may not be corrected upon review.

## 2024-10-17 NOTE — Patient Instructions (Signed)
 Fluids Given Through an IV (IV Infusion Therapy): What to Expect IV infusion therapy is a treatment to deliver a fluid, called an infusion, into a vein. You may have IV infusion to get: Fluids. Medicines. Nutrition. Chemotherapy. This is medicines to stop or slow down cancer cells. Blood or blood products. Dye that is given before an MRI or a CT scan. This is called contrast dye. Tell a health care provider about: Any allergies you have. This includes allergies to anesthesia or dyes. All medicines you take. These include vitamins, herbs, eye drops, and creams. Any bleeding problems you have. Any surgeries you've had, including if you've had lymph nodes taken out of your armpit or if you have a arteriovenous fistula for dialysis. Any medical problems you have. Whether you're pregnant or may be pregnant. Whether you've used IV drugs. What are the risks? Your health care provider will talk with you about risks. These may include: Pain, bruising, or bleeding. Infection. The IV leaking or moving out of place. Damage to blood vessels or nerves. Allergic reactions to medicines or dyes. A blood clot. An air bubble in the vein, also called an air embolism. What happens before the procedure? Eat and drink only as you've been told. Ask about changing or stopping: Any medicines you take. Any vitamins, herbs, or supplements you take. What happens during the procedure?     Placing the catheter Your skin at the IV site will be washed with fluid that kills germs. This will help prevent infection. IV infusion therapy starts with a procedure to place a soft tube called a catheter into a vein. An IV tube will be attached to the catheter to let the infusion flow into your blood. Your catheter may be placed: Into a vein that is usually in the bend of the elbow, forearm, or back of the hand. This is called a peripheral IV catheter. This may need to be put into a vein each time you get an  infusion. Into a vein near your elbow. This is called a midline catheter or a peripherally inserted central catheter (PICC). These types of catheters may stay in place for weeks or months at a time so you can receive repeated infusions through it. Into a vein near your neck that leads to your heart. This is called a non-tunneled catheter. This is only used for short amounts of time because it can cause infection. Through the skin of your chest and into a large vein that leads to your heart. This is called a tunneled catheter. This may stay in your body for months or years. Into an implanted port. An implanted port is a device that is surgically inserted under the skin of the chest to provide long-term IV access. The catheter will connect the port to a large vein in the chest or upper arm. A port may be kept in place for many months or years. Each time you have an infusion, a needle will be inserted through your skin to connect the catheter to the port. Doing the infusion To start the infusion, your provider will: Attach the IV tubing to your catheter. Use a tape or a bandage to hold the IV in place against your skin. An IV pump may be used to control the flow of the IV infusion. During the infusion, your provider will check the area to make sure: There is no bleeding, swelling, or pain. Your IV infusion is flowing correctly. After the infusion, your provider will: Take off the bandage  or tape. Disconnect the tubing from the catheter. Remove the catheter, if you have a peripheral IV. Apply pressure over the IV insertion site to stop bleeding, then cover the area with a bandage. If you have an implanted port, PICC, non-tunneled, or tunneled catheter, your catheter may remain in place. This depends on how many times you will need treatment, your medical condition, and what type of catheter you have. These steps may vary. Ask what you can expect. What can I expect after the procedure? You may be  watched closely until you leave. This includes checking your pain level, blood pressure, heart rate, and breathing rate. Your provider will check to make sure there are no signs of infection. Follow these instructions at home: Take your medicines only as told. Change or take off your bandage as told by your provider. Ask what things are safe for you to do at home. Ask when you can go back to work or school. Do not take baths, swim, or use a hot tub until you're told it's OK. Ask if you can shower. Check your IV insertion site every day for signs of infection. Check for: Redness, swelling, or pain. Fluid or blood. If fluid or blood drains from your IV site, use your hands to press down firmly on the area for a minute or two. Doing this should stop the bleeding. Warmth. Pus or a bad smell. Contact a health care provider if: You have signs of infection around your IV site. You have fluid or blood coming from your IV site that does not stop after you put pressure to the site. You have a rash or blisters. You have itchy, red, swollen areas of skin called hives. Get help right away if: You have a fever or chills. You have chest pain. You have trouble breathing. This information is not intended to replace advice given to you by your health care provider. Make sure you discuss any questions you have with your health care provider. Document Revised: 05/26/2023 Document Reviewed: 05/26/2023 Elsevier Patient Education  2024 [employer].

## 2024-10-18 ENCOUNTER — Other Ambulatory Visit: Payer: Self-pay

## 2024-10-18 ENCOUNTER — Telehealth: Payer: Self-pay

## 2024-10-18 DIAGNOSIS — R319 Hematuria, unspecified: Secondary | ICD-10-CM

## 2024-10-18 NOTE — Telephone Encounter (Signed)
 Faxed new patient referral to [facility] urology @ [place] with confirmation @ [phone].

## 2024-10-19 ENCOUNTER — Inpatient Hospital Stay (HOSPITAL_COMMUNITY)
Admission: EM | Admit: 2024-10-19 | Discharge: 2024-10-21 | DRG: 441 | Disposition: A | Discharge status: Hospice | Source: Ambulatory Visit | Attending: Internal Medicine | Admitting: Internal Medicine

## 2024-10-19 ENCOUNTER — Emergency Department (HOSPITAL_COMMUNITY)

## 2024-10-19 ENCOUNTER — Encounter (HOSPITAL_COMMUNITY): Payer: Self-pay

## 2024-10-19 ENCOUNTER — Ambulatory Visit (HOSPITAL_COMMUNITY)
Admission: RE | Admit: 2024-10-19 | Discharge: 2024-10-19 | Disposition: A | Discharge status: Home / Self Care | Source: Ambulatory Visit | Attending: Internal Medicine | Admitting: Internal Medicine

## 2024-10-19 ENCOUNTER — Inpatient Hospital Stay

## 2024-10-19 ENCOUNTER — Other Ambulatory Visit: Payer: Self-pay | Admitting: Nurse Practitioner

## 2024-10-19 ENCOUNTER — Other Ambulatory Visit: Payer: Self-pay

## 2024-10-19 DIAGNOSIS — Z7901 Long term (current) use of anticoagulants: Secondary | ICD-10-CM

## 2024-10-19 DIAGNOSIS — N281 Cyst of kidney, acquired: Secondary | ICD-10-CM | POA: Diagnosis not present

## 2024-10-19 DIAGNOSIS — F32A Depression, unspecified: Secondary | ICD-10-CM | POA: Diagnosis present

## 2024-10-19 DIAGNOSIS — R059 Cough, unspecified: Secondary | ICD-10-CM | POA: Diagnosis not present

## 2024-10-19 DIAGNOSIS — D649 Anemia, unspecified: Secondary | ICD-10-CM | POA: Diagnosis present

## 2024-10-19 DIAGNOSIS — K7460 Unspecified cirrhosis of liver: Secondary | ICD-10-CM | POA: Diagnosis not present

## 2024-10-19 DIAGNOSIS — D630 Anemia in neoplastic disease: Secondary | ICD-10-CM | POA: Diagnosis present

## 2024-10-19 DIAGNOSIS — Z85118 Personal history of other malignant neoplasm of bronchus and lung: Secondary | ICD-10-CM

## 2024-10-19 DIAGNOSIS — C3411 Malignant neoplasm of upper lobe, right bronchus or lung: Secondary | ICD-10-CM

## 2024-10-19 DIAGNOSIS — Z86711 Personal history of pulmonary embolism: Secondary | ICD-10-CM

## 2024-10-19 DIAGNOSIS — Z79891 Long term (current) use of opiate analgesic: Secondary | ICD-10-CM

## 2024-10-19 DIAGNOSIS — Z794 Long term (current) use of insulin: Secondary | ICD-10-CM

## 2024-10-19 DIAGNOSIS — Z7984 Long term (current) use of oral hypoglycemic drugs: Secondary | ICD-10-CM

## 2024-10-19 DIAGNOSIS — Z888 Allergy status to other drugs, medicaments and biological substances status: Secondary | ICD-10-CM

## 2024-10-19 DIAGNOSIS — Z8249 Family history of ischemic heart disease and other diseases of the circulatory system: Secondary | ICD-10-CM

## 2024-10-19 DIAGNOSIS — Z96653 Presence of artificial knee joint, bilateral: Secondary | ICD-10-CM | POA: Diagnosis present

## 2024-10-19 DIAGNOSIS — Z86718 Personal history of other venous thrombosis and embolism: Secondary | ICD-10-CM

## 2024-10-19 DIAGNOSIS — Z79899 Other long term (current) drug therapy: Secondary | ICD-10-CM

## 2024-10-19 DIAGNOSIS — E7849 Other hyperlipidemia: Secondary | ICD-10-CM | POA: Diagnosis present

## 2024-10-19 DIAGNOSIS — R7401 Elevation of levels of liver transaminase levels: Secondary | ICD-10-CM | POA: Diagnosis present

## 2024-10-19 DIAGNOSIS — G893 Neoplasm related pain (acute) (chronic): Secondary | ICD-10-CM | POA: Diagnosis present

## 2024-10-19 DIAGNOSIS — R5381 Other malaise: Secondary | ICD-10-CM | POA: Diagnosis present

## 2024-10-19 DIAGNOSIS — Z515 Encounter for palliative care: Secondary | ICD-10-CM

## 2024-10-19 DIAGNOSIS — Z825 Family history of asthma and other chronic lower respiratory diseases: Secondary | ICD-10-CM

## 2024-10-19 DIAGNOSIS — E1169 Type 2 diabetes mellitus with other specified complication: Secondary | ICD-10-CM | POA: Diagnosis present

## 2024-10-19 DIAGNOSIS — R319 Hematuria, unspecified: Secondary | ICD-10-CM | POA: Diagnosis present

## 2024-10-19 DIAGNOSIS — R17 Unspecified jaundice: Principal | ICD-10-CM

## 2024-10-19 DIAGNOSIS — Z8261 Family history of arthritis: Secondary | ICD-10-CM

## 2024-10-19 DIAGNOSIS — Z87891 Personal history of nicotine dependence: Secondary | ICD-10-CM

## 2024-10-19 DIAGNOSIS — I10 Essential (primary) hypertension: Secondary | ICD-10-CM | POA: Diagnosis present

## 2024-10-19 DIAGNOSIS — Z833 Family history of diabetes mellitus: Secondary | ICD-10-CM

## 2024-10-19 DIAGNOSIS — J449 Chronic obstructive pulmonary disease, unspecified: Secondary | ICD-10-CM | POA: Diagnosis present

## 2024-10-19 DIAGNOSIS — M545 Low back pain, unspecified: Secondary | ICD-10-CM | POA: Diagnosis present

## 2024-10-19 DIAGNOSIS — C781 Secondary malignant neoplasm of mediastinum: Secondary | ICD-10-CM | POA: Diagnosis present

## 2024-10-19 DIAGNOSIS — K219 Gastro-esophageal reflux disease without esophagitis: Secondary | ICD-10-CM | POA: Diagnosis present

## 2024-10-19 DIAGNOSIS — G4733 Obstructive sleep apnea (adult) (pediatric): Secondary | ICD-10-CM | POA: Diagnosis present

## 2024-10-19 DIAGNOSIS — E119 Type 2 diabetes mellitus without complications: Secondary | ICD-10-CM

## 2024-10-19 DIAGNOSIS — K769 Liver disease, unspecified: Secondary | ICD-10-CM | POA: Diagnosis not present

## 2024-10-19 DIAGNOSIS — Z9221 Personal history of antineoplastic chemotherapy: Secondary | ICD-10-CM

## 2024-10-19 DIAGNOSIS — R918 Other nonspecific abnormal finding of lung field: Secondary | ICD-10-CM | POA: Diagnosis not present

## 2024-10-19 DIAGNOSIS — C787 Secondary malignant neoplasm of liver and intrahepatic bile duct: Secondary | ICD-10-CM | POA: Diagnosis present

## 2024-10-19 DIAGNOSIS — R932 Abnormal findings on diagnostic imaging of liver and biliary tract: Secondary | ICD-10-CM | POA: Diagnosis not present

## 2024-10-19 DIAGNOSIS — I2510 Atherosclerotic heart disease of native coronary artery without angina pectoris: Secondary | ICD-10-CM | POA: Diagnosis present

## 2024-10-19 DIAGNOSIS — W010XXA Fall on same level from slipping, tripping and stumbling without subsequent striking against object, initial encounter: Secondary | ICD-10-CM | POA: Diagnosis present

## 2024-10-19 DIAGNOSIS — F419 Anxiety disorder, unspecified: Secondary | ICD-10-CM | POA: Diagnosis present

## 2024-10-19 LAB — CBC WITH DIFFERENTIAL/PLATELET
Abs Immature Granulocytes: 0.07 K/uL (ref 0.00–0.07)
Basophils Absolute: 0 K/uL (ref 0.0–0.1)
Basophils Relative: 0 %
Eosinophils Absolute: 0 K/uL (ref 0.0–0.5)
Eosinophils Relative: 0 %
HCT: 23.9 % — ABNORMAL LOW (ref 39.0–52.0)
Hemoglobin: 7.2 g/dL — ABNORMAL LOW (ref 13.0–17.0)
Immature Granulocytes: 1 %
Lymphocytes Relative: 4 %
Lymphs Abs: 0.2 K/uL — ABNORMAL LOW (ref 0.7–4.0)
MCH: 30.6 pg (ref 26.0–34.0)
MCHC: 30.1 g/dL (ref 30.0–36.0)
MCV: 101.7 fL — ABNORMAL HIGH (ref 80.0–100.0)
Monocytes Absolute: 0.5 K/uL (ref 0.1–1.0)
Monocytes Relative: 9 %
Neutro Abs: 4.4 K/uL (ref 1.7–7.7)
Neutrophils Relative %: 86 %
Platelets: 127 K/uL — ABNORMAL LOW (ref 150–400)
RBC: 2.35 MIL/uL — ABNORMAL LOW (ref 4.22–5.81)
RDW: 20.6 % — ABNORMAL HIGH (ref 11.5–15.5)
WBC: 5.2 K/uL (ref 4.0–10.5)
nRBC: 0.4 % — ABNORMAL HIGH (ref 0.0–0.2)

## 2024-10-19 LAB — FERRITIN: Ferritin: 5541 ng/mL — ABNORMAL HIGH (ref 24–336)

## 2024-10-19 LAB — URINALYSIS, W/ REFLEX TO CULTURE (INFECTION SUSPECTED)
Bacteria, UA: NONE SEEN
Glucose, UA: 50 mg/dL — AB
Hgb urine dipstick: NEGATIVE
Ketones, ur: 5 mg/dL — AB
Leukocytes,Ua: NEGATIVE
Nitrite: NEGATIVE
Protein, ur: 30 mg/dL — AB
Specific Gravity, Urine: 1.03 (ref 1.005–1.030)
pH: 5 (ref 5.0–8.0)

## 2024-10-19 LAB — CMP (CANCER CENTER ONLY)
ALT: 110 U/L — ABNORMAL HIGH (ref 0–44)
AST: 110 U/L — ABNORMAL HIGH (ref 15–41)
Albumin: 2.9 g/dL — ABNORMAL LOW (ref 3.5–5.0)
Alkaline Phosphatase: 604 U/L — ABNORMAL HIGH (ref 38–126)
Anion gap: 10 (ref 5–15)
BUN: 12 mg/dL (ref 8–23)
CO2: 30 mmol/L (ref 22–32)
Calcium: 8.5 mg/dL — ABNORMAL LOW (ref 8.9–10.3)
Chloride: 95 mmol/L — ABNORMAL LOW (ref 98–111)
Creatinine: 0.73 mg/dL (ref 0.61–1.24)
GFR, Estimated: >60 mL/min (ref >60)
Glucose, Bld: 178 mg/dL — ABNORMAL HIGH (ref 70–99)
Potassium: 3.6 mmol/L (ref 3.5–5.1)
Sodium: 135 mmol/L (ref 135–145)
Total Bilirubin: 5.9 mg/dL (ref 0.0–1.2)
Total Protein: 5.7 g/dL — ABNORMAL LOW (ref 6.5–8.1)

## 2024-10-19 LAB — FOLATE: Folate: >20 ng/mL (ref >5.9)

## 2024-10-19 LAB — BILIRUBIN, DIRECT: Bilirubin, Direct: 4.4 mg/dL — ABNORMAL HIGH (ref 0.0–0.2)

## 2024-10-19 LAB — PROTIME-INR
INR: 1.6 — ABNORMAL HIGH (ref 0.8–1.2)
Prothrombin Time: 20.1 s — ABNORMAL HIGH (ref 11.4–15.2)

## 2024-10-19 LAB — IRON AND TIBC
Iron: 79 ug/dL (ref 45–182)
Saturation Ratios: 46 % — ABNORMAL HIGH (ref 17.9–39.5)
TIBC: 174 ug/dL — ABNORMAL LOW (ref 250–450)
UIBC: 94 ug/dL

## 2024-10-19 LAB — RETICULOCYTES
Immature Retic Fract: 33.1 % — ABNORMAL HIGH (ref 2.3–15.9)
RBC.: 2.77 MIL/uL — ABNORMAL LOW (ref 4.22–5.81)
Retic Count, Absolute: 126 K/uL (ref 19.0–186.0)
Retic Ct Pct: 4.6 % — ABNORMAL HIGH (ref 0.4–3.1)

## 2024-10-19 LAB — PREPARE RBC (CROSSMATCH)

## 2024-10-19 LAB — DIRECT ANTIGLOBULIN TEST (NOT AT ARMC)
DAT, IgG: NEGATIVE
DAT, complement: NEGATIVE

## 2024-10-19 LAB — LACTATE DEHYDROGENASE: LDH: 335 U/L — ABNORMAL HIGH (ref 98–192)

## 2024-10-19 LAB — VITAMIN B12: Vitamin B-12: 1125 pg/mL — ABNORMAL HIGH (ref 180–914)

## 2024-10-19 MED ORDER — DOCUSATE SODIUM 100 MG PO CAPS
100.0000 mg | ORAL_CAPSULE | Freq: Every day | ORAL | Status: DC | PRN
  Administered 2024-10-20: 100 mg via ORAL
  Filled 2024-10-19: qty 1

## 2024-10-19 MED ORDER — SODIUM CHLORIDE 0.9 % IV BOLUS
1000.0000 mL | Freq: Once | INTRAVENOUS | Status: AC
  Administered 2024-10-19: 1000 mL via INTRAVENOUS

## 2024-10-19 MED ORDER — MORPHINE SULFATE ER 30 MG PO TBCR
30.0000 mg | EXTENDED_RELEASE_TABLET | Freq: Three times a day (TID) | ORAL | Status: DC | PRN
  Administered 2024-10-19 – 2024-10-20 (×2): 30 mg via ORAL
  Filled 2024-10-19 (×2): qty 1

## 2024-10-19 MED ORDER — PANTOPRAZOLE SODIUM 40 MG PO TBEC
40.0000 mg | DELAYED_RELEASE_TABLET | Freq: Every day | ORAL | Status: DC
  Administered 2024-10-20 – 2024-10-21 (×2): 40 mg via ORAL
  Filled 2024-10-19 (×2): qty 1

## 2024-10-19 MED ORDER — ENSURE PLUS HIGH PROTEIN PO LIQD
237.0000 mL | Freq: Two times a day (BID) | ORAL | Status: DC
  Administered 2024-10-21 (×2): 237 mL via ORAL

## 2024-10-19 MED ORDER — CHLORHEXIDINE GLUCONATE CLOTH 2 % EX PADS
6.0000 | MEDICATED_PAD | Freq: Every day | CUTANEOUS | Status: DC
  Administered 2024-10-20 – 2024-10-21 (×3): 6 via TOPICAL

## 2024-10-19 MED ORDER — METFORMIN HCL 500 MG PO TABS: 1000.0000 mg | ORAL_TABLET | Freq: Two times a day (BID) | ORAL | Status: DC

## 2024-10-19 MED ORDER — ONDANSETRON HCL 4 MG/2ML IJ SOLN: 4.0000 mg | Freq: Four times a day (QID) | INTRAMUSCULAR | Status: DC | PRN

## 2024-10-19 MED ORDER — ALBUTEROL SULFATE (2.5 MG/3ML) 0.083% IN NEBU
3.0000 mL | INHALATION_SOLUTION | RESPIRATORY_TRACT | Status: DC | PRN
Start: 2024-10-19 — End: 2024-10-21

## 2024-10-19 MED ORDER — SODIUM CHLORIDE 0.9 % IV SOLN: Freq: Once | INTRAVENOUS | Status: AC

## 2024-10-19 MED ORDER — HYDROCODONE BIT-HOMATROP MBR 5-1.5 MG/5ML PO SOLN: 5.0000 mL | Freq: Four times a day (QID) | ORAL | 0 refills | Status: DC | PRN

## 2024-10-19 MED ORDER — PIOGLITAZONE HCL 15 MG PO TABS
45.0000 mg | ORAL_TABLET | Freq: Every day | ORAL | Status: DC
  Administered 2024-10-20 – 2024-10-21 (×2): 45 mg via ORAL
  Filled 2024-10-19 (×2): qty 3

## 2024-10-19 MED ORDER — ONDANSETRON HCL 4 MG PO TABS: 4.0000 mg | ORAL_TABLET | Freq: Four times a day (QID) | ORAL | Status: DC | PRN

## 2024-10-19 MED ORDER — ADULT MULTIVITAMIN W/MINERALS CH
1.0000 | ORAL_TABLET | Freq: Every day | ORAL | Status: DC
  Administered 2024-10-20 – 2024-10-21 (×2): 1 via ORAL
  Filled 2024-10-19 (×2): qty 1

## 2024-10-19 MED ORDER — CYCLOBENZAPRINE HCL 10 MG PO TABS
10.0000 mg | ORAL_TABLET | Freq: Two times a day (BID) | ORAL | Status: DC | PRN
Start: 2024-10-19 — End: 2024-10-21
  Administered 2024-10-20: 10 mg via ORAL
  Filled 2024-10-19: qty 1

## 2024-10-19 MED ORDER — APIXABAN 5 MG PO TABS
5.0000 mg | ORAL_TABLET | Freq: Two times a day (BID) | ORAL | Status: DC
  Administered 2024-10-19 – 2024-10-21 (×4): 5 mg via ORAL
  Filled 2024-10-19 (×4): qty 1

## 2024-10-19 MED ORDER — SODIUM CHLORIDE 0.9 % IV SOLN: INTRAVENOUS | Status: AC

## 2024-10-19 MED ORDER — MIRTAZAPINE 15 MG PO TABS
15.0000 mg | ORAL_TABLET | Freq: Every day | ORAL | Status: DC
  Administered 2024-10-19 – 2024-10-20 (×2): 15 mg via ORAL
  Filled 2024-10-19 (×2): qty 1

## 2024-10-19 MED ORDER — SODIUM CHLORIDE 0.9 % IV SOLN: INTRAVENOUS | Status: DC

## 2024-10-19 MED ORDER — SODIUM CHLORIDE 0.9% FLUSH
10.0000 mL | Freq: Two times a day (BID) | INTRAVENOUS | Status: DC
  Administered 2024-10-19 – 2024-10-21 (×4): 10 mL

## 2024-10-19 MED ORDER — SODIUM CHLORIDE 0.9% IV SOLUTION: Freq: Once | INTRAVENOUS | Status: DC

## 2024-10-19 MED ORDER — INSULIN ASPART 100 UNIT/ML IJ SOLN: 0.0000 [IU] | Freq: Every day | INTRAMUSCULAR | Status: DC

## 2024-10-19 MED ORDER — GABAPENTIN 300 MG PO CAPS
300.0000 mg | ORAL_CAPSULE | Freq: Two times a day (BID) | ORAL | Status: DC
  Administered 2024-10-19 – 2024-10-21 (×4): 300 mg via ORAL
  Filled 2024-10-19 (×4): qty 1

## 2024-10-19 MED ORDER — INSULIN ASPART 100 UNIT/ML IJ SOLN
0.0000 [IU] | Freq: Three times a day (TID) | INTRAMUSCULAR | Status: DC
  Administered 2024-10-20 (×3): 1 [IU] via SUBCUTANEOUS
  Administered 2024-10-21: 2 [IU] via SUBCUTANEOUS
  Administered 2024-10-21: 1 [IU] via SUBCUTANEOUS
  Filled 2024-10-19: qty 2
  Filled 2024-10-19 (×3): qty 1

## 2024-10-19 MED ORDER — IOHEXOL 300 MG/ML  SOLN
100.0000 mL | Freq: Once | INTRAMUSCULAR | Status: AC | PRN
  Administered 2024-10-19: 100 mL via INTRAVENOUS

## 2024-10-19 MED ORDER — POLYETHYLENE GLYCOL 3350 17 G PO PACK
17.0000 g | PACK | Freq: Every day | ORAL | Status: DC
  Administered 2024-10-20 – 2024-10-21 (×2): 17 g via ORAL
  Filled 2024-10-19 (×2): qty 1

## 2024-10-19 NOTE — Progress Notes (Signed)
 RN stated the need to obtain blood sugar. Pt refused.

## 2024-10-19 NOTE — Progress Notes (Signed)
 CRITICAL VALUE STICKER  CRITICAL VALUE: Tbili 5.9  RECEIVER (on-site recipient of call): Danetta POUR, RN  DATE & TIME NOTIFIED: 10/19/24 1215p  MESSENGER (representative from lab): Aldona  MD NOTIFIED: Charlott DEL., PA and Dr Sherrod  TIME OF NOTIFICATION: 10/19/24 at 1215p  RESPONSE:  Advised pt go to ED for further evaluation. Report called to charge RN in ED, advised to bring pt to room 8. Dawna, RN in infusion aware and will get pt over to ED

## 2024-10-19 NOTE — ED Notes (Signed)
 Had to re access pt's port due to the port needle not being compatible with CT imaging. Pt's port now accessed with a power port needle kit. Blood work sent.

## 2024-10-19 NOTE — H&P (Signed)
 History and Physical    Praneel Haisley FMW:[id number] DOB: Jan 18, 1951 DOA: 10/19/2024  PCP: [name], NP  Patient coming from: Home  I have personally briefly reviewed patient's old medical records available.   Chief Complaint: Abnormal labs, elevated bilirubin and low hemoglobin.  HPI: Adam Wise is a 73 y.o. male with medical history significant of metastatic lung cancer currently on chemotherapy, last chemotherapy 10/31 with carboplatin  and etoposide , metastatic to liver and mediastinum, chronic back pain on morphine  pump, sleep apnea, COPD, hypertension and hyperlipidemia presented to the emergency room at suggestion of his cancer doctor with worsening bilirubin.  Patient himself has noted orange-colored urine since at least 5 days now.  He has a lot of chronic issues including chronic fatigue, low appetite, dyspnea on exertion, back pain and recently being managed with morphine  pump and with long-acting morphine  tablets on top of that. Patient went to cancer center for blood work 11/3, found to have bilirubin of 4.5, AST and ALT 123/136 respectively.  He was asked to recheck levels today and found to have bilirubin of 5.9 and AST and ALT 110/110 respectively.  He was sent to the ER for investigations.  ED Course: On my interview, patient denies any complaints.  Complaints are mostly chronic.  Hemodynamically stable.  Bilirubin as above.  Hemoglobin 7.2.  He has chronic anemia.  Albumin 2.9. Chest x-ray essentially normal. CT scan of the abdomen pelvis with known metastatic lesion in the liver, normal biliary tree. Right upper quadrant ultrasound with normal biliary tree. [facility] was consulted from ER.  Currently does not recommend MRI.  Review of Systems: all systems are reviewed and pertinent positive as per HPI otherwise rest are negative.    Past Medical History:  Diagnosis Date   Acute on chronic respiratory failure with hypoxia (HCC) 10/12/2021   Anxiety    Arthritis     Chickenpox    Chronic knee pain    Chronic low back pain    COPD exacerbation (HCC) 10/11/2021   COPD with exacerbation (HCC) 10/12/2021   Coronary artery calcification seen on CAT scan 11/2021   Coronary CTA 11/27/2021: Moderate to large right pleural effusion and compressive atelectasis right lung base. => Coronary Calcium Score 657.  Diffuse RCA calcification.  Minimal mild disease in the LAD and diagonal branches. == Overall limited study.  Notable artifact.   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 He has disease progression after the first line of chemotherapy in December 2021.   Testosterone  deficiency    Type 2 diabetes mellitus (HCC)     Past Surgical History:  Procedure Laterality Date   CHEST TUBE INSERTION Right 01/20/2022   Procedure: CHEST TUBE INSERTION;  Surgeon: Claudene Toribio BROCKS, MD;  Location: [facility] ENDOSCOPY;  Service: Pulmonary;  Laterality: Right;  w/ Talc  Pleurodesis, planned admit for Obs afterwards   CHEST TUBE INSERTION Right 04/28/2022   Procedure: INSERTION PLEURAL DRAINAGE CATHETER - Pigtail tail, drainage;  Surgeon: Claudene Toribio BROCKS, MD;  Location: [facility] ENDOSCOPY;  Service:  Pulmonary;  Laterality: Right;  talc  pleurodesis   CHEST TUBE INSERTION Right 09/01/2022   Procedure: INSERTION PLEURAL DRAINAGE CATHETER;  Surgeon: Alaine Vicenta NOVAK, MD;  Location: [facility] ENDOSCOPY;  Service: Cardiopulmonary;  Laterality: Right;  pigtail catheter placement with talc  pleurodesis   COLONOSCOPY  WITH PROPOFOL  N/A 12/17/2018   Procedure: COLONOSCOPY WITH PROPOFOL ;  Surgeon: Janalyn Keene NOVAK, MD;  Location: [facility] ENDOSCOPY;  Service: Gastroenterology;  Laterality: N/A;   IR IMAGING GUIDED PORT INSERTION  04/17/2020   IR IVC FILTER RETRIEVAL / S&I /IMG GUID/MOD SED  09/19/2024   IR IVC FILTER RETRIEVAL / S&I PORTER GUID/MOD SED  09/19/2024   IR THORACENTESIS ASP PLEURAL SPACE W/IMG GUIDE  11/29/2021   IR THORACENTESIS ASP PLEURAL SPACE W/IMG GUIDE  01/03/2022   JOINT REPLACEMENT Bilateral    RADIOLOGY WITH ANESTHESIA N/A 09/19/2024   Procedure: RADIOLOGY WITH ANESTHESIA;  Surgeon: Radiologist, Medication, MD;  Location: [facility] ORS;  Service: Radiology;  Laterality: N/A;   REPLACEMENT TOTAL KNEE BILATERAL  2015   TALC  PLEURODESIS  09/01/2022   Procedure: TALC  PLEURADESIS;  Surgeon: Alaine Vicenta NOVAK, MD;  Location: [facility] ENDOSCOPY;  Service: Cardiopulmonary;;   TONSILLECTOMY  1960   TRANSTHORACIC ECHOCARDIOGRAM  05/2020   a) 05/2020: EF 55 to 60%.  No R WMA.  Mild LVH.  Indeterminate LVEDP.  Unable to assess RVP.  Normal aortic and mitral valves.  Mildly elevated RAP.; b) 09/2021: EF 50-55%. No RWMA. Mild LVH. ~ LVEDP. Mild LA Dilation. NORMAL RV/RAP.  Normal MV/AoV.    Social history   reports that he has quit smoking. He has never been exposed to tobacco smoke. He has never used smokeless tobacco. He reports that he does not currently use alcohol. He reports that he does not currently use drugs.  Allergies  Allergen Reactions   Bupropion Other (See Comments) and Dermatitis    Racing heart  palpitations  Racing heart  palpitations Racing heart palpitations Racing heart    palpitations    Racing heart    palpitations Racing heart palpitations Racing heart    palpitations, Racing heart, palpitations, Racing heart   Hydrochlorothiazide Other (See Comments)    Cramping to lower extremities   Lisinopril      Cough     Family History  Problem Relation Age of Onset   Heart attack  Mother    Cancer Mother    Hypertension Mother    Arthritis Father    Asthma Father    Cancer Father    COPD Father    Heart attack Father    Hypertension Sister    Cancer Sister    Diabetes Sister    Asthma Sister    Cancer Sister    COPD Sister    Arthritis Sister    Asthma Sister    Diabetes Sister    Birth defects Maternal Grandfather    Arthritis Paternal Grandmother    Diabetes Paternal Grandmother    Arthritis Paternal Grandfather    Asthma Son    Other Neg Hx        pituitary disorder     Prior to Admission medications   Medication Sig Start Date End Date Taking? Authorizing Provider  albuterol  (VENTOLIN  HFA) 108 (90 Base) MCG/ACT inhaler Inhale 2 puffs into the lungs every 4 (four) hours as needed for shortness of breath. 05/30/24   [name], NP  B-D 3CC LUER-LOK SYR 22GX1 22G X 1 3 ML MISC USE AS INSTRUCTED FOR TESTOSTERONE  INJECTION EVERY 2 WEEKS 03/20/21  [name], NP  Black Pepper-Turmeric (TURMERIC COMPLEX/BLACK PEPPER PO) Take 1 tablet by mouth in the morning and at bedtime.    [provider]  blood glucose meter kit and supplies KIT Dispense based on patient and insurance preference. Use up to four times daily as directed. 08/25/24   Sherrod Sherrod, MD  Calcium Carb-Cholecalciferol (CALCIUM PLUS VITAMIN D3 PO) Take 1 tablet by mouth in the morning and at bedtime.    [provider]  Coenzyme Q10 (COQ10) 200 MG CAPS Take 200 mg by mouth daily.    [provider]  Continuous Glucose Sensor (FREESTYLE LIBRE 3 PLUS SENSOR) MISC Use to check blood sugar continuously. Change sensor every 15 days. 09/23/24   Clark, Katherine K, NP  cyclobenzaprine  (FLEXERIL ) 10 MG tablet Take 1 tablet (10 mg total) by mouth 2 (two) times daily as needed for muscle spasms. 07/29/24   Curatolo, Adam, DO  docusate sodium  (COLACE) 100 MG capsule Take 1 capsule (100 mg total) by mouth every 12 (twelve) hours. Patient taking differently: Take  100 mg by mouth daily as needed for moderate constipation. 08/02/23   Mannie Pac T, DO  ELIQUIS  5 MG TABS tablet TAKE 1 TABLET BY MOUTH TWICE A DAY 10/11/24   Heilingoetter, Cassandra L, PA-C  esomeprazole (NEXIUM) 20 MG capsule Take 20 mg by mouth in the morning and at bedtime.    [provider]  gabapentin  (NEURONTIN ) 300 MG capsule Take 1 capsule (300 mg total) by mouth 2 (two) times daily. For back pain. 08/10/24   Pickenpack-Cousar, Fannie SAILOR, NP  glucose blood (ONETOUCH ULTRA) test strip USE UP TO 4 TIMES DAILY AS DIRECTED 07/29/22   Clark, Katherine K, NP  HYDROcodone  bit-homatropine (HYCODAN) 5-1.5 MG/5ML syrup Take 5 mLs by mouth every 6 (six) hours as needed for cough. 10/19/24   Pickenpack-Cousar, Athena N, NP  insulin  aspart (NOVOLOG  FLEXPEN) 100 UNIT/ML FlexPen Inject 1-9 Units into the skin 3 (three) times daily with meals as needed for high blood sugar (while on chemotherapy). Patient not taking: Reported on 10/04/2024    [provider]  Insulin  Pen Needle (BD PEN NEEDLE NANO U/F) 32G X 4 MM MISC Use with insulin  as prescribed Dx Code: E11.9 05/20/23   [name], NP  ipratropium-albuterol  (DUONEB) 0.5-2.5 (3) MG/3ML SOLN Take 3 mLs by nebulization every 6 (six) hours as needed (SOB/Wheezing).    [provider]  KRILL OIL PO Take 1 capsule by mouth daily.    [provider]  Lancets MISC USE UP TO 3 TIMES DAILY AS DIRECTED 05/06/18   Clark, Katherine K, NP  lidocaine  (LIDODERM ) 5 % Place 1 patch onto the skin daily. Remove & Discard patch within 12 hours or as directed by MD 09/06/24   Pickenpack-Cousar, Fannie SAILOR, NP  metFORMIN  (GLUCOPHAGE ) 1000 MG tablet TAKE 1 TABLET (1,000 MG TOTAL) BY MOUTH 2 (TWO) TIMES DAILY WITH A MEAL. FOR DIABETES 10/11/24   [name], NP  mirtazapine  (REMERON ) 15 MG tablet TAKE 1 TABLET BY MOUTH EVERYDAY AT BEDTIME 02/28/24   Heilingoetter, Cassandra L, PA-C  morphine  (MS CONTIN ) 30 MG 12 hr tablet Take 1  tablet (30 mg total) by mouth every 8 (eight) hours as needed. Patient taking differently: Take 30 mg by mouth every 6 (six) hours as needed for pain. 09/09/24   Pickenpack-Cousar, Athena N, NP  Multiple Vitamin (MULTI-VITAMINS) TABS Take 1 tablet by mouth daily with breakfast.    [provider]  Naloxone  HCl  3 MG/0.1ML LIQD Place 1 spray into both nostrils once. For known/suspected opiod overdose. Every 2-3 minutes in alternating nostril till EMS arrives. 10/06/23   [provider]  ondansetron  (ZOFRAN ) 8 MG tablet Take 1 tablet (8 mg total) by mouth every 8 (eight) hours as needed for nausea or vomiting. 07/11/24   Heilingoetter, Cassandra L, PA-C  oxyCODONE -acetaminophen  (PERCOCET) 10-325 MG tablet Take 1-1.5 tablets by mouth every 4 (four) hours as needed for pain. 09/06/24   Pickenpack-Cousar, Athena N, NP  pioglitazone  (ACTOS ) 45 MG tablet TAKE 1 TABLET (45 MG TOTAL) BY MOUTH DAILY. FOR DIABETES. 08/17/24   Clark, Katherine K, NP  polyethylene glycol powder (GLYCOLAX /MIRALAX ) 17 GM/SCOOP powder Take 17 g by mouth daily as needed for moderate constipation or mild constipation.    [provider]  pravastatin  (PRAVACHOL ) 40 MG tablet TAKE 1 TABLET BY MOUTH EVERY DAY IN THE EVENING FOR CHOLESTEROL 08/17/24   Clark, Katherine K, NP  PRESCRIPTION MEDICATION CPAP- At bedtime    [provider]  testosterone  cypionate (DEPOTESTOSTERONE CYPIONATE) 200 MG/ML injection Inject 200 mg into the muscle every 14 (fourteen) days.    [provider]    Physical Exam: Vitals:   10/19/24 1316 10/19/24 1706  BP: 112/76 124/81  Pulse: (!) 110 (!) 108  Resp:  16  Temp: 97.8 F (36.6 C) 97.8 F (36.6 C)  TempSrc: Oral Oral  SpO2: 93% 90%    Constitutional: NAD, calm, comfortable.  Icteric. Vitals:   10/19/24 1316 10/19/24 1706  BP: 112/76 124/81  Pulse: (!) 110 (!) 108  Resp:  16  Temp: 97.8 F (36.6 C) 97.8 F (36.6 C)  TempSrc: Oral Oral  SpO2: 93% 90%    Eyes: PERRL, lids and conjunctivae normal ENMT: Mucous membranes are moist. Posterior pharynx clear of any exudate or lesions.Normal dentition.  Neck: normal, supple, no masses, no thyromegaly Respiratory: clear to auscultation bilaterally, no wheezing, no crackles. Normal respiratory effort. No accessory muscle use.  Patient has a Port-A-Cath on the right chest wall. Cardiovascular: Regular rate and rhythm, no murmurs / rubs / gallops. No extremity edema. 2+ pedal pulses. No carotid bruits.  Abdomen: no tenderness, no masses palpated. No hepatosplenomegaly. Bowel sounds positive.  Patient has a pain pump on the right abdominal wall nontender. Musculoskeletal: no clubbing / cyanosis. No joint deformity upper and lower extremities. Good ROM, no contractures. Normal muscle tone.  Skin: no rashes, lesions, ulcers. No induration Neurologic: CN 2-12 grossly intact. Sensation intact, DTR normal. Strength 5/5 in all 4.  Grossly weak. Psychiatric: Normal judgment and insight. Alert and oriented x 3. Normal mood.     Labs on Admission: I have personally reviewed following labs and imaging studies  CBC: Recent Labs  Lab 10/17/24 1025 10/19/24 1514  WBC 5.9 5.2  NEUTROABS 4.6 4.4  HGB 8.2* 7.2*  HCT 26.2* 23.9*  MCV 98.5 101.7*  PLT 184 127*   Basic Metabolic Panel: Recent Labs  Lab 10/17/24 1025 10/19/24 1043  NA 136 135  K 3.7 3.6  CL 97* 95*  CO2 30 30  GLUCOSE 179* 178*  BUN 12 12  CREATININE 0.81 0.73  CALCIUM 8.1* 8.5*   GFR: Estimated Creatinine Clearance: 94.5 mL/min (by C-G formula based on SCr of 0.73 mg/dL). Liver Function Tests: Recent Labs  Lab 10/17/24 1025 10/19/24 1043  AST 123* 110*  ALT 136* 110*  ALKPHOS 572* 604*  BILITOT 4.5* 5.9*  PROT 5.7* 5.7*  ALBUMIN 2.9* 2.9*   No results for  input(s): LIPASE, AMYLASE in the last 168 hours. No results for input(s): AMMONIA in the last 168 hours. Coagulation Profile: Recent Labs  Lab 10/19/24 1514   INR 1.6*   Cardiac Enzymes: No results for input(s): CKTOTAL, CKMB, CKMBINDEX, TROPONINI in the last 168 hours. BNP (last 3 results) No results for input(s): PROBNP in the last 8760 hours. HbA1C: No results for input(s): HGBA1C in the last 72 hours. CBG: No results for input(s): GLUCAP in the last 168 hours. Lipid Profile: No results for input(s): CHOL, HDL, LDLCALC, TRIG, CHOLHDL, LDLDIRECT in the last 72 hours. Thyroid  Function Tests: No results for input(s): TSH, T4TOTAL, FREET4, T3FREE, THYROIDAB in the last 72 hours. Anemia Panel: No results for input(s): VITAMINB12, FOLATE, FERRITIN, TIBC, IRON, RETICCTPCT in the last 72 hours. Urine analysis:    Component Value Date/Time   COLORURINE AMBER (A) 10/19/2024 1440   APPEARANCEUR CLEAR 10/19/2024 1440   LABSPEC 1.030 10/19/2024 1440   PHURINE 5.0 10/19/2024 1440   GLUCOSEU 50 (A) 10/19/2024 1440   GLUCOSEU 100 (A) 11/25/2017 1359   HGBUR NEGATIVE 10/19/2024 1440   BILIRUBINUR MODERATE (A) 10/19/2024 1440   BILIRUBINUR neg 01/01/2024 1358   KETONESUR 5 (A) 10/19/2024 1440   PROTEINUR 30 (A) 10/19/2024 1440   UROBILINOGEN 1.0 01/01/2024 1358   UROBILINOGEN 0.2 11/25/2017 1359   NITRITE NEGATIVE 10/19/2024 1440   LEUKOCYTESUR NEGATIVE 10/19/2024 1440    Radiological Exams on Admission: CT ABDOMEN PELVIS W CONTRAST Result Date: 10/19/2024 CLINICAL DATA:  Abnormal LFTs. Hematuria. History small cell lung cancer. EXAM: CT ABDOMEN AND PELVIS WITH CONTRAST TECHNIQUE: Multidetector CT imaging of the abdomen and pelvis was performed using the standard protocol following bolus administration of intravenous contrast. RADIATION DOSE REDUCTION: This exam was performed according to the departmental dose-optimization program which includes automated exposure control, adjustment of the mA and/or kV according to patient size and/or use of iterative reconstruction technique. CONTRAST:   OMNIPAQUE  IOHEXOL  300 MG/ML  SOLN COMPARISON:  10/07/2024 and 08/13/2024, 02/12/2023 FINDINGS: Lower chest: Mild stable cardiomegaly with stable small pericardial effusion. Central venous catheter with tip over the right atrium unchanged. Calcified plaque over the descending thoracic aorta. Visualized lung bases demonstrate a tiny amount left pleural fluid. Tiny amount right pleural fluid with associated scarring/basilar atelectasis unchanged. Mild high density material along the right lower thoracic pleural likely from previous pleurodesis unchanged. Hepatobiliary: Stable 5.4 cm low-density mass over the dome of the liver presumed metastatic focus. Few other smaller liver hypodensities unchanged and likely due to metastatic disease. Surgical clip over the right lobe adjacent the gallbladder fossa. Gallbladder is normal. Mild prominence of the central intrahepatic ducts unchanged. Pancreas: Unchanged. Spleen: Normal. Adrenals/Urinary Tract: Left adrenal gland is normal. Stable 1.4 cm right adrenal nodule possibly small adenoma versus metastatic focus. Kidneys are normal in size without hydronephrosis. Bilateral renal cysts unchanged. Ureters and bladder are normal. Stomach/Bowel: Stomach and small bowel are normal. Appendix is normal. Mild diverticulosis of the colon which is otherwise unremarkable. Vascular/Lymphatic: Mild calcified plaque over the abdominal aorta which is normal in caliber. Remaining vascular structures are unremarkable. Stable moderate celiac axis and portacaval adenopathy. This adenopathy causes moderate narrowing of the hepatic artery as well as mild focal narrowing of the portal vein. Stable adenopathy the just inferior to the SMA as well as right periaortic region. Reproductive: Prostate is unremarkable. Other: No significant free fluid in the abdomen and no focal inflammatory change. Small amount of free pelvic fluid which is new. Metallic pain pump over the  anterior right lower abdominal  wall unchanged. Musculoskeletal: Degenerative change of the spine and hips. No evidence of osseous metastatic disease. IMPRESSION: 1. No acute findings in the abdomen/pelvis. 2. Stable 5.4 cm low-density mass over the dome of the liver presumed metastatic focus. Few other smaller liver hypodensities unchanged and likely due to metastatic disease. 3. Stable moderate celiac axis and portacaval adenopathy. This adenopathy causes moderate narrowing of the hepatic artery as well as mild focal narrowing of the portal vein. Stable adenopathy just inferior to the SMA as well as right periaortic region. 4. Stable 1.4 cm right adrenal nodule possibly small adenoma versus metastatic focus. 5. Stable bilateral renal cysts. 6. Mild diverticulosis of the colon. 7. Stable small pericardial effusion. 8. Aortic atherosclerosis. Aortic Atherosclerosis (ICD10-I70.0). Electronically Signed   By: Toribio Agreste M.D.   On: 10/19/2024 16:00   DG Chest 2 View Result Date: 10/19/2024 EXAM: 2 VIEW(S) XRAY OF THE CHEST 10/19/2024 03:17:36 PM COMPARISON: Previous exams are available. CLINICAL HISTORY: cough x 1 week FINDINGS: LINES, TUBES AND DEVICES: Right-sided port-a-cath is unchanged. LUNGS AND PLEURA: Right upper lobe opacity is noted consistent with radiation fibrosis and possible right hilar mass or neoplasm. No pulmonary edema. No pleural effusion. No pneumothorax. HEART AND MEDIASTINUM: Possible right hilar mass or neoplasm. No acute abnormality of the cardiac silhouette. BONES AND SOFT TISSUES: No acute osseous abnormality. IMPRESSION: 1. 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 2. Possible right hilar mass or neoplasm. 3. Unchanged right-sided port-a-cath. Electronically signed by: Lynwood Seip MD 10/19/2024 03:22 PM EST RP Workstation: [id number]   US  Abdomen Complete Result Date: 10/19/2024 EXAM: COMPLETE ABDOMINAL ULTRASOUND TECHNIQUE: Real-time ultrasonography of the abdomen was performed. COMPARISON: CT  10/07/2024 CLINICAL HISTORY: Elevated liver enzymes, rule out obstructive cause. FINDINGS: LIVER: The liver has a nodule or contour. Small amount of fluid along the margin of the liver. Hypoechoic lesion in the dome of the liver measuring 6.5 cm compares to lesion described on comparison CT for no change. The liver has a cirrhotic morphology. No intrahepatic biliary ductal dilatation. BILIARY SYSTEM: Gallbladder wall thickness measures 3.4 mm. No pericholecystic fluid or wall thickening. No cholelithiasis. The common bile duct measures 6.3 mm and is at the upper limits of normal in diameter. No gallbladder distention. No gallbladder inflammation. KIDNEYS: Right kidney measures 12.3 cm in length. Left kidney measures 11.6 cm in length. Normal contour of kidneys. Normal cortical echogenicity. 2 anechoic large cysts of the right kidney measuring 6 cm and 5 cm, not changed from CT. Small cyst in the left kidney. No hydronephrosis. No calculus. No mass. PANCREAS: Visualized portions of the pancreas are unremarkable. SPLEEN: Spleen length measures 13.4 cm. No acute abnormality. VESSELS: Visualized portion of the aorta is normal. Visualized portion of the inferior vena cava is normal. OTHER: No ascites. IMPRESSION: 1. No evidence of biliary duct dilatation. Common bile duct at the upper limits of normal. No cholecystitis. 2. Portal vein  patent 3. Mass lesion of the liver dome, unchanged from prior CT, previously described as treated metastasis. 4. Small amount of fluid along the margin of the liver. Cirrhotic liver morphology. 5. Benign renal cysts. No hydronephrosis. Electronically signed by: Norleen Boxer MD 10/19/2024 11:42 AM EST RP Workstation: [id number]    EKG: Independently reviewed.  Previous EKG with low voltage.  No acute changes.  Assessment/Plan Principal Problem:   Bilirubinemia Active Problems:   Essential hypertension   Type 2 diabetes mellitus (HCC)   GERD (gastroesophageal reflux disease)  OSA (obstructive sleep apnea)   Hyperlipidemia associated with type 2 diabetes mellitus (HCC)   Small cell lung cancer, right upper lobe (HCC)   Anemia     1.  Hyperbilirubinemia: Likely cholestatic jaundice.  There is no evidence of biliary obstruction.  Cancer infiltration into the liver.  Secondary to chemotherapy?  Any other etiology.  Rule out hemolysis. Check anemia panel including reticulocytes, LDH, DAT, direct bilirubin. Gentle IV fluids overnight.  Recheck levels tomorrow morning.  If persistently elevated, may need liver biopsy. Statin is discontinued.  2.  Symptomatic anemia: Hemoglobin 7.2.  Anemia panel pending.  Patient symptomatic with fatigue and weakness.  This is likely multifactorial.  1 unit PRBC transfusion today after labs are drawn.  3.  Essential hypertension: Blood pressure is stable.  Resume home medications.  4.  Type 2 diabetes: On metformin  and Jardiance, continue.  Keep on sliding scale insulin .  5.  Sleep apnea: Uses CPAP at night.  6.  Metastatic lung cancer on chemotherapy: Hold chemotherapy until liver function normalizes.  7.  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  8.   chronic back pain and cancer related pain: Patient has morphine  pump that is set with basal rate.  On top of that he takes MS SR.  30 mg every 8 hours for breakthrough pain.  Managed by palliative care at cancer center.  DVT prophylaxis: Eliquis  Code Status: Full code Family Communication: Wife at the bedside Disposition Plan: Home when improved Consults called: GI, [facility] Admission status: Observation.  Every day will reassess need for hospitalization and admission.   Renato Applebaum MD [facility]

## 2024-10-19 NOTE — ED Triage Notes (Signed)
 Pt coming from the cancer center with pain in the middle of the back radiating upwards, has been having blood in the urine for a week, not clots seen. On blood thinners, eliquis . Had blood work done at the cancer center today, states they sent him here. A&Ox 4

## 2024-10-19 NOTE — ED Provider Notes (Signed)
 [place] EMERGENCY DEPARTMENT AT [facility] Provider Note   CSN: [id number] Arrival date & time: 10/19/24  1300     Patient presents with: Hematuria and cancer pt    Adam Wise is a 73 y.o. male.   HPI 73 year old male with a history of lung cancer currently being treated presents with hematuria and abnormal LFTs.  He is on Eliquis  and has been having hematuria for the past week or so.  Some dysuria.  He denies any abdominal pain or new shortness of breath.  He has had a cough for about a week.  No fevers.  He had blood work 2 days ago that showed abnormal LFTs and he was told to stop his cholesterol meds but then on recheck today before he was supposed to get his treatment he had repeat LFTs and they were worse and so he was sent to the ER.  He did have an abdominal ultrasound today.  Prior to Admission medications   Medication Sig Start Date End Date Taking? Authorizing Provider  albuterol  (VENTOLIN  HFA) 108 (90 Base) MCG/ACT inhaler Inhale 2 puffs into the lungs every 4 (four) hours as needed for shortness of breath. 05/30/24   [name], NP  B-D 3CC LUER-LOK SYR 22GX1 22G X 1 3 ML MISC USE AS INSTRUCTED FOR TESTOSTERONE  INJECTION EVERY 2 WEEKS 03/20/21   Clark, Katherine K, NP  Black Pepper-Turmeric (TURMERIC COMPLEX/BLACK PEPPER PO) Take 1 tablet by mouth in the morning and at bedtime.    [provider]  blood glucose meter kit and supplies KIT Dispense based on patient and insurance preference. Use up to four times daily as directed. 08/25/24   Sherrod Sherrod, MD  Calcium Carb-Cholecalciferol (CALCIUM PLUS VITAMIN D3 PO) Take 1 tablet by mouth in the morning and at bedtime.    [provider]  Coenzyme Q10 (COQ10) 200 MG CAPS Take 200 mg by mouth daily.    [provider]  Continuous Glucose Sensor (FREESTYLE LIBRE 3 PLUS SENSOR) MISC Use to check blood sugar continuously. Change sensor every 15 days. 09/23/24   Clark, Katherine K, NP   cyclobenzaprine  (FLEXERIL ) 10 MG tablet Take 1 tablet (10 mg total) by mouth 2 (two) times daily as needed for muscle spasms. 07/29/24   Curatolo, Adam, DO  docusate sodium  (COLACE) 100 MG capsule Take 1 capsule (100 mg total) by mouth every 12 (twelve) hours. Patient taking differently: Take 100 mg by mouth daily as needed for moderate constipation. 08/02/23   Mannie Pac T, DO  ELIQUIS  5 MG TABS tablet TAKE 1 TABLET BY MOUTH TWICE A DAY 10/11/24   Heilingoetter, Cassandra L, PA-C  esomeprazole (NEXIUM) 20 MG capsule Take 20 mg by mouth in the morning and at bedtime.    [provider]  gabapentin  (NEURONTIN ) 300 MG capsule Take 1 capsule (300 mg total) by mouth 2 (two) times daily. For back pain. 08/10/24   Pickenpack-Cousar, Fannie SAILOR, NP  glucose blood (ONETOUCH ULTRA) test strip USE UP TO 4 TIMES DAILY AS DIRECTED 07/29/22   Clark, Katherine K, NP  HYDROcodone  bit-homatropine (HYCODAN) 5-1.5 MG/5ML syrup Take 5 mLs by mouth every 6 (six) hours as needed for cough. 10/19/24   Pickenpack-Cousar, Athena N, NP  insulin  aspart (NOVOLOG  FLEXPEN) 100 UNIT/ML FlexPen Inject 1-9 Units into the skin 3 (three) times daily with meals as needed for high blood sugar (while on chemotherapy). Patient not taking: Reported on 10/04/2024    [provider]  Insulin  Pen Needle (  BD PEN NEEDLE NANO U/F) 32G X 4 MM MISC Use with insulin  as prescribed Dx Code: E11.9 05/20/23   Clark, Katherine K, NP  ipratropium-albuterol  (DUONEB) 0.5-2.5 (3) MG/3ML SOLN Take 3 mLs by nebulization every 6 (six) hours as needed (SOB/Wheezing).    [provider]  KRILL OIL PO Take 1 capsule by mouth daily.    [provider]  Lancets MISC USE UP TO 3 TIMES DAILY AS DIRECTED 05/06/18   Clark, Katherine K, NP  lidocaine  (LIDODERM ) 5 % Place 1 patch onto the skin daily. Remove & Discard patch within 12 hours or as directed by MD 09/06/24   Pickenpack-Cousar, Athena N, NP  metFORMIN  (GLUCOPHAGE ) 1000 MG tablet  TAKE 1 TABLET (1,000 MG TOTAL) BY MOUTH 2 (TWO) TIMES DAILY WITH A MEAL. FOR DIABETES 10/11/24   [name], NP  mirtazapine  (REMERON ) 15 MG tablet TAKE 1 TABLET BY MOUTH EVERYDAY AT BEDTIME 02/28/24   Heilingoetter, Cassandra L, PA-C  morphine  (MS CONTIN ) 30 MG 12 hr tablet Take 1 tablet (30 mg total) by mouth every 8 (eight) hours as needed. Patient taking differently: Take 30 mg by mouth every 6 (six) hours as needed for pain. 09/09/24   Pickenpack-Cousar, Athena N, NP  Multiple Vitamin (MULTI-VITAMINS) TABS Take 1 tablet by mouth daily with breakfast.    [provider]  Naloxone  HCl 3 MG/0.1ML LIQD Place 1 spray into both nostrils once. For known/suspected opiod overdose. Every 2-3 minutes in alternating nostril till EMS arrives. 10/06/23   [provider]  ondansetron  (ZOFRAN ) 8 MG tablet Take 1 tablet (8 mg total) by mouth every 8 (eight) hours as needed for nausea or vomiting. 07/11/24   Heilingoetter, Cassandra L, PA-C  oxyCODONE -acetaminophen  (PERCOCET) 10-325 MG tablet Take 1-1.5 tablets by mouth every 4 (four) hours as needed for pain. 09/06/24   Pickenpack-Cousar, Athena N, NP  pioglitazone  (ACTOS ) 45 MG tablet TAKE 1 TABLET (45 MG TOTAL) BY MOUTH DAILY. FOR DIABETES. 08/17/24   Clark, Katherine K, NP  polyethylene glycol powder (GLYCOLAX /MIRALAX ) 17 GM/SCOOP powder Take 17 g by mouth daily as needed for moderate constipation or mild constipation.    [provider]  pravastatin  (PRAVACHOL ) 40 MG tablet TAKE 1 TABLET BY MOUTH EVERY DAY IN THE EVENING FOR CHOLESTEROL 08/17/24   Clark, Katherine K, NP  PRESCRIPTION MEDICATION CPAP- At bedtime    [provider]  testosterone  cypionate (DEPOTESTOSTERONE CYPIONATE) 200 MG/ML injection Inject 200 mg into the muscle every 14 (fourteen) days.    [provider]    Allergies: Bupropion, Hydrochlorothiazide, and Lisinopril     Review of Systems  Constitutional:  Negative for fever.  Respiratory:   Positive for cough. Negative for shortness of breath.   Gastrointestinal:  Negative for abdominal pain.  Genitourinary:  Positive for dysuria and hematuria.  Skin:  Negative for color change.    Updated Vital Signs BP 112/76 (BP Location: Left Arm)   Pulse (!) 110   Temp 97.8 F (36.6 C) (Oral)   SpO2 93%   Physical Exam Vitals and nursing note reviewed.  Constitutional:      Appearance: He is well-developed. He is not ill-appearing or diaphoretic.  HENT:     Head: Normocephalic and atraumatic.  Eyes:     General: Scleral icterus present.     Pupils: Pupils are equal, round, and reactive to light.  Cardiovascular:     Rate and Rhythm: Regular rhythm. Tachycardia present.     Heart sounds: Normal heart sounds.  Pulmonary:     Effort: Pulmonary effort is normal.     Breath sounds: Normal breath sounds. No wheezing.  Abdominal:     General: There is no distension.     Palpations: Abdomen is soft.     Tenderness: There is no abdominal tenderness.  Skin:    General: Skin is warm and dry.  Neurological:     Mental Status: He is alert.     (all labs ordered are listed, but only abnormal results are displayed) Labs Reviewed  URINALYSIS, W/ REFLEX TO CULTURE (INFECTION SUSPECTED) - Abnormal; Notable for the following components:      Result Value   Color, Urine AMBER (*)    Glucose, UA 50 (*)    Bilirubin Urine MODERATE (*)    Ketones, ur 5 (*)    Protein, ur 30 (*)    All other components within normal limits  CBC WITH DIFFERENTIAL/PLATELET - Abnormal; Notable for the following components:   RBC 2.35 (*)    Hemoglobin 7.2 (*)    HCT 23.9 (*)    MCV 101.7 (*)    RDW 20.6 (*)    Platelets 127 (*)    nRBC 0.4 (*)    Lymphs Abs 0.2 (*)    All other components within normal limits  PROTIME-INR - Abnormal; Notable for the following components:   Prothrombin Time 20.1 (*)    INR 1.6 (*)    All other components within normal limits  LACTATE DEHYDROGENASE  VITAMIN B12   FOLATE  IRON AND TIBC  FERRITIN  RETICULOCYTES  BILIRUBIN, DIRECT  DIRECT ANTIGLOBULIN TEST (NOT AT [facility])    EKG: None  Radiology: CT ABDOMEN PELVIS W CONTRAST Result Date: 10/19/2024 CLINICAL DATA:  Abnormal LFTs. Hematuria. History small cell lung cancer. EXAM: CT ABDOMEN AND PELVIS WITH CONTRAST TECHNIQUE: Multidetector CT imaging of the abdomen and pelvis was performed using the standard protocol following bolus administration of intravenous contrast. RADIATION DOSE REDUCTION: This exam was performed according to the departmental dose-optimization program which includes automated exposure control, adjustment of the mA and/or kV according to patient size and/or use of iterative reconstruction technique. CONTRAST:  OMNIPAQUE  IOHEXOL  300 MG/ML  SOLN COMPARISON:  10/07/2024 and 08/13/2024, 02/12/2023 FINDINGS: Lower chest: Mild stable cardiomegaly with stable small pericardial effusion. Central venous catheter with tip over the right atrium unchanged. Calcified plaque over the descending thoracic aorta. Visualized lung bases demonstrate a tiny amount left pleural fluid. Tiny amount right pleural fluid with associated scarring/basilar atelectasis unchanged. Mild high density material along the right lower thoracic pleural likely from previous pleurodesis unchanged. Hepatobiliary: Stable 5.4 cm low-density mass over the dome of the liver presumed metastatic focus. Few other smaller liver hypodensities unchanged and likely due to metastatic disease. Surgical clip over the right lobe adjacent the gallbladder fossa. Gallbladder is normal. Mild prominence of the central intrahepatic ducts unchanged. Pancreas: Unchanged. Spleen: Normal. Adrenals/Urinary Tract: Left adrenal gland is normal. Stable 1.4 cm right adrenal nodule possibly small adenoma versus metastatic focus. Kidneys are normal in size without hydronephrosis. Bilateral renal cysts unchanged. Ureters and bladder are normal. Stomach/Bowel:  Stomach and small bowel are normal. Appendix is normal. Mild diverticulosis of the colon which is otherwise unremarkable. Vascular/Lymphatic: Mild calcified plaque over the abdominal aorta which is normal in caliber. Remaining vascular structures are unremarkable. Stable moderate celiac axis and portacaval adenopathy. This adenopathy causes moderate narrowing of the hepatic artery as well as mild focal narrowing of the portal vein. Stable adenopathy the just  inferior to the SMA as well as right periaortic region. Reproductive: Prostate is unremarkable. Other: No significant free fluid in the abdomen and no focal inflammatory change. Small amount of free pelvic fluid which is new. Metallic pain pump over the anterior right lower abdominal wall unchanged. Musculoskeletal: Degenerative change of the spine and hips. No evidence of osseous metastatic disease. IMPRESSION: 1. No acute findings in the abdomen/pelvis. 2. Stable 5.4 cm low-density mass over the dome of the liver presumed metastatic focus. Few other smaller liver hypodensities unchanged and likely due to metastatic disease. 3. Stable moderate celiac axis and portacaval adenopathy. This adenopathy causes moderate narrowing of the hepatic artery as well as mild focal narrowing of the portal vein. Stable adenopathy just inferior to the SMA as well as right periaortic region. 4. Stable 1.4 cm right adrenal nodule possibly small adenoma versus metastatic focus. 5. Stable bilateral renal cysts. 6. Mild diverticulosis of the colon. 7. Stable small pericardial effusion. 8. Aortic atherosclerosis. Aortic Atherosclerosis (ICD10-I70.0). Electronically Signed   By: Toribio Agreste M.D.   On: 10/19/2024 16:00   DG Chest 2 View Result Date: 10/19/2024 EXAM: 2 VIEW(S) XRAY OF THE CHEST 10/19/2024 03:17:36 PM COMPARISON: Previous exams are available. CLINICAL HISTORY: cough x 1 week FINDINGS: LINES, TUBES AND DEVICES: Right-sided port-a-cath is unchanged. LUNGS AND PLEURA:  Right upper lobe opacity is noted consistent with radiation fibrosis and possible right hilar mass or neoplasm. No pulmonary edema. No pleural effusion. No pneumothorax. HEART AND MEDIASTINUM: Possible right hilar mass or neoplasm. No acute abnormality of the cardiac silhouette. BONES AND SOFT TISSUES: No acute osseous abnormality. IMPRESSION: 1. 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 2. Possible right hilar mass or neoplasm. 3. Unchanged right-sided port-a-cath. Electronically signed by: Lynwood Seip MD 10/19/2024 03:22 PM EST RP Workstation: [id number]   US  Abdomen Complete Result Date: 10/19/2024 EXAM: COMPLETE ABDOMINAL ULTRASOUND TECHNIQUE: Real-time ultrasonography of the abdomen was performed. COMPARISON: CT 10/07/2024 CLINICAL HISTORY: Elevated liver enzymes, rule out obstructive cause. FINDINGS: LIVER: The liver has a nodule or contour. Small amount of fluid along the margin of the liver. Hypoechoic lesion in the dome of the liver measuring 6.5 cm compares to lesion described on comparison CT for no change. The liver has a cirrhotic morphology. No intrahepatic biliary ductal dilatation. BILIARY SYSTEM: Gallbladder wall thickness measures 3.4 mm. No pericholecystic fluid or wall thickening. No cholelithiasis. The common bile duct measures 6.3 mm and is at the upper limits of normal in diameter. No gallbladder distention. No gallbladder inflammation. KIDNEYS: Right kidney measures 12.3 cm in length. Left kidney measures 11.6 cm in length. Normal contour of kidneys. Normal cortical echogenicity. 2 anechoic large cysts of the right kidney measuring 6 cm and 5 cm, not changed from CT. Small cyst in the left kidney. No hydronephrosis. No calculus. No mass. PANCREAS: Visualized portions of the pancreas are unremarkable. SPLEEN: Spleen length measures 13.4 cm. No acute abnormality. VESSELS: Visualized portion of the aorta is normal. Visualized portion of the inferior vena cava is normal.  OTHER: No ascites. IMPRESSION: 1. No evidence of biliary duct dilatation. Common bile duct at the upper limits of normal. No cholecystitis. 2. Portal vein  patent 3. Mass lesion of the liver dome, unchanged from prior CT, previously described as treated metastasis. 4. Small amount of fluid along the margin of the liver. Cirrhotic liver morphology. 5. Benign renal cysts. No hydronephrosis. Electronically signed by: Norleen Boxer MD 10/19/2024 11:42 AM EST RP Workstation: [id number]  Procedures   Medications Ordered in the ED  0.9 %  sodium chloride  infusion (has no administration in time range)  sodium chloride  0.9 % bolus 1,000 mL (1,000 mLs Intravenous New Bag/Given 10/19/24 1514)  iohexol  (OMNIPAQUE ) 300 MG/ML solution 100 mL (100 mLs Intravenous Contrast Given 10/19/24 1530)                                    Medical Decision Making Amount and/or Complexity of Data Reviewed Labs: ordered.    Details: Acute on chronic anemia Radiology: ordered and independent interpretation performed.    Details: No CBD obstruction  Risk Prescription drug management. Decision regarding hospitalization.   Presents with worsening LFT dysfunction.  Unclear if this is from his chemotherapy therapeutics versus something else.  There is no obvious sign of an obstruction so far.  CT does not show an obvious cause.  He will need admission given worsening LFTs. Discussed with Dr. Raenelle for admission.  GI consulted though I did not hear back (updated Dr. Raenelle).     Final diagnoses:  Elevated bilirubin    ED Discharge Orders     None          Freddi Hamilton, MD 10/19/24 1658

## 2024-10-20 ENCOUNTER — Observation Stay (HOSPITAL_COMMUNITY)

## 2024-10-20 ENCOUNTER — Inpatient Hospital Stay

## 2024-10-20 DIAGNOSIS — D649 Anemia, unspecified: Secondary | ICD-10-CM | POA: Diagnosis not present

## 2024-10-20 DIAGNOSIS — R748 Abnormal levels of other serum enzymes: Secondary | ICD-10-CM | POA: Diagnosis not present

## 2024-10-20 DIAGNOSIS — C3490 Malignant neoplasm of unspecified part of unspecified bronchus or lung: Secondary | ICD-10-CM | POA: Diagnosis not present

## 2024-10-20 DIAGNOSIS — R932 Abnormal findings on diagnostic imaging of liver and biliary tract: Secondary | ICD-10-CM | POA: Diagnosis not present

## 2024-10-20 DIAGNOSIS — C7931 Secondary malignant neoplasm of brain: Secondary | ICD-10-CM | POA: Diagnosis not present

## 2024-10-20 LAB — COMPREHENSIVE METABOLIC PANEL WITH GFR
ALT: 112 U/L — ABNORMAL HIGH (ref 0–44)
AST: 124 U/L — ABNORMAL HIGH (ref 15–41)
Albumin: 2.8 g/dL — ABNORMAL LOW (ref 3.5–5.0)
Alkaline Phosphatase: 643 U/L — ABNORMAL HIGH (ref 38–126)
Anion gap: 11 (ref 5–15)
BUN: 11 mg/dL (ref 8–23)
CO2: 28 mmol/L (ref 22–32)
Calcium: 8.4 mg/dL — ABNORMAL LOW (ref 8.9–10.3)
Chloride: 96 mmol/L — ABNORMAL LOW (ref 98–111)
Creatinine, Ser: 0.69 mg/dL (ref 0.61–1.24)
GFR, Estimated: >60 mL/min (ref >60)
Glucose, Bld: 135 mg/dL — ABNORMAL HIGH (ref 70–99)
Potassium: 3.4 mmol/L — ABNORMAL LOW (ref 3.5–5.1)
Sodium: 135 mmol/L (ref 135–145)
Total Bilirubin: 5.4 mg/dL — ABNORMAL HIGH (ref 0.0–1.2)
Total Protein: 5.2 g/dL — ABNORMAL LOW (ref 6.5–8.1)

## 2024-10-20 LAB — GLUCOSE, CAPILLARY
Glucose-Capillary: 138 mg/dL — ABNORMAL HIGH (ref 70–99)
Glucose-Capillary: 130 mg/dL — ABNORMAL HIGH (ref 70–99)
Glucose-Capillary: 138 mg/dL — ABNORMAL HIGH (ref 70–99)
Glucose-Capillary: 146 mg/dL — ABNORMAL HIGH (ref 70–99)

## 2024-10-20 LAB — CBC
HCT: 24.6 % — ABNORMAL LOW (ref 39.0–52.0)
Hemoglobin: 7.5 g/dL — ABNORMAL LOW (ref 13.0–17.0)
MCH: 30.9 pg (ref 26.0–34.0)
MCHC: 30.5 g/dL (ref 30.0–36.0)
MCV: 101.2 fL — ABNORMAL HIGH (ref 80.0–100.0)
Platelets: 136 K/uL — ABNORMAL LOW (ref 150–400)
RBC: 2.43 MIL/uL — ABNORMAL LOW (ref 4.22–5.81)
RDW: 21.1 % — ABNORMAL HIGH (ref 11.5–15.5)
WBC: 6 K/uL (ref 4.0–10.5)
nRBC: 0.3 % — ABNORMAL HIGH (ref 0.0–0.2)

## 2024-10-20 MED ORDER — POTASSIUM CHLORIDE CRYS ER 20 MEQ PO TBCR
20.0000 meq | EXTENDED_RELEASE_TABLET | Freq: Two times a day (BID) | ORAL | Status: DC
Start: 2024-10-20 — End: 2024-10-22
  Administered 2024-10-20 – 2024-10-21 (×3): 20 meq via ORAL
  Filled 2024-10-20 (×3): qty 1

## 2024-10-20 MED ORDER — SODIUM CHLORIDE 0.9 % IV SOLN: INTRAVENOUS | Status: AC

## 2024-10-20 MED ORDER — MORPHINE SULFATE (PF) 2 MG/ML IV SOLN: 2.0000 mg | INTRAVENOUS | Status: AC | PRN

## 2024-10-20 NOTE — Progress Notes (Signed)
 [id number] [facility] Liaison Note  This is a current Home Health patient with [facility].  We will continue to follow for discharge disposition.  Please call with any questions or concerns.  Thanks Inocente Jacobs BSN RN [facility] Liaison  [phone]

## 2024-10-20 NOTE — TOC Initial Note (Addendum)
 Transition of Care [facility]) - Initial/Assessment Note    Patient Details  Name: Adam Wise MRN: [id number] Date of Birth: [birth year]  Transition of Care [facility]) CM/SW Contact:    Doneta Glenys DASEN, RN Phone Number: 10/20/2024, 3:40 PM  Clinical Narrative:                 MOON completed.PTA lives in a house with wife(Peg) Deihl,[name] (Spouse)[phone] DME-cane, walker, rollator, WC, HH with [facility] Inocente Jacobs BSN RN [facility] Liaison [phone] PT/RN; no provider ordered oxygen , CPAP connected to a regulator,; Peg will transport at discharge. However, may request PTAR at time of discharge. Inpatient care management will follow progression to discharge.  Expected Discharge Plan: Home w Hospice Care ([facility]) Barriers to Discharge: Continued Medical Work up   Patient Goals and CMS Choice Patient states their goals for this hospitalization and ongoing recovery are:: Home with my wife CMS Medicare.gov Compare Post Acute Care list provided to:: Patient Represenative (must comment) (Peg) Choice offered to / list presented to : Patient      Expected Discharge Plan and Services In-house Referral: Hospice / Palliative Care Discharge Planning Services: CM Consult Post Acute Care Choice: NA Living arrangements for the past 2 months: Single Family Home                 DME Arranged: N/A DME Agency: NA       HH Arranged: NA HH Agency: NA        Prior Living Arrangements/Services Living arrangements for the past 2 months: Single Family Home Lives with:: Spouse Patient language and need for interpreter reviewed:: Yes Do you feel safe going back to the place where you live?: Yes      Need for Family Participation in Patient Care: Yes (Comment) Care giver support system in place?: Yes (comment) Current home services: DME (cane, walker,rollator,WC) Criminal Activity/Legal Involvement Pertinent to Current Situation/Hospitalization: No - Comment as  needed  Activities of Daily Living   ADL Screening (condition at time of admission) Independently performs ADLs?: Yes (appropriate for developmental age) Is the patient deaf or have difficulty hearing?: No Does the patient have difficulty seeing, even when wearing glasses/contacts?: No Does the patient have difficulty concentrating, remembering, or making decisions?: No  Permission Sought/Granted Permission sought to share information with : Case Manager Permission granted to share information with : Yes, Verbal Permission Granted  Share Information with NAME: Lamprecht,[name] (Spouse)  [phone], Case Manager  Permission granted to share info w AGENCY: [facility]        Emotional Assessment Appearance:: Appears stated age Attitude/Demeanor/Rapport: Engaged Affect (typically observed): Appropriate Orientation: : Oriented to Self, Oriented to Place, Oriented to  Time, Oriented to Situation Alcohol / Substance Use: Not Applicable Psych Involvement: No (comment)  Admission diagnosis:  Bilirubinemia [E80.6] Elevated bilirubin [R17] Patient Active Problem List   Diagnosis Date Noted   Bilirubinemia 10/19/2024   Acute on chronic back pain 07/31/2024   Anemia 02/01/2024   Other pancytopenia (HCC) 02/01/2024   Tachycardia, unspecified 01/04/2024   Nasal congestion 01/04/2024   Cancer related pain 08/11/2023   Weakness 08/10/2023   General weakness 08/10/2023   Dizziness 11/12/2022   Chronic respiratory failure with hypoxia (HCC) 09/18/2022   Recurrent pleural effusion on right 01/16/2022   Preop cardiovascular exam 12/19/2021   Coronary Calcium Score 657 11/13/2021   Atypical chest pain 11/13/2021   COPD (chronic obstructive pulmonary disease) (HCC) 10/12/2021   Erectile dysfunction 10/26/2020   Port-A-Cath in place 06/11/2020  History of pulmonary embolism 05/15/2020   Small cell lung cancer, right upper lobe (HCC) 04/12/2020   Chronic fatigue 04/12/2020   Encounter  for antineoplastic chemotherapy 04/12/2020   Encounter for antineoplastic immunotherapy 04/12/2020   Malignant neoplasm of upper lobe of right lung (HCC) 04/04/2020   Goals of care, counseling/discussion 03/30/2020   Exertional dyspnea 03/26/2020   Hyperlipidemia associated with type 2 diabetes mellitus (HCC) 11/08/2019   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 [place] [address] Phone: [phone] Fax: [phone]  [id number] - [place], [place] - [address] AT [facility] OF [address] [facility] [address] & [address] [place] [address] Phone: [phone] Fax: [phone]  [facility] - [facility] [address] [place] [address] Phone: [phone] Fax: [phone]     Social Drivers of Health (SDOH) Social History: SDOH Screenings   Food Insecurity: No Food Insecurity (10/19/2024)  Housing: Low Risk  (10/19/2024)  Transportation Needs: No Transportation Needs (10/19/2024)  Utilities: Not At Risk (10/19/2024)  Alcohol Screen: Low Risk  (03/17/2024)  Depression (PHQ2-9): Low Risk  (09/23/2024)  Financial Resource Strain: Low Risk  (08/23/2024)   Received from [facility]  Physical Activity: Inactive (08/23/2024)   Received from [facility]  Social Connections: Moderately Integrated (10/19/2024)  Stress: No Stress Concern Present (08/23/2024)   Received from [facility]  Tobacco Use: Medium Risk (10/19/2024)  Health Literacy: Adequate Health Literacy (03/17/2024)   SDOH Interventions:     Readmission Risk  Interventions    08/13/2023    2:05 PM  Readmission Risk Prevention Plan  Transportation Screening Complete  HRI or Home Care Consult Complete  Social Work Consult for Recovery Care Planning/Counseling Complete  Palliative Care Screening Not Applicable  Medication Review Oceanographer) Complete

## 2024-10-20 NOTE — Care Management Obs Status (Signed)
 MEDICARE OBSERVATION STATUS NOTIFICATION   Patient Details  Name: Adam Wise MRN: [id number] Date of Birth: 1951/07/25   Medicare Observation Status Notification Given:  Yes    Doneta Glenys DASEN, RN 10/20/2024, 3:09 PM

## 2024-10-20 NOTE — Progress Notes (Signed)
     Patient Name: Adam Wise           DOB: [birth year]  MRN: [id number]      Admission Date: 10/19/2024  Attending Provider: Raenelle Coria, MD  Primary Diagnosis: Bilirubinemia   Level of care: Med-Surg   OVERNIGHT EVENT  HPI/ Events of Note Adam Wise, 73 y.o. male, was admitted on 10/19/2024 for Bilirubinemia.   Unwitnessed fall Fall occurred while patient was independently ambulating. He reports tripping over something and losing his balance.  He landed in a prone position and reports hitting his head with the edge of the table.  Patient is currently A/O x4. Denies lightheadedness, dizziness, palpitation prior to fall. Vitals are hemodynamically stable. No hypoglycemia. Patient was able to get up with assistance. Patient takes Eliquis .  He has ecchymosis to his right forehead, brow bone, and right check, as well as his neck.  Skin remains unopened, no bleeding, no obvious superficial hematoma formation at this time.  No other obvious deformities noted. He denies headache or neck pain. No vision change, double vision, sensitivity to light. He reports chronic back pain same as always.           Plan: CT head and cervical Nursing staff to continue monitoring for change in acute symptoms, mobility, and pain.   Camira Geidel, DNP, ACNPC- AG [facility] [place]

## 2024-10-20 NOTE — Progress Notes (Signed)
 PROGRESS NOTE    Adam Wise  FMW:[id number] DOB: 05/15/1951 DOA: 10/19/2024 PCP: [name], NP    Brief Narrative:  73 y.o. male with medical history significant of metastatic lung cancer currently on chemotherapy, last chemotherapy 10/31 with carboplatin  and etoposide , metastatic to liver and mediastinum, chronic back pain on morphine  pump, sleep apnea, COPD, hypertension and hyperlipidemia presented to the emergency room at suggestion of his cancer doctor with worsening bilirubin.  Patient himself has noted orange-colored urine since at least 5 days now.  He has a lot of chronic issues including chronic fatigue, low appetite, dyspnea on exertion, back pain and recently being managed with morphine  pump and with long-acting morphine  tablets on top of that. Patient went to cancer center for blood work 11/3, found to have bilirubin of 4.5, AST and ALT 123/136 respectively.  He was asked to recheck levels 11/5 and found to have bilirubin of 5.9 and AST and ALT 110/110 respectively.  He was sent to the ER for investigations. Fairly stable in the emergency room.  Mostly chronic complaints.  Hemoglobin 7.2.  Albumin 2.9. CT scan of the abdomen pelvis with known metastatic lesion in the liver, normal biliary tree. Right upper quadrant ultrasound with normal biliary tree. Admitted for symptomatic treatment.  Subjective: Patient seen in the morning rounds.  Denied any complaints and wanted to get some rest and sleep.  Overnight events noted.  He tripped on something and fell face down in the bathroom last night resulting in bruising of the right side of the head.  Skeletal survey negative.  No evidence of intracranial injury or bleeding.  Denies any nausea vomiting or abdominal pain. Hemolytic panels are mostly negative.  Bilirubin is still elevated but quite stable at 5.4.  Received 1 unit of PRBC overnight, some of that might have spilled over when falling.  Assessment & Plan:   Acute  Hyperbilirubinemia: Likely cholestatic jaundice.  There is no evidence of intra or extrabiliary obstruction on ultrasound and CT scans.  Cancer infiltration into the liver.  Secondary to chemotherapy?   Mostly direct bilirubin. Anemia panel including reticulocytes, LDH, DAT negative with minimal abnormalities.  Not suggestive of hemolysis. Continue supportive care.  Gentle IV fluids. Will discuss with GI.  Ongoing monitoring versus liver biopsy.  Symptomatic anemia: Hemoglobin was 7.2.  Iron, ferritin, folate, B12 is adequate.  Received monitor PRBC.  Hemoglobin 7.5.  Monitor.    Essential hypertension: Blood pressure is stable.    Type 2 diabetes: On metformin  and Jardiance, continue.  Keep on sliding scale insulin .   Sleep apnea: Uses CPAP at night.   Metastatic lung cancer on chemotherapy: Hold chemotherapy until liver function normalizes.   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  Will continue Eliquis  today as there is no plan for biopsy.   chronic back pain and cancer related pain: Patient has morphine  pump that is set with basal rate.  On top of that he takes MS SR.  30 mg every 8 hours for breakthrough pain.  Managed by palliative care at cancer center.  Continue.  Mechanical fall on anticoagulation: Fall precautions.  Skeletal survey negative.    DVT prophylaxis:  apixaban  (ELIQUIS ) tablet 5 mg   Code Status: Full code Family Communication: None at the bedside Disposition Plan: Status is: Observation The patient will require care spanning > 2 midnights and should be moved to inpatient because: Significant abnormal labs     Consultants:  Gastroenterology  Procedures:  None  Antimicrobials:  None     Objective: Vitals:   10/20/24 0051 10/20/24 0217 10/20/24 0254 10/20/24 0807  BP: 100/63 (!) 119/91 118/78 97/68  Pulse: (!) 102 (!) 52 (!) 119 (!) 116  Resp: 16 18 20 20   Temp: 97.6 F (36.4 C) 98.7 F (37.1 C) 97.9 F (36.6 C) 98.1 F (36.7 C)   TempSrc: Oral     SpO2: 95% (!) 88% 96% 91%  Height:        Intake/Output Summary (Last 24 hours) at 10/20/2024 1113 Last data filed at 10/20/2024 0900 Gross per 24 hour  Intake 658 ml  Output --  Net 658 ml   There were no vitals filed for this visit.  Examination:  General exam: Appears calm and comfortable.  Pleasant interactive.  Sleeping today.  Icteric. Patient has some bruising on the right side of the head and neck without any evidence of open injury or bleeding. Respiratory system: Clear to auscultation. Respiratory effort normal.  No added sounds.  Port-A-Cath present. Cardiovascular system: S1 & S2 heard, RRR.  Gastrointestinal system:.  Nontender.  Bowel sound present.  Pain pump reservoir on the right abdominal wall. Central nervous system: Alert and oriented. No focal neurological deficits.  Grossly weak.     Data Reviewed: I have personally reviewed following labs and imaging studies  CBC: Recent Labs  Lab 10/17/24 1025 10/19/24 1514 10/20/24 0344  WBC 5.9 5.2 6.0  NEUTROABS 4.6 4.4  --   HGB 8.2* 7.2* 7.5*  HCT 26.2* 23.9* 24.6*  MCV 98.5 101.7* 101.2*  PLT 184 127* 136*   Basic Metabolic Panel: Recent Labs  Lab 10/17/24 1025 10/19/24 1043 10/20/24 0344  NA 136 135 135  K 3.7 3.6 3.4*  CL 97* 95* 96*  CO2 30 30 28   GLUCOSE 179* 178* 135*  BUN 12 12 11   CREATININE 0.81 0.73 0.69  CALCIUM 8.1* 8.5* 8.4*   GFR: Estimated Creatinine Clearance: 94.5 mL/min (by C-G formula based on SCr of 0.69 mg/dL). Liver Function Tests: Recent Labs  Lab 10/17/24 1025 10/19/24 1043 10/20/24 0344  AST 123* 110* 124*  ALT 136* 110* 112*  ALKPHOS 572* 604* 643*  BILITOT 4.5* 5.9* 5.4*  PROT 5.7* 5.7* 5.2*  ALBUMIN 2.9* 2.9* 2.8*   No results for input(s): LIPASE, AMYLASE in the last 168 hours. No results for input(s): AMMONIA in the last 168 hours. Coagulation Profile: Recent Labs  Lab 10/19/24 1514  INR 1.6*   Cardiac Enzymes: No results  for input(s): CKTOTAL, CKMB, CKMBINDEX, TROPONINI in the last 168 hours. BNP (last 3 results) No results for input(s): PROBNP in the last 8760 hours. HbA1C: No results for input(s): HGBA1C in the last 72 hours. CBG: Recent Labs  Lab 10/20/24 0303 10/20/24 0803  GLUCAP 138* 130*   Lipid Profile: No results for input(s): CHOL, HDL, LDLCALC, TRIG, CHOLHDL, LDLDIRECT in the last 72 hours. Thyroid  Function Tests: No results for input(s): TSH, T4TOTAL, FREET4, T3FREE, THYROIDAB in the last 72 hours. Anemia Panel: Recent Labs    10/19/24 2005  VITAMINB12 1,125*  FOLATE >20.0  FERRITIN 5,541*  TIBC 174*  IRON 79  RETICCTPCT 4.6*   Sepsis Labs: No results for input(s): PROCALCITON, LATICACIDVEN in the last 168 hours.  No results found for this or any previous visit (from the past 240 hours).       Radiology Studies: CT CERVICAL SPINE WO CONTRAST Result Date: 10/20/2024 EXAM: CT CERVICAL SPINE WITHOUT CONTRAST 10/20/2024 04:31:46 AM TECHNIQUE: CT of the cervical spine was  performed without the administration of intravenous contrast. Multiplanar reformatted images are provided for review. Automated exposure control, iterative reconstruction, and/or weight based adjustment of the mA/kV was utilized to reduce the radiation dose to as low as reasonably achievable. COMPARISON: Cervical spine MRI 08/11/2023. CLINICAL HISTORY: 73 year old male with metastatic lung cancer. History of neck trauma and hospital fall. FINDINGS: CERVICAL SPINE: BONES AND ALIGNMENT: Mildly improved cervical lordosis from the previous MRI. Background bone mineralization within normal limits. Degenerative sclerosis in the cervical spine, also at the right TMJ. No acute or suspicious osseous lesion identified in the cervical spine. Partially visible right neck port a cath. No acute traumatic injury identified in the cervical spine. DEGENERATIVE CHANGES: Chronic severe cervical spine  disc and endplate degeneration throughout. Findings appear stable from the MRI last year. Similar advanced upper thoracic disc and endplate degeneration at T1-T2. SOFT TISSUES: No prevertebral soft tissue swelling. LUNGS: Abnormal right upper lobe is opacified. IMPRESSION: 1. No acute traumatic injury identified in the cervical spine. 2. Chronic advanced cervical spine degeneration, stable from MRI last year. 3. Right lung apex is opacified. Electronically signed by: Helayne Hurst MD 10/20/2024 04:54 AM EST RP Workstation: [id number]   CT HEAD WO CONTRAST ( ) Result Date: 10/20/2024 EXAM: CT HEAD WITHOUT CONTRAST 10/20/2024 04:31:46 AM TECHNIQUE: CT of the head was performed without the administration of intravenous contrast. Automated exposure control, iterative reconstruction, and/or weight based adjustment of the mA/kV was utilized to reduce the radiation dose to as low as reasonably achievable. COMPARISON: Head CT 01/04/2024, brain MRI 09/29/2024. CLINICAL HISTORY: 73 year old male with head trauma, minor, normal mental status, and hospital fall. Patient is on Eliquis . Brain metastases, lung cancer. FINDINGS: BRAIN AND VENTRICLES: No acute hemorrhage. No evidence of acute infarct. No hydrocephalus. No extra-axial collection. No mass effect or midline shift. Medial left parietal lobe and posterior right cerebellar brain metastases are occult on non-contrast CT. No vasogenic edema. Stable brain volume from earlier this year. No suspicious intracranial vascular hyperdensity. ORBITS: No acute abnormality. No discrete orbital injury identified. SINUSES: Paranasal sinuses, middle ears and mastoids are well aerated. SOFT TISSUES AND SKULL: No acute soft tissue abnormality. No discrete scalp soft tissue injury identified. No skull fracture. No acute or suspicious skull lesion identified. Chronic right TMJ degeneration. IMPRESSION: 1. Known brain metastases are occult by non-contrast CT. No associated vasogenic edema  or mass effect. 2. No new intracranial abnormality. Electronically signed by: Helayne Hurst MD 10/20/2024 04:44 AM EST RP Workstation: [id number]   CT ABDOMEN PELVIS W CONTRAST Result Date: 10/19/2024 CLINICAL DATA:  Abnormal LFTs. Hematuria. History small cell lung cancer. EXAM: CT ABDOMEN AND PELVIS WITH CONTRAST TECHNIQUE: Multidetector CT imaging of the abdomen and pelvis was performed using the standard protocol following bolus administration of intravenous contrast. RADIATION DOSE REDUCTION: This exam was performed according to the departmental dose-optimization program which includes automated exposure control, adjustment of the mA and/or kV according to patient size and/or use of iterative reconstruction technique. CONTRAST:  OMNIPAQUE  IOHEXOL  300 MG/ML  SOLN COMPARISON:  10/07/2024 and 08/13/2024, 02/12/2023 FINDINGS: Lower chest: Mild stable cardiomegaly with stable small pericardial effusion. Central venous catheter with tip over the right atrium unchanged. Calcified plaque over the descending thoracic aorta. Visualized lung bases demonstrate a tiny amount left pleural fluid. Tiny amount right pleural fluid with associated scarring/basilar atelectasis unchanged. Mild high density material along the right lower thoracic pleural likely from previous pleurodesis unchanged. Hepatobiliary: Stable 5.4 cm low-density mass over the dome of the liver  presumed metastatic focus. Few other smaller liver hypodensities unchanged and likely due to metastatic disease. Surgical clip over the right lobe adjacent the gallbladder fossa. Gallbladder is normal. Mild prominence of the central intrahepatic ducts unchanged. Pancreas: Unchanged. Spleen: Normal. Adrenals/Urinary Tract: Left adrenal gland is normal. Stable 1.4 cm right adrenal nodule possibly small adenoma versus metastatic focus. Kidneys are normal in size without hydronephrosis. Bilateral renal cysts unchanged. Ureters and bladder are normal. Stomach/Bowel:  Stomach and small bowel are normal. Appendix is normal. Mild diverticulosis of the colon which is otherwise unremarkable. Vascular/Lymphatic: Mild calcified plaque over the abdominal aorta which is normal in caliber. Remaining vascular structures are unremarkable. Stable moderate celiac axis and portacaval adenopathy. This adenopathy causes moderate narrowing of the hepatic artery as well as mild focal narrowing of the portal vein. Stable adenopathy the just inferior to the SMA as well as right periaortic region. Reproductive: Prostate is unremarkable. Other: No significant free fluid in the abdomen and no focal inflammatory change. Small amount of free pelvic fluid which is new. Metallic pain pump over the anterior right lower abdominal wall unchanged. Musculoskeletal: Degenerative change of the spine and hips. No evidence of osseous metastatic disease. IMPRESSION: 1. No acute findings in the abdomen/pelvis. 2. Stable 5.4 cm low-density mass over the dome of the liver presumed metastatic focus. Few other smaller liver hypodensities unchanged and likely due to metastatic disease. 3. Stable moderate celiac axis and portacaval adenopathy. This adenopathy causes moderate narrowing of the hepatic artery as well as mild focal narrowing of the portal vein. Stable adenopathy just inferior to the SMA as well as right periaortic region. 4. Stable 1.4 cm right adrenal nodule possibly small adenoma versus metastatic focus. 5. Stable bilateral renal cysts. 6. Mild diverticulosis of the colon. 7. Stable small pericardial effusion. 8. Aortic atherosclerosis. Aortic Atherosclerosis (ICD10-I70.0). Electronically Signed   By: Toribio Agreste M.D.   On: 10/19/2024 16:00   DG Chest 2 View Result Date: 10/19/2024 EXAM: 2 VIEW(S) XRAY OF THE CHEST 10/19/2024 03:17:36 PM COMPARISON: Previous exams are available. CLINICAL HISTORY: cough x 1 week FINDINGS: LINES, TUBES AND DEVICES: Right-sided port-a-cath is unchanged. LUNGS AND PLEURA:  Right upper lobe opacity is noted consistent with radiation fibrosis and possible right hilar mass or neoplasm. No pulmonary edema. No pleural effusion. No pneumothorax. HEART AND MEDIASTINUM: Possible right hilar mass or neoplasm. No acute abnormality of the cardiac silhouette. BONES AND SOFT TISSUES: No acute osseous abnormality. IMPRESSION: 1. 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 2. Possible right hilar mass or neoplasm. 3. Unchanged right-sided port-a-cath. Electronically signed by: Lynwood Seip MD 10/19/2024 03:22 PM EST RP Workstation: [id number]   US  Abdomen Complete Result Date: 10/19/2024 EXAM: COMPLETE ABDOMINAL ULTRASOUND TECHNIQUE: Real-time ultrasonography of the abdomen was performed. COMPARISON: CT 10/07/2024 CLINICAL HISTORY: Elevated liver enzymes, rule out obstructive cause. FINDINGS: LIVER: The liver has a nodule or contour. Small amount of fluid along the margin of the liver. Hypoechoic lesion in the dome of the liver measuring 6.5 cm compares to lesion described on comparison CT for no change. The liver has a cirrhotic morphology. No intrahepatic biliary ductal dilatation. BILIARY SYSTEM: Gallbladder wall thickness measures 3.4 mm. No pericholecystic fluid or wall thickening. No cholelithiasis. The common bile duct measures 6.3 mm and is at the upper limits of normal in diameter. No gallbladder distention. No gallbladder inflammation. KIDNEYS: Right kidney measures 12.3 cm in length. Left kidney measures 11.6 cm in length. Normal contour of kidneys. Normal cortical echogenicity. 2 anechoic  large cysts of the right kidney measuring 6 cm and 5 cm, not changed from CT. Small cyst in the left kidney. No hydronephrosis. No calculus. No mass. PANCREAS: Visualized portions of the pancreas are unremarkable. SPLEEN: Spleen length measures 13.4 cm. No acute abnormality. VESSELS: Visualized portion of the aorta is normal. Visualized portion of the inferior vena cava is normal.  OTHER: No ascites. IMPRESSION: 1. No evidence of biliary duct dilatation. Common bile duct at the upper limits of normal. No cholecystitis. 2. Portal vein  patent 3. Mass lesion of the liver dome, unchanged from prior CT, previously described as treated metastasis. 4. Small amount of fluid along the margin of the liver. Cirrhotic liver morphology. 5. Benign renal cysts. No hydronephrosis. Electronically signed by: Norleen Boxer MD 10/19/2024 11:42 AM EST RP Workstation: [id number]        Scheduled Meds:  sodium chloride    Intravenous Once   apixaban   5 mg Oral BID   Chlorhexidine  Gluconate Cloth  6 each Topical Daily   feeding supplement  237 mL Oral BID BM   gabapentin   300 mg Oral BID   insulin  aspart  0-5 Units Subcutaneous QHS   insulin  aspart  0-9 Units Subcutaneous TID WC   [START ON 10/21/2024] metFORMIN   1,000 mg Oral BID WC   mirtazapine   15 mg Oral QHS   multivitamin with minerals  1 tablet Oral Q breakfast   pantoprazole   40 mg Oral Daily   pioglitazone   45 mg Oral Daily   polyethylene glycol  17 g Oral Q0600   potassium chloride   20 mEq Oral BID   sodium chloride  flush  10-40 mL Intracatheter Q12H   Continuous Infusions:  sodium chloride  100 mL/hr at 10/20/24 0503     LOS: 0 days       Renato Applebaum, MD [facility]

## 2024-10-20 NOTE — Progress Notes (Signed)
 Notified patient's wife, Rollene and informed her of the unwitnessed fall.

## 2024-10-20 NOTE — Plan of Care (Signed)
   Problem: Education: Goal: Ability to describe self-care measures that may prevent or decrease complications (Diabetes Survival Skills Education) will improve Outcome: Progressing Goal: Individualized Educational Video(s) Outcome: Progressing

## 2024-10-20 NOTE — Progress Notes (Addendum)
 Patient found down on the bathroom floor while nursing doing rounds. Patient had told RN and CNA don't put the bed alarm on me, I know how to turn it off. The chest port was out and tubing for PRBC on floor, blood on the floor and on patient's chest. PRBC infusion stopped and bagged. Patient Aox4 and states he went to use the bathroom on his own and didn't want assistance. Vitals obtained, neuro assessment performed, staff placed patient back in the bed. NP notifed and came to the bedside. Patient states he hit his head and upper back when he fell. CT scan performed, chest port re-accessed and patient given Flexeril  for back muscle spasms. Bed alarm is on, call bell within reach and bedside table beside patient.

## 2024-10-20 NOTE — Consult Note (Signed)
 [facility] Consultation Note  Referring Provider: [facility] Primary Care Physician:  [name], NP Primary Gastroenterologist:  Sampson  Reason for Consultation:  elevated liver enzymes  HPI: Adam Wise is a 73 y.o. male admitted weakness, jaundice, anemia.  History of metastatic lung cancer on chemotherapy.   No abdominal pain, hematemesis, blood in stool.   Past Medical History:  Diagnosis Date   Acute on chronic respiratory failure with hypoxia (HCC) 10/12/2021   Anxiety    Arthritis    Chickenpox    Chronic knee pain    Chronic low back pain    COPD exacerbation (HCC) 10/11/2021   COPD with exacerbation (HCC) 10/12/2021   Coronary artery calcification seen on CAT scan 11/2021   Coronary CTA 11/27/2021: Moderate to large right pleural effusion and compressive atelectasis right lung base. => Coronary Calcium Score 657.  Diffuse RCA calcification.  Minimal mild disease in the LAD and diagonal branches. == Overall limited study.  Notable artifact.   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 He has disease progression after the first line of chemotherapy in December 2021.   Testosterone  deficiency    Type 2 diabetes mellitus (HCC)     Past Surgical History:  Procedure Laterality Date   CHEST TUBE INSERTION Right 01/20/2022   Procedure: CHEST TUBE INSERTION;  Surgeon: Claudene Toribio BROCKS, MD;  Location: [facility] ENDOSCOPY;  Service: Pulmonary;  Laterality: Right;  w/  Talc  Pleurodesis, planned admit for Obs afterwards   CHEST TUBE INSERTION Right 04/28/2022   Procedure: INSERTION PLEURAL DRAINAGE CATHETER - Pigtail tail, drainage;  Surgeon: Claudene Toribio BROCKS, MD;  Location: [facility] ENDOSCOPY;  Service: Pulmonary;  Laterality: Right;  talc  pleurodesis   CHEST TUBE INSERTION Right 09/01/2022   Procedure: INSERTION PLEURAL DRAINAGE CATHETER;  Surgeon: Alaine Vicenta NOVAK, MD;  Location: [facility] ENDOSCOPY;  Service: Cardiopulmonary;  Laterality: Right;  pigtail catheter placement with talc  pleurodesis   COLONOSCOPY WITH PROPOFOL  N/A 12/17/2018   Procedure: COLONOSCOPY WITH PROPOFOL ;  Surgeon: Janalyn Keene NOVAK, MD;  Location: [facility] ENDOSCOPY;  Service: Gastroenterology;  Laterality: N/A;   IR IMAGING GUIDED PORT INSERTION  04/17/2020   IR IVC FILTER RETRIEVAL / S&I /IMG GUID/MOD SED  09/19/2024   IR IVC FILTER RETRIEVAL / S&I PORTER GUID/MOD SED  09/19/2024   IR THORACENTESIS ASP PLEURAL SPACE W/IMG GUIDE  11/29/2021   IR THORACENTESIS ASP PLEURAL SPACE W/IMG GUIDE  01/03/2022   JOINT REPLACEMENT Bilateral    RADIOLOGY WITH ANESTHESIA N/A 09/19/2024   Procedure: RADIOLOGY WITH ANESTHESIA;  Surgeon: Radiologist, Medication, MD;  Location: [facility] ORS;  Service: Radiology;  Laterality: N/A;   REPLACEMENT TOTAL KNEE BILATERAL  2015   TALC  PLEURODESIS  09/01/2022   Procedure: TALC  PLEURADESIS;  Surgeon: Alaine Vicenta NOVAK, MD;  Location: [facility] ENDOSCOPY;  Service: Cardiopulmonary;;   TONSILLECTOMY  1960   TRANSTHORACIC ECHOCARDIOGRAM  05/2020   a) 05/2020: EF 55 to 60%.  No R WMA.  Mild LVH.  Indeterminate LVEDP.  Unable to assess RVP.  Normal aortic and  mitral valves.  Mildly elevated RAP.; b) 09/2021: EF 50-55%. No RWMA. Mild LVH. ~ LVEDP. Mild LA Dilation. NORMAL RV/RAP.  Normal MV/AoV.    Prior to Admission medications   Medication Sig Start Date End Date Taking? Authorizing Provider  albuterol  (VENTOLIN  HFA) 108 (90 Base) MCG/ACT inhaler Inhale 2 puffs into the lungs every 4 (four)  hours as needed for shortness of breath. 05/30/24   [name], NP  B-D 3CC LUER-LOK SYR 22GX1 22G X 1 3 ML MISC USE AS INSTRUCTED FOR TESTOSTERONE  INJECTION EVERY 2 WEEKS 03/20/21   Clark, Katherine K, NP  Black Pepper-Turmeric (TURMERIC COMPLEX/BLACK PEPPER PO) Take 1 tablet by mouth in the morning and at bedtime.    [provider]  blood glucose meter kit and supplies KIT Dispense based on patient and insurance preference. Use up to four times daily as directed. 08/25/24   Sherrod Sherrod, MD  Calcium Carb-Cholecalciferol (CALCIUM PLUS VITAMIN D3 PO) Take 1 tablet by mouth in the morning and at bedtime.    [provider]  Coenzyme Q10 (COQ10) 200 MG CAPS Take 200 mg by mouth daily.    [provider]  Continuous Glucose Sensor (FREESTYLE LIBRE 3 PLUS SENSOR) MISC Use to check blood sugar continuously. Change sensor every 15 days. 09/23/24   Clark, Katherine K, NP  cyclobenzaprine  (FLEXERIL ) 10 MG tablet Take 1 tablet (10 mg total) by mouth 2 (two) times daily as needed for muscle spasms. 07/29/24   Curatolo, Adam, DO  docusate sodium  (COLACE) 100 MG capsule Take 1 capsule (100 mg total) by mouth every 12 (twelve) hours. Patient taking differently: Take 100 mg by mouth daily as needed for moderate constipation. 08/02/23   Mannie Pac T, DO  ELIQUIS  5 MG TABS tablet TAKE 1 TABLET BY MOUTH TWICE A DAY 10/11/24   Heilingoetter, Cassandra L, PA-C  esomeprazole (NEXIUM) 20 MG capsule Take 20 mg by mouth in the morning and at bedtime.    [provider]  gabapentin  (NEURONTIN ) 300 MG capsule Take 1 capsule (300 mg total) by mouth 2 (two) times daily. For back pain. 08/10/24   Pickenpack-Cousar, Fannie SAILOR, NP  glucose blood (ONETOUCH ULTRA) test strip USE UP TO 4 TIMES DAILY AS DIRECTED 07/29/22   Clark, Katherine K, NP  HYDROcodone  bit-homatropine (HYCODAN) 5-1.5 MG/5ML syrup Take 5 mLs by mouth every 6 (six) hours as needed for cough. 10/19/24    Pickenpack-Cousar, Athena N, NP  insulin  aspart (NOVOLOG  FLEXPEN) 100 UNIT/ML FlexPen Inject 1-9 Units into the skin 3 (three) times daily with meals as needed for high blood sugar (while on chemotherapy). Patient not taking: Reported on 10/04/2024    [provider]  Insulin  Pen Needle (BD PEN NEEDLE NANO U/F) 32G X 4 MM MISC Use with insulin  as prescribed Dx Code: E11.9 05/20/23   [name], NP  ipratropium-albuterol  (DUONEB) 0.5-2.5 (3) MG/3ML SOLN Take 3 mLs by nebulization every 6 (six) hours as needed (SOB/Wheezing).    [provider]  KRILL OIL PO Take 1 capsule by mouth daily.    [provider]  Lancets MISC USE UP TO 3 TIMES DAILY AS DIRECTED 05/06/18   Clark, Katherine K, NP  lidocaine  (LIDODERM ) 5 % Place 1 patch onto the skin daily. Remove & Discard patch within 12 hours or as directed by MD 09/06/24   Pickenpack-Cousar, Fannie SAILOR, NP  metFORMIN  (GLUCOPHAGE ) 1000 MG tablet TAKE 1 TABLET (1,000 MG TOTAL) BY MOUTH 2 (TWO) TIMES DAILY WITH A MEAL.  FOR DIABETES 10/11/24   Clark, Katherine K, NP  mirtazapine  (REMERON ) 15 MG tablet TAKE 1 TABLET BY MOUTH EVERYDAY AT BEDTIME 02/28/24   Heilingoetter, Cassandra L, PA-C  morphine  (MS CONTIN ) 30 MG 12 hr tablet Take 1 tablet (30 mg total) by mouth every 8 (eight) hours as needed. Patient taking differently: Take 30 mg by mouth every 6 (six) hours as needed for pain. 09/09/24   Pickenpack-Cousar, Athena N, NP  Multiple Vitamin (MULTI-VITAMINS) TABS Take 1 tablet by mouth daily with breakfast.    [provider]  Naloxone  HCl 3 MG/0.1ML LIQD Place 1 spray into both nostrils once. For known/suspected opiod overdose. Every 2-3 minutes in alternating nostril till EMS arrives. 10/06/23   [provider]  ondansetron  (ZOFRAN ) 8 MG tablet Take 1 tablet (8 mg total) by mouth every 8 (eight) hours as needed for nausea or vomiting. 07/11/24   Heilingoetter, Cassandra L, PA-C  oxyCODONE -acetaminophen  (PERCOCET)  10-325 MG tablet Take 1-1.5 tablets by mouth every 4 (four) hours as needed for pain. 09/06/24   Pickenpack-Cousar, Athena N, NP  pioglitazone  (ACTOS ) 45 MG tablet TAKE 1 TABLET (45 MG TOTAL) BY MOUTH DAILY. FOR DIABETES. 08/17/24   Clark, Katherine K, NP  polyethylene glycol powder (GLYCOLAX /MIRALAX ) 17 GM/SCOOP powder Take 17 g by mouth daily as needed for moderate constipation or mild constipation.    [provider]  pravastatin  (PRAVACHOL ) 40 MG tablet TAKE 1 TABLET BY MOUTH EVERY DAY IN THE EVENING FOR CHOLESTEROL 08/17/24   Clark, Katherine K, NP  PRESCRIPTION MEDICATION CPAP- At bedtime    [provider]  testosterone  cypionate (DEPOTESTOSTERONE CYPIONATE) 200 MG/ML injection Inject 200 mg into the muscle every 14 (fourteen) days.    [provider]    Current Facility-Administered Medications  Medication Dose Route Frequency Provider Last Rate Last Admin   0.9 %  sodium chloride  infusion (Manually program via Guardrails IV Fluids)   Intravenous Once Ghimire, Kuber, MD       0.9 %  sodium chloride  infusion   Intravenous Continuous Ghimire, Kuber, MD 100 mL/hr at 10/20/24 0503 New Bag at 10/20/24 0503   albuterol  (PROVENTIL ) (2.5 MG/3ML) 0.083% nebulizer solution 3 mL  3 mL Inhalation Q4H PRN Raenelle Coria, MD       apixaban  (ELIQUIS ) tablet 5 mg  5 mg Oral BID Ghimire, Kuber, MD   5 mg at 10/20/24 9060   Chlorhexidine  Gluconate Cloth 2 % PADS 6 each  6 each Topical Daily Raenelle Coria, MD   6 each at 10/20/24 9057   cyclobenzaprine  (FLEXERIL ) tablet 10 mg  10 mg Oral BID PRN Ghimire, Kuber, MD   10 mg at 10/20/24 0456   docusate sodium  (COLACE) capsule 100 mg  100 mg Oral Daily PRN Raenelle Coria, MD       feeding supplement (ENSURE PLUS HIGH PROTEIN) liquid 237 mL  237 mL Oral BID BM Ghimire, Coria, MD       gabapentin  (NEURONTIN ) capsule 300 mg  300 mg Oral BID Ghimire, Kuber, MD   300 mg at 10/20/24 9061   insulin  aspart (novoLOG ) injection 0-5 Units  0-5 Units  Subcutaneous QHS Raenelle Coria, MD       insulin  aspart (novoLOG ) injection 0-9 Units  0-9 Units Subcutaneous TID WC Ghimire, Kuber, MD   1 Units at 10/20/24 0939   [START ON 10/21/2024] metFORMIN  (GLUCOPHAGE ) tablet 1,000 mg  1,000 mg Oral BID WC Raenelle Coria, MD       mirtazapine  (REMERON ) tablet 15 mg  15 mg Oral QHS Raenelle Coria, MD   15 mg at 10/19/24 2216   morphine  (MS CONTIN ) 12 hr tablet 30 mg  30 mg Oral Q8H PRN Raenelle Coria, MD   30 mg at 10/20/24 9060   morphine  (PF) 2 MG/ML injection 2 mg  2 mg Intravenous Q4H PRN Chavez, Abigail, NP       multivitamin with minerals tablet 1 tablet  1 tablet Oral Q breakfast Raenelle Coria, MD   1 tablet at 10/20/24 9061   ondansetron  (ZOFRAN ) tablet 4 mg  4 mg Oral Q6H PRN Raenelle Coria, MD       Or   ondansetron  (ZOFRAN ) injection 4 mg  4 mg Intravenous Q6H PRN Raenelle Coria, MD       pantoprazole  (PROTONIX ) EC tablet 40 mg  40 mg Oral Daily Raenelle Coria, MD   40 mg at 10/20/24 9061   pioglitazone  (ACTOS ) tablet 45 mg  45 mg Oral Daily Raenelle Coria, MD   45 mg at 10/20/24 9060   polyethylene glycol (MIRALAX  / GLYCOLAX ) packet 17 g  17 g Oral Q0600 Raenelle Coria, MD   17 g at 10/20/24 0457   potassium chloride  SA (KLOR-CON  M) CR tablet 20 mEq  20 mEq Oral BID Raenelle Coria, MD       sodium chloride  flush (NS) 0.9 % injection 10-40 mL  10-40 mL Intracatheter Q12H Raenelle Coria, MD   10 mL at 10/20/24 9057   Facility-Administered Medications Ordered in Other Encounters  Medication Dose Route Frequency Provider Last Rate Last Admin   heparin  lock flush 100 unit/mL  500 Units Intracatheter Once Sherrod Sherrod, MD       sodium chloride  flush (NS) 0.9 % injection 10 mL  10 mL Intracatheter Once Sherrod Sherrod, MD        Allergies as of 10/19/2024 - Review Complete 10/19/2024  Allergen Reaction Noted   Bupropion Other (See Comments) and Dermatitis 03/15/2014   Hydrochlorothiazide Other (See Comments) 08/13/2021   Lisinopril    09/08/2024    Family History  Problem Relation Age of Onset   Heart attack Mother    Cancer Mother    Hypertension Mother    Arthritis Father    Asthma Father    Cancer Father    COPD Father    Heart attack Father    Hypertension Sister    Cancer Sister    Diabetes Sister    Asthma Sister    Cancer Sister    COPD Sister    Arthritis Sister    Asthma Sister    Diabetes Sister    Birth defects Maternal Grandfather    Arthritis Paternal Grandmother    Diabetes Paternal Grandmother    Arthritis Paternal Grandfather    Asthma Son    Other Neg Hx        pituitary disorder    Social History   Socioeconomic History   Marital status: Married    Spouse name: Not on file   Number of children: Not on file   Years of education: Not on file   Highest education level: 12th grade  Occupational History   Not on file  Tobacco Use   Smoking status: Former    Passive exposure: Never   Smokeless tobacco: Never  Vaping Use   Vaping status: Never Used  Substance and Sexual Activity   Alcohol use: Not Currently    Comment: rarely   Drug use: Not Currently   Sexual activity: Yes  Other Topics Concern  Not on file  Social History Narrative   Married.   Moved from [place].   Retired.   Social Drivers of Corporate Investment Banker Strain: Low Risk  (08/23/2024)   Received from [facility]   Overall Financial Resource Strain (CARDIA)    How hard is it for you to pay for the very basics like food, housing, medical care, and heating?: Not hard at all  Food Insecurity: No Food Insecurity (10/19/2024)   Hunger Vital Sign    Worried About Running Out of Food in the Last Year: Never true    Ran Out of Food in the Last Year: Never true  Transportation Needs: No Transportation Needs (10/19/2024)   PRAPARE - Administrator, Civil Service (Medical): No    Lack of Transportation (Non-Medical): No  Physical Activity: Inactive (08/23/2024)   Received [facility]   Exercise  Vital Sign    On average, how many days per week do you engage in moderate to strenuous exercise (like a brisk walk)?: 0 days    Minutes of Exercise per Session: Not on file  Stress: No Stress Concern Present (08/23/2024)   Received from [facility] - Occupational Stress Questionnaire    Do you feel stress - tense, restless, nervous, or anxious, or unable to sleep at night because your mind is troubled all the time - these days?: Not at all  Social Connections: Moderately Integrated (10/19/2024)   Social Connection and Isolation Panel    Frequency of Communication with Friends and Family: More than three times a week    Frequency of Social Gatherings with Friends and Family: Once a week    Attends Religious Services: More than 4 times per year    Active Member of Clubs or Organizations: No    Attends Banker Meetings: Never    Marital Status: Married  Catering Manager Violence: Not At Risk (10/19/2024)   Humiliation, Afraid, Rape, and Kick questionnaire    Fear of Current or Ex-Partner: No    Emotionally Abused: No    Physically Abused: No    Sexually Abused: No    Review of Systems: As per HPI, all others negative  Physical Exam: Vital signs in last 24 hours: Temp:  [97.6 F (36.4 C)-98.8 F (37.1 C)] 98.1 F (36.7 C) (11/06 0807) Pulse Rate:  [52-124] 116 (11/06 0807) Resp:  [16-20] 20 (11/06 0807) BP: (97-129)/(63-91) 97/68 (11/06 0807) SpO2:  [88 %-98 %] 91 % (11/06 0807) FiO2 (%):  [21 %] 21 % (11/05 2158) Last BM Date : 10/17/24 General:   Alert, older-appearing than stated age, dyspnea, wheezing, NAD Head:  Normocephalic and atraumatic. Eyes:  Sclera icteric, Conjunctiva pale Ears:  Normal auditory acuity. Nose:  No deformity, discharge,  or lesions. Mouth:  No deformity or lesions.  Oropharynx pink & moist. Neck:  Supple; no masses or thyromegaly. Lungs:  Wheezes at rest, short of breath with minimal  movement Abdomen:  Soft, nontender and nondistended. No masses, hepatosplenomegaly or hernias noted. No peritonitis Msk:  Symmetrical without gross deformities. Normal posture. Pulses:  Normal pulses noted. Extremities:  Without clubbing or edema. Neurologic:  Alert and  oriented x4; diffusely weak, no encephalopathy Skin:  scattered ecchymoses and telangiectasias, otherwise Intact without significant lesions or rashes. Psych:  Alert and cooperative. Normal mood and affect.   Lab Results: Recent Labs    10/19/24 1514 10/20/24 0344  WBC 5.2 6.0  HGB 7.2*  7.5*  HCT 23.9* 24.6*  PLT 127* 136*   BMET Recent Labs    10/19/24 1043 10/20/24 0344  NA 135 135  K 3.6 3.4*  CL 95* 96*  CO2 30 28  GLUCOSE 178* 135*  BUN 12 11  CREATININE 0.73 0.69  CALCIUM 8.5* 8.4*   LFT Recent Labs    10/19/24 2005 10/20/24 0344  PROT  --  5.2*  ALBUMIN  --  2.8*  AST  --  124*  ALT  --  112*  ALKPHOS  --  643*  BILITOT  --  5.4*  BILIDIR 4.4*  --    PT/INR Recent Labs    10/19/24 1514  LABPROT 20.1*  INR 1.6*    Studies/Results: CT CERVICAL SPINE WO CONTRAST Result Date: 10/20/2024 EXAM: CT CERVICAL SPINE WITHOUT CONTRAST 10/20/2024 04:31:46 AM TECHNIQUE: CT of the cervical spine was performed without the administration of intravenous contrast. Multiplanar reformatted images are provided for review. Automated exposure control, iterative reconstruction, and/or weight based adjustment of the mA/kV was utilized to reduce the radiation dose to as low as reasonably achievable. COMPARISON: Cervical spine MRI 08/11/2023. CLINICAL HISTORY: 73 year old male with metastatic lung cancer. History of neck trauma and hospital fall. FINDINGS: CERVICAL SPINE: BONES AND ALIGNMENT: Mildly improved cervical lordosis from the previous MRI. Background bone mineralization within normal limits. Degenerative sclerosis in the cervical spine, also at the right TMJ. No acute or suspicious osseous lesion  identified in the cervical spine. Partially visible right neck port a cath. No acute traumatic injury identified in the cervical spine. DEGENERATIVE CHANGES: Chronic severe cervical spine disc and endplate degeneration throughout. Findings appear stable from the MRI last year. Similar advanced upper thoracic disc and endplate degeneration at T1-T2. SOFT TISSUES: No prevertebral soft tissue swelling. LUNGS: Abnormal right upper lobe is opacified. IMPRESSION: 1. No acute traumatic injury identified in the cervical spine. 2. Chronic advanced cervical spine degeneration, stable from MRI last year. 3. Right lung apex is opacified. Electronically signed by: Helayne Hurst MD 10/20/2024 04:54 AM EST RP Workstation: [id number]   CT HEAD WO CONTRAST ( ) Result Date: 10/20/2024 EXAM: CT HEAD WITHOUT CONTRAST 10/20/2024 04:31:46 AM TECHNIQUE: CT of the head was performed without the administration of intravenous contrast. Automated exposure control, iterative reconstruction, and/or weight based adjustment of the mA/kV was utilized to reduce the radiation dose to as low as reasonably achievable. COMPARISON: Head CT 01/04/2024, brain MRI 09/29/2024. CLINICAL HISTORY: 73 year old male with head trauma, minor, normal mental status, and hospital fall. Patient is on Eliquis . Brain metastases, lung cancer. FINDINGS: BRAIN AND VENTRICLES: No acute hemorrhage. No evidence of acute infarct. No hydrocephalus. No extra-axial collection. No mass effect or midline shift. Medial left parietal lobe and posterior right cerebellar brain metastases are occult on non-contrast CT. No vasogenic edema. Stable brain volume from earlier this year. No suspicious intracranial vascular hyperdensity. ORBITS: No acute abnormality. No discrete orbital injury identified. SINUSES: Paranasal sinuses, middle ears and mastoids are well aerated. SOFT TISSUES AND SKULL: No acute soft tissue abnormality. No discrete scalp soft tissue injury identified. No skull  fracture. No acute or suspicious skull lesion identified. Chronic right TMJ degeneration. IMPRESSION: 1. Known brain metastases are occult by non-contrast CT. No associated vasogenic edema or mass effect. 2. No new intracranial abnormality. Electronically signed by: Helayne Hurst MD 10/20/2024 04:44 AM EST RP Workstation: [id number]   CT ABDOMEN PELVIS W CONTRAST Result Date: 10/19/2024 CLINICAL DATA:  Abnormal LFTs. Hematuria. History small cell lung cancer. EXAM:  CT ABDOMEN AND PELVIS WITH CONTRAST TECHNIQUE: Multidetector CT imaging of the abdomen and pelvis was performed using the standard protocol following bolus administration of intravenous contrast. RADIATION DOSE REDUCTION: This exam was performed according to the departmental dose-optimization program which includes automated exposure control, adjustment of the mA and/or kV according to patient size and/or use of iterative reconstruction technique. CONTRAST:  OMNIPAQUE  IOHEXOL  300 MG/ML  SOLN COMPARISON:  10/07/2024 and 08/13/2024, 02/12/2023 FINDINGS: Lower chest: Mild stable cardiomegaly with stable small pericardial effusion. Central venous catheter with tip over the right atrium unchanged. Calcified plaque over the descending thoracic aorta. Visualized lung bases demonstrate a tiny amount left pleural fluid. Tiny amount right pleural fluid with associated scarring/basilar atelectasis unchanged. Mild high density material along the right lower thoracic pleural likely from previous pleurodesis unchanged. Hepatobiliary: Stable 5.4 cm low-density mass over the dome of the liver presumed metastatic focus. Few other smaller liver hypodensities unchanged and likely due to metastatic disease. Surgical clip over the right lobe adjacent the gallbladder fossa. Gallbladder is normal. Mild prominence of the central intrahepatic ducts unchanged. Pancreas: Unchanged. Spleen: Normal. Adrenals/Urinary Tract: Left adrenal gland is normal. Stable 1.4 cm right  adrenal nodule possibly small adenoma versus metastatic focus. Kidneys are normal in size without hydronephrosis. Bilateral renal cysts unchanged. Ureters and bladder are normal. Stomach/Bowel: Stomach and small bowel are normal. Appendix is normal. Mild diverticulosis of the colon which is otherwise unremarkable. Vascular/Lymphatic: Mild calcified plaque over the abdominal aorta which is normal in caliber. Remaining vascular structures are unremarkable. Stable moderate celiac axis and portacaval adenopathy. This adenopathy causes moderate narrowing of the hepatic artery as well as mild focal narrowing of the portal vein. Stable adenopathy the just inferior to the SMA as well as right periaortic region. Reproductive: Prostate is unremarkable. Other: No significant free fluid in the abdomen and no focal inflammatory change. Small amount of free pelvic fluid which is new. Metallic pain pump over the anterior right lower abdominal wall unchanged. Musculoskeletal: Degenerative change of the spine and hips. No evidence of osseous metastatic disease. IMPRESSION: 1. No acute findings in the abdomen/pelvis. 2. Stable 5.4 cm low-density mass over the dome of the liver presumed metastatic focus. Few other smaller liver hypodensities unchanged and likely due to metastatic disease. 3. Stable moderate celiac axis and portacaval adenopathy. This adenopathy causes moderate narrowing of the hepatic artery as well as mild focal narrowing of the portal vein. Stable adenopathy just inferior to the SMA as well as right periaortic region. 4. Stable 1.4 cm right adrenal nodule possibly small adenoma versus metastatic focus. 5. Stable bilateral renal cysts. 6. Mild diverticulosis of the colon. 7. Stable small pericardial effusion. 8. Aortic atherosclerosis. Aortic Atherosclerosis (ICD10-I70.0). Electronically Signed   By: Toribio Agreste M.D.   On: 10/19/2024 16:00   DG Chest 2 View Result Date: 10/19/2024 EXAM: 2 VIEW(S) XRAY OF THE  CHEST 10/19/2024 03:17:36 PM COMPARISON: Previous exams are available. CLINICAL HISTORY: cough x 1 week FINDINGS: LINES, TUBES AND DEVICES: Right-sided port-a-cath is unchanged. LUNGS AND PLEURA: Right upper lobe opacity is noted consistent with radiation fibrosis and possible right hilar mass or neoplasm. No pulmonary edema. No pleural effusion. No pneumothorax. HEART AND MEDIASTINUM: Possible right hilar mass or neoplasm. No acute abnormality of the cardiac silhouette. BONES AND SOFT TISSUES: No acute osseous abnormality. IMPRESSION: 1. 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 2. Possible right hilar mass or neoplasm. 3. Unchanged right-sided port-a-cath. Electronically signed by: Lynwood Seip MD 10/19/2024 03:22  PM EST RP Workstation: [id number]   US  Abdomen Complete Result Date: 10/19/2024 EXAM: COMPLETE ABDOMINAL ULTRASOUND TECHNIQUE: Real-time ultrasonography of the abdomen was performed. COMPARISON: CT 10/07/2024 CLINICAL HISTORY: Elevated liver enzymes, rule out obstructive cause. FINDINGS: LIVER: The liver has a nodule or contour. Small amount of fluid along the margin of the liver. Hypoechoic lesion in the dome of the liver measuring 6.5 cm compares to lesion described on comparison CT for no change. The liver has a cirrhotic morphology. No intrahepatic biliary ductal dilatation. BILIARY SYSTEM: Gallbladder wall thickness measures 3.4 mm. No pericholecystic fluid or wall thickening. No cholelithiasis. The common bile duct measures 6.3 mm and is at the upper limits of normal in diameter. No gallbladder distention. No gallbladder inflammation. KIDNEYS: Right kidney measures 12.3 cm in length. Left kidney measures 11.6 cm in length. Normal contour of kidneys. Normal cortical echogenicity. 2 anechoic large cysts of the right kidney measuring 6 cm and 5 cm, not changed from CT. Small cyst in the left kidney. No hydronephrosis. No calculus. No mass. PANCREAS: Visualized portions of the  pancreas are unremarkable. SPLEEN: Spleen length measures 13.4 cm. No acute abnormality. VESSELS: Visualized portion of the aorta is normal. Visualized portion of the inferior vena cava is normal. OTHER: No ascites. IMPRESSION: 1. No evidence of biliary duct dilatation. Common bile duct at the upper limits of normal. No cholecystitis. 2. Portal vein  patent 3. Mass lesion of the liver dome, unchanged from prior CT, previously described as treated metastasis. 4. Small amount of fluid along the margin of the liver. Cirrhotic liver morphology. 5. Benign renal cysts. No hydronephrosis. Electronically signed by: Norleen Boxer MD 10/19/2024 11:42 AM EST RP Workstation: [id number]    Impression:   Metastatic lung cancer. Elevated LFTs, new onset.  Suspect either from liver metastases or medication effect.   Do not see significant biliary obstruction. Chronic anemia.  Plan:  Check acute hepatitis panel and CMV/EBV studies. No role for ERCP at this time. Follow LFTs and PT/INR. Follow CBC.   Transfuse as needed for anemia.  High risk for endoscopic intervention.  Would not pursue endoscopic evaluation in absence of frank destabilizing bleeding. [facility] will follow at a distance.   LOS: 0 days   Lichelle Viets M  10/20/2024, 11:24 AM  Cell [phone] If no answer or after 5 PM call [phone]

## 2024-10-20 NOTE — Progress Notes (Signed)
   10/20/24 2218  BiPAP/CPAP/SIPAP  BiPAP/CPAP/SIPAP Pt Type Adult  BiPAP/CPAP/SIPAP Resmed (self administer)  Mask Type Full face mask  Dentures removed? Not applicable  Mask Size Medium  EPAP 10 cmH2O  FiO2 (%) 21 %  Patient Home Machine No  Patient Home Mask No  Patient Home Tubing No  Auto Titrate No  CPAP/SIPAP surface wiped down Yes  Device Plugged into RED Power Outlet Yes

## 2024-10-20 NOTE — Progress Notes (Signed)
   10/19/24 2158  BiPAP/CPAP/SIPAP  $ Non-Invasive Home Ventilator  Initial  $ Face Mask Medium Yes  BiPAP/CPAP/SIPAP Pt Type Adult  BiPAP/CPAP/SIPAP Resmed  Mask Type Full face mask  Dentures removed? Not applicable  Mask Size Medium  EPAP 10 cmH2O  FiO2 (%) 21 %  Patient Home Machine No  Patient Home Mask No  Patient Home Tubing No  Auto Titrate No  CPAP/SIPAP surface wiped down Yes  Device Plugged into RED Power Outlet Yes

## 2024-10-21 ENCOUNTER — Inpatient Hospital Stay

## 2024-10-21 ENCOUNTER — Encounter: Payer: Self-pay | Admitting: Internal Medicine

## 2024-10-21 LAB — COMPREHENSIVE METABOLIC PANEL WITH GFR
ALT: 110 U/L — ABNORMAL HIGH (ref 0–44)
AST: 136 U/L — ABNORMAL HIGH (ref 15–41)
Albumin: 2.7 g/dL — ABNORMAL LOW (ref 3.5–5.0)
Alkaline Phosphatase: 759 U/L — ABNORMAL HIGH (ref 38–126)
Anion gap: 9 (ref 5–15)
BUN: 9 mg/dL (ref 8–23)
CO2: 29 mmol/L (ref 22–32)
Calcium: 8.3 mg/dL — ABNORMAL LOW (ref 8.9–10.3)
Chloride: 97 mmol/L — ABNORMAL LOW (ref 98–111)
Creatinine, Ser: 0.7 mg/dL (ref 0.61–1.24)
GFR, Estimated: >60 mL/min (ref >60)
Glucose, Bld: 134 mg/dL — ABNORMAL HIGH (ref 70–99)
Potassium: 3.8 mmol/L (ref 3.5–5.1)
Sodium: 135 mmol/L (ref 135–145)
Total Bilirubin: 6.2 mg/dL — ABNORMAL HIGH (ref 0.0–1.2)
Total Protein: 5.1 g/dL — ABNORMAL LOW (ref 6.5–8.1)

## 2024-10-21 LAB — CBC WITH DIFFERENTIAL/PLATELET
Abs Immature Granulocytes: 0.1 K/uL — ABNORMAL HIGH (ref 0.00–0.07)
Basophils Absolute: 0 K/uL (ref 0.0–0.1)
Basophils Relative: 1 %
Eosinophils Absolute: 0 K/uL (ref 0.0–0.5)
Eosinophils Relative: 0 %
HCT: 24.2 % — ABNORMAL LOW (ref 39.0–52.0)
Hemoglobin: 7.4 g/dL — ABNORMAL LOW (ref 13.0–17.0)
Immature Granulocytes: 2 %
Lymphocytes Relative: 5 %
Lymphs Abs: 0.3 K/uL — ABNORMAL LOW (ref 0.7–4.0)
MCH: 31.1 pg (ref 26.0–34.0)
MCHC: 30.6 g/dL (ref 30.0–36.0)
MCV: 101.7 fL — ABNORMAL HIGH (ref 80.0–100.0)
Monocytes Absolute: 0.8 K/uL (ref 0.1–1.0)
Monocytes Relative: 13 %
Neutro Abs: 5 K/uL (ref 1.7–7.7)
Neutrophils Relative %: 79 %
Platelets: 142 K/uL — ABNORMAL LOW (ref 150–400)
RBC: 2.38 MIL/uL — ABNORMAL LOW (ref 4.22–5.81)
RDW: 21.6 % — ABNORMAL HIGH (ref 11.5–15.5)
Smear Review: NORMAL
WBC: 6.3 K/uL (ref 4.0–10.5)
nRBC: 0.3 % — ABNORMAL HIGH (ref 0.0–0.2)

## 2024-10-21 LAB — GLUCOSE, CAPILLARY
Glucose-Capillary: 128 mg/dL — ABNORMAL HIGH (ref 70–99)
Glucose-Capillary: 149 mg/dL — ABNORMAL HIGH (ref 70–99)
Glucose-Capillary: 156 mg/dL — ABNORMAL HIGH (ref 70–99)

## 2024-10-21 LAB — HEPATITIS PANEL, ACUTE
HCV Ab: NONREACTIVE
Hep A IgM: NONREACTIVE
Hep B C IgM: NONREACTIVE
Hepatitis B Surface Ag: NONREACTIVE

## 2024-10-21 LAB — PREPARE RBC (CROSSMATCH)

## 2024-10-21 MED ORDER — HEPARIN SOD (PORK) LOCK FLUSH 100 UNIT/ML IV SOLN
500.0000 [IU] | INTRAVENOUS | Status: AC | PRN
  Administered 2024-10-21: 500 [IU]
  Filled 2024-10-21: qty 5

## 2024-10-21 MED ORDER — SODIUM CHLORIDE 0.9% IV SOLUTION: Freq: Once | INTRAVENOUS | Status: AC

## 2024-10-21 NOTE — Progress Notes (Signed)
 PROGRESS NOTE    Osborne Serio  FMW:[id number] DOB: 11-Aug-1951 DOA: 10/19/2024 PCP: [name], NP    Brief Narrative:  73 y.o. male with medical history significant of metastatic lung cancer currently on chemotherapy, last chemotherapy 10/31 with carboplatin  and etoposide , metastatic to liver and mediastinum, chronic back pain on morphine  pump, sleep apnea, COPD, hypertension and hyperlipidemia presented to the emergency room at suggestion of his cancer doctor with worsening bilirubin.  Patient himself has noted orange-colored urine since at least 5 days now.  He has a lot of chronic issues including chronic fatigue, low appetite, dyspnea on exertion, back pain and recently being managed with morphine  pump and with long-acting morphine  tablets on top of that. Patient went to cancer center for blood work 11/3, found to have bilirubin of 4.5, AST and ALT 123/136 respectively.  He was asked to recheck levels 11/5 and found to have bilirubin of 5.9 and AST and ALT 110/110 respectively.  He was sent to the ER for investigations. Fairly stable in the emergency room.  Mostly chronic complaints.  Hemoglobin 7.2.  Albumin 2.9. CT scan of the abdomen pelvis with known metastatic lesion in the liver, normal biliary tree. Right upper quadrant ultrasound with normal biliary tree. Admitted for symptomatic treatment.  Subjective: Patient seen and examined.  Denies any new symptoms. Apparently, he did not receive the PRBC on the day of fall, it spilled over and was not given yet.  Will give him 1 unit of PRBC today. Mostly chronic symptoms.  Denies any acute issues.  Urine is still very orangey. Bilirubin is 6.  Transaminases remained stable. Hepatitis panel is negative.  Assessment & Plan:   Acute Hyperbilirubinemia with mild transaminases.  Cholestatic jaundice likely secondary to tumor infiltration.  There is no evidence of intra or extrabiliary obstruction on ultrasound and CT scans.  No  evidence of hemolysis.   No evidence of biliary obstruction.   Acute hepatitis panel, negative  Statin discontinued  GI following.  Recommended conservative management.  Symptomatic anemia: Hemoglobin was 7.2.  Iron, ferritin, folate, B12 is adequate.  Incomplete PRBC transfusion.  1 unit PRBC today for symptom relief.      Essential hypertension: Blood pressure is stable.    Type 2 diabetes: On metformin  and Jardiance, continue.  Keep on sliding scale insulin .   Sleep apnea: Uses CPAP at night.   Metastatic lung cancer on chemotherapy: Hold chemotherapy until liver function normalizes.   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  Will continue Eliquis  as there is no plan for biopsy or procedure.   chronic back pain and cancer related pain: Patient has morphine  pump that is set with basal rate.  On top of that he takes MS SR.  30 mg every 8 hours for breakthrough pain.  Managed by palliative care at cancer center.  Continue.  Mechanical fall on anticoagulation: Fall precautions.  Skeletal survey negative.  Goal of care: Progressive metastatic cancer, now with progressive jaundice.  No reversible cause found.  Remains fairly debilitated.  Currently full code. Seen by palliative care at cancer center. Seen by [facility] at home. Will consult palliative care.  Unable to connect to his primary oncologist.  May benefit with palliation or hospice at home.    DVT prophylaxis:  apixaban  (ELIQUIS ) tablet 5 mg   Code Status: Full code Family Communication: None at the bedside Disposition Plan: Status is: Inpatient.  Persistently symptomatic.   Consultants:  Gastroenterology  Procedures:  None  Antimicrobials:  None     Objective: Vitals:   10/21/24 0442 10/21/24 0730 10/21/24 1026 10/21/24 1027  BP: 129/82 123/82 126/77 126/77  Pulse: (!) 117 (!) 129 (!) 109 (!) 109  Resp: 18 16 18 18   Temp: 98.1 F (36.7 C) 98.1 F (36.7 C) 97.9 F (36.6 C) 97.9 F  (36.6 C)  TempSrc: Oral   Oral  SpO2: 90% 94% 95%   Height:        Intake/Output Summary (Last 24 hours) at 10/21/2024 1136 Last data filed at 10/21/2024 0444 Gross per 24 hour  Intake --  Output 1125 ml  Net -1125 ml   There were no vitals filed for this visit.  Examination:  General exam: Appears calm and comfortable.  Chronically sick looking.  Icteric. Patient has some bruising on the right side of the head and neck without any evidence of open injury or bleeding. Respiratory system: Clear to auscultation. Respiratory effort normal.  No added sounds.  Port-A-Cath present. Cardiovascular system: S1 & S2 heard, RRR.  Gastrointestinal system:.  Nontender.  Bowel sound present.  Pain pump reservoir on the right abdominal wall. Central nervous system: Alert and oriented. No focal neurological deficits.  Grossly weak.     Data Reviewed: I have personally reviewed following labs and imaging studies  CBC: Recent Labs  Lab 10/17/24 1025 10/19/24 1514 10/20/24 0344 10/21/24 0334  WBC 5.9 5.2 6.0 6.3  NEUTROABS 4.6 4.4  --  5.0  HGB 8.2* 7.2* 7.5* 7.4*  HCT 26.2* 23.9* 24.6* 24.2*  MCV 98.5 101.7* 101.2* 101.7*  PLT 184 127* 136* 142*   Basic Metabolic Panel: Recent Labs  Lab 10/17/24 1025 10/19/24 1043 10/20/24 0344 10/21/24 0334  NA 136 135 135 135  K 3.7 3.6 3.4* 3.8  CL 97* 95* 96* 97*  CO2 30 30 28 29   GLUCOSE 179* 178* 135* 134*  BUN 12 12 11 9   CREATININE 0.81 0.73 0.69 0.70  CALCIUM 8.1* 8.5* 8.4* 8.3*   GFR: Estimated Creatinine Clearance: 94.5 mL/min (by C-G formula based on SCr of 0.7 mg/dL). Liver Function Tests: Recent Labs  Lab 10/17/24 1025 10/19/24 1043 10/20/24 0344 10/21/24 0334  AST 123* 110* 124* 136*  ALT 136* 110* 112* 110*  ALKPHOS 572* 604* 643* 759*  BILITOT 4.5* 5.9* 5.4* 6.2*  PROT 5.7* 5.7* 5.2* 5.1*  ALBUMIN 2.9* 2.9* 2.8* 2.7*   No results for input(s): LIPASE, AMYLASE in the last 168 hours. No results for input(s):  AMMONIA in the last 168 hours. Coagulation Profile: Recent Labs  Lab 10/19/24 1514  INR 1.6*   Cardiac Enzymes: No results for input(s): CKTOTAL, CKMB, CKMBINDEX, TROPONINI in the last 168 hours. BNP (last 3 results) No results for input(s): PROBNP in the last 8760 hours. HbA1C: No results for input(s): HGBA1C in the last 72 hours. CBG: Recent Labs  Lab 10/20/24 1209 10/20/24 1708 10/20/24 2113 10/21/24 0728 10/21/24 1129  GLUCAP 138* 128* 146* 149* 156*   Lipid Profile: No results for input(s): CHOL, HDL, LDLCALC, TRIG, CHOLHDL, LDLDIRECT in the last 72 hours. Thyroid  Function Tests: No results for input(s): TSH, T4TOTAL, FREET4, T3FREE, THYROIDAB in the last 72 hours. Anemia Panel: Recent Labs    10/19/24 2005  VITAMINB12 1,125*  FOLATE >20.0  FERRITIN 5,541*  TIBC 174*  IRON 79  RETICCTPCT 4.6*   Sepsis Labs: No results for input(s): PROCALCITON, LATICACIDVEN in the last 168 hours.  No results found for this or any previous visit (from the past 240  hours).       Radiology Studies: CT CERVICAL SPINE WO CONTRAST Result Date: 10/20/2024 EXAM: CT CERVICAL SPINE WITHOUT CONTRAST 10/20/2024 04:31:46 AM TECHNIQUE: CT of the cervical spine was performed without the administration of intravenous contrast. Multiplanar reformatted images are provided for review. Automated exposure control, iterative reconstruction, and/or weight based adjustment of the mA/kV was utilized to reduce the radiation dose to as low as reasonably achievable. COMPARISON: Cervical spine MRI 08/11/2023. CLINICAL HISTORY: 73 year old male with metastatic lung cancer. History of neck trauma and hospital fall. FINDINGS: CERVICAL SPINE: BONES AND ALIGNMENT: Mildly improved cervical lordosis from the previous MRI. Background bone mineralization within normal limits. Degenerative sclerosis in the cervical spine, also at the right TMJ. No acute or suspicious osseous  lesion identified in the cervical spine. Partially visible right neck port a cath. No acute traumatic injury identified in the cervical spine. DEGENERATIVE CHANGES: Chronic severe cervical spine disc and endplate degeneration throughout. Findings appear stable from the MRI last year. Similar advanced upper thoracic disc and endplate degeneration at T1-T2. SOFT TISSUES: No prevertebral soft tissue swelling. LUNGS: Abnormal right upper lobe is opacified. IMPRESSION: 1. No acute traumatic injury identified in the cervical spine. 2. Chronic advanced cervical spine degeneration, stable from MRI last year. 3. Right lung apex is opacified. Electronically signed by: Helayne Hurst MD 10/20/2024 04:54 AM EST RP Workstation: [id number]   CT HEAD WO CONTRAST ( ) Result Date: 10/20/2024 EXAM: CT HEAD WITHOUT CONTRAST 10/20/2024 04:31:46 AM TECHNIQUE: CT of the head was performed without the administration of intravenous contrast. Automated exposure control, iterative reconstruction, and/or weight based adjustment of the mA/kV was utilized to reduce the radiation dose to as low as reasonably achievable. COMPARISON: Head CT 01/04/2024, brain MRI 09/29/2024. CLINICAL HISTORY: 73 year old male with head trauma, minor, normal mental status, and hospital fall. Patient is on Eliquis . Brain metastases, lung cancer. FINDINGS: BRAIN AND VENTRICLES: No acute hemorrhage. No evidence of acute infarct. No hydrocephalus. No extra-axial collection. No mass effect or midline shift. Medial left parietal lobe and posterior right cerebellar brain metastases are occult on non-contrast CT. No vasogenic edema. Stable brain volume from earlier this year. No suspicious intracranial vascular hyperdensity. ORBITS: No acute abnormality. No discrete orbital injury identified. SINUSES: Paranasal sinuses, middle ears and mastoids are well aerated. SOFT TISSUES AND SKULL: No acute soft tissue abnormality. No discrete scalp soft tissue injury identified. No  skull fracture. No acute or suspicious skull lesion identified. Chronic right TMJ degeneration. IMPRESSION: 1. Known brain metastases are occult by non-contrast CT. No associated vasogenic edema or mass effect. 2. No new intracranial abnormality. Electronically signed by: Helayne Hurst MD 10/20/2024 04:44 AM EST RP Workstation: [id number]   CT ABDOMEN PELVIS W CONTRAST Result Date: 10/19/2024 CLINICAL DATA:  Abnormal LFTs. Hematuria. History small cell lung cancer. EXAM: CT ABDOMEN AND PELVIS WITH CONTRAST TECHNIQUE: Multidetector CT imaging of the abdomen and pelvis was performed using the standard protocol following bolus administration of intravenous contrast. RADIATION DOSE REDUCTION: This exam was performed according to the departmental dose-optimization program which includes automated exposure control, adjustment of the mA and/or kV according to patient size and/or use of iterative reconstruction technique. CONTRAST:  OMNIPAQUE  IOHEXOL  300 MG/ML  SOLN COMPARISON:  10/07/2024 and 08/13/2024, 02/12/2023 FINDINGS: Lower chest: Mild stable cardiomegaly with stable small pericardial effusion. Central venous catheter with tip over the right atrium unchanged. Calcified plaque over the descending thoracic aorta. Visualized lung bases demonstrate a tiny amount left pleural fluid. Tiny amount right pleural  fluid with associated scarring/basilar atelectasis unchanged. Mild high density material along the right lower thoracic pleural likely from previous pleurodesis unchanged. Hepatobiliary: Stable 5.4 cm low-density mass over the dome of the liver presumed metastatic focus. Few other smaller liver hypodensities unchanged and likely due to metastatic disease. Surgical clip over the right lobe adjacent the gallbladder fossa. Gallbladder is normal. Mild prominence of the central intrahepatic ducts unchanged. Pancreas: Unchanged. Spleen: Normal. Adrenals/Urinary Tract: Left adrenal gland is normal. Stable 1.4 cm  right adrenal nodule possibly small adenoma versus metastatic focus. Kidneys are normal in size without hydronephrosis. Bilateral renal cysts unchanged. Ureters and bladder are normal. Stomach/Bowel: Stomach and small bowel are normal. Appendix is normal. Mild diverticulosis of the colon which is otherwise unremarkable. Vascular/Lymphatic: Mild calcified plaque over the abdominal aorta which is normal in caliber. Remaining vascular structures are unremarkable. Stable moderate celiac axis and portacaval adenopathy. This adenopathy causes moderate narrowing of the hepatic artery as well as mild focal narrowing of the portal vein. Stable adenopathy the just inferior to the SMA as well as right periaortic region. Reproductive: Prostate is unremarkable. Other: No significant free fluid in the abdomen and no focal inflammatory change. Small amount of free pelvic fluid which is new. Metallic pain pump over the anterior right lower abdominal wall unchanged. Musculoskeletal: Degenerative change of the spine and hips. No evidence of osseous metastatic disease. IMPRESSION: 1. No acute findings in the abdomen/pelvis. 2. Stable 5.4 cm low-density mass over the dome of the liver presumed metastatic focus. Few other smaller liver hypodensities unchanged and likely due to metastatic disease. 3. Stable moderate celiac axis and portacaval adenopathy. This adenopathy causes moderate narrowing of the hepatic artery as well as mild focal narrowing of the portal vein. Stable adenopathy just inferior to the SMA as well as right periaortic region. 4. Stable 1.4 cm right adrenal nodule possibly small adenoma versus metastatic focus. 5. Stable bilateral renal cysts. 6. Mild diverticulosis of the colon. 7. Stable small pericardial effusion. 8. Aortic atherosclerosis. Aortic Atherosclerosis (ICD10-I70.0). Electronically Signed   By: Toribio Agreste M.D.   On: 10/19/2024 16:00   DG Chest 2 View Result Date: 10/19/2024 EXAM: 2 VIEW(S) XRAY OF  THE CHEST 10/19/2024 03:17:36 PM COMPARISON: Previous exams are available. CLINICAL HISTORY: cough x 1 week FINDINGS: LINES, TUBES AND DEVICES: Right-sided port-a-cath is unchanged. LUNGS AND PLEURA: Right upper lobe opacity is noted consistent with radiation fibrosis and possible right hilar mass or neoplasm. No pulmonary edema. No pleural effusion. No pneumothorax. HEART AND MEDIASTINUM: Possible right hilar mass or neoplasm. No acute abnormality of the cardiac silhouette. BONES AND SOFT TISSUES: No acute osseous abnormality. IMPRESSION: 1. 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 2. Possible right hilar mass or neoplasm. 3. Unchanged right-sided port-a-cath. Electronically signed by: Lynwood Seip MD 10/19/2024 03:22 PM EST RP Workstation: [id number]        Scheduled Meds:  sodium chloride    Intravenous Once   apixaban   5 mg Oral BID   Chlorhexidine  Gluconate Cloth  6 each Topical Daily   feeding supplement  237 mL Oral BID BM   gabapentin   300 mg Oral BID   insulin  aspart  0-5 Units Subcutaneous QHS   insulin  aspart  0-9 Units Subcutaneous TID WC   metFORMIN   1,000 mg Oral BID WC   mirtazapine   15 mg Oral QHS   multivitamin with minerals  1 tablet Oral Q breakfast   pantoprazole   40 mg Oral Daily   pioglitazone   45 mg  Oral Daily   polyethylene glycol  17 g Oral Q0600   potassium chloride   20 mEq Oral BID   sodium chloride  flush  10-40 mL Intracatheter Q12H   Continuous Infusions:     LOS: 1 day       Breeann Reposa, MD [facility]

## 2024-10-21 NOTE — Plan of Care (Signed)
     Referral received for Adam Wise :goals of care discussion. Chart reviewed and updates received. Per discussions with Dr. Raenelle patient scheduled for blood transfusion today with potential discharge over the next 24 hours.   I connected by phone with wife. She shares patient states his goal is to return home later today. Patient is actively followed outpatient by [facility] Palliative program and I have been closely following patient in our [facility] for ongoing symptom management support and conversations.   I discussed with wife the need for further goals of care discussions given Adam Wise's recent health challenges specific to tumor burden and increase in bilirubin (most likely related to his cancer) with no clear obstruction. She verbalized understanding and is realistic. Patient continues to have some discomfort however pain is much better controlled with IT pain pump placement and recent adjustments made.   We will follow chart closely and offer ongoing discussions over the weekend if patient does not discharge otherwise wife is agreeable to continue goals of care discussions and symptom management outpatient. Adam Wise has been scheduled to come in Tuesday 11/11 for labs, and to see myself and Dr. Sherrod.   All questions answered and support provided.    Thank you for your referral and allowing [facility] to assist in [name]'s care.   Levon Borer, AGPCNP-BC Palliative Medicine Team  Phone: [phone] Pager: [phone] Amion: GEANNIE Freud

## 2024-10-21 NOTE — Discharge Summary (Signed)
 Physician Discharge Summary  Zuhayr Deeney FMW:[id number] DOB: 01-05-1951 DOA: 10/19/2024  PCP: [name], NP  Admit date: 10/19/2024 Discharge date: 10/21/2024  Admitted From: Home Disposition: Home with home health  Recommendations for Outpatient Follow-up:  Follow up with oncology clinic on Monday Repeat lab at Monday appointment.  Home Health: Already available Equipment/Devices: Already available  Discharge Condition: Fair CODE STATUS: Full code Diet recommendation: Low-salt diet  Discharge summary: 73 y.o. male with medical history significant of metastatic lung cancer currently on chemotherapy, last chemotherapy 10/31 with carboplatin  and etoposide , metastatic to liver and mediastinum, chronic back pain on morphine  pump, sleep apnea, COPD, hypertension and hyperlipidemia presented to the emergency room at suggestion of his cancer doctor with worsening bilirubin.  Patient himself has noted orange-colored urine since at least 5 days now.  He has a lot of chronic issues including chronic fatigue, low appetite, dyspnea on exertion, back pain and recently being managed with morphine  pump and with long-acting morphine  tablets on top of that. Patient went to cancer center for blood work 11/3, found to have bilirubin of 4.5, AST and ALT 123/136 respectively.  He was asked to recheck levels 11/5 and found to have bilirubin of 5.9 and AST and ALT 110/110 respectively.  He was sent to the ER for investigations. Fairly stable in the emergency room.  Mostly chronic complaints.  Hemoglobin 7.2.  Albumin 2.9. CT scan of the abdomen pelvis with known metastatic lesion in the liver, normal biliary tree. Right upper quadrant ultrasound with normal biliary tree. Admitted for symptomatic treatment.  # Acute Hyperbilirubinemia with mild transaminases.  Cholestatic jaundice likely secondary to tumor infiltration.  There is no evidence of intra or extrabiliary obstruction on ultrasound and CT  scans.  No evidence of hemolysis.   No evidence of biliary obstruction.   Acute hepatitis panel, negative  Statin discontinued  GI following.  Recommended conservative management. See goal of care discussion below.   Symptomatic anemia: Hemoglobin was 7.2.  Iron, ferritin, folate, B12 is adequate.  Received 1 unit of PRBC today before discharge.   Essential hypertension: Blood pressure is stable.    Type 2 diabetes: On metformin  and Jardiance, continue.     Sleep apnea: Uses CPAP at night.   Metastatic lung cancer on chemotherapy: Hold chemotherapy until liver function normalizes.   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  Will continue Eliquis  as there is no plan for biopsy or procedure.   chronic back pain and cancer related pain: Patient has morphine  pump that is set with basal rate.  On top of that he takes MS SR.  30 mg every 8 hours for breakthrough pain.  Managed by palliative care at cancer center.  Continue.    Goal of care: Progressive metastatic cancer, now with progressive jaundice.  No reversible cause found.  Remains fairly debilitated.  Currently full code. Seen by palliative care at cancer center. Seen by [facility] at home. Case discussed with patient, family and palliative care.  Patient is stable and willing to go home today.  Will have repeat labs and visiting goal of care discussion Monday at cancer center.  Stable to go home with outpatient follow-up.  Discharge Diagnoses:  Principal Problem:   Bilirubinemia Active Problems:   Essential hypertension   Type 2 diabetes mellitus (HCC)   GERD (gastroesophageal reflux disease)   OSA (obstructive sleep apnea)   Hyperlipidemia associated with type 2 diabetes mellitus (HCC)   Small cell lung cancer, right upper  lobe [facility])   Anemia    Discharge Instructions  Discharge Instructions     Diet - low sodium heart healthy   Complete by: As directed    Increase activity slowly   Complete  by: As directed       Allergies as of 10/21/2024       Reactions   Bupropion Other (See Comments), Dermatitis   Racing heart palpitations  Racing heart  palpitations Racing heart palpitations Racing heart    palpitations    Racing heart    palpitations Racing heart palpitations Racing heart    palpitations, Racing heart, palpitations, Racing heart   Hydrochlorothiazide Other (See Comments)   Cramping to lower extremities   Lisinopril     Cough        Medication List     TAKE these medications    albuterol  108 (90 Base) MCG/ACT inhaler Commonly known as: VENTOLIN  HFA Inhale 2 puffs into the lungs every 4 (four) hours as needed for shortness of breath.   CALCIUM PLUS VITAMIN D3 PO Take 1 tablet by mouth in the morning and at bedtime.   CoQ10 200 MG Caps Take 200 mg by mouth daily.   cyclobenzaprine  10 MG tablet Commonly known as: FLEXERIL  Take 1 tablet (10 mg total) by mouth 2 (two) times daily as needed for muscle spasms.   docusate sodium  100 MG capsule Commonly known as: COLACE Take 1 capsule (100 mg total) by mouth every 12 (twelve) hours. What changed:  when to take this reasons to take this   Eliquis  5 MG Tabs tablet Generic drug: apixaban  TAKE 1 TABLET BY MOUTH TWICE A DAY   esomeprazole 20 MG capsule Commonly known as: NEXIUM Take 20 mg by mouth in the morning and at bedtime.   gabapentin  300 MG capsule Commonly known as: NEURONTIN  Take 1 capsule (300 mg total) by mouth 2 (two) times daily. For back pain.   HYDROcodone  bit-homatropine 5-1.5 MG/5ML syrup Commonly known as: Hycodan Take 5 mLs by mouth every 6 (six) hours as needed for cough.   ipratropium-albuterol  0.5-2.5 (3) MG/3ML Soln Commonly known as: DUONEB Take 3 mLs by nebulization every 6 (six) hours as needed (SOB/Wheezing).   KRILL OIL PO Take 1 capsule by mouth daily.   Lancets Misc USE UP TO 3 TIMES DAILY AS DIRECTED   lidocaine  5 % Commonly known as: LIDODERM  Place 1  patch onto the skin daily. Remove & Discard patch within 12 hours or as directed by MD   metFORMIN  1000 MG tablet Commonly known as: GLUCOPHAGE  TAKE 1 TABLET (1,000 MG TOTAL) BY MOUTH 2 (TWO) TIMES DAILY WITH A MEAL. FOR DIABETES   mirtazapine  15 MG tablet Commonly known as: REMERON  TAKE 1 TABLET BY MOUTH EVERYDAY AT BEDTIME   morphine  30 MG 12 hr tablet Commonly known as: MS CONTIN  Take 1 tablet (30 mg total) by mouth every 8 (eight) hours as needed. What changed:  when to take this reasons to take this   Multi-Vitamins Tabs Take 1 tablet by mouth daily with breakfast.   Naloxone  HCl 3 MG/0.1ML Liqd Place 1 spray into both nostrils once. For known/suspected opiod overdose. Every 2-3 minutes in alternating nostril till EMS arrives.   NovoLOG  FlexPen 100 UNIT/ML FlexPen Generic drug: insulin  aspart Inject 1-9 Units into the skin 3 (three) times daily with meals as needed for high blood sugar (while on chemotherapy).   ondansetron  8 MG tablet Commonly known as: ZOFRAN  Take 1 tablet (8 mg total) by mouth every  8 (eight) hours as needed for nausea or vomiting.   oxyCODONE -acetaminophen  10-325 MG tablet Commonly known as: Percocet Take 1-1.5 tablets by mouth every 4 (four) hours as needed for pain.   pioglitazone  45 MG tablet Commonly known as: ACTOS  TAKE 1 TABLET (45 MG TOTAL) BY MOUTH DAILY. FOR DIABETES.   polyethylene glycol powder 17 GM/SCOOP powder Commonly known as: GLYCOLAX /MIRALAX  Take 17 g by mouth daily as needed for moderate constipation or mild constipation.   pravastatin  40 MG tablet Commonly known as: PRAVACHOL  TAKE 1 TABLET BY MOUTH EVERY DAY IN THE EVENING FOR CHOLESTEROL   PRESCRIPTION MEDICATION CPAP- At bedtime   testosterone  cypionate 200 MG/ML injection Commonly known as: DEPOTESTOSTERONE CYPIONATE Inject 200 mg into the muscle every 14 (fourteen) days.   TURMERIC COMPLEX/BLACK PEPPER PO Take 1 tablet by mouth in the morning and at bedtime.         Allergies  Allergen Reactions   Bupropion Other (See Comments) and Dermatitis    Racing heart  palpitations  Racing heart  palpitations Racing heart palpitations Racing heart    palpitations    Racing heart    palpitations Racing heart palpitations Racing heart    palpitations, Racing heart, palpitations, Racing heart   Hydrochlorothiazide Other (See Comments)    Cramping to lower extremities   Lisinopril      Cough     Consultations: Gastroenterology   Procedures/Studies: CT CERVICAL SPINE WO CONTRAST Result Date: 10/20/2024 EXAM: CT CERVICAL SPINE WITHOUT CONTRAST 10/20/2024 04:31:46 AM TECHNIQUE: CT of the cervical spine was performed without the administration of intravenous contrast. Multiplanar reformatted images are provided for review. Automated exposure control, iterative reconstruction, and/or weight based adjustment of the mA/kV was utilized to reduce the radiation dose to as low as reasonably achievable. COMPARISON: Cervical spine MRI 08/11/2023. CLINICAL HISTORY: 73 year old male with metastatic lung cancer. History of neck trauma and hospital fall. FINDINGS: CERVICAL SPINE: BONES AND ALIGNMENT: Mildly improved cervical lordosis from the previous MRI. Background bone mineralization within normal limits. Degenerative sclerosis in the cervical spine, also at the right TMJ. No acute or suspicious osseous lesion identified in the cervical spine. Partially visible right neck port a cath. No acute traumatic injury identified in the cervical spine. DEGENERATIVE CHANGES: Chronic severe cervical spine disc and endplate degeneration throughout. Findings appear stable from the MRI last year. Similar advanced upper thoracic disc and endplate degeneration at T1-T2. SOFT TISSUES: No prevertebral soft tissue swelling. LUNGS: Abnormal right upper lobe is opacified. IMPRESSION: 1. No acute traumatic injury identified in the cervical spine. 2. Chronic advanced cervical spine  degeneration, stable from MRI last year. 3. Right lung apex is opacified. Electronically signed by: Helayne Hurst MD 10/20/2024 04:54 AM EST RP Workstation: [id number]   CT HEAD WO CONTRAST ( ) Result Date: 10/20/2024 EXAM: CT HEAD WITHOUT CONTRAST 10/20/2024 04:31:46 AM TECHNIQUE: CT of the head was performed without the administration of intravenous contrast. Automated exposure control, iterative reconstruction, and/or weight based adjustment of the mA/kV was utilized to reduce the radiation dose to as low as reasonably achievable. COMPARISON: Head CT 01/04/2024, brain MRI 09/29/2024. CLINICAL HISTORY: 73 year old male with head trauma, minor, normal mental status, and hospital fall. Patient is on Eliquis . Brain metastases, lung cancer. FINDINGS: BRAIN AND VENTRICLES: No acute hemorrhage. No evidence of acute infarct. No hydrocephalus. No extra-axial collection. No mass effect or midline shift. Medial left parietal lobe and posterior right cerebellar brain metastases are occult on non-contrast CT. No vasogenic edema. Stable brain volume from earlier  this year. No suspicious intracranial vascular hyperdensity. ORBITS: No acute abnormality. No discrete orbital injury identified. SINUSES: Paranasal sinuses, middle ears and mastoids are well aerated. SOFT TISSUES AND SKULL: No acute soft tissue abnormality. No discrete scalp soft tissue injury identified. No skull fracture. No acute or suspicious skull lesion identified. Chronic right TMJ degeneration. IMPRESSION: 1. Known brain metastases are occult by non-contrast CT. No associated vasogenic edema or mass effect. 2. No new intracranial abnormality. Electronically signed by: Helayne Hurst MD 10/20/2024 04:44 AM EST RP Workstation: [id number]   CT ABDOMEN PELVIS W CONTRAST Result Date: 10/19/2024 CLINICAL DATA:  Abnormal LFTs. Hematuria. History small cell lung cancer. EXAM: CT ABDOMEN AND PELVIS WITH CONTRAST TECHNIQUE: Multidetector CT imaging of the abdomen and  pelvis was performed using the standard protocol following bolus administration of intravenous contrast. RADIATION DOSE REDUCTION: This exam was performed according to the departmental dose-optimization program which includes automated exposure control, adjustment of the mA and/or kV according to patient size and/or use of iterative reconstruction technique. CONTRAST:  OMNIPAQUE  IOHEXOL  300 MG/ML  SOLN COMPARISON:  10/07/2024 and 08/13/2024, 02/12/2023 FINDINGS: Lower chest: Mild stable cardiomegaly with stable small pericardial effusion. Central venous catheter with tip over the right atrium unchanged. Calcified plaque over the descending thoracic aorta. Visualized lung bases demonstrate a tiny amount left pleural fluid. Tiny amount right pleural fluid with associated scarring/basilar atelectasis unchanged. Mild high density material along the right lower thoracic pleural likely from previous pleurodesis unchanged. Hepatobiliary: Stable 5.4 cm low-density mass over the dome of the liver presumed metastatic focus. Few other smaller liver hypodensities unchanged and likely due to metastatic disease. Surgical clip over the right lobe adjacent the gallbladder fossa. Gallbladder is normal. Mild prominence of the central intrahepatic ducts unchanged. Pancreas: Unchanged. Spleen: Normal. Adrenals/Urinary Tract: Left adrenal gland is normal. Stable 1.4 cm right adrenal nodule possibly small adenoma versus metastatic focus. Kidneys are normal in size without hydronephrosis. Bilateral renal cysts unchanged. Ureters and bladder are normal. Stomach/Bowel: Stomach and small bowel are normal. Appendix is normal. Mild diverticulosis of the colon which is otherwise unremarkable. Vascular/Lymphatic: Mild calcified plaque over the abdominal aorta which is normal in caliber. Remaining vascular structures are unremarkable. Stable moderate celiac axis and portacaval adenopathy. This adenopathy causes moderate narrowing of the  hepatic artery as well as mild focal narrowing of the portal vein. Stable adenopathy the just inferior to the SMA as well as right periaortic region. Reproductive: Prostate is unremarkable. Other: No significant free fluid in the abdomen and no focal inflammatory change. Small amount of free pelvic fluid which is new. Metallic pain pump over the anterior right lower abdominal wall unchanged. Musculoskeletal: Degenerative change of the spine and hips. No evidence of osseous metastatic disease. IMPRESSION: 1. No acute findings in the abdomen/pelvis. 2. Stable 5.4 cm low-density mass over the dome of the liver presumed metastatic focus. Few other smaller liver hypodensities unchanged and likely due to metastatic disease. 3. Stable moderate celiac axis and portacaval adenopathy. This adenopathy causes moderate narrowing of the hepatic artery as well as mild focal narrowing of the portal vein. Stable adenopathy just inferior to the SMA as well as right periaortic region. 4. Stable 1.4 cm right adrenal nodule possibly small adenoma versus metastatic focus. 5. Stable bilateral renal cysts. 6. Mild diverticulosis of the colon. 7. Stable small pericardial effusion. 8. Aortic atherosclerosis. Aortic Atherosclerosis (ICD10-I70.0). Electronically Signed   By: Toribio Agreste M.D.   On: 10/19/2024 16:00   DG Chest 2 View Result  Date: 10/19/2024 EXAM: 2 VIEW(S) XRAY OF THE CHEST 10/19/2024 03:17:36 PM COMPARISON: Previous exams are available. CLINICAL HISTORY: cough x 1 week FINDINGS: LINES, TUBES AND DEVICES: Right-sided port-a-cath is unchanged. LUNGS AND PLEURA: Right upper lobe opacity is noted consistent with radiation fibrosis and possible right hilar mass or neoplasm. No pulmonary edema. No pleural effusion. No pneumothorax. HEART AND MEDIASTINUM: Possible right hilar mass or neoplasm. No acute abnormality of the cardiac silhouette. BONES AND SOFT TISSUES: No acute osseous abnormality. IMPRESSION: 1. 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 2. Possible right hilar mass or neoplasm. 3. Unchanged right-sided port-a-cath. Electronically signed by: Lynwood Seip MD 10/19/2024 03:22 PM EST RP Workstation: [id number]   US  Abdomen Complete Result Date: 10/19/2024 EXAM: COMPLETE ABDOMINAL ULTRASOUND TECHNIQUE: Real-time ultrasonography of the abdomen was performed. COMPARISON: CT 10/07/2024 CLINICAL HISTORY: Elevated liver enzymes, rule out obstructive cause. FINDINGS: LIVER: The liver has a nodule or contour. Small amount of fluid along the margin of the liver. Hypoechoic lesion in the dome of the liver measuring 6.5 cm compares to lesion described on comparison CT for no change. The liver has a cirrhotic morphology. No intrahepatic biliary ductal dilatation. BILIARY SYSTEM: Gallbladder wall thickness measures 3.4 mm. No pericholecystic fluid or wall thickening. No cholelithiasis. The common bile duct measures 6.3 mm and is at the upper limits of normal in diameter. No gallbladder distention. No gallbladder inflammation. KIDNEYS: Right kidney measures 12.3 cm in length. Left kidney measures 11.6 cm in length. Normal contour of kidneys. Normal cortical echogenicity. 2 anechoic large cysts of the right kidney measuring 6 cm and 5 cm, not changed from CT. Small cyst in the left kidney. No hydronephrosis. No calculus. No mass. PANCREAS: Visualized portions of the pancreas are unremarkable. SPLEEN: Spleen length measures 13.4 cm. No acute abnormality. VESSELS: Visualized portion of the aorta is normal. Visualized portion of the inferior vena cava is normal. OTHER: No ascites. IMPRESSION: 1. No evidence of biliary duct dilatation. Common bile duct at the upper limits of normal. No cholecystitis. 2. Portal vein  patent 3. Mass lesion of the liver dome, unchanged from prior CT, previously described as treated metastasis. 4. Small amount of fluid along the margin of the liver. Cirrhotic liver morphology. 5. Benign renal  cysts. No hydronephrosis. Electronically signed by: Norleen Boxer MD 10/19/2024 11:42 AM EST RP Workstation: [id number]   CT CHEST ABDOMEN PELVIS W CONTRAST Result Date: 10/09/2024 CLINICAL DATA:  Small-cell lung cancer restaging * Tracking Code: BO * EXAM: CT CHEST, ABDOMEN, AND PELVIS WITH CONTRAST TECHNIQUE: Multidetector CT imaging of the chest, abdomen and pelvis was performed following the standard protocol during bolus administration of intravenous contrast. RADIATION DOSE REDUCTION: This exam was performed according to the departmental dose-optimization program which includes automated exposure control, adjustment of the mA and/or kV according to patient size and/or use of iterative reconstruction technique. CONTRAST:  OMNIPAQUE  IOHEXOL  300 MG/ML  SOLN COMPARISON:  08/13/2024 FINDINGS: CT CHEST FINDINGS Cardiovascular: Right chest port catheter. Aortic atherosclerosis. Normal heart size. Three-vessel coronary artery calcifications. Small pericardial effusion. Mediastinum/Nodes: Unchanged appearance of treated soft tissue about the right hilum and prominent subcentimeter pretracheal lymph nodes (series 2, image 26, 24). Thyroid  gland, trachea, and esophagus demonstrate no significant findings. Lungs/Pleura: Slight interval increase in dense, masslike consolidation and atelectasis of the right apex, the right lung now almost completely consolidated. Additional extensive perihilar consolidation of the right middle and right lower lobes. Small, loculated right pleural effusion with associated pleural thickening and hyperdensity, in  keeping with talc  pleurodesis, unchanged. Left lung is normally aerated. Musculoskeletal: No chest wall abnormality. No acute osseous findings. CT ABDOMEN PELVIS FINDINGS Hepatobiliary: No solid liver abnormality is seen. Unchanged appearance of a treated, predominantly cystic metastasis in the superior liver dome, hepatic segment VIII, measuring 5.7 x 3.4 cm (series 2, image  47). No gallstones, gallbladder wall thickening, or biliary dilatation. Pancreas: Unremarkable. No pancreatic ductal dilatation or surrounding inflammatory changes. Spleen: Normal in size without significant abnormality. Adrenals/Urinary Tract: Adrenal glands are unremarkable. Benign bilateral renal cortical cysts for which no further follow-up or characterization is required. Kidneys are otherwise normal, without renal calculi, solid lesion, or hydronephrosis. Thickening of the urinary bladder, likely secondary to chronic outlet obstruction. Stomach/Bowel: Stomach is within normal limits. Appendix appears normal. No evidence of bowel wall thickening, distention, or inflammatory changes. Vascular/Lymphatic: Aortic atherosclerosis. No significant change in porta hepatis and portacaval lymph nodes, measuring up to 3.5 x 2.3 cm (series 2, image 61). Reproductive: No mass or other abnormality. Other: No abdominal wall hernia. Right lower quadrant subcutaneous infusion pump. No ascites. Musculoskeletal: No acute osseous findings. IMPRESSION: 1. Slight interval increase in dense, masslike consolidation and atelectasis of the right apex, the right lung now almost completely consolidated. Additional extensive perihilar consolidation of the right middle and right lower lobes. 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 2. Unchanged appearance of treated soft tissue about the right hilum and prominent subcentimeter pretracheal lymph nodes. 3. Small, loculated right pleural effusion with associated pleural thickening and hyperdensity, in keeping with talc  pleurodesis, unchanged. 4. Unchanged treated metastasis in the liver dome. 5. No significant change in portacaval and porta hepatis lymphadenopathy. 6. Coronary artery disease. Aortic Atherosclerosis (ICD10-I70.0). Electronically Signed   By: Marolyn JONETTA Jaksch M.D.   On: 10/09/2024 09:16   MR Brain W Wo Contrast Result Date:  09/29/2024 EXAM: MRI BRAIN WITH AND WITHOUT CONTRAST 09/29/2024 06:22:48 PM TECHNIQUE: Multiplanar multisequence MRI of the head/brain was performed with and without the administration of 10mL gadobutrol  (GADAVIST ) 1 MMOL/ML injection 10 mL GADOBUTROL  1 MMOL/ML IV SOLN. COMPARISON: Comparison made with prior study from 09/06/2024. CLINICAL HISTORY: Metastatic disease evaluation; 3T SRS Protocol for radiation treatment planning. FINDINGS: BRAIN AND VENTRICLES: No acute infarct. No acute intracranial hemorrhage. Single chronic microhemorrhage noted within the peripheral left cerebellar hemisphere, stable. No mass effect or midline shift. No hydrocephalus. The sella is unremarkable. Normal flow voids. Enhancing mass involving the peripheral aspect of the inferior right cerebellum again seen lesion measures 1.1 x 0.6 x 1.0 cm (series 1100, image 128). Additional enhancing lesion involving the peripheral parafalcine left parietal cortex measures 1.0 x 0.8 x 1.1 cm (series 11, image 245). Associated diffusion and FLAIR signal abnormality again noted. No significant surrounding edema or regional mass effect. Overall, changes are concerning for metastatic disease, and are not significantly changed as compared to prior brain MRI from 09/06/2024. No new lesions identified. Note made of a small linear focus of enhancement involving the mid right cerebellar hemisphere (series 1100, image 150) felt to be consistent with a vascular structure. This is better seen on prior MRI. ORBITS: No acute abnormality. SINUSES: No acute abnormality. BONES AND SOFT TISSUES: Normal bone marrow signal and enhancement. No acute soft tissue abnormality. IMPRESSION: 1. Enhancing lesions involving the inferior right cerebellum and left parietal cortex as detailed above, concerning for metastatic disease. No significant surrounding edema or regional mass effect. 2. Overall, appearance is stable as compared to prior MRI from 09/06/24. No new lesions.  Electronically signed by: Morene Hoard MD 09/29/2024 09:42 PM EDT RP Workstation: [id number]   (Echo, Carotid, EGD, Colonoscopy, ERCP)    Subjective: Patient seen and examined in the morning rounds.  Again examined in the afternoon for discharge readiness.  He received 1 unit of blood.  Patient tells me that he feels fine.  Denies any nausea vomiting.  He feels comfortable with plan to go home. Conference call with palliative care at cancer center, Levon who has coordinated with family to come back to cancer center on 11/10 to repeat labs, discuss further goal of care including palliation.   Discharge Exam: Vitals:   10/21/24 1027 10/21/24 1136  BP: 126/77 134/85  Pulse: (!) 109 (!) 105  Resp: 18 17  Temp: 97.9 F (36.6 C) 97.9 F (36.6 C)  SpO2:  93%   Vitals:   10/21/24 0730 10/21/24 1026 10/21/24 1027 10/21/24 1136  BP: 123/82 126/77 126/77 134/85  Pulse: (!) 129 (!) 109 (!) 109 (!) 105  Resp: 16 18 18 17   Temp: 98.1 F (36.7 C) 97.9 F (36.6 C) 97.9 F (36.6 C) 97.9 F (36.6 C)  TempSrc:   Oral   SpO2: 94% 95%  93%  Height:        General: Pt is alert, awake, not in acute distress Chronically sick looking.  Very frail and debilitated.  Icteric. Cardiovascular: RRR, S1/S2 +, no rubs, no gallops, Port-A-Cath on the right chest wall. Respiratory: CTA bilaterally, no wheezing, no rhonchi Abdominal: Soft, NT, ND, bowel sounds +, pain pump present right lateral quadrant.  Nontender.     The results of significant diagnostics from this hospitalization (including imaging, microbiology, ancillary and laboratory) are listed below for reference.     Microbiology: No results found for this or any previous visit (from the past 240 hours).   Labs: BNP (last 3 results) No results for input(s): BNP in the last 8760 hours. Basic Metabolic Panel: Recent Labs  Lab 10/17/24 1025 10/19/24 1043 10/20/24 0344 10/21/24 0334  NA 136 135 135 135  K 3.7 3.6 3.4* 3.8   CL 97* 95* 96* 97*  CO2 30 30 28 29   GLUCOSE 179* 178* 135* 134*  BUN 12 12 11 9   CREATININE 0.81 0.73 0.69 0.70  CALCIUM 8.1* 8.5* 8.4* 8.3*   Liver Function Tests: Recent Labs  Lab 10/17/24 1025 10/19/24 1043 10/20/24 0344 10/21/24 0334  AST 123* 110* 124* 136*  ALT 136* 110* 112* 110*  ALKPHOS 572* 604* 643* 759*  BILITOT 4.5* 5.9* 5.4* 6.2*  PROT 5.7* 5.7* 5.2* 5.1*  ALBUMIN 2.9* 2.9* 2.8* 2.7*   No results for input(s): LIPASE, AMYLASE in the last 168 hours. No results for input(s): AMMONIA in the last 168 hours. CBC: Recent Labs  Lab 10/17/24 1025 10/19/24 1514 10/20/24 0344 10/21/24 0334  WBC 5.9 5.2 6.0 6.3  NEUTROABS 4.6 4.4  --  5.0  HGB 8.2* 7.2* 7.5* 7.4*  HCT 26.2* 23.9* 24.6* 24.2*  MCV 98.5 101.7* 101.2* 101.7*  PLT 184 127* 136* 142*   Cardiac Enzymes: No results for input(s): CKTOTAL, CKMB, CKMBINDEX, TROPONINI in the last 168 hours. BNP: Invalid input(s): POCBNP CBG: Recent Labs  Lab 10/20/24 1209 10/20/24 1708 10/20/24 2113 10/21/24 0728 10/21/24 1129  GLUCAP 138* 128* 146* 149* 156*   D-Dimer No results for input(s): DDIMER in the last 72 hours. Hgb A1c No results for input(s): HGBA1C in the last 72 hours. Lipid Profile No results for input(s): CHOL, HDL, LDLCALC, TRIG, CHOLHDL, LDLDIRECT  in the last 72 hours. Thyroid  function studies No results for input(s): TSH, T4TOTAL, T3FREE, THYROIDAB in the last 72 hours.  Invalid input(s): FREET3 Anemia work up Recent Labs    10/19/24 2005  VITAMINB12 1,125*  FOLATE >20.0  FERRITIN 5,541*  TIBC 174*  IRON 79  RETICCTPCT 4.6*   Urinalysis    Component Value Date/Time   COLORURINE AMBER (A) 10/19/2024 1440   APPEARANCEUR CLEAR 10/19/2024 1440   LABSPEC 1.030 10/19/2024 1440   PHURINE 5.0 10/19/2024 1440   GLUCOSEU 50 (A) 10/19/2024 1440   GLUCOSEU 100 (A) 11/25/2017 1359   HGBUR NEGATIVE 10/19/2024 1440   BILIRUBINUR MODERATE (A)  10/19/2024 1440   BILIRUBINUR neg 01/01/2024 1358   KETONESUR 5 (A) 10/19/2024 1440   PROTEINUR 30 (A) 10/19/2024 1440   UROBILINOGEN 1.0 01/01/2024 1358   UROBILINOGEN 0.2 11/25/2017 1359   NITRITE NEGATIVE 10/19/2024 1440   LEUKOCYTESUR NEGATIVE 10/19/2024 1440   Sepsis Labs Recent Labs  Lab 10/17/24 1025 10/19/24 1514 10/20/24 0344 10/21/24 0334  WBC 5.9 5.2 6.0 6.3   Microbiology No results found for this or any previous visit (from the past 240 hours).   Time coordinating discharge: 35 minutes  SIGNED:   Renato Applebaum, MD  [facility] 10/21/2024, 2:30 PM

## 2024-10-22 LAB — EBV AB TO VIRAL CAPSID AG PNL, IGG+IGM
EBV VCA IgG: >600 U/mL — ABNORMAL HIGH (ref 0.0–17.9)
EBV VCA IgM: <36 U/mL (ref 0.0–35.9)

## 2024-10-22 LAB — CMV IGM: CMV IgM: <30 [AU]/ml (ref 0.0–29.9)

## 2024-10-24 ENCOUNTER — Telehealth: Payer: Self-pay | Admitting: *Deleted

## 2024-10-24 LAB — TYPE AND SCREEN
ABO/RH(D): B POS
Antibody Screen: NEGATIVE
Unit division: 0
Unit division: 0

## 2024-10-24 LAB — BPAM RBC
Blood Product Expiration Date: 202511292359
Blood Product Expiration Date: 202511292359
ISSUE DATE / TIME: 202511060023
ISSUE DATE / TIME: 202511070953
Unit Type and Rh: 7300
Unit Type and Rh: 7300

## 2024-10-24 NOTE — Transitions of Care (Post Inpatient/ED Visit) (Signed)
 10/24/2024  Name: Adam Wise MRN: [id number] DOB: 24-Jan-1951  Today's TOC FU Call Status: Today's TOC FU Call Status:: Successful TOC FU Call Completed TOC FU Call Complete Date: 10/24/24 Patient's Name and Date of Birth confirmed.  Transition Care Management Follow-up Telephone Call Date of Discharge: 10/21/24 Discharge Facility: [facility]) How have you been since you were released from the hospital?: Better Any questions or concerns?: Yes Patient Questions/Concerns:: They couldn't figure out why he was jaundice  Items Reviewed: Did you receive and understand the discharge instructions provided?: Yes Medications obtained,verified, and reconciled?: Yes (Medications Reviewed) (no new medications) Any new allergies since your discharge?: No Dietary orders reviewed?: Yes Type of Diet Ordered:: low sodium (patient appetite is poor and not tolerating protein drinks. Encouraged wife to make milkshakes.) Do you have support at home?: Yes People in Home [RPT]: spouse Name of Support/Comfort Primary Source: margaret  wife  Medications Reviewed Today: Medications Reviewed Today     Reviewed by Kennieth Cathlean DEL, RN (Case Manager) on 10/24/24 at 1218  Med List Status: <None>   Medication Order Taking? Sig Documenting Provider Last Dose Status Informant  albuterol  (VENTOLIN  HFA) 108 (90 Base) MCG/ACT inhaler [id number] Yes Inhale 2 puffs into the lungs every 4 (four) hours as needed for shortness of breath. [name], NP  Active Spouse/Significant Other  Black Pepper-Turmeric (TURMERIC COMPLEX/BLACK PEPPER PO) [id number] Yes Take 1 tablet by mouth in the morning and at bedtime. [provider]  Active Spouse/Significant Other  Calcium Carb-Cholecalciferol (CALCIUM PLUS VITAMIN D3 PO) [id number] Yes Take 1 tablet by mouth in the morning and at bedtime. [provider]  Active Spouse/Significant Other  Coenzyme Q10 (COQ10) 200 MG CAPS [id number] Yes Take 200 mg  by mouth daily. [provider]  Active Spouse/Significant Other  cyclobenzaprine  (FLEXERIL ) 10 MG tablet [id number] Yes Take 1 tablet (10 mg total) by mouth 2 (two) times daily as needed for muscle spasms. Ruthe Cornet, DO  Active Spouse/Significant Other  docusate sodium  (COLACE) 100 MG capsule [id number] Yes Take 1 capsule (100 mg total) by mouth every 12 (twelve) hours.  Patient taking differently: Take 100 mg by mouth daily as needed for moderate constipation.   Mannie Fairy DASEN, DO  Active Spouse/Significant Other  ELIQUIS  5 MG TABS tablet [id number] Yes TAKE 1 TABLET BY MOUTH TWICE A DAY Heilingoetter, Cassandra L, PA-C  Active   esomeprazole (NEXIUM) 20 MG capsule [id number] Yes Take 20 mg by mouth in the morning and at bedtime. [provider]  Active Spouse/Significant Other  gabapentin  (NEURONTIN ) 300 MG capsule [id number] Yes Take 1 capsule (300 mg total) by mouth 2 (two) times daily. For back pain. Pickenpack-Cousar, Fannie SAILOR, NP  Active Spouse/Significant Other  heparin  lock flush 100 unit/mL [id number]   Sherrod Sherrod, MD  Active   HYDROcodone  bit-homatropine [facility]) 5-1.5 MG/5ML syrup [id number] Yes Take 5 mLs by mouth every 6 (six) hours as needed for cough. Pickenpack-Cousar, Fannie SAILOR, NP  Active   insulin  aspart (NOVOLOG  FLEXPEN) 100 UNIT/ML FlexPen [id number]  Inject 1-9 Units into the skin 3 (three) times daily with meals as needed for high blood sugar (while on chemotherapy).  Patient not taking: Reported on 10/24/2024   [provider]  Active   ipratropium-albuterol  (DUONEB) 0.5-2.5 (3) MG/3ML SOLN [id number] Yes Take 3 mLs by nebulization every 6 (six) hours as needed (SOB/Wheezing). [provider]  Active Spouse/Significant Other  KRILL OIL PO [id number] Yes Take 1 capsule by mouth daily. [provider]  Active Spouse/Significant Other           Med Note (CRUTHIS, CHLOE C   Mon Jan 04, 2024  9:54 AM)    Lancets MISC [id number] Yes  USE UP TO 3 TIMES DAILY AS DIRECTED Clark, Katherine K, NP  Active Spouse/Significant Other  lidocaine  (LIDODERM ) 5 % [id number] Yes Place 1 patch onto the skin daily. Remove & Discard patch within 12 hours or as directed by MD Pickenpack-Cousar, Fannie SAILOR, NP  Active Spouse/Significant Other  metFORMIN  (GLUCOPHAGE ) 1000 MG tablet [id number] Yes TAKE 1 TABLET (1,000 MG TOTAL) BY MOUTH 2 (TWO) TIMES DAILY WITH A MEAL. FOR DIABETES [name], NP  Active   mirtazapine  (REMERON ) 15 MG tablet [id number] Yes TAKE 1 TABLET BY MOUTH EVERYDAY AT BEDTIME Heilingoetter, Cassandra L, PA-C  Active Spouse/Significant Other  morphine  (MS CONTIN ) 30 MG 12 hr tablet [id number] Yes Take 1 tablet (30 mg total) by mouth every 8 (eight) hours as needed.  Patient taking differently: Take 30 mg by mouth every 6 (six) hours as needed for pain.   Pickenpack-Cousar, Fannie SAILOR, NP  Active Spouse/Significant Other  Multiple Vitamin (MULTI-VITAMINS) TABS [id number] Yes Take 1 tablet by mouth daily with breakfast. [provider]  Active Spouse/Significant Other  Naloxone  HCl 3 MG/0.1ML LIQD [id number] Yes Place 1 spray into both nostrils once. For known/suspected opiod overdose. Every 2-3 minutes in alternating nostril till EMS arrives. [provider]  Active Spouse/Significant Other           Med Note JACKOLYN WADDELL DEL   Fri Oct 21, 2024  1:56 PM)    ondansetron  (ZOFRAN ) 8 MG tablet [id number] Yes Take 1 tablet (8 mg total) by mouth every 8 (eight) hours as needed for nausea or vomiting. Heilingoetter, Cassandra L, PA-C  Active Spouse/Significant Other  oxyCODONE -acetaminophen  (PERCOCET) 10-325 MG tablet [id number] Yes Take 1-1.5 tablets by mouth every 4 (four) hours as needed for pain. Pickenpack-Cousar, Fannie SAILOR, NP  Active Spouse/Significant Other  pioglitazone  (ACTOS ) 45 MG tablet [id number] Yes TAKE 1 TABLET (45 MG TOTAL) BY MOUTH DAILY. FOR DIABETES. [name], NP  Active Spouse/Significant Other   polyethylene glycol powder (GLYCOLAX /MIRALAX ) 17 GM/SCOOP powder [id number] Yes Take 17 g by mouth daily as needed for moderate constipation or mild constipation. [provider]  Active Spouse/Significant Other  pravastatin  (PRAVACHOL ) 40 MG tablet [id number] Yes TAKE 1 TABLET BY MOUTH EVERY DAY IN THE EVENING FOR CHOLESTEROL Clark, Katherine K, NP  Active Spouse/Significant Other  PRESCRIPTION MEDICATION [id number] Yes CPAP- At bedtime [provider]  Active Spouse/Significant Other  sodium chloride  flush (NS) 0.9 % injection 10 mL [id number]   Sherrod Sherrod, MD  Active   testosterone  cypionate (DEPOTESTOSTERONE CYPIONATE) 200 MG/ML injection [id number] Yes Inject 200 mg into the muscle every 14 (fourteen) days. [provider]  Active Spouse/Significant Other            Home Care and Equipment/Supplies: Were Home Health Services Ordered?: NA Any new equipment or medical supplies ordered?: NA  Functional Questionnaire: Do you need assistance with bathing/showering or dressing?: Yes Do you need assistance with meal preparation?: Yes Do you need assistance with eating?: No Do you have difficulty maintaining continence: No Do you need assistance with getting out of bed/getting out of a chair/moving?: Yes (uses a walker in house, cane when he goes out) Do you have difficulty managing or taking your medications?: No  Follow up appointments reviewed: PCP Follow-up appointment confirmed?: No MD Provider Line Number:[phone] Given: Yes  Specialist Hospital Follow-up appointment confirmed?: Yes Date of Specialist follow-up appointment?: 10/25/24 Follow-Up Specialty Provider:: 1111 oncology Sherrod Sherrod   [id number] echo Cardiology, 1124 rad onc post tx call, 1125 [facility] urology Do you need transportation to your follow-up appointment?: No Do you understand care options if your condition(s) worsen?: Yes-patient verbalized understanding  SDOH  Interventions Today    Flowsheet Row Most Recent Value  SDOH Interventions   Food Insecurity Interventions Intervention Not Indicated  Housing Interventions Intervention Not Indicated  Transportation Interventions Intervention Not Indicated, Patient Resources (Friends/Family)  Utilities Interventions Intervention Not Indicated   Discussed and offered 30 day TOC program.  Patient  declined.  The patient has been provided with contact information for the care management team and has been advised to call with any health -related questions or concerns.  The patient verbalized understanding with current plan of care.  The patient is directed to their insurance card regarding availability of benefits coverage  Cathlean Headland BSN RN [facility] Health Care Management Coordinator [email or web address] Direct Dial: [phone]  Fax: [phone] Website: [email or web address]

## 2024-10-25 ENCOUNTER — Encounter (HOSPITAL_COMMUNITY): Payer: Self-pay | Admitting: Internal Medicine

## 2024-10-25 ENCOUNTER — Inpatient Hospital Stay

## 2024-10-25 ENCOUNTER — Emergency Department (HOSPITAL_COMMUNITY)

## 2024-10-25 ENCOUNTER — Inpatient Hospital Stay: Admitting: Internal Medicine

## 2024-10-25 ENCOUNTER — Other Ambulatory Visit: Payer: Self-pay

## 2024-10-25 ENCOUNTER — Inpatient Hospital Stay (HOSPITAL_COMMUNITY)
Admission: EM | Admit: 2024-10-25 | Discharge: 2024-10-27 | DRG: 054 | Disposition: A | Discharge status: Hospice | Attending: Internal Medicine | Admitting: Internal Medicine

## 2024-10-25 ENCOUNTER — Encounter: Payer: Self-pay | Admitting: Internal Medicine

## 2024-10-25 ENCOUNTER — Inpatient Hospital Stay: Admitting: Nurse Practitioner

## 2024-10-25 DIAGNOSIS — Z86718 Personal history of other venous thrombosis and embolism: Secondary | ICD-10-CM

## 2024-10-25 DIAGNOSIS — K831 Obstruction of bile duct: Secondary | ICD-10-CM | POA: Diagnosis present

## 2024-10-25 DIAGNOSIS — E7220 Disorder of urea cycle metabolism, unspecified: Secondary | ICD-10-CM | POA: Diagnosis present

## 2024-10-25 DIAGNOSIS — I517 Cardiomegaly: Secondary | ICD-10-CM | POA: Diagnosis not present

## 2024-10-25 DIAGNOSIS — Z825 Family history of asthma and other chronic lower respiratory diseases: Secondary | ICD-10-CM

## 2024-10-25 DIAGNOSIS — R531 Weakness: Secondary | ICD-10-CM | POA: Diagnosis present

## 2024-10-25 DIAGNOSIS — C7931 Secondary malignant neoplasm of brain: Secondary | ICD-10-CM | POA: Diagnosis present

## 2024-10-25 DIAGNOSIS — G893 Neoplasm related pain (acute) (chronic): Secondary | ICD-10-CM | POA: Diagnosis present

## 2024-10-25 DIAGNOSIS — W19XXXA Unspecified fall, initial encounter: Secondary | ICD-10-CM | POA: Diagnosis present

## 2024-10-25 DIAGNOSIS — D630 Anemia in neoplastic disease: Secondary | ICD-10-CM | POA: Diagnosis not present

## 2024-10-25 DIAGNOSIS — J449 Chronic obstructive pulmonary disease, unspecified: Secondary | ICD-10-CM | POA: Diagnosis present

## 2024-10-25 DIAGNOSIS — E291 Testicular hypofunction: Secondary | ICD-10-CM | POA: Diagnosis present

## 2024-10-25 DIAGNOSIS — C7970 Secondary malignant neoplasm of unspecified adrenal gland: Secondary | ICD-10-CM | POA: Diagnosis not present

## 2024-10-25 DIAGNOSIS — Z8249 Family history of ischemic heart disease and other diseases of the circulatory system: Secondary | ICD-10-CM

## 2024-10-25 DIAGNOSIS — S199XXA Unspecified injury of neck, initial encounter: Secondary | ICD-10-CM | POA: Diagnosis not present

## 2024-10-25 DIAGNOSIS — R410 Disorientation, unspecified: Principal | ICD-10-CM

## 2024-10-25 DIAGNOSIS — E278 Other specified disorders of adrenal gland: Secondary | ICD-10-CM | POA: Diagnosis present

## 2024-10-25 DIAGNOSIS — Z515 Encounter for palliative care: Secondary | ICD-10-CM

## 2024-10-25 DIAGNOSIS — Z6832 Body mass index (BMI) 32.0-32.9, adult: Secondary | ICD-10-CM | POA: Diagnosis not present

## 2024-10-25 DIAGNOSIS — Z794 Long term (current) use of insulin: Secondary | ICD-10-CM

## 2024-10-25 DIAGNOSIS — F419 Anxiety disorder, unspecified: Secondary | ICD-10-CM | POA: Diagnosis present

## 2024-10-25 DIAGNOSIS — Z7189 Other specified counseling: Secondary | ICD-10-CM | POA: Diagnosis not present

## 2024-10-25 DIAGNOSIS — E1169 Type 2 diabetes mellitus with other specified complication: Secondary | ICD-10-CM | POA: Diagnosis present

## 2024-10-25 DIAGNOSIS — Z96653 Presence of artificial knee joint, bilateral: Secondary | ICD-10-CM | POA: Diagnosis present

## 2024-10-25 DIAGNOSIS — R918 Other nonspecific abnormal finding of lung field: Secondary | ICD-10-CM | POA: Diagnosis not present

## 2024-10-25 DIAGNOSIS — K7682 Hepatic encephalopathy: Secondary | ICD-10-CM

## 2024-10-25 DIAGNOSIS — Z833 Family history of diabetes mellitus: Secondary | ICD-10-CM

## 2024-10-25 DIAGNOSIS — R296 Repeated falls: Secondary | ICD-10-CM | POA: Diagnosis present

## 2024-10-25 DIAGNOSIS — Z8261 Family history of arthritis: Secondary | ICD-10-CM

## 2024-10-25 DIAGNOSIS — I2510 Atherosclerotic heart disease of native coronary artery without angina pectoris: Secondary | ICD-10-CM | POA: Diagnosis present

## 2024-10-25 DIAGNOSIS — C3490 Malignant neoplasm of unspecified part of unspecified bronchus or lung: Secondary | ICD-10-CM | POA: Diagnosis not present

## 2024-10-25 DIAGNOSIS — C787 Secondary malignant neoplasm of liver and intrahepatic bile duct: Secondary | ICD-10-CM | POA: Diagnosis present

## 2024-10-25 DIAGNOSIS — M545 Low back pain, unspecified: Secondary | ICD-10-CM | POA: Diagnosis present

## 2024-10-25 DIAGNOSIS — C7951 Secondary malignant neoplasm of bone: Secondary | ICD-10-CM | POA: Diagnosis not present

## 2024-10-25 DIAGNOSIS — Z711 Person with feared health complaint in whom no diagnosis is made: Secondary | ICD-10-CM | POA: Diagnosis not present

## 2024-10-25 DIAGNOSIS — Z79890 Hormone replacement therapy (postmenopausal): Secondary | ICD-10-CM

## 2024-10-25 DIAGNOSIS — E7849 Other hyperlipidemia: Secondary | ICD-10-CM | POA: Diagnosis present

## 2024-10-25 DIAGNOSIS — J9611 Chronic respiratory failure with hypoxia: Secondary | ICD-10-CM | POA: Diagnosis present

## 2024-10-25 DIAGNOSIS — Z9981 Dependence on supplemental oxygen: Secondary | ICD-10-CM

## 2024-10-25 DIAGNOSIS — K7290 Hepatic failure, unspecified without coma: Secondary | ICD-10-CM | POA: Diagnosis not present

## 2024-10-25 DIAGNOSIS — Z789 Other specified health status: Secondary | ICD-10-CM | POA: Diagnosis not present

## 2024-10-25 DIAGNOSIS — Z86711 Personal history of pulmonary embolism: Secondary | ICD-10-CM

## 2024-10-25 DIAGNOSIS — C799 Secondary malignant neoplasm of unspecified site: Secondary | ICD-10-CM

## 2024-10-25 DIAGNOSIS — G8929 Other chronic pain: Secondary | ICD-10-CM | POA: Diagnosis present

## 2024-10-25 DIAGNOSIS — D649 Anemia, unspecified: Secondary | ICD-10-CM | POA: Diagnosis present

## 2024-10-25 DIAGNOSIS — Z85118 Personal history of other malignant neoplasm of bronchus and lung: Secondary | ICD-10-CM | POA: Diagnosis not present

## 2024-10-25 DIAGNOSIS — Z888 Allergy status to other drugs, medicaments and biological substances status: Secondary | ICD-10-CM

## 2024-10-25 DIAGNOSIS — C3411 Malignant neoplasm of upper lobe, right bronchus or lung: Secondary | ICD-10-CM | POA: Diagnosis present

## 2024-10-25 DIAGNOSIS — I10 Essential (primary) hypertension: Secondary | ICD-10-CM | POA: Diagnosis present

## 2024-10-25 DIAGNOSIS — E119 Type 2 diabetes mellitus without complications: Secondary | ICD-10-CM | POA: Diagnosis not present

## 2024-10-25 DIAGNOSIS — E785 Hyperlipidemia, unspecified: Secondary | ICD-10-CM | POA: Diagnosis not present

## 2024-10-25 DIAGNOSIS — R4182 Altered mental status, unspecified: Secondary | ICD-10-CM | POA: Diagnosis present

## 2024-10-25 DIAGNOSIS — E669 Obesity, unspecified: Secondary | ICD-10-CM | POA: Diagnosis present

## 2024-10-25 DIAGNOSIS — Z7901 Long term (current) use of anticoagulants: Secondary | ICD-10-CM

## 2024-10-25 DIAGNOSIS — K219 Gastro-esophageal reflux disease without esophagitis: Secondary | ICD-10-CM | POA: Diagnosis present

## 2024-10-25 DIAGNOSIS — R000 Tachycardia, unspecified: Secondary | ICD-10-CM | POA: Diagnosis not present

## 2024-10-25 DIAGNOSIS — R5383 Other fatigue: Secondary | ICD-10-CM | POA: Diagnosis not present

## 2024-10-25 DIAGNOSIS — Z66 Do not resuscitate: Secondary | ICD-10-CM | POA: Diagnosis present

## 2024-10-25 DIAGNOSIS — G9341 Metabolic encephalopathy: Secondary | ICD-10-CM | POA: Diagnosis present

## 2024-10-25 DIAGNOSIS — M5030 Other cervical disc degeneration, unspecified cervical region: Secondary | ICD-10-CM | POA: Diagnosis not present

## 2024-10-25 DIAGNOSIS — Z9221 Personal history of antineoplastic chemotherapy: Secondary | ICD-10-CM

## 2024-10-25 DIAGNOSIS — Z923 Personal history of irradiation: Secondary | ICD-10-CM

## 2024-10-25 DIAGNOSIS — Z79899 Other long term (current) drug therapy: Secondary | ICD-10-CM

## 2024-10-25 DIAGNOSIS — F32A Depression, unspecified: Secondary | ICD-10-CM | POA: Diagnosis present

## 2024-10-25 DIAGNOSIS — G4733 Obstructive sleep apnea (adult) (pediatric): Secondary | ICD-10-CM | POA: Diagnosis present

## 2024-10-25 DIAGNOSIS — M1990 Unspecified osteoarthritis, unspecified site: Secondary | ICD-10-CM | POA: Diagnosis present

## 2024-10-25 DIAGNOSIS — Z87891 Personal history of nicotine dependence: Secondary | ICD-10-CM

## 2024-10-25 DIAGNOSIS — R509 Fever, unspecified: Secondary | ICD-10-CM | POA: Diagnosis not present

## 2024-10-25 DIAGNOSIS — Z7984 Long term (current) use of oral hypoglycemic drugs: Secondary | ICD-10-CM

## 2024-10-25 DIAGNOSIS — M47812 Spondylosis without myelopathy or radiculopathy, cervical region: Secondary | ICD-10-CM | POA: Diagnosis not present

## 2024-10-25 DIAGNOSIS — R17 Unspecified jaundice: Secondary | ICD-10-CM

## 2024-10-25 DIAGNOSIS — Z7401 Bed confinement status: Secondary | ICD-10-CM | POA: Diagnosis not present

## 2024-10-25 LAB — URINALYSIS, ROUTINE W REFLEX MICROSCOPIC
Glucose, UA: 100 mg/dL — AB
Ketones, ur: 15 mg/dL — AB
Leukocytes,Ua: NEGATIVE
Nitrite: NEGATIVE
Protein, ur: 30 mg/dL — AB
Specific Gravity, Urine: 1.02 (ref 1.005–1.030)
pH: 5.5 (ref 5.0–8.0)

## 2024-10-25 LAB — I-STAT CHEM 8, ED
BUN: 20 mg/dL (ref 8–23)
Calcium, Ion: 1.08 mmol/L — ABNORMAL LOW (ref 1.15–1.40)
Chloride: 115 mmol/L — ABNORMAL HIGH (ref 98–111)
Creatinine, Ser: 0.8 mg/dL (ref 0.61–1.24)
Glucose, Bld: 176 mg/dL — ABNORMAL HIGH (ref 70–99)
HCT: 32 % — ABNORMAL LOW (ref 39.0–52.0)
Hemoglobin: 10.9 g/dL — ABNORMAL LOW (ref 13.0–17.0)
Potassium: 4.7 mmol/L (ref 3.5–5.1)
Sodium: 133 mmol/L — ABNORMAL LOW (ref 135–145)
TCO2: 30 mmol/L (ref 22–32)

## 2024-10-25 LAB — LIPASE, BLOOD: Lipase: 21 U/L (ref 11–51)

## 2024-10-25 LAB — URINALYSIS, MICROSCOPIC (REFLEX): Bacteria, UA: NONE SEEN

## 2024-10-25 LAB — CBC WITH DIFFERENTIAL/PLATELET
Abs Immature Granulocytes: 0.1 K/uL — ABNORMAL HIGH (ref 0.00–0.07)
Basophils Absolute: 0 K/uL (ref 0.0–0.1)
Basophils Relative: 0 %
Eosinophils Absolute: 0 K/uL (ref 0.0–0.5)
Eosinophils Relative: 0 %
HCT: 25.7 % — ABNORMAL LOW (ref 39.0–52.0)
Hemoglobin: 8 g/dL — ABNORMAL LOW (ref 13.0–17.0)
Immature Granulocytes: 1 %
Lymphocytes Relative: 1 %
Lymphs Abs: 0.2 K/uL — ABNORMAL LOW (ref 0.7–4.0)
MCH: 31.3 pg (ref 26.0–34.0)
MCHC: 31.1 g/dL (ref 30.0–36.0)
MCV: 100.4 fL — ABNORMAL HIGH (ref 80.0–100.0)
Monocytes Absolute: 0.8 K/uL (ref 0.1–1.0)
Monocytes Relative: 7 %
Neutro Abs: 9.5 K/uL — ABNORMAL HIGH (ref 1.7–7.7)
Neutrophils Relative %: 91 %
Platelets: 123 K/uL — ABNORMAL LOW (ref 150–400)
RBC: 2.56 MIL/uL — ABNORMAL LOW (ref 4.22–5.81)
RDW: 21.9 % — ABNORMAL HIGH (ref 11.5–15.5)
WBC: 10.5 K/uL (ref 4.0–10.5)
nRBC: 0.2 % (ref 0.0–0.2)

## 2024-10-25 LAB — COMPREHENSIVE METABOLIC PANEL WITH GFR
ALT: 130 U/L — ABNORMAL HIGH (ref 0–44)
AST: 202 U/L — ABNORMAL HIGH (ref 15–41)
Albumin: 1.9 g/dL — ABNORMAL LOW (ref 3.5–5.0)
Alkaline Phosphatase: 854 U/L — ABNORMAL HIGH (ref 38–126)
Anion gap: 15 (ref 5–15)
BUN: 15 mg/dL (ref 8–23)
CO2: 24 mmol/L (ref 22–32)
Calcium: 8.2 mg/dL — ABNORMAL LOW (ref 8.9–10.3)
Chloride: 92 mmol/L — ABNORMAL LOW (ref 98–111)
Creatinine, Ser: 0.78 mg/dL (ref 0.61–1.24)
GFR, Estimated: >60 mL/min (ref >60)
Glucose, Bld: 175 mg/dL — ABNORMAL HIGH (ref 70–99)
Potassium: 3.6 mmol/L (ref 3.5–5.1)
Sodium: 131 mmol/L — ABNORMAL LOW (ref 135–145)
Total Bilirubin: 15.8 mg/dL — ABNORMAL HIGH (ref 0.0–1.2)
Total Protein: 5 g/dL — ABNORMAL LOW (ref 6.5–8.1)

## 2024-10-25 LAB — APTT: aPTT: 75 s — ABNORMAL HIGH (ref 24–36)

## 2024-10-25 LAB — PROTIME-INR
INR: 2.4 — ABNORMAL HIGH (ref 0.8–1.2)
Prothrombin Time: 27.7 s — ABNORMAL HIGH (ref 11.4–15.2)

## 2024-10-25 LAB — AMMONIA: Ammonia: 56 umol/L — ABNORMAL HIGH (ref 9–35)

## 2024-10-25 LAB — CK: Total CK: 64 U/L (ref 49–397)

## 2024-10-25 MED ORDER — LACTULOSE 10 GM/15ML PO SOLN: 20.0000 g | Freq: Two times a day (BID) | ORAL | Status: DC

## 2024-10-25 MED ORDER — LACTULOSE ENEMA
300.0000 mL | Freq: Two times a day (BID) | ORAL | Status: DC
  Administered 2024-10-25: 300 mL via RECTAL
  Filled 2024-10-25 (×5): qty 300

## 2024-10-25 MED ORDER — APIXABAN 5 MG PO TABS
5.0000 mg | ORAL_TABLET | Freq: Two times a day (BID) | ORAL | Status: DC
  Administered 2024-10-25 – 2024-10-27 (×3): 5 mg via ORAL
  Filled 2024-10-25 (×4): qty 1

## 2024-10-25 MED ORDER — SODIUM CHLORIDE 0.9% FLUSH
3.0000 mL | Freq: Two times a day (BID) | INTRAVENOUS | Status: DC
  Administered 2024-10-25 – 2024-10-27 (×3): 3 mL via INTRAVENOUS

## 2024-10-25 MED ORDER — SODIUM CHLORIDE 0.9% FLUSH
10.0000 mL | Freq: Two times a day (BID) | INTRAVENOUS | Status: DC
  Administered 2024-10-25 – 2024-10-27 (×4): 10 mL

## 2024-10-25 MED ORDER — OXYCODONE HCL 5 MG PO TABS
10.0000 mg | ORAL_TABLET | ORAL | Status: DC | PRN
Start: 2024-10-25 — End: 2024-10-27

## 2024-10-25 MED ORDER — IPRATROPIUM-ALBUTEROL 0.5-2.5 (3) MG/3ML IN SOLN
3.0000 mL | Freq: Once | RESPIRATORY_TRACT | Status: AC
  Administered 2024-10-25: 3 mL via RESPIRATORY_TRACT
  Filled 2024-10-25: qty 3

## 2024-10-25 MED ORDER — CHLORHEXIDINE GLUCONATE CLOTH 2 % EX PADS
6.0000 | MEDICATED_PAD | Freq: Every day | CUTANEOUS | Status: DC
  Administered 2024-10-25 – 2024-10-27 (×3): 6 via TOPICAL

## 2024-10-25 MED ORDER — IOHEXOL 350 MG/ML SOLN
75.0000 mL | Freq: Once | INTRAVENOUS | Status: AC | PRN
  Administered 2024-10-25: 75 mL via INTRAVENOUS

## 2024-10-25 MED ORDER — ACETAMINOPHEN 325 MG PO TABS
650.0000 mg | ORAL_TABLET | Freq: Four times a day (QID) | ORAL | Status: DC | PRN
  Filled 2024-10-25: qty 2

## 2024-10-25 MED ORDER — GABAPENTIN 300 MG PO CAPS
300.0000 mg | ORAL_CAPSULE | Freq: Two times a day (BID) | ORAL | Status: DC
  Administered 2024-10-25 – 2024-10-27 (×3): 300 mg via ORAL
  Filled 2024-10-25 (×4): qty 1

## 2024-10-25 MED ORDER — HYDROMORPHONE HCL 1 MG/ML IJ SOLN: 0.5000 mg | INTRAMUSCULAR | Status: DC | PRN

## 2024-10-25 MED ORDER — ACETAMINOPHEN 650 MG RE SUPP
650.0000 mg | Freq: Four times a day (QID) | RECTAL | Status: DC | PRN
  Administered 2024-10-26: 650 mg via RECTAL
  Filled 2024-10-25: qty 1

## 2024-10-25 MED ORDER — SODIUM CHLORIDE 0.9% FLUSH: 10.0000 mL | INTRAVENOUS | Status: DC | PRN

## 2024-10-25 MED ORDER — SENNOSIDES-DOCUSATE SODIUM 8.6-50 MG PO TABS: 1.0000 | ORAL_TABLET | Freq: Every evening | ORAL | Status: DC | PRN

## 2024-10-25 MED ORDER — MIRTAZAPINE 15 MG PO TABS
15.0000 mg | ORAL_TABLET | Freq: Every day | ORAL | Status: DC
  Administered 2024-10-25: 15 mg via ORAL
  Filled 2024-10-25 (×2): qty 1

## 2024-10-25 MED ORDER — PANTOPRAZOLE SODIUM 40 MG PO TBEC
40.0000 mg | DELAYED_RELEASE_TABLET | Freq: Two times a day (BID) | ORAL | Status: DC
  Administered 2024-10-25 – 2024-10-27 (×3): 40 mg via ORAL
  Filled 2024-10-25 (×4): qty 1

## 2024-10-25 MED ORDER — MORPHINE SULFATE ER 15 MG PO TBCR
30.0000 mg | EXTENDED_RELEASE_TABLET | Freq: Two times a day (BID) | ORAL | Status: DC
  Administered 2024-10-25 – 2024-10-27 (×3): 30 mg via ORAL
  Filled 2024-10-25 (×4): qty 2

## 2024-10-25 MED ORDER — IPRATROPIUM-ALBUTEROL 0.5-2.5 (3) MG/3ML IN SOLN: 3.0000 mL | Freq: Four times a day (QID) | RESPIRATORY_TRACT | Status: DC | PRN

## 2024-10-25 NOTE — ED Provider Notes (Signed)
 Patient admitted to Dr Seena hospitalist, DNR status   Cottie Donnice PARAS, MD 10/25/24 [phone]

## 2024-10-25 NOTE — ED Triage Notes (Signed)
 Patient presents to the ER via EMS from home for a fall. Patient had a fall in the middle of the night. Patient denies LOC. Patient was unable to get up. Wife found patient this morning when she woke. Unknown downtime. Patient c/o back pain that is not new. 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 Patient reports that he is on Chemo and missed his last treatment due to dehydration. His last treatment was 10/31.

## 2024-10-25 NOTE — Progress Notes (Signed)
 [facility] [address] [facility] Liaison Note:   This is a current patient with [facility], followed for home health PT and SN.   Liaisons will continue to follow for discharge disposition.   Please call with any questions or concerns.   Thank you, Eleanor Nail, [facility] liaison  [phone]

## 2024-10-25 NOTE — Progress Notes (Signed)
 Orthopedic Tech Progress Note Patient Details:  Adam Wise 1951-03-12 [id number]  Level 2 trauma   Patient ID: Adam Wise, male   DOB: 12/25/1950, 73 y.o.   MRN: [id number]  Adam Wise Pac 10/25/2024, 10:10 AM

## 2024-10-25 NOTE — Progress Notes (Signed)
 TOC/ICM consulted for home hospice. Pt is active Psychologist, Educational HH RN, PT. [facility] liaison notified via secure chat, referral for home hospice placed by provider.

## 2024-10-25 NOTE — ED Notes (Signed)
 Patient cleaned of urine incontinence. Patient bed linens, hospital gown, and brief changed.

## 2024-10-25 NOTE — H&P (Signed)
 History and Physical   Adam Wise FMW:[id number] DOB: 1951/05/18 DOA: 10/25/2024  PCP: [name], NP   Patient coming from: Home  Chief Complaint: Falls, altered mental status  HPI: Adam Wise is a 73 y.o. male with medical history significant of hypertension, hyperlipidemia, diabetes, GERD, anemia, lung cancer with mets to liver/brain/other status post chemo, radiation, immunotherapy, OSA, PE, COPD, chronic respiratory failure presenting after falls at home.  Patient had 2 falls yesterday and hit his head on the second 1 with loss of consciousness.  He is on Eliquis  due to history of PE.  Unable to get up after the fall so he stayed on the floor for the rest the night.  Back pain afterwards.  Has had what weakness and falls since recent admission 10/19/10/7 for worsening LFT elevations.  During that admission he was evaluated with CT scan which showed no acute normality other than known metastatic disease and it is believed that his increasing bilirubin was secondary to cholestasis in the setting of malignancy.  Conservative management was recommended.  Patient has chronic pain. Somewhat confused but denies. Denies fevers, chills, constipation, diarrhea, nausea, vomiting.  ED Course: Vital signs in the ED notable for heart rate in the 110s-130s, blood pressure in the 100s-130 systolic, respirate in the 20s.  Lab workup included CMP with sodium 131, chloride 92, glucose 175, calcium 8.2, protein 5.0, albumin 1.9, AST 202, ALT 130, alk phos 854, T. bili 15.8.  CBC with hemoglobin stable at 8.0, platelets 123.  PT elevated at 27.7, INR elevated at 2.4, PTT elevated 75.  CK normal.  Lipase normal.  Ammonia level 56.  Urinalysis pending.  Trauma imaging showed no acute abnormality which included chest x-ray, CT head, CT C-spine, CT L-spine, CT T-spine, CT chest abdomen and pelvis.  Able findings: CT head showed known metastatic disease, CT L-spine showed chronic degenerative disease,  CT chest abdomen pelvis showed stable consolidation at the [facility] hilum, lymphadenopathy, liver masses, adrenal nodule, portacaval and celiac lymphadenopathy and minimal portal vein compression.  Patient received DuoNeb and lactulose in the ED.  Prolonged discussions held with family by EDP given his disease progression and prognosis.  They have opted to be DNR/DNI and to begin process of hospice care.  Unable to arrange hospice care in a timely manner today and so patient is to be observed overnight transitioning to hospice but not yet full comfort care.  Will continue with medications that alleviate symptoms and make patient more comfortable but without full comfort measures.  Review of Systems: As per HPI otherwise all other systems reviewed and are negative.  Past Medical History:  Diagnosis Date   Acute on chronic respiratory failure with hypoxia (HCC) 10/12/2021   Anxiety    Arthritis    Chickenpox    Chronic knee pain    Chronic low back pain    COPD exacerbation (HCC) 10/11/2021   COPD with exacerbation (HCC) 10/12/2021   Coronary artery calcification seen on CAT scan 11/2021   Coronary CTA 11/27/2021: Moderate to large right pleural effusion and compressive atelectasis right lung base. => Coronary Calcium Score 657.  Diffuse RCA calcification.  Minimal mild disease in the LAD and diagonal branches. == Overall limited study.  Notable artifact.   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 He has  disease progression after the first line of chemotherapy in December 2021.   Testosterone  deficiency    Type 2 diabetes mellitus (HCC)     Past Surgical History:  Procedure Laterality Date   CHEST TUBE INSERTION Right 01/20/2022   Procedure: CHEST TUBE INSERTION;  Surgeon: Claudene Toribio BROCKS, MD;  Location: [facility] ENDOSCOPY;  Service: Pulmonary;  Laterality: Right;  w/ Talc  Pleurodesis, planned admit for Obs afterwards   CHEST TUBE INSERTION Right 04/28/2022   Procedure: INSERTION PLEURAL DRAINAGE CATHETER - Pigtail tail, drainage;  Surgeon: Claudene Toribio BROCKS, MD;  Location: [facility] ENDOSCOPY;  Service: Pulmonary;  Laterality: Right;  talc  pleurodesis   CHEST TUBE INSERTION Right 09/01/2022   Procedure: INSERTION PLEURAL DRAINAGE CATHETER;  Surgeon: Alaine Vicenta NOVAK, MD;  Location: [facility] ENDOSCOPY;  Service: Cardiopulmonary;  Laterality: Right;  pigtail catheter placement with talc  pleurodesis   COLONOSCOPY WITH PROPOFOL  N/A 12/17/2018   Procedure: COLONOSCOPY WITH PROPOFOL ;  Surgeon: Janalyn Keene NOVAK, MD;  Location: [facility] ENDOSCOPY;  Service: Gastroenterology;  Laterality: N/A;   IR IMAGING GUIDED PORT INSERTION  04/17/2020   IR IVC FILTER RETRIEVAL / S&I /IMG GUID/MOD SED  09/19/2024   IR IVC FILTER RETRIEVAL / S&I PORTER GUID/MOD SED  09/19/2024   IR THORACENTESIS ASP PLEURAL SPACE W/IMG GUIDE  11/29/2021   IR THORACENTESIS ASP PLEURAL SPACE W/IMG GUIDE  01/03/2022   JOINT REPLACEMENT Bilateral    RADIOLOGY WITH ANESTHESIA N/A 09/19/2024   Procedure: RADIOLOGY WITH ANESTHESIA;  Surgeon: Radiologist, Medication, MD;  Location: [facility] ORS;  Service: Radiology;  Laterality: N/A;   REPLACEMENT TOTAL KNEE BILATERAL  2015   TALC  PLEURODESIS  09/01/2022   Procedure: TALC  PLEURADESIS;  Surgeon: Alaine Vicenta NOVAK, MD;  Location: [facility] ENDOSCOPY;  Service: Cardiopulmonary;;   TONSILLECTOMY  1960   TRANSTHORACIC ECHOCARDIOGRAM  05/2020   a) 05/2020: EF 55 to 60%.  No R WMA.  Mild LVH.  Indeterminate LVEDP.  Unable to assess  RVP.  Normal aortic and mitral valves.  Mildly elevated RAP.; b) 09/2021: EF 50-55%. No RWMA. Mild LVH. ~ LVEDP. Mild LA Dilation. NORMAL RV/RAP.  Normal MV/AoV.    Social History  reports that he has quit smoking. He has never been exposed to tobacco smoke. He has never used smokeless tobacco. He reports that he does not currently use alcohol. He reports that he does not currently use drugs.  Allergies  Allergen Reactions   Bupropion Other (See Comments) and Dermatitis    Racing heart  palpitations  Racing heart  palpitations Racing heart palpitations Racing heart    palpitations    Racing heart    palpitations Racing heart palpitations Racing heart    palpitations, Racing heart, palpitations, Racing heart   Hydrochlorothiazide Other (See Comments)    Cramping to lower extremities   Lisinopril      Cough     Family History  Problem Relation Age of Onset   Heart attack Mother    Cancer Mother    Hypertension Mother    Arthritis Father    Asthma Father    Cancer Father    COPD Father    Heart attack Father    Hypertension Sister    Cancer Sister    Diabetes Sister  Asthma Sister    Cancer Sister    COPD Sister    Arthritis Sister    Asthma Sister    Diabetes Sister    Birth defects Maternal Grandfather    Arthritis Paternal Grandmother    Diabetes Paternal Grandmother    Arthritis Paternal Grandfather    Asthma Son    Other Neg Hx        pituitary disorder  Reviewed on admission  Prior to Admission medications   Medication Sig Start Date End Date Taking? Authorizing Provider  albuterol  (VENTOLIN  HFA) 108 (90 Base) MCG/ACT inhaler Inhale 2 puffs into the lungs every 4 (four) hours as needed for shortness of breath. 05/30/24   Clark, Katherine K, NP  Black Pepper-Turmeric (TURMERIC COMPLEX/BLACK PEPPER PO) Take 1 tablet by mouth in the morning and at bedtime.    [provider]  Calcium Carb-Cholecalciferol (CALCIUM PLUS VITAMIN D3 PO) Take 1 tablet by  mouth in the morning and at bedtime.    [provider]  Coenzyme Q10 (COQ10) 200 MG CAPS Take 200 mg by mouth daily.    [provider]  cyclobenzaprine  (FLEXERIL ) 10 MG tablet Take 1 tablet (10 mg total) by mouth 2 (two) times daily as needed for muscle spasms. 07/29/24   Curatolo, Adam, DO  docusate sodium  (COLACE) 100 MG capsule Take 1 capsule (100 mg total) by mouth every 12 (twelve) hours. Patient taking differently: Take 100 mg by mouth daily as needed for moderate constipation. 08/02/23   Mannie Fairy DASEN, DO  ELIQUIS  5 MG TABS tablet TAKE 1 TABLET BY MOUTH TWICE A DAY 10/11/24   Heilingoetter, Cassandra L, PA-C  esomeprazole (NEXIUM) 20 MG capsule Take 20 mg by mouth in the morning and at bedtime.    [provider]  gabapentin  (NEURONTIN ) 300 MG capsule Take 1 capsule (300 mg total) by mouth 2 (two) times daily. For back pain. 08/10/24   Pickenpack-Cousar, Fannie SAILOR, NP  HYDROcodone  bit-homatropine (HYCODAN) 5-1.5 MG/5ML syrup Take 5 mLs by mouth every 6 (six) hours as needed for cough. 10/19/24   Pickenpack-Cousar, Athena N, NP  insulin  aspart (NOVOLOG  FLEXPEN) 100 UNIT/ML FlexPen Inject 1-9 Units into the skin 3 (three) times daily with meals as needed for high blood sugar (while on chemotherapy). Patient not taking: Reported on 10/24/2024    [provider]  ipratropium-albuterol  (DUONEB) 0.5-2.5 (3) MG/3ML SOLN Take 3 mLs by nebulization every 6 (six) hours as needed (SOB/Wheezing).    [provider]  KRILL OIL PO Take 1 capsule by mouth daily.    [provider]  lidocaine  (LIDODERM ) 5 % Place 1 patch onto the skin daily. Remove & Discard patch within 12 hours or as directed by MD 09/06/24   Pickenpack-Cousar, Fannie SAILOR, NP  metFORMIN  (GLUCOPHAGE ) 1000 MG tablet TAKE 1 TABLET (1,000 MG TOTAL) BY MOUTH 2 (TWO) TIMES DAILY WITH A MEAL. FOR DIABETES 10/11/24   Clark, Katherine K, NP  mirtazapine  (REMERON ) 15 MG tablet TAKE 1 TABLET BY MOUTH  EVERYDAY AT BEDTIME 02/28/24   Heilingoetter, Cassandra L, PA-C  morphine  (MS CONTIN ) 30 MG 12 hr tablet Take 1 tablet (30 mg total) by mouth every 8 (eight) hours as needed. Patient taking differently: Take 30 mg by mouth every 6 (six) hours as needed for pain. 09/09/24   Pickenpack-Cousar, Athena N, NP  Multiple Vitamin (MULTI-VITAMINS) TABS Take 1 tablet by mouth daily with breakfast.    [provider]  Naloxone  HCl 3 MG/0.1ML LIQD Place 1 spray  into both nostrils once. For known/suspected opiod overdose. Every 2-3 minutes in alternating nostril till EMS arrives. 10/06/23   [provider]  ondansetron  (ZOFRAN ) 8 MG tablet Take 1 tablet (8 mg total) by mouth every 8 (eight) hours as needed for nausea or vomiting. 07/11/24   Heilingoetter, Cassandra L, PA-C  oxyCODONE -acetaminophen  (PERCOCET) 10-325 MG tablet Take 1-1.5 tablets by mouth every 4 (four) hours as needed for pain. 09/06/24   Pickenpack-Cousar, Athena N, NP  pioglitazone  (ACTOS ) 45 MG tablet TAKE 1 TABLET (45 MG TOTAL) BY MOUTH DAILY. FOR DIABETES. 08/17/24   Clark, Katherine K, NP  polyethylene glycol powder (GLYCOLAX /MIRALAX ) 17 GM/SCOOP powder Take 17 g by mouth daily as needed for moderate constipation or mild constipation.    [provider]  pravastatin  (PRAVACHOL ) 40 MG tablet TAKE 1 TABLET BY MOUTH EVERY DAY IN THE EVENING FOR CHOLESTEROL 08/17/24   Clark, Katherine K, NP  PRESCRIPTION MEDICATION CPAP- At bedtime    [provider]  testosterone  cypionate (DEPOTESTOSTERONE CYPIONATE) 200 MG/ML injection Inject 200 mg into the muscle every 14 (fourteen) days.    [provider]    Physical Exam: Vitals:   10/25/24 1045 10/25/24 1115 10/25/24 1406 10/25/24 1548  BP: 132/79 (!) 125/111 (!) 130/90 (!) 137/92  Pulse: (!) 112 (!) 135 (!) 120 (!) 134  Resp: (!) 23 (!) 25 19 (!) 29  Temp:   99 F (37.2 C) 99.9 F (37.7 C)  TempSrc:   Oral Axillary  SpO2: 94% 95% 94% 98%  Weight:       Height:        Physical Exam Constitutional:      General: He is not in acute distress.    Appearance: Normal appearance.  HENT:     Head: Normocephalic and atraumatic.     Mouth/Throat:     Mouth: Mucous membranes are moist.     Pharynx: Oropharynx is clear.  Eyes:     General: Scleral icterus present.     Extraocular Movements: Extraocular movements intact.     Pupils: Pupils are equal, round, and reactive to light.  Cardiovascular:     Rate and Rhythm: Regular rhythm. Tachycardia present.     Pulses: Normal pulses.     Heart sounds: Normal heart sounds.  Pulmonary:     Effort: Pulmonary effort is normal. No respiratory distress.     Breath sounds: Rhonchi present.  Abdominal:     General: Bowel sounds are normal. There is no distension.     Palpations: Abdomen is soft.     Tenderness: There is no abdominal tenderness.  Musculoskeletal:        General: No swelling or deformity.  Skin:    General: Skin is warm and dry.     Coloration: Skin is jaundiced.  Neurological:     General: No focal deficit present.     Mental Status: Mental status is at baseline. He is disoriented.    Labs on Admission: I have personally reviewed following labs and imaging studies  CBC: Recent Labs  Lab 10/19/24 1514 10/20/24 0344 10/21/24 0334 10/25/24 0914 10/25/24 0932  WBC 5.2 6.0 6.3  --  10.5  NEUTROABS 4.4  --  5.0  --  9.5*  HGB 7.2* 7.5* 7.4* 10.9* 8.0*  HCT 23.9* 24.6* 24.2* 32.0* 25.7*  MCV 101.7* 101.2* 101.7*  --  100.4*  PLT 127* 136* 142*  --  123*    Basic Metabolic Panel: Recent Labs  Lab 10/19/24 1043 10/20/24  9655 10/21/24 0334 10/25/24 0914 10/25/24 0932  NA 135 135 135 133* 131*  K 3.6 3.4* 3.8 4.7 3.6  CL 95* 96* 97* 115* 92*  CO2 30 28 29   --  24  GLUCOSE 178* 135* 134* 176* 175*  BUN 12 11 9 20 15   CREATININE 0.73 0.69 0.70 0.80 0.78  CALCIUM 8.5* 8.4* 8.3*  --  8.2*    GFR: Estimated Creatinine Clearance: 94.3 mL/min (by C-G formula based on  SCr of 0.78 mg/dL).  Liver Function Tests: Recent Labs  Lab 10/19/24 1043 10/20/24 0344 10/21/24 0334 10/25/24 0932  AST 110* 124* 136* 202*  ALT 110* 112* 110* 130*  ALKPHOS 604* 643* 759* 854*  BILITOT 5.9* 5.4* 6.2* 15.8*  PROT 5.7* 5.2* 5.1* 5.0*  ALBUMIN 2.9* 2.8* 2.7* 1.9*    Urine analysis:    Component Value Date/Time   COLORURINE AMBER (A) 10/19/2024 1440   APPEARANCEUR CLEAR 10/19/2024 1440   LABSPEC 1.030 10/19/2024 1440   PHURINE 5.0 10/19/2024 1440   GLUCOSEU 50 (A) 10/19/2024 1440   GLUCOSEU 100 (A) 11/25/2017 1359   HGBUR NEGATIVE 10/19/2024 1440   BILIRUBINUR MODERATE (A) 10/19/2024 1440   BILIRUBINUR neg 01/01/2024 1358   KETONESUR 5 (A) 10/19/2024 1440   PROTEINUR 30 (A) 10/19/2024 1440   UROBILINOGEN 1.0 01/01/2024 1358   UROBILINOGEN 0.2 11/25/2017 1359   NITRITE NEGATIVE 10/19/2024 1440   LEUKOCYTESUR NEGATIVE 10/19/2024 1440    Radiological Exams on Admission: CT L-SPINE NO CHARGE Result Date: 10/25/2024 EXAM: CT THORACIC AND LUMBAR SPINE WITH INTRAVENOUS CONTRAST 10/25/2024 10:22:54 AM TECHNIQUE: CT of the thoracic and lumbar spine was performed with the administration of intravenous contrast. Multiplanar reformatted images are provided for review. Automated exposure control, iterative reconstruction, and/or weight based adjustment of the mA/kV was utilized to reduce the radiation dose to as low as reasonably achievable. Incidental adrenal and/or renal findings do not require follow up imaging. COMPARISON: CT abdomen and pelvis 10/19/2024 and MRI lumbar spine 08/13/2024. CLINICAL HISTORY: Blunt trauma. FINDINGS: THORACIC SPINE: BONES AND ALIGNMENT: Normal vertebral body heights aside from mild chronic anterior wedging of the T11 and T12 vertebral bodies. No acute fracture or suspicious bone lesion. Normal alignment. Spinal cord stimulator lead terminates at the T9 level. DEGENERATIVE CHANGES: Multilevel intervertebral disc height loss throughout the  thoracic spine. Multilevel degenerative endplate Schmorl's nodes. Moderate left T9-T10 neural foraminal stenosis. Otherwise, no high-grade thoracic spinal stenosis or other high-grade thoracic neural foraminal narrowing by CT. SOFT TISSUES: Spinal cord stimulator hardware. LUMBAR SPINE: BONES AND ALIGNMENT: Normal vertebral body heights. Chronic bilateral L5 pars interarticularis defects. Demineralization/ lucency of the partially visualized inferior S1 and S2 sacral vertebrae. No acute fracture or suspicious bone lesion. Normal alignment. DEGENERATIVE CHANGES: Multilevel intervertebral disc height loss throughout the lumbar spine. Multilevel degenerative endplate Schmorl's nodes. Multilevel disc bulges and facet arthropathy with up to moderate/ severe spinal stenosis most pronounced at L4-L5 and L3-L4. Up to moderate neuroforaminal stenosis most pronounced at the bilateral L3-L4 level and moderate-to-severe at the bilateral L4-L5 level. Vacuum disc phenomenon at L3-L4 with gas in the superior aspect of the right L3-L4 neural foramen. SOFT TISSUES: Right pulmonary consolidation bilateral pleural effusions right renal hypodensities, atherosclerotic aortoiliac arteries right internal jugular central venous catheter, small pericardial effusion; Please see separately dictated report for concurrently performed CT chest, abdomen, and pelvis for further evaluation. IMPRESSION: 1. No acute fracture or traumatic malalignment of the thoracic or lumbar spine. 2. Multilevel degenerative changes of the thoracolumbar spine with up  to moderate/ severe spinal stenosis at L3-L4 and L4-L5. Severe neural foraminal stenosis at the bilateral L4-L5 and moderate neural foraminal stenosis at the bilateral L3-L4 levels. Findings are seen to better advantage on MRI of the lumbar spine 08/13/2024. Electronically signed by: prentice bybordi 10/25/2024 12:00 PM EST RP Workstation: [id number]   CT T-SPINE NO CHARGE Result Date:  10/25/2024 EXAM: CT THORACIC AND LUMBAR SPINE WITH INTRAVENOUS CONTRAST 10/25/2024 10:22:54 AM TECHNIQUE: CT of the thoracic and lumbar spine was performed with the administration of intravenous contrast. Multiplanar reformatted images are provided for review. Automated exposure control, iterative reconstruction, and/or weight based adjustment of the mA/kV was utilized to reduce the radiation dose to as low as reasonably achievable. Incidental adrenal and/or renal findings do not require follow up imaging. COMPARISON: CT abdomen and pelvis 10/19/2024 and MRI lumbar spine 08/13/2024. CLINICAL HISTORY: Blunt trauma. FINDINGS: THORACIC SPINE: BONES AND ALIGNMENT: Normal vertebral body heights aside from mild chronic anterior wedging of the T11 and T12 vertebral bodies. No acute fracture or suspicious bone lesion. Normal alignment. Spinal cord stimulator lead terminates at the T9 level. DEGENERATIVE CHANGES: Multilevel intervertebral disc height loss throughout the thoracic spine. Multilevel degenerative endplate Schmorl's nodes. Moderate left T9-T10 neural foraminal stenosis. Otherwise, no high-grade thoracic spinal stenosis or other high-grade thoracic neural foraminal narrowing by CT. SOFT TISSUES: Spinal cord stimulator hardware. LUMBAR SPINE: BONES AND ALIGNMENT: Normal vertebral body heights. Chronic bilateral L5 pars interarticularis defects. Demineralization/ lucency of the partially visualized inferior S1 and S2 sacral vertebrae. No acute fracture or suspicious bone lesion. Normal alignment. DEGENERATIVE CHANGES: Multilevel intervertebral disc height loss throughout the lumbar spine. Multilevel degenerative endplate Schmorl's nodes. Multilevel disc bulges and facet arthropathy with up to moderate/ severe spinal stenosis most pronounced at L4-L5 and L3-L4. Up to moderate neuroforaminal stenosis most pronounced at the bilateral L3-L4 level and moderate-to-severe at the bilateral L4-L5 level. Vacuum disc  phenomenon at L3-L4 with gas in the superior aspect of the right L3-L4 neural foramen. SOFT TISSUES: Right pulmonary consolidation bilateral pleural effusions right renal hypodensities, atherosclerotic aortoiliac arteries right internal jugular central venous catheter, small pericardial effusion; Please see separately dictated report for concurrently performed CT chest, abdomen, and pelvis for further evaluation. IMPRESSION: 1. No acute fracture or traumatic malalignment of the thoracic or lumbar spine. 2. Multilevel degenerative changes of the thoracolumbar spine with up to moderate/ severe spinal stenosis at L3-L4 and L4-L5. Severe neural foraminal stenosis at the bilateral L4-L5 and moderate neural foraminal stenosis at the bilateral L3-L4 levels. Findings are seen to better advantage on MRI of the lumbar spine 08/13/2024. Electronically signed by: prentice bybordi 10/25/2024 12:00 PM EST RP Workstation: [id number]   CT Cervical Spine Wo Contrast Result Date: 10/25/2024 CLINICAL DATA:  Blunt trauma.  Anticoagulation.  Jaundice. EXAM: CT CERVICAL SPINE WITHOUT CONTRAST TECHNIQUE: Multidetector CT imaging of the cervical spine was performed without intravenous contrast. Multiplanar CT image reconstructions were also generated. RADIATION DOSE REDUCTION: This exam was performed according to the departmental dose-optimization program which includes automated exposure control, adjustment of the mA and/or kV according to patient size and/or use of iterative reconstruction technique. COMPARISON:  10/20/2024 CT cervical spine FINDINGS: Alignment: 2 mm grade 1 degenerative anterolisthesis at C7-T1 is unchanged. Skull base and vertebrae: No fracture or acute bony findings. Loss of intervertebral disc height at all levels between C3 and T1. Soft tissues and spinal canal: Bilateral common carotid atheromatous vascular calcifications. Disc levels: Multilevel intervertebral, uncinate, and facet spurring, resulting in at least  moderate left  foraminal stenosis at the C3-4 level. Upper chest: Consolidation or pleural fluid at the right lung apex. See dedicated chest CT. Other: Right central line partially observed. IMPRESSION: 1. No acute cervical spine findings. 2. Multilevel cervical spondylosis and degenerative disc disease, causing at least moderate left foraminal stenosis at C3-4. 3. Consolidation or pleural fluid at the right lung apex. See dedicated chest CT. Electronically Signed   By: Ryan Salvage M.D.   On: 10/25/2024 11:05   CT CHEST ABDOMEN PELVIS W CONTRAST Result Date: 10/25/2024 CLINICAL DATA:  Fall today with posterior rib pain bilaterally. Known metastatic small cell lung cancer. EXAM: CT CHEST, ABDOMEN, AND PELVIS WITH CONTRAST TECHNIQUE: Multidetector CT imaging of the chest, abdomen and pelvis was performed following the standard protocol during bolus administration of intravenous contrast. RADIATION DOSE REDUCTION: This exam was performed according to the departmental dose-optimization program which includes automated exposure control, adjustment of the mA and/or kV according to patient size and/or use of iterative reconstruction technique. CONTRAST:  75mL OMNIPAQUE  IOHEXOL  350 MG/ML SOLN COMPARISON:  CT abdomen/pelvis 10/19/2024, CT chest/abdomen/pelvis 10/07/2024 FINDINGS: CT CHEST FINDINGS Cardiovascular: Right IJ Port-A-Cath unchanged. Mild stable cardiomegaly. Small pericardial effusion unchanged. Mild cardiomediastinal shift to the right unchanged due to volume loss right lung. Calcified plaque over the 3 vessel coronary arteries. Thoracic aorta is normal caliber. There is calcified plaque over the descending thoracic aorta. Pulmonary arterial system and remaining vascular structures are unremarkable. Mediastinum/Nodes: Stable 1 cm right paratracheal lymph node. No other significant mediastinal lymph nodes or left hilar lymph nodes. Lungs/Pleura: Masslike consolidation over the right perihilar region  extending into the right upper lobe/apex unchanged with minimal aeration over the right mid to upper lung unchanged. Scarring over the right lower lobe/lung base unchanged. Very small amount right pleural fluid unchanged. Areas of hyperdensity along the right pleura unchanged likely due to patient's previous pleurodesis. Tiny left pleural effusion. Left lung is otherwise clear. Narrowed right proximal bronchi unchanged. Musculoskeletal: Unchanged. CT ABDOMEN PELVIS FINDINGS Hepatobiliary: Stable 5.4 cm hypodensity over the dome of the liver with several other smaller liver hypodensities unchanged likely due to metastatic disease. Prominence of the intrahepatic bile ducts unchanged. Gallbladder is unremarkable. Pancreas: Unremarkable. Spleen: Normal. Adrenals/Urinary Tract: 1.6 cm right adrenal nodule unchanged possibly metastatic focus. Left adrenal gland normal. Kidneys are normal in size without hydronephrosis or nephrolithiasis. Multiple bilateral renal cysts unchanged. Ureters and bladder are normal. Stomach/Bowel: Stomach and small bowel are normal. Appendix is normal. Colon is unremarkable. Vascular/Lymphatic: Calcified plaque over the abdominal aorta which is normal caliber. Continued evidence of moderate portacaval and celiac axis adenopathy encompassing and mildly narrowing the hepatic artery and causing minimal focal compression of the portal vein. Stable adenopathy just below the SMA and in the periaortic region. Reproductive: Prostate is unremarkable. Other: Slightly more free fluid over the left pericolic gutter and stable small amount of free fluid in the pelvis. Metallic device unchanged over the lower right anterior abdominal wall. Musculoskeletal: No acute findings. IMPRESSION: 1. No acute findings in the chest, abdomen or pelvis. 2. Stable masslike consolidation over the right perihilar region extending into the right upper lobe/apex with minimal aeration over the right mid to upper lung unchanged.  Stable scarring over the right lower lobe/lung base. Very small amount of right pleural fluid unchanged. Tiny left pleural effusion. Sequelae of previous right-sided pleurodesis. 3. Stable 1 cm right paratracheal lymph node. Recommend attention on follow-up. 4. Stable 5.4 cm hypodensity over the dome of the liver with several other smaller  liver hypodensities unchanged likely due to metastatic disease. 5. Stable 1.6 cm right adrenal nodule possibly metastatic focus. 6. Stable moderate portacaval and celiac axis adenopathy encompassing and mildly narrowing the hepatic artery and causing minimal focal compression of the portal vein. Stable adenopathy just below the SMA and in the periaortic region. 7. Slightly more free fluid over the left pericolic gutter and stable small amount of free fluid in the pelvis. 8. Aortic atherosclerosis. Atherosclerotic coronary artery disease. Aortic Atherosclerosis (ICD10-I70.0). Electronically Signed   By: Toribio Agreste M.D.   On: 10/25/2024 11:02   CT Head Wo Contrast Result Date: 10/25/2024 EXAM: CT HEAD WITHOUT CONTRAST 10/25/2024 10:22:54 AM TECHNIQUE: CT of the head was performed without the administration of intravenous contrast. Automated exposure control, iterative reconstruction, and/or weight based adjustment of the mA/kV was utilized to reduce the radiation dose to as low as reasonably achievable. COMPARISON: 10/20/2024. CLINICAL HISTORY: Fall. On anticoag. FINDINGS: BRAIN AND VENTRICLES: No acute hemorrhage. No evidence of acute infarct. No hydrocephalus. No extra-axial collection. No mass effect or midline shift. Known metastatic lesions in left parietal lobe and right cerebellum are occult by CT. Mild age-related volume loss. ORBITS: No acute abnormality. SINUSES: No acute abnormality. SOFT TISSUES AND SKULL: No acute soft tissue abnormality. No skull fracture. IMPRESSION: 1. No acute intracranial abnormality. 2. Known metastatic lesions in the left parietal lobe and  right cerebellum are occult on CT. Electronically signed by: Ryan Chess MD 10/25/2024 11:00 AM EST RP Workstation: [id number]   DG Chest Portable 1 View Result Date: 10/25/2024 CLINICAL DATA:  Fall last night.  History of lung cancer. EXAM: PORTABLE CHEST 1 VIEW COMPARISON:  10/19/2024, 08/13/2024 FINDINGS: Right IJ Port-A-Cath unchanged. Persistent moderate opacification over the right perihilar region and right upper lobe/apex. Aeration over the right mid to lower lung unchanged. Lungs clear. Stable cardiomegaly. Remainder of the exam is unchanged. IMPRESSION: 1. Persistent moderate opacification over the right perihilar region and right upper lobe/apex. Aeration over the right mid to lower lung unchanged. 2. Stable cardiomegaly. Electronically Signed   By: Toribio Agreste M.D.   On: 10/25/2024 09:53   EKG: Independently reviewed.  Sinus tachycardia at 117 bpm.  Nonspecific T wave changes.  Low voltage multiple leads.  QTc prolonged at 586.  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 Hospice patient > Status post chemo, radiation, immunotherapy. > Admission earlier this month for progressive LFT elevation.  CT at that time without obstructive cause.  Appeared to have cholestasis in the setting of malignancy.  Conservative management recommended by GI. > Now was had falls and some confusion at home.  Progressive decline in his function and now LFTs show AST 202, ALT 130, alk phos 854, T. bili  15.8. > Decline in synthetic function.  Patient is on Eliquis  but INR now 2.4 PTT 27.7.  Platelets 123. > Ammonia level was noted to be elevated at 56 as well.  Lactulose ordered in the ED. > Discussions held with the family and patient to be DNR/DNI, pursue  hospice care.  Not full comfort care at this point. - Monitor on telemetry - Consult to palliative medicine - Continue with outpatient attempts at arranging home hospice - Continue home morphine , gabapentin , Eliquis , mirtazapine   - PRN Oxy for Mod/Sev Pain and As needed Dilaudid  for breakthrough severe pain - Supportive care  Hypertension - Not a medication for this  Hyperlipidemia - Holding statin for now  Diabetes - SSI  GERD - Continue PPI  Anemia - Hemoglobin stable 8.0  OSA - CPAP  PE - Continue home Eliquis   COPD Chronic respiratory failure with hypoxia - Continue oxygen  as needed - Continue as needed DuoNebs  DVT prophylaxis: Eliquis  Code Status:   DNR/DNI Family Communication:  Updated by phone/  Disposition Plan:   Patient is from:  Home  Anticipated DC to:  Home versus other hospice  Anticipated DC date:  1 to 2 days  Anticipated DC barriers: None  Consults called:  Telemedicine Admission status:  Observation, telemetry  Severity of Illness: The appropriate patient status for this patient is OBSERVATION. Observation status is judged to be reasonable and necessary in order to provide the required intensity of service to ensure the patient's safety. The patient's presenting symptoms, physical exam findings, and initial radiographic and laboratory data in the context of their medical condition is felt to place them at decreased risk for further clinical deterioration. Furthermore, it is anticipated that the patient will be medically stable for discharge from the hospital within 2 midnights of admission.    Marsa KATHEE Scurry MD [facility]  How to contact the [facility] Attending or Consulting  provider 7A - 7P or covering provider during after hours 7P -7A, for this patient?   Check the care team in [facility] and look for a) attending/consulting [facility] provider listed and b) the [facility] team listed Log into [email or web address] and use [place]'s universal password to access. If you do not have the password, please contact the hospital operator. Locate the [facility] provider you are looking for under [facility] and page to a number that you can be directly reached. If you still have difficulty reaching the provider, please page the [facility] (Director on Call) for the Hospitalists listed on amion for assistance.  10/25/2024, 4:30 PM

## 2024-10-25 NOTE — ED Provider Notes (Addendum)
  EMERGENCY DEPARTMENT AT [facility] Provider Note   CSN: [id number] Arrival date & time: 10/25/24  9141     Patient presents with: Adam Wise is a 73 y.o. male.    Fall Pertinent negatives include no chest pain, no abdominal pain and no shortness of breath.   Patient presents with fall.  Fell 2 times yesterday.  Second time hit his head.  Did not lose consciousness.  Hit his head on the nightstand he believes.  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  Patient was not able to get up off the floor after the second fall so patient laid on the floor for the rest of the night.  Unclear how long he laid on the floor for.  Patient endorsing midthoracic back pain.  Patient said that is chronic in nature but might be slightly worse than baseline.  No bowel bladder incontinence.  No saddle anesthesia.  Is not endorsing any, chest pain or abdominal pain at this time.  Did not hit his abdomen.  Per wife at bedside, has had falls.  Multiple falls.  Four falls since last discharge.  Positive for weakness.  No obvious fevers.  Previous medical history reviewed : Patient was last admitted and discharged in November 2025.  History of metastatic lung cancer currently on chemotherapy.  Last chemotherapy on October 31.  On carboplatin  and etoposide .  Medic to liver and mediastinum.  Chronic back pain and morphine  pump.  Sleep apnea and COPD.  Normal biliary tree on CT imaging.  Cholestatic jaundice likely secondary to tumor infiltration.  GI recommended conservative management.     Prior to Admission medications   Medication Sig Start Date End Date Taking? Authorizing Provider  albuterol  (VENTOLIN  HFA) 108 (90 Base) MCG/ACT inhaler Inhale 2 puffs into the lungs every 4 (four) hours as needed for shortness of breath. 05/30/24   Clark, Katherine K, NP  Black Pepper-Turmeric (TURMERIC COMPLEX/BLACK PEPPER PO) Take 1 tablet by mouth in the morning and at bedtime.     [provider]  Calcium Carb-Cholecalciferol (CALCIUM PLUS VITAMIN D3 PO) Take 1 tablet by mouth in the morning and at bedtime.    [provider]  Coenzyme Q10 (COQ10) 200 MG CAPS Take 200 mg by mouth daily.    [provider]  cyclobenzaprine  (FLEXERIL ) 10 MG tablet Take 1 tablet (10 mg total) by mouth 2 (two) times daily as needed for muscle spasms. 07/29/24   Curatolo, Adam, DO  docusate sodium  (COLACE) 100 MG capsule Take 1 capsule (100 mg total) by mouth every 12 (twelve) hours. Patient taking differently: Take 100 mg by mouth daily as needed for moderate constipation. 08/02/23   Mannie Fairy DASEN, DO  ELIQUIS  5 MG TABS tablet TAKE 1 TABLET BY MOUTH TWICE A DAY 10/11/24   Heilingoetter, Cassandra L, PA-C  esomeprazole (NEXIUM) 20 MG capsule Take 20 mg by mouth in the morning and at bedtime.    [provider]  gabapentin  (NEURONTIN ) 300 MG capsule Take 1 capsule (300 mg total) by mouth 2 (two) times daily. For back pain. 08/10/24   Pickenpack-Cousar, Fannie SAILOR, NP  HYDROcodone  bit-homatropine (HYCODAN) 5-1.5 MG/5ML syrup Take 5 mLs by mouth every 6 (six) hours as needed for cough. 10/19/24   Pickenpack-Cousar, Athena N, NP  insulin  aspart (NOVOLOG  FLEXPEN) 100 UNIT/ML FlexPen Inject 1-9 Units into the skin 3 (three) times daily with meals as needed for high blood sugar (while on chemotherapy).  Patient not taking: Reported on 10/24/2024    [provider]  ipratropium-albuterol  (DUONEB) 0.5-2.5 (3) MG/3ML SOLN Take 3 mLs by nebulization every 6 (six) hours as needed (SOB/Wheezing).    [provider]  KRILL OIL PO Take 1 capsule by mouth daily.    [provider]  lidocaine  (LIDODERM ) 5 % Place 1 patch onto the skin daily. Remove & Discard patch within 12 hours or as directed by MD 09/06/24   Pickenpack-Cousar, Athena N, NP  metFORMIN  (GLUCOPHAGE ) 1000 MG tablet TAKE 1 TABLET (1,000 MG TOTAL) BY MOUTH 2 (TWO) TIMES DAILY WITH A MEAL. FOR  DIABETES 10/11/24   [name], NP  mirtazapine  (REMERON ) 15 MG tablet TAKE 1 TABLET BY MOUTH EVERYDAY AT BEDTIME 02/28/24   Heilingoetter, Cassandra L, PA-C  morphine  (MS CONTIN ) 30 MG 12 hr tablet Take 1 tablet (30 mg total) by mouth every 8 (eight) hours as needed. Patient taking differently: Take 30 mg by mouth every 6 (six) hours as needed for pain. 09/09/24   Pickenpack-Cousar, Athena N, NP  Multiple Vitamin (MULTI-VITAMINS) TABS Take 1 tablet by mouth daily with breakfast.    [provider]  Naloxone  HCl 3 MG/0.1ML LIQD Place 1 spray into both nostrils once. For known/suspected opiod overdose. Every 2-3 minutes in alternating nostril till EMS arrives. 10/06/23   [provider]  ondansetron  (ZOFRAN ) 8 MG tablet Take 1 tablet (8 mg total) by mouth every 8 (eight) hours as needed for nausea or vomiting. 07/11/24   Heilingoetter, Cassandra L, PA-C  oxyCODONE -acetaminophen  (PERCOCET) 10-325 MG tablet Take 1-1.5 tablets by mouth every 4 (four) hours as needed for pain. 09/06/24   Pickenpack-Cousar, Athena N, NP  pioglitazone  (ACTOS ) 45 MG tablet TAKE 1 TABLET (45 MG TOTAL) BY MOUTH DAILY. FOR DIABETES. 08/17/24   Clark, Katherine K, NP  polyethylene glycol powder (GLYCOLAX /MIRALAX ) 17 GM/SCOOP powder Take 17 g by mouth daily as needed for moderate constipation or mild constipation.    [provider]  pravastatin  (PRAVACHOL ) 40 MG tablet TAKE 1 TABLET BY MOUTH EVERY DAY IN THE EVENING FOR CHOLESTEROL 08/17/24   Clark, Katherine K, NP  PRESCRIPTION MEDICATION CPAP- At bedtime    [provider]  testosterone  cypionate (DEPOTESTOSTERONE CYPIONATE) 200 MG/ML injection Inject 200 mg into the muscle every 14 (fourteen) days.    [provider]    Allergies: Bupropion, Hydrochlorothiazide, and Lisinopril     Review of Systems  Constitutional:  Negative for chills and fever.  HENT:  Negative for ear pain and sore throat.   Eyes:  Negative for pain and  visual disturbance.  Respiratory:  Negative for cough and shortness of breath.   Cardiovascular:  Negative for chest pain and palpitations.  Gastrointestinal:  Negative for abdominal pain and vomiting.  Genitourinary:  Negative for dysuria and hematuria.  Musculoskeletal:  Negative for arthralgias and back pain.  Skin:  Negative for color change and rash.  Neurological:  Negative for seizures and syncope.  All other systems reviewed and are negative.   Updated Vital Signs BP (!) 130/90 (BP Location: Right Arm)   Pulse (!) 120   Temp 99 F (37.2 C) (Oral)   Resp 19   Ht 5' 9 (1.753 m)   Wt 99.8 kg   SpO2 94%   BMI 32.49 kg/m   Physical Exam Vitals and nursing note reviewed.  Constitutional:      General: He is not in acute distress.    Appearance: He is well-developed.  HENT:  Head: Normocephalic and atraumatic.  Eyes:     Conjunctiva/sclera: Conjunctivae normal.  Cardiovascular:     Rate and Rhythm: Normal rate and regular rhythm.     Heart sounds: No murmur heard. Pulmonary:     Effort: Pulmonary effort is normal. No respiratory distress.     Breath sounds: Normal breath sounds.  Abdominal:     Palpations: Abdomen is soft.     Tenderness: There is no abdominal tenderness.  Musculoskeletal:        General: No swelling.     Cervical back: Neck supple.  Skin:    General: Skin is warm and dry.     Capillary Refill: Capillary refill takes less than 2 seconds.     Coloration: Skin is jaundiced.  Neurological:     Mental Status: He is alert.     Comments: Alert to name but not place, alert to year but not month   Psychiatric:        Mood and Affect: Mood normal.     (all labs ordered are listed, but only abnormal results are displayed) Labs Reviewed  CBC WITH DIFFERENTIAL/PLATELET - Abnormal; Notable for the following components:      Result Value   RBC 2.56 (*)    Hemoglobin 8.0 (*)    HCT 25.7 (*)    MCV 100.4 (*)    RDW 21.9 (*)    Platelets 123 (*)     Neutro Abs 9.5 (*)    Lymphs Abs 0.2 (*)    Abs Immature Granulocytes 0.10 (*)    All other components within normal limits  COMPREHENSIVE METABOLIC PANEL WITH GFR - Abnormal; Notable for the following components:   Sodium 131 (*)    Chloride 92 (*)    Glucose, Bld 175 (*)    Calcium 8.2 (*)    Total Protein 5.0 (*)    Albumin 1.9 (*)    AST 202 (*)    ALT 130 (*)    Alkaline Phosphatase 854 (*)    Total Bilirubin 15.8 (*)    All other components within normal limits  AMMONIA - Abnormal; Notable for the following components:   Ammonia 56 (*)    All other components within normal limits  PROTIME-INR - Abnormal; Notable for the following components:   Prothrombin Time 27.7 (*)    INR 2.4 (*)    All other components within normal limits  APTT - Abnormal; Notable for the following components:   aPTT 75 (*)    All other components within normal limits  I-STAT CHEM 8, ED - Abnormal; Notable for the following components:   Sodium 133 (*)    Chloride 115 (*)    Glucose, Bld 176 (*)    Calcium, Ion 1.08 (*)    Hemoglobin 10.9 (*)    HCT 32.0 (*)    All other components within normal limits  LIPASE, BLOOD  CK  URINALYSIS, ROUTINE W REFLEX MICROSCOPIC    EKG: EKG Interpretation Date/Time:  Tuesday October 25 2024 09:06:56 EST Ventricular Rate:  117 PR Interval:    QRS Duration:  69 QT Interval:  420 QTC Calculation: 586 R Axis:   58  Text Interpretation: Sinus tachycardia Low voltage, extremity and precordial leads Prolonged QT interval Confirmed by Simon Rea [phone]) on 10/25/2024 9:18:45 AM  Radiology: CT L-SPINE NO CHARGE Result Date: 10/25/2024 EXAM: CT THORACIC AND LUMBAR SPINE WITH INTRAVENOUS CONTRAST 10/25/2024 10:22:54 AM TECHNIQUE: CT of the thoracic and lumbar spine was performed with the  administration of intravenous contrast. Multiplanar reformatted images are provided for review. Automated exposure control, iterative reconstruction, and/or weight based  adjustment of the mA/kV was utilized to reduce the radiation dose to as low as reasonably achievable. Incidental adrenal and/or renal findings do not require follow up imaging. COMPARISON: CT abdomen and pelvis 10/19/2024 and MRI lumbar spine 08/13/2024. CLINICAL HISTORY: Blunt trauma. FINDINGS: THORACIC SPINE: BONES AND ALIGNMENT: Normal vertebral body heights aside from mild chronic anterior wedging of the T11 and T12 vertebral bodies. No acute fracture or suspicious bone lesion. Normal alignment. Spinal cord stimulator lead terminates at the T9 level. DEGENERATIVE CHANGES: Multilevel intervertebral disc height loss throughout the thoracic spine. Multilevel degenerative endplate Schmorl's nodes. Moderate left T9-T10 neural foraminal stenosis. Otherwise, no high-grade thoracic spinal stenosis or other high-grade thoracic neural foraminal narrowing by CT. SOFT TISSUES: Spinal cord stimulator hardware. LUMBAR SPINE: BONES AND ALIGNMENT: Normal vertebral body heights. Chronic bilateral L5 pars interarticularis defects. Demineralization/ lucency of the partially visualized inferior S1 and S2 sacral vertebrae. No acute fracture or suspicious bone lesion. Normal alignment. DEGENERATIVE CHANGES: Multilevel intervertebral disc height loss throughout the lumbar spine. Multilevel degenerative endplate Schmorl's nodes. Multilevel disc bulges and facet arthropathy with up to moderate/ severe spinal stenosis most pronounced at L4-L5 and L3-L4. Up to moderate neuroforaminal stenosis most pronounced at the bilateral L3-L4 level and moderate-to-severe at the bilateral L4-L5 level. Vacuum disc phenomenon at L3-L4 with gas in the superior aspect of the right L3-L4 neural foramen. SOFT TISSUES: Right pulmonary consolidation bilateral pleural effusions right renal hypodensities, atherosclerotic aortoiliac arteries right internal jugular central venous catheter, small pericardial effusion; Please see separately dictated report for  concurrently performed CT chest, abdomen, and pelvis for further evaluation. IMPRESSION: 1. No acute fracture or traumatic malalignment of the thoracic or lumbar spine. 2. Multilevel degenerative changes of the thoracolumbar spine with up to moderate/ severe spinal stenosis at L3-L4 and L4-L5. Severe neural foraminal stenosis at the bilateral L4-L5 and moderate neural foraminal stenosis at the bilateral L3-L4 levels. Findings are seen to better advantage on MRI of the lumbar spine 08/13/2024. Electronically signed by: prentice bybordi 10/25/2024 12:00 PM EST RP Workstation: [id number]   CT T-SPINE NO CHARGE Result Date: 10/25/2024 EXAM: CT THORACIC AND LUMBAR SPINE WITH INTRAVENOUS CONTRAST 10/25/2024 10:22:54 AM TECHNIQUE: CT of the thoracic and lumbar spine was performed with the administration of intravenous contrast. Multiplanar reformatted images are provided for review. Automated exposure control, iterative reconstruction, and/or weight based adjustment of the mA/kV was utilized to reduce the radiation dose to as low as reasonably achievable. Incidental adrenal and/or renal findings do not require follow up imaging. COMPARISON: CT abdomen and pelvis 10/19/2024 and MRI lumbar spine 08/13/2024. CLINICAL HISTORY: Blunt trauma. FINDINGS: THORACIC SPINE: BONES AND ALIGNMENT: Normal vertebral body heights aside from mild chronic anterior wedging of the T11 and T12 vertebral bodies. No acute fracture or suspicious bone lesion. Normal alignment. Spinal cord stimulator lead terminates at the T9 level. DEGENERATIVE CHANGES: Multilevel intervertebral disc height loss throughout the thoracic spine. Multilevel degenerative endplate Schmorl's nodes. Moderate left T9-T10 neural foraminal stenosis. Otherwise, no high-grade thoracic spinal stenosis or other high-grade thoracic neural foraminal narrowing by CT. SOFT TISSUES: Spinal cord stimulator hardware. LUMBAR SPINE: BONES AND ALIGNMENT: Normal vertebral body heights.  Chronic bilateral L5 pars interarticularis defects. Demineralization/ lucency of the partially visualized inferior S1 and S2 sacral vertebrae. No acute fracture or suspicious bone lesion. Normal alignment. DEGENERATIVE CHANGES: Multilevel intervertebral disc height loss throughout the lumbar spine. Multilevel degenerative endplate  Schmorl's nodes. Multilevel disc bulges and facet arthropathy with up to moderate/ severe spinal stenosis most pronounced at L4-L5 and L3-L4. Up to moderate neuroforaminal stenosis most pronounced at the bilateral L3-L4 level and moderate-to-severe at the bilateral L4-L5 level. Vacuum disc phenomenon at L3-L4 with gas in the superior aspect of the right L3-L4 neural foramen. SOFT TISSUES: Right pulmonary consolidation bilateral pleural effusions right renal hypodensities, atherosclerotic aortoiliac arteries right internal jugular central venous catheter, small pericardial effusion; Please see separately dictated report for concurrently performed CT chest, abdomen, and pelvis for further evaluation. IMPRESSION: 1. No acute fracture or traumatic malalignment of the thoracic or lumbar spine. 2. Multilevel degenerative changes of the thoracolumbar spine with up to moderate/ severe spinal stenosis at L3-L4 and L4-L5. Severe neural foraminal stenosis at the bilateral L4-L5 and moderate neural foraminal stenosis at the bilateral L3-L4 levels. Findings are seen to better advantage on MRI of the lumbar spine 08/13/2024. Electronically signed by: prentice bybordi 10/25/2024 12:00 PM EST RP Workstation: [id number]   CT Cervical Spine Wo Contrast Result Date: 10/25/2024 CLINICAL DATA:  Blunt trauma.  Anticoagulation.  Jaundice. EXAM: CT CERVICAL SPINE WITHOUT CONTRAST TECHNIQUE: Multidetector CT imaging of the cervical spine was performed without intravenous contrast. Multiplanar CT image reconstructions were also generated. RADIATION DOSE REDUCTION: This exam was performed according to the  departmental dose-optimization program which includes automated exposure control, adjustment of the mA and/or kV according to patient size and/or use of iterative reconstruction technique. COMPARISON:  10/20/2024 CT cervical spine FINDINGS: Alignment: 2 mm grade 1 degenerative anterolisthesis at C7-T1 is unchanged. Skull base and vertebrae: No fracture or acute bony findings. Loss of intervertebral disc height at all levels between C3 and T1. Soft tissues and spinal canal: Bilateral common carotid atheromatous vascular calcifications. Disc levels: Multilevel intervertebral, uncinate, and facet spurring, resulting in at least moderate left foraminal stenosis at the C3-4 level. Upper chest: Consolidation or pleural fluid at the right lung apex. See dedicated chest CT. Other: Right central line partially observed. IMPRESSION: 1. No acute cervical spine findings. 2. Multilevel cervical spondylosis and degenerative disc disease, causing at least moderate left foraminal stenosis at C3-4. 3. Consolidation or pleural fluid at the right lung apex. See dedicated chest CT. Electronically Signed   By: Ryan Salvage M.D.   On: 10/25/2024 11:05   CT CHEST ABDOMEN PELVIS W CONTRAST Result Date: 10/25/2024 CLINICAL DATA:  Fall today with posterior rib pain bilaterally. Known metastatic small cell lung cancer. EXAM: CT CHEST, ABDOMEN, AND PELVIS WITH CONTRAST TECHNIQUE: Multidetector CT imaging of the chest, abdomen and pelvis was performed following the standard protocol during bolus administration of intravenous contrast. RADIATION DOSE REDUCTION: This exam was performed according to the departmental dose-optimization program which includes automated exposure control, adjustment of the mA and/or kV according to patient size and/or use of iterative reconstruction technique. CONTRAST:  75mL OMNIPAQUE  IOHEXOL  350 MG/ML SOLN COMPARISON:  CT abdomen/pelvis 10/19/2024, CT chest/abdomen/pelvis 10/07/2024 FINDINGS: CT CHEST  FINDINGS Cardiovascular: Right IJ Port-A-Cath unchanged. Mild stable cardiomegaly. Small pericardial effusion unchanged. Mild cardiomediastinal shift to the right unchanged due to volume loss right lung. Calcified plaque over the 3 vessel coronary arteries. Thoracic aorta is normal caliber. There is calcified plaque over the descending thoracic aorta. Pulmonary arterial system and remaining vascular structures are unremarkable. Mediastinum/Nodes: Stable 1 cm right paratracheal lymph node. No other significant mediastinal lymph nodes or left hilar lymph nodes. Lungs/Pleura: Masslike consolidation over the right perihilar region extending into the right upper lobe/apex unchanged with minimal  aeration over the right mid to upper lung unchanged. Scarring over the right lower lobe/lung base unchanged. Very small amount right pleural fluid unchanged. Areas of hyperdensity along the right pleura unchanged likely due to patient's previous pleurodesis. Tiny left pleural effusion. Left lung is otherwise clear. Narrowed right proximal bronchi unchanged. Musculoskeletal: Unchanged. CT ABDOMEN PELVIS FINDINGS Hepatobiliary: Stable 5.4 cm hypodensity over the dome of the liver with several other smaller liver hypodensities unchanged likely due to metastatic disease. Prominence of the intrahepatic bile ducts unchanged. Gallbladder is unremarkable. Pancreas: Unremarkable. Spleen: Normal. Adrenals/Urinary Tract: 1.6 cm right adrenal nodule unchanged possibly metastatic focus. Left adrenal gland normal. Kidneys are normal in size without hydronephrosis or nephrolithiasis. Multiple bilateral renal cysts unchanged. Ureters and bladder are normal. Stomach/Bowel: Stomach and small bowel are normal. Appendix is normal. Colon is unremarkable. Vascular/Lymphatic: Calcified plaque over the abdominal aorta which is normal caliber. Continued evidence of moderate portacaval and celiac axis adenopathy encompassing and mildly narrowing the  hepatic artery and causing minimal focal compression of the portal vein. Stable adenopathy just below the SMA and in the periaortic region. Reproductive: Prostate is unremarkable. Other: Slightly more free fluid over the left pericolic gutter and stable small amount of free fluid in the pelvis. Metallic device unchanged over the lower right anterior abdominal wall. Musculoskeletal: No acute findings. IMPRESSION: 1. No acute findings in the chest, abdomen or pelvis. 2. Stable masslike consolidation over the right perihilar region extending into the right upper lobe/apex with minimal aeration over the right mid to upper lung unchanged. Stable scarring over the right lower lobe/lung base. Very small amount of right pleural fluid unchanged. Tiny left pleural effusion. Sequelae of previous right-sided pleurodesis. 3. Stable 1 cm right paratracheal lymph node. Recommend attention on follow-up. 4. Stable 5.4 cm hypodensity over the dome of the liver with several other smaller liver hypodensities unchanged likely due to metastatic disease. 5. Stable 1.6 cm right adrenal nodule possibly metastatic focus. 6. Stable moderate portacaval and celiac axis adenopathy encompassing and mildly narrowing the hepatic artery and causing minimal focal compression of the portal vein. Stable adenopathy just below the SMA and in the periaortic region. 7. Slightly more free fluid over the left pericolic gutter and stable small amount of free fluid in the pelvis. 8. Aortic atherosclerosis. Atherosclerotic coronary artery disease. Aortic Atherosclerosis (ICD10-I70.0). Electronically Signed   By: Toribio Agreste M.D.   On: 10/25/2024 11:02   CT Head Wo Contrast Result Date: 10/25/2024 EXAM: CT HEAD WITHOUT CONTRAST 10/25/2024 10:22:54 AM TECHNIQUE: CT of the head was performed without the administration of intravenous contrast. Automated exposure control, iterative reconstruction, and/or weight based adjustment of the mA/kV was utilized to  reduce the radiation dose to as low as reasonably achievable. COMPARISON: 10/20/2024. CLINICAL HISTORY: Fall. On anticoag. FINDINGS: BRAIN AND VENTRICLES: No acute hemorrhage. No evidence of acute infarct. No hydrocephalus. No extra-axial collection. No mass effect or midline shift. Known metastatic lesions in left parietal lobe and right cerebellum are occult by CT. Mild age-related volume loss. ORBITS: No acute abnormality. SINUSES: No acute abnormality. SOFT TISSUES AND SKULL: No acute soft tissue abnormality. No skull fracture. IMPRESSION: 1. No acute intracranial abnormality. 2. Known metastatic lesions in the left parietal lobe and right cerebellum are occult on CT. Electronically signed by: Ryan Chess MD 10/25/2024 11:00 AM EST RP Workstation: [id number]   DG Chest Portable 1 View Result Date: 10/25/2024 CLINICAL DATA:  Fall last night.  History of lung cancer. EXAM: PORTABLE CHEST 1 VIEW COMPARISON:  10/19/2024, 08/13/2024 FINDINGS: Right IJ Port-A-Cath unchanged. Persistent moderate opacification over the right perihilar region and right upper lobe/apex. Aeration over the right mid to lower lung unchanged. Lungs clear. Stable cardiomegaly. Remainder of the exam is unchanged. IMPRESSION: 1. Persistent moderate opacification over the right perihilar region and right upper lobe/apex. Aeration over the right mid to lower lung unchanged. 2. Stable cardiomegaly. Electronically Signed   By: Toribio Agreste M.D.   On: 10/25/2024 09:53     Procedures   Medications Ordered in the ED  lactulose (CHRONULAC) 10 GM/15ML solution 20 g (has no administration in time range)  iohexol  (OMNIPAQUE ) 350 MG/ML injection 75 mL (75 mLs Intravenous Contrast Given 10/25/24 1019)  ipratropium-albuterol  (DUONEB) 0.5-2.5 (3) MG/3ML nebulizer solution 3 mL (3 mLs Nebulization Given 10/25/24 1134)                                    Medical Decision Making Amount and/or Complexity of Data Reviewed Labs:  ordered. Radiology: ordered.  Risk Prescription drug management.     HPI:   Patient presents with fall.  Fell 2 times yesterday.  Second time hit his head.  Did not lose consciousness.  Hit his head on the nightstand he believes.  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  Patient was not able to get up off the floor after the second fall so patient laid on the floor for the rest of the night.  Unclear how long he laid on the floor for.  Patient endorsing midthoracic back pain.  Patient said that is chronic in nature but might be slightly worse than baseline.  No bowel bladder incontinence.  No saddle anesthesia.  Is not endorsing any, chest pain or abdominal pain at this time.  Did not hit his abdomen.   Per wife at bedside, has had falls.  Multiple falls.  Four falls since last discharge.  Positive for weakness.  No obvious fevers.  Previous medical history reviewed : Patient was last admitted and discharged in November 2025.  History of metastatic lung cancer currently on chemotherapy.  Last chemotherapy on October 31.  On carboplatin  and etoposide .  Medic to liver and mediastinum.  Chronic back pain and morphine  pump.  Sleep apnea and COPD.  Normal biliary tree on CT imaging.  Cholestatic jaundice likely secondary to tumor infiltration.  GI recommended conservative management.  MDM:   Upon exam, patient alert to self but not place.  Alert to year but not month.  No focal deficits.  Seems to be slightly generalized confusion but patient currently has no family at bedside so hard to tell whether or not this is new or if this is more chronic for him.  In terms of the trauma.  Mechanical fall.  Did hit his head.  On Eliquis .  Will obtain CT head and CT cervical spine.  Patient has chronic thoracic back pain so we will add on a free CT of the thoracic make sure there is no obvious new compression fracture.  Did not hit his abdomen.  Soft benign abdomen.  No concerns from a  traumatic standpoint of any kind of intra-abdominal bleed.  I am obtaining CT scan not because the traumatic pathology but because of the worsening jaundice to assess for any kind of alteration of his biliary tree from his metastatic cancer.  No indication for FAST exam in the situation given no concerns  for free fluid.  Will obtain basic laboratory workup.  Obtain INR as well as ammonia and CMP.    No pain to palpation of shoulders or hips.  Able to move his legs freely.  No concerns for Pelvic fracture.  Will obtain chest x-ray initially.  Reevaluation:   Upon reexamination, patient remains slightly tachycardic.  CT imaging showed no acute fracture.  No evidence of subdural epidural.  Chest showed no acute changes in terms of any kind of infection or any kind of traumatic pathology.  T abdomen pelvis showed ongoing infiltrate in the liver from known metastatic disease.  No obvious CBD dilation.   Patient does have uptrending alk phos as well as total bilirubin.  Likely worsening infiltration of the liver in the setting of metastatic cancer.  Worsening ammonia level. Starting patient on lactulose.    Family at bedside.  Wife as well as daughter.  Pastor at bedside.  We discussed patient's ongoing care.  Discussed the  options about either being admitted to the hospital for worsening ammonia levels as well as worsening bilirubin levels in the setting of this metastatic cancer.  Versus going home.  We could potentially speak to hospice to  set up home management at home if this is the route they would want to proceed.  After discussion, they wanted to proceed with hospice.  They understand that going home would likely mean that patient will pass away probably pretty soon given his metabolic derangements.  Patient as well as family are all aware of this and still prefer this.  Contacted social work.  They will come down to see patient to set up hospice.   Hospice consult placed..  Patient and  family do not feel comfortable going home because they do not have the house ready for hospice.  Have difficulty in terms of hospital bed, bedside commode, inability to get him there safely at this point time.  Asking for hospital inpatient admission until hospice is set up at home.  I think this is reasonable.  Family and patient discussed CODE STATUS.  Made DNR.  They are okay with lactulose in order to help temporize him to get him home.  Okay with antibiotics and okay with CPAP.  Do not want a kind of intervention such as procedures such as central line, dialysis, intubation or chest compressions.   Hospitalist already contacted family.  They will try and set up everything tomorrow.  EKG Interpreted by Me: sinus    Cardiac Tele Interpreted by Me: sinus tachycardia    I have independently interpreted the CXR  and CT  images and agree with the radiologist finding   Social Determinant of Health: End stage cancer    Disposition and Follow Up: Hospice    CRITICAL CARE Performed by: Lavonia LOISE Pat   Total critical care time: 44 minutes  Critical care time was exclusive of separately billable procedures and treating other patients.  Critical care was necessary to treat or prevent imminent or life-threatening deterioration.  Critical care was time spent personally by me on the following activities: development of treatment plan with patient and/or surrogate as well as nursing, discussions with consultants, evaluation of patient's response to treatment, examination of patient, obtaining history from patient or surrogate, ordering and performing treatments and interventions, ordering and review of laboratory studies, ordering and review of radiographic studies, pulse oximetry and re-evaluation of patient's condition.      Final diagnoses:  None    ED Discharge Orders  Ordered    Ambulatory referral to Hospice        10/25/24 1418               Simon Lavonia SAILOR,  MD 10/25/24 1546    Simon Lavonia SAILOR, MD 10/25/24 1556

## 2024-10-25 NOTE — ED Notes (Signed)
 CCMD contacted to place pt on monitor

## 2024-10-26 ENCOUNTER — Telehealth: Payer: Self-pay

## 2024-10-26 ENCOUNTER — Telehealth: Payer: Self-pay | Admitting: *Deleted

## 2024-10-26 DIAGNOSIS — I2510 Atherosclerotic heart disease of native coronary artery without angina pectoris: Secondary | ICD-10-CM | POA: Diagnosis present

## 2024-10-26 DIAGNOSIS — Z66 Do not resuscitate: Secondary | ICD-10-CM | POA: Diagnosis present

## 2024-10-26 DIAGNOSIS — F32A Depression, unspecified: Secondary | ICD-10-CM | POA: Diagnosis present

## 2024-10-26 DIAGNOSIS — Z515 Encounter for palliative care: Secondary | ICD-10-CM | POA: Diagnosis not present

## 2024-10-26 DIAGNOSIS — G9341 Metabolic encephalopathy: Secondary | ICD-10-CM | POA: Diagnosis present

## 2024-10-26 DIAGNOSIS — I10 Essential (primary) hypertension: Secondary | ICD-10-CM | POA: Diagnosis present

## 2024-10-26 DIAGNOSIS — R296 Repeated falls: Secondary | ICD-10-CM | POA: Diagnosis present

## 2024-10-26 DIAGNOSIS — G4733 Obstructive sleep apnea (adult) (pediatric): Secondary | ICD-10-CM | POA: Diagnosis present

## 2024-10-26 DIAGNOSIS — Z7901 Long term (current) use of anticoagulants: Secondary | ICD-10-CM | POA: Diagnosis not present

## 2024-10-26 DIAGNOSIS — K219 Gastro-esophageal reflux disease without esophagitis: Secondary | ICD-10-CM | POA: Diagnosis present

## 2024-10-26 DIAGNOSIS — J449 Chronic obstructive pulmonary disease, unspecified: Secondary | ICD-10-CM | POA: Diagnosis present

## 2024-10-26 DIAGNOSIS — D649 Anemia, unspecified: Secondary | ICD-10-CM | POA: Diagnosis present

## 2024-10-26 DIAGNOSIS — E1169 Type 2 diabetes mellitus with other specified complication: Secondary | ICD-10-CM | POA: Diagnosis present

## 2024-10-26 DIAGNOSIS — E7220 Disorder of urea cycle metabolism, unspecified: Secondary | ICD-10-CM | POA: Diagnosis present

## 2024-10-26 DIAGNOSIS — E278 Other specified disorders of adrenal gland: Secondary | ICD-10-CM | POA: Diagnosis present

## 2024-10-26 DIAGNOSIS — C3411 Malignant neoplasm of upper lobe, right bronchus or lung: Secondary | ICD-10-CM | POA: Diagnosis present

## 2024-10-26 DIAGNOSIS — W19XXXA Unspecified fall, initial encounter: Secondary | ICD-10-CM | POA: Diagnosis present

## 2024-10-26 DIAGNOSIS — J9611 Chronic respiratory failure with hypoxia: Secondary | ICD-10-CM | POA: Diagnosis present

## 2024-10-26 DIAGNOSIS — C7931 Secondary malignant neoplasm of brain: Secondary | ICD-10-CM | POA: Diagnosis present

## 2024-10-26 DIAGNOSIS — R4182 Altered mental status, unspecified: Secondary | ICD-10-CM | POA: Diagnosis present

## 2024-10-26 DIAGNOSIS — K831 Obstruction of bile duct: Secondary | ICD-10-CM | POA: Diagnosis present

## 2024-10-26 DIAGNOSIS — Z6832 Body mass index (BMI) 32.0-32.9, adult: Secondary | ICD-10-CM | POA: Diagnosis not present

## 2024-10-26 DIAGNOSIS — G893 Neoplasm related pain (acute) (chronic): Secondary | ICD-10-CM | POA: Diagnosis present

## 2024-10-26 DIAGNOSIS — Z794 Long term (current) use of insulin: Secondary | ICD-10-CM | POA: Diagnosis not present

## 2024-10-26 DIAGNOSIS — C787 Secondary malignant neoplasm of liver and intrahepatic bile duct: Secondary | ICD-10-CM | POA: Diagnosis present

## 2024-10-26 DIAGNOSIS — Z789 Other specified health status: Secondary | ICD-10-CM

## 2024-10-26 LAB — CBC
HCT: 26.4 % — ABNORMAL LOW (ref 39.0–52.0)
Hemoglobin: 8.3 g/dL — ABNORMAL LOW (ref 13.0–17.0)
MCH: 31.4 pg (ref 26.0–34.0)
MCHC: 31.4 g/dL (ref 30.0–36.0)
MCV: 100 fL (ref 80.0–100.0)
Platelets: 117 K/uL — ABNORMAL LOW (ref 150–400)
RBC: 2.64 MIL/uL — ABNORMAL LOW (ref 4.22–5.81)
RDW: 21.8 % — ABNORMAL HIGH (ref 11.5–15.5)
WBC: 8.7 K/uL (ref 4.0–10.5)
nRBC: 0 % (ref 0.0–0.2)

## 2024-10-26 LAB — COMPREHENSIVE METABOLIC PANEL WITH GFR
ALT: 128 U/L — ABNORMAL HIGH (ref 0–44)
AST: 204 U/L — ABNORMAL HIGH (ref 15–41)
Albumin: 1.9 g/dL — ABNORMAL LOW (ref 3.5–5.0)
Alkaline Phosphatase: 770 U/L — ABNORMAL HIGH (ref 38–126)
Anion gap: 13 (ref 5–15)
BUN: 14 mg/dL (ref 8–23)
CO2: 28 mmol/L (ref 22–32)
Calcium: 8.3 mg/dL — ABNORMAL LOW (ref 8.9–10.3)
Chloride: 95 mmol/L — ABNORMAL LOW (ref 98–111)
Creatinine, Ser: 0.89 mg/dL (ref 0.61–1.24)
GFR, Estimated: >60 mL/min (ref >60)
Glucose, Bld: 165 mg/dL — ABNORMAL HIGH (ref 70–99)
Potassium: 3.8 mmol/L (ref 3.5–5.1)
Sodium: 136 mmol/L (ref 135–145)
Total Bilirubin: 17.4 mg/dL — ABNORMAL HIGH (ref 0.0–1.2)
Total Protein: 5 g/dL — ABNORMAL LOW (ref 6.5–8.1)

## 2024-10-26 MED ORDER — LIDOCAINE 5 % EX PTCH
1.0000 | MEDICATED_PATCH | CUTANEOUS | Status: DC
  Administered 2024-10-26 – 2024-10-27 (×2): 1 via TRANSDERMAL
  Filled 2024-10-26 (×2): qty 1

## 2024-10-26 MED ORDER — HYDROMORPHONE HCL 1 MG/ML IJ SOLN: 0.5000 mg | INTRAMUSCULAR | Status: DC | PRN

## 2024-10-26 MED ORDER — ACETAMINOPHEN 325 MG PO TABS: 650.0000 mg | ORAL_TABLET | Freq: Four times a day (QID) | ORAL | Status: DC | PRN

## 2024-10-26 MED ORDER — SENNOSIDES-DOCUSATE SODIUM 8.6-50 MG PO TABS
1.0000 | ORAL_TABLET | Freq: Two times a day (BID) | ORAL | Status: DC
  Administered 2024-10-26 – 2024-10-27 (×2): 1 via ORAL
  Filled 2024-10-26 (×3): qty 1

## 2024-10-26 NOTE — Telephone Encounter (Signed)
 Received vm from Sherri at [facility]. She stated they had received a referral for hospice for Adam Wise and wanted to know if Dr. Sherrod would be attending of record? Her CB# [phone]

## 2024-10-26 NOTE — Discharge Summary (Addendum)
 Physician Discharge Summary  Adam Wise FMW:[id number] DOB: September 10, 1951 DOA: 10/25/2024  PCP: [name], NP  Admit date: 10/25/2024 Discharge date: 10/27/2024  Admitted from: Home Discharge disposition: Home with hospice  Recommendations at discharge:  Per hospice policy   Subjective: Patient was seen and examined this morning. Somnolent.  Not restless or agitated.  Patient has been running febrile and tachycardic  Brief narrative: Adam Wise is a 73 y.o. 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 Recently hospitalized 11/5 to 11/7 for elevated transaminases, symptomatic anemia.  CT scan at that time showed cholestasis related to malignancy.  Conservative management was recommended by GI and patient was discharged home with home health services Postdischarge, patient became progressively weak, confused at home.  11/11, patient presented to the ED with falls at home The day prior, patient had 2 falls, hit his head on the second fall and passed out.  Was unable to get up and stayed on the floor for the rest of the night only to be found by his wife next morning.  Complained of acute worsening of chronic back pain  In the ED, patient was afebrile, tachycardic, blood pressure stable Skeletal survey negative with chest x-ray, CT head, CT C-spine, CT L-spine, CT T-spine, CT chest abdomen and pelvis.  After discussion with the providers in the ED, patient and family opted for DNR/DNI and plan of hospice care. Patient was kept in observation overnight  Hospital course: Recurrent falls Generalized weakness Secondary to progressive metastatic cancer No fracture on skeletal survey Given progressive progressive cancer, family had shown interest on hospice care Palliative care  and TOC consulted  Acute metabolic encephalopathy Elevated ammonia level Worsening confusion due to cancer, elevated ammonia level Continue to monitor  Lung cancer with metastasis to brain, liver Chronic cancer-related pain Pain regimen --- Scheduled: MS Contin  30 mg twice daily, gabapentin  300 mg twice daily, Remeron  15 mg nightly --- PRN: Oxy IR 10 mg every 4 hours Bowel regimen with Senokot twice daily, Hyperbilirubinemia, transaminitis, hyperammonemia Secondary to liver mets  History of PE Eliquis  has been stopped because of comfort care status  Type 2 diabetes mellitus A1c 6.7 on 07/28/2024 PTA meds-metformin , Actos  Currently on SSI/Accu-Cheks No need to continue medicines or insulin  postdischarge in a Comfort care patient  Goals of care   Code Status: Limited: Do not attempt resuscitation (DNR) -DNR-LIMITED -Do Not Intubate/DNI   Palliative care consult appreciated  Diet:  Diet Order             Diet general           Diet regular Room service appropriate? Yes; Fluid consistency: Thin  Diet effective now                   Nutritional status:  Body mass index is 32.49 kg/m.       Wounds:  -    Discharge Medications:   Allergies as of 10/27/2024       Reactions   Bupropion Other (See Comments), Dermatitis   Racing heart palpitations  Racing heart  palpitations Racing heart palpitations Racing heart    palpitations    Racing heart    palpitations Racing heart palpitations Racing heart    palpitations, Racing heart, palpitations, Racing heart   Hydrochlorothiazide Other (See Comments)   Cramping to lower extremities  Lisinopril  Other (See Comments)   Cough        Medication List     STOP taking these medications    CoQ10 200 MG Caps   docusate sodium  100 MG capsule Commonly known as: COLACE   Eliquis  5 MG Tabs tablet Generic drug: apixaban    esomeprazole 20 MG capsule Commonly known as: NEXIUM   KRILL OIL PO   metFORMIN   1000 MG tablet Commonly known as: GLUCOPHAGE    Naloxone  HCl 3 MG/0.1ML Liqd   NovoLOG  FlexPen 100 UNIT/ML FlexPen Generic drug: insulin  aspart   pioglitazone  45 MG tablet Commonly known as: ACTOS    pravastatin  40 MG tablet Commonly known as: PRAVACHOL    testosterone  cypionate 200 MG/ML injection Commonly known as: DEPOTESTOSTERONE CYPIONATE   TURMERIC COMPLEX/BLACK PEPPER PO       TAKE these medications    acetaminophen  325 MG tablet Commonly known as: TYLENOL  Take 2 tablets (650 mg total) by mouth every 6 (six) hours as needed for mild pain (pain score 1-3) or fever (or Fever >/= 101).   albuterol  108 (90 Base) MCG/ACT inhaler Commonly known as: VENTOLIN  HFA Inhale 2 puffs into the lungs every 4 (four) hours as needed for shortness of breath.   CALCIUM PLUS VITAMIN D3 PO Take 1 tablet by mouth in the morning and at bedtime.   cyclobenzaprine  10 MG tablet Commonly known as: FLEXERIL  Take 1 tablet (10 mg total) by mouth 2 (two) times daily as needed for muscle spasms.   gabapentin  300 MG capsule Commonly known as: NEURONTIN  Take 1 capsule (300 mg total) by mouth 2 (two) times daily. For back pain.   HYDROcodone  bit-homatropine 5-1.5 MG/5ML syrup Commonly known as: Hycodan Take 5 mLs by mouth every 6 (six) hours as needed for cough.   HYDROmorphone  1 MG/ML injection Commonly known as: DILAUDID  Inject 0.5-1 mLs (0.5-1 mg total) into the vein every 4 (four) hours as needed for severe pain (pain score 7-10) (increased work of breathing, RR >25, distress, dyspnea).   ipratropium-albuterol  0.5-2.5 (3) MG/3ML Soln Commonly known as: DUONEB Take 3 mLs by nebulization every 6 (six) hours as needed (SOB/Wheezing).   lidocaine  5 % Commonly known as: LIDODERM  Place 1 patch onto the skin daily. Remove & Discard patch within 12 hours or as directed by MD   mirtazapine  15 MG tablet Commonly known as: REMERON  TAKE 1 TABLET BY MOUTH EVERYDAY AT BEDTIME   morphine  30 MG 12  hr tablet Commonly known as: MS CONTIN  Take 1 tablet (30 mg total) by mouth every 8 (eight) hours as needed.   Multi-Vitamins Tabs Take 1 tablet by mouth daily with breakfast.   ondansetron  8 MG tablet Commonly known as: ZOFRAN  Take 1 tablet (8 mg total) by mouth every 8 (eight) hours as needed for nausea or vomiting.   oxyCODONE -acetaminophen  10-325 MG tablet Commonly known as: Percocet Take 1-1.5 tablets by mouth every 4 (four) hours as needed for pain.   polyethylene glycol powder 17 GM/SCOOP powder Commonly known as: GLYCOLAX /MIRALAX  Take 17 g by mouth daily as needed for moderate constipation or mild constipation.   PRESCRIPTION MEDICATION CPAP- At bedtime               Durable Medical Equipment  (From admission, onward)           Start     Ordered   10/26/24 1348  For home use only DME oxygen   Once       Question Answer Comment  Length of Need Lifetime  Mode or (Route) Nasal cannula   Liters per Minute 2   Frequency Continuous (stationary and portable oxygen  unit needed)   Oxygen  conserving device Yes   Oxygen  delivery system: Gas      10/26/24 1347             Follow ups:    Follow-up Information     [name], NP Follow up.   Specialty: Internal Medicine Contact information: [address] [name] [address] [phone]                 Discharge Instructions:   Discharge Instructions     Activity as tolerated - No restrictions   Complete by: As directed    Ambulatory referral to Hospice   Complete by: As directed    Call MD for:   Complete by: As directed    Please get in touch with hospice MD/nurse for any symptom control.   Diet general   Complete by: As directed    If patient is alert and awake enough to eat, can allow luxury feeding.   Discharge instructions   Complete by: As directed    Medicines intended for comfort and pain management prescribed. Rest of the care per hospice policy.        Discharge Exam:   Vitals:   10/26/24 1650 10/26/24 2021 10/27/24 0059 10/27/24 0456  BP: 110/82 92/74 96/66  100/61  Pulse: (!) 110 (!) 107 (!) 132 (!) 122  Resp: 16 18 18 10   Temp: 97.9 F (36.6 C) 98.5 F (36.9 C) (!) 100.5 F (38.1 C) 99.7 F (37.6 C)  TempSrc: Oral Axillary Axillary Axillary  SpO2: 98% 98% 98% 94%  Weight:      Height:        Body mass index is 32.49 kg/m.  General exam: Pleasant, elderly Caucasian male.  Not in distress Skin: No rashes, lesions or ulcers. HEENT: Atraumatic, normocephalic, no obvious bleeding Lungs: Clear to auscultation bilaterally,  CVS: S1, S2, no murmur,   GI/Abd: Soft, nontender, nondistended, bowel sound present,   CNS: Somnolent Psychiatry: Somnolent Extremities: No pedal edema, no calf tenderness,    The results of significant diagnostics from this hospitalization (including imaging, microbiology, ancillary and laboratory) are listed below for reference.    Procedures and Diagnostic Studies:   CT L-SPINE NO CHARGE Result Date: 10/25/2024 EXAM: CT THORACIC AND LUMBAR SPINE WITH INTRAVENOUS CONTRAST 10/25/2024 10:22:54 AM TECHNIQUE: CT of the thoracic and lumbar spine was performed with the administration of intravenous contrast. Multiplanar reformatted images are provided for review. Automated exposure control, iterative reconstruction, and/or weight based adjustment of the mA/kV was utilized to reduce the radiation dose to as low as reasonably achievable. Incidental adrenal and/or renal findings do not require follow up imaging. COMPARISON: CT abdomen and pelvis 10/19/2024 and MRI lumbar spine 08/13/2024. CLINICAL HISTORY: Blunt trauma. FINDINGS: THORACIC SPINE: BONES AND ALIGNMENT: Normal vertebral body heights aside from mild chronic anterior wedging of the T11 and T12 vertebral bodies. No acute fracture or suspicious bone lesion. Normal alignment. Spinal cord stimulator lead terminates at the T9 level. DEGENERATIVE CHANGES:  Multilevel intervertebral disc height loss throughout the thoracic spine. Multilevel degenerative endplate Schmorl's nodes. Moderate left T9-T10 neural foraminal stenosis. Otherwise, no high-grade thoracic spinal stenosis or other high-grade thoracic neural foraminal narrowing by CT. SOFT TISSUES: Spinal cord stimulator hardware. LUMBAR SPINE: BONES AND ALIGNMENT: Normal vertebral body heights. Chronic bilateral L5 pars interarticularis defects. Demineralization/ lucency of the partially visualized inferior S1  and S2 sacral vertebrae. No acute fracture or suspicious bone lesion. Normal alignment. DEGENERATIVE CHANGES: Multilevel intervertebral disc height loss throughout the lumbar spine. Multilevel degenerative endplate Schmorl's nodes. Multilevel disc bulges and facet arthropathy with up to moderate/ severe spinal stenosis most pronounced at L4-L5 and L3-L4. Up to moderate neuroforaminal stenosis most pronounced at the bilateral L3-L4 level and moderate-to-severe at the bilateral L4-L5 level. Vacuum disc phenomenon at L3-L4 with gas in the superior aspect of the right L3-L4 neural foramen. SOFT TISSUES: Right pulmonary consolidation bilateral pleural effusions right renal hypodensities, atherosclerotic aortoiliac arteries right internal jugular central venous catheter, small pericardial effusion; Please see separately dictated report for concurrently performed CT chest, abdomen, and pelvis for further evaluation. IMPRESSION: 1. No acute fracture or traumatic malalignment of the thoracic or lumbar spine. 2. Multilevel degenerative changes of the thoracolumbar spine with up to moderate/ severe spinal stenosis at L3-L4 and L4-L5. Severe neural foraminal stenosis at the bilateral L4-L5 and moderate neural foraminal stenosis at the bilateral L3-L4 levels. Findings are seen to better advantage on MRI of the lumbar spine 08/13/2024. Electronically signed by: prentice bybordi 10/25/2024 12:00 PM EST RP Workstation:  [id number]   CT T-SPINE NO CHARGE Result Date: 10/25/2024 EXAM: CT THORACIC AND LUMBAR SPINE WITH INTRAVENOUS CONTRAST 10/25/2024 10:22:54 AM TECHNIQUE: CT of the thoracic and lumbar spine was performed with the administration of intravenous contrast. Multiplanar reformatted images are provided for review. Automated exposure control, iterative reconstruction, and/or weight based adjustment of the mA/kV was utilized to reduce the radiation dose to as low as reasonably achievable. Incidental adrenal and/or renal findings do not require follow up imaging. COMPARISON: CT abdomen and pelvis 10/19/2024 and MRI lumbar spine 08/13/2024. CLINICAL HISTORY: Blunt trauma. FINDINGS: THORACIC SPINE: BONES AND ALIGNMENT: Normal vertebral body heights aside from mild chronic anterior wedging of the T11 and T12 vertebral bodies. No acute fracture or suspicious bone lesion. Normal alignment. Spinal cord stimulator lead terminates at the T9 level. DEGENERATIVE CHANGES: Multilevel intervertebral disc height loss throughout the thoracic spine. Multilevel degenerative endplate Schmorl's nodes. Moderate left T9-T10 neural foraminal stenosis. Otherwise, no high-grade thoracic spinal stenosis or other high-grade thoracic neural foraminal narrowing by CT. SOFT TISSUES: Spinal cord stimulator hardware. LUMBAR SPINE: BONES AND ALIGNMENT: Normal vertebral body heights. Chronic bilateral L5 pars interarticularis defects. Demineralization/ lucency of the partially visualized inferior S1 and S2 sacral vertebrae. No acute fracture or suspicious bone lesion. Normal alignment. DEGENERATIVE CHANGES: Multilevel intervertebral disc height loss throughout the lumbar spine. Multilevel degenerative endplate Schmorl's nodes. Multilevel disc bulges and facet arthropathy with up to moderate/ severe spinal stenosis most pronounced at L4-L5 and L3-L4. Up to moderate neuroforaminal stenosis most pronounced at the bilateral L3-L4 level and moderate-to-severe  at the bilateral L4-L5 level. Vacuum disc phenomenon at L3-L4 with gas in the superior aspect of the right L3-L4 neural foramen. SOFT TISSUES: Right pulmonary consolidation bilateral pleural effusions right renal hypodensities, atherosclerotic aortoiliac arteries right internal jugular central venous catheter, small pericardial effusion; Please see separately dictated report for concurrently performed CT chest, abdomen, and pelvis for further evaluation. IMPRESSION: 1. No acute fracture or traumatic malalignment of the thoracic or lumbar spine. 2. Multilevel degenerative changes of the thoracolumbar spine with up to moderate/ severe spinal stenosis at L3-L4 and L4-L5. Severe neural foraminal stenosis at the bilateral L4-L5 and moderate neural foraminal stenosis at the bilateral L3-L4 levels. Findings are seen to better advantage on MRI of the lumbar spine 08/13/2024. Electronically signed by: prentice bybordi 10/25/2024 12:00 PM EST RP  Workstation: [id number]   CT Cervical Spine Wo Contrast Result Date: 10/25/2024 CLINICAL DATA:  Blunt trauma.  Anticoagulation.  Jaundice. EXAM: CT CERVICAL SPINE WITHOUT CONTRAST TECHNIQUE: Multidetector CT imaging of the cervical spine was performed without intravenous contrast. Multiplanar CT image reconstructions were also generated. RADIATION DOSE REDUCTION: This exam was performed according to the departmental dose-optimization program which includes automated exposure control, adjustment of the mA and/or kV according to patient size and/or use of iterative reconstruction technique. COMPARISON:  10/20/2024 CT cervical spine FINDINGS: Alignment: 2 mm grade 1 degenerative anterolisthesis at C7-T1 is unchanged. Skull base and vertebrae: No fracture or acute bony findings. Loss of intervertebral disc height at all levels between C3 and T1. Soft tissues and spinal canal: Bilateral common carotid atheromatous vascular calcifications. Disc levels: Multilevel intervertebral, uncinate,  and facet spurring, resulting in at least moderate left foraminal stenosis at the C3-4 level. Upper chest: Consolidation or pleural fluid at the right lung apex. See dedicated chest CT. Other: Right central line partially observed. IMPRESSION: 1. No acute cervical spine findings. 2. Multilevel cervical spondylosis and degenerative disc disease, causing at least moderate left foraminal stenosis at C3-4. 3. Consolidation or pleural fluid at the right lung apex. See dedicated chest CT. Electronically Signed   By: Ryan Salvage M.D.   On: 10/25/2024 11:05   CT CHEST ABDOMEN PELVIS W CONTRAST Result Date: 10/25/2024 CLINICAL DATA:  Fall today with posterior rib pain bilaterally. Known metastatic small cell lung cancer. EXAM: CT CHEST, ABDOMEN, AND PELVIS WITH CONTRAST TECHNIQUE: Multidetector CT imaging of the chest, abdomen and pelvis was performed following the standard protocol during bolus administration of intravenous contrast. RADIATION DOSE REDUCTION: This exam was performed according to the departmental dose-optimization program which includes automated exposure control, adjustment of the mA and/or kV according to patient size and/or use of iterative reconstruction technique. CONTRAST:  75mL OMNIPAQUE  IOHEXOL  350 MG/ML SOLN COMPARISON:  CT abdomen/pelvis 10/19/2024, CT chest/abdomen/pelvis 10/07/2024 FINDINGS: CT CHEST FINDINGS Cardiovascular: Right IJ Port-A-Cath unchanged. Mild stable cardiomegaly. Small pericardial effusion unchanged. Mild cardiomediastinal shift to the right unchanged due to volume loss right lung. Calcified plaque over the 3 vessel coronary arteries. Thoracic aorta is normal caliber. There is calcified plaque over the descending thoracic aorta. Pulmonary arterial system and remaining vascular structures are unremarkable. Mediastinum/Nodes: Stable 1 cm right paratracheal lymph node. No other significant mediastinal lymph nodes or left hilar lymph nodes. Lungs/Pleura: Masslike  consolidation over the right perihilar region extending into the right upper lobe/apex unchanged with minimal aeration over the right mid to upper lung unchanged. Scarring over the right lower lobe/lung base unchanged. Very small amount right pleural fluid unchanged. Areas of hyperdensity along the right pleura unchanged likely due to patient's previous pleurodesis. Tiny left pleural effusion. Left lung is otherwise clear. Narrowed right proximal bronchi unchanged. Musculoskeletal: Unchanged. CT ABDOMEN PELVIS FINDINGS Hepatobiliary: Stable 5.4 cm hypodensity over the dome of the liver with several other smaller liver hypodensities unchanged likely due to metastatic disease. Prominence of the intrahepatic bile ducts unchanged. Gallbladder is unremarkable. Pancreas: Unremarkable. Spleen: Normal. Adrenals/Urinary Tract: 1.6 cm right adrenal nodule unchanged possibly metastatic focus. Left adrenal gland normal. Kidneys are normal in size without hydronephrosis or nephrolithiasis. Multiple bilateral renal cysts unchanged. Ureters and bladder are normal. Stomach/Bowel: Stomach and small bowel are normal. Appendix is normal. Colon is unremarkable. Vascular/Lymphatic: Calcified plaque over the abdominal aorta which is normal caliber. Continued evidence of moderate portacaval and celiac axis adenopathy encompassing and mildly narrowing the hepatic artery and  causing minimal focal compression of the portal vein. Stable adenopathy just below the SMA and in the periaortic region. Reproductive: Prostate is unremarkable. Other: Slightly more free fluid over the left pericolic gutter and stable small amount of free fluid in the pelvis. Metallic device unchanged over the lower right anterior abdominal wall. Musculoskeletal: No acute findings. IMPRESSION: 1. No acute findings in the chest, abdomen or pelvis. 2. Stable masslike consolidation over the right perihilar region extending into the right upper lobe/apex with minimal  aeration over the right mid to upper lung unchanged. Stable scarring over the right lower lobe/lung base. Very small amount of right pleural fluid unchanged. Tiny left pleural effusion. Sequelae of previous right-sided pleurodesis. 3. Stable 1 cm right paratracheal lymph node. Recommend attention on follow-up. 4. Stable 5.4 cm hypodensity over the dome of the liver with several other smaller liver hypodensities unchanged likely due to metastatic disease. 5. Stable 1.6 cm right adrenal nodule possibly metastatic focus. 6. Stable moderate portacaval and celiac axis adenopathy encompassing and mildly narrowing the hepatic artery and causing minimal focal compression of the portal vein. Stable adenopathy just below the SMA and in the periaortic region. 7. Slightly more free fluid over the left pericolic gutter and stable small amount of free fluid in the pelvis. 8. Aortic atherosclerosis. Atherosclerotic coronary artery disease. Aortic Atherosclerosis (ICD10-I70.0). Electronically Signed   By: Toribio Agreste M.D.   On: 10/25/2024 11:02   CT Head Wo Contrast Result Date: 10/25/2024 EXAM: CT HEAD WITHOUT CONTRAST 10/25/2024 10:22:54 AM TECHNIQUE: CT of the head was performed without the administration of intravenous contrast. Automated exposure control, iterative reconstruction, and/or weight based adjustment of the mA/kV was utilized to reduce the radiation dose to as low as reasonably achievable. COMPARISON: 10/20/2024. CLINICAL HISTORY: Fall. On anticoag. FINDINGS: BRAIN AND VENTRICLES: No acute hemorrhage. No evidence of acute infarct. No hydrocephalus. No extra-axial collection. No mass effect or midline shift. Known metastatic lesions in left parietal lobe and right cerebellum are occult by CT. Mild age-related volume loss. ORBITS: No acute abnormality. SINUSES: No acute abnormality. SOFT TISSUES AND SKULL: No acute soft tissue abnormality. No skull fracture. IMPRESSION: 1. No acute intracranial abnormality. 2.  Known metastatic lesions in the left parietal lobe and right cerebellum are occult on CT. Electronically signed by: Ryan Chess MD 10/25/2024 11:00 AM EST RP Workstation: [id number]   DG Chest Portable 1 View Result Date: 10/25/2024 CLINICAL DATA:  Fall last night.  History of lung cancer. EXAM: PORTABLE CHEST 1 VIEW COMPARISON:  10/19/2024, 08/13/2024 FINDINGS: Right IJ Port-A-Cath unchanged. Persistent moderate opacification over the right perihilar region and right upper lobe/apex. Aeration over the right mid to lower lung unchanged. Lungs clear. Stable cardiomegaly. Remainder of the exam is unchanged. IMPRESSION: 1. Persistent moderate opacification over the right perihilar region and right upper lobe/apex. Aeration over the right mid to lower lung unchanged. 2. Stable cardiomegaly. Electronically Signed   By: Toribio Agreste M.D.   On: 10/25/2024 09:53     Labs:   Basic Metabolic Panel: Recent Labs  Lab 10/21/24 0334 10/25/24 0914 10/25/24 0932 10/26/24 0427  NA 135 133* 131* 136  K 3.8 4.7 3.6 3.8  CL 97* 115* 92* 95*  CO2 29  --  24 28  GLUCOSE 134* 176* 175* 165*  BUN 9 20 15 14   CREATININE 0.70 0.80 0.78 0.89  CALCIUM 8.3*  --  8.2* 8.3*   GFR Estimated Creatinine Clearance: 84.8 mL/min (by C-G formula based on SCr of 0.89 mg/dL).  Liver Function Tests: Recent Labs  Lab 10/21/24 0334 10/25/24 0932 10/26/24 0427  AST 136* 202* 204*  ALT 110* 130* 128*  ALKPHOS 759* 854* 770*  BILITOT 6.2* 15.8* 17.4*  PROT 5.1* 5.0* 5.0*  ALBUMIN 2.7* 1.9* 1.9*   Recent Labs  Lab 10/25/24 0932  LIPASE 21   Recent Labs  Lab 10/25/24 0932  AMMONIA 56*   Coagulation profile Recent Labs  Lab 10/25/24 0932  INR 2.4*    CBC: Recent Labs  Lab 10/21/24 0334 10/25/24 0914 10/25/24 0932 10/26/24 0427  WBC 6.3  --  10.5 8.7  NEUTROABS 5.0  --  9.5*  --   HGB 7.4* 10.9* 8.0* 8.3*  HCT 24.2* 32.0* 25.7* 26.4*  MCV 101.7*  --  100.4* 100.0  PLT 142*  --  123* 117*    Cardiac Enzymes: Recent Labs  Lab 10/25/24 0932  CKTOTAL 64   BNP: Invalid input(s): POCBNP CBG: Recent Labs  Lab 10/20/24 1209 10/20/24 1708 10/20/24 2113 10/21/24 0728 10/21/24 1129  GLUCAP 138* 128* 146* 149* 156*   D-Dimer No results for input(s): DDIMER in the last 72 hours. Hgb A1c No results for input(s): HGBA1C in the last 72 hours. Lipid Profile No results for input(s): CHOL, HDL, LDLCALC, TRIG, CHOLHDL, LDLDIRECT in the last 72 hours. Thyroid  function studies No results for input(s): TSH, T4TOTAL, T3FREE, THYROIDAB in the last 72 hours.  Invalid input(s): FREET3 Anemia work up No results for input(s): VITAMINB12, FOLATE, FERRITIN, TIBC, IRON, RETICCTPCT in the last 72 hours. Microbiology No results found for this or any previous visit (from the past 240 hours).  Time coordinating discharge: 45 minutes  Signed: Rebeckah Masih  [facility] 10/27/2024, 9:10 AM

## 2024-10-26 NOTE — TOC CM/SW Note (Signed)
 Patient for discharge tomorrow with hospice through [facility] . DME being delivered today . Patient was placed on oxygen  after hospice ordered DME. If home oxygen  needed, hospice will need to know today to have it delivered for discharge tomorrow. Have secure chatted  team , MD asked nurse to try to wean oxygen   .   [facility] nurse will be at patient's home tomorrow at 1 pm to admit him to hospice.   Patient to discharge by ambulance .

## 2024-10-26 NOTE — Progress Notes (Signed)
   10/26/24 2021  Assess: MEWS Score  Temp 98.5 F (36.9 C)  BP 92/74  MAP (mmHg) 81  Pulse Rate (!) 107  Resp 18  SpO2 98 %  O2 Device Nasal Cannula  Assess: MEWS Score  MEWS Temp 0  MEWS Systolic 1  MEWS Pulse 1  MEWS RR 0  MEWS LOC 1  MEWS Score 3  MEWS Score Color Yellow  Assess: if the MEWS score is Yellow or Red  Were vital signs accurate and taken at a resting state? Yes  Does the patient meet 2 or more of the SIRS criteria? No  MEWS guidelines implemented  Yes, yellow  Treat  MEWS Interventions Considered administering scheduled or prn medications/treatments as ordered  Take Vital Signs  Increase Vital Sign Frequency  Yellow: Q2hr x1, continue Q4hrs until patient remains green for 12hrs  Escalate  MEWS: Escalate Yellow: Discuss with charge nurse and consider notifying provider and/or RRT  Notify: Charge Nurse/RN  Name of Charge Nurse/RN Notified Games Developer, RN  Assess: SIRS CRITERIA  SIRS Temperature  0  SIRS Respirations  0  SIRS Pulse 1  SIRS WBC 0  SIRS Score Sum  1

## 2024-10-26 NOTE — Plan of Care (Signed)
 MEWS score was yellow at the beginning of shift, vitals signs were monitored with increased frequency. MEWS score was green at about 0330.  Will continue to monitor. Pt given ordered lactulose enema mixed into with 700mL sterile water.  There was about of mixture that was not administered since it appeared that pt was unable to hold anymore of the enema inside. Pt tolerated holding the fluid for 30 minutes before having stool incontinence.  Stool was a brownish-clay color and a medium amount. Problem: Clinical Measurements: Goal: Ability to maintain clinical measurements within normal limits will improve Outcome: Progressing Goal: Will remain free from infection Outcome: Progressing   Problem: Pain Managment: Goal: General experience of comfort will improve and/or be controlled Outcome: Progressing   Problem: Skin Integrity: Goal: Risk for impaired skin integrity will decrease Outcome: Progressing

## 2024-10-26 NOTE — Consult Note (Signed)
 Consultation Note Date: 10/26/2024   Patient Name: Adam Wise  DOB: 22-Apr-1951  MRN: [id number]  Age / Sex: 73 y.o., male  PCP: [name], NP Referring Physician: Arlice Reichert, MD  Reason for Consultation: Transitioning to [facility] care. Follows with cancer pallitive team. Working to arrage hospice at home.   If  HPI/Patient Profile: 73 y.o. 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  After discussions with admitting provider, family wish to pursue hospice care although patient is not full comfort care at this point.  Of note, patient follows with PMT at [facility] cancer center and [facility] outpatient palliative medicine.  Clinical Assessment and Goals of Care: I have reviewed medical records including EPIC notes, labs, any available advanced directives, and imaging. Received report from primary RN -no acute concerns.  Per RN patient has been lethargic this morning.  Discussed patient's case with [facility] PMT provider.  Went to visit patient at bedside - no family/visitors present. Patient was lying in bed asleep-did not attempt to wake to preserve comfort. No signs or non-verbal gestures of pain or discomfort noted. No respiratory distress, increased work of breathing, or secretions noted.  He is on 3 L O2 nasal cannula.  9:50 AM Called patient's wife/[name] -emotional support provided.  She confirms goal is for patient's discharge back home with hospice.  She is hopeful for his discharge later this afternoon after DME delivery.  She informs me the DME is currently  being delivered and she is unable to have long discussion over the phone this morning.  She expresses appreciation for PMT.  Later notified by TOC that oxygen  will also have to be delivered prior to discharge. Now looking at possible discharge tomorrow after oxygen  delivery.   Primary Decision Maker: NEXT OF KIN    SUMMARY OF RECOMMENDATIONS   Gentle treatments with comfort focused care while admitted  Continue DNR/DNI as previously documented - durable DNR form completed and placed in shadow chart. Copy was made and will be scanned into Vynca/ACP tab Goal is for patient's discharge home with hospice after DME delivery, likely tomorrow 11/13 Continue current comfort focused medication regimen as noted below - no changes PMT will continue to follow and support holistically  Symptom Management Dilaudid  as needed for pain/dyspnea/increased work of breathing/RR>25 Tylenol  PRN pain/fever Lidocaine  patch for 12 hours every 24 hours Oxycodone  as needed for moderate pain Continue gabapentin  mirtazapine , and Eliquis  while tolerating p.o.'s Lactulose enema and Senokot scheduled for constipation   Code Status/Advance Care Planning: DNR  Palliative Prophylaxis:  Aspiration, Delirium Protocol, Frequent Pain Assessment, Oral Care, and Turn Reposition  Additional Recommendations (Limitations, Scope, Preferences): No Tracheostomy  Psycho-social/Spiritual:  Desire for further Chaplaincy support:no Created space and opportunity for patient and family to express thoughts and feelings regarding patient's current medical situation.  Emotional support and therapeutic listening provided.  Prognosis:  < 2 weeks  Discharge Planning: Home with Hospice      Primary Diagnoses: Present on Admission:  Small cell lung cancer, right upper lobe (HCC)  OSA (obstructive sleep apnea)  Hyperlipidemia associated with type 2 diabetes mellitus (HCC)  History of pulmonary embolism  GERD (gastroesophageal  reflux disease)  Essential hypertension  COPD (chronic obstructive pulmonary disease) (HCC)  Chronic respiratory failure with  hypoxia (HCC)  Anemia  Chronic bilateral low back pain without sciatica  Hyperbilirubinemia   I have reviewed the medical record, interviewed the patient and family, and examined the patient. The following aspects are pertinent.  Past Medical History:  Diagnosis Date   Acute on chronic respiratory failure with hypoxia (HCC) 10/12/2021   Anxiety    Arthritis    Chickenpox    Chronic knee pain    Chronic low back pain    COPD exacerbation (HCC) 10/11/2021   COPD with exacerbation (HCC) 10/12/2021   Coronary artery calcification seen on CAT scan 11/2021   Coronary CTA 11/27/2021: Moderate to large right pleural effusion and compressive atelectasis right lung base. => Coronary Calcium Score 657.  Diffuse RCA calcification.  Minimal mild disease in the LAD and diagonal branches. == Overall limited study.  Notable artifact.   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 He has disease progression after the first line of chemotherapy in December 2021.   Testosterone  deficiency    Type 2 diabetes mellitus (HCC)    Social History   Socioeconomic History   Marital status: Married    Spouse name: Not on file   Number of children: Not on file   Years of education: Not on file   Highest education level: 12th grade  Occupational History   Not on file  Tobacco Use   Smoking  status: Former    Passive exposure: Never   Smokeless tobacco: Never  Vaping Use   Vaping status: Never Used  Substance and Sexual Activity   Alcohol use: Not Currently    Comment: rarely   Drug use: Not Currently   Sexual activity: Yes  Other Topics Concern   Not on file  Social History Narrative   Married.   Moved from [place].   Retired.   Social Drivers of Corporate Investment Banker Strain: Low Risk  (08/23/2024)   Received [facility]   Overall Financial Resource Strain (CARDIA)    How hard is it for you to pay for the very basics like food, housing, medical care, and heating?: Not hard at all  Food Insecurity: No Food Insecurity (10/25/2024)   Hunger Vital Sign    Worried About Running Out of Food in the Last Year: Never true    Ran Out of Food in the Last Year: Never true  Transportation Needs: No Transportation Needs (10/25/2024)   PRAPARE - Administrator, Civil Service (Medical): No    Lack of Transportation (Non-Medical): No  Physical Activity: Inactive (08/23/2024)   Received from [facility]   Exercise Vital Sign    On average, how many days per week do you engage in moderate to strenuous exercise (like a brisk walk)?: 0 days    Minutes of Exercise per Session: Not on file  Stress: No Stress Concern Present (08/23/2024)   Received from [facility] of Occupational Health - Occupational Stress Questionnaire    Do you feel stress -  tense, restless, nervous, or anxious, or unable to sleep at night because your mind is troubled all the time - these days?: Not at all  Social Connections: Moderately Integrated (10/25/2024)   Social Connection and Isolation Panel    Frequency of Communication with Friends and Family: More than three times a week    Frequency of Social Gatherings with Friends and Family: Once a week    Attends Religious Services: More than 4 times per year    Active Member of [employer] or Organizations: No    Attends Probation Officer: Never    Marital Status: Married   Family History  Problem Relation Age of Onset   Heart attack Mother    Cancer Mother    Hypertension Mother    Arthritis Father    Asthma Father    Cancer Father    COPD Father    Heart attack Father    Hypertension Sister    Cancer Sister    Diabetes Sister    Asthma Sister    Cancer Sister    COPD Sister    Arthritis Sister    Asthma Sister    Diabetes Sister    Birth defects Maternal Grandfather    Arthritis Paternal Grandmother    Diabetes Paternal Grandmother    Arthritis Paternal Grandfather    Asthma Son    Other Neg Hx        pituitary disorder   Scheduled Meds:  apixaban   5 mg Oral BID   Chlorhexidine  Gluconate Cloth  6 each Topical Daily   gabapentin   300 mg Oral BID   lactulose  300 mL Rectal BID   lidocaine   1 patch Transdermal Q24H   mirtazapine   15 mg Oral QHS   morphine   30 mg Oral Q12H   pantoprazole   40 mg Oral BID   senna-docusate  1 tablet Oral BID   sodium chloride  flush  10-40 mL Intracatheter Q12H   sodium chloride  flush  3 mL Intravenous Q12H   Continuous Infusions: PRN Meds:.acetaminophen  **OR** acetaminophen , HYDROmorphone  (DILAUDID ) injection, ipratropium-albuterol , oxyCODONE , sodium chloride  flush Medications Prior to Admission:  Prior to Admission medications   Medication Sig Start Date End Date Taking? Authorizing Provider  albuterol  (VENTOLIN  HFA) 108 (90 Base) MCG/ACT inhaler Inhale 2 puffs into the lungs every 4 (four) hours as needed for shortness of breath. 05/30/24   Clark, Katherine K, NP  Black Pepper-Turmeric (TURMERIC COMPLEX/BLACK PEPPER PO) Take 1 tablet by mouth in the morning and at bedtime.    [provider]  Calcium Carb-Cholecalciferol (CALCIUM PLUS VITAMIN D3 PO) Take 1 tablet by mouth in the morning and at bedtime.    [provider]  Coenzyme Q10 (COQ10) 200 MG CAPS Take 200 mg by mouth daily.    [provider]  cyclobenzaprine   (FLEXERIL ) 10 MG tablet Take 1 tablet (10 mg total) by mouth 2 (two) times daily as needed for muscle spasms. 07/29/24   Curatolo, Adam, DO  docusate sodium  (COLACE) 100 MG capsule Take 1 capsule (100 mg total) by mouth every 12 (twelve) hours. Patient taking differently: Take 100 mg by mouth daily as needed for moderate constipation. 08/02/23   Mannie Pac T, DO  ELIQUIS  5 MG TABS tablet TAKE 1 TABLET BY MOUTH TWICE A DAY 10/11/24   Heilingoetter, Cassandra L, PA-C  esomeprazole (NEXIUM) 20 MG capsule Take 20 mg by mouth in the morning and at bedtime.    [provider]  gabapentin  (NEURONTIN ) 300 MG capsule  Take 1 capsule (300 mg total) by mouth 2 (two) times daily. For back pain. 08/10/24   Pickenpack-Cousar, Fannie SAILOR, NP  HYDROcodone  bit-homatropine (HYCODAN) 5-1.5 MG/5ML syrup Take 5 mLs by mouth every 6 (six) hours as needed for cough. 10/19/24   Pickenpack-Cousar, Athena N, NP  insulin  aspart (NOVOLOG  FLEXPEN) 100 UNIT/ML FlexPen Inject 1-9 Units into the skin 3 (three) times daily with meals as needed for high blood sugar (while on chemotherapy). Patient not taking: Reported on 10/24/2024    [provider]  ipratropium-albuterol  (DUONEB) 0.5-2.5 (3) MG/3ML SOLN Take 3 mLs by nebulization every 6 (six) hours as needed (SOB/Wheezing).    [provider]  KRILL OIL PO Take 1 capsule by mouth daily.    [provider]  lidocaine  (LIDODERM ) 5 % Place 1 patch onto the skin daily. Remove & Discard patch within 12 hours or as directed by MD 09/06/24   Pickenpack-Cousar, Fannie SAILOR, NP  metFORMIN  (GLUCOPHAGE ) 1000 MG tablet TAKE 1 TABLET (1,000 MG TOTAL) BY MOUTH 2 (TWO) TIMES DAILY WITH A MEAL. FOR DIABETES 10/11/24   [name], NP  mirtazapine  (REMERON ) 15 MG tablet TAKE 1 TABLET BY MOUTH EVERYDAY AT BEDTIME 02/28/24   Heilingoetter, Cassandra L, PA-C  morphine  (MS CONTIN ) 30 MG 12 hr tablet Take 1 tablet (30 mg total) by mouth every 8 (eight) hours as  needed. Patient taking differently: Take 30 mg by mouth every 6 (six) hours as needed for pain. 09/09/24   Pickenpack-Cousar, Athena N, NP  Multiple Vitamin (MULTI-VITAMINS) TABS Take 1 tablet by mouth daily with breakfast.    [provider]  Naloxone  HCl 3 MG/0.1ML LIQD Place 1 spray into both nostrils once. For known/suspected opiod overdose. Every 2-3 minutes in alternating nostril till EMS arrives. 10/06/23   [provider]  ondansetron  (ZOFRAN ) 8 MG tablet Take 1 tablet (8 mg total) by mouth every 8 (eight) hours as needed for nausea or vomiting. 07/11/24   Heilingoetter, Cassandra L, PA-C  oxyCODONE -acetaminophen  (PERCOCET) 10-325 MG tablet Take 1-1.5 tablets by mouth every 4 (four) hours as needed for pain. 09/06/24   Pickenpack-Cousar, Athena N, NP  pioglitazone  (ACTOS ) 45 MG tablet TAKE 1 TABLET (45 MG TOTAL) BY MOUTH DAILY. FOR DIABETES. 08/17/24   Clark, Katherine K, NP  polyethylene glycol powder (GLYCOLAX /MIRALAX ) 17 GM/SCOOP powder Take 17 g by mouth daily as needed for moderate constipation or mild constipation.    [provider]  pravastatin  (PRAVACHOL ) 40 MG tablet TAKE 1 TABLET BY MOUTH EVERY DAY IN THE EVENING FOR CHOLESTEROL 08/17/24   Clark, Katherine K, NP  PRESCRIPTION MEDICATION CPAP- At bedtime    [provider]  testosterone  cypionate (DEPOTESTOSTERONE CYPIONATE) 200 MG/ML injection Inject 200 mg into the muscle every 14 (fourteen) days.    [provider]   Allergies  Allergen Reactions   Bupropion Other (See Comments) and Dermatitis    Racing heart  palpitations  Racing heart  palpitations Racing heart palpitations Racing heart    palpitations    Racing heart    palpitations Racing heart palpitations Racing heart    palpitations, Racing heart, palpitations, Racing heart   Hydrochlorothiazide Other (See Comments)    Cramping to lower extremities   Lisinopril      Cough    Review of Systems  Unable to perform ROS:  Acuity of condition    Physical Exam Vitals and nursing note reviewed.  Constitutional:      General: He is not in acute distress.  Appearance: He is ill-appearing.  Pulmonary:     Effort: No respiratory distress.  Skin:    General: Skin is warm and dry.  Neurological:     Mental Status: He is lethargic.     Motor: Weakness present.     Vital Signs: BP 122/83   Pulse (!) 117   Temp 97.9 F (36.6 C)   Resp 18   Ht 5' 9 (1.753 m)   Wt 99.8 kg   SpO2 98%   BMI 32.49 kg/m  Pain Scale: 0-10   Pain Score: 4    SpO2: SpO2: 98 % O2 Device:SpO2: 98 % O2 Flow Rate: .O2 Flow Rate (L/min): 3 L/min  IO: Intake/output summary:  Intake/Output Summary (Last 24 hours) at 10/26/2024 0816 Last data filed at 10/25/2024 2131 Gross per 24 hour  Intake 60 ml  Output 200 ml  Net -140 ml    LBM: Last BM Date : 10/25/24 Baseline Weight: Weight: 99.8 kg Most recent weight: Weight: 99.8 kg     Palliative Assessment/Data: PPS 20%     Time In: 0915 Time Out: 1015 Time Total: 60 minutes  Signed by: Jeoffrey CHRISTELLA Sharps, NP   Please contact Palliative Medicine Team phone at [phone] for questions and concerns.  For individual provider: See Amion  *Portions of this note are a verbal dictation therefore any spelling and/or grammatical errors are due to the Dragon Medical One system interpretation.

## 2024-10-26 NOTE — Telephone Encounter (Signed)
 Fax sent successfully to [facility] signed by Calton DEL.

## 2024-10-27 MED ORDER — HYDROMORPHONE HCL 1 MG/ML IJ SOLN: 0.5000 mg | INTRAMUSCULAR | Status: DC | PRN

## 2024-10-27 MED ORDER — HEPARIN SOD (PORK) LOCK FLUSH 100 UNIT/ML IV SOLN
500.0000 [IU] | INTRAVENOUS | Status: AC | PRN
  Administered 2024-10-27: 500 [IU]

## 2024-10-27 NOTE — Plan of Care (Signed)
  Problem: Pain Managment: Goal: General experience of comfort will improve and/or be controlled Outcome: Progressing   Problem: Safety: Goal: Ability to remain free from injury will improve Outcome: Progressing   Problem: Skin Integrity: Goal: Risk for impaired skin integrity will decrease Outcome: Progressing

## 2024-10-27 NOTE — Progress Notes (Signed)
 Called IV team to deaccess PAC prior to DC. AVS copied and given to PTAR.

## 2024-10-27 NOTE — Progress Notes (Signed)
 Patient was prepared for discharge home with hospice services yesterday but unable to discharge because of pending hospice arrangements at home. No change overnight. No acute discharge today [facility] will not bill for today

## 2024-10-27 NOTE — Progress Notes (Signed)
 Daily Progress Note   Patient Name: Adam Wise       Date: 10/27/2024 DOB: 1951/01/16  Age: 73 y.o. MRN#: [id number] Attending Physician: Arlice Reichert, MD Primary Care Physician: [name], NP Admit Date: 10/25/2024  Reason for Consultation/Follow-up: Establishing goals of care  Subjective: I have reviewed medical records including EPIC notes, MAR, and labs.  Fever noted overnight of 100.5.  Unable to receive report from primary RN.  Went to visit patient at bedside -daughter present along with nonemergent transport.  Patient is discharging home this morning with hospice care.  Patient is sitting up in the bed eating breakfast and expresses readiness to return home. No signs or non-verbal gestures of pain or discomfort noted. No respiratory distress, increased work of breathing, or secretions noted.  He appears stable for transfer.  Emotional support provided to daughter - she has no further questions or acute PMT needs.    Length of Stay: 1  Current Medications: Scheduled Meds:   apixaban   5 mg Oral BID   Chlorhexidine  Gluconate Cloth  6 each Topical Daily   gabapentin   300 mg Oral BID   lactulose  300 mL Rectal BID   lidocaine   1 patch Transdermal Q24H   mirtazapine   15 mg Oral QHS   morphine   30 mg Oral Q12H   pantoprazole   40 mg Oral BID   senna-docusate  1 tablet Oral BID   sodium chloride  flush  10-40 mL Intracatheter Q12H   sodium chloride  flush  3 mL Intravenous Q12H    Continuous Infusions:   PRN Meds: acetaminophen  **OR** acetaminophen , HYDROmorphone  (DILAUDID ) injection, ipratropium-albuterol , oxyCODONE , sodium chloride  flush  Physical Exam Vitals and nursing note reviewed.  Constitutional:      General: He is not in acute distress.    Appearance:  He is ill-appearing.  Pulmonary:     Effort: No respiratory distress.  Skin:    General: Skin is warm and dry.  Neurological:     Mental Status: He is alert.     Motor: Weakness present.  Psychiatric:        Attention and Perception: Attention normal.        Behavior: Behavior is cooperative.             Vital Signs: BP 100/61 (BP Location: Right Arm)   Pulse ROLLEN)  122   Temp 99.7 F (37.6 C) (Axillary)   Resp 10   Ht 5' 9 (1.753 m)   Wt 99.8 kg   SpO2 94%   BMI 32.49 kg/m  SpO2: SpO2: 94 % O2 Device: O2 Device: Nasal Cannula O2 Flow Rate: O2 Flow Rate (L/min): 2.5 L/min  Intake/output summary:  Intake/Output Summary (Last 24 hours) at 10/27/2024 1016 Last data filed at 10/27/2024 1004 Gross per 24 hour  Intake 70 ml  Output --  Net 70 ml   LBM: Last BM Date : 10/25/24 Baseline Weight: Weight: 99.8 kg Most recent weight: Weight: 99.8 kg       Palliative Assessment/Data: PPS 40%      Patient Active Problem List   Diagnosis Date Noted   Advanced directive placed in chart this admission 10/26/2024   Hyperbilirubinemia 10/25/2024   Bilirubinemia 10/19/2024   Acute on chronic back pain 07/31/2024   Anemia 02/01/2024   Other pancytopenia (HCC) 02/01/2024   Tachycardia, unspecified 01/04/2024   Nasal congestion 01/04/2024   Cancer related pain 08/11/2023   Weakness 08/10/2023   General weakness 08/10/2023   Dizziness 11/12/2022   Chronic respiratory failure with hypoxia (HCC) 09/18/2022   Recurrent pleural effusion on right 01/16/2022   Preop cardiovascular exam 12/19/2021   Coronary Calcium Score 657 11/13/2021   Atypical chest pain 11/13/2021   COPD (chronic obstructive pulmonary disease) (HCC) 10/12/2021   Erectile dysfunction 10/26/2020   Port-A-Cath in place 06/11/2020   History of pulmonary embolism 05/15/2020   Small cell lung cancer, right upper lobe (HCC) 04/12/2020   Chronic fatigue 04/12/2020   Encounter for antineoplastic chemotherapy  04/12/2020   Encounter for antineoplastic immunotherapy 04/12/2020   Malignant neoplasm of upper lobe of right lung (HCC) 04/04/2020   Goals of care, counseling/discussion 03/30/2020   Exertional dyspnea 03/26/2020   Hyperlipidemia associated with type 2 diabetes mellitus (HCC) 11/08/2019   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  After discussions with admitting provider, family wish to pursue hospice care although patient is not full comfort care at this point.   Of note, patient follows with PMT at [facility] cancer center and [facility] outpatient palliative medicine.  Assessment: Principal Problem:   Hyperbilirubinemia Active Problems:   Essential hypertension   Type 2 diabetes mellitus (HCC)   GERD (gastroesophageal reflux disease)   OSA (obstructive sleep apnea)   Chronic bilateral low back pain without sciatica   Hyperlipidemia associated with type 2 diabetes mellitus (HCC)   Small cell lung cancer, right upper lobe (HCC)   History of pulmonary embolism   COPD (chronic obstructive pulmonary disease) (HCC)   Chronic respiratory failure with hypoxia (HCC)   Anemia   Advanced directive placed in chart this admission   Concern about end of  life  Recommendations/Plan: Discharge home with hospice today - patient stable for transfer No further acute PMT  needs  Goals of Care and Additional Recommendations: Limitations on Scope of Treatment: Avoid Hospitalization  Code Status:    Code Status Orders  (From admission, onward)           Start     Ordered   10/25/24 1628  Do not attempt resuscitation (DNR)- Limited -Do Not Intubate (DNI)  Continuous       Question Answer Comment  If pulseless and not breathing No CPR or chest compressions.   In Pre-Arrest Conditions (Patient Is Breathing and Has A Pulse) Do not intubate. Provide all appropriate non-invasive medical interventions. Avoid ICU transfer unless indicated or required.   Consent: Discussion documented in EHR or advanced directives reviewed      10/25/24 1645           Code Status History     Date Active Date Inactive Code Status Order ID Comments User Context   10/25/2024 1505 10/25/2024 1645 Do not attempt resuscitation (DNR) PRE-ARREST INTERVENTIONS DESIRED [id number]  Simon Lavonia SAILOR, MD ED   10/19/2024 1656 10/21/2024 2121 Full Code [id number]  Raenelle Coria, MD ED   07/31/2024 2212 08/02/2024 1822 Full Code [id number]  Tobie Jorie SAUNDERS, MD ED   08/10/2023 2150 08/15/2023 1826 Full Code [id number]  Laveda Roosevelt, MD ED   01/01/2023 1508 01/02/2023 0521 Full Code [id number]  Philip Cornet, MD HOV   09/01/2022 1819 09/02/2022 1821 Full Code [id number]  Alaine Vicenta NOVAK, MD Inpatient   10/11/2021 2156 10/13/2021 2200 Full Code [id number]  Rockey Denece LABOR, DO ED   05/15/2020 2107 05/17/2020 1822 Full Code [id number]  Franky Redia SAILOR, MD ED      Advance Directive Documentation    Flowsheet Row Most Recent Value  Type of Advance Directive Living will  Pre-existing out of facility DNR order (yellow form or pink MOST form) --  MOST Form in Place? --    Prognosis:  < 6 months  Discharge Planning: Home with Hospice  Care plan was discussed with patient, patient's  family  Thank you for allowing the Palliative Medicine Team to assist in the care of this patient.   Total Time 25 minutes Prolonged Time Billed  no       Jeoffrey CHRISTELLA Sharps, NP  Please contact Palliative Medicine Team phone at [phone] for questions and concerns.   *Portions of this note are a verbal dictation therefore any spelling and/or grammatical errors are due to the Dragon Medical One system interpretation.

## 2024-10-27 NOTE — TOC Progression Note (Addendum)
 Transition of Care (TOC) - Progression Note   Spoke to patient and daughter [name] at bedside. Patient's wife has an appointment this morning and will be done by 1 pm.   [name] unsure if hospice nurse is planning to visit at home at 1 pm or 2 pm today. NCM secure chatted Melissa with [facility] await response. Daughter asking for ambulance to be scheduled for pick at noon or 1pm. NCM explained NCM can request a time but as day goes on [facility] may get busy and have people ahead . Patient and daughter voiced understanding   Melissa with [facility] asking for patient to be home by 1pm for their nurse to visit at 2 pm. This is the only appointment they have today. Patient and daughter aware and ready for transport. [facility] called estimated time of arrival for pick is within the hour. Patient, daughter, nurse and Melissa aware.   [facility] paperwork and DNR on chart    Patient Details  Name: Adam Wise MRN: [id number] Date of Birth: 1951-09-24  Transition of Care [facility]) CM/SW Contact  [name], Powell Jansky, RN Phone Number: 10/27/2024, 9:44 AM  Clinical Narrative:                         Expected Discharge Plan and Services         Expected Discharge Date: 10/27/24                                     Social Drivers of Health (SDOH) Interventions SDOH Screenings   Food Insecurity: No Food Insecurity (10/25/2024)  Housing: Low Risk  (10/25/2024)  Transportation Needs: No Transportation Needs (10/25/2024)  Utilities: Not At Risk (10/25/2024)  Alcohol Screen: Low Risk  (03/17/2024)  Depression (PHQ2-9): Low Risk  (10/24/2024)  Financial Resource Strain: Low Risk  (08/23/2024)   Received from [facility]  Physical Activity: Inactive (08/23/2024)   Received from [facility]  Social Connections: Moderately Integrated (10/25/2024)  Stress: No Stress Concern Present (08/23/2024)   Received from [facility]  Tobacco Use: Medium Risk (10/25/2024)  Health Literacy: Adequate  Health Literacy (03/17/2024)    Readmission Risk Interventions    08/13/2023    2:05 PM  Readmission Risk Prevention Plan  Transportation Screening Complete  HRI or Home Care Consult Complete  Social Work Consult for Recovery Care Planning/Counseling Complete  Palliative Care Screening Not Applicable  Medication Review Oceanographer) Complete

## 2024-10-27 NOTE — Progress Notes (Signed)
   10/27/24 0409  Assess: if the MEWS score is Yellow or Red  Were vital signs accurate and taken at a resting state? Yes  Does the patient meet 2 or more of the SIRS criteria? No  MEWS guidelines implemented  Yes, red  Treat  MEWS Interventions Considered administering scheduled or prn medications/treatments as ordered  Take Vital Signs  Increase Vital Sign Frequency  Red: Q1hr x2, continue Q4hrs until patient remains green for 12hrs  Escalate  MEWS: Escalate Red: Discuss with charge nurse and notify provider. Consider notifying RRT. If remains red for 2 hours consider need for higher level of care  Notify: Charge Nurse/RN  Name of Charge Nurse/RN Notified [place], RN  Provider Notification  Provider Name/Title A. Andrez, NP  Date Provider Notified 10/27/24  Time Provider Notified [phone]  Method of Notification Page  Notification Reason Other (Comment) (red MEWS)  Provider response Other (Comment) (asked about pt's HR)  Date of Provider Response 10/27/24  Time of Provider Response [phone]

## 2024-10-28 ENCOUNTER — Telehealth: Payer: Self-pay

## 2024-10-28 NOTE — Transitions of Care (Post Inpatient/ED Visit) (Signed)
   10/28/2024  Name: Adam Wise MRN: [id number] DOB: [birth year]  Today's TOC FU Call Status: Today's TOC FU Call Status:: Successful TOC FU Call Completed TOC FU Call Complete Date: 10/28/24  Patient's Name and Date of Birth confirmed. Name, DOB (HIPAA verified by wife/ dpr, Rollene Hope)  Transition Care Management Follow-up Telephone Call Date of Discharge: 10/27/24 Discharge Facility: [facility]) Type of Discharge: Inpatient Admission Primary Inpatient Discharge Diagnosis:: disorientation Hepatic encephalopathy  RN made outreach to patient/family and confirmed that hospice services are in place.  Patient/family aware to contact hospice 24/7 for any questions or concerns. No further interventions at this time.       Arvin Seip RN, BSN, CCM [employer], Population Health Case Manager Phone: [phone]

## 2024-11-01 ENCOUNTER — Other Ambulatory Visit

## 2024-11-01 ENCOUNTER — Other Ambulatory Visit: Payer: Self-pay | Admitting: Physician Assistant

## 2024-11-01 ENCOUNTER — Telehealth: Payer: Self-pay

## 2024-11-01 NOTE — Telephone Encounter (Signed)
 Faxed signed documents to [facility] with confirmation at [phone].

## 2024-11-07 ENCOUNTER — Ambulatory Visit

## 2024-11-08 ENCOUNTER — Ambulatory Visit: Admitting: Urology

## 2024-11-08 NOTE — Progress Notes (Signed)
------------------------------------------------------------------------------- °  Summary: patient expired -------------------------------------------------------------------------------  Wife called to thank staff at [facility] that patient had died at the [facility] in [place].  Wife stated that he had been seen in [place] and liver mets had taken over and patient had turned orange.  He had some confusion with the jaundice but when he became combative and agitated they took him to the hospice home.  He passed away on 2024-12-16 night - his wife was very appreciative of all the help, support and love we gave to him.   Support and encouragement to wife.  Encouraged to reach out to us  if any further needs.

## 2024-11-14 NOTE — Progress Notes (Incomplete)
  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  The patient {Desc; did/not:3044021} note fatigue during radiation. The patient {Desc; did/not:3044021} note hair loss or skin changes in the field of radiation during therapy. The patient {ACTION; IS/IS WNU:[id number]} taking dexamethasone . The patient {DOES_DOES WNU:81435} have symptoms of  weakness or loss of control of the extremities. The patient {DOES_DOES WNU:81435} have symptoms of headache. The patient {DOES_DOES WNU:81435} have symptoms of seizure or uncontrolled movement. The patient {DOES_DOES WNU:81435} have symptoms of changes in vision. The patient {DOES_DOES WNU:81435} have changes in speech. The patient {DOES_DOES WNU:81435} have confusion.   The patient was counseled that he  will be contacted by our brain and spine navigator to schedule surveillance imaging. The patient was encouraged to call if he has not received a call to schedule imaging, or if he  develop concerns or questions regarding radiation. The patient will also continue to follow up with Dr. Sherrod in medical oncology.

## 2024-11-14 DEATH — deceased

## 2024-12-02 ENCOUNTER — Ambulatory Visit: Admitting: Nurse Practitioner

## 2025-01-09 ENCOUNTER — Ambulatory Visit: Admitting: Radiation Oncology

## 2025-01-09 ENCOUNTER — Inpatient Hospital Stay

## 2025-03-20 ENCOUNTER — Ambulatory Visit: Payer: PRIVATE HEALTH INSURANCE
